# Patient Record
Sex: Female | Born: 1945
Health system: Southern US, Community
[De-identification: ages and names within clinical notes are randomized; demographics above are authoritative.]

## PROBLEM LIST (undated history)

## (undated) DIAGNOSIS — M1990 Unspecified osteoarthritis, unspecified site: Secondary | ICD-10-CM

## (undated) DIAGNOSIS — R000 Tachycardia, unspecified: Secondary | ICD-10-CM

## (undated) DIAGNOSIS — I491 Atrial premature depolarization: Secondary | ICD-10-CM

## (undated) DIAGNOSIS — Z8679 Personal history of other diseases of the circulatory system: Secondary | ICD-10-CM

## (undated) DIAGNOSIS — I509 Heart failure, unspecified: Secondary | ICD-10-CM

## (undated) DIAGNOSIS — I471 Supraventricular tachycardia, unspecified: Secondary | ICD-10-CM

## (undated) DIAGNOSIS — N179 Acute kidney failure, unspecified: Secondary | ICD-10-CM

## (undated) DIAGNOSIS — R002 Palpitations: Secondary | ICD-10-CM

## (undated) DIAGNOSIS — R011 Cardiac murmur, unspecified: Secondary | ICD-10-CM

## (undated) DIAGNOSIS — I10 Essential (primary) hypertension: Secondary | ICD-10-CM

## (undated) DIAGNOSIS — R609 Edema, unspecified: Secondary | ICD-10-CM

## (undated) DIAGNOSIS — I493 Ventricular premature depolarization: Secondary | ICD-10-CM

## (undated) DIAGNOSIS — E119 Type 2 diabetes mellitus without complications: Secondary | ICD-10-CM

## (undated) DIAGNOSIS — J189 Pneumonia, unspecified organism: Secondary | ICD-10-CM

## (undated) DIAGNOSIS — K219 Gastro-esophageal reflux disease without esophagitis: Secondary | ICD-10-CM

## (undated) DIAGNOSIS — I350 Nonrheumatic aortic (valve) stenosis: Secondary | ICD-10-CM

## (undated) HISTORY — DX: Cardiac murmur, unspecified: R01.1

## (undated) HISTORY — DX: Supraventricular tachycardia, unspecified: I47.10

## (undated) HISTORY — DX: Unspecified osteoarthritis, unspecified site: M19.90

## (undated) HISTORY — DX: Supraventricular tachycardia: I47.1

## (undated) HISTORY — DX: Edema, unspecified: R60.9

## (undated) HISTORY — DX: Atrial premature depolarization: I49.1

## (undated) HISTORY — DX: Tachycardia, unspecified: R00.0

## (undated) HISTORY — DX: Palpitations: R00.2

## (undated) HISTORY — DX: Personal history of other diseases of the circulatory system: Z86.79

## (undated) HISTORY — DX: Essential (primary) hypertension: I10

## (undated) HISTORY — DX: Ventricular premature depolarization: I49.3

## (undated) HISTORY — DX: Morbid (severe) obesity due to excess calories: E66.01

## (undated) HISTORY — DX: Nonrheumatic aortic (valve) stenosis: I35.0

---

## 2000-06-11 ENCOUNTER — Emergency Department (HOSPITAL_COMMUNITY): Admission: EM | Admit: 2000-06-11 | Discharge: 2000-06-11 | Payer: Self-pay | Admitting: Emergency Medicine

## 2001-02-21 ENCOUNTER — Emergency Department (HOSPITAL_COMMUNITY): Admission: EM | Admit: 2001-02-21 | Discharge: 2001-02-21 | Payer: Self-pay

## 2001-12-08 ENCOUNTER — Encounter: Payer: Self-pay | Admitting: Emergency Medicine

## 2001-12-08 ENCOUNTER — Emergency Department (HOSPITAL_COMMUNITY): Admission: EM | Admit: 2001-12-08 | Discharge: 2001-12-08 | Payer: Self-pay | Admitting: Emergency Medicine

## 2002-10-03 ENCOUNTER — Encounter: Admission: RE | Admit: 2002-10-03 | Discharge: 2002-10-03 | Payer: Self-pay | Admitting: Orthopaedic Surgery

## 2006-09-09 ENCOUNTER — Emergency Department (HOSPITAL_COMMUNITY): Admission: EM | Admit: 2006-09-09 | Discharge: 2006-09-10 | Payer: Self-pay | Admitting: Emergency Medicine

## 2006-12-07 ENCOUNTER — Emergency Department (HOSPITAL_COMMUNITY): Admission: EM | Admit: 2006-12-07 | Discharge: 2006-12-07 | Payer: Self-pay | Admitting: Emergency Medicine

## 2006-12-17 ENCOUNTER — Emergency Department (HOSPITAL_COMMUNITY): Admission: EM | Admit: 2006-12-17 | Discharge: 2006-12-18 | Payer: Self-pay | Admitting: Emergency Medicine

## 2006-12-17 IMAGING — CT CT ABDOMEN W/ CM
4 of 9 series · 12 of 46 positions shown, 18 images · IV contrast (APPLIED)
Comparison: none

HISTORY: Left-sided abdominal pain, back pain

[Series 2: abd_pel 5.0 b40f st · axial · 0.73mm/px · z∈[+1130,+1364]mm · 3 of 95 slices shown, 7 images]
[im 24/95  soft-tissue]
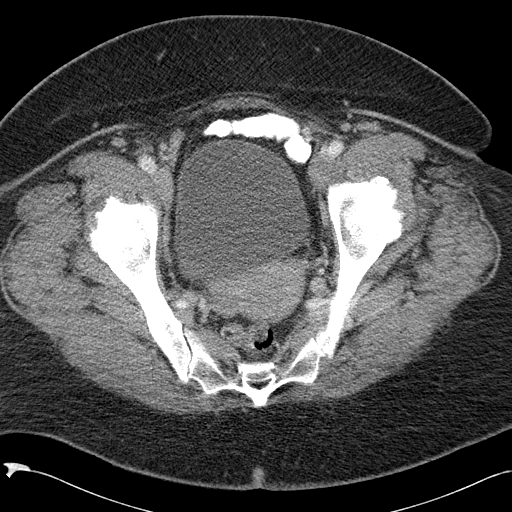
[im 24/95  lung]
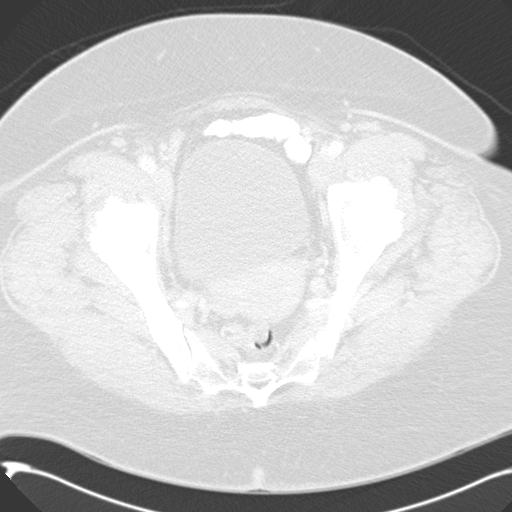
[im 24/95  bone]
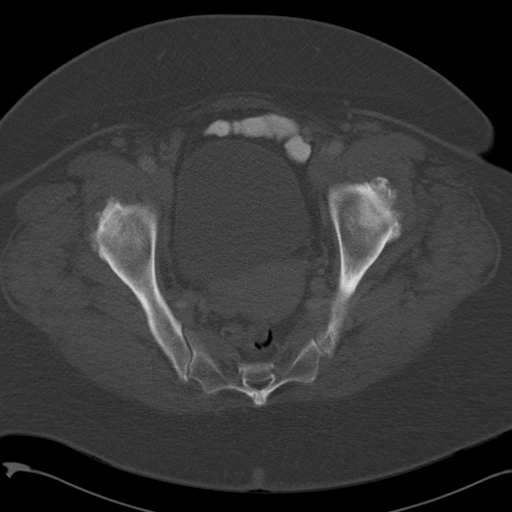
[im 48/95  soft-tissue]
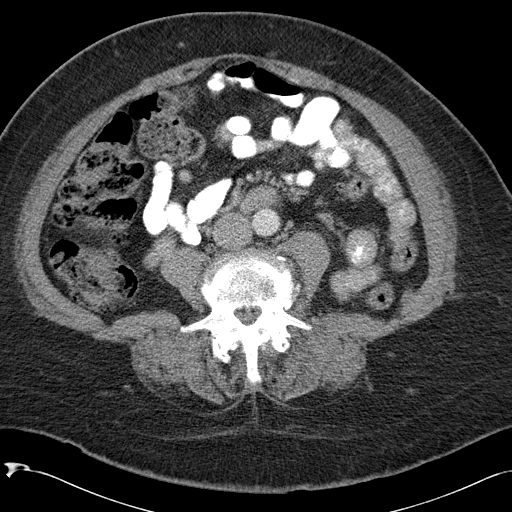
[im 48/95  lung]
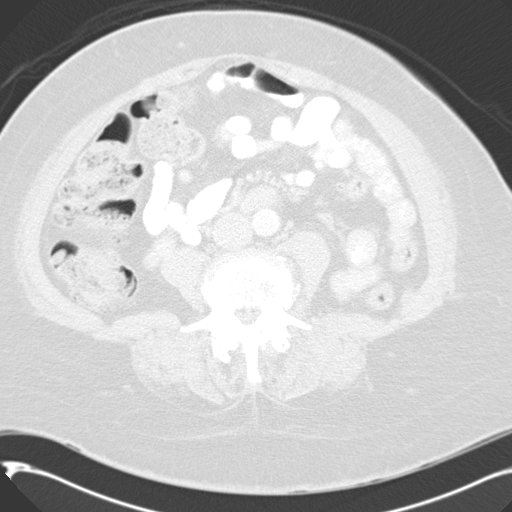
[im 71/95  soft-tissue]
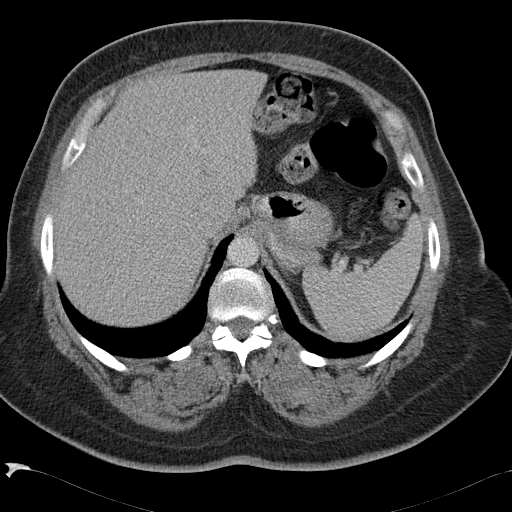
[im 71/95  lung]
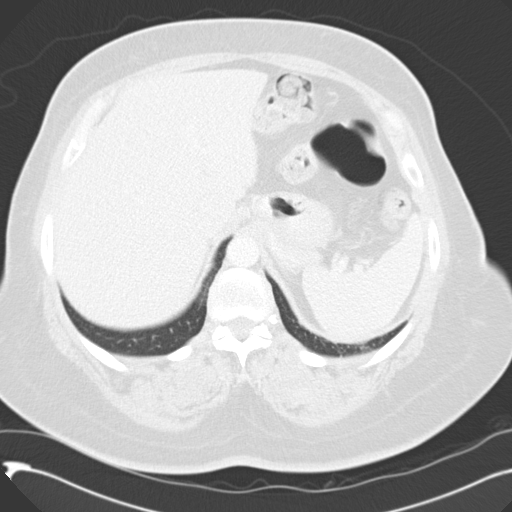

[Series 6: thin sections 2.0 b40f st · axial · 0.27mm/px · z∈[+1124,+1242]mm · 5 of 382 slices shown]
[im 43/382  soft-tissue]
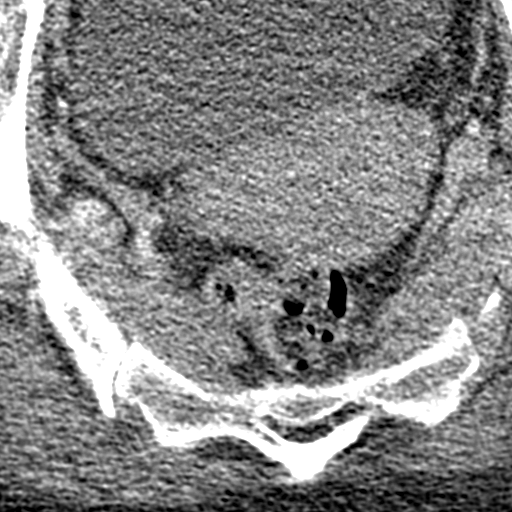
[im 85/382  soft-tissue]
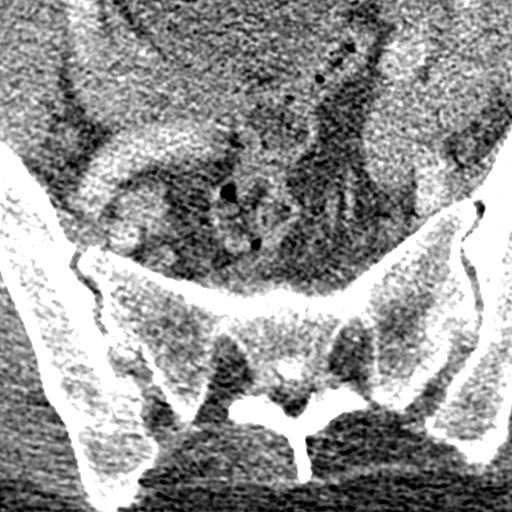
[im 128/382  soft-tissue]
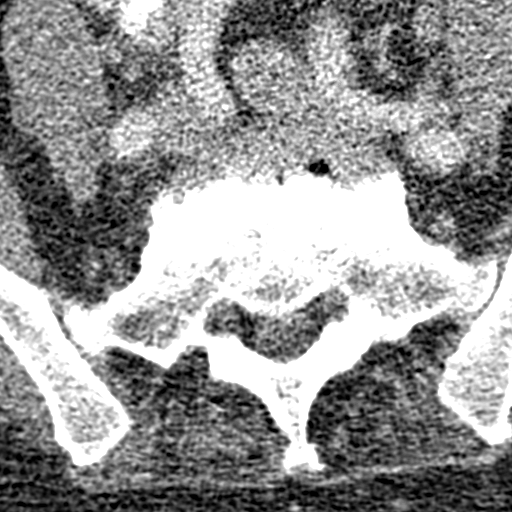
[im 170/382  soft-tissue]
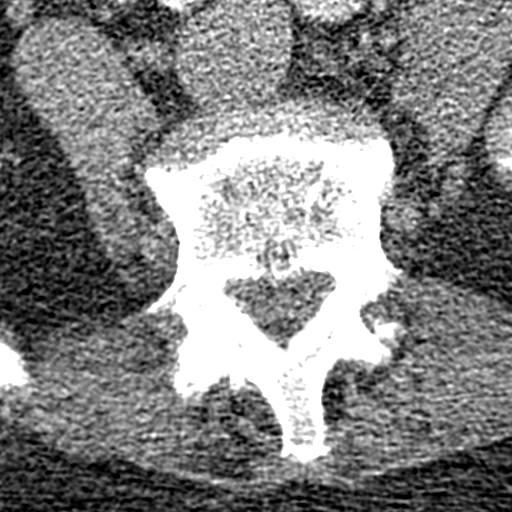
[im 212/382  soft-tissue]
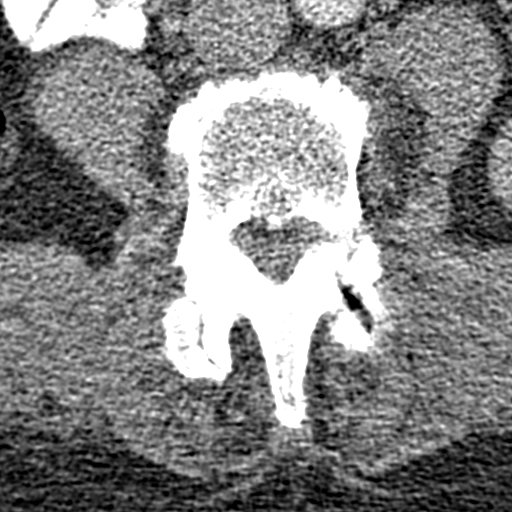

[Series 604: <mpr range> · coronal · 0.96mm/px · 3 of 136 slices shown, 4 images]
[im 46/136  soft-tissue]
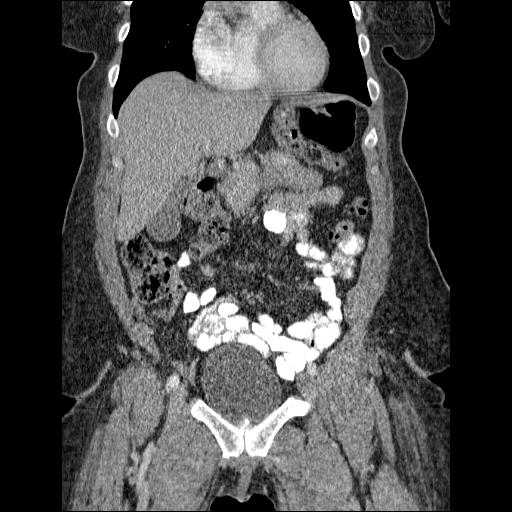
[im 68/136  soft-tissue]
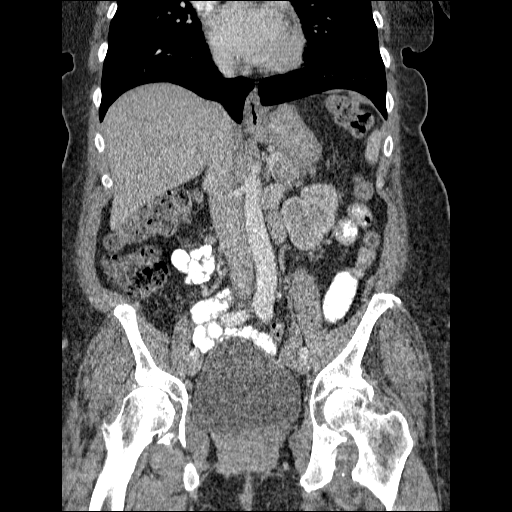
[im 68/136  bone]
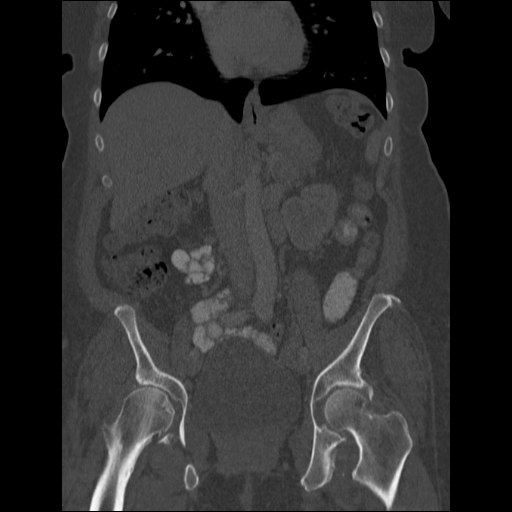
[im 91/136  soft-tissue]
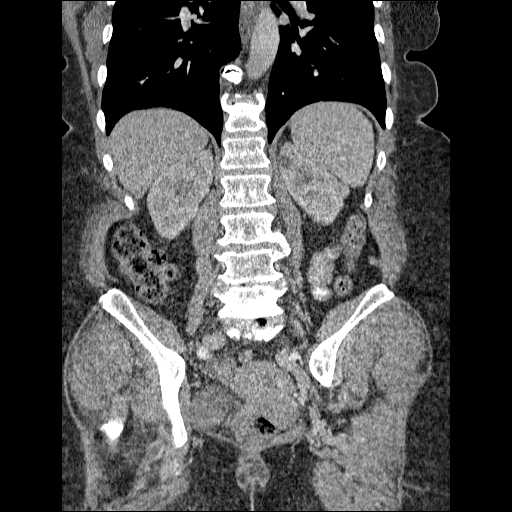

[Series 605: <mpr range(1)> · sagittal · 0.96mm/px · 1 of 163 slices shown, 2 images]
[im 82/163  soft-tissue]
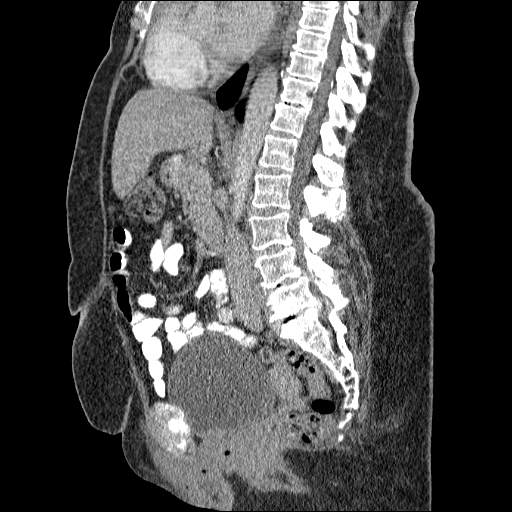
[im 82/163  bone]
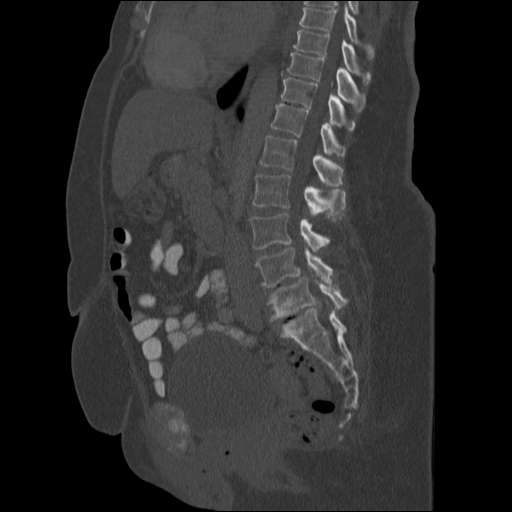

[12 of 46 positions shown; findings below may reference images not displayed]

CT ABDOMEN AND PELVIS WITH CONTRAST:

Multidetector helical CT imaging abdomen and pelvis performed.
Sagittal and coronal images are reconstructed from the axial data set.
Exam utilized dilute oral contrast and 125 cc [IO].
No prior exams for comparison.

CT ABDOMEN:

Lung bases clear.
Question small cyst upper pole left kidney, 1.6 x 1.4 cm delayed image 5.
Remainder of liver, spleen, pancreas, kidneys, and adrenal glands normal.
No upper abdominal mass, adenopathy, free fluid, or inflammatory process.
Suboptimal image quality due to patient size.
No definite acute upper abdominal abnormality identified.
IMPRESSION: Probable small left renal cyst.
No definite acute upper abdominal abnormalities.

CT PELVIS:

Well-distended normal appearing bladder.
Small pelvic phleboliths.
No definite ureteral calcification, dilatation, or hydronephrosis.
Large and small bowel loops in pelvis normal.
Appendix not identified.
Enthesopathy at RTOYOTA bilaterally.
IMPRESSION: No acute intra pelvic abnormality.

CT LUMBAR SPINE WITHOUT CONTRAST:

From initial acquisition from CT abdomen and pelvis, lumbar reconstructions were
performed, including axial images through the discs and both sagittal and
coronal images.

Lumbar vertebra normal in height.
Disc space narrowing at L5-S1, L4-L5, L3-L4.
End plate spurs at above levels.
Vacuum phenomenon L5-S1.
Minimal anterolisthesis L4-L5 likely secondary to facet degenerative changes.
No definite spondylolysis or bone destruction.
Gas identified at left L4-L5 facet joint.
SI joints symmetric.
Bilateral neural foraminal narrowing at L5-S1.
Additional abnormalities as below:

L1-L2: No disc herniation or neural compression. Foramina patent.

L2-L3: Mild disc bulge. Facet hypertrophy. No definite disc herniation or neural
compression.

L3-L4: Bulky left lateral spur from vertebral endplates. Mild disc bulge. Facet
hypertrophy. AP narrowing spinal canal, multifactorial. Slight narrowing of
lateral recesses primarily by facet hypertrophy.

L4-L5: Large left posterolateral disc herniation extending through left neural
foramen, likely exerting significant mass-effect upon exiting left L4 root. AP
narrowing spinal canal. Bilateral lateral recess narrowing. Diffuse disc bulge
of remainder of L4-L5 disc contributes right neural foraminal narrowing.

L5-S1: Degenerative disc disease with posterior disc herniation, extending into
left lateral recess and medial aspect of left neural foramen, suspect exerting
mass-effect upon left S1 root. Left greater than right foramina are stenotic and
there may be compression of exiting bilateral L5 roots bilaterally.
IMPRESSION: Severe multilevel degenerative disc and facet disease changes of lumbar spine
with areas of neural foraminal stenosis and disc herniation as above.

## 2008-07-25 HISTORY — PX: US ECHOCARDIOGRAPHY: HXRAD669

## 2008-10-16 ENCOUNTER — Emergency Department (HOSPITAL_COMMUNITY): Admission: EM | Admit: 2008-10-16 | Discharge: 2008-10-16 | Payer: Self-pay | Admitting: Family Medicine

## 2008-10-16 IMAGING — CR DG KNEE 1-2V*R*
2 series · 2 of 2 positions shown · non-contrast
Comparison: None

CLINICAL DATA: Knee pain and swelling

RIGHT KNEE - 1-2 VIEW

[view not recorded (1 of 2)]
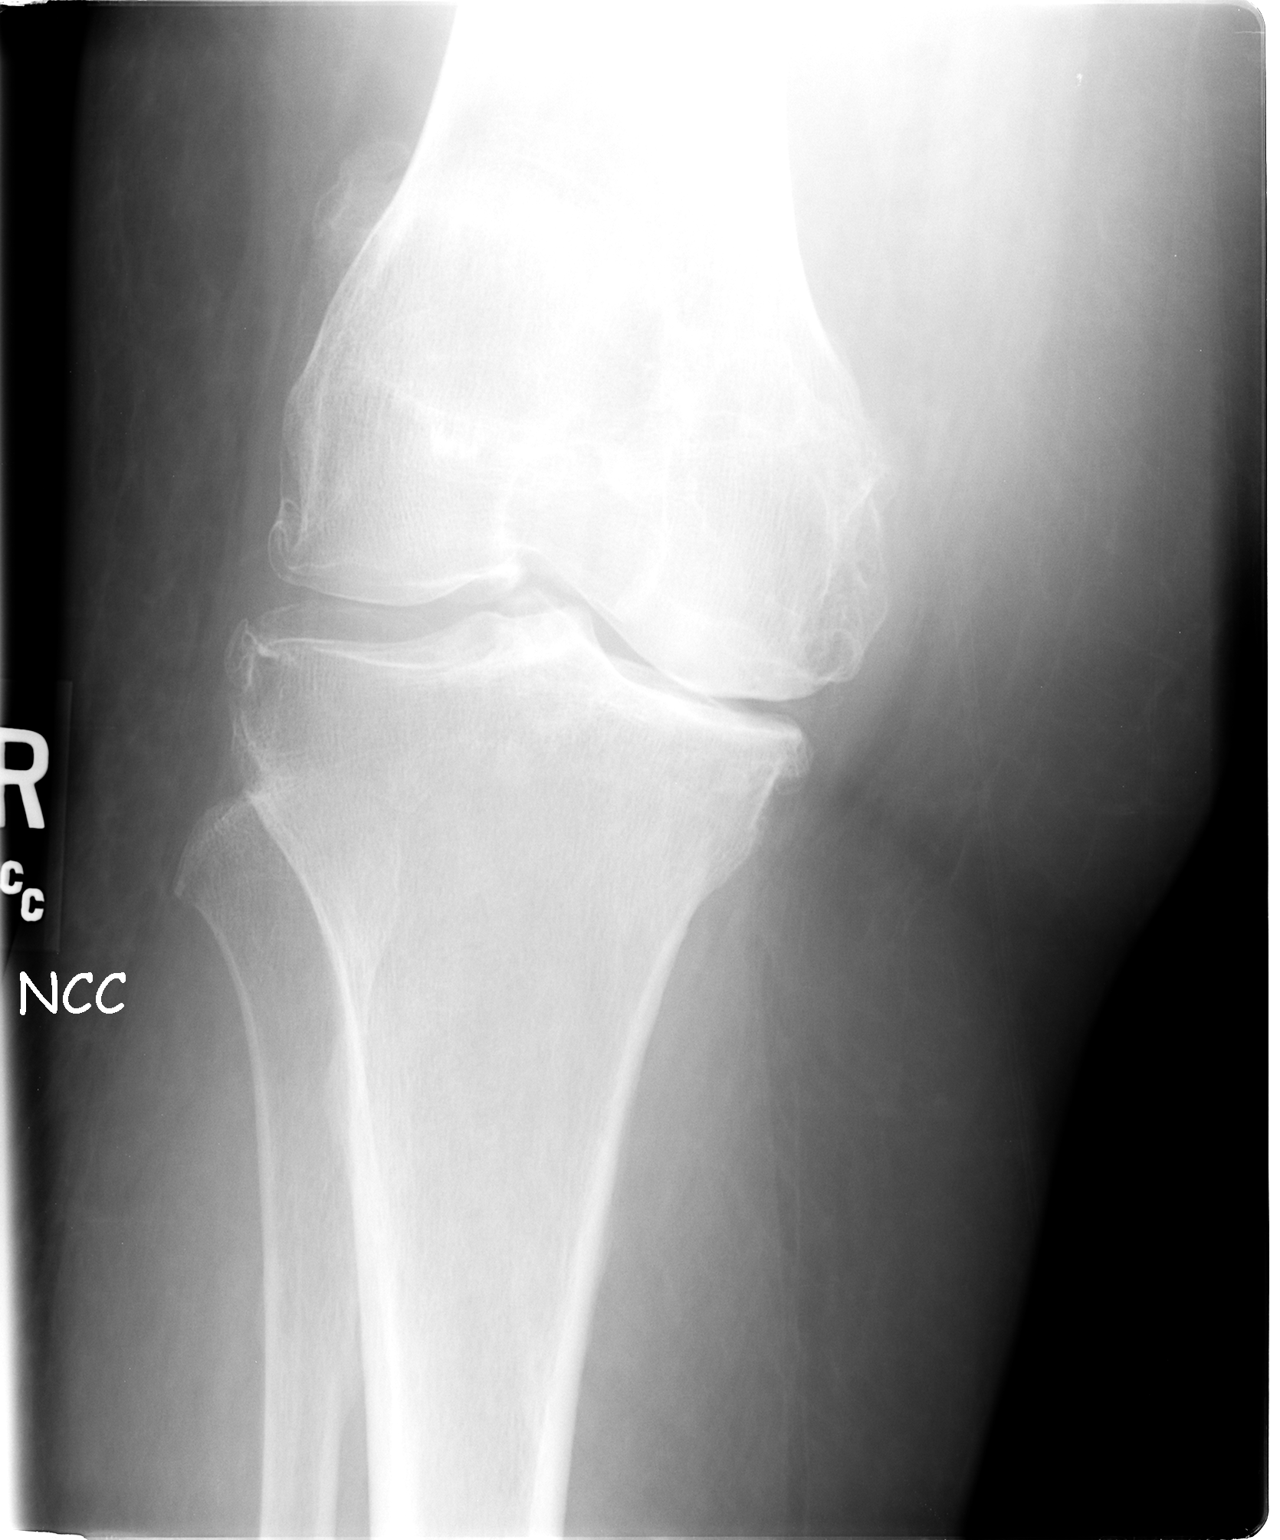

[view not recorded (2 of 2)]
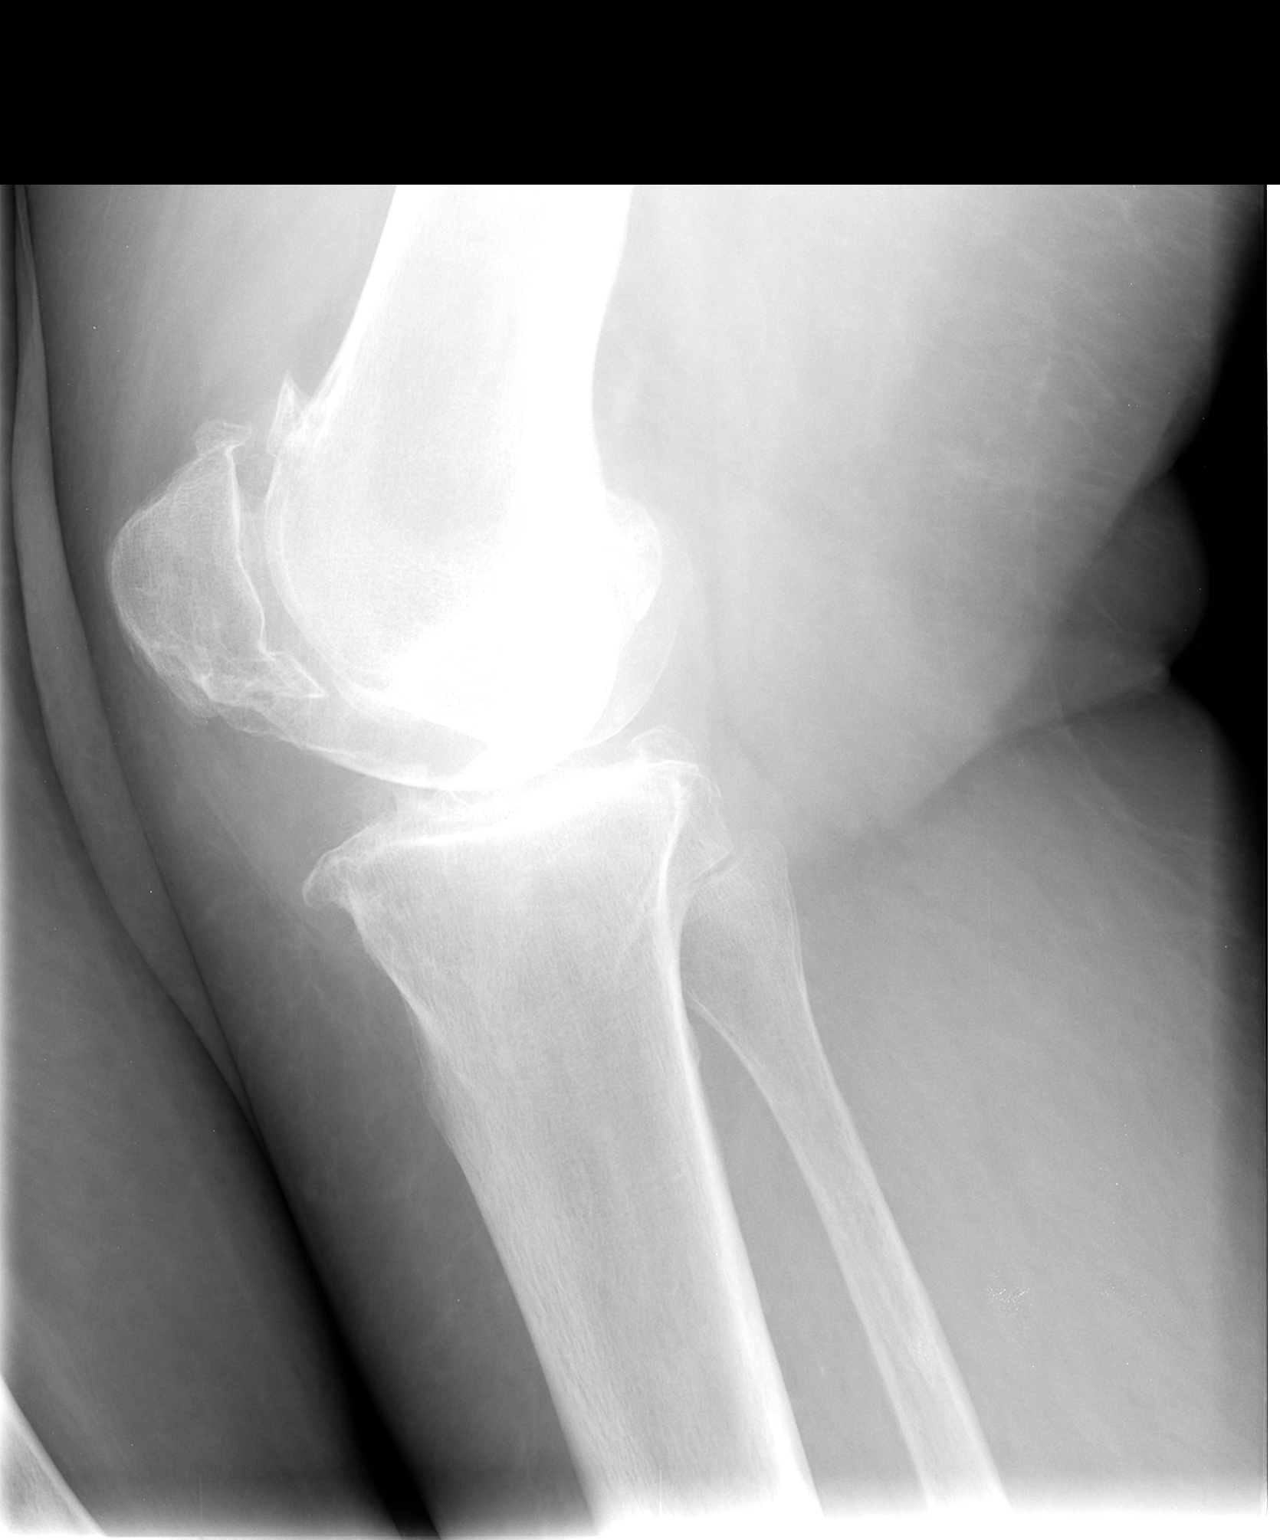

[2 of 2 positions shown; findings below may reference images not displayed]

FINDINGS: There is marked narrowing of the medial compartment.
This osteophytosis of the lateral and medial compartment.  There is
spurring of the patella.  Small subpatellar joint effusion.
IMPRESSION: 1.  No evidence of acute fracture.
2.  Tricompartment osteoarthritis with marked medial compartment
narrowing.
3.  Small suprapatellar joint effusion.

## 2008-10-16 IMAGING — CR DG KNEE 1-2V*L*
2 series · 2 of 2 positions shown · non-contrast
Comparison: None

CLINICAL DATA: Knee swelling

LEFT KNEE - 1-2 VIEW

[view not recorded (1 of 2)]
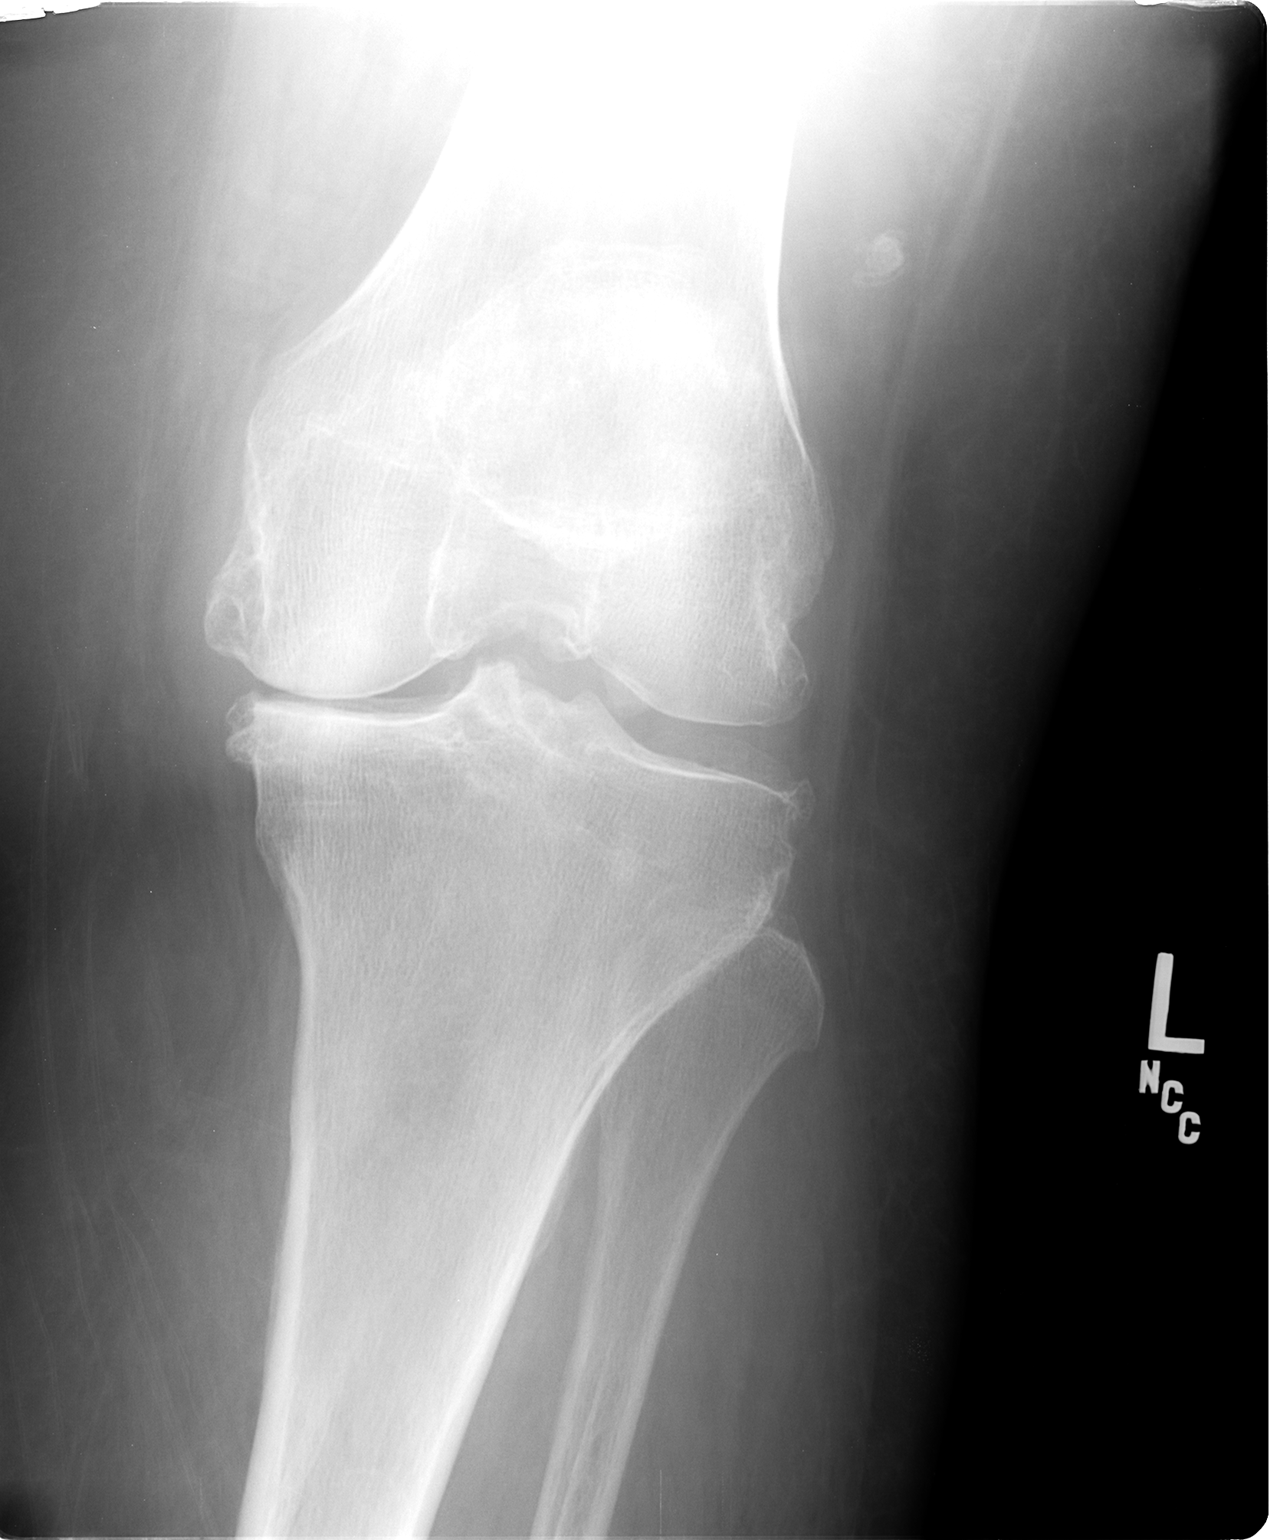

[view not recorded (2 of 2)]
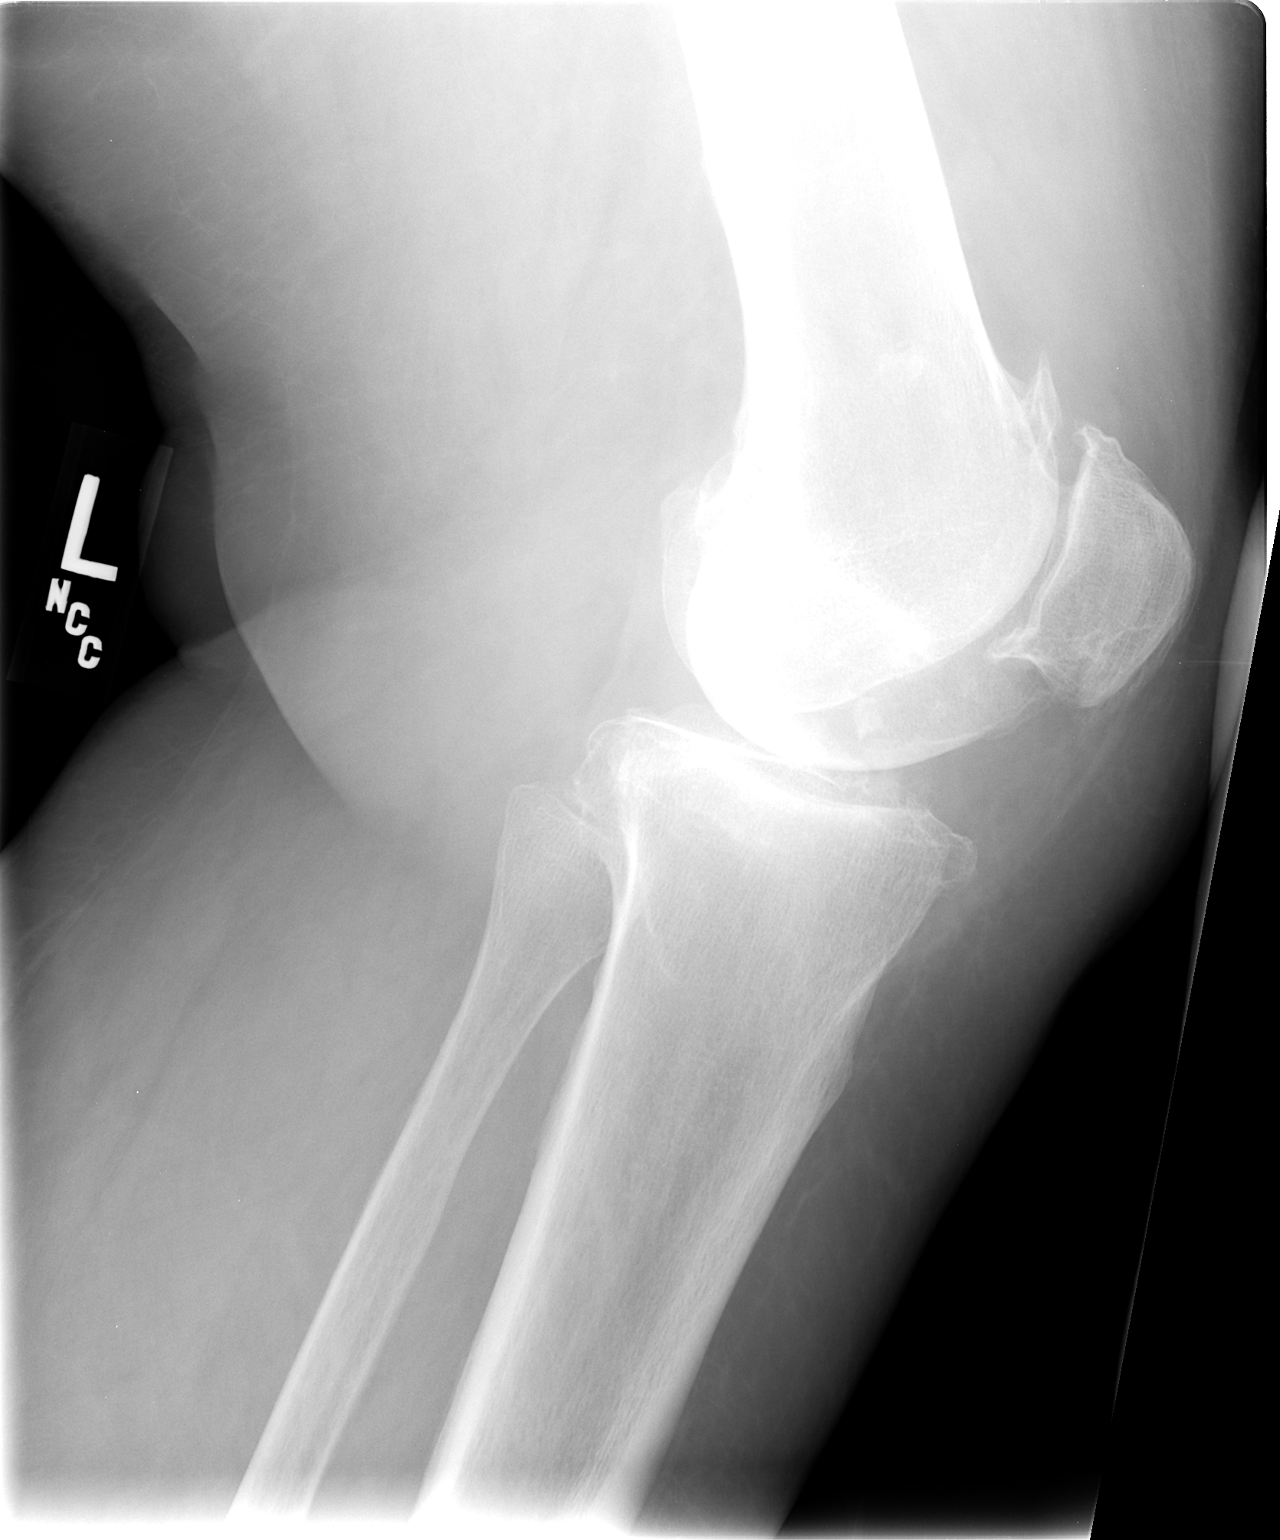

[2 of 2 positions shown; findings below may reference images not displayed]

FINDINGS: No evidence of acute fracture or dislocation of the left
knee.  There is significant medial joint space narrowing with
associated osteophytosis.  There is osteophytosis of the patella on
the inferior and superior borders.  Small suprapatellar joint
effusion.
IMPRESSION: 1.  No evidence of acute fracture.
2.  Tricompartment osteoarthritis with marked medial compartment
narrowing.
3.  Small suprapatellar joint effusion.

## 2009-05-24 ENCOUNTER — Emergency Department (HOSPITAL_COMMUNITY): Admission: EM | Admit: 2009-05-24 | Discharge: 2009-05-24 | Payer: Self-pay | Admitting: Family Medicine

## 2009-05-24 IMAGING — CR DG CHEST 2V
2 series · 2 of 2 positions shown · non-contrast
Comparison: None.

CLINICAL DATA: Cough, congestion, right shoulder pain.

CHEST - 2 VIEW

[view not recorded (1 of 2)]
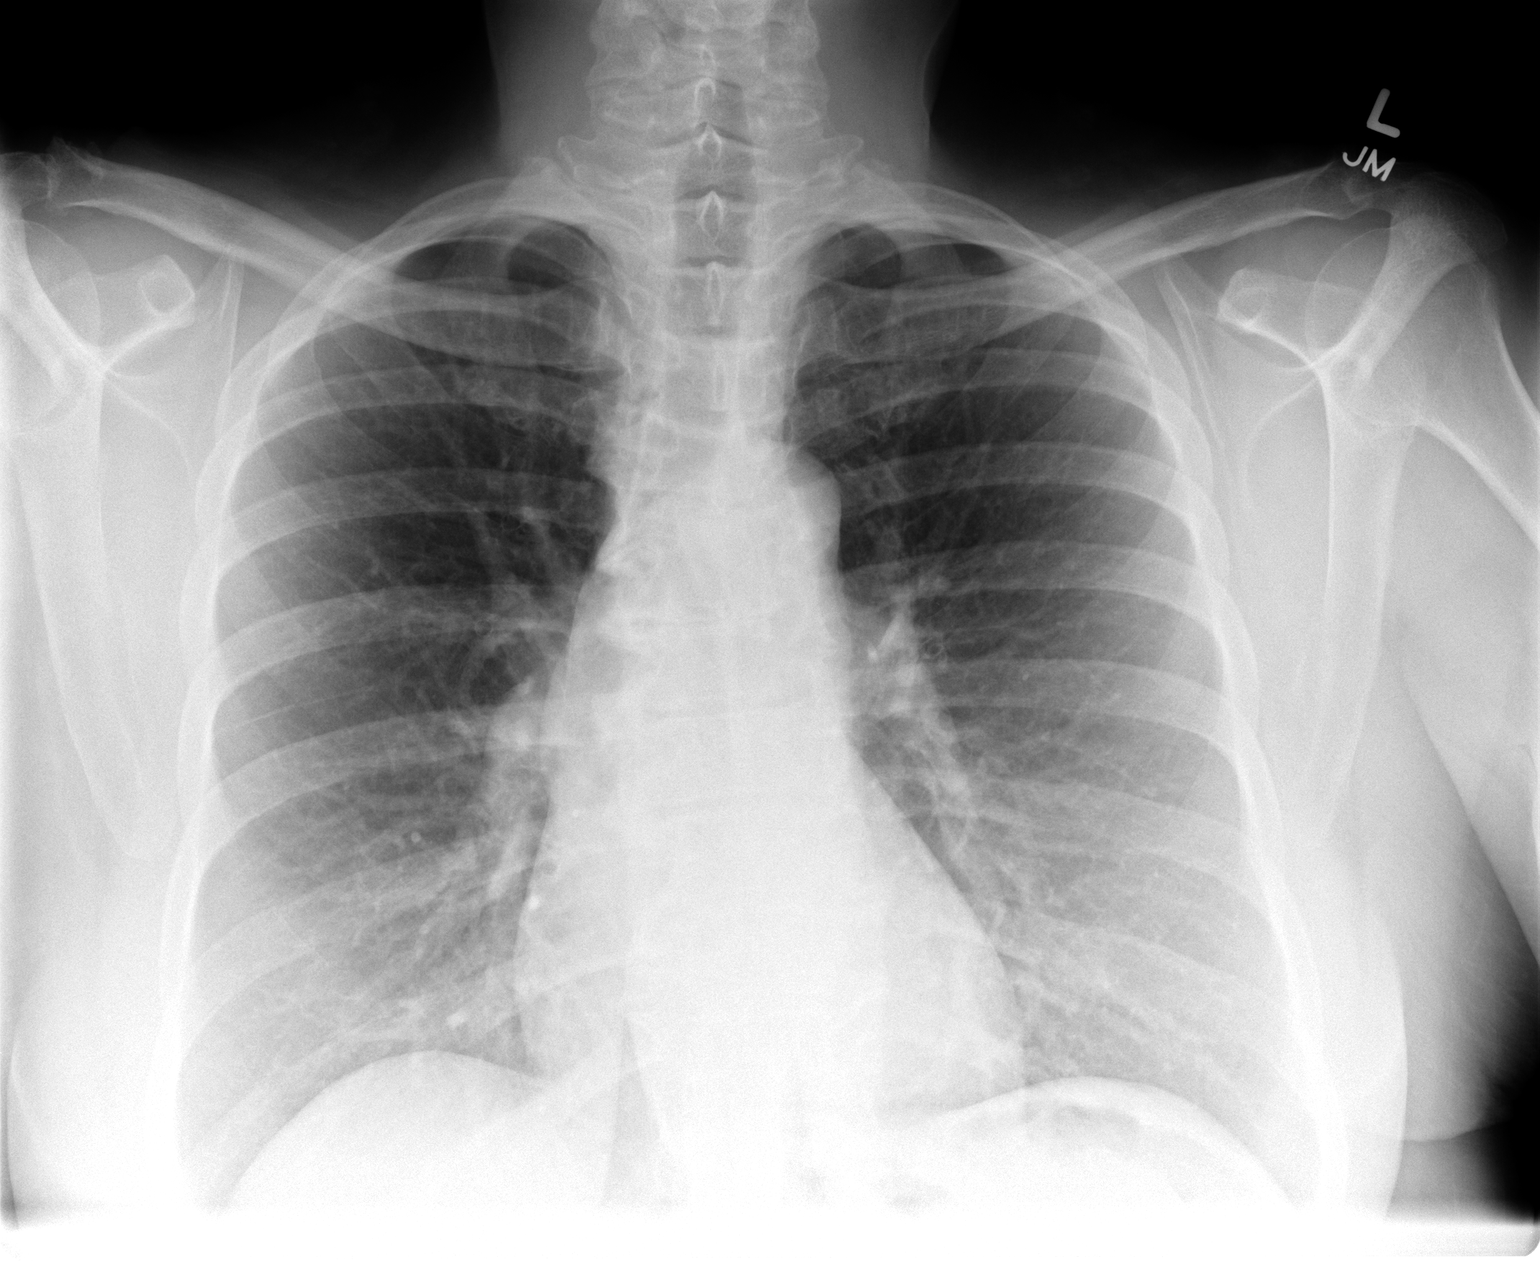

[view not recorded (2 of 2)]
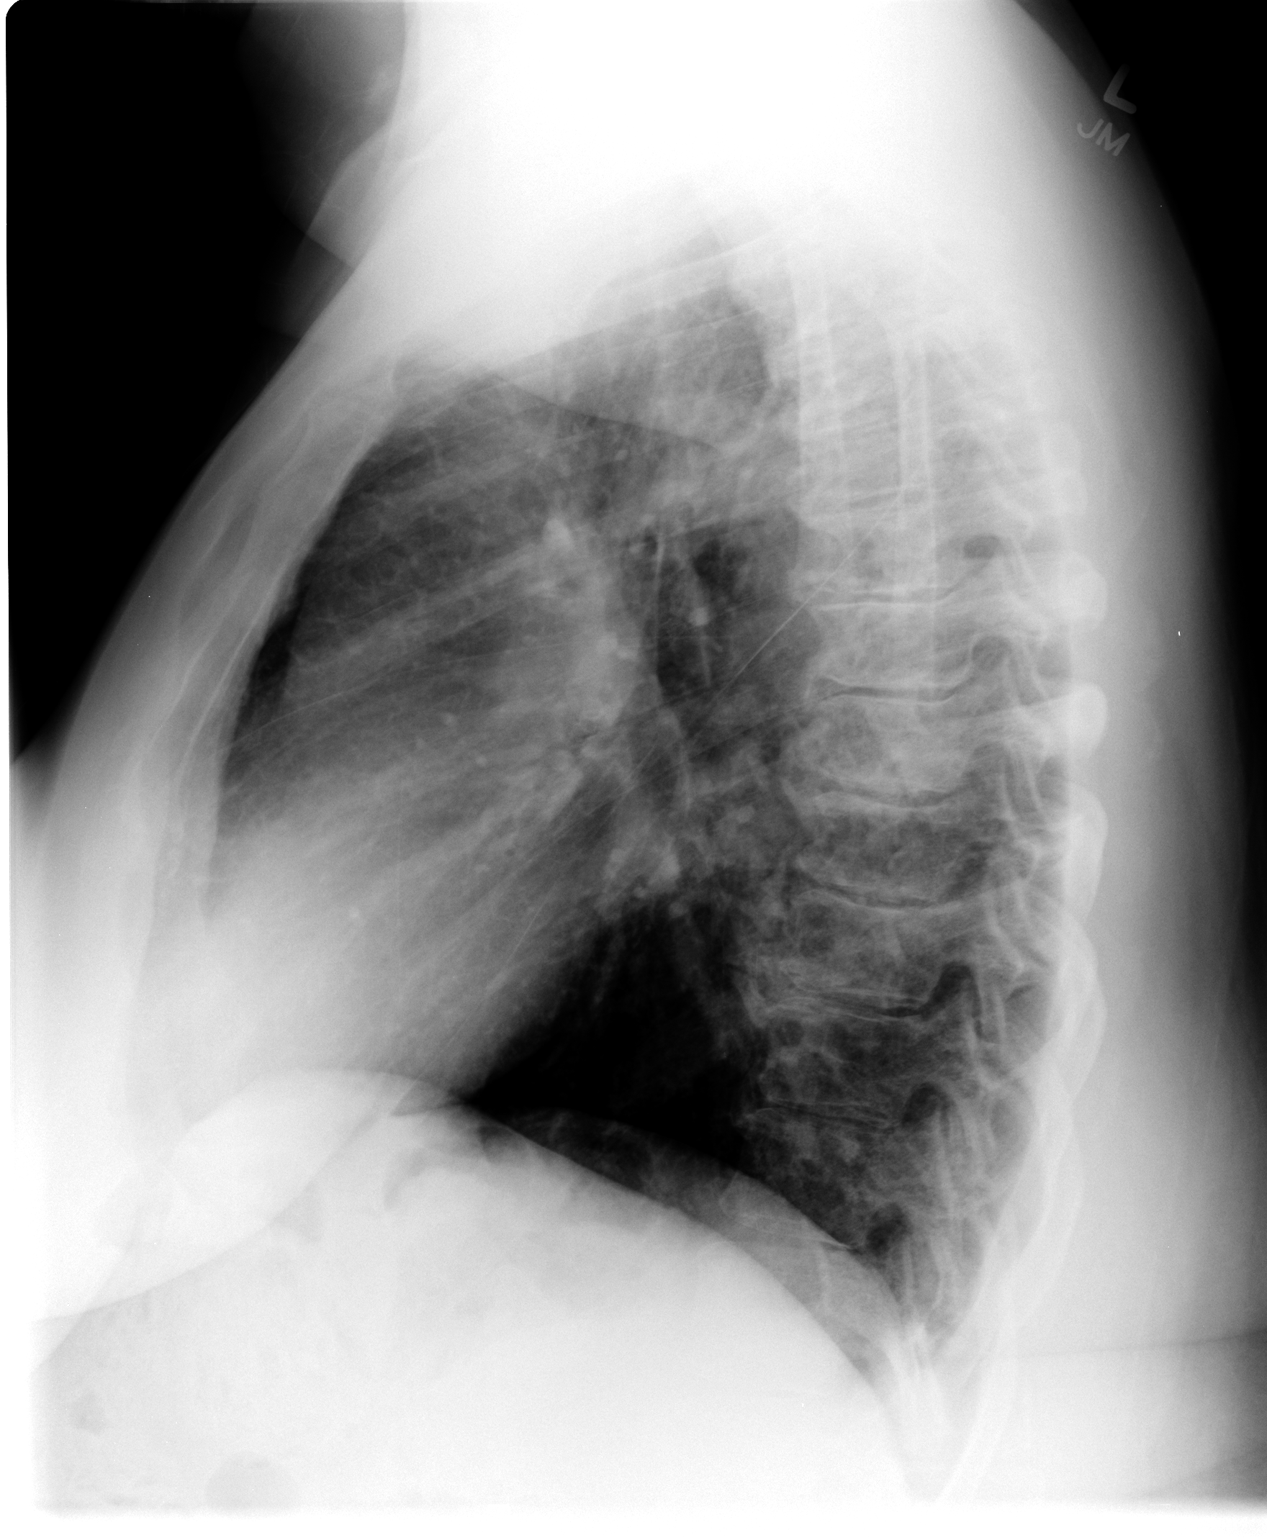

[2 of 2 positions shown; findings below may reference images not displayed]

FINDINGS: Trachea is midline.  Heart size normal.  Lungs are clear.
No pleural fluid.
IMPRESSION: No acute findings.

## 2010-07-13 LAB — POCT I-STAT, CHEM 8
BUN: 11 mg/dL (ref 6–23)
Calcium, Ion: 1.22 mmol/L (ref 1.12–1.32)
Chloride: 107 mEq/L (ref 96–112)
Creatinine, Ser: 0.7 mg/dL (ref 0.4–1.2)
Glucose, Bld: 88 mg/dL (ref 70–99)
HCT: 41 % (ref 36.0–46.0)
Hemoglobin: 13.9 g/dL (ref 12.0–15.0)
Potassium: 3.8 mEq/L (ref 3.5–5.1)
Sodium: 143 mEq/L (ref 135–145)
TCO2: 27 mmol/L (ref 0–100)

## 2010-11-24 ENCOUNTER — Encounter: Payer: Self-pay | Admitting: Cardiology

## 2010-12-10 ENCOUNTER — Ambulatory Visit: Payer: Self-pay | Admitting: Cardiology

## 2011-01-12 ENCOUNTER — Ambulatory Visit: Payer: Self-pay | Admitting: Cardiology

## 2011-01-16 LAB — CBC
HCT: 34.8 — ABNORMAL LOW
HCT: 38.7
Hemoglobin: 11.7 — ABNORMAL LOW
Hemoglobin: 12.9
MCHC: 33.7
MCHC: 33.3
MCV: 81.9
MCV: 82.6
Platelets: ADEQUATE
Platelets: 255
RBC: 4.26
RBC: 4.69
RDW: 13.6
RDW: 14.5 — ABNORMAL HIGH
WBC: 7.1
WBC: 7.6

## 2011-01-16 LAB — COMPREHENSIVE METABOLIC PANEL
ALT: 14
AST: 29
Albumin: 3.4 — ABNORMAL LOW
Alkaline Phosphatase: 49
BUN: 5 — ABNORMAL LOW
CO2: 24
Calcium: 9.4
Chloride: 106
Creatinine, Ser: 0.71
GFR calc Af Amer: >60
GFR calc non Af Amer: >60
Glucose, Bld: 90
Potassium: 3.7
Sodium: 141
Total Bilirubin: 1.5 — ABNORMAL HIGH
Total Protein: 7.2

## 2011-01-16 LAB — URINALYSIS, ROUTINE W REFLEX MICROSCOPIC
Bilirubin Urine: NEGATIVE
Bilirubin Urine: NEGATIVE
Glucose, UA: NEGATIVE
Glucose, UA: NEGATIVE
Hgb urine dipstick: NEGATIVE
Ketones, ur: NEGATIVE
Ketones, ur: NEGATIVE
Nitrite: NEGATIVE
Nitrite: POSITIVE — AB
Protein, ur: NEGATIVE
Protein, ur: NEGATIVE
Specific Gravity, Urine: 1.026
Specific Gravity, Urine: 1.026
Urobilinogen, UA: 0.2
Urobilinogen, UA: 0.2
pH: 5.5
pH: 5

## 2011-01-16 LAB — URINE CULTURE
Colony Count: 40000
Colony Count: >=100000

## 2011-01-16 LAB — DIFFERENTIAL
Basophils Absolute: 0
Basophils Absolute: 0
Basophils Relative: 1
Basophils Relative: 0
Eosinophils Absolute: 0.1
Eosinophils Absolute: 0
Eosinophils Relative: 1
Eosinophils Relative: 0
Lymphocytes Relative: 32
Lymphocytes Relative: 18
Lymphs Abs: 2.3
Lymphs Abs: 1.3
Monocytes Absolute: 0.7
Monocytes Absolute: 0.8 — ABNORMAL HIGH
Monocytes Relative: 10
Monocytes Relative: 10
Neutro Abs: 4
Neutro Abs: 5.4
Neutrophils Relative %: 56
Neutrophils Relative %: 72

## 2011-01-16 LAB — URINE MICROSCOPIC-ADD ON

## 2011-01-16 LAB — BASIC METABOLIC PANEL
BUN: 9
CO2: 26
Calcium: 9.6
Chloride: 106
Creatinine, Ser: 0.73
GFR calc Af Amer: >60
GFR calc non Af Amer: >60
Glucose, Bld: 107 — ABNORMAL HIGH
Potassium: 3.6
Sodium: 140

## 2011-01-20 ENCOUNTER — Ambulatory Visit (INDEPENDENT_AMBULATORY_CARE_PROVIDER_SITE_OTHER): Payer: Self-pay | Admitting: Cardiology

## 2011-01-20 ENCOUNTER — Encounter: Payer: Self-pay | Admitting: Cardiology

## 2011-01-20 DIAGNOSIS — R609 Edema, unspecified: Secondary | ICD-10-CM

## 2011-01-20 DIAGNOSIS — I10 Essential (primary) hypertension: Secondary | ICD-10-CM | POA: Insufficient documentation

## 2011-01-20 DIAGNOSIS — R079 Chest pain, unspecified: Secondary | ICD-10-CM | POA: Insufficient documentation

## 2011-01-20 DIAGNOSIS — I872 Venous insufficiency (chronic) (peripheral): Secondary | ICD-10-CM

## 2011-01-20 DIAGNOSIS — I878 Other specified disorders of veins: Secondary | ICD-10-CM

## 2011-01-20 LAB — HEPATIC FUNCTION PANEL
ALT: 15 U/L (ref 0–35)
AST: 20 U/L (ref 0–37)
Albumin: 4.1 g/dL (ref 3.5–5.2)
Alkaline Phosphatase: 61 U/L (ref 39–117)
Bilirubin, Direct: 0.1 mg/dL (ref 0.0–0.3)
Total Bilirubin: 0.5 mg/dL (ref 0.3–1.2)
Total Protein: 8 g/dL (ref 6.0–8.3)

## 2011-01-20 LAB — CBC WITH DIFFERENTIAL/PLATELET
Basophils Absolute: 0 10*3/uL (ref 0.0–0.1)
Basophils Relative: 0.6 % (ref 0.0–3.0)
Eosinophils Absolute: 0.2 10*3/uL (ref 0.0–0.7)
Eosinophils Relative: 3.3 % (ref 0.0–5.0)
HCT: 37.5 % (ref 36.0–46.0)
Hemoglobin: 12.5 g/dL (ref 12.0–15.0)
Lymphocytes Relative: 25.8 % (ref 12.0–46.0)
Lymphs Abs: 1.4 10*3/uL (ref 0.7–4.0)
MCHC: 33.3 g/dL (ref 30.0–36.0)
MCV: 83.4 fl (ref 78.0–100.0)
Monocytes Absolute: 0.3 10*3/uL (ref 0.1–1.0)
Monocytes Relative: 6.3 % (ref 3.0–12.0)
Neutro Abs: 3.4 10*3/uL (ref 1.4–7.7)
Neutrophils Relative %: 64 % (ref 43.0–77.0)
Platelets: 210 10*3/uL (ref 150.0–400.0)
RBC: 4.5 Mil/uL (ref 3.87–5.11)
RDW: 16.1 % — ABNORMAL HIGH (ref 11.5–14.6)
WBC: 5.4 10*3/uL (ref 4.5–10.5)

## 2011-01-20 LAB — BASIC METABOLIC PANEL
BUN: 10 mg/dL (ref 6–23)
CO2: 25 mEq/L (ref 19–32)
Calcium: 9.3 mg/dL (ref 8.4–10.5)
Chloride: 110 mEq/L (ref 96–112)
Creatinine, Ser: 0.8 mg/dL (ref 0.4–1.2)
GFR: 90.18 mL/min (ref >60.00)
Glucose, Bld: 106 mg/dL — ABNORMAL HIGH (ref 70–99)
Potassium: 3.5 mEq/L (ref 3.5–5.1)
Sodium: 142 mEq/L (ref 135–145)

## 2011-01-20 LAB — LIPID PANEL
Cholesterol: 167 mg/dL (ref 0–200)
HDL: 55 mg/dL (ref >39.00)
LDL Cholesterol: 100 mg/dL — ABNORMAL HIGH (ref 0–99)
Total CHOL/HDL Ratio: 3
Triglycerides: 62 mg/dL (ref 0.0–149.0)
VLDL: 12.4 mg/dL (ref 0.0–40.0)

## 2011-01-20 MED ORDER — FUROSEMIDE 40 MG PO TABS: 40.0000 mg | ORAL_TABLET | ORAL | Status: DC | PRN

## 2011-01-20 NOTE — Assessment & Plan Note (Signed)
She does have some atypical chest pain symptoms. We discussed further evaluation with stress testing or even an ECG. She is really not interested in pursuing this at this time. She states she has no insurance and can't afford further testing.

## 2011-01-20 NOTE — Patient Instructions (Addendum)
Take your Lasix daily.  We will check blood work today.  I will have your return in 3 weeks to see Lawson Fiscal, my nurse practitioner.  Tuesday, Nov 6 at 9:30  Check your blood pressure at home if you can and bring a list of your readings to your next visit.  Stay off meloxicam.

## 2011-01-20 NOTE — Assessment & Plan Note (Addendum)
She has no prior history of hypertension but her blood pressure is significantly elevated today. I recommended sodium restriction. We will put her back on her Lasix. I'll have her return in 3 weeks to reassess her blood pressure and if it remains elevated she may need additional therapy. I think it would be a good idea for her to get a primary care doctor.

## 2011-01-20 NOTE — Assessment & Plan Note (Signed)
She has severe chronic edema with severe skin changes. She is going to resume her Lasix therapy. I recommended that she stop taking meloxicam. We will obtain baseline lab work today. I think the only other thing we can offer her would be ACE wraps of her lower extremities to help reduce their size.

## 2011-01-20 NOTE — Progress Notes (Signed)
   [name] [name] Date of Birth: [birth year] Medical Record #[id number]  History of Present Illness: [name] [name] is seen today for followup evaluation. She was last seen a year and half ago. She has a history of chronic venous stasis disease with edema. She also has history of PACs and PVCs. She does complain a lot of fluids in her legs. She reports that she has not been taking Lasix as she should. She did have increased swelling when she was taking meloxicam. She has been taking TheraFlu recently for a head cold. She does complain of occasional cramping sensation in her chest. This is not associated with activity. She really has no general medical followup.  No current outpatient prescriptions on file prior to visit.    Allergies  Allergen Reactions  . Oxycontin   . Penicillins     Past Medical History  Diagnosis Date  . Edema   . Palpitations   . History of diastolic dysfunction   . Arthritis   . OA (osteoarthritis)   . PAC (premature atrial contraction)   . PVC's (premature ventricular contractions)   . Morbid obesity     Past Surgical History  Procedure Date  . Cesarean section   . US echocardiography 07/25/2008    EF 55-60%    History  Smoking status  . Former Smoker  . Quit date: 11/23/1985  Smokeless tobacco  . Not on file    History  Alcohol Use No    Family History  Problem Relation Age of Onset  . Alzheimer's disease Mother   . Lung cancer Mother   . COPD Father     Review of Systems: As noted in history of present illness. She denies any dyspnea or orthopnea. She walks with a cane. All other systems were reviewed and are negative.  Physical Exam: BP 160/94  Pulse 90  Ht 5\' 11"  (1.803 m)  Wt 354 lb 1.9 oz (160.628 kg)  BMI 49.39 kg/m2 She is a morbidly obese black female in no acute distress. Her HEENT exam is unremarkable. Sclera are clear. Oropharynx is clear. Neck is without JVD or bruits. Lungs are clear. Cardiac exam reveals a regular rate  and rhythm without gallop, murmur, or click. Abdomen is morbidly obese, soft, nontender. She has 2-3+ lower extremity edema right greater than left. She has severe skin changes with auto eczemation. LABORATORY DATA:   Assessment / Plan:

## 2011-01-21 ENCOUNTER — Telehealth: Payer: Self-pay | Admitting: Cardiology

## 2011-01-21 NOTE — Telephone Encounter (Signed)
Pt calling wanting to know the results of pt lab work. Please call back.  

## 2011-01-22 ENCOUNTER — Telehealth: Payer: Self-pay | Admitting: *Deleted

## 2011-01-22 NOTE — Telephone Encounter (Signed)
Lm w/lab results. 

## 2011-01-22 NOTE — Telephone Encounter (Signed)
Message copied by Lorayne Bender on Thu Jan 22, 2011 12:02 PM ------      Message from: [place], PETER M      Created: Tue Jan 20, 2011  3:48 PM       Chemistries,cbc are normal. Lipids look pretty good.

## 2011-02-10 ENCOUNTER — Encounter: Payer: Self-pay | Admitting: Nurse Practitioner

## 2011-02-10 ENCOUNTER — Telehealth: Payer: Self-pay | Admitting: *Deleted

## 2011-02-10 ENCOUNTER — Ambulatory Visit (INDEPENDENT_AMBULATORY_CARE_PROVIDER_SITE_OTHER): Payer: Self-pay | Admitting: Nurse Practitioner

## 2011-02-10 VITALS — BP 160/90 | HR 96 | Ht 71.0 in | Wt 344.1 lb

## 2011-02-10 DIAGNOSIS — I872 Venous insufficiency (chronic) (peripheral): Secondary | ICD-10-CM

## 2011-02-10 DIAGNOSIS — I878 Other specified disorders of veins: Secondary | ICD-10-CM

## 2011-02-10 DIAGNOSIS — I10 Essential (primary) hypertension: Secondary | ICD-10-CM

## 2011-02-10 LAB — BASIC METABOLIC PANEL
BUN: 12 mg/dL (ref 6–23)
CO2: 27 mEq/L (ref 19–32)
Calcium: 9.4 mg/dL (ref 8.4–10.5)
Chloride: 106 mEq/L (ref 96–112)
Creatinine, Ser: 0.9 mg/dL (ref 0.4–1.2)
GFR: 79.96 mL/min (ref >60.00)
Glucose, Bld: 133 mg/dL — ABNORMAL HIGH (ref 70–99)
Potassium: 3.6 mEq/L (ref 3.5–5.1)
Sodium: 142 mEq/L (ref 135–145)

## 2011-02-10 MED ORDER — LISINOPRIL 5 MG PO TABS: 5.0000 mg | ORAL_TABLET | Freq: Every day | ORAL | Status: DC

## 2011-02-10 NOTE — Telephone Encounter (Signed)
Notified of lab results. 

## 2011-02-10 NOTE — Assessment & Plan Note (Signed)
Has chronic edema. I have left her on her Lasix every day. We will recheck BMET today and on her return visit. She is encouraged to wrap her legs with ACE wraps if possible.

## 2011-02-10 NOTE — Patient Instructions (Addendum)
Stay on your current medicines. We are going to add some medicine for your blood pressure. It is Lisinopril 5 mg daily. Take the first dose tonight because it can make you dizzy. Tomorrow you can take it in the morning.  We will check your potassium and kidney function today.  We will see you back in 1 month.   Avoid the salt  Try to use some ACE wraps for your legs.

## 2011-02-10 NOTE — Assessment & Plan Note (Signed)
Blood pressure remains up. I have started her on low dose Lisinopril at 5mg  daily. We will see her back in a month. She is encouraged to avoid salt.

## 2011-02-10 NOTE — Assessment & Plan Note (Signed)
This is the crux of her problems. Weight is down about 10 pounds with diuresis.

## 2011-02-10 NOTE — Progress Notes (Signed)
    Loan Guest Date of Birth: 12/09/1945 Medical Record #[id number]  History of Present Illness: Ms. Lupien is seen back today for her 3 week check. She is seen for Dr. [place]. He restarted her Lasix for worsening edema. Her blood pressure was up at her last check. She remains morbidly obese. Does not get regular medical follow up.   She says she is doing about the same. Her weight is down about 10 pounds with taking the Lasix every day. No chest pain. She does have DOE felt to be due to her weight. She has not been able to get the ACE wraps that Dr. [place] recommended. Blood pressure remains elevated.    Current Outpatient Prescriptions on File Prior to Visit  Medication Sig Dispense Refill  . furosemide (LASIX) 40 MG tablet Take 1 tablet (40 mg total) by mouth as needed.  30 tablet  11    Allergies  Allergen Reactions  . Oxycontin   . Penicillins     Past Medical History  Diagnosis Date  . Edema   . Palpitations   . History of diastolic dysfunction     Echo 05/9560 with diastolic dysfunction  . Arthritis   . OA (osteoarthritis)   . PAC (premature atrial contraction)   . PVC's (premature ventricular contractions)   . Morbid obesity     Past Surgical History  Procedure Date  . Cesarean section   . US echocardiography 07/25/2008    EF 55-60%    History  Smoking status  . Former Smoker  . Quit date: 11/23/1985  Smokeless tobacco  . Not on file    History  Alcohol Use No    Family History  Problem Relation Age of Onset  . Alzheimer's disease Mother   . Lung cancer Mother   . COPD Father     Review of Systems: The review of systems is positive for chronic edema. No real palpitations. No chest pain. Does have DOE.  All other systems were reviewed and are negative.  Physical Exam: BP 140/82  Pulse 96  Ht 5\' 11"  (1.803 m)  Wt 344 lb 1.9 oz (156.092 kg)  BMI 48.00 kg/m2 Repeat blood pressure by me is 160/90. Patient is morbidly obese and in no acute  distress. Skin is warm and dry. Color is normal.  HEENT is unremarkable. Normocephalic/atraumatic. PERRL. Sclera are nonicteric. Neck is supple. No masses. No JVD. Lungs are clear. Cardiac exam shows a regular rate and rhythm. No ectopics noted. No S3. Abdomen isobese but soft. Extremities are with 2 to 3+ edema. Her lower legs are very dry and scaly. Gait and ROM are intact. She is using a cane and has difficulty walking.  No gross neurologic deficits noted.  LABORATORY DATA: BMET is pending   Assessment / Plan:

## 2011-03-16 ENCOUNTER — Ambulatory Visit: Payer: Self-pay | Admitting: Nurse Practitioner

## 2011-03-20 ENCOUNTER — Ambulatory Visit (INDEPENDENT_AMBULATORY_CARE_PROVIDER_SITE_OTHER): Payer: Self-pay | Admitting: Nurse Practitioner

## 2011-03-20 ENCOUNTER — Encounter: Payer: Self-pay | Admitting: Nurse Practitioner

## 2011-03-20 VITALS — BP 150/90 | HR 80 | Ht 71.0 in | Wt 350.0 lb

## 2011-03-20 DIAGNOSIS — R609 Edema, unspecified: Secondary | ICD-10-CM

## 2011-03-20 DIAGNOSIS — I10 Essential (primary) hypertension: Secondary | ICD-10-CM

## 2011-03-20 LAB — BASIC METABOLIC PANEL
BUN: 12 mg/dL (ref 6–23)
CO2: 26 mEq/L (ref 19–32)
Calcium: 9 mg/dL (ref 8.4–10.5)
Chloride: 110 mEq/L (ref 96–112)
Creatinine, Ser: 0.8 mg/dL (ref 0.4–1.2)
GFR: 94.1 mL/min (ref >60.00)
Glucose, Bld: 104 mg/dL — ABNORMAL HIGH (ref 70–99)
Potassium: 3.8 mEq/L (ref 3.5–5.1)
Sodium: 143 mEq/L (ref 135–145)

## 2011-03-20 MED ORDER — LISINOPRIL 10 MG PO TABS: 10.0000 mg | ORAL_TABLET | Freq: Every day | ORAL | Status: DC

## 2011-03-20 NOTE — Assessment & Plan Note (Signed)
Chronic problems with venous stasis changes. I have encouraged her to use the Lasix every day and continue with the wraps.

## 2011-03-20 NOTE — Assessment & Plan Note (Signed)
I have increased the Lisinopril to 10 mg daily. We will check a BMET today. We will see her back in a month.

## 2011-03-20 NOTE — Patient Instructions (Addendum)
Take the Lasix every day.  We are going to check your potassium level today.  We are increasing the Lisinopril to 10 mg every day. You may take 2 of your 5 mg tablets to equal this dose and use them up.  We will see you back in about 1 month.  Minimize your salt.  Call the [facility] office at [phone] if you have any questions, problems or concerns.

## 2011-03-20 NOTE — Progress Notes (Signed)
   [name] [name] Date of Birth: [birth year] Medical Record #[id number]  History of Present Illness: [name] [name] is seen today for a follow up visit. She is seen for Dr. [place]. We have placed her on Lisinopril for her blood pressure. She continues to have issues with swelling. Not taking her Lasix every day. Was worried about potassium levels. She has tried to wrap her legs. Unfortunately, weight continues to climb. No chest pain. Does have an occasional flutter in her chest.   Current Outpatient Prescriptions on File Prior to Visit  Medication Sig Dispense Refill  . furosemide (LASIX) 40 MG tablet Take 1 tablet (40 mg total) by mouth as needed.  30 tablet  11  . lisinopril (PRINIVIL,ZESTRIL) 5 MG tablet Take 1 tablet (5 mg total) by mouth daily.  30 tablet  11    Allergies  Allergen Reactions  . Oxycontin   . Penicillins     Past Medical History  Diagnosis Date  . Edema   . Palpitations   . History of diastolic dysfunction     Echo 11/6576 with diastolic dysfunction  . Arthritis   . OA (osteoarthritis)   . PAC (premature atrial contraction)     per prior Holter  . PVC's (premature ventricular contractions)     per prior Holter  . Morbid obesity   . HTN (hypertension)     Past Surgical History  Procedure Date  . Cesarean section   . US echocardiography 07/25/2008    EF 55-60%    History  Smoking status  . Former Smoker  . Quit date: 11/23/1985  Smokeless tobacco  . Not on file    History  Alcohol Use No    Family History  Problem Relation Age of Onset  . Alzheimer's disease Mother   . Lung cancer Mother   . COPD Father     Review of Systems: The review of systems is positive for osteoarthrititis.  All other systems were reviewed and are negative.  Physical Exam: BP 192/110  Pulse 80  Ht 5\' 11"  (1.803 m)  Wt 350 lb (158.759 kg)  BMI 48.82 kg/m2 Repeat blood pressure by me is down to 150/90. Blood pressure did go up significantly with standing.    Patient is morbidly obese but in no acute distress. She uses a cane. Skin is warm and dry. Color is normal.  HEENT is unremarkable. Normocephalic/atraumatic. PERRL. Sclera are nonicteric. Neck is supple. No masses. No JVD. Lungs are clear. Cardiac exam shows a regular rate and rhythm. Abdomen is soft. Extremities are very full with at least 2-3+ edema. She has stasis changes with significant thickness and scaliness.  Gait and ROM are intact. No gross neurologic deficits noted.  LABORATORY DATA: BMET is pending.   Assessment / Plan:

## 2011-03-23 ENCOUNTER — Telehealth: Payer: Self-pay | Admitting: Cardiology

## 2011-03-23 NOTE — Telephone Encounter (Signed)
Notified of lab results. 

## 2011-03-23 NOTE — Telephone Encounter (Signed)
Pt rtn call to [facility] re blood results, [phone]

## 2011-04-20 ENCOUNTER — Ambulatory Visit (INDEPENDENT_AMBULATORY_CARE_PROVIDER_SITE_OTHER): Payer: Self-pay | Admitting: Nurse Practitioner

## 2011-04-20 ENCOUNTER — Encounter: Payer: Self-pay | Admitting: Nurse Practitioner

## 2011-04-20 VITALS — BP 138/80 | HR 90 | Ht 71.0 in | Wt 338.0 lb

## 2011-04-20 DIAGNOSIS — R609 Edema, unspecified: Secondary | ICD-10-CM

## 2011-04-20 DIAGNOSIS — K219 Gastro-esophageal reflux disease without esophagitis: Secondary | ICD-10-CM | POA: Insufficient documentation

## 2011-04-20 DIAGNOSIS — I10 Essential (primary) hypertension: Secondary | ICD-10-CM

## 2011-04-20 LAB — BASIC METABOLIC PANEL
BUN: 13 mg/dL (ref 6–23)
CO2: 25 mEq/L (ref 19–32)
Calcium: 8.8 mg/dL (ref 8.4–10.5)
Chloride: 104 mEq/L (ref 96–112)
Creatinine, Ser: 1 mg/dL (ref 0.4–1.2)
GFR: 76.04 mL/min (ref >60.00)
Glucose, Bld: 134 mg/dL — ABNORMAL HIGH (ref 70–99)
Potassium: 3.4 mEq/L — ABNORMAL LOW (ref 3.5–5.1)
Sodium: 141 mEq/L (ref 135–145)

## 2011-04-20 MED ORDER — OMEPRAZOLE 20 MG PO CPDR: 20.0000 mg | DELAYED_RELEASE_CAPSULE | Freq: Every day | ORAL | Status: DC

## 2011-04-20 NOTE — Patient Instructions (Signed)
Your blood pressure looks much better.  Stay on your current medicines.  We will recheck your potassium levels today.  I have sent a prescription for omeprazole to the drug store for your stomach. May take each day as needed.  We will see you in about 4 months.   Call the [facility] office at [phone] if you have any questions, problems or concerns.

## 2011-04-20 NOTE — Progress Notes (Signed)
[name] [name] Date of Birth: 1945/10/05 Medical Record #[id number]  History of Present Illness: [name] is seen today for a one month visit. She is seen for Dr. [place]. She is morbidly obese and has chronic lower extremity edema. Has diastolic dysfunction per last echo in 2010. Blood pressure has been trending up and we increased her Lisinopril at her last visit.   She comes in today. She is doing ok. Has lost some weight. Taking her Lasix now every day. Blood pressure is better. Still with have some occasional "flutters" but no syncope. Does continue to use some caffeine. Has chronic edema. No chest pain. Is complaining of some abdominal pain. Used to take PPI in the past.   Current Outpatient Prescriptions on File Prior to Visit  Medication Sig Dispense Refill  . furosemide (LASIX) 40 MG tablet Take 1 tablet (40 mg total) by mouth as needed.  30 tablet  11  . lisinopril (PRINIVIL,ZESTRIL) 10 MG tablet Take 1 tablet (10 mg total) by mouth daily.  30 tablet  11    Allergies  Allergen Reactions  . Oxycontin   . Penicillins     Past Medical History  Diagnosis Date  . Edema   . Palpitations   . History of diastolic dysfunction     Echo 07/979 with diastolic dysfunction  . Arthritis   . OA (osteoarthritis)   . PAC (premature atrial contraction)     per prior Holter  . PVC's (premature ventricular contractions)     per prior Holter  . Morbid obesity   . HTN (hypertension)     Past Surgical History  Procedure Date  . Cesarean section   . US echocardiography 07/25/2008    EF 55-60%    History  Smoking status  . Former Smoker  . Quit date: 11/23/1985  Smokeless tobacco  . Not on file    History  Alcohol Use No    Family History  Problem Relation Age of Onset  . Alzheimer's disease Mother   . Lung cancer Mother   . COPD Father     Review of Systems: The review of systems is positive for difficulty walking. Uses a cane. Will have occasional palpitation.   Bowels are a little sluggish. No bleeding noted. She will have an occasional cramp. All other systems were reviewed and are negative.  Physical Exam: BP 138/80  Pulse 90  Ht 5\' 11"  (1.803 m)  Wt 338 lb (153.316 kg)  BMI 47.14 kg/m2 Patient is very pleasant and in no acute distress. She is morbidly obese. Skin is warm and dry. Color is normal.  HEENT is unremarkable. Normocephalic/atraumatic. PERRL. Sclera are nonicteric. Neck is supple. No masses. No JVD. Lungs are clear. Cardiac exam shows a regular rate and rhythm. Abdomen is soft. Extremities are with chronic 2 to 3+ edema. Her lower extremities are scaly and have stasis changes. Gait and ROM are intact, but unsteady. She uses a cane. No gross neurologic deficits noted.   Lab Results  Component Value Date   WBC 5.4 01/20/2011   HGB 12.5 01/20/2011   HCT 37.5 01/20/2011   PLT 210.0 01/20/2011   GLUCOSE 104* 03/20/2011   CHOL 167 01/20/2011   TRIG 62.0 01/20/2011   HDL 55.00 01/20/2011   LDLCALC 100* 01/20/2011   ALT 15 01/20/2011   AST 20 01/20/2011   NA 143 03/20/2011   K 3.8 03/20/2011   CL 110 03/20/2011   CREATININE 0.8 03/20/2011   BUN 12 03/20/2011  CO2 26 03/20/2011    Assessment / Plan:

## 2011-04-20 NOTE — Assessment & Plan Note (Signed)
This is chronic. Will keep on the low dose Lasix.

## 2011-04-20 NOTE — Assessment & Plan Note (Signed)
Blood pressure is better. I have left her on her current regimen. We will see her back in 4 months. Recheck her potassium level today.

## 2011-04-20 NOTE — Assessment & Plan Note (Addendum)
She would like to go back on some PPI. I have sent a RX for omeprazole 20 mg daily. She is to follow up with primary care if no improvement in her symptoms.

## 2011-04-21 ENCOUNTER — Telehealth: Payer: Self-pay | Admitting: Cardiology

## 2011-04-21 NOTE — Telephone Encounter (Signed)
Fu call °Patient returning your call about test results °

## 2011-04-22 ENCOUNTER — Other Ambulatory Visit: Payer: Self-pay

## 2011-04-22 DIAGNOSIS — I10 Essential (primary) hypertension: Secondary | ICD-10-CM

## 2011-04-22 NOTE — Telephone Encounter (Signed)
Patient called and was told potassium low at 3.4.Advised to increase potassium in her diet.Repeat bmet in 2 weeks.

## 2011-05-06 ENCOUNTER — Other Ambulatory Visit (INDEPENDENT_AMBULATORY_CARE_PROVIDER_SITE_OTHER): Payer: Self-pay | Admitting: *Deleted

## 2011-05-06 DIAGNOSIS — I10 Essential (primary) hypertension: Secondary | ICD-10-CM

## 2011-05-06 LAB — BASIC METABOLIC PANEL
BUN: 10 mg/dL (ref 6–23)
CO2: 28 mEq/L (ref 19–32)
Calcium: 8.9 mg/dL (ref 8.4–10.5)
Chloride: 108 mEq/L (ref 96–112)
Creatinine, Ser: 0.8 mg/dL (ref 0.4–1.2)
GFR: 94.06 mL/min (ref >60.00)
Glucose, Bld: 114 mg/dL — ABNORMAL HIGH (ref 70–99)
Potassium: 3.9 mEq/L (ref 3.5–5.1)
Sodium: 142 mEq/L (ref 135–145)

## 2011-07-22 ENCOUNTER — Ambulatory Visit: Payer: Self-pay | Admitting: Nurse Practitioner

## 2011-07-28 ENCOUNTER — Encounter: Payer: Self-pay | Admitting: Nurse Practitioner

## 2011-07-28 ENCOUNTER — Ambulatory Visit (INDEPENDENT_AMBULATORY_CARE_PROVIDER_SITE_OTHER): Payer: Self-pay | Admitting: Nurse Practitioner

## 2011-07-28 VITALS — BP 140/90 | HR 120 | Ht 71.0 in | Wt 352.0 lb

## 2011-07-28 DIAGNOSIS — I471 Supraventricular tachycardia: Secondary | ICD-10-CM | POA: Insufficient documentation

## 2011-07-28 DIAGNOSIS — I10 Essential (primary) hypertension: Secondary | ICD-10-CM

## 2011-07-28 DIAGNOSIS — R609 Edema, unspecified: Secondary | ICD-10-CM

## 2011-07-28 DIAGNOSIS — R000 Tachycardia, unspecified: Secondary | ICD-10-CM

## 2011-07-28 LAB — CBC WITH DIFFERENTIAL/PLATELET
Basophils Absolute: 0.1 10*3/uL (ref 0.0–0.1)
Basophils Relative: 0.9 % (ref 0.0–3.0)
Eosinophils Absolute: 0.2 10*3/uL (ref 0.0–0.7)
Eosinophils Relative: 3.3 % (ref 0.0–5.0)
HCT: 35.5 % — ABNORMAL LOW (ref 36.0–46.0)
Hemoglobin: 11.6 g/dL — ABNORMAL LOW (ref 12.0–15.0)
Lymphocytes Relative: 28.5 % (ref 12.0–46.0)
Lymphs Abs: 1.7 10*3/uL (ref 0.7–4.0)
MCHC: 32.5 g/dL (ref 30.0–36.0)
MCV: 80.8 fl (ref 78.0–100.0)
Monocytes Absolute: 0.4 10*3/uL (ref 0.1–1.0)
Monocytes Relative: 7.4 % (ref 3.0–12.0)
Neutro Abs: 3.6 10*3/uL (ref 1.4–7.7)
Neutrophils Relative %: 59.9 % (ref 43.0–77.0)
Platelets: 272 10*3/uL (ref 150.0–400.0)
RBC: 4.4 Mil/uL (ref 3.87–5.11)
RDW: 17.2 % — ABNORMAL HIGH (ref 11.5–14.6)
WBC: 6.1 10*3/uL (ref 4.5–10.5)

## 2011-07-28 LAB — BASIC METABOLIC PANEL
BUN: 10 mg/dL (ref 6–23)
CO2: 26 mEq/L (ref 19–32)
Calcium: 9.4 mg/dL (ref 8.4–10.5)
Chloride: 108 mEq/L (ref 96–112)
Creatinine, Ser: 0.8 mg/dL (ref 0.4–1.2)
GFR: 90.04 mL/min (ref >60.00)
Glucose, Bld: 124 mg/dL — ABNORMAL HIGH (ref 70–99)
Potassium: 3.8 mEq/L (ref 3.5–5.1)
Sodium: 141 mEq/L (ref 135–145)

## 2011-07-28 LAB — MAGNESIUM: Magnesium: 1.9 mg/dL (ref 1.5–2.5)

## 2011-07-28 LAB — TSH: TSH: 3.93 u[IU]/mL (ref 0.35–5.50)

## 2011-07-28 MED ORDER — METOPROLOL TARTRATE 50 MG PO TABS: 50.0000 mg | ORAL_TABLET | Freq: Two times a day (BID) | ORAL | Status: DC

## 2011-07-28 NOTE — Assessment & Plan Note (Signed)
Blood pressure remains elevated. Metoprolol has been added.

## 2011-07-28 NOTE — Assessment & Plan Note (Signed)
This is chronic. She is only using her Lasix prn.

## 2011-07-28 NOTE — Patient Instructions (Addendum)
Your heart rate is too fast today. You may be out of rhythm. We need to slow your heart rate down. We are going to start Metoprolol 50 mg two times a day. Start on it today.  We are going to put a heart monitor on.  Continue with your other current medicines.  Limit sodium intake.  I will see you back on Friday.   Call the [facility] office at [phone] if you have any questions, problems or concerns.

## 2011-07-28 NOTE — Assessment & Plan Note (Signed)
She presents today with tachycardia. Her EKG is difficult to interpret. Regardless, her rate is fast. I have reviewed her case with both Dr. [place] and Dr. Ladona Ridgel. We are going to check labs today, start Metoprolol 50 mg BID and place an event monitor. Will see her back here on Friday with EKG. When we can get her rate down, will update her echo. She may require anticoagulation but will revisit that issue on Friday. Patient is agreeable to this plan and will call if any problems develop in the interim.

## 2011-07-28 NOTE — Progress Notes (Signed)
[name] [name] Date of Birth: [birth year] Medical Record #[id number]  History of Present Illness: [name] is seen back today for her 4 month check. She is seen for Dr. Swaziland. She remains morbidly obese and has chronic lower extremity edema. Her last echo in 2010 showed diastolic dysfunction. Lisinopril was increased a couple of months ago.  She comes in today. She is here alone. Unfortunately, she has gained more weight. She is up 14 pounds. Remains short of breath with limited activities. She is only taking her Lasix 3 to 4 times a week because she has incontinence and does not like to take the Lasix when going outside her home. She does have some palpitations and notes her heart races. Still with DOE. Still using salt. Says her swelling is unchanged. She did try wrapping her legs in ACE wraps but that makes her legs itch. She does not check her blood pressure cuff at home. Does not have a cuff.   Current Outpatient Prescriptions on File Prior to Visit  Medication Sig Dispense Refill  . furosemide (LASIX) 40 MG tablet Take 1 tablet (40 mg total) by mouth as needed.  30 tablet  11  . lisinopril (PRINIVIL,ZESTRIL) 10 MG tablet Take 1 tablet (10 mg total) by mouth daily.  30 tablet  11  . DISCONTD: omeprazole (PRILOSEC) 20 MG capsule Take 1 capsule (20 mg total) by mouth daily.  30 capsule  6    Allergies  Allergen Reactions  . Oxycontin   . Penicillins     Past Medical History  Diagnosis Date  . Edema   . Palpitations   . History of diastolic dysfunction     Echo 07/979 with diastolic dysfunction  . Arthritis   . OA (osteoarthritis)   . PAC (premature atrial contraction)     per prior Holter  . PVC's (premature ventricular contractions)     per prior Holter  . Morbid obesity   . HTN (hypertension)     Past Surgical History  Procedure Date  . Cesarean section   . US echocardiography 07/25/2008    EF 55-60%    History  Smoking status  . Former Smoker  . Quit date:  11/23/1985  Smokeless tobacco  . Not on file    History  Alcohol Use No    Family History  Problem Relation Age of Onset  . Alzheimer's disease Mother   . Lung cancer Mother   . COPD Father     Review of Systems: The review of systems is positive for joint pains and difficulty walking.  All other systems were reviewed and are negative.  Physical Exam: BP 174/100  Pulse 130  Ht 5\' 11"  (1.803 m)  Wt 352 lb (159.666 kg)  BMI 49.09 kg/m2 Repeat blood pressure by me was 140/90 in the left arm with the large cuff. Heart rate is down to the 120's. Patient is very pleasant and in no acute distress. She did become short of breath and tachycardic with walking in the office today. She is morbidly obese. Skin is warm and dry. Color is normal.  HEENT is unremarkable. Normocephalic/atraumatic. PERRL. Sclera are nonicteric. Neck is supple. No masses. No JVD. Lungs are clear. Cardiac exam shows a regular rate and rhythm. She is tachycardic. Abdomen is obese but soft. Extremities are chronically full with edema. Gait and ROM are intact but she is quite kyphotic and has to use a cane.  No gross neurologic deficits noted.    Lab Results  Component  Value Date   WBC 5.4 01/20/2011   HGB 12.5 01/20/2011   HCT 37.5 01/20/2011   PLT 210.0 01/20/2011   GLUCOSE 114* 05/06/2011   CHOL 167 01/20/2011   TRIG 62.0 01/20/2011   HDL 55.00 01/20/2011   LDLCALC 100* 01/20/2011   ALT 15 01/20/2011   AST 20 01/20/2011   NA 142 05/06/2011   K 3.9 05/06/2011   CL 108 05/06/2011   CREATININE 0.8 05/06/2011   BUN 10 05/06/2011   CO2 28 05/06/2011   EKG today is reviewed with both Dr. Swaziland and Dr. Ladona Ridgel. The rhythm is difficult to interpret. She may be in 2:1 atrial flutter, some type of atrial tachycardia, long PR tach or AVNRT. Regardless, her rate is elevated.   Assessment / Plan:

## 2011-07-31 ENCOUNTER — Telehealth: Payer: Self-pay | Admitting: *Deleted

## 2011-07-31 ENCOUNTER — Encounter: Payer: Self-pay | Admitting: Nurse Practitioner

## 2011-07-31 ENCOUNTER — Ambulatory Visit (INDEPENDENT_AMBULATORY_CARE_PROVIDER_SITE_OTHER): Payer: Self-pay | Admitting: Nurse Practitioner

## 2011-07-31 VITALS — BP 140/80 | HR 73 | Ht 71.0 in | Wt 353.0 lb

## 2011-07-31 DIAGNOSIS — R000 Tachycardia, unspecified: Secondary | ICD-10-CM

## 2011-07-31 DIAGNOSIS — R609 Edema, unspecified: Secondary | ICD-10-CM

## 2011-07-31 DIAGNOSIS — I10 Essential (primary) hypertension: Secondary | ICD-10-CM

## 2011-07-31 NOTE — Assessment & Plan Note (Signed)
This is unchanged 

## 2011-07-31 NOTE — Progress Notes (Signed)
[name] [name] Date of Birth: May 13, 1945 Medical Record #[id number]  History of Present Illness: [name] [name] is seen back today for a 3 day check. She is seen for Dr. Sw[place]iland. She is morbidly obese and has chronic lower extremity edema & HTN with last echo in 2010 showing diastolic dysfunction. She was here earlier in the week. Noted to have some type of tachycardia on her EKG. Difficult to say exactly what rhythm it was. We started her on Metoprolol and placed an event monitor.  She comes in today. She is doing ok. Feels better. Blood pressure is better. She is taking her medicine. No complaint of palpitations but she had no awareness when she was here earlier this week with the elevated heart rate. She has her event monitor on but it is difficult for the leads to stay on.   Current Outpatient Prescriptions on File Prior to Visit  Medication Sig Dispense Refill  . furosemide (LASIX) 40 MG tablet Take 1 tablet (40 mg total) by mouth as needed.  30 tablet  11  . lisinopril (PRINIVIL,ZESTRIL) 10 MG tablet Take 1 tablet (10 mg total) by mouth daily.  30 tablet  11  . metoprolol (LOPRESSOR) 50 MG tablet Take 1 tablet (50 mg total) by mouth 2 (two) times daily.  60 tablet  11  . omeprazole (PRILOSEC) 20 MG capsule Take 20 mg by mouth as needed.        Allergies  Allergen Reactions  . Oxycontin   . Penicillins     Past Medical History  Diagnosis Date  . Edema   . Palpitations   . History of diastolic dysfunction     Echo 07/979 with diastolic dysfunction  . Arthritis   . OA (osteoarthritis)   . PAC (premature atrial contraction)     per prior Holter  . PVC's (premature ventricular contractions)     per prior Holter  . Morbid obesity   . HTN (hypertension)   . Tachycardia     noted at 07/28/11 visit. Started on beta blocker. Possible atrial flutter vs long PR tachycardia/AVNRT    Past Surgical History  Procedure Date  . Cesarean section   . US echocardiography 07/25/2008    EF  55-60%    History  Smoking status  . Former Smoker  . Quit date: 11/23/1985  Smokeless tobacco  . Not on file    History  Alcohol Use No    Family History  Problem Relation Age of Onset  . Alzheimer's disease Mother   . Lung cancer Mother   . COPD Father     Review of Systems: The review of systems is per the HPI  All other systems were reviewed and are negative.  Physical Exam: BP 140/80  Pulse 73  Ht 5\' 11"  (1.803 m)  Wt 353 lb (160.12 kg)  BMI 49.23 kg/m2 Patient is very pleasant and in no acute distress. She is obese. Skin is warm and dry. Color is normal.  HEENT is unremarkable. Normocephalic/atraumatic. PERRL. Sclera are nonicteric. Neck is supple. No masses. No JVD. Lungs are clear. Cardiac exam shows a regular rate and rhythm today. Abdomen is obese but soft. Extremities are with full with 2+ edema which is chronic. Gait and ROM are intact but she has to use a cane and is quite kyphotic. No gross neurologic deficits noted.   LABORATORY DATA: EKG today shows sinus rhythm with a rate of 73. The machine reads out a prolonged QT but it is 438 ms. Marland Kitchen  Lab Results  Component Value Date   WBC 6.1 07/28/2011   HGB 11.6* 07/28/2011   HCT 35.5* 07/28/2011   PLT 272.0 07/28/2011   GLUCOSE 124* 07/28/2011   CHOL 167 01/20/2011   TRIG 62.0 01/20/2011   HDL 55.00 01/20/2011   LDLCALC 100* 01/20/2011   ALT 15 01/20/2011   AST 20 01/20/2011   NA 141 07/28/2011   K 3.8 07/28/2011   CL 108 07/28/2011   CREATININE 0.8 07/28/2011   BUN 10 07/28/2011   CO2 26 07/28/2011   TSH 3.93 07/28/2011    Assessment / Plan:

## 2011-07-31 NOTE — Telephone Encounter (Signed)
Message copied by Burnell Blanks on Fri Jul 31, 2011  3:01 PM ------      Message from: Rosalio Macadamia      Created: Fri Jul 31, 2011 12:56 PM       Juliette Alcide,      Will you call her today and make sure she is taking at least a baby aspirin daily.            Thanks            Lawson Fiscal

## 2011-07-31 NOTE — Assessment & Plan Note (Signed)
The tachycardia has resolved with the metoprolol. She is in sinus rhythm today. She has her event monitor in place. It was our intention to update her echo. She has no insurance and will not be able to use her Medicare until July 1st. We will try to hold off until then. We will see her back in about 6 weeks. We will keep her on her current regimen. Will need to make sure she is on aspirin therapy. Patient is agreeable to this plan and will call if any problems develop in the interim.

## 2011-07-31 NOTE — Patient Instructions (Signed)
Keep wearing the monitor.   We will see you back in about 6 weeks  Will plan for an ultrasound of your heart when you get your Medicare.  Stay on your medicines.  Call the [facility] office at [phone] if you have any questions, problems or concerns.

## 2011-07-31 NOTE — Telephone Encounter (Signed)
Left message, call back and let us know received

## 2011-07-31 NOTE — Assessment & Plan Note (Signed)
Blood pressure has improved with the extra medicine.

## 2011-08-03 NOTE — Telephone Encounter (Signed)
Fu call °Pt returning your call  °

## 2011-08-03 NOTE — Telephone Encounter (Signed)
Left message

## 2011-08-04 NOTE — Telephone Encounter (Signed)
noted 

## 2011-08-04 NOTE — Telephone Encounter (Signed)
Fu msg Pt said she received your message

## 2011-09-08 ENCOUNTER — Ambulatory Visit: Payer: Self-pay | Admitting: Cardiology

## 2011-09-30 ENCOUNTER — Encounter: Payer: Self-pay | Admitting: Cardiology

## 2011-09-30 ENCOUNTER — Ambulatory Visit (INDEPENDENT_AMBULATORY_CARE_PROVIDER_SITE_OTHER): Payer: Self-pay | Admitting: Cardiology

## 2011-09-30 VITALS — BP 155/75 | HR 66 | Ht 71.0 in | Wt 354.0 lb

## 2011-09-30 DIAGNOSIS — I878 Other specified disorders of veins: Secondary | ICD-10-CM

## 2011-09-30 DIAGNOSIS — I10 Essential (primary) hypertension: Secondary | ICD-10-CM

## 2011-09-30 DIAGNOSIS — I872 Venous insufficiency (chronic) (peripheral): Secondary | ICD-10-CM

## 2011-09-30 DIAGNOSIS — I471 Supraventricular tachycardia: Secondary | ICD-10-CM

## 2011-09-30 DIAGNOSIS — I498 Other specified cardiac arrhythmias: Secondary | ICD-10-CM

## 2011-09-30 NOTE — Assessment & Plan Note (Signed)
We discussed appropriate treatment with diuretics, sodium restriction, and elevation of her feet. Clearly her legs are too large for support hose. If need be she can use Ace wraps.

## 2011-09-30 NOTE — Progress Notes (Signed)
[name] [name] Date of Birth: 1945/05/23 Medical Record #[id number]  History of Present Illness: [name] [name] is seen back today for a followup visit. She was seen on April 23 with symptoms of tachycardia. ECG at that time demonstrated a narrow complex tachycardia with a rate of 123 beats per minute. It is unclear what the mechanism was. She was placed on metoprolol and seen 3 days later was back in sinus rhythm. She has felt much better on metoprolol. She still has some breakthrough tachycardia but is much less frequent and severe. She previously had had symptoms of dizziness and this has resolved. She wore an event monitor. There were 9 recordings. 8 of these demonstrated sinus rhythm to sinus bradycardia. She had one episode of tachycardia which is a narrow complex tachycardia with a rate of 174 beats per minute.  Current Outpatient Prescriptions on File Prior to Visit  Medication Sig Dispense Refill  . furosemide (LASIX) 40 MG tablet Take 1 tablet (40 mg total) by mouth as needed.  30 tablet  11  . lisinopril (PRINIVIL,ZESTRIL) 10 MG tablet Take 1 tablet (10 mg total) by mouth daily.  30 tablet  11  . metoprolol (LOPRESSOR) 50 MG tablet Take 1 tablet (50 mg total) by mouth 2 (two) times daily.  60 tablet  11  . omeprazole (PRILOSEC) 20 MG capsule Take 20 mg by mouth as needed.        Allergies  Allergen Reactions  . Oxycodone Hcl Er   . Penicillins     Past Medical History  Diagnosis Date  . Edema   . Palpitations   . History of diastolic dysfunction     Echo 06/863 with diastolic dysfunction  . Arthritis   . OA (osteoarthritis)   . PAC (premature atrial contraction)     per prior Holter  . PVC's (premature ventricular contractions)     per prior Holter  . Morbid obesity   . HTN (hypertension)   . Tachycardia     noted at 07/28/11 visit. Started on beta blocker. Possible atrial flutter vs long PR tachycardia/AVNRT    Past Surgical History  Procedure Date  . Cesarean  section   . US echocardiography 07/25/2008    EF 55-60%    History  Smoking status  . Former Smoker  . Quit date: 11/23/1985  Smokeless tobacco  . Not on file    History  Alcohol Use No    Family History  Problem Relation Age of Onset  . Alzheimer's disease Mother   . Lung cancer Mother   . COPD Father     Review of Systems: The review of systems is positive for chronic venous stasis disease. She has tried wrapping her legs with some improvement. She tries to keep her legs elevated when possible. She cannot find support hose large enough. All other systems were reviewed and are negative.  Physical Exam: BP 155/75  Pulse 66  Ht 5\' 11"  (1.803 m)  Wt 354 lb (160.573 kg)  BMI 49.37 kg/m2 Patient is very pleasant and in no acute distress. She is obese. Skin is warm and dry. Color is normal.  HEENT is unremarkable. Normocephalic/atraumatic. PERRL. Sclera are nonicteric. Neck is supple. No masses. No JVD. Lungs are clear. Cardiac exam shows a regular rate and rhythm today. Abdomen is obese but soft. Extremities demonstrate chronic severe venostasis disease with severe brawny edema and hyperkeratosis of both lower extremities. There is no evidence of cellulitis. Gait and ROM are intact but she has  to use a cane and is quite kyphotic. No gross neurologic deficits noted.   LABORATORY DATA: Assessment / Plan:

## 2011-09-30 NOTE — Assessment & Plan Note (Signed)
Blood pressures not well controlled today. We will increase her lisinopril to 20 mg daily. Continue metoprolol at current dose.

## 2011-09-30 NOTE — Assessment & Plan Note (Signed)
Her event monitor documented a single episode of supraventricular tachycardia. Clinically she is doing much better on metoprolol. I don't think she would tolerate a higher dose based on her resting bradycardia. We will continue on her current dose. Laboratory data in April was unremarkable. I recommended avoidance of caffeine and decongestants. I'll have her return in 3 months for followup. At that point if her Medicare insurance comes through we will order an echocardiogram. If she should develop more frequent or severe episodes of tachycardia we may need to consider EP study with ablation.

## 2011-09-30 NOTE — Patient Instructions (Signed)
Continue your current medication  Restrict your salt intake.  Keep your feet elevated when possible.  We will see you again in 3 months.

## 2011-12-31 ENCOUNTER — Encounter: Payer: Self-pay | Admitting: Nurse Practitioner

## 2011-12-31 ENCOUNTER — Ambulatory Visit (INDEPENDENT_AMBULATORY_CARE_PROVIDER_SITE_OTHER): Payer: Medicare Other | Admitting: Nurse Practitioner

## 2011-12-31 VITALS — BP 150/88 | HR 63 | Ht 71.0 in | Wt 347.1 lb

## 2011-12-31 DIAGNOSIS — E785 Hyperlipidemia, unspecified: Secondary | ICD-10-CM

## 2011-12-31 DIAGNOSIS — I471 Supraventricular tachycardia, unspecified: Secondary | ICD-10-CM

## 2011-12-31 DIAGNOSIS — I10 Essential (primary) hypertension: Secondary | ICD-10-CM

## 2011-12-31 LAB — LIPID PANEL
Cholesterol: 165 mg/dL (ref 0–200)
HDL: 45.9 mg/dL (ref >39.00)
LDL Cholesterol: 105 mg/dL — ABNORMAL HIGH (ref 0–99)
Total CHOL/HDL Ratio: 4
Triglycerides: 71 mg/dL (ref 0.0–149.0)
VLDL: 14.2 mg/dL (ref 0.0–40.0)

## 2011-12-31 LAB — CBC WITH DIFFERENTIAL/PLATELET
Basophils Absolute: 0 10*3/uL (ref 0.0–0.1)
Basophils Relative: 0.9 % (ref 0.0–3.0)
Eosinophils Absolute: 0.2 10*3/uL (ref 0.0–0.7)
Eosinophils Relative: 3.5 % (ref 0.0–5.0)
HCT: 36.5 % (ref 36.0–46.0)
Hemoglobin: 11.6 g/dL — ABNORMAL LOW (ref 12.0–15.0)
Lymphocytes Relative: 38.9 % (ref 12.0–46.0)
Lymphs Abs: 2.1 10*3/uL (ref 0.7–4.0)
MCHC: 31.9 g/dL (ref 30.0–36.0)
MCV: 80.9 fl (ref 78.0–100.0)
Monocytes Absolute: 0.4 10*3/uL (ref 0.1–1.0)
Monocytes Relative: 8 % (ref 3.0–12.0)
Neutro Abs: 2.6 10*3/uL (ref 1.4–7.7)
Neutrophils Relative %: 48.7 % (ref 43.0–77.0)
Platelets: 230 10*3/uL (ref 150.0–400.0)
RBC: 4.51 Mil/uL (ref 3.87–5.11)
RDW: 17.3 % — ABNORMAL HIGH (ref 11.5–14.6)
WBC: 5.3 10*3/uL (ref 4.5–10.5)

## 2011-12-31 LAB — BASIC METABOLIC PANEL
BUN: 13 mg/dL (ref 6–23)
CO2: 25 mEq/L (ref 19–32)
Calcium: 9.2 mg/dL (ref 8.4–10.5)
Chloride: 106 mEq/L (ref 96–112)
Creatinine, Ser: 0.9 mg/dL (ref 0.4–1.2)
GFR: 80.76 mL/min (ref >60.00)
Glucose, Bld: 108 mg/dL — ABNORMAL HIGH (ref 70–99)
Potassium: 3.6 mEq/L (ref 3.5–5.1)
Sodium: 139 mEq/L (ref 135–145)

## 2011-12-31 LAB — HEPATIC FUNCTION PANEL
ALT: 12 U/L (ref 0–35)
AST: 20 U/L (ref 0–37)
Albumin: 3.6 g/dL (ref 3.5–5.2)
Alkaline Phosphatase: 49 U/L (ref 39–117)
Bilirubin, Direct: 0 mg/dL (ref 0.0–0.3)
Total Bilirubin: 0.4 mg/dL (ref 0.3–1.2)
Total Protein: 8.3 g/dL (ref 6.0–8.3)

## 2011-12-31 MED ORDER — LISINOPRIL 20 MG PO TABS: 20.0000 mg | ORAL_TABLET | Freq: Every day | ORAL | Status: DC

## 2011-12-31 MED ORDER — OMEPRAZOLE 20 MG PO CPDR: 20.0000 mg | DELAYED_RELEASE_CAPSULE | Freq: Every day | ORAL | Status: DC

## 2011-12-31 NOTE — Progress Notes (Signed)
[name] [name] Date of Birth: Nov 03, 1945 Medical Record #[id number]  History of Present Illness: [name] is seen back today for her 3 month check. She has a history of palpitations and has had documented narrow complex tachycardia with a rate in the 120's. Unclear as to what the mechanism was and has done better with beta blocker therapy. Her other problems include edema, morbid obesity, HTN and chronic pain.   She comes in today. She is here alone. She is doing ok. She has some indigestion with a sour taste in her mouth. She has not been taking her PPI and is only taking her aspirin occasionally. Weight is down about 7 pounds. Says she is eating less. No actual chest pain. Rarely dizzy. Will have some "flutters" but no syncope. Wearing her support stockings. She has not had any medicines today yet.   Current Outpatient Prescriptions on File Prior to Visit  Medication Sig Dispense Refill  . furosemide (LASIX) 40 MG tablet Take 1 tablet (40 mg total) by mouth as needed.  30 tablet  11  . metoprolol (LOPRESSOR) 50 MG tablet Take 1 tablet (50 mg total) by mouth 2 (two) times daily.  60 tablet  11  . DISCONTD: omeprazole (PRILOSEC) 20 MG capsule Take 20 mg by mouth as needed.        Allergies  Allergen Reactions  . Oxycodone Hcl Er   . Penicillins     Past Medical History  Diagnosis Date  . Edema   . Palpitations   . History of diastolic dysfunction     Echo 11/2954 with diastolic dysfunction  . Arthritis   . OA (osteoarthritis)   . PAC (premature atrial contraction)     per prior Holter  . PVC's (premature ventricular contractions)     per prior Holter  . Morbid obesity   . HTN (hypertension)   . Tachycardia     noted at 07/28/11 visit. Started on beta blocker. Possible atrial flutter vs long PR tachycardia/AVNRT    Past Surgical History  Procedure Date  . Cesarean section   . US echocardiography 07/25/2008    EF 55-60%    History  Smoking status  . Former Smoker  .  Quit date: 11/23/1985  Smokeless tobacco  . Not on file    History  Alcohol Use No    Family History  Problem Relation Age of Onset  . Alzheimer's disease Mother   . Lung cancer Mother   . COPD Father     Review of Systems: The review of systems is per the HPI.  All other systems were reviewed and are negative.  Physical Exam: BP 150/88  Pulse 63  Ht 5\' 11"  (1.803 m)  Wt 347 lb 1.9 oz (157.453 kg)  BMI 48.41 kg/m2 Patient is very pleasant and in no acute distress. Weight is down 7 pounds. She remains morbidly obese. Skin is warm and dry. Color is normal.  HEENT is unremarkable. Normocephalic/atraumatic. PERRL. Sclera are nonicteric. Neck is supple. No masses. No JVD. Lungs are clear. Cardiac exam shows a regular rate and rhythm. Abdomen is obese but soft. Extremities are quite full with chronic edema. She has her support stockings on today. Gait and ROM are intact. No gross neurologic deficits noted.   LABORATORY DATA: PENDING FOR TODAY  Lab Results  Component Value Date   WBC 6.1 07/28/2011   HGB 11.6* 07/28/2011   HCT 35.5* 07/28/2011   PLT 272.0 07/28/2011   GLUCOSE 124* 07/28/2011   CHOL  167 01/20/2011   TRIG 62.0 01/20/2011   HDL 55.00 01/20/2011   LDLCALC 100* 01/20/2011   ALT 15 01/20/2011   AST 20 01/20/2011   NA 141 07/28/2011   K 3.8 07/28/2011   CL 108 07/28/2011   CREATININE 0.8 07/28/2011   BUN 10 07/28/2011   CO2 26 07/28/2011   TSH 3.93 07/28/2011     Assessment / Plan: 1. HTN - no medicines today. Repeat BP by me was some better at 150/90. I have asked her to take her medicines when she gets home.   2. GERD - not taking her PPI regularly. I have suggested daily use and have refilled it for her today.  3. Narrow complex tachycardia - She now has her Medicare in place. Will go ahead and obtain an echo. She has had 2 documented spells with a rate of 123 and 174. Per Dr. Elvis Coil previous recommendation, if she has more frequent or severe episodes will need  to consider referral to EP for EPS and possible ablation. For now, she seems to be doing ok. Will continue with her beta blocker.   4. Chronic edema - seems unchanged. She is using support stockings.   I will see her back in about 4 months. We will check labs today. Arrange for an echo. No change in her medicines today.  Patient is agreeable to this plan and will call if any problems develop in the interim.

## 2011-12-31 NOTE — Patient Instructions (Addendum)
I have refilled your Prilosec to take every day  Lets get an ultrasound of your heart  We are going to check some labs today  Stay on your current medicines  We will see you back in 4 months.  Call the [facility] office at [phone] if you have any questions, problems or concerns.

## 2012-01-01 ENCOUNTER — Telehealth: Payer: Self-pay | Admitting: *Deleted

## 2012-01-01 NOTE — Telephone Encounter (Signed)
Message copied by Tarri Fuller on Fri Jan 01, 2012 11:16 AM ------      Message from: [name] [name]      Created: Thu Dec 31, 2011  3:36 PM       Ok to report. Labs are satisfactory. Continue same medicines.

## 2012-01-01 NOTE — Telephone Encounter (Signed)
lmom labs ok, no changes to be made 

## 2012-01-11 ENCOUNTER — Other Ambulatory Visit (HOSPITAL_COMMUNITY): Payer: Medicare Other

## 2012-01-13 ENCOUNTER — Ambulatory Visit (HOSPITAL_COMMUNITY): Payer: Medicare Other | Attending: Cardiovascular Disease | Admitting: Radiology

## 2012-01-13 DIAGNOSIS — R609 Edema, unspecified: Secondary | ICD-10-CM

## 2012-01-13 DIAGNOSIS — E785 Hyperlipidemia, unspecified: Secondary | ICD-10-CM

## 2012-01-13 DIAGNOSIS — I359 Nonrheumatic aortic valve disorder, unspecified: Secondary | ICD-10-CM | POA: Insufficient documentation

## 2012-01-13 DIAGNOSIS — I369 Nonrheumatic tricuspid valve disorder, unspecified: Secondary | ICD-10-CM | POA: Insufficient documentation

## 2012-01-13 DIAGNOSIS — I471 Supraventricular tachycardia: Secondary | ICD-10-CM

## 2012-01-13 DIAGNOSIS — R002 Palpitations: Secondary | ICD-10-CM | POA: Insufficient documentation

## 2012-01-13 DIAGNOSIS — I10 Essential (primary) hypertension: Secondary | ICD-10-CM | POA: Insufficient documentation

## 2012-01-13 NOTE — Progress Notes (Signed)
Echocardiogram performed.  

## 2012-03-22 ENCOUNTER — Other Ambulatory Visit: Payer: Self-pay | Admitting: *Deleted

## 2012-03-22 MED ORDER — FUROSEMIDE 40 MG PO TABS: 40.0000 mg | ORAL_TABLET | ORAL | Status: DC | PRN

## 2012-05-02 ENCOUNTER — Ambulatory Visit (INDEPENDENT_AMBULATORY_CARE_PROVIDER_SITE_OTHER): Payer: Medicare Other | Admitting: Nurse Practitioner

## 2012-05-02 ENCOUNTER — Encounter: Payer: Self-pay | Admitting: Nurse Practitioner

## 2012-05-02 VITALS — BP 170/90 | HR 80 | Ht 71.0 in | Wt 317.1 lb

## 2012-05-02 DIAGNOSIS — R609 Edema, unspecified: Secondary | ICD-10-CM

## 2012-05-02 DIAGNOSIS — I10 Essential (primary) hypertension: Secondary | ICD-10-CM

## 2012-05-02 LAB — CBC WITH DIFFERENTIAL/PLATELET
Basophils Absolute: 0.1 10*3/uL (ref 0.0–0.1)
Basophils Relative: 1 % (ref 0.0–3.0)
Eosinophils Absolute: 0.1 10*3/uL (ref 0.0–0.7)
Eosinophils Relative: 2.5 % (ref 0.0–5.0)
HCT: 34.4 % — ABNORMAL LOW (ref 36.0–46.0)
Hemoglobin: 11.5 g/dL — ABNORMAL LOW (ref 12.0–15.0)
Lymphocytes Relative: 28.7 % (ref 12.0–46.0)
Lymphs Abs: 1.5 10*3/uL (ref 0.7–4.0)
MCHC: 33.4 g/dL (ref 30.0–36.0)
MCV: 79.3 fl (ref 78.0–100.0)
Monocytes Absolute: 0.4 10*3/uL (ref 0.1–1.0)
Monocytes Relative: 7.6 % (ref 3.0–12.0)
Neutro Abs: 3.2 10*3/uL (ref 1.4–7.7)
Neutrophils Relative %: 60.2 % (ref 43.0–77.0)
Platelets: 216 10*3/uL (ref 150.0–400.0)
RBC: 4.34 Mil/uL (ref 3.87–5.11)
RDW: 15.9 % — ABNORMAL HIGH (ref 11.5–14.6)
WBC: 5.4 10*3/uL (ref 4.5–10.5)

## 2012-05-02 LAB — BASIC METABOLIC PANEL
BUN: 15 mg/dL (ref 6–23)
CO2: 26 mEq/L (ref 19–32)
Calcium: 9 mg/dL (ref 8.4–10.5)
Chloride: 107 mEq/L (ref 96–112)
Creatinine, Ser: 0.8 mg/dL (ref 0.4–1.2)
GFR: 88.58 mL/min (ref >60.00)
Glucose, Bld: 113 mg/dL — ABNORMAL HIGH (ref 70–99)
Potassium: 3.3 mEq/L — ABNORMAL LOW (ref 3.5–5.1)
Sodium: 141 mEq/L (ref 135–145)

## 2012-05-02 LAB — LIPID PANEL
Cholesterol: 148 mg/dL (ref 0–200)
HDL: 47.7 mg/dL (ref >39.00)
LDL Cholesterol: 90 mg/dL (ref 0–99)
Total CHOL/HDL Ratio: 3
Triglycerides: 51 mg/dL (ref 0.0–149.0)
VLDL: 10.2 mg/dL (ref 0.0–40.0)

## 2012-05-02 LAB — HEPATIC FUNCTION PANEL
ALT: 18 U/L (ref 0–35)
AST: 23 U/L (ref 0–37)
Albumin: 3.8 g/dL (ref 3.5–5.2)
Alkaline Phosphatase: 51 U/L (ref 39–117)
Bilirubin, Direct: 0.1 mg/dL (ref 0.0–0.3)
Total Bilirubin: 0.5 mg/dL (ref 0.3–1.2)
Total Protein: 8 g/dL (ref 6.0–8.3)

## 2012-05-02 MED ORDER — METOPROLOL TARTRATE 100 MG PO TABS: 100.0000 mg | ORAL_TABLET | Freq: Two times a day (BID) | ORAL | Status: DC

## 2012-05-02 MED ORDER — OMEPRAZOLE 20 MG PO CPDR: 20.0000 mg | DELAYED_RELEASE_CAPSULE | Freq: Two times a day (BID) | ORAL | Status: DC

## 2012-05-02 NOTE — Patient Instructions (Addendum)
Stay on your current medicines but we are going to increase the Metoprolol to 100 mg two times a day - you can take 2 of your 50 mg tabs two times a day to use up. The prescription for the 100 mg is at the drug store.  Also, increase your Omeprazole to two times a day. This is also at the drug store  I think it is great that you have lost weight!!! Keep up the good work  Dr. Sw[place] will see you in a month. Take your medicines that day before your appointment  We will check labs today  Avoid salt  Call the [facility] office at [phone] if you have any questions, problems or concerns.

## 2012-05-02 NOTE — Progress Notes (Signed)
[name] [name] Date of Birth: 06-20-1945 Medical Record #[id number]  History of Present Illness: [name] [name] is seen back today for a 4 month check. She is seen for Dr. [place]. She has had documented narrow complex tachycardia with a rate in the 120's. Unclear as to what the mechanism was and has done better with beta blocker therapy. She is morbidly obese, has chronic issues with swelling, HTN and chronic pain.  I saw her back in September. She was doing ok. We were able to get her echo completed once she got her insurance. EF is normal. She does have mild AS.   She comes back today. She is here alone. She is doing ok. Says she continues to have some discomfort in her upper right chest. This is not exertional. Not always associated with a sour taste in her mouth. Some reflux reported. Comes and goes. Will last about 5 minutes. Just goes away on its on. Sometimes feels a little dizzy. Occasionally with some fast heart beating. She has lost 30 pounds. Says she is eating less. Still with swelling of her legs that is chronic. She has had no medicines today. Does not check her BP at home or when out of her house.   Current Outpatient Prescriptions on File Prior to Visit  Medication Sig Dispense Refill  . aspirin 81 MG tablet Take 81 mg by mouth daily.      . furosemide (LASIX) 40 MG tablet Take 1 tablet (40 mg total) by mouth as needed.  30 tablet  3  . lisinopril (PRINIVIL,ZESTRIL) 20 MG tablet Take 1 tablet (20 mg total) by mouth daily.  30 tablet  11  . omeprazole (PRILOSEC) 20 MG capsule Take 1 capsule (20 mg total) by mouth 2 (two) times daily.  60 capsule  11    Allergies  Allergen Reactions  . Oxycodone Hcl Er   . Penicillins     Past Medical History  Diagnosis Date  . Edema   . Palpitations   . History of diastolic dysfunction     Echo 07/979 with diastolic dysfunction  . Arthritis   . OA (osteoarthritis)   . PAC (premature atrial contraction)     per prior Holter  . PVC's  (premature ventricular contractions)     per prior Holter  . Morbid obesity   . HTN (hypertension)   . Tachycardia     noted at 07/28/11 visit. Started on beta blocker. Possible atrial flutter vs long PR tachycardia/AVNRT    Past Surgical History  Procedure Date  . Cesarean section   . US echocardiography 07/25/2008    EF 55-60%    History  Smoking status  . Former Smoker  . Quit date: 11/23/1985  Smokeless tobacco  . Not on file    History  Alcohol Use No    Family History  Problem Relation Age of Onset  . Alzheimer's disease Mother   . Lung cancer Mother   . COPD Father     Review of Systems: The review of systems is per the HPI.  All other systems were reviewed and are negative.  Physical Exam: BP 170/90  Pulse 80  Ht 5\' 11"  (1.803 m)  Wt 317 lb 1.9 oz (143.845 kg)  BMI 44.23 kg/m2 She is down 30 pounds today. She has had none of her medicines today.  Patient is very pleasant and in no acute distress. She is morbidly obese. She has difficulty ambulating. She is short of breath with walking here into  the office today but is fine at rest. Skin is warm and dry. Color is normal.  HEENT is unremarkable. Normocephalic/atraumatic. PERRL. Sclera are nonicteric. Neck is supple. No masses. No JVD. Lungs are fairly clear. Cardiac exam shows a regular rate and rhythm. She has an outflow murmur noted. Adomen is morbidly obese but soft. Extremities are quite full and with edema which is chronic. Gait and ROM are intact. No gross neurologic deficits noted.  LABORATORY DATA:  Lab Results  Component Value Date   WBC 5.3 12/31/2011   HGB 11.6* 12/31/2011   HCT 36.5 12/31/2011   PLT 230.0 12/31/2011   GLUCOSE 108* 12/31/2011   CHOL 165 12/31/2011   TRIG 71.0 12/31/2011   HDL 45.90 12/31/2011   LDLCALC 105* 12/31/2011   ALT 12 12/31/2011   AST 20 12/31/2011   NA 139 12/31/2011   K 3.6 12/31/2011   CL 106 12/31/2011   CREATININE 0.9 12/31/2011   BUN 13 12/31/2011   CO2 25 12/31/2011    TSH 3.93 07/28/2011   Echo Study Conclusions from 2013 - Left ventricle: The cavity size was mildly dilated. Systolic function was normal. The estimated ejection fraction was in the range of 60% to 65%. - Aortic valve: There was very mild stenosis.    Assessment / Plan: 1. HTN - she has not had any medicines today. I suspect that she really does not have good control. I am increasing her Lopressor to 100 mg a day.   2. Past history of narrow complex tachycardia - maintained on chronic beta blocker therapy - her dose is increased today due to elevated BP and some continued palpitations.   3. Chest pain - seems very atypical. Will increase her BB. Increase her PPI. No symptoms with exertion. If she fails to improve, will need further testing.   4. Swelling - chronic - unchanged.  5. Obesity - she is down 30 pounds. I have encouraged her to continue.   She will see Dr. Sw[place]il[place] in a month. Check labs today. She is to take her medicines prior to her next visit.   Patient is agreeable to this plan and will call if any problems develop in the interim.

## 2012-06-03 ENCOUNTER — Telehealth: Payer: Self-pay | Admitting: Nurse Practitioner

## 2012-06-03 NOTE — Telephone Encounter (Signed)
  Discussed with Sunday Spillers she does not feel that the increase of Metoprolol or Omeprazole would be the cause of the patient's symptoms.  I have called her and left her a message to call her PCP to follow up.  If she has further questions she can call me back

## 2012-06-03 NOTE — Telephone Encounter (Signed)
New problem   Pt stated [name] [name] had changed her medicines and now she is coughing and choking she doesn't know if her medicines is causing it. Would like to speak to a nurse to discuss the problem.

## 2012-06-07 ENCOUNTER — Ambulatory Visit: Payer: Medicare Other | Admitting: Cardiology

## 2012-07-01 ENCOUNTER — Telehealth: Payer: Self-pay | Admitting: Nurse Practitioner

## 2012-07-01 MED ORDER — LOSARTAN POTASSIUM 100 MG PO TABS: 100.0000 mg | ORAL_TABLET | Freq: Every day | ORAL | Status: DC

## 2012-07-01 NOTE — Telephone Encounter (Signed)
[name] [name], Could you call her and talk to her about this?

## 2012-07-01 NOTE — Telephone Encounter (Signed)
New Prob   Metoprolol and omeprazole were recently increased and pt is now c/o of wheezing and coughing. Would like to speak to you regarding these two meds.

## 2012-07-01 NOTE — Telephone Encounter (Signed)
Spoke to patient she stated she has had a non productive cough since she started on lisinopril.Stated has been taking lisinopril for 1 year and cough is getting worse.Spoke to Norma Fredrickson NP she advised to stop lisinopril and start lorsartan 100 mg daily.Patient advised to see PCP if cough continues.

## 2012-07-29 ENCOUNTER — Ambulatory Visit (INDEPENDENT_AMBULATORY_CARE_PROVIDER_SITE_OTHER): Payer: Medicare Other | Admitting: Cardiology

## 2012-07-29 ENCOUNTER — Encounter: Payer: Self-pay | Admitting: Cardiology

## 2012-07-29 VITALS — BP 146/82 | HR 60 | Ht 71.0 in | Wt 318.8 lb

## 2012-07-29 DIAGNOSIS — I872 Venous insufficiency (chronic) (peripheral): Secondary | ICD-10-CM

## 2012-07-29 DIAGNOSIS — I10 Essential (primary) hypertension: Secondary | ICD-10-CM

## 2012-07-29 DIAGNOSIS — I878 Other specified disorders of veins: Secondary | ICD-10-CM

## 2012-07-29 DIAGNOSIS — I471 Supraventricular tachycardia: Secondary | ICD-10-CM

## 2012-07-29 DIAGNOSIS — I498 Other specified cardiac arrhythmias: Secondary | ICD-10-CM

## 2012-07-29 MED ORDER — FUROSEMIDE 40 MG PO TABS: 40.0000 mg | ORAL_TABLET | ORAL | Status: DC | PRN

## 2012-07-29 MED ORDER — LOSARTAN POTASSIUM 100 MG PO TABS: 100.0000 mg | ORAL_TABLET | Freq: Every day | ORAL | Status: DC

## 2012-07-29 MED ORDER — OMEPRAZOLE 20 MG PO CPDR: 20.0000 mg | DELAYED_RELEASE_CAPSULE | Freq: Two times a day (BID) | ORAL | Status: DC

## 2012-07-29 MED ORDER — METOPROLOL TARTRATE 100 MG PO TABS: 100.0000 mg | ORAL_TABLET | Freq: Two times a day (BID) | ORAL | Status: DC

## 2012-07-29 NOTE — Progress Notes (Signed)
[name] Date of Birth: 1945/11/10 Medical Record #[id number]  History of Present Illness: [name] is seen back today for a follow up visit. She has had documented narrow complex tachycardia with a rate in the 120's. Unclear as to what the mechanism was and has done better with beta blocker therapy. She is morbidly obese, has chronic venous insufficiency and edema, HTN and chronic pain. She was having difficulty with a lot of coughing. She was switched from lisinopril to losartan and her cough is significantly better. It has not completely resolved. She complains that she gets winded when she walks or goes up steps. This is relieved with rest. She is very sedentary. She has severe problems with her knees. She has had minimal symptoms of tachycardia.  Current Outpatient Prescriptions on File Prior to Visit  Medication Sig Dispense Refill  . aspirin 81 MG tablet Take 81 mg by mouth daily.       No current facility-administered medications on file prior to visit.    Allergies  Allergen Reactions  . Oxycodone Hcl Er   . Penicillins     Past Medical History  Diagnosis Date  . Edema   . Palpitations   . History of diastolic dysfunction     Echo 12/6293 with diastolic dysfunction  . Arthritis   . OA (osteoarthritis)   . PAC (premature atrial contraction)     per prior Holter  . PVC's (premature ventricular contractions)     per prior Holter  . Morbid obesity   . HTN (hypertension)   . Tachycardia     noted at 07/28/11 visit. Started on beta blocker. Possible atrial flutter vs long PR tachycardia/AVNRT    Past Surgical History  Procedure Laterality Date  . Cesarean section    . US echocardiography  07/25/2008    EF 55-60%    History  Smoking status  . Former Smoker  . Quit date: 11/23/1985  Smokeless tobacco  . Not on file    History  Alcohol Use No    Family History  Problem Relation Age of Onset  . Alzheimer's disease Mother   . Lung cancer Mother   .  COPD Father     Review of Systems: The review of systems is per the HPI.  All other systems were reviewed and are negative.  Physical Exam: BP 146/82  Pulse 60  Ht 5\' 11"  (1.803 m)  Wt 318 lb 12.8 oz (144.607 kg)  BMI 44.48 kg/m2  SpO2 97% Patient is very pleasant and in no acute distress. She is morbidly obese. She has difficulty ambulating.  Skin is warm and dry. Color is normal.  HEENT is unremarkable. Normocephalic/atraumatic. PERRL. Sclera are nonicteric. Neck is supple. No masses. No JVD. Lungs are fairly clear. Cardiac exam shows a regular rate and rhythm.  there is a soft outflow murmur.  [place]en is morbidly obese but soft. Extremities are quite full and with edema which is chronic. Gait and ROM are intact. No gross neurologic deficits noted.  LABORATORY DATA:  Lab Results  Component Value Date   WBC 5.4 05/02/2012   HGB 11.5* 05/02/2012   HCT 34.4* 05/02/2012   PLT 216.0 05/02/2012   GLUCOSE 113* 05/02/2012   CHOL 148 05/02/2012   TRIG 51.0 05/02/2012   HDL 47.70 05/02/2012   LDLCALC 90 05/02/2012   ALT 18 05/02/2012   AST 23 05/02/2012   NA 141 05/02/2012   K 3.3* 05/02/2012   CL 107 05/02/2012   CREATININE 0.8  05/02/2012   BUN 15 05/02/2012   CO2 26 05/02/2012   TSH 3.93 07/28/2011   Echo Study Conclusions from 2013 - Left ventricle: The cavity size was mildly dilated. Systolic function was normal. The estimated ejection fraction was in the range of 60% to 65%. - Aortic valve: There was very mild stenosis.    Assessment / Plan: 1. HTN -  blood pressure well controlled on current therapy.  2. Past history of narrow complex tachycardia - maintained on chronic beta blocker therapy With improvement.  3. Cough. Mostly related to ACE inhibitor. Improved.  4. Swelling - chronic -  related to severe venous insufficiency.  5.  morbid obesity.  6. Dyspnea. I think this is related to her morbid obesity and deconditioning. Consider possible obstructive sleep apnea. Discussed  possible sleep study but at this time the patient prefers.  We will continue her current cardiac therapy. She needs to get establish with primary care and we will assist her with this.

## 2012-08-02 ENCOUNTER — Encounter: Payer: Self-pay | Admitting: *Deleted

## 2012-09-07 ENCOUNTER — Ambulatory Visit: Payer: Medicare Other | Admitting: Internal Medicine

## 2012-09-30 ENCOUNTER — Other Ambulatory Visit: Payer: Self-pay

## 2012-09-30 MED ORDER — LOSARTAN POTASSIUM 100 MG PO TABS: 100.0000 mg | ORAL_TABLET | Freq: Every day | ORAL | Status: DC

## 2012-09-30 MED ORDER — FUROSEMIDE 40 MG PO TABS: 40.0000 mg | ORAL_TABLET | ORAL | Status: DC | PRN

## 2012-09-30 MED ORDER — METOPROLOL TARTRATE 100 MG PO TABS: 100.0000 mg | ORAL_TABLET | Freq: Two times a day (BID) | ORAL | Status: DC

## 2012-09-30 MED ORDER — OMEPRAZOLE 20 MG PO CPDR: 20.0000 mg | DELAYED_RELEASE_CAPSULE | Freq: Two times a day (BID) | ORAL | Status: DC

## 2012-09-30 NOTE — Telephone Encounter (Signed)
Ordered Medications      Disp Refills Start End   metoprolol (LOPRESSOR) 100 MG tablet (Discontinued) 60 tablet 11 07/29/2012 09/30/2012   Take 1 tablet (100 mg total) by mouth 2 (two) times daily. - Oral   Reason for Discontinue: Reorder   losartan (COZAAR) 100 MG tablet 30 tablet 11 07/29/2012    Take 1 tablet (100 mg total) by mouth daily. - Oral   furosemide (LASIX) 40 MG tablet 30 tablet 11 07/29/2012    Take 1 tablet (40 mg total) by mouth as needed. - Oral   omeprazole (PRILOSEC) 20 MG capsule 60 capsule 11 07/29/2012 07/29/2013   Take 1 capsule (20 mg total) by mouth 2 (two) times daily. - Oral

## 2012-09-30 NOTE — Telephone Encounter (Signed)
Ordered Medications      Disp Refills Start End    metoprolol (LOPRESSOR) 100 MG tablet 60 tablet 11 07/29/2012      Take 1 tablet (100 mg total) by mouth 2 (two) times daily. - Oral

## 2012-10-19 ENCOUNTER — Ambulatory Visit (INDEPENDENT_AMBULATORY_CARE_PROVIDER_SITE_OTHER): Payer: Medicare Other | Admitting: Internal Medicine

## 2012-10-19 ENCOUNTER — Encounter: Payer: Self-pay | Admitting: Internal Medicine

## 2012-10-19 ENCOUNTER — Other Ambulatory Visit: Payer: Self-pay | Admitting: Internal Medicine

## 2012-10-19 ENCOUNTER — Other Ambulatory Visit (INDEPENDENT_AMBULATORY_CARE_PROVIDER_SITE_OTHER): Payer: Medicare Other

## 2012-10-19 VITALS — BP 130/74 | HR 76 | Temp 98.7°F | Ht 71.0 in | Wt 367.4 lb

## 2012-10-19 DIAGNOSIS — M1990 Unspecified osteoarthritis, unspecified site: Secondary | ICD-10-CM

## 2012-10-19 DIAGNOSIS — N3946 Mixed incontinence: Secondary | ICD-10-CM

## 2012-10-19 DIAGNOSIS — E876 Hypokalemia: Secondary | ICD-10-CM

## 2012-10-19 DIAGNOSIS — E669 Obesity, unspecified: Secondary | ICD-10-CM

## 2012-10-19 DIAGNOSIS — M129 Arthropathy, unspecified: Secondary | ICD-10-CM

## 2012-10-19 DIAGNOSIS — I10 Essential (primary) hypertension: Secondary | ICD-10-CM

## 2012-10-19 DIAGNOSIS — Z23 Encounter for immunization: Secondary | ICD-10-CM

## 2012-10-19 DIAGNOSIS — K219 Gastro-esophageal reflux disease without esophagitis: Secondary | ICD-10-CM

## 2012-10-19 DIAGNOSIS — R609 Edema, unspecified: Secondary | ICD-10-CM

## 2012-10-19 LAB — CBC
HCT: 34.8 % — ABNORMAL LOW (ref 36.0–46.0)
Hemoglobin: 11.5 g/dL — ABNORMAL LOW (ref 12.0–15.0)
MCHC: 33 g/dL (ref 30.0–36.0)
MCV: 79.8 fl (ref 78.0–100.0)
Platelets: 214 10*3/uL (ref 150.0–400.0)
RBC: 4.36 Mil/uL (ref 3.87–5.11)
RDW: 16.5 % — ABNORMAL HIGH (ref 11.5–14.6)
WBC: 6.7 10*3/uL (ref 4.5–10.5)

## 2012-10-19 LAB — BASIC METABOLIC PANEL
BUN: 12 mg/dL (ref 6–23)
CO2: 27 mEq/L (ref 19–32)
Calcium: 9.3 mg/dL (ref 8.4–10.5)
Chloride: 108 mEq/L (ref 96–112)
Creatinine, Ser: 0.9 mg/dL (ref 0.4–1.2)
GFR: 80.56 mL/min (ref >60.00)
Glucose, Bld: 118 mg/dL — ABNORMAL HIGH (ref 70–99)
Potassium: 4.1 mEq/L (ref 3.5–5.1)
Sodium: 140 mEq/L (ref 135–145)

## 2012-10-19 LAB — HEMOGLOBIN A1C: Hgb A1c MFr Bld: 6.7 % — ABNORMAL HIGH (ref 4.6–6.5)

## 2012-10-19 MED ORDER — MIRABEGRON ER 25 MG PO TB24: 25.0000 mg | ORAL_TABLET | Freq: Every day | ORAL | Status: DC

## 2012-10-19 MED ORDER — NAPROXEN 250 MG PO TABS: 250.0000 mg | ORAL_TABLET | Freq: Two times a day (BID) | ORAL | Status: DC

## 2012-10-19 MED ORDER — METFORMIN HCL 500 MG PO TABS: 500.0000 mg | ORAL_TABLET | Freq: Two times a day (BID) | ORAL | Status: DC

## 2012-10-19 NOTE — Assessment & Plan Note (Signed)
Work on diet and exercis Consider bariatric surgery

## 2012-10-19 NOTE — Progress Notes (Signed)
HPI Pt presents to the clinic today to establish care. She does not have a PCP. She does see cardiology for her heart issues. She does have some concerns today. 1- bladder incontinence. Sometimes she is not able to make it to the restroom  In time. She does leak a lot. She has to wear pads. She also leaks when she sneezes or coughs. It has been worse since she has been on the lasix. She has never been treated for this in the past. 2- She has swelling of her legs. She does have a history of diastolic dysfunction and is on lasix. She does not f/u with cardiology until 12/2012. She does wear compression hose but feels they do not help.  Flu: 2011 Pneumovax: unsure Tetanus: unsure of date Zostavax: no history of chicken pox LMP: postmenopausal Pap Smear: more than 5 years ago Mammogram: more than 5 years ago Colonoscopy: never had one Eye doctor: yearly Dentist: as needed    Past Medical History  Diagnosis Date  . Edema   . Palpitations   . History of diastolic dysfunction     Echo 04/6107 with diastolic dysfunction  . Arthritis   . OA (osteoarthritis)   . PAC (premature atrial contraction)     per prior Holter  . PVC's (premature ventricular contractions)     per prior Holter  . Morbid obesity   . HTN (hypertension)   . Tachycardia     noted at 07/28/11 visit. Started on beta blocker. Possible atrial flutter vs long PR tachycardia/AVNRT    Current Outpatient Prescriptions  Medication Sig Dispense Refill  . aspirin 81 MG tablet Take 81 mg by mouth daily.      . furosemide (LASIX) 40 MG tablet Take 1 tablet (40 mg total) by mouth as needed.  90 tablet  3  . metoprolol (LOPRESSOR) 100 MG tablet Take 1 tablet (100 mg total) by mouth 2 (two) times daily.  180 tablet  3  . omeprazole (PRILOSEC) 20 MG capsule Take 1 capsule (20 mg total) by mouth 2 (two) times daily.  180 capsule  3  . losartan (COZAAR) 100 MG tablet Take 1 tablet (100 mg total) by mouth daily.  90 tablet  3   No current  facility-administered medications for this visit.    Allergies  Allergen Reactions  . Oxycodone Hcl Er   . Penicillins     Family History  Problem Relation Age of Onset  . Alzheimer's disease Mother   . Lung cancer Mother   . COPD Father   . Diabetes Maternal Uncle   . Stroke Neg Hx     History   Social History  . Marital Status: Single    Spouse Name: N/A    Number of Children: 2  . Years of Education: N/A   Occupational History  . 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 Smoking status: Former Smoker    Quit date: 11/23/1985  . Smokeless tobacco: Not on file  . Alcohol Use: No  . Drug Use: No  . Sexually Active: No   Other Topics Concern  . Not on file   Social History Narrative  . No narrative on file    ROS:  Constitutional: Pt reports fatigue. Denies fever, malaise, headache or abrupt weight changes.  HEENT: Denies eye pain, eye redness, ear pain, ringing in the ears, wax buildup, runny nose, nasal congestion, bloody nose, or sore throat. Respiratory: Denies difficulty breathing, shortness  of breath, cough or sputum production.   Cardiovascular: Denies chest pain, chest tightness, palpitations or swelling in the hands or feet.  Gastrointestinal: Pt reports constipation. Denies abdominal pain, bloating, diarrhea or blood in the stool.  GU: Pt reports incontinence. Denies frequency, urgency, pain with urination, blood in urine, odor or discharge. Musculoskeletal: Pt reports arthritis in knees and back. Denies decrease in range of motion, muscle pain or joint  swelling.  Skin: Denies redness, rashes, lesions or ulcercations.  Neurological: Denies dizziness, difficulty with memory, difficulty with speech or problems with balance and coordination.   No other specific complaints in a complete review of systems (except as listed in HPI above).  PE:  BP 130/74  Pulse 76  Temp(Src) 98.7 F (37.1 C) (Oral)  Ht 5\' 11"  (1.803 m)   Wt 367 lb 6 oz (166.64 kg)  BMI 51.26 kg/m2  SpO2 96% Wt Readings from Last 3 Encounters:  10/19/12 367 lb 6 oz (166.64 kg)  07/29/12 318 lb 12.8 oz (144.607 kg)  05/02/12 317 lb 1.9 oz (143.845 kg)    General: Appears her stated age, obese, chronically ill appearing in NAD. HEENT: Head: normal shape and size; Eyes: sclera white, no icterus, conjunctiva pink, PERRLA and EOMs intact; Ears: Tm's gray and intact, normal light reflex; Nose: mucosa pink and moist, septum midline; Throat/Mouth: Teeth present, mucosa pink and moist, no lesions or ulcerations noted.  Neck: Normal range of motion. Neck supple, trachea midline. No massses, lumps or thyromegaly present.  Cardiovascular: Normal rate and rhythm. S1,S2 noted.  Murmur noted. No rubs or gallops noted. No JVD. Extensive BLE edema. No carotid bruits noted. Pulmonary/Chest: Normal effort and positive vesicular breath sounds. No respiratory distress. No wheezes, rales or ronchi noted.  Abdomen: Soft and nontender. Normal bowel sounds, no bruits noted. No distention or masses noted. Liver, spleen and kidneys non palpable. Musculoskeletal: Normal range of motion. No signs of joint swelling. Mild difficulty with gait, uses cane for assistance. Neurological: Alert and oriented. Cranial nerves II-XII intact. Coordination normal. +DTRs bilaterally. Psychiatric: Mood and affect normal. Behavior is normal. Judgment and thought content normal.     BMET    Component Value Date/Time   NA 141 05/02/2012 1025   K 3.3* 05/02/2012 1025   CL 107 05/02/2012 1025   CO2 26 05/02/2012 1025   GLUCOSE 113* 05/02/2012 1025   BUN 15 05/02/2012 1025   CREATININE 0.8 05/02/2012 1025   CALCIUM 9.0 05/02/2012 1025   GFRNONAA >60 12/17/2006 2155   GFRAA  Value: >60        The eGFR has been calculated using the MDRD equation. This calculation has not been validated in all clinical 12/17/2006 2155    Lipid Panel     Component Value Date/Time   CHOL 148 05/02/2012 1025    TRIG 51.0 05/02/2012 1025   HDL 47.70 05/02/2012 1025   CHOLHDL 3 05/02/2012 1025   VLDL 10.2 05/02/2012 1025   LDLCALC 90 05/02/2012 1025    CBC    Component Value Date/Time   WBC 5.4 05/02/2012 1025   RBC 4.34 05/02/2012 1025   HGB 11.5* 05/02/2012 1025   HCT 34.4* 05/02/2012 1025   PLT 216.0 05/02/2012 1025   MCV 79.3 05/02/2012 1025   MCHC 33.4 05/02/2012 1025   RDW 15.9* 05/02/2012 1025   LYMPHSABS 1.5 05/02/2012 1025   MONOABS 0.4 05/02/2012 1025   EOSABS 0.1 05/02/2012 1025   BASOSABS 0.1 05/02/2012 1025    Hgb A1C No results  found for this basename: HGBA1C     Assessment and Plan:  Health Maintenance:  Reviewed PMH, FH, SH, allergies, medications and most recent cardiology notes. Data to be reviewed approx 20 minutes  Tdap and Pneumovax given today Pt declines setting up for pap smear, mammogram or colonoscopy Encouraged pt to vist dentist at least annually

## 2012-10-19 NOTE — Assessment & Plan Note (Signed)
Well controlled Continue current therapy 

## 2012-10-19 NOTE — Assessment & Plan Note (Signed)
Per pt, she is on a potassium supplement Will recheck potassium today

## 2012-10-19 NOTE — Assessment & Plan Note (Signed)
Will start Naproxen Continue using cane for assistance Would benefit from weight loss

## 2012-10-19 NOTE — Assessment & Plan Note (Signed)
Well controlled Continue current medications for now Work on diet and exercise

## 2012-10-19 NOTE — Assessment & Plan Note (Signed)
Will start myrbetriq

## 2012-10-19 NOTE — Patient Instructions (Signed)

## 2012-10-19 NOTE — Assessment & Plan Note (Signed)
Marked weight gain since 04/2012 50 lb Will talk with Luna Glasgow, may need increase in lasix

## 2012-10-28 ENCOUNTER — Telehealth: Payer: Self-pay | Admitting: *Deleted

## 2012-10-28 ENCOUNTER — Other Ambulatory Visit: Payer: Self-pay | Admitting: Internal Medicine

## 2012-10-28 DIAGNOSIS — L97901 Non-pressure chronic ulcer of unspecified part of unspecified lower leg limited to breakdown of skin: Secondary | ICD-10-CM

## 2012-10-28 NOTE — Telephone Encounter (Signed)
Pt called states the ulcer on her leg smells.  She is requesting a cleanser.  Please advise

## 2012-10-28 NOTE — Telephone Encounter (Signed)
Spoke with pt advised her of Regina's message.

## 2012-10-28 NOTE — Telephone Encounter (Signed)
She should be seen by the wound clinic. I will place a referral for her.

## 2012-11-04 ENCOUNTER — Encounter: Payer: Self-pay | Admitting: Internal Medicine

## 2012-11-04 ENCOUNTER — Ambulatory Visit (INDEPENDENT_AMBULATORY_CARE_PROVIDER_SITE_OTHER): Payer: Medicare Other | Admitting: Internal Medicine

## 2012-11-04 VITALS — BP 132/78 | HR 84 | Temp 98.6°F

## 2012-11-04 DIAGNOSIS — L03113 Cellulitis of right upper limb: Secondary | ICD-10-CM

## 2012-11-04 DIAGNOSIS — K5900 Constipation, unspecified: Secondary | ICD-10-CM

## 2012-11-04 DIAGNOSIS — L02519 Cutaneous abscess of unspecified hand: Secondary | ICD-10-CM

## 2012-11-04 MED ORDER — METHYLPREDNISOLONE ACETATE 80 MG/ML IJ SUSP
80.0000 mg | Freq: Once | INTRAMUSCULAR | Status: AC
  Administered 2012-11-04: 80 mg via INTRAMUSCULAR

## 2012-11-04 MED ORDER — METHYLPREDNISOLONE ACETATE 40 MG/ML IJ SUSP
40.0000 mg | Freq: Once | INTRAMUSCULAR | Status: AC
  Administered 2012-11-04: 40 mg via INTRAMUSCULAR

## 2012-11-04 MED ORDER — PREDNISONE 10 MG PO TABS: ORAL_TABLET | ORAL | Status: DC

## 2012-11-04 MED ORDER — CEFTRIAXONE SODIUM 1 G IJ SOLR
1.0000 g | Freq: Once | INTRAMUSCULAR | Status: AC
  Administered 2012-11-04: 1 g via INTRAMUSCULAR

## 2012-11-04 MED ORDER — SULFAMETHOXAZOLE-TMP DS 800-160 MG PO TABS: 1.0000 | ORAL_TABLET | Freq: Two times a day (BID) | ORAL | Status: DC

## 2012-11-04 NOTE — Addendum Note (Signed)
Addended by: Edwena Felty T on: 11/04/2012 08:32 AM   Modules accepted: Orders

## 2012-11-04 NOTE — Patient Instructions (Signed)
Cellulitis Cellulitis is an infection of the skin and the tissue beneath it. The infected area is usually red and tender. Cellulitis occurs most often in the arms and lower legs.  CAUSES  Cellulitis is caused by bacteria that enter the skin through cracks or cuts in the skin. The most common types of bacteria that cause cellulitis are Staphylococcus and Streptococcus. SYMPTOMS   Redness and warmth.  Swelling.  Tenderness or pain.  Fever. DIAGNOSIS  Your caregiver can usually determine what is wrong based on a physical exam. Blood tests may also be done. TREATMENT  Treatment usually involves taking an antibiotic medicine. HOME CARE INSTRUCTIONS   Take your antibiotics as directed. Finish them even if you start to feel better.  Keep the infected arm or leg elevated to reduce swelling.  Apply a warm cloth to the affected area up to 4 times per day to relieve pain.  Only take over-the-counter or prescription medicines for pain, discomfort, or fever as directed by your caregiver.  Keep all follow-up appointments as directed by your caregiver. SEEK MEDICAL CARE IF:   You notice red streaks coming from the infected area.  Your red area gets larger or turns dark in color.  Your bone or joint underneath the infected area becomes painful after the skin has healed.  Your infection returns in the same area or another area.  You notice a swollen bump in the infected area.  You develop new symptoms. SEEK IMMEDIATE MEDICAL CARE IF:   You have a fever.  You feel very sleepy.  You develop vomiting or diarrhea.  You have a general ill feeling (malaise) with muscle aches and pains. MAKE SURE YOU:   Understand these instructions.  Will watch your condition.  Will get help right away if you are not doing well or get worse. Document Released: 12/31/2004 Document Revised: 09/22/2011 Document Reviewed: 06/08/2011 [employer] Patient Information 2014 [employer].  

## 2012-11-04 NOTE — Progress Notes (Signed)
Subjective:    Patient ID: [name] [name], female    DOB: Oct 08, 1945, 67 y.o.   MRN: [id number]  HPI  Pt presents to the clinic today with c/o pain and swelling in the right hand/wrist. This started 5 days ago. She denies any injury to the area. She does not recall getting cut or bit by any insect. She denies fever, chills or body aches. The pain is worse when she puts pressure on her hand. She has had swelling and redness like this before in her leg but never in her hand. She has not taken anything OTC for this. She did try to wrap it in a ace bandage but it seemed to have gotten worse after that. Additionally, she c/o constipation. She has not had a BM in 4 days. She does have a problem with constipation. She feels like she does not drink enough water. She has not taken anything over the counter yet for this. She denies abdominal pain.  Review of Systems      Past Medical History  Diagnosis Date  . Edema   . Palpitations   . History of diastolic dysfunction     Echo 07/979 with diastolic dysfunction  . Arthritis   . OA (osteoarthritis)   . PAC (premature atrial contraction)     per prior Holter  . PVC's (premature ventricular contractions)     per prior Holter  . Morbid obesity   . HTN (hypertension)   . Tachycardia     noted at 07/28/11 visit. Started on beta blocker. Possible atrial flutter vs long PR tachycardia/AVNRT    Current Outpatient Prescriptions  Medication Sig Dispense Refill  . aspirin 81 MG tablet Take 81 mg by mouth daily.      . furosemide (LASIX) 40 MG tablet Take 1 tablet (40 mg total) by mouth as needed.  90 tablet  3  . losartan (COZAAR) 100 MG tablet Take 1 tablet (100 mg total) by mouth daily.  90 tablet  3  . metFORMIN (GLUCOPHAGE) 500 MG tablet Take 1 tablet (500 mg total) by mouth 2 (two) times daily with a meal.  60 tablet  3  . metoprolol (LOPRESSOR) 100 MG tablet Take 1 tablet (100 mg total) by mouth 2 (two) times daily.  180 tablet  3  . mirabegron  ER (MYRBETRIQ) 25 MG TB24 Take 1 tablet (25 mg total) by mouth daily.  30 tablet  0  . naproxen (NAPROSYN) 250 MG tablet Take 1 tablet (250 mg total) by mouth 2 (two) times daily with a meal.  60 tablet  2  . omeprazole (PRILOSEC) 20 MG capsule Take 1 capsule (20 mg total) by mouth 2 (two) times daily.  180 capsule  3   No current facility-administered medications for this visit.    Allergies  Allergen Reactions  . Oxycodone Hcl Er   . Penicillins     Family History  Problem Relation Age of Onset  . Alzheimer's disease Mother   . Lung cancer Mother   . COPD Father   . Diabetes Maternal Uncle   . Stroke Neg Hx     History   Social History  . Marital Status: Single    Spouse Name: N/A    Number of Children: 2  . Years of Education: N/A   Occupational History  . 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 Smoking status: Former Smoker    Quit date: 11/23/1985  . Smokeless tobacco:  Not on file  . Alcohol Use: No  . Drug Use: No  . Sexually Active: No   Other Topics Concern  . Not on file   Social History Narrative  . No narrative on file     Constitutional: Denies fever, malaise, fatigue, headache or abrupt weight changes.  GI: Pt reports constipation. Denies nausea, vomiting, diarrhea or blood in stool. Musculoskeletal: Pt reports pain/swelling in the right hand. Denies decrease in range of motion, difficulty with gait, muscle pain or joint pain and swelling.  Skin: Pt reports redness of right hand. Denies redness, rashes, lesions or ulcercations.    No other specific complaints in a complete review of systems (except as listed in HPI above).  Objective:   Physical Exam   BP 132/78  Pulse 84  Temp(Src) 98.6 F (37 C) (Oral)  SpO2 98% Wt Readings from Last 3 Encounters:  10/19/12 367 lb 6 oz (166.64 kg)  07/29/12 318 lb 12.8 oz (144.607 kg)  05/02/12 317 lb 1.9 oz (143.845 kg)    General: Appears her stated age, well  developed, well nourished in NAD. Skin: Warm, dry and intact. Extensive cellulitis of right hand noted without abscess. Cardiovascular: Normal rate and rhythm. S1,S2 noted.  No murmur, rubs or gallops noted. No JVD or BLE edema. No carotid bruits noted. Pulmonary/Chest: Normal effort and positive vesicular breath sounds. No respiratory distress. No wheezes, rales or ronchi noted.  Abdomen: Hypoactive bowel sounds, soft but mildly distended, non tender. Musculoskeletal: 4+ swelling of the right hand/wrist noted. Decreased flexion/extension of the wrist.  BMET    Component Value Date/Time   NA 140 10/19/2012 1019   K 4.1 10/19/2012 1019   CL 108 10/19/2012 1019   CO2 27 10/19/2012 1019   GLUCOSE 118* 10/19/2012 1019   BUN 12 10/19/2012 1019   CREATININE 0.9 10/19/2012 1019   CALCIUM 9.3 10/19/2012 1019   GFRNONAA >60 12/17/2006 2155   GFRAA  Value: >60        The eGFR has been calculated using the MDRD equation. This calculation has not been validated in all clinical 12/17/2006 2155    Lipid Panel     Component Value Date/Time   CHOL 148 05/02/2012 1025   TRIG 51.0 05/02/2012 1025   HDL 47.70 05/02/2012 1025   CHOLHDL 3 05/02/2012 1025   VLDL 10.2 05/02/2012 1025   LDLCALC 90 05/02/2012 1025    CBC    Component Value Date/Time   WBC 6.7 10/19/2012 1019   RBC 4.36 10/19/2012 1019   HGB 11.5* 10/19/2012 1019   HCT 34.8* 10/19/2012 1019   PLT 214.0 10/19/2012 1019   MCV 79.8 10/19/2012 1019   MCHC 33.0 10/19/2012 1019   RDW 16.5* 10/19/2012 1019   LYMPHSABS 1.5 05/02/2012 1025   MONOABS 0.4 05/02/2012 1025   EOSABS 0.1 05/02/2012 1025   BASOSABS 0.1 05/02/2012 1025    Hgb A1C Lab Results  Component Value Date   HGBA1C 6.7* 10/19/2012        Assessment & Plan:   Extensive cellulitis of right hand without known injury, new onset:  120 mg Depo IM today 1 gm Rocephin IM today eRx for pred taper and Septra  Constipation, recurrent:  Take Senna and colace over the counter for the next 3  days If no BM by tomorrow, do fleets enema  If not better or any worse in 24 hours, to ER immediately

## 2012-11-16 ENCOUNTER — Encounter (HOSPITAL_BASED_OUTPATIENT_CLINIC_OR_DEPARTMENT_OTHER): Payer: Medicare Other | Attending: General Surgery

## 2012-11-16 DIAGNOSIS — E119 Type 2 diabetes mellitus without complications: Secondary | ICD-10-CM | POA: Insufficient documentation

## 2012-11-16 DIAGNOSIS — I10 Essential (primary) hypertension: Secondary | ICD-10-CM | POA: Insufficient documentation

## 2012-11-16 DIAGNOSIS — I87309 Chronic venous hypertension (idiopathic) without complications of unspecified lower extremity: Secondary | ICD-10-CM | POA: Insufficient documentation

## 2012-11-16 DIAGNOSIS — I890 Lymphedema, not elsewhere classified: Secondary | ICD-10-CM | POA: Insufficient documentation

## 2012-11-16 DIAGNOSIS — Z79899 Other long term (current) drug therapy: Secondary | ICD-10-CM | POA: Insufficient documentation

## 2012-11-24 NOTE — Progress Notes (Signed)
[facility]  NAME:  [name] [name], [name] [name] NO.:  [id number]  MEDICAL RECORD NO.:  [id number]      DATE OF BIRTH:  21-Jan-1946  PHYSICIAN:  Ardath Sax, M.D.      VISIT DATE:  11/23/2012                                  OFFICE VISIT   This is a 67 year old, morbidly obese female, who has severe venous hypertension and severe bilateral lymphedema of her legs.  She also has hypertension and type 2 diabetes.  Her medicines Cozaar and metformin. She is also on Lopressor and Prilosec.  She has marked lymphedema with elephantiasis type skin resulting from her chronic lymphedema.  I feel that lymphedema pumps at home would be of great benefit to her.  She could use them 2 hours a day, and get rid of some of this edema and be able to be more mobile and perhaps lose some weight and help control her diabetes.     Ardath Sax, M.D.     PP/MEDQ  D:  11/23/2012  T:  11/24/2012  Job:  [id number]

## 2012-12-07 ENCOUNTER — Encounter (HOSPITAL_BASED_OUTPATIENT_CLINIC_OR_DEPARTMENT_OTHER): Payer: Medicare Other | Attending: General Surgery

## 2012-12-07 DIAGNOSIS — L97909 Non-pressure chronic ulcer of unspecified part of unspecified lower leg with unspecified severity: Secondary | ICD-10-CM | POA: Insufficient documentation

## 2012-12-07 DIAGNOSIS — I890 Lymphedema, not elsewhere classified: Secondary | ICD-10-CM | POA: Insufficient documentation

## 2012-12-07 DIAGNOSIS — I87319 Chronic venous hypertension (idiopathic) with ulcer of unspecified lower extremity: Secondary | ICD-10-CM | POA: Insufficient documentation

## 2013-01-04 ENCOUNTER — Encounter (HOSPITAL_BASED_OUTPATIENT_CLINIC_OR_DEPARTMENT_OTHER): Payer: Medicare Other | Attending: General Surgery

## 2013-01-04 DIAGNOSIS — L97909 Non-pressure chronic ulcer of unspecified part of unspecified lower leg with unspecified severity: Secondary | ICD-10-CM | POA: Insufficient documentation

## 2013-01-04 DIAGNOSIS — I87319 Chronic venous hypertension (idiopathic) with ulcer of unspecified lower extremity: Secondary | ICD-10-CM | POA: Insufficient documentation

## 2013-01-04 DIAGNOSIS — I890 Lymphedema, not elsewhere classified: Secondary | ICD-10-CM | POA: Insufficient documentation

## 2013-01-16 ENCOUNTER — Other Ambulatory Visit: Payer: Self-pay | Admitting: Internal Medicine

## 2013-01-18 LAB — GLUCOSE, CAPILLARY: Glucose-Capillary: 115 mg/dL — ABNORMAL HIGH (ref 70–99)

## 2013-01-23 ENCOUNTER — Telehealth: Payer: Self-pay

## 2013-01-23 ENCOUNTER — Ambulatory Visit (INDEPENDENT_AMBULATORY_CARE_PROVIDER_SITE_OTHER): Payer: Medicare Other | Admitting: Internal Medicine

## 2013-01-23 ENCOUNTER — Other Ambulatory Visit (INDEPENDENT_AMBULATORY_CARE_PROVIDER_SITE_OTHER): Payer: Medicare Other

## 2013-01-23 ENCOUNTER — Encounter: Payer: Self-pay | Admitting: Internal Medicine

## 2013-01-23 VITALS — BP 120/82 | HR 71 | Temp 97.5°F | Ht 71.0 in | Wt 348.8 lb

## 2013-01-23 DIAGNOSIS — M129 Arthropathy, unspecified: Secondary | ICD-10-CM

## 2013-01-23 DIAGNOSIS — I10 Essential (primary) hypertension: Secondary | ICD-10-CM

## 2013-01-23 DIAGNOSIS — E119 Type 2 diabetes mellitus without complications: Secondary | ICD-10-CM

## 2013-01-23 DIAGNOSIS — Z23 Encounter for immunization: Secondary | ICD-10-CM

## 2013-01-23 DIAGNOSIS — M1990 Unspecified osteoarthritis, unspecified site: Secondary | ICD-10-CM

## 2013-01-23 LAB — HEMOGLOBIN A1C: Hgb A1c MFr Bld: 6.5 % (ref 4.6–6.5)

## 2013-01-23 MED ORDER — TRAMADOL HCL 50 MG PO TABS: 50.0000 mg | ORAL_TABLET | Freq: Two times a day (BID) | ORAL | Status: DC | PRN

## 2013-01-23 NOTE — Assessment & Plan Note (Signed)
Worsened by obesity On naprosyn Will add tramadol Weight loss will help you tremendously

## 2013-01-23 NOTE — Telephone Encounter (Signed)
Patient informed of results.  

## 2013-01-23 NOTE — Progress Notes (Signed)
Subjective:    Patient ID: [name] [name], female    DOB: [birth year], 67 y.o.   MRN: [id number]  HPI  Pt presents to the clinic today for 3 month follow up of DM 2. 3 months ago her A1C was 6.7%. She is obese and has HTN. She was started on Metformin 500 mg BID. She is tolerating the medication well without side effects. She has lost 20 lbs. She also has multiple other complaints today. 1- She has left hand swelling. This started 1 week ago. She thinks it may be arthritis as well. She did take Naprosyn which did help. 2- She is experiencing numbness of her right thigh. This started about 3 months ago. She also c/o lower back pain. This has been a chronic issue for her.  3- She c/o bilateral knee pain. She does have arthritis in both knees. She does take Naproxyn as needed. This helps with the swelling but not with the pain.  Review of Systems      Past Medical History  Diagnosis Date  . Edema   . Palpitations   . History of diastolic dysfunction     Echo 07/979 with diastolic dysfunction  . Arthritis   . OA (osteoarthritis)   . PAC (premature atrial contraction)     per prior Holter  . PVC's (premature ventricular contractions)     per prior Holter  . Morbid obesity   . HTN (hypertension)   . Tachycardia     noted at 07/28/11 visit. Started on beta blocker. Possible atrial flutter vs long PR tachycardia/AVNRT    Current Outpatient Prescriptions  Medication Sig Dispense Refill  . aspirin 81 MG tablet Take 81 mg by mouth daily.      . furosemide (LASIX) 40 MG tablet Take 1 tablet (40 mg total) by mouth as needed.  90 tablet  3  . losartan (COZAAR) 100 MG tablet Take 1 tablet (100 mg total) by mouth daily.  90 tablet  3  . metFORMIN (GLUCOPHAGE) 500 MG tablet Take 1 tablet (500 mg total) by mouth 2 (two) times daily with a meal.  60 tablet  3  . metoprolol (LOPRESSOR) 100 MG tablet Take 1 tablet (100 mg total) by mouth 2 (two) times daily.  180 tablet  3  . mirabegron ER  (MYRBETRIQ) 25 MG TB24 Take 1 tablet (25 mg total) by mouth daily.  30 tablet  0  . naproxen (NAPROSYN) 250 MG tablet Take 1 tablet (250 mg total) by mouth 2 (two) times daily with a meal.  60 tablet  2  . omeprazole (PRILOSEC) 20 MG capsule Take 1 capsule (20 mg total) by mouth 2 (two) times daily.  180 capsule  3  . predniSONE (DELTASONE) 10 MG tablet Take 3 tablets on days 1-2, take 2 tablets on days 3-4, take 1 tablet on days 5-6  12 tablet  0  . sulfamethoxazole-trimethoprim (BACTRIM DS) 800-160 MG per tablet Take 1 tablet by mouth 2 (two) times daily.  14 tablet  0   No current facility-administered medications for this visit.    Allergies  Allergen Reactions  . Oxycodone Hcl   . Penicillins     Family History  Problem Relation Age of Onset  . Alzheimer's disease Mother   . Lung cancer Mother   . COPD Father   . Diabetes Maternal Uncle   . Stroke Neg Hx     History   Social History  . Marital Status: Single    Spouse Name:  N/A    Number of Children: 2  . Years of Education: N/A   Occupational History  . 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 Smoking status: Former Smoker    Quit date: 11/23/1985  . Smokeless tobacco: Not on file  . Alcohol Use: No  . Drug Use: No  . Sexual Activity: No   Other Topics Concern  . Not on file   Social History Narrative  . No narrative on file     Constitutional: Denies fever, malaise, fatigue, headache or abrupt weight changes.  Respiratory: Denies difficulty breathing, shortness of breath, cough or sputum production.   Cardiovascular: Denies chest pain, chest tightness, palpitations or swelling in the hands or feet.  Gastrointestinal: Denies abdominal pain, bloating, constipation, diarrhea or blood in the stool.  Musculoskeletal: Pt reports multiple joint pain. Denies decrease in range of motion, difficulty with gait, muscle pain.  Skin: Denies redness, rashes, lesions or ulcercations.   Neurological: Pt reports numbness of right thigh. Denies dizziness, difficulty with memory, difficulty with speech or problems with balance and coordination.   No other specific complaints in a complete review of systems (except as listed in HPI above).  Objective:   Physical Exam   BP 120/82  Pulse 71  Temp(Src) 97.5 F (36.4 C) (Oral)  Ht 5\' 11"  (1.803 m)  Wt 348 lb 12 oz (158.192 kg)  BMI 48.66 kg/m2  SpO2 97% Wt Readings from Last 3 Encounters:  01/23/13 348 lb 12 oz (158.192 kg)  10/19/12 367 lb 6 oz (166.64 kg)  07/29/12 318 lb 12.8 oz (144.607 kg)    General: Appears her stated age, obese but well developed, well nourished in NAD. Skin: Warm, dry and intact. No rashes, lesions or ulcerations noted. Cardiovascular: Normal rate and rhythm. S1,S2 noted.  No murmur, rubs or gallops noted. No JVD or BLE edema. No carotid bruits noted. Pulmonary/Chest: Normal effort and positive vesicular breath sounds. No respiratory distress. No wheezes, rales or ronchi noted.  Abdomen: Soft and nontender. Normal bowel sounds, no bruits noted. No distention or masses noted. Liver, spleen and kidneys non palpable. Musculoskeletal: Normal range of motion. No signs of joint swelling. Mild difficulty with gait d/t size.  Neurological: Alert and oriented. Cranial nerves II-XII intact. Coordination normal. +DTRs bilaterally. Sensation intact due to 10 gm monofilament.   BMET    Component Value Date/Time   NA 140 10/19/2012 1019   K 4.1 10/19/2012 1019   CL 108 10/19/2012 1019   CO2 27 10/19/2012 1019   GLUCOSE 118* 10/19/2012 1019   BUN 12 10/19/2012 1019   CREATININE 0.9 10/19/2012 1019   CALCIUM 9.3 10/19/2012 1019   GFRNONAA >60 12/17/2006 2155   GFRAA  Value: >60        The eGFR has been calculated using the MDRD equation. This calculation has not been validated in all clinical 12/17/2006 2155    Lipid Panel     Component Value Date/Time   CHOL 148 05/02/2012 1025   TRIG 51.0 05/02/2012 1025    HDL 47.70 05/02/2012 1025   CHOLHDL 3 05/02/2012 1025   VLDL 10.2 05/02/2012 1025   LDLCALC 90 05/02/2012 1025    CBC    Component Value Date/Time   WBC 6.7 10/19/2012 1019   RBC 4.36 10/19/2012 1019   HGB 11.5* 10/19/2012 1019   HCT 34.8* 10/19/2012 1019   PLT 214.0 10/19/2012 1019   MCV 79.8 10/19/2012 1019   MCHC  33.0 10/19/2012 1019   RDW 16.5* 10/19/2012 1019   LYMPHSABS 1.5 05/02/2012 1025   MONOABS 0.4 05/02/2012 1025   EOSABS 0.1 05/02/2012 1025   BASOSABS 0.1 05/02/2012 1025    Hgb A1C Lab Results  Component Value Date   HGBA1C 6.7* 10/19/2012        Assessment & Plan:

## 2013-01-23 NOTE — Assessment & Plan Note (Signed)
Well controlled Continue current therapy 

## 2013-01-23 NOTE — Patient Instructions (Signed)
Diabetes and Standards of Medical Care  Diabetes is complicated. You may find that your diabetes team includes a dietitian, nurse, diabetes educator, eye doctor, and more. To help everyone know what is going on and to help you get the care you deserve, the following schedule of care was developed to help keep you on track. Below are the tests, exams, vaccines, medicines, education, and plans you will need. A1c test  Performed at least 2 times a year if you are meeting treatment goals.  Performed 4 times a year if therapy has changed or if you are not meeting treatment goals. Blood pressure test  Performed at every routine medical visit. The goal is less than 120/80 mmHg. Dental exam  Follow up with the dentist regularly. Eye exam  Diagnosed with type 1 diabetes as a child: Get an exam upon reaching the age of 10 years or older and having had diabetes for 3 5 years. Yearly eye exams are recommended after that initial eye exam.  Diagnosed with type 1 diabetes as an adult: Get an exam within 5 years of diagnosis and then yearly.  Diagnosed with type 2 diabetes: Get an exam as soon as possible after the diagnosis and then yearly. Foot care exam  Visual foot exams are performed at every routine medical visit. The exams check for cuts, injuries, or other problems with the feet.  A comprehensive foot exam should be done yearly. This includes visual inspection as well as assessing foot pulses and testing for loss of sensation. Kidney function test (urine microalbumin)  Performed once a year.  Type 1 diabetes: The first test is performed 5 years after diagnosis.  Type 2 diabetes: The first test is performed at the time of diagnosis.  A serum creatinine and estimated glomerular filtration rate (eGFR) test is done once a year to tell the level of chronic kidney disease (CKD), if present. Lipid profile (Cholesterol, HDL, LDL, Triglycerides)  Performed every 5 years for most people.  The  goal for LDL is less than 100 mg/dl. If at high risk, the goal is less than 70 mg/dl.  The goal for HDL is 40 mg/dl 50 mg/dl for men and 50 mg/dl 60 mg/dl for women. An HDL cholesterol of 60 mg/dL or higher gives some protection against heart disease.  The goal for triglycerides is less than 150 mg/dl. Influenza vaccine, pneumococcal vaccine, and hepatitis B vaccine  The influenza vaccine is recommended yearly.  The pneumococcal vaccine is generally given once in a lifetime. However, there are some instances when another vaccination is recommended. Check with your caregiver.  The hepatitis B vaccine is also recommended for adults with diabetes. Diabetes self-management education  Recommended at diagnosis and ongoing as needed. Treatment plan  Reviewed at every medical visit. Document Released: 01/18/2009 Document Revised: 03/09/2012 Document Reviewed: 09/23/2010 [employer] Patient Information 2014 [employer].  

## 2013-01-23 NOTE — Telephone Encounter (Signed)
Message copied by Eulis Manly on Mon Jan 23, 2013  4:25 PM ------      Message from: Lorre Munroe      Created: Mon Jan 23, 2013 10:52 AM       Please call pt and let her know her A1C is down to 6.5. We will continue the metformin twice daily. No need to check sugars at this time ------

## 2013-01-23 NOTE — Assessment & Plan Note (Signed)
Down 20 lb Unable to exercise d/t joint pain Consider pool aerobics

## 2013-02-02 DIAGNOSIS — E119 Type 2 diabetes mellitus without complications: Secondary | ICD-10-CM | POA: Insufficient documentation

## 2013-02-02 NOTE — Assessment & Plan Note (Signed)
Will recheck A1C today Continue Metformin Foot exam today Eye exam done earlier this year but will need dilated eye exam Microalbumin at next visit On ASA Lipids well controlled- no need for statin at this time Immunizations reviewed and UTD

## 2013-02-08 ENCOUNTER — Encounter (HOSPITAL_BASED_OUTPATIENT_CLINIC_OR_DEPARTMENT_OTHER): Payer: Medicare Other | Attending: General Surgery

## 2013-02-08 DIAGNOSIS — I87309 Chronic venous hypertension (idiopathic) without complications of unspecified lower extremity: Secondary | ICD-10-CM | POA: Insufficient documentation

## 2013-02-08 DIAGNOSIS — L988 Other specified disorders of the skin and subcutaneous tissue: Secondary | ICD-10-CM | POA: Insufficient documentation

## 2013-02-08 DIAGNOSIS — I890 Lymphedema, not elsewhere classified: Secondary | ICD-10-CM | POA: Insufficient documentation

## 2013-02-08 DIAGNOSIS — L84 Corns and callosities: Secondary | ICD-10-CM | POA: Insufficient documentation

## 2013-04-19 ENCOUNTER — Other Ambulatory Visit: Payer: Self-pay | Admitting: Internal Medicine

## 2013-08-02 ENCOUNTER — Emergency Department (HOSPITAL_COMMUNITY): Payer: Medicare Other

## 2013-08-02 ENCOUNTER — Emergency Department (HOSPITAL_COMMUNITY)
Admission: EM | Admit: 2013-08-02 | Discharge: 2013-08-02 | Disposition: A | Discharge status: Home / Self Care | Payer: Medicare Other | Attending: Emergency Medicine | Admitting: Emergency Medicine

## 2013-08-02 ENCOUNTER — Encounter (HOSPITAL_COMMUNITY): Payer: Self-pay | Admitting: Emergency Medicine

## 2013-08-02 ENCOUNTER — Emergency Department (INDEPENDENT_AMBULATORY_CARE_PROVIDER_SITE_OTHER)
Admission: EM | Admit: 2013-08-02 | Discharge: 2013-08-02 | Disposition: A | Discharge status: Other Acute Inpatient Hospital | Payer: Medicare Other | Source: Home / Self Care

## 2013-08-02 DIAGNOSIS — M549 Dorsalgia, unspecified: Secondary | ICD-10-CM

## 2013-08-02 DIAGNOSIS — R0602 Shortness of breath: Secondary | ICD-10-CM | POA: Insufficient documentation

## 2013-08-02 DIAGNOSIS — Z880 Allergy status to penicillin: Secondary | ICD-10-CM | POA: Insufficient documentation

## 2013-08-02 DIAGNOSIS — R0600 Dyspnea, unspecified: Secondary | ICD-10-CM

## 2013-08-02 DIAGNOSIS — Z79899 Other long term (current) drug therapy: Secondary | ICD-10-CM | POA: Insufficient documentation

## 2013-08-02 DIAGNOSIS — R0609 Other forms of dyspnea: Secondary | ICD-10-CM

## 2013-08-02 DIAGNOSIS — I498 Other specified cardiac arrhythmias: Secondary | ICD-10-CM | POA: Insufficient documentation

## 2013-08-02 DIAGNOSIS — R002 Palpitations: Secondary | ICD-10-CM | POA: Insufficient documentation

## 2013-08-02 DIAGNOSIS — I471 Supraventricular tachycardia: Secondary | ICD-10-CM

## 2013-08-02 DIAGNOSIS — Z885 Allergy status to narcotic agent status: Secondary | ICD-10-CM | POA: Insufficient documentation

## 2013-08-02 DIAGNOSIS — R0989 Other specified symptoms and signs involving the circulatory and respiratory systems: Secondary | ICD-10-CM

## 2013-08-02 DIAGNOSIS — Z7982 Long term (current) use of aspirin: Secondary | ICD-10-CM | POA: Insufficient documentation

## 2013-08-02 LAB — BASIC METABOLIC PANEL
BUN: 12 mg/dL (ref 6–23)
CO2: 24 mEq/L (ref 19–32)
Calcium: 9.4 mg/dL (ref 8.4–10.5)
Chloride: 103 mEq/L (ref 96–112)
Creatinine, Ser: 0.76 mg/dL (ref 0.50–1.10)
GFR calc Af Amer: >90 mL/min (ref >90)
GFR calc non Af Amer: 86 mL/min — ABNORMAL LOW (ref >90)
Glucose, Bld: 134 mg/dL — ABNORMAL HIGH (ref 70–99)
Potassium: 3.8 mEq/L (ref 3.7–5.3)
Sodium: 141 mEq/L (ref 137–147)

## 2013-08-02 LAB — TROPONIN I: Troponin I: <0.3 ng/mL (ref <0.30)

## 2013-08-02 LAB — CBC WITH DIFFERENTIAL/PLATELET
Basophils Absolute: 0.1 10*3/uL (ref 0.0–0.1)
Basophils Relative: 1 % (ref 0–1)
Eosinophils Absolute: 0.1 10*3/uL (ref 0.0–0.7)
Eosinophils Relative: 1 % (ref 0–5)
HCT: 37.4 % (ref 36.0–46.0)
Hemoglobin: 12.3 g/dL (ref 12.0–15.0)
Lymphocytes Relative: 24 % (ref 12–46)
Lymphs Abs: 1.9 10*3/uL (ref 0.7–4.0)
MCH: 26 pg (ref 26.0–34.0)
MCHC: 32.9 g/dL (ref 30.0–36.0)
MCV: 79.1 fL (ref 78.0–100.0)
Monocytes Absolute: 0.7 10*3/uL (ref 0.1–1.0)
Monocytes Relative: 9 % (ref 3–12)
Neutro Abs: 5.4 10*3/uL (ref 1.7–7.7)
Neutrophils Relative %: 65 % (ref 43–77)
Platelets: 291 10*3/uL (ref 150–400)
RBC: 4.73 MIL/uL (ref 3.87–5.11)
RDW: 16.8 % — ABNORMAL HIGH (ref 11.5–15.5)
WBC: 8.1 10*3/uL (ref 4.0–10.5)

## 2013-08-02 LAB — MAGNESIUM: Magnesium: 1.9 mg/dL (ref 1.5–2.5)

## 2013-08-02 LAB — PRO B NATRIURETIC PEPTIDE: Pro B Natriuretic peptide (BNP): 601.2 pg/mL — ABNORMAL HIGH (ref 0–125)

## 2013-08-02 LAB — D-DIMER, QUANTITATIVE: D-Dimer, Quant: 1.25 ug/mL-FEU — ABNORMAL HIGH (ref 0.00–0.48)

## 2013-08-02 IMAGING — CT CT ANGIO CHEST
2 of 9 series · 18 of 46 positions shown · IV contrast (APPLIED)
Comparison: DG CHEST 1V PORT dated [DATE]

CLINICAL DATA: Dyspnea and generalized weakness and fatigue with
one-week history of mid back pain

EXAM:
CT ANGIOGRAPHY CHEST WITH CONTRAST
TECHNIQUE: Multidetector CT imaging of the chest was performed using the
standard protocol during bolus administration of intravenous
contrast. Multiplanar CT image reconstructions and MIPs were
obtained to evaluate the vascular anatomy.
CONTRAST:  120mL OMNIPAQUE IOHEXOL 350 MG/ML SOLN ; a repeat scan
was necessitated by unexpected scatter shut down ; the study is
limited due to respiratory motion artifact.

[Series 8: thins · axial · 0.66mm/px · z∈[-284,-44]mm · 15 of 270 slices shown]
[im 15/270  lung]
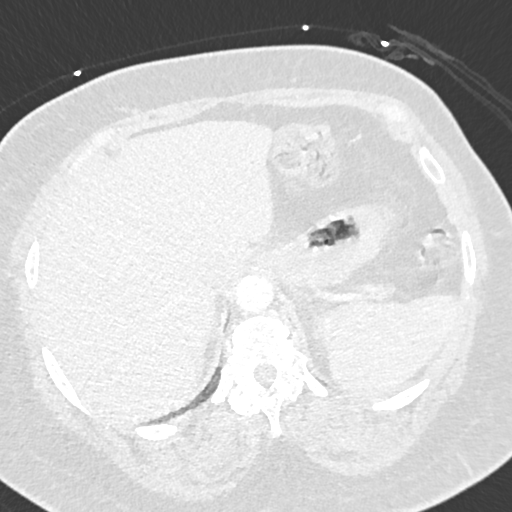
[im 30/270  soft-tissue]
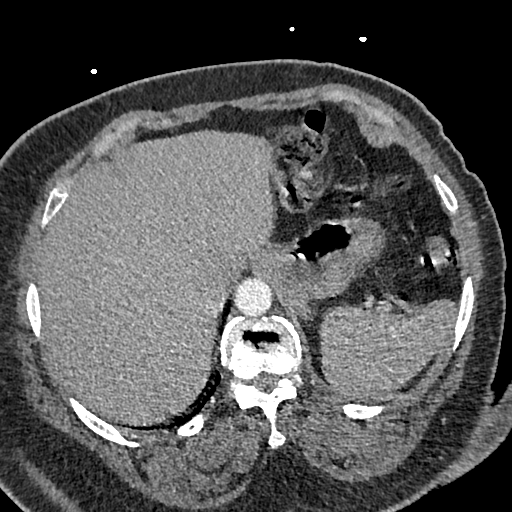
[im 45/270  lung]
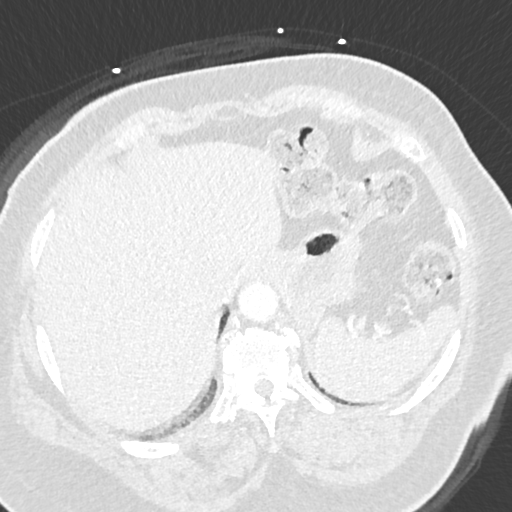
[im 60/270  soft-tissue]
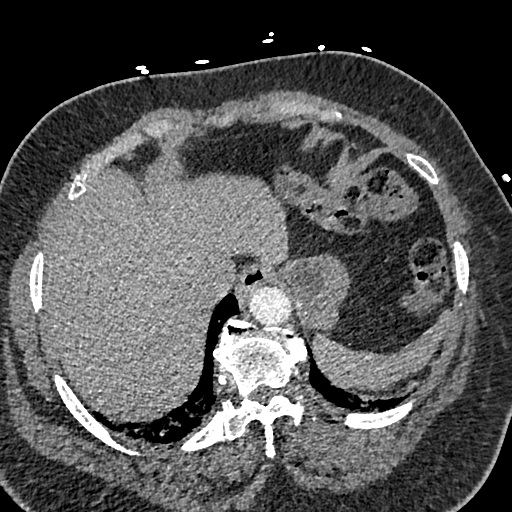
[im 90/270  lung]
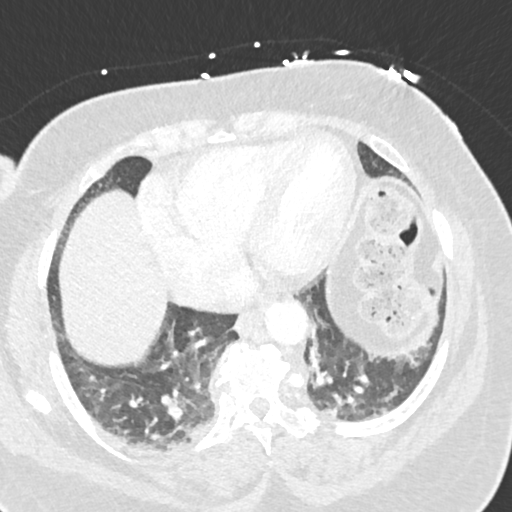
[im 105/270  soft-tissue]
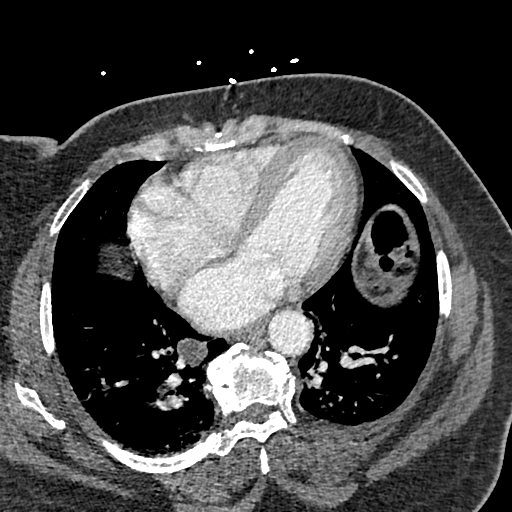
[im 120/270  lung]
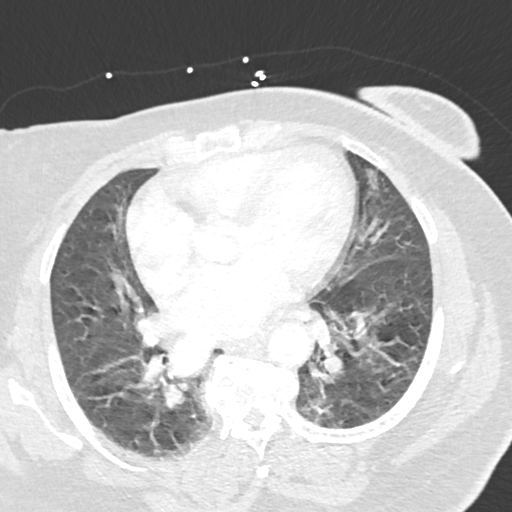
[im 135/270  soft-tissue]
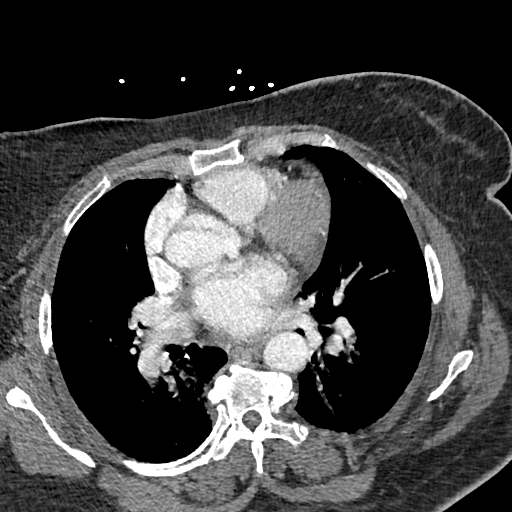
[im 150/270  lung]
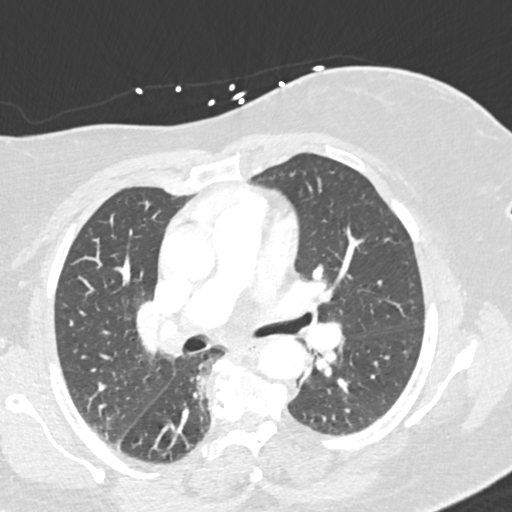
[im 165/270  soft-tissue]
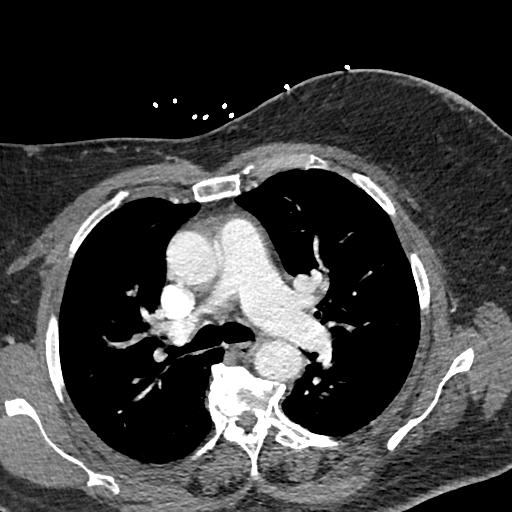
[im 180/270  lung]
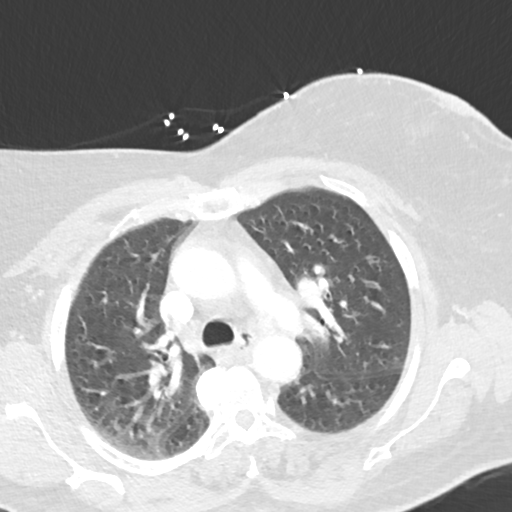
[im 210/270  soft-tissue]
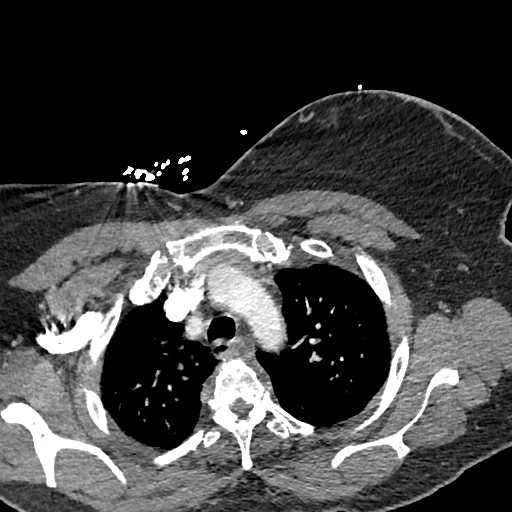
[im 225/270  lung]
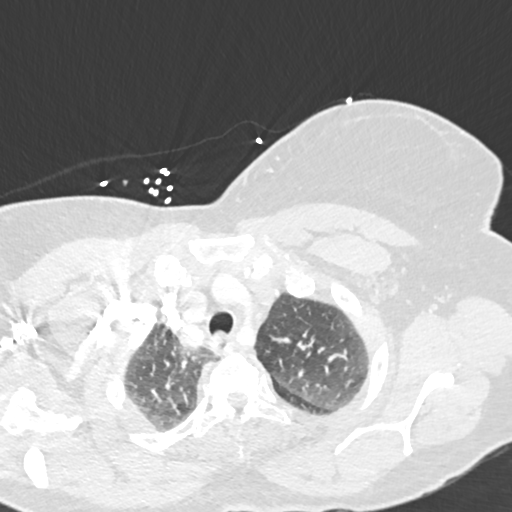
[im 240/270  soft-tissue]
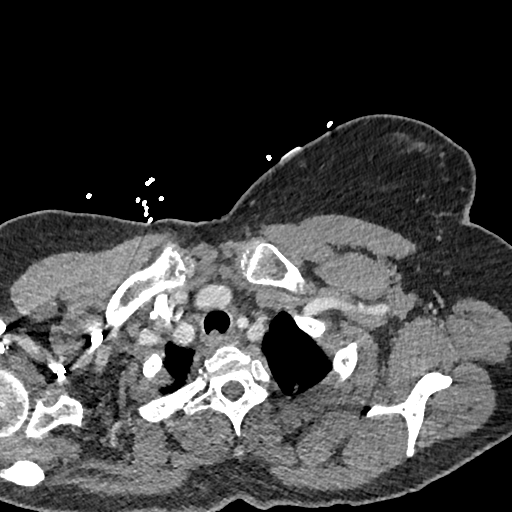
[im 255/270  lung]
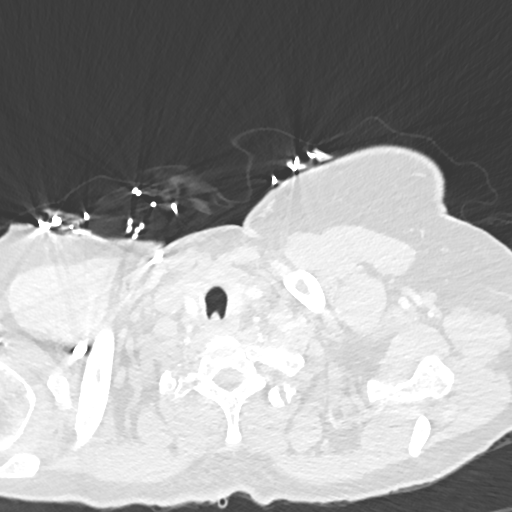

[Series 10: coronal mpr · coronal · 0.59mm/px · 3 of 130 slices shown]
[im 33/130  soft-tissue]
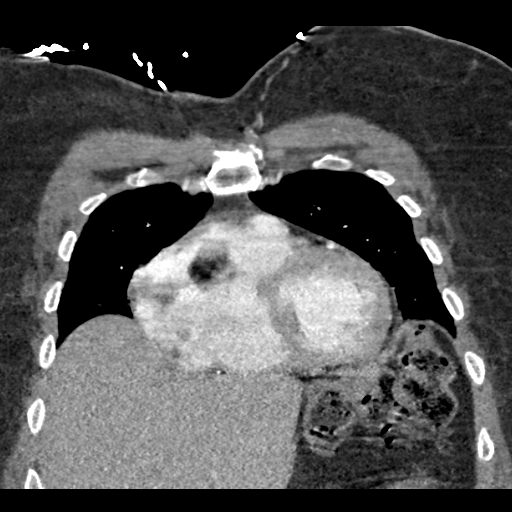
[im 65/130  soft-tissue]
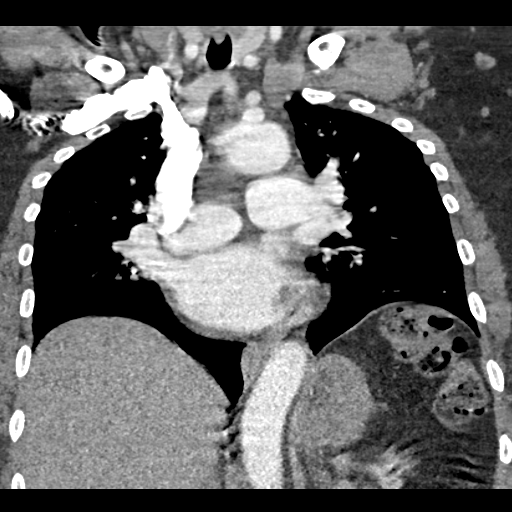
[im 97/130  soft-tissue]
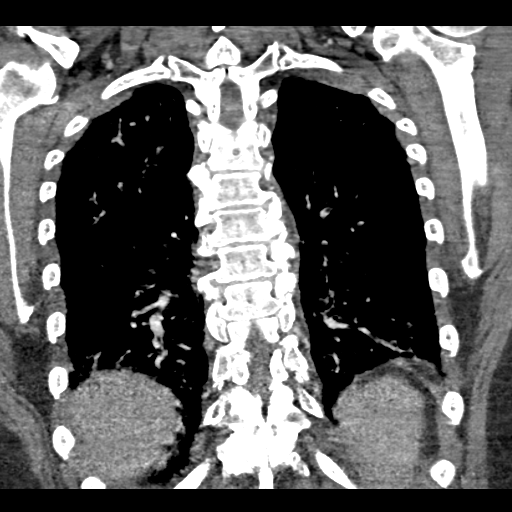

[18 of 46 positions shown; findings below may reference images not displayed]

FINDINGS: The caliber of the thoracic aorta is normal. There is no evidence of
a false lumen. Contrast within the pulmonary arterial tree is
grossly normal. There is respiratory motion artifact which limits
the study. The cardiac chambers are mildly enlarged. There is no
pericardial effusion. The retrosternal soft tissues are normal.
There is no bulky mediastinal lymphadenopathy. There is an
infrahilar lymph node on the right which measures 1.7 x 1.8 cm and
is demonstrated best on image 56 of series 7. The caliber of the
thoracic esophagus is normal. There is a small hiatal hernia.

There is subsegmental atelectasis at both lung bases. There are no
classic alveolar infiltrates. No pulmonary parenchymal masses are
demonstrated.

There are degenerative disc changes at multiple thoracic levels with
prominent anterior endplate osteophytes. There is no compression
fracture. The sternum and observed portions of the ribs exhibit no
acute abnormalities.

Within the upper abdomen the observed portions of the liver and
spleen appear normal.

Review of the MIP images confirms the above findings.
IMPRESSION: 1. There is no evidence of thoracic aortic dissection or of a
thoracic aneurysm. There is no periaortic hematoma.
2. There is no definite evidence of an acute pulmonary embolism.
3. There is enlargement of the cardiac chambers without overt
evidence of pulmonary edema. There is no pleural nor pericardial
effusion.
4. There is a mildly enlarged right infrahilar lymph node measuring
1.7 cm in short axis. There is bibasilar atelectasis. There is no
definite pneumonia.

## 2013-08-02 IMAGING — CR DG CHEST 1V PORT
1 series · 1 of 1 positions shown · non-contrast
Comparison: [DATE]

CLINICAL DATA: Tachycardia, upper back pain, shortness of breath.

EXAM:
PORTABLE CHEST - 1 VIEW

[AP]
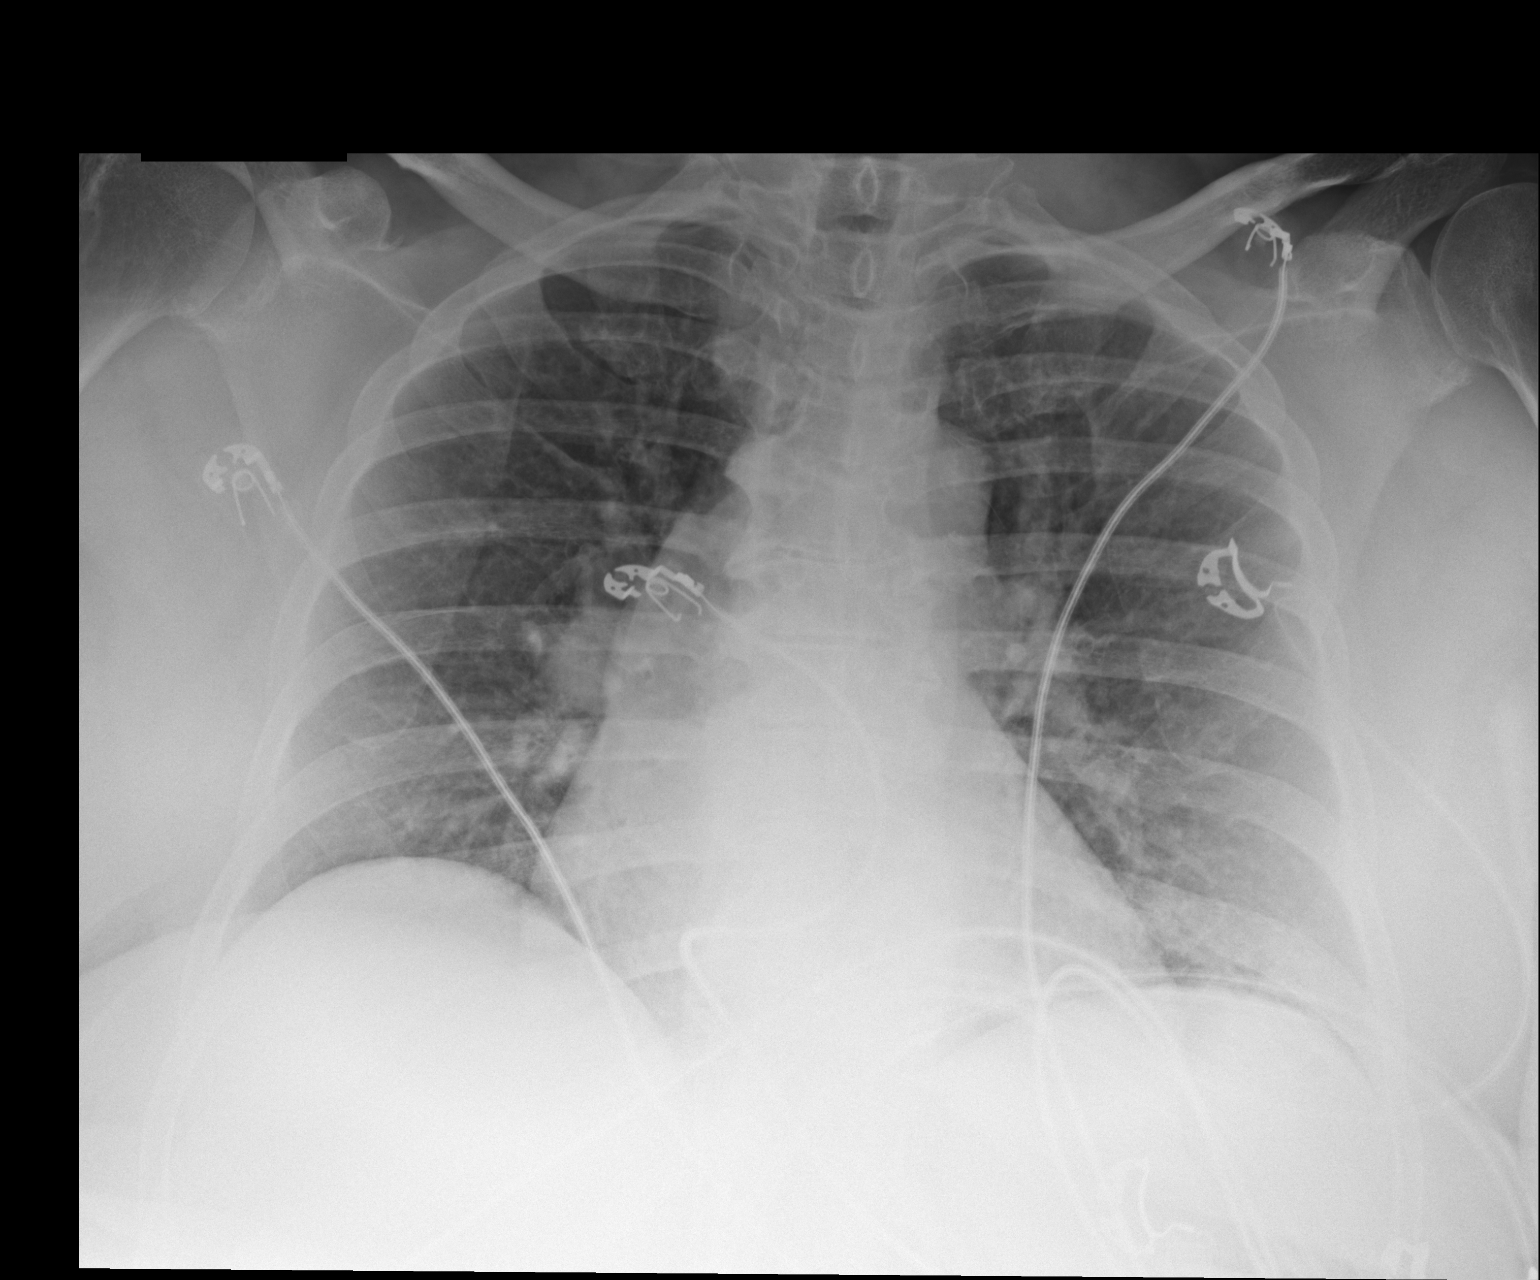

[1 of 1 positions shown; findings below may reference images not displayed]

FINDINGS: Lungs are clear.  No pleural effusion or pneumothorax.

Cardiomegaly.
IMPRESSION: No evidence of acute cardiopulmonary disease.

Cardiomegaly.

## 2013-08-02 MED ORDER — ADENOSINE 6 MG/2ML IV SOLN
INTRAVENOUS | Status: AC
  Filled 2013-08-02: qty 2

## 2013-08-02 MED ORDER — ADENOSINE 6 MG/2ML IV SOLN
6.0000 mg | Freq: Once | INTRAVENOUS | Status: AC
  Administered 2013-08-02: 6 mg via INTRAVENOUS

## 2013-08-02 MED ORDER — ASPIRIN 81 MG PO CHEW
CHEWABLE_TABLET | ORAL | Status: AC
  Filled 2013-08-02: qty 4

## 2013-08-02 MED ORDER — NITROGLYCERIN 0.4 MG SL SUBL
SUBLINGUAL_TABLET | SUBLINGUAL | Status: AC
  Filled 2013-08-02: qty 1

## 2013-08-02 MED ORDER — ADENOSINE 6 MG/2ML IV SOLN
INTRAVENOUS | Status: AC
  Administered 2013-08-02: 6 mg
  Filled 2013-08-02: qty 4

## 2013-08-02 MED ORDER — HYDROCODONE-ACETAMINOPHEN 5-325 MG PO TABS: 2.0000 | ORAL_TABLET | ORAL | Status: DC | PRN

## 2013-08-02 MED ORDER — IOHEXOL 350 MG/ML SOLN
80.0000 mL | Freq: Once | INTRAVENOUS | Status: AC | PRN
  Administered 2013-08-02: 120 mL via INTRAVENOUS

## 2013-08-02 MED ORDER — METOPROLOL TARTRATE 1 MG/ML IV SOLN
5.0000 mg | Freq: Once | INTRAVENOUS | Status: AC
  Administered 2013-08-02: 5 mg via INTRAVENOUS

## 2013-08-02 MED ORDER — METHOCARBAMOL 500 MG PO TABS: 500.0000 mg | ORAL_TABLET | Freq: Two times a day (BID) | ORAL | Status: DC

## 2013-08-02 MED ORDER — METOPROLOL TARTRATE 1 MG/ML IV SOLN
INTRAVENOUS | Status: AC
  Filled 2013-08-02: qty 5

## 2013-08-02 MED ORDER — SODIUM CHLORIDE 0.9 % IV SOLN
Freq: Once | INTRAVENOUS | Status: AC
  Administered 2013-08-02: 16:00:00 via INTRAVENOUS

## 2013-08-02 NOTE — ED Provider Notes (Signed)
Medical screening examination/treatment/procedure(s) were performed by resident physician or non-physician practitioner and as supervising physician I was immediately available for consultation/collaboration.   Pauline Good MD.   Billy Fischer, MD 08/02/13 [phone]

## 2013-08-02 NOTE — ED Notes (Signed)
Pt from [facility] with SVT.  Rate captured at 134.  Pt given 6 of adenosine and converted.  Pt's rate 90 with rare ectopies per EMS.  Pt c/o shortness of breath on arrival.

## 2013-08-02 NOTE — ED Notes (Signed)
Pt transported to CT ?

## 2013-08-02 NOTE — ED Notes (Signed)
Report to EMS.

## 2013-08-02 NOTE — ED Notes (Signed)
C/o SOB x 1 week, not getting any better. States she had problem when climbing stairs when she had pain in chest w breathing. Speaking in short sentences, skin w/d/color good

## 2013-08-02 NOTE — ED Notes (Signed)
Pt's heartrate back in the 130's.  Pt given 6 mg of Adenosine with Dr. Jeneen Rinks at bedside.  HR back in the 90's.  5mg  of Lopressor to be given.

## 2013-08-02 NOTE — Discharge Instructions (Signed)
Return to the emergency room if you develop palpitations again  Cardiac Ablation Cardiac ablation is a procedure to disable a small amount of heart tissue in very specific places. The heart has many electrical connections. Sometimes these connections are abnormal and can cause the heart to beat very fast or irregularly. By disabling some of the problem areas, heart rhythm can be improved or made normal. Ablation is done for people who:   Have Wolff-Parkinson-White syndrome.   Have other fast heart rhythms (tachycardia).   Have taken medicines for an abnormal heart rhythm (arrhythmia) that resulted in:   No success.   Side effects.   May have a high-risk heartbeat that could result in death.  LET [facility] CARE PROVIDER KNOW ABOUT:   Any allergies you have or any previous reactions you have had to X-ray dye, food (such as seafood), medicine, or tape.   All medicines you are taking, including vitamins, herbs, eye drops, creams, and over-the-counter medicines.   Previous problems you or members of your family have had with the use of anesthetics.   Any blood disorders you have.   Previous surgeries or procedures (such as a kidney transplant) you have had.   Medical conditions you have (such as kidney failure).  RISKS AND COMPLICATIONS Generally, cardiac ablation is a safe procedure. However, as with any procedure, complications can occur. Possible risks and complications include:   Increased risk of cancer. Depending on how long it takes to do the ablation, the dose of radiation can be high.  Bruising and bleeding where a thin, flexible tube (catheter) was inserted during the procedure.   Bleeding into the chest, especially into the sac that surrounds the heart (serious).  Need for a permanent pacemaker if the normal electrical system is damaged.   The procedure may not be fully effective, and this may not be recognized for months. Repeat ablation procedures are  sometimes required. BEFORE THE PROCEDURE   Follow any instructions from your health care provider regarding eating and drinking before the procedure.   Take your medicines as directed at regular times with water, unless instructed otherwise by your health care provider. If you are taking diabetes medicine, including insulin, ask how you are to take it and if there are any special instructions you should follow. It is common to adjust insulin dosing the day of the ablation.  PROCEDURE  An ablation is usually performed in a catheterization laboratory with the guidance of fluoroscopy. Fluoroscopy is a type of X-ray that helps your health care provider see images of your heart during the procedure.   An ablation is a minimally invasive procedure. This means a small cut (incision) is made in either your neck or groin. Your health care provider will decide where to make the incision based on your medical history and physical exam.  An IV tube will be started before the procedure begins. You will be given an anesthetic or medicine to help you relax (sedative).  The skin on your neck or groin will be numbed. A needle will be inserted into a large vein in your neck or groin and catheters will be threaded to your heart.  A special dye that shows up on fluoroscopy pictures may be injected through the catheter. The dye helps your health care provider see the area of the heart that needs treatment.  The catheter has electrodes on the tip. When the area of heart tissue that is causing the arrhythmia is found, the catheter tip will send  an electrical current to the area and "scar" the tissue. Three types of energy can be used to ablate the heart tissue:   Heat (radiofrequency energy).   Laser energy.   Extreme cold (cryoablation).   When the area of the heart has been ablated, the catheter will be taken out. Pressure will be held on the insertion site. This will help the insertion site clot and  keep it from bleeding. A bandage will be placed on the insertion site.   An ablation procedure can take 1 6 hours to complete. AFTER THE PROCEDURE   After the procedure, you will be taken to a recovery area where your vital signs (blood pressure, heart rate, and breathing) will be monitored. The insertion site will also be monitored for bleeding.   You will need to lie still for 4 6 hours. This is to ensure you do not bleed from the catheter insertion site.   If your blood pressure and heart rate are stable and no bleeding occurs at the insertion site, you may go home the same day as your procedure.   If complications occur or your health care provider feels you should be watched, you may need to stay in the hospital overnight.  Document Released: 08/09/2008 Document Revised: 11/23/2012 Document Reviewed: 08/15/2012 [facility] Patient Information 2014 [place], [place].  Supraventricular Tachycardia Supraventricular tachycardia (SVT) is an abnormal heart rhythm (arrhythmia) that causes the heart to beat very fast (tachycardia). This kind of fast heartbeat originates in the upper chambers of the heart (atria). SVT can cause the heart to beat greater than 100 beats per minute. SVT can have a rapid burst of heartbeats. This can start and stop suddenly without warning and is called nonsustained. SVT can also be sustained, in which the heart beats at a continuous fast rate.  CAUSES  There can be different causes of SVT. Some of these include:  Heart valve problems such as mitral valve prolapse.  An enlarged heart (hypertrophic cardiomyopathy).  Congenital heart problems.  Heart inflammation (pericarditis).  Hyperthyroidism.  Low potassium or magnesium levels.  Caffeine.  Drug use such as cocaine, methamphetamines, or stimulants.  Some over-the-counter medicines such as:  Decongestants.  Diet medicines.  Herbal medicines. SYMPTOMS  Symptoms of SVT can vary. Symptoms depend on  whether the SVT is sustained or nonsustained. You may experience:  No symptoms (asymptomatic).  An awareness of your heart beating rapidly (palpitations).  Shortness of breath.  Chest pain or pressure. If your blood pressure drops because of the SVT, you may experience:  Fainting or near fainting.  Weakness.  Dizziness. DIAGNOSIS  Different tests can be performed to diagnose SVT, such as:  An electrocardiogram (EKG). This is a painless test that records the electrical activity of your heart.  Holter monitor. This is a 24 hour recording of your heart rhythm. You will be given a diary. Write down all symptoms that you have and what you were doing at the time you experienced symptoms.  Arrhythmia monitor. This is a small device that your wear for several weeks. It records the heart rhythm when you have symptoms.  Echocardiogram. This is an imaging test to help detect abnormal heart structure such as congenital abnormalities, heart valve problems, or heart enlargement.  Stress test. This test can help determine if the SVT is related to exercise.  Electrophysiology study (EPS). This is a procedure that evaluates your heart's electrical system and can help your caregiver find the cause of your SVT. TREATMENT  Treatment of  SVT depends on the symptoms, how often it recurs, and whether there are any underlying heart problems.   If symptoms are rare and no other cardiac disease is present, no treatment may be needed.  Blood work may be done to check potassium, magnesium, and thyroid hormone levels to see if they are abnormal. If these levels are abnormal, treatment to correct the problems will occur. Medicines Your caregiver may use oral medicines to treat SVT. These medicines are given for long-term control of SVT. Medicines may be used alone or in combination with other treatments. These medicines work to slow nerve impulses in the heart muscle. These medicines can also be used to treat  high blood pressure. Some of these medicines may include:  Calcium channel blockers.  Beta blockers.  Digoxin. Nonsurgical procedures Nonsurgical techniques may be used if oral medicines do not work. Some examples include:  Cardioversion. This technique uses either drugs or an electrical shock to restore a normal heart rhythm.  Cardioversion drugs may be given through an intravenous (IV) line to help "reset" the heart rhythm.  In electrical cardioversion, the caregiver shocks your heart to stop its beat for a split second. This helps to reset the heart to a normal rhythm.  Ablation. This procedure is done under mild sedation. High frequency radio wave energy is used to destroy the area of heart tissue responsible for the SVT. HOME CARE INSTRUCTIONS   Do not smoke.  Only take medicines prescribed by your caregiver. Check with your caregiver before using over-the-counter medicines.  Check with your caregiver about how much alcohol and caffeine (coffee, tea, colas, or chocolate) you may have.  It is very important to keep all follow-up referrals and appointments in order to properly manage this problem. SEEK IMMEDIATE MEDICAL CARE IF:  You have dizziness.  You faint or nearly faint.  You have shortness of breath.  You have chest pain or pressure.  You have sudden nausea or vomiting.  You have profuse sweating.  You are concerned about how long your symptoms last.  You are concerned about the frequency of your SVT episodes. If you have the above symptoms, call your local emergency services (911 in [place] immediately. Do not drive yourself to the hospital. MAKE SURE YOU:   Understand these instructions.  Will watch your condition.  Will get help right away if you are not doing well or get worse. Document Released: 03/23/2005 Document Revised: 06/15/2011 Document Reviewed: 07/05/2008 [facility] Patient Information 2014 [place], [place].

## 2013-08-02 NOTE — ED Provider Notes (Signed)
CSN: [id number]     Arrival date & time 08/02/13  1549 History   None    Chief Complaint  Patient presents with  . Shortness of Breath   (Consider location/radiation/quality/duration/timing/severity/associated sxs/prior Treatment) HPI Comments: Patient presents with one week history of mid back pain and 24 hours of dyspnea, generalized weakness, and fatigue that is exacerbated by exertion.  Reports hx of "irregular heartbeat" that has been treated by Dr. Peter Martinique with Metoprolol.  Denies known hx of CAD.   The history is provided by the patient.    Past Medical History  Diagnosis Date  . Edema   . Palpitations   . History of diastolic dysfunction     Echo 11/1827 with diastolic dysfunction  . Arthritis   . OA (osteoarthritis)   . PAC (premature atrial contraction)     per prior Holter  . PVC's (premature ventricular contractions)     per prior Holter  . Morbid obesity   . HTN (hypertension)   . Tachycardia     noted at 07/28/11 visit. Started on beta blocker. Possible atrial flutter vs long PR tachycardia/AVNRT   Past Surgical History  Procedure Laterality Date  . Cesarean section    . US echocardiography  07/25/2008    EF 55-60%   Family History  Problem Relation Age of Onset  . Alzheimer's disease Mother   . Lung cancer Mother   . COPD Father   . Diabetes Maternal Uncle   . Stroke Neg Hx    History  Substance Use Topics  . Smoking status: Former Smoker    Quit date: 11/23/1985  . Smokeless tobacco: Not on file  . Alcohol Use: No   OB History   Grav Para Term Preterm Abortions TAB SAB Ect Mult Living                 Review of Systems  Constitutional: Positive for fatigue.  HENT: Negative.   Eyes: Negative.   Respiratory: Positive for chest tightness and shortness of breath.   Cardiovascular: Positive for chest pain.  Gastrointestinal: Negative.   Genitourinary: Negative.   Musculoskeletal: Positive for back pain.  Skin: Negative.   Neurological:  Positive for weakness. Negative for dizziness and syncope.    Allergies  Oxycodone hcl and Penicillins  Home Medications   Prior to Admission medications   Medication Sig Start Date End Date Taking? Authorizing Provider  aspirin 81 MG tablet Take 81 mg by mouth daily.    Historical Provider, MD  furosemide (LASIX) 40 MG tablet Take 1 tablet (40 mg total) by mouth as needed. 09/30/12   Peter M Martinique, MD  losartan (COZAAR) 100 MG tablet Take 1 tablet (100 mg total) by mouth daily. 09/30/12   Peter M Martinique, MD  metFORMIN (GLUCOPHAGE) 500 MG tablet TAKE 1 TABLET BY MOUTH TWICE A DAY WITH A MEAL 04/19/13   Webb Silversmith, NP  metoprolol (LOPRESSOR) 100 MG tablet Take 1 tablet (100 mg total) by mouth 2 (two) times daily. 09/30/12   Peter M Martinique, MD  mirabegron ER (MYRBETRIQ) 25 MG TB24 Take 1 tablet (25 mg total) by mouth daily. 10/19/12   Webb Silversmith, NP  naproxen (NAPROSYN) 250 MG tablet Take 1 tablet (250 mg total) by mouth 2 (two) times daily with a meal. 10/19/12   Webb Silversmith, NP  omeprazole (PRILOSEC) 20 MG capsule Take 1 capsule (20 mg total) by mouth 2 (two) times daily. 09/30/12 09/30/13  Peter M Martinique, MD  predniSONE ([place]) 10  MG tablet Take 3 tablets on days 1-2, take 2 tablets on days 3-4, take 1 tablet on days 5-6 11/04/12   Webb Silversmith, NP  sulfamethoxazole-trimethoprim (BACTRIM DS) 800-160 MG per tablet Take 1 tablet by mouth 2 (two) times daily. 11/04/12   Webb Silversmith, NP  traMADol (ULTRAM) 50 MG tablet Take 1 tablet (50 mg total) by mouth every 12 (twelve) hours as needed for pain. 01/23/13   Webb Silversmith, NP   BP 154/100  Pulse 145  Temp(Src) 98.9 F (37.2 C) (Oral)  Resp 25  SpO2 100% Physical Exam  Nursing note and vitals reviewed. Constitutional: She appears well-developed and well-nourished. No distress.  HENT:  Head: Normocephalic and atraumatic.  Eyes: Conjunctivae are normal. No scleral icterus.  Neck: No JVD present.  Cardiovascular: An irregular rhythm  present. Tachycardia present.   Pulmonary/Chest: Breath sounds normal. Tachypnea noted. She has no wheezes.  Abdominal: Soft. Bowel sounds are normal. She exhibits no distension. There is no tenderness.  Musculoskeletal: Normal range of motion.  Skin: Skin is warm and dry. No rash noted. No erythema.  Psychiatric: Her mood appears anxious.    ED Course  Procedures (including critical care time) Labs Review Labs Reviewed - No data to display  Imaging Review No results found.   MDM   1. SVT (supraventricular tachycardia)   2. Dyspnea   3. Back pain    ECG: SVT @ rate of 134. IV (20g) placed at right [facility] fossa and patient placed on cardiac monitor. Given 6 mg of adenosine rapid IV push with conversion to NSR at rate of 86 bpm with occasional PVCs. Placed on O2 via nasal canula. Dyspnea improved but back pain persists. Given 324 mg aspirin to chew and swallow and transferred to [facility] for continued evaluation and treatment. Case discussed with Dr. Canary Brim at [facility].     [place], [place] 08/02/13 [phone]

## 2013-08-03 ENCOUNTER — Telehealth: Payer: Self-pay | Admitting: Cardiology

## 2013-08-03 NOTE — ED Provider Notes (Signed)
CSN: [id number]     Arrival date & time 08/02/13  1646 History   First MD Initiated Contact with Patient 08/02/13 1647     Chief Complaint  Patient presents with  . Tachycardia      HPI  Patient presented here after evaluation at urgent care that revealed supraventricular tachycardia. Was given adenosine at urgent care and had conversion. Transferred here for further evaluation. Patient takes metoprolol for a history of palpitations. She's not recall she's never been formally told that this was SVT. She states she's had intermittent episodes of shortness of breath today. States she's had slight shortness of breath for a week. Her sensation of palpitations started approximately 2 hours before presentation urgent care. He doesn't pain in the left side of her back adjacent her scapula for 3-4 days. She states it hurts even getting out of bed or moving around. Does not have exertional dyspnea or exertional anginal type pain.   Past Medical History  Diagnosis Date  . Edema   . Palpitations   . History of diastolic dysfunction     Echo 12/2424 with diastolic dysfunction  . Arthritis   . OA (osteoarthritis)   . PAC (premature atrial contraction)     per prior Holter  . PVC's (premature ventricular contractions)     per prior Holter  . Morbid obesity   . HTN (hypertension)   . Tachycardia     noted at 07/28/11 visit. Started on beta blocker. Possible atrial flutter vs long PR tachycardia/AVNRT   Past Surgical History  Procedure Laterality Date  . Cesarean section    . US echocardiography  07/25/2008    EF 55-60%   Family History  Problem Relation Age of Onset  . Alzheimer's disease Mother   . Lung cancer Mother   . COPD Father   . Diabetes Maternal Uncle   . Stroke Neg Hx    History  Substance Use Topics  . Smoking status: Former Smoker    Quit date: 11/23/1985  . Smokeless tobacco: Not on file  . Alcohol Use: No   OB History   Grav Para Term Preterm Abortions TAB SAB Ect  Mult Living                 Review of Systems  Constitutional: Negative for fever, chills, diaphoresis, appetite change and fatigue.  HENT: Negative for mouth sores, sore throat and trouble swallowing.   Eyes: Negative for visual disturbance.  Respiratory: Positive for shortness of breath. Negative for cough, chest tightness and wheezing.   Cardiovascular: Positive for palpitations. Negative for chest pain.  Gastrointestinal: Negative for nausea, vomiting, abdominal pain, diarrhea and abdominal distention.  Endocrine: Negative for polydipsia, polyphagia and polyuria.  Genitourinary: Negative for dysuria, frequency and hematuria.  Musculoskeletal: Positive for back pain. Negative for gait problem.  Skin: Negative for color change, pallor and rash.  Neurological: Negative for dizziness, syncope, light-headedness and headaches.  Hematological: Does not bruise/bleed easily.  Psychiatric/Behavioral: Negative for behavioral problems and confusion.      Allergies  Oxycodone hcl and Penicillins  Home Medications   Prior to Admission medications   Medication Sig Start Date End Date Taking? Authorizing Provider  acetaminophen (TYLENOL) 325 MG tablet Take 650 mg by mouth every 6 (six) hours as needed for moderate pain.   Yes Historical Provider, MD  aspirin EC 81 MG tablet Take 81 mg by mouth as needed (when remembered for heart per Dr Neita Garnet).   Yes Historical Provider, MD  furosemide (LASIX)  40 MG tablet Take 40 mg by mouth daily as needed for edema.   Yes Historical Provider, MD  losartan (COZAAR) 100 MG tablet Take 1 tablet (100 mg total) by mouth daily. 09/30/12  Yes Peter M Martinique, MD  metFORMIN (GLUCOPHAGE) 500 MG tablet Take 500 mg by mouth 2 (two) times daily with a meal.   Yes Historical Provider, MD  metoprolol (LOPRESSOR) 100 MG tablet Take 1 tablet (100 mg total) by mouth 2 (two) times daily. 09/30/12  Yes Peter M Martinique, MD  omeprazole (PRILOSEC) 20 MG capsule Take 1 capsule (20  mg total) by mouth 2 (two) times daily. 09/30/12 09/30/13 Yes Peter M Martinique, MD  HYDROcodone-acetaminophen (NORCO/VICODIN) 5-325 MG per tablet Take 2 tablets by mouth every 4 (four) hours as needed. 08/02/13   Tanna Furry, MD  methocarbamol (ROBAXIN) 500 MG tablet Take 1 tablet (500 mg total) by mouth 2 (two) times daily. 08/02/13   Tanna Furry, MD   BP 168/89  Pulse 70  Temp(Src) 98.5 F (36.9 C) (Oral)  Resp 20  SpO2 97% Physical Exam  Constitutional: She is oriented to person, place, and time. She appears well-developed and well-nourished. No distress.  Obese black female. Not dyspneic.  HENT:  Head: Normocephalic.  Eyes: Conjunctivae are normal. Pupils are equal, round, and reactive to light. No scleral icterus.  Neck: Normal range of motion. Neck supple. No thyromegaly present.  No JVD  Cardiovascular: Normal rate and regular rhythm.  Exam reveals no gallop and no friction rub.   No murmur heard. Sinus rhythm on the monitor  Pulmonary/Chest: Effort normal and breath sounds normal. No respiratory distress. She has no wheezes. She has no rales.  Lungs  clear to auscultation  Tender to palpation adjacent to scapula posteriorly. No rash or vesicles noted.  Abdominal: Soft. Bowel sounds are normal. She exhibits no distension. There is no tenderness. There is no rebound.  Musculoskeletal: Normal range of motion.  Neurological: She is alert and oriented to person, place, and time.  Skin: Skin is warm and dry. No rash noted.  Symmetric bilateral fluctuate 2+ edema. She wears compression stockings for this. States that her edema is less than typical for her.  Psychiatric: She has a normal mood and affect. Her behavior is normal.    ED Course  CARDIOVERSION Date/Time: 08/03/2013 6:00 PM Performed by: Tanna Furry Authorized by: Tanna Furry Consent: Verbal consent obtained. The procedure was performed in an emergent situation. Consent given by: patient Patient understanding: patient states  understanding of the procedure being performed Patient identity confirmed: verbally with patient Time out: Immediately prior to procedure a "time out" was called to verify the correct patient, procedure, equipment, support staff and site/side marked as required. Patient sedated: no Cardioversion basis: emergent Pre-procedure rhythm: supraventricular tachycardia Patient position: patient was placed in a supine position Electrodes: pads Number of attempts: 1 Shock (joules) attempt one: Chemical cardioversion with Adenocard. Post-procedure rhythm: normal sinus rhythm Complications: no complications Patient tolerance: Patient tolerated the procedure well with no immediate complications.   (including critical care time) Labs Review Labs Reviewed  BASIC METABOLIC PANEL - Abnormal; Notable for the following:    Glucose, Bld 134 (*)    GFR calc non Af Amer 86 (*)    All other components within normal limits  CBC WITH DIFFERENTIAL - Abnormal; Notable for the following:    RDW 16.8 (*)    All other components within normal limits  PRO B NATRIURETIC PEPTIDE - Abnormal; Notable for the  following:    Pro B Natriuretic peptide (BNP) 601.2 (*)    All other components within normal limits  D-DIMER, QUANTITATIVE - Abnormal; Notable for the following:    D-Dimer, Quant 1.25 (*)    All other components within normal limits  TROPONIN I  MAGNESIUM    Imaging Review Ct Angio Chest Pe W/cm &/or Wo Cm  08/02/2013   CLINICAL DATA:  Dyspnea and generalized weakness and fatigue with one-week history of mid back pain  EXAM: CT ANGIOGRAPHY CHEST WITH CONTRAST  TECHNIQUE: Multidetector CT imaging of the chest was performed using the standard protocol during bolus administration of intravenous contrast. Multiplanar CT image reconstructions and MIPs were obtained to evaluate the vascular anatomy.  CONTRAST:  165mL OMNIPAQUE IOHEXOL 350 MG/ML SOLN ; a repeat scan was necessitated by unexpected scatter shut down ;  the study is limited due to respiratory motion artifact.  COMPARISON:  DG CHEST 1V PORT dated 08/02/2013  FINDINGS: The caliber of the thoracic aorta is normal. There is no evidence of a false lumen. Contrast within the pulmonary arterial tree is grossly normal. There is respiratory motion artifact which limits the study. The cardiac chambers are mildly enlarged. There is no pericardial effusion. The retrosternal soft tissues are normal. There is no bulky mediastinal lymphadenopathy. There is an infrahilar lymph node on the right which measures 1.7 x 1.8 cm and is demonstrated best on image 56 of series 7. The caliber of the thoracic esophagus is normal. There is a small hiatal hernia.  There is subsegmental atelectasis at both lung bases. There are no classic alveolar infiltrates. No pulmonary parenchymal masses are demonstrated.  There are degenerative disc changes at multiple thoracic levels with prominent anterior endplate osteophytes. There is no compression fracture. The sternum and observed portions of the ribs exhibit no acute abnormalities.  Within the upper abdomen the observed portions of the liver and spleen appear normal.  Review of the MIP images confirms the above findings.  IMPRESSION: 1. There is no evidence of thoracic aortic dissection or of a thoracic aneurysm. There is no periaortic hematoma. 2. There is no definite evidence of an acute pulmonary embolism. 3. There is enlargement of the cardiac chambers without overt evidence of pulmonary edema. There is no pleural nor pericardial effusion. 4. There is a mildly enlarged right infrahilar lymph node measuring 1.7 cm in short axis. There is bibasilar atelectasis. There is no definite pneumonia.   Electronically Signed   By: David  Martinique   On: 08/02/2013 20:04   Dg Chest Port 1 View  08/02/2013   CLINICAL DATA:  Tachycardia, upper back pain, shortness of breath.  EXAM: PORTABLE CHEST - 1 VIEW  COMPARISON:  05/24/2009  FINDINGS: Lungs are clear.   No pleural effusion or pneumothorax.  Cardiomegaly.  IMPRESSION: No evidence of acute cardiopulmonary disease.  Cardiomegaly.   Electronically Signed   By: Julian Hy M.D.   On: 08/02/2013 17:23     EKG Interpretation   Date/Time:  Wednesday August 02 2013 17:00:36 EDT Ventricular Rate:  129 PR Interval:  80 QRS Duration: 114 QT Interval:  351 QTC Calculation: 514 R Axis:   -26 Text Interpretation:  SVT Prolonged QT interval Confirmed by Jeneen Rinks  MD,  [place] ([id number]) on 08/03/2013 1:40:56 AM      MDM   Final diagnoses:  Supraventricular tachycardia  Back pain    Patient converted with Adenocard. Remained asymptomatic. D-dimer elevated. CT angiogram normal. He comfortably. She is appropriate for outpatient treatment.  Continue her metoprolol. I did discuss the case with Dr. Claiborne Billings on call for cardiology. He did not feel any additional treatment was necessary for her SVT unless it recurs. Astra followup with her cardiologist. Symptomatic treatment for her chest wall pain.    Tanna Furry, MD 08/03/13 [phone]

## 2013-08-03 NOTE — Telephone Encounter (Signed)
Returned call to patient she stated she had a episode of fast heart beat and sob yesterday.Stated she went to [facility] ER.Stated she was told to make appointment to see Dr.Jordan.Stated she has not had any fast heart beat today.Stated she does take metoprolol tart 100 mg twice a day.Appointment scheduled with Dr.Jordan 08/04/13 at 2:00 pm.

## 2013-08-03 NOTE — Telephone Encounter (Signed)
New problem   Pt need to speak to nurse concerning back pain, rapid heartrate and sob. Pt stated she isn't having sob at present. Please call pt

## 2013-08-04 ENCOUNTER — Encounter: Payer: Self-pay | Admitting: Cardiology

## 2013-08-04 ENCOUNTER — Other Ambulatory Visit: Payer: Self-pay | Admitting: Cardiology

## 2013-08-04 ENCOUNTER — Ambulatory Visit (INDEPENDENT_AMBULATORY_CARE_PROVIDER_SITE_OTHER): Payer: Medicare Other | Admitting: Cardiology

## 2013-08-04 DIAGNOSIS — I872 Venous insufficiency (chronic) (peripheral): Secondary | ICD-10-CM

## 2013-08-04 DIAGNOSIS — I471 Supraventricular tachycardia: Secondary | ICD-10-CM

## 2013-08-04 DIAGNOSIS — I10 Essential (primary) hypertension: Secondary | ICD-10-CM

## 2013-08-04 DIAGNOSIS — I498 Other specified cardiac arrhythmias: Secondary | ICD-10-CM

## 2013-08-04 DIAGNOSIS — I878 Other specified disorders of veins: Secondary | ICD-10-CM

## 2013-08-04 NOTE — Patient Instructions (Signed)
Continue your current therapy  If you have more frequent heart racing then let me know.  I will see you in 6 months.

## 2013-08-04 NOTE — Progress Notes (Signed)
Khamari Blyth Date of Birth: Dec 20, 1945 Medical Record #[id number]  History of Present Illness: Ms. Foody is seen back today for a follow up visit. She has a history of SVT with HR up to 170s on event monitor in 4/13. She is on chronic metoprolol.  She is morbidly obese, has chronic venous insufficiency and edema, HTN and chronic pain. Recently she complained of having a stiff neck. This radiated pain down into her central back. She then developed acute SOB. She went to Urgent care and was found to be in SVT with a rate of 134. She was given IV adenosine with conversion to NSR. In ED she had a chest CT without significant pathology. She thinks her heart racing is infrequent.   Current Outpatient Prescriptions on File Prior to Visit  Medication Sig Dispense Refill  . acetaminophen (TYLENOL) 325 MG tablet Take 650 mg by mouth every 6 (six) hours as needed for moderate pain.      Marland Kitchen aspirin EC 81 MG tablet Take 81 mg by mouth once.       . furosemide (LASIX) 40 MG tablet Take 40 mg by mouth daily as needed for edema.      Marland Kitchen HYDROcodone-acetaminophen (NORCO/VICODIN) 5-325 MG per tablet Take 2 tablets by mouth every 4 (four) hours as needed.  10 tablet  0  . losartan (COZAAR) 100 MG tablet Take 1 tablet (100 mg total) by mouth daily.  90 tablet  3  . metFORMIN (GLUCOPHAGE) 500 MG tablet Take 500 mg by mouth 2 (two) times daily with a meal.      . methocarbamol (ROBAXIN) 500 MG tablet Take 1 tablet (500 mg total) by mouth 2 (two) times daily.  20 tablet  0  . metoprolol (LOPRESSOR) 100 MG tablet Take 1 tablet (100 mg total) by mouth 2 (two) times daily.  180 tablet  3  . omeprazole (PRILOSEC) 20 MG capsule Take 1 capsule (20 mg total) by mouth 2 (two) times daily.  180 capsule  3   No current facility-administered medications on file prior to visit.    Allergies  Allergen Reactions  . Oxycodone Hcl Nausea And Vomiting  . Penicillins Itching    Past Medical History  Diagnosis Date  . Edema    . Palpitations   . History of diastolic dysfunction     Echo 09/4330 with diastolic dysfunction  . Arthritis   . OA (osteoarthritis)   . PAC (premature atrial contraction)     per prior Holter  . PVC's (premature ventricular contractions)     per prior Holter  . Morbid obesity   . HTN (hypertension)   . Tachycardia     noted at 07/28/11 visit. Started on beta blocker. Possible atrial flutter vs long PR tachycardia/AVNRT  . SVT (supraventricular tachycardia)     Past Surgical History  Procedure Laterality Date  . Cesarean section    . US echocardiography  07/25/2008    EF 55-60%    History  Smoking status  . Former Smoker  . Quit date: 11/23/1985  Smokeless tobacco  . Not on file    History  Alcohol Use No    Family History  Problem Relation Age of Onset  . Alzheimer's disease Mother   . Lung cancer Mother   . COPD Father   . Diabetes Maternal Uncle   . Stroke Neg Hx     Review of Systems: The review of systems is per the HPI.  All other systems were reviewed and  are negative.  Physical Exam: BP 160/74  Pulse 69  Ht 5\' 11"  (1.803 m)  Wt 346 lb 1.9 oz (156.999 kg)  BMI 48.30 kg/m2  SpO2 95% Patient is very pleasant and in no acute distress. She is morbidly obese. She has difficulty ambulating.  Skin is warm and dry. Color is normal.  HEENT is unremarkable. Normocephalic/atraumatic. PERRL. Sclera are nonicteric. Neck is supple. No masses. No JVD. Lungs are fairly clear. Cardiac exam shows a regular rate and rhythm.  there is a soft outflow murmur.  Adomen is morbidly obese but soft. Extremities are quite full and with 3+edema which is chronic. Gait and ROM are intact. No gross neurologic deficits noted.  LABORATORY DATA:  Lab Results  Component Value Date   WBC 8.1 08/02/2013   HGB 12.3 08/02/2013   HCT 37.4 08/02/2013   PLT 291 08/02/2013   GLUCOSE 134* 08/02/2013   CHOL 148 05/02/2012   TRIG 51.0 05/02/2012   HDL 47.70 05/02/2012   LDLCALC 90 05/02/2012    ALT 18 05/02/2012   AST 23 05/02/2012   NA 141 08/02/2013   K 3.8 08/02/2013   CL 103 08/02/2013   CREATININE 0.76 08/02/2013   BUN 12 08/02/2013   CO2 24 08/02/2013   TSH 3.93 07/28/2011   HGBA1C 6.5 01/23/2013   Echo Study Conclusions from 2013 - Left ventricle: The cavity size was mildly dilated. Systolic function was normal. The estimated ejection fraction was in the range of 60% to 65%. - Aortic valve: There was very mild stenosis.    Assessment / Plan: 1. HTN -  blood pressure well controlled on current therapy.  2. SVT - maintained on chronic beta blocker therapy. I would recommend continuing her current therapy. If she has more frequent tachycardia she could be considered for ablative therapy.  3. Swelling - chronic -  related to severe venous insufficiency.  4.  Morbid obesity. Patient has deferred sleep study in the past.

## 2013-10-06 ENCOUNTER — Other Ambulatory Visit: Payer: Self-pay | Admitting: Cardiology

## 2013-10-10 ENCOUNTER — Telehealth: Payer: Self-pay | Admitting: *Deleted

## 2013-10-10 DIAGNOSIS — E119 Type 2 diabetes mellitus without complications: Secondary | ICD-10-CM

## 2013-10-10 NOTE — Telephone Encounter (Signed)
Left message on machine for patient to call office and schedule an appointment to follow up with diabetes. A1c, bmet ordered Diabetic bundle

## 2013-10-17 ENCOUNTER — Telehealth: Payer: Self-pay

## 2013-10-17 NOTE — Telephone Encounter (Signed)
She will need to be seen for me to put the referral in

## 2013-10-17 NOTE — Telephone Encounter (Signed)
lmovm for pt to return my call 

## 2013-10-17 NOTE — Telephone Encounter (Signed)
Pt request referral to orthopedic dr in [place] re; pain in both knees;01/2013 visit pt mentioned bilateral knee pain; bilateral knee pain has worsened; pain level now is 10; pt has difficulty in walking. Pt also request refill omeprazole; advised pt Dr Martinique refilled 10/09/13; pt will check with pharmacy.

## 2013-10-18 NOTE — Telephone Encounter (Signed)
Pt states this office is too far and will call [place] office to make appt there

## 2013-10-27 ENCOUNTER — Other Ambulatory Visit: Payer: Self-pay | Admitting: Internal Medicine

## 2013-10-28 ENCOUNTER — Other Ambulatory Visit: Payer: Self-pay | Admitting: Internal Medicine

## 2013-10-30 ENCOUNTER — Telehealth: Payer: Self-pay

## 2013-11-03 MED ORDER — OMEPRAZOLE 20 MG PO CPDR: 20.0000 mg | DELAYED_RELEASE_CAPSULE | Freq: Two times a day (BID) | ORAL | Status: DC

## 2013-11-03 NOTE — Telephone Encounter (Signed)
[employer] called quanity review limit approved for omeprazole  20 mg twice a day.Refill sent to pharmacy.

## 2013-11-17 ENCOUNTER — Ambulatory Visit (INDEPENDENT_AMBULATORY_CARE_PROVIDER_SITE_OTHER): Payer: Medicare Other | Admitting: Internal Medicine

## 2013-11-17 ENCOUNTER — Encounter: Payer: Self-pay | Admitting: Internal Medicine

## 2013-11-17 ENCOUNTER — Emergency Department (HOSPITAL_COMMUNITY): Payer: Medicare Other

## 2013-11-17 ENCOUNTER — Inpatient Hospital Stay (HOSPITAL_COMMUNITY)
Admission: EM | Admit: 2013-11-17 | Discharge: 2013-11-23 | DRG: 872 | Disposition: A | Discharge status: Rehabilitation Facility | Payer: Medicare Other | Attending: Internal Medicine | Admitting: Internal Medicine

## 2013-11-17 ENCOUNTER — Encounter (HOSPITAL_COMMUNITY): Payer: Self-pay | Admitting: Emergency Medicine

## 2013-11-17 VITALS — BP 120/60 | HR 108 | Temp 99.4°F | Ht 71.0 in

## 2013-11-17 DIAGNOSIS — I878 Other specified disorders of veins: Secondary | ICD-10-CM

## 2013-11-17 DIAGNOSIS — Z7982 Long term (current) use of aspirin: Secondary | ICD-10-CM

## 2013-11-17 DIAGNOSIS — D649 Anemia, unspecified: Secondary | ICD-10-CM | POA: Diagnosis present

## 2013-11-17 DIAGNOSIS — E1129 Type 2 diabetes mellitus with other diabetic kidney complication: Secondary | ICD-10-CM

## 2013-11-17 DIAGNOSIS — I350 Nonrheumatic aortic (valve) stenosis: Secondary | ICD-10-CM

## 2013-11-17 DIAGNOSIS — R262 Difficulty in walking, not elsewhere classified: Secondary | ICD-10-CM

## 2013-11-17 DIAGNOSIS — Z885 Allergy status to narcotic agent status: Secondary | ICD-10-CM

## 2013-11-17 DIAGNOSIS — L02419 Cutaneous abscess of limb, unspecified: Secondary | ICD-10-CM | POA: Diagnosis present

## 2013-11-17 DIAGNOSIS — R531 Weakness: Secondary | ICD-10-CM

## 2013-11-17 DIAGNOSIS — I10 Essential (primary) hypertension: Secondary | ICD-10-CM

## 2013-11-17 DIAGNOSIS — N390 Urinary tract infection, site not specified: Secondary | ICD-10-CM

## 2013-11-17 DIAGNOSIS — E119 Type 2 diabetes mellitus without complications: Secondary | ICD-10-CM

## 2013-11-17 DIAGNOSIS — Z820 Family history of epilepsy and other diseases of the nervous system: Secondary | ICD-10-CM

## 2013-11-17 DIAGNOSIS — I359 Nonrheumatic aortic valve disorder, unspecified: Secondary | ICD-10-CM | POA: Diagnosis present

## 2013-11-17 DIAGNOSIS — M549 Dorsalgia, unspecified: Secondary | ICD-10-CM | POA: Insufficient documentation

## 2013-11-17 DIAGNOSIS — R110 Nausea: Secondary | ICD-10-CM | POA: Insufficient documentation

## 2013-11-17 DIAGNOSIS — I509 Heart failure, unspecified: Secondary | ICD-10-CM | POA: Diagnosis present

## 2013-11-17 DIAGNOSIS — R109 Unspecified abdominal pain: Secondary | ICD-10-CM

## 2013-11-17 DIAGNOSIS — Z9181 History of falling: Secondary | ICD-10-CM

## 2013-11-17 DIAGNOSIS — I872 Venous insufficiency (chronic) (peripheral): Secondary | ICD-10-CM | POA: Diagnosis present

## 2013-11-17 DIAGNOSIS — L03119 Cellulitis of unspecified part of limb: Secondary | ICD-10-CM

## 2013-11-17 DIAGNOSIS — R5381 Other malaise: Secondary | ICD-10-CM | POA: Diagnosis present

## 2013-11-17 DIAGNOSIS — I442 Atrioventricular block, complete: Secondary | ICD-10-CM | POA: Diagnosis present

## 2013-11-17 DIAGNOSIS — A419 Sepsis, unspecified organism: Secondary | ICD-10-CM | POA: Diagnosis not present

## 2013-11-17 DIAGNOSIS — I471 Supraventricular tachycardia, unspecified: Secondary | ICD-10-CM

## 2013-11-17 DIAGNOSIS — N179 Acute kidney failure, unspecified: Secondary | ICD-10-CM

## 2013-11-17 DIAGNOSIS — R42 Dizziness and giddiness: Secondary | ICD-10-CM

## 2013-11-17 DIAGNOSIS — Z87891 Personal history of nicotine dependence: Secondary | ICD-10-CM

## 2013-11-17 DIAGNOSIS — Z880 Allergy status to penicillin: Secondary | ICD-10-CM | POA: Diagnosis not present

## 2013-11-17 DIAGNOSIS — R001 Bradycardia, unspecified: Secondary | ICD-10-CM

## 2013-11-17 DIAGNOSIS — K5900 Constipation, unspecified: Secondary | ICD-10-CM | POA: Diagnosis not present

## 2013-11-17 DIAGNOSIS — R058 Other specified cough: Secondary | ICD-10-CM

## 2013-11-17 DIAGNOSIS — E876 Hypokalemia: Secondary | ICD-10-CM | POA: Diagnosis present

## 2013-11-17 DIAGNOSIS — N3946 Mixed incontinence: Secondary | ICD-10-CM

## 2013-11-17 DIAGNOSIS — N10 Acute tubulo-interstitial nephritis: Secondary | ICD-10-CM

## 2013-11-17 DIAGNOSIS — N12 Tubulo-interstitial nephritis, not specified as acute or chronic: Secondary | ICD-10-CM | POA: Diagnosis present

## 2013-11-17 DIAGNOSIS — M1990 Unspecified osteoarthritis, unspecified site: Secondary | ICD-10-CM

## 2013-11-17 DIAGNOSIS — R0602 Shortness of breath: Secondary | ICD-10-CM

## 2013-11-17 DIAGNOSIS — Z5189 Encounter for other specified aftercare: Secondary | ICD-10-CM | POA: Diagnosis not present

## 2013-11-17 DIAGNOSIS — I5032 Chronic diastolic (congestive) heart failure: Secondary | ICD-10-CM

## 2013-11-17 DIAGNOSIS — R112 Nausea with vomiting, unspecified: Secondary | ICD-10-CM

## 2013-11-17 DIAGNOSIS — R059 Cough, unspecified: Secondary | ICD-10-CM

## 2013-11-17 DIAGNOSIS — R05 Cough: Secondary | ICD-10-CM

## 2013-11-17 DIAGNOSIS — L03116 Cellulitis of left lower limb: Secondary | ICD-10-CM

## 2013-11-17 DIAGNOSIS — R509 Fever, unspecified: Secondary | ICD-10-CM

## 2013-11-17 DIAGNOSIS — R5383 Other fatigue: Secondary | ICD-10-CM

## 2013-11-17 DIAGNOSIS — A4151 Sepsis due to Escherichia coli [E. coli]: Secondary | ICD-10-CM | POA: Diagnosis present

## 2013-11-17 DIAGNOSIS — Z6841 Body Mass Index (BMI) 40.0 and over, adult: Secondary | ICD-10-CM | POA: Diagnosis not present

## 2013-11-17 HISTORY — DX: Nonrheumatic aortic (valve) stenosis: I35.0

## 2013-11-17 HISTORY — DX: Acute kidney failure, unspecified: N17.9

## 2013-11-17 HISTORY — DX: Heart failure, unspecified: I50.9

## 2013-11-17 LAB — COMPREHENSIVE METABOLIC PANEL
ALT: 35 U/L (ref 0–35)
AST: 34 U/L (ref 0–37)
Albumin: 2.7 g/dL — ABNORMAL LOW (ref 3.5–5.2)
Alkaline Phosphatase: 86 U/L (ref 39–117)
Anion gap: 17 — ABNORMAL HIGH (ref 5–15)
BUN: 20 mg/dL (ref 6–23)
CO2: 23 mEq/L (ref 19–32)
Calcium: 9.3 mg/dL (ref 8.4–10.5)
Chloride: 95 mEq/L — ABNORMAL LOW (ref 96–112)
Creatinine, Ser: 1 mg/dL (ref 0.50–1.10)
GFR calc Af Amer: 66 mL/min — ABNORMAL LOW (ref >90)
GFR calc non Af Amer: 57 mL/min — ABNORMAL LOW (ref >90)
Glucose, Bld: 178 mg/dL — ABNORMAL HIGH (ref 70–99)
Potassium: 3.2 mEq/L — ABNORMAL LOW (ref 3.7–5.3)
Sodium: 135 mEq/L — ABNORMAL LOW (ref 137–147)
Total Bilirubin: 1.1 mg/dL (ref 0.3–1.2)
Total Protein: 8.8 g/dL — ABNORMAL HIGH (ref 6.0–8.3)

## 2013-11-17 LAB — CBC WITH DIFFERENTIAL/PLATELET
Basophils Absolute: 0 10*3/uL (ref 0.0–0.1)
Basophils Relative: 0 % (ref 0–1)
Eosinophils Absolute: 0 10*3/uL (ref 0.0–0.7)
Eosinophils Relative: 0 % (ref 0–5)
HCT: 33.5 % — ABNORMAL LOW (ref 36.0–46.0)
Hemoglobin: 11.1 g/dL — ABNORMAL LOW (ref 12.0–15.0)
Lymphocytes Relative: 7 % — ABNORMAL LOW (ref 12–46)
Lymphs Abs: 1.4 10*3/uL (ref 0.7–4.0)
MCH: 24.9 pg — ABNORMAL LOW (ref 26.0–34.0)
MCHC: 33.1 g/dL (ref 30.0–36.0)
MCV: 75.3 fL — ABNORMAL LOW (ref 78.0–100.0)
Monocytes Absolute: 1.2 10*3/uL — ABNORMAL HIGH (ref 0.1–1.0)
Monocytes Relative: 6 % (ref 3–12)
Neutro Abs: 17 10*3/uL — ABNORMAL HIGH (ref 1.7–7.7)
Neutrophils Relative %: 87 % — ABNORMAL HIGH (ref 43–77)
Platelets: 356 10*3/uL (ref 150–400)
RBC: 4.45 MIL/uL (ref 3.87–5.11)
RDW: 15.8 % — ABNORMAL HIGH (ref 11.5–15.5)
WBC: 19.6 10*3/uL — ABNORMAL HIGH (ref 4.0–10.5)

## 2013-11-17 LAB — URINALYSIS, ROUTINE W REFLEX MICROSCOPIC
Glucose, UA: NEGATIVE mg/dL
Ketones, ur: NEGATIVE mg/dL
Nitrite: POSITIVE — AB
Protein, ur: 30 mg/dL — AB
Specific Gravity, Urine: 1.023 (ref 1.005–1.030)
Urobilinogen, UA: 2 mg/dL — ABNORMAL HIGH (ref 0.0–1.0)
pH: 5 (ref 5.0–8.0)

## 2013-11-17 LAB — URINE MICROSCOPIC-ADD ON

## 2013-11-17 LAB — GLUCOSE, CAPILLARY
Glucose-Capillary: 119 mg/dL — ABNORMAL HIGH (ref 70–99)
Glucose-Capillary: 149 mg/dL — ABNORMAL HIGH (ref 70–99)

## 2013-11-17 LAB — I-STAT TROPONIN, ED: Troponin i, poc: 0.07 ng/mL (ref 0.00–0.08)

## 2013-11-17 LAB — TROPONIN I
Troponin I: <0.3 ng/mL (ref <0.30)
Troponin I: <0.3 ng/mL (ref <0.30)

## 2013-11-17 LAB — PRO B NATRIURETIC PEPTIDE: Pro B Natriuretic peptide (BNP): 1875 pg/mL — ABNORMAL HIGH (ref 0–125)

## 2013-11-17 LAB — I-STAT CG4 LACTIC ACID, ED: Lactic Acid, Venous: 1.83 mmol/L (ref 0.5–2.2)

## 2013-11-17 LAB — LIPASE, BLOOD: Lipase: 22 U/L (ref 11–59)

## 2013-11-17 IMAGING — CR DG PELVIS 1-2V
1 series · 1 of 1 positions shown · non-contrast
Comparison: None.

CLINICAL DATA: Fall.  Pelvic pain

EXAM:
PELVIS - 1-2 VIEW

[t pelvis ap]
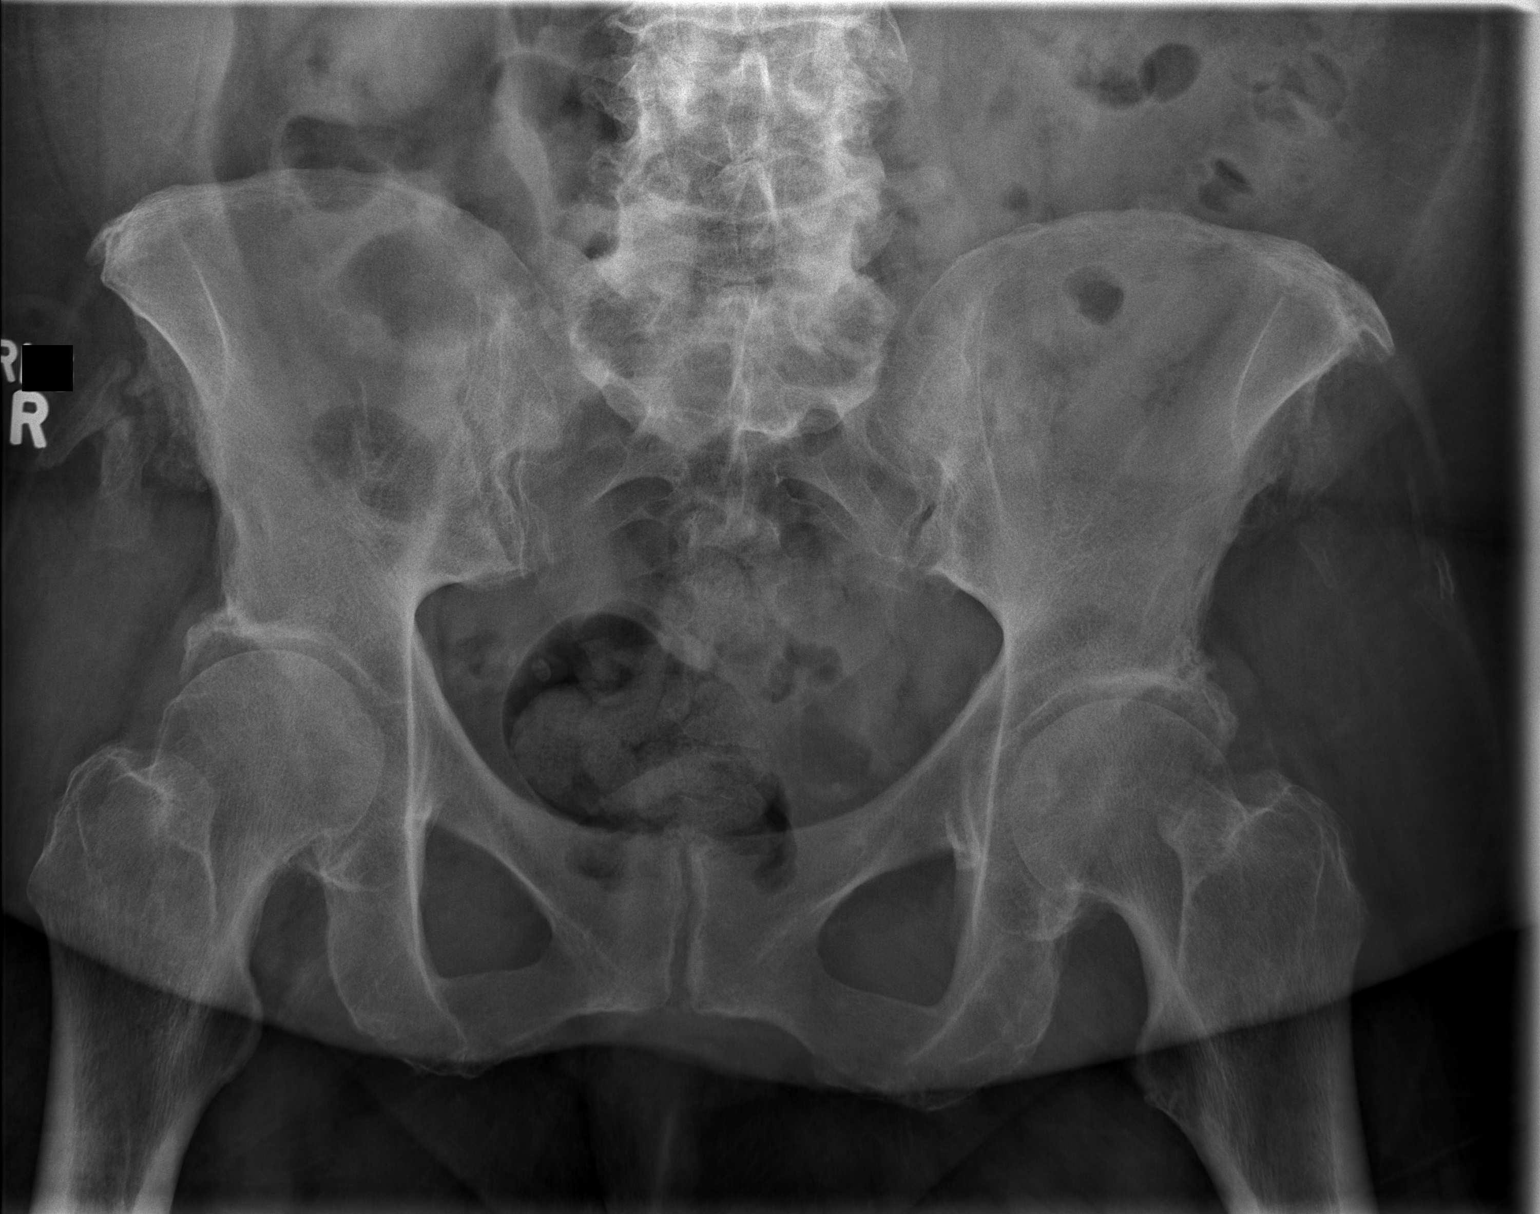

[1 of 1 positions shown; findings below may reference images not displayed]

FINDINGS: There is no evidence of pelvic fracture or diastasis. No other
pelvic bone lesions are seen. Mild degenerative changes seen
involving the pubic symphysis and hip joints.
IMPRESSION: No acute findings.

## 2013-11-17 IMAGING — CR DG CHEST 2V
2 series · 2 of 2 positions shown · non-contrast
Comparison: [DATE] chest radiograph and chest CT

CLINICAL DATA: Difficulty breathing

EXAM:
CHEST  2 VIEW

[x chest ap]
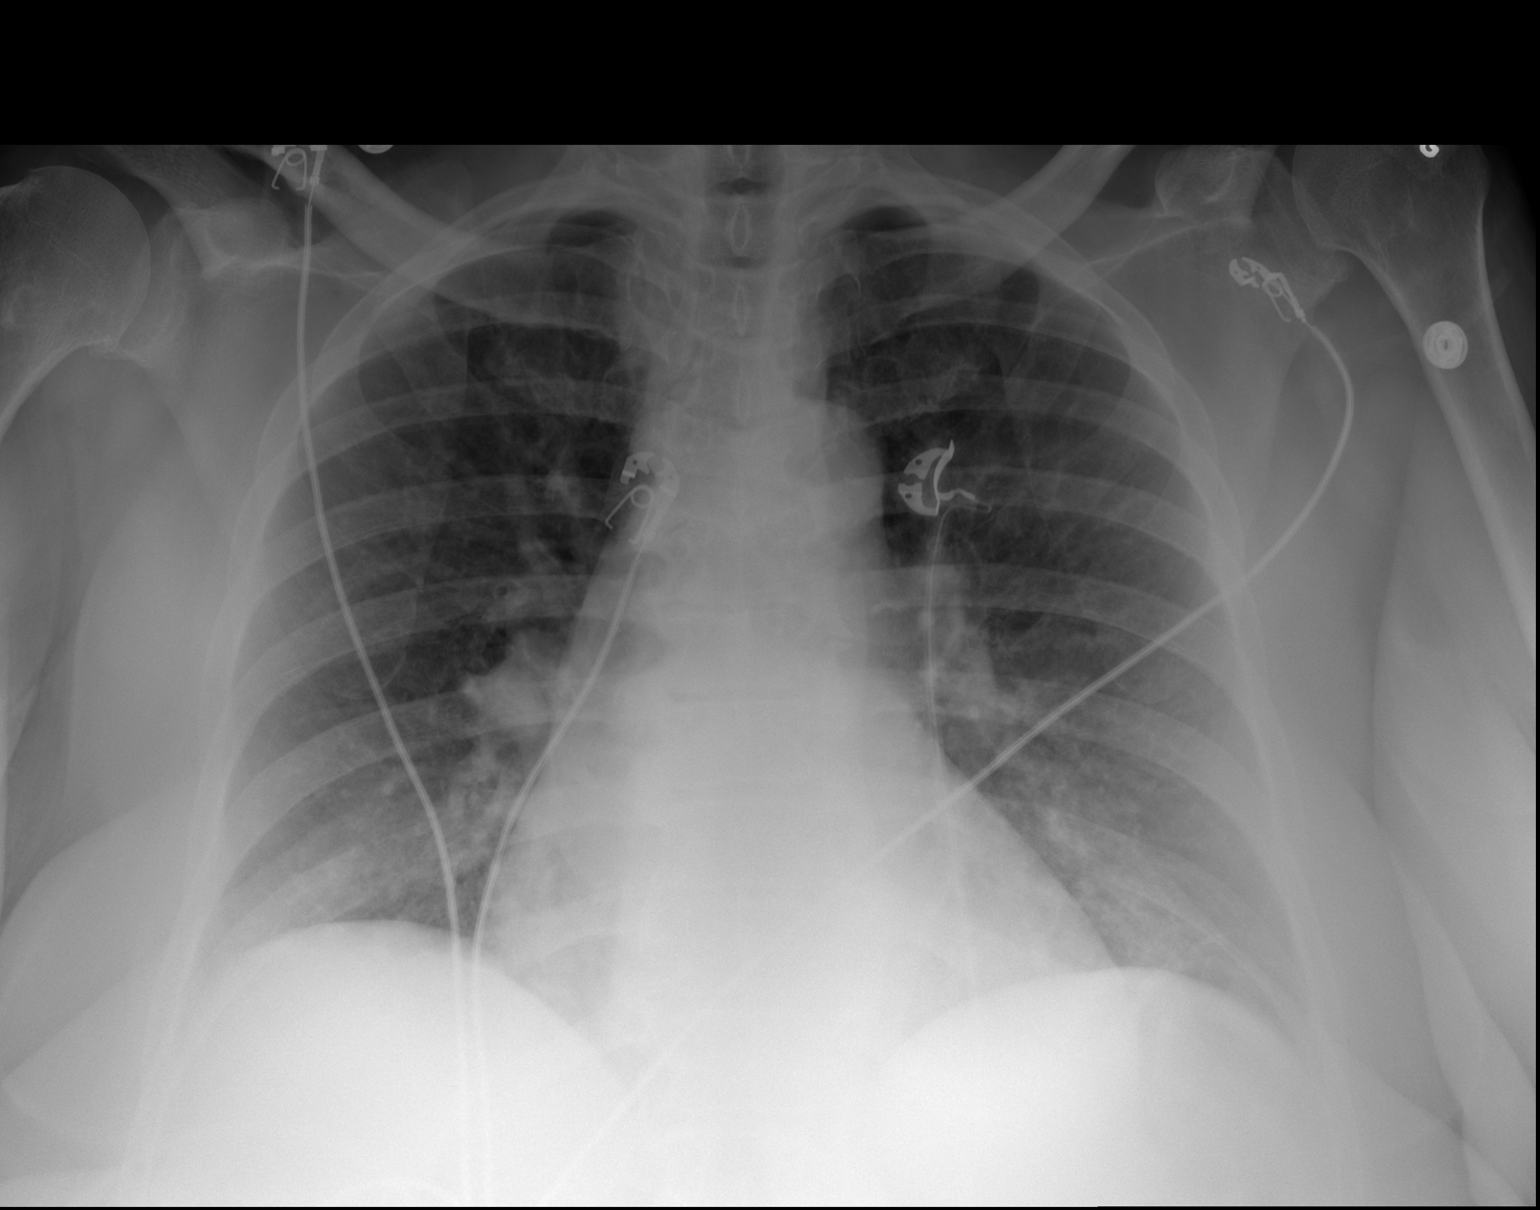

[w chest lat]
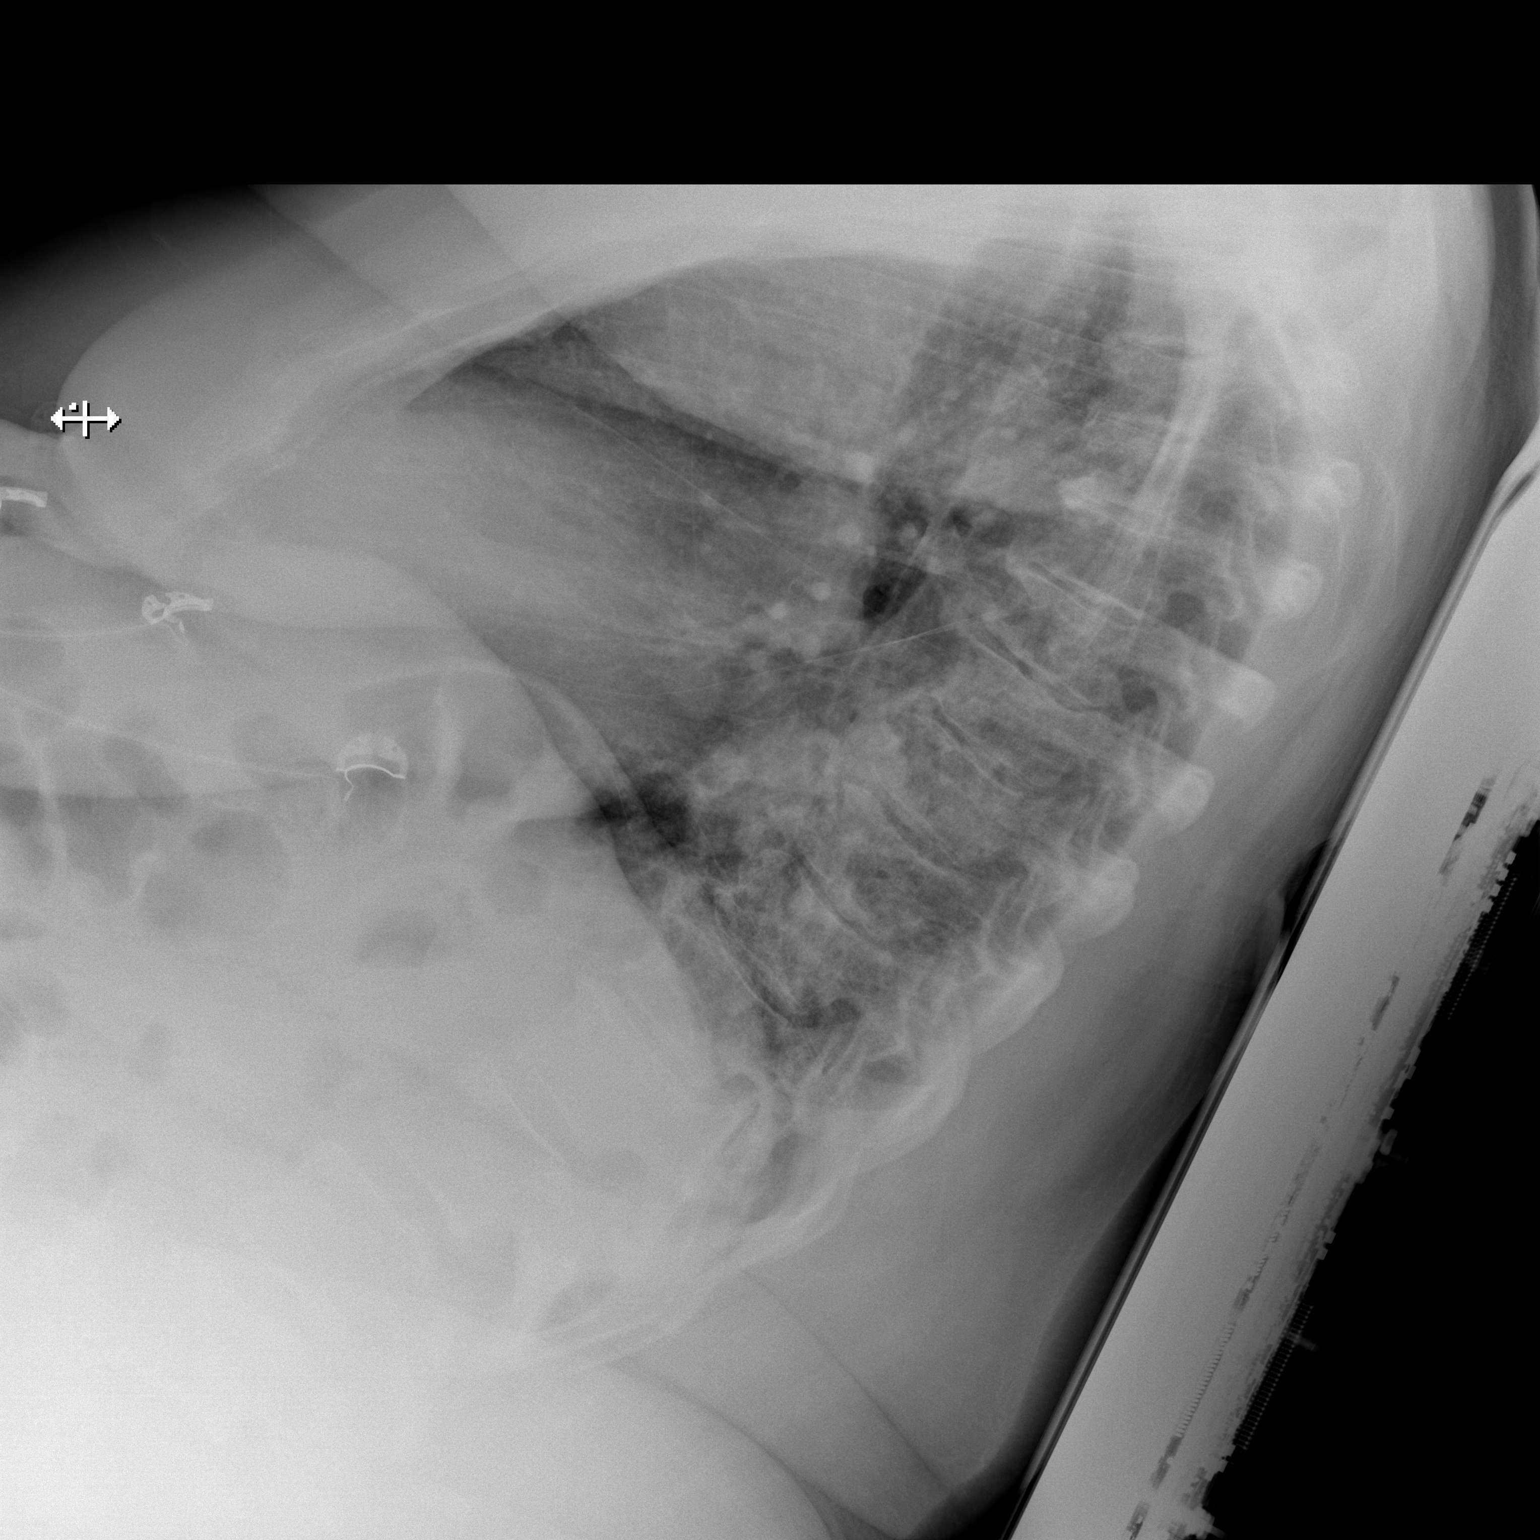

[2 of 2 positions shown; findings below may reference images not displayed]

FINDINGS: There is no edema or consolidation. The heart size and pulmonary
vascularity are normal. No adenopathy. There is degenerative change
in the thoracic spine.
IMPRESSION: No edema or consolidation.

## 2013-11-17 IMAGING — CR DG LUMBAR SPINE COMPLETE 4+V
6 series · 6 of 6 positions shown · non-contrast
Comparison: None.

CLINICAL DATA: Weakness, CVA rib back pain

EXAM:
LUMBAR SPINE - COMPLETE 4+ VIEW

[t lumbar spine ap]
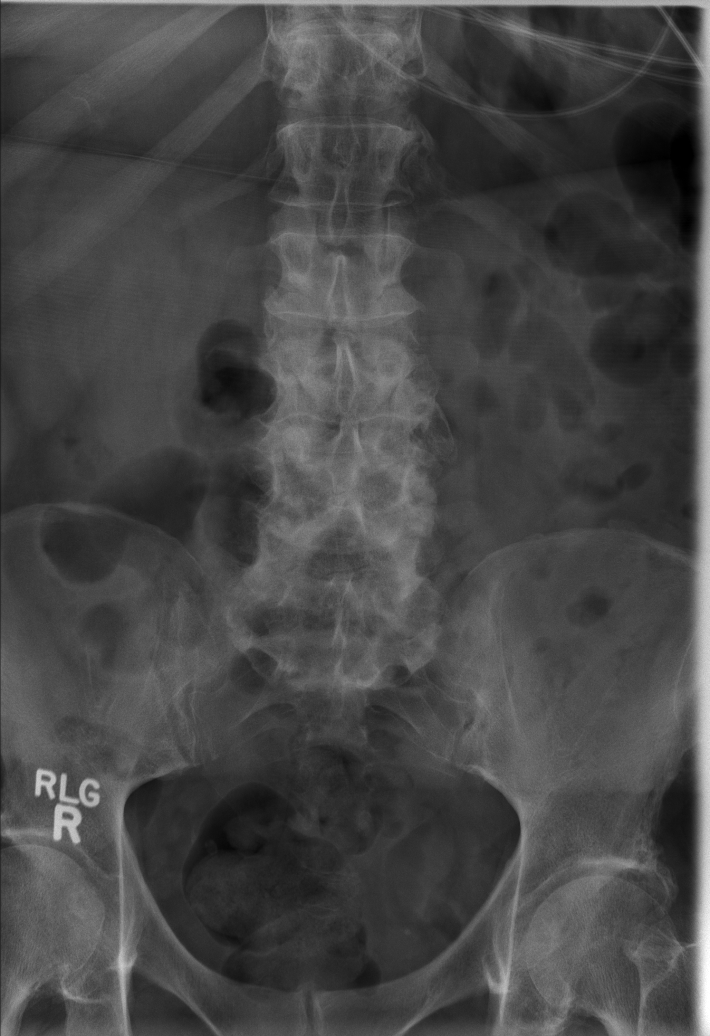

[t lumbar spine obl (1 of 2)]
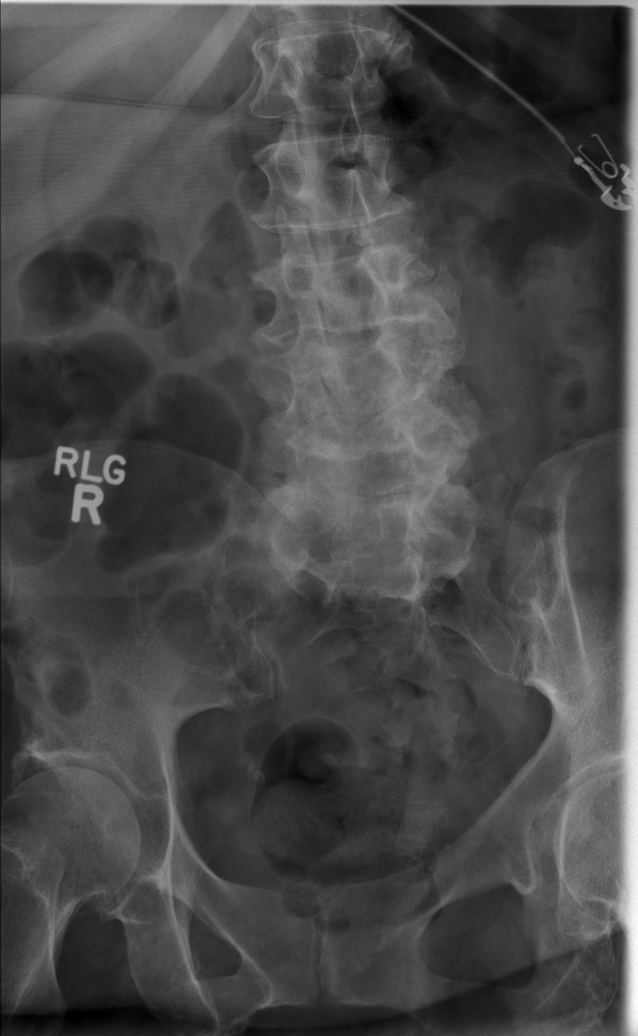

[t lumbar spine obl (2 of 2)]
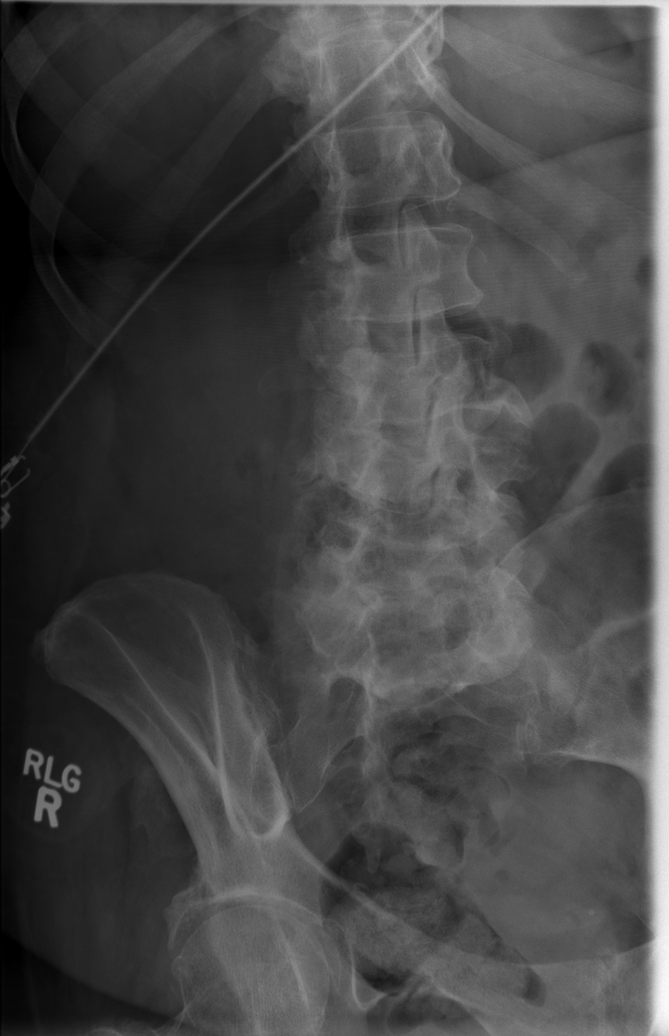

[t lumbar spine lat (1 of 2)]
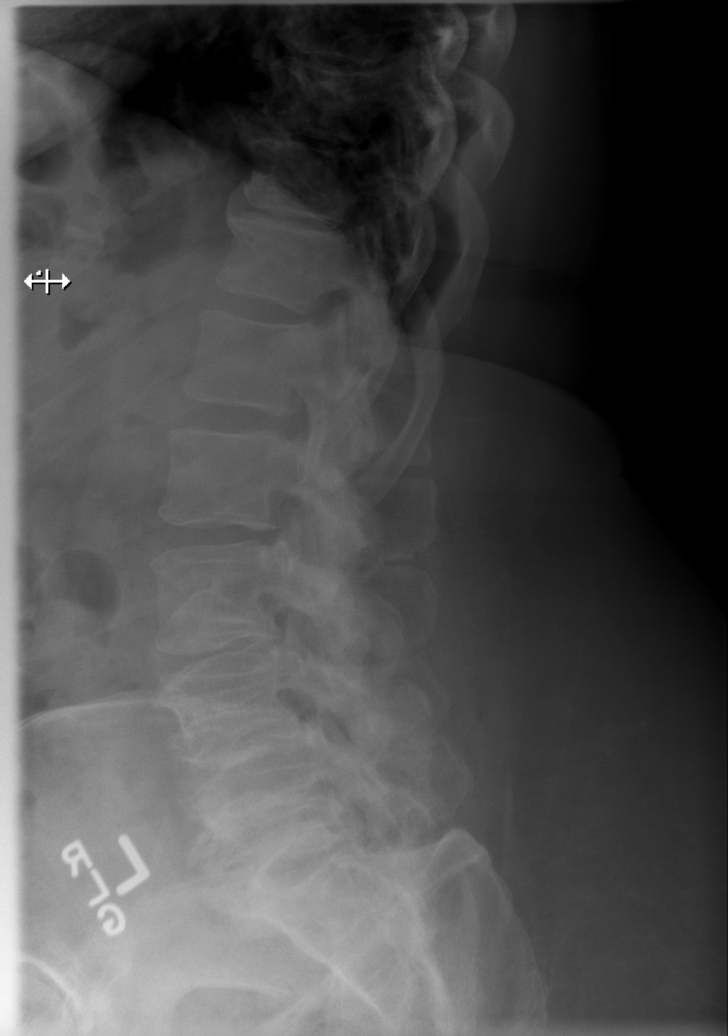

[t lumbar spine lat (2 of 2)]
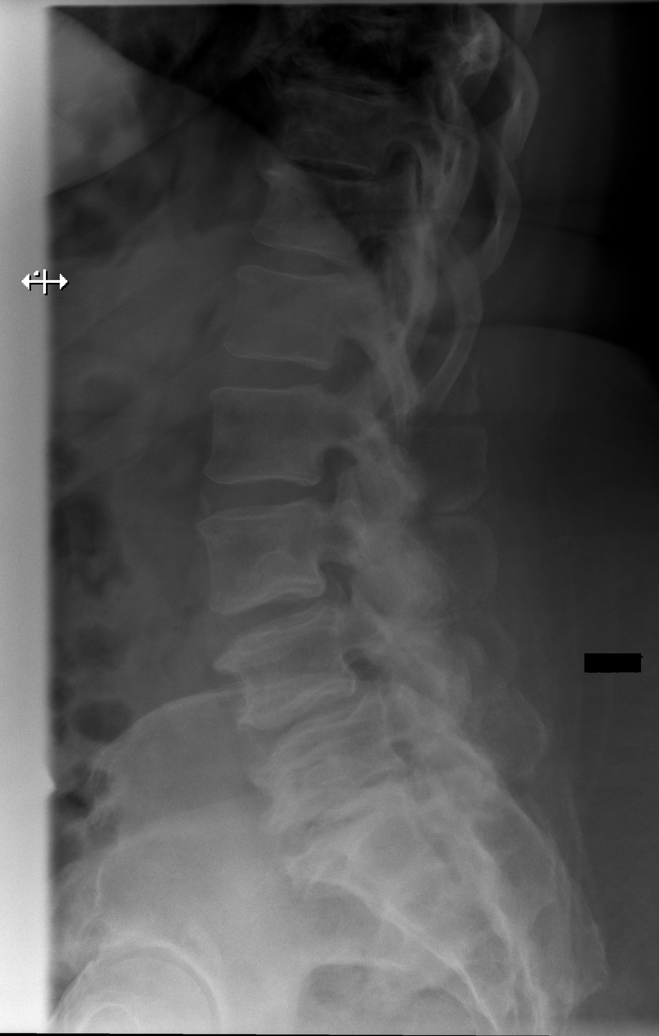

[t lumbar l-5 s-1 spot]
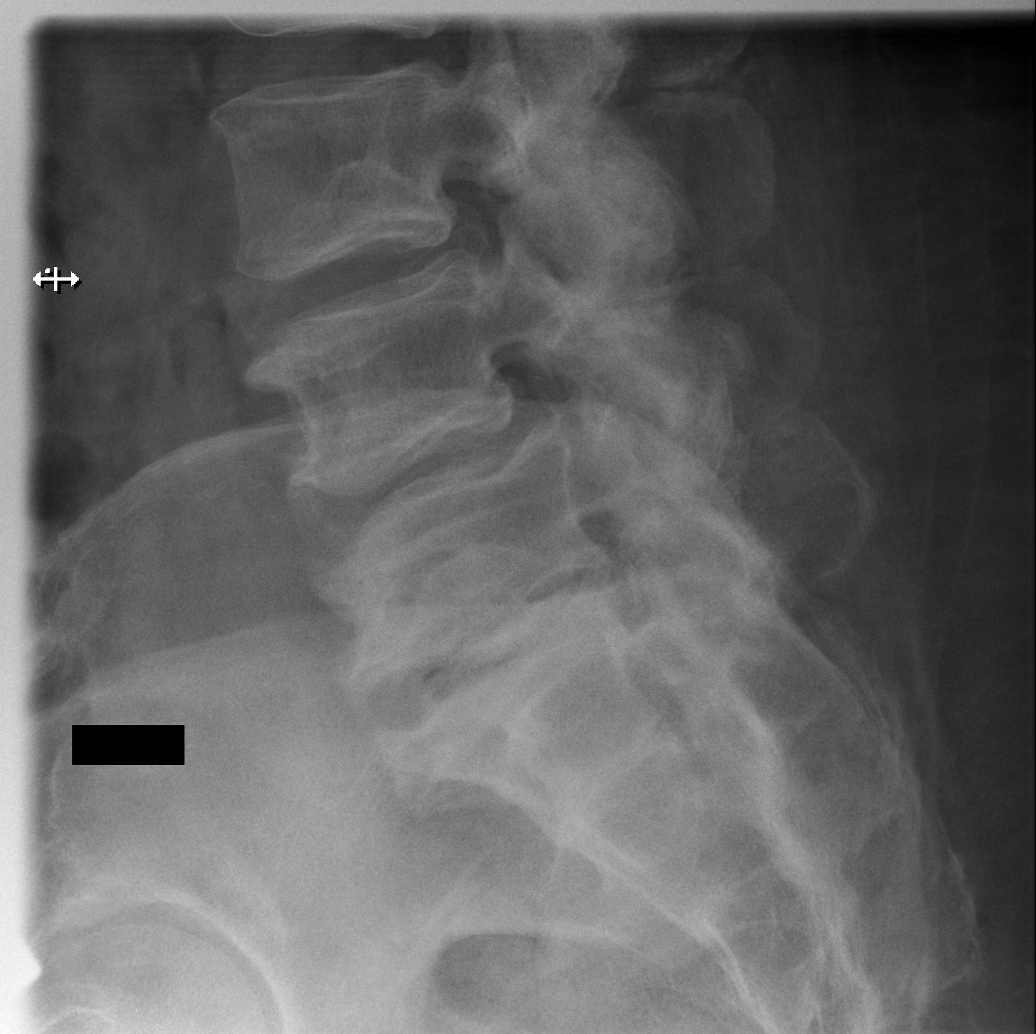

[6 of 6 positions shown; findings below may reference images not displayed]

FINDINGS: Five views of lumbar spine submitted. There is disc space flattening
with anterior spurring at L4-L5 and L5-S1 level. Narrowing of neural
foramina noted bilaterally at L5-S1 level. Facet degenerative
changes L3-L4 and L5 level. Anterior spurring upper endplate of L4
vertebral body. Degenerative changes lower thoracic spine. No acute
fracture or subluxation.
IMPRESSION: No acute fracture or subluxation. Degenerative changes as described
above.

## 2013-11-17 MED ORDER — DEXTROSE 5 % IV SOLN
1.0000 g | Freq: Once | INTRAVENOUS | Status: AC
  Administered 2013-11-17: 1 g via INTRAVENOUS
  Filled 2013-11-17: qty 10

## 2013-11-17 MED ORDER — ONDANSETRON HCL 4 MG/2ML IJ SOLN
4.0000 mg | Freq: Once | INTRAMUSCULAR | Status: AC
  Administered 2013-11-17: 4 mg via INTRAVENOUS
  Filled 2013-11-17: qty 2

## 2013-11-17 MED ORDER — VANCOMYCIN HCL 10 G IV SOLR
1500.0000 mg | Freq: Two times a day (BID) | INTRAVENOUS | Status: DC
  Administered 2013-11-18: 1500 mg via INTRAVENOUS
  Filled 2013-11-17 (×2): qty 1500

## 2013-11-17 MED ORDER — LEVOFLOXACIN IN D5W 750 MG/150ML IV SOLN
750.0000 mg | INTRAVENOUS | Status: DC
  Administered 2013-11-17: 750 mg via INTRAVENOUS
  Filled 2013-11-17 (×2): qty 150

## 2013-11-17 MED ORDER — ACETAMINOPHEN 650 MG RE SUPP: 650.0000 mg | Freq: Four times a day (QID) | RECTAL | Status: DC | PRN

## 2013-11-17 MED ORDER — ASPIRIN EC 81 MG PO TBEC
81.0000 mg | DELAYED_RELEASE_TABLET | Freq: Every day | ORAL | Status: DC
  Administered 2013-11-18 – 2013-11-23 (×6): 81 mg via ORAL
  Filled 2013-11-17 (×9): qty 1

## 2013-11-17 MED ORDER — MORPHINE SULFATE 4 MG/ML IJ SOLN
4.0000 mg | Freq: Once | INTRAMUSCULAR | Status: AC
  Administered 2013-11-17: 4 mg via INTRAVENOUS
  Filled 2013-11-17: qty 1

## 2013-11-17 MED ORDER — POTASSIUM CHLORIDE CRYS ER 20 MEQ PO TBCR
40.0000 meq | EXTENDED_RELEASE_TABLET | Freq: Every day | ORAL | Status: AC
Start: 2013-11-18 — End: 2013-11-19
  Administered 2013-11-18 – 2013-11-19 (×2): 40 meq via ORAL
  Filled 2013-11-17 (×2): qty 2

## 2013-11-17 MED ORDER — POTASSIUM CHLORIDE CRYS ER 20 MEQ PO TBCR
40.0000 meq | EXTENDED_RELEASE_TABLET | ORAL | Status: AC
  Administered 2013-11-17 (×2): 40 meq via ORAL
  Filled 2013-11-17 (×2): qty 2

## 2013-11-17 MED ORDER — HEPARIN SODIUM (PORCINE) 5000 UNIT/ML IJ SOLN
5000.0000 [IU] | Freq: Three times a day (TID) | INTRAMUSCULAR | Status: DC
  Administered 2013-11-17 – 2013-11-23 (×17): 5000 [IU] via SUBCUTANEOUS
  Filled 2013-11-17 (×23): qty 1

## 2013-11-17 MED ORDER — INSULIN ASPART 100 UNIT/ML ~~LOC~~ SOLN
0.0000 [IU] | Freq: Three times a day (TID) | SUBCUTANEOUS | Status: DC
  Administered 2013-11-18 – 2013-11-22 (×4): 2 [IU] via SUBCUTANEOUS

## 2013-11-17 MED ORDER — METOPROLOL TARTRATE 100 MG PO TABS
100.0000 mg | ORAL_TABLET | Freq: Two times a day (BID) | ORAL | Status: DC
  Administered 2013-11-17 – 2013-11-19 (×4): 100 mg via ORAL
  Filled 2013-11-17 (×5): qty 1

## 2013-11-17 MED ORDER — ONDANSETRON HCL 4 MG PO TABS
4.0000 mg | ORAL_TABLET | Freq: Four times a day (QID) | ORAL | Status: DC | PRN
  Administered 2013-11-18 – 2013-11-22 (×5): 4 mg via ORAL
  Filled 2013-11-17 (×5): qty 1

## 2013-11-17 MED ORDER — ONDANSETRON HCL 4 MG/2ML IJ SOLN: 4.0000 mg | Freq: Four times a day (QID) | INTRAMUSCULAR | Status: DC | PRN

## 2013-11-17 MED ORDER — ACETAMINOPHEN 325 MG PO TABS: 325.0000 mg | ORAL_TABLET | Freq: Four times a day (QID) | ORAL | Status: DC | PRN

## 2013-11-17 MED ORDER — VANCOMYCIN HCL 10 G IV SOLR
2500.0000 mg | Freq: Once | INTRAVENOUS | Status: AC
  Administered 2013-11-17: 2500 mg via INTRAVENOUS
  Filled 2013-11-17: qty 2500

## 2013-11-17 MED ORDER — SODIUM CHLORIDE 0.9 % IJ SOLN
3.0000 mL | Freq: Two times a day (BID) | INTRAMUSCULAR | Status: DC
  Administered 2013-11-18 – 2013-11-22 (×7): 3 mL via INTRAVENOUS

## 2013-11-17 MED ORDER — FUROSEMIDE 10 MG/ML IJ SOLN
20.0000 mg | Freq: Two times a day (BID) | INTRAMUSCULAR | Status: DC
  Administered 2013-11-17: 20 mg via INTRAVENOUS
  Filled 2013-11-17 (×4): qty 2

## 2013-11-17 MED ORDER — ACETAMINOPHEN 325 MG PO TABS
650.0000 mg | ORAL_TABLET | Freq: Four times a day (QID) | ORAL | Status: DC | PRN
  Administered 2013-11-17 – 2013-11-20 (×5): 650 mg via ORAL
  Filled 2013-11-17 (×7): qty 2

## 2013-11-17 NOTE — ED Notes (Signed)
Patient transported to X-ray 

## 2013-11-17 NOTE — Progress Notes (Signed)
  Echocardiogram 2D Echocardiogram has been performed.  Diamond Nickel 11/17/2013, 4:03 PM

## 2013-11-17 NOTE — Progress Notes (Signed)
  CARE MANAGEMENT ED NOTE 11/17/2013  Patient:  [name] [name], [name] [name]   Account Number:  [id number]  Date Initiated:  11/17/2013  Documentation initiated by:  Jackelyn Poling  Subjective/Objective Assessment:   68 yr old blue [place] pt coming from [facility], c/o weakness/dizziness/shortness of breath/abdominal pain x 1 week, Pt has also had increased edema in lower extremities, hx of CHF, legs are weeping.     Subjective/Objective Assessment Detail:   pcp Regina Baity  Pt states she has never had a home health agency to assist her States she will review home health agency list and provide her choice later  Pt presently being assisted by her 85W Rn, and aides     Action/Plan:   ED CM consulted by Admission RN Kim on Cm consult entered for possible need for home health services CM spoke with pt briefly on [id number] to assess possible needs & past hx with home health agencies Pending pt response of choice & entered   Action/Plan Detail:   orders for home health   Anticipated DC Date:  11/17/2013     Status Recommendation to Physician:   Result of Recommendation:    Other ED Services  Consult [facility]  Other  Outpatient Services - Pt will follow up   [place]   Choice offered to / List presented to:  C-1 Patient          Status of service:  Completed, signed off  ED Comments:   ED Comments Detail:

## 2013-11-17 NOTE — Assessment & Plan Note (Addendum)
Unclear etiology, ? pulm vs Subclinical UTI vs GI related vs other - needs UA and further evaluation, with other symptoms today will need transferred to ER for urgent labs/imaging, as pt is essentially unable to currently ambulate, care for herself, and little support at home  Note:  Total time for pt hx, exam, review of record with pt in the room, determination of diagnoses and plan for further eval and tx is > 40 min, with over 50% spent in coordination and counseling of patient

## 2013-11-17 NOTE — H&P (Signed)
PCP:   Webb Silversmith, NP   Chief Complaint:  Fatigue  HPI:  68 year old female who  has a past medical history of Edema; Palpitations; History of diastolic dysfunction; Arthritis; OA (osteoarthritis); PAC (premature atrial contraction); PVC's (premature ventricular contractions); Morbid obesity; HTN (hypertension); Tachycardia; SVT (supraventricular tachycardia); Aortic stenosis, mild (11/17/2013); and CHF (congestive heart failure). Today presents to the hospital with worsening dyspnea on exertion, and fatigue. Patient says that she gets short of breath while walking short distances, unable to climb 1 flight of stairs. She also complains of nausea and vomiting, no fever. Denies chest pain, no syncope. No history of seizures or stroke in the past. Patient does have a history of aortic stenosis, last echocardiogram in October 2013 showed index valve area  of 1 cm square. In the ED patient was found to have abnormal UA and leukocytosis. Started on IV Rocephin. Chest x-ray did not show any significant abnormality.  Allergies:   Allergies  Allergen Reactions  . Oxycodone Hcl Nausea And Vomiting  . Penicillins Itching      Past Medical History  Diagnosis Date  . Edema   . Palpitations   . History of diastolic dysfunction     Echo 05/8001 with diastolic dysfunction  . Arthritis   . OA (osteoarthritis)   . PAC (premature atrial contraction)     per prior Holter  . PVC's (premature ventricular contractions)     per prior Holter  . Morbid obesity   . HTN (hypertension)   . Tachycardia     noted at 07/28/11 visit. Started on beta blocker. Possible atrial flutter vs long PR tachycardia/AVNRT  . SVT (supraventricular tachycardia)   . Aortic stenosis, mild 11/17/2013  . CHF (congestive heart failure)     Past Surgical History  Procedure Laterality Date  . Cesarean section    . US echocardiography  07/25/2008    EF 55-60%    Prior to Admission medications   Medication Sig Start Date  End Date Taking? Authorizing Provider  acetaminophen (TYLENOL) 325 MG tablet Take 325-650 mg by mouth every 6 (six) hours as needed for moderate pain.    Yes Historical Provider, MD  aspirin EC 81 MG tablet Take 81 mg by mouth daily with breakfast.    Yes Historical Provider, MD  furosemide (LASIX) 40 MG tablet Take 40 mg by mouth daily as needed for edema.   Yes Historical Provider, MD  losartan (COZAAR) 100 MG tablet Take 100 mg by mouth daily.   Yes Historical Provider, MD  metFORMIN (GLUCOPHAGE) 500 MG tablet Take 500 mg by mouth 2 (two) times daily with a meal.   Yes Historical Provider, MD  metoprolol (LOPRESSOR) 100 MG tablet Take 100 mg by mouth 2 (two) times daily.   Yes Historical Provider, MD  omeprazole (PRILOSEC) 20 MG capsule Take 20 mg by mouth 2 (two) times daily before a meal.   Yes Historical Provider, MD  traMADol (ULTRAM) 50 MG tablet Take by mouth every 6 (six) hours as needed.   Yes Historical Provider, MD    Social History:  reports that she quit smoking about 28 years ago. Her smoking use included Cigarettes. She smoked 0.00 packs per day. She has never used smokeless tobacco. She reports that she does not drink alcohol or use illicit drugs.  Family History  Problem Relation Age of Onset  . Alzheimer's disease Mother   . Lung cancer Mother   . COPD Father   . Diabetes Maternal Uncle   .  Stroke Neg Hx      All the positives are listed in BOLD  Review of Systems:  HEENT: Headache, blurred vision, runny nose, sore throat Neck: Hypothyroidism, hyperthyroidism,,lymphadenopathy Chest : Shortness of breath, history of COPD, Asthma Heart : Chest pain, history of coronary arterey disease GI:  Nausea, vomiting, diarrhea, constipation, GERD GU: Dysuria, urgency, frequency of urination, hematuria Neuro: Stroke, seizures, syncope Psych: Depression, anxiety, hallucinations   Physical Exam: Blood pressure 143/80, pulse 89, temperature 99.5 F (37.5 C), temperature  source Oral, resp. rate 18, height 5\' 11"  (1.803 m), weight 150.5 kg (331 lb 12.7 oz), SpO2 98.00%. Constitutional:   Patient is a morbidly obese female* in no acute distress and cooperative with exam. Head: Normocephalic and atraumatic Mouth: Mucus membranes moist Eyes: PERRL, EOMI, conjunctivae normal Neck: Supple, No Thyromegaly Cardiovascular: RRR, grade 3/6 systolic murmur auscultated at the aortic area  Pulmonary/Chest: CTAB, no wheezes, rales, or rhonchi Abdominal: Soft. Non-tender, non-distended, bowel sounds are normal, no masses, organomegaly, or guarding present.  Neurological: A&O x3, Strenght is normal and symmetric bilaterally, cranial nerve II-XII are grossly intact, no focal motor deficit, sensory intact to light touch bilaterally.  Extremities : Extensive bilateral lymphedema, positive erythema and warmth in the left lower extremity  Labs on Admission:  Basic Metabolic Panel:  Recent Labs Lab 11/17/13 1036  NA 135*  K 3.2*  CL 95*  CO2 23  GLUCOSE 178*  BUN 20  CREATININE 1.00  CALCIUM 9.3   Liver Function Tests:  Recent Labs Lab 11/17/13 1036  AST 34  ALT 35  ALKPHOS 86  BILITOT 1.1  PROT 8.8*  ALBUMIN 2.7*    Recent Labs Lab 11/17/13 1036  LIPASE 22   No results found for this basename: AMMONIA,  in the last 168 hours CBC:  Recent Labs Lab 11/17/13 1036  WBC 19.6*  NEUTROABS 17.0*  HGB 11.1*  HCT 33.5*  MCV 75.3*  PLT 356   Cardiac Enzymes: No results found for this basename: CKTOTAL, CKMB, CKMBINDEX, TROPONINI,  in the last 168 hours  BNP (last 3 results)  Recent Labs  08/02/13 1725 11/17/13 1035  PROBNP 601.2* 1875.0*   CBG: No results found for this basename: GLUCAP,  in the last 168 hours  Radiological Exams on Admission: Dg Chest 2 View  11/17/2013   CLINICAL DATA:  Difficulty breathing  EXAM: CHEST  2 VIEW  COMPARISON:  August 02, 2013 chest radiograph and chest CT  FINDINGS: There is no edema or consolidation. The  heart size and pulmonary vascularity are normal. No adenopathy. There is degenerative change in the thoracic spine.  IMPRESSION: No edema or consolidation.   Electronically Signed   By: Lowella Grip M.D.   On: 11/17/2013 11:27   Dg Lumbar Spine Complete  11/17/2013   CLINICAL DATA:  Weakness, CVA rib back pain  EXAM: LUMBAR SPINE - COMPLETE 4+ VIEW  COMPARISON:  None.  FINDINGS: Five views of lumbar spine submitted. There is disc space flattening with anterior spurring at L4-L5 and L5-S1 level. Narrowing of neural foramina noted bilaterally at L5-S1 level. Facet degenerative changes L3-L4 and L5 level. Anterior spurring upper endplate of L4 vertebral body. Degenerative changes lower thoracic spine. No acute fracture or subluxation.  IMPRESSION: No acute fracture or subluxation. Degenerative changes as described above.   Electronically Signed   By: Lahoma Crocker M.D.   On: 11/17/2013 11:38   Dg Pelvis 1-2 Views  11/17/2013   CLINICAL DATA:  Fall.  Pelvic pain  EXAM: PELVIS - 1-2 VIEW  COMPARISON:  None.  FINDINGS: There is no evidence of pelvic fracture or diastasis. No other pelvic bone lesions are seen. Mild degenerative changes seen involving the pubic symphysis and hip joints.  IMPRESSION: No acute findings.   Electronically Signed   By: Earle Gell M.D.   On: 11/17/2013 11:35    EKG: Independently reviewed.  Nonspecific IVCD with LAD Left ventricular hypertrophy    Assessment/Plan Active Problems:   UTI (lower urinary tract infection)   Sepsis Aortic stenosis Left leg cellulitis Diabetes mellitus Morbid obesity  Sepsis due to UTI and cellulitis Patient will be started on vancomycin per pharmacy consultation, and Levaquin. We'll follow urine culture results. Patient is not hypotensive, so we'll not give IV fluids as she does have elevated BNP.  Dyspnea on exertion Patient is exhibiting NYHA class III dyspnea. She does have a history of aortic stenosis, as per echo of 2013 the index  valve area of the aortic valve was 1 cm2, and now patient has severe dyspnea on exertion. I will repeat an echocardiogram, and if echo shows worsening of the valve area, will consult cardiology. In the meantime we'll start the patient on Lasix 20 mg IV every 12 hours, will hold Candesartan. BNP today is 1875, we'll start Lasix 20 mg IV every 12 hours. Will check troponin I. every 6 hours x3.  Diabetes mellitus Will hold oral hypoglycemics and start the patient on sliding-scale insulin  Hypokalemia Replace potassium and check BMP in a.m.  Done done done Heparin  Code status: patient is full code  Family discussion: no family at bedside, Admission, patients condition and plan of care including tests being ordered have been discussed with the patient* who indicate understanding and agree with the plan and Code Status.   Time Spent on Admission:  65 minutes  [facility] Pager: [phone] 11/17/2013, 2:40 PM  If 7PM-7AM, please contact night-coverage  [email or web address]  Password [email or web address]

## 2013-11-17 NOTE — ED Notes (Signed)
Per EMS pt coming from [facility], complaining of weakness/dizziness/shortness of breath/abdominal pain x 1 week, Pt has also had increased edema in lower extremities, hx of CHF, legs are weeping.

## 2013-11-17 NOTE — Progress Notes (Signed)
Home health choices provided to pt   [facility] that are Medicare-Certified and are affiliated with [facility]  Telephone Number Address  [facility] has ownership interest in this company; however, you are under no obligation to use this agency. [phone] or  [place], [place] [address] [email or web address]   Agencies that are Medicare-Certified and are not affiliated with [facility] Telephone Number Address  [facility] [phone] Fax [phone] [address] [place], [place]  [address] [email or web address]  [facility] [phone] Fax [phone] [address]   [place], [place]  [address] [email or web address]  [facility] [phone] Fax [phone] [place], [place] [address] [email or web address]  [facility] [phone] Fax [phone] [address] [place], [place]  [address] [email or web address]  [facility] [phone] Fax [phone] [address] [place], [place] [address] [email or web address]  [place] [facility] [place], [place] [address] [email or web address]  [facility] [phone]  [address], [place] [address] [email or web address]  [facility] [phone] or [phone] Fax number [phone] [address], [place] [address] [place], [place]  [address] [email or web address]  [place] [phone] Fax [phone] [place], [place]  [address]  [place]  [phone] Fax [phone] [place], [place] [address] [email or web address]

## 2013-11-17 NOTE — Assessment & Plan Note (Signed)
As above per general weakness

## 2013-11-17 NOTE — Assessment & Plan Note (Signed)
x1  Recent but prob significatn approx 1 wk ago, may need PT for further ambulation

## 2013-11-17 NOTE — Assessment & Plan Note (Signed)
Exam unrevealing but does some dypsneic  With movement in chair today, to ER for further eval, states LLE has had some increased swelling recent - ? Need repeat CTA- r/o PE

## 2013-11-17 NOTE — Assessment & Plan Note (Signed)
Ongoing , chronic, no change, but at risk for UTI

## 2013-11-17 NOTE — Assessment & Plan Note (Signed)
Change in sputum color ? Related to dehydration but also with feverish and ongoing cough - ? Bronchitis vs other, does not seem to have URI, though has chronic allergy symptoms

## 2013-11-17 NOTE — Assessment & Plan Note (Signed)
Unclear etiology, but unable to care for herself adequately at this time, will need ER eval

## 2013-11-17 NOTE — ED Notes (Signed)
Bed: WA17 Expected date:  Expected time:  Means of arrival:  Comments: EMS 

## 2013-11-17 NOTE — Progress Notes (Signed)
ANTIBIOTIC CONSULT NOTE - INITIAL  Pharmacy Consult for Vancomycin & Levaquin Indication: Cellulitis & suspected UTI  Allergies  Allergen Reactions  . Oxycodone Hcl Nausea And Vomiting  . Penicillins Itching   Patient Measurements: Height: 5\' 11"  (180.3 cm) Weight: 331 lb 12.7 oz (150.5 kg) IBW/kg (Calculated) : 70.8  Vital Signs: Temp: 99.5 F (37.5 C) (08/14 1348) Temp src: Oral (08/14 1348) BP: 143/80 mmHg (08/14 1348) Pulse Rate: 89 (08/14 1348) Intake/Output from previous day:   Intake/Output from this shift:    Labs:  Recent Labs  11/17/13 1036  WBC 19.6*  HGB 11.1*  PLT 356  CREATININE 1.00   Estimated Creatinine Clearance: 88.5 ml/min (by C-G formula based on Cr of 1). No results found for this basename: VANCOTROUGH, VANCOPEAK, VANCORANDOM, GENTTROUGH, GENTPEAK, GENTRANDOM, TOBRATROUGH, TOBRAPEAK, TOBRARND, AMIKACINPEAK, AMIKACINTROU, AMIKACIN,  in the last 72 hours   Microbiology: No results found for this or any previous visit (from the past 720 hour(s)).  Medical History: Past Medical History  Diagnosis Date  . Edema   . Palpitations   . History of diastolic dysfunction     Echo 10/5641 with diastolic dysfunction  . Arthritis   . OA (osteoarthritis)   . PAC (premature atrial contraction)     per prior Holter  . PVC's (premature ventricular contractions)     per prior Holter  . Morbid obesity   . HTN (hypertension)   . Tachycardia     noted at 07/28/11 visit. Started on beta blocker. Possible atrial flutter vs long PR tachycardia/AVNRT  . SVT (supraventricular tachycardia)   . Aortic stenosis, mild 11/17/2013  . CHF (congestive heart failure)    Medications:  Scheduled:  . [START ON 11/18/2013] aspirin EC  81 mg Oral Q breakfast  . furosemide  20 mg Intravenous BID  . heparin  5,000 Units Subcutaneous 3 times per day  . insulin aspart  0-15 Units Subcutaneous TID WC  . metoprolol  100 mg Oral BID  . potassium chloride  40 mEq Oral Q4H  .  [START ON 11/18/2013] potassium chloride  40 mEq Oral Daily  . sodium chloride  3 mL Intravenous Q12H   Anti-infectives   Start     Dose/Rate Route Frequency Ordered Stop   11/17/13 1245  cefTRIAXone (ROCEPHIN) 1 g in dextrose 5 % 50 mL IVPB     1 g 100 mL/hr over 30 Minutes Intravenous  Once 11/17/13 1236 11/17/13 1314     Assessment: 34 yoF, morbidly obese with LLE cellulitis, abnormal UA and leukocytosis. Probable CHF exacerbation with SHOB.   Received one dose Rocephin 1gm in ED  Vancomycin per pharmacy for cellulitis, Levaquin for presumed UTI  Goal of Therapy:  Vancomycin trough level 10-15 mcg/ml  Plan:   Vancomycin 2500mg  x1, then 1500mg  IV q12  Levaquin 750mg  q24hr  Follow cultures, narrow abx as appropriate, abx to po as appropriate  Minda Ditto PharmD Pager [phone] 11/17/2013, 3:36 PM

## 2013-11-17 NOTE — ED Notes (Signed)
Pt states she fell a week ago landing on buttox, states having R sided flank/back pain since then, also states having increased weakness, n/v and leg swelling.

## 2013-11-17 NOTE — Patient Instructions (Signed)
With your mutiple problems and weakness today, we need to have you go to ER at [facility] for further evaluation

## 2013-11-17 NOTE — Progress Notes (Signed)
Pre visit review using our clinic review tool, if applicable. No additional management support is needed unless otherwise documented below in the visit note. 

## 2013-11-17 NOTE — Assessment & Plan Note (Signed)
Postural it seems, has chronic edema, but suspect some intravasc depletion/ dehydration, should likely have judicious IVF's

## 2013-11-17 NOTE — ED Notes (Signed)
Patient is in [place], will collect urine sample when patient returns.

## 2013-11-17 NOTE — Assessment & Plan Note (Signed)
With recent fall, may need imaging to r/o fx
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
## 2013-11-17 NOTE — ED Provider Notes (Signed)
CSN: [id number]     Arrival date & time 11/17/13  1001 History   First MD Initiated Contact with Patient 11/17/13 1008     Chief Complaint  Patient presents with  . Weakness  . Shortness of Breath     (Consider location/radiation/quality/duration/timing/severity/associated sxs/prior Treatment) The history is provided by the patient.  [name] [name] is a 68 y.o. female hx of diastolic dysfunction, HTN, obesity, aortic stenosis here with worsening shortness of breath, weakness. She has been progressively short of breath for the last week. Also dyspnea with minimal exertion. She had a mechanical fall about a week ago and landed on her buttock then had R hip pain and back pain afterwards. Some productive cough and generalized weakness. Also noted more swelling in bilateral lower extremities. Denies chest pain. Also had some vomiting and epigastric pain. Sent from primary care office for further evaluation.   Of note, she was seen here in April for SVT. She was converted in the ED and had CT angio that showed no PE. Last echo was in 2013 that showed diastolic dysfunction, mild AS.    Past Medical History  Diagnosis Date  . Edema   . Palpitations   . History of diastolic dysfunction     Echo 08/5972 with diastolic dysfunction  . Arthritis   . OA (osteoarthritis)   . PAC (premature atrial contraction)     per prior Holter  . PVC's (premature ventricular contractions)     per prior Holter  . Morbid obesity   . HTN (hypertension)   . Tachycardia     noted at 07/28/11 visit. Started on beta blocker. Possible atrial flutter vs long PR tachycardia/AVNRT  . SVT (supraventricular tachycardia)   . Aortic stenosis, mild 11/17/2013  . CHF (congestive heart failure)    Past Surgical History  Procedure Laterality Date  . Cesarean section    . US echocardiography  07/25/2008    EF 55-60%   Family History  Problem Relation Age of Onset  . Alzheimer's disease Mother   . Lung cancer Mother   .  COPD Father   . Diabetes Maternal Uncle   . Stroke Neg Hx    History  Substance Use Topics  . Smoking status: Former Smoker    Quit date: 11/23/1985  . Smokeless tobacco: Not on file  . Alcohol Use: No   OB History   Grav Para Term Preterm Abortions TAB SAB Ect Mult Living                 Review of Systems  Respiratory: Positive for shortness of breath.   Cardiovascular: Positive for leg swelling.  Neurological: Positive for weakness.  All other systems reviewed and are negative.     Allergies  Oxycodone hcl and Penicillins  Home Medications   Prior to Admission medications   Medication Sig Start Date End Date Taking? Authorizing Provider  acetaminophen (TYLENOL) 325 MG tablet Take 325-650 mg by mouth every 6 (six) hours as needed for moderate pain.    Yes Historical Provider, MD  aspirin EC 81 MG tablet Take 81 mg by mouth daily with breakfast.    Yes Historical Provider, MD  furosemide (LASIX) 40 MG tablet Take 40 mg by mouth daily as needed for edema.   Yes Historical Provider, MD  losartan (COZAAR) 100 MG tablet Take 100 mg by mouth daily.   Yes Historical Provider, MD  metFORMIN (GLUCOPHAGE) 500 MG tablet Take 500 mg by mouth 2 (two) times daily with a  meal.   Yes Historical Provider, MD  metoprolol (LOPRESSOR) 100 MG tablet Take 100 mg by mouth 2 (two) times daily.   Yes Historical Provider, MD  omeprazole (PRILOSEC) 20 MG capsule Take 20 mg by mouth 2 (two) times daily before a meal.   Yes Historical Provider, MD  traMADol (ULTRAM) 50 MG tablet Take by mouth every 6 (six) hours as needed.   Yes Historical Provider, MD   BP 147/72  Pulse 86  Temp(Src) 97.9 F (36.6 C) (Oral)  Resp 20  SpO2 100% Physical Exam  Nursing note and vitals reviewed. Constitutional: She is oriented to person, place, and time.  Chronically ill, uncomfortable   HENT:  Head: Normocephalic.  Mouth/Throat: Oropharynx is clear and moist.  Eyes: Conjunctivae and EOM are normal. Pupils are  equal, round, and reactive to light.  Neck: Normal range of motion. Neck supple.  Cardiovascular: Normal rate, regular rhythm and normal heart sounds.   Pulmonary/Chest:  Crackles bilateral bases. No wheezing   Abdominal: Soft.  Mild epigastric tenderness, no lower abdominal tenderness. Some edema   Musculoskeletal:  + tenderness R pelvic area, nl ROM bilateral hips. + lower lumbar tenderness. 2+ edema bilateral legs   Neurological: She is alert and oriented to person, place, and time. No cranial nerve deficit. Coordination normal.  Skin: Skin is warm and dry.  Psychiatric: She has a normal mood and affect. Her behavior is normal. Judgment and thought content normal.    ED Course  Procedures (including critical care time) Labs Review Labs Reviewed  CBC WITH DIFFERENTIAL - Abnormal; Notable for the following:    WBC 19.6 (*)    Hemoglobin 11.1 (*)    HCT 33.5 (*)    MCV 75.3 (*)    MCH 24.9 (*)    RDW 15.8 (*)    Neutrophils Relative % 87 (*)    Lymphocytes Relative 7 (*)    Neutro Abs 17.0 (*)    Monocytes Absolute 1.2 (*)    All other components within normal limits  COMPREHENSIVE METABOLIC PANEL - Abnormal; Notable for the following:    Sodium 135 (*)    Potassium 3.2 (*)    Chloride 95 (*)    Glucose, Bld 178 (*)    Total Protein 8.8 (*)    Albumin 2.7 (*)    GFR calc non Af Amer 57 (*)    GFR calc Af Amer 66 (*)    Anion gap 17 (*)    All other components within normal limits  PRO B NATRIURETIC PEPTIDE - Abnormal; Notable for the following:    Pro B Natriuretic peptide (BNP) 1875.0 (*)    All other components within normal limits  URINALYSIS, ROUTINE W REFLEX MICROSCOPIC - Abnormal; Notable for the following:    Color, Urine AMBER (*)    APPearance CLOUDY (*)    Hgb urine dipstick LARGE (*)    Bilirubin Urine SMALL (*)    Protein, ur 30 (*)    Urobilinogen, UA 2.0 (*)    Nitrite POSITIVE (*)    Leukocytes, UA MODERATE (*)    All other components within normal  limits  URINE MICROSCOPIC-ADD ON - Abnormal; Notable for the following:    Squamous Epithelial / LPF FEW (*)    Bacteria, UA MANY (*)    All other components within normal limits  CULTURE, BLOOD (ROUTINE X 2)  CULTURE, BLOOD (ROUTINE X 2)  LIPASE, BLOOD  I-STAT TROPOININ, ED  I-STAT CG4 LACTIC ACID, ED  Imaging Review Dg Chest 2 View  11/17/2013   CLINICAL DATA:  Difficulty breathing  EXAM: CHEST  2 VIEW  COMPARISON:  August 02, 2013 chest radiograph and chest CT  FINDINGS: There is no edema or consolidation. The heart size and pulmonary vascularity are normal. No adenopathy. There is degenerative change in the thoracic spine.  IMPRESSION: No edema or consolidation.   Electronically Signed   By: Lowella Grip M.D.   On: 11/17/2013 11:27   Dg Lumbar Spine Complete  11/17/2013   CLINICAL DATA:  Weakness, CVA rib back pain  EXAM: LUMBAR SPINE - COMPLETE 4+ VIEW  COMPARISON:  None.  FINDINGS: Five views of lumbar spine submitted. There is disc space flattening with anterior spurring at L4-L5 and L5-S1 level. Narrowing of neural foramina noted bilaterally at L5-S1 level. Facet degenerative changes L3-L4 and L5 level. Anterior spurring upper endplate of L4 vertebral body. Degenerative changes lower thoracic spine. No acute fracture or subluxation.  IMPRESSION: No acute fracture or subluxation. Degenerative changes as described above.   Electronically Signed   By: Lahoma Crocker M.D.   On: 11/17/2013 11:38   Dg Pelvis 1-2 Views  11/17/2013   CLINICAL DATA:  Fall.  Pelvic pain  EXAM: PELVIS - 1-2 VIEW  COMPARISON:  None.  FINDINGS: There is no evidence of pelvic fracture or diastasis. No other pelvic bone lesions are seen. Mild degenerative changes seen involving the pubic symphysis and hip joints.  IMPRESSION: No acute findings.   Electronically Signed   By: Earle Gell M.D.   On: 11/17/2013 11:35     EKG Interpretation   Date/Time:  Friday November 17 2013 10:17:07 EDT Ventricular Rate:  84 PR  Interval:  163 QRS Duration: 121 QT Interval:  432 QTC Calculation: 511 R Axis:   -44 Text Interpretation:  Sinus rhythm Probable left atrial enlargement  Nonspecific IVCD with LAD Left ventricular hypertrophy ST elevation,  consider inferior injury poor baseline, last tracing showed SVT  Confirmed  by YAO  MD, DAVID ([id number]) on 11/17/2013 10:19:55 AM      MDM   Final diagnoses:  None    [name] [name] is a 68 y.o. female here with weakness, shortness of breath, fall. Consider sepsis vs CHF vs ACS. Will get labs, UA, CXR, BNP. Will likely need admission.   12:47 PM WBC 20. UA + UTI. CXR clear. BNp slightly elevated but I held diuresis in the setting of infection. Given ceftriaxone. Will admit for UTI, sepsis.    Wandra Arthurs, MD 11/17/13 1248

## 2013-11-17 NOTE — Progress Notes (Signed)
UR completed 

## 2013-11-17 NOTE — Assessment & Plan Note (Signed)
Right side, suspect msk, cant r/o rib involvement, may need further imaging

## 2013-11-17 NOTE — Assessment & Plan Note (Signed)
Will need symptoms control and furhter eval of ability to take po over time

## 2013-11-17 NOTE — ED Notes (Signed)
Patient is attempting to provide a urine sample, patient is on the bed pan, and refuses assistance while using the bed pan, per Nurse Tech Donald Siva.

## 2013-11-18 DIAGNOSIS — R5381 Other malaise: Secondary | ICD-10-CM

## 2013-11-18 DIAGNOSIS — R5383 Other fatigue: Secondary | ICD-10-CM

## 2013-11-18 DIAGNOSIS — E1129 Type 2 diabetes mellitus with other diabetic kidney complication: Secondary | ICD-10-CM

## 2013-11-18 DIAGNOSIS — A419 Sepsis, unspecified organism: Secondary | ICD-10-CM

## 2013-11-18 LAB — CBC
HCT: 28.7 % — ABNORMAL LOW (ref 36.0–46.0)
Hemoglobin: 9.4 g/dL — ABNORMAL LOW (ref 12.0–15.0)
MCH: 25 pg — ABNORMAL LOW (ref 26.0–34.0)
MCHC: 32.8 g/dL (ref 30.0–36.0)
MCV: 76.3 fL — ABNORMAL LOW (ref 78.0–100.0)
Platelets: 363 10*3/uL (ref 150–400)
RBC: 3.76 MIL/uL — ABNORMAL LOW (ref 3.87–5.11)
RDW: 16 % — ABNORMAL HIGH (ref 11.5–15.5)
WBC: 13.9 10*3/uL — ABNORMAL HIGH (ref 4.0–10.5)

## 2013-11-18 LAB — COMPREHENSIVE METABOLIC PANEL
ALT: 34 U/L (ref 0–35)
AST: 59 U/L — ABNORMAL HIGH (ref 0–37)
Albumin: 2.3 g/dL — ABNORMAL LOW (ref 3.5–5.2)
Alkaline Phosphatase: 75 U/L (ref 39–117)
Anion gap: 13 (ref 5–15)
BUN: 24 mg/dL — ABNORMAL HIGH (ref 6–23)
CO2: 24 mEq/L (ref 19–32)
Calcium: 8.9 mg/dL (ref 8.4–10.5)
Chloride: 98 mEq/L (ref 96–112)
Creatinine, Ser: 1.47 mg/dL — ABNORMAL HIGH (ref 0.50–1.10)
GFR calc Af Amer: 41 mL/min — ABNORMAL LOW (ref >90)
GFR calc non Af Amer: 36 mL/min — ABNORMAL LOW (ref >90)
Glucose, Bld: 117 mg/dL — ABNORMAL HIGH (ref 70–99)
Potassium: 3.5 mEq/L — ABNORMAL LOW (ref 3.7–5.3)
Sodium: 135 mEq/L — ABNORMAL LOW (ref 137–147)
Total Bilirubin: 0.7 mg/dL (ref 0.3–1.2)
Total Protein: 7.9 g/dL (ref 6.0–8.3)

## 2013-11-18 LAB — TROPONIN I: Troponin I: <0.3 ng/mL (ref <0.30)

## 2013-11-18 LAB — GLUCOSE, CAPILLARY
Glucose-Capillary: 101 mg/dL — ABNORMAL HIGH (ref 70–99)
Glucose-Capillary: 135 mg/dL — ABNORMAL HIGH (ref 70–99)
Glucose-Capillary: 101 mg/dL — ABNORMAL HIGH (ref 70–99)
Glucose-Capillary: 128 mg/dL — ABNORMAL HIGH (ref 70–99)

## 2013-11-18 MED ORDER — CAPSICUM OLEORESIN 0.025 % EX CREA
TOPICAL_CREAM | Freq: Three times a day (TID) | CUTANEOUS | Status: DC | PRN
  Administered 2013-11-18: 17:00:00 via TOPICAL
  Filled 2013-11-18 (×2): qty 60

## 2013-11-18 MED ORDER — PANTOPRAZOLE SODIUM 40 MG PO TBEC
40.0000 mg | DELAYED_RELEASE_TABLET | Freq: Every day | ORAL | Status: DC
  Administered 2013-11-19 – 2013-11-23 (×5): 40 mg via ORAL
  Filled 2013-11-18 (×5): qty 1

## 2013-11-18 MED ORDER — METHOCARBAMOL 500 MG PO TABS
500.0000 mg | ORAL_TABLET | Freq: Four times a day (QID) | ORAL | Status: DC | PRN
  Administered 2013-11-18 – 2013-11-22 (×4): 500 mg via ORAL
  Filled 2013-11-18 (×5): qty 1

## 2013-11-18 MED ORDER — LEVOFLOXACIN IN D5W 750 MG/150ML IV SOLN
750.0000 mg | INTRAVENOUS | Status: DC
  Administered 2013-11-19: 750 mg via INTRAVENOUS
  Filled 2013-11-18 (×2): qty 150

## 2013-11-18 MED ORDER — ACETAMINOPHEN 500 MG PO TABS
1000.0000 mg | ORAL_TABLET | Freq: Once | ORAL | Status: AC
  Administered 2013-11-18: 1000 mg via ORAL
  Filled 2013-11-18: qty 2

## 2013-11-18 MED ORDER — VANCOMYCIN HCL 10 G IV SOLR
2000.0000 mg | INTRAVENOUS | Status: DC
  Administered 2013-11-18 – 2013-11-19 (×2): 2000 mg via INTRAVENOUS
  Filled 2013-11-18 (×4): qty 2000

## 2013-11-18 NOTE — Progress Notes (Signed)
I have reviewed the assessment and plan of care and I agree with the assessment.SRP, RN

## 2013-11-18 NOTE — Progress Notes (Signed)
Pt complained of pain, medicated with pain med. Will cont to monitor. SRP, RN

## 2013-11-18 NOTE — Progress Notes (Signed)
ANTIBIOTIC CONSULT NOTE - follow up  Pharmacy Consult for Vancomycin & Levaquin Indication: Cellulitis & suspected UTI  Allergies  Allergen Reactions  . Oxycodone Hcl Nausea And Vomiting  . Penicillins Itching   Patient Measurements: Height: 5\' 11"  (180.3 cm) Weight: 338 lb 3 oz (153.4 kg) IBW/kg (Calculated) : 70.8  Vital Signs: Temp: 98.4 F (36.9 C) (08/15 0530) Temp src: Oral (08/15 0530) BP: 120/62 mmHg (08/15 0530) Pulse Rate: 72 (08/15 0530) Intake/Output from previous day: 08/14 0701 - 08/15 0700 In: 890 [P.O.:240; IV Piggyback:650] Out: 350 [Urine:350] Intake/Output from this shift: Total I/O In: -  Out: 200 [Urine:200]  Labs:  Recent Labs  11/17/13 1036 11/18/13 0306  WBC 19.6* 13.9*  HGB 11.1* 9.4*  PLT 356 363  CREATININE 1.00 1.47*   Estimated Creatinine Clearance: 60.9 ml/min (by C-G formula based on Cr of 1.47). No results found for this basename: VANCOTROUGH, VANCOPEAK, VANCORANDOM, GENTTROUGH, GENTPEAK, GENTRANDOM, TOBRATROUGH, TOBRAPEAK, TOBRARND, AMIKACINPEAK, AMIKACINTROU, AMIKACIN,  in the last 72 hours   Microbiology: No results found for this or any previous visit (from the past 720 hour(s)).  Medical History: Past Medical History  Diagnosis Date  . Edema   . Palpitations   . History of diastolic dysfunction     Echo 04/6107 with diastolic dysfunction  . Arthritis   . OA (osteoarthritis)   . PAC (premature atrial contraction)     per prior Holter  . PVC's (premature ventricular contractions)     per prior Holter  . Morbid obesity   . HTN (hypertension)   . Tachycardia     noted at 07/28/11 visit. Started on beta blocker. Possible atrial flutter vs long PR tachycardia/AVNRT  . SVT (supraventricular tachycardia)   . Aortic stenosis, mild 11/17/2013  . CHF (congestive heart failure)    Medications:  Scheduled:  . aspirin EC  81 mg Oral Q breakfast  . heparin  5,000 Units Subcutaneous 3 times per day  . insulin aspart  0-15 Units  Subcutaneous TID WC  . [START ON 11/19/2013] levofloxacin (LEVAQUIN) IV  750 mg Intravenous Q48H  . metoprolol  100 mg Oral BID  . potassium chloride  40 mEq Oral Daily  . sodium chloride  3 mL Intravenous Q12H  . vancomycin  2,000 mg Intravenous Q24H   Anti-infectives   Start     Dose/Rate Route Frequency Ordered Stop   11/19/13 1800  levofloxacin (LEVAQUIN) IVPB 750 mg     750 mg 100 mL/hr over 90 Minutes Intravenous Every 48 hours 11/18/13 1257     11/18/13 2000  vancomycin (VANCOCIN) 2,000 mg in sodium chloride 0.9 % 500 mL IVPB     2,000 mg 250 mL/hr over 120 Minutes Intravenous Every 24 hours 11/18/13 1259     11/18/13 0400  vancomycin (VANCOCIN) 1,500 mg in sodium chloride 0.9 % 500 mL IVPB  Status:  Discontinued     1,500 mg 250 mL/hr over 120 Minutes Intravenous Every 12 hours 11/17/13 1525 11/18/13 1257   11/17/13 1800  levofloxacin (LEVAQUIN) IVPB 750 mg  Status:  Discontinued     750 mg 100 mL/hr over 90 Minutes Intravenous Every 24 hours 11/17/13 1532 11/18/13 1257   11/17/13 1600  vancomycin (VANCOCIN) 2,500 mg in sodium chloride 0.9 % 500 mL IVPB     2,500 mg 250 mL/hr over 120 Minutes Intravenous  Once 11/17/13 1525 11/17/13 1809   11/17/13 1245  cefTRIAXone (ROCEPHIN) 1 g in dextrose 5 % 50 mL IVPB  1 g 100 mL/hr over 30 Minutes Intravenous  Once 11/17/13 1236 11/17/13 1314     Assessment: 47 yoF, morbidly obese with LLE cellulitis, abnormal UA and leukocytosis. Probable CHF exacerbation with SHOB.   Received one dose Rocephin 1gm in ED  Vancomycin per pharmacy for cellulitis, Levaquin for presumed UTI  Scr bumped to 1.47, CrCl ~31mls/min (N), change dose of vanc and levaquin per renal function  Goal of Therapy:  Vancomycin trough level 10-15 mcg/ml  Plan:   Vancomycin 2000mg  IV q24h  Levaquin 750mg  q48hr  Follow renal function  Follow cultures, narrow abx as appropriate, abx to po as appropriate   Dolly Rias RPh 11/18/2013, 1:05 PM Pager  [phone]

## 2013-11-18 NOTE — Progress Notes (Signed)
[facility] PROGRESS NOTE  [name] [name] YPP:[id number] DOB: 07-10-1945 DOA: 11/17/2013 PCP: Webb Silversmith, NP  Assessment/Plan: Sepsis due to UTI and cellulitis  Patient with improvement in HR, WBC's and no longer febrile -will continue antibiotics (vanc and levaquin) -will follow blood cx's and urine cx's  Dyspnea on exertion echo demonstrating grade 2 diastolic dysfunction; mild aortic stenosis. Cardiac enzymes neg X 3 and no abnormalities on EKG for acute ischemia. -most likely deconditioning -will start low dose lasix on daily basis; but kidney function worsen after IV lasix on admisison. Will monitor and timing to start diuretic -continue low sodium diet -strict I's and O's  Diabetes mellitus  Will hold oral hypoglycemics and continue SSI while inpatient   Hypokalemia  Repleted -will monitor electrolytes.   DVT  Heparin   Code Status: Full Family Communication: no family at bedside Disposition Plan: to be determine   Consultants:  none  Procedures: 2-D echo: - Left ventricle: The cavity size was normal. There was mild concentric hypertrophy. Systolic function was normal. The estimated ejection fraction was in the range of 60% to 65%. Wall motion was normal; there were no regional wall motion abnormalities. Features are consistent with a pseudonormal left ventricular filling pattern, with concomitant abnormal relaxation and increased filling pressure (grade 2 diastolic dysfunction). - Aortic valve: There was very mild stenosis. Valve area (VTI): 1.83 cm^2. Valve area (Vmax): 1.87 cm^2. Valve area (Vmean): 2 cm^2. - Left atrium: The atrium was mildly dilated.  Antibiotics:  levaquin  Vancomycin   HPI/Subjective: Feeling sick, nauseous and with decrease appetite. Patient also complaining of pain in her lumbar area. Afebrile   Objective: Filed Vitals:   11/18/13 1412  BP: 137/60  Pulse: 56  Temp: 98.3 F (36.8 C)  Resp: 20     Intake/Output Summary (Last 24 hours) at 11/18/13 1654 Last data filed at 11/18/13 1245  Gross per 24 hour  Intake   1190 ml  Output    550 ml  Net    640 ml   Filed Weights   11/17/13 1348 11/18/13 0530  Weight: 150.5 kg (331 lb 12.7 oz) 153.4 kg (338 lb 3 oz)    Exam:   General:  Feeling sick, nauseous and with decrease appetite. Patient also complaining of pain in her lumbar area  Cardiovascular: s1 and S2, no rubs or gallops, positive SEM; LE edema with 3++ and lymphadenitic changes present  Respiratory: CTA bilaterally  Abdomen: soft, NT, ND, positive BS  Musculoskeletal:lumbar pain, bilateral Le with lymphadenitic changes; LLE with changes consistent with superimposed cellulitis  Data Reviewed: Basic Metabolic Panel:  Recent Labs Lab 11/17/13 1036 11/18/13 0306  NA 135* 135*  K 3.2* 3.5*  CL 95* 98  CO2 23 24  GLUCOSE 178* 117*  BUN 20 24*  CREATININE 1.00 1.47*  CALCIUM 9.3 8.9   Liver Function Tests:  Recent Labs Lab 11/17/13 1036 11/18/13 0306  AST 34 59*  ALT 35 34  ALKPHOS 86 75  BILITOT 1.1 0.7  PROT 8.8* 7.9  ALBUMIN 2.7* 2.3*    Recent Labs Lab 11/17/13 1036  LIPASE 22   CBC:  Recent Labs Lab 11/17/13 1036 11/18/13 0306  WBC 19.6* 13.9*  NEUTROABS 17.0*  --   HGB 11.1* 9.4*  HCT 33.5* 28.7*  MCV 75.3* 76.3*  PLT 356 363   Cardiac Enzymes:  Recent Labs Lab 11/17/13 1529 11/17/13 2056 11/18/13 0306  TROPONINI <0.30 <0.30 <0.30   BNP (last 3 results)  Recent Labs  08/02/13  1725 11/17/13 1035  PROBNP 601.2* 1875.0*   CBG:  Recent Labs Lab 11/17/13 1731 11/17/13 2105 11/18/13 0725 11/18/13 1203 11/18/13 1626  GLUCAP 119* 149* 101* 135* 101*    Recent Results (from the past 240 hour(s))  CULTURE, BLOOD (ROUTINE X 2)     Status: None   Collection Time    11/17/13 12:16 PM      Result Value Ref Range Status   Specimen Description BLOOD RIGHT ANTECUBITAL   Final   Special Requests BOTTLES DRAWN  AEROBIC AND ANAEROBIC 5CC   Final   Culture  Setup Time     Final   Value: 11/17/2013 14:17     Performed at [facility]   Culture     Final   Value:        BLOOD CULTURE RECEIVED NO GROWTH TO DATE CULTURE WILL BE HELD FOR 5 DAYS BEFORE ISSUING A FINAL NEGATIVE REPORT     Performed at [facility]   Report Status PENDING   Incomplete  CULTURE, BLOOD (ROUTINE X 2)     Status: None   Collection Time    11/17/13 12:16 PM      Result Value Ref Range Status   Specimen Description BLOOD LEFT HAND   Final   Special Requests BOTTLES DRAWN AEROBIC ONLY 3CC   Final   Culture  Setup Time     Final   Value: 11/17/2013 14:17     Performed at [facility]   Culture     Final   Value:        BLOOD CULTURE RECEIVED NO GROWTH TO DATE CULTURE WILL BE HELD FOR 5 DAYS BEFORE ISSUING A FINAL NEGATIVE REPORT     Performed at [facility]   Report Status PENDING   Incomplete     Studies: Dg Chest 2 View  11/17/2013   CLINICAL DATA:  Difficulty breathing  EXAM: CHEST  2 VIEW  COMPARISON:  August 02, 2013 chest radiograph and chest CT  FINDINGS: There is no edema or consolidation. The heart size and pulmonary vascularity are normal. No adenopathy. There is degenerative change in the thoracic spine.  IMPRESSION: No edema or consolidation.   Electronically Signed   By: Lowella Grip M.D.   On: 11/17/2013 11:27   Dg Lumbar Spine Complete  11/17/2013   CLINICAL DATA:  Weakness, CVA rib back pain  EXAM: LUMBAR SPINE - COMPLETE 4+ VIEW  COMPARISON:  None.  FINDINGS: Five views of lumbar spine submitted. There is disc space flattening with anterior spurring at L4-L5 and L5-S1 level. Narrowing of neural foramina noted bilaterally at L5-S1 level. Facet degenerative changes L3-L4 and L5 level. Anterior spurring upper endplate of L4 vertebral body. Degenerative changes lower thoracic spine. No acute fracture or subluxation.  IMPRESSION: No acute fracture or subluxation. Degenerative  changes as described above.   Electronically Signed   By: Lahoma Crocker M.D.   On: 11/17/2013 11:38   Dg Pelvis 1-2 Views  11/17/2013   CLINICAL DATA:  Fall.  Pelvic pain  EXAM: PELVIS - 1-2 VIEW  COMPARISON:  None.  FINDINGS: There is no evidence of pelvic fracture or diastasis. No other pelvic bone lesions are seen. Mild degenerative changes seen involving the pubic symphysis and hip joints.  IMPRESSION: No acute findings.   Electronically Signed   By: Earle Gell M.D.   On: 11/17/2013 11:35    Scheduled Meds: . aspirin EC  81 mg Oral Q breakfast  .  heparin  5,000 Units Subcutaneous 3 times per day  . insulin aspart  0-15 Units Subcutaneous TID WC  . [START ON 11/19/2013] levofloxacin (LEVAQUIN) IV  750 mg Intravenous Q48H  . metoprolol  100 mg Oral BID  . [START ON 11/19/2013] pantoprazole  40 mg Oral Q1200  . potassium chloride  40 mEq Oral Daily  . sodium chloride  3 mL Intravenous Q12H  . vancomycin  2,000 mg Intravenous Q24H   Continuous Infusions:   Active Problems:   Venous stasis of lower extremity   HTN (hypertension)   DM type 2 (diabetes mellitus, type 2)   UTI (lower urinary tract infection)   Sepsis   Aortic stenosis   Cellulitis of leg, left    Time spent: >30 minutes    Barton Dubois  [facility] Pager [phone]. If 7PM-7AM, please contact night-coverage at [email or web address], password [facility] 11/18/2013, 4:54 PM  LOS: 1 day

## 2013-11-19 DIAGNOSIS — I442 Atrioventricular block, complete: Secondary | ICD-10-CM | POA: Diagnosis not present

## 2013-11-19 DIAGNOSIS — I359 Nonrheumatic aortic valve disorder, unspecified: Secondary | ICD-10-CM

## 2013-11-19 DIAGNOSIS — I498 Other specified cardiac arrhythmias: Secondary | ICD-10-CM

## 2013-11-19 DIAGNOSIS — I10 Essential (primary) hypertension: Secondary | ICD-10-CM

## 2013-11-19 LAB — GLUCOSE, CAPILLARY
Glucose-Capillary: 113 mg/dL — ABNORMAL HIGH (ref 70–99)
Glucose-Capillary: 115 mg/dL — ABNORMAL HIGH (ref 70–99)
Glucose-Capillary: 86 mg/dL (ref 70–99)
Glucose-Capillary: 132 mg/dL — ABNORMAL HIGH (ref 70–99)

## 2013-11-19 LAB — MRSA PCR SCREENING: MRSA by PCR: NEGATIVE

## 2013-11-19 LAB — MAGNESIUM: Magnesium: 2.1 mg/dL (ref 1.5–2.5)

## 2013-11-19 MED ORDER — HYDRALAZINE HCL 20 MG/ML IJ SOLN: 10.0000 mg | Freq: Four times a day (QID) | INTRAMUSCULAR | Status: DC | PRN

## 2013-11-19 MED ORDER — SENNOSIDES-DOCUSATE SODIUM 8.6-50 MG PO TABS
1.0000 | ORAL_TABLET | Freq: Every evening | ORAL | Status: DC | PRN
  Administered 2013-11-19: 2 via ORAL
  Filled 2013-11-19 (×2): qty 2

## 2013-11-19 MED ORDER — POTASSIUM CHLORIDE CRYS ER 20 MEQ PO TBCR
40.0000 meq | EXTENDED_RELEASE_TABLET | Freq: Every day | ORAL | Status: DC
  Administered 2013-11-20 – 2013-11-23 (×4): 40 meq via ORAL
  Filled 2013-11-19 (×4): qty 2

## 2013-11-19 MED ORDER — FUROSEMIDE 20 MG PO TABS
20.0000 mg | ORAL_TABLET | Freq: Every day | ORAL | Status: DC
  Administered 2013-11-20: 20 mg via ORAL
  Filled 2013-11-19: qty 1

## 2013-11-19 NOTE — Progress Notes (Addendum)
[facility] PROGRESS NOTE  [name] [name] ZOX:[id number] DOB: 01/13/1946 DOA: 11/17/2013 PCP: Webb Silversmith, NP  Assessment/Plan: Sepsis due to UTI and cellulitis  -Patient with improvement in [facility] and no longer febrile -will continue current antibiotics (vanc and levaquin) -will follow blood cx's and urine cx's (no growth up to date)  Dyspnea on exertion echo demonstrating grade 2 diastolic dysfunction; mild aortic stenosis.  -Cardiac enzymes neg X 3 and no abnormalities on EKG for acute ischemia. -will start low dose lasix on daily basis to assist with overall volume control -continue low sodium diet -strict I's and O's -daily weight -no vascular congestion or Edema on CXR; no crackles or JVD on exam.  Diabetes mellitus  Will hold oral hypoglycemics and continue SSI while inpatient   Hypokalemia  Repleted -will monitor electrolytes.   Bradycardia and heart block -will hold metoprolol -EKG ordered -Will check Mg; potassium WNL -cardiology consulted and given the fact of complete heart block are recommending patient to be transferred to [facility]. EP to be involved for potential pacemaker as per cardiology discretion. -event was transient and patient reports some lightheadedness and nausea.  Physical deconditioning -PT has been consulted -will follow rec's  DVT  Heparin   Code Status: Full Family Communication: no family at bedside Disposition Plan: twill transfer to [facility] due to heart block and need of EP evaluation/possible pacemaker   Consultants:  Cardiology   Procedures: 2-D echo: - Left ventricle: The cavity size was normal. There was mild concentric hypertrophy. Systolic function was normal. The estimated ejection fraction was in the range of 60% to 65%. Wall motion was normal; there were no regional wall motion abnormalities. Features are consistent with a pseudonormal left ventricular filling pattern, with concomitant abnormal  relaxation and increased filling pressure (grade 2 diastolic dysfunction). - Aortic valve: There was very mild stenosis. Valve area (VTI): 1.83 cm^2. Valve area (Vmax): 1.87 cm^2. Valve area (Vmean): 2 cm^2. - Left atrium: The atrium was mildly dilated.  Antibiotics:  levaquin  Vancomycin   HPI/Subjective: Patient feeling better today; but still with intermittent nausea and lumbar pain. No fever. Patient around mid-day experienced episode of bradycardia down to (30 HR) and felt lightheaded and nauseous. Telemetry demonstarting second degree heart block  Objective: Filed Vitals:   11/19/13 0501  BP: 127/70  Pulse: 72  Temp: 98.8 F (37.1 C)  Resp: 16    Intake/Output Summary (Last 24 hours) at 11/19/13 1335 Last data filed at 11/19/13 0528  Gross per 24 hour  Intake    620 ml  Output    200 ml  Net    420 ml   Filed Weights   11/17/13 1348 11/18/13 0530 11/19/13 0501  Weight: 150.5 kg (331 lb 12.7 oz) 153.4 kg (338 lb 3 oz) 153.5 kg (338 lb 6.5 oz)    Exam:   General:  Patient feeling better today; but still with intermittent nausea and lumbar pain. No fever  Cardiovascular: s1 and S2, no rubs or gallops, positive SEM; LE edema with 3++ and lymphadenitic changes present  Respiratory: CTA bilaterally  Abdomen: soft, NT, ND, positive BS  Musculoskeletal:lumbar pain, bilateral Le with lymphadenitic changes; LLE with changes consistent with superimposed cellulitis, mild suppuration seen  Data Reviewed: Basic Metabolic Panel:  Recent Labs Lab 11/17/13 1036 11/18/13 0306  NA 135* 135*  K 3.2* 3.5*  CL 95* 98  CO2 23 24  GLUCOSE 178* 117*  BUN 20 24*  CREATININE 1.00 1.47*  CALCIUM 9.3 8.9  Liver Function Tests:  Recent Labs Lab 11/17/13 1036 11/18/13 0306  AST 34 59*  ALT 35 34  ALKPHOS 86 75  BILITOT 1.1 0.7  PROT 8.8* 7.9  ALBUMIN 2.7* 2.3*    Recent Labs Lab 11/17/13 1036  LIPASE 22   CBC:  Recent Labs Lab 11/17/13 1036  11/18/13 0306  WBC 19.6* 13.9*  NEUTROABS 17.0*  --   HGB 11.1* 9.4*  HCT 33.5* 28.7*  MCV 75.3* 76.3*  PLT 356 363   Cardiac Enzymes:  Recent Labs Lab 11/17/13 1529 11/17/13 2056 11/18/13 0306  TROPONINI <0.30 <0.30 <0.30   BNP (last 3 results)  Recent Labs  08/02/13 1725 11/17/13 1035  PROBNP 601.2* 1875.0*   CBG:  Recent Labs Lab 11/18/13 1203 11/18/13 1626 11/18/13 2044 11/19/13 0721 11/19/13 1201  GLUCAP 135* 101* 128* 113* 115*    Recent Results (from the past 240 hour(s))  CULTURE, BLOOD (ROUTINE X 2)     Status: None   Collection Time    11/17/13 12:16 PM      Result Value Ref Range Status   Specimen Description BLOOD RIGHT ANTECUBITAL   Final   Special Requests BOTTLES DRAWN AEROBIC AND ANAEROBIC 5CC   Final   Culture  Setup Time     Final   Value: 11/17/2013 14:17     Performed at [facility]   Culture     Final   Value:        BLOOD CULTURE RECEIVED NO GROWTH TO DATE CULTURE WILL BE HELD FOR 5 DAYS BEFORE ISSUING A FINAL NEGATIVE REPORT     Performed at [facility]   Report Status PENDING   Incomplete  CULTURE, BLOOD (ROUTINE X 2)     Status: None   Collection Time    11/17/13 12:16 PM      Result Value Ref Range Status   Specimen Description BLOOD LEFT HAND   Final   Special Requests BOTTLES DRAWN AEROBIC ONLY 3CC   Final   Culture  Setup Time     Final   Value: 11/17/2013 14:17     Performed at [facility]   Culture     Final   Value:        BLOOD CULTURE RECEIVED NO GROWTH TO DATE CULTURE WILL BE HELD FOR 5 DAYS BEFORE ISSUING A FINAL NEGATIVE REPORT     Performed at [facility]   Report Status PENDING   Incomplete     Studies: No results found.  Scheduled Meds: . aspirin EC  81 mg Oral Q breakfast  . heparin  5,000 Units Subcutaneous 3 times per day  . insulin aspart  0-15 Units Subcutaneous TID WC  . levofloxacin (LEVAQUIN) IV  750 mg Intravenous Q48H  . pantoprazole  40 mg Oral Daily  .  sodium chloride  3 mL Intravenous Q12H  . vancomycin  2,000 mg Intravenous Q24H   Continuous Infusions:   Active Problems:   Venous stasis of lower extremity   HTN (hypertension)   DM type 2 (diabetes mellitus, type 2)   UTI (lower urinary tract infection)   Sepsis   Aortic stenosis   Cellulitis of leg, left    Time spent: >30 minutes    Barton Dubois  [facility] Pager [phone]. If 7PM-7AM, please contact night-coverage at [email or web address], password [facility] 11/19/2013, 1:35 PM  LOS: 2 days

## 2013-11-19 NOTE — Consult Note (Signed)
Admit date: 11/17/2013 Referring Physician  Dr. Darrick Meigs Primary Physician  Dr. Garnette Gunner Primary Cardiologist  Dr. Martinique Reason for Consultation HPI: 68 year old female who has a past medical history of Edema; Palpitations; History of diastolic dysfunction; Arthritis; OA (osteoarthritis); PAC (premature atrial contraction); PVC's (premature ventricular contractions); Morbid obesity; HTN (hypertension); Tachycardia; SVT (supraventricular tachycardia); Aortic stenosis, mild (11/17/2013); and CHF (congestive heart failure)who presented to the hospital with worsening dyspnea on exertion, and fatigue. Patient says that she gets short of breath while walking short distances, unable to climb 1 flight of stairs. She also complains of nausea and vomiting, no fever. Denies chest pain, no syncope. No history of seizures or stroke in the past. Patient does have a history of aortic stenosis, last echocardiogram 11/17/13 showed normal LVF, diastolic dysfunction with very mild AS.  In the ED patient was found to have abnormal UA and leukocytosis c/w sepsis from UTI as well as cellulitis.. Started on IV Rocephin and then changed to Vanc and Levaquin. Chest x-ray did not show any significant abnormality. BNP mildly elevated at 1875.  She was started on lasix.  She was noted on tele to have some second degree AV block and her metoprolol was stopped. Cardiology is now asked to consult for management of CHF and heart block.    PMH:   Past Medical History  Diagnosis Date  . Edema   . Palpitations   . History of diastolic dysfunction     Echo 0/2585 with diastolic dysfunction  . Arthritis   . OA (osteoarthritis)   . PAC (premature atrial contraction)     per prior Holter  . PVC's (premature ventricular contractions)     per prior Holter  . Morbid obesity   . HTN (hypertension)   . Tachycardia     noted at 07/28/11 visit. Started on beta blocker. Possible atrial flutter vs long PR tachycardia/AVNRT  . SVT  (supraventricular tachycardia)   . Aortic stenosis, mild 11/17/2013  . CHF (congestive heart failure)      PSH:   Past Surgical History  Procedure Laterality Date  . Cesarean section    . US echocardiography  07/25/2008    EF 55-60%    Allergies:  Oxycodone hcl and Penicillins Prior to Admit Meds:   Prescriptions prior to admission  Medication Sig Dispense Refill  . acetaminophen (TYLENOL) 325 MG tablet Take 325-650 mg by mouth every 6 (six) hours as needed for moderate pain.       Marland Kitchen aspirin EC 81 MG tablet Take 81 mg by mouth daily with breakfast.       . furosemide (LASIX) 40 MG tablet Take 40 mg by mouth daily as needed for edema.      Marland Kitchen losartan (COZAAR) 100 MG tablet Take 100 mg by mouth daily.      . metFORMIN (GLUCOPHAGE) 500 MG tablet Take 500 mg by mouth 2 (two) times daily with a meal.      . metoprolol (LOPRESSOR) 100 MG tablet Take 100 mg by mouth 2 (two) times daily.      Marland Kitchen omeprazole (PRILOSEC) 20 MG capsule Take 20 mg by mouth 2 (two) times daily before a meal.      . traMADol (ULTRAM) 50 MG tablet Take by mouth every 6 (six) hours as needed.       Fam HX:    Family History  Problem Relation Age of Onset  . Alzheimer's disease Mother   . Lung cancer Mother   . COPD Father   .  Diabetes Maternal Uncle   . Stroke Neg Hx    Social HX:    History   Social History  . Marital Status: Single    Spouse Name: N/A    Number of Children: 2  . Years of Education: N/A   Occupational History  . 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 Smoking status: Former Smoker    Types: Cigarettes    Quit date: 11/23/1985  . Smokeless tobacco: Never Used  . Alcohol Use: No  . Drug Use: No  . Sexual Activity: No   Other Topics Concern  . Not on file   Social History Narrative  . No narrative on file     ROS:  All 11 ROS were addressed and are negative except what is stated in the HPI  Physical Exam: Blood pressure 127/70, pulse 72,  temperature 98.8 F (37.1 C), temperature source Oral, resp. rate 16, height 5\' 11"  (1.803 m), weight 338 lb 6.5 oz (153.5 kg), SpO2 96.00%.    General: Well developed, well nourished, in no acute distress Head: Eyes PERRLA, No xanthomas.   Normal cephalic and atramatic  Lungs:   Clear bilaterally to auscultation and percussion. Heart:   HRRR S1 S2 Pulses are 2+ & equal.            No carotid bruit. No JVD.  No abdominal bruits. No femoral bruits. Abdomen: Bowel sounds are positive, abdomen soft and non-tender without masses  Extremities:   B/L lymphadenitic changes with superimposed cellulitis Neuro: Alert and oriented X 3. Psych:  Good affect, responds appropriately    Labs:   Lab Results  Component Value Date   WBC 13.9* 11/18/2013   HGB 9.4* 11/18/2013   HCT 28.7* 11/18/2013   MCV 76.3* 11/18/2013   PLT 363 11/18/2013    Recent Labs Lab 11/18/13 0306  NA 135*  K 3.5*  CL 98  CO2 24  BUN 24*  CREATININE 1.47*  CALCIUM 8.9  PROT 7.9  BILITOT 0.7  ALKPHOS 75  ALT 34  AST 59*  GLUCOSE 117*   No results found for this basename: PTT   No results found for this basename: INR, PROTIME   Lab Results  Component Value Date   TROPONINI <0.30 11/18/2013     Lab Results  Component Value Date   CHOL 148 05/02/2012   CHOL 165 12/31/2011   CHOL 167 01/20/2011   Lab Results  Component Value Date   HDL 47.70 05/02/2012   HDL 45.90 12/31/2011   HDL 55.00 01/20/2011   Lab Results  Component Value Date   LDLCALC 90 05/02/2012   LDLCALC 105* 12/31/2011   LDLCALC 100* 01/20/2011   Lab Results  Component Value Date   TRIG 51.0 05/02/2012   TRIG 71.0 12/31/2011   TRIG 62.0 01/20/2011   Lab Results  Component Value Date   CHOLHDL 3 05/02/2012   CHOLHDL 4 12/31/2011   CHOLHDL 3 01/20/2011   No results found for this basename: LDLDIRECT      Radiology:  No results found.  EKG:  NSR with episodes of complete heart block with junctional excape  ASSESSMENT/PLAN: 1.   Transient complete heart block with junctional escape - BB stopped.  She was symptomatic with this and has had similar symptoms at home.  She was on a high dose of metoprolol at home.  Will keep her off of it for now.  Will transfer to stepdown at [facility] for  further monitoring as beta blocker washes out over next 4 hours. 2.  SOB with echo showing normal LVF and diastolic dysfunction, mild AS.  Cardiac enzymes neg x 3  She received IV lasix on admit for mildly elevated BNP with worsening of renal function.  Chest xray is clear.  Now on PO Lasix 3.  UTI with sepsis 4.  Cellulitis of LE 5.  Hypokalemia - replete 6.  HTN - would restart home dose of Losartan for BP control especially with stopping BB   Sueanne Margarita, MD  11/19/2013  2:48 PM

## 2013-11-19 NOTE — Progress Notes (Signed)
Call from CN stating pt was in  3rd degree HB.... EKG showed SB. Pt asymptomatic 148/92- 27- 18 MD on unit and aware

## 2013-11-19 NOTE — Progress Notes (Signed)
Pt tx to SD via care link for change in cardiac status. SB at time of tx. Report given to [place] at [facility].

## 2013-11-19 NOTE — Progress Notes (Signed)
Attempt to call report to Receiving unit. RN is not available. Will call back. Call placed to [facility] for transport

## 2013-11-19 NOTE — Plan of Care (Signed)
Problem: Phase I Progression Outcomes Goal: Initial discharge plan identified Outcome: Progressing Dicussed potential SNF de to deconditioning. Will ask MD for PT/OT eval order

## 2013-11-19 NOTE — Progress Notes (Signed)
Pt for tx to [facility] stepdown unit. Due to cardiac compromise related to  Tele changes complete heart block episodes. Will await bed and call  Report when bed available

## 2013-11-19 NOTE — Progress Notes (Signed)
PT Cancellation Note  Patient Details Name: [name] [name] MRN: [id number] DOB: [birth year]   Cancelled Treatment:    Reason Eval/Treat Not Completed: Patient not medically ready Pt with transient complete heart block; to transfer to step down unit t [facility] today for further monitoring;   [facility] 11/19/2013, 3:30 PM

## 2013-11-20 DIAGNOSIS — R42 Dizziness and giddiness: Secondary | ICD-10-CM

## 2013-11-20 LAB — BASIC METABOLIC PANEL
Anion gap: 12 (ref 5–15)
BUN: 17 mg/dL (ref 6–23)
CO2: 23 mEq/L (ref 19–32)
Calcium: 8.7 mg/dL (ref 8.4–10.5)
Chloride: 104 mEq/L (ref 96–112)
Creatinine, Ser: 1.48 mg/dL — ABNORMAL HIGH (ref 0.50–1.10)
GFR calc Af Amer: 41 mL/min — ABNORMAL LOW (ref >90)
GFR calc non Af Amer: 35 mL/min — ABNORMAL LOW (ref >90)
Glucose, Bld: 96 mg/dL (ref 70–99)
Potassium: 4.3 mEq/L (ref 3.7–5.3)
Sodium: 139 mEq/L (ref 137–147)

## 2013-11-20 LAB — GLUCOSE, CAPILLARY
Glucose-Capillary: 122 mg/dL — ABNORMAL HIGH (ref 70–99)
Glucose-Capillary: 94 mg/dL (ref 70–99)
Glucose-Capillary: 105 mg/dL — ABNORMAL HIGH (ref 70–99)
Glucose-Capillary: 141 mg/dL — ABNORMAL HIGH (ref 70–99)

## 2013-11-20 LAB — CBC
HCT: 27.5 % — ABNORMAL LOW (ref 36.0–46.0)
Hemoglobin: 8.7 g/dL — ABNORMAL LOW (ref 12.0–15.0)
MCH: 24.5 pg — ABNORMAL LOW (ref 26.0–34.0)
MCHC: 31.6 g/dL (ref 30.0–36.0)
MCV: 77.5 fL — ABNORMAL LOW (ref 78.0–100.0)
Platelets: 427 10*3/uL — ABNORMAL HIGH (ref 150–400)
RBC: 3.55 MIL/uL — ABNORMAL LOW (ref 3.87–5.11)
RDW: 16.2 % — ABNORMAL HIGH (ref 11.5–15.5)
WBC: 8.2 10*3/uL (ref 4.0–10.5)

## 2013-11-20 MED ORDER — MAGNESIUM HYDROXIDE 400 MG/5ML PO SUSP
30.0000 mL | Freq: Every day | ORAL | Status: DC | PRN
  Administered 2013-11-20: 30 mL via ORAL
  Filled 2013-11-20: qty 30

## 2013-11-20 MED ORDER — LEVOFLOXACIN IN D5W 750 MG/150ML IV SOLN
750.0000 mg | INTRAVENOUS | Status: DC
  Administered 2013-11-20: 750 mg via INTRAVENOUS
  Filled 2013-11-20 (×2): qty 150

## 2013-11-20 MED ORDER — SODIUM CHLORIDE 0.9 % IV BOLUS (SEPSIS)
250.0000 mL | Freq: Once | INTRAVENOUS | Status: AC
  Administered 2013-11-20: 250 mL via INTRAVENOUS

## 2013-11-20 MED ORDER — HYDROCODONE-ACETAMINOPHEN 5-325 MG PO TABS
1.0000 | ORAL_TABLET | ORAL | Status: DC | PRN
  Administered 2013-11-21: 2 via ORAL
  Filled 2013-11-20: qty 2

## 2013-11-20 MED ORDER — METOPROLOL TARTRATE 25 MG PO TABS
25.0000 mg | ORAL_TABLET | Freq: Two times a day (BID) | ORAL | Status: DC
  Administered 2013-11-20 – 2013-11-23 (×7): 25 mg via ORAL
  Filled 2013-11-20 (×9): qty 1

## 2013-11-20 MED ORDER — ONDANSETRON HCL 4 MG/2ML IJ SOLN: 4.0000 mg | INTRAMUSCULAR | Status: DC | PRN

## 2013-11-20 NOTE — Progress Notes (Signed)
[facility]  Meeghan Skipper JOA:[id number] DOB: Oct 27, 1945 DOA: 11/17/2013 PCP: Webb Silversmith, NP  Brief narrative: 68 year old female who has a past medical history of diastolic dysfunction; Morbid obesity; HTN ,SVT,mild Aortic stenosis who presented to the hospital with worsening dyspnea on exertion, and fatigue. Patient said that she gets short of breath while walking short distances, unable to climb 1 flight of stairs. She also complained of nausea and vomiting, no fever. Denied chest pain or syncope. Her last echocardiogram in October 2013 showed index valve area of 1 cm square.   In the ED patient was found to have abnormal UA and leukocytosis. Started on IV Rocephin. Chest x-ray did not show any significant abnormality.  HPI/Subjective: Alert and endorsing right flank pain- no SOB at rest- has not ambulated so unable to clarify if DOE persists.  Assessment/Plan:  Sepsis due to E Coli UTI/pyelonephritis  -WBC's trending down and no longer febrile  -will continue current antibiotics  -urine cx sensitivities pending -blood cx NGTD -add Vicodin for pain -discontinue vancomycin since suspect Escherichia coli primary source of sepsis and MRSA PCR was negative  Bradycardia and heart block  -resolved after BB held but unfortunately developed HR to 150s off BB so Cards resuming low dose -event was transient and patient reports some lightheadedness and nausea so could also explain DOE  Dyspnea on exertion  -echo demonstrated grade 2 diastolic dysfunction; mild aortic stenosis.  -Cardiac enzymes neg X 3 and no abnormalities on EKG for acute ischemia.  -was started on low dose lasix daily  -daily weight  -last CXR unremarkable  Diabetes mellitus  -current CBGs controlled -cont to hold oral hypoglycemics and continue SSI while inpatient   Hypokalemia  -Resolved  Physical deconditioning  -PT has been consulted  -will follow rec's   DVT  prophylaxis: Subcutaneous heparin Code Status: Full Family Communication: No family at bedside Disposition Plan/Expected LOS: SDU while trying to titrate BB   Consultants: Cardiology  Procedures: 2-D echocardiogram - Left ventricle: The cavity size was normal. There was mild concentric hypertrophy. Systolic function was normal. The estimated ejection fraction was in the range of 60% to 65%. Wall motion was normal; there were no regional wall motion abnormalities. Features are consistent with a pseudonormal left ventricular filling pattern, with concomitant abnormal relaxation and increased filling pressure (grade 2 diastolic dysfunction). - Aortic valve: There was very mild stenosis. Valve area (VTI): 1.83 cm^2. Valve area (Vmax): 1.87 cm^2. Valve area (Vmean): 2 cm^2. - Left atrium: The atrium was mildly dilated.  Cultures: 8/14 blood cultures x2 >>> 8/14 urine culture >>> Escherichia coli  Antibiotics: Ceftriaxone 8/14 Levaquin 8/14 >>> Vancomycin 8/14 >>>817  Objective: Blood pressure 131/70, pulse 133, temperature 97.9 F (36.6 C), temperature source Oral, resp. rate 19, height 5\' 11"  (1.803 m), weight 152.8 kg (336 lb 13.8 oz), SpO2 97.00%.  Intake/Output Summary (Last 24 hours) at 11/20/13 1859 Last data filed at 11/20/13 1708  Gross per 24 hour  Intake   1370 ml  Output    750 ml  Net    620 ml   Exam: Gen: No acute respiratory distress Chest: Clear to auscultation bilaterally without wheezes, rhonchi or crackles, room air Cardiac: Regular rate and rhythm, S1-S2, no rubs murmurs or gallops, no peripheral edema, no JVD GU; positive CVAT on right Abdomen: Soft nontender nondistended without obvious hepatosplenomegaly, no ascites Extremities: Symmetrical in appearance without cyanosis, clubbing or effusion  Scheduled Meds:  Scheduled Meds: .  aspirin EC  81 mg Oral Q breakfast  . furosemide  20 mg Oral Daily  . heparin  5,000 Units Subcutaneous 3 times per day   . insulin aspart  0-15 Units Subcutaneous TID WC  . levofloxacin (LEVAQUIN) IV  750 mg Intravenous Q24H  . metoprolol tartrate  25 mg Oral BID  . pantoprazole  40 mg Oral Daily  . potassium chloride  40 mEq Oral Daily  . sodium chloride  3 mL Intravenous Q12H    Data Reviewed: Basic Metabolic Panel:  Recent Labs Lab 11/17/13 1036 11/18/13 0306 11/19/13 1406 11/20/13 0250  NA 135* 135*  --  139  K 3.2* 3.5*  --  4.3  CL 95* 98  --  104  CO2 23 24  --  23  GLUCOSE 178* 117*  --  96  BUN 20 24*  --  17  CREATININE 1.00 1.47*  --  1.48*  CALCIUM 9.3 8.9  --  8.7  MG  --   --  2.1  --    Liver Function Tests:  Recent Labs Lab 11/17/13 1036 11/18/13 0306  AST 34 59*  ALT 35 34  ALKPHOS 86 75  BILITOT 1.1 0.7  PROT 8.8* 7.9  ALBUMIN 2.7* 2.3*    Recent Labs Lab 11/17/13 1036  LIPASE 22   CBC:  Recent Labs Lab 11/17/13 1036 11/18/13 0306 11/20/13 0250  WBC 19.6* 13.9* 8.2  NEUTROABS 17.0*  --   --   HGB 11.1* 9.4* 8.7*  HCT 33.5* 28.7* 27.5*  MCV 75.3* 76.3* 77.5*  PLT 356 363 427*   Cardiac Enzymes:  Recent Labs Lab 11/17/13 1529 11/17/13 2056 11/18/13 0306  TROPONINI <0.30 <0.30 <0.30   BNP (last 3 results)  Recent Labs  08/02/13 1725 11/17/13 1035  PROBNP 601.2* 1875.0*   CBG:  Recent Labs Lab 11/19/13 1655 11/19/13 2136 11/20/13 0834 11/20/13 1219 11/20/13 1712  GLUCAP 86 132* 122* 94 105*    Recent Results (from the past 240 hour(s))  URINE CULTURE     Status: None   Collection Time    11/17/13 11:37 AM      Result Value Ref Range Status   Specimen Description URINE, CLEAN CATCH   Final   Special Requests NONE   Final   Culture  Setup Time     Final   Value: 11/18/2013 20:03     Performed at [place]     Final   Value: >=100,000 COLONIES/ML     Performed at [facility]   Culture     Final   Value: ESCHERICHIA COLI     Performed at [facility]   Report Status PENDING    Incomplete  CULTURE, BLOOD (ROUTINE X 2)     Status: None   Collection Time    11/17/13 12:16 PM      Result Value Ref Range Status   Specimen Description BLOOD RIGHT ANTECUBITAL   Final   Special Requests BOTTLES DRAWN AEROBIC AND ANAEROBIC 5CC   Final   Culture  Setup Time     Final   Value: 11/17/2013 14:17     Performed at [facility]   Culture     Final   Value:        BLOOD CULTURE RECEIVED NO GROWTH TO DATE CULTURE WILL BE HELD FOR 5 DAYS BEFORE ISSUING A FINAL NEGATIVE REPORT     Performed at [facility]  Report Status PENDING   Incomplete  CULTURE, BLOOD (ROUTINE X 2)     Status: None   Collection Time    11/17/13 12:16 PM      Result Value Ref Range Status   Specimen Description BLOOD LEFT HAND   Final   Special Requests BOTTLES DRAWN AEROBIC ONLY 3CC   Final   Culture  Setup Time     Final   Value: 11/17/2013 14:17     Performed at [facility]   Culture     Final   Value:        BLOOD CULTURE RECEIVED NO GROWTH TO DATE CULTURE WILL BE HELD FOR 5 DAYS BEFORE ISSUING A FINAL NEGATIVE REPORT     Performed at [facility]   Report Status PENDING   Incomplete  MRSA PCR SCREENING     Status: None   Collection Time    11/19/13  4:54 PM      Result Value Ref Range Status   MRSA by PCR NEGATIVE  NEGATIVE Final   Comment:            The GeneXpert MRSA Assay (FDA     approved for NASAL specimens     only), is one component of a     comprehensive MRSA colonization     surveillance program. It is not     intended to diagnose MRSA     infection nor to guide or     monitor treatment for     MRSA infections.     Studies:  Recent x-ray studies have been reviewed in detail by the Attending Physician  Time spent :  [place], ANP [facility] Office  [phone] Pager [phone]   **If unable to reach the above provider after paging please contact the [place] @ [phone]  On-Call/Text Page:       [email or web address]      password [email or web address]  If 7PM-7AM, please contact night-coverage [email or web address] Password [email or web address] 11/20/2013, 6:59 PM   LOS: 3 days   I have personally examined this patient and reviewed the entire database. I have reviewed the above note, made any necessary editorial changes, and agree with its content.  Cherene Altes, MD [facility]

## 2013-11-20 NOTE — Progress Notes (Signed)
Patient Name: [name] [name] Date of Encounter: 11/20/2013  Active Problems:   Venous stasis of lower extremity   HTN (hypertension)   DM type 2 (diabetes mellitus, type 2)   UTI (lower urinary tract infection)   Sepsis   Aortic stenosis   Cellulitis of leg, left   Complete heart block   Length of Stay: 3  SUBJECTIVE  The patient states that she feels weak and constipated.  CURRENT MEDS . aspirin EC  81 mg Oral Q breakfast  . furosemide  20 mg Oral Daily  . heparin  5,000 Units Subcutaneous 3 times per day  . insulin aspart  0-15 Units Subcutaneous TID WC  . levofloxacin (LEVAQUIN) IV  750 mg Intravenous Q24H  . pantoprazole  40 mg Oral Daily  . potassium chloride  40 mEq Oral Daily  . sodium chloride  3 mL Intravenous Q12H  . vancomycin  2,000 mg Intravenous Q24H   OBJECTIVE  Filed Vitals:   11/20/13 0500 11/20/13 0610 11/20/13 0832 11/20/13 1216  BP:  115/80 122/78 131/76  Pulse:  68 67 131  Temp: 99.3 F (37.4 C)  98.1 F (36.7 C) 97.1 F (36.2 C)  TempSrc: Oral  Oral Oral  Resp:  17 17 14   Height:      Weight: 336 lb 13.8 oz (152.8 kg)     SpO2:  98% 92% 99%    Intake/Output Summary (Last 24 hours) at 11/20/13 1240 Last data filed at 11/20/13 0900  Gross per 24 hour  Intake   1230 ml  Output      0 ml  Net   1230 ml   Filed Weights   11/19/13 0501 11/19/13 1700 11/20/13 0500  Weight: 338 lb 6.5 oz (153.5 kg) 339 lb 8.1 oz (154 kg) 336 lb 13.8 oz (152.8 kg)    PHYSICAL EXAM  General: Pleasant, NAD. Neuro: Alert and oriented X 3. Moves all extremities spontaneously. Psych: Normal affect. HEENT:  Normal  Neck: Supple without bruits or JVD. Lungs:  Resp regular and unlabored, CTA. Heart: RRR no s3, s4, or murmurs. Abdomen: Soft, non-tender, non-distended, BS + x 4.  Extremities: No clubbing, cyanosis or edema. DP/PT/Radials 2+ and equal bilaterally.  Accessory Clinical Findings  CBC  Recent Labs  11/18/13 0306 11/20/13 0250  WBC  13.9* 8.2  HGB 9.4* 8.7*  HCT 28.7* 27.5*  MCV 76.3* 77.5*  PLT 363 144*   Basic Metabolic Panel  Recent Labs  11/18/13 0306 11/19/13 1406 11/20/13 0250  NA 135*  --  139  K 3.5*  --  4.3  CL 98  --  104  CO2 24  --  23  GLUCOSE 117*  --  96  BUN 24*  --  17  CREATININE 1.47*  --  1.48*  CALCIUM 8.9  --  8.7  MG  --  2.1  --    Liver Function Tests  Recent Labs  11/18/13 0306  AST 59*  ALT 34  ALKPHOS 75  BILITOT 0.7  PROT 7.9  ALBUMIN 2.3*   Cardiac Enzymes  Recent Labs  11/17/13 1529 11/17/13 2056 11/18/13 0306  TROPONINI <0.30 <0.30 <0.30   Radiology/Studies  Dg Chest 2 View  11/17/2013   CLINICAL DATA:  Difficulty breathing  EXAM: CHEST  2 VIEW  COMPARISON:  August 02, 2013 chest radiograph and chest CT  FINDINGS: There is no edema or consolidation. The heart size and pulmonary vascularity are normal. No adenopathy. There is degenerative change in  the thoracic spine.  IMPRESSION: No edema or consolidation.   Electronically Signed   By: Lowella Grip M.D.   On: 11/17/2013 11:27   Dg Lumbar Spine Complete  11/17/2013   CLINICAL DATA:  Weakness, CVA rib back pain  EXAM: LUMBAR SPINE - COMPLETE 4+ VIEW  COMPARISON:  None.  FINDINGS: Five views of lumbar spine submitted. There is disc space flattening with anterior spurring at L4-L5 and L5-S1 level. Narrowing of neural foramina noted bilaterally at L5-S1 level. Facet degenerative changes L3-L4 and L5 level. Anterior spurring upper endplate of L4 vertebral body. Degenerative changes lower thoracic spine. No acute fracture or subluxation.  IMPRESSION: No acute fracture or subluxation. Degenerative changes as described above.     TELE: SR 65 BPM --> sinus tachycardia 120 BPM    ASSESSMENT AND PLAN  1. Transient complete heart block with junctional escape - BB stopped. She was symptomatic with this and has had similar symptoms at home. She was on a high dose of metoprolol at home. Bradycardia has resolved and  she is now tachycardic up to 150 BPM, seems like sinus tachy, we will perform 12 lead ECG to evaluate. We will restart low dose 25 mg po BID.  2. SOB with echo showing normal LVF and diastolic dysfunction, mild AS. Cardiac enzymes neg x 3 She received IV lasix on admit for mildly elevated BNP with worsening of renal function. Chest xray is clear. Now on PO Lasix   3. UTI with sepsis   4. Cellulitis of LE   5. HTN - controlled  6. Constipation - we will order milk of magnesia  Signed, Dorothy Spark MD, [facility] 11/20/2013

## 2013-11-20 NOTE — Clinical Documentation Improvement (Signed)
  Patient admitted with Sepsis, UTI, and Cellulitis.  Creatinine on admission - 1.00.  Creatinine now 1.47 and 1.48.  Also treated with IV Lasix 20 mg IV bid on admission now changed to 20 mg po daily.  Possible Clinical Conditions:   - Acute Kidney Injury 2/2 ................   - Other Condition   - Unable to Clinically Determine  Thank You, Erling Conte ,RN Clinical Documentation Specialist:  [phone]   [place] Information Management

## 2013-11-20 NOTE — Evaluation (Signed)
Physical Therapy Evaluation Patient Details Name: [name] [name] MRN: [id number] DOB: [birth year] Today's Date: 11/20/2013   History of Present Illness  Pt is a 68 y/o female admitted with worsening DOE, fatigue, nausea and vomiting. Patient has a history of aortic stenosis. In the ED patient was found to have abnormal UA and leukocytosis consistent with sepsis from UTI as well as cellulitis. She was noted on tele to have some second degree AV block.  Clinical Impression  Pt admitted with the above. Pt currently with functional limitations due to the deficits listed below (see PT Problem List). At the time of PT eval pt was able to perform minimal functional mobility due to fatigue. Pt concerned about level of independence returning home with daughter, as she will be alone during the day. Pt will benefit from skilled PT to increase their independence and safety with mobility to allow discharge to the venue listed below. Feel pt is a good CIR candidate as she was independent PTA and is motivated to participate.       Follow Up Recommendations CIR    Equipment Recommendations  None recommended by PT    Recommendations for Other Services Rehab consult     Precautions / Restrictions Precautions Precautions: Fall Precaution Comments: Pt with poor safety awareness Restrictions Weight Bearing Restrictions: No      Mobility  Bed Mobility Overal bed mobility: Needs Assistance Bed Mobility: Supine to Sit;Sit to Supine     Supine to sit: Min guard Sit to supine: Min guard   General bed mobility comments: Pt requires increased time and cueing for bed rail use for support. She was able to transition to full sitting EOB without physical assist, only close guarding.   Transfers Overall transfer level: Needs assistance Equipment used: Rolling walker (2 wheeled) Transfers: Sit to/from [facility] Sit to Stand: Min assist Stand pivot transfers: Min assist       General  transfer comment: Pt with decreased safety awareness and is pulling up to stand from walker despite cues to push up from the bed. Pt rocking and uses momentum to power-up to standing. During SPT pt letting go of walker to reach for [facility] and has decreased awareness of lines.  Ambulation/Gait Ambulation/Gait assistance: Min assist Ambulation Distance (Feet): 15 Feet Assistive device: Rolling walker (2 wheeled) Gait Pattern/deviations: Step-to pattern;Decreased stride length;Trendelenburg;Trunk flexed Gait velocity: Decreased Gait velocity interpretation: Below normal speed for age/gender General Gait Details: Pt again demonstrates poor safety awareness and leans her forearms on the walker. Therapist cueing pt for improved posture and explains that pt is safer with hands on the walker, however pt makes no corrective changes, stating her back is hurting and she cannot stand up straight.   Stairs            Wheelchair Mobility    Modified Rankin (Stroke Patients Only)       Balance Overall balance assessment: Needs assistance Sitting-balance support: Feet supported;No upper extremity supported Sitting balance-Leahy Scale: Fair     Standing balance support: Bilateral upper extremity supported;During functional activity Standing balance-Leahy Scale: Poor                               Pertinent Vitals/Pain      Home Living Family/patient expects to be discharged to:: Private residence Living Arrangements: Children Available Help at Discharge: Family;Friend(s);Available PRN/intermittently Type of Home: Apartment Home Access: Stairs to enter Entrance Stairs-Rails: Right;Left;Can reach both Entrance  Stairs-Number of Steps: 5 Home Layout: One level Home Equipment: Fieldon - 2 wheels;Cane - single point      Prior Function Level of Independence: Independent with assistive device(s)         Comments: Does not drive anymore. Friends assist with grocery shopping.       Hand Dominance   Dominant Hand: Right    Extremity/Trunk Assessment   Upper Extremity Assessment: Defer to OT evaluation           Lower Extremity Assessment: Generalized weakness      Cervical / Trunk Assessment: Normal  Communication   Communication: No difficulties  Cognition Arousal/Alertness: Awake/alert Behavior During Therapy: WFL for tasks assessed/performed Overall Cognitive Status: Within Functional Limits for tasks assessed                      General Comments      Exercises        Assessment/Plan    PT Assessment Patient needs continued PT services  PT Diagnosis Difficulty walking;Generalized weakness   PT Problem List Decreased strength;Decreased range of motion;Decreased balance;Decreased mobility;Decreased activity tolerance;Decreased knowledge of use of DME;Decreased safety awareness;Decreased knowledge of precautions  PT Treatment Interventions DME instruction;Gait training;Functional mobility training;Stair training;Therapeutic activities;Therapeutic exercise;Neuromuscular re-education;Patient/family education   PT Goals (Current goals can be found in the Care Plan section) Acute Rehab PT Goals Patient Stated Goal: To return home independently PT Goal Formulation: With patient Time For Goal Achievement: 11/27/13 Potential to Achieve Goals: Good    Frequency Min 3X/week   Barriers to discharge Decreased caregiver support      Co-evaluation               End of Session Equipment Utilized During Treatment: Gait belt Activity Tolerance: Patient limited by fatigue Patient left: in bed;with call bell/phone within reach Nurse Communication: Mobility status         Time: 5364-6803 PT Time Calculation (min): 25 min   Charges:   PT Evaluation $Initial PT Evaluation Tier I: 1 Procedure PT Treatments $Therapeutic Activity: 8-22 mins   PT G CodesJolyn [name] 11/20/2013, 4:54 PM  [name] [name], PT,  DPT Acute Rehabilitation Services Pager: [phone]

## 2013-11-20 NOTE — Progress Notes (Signed)
ANTIBIOTIC CONSULT NOTE - FOLLOW UP  Pharmacy Consult for levaquin, vancomycin Indication: sepsis due to UTI and cellulitis  Allergies  Allergen Reactions  . Oxycodone Hcl Nausea And Vomiting  . Penicillins Itching    Patient Measurements: Height: 5\' 11"  (180.3 cm) Weight: 336 lb 13.8 oz (152.8 kg) IBW/kg (Calculated) : 70.8  Vital Signs: Temp: 98.1 F (36.7 C) (08/17 0832) Temp src: Oral (08/17 0832) BP: 122/78 mmHg (08/17 0832) Pulse Rate: 67 (08/17 0832) Intake/Output from previous day: 08/16 0701 - 08/17 0700 In: 1230 [P.O.:580; IV Piggyback:650] Out: -  Intake/Output from this shift: Total I/O In: 120 [P.O.:120] Out: -   Labs:  Recent Labs  11/17/13 1036 11/18/13 0306 11/20/13 0250  WBC 19.6* 13.9* 8.2  HGB 11.1* 9.4* 8.7*  PLT 356 363 427*  CREATININE 1.00 1.47* 1.48*   Estimated Creatinine Clearance: 60.3 ml/min (by C-G formula based on Cr of 1.48). No results found for this basename: VANCOTROUGH, Corlis Leak, VANCORANDOM, [place], GENTPEAK, GENTRANDOM, TOBRATROUGH, TOBRAPEAK, TOBRARND, AMIKACINPEAK, AMIKACINTROU, AMIKACIN,  in the last 72 hours   Microbiology: Recent Results (from the past 720 hour(s))  URINE CULTURE     Status: None   Collection Time    11/17/13 11:37 AM      Result Value Ref Range Status   Specimen Description URINE, CLEAN CATCH   Final   Special Requests NONE   Final   Culture  Setup Time     Final   Value: 11/18/2013 20:03     Performed at [facility]     Final   Value: >=100,000 COLONIES/ML     Performed at [facility]   Culture     Final   Value: ESCHERICHIA COLI     Performed at [facility]   Report Status PENDING   Incomplete  CULTURE, BLOOD (ROUTINE X 2)     Status: None   Collection Time    11/17/13 12:16 PM      Result Value Ref Range Status   Specimen Description BLOOD RIGHT ANTECUBITAL   Final   Special Requests BOTTLES DRAWN AEROBIC AND ANAEROBIC 5CC   Final   Culture  Setup Time     Final   Value: 11/17/2013 14:17     Performed at [facility]   Culture     Final   Value:        BLOOD CULTURE RECEIVED NO GROWTH TO DATE CULTURE WILL BE HELD FOR 5 DAYS BEFORE ISSUING A FINAL NEGATIVE REPORT     Performed at [facility]   Report Status PENDING   Incomplete  CULTURE, BLOOD (ROUTINE X 2)     Status: None   Collection Time    11/17/13 12:16 PM      Result Value Ref Range Status   Specimen Description BLOOD LEFT HAND   Final   Special Requests BOTTLES DRAWN AEROBIC ONLY 3CC   Final   Culture  Setup Time     Final   Value: 11/17/2013 14:17     Performed at [facility]   Culture     Final   Value:        BLOOD CULTURE RECEIVED NO GROWTH TO DATE CULTURE WILL BE HELD FOR 5 DAYS BEFORE ISSUING A FINAL NEGATIVE REPORT     Performed at [facility]   Report Status PENDING   Incomplete  MRSA PCR SCREENING     Status: None   Collection Time  11/19/13  4:54 PM      Result Value Ref Range Status   MRSA by PCR NEGATIVE  NEGATIVE Final   Comment:            The GeneXpert MRSA Assay (FDA     approved for NASAL specimens     only), is one component of a     comprehensive MRSA colonization     surveillance program. It is not     intended to diagnose MRSA     infection nor to guide or     monitor treatment for     MRSA infections.    Anti-infectives   Start     Dose/Rate Route Frequency Ordered Stop   11/19/13 1800  levofloxacin (LEVAQUIN) IVPB 750 mg     750 mg 100 mL/hr over 90 Minutes Intravenous Every 48 hours 11/18/13 1257     11/18/13 2000  vancomycin (VANCOCIN) 2,000 mg in sodium chloride 0.9 % 500 mL IVPB     2,000 mg 250 mL/hr over 120 Minutes Intravenous Every 24 hours 11/18/13 1259     11/18/13 0400  vancomycin (VANCOCIN) 1,500 mg in sodium chloride 0.9 % 500 mL IVPB  Status:  Discontinued     1,500 mg 250 mL/hr over 120 Minutes Intravenous Every 12 hours 11/17/13 1525 11/18/13 1257   11/17/13 1800   levofloxacin (LEVAQUIN) IVPB 750 mg  Status:  Discontinued     750 mg 100 mL/hr over 90 Minutes Intravenous Every 24 hours 11/17/13 1532 11/18/13 1257   11/17/13 1600  vancomycin (VANCOCIN) 2,500 mg in sodium chloride 0.9 % 500 mL IVPB     2,500 mg 250 mL/hr over 120 Minutes Intravenous  Once 11/17/13 1525 11/17/13 1809   11/17/13 1245  cefTRIAXone (ROCEPHIN) 1 g in dextrose 5 % 50 mL IVPB     1 g 100 mL/hr over 30 Minutes Intravenous  Once 11/17/13 1236 11/17/13 1314      Assessment: 68 yo female on vancomycin and levaquin for sepsis due to UTI and cellulitis.  WBC= 8.2, afebrile, SCr= 1.48 and CrCl ~ 60.  Urine cultures show e. Coli.  8/14 >> Rocephin x1 >> 8/14 >> Vancomycin >>  8/14 >> Levaquin >>  8/14 blood: ngtd 8/14 urine: e coli 100K  Goal of Therapy:  Vancomycin trough level 15-20 mcg/ml  Plan:  -Change levaquin to 750mg  IV q24hr -Consider d/c vancomycin -Will follow renal function and culture results  Hildred Laser, Pharm D 11/20/2013 10:23 AM

## 2013-11-20 NOTE — Care Management Note (Addendum)
    Page 1 of 1   11/23/2013     3:38:01 PM CARE MANAGEMENT NOTE 11/23/2013  Patient:  [name] [name], [name] [name]   Account Number:  [id number]  Date Initiated:  11/17/2013  Documentation initiated by:  Gabriel Earing  Subjective/Objective Assessment:   pt admits from MD's office weakness/dizziness/shortness of breath/abdominal pain x 1 week, Pt has also had increased edema in lower extremities, hx of CHF, legs are weeping     Action/Plan:   from home   Anticipated DC Date:  11/23/2013   Anticipated DC Plan:  [place]  CM consult      Choice offered to / List presented to:             Status of service:  Completed, signed off Medicare Important Message given?  YES (If response is "NO", the following Medicare IM given date fields will be blank) Date Medicare IM given:  11/20/2013 Medicare IM given by:  Elissa Hefty Date Additional Medicare IM given:  11/22/2013 Additional Medicare IM given by:  Nils Thor  Discharge Disposition:  IP REHAB FACILITY  Per UR Regulation:  Reviewed for med. necessity/level of care/duration of stay  If discussed at [place] of Stay Meetings, dates discussed:   11/23/2013    Comments:  11/23/13 Ellan Lambert, RN, BSN [phone] DC to IP rehab held due to cardiac issue. Pt will dc today to CIR.  11/22/13 Ellan Lambert, RN, BSN [phone] Pt discharging to [facility] IP rehab today.  11/17/13 MMcGibboney, RN, BSn Chart reviewd.

## 2013-11-21 DIAGNOSIS — N390 Urinary tract infection, site not specified: Secondary | ICD-10-CM

## 2013-11-21 DIAGNOSIS — R5381 Other malaise: Secondary | ICD-10-CM

## 2013-11-21 DIAGNOSIS — E119 Type 2 diabetes mellitus without complications: Secondary | ICD-10-CM

## 2013-11-21 DIAGNOSIS — A4151 Sepsis due to Escherichia coli [E. coli]: Principal | ICD-10-CM

## 2013-11-21 DIAGNOSIS — M47817 Spondylosis without myelopathy or radiculopathy, lumbosacral region: Secondary | ICD-10-CM

## 2013-11-21 DIAGNOSIS — N10 Acute tubulo-interstitial nephritis: Secondary | ICD-10-CM

## 2013-11-21 LAB — BASIC METABOLIC PANEL
Anion gap: 13 (ref 5–15)
BUN: 17 mg/dL (ref 6–23)
CO2: 21 mEq/L (ref 19–32)
Calcium: 8.5 mg/dL (ref 8.4–10.5)
Chloride: 105 mEq/L (ref 96–112)
Creatinine, Ser: 1.63 mg/dL — ABNORMAL HIGH (ref 0.50–1.10)
GFR calc Af Amer: 37 mL/min — ABNORMAL LOW (ref >90)
GFR calc non Af Amer: 32 mL/min — ABNORMAL LOW (ref >90)
Glucose, Bld: 98 mg/dL (ref 70–99)
Potassium: 4.8 mEq/L (ref 3.7–5.3)
Sodium: 139 mEq/L (ref 137–147)

## 2013-11-21 LAB — URINE CULTURE: Colony Count: >=100000

## 2013-11-21 LAB — GLUCOSE, CAPILLARY
Glucose-Capillary: 100 mg/dL — ABNORMAL HIGH (ref 70–99)
Glucose-Capillary: 128 mg/dL — ABNORMAL HIGH (ref 70–99)
Glucose-Capillary: 100 mg/dL — ABNORMAL HIGH (ref 70–99)
Glucose-Capillary: 112 mg/dL — ABNORMAL HIGH (ref 70–99)

## 2013-11-21 NOTE — Consult Note (Signed)
Physical Medicine and Rehabilitation Consult  Reason for Consult: Deconditioning due to sepsis Referring Physician: Dr. Thereasa Solo   HPI: [name] [name] is a 68 y.o. female with history of CHF, morbid obesity, SVT, PAC, PVS's, mild AS who was admitted on 11/17/13 with SOB, weeping edema BLE,  fatigue as well as nausea and vomiting. She was treated for sepsis as work up with leucocytosis due to E coli UTI as well as BLE cellulitis.  2D echo with EF  60-655 with grade 2 diastolic dysfunction. Pro BNP-1875 and CXR without edema. She was treated with IV diuretics but has had worsening of renal status. She developed 2nd degree heart block and metoprolol d/c. Dr. Radford Pax was consulted for input and recommended restarting Lorsartan as well as cardiac monitoring till BB washed out. Cardiac enzymes X 3 negative. Heart block has resolved with tachycardia and low dose metoprolol resumed. PT evaluation done yesterday and patient noted to be deconditioned and limited due to back paina s well as poor posture. MD, PT recommending CIR.    Review of Systems  Constitutional: Positive for malaise/fatigue.  Eyes: Positive for blurred vision (due to cataracts).  Respiratory: Positive for shortness of breath (chronic).   Cardiovascular: Negative for chest pain and palpitations.  Musculoskeletal: Positive for back pain, joint pain, myalgias and neck pain.  Neurological: Positive for dizziness (with postional changes occasionally. ) and weakness.  Psychiatric/Behavioral: The patient does not have insomnia.     Past Medical History  Diagnosis Date  . Edema   . Palpitations   . History of diastolic dysfunction     Echo 08/6312 with diastolic dysfunction  . Arthritis   . OA (osteoarthritis)   . PAC (premature atrial contraction)     per prior Holter  . PVC's (premature ventricular contractions)     per prior Holter  . Morbid obesity   . HTN (hypertension)   . Tachycardia     noted at 07/28/11 visit.  Started on beta blocker. Possible atrial flutter vs long PR tachycardia/AVNRT  . SVT (supraventricular tachycardia)   . Aortic stenosis, mild 11/17/2013  . CHF (congestive heart failure)     Past Surgical History  Procedure Laterality Date  . Cesarean section    . US echocardiography  07/25/2008    EF 55-60%    Family History  Problem Relation Age of Onset  . Alzheimer's disease Mother   . Lung cancer Mother   . COPD Father   . Diabetes Maternal Uncle   . Stroke Neg Hx     Social History:  Lives with family--daughter works.  Independent with cane PTA. 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 She reports that she quit smoking about 28 years ago. Her smoking use included Cigarettes. She smoked 0.00 packs per day. She has never used smokeless tobacco. She reports that she does not drink alcohol or use illicit drugs.    Allergies  Allergen Reactions  . Oxycodone Hcl Nausea And Vomiting  . Penicillins Itching    Medications Prior to Admission  Medication Sig Dispense Refill  . acetaminophen (TYLENOL) 325 MG tablet Take 325-650 mg by mouth every 6 (six) hours as needed for moderate pain.       Marland Kitchen aspirin EC 81 MG tablet Take 81 mg by mouth daily with breakfast.       . furosemide (LASIX) 40 MG tablet Take 40 mg by mouth daily as needed for edema.      Marland Kitchen losartan (  COZAAR) 100 MG tablet Take 100 mg by mouth daily.      . metFORMIN (GLUCOPHAGE) 500 MG tablet Take 500 mg by mouth 2 (two) times daily with a meal.      . metoprolol (LOPRESSOR) 100 MG tablet Take 100 mg by mouth 2 (two) times daily.      Marland Kitchen omeprazole (PRILOSEC) 20 MG capsule Take 20 mg by mouth 2 (two) times daily before a meal.      . traMADol (ULTRAM) 50 MG tablet Take by mouth every 6 (six) hours as needed.        Home: Home Living Family/patient expects to be discharged to:: Private residence Living Arrangements: Children Available Help at Discharge: Family;Friend(s);Available  PRN/intermittently Type of Home: Apartment Home Access: Stairs to enter Entrance Stairs-Number of Steps: 5 Entrance Stairs-Rails: Right;Left;Can reach both Home Layout: One level Home Equipment: Walker - 2 wheels;Cane - single point  Functional History: Prior Function Level of Independence: Independent with assistive device(s) Comments: Does not drive anymore. Friends assist with grocery shopping.  Functional Status:  Mobility: Bed Mobility Overal bed mobility: Needs Assistance Bed Mobility: Supine to Sit;Sit to Supine Supine to sit: Min guard Sit to supine: Min guard General bed mobility comments: Pt requires increased time and cueing for bed rail use for support. She was able to transition to full sitting EOB without physical assist, only close guarding.  Transfers Overall transfer level: Needs assistance Equipment used: Rolling walker (2 wheeled) Transfers: Sit to/from [facility] Sit to Stand: Min assist Stand pivot transfers: Min assist General transfer comment: Pt with decreased safety awareness and is pulling up to stand from walker despite cues to push up from the bed. Pt rocking and uses momentum to power-up to standing. During SPT pt letting go of walker to reach for [facility] and has decreased awareness of lines. Ambulation/Gait Ambulation/Gait assistance: Min assist Ambulation Distance (Feet): 15 Feet Assistive device: Rolling walker (2 wheeled) Gait Pattern/deviations: Step-to pattern;Decreased stride length;Trendelenburg;Trunk flexed Gait velocity: Decreased Gait velocity interpretation: Below normal speed for age/gender General Gait Details: Pt again demonstrates poor safety awareness and leans her forearms on the walker. Therapist cueing pt for improved posture and explains that pt is safer with hands on the walker, however pt makes no corrective changes, stating her back is hurting and she cannot stand up straight.     ADL:     Cognition: Cognition Overall Cognitive Status: Within Functional Limits for tasks assessed Orientation Level: Oriented X4 Cognition Arousal/Alertness: Awake/alert Behavior During Therapy: WFL for tasks assessed/performed Overall Cognitive Status: Within Functional Limits for tasks assessed  Blood pressure 109/52, pulse 70, temperature 98.9 F (37.2 C), temperature source Oral, resp. rate 17, height 5\' 11"  (1.803 m), weight 153.3 kg (337 lb 15.4 oz), SpO2 95.00%. Physical Exam  Nursing note and vitals reviewed. Constitutional: She is oriented to person, place, and time. She appears well-developed and well-nourished.  Morbidly obese  HENT:  Head: Normocephalic and atraumatic.  Eyes: Conjunctivae are normal. Pupils are equal, round, and reactive to light.  Neck: Normal range of motion. Neck supple.  Cardiovascular: Normal rate and regular rhythm.   Murmur heard. Respiratory: Effort normal and breath sounds normal. No respiratory distress. She has no wheezes.  GI: Soft. Bowel sounds are normal. She exhibits no distension. There is no tenderness.  Musculoskeletal:  Lymphedema BLE --L>R with resolving erythema left shin. Ichthyosis bilateral shins. LUE with limitations at shoulder due to RTC pathology.   Neurological: She is alert and oriented to  person, place, and time.  UES: 5/5 prox to distal. LES: 3+ HF, 4 KE and 4/5 ankles bilaterally. No gross sensory deficits.   Skin: Skin is warm and dry.  Psychiatric: Her behavior is normal. Judgment and thought content normal. Her affect is blunt.    Results for orders placed during the hospital encounter of 11/17/13 (from the past 24 hour(s))  GLUCOSE, CAPILLARY     Status: Abnormal   Collection Time    11/20/13  8:34 AM      Result Value Ref Range   Glucose-Capillary 122 (*) 70 - 99 mg/dL  GLUCOSE, CAPILLARY     Status: None   Collection Time    11/20/13 12:19 PM      Result Value Ref Range   Glucose-Capillary 94  70 - 99 mg/dL   GLUCOSE, CAPILLARY     Status: Abnormal   Collection Time    11/20/13  5:12 PM      Result Value Ref Range   Glucose-Capillary 105 (*) 70 - 99 mg/dL  GLUCOSE, CAPILLARY     Status: Abnormal   Collection Time    11/20/13  9:18 PM      Result Value Ref Range   Glucose-Capillary 141 (*) 70 - 99 mg/dL  BASIC METABOLIC PANEL     Status: Abnormal   Collection Time    11/21/13  3:03 AM      Result Value Ref Range   Sodium 139  137 - 147 mEq/L   Potassium 4.8  3.7 - 5.3 mEq/L   Chloride 105  96 - 112 mEq/L   CO2 21  19 - 32 mEq/L   Glucose, Bld 98  70 - 99 mg/dL   BUN 17  6 - 23 mg/dL   Creatinine, Ser 1.63 (*) 0.50 - 1.10 mg/dL   Calcium 8.5  8.4 - 10.5 mg/dL   GFR calc non Af Amer 32 (*) >90 mL/min   GFR calc Af Amer 37 (*) >90 mL/min   Anion gap 13  5 - 15   No results found.  Assessment/Plan: Diagnosis: deconditioned after multiple medical issues and fall 1. Does the need for close, 24 hr/day medical supervision in concert with the patient's rehab needs make it unreasonable for this patient to be served in a less intensive setting? Yes 2. Co-Morbidities requiring supervision/potential complications: Morbid obesity, chronic low back pain, severe lymphedema, AS 3. Due to bladder management, bowel management, safety, skin/wound care, disease management, medication administration, pain management and patient education, does the patient require 24 hr/day rehab nursing? Yes 4. Does the patient require coordinated care of a physician, rehab nurse, PT (1-2 hrs/day, 5 days/week) and OT (1-2 hrs/day, 5 days/week) to address physical and functional deficits in the context of the above medical diagnosis(es)? Yes Addressing deficits in the following areas: balance, endurance, locomotion, strength, transferring, bowel/bladder control, bathing, dressing, feeding, grooming, toileting and psychosocial support 5. Can the patient actively participate in an intensive therapy program of at least 3 hrs of  therapy per day at least 5 days per week? Yes 6. The potential for patient to make measurable gains while on inpatient rehab is excellent 7. Anticipated functional outcomes upon discharge from inpatient rehab are modified independent  with PT, modified independent with OT, n/a with SLP. 8. Estimated rehab length of stay to reach the above functional goals is: 7 days 9. Does the patient have adequate social supports to accommodate these discharge functional goals? Yes 10. Anticipated D/C setting: Home 11. Anticipated  post D/C treatments: HH therapy 12. Overall Rehab/Functional Prognosis: excellent  RECOMMENDATIONS: This patient's condition is appropriate for continued rehabilitative care in the following setting: CIR Patient has agreed to participate in recommended program. Yes Note that insurance prior authorization may be required for reimbursement for recommended care.  Comment: Given the patient's ongoing medical issues, persistent back pain, morbid obesity, and disposition (will be alone during day/ 5STE), CIR is the best fit to achieve this patient's functional and physical goals. Rehab Admissions Coordinator to follow up.  Thanks,  Meredith Staggers, MD, Mellody Drown     11/21/2013

## 2013-11-21 NOTE — Progress Notes (Signed)
Received prescreen request for inpatient rehab. Noted that rehab consult has already been ordered. We will await completion of rehab consult and an admission coordinator will follow up.  Thank you.  Nanetta Batty, PT Rehabilitation Admissions Coordinator [phone]

## 2013-11-21 NOTE — Progress Notes (Signed)
[facility]  [name] [name] PNT:[id number] DOB: [birth year] DOA: 11/17/2013 PCP: Webb Silversmith, NP  Brief narrative: 68 year old BF PMHx diastolic dysfunction; Morbid obesity; HTN ,SVT,mild Aortic stenosis who presented to the hospital with worsening dyspnea on exertion, and fatigue. Patient said that she gets short of breath while walking short distances, unable to climb 1 flight of stairs. She also complained of nausea and vomiting, no fever. Denied chest pain or syncope. Her last echocardiogram in October 2013 showed index valve area of 1 cm square.   In the ED patient was found to have abnormal UA and leukocytosis. Started on IV Rocephin. Chest x-ray did not show any significant abnormality.  HPI/Subjective: Awake- still endorsing fatigue but flank pain better and tolerating Vicodin without nausea  Assessment/Plan:  Sepsis due to E Coli UTI/pyelonephritis  -WBC's trending down and no longer febrile  -will continue current antibiotics  -8/18 sensitive to Rocephin and fluoroquinolones but opted to dc Levaquin since Qtc 495 ms -blood cx NGTD -add Vicodin for pain -8/17 discontinued vancomycin since suspect Escherichia coli primary source of sepsis and MRSA PCR was negative  Bradycardia and complete heart block with junctional escape rhythm -resolved after BB held but unfortunately developed HR to 150s off BB so Cards resumed low dose -CHB event was transient and patient reports some lightheadedness and nausea so could also explain DOE -EKG with tachycardia was concerning for SVT or AVN re entrant tachycardia (narrow complex/no p's) -Cards okay'd transfer to telemetry  Chronic Diastolic CHF -See bradycardia and complete heart block -Per cardiology  Dyspnea on exertion  -echo demonstrated grade 2 diastolic dysfunction; mild aortic stenosis.  -Cardiac enzymes neg X 3 and no abnormalities on EKG for acute ischemia.  -was started on low dose  lasix daily but due to worsening renal function Cards holding Lasix -daily weight  -last CXR unremarkable  Diabetes mellitus  -current CBGs controlled -cont to hold oral hypoglycemics and continue SSI while inpatient  -Obtain A1c -Obtain lipid panel  Chronic anemia -check anemia panel  Hypokalemia  -Resolved  Physical deconditioning  -CIR has been consulted  -will follow rec's   DVT prophylaxis: Subcutaneous heparin Code Status: Full Family Communication: No family at bedside Disposition Plan/Expected LOS: Transfer to Telemetry-CIR evaluation in process  Consultants: Cardiology  Procedures: 8/14 2-D echocardiogram - Left ventricle:  Mild concentric hypertrophy. LVEF= 60% to 65%.  -(grade 2 diastolic dysfunction). -Left atrium:  mildly dilated.  Cultures: 8/14 blood cultures x2 >>> pending 8/14 urine culture >>> Escherichia coli  Antibiotics: Ceftriaxone 8/14 >>> Levaquin 8/14 >>> 8/18 Vancomycin 8/14 >>>817  Objective: Blood pressure 126/84, pulse 61, temperature 98.6 F (37 C), temperature source Oral, resp. rate 16, height 5\' 11"  (1.803 m), weight 337 lb 15.4 oz (153.3 kg), SpO2 96.00%.  Intake/Output Summary (Last 24 hours) at 11/21/13 1239 Last data filed at 11/21/13 0900  Gross per 24 hour  Intake   1120 ml  Output    650 ml  Net    470 ml   Exam: Gen: No acute respiratory distress Chest: Clear to auscultation bilaterally without wheezes, rhonchi or crackles, room air Cardiac: Regular rate and rhythm, S1-S2, no rubs murmurs or gallops, no peripheral edema, no JVD GU; positive CVAT on right Abdomen: Soft nontender nondistended without obvious hepatosplenomegaly, no ascites Extremities: Symmetrical in appearance without cyanosis, clubbing or effusion  Scheduled Meds:  Scheduled Meds: . aspirin EC  81 mg Oral Q breakfast  . heparin  5,000  Units Subcutaneous 3 times per day  . insulin aspart  0-15 Units Subcutaneous TID WC  . metoprolol tartrate   25 mg Oral BID  . pantoprazole  40 mg Oral Daily  . potassium chloride  40 mEq Oral Daily  . sodium chloride  3 mL Intravenous Q12H    Data Reviewed: Basic Metabolic Panel:  Recent Labs Lab 11/17/13 1036 11/18/13 0306 11/19/13 1406 11/20/13 0250 11/21/13 0303  NA 135* 135*  --  139 139  K 3.2* 3.5*  --  4.3 4.8  CL 95* 98  --  104 105  CO2 23 24  --  23 21  GLUCOSE 178* 117*  --  96 98  BUN 20 24*  --  17 17  CREATININE 1.00 1.47*  --  1.48* 1.63*  CALCIUM 9.3 8.9  --  8.7 8.5  MG  --   --  2.1  --   --    Liver Function Tests:  Recent Labs Lab 11/17/13 1036 11/18/13 0306  AST 34 59*  ALT 35 34  ALKPHOS 86 75  BILITOT 1.1 0.7  PROT 8.8* 7.9  ALBUMIN 2.7* 2.3*    Recent Labs Lab 11/17/13 1036  LIPASE 22   CBC:  Recent Labs Lab 11/17/13 1036 11/18/13 0306 11/20/13 0250  WBC 19.6* 13.9* 8.2  NEUTROABS 17.0*  --   --   HGB 11.1* 9.4* 8.7*  HCT 33.5* 28.7* 27.5*  MCV 75.3* 76.3* 77.5*  PLT 356 363 427*   Cardiac Enzymes:  Recent Labs Lab 11/17/13 1529 11/17/13 2056 11/18/13 0306  TROPONINI <0.30 <0.30 <0.30   BNP (last 3 results)  Recent Labs  08/02/13 1725 11/17/13 1035  PROBNP 601.2* 1875.0*   CBG:  Recent Labs Lab 11/20/13 0834 11/20/13 1219 11/20/13 1712 11/20/13 2118 11/21/13 0823  GLUCAP 122* 94 105* 141* 100*    Recent Results (from the past 240 hour(s))  URINE CULTURE     Status: None   Collection Time    11/17/13 11:37 AM      Result Value Ref Range Status   Specimen Description URINE, CLEAN CATCH   Final   Special Requests NONE   Final   Culture  Setup Time     Final   Value: 11/18/2013 20:03     Performed at [facility]     Final   Value: >=100,000 COLONIES/ML     Performed at [facility]   Culture     Final   Value: ESCHERICHIA COLI     Performed at [facility]   Report Status 11/21/2013 FINAL   Final   Organism ID, Bacteria ESCHERICHIA COLI   Final  CULTURE,  BLOOD (ROUTINE X 2)     Status: None   Collection Time    11/17/13 12:16 PM      Result Value Ref Range Status   Specimen Description BLOOD RIGHT ANTECUBITAL   Final   Special Requests BOTTLES DRAWN AEROBIC AND ANAEROBIC 5CC   Final   Culture  Setup Time     Final   Value: 11/17/2013 14:17     Performed at [facility]   Culture     Final   Value:        BLOOD CULTURE RECEIVED NO GROWTH TO DATE CULTURE WILL BE HELD FOR 5 DAYS BEFORE ISSUING A FINAL NEGATIVE REPORT     Performed at [facility]   Report Status PENDING   Incomplete  CULTURE, BLOOD (ROUTINE X 2)     Status: None   Collection Time    11/17/13 12:16 PM      Result Value Ref Range Status   Specimen Description BLOOD LEFT HAND   Final   Special Requests BOTTLES DRAWN AEROBIC ONLY 3CC   Final   Culture  Setup Time     Final   Value: 11/17/2013 14:17     Performed at [facility]   Culture     Final   Value:        BLOOD CULTURE RECEIVED NO GROWTH TO DATE CULTURE WILL BE HELD FOR 5 DAYS BEFORE ISSUING A FINAL NEGATIVE REPORT     Performed at [facility]   Report Status PENDING   Incomplete  MRSA PCR SCREENING     Status: None   Collection Time    11/19/13  4:54 PM      Result Value Ref Range Status   MRSA by PCR NEGATIVE  NEGATIVE Final   Comment:            The GeneXpert MRSA Assay (FDA     approved for NASAL specimens     only), is one component of a     comprehensive MRSA colonization     surveillance program. It is not     intended to diagnose MRSA     infection nor to guide or     monitor treatment for     MRSA infections.     Studies:  Recent x-ray studies have been reviewed in detail by the Attending Physician  Time spent :  [place], ANP [facility] Office  [phone] Pager [phone]   **If unable to reach the above provider after paging please contact the [place] @ [phone]  On-Call/Text Page:      [email or web address]      password  [email or web address]  If 7PM-7AM, please contact night-coverage [email or web address] Password [email or web address] 11/21/2013, 12:39 PM   LOS: 4 days    Examined patient with ANP Ebony Hail discussed assessment and plan and agree with the above plan.  Patient with multiple complex medical problems> 40 minutes spent in direct patient care

## 2013-11-21 NOTE — Evaluation (Signed)
Occupational Therapy Evaluation Patient Details Name: [name] [name] MRN: [id number] DOB: 1945/11/02 Today's Date: 11/21/2013    History of Present Illness Pt is a 68 y/o female admitted with worsening DOE, fatigue, nausea and vomiting. Patient has a history of aortic stenosis. In the ED patient was found to have abnormal UA and leukocytosis consistent with sepsis from UTI as well as cellulitis. She was noted on tele to have some second degree AV block.   Clinical Impression   Pt admitted with above. She demonstrates the below listed deficits and will benefit from continued OT to maximize safety and independence with BADLs.  Pt currently requires min - mod A with BADLs.  PTA she was modified independent and was able to assist with grocery shopping and cooking activities at home.  Feel he would greatly benefit from CIR to enable her to return home at modified independent level.        Follow Up Recommendations  CIR;Supervision/Assistance - 24 hour    Equipment Recommendations  3 in 1 bedside comode;Tub/shower bench    Recommendations for Other Services       Precautions / Restrictions Precautions Precautions: Fall Precaution Comments: Pt with poor safety awareness      Mobility Bed Mobility Overal bed mobility: Needs Assistance Bed Mobility: Supine to Sit;Sit to Supine     Supine to sit: Supervision Sit to supine: Supervision      Transfers Overall transfer level: Needs assistance Equipment used: Rolling walker (2 wheeled) Transfers: Sit to/from [facility] Sit to Stand: Min assist Stand pivot transfers: Min assist       General transfer comment: Pt requires cues for hand placement and walker safety.  Keeps walker too far away from  body.    Balance Overall balance assessment: Needs assistance Sitting-balance support: Feet supported Sitting balance-Leahy Scale: Good     Standing balance support: Single extremity supported Standing  balance-Leahy Scale: Poor                              ADL Overall ADL's : Needs assistance/impaired Eating/Feeding: Independent;Sitting;Bed level   Grooming: Wash/dry hands;Minimal assistance;Standing   Upper Body Bathing: Minimal assitance;Sitting   Lower Body Bathing: Moderate assistance;Sit to/from stand   Upper Body Dressing : Minimal assistance;Sitting   Lower Body Dressing: Moderate assistance;Sit to/from stand   Toilet Transfer: Minimal assistance;Ambulation;RW Toilet Transfer Details (indicate cue type and reason): requires mod verbal cues for walker safety  Toileting- Clothing Manipulation and Hygiene: Moderate assistance;Sit to/from stand       Functional mobility during ADLs: Minimal assistance;Rolling walker General ADL Comments: Pt fatigues, requiring rest breaks during eval, but is very motivated to return to modified independent level      Vision                     Perception     Praxis      Pertinent Vitals/Pain Pain Assessment: No/denies pain     Hand Dominance Right   Extremity/Trunk Assessment Upper Extremity Assessment Upper Extremity Assessment: Generalized weakness   Lower Extremity Assessment Lower Extremity Assessment: Defer to PT evaluation   Cervical / Trunk Assessment Cervical / Trunk Assessment: Normal   Communication Communication Communication: No difficulties   Cognition Arousal/Alertness: Awake/alert Behavior During Therapy: WFL for tasks assessed/performed Overall Cognitive Status: Within Functional Limits for tasks assessed  General Comments       Exercises       Shoulder Instructions      Home Living Family/patient expects to be discharged to:: Private residence Living Arrangements: Children Available Help at Discharge: Family;Friend(s);Available PRN/intermittently Type of Home: Apartment Home Access: Stairs to enter Entrance Stairs-Number of Steps: 5 Entrance  Stairs-Rails: Right;Left;Can reach both Home Layout: One level     Bathroom Shower/Tub: Tub/shower unit;Curtain Shower/tub characteristics: Architectural technologist: Standard     Home Equipment: Environmental consultant - 2 wheels;Cane - single point          Prior Functioning/Environment Level of Independence: Independent with assistive device(s)        Comments: Does not drive anymore. Friends assist with grocery shopping.  Pt reports she was able to go to [employer] 2 wks PTA.   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    OT Diagnosis: Generalized weakness   OT Problem List: Decreased strength;Decreased activity tolerance;Impaired balance (sitting and/or standing);Decreased safety awareness;Decreased knowledge of use of DME or AE;Cardiopulmonary status limiting activity;Obesity   OT Treatment/Interventions: Self-care/ADL training;Therapeutic exercise;Therapeutic activities;Patient/family education;Balance training    OT Goals(Current goals can be found in the care plan section) Acute Rehab OT Goals Patient Stated Goal: To return home independently OT Goal Formulation: With patient Time For Goal Achievement: 12/05/13 Potential to Achieve Goals: Good ADL Goals Pt Will Perform Grooming: with supervision;standing Pt Will Perform Upper Body Bathing: with set-up;sitting Pt Will Perform Lower Body Bathing: with supervision;sit to/from stand Pt Will Perform Upper Body Dressing: with supervision;with set-up;sitting Pt Will Perform Lower Body Dressing: with supervision;sit to/from stand Pt Will Transfer to Toilet: with supervision;ambulating;regular height toilet;bedside commode;grab bars Pt Will Perform Toileting - Clothing Manipulation and hygiene: with supervision;sit to/from stand  OT Frequency: Min 2X/week   Barriers to D/C: Decreased caregiver support          Co-evaluation              End of Session Equipment Utilized During Treatment: Rolling walker Nurse  Communication: Mobility status  Activity Tolerance: Patient tolerated treatment well Patient left: in bed;with call bell/phone within reach   Time: 1322-1346 OT Time Calculation (min): 24 min Charges:  OT General Charges $OT Visit: 1 Procedure OT Evaluation $Initial OT Evaluation Tier I: 1 Procedure OT Treatments $Self Care/Home Management : 8-22 mins G-Codes:    [name] [name] Denes M 11-22-2013, 1:58 PM

## 2013-11-21 NOTE — Progress Notes (Signed)
I met with pt at bedside. We discussed the possibility of an inpt rehab admission pending [employer] approval which I will begin  today. I will follow up tomorrow. [phone]

## 2013-11-21 NOTE — Progress Notes (Addendum)
Patient Name: [name] [name] Date of Encounter: 11/21/2013  Active Problems:   Venous stasis of lower extremity   HTN (hypertension)   DM type 2 (diabetes mellitus, type 2)   UTI (lower urinary tract infection)   Sepsis   Aortic stenosis   Cellulitis of leg, left   Complete heart block   Length of Stay: 4  SUBJECTIVE  The patient states that she feels weak and constipated. No more pauses or bradycardia on telemetry.   CURRENT MEDS . aspirin EC  81 mg Oral Q breakfast  . heparin  5,000 Units Subcutaneous 3 times per day  . insulin aspart  0-15 Units Subcutaneous TID WC  . metoprolol tartrate  25 mg Oral BID  . pantoprazole  40 mg Oral Daily  . potassium chloride  40 mEq Oral Daily  . sodium chloride  3 mL Intravenous Q12H   OBJECTIVE  Filed Vitals:   11/21/13 0600 11/21/13 0800 11/21/13 0825 11/21/13 1034  BP: 109/52  126/84   Pulse:  60  61  Temp:   98.6 F (37 C)   TempSrc:   Oral   Resp:  16 16   Height:      Weight:      SpO2:   96%     Intake/Output Summary (Last 24 hours) at 11/21/13 1053 Last data filed at 11/21/13 0900  Gross per 24 hour  Intake   1360 ml  Output    650 ml  Net    710 ml   Filed Weights   11/19/13 1700 11/20/13 0500 11/21/13 0335  Weight: 339 lb 8.1 oz (154 kg) 336 lb 13.8 oz (152.8 kg) 337 lb 15.4 oz (153.3 kg)    PHYSICAL EXAM  General: Pleasant, NAD. Neuro: Alert and oriented X 3. Moves all extremities spontaneously. Psych: Normal affect. HEENT:  Normal  Neck: Supple without bruits or JVD. Lungs:  Resp regular and unlabored, CTA. Heart: RRR no s3, s4, or murmurs. Abdomen: Soft, non-tender, non-distended, BS + x 4.  Extremities: No clubbing, cyanosis or edema. DP/PT/Radials 2+ and equal bilaterally.  Accessory Clinical Findings  CBC  Recent Labs  11/20/13 0250  WBC 8.2  HGB 8.7*  HCT 27.5*  MCV 77.5*  PLT 161*   Basic Metabolic Panel  Recent Labs  11/19/13 1406 11/20/13 0250 11/21/13 0303  NA  --   139 139  K  --  4.3 4.8  CL  --  104 105  CO2  --  23 21  GLUCOSE  --  96 98  BUN  --  17 17  CREATININE  --  1.48* 1.63*  CALCIUM  --  8.7 8.5  MG 2.1  --   --    Dg Chest 2 View  11/17/2013   CLINICAL DATA:  Difficulty breathing  EXAM: CHEST  2 VIEW  COMPARISON:  August 02, 2013 chest radiograph and chest CT  FINDINGS: There is no edema or consolidation. The heart size and pulmonary vascularity are normal. No adenopathy. There is degenerative change in the thoracic spine.  IMPRESSION: No edema or consolidation.   Electronically Signed   By: Lowella Grip M.D.   On: 11/17/2013 11:27   Dg Lumbar Spine Complete  11/17/2013   CLINICAL DATA:  Weakness, CVA rib back pain  EXAM: LUMBAR SPINE - COMPLETE 4+ VIEW  COMPARISON:  None.  FINDINGS: Five views of lumbar spine submitted. There is disc space flattening with anterior spurring at L4-L5 and L5-S1 level. Narrowing of  neural foramina noted bilaterally at L5-S1 level. Facet degenerative changes L3-L4 and L5 level. Anterior spurring upper endplate of L4 vertebral body. Degenerative changes lower thoracic spine. No acute fracture or subluxation.  IMPRESSION: No acute fracture or subluxation. Degenerative changes as described above.     TELE: SR 70-80 BPM    ASSESSMENT AND PLAN  1. Transient complete heart block with junctional escape - BB stopped on admission and then restarted for tachycardia at the lower dose ( Metoprolol 100---> 25 mg PO BID). Tolerating it well.  2. SOB with echo showing normal LVF and diastolic dysfunction, mild AS. Cardiac enzymes neg x 3 She received IV lasix on admit for mildly elevated BNP with worsening of renal function. Chest xray is clear. Now off PO Lasix for worsening Crea. Appears euvolemic. Hold lasix.  3. UTI with sepsis   4. Cellulitis of LE   5. HTN - controlled  6. Constipation - we will order milk of magnesia  The patient can be transferred to the telemetry floor.  Signed, Dorothy Spark MD,  [facility] 11/21/2013

## 2013-11-22 DIAGNOSIS — I872 Venous insufficiency (chronic) (peripheral): Secondary | ICD-10-CM

## 2013-11-22 DIAGNOSIS — I10 Essential (primary) hypertension: Secondary | ICD-10-CM

## 2013-11-22 LAB — LIPID PANEL
Cholesterol: 109 mg/dL (ref 0–200)
HDL: 21 mg/dL — ABNORMAL LOW (ref >39)
LDL Cholesterol: 69 mg/dL (ref 0–99)
Total CHOL/HDL Ratio: 5.2 RATIO
Triglycerides: 97 mg/dL (ref <150)
VLDL: 19 mg/dL (ref 0–40)

## 2013-11-22 LAB — BASIC METABOLIC PANEL
Anion gap: 11 (ref 5–15)
BUN: 17 mg/dL (ref 6–23)
CO2: 23 mEq/L (ref 19–32)
Calcium: 8.9 mg/dL (ref 8.4–10.5)
Chloride: 103 mEq/L (ref 96–112)
Creatinine, Ser: 1.77 mg/dL — ABNORMAL HIGH (ref 0.50–1.10)
GFR calc Af Amer: 33 mL/min — ABNORMAL LOW (ref >90)
GFR calc non Af Amer: 29 mL/min — ABNORMAL LOW (ref >90)
Glucose, Bld: 106 mg/dL — ABNORMAL HIGH (ref 70–99)
Potassium: 4.9 mEq/L (ref 3.7–5.3)
Sodium: 137 mEq/L (ref 137–147)

## 2013-11-22 LAB — CBC
HCT: 27.8 % — ABNORMAL LOW (ref 36.0–46.0)
Hemoglobin: 9.1 g/dL — ABNORMAL LOW (ref 12.0–15.0)
MCH: 25.5 pg — ABNORMAL LOW (ref 26.0–34.0)
MCHC: 32.7 g/dL (ref 30.0–36.0)
MCV: 77.9 fL — ABNORMAL LOW (ref 78.0–100.0)
Platelets: 453 10*3/uL — ABNORMAL HIGH (ref 150–400)
RBC: 3.57 MIL/uL — ABNORMAL LOW (ref 3.87–5.11)
RDW: 16.3 % — ABNORMAL HIGH (ref 11.5–15.5)
WBC: 6.9 10*3/uL (ref 4.0–10.5)

## 2013-11-22 LAB — GLUCOSE, CAPILLARY
Glucose-Capillary: 98 mg/dL (ref 70–99)
Glucose-Capillary: 99 mg/dL (ref 70–99)
Glucose-Capillary: 133 mg/dL — ABNORMAL HIGH (ref 70–99)
Glucose-Capillary: 150 mg/dL — ABNORMAL HIGH (ref 70–99)

## 2013-11-22 LAB — FERRITIN: Ferritin: 171 ng/mL (ref 10–291)

## 2013-11-22 LAB — IRON AND TIBC
Iron: 30 ug/dL — ABNORMAL LOW (ref 42–135)
Saturation Ratios: 17 % — ABNORMAL LOW (ref 20–55)
TIBC: 180 ug/dL — ABNORMAL LOW (ref 250–470)
UIBC: 150 ug/dL (ref 125–400)

## 2013-11-22 LAB — HEMOGLOBIN A1C
Hgb A1c MFr Bld: 6.7 % — ABNORMAL HIGH (ref <5.7)
Mean Plasma Glucose: 146 mg/dL — ABNORMAL HIGH (ref <117)

## 2013-11-22 LAB — RETICULOCYTES
RBC.: 3.57 MIL/uL — ABNORMAL LOW (ref 3.87–5.11)
Retic Count, Absolute: 46.4 10*3/uL (ref 19.0–186.0)
Retic Ct Pct: 1.3 % (ref 0.4–3.1)

## 2013-11-22 LAB — VITAMIN B12: Vitamin B-12: 1176 pg/mL — ABNORMAL HIGH (ref 211–911)

## 2013-11-22 LAB — FOLATE: Folate: 17.3 ng/mL

## 2013-11-22 MED ORDER — SENNOSIDES-DOCUSATE SODIUM 8.6-50 MG PO TABS
2.0000 | ORAL_TABLET | Freq: Every day | ORAL | Status: DC
  Administered 2013-11-22: 2 via ORAL
  Filled 2013-11-22 (×2): qty 2

## 2013-11-22 MED ORDER — BISACODYL 10 MG RE SUPP: 10.0000 mg | Freq: Every day | RECTAL | Status: DC | PRN

## 2013-11-22 MED ORDER — CAPSICUM OLEORESIN 0.025 % EX CREA: TOPICAL_CREAM | Freq: Three times a day (TID) | CUTANEOUS | Status: DC | PRN

## 2013-11-22 MED ORDER — ACETAMINOPHEN 325 MG PO TABS: 650.0000 mg | ORAL_TABLET | Freq: Four times a day (QID) | ORAL | Status: DC | PRN

## 2013-11-22 MED ORDER — INSULIN ASPART 100 UNIT/ML ~~LOC~~ SOLN: 0.0000 [IU] | Freq: Three times a day (TID) | SUBCUTANEOUS | Status: DC

## 2013-11-22 MED ORDER — HYDROCODONE-ACETAMINOPHEN 5-325 MG PO TABS: 1.0000 | ORAL_TABLET | ORAL | Status: DC | PRN

## 2013-11-22 MED ORDER — METOPROLOL TARTRATE 1 MG/ML IV SOLN
5.0000 mg | Freq: Once | INTRAVENOUS | Status: AC
  Administered 2013-11-22: 5 mg via INTRAVENOUS
  Filled 2013-11-22: qty 5

## 2013-11-22 MED ORDER — ONDANSETRON HCL 4 MG PO TABS: 4.0000 mg | ORAL_TABLET | Freq: Four times a day (QID) | ORAL | Status: DC | PRN

## 2013-11-22 MED ORDER — HEPARIN SODIUM (PORCINE) 5000 UNIT/ML IJ SOLN: 5000.0000 [IU] | Freq: Three times a day (TID) | INTRAMUSCULAR | Status: DC

## 2013-11-22 MED ORDER — METHOCARBAMOL 500 MG PO TABS: 500.0000 mg | ORAL_TABLET | Freq: Four times a day (QID) | ORAL | Status: DC | PRN

## 2013-11-22 MED ORDER — METOPROLOL TARTRATE 25 MG PO TABS: 25.0000 mg | ORAL_TABLET | Freq: Two times a day (BID) | ORAL | Status: DC

## 2013-11-22 MED ORDER — METOPROLOL TARTRATE 12.5 MG HALF TABLET
12.5000 mg | ORAL_TABLET | Freq: Once | ORAL | Status: AC
  Administered 2013-11-22: 12.5 mg via ORAL
  Filled 2013-11-22: qty 1

## 2013-11-22 MED ORDER — MAGNESIUM HYDROXIDE 400 MG/5ML PO SUSP: 30.0000 mL | Freq: Every day | ORAL | Status: DC | PRN

## 2013-11-22 MED ORDER — BISACODYL 10 MG RE SUPP
10.0000 mg | Freq: Once | RECTAL | Status: AC
  Administered 2013-11-22: 10 mg via RECTAL
  Filled 2013-11-22: qty 1

## 2013-11-22 MED ORDER — SENNOSIDES-DOCUSATE SODIUM 8.6-50 MG PO TABS
2.0000 | ORAL_TABLET | Freq: Every day | ORAL | Status: DC
Start: 2013-11-22 — End: 2013-12-09

## 2013-11-22 NOTE — PMR Pre-admission (Addendum)
PMR Admission Coordinator Pre-Admission Assessment  Patient: [name] [name] is an 68 y.o., female MRN: [id number] DOB: [birth year] Height: 5\' 11"  (180.3 cm) Weight: 149.8 kg (330 lb 4 oz)              Insurance Information HMO: yes    PPO:      PCP:      IPA:      80/20:      OTHER: medicare replacement policy PRIMARY: Blue Medicare      Policy#: [id number]      Subscriber: pt CM Name: Lashalla H.      Phone#: [phone]     Fax#: [phone] Pre-Cert#: [id number]      Employer: retired Benefits:  Phone #: [phone]     Name: 11/22/13 Eff. Date: 04/06/13     Deduct: none      Out of Pocket Max: $4900      Life Max: none CIR: $285 per day days 1-6      SNF: no copay days 1-20; $60 copay days  21-100 Outpatient: $40 per visit     Co-Pay: no visit limit Home Health: 100%      Co-Pay: no visit limit DME: 80%     Co-Pay: 20% Providers: in network  SECONDARY: none        Medicaid Application Date:       Case Manager:  Disability Application Date:       Case Worker:   Emergency Facilities manager Information   Name Relation Home Work LeRoy Daughter [phone]     Hocker,Candice Daughter [phone]       Current Medical History  Patient Admitting Diagnosis:deconditioned after multiple medical issues and fall  History of Present Illness:[name] [name] is a 68 y.o. female with history of CHF, morbid obesity, SVT, PAC, PVS's, mild AS who was admitted on 11/17/13 with SOB, weeping edema BLE, fatigue as well as nausea and vomiting. She was treated for sepsis as work up with leucocytosis due to E coli UTI as well as BLE cellulitis. 2D echo with EF 60-655 with grade 2 diastolic dysfunction. Pro BNP-1875 and CXR without edema. She was treated with IV diuretics but has had worsening of renal status. Creat 1.7 and pt currently euvolemic.  She developed 2nd degree heart block and metoprolol d/c. Dr. Radford Pax was consulted for input and recommended restarting Lorsartan as  well as cardiac monitoring till BB washed out. Cardiac enzymes X 3 negative. Heart block has resolved with tachycardia and low dose metoprolol resumed.   Patient developed tachycardia in the 150s, so cardiology resumed metoprolol at 25 mg bid ( had been on 100 mg bid). Nausea in am of 9/19 and metoprolol held until after lunch. Heartrate elevated and an additional 12.5 mg given.  Diabetes mellitus with metformin held in the setting of acute renal failure. Currently controlled on sliding scale novolog.   Past Medical History  Past Medical History  Diagnosis Date  . Edema   . Palpitations   . History of diastolic dysfunction     Echo 08/275 with diastolic dysfunction  . Arthritis   . OA (osteoarthritis)   . PAC (premature atrial contraction)     per prior Holter  . PVC's (premature ventricular contractions)     per prior Holter  . Morbid obesity   . HTN (hypertension)   . Tachycardia     noted at 07/28/11 visit. Started on beta blocker. Possible atrial flutter vs long PR tachycardia/AVNRT  . SVT (supraventricular  tachycardia)   . Aortic stenosis, mild 11/17/2013  . CHF (congestive heart failure)     Family History  family history includes Alzheimer's disease in her mother; COPD in her father; Diabetes in her maternal uncle; Lung cancer in her mother. There is no history of Stroke.  Prior Rehab/Hospitalizations: none  Current Medications  Current facility-administered medications:acetaminophen (TYLENOL) suppository 650 mg, 650 mg, Rectal, Q6H PRN, Oswald Hillock, MD;  acetaminophen (TYLENOL) tablet 650 mg, 650 mg, Oral, Q6H PRN, Oswald Hillock, MD, 650 mg at 11/20/13 8676;  aspirin EC tablet 81 mg, 81 mg, Oral, Q breakfast, Oswald Hillock, MD, 81 mg at 11/22/13 1154;  bisacodyl (DULCOLAX) suppository 10 mg, 10 mg, Rectal, Once, Delfina Redwood, MD capsicum oleoresin (TRIXAICIN) 0.025 % cream, , Topical, TID PRN, Ritta Slot, NP;  heparin injection 5,000 Units, 5,000 Units, Subcutaneous,  3 times per day, Oswald Hillock, MD, 5,000 Units at 11/22/13 1443;  hydrALAZINE (APRESOLINE) injection 10 mg, 10 mg, Intravenous, Q6H PRN, Barton Dubois, MD;  HYDROcodone-acetaminophen (NORCO/VICODIN) 5-325 MG per tablet 1-2 tablet, 1-2 tablet, Oral, Q4H PRN, Samella Parr, NP, 2 tablet at 11/21/13 0516 insulin aspart (novoLOG) injection 0-15 Units, 0-15 Units, Subcutaneous, TID WC, Oswald Hillock, MD, 2 Units at 11/21/13 1300;  magnesium hydroxide (MILK OF MAGNESIA) suspension 30 mL, 30 mL, Oral, Daily PRN, Dorothy Spark, MD, 30 mL at 11/20/13 1450;  methocarbamol (ROBAXIN) tablet 500 mg, 500 mg, Oral, Q6H PRN, Barton Dubois, MD, 500 mg at 11/22/13 0717 metoprolol tartrate (LOPRESSOR) tablet 25 mg, 25 mg, Oral, BID, Dorothy Spark, MD, 25 mg at 11/22/13 1340;  ondansetron (ZOFRAN) injection 4 mg, 4 mg, Intravenous, Q6H PRN, Oswald Hillock, MD;  ondansetron (ZOFRAN) tablet 4 mg, 4 mg, Oral, Q6H PRN, Oswald Hillock, MD, 4 mg at 11/22/13 1154;  pantoprazole (PROTONIX) EC tablet 40 mg, 40 mg, Oral, Daily, Barton Dubois, MD, 40 mg at 11/22/13 1154 potassium chloride SA (K-DUR,KLOR-CON) CR tablet 40 mEq, 40 mEq, Oral, Daily, Barton Dubois, MD, 40 mEq at 11/22/13 1340;  senna-docusate (Senokot-S) tablet 2 tablet, 2 tablet, Oral, QHS, Delfina Redwood, MD;  sodium chloride 0.9 % injection 3 mL, 3 mL, Intravenous, Q12H, Oswald Hillock, MD, 3 mL at 11/22/13 1155  Patients Current Diet: Carb Control  Precautions / Restrictions Precautions Precautions: Fall Precaution Comments: Pt with poor safety awareness Restrictions Weight Bearing Restrictions: No   Prior Activity Level Community (5-7x/wk): pt active church member, bible study, Nurse, adult / [place] Devices/Equipment: Cane (specify quad or straight);Walker (specify type);Eyeglasses (straight cane, front wheeled walker) Home Equipment: Walker - 2 wheels;Cane - single point  Prior Functional Level Prior Function Level  of Independence: Independent with assistive device(s) Comments: Does not drive anymore. Friends assist with grocery shopping.  Pt reports she was able to go to [employer] 2 wks PTA.   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  Current Functional Level Cognition  Overall Cognitive Status: Within Functional Limits for tasks assessed Orientation Level: Oriented X4    Extremity Assessment (includes Sensation/Coordination)          ADLs  Overall ADL's : Needs assistance/impaired Eating/Feeding: Independent;Sitting;Bed level Grooming: Wash/dry hands;Minimal assistance;Standing Upper Body Bathing: Minimal assitance;Sitting Lower Body Bathing: Moderate assistance;Sit to/from stand Upper Body Dressing : Minimal assistance;Sitting Lower Body Dressing: Moderate assistance;Sit to/from stand Toilet Transfer: Minimal assistance;Ambulation;RW Toilet Transfer Details (indicate cue type and reason): requires mod verbal cues for walker  safety  Toileting- Clothing Manipulation and Hygiene: Moderate assistance;Sit to/from stand Functional mobility during ADLs: Minimal assistance;Rolling walker General ADL Comments: Pt fatigues, requiring rest breaks during eval, but is very motivated to return to modified independent level     Mobility  Overal bed mobility: Needs Assistance Bed Mobility: Supine to Sit;Sit to Supine Supine to sit: Supervision Sit to supine: Supervision General bed mobility comments: Pt requires increased time and cueing for bed rail use for support. She was able to transition to full sitting EOB without physical assist, only close guarding.     Transfers  Overall transfer level: Needs assistance Equipment used: Rolling walker (2 wheeled) Transfers: Sit to/from Stand Sit to Stand: Min assist Stand pivot transfers: Min assist General transfer comment: Pt uses rocking momentum to come to stand. Assist to bring hips up.    Ambulation / Gait / Stairs / Wheelchair  Mobility  Ambulation/Gait Ambulation/Gait assistance: Museum/gallery curator (Feet): 25 Feet Assistive device: Rolling walker (2 wheeled) Gait Pattern/deviations: Step-through pattern;Decreased step length - right;Decreased step length - left;Trendelenburg;Trunk flexed Gait velocity: Decreased Gait velocity interpretation: Below normal speed for age/gender General Gait Details: Verbal cues to stand more erect. Able to keep hands on walker and not prop on forearms but with incr flexion as distance incr. Pt begins sitting on bed prior to bring walker all the way back to bed.     Posture / Balance      Special needs/care consideration Bowel mgmt: no BM since admission Bladder mgmt: continent. Suppository given 11/22/13 Diabetic mgmt yes metformin on hold. Using sliding scale   Previous Home Environment Living Arrangements: Children;Other (Comment) (pt lives with daughter, Renaye Rakers)  Lives With: Daughter Available Help at Discharge: Family;Friend(s);Available PRN/intermittently Type of Home: Apartment Home Layout: One level Home Access: Stairs to enter Entrance Stairs-Rails: Right;Left;Can reach both Entrance Stairs-Number of Steps: 5 Bathroom Shower/Tub: Sports coach: Yes How Accessible: Accessible via walker Home Care Services: No  Discharge Living Setting Plans for Discharge Living Setting: Apartment Type of Home at Discharge: Apartment Discharge Home Layout: One level Discharge Home Access: Stairs to enter Entrance Stairs-Rails: Right;Left;Can reach both Entrance Stairs-Number of Steps: 5 Discharge Bathroom Shower/Tub: Tub/shower unit;Curtain Discharge Bathroom Toilet: Standard Discharge Bathroom Accessibility: Yes How Accessible: Accessible via walker Does the patient have any problems obtaining your medications?: No  Social/Family/Support Systems Patient Roles: Parent;Volunteer Contact Information: Alessandra Grout, daughters Anticipated Caregiver: daughters prn Anticipated Caregiver's Contact Information: see above Ability/Limitations of Caregiver: daughter works days Caregiver Availability: Intermittent Discharge Plan Discussed with Primary Caregiver: Yes Is Caregiver In Agreement with Plan?: Yes Does Caregiver/Family have Issues with Lodging/Transportation while Pt is in Rehab?: No  Goals/Additional Needs Patient/Family Goal for Rehab: Mod I with PT and OT Expected length of stay: ELOS 7 days Pt/Family Agrees to Admission and willing to participate: Yes Program Orientation Provided & Reviewed with Pt/Caregiver Including Roles  & Responsibilities: Yes  Decrease burden of Care through IP rehab admission: n/a  Possible need for SNF placement upon discharge: n/a  Patient Condition: This patient's condition remains as documented in the consult dated 11/21/13, in which the Rehabilitation Physician determined and documented that the patient's condition is appropriate for intensive rehabilitative care in an inpatient rehabilitation facility. Will admit to inpatient rehab today.  Preadmission Screen Completed By:  Cleatrice Burke, 11/22/2013 3:18 PM ______________________________________________________________________   Discussed status with Dr. Naaman Plummer on 11/22/2013 at  1506 and received telephone approval for admission today.  Admission Coordinator:  Cleatrice Burke, time 2620 Date 11/22/2013.   Patient did not get transferred to CIR on 8/19 due to sinus tachycardia. RN had held morning metoprolol dose, due to nausea. It was eventually given, as well as an additional 12.5 mg and 5 mg IV. Has been NSR with rate about 70 since yesterday evening. Pt feels well today  11/23/13 with less nausea. Had small BM. Exam unchanged. Creat 1.8  Discussed with Dr. Letta Pate on 11/23/13 at 1000 and received telephone approval for admission today.  Admission Coordinator: Audelia Acton [place],  time 1000 date 11/23/2013.

## 2013-11-22 NOTE — Progress Notes (Signed)
Physical Therapy Treatment Patient Details Name: Rhonda Linan MRN: [id number] DOB: December 14, 1945 Today's Date: 2013-11-30    History of Present Illness Pt is a 68 y/o female admitted with worsening DOE, fatigue, nausea and vomiting. Patient has a history of aortic stenosis. In the ED patient was found to have abnormal UA and leukocytosis consistent with sepsis from UTI as well as cellulitis. She was noted on tele to have some second degree AV block.    PT Comments    Pt making slow, steady progress.  Follow Up Recommendations  CIR     Equipment Recommendations  None recommended by PT    Recommendations for Other Services       Precautions / Restrictions Precautions Precautions: Fall Restrictions Weight Bearing Restrictions: No    Mobility  Bed Mobility                  Transfers Overall transfer level: Needs assistance Equipment used: Rolling walker (2 wheeled) Transfers: Sit to/from Stand Sit to Stand: Min assist         General transfer comment: Pt uses rocking momentum to come to stand. Assist to bring hips up.  Ambulation/Gait Ambulation/Gait assistance: Min assist Ambulation Distance (Feet): 25 Feet Assistive device: Rolling walker (2 wheeled) Gait Pattern/deviations: Step-through pattern;Decreased step length - right;Decreased step length - left;Trendelenburg;Trunk flexed Gait velocity: Decreased Gait velocity interpretation: Below normal speed for age/gender General Gait Details: Verbal cues to stand more erect. Able to keep hands on walker and not prop on forearms but with incr flexion as distance incr. Pt begins sitting on bed prior to bring walker all the way back to bed.    Stairs            Wheelchair Mobility    Modified Rankin (Stroke Patients Only)       Balance Overall balance assessment: Needs assistance Sitting-balance support: No upper extremity supported;Feet supported Sitting balance-Leahy Scale: Good     Standing  balance support: Bilateral upper extremity supported Standing balance-Leahy Scale: Poor Standing balance comment: Requires walker for support.                    Cognition Arousal/Alertness: Awake/alert Behavior During Therapy: WFL for tasks assessed/performed Overall Cognitive Status: Within Functional Limits for tasks assessed                      Exercises      General Comments        Pertinent Vitals/Pain Pain Assessment: No/denies pain    Home Living                      Prior Function            PT Goals (current goals can now be found in the care plan section) Progress towards PT goals: Progressing toward goals    Frequency  Min 3X/week    PT Plan Current plan remains appropriate    Co-evaluation             End of Session   Activity Tolerance: Patient limited by fatigue Patient left: in bed;with call bell/phone within reach (sitting EOB)     Time: 0950-1003 PT Time Calculation (min): 13 min  Charges:  $Gait Training: 8-22 mins                    G Codes:      Fitzpatrick Alberico November 30, 2013, 10:43 AM  Suanne Marker PT [phone]

## 2013-11-22 NOTE — Progress Notes (Signed)
I have insurance approval for admission. RN states pt's heart rate recently elevated and she is contacting cardiology for recommendations. I will follow up after cardiology to assess if pt ready for d/c to inpt rehab today. Discussed with Dr. Conley Canal and Dr. Naaman Plummer. [phone]

## 2013-11-22 NOTE — Progress Notes (Signed)
Pt given IV metoprolol at 17:35 and HR returned to baseline at approximately 19:25.  Pt currently 60's-70's sinus rhythm. Pt resting with call bell within reach.  Will continue to monitor. On coming RN made aware. Payton Emerald, RN

## 2013-11-22 NOTE — Discharge Summary (Signed)
Physician Discharge Summary  [name] [name] QBH:[id number] DOB: [birth year] DOA: 11/17/2013  PCP: Webb Silversmith, NP  Admit date: 11/17/2013 Discharge date: 11/22/2013  Time spent: greater than 30 minutes  Recommendations for Outpatient Follow-up:  1. To CIR 2. Monitor creatinine 3. Monitor CBG qac hs  Discharge Diagnoses:  Active Problems:   Venous stasis of lower extremity   HTN (hypertension)   DM type 2 (diabetes mellitus, type 2)   UTI (lower urinary tract infection)   Sepsis   Aortic stenosis   Cellulitis of leg, left   Complete heart block morbid obesity Deconditioning Acute renal failure  Discharge Condition: stable  Filed Weights   11/20/13 0500 11/21/13 0335 11/22/13 0550  Weight: 152.8 kg (336 lb 13.8 oz) 153.3 kg (337 lb 15.4 oz) 149.8 kg (330 lb 4 oz)    History of present illness/Hospital Course:   68 year old BF PMHx diastolic dysfunction; Morbid obesity; HTN ,SVT,mild Aortic stenosis who presented to [facility] with worsening dyspnea on exertion, and fatigue. Patient said that she gets short of breath while walking short distances, unable to climb 1 flight of stairs. She also complained of nausea and vomiting, no fever. Denied chest pain or syncope. Her last echocardiogram in October 2013 showed index valve area of 1 cm square. In the ED patient was found to have abnormal UA and leukocytosis and bilateral leg cellulitis. Started on antibiotics. Developed transient complete heart block, so was transferred to [facility] and Cardiology consulted   Sepsis due to E Coli UTI/pyelonephritis and bilateral leg cellulitis Has completed course of antibiotic. Afebrile, leukocytosis resolved and legs without erythema or warmth  Bradycardia and complete heart block with junctional escape rhythm  Resolved off metoprolol. Patient developed tachycardia into the 150's, so cardiology has resumed metoprolol at 25 mg bid (had been on 100 mg bid  Chronic Diastolic  CHF  Was started on bid IV lasix on admission.  Lasix held due to increase in creatinine 1.7. Currently euvolemic.  Dyspnea on exertion  -echo demonstrated grade 2 diastolic dysfunction; mild aortic stenosis.  -Cardiac enzymes neg X 3 and no abnormalities on EKG for acute ischemia.   Diabetes mellitus  Metformin held in the setting of acute renal failure. Currently controlled on sliding scale novolog  Chronic anemia   Hypokalemia  Corrected  Acute renal failure: holding lasix and cozaar and metformin. Please continue to monitor. Discussed with Dr. Irish Lack. Cardiology available to follow on Inpatient rehab if needed  Physical deconditioning  Has been accepted at inpatient rehab unit  Constipation: Getting laxatives  Code Status: Full   Consultants:  Cardiology  PM&R  studies:  8/14 2-D echocardiogram  - Left ventricle: Mild concentric hypertrophy. LVEF= 60% to 65%.  -(grade 2 diastolic dysfunction). -Left atrium: mildly dilated.   Cultures:  8/14 blood cultures x2 >>> negative 8/14 urine culture >>> Escherichia coli   Antibiotics:  Ceftriaxone 8/14 >>>  Levaquin 8/14 >>> 8/18  Vancomycin 8/14 >>>817   Discharge Exam: Filed Vitals:   11/22/13 0550  BP: 126/71  Pulse: 65  Temp: 98.4 F (36.9 C)  Resp: 18    General: comfortable Cardiovascular: RRR Respiratory: CTA Abd: obese, nontender Ext: nodularity/hyperpigmentation without erythema or warmth   Discharge Instructions   Diet - low sodium heart healthy    Complete by:  As directed      Diet Carb Modified    Complete by:  As directed      Walk with assistance    Complete by:  As directed             Medication List    STOP taking these medications       furosemide 40 MG tablet  Commonly known as:  LASIX     losartan 100 MG tablet  Commonly known as:  COZAAR     metFORMIN 500 MG tablet  Commonly known as:  GLUCOPHAGE      TAKE these medications       acetaminophen 325 MG tablet   Commonly known as:  TYLENOL  Take 2 tablets (650 mg total) by mouth every 6 (six) hours as needed for mild pain (or Fever >/= 101).     aspirin EC 81 MG tablet  Take 81 mg by mouth daily with breakfast.     bisacodyl 10 MG suppository  Commonly known as:  DULCOLAX  Place 1 suppository (10 mg total) rectally daily as needed for moderate constipation.     capsicum oleoresin 0.025 % cream  Commonly known as:  TRIXAICIN  Apply topically 3 (three) times daily as needed (muscle pain).     heparin 5000 UNIT/ML injection  Inject 1 mL (5,000 Units total) into the skin every 8 (eight) hours.     HYDROcodone-acetaminophen 5-325 MG per tablet  Commonly known as:  NORCO/VICODIN  Take 1-2 tablets by mouth every 4 (four) hours as needed for moderate pain.     insulin aspart 100 UNIT/ML injection  Commonly known as:  novoLOG  Inject 0-15 Units into the skin 3 (three) times daily with meals.     magnesium hydroxide 400 MG/5ML suspension  Commonly known as:  MILK OF MAGNESIA  Take 30 mLs by mouth daily as needed for mild constipation.     methocarbamol 500 MG tablet  Commonly known as:  ROBAXIN  Take 1 tablet (500 mg total) by mouth every 6 (six) hours as needed for muscle spasms.     metoprolol tartrate 25 MG tablet  Commonly known as:  LOPRESSOR  Take 1 tablet (25 mg total) by mouth 2 (two) times daily.     omeprazole 20 MG capsule  Commonly known as:  PRILOSEC  Take 20 mg by mouth 2 (two) times daily before a meal.     ondansetron 4 MG tablet  Commonly known as:  ZOFRAN  Take 1 tablet (4 mg total) by mouth every 6 (six) hours as needed for nausea.     senna-docusate 8.6-50 MG per tablet  Commonly known as:  Senokot-S  Take 2 tablets by mouth at bedtime.     traMADol 50 MG tablet  Commonly known as:  ULTRAM  Take by mouth every 6 (six) hours as needed.       Allergies  Allergen Reactions  . Oxycodone Hcl Nausea And Vomiting  . Penicillins Itching      The results of  significant diagnostics from this hospitalization (including imaging, microbiology, ancillary and laboratory) are listed below for reference.    Significant Diagnostic Studies: Dg Chest 2 View  11/17/2013   CLINICAL DATA:  Difficulty breathing  EXAM: CHEST  2 VIEW  COMPARISON:  August 02, 2013 chest radiograph and chest CT  FINDINGS: There is no edema or consolidation. The heart size and pulmonary vascularity are normal. No adenopathy. There is degenerative change in the thoracic spine.  IMPRESSION: No edema or consolidation.   Electronically Signed   By: Lowella Grip M.D.   On: 11/17/2013 11:27   Dg Lumbar Spine Complete  11/17/2013  CLINICAL DATA:  Weakness, CVA rib back pain  EXAM: LUMBAR SPINE - COMPLETE 4+ VIEW  COMPARISON:  None.  FINDINGS: Five views of lumbar spine submitted. There is disc space flattening with anterior spurring at L4-L5 and L5-S1 level. Narrowing of neural foramina noted bilaterally at L5-S1 level. Facet degenerative changes L3-L4 and L5 level. Anterior spurring upper endplate of L4 vertebral body. Degenerative changes lower thoracic spine. No acute fracture or subluxation.  IMPRESSION: No acute fracture or subluxation. Degenerative changes as described above.   Electronically Signed   By: Lahoma Crocker M.D.   On: 11/17/2013 11:38   Dg Pelvis 1-2 Views  11/17/2013   CLINICAL DATA:  Fall.  Pelvic pain  EXAM: PELVIS - 1-2 VIEW  COMPARISON:  None.  FINDINGS: There is no evidence of pelvic fracture or diastasis. No other pelvic bone lesions are seen. Mild degenerative changes seen involving the pubic symphysis and hip joints.  IMPRESSION: No acute findings.   Electronically Signed   By: Earle Gell M.D.   On: 11/17/2013 11:35    Microbiology: Recent Results (from the past 240 hour(s))  URINE CULTURE     Status: None   Collection Time    11/17/13 11:37 AM      Result Value Ref Range Status   Specimen Description URINE, CLEAN CATCH   Final   Special Requests NONE   Final    Culture  Setup Time     Final   Value: 11/18/2013 20:03     Performed at [place] [facility]     Final   Value: >=100,000 COLONIES/ML     Performed at [facility]   Culture     Final   Value: ESCHERICHIA COLI     Performed at [facility]   Report Status 11/21/2013 FINAL   Final   Organism ID, Bacteria ESCHERICHIA COLI   Final  CULTURE, BLOOD (ROUTINE X 2)     Status: None   Collection Time    11/17/13 12:16 PM      Result Value Ref Range Status   Specimen Description BLOOD RIGHT ANTECUBITAL   Final   Special Requests BOTTLES DRAWN AEROBIC AND ANAEROBIC 5CC   Final   Culture  Setup Time     Final   Value: 11/17/2013 14:17     Performed at [facility]   Culture     Final   Value:        BLOOD CULTURE RECEIVED NO GROWTH TO DATE CULTURE WILL BE HELD FOR 5 DAYS BEFORE ISSUING A FINAL NEGATIVE REPORT     Performed at [facility]   Report Status PENDING   Incomplete  CULTURE, BLOOD (ROUTINE X 2)     Status: None   Collection Time    11/17/13 12:16 PM      Result Value Ref Range Status   Specimen Description BLOOD LEFT HAND   Final   Special Requests BOTTLES DRAWN AEROBIC ONLY 3CC   Final   Culture  Setup Time     Final   Value: 11/17/2013 14:17     Performed at [facility]   Culture     Final   Value:        BLOOD CULTURE RECEIVED NO GROWTH TO DATE CULTURE WILL BE HELD FOR 5 DAYS BEFORE ISSUING A FINAL NEGATIVE REPORT     Performed at [facility]   Report Status PENDING   Incomplete  MRSA PCR  SCREENING     Status: None   Collection Time    11/19/13  4:54 PM      Result Value Ref Range Status   MRSA by PCR NEGATIVE  NEGATIVE Final   Comment:            The GeneXpert MRSA Assay (FDA     approved for NASAL specimens     only), is one component of a     comprehensive MRSA colonization     surveillance program. It is not     intended to diagnose MRSA     infection nor to guide or     monitor treatment for      MRSA infections.     Labs: Basic Metabolic Panel:  Recent Labs Lab 11/17/13 1036 11/18/13 0306 11/19/13 1406 11/20/13 0250 11/21/13 0303 11/22/13 0329  NA 135* 135*  --  139 139 137  K 3.2* 3.5*  --  4.3 4.8 4.9  CL 95* 98  --  104 105 103  CO2 23 24  --  23 21 23   GLUCOSE 178* 117*  --  96 98 106*  BUN 20 24*  --  17 17 17   CREATININE 1.00 1.47*  --  1.48* 1.63* 1.77*  CALCIUM 9.3 8.9  --  8.7 8.5 8.9  MG  --   --  2.1  --   --   --    Liver Function Tests:  Recent Labs Lab 11/17/13 1036 11/18/13 0306  AST 34 59*  ALT 35 34  ALKPHOS 86 75  BILITOT 1.1 0.7  PROT 8.8* 7.9  ALBUMIN 2.7* 2.3*    Recent Labs Lab 11/17/13 1036  LIPASE 22   No results found for this basename: AMMONIA,  in the last 168 hours CBC:  Recent Labs Lab 11/17/13 1036 11/18/13 0306 11/20/13 0250 11/22/13 0329  WBC 19.6* 13.9* 8.2 6.9  NEUTROABS 17.0*  --   --   --   HGB 11.1* 9.4* 8.7* 9.1*  HCT 33.5* 28.7* 27.5* 27.8*  MCV 75.3* 76.3* 77.5* 77.9*  PLT 356 363 427* 453*   Cardiac Enzymes:  Recent Labs Lab 11/17/13 1529 11/17/13 2056 11/18/13 0306  TROPONINI <0.30 <0.30 <0.30   BNP: BNP (last 3 results)  Recent Labs  08/02/13 1725 11/17/13 1035  PROBNP 601.2* 1875.0*   CBG:  Recent Labs Lab 11/21/13 1239 11/21/13 1616 11/21/13 2116 11/22/13 0614 11/22/13 1119  GLUCAP 128* 100* 112* 98 99   Signed:  [place] [facility]  [facility] 11/22/2013, 2:10 PM

## 2013-11-22 NOTE — Progress Notes (Signed)
Patient Profile:  68 year old female who has a past medical history of Edema; Palpitations; History of diastolic dysfunction; Arthritis; OA (osteoarthritis); PAC (premature atrial contraction); PVC's (premature ventricular contractions); Morbid obesity; HTN (hypertension); Tachycardia; SVT (supraventricular tachycardia); Aortic stenosis, mild (11/17/2013); and CHF (congestive heart failure) who presented to the hospital with worsening dyspnea on exertion, and fatigue.  On arrival was with transient complete heart block with junctional escape--->Was on a high dose BB as an OP--- > BB held---> bradycardia resolved and patient developed sinus tach up to the 150s--->Low dose BB restarted.   Pro BNP 1875. Cardiac enzymes negative x 3. 2D echo: normal LVF w/ EF 60-65%. Grade 2 diastolic dysfunction. Mild AS with valve area (VTI): 1.83 cm^2. Valve area (Vmax): 1.87 cm^2. No pericardial effusion.    SCr steadily increasing 1.48 ---> 1.63 --->1.77  Subjective: No complaints. Breathing much better today. No dyspnea at rest nor with ambulation around her room.   Objective: Vital signs in last 24 hours: Temp:  [97.7 F (36.5 C)-98.6 F (37 C)] 98.4 F (36.9 C) (08/19 0550) Pulse Rate:  [52-65] 65 (08/19 0550) Resp:  [13-20] 18 (08/19 0550) BP: (110-126)/(50-84) 126/71 mmHg (08/19 0550) SpO2:  [95 %-100 %] 98 % (08/19 0550) Weight:  [330 lb 4 oz (149.8 kg)] 330 lb 4 oz (149.8 kg) (08/19 0550) Last BM Date: 11/12/13  Intake/Output from previous day: 08/18 0701 - 08/19 0700 In: 1200 [P.O.:1200] Out: 900 [Urine:900] Intake/Output this shift:    Medications Current Facility-Administered Medications  Medication Dose Route Frequency Provider Last Rate Last Dose  . acetaminophen (TYLENOL) tablet 650 mg  650 mg Oral Q6H PRN Oswald Hillock, MD   650 mg at 11/20/13 0539   Or  . acetaminophen (TYLENOL) suppository 650 mg  650 mg Rectal Q6H PRN Oswald Hillock, MD      . aspirin EC tablet 81 mg  81 mg  Oral Q breakfast Oswald Hillock, MD   81 mg at 11/21/13 1034  . capsicum oleoresin (TRIXAICIN) 0.025 % cream   Topical TID PRN Ritta Slot, NP      . heparin injection 5,000 Units  5,000 Units Subcutaneous 3 times per day Oswald Hillock, MD   5,000 Units at 11/22/13 0545  . hydrALAZINE (APRESOLINE) injection 10 mg  10 mg Intravenous Q6H PRN Barton Dubois, MD      . HYDROcodone-acetaminophen (NORCO/VICODIN) 5-325 MG per tablet 1-2 tablet  1-2 tablet Oral Q4H PRN Samella Parr, NP   2 tablet at 11/21/13 0516  . insulin aspart (novoLOG) injection 0-15 Units  0-15 Units Subcutaneous TID WC Oswald Hillock, MD   2 Units at 11/21/13 1300  . magnesium hydroxide (MILK OF MAGNESIA) suspension 30 mL  30 mL Oral Daily PRN Dorothy Spark, MD   30 mL at 11/20/13 1450  . methocarbamol (ROBAXIN) tablet 500 mg  500 mg Oral Q6H PRN Barton Dubois, MD   500 mg at 11/22/13 0717  . metoprolol tartrate (LOPRESSOR) tablet 25 mg  25 mg Oral BID Dorothy Spark, MD   25 mg at 11/21/13 2110  . ondansetron (ZOFRAN) tablet 4 mg  4 mg Oral Q6H PRN Oswald Hillock, MD   4 mg at 11/20/13 7673   Or  . ondansetron (ZOFRAN) injection 4 mg  4 mg Intravenous Q6H PRN Oswald Hillock, MD      . pantoprazole (PROTONIX) EC tablet 40 mg  40 mg Oral Daily Barton Dubois, MD   [phone]  mg at 11/21/13 1035  . potassium chloride SA (K-DUR,KLOR-CON) CR tablet 40 mEq  40 mEq Oral Daily Barton Dubois, MD   40 mEq at 11/21/13 1034  . senna-docusate (Senokot-S) tablet 1-2 tablet  1-2 tablet Oral QHS PRN Sheila Oats, MD   2 tablet at 11/19/13 2142  . sodium chloride 0.9 % injection 3 mL  3 mL Intravenous Q12H Oswald Hillock, MD   3 mL at 11/21/13 2111    PE: General appearance: alert, cooperative, no distress and morbidly obese Neck: no JVD Lungs: clear to auscultation bilaterally Heart: regular rate and rhythm and 2/6 SM  Extremities: edematous LEE B/L lymphadenitic changes with superimposed cellulitis Pulses: 2+ and symmetric Skin: warm and  dry Neurologic: Grossly normal  Lab Results:   Recent Labs  11/20/13 0250 11/22/13 0329  WBC 8.2 6.9  HGB 8.7* 9.1*  HCT 27.5* 27.8*  PLT 427* 453*   BMET  Recent Labs  11/20/13 0250 11/21/13 0303 11/22/13 0329  NA 139 139 137  K 4.3 4.8 4.9  CL 104 105 103  CO2 23 21 23   GLUCOSE 96 98 106*  BUN 17 17 17   CREATININE 1.48* 1.63* 1.77*  CALCIUM 8.7 8.5 8.9   PT/INR No results found for this basename: LABPROT, INR,  in the last 72 hours Cholesterol  Recent Labs  11/22/13 0329  CHOL 109   Studies/Results: 2D echo 11/17/13 Study Conclusions  - Left ventricle: The cavity size was normal. There was mild concentric hypertrophy. Systolic function was normal. The estimated ejection fraction was in the range of 60% to 65%. Wall motion was normal; there were no regional wall motion abnormalities. Features are consistent with a pseudonormal left ventricular filling pattern, with concomitant abnormal relaxation and increased filling pressure (grade 2 diastolic dysfunction). - Aortic valve: There was very mild stenosis. Valve area (VTI): 1.83 cm^2. Valve area (Vmax): 1.87 cm^2. Valve area (Vmean): 2 cm^2. - Left atrium: The atrium was mildly dilated.   Assessment/Plan  Active Problems:   Venous stasis of lower extremity   HTN (hypertension)   DM type 2 (diabetes mellitus, type 2)   UTI (lower urinary tract infection)   Sepsis   Aortic stenosis   Cellulitis of leg, left   Complete heart block  1. Transient complete heart block with junctional escape - BB stopped on admission and then restarted for tachycardia at the lower dose ( Metoprolol 100---> 25 mg PO BID). Tolerating it well. NSR in the upper 60s.   2. SOB- Breathing better today. Echo showing normal LVF and diastolic dysfunction, mild AS. Cardiac enzymes neg x 3 She received IV lasix on admit for mildly elevated BNP with worsening of renal function. Chest xray is clear. Now off PO Lasix for worsening Crea.  Appears euvolemic. Continue to hold lasix.   3. AKI- SCr further increased to 1.77 (up from 1.00 on arrival). Continue to hold Lasix. She is not on an ACE/ARB or thiazide diuretic. No PRN NSAIDS.  She has been on antibiotics for her UTI. She was treated with ceftriaxone which is a beta lactam antibiotic and may play a role. She is no longer on this. Continue to follow renal function closely. With normal EF, can hydrate with IVFs.   4. UTI with sepsis- management per IM.  5. B/L lymphadenitic changes with superimposed cellulitis - management per IM  6. HTN - controlled. Continue low dose BB.   7. Constipation - management per IM.      LOS:  5 days    Brittainy M. Ladoris Gene 11/22/2013 8:19 AM  I have examined the patient and reviewed assessment and plan and discussed with patient.  Agree with above as stated.  Continue low dose beta blocker.  No issues with bradycardia on this dose.    VARANASI,JAYADEEP S.

## 2013-11-22 NOTE — Progress Notes (Signed)
I await insurance approval to admit pt to inpt rehab . I have discussed with Dr. Conley Canal. [phone]

## 2013-11-22 NOTE — Progress Notes (Signed)
Called cardiology concerning ST. Pt has no access and given additional 12.5 mg PO dose.  Called IV team to get get access for additional IV dose.  CIR aware and will hold taking patient until tomorrow.  Dr. Conley Canal called and made aware of situation. Pt resting with call bell within reach.  Will continue to monitor. Payton Emerald, RN

## 2013-11-23 ENCOUNTER — Encounter (HOSPITAL_COMMUNITY): Payer: Self-pay | Admitting: Interventional Cardiology

## 2013-11-23 ENCOUNTER — Inpatient Hospital Stay (HOSPITAL_COMMUNITY): Payer: Medicare Other | Admitting: Occupational Therapy

## 2013-11-23 ENCOUNTER — Inpatient Hospital Stay (HOSPITAL_COMMUNITY)
Admission: RE | Admit: 2013-11-23 | Discharge: 2013-11-29 | DRG: 945 | Disposition: A | Discharge status: Other Acute Inpatient Hospital | Payer: Medicare Other | Source: Intra-hospital | Attending: Physical Medicine & Rehabilitation | Admitting: Physical Medicine & Rehabilitation

## 2013-11-23 ENCOUNTER — Inpatient Hospital Stay (HOSPITAL_COMMUNITY): Payer: Medicare Other

## 2013-11-23 DIAGNOSIS — A419 Sepsis, unspecified organism: Secondary | ICD-10-CM | POA: Diagnosis present

## 2013-11-23 DIAGNOSIS — N390 Urinary tract infection, site not specified: Secondary | ICD-10-CM | POA: Diagnosis present

## 2013-11-23 DIAGNOSIS — I471 Supraventricular tachycardia, unspecified: Secondary | ICD-10-CM | POA: Diagnosis present

## 2013-11-23 DIAGNOSIS — I10 Essential (primary) hypertension: Secondary | ICD-10-CM | POA: Diagnosis present

## 2013-11-23 DIAGNOSIS — I491 Atrial premature depolarization: Secondary | ICD-10-CM | POA: Diagnosis present

## 2013-11-23 DIAGNOSIS — Z801 Family history of malignant neoplasm of trachea, bronchus and lung: Secondary | ICD-10-CM

## 2013-11-23 DIAGNOSIS — Z833 Family history of diabetes mellitus: Secondary | ICD-10-CM

## 2013-11-23 DIAGNOSIS — L03116 Cellulitis of left lower limb: Secondary | ICD-10-CM

## 2013-11-23 DIAGNOSIS — I495 Sick sinus syndrome: Secondary | ICD-10-CM | POA: Diagnosis present

## 2013-11-23 DIAGNOSIS — K5900 Constipation, unspecified: Secondary | ICD-10-CM | POA: Diagnosis present

## 2013-11-23 DIAGNOSIS — I442 Atrioventricular block, complete: Secondary | ICD-10-CM | POA: Diagnosis present

## 2013-11-23 DIAGNOSIS — I498 Other specified cardiac arrhythmias: Secondary | ICD-10-CM | POA: Diagnosis present

## 2013-11-23 DIAGNOSIS — Z7982 Long term (current) use of aspirin: Secondary | ICD-10-CM | POA: Diagnosis not present

## 2013-11-23 DIAGNOSIS — Z87891 Personal history of nicotine dependence: Secondary | ICD-10-CM

## 2013-11-23 DIAGNOSIS — L02419 Cutaneous abscess of limb, unspecified: Secondary | ICD-10-CM

## 2013-11-23 DIAGNOSIS — Z820 Family history of epilepsy and other diseases of the nervous system: Secondary | ICD-10-CM

## 2013-11-23 DIAGNOSIS — I5032 Chronic diastolic (congestive) heart failure: Secondary | ICD-10-CM

## 2013-11-23 DIAGNOSIS — Z886 Allergy status to analgesic agent status: Secondary | ICD-10-CM | POA: Diagnosis not present

## 2013-11-23 DIAGNOSIS — E119 Type 2 diabetes mellitus without complications: Secondary | ICD-10-CM | POA: Diagnosis present

## 2013-11-23 DIAGNOSIS — Z5189 Encounter for other specified aftercare: Secondary | ICD-10-CM | POA: Diagnosis present

## 2013-11-23 DIAGNOSIS — N179 Acute kidney failure, unspecified: Secondary | ICD-10-CM | POA: Diagnosis present

## 2013-11-23 DIAGNOSIS — M1990 Unspecified osteoarthritis, unspecified site: Secondary | ICD-10-CM | POA: Diagnosis present

## 2013-11-23 DIAGNOSIS — Z79899 Other long term (current) drug therapy: Secondary | ICD-10-CM

## 2013-11-23 DIAGNOSIS — R5381 Other malaise: Secondary | ICD-10-CM

## 2013-11-23 DIAGNOSIS — I509 Heart failure, unspecified: Secondary | ICD-10-CM | POA: Diagnosis present

## 2013-11-23 DIAGNOSIS — L03119 Cellulitis of unspecified part of limb: Secondary | ICD-10-CM | POA: Diagnosis present

## 2013-11-23 DIAGNOSIS — I890 Lymphedema, not elsewhere classified: Secondary | ICD-10-CM | POA: Diagnosis present

## 2013-11-23 DIAGNOSIS — I872 Venous insufficiency (chronic) (peripheral): Secondary | ICD-10-CM | POA: Diagnosis present

## 2013-11-23 DIAGNOSIS — I5030 Unspecified diastolic (congestive) heart failure: Secondary | ICD-10-CM | POA: Diagnosis not present

## 2013-11-23 DIAGNOSIS — R32 Unspecified urinary incontinence: Secondary | ICD-10-CM | POA: Diagnosis present

## 2013-11-23 DIAGNOSIS — A498 Other bacterial infections of unspecified site: Secondary | ICD-10-CM | POA: Diagnosis present

## 2013-11-23 DIAGNOSIS — Z6841 Body Mass Index (BMI) 40.0 and over, adult: Secondary | ICD-10-CM

## 2013-11-23 DIAGNOSIS — R531 Weakness: Secondary | ICD-10-CM | POA: Diagnosis present

## 2013-11-23 DIAGNOSIS — I878 Other specified disorders of veins: Secondary | ICD-10-CM | POA: Diagnosis present

## 2013-11-23 DIAGNOSIS — R0602 Shortness of breath: Secondary | ICD-10-CM

## 2013-11-23 DIAGNOSIS — I359 Nonrheumatic aortic valve disorder, unspecified: Secondary | ICD-10-CM | POA: Diagnosis present

## 2013-11-23 DIAGNOSIS — Z880 Allergy status to penicillin: Secondary | ICD-10-CM

## 2013-11-23 DIAGNOSIS — M549 Dorsalgia, unspecified: Secondary | ICD-10-CM | POA: Diagnosis present

## 2013-11-23 LAB — URINALYSIS, ROUTINE W REFLEX MICROSCOPIC
Bilirubin Urine: NEGATIVE
Glucose, UA: NEGATIVE mg/dL
Hgb urine dipstick: NEGATIVE
Ketones, ur: NEGATIVE mg/dL
Nitrite: NEGATIVE
Protein, ur: NEGATIVE mg/dL
Specific Gravity, Urine: 1.013 (ref 1.005–1.030)
Urobilinogen, UA: 0.2 mg/dL (ref 0.0–1.0)
pH: 6 (ref 5.0–8.0)

## 2013-11-23 LAB — URINE MICROSCOPIC-ADD ON

## 2013-11-23 LAB — GLUCOSE, CAPILLARY
Glucose-Capillary: 93 mg/dL (ref 70–99)
Glucose-Capillary: 99 mg/dL (ref 70–99)
Glucose-Capillary: 144 mg/dL — ABNORMAL HIGH (ref 70–99)

## 2013-11-23 LAB — BASIC METABOLIC PANEL
Anion gap: 10 (ref 5–15)
BUN: 17 mg/dL (ref 6–23)
CO2: 23 mEq/L (ref 19–32)
Calcium: 8.8 mg/dL (ref 8.4–10.5)
Chloride: 105 mEq/L (ref 96–112)
Creatinine, Ser: 1.8 mg/dL — ABNORMAL HIGH (ref 0.50–1.10)
GFR calc Af Amer: 32 mL/min — ABNORMAL LOW (ref >90)
GFR calc non Af Amer: 28 mL/min — ABNORMAL LOW (ref >90)
Glucose, Bld: 115 mg/dL — ABNORMAL HIGH (ref 70–99)
Potassium: 4.8 mEq/L (ref 3.7–5.3)
Sodium: 138 mEq/L (ref 137–147)

## 2013-11-23 LAB — CULTURE, BLOOD (ROUTINE X 2)
Culture: NO GROWTH
Culture: NO GROWTH

## 2013-11-23 MED ORDER — SENNOSIDES-DOCUSATE SODIUM 8.6-50 MG PO TABS: 2.0000 | ORAL_TABLET | Freq: Every day | ORAL | Status: DC

## 2013-11-23 MED ORDER — AMMONIUM LACTATE 12 % EX LOTN
TOPICAL_LOTION | Freq: Two times a day (BID) | CUTANEOUS | Status: DC
  Administered 2013-11-23 – 2013-11-25 (×5): via TOPICAL
  Administered 2013-11-26: 1 via TOPICAL
  Administered 2013-11-26 – 2013-11-28 (×5): via TOPICAL
  Filled 2013-11-23: qty 400

## 2013-11-23 MED ORDER — ASPIRIN EC 81 MG PO TBEC
81.0000 mg | DELAYED_RELEASE_TABLET | Freq: Every day | ORAL | Status: DC
  Administered 2013-11-24 – 2013-11-28 (×5): 81 mg via ORAL
  Filled 2013-11-23 (×7): qty 1

## 2013-11-23 MED ORDER — METOPROLOL TARTRATE 25 MG PO TABS
25.0000 mg | ORAL_TABLET | Freq: Two times a day (BID) | ORAL | Status: DC
  Administered 2013-11-23 – 2013-11-28 (×11): 25 mg via ORAL
  Filled 2013-11-23 (×14): qty 1

## 2013-11-23 MED ORDER — FLEET ENEMA 7-19 GM/118ML RE ENEM: 1.0000 | ENEMA | Freq: Once | RECTAL | Status: AC | PRN

## 2013-11-23 MED ORDER — HYDROCERIN EX CREA
TOPICAL_CREAM | Freq: Two times a day (BID) | CUTANEOUS | Status: DC
  Administered 2013-11-23 – 2013-11-26 (×7): via TOPICAL
  Administered 2013-11-27: 1 via TOPICAL
  Administered 2013-11-27 – 2013-11-28 (×3): via TOPICAL
  Filled 2013-11-23: qty 113

## 2013-11-23 MED ORDER — ONDANSETRON HCL 4 MG PO TABS: 4.0000 mg | ORAL_TABLET | Freq: Four times a day (QID) | ORAL | Status: DC | PRN

## 2013-11-23 MED ORDER — ONDANSETRON HCL 4 MG/2ML IJ SOLN: 4.0000 mg | Freq: Four times a day (QID) | INTRAMUSCULAR | Status: DC | PRN

## 2013-11-23 MED ORDER — DIPHENHYDRAMINE HCL 12.5 MG/5ML PO ELIX: 12.5000 mg | ORAL_SOLUTION | Freq: Four times a day (QID) | ORAL | Status: DC | PRN

## 2013-11-23 MED ORDER — PANTOPRAZOLE SODIUM 40 MG PO TBEC
40.0000 mg | DELAYED_RELEASE_TABLET | Freq: Every day | ORAL | Status: DC
  Administered 2013-11-24 – 2013-11-28 (×5): 40 mg via ORAL
  Filled 2013-11-23 (×5): qty 1

## 2013-11-23 MED ORDER — BISACODYL 10 MG RE SUPP: 10.0000 mg | Freq: Every day | RECTAL | Status: DC | PRN

## 2013-11-23 MED ORDER — SENNOSIDES-DOCUSATE SODIUM 8.6-50 MG PO TABS
2.0000 | ORAL_TABLET | Freq: Every day | ORAL | Status: DC
  Administered 2013-11-24 – 2013-11-25 (×2): 2 via ORAL
  Filled 2013-11-23 (×3): qty 2

## 2013-11-23 MED ORDER — TROLAMINE SALICYLATE 10 % EX CREA
TOPICAL_CREAM | Freq: Three times a day (TID) | CUTANEOUS | Status: DC
  Filled 2013-11-23: qty 85

## 2013-11-23 MED ORDER — ALUM & MAG HYDROXIDE-SIMETH 200-200-20 MG/5ML PO SUSP: 30.0000 mL | ORAL | Status: DC | PRN

## 2013-11-23 MED ORDER — PROCHLORPERAZINE EDISYLATE 5 MG/ML IJ SOLN
5.0000 mg | Freq: Four times a day (QID) | INTRAMUSCULAR | Status: DC | PRN
  Filled 2013-11-23: qty 2

## 2013-11-23 MED ORDER — INSULIN ASPART 100 UNIT/ML ~~LOC~~ SOLN
0.0000 [IU] | Freq: Three times a day (TID) | SUBCUTANEOUS | Status: DC
  Administered 2013-11-23 – 2013-11-28 (×3): 2 [IU] via SUBCUTANEOUS

## 2013-11-23 MED ORDER — TRAMADOL HCL 50 MG PO TABS: 50.0000 mg | ORAL_TABLET | Freq: Four times a day (QID) | ORAL | Status: DC | PRN

## 2013-11-23 MED ORDER — GUAIFENESIN-DM 100-10 MG/5ML PO SYRP: 5.0000 mL | ORAL_SOLUTION | Freq: Four times a day (QID) | ORAL | Status: DC | PRN

## 2013-11-23 MED ORDER — MUSCLE RUB 10-15 % EX CREA
TOPICAL_CREAM | Freq: Three times a day (TID) | CUTANEOUS | Status: DC
  Administered 2013-11-24: 1 via TOPICAL
  Administered 2013-11-24 – 2013-11-27 (×4): via TOPICAL
  Administered 2013-11-28: 1 via TOPICAL
  Administered 2013-11-28 – 2013-11-29 (×3): via TOPICAL
  Filled 2013-11-23 (×2): qty 85

## 2013-11-23 MED ORDER — METHOCARBAMOL 500 MG PO TABS: 500.0000 mg | ORAL_TABLET | Freq: Four times a day (QID) | ORAL | Status: DC | PRN

## 2013-11-23 MED ORDER — TRAZODONE HCL 50 MG PO TABS: 25.0000 mg | ORAL_TABLET | Freq: Every evening | ORAL | Status: DC | PRN

## 2013-11-23 MED ORDER — PROCHLORPERAZINE MALEATE 5 MG PO TABS
5.0000 mg | ORAL_TABLET | Freq: Four times a day (QID) | ORAL | Status: DC | PRN
  Filled 2013-11-23: qty 2

## 2013-11-23 MED ORDER — ENOXAPARIN SODIUM 40 MG/0.4ML ~~LOC~~ SOLN
40.0000 mg | SUBCUTANEOUS | Status: DC
  Administered 2013-11-23 – 2013-11-26 (×4): 40 mg via SUBCUTANEOUS
  Filled 2013-11-23 (×5): qty 0.4

## 2013-11-23 MED ORDER — METHOCARBAMOL 500 MG PO TABS
500.0000 mg | ORAL_TABLET | Freq: Four times a day (QID) | ORAL | Status: DC | PRN
  Administered 2013-11-23: 500 mg via ORAL
  Filled 2013-11-23: qty 1

## 2013-11-23 MED ORDER — PROCHLORPERAZINE 25 MG RE SUPP
12.5000 mg | Freq: Four times a day (QID) | RECTAL | Status: DC | PRN
  Filled 2013-11-23: qty 1

## 2013-11-23 MED ORDER — POLYETHYLENE GLYCOL 3350 17 G PO PACK
17.0000 g | PACK | Freq: Two times a day (BID) | ORAL | Status: AC
  Filled 2013-11-23 (×2): qty 1

## 2013-11-23 MED ORDER — ACETAMINOPHEN 325 MG PO TABS: 325.0000 mg | ORAL_TABLET | ORAL | Status: DC | PRN

## 2013-11-23 NOTE — Progress Notes (Signed)
Patient ID: [name] [name], female   DOB: 08-21-1945, 68 y.o.   MRN: [id number] Pt is admitted to room [facility] from [place]. Admission vital sign is stable.

## 2013-11-23 NOTE — Progress Notes (Signed)
Physical Medicine and Rehabilitation Consult  Reason for Consult: Deconditioning due to sepsis  Referring Physician: Dr. Thereasa Solo  HPI: [name] [name] is a 67 y.o. female with history of CHF, morbid obesity, SVT, PAC, PVS's, mild AS who was admitted on 11/17/13 with SOB, weeping edema BLE, fatigue as well as nausea and vomiting. She was treated for sepsis as work up with leucocytosis due to E coli UTI as well as BLE cellulitis. 2D echo with EF 60-655 with grade 2 diastolic dysfunction. Pro BNP-1875 and CXR without edema. She was treated with IV diuretics but has had worsening of renal status. She developed 2nd degree heart block and metoprolol d/c. Dr. Radford Pax was consulted for input and recommended restarting Lorsartan as well as cardiac monitoring till BB washed out. Cardiac enzymes X 3 negative. Heart block has resolved with tachycardia and low dose metoprolol resumed. PT evaluation done yesterday and patient noted to be deconditioned and limited due to back paina s well as poor posture. MD, PT recommending CIR.  Review of Systems  Constitutional: Positive for malaise/fatigue.  Eyes: Positive for blurred vision (due to cataracts).  Respiratory: Positive for shortness of breath (chronic).  Cardiovascular: Negative for chest pain and palpitations.  Musculoskeletal: Positive for back pain, joint pain, myalgias and neck pain.  Neurological: Positive for dizziness (with postional changes occasionally. ) and weakness.  Psychiatric/Behavioral: The patient does not have insomnia.   Past Medical History   Diagnosis  Date   .  Edema    .  Palpitations    .  History of diastolic dysfunction      Echo 11/1015 with diastolic dysfunction   .  Arthritis    .  OA (osteoarthritis)    .  PAC (premature atrial contraction)      per prior Holter   .  PVC's (premature ventricular contractions)      per prior Holter   .  Morbid obesity    .  HTN (hypertension)    .  Tachycardia      noted at 07/28/11 visit.  Started on beta blocker. Possible atrial flutter vs long PR tachycardia/AVNRT   .  SVT (supraventricular tachycardia)    .  Aortic stenosis, mild  11/17/2013   .  CHF (congestive heart failure)     Past Surgical History   Procedure  Laterality  Date   .  Cesarean section     .  US echocardiography   07/25/2008     EF 55-60%    Family History   Problem  Relation  Age of Onset   .  Alzheimer's disease  Mother    .  Lung cancer  Mother    .  COPD  Father    .  Diabetes  Maternal Uncle    .  Stroke  Neg Hx     Social History: Lives with family--daughter works. Independent with cane PTA. 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 She reports that she quit smoking about 28 years ago. Her smoking use included Cigarettes. She smoked 0.00 packs per day. She has never used smokeless tobacco. She reports that she does not drink alcohol or use illicit drugs.  Allergies   Allergen  Reactions   .  Oxycodone Hcl  Nausea And Vomiting   .  Penicillins  Itching    Medications Prior to Admission   Medication  Sig  Dispense  Refill   .  acetaminophen (TYLENOL) 325 MG tablet  Take 325-650 mg by  mouth every 6 (six) hours as needed for moderate pain.     Marland Kitchen  aspirin EC 81 MG tablet  Take 81 mg by mouth daily with breakfast.     .  furosemide (LASIX) 40 MG tablet  Take 40 mg by mouth daily as needed for edema.     Marland Kitchen  losartan (COZAAR) 100 MG tablet  Take 100 mg by mouth daily.     .  metFORMIN (GLUCOPHAGE) 500 MG tablet  Take 500 mg by mouth 2 (two) times daily with a meal.     .  metoprolol (LOPRESSOR) 100 MG tablet  Take 100 mg by mouth 2 (two) times daily.     Marland Kitchen  omeprazole (PRILOSEC) 20 MG capsule  Take 20 mg by mouth 2 (two) times daily before a meal.     .  traMADol (ULTRAM) 50 MG tablet  Take by mouth every 6 (six) hours as needed.      Home:  Home Living  Family/patient expects to be discharged to:: Private residence  Living Arrangements: Children  Available Help at Discharge:  Family;Friend(s);Available PRN/intermittently  Type of Home: Apartment  Home Access: Stairs to enter  Entrance Stairs-Number of Steps: 5  Entrance Stairs-Rails: Right;Left;Can reach both  Home Layout: One level  Home Equipment: Walker - 2 wheels;Cane - single point  Functional History:  Prior Function  Level of Independence: Independent with assistive device(s)  Comments: Does not drive anymore. Friends assist with grocery shopping.  Functional Status:  Mobility:  Bed Mobility  Overal bed mobility: Needs Assistance  Bed Mobility: Supine to Sit;Sit to Supine  Supine to sit: Min guard  Sit to supine: Min guard  General bed mobility comments: Pt requires increased time and cueing for bed rail use for support. She was able to transition to full sitting EOB without physical assist, only close guarding.  Transfers  Overall transfer level: Needs assistance  Equipment used: Rolling walker (2 wheeled)  Transfers: Sit to/from [facility]  Sit to Stand: Min assist  Stand pivot transfers: Min assist  General transfer comment: Pt with decreased safety awareness and is pulling up to stand from walker despite cues to push up from the bed. Pt rocking and uses momentum to power-up to standing. During SPT pt letting go of walker to reach for [facility] and has decreased awareness of lines.  Ambulation/Gait  Ambulation/Gait assistance: Min assist  Ambulation Distance (Feet): 15 Feet  Assistive device: Rolling walker (2 wheeled)  Gait Pattern/deviations: Step-to pattern;Decreased stride length;Trendelenburg;Trunk flexed  Gait velocity: Decreased  Gait velocity interpretation: Below normal speed for age/gender  General Gait Details: Pt again demonstrates poor safety awareness and leans her forearms on the walker. Therapist cueing pt for improved posture and explains that pt is safer with hands on the walker, however pt makes no corrective changes, stating her back is hurting and she cannot stand  up straight.   ADL:   Cognition:  Cognition  Overall Cognitive Status: Within Functional Limits for tasks assessed  Orientation Level: Oriented X4  Cognition  Arousal/Alertness: Awake/alert  Behavior During Therapy: WFL for tasks assessed/performed  Overall Cognitive Status: Within Functional Limits for tasks assessed  Blood pressure 109/52, pulse 70, temperature 98.9 F (37.2 C), temperature source Oral, resp. rate 17, height 5\' 11"  (1.803 m), weight 153.3 kg (337 lb 15.4 oz), SpO2 95.00%.  Physical Exam  Nursing note and vitals reviewed.  Constitutional: She is oriented to person, place, and time. She appears well-developed and well-nourished.  Morbidly  obese  HENT:  Head: Normocephalic and atraumatic.  Eyes: Conjunctivae are normal. Pupils are equal, round, and reactive to light.  Neck: Normal range of motion. Neck supple.  Cardiovascular: Normal rate and regular rhythm.  Murmur heard.  Respiratory: Effort normal and breath sounds normal. No respiratory distress. She has no wheezes.  GI: Soft. Bowel sounds are normal. She exhibits no distension. There is no tenderness.  Musculoskeletal:  Lymphedema BLE --L>R with resolving erythema left shin. Ichthyosis bilateral shins. LUE with limitations at shoulder due to RTC pathology.  Neurological: She is alert and oriented to person, place, and time.  UES: 5/5 prox to distal. LES: 3+ HF, 4 KE and 4/5 ankles bilaterally. No gross sensory deficits.  Skin: Skin is warm and dry.  Psychiatric: Her behavior is normal. Judgment and thought content normal. Her affect is blunt.   Results for orders placed during the hospital encounter of 11/17/13 (from the past 24 hour(s))   GLUCOSE, CAPILLARY Status: Abnormal    Collection Time    11/20/13 8:34 AM   Result  Value  Ref Range    Glucose-Capillary  122 (*)  70 - 99 mg/dL   GLUCOSE, CAPILLARY Status: None    Collection Time    11/20/13 12:19 PM   Result  Value  Ref Range    Glucose-Capillary   94  70 - 99 mg/dL   GLUCOSE, CAPILLARY Status: Abnormal    Collection Time    11/20/13 5:12 PM   Result  Value  Ref Range    Glucose-Capillary  105 (*)  70 - 99 mg/dL   GLUCOSE, CAPILLARY Status: Abnormal    Collection Time    11/20/13 9:18 PM   Result  Value  Ref Range    Glucose-Capillary  141 (*)  70 - 99 mg/dL   BASIC METABOLIC PANEL Status: Abnormal    Collection Time    11/21/13 3:03 AM   Result  Value  Ref Range    Sodium  139  137 - 147 mEq/L    Potassium  4.8  3.7 - 5.3 mEq/L    Chloride  105  96 - 112 mEq/L    CO2  21  19 - 32 mEq/L    Glucose, Bld  98  70 - 99 mg/dL    BUN  17  6 - 23 mg/dL    Creatinine, Ser  1.63 (*)  0.50 - 1.10 mg/dL    Calcium  8.5  8.4 - 10.5 mg/dL    GFR calc non Af Amer  32 (*)  >90 mL/min    GFR calc Af Amer  37 (*)  >90 mL/min    Anion gap  13  5 - 15    No results found.  Assessment/Plan:  Diagnosis: deconditioned after multiple medical issues and fall  1. Does the need for close, 24 hr/day medical supervision in concert with the patient's rehab needs make it unreasonable for this patient to be served in a less intensive setting? Yes 2. Co-Morbidities requiring supervision/potential complications: Morbid obesity, chronic low back pain, severe lymphedema, AS 3. Due to bladder management, bowel management, safety, skin/wound care, disease management, medication administration, pain management and patient education, does the patient require 24 hr/day rehab nursing? Yes 4. Does the patient require coordinated care of a physician, rehab nurse, PT (1-2 hrs/day, 5 days/week) and OT (1-2 hrs/day, 5 days/week) to address physical and functional deficits in the context of the above medical diagnosis(es)? Yes Addressing deficits in the following  areas: balance, endurance, locomotion, strength, transferring, bowel/bladder control, bathing, dressing, feeding, grooming, toileting and psychosocial support 5. Can the patient actively participate in an  intensive therapy program of at least 3 hrs of therapy per day at least 5 days per week? Yes 6. The potential for patient to make measurable gains while on inpatient rehab is excellent 7. Anticipated functional outcomes upon discharge from inpatient rehab are modified independent with PT, modified independent with OT, n/a with SLP. 8. Estimated rehab length of stay to reach the above functional goals is: 7 days 9. Does the patient have adequate social supports to accommodate these discharge functional goals? Yes 10. Anticipated D/C setting: Home 11. Anticipated post D/C treatments: [place] therapy 12. Overall Rehab/Functional Prognosis: excellent RECOMMENDATIONS:  This patient's condition is appropriate for continued rehabilitative care in the following setting: CIR  Patient has agreed to participate in recommended program. Yes  Note that insurance prior authorization may be required for reimbursement for recommended care.  Comment: Given the patient's ongoing medical issues, persistent back pain, morbid obesity, and disposition (will be alone during day/ 5STE), CIR is the best fit to achieve this patient's functional and physical goals. Rehab Admissions Coordinator to follow up.  Thanks,  Meredith Staggers, MD, Mellody Drown  11/21/2013  Revision History.Marland KitchenMarland Kitchen

## 2013-11-23 NOTE — Progress Notes (Addendum)
Patient did not get transferred to CIR yesterday due to sinus tachycardia.  RN had held morning metoprolol dose, due to nausea. It was eventually given, as well as an additional 12.5 mg and 5 mg IV. Has been NSR with rate about 70 since yesterday evening.  Pt feels well today with less nausea. Had small BM. Exam unchanged. Creat 1.8. Have paged cardiology to decide whether to transfer to CIR.  Doree Barthel, MD [facility] [phone]  Addendum: d/w Dr. Irish Lack.  Ok to go to [facility] Would monitor creatinine for the next few days until stabilized, then considering resuming lasix.  Doree Barthel, MD

## 2013-11-23 NOTE — Progress Notes (Signed)
PMR Admission Coordinator Pre-Admission Assessment   Patient: [name] [name] is an 68 y.o., female MRN: [id number] DOB: November 27, 1945 Height: 5\' 11"  (180.3 cm) Weight: 149.8 kg (330 lb 4 oz)                                                                                                                                                  Insurance Information HMO: yes    PPO:      PCP:      IPA:      80/20:      OTHER: medicare replacement policy PRIMARY: Blue Medicare      Policy#: [id number]      Subscriber: pt CM Name: [name] H.      Phone#: [phone]     Fax#: [phone] Pre-Cert#: [id number]      Employer: retired Benefits:  Phone #: [phone]     Name: 11/22/13 Eff. Date: 04/06/13     Deduct: none      Out of Pocket Max: $4900      Life Max: none CIR: $285 per day days 1-6      SNF: no copay days 1-20; $60 copay days  21-100 Outpatient: $40 per visit     Co-Pay: no visit limit Home Health: 100%      Co-Pay: no visit limit DME: 80%     Co-Pay: 20% Providers: in network  SECONDARY: none         Medicaid Application Date:       Case Manager:  Disability Application Date:       Case Worker:    Emergency Facilities manager Information     Name  Relation  Home  Work  [employer]  Daughter  [phone]         [name] [name]  Daughter  [phone]           Current Medical History  Patient Admitting Diagnosis:deconditioned after multiple medical issues and fall   History of Present Illness:[name] [name] is a 68 y.o. female with history of CHF, morbid obesity, SVT, PAC, PVS's, mild AS who was admitted on 11/17/13 with SOB, weeping edema BLE, fatigue as well as nausea and vomiting. She was treated for sepsis as work up with leucocytosis due to E coli UTI as well as BLE cellulitis. 2D echo with EF 60-655 with grade 2 diastolic dysfunction. Pro BNP-1875 and CXR without edema. She was treated with IV diuretics but has had worsening of renal status. Creat 1.7 and  pt currently euvolemic.   She developed 2nd degree heart block and metoprolol d/c. Dr. Radford Pax was consulted for input and recommended restarting Lorsartan as well as cardiac monitoring till BB washed out. Cardiac enzymes X 3 negative. Heart block has resolved with tachycardia and low dose metoprolol resumed.    Patient developed tachycardia in the 150s, so  cardiology resumed metoprolol at 25 mg bid ( had been on 100 mg bid). Nausea in am of 9/19 and metoprolol held until after lunch. Heartrate elevated and an additional 12.5 mg given.   Diabetes mellitus with metformin held in the setting of acute renal failure. Currently controlled on sliding scale novolog.     Past Medical History  Past Medical History   Diagnosis  Date   .  Edema     .  Palpitations     .  History of diastolic dysfunction         Echo 12/5619 with diastolic dysfunction   .  Arthritis     .  OA (osteoarthritis)     .  PAC (premature atrial contraction)         per prior Holter   .  PVC's (premature ventricular contractions)         per prior Holter   .  Morbid obesity     .  HTN (hypertension)     .  Tachycardia         noted at 07/28/11 visit. Started on beta blocker. Possible atrial flutter vs long PR tachycardia/AVNRT   .  SVT (supraventricular tachycardia)     .  Aortic stenosis, mild  11/17/2013   .  CHF (congestive heart failure)        Family History  family history includes Alzheimer's disease in her mother; COPD in her father; Diabetes in her maternal uncle; Lung cancer in her mother. There is no history of Stroke.   Prior Rehab/Hospitalizations: none   Current Medications  Current facility-administered medications:acetaminophen (TYLENOL) suppository 650 mg, 650 mg, Rectal, Q6H PRN, Oswald Hillock, MD;  acetaminophen (TYLENOL) tablet 650 mg, 650 mg, Oral, Q6H PRN, Oswald Hillock, MD, 650 mg at 11/20/13 3086;  aspirin EC tablet 81 mg, 81 mg, Oral, Q breakfast, Oswald Hillock, MD, 81 mg at 11/22/13 1154;   bisacodyl (DULCOLAX) suppository 10 mg, 10 mg, Rectal, Once, Delfina Redwood, MD capsicum oleoresin (TRIXAICIN) 0.025 % cream, , Topical, TID PRN, Ritta Slot, NP;  heparin injection 5,000 Units, 5,000 Units, Subcutaneous, 3 times per day, Oswald Hillock, MD, 5,000 Units at 11/22/13 1443;  hydrALAZINE (APRESOLINE) injection 10 mg, 10 mg, Intravenous, Q6H PRN, Barton Dubois, MD;  HYDROcodone-acetaminophen (NORCO/VICODIN) 5-325 MG per tablet 1-2 tablet, 1-2 tablet, Oral, Q4H PRN, Samella Parr, NP, 2 tablet at 11/21/13 0516 insulin aspart (novoLOG) injection 0-15 Units, 0-15 Units, Subcutaneous, TID WC, Oswald Hillock, MD, 2 Units at 11/21/13 1300;  magnesium hydroxide (MILK OF MAGNESIA) suspension 30 mL, 30 mL, Oral, Daily PRN, Dorothy Spark, MD, 30 mL at 11/20/13 1450;  methocarbamol (ROBAXIN) tablet 500 mg, 500 mg, Oral, Q6H PRN, Barton Dubois, MD, 500 mg at 11/22/13 0717 metoprolol tartrate (LOPRESSOR) tablet 25 mg, 25 mg, Oral, BID, Dorothy Spark, MD, 25 mg at 11/22/13 1340;  ondansetron (ZOFRAN) injection 4 mg, 4 mg, Intravenous, Q6H PRN, Oswald Hillock, MD;  ondansetron (ZOFRAN) tablet 4 mg, 4 mg, Oral, Q6H PRN, Oswald Hillock, MD, 4 mg at 11/22/13 1154;  pantoprazole (PROTONIX) EC tablet 40 mg, 40 mg, Oral, Daily, Barton Dubois, MD, 40 mg at 11/22/13 1154 potassium chloride SA (K-DUR,KLOR-CON) CR tablet 40 mEq, 40 mEq, Oral, Daily, Barton Dubois, MD, 40 mEq at 11/22/13 1340;  senna-docusate (Senokot-S) tablet 2 tablet, 2 tablet, Oral, QHS, Delfina Redwood, MD;  sodium chloride 0.9 % injection 3 mL, 3 mL, Intravenous, Q12H, Frederich Chick  Toney Sang, MD, 3 mL at 11/22/13 1155   Patients Current Diet: Carb Control   Precautions / Restrictions Precautions Precautions: Fall Precaution Comments: Pt with poor safety awareness Restrictions Weight Bearing Restrictions: No    Prior Activity Level Community (5-7x/wk): pt active church member, bible study, Office manager / [place] Devices/Equipment: Cane (specify quad or straight);Walker (specify type);Eyeglasses (straight cane, front wheeled walker) Home Equipment: Walker - 2 wheels;Cane - single point   Prior Functional Level Prior Function Level of Independence: Independent with assistive device(s) Comments: Does not drive anymore. Friends assist with grocery shopping.  Pt reports she was able to go to [employer] 2 wks PTA.   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   Current Functional Level Cognition   Overall Cognitive Status: Within Functional Limits for tasks assessed Orientation Level: Oriented X4     Extremity Assessment (includes Sensation/Coordination)          ADLs   Overall ADL's : Needs assistance/impaired Eating/Feeding: Independent;Sitting;Bed level Grooming: Wash/dry hands;Minimal assistance;Standing Upper Body Bathing: Minimal assitance;Sitting Lower Body Bathing: Moderate assistance;Sit to/from stand Upper Body Dressing : Minimal assistance;Sitting Lower Body Dressing: Moderate assistance;Sit to/from stand Toilet Transfer: Minimal assistance;Ambulation;RW Toilet Transfer Details (indicate cue type and reason): requires mod verbal cues for walker safety   Toileting- Clothing Manipulation and Hygiene: Moderate assistance;Sit to/from stand Functional mobility during ADLs: Minimal assistance;Rolling walker General ADL Comments: Pt fatigues, requiring rest breaks during eval, but is very motivated to return to modified independent level      Mobility   Overal bed mobility: Needs Assistance Bed Mobility: Supine to Sit;Sit to Supine Supine to sit: Supervision Sit to supine: Supervision General bed mobility comments: Pt requires increased time and cueing for bed rail use for support. She was able to transition to full sitting EOB without physical assist, only close guarding.      Transfers   Overall transfer level: Needs assistance Equipment used: Rolling walker (2  wheeled) Transfers: Sit to/from Stand Sit to Stand: Min assist Stand pivot transfers: Min assist General transfer comment: Pt uses rocking momentum to come to stand. Assist to bring hips up.      Ambulation / Gait / Stairs / Wheelchair Mobility   Ambulation/Gait Ambulation/Gait assistance: Museum/gallery curator (Feet): 25 Feet Assistive device: Rolling walker (2 wheeled) Gait Pattern/deviations: Step-through pattern;Decreased step length - right;Decreased step length - left;Trendelenburg;Trunk flexed Gait velocity: Decreased Gait velocity interpretation: Below normal speed for age/gender General Gait Details: Verbal cues to stand more erect. Able to keep hands on walker and not prop on forearms but with incr flexion as distance incr. Pt begins sitting on bed prior to bring walker all the way back to bed.      Posture / Balance       Special needs/care consideration  Bowel mgmt: no BM since admission Bladder mgmt: continent. Suppository given 11/22/13 Diabetic mgmt yes metformin on hold. Using sliding scale      Previous Home Environment Living Arrangements: Children;Other (Comment) (pt lives with daughter, Renaye Rakers)  Lives With: Daughter Available Help at Discharge: Family;Friend(s);Available PRN/intermittently Type of Home: Apartment Home Layout: One level Home Access: Stairs to enter Entrance Stairs-Rails: Right;Left;Can reach both Entrance Stairs-Number of Steps: 5 Bathroom Shower/Tub: Product/process development scientist: Standard Bathroom Accessibility: Yes How Accessible: Accessible via walker Home Care Services: No   Discharge Living Setting Plans for Discharge Living Setting: Apartment Type of Home at Discharge: Apartment Discharge Home Layout:  One level Discharge Home Access: Stairs to enter Entrance Stairs-Rails: Right;Left;Can reach both Entrance Stairs-Number of Steps: 5 Discharge Bathroom Shower/Tub: Tub/shower unit;Curtain Discharge Bathroom  Toilet: Standard Discharge Bathroom Accessibility: Yes How Accessible: Accessible via walker Does the patient have any problems obtaining your medications?: No   Social/Family/Support Systems Patient Roles: Parent;Volunteer Contact Information: Alessandra Grout, daughters Anticipated Caregiver: daughters prn Anticipated Caregiver's Contact Information: see above Ability/Limitations of Caregiver: daughter works days Caregiver Availability: Intermittent Discharge Plan Discussed with Primary Caregiver: Yes Is Caregiver In Agreement with Plan?: Yes Does Caregiver/Family have Issues with Lodging/Transportation while Pt is in Rehab?: No   Goals/Additional Needs Patient/Family Goal for Rehab: Mod I with PT and OT Expected length of stay: ELOS 7 days Pt/Family Agrees to Admission and willing to participate: Yes Program Orientation Provided & Reviewed with Pt/Caregiver Including Roles  & Responsibilities: Yes   Decrease burden of Care through IP rehab admission: n/a   Possible need for SNF placement upon discharge: n/a   Patient Condition: This patient's condition remains as documented in the consult dated 11/21/13, in which the Rehabilitation Physician determined and documented that the patient's condition is appropriate for intensive rehabilitative care in an inpatient rehabilitation facility. Will admit to inpatient rehab today.   Preadmission Screen Completed By:  Cleatrice Burke, 11/22/2013 3:18 PM ______________________________________________________________________    Discussed status with Dr. Naaman Plummer on 11/22/2013 at  1506 and received telephone approval for admission today.   Admission Coordinator:  Cleatrice Burke, time 9563 Date 11/22/2013.    Patient did not get transferred to CIR on 8/19 due to sinus tachycardia. RN had held morning metoprolol dose, due to nausea. It was eventually given, as well as an additional 12.5 mg and 5 mg IV. Has been NSR with rate  about 70 since yesterday evening. Pt feels well today  11/23/13 with less nausea. Had small BM. Exam unchanged. Creat 1.8   Discussed with Dr. Letta Pate on 11/23/13 at 1000 and received telephone approval for admission today.   Admission Coordinator: [facility], time 1000 date 11/23/2013.  Cosigned by: Charlett Blake, MD    [11/23/2013 10:54 AM]  Revision History...     Date/Time User Action   11/23/2013 10:54 AM Charlett Blake, MD Cosign   11/23/2013 10:01 AM Cleatrice Burke, RN Addend   11/23/2013 10:01 AM Cleatrice Burke, RN Addend   11/22/2013 4:03 PM Meredith Staggers, MD Cosign   11/22/2013 4:01 PM Cleatrice Burke, RN Sign  View Details Report

## 2013-11-23 NOTE — Progress Notes (Signed)
Patient is having urinary frequency, denies discomfort, no odor detected. Will continue to monitor. Oval Linsey, RN

## 2013-11-23 NOTE — H&P (Signed)
Physical Medicine and Rehabilitation Admission H&P      Chief Complaint   Patient presents with   .  Deconditioning due to sepsis      HPI: [name] [name] is a 68 y.o. female with history of CHF, morbid obesity, SVT, PAC, PVS's, mild AS who was admitted on 11/17/13 with SOB, weeping edema BLE, fatigue as well as nausea and vomiting. She was treated for sepsis as work up with leucocytosis due to E coli UTI as well as BLE cellulitis. 2D echo with EF 60-655 with grade 2 diastolic dysfunction. Pro BNP-1875 and CXR without edema. She was treated with IV diuretics but has had worsening of renal status. She developed 2nd degree heart block and metoprolol d/c. Dr. Radford Pax was consulted for input and recommended restarting Lorsartan as well as cardiac monitoring till BB washed out. Cardiac enzymes X 3 negative. Heart block has resolved with tachycardia and low dose metoprolol resumed. Serum Cr on steady rise to 1.80 therefore lasix on hold.  Antibiotics discontinued yesterday.  PT evaluation done yesterday and patient noted to be deconditioned and limited due to back pain as well as poor posture  The patient denies any current chest pain or shortness of breath. She does have right sided low room pain. She indicates that she felt prior to admission   Review of Systems  HENT: Negative for hearing loss.   Eyes: Negative for blurred vision and double vision.  Respiratory: Negative for shortness of breath and wheezing.   Cardiovascular: Negative for chest pain and palpitations.  Gastrointestinal: Positive for heartburn, nausea (since admission--a little better), abdominal pain and constipation.  Genitourinary: Negative for dysuria.  Musculoskeletal: Positive for back pain and joint pain (chronic knee pain).  Neurological: Positive for weakness. Negative for dizziness and headaches.  Psychiatric/Behavioral: The patient does not have insomnia.      Past Medical History   Diagnosis  Date   .  Edema      .  Palpitations     .  History of diastolic dysfunction         Echo 12/4707 with diastolic dysfunction   .  Arthritis     .  OA (osteoarthritis)     .  PAC (premature atrial contraction)         per prior Holter   .  PVC's (premature ventricular contractions)         per prior Holter   .  Morbid obesity     .  HTN (hypertension)     .  Tachycardia         noted at 07/28/11 visit. Started on beta blocker. Possible atrial flutter vs long PR tachycardia/AVNRT   .  SVT (supraventricular tachycardia)     .  Aortic stenosis, mild  11/17/2013   .  CHF (congestive heart failure)      Past Surgical History   Procedure  Laterality  Date   .  Cesarean section       .  US echocardiography    07/25/2008       EF 55-60%    Family History   Problem  Relation  Age of Onset   .  Alzheimer's disease  Mother     .  Lung cancer  Mother     .  COPD  Father     .  Diabetes  Maternal Uncle     .  Stroke  Neg Hx      Social History: Lives with family--daughter works.  Independent with cane PTA. 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 She reports that she quit smoking about 28 years ago. Her smoking use included Cigarettes. She smoked 0.00 packs per day. She has never used smokeless tobacco. She reports that she does not drink alcohol or use illicit drugs.       Allergies   Allergen  Reactions   .  Oxycodone Hcl  Nausea And Vomiting   .  Penicillins  Itching       Medications Prior to Admission   Medication  Sig  Dispense  Refill   .  acetaminophen (TYLENOL) 325 MG tablet  Take 325-650 mg by mouth every 6 (six) hours as needed for moderate pain.          Marland Kitchen  aspirin EC 81 MG tablet  Take 81 mg by mouth daily with breakfast.          .  furosemide (LASIX) 40 MG tablet  Take 40 mg by mouth daily as needed for edema.         Marland Kitchen  losartan (COZAAR) 100 MG tablet  Take 100 mg by mouth daily.         .  metFORMIN (GLUCOPHAGE) 500 MG tablet  Take 500 mg by mouth 2 (two) times daily with a  meal.         .  metoprolol (LOPRESSOR) 100 MG tablet  Take 100 mg by mouth 2 (two) times daily.         Marland Kitchen  omeprazole (PRILOSEC) 20 MG capsule  Take 20 mg by mouth 2 (two) times daily before a meal.         .  traMADol (ULTRAM) 50 MG tablet  Take by mouth every 6 (six) hours as needed.            Home: Home Living Family/patient expects to be discharged to:: Private residence Living Arrangements: Children Available Help at Discharge: Family;Friend(s);Available PRN/intermittently Type of Home: Apartment Home Access: Stairs to enter Entrance Stairs-Number of Steps: 5 Entrance Stairs-Rails: Right;Left;Can reach both Home Layout: One level Home Equipment: Walker - 2 wheels;Cane - single point    Functional History: Prior Function Level of Independence: Independent with assistive device(s) Comments: Does not drive anymore. Friends assist with grocery shopping.  Pt reports she was able to go to [employer] 2 wks PTA.   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 She was able to transition to full sitting EOB without physical assist, only close guarding.  Transfers Overall transfer level: Needs assistance Equipment used: Rolling walker (2 wheeled) Transfers: Sit to/from Stand Sit to Stand: Min assist Stand pivot transfers: Min assist General transfer comment: Pt uses rocking momentum to come to stand. Assist to bring hips up. Ambulation/Gait Ambulation/Gait assistance: Min assist Ambulation Distance (Feet): 25 Feet Assistive device: Rolling walker (2 wheeled) Gait Pattern/deviations: Step-through pattern;Decreased step length - right;Decreased step length - left;Trendelenburg;Trunk flexed Gait  velocity: Decreased Gait velocity interpretation: Below normal speed for age/gender General Gait Details: Verbal cues to stand more erect. Able to keep hands on walker and not prop on forearms but with incr flexion as distance incr. Pt begins sitting on bed prior to bring walker all the  way back to bed.    ADL: ADL Overall ADL's : Needs assistance/impaired Eating/Feeding: Independent;Sitting;Bed level Grooming: Wash/dry hands;Minimal assistance;Standing Upper Body Bathing: Minimal assitance;Sitting Lower Body Bathing: Moderate assistance;Sit to/from stand Upper Body Dressing : Minimal assistance;Sitting Lower Body Dressing: Moderate assistance;Sit to/from stand Toilet Transfer: Minimal assistance;Ambulation;RW Toilet Transfer Details (indicate cue type and reason): requires mod verbal cues for walker safety   Toileting- Clothing Manipulation and Hygiene: Moderate assistance;Sit to/from stand Functional mobility during ADLs: Minimal assistance;Rolling walker General ADL Comments: Pt fatigues, requiring rest breaks during eval, but is very motivated to return to modified independent level    Cognition: Cognition Overall Cognitive Status: Within Functional Limits for tasks assessed Orientation Level: Oriented X4 Cognition Arousal/Alertness: Awake/alert Behavior During Therapy: WFL for tasks assessed/performed Overall Cognitive Status: Within Functional Limits for tasks assessed   Physical Exam: Blood pressure 126/71, pulse 65, temperature 98.4 F (36.9 C), temperature source Oral, resp. rate 18, height 5' 11" (1.803 m), weight 149.8 kg (330 lb 4 oz), SpO2 98.00%. Physical Exam Nursing note and vitals reviewed.   Constitutional: She is oriented to person, place, and time. She appears well-developed and well-nourished.  Morbidly obese   HENT:   Head: Normocephalic and atraumatic.   Eyes: Conjunctivae are normal. Pupils are equal, round, and reactive to light.   Neck: Normal range  of motion. Neck supple.   Cardiovascular: Normal rate and regular rhythm.   Murmur heard.   Respiratory: Effort normal and breath sounds normal. No respiratory distress. She has no wheezes.   GI: Soft. Bowel sounds are normal. She exhibits no distension. There is no tenderness.  Musculoskeletal: Tenderness right 11th and 12th ribs posterior lateral Lymphedema BLE --L>R with resolving erythema left shin. Ichthyosis bilateral shins.    LUE with limitations at shoulder due to RTC pathology. Positive drop arm test Neurological: She is alert and oriented to person, place, and time.  UES: 5/5 prox to distal. LES: 3+ HF, 4 KE and 4/5 ankles bilaterally. No gross sensory deficits.  Skin: Skin is warm and dry.  Psychiatric: Her behavior is normal. Judgment and thought content normal. Her affect is blunt       Results for orders placed during the hospital encounter of 11/17/13 (from the past 48 hour(s))   GLUCOSE, CAPILLARY     Status: Abnormal     Collection Time      11/20/13  5:12 PM       Result  Value  Ref Range     Glucose-Capillary  105 (*)  70 - 99 mg/dL   GLUCOSE, CAPILLARY     Status: Abnormal     Collection Time      11/20/13  9:18 PM       Result  Value  Ref Range     Glucose-Capillary  141 (*)  70 - 99 mg/dL   BASIC METABOLIC PANEL     Status: Abnormal     Collection Time      11/21/13  3:03 AM       Result  Value  Ref Range     Sodium  139   137 - 147 mEq/L     Potassium  4.8   3.7 - 5.3 mEq/L     Chloride  105   96 - 112 mEq/L     CO2  21   19 - 32 mEq/L     Glucose, Bld  98   70 - 99 mg/dL     BUN  17  6 - 23 mg/dL     Creatinine, Ser  1.63 (*)  0.50 - 1.10 mg/dL     Calcium  8.5   8.4 - 10.5 mg/dL     GFR calc non Af Amer  32 (*)  >90 mL/min     GFR calc Af Amer  37 (*)  >90 mL/min     Comment:  (NOTE)        The eGFR has been calculated using the CKD EPI equation.        This calculation has not been validated in all clinical situations.        eGFR's persistently  <90 mL/min signify possible Chronic Kidney        Disease.     Anion gap  13   5 - 15   GLUCOSE, CAPILLARY     Status: Abnormal     Collection Time      11/21/13  8:23 AM       Result  Value  Ref Range     Glucose-Capillary  100 (*)  70 - 99 mg/dL   GLUCOSE, CAPILLARY     Status: Abnormal     Collection Time      11/21/13 12:39 PM       Result  Value  Ref Range     Glucose-Capillary  128 (*)  70 - 99 mg/dL   GLUCOSE, CAPILLARY     Status: Abnormal     Collection Time      11/21/13  4:16 PM       Result  Value  Ref Range     Glucose-Capillary  100 (*)  70 - 99 mg/dL   GLUCOSE, CAPILLARY     Status: Abnormal     Collection Time      11/21/13  9:16 PM       Result  Value  Ref Range     Glucose-Capillary  112 (*)  70 - 99 mg/dL   BASIC METABOLIC PANEL     Status: Abnormal     Collection Time      11/22/13  3:29 AM       Result  Value  Ref Range     Sodium  137   137 - 147 mEq/L     Potassium  4.9   3.7 - 5.3 mEq/L     Chloride  103   96 - 112 mEq/L     CO2  23   19 - 32 mEq/L     Glucose, Bld  106 (*)  70 - 99 mg/dL     BUN  17   6 - 23 mg/dL     Creatinine, Ser  1.77 (*)  0.50 - 1.10 mg/dL     Calcium  8.9   8.4 - 10.5 mg/dL     GFR calc non Af Amer  29 (*)  >90 mL/min     GFR calc Af Amer  33 (*)  >90 mL/min     Comment:  (NOTE)        The eGFR has been calculated using the CKD EPI equation.        This calculation has not been validated in all clinical situations.        eGFR's persistently <90 mL/min signify possible Chronic Kidney        Disease.     Anion gap  11   5 - 15   VITAMIN B12     Status: Abnormal  Collection Time      11/22/13  3:29 AM       Result  Value  Ref Range     Vitamin B-12  1176 (*)  211 - 911 pg/mL     Comment:  Performed at [facility]     Status: None     Collection Time      11/22/13  3:29 AM       Result  Value  Ref Range     Folate  17.3        Comment:  (NOTE)        Reference Ranges               Deficient:        0.4 - 3.3 ng/mL               Indeterminate:   3.4 - 5.4 ng/mL               Normal:              > 5.4 ng/mL        Performed at [facility]   IRON AND TIBC     Status: Abnormal     Collection Time      11/22/13  3:29 AM       Result  Value  Ref Range     Iron  30 (*)  42 - 135 ug/dL     TIBC  180 (*)  250 - 470 ug/dL     Saturation Ratios  17 (*)  20 - 55 %     UIBC  150   125 - 400 ug/dL     Comment:  Performed at [facility]     Status: None     Collection Time      11/22/13  3:29 AM       Result  Value  Ref Range     Ferritin  171   10 - 291 ng/mL     Comment:  Performed at [facility]   RETICULOCYTES     Status: Abnormal     Collection Time      11/22/13  3:29 AM       Result  Value  Ref Range     Retic Ct Pct  1.3   0.4 - 3.1 %     RBC.  3.57 (*)  3.87 - 5.11 MIL/uL     Retic Count, Manual  46.4   19.0 - 186.0 K/uL   CBC     Status: Abnormal     Collection Time      11/22/13  3:29 AM       Result  Value  Ref Range     WBC  6.9   4.0 - 10.5 K/uL     RBC  3.57 (*)  3.87 - 5.11 MIL/uL     Hemoglobin  9.1 (*)  12.0 - 15.0 g/dL     HCT  27.8 (*)  36.0 - 46.0 %     MCV  77.9 (*)  78.0 - 100.0 fL     MCH  25.5 (*)  26.0 - 34.0 pg     MCHC  32.7   30.0 - 36.0 g/dL     RDW  16.3 (*)  11.5 - 15.5 %     Platelets  453 (*)  150 - 400 K/uL   HEMOGLOBIN A1C  Status: Abnormal     Collection Time      11/22/13  3:29 AM       Result  Value  Ref Range     Hemoglobin A1C  6.7 (*)  <5.7 %     Comment:  (NOTE)                                                                                     According to the ADA Clinical Practice Recommendations for 2011, when        HbA1c is used as a screening test:         >=6.5%   Diagnostic of Diabetes Mellitus                  (if abnormal result is confirmed)        5.7-6.4%   Increased risk of developing Diabetes Mellitus        References:Diagnosis and Classification of Diabetes  Mellitus,Diabetes        XBMW,4132,44(WNUUV 1):S62-S69 and Standards of Medical Care in                Diabetes - 2011,Diabetes Care,2011,34 (Suppl 1):S11-S61.     Mean Plasma Glucose  146 (*)  <117 mg/dL     Comment:  Performed at [facility]     Status: Abnormal     Collection Time      11/22/13  3:29 AM       Result  Value  Ref Range     Cholesterol  109   0 - 200 mg/dL     Triglycerides  97   <150 mg/dL     HDL  21 (*)  >39 mg/dL     Total CHOL/HDL Ratio  5.2        VLDL  19   0 - 40 mg/dL     LDL Cholesterol  69   0 - 99 mg/dL     Comment:                Total Cholesterol/HDL:CHD Risk        Coronary Heart Disease Risk Table                            Men   Women         1/2 Average Risk   3.4   3.3         Average Risk       5.0   4.4         2 X Average Risk   9.6   7.1         3 X Average Risk  23.4   11.0                      Use the calculated Patient Ratio        above and the CHD Risk Table        to determine the patient's CHD Risk.  ATP III CLASSIFICATION (LDL):         <100     mg/dL   Optimal         100-129  mg/dL   Near or Above                           Optimal         130-159  mg/dL   Borderline         160-189  mg/dL   High         >190     mg/dL   Very High   GLUCOSE, CAPILLARY     Status: None     Collection Time      11/22/13  6:14 AM       Result  Value  Ref Range     Glucose-Capillary  98   70 - 99 mg/dL     Comment 1  Documented in Chart      GLUCOSE, CAPILLARY     Status: None     Collection Time      11/22/13 11:19 AM       Result  Value  Ref Range     Glucose-Capillary  99   70 - 99 mg/dL    No results found.         Medical Problem List and Plan: 1. Functional deficits secondary to Deconditioning due to sepsis, fluid overload, cardiac issues.   2.  DVT Prophylaxis/Anticoagulation: Pharmaceutical: Lovenox 3. Chronic Knee/back pain/Pain Management:  Did NOT use Hydrocodone at home--has  intolerance of narcotics and continues to have persistent nausea. Will change to ultram and robaxin prn. Heat to bilateral knees and/or back.   4. Mood: LCSW to follow for evaluation and support.   5. Neuropsych: This patient is capable of making decisions on her  own behalf. 6. Skin/Wound Care:  Pressure relief measures for breakdown prevent.   7. Lymphedema BLE: Continue to monitor. Has been treated with unna boots at wound center in the past. Add lac hydrin for ichthyosis BLE.   8. AKI: Lasix on hold. Continue ace for now. Cr-1.0 at admission. Check serial BMET 9. H/o Palpations: Heart block resolved with decreased dose BB. Has had issues with sinus tachycardia off BB. Monitor HR every 8 hours 10. Constipation: Set bowel program. D/c narcotics.   11. Morbid obesity:  Monitor skin and encourage OOB to prevent breakdown.   12. DM type 2:  Hgb A1c 6.7--has been boderline diabetic but does not have good grasp of diet. Will consult RD to educate on diet/carb counting. Monitor BS ac/hs and use SSI for elevated BS. Initiate oral agent if indicated.   13. Sepsis due to UTI/Cellulitis: Cellulitis resolved. Edema BLE at baseline per patient. Check follow up culture post treatment.       Post Admission Physician Evaluation: Functional deficits secondary  to Deconditioning due to multiple medical issues Patient is admitted to receive collaborative, interdisciplinary care between the physiatrist, rehab nursing staff, and therapy team. Patient's level of medical complexity and substantial therapy needs in context of that medical necessity cannot be provided at a lesser intensity of care such as a SNF. Patient has experienced substantial functional loss from his/her baseline which was documented above under the "Functional History" and "Functional Status" headings.  Judging by the patient's diagnosis, physical exam, and functional history, the patient has potential for functional progress which will result in  measurable gains while on inpatient  rehab.  These gains will be of substantial and practical use upon discharge  in facilitating mobility and self-care at the household level. Physiatrist will provide 24 hour management of medical needs as well as oversight of the therapy plan/treatment and provide guidance as appropriate regarding the interaction of the two. 24 hour rehab nursing will assist with bladder management, bowel management, safety, skin/wound care, disease management, medication administration, pain management and patient education  and help integrate therapy concepts, techniques,education, etc. PT will assess and treat for/with: pre gait, gait training, endurance , safety, equipment, neuromuscular re education.   Goals are: sup/modI. OT will assess and treat for/with: ADLs, Cognitive perceptual skills, Neuromuscular re education, safety, endurance, equipment.   Goals are: Sup/modI. SLP will assess and treat for/with: NA.  Goals are: NA. Case Management and Social Worker will assess and treat for psychological issues and discharge planning. Team conference will be held weekly to assess progress toward goals and to determine barriers to discharge. Patient will receive at least 3 hours of therapy per day at least 5 days per week. ELOS: 10-14        Prognosis:  good  Charlett Blake M.D. [facility] FAAPM&R (Sports Med, Neuromuscular Med) Diplomate Am Board of Electrodiagnostic Med  11/22/2013

## 2013-11-23 NOTE — Plan of Care (Signed)
Problem: Food- and Nutrition-Related Knowledge Deficit (NB-1.1) Goal: Nutrition education Formal process to instruct or train a patient/client in a skill or to impart knowledge to help patients/clients voluntarily manage or modify food choices and eating behavior to maintain or improve health. Outcome: Completed/Met Date Met:  11/23/13  RD consulted for nutrition education regarding diabetes.     Lab Results  Component Value Date    HGBA1C 6.7* 11/22/2013    RD provided "Carbohydrate Counting for People with Diabetes" handout from the Academy of Nutrition and Dietetics. Discussed different food groups and their effects on blood sugar, emphasizing carbohydrate-containing foods. Provided list of carbohydrates and recommended serving sizes of common foods.  Discussed importance of controlled and consistent carbohydrate intake throughout the day. Discussed the importance of eating 3 to 5 servings of carbohydrates at each meal. Pt reports she regularly drinks sodas and juices throughout the day. Discussed diabetic friendly drinks. Pt is willing ti cut down on her sweetened beverage intake. Provided examples of ways to balance meals/snacks and encouraged intake every 4 to 5 hours. Pt questioned about healthy seasonings for cooking. Examples of healthy seasonings were discussed. Teach back method used.  Expect good compliance.  Body mass index is 47.07 kg/(m^2). Pt meets criteria for morbid obesity based on current BMI.  Current diet order is heart healthy/carb modified. Labs and medications reviewed. No further nutrition interventions warranted at this time. RD contact information provided. If additional nutrition issues arise, please re-consult RD.  Kallie Locks, MS, Provisional LDN Pager # [phone] After hours/ weekend pager # [phone]

## 2013-11-23 NOTE — Progress Notes (Signed)
Discussed with Dr. Conley Canal. Pt to be admitted to inpt rehab today. I will arrange. [phone]

## 2013-11-23 NOTE — Progress Notes (Signed)
Patient request to wait until tomorrow before taking stool softners. Educated patient on bowel program. Laurens Matheny, RN

## 2013-11-23 NOTE — Progress Notes (Signed)
Patient Profile:  67 year old female who has a past medical history of Edema; Palpitations; History of diastolic dysfunction; Arthritis; OA (osteoarthritis); PAC (premature atrial contraction); PVC's (premature ventricular contractions); Morbid obesity; HTN (hypertension); Tachycardia; SVT (supraventricular tachycardia); Aortic stenosis, mild (11/17/2013); and CHF (congestive heart failure) who presented to the hospital with worsening dyspnea on exertion, and fatigue.  On arrival was with transient complete heart block with junctional escape--->Was on a high dose BB as an OP--- > BB held---> bradycardia resolved and patient developed sinus tach up to the 150s--->Low dose BB restarted.   Pro BNP 1875. Cardiac enzymes negative x 3. 2D echo: normal LVF w/ EF 60-65%. Grade 2 diastolic dysfunction. Mild AS with valve area (VTI): 1.83 cm^2. Valve area (Vmax): 1.87 cm^2. No pericardial effusion.    SCr steadily increasing 1.48 ---> 1.63 --->1.77  Subjective: No complaints. Breathing much better today. No dyspnea at rest nor with ambulation around her room.   Objective: Vital signs in last 24 hours: Temp:  [98.4 F (36.9 C)-99 F (37.2 C)] 98.9 F (37.2 C) (08/20 0609) Pulse Rate:  [63-135] 63 (08/20 0609) Resp:  [18-20] 20 (08/20 0609) BP: (99-133)/(53-84) 126/70 mmHg (08/20 0609) SpO2:  [98 %-99 %] 98 % (08/20 0609) Weight:  [338 lb 14.4 oz (153.724 kg)] 338 lb 14.4 oz (153.724 kg) (08/20 0609) Last BM Date: 11/22/13  Intake/Output from previous day: 08/19 0701 - 08/20 0700 In: 695 [P.O.:695] Out: -  Intake/Output this shift:    Medications Current Facility-Administered Medications  Medication Dose Route Frequency Provider Last Rate Last Dose  . acetaminophen (TYLENOL) tablet 650 mg  650 mg Oral Q6H PRN Oswald Hillock, MD   650 mg at 11/20/13 8416   Or  . acetaminophen (TYLENOL) suppository 650 mg  650 mg Rectal Q6H PRN Oswald Hillock, MD      . aspirin EC tablet 81 mg  81 mg Oral Q  breakfast Oswald Hillock, MD   81 mg at 11/23/13 1033  . capsicum oleoresin (TRIXAICIN) 0.025 % cream   Topical TID PRN Ritta Slot, NP      . heparin injection 5,000 Units  5,000 Units Subcutaneous 3 times per day Oswald Hillock, MD   5,000 Units at 11/23/13 6063  . hydrALAZINE (APRESOLINE) injection 10 mg  10 mg Intravenous Q6H PRN Barton Dubois, MD      . HYDROcodone-acetaminophen (NORCO/VICODIN) 5-325 MG per tablet 1-2 tablet  1-2 tablet Oral Q4H PRN Samella Parr, NP   2 tablet at 11/21/13 0516  . insulin aspart (novoLOG) injection 0-15 Units  0-15 Units Subcutaneous TID WC Oswald Hillock, MD   2 Units at 11/22/13 1742  . magnesium hydroxide (MILK OF MAGNESIA) suspension 30 mL  30 mL Oral Daily PRN Dorothy Spark, MD   30 mL at 11/20/13 1450  . methocarbamol (ROBAXIN) tablet 500 mg  500 mg Oral Q6H PRN Barton Dubois, MD   500 mg at 11/22/13 0717  . metoprolol tartrate (LOPRESSOR) tablet 25 mg  25 mg Oral BID Dorothy Spark, MD   25 mg at 11/23/13 1033  . ondansetron (ZOFRAN) tablet 4 mg  4 mg Oral Q6H PRN Oswald Hillock, MD   4 mg at 11/22/13 1154   Or  . ondansetron (ZOFRAN) injection 4 mg  4 mg Intravenous Q6H PRN Oswald Hillock, MD      . pantoprazole (PROTONIX) EC tablet 40 mg  40 mg Oral Daily Barton Dubois, MD   [phone]  mg at 11/23/13 1038  . potassium chloride SA (K-DUR,KLOR-CON) CR tablet 40 mEq  40 mEq Oral Daily Barton Dubois, MD   40 mEq at 11/23/13 1033  . senna-docusate (Senokot-S) tablet 2 tablet  2 tablet Oral QHS Delfina Redwood, MD   2 tablet at 11/22/13 2151  . sodium chloride 0.9 % injection 3 mL  3 mL Intravenous Q12H Oswald Hillock, MD   3 mL at 11/22/13 2212    PE: General appearance: alert, cooperative, no distress and morbidly obese  Neck: no JVD  Lungs: clear to auscultation bilaterally  Heart: regular rate and rhythm and 2/6 SM  Extremities: edematous LEE B/L lymphadenitic changes with superimposed cellulitis   Skin: as above Neurologic: Grossly normal   Lab  Results:   Recent Labs  11/22/13 0329  WBC 6.9  HGB 9.1*  HCT 27.8*  PLT 453*   BMET  Recent Labs  11/21/13 0303 11/22/13 0329 11/23/13 0400  NA 139 137 138  K 4.8 4.9 4.8  CL 105 103 105  CO2 21 23 23   GLUCOSE 98 106* 115*  BUN 17 17 17   CREATININE 1.63* 1.77* 1.80*  CALCIUM 8.5 8.9 8.8   PT/INR No results found for this basename: LABPROT, INR,  in the last 72 hours Cholesterol  Recent Labs  11/22/13 0329  CHOL 109   Studies/Results: 2D echo 11/17/13 Study Conclusions  - Left ventricle: The cavity size was normal. There was mild concentric hypertrophy. Systolic function was normal. The estimated ejection fraction was in the range of 60% to 65%. Wall motion was normal; there were no regional wall motion abnormalities. Features are consistent with a pseudonormal left ventricular filling pattern, with concomitant abnormal relaxation and increased filling pressure (grade 2 diastolic dysfunction). - Aortic valve: There was very mild stenosis. Valve area (VTI): 1.83 cm^2. Valve area (Vmax): 1.87 cm^2. Valve area (Vmean): 2 cm^2. - Left atrium: The atrium was mildly dilated.   Assessment/Plan  Active Problems:   Venous stasis of lower extremity   HTN (hypertension)   DM type 2 (diabetes mellitus, type 2)   UTI (lower urinary tract infection)   Sepsis   Aortic stenosis   Cellulitis of leg, left   Complete heart block  1. Transient complete heart block with junctional escape - BB stopped on admission and then restarted for tachycardia at the lower dose ( Metoprolol 100---> 25 mg PO BID). Tolerating it well. NSR in the upper 60s.  Had an elevated heart rate yesterday when metoprolol was held. Her heart rate has been controlled on the 25 mg by mouth twice a day. Continue this dose.  2. SOB- Breathing better today. Echo showing normal LVF and diastolic dysfunction, mild AS. Cardiac enzymes neg x 3 She received IV lasix on admit for mildly elevated BNP with  worsening of renal function. Chest xray is clear. Now off PO Lasix for worsening Crea. Appears euvolemic. She feels that her legs are more swollen. Continue to hold lasix.   3. AKI- SCr further increased to 1.8 (up from 1.00 on arrival). Continue to hold Lasix. She is not on an ACE/ARB or thiazide diuretic. No PRN NSAIDS.  She has been on antibiotics for her UTI. She was treated with ceftriaxone which is a beta lactam antibiotic and may play a role. She is no longer on this. Continue to follow renal function closely. Will follow along in rehabilitation. She will need daily electrolytes checked. When her creatinine is trending down, we'll add back furosemide.  4. UTI with sepsis- management per IM.  5. B/L lymphadenitic changes with superimposed cellulitis - management per IM  6. HTN - controlled. Continue low dose BB.   7. Constipation - management per IM.     Hedda Crumbley S.

## 2013-11-24 ENCOUNTER — Inpatient Hospital Stay (HOSPITAL_COMMUNITY): Payer: Medicare Other | Admitting: *Deleted

## 2013-11-24 ENCOUNTER — Inpatient Hospital Stay (HOSPITAL_COMMUNITY): Payer: Medicare Other | Admitting: Physical Therapy

## 2013-11-24 ENCOUNTER — Inpatient Hospital Stay (HOSPITAL_COMMUNITY): Payer: Medicare Other | Admitting: Occupational Therapy

## 2013-11-24 DIAGNOSIS — R5381 Other malaise: Secondary | ICD-10-CM

## 2013-11-24 DIAGNOSIS — L03119 Cellulitis of unspecified part of limb: Secondary | ICD-10-CM

## 2013-11-24 DIAGNOSIS — R0602 Shortness of breath: Secondary | ICD-10-CM

## 2013-11-24 DIAGNOSIS — I872 Venous insufficiency (chronic) (peripheral): Secondary | ICD-10-CM

## 2013-11-24 DIAGNOSIS — L02419 Cutaneous abscess of limb, unspecified: Secondary | ICD-10-CM

## 2013-11-24 LAB — COMPREHENSIVE METABOLIC PANEL
ALT: 30 U/L (ref 0–35)
AST: 33 U/L (ref 0–37)
Albumin: 2.4 g/dL — ABNORMAL LOW (ref 3.5–5.2)
Alkaline Phosphatase: 58 U/L (ref 39–117)
Anion gap: 10 (ref 5–15)
BUN: 16 mg/dL (ref 6–23)
CO2: 24 mEq/L (ref 19–32)
Calcium: 9 mg/dL (ref 8.4–10.5)
Chloride: 105 mEq/L (ref 96–112)
Creatinine, Ser: 1.63 mg/dL — ABNORMAL HIGH (ref 0.50–1.10)
GFR calc Af Amer: 37 mL/min — ABNORMAL LOW (ref >90)
GFR calc non Af Amer: 32 mL/min — ABNORMAL LOW (ref >90)
Glucose, Bld: 98 mg/dL (ref 70–99)
Potassium: 5 mEq/L (ref 3.7–5.3)
Sodium: 139 mEq/L (ref 137–147)
Total Bilirubin: 0.3 mg/dL (ref 0.3–1.2)
Total Protein: 7.6 g/dL (ref 6.0–8.3)

## 2013-11-24 LAB — CBC WITH DIFFERENTIAL/PLATELET
Basophils Absolute: 0 10*3/uL (ref 0.0–0.1)
Basophils Relative: 1 % (ref 0–1)
Eosinophils Absolute: 0.1 10*3/uL (ref 0.0–0.7)
Eosinophils Relative: 2 % (ref 0–5)
HCT: 29.6 % — ABNORMAL LOW (ref 36.0–46.0)
Hemoglobin: 9.3 g/dL — ABNORMAL LOW (ref 12.0–15.0)
Lymphocytes Relative: 30 % (ref 12–46)
Lymphs Abs: 1.8 10*3/uL (ref 0.7–4.0)
MCH: 25.3 pg — ABNORMAL LOW (ref 26.0–34.0)
MCHC: 31.4 g/dL (ref 30.0–36.0)
MCV: 80.4 fL (ref 78.0–100.0)
Monocytes Absolute: 0.5 10*3/uL (ref 0.1–1.0)
Monocytes Relative: 8 % (ref 3–12)
Neutro Abs: 3.5 10*3/uL (ref 1.7–7.7)
Neutrophils Relative %: 59 % (ref 43–77)
Platelets: 457 10*3/uL — ABNORMAL HIGH (ref 150–400)
RBC: 3.68 MIL/uL — ABNORMAL LOW (ref 3.87–5.11)
RDW: 16.6 % — ABNORMAL HIGH (ref 11.5–15.5)
WBC: 5.9 10*3/uL (ref 4.0–10.5)

## 2013-11-24 LAB — URINE CULTURE
Colony Count: NO GROWTH
Culture: NO GROWTH

## 2013-11-24 LAB — GLUCOSE, CAPILLARY
Glucose-Capillary: 132 mg/dL — ABNORMAL HIGH (ref 70–99)
Glucose-Capillary: 95 mg/dL (ref 70–99)
Glucose-Capillary: 94 mg/dL (ref 70–99)
Glucose-Capillary: 98 mg/dL (ref 70–99)
Glucose-Capillary: 101 mg/dL — ABNORMAL HIGH (ref 70–99)
Glucose-Capillary: 117 mg/dL — ABNORMAL HIGH (ref 70–99)

## 2013-11-24 MED ORDER — FUROSEMIDE 40 MG PO TABS
40.0000 mg | ORAL_TABLET | Freq: Every day | ORAL | Status: DC
  Administered 2013-11-24 – 2013-11-27 (×4): 40 mg via ORAL
  Filled 2013-11-24 (×7): qty 1

## 2013-11-24 NOTE — Progress Notes (Signed)
Patient Profile:  68 year old female who has a past medical history of Edema; Palpitations; History of diastolic dysfunction; Arthritis; OA (osteoarthritis); PAC (premature atrial contraction); PVC's (premature ventricular contractions); Morbid obesity; HTN (hypertension); Tachycardia; SVT (supraventricular tachycardia); Aortic stenosis, mild (11/17/2013); and CHF (congestive heart failure) who presented to the hospital with worsening dyspnea on exertion, and fatigue.  On arrival was with transient complete heart block with junctional escape--->Was on a high dose BB as an OP--- > BB held---> bradycardia resolved and patient developed sinus tach up to the 150s--->Low dose BB restarted.  Pro BNP 1875. Cardiac enzymes negative x 3. 2D echo: normal LVF w/ EF 60-65%. Grade 2 diastolic dysfunction. Mild AS with valve area (VTI): 1.83 cm^2. Valve area (Vmax): 1.87 cm^2. No pericardial effusion.  SCr steadily increasing 1.48 ---> 1.63 --->1.77   Subjective: No complaints  Objective: Vital signs in last 24 hours: Temp:  [98.3 F (36.8 C)] 98.3 F (36.8 C) (08/21 0500) Pulse Rate:  [59-95] 95 (08/21 0815) Resp:  [18] 18 (08/21 0500) BP: (106-137)/(56-65) 137/61 mmHg (08/21 0500) SpO2:  [96 %-100 %] 96 % (08/21 0815) Weight:  [337 lb 4.9 oz (153 kg)-340 lb 9.6 oz (154.495 kg)] 340 lb 9.6 oz (154.495 kg) (08/21 0500) Weight change:  Last BM Date: 11/23/13 Intake/Output from previous day:  Wt 340 up from 337 +987  08/20 0701 - 08/21 0700 In: 627 [P.O.:527] Out: -  Intake/Output this shift: Total I/O In: 360 [P.O.:360] Out: -   VQ:MGQQPYP:PJKDTOIZ affect, NAD Skin:Warm and dry, brisk capillary refill HEENT:normocephalic, sclera clear, mucus membranes moist Neck:supple, no JVD  Heart:S1S2 RRR without murmur, gallup, rub or click Lungs:clear without rales, rhonchi, or wheezes TIW:PYKD, non tender, + BS, do not palpate liver spleen or masses XIP:JASN large swollen tight lower ext, 2+  radial pulses Neuro:alert and oriented, MAE, follows commands, + facial symmetry      Lab Results:  Recent Labs  11/22/13 0329 11/24/13 0500  WBC 6.9 5.9  HGB 9.1* 9.3*  HCT 27.8* 29.6*  PLT 453* 457*   BMET  Recent Labs  11/23/13 0400 11/24/13 0500  NA 138 139  K 4.8 5.0  CL 105 105  CO2 23 24  GLUCOSE 115* 98  BUN 17 16  CREATININE 1.80* 1.63*  CALCIUM 8.8 9.0   No results found for this basename: TROPONINI, CK, MB,  in the last 72 hours  Lab Results  Component Value Date   CHOL 109 11/22/2013   HDL 21* 11/22/2013   LDLCALC 69 11/22/2013   TRIG 97 11/22/2013   CHOLHDL 5.2 11/22/2013   Lab Results  Component Value Date   HGBA1C 6.7* 11/22/2013     Lab Results  Component Value Date   TSH 3.93 07/28/2011    Hepatic Function Panel  Recent Labs  11/24/13 0500  PROT 7.6  ALBUMIN 2.4*  AST 33  ALT 30  ALKPHOS 58  BILITOT 0.3    Recent Labs  11/22/13 0329  CHOL 109   No results found for this basename: PROTIME,  in the last 72 hours     Studies/Results: No results found.  Medications: I have reviewed the patient's current medications. Scheduled Meds: . ammonium lactate   Topical BID  . aspirin EC  81 mg Oral Q breakfast  . enoxaparin (LOVENOX) injection  40 mg Subcutaneous Q24H  . hydrocerin   Topical BID  . insulin aspart  0-15 Units Subcutaneous TID WC  . metoprolol tartrate  25  mg Oral BID  . MUSCLE RUB   Topical TID AC  . pantoprazole  40 mg Oral Daily  . polyethylene glycol  17 g Oral BID  . senna-docusate  2 tablet Oral QHS   Continuous Infusions:  PRN Meds:.acetaminophen, alum & mag hydroxide-simeth, bisacodyl, diphenhydrAMINE, guaiFENesin-dextromethorphan, methocarbamol, ondansetron (ZOFRAN) IV, ondansetron, prochlorperazine, prochlorperazine, prochlorperazine, traMADol, traZODone  Assessment/Plan: 1. Transient complete heart block with junctional escape - BB stopped on admission and then restarted for tachycardia at the lower  dose ( Metoprolol 100---> 25 mg PO BID). Tolerating it well. NSR in the upper 60s. Had an elevated heart rate yesterday when metoprolol was held. Her heart rate has been controlled on the 25 mg by mouth twice a day. Continue this dose.  2. SOB- Breathing better today. Echo showing normal LVF and diastolic dysfunction, mild AS. Cardiac enzymes neg x 3 She received IV lasix on admit for mildly elevated BNP with worsening of renal function. Chest xray is clear. Now off PO Lasix for worsening Crea. Appears euvolemic. She feels that her legs are more swollen. Continue to hold lasix.  3. AKI- SCr further increased to 1.8 (up from 1.00 on arrival) now 1.63. Resume Lasix at 40 mg daily. She is not on an ACE/ARB or thiazide diuretic. No PRN NSAIDS. She has been on antibiotics for her UTI. She was treated with ceftriaxone which is a beta lactam antibiotic and may play a role. She is no longer on this. Continue to follow renal function closely. Will follow along in rehabilitation. She will need daily electrolytes checked.  4. UTI with sepsis- management per IM.  5. B/L lymphadenitic changes with superimposed cellulitis - management per IM  6. HTN - controlled. Continue low dose BB.  7. Constipation - management per IM.  8. On rehab for deconditioning due to sepsis    LOS: 1 day   Time spent with pt. :15 minutes. [facility] R  Nurse Practitioner Certified Pager [phone] or after 5pm and on weekends call [phone] 11/24/2013, 12:14 PM   I have examined the patient and reviewed assessment and plan and discussed with patient.  Agree with above as stated.  Cr better.  Restart Lasix 40 mg daily for LE edema.  WIll follow BMet in AM.  Annalyssa Thune S.

## 2013-11-24 NOTE — Care Management Note (Signed)
[facility]patient [facility] Individual Statement of Services  Patient Name:  [name] [name]  Date:  11/24/2013  Welcome to the [facility].  Our goal is to provide you with an individualized program based on your diagnosis and situation, designed to meet your specific needs.  With this comprehensive rehabilitation program, you will be expected to participate in at least 3 hours of rehabilitation therapies Monday-Friday, with modified therapy programming on the weekends.  Your rehabilitation program will include the following services:  Physical Therapy (PT), Occupational Therapy (OT), 24 hour per day rehabilitation nursing, Case Management (Social Worker), Rehabilitation Medicine, Nutrition Services and Pharmacy Services  Weekly team conferences will be held on Wednesday to discuss your progress.  Your Social Worker will talk with you frequently to get your input and to update you on team discussions.  Team conferences with you and your family in attendance may also be held.  Expected length of stay: 5-7 days  Overall anticipated outcome: Mod/i-supervision with stairs and car  Depending on your progress and recovery, your program may change. Your Social Worker will coordinate services and will keep you informed of any changes. Your Social Worker's name and contact numbers are listed  below.  The following services may also be recommended but are not provided by the [facility]:   [place] will be made to provide these services after discharge if needed.  Arrangements include referral to agencies that provide these services.  Your insurance has been verified to be:  [employer] Your primary doctor is:  Webb Silversmith  Pertinent information will be shared with your doctor and your insurance company.  Social Worker:  Ovidio Kin, [place] or ([phone]  Information discussed with and copy given to patient by: Elease Hashimoto, 11/24/2013, 11:12 AM

## 2013-11-24 NOTE — Progress Notes (Signed)
68 y.o. female with history of CHF, morbid obesity, SVT, PAC, PVS's, mild AS who was admitted on 11/17/13 with SOB, weeping edema BLE, fatigue as well as nausea and vomiting. She was treated for sepsis as work up with leucocytosis due to E coli UTI as well as BLE cellulitis. 2D echo with EF 60-655 with grade 2 diastolic dysfunction. Pro BNP-1875 and CXR without edema. She was treated with IV diuretics but has had worsening of renal status. She developed 2nd degree heart block and metoprolol d/c. Dr. Radford Pax was consulted for input and recommended restarting Lorsartan as well as cardiac monitoring till BB washed out  Subjective/Complaints: Slept poorly, freq urination but no burning  Objective: Vital Signs: Blood pressure 137/61, pulse 63, temperature 98.3 F (36.8 C), temperature source Oral, resp. rate 18, height '5\' 11"'  (1.803 m), weight 154.495 kg (340 lb 9.6 oz), SpO2 96.00%. No results found. Results for orders placed during the hospital encounter of 11/23/13 (from the past 72 hour(s))  URINALYSIS, ROUTINE W REFLEX MICROSCOPIC     Status: Abnormal   Collection Time    11/23/13  4:27 PM      Result Value Ref Range   Color, Urine YELLOW  YELLOW   APPearance CLOUDY (*) CLEAR   Specific Gravity, Urine 1.013  1.005 - 1.030   pH 6.0  5.0 - 8.0   Glucose, UA NEGATIVE  NEGATIVE mg/dL   Hgb urine dipstick NEGATIVE  NEGATIVE   Bilirubin Urine NEGATIVE  NEGATIVE   Ketones, ur NEGATIVE  NEGATIVE mg/dL   Protein, ur NEGATIVE  NEGATIVE mg/dL   Urobilinogen, UA 0.2  0.0 - 1.0 mg/dL   Nitrite NEGATIVE  NEGATIVE   Leukocytes, UA MODERATE (*) NEGATIVE  URINE MICROSCOPIC-ADD ON     Status: Abnormal   Collection Time    11/23/13  4:27 PM      Result Value Ref Range   Squamous Epithelial / LPF FEW (*) RARE   WBC, UA 7-10  <3 WBC/hpf   RBC / HPF 0-2  <3 RBC/hpf   Bacteria, UA RARE  RARE  GLUCOSE, CAPILLARY     Status: Abnormal   Collection Time    11/23/13  4:48 PM      Result Value Ref Range   Glucose-Capillary 144 (*) 70 - 99 mg/dL   Comment 1 Notify RN    GLUCOSE, CAPILLARY     Status: Abnormal   Collection Time    11/23/13  8:48 PM      Result Value Ref Range   Glucose-Capillary 132 (*) 70 - 99 mg/dL  CBC WITH DIFFERENTIAL     Status: Abnormal   Collection Time    11/24/13  5:00 AM      Result Value Ref Range   WBC 5.9  4.0 - 10.5 K/uL   RBC 3.68 (*) 3.87 - 5.11 MIL/uL   Hemoglobin 9.3 (*) 12.0 - 15.0 g/dL   HCT 29.6 (*) 36.0 - 46.0 %   MCV 80.4  78.0 - 100.0 fL   MCH 25.3 (*) 26.0 - 34.0 pg   MCHC 31.4  30.0 - 36.0 g/dL   RDW 16.6 (*) 11.5 - 15.5 %   Platelets 457 (*) 150 - 400 K/uL   Neutrophils Relative % 59  43 - 77 %   Neutro Abs 3.5  1.7 - 7.7 K/uL   Lymphocytes Relative 30  12 - 46 %   Lymphs Abs 1.8  0.7 - 4.0 K/uL   Monocytes Relative 8  3 -  12 %   Monocytes Absolute 0.5  0.1 - 1.0 K/uL   Eosinophils Relative 2  0 - 5 %   Eosinophils Absolute 0.1  0.0 - 0.7 K/uL   Basophils Relative 1  0 - 1 %   Basophils Absolute 0.0  0.0 - 0.1 K/uL  COMPREHENSIVE METABOLIC PANEL     Status: Abnormal   Collection Time    11/24/13  5:00 AM      Result Value Ref Range   Sodium 139  137 - 147 mEq/L   Potassium 5.0  3.7 - 5.3 mEq/L   Chloride 105  96 - 112 mEq/L   CO2 24  19 - 32 mEq/L   Glucose, Bld 98  70 - 99 mg/dL   BUN 16  6 - 23 mg/dL   Creatinine, Ser 1.63 (*) 0.50 - 1.10 mg/dL   Calcium 9.0  8.4 - 10.5 mg/dL   Total Protein 7.6  6.0 - 8.3 g/dL   Albumin 2.4 (*) 3.5 - 5.2 g/dL   AST 33  0 - 37 U/L   ALT 30  0 - 35 U/L   Alkaline Phosphatase 58  39 - 117 U/L   Total Bilirubin 0.3  0.3 - 1.2 mg/dL   GFR calc non Af Amer 32 (*) >90 mL/min   GFR calc Af Amer 37 (*) >90 mL/min   Comment: (NOTE)     The eGFR has been calculated using the CKD EPI equation.     This calculation has not been validated in all clinical situations.     eGFR's persistently <90 mL/min signify possible Chronic Kidney     Disease.   Anion gap 10  5 - 15  GLUCOSE, CAPILLARY      Status: None   Collection Time    11/24/13  6:43 AM      Result Value Ref Range   Glucose-Capillary 95  70 - 99 mg/dL      Nursing note and vitals reviewed.  Constitutional: She is oriented to person, place, and time. She appears well-developed and well-nourished.  Morbidly obese  HENT:  Head: Normocephalic and atraumatic.  Eyes: Conjunctivae are normal. Pupils are equal, round, and reactive to light.  Neck: Normal range of motion. Neck supple.  Cardiovascular: Normal rate and regular rhythm.  Murmur heard.  Respiratory: Effort normal and breath sounds normal. No respiratory distress. She has no wheezes.  GI: Soft. Bowel sounds are normal. She exhibits no distension. There is no tenderness.  Musculoskeletal: Tenderness right 11th and 12th ribs posterior lateral  Lymphedema BLE --L>R with resolving erythema left shin. Ichthyosis bilateral shins. LUE with limitations at shoulder due to RTC pathology. Positive drop arm test  Neurological: She is alert and oriented to person, place, and time.  UES: 5/5 prox to distal. LES: 3+ HF, 4 KE and 4/5 ankles bilaterally. No gross sensory deficits to LT.  Skin: Skin is warm and dry.  Psychiatric: Her behavior is normal. Judgment and thought content normal. Her affect is blunt   Assessment/Plan: 1. Functional deficits secondary to deconditioning following urosepsis which require 3+ hours per day of interdisciplinary therapy in a comprehensive inpatient rehab setting. Physiatrist is providing close team supervision and 24 hour management of active medical problems listed below. Physiatrist and rehab team continue to assess barriers to discharge/monitor patient progress toward functional and medical goals. FIM:                   Comprehension Comprehension Mode: Auditory Comprehension:  7-Follows complex conversation/direction: With no assist  Expression Expression Mode: Verbal Expression: 7-Expresses complex ideas: With no assist      Problem Solving Problem Solving: 7-Solves complex problems: Recognizes & self-corrects  Memory Memory: 7-Complete Independence: No helper  Medical Problem List and Plan:  1. Functional deficits secondary to Deconditioning due to sepsis, fluid overload, cardiac issues.  2. DVT Prophylaxis/Anticoagulation: Pharmaceutical: Lovenox  3. Chronic Knee/back pain/Pain Management: Did NOT use Hydrocodone at home--has intolerance of narcotics and continues to have persistent nausea. Will change to ultram and robaxin prn. Heat to bilateral knees and/or back.  4. Mood: LCSW to follow for evaluation and support.  5. Neuropsych: This patient is capable of making decisions on her own behalf.  6. Skin/Wound Care: Pressure relief measures for breakdown prevent.  7. Lymphedema BLE: Continue to monitor. Has been treated with unna boots at wound center in the past. Add lac hydrin for ichthyosis BLE.  8. AKI: Lasix on hold. Will hold ace for now. Cr-1.0 at admission. Check serial BMET  9. H/o Palpations: Heart block resolved with decreased dose BB. Has had issues with sinus tachycardia off BB. Monitor HR every 8 hours  10. Constipation: Set bowel program. D/c narcotics.  11. Morbid obesity: Monitor skin and encourage OOB to prevent breakdown.  12. DM type 2: Hgb A1c 6.7--has been boderline diabetic but does not have good grasp of diet. Will consult RD to educate on diet/carb counting. Monitor BS ac/hs and use SSI for elevated BS. Initiate oral agent if indicated.  13. Sepsis due to UTI/Cellulitis: Cellulitis resolved. Edema BLE at baseline per patient. Check follow up culture post treatment. Has urinary freq but no other symptoms currently   LOS (Days) 1 A FACE TO FACE EVALUATION WAS PERFORMED  KIRSTEINS,ANDREW E 11/24/2013, 7:31 AM

## 2013-11-24 NOTE — Progress Notes (Signed)
Social Work Assessment and Plan Social Work Assessment and Plan  Patient Details  Name: [name] [name] MRN: [id number] Date of Birth: 05/18/1945  Today's Date: 11/24/2013  Problem List:  Patient Active Problem List   Diagnosis Date Noted  . Physical deconditioning 11/23/2013  . Acute kidney injury   . Complete heart block 11/19/2013  . Febrile illness, acute 11/17/2013  . General weakness 11/17/2013  . Unable to walk 11/17/2013  . Dizziness 11/17/2013  . Nausea with vomiting 11/17/2013  . SOBOE (shortness of breath on exertion) 11/17/2013  . Multilevel spine pain 11/17/2013  . Abdominal pain, unspecified site 11/17/2013  . History of recent fall 11/17/2013  . Sputum production 11/17/2013  . Aortic stenosis, mild 11/17/2013  . UTI (lower urinary tract infection) 11/17/2013  . Sepsis 11/17/2013  . Aortic stenosis 11/17/2013  . Cellulitis of leg, left 11/17/2013  . DM type 2 (diabetes mellitus, type 2) 02/02/2013  . Hypokalemia 10/19/2012  . Arthritis 10/19/2012  . Mixed incontinence 10/19/2012  . SVT (supraventricular tachycardia) 07/28/2011  . GERD (gastroesophageal reflux disease) 04/20/2011  . Venous stasis of lower extremity 01/20/2011  . HTN (hypertension) 01/20/2011  . Morbid obesity    Past Medical History:  Past Medical History  Diagnosis Date  . Edema   . Palpitations   . History of diastolic dysfunction     Echo 05/9935 with diastolic dysfunction  . Arthritis   . OA (osteoarthritis)   . PAC (premature atrial contraction)     per prior Holter  . PVC's (premature ventricular contractions)     per prior Holter  . Morbid obesity   . HTN (hypertension)   . Tachycardia     noted at 07/28/11 visit. Started on beta blocker. Possible atrial flutter vs long PR tachycardia/AVNRT  . SVT (supraventricular tachycardia)   . Aortic stenosis, mild 11/17/2013  . CHF (congestive heart failure)   . Acute kidney injury    Past Surgical History:  Past Surgical History   Procedure Laterality Date  . Cesarean section    . US echocardiography  07/25/2008    EF 55-60%   Social History:  reports that she quit smoking about 28 years ago. Her smoking use included Cigarettes. She smoked 0.00 packs per day. She has never used smokeless tobacco. She reports that she does not drink alcohol or use illicit drugs.  Family / Support Systems Marital Status: Single Patient Roles: Parent Children: [name] Alphonse-daughter  [phone] Other Supports: Engineer, site Anticipated Caregiver: Patient and her daughter Ability/Limitations of Caregiver: Daughter works M-F during the day Caregiver Availability: Evenings only Family Dynamics: Close knit with her two daughters' her oldest daughter has health issues of her own.  She was staying with her youngest daughter until she was doing better, she still plans to return home when independent again.  Social History Preferred language: English Religion: Baptist Cultural Background: No issues Education: [employer] Read: Yes Write: Yes Employment Status: Retired Date Retired/Disabled/Unemployed: 4 years ago Freight forwarder Issues: No issues Guardian/Conservator: None-according to MD pt is capable of making her own decisions while here.   Abuse/Neglect Physical Abuse: Denies Verbal Abuse: Denies Sexual Abuse: Denies Exploitation of patient/patient's resources: Denies Self-Neglect: Denies  Emotional Status Pt's affect, behavior adn adjustment status: Pt is motivated to improve, she reports it all happened at once.  She has always been able to take care of herself and plans to regain this hopefully when leaves here.  She is trying to go with the flow and  take it one day at a time. Recent Psychosocial Issues: Multiple medical issues Pyschiatric History: No history deferred depression screen deferred due to pt felt it was not necessary at this time.  Will monitor while here and intervene  if needed. Substance Abuse History: No issues  Patient / Family Perceptions, Expectations & Goals Pt/Family understanding of illness & functional limitations: Pt is able to explain her health sisues and has been discussing with MD her treatment plan.  She feels her questions are being answered and concerns addressed.  Her main goal here is to get back on her feet and regain her strength. Premorbid pt/family roles/activities: Mother, retiree, [employer], etc Anticipated changes in roles/activities/participation: resume Pt/family expectations/goals: Pt states; " I want to regain my independence and be able to take care of myself by the time I leave here."  [employer]: None Premorbid Home Care/DME Agencies: Other (Comment) (Had in past) Transportation available at discharge: Daughter and friends  Discharge Planning Living Arrangements: [place]: Children;Other relatives;Friends/neighbors;Church/faith community Type of Residence: Private residence Insurance Resources: Multimedia programmer (specify) Forensic psychologist Medicare) Financial Resources: Radio broadcast assistant Screen Referred: No Living Expenses: Rent Money Management: Patient Does the patient have any problems obtaining your medications?: No Home Management: Patient and daughter helps her Patient/Family Preliminary Plans: Plans to go to daughter';s home until she is able to return to her apartment.  She feels she needs to get stronger and be able to be fully independent before going back to her home.  Her daughter is there in the evenings and on the weekends.  She should do well here and be short length of stay. Social Work Anticipated Follow Up Needs: HH/OP  Clinical Impression Motivated female who is willing to work to regain her independence, she needs to be mod/i before going home since she has assist in the evenings only.  Will await team's evaluations and work on a safe discharge  plan.  Elease Hashimoto 11/24/2013, 11:27 AM

## 2013-11-24 NOTE — Evaluation (Signed)
Occupational Therapy Assessment and Plan  Patient Details  Name: [name] [name] MRN: [id number] Date of Birth: 12/28/1945  OT Diagnosis: abnormal posture, muscular wasting and disuse atrophy, muscle weakness (generalized) and pain in joint Rehab Potential: Rehab Potential: Excellent ELOS: 5-7 days   Today's Date: 11/24/2013 OT Individual Time: 0800-0900 OT Individual Time Calculation (min): 60 min    Problem List:  Patient Active Problem List   Diagnosis Date Noted  . Physical deconditioning 11/23/2013  . Acute kidney injury   . Complete heart block 11/19/2013  . Febrile illness, acute 11/17/2013  . General weakness 11/17/2013  . Unable to walk 11/17/2013  . Dizziness 11/17/2013  . Nausea with vomiting 11/17/2013  . SOBOE (shortness of breath on exertion) 11/17/2013  . Multilevel spine pain 11/17/2013  . Abdominal pain, unspecified site 11/17/2013  . History of recent fall 11/17/2013  . Sputum production 11/17/2013  . Aortic stenosis, mild 11/17/2013  . UTI (lower urinary tract infection) 11/17/2013  . Sepsis 11/17/2013  . Aortic stenosis 11/17/2013  . Cellulitis of leg, left 11/17/2013  . DM type 2 (diabetes mellitus, type 2) 02/02/2013  . Hypokalemia 10/19/2012  . Arthritis 10/19/2012  . Mixed incontinence 10/19/2012  . SVT (supraventricular tachycardia) 07/28/2011  . GERD (gastroesophageal reflux disease) 04/20/2011  . Venous stasis of lower extremity 01/20/2011  . HTN (hypertension) 01/20/2011  . Morbid obesity     Past Medical History:  Past Medical History  Diagnosis Date  . Edema   . Palpitations   . History of diastolic dysfunction     Echo 0/3500 with diastolic dysfunction  . Arthritis   . OA (osteoarthritis)   . PAC (premature atrial contraction)     per prior Holter  . PVC's (premature ventricular contractions)     per prior Holter  . Morbid obesity   . HTN (hypertension)   . Tachycardia     noted at 07/28/11 visit. Started on beta blocker.  Possible atrial flutter vs long PR tachycardia/AVNRT  . SVT (supraventricular tachycardia)   . Aortic stenosis, mild 11/17/2013  . CHF (congestive heart failure)   . Acute kidney injury    Past Surgical History:  Past Surgical History  Procedure Laterality Date  . Cesarean section    . US echocardiography  07/25/2008    EF 55-60%    Assessment & Plan Clinical Impression: Patient is a 68 y.o. year old female with recent admission to the hospital on 11/17/13 with SOB, weeping edema BLE, fatigue as well as nausea and vomiting. She was treated for sepsis as work up with leucocytosis due to E coli UTI as well as BLE cellulitis. 2D echo with EF 60-655 with grade 2 diastolic dysfunction. Pro BNP-1875 and CXR without edema. She was treated with IV diuretics but has had worsening of renal status. She developed 2nd degree heart block and metoprolol d/c. Dr. Radford Pax was consulted for input and recommended restarting Lorsartan as well as cardiac monitoring till BB washed out.  Patient transferred to CIR on 11/23/2013 .    Patient currently requires min with basic self-care skills secondary to muscle weakness and muscle joint tightness and decreased standing balance and decreased postural control.  Prior to hospitalization, patient could complete ADLs with modified independent .  Patient will benefit from skilled intervention to decrease level of assist with basic self-care skills, increase independence with basic self-care skills and increase level of independence with iADL prior to discharge home with her daughter and grandson.  Anticipate patient will reach modified independent  level for selfcare tasks. No follow-up OT anticipated  OT - End of Session Activity Tolerance: Tolerates 30+ min activity with multiple rests Endurance Deficit: Yes Endurance Deficit Description: Pt with slightly labored breathing during selfcare tasks and toileting. OT Assessment Rehab Potential: Excellent OT Patient  demonstrates impairments in the following area(s): Balance;Endurance;Safety OT Basic ADL's Functional Problem(s): Grooming;Bathing;Dressing;Toileting OT Advanced ADL's Functional Problem(s): Laundry;Simple Meal Preparation OT Transfers Functional Problem(s): Tub/Shower;Toilet OT Plan OT Intensity: Minimum of 1-2 x/day, 45 to 90 minutes OT Frequency: 5 out of 7 days OT Duration/Estimated Length of Stay: 5-7 days OT Treatment/Interventions: Teacher, English as a foreign language;Discharge planning;Therapeutic Activities;UE/LE Coordination activities;Patient/family education;UE/LE Strength taining/ROM;Therapeutic Exercise;Self Care/advanced ADL retraining;Neuromuscular re-education;Community reintegration;Functional mobility training OT Self Feeding Anticipated Outcome(s): independent OT Basic Self-Care Anticipated Outcome(s): modified independent OT Toileting Anticipated Outcome(s): modified independent OT Bathroom Transfers Anticipated Outcome(s): modified independent OT Recommendation Patient destination: Home Follow Up Recommendations: None Equipment Recommended: 3 in 1 bedside comode;Tub/shower bench;Rolling walker with 5" wheels      OT Evaluation Precautions/Restrictions  Precautions Precautions: Fall Precaution Comments: poor safety awareness Restrictions Weight Bearing Restrictions: No  Pain Pain Assessment Pain Assessment: No/denies pain Home Living/Prior Functioning Home Living Family/patient expects to be discharged to:: Private residence Living Arrangements: Alone Available Help at Discharge: Family;Friend(s);Available PRN/intermittently Type of Home: Apartment Home Access: Stairs to enter Entrance Stairs-Number of Steps: 5 Entrance Stairs-Rails: Right;Left;Can reach both Home Layout: One level  Lives With: Daughter IADL History Homemaking Responsibilities: Yes Meal Prep Responsibility: Primary Laundry Responsibility:  Secondary Occupation: Retired Prior Function Level of Independence: Independent with transfers;Requires assistive device for independence;Independent with gait  Able to Take Stairs?: Yes Driving: No Vocation: Retired Comments: Does not drive anymore. Friends assist with grocery shopping.  Pt reports she was able to go to [employer] 2 wks PTA.   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  Pushes it too far away and attempts to pick it up instead of rolling it. Sensation Sensation Light Touch: Appears Intact Light Touch Impaired Details: Impaired RLE;Impaired LLE Stereognosis: Appears Intact Hot/Cold: Appears Intact Proprioception: Appears Intact Additional Comments: occasional pin pricks/tingling in B feet/legs, possible nerve damage with impaired sensation R lateral hip/thigh Coordination Gross Motor Movements are Fluid and Coordinated: Yes Fine Motor Movements are Fluid and Coordinated: Yes Motor  Motor Motor: Abnormal postural alignment and control Motor - Skilled Clinical Observations: Pt with flexed trunk in stnading unable to achieve upright standing. Mobility  Transfers Transfers: Sit to Stand;Stand to Sit Sit to Stand: 4: Min assist;With upper extremity assist;With armrests;From chair/3-in-1 Sit to Stand Details: Verbal cues for technique;Verbal cues for  sequencing Stand to Sit: 4: Min assist;With upper extremity assist Stand to Sit Details (indicate cue type and reason): Verbal cues for technique;Verbal cues for precautions/safety Stand to Sit Details: Uncontrolled descent with stand > sit  Trunk/Postural Assessment  Thoracic Assessment Thoracic Assessment: Exceptions to [facility] Thoracic Strength Overall Thoracic Strength Comments: Pt exhibits moderate kyphosis in the thoracic region. Lumbar Assessment Lumbar Assessment: Exceptions to [facility] Lumbar Strength Overall Lumbar Strength Comments: Pt with increased  lumbar flexion in standing Postural Control Postural Control: Deficits on evaluation Postural Limitations: Pt unable to exhibit trunk extension to neutral with static or dynamic standing.    Balance Balance Balance Assessed: Yes Dynamic Sitting Balance Dynamic Sitting - Balance Support: No upper extremity supported;During functional activity Dynamic Sitting - Level of Assistance: 5: Stand by assistance Static Standing Balance Static Standing - Balance Support: Right upper extremity supported;Left upper extremity supported Static Standing - Level of Assistance: 5: Stand by assistance Dynamic Standing Balance Dynamic Standing - Balance Support: Bilateral upper extremity supported Dynamic Standing - Level of Assistance: 4: Min assist Extremity/Trunk Assessment RUE Assessment RUE Assessment: Within Functional Limits LUE Assessment LUE Assessment: Exceptions to [facility] LUE Strength LUE Overall Strength Comments: Pt with history of left rotator cuff injury.  Shoulder flexion AROM 0-50 degrees and abduction only 0-60 degrees.  All other joints except for the shoulder AROM WFls and strength 4/5  FIM:  FIM - Grooming Grooming Steps: Wash, rinse, dry face;Wash, rinse, dry hands;Oral care, brush teeth, clean dentures;Brush, comb hair Grooming: 4: Steadying assist  or patient completes 3 of 4 or 4 of 5 steps FIM - Bathing Bathing Steps Patient  Completed: Chest;Right Arm;Left Arm;Abdomen;Front perineal area;Buttocks;Right upper leg;Left upper leg;Right lower leg (including foot);Left lower leg (including foot) Bathing: 4: Steadying assist FIM - Upper Body Dressing/Undressing Upper body dressing/undressing steps patient completed: Thread/unthread right sleeve of pullover shirt/dresss;Thread/unthread left sleeve of pullover shirt/dress;Put head through opening of pull over shirt/dress;Pull shirt over trunk Upper body dressing/undressing: 5: Supervision: Safety issues/verbal cues FIM - Lower Body Dressing/Undressing Lower body dressing/undressing steps patient completed: Thread/unthread right pants leg;Thread/unthread left pants leg;Pull pants up/down;Don/Doff right sock;Don/Doff left sock Lower body dressing/undressing: 4: Steadying Assist FIM - Toileting Toileting steps completed by patient: Adjust clothing prior to toileting;Performs perineal hygiene;Adjust clothing after toileting Toileting: 4: Steadying assist FIM - Control and instrumentation engineer Devices: Walker;Arm rests Bed/Chair Transfer: 4: Bed > Chair or W/C: Min A (steadying Pt. > 75%);4: Chair or W/C > Bed: Min A (steadying Pt. > 75%) FIM - Radio producer Devices: Nurse, learning disability Transfers: 4-To toilet/BSC: Min A (steadying Pt. > 75%);4-From toilet/BSC: Min A (steadying Pt. > 75%)   Refer to Care Plan for Long Term Goals  Recommendations for other services: None  Discharge Criteria: Patient will be discharged from OT if patient refuses treatment 3 consecutive times without medical reason, if treatment goals not met, if there is a change in medical status, if patient makes no progress towards goals or if patient is discharged from hospital.  The above assessment, treatment plan, treatment alternatives and goals were discussed and mutually agreed upon: by patient  Pt began working on balance, selfcare re-training,  toilet transfers with use of DME .  Pt needs mod instructional cueing to utilize the RW correctly  Natika Geyer OTR/L 11/24/2013, 12:44 PM

## 2013-11-24 NOTE — Evaluation (Signed)
Physical Therapy Assessment and Plan  Patient Details  Name: [name] [name] MRN: [id number] Date of Birth: 08/25/1945  PT Diagnosis: Abnormal posture, Difficulty walking, Edema, Impaired sensation, Low back pain, Muscle weakness and Osteoarthritis, Impaired functional endurance Rehab Potential: Good ELOS: 5-7 days   Today's Date: 11/24/2013 PT Individual Time: 1000-1100 PT Individual Time Calculation (min): 60 min    Problem List:  Patient Active Problem List   Diagnosis Date Noted  . Physical deconditioning 11/23/2013  . Acute kidney injury   . Complete heart block 11/19/2013  . Febrile illness, acute 11/17/2013  . General weakness 11/17/2013  . Unable to walk 11/17/2013  . Dizziness 11/17/2013  . Nausea with vomiting 11/17/2013  . SOBOE (shortness of breath on exertion) 11/17/2013  . Multilevel spine pain 11/17/2013  . Abdominal pain, unspecified site 11/17/2013  . History of recent fall 11/17/2013  . Sputum production 11/17/2013  . Aortic stenosis, mild 11/17/2013  . UTI (lower urinary tract infection) 11/17/2013  . Sepsis 11/17/2013  . Aortic stenosis 11/17/2013  . Cellulitis of leg, left 11/17/2013  . DM type 2 (diabetes mellitus, type 2) 02/02/2013  . Hypokalemia 10/19/2012  . Arthritis 10/19/2012  . Mixed incontinence 10/19/2012  . SVT (supraventricular tachycardia) 07/28/2011  . GERD (gastroesophageal reflux disease) 04/20/2011  . Venous stasis of lower extremity 01/20/2011  . HTN (hypertension) 01/20/2011  . Morbid obesity     Past Medical History:  Past Medical History  Diagnosis Date  . Edema   . Palpitations   . History of diastolic dysfunction     Echo 10/621 with diastolic dysfunction  . Arthritis   . OA (osteoarthritis)   . PAC (premature atrial contraction)     per prior Holter  . PVC's (premature ventricular contractions)     per prior Holter  . Morbid obesity   . HTN (hypertension)   . Tachycardia     noted at 07/28/11 visit. Started on  beta blocker. Possible atrial flutter vs long PR tachycardia/AVNRT  . SVT (supraventricular tachycardia)   . Aortic stenosis, mild 11/17/2013  . CHF (congestive heart failure)   . Acute kidney injury    Past Surgical History:  Past Surgical History  Procedure Laterality Date  . Cesarean section    . US echocardiography  07/25/2008    EF 55-60%    Assessment & Plan Clinical Impression: [name] [name] is a 68 y.o. female with history of CHF, morbid obesity, SVT, PAC, PVS's, mild AS who was admitted on 11/17/13 with SOB, weeping edema BLE, fatigue as well as nausea and vomiting. She was treated for sepsis as work up with leucocytosis due to E coli UTI as well as BLE cellulitis. 2D echo with EF 60-655 with grade 2 diastolic dysfunction. Pro BNP-1875 and CXR without edema. She was treated with IV diuretics but has had worsening of renal status. She developed 2nd degree heart block and metoprolol d/c. Dr. Radford Pax was consulted for input and recommended restarting Lorsartan as well as cardiac monitoring till BB washed out. Cardiac enzymes X 3 negative. Heart block has resolved with tachycardia and low dose metoprolol resumed. Serum Cr on steady rise to 1.80 therefore lasix on hold. Antibiotics discontinued yesterday. PT evaluation done yesterday and patient noted to be deconditioned and limited due to back pain as well as poor posture. Patient transferred to CIR on 11/23/2013 .   Patient currently requires min with mobility secondary to muscle weakness and decreased cardiorespiratoy endurance.  Prior to hospitalization, patient was modified independent  with  mobility and lived with Daughter in a [place] home.  Home access is 5Stairs to enter.  Patient will benefit from skilled PT intervention to maximize safe functional mobility, minimize fall risk and decrease caregiver burden for planned discharge home with intermittent assist.  Anticipate patient will benefit from follow up [facility] at discharge.  PT -  End of Session Activity Tolerance: Decreased this session;Tolerates 30+ min activity with multiple rests Endurance Deficit: Yes Endurance Deficit Description: Pt with slightly labored breathing during selfcare tasks and toileting. PT Assessment Rehab Potential: Good Barriers to Discharge: Decreased caregiver support;Inaccessible home environment PT Patient demonstrates impairments in the following area(s): Balance;Behavior;Edema;Endurance;Motor;Pain;Safety;Sensory;Skin Integrity PT Transfers Functional Problem(s): Bed Mobility;Bed to Chair;Car;Furniture PT Locomotion Functional Problem(s): Ambulation;Wheelchair Mobility;Stairs PT Plan PT Intensity: Minimum of 1-2 x/day ,45 to 90 minutes PT Frequency: 5 out of 7 days PT Duration Estimated Length of Stay: 5-7 days PT Treatment/Interventions: Ambulation/gait training;Balance/vestibular training;Community reintegration;Discharge planning;Disease management/prevention;DME/adaptive equipment instruction;Functional mobility training;Neuromuscular re-education;Pain management;Patient/family education;Psychosocial support;Skin care/wound management;Stair training;Therapeutic Activities;Therapeutic Exercise;UE/LE Strength taining/ROM;UE/LE Coordination activities;Wheelchair propulsion/positioning PT Transfers Anticipated Outcome(s): mod I PT Locomotion Anticipated Outcome(s): mod I except supervision for stairs and car transfer PT Recommendation Follow Up Recommendations: Home health PT Patient destination: Home Equipment Recommended: Rolling walker with 5" wheels Equipment Details: pt has [facility]  Skilled Therapeutic Intervention Skilled therapeutic intervention initiated after completion of evaluation. Discussed with patient falls risk, safety within room, and focus of therapy during stay. Discussed possible LOS, goals, and f/u therapy. Patient demonstrates decreased safety awareness and requires frequent cues throughout session for safety with RW,  transfers, and hand placement.    PT Evaluation Precautions/Restrictions Precautions Precautions: Fall Precaution Comments: poor safety awareness Restrictions Weight Bearing Restrictions: No General Chart Reviewed: Yes Family/Caregiver Present: No  Pain Pain Assessment Pain Assessment: No/denies pain Home Living/Prior Functioning Home Living Living Arrangements: Alone Available Help at Discharge: Family;Friend(s);Available PRN/intermittently Type of Home: Apartment Home Access: Stairs to enter Entrance Stairs-Number of Steps: 5 Entrance Stairs-Rails: Right;Left;Can reach both Home Layout: One level  Lives With: Daughter Prior Function Level of Independence: Independent with transfers;Requires assistive device for independence;Independent with gait  Able to Take Stairs?: Yes Driving: No Vocation: Retired Comments: Does not drive anymore. Friends assist with grocery shopping.  Pt reports she was able to go to [employer] 2 wks PTA.   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 Vision/Perception   No changes from baseline  Cognition Overall Cognitive Status: Within Functional Limits for tasks assessed Arousal/Alertness: Awake/alert Orientation Level: Oriented X4 Attention: Focused;Sustained;Selective Focused Attention: Appears intact Sustained Attention: Appears intact Selective Attention: Appears intact Memory: Impaired Memory Impairment: Decreased short term memory Decreased Short Term Memory: Functional basic Awareness: Appears intact Problem Solving: Appears intact Safety/Judgment: Impaired Comments: Pt needs max instructional cueing for safety with use of the RW.  Pushes it too far away and attempts to pick it up instead of rolling it. Sensation Sensation Light Touch: Impaired Detail Light Touch Impaired Details: Impaired RLE;Impaired LLE Stereognosis: Appears Intact Hot/Cold: Appears Intact Proprioception: Appears Intact Additional Comments:  occasional pin pricks/tingling in B feet/legs, possible nerve damage with impaired sensation R lateral hip/thigh Coordination Gross Motor Movements are Fluid and Coordinated: Yes Fine Motor Movements are Fluid and Coordinated: Yes Motor  Motor Motor: Within Functional Limits;Abnormal postural alignment and control Motor - Skilled Clinical Observations: forward flexed at trunk in standing  Mobility Transfers Transfers: Yes Sit to Stand: 4: Min assist;With upper extremity assist;With armrests;From chair/3-in-1 Sit to Stand Details: Verbal cues for technique;Verbal  cues for sequencing Stand to Sit: 4: Min assist;With upper extremity assist Stand to Sit Details (indicate cue type and reason): Verbal cues for technique;Verbal cues for precautions/safety Stand to Sit Details: Uncontrolled descent with stand > sit Locomotion  Ambulation Ambulation/Gait Assistance: 4: Min assist;4: Min guard Ambulation Distance (Feet): 40 Feet Gait Gait: Yes Gait Pattern: Impaired Gait Pattern: Lateral trunk lean to left;Lateral trunk lean to right;Wide base of support;Trunk flexed;Step-through pattern;Decreased step length - right;Decreased stride length Gait velocity: Decreased Stairs / Additional Locomotion Stairs: Yes Stairs Assistance: 4: Min guard;4: Min assist Stairs Assistance Details: Verbal cues for sequencing;Verbal cues for technique Stair Management Technique: Two rails;Forwards;Step to pattern Number of Stairs: 5 Height of Stairs: 6 Wheelchair Mobility Wheelchair Mobility: Yes Wheelchair Assistance: 5: Investment banker, operational Details: Verbal cues for Marketing executive: Both upper extremities Wheelchair Parts Management: Needs assistance Distance: 30  Trunk/Postural Assessment  Thoracic Assessment Thoracic Assessment: Exceptions to [facility] Thoracic Strength Overall Thoracic Strength Comments: Pt exhibits moderate kyphosis in the thoracic region. Lumbar  Assessment Lumbar Assessment: Exceptions to [facility] Lumbar Strength Overall Lumbar Strength Comments: Pt with increased lumbar flexion in standing Postural Control Postural Control: Deficits on evaluation Postural Limitations: Pt unable to exhibit trunk extension to neutral with static or dynamic standing.    Balance Balance Balance Assessed: Yes Dynamic Sitting Balance Dynamic Sitting - Balance Support: No upper extremity supported;During functional activity Dynamic Sitting - Level of Assistance: 5: Stand by assistance Static Standing Balance Static Standing - Balance Support: Right upper extremity supported;Left upper extremity supported Static Standing - Level of Assistance: 5: Stand by assistance Dynamic Standing Balance Dynamic Standing - Balance Support: Bilateral upper extremity supported Dynamic Standing - Level of Assistance: 4: Min assist Extremity Assessment  RUE Assessment RUE Assessment: Within Functional Limits LUE Assessment LUE Assessment: Exceptions to [facility] LUE Strength LUE Overall Strength Comments: Pt with history of left rotator cuff injury.  Shoulder flexion AROM 0-50 degrees and abduction only 0-60 degrees.  All other joints except for the shoulder AROM WFls and strength 4/5 RLE Assessment RLE Assessment: Within Functional Limits (grossly 4 to 4+/5 throughout) LLE Assessment LLE Assessment: Exceptions to [facility] LLE Strength LLE Overall Strength: Deficits;Due to premorbid status LLE Overall Strength Comments: grossly 3+ to 4+/5 throughout  FIM:  FIM - Control and instrumentation engineer Devices: Environmental consultant;Arm rests Bed/Chair Transfer: 4: Bed > Chair or W/C: Min A (steadying Pt. > 75%);4: Chair or W/C > Bed: Min A (steadying Pt. > 75%) FIM - Locomotion: Wheelchair Distance: 30 Locomotion: Wheelchair: 1: Travels less than 50 ft with supervision, cueing or coaxing FIM - Locomotion: Ambulation Locomotion: Ambulation Assistive Devices: Astronomer Ambulation/Gait Assistance: 4: Min assist;4: Min guard Locomotion: Ambulation: 1: Travels less than 50 ft with minimal assistance (Pt.>75%) FIM - Locomotion: Stairs Locomotion: Scientist, physiological: Hand rail - 2 Locomotion: Stairs: 2: Up and Down 4 - 11 stairs with minimal assistance (Pt.>75%)   Refer to Care Plan for Long Term Goals  Recommendations for other services: None  Discharge Criteria: Patient will be discharged from PT if patient refuses treatment 3 consecutive times without medical reason, if treatment goals not met, if there is a change in medical status, if patient makes no progress towards goals or if patient is discharged from hospital.  The above assessment, treatment plan, treatment alternatives and goals were discussed and mutually agreed upon: by patient  Laretta Alstrom 11/24/2013, 12:27 PM

## 2013-11-24 NOTE — Progress Notes (Signed)
Physical Therapy Session Note  Patient Details  Name: [name] [name] MRN: [id number] Date of Birth: 01-Feb-1946  Today's Date: 11/24/2013 PT Individual Time: 1300-1400 PT Individual Time Calculation (min): 60 min   Short Term Goals: Week 1:  PT Short Term Goal 1 (Week 1): = LTGs due to anticipated LOS  Skilled Therapeutic Interventions/Progress Updates:  Patient received sitting in recliner. Session focused on increasing activity tolerance with functional transfers, LE strengthening, and functional mobility. Sit<>supine with supervision on flat bed without rails. Wheelchair mobility with B UE/LE and supervision 100'x1 and 60' x1 and extra time. Functional ambulation 55' x1, 87' x1, 105' x1 with RW and min guard progressing to close supervision, cues for upright posture and appropriate pacing as well as cues for maintaining BOS within RW. Initiated [place] exercises: standing heel raises, hip flex, knee flex, mini squats and hip abd, seated LAQs with 3" hold x15-20 with B UE on RW and supervision-min guard. Patient left sitting in recliner with all needs within reach.   Therapy Documentation Precautions:  Precautions Precautions: Fall Precaution Comments: poor safety awareness Restrictions Weight Bearing Restrictions: No Pain: Pain Assessment Pain Assessment: No/denies pain Pain Score: 0-No pain  See FIM for current functional status  Therapy/Group: Individual Therapy  [name] [name]. [name] [name], PT, DPT 11/24/2013, 1:59 PM

## 2013-11-24 NOTE — Progress Notes (Signed)
Patient information reviewed and entered into [facility] System by Becky Shulamit Donofrio, covering PPS coordinator. Information including medical coding and functional independence measure will be reviewed and updated through discharge.  Per nursing, patient was given "Data Collection Information Summary for Patients in Inpatient Rehabilitation Facilities with attached Privacy Act Statement Health Care Records" upon admission.     

## 2013-11-24 NOTE — Plan of Care (Signed)
Note/chart reviewed.  Katie Laylani Pudwill, RD, LDN Pager #: [phone] After-Hours Pager #: [phone]  

## 2013-11-25 ENCOUNTER — Inpatient Hospital Stay (HOSPITAL_COMMUNITY): Payer: Medicare Other | Admitting: *Deleted

## 2013-11-25 LAB — BASIC METABOLIC PANEL
Anion gap: 14 (ref 5–15)
BUN: 15 mg/dL (ref 6–23)
CO2: 24 mEq/L (ref 19–32)
Calcium: 9.2 mg/dL (ref 8.4–10.5)
Chloride: 105 mEq/L (ref 96–112)
Creatinine, Ser: 1.58 mg/dL — ABNORMAL HIGH (ref 0.50–1.10)
GFR calc Af Amer: 38 mL/min — ABNORMAL LOW (ref >90)
GFR calc non Af Amer: 33 mL/min — ABNORMAL LOW (ref >90)
Glucose, Bld: 97 mg/dL (ref 70–99)
Potassium: 4.6 mEq/L (ref 3.7–5.3)
Sodium: 143 mEq/L (ref 137–147)

## 2013-11-25 LAB — GLUCOSE, CAPILLARY
Glucose-Capillary: 102 mg/dL — ABNORMAL HIGH (ref 70–99)
Glucose-Capillary: 102 mg/dL — ABNORMAL HIGH (ref 70–99)
Glucose-Capillary: 105 mg/dL — ABNORMAL HIGH (ref 70–99)
Glucose-Capillary: 110 mg/dL — ABNORMAL HIGH (ref 70–99)

## 2013-11-25 NOTE — Progress Notes (Addendum)
Occupational Therapy Session Note  Patient Details  Name: [name] [name] MRN: [id number] Date of Birth: 1946-01-31  Today's Date: 11/25/2013 OT Individual Time:  -   0930-1030   (60 min)     Short Term Goals: Week 1:  OT Short Term Goal 1 (Week 1): LTGs equal to STGs which are set at modified independent.  Skilled Therapeutic Interventions/Progress Updates:    Pt. Lying in bed.  Addressed bed mobility, sit to stand, functional mobility, standing balance and tolerance, therapeutic bathing and dressing.  OT gathered supplies.  Pt. Has no clothes.  Wore hospital gowns.  Pt ambulated with RW to regular toilet and transferred from stand to sit with SBA.  Pt had BM and urine.  Pt. Elected to complete bath sitting on toilet.  OT provided supplies.  Pt bathed in sitting and standing for peri care.  Pt. Has leaky bladder.  Instructed pt on pelvic floor exercises.  Pt reported she does but not do consistently like she knows she should.  Pt. Ambulated out to bed and completed dressing with hospital gowns.  Pt able to wash feet and don/doff footies. Left pt in bed with safety belt on and call bell,phone within reach.      Therapy Documentation Precautions:  Precautions Precautions: Fall Precaution Comments: poor safety awareness Restrictions Weight Bearing Restrictions: No     Pain:5/10- between shoulders   See FIM for current functional status  Therapy/Group: Individual Therapy  Lisa Roca 11/25/2013, 8:31 AM

## 2013-11-25 NOTE — Progress Notes (Signed)
Patient Name: [name] [name]      SUBJECTIVE:  Admitted with worsening dyspnea on exertion, and fatigue Pro BNP 1875  CEz -X 3  Also found to have UTI lower extremity cellulitis     On arrival was with transient complete heart block with junctional escape--->Was on a high dose BB as an OP--- > BB held---> bradycardia resolved and patient developed sinus tach up to the 150s--->Low dose BB restarted.    past medical history of Edema; Palpitations; History of diastolic dysfunction; Arthritis; OA (osteoarthritis); PAC (premature atrial contraction); PVC's (premature ventricular contractions); Morbid obesity; HTN (hypertension); Tachycardia; SVT (supraventricular tachycardia); Aortic stenosis, mild (11/17/2013); and CHF (congestive heart failure) who presented to the .  . 2D echo: normal LVF w/ EF 60-65%. Grade 2 diastolic dysfunction. Mild AS with valve area (VTI): 1.83 cm^2. Valve area (Vmax): 1.87 cm^2. No pericardial effusion.   Denies chest pain or shortness of breath   Past Medical History  Diagnosis Date  . Edema   . Palpitations   . History of diastolic dysfunction     Echo 04/6943 with diastolic dysfunction  . Arthritis   . OA (osteoarthritis)   . PAC (premature atrial contraction)     per prior Holter  . PVC's (premature ventricular contractions)     per prior Holter  . Morbid obesity   . HTN (hypertension)   . Tachycardia     noted at 07/28/11 visit. Started on beta blocker. Possible atrial flutter vs long PR tachycardia/AVNRT  . SVT (supraventricular tachycardia)   . Aortic stenosis, mild 11/17/2013  . CHF (congestive heart failure)   . Acute kidney injury     Scheduled Meds:  Scheduled Meds: . ammonium lactate   Topical BID  . aspirin EC  81 mg Oral Q breakfast  . enoxaparin (LOVENOX) injection  40 mg Subcutaneous Q24H  . furosemide  40 mg Oral Daily  . hydrocerin   Topical BID  . insulin aspart  0-15 Units Subcutaneous TID WC  . metoprolol tartrate   25 mg Oral BID  . MUSCLE RUB   Topical TID AC  . pantoprazole  40 mg Oral Daily  . senna-docusate  2 tablet Oral QHS   Continuous Infusions:  acetaminophen, alum & mag hydroxide-simeth, bisacodyl, diphenhydrAMINE, guaiFENesin-dextromethorphan, methocarbamol, ondansetron (ZOFRAN) IV, ondansetron, prochlorperazine, prochlorperazine, prochlorperazine, traMADol, traZODone    PHYSICAL EXAM Filed Vitals:   11/24/13 0815 11/24/13 1323 11/24/13 2245 11/25/13 0634  BP:  121/71 131/76 132/80  Pulse: 95 62 72 68  Temp:  98.5 F (36.9 C) 98.5 F (36.9 C) 98.4 F (36.9 C)  TempSrc:  Oral Oral Oral  Resp:  18 19 19   Height:      Weight:    331 lb 8 oz (150.367 kg)  SpO2: 96% 99% 100% 96%    General appearance: alert, cooperative and no distress Lungs: clear to auscultation bilaterally Heart: regular rate and rhythm, S1, S2 normal, no murmur, click, rub or gallop Abdomen: soft, non-tender; bowel sounds normal; no masses,  no organomegaly Extremities: edema massive Skin: Skin color, texture, turgor normal. No rashes or lesions     Intake/Output Summary (Last 24 hours) at 11/25/13 1023 Last data filed at 11/24/13 2230  Gross per 24 hour  Intake    440 ml  Output      1 ml  Net    439 ml    LABS: Basic Metabolic Panel:  Recent Labs Lab 11/19/13 1406  11/20/13 0250 11/21/13  0303 11/22/13 0329 11/23/13 0400 11/24/13 0500 11/25/13 0730  NA  --   --  139 139 137 138 139 143  K  --   --  4.3 4.8 4.9 4.8 5.0 4.6  CL  --   --  104 105 103 105 105 105  CO2  --   --  23 21 23 23 24 24   GLUCOSE  --   --  96 98 106* 115* 98 97  BUN  --   --  17 17 17 17 16 15   CREATININE  --   --  1.48* 1.63* 1.77* 1.80* 1.63* 1.58*  CALCIUM  --   < > 8.7 8.5 8.9 8.8 9.0 9.2  MG 2.1  --   --   --   --   --   --   --   < > = values in this interval not displayed. Cardiac Enzymes: No results found for this basename: CKTOTAL, CKMB, CKMBINDEX, TROPONINI,  in the last 72 hours CBC:  Recent  Labs Lab 11/20/13 0250 11/22/13 0329 11/24/13 0500  WBC 8.2 6.9 5.9  NEUTROABS  --   --  3.5  HGB 8.7* 9.1* 9.3*  HCT 27.5* 27.8* 29.6*  MCV 77.5* 77.9* 80.4  PLT 427* 453* 457*   PROTIME: No results found for this basename: LABPROT, INR,  in the last 72 hours Liver Function Tests:  Recent Labs  11/24/13 0500  AST 33  ALT 30  ALKPHOS 58  BILITOT 0.3  PROT 7.6  ALBUMIN 2.4*   No results found for this basename: LIPASE, AMYLASE,  in the last 72 hours BNP: BNP (last 3 results)  Recent Labs  08/02/13 1725 11/17/13 1035  PROBNP 601.2* 1875.0*     ASSESSMENT AND PLAN:  Active Problems:   Physical deconditioning  complete heart block-transient Acute kidney injury-improving  HR issues resolved  Will sign off call for further questions   Signed, Virl Axe MD  11/25/2013

## 2013-11-25 NOTE — Progress Notes (Signed)
Physical Therapy Session Note  Patient Details  Name: [name] [name] MRN: [id number] Date of Birth: Jun 08, 1945  Today's Date: 11/25/2013  Skilled Therapeutic Interventions/Progress Updates:  Session I [phone] (60 min) Patient in bed at the beginning of the session, supine to sit with Supervision, good sitting balance EOB, transfer to stand with Supervision.Patient requested to go to the bathroom , Supervision during short gait, VC for sequencing and posture, min guard during turning as patient has difficulty sequencing with RW. Min Guard with changing undergarments.  W/c propulsion to and from gym with min A and rest breaks due to fatigue.  Gait training 2 x 75 feet with supervision and VC for posture.  Stairs 1 x 5 with B rails- step to pattern.  LAQ 2 x 15 with therband resistance, marching in sitting 2 x 1 min and in standing 2 x 1 min. Exercises to strengthen trunk with weighted ball.  Patient returned to room, all needs within reach, no c/o pain. Session II 1355-1455 (60 min) Patient agrees to therapy intervention, w/c propulsion to gym and back , using b UE going to the gym and LE returning. Exercises in standing:mini squats, alternating hip abd, marching , ball bouncing with weight shifting. Sit to stand while holding a therapy ball to facilitate transfers w/o use of UE.  Activity tolerance training-bending and reaching. Gait with FWW 2x 75 feet. Stairs 1 x 5 with B rails -min A. Patient returned to the room, and was assisted to the bathroom, than back to bed. Patient present with decreased safety awarness, needs frequent cues during transfers.  Education on safet y and energy conservation techniques. Left in the room , with all needs within reach.   Therapy Documentation Precautions:  Precautions Precautions: Fall Precaution Comments: poor safety awareness Restrictions Weight Bearing Restrictions: No Vital Signs: Therapy Vitals Temp: 98.4 F (36.9 C) Temp src:  Oral Pulse Rate: 68 Resp: 19 BP: 132/80 mmHg Patient Position (if appropriate): Sitting Oxygen Therapy SpO2: 96 % O2 Device: None (Room air)  See FIM for current functional status  Therapy/Group: Individual Therapy  [name] 11/25/2013, 8:07 AM

## 2013-11-25 NOTE — Progress Notes (Signed)
68 y.o. female with history of CHF, morbid obesity, SVT, PAC, PVS's, mild AS who was admitted on 11/17/13 with SOB, weeping edema BLE, fatigue as well as nausea and vomiting. She was treated for sepsis as work up with leucocytosis due to E coli UTI as well as BLE cellulitis. 2D echo with EF 60-65 with grade 2 diastolic dysfunction. Pro BNP-1875 and CXR without edema. She was treated with IV diuretics but has had worsening of renal status. She developed 2nd degree heart block and metoprolol d/c. Dr. Radford Pax was consulted for input and recommended restarting Lorsartan as well as cardiac monitoring till BB washed out  Subjective/Complaints: No pain c/os Mood/affect appropriate  Review of Systems - Negative except fatigue  Objective: Vital Signs: Blood pressure 132/80, pulse 68, temperature 98.4 F (36.9 C), temperature source Oral, resp. rate 19, height 5' 11" (1.803 m), weight 150.367 kg (331 lb 8 oz), SpO2 96.00%. No results found. Results for orders placed during the hospital encounter of 11/23/13 (from the past 72 hour(s))  URINE CULTURE     Status: None   Collection Time    11/23/13  4:27 PM      Result Value Ref Range   Specimen Description URINE, CLEAN CATCH     Special Requests NONE     Culture  Setup Time       Value: 11/23/2013 22:29     Performed at [facility]       Value: NO GROWTH     Performed at [facility]   Culture       Value: NO GROWTH     Performed at [facility]   Report Status 11/24/2013 FINAL    URINALYSIS, ROUTINE W REFLEX MICROSCOPIC     Status: Abnormal   Collection Time    11/23/13  4:27 PM      Result Value Ref Range   Color, Urine YELLOW  YELLOW   APPearance CLOUDY (*) CLEAR   Specific Gravity, Urine 1.013  1.005 - 1.030   pH 6.0  5.0 - 8.0   Glucose, UA NEGATIVE  NEGATIVE mg/dL   Hgb urine dipstick NEGATIVE  NEGATIVE   Bilirubin Urine NEGATIVE  NEGATIVE   Ketones, ur NEGATIVE  NEGATIVE mg/dL   Protein, ur  NEGATIVE  NEGATIVE mg/dL   Urobilinogen, UA 0.2  0.0 - 1.0 mg/dL   Nitrite NEGATIVE  NEGATIVE   Leukocytes, UA MODERATE (*) NEGATIVE  URINE MICROSCOPIC-ADD ON     Status: Abnormal   Collection Time    11/23/13  4:27 PM      Result Value Ref Range   Squamous Epithelial / LPF FEW (*) RARE   WBC, UA 7-10  <3 WBC/hpf   RBC / HPF 0-2  <3 RBC/hpf   Bacteria, UA RARE  RARE  GLUCOSE, CAPILLARY     Status: Abnormal   Collection Time    11/23/13  4:48 PM      Result Value Ref Range   Glucose-Capillary 144 (*) 70 - 99 mg/dL   Comment 1 Notify RN    GLUCOSE, CAPILLARY     Status: Abnormal   Collection Time    11/23/13  8:48 PM      Result Value Ref Range   Glucose-Capillary 132 (*) 70 - 99 mg/dL  CBC WITH DIFFERENTIAL     Status: Abnormal   Collection Time    11/24/13  5:00 AM      Result Value Ref Range  WBC 5.9  4.0 - 10.5 K/uL   RBC 3.68 (*) 3.87 - 5.11 MIL/uL   Hemoglobin 9.3 (*) 12.0 - 15.0 g/dL   HCT 29.6 (*) 36.0 - 46.0 %   MCV 80.4  78.0 - 100.0 fL   MCH 25.3 (*) 26.0 - 34.0 pg   MCHC 31.4  30.0 - 36.0 g/dL   RDW 16.6 (*) 11.5 - 15.5 %   Platelets 457 (*) 150 - 400 K/uL   Neutrophils Relative % 59  43 - 77 %   Neutro Abs 3.5  1.7 - 7.7 K/uL   Lymphocytes Relative 30  12 - 46 %   Lymphs Abs 1.8  0.7 - 4.0 K/uL   Monocytes Relative 8  3 - 12 %   Monocytes Absolute 0.5  0.1 - 1.0 K/uL   Eosinophils Relative 2  0 - 5 %   Eosinophils Absolute 0.1  0.0 - 0.7 K/uL   Basophils Relative 1  0 - 1 %   Basophils Absolute 0.0  0.0 - 0.1 K/uL  COMPREHENSIVE METABOLIC PANEL     Status: Abnormal   Collection Time    11/24/13  5:00 AM      Result Value Ref Range   Sodium 139  137 - 147 mEq/L   Potassium 5.0  3.7 - 5.3 mEq/L   Chloride 105  96 - 112 mEq/L   CO2 24  19 - 32 mEq/L   Glucose, Bld 98  70 - 99 mg/dL   BUN 16  6 - 23 mg/dL   Creatinine, Ser 1.63 (*) 0.50 - 1.10 mg/dL   Calcium 9.0  8.4 - 10.5 mg/dL   Total Protein 7.6  6.0 - 8.3 g/dL   Albumin 2.4 (*) 3.5 - 5.2 g/dL    AST 33  0 - 37 U/L   ALT 30  0 - 35 U/L   Alkaline Phosphatase 58  39 - 117 U/L   Total Bilirubin 0.3  0.3 - 1.2 mg/dL   GFR calc non Af Amer 32 (*) >90 mL/min   GFR calc Af Amer 37 (*) >90 mL/min   Comment: (NOTE)     The eGFR has been calculated using the CKD EPI equation.     This calculation has not been validated in all clinical situations.     eGFR's persistently <90 mL/min signify possible Chronic Kidney     Disease.   Anion gap 10  5 - 15  GLUCOSE, CAPILLARY     Status: None   Collection Time    11/24/13  6:43 AM      Result Value Ref Range   Glucose-Capillary 95  70 - 99 mg/dL  GLUCOSE, CAPILLARY     Status: None   Collection Time    11/24/13  7:32 AM      Result Value Ref Range   Glucose-Capillary 94  70 - 99 mg/dL  GLUCOSE, CAPILLARY     Status: None   Collection Time    11/24/13 11:24 AM      Result Value Ref Range   Glucose-Capillary 98  70 - 99 mg/dL  GLUCOSE, CAPILLARY     Status: Abnormal   Collection Time    11/24/13  5:10 PM      Result Value Ref Range   Glucose-Capillary 101 (*) 70 - 99 mg/dL  GLUCOSE, CAPILLARY     Status: Abnormal   Collection Time    11/24/13  8:41 PM      Result Value Ref  Range   Glucose-Capillary 117 (*) 70 - 99 mg/dL  GLUCOSE, CAPILLARY     Status: Abnormal   Collection Time    11/25/13  6:56 AM      Result Value Ref Range   Glucose-Capillary 102 (*) 70 - 99 mg/dL  BASIC METABOLIC PANEL     Status: Abnormal   Collection Time    11/25/13  7:30 AM      Result Value Ref Range   Sodium 143  137 - 147 mEq/L   Potassium 4.6  3.7 - 5.3 mEq/L   Chloride 105  96 - 112 mEq/L   CO2 24  19 - 32 mEq/L   Glucose, Bld 97  70 - 99 mg/dL   BUN 15  6 - 23 mg/dL   Creatinine, Ser 1.58 (*) 0.50 - 1.10 mg/dL   Calcium 9.2  8.4 - 10.5 mg/dL   GFR calc non Af Amer 33 (*) >90 mL/min   GFR calc Af Amer 38 (*) >90 mL/min   Comment: (NOTE)     The eGFR has been calculated using the CKD EPI equation.     This calculation has not been  validated in all clinical situations.     eGFR's persistently <90 mL/min signify possible Chronic Kidney     Disease.   Anion gap 14  5 - 15      Nursing note and vitals reviewed.  Constitutional: She is oriented to person, place, and time. She appears well-developed and well-nourished.  Morbidly obese  HENT:  Head: Normocephalic and atraumatic.  Eyes: Conjunctivae are normal. Pupils are equal, round, and reactive to light.  Neck: Normal range of motion. Neck supple.  Cardiovascular: Normal rate and regular rhythm.  Murmur heard.  Respiratory: Effort normal and breath sounds normal. No respiratory distress. She has no wheezes.  GI: Soft. Bowel sounds are normal. She exhibits no distension. There is no tenderness.  Musculoskeletal: Tenderness right 11th and 12th ribs posterior lateral  Lymphedema BLE --L>R with resolving erythema left shin. Ichthyosis bilateral shins. LUE with limitations at shoulder due to RTC pathology. Positive drop arm test  Neurological: She is alert and oriented to person, place, and time.  UES: 5/5 prox to distal. LES: 3+ HF, 4 KE and 4/5 ankles bilaterally. No gross sensory deficits to LT.  Skin: Skin is warm and dry.  Psychiatric: Her behavior is normal. Judgment and thought content normal. Her affect is blunt   Assessment/Plan: 1. Functional deficits secondary to deconditioning following urosepsis which require 3+ hours per day of interdisciplinary therapy in a comprehensive inpatient rehab setting. Physiatrist is providing close team supervision and 24 hour management of active medical problems listed below. Physiatrist and rehab team continue to assess barriers to discharge/monitor patient progress toward functional and medical goals. FIM: FIM - Bathing Bathing Steps Patient Completed: Chest;Right Arm;Left Arm;Abdomen;Front perineal area;Buttocks;Right upper leg;Left upper leg;Right lower leg (including foot);Left lower leg (including foot) Bathing: 4:  Steadying assist  FIM - Upper Body Dressing/Undressing Upper body dressing/undressing steps patient completed: Thread/unthread right sleeve of pullover shirt/dresss;Thread/unthread left sleeve of pullover shirt/dress;Put head through opening of pull over shirt/dress;Pull shirt over trunk Upper body dressing/undressing: 5: Supervision: Safety issues/verbal cues FIM - Lower Body Dressing/Undressing Lower body dressing/undressing steps patient completed: Thread/unthread right pants leg;Thread/unthread left pants leg;Pull pants up/down;Don/Doff right sock;Don/Doff left sock Lower body dressing/undressing: 4: Steadying Assist  FIM - Toileting Toileting steps completed by patient: Adjust clothing prior to toileting;Performs perineal hygiene;Adjust clothing after toileting Toileting: 5: Supervision:  Safety issues/verbal cues  FIM - Radio producer Devices: Bedside commode;Walker Toilet Transfers: 4-To toilet/BSC: Min A (steadying Pt. > 75%);4-From toilet/BSC: Min A (steadying Pt. > 75%)  FIM - Bed/Chair Transfer Bed/Chair Transfer Assistive Devices: Copy: 6: More than reasonable amt of time  FIM - Locomotion: Wheelchair Distance: 100 Locomotion: Wheelchair: 2: Travels 87 - 149 ft with supervision, cueing or coaxing FIM - Locomotion: Ambulation Locomotion: Ambulation Assistive Devices: Administrator Ambulation/Gait Assistance: 5: Supervision Locomotion: Ambulation: 2: Travels 50 - 149 ft with minimal assistance (Pt.>75%)  Comprehension Comprehension Mode: Auditory Comprehension: 6-Follows complex conversation/direction: With extra time/assistive device  Expression Expression Mode: Verbal Expression: 7-Expresses complex ideas: With no assist  Social Interaction Social Interaction: 7-Interacts appropriately with others - No medications needed.  Problem Solving Problem Solving: 6-Solves complex problems: With extra  time  Memory Memory: 7-Complete Independence: No helper  Medical Problem List and Plan:  1. Functional deficits secondary to Deconditioning due to sepsis, fluid overload, cardiac issues.  2. DVT Prophylaxis/Anticoagulation: Pharmaceutical: Lovenox  3. Chronic Knee/back pain/Pain Management: Did NOT use Hydrocodone at home--has intolerance of narcotics and continues to have persistent nausea. Will change to ultram and robaxin prn. Heat to bilateral knees and/or back.  4. Mood: LCSW to follow for evaluation and support.  5. Neuropsych: This patient is capable of making decisions on her own behalf.  6. Skin/Wound Care: Pressure relief measures for breakdown prevent.  7. Lymphedema BLE: Continue to monitor. Has been treated with unna boots at wound center in the past. Add lac hydrin for ichthyosis BLE.  8. AKI: Lasix on hold.GFR stable in 30s  Will hold ace for now. Cr-1.0 at admission. Check serial BMET  9. H/o Palpations: Heart block resolved with decreased dose BB. Has had issues with sinus tachycardia off BB. Monitor HR every 8 hours  10. Constipation: Set bowel program. D/c narcotics.  11. Morbid obesity: Monitor skin and encourage OOB to prevent breakdown.  12. DM type 2: Hgb A1c 6.7--has been boderline diabetic but does not have good grasp of diet. Will consult RD to educate on diet/carb counting. Monitor BS ac/hs and use SSI for elevated BS. Initiate oral agent if indicated.  13. Sepsis due to UTI/Cellulitis: Cellulitis resolved. Edema BLE at baseline per patient. Check follow up culture post treatment. Has urinary freq but no other symptoms currently   LOS (Days) 2 A FACE TO FACE EVALUATION WAS PERFORMED  , E 11/25/2013, 8:56 AM

## 2013-11-26 ENCOUNTER — Inpatient Hospital Stay (HOSPITAL_COMMUNITY): Payer: Medicare Other | Admitting: Rehabilitation

## 2013-11-26 ENCOUNTER — Inpatient Hospital Stay (HOSPITAL_COMMUNITY): Payer: Medicare Other | Admitting: *Deleted

## 2013-11-26 LAB — BASIC METABOLIC PANEL
Anion gap: 13 (ref 5–15)
Anion gap: 14 (ref 5–15)
BUN: 19 mg/dL (ref 6–23)
BUN: 18 mg/dL (ref 6–23)
CO2: 24 mEq/L (ref 19–32)
CO2: 24 mEq/L (ref 19–32)
Calcium: 9.4 mg/dL (ref 8.4–10.5)
Calcium: 9.3 mg/dL (ref 8.4–10.5)
Chloride: 102 mEq/L (ref 96–112)
Chloride: 99 mEq/L (ref 96–112)
Creatinine, Ser: 1.57 mg/dL — ABNORMAL HIGH (ref 0.50–1.10)
Creatinine, Ser: 1.62 mg/dL — ABNORMAL HIGH (ref 0.50–1.10)
GFR calc Af Amer: 38 mL/min — ABNORMAL LOW (ref >90)
GFR calc Af Amer: 37 mL/min — ABNORMAL LOW (ref >90)
GFR calc non Af Amer: 33 mL/min — ABNORMAL LOW (ref >90)
GFR calc non Af Amer: 32 mL/min — ABNORMAL LOW (ref >90)
Glucose, Bld: 109 mg/dL — ABNORMAL HIGH (ref 70–99)
Glucose, Bld: 138 mg/dL — ABNORMAL HIGH (ref 70–99)
Potassium: 4.2 mEq/L (ref 3.7–5.3)
Potassium: 4 mEq/L (ref 3.7–5.3)
Sodium: 139 mEq/L (ref 137–147)
Sodium: 137 mEq/L (ref 137–147)

## 2013-11-26 LAB — GLUCOSE, CAPILLARY
Glucose-Capillary: 105 mg/dL — ABNORMAL HIGH (ref 70–99)
Glucose-Capillary: 118 mg/dL — ABNORMAL HIGH (ref 70–99)
Glucose-Capillary: 138 mg/dL — ABNORMAL HIGH (ref 70–99)
Glucose-Capillary: 115 mg/dL — ABNORMAL HIGH (ref 70–99)

## 2013-11-26 NOTE — Progress Notes (Signed)
At 1410, pt in therapy and c/o feeling her heart speed up. Checked HR via monitor and manually, in 130s. Pt also c/o feeling tired/weak and "a little bit" lightheaded. Pt wanted to try to continue with therapy, but was advised to return to room and rest. After resting for a few minutes, HR remained 130s. BP 97/95 dinamap and 92/68 manual. O2 sat 98% RA no c/o SOB. Called rehab MD and received orders for EKG and BMET, and to call cardiology. Cardiology has been paged, awaiting return call. Pt currently resting comfortably, states she still feels a bit weak but doesn't feel like her heart is pounding as hard. Hortencia Conradi RN

## 2013-11-26 NOTE — Progress Notes (Signed)
Physical Therapy Session Note  Patient Details  Name: [name] [name] MRN: [id number] Date of Birth: 18-Dec-1945  Today's Date: 11/26/2013 PT Individual Time: 1004-1104 PT Individual Time Calculation (min): 60 min   Short Term Goals: Week 1:  PT Short Term Goal 1 (Week 1): = LTGs due to anticipated LOS  Skilled Therapeutic Interventions/Progress Updates:   Pt received sitting in w/c in room, agreeable to therapy.  Pt states she would like to use restroom prior to session due to Lasix.  Ambulated to/from restroom with close S and mod cues for maintaining position inside of RW.  Pt able to manage clothing and peri care at supervision level, as well as stand at sink to wash/dry hands at S level.  Pt self proplled x 100' towards gym with BLEs/UEs in order to increase overall activity tolerance and strengthening.  Pt with intermittent rest breaks due to fatigue.  Ambulated remaining 71' to therapy gym with RW at close S level.  Once in gym, focused on sitting/standing therex.  Performed sit<>stand from varying heights in order to increase quad strength.  Pt very reliant on momentum and has NO control when sitting despite max verbal cues, she was very resistant to cuing.  Performed seated LAQ x 10 reps BLE w/ 2#ankle weight, standing hip abd x 10 reps BLE w/ 2#, standing knee flex x 10 reps BLE w/ 2#, and standing marching x 10 reps BLE w/ 2#.  Pt requires seated rest break during all exercise due to increased fatigue.  Ambulated x 60' x 2 reps to/from ADL apt w/ cues and assist as above.  Performed furniture transfer and bed mobility at S level to simulate home.  Pt obviously frustrated during session and when questioned enough by therapist, pt states he back is bothering her.  She verbalized long history of back issues and that there "is a disc missing."  Offered to let RN know and try to get pain meds, however pt declined.  Also discussed use of heat, however pt states "that doesn't work at home."  [employer] with gait and another 42' of w/c mobility.  Pt used restroom again and was left at EOB with all needs in reach with bed alarm set.    Therapy Documentation Precautions:  Precautions Precautions: Fall Precaution Comments: poor safety awareness Restrictions Weight Bearing Restrictions: No   Vital Signs: Therapy Vitals Temp: 98.4 F (36.9 C) Temp src: Oral Pulse Rate: 68 Resp: 20 BP: 122/64 mmHg Patient Position (if appropriate): Sitting Oxygen Therapy SpO2: 98 % O2 Device: None (Room air) Pain: Pt with c/o pain in shoulders and back during mobility, however did not want pain medicine and refused heat stating "that doesn't work at home."    See FIM for current functional status  Therapy/Group: Individual Therapy  Denice Bors 11/26/2013, 10:29 AM

## 2013-11-26 NOTE — Progress Notes (Signed)
Occupational Therapy Session Note  Patient Details  Name: [name] [name] MRN: [id number] Date of Birth: 30-Nov-1945  Today's Date: 11/26/2013 OT Individual Time:  - 0800-0900   (60 min)  1st session     Short Term Goals: Week 1:  OT Short Term Goal 1 (Week 1): LTGs equal to STGs which are set at modified independent.  Skilled Therapeutic Interventions/Progress Updates:     1st session:  Pt. Sitting EOB eating breakfast upon OT arrival.  Addressed bed mobility, sit to stand, functional mobility, standing balance and tolerance, therapeutic bathing and dressing. Pt. Reported her bed was wet due lasix medication and unable to get to bathroom in time.  OT gathered supplies while pt finished breakfast.  Pt.went from sit to stand from EOB with SBA and ambulated with RW to bathroom for toilet transfer with SBA.  .  She used grab bars and the regular toilet to go from standing to sitting.  Pt.had BM and urine.  Pt. Ambulated with grab bars to shower area   Educated pt on keeping walker in front and using it.   Pt bathed in sitting and standing for peri care..Pt. Washed body including feet.   Pt. Ambulated out to bed and completed dressing with hospital gowns.  Left pt in bed with bed alarm on and call bell,phone within reach.   2nd session  Time:1300-1400  (59min) Pain:  Light headedness, fatigue , but no pain Individual session Pt. Gathered clothes and rolled wc to laundry room.  Ambulated with RW to washer to load clothes with SBA and cues for using walker.  Went down to ADL apartment.  Made peanut butter crackers.  Pt. Complained of heart palpatations.  HR= 130.  Notified nurse.  Rolled pt back to room and placed pt back on side of bed.  Left with RN taking BP.  SEE nurse doc for vitals and cardiologist called to floor for  SVT .     Therapy Documentation Precautions:  Precautions Precautions: Fall Precaution Comments: poor safety awareness Restrictions Weight Bearing Restrictions: No Pain:      5/10 rhomboid area  1st session     See FIM for current functional status  Therapy/Group: Individual Therapy  Lisa Roca 11/26/2013, 7:36 AM

## 2013-11-26 NOTE — IPOC Note (Signed)
Overall Plan of Care [facility]) Patient Details Name: [name] [name] MRN: [id number] DOB: [birth year]  Admitting Diagnosis: deconditioned sepsis  Hospital Problems: Active Problems:   Physical deconditioning     Functional Problem List: Nursing Bladder  PT Balance;Behavior;Edema;Endurance;Motor;Pain;Safety;Sensory;Skin Integrity  OT Balance;Endurance;Safety  SLP    TR         Basic ADL's: OT Grooming;Bathing;Dressing;Toileting     Advanced  ADL's: OT Laundry;Simple Meal Preparation     Transfers: PT Bed Mobility;Bed to Chair;Car;Furniture  OT Tub/Shower;Toilet     Locomotion: PT Ambulation;Wheelchair Mobility;Stairs     Additional Impairments: OT    SLP        TR      Anticipated Outcomes Item Anticipated Outcome  Self Feeding independent  Swallowing      Basic self-care  modified independent  Toileting  modified independent   Bathroom Transfers modified independent  Bowel/Bladder  cont of bowel annd bladder  Transfers  mod I  Locomotion  mod I except supervision for stairs and car transfer  Communication     Cognition     Pain   (denies pain)  Safety/Judgment  adhere to safety protocol in placve   Therapy Plan: PT Intensity: Minimum of 1-2 x/day ,45 to 90 minutes PT Frequency: 5 out of 7 days PT Duration Estimated Length of Stay: 5-7 days OT Intensity: Minimum of 1-2 x/day, 45 to 90 minutes OT Frequency: 5 out of 7 days OT Duration/Estimated Length of Stay: 5-7 days         Team Interventions: Nursing Interventions Patient/Family Education;Bladder Management;Bowel Management;Disease Management/Prevention;Pain Management;Medication Management;Psychosocial Support;Discharge Planning  PT interventions Ambulation/gait training;Balance/vestibular training;Community reintegration;Discharge planning;Disease management/prevention;DME/adaptive equipment instruction;Functional mobility training;Neuromuscular re-education;Pain management;Patient/family  education;Psychosocial support;Skin care/wound management;Stair training;Therapeutic Activities;Therapeutic Exercise;UE/LE Strength taining/ROM;UE/LE Coordination activities;Wheelchair propulsion/positioning  OT Interventions Teacher, English as a foreign language;Discharge planning;Therapeutic Activities;UE/LE Coordination activities;Patient/family education;UE/LE Strength taining/ROM;Therapeutic Exercise;Self Care/advanced ADL retraining;Neuromuscular re-education;Community reintegration;Functional mobility training  SLP Interventions    TR Interventions    SW/CM Interventions Discharge Planning;Psychosocial Support;Patient/Family Education    Team Discharge Planning: Destination: PT-Home ,OT- Home , SLP-  Projected Follow-up: PT-Home health PT, OT-  None, SLP-  Projected Equipment Needs: PT-Rolling walker with 5" wheels, OT- 3 in 1 bedside comode;Tub/shower bench;Rolling walker with 5" wheels, SLP-  Equipment Details: PT-pt has SPC, OT-  Patient/family involved in discharge planning: PT- Patient,  OT-Patient, SLP-   MD ELOS: 10-14d Medical Rehab Prognosis:  Good Assessment: 68 y.o. female with history of CHF, morbid obesity, SVT, PAC, PVS's, mild AS who was admitted on 11/17/13 with SOB, weeping edema BLE, fatigue as well as nausea and vomiting. She was treated for sepsis as work up with leucocytosis due to E coli UTI as well as BLE cellulitis. 2D echo with EF 60-65 with grade 2 diastolic dysfunction. Pro BNP-1875 and CXR without edema. She was treated with IV diuretics but has had worsening of renal status. She developed 2nd degree heart block and metoprolol d/c. Dr. Radford Pax was consulted for input and recommended restarting Lorsartan as well as cardiac monitoring till BB washed out    Now requiring 24/7 Rehab RN,MD, as well as CIR level PT, OT and SLP.  Treatment team will focus on ADLs and mobility with goals set at Mod I  See Team Conference Notes for weekly updates  to the plan of care

## 2013-11-26 NOTE — Progress Notes (Signed)
68 y.o. female with history of CHF, morbid obesity, SVT, PAC, PVS's, mild AS who was admitted on 11/17/13 with SOB, weeping edema BLE, fatigue as well as nausea and vomiting. She was treated for sepsis as work up with leucocytosis due to E coli UTI as well as BLE cellulitis. 2D echo with EF 60-65 with grade 2 diastolic dysfunction. Pro BNP-1875 and CXR without edema. She was treated with IV diuretics but has had worsening of renal status. She developed 2nd degree heart block and metoprolol d/c. Dr. Radford Pax was consulted for input and recommended restarting Lorsartan as well as cardiac monitoring till BB washed out  Subjective/Complaints: Appreciate cardiology note Had BM this am Urinary freq and premorbid incontinence Mood/affect appropriate  Review of Systems - Negative except fatigue  Objective: Vital Signs: Blood pressure 122/64, pulse 68, temperature 98.4 F (36.9 C), temperature source Oral, resp. rate 20, height '5\' 11"'  (1.803 m), weight 150.367 kg (331 lb 8 oz), SpO2 98.00%. No results found. Results for orders placed during the hospital encounter of 11/23/13 (from the past 72 hour(s))  URINE CULTURE     Status: None   Collection Time    11/23/13  4:27 PM      Result Value Ref Range   Specimen Description URINE, CLEAN CATCH     Special Requests NONE     Culture  Setup Time       Value: 11/23/2013 22:29     Performed at [facility]       Value: NO GROWTH     Performed at [facility]   Culture       Value: NO GROWTH     Performed at [facility]   Report Status 11/24/2013 FINAL    URINALYSIS, ROUTINE W REFLEX MICROSCOPIC     Status: Abnormal   Collection Time    11/23/13  4:27 PM      Result Value Ref Range   Color, Urine YELLOW  YELLOW   APPearance CLOUDY (*) CLEAR   Specific Gravity, Urine 1.013  1.005 - 1.030   pH 6.0  5.0 - 8.0   Glucose, UA NEGATIVE  NEGATIVE mg/dL   Hgb urine dipstick NEGATIVE  NEGATIVE   Bilirubin Urine  NEGATIVE  NEGATIVE   Ketones, ur NEGATIVE  NEGATIVE mg/dL   Protein, ur NEGATIVE  NEGATIVE mg/dL   Urobilinogen, UA 0.2  0.0 - 1.0 mg/dL   Nitrite NEGATIVE  NEGATIVE   Leukocytes, UA MODERATE (*) NEGATIVE  URINE MICROSCOPIC-ADD ON     Status: Abnormal   Collection Time    11/23/13  4:27 PM      Result Value Ref Range   Squamous Epithelial / LPF FEW (*) RARE   WBC, UA 7-10  <3 WBC/hpf   RBC / HPF 0-2  <3 RBC/hpf   Bacteria, UA RARE  RARE  GLUCOSE, CAPILLARY     Status: Abnormal   Collection Time    11/23/13  4:48 PM      Result Value Ref Range   Glucose-Capillary 144 (*) 70 - 99 mg/dL   Comment 1 Notify RN    GLUCOSE, CAPILLARY     Status: Abnormal   Collection Time    11/23/13  8:48 PM      Result Value Ref Range   Glucose-Capillary 132 (*) 70 - 99 mg/dL  CBC WITH DIFFERENTIAL     Status: Abnormal   Collection Time    11/24/13  5:00 AM  Result Value Ref Range   WBC 5.9  4.0 - 10.5 K/uL   RBC 3.68 (*) 3.87 - 5.11 MIL/uL   Hemoglobin 9.3 (*) 12.0 - 15.0 g/dL   HCT 29.6 (*) 36.0 - 46.0 %   MCV 80.4  78.0 - 100.0 fL   MCH 25.3 (*) 26.0 - 34.0 pg   MCHC 31.4  30.0 - 36.0 g/dL   RDW 16.6 (*) 11.5 - 15.5 %   Platelets 457 (*) 150 - 400 K/uL   Neutrophils Relative % 59  43 - 77 %   Neutro Abs 3.5  1.7 - 7.7 K/uL   Lymphocytes Relative 30  12 - 46 %   Lymphs Abs 1.8  0.7 - 4.0 K/uL   Monocytes Relative 8  3 - 12 %   Monocytes Absolute 0.5  0.1 - 1.0 K/uL   Eosinophils Relative 2  0 - 5 %   Eosinophils Absolute 0.1  0.0 - 0.7 K/uL   Basophils Relative 1  0 - 1 %   Basophils Absolute 0.0  0.0 - 0.1 K/uL  COMPREHENSIVE METABOLIC PANEL     Status: Abnormal   Collection Time    11/24/13  5:00 AM      Result Value Ref Range   Sodium 139  137 - 147 mEq/L   Potassium 5.0  3.7 - 5.3 mEq/L   Chloride 105  96 - 112 mEq/L   CO2 24  19 - 32 mEq/L   Glucose, Bld 98  70 - 99 mg/dL   BUN 16  6 - 23 mg/dL   Creatinine, Ser 1.63 (*) 0.50 - 1.10 mg/dL   Calcium 9.0  8.4 - 10.5  mg/dL   Total Protein 7.6  6.0 - 8.3 g/dL   Albumin 2.4 (*) 3.5 - 5.2 g/dL   AST 33  0 - 37 U/L   ALT 30  0 - 35 U/L   Alkaline Phosphatase 58  39 - 117 U/L   Total Bilirubin 0.3  0.3 - 1.2 mg/dL   GFR calc non Af Amer 32 (*) >90 mL/min   GFR calc Af Amer 37 (*) >90 mL/min   Comment: (NOTE)     The eGFR has been calculated using the CKD EPI equation.     This calculation has not been validated in all clinical situations.     eGFR's persistently <90 mL/min signify possible Chronic Kidney     Disease.   Anion gap 10  5 - 15  GLUCOSE, CAPILLARY     Status: None   Collection Time    11/24/13  6:43 AM      Result Value Ref Range   Glucose-Capillary 95  70 - 99 mg/dL  GLUCOSE, CAPILLARY     Status: None   Collection Time    11/24/13  7:32 AM      Result Value Ref Range   Glucose-Capillary 94  70 - 99 mg/dL  GLUCOSE, CAPILLARY     Status: None   Collection Time    11/24/13 11:24 AM      Result Value Ref Range   Glucose-Capillary 98  70 - 99 mg/dL  GLUCOSE, CAPILLARY     Status: Abnormal   Collection Time    11/24/13  5:10 PM      Result Value Ref Range   Glucose-Capillary 101 (*) 70 - 99 mg/dL  GLUCOSE, CAPILLARY     Status: Abnormal   Collection Time    11/24/13  8:41 PM  Result Value Ref Range   Glucose-Capillary 117 (*) 70 - 99 mg/dL  GLUCOSE, CAPILLARY     Status: Abnormal   Collection Time    11/25/13  6:56 AM      Result Value Ref Range   Glucose-Capillary 102 (*) 70 - 99 mg/dL  BASIC METABOLIC PANEL     Status: Abnormal   Collection Time    11/25/13  7:30 AM      Result Value Ref Range   Sodium 143  137 - 147 mEq/L   Potassium 4.6  3.7 - 5.3 mEq/L   Chloride 105  96 - 112 mEq/L   CO2 24  19 - 32 mEq/L   Glucose, Bld 97  70 - 99 mg/dL   BUN 15  6 - 23 mg/dL   Creatinine, Ser 1.58 (*) 0.50 - 1.10 mg/dL   Calcium 9.2  8.4 - 10.5 mg/dL   GFR calc non Af Amer 33 (*) >90 mL/min   GFR calc Af Amer 38 (*) >90 mL/min   Comment: (NOTE)     The eGFR has been  calculated using the CKD EPI equation.     This calculation has not been validated in all clinical situations.     eGFR's persistently <90 mL/min signify possible Chronic Kidney     Disease.   Anion gap 14  5 - 15  GLUCOSE, CAPILLARY     Status: Abnormal   Collection Time    11/25/13 11:24 AM      Result Value Ref Range   Glucose-Capillary 102 (*) 70 - 99 mg/dL  GLUCOSE, CAPILLARY     Status: Abnormal   Collection Time    11/25/13  4:39 PM      Result Value Ref Range   Glucose-Capillary 105 (*) 70 - 99 mg/dL  GLUCOSE, CAPILLARY     Status: Abnormal   Collection Time    11/25/13  8:57 PM      Result Value Ref Range   Glucose-Capillary 110 (*) 70 - 99 mg/dL  BASIC METABOLIC PANEL     Status: Abnormal   Collection Time    11/26/13  6:15 AM      Result Value Ref Range   Sodium 139  137 - 147 mEq/L   Potassium 4.2  3.7 - 5.3 mEq/L   Chloride 102  96 - 112 mEq/L   CO2 24  19 - 32 mEq/L   Glucose, Bld 109 (*) 70 - 99 mg/dL   BUN 19  6 - 23 mg/dL   Creatinine, Ser 1.57 (*) 0.50 - 1.10 mg/dL   Calcium 9.4  8.4 - 10.5 mg/dL   GFR calc non Af Amer 33 (*) >90 mL/min   GFR calc Af Amer 38 (*) >90 mL/min   Comment: (NOTE)     The eGFR has been calculated using the CKD EPI equation.     This calculation has not been validated in all clinical situations.     eGFR's persistently <90 mL/min signify possible Chronic Kidney     Disease.   Anion gap 13  5 - 15  GLUCOSE, CAPILLARY     Status: Abnormal   Collection Time    11/26/13  6:31 AM      Result Value Ref Range   Glucose-Capillary 105 (*) 70 - 99 mg/dL      Nursing note and vitals reviewed.  Constitutional: She is oriented to person, place, and time. She appears well-developed and well-nourished.  Morbidly obese  HENT:  Head: Normocephalic and atraumatic.  Eyes: Conjunctivae are normal. Pupils are equal, round, and reactive to light.  Neck: Normal range of motion. Neck supple.  Cardiovascular: Normal rate and regular rhythm.   Murmur heard.  Respiratory: Effort normal and breath sounds normal. No respiratory distress. She has no wheezes.  GI: Soft. Bowel sounds are normal. She exhibits no distension. There is no tenderness.  Musculoskeletal: Tenderness right 11th and 12th ribs posterior lateral  Lymphedema BLE --L>R with resolving erythema left shin. Ichthyosis bilateral shins. LUE with limitations at shoulder due to RTC pathology. Positive drop arm test  Neurological: She is alert and oriented to person, place, and time.  UES: 5/5 prox to distal. LES: 3+ HF, 4 KE and 4/5 ankles bilaterally. No gross sensory deficits to LT.  Skin: Skin is warm and dry.  Psychiatric: Her behavior is normal. Judgment and thought content normal. Her affect is blunt   Assessment/Plan: 1. Functional deficits secondary to deconditioning following urosepsis which require 3+ hours per day of interdisciplinary therapy in a comprehensive inpatient rehab setting. Physiatrist is providing close team supervision and 24 hour management of active medical problems listed below. Physiatrist and rehab team continue to assess barriers to discharge/monitor patient progress toward functional and medical goals. FIM: FIM - Bathing Bathing Steps Patient Completed: Chest;Right Arm;Left Arm;Abdomen;Front perineal area;Buttocks;Right upper leg;Left upper leg;Right lower leg (including foot);Left lower leg (including foot) Bathing: 5: Set-up assist to: Open items  FIM - Upper Body Dressing/Undressing Upper body dressing/undressing steps patient completed: Thread/unthread right sleeve of pullover shirt/dresss;Thread/unthread left sleeve of pullover shirt/dress;Put head through opening of pull over shirt/dress;Pull shirt over trunk Upper body dressing/undressing: 5: Supervision: Safety issues/verbal cues FIM - Lower Body Dressing/Undressing Lower body dressing/undressing steps patient completed: Thread/unthread right pants leg;Thread/unthread left pants  leg;Pull pants up/down;Don/Doff right sock;Don/Doff left sock Lower body dressing/undressing: 4: Steadying Assist  FIM - Toileting Toileting steps completed by patient: Adjust clothing prior to toileting;Performs perineal hygiene;Adjust clothing after toileting Toileting: 5: Supervision: Safety issues/verbal cues  FIM - Air cabin crew Transfers Assistive Devices: Insurance account manager Transfers: 4-To toilet/BSC: Min A (steadying Pt. > 75%);4-From toilet/BSC: Min A (steadying Pt. > 75%)  FIM - Bed/Chair Transfer Bed/Chair Transfer Assistive Devices: Copy: 4: Bed > Chair or W/C: Min A (steadying Pt. > 75%);4: Chair or W/C > Bed: Min A (steadying Pt. > 75%)  FIM - Locomotion: Wheelchair Distance: 100 Locomotion: Wheelchair: 2: Travels 3 - 149 ft with supervision, cueing or coaxing FIM - Locomotion: Ambulation Locomotion: Ambulation Assistive Devices: Administrator Ambulation/Gait Assistance: 5: Supervision Locomotion: Ambulation: 2: Travels 50 - 149 ft with minimal assistance (Pt.>75%)  Comprehension Comprehension Mode: Auditory Comprehension: 6-Follows complex conversation/direction: With extra time/assistive device  Expression Expression Mode: Verbal Expression: 7-Expresses complex ideas: With no assist  Social Interaction Social Interaction: 7-Interacts appropriately with others - No medications needed.  Problem Solving Problem Solving: 6-Solves complex problems: With extra time  Memory Memory: 7-Complete Independence: No helper  Medical Problem List and Plan:  1. Functional deficits secondary to Deconditioning due to sepsis, fluid overload, cardiac issues.  2. DVT Prophylaxis/Anticoagulation: Pharmaceutical: Lovenox  3. Chronic Knee/back pain/Pain Management: Did NOT use Hydrocodone at home--has intolerance of narcotics and continues to have persistent nausea. Will change to ultram and robaxin prn. Heat to bilateral knees and/or back.  4. Mood:  LCSW to follow for evaluation and support.  5. Neuropsych: This patient is capable of making decisions on her own behalf.  6. Skin/Wound Care: Pressure relief  measures for breakdown prevent.  7. Lymphedema BLE: Continue to monitor. Has been treated with unna boots at wound center in the past. Add lac hydrin for ichthyosis BLE.  8. AKI: Lasix on hold.GFR stable in 30s  Will hold ace for now. Cr-1.0 at admission. Check serial BMET  9. H/o Palpations: Heart block resolved with decreased dose BB. Has had issues with sinus tachycardia off BB. Monitor HR every 8 hours  10. Constipation: Set bowel program. D/c narcotics.  11. Morbid obesity: Monitor skin and encourage OOB to prevent breakdown.  12. DM type 2: Hgb A1c 6.7--has been boderline diabetic but does not have good grasp of diet. Will consult RD to educate on diet/carb counting. Monitor BS ac/hs and use SSI for elevated BS. Initiate oral agent if indicated.  13. Sepsis due to UTI/Cellulitis: Cellulitis resolved. Edema BLE at baseline per patient. Check follow up culture post treatment. Has urinary freq but no other symptoms currently 13.  Urinary incont, chronic exacerbated by lasix, UA C and S neg  LOS (Days) 3 A FACE TO FACE EVALUATION WAS PERFORMED  Ronrico Dupin E 11/26/2013, 8:25 AM

## 2013-11-26 NOTE — Progress Notes (Signed)
Called by nursing after we had signed off because of tachycardia and modest hypotension. The patient has a history of recurrent tachypalpitations; his ECG has previously been identified correctly by Dr. Billee Cashing is almost certainly AV reentry. BP 92/64  Pulse 126  Temp(Src) 98.4 F (36.9 C) (Oral)  Resp 20  Ht 5\' 11"  (1.803 m)  Wt 331 lb 8 oz (150.367 kg)  BMI 46.26 kg/m2  SpO2 98%  Carotid auscultation demonstrated no bruits Lungs clear Rapid but RRR brawwny edema   Carotid massage was successful in terminating SVT in restoring sinus rhythm. Will anticipate discussions over the next couple of days regarding catheter ablation. We'll see her again in the morning.

## 2013-11-27 ENCOUNTER — Inpatient Hospital Stay (HOSPITAL_COMMUNITY): Payer: Medicare Other

## 2013-11-27 ENCOUNTER — Inpatient Hospital Stay (HOSPITAL_COMMUNITY): Payer: Medicare Other | Admitting: Physical Therapy

## 2013-11-27 ENCOUNTER — Encounter (HOSPITAL_COMMUNITY): Payer: Medicare Other | Admitting: Occupational Therapy

## 2013-11-27 DIAGNOSIS — Z5189 Encounter for other specified aftercare: Secondary | ICD-10-CM

## 2013-11-27 DIAGNOSIS — R5381 Other malaise: Secondary | ICD-10-CM

## 2013-11-27 LAB — BASIC METABOLIC PANEL WITH GFR
Anion gap: 13 (ref 5–15)
BUN: 18 mg/dL (ref 6–23)
CO2: 25 meq/L (ref 19–32)
Calcium: 9.1 mg/dL (ref 8.4–10.5)
Chloride: 103 meq/L (ref 96–112)
Creatinine, Ser: 1.47 mg/dL — ABNORMAL HIGH (ref 0.50–1.10)
GFR calc Af Amer: 41 mL/min — ABNORMAL LOW
GFR calc non Af Amer: 36 mL/min — ABNORMAL LOW
Glucose, Bld: 98 mg/dL (ref 70–99)
Potassium: 4.2 meq/L (ref 3.7–5.3)
Sodium: 141 meq/L (ref 137–147)

## 2013-11-27 LAB — GLUCOSE, CAPILLARY
Glucose-Capillary: 95 mg/dL (ref 70–99)
Glucose-Capillary: 100 mg/dL — ABNORMAL HIGH (ref 70–99)
Glucose-Capillary: 114 mg/dL — ABNORMAL HIGH (ref 70–99)
Glucose-Capillary: 112 mg/dL — ABNORMAL HIGH (ref 70–99)

## 2013-11-27 MED ORDER — FUROSEMIDE 20 MG PO TABS
20.0000 mg | ORAL_TABLET | Freq: Two times a day (BID) | ORAL | Status: DC
  Administered 2013-11-28 – 2013-11-29 (×3): 20 mg via ORAL
  Filled 2013-11-27 (×5): qty 1

## 2013-11-27 MED ORDER — ENOXAPARIN SODIUM 80 MG/0.8ML ~~LOC~~ SOLN
75.0000 mg | SUBCUTANEOUS | Status: DC
  Administered 2013-11-27 – 2013-11-28 (×2): 75 mg via SUBCUTANEOUS
  Filled 2013-11-27 (×3): qty 0.8

## 2013-11-27 MED ORDER — FUROSEMIDE 40 MG PO TABS: 40.0000 mg | ORAL_TABLET | Freq: Every day | ORAL | Status: DC

## 2013-11-27 NOTE — Progress Notes (Signed)
Patient Name: [name] [name]      SUBJECTIVE: no recurrences overnight  Without chest pain and shortness of breath  Past Medical History  Diagnosis Date  . Edema   . Palpitations   . History of diastolic dysfunction     Echo 11/3380 with diastolic dysfunction  . Arthritis   . OA (osteoarthritis)   . PAC (premature atrial contraction)     per prior Holter  . PVC's (premature ventricular contractions)     per prior Holter  . Morbid obesity   . HTN (hypertension)   . Tachycardia     noted at 07/28/11 visit. Started on beta blocker. Possible atrial flutter vs long PR tachycardia/AVNRT  . SVT (supraventricular tachycardia)   . Aortic stenosis, mild 11/17/2013  . CHF (congestive heart failure)   . Acute kidney injury     Scheduled Meds:  Scheduled Meds: . ammonium lactate   Topical BID  . aspirin EC  81 mg Oral Q breakfast  . enoxaparin (LOVENOX) injection  75 mg Subcutaneous Q24H  . [START ON 11/28/2013] furosemide  20 mg Oral BID  . hydrocerin   Topical BID  . insulin aspart  0-15 Units Subcutaneous TID WC  . metoprolol tartrate  25 mg Oral BID  . MUSCLE RUB   Topical TID AC  . pantoprazole  40 mg Oral Daily  . senna-docusate  2 tablet Oral QHS   Continuous Infusions:  acetaminophen, alum & mag hydroxide-simeth, bisacodyl, diphenhydrAMINE, guaiFENesin-dextromethorphan, methocarbamol, ondansetron (ZOFRAN) IV, ondansetron, prochlorperazine, prochlorperazine, prochlorperazine, traMADol, traZODone    PHYSICAL EXAM Filed Vitals:   11/26/13 1530 11/26/13 2011 11/27/13 0443 11/27/13 1407  BP:  112/60 111/58 113/63  Pulse: 126 77 62 60  Temp: 98.4 F (36.9 C) 98.8 F (37.1 C) 98.5 F (36.9 C) 98.4 F (36.9 C)  TempSrc: Oral Oral Oral Oral  Resp:   18 18  Height:      Weight:   327 lb 12.8 oz (148.689 kg)   SpO2:  97% 96% 100%   Well developed and nourished in no acute distress HENT normal Neck supple  Clear Regular rate and rhythm, no murmurs or  gallops Abd-soft with active BS No Clubbing cyanosis gross and brawny edema Skin-warm and dry A & Oriented  Grossly normal sensory and motor function      Intake/Output Summary (Last 24 hours) at 11/27/13 1646 Last data filed at 11/27/13 1300  Gross per 24 hour  Intake    840 ml  Output      0 ml  Net    840 ml    LABS: Basic Metabolic Panel:  Recent Labs Lab 11/22/13 0329 11/23/13 0400 11/24/13 0500 11/25/13 0730 11/26/13 0615 11/26/13 1518 11/27/13 0711  NA 137 138 139 143 139 137 141  K 4.9 4.8 5.0 4.6 4.2 4.0 4.2  CL 103 105 105 105 102 99 103  CO2 23 23 24 24 24 24 25   GLUCOSE 106* 115* 98 97 109* 138* 98  BUN 17 17 16 15 19 18 18   CREATININE 1.77* 1.80* 1.63* 1.58* 1.57* 1.62* 1.47*  CALCIUM 8.9 8.8 9.0 9.2 9.4 9.3 9.1   Cardiac Enzymes: No results found for this basename: CKTOTAL, CKMB, CKMBINDEX, TROPONINI,  in the last 72 hours CBC:  Recent Labs Lab 11/22/13 0329 11/24/13 0500  WBC 6.9 5.9  NEUTROABS  --  3.5  HGB 9.1* 9.3*  HCT 27.8* 29.6*  MCV 77.9* 80.4  PLT 453* 457*  PROTIME: No results found for this basename: LABPROT, INR,  in the last 72 hours Liver Function Tests: No results found for this basename: AST, ALT, ALKPHOS, BILITOT, PROT, ALBUMIN,  in the last 72 hours No results found for this basename: LIPASE, AMYLASE,  in the last 72 hours BNP: BNP (last 3 results)  Recent Labs  08/02/13 1725 11/17/13 1035  PROBNP 601.2* 1875.0*      ASSESSMENT AND PLAN:  Active Problems:   SVT (supraventricular tachycardia)   Physical deconditioning  PT WITH recurrent SVT and is amenable to catheter ablation for reduction of risk of recurrences  Will have Dr GT see in am  Signed, Virl Axe MD  11/27/2013

## 2013-11-27 NOTE — Progress Notes (Addendum)
Occupational Therapy Session Note  Patient Details  Name: [name] [name] MRN: [id number] Date of Birth: January 16, 1946  Today's Date: 11/27/2013 OT Individual Time: 8569-2699 OT Individual Time Calculation (min): 45 min   Short Term Goals: No short term goals set  Skilled Therapeutic Interventions/Progress Updates:      Pt seen for BADL retraining of toileting, bathing, and dressing with a focus on activity tolerance and balance. Pt was able to get out of bed, stand, ambulate to toilet with RW all with distant S. Once in the bathroom she toileted with mod I, walked 5 steps to shower, and showered with mod I.  Pt chose to wear gowns this am as her clothing was still drying. Pt donned socks, underwear and gowns with mod I.  She then stood at sink to wash out her socks. Pt stated she has been washing her clothing in the sink and hanging them to dry.  Distant S to walk to recliner. Pt did not have any SOB or limitations with her activity tolerance. Pt sitting in recliner talking on phone at end of session with all needs met.  Therapy Documentation Precautions:  Precautions Precautions: Fall Precaution Comments: poor safety awareness Restrictions Weight Bearing Restrictions: No       Pain: Pain Assessment Pain Assessment: No/denies pain (c/o minimal pain in knees with walking)  ADL:    See FIM for current functional status  Therapy/Group: Individual Therapy  [place] 11/27/2013, 10:32 AM

## 2013-11-27 NOTE — Progress Notes (Signed)
Physical Therapy Session Note  Patient Details  Name: [name] [name] MRN: [id number] Date of Birth: April 05, 1946  Today's Date: 11/27/2013 PT Individual Time: 1630-1700 PT Individual Time Calculation (min): 30 min   Short Term Goals: Week 1:  PT Short Term Goal 1 (Week 1): = LTGs due to anticipated LOS  Skilled Therapeutic Interventions/Progress Updates:  1:1. Pt received sitting in w/c in therapy gym, ready to start session. Pt expressed want to focus on B LE strength/endurance. Pt practiced multiple t/f sit<>stand w/out B UE support, reaching for horseshoes hanging from elevated rim of basketball hoop to target trunk extension and placing them just at edge of BOS to both L and R sides. Pt req close(S)-min guard A throughout demonstrating progressive increased control in B LE. Pt amb 125' back to room w/ RW and close(S), then propelling w/c w/ B UE x100'. Pt req (S) for toilet t/f, but mod(I) for toileting at end of session. Pt left sitting EOB w/ all needs in reach, bed alarm on and nurse tech in room.   Therapy Documentation Precautions:  Precautions Precautions: Fall Precaution Comments: poor safety awareness Restrictions Weight Bearing Restrictions: No  See FIM for current functional status  Therapy/Group: Individual Therapy  Gilmore Laroche 11/27/2013, 5:11 PM

## 2013-11-27 NOTE — Progress Notes (Signed)
Physical Therapy Session Note  Patient Details  Name: [name] [name] MRN: [id number] Date of Birth: 04/03/1946  Today's Date: 11/27/2013 PT Individual Time: 1020-1105 and 1435-1535 PT Individual Time Calculation (min): 45 min and 60 min  Short Term Goals: Week 1:  PT Short Term Goal 1 (Week 1): = LTGs due to anticipated LOS  Skilled Therapeutic Interventions/Progress Updates:   AM Session: Pt at overall supervision level for functional mobility. Pt received sitting in recliner, requesting to use bathroom. Pt stood from recliner after several attempts and ambulated in/out of bathroom using RW, mod I for toileting. Pt electing to propel w/c using BUE and LE intermittently room <> gym with supervision/min A and frequent rest breaks. Pt negotiated up/down 5 stairs x 2 using 2 rails, supervision. Gait training x 180 ft using bariatric RW, supervision, pt ambulating with forward flexed posture and lateral leans due to premorbid status and back pain. Pt reporting she feels that the RW will be a barrier at home due to not having used one before. Pt performed mini squats using RW for UE support and seated LAQ, toe raises, and heel raises x 10 each and seated marching x 20. Pt returned to room and left sitting in w/c with all needs within reach.   PM Session: Pt received seated in recliner, agreeable to therapy. Pt ambulated to/from bathroom using RW and supervision, mod I for toileting. Pt ambulated x 200 ft and 2 x 50 ft using bariatric RW and distant supervision. Vitals monitored and WFL. Pt expressing concern over who will carry RW up/down stairs at d/c although recommendation from therapy team is for patient to have supervision with stair negotiation due to B knee pain/back pain. Pt negotiated up/down 4 steps and up/down 5 steps using 1 rail and SPC, distant supervision. Pt attempted ambulation using SPC; however, continuously reaching for wall, rail, or other pieces of furniture for improved stability due  to decreased balance, decreased overall strength, and decreased safety awareness. Pt reporting, "I wear shoes at home and my feet are slipping," but reports no one available to bring shoes from home and demonstrated no difficulty ambulating using RW. Pt performed car transfer using RW with distant supervision, initial demonstration for technique. Pt ambulated in home environment 2 x 10 ft using SPC and close supervision and transferred sit <> supine on regular bed with mod I. Pt reporting concerns that she is not practicing what she does at home. When questioned regarding what has not been covered in therapy, pt reports that she "thought I would be doing more arm exercises to strengthen my muscles." Pt instructed in seated w/c level UE exercises using 2# DB. Pt requesting to stay in therapy gym until next session.   Therapy Documentation Precautions:  Precautions Precautions: Fall Precaution Comments: poor safety awareness Restrictions Weight Bearing Restrictions: No Pain: Pain Assessment Pain Assessment: No/denies pain Pain Score: 0-No pain  See FIM for current functional status  Therapy/Group: Individual Therapy  Laretta Alstrom 11/27/2013, 12:22 PM

## 2013-11-27 NOTE — Progress Notes (Signed)
Social Work Patient ID: [name] [name], female   DOB: 1945/05/15, 68 y.o.   MRN: [id number] Team feels pt would be ready for discharge on Wed at mod/i level.  Checked with MD who reports cardiology to see, await their recommendation. Discussed DME needs and follow up.  She wants to use a cane like she used at home she feels the rolling walker is too cumbersome and will not be Feasible to use at home.  Have asked Rebekah-PT who will follow up with pt.  Work on discharge needs.

## 2013-11-27 NOTE — Progress Notes (Signed)
68 y.o. female with history of CHF, morbid obesity, SVT, PAC, PVS's, mild AS who was admitted on 11/17/13 with SOB, weeping edema BLE, fatigue as well as nausea and vomiting. She was treated for sepsis as work up with leucocytosis due to E coli UTI as well as BLE cellulitis. 2D echo with EF 60-65 with grade 2 diastolic dysfunction. Pro BNP-1875 and CXR without edema. She was treated with IV diuretics but has had worsening of renal status. She developed 2nd degree heart block and metoprolol d/c. Dr. Radford Pax was consulted for input and recommended restarting Lorsartan as well as cardiac monitoring till BB washed out  Subjective/Complaints: Appreciate cardiology note Tachy resolved No CP or SOB  Review of Systems - Negative except fatigue  Objective: Vital Signs: Blood pressure 111/58, pulse 62, temperature 98.5 F (36.9 C), temperature source Oral, resp. rate 18, height 5' 11" (1.803 m), weight 148.689 kg (327 lb 12.8 oz), SpO2 96.00%. No results found. Results for orders placed during the hospital encounter of 11/23/13 (from the past 72 hour(s))  GLUCOSE, CAPILLARY     Status: None   Collection Time    11/24/13 11:24 AM      Result Value Ref Range   Glucose-Capillary 98  70 - 99 mg/dL  GLUCOSE, CAPILLARY     Status: Abnormal   Collection Time    11/24/13  5:10 PM      Result Value Ref Range   Glucose-Capillary 101 (*) 70 - 99 mg/dL  GLUCOSE, CAPILLARY     Status: Abnormal   Collection Time    11/24/13  8:41 PM      Result Value Ref Range   Glucose-Capillary 117 (*) 70 - 99 mg/dL  GLUCOSE, CAPILLARY     Status: Abnormal   Collection Time    11/25/13  6:56 AM      Result Value Ref Range   Glucose-Capillary 102 (*) 70 - 99 mg/dL  BASIC METABOLIC PANEL     Status: Abnormal   Collection Time    11/25/13  7:30 AM      Result Value Ref Range   Sodium 143  137 - 147 mEq/L   Potassium 4.6  3.7 - 5.3 mEq/L   Chloride 105  96 - 112 mEq/L   CO2 24  19 - 32 mEq/L   Glucose, Bld 97  70 -  99 mg/dL   BUN 15  6 - 23 mg/dL   Creatinine, Ser 1.58 (*) 0.50 - 1.10 mg/dL   Calcium 9.2  8.4 - 10.5 mg/dL   GFR calc non Af Amer 33 (*) >90 mL/min   GFR calc Af Amer 38 (*) >90 mL/min   Comment: (NOTE)     The eGFR has been calculated using the CKD EPI equation.     This calculation has not been validated in all clinical situations.     eGFR's persistently <90 mL/min signify possible Chronic Kidney     Disease.   Anion gap 14  5 - 15  GLUCOSE, CAPILLARY     Status: Abnormal   Collection Time    11/25/13 11:24 AM      Result Value Ref Range   Glucose-Capillary 102 (*) 70 - 99 mg/dL  GLUCOSE, CAPILLARY     Status: Abnormal   Collection Time    11/25/13  4:39 PM      Result Value Ref Range   Glucose-Capillary 105 (*) 70 - 99 mg/dL  GLUCOSE, CAPILLARY     Status: Abnormal  Collection Time    11/25/13  8:57 PM      Result Value Ref Range   Glucose-Capillary 110 (*) 70 - 99 mg/dL  BASIC METABOLIC PANEL     Status: Abnormal   Collection Time    11/26/13  6:15 AM      Result Value Ref Range   Sodium 139  137 - 147 mEq/L   Potassium 4.2  3.7 - 5.3 mEq/L   Chloride 102  96 - 112 mEq/L   CO2 24  19 - 32 mEq/L   Glucose, Bld 109 (*) 70 - 99 mg/dL   BUN 19  6 - 23 mg/dL   Creatinine, Ser 1.57 (*) 0.50 - 1.10 mg/dL   Calcium 9.4  8.4 - 10.5 mg/dL   GFR calc non Af Amer 33 (*) >90 mL/min   GFR calc Af Amer 38 (*) >90 mL/min   Comment: (NOTE)     The eGFR has been calculated using the CKD EPI equation.     This calculation has not been validated in all clinical situations.     eGFR's persistently <90 mL/min signify possible Chronic Kidney     Disease.   Anion gap 13  5 - 15  GLUCOSE, CAPILLARY     Status: Abnormal   Collection Time    11/26/13  6:31 AM      Result Value Ref Range   Glucose-Capillary 105 (*) 70 - 99 mg/dL  GLUCOSE, CAPILLARY     Status: Abnormal   Collection Time    11/26/13 11:35 AM      Result Value Ref Range   Glucose-Capillary 118 (*) 70 - 99 mg/dL   BASIC METABOLIC PANEL     Status: Abnormal   Collection Time    11/26/13  3:18 PM      Result Value Ref Range   Sodium 137  137 - 147 mEq/L   Potassium 4.0  3.7 - 5.3 mEq/L   Chloride 99  96 - 112 mEq/L   CO2 24  19 - 32 mEq/L   Glucose, Bld 138 (*) 70 - 99 mg/dL   BUN 18  6 - 23 mg/dL   Creatinine, Ser 1.62 (*) 0.50 - 1.10 mg/dL   Calcium 9.3  8.4 - 10.5 mg/dL   GFR calc non Af Amer 32 (*) >90 mL/min   GFR calc Af Amer 37 (*) >90 mL/min   Comment: (NOTE)     The eGFR has been calculated using the CKD EPI equation.     This calculation has not been validated in all clinical situations.     eGFR's persistently <90 mL/min signify possible Chronic Kidney     Disease.   Anion gap 14  5 - 15  GLUCOSE, CAPILLARY     Status: Abnormal   Collection Time    11/26/13  4:41 PM      Result Value Ref Range   Glucose-Capillary 138 (*) 70 - 99 mg/dL   Comment 1 Notify RN    GLUCOSE, CAPILLARY     Status: Abnormal   Collection Time    11/26/13  9:15 PM      Result Value Ref Range   Glucose-Capillary 115 (*) 70 - 99 mg/dL  GLUCOSE, CAPILLARY     Status: None   Collection Time    11/27/13  6:47 AM      Result Value Ref Range   Glucose-Capillary 95  70 - 99 mg/dL      Nursing note and vitals  reviewed.  Constitutional: She is oriented to person, place, and time. She appears well-developed and well-nourished.  Morbidly obese  HENT:  Head: Normocephalic and atraumatic.  Eyes: Conjunctivae are normal. Pupils are equal, round, and reactive to light.  Neck: Normal range of motion. Neck supple.  Cardiovascular: Normal rate and regular rhythm.  Murmur heard.  Respiratory: Effort normal and breath sounds normal. No respiratory distress. She has no wheezes.  GI: Soft. Bowel sounds are normal. She exhibits no distension. There is no tenderness.  Musculoskeletal: Tenderness right 11th and 12th ribs posterior lateral  Lymphedema BLE --L>R with resolving erythema left shin. Ichthyosis bilateral  shins. LUE with limitations at shoulder due to RTC pathology. Positive drop arm test  Neurological: She is alert and oriented to person, place, and time.  UES: 5/5 prox to distal. LES: 3+ HF, 4 KE and 4/5 ankles bilaterally. No gross sensory deficits to LT.  Skin: Skin is warm and dry.  Psychiatric: Her behavior is normal. Judgment and thought content normal. Her affect is blunt   Assessment/Plan: 1. Functional deficits secondary to deconditioning following urosepsis which require 3+ hours per day of interdisciplinary therapy in a comprehensive inpatient rehab setting. Physiatrist is providing close team supervision and 24 hour management of active medical problems listed below. Physiatrist and rehab team continue to assess barriers to discharge/monitor patient progress toward functional and medical goals. FIM: FIM - Bathing Bathing Steps Patient Completed: Chest;Right Arm;Left Arm;Abdomen;Front perineal area;Buttocks;Right upper leg;Left upper leg;Right lower leg (including foot);Left lower leg (including foot) Bathing: 5: Set-up assist to: Open items  FIM - Upper Body Dressing/Undressing Upper body dressing/undressing steps patient completed: Thread/unthread right sleeve of pullover shirt/dresss;Thread/unthread left sleeve of pullover shirt/dress;Put head through opening of pull over shirt/dress;Pull shirt over trunk Upper body dressing/undressing: 0: Wears gown/pajamas-no public clothing FIM - Lower Body Dressing/Undressing Lower body dressing/undressing steps patient completed: Thread/unthread right pants leg;Thread/unthread left pants leg;Pull pants up/down;Don/Doff right sock;Don/Doff left sock Lower body dressing/undressing: 0: Wears gown/pajamas-no public clothing  FIM - Toileting Toileting steps completed by patient: Adjust clothing prior to toileting;Performs perineal hygiene;Adjust clothing after toileting Toileting Assistive Devices: Grab bar or rail for support Toileting: 5:  Supervision: Safety issues/verbal cues  FIM - Radio producer Devices: Mining engineer Transfers: 5-To toilet/BSC: Supervision (verbal cues/safety issues);5-From toilet/BSC: Supervision (verbal cues/safety issues)  FIM - Control and instrumentation engineer Devices: Copy: 5: Supine > Sit: Supervision (verbal cues/safety issues);5: Sit > Supine: Supervision (verbal cues/safety issues)  FIM - Locomotion: Wheelchair Distance: 100 Locomotion: Wheelchair: 2: Travels 50 - 149 ft with supervision, cueing or coaxing FIM - Locomotion: Ambulation Locomotion: Ambulation Assistive Devices: Administrator Ambulation/Gait Assistance: 5: Supervision Locomotion: Ambulation: 2: Travels 50 - 149 ft with supervision/safety issues  Comprehension Comprehension Mode: Auditory Comprehension: 6-Follows complex conversation/direction: With extra time/assistive device  Expression Expression Mode: Verbal Expression: 7-Expresses complex ideas: With no assist  Social Interaction Social Interaction: 7-Interacts appropriately with others - No medications needed.  Problem Solving Problem Solving: 6-Solves complex problems: With extra time  Memory Memory: 7-Complete Independence: No helper  Medical Problem List and Plan:  1. Functional deficits secondary to Deconditioning due to sepsis, fluid overload, cardiac issues.  2. DVT Prophylaxis/Anticoagulation: Pharmaceutical: Lovenox  3. Chronic Knee/back pain/Pain Management: Did NOT use Hydrocodone at home--has intolerance of narcotics and continues to have persistent nausea. Will change to ultram and robaxin prn. Heat to bilateral knees and/or back.  4. Mood: LCSW to follow for evaluation and support.  5. Neuropsych: This patient is capable of making decisions on her own behalf.  6. Skin/Wound Care: Pressure relief measures for breakdown prevent.  7. Lymphedema BLE: Continue to monitor. Has  been treated with unna boots at wound center in the past. Add lac hydrin for ichthyosis BLE.  8. AKI: Lasix on hold.GFR stable in 30s  Will hold ace for now. Cr-1.0 at admission. Check serial BMET  9. H/o Palpations:Tachy/Brady syndrome, Electrophysiology considering catheter ablation.  Heart block resolved with decreased dose BB. Has had issues with sinus tachycardia off BB. Monitor HR every 8 hours  10. Constipation: Set bowel program. D/c narcotics.  11. Morbid obesity: Monitor skin and encourage OOB to prevent breakdown.  12. DM type 2: Hgb A1c 6.7--has been boderline diabetic but does not have good grasp of diet. Will consult RD to educate on diet/carb counting. Monitor BS ac/hs and use SSI for elevated BS. Initiate oral agent if indicated.  13. Sepsis due to UTI/Cellulitis: Cellulitis resolved. Edema BLE at baseline per patient. Check follow up culture post treatment. Has urinary freq but no other symptoms currently 13.  Urinary incont, chronic exacerbated by lasix, UA C and S neg  LOS (Days) 4 A FACE TO FACE EVALUATION WAS PERFORMED  KIRSTEINS,ANDREW E 11/27/2013, 7:52 AM

## 2013-11-28 ENCOUNTER — Encounter (HOSPITAL_COMMUNITY): Payer: Medicare Other | Admitting: Occupational Therapy

## 2013-11-28 ENCOUNTER — Inpatient Hospital Stay (HOSPITAL_COMMUNITY): Payer: Medicare Other | Admitting: Physical Therapy

## 2013-11-28 ENCOUNTER — Inpatient Hospital Stay (HOSPITAL_COMMUNITY): Payer: Medicare Other

## 2013-11-28 DIAGNOSIS — R5381 Other malaise: Secondary | ICD-10-CM

## 2013-11-28 DIAGNOSIS — N179 Acute kidney failure, unspecified: Secondary | ICD-10-CM

## 2013-11-28 DIAGNOSIS — Z5189 Encounter for other specified aftercare: Secondary | ICD-10-CM

## 2013-11-28 DIAGNOSIS — I471 Supraventricular tachycardia, unspecified: Secondary | ICD-10-CM | POA: Diagnosis present

## 2013-11-28 LAB — BASIC METABOLIC PANEL
Anion gap: 14 (ref 5–15)
BUN: 19 mg/dL (ref 6–23)
CO2: 25 mEq/L (ref 19–32)
Calcium: 9.1 mg/dL (ref 8.4–10.5)
Chloride: 100 mEq/L (ref 96–112)
Creatinine, Ser: 1.43 mg/dL — ABNORMAL HIGH (ref 0.50–1.10)
GFR calc Af Amer: 43 mL/min — ABNORMAL LOW (ref >90)
GFR calc non Af Amer: 37 mL/min — ABNORMAL LOW (ref >90)
Glucose, Bld: 100 mg/dL — ABNORMAL HIGH (ref 70–99)
Potassium: 4 mEq/L (ref 3.7–5.3)
Sodium: 139 mEq/L (ref 137–147)

## 2013-11-28 LAB — GLUCOSE, CAPILLARY
Glucose-Capillary: 106 mg/dL — ABNORMAL HIGH (ref 70–99)
Glucose-Capillary: 110 mg/dL — ABNORMAL HIGH (ref 70–99)
Glucose-Capillary: 123 mg/dL — ABNORMAL HIGH (ref 70–99)
Glucose-Capillary: 131 mg/dL — ABNORMAL HIGH (ref 70–99)

## 2013-11-28 NOTE — Progress Notes (Signed)
Social Work Patient ID: [name] [name], female   DOB: May 24, 1945, 68 y.o.   MRN: [id number] Met with pt to inform [facility] set up- for follow up home health once she goes home form acute.  She has her rolling walker in her room. Set for transfer tomorrow.  She reached her goals on rehab of mod/i-supervision level and plans to go to her daughter;s home until she is able to return to her own.

## 2013-11-28 NOTE — Progress Notes (Signed)
Physical Therapy Session Note  Patient Details  Name: [name] [name] MRN: [id number] Date of Birth: [birth year]  Today's Date: 11/28/2013 PT Individual Time: 1600-1700 PT Individual Time Calculation (min): 60 min   Short Term Goals: Week 1:  PT Short Term Goal 1 (Week 1): = LTGs due to anticipated LOS  Skilled Therapeutic Interventions/Progress Updates:  1:1. Pt received walking to bathroom, mod(I) in room. Focus this session on functional endurance, functional ambulation and safety in home environment. Attempted discussion regarding AD management into/out of home, however, pt not receptive to feedback for problem solving as she was not open to asking friends/family for assistance as she didn't want to be a burden. Pt declined opportunity at this time to practice walking outside. Pt amb 100'x1 and 225'x1 w/ RW at mod(I) level, pt propelled w/c 150' w/ B UE at mod(I) level. Pt demonstrates very slow, but steady pace for overall mobility. Pt practiced making bed (tx mat) to target endurance and standing balance w/out use of RW. Pt mod(I) overall for task, req 1 seated rest break. Pt practiced picking up various objects around therapy gym to practice utilization of walker bag, req her to reach in all quadrants to challenge balance. Pt amb 100'x2 overall during task. Pt left sitting EOB at end of session w/ all needs in reach.   Therapy Documentation Precautions:  Precautions Precautions: Fall Precaution Comments: cardiac Restrictions Weight Bearing Restrictions: No  See FIM for current functional status  Therapy/Group: Individual Therapy  Gilmore Laroche 11/28/2013, 5:22 PM

## 2013-11-28 NOTE — Progress Notes (Signed)
Social Work Patient ID: [name] [name], female   DOB: 05/14/1945, 68 y.o.   MRN: [id number] Spoke with Cardiology PA who will place pt on schedule tomorrow for her ablation, so will discharge form her and go to acute for her procedure then discharge from acute. Will make discharge arrangements, she has agreed to a rolling walker since it is safer for her.  Will also make home health arrangements, pt unsure if this is really necessary. Work on discharge tomorrow.

## 2013-11-28 NOTE — Progress Notes (Signed)
Physical Therapy Discharge Summary  Patient Details  Name: [name] [name] MRN: [id number] Date of Birth: April 12, 1945  Today's Date: 11/28/2013  Patient has met 8 of 8 long term goals due to improved activity tolerance, improved balance, increased strength, ability to compensate for deficits and instruction in safe use of DME.  Patient to discharge at an ambulatory level Modified Independent-supervision. Patient's care partner unavailable to provide the necessary physical assistance at discharge.  Reasons goals not met: NA  Recommendation:  Patient will benefit from ongoing skilled PT services in home health setting to continue to advance safe functional mobility, address ongoing impairments in activity tolerance, functional strength, balance, safety with SPC, and minimize fall risk.  Equipment: bariatric RW  Reasons for discharge: treatment goals met and planned discharge to acute for cardiac procedure 11/29/2013  Patient/family agrees with progress made and goals achieved: Yes  PT Discharge Precautions/Restrictions Precautions Precautions: Fall Precaution Comments: cardiac Restrictions Weight Bearing Restrictions: No Vital Signs Therapy Vitals Pulse Rate: 95 BP: 114/62 mmHg Patient Position (if appropriate): Sitting (after ambulation) Oxygen Therapy SpO2: 99 % O2 Device: None (Room air) Pain Pain Assessment Pain Assessment: No/denies pain Vision/Perception   No changes from baseline  Cognition Overall Cognitive Status: Within Functional Limits for tasks assessed Arousal/Alertness: Awake/alert Orientation Level: Oriented X4 Safety/Judgment: Appears intact Sensation Sensation Light Touch: Appears Intact Light Touch Impaired Details: Impaired RLE;Impaired LLE Stereognosis: Appears Intact Hot/Cold: Appears Intact Proprioception: Appears Intact Additional Comments: occasional pin pricks/tingling in B feet/legs, possible nerve damage with impaired sensation R lateral  hip/thigh Coordination Gross Motor Movements are Fluid and Coordinated: Yes Fine Motor Movements are Fluid and Coordinated: Yes Heel Shin Test: limited by LE weakness/body habitus Motor  Motor Motor: Abnormal postural alignment and control Motor - Discharge Observations: Pt with flexed trunk in stnading unable to achieve upright standing.  Mobility Bed Mobility Bed Mobility: Supine to Sit;Sit to Supine Supine to Sit: 6: Modified independent (Device/Increase time) Sit to Supine: 6: Modified independent (Device/Increase time) Transfers Transfers: Yes Sit to Stand: 6: Modified independent (Device/Increase time);With upper extremity assist;Without upper extremity assist;With armrests Stand to Sit: 6: Modified independent (Device/Increase time);With armrests;With upper extremity assist Locomotion  Ambulation Ambulation: Yes Ambulation/Gait Assistance: 6: Modified independent (Device/Increase time) Ambulation Distance (Feet): 90 Feet Assistive device: Rolling walker Gait Gait: Yes Gait Pattern: Impaired Gait Pattern: Lateral trunk lean to left;Lateral trunk lean to right;Wide base of support;Trunk flexed;Step-through pattern;Decreased step length - right;Decreased stride length Gait velocity: Decreased Stairs / Additional Locomotion Stairs: Yes Stairs Assistance: 5: Supervision;6: Modified independent (Device/Increase time) Stair Management Technique: One rail Left;With cane;Step to pattern;Forwards Number of Stairs: 5 Height of Stairs: 6 Wheelchair Mobility Wheelchair Mobility: Yes Wheelchair Assistance: 6: Modified independent (Device/Increase time) Environmental health practitioner: Both upper extremities Wheelchair Parts Management: Needs assistance Distance: 150  Trunk/Postural Assessment  Cervical Assessment Cervical Assessment: Within Functional Limits Thoracic Assessment Thoracic Assessment: Exceptions to [facility] Thoracic Strength Overall Thoracic Strength Comments: Pt exhibits  moderate kyphosis in the thoracic region. Lumbar Assessment Lumbar Assessment: Exceptions to [facility] Lumbar Strength Overall Lumbar Strength Comments: Pt with increased lumbar flexion in standing Postural Control Postural Control: Deficits on evaluation Postural Limitations: Pt unable to exhibit trunk extension to neutral with static or dynamic standing.    Balance Balance Balance Assessed: Yes Dynamic Sitting Balance Dynamic Sitting - Balance Support: No upper extremity supported;During functional activity Dynamic Sitting - Level of Assistance: 6: Modified independent (Device/Increase time) Static Standing Balance Static Standing - Balance Support: No upper extremity supported;During functional activity Static  Standing - Level of Assistance: 6: Modified independent (Device/Increase time) Dynamic Standing Balance Dynamic Standing - Balance Support: During functional activity;Right upper extremity supported Dynamic Standing - Level of Assistance: 6: Modified independent (Device/Increase time) Extremity Assessment  RUE Assessment RUE Assessment: Within Functional Limits LUE Strength LUE Overall Strength Comments: Pt with history of left rotator cuff injury.  Shoulder flexion AROM 0-50 degrees and abduction only 0-60 degrees.  All other joints except for the shoulder AROM WFls and strength 4/5 RLE Assessment RLE Assessment: Within Functional Limits (grossly 4 to 4+/5 throughout) LLE Assessment LLE Assessment: Exceptions to [facility] LLE Strength LLE Overall Strength: Deficits;Due to premorbid status LLE Overall Strength Comments: grossly 3+ to 4+/5 throughout  See FIM for current functional status  Laretta Alstrom 11/28/2013, 12:44 PM

## 2013-11-28 NOTE — Progress Notes (Signed)
68 y.o. female with history of CHF, morbid obesity, SVT, PAC, PVS's, mild AS who was admitted on 11/17/13 with SOB, weeping edema BLE, fatigue as well as nausea and vomiting. She was treated for sepsis as work up with leucocytosis due to E coli UTI as well as BLE cellulitis. 2D echo with EF 60-65 with grade 2 diastolic dysfunction. Pro BNP-1875 and CXR without edema. She was treated with IV diuretics but has had worsening of renal status. She developed 2nd degree heart block and metoprolol d/c. Dr. Radford Pax was consulted for input and recommended restarting Lorsartan as well as cardiac monitoring till BB washed out  Subjective/Complaints: Appreciate cardiology note Per pt , electrophysiology is planning catheter ablation, followed by observation on telemetry unit No CP or SOB  Review of Systems - Negative except fatigue  Objective: Vital Signs: Blood pressure 118/71, pulse 68, temperature 98.3 F (36.8 C), temperature source Oral, resp. rate 18, height _0  (1.803 m), weight 148.916 kg (328 lb 4.8 oz), SpO2 94.00%. No results found. Results for orders placed during the hospital encounter of 11/23/13 (from the past 72 hour(s))  GLUCOSE, CAPILLARY     Status: Abnormal   Collection Time    11/25/13 11:24 AM      Result Value Ref Range   Glucose-Capillary 102 (*) 70 - 99 mg/dL  GLUCOSE, CAPILLARY     Status: Abnormal   Collection Time    11/25/13  4:39 PM      Result Value Ref Range   Glucose-Capillary 105 (*) 70 - 99 mg/dL  GLUCOSE, CAPILLARY     Status: Abnormal   Collection Time    11/25/13  8:57 PM      Result Value Ref Range   Glucose-Capillary 110 (*) 70 - 99 mg/dL  BASIC METABOLIC PANEL     Status: Abnormal   Collection Time    11/26/13  6:15 AM      Result Value Ref Range   Sodium 139  137 - 147 mEq/L   Potassium 4.2  3.7 - 5.3 mEq/L   Chloride 102  96 - 112 mEq/L   CO2 24  19 - 32 mEq/L   Glucose, Bld 109 (*) 70 - 99 mg/dL   BUN 19  6 - 23 mg/dL   Creatinine, Ser 1.57 (*)  0.50 - 1.10 mg/dL   Calcium 9.4  8.4 - 10.5 mg/dL   GFR calc non Af Amer 33 (*) >90 mL/min   GFR calc Af Amer 38 (*) >90 mL/min   Comment: (NOTE)     The eGFR has been calculated using the CKD EPI equation.     This calculation has not been validated in all clinical situations.     eGFR's persistently <90 mL/min signify possible Chronic Kidney     Disease.   Anion gap 13  5 - 15  GLUCOSE, CAPILLARY     Status: Abnormal   Collection Time    11/26/13  6:31 AM      Result Value Ref Range   Glucose-Capillary 105 (*) 70 - 99 mg/dL  GLUCOSE, CAPILLARY     Status: Abnormal   Collection Time    11/26/13 11:35 AM      Result Value Ref Range   Glucose-Capillary 118 (*) 70 - 99 mg/dL  BASIC METABOLIC PANEL     Status: Abnormal   Collection Time    11/26/13  3:18 PM      Result Value Ref Range   Sodium 137  137 -  147 mEq/L   Potassium 4.0  3.7 - 5.3 mEq/L   Chloride 99  96 - 112 mEq/L   CO2 24  19 - 32 mEq/L   Glucose, Bld 138 (*) 70 - 99 mg/dL   BUN 18  6 - 23 mg/dL   Creatinine, Ser 1.62 (*) 0.50 - 1.10 mg/dL   Calcium 9.3  8.4 - 10.5 mg/dL   GFR calc non Af Amer 32 (*) >90 mL/min   GFR calc Af Amer 37 (*) >90 mL/min   Comment: (NOTE)     The eGFR has been calculated using the CKD EPI equation.     This calculation has not been validated in all clinical situations.     eGFR's persistently <90 mL/min signify possible Chronic Kidney     Disease.   Anion gap 14  5 - 15  GLUCOSE, CAPILLARY     Status: Abnormal   Collection Time    11/26/13  4:41 PM      Result Value Ref Range   Glucose-Capillary 138 (*) 70 - 99 mg/dL   Comment 1 Notify RN    GLUCOSE, CAPILLARY     Status: Abnormal   Collection Time    11/26/13  9:15 PM      Result Value Ref Range   Glucose-Capillary 115 (*) 70 - 99 mg/dL  GLUCOSE, CAPILLARY     Status: None   Collection Time    11/27/13  6:47 AM      Result Value Ref Range   Glucose-Capillary 95  70 - 99 mg/dL  BASIC METABOLIC PANEL     Status: Abnormal    Collection Time    11/27/13  7:11 AM      Result Value Ref Range   Sodium 141  137 - 147 mEq/L   Potassium 4.2  3.7 - 5.3 mEq/L   Chloride 103  96 - 112 mEq/L   CO2 25  19 - 32 mEq/L   Glucose, Bld 98  70 - 99 mg/dL   BUN 18  6 - 23 mg/dL   Creatinine, Ser 1.47 (*) 0.50 - 1.10 mg/dL   Calcium 9.1  8.4 - 10.5 mg/dL   GFR calc non Af Amer 36 (*) >90 mL/min   GFR calc Af Amer 41 (*) >90 mL/min   Comment: (NOTE)     The eGFR has been calculated using the CKD EPI equation.     This calculation has not been validated in all clinical situations.     eGFR's persistently <90 mL/min signify possible Chronic Kidney     Disease.   Anion gap 13  5 - 15  GLUCOSE, CAPILLARY     Status: Abnormal   Collection Time    11/27/13 11:45 AM      Result Value Ref Range   Glucose-Capillary 100 (*) 70 - 99 mg/dL  GLUCOSE, CAPILLARY     Status: Abnormal   Collection Time    11/27/13  4:56 PM      Result Value Ref Range   Glucose-Capillary 114 (*) 70 - 99 mg/dL  GLUCOSE, CAPILLARY     Status: Abnormal   Collection Time    11/27/13  8:30 PM      Result Value Ref Range   Glucose-Capillary 112 (*) 70 - 99 mg/dL  GLUCOSE, CAPILLARY     Status: Abnormal   Collection Time    11/28/13  7:01 AM      Result Value Ref Range   Glucose-Capillary 106 (*) 70 -  99 mg/dL      Nursing note and vitals reviewed.  Constitutional: She is oriented to person, place, and time. She appears well-developed and well-nourished.  Morbidly obese  HENT:  Head: Normocephalic and atraumatic.  Eyes: Conjunctivae are normal. Pupils are equal, round, and reactive to light.  Neck: Normal range of motion. Neck supple.  Cardiovascular: Normal rate and regular rhythm.  Murmur heard.  Respiratory: Effort normal and breath sounds normal. No respiratory distress. She has no wheezes.  GI: Soft. Bowel sounds are normal. She exhibits no distension. There is no tenderness.  Musculoskeletal: Tenderness right 11th and 12th ribs posterior  lateral  Lymphedema BLE --L>R with resolving erythema left shin. Ichthyosis bilateral shins. LUE with limitations at shoulder due to RTC pathology. Positive drop arm test  Neurological: She is alert and oriented to person, place, and time.  UES: 5/5 prox to distal. LES: 3+ HF, 4 KE and 4/5 ankles bilaterally. No gross sensory deficits to LT.  Skin: Skin is warm and dry.  Psychiatric: Her behavior is normal. Judgment and thought content normal. Her affect is blunt   Assessment/Plan: 1. Functional deficits secondary to deconditioning following urosepsis which require 3+ hours per day of interdisciplinary therapy in a comprehensive inpatient rehab setting. Physiatrist is providing close team supervision and 24 hour management of active medical problems listed below. Physiatrist and rehab team continue to assess barriers to discharge/monitor patient progress toward functional and medical goals. FIM: FIM - Bathing Bathing Steps Patient Completed: Chest;Right Arm;Left Arm;Abdomen;Front perineal area;Buttocks;Right upper leg;Left upper leg;Right lower leg (including foot);Left lower leg (including foot) Bathing: 6: More than reasonable amount of time  FIM - Upper Body Dressing/Undressing Upper body dressing/undressing steps patient completed: Thread/unthread right sleeve of pullover shirt/dresss;Thread/unthread left sleeve of pullover shirt/dress;Put head through opening of pull over shirt/dress;Pull shirt over trunk Upper body dressing/undressing: 0: Wears gown/pajamas-no public clothing FIM - Lower Body Dressing/Undressing Lower body dressing/undressing steps patient completed: Thread/unthread right underwear leg;Thread/unthread left underwear leg;Pull underwear up/down;Don/Doff left sock;Don/Doff right sock Lower body dressing/undressing: 6: More than reasonable amount of time  FIM - Toileting Toileting steps completed by patient: Adjust clothing prior to toileting;Performs perineal  hygiene;Adjust clothing after toileting Toileting Assistive Devices: Grab bar or rail for support Toileting: 6: More than reasonable amount of time  FIM - Radio producer Devices: Walker;Grab bars Toilet Transfers: 5-To toilet/BSC: Supervision (verbal cues/safety issues);5-From toilet/BSC: Supervision (verbal cues/safety issues)  FIM - Control and instrumentation engineer Devices: Copy: 5: Bed > Chair or W/C: Supervision (verbal cues/safety issues);5: Chair or W/C > Bed: Supervision (verbal cues/safety issues);6: Supine > Sit: No assist;6: Sit > Supine: No assist  FIM - Locomotion: Wheelchair Distance: 150 Locomotion: Wheelchair: 2: Travels 50 - 149 ft with supervision, cueing or coaxing FIM - Locomotion: Ambulation Locomotion: Ambulation Assistive Devices: Administrator Ambulation/Gait Assistance: 5: Supervision Locomotion: Ambulation: 2: Travels 50 - 149 ft with supervision/safety issues  Comprehension Comprehension Mode: Auditory Comprehension: 7-Follows complex conversation/direction: With no assist  Expression Expression Mode: Verbal Expression: 7-Expresses complex ideas: With no assist  Social Interaction Social Interaction: 7-Interacts appropriately with others - No medications needed.  Problem Solving Problem Solving: 7-Solves complex problems: Recognizes & self-corrects  Memory Memory: 7-Complete Independence: No helper  Medical Problem List and Plan:  1. Functional deficits secondary to Deconditioning due to sepsis, fluid overload, cardiac issues.  2. DVT Prophylaxis/Anticoagulation: Pharmaceutical: Lovenox  3. Chronic Knee/back pain/Pain Management: Did NOT use Hydrocodone at home--has intolerance of narcotics  and continues to have persistent nausea. Will change to ultram and robaxin prn. Heat to bilateral knees and/or back.  4. Mood: LCSW to follow for evaluation and support.  5. Neuropsych: This  patient is capable of making decisions on her own behalf.  6. Skin/Wound Care: Pressure relief measures for breakdown prevent.  7. Lymphedema BLE: Continue to monitor. Has been treated with unna boots at wound center in the past. Add lac hydrin for ichthyosis BLE.  8. AKI: Lasix resumed low dose on 8/25 GFR stable in 30s  Will hold ace for now. Cr-1.0 at admission. Check serial BMET  9. H/o Palpations:Tachy/Brady syndrome, Electrophysiology planning catheter ablation.  Heart block resolved with decreased dose BB. Has had issues with sinus tachycardia off BB. Monitor HR every 8 hours  10. Constipation: Set bowel program. D/c narcotics.  11. Morbid obesity: Monitor skin and encourage OOB to prevent breakdown.  12. DM type 2: Hgb A1c 6.7--has been boderline diabetic  Monitor BS ac/hs and use SSI for elevated BS. Initiate oral agent if indicated.     LOS (Days) 5 A FACE TO FACE EVALUATION WAS PERFORMED  Alayah Knouff E 11/28/2013, 8:08 AM

## 2013-11-28 NOTE — Progress Notes (Signed)
Physical Therapy Session Note  Patient Details  Name: [name] [name] MRN: [id number] Date of Birth: 05-31-1945  Today's Date: 11/28/2013 PT Individual Time: 0830-0930 PT Individual Time Calculation (min): 60 min   Short Term Goals: Week 1:  PT Short Term Goal 1 (Week 1): = LTGs due to anticipated LOS  Skilled Therapeutic Interventions/Progress Updates:   Pt received sitting EOB, reporting "feeling kinda funny" today. Vitals assessed and BP WFL, HR 80. After activity, BP WFL and BP increased to 95 but recovering back to 80 within 60 seconds. See chart for details. Continued discussion regarding DME needs with therapist recommending patient use bariatric RW at home for improved safety and balance and SPC for stair negotiation. Pt verbalized understanding. Pt propelled w/c using BUEs x 150 ft and x 100 ft, mod I for UE strengthening and activity tolerance. Gait training in controlled environment x 90 ft using RW, mod I. Pt performed car transfer from w/c with supervision. Pt with episode of dizziness with car transfer requiring seated rest break. Pt negotiated up/down 4 stairs using 1 rail and SPC, supervision due to pt grimacing with c/o chronic knee stiffness and pain. Pt walked 2 x 5 ft with SPC and supervision in home environment to/from bed and performed bed mobility on regular bed with mod I. Pt able to stand from EOB without UE support. Pt with uncontrolled descent with all functional transfers despite cues, perhaps contributing to dizziness this session. Pt returned to room and left seated in w/c, RN present and notified of patient status. Patient to discharge to acute care tomorrow for procedure and discharge home when medically stable.   Therapy Documentation Precautions:  Precautions Precautions: Fall Precaution Comments: poor safety awareness Restrictions Weight Bearing Restrictions: No Vital Signs: Therapy Vitals Pulse Rate: 95 BP: 114/62 mmHg Patient Position (if appropriate):  Sitting (after ambulation) Oxygen Therapy SpO2: 99 % O2 Device: None (Room air) Pain:  Unrated B knee pain with stair negotiation  See FIM for current functional status  Therapy/Group: Individual Therapy  Laretta Alstrom 11/28/2013, 10:41 AM

## 2013-11-28 NOTE — Progress Notes (Addendum)
Patient ID: [name] [name], female   DOB: April 04, 1946, 68 y.o.   MRN: [id number]   Patient Name: [name] [name] Date of Encounter: 11/28/2013     Principal Problem:   Physical deconditioning Active Problems:   Morbid obesity   Venous stasis of lower extremity   SVT (supraventricular tachycardia)   General weakness   Multilevel spine pain   Acute kidney injury   Paroxysmal SVT (supraventricular tachycardia)    SUBJECTIVE  Denies palpitations, chest pain or sob.  CURRENT MEDS . ammonium lactate   Topical BID  . aspirin EC  81 mg Oral Q breakfast  . enoxaparin (LOVENOX) injection  75 mg Subcutaneous Q24H  . furosemide  20 mg Oral BID  . hydrocerin   Topical BID  . insulin aspart  0-15 Units Subcutaneous TID WC  . metoprolol tartrate  25 mg Oral BID  . MUSCLE RUB   Topical TID AC  . pantoprazole  40 mg Oral Daily  . senna-docusate  2 tablet Oral QHS    OBJECTIVE  Filed Vitals:   11/28/13 0850 11/28/13 0909 11/28/13 1447 11/28/13 2134  BP: 114/62  108/57 132/56  Pulse: 82 95 61 64  Temp:   98.6 F (37 C) 98 F (36.7 C)  TempSrc:   Oral Oral  Resp:   18 17  Height:      Weight:      SpO2: 99%  100% 98%    Intake/Output Summary (Last 24 hours) at 11/28/13 2149 Last data filed at 11/28/13 1800  Gross per 24 hour  Intake   1220 ml  Output      0 ml  Net   1220 ml   Filed Weights   11/25/13 0634 11/27/13 0443 11/28/13 0500  Weight: 331 lb 8 oz (150.367 kg) 327 lb 12.8 oz (148.689 kg) 328 lb 4.8 oz (148.916 kg)    PHYSICAL EXAM  General: Pleasant, NAD. Neuro: Alert and oriented X 3. Moves all extremities spontaneously. Psych: Normal affect. HEENT:  Normal  Neck: Supple without bruits or JVD. Lungs:  Resp regular and unlabored, CTA. Heart: RRR no s3, s4, or murmurs. Abdomen: Soft, non-tender, non-distended, BS + x 4.  Extremities: No clubbing, chronic woody edema, 3+, DP/PT/Radials 2+ and equal bilaterally.  Accessory Clinical Findings  CBC No  results found for this basename: WBC, NEUTROABS, HGB, HCT, MCV, PLT,  in the last 72 hours Basic Metabolic Panel  Recent Labs  11/27/13 0711 11/28/13 0640  NA 141 139  K 4.2 4.0  CL 103 100  CO2 25 25  GLUCOSE 98 100*  BUN 18 19  CREATININE 1.47* 1.43*  CALCIUM 9.1 9.1   Liver Function Tests No results found for this basename: AST, ALT, ALKPHOS, BILITOT, PROT, ALBUMIN,  in the last 72 hours No results found for this basename: LIPASE, AMYLASE,  in the last 72 hours Cardiac Enzymes No results found for this basename: CKTOTAL, CKMB, CKMBINDEX, TROPONINI,  in the last 72 hours BNP No components found with this basename: POCBNP,  D-Dimer No results found for this basename: DDIMER,  in the last 72 hours Hemoglobin A1C No results found for this basename: HGBA1C,  in the last 72 hours Fasting Lipid Panel No results found for this basename: CHOL, HDL, LDLCALC, TRIG, CHOLHDL, LDLDIRECT,  in the last 72 hours Thyroid Function Tests No results found for this basename: TSH, T4TOTAL, FREET3, T3FREE, THYROIDAB,  in the last 72 hours    ECG  NSR  Radiology/Studies  Dg Chest 2  View  11/17/2013   CLINICAL DATA:  Difficulty breathing  EXAM: CHEST  2 VIEW  COMPARISON:  August 02, 2013 chest radiograph and chest CT  FINDINGS: There is no edema or consolidation. The heart size and pulmonary vascularity are normal. No adenopathy. There is degenerative change in the thoracic spine.  IMPRESSION: No edema or consolidation.   Electronically Signed   By: Lowella Grip M.D.   On: 11/17/2013 11:27   Dg Lumbar Spine Complete  11/17/2013   CLINICAL DATA:  Weakness, CVA rib back pain  EXAM: LUMBAR SPINE - COMPLETE 4+ VIEW  COMPARISON:  None.  FINDINGS: Five views of lumbar spine submitted. There is disc space flattening with anterior spurring at L4-L5 and L5-S1 level. Narrowing of neural foramina noted bilaterally at L5-S1 level. Facet degenerative changes L3-L4 and L5 level. Anterior spurring upper  endplate of L4 vertebral body. Degenerative changes lower thoracic spine. No acute fracture or subluxation.  IMPRESSION: No acute fracture or subluxation. Degenerative changes as described above.   Electronically Signed   By: Lahoma Crocker M.D.   On: 11/17/2013 11:38   Dg Pelvis 1-2 Views  11/17/2013   CLINICAL DATA:  Fall.  Pelvic pain  EXAM: PELVIS - 1-2 VIEW  COMPARISON:  None.  FINDINGS: There is no evidence of pelvic fracture or diastasis. No other pelvic bone lesions are seen. Mild degenerative changes seen involving the pubic symphysis and hip joints.  IMPRESSION: No acute findings.   Electronically Signed   By: Earle Gell M.D.   On: 11/17/2013 11:35    ASSESSMENT AND PLAN  1. Recurrent SVT I have discussed the treatment options with the patient and recommended proceeding with catheter ablation. This has been tentatively scheduled to be carried out tomorrow.  Gregg Taylor,M.D.  11/28/2013 9:49 PM

## 2013-11-28 NOTE — Progress Notes (Signed)
Occupational Therapy Discharge Summary  Patient Details  Name: [name] [name] MRN: [id number] Date of Birth: 25-Aug-1945  Today's Date: 11/28/2013 OT Individual Time: 1100-1200 OT Individual Time Calculation (min): 60 min  GRAD DAY!! Pt reports her only pair of clean clothes are still wet from yesterday so had to don clean gowns today. Self care retraining including bathing with a basin sitting on the toilet and donning clean gowns and socks. Focused on activity tolerance, sit to stands, dynamic standing balance, picking items off the floor, setting self up with supplies, home management tasks in her room, d/c planning, and energy conservation ,etc.    Patient has met 11 of 11 long term goals due to improved activity tolerance, improved balance, postural control and ability to compensate for deficits.  Patient to discharge at overall Modified Independent level, and uses a RW for functional mobility.    Reasons goals not met: n/a  Recommendation:  Patient will benefit from ongoing skilled OT services in home health setting to continue to advance functional skills in the area of iADL.  Equipment: No equipment provided  Reasons for discharge: treatment goals met and discharge from hospital  Patient/family agrees with progress made and goals achieved: Yes  OT Discharge Precautions/Restrictions  Precautions Precautions: Fall Precaution Comments: cardiac Restrictions Weight Bearing Restrictions: No    Vital Signs Therapy Vitals Pulse Rate: 95 BP: 114/62 mmHg Patient Position (if appropriate): Sitting (after ambulation) Oxygen Therapy SpO2: 99 % O2 Device: None (Room air) Pain Pain Assessment Pain Assessment: No/denies pain ADL ADL ADL Comments: see FIM Vision/Perception  Vision- History Baseline Vision/History: Wears glasses;Cataracts Wears Glasses: Reading only Patient Visual Report: No change from baseline Vision- Assessment Vision Assessment?: No apparent visual  deficits  Cognition Overall Cognitive Status: Within Functional Limits for tasks assessed Arousal/Alertness: Awake/alert Orientation Level: Oriented X4 Safety/Judgment: Appears intact Sensation Sensation Light Touch: Appears Intact Light Touch Impaired Details: Impaired RLE;Impaired LLE Stereognosis: Appears Intact Hot/Cold: Appears Intact Proprioception: Appears Intact Additional Comments: occasional pin pricks/tingling in B feet/legs, possible nerve damage with impaired sensation R lateral hip/thigh Motor  Motor Motor: Abnormal postural alignment and control Motor - Discharge Observations: Pt with flexed trunk in stnading unable to achieve upright standing. Mobility  Transfers Sit to Stand: 6: Modified independent (Device/Increase time) Stand to Sit: 6: Modified independent (Device/Increase time)  Trunk/Postural Assessment  Cervical Assessment Cervical Assessment: Within Functional Limits Thoracic Strength Overall Thoracic Strength Comments: Pt exhibits moderate kyphosis in the thoracic region. Lumbar Strength Overall Lumbar Strength Comments: Pt with increased lumbar flexion in standing Postural Control Postural Limitations: Pt unable to exhibit trunk extension to neutral with static or dynamic standing.    Balance Balance Balance Assessed: Yes Dynamic Sitting Balance Dynamic Sitting - Level of Assistance: 6: Modified independent (Device/Increase time) Static Standing Balance Static Standing - Level of Assistance: 6: Modified independent (Device/Increase time) Dynamic Standing Balance Dynamic Standing - Level of Assistance: 6: Modified independent (Device/Increase time) Extremity/Trunk Assessment RUE Assessment RUE Assessment: Within Functional Limits LUE Strength LUE Overall Strength Comments: Pt with history of left rotator cuff injury.  Shoulder flexion AROM 0-50 degrees and abduction only 0-60 degrees.  All other joints except for the shoulder AROM WFls and strength  4/5  See FIM for current functional status  Willeen Cass [facility] 11/28/2013, 11:51 AM

## 2013-11-29 ENCOUNTER — Encounter (HOSPITAL_COMMUNITY): Payer: Self-pay | Admitting: General Practice

## 2013-11-29 ENCOUNTER — Encounter (HOSPITAL_COMMUNITY)
Admission: RE | Disposition: A | Discharge status: Other Acute Inpatient Hospital | Payer: Self-pay | Source: Intra-hospital | Attending: Physical Medicine & Rehabilitation

## 2013-11-29 ENCOUNTER — Encounter (HOSPITAL_COMMUNITY)
Admission: RE | Disposition: A | Discharge status: Home / Self Care | Payer: Self-pay | Source: Ambulatory Visit | Attending: Internal Medicine

## 2013-11-29 ENCOUNTER — Ambulatory Visit (HOSPITAL_COMMUNITY)
Admission: RE | Admit: 2013-11-29 | Discharge: 2013-11-30 | Disposition: A | Discharge status: Home / Self Care | Payer: Medicare Other | Source: Ambulatory Visit | Attending: Internal Medicine | Admitting: Internal Medicine

## 2013-11-29 DIAGNOSIS — Z87891 Personal history of nicotine dependence: Secondary | ICD-10-CM | POA: Insufficient documentation

## 2013-11-29 DIAGNOSIS — I471 Supraventricular tachycardia, unspecified: Secondary | ICD-10-CM | POA: Insufficient documentation

## 2013-11-29 DIAGNOSIS — L03116 Cellulitis of left lower limb: Secondary | ICD-10-CM

## 2013-11-29 DIAGNOSIS — I5030 Unspecified diastolic (congestive) heart failure: Secondary | ICD-10-CM | POA: Insufficient documentation

## 2013-11-29 DIAGNOSIS — M1990 Unspecified osteoarthritis, unspecified site: Secondary | ICD-10-CM | POA: Insufficient documentation

## 2013-11-29 DIAGNOSIS — I498 Other specified cardiac arrhythmias: Secondary | ICD-10-CM | POA: Diagnosis not present

## 2013-11-29 DIAGNOSIS — Z79899 Other long term (current) drug therapy: Secondary | ICD-10-CM | POA: Insufficient documentation

## 2013-11-29 DIAGNOSIS — Z7982 Long term (current) use of aspirin: Secondary | ICD-10-CM | POA: Insufficient documentation

## 2013-11-29 DIAGNOSIS — I509 Heart failure, unspecified: Secondary | ICD-10-CM | POA: Insufficient documentation

## 2013-11-29 DIAGNOSIS — I359 Nonrheumatic aortic valve disorder, unspecified: Secondary | ICD-10-CM | POA: Insufficient documentation

## 2013-11-29 DIAGNOSIS — N390 Urinary tract infection, site not specified: Secondary | ICD-10-CM

## 2013-11-29 DIAGNOSIS — I10 Essential (primary) hypertension: Secondary | ICD-10-CM | POA: Insufficient documentation

## 2013-11-29 DIAGNOSIS — E119 Type 2 diabetes mellitus without complications: Secondary | ICD-10-CM

## 2013-11-29 HISTORY — DX: Gastro-esophageal reflux disease without esophagitis: K21.9

## 2013-11-29 HISTORY — PX: SUPRAVENTRICULAR TACHYCARDIA ABLATION: SHX6106

## 2013-11-29 HISTORY — DX: Pneumonia, unspecified organism: J18.9

## 2013-11-29 HISTORY — DX: Type 2 diabetes mellitus without complications: E11.9

## 2013-11-29 HISTORY — PX: SUPRAVENTRICULAR TACHYCARDIA ABLATION: SHX5492

## 2013-11-29 LAB — GLUCOSE, CAPILLARY
Glucose-Capillary: 103 mg/dL — ABNORMAL HIGH (ref 70–99)
Glucose-Capillary: 99 mg/dL (ref 70–99)
Glucose-Capillary: 117 mg/dL — ABNORMAL HIGH (ref 70–99)
Glucose-Capillary: 147 mg/dL — ABNORMAL HIGH (ref 70–99)
Glucose-Capillary: 136 mg/dL — ABNORMAL HIGH (ref 70–99)

## 2013-11-29 LAB — BASIC METABOLIC PANEL
Anion gap: 18 — ABNORMAL HIGH (ref 5–15)
BUN: 18 mg/dL (ref 6–23)
CO2: 24 mEq/L (ref 19–32)
Calcium: 9.6 mg/dL (ref 8.4–10.5)
Chloride: 98 mEq/L (ref 96–112)
Creatinine, Ser: 1.31 mg/dL — ABNORMAL HIGH (ref 0.50–1.10)
GFR calc Af Amer: 48 mL/min — ABNORMAL LOW (ref >90)
GFR calc non Af Amer: 41 mL/min — ABNORMAL LOW (ref >90)
Glucose, Bld: 123 mg/dL — ABNORMAL HIGH (ref 70–99)
Potassium: 4.2 mEq/L (ref 3.7–5.3)
Sodium: 140 mEq/L (ref 137–147)

## 2013-11-29 SURGERY — SUPRAVENTRICULAR TACHYCARDIA ABLATION
Anesthesia: LOCAL

## 2013-11-29 MED ORDER — SODIUM CHLORIDE 0.9 % IV SOLN: INTRAVENOUS | Status: DC

## 2013-11-29 MED ORDER — BUPIVACAINE HCL (PF) 0.25 % IJ SOLN
INTRAMUSCULAR | Status: AC
  Filled 2013-11-29: qty 60

## 2013-11-29 MED ORDER — ONDANSETRON HCL 4 MG PO TABS: 4.0000 mg | ORAL_TABLET | Freq: Four times a day (QID) | ORAL | Status: DC | PRN

## 2013-11-29 MED ORDER — FENTANYL CITRATE 0.05 MG/ML IJ SOLN
INTRAMUSCULAR | Status: AC
  Filled 2013-11-29: qty 2

## 2013-11-29 MED ORDER — OFF THE BEAT BOOK
Freq: Once | Status: AC
  Administered 2013-11-30: 07:00:00
  Filled 2013-11-29: qty 1

## 2013-11-29 MED ORDER — METOPROLOL TARTRATE 25 MG PO TABS
25.0000 mg | ORAL_TABLET | Freq: Two times a day (BID) | ORAL | Status: DC
  Administered 2013-11-29 – 2013-11-30 (×3): 25 mg via ORAL
  Filled 2013-11-29 (×3): qty 1

## 2013-11-29 MED ORDER — SODIUM CHLORIDE 0.9 % IV SOLN: 250.0000 mL | INTRAVENOUS | Status: DC | PRN

## 2013-11-29 MED ORDER — ACETAMINOPHEN 325 MG PO TABS: 650.0000 mg | ORAL_TABLET | Freq: Four times a day (QID) | ORAL | Status: DC | PRN

## 2013-11-29 MED ORDER — INSULIN ASPART 100 UNIT/ML ~~LOC~~ SOLN: 0.0000 [IU] | Freq: Three times a day (TID) | SUBCUTANEOUS | Status: DC

## 2013-11-29 MED ORDER — MIDAZOLAM HCL 5 MG/5ML IJ SOLN
INTRAMUSCULAR | Status: AC
  Filled 2013-11-29: qty 5

## 2013-11-29 MED ORDER — SODIUM CHLORIDE 0.9 % IJ SOLN: 3.0000 mL | INTRAMUSCULAR | Status: DC | PRN

## 2013-11-29 MED ORDER — MAGNESIUM HYDROXIDE 400 MG/5ML PO SUSP: 30.0000 mL | Freq: Every day | ORAL | Status: DC | PRN

## 2013-11-29 MED ORDER — SODIUM CHLORIDE 0.9 % IJ SOLN
3.0000 mL | Freq: Two times a day (BID) | INTRAMUSCULAR | Status: DC
  Administered 2013-11-29 (×2): 3 mL via INTRAVENOUS

## 2013-11-29 MED ORDER — HYDROCODONE-ACETAMINOPHEN 5-325 MG PO TABS
1.0000 | ORAL_TABLET | ORAL | Status: DC | PRN
  Administered 2013-11-29: 17:00:00 1 via ORAL
  Filled 2013-11-29: qty 1

## 2013-11-29 MED ORDER — ONDANSETRON HCL 4 MG/2ML IJ SOLN: 4.0000 mg | Freq: Four times a day (QID) | INTRAMUSCULAR | Status: DC | PRN

## 2013-11-29 MED ORDER — ASPIRIN EC 81 MG PO TBEC
81.0000 mg | DELAYED_RELEASE_TABLET | Freq: Every day | ORAL | Status: DC
  Administered 2013-11-30: 81 mg via ORAL
  Filled 2013-11-29 (×2): qty 1

## 2013-11-29 MED ORDER — METHOCARBAMOL 500 MG PO TABS: 500.0000 mg | ORAL_TABLET | Freq: Four times a day (QID) | ORAL | Status: DC | PRN

## 2013-11-29 MED ORDER — HEPARIN SODIUM (PORCINE) 5000 UNIT/ML IJ SOLN
5000.0000 [IU] | Freq: Three times a day (TID) | INTRAMUSCULAR | Status: DC
  Administered 2013-11-29 – 2013-11-30 (×2): 5000 [IU] via SUBCUTANEOUS
  Filled 2013-11-29 (×5): qty 1

## 2013-11-29 MED ORDER — PANTOPRAZOLE SODIUM 40 MG PO TBEC
40.0000 mg | DELAYED_RELEASE_TABLET | Freq: Every day | ORAL | Status: DC
  Administered 2013-11-29 – 2013-11-30 (×2): 40 mg via ORAL
  Filled 2013-11-29 (×2): qty 1

## 2013-11-29 MED ORDER — SENNOSIDES-DOCUSATE SODIUM 8.6-50 MG PO TABS
2.0000 | ORAL_TABLET | Freq: Every day | ORAL | Status: DC
  Administered 2013-11-29: 22:00:00 2 via ORAL
  Filled 2013-11-29 (×3): qty 2

## 2013-11-29 MED ORDER — BISACODYL 10 MG RE SUPP: 10.0000 mg | Freq: Every day | RECTAL | Status: DC | PRN

## 2013-11-29 MED ORDER — ACETAMINOPHEN 325 MG PO TABS: 650.0000 mg | ORAL_TABLET | ORAL | Status: DC | PRN

## 2013-11-29 NOTE — Progress Notes (Signed)
IV saline locked. 

## 2013-11-29 NOTE — CV Procedure (Signed)
Electrophysiology procedure note  Preoperative diagnoses: Incessant SVT refractory to medical therapy Postoperative diagnoses: Same as preoperative diagnoses  Introduction. The patient is a 68 year old woman who's had a 5-6 year history of recurrent tachypalpitations and multiple hospitalizations for recurrent SVT. She was on very high-dose beta blocker therapy and ask he presented with heart block secondary to her medications. After discontinuation of her beta blockers, she had recurrent SVT. The patient now presents for catheter ablation.  Description of the procedure: After informed consent was obtained, the patient was taken to the diagnostic electrophysiology laboratory in a fasting state. After the usual preparation and draping, intravenous Versed and sent and are used for sedation. A 6 French hexapolar catheter was inserted percutaneously into the right jugular vein and advanced to the coronary sinus. A 6 French quadripolar catheter was inserted percutaneously into the right femoral vein and advanced to the right ventricle. A 6 French quadripolar catheter was inserted percutaneously into the right femoral vein and advanced to the His bundle region. This insertion of the catheters, the patient went into SVT. Mapping was carried out. The VA interval was 0. The atrial activation was midline. PVCs are placed at the time of his bundle refractoriness and did not pre-excite the atrium. Ventricular pacing terminated the tachycardia. The pacing cycle length was 300 ms. At this point rapid ventricular pacing was carried out from the right ventricle. VA block was demonstrated at 300 ms. During rapid ventricular pacing, the atrial activation sequence was midline and decremental. Next, programmed ventricular stimulation was carried out from the right ventricle at a pacing cycle length of 600 ms. The S1-S2 interval was decreased from 540 ms down to 280 ms where ventricular refractoriness was demonstrated. During  programmed ventricular stimulation, atrial activation was midline and decremental. During programmed ventricular stimulation, there is easily inducible SVT. Next programmed atrial stimulation was carried out at a cycle length of 600 ms. The S1-S2 interval was decreased from 540 ms down to 270 ms. During programmed atrial stimulation, there is easily inducible SVT. Next rapid atrial pacing was carried out at a pacing cycle length of 600 ms. A pacing cycle length was decreased down to 360 ms were A-V block was demonstrated. During rapid atrial pacing, the patient had easily inducible SVT. At this point a 7 [place] quadripolar ablation catheter was inserted into the right femoral vein and advanced under fluoroscopic guidance into the right atrium. Mapping in the region between the tricuspid valve, the coronary sinus, and his bundle was carried out. 2 radiofrequency energy applications were delivered. During radiofrequency energy application, there is accelerated junctional rhythm. VA block was not seen. Following this, additional rapid atrial pacing, programmed atrial stimulation, rapid ventricular pacing, and programmed ventricular stimulation was carried out. There is no inducible SVT. There was residual echo beats noted but no PR greater than RR. The patient was observed for 30 minutes. There is no inducible SVT during this time. The catheters were removed, hemostasis was assured, and the patient was returned to her room in satisfactory condition.  Complications: There is no immediate complications  Results. 1. Baseline ECG. The baseline ECG demonstrated normal sinus rhythm with no ventricular preexcitation. 2. Baseline intervals. The sinus node cycle length was 700 ms. The QRS duration was 110 ms. The AH interval was 103 ms. The HV interval was 40 ms. 3. Rapid ventricular pacing. Rapid ventricular pacing was carried out from the right ventricle demonstrating VA block at 300 ms. During rapid ventricular  pacing, the atrial activation was  midline and decremental. During rapid ventricular pacing, there was inducible SVT. 4. Programmed ventricular stimulation. Programmed ventricular stimulation was carried out from the right ventricle at a pacing cycle length of 600 ms. The S1-S2 interval was stepwise decrease down to 280 ms her ventricular refractoriness was observed. During programmed ventricular stimulation, the atrial activation was midline and decremental. During programmed ventricular stimulation, there was inducible SVT. 5. Programmed atrial stimulation. Programmed atrial stimulation was carried out from a cycle length of 600 ms. S1-S2 interval was stepwise decrease down to 270 ms. During programmed atrial stimulation, there are multiple AH jumps, echo beats, and inducible SVT. Following catheter ablation, there was residual AH jumps. There is also residual echo beats. There is no inducible SVT.  6. Rapid atrial pacing. Rapid atrial pacing was carried out from the atrium at a pacing cycle length of 600 ms and stepwise decrease down to 360 ms were A-V block was demonstrated. Following ablation, A-V block was demonstrated at 480 ms. During rapid atrial pacing prior to ablation, there is easily inducible SVT. 7. Arrhythmias observed. AV node reentrant tachycardia. Initiation was with rapid atrial pacing, programmed atrial stimulation, rapid ventricular pacing, and programmed ventricular stimulation. Cycle length was 490 ms. Method of termination was with rapid ventricular pacing 8. Mapping. Mapping of the patient's SVT demonstrated a normal size and orientation of Koch's triangle.  9. Radiofrequency energy application. 2 radiofrequency energy applications were delivered. During radiofrequency application, there is accelerated junctional rhythm.  Conclusion: Successful catheter ablation of AV node reentrant tachycardia with 2 radiofrequency energy applications. Following ablation, there was no inducible  SVT.  Cristopher Peru, M.D.

## 2013-11-29 NOTE — Progress Notes (Signed)
Patient left floor for procedure via stretcher, escorted by nursing staff.  Appears to be in no immediate distress at this time.  Brita Romp, RN

## 2013-11-29 NOTE — Progress Notes (Signed)
Site area: rt neck  Site Prior to Removal:  Level 0  Pressure Applied For 10 MINUTES  Rt IJ venous sheath  Minutes Beginning at 1145 Manual:  yes Patient Status During Pull:  stable Post Pull Groin Site:  Level 0 Post Pull Instructions Given: yes  Post Pull Pulses Present: na Dressing Applied: yes Bedrest Begins na  Comments: no complications

## 2013-11-29 NOTE — Care Management Note (Addendum)
  Page 2 of 2   11/30/2013     11:20:21 AM CARE MANAGEMENT NOTE 11/30/2013  Patient:  [name] [name], [name] [name]   Account Number:  [id number]  Date Initiated:  11/29/2013  Documentation initiated by:  Mariann Laster  Subjective/Objective Assessment:   Tachycardia     Action/Plan:   CM to follow for disposition needs   Anticipated DC Date:  11/30/2013   Anticipated DC Plan:  HOME/SELF CARE  In-house referral  Clinical Social Worker  [place]  CM consult      [facility] Choice  HOME HEALTH   Choice offered to / List presented to:  C-1 Patient   DME arranged  Vassie Moselle      DME agency  [place] arranged  HH-1 RN  HH-10 DISEASE MANAGEMENT  HH-2 PT  HH-3 OT      [place].   Status of service:  Completed, signed off Medicare Important Message given?   (If response is "NO", the following Medicare IM given date fields will be blank) Date Medicare IM given:   Medicare IM given by:   Date Additional Medicare IM given:   Additional Medicare IM given by:    Discharge Disposition:  [place]  Per UR Regulation:    If discussed at Long Length of Stay Meetings, dates discussed:    Comments:  Chananya Canizalez RN, BSN, MSHL, CCM  Nurse - Case Manager, (Unit [phone]  11/29/2013 Hx/o IP REHAB d/c on 11/30/2014. Social:  From home.  DTR works. Transportation:  patient on lifting restrictions and states unable to manage transportation home, self managing walker as well as carring all personal belongs. CM discussed needs with [employer] and made SW REFERRAL to SW/[facility]. Dispostiion Plan:  Home with HHS:  RN, PT, OT  ([facility]/Donna notifed of d/c today)

## 2013-11-29 NOTE — Progress Notes (Signed)
[facility]  Patient Status: New  [facility] is providing the following services: RN, PT and OT - referral from the Inpatient Rehab unit  If patient discharges after hours, please call [phone].   Pearletha Forge 11/29/2013, 4:48 PM

## 2013-11-29 NOTE — Progress Notes (Signed)
Site area:rt groin  Site Prior to Removal:  Level 0 Pressure Applied For 15 MINUTES    Minutes Beginning a 1115 Manual:   yesPatient Status During Pull:  stable Post Pull Groin Site:  Level 0 Post Pull Instructions Given:  yes  Post Pull Pulses Present: yes  Dressing Applied: yes  Bedrest Begins:1130 Comments: 3 rt femoral venous sheaths removed w/o complications

## 2013-11-29 NOTE — Discharge Summary (Signed)
ELECTROPHYSIOLOGY PROCEDURE DISCHARGE SUMMARY    Patient ID: [name] [name],  MRN: [id number], DOB/AGE: 08-04-1945 68 y.o.  Admit date: 11/29/2013 Discharge date: 11/29/2013  Primary Care Physician: Webb Silversmith, NP Primary Cardiologist: [place] Electrophysiologist: Lovena Le  Primary Discharge Diagnosis:  1.  AVNRT status post ablation this admission  Secondary Discharge Diagnosis:  1.  Diastolic dysfunction 2.  Osteoarthritis 3.  Hypertension 4.  Aortic stenosis 5.  Morbid obesity  Allergies  Allergen Reactions  . Oxycodone Hcl Nausea And Vomiting  . Penicillins Itching     Procedures This Admission:  1.  Electrophysiology study and radiofrequency catheter ablation on 11-29-13 by Dr Lovena Le.  This study demonstrated AVNRT that was successfully terminated.  Following ablation there were no inducible arrhythmias and no early apparent complications.   Brief HPI: [name] [name] is a 68 y.o. female with a past medical history as outlined above. She was admitted on 11-17-13 with sepsis and had recurrent narrow complex tachycardia.  This was treated with beta blockers which resulted in heart block.  Her tachycardia then improved and she was admitted to inpatient rehab for deconditioning.  While on rehab, she had recurrent SVT and EP was asked to evaluate for treatment options.   Risks, benefits, and alternatives to ablation were reviewed with the patient who wished to proceed.   Hospital Course:  The patient was discharged from rehab and transferred to the EP lab where she underwent EPS and RFCA of AVNRT by Dr Lovena Le on 11-29-13.  She was monitored on telemetry overnight which demonstrated sinus bradycardia.  Her groin and neck incisions were without complication.  She was evaluated by Dr Lovena Le who considered her stable for discharge to home.  She will follow up with Dr Lovena Le in the office in 4 weeks.   Discharge Vitals: Blood pressure 126/65, pulse 95, temperature 98 F (36.7  C), temperature source Oral, resp. rate 18, SpO2 95.00%.   Labs:   Lab Results  Component Value Date   WBC 5.9 11/24/2013   HGB 9.3* 11/24/2013   HCT 29.6* 11/24/2013   MCV 80.4 11/24/2013   PLT 457* 11/24/2013    Recent Labs Lab 11/24/13 0500  11/29/13 0750  NA 139  < > 140  K 5.0  < > 4.2  CL 105  < > 98  CO2 24  < > 24  BUN 16  < > 18  CREATININE 1.63*  < > 1.31*  CALCIUM 9.0  < > 9.6  PROT 7.6  --   --   BILITOT 0.3  --   --   ALKPHOS 58  --   --   ALT 30  --   --   AST 33  --   --   GLUCOSE 98  < > 123*  < > = values in this interval not displayed.   Discharge Medications:    Medication List    ASK your doctor about these medications       acetaminophen 325 MG tablet  Commonly known as:  TYLENOL  Take 2 tablets (650 mg total) by mouth every 6 (six) hours as needed for mild pain (or Fever >/= 101).     aspirin EC 81 MG tablet  Take 81 mg by mouth daily with breakfast.     bisacodyl 10 MG suppository  Commonly known as:  DULCOLAX  Place 1 suppository (10 mg total) rectally daily as needed for moderate constipation.     capsicum oleoresin 0.025 % cream  Commonly  known as:  TRIXAICIN  Apply topically 3 (three) times daily as needed (muscle pain).     furosemide 40 MG tablet  Commonly known as:  LASIX  Take 40 mg by mouth daily.     heparin 5000 UNIT/ML injection  Inject 1 mL (5,000 Units total) into the skin every 8 (eight) hours.     HYDROcodone-acetaminophen 5-325 MG per tablet  Commonly known as:  NORCO/VICODIN  Take 1-2 tablets by mouth every 4 (four) hours as needed for moderate pain.     insulin aspart 100 UNIT/ML injection  Commonly known as:  novoLOG  Inject 0-15 Units into the skin 3 (three) times daily with meals.     losartan 100 MG tablet  Commonly known as:  COZAAR  Take 100 mg by mouth daily.     magnesium hydroxide 400 MG/5ML suspension  Commonly known as:  MILK OF MAGNESIA  Take 30 mLs by mouth daily as needed for mild  constipation.     metFORMIN 500 MG tablet  Commonly known as:  GLUCOPHAGE  Take 500 mg by mouth 2 (two) times daily.     methocarbamol 500 MG tablet  Commonly known as:  ROBAXIN  Take 1 tablet (500 mg total) by mouth every 6 (six) hours as needed for muscle spasms.     metoprolol tartrate 25 MG tablet  Commonly known as:  LOPRESSOR  Take 1 tablet (25 mg total) by mouth 2 (two) times daily.     omeprazole 20 MG capsule  Commonly known as:  PRILOSEC  Take 20 mg by mouth 2 (two) times daily before a meal.     ondansetron 4 MG tablet  Commonly known as:  ZOFRAN  Take 1 tablet (4 mg total) by mouth every 6 (six) hours as needed for nausea.     senna-docusate 8.6-50 MG per tablet  Commonly known as:  Senokot-S  Take 2 tablets by mouth at bedtime.     traMADol 50 MG tablet  Commonly known as:  ULTRAM  Take by mouth every 6 (six) hours as needed.        Disposition:     Duration of Discharge Encounter: Greater than 30 minutes including physician time.  Signed, Mikle Bosworth.D.

## 2013-11-29 NOTE — H&P (View-Only) (Signed)
Patient ID: [name] [name], female   DOB: 1946/03/07, 68 y.o.   MRN: [id number]   Patient Name: [name] [name] Date of Encounter: 11/28/2013     Principal Problem:   Physical deconditioning Active Problems:   Morbid obesity   Venous stasis of lower extremity   SVT (supraventricular tachycardia)   General weakness   Multilevel spine pain   Acute kidney injury   Paroxysmal SVT (supraventricular tachycardia)    SUBJECTIVE  Denies palpitations, chest pain or sob.  CURRENT MEDS . ammonium lactate   Topical BID  . aspirin EC  81 mg Oral Q breakfast  . enoxaparin (LOVENOX) injection  75 mg Subcutaneous Q24H  . furosemide  20 mg Oral BID  . hydrocerin   Topical BID  . insulin aspart  0-15 Units Subcutaneous TID WC  . metoprolol tartrate  25 mg Oral BID  . MUSCLE RUB   Topical TID AC  . pantoprazole  40 mg Oral Daily  . senna-docusate  2 tablet Oral QHS    OBJECTIVE  Filed Vitals:   11/28/13 0850 11/28/13 0909 11/28/13 1447 11/28/13 2134  BP: 114/62  108/57 132/56  Pulse: 82 95 61 64  Temp:   98.6 F (37 C) 98 F (36.7 C)  TempSrc:   Oral Oral  Resp:   18 17  Height:      Weight:      SpO2: 99%  100% 98%    Intake/Output Summary (Last 24 hours) at 11/28/13 2149 Last data filed at 11/28/13 1800  Gross per 24 hour  Intake   1220 ml  Output      0 ml  Net   1220 ml   Filed Weights   11/25/13 0634 11/27/13 0443 11/28/13 0500  Weight: 331 lb 8 oz (150.367 kg) 327 lb 12.8 oz (148.689 kg) 328 lb 4.8 oz (148.916 kg)    PHYSICAL EXAM  General: Pleasant, NAD. Neuro: Alert and oriented X 3. Moves all extremities spontaneously. Psych: Normal affect. HEENT:  Normal  Neck: Supple without bruits or JVD. Lungs:  Resp regular and unlabored, CTA. Heart: RRR no s3, s4, or murmurs. Abdomen: Soft, non-tender, non-distended, BS + x 4.  Extremities: No clubbing, chronic woody edema, 3+, DP/PT/Radials 2+ and equal bilaterally.  Accessory Clinical Findings  CBC No  results found for this basename: WBC, NEUTROABS, HGB, HCT, MCV, PLT,  in the last 72 hours Basic Metabolic Panel  Recent Labs  11/27/13 0711 11/28/13 0640  NA 141 139  K 4.2 4.0  CL 103 100  CO2 25 25  GLUCOSE 98 100*  BUN 18 19  CREATININE 1.47* 1.43*  CALCIUM 9.1 9.1   Liver Function Tests No results found for this basename: AST, ALT, ALKPHOS, BILITOT, PROT, ALBUMIN,  in the last 72 hours No results found for this basename: LIPASE, AMYLASE,  in the last 72 hours Cardiac Enzymes No results found for this basename: CKTOTAL, CKMB, CKMBINDEX, TROPONINI,  in the last 72 hours BNP No components found with this basename: POCBNP,  D-Dimer No results found for this basename: DDIMER,  in the last 72 hours Hemoglobin A1C No results found for this basename: HGBA1C,  in the last 72 hours Fasting Lipid Panel No results found for this basename: CHOL, HDL, LDLCALC, TRIG, CHOLHDL, LDLDIRECT,  in the last 72 hours Thyroid Function Tests No results found for this basename: TSH, T4TOTAL, FREET3, T3FREE, THYROIDAB,  in the last 72 hours    ECG  NSR  Radiology/Studies  Dg Chest 2  View  11/17/2013   CLINICAL DATA:  Difficulty breathing  EXAM: CHEST  2 VIEW  COMPARISON:  August 02, 2013 chest radiograph and chest CT  FINDINGS: There is no edema or consolidation. The heart size and pulmonary vascularity are normal. No adenopathy. There is degenerative change in the thoracic spine.  IMPRESSION: No edema or consolidation.   Electronically Signed   By: Lowella Grip M.D.   On: 11/17/2013 11:27   Dg Lumbar Spine Complete  11/17/2013   CLINICAL DATA:  Weakness, CVA rib back pain  EXAM: LUMBAR SPINE - COMPLETE 4+ VIEW  COMPARISON:  None.  FINDINGS: Five views of lumbar spine submitted. There is disc space flattening with anterior spurring at L4-L5 and L5-S1 level. Narrowing of neural foramina noted bilaterally at L5-S1 level. Facet degenerative changes L3-L4 and L5 level. Anterior spurring upper  endplate of L4 vertebral body. Degenerative changes lower thoracic spine. No acute fracture or subluxation.  IMPRESSION: No acute fracture or subluxation. Degenerative changes as described above.   Electronically Signed   By: Lahoma Crocker M.D.   On: 11/17/2013 11:38   Dg Pelvis 1-2 Views  11/17/2013   CLINICAL DATA:  Fall.  Pelvic pain  EXAM: PELVIS - 1-2 VIEW  COMPARISON:  None.  FINDINGS: There is no evidence of pelvic fracture or diastasis. No other pelvic bone lesions are seen. Mild degenerative changes seen involving the pubic symphysis and hip joints.  IMPRESSION: No acute findings.   Electronically Signed   By: Earle Gell M.D.   On: 11/17/2013 11:35    ASSESSMENT AND PLAN  1. Recurrent SVT I have discussed the treatment options with the patient and recommended proceeding with catheter ablation. This has been tentatively scheduled to be carried out tomorrow.  [name] [name],M.D.  11/28/2013 9:49 PM

## 2013-11-29 NOTE — Patient Care Conference (Signed)
Inpatient RehabilitationTeam Conference and Plan of Care Update Date: 11/29/2013   Time: 10;30 AM    Patient Name: [name] [name]      Medical Record Number: [id number]  Date of Birth: 10/22/1945 Sex: Female         Room/Bed: 4W24C/4W24C-01 Payor Info: Payor: [place] / Plan: BLUE MEDICARE / Product Type: *No Product type* /    Admitting Diagnosis: deconditioned sepsis svt  Admit Date/Time:  11/23/2013  1:44 PM Admission Comments: No comment available   Primary Diagnosis:  Physical deconditioning Principal Problem: Physical deconditioning  Patient Active Problem List   Diagnosis Date Noted  . Paroxysmal SVT (supraventricular tachycardia) 11/28/2013  . Physical deconditioning 11/23/2013  . Acute kidney injury   . Complete heart block 11/19/2013  . Febrile illness, acute 11/17/2013  . General weakness 11/17/2013  . Unable to walk 11/17/2013  . Dizziness 11/17/2013  . Nausea with vomiting 11/17/2013  . SOBOE (shortness of breath on exertion) 11/17/2013  . Multilevel spine pain 11/17/2013  . Abdominal pain, unspecified site 11/17/2013  . History of recent fall 11/17/2013  . Sputum production 11/17/2013  . Aortic stenosis, mild 11/17/2013  . UTI (lower urinary tract infection) 11/17/2013  . Sepsis 11/17/2013  . Aortic stenosis 11/17/2013  . Cellulitis of leg, left 11/17/2013  . DM type 2 (diabetes mellitus, type 2) 02/02/2013  . Hypokalemia 10/19/2012  . Arthritis 10/19/2012  . Mixed incontinence 10/19/2012  . SVT (supraventricular tachycardia) 07/28/2011  . GERD (gastroesophageal reflux disease) 04/20/2011  . Venous stasis of lower extremity 01/20/2011  . HTN (hypertension) 01/20/2011  . Morbid obesity     Expected Discharge Date: Expected Discharge Date: 11/29/13  Team Members Present: Physician leading conference: Dr. Alysia Penna Social Worker Present: Ovidio Kin, LCSW Nurse Present: Elliot Cousin, RN PT Present: Cameron Sprang,  PT;Loel Betancur Jari Favre, PT OT Present: Simonne Come, Rhetta Mura, OT SLP Present: Gunnar Fusi, SLP PPS Coordinator present : Ileana Ladd, Lelan Pons, RN, CRRN     Current Status/Progress Goal Weekly Team Focus  Medical     cardiac ablation this am-transferred to acute-dc from there   medical stability     Bowel/Bladder     cont B & B   needing to go more due to lasix     Swallow/Nutrition/ Hydration     wfl        ADL's     supervision/min level   min LE-B&D and mod/i UE   activity tolerance, ADL training, conservation of energy  Mobility   Supervision-mod I for functional mobility   overall mod I with supervision for car transfer and stairs  activity tolerance, functional mobility training, discharge planning   Communication     wfl        Safety/Cognition/ Behavioral Observations    no unsafe behaviors        Pain     managed pain-no pain issues        Skin     monitor skin no issues presently           *See Care Plan and progress notes for long and short-term goals.  Barriers to Discharge:   medical stability-cardiac    Possible Resolutions to Barriers:    medical clearance   Discharge Planning/Teaching Needs:  HOme with her daughter once medically cleared-dc to acute for medical procedure. HOme once medically cleared-reached rehab goals      Team Discussion:  Met rehab goals of mod/i-supervision level.  Needed ablation  prior to discharge home.  Will go home from acute  Revisions to Treatment Plan:  None      Camil Hausmann, Gardiner Rhyme 11/29/2013, 9:05 AM

## 2013-11-29 NOTE — Interval H&P Note (Signed)
History and Physical Interval Note: Since my visit yesterday, no change in the H&P. She will be discharged from rehab this morning and proceed with EP study and catheter ablation.  11/29/2013 7:19 AM  [name] [name]  has presented today for surgery, with the diagnosis of svt  The various methods of treatment have been discussed with the patient and family. After consideration of risks, benefits and other options for treatment, the patient has consented to  Procedure(s): SUPRAVENTRICULAR TACHYCARDIA ABLATION (N/A) as a surgical intervention .  The patient's history has been reviewed, patient examined, no change in status, stable for surgery.  I have reviewed the patient's chart and labs.  Questions were answered to the patient's satisfaction.     Mikle Bosworth.D.

## 2013-11-30 DIAGNOSIS — I359 Nonrheumatic aortic valve disorder, unspecified: Secondary | ICD-10-CM | POA: Diagnosis not present

## 2013-11-30 DIAGNOSIS — I10 Essential (primary) hypertension: Secondary | ICD-10-CM | POA: Diagnosis not present

## 2013-11-30 DIAGNOSIS — I498 Other specified cardiac arrhythmias: Secondary | ICD-10-CM | POA: Diagnosis not present

## 2013-11-30 DIAGNOSIS — I471 Supraventricular tachycardia: Secondary | ICD-10-CM | POA: Diagnosis not present

## 2013-11-30 LAB — GLUCOSE, CAPILLARY: Glucose-Capillary: 103 mg/dL — ABNORMAL HIGH (ref 70–99)

## 2013-11-30 NOTE — Discharge Summary (Signed)
Physician Discharge Summary  Patient ID: [name] [name] MRN: [id number] DOB/AGE: 1945/11/25 68 y.o.  Admit date: 11/23/2013 Discharge date: 11/29/2013  Discharge Diagnoses:  Principal Problem:   Physical deconditioning Active Problems:   Morbid obesity   Venous stasis of lower extremity   SVT (supraventricular tachycardia)   General weakness   Multilevel spine pain   Acute kidney injury   Paroxysmal SVT (supraventricular tachycardia)   Discharged Condition: Stable    Labs:  Basic Metabolic Panel:  Recent Labs Lab 11/24/13 0500 11/25/13 0730 11/26/13 0615 11/26/13 1518 11/27/13 0711 11/28/13 0640 11/29/13 0750  NA 139 143 139 137 141 139 140  K 5.0 4.6 4.2 4.0 4.2 4.0 4.2  CL 105 105 102 99 103 100 98  CO2 24 24 24 24 25 25 24   GLUCOSE 98 97 109* 138* 98 100* 123*  BUN 16 15 19 18 18 19 18   CREATININE 1.63* 1.58* 1.57* 1.62* 1.47* 1.43* 1.31*  CALCIUM 9.0 9.2 9.4 9.3 9.1 9.1 9.6    CBC:  Recent Labs Lab 11/24/13 0500  WBC 5.9  NEUTROABS 3.5  HGB 9.3*  HCT 29.6*  MCV 80.4  PLT 457*    CBG:  Recent Labs Lab 11/29/13 1116 11/29/13 1249 11/29/13 1810 11/29/13 2041 11/30/13 0747  GLUCAP 99 117* 147* 136* 103*    Brief HPI:   [name] [name] is a 68 y.o. female with history of CHF, morbid obesity, SVT, PAC, PVS's, mild AS who was admitted on 11/17/13 with SOB, weeping edema BLE, fatigue as well as nausea and vomiting. She was treated for sepsis as work up with leucocytosis due to E coli UTI as well as BLE cellulitis. 2D echo with EF 60-655 with grade 2 diastolic dysfunction. Pro BNP-1875 and CXR without edema. She was treated with IV diuretics but has had worsening of renal status. She developed 2nd degree heart block and metoprolol d/c. Dr. Radford Pax was consulted for input and recommended restarting Lorsartan as well as cardiac monitoring till BB washed out. Cardiac enzymes X 3 negative. Heart block has resolved with recurrent tachycardia therefore low  dose metoprolol was resumed. Serum Cr has been on steady rise to 1.80 therefore lasix was discontinued.  PT evaluation done and CIR recommended due to deconditioned state.   Hospital Course: [name] was admitted to rehab 11/23/2013 for inpatient therapies to consist of PT, ST and OT at least three hours five days a week. Past admission physiatrist, therapy team and rehab RN have worked together to provide customized collaborative inpatient rehab. Blood pressures have been reasonably controlled and edema BLE has been stable off diuretics. Renal status has been monitored and is showing improvement. Follow up UCS showed  no growth. Lac hydrin was added to help treat Ichthyosis BLE. She has had recurrent SVT and catheter ablation was recommended for reduction of risk of recurrences. Patient has progressed to independence level and was discharge to cardiology services on 11/28/13 for cathter ablation. [facility] to follow up for transition to home once discharged from acute services.    Rehab course: During patient's stay in rehab weekly team conferences were held to monitor patient's progress, set goals and discuss barriers to discharge. Patient has had improvement in activity tolerance, balance, postural control, as well as ability to compensate for deficits. Patient was independent for ADL tasks as well as all aspects of mobility.   Disposition: 01-Home or Self Care  Diet: Heart Healthy     Medication List    ASK your doctor  about these medications       acetaminophen 325 MG tablet  Commonly known as:  TYLENOL  Take 2 tablets (650 mg total) by mouth every 6 (six) hours as needed for mild pain (or Fever >/= 101).     aspirin EC 81 MG tablet  Take 81 mg by mouth daily with breakfast.     bisacodyl 10 MG suppository  Commonly known as:  DULCOLAX  Place 1 suppository (10 mg total) rectally daily as needed for moderate constipation.     capsicum oleoresin 0.025 % cream  Commonly  known as:  TRIXAICIN  Apply topically 3 (three) times daily as needed (muscle pain).     heparin 5000 UNIT/ML injection  Inject 1 mL (5,000 Units total) into the skin every 8 (eight) hours.     HYDROcodone-acetaminophen 5-325 MG per tablet  Commonly known as:  NORCO/VICODIN  Take 1-2 tablets by mouth every 4 (four) hours as needed for moderate pain.     insulin aspart 100 UNIT/ML injection  Commonly known as:  novoLOG  Inject 0-15 Units into the skin 3 (three) times daily with meals.     magnesium hydroxide 400 MG/5ML suspension  Commonly known as:  MILK OF MAGNESIA  Take 30 mLs by mouth daily as needed for mild constipation.     methocarbamol 500 MG tablet  Commonly known as:  ROBAXIN  Take 1 tablet (500 mg total) by mouth every 6 (six) hours as needed for muscle spasms.     metoprolol tartrate 25 MG tablet  Commonly known as:  LOPRESSOR  Take 1 tablet (25 mg total) by mouth 2 (two) times daily.     omeprazole 20 MG capsule  Commonly known as:  PRILOSEC  Take 20 mg by mouth 2 (two) times daily before a meal.     ondansetron 4 MG tablet  Commonly known as:  ZOFRAN  Take 1 tablet (4 mg total) by mouth every 6 (six) hours as needed for nausea.     senna-docusate 8.6-50 MG per tablet  Commonly known as:  Senokot-S  Take 2 tablets by mouth at bedtime.     traMADol 50 MG tablet  Commonly known as:  ULTRAM  Take by mouth every 6 (six) hours as needed.           Follow-up Information   Call Charlett Blake, MD. (As needed)    Specialty:  Physical Medicine and Rehabilitation   Contact information:   [place] [address] [phone]       Call Virl Axe, MD. (for follow up)    Specialty:  Cardiology   Contact information:   [address] [address] [phone]       Follow up with [facility] On 12/07/2013. (APPT @ 2;30 PM)    Contact information:   [place]  [place] [address]       Signed: Bary Leriche 11/30/2013, 1:03 PM

## 2013-11-30 NOTE — Discharge Instructions (Signed)
No driving for 24 hours. No lifting over 5 lbs for 1 week. No sexual activity for 1 week. Keep procedure site clean & dry. If you notice increased pain, swelling, bleeding or pus, call/return!  You may shower, but no soaking baths/hot tubs/pools for 1 week.  ° ° °

## 2013-11-30 NOTE — Progress Notes (Signed)
Chaplain responded to spiritual care consult. Patient said she was preparing to be discharged but she would appreciate prayer. Patient identifies as [name] [name] and has a church home where she is very active. Her primary concerns are adjusting to using a walker in her daughter's apartment that is not handicap accessible. She does not have transportation, and said she often takes a cab. Her church has a Lucianne Lei that picks her up for worship services. She described a strong support system of family and faith community. Patient requested prayer for peace of mind. Chaplain listened empathically to her concerns, provided emotional support, prayer, and pastoral presence. She appreciated visit.   Chaplain discussed with care management possibilities for assisting patient with finding suitable transportation home and assistance carrying her belongings inside.  Ethelene Browns [phone]

## 2013-11-30 NOTE — Discharge Summary (Signed)
Discharge Summary   Patient ID: [name] [name],  MRN: [id number], DOB/AGE: [birth year] 67 y.o.  Admit date: 11/29/2013 Discharge date: 11/30/2013  Primary Care Provider: Vertell Novak Primary Cardiologist: Dr. Martinique Electrophysiologist: Dr Lovena Le  Discharge Diagnoses Active Problems:   Paroxysmal supraventricular tachycardia   Allergies Allergies  Allergen Reactions  . Oxycodone Hcl Nausea And Vomiting  . Penicillins Itching    Procedures  Echocardiogram 11/17/2013 LV EF: 60% - 65%  ------------------------------------------------------------------- Indications: Aortic stenosis 424.1.  ------------------------------------------------------------------- History: PMH: Morbid obesity. Sepsis. Left leg cellulitis. General weakness. Abdominal pain. Multilevel spine pain. Risk factors: Hypertension. Diabetes mellitus.  ------------------------------------------------------------------- Study Conclusions  - Left ventricle: The cavity size was normal. There was mild concentric hypertrophy. Systolic function was normal. The estimated ejection fraction was in the range of 60% to 65%. Wall motion was normal; there were no regional wall motion abnormalities. Features are consistent with a pseudonormal left ventricular filling pattern, with concomitant abnormal relaxation and increased filling pressure (grade 2 diastolic dysfunction). - Aortic valve: There was very mild stenosis. Valve area (VTI): 1.83 cm^2. Valve area (Vmax): 1.87 cm^2. Valve area (Vmean): 2 cm^2. - Left atrium: The atrium was mildly dilated.     Cardiac catheterization Preoperative diagnoses: Incessant SVT refractory to medical therapy  Postoperative diagnoses: Same as preoperative diagnoses  Results.  1. Baseline ECG. The baseline ECG demonstrated normal sinus rhythm with no ventricular preexcitation.  2. Baseline intervals. The sinus node cycle length was 700 ms. The QRS duration was 110 ms. The  AH interval was 103 ms. The HV interval was 40 ms.  3. Rapid ventricular pacing. Rapid ventricular pacing was carried out from the right ventricle demonstrating VA block at 300 ms. During rapid ventricular pacing, the atrial activation was midline and decremental. During rapid ventricular pacing, there was inducible SVT.  4. Programmed ventricular stimulation. Programmed ventricular stimulation was carried out from the right ventricle at a pacing cycle length of 600 ms. The S1-S2 interval was stepwise decrease down to 280 ms her ventricular refractoriness was observed. During programmed ventricular stimulation, the atrial activation was midline and decremental. During programmed ventricular stimulation, there was inducible SVT.  5. Programmed atrial stimulation. Programmed atrial stimulation was carried out from a cycle length of 600 ms. S1-S2 interval was stepwise decrease down to 270 ms. During programmed atrial stimulation, there are multiple AH jumps, echo beats, and inducible SVT. Following catheter ablation, there was residual AH jumps. There is also residual echo beats. There is no inducible SVT.  6. Rapid atrial pacing. Rapid atrial pacing was carried out from the atrium at a pacing cycle length of 600 ms and stepwise decrease down to 360 ms were A-V block was demonstrated. Following ablation, A-V block was demonstrated at 480 ms. During rapid atrial pacing prior to ablation, there is easily inducible SVT.  7. Arrhythmias observed. AV node reentrant tachycardia. Initiation was with rapid atrial pacing, programmed atrial stimulation, rapid ventricular pacing, and programmed ventricular stimulation. Cycle length was 490 ms. Method of termination was with rapid ventricular pacing  8. Mapping. Mapping of the patient's SVT demonstrated a normal size and orientation of Koch's triangle.  9. Radiofrequency energy application. 2 radiofrequency energy applications were delivered. During radiofrequency  application, there is accelerated junctional rhythm.  Conclusion: Successful catheter ablation of AV node reentrant tachycardia with 2 radiofrequency energy applications. Following ablation, there was no inducible SVT.     Hospital Course  The patient is a 68 year old female with past medical history of palpation, diastolic dysfunction, OA,  PACs and PVCs, morbid obesity, supraventricular ventricular tachycardia, aortic stenosis, congestive heart failure who had recurrent supraventricular tachycardia. She was admitted on 11/17/2013 with sepsis and recurrent narrow complex tachycardia. This was initially treated with beta blocker which resulted in heart block. Patient was subsequently admitted to the inpatient rehabilitation for deconditioning. While at the rehabilitation, patient had recurrent SVT and EP team was consulted to evaluate for possible treatment options. She was seen by Dr Lovena Le on 11/28/2013. Different treatment options were discussed with the patient and catheter ablation was recommended for the patient's recurrent SVT. Patient underwent a scheduled outpatient procedure on 11/29/2013 and had successful catheter ablation of AV node reentrant tachycardia with 2 radiofrequency energy application. Post ablation, there was no inducible SVT.   Patient was seen the morning of 11/30/2013 by Dr. Lovena Le, telemetry overnight demonstrated sinus bradycardia, cath site was stable. She is deemed stable for discharge from cardiology perspective.She will followup with Dr. Lovena Le in the office in 4 weeks. I have restarted her lasix on discharge given history of diastolic heart failure. We have also set up home health for her PT/OT and nursing need upon discharge.   Discharge Vitals Blood pressure 98/59, pulse 73, temperature 98 F (36.7 C), temperature source Oral, resp. rate 18, weight 328 lb 0.7 oz (148.8 kg), SpO2 95.00%.  Filed Weights   11/30/13 0402  Weight: 328 lb 0.7 oz (148.8 kg)     Labs  CBC No results found for this basename: WBC, NEUTROABS, HGB, HCT, MCV, PLT,  in the last 72 hours Basic Metabolic Panel  Recent Labs  11/28/13 0640 11/29/13 0750  NA 139 140  K 4.0 4.2  CL 100 98  CO2 25 24  GLUCOSE 100* 123*  BUN 19 18  CREATININE 1.43* 1.31*  CALCIUM 9.1 9.6    Disposition  Pt is being discharged home today in good condition.  Follow-up Plans & Appointments      Follow-up Information   Follow up with Cristopher Peru, MD On 01/03/2014. (11:30am)    Specialty:  Cardiology   Contact information:   [address] [address] [phone]       Follow up with [facility]. Occupational hygienist, Physical Therapy and Occupational Services to start within 24-48 hours of discharge)    Contact information:   [address] [place] [address] [phone]       Discharge Medications    Medication List    STOP taking these medications       heparin 5000 UNIT/ML injection     insulin aspart 100 UNIT/ML injection  Commonly known as:  novoLOG     losartan 100 MG tablet  Commonly known as:  COZAAR      TAKE these medications       acetaminophen 325 MG tablet  Commonly known as:  TYLENOL  Take 2 tablets (650 mg total) by mouth every 6 (six) hours as needed for mild pain (or Fever >/= 101).     aspirin EC 81 MG tablet  Take 81 mg by mouth daily with breakfast.     bisacodyl 10 MG suppository  Commonly known as:  DULCOLAX  Place 1 suppository (10 mg total) rectally daily as needed for moderate constipation.     capsicum oleoresin 0.025 % cream  Commonly known as:  TRIXAICIN  Apply topically 3 (three) times daily as needed (muscle pain).     furosemide 40 MG tablet  Commonly known as:  LASIX  Take 40 mg by mouth daily.     HYDROcodone-acetaminophen 5-325 MG per tablet  Commonly known as:  NORCO/VICODIN  Take 1-2 tablets by mouth every 4 (four) hours as needed for moderate pain.      magnesium hydroxide 400 MG/5ML suspension  Commonly known as:  MILK OF MAGNESIA  Take 30 mLs by mouth daily as needed for mild constipation.     metFORMIN 500 MG tablet  Commonly known as:  GLUCOPHAGE  Take 500 mg by mouth 2 (two) times daily.     methocarbamol 500 MG tablet  Commonly known as:  ROBAXIN  Take 1 tablet (500 mg total) by mouth every 6 (six) hours as needed for muscle spasms.     metoprolol tartrate 25 MG tablet  Commonly known as:  LOPRESSOR  Take 1 tablet (25 mg total) by mouth 2 (two) times daily.     omeprazole 20 MG capsule  Commonly known as:  PRILOSEC  Take 20 mg by mouth 2 (two) times daily before a meal.     ondansetron 4 MG tablet  Commonly known as:  ZOFRAN  Take 1 tablet (4 mg total) by mouth every 6 (six) hours as needed for nausea.     senna-docusate 8.6-50 MG per tablet  Commonly known as:  Senokot-S  Take 2 tablets by mouth at bedtime.     traMADol 50 MG tablet  Commonly known as:  ULTRAM  Take by mouth every 6 (six) hours as needed.         Duration of Discharge Encounter   Greater than 30 minutes including physician time.  Hilbert Corrigan PA-C Pager: [phone] 11/30/2013, 11:34 AM  EP Attending  Patient seen and examined. Agree with above.  Mikle Bosworth.D.

## 2013-11-30 NOTE — Consult Note (Signed)
HPI: 68 year old female who has a past medical history of Edema; Palpitations; History of diastolic dysfunction; Arthritis; OA (osteoarthritis); PAC (premature atrial contraction); PVC's (premature ventricular contractions); Morbid obesity; HTN (hypertension); Tachycardia; SVT (supraventricular tachycardia); Aortic stenosis, mild (11/17/2013); and CHF (congestive heart failure)who presented to the hospital with worsening dyspnea on exertion, and fatigue. Patient says that she gets short of breath while walking short distances, unable to climb 1 flight of stairs. She also complains of nausea and vomiting, no fever. Denies chest pain, no syncope. No history of seizures or stroke in the past. Patient does have a history of aortic stenosis, last echocardiogram 11/17/13 showed normal LVF, diastolic dysfunction with very mild AS. In the ED patient was found to have abnormal UA and leukocytosis c/w sepsis from UTI as well as cellulitis.. Started on IV Rocephin and then changed to Vanc and Levaquin. Chest x-ray did not show any significant abnormality. BNP mildly elevated at 1875. She was started on lasix. She was noted on tele to have some second degree AV block and her metoprolol was stopped. She then had recurrent, and nearly incessant SVT off of beta blockers and is referred to undergo catheter ablation. PMH:  Past Medical History   Diagnosis  Date   .  Edema    .  Palpitations    .  History of diastolic dysfunction      Echo 06/3293 with diastolic dysfunction   .  Arthritis    .  OA (osteoarthritis)    .  PAC (premature atrial contraction)      per prior Holter   .  PVC's (premature ventricular contractions)      per prior Holter   .  Morbid obesity    .  HTN (hypertension)    .  Tachycardia      noted at 07/28/11 visit. Started on beta blocker. Possible atrial flutter vs long PR tachycardia/AVNRT   .  SVT (supraventricular tachycardia)    .  Aortic stenosis, mild  11/17/2013   .  CHF (congestive heart  failure)     PSH:  Past Surgical History   Procedure  Laterality  Date   .  Cesarean section     .  US echocardiography   07/25/2008     EF 55-60%    Allergies: Oxycodone hcl and Penicillins  Prior to Admit Meds:  Prescriptions prior to admission   Medication  Sig  Dispense  Refill   .  acetaminophen (TYLENOL) 325 MG tablet  Take 325-650 mg by mouth every 6 (six) hours as needed for moderate pain.     Marland Kitchen  aspirin EC 81 MG tablet  Take 81 mg by mouth daily with breakfast.     .  furosemide (LASIX) 40 MG tablet  Take 40 mg by mouth daily as needed for edema.     Marland Kitchen  losartan (COZAAR) 100 MG tablet  Take 100 mg by mouth daily.     .  metFORMIN (GLUCOPHAGE) 500 MG tablet  Take 500 mg by mouth 2 (two) times daily with a meal.     .  metoprolol (LOPRESSOR) 100 MG tablet  Take 100 mg by mouth 2 (two) times daily.     Marland Kitchen  omeprazole (PRILOSEC) 20 MG capsule  Take 20 mg by mouth 2 (two) times daily before a meal.     .  traMADol (ULTRAM) 50 MG tablet  Take by mouth every 6 (six) hours as needed.      Fam HX:  Family History   Problem  Relation  Age of Onset   .  Alzheimer's disease  Mother    .  Lung cancer  Mother    .  COPD  Father    .  Diabetes  Maternal Uncle    .  Stroke  Neg Hx     Social HX:  History    Social History   .  Marital Status:  Single     Spouse Name:  N/A     Number of Children:  2   .  Years of Education:  N/A    Occupational History   .  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  Smoking status:  Former Smoker     Types:  Cigarettes     Quit date:  11/23/1985   .  Smokeless tobacco:  Never Used   .  Alcohol Use:  No   .  Drug Use:  No   .  Sexual Activity:  No    Other Topics  Concern   .  Not on file    Social History Narrative   .  No narrative on file    ROS: All 11 ROS were addressed and are negative except what is stated in the HPI  Physical Exam:  Blood pressure 127/70, pulse 72, temperature 98.8 F (37.1 C),  temperature source Oral, resp. rate 16, height 5\' 11"  (1.803 m), weight 338 lb 6.5 oz (153.5 kg), SpO2 96.00%.  General: Well developed, well nourished, in no acute distress  Head: Eyes PERRLA, No xanthomas. Normal cephalic and atramatic  Lungs: Clear bilaterally to auscultation and percussion.  Heart: HRRR S1 S2 Pulses are 2+ & equal.  No carotid bruit. No JVD. No abdominal bruits. No femoral bruits.  Abdomen: Bowel sounds are positive, abdomen soft and non-tender without masses  Extremities: B/L lymphadenitic changes with superimposed cellulitis  Neuro: Alert and oriented X 3.  Psych: Good affect, responds appropriately  Labs:  Lab Results   Component  Value  Date    WBC  13.9*  11/18/2013    HGB  9.4*  11/18/2013    HCT  28.7*  11/18/2013    MCV  76.3*  11/18/2013    PLT  363  11/18/2013    Recent Labs  Lab  11/18/13 0306   NA  135*   K  3.5*   CL  98   CO2  24   BUN  24*   CREATININE  1.47*   CALCIUM  8.9   PROT  7.9   BILITOT  0.7   ALKPHOS  75   ALT  34   AST  59*   GLUCOSE  117*    No results found for this basename: PTT    No results found for this basename: INR, PROTIME    Lab Results   Component  Value  Date    TROPONINI  <0.30  11/18/2013    Lab Results   Component  Value  Date    CHOL  148  05/02/2012    CHOL  165  12/31/2011    CHOL  167  01/20/2011    Lab Results   Component  Value  Date    HDL  47.70  05/02/2012    HDL  45.90  12/31/2011    HDL  55.00  01/20/2011    Lab Results   Component  Value  Date  LDLCALC  90  05/02/2012    LDLCALC  105*  12/31/2011    LDLCALC  100*  01/20/2011    Lab Results   Component  Value  Date    TRIG  51.0  05/02/2012    TRIG  71.0  12/31/2011    TRIG  62.0  01/20/2011    Lab Results   Component  Value  Date    CHOLHDL  3  05/02/2012    CHOLHDL  4  12/31/2011    CHOLHDL  3  01/20/2011    No results found for this basename: LDLDIRECT    Radiology: No results found.  EKG: NSR with episodes of complete heart  block with junctional excape  ASSESSMENT/PLAN:  1.  SVT - The patient has a class 1 indication for catheter ablation of her SVT. I have discussed the risks/benefits/goals/expectations of catheter ablation with the patient and she wishes to proceed.  Mikle Bosworth.D.

## 2013-11-30 NOTE — Clinical Social Work Note (Signed)
Patient discharged home today and was provided with a cab voucher for transport home.  Mohammed Mcandrew Givens, MSW, LCSW [phone]

## 2013-12-06 ENCOUNTER — Telehealth: Payer: Self-pay | Admitting: Internal Medicine

## 2013-12-06 NOTE — Telephone Encounter (Signed)
Pt is requesting re-fills on the following medications: methocarbamol (ROBAXIN) 500 MG tablet HYDROcodone-acetaminophen (NORCO/VICODIN) 5-325 MG per tablet

## 2013-12-07 ENCOUNTER — Other Ambulatory Visit: Payer: Self-pay | Admitting: Geriatric Medicine

## 2013-12-07 ENCOUNTER — Ambulatory Visit: Payer: Medicare Other | Admitting: Internal Medicine

## 2013-12-07 MED ORDER — HYDROCODONE-ACETAMINOPHEN 5-325 MG PO TABS: 1.0000 | ORAL_TABLET | Freq: Four times a day (QID) | ORAL | Status: DC | PRN

## 2013-12-07 MED ORDER — METHOCARBAMOL 500 MG PO TABS: 500.0000 mg | ORAL_TABLET | Freq: Four times a day (QID) | ORAL | Status: DC | PRN

## 2013-12-07 NOTE — Telephone Encounter (Signed)
Yes, I sent in robaxin. Dr. Doug Sou

## 2013-12-07 NOTE — Telephone Encounter (Signed)
Ok to refill? Please advise, thanks  

## 2013-12-09 ENCOUNTER — Encounter (HOSPITAL_COMMUNITY): Payer: Self-pay | Admitting: Emergency Medicine

## 2013-12-09 ENCOUNTER — Emergency Department (HOSPITAL_COMMUNITY): Payer: Medicare Other

## 2013-12-09 ENCOUNTER — Emergency Department (HOSPITAL_COMMUNITY)
Admission: EM | Admit: 2013-12-09 | Discharge: 2013-12-10 | Disposition: A | Discharge status: Home / Self Care | Payer: Medicare Other | Attending: Emergency Medicine | Admitting: Emergency Medicine

## 2013-12-09 DIAGNOSIS — I498 Other specified cardiac arrhythmias: Secondary | ICD-10-CM | POA: Insufficient documentation

## 2013-12-09 DIAGNOSIS — G8929 Other chronic pain: Secondary | ICD-10-CM | POA: Insufficient documentation

## 2013-12-09 DIAGNOSIS — I10 Essential (primary) hypertension: Secondary | ICD-10-CM | POA: Insufficient documentation

## 2013-12-09 DIAGNOSIS — K219 Gastro-esophageal reflux disease without esophagitis: Secondary | ICD-10-CM | POA: Insufficient documentation

## 2013-12-09 DIAGNOSIS — E119 Type 2 diabetes mellitus without complications: Secondary | ICD-10-CM | POA: Diagnosis not present

## 2013-12-09 DIAGNOSIS — Z880 Allergy status to penicillin: Secondary | ICD-10-CM | POA: Insufficient documentation

## 2013-12-09 DIAGNOSIS — Z8701 Personal history of pneumonia (recurrent): Secondary | ICD-10-CM | POA: Diagnosis not present

## 2013-12-09 DIAGNOSIS — M546 Pain in thoracic spine: Secondary | ICD-10-CM | POA: Diagnosis not present

## 2013-12-09 DIAGNOSIS — I509 Heart failure, unspecified: Secondary | ICD-10-CM | POA: Insufficient documentation

## 2013-12-09 DIAGNOSIS — M1990 Unspecified osteoarthritis, unspecified site: Secondary | ICD-10-CM | POA: Insufficient documentation

## 2013-12-09 DIAGNOSIS — M549 Dorsalgia, unspecified: Secondary | ICD-10-CM | POA: Diagnosis present

## 2013-12-09 DIAGNOSIS — Z79899 Other long term (current) drug therapy: Secondary | ICD-10-CM | POA: Insufficient documentation

## 2013-12-09 DIAGNOSIS — Z87448 Personal history of other diseases of urinary system: Secondary | ICD-10-CM | POA: Insufficient documentation

## 2013-12-09 DIAGNOSIS — M129 Arthropathy, unspecified: Secondary | ICD-10-CM | POA: Insufficient documentation

## 2013-12-09 DIAGNOSIS — R300 Dysuria: Secondary | ICD-10-CM | POA: Diagnosis not present

## 2013-12-09 DIAGNOSIS — R000 Tachycardia, unspecified: Secondary | ICD-10-CM | POA: Insufficient documentation

## 2013-12-09 DIAGNOSIS — Z7982 Long term (current) use of aspirin: Secondary | ICD-10-CM | POA: Insufficient documentation

## 2013-12-09 DIAGNOSIS — Z87891 Personal history of nicotine dependence: Secondary | ICD-10-CM | POA: Diagnosis not present

## 2013-12-09 DIAGNOSIS — R112 Nausea with vomiting, unspecified: Secondary | ICD-10-CM | POA: Diagnosis not present

## 2013-12-09 LAB — URINE MICROSCOPIC-ADD ON

## 2013-12-09 LAB — URINALYSIS, ROUTINE W REFLEX MICROSCOPIC
Glucose, UA: NEGATIVE mg/dL
Ketones, ur: 15 mg/dL — AB
Leukocytes, UA: NEGATIVE
Nitrite: NEGATIVE
Protein, ur: NEGATIVE mg/dL
Specific Gravity, Urine: 1.026 (ref 1.005–1.030)
Urobilinogen, UA: 1 mg/dL (ref 0.0–1.0)
pH: 5.5 (ref 5.0–8.0)

## 2013-12-09 LAB — COMPREHENSIVE METABOLIC PANEL
ALT: 8 U/L (ref 0–35)
AST: 12 U/L (ref 0–37)
Albumin: 2.7 g/dL — ABNORMAL LOW (ref 3.5–5.2)
Alkaline Phosphatase: 55 U/L (ref 39–117)
Anion gap: 14 (ref 5–15)
BUN: 9 mg/dL (ref 6–23)
CO2: 25 mEq/L (ref 19–32)
Calcium: 9 mg/dL (ref 8.4–10.5)
Chloride: 96 mEq/L (ref 96–112)
Creatinine, Ser: 0.93 mg/dL (ref 0.50–1.10)
GFR calc Af Amer: 72 mL/min — ABNORMAL LOW (ref >90)
GFR calc non Af Amer: 62 mL/min — ABNORMAL LOW (ref >90)
Glucose, Bld: 157 mg/dL — ABNORMAL HIGH (ref 70–99)
Potassium: 3.5 mEq/L — ABNORMAL LOW (ref 3.7–5.3)
Sodium: 135 mEq/L — ABNORMAL LOW (ref 137–147)
Total Bilirubin: 0.6 mg/dL (ref 0.3–1.2)
Total Protein: 8.3 g/dL (ref 6.0–8.3)

## 2013-12-09 LAB — CBC WITH DIFFERENTIAL/PLATELET
Basophils Absolute: 0 10*3/uL (ref 0.0–0.1)
Basophils Relative: 0 % (ref 0–1)
Eosinophils Absolute: 0 10*3/uL (ref 0.0–0.7)
Eosinophils Relative: 0 % (ref 0–5)
HCT: 28.2 % — ABNORMAL LOW (ref 36.0–46.0)
Hemoglobin: 9.2 g/dL — ABNORMAL LOW (ref 12.0–15.0)
Lymphocytes Relative: 17 % (ref 12–46)
Lymphs Abs: 1.4 10*3/uL (ref 0.7–4.0)
MCH: 25.3 pg — ABNORMAL LOW (ref 26.0–34.0)
MCHC: 32.6 g/dL (ref 30.0–36.0)
MCV: 77.7 fL — ABNORMAL LOW (ref 78.0–100.0)
Monocytes Absolute: 0.7 10*3/uL (ref 0.1–1.0)
Monocytes Relative: 8 % (ref 3–12)
Neutro Abs: 6.1 10*3/uL (ref 1.7–7.7)
Neutrophils Relative %: 75 % (ref 43–77)
Platelets: 246 10*3/uL (ref 150–400)
RBC: 3.63 MIL/uL — ABNORMAL LOW (ref 3.87–5.11)
RDW: 16 % — ABNORMAL HIGH (ref 11.5–15.5)
WBC: 8.2 10*3/uL (ref 4.0–10.5)

## 2013-12-09 LAB — I-STAT CG4 LACTIC ACID, ED: Lactic Acid, Venous: 1.31 mmol/L (ref 0.5–2.2)

## 2013-12-09 LAB — LIPASE, BLOOD: Lipase: 10 U/L — ABNORMAL LOW (ref 11–59)

## 2013-12-09 IMAGING — CT CT ABD-PELV W/ CM
2 of 5 series · 16 of 46 positions shown, 18 images · IV contrast (APPLIED)
Comparison: Lumbar spine radiographs [DATE] and CT of the
abdomen and pelvis [DATE]

CLINICAL DATA: Back pain, nausea and vomiting. Constipation.
Bilateral flank pain.

EXAM:
CT ABDOMEN AND PELVIS WITH CONTRAST
TECHNIQUE: Multidetector CT imaging of the abdomen and pelvis was performed
using the standard protocol following bolus administration of
intravenous contrast.
CONTRAST:  100mL OMNIPAQUE IOHEXOL 300 MG/ML  SOLN

[Series 2: abd/ pelvis 5.0 i30f 1 · axial · 0.80mm/px · z∈[-362,+38]mm · 13 of 92 slices shown, 15 images]
[im 6/92  soft-tissue]
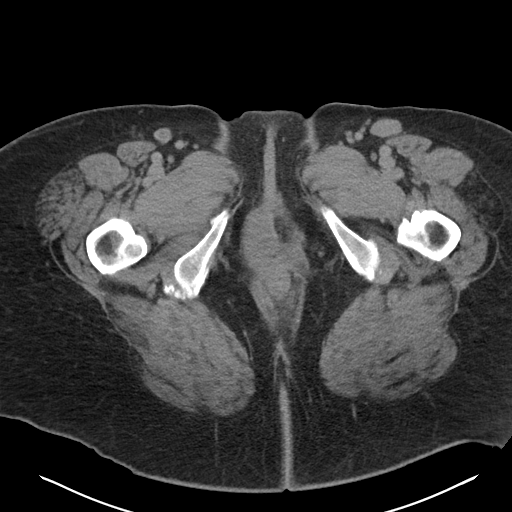
[im 6/92  bone]
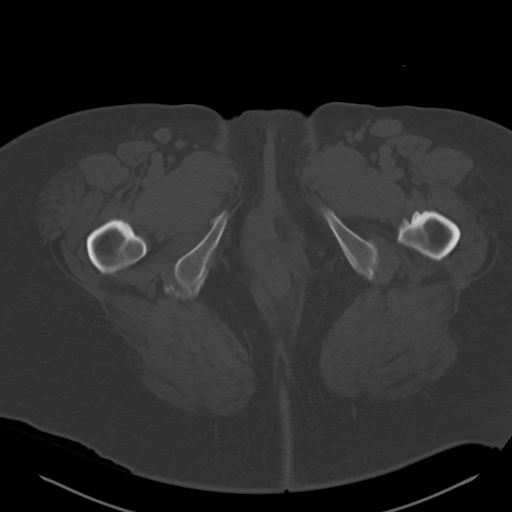
[im 11/92  soft-tissue]
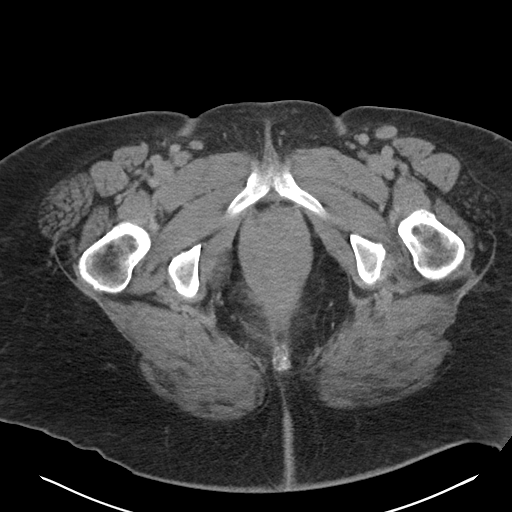
[im 21/92  soft-tissue]
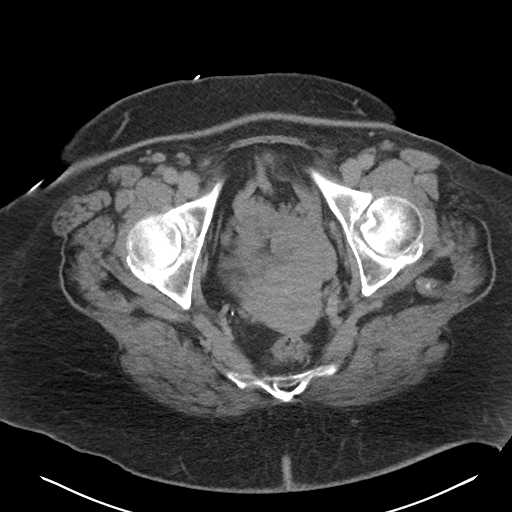
[im 26/92  soft-tissue]
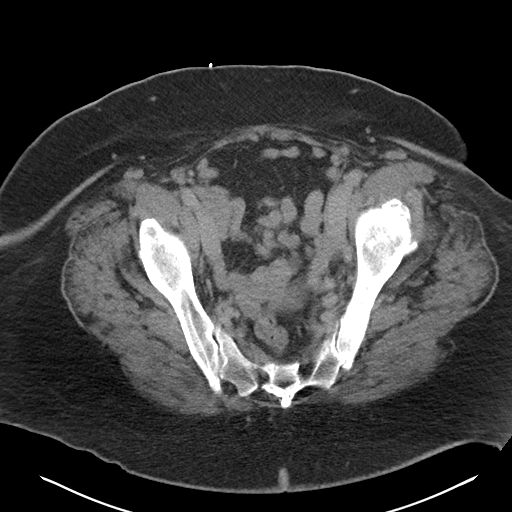
[im 31/92  soft-tissue]
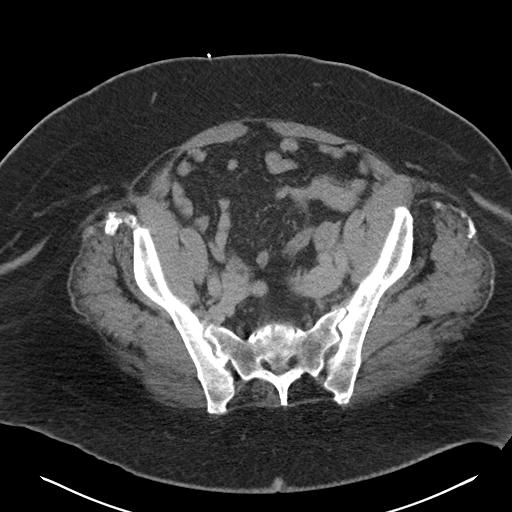
[im 41/92  soft-tissue]
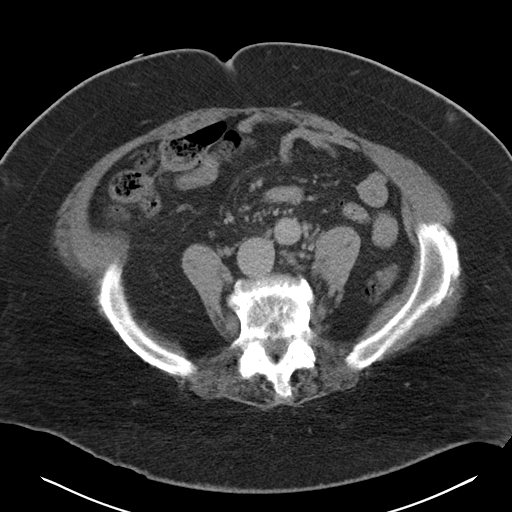
[im 46/92  soft-tissue]
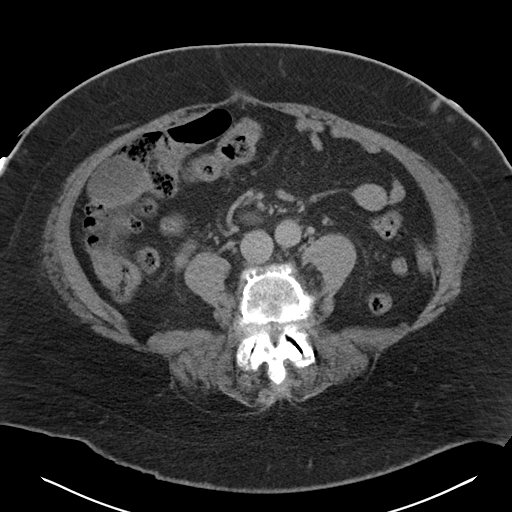
[im 51/92  soft-tissue]
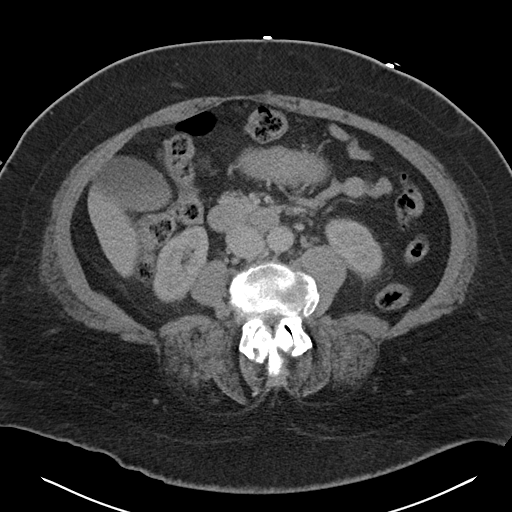
[im 61/92  soft-tissue]
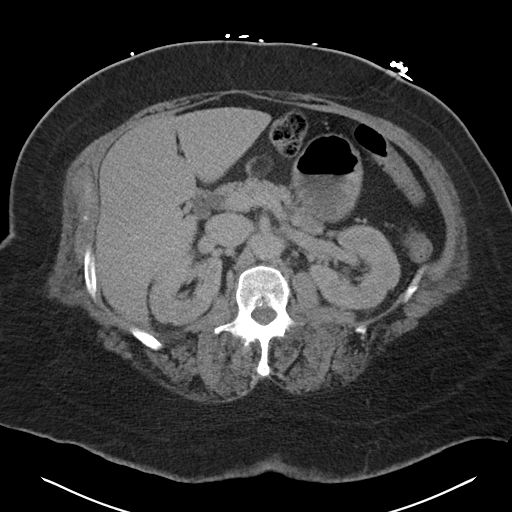
[im 61/92  bone]
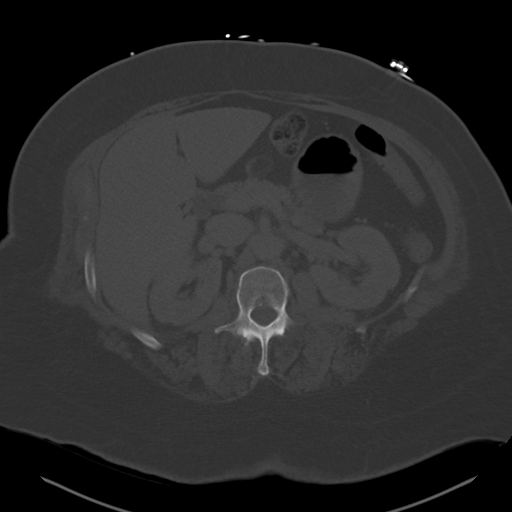
[im 66/92  soft-tissue]
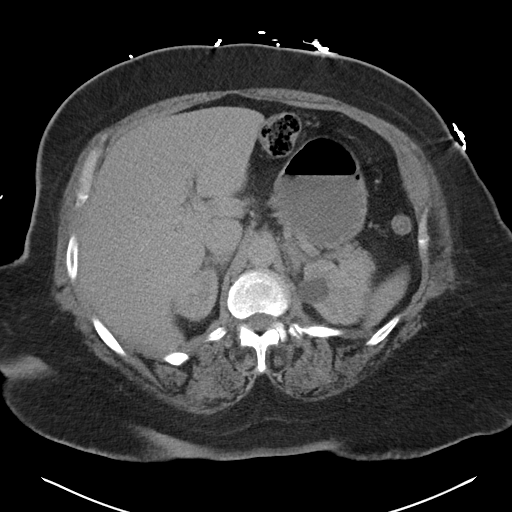
[im 71/92  soft-tissue]
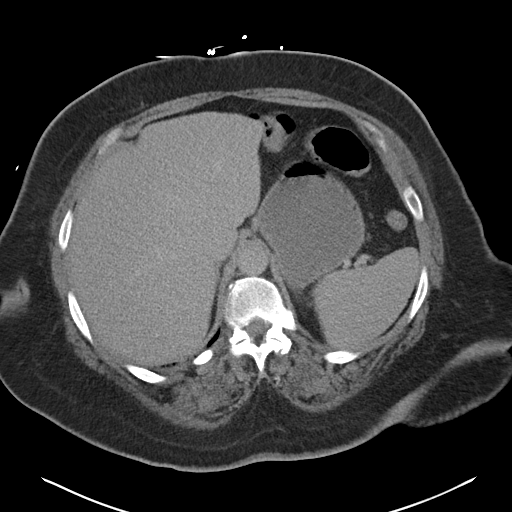
[im 81/92  soft-tissue]
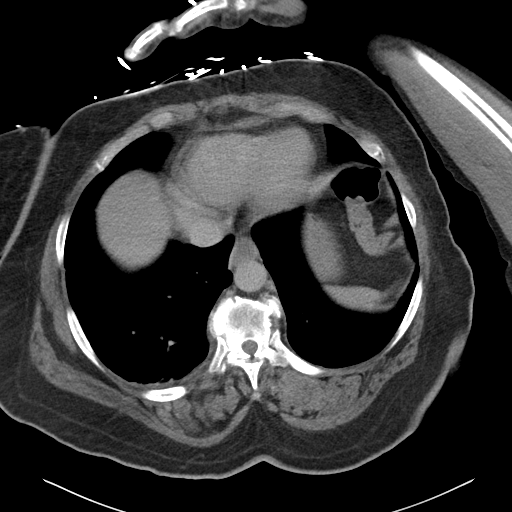
[im 86/92  soft-tissue]
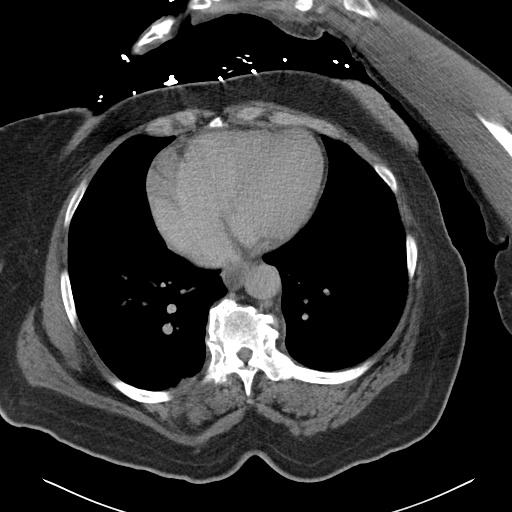

[Series 5: coronal soft tissue · coronal · 0.81mm/px · 3 of 109 slices shown]
[im 37/109  soft-tissue]
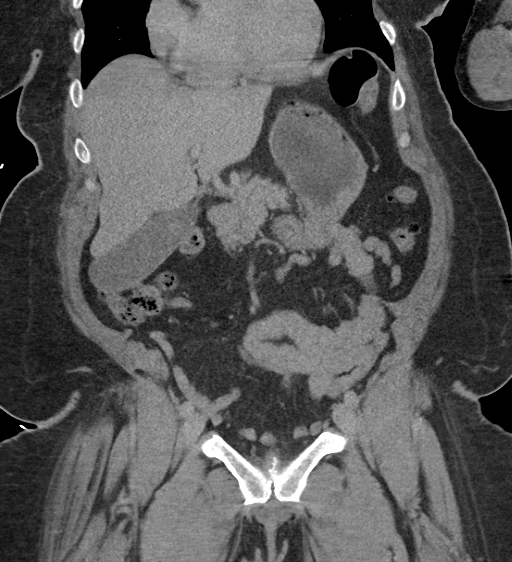
[im 49/109  soft-tissue]
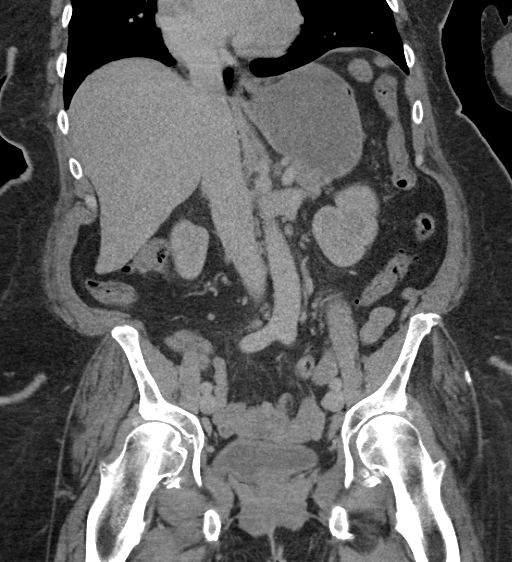
[im 61/109  soft-tissue]
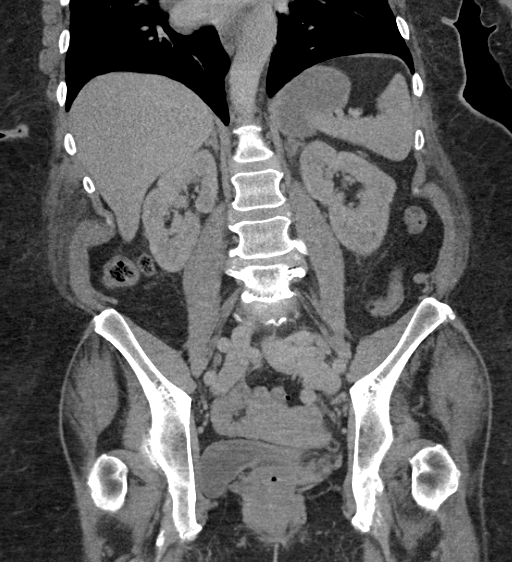

[16 of 46 positions shown; findings below may reference images not displayed]

FINDINGS: LUNG BASES: Included view of the lung bases demonstrate right lung
base atelectasis/scarring. Visualized heart and pericardium are
unremarkable. Fluid filled distal esophagus and reflect reflux.

SOLID ORGANS: The liver, spleen, gallbladder, pancreas and adrenal
glands are unremarkable.

GASTROINTESTINAL TRACT: The stomach, small and large bowel are
normal in course and caliber without inflammatory changes. Mild
sigmoid diverticulosis. Normal appendix.

KIDNEYS/ URINARY TRACT: Kidneys are orthotopic, demonstrating
symmetric enhancement. No nephrolithiasis, hydronephrosis or solid
renal masses. 2.5 cm left upper pole low-density cyst. The
unopacified ureters are normal in course and caliber. Delayed
imaging through the kidneys demonstrates symmetric prompt contrast
excretion within the proximal urinary collecting system. Urinary
bladder is partially distended and unremarkable.

PERITONEUM/RETROPERITONEUM: 2 cm mid abdomen mildly inflamed fatty
mass anterior the pancreas and near the transverse colon may reflect
epiploic appendagitis, axial 33/92. Phleboliths in the pelvis. No
intraperitoneal free fluid nor free air. Aortoiliac vessels are
normal in course and caliber. Bilateral inguinal lymphadenopathy
measuring up to 19 mm in short axis, stable. Internal reproductive
organs are unremarkable.

SOFT TISSUE/OSSEOUS STRUCTURES: Nonsuspicious. Dystrophic
calcifications at iliac crests can be seen with DISH. Moderate
degenerative change of the hips. Grade 1 L4-5 anterolisthesis on
spondylotic basis. Moderate canal stenosis L4-5, L3-4 and mild at
L5-S1. Severe L3-4 through L5-S1 neural foraminal narrowing. Small
fat containing umbilical hernia. Subcentimeter subcutaneous left
abdomen nodule may reflect injection sites.
IMPRESSION: Mildly inflamed central abdomen fatty 2 cm mass may reflect focal
appendagitis. No urolithiasis.

Bilateral inguinal lymphadenopathy, stable.

Moderate to severe lower lumbar spondylosis.

  By: PETRINA

## 2013-12-09 MED ORDER — IOHEXOL 300 MG/ML  SOLN
100.0000 mL | Freq: Once | INTRAMUSCULAR | Status: AC | PRN
  Administered 2013-12-09: 100 mL via INTRAVENOUS

## 2013-12-09 MED ORDER — HYDROCODONE-ACETAMINOPHEN 5-325 MG PO TABS
2.0000 | ORAL_TABLET | Freq: Once | ORAL | Status: AC
  Administered 2013-12-09: 2 via ORAL
  Filled 2013-12-09: qty 2

## 2013-12-09 MED ORDER — HYDROMORPHONE HCL PF 1 MG/ML IJ SOLN
1.0000 mg | Freq: Once | INTRAMUSCULAR | Status: AC
  Administered 2013-12-09: 1 mg via INTRAVENOUS
  Filled 2013-12-09: qty 1

## 2013-12-09 MED ORDER — SODIUM CHLORIDE 0.9 % IV BOLUS (SEPSIS)
1000.0000 mL | Freq: Once | INTRAVENOUS | Status: AC
  Administered 2013-12-09: 1000 mL via INTRAVENOUS

## 2013-12-09 MED ORDER — ONDANSETRON 4 MG PO TBDP
4.0000 mg | ORAL_TABLET | Freq: Once | ORAL | Status: AC
Start: 2013-12-09 — End: 2013-12-09
  Administered 2013-12-09: 4 mg via ORAL
  Filled 2013-12-09: qty 1

## 2013-12-09 MED ORDER — ONDANSETRON HCL 4 MG/2ML IJ SOLN
4.0000 mg | Freq: Once | INTRAMUSCULAR | Status: AC
  Administered 2013-12-09: 4 mg via INTRAVENOUS
  Filled 2013-12-09: qty 2

## 2013-12-09 NOTE — ED Provider Notes (Signed)
TIME SEEN: 8:35 PM  CHIEF COMPLAINT: Nausea, vomiting, dysuria, lower back pain  HPI: Patient is a 68 y.o. F with history of hypertension, SVT, aortic stenosis, diabetes who presents to the emergency department with complaints of bilateral flank pain and lower back pain that started 8 days ago. She is also had subjective fevers, chills, nausea, vomiting and dysuria. She states she was here and admitted to the hospital on 8/14 for the same and diagnosed with UTI. She states this was similar. She has had anorexia. She is not currently on antibiotics. She did have a fall injuring her back 3-4 weeks ago. She has had negative lumbar x-rays on 11/17/13. No other recent injury. She has had some numbness to the right lateral thigh that has been chronic. She also has a chronic history of urinary incontinence. No urinary retention, bowel incontinence, new numbness or focal weakness. No history of prior back surgery or injections in her back. No history of cancer. States the pain in her back radiates into her abdomen. No vaginal bleeding or discharge.  ROS: See HPI Constitutional: no fever  Eyes: no drainage  ENT: no runny nose   Cardiovascular:  no chest pain  Resp: no SOB  GI: no vomiting GU: no dysuria Integumentary: no rash  Allergy: no hives  Musculoskeletal: no leg swelling  Neurological: no slurred speech ROS otherwise negative  PAST MEDICAL HISTORY/PAST SURGICAL HISTORY:  Past Medical History  Diagnosis Date  . Edema   . Palpitations   . History of diastolic dysfunction     Echo 07/8544 with diastolic dysfunction  . PAC (premature atrial contraction)     per prior Holter  . PVC's (premature ventricular contractions)     per prior Holter  . Morbid obesity   . HTN (hypertension)   . Tachycardia     noted at 07/28/11 visit. Started on beta blocker. Possible atrial flutter vs long PR tachycardia/AVNRT  . SVT (supraventricular tachycardia)   . Aortic stenosis, mild 11/17/2013  . CHF  (congestive heart failure)   . Acute kidney injury   . Heart murmur   . Pneumonia     "as a child"  . Type II diabetes mellitus   . GERD (gastroesophageal reflux disease)   . Arthritis     "both knees, spine, back, fingers" (11/29/2013)  . OA (osteoarthritis)     MEDICATIONS:  Prior to Admission medications   Medication Sig Start Date End Date Taking? Authorizing Provider  acetaminophen (TYLENOL) 325 MG tablet Take 2 tablets (650 mg total) by mouth every 6 (six) hours as needed for mild pain (or Fever >/= 101). 11/22/13  Yes Delfina Redwood, MD  aspirin EC 81 MG tablet Take 81 mg by mouth daily with breakfast.    Yes Historical Provider, MD  furosemide (LASIX) 40 MG tablet Take 40 mg by mouth daily. 10/09/13  Yes Historical Provider, MD  losartan (COZAAR) 100 MG tablet Take 100 mg by mouth daily.   Yes Historical Provider, MD  metFORMIN (GLUCOPHAGE) 500 MG tablet Take 500 mg by mouth 2 (two) times daily. 10/30/13  Yes Historical Provider, MD  methocarbamol (ROBAXIN) 500 MG tablet Take 1 tablet (500 mg total) by mouth every 6 (six) hours as needed for muscle spasms. 12/07/13  Yes Olga Millers, MD  metoprolol (LOPRESSOR) 100 MG tablet Take 100 mg by mouth 2 (two) times daily.   Yes Historical Provider, MD  omeprazole (PRILOSEC) 20 MG capsule Take 20 mg by mouth 2 (two) times daily  before a meal.   Yes Historical Provider, MD  traMADol (ULTRAM) 50 MG tablet Take 50 mg by mouth every 6 (six) hours as needed for moderate pain.   Yes Historical Provider, MD  metoprolol (LOPRESSOR) 25 MG tablet Take 1 tablet (25 mg total) by mouth 2 (two) times daily. 11/22/13   Delfina Redwood, MD    ALLERGIES:  Allergies  Allergen Reactions  . Oxycodone Hcl Nausea And Vomiting  . Penicillins Itching    SOCIAL HISTORY:  History  Substance Use Topics  . Smoking status: Former Smoker -- 2.00 packs/day for 15 years    Types: Cigarettes    Quit date: 11/23/1985  . Smokeless tobacco: Never Used  .  Alcohol Use: No    FAMILY HISTORY: Family History  Problem Relation Age of Onset  . Alzheimer's disease Mother   . Lung cancer Mother   . COPD Father   . Diabetes Maternal Uncle   . Stroke Neg Hx     EXAM: BP 158/79  Pulse 102  Temp(Src) 99.2 F (37.3 C) (Oral)  Resp 25  Ht 5\' 11"  (1.803 m)  Wt 300 lb (136.079 kg)  BMI 41.86 kg/m2  SpO2 96% CONSTITUTIONAL: Alert and oriented and responds appropriately to questions. Well-appearing; well-nourished, appears uncomfortable but is nontoxic HEAD: Normocephalic EYES: Conjunctivae clear, PERRL ENT: normal nose; no rhinorrhea; moist mucous membranes; pharynx without lesions noted NECK: Supple, no meningismus, no LAD  CARD: Regular and tachycardic; S1 and S2 appreciated; no murmurs, no clicks, no rubs, no gallops RESP: Normal chest excursion without splinting or tachypnea; breath sounds clear and equal bilaterally; no wheezes, no rhonchi, no rales,  ABD/GI: Normal bowel sounds; non-distended; soft, non-tender, no rebound, no guarding BACK:  The back appears normal; there is no CVA tenderness, patient has tenderness over her bilateral flank with light palpation, no lesions noted, no midline spinal tenderness or step-off or deformity EXT: Normal ROM in all joints; non-tender to palpation; no edema; normal capillary refill; no cyanosis    SKIN: Normal color for age and race; warm NEURO: Moves all extremities equally, sensation to light touch intact diffusely, cranial nerves 2 through contact, 2+2 reflexes in bilateral lower extremities PSYCH: The patient's mood and manner are appropriate. Grooming and personal hygiene are appropriate.  MEDICAL DECISION MAKING: Patient here with bilateral flank pain, dysuria, vomiting or diarrhea. States this is similar to her prior episodes of UTI. Will obtain labs, cultures, urine. Will give IV fluids, pain and nausea medication. Obtain CT scan to evaluate for possible infected stone, pyelonephritis. Her  suspect this is more likely musculoskeletal in nature given that it is reproducible with palpation of her back. She is not febrile in the emergency department. Rectal temperature normal. Doubt epidural abscess, spinal stenosis, discitis, transverse myelitis.  ED PROGRESS: Urine shows trace hematuria which may be secondary to her catheterization. No other sign of infection. CT scan shows possible appendicitis she has absolutely no abdominal pain on exam. She does have spondylosis seen on her CT scan but no acute fracture. Suspect this is musculoskeletal in nature. She has no neurologic deficits on exam. I feel she is safe to be discharged home. We'll discharge with prescription for ibuprofen, Vicodin, Zofran. Discussed strict return precautions and supportive care instructions. Have advised outpatient PCP followup. She verbalized understanding and is comfortable plan.    Date: 12/09/2013 19:53  Rate: 102  Rhythm:  Sinus tachycardia  QRS Axis: normal  Intervals: Nonspecific intraventricular conduction no eye  ST/T Wave abnormalities:  normal  Conduction Disutrbances: none  Narrative Interpretation: Sinus tachycardia, LVH, nonspecific intraventricular conduction delay; no significant change from prior EKG other than her rate is faster now     [employer], DO 12/10/13 0012

## 2013-12-09 NOTE — ED Notes (Addendum)
Pt informed to hold metformin for 48 hours after IV contrast, pt verbalize understanding.

## 2013-12-09 NOTE — ED Notes (Signed)
Pt to ED via EMS with c/o lower back and bilateral flank pain associated with nausea and vomiting, onset x1 week. Pt states symptoms feels like when she had UTI 2 weeks ago. Per EMS, BP-150/89, RR-18, pulse-99, SpO2-97% on room air. Pt was discharged from the hospital on Thursday last week. 20G right A/C line placed by EMS. Pt alerts and oriented x4 this time, airway intact.

## 2013-12-09 NOTE — ED Notes (Signed)
Unable to obtain second set of blood culture 

## 2013-12-10 MED ORDER — ONDANSETRON 4 MG PO TBDP: 4.0000 mg | ORAL_TABLET | Freq: Three times a day (TID) | ORAL | Status: DC | PRN

## 2013-12-10 MED ORDER — IBUPROFEN 600 MG PO TABS: 600.0000 mg | ORAL_TABLET | Freq: Three times a day (TID) | ORAL | Status: DC | PRN

## 2013-12-10 MED ORDER — HYDROCODONE-ACETAMINOPHEN 5-325 MG PO TABS: 1.0000 | ORAL_TABLET | ORAL | Status: DC | PRN

## 2013-12-10 NOTE — Discharge Instructions (Signed)
Back Pain, Adult Low back pain is very common. About 1 in 5 people have back pain.The cause of low back pain is rarely dangerous. The pain often gets better over time.About half of people with a sudden onset of back pain feel better in just 2 weeks. About 8 in 10 people feel better by 6 weeks.  CAUSES Some common causes of back pain include:  Strain of the muscles or ligaments supporting the spine.  Wear and tear (degeneration) of the spinal discs.  Arthritis.  Direct injury to the back. DIAGNOSIS Most of the time, the direct cause of low back pain is not known.However, back pain can be treated effectively even when the exact cause of the pain is unknown.Answering your caregiver's questions about your overall health and symptoms is one of the most accurate ways to make sure the cause of your pain is not dangerous. If your caregiver needs more information, he or she may order lab work or imaging tests (X-rays or MRIs).However, even if imaging tests show changes in your back, this usually does not require surgery. HOME CARE INSTRUCTIONS For many people, back pain returns.Since low back pain is rarely dangerous, it is often a condition that people can learn to manageon their own.   Remain active. It is stressful on the back to sit or stand in one place. Do not sit, drive, or stand in one place for more than 30 minutes at a time. Take short walks on level surfaces as soon as pain allows.Try to increase the length of time you walk each day.  Do not stay in bed.Resting more than 1 or 2 days can delay your recovery.  Do not avoid exercise or work.Your body is made to move.It is not dangerous to be active, even though your back may hurt.Your back will likely heal faster if you return to being active before your pain is gone.  Pay attention to your body when you bend and lift. Many people have less discomfortwhen lifting if they bend their knees, keep the load close to their bodies,and  avoid twisting. Often, the most comfortable positions are those that put less stress on your recovering back.  Find a comfortable position to sleep. Use a firm mattress and lie on your side with your knees slightly bent. If you lie on your back, put a pillow under your knees.  Only take over-the-counter or prescription medicines as directed by your caregiver. Over-the-counter medicines to reduce pain and inflammation are often the most helpful.Your caregiver may prescribe muscle relaxant drugs.These medicines help dull your pain so you can more quickly return to your normal activities and healthy exercise.  Put ice on the injured area.  Put ice in a plastic bag.  Place a towel between your skin and the bag.  Leave the ice on for 15-20 minutes, 03-04 times a day for the first 2 to 3 days. After that, ice and heat may be alternated to reduce pain and spasms.  Ask your caregiver about trying back exercises and gentle massage. This may be of some benefit.  Avoid feeling anxious or stressed.Stress increases muscle tension and can worsen back pain.It is important to recognize when you are anxious or stressed and learn ways to manage it.Exercise is a great option. SEEK MEDICAL CARE IF:  You have pain that is not relieved with rest or medicine.  You have pain that does not improve in 1 week.  You have new symptoms.  You are generally not feeling well. SEEK   IMMEDIATE MEDICAL CARE IF:   You have pain that radiates from your back into your legs.  You develop new bowel or bladder control problems.  You have unusual weakness or numbness in your arms or legs.  You develop nausea or vomiting.  You develop abdominal pain.  You feel faint. Document Released: 03/23/2005 Document Revised: 09/22/2011 Document Reviewed: 07/25/2013 Exit[employer] Patient Information 2015 [employer]. This information is not intended to replace advice given to you by your health care provider. Make sure you  discuss any questions you have with your health care provider.  

## 2013-12-11 LAB — URINE CULTURE
Colony Count: NO GROWTH
Culture: NO GROWTH

## 2013-12-12 ENCOUNTER — Telehealth: Payer: Self-pay

## 2013-12-12 DIAGNOSIS — R5381 Other malaise: Secondary | ICD-10-CM

## 2013-12-12 DIAGNOSIS — I872 Venous insufficiency (chronic) (peripheral): Secondary | ICD-10-CM

## 2013-12-12 DIAGNOSIS — L02419 Cutaneous abscess of limb, unspecified: Secondary | ICD-10-CM

## 2013-12-12 DIAGNOSIS — R0602 Shortness of breath: Secondary | ICD-10-CM

## 2013-12-12 DIAGNOSIS — N179 Acute kidney failure, unspecified: Secondary | ICD-10-CM

## 2013-12-12 DIAGNOSIS — L03119 Cellulitis of unspecified part of limb: Secondary | ICD-10-CM

## 2013-12-12 DIAGNOSIS — I471 Supraventricular tachycardia: Secondary | ICD-10-CM

## 2013-12-12 NOTE — Telephone Encounter (Signed)
Vanessa(RN @ [facility]) is requesting a verbal order for a social worker to go assist patient with limited financial resources. Verbal given.

## 2013-12-14 ENCOUNTER — Other Ambulatory Visit: Payer: Self-pay | Admitting: Cardiology

## 2013-12-16 LAB — CULTURE, BLOOD (ROUTINE X 2): Culture: NO GROWTH

## 2013-12-23 ENCOUNTER — Other Ambulatory Visit: Payer: Self-pay | Admitting: Internal Medicine

## 2014-01-03 ENCOUNTER — Ambulatory Visit (INDEPENDENT_AMBULATORY_CARE_PROVIDER_SITE_OTHER): Payer: Medicare Other | Admitting: Internal Medicine

## 2014-01-03 ENCOUNTER — Encounter: Payer: Self-pay | Admitting: Internal Medicine

## 2014-01-03 VITALS — BP 122/42 | HR 71 | Ht 71.0 in | Wt 310.6 lb

## 2014-01-03 DIAGNOSIS — Z9889 Other specified postprocedural states: Secondary | ICD-10-CM

## 2014-01-03 DIAGNOSIS — Z8679 Personal history of other diseases of the circulatory system: Secondary | ICD-10-CM

## 2014-01-03 DIAGNOSIS — I471 Supraventricular tachycardia: Secondary | ICD-10-CM

## 2014-01-03 DIAGNOSIS — I498 Other specified cardiac arrhythmias: Secondary | ICD-10-CM

## 2014-01-03 DIAGNOSIS — I10 Essential (primary) hypertension: Secondary | ICD-10-CM

## 2014-01-03 NOTE — Assessment & Plan Note (Signed)
She has had no recurrent symptoms since her ablation. She could be weaned off of her high dose beta blocker but I will defer subsequent blood pressure control to her primary MD.

## 2014-01-03 NOTE — Assessment & Plan Note (Signed)
Her blood pressure is good today. No change in meds.

## 2014-01-03 NOTE — Patient Instructions (Signed)
Your physician recommends that you continue on your current medications as directed. Please refer to the Current Medication list given to you today.  No follow up is needed at this time with Dr. Taylor. He will see you on an as needed basis.  

## 2014-01-03 NOTE — Progress Notes (Signed)
HPI Mrs. Hellickson returns today for followup. 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 She has done well in the interim. No palpitations. No chest pain or sob. She is on fairly high does metoprolol.  Allergies  Allergen Reactions  . Oxycodone Hcl Nausea And Vomiting  . Penicillins Itching     Current Outpatient Prescriptions  Medication Sig Dispense Refill  . acetaminophen (TYLENOL) 325 MG tablet Take 2 tablets (650 mg total) by mouth every 6 (six) hours as needed for mild pain (or Fever >/= 101).      Marland Kitchen aspirin EC 81 MG tablet Take 81 mg by mouth daily with breakfast.       . furosemide (LASIX) 40 MG tablet Take 40 mg by mouth daily.      Marland Kitchen HYDROcodone-acetaminophen (NORCO/VICODIN) 5-325 MG per tablet Take 1 tablet by mouth every 4 (four) hours as needed.  15 tablet  0  . ibuprofen (ADVIL,MOTRIN) 600 MG tablet Take 1 tablet (600 mg total) by mouth every 8 (eight) hours as needed.  15 tablet  0  . losartan (COZAAR) 100 MG tablet Take 100 mg by mouth daily.      . metFORMIN (GLUCOPHAGE) 500 MG tablet Take 500 mg by mouth 2 (two) times daily.      . methocarbamol (ROBAXIN) 500 MG tablet Take 1 tablet (500 mg total) by mouth every 6 (six) hours as needed for muscle spasms.  30 tablet  0  . metoprolol (LOPRESSOR) 100 MG tablet TAKE 1 TABLET BY MOUTH TWICE A DAY  180 tablet  0  . omeprazole (PRILOSEC) 20 MG capsule Take 20 mg by mouth 2 (two) times daily before a meal.      . ondansetron (ZOFRAN ODT) 4 MG disintegrating tablet Take 1 tablet (4 mg total) by mouth every 8 (eight) hours as needed for nausea or vomiting.  20 tablet  0  . traMADol (ULTRAM) 50 MG tablet Take 50 mg by mouth every 6 (six) hours as needed for moderate pain.       No current facility-administered medications for this visit.     Past Medical History  Diagnosis Date  . Edema   . Palpitations   . History of  diastolic dysfunction     Echo 05/8411 with diastolic dysfunction  . PAC (premature atrial contraction)     per prior Holter  . PVC's (premature ventricular contractions)     per prior Holter  . Morbid obesity   . HTN (hypertension)   . Tachycardia     noted at 07/28/11 visit. Started on beta blocker. Possible atrial flutter vs long PR tachycardia/AVNRT  . SVT (supraventricular tachycardia)   . Aortic stenosis, mild 11/17/2013  . CHF (congestive heart failure)   . Acute kidney injury   . Heart murmur   . Pneumonia     "as a child"  . Type II diabetes mellitus   . GERD (gastroesophageal reflux disease)   . Arthritis     "both knees, spine, back, fingers" (11/29/2013)  . OA (osteoarthritis)     ROS:   All systems reviewed and negative except as noted in the HPI.   Past Surgical History  Procedure Laterality Date  . Cesarean section  1985  . US echocardiography  07/25/2008    EF 55-60%  . Supraventricular tachycardia ablation  11/29/2013     Family History  Problem Relation Age of Onset  . Alzheimer's disease Mother   . Lung cancer Mother   . COPD Father   . Diabetes Maternal Uncle   . Stroke Neg Hx      History   Social History  . Marital Status: Single    Spouse Name: N/A    Number of Children: 2  . Years of Education: N/A   Occupational History  . 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 Smoking status: Former Smoker -- 2.00 packs/day for 15 years    Types: Cigarettes    Quit date: 11/23/1985  . Smokeless tobacco: Never Used  . Alcohol Use: No  . Drug Use: No  . Sexual Activity: No   Other Topics Concern  . Not on file   Social History Narrative  . No narrative on file     BP 122/42  Pulse 71  Ht 5\' 11"  (1.803 m)  Wt 310 lb 9.6 oz (140.887 kg)  BMI 43.34 kg/m2  Physical Exam:  obese appearing 68 yo woman,NAD HEENT: Unremarkable Neck:  No JVD, no thyromegally Back:  No CVA tenderness Lungs:  Clear with no  wheezes HEART:  Regular rate rhythm, no murmurs, no rubs, no clicks Abd:  soft, positive bowel sounds, no organomegally, no rebound, no guarding Ext:  2 plus pulses, 3+ woody edema, no cyanosis, no clubbing Skin:  No rashes no nodules Neuro:  CN II through XII intact, motor grossly intact  EKG - nsr with LVH  Assess/Plan:

## 2014-01-05 ENCOUNTER — Ambulatory Visit (INDEPENDENT_AMBULATORY_CARE_PROVIDER_SITE_OTHER): Payer: Medicare Other | Admitting: Internal Medicine

## 2014-01-05 ENCOUNTER — Encounter: Payer: Self-pay | Admitting: Internal Medicine

## 2014-01-05 VITALS — BP 138/82 | HR 88 | Temp 97.6°F | Resp 18 | Ht 71.0 in | Wt 330.8 lb

## 2014-01-05 DIAGNOSIS — Z9181 History of falling: Secondary | ICD-10-CM

## 2014-01-05 DIAGNOSIS — E1159 Type 2 diabetes mellitus with other circulatory complications: Secondary | ICD-10-CM

## 2014-01-05 DIAGNOSIS — I10 Essential (primary) hypertension: Secondary | ICD-10-CM

## 2014-01-05 DIAGNOSIS — I471 Supraventricular tachycardia: Secondary | ICD-10-CM

## 2014-01-05 DIAGNOSIS — M1990 Unspecified osteoarthritis, unspecified site: Secondary | ICD-10-CM

## 2014-01-05 DIAGNOSIS — I878 Other specified disorders of veins: Secondary | ICD-10-CM

## 2014-01-05 DIAGNOSIS — Z23 Encounter for immunization: Secondary | ICD-10-CM

## 2014-01-05 DIAGNOSIS — L03116 Cellulitis of left lower limb: Secondary | ICD-10-CM

## 2014-01-05 DIAGNOSIS — R5381 Other malaise: Secondary | ICD-10-CM

## 2014-01-05 MED ORDER — MEDICAL COMPRESSION STOCKINGS MISC: Status: DC

## 2014-01-05 NOTE — Assessment & Plan Note (Signed)
Rx for compression stocking given today. Would like 30-40 compression but doubt patient would be able to get on so will try 20-30 mm compression. Given the chronicity and hardened skin around the shins and may never fully resolve.

## 2014-01-05 NOTE — Assessment & Plan Note (Signed)
Appears to be resolved but with some skin breakdown and stigmata of chronic venous insufficiency and scratching.

## 2014-01-05 NOTE — Assessment & Plan Note (Signed)
Last HgA1c 6.7 on metformin 500 mg BID. Will continue and recheck at next visit. Foot exam performed today.

## 2014-01-05 NOTE — Patient Instructions (Signed)
We have given you the flu shot today and will get you some compression stockings. These may help to reduce the amount of swelling in your legs but since you have had the swelling for some time it may not go away for some time.  Keep working with the physical therapist at home to work on your mobility. Come back to see [place] in about 3-4 months.   Fall Prevention and Home Safety Falls cause injuries and can affect all age groups. It is possible to use preventive measures to significantly decrease the likelihood of falls. There are many simple measures which can make your home safer and prevent falls. OUTDOORS  Repair cracks and edges of walkways and driveways.  Remove high doorway thresholds.  Trim shrubbery on the main path into your home.  Have good outside lighting.  Clear walkways of tools, rocks, debris, and clutter.  Check that handrails are not broken and are securely fastened. Both sides of steps should have handrails.  Have leaves, snow, and ice cleared regularly.  Use sand or salt on walkways during winter months.  In the garage, clean up grease or oil spills. BATHROOM  Install night lights.  Install grab bars by the toilet and in the tub and shower.  Use non-skid mats or decals in the tub or shower.  Place a plastic non-slip stool in the shower to sit on, if needed.  Keep floors dry and clean up all water on the floor immediately.  Remove soap buildup in the tub or shower on a regular basis.  Secure bath mats with non-slip, double-sided rug tape.  Remove throw rugs and tripping hazards from the floors. BEDROOMS  Install night lights.  Make sure a bedside light is easy to reach.  Do not use oversized bedding.  Keep a telephone by your bedside.  Have a firm chair with side arms to use for getting dressed.  Remove throw rugs and tripping hazards from the floor. KITCHEN  Keep handles on pots and pans turned toward the center of the stove. Use back burners  when possible.  Clean up spills quickly and allow time for drying.  Avoid walking on wet floors.  Avoid hot utensils and knives.  Position shelves so they are not too high or low.  Place commonly used objects within easy reach.  If necessary, use a sturdy step stool with a grab bar when reaching.  Keep electrical cables out of the way.  Do not use floor polish or wax that makes floors slippery. If you must use wax, use non-skid floor wax.  Remove throw rugs and tripping hazards from the floor. STAIRWAYS  Never leave objects on stairs.  Place handrails on both sides of stairways and use them. Fix any loose handrails. Make sure handrails on both sides of the stairways are as long as the stairs.  Check carpeting to make sure it is firmly attached along stairs. Make repairs to worn or loose carpet promptly.  Avoid placing throw rugs at the top or bottom of stairways, or properly secure the rug with carpet tape to prevent slippage. Get rid of throw rugs, if possible.  Have an electrician put in a light switch at the top and bottom of the stairs. OTHER FALL PREVENTION TIPS  Wear low-heel or rubber-soled shoes that are supportive and fit well. Wear closed toe shoes.  When using a stepladder, make sure it is fully opened and both spreaders are firmly locked. Do not climb a closed stepladder.  Add  color or contrast paint or tape to grab bars and handrails in your home. Place contrasting color strips on first and last steps.  Learn and use mobility aids as needed. Install an electrical emergency response system.  Turn on lights to avoid dark areas. Replace light bulbs that burn out immediately. Get light switches that glow.  Arrange furniture to create clear pathways. Keep furniture in the same place.  Firmly attach carpet with non-skid or double-sided tape.  Eliminate uneven floor surfaces.  Select a carpet pattern that does not visually hide the edge of steps.  Be aware of  all pets. OTHER HOME SAFETY TIPS  Set the water temperature for 120 F (48.8 C).  Keep emergency numbers on or near the telephone.  Keep smoke detectors on every level of the home and near sleeping areas. Document Released: 03/13/2002 Document Revised: 09/22/2011 Document Reviewed: 06/12/2011 [facility] Patient Information 2015 [place], [place]. This information is not intended to replace advice given to you by your health care provider. Make sure you discuss any questions you have with your health care provider.

## 2014-01-05 NOTE — Progress Notes (Signed)
   Subjective:    Patient ID: [name] [name], female    DOB: [birth year], 68 y.o.   MRN: [id number]  HPI The patient is a 68 YO female who is coming in today to establish care. She has been in and out of the hospital several times recently and those notes reviewed. She is still struggling with some upper back pain from a muscle strain and working with PT at home. She is using a walker and feels encombered by it as it is hard to maneuver but the physical therapist thinks she is safer walking with it. She also had a heart ablation recently and denies any palpitations or fast heart beat since that time. She denies chest pains, SOB, abdominal pain. She is having some mild pain in her back but overall her pain is well controlled now. She does have arthritis but deals with it okay.   Review of Systems  Constitutional: Positive for activity change. Negative for fever, appetite change and fatigue.  HENT: Negative.   Eyes: Negative.   Respiratory: Negative for cough, chest tightness, shortness of breath and wheezing.   Cardiovascular: Positive for leg swelling. Negative for chest pain and palpitations.  Gastrointestinal: Negative for abdominal pain, diarrhea, constipation and abdominal distention.  Musculoskeletal: Positive for back pain and myalgias.  Neurological: Negative for weakness, light-headedness and headaches.      Objective:   Physical Exam  Vitals reviewed. Constitutional: She is oriented to person, place, and time. She appears well-developed and well-nourished.  obese  HENT:  Head: Normocephalic and atraumatic.  Eyes: EOM are normal.  Neck: Normal range of motion.  Cardiovascular: Normal rate and regular rhythm.   Pulmonary/Chest: Effort normal and breath sounds normal. No respiratory distress. She has no wheezes. She has no rales.  Abdominal: Soft. Bowel sounds are normal. She exhibits no distension. There is no tenderness. There is no rebound.  Musculoskeletal: She exhibits edema.    Neurological: She is alert and oriented to person, place, and time. Coordination abnormal.  Walks hunched over walker and fairly steady.  Skin:  Shins with thickened skin and open areas with stigmata of cracking and scratching. No signs of infection or cellulitis.   Filed Vitals:   01/05/14 1003  BP: 138/82  Pulse: 88  Temp: 97.6 F (36.4 C)  TempSrc: Oral  Resp: 18  Height: 5\' 11"  (1.803 m)  Weight: 330 lb 12.8 oz (150.05 kg)  SpO2: 95%      Assessment & Plan:

## 2014-01-05 NOTE — Assessment & Plan Note (Signed)
She is using the ibuprofen, robaxin, tylenol as needed but not all the time.

## 2014-01-05 NOTE — Progress Notes (Signed)
Pre visit review using our clinic review tool, if applicable. No additional management support is needed unless otherwise documented below in the visit note. 

## 2014-01-05 NOTE — Assessment & Plan Note (Signed)
No recurrence since ablation.

## 2014-01-05 NOTE — Assessment & Plan Note (Signed)
Working with PT at home for mobility.

## 2014-01-05 NOTE — Assessment & Plan Note (Signed)
BP well controlled on current lasix, metoprolol, losartan. If BP drops can titrate regimen down.

## 2014-01-05 NOTE — Assessment & Plan Note (Signed)
PT ongoing at home and would continue til strength recovered.

## 2014-01-05 NOTE — Assessment & Plan Note (Signed)
No fall since leaving hospital and using walker.

## 2014-01-10 ENCOUNTER — Other Ambulatory Visit: Payer: Self-pay | Admitting: Internal Medicine

## 2014-01-12 ENCOUNTER — Other Ambulatory Visit: Payer: Self-pay | Admitting: *Deleted

## 2014-01-12 MED ORDER — LOSARTAN POTASSIUM 100 MG PO TABS: 100.0000 mg | ORAL_TABLET | Freq: Every day | ORAL | Status: DC

## 2014-01-23 ENCOUNTER — Telehealth: Payer: Self-pay | Admitting: Internal Medicine

## 2014-01-23 MED ORDER — METFORMIN HCL 500 MG PO TABS: 500.0000 mg | ORAL_TABLET | Freq: Two times a day (BID) | ORAL | Status: DC

## 2014-01-23 NOTE — Telephone Encounter (Signed)
Notified pt rx sent to [facility] 

## 2014-01-23 NOTE — Telephone Encounter (Signed)
Patient is requesting script for metformin.  She has been out for two weeks.  [facility] uses [facility] on [address].

## 2014-02-20 ENCOUNTER — Telehealth: Payer: Self-pay | Admitting: Internal Medicine

## 2014-03-15 ENCOUNTER — Encounter (HOSPITAL_COMMUNITY): Payer: Self-pay | Admitting: Internal Medicine

## 2014-04-11 ENCOUNTER — Other Ambulatory Visit: Payer: Self-pay

## 2014-04-11 MED ORDER — METOPROLOL TARTRATE 100 MG PO TABS: 100.0000 mg | ORAL_TABLET | Freq: Two times a day (BID) | ORAL | Status: DC

## 2014-04-19 ENCOUNTER — Encounter (HOSPITAL_COMMUNITY): Payer: Self-pay | Admitting: Internal Medicine

## 2014-05-02 ENCOUNTER — Other Ambulatory Visit: Payer: Self-pay

## 2014-05-02 MED ORDER — FUROSEMIDE 40 MG PO TABS: 40.0000 mg | ORAL_TABLET | Freq: Every day | ORAL | Status: DC

## 2014-05-10 ENCOUNTER — Telehealth: Payer: Self-pay | Admitting: Geriatric Medicine

## 2014-05-10 ENCOUNTER — Telehealth: Payer: Self-pay | Admitting: Internal Medicine

## 2014-05-10 ENCOUNTER — Ambulatory Visit (INDEPENDENT_AMBULATORY_CARE_PROVIDER_SITE_OTHER): Payer: Medicare Other | Admitting: Internal Medicine

## 2014-05-10 ENCOUNTER — Encounter: Payer: Self-pay | Admitting: Internal Medicine

## 2014-05-10 ENCOUNTER — Other Ambulatory Visit (INDEPENDENT_AMBULATORY_CARE_PROVIDER_SITE_OTHER): Payer: Medicare Other

## 2014-05-10 VITALS — BP 158/82 | HR 82 | Temp 97.6°F | Resp 18 | Ht 71.0 in | Wt 317.8 lb

## 2014-05-10 DIAGNOSIS — R5381 Other malaise: Secondary | ICD-10-CM

## 2014-05-10 DIAGNOSIS — Z1239 Encounter for other screening for malignant neoplasm of breast: Secondary | ICD-10-CM

## 2014-05-10 DIAGNOSIS — E1159 Type 2 diabetes mellitus with other circulatory complications: Secondary | ICD-10-CM

## 2014-05-10 DIAGNOSIS — E1151 Type 2 diabetes mellitus with diabetic peripheral angiopathy without gangrene: Secondary | ICD-10-CM

## 2014-05-10 DIAGNOSIS — I878 Other specified disorders of veins: Secondary | ICD-10-CM

## 2014-05-10 DIAGNOSIS — I10 Essential (primary) hypertension: Secondary | ICD-10-CM

## 2014-05-10 DIAGNOSIS — Z0000 Encounter for general adult medical examination without abnormal findings: Secondary | ICD-10-CM

## 2014-05-10 DIAGNOSIS — Z1211 Encounter for screening for malignant neoplasm of colon: Secondary | ICD-10-CM

## 2014-05-10 LAB — BASIC METABOLIC PANEL
BUN: 13 mg/dL (ref 6–23)
CO2: 26 mEq/L (ref 19–32)
Calcium: 9.5 mg/dL (ref 8.4–10.5)
Chloride: 105 mEq/L (ref 96–112)
Creatinine, Ser: 0.82 mg/dL (ref 0.40–1.20)
GFR: 89.27 mL/min (ref >60.00)
Glucose, Bld: 112 mg/dL — ABNORMAL HIGH (ref 70–99)
Potassium: 3.7 mEq/L (ref 3.5–5.1)
Sodium: 139 mEq/L (ref 135–145)

## 2014-05-10 LAB — HEMOGLOBIN A1C: Hgb A1c MFr Bld: 6.2 % (ref 4.6–6.5)

## 2014-05-10 MED ORDER — HYDROCHLOROTHIAZIDE 12.5 MG PO CAPS: 12.5000 mg | ORAL_CAPSULE | Freq: Every day | ORAL | Status: DC

## 2014-05-10 MED ORDER — MEDICAL COMPRESSION STOCKINGS MISC: Status: DC

## 2014-05-10 MED ORDER — LOSARTAN POTASSIUM 100 MG PO TABS: 100.0000 mg | ORAL_TABLET | Freq: Every day | ORAL | Status: DC

## 2014-05-10 NOTE — Assessment & Plan Note (Signed)
This is likely the source of many of her health issues including pain, diabetes, hypertension, hyperlipidemia. Encouraged her to continue with exercise (doing chair exercises at least) and with her newer healthier eating habits. Reminded that 1 pound weight loss is 4 pounds of pressure off her knees.

## 2014-05-10 NOTE — Assessment & Plan Note (Signed)
Reprinted the prescription for the compression stockings and reminded her about what we talked about last time. The fluid has been in her legs for a long time and has caused changes in the appearance and skin that may not be reversible even if we try to get the fluid out. She did not remember hearing this. She will try to keep legs elevated and get the compression stockings. She wants referral to dermatology to see if they can help more and placed at today's visit.

## 2014-05-10 NOTE — Telephone Encounter (Signed)
Patient is requesting a letter for her disability stating what her disability is.

## 2014-05-10 NOTE — Telephone Encounter (Signed)
Tried to reach patient to inform her that we have changed her lasix to hctz ( already sent in) and also her mammogram and colonoscopy have been ordered and she should be hearing from someone in the scheduling dept about that.

## 2014-05-10 NOTE — Telephone Encounter (Signed)
Patient called and i read her the below information. She is also asking for a referral to a dermatologist or something for her legs because of the itch. She requests to speak to you about this

## 2014-05-10 NOTE — Assessment & Plan Note (Signed)
No longer working with physical therapy and likely not moving much at home. She needs exercise to stay mobile and reminded her about that today.

## 2014-05-10 NOTE — Progress Notes (Signed)
   Subjective:    Patient ID: [name] [name], female    DOB: [birth year], 69 y.o.   MRN: [id number]  HPI Here for medicare wellness, 68 YO female with multiple medical problems (see A/P for status and plan for chronic conditions). Several new complaints including she lost the prescription for compression stockings from last visit and was not able to get that filled. She is still having same swelling, no fevers/chills/red rash. She saw orthopedics for her knees and they thought she needed to fix that issue first. She has been trying to diet and has lost about 10 pounds from last visit.   Diet: DM if diabetic Physical activity: sedentary Depression/mood screen: negative Hearing: intact to whispered voice Visual acuity: blurred, cataracts, has not been in 2 years ADLs: capable Fall risk: moderate, uses walker to decrease risk Home safety: good Cognitive evaluation: intact to orientation, naming, recall and repetition EOL planning: adv directives discussed no plans yet, full code/ I agree  I have personally reviewed and have noted 1. The patient's medical and social history 2. Their use of alcohol, tobacco or illicit drugs 3. Their current medications and supplements 4. The patient's functional ability including ADL's, fall risks, home safety risks and hearing or visual impairment. 5. Diet and physical activities 6. Evidence for depression or mood disorders 7.  Care team reviewed and updated  Review of Systems  Constitutional: Positive for activity change. Negative for fever, appetite change and fatigue.  HENT: Negative.   Eyes: Positive for visual disturbance.       Blurred vision  Respiratory: Negative for cough, chest tightness, shortness of breath and wheezing.   Cardiovascular: Positive for leg swelling. Negative for chest pain and palpitations.  Gastrointestinal: Negative for abdominal pain, diarrhea, constipation and abdominal distention.  Musculoskeletal: Positive for myalgias.    Skin: Positive for color change.       Chronic in her legs  Neurological: Negative for weakness, light-headedness and headaches.  Psychiatric/Behavioral: Negative.       Objective:   Physical Exam  Constitutional: She is oriented to person, place, and time. She appears well-developed and well-nourished.  obese  HENT:  Head: Normocephalic and atraumatic.  Eyes: EOM are normal.  Neck: Normal range of motion.  Cardiovascular: Normal rate and regular rhythm.   Pulmonary/Chest: Effort normal and breath sounds normal. No respiratory distress. She has no wheezes. She has no rales.  Abdominal: Soft. Bowel sounds are normal. She exhibits no distension. There is no tenderness. There is no rebound.  Musculoskeletal: She exhibits edema.  Neurological: She is alert and oriented to person, place, and time. Coordination abnormal.  Walks hunched over walker and fairly steady.  Skin:  Shins with thickened skin and open areas with stigmata of cracking and scratching. No signs of infection or cellulitis.   Vitals reviewed.  Filed Vitals:   05/10/14 0909  BP: 158/82  Pulse: 82  Temp: 97.6 F (36.4 C)  TempSrc: Oral  Resp: 18  Height: 5\' 11"  (1.803 m)  Weight: 317 lb 12.8 oz (144.153 kg)  SpO2: 95%      Assessment & Plan:

## 2014-05-10 NOTE — Assessment & Plan Note (Signed)
Check HgA1c and BMP today. Last HgA1c was well controlled and anticipate no change to regimen. She is on metformin and ARB. Encouraged her goal of weight loss and reminded her that she needs eye exam especially since she is having vision changes. Foot exam up to date.

## 2014-05-10 NOTE — Progress Notes (Signed)
Pre visit review using our clinic review tool, if applicable. No additional management support is needed unless otherwise documented below in the visit note. 

## 2014-05-10 NOTE — Patient Instructions (Signed)
We will give you another prescription for the stockings today and it will be important to wear them as often as possible. This will help to get fluid out of the legs and keep it from coming back. As we talked about since you have had the fluid in the legs for so long it may be difficult to get it all out.  Keep working with the diet and consider exercising if you are able. You need to lose more weight to take some pressure off the legs and the knees. Every 1 pound you lose takes 4 pounds of pressure off your knees.   You are overdue for a mammogram and colon cancer screening. You need to go back to the eye doctor to have them check for signs of diabetes in the eyes as well as for the cataracts.   Health Maintenance Adopting a healthy lifestyle and getting preventive care can go a long way to promote health and wellness. Talk with your health care provider about what schedule of regular examinations is right for you. This is a good chance for you to check in with your provider about disease prevention and staying healthy. In between checkups, there are plenty of things you can do on your own. Experts have done a lot of research about which lifestyle changes and preventive measures are most likely to keep you healthy. Ask your health care provider for more information. WEIGHT AND DIET  Eat a healthy diet  Be sure to include plenty of vegetables, fruits, low-fat dairy products, and lean protein.  Do not eat a lot of foods high in solid fats, added sugars, or salt.  Get regular exercise. This is one of the most important things you can do for your health.  Most adults should exercise for at least 150 minutes each week. The exercise should increase your heart rate and make you sweat (moderate-intensity exercise).  Most adults should also do strengthening exercises at least twice a week. This is in addition to the moderate-intensity exercise.  Maintain a healthy weight  Body mass index (BMI) is a  measurement that can be used to identify possible weight problems. It estimates body fat based on height and weight. Your health care provider can help determine your BMI and help you achieve or maintain a healthy weight.  For females 43 years of age and older:   A BMI below 18.5 is considered underweight.  A BMI of 18.5 to 24.9 is normal.  A BMI of 25 to 29.9 is considered overweight.  A BMI of 30 and above is considered obese.  Watch levels of cholesterol and blood lipids  You should start having your blood tested for lipids and cholesterol at 68 years of age, then have this test every 5 years.  You may need to have your cholesterol levels checked more often if:  Your lipid or cholesterol levels are high.  You are older than 68 years of age.  You are at high risk for heart disease.  CANCER SCREENING   Lung Cancer  Lung cancer screening is recommended for adults 42-17 years old who are at high risk for lung cancer because of a history of smoking.  A yearly low-dose CT scan of the lungs is recommended for people who:  Currently smoke.  Have quit within the past 15 years.  Have at least a 30-pack-year history of smoking. A pack year is smoking an average of one pack of cigarettes a day for 1 year.  Yearly screening  should continue until it has been 15 years since you quit.  Yearly screening should stop if you develop a health problem that would prevent you from having lung cancer treatment.  Breast Cancer  Practice breast self-awareness. This means understanding how your breasts normally appear and feel.  It also means doing regular breast self-exams. Let your health care provider know about any changes, no matter how small.  If you are in your 20s or 30s, you should have a clinical breast exam (CBE) by a health care provider every 1-3 years as part of a regular health exam.  If you are 41 or older, have a CBE every year. Also consider having a breast X-ray  (mammogram) every year.  If you have a family history of breast cancer, talk to your health care provider about genetic screening.  If you are at high risk for breast cancer, talk to your health care provider about having an MRI and a mammogram every year.  Breast cancer gene (BRCA) assessment is recommended for women who have family members with BRCA-related cancers. BRCA-related cancers include:  Breast.  Ovarian.  Tubal.  Peritoneal cancers.  Results of the assessment will determine the need for genetic counseling and BRCA1 and BRCA2 testing. Cervical Cancer Routine pelvic examinations to screen for cervical cancer are no longer recommended for nonpregnant women who are considered low risk for cancer of the pelvic organs (ovaries, uterus, and vagina) and who do not have symptoms. A pelvic examination may be necessary if you have symptoms including those associated with pelvic infections. Ask your health care provider if a screening pelvic exam is right for you.   The Pap test is the screening test for cervical cancer for women who are considered at risk.  If you had a hysterectomy for a problem that was not cancer or a condition that could lead to cancer, then you no longer need Pap tests.  If you are older than 65 years, and you have had normal Pap tests for the past 10 years, you no longer need to have Pap tests.  If you have had past treatment for cervical cancer or a condition that could lead to cancer, you need Pap tests and screening for cancer for at least 20 years after your treatment.  If you no longer get a Pap test, assess your risk factors if they change (such as having a new sexual partner). This can affect whether you should start being screened again.  Some women have medical problems that increase their chance of getting cervical cancer. If this is the case for you, your health care provider may recommend more frequent screening and Pap tests.  The human  papillomavirus (HPV) test is another test that may be used for cervical cancer screening. The HPV test looks for the virus that can cause cell changes in the cervix. The cells collected during the Pap test can be tested for HPV.  The HPV test can be used to screen women 61 years of age and older. Getting tested for HPV can extend the interval between normal Pap tests from three to five years.  An HPV test also should be used to screen women of any age who have unclear Pap test results.  After 68 years of age, women should have HPV testing as often as Pap tests.  Colorectal Cancer  This type of cancer can be detected and often prevented.  Routine colorectal cancer screening usually begins at 68 years of age and continues through 68  years of age.  Your health care provider may recommend screening at an earlier age if you have risk factors for colon cancer.  Your health care provider may also recommend using home test kits to check for hidden blood in the stool.  A small camera at the end of a tube can be used to examine your colon directly (sigmoidoscopy or colonoscopy). This is done to check for the earliest forms of colorectal cancer.  Routine screening usually begins at age 53.  Direct examination of the colon should be repeated every 5-10 years through 68 years of age. However, you may need to be screened more often if early forms of precancerous polyps or small growths are found. Skin Cancer  Check your skin from head to toe regularly.  Tell your health care provider about any new moles or changes in moles, especially if there is a change in a mole's shape or color.  Also tell your health care provider if you have a mole that is larger than the size of a pencil eraser.  Always use sunscreen. Apply sunscreen liberally and repeatedly throughout the day.  Protect yourself by wearing long sleeves, pants, a wide-brimmed hat, and sunglasses whenever you are outside. HEART DISEASE,  DIABETES, AND HIGH BLOOD PRESSURE   Have your blood pressure checked at least every 1-2 years. High blood pressure causes heart disease and increases the risk of stroke.  If you are between 61 years and 98 years old, ask your health care provider if you should take aspirin to prevent strokes.  Have regular diabetes screenings. This involves taking a blood sample to check your fasting blood sugar level.  If you are at a normal weight and have a low risk for diabetes, have this test once every three years after 68 years of age.  If you are overweight and have a high risk for diabetes, consider being tested at a younger age or more often. PREVENTING INFECTION  Hepatitis B  If you have a higher risk for hepatitis B, you should be screened for this virus. You are considered at high risk for hepatitis B if:  You were born in a country where hepatitis B is common. Ask your health care provider which countries are considered high risk.  Your parents were born in a high-risk country, and you have not been immunized against hepatitis B (hepatitis B vaccine).  You have HIV or AIDS.  You use needles to inject street drugs.  You live with someone who has hepatitis B.  You have had sex with someone who has hepatitis B.  You get hemodialysis treatment.  You take certain medicines for conditions, including cancer, organ transplantation, and autoimmune conditions. Hepatitis C  Blood testing is recommended for:  Everyone born from [birth year] through 1964.  Anyone with known risk factors for hepatitis C. Sexually transmitted infections (STIs)  You should be screened for sexually transmitted infections (STIs) including gonorrhea and chlamydia if:  You are sexually active and are younger than 68 years of age.  You are older than 68 years of age and your health care provider tells you that you are at risk for this type of infection.  Your sexual activity has changed since you were last screened and  you are at an increased risk for chlamydia or gonorrhea. Ask your health care provider if you are at risk.  If you do not have HIV, but are at risk, it may be recommended that you take a prescription medicine daily to prevent  HIV infection. This is called pre-exposure prophylaxis (PrEP). You are considered at risk if:  You are sexually active and do not regularly use condoms or know the HIV status of your partner(s).  You take drugs by injection.  You are sexually active with a partner who has HIV. Talk with your health care provider about whether you are at high risk of being infected with HIV. If you choose to begin PrEP, you should first be tested for HIV. You should then be tested every 3 months for as long as you are taking PrEP.  PREGNANCY   If you are premenopausal and you may become pregnant, ask your health care provider about preconception counseling.  If you may become pregnant, take 400 to 800 micrograms (mcg) of folic acid every day.  If you want to prevent pregnancy, talk to your health care provider about birth control (contraception). OSTEOPOROSIS AND MENOPAUSE   Osteoporosis is a disease in which the bones lose minerals and strength with aging. This can result in serious bone fractures. Your risk for osteoporosis can be identified using a bone density scan.  If you are 20 years of age or older, or if you are at risk for osteoporosis and fractures, ask your health care provider if you should be screened.  Ask your health care provider whether you should take a calcium or vitamin D supplement to lower your risk for osteoporosis.  Menopause may have certain physical symptoms and risks.  Hormone replacement therapy may reduce some of these symptoms and risks. Talk to your health care provider about whether hormone replacement therapy is right for you.  HOME CARE INSTRUCTIONS   Schedule regular health, dental, and eye exams.  Stay current with your immunizations.   Do  not use any tobacco products including cigarettes, chewing tobacco, or electronic cigarettes.  If you are pregnant, do not drink alcohol.  If you are breastfeeding, limit how much and how often you drink alcohol.  Limit alcohol intake to no more than 1 drink per day for nonpregnant women. One drink equals 12 ounces of beer, 5 ounces of wine, or 1 ounces of hard liquor.  Do not use street drugs.  Do not share needles.  Ask your health care provider for help if you need support or information about quitting drugs.  Tell your health care provider if you often feel depressed.  Tell your health care provider if you have ever been abused or do not feel safe at home. Document Released: 10/06/2010 Document Revised: 08/07/2013 Document Reviewed: 02/22/2013 [facility] Patient Information 2015 [place], [place]. This information is not intended to replace advice given to you by your health care provider. Make sure you discuss any questions you have with your health care provider.

## 2014-05-10 NOTE — Assessment & Plan Note (Signed)
Will change lasix 40 mg to hctz 12.5 mg and continue losartan. She was having problems with incontinence of urine due to not being able to get to the restroom quickly. Also BP higher than goal and HCTZ is more potent antihypertensive medication than lasix.

## 2014-05-10 NOTE — Assessment & Plan Note (Signed)
Referral placed for GI for colon cancer screening (has never had) as well as order for mammogram (not had in years). Lipid panel up to date. Shots up to date. Declines shingles shot.

## 2014-05-10 NOTE — Telephone Encounter (Signed)
For what disability? This likely would require a visit as she had multiple complaints at her physical.

## 2014-05-11 ENCOUNTER — Encounter: Payer: Self-pay | Admitting: Internal Medicine

## 2014-05-11 NOTE — Telephone Encounter (Signed)
The itching is from dry skin and she needs to be using moisturizer on it. Even vasoline. She needs to try the compression stockings to get rid of some of the fluid. She can take benadryl BID prn for itching over the counter. The best thing is to not itch.

## 2014-05-11 NOTE — Telephone Encounter (Signed)
Patient would like a referral to dermatology for the itching in her legs, but would also like you to prescribe something for the itching until she can get the appt with dermatology. Please advise, thanks.

## 2014-05-14 NOTE — Telephone Encounter (Signed)
Left message for patient to call me back. 

## 2014-05-17 ENCOUNTER — Encounter: Payer: Self-pay | Admitting: Internal Medicine

## 2014-05-17 ENCOUNTER — Encounter: Payer: Self-pay | Admitting: Geriatric Medicine

## 2014-05-17 ENCOUNTER — Ambulatory Visit (INDEPENDENT_AMBULATORY_CARE_PROVIDER_SITE_OTHER): Payer: Medicare Other | Admitting: Internal Medicine

## 2014-05-17 VITALS — BP 152/78 | HR 80 | Temp 97.9°F | Resp 20 | Ht 71.0 in | Wt 314.0 lb

## 2014-05-17 DIAGNOSIS — I878 Other specified disorders of veins: Secondary | ICD-10-CM

## 2014-05-17 NOTE — Progress Notes (Signed)
Pre visit review using our clinic review tool, if applicable. No additional management support is needed unless otherwise documented below in the visit note. 

## 2014-05-17 NOTE — Patient Instructions (Signed)
There are forms of benadryl in both gel and cream that you can use for the itching in your legs. The biggest thing that will help is to get the fluid out of the legs with the stockings.   We have looked for your form. Please give it one week for the copy to arrive at your house and if you do not receive it we may need to redo the form.   The diabetes is doing very well right now and we do not need to make any changes to your medicines.

## 2014-05-18 ENCOUNTER — Encounter: Payer: Self-pay | Admitting: Internal Medicine

## 2014-05-18 NOTE — Progress Notes (Signed)
   Subjective:    Patient ID: [name] [name], female    DOB: 09/13/1945, 69 y.o.   MRN: [id number]  HPI The patient is a 69 YO female who comes in today to follow up on her venous insufficiency. She is still having itching on her legs. She did get some lotion to start using that was on sale but it burns a little bit. She has not gotten the compression stockings which we talked about last week. She is having 3-4/10 pain when she scratches and itches her legs. She has not noticed any changes in her legs. She has still not been elevating her legs.   Review of Systems  Constitutional: Positive for activity change. Negative for fever, appetite change and fatigue.  HENT: Negative.   Eyes: Positive for visual disturbance.       Blurred vision  Respiratory: Negative for cough, chest tightness, shortness of breath and wheezing.   Cardiovascular: Positive for leg swelling. Negative for chest pain and palpitations.  Gastrointestinal: Negative for abdominal pain, diarrhea, constipation and abdominal distention.  Musculoskeletal: Positive for myalgias.  Skin: Positive for color change.       Chronic in her legs  Neurological: Negative for weakness, light-headedness and headaches.  Psychiatric/Behavioral: Negative.       Objective:   Physical Exam  Constitutional: She is oriented to person, place, and time. She appears well-developed and well-nourished.  obese  HENT:  Head: Normocephalic and atraumatic.  Eyes: EOM are normal.  Neck: Normal range of motion.  Cardiovascular: Normal rate and regular rhythm.   Pulmonary/Chest: Effort normal and breath sounds normal. No respiratory distress. She has no wheezes. She has no rales.  Abdominal: Soft. Bowel sounds are normal. She exhibits no distension. There is no tenderness. There is no rebound.  Musculoskeletal: She exhibits edema.  Neurological: She is alert and oriented to person, place, and time. Coordination abnormal.  Walks hunched over walker and  fairly steady.  Skin:  Shins with thickened skin and open areas with stigmata of cracking and scratching. No signs of infection or cellulitis.   Vitals reviewed.  Filed Vitals:   05/17/14 0939  BP: 152/78  Pulse: 80  Temp: 97.9 F (36.6 C)  TempSrc: Oral  Resp: 20  Height: 5\' 11"  (1.803 m)  Weight: 314 lb (142.429 kg)  SpO2: 95%      Assessment & Plan:

## 2014-05-18 NOTE — Assessment & Plan Note (Signed)
Spoke with patient again about the importance of getting the compression stockings as well as using moisturizing lotion several times daily. She is not to scratch as this will worsen the itching and lead to wounds on her legs. No signs of infection today or problems.

## 2014-05-28 ENCOUNTER — Encounter (INDEPENDENT_AMBULATORY_CARE_PROVIDER_SITE_OTHER): Payer: Self-pay

## 2014-05-28 ENCOUNTER — Ambulatory Visit
Admission: RE | Admit: 2014-05-28 | Discharge: 2014-05-28 | Disposition: A | Discharge status: Home / Self Care | Payer: Medicare Other | Source: Ambulatory Visit | Attending: Internal Medicine | Admitting: Internal Medicine

## 2014-05-28 DIAGNOSIS — Z1239 Encounter for other screening for malignant neoplasm of breast: Secondary | ICD-10-CM

## 2014-07-11 ENCOUNTER — Ambulatory Visit: Payer: Medicare Other | Admitting: Internal Medicine

## 2014-07-20 ENCOUNTER — Ambulatory Visit (AMBULATORY_SURGERY_CENTER): Payer: Self-pay | Admitting: *Deleted

## 2014-07-20 VITALS — Ht 71.0 in | Wt 308.8 lb

## 2014-07-20 DIAGNOSIS — Z1211 Encounter for screening for malignant neoplasm of colon: Secondary | ICD-10-CM

## 2014-07-20 NOTE — Progress Notes (Signed)
No eggs or soy allergy No diet pills No home 02 use No issues with past sedation

## 2014-07-24 ENCOUNTER — Ambulatory Visit (INDEPENDENT_AMBULATORY_CARE_PROVIDER_SITE_OTHER): Payer: Medicare Other | Admitting: Internal Medicine

## 2014-07-24 ENCOUNTER — Encounter: Payer: Self-pay | Admitting: Internal Medicine

## 2014-07-24 VITALS — BP 162/84 | HR 90 | Temp 98.4°F | Resp 18 | Wt 324.4 lb

## 2014-07-24 DIAGNOSIS — M170 Bilateral primary osteoarthritis of knee: Secondary | ICD-10-CM

## 2014-07-24 DIAGNOSIS — Z23 Encounter for immunization: Secondary | ICD-10-CM | POA: Diagnosis not present

## 2014-07-24 DIAGNOSIS — M1990 Unspecified osteoarthritis, unspecified site: Secondary | ICD-10-CM

## 2014-07-24 DIAGNOSIS — E1159 Type 2 diabetes mellitus with other circulatory complications: Secondary | ICD-10-CM

## 2014-07-24 DIAGNOSIS — E119 Type 2 diabetes mellitus without complications: Secondary | ICD-10-CM | POA: Diagnosis not present

## 2014-07-24 DIAGNOSIS — I10 Essential (primary) hypertension: Secondary | ICD-10-CM

## 2014-07-24 NOTE — Assessment & Plan Note (Signed)
Referral for orthopedic surgery for her knees which are a chronic issue for her and worse lately. Talked to her about the weight gain causing more pain and she should work on losing weight. Every 1 pound lost takes 4 pounds of pressure off her knees.

## 2014-07-24 NOTE — Assessment & Plan Note (Addendum)
HgA1c at goal, no need for glucometer, referral to podiatry to see if she qualifies for diabetic shoes. No neuropathy in her feet. Reminded about eye exam. Given prevnar to complete pneumonia series.

## 2014-07-24 NOTE — Assessment & Plan Note (Signed)
BP elevated today and she did not take her medicines today as she was coming to the doctor today. Last one at goal and will recheck at next visit and if still elevated will adjust.

## 2014-07-24 NOTE — Assessment & Plan Note (Signed)
Her weight is up today and she should be working on losing weight. Talked to her about the fact that her weight is causing some of her problems including diabetes, hypertension.

## 2014-07-24 NOTE — Progress Notes (Signed)
Pre visit review using our clinic review tool, if applicable. No additional management support is needed unless otherwise documented below in the visit note. 

## 2014-07-24 NOTE — Progress Notes (Signed)
   Subjective:    Patient ID: [name] [name], female    DOB: 25-Jun-1945, 69 y.o.   MRN: [id number]  HPI The patient is a 69 YO female who is coming in for follow up of her diabetes. She wants to know if she needs a glucometer and wants diabetic shoes. Denies numbness or tingling in her feet. She does not have good shoes right now. Denies any symptoms of high or low sugars. Has been taking her medication as prescribed. No side effects.   Review of Systems  Constitutional: Positive for activity change. Negative for fever, appetite change and fatigue.  HENT: Negative.   Respiratory: Negative for cough, chest tightness, shortness of breath and wheezing.   Cardiovascular: Positive for leg swelling. Negative for chest pain and palpitations.  Gastrointestinal: Negative for abdominal pain, diarrhea, constipation and abdominal distention.  Musculoskeletal: Positive for myalgias.  Skin: Positive for color change.       Chronic in her legs  Neurological: Negative for weakness, light-headedness and headaches.  Psychiatric/Behavioral: Negative.       Objective:   Physical Exam  Constitutional: She is oriented to person, place, and time. She appears well-developed and well-nourished.  obese  HENT:  Head: Normocephalic and atraumatic.  Eyes: EOM are normal.  Neck: Normal range of motion.  Cardiovascular: Normal rate and regular rhythm.   Pulmonary/Chest: Effort normal and breath sounds normal. No respiratory distress. She has no wheezes. She has no rales.  Abdominal: Soft. Bowel sounds are normal. She exhibits no distension. There is no tenderness. There is no rebound.  Musculoskeletal: She exhibits edema.  Neurological: She is alert and oriented to person, place, and time. Coordination abnormal.  Walks hunched over walker and fairly steady.  Vitals reviewed.  Filed Vitals:   07/24/14 0819  BP: 162/84  Pulse: 90  Temp: 98.4 F (36.9 C)  TempSrc: Oral  Resp: 18  Weight: 324 lb 6.4 oz  (147.147 kg)  SpO2: 95%      Assessment & Plan:  Prevnar 13 given at visit.

## 2014-07-24 NOTE — Patient Instructions (Signed)
You can try the zyrtec over the counter (or the store brand) for the allergies.   We will send you to the foot doctor for the diabetes shoes. We do not think that you need a glucose meter.   Come back in about 4 months to see how the diabetes is doing.   We have given you the pneumonia shot booster today which helps to protect you against pneumonia.

## 2014-07-31 ENCOUNTER — Encounter: Payer: Self-pay | Admitting: Internal Medicine

## 2014-07-31 ENCOUNTER — Ambulatory Visit (AMBULATORY_SURGERY_CENTER): Payer: Medicare Other | Admitting: Internal Medicine

## 2014-07-31 VITALS — BP 155/86 | HR 86 | Temp 98.2°F | Resp 18 | Ht 71.0 in | Wt 324.0 lb

## 2014-07-31 DIAGNOSIS — Z1211 Encounter for screening for malignant neoplasm of colon: Secondary | ICD-10-CM | POA: Diagnosis present

## 2014-07-31 LAB — GLUCOSE, CAPILLARY
Glucose-Capillary: 110 mg/dL — ABNORMAL HIGH (ref 70–99)
Glucose-Capillary: 99 mg/dL (ref 70–99)

## 2014-07-31 MED ORDER — FLEET ENEMA 7-19 GM/118ML RE ENEM
1.0000 | ENEMA | Freq: Once | RECTAL | Status: AC
  Administered 2014-07-31: 1 via RECTAL

## 2014-07-31 MED ORDER — SODIUM CHLORIDE 0.9 % IV SOLN: 500.0000 mL | INTRAVENOUS | Status: DC

## 2014-07-31 NOTE — Op Note (Addendum)
[place]  [address] [place], [address]   COLONOSCOPY PROCEDURE REPORT  PATIENT: [name], [name]  MR#: [id number] BIRTHDATE: 05/13/1945 , 65  yrs. old GENDER: female ENDOSCOPIST: Gatha Mayer, MD, [facility] PROCEDURE DATE:  07/31/2014 PROCEDURE:   Colonoscopy, screening First Screening Colonoscopy - Avg.  risk and is 50 yrs.  old or older Yes.  Prior Negative Screening - Now for repeat screening. N/A  History of Adenoma - Now for follow-up colonoscopy & has been > or = to 3 yrs.  N/A ASA CLASS:   Class III INDICATIONS:Screening for colonic neoplasia and Colorectal Neoplasm Risk Assessment for this procedure is average risk. MEDICATIONS: Propofol 480 mg IV and Monitored anesthesia care  DESCRIPTION OF PROCEDURE:   After the risks benefits and alternatives of the procedure were thoroughly explained, informed consent was obtained.  The digital rectal exam revealed no abnormalities of the rectum.   The [id number]  endoscope was introduced through the anus and advanced to the cecum, which was identified by both the appendix and ileocecal valve. No adverse events experienced.   The quality of the prep was adequate (MiraLax was used) (Fleets Enemas was used)  The instrument was then slowly withdrawn as the colon was fully examined.      COLON FINDINGS: A normal appearing cecum, ileocecal valve, and appendiceal orifice were identified.  The ascending, transverse, descending, sigmoid colon, and rectum appeared unremarkable. Retroflexed views revealed no abnormalities. The time to cecum = 5.0 Withdrawal time = 13.4   The scope was withdrawn and the procedure completed. COMPLICATIONS: There were no immediate complications.  ENDOSCOPIC IMPRESSION: Normal colonoscopy  RECOMMENDATIONS: 1.  Routine repeat colonoscopy screening not necessary.  See me/GI as needed. 2.  Would consider a fecal DNA test or other non-invasive options available in future in  10 years depending upon health status.  eSigned:  Gatha Mayer, MD, [facility] 08/02/2014 3:21 PM Revised: 08/02/2014 3:21 PM  cc: Jannette Fogo, MD and The Patient

## 2014-07-31 NOTE — Patient Instructions (Addendum)
   Colonoscopy was normal!  I appreciate the opportunity to care for you. Gatha Mayer, MD, FACG  YOU HAD AN ENDOSCOPIC PROCEDURE TODAY AT [facility]:   Refer to the procedure report that was given to you for any specific questions about what was found during the examination.  If the procedure report does not answer your questions, please call your gastroenterologist to clarify.  If you requested that your care partner not be given the details of your procedure findings, then the procedure report has been included in a sealed envelope for you to review at your convenience later.  YOU SHOULD EXPECT: Some feelings of bloating in the abdomen. Passage of more gas than usual.  Walking can help get rid of the air that was put into your GI tract during the procedure and reduce the bloating. If you had a lower endoscopy (such as a colonoscopy or flexible sigmoidoscopy) you may notice spotting of blood in your stool or on the toilet paper. If you underwent a bowel prep for your procedure, you may not have a normal bowel movement for a few days.  Please Note:  You might notice some irritation and congestion in your nose or some drainage.  This is from the oxygen used during your procedure.  There is no need for concern and it should clear up in a day or so.  SYMPTOMS TO REPORT IMMEDIATELY:   Following lower endoscopy (colonoscopy or flexible sigmoidoscopy):  Excessive amounts of blood in the stool  Significant tenderness or worsening of abdominal pains  Swelling of the abdomen that is new, acute  Fever of 100F or higher  For urgent or emergent issues, a gastroenterologist can be reached at any hour by calling [phone].  DIET: Your first meal following the procedure should be a small meal and then it is ok to progress to your normal diet. Heavy or fried foods are harder to digest and may make you feel nauseous or bloated.  Likewise, meals heavy in dairy and vegetables can  increase bloating.  Drink plenty of fluids but you should avoid alcoholic beverages for 24 hours.  ACTIVITY:  You should plan to take it easy for the rest of today and you should NOT DRIVE or use heavy machinery until tomorrow (because of the sedation medicines used during the test).    FOLLOW UP: Our staff will call the number listed on your records the next business day following your procedure to check on you and address any questions or concerns that you may have regarding the information given to you following your procedure. If we do not reach you, we will leave a message.  However, if you are feeling well and you are not experiencing any problems, there is no need to return our call.  We will assume that you have returned to your regular daily activities without incident.  SIGNATURES/CONFIDENTIALITY: You and/or your care partner have signed paperwork which will be entered into your electronic medical record.  These signatures attest to the fact that that the information above on your After Visit Summary has been reviewed and is understood.  Full responsibility of the confidentiality of this discharge information lies with you and/or your care-partner.  Continue your normal medications

## 2014-07-31 NOTE — Progress Notes (Signed)
To recovery, report to [facility], Therapist, sports. VSS

## 2014-08-01 ENCOUNTER — Telehealth: Payer: Self-pay | Admitting: *Deleted

## 2014-08-01 NOTE — Telephone Encounter (Signed)
  Follow up Call-  Call back number 07/31/2014  Post procedure Call Back phone  # [phone]  Permission to leave phone message Yes     Patient questions:  Do you have a fever, pain , or abdominal swelling? No. Pain Score  0 *  Have you tolerated food without any problems? Yes.    Have you been able to return to your normal activities? Yes.    Do you have any questions about your discharge instructions: Diet   No. Medications  No. Follow up visit  No.  Do you have questions or concerns about your Care? No.  Actions: * If pain score is 4 or above: No action needed, pain <4.

## 2014-08-13 ENCOUNTER — Encounter: Payer: Self-pay | Admitting: Podiatry

## 2014-08-13 ENCOUNTER — Ambulatory Visit (INDEPENDENT_AMBULATORY_CARE_PROVIDER_SITE_OTHER): Payer: Medicare Other | Admitting: Podiatry

## 2014-08-13 VITALS — BP 164/83 | HR 80 | Temp 98.1°F | Resp 16

## 2014-08-13 DIAGNOSIS — M79676 Pain in unspecified toe(s): Secondary | ICD-10-CM | POA: Diagnosis not present

## 2014-08-13 DIAGNOSIS — B351 Tinea unguium: Secondary | ICD-10-CM | POA: Diagnosis not present

## 2014-08-13 NOTE — Progress Notes (Signed)
   Subjective:    Patient ID: [name] [name], female    DOB: January 29, 1946, 69 y.o.   MRN: [id number]  HPI  N- painful and sore L- B/L toenails D- over a year O-slowly C-worse A-nothing T- grease palm, green can, did not help  "toenail trim and is requesting diabetic shoes"  She has a history of long care and compression therapy. She denies any history of foot ulceration or claudication  Review of Systems  Constitutional: Positive for chills.  HENT: Positive for sinus pressure and trouble swallowing.   Eyes: Positive for pain and itching.  Respiratory: Positive for cough.   Cardiovascular: Positive for leg swelling.  Gastrointestinal: Positive for constipation.  Genitourinary: Positive for urgency and frequency.  Musculoskeletal: Positive for back pain and gait problem.       Muscle pain  Skin:       Open sores  Allergic/Immunologic: Positive for environmental allergies.  Neurological: Positive for dizziness.  Hematological:       Slow to heal       Objective:   Physical Exam  Orientated 3  Vascular: Extremely lymphedema lower extremity bilaterally DP and PT pulses trace palpable bilaterally  Neurological: Ankle reflexes nonreactive bilaterally Vibratory sensation nonreactive bilaterally Sensation to 10 g monofilament wire intact 4/5 bilaterally  Dermatological: Lower extremities demonstrates scaling hyperkeratotic tissue with some superficial moist abrasion leg wounds on the lower extremities left lower leg The toenails are elongated, incurvated, discolored 6-10 and tender to direct palpation  Musculoskeletal: Patient walks fairly slowly with a walker assist HAV deformity right       Assessment & Plan:   Assessment: Significant lymphedema bilaterally with associated skin dermatitis Decrease pedal pulses may be associated with lymphedema difficulty palpating Diabetic peripheral neuropathy Gait disturbance Symptomatic onychomycoses  6-10  Plan: Reviewed the results with patient today Advised patient to contact either [facility] center or dermatologist for follow-up care for skin lesions associated with lymphedema Debridement of toenails 10 without any bleeding  Will reevaluate 3 months and will consider certification for diabetic shoes at next visit based on: Decrease pedal pulses bilaterally Decreased protective sensation HAV deformity

## 2014-08-13 NOTE — Patient Instructions (Signed)
Contact primary care physician or dermatologist to evaluate the dermatitis on lower legs associated with lymphedema  Diabetes and Foot Care Diabetes may cause you to have problems because of poor blood supply (circulation) to your feet and legs. This may cause the skin on your feet to become thinner, break easier, and heal more slowly. Your skin may become dry, and the skin may peel and crack. You may also have nerve damage in your legs and feet causing decreased feeling in them. You may not notice minor injuries to your feet that could lead to infections or more serious problems. Taking care of your feet is one of the most important things you can do for yourself.  HOME CARE INSTRUCTIONS  Wear shoes at all times, even in the house. Do not go barefoot. Bare feet are easily injured.  Check your feet daily for blisters, cuts, and redness. If you cannot see the bottom of your feet, use a mirror or ask someone for help.  Wash your feet with warm water (do not use hot water) and mild soap. Then pat your feet and the areas between your toes until they are completely dry. Do not soak your feet as this can dry your skin.  Apply a moisturizing lotion or petroleum jelly (that does not contain alcohol and is unscented) to the skin on your feet and to dry, brittle toenails. Do not apply lotion between your toes.  Trim your toenails straight across. Do not dig under them or around the cuticle. File the edges of your nails with an emery board or nail file.  Do not cut corns or calluses or try to remove them with medicine.  Wear clean socks or stockings every day. Make sure they are not too tight. Do not wear knee-high stockings since they may decrease blood flow to your legs.  Wear shoes that fit properly and have enough cushioning. To break in new shoes, wear them for just a few hours a day. This prevents you from injuring your feet. Always look in your shoes before you put them on to be sure there are no  objects inside.  Do not cross your legs. This may decrease the blood flow to your feet.  If you find a minor scrape, cut, or break in the skin on your feet, keep it and the skin around it clean and dry. These areas may be cleansed with mild soap and water. Do not cleanse the area with peroxide, alcohol, or iodine.  When you remove an adhesive bandage, be sure not to damage the skin around it.  If you have a wound, look at it several times a day to make sure it is healing.  Do not use heating pads or hot water bottles. They may burn your skin. If you have lost feeling in your feet or legs, you may not know it is happening until it is too late.  Make sure your health care provider performs a complete foot exam at least annually or more often if you have foot problems. Report any cuts, sores, or bruises to your health care provider immediately. SEEK MEDICAL CARE IF:   You have an injury that is not healing.  You have cuts or breaks in the skin.  You have an ingrown nail.  You notice redness on your legs or feet.  You feel burning or tingling in your legs or feet.  You have pain or cramps in your legs and feet.  Your legs or feet are numb.  Your feet always feel cold. SEEK IMMEDIATE MEDICAL CARE IF:   There is increasing redness, swelling, or pain in or around a wound.  There is a red line that goes up your leg.  Pus is coming from a wound.  You develop a fever or as directed by your health care provider.  You notice a bad smell coming from an ulcer or wound. Document Released: 03/20/2000 Document Revised: 11/23/2012 Document Reviewed: 08/30/2012 [facility] Patient Information 2015 [place], [place]. This information is not intended to replace advice given to you by your health care provider. Make sure you discuss any questions you have with your health care provider.

## 2014-08-20 ENCOUNTER — Other Ambulatory Visit: Payer: Self-pay | Admitting: *Deleted

## 2014-08-20 MED ORDER — METOPROLOL TARTRATE 100 MG PO TABS: 100.0000 mg | ORAL_TABLET | Freq: Two times a day (BID) | ORAL | Status: DC

## 2014-09-17 ENCOUNTER — Other Ambulatory Visit: Payer: Self-pay

## 2014-09-17 MED ORDER — METOPROLOL TARTRATE 100 MG PO TABS: 100.0000 mg | ORAL_TABLET | Freq: Two times a day (BID) | ORAL | Status: DC

## 2014-09-17 NOTE — Telephone Encounter (Signed)
Rx(s) sent to pharmacy electronically.  

## 2014-10-13 ENCOUNTER — Other Ambulatory Visit: Payer: Self-pay | Admitting: Internal Medicine

## 2014-11-12 ENCOUNTER — Other Ambulatory Visit: Payer: Self-pay | Admitting: *Deleted

## 2014-11-12 MED ORDER — OMEPRAZOLE 20 MG PO CPDR: 20.0000 mg | DELAYED_RELEASE_CAPSULE | Freq: Two times a day (BID) | ORAL | Status: DC

## 2014-11-13 ENCOUNTER — Telehealth: Payer: Self-pay

## 2014-11-13 NOTE — Telephone Encounter (Signed)
Received message from insurance prilosec denied. Stated she only takes as needed and will buy OTC prilosec.Advised when reviewing chart last office visit with Dr.Jordan 08/04/13.Stated she is doing ok no problems. Follow up appointment scheduled with Dr.Jordan 02/12/15 at 9:30 am at [facility] office.

## 2014-11-15 ENCOUNTER — Other Ambulatory Visit: Payer: Self-pay | Admitting: Internal Medicine

## 2014-11-21 ENCOUNTER — Ambulatory Visit (INDEPENDENT_AMBULATORY_CARE_PROVIDER_SITE_OTHER): Payer: Medicare Other | Admitting: Podiatry

## 2014-11-21 DIAGNOSIS — E0842 Diabetes mellitus due to underlying condition with diabetic polyneuropathy: Secondary | ICD-10-CM | POA: Diagnosis not present

## 2014-11-21 DIAGNOSIS — B351 Tinea unguium: Secondary | ICD-10-CM

## 2014-11-21 DIAGNOSIS — L84 Corns and callosities: Secondary | ICD-10-CM

## 2014-11-21 DIAGNOSIS — M79676 Pain in unspecified toe(s): Secondary | ICD-10-CM | POA: Diagnosis not present

## 2014-11-21 NOTE — Patient Instructions (Signed)
Diabetes and Foot Care Diabetes may cause you to have problems because of poor blood supply (circulation) to your feet and legs. This may cause the skin on your feet to become thinner, break easier, and heal more slowly. Your skin may become dry, and the skin may peel and crack. You may also have nerve damage in your legs and feet causing decreased feeling in them. You may not notice minor injuries to your feet that could lead to infections or more serious problems. Taking care of your feet is one of the most important things you can do for yourself.  HOME CARE INSTRUCTIONS  Wear shoes at all times, even in the house. Do not go barefoot. Bare feet are easily injured.  Check your feet daily for blisters, cuts, and redness. If you cannot see the bottom of your feet, use a mirror or ask someone for help.  Wash your feet with warm water (do not use hot water) and mild soap. Then pat your feet and the areas between your toes until they are completely dry. Do not soak your feet as this can dry your skin.  Apply a moisturizing lotion or petroleum jelly (that does not contain alcohol and is unscented) to the skin on your feet and to dry, brittle toenails. Do not apply lotion between your toes.  Trim your toenails straight across. Do not dig under them or around the cuticle. File the edges of your nails with an emery board or nail file.  Do not cut corns or calluses or try to remove them with medicine.  Wear clean socks or stockings every day. Make sure they are not too tight. Do not wear knee-high stockings since they may decrease blood flow to your legs.  Wear shoes that fit properly and have enough cushioning. To break in new shoes, wear them for just a few hours a day. This prevents you from injuring your feet. Always look in your shoes before you put them on to be sure there are no objects inside.  Do not cross your legs. This may decrease the blood flow to your feet.  If you find a minor scrape,  cut, or break in the skin on your feet, keep it and the skin around it clean and dry. These areas may be cleansed with mild soap and water. Do not cleanse the area with peroxide, alcohol, or iodine.  When you remove an adhesive bandage, be sure not to damage the skin around it.  If you have a wound, look at it several times a day to make sure it is healing.  Do not use heating pads or hot water bottles. They may burn your skin. If you have lost feeling in your feet or legs, you may not know it is happening until it is too late.  Make sure your health care provider performs a complete foot exam at least annually or more often if you have foot problems. Report any cuts, sores, or bruises to your health care provider immediately. SEEK MEDICAL CARE IF:   You have an injury that is not healing.  You have cuts or breaks in the skin.  You have an ingrown nail.  You notice redness on your legs or feet.  You feel burning or tingling in your legs or feet.  You have pain or cramps in your legs and feet.  Your legs or feet are numb.  Your feet always feel cold. SEEK IMMEDIATE MEDICAL CARE IF:   There is increasing redness,   swelling, or pain in or around a wound.  There is a red line that goes up your leg.  Pus is coming from a wound.  You develop a fever or as directed by your health care provider.  You notice a bad smell coming from an ulcer or wound. Document Released: 03/20/2000 Document Revised: 11/23/2012 Document Reviewed: 08/30/2012 [employer] Patient Information 2015 [employer]. This information is not intended to replace advice given to you by your health care provider. Make sure you discuss any questions you have with your health care provider.  

## 2014-11-22 NOTE — Progress Notes (Signed)
Patient ID: [name] [name], female   DOB: 1946/01/26, 69 y.o.   MRN: [id number]  Subjective: This patient presents for scheduled visit complaining of painful toenails walking wearing shoes and requests nail debridement. She is also complaining of a plantar callus on the left foot  Objective: Chronic lymphedema with Bandes wrap done lower extremity is bilaterally The toenails are elongated, brittle, incurvated, discolored and tender to palpation 6-10 Plantar keratoses left  Assessment: Chronic lymphedema bilaterally Diabetic peripheral neuropathy Symptomatic onychomycoses 6-10 Plantar keratoses 1  Plan: Debridement of toenails 10 mechanically an electrical without a bleeding Debrided plantar keratoses left without any bleeding  Reappoint at three-month intervals

## 2014-11-23 ENCOUNTER — Ambulatory Visit (INDEPENDENT_AMBULATORY_CARE_PROVIDER_SITE_OTHER): Payer: Medicare Other | Admitting: Internal Medicine

## 2014-11-23 ENCOUNTER — Other Ambulatory Visit (INDEPENDENT_AMBULATORY_CARE_PROVIDER_SITE_OTHER): Payer: Medicare Other

## 2014-11-23 ENCOUNTER — Encounter: Payer: Self-pay | Admitting: Internal Medicine

## 2014-11-23 VITALS — BP 158/84 | HR 83 | Temp 98.4°F | Resp 18 | Ht 71.0 in | Wt 324.0 lb

## 2014-11-23 DIAGNOSIS — N3946 Mixed incontinence: Secondary | ICD-10-CM

## 2014-11-23 DIAGNOSIS — E119 Type 2 diabetes mellitus without complications: Secondary | ICD-10-CM

## 2014-11-23 DIAGNOSIS — E669 Obesity, unspecified: Secondary | ICD-10-CM | POA: Diagnosis not present

## 2014-11-23 DIAGNOSIS — E1169 Type 2 diabetes mellitus with other specified complication: Secondary | ICD-10-CM

## 2014-11-23 DIAGNOSIS — E1159 Type 2 diabetes mellitus with other circulatory complications: Secondary | ICD-10-CM | POA: Diagnosis not present

## 2014-11-23 DIAGNOSIS — I10 Essential (primary) hypertension: Secondary | ICD-10-CM

## 2014-11-23 LAB — COMPREHENSIVE METABOLIC PANEL
ALT: 9 U/L (ref 0–35)
AST: 16 U/L (ref 0–37)
Albumin: 3.8 g/dL (ref 3.5–5.2)
Alkaline Phosphatase: 66 U/L (ref 39–117)
BUN: 12 mg/dL (ref 6–23)
CO2: 29 mEq/L (ref 19–32)
Calcium: 9.5 mg/dL (ref 8.4–10.5)
Chloride: 105 mEq/L (ref 96–112)
Creatinine, Ser: 0.75 mg/dL (ref 0.40–1.20)
GFR: 98.8 mL/min (ref >60.00)
Glucose, Bld: 105 mg/dL — ABNORMAL HIGH (ref 70–99)
Potassium: 3.7 mEq/L (ref 3.5–5.1)
Sodium: 141 mEq/L (ref 135–145)
Total Bilirubin: 0.4 mg/dL (ref 0.2–1.2)
Total Protein: 9.2 g/dL — ABNORMAL HIGH (ref 6.0–8.3)

## 2014-11-23 LAB — LIPID PANEL
Cholesterol: 144 mg/dL (ref 0–200)
HDL: 59.2 mg/dL (ref >39.00)
LDL Cholesterol: 75 mg/dL (ref 0–99)
NonHDL: 85.18
Total CHOL/HDL Ratio: 2
Triglycerides: 52 mg/dL (ref 0.0–149.0)
VLDL: 10.4 mg/dL (ref 0.0–40.0)

## 2014-11-23 LAB — HEMOGLOBIN A1C: Hgb A1c MFr Bld: 6.1 % (ref 4.6–6.5)

## 2014-11-23 MED ORDER — OXYBUTYNIN CHLORIDE 5 MG PO TABS: 5.0000 mg | ORAL_TABLET | Freq: Two times a day (BID) | ORAL | Status: DC

## 2014-11-23 MED ORDER — AMLODIPINE BESYLATE 10 MG PO TABS: 10.0000 mg | ORAL_TABLET | Freq: Every day | ORAL | Status: DC

## 2014-11-23 NOTE — Assessment & Plan Note (Addendum)
Checking HgA1c and lipid panel today. She is currently on metformin and well controlled. Last HgA1c 6.2. Complicated by stable circulatory changes in her legs. On ARB.

## 2014-11-23 NOTE — Progress Notes (Signed)
Pre visit review using our clinic review tool, if applicable. No additional management support is needed unless otherwise documented below in the visit note. 

## 2014-11-23 NOTE — Assessment & Plan Note (Signed)
BP is moderately elevated due to stopping her HCTZ. Will D/C and replace with amlodipine 10 mg daily. Return for recheck. Checking CMP today.

## 2014-11-23 NOTE — Progress Notes (Signed)
   Subjective:    Patient ID: [name] [name], female    DOB: October 14, 1945, 69 y.o.   MRN: [id number]  HPI The patient is a 69 YO female coming in for urinary incontinence. She has suffered with this for some time but it has worsened in the last 3 months. She has stopped taking her HCTZ due to it making her have accidents. She is still struggling with going frequently and with little notice. Denies dysuria or color change to urine. No fevers or chills. No abdominal or flank pain. She has never tried medicine for it in the past. She has tried kegel exercises without much success. Denies headaches, chest pains, SOB, nausea or vomiting.  Review of Systems  Constitutional: Positive for activity change. Negative for fever, appetite change and fatigue.  Respiratory: Negative for cough, chest tightness, shortness of breath and wheezing.   Cardiovascular: Positive for leg swelling. Negative for chest pain and palpitations.  Gastrointestinal: Negative for abdominal pain, diarrhea, constipation and abdominal distention.  Genitourinary: Positive for urgency and frequency.  Musculoskeletal: Positive for myalgias.  Skin: Positive for color change.       Chronic in her legs  Neurological: Negative for weakness, light-headedness and headaches.      Objective:   Physical Exam  Constitutional: She is oriented to person, place, and time. She appears well-developed and well-nourished.  obese  HENT:  Head: Normocephalic and atraumatic.  Eyes: EOM are normal.  Neck: Normal range of motion.  Cardiovascular: Normal rate and regular rhythm.   Pulmonary/Chest: Effort normal and breath sounds normal. No respiratory distress. She has no wheezes. She has no rales.  Abdominal: Soft. Bowel sounds are normal. She exhibits no distension. There is no tenderness. There is no rebound.  Musculoskeletal: She exhibits edema.  Neurological: She is alert and oriented to person, place, and time. Coordination abnormal.  Walks  hunched over walker and fairly steady.  Vitals reviewed.  Filed Vitals:   11/23/14 0805 11/23/14 0844  BP: 160/80 158/84  Pulse: 83   Temp: 98.4 F (36.9 C)   TempSrc: Oral   Resp: 18   Height: 5\' 11"  (1.803 m)   Weight: 324 lb (146.965 kg)   SpO2: 97%       Assessment & Plan:

## 2014-11-23 NOTE — Assessment & Plan Note (Signed)
She has stopped HCTZ on her own, will try oxybutynin 5 mg BID for her to see if this improves her symptoms. She has tried and failed Kegel exercises.

## 2014-11-23 NOTE — Patient Instructions (Addendum)
It is okay to stay off the hydrochlorothiazide (HCTZ). We will send in a medicine called amlodipine which is a once a day medicine that helps with blood pressure.   We have also sent in a medicine for the bladder called oxybutynin. It is a medicine that you can take up to twice a day to help the bladder to hold urine better and should help with your symptoms. The most common side effects are dry mouth and constipation so watch out for that.   We are going to check on your labs today to make sure you are not getting dehydrated and to make sure the diabetes is still doing well.

## 2014-11-28 ENCOUNTER — Telehealth: Payer: Self-pay | Admitting: Internal Medicine

## 2014-11-28 DIAGNOSIS — R2681 Unsteadiness on feet: Secondary | ICD-10-CM

## 2014-11-28 NOTE — Telephone Encounter (Signed)
Patient need a prescription for a walker, and the results of her lab work. please advise

## 2014-11-29 NOTE — Telephone Encounter (Signed)
Would not recommend wheelchair as she is likely to lose mobility if she stops walking. Does she need new walker? Would need visit to discuss if she wants a wheelchair.

## 2014-11-29 NOTE — Telephone Encounter (Signed)
Spoke with patient about lab results. She just needs a prescription for a wheelchair.

## 2014-11-30 NOTE — Telephone Encounter (Signed)
Left message for patient to call me back. 

## 2014-11-30 NOTE — Telephone Encounter (Signed)
Patient returned your call.

## 2014-11-30 NOTE — Telephone Encounter (Signed)
Returned patients call.

## 2014-12-06 NOTE — Telephone Encounter (Signed)
Patient returning your call about needing a new walker She can be reached at [phone]

## 2014-12-07 NOTE — Telephone Encounter (Signed)
Printed and signed.  

## 2014-12-07 NOTE — Telephone Encounter (Signed)
Patient will come pick up Tuesday.

## 2014-12-07 NOTE — Addendum Note (Signed)
Addended by: Vertell Novak A on: 12/07/2014 12:51 PM   Modules accepted: Orders

## 2014-12-07 NOTE — Telephone Encounter (Signed)
Patient just wants a basic silver walker, but it needs to be in extra tall.

## 2014-12-07 NOTE — Telephone Encounter (Signed)
Patient is calling to follow up. She states that she will be available all day today to take your call.

## 2015-01-02 ENCOUNTER — Other Ambulatory Visit: Payer: Self-pay | Admitting: Cardiology

## 2015-01-14 ENCOUNTER — Other Ambulatory Visit: Payer: Self-pay | Admitting: Cardiology

## 2015-01-14 MED ORDER — OMEPRAZOLE 20 MG PO CPDR: 20.0000 mg | DELAYED_RELEASE_CAPSULE | Freq: Two times a day (BID) | ORAL | Status: DC

## 2015-02-12 ENCOUNTER — Ambulatory Visit (INDEPENDENT_AMBULATORY_CARE_PROVIDER_SITE_OTHER): Payer: Medicare Other | Admitting: Cardiology

## 2015-02-12 ENCOUNTER — Encounter: Payer: Self-pay | Admitting: Cardiology

## 2015-02-12 VITALS — BP 130/82 | HR 71 | Ht 71.0 in | Wt 318.2 lb

## 2015-02-12 DIAGNOSIS — I471 Supraventricular tachycardia: Secondary | ICD-10-CM

## 2015-02-12 DIAGNOSIS — I878 Other specified disorders of veins: Secondary | ICD-10-CM

## 2015-02-12 DIAGNOSIS — I10 Essential (primary) hypertension: Secondary | ICD-10-CM

## 2015-02-12 MED ORDER — METOPROLOL TARTRATE 50 MG PO TABS: 50.0000 mg | ORAL_TABLET | Freq: Two times a day (BID) | ORAL | Status: DC

## 2015-02-12 NOTE — Patient Instructions (Signed)
We will reduce metoprolol to 50 mg twice a day  Continue your other therapy  I will see you back as needed.

## 2015-02-12 NOTE — Progress Notes (Signed)
[name] [name] Date of Birth: 1945/09/04 Medical Record #[id number]  History of Present Illness: [name] [name] is seen back today for a follow up visit. She has a history of SVT with HR up to 170s. She is on chronic metoprolol.  She is morbidly obese, has chronic venous insufficiency and edema, HTN and chronic pain. She is s/p ablation for AVNRT by Dr. Lovena [name] in August 2015. No recurrent tachycardia since then. On follow up today she has no cardiac complaints. She has chronic swelling in her legs due to venous insufficiency. She is followed at the wound center.   Current Outpatient Prescriptions on File Prior to Visit  Medication Sig Dispense Refill  . acetaminophen (TYLENOL) 325 MG tablet Take 2 tablets (650 mg total) by mouth every 6 (six) hours as needed for mild pain (or Fever >/= 101).    Marland Kitchen amLODipine (NORVASC) 10 MG tablet Take 1 tablet (10 mg total) by mouth daily. 90 tablet 3  . aspirin EC 81 MG tablet Take 81 mg by mouth daily with breakfast. Takes once or twice weekly    . ibuprofen (ADVIL,MOTRIN) 600 MG tablet Take 1 tablet (600 mg total) by mouth every 8 (eight) hours as needed. 15 tablet 0  . losartan (COZAAR) 100 MG tablet TAKE 1 TABLET BY MOUTH EVERY DAY 90 tablet 1  . metFORMIN (GLUCOPHAGE) 500 MG tablet TAKE 1 TABLET BY MOUTH TWICE A DAY 180 tablet 1  . methocarbamol (ROBAXIN) 500 MG tablet Take 1 tablet (500 mg total) by mouth every 6 (six) hours as needed for muscle spasms. 30 tablet 0  . omeprazole (PRILOSEC) 20 MG capsule Take 1 capsule (20 mg total) by mouth 2 (two) times daily before a meal. Patient takes as needed 60 capsule 1   No current facility-administered medications on file prior to visit.    Allergies  Allergen Reactions  . Oxycodone Hcl Nausea And Vomiting  . Penicillins Itching    Past Medical History  Diagnosis Date  . Edema   . Palpitations   . History of diastolic dysfunction     Echo 07/979 with diastolic dysfunction  . PAC (premature atrial  contraction)     per prior Holter  . PVC's (premature ventricular contractions)     per prior Holter  . Morbid obesity ([place])   . HTN (hypertension)   . Tachycardia     noted at 07/28/11 visit. Started on beta blocker. Possible atrial flutter vs long PR tachycardia/AVNRT  . SVT (supraventricular tachycardia) ([place])   . Aortic stenosis, mild 11/17/2013  . CHF (congestive heart failure) ([place])   . Acute kidney injury ([place])   . Heart murmur   . Pneumonia     "as a child"  . Type II diabetes mellitus ([place])   . GERD (gastroesophageal reflux disease)   . Arthritis     "both knees, spine, back, fingers" (11/29/2013)  . OA (osteoarthritis)     Past Surgical History  Procedure Laterality Date  . Cesarean section  1985  . US echocardiography  07/25/2008    EF 55-60%  . Supraventricular tachycardia ablation  11/29/2013  . Supraventricular tachycardia ablation N/A 11/29/2013    Procedure: SUPRAVENTRICULAR TACHYCARDIA ABLATION;  Surgeon: [name] Lance, MD;  Location: [facility] CATH LAB;  Service: Cardiovascular;  Laterality: N/A;  . Vaginal delivery    . Cesarean section  1985    History  Smoking status  . Former Smoker -- 2.00 packs/day for 15 years  . Types: Cigarettes  . Quit  date: 11/23/1985  Smokeless tobacco  . Never Used    History  Alcohol Use No    Family History  Problem Relation Age of Onset  . Alzheimer's disease Mother   . Lung cancer Mother   . COPD Father   . Diabetes Maternal Uncle   . Stroke Neg Hx   . Colon cancer Neg Hx     Review of Systems: The review of systems is per the HPI.  All other systems were reviewed and are negative.  Physical Exam: BP 130/82 mmHg  Pulse 71  Ht 5\' 11"  (1.803 m)  Wt 144.329 kg (318 lb 3 oz)  BMI 44.40 kg/m2 Patient is very pleasant and in no acute distress. She is morbidly obese. She has difficulty ambulating.  Skin is warm and dry. Color is normal.  HEENT is unremarkable. Normocephalic/atraumatic. PERRL. Sclera are nonicteric.  Neck is supple. No masses. No JVD. Lungs are fairly clear. Cardiac exam shows a regular rate and rhythm.  there is a soft outflow murmur.  [place]domen is morbidly obese but soft. Extremities are quite full and with 3+edema and weeping which is chronic. Gait and ROM are intact. No gross neurologic deficits noted.  LABORATORY DATA:  Lab Results  Component Value Date   WBC 8.2 12/09/2013   HGB 9.2* 12/09/2013   HCT 28.2* 12/09/2013   PLT 246 12/09/2013   GLUCOSE 105* 11/23/2014   CHOL 144 11/23/2014   TRIG 52.0 11/23/2014   HDL 59.20 11/23/2014   LDLCALC 75 11/23/2014   ALT 9 11/23/2014   AST 16 11/23/2014   NA 141 11/23/2014   K 3.7 11/23/2014   CL 105 11/23/2014   CREATININE 0.75 11/23/2014   BUN 12 11/23/2014   CO2 29 11/23/2014   TSH 3.93 07/28/2011   HGBA1C 6.1 11/23/2014   Echo Study Conclusions from 2013 - Left ventricle: The cavity size was mildly dilated. Systolic function was normal. The estimated ejection fraction was in the range of 60% to 65%. - Aortic valve: There was very mild stenosis.  Ecg today shows NSR with LVH. Otherwise normal. I have personally reviewed and interpreted this study.   Assessment / Plan: 1. HTN -  blood pressure well controlled on current therapy.  2. SVT - s/p ablation for AVRNT without recurrence. Will reduce metoprolol to 50 mg bid. If she continues to have good BP control this could be weaned further.   3. Swelling - chronic -  related to severe venous insufficiency.  4.  Morbid obesity. Patient has deferred sleep study in the past.  I will follow up as needed.

## 2015-02-20 ENCOUNTER — Ambulatory Visit: Payer: Medicare Other | Admitting: Podiatry

## 2015-03-05 ENCOUNTER — Ambulatory Visit (INDEPENDENT_AMBULATORY_CARE_PROVIDER_SITE_OTHER): Payer: Medicare Other | Admitting: Internal Medicine

## 2015-03-05 ENCOUNTER — Other Ambulatory Visit (INDEPENDENT_AMBULATORY_CARE_PROVIDER_SITE_OTHER): Payer: Medicare Other

## 2015-03-05 ENCOUNTER — Encounter: Payer: Self-pay | Admitting: Internal Medicine

## 2015-03-05 VITALS — BP 158/80 | HR 74 | Temp 98.3°F | Resp 18 | Ht 71.0 in | Wt 321.0 lb

## 2015-03-05 DIAGNOSIS — I10 Essential (primary) hypertension: Secondary | ICD-10-CM | POA: Diagnosis not present

## 2015-03-05 DIAGNOSIS — I878 Other specified disorders of veins: Secondary | ICD-10-CM | POA: Diagnosis not present

## 2015-03-05 DIAGNOSIS — Z23 Encounter for immunization: Secondary | ICD-10-CM | POA: Diagnosis not present

## 2015-03-05 DIAGNOSIS — E119 Type 2 diabetes mellitus without complications: Secondary | ICD-10-CM | POA: Diagnosis not present

## 2015-03-05 LAB — HEMOGLOBIN A1C: Hgb A1c MFr Bld: 6.3 % (ref 4.6–6.5)

## 2015-03-05 MED ORDER — TRIPLE ANTIBIOTIC 5-400-5000 EX OINT
TOPICAL_OINTMENT | CUTANEOUS | Status: DC
Start: 2015-03-05 — End: 2016-11-23

## 2015-03-05 NOTE — Patient Instructions (Signed)
We will check the diabetes today and if it is still as good we can reduce the metformin to once a day (we will call you).   We have sent in the antibiotic cream to the pharmacy for your legs that you can use before you wrap.   If you want to go back to the wound center let us know and we will arrange it.   Diabetes and Exercise Exercising regularly is important. It is not just about losing weight. It has many health benefits, such as:  Improving your overall fitness, flexibility, and endurance.  Increasing your bone density.  Helping with weight control.  Decreasing your body fat.  Increasing your muscle strength.  Reducing stress and tension.  Improving your overall health. People with diabetes who exercise gain additional benefits because exercise:  Reduces appetite.  Improves the body's use of blood sugar (glucose).  Helps lower or control blood glucose.  Decreases blood pressure.  Helps control blood lipids (such as cholesterol and triglycerides).  Improves the body's use of the hormone insulin by:  Increasing the body's insulin sensitivity.  Reducing the body's insulin needs.  Decreases the risk for heart disease because exercising:  Lowers cholesterol and triglycerides levels.  Increases the levels of good cholesterol (such as high-density lipoproteins [HDL]) in the body.  Lowers blood glucose levels. YOUR ACTIVITY PLAN  Choose an activity that you enjoy, and set realistic goals. To exercise safely, you should begin practicing any new physical activity slowly, and gradually increase the intensity of the exercise over time. Your health care provider or diabetes educator can help create an activity plan that works for you. General recommendations include:  Encouraging children to engage in at least 60 minutes of physical activity each day.  Stretching and performing strength training exercises, such as yoga or weight lifting, at least 2 times per  week.  Performing a total of at least 150 minutes of moderate-intensity exercise each week, such as brisk walking or water aerobics.  Exercising at least 3 days per week, making sure you allow no more than 2 consecutive days to pass without exercising.  Avoiding long periods of inactivity (90 minutes or more). When you have to spend an extended period of time sitting down, take frequent breaks to walk or stretch. RECOMMENDATIONS FOR EXERCISING WITH TYPE 1 OR TYPE 2 DIABETES   Check your blood glucose before exercising. If blood glucose levels are greater than 240 mg/dL, check for urine ketones. Do not exercise if ketones are present.  Avoid injecting insulin into areas of the body that are going to be exercised. For example, avoid injecting insulin into:  The arms when playing tennis.  The legs when jogging.  Keep a record of:  Food intake before and after you exercise.  Expected peak times of insulin action.  Blood glucose levels before and after you exercise.  The type and amount of exercise you have done.  Review your records with your health care provider. Your health care provider will help you to develop guidelines for adjusting food intake and insulin amounts before and after exercising.  If you take insulin or oral hypoglycemic agents, watch for signs and symptoms of hypoglycemia. They include:  Dizziness.  Shaking.  Sweating.  Chills.  Confusion.  Drink plenty of water while you exercise to prevent dehydration or heat stroke. Body water is lost during exercise and must be replaced.  Talk to your health care provider before starting an exercise program to make sure it is  safe for you. Remember, almost any type of activity is better than none.   This information is not intended to replace advice given to you by your health care provider. Make sure you discuss any questions you have with your health care provider.   Document Released: 06/13/2003 Document Revised:  08/07/2014 Document Reviewed: 08/30/2012 Elsevier Interactive Patient Education [employer].

## 2015-03-05 NOTE — Assessment & Plan Note (Signed)
Legs are more swollen today as she has not been elevating (she has been moving). Advised her to keep wrapping and keep clean and to elevate as much as possible. Avoid salty foods. Offered her to return to wound center for lymphedema clinic and she will think about that. She did not want to unwrap today but denies ulcerations. Seeping fluid from her legs clear.

## 2015-03-05 NOTE — Assessment & Plan Note (Signed)
Checking HgA1c, if still <6.5 will decrease metformin to 500 mg daily. On ARB. Foot exam done today. Reminded about yearly eye exam. No known complications. Controlled.

## 2015-03-05 NOTE — Assessment & Plan Note (Addendum)
BP elevated mildly (recent metoprolol reduction) but more normal on recheck. Continue losartan, amlodipine, metoprolol. No need for BMP today as done last visit. No complications noted.

## 2015-03-05 NOTE — Progress Notes (Signed)
Pre visit review using our clinic review tool, if applicable. No additional management support is needed unless otherwise documented below in the visit note. 

## 2015-03-05 NOTE — Progress Notes (Signed)
   Subjective:    Patient ID: [name] [name], female    DOB: [birth year], 69 y.o.   MRN: [id number]  HPI The patient is a 69 YO female coming in for follow up of her diabetes. She is still taking her metformin. Denies any hypoglycemia. No numbness or sores on her feet. She is having worsening trouble with her lymphedema in her legs. They are draining out clear fluid and smelling some. There may be a sore but she is not sure. She has just wrapped them and does not want to unwrap now. Has gone to the wound clinic for lymphedema before with good success several years ago.   Review of Systems  Constitutional: Negative for fever, appetite change and fatigue.  HENT: Negative.   Respiratory: Negative for cough, chest tightness, shortness of breath and wheezing.   Cardiovascular: Positive for leg swelling. Negative for chest pain and palpitations.  Gastrointestinal: Negative for abdominal pain, diarrhea, constipation and abdominal distention.  Genitourinary: Positive for urgency and frequency.  Skin: Positive for color change.       Chronic in her legs  Neurological: Negative for weakness, light-headedness and headaches.      Objective:   Physical Exam  Constitutional: She is oriented to person, place, and time. She appears well-developed and well-nourished.  obese  HENT:  Head: Normocephalic and atraumatic.  Eyes: EOM are normal.  Neck: Normal range of motion.  Cardiovascular: Normal rate and regular rhythm.   No murmur heard. Pulmonary/Chest: Effort normal and breath sounds normal. No respiratory distress. She has no wheezes. She has no rales.  Abdominal: Soft. Bowel sounds are normal. She exhibits no distension. There is no tenderness. There is no rebound.  Musculoskeletal: She exhibits edema.  Legs slightly larger than normal, both equal, wrapped and she does not wish to unwrap  Neurological: She is alert and oriented to person, place, and time. Coordination abnormal.  Walks hunched over  walker and fairly steady.  Vitals reviewed.  Filed Vitals:   03/05/15 0806  BP: 154/80  Pulse: 74  Temp: 98.3 F (36.8 C)  TempSrc: Oral  Resp: 18  Height: 5\' 11"  (1.803 m)  Weight: 321 lb (145.605 kg)  SpO2: 98%      Assessment & Plan:  Flu shot given at visit.

## 2015-03-05 NOTE — Assessment & Plan Note (Signed)
Right now weight is up from leg swelling. Advised her to work on elevating to decrease. This will help her to be more functional and add more exercise to help with her weight.

## 2015-03-08 ENCOUNTER — Telehealth: Payer: Self-pay | Admitting: Internal Medicine

## 2015-03-08 ENCOUNTER — Other Ambulatory Visit: Payer: Self-pay | Admitting: Internal Medicine

## 2015-03-08 DIAGNOSIS — I83009 Varicose veins of unspecified lower extremity with ulcer of unspecified site: Secondary | ICD-10-CM

## 2015-03-08 DIAGNOSIS — L97909 Non-pressure chronic ulcer of unspecified part of unspecified lower leg with unspecified severity: Principal | ICD-10-CM

## 2015-03-08 DIAGNOSIS — I83899 Varicose veins of unspecified lower extremities with other complications: Principal | ICD-10-CM

## 2015-03-08 DIAGNOSIS — R609 Edema, unspecified: Principal | ICD-10-CM

## 2015-03-08 NOTE — Telephone Encounter (Signed)
Patient aware and will wait to hear from someone with an appt.

## 2015-03-08 NOTE — Telephone Encounter (Signed)
Pt called in and said that she has an ulser on her leg that is draining and smelling.  She wants to know Dr [name] could put a referral on to the wound care center?     Best number [phone]

## 2015-03-08 NOTE — Telephone Encounter (Signed)
Placed.

## 2015-03-20 ENCOUNTER — Telehealth: Payer: Self-pay | Admitting: Internal Medicine

## 2015-03-20 NOTE — Telephone Encounter (Signed)
Keep the legs elevated to help keep some of the swelling down. Keep using the cream, if she is concerned about infection she can come here for an acute visit sooner.

## 2015-03-20 NOTE — Telephone Encounter (Signed)
I spoke with patient and she will elevate her legs and schedule a visit if she thinks she has an infection.

## 2015-03-20 NOTE — Telephone Encounter (Signed)
States her appointment with Wound Care is not until 1/3.  States that Dr. [name] had given her an ointment to go on her wound but states her wound still has a smell to it, even after she cleans it.  Would like to know what else Dr. [name] suggest to do until she can get in with Wound Care.

## 2015-04-09 ENCOUNTER — Encounter (HOSPITAL_BASED_OUTPATIENT_CLINIC_OR_DEPARTMENT_OTHER): Payer: PPO | Attending: Internal Medicine

## 2015-04-09 DIAGNOSIS — E119 Type 2 diabetes mellitus without complications: Secondary | ICD-10-CM | POA: Insufficient documentation

## 2015-04-09 DIAGNOSIS — I10 Essential (primary) hypertension: Secondary | ICD-10-CM | POA: Insufficient documentation

## 2015-04-09 DIAGNOSIS — I890 Lymphedema, not elsewhere classified: Secondary | ICD-10-CM | POA: Insufficient documentation

## 2015-04-09 DIAGNOSIS — M1990 Unspecified osteoarthritis, unspecified site: Secondary | ICD-10-CM | POA: Diagnosis not present

## 2015-04-09 DIAGNOSIS — L97821 Non-pressure chronic ulcer of other part of left lower leg limited to breakdown of skin: Secondary | ICD-10-CM | POA: Diagnosis not present

## 2015-04-09 DIAGNOSIS — L97811 Non-pressure chronic ulcer of other part of right lower leg limited to breakdown of skin: Secondary | ICD-10-CM | POA: Diagnosis not present

## 2015-04-09 DIAGNOSIS — I872 Venous insufficiency (chronic) (peripheral): Secondary | ICD-10-CM | POA: Diagnosis not present

## 2015-04-09 DIAGNOSIS — I87333 Chronic venous hypertension (idiopathic) with ulcer and inflammation of bilateral lower extremity: Secondary | ICD-10-CM | POA: Diagnosis not present

## 2015-04-16 DIAGNOSIS — I87333 Chronic venous hypertension (idiopathic) with ulcer and inflammation of bilateral lower extremity: Secondary | ICD-10-CM | POA: Diagnosis not present

## 2015-04-16 DIAGNOSIS — I890 Lymphedema, not elsewhere classified: Secondary | ICD-10-CM | POA: Diagnosis not present

## 2015-04-23 DIAGNOSIS — I87333 Chronic venous hypertension (idiopathic) with ulcer and inflammation of bilateral lower extremity: Secondary | ICD-10-CM | POA: Diagnosis not present

## 2015-04-24 ENCOUNTER — Encounter: Payer: Self-pay | Admitting: Podiatry

## 2015-04-24 ENCOUNTER — Ambulatory Visit (INDEPENDENT_AMBULATORY_CARE_PROVIDER_SITE_OTHER): Payer: PPO | Admitting: Podiatry

## 2015-04-24 DIAGNOSIS — B351 Tinea unguium: Secondary | ICD-10-CM | POA: Diagnosis not present

## 2015-04-24 DIAGNOSIS — M79676 Pain in unspecified toe(s): Secondary | ICD-10-CM | POA: Diagnosis not present

## 2015-04-24 NOTE — Patient Instructions (Signed)
Diabetes and Foot Care Diabetes may cause you to have problems because of poor blood supply (circulation) to your feet and legs. This may cause the skin on your feet to become thinner, break easier, and heal more slowly. Your skin may become dry, and the skin may peel and crack. You may also have nerve damage in your legs and feet causing decreased feeling in them. You may not notice minor injuries to your feet that could lead to infections or more serious problems. Taking care of your feet is one of the most important things you can do for yourself.  HOME CARE INSTRUCTIONS  Wear shoes at all times, even in the house. Do not go barefoot. Bare feet are easily injured.  Check your feet daily for blisters, cuts, and redness. If you cannot see the bottom of your feet, use a mirror or ask someone for help.  Wash your feet with warm water (do not use hot water) and mild soap. Then pat your feet and the areas between your toes until they are completely dry. Do not soak your feet as this can dry your skin.  Apply a moisturizing lotion or petroleum jelly (that does not contain alcohol and is unscented) to the skin on your feet and to dry, brittle toenails. Do not apply lotion between your toes.  Trim your toenails straight across. Do not dig under them or around the cuticle. File the edges of your nails with an emery board or nail file.  Do not cut corns or calluses or try to remove them with medicine.  Wear clean socks or stockings every day. Make sure they are not too tight. Do not wear knee-high stockings since they may decrease blood flow to your legs.  Wear shoes that fit properly and have enough cushioning. To break in new shoes, wear them for just a few hours a day. This prevents you from injuring your feet. Always look in your shoes before you put them on to be sure there are no objects inside.  Do not cross your legs. This may decrease the blood flow to your feet.  If you find a minor scrape,  cut, or break in the skin on your feet, keep it and the skin around it clean and dry. These areas may be cleansed with mild soap and water. Do not cleanse the area with peroxide, alcohol, or iodine.  When you remove an adhesive bandage, be sure not to damage the skin around it.  If you have a wound, look at it several times a day to make sure it is healing.  Do not use heating pads or hot water bottles. They may burn your skin. If you have lost feeling in your feet or legs, you may not know it is happening until it is too late.  Make sure your health care provider performs a complete foot exam at least annually or more often if you have foot problems. Report any cuts, sores, or bruises to your health care provider immediately. SEEK MEDICAL CARE IF:   You have an injury that is not healing.  You have cuts or breaks in the skin.  You have an ingrown nail.  You notice redness on your legs or feet.  You feel burning or tingling in your legs or feet.  You have pain or cramps in your legs and feet.  Your legs or feet are numb.  Your feet always feel cold. SEEK IMMEDIATE MEDICAL CARE IF:   There is increasing redness,   swelling, or pain in or around a wound.  There is a red line that goes up your leg.  Pus is coming from a wound.  You develop a fever or as directed by your health care provider.  You notice a bad smell coming from an ulcer or wound.   This information is not intended to replace advice given to you by your health care provider. Make sure you discuss any questions you have with your health care provider.   Document Released: 03/20/2000 Document Revised: 11/23/2012 Document Reviewed: 08/30/2012 Elsevier Interactive Patient Education 2016 Elsevier Inc.  

## 2015-04-24 NOTE — Progress Notes (Signed)
Patient ID: [name] [name], female   DOB: 07/12/1945, 70 y.o.   MRN: [id number]  Subjective: This patient presents today complaining of elongated and thickened toenails for some uncomfortable walking wearing shoes and requests toenail debridement. The last visit for this similar service was on 11/21/2014  Objective: Chronic bilateral lymphedema wrap with compression dressings The toenails 6-10 are extremely elongated, brittle, deformed, incurvated, discolored and tender to direct palpation 6-10  Assessment: Symptomatic onychomycoses 6-10 Diabetic peripheral neuropathy Chronic lymphedema bilaterally  Plan: Debridement of toenails 6-10 mechanically and electronically without any bleeding  Reappoint 3 months

## 2015-05-11 ENCOUNTER — Other Ambulatory Visit: Payer: Self-pay | Admitting: Internal Medicine

## 2015-06-10 ENCOUNTER — Other Ambulatory Visit: Payer: Self-pay | Admitting: Internal Medicine

## 2015-07-05 LAB — GLUCOSE, POCT (MANUAL RESULT ENTRY): POC Glucose: 97 mg/dl (ref 70–99)

## 2015-07-24 ENCOUNTER — Ambulatory Visit (INDEPENDENT_AMBULATORY_CARE_PROVIDER_SITE_OTHER): Payer: PPO | Admitting: Podiatry

## 2015-07-24 ENCOUNTER — Encounter: Payer: Self-pay | Admitting: Podiatry

## 2015-07-24 DIAGNOSIS — B351 Tinea unguium: Secondary | ICD-10-CM | POA: Diagnosis not present

## 2015-07-24 DIAGNOSIS — M79676 Pain in unspecified toe(s): Secondary | ICD-10-CM

## 2015-07-24 NOTE — Progress Notes (Signed)
Patient ID: [name] [name], female   DOB: 03/15/1946, 70 y.o.   MRN: [id number]   Subjective: This patient presents today complaining of elongated and thickened toenails for some uncomfortable walking wearing shoes and requests toenail debridement. The last visit for this similar service was on 04/24/2015. Patient also requesting diabetic shoes  Objective: Chronic bilateral lymphedema , bilaterally  DP and PT pulses 1/4 bilaterally Capillary reflex within normals bilaterally Sensation to 10 g monofilament wire intact 4/5 right 5/5 left Vibratory sensation reactive bilaterally Ankle reflexes nonreactive bilaterally HAV right he toenails 6-10 are extremely elongated, brittle, deformed, incurvated, discolored and tender to direct palpation 6-10 Scaling skin heals bilaterally Plantar keratoses fifth MPJ bilaterally  Assessment: Symptomatic onychomycoses 6-10 Diabetic peripheral neuropathy Chronic lymphedema bilaterally  Plan: Debridement of toenails 6-10 mechanically and electronically without any bleeding Indications for diabetic shoes Type II diabetic with decreased pulsations right and left feet Lymphedema bilaterally Loss of deep tendon reflex HAV right  Will obtain certification from Dr. [name] and notify patient for visit to schedule diabetic shoe measurement   Reappoint 3 months

## 2015-07-24 NOTE — Patient Instructions (Signed)
Diabetes and Foot Care Diabetes may cause you to have problems because of poor blood supply (circulation) to your feet and legs. This may cause the skin on your feet to become thinner, break easier, and heal more slowly. Your skin may become dry, and the skin may peel and crack. You may also have nerve damage in your legs and feet causing decreased feeling in them. You may not notice minor injuries to your feet that could lead to infections or more serious problems. Taking care of your feet is one of the most important things you can do for yourself.  HOME CARE INSTRUCTIONS  Wear shoes at all times, even in the house. Do not go barefoot. Bare feet are easily injured.  Check your feet daily for blisters, cuts, and redness. If you cannot see the bottom of your feet, use a mirror or ask someone for help.  Wash your feet with warm water (do not use hot water) and mild soap. Then pat your feet and the areas between your toes until they are completely dry. Do not soak your feet as this can dry your skin.  Apply a moisturizing lotion or petroleum jelly (that does not contain alcohol and is unscented) to the skin on your feet and to dry, brittle toenails. Do not apply lotion between your toes.  Trim your toenails straight across. Do not dig under them or around the cuticle. File the edges of your nails with an emery board or nail file.  Do not cut corns or calluses or try to remove them with medicine.  Wear clean socks or stockings every day. Make sure they are not too tight. Do not wear knee-high stockings since they may decrease blood flow to your legs.  Wear shoes that fit properly and have enough cushioning. To break in new shoes, wear them for just a few hours a day. This prevents you from injuring your feet. Always look in your shoes before you put them on to be sure there are no objects inside.  Do not cross your legs. This may decrease the blood flow to your feet.  If you find a minor scrape,  cut, or break in the skin on your feet, keep it and the skin around it clean and dry. These areas may be cleansed with mild soap and water. Do not cleanse the area with peroxide, alcohol, or iodine.  When you remove an adhesive bandage, be sure not to damage the skin around it.  If you have a wound, look at it several times a day to make sure it is healing.  Do not use heating pads or hot water bottles. They may burn your skin. If you have lost feeling in your feet or legs, you may not know it is happening until it is too late.  Make sure your health care provider performs a complete foot exam at least annually or more often if you have foot problems. Report any cuts, sores, or bruises to your health care provider immediately. SEEK MEDICAL CARE IF:   You have an injury that is not healing.  You have cuts or breaks in the skin.  You have an ingrown nail.  You notice redness on your legs or feet.  You feel burning or tingling in your legs or feet.  You have pain or cramps in your legs and feet.  Your legs or feet are numb.  Your feet always feel cold. SEEK IMMEDIATE MEDICAL CARE IF:   There is increasing redness,   swelling, or pain in or around a wound.  There is a red line that goes up your leg.  Pus is coming from a wound.  You develop a fever or as directed by your health care provider.  You notice a bad smell coming from an ulcer or wound.   This information is not intended to replace advice given to you by your health care provider. Make sure you discuss any questions you have with your health care provider.   Document Released: 03/20/2000 Document Revised: 11/23/2012 Document Reviewed: 08/30/2012 Elsevier Interactive Patient Education 2016 Elsevier Inc.  

## 2015-09-03 ENCOUNTER — Encounter: Payer: Self-pay | Admitting: Internal Medicine

## 2015-09-03 ENCOUNTER — Other Ambulatory Visit (INDEPENDENT_AMBULATORY_CARE_PROVIDER_SITE_OTHER): Payer: PPO

## 2015-09-03 ENCOUNTER — Ambulatory Visit (INDEPENDENT_AMBULATORY_CARE_PROVIDER_SITE_OTHER): Payer: PPO | Admitting: Internal Medicine

## 2015-09-03 VITALS — BP 140/90 | HR 79 | Temp 98.5°F | Resp 16 | Ht 71.0 in | Wt 321.0 lb

## 2015-09-03 DIAGNOSIS — I878 Other specified disorders of veins: Secondary | ICD-10-CM | POA: Diagnosis not present

## 2015-09-03 DIAGNOSIS — I10 Essential (primary) hypertension: Secondary | ICD-10-CM | POA: Diagnosis not present

## 2015-09-03 DIAGNOSIS — E119 Type 2 diabetes mellitus without complications: Secondary | ICD-10-CM

## 2015-09-03 LAB — LIPID PANEL
Cholesterol: 135 mg/dL (ref 0–200)
HDL: 51.8 mg/dL (ref >39.00)
LDL Cholesterol: 73 mg/dL (ref 0–99)
NonHDL: 82.74
Total CHOL/HDL Ratio: 3
Triglycerides: 48 mg/dL (ref 0.0–149.0)
VLDL: 9.6 mg/dL (ref 0.0–40.0)

## 2015-09-03 LAB — COMPREHENSIVE METABOLIC PANEL
ALT: 8 U/L (ref 0–35)
AST: 14 U/L (ref 0–37)
Albumin: 3.7 g/dL (ref 3.5–5.2)
Alkaline Phosphatase: 54 U/L (ref 39–117)
BUN: 15 mg/dL (ref 6–23)
CO2: 24 mEq/L (ref 19–32)
Calcium: 9.6 mg/dL (ref 8.4–10.5)
Chloride: 108 mEq/L (ref 96–112)
Creatinine, Ser: 0.79 mg/dL (ref 0.40–1.20)
GFR: 92.83 mL/min (ref >60.00)
Glucose, Bld: 104 mg/dL — ABNORMAL HIGH (ref 70–99)
Potassium: 3.8 mEq/L (ref 3.5–5.1)
Sodium: 139 mEq/L (ref 135–145)
Total Bilirubin: 0.3 mg/dL (ref 0.2–1.2)
Total Protein: 9.1 g/dL — ABNORMAL HIGH (ref 6.0–8.3)

## 2015-09-03 LAB — HEMOGLOBIN A1C: Hgb A1c MFr Bld: 6.3 % (ref 4.6–6.5)

## 2015-09-03 MED ORDER — METFORMIN HCL 500 MG PO TABS: 500.0000 mg | ORAL_TABLET | Freq: Two times a day (BID) | ORAL | Status: DC

## 2015-09-03 MED ORDER — AMLODIPINE BESYLATE 10 MG PO TABS: 10.0000 mg | ORAL_TABLET | Freq: Every day | ORAL | Status: DC

## 2015-09-03 NOTE — Assessment & Plan Note (Signed)
Not complicated, she is supposed to be taking metformin daily but has still been taking BID. Checking HgA1c and adjust as needed. Taking losartan as well. She states up to date on labs.

## 2015-09-03 NOTE — Patient Instructions (Signed)
We are checking the labs today and will call back about the results.  It may be helpful to go back to the wound clinic to wrap the legs and work on the edema.   We have given you a prescription for the wheeled walker so that you can get that.

## 2015-09-03 NOTE — Assessment & Plan Note (Signed)
BP better on recheck, amlodipine and losartan and metoprolol are adequate at this time. Checking labs and adjust as needed.

## 2015-09-03 NOTE — Progress Notes (Signed)
   Subjective:    Patient ID: [name] [name], female    DOB: [birth year], 70 y.o.   MRN: [id number]  HPI The patient is a 70 YO female coming in for follow up of her diabetes (taking metformin and losartan, not complicated, no low sugars or changes), her blood pressure (taking amlodipine, losartan, metoprolol, at goal, not complicated) and her venous stasis (they are swelling more again, previously seen at the wound center for reduction which helped, they do leak and the odor bothers her). Her walker is wearing out and somewhat broken and she needs new rx for wheeler walker with seat.    Review of Systems  Constitutional: Negative for fever, appetite change and fatigue.  HENT: Negative.   Respiratory: Negative for cough, chest tightness, shortness of breath and wheezing.   Cardiovascular: Positive for leg swelling. Negative for chest pain and palpitations.  Gastrointestinal: Negative for abdominal pain, diarrhea, constipation and abdominal distention.  Musculoskeletal: Positive for myalgias, arthralgias and gait problem.  Skin: Positive for color change.       Chronic in her legs  Neurological: Negative for weakness, light-headedness and headaches.  Psychiatric/Behavioral: Negative.       Objective:   Physical Exam  Constitutional: She is oriented to person, place, and time. She appears well-developed and well-nourished.  obese  HENT:  Head: Normocephalic and atraumatic.  Eyes: EOM are normal.  Neck: Normal range of motion.  Cardiovascular: Normal rate and regular rhythm.   Pulmonary/Chest: Effort normal and breath sounds normal. No respiratory distress. She has no wheezes. She has no rales.  Abdominal: Soft. She exhibits no distension. There is no tenderness. There is no rebound.  Musculoskeletal: She exhibits edema.  Legs larger than normal, both equal  Neurological: She is alert and oriented to person, place, and time. Coordination abnormal.  Walks hunched over walker and fairly  steady.  Skin: Skin is warm.  Vitals reviewed.  Filed Vitals:   09/03/15 0804 09/03/15 0836  BP: 158/88 140/90  Pulse: 79   Temp: 98.5 F (36.9 C)   TempSrc: Oral   Resp: 16   Height: 5\' 11"  (1.803 m)   Weight: 321 lb (145.605 kg)   SpO2: 98%       Assessment & Plan:

## 2015-09-03 NOTE — Progress Notes (Signed)
Pre visit review using our clinic review tool, if applicable. No additional management support is needed unless otherwise documented below in the visit note. 

## 2015-09-03 NOTE — Assessment & Plan Note (Signed)
Advised her to return to wound clinic for wrapping and to reduce the swelling in her legs. Rx for wheeled walker with seat today.

## 2015-09-10 ENCOUNTER — Telehealth: Payer: Self-pay | Admitting: Internal Medicine

## 2015-09-10 NOTE — Telephone Encounter (Signed)
Is requesting lab results from last week.

## 2015-09-11 NOTE — Telephone Encounter (Signed)
Spoke with patient and gave her the lab results 

## 2015-09-26 DIAGNOSIS — R269 Unspecified abnormalities of gait and mobility: Secondary | ICD-10-CM | POA: Diagnosis not present

## 2015-10-29 ENCOUNTER — Ambulatory Visit: Payer: PPO | Admitting: Podiatry

## 2015-11-13 ENCOUNTER — Encounter: Payer: Self-pay | Admitting: Podiatry

## 2015-11-13 ENCOUNTER — Ambulatory Visit (INDEPENDENT_AMBULATORY_CARE_PROVIDER_SITE_OTHER): Payer: PPO | Admitting: Podiatry

## 2015-11-13 DIAGNOSIS — M79676 Pain in unspecified toe(s): Secondary | ICD-10-CM

## 2015-11-13 DIAGNOSIS — B351 Tinea unguium: Secondary | ICD-10-CM | POA: Diagnosis not present

## 2015-11-13 NOTE — Patient Instructions (Signed)
Diabetes and Foot Care Diabetes may cause you to have problems because of poor blood supply (circulation) to your feet and legs. This may cause the skin on your feet to become thinner, break easier, and heal more slowly. Your skin may become dry, and the skin may peel and crack. You may also have nerve damage in your legs and feet causing decreased feeling in them. You may not notice minor injuries to your feet that could lead to infections or more serious problems. Taking care of your feet is one of the most important things you can do for yourself.  HOME CARE INSTRUCTIONS  Wear shoes at all times, even in the house. Do not go barefoot. Bare feet are easily injured.  Check your feet daily for blisters, cuts, and redness. If you cannot see the bottom of your feet, use a mirror or ask someone for help.  Wash your feet with warm water (do not use hot water) and mild soap. Then pat your feet and the areas between your toes until they are completely dry. Do not soak your feet as this can dry your skin.  Apply a moisturizing lotion or petroleum jelly (that does not contain alcohol and is unscented) to the skin on your feet and to dry, brittle toenails. Do not apply lotion between your toes.  Trim your toenails straight across. Do not dig under them or around the cuticle. File the edges of your nails with an emery board or nail file.  Do not cut corns or calluses or try to remove them with medicine.  Wear clean socks or stockings every day. Make sure they are not too tight. Do not wear knee-high stockings since they may decrease blood flow to your legs.  Wear shoes that fit properly and have enough cushioning. To break in new shoes, wear them for just a few hours a day. This prevents you from injuring your feet. Always look in your shoes before you put them on to be sure there are no objects inside.  Do not cross your legs. This may decrease the blood flow to your feet.  If you find a minor scrape,  cut, or break in the skin on your feet, keep it and the skin around it clean and dry. These areas may be cleansed with mild soap and water. Do not cleanse the area with peroxide, alcohol, or iodine.  When you remove an adhesive bandage, be sure not to damage the skin around it.  If you have a wound, look at it several times a day to make sure it is healing.  Do not use heating pads or hot water bottles. They may burn your skin. If you have lost feeling in your feet or legs, you may not know it is happening until it is too late.  Make sure your health care provider performs a complete foot exam at least annually or more often if you have foot problems. Report any cuts, sores, or bruises to your health care provider immediately. SEEK MEDICAL CARE IF:   You have an injury that is not healing.  You have cuts or breaks in the skin.  You have an ingrown nail.  You notice redness on your legs or feet.  You feel burning or tingling in your legs or feet.  You have pain or cramps in your legs and feet.  Your legs or feet are numb.  Your feet always feel cold. SEEK IMMEDIATE MEDICAL CARE IF:   There is increasing redness,   swelling, or pain in or around a wound.  There is a red line that goes up your leg.  Pus is coming from a wound.  You develop a fever or as directed by your health care provider.  You notice a bad smell coming from an ulcer or wound.   This information is not intended to replace advice given to you by your health care provider. Make sure you discuss any questions you have with your health care provider.   Document Released: 03/20/2000 Document Revised: 11/23/2012 Document Reviewed: 08/30/2012 Elsevier Interactive Patient Education 2016 Elsevier Inc.  

## 2015-11-14 NOTE — Progress Notes (Signed)
Patient ID: [name] [name], female   DOB: [birth year], 70 y.o.   MRN: [id number]   Subjective: This patient presents today complaining of elongated and thickened toenails for some uncomfortable walking wearing shoes and requests toenail debridement. The last visit for this similar service was on 04/24/2015. Patient also requesting diabetic shoes  Objective: Chronic bilateral lymphedema , bilaterally  DP and PT pulses 1/4 bilaterally Capillary reflex within normals bilaterally Sensation to 10 g monofilament wire intact 4/5 right 5/5 left Vibratory sensation reactive bilaterally Ankle reflexes nonreactive bilaterally HAV right he toenails 6-10 are extremely elongated, brittle, deformed, incurvated, discolored and tender to direct palpation 6-10 Scaling skin heals bilaterally Plantar keratoses fifth MPJ bilaterally  Assessment: Symptomatic onychomycoses 6-10 Diabetic peripheral neuropathy Chronic lymphedema bilaterally  Plan: Debridement of toenails 6-10 mechanically and electronically without any bleeding Indications for diabetic shoes Type II diabetic with decreased pulsations right and left feet Lymphedema bilaterally Loss of deep tendon reflex HAV right  Will obtain certification from Dr. [name] and notify patient for visit to schedule diabetic shoe measurement,  pending   Reappoint 3 months

## 2015-12-08 ENCOUNTER — Other Ambulatory Visit: Payer: Self-pay | Admitting: Internal Medicine

## 2016-01-04 ENCOUNTER — Other Ambulatory Visit: Payer: Self-pay | Admitting: Cardiology

## 2016-01-07 NOTE — Telephone Encounter (Signed)
Rx request sent to pharmacy.  

## 2016-02-03 ENCOUNTER — Other Ambulatory Visit: Payer: Self-pay | Admitting: Cardiology

## 2016-02-03 DIAGNOSIS — I471 Supraventricular tachycardia: Secondary | ICD-10-CM

## 2016-02-03 NOTE — Telephone Encounter (Signed)
Rx request sent to pharmacy.  

## 2016-02-07 ENCOUNTER — Encounter (HOSPITAL_BASED_OUTPATIENT_CLINIC_OR_DEPARTMENT_OTHER): Payer: PPO | Attending: Internal Medicine

## 2016-02-07 DIAGNOSIS — E119 Type 2 diabetes mellitus without complications: Secondary | ICD-10-CM | POA: Insufficient documentation

## 2016-02-07 DIAGNOSIS — L97811 Non-pressure chronic ulcer of other part of right lower leg limited to breakdown of skin: Secondary | ICD-10-CM | POA: Diagnosis not present

## 2016-02-07 DIAGNOSIS — L97222 Non-pressure chronic ulcer of left calf with fat layer exposed: Secondary | ICD-10-CM | POA: Insufficient documentation

## 2016-02-07 DIAGNOSIS — L97212 Non-pressure chronic ulcer of right calf with fat layer exposed: Secondary | ICD-10-CM | POA: Diagnosis not present

## 2016-02-07 DIAGNOSIS — I739 Peripheral vascular disease, unspecified: Secondary | ICD-10-CM | POA: Insufficient documentation

## 2016-02-07 DIAGNOSIS — M1990 Unspecified osteoarthritis, unspecified site: Secondary | ICD-10-CM | POA: Insufficient documentation

## 2016-02-07 DIAGNOSIS — I110 Hypertensive heart disease with heart failure: Secondary | ICD-10-CM | POA: Diagnosis not present

## 2016-02-07 DIAGNOSIS — I509 Heart failure, unspecified: Secondary | ICD-10-CM | POA: Diagnosis not present

## 2016-02-07 DIAGNOSIS — I890 Lymphedema, not elsewhere classified: Secondary | ICD-10-CM | POA: Insufficient documentation

## 2016-02-07 DIAGNOSIS — I87333 Chronic venous hypertension (idiopathic) with ulcer and inflammation of bilateral lower extremity: Secondary | ICD-10-CM | POA: Diagnosis not present

## 2016-02-07 DIAGNOSIS — Z7984 Long term (current) use of oral hypoglycemic drugs: Secondary | ICD-10-CM | POA: Insufficient documentation

## 2016-02-07 DIAGNOSIS — L97821 Non-pressure chronic ulcer of other part of left lower leg limited to breakdown of skin: Secondary | ICD-10-CM | POA: Diagnosis not present

## 2016-02-10 DIAGNOSIS — H2513 Age-related nuclear cataract, bilateral: Secondary | ICD-10-CM | POA: Diagnosis not present

## 2016-02-10 DIAGNOSIS — H353131 Nonexudative age-related macular degeneration, bilateral, early dry stage: Secondary | ICD-10-CM | POA: Diagnosis not present

## 2016-02-10 DIAGNOSIS — E119 Type 2 diabetes mellitus without complications: Secondary | ICD-10-CM | POA: Diagnosis not present

## 2016-02-10 DIAGNOSIS — H43813 Vitreous degeneration, bilateral: Secondary | ICD-10-CM | POA: Diagnosis not present

## 2016-02-12 ENCOUNTER — Encounter: Payer: Self-pay | Admitting: Podiatry

## 2016-02-12 ENCOUNTER — Ambulatory Visit (INDEPENDENT_AMBULATORY_CARE_PROVIDER_SITE_OTHER): Payer: PPO | Admitting: Podiatry

## 2016-02-12 VITALS — BP 157/78 | HR 75 | Resp 18

## 2016-02-12 DIAGNOSIS — B351 Tinea unguium: Secondary | ICD-10-CM | POA: Diagnosis not present

## 2016-02-12 DIAGNOSIS — M79676 Pain in unspecified toe(s): Secondary | ICD-10-CM

## 2016-02-12 NOTE — Progress Notes (Signed)
Patient ID: [name] [name], female   DOB: 1946/02/22, 70 y.o.   MRN: [id number]   Subjective: This patient presents for scheduled visit complaining of uncomfortable toenails when walking wearing shoes and request toenail debridement. Patient is undergoing compression therapy for chronic lymphedema with weekly dressings with therapy initiated approximately a week ago.  Objective: DP and PT pulses 1/4 bilaterally Sensation to 10 g monofilament wire intact 4/5 bilaterally Vibratory sensation reactive bilaterally Ankle reflexes nonreactive bilaterally HAV right Toenails are hypertrophic elongated, brittle, discolored 6-10 Bilateral lower extremity compression dressings from knees to proximal MPJs bilaterally  Assessment: Chronic lymphedema bilaterally undergoing compression therapy Diabetic peripheral neuropathy Symptomatic onychomycoses 6-10  Plan: Debridement toenails 6-10 mechanically and leg without any bleeding Defer on diabetic shoes he cause of compression dressings Indication for diabetic shoes include lymphedema bilaterally Loss of deep tendon reflexes HAV right  Reappoint 3 months

## 2016-02-12 NOTE — Progress Notes (Signed)
   Subjective:    Patient ID: [name] [name], female    DOB: Dec 04, 1945, 70 y.o.   MRN: [id number]  HPI I am here to get my toenails trimmed up and I have had some places on my legs that I have tried to get in with the wound center since may and I have just seen the wound care doctor last week and the legs are draining and they do stink    Review of Systems  All other systems reviewed and are negative.      Objective:   Physical Exam        Assessment & Plan:

## 2016-02-12 NOTE — Patient Instructions (Signed)
Diabetes and Foot Care Diabetes may cause you to have problems because of poor blood supply (circulation) to your feet and legs. This may cause the skin on your feet to become thinner, break easier, and heal more slowly. Your skin may become dry, and the skin may peel and crack. You may also have nerve damage in your legs and feet causing decreased feeling in them. You may not notice minor injuries to your feet that could lead to infections or more serious problems. Taking care of your feet is one of the most important things you can do for yourself.  HOME CARE INSTRUCTIONS  Wear shoes at all times, even in the house. Do not go barefoot. Bare feet are easily injured.  Check your feet daily for blisters, cuts, and redness. If you cannot see the bottom of your feet, use a mirror or ask someone for help.  Wash your feet with warm water (do not use hot water) and mild soap. Then pat your feet and the areas between your toes until they are completely dry. Do not soak your feet as this can dry your skin.  Apply a moisturizing lotion or petroleum jelly (that does not contain alcohol and is unscented) to the skin on your feet and to dry, brittle toenails. Do not apply lotion between your toes.  Trim your toenails straight across. Do not dig under them or around the cuticle. File the edges of your nails with an emery board or nail file.  Do not cut corns or calluses or try to remove them with medicine.  Wear clean socks or stockings every day. Make sure they are not too tight. Do not wear knee-high stockings since they may decrease blood flow to your legs.  Wear shoes that fit properly and have enough cushioning. To break in new shoes, wear them for just a few hours a day. This prevents you from injuring your feet. Always look in your shoes before you put them on to be sure there are no objects inside.  Do not cross your legs. This may decrease the blood flow to your feet.  If you find a minor scrape,  cut, or break in the skin on your feet, keep it and the skin around it clean and dry. These areas may be cleansed with mild soap and water. Do not cleanse the area with peroxide, alcohol, or iodine.  When you remove an adhesive bandage, be sure not to damage the skin around it.  If you have a wound, look at it several times a day to make sure it is healing.  Do not use heating pads or hot water bottles. They may burn your skin. If you have lost feeling in your feet or legs, you may not know it is happening until it is too late.  Make sure your health care provider performs a complete foot exam at least annually or more often if you have foot problems. Report any cuts, sores, or bruises to your health care provider immediately. SEEK MEDICAL CARE IF:   You have an injury that is not healing.  You have cuts or breaks in the skin.  You have an ingrown nail.  You notice redness on your legs or feet.  You feel burning or tingling in your legs or feet.  You have pain or cramps in your legs and feet.  Your legs or feet are numb.  Your feet always feel cold. SEEK IMMEDIATE MEDICAL CARE IF:   There is increasing redness,   swelling, or pain in or around a wound.  There is a red line that goes up your leg.  Pus is coming from a wound.  You develop a fever or as directed by your health care provider.  You notice a bad smell coming from an ulcer or wound.   This information is not intended to replace advice given to you by your health care provider. Make sure you discuss any questions you have with your health care provider.   Document Released: 03/20/2000 Document Revised: 11/23/2012 Document Reviewed: 08/30/2012 Elsevier Interactive Patient Education 2016 Elsevier Inc.  

## 2016-02-13 ENCOUNTER — Encounter (HOSPITAL_BASED_OUTPATIENT_CLINIC_OR_DEPARTMENT_OTHER): Payer: PPO

## 2016-02-14 DIAGNOSIS — I87333 Chronic venous hypertension (idiopathic) with ulcer and inflammation of bilateral lower extremity: Secondary | ICD-10-CM | POA: Diagnosis not present

## 2016-02-14 DIAGNOSIS — I890 Lymphedema, not elsewhere classified: Secondary | ICD-10-CM | POA: Diagnosis not present

## 2016-02-14 DIAGNOSIS — L97221 Non-pressure chronic ulcer of left calf limited to breakdown of skin: Secondary | ICD-10-CM | POA: Diagnosis not present

## 2016-02-14 DIAGNOSIS — L97211 Non-pressure chronic ulcer of right calf limited to breakdown of skin: Secondary | ICD-10-CM | POA: Diagnosis not present

## 2016-02-21 DIAGNOSIS — Z7984 Long term (current) use of oral hypoglycemic drugs: Secondary | ICD-10-CM | POA: Insufficient documentation

## 2016-02-21 DIAGNOSIS — I110 Hypertensive heart disease with heart failure: Secondary | ICD-10-CM | POA: Insufficient documentation

## 2016-02-21 DIAGNOSIS — L97212 Non-pressure chronic ulcer of right calf with fat layer exposed: Secondary | ICD-10-CM | POA: Insufficient documentation

## 2016-02-21 DIAGNOSIS — I739 Peripheral vascular disease, unspecified: Secondary | ICD-10-CM | POA: Insufficient documentation

## 2016-02-21 DIAGNOSIS — I509 Heart failure, unspecified: Secondary | ICD-10-CM | POA: Insufficient documentation

## 2016-02-21 DIAGNOSIS — I890 Lymphedema, not elsewhere classified: Secondary | ICD-10-CM | POA: Insufficient documentation

## 2016-02-21 DIAGNOSIS — E119 Type 2 diabetes mellitus without complications: Secondary | ICD-10-CM | POA: Insufficient documentation

## 2016-02-21 DIAGNOSIS — M1990 Unspecified osteoarthritis, unspecified site: Secondary | ICD-10-CM | POA: Insufficient documentation

## 2016-02-21 DIAGNOSIS — I87333 Chronic venous hypertension (idiopathic) with ulcer and inflammation of bilateral lower extremity: Secondary | ICD-10-CM | POA: Insufficient documentation

## 2016-02-21 DIAGNOSIS — L97222 Non-pressure chronic ulcer of left calf with fat layer exposed: Secondary | ICD-10-CM | POA: Insufficient documentation

## 2016-02-25 DIAGNOSIS — I87333 Chronic venous hypertension (idiopathic) with ulcer and inflammation of bilateral lower extremity: Secondary | ICD-10-CM | POA: Diagnosis not present

## 2016-03-03 ENCOUNTER — Other Ambulatory Visit (INDEPENDENT_AMBULATORY_CARE_PROVIDER_SITE_OTHER): Payer: PPO

## 2016-03-03 ENCOUNTER — Ambulatory Visit (INDEPENDENT_AMBULATORY_CARE_PROVIDER_SITE_OTHER): Payer: PPO | Admitting: Internal Medicine

## 2016-03-03 ENCOUNTER — Encounter: Payer: Self-pay | Admitting: Internal Medicine

## 2016-03-03 DIAGNOSIS — E119 Type 2 diabetes mellitus without complications: Secondary | ICD-10-CM | POA: Diagnosis not present

## 2016-03-03 DIAGNOSIS — Z23 Encounter for immunization: Secondary | ICD-10-CM | POA: Diagnosis not present

## 2016-03-03 DIAGNOSIS — Z0000 Encounter for general adult medical examination without abnormal findings: Secondary | ICD-10-CM | POA: Diagnosis not present

## 2016-03-03 DIAGNOSIS — I10 Essential (primary) hypertension: Secondary | ICD-10-CM | POA: Diagnosis not present

## 2016-03-03 DIAGNOSIS — H9193 Unspecified hearing loss, bilateral: Secondary | ICD-10-CM

## 2016-03-03 LAB — HEMOGLOBIN A1C: Hgb A1c MFr Bld: 5.5 % (ref 4.6–6.5)

## 2016-03-03 MED ORDER — TRIAMCINOLONE ACETONIDE 0.1 % EX CREA: 1.0000 "application " | TOPICAL_CREAM | Freq: Two times a day (BID) | CUTANEOUS | 1 refills | Status: DC

## 2016-03-03 NOTE — Assessment & Plan Note (Signed)
Checking HgA1c, stopped taking her losartan as she did not realize it was medicine. She will resume and continue her metformin BID. Not complicated. Recent eye exam with gro[facility] eye.

## 2016-03-03 NOTE — Assessment & Plan Note (Signed)
BP above goal off losartan and she will resume and continue amlodipine 10 mg daily as well and metoprolol 50 mg BID.

## 2016-03-03 NOTE — Progress Notes (Signed)
   Subjective:    Patient ID: [name] [name], female    DOB: January 11, 1946, 70 y.o.   MRN: [id number]  HPI Here for medicare wellness and physical, no new complaints. Please see A/P for status and treatment of chronic medical problems.   Diet: DM since diabetic Physical activity: sedentary Depression/mood screen: negative Hearing: mild to moderate loss bilaterally, wants hearing test Visual acuity: grossly normal with bifocal, performs annual eye exam  ADLs: capable Fall risk: none Home safety: good, uses walker with seat Cognitive evaluation: intact to orientation, naming, recall and repetition EOL planning: adv directives discussed  I have personally reviewed and have noted 1. The patient's medical and social history - reviewed today no changes 2. Their use of alcohol, tobacco or illicit drugs 3. Their current medications and supplements 4. The patient's functional ability including ADL's, fall risks, home safety risks and hearing or visual impairment. 5. Diet and physical activities 6. Evidence for depression or mood disorders 7. Care team reviewed and updated (available in snapshot)  Review of Systems  Constitutional: Positive for activity change. Negative for appetite change, fatigue, fever and unexpected weight change.  HENT: Negative.   Eyes: Negative.   Respiratory: Negative for cough, chest tightness and shortness of breath.   Cardiovascular: Positive for leg swelling. Negative for chest pain and palpitations.  Gastrointestinal: Negative for abdominal distention, abdominal pain, constipation, diarrhea, nausea and vomiting.  Musculoskeletal: Positive for arthralgias and gait problem. Negative for joint swelling, myalgias, neck pain and neck stiffness.  Skin: Negative.   Neurological: Negative for dizziness, tremors, seizures, weakness, light-headedness, numbness and headaches.  Psychiatric/Behavioral: Negative.       Objective:   Physical Exam  Constitutional: She is  oriented to person, place, and time. She appears well-developed and well-nourished.  HENT:  Head: Normocephalic and atraumatic.  Eyes: EOM are normal.  Neck: Normal range of motion.  Cardiovascular: Normal rate and regular rhythm.   Pulmonary/Chest: Effort normal and breath sounds normal. No respiratory distress. She has no wheezes. She has no rales.  Abdominal: Soft. Bowel sounds are normal. She exhibits no distension. There is no tenderness. There is no rebound.  Musculoskeletal: She exhibits edema.  Stable 2-3 + edema both legs to the hips, wrapped today  Neurological: She is alert and oriented to person, place, and time. Coordination normal.  Skin: Skin is warm and dry.  Psychiatric: She has a normal mood and affect.   Vitals:   03/03/16 0805  BP: (!) 160/78  Pulse: 65  Resp: 18  Temp: 98.3 F (36.8 C)  TempSrc: Oral  SpO2: 98%  Weight: 282 lb (127.9 kg)  Height: 5\' 11"  (1.803 m)      Assessment & Plan:  Flu shot given at visit.

## 2016-03-03 NOTE — Patient Instructions (Signed)
We have sent in the cream for the rash which you can use twice a day until it's gone.   We are checking the labs today and will call you back with the results.   Keep doing the senior group to stay active. We will get you a hearing test.  Health Maintenance, Female Introduction Adopting a healthy lifestyle and getting preventive care can go a long way to promote health and wellness. Talk with your health care provider about what schedule of regular examinations is right for you. This is a good chance for you to check in with your provider about disease prevention and staying healthy. In between checkups, there are plenty of things you can do on your own. Experts have done a lot of research about which lifestyle changes and preventive measures are most likely to keep you healthy. Ask your health care provider for more information. Weight and diet Eat a healthy diet  Be sure to include plenty of vegetables, fruits, low-fat dairy products, and lean protein.  Do not eat a lot of foods high in solid fats, added sugars, or salt.  Get regular exercise. This is one of the most important things you can do for your health.  Most adults should exercise for at least 150 minutes each week. The exercise should increase your heart rate and make you sweat (moderate-intensity exercise).  Most adults should also do strengthening exercises at least twice a week. This is in addition to the moderate-intensity exercise. Maintain a healthy weight  Body mass index (BMI) is a measurement that can be used to identify possible weight problems. It estimates body fat based on height and weight. Your health care provider can help determine your BMI and help you achieve or maintain a healthy weight.  For females 78 years of age and older:  A BMI below 18.5 is considered underweight.  A BMI of 18.5 to 24.9 is normal.  A BMI of 25 to 29.9 is considered overweight.  A BMI of 30 and above is considered obese. Watch  levels of cholesterol and blood lipids  You should start having your blood tested for lipids and cholesterol at 69 years of age, then have this test every 5 years.  You may need to have your cholesterol levels checked more often if:  Your lipid or cholesterol levels are high.  You are older than 69 years of age.  You are at high risk for heart disease. Cancer screening Lung Cancer  Lung cancer screening is recommended for adults 47-64 years old who are at high risk for lung cancer because of a history of smoking.  A yearly low-dose CT scan of the lungs is recommended for people who:  Currently smoke.  Have quit within the past 15 years.  Have at least a 30-pack-year history of smoking. A pack year is smoking an average of one pack of cigarettes a day for 1 year.  Yearly screening should continue until it has been 15 years since you quit.  Yearly screening should stop if you develop a health problem that would prevent you from having lung cancer treatment. Breast Cancer  Practice breast self-awareness. This means understanding how your breasts normally appear and feel.  It also means doing regular breast self-exams. Let your health care provider know about any changes, no matter how small.  If you are in your 20s or 30s, you should have a clinical breast exam (CBE) by a health care provider every 1-3 years as part of a  regular health exam.  If you are 40 or older, have a CBE every year. Also consider having a breast X-ray (mammogram) every year.  If you have a family history of breast cancer, talk to your health care provider about genetic screening.  If you are at high risk for breast cancer, talk to your health care provider about having an MRI and a mammogram every year.  Breast cancer gene (BRCA) assessment is recommended for women who have family members with BRCA-related cancers. BRCA-related cancers include:  Breast.  Ovarian.  Tubal.  Peritoneal  cancers.  Results of the assessment will determine the need for genetic counseling and BRCA1 and BRCA2 testing. Cervical Cancer  Your health care provider may recommend that you be screened regularly for cancer of the pelvic organs (ovaries, uterus, and vagina). This screening involves a pelvic examination, including checking for microscopic changes to the surface of your cervix (Pap test). You may be encouraged to have this screening done every 3 years, beginning at age 25.  For women ages 74-65, health care providers may recommend pelvic exams and Pap testing every 3 years, or they may recommend the Pap and pelvic exam, combined with testing for human papilloma virus (HPV), every 5 years. Some types of HPV increase your risk of cervical cancer. Testing for HPV may also be done on women of any age with unclear Pap test results.  Other health care providers may not recommend any screening for nonpregnant women who are considered low risk for pelvic cancer and who do not have symptoms. Ask your health care provider if a screening pelvic exam is right for you.  If you have had past treatment for cervical cancer or a condition that could lead to cancer, you need Pap tests and screening for cancer for at least 20 years after your treatment. If Pap tests have been discontinued, your risk factors (such as having a new sexual partner) need to be reassessed to determine if screening should resume. Some women have medical problems that increase the chance of getting cervical cancer. In these cases, your health care provider may recommend more frequent screening and Pap tests. Colorectal Cancer  This type of cancer can be detected and often prevented.  Routine colorectal cancer screening usually begins at 69 years of age and continues through 69 years of age.  Your health care provider may recommend screening at an earlier age if you have risk factors for colon cancer.  Your health care provider may also  recommend using home test kits to check for hidden blood in the stool.  A small camera at the end of a tube can be used to examine your colon directly (sigmoidoscopy or colonoscopy). This is done to check for the earliest forms of colorectal cancer.  Routine screening usually begins at age 69.  Direct examination of the colon should be repeated every 5-10 years through 69 years of age. However, you may need to be screened more often if early forms of precancerous polyps or small growths are found. Skin Cancer  Check your skin from head to toe regularly.  Tell your health care provider about any new moles or changes in moles, especially if there is a change in a mole's shape or color.  Also tell your health care provider if you have a mole that is larger than the size of a pencil eraser.  Always use sunscreen. Apply sunscreen liberally and repeatedly throughout the day.  Protect yourself by wearing long sleeves, pants, a wide-brimmed  hat, and sunglasses whenever you are outside. Heart disease, diabetes, and high blood pressure  High blood pressure causes heart disease and increases the risk of stroke. High blood pressure is more likely to develop in:  People who have blood pressure in the high end of the normal range (130-139/85-89 mm Hg).  People who are overweight or obese.  People who are African American.  If you are 18-59 years of age, have your blood pressure checked every 3-5 years. If you are 53 years of age or older, have your blood pressure checked every year. You should have your blood pressure measured twice-once when you are at a hospital or clinic, and once when you are not at a hospital or clinic. Record the average of the two measurements. To check your blood pressure when you are not at a hospital or clinic, you can use:  An automated blood pressure machine at a pharmacy.  A home blood pressure monitor.  If you are between 63 years and 36 years old, ask your health  care provider if you should take aspirin to prevent strokes.  Have regular diabetes screenings. This involves taking a blood sample to check your fasting blood sugar level.  If you are at a normal weight and have a low risk for diabetes, have this test once every three years after 69 years of age.  If you are overweight and have a high risk for diabetes, consider being tested at a younger age or more often. Preventing infection Hepatitis B  If you have a higher risk for hepatitis B, you should be screened for this virus. You are considered at high risk for hepatitis B if:  You were born in a country where hepatitis B is common. Ask your health care provider which countries are considered high risk.  Your parents were born in a high-risk country, and you have not been immunized against hepatitis B (hepatitis B vaccine).  You have HIV or AIDS.  You use needles to inject street drugs.  You live with someone who has hepatitis B.  You have had sex with someone who has hepatitis B.  You get hemodialysis treatment.  You take certain medicines for conditions, including cancer, organ transplantation, and autoimmune conditions. Hepatitis C  Blood testing is recommended for:  Everyone born from [birth year] through 1964.  Anyone with known risk factors for hepatitis C. Sexually transmitted infections (STIs)  You should be screened for sexually transmitted infections (STIs) including gonorrhea and chlamydia if:  You are sexually active and are younger than 69 years of age.  You are older than 69 years of age and your health care provider tells you that you are at risk for this type of infection.  Your sexual activity has changed since you were last screened and you are at an increased risk for chlamydia or gonorrhea. Ask your health care provider if you are at risk.  If you do not have HIV, but are at risk, it may be recommended that you take a prescription medicine daily to prevent HIV  infection. This is called pre-exposure prophylaxis (PrEP). You are considered at risk if:  You are sexually active and do not regularly use condoms or know the HIV status of your partner(s).  You take drugs by injection.  You are sexually active with a partner who has HIV. Talk with your health care provider about whether you are at high risk of being infected with HIV. If you choose to begin PrEP, you should first  be tested for HIV. You should then be tested every 3 months for as long as you are taking PrEP. Pregnancy  If you are premenopausal and you may become pregnant, ask your health care provider about preconception counseling.  If you may become pregnant, take 400 to 800 micrograms (mcg) of folic acid every day.  If you want to prevent pregnancy, talk to your health care provider about birth control (contraception). Osteoporosis and menopause  Osteoporosis is a disease in which the bones lose minerals and strength with aging. This can result in serious bone fractures. Your risk for osteoporosis can be identified using a bone density scan.  If you are 29 years of age or older, or if you are at risk for osteoporosis and fractures, ask your health care provider if you should be screened.  Ask your health care provider whether you should take a calcium or vitamin D supplement to lower your risk for osteoporosis.  Menopause may have certain physical symptoms and risks.  Hormone replacement therapy may reduce some of these symptoms and risks. Talk to your health care provider about whether hormone replacement therapy is right for you. Follow these instructions at home:  Schedule regular health, dental, and eye exams.  Stay current with your immunizations.  Do not use any tobacco products including cigarettes, chewing tobacco, or electronic cigarettes.  If you are pregnant, do not drink alcohol.  If you are breastfeeding, limit how much and how often you drink alcohol.  Limit  alcohol intake to no more than 1 drink per day for nonpregnant women. One drink equals 12 ounces of beer, 5 ounces of wine, or 1 ounces of hard liquor.  Do not use street drugs.  Do not share needles.  Ask your health care provider for help if you need support or information about quitting drugs.  Tell your health care provider if you often feel depressed.  Tell your health care provider if you have ever been abused or do not feel safe at home. This information is not intended to replace advice given to you by your health care provider. Make sure you discuss any questions you have with your health care provider. Document Released: 10/06/2010 Document Revised: 08/29/2015 Document Reviewed: 12/25/2014  2017 Elsevier

## 2016-03-03 NOTE — Assessment & Plan Note (Signed)
Using ibuprofen and tylenol for pain as needed although this does limit her ambulation.

## 2016-03-03 NOTE — Assessment & Plan Note (Addendum)
Given flu shot at visit. Declines shingles shot. Pneumonia series complete. Tetanus up to date. Eye exam done. Colonoscopy up to date and mammogram. Counseled on home safety and mole surveillance. Given 10 year screening recommendations. Ordered hearing evaluation due to loss on the hearing exam.

## 2016-03-03 NOTE — Progress Notes (Signed)
Pre visit review using our clinic review tool, if applicable. No additional management support is needed unless otherwise documented below in the visit note. 

## 2016-03-04 ENCOUNTER — Telehealth: Payer: Self-pay | Admitting: Internal Medicine

## 2016-03-04 NOTE — Telephone Encounter (Signed)
Patient called in requesting lab results.  Gave MD response on recent labs.

## 2016-03-06 ENCOUNTER — Encounter (HOSPITAL_BASED_OUTPATIENT_CLINIC_OR_DEPARTMENT_OTHER): Payer: PPO | Attending: Internal Medicine

## 2016-03-06 DIAGNOSIS — I10 Essential (primary) hypertension: Secondary | ICD-10-CM | POA: Insufficient documentation

## 2016-03-06 DIAGNOSIS — L97812 Non-pressure chronic ulcer of other part of right lower leg with fat layer exposed: Secondary | ICD-10-CM | POA: Insufficient documentation

## 2016-03-06 DIAGNOSIS — I87333 Chronic venous hypertension (idiopathic) with ulcer and inflammation of bilateral lower extremity: Secondary | ICD-10-CM | POA: Diagnosis not present

## 2016-03-06 DIAGNOSIS — I890 Lymphedema, not elsewhere classified: Secondary | ICD-10-CM | POA: Diagnosis not present

## 2016-03-06 DIAGNOSIS — L97821 Non-pressure chronic ulcer of other part of left lower leg limited to breakdown of skin: Secondary | ICD-10-CM | POA: Insufficient documentation

## 2016-03-06 DIAGNOSIS — E119 Type 2 diabetes mellitus without complications: Secondary | ICD-10-CM | POA: Insufficient documentation

## 2016-03-07 ENCOUNTER — Other Ambulatory Visit: Payer: Self-pay | Admitting: Internal Medicine

## 2016-03-10 DIAGNOSIS — I87333 Chronic venous hypertension (idiopathic) with ulcer and inflammation of bilateral lower extremity: Secondary | ICD-10-CM | POA: Diagnosis not present

## 2016-03-13 DIAGNOSIS — L97211 Non-pressure chronic ulcer of right calf limited to breakdown of skin: Secondary | ICD-10-CM | POA: Diagnosis not present

## 2016-03-13 DIAGNOSIS — I87333 Chronic venous hypertension (idiopathic) with ulcer and inflammation of bilateral lower extremity: Secondary | ICD-10-CM | POA: Diagnosis not present

## 2016-03-13 DIAGNOSIS — L97221 Non-pressure chronic ulcer of left calf limited to breakdown of skin: Secondary | ICD-10-CM | POA: Diagnosis not present

## 2016-03-16 DIAGNOSIS — I87333 Chronic venous hypertension (idiopathic) with ulcer and inflammation of bilateral lower extremity: Secondary | ICD-10-CM | POA: Diagnosis not present

## 2016-03-20 DIAGNOSIS — I87333 Chronic venous hypertension (idiopathic) with ulcer and inflammation of bilateral lower extremity: Secondary | ICD-10-CM | POA: Diagnosis not present

## 2016-03-20 DIAGNOSIS — I890 Lymphedema, not elsewhere classified: Secondary | ICD-10-CM | POA: Diagnosis not present

## 2016-03-20 DIAGNOSIS — L97221 Non-pressure chronic ulcer of left calf limited to breakdown of skin: Secondary | ICD-10-CM | POA: Diagnosis not present

## 2016-03-24 DIAGNOSIS — I87333 Chronic venous hypertension (idiopathic) with ulcer and inflammation of bilateral lower extremity: Secondary | ICD-10-CM | POA: Diagnosis not present

## 2016-03-27 DIAGNOSIS — I87333 Chronic venous hypertension (idiopathic) with ulcer and inflammation of bilateral lower extremity: Secondary | ICD-10-CM | POA: Diagnosis not present

## 2016-03-27 DIAGNOSIS — I890 Lymphedema, not elsewhere classified: Secondary | ICD-10-CM | POA: Diagnosis not present

## 2016-03-27 DIAGNOSIS — L97909 Non-pressure chronic ulcer of unspecified part of unspecified lower leg with unspecified severity: Secondary | ICD-10-CM | POA: Diagnosis not present

## 2016-03-27 DIAGNOSIS — L97211 Non-pressure chronic ulcer of right calf limited to breakdown of skin: Secondary | ICD-10-CM | POA: Diagnosis not present

## 2016-04-03 DIAGNOSIS — I87333 Chronic venous hypertension (idiopathic) with ulcer and inflammation of bilateral lower extremity: Secondary | ICD-10-CM | POA: Diagnosis not present

## 2016-04-03 DIAGNOSIS — L97221 Non-pressure chronic ulcer of left calf limited to breakdown of skin: Secondary | ICD-10-CM | POA: Diagnosis not present

## 2016-04-10 ENCOUNTER — Encounter (HOSPITAL_BASED_OUTPATIENT_CLINIC_OR_DEPARTMENT_OTHER): Payer: PPO | Attending: Internal Medicine

## 2016-04-10 DIAGNOSIS — I739 Peripheral vascular disease, unspecified: Secondary | ICD-10-CM | POA: Insufficient documentation

## 2016-04-10 DIAGNOSIS — Z09 Encounter for follow-up examination after completed treatment for conditions other than malignant neoplasm: Secondary | ICD-10-CM | POA: Insufficient documentation

## 2016-04-10 DIAGNOSIS — I890 Lymphedema, not elsewhere classified: Secondary | ICD-10-CM | POA: Diagnosis not present

## 2016-04-10 DIAGNOSIS — I110 Hypertensive heart disease with heart failure: Secondary | ICD-10-CM | POA: Diagnosis not present

## 2016-04-10 DIAGNOSIS — E119 Type 2 diabetes mellitus without complications: Secondary | ICD-10-CM | POA: Diagnosis not present

## 2016-04-10 DIAGNOSIS — Z872 Personal history of diseases of the skin and subcutaneous tissue: Secondary | ICD-10-CM | POA: Insufficient documentation

## 2016-04-10 DIAGNOSIS — I509 Heart failure, unspecified: Secondary | ICD-10-CM | POA: Diagnosis not present

## 2016-04-10 DIAGNOSIS — I872 Venous insufficiency (chronic) (peripheral): Secondary | ICD-10-CM | POA: Diagnosis not present

## 2016-04-10 DIAGNOSIS — L97221 Non-pressure chronic ulcer of left calf limited to breakdown of skin: Secondary | ICD-10-CM | POA: Diagnosis not present

## 2016-04-10 DIAGNOSIS — I87333 Chronic venous hypertension (idiopathic) with ulcer and inflammation of bilateral lower extremity: Secondary | ICD-10-CM | POA: Diagnosis not present

## 2016-04-10 DIAGNOSIS — S81801D Unspecified open wound, right lower leg, subsequent encounter: Secondary | ICD-10-CM | POA: Diagnosis not present

## 2016-04-10 DIAGNOSIS — L97211 Non-pressure chronic ulcer of right calf limited to breakdown of skin: Secondary | ICD-10-CM | POA: Diagnosis not present

## 2016-04-16 ENCOUNTER — Telehealth: Payer: Self-pay | Admitting: Internal Medicine

## 2016-04-16 NOTE — Telephone Encounter (Signed)
Would need visit 

## 2016-04-16 NOTE — Telephone Encounter (Signed)
Called and stated awareness and is going to call back to make appointment

## 2016-04-16 NOTE — Telephone Encounter (Signed)
Patient Name: [name] [name] DOB: [birth year] Initial Comment Caller states she got bit by her eye area, forehead. It's itching, swelling and would like something called in. Nurse Assessment Guidelines Guideline Title Affirmed Question Affirmed Notes Final Disposition User FINAL ATTEMPT MADE - no message left [place], Therapist, sports, Kermit Balo

## 2016-04-17 ENCOUNTER — Encounter: Payer: Self-pay | Admitting: Internal Medicine

## 2016-04-17 ENCOUNTER — Ambulatory Visit (INDEPENDENT_AMBULATORY_CARE_PROVIDER_SITE_OTHER): Payer: PPO | Admitting: Internal Medicine

## 2016-04-17 DIAGNOSIS — T7840XA Allergy, unspecified, initial encounter: Secondary | ICD-10-CM | POA: Diagnosis not present

## 2016-04-17 MED ORDER — HYDROCORTISONE 1 % EX OINT: 1.0000 "application " | TOPICAL_OINTMENT | Freq: Two times a day (BID) | CUTANEOUS | 0 refills | Status: DC

## 2016-04-17 MED ORDER — METFORMIN HCL 500 MG PO TABS: 500.0000 mg | ORAL_TABLET | Freq: Every day | ORAL | 3 refills | Status: DC

## 2016-04-17 NOTE — Assessment & Plan Note (Signed)
Mild reaction. She is advised to start zyrtec for 1 week and use benadryl prn for itching. Rx for hydrocortisone 1% ointment for her eyes and strongly advised not to itch as this will continue the allergic reaction and prevent healing.

## 2016-04-17 NOTE — Patient Instructions (Signed)
We will have you take benadryl as needed for the itching and the burning.   Start taking zyrtec 1 pill daily for the next week,   We have sent in a cream which is safe to use on the eyelids.

## 2016-04-17 NOTE — Progress Notes (Signed)
   Subjective:    Patient ID: [name] [name], female    DOB: 11-06-1945, 71 y.o.   MRN: [id number]  HPI The patient is a 71 YO female coming in for allergic reaction around her eyes. She had some bugs flying around her eyes and afterwards she started itching. She does not recall a bite and denies break in the skin. She has been scratching since it was itching. She has not taken benadryl or anything for it. Going on 1 week now and skin is red and dry. She feels that initially there was some swelling which has gone down some. No change to vision. No facial swelling or around her mouth or throat.   Review of Systems  Constitutional: Negative for activity change, appetite change, chills, fatigue, fever and unexpected weight change.  HENT: Positive for congestion. Negative for dental problem, drooling, ear discharge, ear pain, facial swelling, postnasal drip, rhinorrhea, sinus pain, sinus pressure, sore throat and trouble swallowing.   Eyes: Positive for redness and itching. Negative for photophobia, pain, discharge and visual disturbance.  Respiratory: Negative.   Cardiovascular: Positive for leg swelling.       Chronic  Gastrointestinal: Negative.   Musculoskeletal: Negative.       Objective:   Physical Exam  Constitutional: She appears well-developed and well-nourished. No distress.  HENT:  Head: Normocephalic and atraumatic.  Right Ear: External ear normal.  Left Ear: External ear normal.  Nose: Nose normal.  Mouth/Throat: Oropharynx is clear and moist.  No facial or oropharynx swelling.  Eyes: Conjunctivae and EOM are normal. Pupils are equal, round, and reactive to light.  Some mild swelling around the eyes and redness with dry skin and stigmata of scratching.  Neck: Normal range of motion. No JVD present.  Cardiovascular: Normal rate and regular rhythm.   Pulmonary/Chest: Effort normal and breath sounds normal.  Abdominal: Soft.  Lymphadenopathy:    She has no cervical  adenopathy.   Vitals:   04/17/16 1027  BP: (!) 162/90  Pulse: 61  Resp: 14  Temp: 98 F (36.7 C)  TempSrc: Oral  SpO2: 99%  Weight: 290 lb (131.5 kg)  Height: 5\' 11"  (1.803 m)      Assessment & Plan:

## 2016-04-17 NOTE — Progress Notes (Signed)
Pre visit review using our clinic review tool, if applicable. No additional management support is needed unless otherwise documented below in the visit note. 

## 2016-04-29 ENCOUNTER — Telehealth: Payer: Self-pay | Admitting: *Deleted

## 2016-04-29 NOTE — Telephone Encounter (Signed)
Rec'd call pt states MD rx her some ointment and was told to use zyrtec for her eyes. Pt states she still have the irritation, eye lids constantly itching. Requesting MD recommendation on what else she can take...[name] [name]

## 2016-04-30 NOTE — Telephone Encounter (Signed)
She can take benadryl over the counter for itching as well. Would she like to see a dermatologist to see if they have other suggestions?

## 2016-04-30 NOTE — Telephone Encounter (Signed)
Called pt no answer left detail msg w/MD response on pt private vm.Marland KitchenJohny Chess

## 2016-05-13 ENCOUNTER — Ambulatory Visit (INDEPENDENT_AMBULATORY_CARE_PROVIDER_SITE_OTHER): Payer: PPO | Admitting: Podiatry

## 2016-05-13 ENCOUNTER — Encounter: Payer: Self-pay | Admitting: Podiatry

## 2016-05-13 VITALS — BP 150/71 | HR 68 | Resp 18

## 2016-05-13 DIAGNOSIS — E0842 Diabetes mellitus due to underlying condition with diabetic polyneuropathy: Secondary | ICD-10-CM | POA: Diagnosis not present

## 2016-05-13 DIAGNOSIS — B351 Tinea unguium: Secondary | ICD-10-CM

## 2016-05-13 DIAGNOSIS — M79676 Pain in unspecified toe(s): Secondary | ICD-10-CM

## 2016-05-13 NOTE — Patient Instructions (Signed)

## 2016-05-13 NOTE — Progress Notes (Signed)
Patient ID: [name] [name], female   DOB: Feb 09, 1946, 71 y.o.   MRN: [id number]    Subjective: This patient presents for scheduled visit complaining of uncomfortable toenails when walking wearing shoes and request toenail debridement. Patient is undergoing compression therapy for chronic lymphedema with weekly dressings with therapy initiated approximately a week ago.  Objective: DP and PT pulses 1/4 bilaterally Sensation to 10 g monofilament wire intact 4/5 bilaterally Vibratory sensation reactive bilaterally Ankle reflexes nonreactive bilaterally HAV right Toenails are hypertrophic elongated, brittle, discolored 6-10 Bilateral lower extremity compression dressings from knees to proximal MPJs bilaterally  Assessment: Chronic lymphedema bilaterally undergoing compression therapy Diabetic peripheral neuropathy Symptomatic onychomycoses 6-10  Plan: Debridement toenails 6-10 mechanically and leg without any bleeding Defer on diabetic shoes he cause of compression dressings Indication for diabetic shoes include lymphedema bilaterally Loss of deep tendon reflexes HAV right  Reappoint 3 months

## 2016-05-28 ENCOUNTER — Ambulatory Visit: Payer: PPO | Admitting: Audiology

## 2016-08-11 ENCOUNTER — Ambulatory Visit: Payer: PPO | Admitting: Podiatry

## 2016-09-01 ENCOUNTER — Ambulatory Visit: Payer: PPO | Admitting: Internal Medicine

## 2016-09-08 ENCOUNTER — Encounter: Payer: Self-pay | Admitting: Podiatry

## 2016-09-08 ENCOUNTER — Ambulatory Visit (INDEPENDENT_AMBULATORY_CARE_PROVIDER_SITE_OTHER): Payer: PPO | Admitting: Podiatry

## 2016-09-08 DIAGNOSIS — M79676 Pain in unspecified toe(s): Secondary | ICD-10-CM

## 2016-09-08 DIAGNOSIS — E1142 Type 2 diabetes mellitus with diabetic polyneuropathy: Secondary | ICD-10-CM | POA: Diagnosis not present

## 2016-09-08 DIAGNOSIS — B351 Tinea unguium: Secondary | ICD-10-CM | POA: Diagnosis not present

## 2016-09-08 NOTE — Patient Instructions (Signed)

## 2016-09-08 NOTE — Progress Notes (Signed)
Patient ID: [name] [name], female   DOB: Jul 08, 1945, 71 y.o.   MRN: [id number]   Subjective: This patient presents for scheduled visit complaining of uncomfortable toenails when walking wearing shoes and request toenail debridement. Patient has completed compression therapy for chronic extremity edema with reoccurrences skin lesions and pending follow-up wound care. Patient requests diabetic shoes  Objective: Lower extremity peripheral edema with wraps applied bilaterally DP and PT pulses 1/4 bilaterally Sensation to 10 g monofilament wire intact 4/5 bilaterally Vibratory sensation reactive bilaterally Ankle reflexes nonreactive bilaterally HAV right Toenails are hypertrophic elongated, brittle, discolored 6-10 Bilateral lower extremity compression dressings from knees to proximal MPJs bilaterally  Assessment: Chronic lymphedema bilaterally undergoing compression therapy Diabetic peripheral neuropathy Decrease pedal pulses Symptomatic onychomycoses 6-10  Plan: Debridement toenails 6-10 mechanically and leg without any bleeding  Obtain certification for diabetic shoes Indication for diabetic shoes include lymphedema bilaterally Loss of deep tendon reflexes Decreased protective sensation Decreased posterior tibial and DP pulses bilaterally HAV right Notify patient upon receipt of certification for diabetic shoes  Reappoint 3 months

## 2016-09-15 ENCOUNTER — Ambulatory Visit: Payer: PPO | Admitting: Internal Medicine

## 2016-09-16 ENCOUNTER — Other Ambulatory Visit: Payer: Self-pay | Admitting: Internal Medicine

## 2016-10-08 ENCOUNTER — Ambulatory Visit (INDEPENDENT_AMBULATORY_CARE_PROVIDER_SITE_OTHER): Payer: PPO | Admitting: Nurse Practitioner

## 2016-10-08 ENCOUNTER — Encounter: Payer: Self-pay | Admitting: Nurse Practitioner

## 2016-10-08 VITALS — BP 142/76 | HR 80 | Temp 97.6°F | Ht 71.0 in | Wt 282.0 lb

## 2016-10-08 DIAGNOSIS — I471 Supraventricular tachycardia: Secondary | ICD-10-CM | POA: Diagnosis not present

## 2016-10-08 DIAGNOSIS — E119 Type 2 diabetes mellitus without complications: Secondary | ICD-10-CM

## 2016-10-08 DIAGNOSIS — L2089 Other atopic dermatitis: Secondary | ICD-10-CM

## 2016-10-08 DIAGNOSIS — I10 Essential (primary) hypertension: Secondary | ICD-10-CM

## 2016-10-08 LAB — POCT GLYCOSYLATED HEMOGLOBIN (HGB A1C): Hemoglobin A1C: 5.9

## 2016-10-08 MED ORDER — METOPROLOL TARTRATE 50 MG PO TABS: 50.0000 mg | ORAL_TABLET | Freq: Two times a day (BID) | ORAL | 1 refills | Status: DC

## 2016-10-08 MED ORDER — HYDROXYZINE HCL 25 MG PO TABS: 25.0000 mg | ORAL_TABLET | Freq: Every evening | ORAL | 0 refills | Status: DC | PRN

## 2016-10-08 MED ORDER — BETAMETHASONE VALERATE 0.1 % EX OINT: 1.0000 "application " | TOPICAL_OINTMENT | Freq: Two times a day (BID) | CUTANEOUS | 0 refills | Status: DC

## 2016-10-08 MED ORDER — AMLODIPINE BESYLATE 10 MG PO TABS: 10.0000 mg | ORAL_TABLET | Freq: Every day | ORAL | 1 refills | Status: DC

## 2016-10-08 MED ORDER — LOSARTAN POTASSIUM 100 MG PO TABS: 100.0000 mg | ORAL_TABLET | Freq: Every day | ORAL | 1 refills | Status: DC

## 2016-10-08 NOTE — Progress Notes (Signed)
Subjective:  Patient ID: [name] [name], female    DOB: 03-22-1946  Age: 71 y.o. MRN: [id number]  CC: Follow-up (weakness,pull muscle 2 wk ago/vitamin consult? lower leg edema--odor--see wound care/ rash on arms?)   HPI  Edema: Chronic, goes to wound care. Last seen over 36months ago. [name] [name] is hesitate about returning due to time and cost. Denies any fever of purulent draiange  Itching : chronic, no improvement with hydrocortisone, triamcinolone or bendryl. Trigger by humid weather and scented personal products.  HTN: Stable  DM: stable  Outpatient Medications Prior to Visit  Medication Sig Dispense Refill  . acetaminophen (TYLENOL) 325 MG tablet Take 2 tablets (650 mg total) by mouth every 6 (six) hours as needed for mild pain (or Fever >/= 101).    Marland Kitchen aspirin EC 81 MG tablet Take 81 mg by mouth daily with breakfast. Takes once or twice weekly    . ibuprofen (ADVIL,MOTRIN) 600 MG tablet Take 1 tablet (600 mg total) by mouth every 8 (eight) hours as needed. 15 tablet 0  . metFORMIN (GLUCOPHAGE) 500 MG tablet Take 1 tablet (500 mg total) by mouth daily with breakfast. 90 tablet 3  . neomycin-bacitracin-polymyxin (NEOSPORIN) [id number] ointment Apply after cleaning before wrapping lgs 453.9 g 6  . omeprazole (PRILOSEC) 20 MG capsule TAKE 1 CAPSULE BY MOUTH 2 TIMES DAILY BEFORE A MEAL. PATIENT TAKES AS NEEDED 60 capsule 11  . amLODipine (NORVASC) 10 MG tablet TAKE 1 TABLET BY MOUTH EVERY DAY 90 tablet 0  . hydrocortisone 1 % ointment Apply 1 application topically 2 (two) times daily. 30 g 0  . losartan (COZAAR) 100 MG tablet TAKE 1 TABLET BY MOUTH EVERY DAY 90 tablet 1  . metoprolol (LOPRESSOR) 50 MG tablet TAKE 1 TABLET BY MOUTH TWICE A DAY 180 tablet 3  . triamcinolone cream (KENALOG) 0.1 % Apply 1 application topically 2 (two) times daily. 90 g 1  . methocarbamol (ROBAXIN) 500 MG tablet Take 1 tablet (500 mg total) by mouth every 6 (six) hours as needed for muscle spasms. (Patient  not taking: Reported on 10/08/2016) 30 tablet 0   No facility-administered medications prior to visit.     ROS See HPI  Objective:  BP (!) 142/76   Pulse 80   Temp 97.6 F (36.4 C)   Ht 5\' 11"  (1.803 m)   Wt 282 lb (127.9 kg)   SpO2 99%   BMI 39.33 kg/m   BP Readings from Last 3 Encounters:  10/08/16 (!) 142/76  05/13/16 (!) 150/71  04/17/16 (!) 162/90    Wt Readings from Last 3 Encounters:  10/08/16 282 lb (127.9 kg)  04/17/16 290 lb (131.5 kg)  03/03/16 282 lb (127.9 kg)    Physical Exam  Constitutional: [name] [name] is oriented to person, place, and time. No distress.  Pulmonary/Chest: Effort normal and breath sounds normal.  Musculoskeletal: [name] [name] exhibits edema. [name] [name] exhibits no tenderness.  Neurological: [name] [name] is alert and oriented to person, place, and time.  Skin: Rash noted. No erythema.  Bilateral LE edema with serous drainage.  Scaly macular patches (diffuse) with excoriation marks.  Vitals reviewed.   Lab Results  Component Value Date   WBC 8.2 12/09/2013   HGB 9.2 (L) 12/09/2013   HCT 28.2 (L) 12/09/2013   PLT 246 12/09/2013   GLUCOSE 104 (H) 09/03/2015   CHOL 135 09/03/2015   TRIG 48.0 09/03/2015   HDL 51.80 09/03/2015   LDLCALC 73 09/03/2015   ALT 8 09/03/2015   AST 14 09/03/2015  NA 139 09/03/2015   K 3.8 09/03/2015   CL 108 09/03/2015   CREATININE 0.79 09/03/2015   BUN 15 09/03/2015   CO2 24 09/03/2015   TSH 3.93 07/28/2011   HGBA1C 5.9% 10/08/2016    Mm Digital Screening Bilateral  Result Date: 05/28/2014 CLINICAL DATA:  Screening. EXAM: DIGITAL SCREENING BILATERAL MAMMOGRAM WITH CAD COMPARISON:  None. ACR Breast Density Category b: There are scattered areas of fibroglandular density. FINDINGS: There are no findings suspicious for malignancy. Images were processed with CAD. IMPRESSION: No mammographic evidence of malignancy. A result letter of this screening mammogram will be mailed directly to the patient. RECOMMENDATION: Screening mammogram in  one year. (Code:SM-B-01Y) BI-RADS CATEGORY  1: Negative. Electronically Signed   By: Marin Olp M.D.   On: 05/28/2014 16:31    Assessment & Plan:   [name] [name] was seen today for follow-up.  Diagnoses and all orders for this visit:  Flexural atopic dermatitis -     betamethasone valerate ointment (VALISONE) 0.1 %; Apply 1 application topically 2 (two) times daily. -     hydrOXYzine (ATARAX/VISTARIL) 25 MG tablet; Take 1 tablet (25 mg total) by mouth at bedtime as needed for itching.  Type 2 diabetes mellitus without complication, without long-term current use of insulin (HCC) -     POCT HgB A1C  Paroxysmal SVT (supraventricular tachycardia) (HCC) -     metoprolol tartrate (LOPRESSOR) 50 MG tablet; Take 1 tablet (50 mg total) by mouth 2 (two) times daily.  Essential hypertension -     amLODipine (NORVASC) 10 MG tablet; Take 1 tablet (10 mg total) by mouth at bedtime. -     losartan (COZAAR) 100 MG tablet; Take 1 tablet (100 mg total) by mouth daily.   I have discontinued Ms. Geimer's triamcinolone cream and hydrocortisone. I have also changed her amLODipine, losartan, and metoprolol tartrate. Additionally, I am having her start on betamethasone valerate ointment and hydrOXYzine. Lastly, I am having her maintain her aspirin EC, acetaminophen, methocarbamol, ibuprofen, neomycin-bacitracin-polymyxin, omeprazole, and metFORMIN.  Meds ordered this encounter  Medications  . betamethasone valerate ointment (VALISONE) 0.1 %    Sig: Apply 1 application topically 2 (two) times daily.    Dispense:  45 g    Refill:  0    Order Specific Question:   Supervising Provider    Answer:   Cassandria Anger [[id number]]  . hydrOXYzine (ATARAX/VISTARIL) 25 MG tablet    Sig: Take 1 tablet (25 mg total) by mouth at bedtime as needed for itching.    Dispense:  14 tablet    Refill:  0    Order Specific Question:   Supervising Provider    Answer:   Cassandria Anger [[id number]]  . amLODipine (NORVASC) 10 MG  tablet    Sig: Take 1 tablet (10 mg total) by mouth at bedtime.    Dispense:  90 tablet    Refill:  1    Order Specific Question:   Supervising Provider    Answer:   Cassandria Anger [[id number]]  . losartan (COZAAR) 100 MG tablet    Sig: Take 1 tablet (100 mg total) by mouth daily.    Dispense:  90 tablet    Refill:  1    Order Specific Question:   Supervising Provider    Answer:   Cassandria Anger [[id number]]  . metoprolol tartrate (LOPRESSOR) 50 MG tablet    Sig: Take 1 tablet (50 mg total) by mouth 2 (two) times daily.    Dispense:  180 tablet    Refill:  1    Order Specific Question:   Supervising Provider    Answer:   Cassandria Anger [[id number]]    Follow-up: No Follow-up on file.  Wilfred Lacy, NP

## 2016-10-08 NOTE — Patient Instructions (Addendum)
Please call wound care center about led edema and smell.   Eczema Eczema, also called atopic dermatitis, is a skin disorder that causes inflammation of the skin. It causes a red rash and dry, scaly skin. The skin becomes very itchy. Eczema is generally worse during the cooler winter months and often improves with the warmth of summer. Eczema usually starts showing signs in infancy. Some children outgrow eczema, but it may last through adulthood. What are the causes? The exact cause of eczema is not known, but it appears to run in families. People with eczema often have a family history of eczema, allergies, asthma, or hay fever. Eczema is not contagious. Flare-ups of the condition may be caused by:  Contact with something you are sensitive or allergic to.  Stress.  What are the signs or symptoms?  Dry, scaly skin.  Red, itchy rash.  Itchiness. This may occur before the skin rash and may be very intense. How is this diagnosed? The diagnosis of eczema is usually made based on symptoms and medical history. How is this treated? Eczema cannot be cured, but symptoms usually can be controlled with treatment and other strategies. A treatment plan might include:  Controlling the itching and scratching. ? Use over-the-counter antihistamines as directed for itching. This is especially useful at night when the itching tends to be worse. ? Use over-the-counter steroid creams as directed for itching. ? Avoid scratching. Scratching makes the rash and itching worse. It may also result in a skin infection (impetigo) due to a break in the skin caused by scratching.  Keeping the skin well moisturized with creams every day. This will seal in moisture and help prevent dryness. Lotions that contain alcohol and water should be avoided because they can dry the skin.  Limiting exposure to things that you are sensitive or allergic to (allergens).  Recognizing situations that cause stress.  Developing a  plan to manage stress.  Follow these instructions at home:  Only take over-the-counter or prescription medicines as directed by your health care provider.  Do not use anything on the skin without checking with your health care provider.  Keep baths or showers short (5 minutes) in warm (not hot) water. Use mild cleansers for bathing. These should be unscented. You may add nonperfumed bath oil to the bath water. It is best to avoid soap and bubble bath.  Immediately after a bath or shower, when the skin is still damp, apply a moisturizing ointment to the entire body. This ointment should be a petroleum ointment. This will seal in moisture and help prevent dryness. The thicker the ointment, the better. These should be unscented.  Keep fingernails cut short. Children with eczema may need to wear soft gloves or mittens at night after applying an ointment.  Dress in clothes made of cotton or cotton blends. Dress lightly, because heat increases itching.  A child with eczema should stay away from anyone with fever blisters or cold sores. The virus that causes fever blisters (herpes simplex) can cause a serious skin infection in children with eczema. Contact a health care provider if:  Your itching interferes with sleep.  Your rash gets worse or is not better within 1 week after starting treatment.  You see pus or soft yellow scabs in the rash area.  You have a fever.  You have a rash flare-up after contact with someone who has fever blisters. This information is not intended to replace advice given to you by your health care provider. Make  sure you discuss any questions you have with your health care provider. Document Released: 03/20/2000 Document Revised: 08/29/2015 Document Reviewed: 10/24/2012 Elsevier Interactive Patient Education  2017 [employer].

## 2016-11-23 ENCOUNTER — Other Ambulatory Visit: Payer: Self-pay | Admitting: Internal Medicine

## 2016-12-08 ENCOUNTER — Encounter (INDEPENDENT_AMBULATORY_CARE_PROVIDER_SITE_OTHER): Payer: PPO | Admitting: Podiatry

## 2016-12-08 ENCOUNTER — Telehealth: Payer: Self-pay | Admitting: Podiatry

## 2016-12-08 NOTE — Telephone Encounter (Signed)
Left a voicemail for patient to call back and re-schedule her missed appointment for today, 9/4.

## 2016-12-08 NOTE — Progress Notes (Signed)
This encounter was created in error - please disregard.

## 2016-12-09 ENCOUNTER — Ambulatory Visit: Payer: PPO | Admitting: Podiatry

## 2016-12-16 DIAGNOSIS — I8311 Varicose veins of right lower extremity with inflammation: Secondary | ICD-10-CM | POA: Diagnosis not present

## 2016-12-16 DIAGNOSIS — L97221 Non-pressure chronic ulcer of left calf limited to breakdown of skin: Secondary | ICD-10-CM | POA: Diagnosis not present

## 2016-12-16 DIAGNOSIS — I8312 Varicose veins of left lower extremity with inflammation: Secondary | ICD-10-CM | POA: Diagnosis not present

## 2016-12-16 DIAGNOSIS — L97211 Non-pressure chronic ulcer of right calf limited to breakdown of skin: Secondary | ICD-10-CM | POA: Diagnosis not present

## 2016-12-16 DIAGNOSIS — I890 Lymphedema, not elsewhere classified: Secondary | ICD-10-CM | POA: Diagnosis not present

## 2016-12-22 ENCOUNTER — Encounter: Payer: Self-pay | Admitting: Podiatry

## 2016-12-22 ENCOUNTER — Ambulatory Visit (INDEPENDENT_AMBULATORY_CARE_PROVIDER_SITE_OTHER): Payer: PPO | Admitting: Podiatry

## 2016-12-22 DIAGNOSIS — E1151 Type 2 diabetes mellitus with diabetic peripheral angiopathy without gangrene: Secondary | ICD-10-CM

## 2016-12-22 DIAGNOSIS — M79676 Pain in unspecified toe(s): Secondary | ICD-10-CM | POA: Diagnosis not present

## 2016-12-22 DIAGNOSIS — B351 Tinea unguium: Secondary | ICD-10-CM

## 2016-12-22 NOTE — Progress Notes (Signed)
Patient ID: [name] [name], female   DOB: 10-20-1945, 71 y.o.   MRN: [id number]    Subjective: This patient presents for scheduled visit complaining of uncomfortable toenails when walking wearing shoes and request toenail debridement. Patient has completed compression therapy for chronic extremity edema with reoccurrences skin lesions and pending follow-up wound care. Patient requests diabetic shoes  Objective: Lower extremity peripheral edema with wraps applied bilaterally DP and PT pulses 1/4 bilaterally Sensation to 10 g monofilament wire intact 4/5 bilaterally Vibratory sensation reactive bilaterally Ankle reflexes nonreactive bilaterally HAV right Toenails are hypertrophic elongated, brittle, discolored 6-10 Bilateral lower extremity compression dressings from knees to proximal MPJs bilaterally  Assessment: Chronic lymphedema bilaterally undergoing compression therapy Diabetic peripheral neuropathy Decrease pedal pulses Symptomatic onychomycoses 6-10  Plan: Debridement toenails 6-10 mechanically and leg without any bleeding  Obtain certification for diabetic shoes pending Indication for diabetic shoes include lymphedema bilaterally Loss of deep tendon reflexes Decreased protective sensation Decreased posterior tibial and DP pulses bilaterally HAV right Notify patient upon receipt of certification for diabetic shoes  Reappoint 3 months

## 2016-12-22 NOTE — Patient Instructions (Signed)

## 2016-12-24 DIAGNOSIS — L97221 Non-pressure chronic ulcer of left calf limited to breakdown of skin: Secondary | ICD-10-CM | POA: Diagnosis not present

## 2016-12-24 DIAGNOSIS — L97211 Non-pressure chronic ulcer of right calf limited to breakdown of skin: Secondary | ICD-10-CM | POA: Diagnosis not present

## 2016-12-24 DIAGNOSIS — I890 Lymphedema, not elsewhere classified: Secondary | ICD-10-CM | POA: Diagnosis not present

## 2016-12-31 DIAGNOSIS — I890 Lymphedema, not elsewhere classified: Secondary | ICD-10-CM | POA: Diagnosis not present

## 2016-12-31 DIAGNOSIS — I83202 Varicose veins of unspecified lower extremity with both ulcer of calf and inflammation: Secondary | ICD-10-CM | POA: Diagnosis not present

## 2017-01-07 DIAGNOSIS — I890 Lymphedema, not elsewhere classified: Secondary | ICD-10-CM | POA: Diagnosis not present

## 2017-01-07 DIAGNOSIS — L97212 Non-pressure chronic ulcer of right calf with fat layer exposed: Secondary | ICD-10-CM | POA: Diagnosis not present

## 2017-01-07 DIAGNOSIS — I8311 Varicose veins of right lower extremity with inflammation: Secondary | ICD-10-CM | POA: Diagnosis not present

## 2017-01-07 DIAGNOSIS — L97211 Non-pressure chronic ulcer of right calf limited to breakdown of skin: Secondary | ICD-10-CM | POA: Diagnosis not present

## 2017-01-07 DIAGNOSIS — L97222 Non-pressure chronic ulcer of left calf with fat layer exposed: Secondary | ICD-10-CM | POA: Diagnosis not present

## 2017-01-07 DIAGNOSIS — I8312 Varicose veins of left lower extremity with inflammation: Secondary | ICD-10-CM | POA: Diagnosis not present

## 2017-01-07 DIAGNOSIS — L97221 Non-pressure chronic ulcer of left calf limited to breakdown of skin: Secondary | ICD-10-CM | POA: Diagnosis not present

## 2017-01-14 DIAGNOSIS — L97221 Non-pressure chronic ulcer of left calf limited to breakdown of skin: Secondary | ICD-10-CM | POA: Diagnosis not present

## 2017-01-14 DIAGNOSIS — I8311 Varicose veins of right lower extremity with inflammation: Secondary | ICD-10-CM | POA: Diagnosis not present

## 2017-01-14 DIAGNOSIS — L97222 Non-pressure chronic ulcer of left calf with fat layer exposed: Secondary | ICD-10-CM | POA: Diagnosis not present

## 2017-01-14 DIAGNOSIS — L97212 Non-pressure chronic ulcer of right calf with fat layer exposed: Secondary | ICD-10-CM | POA: Diagnosis not present

## 2017-01-14 DIAGNOSIS — I8312 Varicose veins of left lower extremity with inflammation: Secondary | ICD-10-CM | POA: Diagnosis not present

## 2017-01-14 DIAGNOSIS — L97211 Non-pressure chronic ulcer of right calf limited to breakdown of skin: Secondary | ICD-10-CM | POA: Diagnosis not present

## 2017-01-14 DIAGNOSIS — I890 Lymphedema, not elsewhere classified: Secondary | ICD-10-CM | POA: Diagnosis not present

## 2017-01-18 ENCOUNTER — Telehealth: Payer: Self-pay | Admitting: Emergency Medicine

## 2017-01-18 NOTE — Telephone Encounter (Signed)
Pt called and wants to know if you can give her a call back. She asked if you could send in a cream for refill but doesn't remember the name of it. Please advise thanks.

## 2017-01-18 NOTE — Telephone Encounter (Signed)
error 

## 2017-01-18 NOTE — Telephone Encounter (Signed)
Patient states that she does not have the money to keep coming in for the RX every time she has a reaction. States that it is the same thing that happened before and states that she is going to go to the pharmacy and get some hydrocortisone cream to use for now.

## 2017-01-18 NOTE — Telephone Encounter (Signed)
This is only for allergic reaction around the eyes. If there has not been an exposure (i.e. Bug bite) needs visit to evaluate.

## 2017-01-18 NOTE — Telephone Encounter (Signed)
Patient states that her eyes are swollen again and that she wanted to get a refill on the cream you prescribed before. I tried to find it to send it to you but was not on current meds. The hydrocortisone cream.

## 2017-01-18 NOTE — Telephone Encounter (Signed)
LVM for patient to call back about the cream, I think maybe she is talking about the betamethasone?

## 2017-01-21 ENCOUNTER — Encounter (HOSPITAL_BASED_OUTPATIENT_CLINIC_OR_DEPARTMENT_OTHER): Payer: PPO | Attending: Surgery

## 2017-01-21 DIAGNOSIS — L97822 Non-pressure chronic ulcer of other part of left lower leg with fat layer exposed: Secondary | ICD-10-CM | POA: Insufficient documentation

## 2017-01-21 DIAGNOSIS — L97211 Non-pressure chronic ulcer of right calf limited to breakdown of skin: Secondary | ICD-10-CM | POA: Insufficient documentation

## 2017-01-21 DIAGNOSIS — L97222 Non-pressure chronic ulcer of left calf with fat layer exposed: Secondary | ICD-10-CM | POA: Diagnosis not present

## 2017-01-21 DIAGNOSIS — L97212 Non-pressure chronic ulcer of right calf with fat layer exposed: Secondary | ICD-10-CM | POA: Diagnosis not present

## 2017-01-21 DIAGNOSIS — E11622 Type 2 diabetes mellitus with other skin ulcer: Secondary | ICD-10-CM | POA: Diagnosis not present

## 2017-01-21 DIAGNOSIS — I87313 Chronic venous hypertension (idiopathic) with ulcer of bilateral lower extremity: Secondary | ICD-10-CM | POA: Diagnosis not present

## 2017-01-21 DIAGNOSIS — I87333 Chronic venous hypertension (idiopathic) with ulcer and inflammation of bilateral lower extremity: Secondary | ICD-10-CM | POA: Insufficient documentation

## 2017-01-21 DIAGNOSIS — L97221 Non-pressure chronic ulcer of left calf limited to breakdown of skin: Secondary | ICD-10-CM | POA: Diagnosis not present

## 2017-01-21 DIAGNOSIS — I890 Lymphedema, not elsewhere classified: Secondary | ICD-10-CM | POA: Insufficient documentation

## 2017-01-28 DIAGNOSIS — L97212 Non-pressure chronic ulcer of right calf with fat layer exposed: Secondary | ICD-10-CM | POA: Diagnosis not present

## 2017-01-28 DIAGNOSIS — S81802A Unspecified open wound, left lower leg, initial encounter: Secondary | ICD-10-CM | POA: Diagnosis not present

## 2017-01-28 DIAGNOSIS — L97822 Non-pressure chronic ulcer of other part of left lower leg with fat layer exposed: Secondary | ICD-10-CM | POA: Diagnosis not present

## 2017-01-28 DIAGNOSIS — I890 Lymphedema, not elsewhere classified: Secondary | ICD-10-CM | POA: Diagnosis not present

## 2017-01-28 DIAGNOSIS — L97222 Non-pressure chronic ulcer of left calf with fat layer exposed: Secondary | ICD-10-CM | POA: Diagnosis not present

## 2017-01-28 DIAGNOSIS — I87313 Chronic venous hypertension (idiopathic) with ulcer of bilateral lower extremity: Secondary | ICD-10-CM | POA: Diagnosis not present

## 2017-01-28 DIAGNOSIS — I87333 Chronic venous hypertension (idiopathic) with ulcer and inflammation of bilateral lower extremity: Secondary | ICD-10-CM | POA: Diagnosis not present

## 2017-01-29 DIAGNOSIS — L97909 Non-pressure chronic ulcer of unspecified part of unspecified lower leg with unspecified severity: Secondary | ICD-10-CM | POA: Diagnosis not present

## 2017-02-04 ENCOUNTER — Encounter (HOSPITAL_BASED_OUTPATIENT_CLINIC_OR_DEPARTMENT_OTHER): Payer: PPO | Attending: Internal Medicine

## 2017-02-04 DIAGNOSIS — I890 Lymphedema, not elsewhere classified: Secondary | ICD-10-CM | POA: Insufficient documentation

## 2017-02-04 DIAGNOSIS — I110 Hypertensive heart disease with heart failure: Secondary | ICD-10-CM | POA: Diagnosis not present

## 2017-02-04 DIAGNOSIS — I872 Venous insufficiency (chronic) (peripheral): Secondary | ICD-10-CM | POA: Diagnosis not present

## 2017-02-04 DIAGNOSIS — Z8631 Personal history of diabetic foot ulcer: Secondary | ICD-10-CM | POA: Insufficient documentation

## 2017-02-04 DIAGNOSIS — I87303 Chronic venous hypertension (idiopathic) without complications of bilateral lower extremity: Secondary | ICD-10-CM | POA: Diagnosis not present

## 2017-02-04 DIAGNOSIS — E1151 Type 2 diabetes mellitus with diabetic peripheral angiopathy without gangrene: Secondary | ICD-10-CM | POA: Diagnosis not present

## 2017-02-04 DIAGNOSIS — L97909 Non-pressure chronic ulcer of unspecified part of unspecified lower leg with unspecified severity: Secondary | ICD-10-CM | POA: Diagnosis not present

## 2017-02-04 DIAGNOSIS — Z09 Encounter for follow-up examination after completed treatment for conditions other than malignant neoplasm: Secondary | ICD-10-CM | POA: Insufficient documentation

## 2017-02-04 DIAGNOSIS — S81801A Unspecified open wound, right lower leg, initial encounter: Secondary | ICD-10-CM | POA: Diagnosis not present

## 2017-02-04 DIAGNOSIS — S81802A Unspecified open wound, left lower leg, initial encounter: Secondary | ICD-10-CM | POA: Diagnosis not present

## 2017-02-04 DIAGNOSIS — I509 Heart failure, unspecified: Secondary | ICD-10-CM | POA: Insufficient documentation

## 2017-02-22 ENCOUNTER — Other Ambulatory Visit: Payer: Self-pay | Admitting: Internal Medicine

## 2017-02-22 DIAGNOSIS — L97221 Non-pressure chronic ulcer of left calf limited to breakdown of skin: Secondary | ICD-10-CM

## 2017-02-23 ENCOUNTER — Ambulatory Visit (INDEPENDENT_AMBULATORY_CARE_PROVIDER_SITE_OTHER)
Admission: RE | Admit: 2017-02-23 | Discharge: 2017-02-23 | Disposition: A | Discharge status: Home / Self Care | Payer: PPO | Source: Ambulatory Visit | Attending: Vascular Surgery | Admitting: Vascular Surgery

## 2017-02-23 ENCOUNTER — Ambulatory Visit (HOSPITAL_COMMUNITY)
Admission: RE | Admit: 2017-02-23 | Discharge: 2017-02-23 | Disposition: A | Discharge status: Home / Self Care | Payer: PPO | Source: Ambulatory Visit | Attending: Vascular Surgery | Admitting: Vascular Surgery

## 2017-02-23 DIAGNOSIS — L97909 Non-pressure chronic ulcer of unspecified part of unspecified lower leg with unspecified severity: Secondary | ICD-10-CM | POA: Insufficient documentation

## 2017-02-23 DIAGNOSIS — L97221 Non-pressure chronic ulcer of left calf limited to breakdown of skin: Secondary | ICD-10-CM

## 2017-02-23 DIAGNOSIS — M349 Systemic sclerosis, unspecified: Secondary | ICD-10-CM | POA: Diagnosis not present

## 2017-02-23 DIAGNOSIS — Z87891 Personal history of nicotine dependence: Secondary | ICD-10-CM | POA: Diagnosis not present

## 2017-02-23 DIAGNOSIS — I10 Essential (primary) hypertension: Secondary | ICD-10-CM | POA: Insufficient documentation

## 2017-02-23 DIAGNOSIS — E11622 Type 2 diabetes mellitus with other skin ulcer: Secondary | ICD-10-CM | POA: Insufficient documentation

## 2017-03-04 DIAGNOSIS — I890 Lymphedema, not elsewhere classified: Secondary | ICD-10-CM | POA: Diagnosis not present

## 2017-03-23 ENCOUNTER — Ambulatory Visit: Payer: PPO | Admitting: Podiatry

## 2017-04-12 ENCOUNTER — Encounter: Payer: Self-pay | Admitting: Internal Medicine

## 2017-04-12 ENCOUNTER — Ambulatory Visit (INDEPENDENT_AMBULATORY_CARE_PROVIDER_SITE_OTHER): Payer: PPO | Admitting: Internal Medicine

## 2017-04-12 ENCOUNTER — Telehealth: Payer: Self-pay | Admitting: Internal Medicine

## 2017-04-12 ENCOUNTER — Other Ambulatory Visit (INDEPENDENT_AMBULATORY_CARE_PROVIDER_SITE_OTHER): Payer: PPO

## 2017-04-12 VITALS — BP 134/82 | HR 60 | Temp 97.7°F | Ht 71.0 in | Wt 305.0 lb

## 2017-04-12 DIAGNOSIS — E119 Type 2 diabetes mellitus without complications: Secondary | ICD-10-CM | POA: Diagnosis not present

## 2017-04-12 DIAGNOSIS — I10 Essential (primary) hypertension: Secondary | ICD-10-CM | POA: Diagnosis not present

## 2017-04-12 DIAGNOSIS — Z0000 Encounter for general adult medical examination without abnormal findings: Secondary | ICD-10-CM

## 2017-04-12 DIAGNOSIS — I471 Supraventricular tachycardia: Secondary | ICD-10-CM

## 2017-04-12 DIAGNOSIS — I878 Other specified disorders of veins: Secondary | ICD-10-CM | POA: Diagnosis not present

## 2017-04-12 LAB — CBC
HCT: 30.6 % — ABNORMAL LOW (ref 36.0–46.0)
Hemoglobin: 9.4 g/dL — ABNORMAL LOW (ref 12.0–15.0)
MCHC: 30.7 g/dL (ref 30.0–36.0)
MCV: 72.7 fl — ABNORMAL LOW (ref 78.0–100.0)
Platelets: 347 10*3/uL (ref 150.0–400.0)
RBC: 4.21 Mil/uL (ref 3.87–5.11)
RDW: 19.1 % — ABNORMAL HIGH (ref 11.5–15.5)
WBC: 5.7 10*3/uL (ref 4.0–10.5)

## 2017-04-12 LAB — LIPID PANEL
Cholesterol: 138 mg/dL (ref 0–200)
HDL: 60.3 mg/dL (ref >39.00)
LDL Cholesterol: 68 mg/dL (ref 0–99)
NonHDL: 77.89
Total CHOL/HDL Ratio: 2
Triglycerides: 50 mg/dL (ref 0.0–149.0)
VLDL: 10 mg/dL (ref 0.0–40.0)

## 2017-04-12 LAB — COMPREHENSIVE METABOLIC PANEL
ALT: 7 U/L (ref 0–35)
AST: 15 U/L (ref 0–37)
Albumin: 3.8 g/dL (ref 3.5–5.2)
Alkaline Phosphatase: 71 U/L (ref 39–117)
BUN: 17 mg/dL (ref 6–23)
CO2: 25 mEq/L (ref 19–32)
Calcium: 9 mg/dL (ref 8.4–10.5)
Chloride: 106 mEq/L (ref 96–112)
Creatinine, Ser: 0.93 mg/dL (ref 0.40–1.20)
GFR: 76.54 mL/min (ref >60.00)
Glucose, Bld: 112 mg/dL — ABNORMAL HIGH (ref 70–99)
Potassium: 4.1 mEq/L (ref 3.5–5.1)
Sodium: 138 mEq/L (ref 135–145)
Total Bilirubin: 0.3 mg/dL (ref 0.2–1.2)
Total Protein: 8.8 g/dL — ABNORMAL HIGH (ref 6.0–8.3)

## 2017-04-12 LAB — HEMOGLOBIN A1C: Hgb A1c MFr Bld: 6.6 % — ABNORMAL HIGH (ref 4.6–6.5)

## 2017-04-12 MED ORDER — MONTELUKAST SODIUM 10 MG PO TABS: 10.0000 mg | ORAL_TABLET | Freq: Every day | ORAL | 3 refills | Status: DC

## 2017-04-12 MED ORDER — METHOCARBAMOL 500 MG PO TABS: 500.0000 mg | ORAL_TABLET | Freq: Four times a day (QID) | ORAL | 0 refills | Status: DC | PRN

## 2017-04-12 MED ORDER — METOPROLOL TARTRATE 50 MG PO TABS: 50.0000 mg | ORAL_TABLET | Freq: Two times a day (BID) | ORAL | 1 refills | Status: DC

## 2017-04-12 NOTE — Telephone Encounter (Signed)
LVM informing patient of MD response  

## 2017-04-12 NOTE — Telephone Encounter (Signed)
Tylenol is the recommended medication for arthritis. Can take 1-2 pills up to 3 times per day up to 3000 mg maximum per day.

## 2017-04-12 NOTE — Assessment & Plan Note (Signed)
Taking amlodipine, metoprolol, losartan with BP at goal. Checking CMP and adjust as needed.

## 2017-04-12 NOTE — Assessment & Plan Note (Signed)
Flu and tetanus and pneumonia up to date. Foot exam done today. Needs eye exam. Needs mammogram and she is not interested today. Declines bone density as she has had in the past.

## 2017-04-12 NOTE — Telephone Encounter (Signed)
Copied from [place]. Topic: Quick Communication - See Telephone Encounter >> Apr 12, 2017 11:01 AM Bea Graff, NT wrote: CRM for notification. See Telephone encounter for: Pt wants to see if something for arthritis can be called into [facility] on [address]. Stiffness in hands and knees.  04/12/17.

## 2017-04-12 NOTE — Assessment & Plan Note (Signed)
Using walker due to edema, she is not clear as to the cause. Wants to know if this is permanent, and advised that after so many years of the swelling this is likely to cause her to have chronic swelling regardless of initial cause.

## 2017-04-12 NOTE — Assessment & Plan Note (Addendum)
Weight is increased from prior and she is having swelling which limits her physical activity. She has been overeating for the holidays and will attempt to lose 10 pounds in the next 6 months. On metformin for weight control.

## 2017-04-12 NOTE — Telephone Encounter (Signed)
Patient was seen in the office at 8:00 today- she is requesting something for arthritis stiffness.

## 2017-04-12 NOTE — Patient Instructions (Signed)
We will check the labs today, remember to get the eye exam done.  Health Maintenance, Female Adopting a healthy lifestyle and getting preventive care can go a long way to promote health and wellness. Talk with your health care provider about what schedule of regular examinations is right for you. This is a good chance for you to check in with your provider about disease prevention and staying healthy. In between checkups, there are plenty of things you can do on your own. Experts have done a lot of research about which lifestyle changes and preventive measures are most likely to keep you healthy. Ask your health care provider for more information. Weight and diet Eat a healthy diet  Be sure to include plenty of vegetables, fruits, low-fat dairy products, and lean protein.  Do not eat a lot of foods high in solid fats, added sugars, or salt.  Get regular exercise. This is one of the most important things you can do for your health. ? Most adults should exercise for at least 150 minutes each week. The exercise should increase your heart rate and make you sweat (moderate-intensity exercise). ? Most adults should also do strengthening exercises at least twice a week. This is in addition to the moderate-intensity exercise.  Maintain a healthy weight  Body mass index (BMI) is a measurement that can be used to identify possible weight problems. It estimates body fat based on height and weight. Your health care provider can help determine your BMI and help you achieve or maintain a healthy weight.  For females 82 years of age and older: ? A BMI below 18.5 is considered underweight. ? A BMI of 18.5 to 24.9 is normal. ? A BMI of 25 to 29.9 is considered overweight. ? A BMI of 30 and above is considered obese.  Watch levels of cholesterol and blood lipids  You should start having your blood tested for lipids and cholesterol at 71 years of age, then have this test every 5 years.  You may need to  have your cholesterol levels checked more often if: ? Your lipid or cholesterol levels are high. ? You are older than 71 years of age. ? You are at high risk for heart disease.  Cancer screening Lung Cancer  Lung cancer screening is recommended for adults 29-78 years old who are at high risk for lung cancer because of a history of smoking.  A yearly low-dose CT scan of the lungs is recommended for people who: ? Currently smoke. ? Have quit within the past 15 years. ? Have at least a 30-pack-year history of smoking. A pack year is smoking an average of one pack of cigarettes a day for 1 year.  Yearly screening should continue until it has been 15 years since you quit.  Yearly screening should stop if you develop a health problem that would prevent you from having lung cancer treatment.  Breast Cancer  Practice breast self-awareness. This means understanding how your breasts normally appear and feel.  It also means doing regular breast self-exams. Let your health care provider know about any changes, no matter how small.  If you are in your 20s or 30s, you should have a clinical breast exam (CBE) by a health care provider every 1-3 years as part of a regular health exam.  If you are 31 or older, have a CBE every year. Also consider having a breast X-ray (mammogram) every year.  If you have a family history of breast cancer, talk to  your health care provider about genetic screening.  If you are at high risk for breast cancer, talk to your health care provider about having an MRI and a mammogram every year.  Breast cancer gene (BRCA) assessment is recommended for women who have family members with BRCA-related cancers. BRCA-related cancers include: ? Breast. ? Ovarian. ? Tubal. ? Peritoneal cancers.  Results of the assessment will determine the need for genetic counseling and BRCA1 and BRCA2 testing.  Cervical Cancer Your health care provider may recommend that you be screened  regularly for cancer of the pelvic organs (ovaries, uterus, and vagina). This screening involves a pelvic examination, including checking for microscopic changes to the surface of your cervix (Pap test). You may be encouraged to have this screening done every 3 years, beginning at age 80.  For women ages 9-65, health care providers may recommend pelvic exams and Pap testing every 3 years, or they may recommend the Pap and pelvic exam, combined with testing for human papilloma virus (HPV), every 5 years. Some types of HPV increase your risk of cervical cancer. Testing for HPV may also be done on women of any age with unclear Pap test results.  Other health care providers may not recommend any screening for nonpregnant women who are considered low risk for pelvic cancer and who do not have symptoms. Ask your health care provider if a screening pelvic exam is right for you.  If you have had past treatment for cervical cancer or a condition that could lead to cancer, you need Pap tests and screening for cancer for at least 20 years after your treatment. If Pap tests have been discontinued, your risk factors (such as having a new sexual partner) need to be reassessed to determine if screening should resume. Some women have medical problems that increase the chance of getting cervical cancer. In these cases, your health care provider may recommend more frequent screening and Pap tests.  Colorectal Cancer  This type of cancer can be detected and often prevented.  Routine colorectal cancer screening usually begins at 71 years of age and continues through 71 years of age.  Your health care provider may recommend screening at an earlier age if you have risk factors for colon cancer.  Your health care provider may also recommend using home test kits to check for hidden blood in the stool.  A small camera at the end of a tube can be used to examine your colon directly (sigmoidoscopy or colonoscopy). This is  done to check for the earliest forms of colorectal cancer.  Routine screening usually begins at age 45.  Direct examination of the colon should be repeated every 5-10 years through 71 years of age. However, you may need to be screened more often if early forms of precancerous polyps or small growths are found.  Skin Cancer  Check your skin from head to toe regularly.  Tell your health care provider about any new moles or changes in moles, especially if there is a change in a mole's shape or color.  Also tell your health care provider if you have a mole that is larger than the size of a pencil eraser.  Always use sunscreen. Apply sunscreen liberally and repeatedly throughout the day.  Protect yourself by wearing long sleeves, pants, a wide-brimmed hat, and sunglasses whenever you are outside.  Heart disease, diabetes, and high blood pressure  High blood pressure causes heart disease and increases the risk of stroke. High blood pressure is more likely  to develop in: ? People who have blood pressure in the high end of the normal range (130-139/85-89 mm Hg). ? People who are overweight or obese. ? People who are African American.  If you are 41-24 years of age, have your blood pressure checked every 3-5 years. If you are 31 years of age or older, have your blood pressure checked every year. You should have your blood pressure measured twice-once when you are at a hospital or clinic, and once when you are not at a hospital or clinic. Record the average of the two measurements. To check your blood pressure when you are not at a hospital or clinic, you can use: ? An automated blood pressure machine at a pharmacy. ? A home blood pressure monitor.  If you are between 45 years and 72 years old, ask your health care provider if you should take aspirin to prevent strokes.  Have regular diabetes screenings. This involves taking a blood sample to check your fasting blood sugar level. ? If you are  at a normal weight and have a low risk for diabetes, have this test once every three years after 71 years of age. ? If you are overweight and have a high risk for diabetes, consider being tested at a younger age or more often. Preventing infection Hepatitis B  If you have a higher risk for hepatitis B, you should be screened for this virus. You are considered at high risk for hepatitis B if: ? You were born in a country where hepatitis B is common. Ask your health care provider which countries are considered high risk. ? Your parents were born in a high-risk country, and you have not been immunized against hepatitis B (hepatitis B vaccine). ? You have HIV or AIDS. ? You use needles to inject street drugs. ? You live with someone who has hepatitis B. ? You have had sex with someone who has hepatitis B. ? You get hemodialysis treatment. ? You take certain medicines for conditions, including cancer, organ transplantation, and autoimmune conditions.  Hepatitis C  Blood testing is recommended for: ? Everyone born from [birth year] through 1964. ? Anyone with known risk factors for hepatitis C.  Sexually transmitted infections (STIs)  You should be screened for sexually transmitted infections (STIs) including gonorrhea and chlamydia if: ? You are sexually active and are younger than 71 years of age. ? You are older than 71 years of age and your health care provider tells you that you are at risk for this type of infection. ? Your sexual activity has changed since you were last screened and you are at an increased risk for chlamydia or gonorrhea. Ask your health care provider if you are at risk.  If you do not have HIV, but are at risk, it may be recommended that you take a prescription medicine daily to prevent HIV infection. This is called pre-exposure prophylaxis (PrEP). You are considered at risk if: ? You are sexually active and do not regularly use condoms or know the HIV status of your  partner(s). ? You take drugs by injection. ? You are sexually active with a partner who has HIV.  Talk with your health care provider about whether you are at high risk of being infected with HIV. If you choose to begin PrEP, you should first be tested for HIV. You should then be tested every 3 months for as long as you are taking PrEP. Pregnancy  If you are premenopausal and you may become  pregnant, ask your health care provider about preconception counseling.  If you may become pregnant, take 400 to 800 micrograms (mcg) of folic acid every day.  If you want to prevent pregnancy, talk to your health care provider about birth control (contraception). Osteoporosis and menopause  Osteoporosis is a disease in which the bones lose minerals and strength with aging. This can result in serious bone fractures. Your risk for osteoporosis can be identified using a bone density scan.  If you are 72 years of age or older, or if you are at risk for osteoporosis and fractures, ask your health care provider if you should be screened.  Ask your health care provider whether you should take a calcium or vitamin D supplement to lower your risk for osteoporosis.  Menopause may have certain physical symptoms and risks.  Hormone replacement therapy may reduce some of these symptoms and risks. Talk to your health care provider about whether hormone replacement therapy is right for you. Follow these instructions at home:  Schedule regular health, dental, and eye exams.  Stay current with your immunizations.  Do not use any tobacco products including cigarettes, chewing tobacco, or electronic cigarettes.  If you are pregnant, do not drink alcohol.  If you are breastfeeding, limit how much and how often you drink alcohol.  Limit alcohol intake to no more than 1 drink per day for nonpregnant women. One drink equals 12 ounces of beer, 5 ounces of wine, or 1 ounces of hard liquor.  Do not use street  drugs.  Do not share needles.  Ask your health care provider for help if you need support or information about quitting drugs.  Tell your health care provider if you often feel depressed.  Tell your health care provider if you have ever been abused or do not feel safe at home. This information is not intended to replace advice given to you by your health care provider. Make sure you discuss any questions you have with your health care provider. Document Released: 10/06/2010 Document Revised: 08/29/2015 Document Reviewed: 12/25/2014 Elsevier Interactive Patient Education  [employer].

## 2017-04-12 NOTE — Assessment & Plan Note (Addendum)
Refilled metoprolol, no flares recently. Resting HR 60 today.

## 2017-04-12 NOTE — Assessment & Plan Note (Addendum)
Checking HgA1c, taking metformin 500 mg daily and last HgA1c 5.9. Not complicated at this time. Overdue for eye exam and reminded to return for that. Foot exam done. Taking ARB and checking lipid panel as well. Adjust as needed.

## 2017-04-12 NOTE — Progress Notes (Signed)
   Subjective:    Patient ID: [name] [name], female    DOB: 07/20/1945, 72 y.o.   MRN: [id number]  HPI The patient is a 72 YO female coming in for physical. No new concerns. Some mild congestion and allergies.   PMH, [facility], social history reviewed and updated.   Review of Systems  Constitutional: Positive for activity change and appetite change. Negative for chills, fatigue, fever and unexpected weight change.  HENT: Positive for congestion.   Eyes: Negative.   Respiratory: Negative for cough, chest tightness and shortness of breath.   Cardiovascular: Positive for leg swelling. Negative for chest pain and palpitations.  Gastrointestinal: Negative for abdominal distention, abdominal pain, constipation, diarrhea, nausea and vomiting.  Musculoskeletal: Positive for arthralgias, back pain and gait problem.  Skin: Negative.   Neurological: Negative for dizziness, seizures, facial asymmetry, speech difficulty, weakness and numbness.  Psychiatric/Behavioral: Negative.       Objective:   Physical Exam  Constitutional: She is oriented to person, place, and time. She appears well-developed and well-nourished.  HENT:  Head: Normocephalic and atraumatic.  Oropharynx with clear drainage and redness.  Eyes: EOM are normal.  Neck: Normal range of motion.  Cardiovascular: Normal rate and regular rhythm.  Pulmonary/Chest: Effort normal and breath sounds normal. No respiratory distress. She has no wheezes. She has no rales.  Abdominal: Soft. Bowel sounds are normal. She exhibits no distension. There is no tenderness. There is no rebound.  Musculoskeletal: She exhibits edema.  Edema stable to thigh region bilaterally 3+  Neurological: She is alert and oriented to person, place, and time. Coordination abnormal.  Wheeled walker for ambulation  Skin: Skin is warm and dry.  Psychiatric: She has a normal mood and affect.   Vitals:   04/12/17 0755  BP: 134/82  Pulse: 60  Temp: 97.7 F (36.5 C)    TempSrc: Oral  SpO2: 100%  Weight: (!) 305 lb (138.3 kg)  Height: 5\' 11"  (1.803 m)      Assessment & Plan:

## 2017-04-14 NOTE — Telephone Encounter (Signed)
Patient returned call and message was given to her regarding the Tylenol per [place].  Tylenol is the recommended medication for arthritis. Can take 1-2 pills up to 3 times per day up to 3000 mg maximum per day.  Pt voiced understanding.

## 2017-04-14 NOTE — Telephone Encounter (Signed)
Noted  

## 2017-04-20 ENCOUNTER — Ambulatory Visit: Payer: PPO | Admitting: Podiatry

## 2017-04-21 ENCOUNTER — Encounter (HOSPITAL_BASED_OUTPATIENT_CLINIC_OR_DEPARTMENT_OTHER): Payer: PPO | Attending: Internal Medicine

## 2017-04-21 DIAGNOSIS — I10 Essential (primary) hypertension: Secondary | ICD-10-CM | POA: Insufficient documentation

## 2017-04-21 DIAGNOSIS — I890 Lymphedema, not elsewhere classified: Secondary | ICD-10-CM | POA: Diagnosis not present

## 2017-04-21 DIAGNOSIS — I87333 Chronic venous hypertension (idiopathic) with ulcer and inflammation of bilateral lower extremity: Secondary | ICD-10-CM | POA: Diagnosis not present

## 2017-04-21 DIAGNOSIS — L97812 Non-pressure chronic ulcer of other part of right lower leg with fat layer exposed: Secondary | ICD-10-CM | POA: Diagnosis not present

## 2017-04-21 DIAGNOSIS — L97212 Non-pressure chronic ulcer of right calf with fat layer exposed: Secondary | ICD-10-CM | POA: Diagnosis not present

## 2017-04-21 DIAGNOSIS — E11622 Type 2 diabetes mellitus with other skin ulcer: Secondary | ICD-10-CM | POA: Diagnosis not present

## 2017-04-21 DIAGNOSIS — L97222 Non-pressure chronic ulcer of left calf with fat layer exposed: Secondary | ICD-10-CM | POA: Diagnosis not present

## 2017-04-21 DIAGNOSIS — L97322 Non-pressure chronic ulcer of left ankle with fat layer exposed: Secondary | ICD-10-CM | POA: Diagnosis not present

## 2017-04-21 DIAGNOSIS — L97822 Non-pressure chronic ulcer of other part of left lower leg with fat layer exposed: Secondary | ICD-10-CM | POA: Insufficient documentation

## 2017-04-21 DIAGNOSIS — Z7984 Long term (current) use of oral hypoglycemic drugs: Secondary | ICD-10-CM | POA: Insufficient documentation

## 2017-04-21 DIAGNOSIS — I872 Venous insufficiency (chronic) (peripheral): Secondary | ICD-10-CM | POA: Diagnosis not present

## 2017-04-27 ENCOUNTER — Ambulatory Visit: Payer: PPO | Admitting: Sports Medicine

## 2017-04-27 ENCOUNTER — Encounter: Payer: Self-pay | Admitting: Sports Medicine

## 2017-04-27 DIAGNOSIS — E1151 Type 2 diabetes mellitus with diabetic peripheral angiopathy without gangrene: Secondary | ICD-10-CM

## 2017-04-27 DIAGNOSIS — M79676 Pain in unspecified toe(s): Secondary | ICD-10-CM

## 2017-04-27 DIAGNOSIS — E1142 Type 2 diabetes mellitus with diabetic polyneuropathy: Secondary | ICD-10-CM

## 2017-04-27 DIAGNOSIS — B351 Tinea unguium: Secondary | ICD-10-CM | POA: Diagnosis not present

## 2017-04-27 NOTE — Progress Notes (Signed)
Subjective: [name] [name] is a 72 y.o. female patient with history of diabetes who presents to office today complaining of long, painful nails  while ambulating in shoes; unable to trim. Patient states that the glucose reading this morning was "good". Patient denies any new changes in medication or new problems. Reports 2-3 year history of legs ulcers with smell and is seeing wound care center; has appointment on tomorrow. Patient denies any other constitutitonal symptoms.    Patient Active Problem List   Diagnosis Date Noted  . Routine general medical examination at a health care facility 05/10/2014  . Paroxysmal SVT (supraventricular tachycardia) ([place]) 11/28/2013  . Physical deconditioning 11/23/2013  . Multilevel spine pain 11/17/2013  . Aortic stenosis, mild 11/17/2013  . DM type 2 (diabetes mellitus, type 2) ([place]) 02/02/2013  . Arthritis 10/19/2012  . Mixed incontinence 10/19/2012  . GERD (gastroesophageal reflux disease) 04/20/2011  . Venous stasis of lower extremity 01/20/2011  . HTN (hypertension) 01/20/2011  . Morbid obesity ([place])    Current Outpatient Medications on File Prior to Visit  Medication Sig Dispense Refill  . acetaminophen (TYLENOL) 325 MG tablet Take 2 tablets (650 mg total) by mouth every 6 (six) hours as needed for mild pain (or Fever >/= 101).    Marland Kitchen amLODipine (NORVASC) 10 MG tablet Take 1 tablet (10 mg total) by mouth at bedtime. 90 tablet 1  . aspirin EC 81 MG tablet Take 81 mg by mouth daily with breakfast. Takes once or twice weekly    . betamethasone valerate ointment (VALISONE) 0.1 % Apply 1 application topically 2 (two) times daily. 45 g 0  . hydrOXYzine (ATARAX/VISTARIL) 25 MG tablet Take 1 tablet (25 mg total) by mouth at bedtime as needed for itching. 14 tablet 0  . ibuprofen (ADVIL,MOTRIN) 600 MG tablet Take 1 tablet (600 mg total) by mouth every 8 (eight) hours as needed. 15 tablet 0  . losartan (COZAAR) 100 MG tablet Take 1 tablet (100 mg total) by  mouth daily. 90 tablet 1  . metFORMIN (GLUCOPHAGE) 500 MG tablet Take 1 tablet (500 mg total) by mouth daily with breakfast. 90 tablet 3  . methocarbamol (ROBAXIN) 500 MG tablet Take 1 tablet (500 mg total) by mouth every 6 (six) hours as needed for muscle spasms. 30 tablet 0  . metoprolol tartrate (LOPRESSOR) 50 MG tablet Take 1 tablet (50 mg total) by mouth 2 (two) times daily. 180 tablet 1  . montelukast (SINGULAIR) 10 MG tablet Take 1 tablet (10 mg total) by mouth at bedtime. 90 tablet 3  . Neomycin-Bacitracin-Polymyxin ([facility] ANTIBIOTIC) [phone] OINT APPLY AFTER CLEANING BEFORE WRAPPING LGS 453.9 g 0  . omeprazole (PRILOSEC) 20 MG capsule TAKE 1 CAPSULE BY MOUTH 2 TIMES DAILY BEFORE A MEAL. PATIENT TAKES AS NEEDED 60 capsule 11   No current facility-administered medications on file prior to visit.    Allergies  Allergen Reactions  . Oxycodone Hcl Nausea And Vomiting  . Penicillins Itching    Recent Results (from the past 2160 hour(s))  CBC     Status: Abnormal   Collection Time: 04/12/17  8:25 AM  Result Value Ref Range   WBC 5.7 4.0 - 10.5 K/uL   RBC 4.21 3.87 - 5.11 Mil/uL   Platelets 347.0 150.0 - 400.0 K/uL   Hemoglobin 9.4 (L) 12.0 - 15.0 g/dL   HCT 30.6 (L) 36.0 - 46.0 %   MCV 72.7 (L) 78.0 - 100.0 fl   MCHC 30.7 30.0 - 36.0 g/dL   RDW 19.1 (  H) 11.5 - 15.5 %  Comprehensive metabolic panel     Status: Abnormal   Collection Time: 04/12/17  8:25 AM  Result Value Ref Range   Sodium 138 135 - 145 mEq/L   Potassium 4.1 3.5 - 5.1 mEq/L   Chloride 106 96 - 112 mEq/L   CO2 25 19 - 32 mEq/L   Glucose, Bld 112 (H) 70 - 99 mg/dL   BUN 17 6 - 23 mg/dL   Creatinine, Ser 0.93 0.40 - 1.20 mg/dL   Total Bilirubin 0.3 0.2 - 1.2 mg/dL   Alkaline Phosphatase 71 39 - 117 U/L   AST 15 0 - 37 U/L   ALT 7 0 - 35 U/L   Total Protein 8.8 (H) 6.0 - 8.3 g/dL   Albumin 3.8 3.5 - 5.2 g/dL   Calcium 9.0 8.4 - 10.5 mg/dL   GFR 76.54 >60.00 mL/min  Lipid panel     Status: None    Collection Time: 04/12/17  8:25 AM  Result Value Ref Range   Cholesterol 138 0 - 200 mg/dL    Comment: ATP III Classification       Desirable:  < 200 mg/dL               Borderline High:  200 - 239 mg/dL          High:  > = 240 mg/dL   Triglycerides 50.0 0.0 - 149.0 mg/dL    Comment: Normal:  <150 mg/dLBorderline High:  150 - 199 mg/dL   HDL 60.30 >39.00 mg/dL   VLDL 10.0 0.0 - 40.0 mg/dL   LDL Cholesterol 68 0 - 99 mg/dL   Total CHOL/HDL Ratio 2     Comment:                Men          Women1/2 Average Risk     3.4          3.3Average Risk          5.0          4.42X Average Risk          9.6          7.13X Average Risk          15.0          11.0                       NonHDL 77.89     Comment: NOTE:  Non-HDL goal should be 30 mg/dL higher than patient's LDL goal (i.e. LDL goal of < 70 mg/dL, would have non-HDL goal of < 100 mg/dL)  Hemoglobin A1c     Status: Abnormal   Collection Time: 04/12/17  8:25 AM  Result Value Ref Range   Hgb A1c MFr Bld 6.6 (H) 4.6 - 6.5 %    Comment: Glycemic Control Guidelines for People with Diabetes:Non Diabetic:  <6%Goal of Therapy: <7%Additional Action Suggested:  >8%     Objective: General: Patient is awake, alert, and oriented x 3 and in no acute distress.  Integument: Skin is warm, dry and supple bilateral. Nails are tender, long, thickened and  dystrophic with subungual debris, consistent with onychomycosis, 1-5 bilateral. No signs of infection. Unna boots bilateral. Remaining integument unremarkable.  Vasculature:  Dorsalis Pedis pulse o/4 bilateral. Posterior Tibial pulse  0/4 bilateral.  Capillary fill time <5 sec 1-5 bilateral. Lymphedema bilateral.   Neurology: The patient has absent sensation  measured with a 5.07/10 g Semmes Weinstein Monofilament at all pedal sites bilateral . Vibratory sensation absent bilateral with tuning fork. No Babinski sign present bilateral.   Musculoskeletal:Asymptomatic bunion and pes planus pedal deformities noted  bilateral. Muscular strength 5/5 in all lower extremity muscular groups bilateral without pain on range of motion . No tenderness with calf compression bilateral.  Assessment and Plan: Problem List Items Addressed This Visit    None    Visit Diagnoses    Mycotic toenails    -  Primary   Pain of toe, unspecified laterality       Diabetic peripheral vascular disease ([place])       Diabetic peripheral neuropathy ([place])          -Examined patient. -Discussed and educated patient on diabetic foot care, especially with  regards to the vascular, neurological and musculoskeletal systems.  -Stressed the importance of good glycemic control and the detriment of not  controlling glucose levels in relation to the foot. -Mechanically debrided all nails 1-5 bilateral using sterile nail nipper and filed with dremel without incident  -Patient desires diabetic shoes, scheduling message sent to [facility]; office to contact primary care for approval / certification;  Office to arrange shoe fitting and dispensing. -Answered all patient questions -Patient to return  in 3 months for at risk foot care -Patient advised to call the office if any problems or questions arise in the meantime.  Landis Martins, DPM

## 2017-04-28 DIAGNOSIS — I890 Lymphedema, not elsewhere classified: Secondary | ICD-10-CM | POA: Diagnosis not present

## 2017-04-28 DIAGNOSIS — I87333 Chronic venous hypertension (idiopathic) with ulcer and inflammation of bilateral lower extremity: Secondary | ICD-10-CM | POA: Diagnosis not present

## 2017-04-28 DIAGNOSIS — L97222 Non-pressure chronic ulcer of left calf with fat layer exposed: Secondary | ICD-10-CM | POA: Diagnosis not present

## 2017-04-28 DIAGNOSIS — L97822 Non-pressure chronic ulcer of other part of left lower leg with fat layer exposed: Secondary | ICD-10-CM | POA: Diagnosis not present

## 2017-04-28 DIAGNOSIS — L97329 Non-pressure chronic ulcer of left ankle with unspecified severity: Secondary | ICD-10-CM | POA: Diagnosis not present

## 2017-04-28 DIAGNOSIS — I872 Venous insufficiency (chronic) (peripheral): Secondary | ICD-10-CM | POA: Diagnosis not present

## 2017-04-28 DIAGNOSIS — L97212 Non-pressure chronic ulcer of right calf with fat layer exposed: Secondary | ICD-10-CM | POA: Diagnosis not present

## 2017-05-03 ENCOUNTER — Ambulatory Visit: Payer: PPO | Admitting: Orthotics

## 2017-05-03 DIAGNOSIS — E0842 Diabetes mellitus due to underlying condition with diabetic polyneuropathy: Secondary | ICD-10-CM

## 2017-05-03 DIAGNOSIS — M2142 Flat foot [pes planus] (acquired), left foot: Secondary | ICD-10-CM

## 2017-05-03 DIAGNOSIS — M2141 Flat foot [pes planus] (acquired), right foot: Secondary | ICD-10-CM

## 2017-05-03 DIAGNOSIS — E1151 Type 2 diabetes mellitus with diabetic peripheral angiopathy without gangrene: Secondary | ICD-10-CM

## 2017-05-03 DIAGNOSIS — E1142 Type 2 diabetes mellitus with diabetic polyneuropathy: Secondary | ICD-10-CM

## 2017-05-03 NOTE — Progress Notes (Signed)
Patient came in today for fitting and eval for diabetic shoes: Patient' doctor here is Dr Gladstone Pih  Patient presents with Dm2, PN, HAV, HCC, Pes Planus  Patient was measured with brannock 13 xw device and cast in foam for custom inserts.  Patient chose [id number]

## 2017-05-04 ENCOUNTER — Other Ambulatory Visit: Payer: Self-pay | Admitting: Cardiology

## 2017-05-05 DIAGNOSIS — I872 Venous insufficiency (chronic) (peripheral): Secondary | ICD-10-CM | POA: Diagnosis not present

## 2017-05-05 DIAGNOSIS — L97222 Non-pressure chronic ulcer of left calf with fat layer exposed: Secondary | ICD-10-CM | POA: Diagnosis not present

## 2017-05-05 DIAGNOSIS — I87333 Chronic venous hypertension (idiopathic) with ulcer and inflammation of bilateral lower extremity: Secondary | ICD-10-CM | POA: Diagnosis not present

## 2017-05-05 DIAGNOSIS — L97212 Non-pressure chronic ulcer of right calf with fat layer exposed: Secondary | ICD-10-CM | POA: Diagnosis not present

## 2017-05-05 DIAGNOSIS — I890 Lymphedema, not elsewhere classified: Secondary | ICD-10-CM | POA: Diagnosis not present

## 2017-05-10 NOTE — Progress Notes (Addendum)
Subjective:   [name] [name] is a 72 y.o. female who presents for Medicare Annual (Subsequent) preventive examination.  Review of Systems:  No ROS.  Medicare Wellness Visit. Additional risk factors are reflected in the social history.    Sleep patterns: gets up 2 times nightly to void and sleeps 4-5  hours nightly.  Patient reports insomnia issues, discussed recommended sleep tips and stress reduction tips, education was attached to patient's AVS.  Home Safety/Smoke Alarms: Feels safe in home. Smoke alarms in place.  Living environment; residence and Firearm Safety: apartment, no firearms. DME: rollator walker, tub bench, lives in senior apartment complex, has handy-cap apartment, Lives alone, no needs for DME, limited support system Seat Belt Safety/Bike Helmet: Wears seat belt.    Objective:     Vitals: There were no vitals taken for this visit.  There is no height or weight on file to calculate BMI.  Advanced Directives 07/20/2014 12/09/2013 11/29/2013 11/23/2013 11/17/2013  Does Patient Have a Medical Advance Directive? No No No No No  Would patient like information on creating a medical advance directive? - Yes - Educational materials given No - patient declined information Yes Higher education careers adviser given No - patient declined information    Tobacco Social History   Tobacco Use  Smoking Status Former Smoker  . Packs/day: 2.00  . Years: 15.00  . Pack years: 30.00  . Types: Cigarettes  . Last attempt to quit: 11/23/1985  . Years since quitting: 31.4  Smokeless Tobacco Never Used     Counseling given: Not Answered  Past Medical History:  Diagnosis Date  . Acute kidney injury ([place])   . Aortic stenosis, mild 11/17/2013  . Arthritis    "both knees, spine, back, fingers" (11/29/2013)  . CHF (congestive heart failure) ([place])   . Edema   . GERD (gastroesophageal reflux disease)   . Heart murmur   . History of diastolic dysfunction    Echo 06/9765 with diastolic dysfunction   . HTN (hypertension)   . Morbid obesity ([place])   . OA (osteoarthritis)   . PAC (premature atrial contraction)    per prior Holter  . Palpitations   . Pneumonia    "as a child"  . PVC's (premature ventricular contractions)    per prior Holter  . SVT (supraventricular tachycardia) ([place])   . Tachycardia    noted at 07/28/11 visit. Started on beta blocker. Possible atrial flutter vs long PR tachycardia/AVNRT  . Type II diabetes mellitus ([place])    Past Surgical History:  Procedure Laterality Date  . CESAREAN SECTION  1985  . CESAREAN SECTION  1985  . SUPRAVENTRICULAR TACHYCARDIA ABLATION  11/29/2013  . SUPRAVENTRICULAR TACHYCARDIA ABLATION N/A 11/29/2013   Procedure: SUPRAVENTRICULAR TACHYCARDIA ABLATION;  Surgeon: Evans Lance, MD;  [facility];  Service: Cardiovascular;  Laterality: N/A;  . US ECHOCARDIOGRAPHY  07/25/2008   EF 55-60%  . VAGINAL DELIVERY     Family History  Problem Relation Age of Onset  . Alzheimer's disease Mother   . Lung cancer Mother   . COPD Father   . Diabetes Maternal Uncle   . Stroke Neg Hx   . Colon cancer Neg Hx    Social History   Socioeconomic History  . Marital status: Single    Spouse name: Not on file  . Number of children: 2  . Years of education: Not on file  . Highest education level: Not on file  Social Needs  . Financial resource strain: Not on  file  . Food insecurity - worry: Not on file  . Food insecurity - inability: Not on file  . Transportation needs - medical: Not on file  . Transportation needs - non-medical: Not on file  Occupational History  . 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 Smoking status: Former Smoker    Packs/day: 2.00    Years: 15.00    Pack years: 30.00    Types: Cigarettes    Last attempt to quit: 11/23/1985    Years since quitting: 31.4  . Smokeless tobacco: Never Used  Substance and Sexual Activity  . Alcohol use: No  . Drug use: No  .  Sexual activity: No  Other Topics Concern  . Not on file  Social History Narrative  . Not on file    Outpatient Encounter Medications as of 05/11/2017  Medication Sig  . acetaminophen (TYLENOL) 325 MG tablet Take 2 tablets (650 mg total) by mouth every 6 (six) hours as needed for mild pain (or Fever >/= 101).  Marland Kitchen amLODipine (NORVASC) 10 MG tablet Take 1 tablet (10 mg total) by mouth at bedtime.  Marland Kitchen aspirin EC 81 MG tablet Take 81 mg by mouth daily with breakfast. Takes once or twice weekly  . betamethasone valerate ointment (VALISONE) 0.1 % Apply 1 application topically 2 (two) times daily.  . hydrOXYzine (ATARAX/VISTARIL) 25 MG tablet Take 1 tablet (25 mg total) by mouth at bedtime as needed for itching.  Marland Kitchen ibuprofen (ADVIL,MOTRIN) 600 MG tablet Take 1 tablet (600 mg total) by mouth every 8 (eight) hours as needed.  Marland Kitchen losartan (COZAAR) 100 MG tablet Take 1 tablet (100 mg total) by mouth daily.  . metFORMIN (GLUCOPHAGE) 500 MG tablet Take 1 tablet (500 mg total) by mouth daily with breakfast.  . methocarbamol (ROBAXIN) 500 MG tablet Take 1 tablet (500 mg total) by mouth every 6 (six) hours as needed for muscle spasms.  . metoprolol tartrate (LOPRESSOR) 50 MG tablet Take 1 tablet (50 mg total) by mouth 2 (two) times daily.  . montelukast (SINGULAIR) 10 MG tablet Take 1 tablet (10 mg total) by mouth at bedtime.  Marland Kitchen Neomycin-Bacitracin-Polymyxin ([facility] ANTIBIOTIC) [phone] OINT APPLY AFTER CLEANING BEFORE WRAPPING LGS  . omeprazole (PRILOSEC) 20 MG capsule TAKE 1 CAPSULE BY MOUTH 2 TIMES DAILY BEFORE A MEAL.   No facility-administered encounter medications on file as of 05/11/2017.     Activities of Daily Living No flowsheet data found.  Patient Care Team: Hoyt Koch, MD as PCP - General (Internal Medicine)    Assessment:   This is a routine wellness examination for [name] [name]. Physical assessment deferred to PCP.   Exercise Activities and Dietary recommendations   Diet (meal  preparation, eat out, water intake, caffeinated beverages, dairy products, fruits and vegetables): in general, a "healthy" diet  , well balanced   Reviewed heart healthy and diabetic diet, encouraged patient to increase daily water intake. Diet education was provided via handout.  Goals    None      Fall Risk Fall Risk  04/12/2017 03/05/2015 11/17/2013 10/19/2012  Falls in the past year? No No Yes No  Number falls in past yr: - - 2 or more -  Risk Factor Category  - - (No Data) -  Comment - - tripped twice, last time 1 wk ago getting into a truck with higher step -    Depression Screen [facility] 2/9 Scores 04/12/2017 03/05/2015 11/17/2013 10/19/2012  PHQ - 2 Score 0 0 1 0     Cognitive Function        Immunization History  Administered Date(s) Administered  . Influenza, High Dose Seasonal PF 01/23/2013, 03/03/2016  . Influenza,inj,Quad PF,6+ Mos 01/05/2014, 03/05/2015  . Influenza-Unspecified 01/18/2017  . Pneumococcal Conjugate-13 07/24/2014  . Pneumococcal Polysaccharide-23 10/19/2012  . Tdap 10/19/2012   Screening Tests Health Maintenance  Topic Date Due  . Hepatitis C Screening  [birth year]  . DEXA SCAN  10/13/2011  . MAMMOGRAM  05/28/2016  . OPHTHALMOLOGY EXAM  02/17/2017  . HEMOGLOBIN A1C  10/10/2017  . FOOT EXAM  04/12/2018  . TETANUS/TDAP  10/20/2022  . COLONOSCOPY  07/30/2024  . INFLUENZA VACCINE  Completed  . PNA vac Low Risk Adult  Completed      Plan:   [facility] referral placed to assist patient with social resources and education regarding diabetes   Continue doing brain stimulating activities (puzzles, reading, adult coloring books, staying active) to keep memory sharp.   Continue to eat heart healthy diet (full of fruits, vegetables, whole grains, lean protein, water--limit salt, fat, and sugar intake) and increase physical activity as tolerated.   I have personally reviewed and noted the following in the patient's chart:   . Medical and social history . Use  of alcohol, tobacco or illicit drugs  . Current medications and supplements . Functional ability and status . Nutritional status . Physical activity . Advanced directives . List of other physicians . Vitals . Screenings to include cognitive, depression, and falls . Referrals and appointments  In addition, I have reviewed and discussed with patient certain preventive protocols, quality metrics, and best practice recommendations. A written personalized care plan for preventive services as well as general preventive health recommendations were provided to patient.     Michiel Cowboy, RN  05/10/2017  Medical screening examination/treatment/procedure(s) were performed by non-physician practitioner and as supervising physician I was immediately available for consultation/collaboration. I agree with above. Michiel Cowboy, RN  Medical screening examination/treatment/procedure(s) were performed by non-physician practitioner and as supervising physician I was immediately available for consultation/collaboration. I agree with above. Cathlean Cower, MD

## 2017-05-11 ENCOUNTER — Ambulatory Visit (INDEPENDENT_AMBULATORY_CARE_PROVIDER_SITE_OTHER): Payer: PPO | Admitting: *Deleted

## 2017-05-11 VITALS — BP 146/84 | HR 53 | Resp 18 | Ht 71.0 in | Wt 311.0 lb

## 2017-05-11 DIAGNOSIS — Z0000 Encounter for general adult medical examination without abnormal findings: Secondary | ICD-10-CM | POA: Diagnosis not present

## 2017-05-11 DIAGNOSIS — E119 Type 2 diabetes mellitus without complications: Secondary | ICD-10-CM | POA: Diagnosis not present

## 2017-05-11 DIAGNOSIS — E2839 Other primary ovarian failure: Secondary | ICD-10-CM | POA: Diagnosis not present

## 2017-05-11 DIAGNOSIS — Z1231 Encounter for screening mammogram for malignant neoplasm of breast: Secondary | ICD-10-CM | POA: Diagnosis not present

## 2017-05-11 NOTE — Patient Instructions (Addendum)
Continue doing brain stimulating activities (puzzles, reading, adult coloring books, staying active) to keep memory sharp.   Continue to eat heart healthy diet (full of fruits, vegetables, whole grains, lean protein, water--limit salt, fat, and sugar intake) and increase physical activity as tolerated.   [name] , Thank you for taking time to come for your Medicare Wellness Visit. I appreciate your ongoing commitment to your health goals. Please review the following plan we discussed and let me know if I can assist you in the future.   These are the goals we discussed: Goals    . Patient Stated     Monitor diet for sugar, carbohydrates, and salt. Continue to be as active as possible socially and physically by getting out daily.        This is a list of the screening recommended for you and due dates:  Health Maintenance  Topic Date Due  .  Hepatitis C: One time screening is recommended by Center for Disease Control  (CDC) for  adults born from [birth year] through 1964.   1945-10-06  . DEXA scan (bone density measurement)  10/13/2011  . Mammogram  05/28/2016  . Hemoglobin A1C  10/10/2017  . Eye exam for diabetics  02/21/2018  . Complete foot exam   04/12/2018  . Tetanus Vaccine  10/20/2022  . Colon Cancer Screening  07/30/2024  . Flu Shot  Completed  . Pneumonia vaccines  Completed     Insomnia Insomnia is a sleep disorder that makes it difficult to fall asleep or to stay asleep. Insomnia can cause tiredness (fatigue), low energy, difficulty concentrating, mood swings, and poor performance at work or school. There are three different ways to classify insomnia:  Difficulty falling asleep.  Difficulty staying asleep.  Waking up too early in the morning.  Any type of insomnia can be long-term (chronic) or short-term (acute). Both are common. Short-term insomnia usually lasts for three months or less. Chronic insomnia occurs at least three times a week for longer than three  months. What are the causes? Insomnia may be caused by another condition, situation, or substance, such as:  Anxiety.  Certain medicines.  Gastroesophageal reflux disease (GERD) or other gastrointestinal conditions.  Asthma or other breathing conditions.  Restless legs syndrome, sleep apnea, or other sleep disorders.  Chronic pain.  Menopause. This may include hot flashes.  Stroke.  Abuse of alcohol, tobacco, or illegal drugs.  Depression.  Caffeine.  Neurological disorders, such as Alzheimer disease.  An overactive thyroid (hyperthyroidism).  The cause of insomnia may not be known. What increases the risk? Risk factors for insomnia include:  Gender. Women are more commonly affected than men.  Age. Insomnia is more common as you get older.  Stress. This may involve your professional or personal life.  Income. Insomnia is more common in people with lower income.  Lack of exercise.  Irregular work schedule or night shifts.  Traveling between different time zones.  What are the signs or symptoms? If you have insomnia, trouble falling asleep or trouble staying asleep is the main symptom. This may lead to other symptoms, such as:  Feeling fatigued.  Feeling nervous about going to sleep.  Not feeling rested in the morning.  Having trouble concentrating.  Feeling irritable, anxious, or depressed.  How is this treated? Treatment for insomnia depends on the cause. If your insomnia is caused by an underlying condition, treatment will focus on addressing the condition. Treatment may also include:  Medicines to help you sleep.  Counseling or therapy.  Lifestyle adjustments.  Follow these instructions at home:  Take medicines only as directed by your health care provider.  Keep regular sleeping and waking hours. Avoid naps.  Keep a sleep diary to help you and your health care provider figure out what could be causing your insomnia. Include: ? When you  sleep. ? When you wake up during the night. ? How well you sleep. ? How rested you feel the next day. ? Any side effects of medicines you are taking. ? What you eat and drink.  Make your bedroom a comfortable place where it is easy to fall asleep: ? Put up shades or special blackout curtains to block light from outside. ? Use a white noise machine to block noise. ? Keep the temperature cool.  Exercise regularly as directed by your health care provider. Avoid exercising right before bedtime.  Use relaxation techniques to manage stress. Ask your health care provider to suggest some techniques that may work well for you. These may include: ? Breathing exercises. ? Routines to release muscle tension. ? Visualizing peaceful scenes.  Cut back on alcohol, caffeinated beverages, and cigarettes, especially close to bedtime. These can disrupt your sleep.  Do not overeat or eat spicy foods right before bedtime. This can lead to digestive discomfort that can make it hard for you to sleep.  Limit screen use before bedtime. This includes: ? Watching TV. ? Using your smartphone, tablet, and computer.  Stick to a routine. This can help you fall asleep faster. Try to do a quiet activity, brush your teeth, and go to bed at the same time each night.  Get out of bed if you are still awake after 15 minutes of trying to sleep. Keep the lights down, but try reading or doing a quiet activity. When you feel sleepy, go back to bed.  Make sure that you drive carefully. Avoid driving if you feel very sleepy.  Keep all follow-up appointments as directed by your health care provider. This is important. Contact a health care provider if:  You are tired throughout the day or have trouble in your daily routine due to sleepiness.  You continue to have sleep problems or your sleep problems get worse. Get help right away if:  You have serious thoughts about hurting yourself or someone else. This information is  not intended to replace advice given to you by your health care provider. Make sure you discuss any questions you have with your health care provider. Document Released: 03/20/2000 Document Revised: 08/23/2015 Document Reviewed: 12/22/2013 Elsevier Interactive Patient Education  2018 [place] Eating Plan DASH stands for "Dietary Approaches to Stop Hypertension." The DASH eating plan is a healthy eating plan that has been shown to reduce high blood pressure (hypertension). It may also reduce your risk for type 2 diabetes, heart disease, and stroke. The DASH eating plan may also help with weight loss. What are tips for following this plan? General guidelines  Avoid eating more than 2,300 mg (milligrams) of salt (sodium) a day. If you have hypertension, you may need to reduce your sodium intake to 1,500 mg a day.  Limit alcohol intake to no more than 1 drink a day for nonpregnant women and 2 drinks a day for men. One drink equals 12 oz of beer, 5 oz of wine, or 1 oz of hard liquor.  Work with your health care provider to maintain a healthy body weight or to lose weight. Ask what  an ideal weight is for you.  Get at least 30 minutes of exercise that causes your heart to beat faster (aerobic exercise) most days of the week. Activities may include walking, swimming, or biking.  Work with your health care provider or diet and nutrition specialist (dietitian) to adjust your eating plan to your individual calorie needs. Reading food labels  Check food labels for the amount of sodium per serving. Choose foods with less than 5 percent of the Daily Value of sodium. Generally, foods with less than 300 mg of sodium per serving fit into this eating plan.  To find whole grains, look for the word "whole" as the first word in the ingredient list. Shopping  Buy products labeled as "low-sodium" or "no salt added."  Buy fresh foods. Avoid canned foods and premade or frozen  meals. Cooking  Avoid adding salt when cooking. Use salt-free seasonings or herbs instead of table salt or sea salt. Check with your health care provider or pharmacist before using salt substitutes.  Do not fry foods. Cook foods using healthy methods such as baking, boiling, grilling, and broiling instead.  Cook with heart-healthy oils, such as olive, canola, soybean, or sunflower oil. Meal planning   Eat a balanced diet that includes: ? 5 or more servings of fruits and vegetables each day. At each meal, try to fill half of your plate with fruits and vegetables. ? Up to 6-8 servings of whole grains each day. ? Less than 6 oz of lean meat, poultry, or fish each day. A 3-oz serving of meat is about the same size as a deck of cards. One egg equals 1 oz. ? 2 servings of low-fat dairy each day. ? A serving of nuts, seeds, or beans 5 times each week. ? Heart-healthy fats. Healthy fats called Omega-3 fatty acids are found in foods such as flaxseeds and coldwater fish, like sardines, salmon, and mackerel.  Limit how much you eat of the following: ? Canned or prepackaged foods. ? Food that is high in trans fat, such as fried foods. ? Food that is high in saturated fat, such as fatty meat. ? Sweets, desserts, sugary drinks, and other foods with added sugar. ? Full-fat dairy products.  Do not salt foods before eating.  Try to eat at least 2 vegetarian meals each week.  Eat more home-cooked food and less restaurant, buffet, and fast food.  When eating at a restaurant, ask that your food be prepared with less salt or no salt, if possible. What foods are recommended? The items listed may not be a complete list. Talk with your dietitian about what dietary choices are best for you. Grains Whole-grain or whole-wheat bread. Whole-grain or whole-wheat pasta. Brown rice. [place]. Bulgur. Whole-grain and low-sodium cereals. Pita bread. Low-fat, low-sodium crackers. Whole-wheat flour  tortillas. Vegetables Fresh or frozen vegetables (raw, steamed, roasted, or grilled). Low-sodium or reduced-sodium tomato and vegetable juice. Low-sodium or reduced-sodium tomato sauce and tomato paste. Low-sodium or reduced-sodium canned vegetables. Fruits All fresh, dried, or frozen fruit. Canned fruit in natural juice (without added sugar). Meat and other protein foods Skinless chicken or [place]. Ground chicken or [place]. Pork with fat trimmed off. Fish and seafood. Egg whites. Dried beans, peas, or lentils. Unsalted nuts, nut butters, and seeds. Unsalted canned beans. Lean cuts of beef with fat trimmed off. Low-sodium, lean deli meat. Dairy Low-fat (1%) or fat-free (skim) milk. Fat-free, low-fat, or reduced-fat cheeses. Nonfat, low-sodium ricotta or cottage cheese. Low-fat or nonfat yogurt. Low-fat, low-sodium  cheese. Fats and oils Soft margarine without trans fats. Vegetable oil. Low-fat, reduced-fat, or light mayonnaise and salad dressings (reduced-sodium). Canola, safflower, olive, soybean, and sunflower oils. Avocado. Seasoning and other foods Herbs. Spices. Seasoning mixes without salt. Unsalted popcorn and pretzels. Fat-free sweets. What foods are not recommended? The items listed may not be a complete list. Talk with your dietitian about what dietary choices are best for you. Grains Baked goods made with fat, such as croissants, muffins, or some breads. Dry pasta or rice meal packs. Vegetables Creamed or fried vegetables. Vegetables in a cheese sauce. Regular canned vegetables (not low-sodium or reduced-sodium). Regular canned tomato sauce and paste (not low-sodium or reduced-sodium). Regular tomato and vegetable juice (not low-sodium or reduced-sodium). Angie Fava. Olives. Fruits Canned fruit in a light or heavy syrup. Fried fruit. Fruit in cream or butter sauce. Meat and other protein foods Fatty cuts of meat. Ribs. Fried meat. Berniece Salines. Sausage. Bologna and other processed lunch meats.  Salami. Fatback. Hotdogs. Bratwurst. Salted nuts and seeds. Canned beans with added salt. Canned or smoked fish. Whole eggs or egg yolks. Chicken or [place] with skin. Dairy Whole or 2% milk, cream, and half-and-half. Whole or full-fat cream cheese. Whole-fat or sweetened yogurt. Full-fat cheese. Nondairy creamers. Whipped toppings. Processed cheese and cheese spreads. Fats and oils Butter. Stick margarine. Lard. Shortening. Ghee. Bacon fat. Tropical oils, such as coconut, palm kernel, or palm oil. Seasoning and other foods Salted popcorn and pretzels. Onion salt, garlic salt, seasoned salt, table salt, and sea salt. Worcestershire sauce. Tartar sauce. Barbecue sauce. Teriyaki sauce. Soy sauce, including reduced-sodium. Steak sauce. Canned and packaged gravies. Fish sauce. Oyster sauce. Cocktail sauce. Horseradish that you find on the shelf. Ketchup. Mustard. Meat flavorings and tenderizers. Bouillon cubes. Hot sauce and Tabasco sauce. Premade or packaged marinades. Premade or packaged taco seasonings. Relishes. Regular salad dressings. Where to find more information:  National Heart, Lung, and [place]: https://wilson-eaton.com/  American Heart Association: www.heart.org Summary  The DASH eating plan is a healthy eating plan that has been shown to reduce high blood pressure (hypertension). It may also reduce your risk for type 2 diabetes, heart disease, and stroke.  With the DASH eating plan, you should limit salt (sodium) intake to 2,300 mg a day. If you have hypertension, you may need to reduce your sodium intake to 1,500 mg a day.  When on the DASH eating plan, aim to eat more fresh fruits and vegetables, whole grains, lean proteins, low-fat dairy, and heart-healthy fats.  Work with your health care provider or diet and nutrition specialist (dietitian) to adjust your eating plan to your individual calorie needs. This information is not intended to replace advice given to you by your health  care provider. Make sure you discuss any questions you have with your health care provider. Document Released: 03/12/2011 Document Revised: 03/16/2016 Document Reviewed: 03/16/2016 Elsevier Interactive Patient Education  2018 [place].  Cervical Strain and Sprain Rehab Ask your health care provider which exercises are safe for you. Do exercises exactly as told by your health care provider and adjust them as directed. It is normal to feel mild stretching, pulling, tightness, or discomfort as you do these exercises, but you should stop right away if you feel sudden pain or your pain gets worse.Do not begin these exercises until told by your health care provider. Stretching and range of motion exercises These exercises warm up your muscles and joints and improve the movement and flexibility of your neck. These exercises also help  to relieve pain, numbness, and tingling. Exercise A: Cervical side bend  1. Using good posture, sit on a stable chair or stand up. 2. Without moving your shoulders, slowly tilt your left / right ear to your shoulder until you feel a stretch in your neck muscles. You should be looking straight ahead. 3. Hold for __________ seconds. 4. Repeat with the other side of your neck. Repeat __________ times. Complete this exercise __________ times a day. Exercise B: Cervical rotation  1. Using good posture, sit on a stable chair or stand up. 2. Slowly turn your head to the side as if you are looking over your left / right shoulder. ? Keep your eyes level with the ground. ? Stop when you feel a stretch along the side and the back of your neck. 3. Hold for __________ seconds. 4. Repeat this by turning to your other side. Repeat __________ times. Complete this exercise __________ times a day. Exercise C: Thoracic extension and pectoral stretch 1. Roll a towel or a small blanket so it is about 4 inches (10 cm) in diameter. 2. Lie down on your back on a firm surface. 3. Put  the towel lengthwise, under your spine in the middle of your back. It should not be not under your shoulder blades. The towel should line up with your spine from your middle back to your lower back. 4. Put your hands behind your head and let your elbows fall out to your sides. 5. Hold for __________ seconds. Repeat __________ times. Complete this exercise __________ times a day. Strengthening exercises These exercises build strength and endurance in your neck. Endurance is the ability to use your muscles for a long time, even after your muscles get tired. Exercise D: Upper cervical flexion, isometric 1. Lie on your back with a thin pillow behind your head and a small rolled-up towel under your neck. 2. Gently tuck your chin toward your chest and nod your head down to look toward your feet. Do not lift your head off the pillow. 3. Hold for __________ seconds. 4. Release the tension slowly. Relax your neck muscles completely before you repeat this exercise. Repeat __________ times. Complete this exercise __________ times a day. Exercise E: Cervical extension, isometric  1. Stand about 6 inches (15 cm) away from a wall, with your back facing the wall. 2. Place a soft object, about 6-8 inches (15-20 cm) in diameter, between the back of your head and the wall. A soft object could be a small pillow, a ball, or a folded towel. 3. Gently tilt your head back and press into the soft object. Keep your jaw and forehead relaxed. 4. Hold for __________ seconds. 5. Release the tension slowly. Relax your neck muscles completely before you repeat this exercise. Repeat __________ times. Complete this exercise __________ times a day. Posture and body mechanics  Body mechanics refers to the movements and positions of your body while you do your daily activities. Posture is part of body mechanics. Good posture and healthy body mechanics can help to relieve stress in your body's tissues and joints. Good posture  means that your spine is in its natural S-curve position (your spine is neutral), your shoulders are pulled back slightly, and your head is not tipped forward. The following are general guidelines for applying improved posture and body mechanics to your everyday activities. Standing  When standing, keep your spine neutral and keep your feet about hip-width apart. Keep a slight bend in your knees. Your ears,  shoulders, and hips should line up.  When you do a task in which you stand in one place for a long time, place one foot up on a stable object that is 2-4 inches (5-10 cm) high, such as a footstool. This helps keep your spine neutral. Sitting   When sitting, keep your spine neutral and your keep feet flat on the floor. Use a footrest, if necessary, and keep your thighs parallel to the floor. Avoid rounding your shoulders, and avoid tilting your head forward.  When working at a desk or a computer, keep your desk at a height where your hands are slightly lower than your elbows. Slide your chair under your desk so you are close enough to maintain good posture.  When working at a computer, place your monitor at a height where you are looking straight ahead and you do not have to tilt your head forward or downward to look at the screen. Resting When lying down and resting, avoid positions that are most painful for you. Try to support your neck in a neutral position. You can use a contour pillow or a small rolled-up towel. Your pillow should support your neck but not push on it. This information is not intended to replace advice given to you by your health care provider. Make sure you discuss any questions you have with your health care provider. Document Released: 03/23/2005 Document Revised: 11/28/2015 Document Reviewed: 02/27/2015 Elsevier Interactive Patient Education  2018 [employer].  Back Exercises If you have pain in your back, do these exercises 2-3 times each day or as told by your  doctor. When the pain goes away, do the exercises once each day, but repeat the steps more times for each exercise (do more repetitions). If you do not have pain in your back, do these exercises once each day or as told by your doctor. Exercises Single Knee to Chest  Do these steps 3-5 times in a row for each leg: 1. Lie on your back on a firm bed or the floor with your legs stretched out. 2. Bring one knee to your chest. 3. Hold your knee to your chest by grabbing your knee or thigh. 4. Pull on your knee until you feel a gentle stretch in your lower back. 5. Keep doing the stretch for 10-30 seconds. 6. Slowly let go of your leg and straighten it.  Pelvic Tilt  Do these steps 5-10 times in a row: 1. Lie on your back on a firm bed or the floor with your legs stretched out. 2. Bend your knees so they point up to the ceiling. Your feet should be flat on the floor. 3. Tighten your lower belly (abdomen) muscles to press your lower back against the floor. This will make your tailbone point up to the ceiling instead of pointing down to your feet or the floor. 4. Stay in this position for 5-10 seconds while you gently tighten your muscles and breathe evenly.  Cat-Cow  Do these steps until your lower back bends more easily: 1. Get on your hands and knees on a firm surface. Keep your hands under your shoulders, and keep your knees under your hips. You may put padding under your knees. 2. Let your head hang down, and make your tailbone point down to the floor so your lower back is round like the back of a cat. 3. Stay in this position for 5 seconds. 4. Slowly lift your head and make your tailbone point up to the  ceiling so your back hangs low (sags) like the back of a cow. 5. Stay in this position for 5 seconds.  Press-Ups  Do these steps 5-10 times in a row: 1. Lie on your belly (face-down) on the floor. 2. Place your hands near your head, about shoulder-width apart. 3. While you keep your  back relaxed and keep your hips on the floor, slowly straighten your arms to raise the top half of your body and lift your shoulders. Do not use your back muscles. To make yourself more comfortable, you may change where you place your hands. 4. Stay in this position for 5 seconds. 5. Slowly return to lying flat on the floor.  Bridges  Do these steps 10 times in a row: 1. Lie on your back on a firm surface. 2. Bend your knees so they point up to the ceiling. Your feet should be flat on the floor. 3. Tighten your butt muscles and lift your butt off of the floor until your waist is almost as high as your knees. If you do not feel the muscles working in your butt and the back of your thighs, slide your feet 1-2 inches farther away from your butt. 4. Stay in this position for 3-5 seconds. 5. Slowly lower your butt to the floor, and let your butt muscles relax.  If this exercise is too easy, try doing it with your arms crossed over your chest. Belly Crunches  Do these steps 5-10 times in a row: 1. Lie on your back on a firm bed or the floor with your legs stretched out. 2. Bend your knees so they point up to the ceiling. Your feet should be flat on the floor. 3. Cross your arms over your chest. 4. Tip your chin a little bit toward your chest but do not bend your neck. 5. Tighten your belly muscles and slowly raise your chest just enough to lift your shoulder blades a tiny bit off of the floor. 6. Slowly lower your chest and your head to the floor.  Back Lifts Do these steps 5-10 times in a row: 1. Lie on your belly (face-down) with your arms at your sides, and rest your forehead on the floor. 2. Tighten the muscles in your legs and your butt. 3. Slowly lift your chest off of the floor while you keep your hips on the floor. Keep the back of your head in line with the curve in your back. Look at the floor while you do this. 4. Stay in this position for 3-5 seconds. 5. Slowly lower your chest  and your face to the floor.  Contact a doctor if:  Your back pain gets a lot worse when you do an exercise.  Your back pain does not lessen 2 hours after you exercise. If you have any of these problems, stop doing the exercises. Do not do them again unless your doctor says it is okay. Get help right away if:  You have sudden, very bad back pain. If this happens, stop doing the exercises. Do not do them again unless your doctor says it is okay. This information is not intended to replace advice given to you by your health care provider. Make sure you discuss any questions you have with your health care provider. Document Released: 04/25/2010 Document Revised: 08/29/2015 Document Reviewed: 05/17/2014 Elsevier Interactive Patient Education  2018 [employer].  Knee Exercises Ask your health care provider which exercises are safe for you. Do exercises exactly as told  by your health care provider and adjust them as directed. It is normal to feel mild stretching, pulling, tightness, or discomfort as you do these exercises, but you should stop right away if you feel sudden pain or your pain gets worse.Do not begin these exercises until told by your health care provider. STRETCHING AND RANGE OF MOTION EXERCISES These exercises warm up your muscles and joints and improve the movement and flexibility of your knee. These exercises also help to relieve pain, numbness, and tingling. Exercise A: Knee Extension, Prone 1. Lie on your abdomen on a bed. 2. Place your left / right knee just beyond the edge of the surface so your knee is not on the bed. You can put a towel under your left / right thigh just above your knee for comfort. 3. Relax your leg muscles and allow gravity to straighten your knee. You should feel a stretch behind your left / right knee. 4. Hold this position for __________ seconds. 5. Scoot up so your knee is supported between repetitions. Repeat __________ times. Complete this stretch  __________ times a day. Exercise B: Knee Flexion, Active  1. Lie on your back with both knees straight. If this causes back discomfort, bend your left / right knee so your foot is flat on the floor. 2. Slowly slide your left / right heel back toward your buttocks until you feel a gentle stretch in the front of your knee or thigh. 3. Hold this position for __________ seconds. 4. Slowly slide your left / right heel back to the starting position. Repeat __________ times. Complete this exercise __________ times a day. Exercise C: Quadriceps, Prone  1. Lie on your abdomen on a firm surface, such as a bed or padded floor. 2. Bend your left / right knee and hold your ankle. If you cannot reach your ankle or pant leg, loop a belt around your foot and grab the belt instead. 3. Gently pull your heel toward your buttocks. Your knee should not slide out to the side. You should feel a stretch in the front of your thigh and knee. 4. Hold this position for __________ seconds. Repeat __________ times. Complete this stretch __________ times a day. Exercise D: Hamstring, Supine 1. Lie on your back. 2. Loop a belt or towel over the ball of your left / right foot. The ball of your foot is on the walking surface, right under your toes. 3. Straighten your left / right knee and slowly pull on the belt to raise your leg until you feel a gentle stretch behind your knee. ? Do not let your left / right knee bend while you do this. ? Keep your other leg flat on the floor. 4. Hold this position for __________ seconds. Repeat __________ times. Complete this stretch __________ times a day. STRENGTHENING EXERCISES These exercises build strength and endurance in your knee. Endurance is the ability to use your muscles for a long time, even after they get tired. Exercise E: Quadriceps, Isometric  1. Lie on your back with your left / right leg extended and your other knee bent. Put a rolled towel or small pillow under your  knee if told by your health care provider. 2. Slowly tense the muscles in the front of your left / right thigh. You should see your kneecap slide up toward your hip or see increased dimpling just above the knee. This motion will push the back of the knee toward the floor. 3. For __________ seconds, keep the  muscle as tight as you can without increasing your pain. 4. Relax the muscles slowly and completely. Repeat __________ times. Complete this exercise __________ times a day. Exercise F: Straight Leg Raises - Quadriceps 1. Lie on your back with your left / right leg extended and your other knee bent. 2. Tense the muscles in the front of your left / right thigh. You should see your kneecap slide up or see increased dimpling just above the knee. Your thigh may even shake a bit. 3. Keep these muscles tight as you raise your leg 4-6 inches (10-15 cm) off the floor. Do not let your knee bend. 4. Hold this position for __________ seconds. 5. Keep these muscles tense as you lower your leg. 6. Relax your muscles slowly and completely after each repetition. Repeat __________ times. Complete this exercise __________ times a day. Exercise G: Hamstring, Isometric 1. Lie on your back on a firm surface. 2. Bend your left / right knee approximately __________ degrees. 3. Dig your left / right heel into the surface as if you are trying to pull it toward your buttocks. Tighten the muscles in the back of your thighs to dig as hard as you can without increasing any pain. 4. Hold this position for __________ seconds. 5. Release the tension gradually and allow your muscles to relax completely for __________ seconds after each repetition. Repeat __________ times. Complete this exercise __________ times a day. Exercise H: Hamstring Curls  If told by your health care provider, do this exercise while wearing ankle weights. Begin with __________ weights. Then increase the weight by 1 lb (0.5 kg) increments. Do not wear  ankle weights that are more than __________. 1. Lie on your abdomen with your legs straight. 2. Bend your left / right knee as far as you can without feeling pain. Keep your hips flat against the floor. 3. Hold this position for __________ seconds. 4. Slowly lower your leg to the starting position.  Repeat __________ times. Complete this exercise __________ times a day. Exercise I: Squats (Quadriceps) 1. Stand in front of a table, with your feet and knees pointing straight ahead. You may rest your hands on the table for balance but not for support. 2. Slowly bend your knees and lower your hips like you are going to sit in a chair. ? Keep your weight over your heels, not over your toes. ? Keep your lower legs upright so they are parallel with the table legs. ? Do not let your hips go lower than your knees. ? Do not bend lower than told by your health care provider. ? If your knee pain increases, do not bend as low. 3. Hold the squat position for __________ seconds. 4. Slowly push with your legs to return to standing. Do not use your hands to pull yourself to standing. Repeat __________ times. Complete this exercise __________ times a day. Exercise J: Wall Slides (Quadriceps)  1. Lean your back against a smooth wall or door while you walk your feet out 18-24 inches (46-61 cm) from it. 2. Place your feet hip-width apart. 3. Slowly slide down the wall or door until your knees bend __________ degrees. Keep your knees over your heels, not over your toes. Keep your knees in line with your hips. 4. Hold for __________ seconds. Repeat __________ times. Complete this exercise __________ times a day. Exercise K: Straight Leg Raises - Hip Abductors 1. Lie on your side with your left / right leg in the top position. Shanda Howells  so your head, shoulder, knee, and hip line up. You may bend your bottom knee to help you keep your balance. 2. Roll your hips slightly forward so your hips are stacked directly over  each other and your left / right knee is facing forward. 3. Leading with your heel, lift your top leg 4-6 inches (10-15 cm). You should feel the muscles in your outer hip lifting. ? Do not let your foot drift forward. ? Do not let your knee roll toward the ceiling. 4. Hold this position for __________ seconds. 5. Slowly return your leg to the starting position. 6. Let your muscles relax completely after each repetition. Repeat __________ times. Complete this exercise __________ times a day. Exercise L: Straight Leg Raises - Hip Extensors 1. Lie on your abdomen on a firm surface. You can put a pillow under your hips if that is more comfortable. 2. Tense the muscles in your buttocks and lift your left / right leg about 4-6 inches (10-15 cm). Keep your knee straight as you lift your leg. 3. Hold this position for __________ seconds. 4. Slowly lower your leg to the starting position. 5. Let your leg relax completely after each repetition. Repeat __________ times. Complete this exercise __________ times a day. This information is not intended to replace advice given to you by your health care provider. Make sure you discuss any questions you have with your health care provider. Document Released: 02/04/2005 Document Revised: 12/16/2015 Document Reviewed: 01/27/2015 Elsevier Interactive Patient Education  2018 [employer].

## 2017-05-12 ENCOUNTER — Other Ambulatory Visit: Payer: Self-pay

## 2017-05-12 ENCOUNTER — Encounter (HOSPITAL_BASED_OUTPATIENT_CLINIC_OR_DEPARTMENT_OTHER): Payer: PPO | Attending: Physician Assistant

## 2017-05-12 DIAGNOSIS — L97812 Non-pressure chronic ulcer of other part of right lower leg with fat layer exposed: Secondary | ICD-10-CM | POA: Diagnosis not present

## 2017-05-12 DIAGNOSIS — E11622 Type 2 diabetes mellitus with other skin ulcer: Secondary | ICD-10-CM | POA: Diagnosis not present

## 2017-05-12 DIAGNOSIS — I890 Lymphedema, not elsewhere classified: Secondary | ICD-10-CM | POA: Insufficient documentation

## 2017-05-12 DIAGNOSIS — Z6839 Body mass index (BMI) 39.0-39.9, adult: Secondary | ICD-10-CM | POA: Diagnosis not present

## 2017-05-12 DIAGNOSIS — L97822 Non-pressure chronic ulcer of other part of left lower leg with fat layer exposed: Secondary | ICD-10-CM | POA: Insufficient documentation

## 2017-05-12 DIAGNOSIS — Z87891 Personal history of nicotine dependence: Secondary | ICD-10-CM | POA: Insufficient documentation

## 2017-05-12 DIAGNOSIS — I87333 Chronic venous hypertension (idiopathic) with ulcer and inflammation of bilateral lower extremity: Secondary | ICD-10-CM | POA: Diagnosis not present

## 2017-05-12 DIAGNOSIS — I10 Essential (primary) hypertension: Secondary | ICD-10-CM | POA: Insufficient documentation

## 2017-05-12 DIAGNOSIS — L97212 Non-pressure chronic ulcer of right calf with fat layer exposed: Secondary | ICD-10-CM | POA: Diagnosis not present

## 2017-05-12 NOTE — Patient Outreach (Signed)
[facility] [facility]) Care Management  05/12/2017  [name] [name] Feb 20, 1946 [id number]   Telephone call to patient for MD Referral.  No answer.  HIPAA compliant voice message left.  Plan: RN CM will contact patient again within 24 hours.  Jone Baseman, RN, MSN [facility] Care Management Care Management Coordinator Direct Line [phone] Toll Free: [phone]  Fax: [phone]

## 2017-05-13 ENCOUNTER — Other Ambulatory Visit: Payer: Self-pay

## 2017-05-13 NOTE — Patient Outreach (Signed)
[place] [facility]) Care Management  05/13/2017  Lourie Stille 1946-03-28 [id number]   2nd telephone call to patient for MD referral screening. No answer.  HIPAA compliant voice message left.  Plan: RN CM will send letter and outreach again in 10 business days.    Jone Baseman, RN, MSN [facility] Care Management Care Management Coordinator Direct Line [phone] Toll Free: [phone]  Fax: [phone]

## 2017-05-14 ENCOUNTER — Other Ambulatory Visit: Payer: Self-pay

## 2017-05-14 NOTE — Patient Outreach (Signed)
[place] [facility]) Care Management  05/14/2017  Chistina Mcnease 02-Jan-1946 [id number]   Return phone call to patient for screening.  No answer.  HIPAA compliant voice message left.  RN CM will wait patient return call if not will attempt again on 05-26-17.  Jone Baseman, RN, MSN [facility] Care Management Care Management Coordinator Direct Line [phone] Toll Free: [phone]  Fax: [phone]

## 2017-05-18 ENCOUNTER — Other Ambulatory Visit: Payer: Self-pay | Admitting: *Deleted

## 2017-05-18 NOTE — Patient Outreach (Signed)
[facility] [facility]) Care Management  05/18/2017  [name] [name] July 09, 1945 [id number]  Referral and updated information received from [name] [name], Octa at [facility] regarding [name] [name] care management needs. [name] [name] indicates that [name] [name] would benefit especially from social work intervention to address lack of support/living situation in addition to HTN management.   Plan: I have reached out to the telephonic case manager who has made attempts to contact [name] [name] to provide updated information. In addition, I will reach out to [name] [name] to let her know that our telephonic case manager has unsuccessfully attempted contact with [name] [name] since she was in the office on 05/11/17 and request that she consider another telephone outreach to [name] [name] to remind her of the referral and future incoming call.    [facility]  [phone]

## 2017-05-19 ENCOUNTER — Other Ambulatory Visit: Payer: Self-pay

## 2017-05-19 DIAGNOSIS — L97212 Non-pressure chronic ulcer of right calf with fat layer exposed: Secondary | ICD-10-CM | POA: Diagnosis not present

## 2017-05-19 DIAGNOSIS — L97829 Non-pressure chronic ulcer of other part of left lower leg with unspecified severity: Secondary | ICD-10-CM | POA: Diagnosis not present

## 2017-05-19 DIAGNOSIS — I872 Venous insufficiency (chronic) (peripheral): Secondary | ICD-10-CM | POA: Diagnosis not present

## 2017-05-19 DIAGNOSIS — I890 Lymphedema, not elsewhere classified: Secondary | ICD-10-CM | POA: Diagnosis not present

## 2017-05-19 DIAGNOSIS — I87333 Chronic venous hypertension (idiopathic) with ulcer and inflammation of bilateral lower extremity: Secondary | ICD-10-CM | POA: Diagnosis not present

## 2017-05-19 DIAGNOSIS — L97222 Non-pressure chronic ulcer of left calf with fat layer exposed: Secondary | ICD-10-CM | POA: Diagnosis not present

## 2017-05-19 NOTE — Patient Outreach (Addendum)
[facility]) Care Management  05/19/2017  Takeysha Califf 05-Apr-1946 [id number]   In basket message from Janalyn Shy regarding patient outreach and concerns of health management and possible social isolation from practice health coach.  Patient and CM playing phone tag.  Return call to patient. No answer.  HIPAA compliant voice message left.  Plan: RN CM will outreach patient again on 05-26-17.  Jone Baseman, RN, MSN [facility] Care Management Care Management Coordinator Direct Line [phone] Toll Free: [phone]  Fax: [phone]

## 2017-05-20 ENCOUNTER — Other Ambulatory Visit: Payer: Self-pay | Admitting: *Deleted

## 2017-05-20 ENCOUNTER — Other Ambulatory Visit: Payer: Self-pay

## 2017-05-20 MED ORDER — OMEPRAZOLE 20 MG PO CPDR: 20.0000 mg | DELAYED_RELEASE_CAPSULE | Freq: Every day | ORAL | 0 refills | Status: DC

## 2017-05-20 NOTE — Patient Outreach (Signed)
[facility]) Care Management  05/20/2017  Shauntelle Helm [birth year] [id number]   Referral Date: 05/11/17 Referral Source: MD Referral Referral Reason: Reason for consult SW referral for financial resource assistance regarding food and paying for essential bills. Needs diabetic education, currently has open wounds BLE and is being treated at the wound center.      Outreach Attempt:  Patient returned call right away after CM called. Patient able to verify HIPAA. Patient reports that she is doing ok.  Patient reports she tries to remain active by participating in activities.  Patient denies any need for food resources as she has access to a food truck that comes to her church.    Patient reports that she has some bad teeth and in need of some tooth extractions and would like resources for that and any other resources for dental care.    Social:  Patient lives alone in a senior citizens community but states she stays active with [employer] 3 times a week and has friends.  Patient goes to church regularly and attends a book club as well.  Patient has children locally but states they are busy working most of the time.    Conditions: Patient admits to osteoarthritis, diabetes, lymphedema, HTN, and venous stasis of lower legs.  Patient A1c increased since last reading but her doctor has not had her to check her sugars at this time. Patient reports she tries to watch what she eat.  Patient agreeable to education regarding her diabetes and possible weight loss.    She reports problems with osteoarthritis and uses a walker for ambulation.  Patient reports she has lymphedema with leg wounds and goes to the wound center weekly for leg wrapping.  Patient also reports that she has some type of sequential compression pumps for her legs to help with lymphedema but patient admits to not using them as it hurts legs and she is barely able to walk afterward.  However, patient has some new  ones that she will be trying out.      Medications: Patient takes her medication regularly and able to afford medication.    Appointments:  Patient saw physician last month and went for wellness check with office nurse on 05-11-17 and [facility] referral was made.  Patient uses handicap transportation but reports that she needs to be picked up on the van but they have changed to small cars that will not accommodate her and her walker.  She states she has  Gotten the appropriate paperwork to her doctor to be completed for necessity of Lucianne Lei transportation.     Advanced Directives: Patient does not have an advanced directive at this time and declined wanting work on it presently.   Consent:Discussed [facility] with patient.  She is agreeable to health coach and social for assistance.   Plan:RN CM will refer patient to health coach for education and support for disease management of her diabetes.   RN CM will refer to social work for any resources for tooth extraction and dental care.   RN CM will sign off at this time.     Jone Baseman, RN, MSN [facility] Care Management Care Management Coordinator Direct Line [phone] Toll Free: [phone]  Fax: [phone]

## 2017-05-20 NOTE — Patient Outreach (Signed)
[place] [facility]) Care Management  05/20/2017  [name] [name] 04-08-1945 [id number]   Return call to patient for referral screening.  No answer.  HIPAA compliant voice message left.  Plan: RN CM will attempt patient again on 05-26-17.    Jone Baseman, RN, MSN [facility] Care Management Care Management Coordinator Direct Line [phone] Toll Free: [phone]  Fax: [phone]

## 2017-05-21 ENCOUNTER — Other Ambulatory Visit: Payer: Self-pay | Admitting: Licensed Clinical Social Worker

## 2017-05-21 ENCOUNTER — Other Ambulatory Visit: Payer: Self-pay | Admitting: *Deleted

## 2017-05-21 ENCOUNTER — Encounter: Payer: Self-pay | Admitting: *Deleted

## 2017-05-21 NOTE — Patient Outreach (Signed)
[facility]) Care Management  05/21/2017  Benny Kiesel 1946-02-22 [id number]  Assessment-CSW completed initial outreach attempt today after receiving new referral for dental assistance resources. CSW unable to reach patient successfully. CSW left a HIPPA compliant voice message encouraging patient to return call once available.  Plan-CSW will await return call or complete an additional outreach next week.   Eula Fried, BSW, MSW, [email or web address] Phone: [phone] Fax: [phone]

## 2017-05-24 ENCOUNTER — Other Ambulatory Visit: Payer: Self-pay | Admitting: Licensed Clinical Social Worker

## 2017-05-24 NOTE — Patient Outreach (Signed)
  [name] [name]) Care Management  05/24/2017  [name] [name] 04/05/1946 [id number]  Assessment-CSW completed second outreach attempt today. CSW unable to reach patient successfully. CSW left a HIPPA compliant voice message encouraging patient to return call once available. CSW will send outreach barrier letter out to patient at this time and will wait 10 business days to hear back from patient before closing case.   Plan-CSW will send outreach barrier letter out at this time.   [name] [name], BSW, MSW, [name] Phone: [phone] Fax: [phone]

## 2017-05-24 NOTE — Patient Outreach (Signed)
[place] [facility]) Care Management  05/24/2017  Jnaya Bracewell [birth year] [id number]  Assessment-CSW received incoming return call from patient who is in need of dental resources. Patient provided HIPPA verifications successfully. CSW introduced self, reason for call and of [facility] social work services. Patient reports that she is in need of some social work assistance as she needs teeth extractions and basic dental care but has no Designer, fashion/clothing and is unable to pay. CSW provided education on how there are limited dental resources in [facility] but that CSW has a few resources that may be beneficial to patient which include:   [facility] [phone] [email or web address] Provides [facility] and related tooth extraction services. Located in [place] and opened in 1978. Treats patients who travel from [place], [place] and many other communities in the surrounding areas.  [facility] [phone] Toll Free [phone] [employer] Provides donated dental care to those who are disabled, medically compromised, or elderly (65+) and who have no financial resources with which to pay for their extensive dental care needs. No cost to approved applicants. People in a position to pay for part of their care may be encouraged to contribute, especially when lab work is involved. Call for application. Complete application form, sign, and mail to DDS. Once application is received, it is processed and the applicant is placed on a waiting list. When a dentist is in applicant's area is available, applicant will be contacted directly to complete intake interview and confirm eligibility. Must be permanently disabled, critically ill, or elderly (65+) and have no financial resources to pay. DDS does NOT cover routine or emergency care.  [phone] Information and Appointments ([facility] in [place] on the [place] that is open to the public. Students in the [employer] and Dental Assisting programs are trained in various procedures by working with actual patients. Clinic is open during spring and fall semesters, not during the summers. February - June ONLY: clinic provides additional services, including complete exams, fillings, extractions, root canals, crowns, and temporary partial fillings. Insurance NOT accepted. Basic Services: $5 - $40. Surgical extractions, crowns, root canals range: $60 - $450. Cash or Checks ONLY; no credit cards. Payment required on day of service; no payment plans available. Appointment REQUIRED. Initial appointment is for screening medical and dental history. Second appointment (on a different day) will be made for treatment. [email or web address]   [place] [id number] ext.[place] and [place], [place] [address] Services offered include Oral prophylaxis (teeth cleaning), Pit and fissure sealants, Dental x-rays, Fluoride treatments, Plaque control instruction Appointment times vary semester-to-semester and generally last 3 hours. Both clinics are open to [employer] students, faculty/staff and the general public. Rates for services offered by the clinics are [facility]) [phone] Operates portable free dental clinics through [place] as an Research officer, political party of the [employer]. Provides free dental care to as many underserved persons within [place] as possible. To see upcoming clinic dates, visit [email or web address]  [facility] ([place] [phone] Patient Admissions Dental care  and treatment provided by students enrolled in the Doctorate in Dental Surgery program (D.D.S.) and dental hygiene and dental assisting students. Services provided in all area of dental care including: fillings, crowns and bridges, complete and partial dentures, implants, gum treatments, root canals, extractions, as well as preventive care. Fees vary  depending on treatment - usually 1/3 to 2/3 the cost treatment dentist offices. Dentures usually less than 1/3 the normal cost. A monthly drawing is held at the beginning of each month. If your application is selected, it will be reviewed and placed on a waiting list for a screening appointment. Call and have application mailed to you  [facility] Department-(336) [facility]- [phone].   Patient was appreciative of information provided and feels comfortable with information being mailed to her for her review. Patient is interested in getting tooth extractions but is unsure how many she will need which means x-rays will need to be completed which will cost more. Patient was informed that [facility] in [place], [place] charges $90 PER TOOTH extraction and x-rays will cost her $95.00. CSW was unable to find some coupons on their website and will mail these coupons to patient's residence. Patient has [employer] as her main source of stable transportation and will need to ask a loved one to provide stable transportation for her to get to [facility] in [place], [place]. Patient agreeable to outreach call next week to ensure that she was received dental resources in the mail.  Plan-CSW will send complete dental resource list to patient's residence, as well as [facility]'s complete pricing list and [facility]'s coupons. CSW will follow up in one week.   Eula Fried, BSW, MSW, [email or web address] Phone: [phone] Fax: [phone]

## 2017-05-25 NOTE — Telephone Encounter (Signed)
This encounter was created in error - please disregard.

## 2017-05-25 NOTE — Patient Outreach (Signed)
Request received from Brooke Joyce, LCSW to mail patient personal care resources.  Information mailed today. 

## 2017-05-26 ENCOUNTER — Ambulatory Visit: Payer: Self-pay

## 2017-05-26 DIAGNOSIS — L97222 Non-pressure chronic ulcer of left calf with fat layer exposed: Secondary | ICD-10-CM | POA: Diagnosis not present

## 2017-05-26 DIAGNOSIS — I872 Venous insufficiency (chronic) (peripheral): Secondary | ICD-10-CM | POA: Diagnosis not present

## 2017-05-26 DIAGNOSIS — I890 Lymphedema, not elsewhere classified: Secondary | ICD-10-CM | POA: Diagnosis not present

## 2017-05-26 DIAGNOSIS — I87333 Chronic venous hypertension (idiopathic) with ulcer and inflammation of bilateral lower extremity: Secondary | ICD-10-CM | POA: Diagnosis not present

## 2017-05-26 DIAGNOSIS — L97219 Non-pressure chronic ulcer of right calf with unspecified severity: Secondary | ICD-10-CM | POA: Diagnosis not present

## 2017-05-31 ENCOUNTER — Ambulatory Visit (INDEPENDENT_AMBULATORY_CARE_PROVIDER_SITE_OTHER): Payer: PPO | Admitting: Orthotics

## 2017-05-31 DIAGNOSIS — M79676 Pain in unspecified toe(s): Secondary | ICD-10-CM

## 2017-05-31 DIAGNOSIS — E0842 Diabetes mellitus due to underlying condition with diabetic polyneuropathy: Secondary | ICD-10-CM | POA: Diagnosis not present

## 2017-05-31 DIAGNOSIS — L84 Corns and callosities: Secondary | ICD-10-CM

## 2017-05-31 DIAGNOSIS — E1151 Type 2 diabetes mellitus with diabetic peripheral angiopathy without gangrene: Secondary | ICD-10-CM

## 2017-05-31 DIAGNOSIS — E1142 Type 2 diabetes mellitus with diabetic polyneuropathy: Secondary | ICD-10-CM

## 2017-05-31 NOTE — Progress Notes (Signed)

## 2017-06-02 ENCOUNTER — Other Ambulatory Visit: Payer: Self-pay | Admitting: *Deleted

## 2017-06-02 ENCOUNTER — Encounter: Payer: Self-pay | Admitting: *Deleted

## 2017-06-02 DIAGNOSIS — I872 Venous insufficiency (chronic) (peripheral): Secondary | ICD-10-CM | POA: Diagnosis not present

## 2017-06-02 DIAGNOSIS — L97222 Non-pressure chronic ulcer of left calf with fat layer exposed: Secondary | ICD-10-CM | POA: Diagnosis not present

## 2017-06-02 DIAGNOSIS — I890 Lymphedema, not elsewhere classified: Secondary | ICD-10-CM | POA: Diagnosis not present

## 2017-06-02 DIAGNOSIS — I87333 Chronic venous hypertension (idiopathic) with ulcer and inflammation of bilateral lower extremity: Secondary | ICD-10-CM | POA: Diagnosis not present

## 2017-06-02 NOTE — Patient Outreach (Signed)
[facility]) Care Management  06/02/2017  Tanishia Gordin 1945-10-29 [id number]   RN Health Coach attempted #1 follow up outreach call to patient.  Patient was unavailable. HIPPA compliance voicemail message left with return callback number.  Plan: RN will call patient again within 10 business days.  [place] Care Management [phone]

## 2017-06-02 NOTE — Patient Outreach (Signed)
[place] [facility]) Care Management  06/02/2017   [name] [name] 02/10/1946 [id number]  RN Health Coach received return telephone call from patient.  Hipaa compliance verified.Per patent she does not check her blood sugar. Patient does not have a meter. Per patient at this time the physician does not want to order one. Patient asking how do they know I am a diabetic or borderline. RN explained what the A1C means. Patient does not know signs and symptoms of hypo and hyperglycemia. Patient has agreed to further outreach calls.  Current Medications:  Current Outpatient Medications  Medication Sig Dispense Refill  . acetaminophen (TYLENOL) 325 MG tablet Take 2 tablets (650 mg total) by mouth every 6 (six) hours as needed for mild pain (or Fever >/= 101).    Marland Kitchen amLODipine (NORVASC) 10 MG tablet Take 1 tablet (10 mg total) by mouth at bedtime. 90 tablet 1  . aspirin EC 81 MG tablet Take 81 mg by mouth daily with breakfast. Takes once or twice weekly    . betamethasone valerate ointment (VALISONE) 0.1 % Apply 1 application topically 2 (two) times daily. 45 g 0  . hydrOXYzine (ATARAX/VISTARIL) 25 MG tablet Take 1 tablet (25 mg total) by mouth at bedtime as needed for itching. 14 tablet 0  . ibuprofen (ADVIL,MOTRIN) 600 MG tablet Take 1 tablet (600 mg total) by mouth every 8 (eight) hours as needed. 15 tablet 0  . losartan (COZAAR) 100 MG tablet Take 1 tablet (100 mg total) by mouth daily. 90 tablet 1  . metFORMIN (GLUCOPHAGE) 500 MG tablet Take 1 tablet (500 mg total) by mouth daily with breakfast. 90 tablet 3  . methocarbamol (ROBAXIN) 500 MG tablet Take 1 tablet (500 mg total) by mouth every 6 (six) hours as needed for muscle spasms. 30 tablet 0  . metoprolol tartrate (LOPRESSOR) 50 MG tablet Take 1 tablet (50 mg total) by mouth 2 (two) times daily. 180 tablet 1  . montelukast (SINGULAIR) 10 MG tablet Take 1 tablet (10 mg total) by mouth at bedtime. 90 tablet 3  .  Neomycin-Bacitracin-Polymyxin ([employer] ANTIBIOTIC) [phone] OINT APPLY AFTER CLEANING BEFORE WRAPPING LGS 453.9 g 0  . omeprazole (PRILOSEC) 20 MG capsule Take 1 capsule (20 mg total) by mouth daily. 90 capsule 0   No current facility-administered medications for this visit.     Functional Status:  In your present state of health, do you have any difficulty performing the following activities: 06/02/2017 05/11/2017  Hearing? N N  Vision? N N  Difficulty concentrating or making decisions? N N  Walking or climbing stairs? Y Y  Dressing or bathing? N N  Doing errands, shopping? N N  Preparing Food and eating ? N N  Using the Toilet? N N  In the past six months, have you accidently leaked urine? N Y  Do you have problems with loss of bowel control? N N  Managing your Medications? N N  Managing your Finances? N N  Housekeeping or managing your Housekeeping? N N  Some recent data might be hidden    Fall/Depression Screening: Fall Risk  06/02/2017 05/11/2017 04/12/2017  Falls in the past year? No No No  Number falls in past yr: - - -  Risk Factor Category  - - -  Comment - - -  Risk for fall due to : Impaired balance/gait;Impaired mobility Impaired mobility;Impaired balance/gait -  Risk for fall due to: Comment - patient in handy capped apartment, fall risk discussed -   PHQ 2/9 Scores  06/02/2017 05/20/2017 05/11/2017 04/12/2017 03/05/2015 11/17/2013 10/19/2012  PHQ - 2 Score 0 0 0 0 0 1 0  PHQ- 9 Score - - 3 - - - -   THN CM Care Plan Problem One     Most Recent Value  Care Plan Problem One  Knowledge deficit in self management of Diabetes  Role Documenting the Problem One  [place] for Problem One  Active  THN Long Term Goal   Patient will have a decrease in A1C  from 6.6 within the next 90 days  THN Long Term Goal Start Date  06/02/17  Interventions for Problem One Long Term Goal  RN discussed what the A1C is. RN sent EMMI educational material on What have the A1C checked. RN  will follow up with further discussion and outreach  [facility] CM Short Term Goal #1   Patient will be able to verbalize  signs and symptoms of hypo and hyperglycemia within the next 30 days  THN CM Short Term Goal #1 Start Date  06/02/17  Interventions for Short Term Goal #1  RN discussed signs and symptoms of hyper and hypoglycemia. RN sent patient a facial chart on signs and symptoms of hypo and hyperglycemia. RN  sent EMMI educational material on hypo and hyperglycemia. RN will follow up with further discussion and teachback  THN CM Short Term Goal #2   Patient will document her symptom of hyper and hypoglycemia within the next 30 days  THN CM Short Term Goal #2 Start Date  06/02/17  Interventions for Short Term Goal #2  RN discussed with  patient about hypo and hyperglycemia. RN sent  patient a 2019 calendar book  to write down anytime she is having any symptoms of hypo and hyperglycemia. RN will follow up with discussion and teachback      Assessment:  Patient knows A1C is 6.6 Patient does not know what A1C means Patient is not aware of signs and symptoms of hypo and hyperglycemia Patient does not check blood sugars Patient does not have a meter Patient will benefit from [place] telephonic outreach for education and support for diabetes self management.  Plan: Discussed what A1C is  RN sent patient EMMI educational material on A1C RN discussed hypo and hyperglycemic reactions.  RN sent facial pictures of hypo and hyperglycemia RN sent EMMI educational on hyper and hypoglycemia RN sent patient a 2019 Calendar book Patient will document if she has any symptoms of hypo or hyperglycemia reaction in calendar book  RN sent Living well with diabetes booklet RN will follow up outreach within the month of March RN sent barriers and assessment letter  to [place] Management [phone]

## 2017-06-04 ENCOUNTER — Other Ambulatory Visit: Payer: Self-pay | Admitting: Licensed Clinical Social Worker

## 2017-06-04 NOTE — Patient Outreach (Signed)
[place]) Care Management  06/04/2017  [name] [name] March 31, 1946 [id number]  Assessment-CSW completed outreach today to see if patient received dental resources in the mail yet. CSW unable to reach patient successfully. CSW left a HIPPA compliant voice message encouraging patient to return call once available.   Plan-CSW will await return call or complete an additional outreach if needed.  Eula Fried, BSW, MSW, [email or web address] Phone: [phone] Fax: [phone]

## 2017-06-04 NOTE — Patient Outreach (Signed)
[place] [facility]) Care Management  06/04/2017  [name] [name] 06-Apr-1946 [id number]  Assessment- CSW received incoming return call from patient. Patient reports that she did receive the complete list of dental resources in the mail that CSW sent. CSW completed review of these again with patient. Patient reports narrowing down her choice to either [facility] or [facility]. Patient denies any further social work needs at this time and is agreeable to social work case closure. CSW will close case at this time.  Plan-CSW will notify [facility] RNCM.  Eula Fried, BSW, MSW, [email or web address] Phone: [phone] Fax: [phone]

## 2017-06-07 ENCOUNTER — Ambulatory Visit: Payer: PPO | Admitting: Licensed Clinical Social Worker

## 2017-06-14 DIAGNOSIS — H353131 Nonexudative age-related macular degeneration, bilateral, early dry stage: Secondary | ICD-10-CM | POA: Diagnosis not present

## 2017-06-14 DIAGNOSIS — H2513 Age-related nuclear cataract, bilateral: Secondary | ICD-10-CM | POA: Diagnosis not present

## 2017-06-14 DIAGNOSIS — E119 Type 2 diabetes mellitus without complications: Secondary | ICD-10-CM | POA: Diagnosis not present

## 2017-06-16 ENCOUNTER — Other Ambulatory Visit: Payer: Self-pay | Admitting: Internal Medicine

## 2017-06-17 ENCOUNTER — Telehealth: Payer: Self-pay | Admitting: Internal Medicine

## 2017-06-17 DIAGNOSIS — H9193 Unspecified hearing loss, bilateral: Secondary | ICD-10-CM

## 2017-06-17 NOTE — Addendum Note (Signed)
Addended by: [name] A on: 06/17/2017 03:48 PM   Modules accepted: Orders

## 2017-06-17 NOTE — Telephone Encounter (Signed)
Referral placed.

## 2017-06-17 NOTE — Telephone Encounter (Signed)
Patient informed. 

## 2017-06-17 NOTE — Telephone Encounter (Signed)
Copied from [place]. Topic: Referral - Request >> Jun 17, 2017 10:26 AM Neva Seat wrote: Pt requesting a referral at:  [facility] [place], [place] [phone] >> Jun 17, 2017 11:20 AM Para Skeans A wrote: Called patient to see why she is needing this. If she has called that office yet to see if she even needs a referral. Any other information that could be useful. THANK YOU.  >> Jun 17, 2017  1:52 PM Synthia Innocent wrote: Patient calling back states she is not hearing well, patient has contacted their office and does need a referral

## 2017-06-25 ENCOUNTER — Ambulatory Visit: Payer: PPO

## 2017-06-25 ENCOUNTER — Other Ambulatory Visit: Payer: PPO

## 2017-06-29 ENCOUNTER — Other Ambulatory Visit: Payer: Self-pay | Admitting: *Deleted

## 2017-06-29 NOTE — Patient Outreach (Signed)
[place] [facility]) Care Management  06/29/2017  Jabree Ramo 09/25/1945 [id number]   RN Health Coach attempted follow up outreach call to patient.  Patient was unavailable. HIPPA compliance voicemail message left with return callback number.  Plan: RN will call patient again within 10 business days  [place] Management [phone]

## 2017-06-30 ENCOUNTER — Encounter: Payer: Self-pay | Admitting: Internal Medicine

## 2017-06-30 ENCOUNTER — Ambulatory Visit
Admission: RE | Admit: 2017-06-30 | Discharge: 2017-06-30 | Disposition: A | Discharge status: Home / Self Care | Payer: PPO | Source: Ambulatory Visit | Attending: Internal Medicine | Admitting: Internal Medicine

## 2017-06-30 DIAGNOSIS — Z780 Asymptomatic menopausal state: Secondary | ICD-10-CM | POA: Diagnosis not present

## 2017-06-30 DIAGNOSIS — Z1231 Encounter for screening mammogram for malignant neoplasm of breast: Secondary | ICD-10-CM

## 2017-06-30 DIAGNOSIS — E2839 Other primary ovarian failure: Secondary | ICD-10-CM

## 2017-07-01 ENCOUNTER — Ambulatory Visit: Payer: Self-pay | Admitting: *Deleted

## 2017-07-27 ENCOUNTER — Ambulatory Visit: Payer: PPO | Admitting: Sports Medicine

## 2017-08-02 ENCOUNTER — Other Ambulatory Visit: Payer: Self-pay | Admitting: *Deleted

## 2017-08-02 NOTE — Patient Outreach (Signed)
[place] [facility]) Care Management  08/02/2017   [name] [name] November 27, 1945 [id number]  RN Health Coach telephone call to patient.  Hipaa compliance verified. Per patient she is doing pretty good. Patient does not need to check blood sugars per physician . Patient had an incident of a fall but no injuries. Per patient she just feels stiff.  Patient does have a walker. Per patient she is feeling stiff. Patient does have a silver Paediatric nurse. Patient is trying to eat healthy. RN discussed foods high and low in sodium. Patient has agreed to follow up outreach calls.  Current Medications:  Current Outpatient Medications  Medication Sig Dispense Refill  . acetaminophen (TYLENOL) 325 MG tablet Take 2 tablets (650 mg total) by mouth every 6 (six) hours as needed for mild pain (or Fever >/= 101).    Marland Kitchen amLODipine (NORVASC) 10 MG tablet Take 1 tablet (10 mg total) by mouth at bedtime. 90 tablet 1  . aspirin EC 81 MG tablet Take 81 mg by mouth daily with breakfast. Takes once or twice weekly    . betamethasone valerate ointment (VALISONE) 0.1 % Apply 1 application topically 2 (two) times daily. 45 g 0  . hydrOXYzine (ATARAX/VISTARIL) 25 MG tablet Take 1 tablet (25 mg total) by mouth at bedtime as needed for itching. 14 tablet 0  . ibuprofen (ADVIL,MOTRIN) 600 MG tablet Take 1 tablet (600 mg total) by mouth every 8 (eight) hours as needed. 15 tablet 0  . losartan (COZAAR) 100 MG tablet Take 1 tablet (100 mg total) by mouth daily. 90 tablet 1  . metFORMIN (GLUCOPHAGE) 500 MG tablet TAKE 1 TABLET BY MOUTH DAILY WITH BREAKFAST. 90 tablet 3  . methocarbamol (ROBAXIN) 500 MG tablet Take 1 tablet (500 mg total) by mouth every 6 (six) hours as needed for muscle spasms. 30 tablet 0  . metoprolol tartrate (LOPRESSOR) 50 MG tablet Take 1 tablet (50 mg total) by mouth 2 (two) times daily. 180 tablet 1  . montelukast (SINGULAIR) 10 MG tablet Take 1 tablet (10 mg total) by mouth at bedtime. 90 tablet  3  . Neomycin-Bacitracin-Polymyxin ([employer]S ANTIBIOTIC) [phone] OINT APPLY AFTER CLEANING BEFORE WRAPPING LGS 453.9 g 0  . omeprazole (PRILOSEC) 20 MG capsule Take 1 capsule (20 mg total) by mouth daily. 90 capsule 0   No current facility-administered medications for this visit.     Functional Status:  In your present state of health, do you have any difficulty performing the following activities: 08/02/2017 06/02/2017  Hearing? N N  Vision? N N  Difficulty concentrating or making decisions? N N  Walking or climbing stairs? Y Y  Dressing or bathing? N N  Doing errands, shopping? N N  Preparing Food and eating ? N N  Using the Toilet? N N  In the past six months, have you accidently leaked urine? N N  Do you have problems with loss of bowel control? N N  Managing your Medications? N N  Managing your Finances? N N  Housekeeping or managing your Housekeeping? N N  Some recent data might be hidden    Fall/Depression Screening: Fall Risk  08/02/2017 06/02/2017 05/11/2017  Falls in the past year? Yes No No  Number falls in past yr: 1 - -  Injury with Fall? No - -  Risk Factor Category  - - -  Comment - - -  Risk for fall due to : History of fall(s);Impaired balance/gait;Impaired mobility Impaired balance/gait;Impaired mobility Impaired mobility;Impaired balance/gait  Risk for fall  due to: Comment - - patient in handy capped apartment, fall risk discussed  Follow up Falls evaluation completed;Falls prevention discussed;Education provided - -   PHQ 2/9 Scores 08/02/2017 06/02/2017 05/20/2017 05/11/2017 04/12/2017 03/05/2015 11/17/2013  PHQ - 2 Score 0 0 0 0 0 0 1  PHQ- 9 Score - - - 3 - - -    Assessment:  Patient does not check blood sugars per physician request Patient had a fall Patient received educational material RN sent  Plan:  RN discussed fall prevention RN sent educational material on fall prevention RN discussed low sodium diet RN sent educational material on low sodium  diet RN sent facial sheet on foods high and low in sodium  [place] Management [phone]

## 2017-08-03 ENCOUNTER — Ambulatory Visit: Payer: PPO | Admitting: Sports Medicine

## 2017-08-13 ENCOUNTER — Other Ambulatory Visit: Payer: Self-pay | Admitting: Cardiology

## 2017-08-13 NOTE — Telephone Encounter (Signed)
REFILL 

## 2017-08-31 ENCOUNTER — Encounter: Payer: Self-pay | Admitting: Sports Medicine

## 2017-08-31 ENCOUNTER — Ambulatory Visit (INDEPENDENT_AMBULATORY_CARE_PROVIDER_SITE_OTHER): Payer: PPO | Admitting: Sports Medicine

## 2017-08-31 DIAGNOSIS — M79676 Pain in unspecified toe(s): Secondary | ICD-10-CM

## 2017-08-31 DIAGNOSIS — E1151 Type 2 diabetes mellitus with diabetic peripheral angiopathy without gangrene: Secondary | ICD-10-CM | POA: Diagnosis not present

## 2017-08-31 DIAGNOSIS — E1142 Type 2 diabetes mellitus with diabetic polyneuropathy: Secondary | ICD-10-CM

## 2017-08-31 DIAGNOSIS — B351 Tinea unguium: Secondary | ICD-10-CM

## 2017-08-31 NOTE — Progress Notes (Signed)
Subjective: [name] [name] is a 72 y.o. female patient with history of diabetes who presents to office today complaining of long, painful nails  while ambulating in shoes; unable to trim. Patient states that the glucose reading this morning was not recorded, last A1c 6.6. Patient denies any new changes in medication or new problems. Reports she is still going to wound care center for her leg wounds were healed but opened back up on left. Patient denies any other constitutitonal symptoms.    Patient Active Problem List   Diagnosis Date Noted  . Routine general medical examination at a health care facility 05/10/2014  . Paroxysmal SVT (supraventricular tachycardia) ([place]) 11/28/2013  . Physical deconditioning 11/23/2013  . Multilevel spine pain 11/17/2013  . Aortic stenosis, mild 11/17/2013  . DM type 2 (diabetes mellitus, type 2) ([place]) 02/02/2013  . Arthritis 10/19/2012  . Mixed incontinence 10/19/2012  . GERD (gastroesophageal reflux disease) 04/20/2011  . Venous stasis of lower extremity 01/20/2011  . HTN (hypertension) 01/20/2011  . Morbid obesity ([place])    Current Outpatient Medications on File Prior to Visit  Medication Sig Dispense Refill  . acetaminophen (TYLENOL) 325 MG tablet Take 2 tablets (650 mg total) by mouth every 6 (six) hours as needed for mild pain (or Fever >/= 101).    Marland Kitchen amLODipine (NORVASC) 10 MG tablet Take 1 tablet (10 mg total) by mouth at bedtime. 90 tablet 1  . aspirin EC 81 MG tablet Take 81 mg by mouth daily with breakfast. Takes once or twice weekly    . betamethasone valerate ointment (VALISONE) 0.1 % Apply 1 application topically 2 (two) times daily. 45 g 0  . hydrOXYzine (ATARAX/VISTARIL) 25 MG tablet Take 1 tablet (25 mg total) by mouth at bedtime as needed for itching. 14 tablet 0  . ibuprofen (ADVIL,MOTRIN) 600 MG tablet Take 1 tablet (600 mg total) by mouth every 8 (eight) hours as needed. 15 tablet 0  . losartan (COZAAR) 100 MG tablet Take 1 tablet (100  mg total) by mouth daily. 90 tablet 1  . metFORMIN (GLUCOPHAGE) 500 MG tablet TAKE 1 TABLET BY MOUTH DAILY WITH BREAKFAST. 90 tablet 3  . methocarbamol (ROBAXIN) 500 MG tablet Take 1 tablet (500 mg total) by mouth every 6 (six) hours as needed for muscle spasms. 30 tablet 0  . metoprolol tartrate (LOPRESSOR) 50 MG tablet Take 1 tablet (50 mg total) by mouth 2 (two) times daily. 180 tablet 1  . montelukast (SINGULAIR) 10 MG tablet Take 1 tablet (10 mg total) by mouth at bedtime. 90 tablet 3  . Neomycin-Bacitracin-Polymyxin ([employer]S ANTIBIOTIC) [phone] OINT APPLY AFTER CLEANING BEFORE WRAPPING LGS 453.9 g 0  . omeprazole (PRILOSEC) 20 MG capsule TAKE 1 CAPSULE BY MOUTH EVERY DAY 90 capsule 0   No current facility-administered medications on file prior to visit.    Allergies  Allergen Reactions  . Oxycodone Hcl Nausea And Vomiting  . Penicillins Itching    No results found for this or any previous visit (from the past 2160 hour(s)).  Objective: General: Patient is awake, alert, and oriented x 3 and in no acute distress.  Integument: Skin is warm, dry and supple bilateral. Nails are tender, long, thickened and  dystrophic with subungual debris, consistent with onychomycosis, 1-5 bilateral. No signs of infection. Unna boot on left. Remaining integument unremarkable.  Vasculature:  Dorsalis Pedis pulse 0/4 bilateral. Posterior Tibial pulse  0/4 bilateral.  Capillary fill time <5 sec 1-5 bilateral. Lymphedema bilateral.   Neurology: The patient has  absent sensation measured with a 5.07/10 g Semmes Weinstein Monofilament at all pedal sites bilateral . Vibratory sensation absent bilateral with tuning fork. No Babinski sign present bilateral.   Musculoskeletal:Asymptomatic bunion and pes planus pedal deformities noted bilateral. Muscular strength 5/5 in all lower extremity muscular groups bilateral without pain on range of motion . No tenderness with calf compression bilateral.  Assessment and  Plan: Problem List Items Addressed This Visit    None    Visit Diagnoses    Mycotic toenails    -  Primary   Pain of toe, unspecified laterality       Diabetic peripheral vascular disease ([place])       Diabetic peripheral neuropathy ([place])          -Examined patient. -Discussed and educated patient on diabetic foot care, especially with  regards to the vascular, neurological and musculoskeletal systems.  -Stressed the importance of good glycemic control and the detriment of not  controlling glucose levels in relation to the foot. -Mechanically debrided all nails 1-5 bilateral using sterile nail nipper and filed with dremel without incident  -Continue with diabetic shoes -Continue with follow up at wound care center for leg ulcer on left  -Patient to return  in 3 months for at risk foot care -Patient advised to call the office if any problems or questions arise in the meantime.  Landis Martins, DPM

## 2017-09-02 ENCOUNTER — Telehealth: Payer: Self-pay

## 2017-09-02 ENCOUNTER — Ambulatory Visit: Payer: Self-pay | Admitting: *Deleted

## 2017-09-02 DIAGNOSIS — M549 Dorsalgia, unspecified: Secondary | ICD-10-CM

## 2017-09-02 NOTE — Telephone Encounter (Signed)
A hard copy of Rx for the rolling walker is on my desk in yellow folder if patient needs it or needs to be faxed for some reason

## 2017-09-02 NOTE — Telephone Encounter (Signed)
Rx placed, let [facility] know.

## 2017-09-02 NOTE — Telephone Encounter (Signed)
Copied from [place] [id number]. Topic: General - Other >> Sep 02, 2017  1:44 PM Carolyn Stare wrote:  Pt call to say her walker broke and need a RX sent over to [facility] [address] phone number [phone]

## 2017-09-02 NOTE — Telephone Encounter (Signed)
What kind of walker?

## 2017-09-02 NOTE — Telephone Encounter (Signed)
Extra large seated walker called [facility]

## 2017-09-02 NOTE — Telephone Encounter (Signed)
[facility] informed

## 2017-09-07 NOTE — Telephone Encounter (Signed)
Pt. Called back with [facility] fax # [phone]

## 2017-09-07 NOTE — Telephone Encounter (Signed)
Pt. Called and said she spoke to [facility] and they told her they did not have Rx for walker.  She is needing this.  Please let patient and [facility] regarding this.

## 2017-09-07 NOTE — Telephone Encounter (Signed)
She is confined until she gets the walker.  Please asking if can have taken care of today

## 2017-09-07 NOTE — Telephone Encounter (Signed)
faxed

## 2017-09-08 ENCOUNTER — Other Ambulatory Visit: Payer: Self-pay | Admitting: *Deleted

## 2017-09-08 NOTE — Patient Outreach (Signed)
[place] [facility]) Care Management  09/08/2017  [name] [name] Aug 06, 1945 [id number]   RN Health Coach attempted follow up outreach call to patient.  Patient was unavailable. HIPPA compliance voicemail message left with return callback number.  Plan: RN will call patient again within 10 business days.  [place] Care Management [phone]

## 2017-09-10 ENCOUNTER — Other Ambulatory Visit: Payer: Self-pay | Admitting: *Deleted

## 2017-09-10 ENCOUNTER — Encounter: Payer: Self-pay | Admitting: *Deleted

## 2017-09-10 NOTE — Patient Outreach (Signed)
[place]) Care Management  09/10/2017   [name] [name] Oct 24, 1945 [id number]  RN Health Coach telephone call to patient.  Hipaa compliance verified. Per patient she is doing good. The patient rollator walker is broken. Patient  went to medical supply store to pick up a new walker and they were out of stock. The medical supply store had one delivered to her the next day. The patient does not check her blood sugar per physician order. Patient states she is feeling good. Patient is trying to eat healthier. Patient has concerns about her transportation to doctor offices. Per patient with her size she is having a lot of difficulty getting into the cars and needs to ride the bus  for transportation to be able to get herself and her walker in. Patient also discussed an interest in getting a lift chair. Patient has expressed some difficulty hearing with understanding some words when spoken to. Patient has scheduled for a hearing test. Patient would like to have more information on obtaining a lift chair. Patient has agreed to further outreach calls.   Current Medications:  Current Outpatient Medications  Medication Sig Dispense Refill  . acetaminophen (TYLENOL) 325 MG tablet Take 2 tablets (650 mg total) by mouth every 6 (six) hours as needed for mild pain (or Fever >/= 101).    Marland Kitchen amLODipine (NORVASC) 10 MG tablet Take 1 tablet (10 mg total) by mouth at bedtime. 90 tablet 1  . aspirin EC 81 MG tablet Take 81 mg by mouth daily with breakfast. Takes once or twice weekly    . betamethasone valerate ointment (VALISONE) 0.1 % Apply 1 application topically 2 (two) times daily. 45 g 0  . hydrOXYzine (ATARAX/VISTARIL) 25 MG tablet Take 1 tablet (25 mg total) by mouth at bedtime as needed for itching. 14 tablet 0  . ibuprofen (ADVIL,MOTRIN) 600 MG tablet Take 1 tablet (600 mg total) by mouth every 8 (eight) hours as needed. 15 tablet 0  . losartan (COZAAR) 100 MG tablet Take 1 tablet (100 mg  total) by mouth daily. 90 tablet 1  . metFORMIN (GLUCOPHAGE) 500 MG tablet TAKE 1 TABLET BY MOUTH DAILY WITH BREAKFAST. 90 tablet 3  . methocarbamol (ROBAXIN) 500 MG tablet Take 1 tablet (500 mg total) by mouth every 6 (six) hours as needed for muscle spasms. 30 tablet 0  . metoprolol tartrate (LOPRESSOR) 50 MG tablet Take 1 tablet (50 mg total) by mouth 2 (two) times daily. 180 tablet 1  . montelukast (SINGULAIR) 10 MG tablet Take 1 tablet (10 mg total) by mouth at bedtime. 90 tablet 3  . Neomycin-Bacitracin-Polymyxin ([employer] ANTIBIOTIC) [phone] OINT APPLY AFTER CLEANING BEFORE WRAPPING LGS 453.9 g 0  . omeprazole (PRILOSEC) 20 MG capsule TAKE 1 CAPSULE BY MOUTH EVERY DAY 90 capsule 0   No current facility-administered medications for this visit.     Functional Status:  In your present state of health, do you have any difficulty performing the following activities: 08/02/2017 06/02/2017  Hearing? N N  Vision? N N  Difficulty concentrating or making decisions? N N  Walking or climbing stairs? Y Y  Dressing or bathing? N N  Doing errands, shopping? N N  Preparing Food and eating ? N N  Using the Toilet? N N  In the past six months, have you accidently leaked urine? N N  Do you have problems with loss of bowel control? N N  Managing your Medications? N N  Managing your Finances? N N  Housekeeping or  managing your Housekeeping? N N  Some recent data might be hidden    Fall/Depression Screening: Fall Risk  09/10/2017 08/02/2017 06/02/2017  Falls in the past year? Yes Yes No  Number falls in past yr: 1 1 -  Injury with Fall? No No -  Risk Factor Category  - - -  Comment - - -  Risk for fall due to : History of fall(s);Impaired balance/gait;Impaired mobility History of fall(s);Impaired balance/gait;Impaired mobility Impaired balance/gait;Impaired mobility  Risk for fall due to: Comment - - -  Follow up Falls evaluation completed;Falls prevention discussed;Education provided Falls  evaluation completed;Falls prevention discussed;Education provided -   [facility] 2/9 Scores 09/10/2017 08/02/2017 06/02/2017 05/20/2017 05/11/2017 04/12/2017 03/05/2015  PHQ - 2 Score 0 0 0 0 0 0 0  PHQ- 9 Score - - - - 3 - -   [facility] CM Care Plan Problem One     Most Recent Value  Care Plan Problem One  Knowledge deficit in self management of Diabetes  Role Documenting the Problem One  [place] for Problem One  Active  [facility] Long Term Goal   Patient will have a decrease in A1C  from 6.6 within the next 90 days  Interventions for Problem One Long Term Goal  Patient understands what A1C is and is waiting for next blood draw to see where she stands. RN will follow up with next outreach  [facility] CM Short Term Goal #1   patient will have transportion issues resolved within the next 30 days  [facility] CM Short Term Goal #1 Start Date  09/10/17  Interventions for Short Term Goal #1  Patient discusscused issues with transportation car vs bus and her size and equipment. RN referred to Education officer, museum . RN will follow up for further outreach  [facility] CM Short Term Goal #3  Patient will not have any falls within the next 30 days  [facility] CM Short Term Goal #3 Met Date  09/10/17  [facility] CM Short Term Goal #4 Met Date  09/10/17  [facility] CM Short Term Goal #5   Patient will follow up with audio exam within the next 30 days  [facility] CM Short Term Goal #5 Start Date  09/10/17  Interventions for Short Term Goal #5  RN and patient discussed patient hearing deficits. Patient has scheduled for a hearing test. RN will follow up with further outreach      Assessment:  Patient has received her new walker Patient is having some difficulty hearing  Patient would like to get a lift chair  Plan:  Patient has scheduled a hearing test RN discussed health Maintenance  RN sent patient diabetes education on day to day and throughout the year Referred to social worker RN will follow up within the month of July  Maxine Fredman [facility] Management [phone]

## 2017-09-13 ENCOUNTER — Other Ambulatory Visit: Payer: Self-pay | Admitting: Licensed Clinical Social Worker

## 2017-09-13 NOTE — Patient Outreach (Signed)
[place] [facility]) Care Management  09/13/2017  Yasmina Timpone 10/06/1945 [id number]  [facility] CSW received incoming return call from patient. HIPPA verifications provided successfully. [facility] CSW introduced self and reason for call. Patient reports wanting a lift chair. [facility] CSW encouraged patient to discuss this DME request with her PCP at her next appointment. Patient aware that she would be responsible for the 20% amount left after Medicaid pays 80% coverage. Patient's next appointment with her PCP is next month and she will make a request for DME to be ordered if PCP finds equipment appropriate. Patient reports that [facility] has recently made changes to their program and are providing transportation with both cars and buses now. Patient reports that she is unable to get in and out of cars and therefore this has caused her to cancel medical appointments. Patient reports that she needs an additional source of stable transportation now to get her to both non medical and medical appointments if this continues. Patient reports that she never knows whether [facility] will provide a car or a bus for her until that day when they arrive. [facility] CSW questioned if her PCP had made a request to [employer] yet to see if patient can ONLY ride [facility] buses and she stated that she is unsure. [facility] CSW will transition referral to [place] as this is is solely a transportation need. Patient agreeable to [place] reaching out to her and assisting her gaining a new source of stable transportation if [employer] is unable to to provide rides by bus. [facility] CSW will sign off at this time.  Eula Fried, BSW, MSW, [email or web address] Phone: [phone] Fax: [phone]

## 2017-09-13 NOTE — Patient Outreach (Signed)
[place] [facility]) Care Management  09/13/2017  [name] [name] 11-08-1945 [id number]  Assessment-CSW completed outreach attempt today after receiving new referral on patient for transportation assistance. CSW unable to reach patient successfully. CSW left a HIPPA compliant voice message encouraging patient to return call once available.  Plan-CSW will await return call or complete an additional outreach if needed.  Eula Fried, BSW, MSW, [email or web address] Phone: [phone] Fax: [phone]

## 2017-09-16 ENCOUNTER — Other Ambulatory Visit: Payer: Self-pay

## 2017-09-16 NOTE — Patient Outreach (Addendum)
[place] [facility]) Care Management  09/16/2017  Anjelica Delauter 1945/07/21 [id number]  First outreach attempt in response to social work referral.  BSW left [employer]. BSW will make second attempt within four business days.  Unsuccessful outreach letter mailed.     Ronn Melena, BSW Social Worker [phone]

## 2017-09-17 ENCOUNTER — Other Ambulatory Visit: Payer: Self-pay

## 2017-09-17 NOTE — Patient Outreach (Signed)
[facility]) Care Management  09/17/2017  [name] [name] November 21, 1945 [id number]   BSW received return phone call from patient.  BSW explained that she was referred to social work for transportation assistance.  Patient reported that she currently utilizes [facility]. Per patient, she has informed them multiple times that she needs to be transported in a lift [place] versus a car due to difficulty with her knees.  She also reported that her MD provided a letter about her needing a lift van.   BSW contacted Courtney at [facility] regarding this concern.  Per Loma Sousa, the city just recently started using cars to transport patient's that are ambulatory.  She also reported that as of 09/15/17, a notation was added to patient's account that a lift Lucianne Lei should always be sent versus a car.  She encouraged patient to contact [facility] customer service  @ [phone] to file a complaint if a car is sent at any time going forward.  BSW called patient back to inform her of this and provided [employer] customer service number.   Patient declined applying for [employer]. BSW will close case at this time due to no other social work needs being identified, however patient was encouraged to call if other needs arise or she continues to have difficulty with transportation.  Ronn Melena, BSW Social Worker [phone]

## 2017-09-21 ENCOUNTER — Ambulatory Visit: Payer: PPO

## 2017-09-29 ENCOUNTER — Ambulatory Visit: Payer: PPO | Attending: Internal Medicine | Admitting: Audiology

## 2017-09-29 ENCOUNTER — Telehealth: Payer: Self-pay | Admitting: Internal Medicine

## 2017-09-29 DIAGNOSIS — H918X9 Other specified hearing loss, unspecified ear: Secondary | ICD-10-CM | POA: Diagnosis not present

## 2017-09-29 DIAGNOSIS — H93299 Other abnormal auditory perceptions, unspecified ear: Secondary | ICD-10-CM | POA: Diagnosis not present

## 2017-09-29 DIAGNOSIS — H93213 Auditory recruitment, bilateral: Secondary | ICD-10-CM | POA: Insufficient documentation

## 2017-09-29 DIAGNOSIS — H903 Sensorineural hearing loss, bilateral: Secondary | ICD-10-CM | POA: Diagnosis not present

## 2017-09-29 DIAGNOSIS — H9190 Unspecified hearing loss, unspecified ear: Secondary | ICD-10-CM

## 2017-09-29 NOTE — Telephone Encounter (Signed)
Received a call from [name] [name] regarding an Emmi call that she received. Returned call, (LVM), to inform her that she has already completed her AWV for 2019 and has already scheduled her wellness appt for 2020. SF

## 2017-09-29 NOTE — Patient Instructions (Signed)
Moriah Cockerham has a hearing loss in each ear that will adversely affect communication.  She has a mild to moderate high frequency sensorineural hearing loss bilaterally with recruitment. Artina has excellent word recognition in quiet that drops to poor on the left and fair on the right in minimal background noise. There was no tinnitus during the evaluation so it was not measured.    RECOMMENDATION: 1)  Referral to ENT for a) intermittent hollow feeling on the right side b) intermittent tinnitus and c) constant year round sinus issues. 2) Referal for PT - a balance assessment - fell in April, painful knees.    3) Strategies that help improve hearing include: A) Face the speaker directly. Optimal is having the speakers face well - lit.  Unless amplified, being within 3-6 feet of the speaker will enhance word recognition. B) Avoid having the speaker back-lit as this will minimize the ability to use cues from lip-reading, facial expression and gestures. C)  Word recognition is poorer in background noise. For optimal word recognition, turn off the TV, radio or noisy fan when engaging in conversation. In a restaurant, try to sit away from noise sources and close to the primary speaker.  D)  Ask for topic clarification from time to time in order to remain in the conversation.  Most people don't mind repeating or clarifying a point when asked.  If needed, explain the difficulty hearing in background noise or hearing loss.  Arvella Massingale L. Heide Spark, Au.D., CCC-A Doctor of Audiology 09/29/2017

## 2017-09-29 NOTE — Procedures (Signed)
Outpatient Audiology and [place]  [place], [place] [address]  [phone]   Audiological Evaluation  Patient Name: [name] [name]   Status: Outpatient   DOB: [birth year]    Diagnosis: Bilateral Hearing Loss, unspecified type               Tinnitus MRN: [id number] Date:  09/29/2017     Referent: Hoyt Koch, MD  History: Rigby Swamy was seen for an audiological evaluation.  Primary Concern: "Hearing problems with some ringing in the ears." Pain: None History of hearing problems: Y - recent development. History of ear infections:  N History of ear surgery or "tubes" : N History of dizziness/vertigo:   Y - when standing and sitting in chair primarily. Not when rolling over in bed. History of balance issues:  Y - but needs bilateral knee replacement. Currently has painful knees. Tinnitus: Y - "low pitched and intermittent".  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 History of hypertension: Y - controlled with medication. History of diabetes:  Y- controlled with medication uses "metforman" daily. Family history of hearing loss:  N   Evaluation: Conventional pure tone audiometry from 250Hz  - 8000Hz  with using insert earphones.  Hearing Thresholds are symmetrical ranging fro m 35-40 dBHL at 250Hz ; 20-25 dBHL from 500Hz  - 1000Hz ; 30 dBHL at 2000Hz ; 30-40 dBHL at 4000Hz  and 45-50 dBHL at 8000Hz . The hearing loss is sensorineural bilaterally.  Reliability is good Speech reception levels (repeating words near threshold) using recorded spondee word lists:  Right ear: 25 dBHL.  Left ear:  20 dBHL Word recognition (at comfortably loud volumes) using recorded NU-6 word lists at 60 dBHL, in quiet with 50 dBHL contralateral masking using speech noise.  Right ear: 96%.  Left ear:   96% Word recognition in minimal background noise using PBK word lists in multitalker noise:  +5 dBHL   Right ear: 72%                              Left ear:  50%  Tympanometry shows normal middle ear volume, pressure and compliance (Type A) with 80-85dB ipsilateral acoustic reflexes from 500Hz  -4000Hz  bilaterally. Acoustic reflex decay is strongly negative bilaterally at 1000Hz  bilaterally.  Uncomfortable Loudness Levels using monitored live noise are 90dBHL on the right and 95dBHL on the left. Recruitment is present bilaterally. Tinnitus matching could not be completed today because she was not experiencing it.    CONCLUSION:  Aryn Gentile has a hearing loss in each ear that will adversely affect communication.  She has a mild to moderate high frequency sensorineural hearing loss bilaterally with recruitment. Lanasia has excellent word recognition in quiet that drops to poor on the left and fair on the right in minimal background noise. There was no tinnitus during the evaluation so it was not measured. Kiana Hermann needs further evaluation by an ENT prior to a hearing aid evaluation.   RECOMMENDATION: 1)  Referral to ENT for a) intermittent hollow feeling on the right side b) intermittent tinnitus c) constant year round sinus issues and d) reports of balance/vertigo issues. 2)  Referal for PT - a balance assessment - fell in April, painful knees.    3) Strategies that help improve hearing include: A) Face the speaker directly. Optimal is having the speakers face well - lit.  Unless amplified, being within 3-6 feet of the  speaker will enhance word recognition. B) Avoid having the speaker back-lit as this will minimize the ability to use cues from lip-reading, facial expression and gestures. C)  Word recognition is poorer in background noise. For optimal word recognition, turn off the TV, radio or noisy fan when engaging in conversation. In a restaurant, try to sit away from noise sources and close to the primary speaker.  D)  Ask for topic clarification from time to time in order to  remain in the conversation.  Most people don't mind repeating or clarifying a point when asked.  If needed, explain the difficulty hearing in background noise or hearing loss.  4) Monitor hearing, vertigo and tinnitus with a repeat hearing evaluation in 6-12 months - earlier if there are changes or concerns. 5) A hearing aid evaluation may be needed by Lennix Wayson needs ENT evaluation first.  Neoma Laming L. Heide Spark Au.D., CCC-A Doctor of Audiology 09/29/2017       cc: Hoyt Koch, MD

## 2017-10-01 ENCOUNTER — Other Ambulatory Visit: Payer: Self-pay | Admitting: Internal Medicine

## 2017-10-01 DIAGNOSIS — M25561 Pain in right knee: Principal | ICD-10-CM

## 2017-10-01 DIAGNOSIS — H9319 Tinnitus, unspecified ear: Secondary | ICD-10-CM

## 2017-10-01 DIAGNOSIS — M25562 Pain in left knee: Principal | ICD-10-CM

## 2017-10-01 DIAGNOSIS — G8929 Other chronic pain: Secondary | ICD-10-CM

## 2017-10-11 ENCOUNTER — Ambulatory Visit (INDEPENDENT_AMBULATORY_CARE_PROVIDER_SITE_OTHER): Payer: PPO | Admitting: Internal Medicine

## 2017-10-11 ENCOUNTER — Other Ambulatory Visit: Payer: Self-pay | Admitting: *Deleted

## 2017-10-11 ENCOUNTER — Encounter: Payer: Self-pay | Admitting: Internal Medicine

## 2017-10-11 ENCOUNTER — Other Ambulatory Visit (INDEPENDENT_AMBULATORY_CARE_PROVIDER_SITE_OTHER): Payer: PPO

## 2017-10-11 VITALS — BP 132/70 | HR 110 | Temp 97.9°F | Ht 71.0 in | Wt 277.0 lb

## 2017-10-11 DIAGNOSIS — E119 Type 2 diabetes mellitus without complications: Secondary | ICD-10-CM

## 2017-10-11 DIAGNOSIS — I10 Essential (primary) hypertension: Secondary | ICD-10-CM

## 2017-10-11 DIAGNOSIS — I471 Supraventricular tachycardia: Secondary | ICD-10-CM | POA: Diagnosis not present

## 2017-10-11 DIAGNOSIS — R112 Nausea with vomiting, unspecified: Secondary | ICD-10-CM | POA: Diagnosis not present

## 2017-10-11 DIAGNOSIS — R197 Diarrhea, unspecified: Secondary | ICD-10-CM | POA: Insufficient documentation

## 2017-10-11 LAB — COMPREHENSIVE METABOLIC PANEL
ALT: 14 U/L (ref 0–35)
AST: 18 U/L (ref 0–37)
Albumin: 3.5 g/dL (ref 3.5–5.2)
Alkaline Phosphatase: 59 U/L (ref 39–117)
BUN: 55 mg/dL — ABNORMAL HIGH (ref 6–23)
CO2: 19 mEq/L (ref 19–32)
Calcium: 9.5 mg/dL (ref 8.4–10.5)
Chloride: 103 mEq/L (ref 96–112)
Creatinine, Ser: 1.48 mg/dL — ABNORMAL HIGH (ref 0.40–1.20)
GFR: 44.71 mL/min — ABNORMAL LOW (ref >60.00)
Glucose, Bld: 149 mg/dL — ABNORMAL HIGH (ref 70–99)
Potassium: 4.4 mEq/L (ref 3.5–5.1)
Sodium: 133 mEq/L — ABNORMAL LOW (ref 135–145)
Total Bilirubin: 0.4 mg/dL (ref 0.2–1.2)
Total Protein: 8.8 g/dL — ABNORMAL HIGH (ref 6.0–8.3)

## 2017-10-11 LAB — VITAMIN D 25 HYDROXY (VIT D DEFICIENCY, FRACTURES): VITD: 12.21 ng/mL — ABNORMAL LOW (ref 30.00–100.00)

## 2017-10-11 LAB — CBC
HCT: 29.8 % — ABNORMAL LOW (ref 36.0–46.0)
Hemoglobin: 9.9 g/dL — ABNORMAL LOW (ref 12.0–15.0)
MCHC: 33.2 g/dL (ref 30.0–36.0)
MCV: 72.4 fl — ABNORMAL LOW (ref 78.0–100.0)
Platelets: 563 10*3/uL — ABNORMAL HIGH (ref 150.0–400.0)
RBC: 4.12 Mil/uL (ref 3.87–5.11)
RDW: 18.2 % — ABNORMAL HIGH (ref 11.5–15.5)
WBC: 8.2 10*3/uL (ref 4.0–10.5)

## 2017-10-11 LAB — HEMOGLOBIN A1C: Hgb A1c MFr Bld: 7.1 % — ABNORMAL HIGH (ref 4.6–6.5)

## 2017-10-11 LAB — VITAMIN B12: Vitamin B-12: >1500 pg/mL — ABNORMAL HIGH (ref 211–911)

## 2017-10-11 LAB — LIPASE: Lipase: 22 U/L (ref 11.0–59.0)

## 2017-10-11 MED ORDER — ONDANSETRON HCL 4 MG PO TABS: 4.0000 mg | ORAL_TABLET | Freq: Three times a day (TID) | ORAL | 0 refills | Status: DC | PRN

## 2017-10-11 MED ORDER — MONTELUKAST SODIUM 10 MG PO TABS: 10.0000 mg | ORAL_TABLET | Freq: Every day | ORAL | 3 refills | Status: DC

## 2017-10-11 MED ORDER — ONDANSETRON 4 MG PO TBDP
4.0000 mg | ORAL_TABLET | Freq: Once | ORAL | Status: AC
  Administered 2017-10-11: 4 mg via ORAL

## 2017-10-11 MED ORDER — METOPROLOL TARTRATE 50 MG PO TABS: 50.0000 mg | ORAL_TABLET | Freq: Two times a day (BID) | ORAL | 1 refills | Status: DC

## 2017-10-11 MED ORDER — LOSARTAN POTASSIUM 100 MG PO TABS: 100.0000 mg | ORAL_TABLET | Freq: Every day | ORAL | 3 refills | Status: DC

## 2017-10-11 MED ORDER — PANTOPRAZOLE SODIUM 40 MG PO TBEC: 40.0000 mg | DELAYED_RELEASE_TABLET | Freq: Every day | ORAL | 3 refills | Status: DC

## 2017-10-11 NOTE — Assessment & Plan Note (Signed)
Checking HgA1c today, taking metformin although off for several weeks. Previously well controlled. Not eating much lately due to nausea/vomiting/diarrhea.

## 2017-10-11 NOTE — Progress Notes (Signed)
   Subjective:    Patient ID: [name] [name], female    DOB: [birth year], 72 y.o.   MRN: [id number]  HPI The patient is a 72 YO female coming in for follow up of her diabetes (has not taken metformin in several weeks due to illness, denies numbness in hands or feet, denies checking sugars at all) and her new problem of nausea/diarrhea (she has not eaten anything solid since Friday, is not taking her allergy medicine or gerd medicine in the last several weeks, had a popsicle yesterday and some water, is feeling very weak, is throwing up mostly bile if she tries to eat, she is having liquid stools during the same time course, denies change to diet, travel, medications prior to onset of symptoms, no stomach pain or bloating, denies fevers or chills, no dysuria or signs of uti) and her blood pressure (she is out of several blood pressure medicines, is not taking anything in the last 2 weeks due to sickness and being out of some, denies headaches or chest pains or SOB).   Review of Systems  Constitutional: Positive for activity change. Negative for appetite change, chills, fatigue, fever and unexpected weight change.  Respiratory: Negative.   Cardiovascular: Negative.   Gastrointestinal: Positive for diarrhea, nausea and vomiting. Negative for abdominal distention, abdominal pain, anal bleeding, blood in stool, constipation and rectal pain.  Musculoskeletal: Positive for arthralgias, gait problem and myalgias. Negative for back pain and joint swelling.  Skin: Negative.   Neurological: Positive for dizziness and light-headedness. Negative for seizures, syncope, speech difficulty, weakness and headaches.      Objective:   Physical Exam  Constitutional: She is oriented to person, place, and time. She appears well-developed and well-nourished.  HENT:  Head: Normocephalic and atraumatic.  Eyes: EOM are normal.  Neck: Normal range of motion.  Cardiovascular: Normal rate and regular rhythm.    Pulmonary/Chest: Effort normal and breath sounds normal. No respiratory distress. She has no wheezes. She has no rales.  Abdominal: Soft. Bowel sounds are normal. She exhibits no distension. There is tenderness. There is no rebound.  Some epigastric tenderness, no rebound or guarding, BS normal. No distention.  Musculoskeletal: She exhibits edema.  Stable bilateral leg swelling  Neurological: She is alert and oriented to person, place, and time. Coordination abnormal.  Walker with seat  Skin: Skin is warm and dry.  Psychiatric: She has a normal mood and affect.   Vitals:   10/11/17 0749  BP: 132/70  Pulse: (!) 110  Temp: 97.9 F (36.6 C)  TempSrc: Oral  SpO2: 97%  Weight: 277 lb (125.6 kg)  Height: 5\' 11"  (1.803 m)      Assessment & Plan:

## 2017-10-11 NOTE — Patient Outreach (Addendum)
[facility]) Care Management  10/11/2017   [name] [name] Sep 29, 1945 [id number]  RN Health Coach telephone call to patient.  [employer] compliance verified. Per patient she was waiting on her SCAT ride to pick her up form the Dr office.Per patient she has been nauseated and vomiting and had some diarrhea. Patient does not check blood sugars  so she did not know what  Her blood sugar was. Per patient she has lost some weight. Patient stated her appetite is poor. Patient stated she had a hearing test and has hearing loss in the lt ear. Patient has agreed to follow up outreach calls.   Current Medications:  Current Outpatient Medications  Medication Sig Dispense Refill  . acetaminophen (TYLENOL) 325 MG tablet Take 2 tablets (650 mg total) by mouth every 6 (six) hours as needed for mild pain (or Fever >/= 101).    Marland Kitchen aspirin EC 81 MG tablet Take 81 mg by mouth daily with breakfast. Takes once or twice weekly    . losartan (COZAAR) 100 MG tablet Take 1 tablet (100 mg total) by mouth daily. 90 tablet 3  . metFORMIN (GLUCOPHAGE) 500 MG tablet TAKE 1 TABLET BY MOUTH DAILY WITH BREAKFAST. 90 tablet 3  . methocarbamol (ROBAXIN) 500 MG tablet Take 1 tablet (500 mg total) by mouth every 6 (six) hours as needed for muscle spasms. 30 tablet 0  . metoprolol tartrate (LOPRESSOR) 50 MG tablet Take 1 tablet (50 mg total) by mouth 2 (two) times daily. 180 tablet 1  . montelukast (SINGULAIR) 10 MG tablet Take 1 tablet (10 mg total) by mouth at bedtime. 90 tablet 3  . Neomycin-Bacitracin-Polymyxin ([employer] ANTIBIOTIC) [phone] OINT APPLY AFTER CLEANING BEFORE WRAPPING LGS 453.9 g 0  . ondansetron (ZOFRAN) 4 MG tablet Take 1 tablet (4 mg total) by mouth every 8 (eight) hours as needed for nausea or vomiting. 45 tablet 0  . pantoprazole (PROTONIX) 40 MG tablet Take 1 tablet (40 mg total) by mouth daily. 90 tablet 3   No current facility-administered medications for this visit.     Functional Status:   In your present state of health, do you have any difficulty performing the following activities: 08/02/2017 06/02/2017  Hearing? N N  Vision? N N  Difficulty concentrating or making decisions? N N  Walking or climbing stairs? Y Y  Dressing or bathing? N N  Doing errands, shopping? N N  Preparing Food and eating ? N N  Using the Toilet? N N  In the past six months, have you accidently leaked urine? N N  Do you have problems with loss of bowel control? N N  Managing your Medications? N N  Managing your Finances? N N  Housekeeping or managing your Housekeeping? N N  Some recent data might be hidden    Fall/Depression Screening: Fall Risk  10/11/2017 09/10/2017 08/02/2017  Falls in the past year? Yes Yes Yes  Number falls in past yr: '1 1 1  ' Injury with Fall? No No No  Risk Factor Category  - - -  Comment - - -  Risk for fall due to : History of fall(s);Impaired balance/gait;Impaired mobility History of fall(s);Impaired balance/gait;Impaired mobility History of fall(s);Impaired balance/gait;Impaired mobility  Risk for fall due to: Comment - - -  Follow up - Falls evaluation completed;Falls prevention discussed;Education provided Falls evaluation completed;Falls prevention discussed;Education provided   [facility] 2/9 Scores 10/11/2017 09/10/2017 08/02/2017 06/02/2017 05/20/2017 05/11/2017 04/12/2017  PHQ - 2 Score 0 0 0 0 0 0 0  PHQ- 9 Score - - - - - 3 -   THN CM Care Plan Problem One     Most Recent Value  Care Plan Problem One  Knowledge deficit in self management of Diabetes  Role Documenting the Problem One  [place] for Problem One  Active  THN Long Term Goal   Patient will have a decrease in A1C  from 6.6 within the next 90 days  THN Long Term Goal Start Date  10/11/17  Interventions for Problem One Long Term Goal  RN discussed what A1C is. RN discussed eating healthy foods and snacks. RN will follow up for next blood draw  THN CM Short Term Goal #1   patient will have transportion  issues resolved within the next 30 days  THN CM Short Term Goal #1 Met Date  10/11/17  THN CM Short Term Goal #2   Patyient will have abetter understanding of liquids and food intake for sick days  THN CM Short Term Goal #2 Start Date  10/11/17  Interventions for Short Term Goal #2  Rn discussed sick day plan. RN sent educational material on sick days and food and liquid intake. RN will follow up with further dsicussion and teach back  THN CM Short Term Goal #3  Patient will not have any falls within the next 30 days  THN CM Short Term Goal #5 Met Date  10/11/17       Assessment:  Patient has poor appetite, diarrhea and nausea and vomiting.  Patient went to Dr office today for symptoms Patient A1C 7.1 Patient will continue to benefit from Health Coach telephonic outreach for education and support for diabetes self management.  Plan:  RN discussed Diabetic sick days RN sent patient educational material on Diabetic sick days Patient will follow up with dx of hearing loss RN will follow up within the month of August  [name] Care Management [phone]

## 2017-10-11 NOTE — Patient Instructions (Signed)
We are checking the labs today and will call you back about the results.   We have sent in refills of the losartan which is the blood pressure medicine.   We have sent in protonix to take 1 pill daily in the morning for the heart burn to help this.   We have sent in singulair for the allergies and you can take zyrtec (cetirizine) over the counter as well.   We have sent in nausea medicine called zofran that you can take for nausea.

## 2017-10-11 NOTE — Assessment & Plan Note (Signed)
It is unclear the etiology at this time. She may have loose stools due to not eating any solids and low intake of food at this time. Will rx zofran for nausea and see if this can encourage food intake and normal stools. If not then needs to do stool pathogen panel to check for pathogens.

## 2017-10-11 NOTE — Assessment & Plan Note (Signed)
BP is at goal today off all medications although she is not eating at all right now. Will stop amlodipine 10 mg daily since she has likely been out for more than 6 months. Will continue metoprolol and losartan and re-evaluate at next visit control. Checking CMP today.

## 2017-10-11 NOTE — Assessment & Plan Note (Signed)
Possibly related to extra allergy drainage and stopping PPI. Not likely viral illness for this long. Rx for singulair and protonix today as well as zofran. Given zofran in the office today for nausea. Checking CMP, CBC, lipase.

## 2017-10-13 ENCOUNTER — Ambulatory Visit: Payer: Self-pay

## 2017-10-13 NOTE — Telephone Encounter (Signed)
It is impossible to tell which medication is causing the rash.  Stop all three for now - should be seen tomorrow for evaluation.

## 2017-10-13 NOTE — Telephone Encounter (Signed)
Please advise per Dr. Crawford's absence. Thank you  

## 2017-10-13 NOTE — Telephone Encounter (Signed)
appt made for 7/12

## 2017-10-13 NOTE — Telephone Encounter (Signed)
Can you schedule patient an appointment for tomorrow to evaluate the medications and the rash that has occurred and to stop the medications for now. Thank you

## 2017-10-13 NOTE — Telephone Encounter (Signed)
Pt. Reports she was seen by her doctor 10/11/17 and given "Three new medicines" and feels she is having a reaction to one of these. Medicines are:  Protonix,Zofran and Singulair.Has a rash - "fine little bumps on my arms and chest and back." Itchy. Having dizziness and weakness as well.Would like some suggestions from her doctor.Please advise pt.  Reason for Disposition . Hives or itching  Answer Assessment - Initial Assessment Questions 1. APPEARANCE of RASH: "Describe the rash." (e.g., spots, blisters, raised areas, skin peeling, scaly)     Fine bumps 2. SIZE: "How big are the spots?" (e.g., tip of pen, eraser, coin; inches, centimeters)     Small - head of a pen 3. LOCATION: "Where is the rash located?"     Arms, trunk 4. COLOR: "What color is the rash?" (Note: It is difficult to assess rash color in people with darker-colored skin. When this situation occurs, simply ask the caller to describe what they see.)     A little red 5. ONSET: "When did the rash begin?"     Started yesterday after"I took my new medicine." 6. FEVER: "Do you have a fever?" If so, ask: "What is your temperature, how was it measured, and when did it start?"     No 7. ITCHING: "Does the rash itch?" If so, ask: "How bad is the itch?" (Scale 1-10; or mild, moderate, severe)     Moderate 8. CAUSE: "What do you think is causing the rash?"     My new medicine 9. NEW MEDICATION: "What new medication are you taking?" (e.g., name of antibiotic) "When did you start taking this medication?".     Zofran, Singulair, Protonix 10. OTHER SYMPTOMS: "Do you have any other symptoms?" (e.g., sore throat, fever, joint pain)       No 11. PREGNANCY: "Is there any chance you are pregnant?" "When was your last menstrual period?"       No  Protocols used: RASH - WIDESPREAD ON DRUGS-A-AH

## 2017-10-15 ENCOUNTER — Inpatient Hospital Stay: Admission: RE | Admit: 2017-10-15 | Payer: PPO | Source: Ambulatory Visit

## 2017-10-15 ENCOUNTER — Ambulatory Visit (INDEPENDENT_AMBULATORY_CARE_PROVIDER_SITE_OTHER): Payer: PPO | Admitting: Internal Medicine

## 2017-10-15 ENCOUNTER — Encounter: Payer: Self-pay | Admitting: Internal Medicine

## 2017-10-15 ENCOUNTER — Other Ambulatory Visit (INDEPENDENT_AMBULATORY_CARE_PROVIDER_SITE_OTHER): Payer: PPO

## 2017-10-15 VITALS — BP 96/60 | HR 104 | Temp 97.6°F | Ht 71.0 in | Wt 273.0 lb

## 2017-10-15 DIAGNOSIS — R197 Diarrhea, unspecified: Secondary | ICD-10-CM

## 2017-10-15 DIAGNOSIS — R112 Nausea with vomiting, unspecified: Secondary | ICD-10-CM | POA: Diagnosis not present

## 2017-10-15 LAB — CBC
HCT: 31.5 % — ABNORMAL LOW (ref 36.0–46.0)
Hemoglobin: 10.2 g/dL — ABNORMAL LOW (ref 12.0–15.0)
MCHC: 32.5 g/dL (ref 30.0–36.0)
MCV: 73.1 fl — ABNORMAL LOW (ref 78.0–100.0)
Platelets: 555 10*3/uL — ABNORMAL HIGH (ref 150.0–400.0)
RBC: 4.31 Mil/uL (ref 3.87–5.11)
RDW: 18.3 % — ABNORMAL HIGH (ref 11.5–15.5)
WBC: 10.7 10*3/uL — ABNORMAL HIGH (ref 4.0–10.5)

## 2017-10-15 LAB — COMPREHENSIVE METABOLIC PANEL
ALT: 12 U/L (ref 0–35)
AST: 14 U/L (ref 0–37)
Albumin: 3.4 g/dL — ABNORMAL LOW (ref 3.5–5.2)
Alkaline Phosphatase: 74 U/L (ref 39–117)
BUN: 61 mg/dL — ABNORMAL HIGH (ref 6–23)
CO2: 18 mEq/L — ABNORMAL LOW (ref 19–32)
Calcium: 9.5 mg/dL (ref 8.4–10.5)
Chloride: 103 mEq/L (ref 96–112)
Creatinine, Ser: 2.2 mg/dL — ABNORMAL HIGH (ref 0.40–1.20)
GFR: 28.3 mL/min — ABNORMAL LOW (ref >60.00)
Glucose, Bld: 173 mg/dL — ABNORMAL HIGH (ref 70–99)
Potassium: 4.9 mEq/L (ref 3.5–5.1)
Sodium: 133 mEq/L — ABNORMAL LOW (ref 135–145)
Total Bilirubin: 0.4 mg/dL (ref 0.2–1.2)
Total Protein: 9 g/dL — ABNORMAL HIGH (ref 6.0–8.3)

## 2017-10-15 LAB — LIPASE: Lipase: 18 U/L (ref 11.0–59.0)

## 2017-10-15 MED ORDER — PROMETHAZINE HCL 25 MG/ML IJ SOLN
25.0000 mg | Freq: Once | INTRAMUSCULAR | Status: AC
  Administered 2017-10-15: 25 mg via INTRAMUSCULAR

## 2017-10-15 MED ORDER — PROMETHAZINE HCL 25 MG PO TABS: 25.0000 mg | ORAL_TABLET | Freq: Three times a day (TID) | ORAL | 0 refills | Status: DC | PRN

## 2017-10-15 NOTE — Assessment & Plan Note (Signed)
Unclear etiology and severe at this time. She is down weight and some AKI on last labs. Zofran did not help with nausea. Given phenergan today in the office. Rechecking lipase, CBC, CMP for any changes. If persistent or worsening AKI needs to go to ER for fluids. CT abdomen ordered stat today given persistence of symptoms and weight loss due to lack of being able to eat. Rx for phenergan pills as well for nausea.

## 2017-10-15 NOTE — Patient Instructions (Addendum)
We have given you a shot of phenergan which is a medicine for nausea today.   We have sent in the pills of phenergan to help with the nausea.   We are doing blood work today and if the dehydration in the kidneys is worse we will send you to the hospital or urgent care to get fluids.   We are getting a scan of the stomach to see what is causing the problem.

## 2017-10-15 NOTE — Progress Notes (Signed)
   Subjective:    Patient ID: [name] [name], female    DOB: May 26, 1945, 72 y.o.   MRN: [id number]  HPI The patient is a 72 YO female coming in for continuing severe problem of nausea and vomiting. She cannot keep anything down much. She is drinking water and some popsicles which can stay down. She is not eating any solids. No BM for the last 2-3 days. Previous to this she was having liquid stools. She denies abdominal pain or swelling. She denies blood in stools or vomiting. She is down about 10 pounds during this. Started about 1-2 weeks ago. Overall worsening. She is dizzy and weak currently. She is not taking any blood pressure or diabetes medicines now since she is sick. She tried zofran which did not help. We also gave her protonix and singulair to help with some drainage and GERD to see if this helped her underlying vomiting and nausea. She was checked for pancreatitis last visit 4 days ago and did not have this. She got a rash with taking these new medications and stopped taking all of them. She did not get any relief with them. She is currently taking pepto bismol for nausea which sometimes helps to prevent her vomiting.   Review of Systems  Constitutional: Positive for activity change, appetite change, fatigue and unexpected weight change.  HENT: Negative.   Eyes: Negative.   Respiratory: Negative for cough, chest tightness and shortness of breath.   Cardiovascular: Negative for chest pain, palpitations and leg swelling.  Gastrointestinal: Positive for nausea and vomiting. Negative for abdominal distention, abdominal pain, constipation and diarrhea.  Musculoskeletal: Negative.   Skin: Positive for rash.  Neurological: Negative.   Psychiatric/Behavioral: Negative.       Objective:   Physical Exam  Constitutional: She is oriented to person, place, and time. She appears well-developed and well-nourished.  HENT:  Head: Normocephalic and atraumatic.  Eyes: EOM are normal.  Neck: Normal  range of motion.  Cardiovascular: Normal rate and regular rhythm.  Pulmonary/Chest: Effort normal and breath sounds normal. No respiratory distress. She has no wheezes. She has no rales.  Abdominal: Soft. Bowel sounds are normal. She exhibits no distension. There is no tenderness. There is no rebound.  Discomfort in her stomach but no pain, BS normal, no guarding  Musculoskeletal: She exhibits no edema.  Neurological: She is alert and oriented to person, place, and time. Coordination normal.  Skin: Skin is warm and dry. Rash noted.  Macular rash on the right forearm with some itching.  Psychiatric: She has a normal mood and affect.   Vitals:   10/15/17 0911  BP: 96/60  Pulse: (!) 104  Temp: 97.6 F (36.4 C)  TempSrc: Oral  SpO2: 99%  Weight: 273 lb (123.8 kg)  Height: 5\' 11"  (1.803 m)      Assessment & Plan:  Phenergan 25 mg IM given at visit

## 2017-10-15 NOTE — Assessment & Plan Note (Addendum)
This has resolved since last visit and C dif testing is not required. She is not eating solids and has had only small BM in the last 4 days. No loose stools. Denies blood in stool.

## 2017-10-16 ENCOUNTER — Other Ambulatory Visit: Payer: Self-pay

## 2017-10-16 ENCOUNTER — Emergency Department (HOSPITAL_COMMUNITY): Payer: PPO

## 2017-10-16 ENCOUNTER — Ambulatory Visit (INDEPENDENT_AMBULATORY_CARE_PROVIDER_SITE_OTHER)
Admission: EM | Admit: 2017-10-16 | Discharge: 2017-10-16 | Disposition: A | Discharge status: Home / Self Care | Payer: PPO | Source: Home / Self Care | Attending: Internal Medicine | Admitting: Internal Medicine

## 2017-10-16 ENCOUNTER — Inpatient Hospital Stay (HOSPITAL_COMMUNITY)
Admission: EM | Admit: 2017-10-16 | Discharge: 2017-10-18 | DRG: 683 | Disposition: A | Discharge status: Home Health | Payer: PPO | Source: Ambulatory Visit | Attending: Internal Medicine | Admitting: Internal Medicine

## 2017-10-16 ENCOUNTER — Encounter (HOSPITAL_COMMUNITY): Payer: Self-pay

## 2017-10-16 DIAGNOSIS — R42 Dizziness and giddiness: Secondary | ICD-10-CM | POA: Diagnosis not present

## 2017-10-16 DIAGNOSIS — R110 Nausea: Secondary | ICD-10-CM | POA: Diagnosis not present

## 2017-10-16 DIAGNOSIS — Z7982 Long term (current) use of aspirin: Secondary | ICD-10-CM

## 2017-10-16 DIAGNOSIS — Z7984 Long term (current) use of oral hypoglycemic drugs: Secondary | ICD-10-CM

## 2017-10-16 DIAGNOSIS — I10 Essential (primary) hypertension: Secondary | ICD-10-CM | POA: Diagnosis present

## 2017-10-16 DIAGNOSIS — Z9119 Patient's noncompliance with other medical treatment and regimen: Secondary | ICD-10-CM

## 2017-10-16 DIAGNOSIS — I350 Nonrheumatic aortic (valve) stenosis: Secondary | ICD-10-CM | POA: Diagnosis not present

## 2017-10-16 DIAGNOSIS — E860 Dehydration: Secondary | ICD-10-CM | POA: Diagnosis not present

## 2017-10-16 DIAGNOSIS — Z833 Family history of diabetes mellitus: Secondary | ICD-10-CM | POA: Diagnosis not present

## 2017-10-16 DIAGNOSIS — R112 Nausea with vomiting, unspecified: Secondary | ICD-10-CM

## 2017-10-16 DIAGNOSIS — N390 Urinary tract infection, site not specified: Secondary | ICD-10-CM | POA: Diagnosis present

## 2017-10-16 DIAGNOSIS — N178 Other acute kidney failure: Secondary | ICD-10-CM | POA: Diagnosis not present

## 2017-10-16 DIAGNOSIS — K5730 Diverticulosis of large intestine without perforation or abscess without bleeding: Secondary | ICD-10-CM | POA: Diagnosis not present

## 2017-10-16 DIAGNOSIS — N179 Acute kidney failure, unspecified: Secondary | ICD-10-CM | POA: Diagnosis not present

## 2017-10-16 DIAGNOSIS — R1084 Generalized abdominal pain: Secondary | ICD-10-CM | POA: Diagnosis not present

## 2017-10-16 DIAGNOSIS — T465X6A Underdosing of other antihypertensive drugs, initial encounter: Secondary | ICD-10-CM | POA: Diagnosis present

## 2017-10-16 DIAGNOSIS — R531 Weakness: Secondary | ICD-10-CM | POA: Diagnosis not present

## 2017-10-16 DIAGNOSIS — R5383 Other fatigue: Secondary | ICD-10-CM

## 2017-10-16 DIAGNOSIS — K219 Gastro-esophageal reflux disease without esophagitis: Secondary | ICD-10-CM | POA: Diagnosis present

## 2017-10-16 DIAGNOSIS — R1111 Vomiting without nausea: Secondary | ICD-10-CM | POA: Diagnosis not present

## 2017-10-16 DIAGNOSIS — K224 Dyskinesia of esophagus: Secondary | ICD-10-CM | POA: Diagnosis not present

## 2017-10-16 DIAGNOSIS — B961 Klebsiella pneumoniae [K. pneumoniae] as the cause of diseases classified elsewhere: Secondary | ICD-10-CM | POA: Diagnosis not present

## 2017-10-16 DIAGNOSIS — D638 Anemia in other chronic diseases classified elsewhere: Secondary | ICD-10-CM | POA: Diagnosis present

## 2017-10-16 DIAGNOSIS — Z87891 Personal history of nicotine dependence: Secondary | ICD-10-CM | POA: Diagnosis not present

## 2017-10-16 DIAGNOSIS — E119 Type 2 diabetes mellitus without complications: Secondary | ICD-10-CM | POA: Diagnosis not present

## 2017-10-16 DIAGNOSIS — A084 Viral intestinal infection, unspecified: Secondary | ICD-10-CM | POA: Diagnosis not present

## 2017-10-16 DIAGNOSIS — I890 Lymphedema, not elsewhere classified: Secondary | ICD-10-CM | POA: Diagnosis present

## 2017-10-16 DIAGNOSIS — R918 Other nonspecific abnormal finding of lung field: Secondary | ICD-10-CM | POA: Diagnosis not present

## 2017-10-16 LAB — CBC WITH DIFFERENTIAL/PLATELET
Abs Immature Granulocytes: 0.1 10*3/uL (ref 0.0–0.1)
Basophils Absolute: 0.1 10*3/uL (ref 0.0–0.1)
Basophils Relative: 1 %
Eosinophils Absolute: 0.2 10*3/uL (ref 0.0–0.7)
Eosinophils Relative: 2 %
HCT: 32.6 % — ABNORMAL LOW (ref 36.0–46.0)
Hemoglobin: 9.9 g/dL — ABNORMAL LOW (ref 12.0–15.0)
Immature Granulocytes: 1 %
Lymphocytes Relative: 12 %
Lymphs Abs: 0.9 10*3/uL (ref 0.7–4.0)
MCH: 23.1 pg — ABNORMAL LOW (ref 26.0–34.0)
MCHC: 30.4 g/dL (ref 30.0–36.0)
MCV: 76 fL — ABNORMAL LOW (ref 78.0–100.0)
Monocytes Absolute: 0.6 10*3/uL (ref 0.1–1.0)
Monocytes Relative: 7 %
Neutro Abs: 5.7 10*3/uL (ref 1.7–7.7)
Neutrophils Relative %: 77 %
Platelets: 469 10*3/uL — ABNORMAL HIGH (ref 150–400)
RBC: 4.29 MIL/uL (ref 3.87–5.11)
RDW: 18.2 % — ABNORMAL HIGH (ref 11.5–15.5)
WBC: 7.4 10*3/uL (ref 4.0–10.5)

## 2017-10-16 LAB — COMPREHENSIVE METABOLIC PANEL
ALT: 16 U/L (ref 0–44)
AST: 22 U/L (ref 15–41)
Albumin: 2.9 g/dL — ABNORMAL LOW (ref 3.5–5.0)
Alkaline Phosphatase: 72 U/L (ref 38–126)
Anion gap: 12 (ref 5–15)
BUN: 58 mg/dL — ABNORMAL HIGH (ref 8–23)
CO2: 18 mmol/L — ABNORMAL LOW (ref 22–32)
Calcium: 9.2 mg/dL (ref 8.9–10.3)
Chloride: 104 mmol/L (ref 98–111)
Creatinine, Ser: 2.01 mg/dL — ABNORMAL HIGH (ref 0.44–1.00)
GFR calc Af Amer: 28 mL/min — ABNORMAL LOW (ref >60)
GFR calc non Af Amer: 24 mL/min — ABNORMAL LOW (ref >60)
Glucose, Bld: 194 mg/dL — ABNORMAL HIGH (ref 70–99)
Potassium: 4.7 mmol/L (ref 3.5–5.1)
Sodium: 134 mmol/L — ABNORMAL LOW (ref 135–145)
Total Bilirubin: 0.5 mg/dL (ref 0.3–1.2)
Total Protein: 8.6 g/dL — ABNORMAL HIGH (ref 6.5–8.1)

## 2017-10-16 LAB — URINALYSIS, ROUTINE W REFLEX MICROSCOPIC
Bilirubin Urine: NEGATIVE
Glucose, UA: NEGATIVE mg/dL
Ketones, ur: NEGATIVE mg/dL
Nitrite: NEGATIVE
Protein, ur: NEGATIVE mg/dL
Specific Gravity, Urine: 1.013 (ref 1.005–1.030)
pH: 5 (ref 5.0–8.0)

## 2017-10-16 LAB — I-STAT CG4 LACTIC ACID, ED
Lactic Acid, Venous: 1.97 mmol/L — ABNORMAL HIGH (ref 0.5–1.9)
Lactic Acid, Venous: 1.45 mmol/L (ref 0.5–1.9)

## 2017-10-16 LAB — GLUCOSE, CAPILLARY: Glucose-Capillary: 91 mg/dL (ref 70–99)

## 2017-10-16 LAB — MAGNESIUM: Magnesium: 2.4 mg/dL (ref 1.7–2.4)

## 2017-10-16 IMAGING — CT CT HEAD W/O CM
4 series · 16 of 47 positions shown, 18 images · non-contrast
Comparison: None.

CLINICAL DATA: Nausea, vomiting, and diarrhea for the last month.
Also having Dizziness. Denies pain

EXAM:
CT HEAD WITHOUT CONTRAST
TECHNIQUE: Contiguous axial images were obtained from the base of the skull
through the vertex without intravenous contrast.

[Series 3: head wo · axial · 0.45mm/px · z∈[-125,-10]mm · 7 of 31 slices shown, 9 images]
[im 4/31  brain]
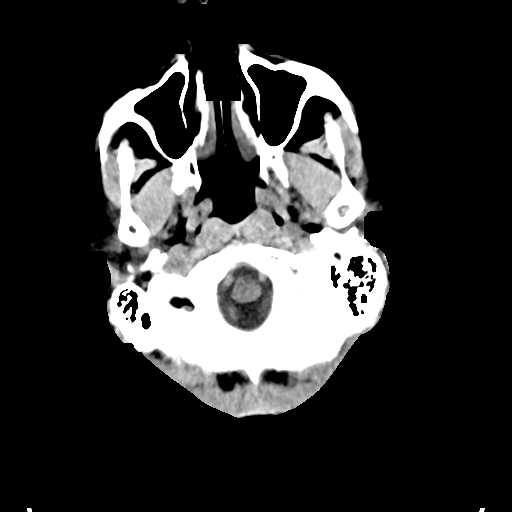
[im 4/31  bone]
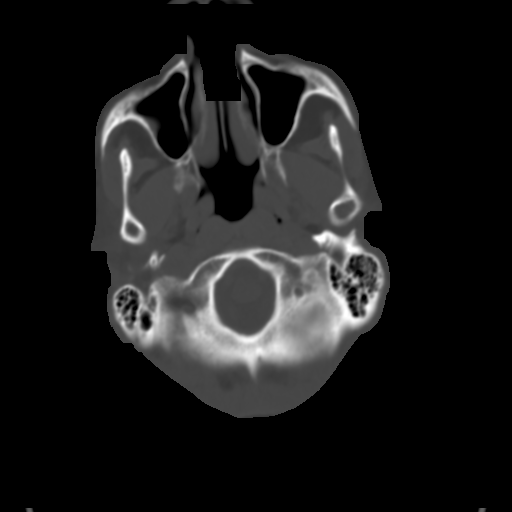
[im 8/31  brain]
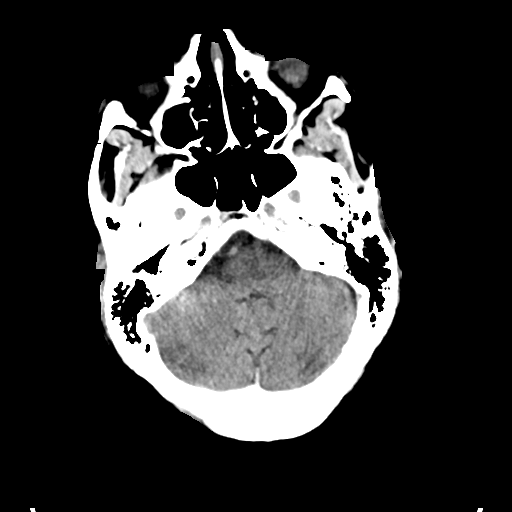
[im 12/31  brain]
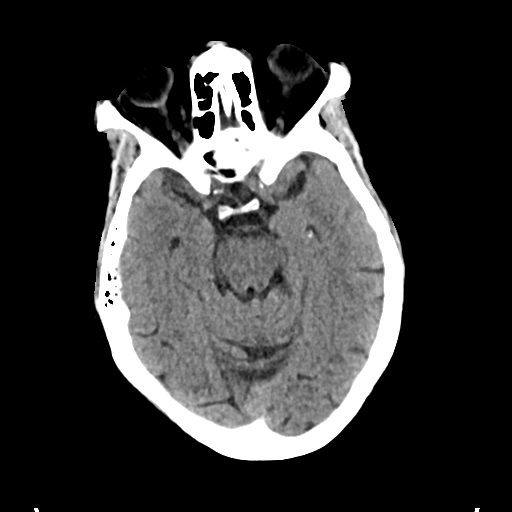
[im 16/31  brain]
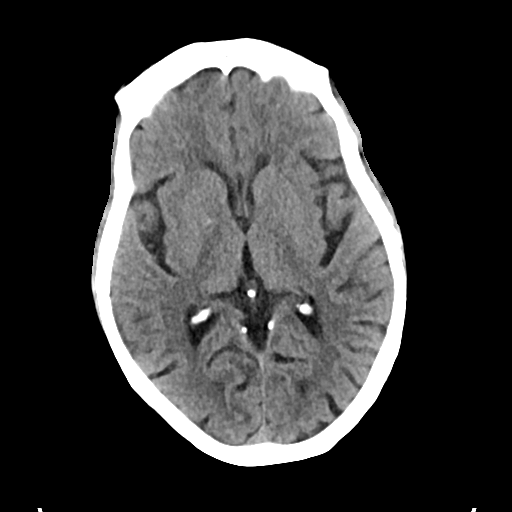
[im 19/31  brain]
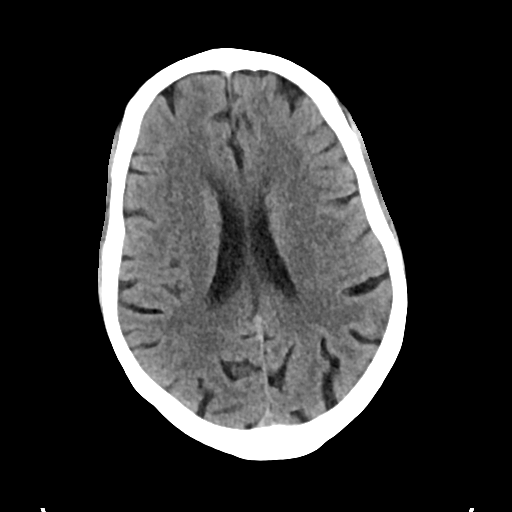
[im 19/31  bone]
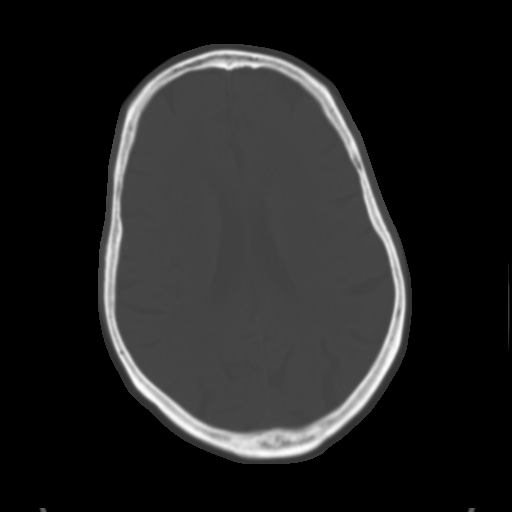
[im 23/31  brain]
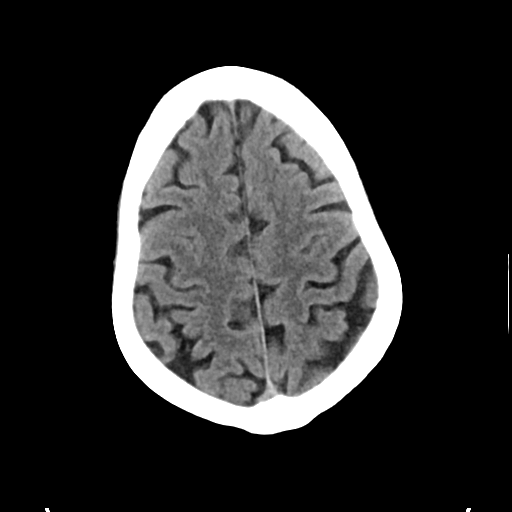
[im 27/31  brain]
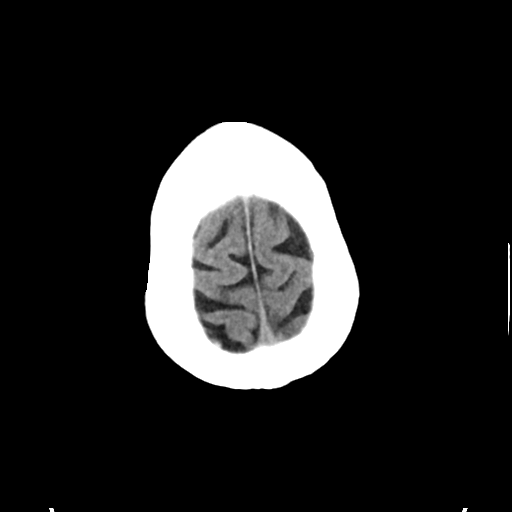

[Series 4: head bone · axial · 0.45mm/px · z∈[-126,-94]mm · 3 of 78 slices shown]
[im 8/78  bone]
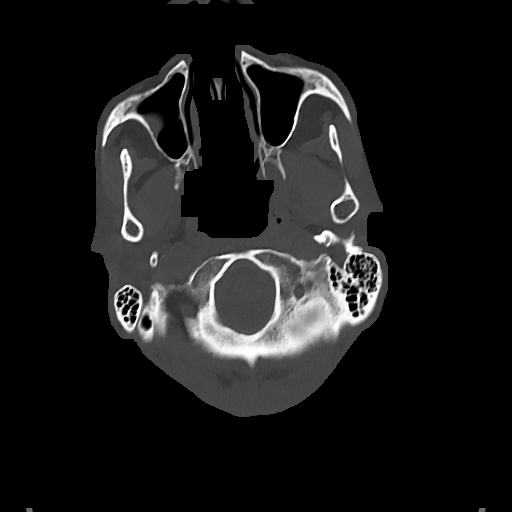
[im 16/78  bone]
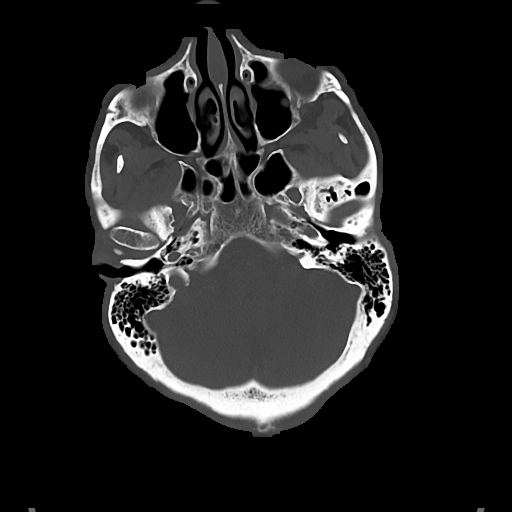
[im 24/78  bone]
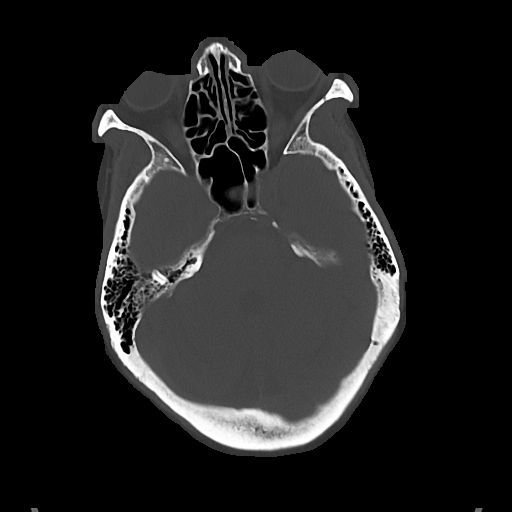

[Series 5: cor soft · coronal · 0.33mm/px · 3 of 66 slices shown]
[im 22/66  brain]
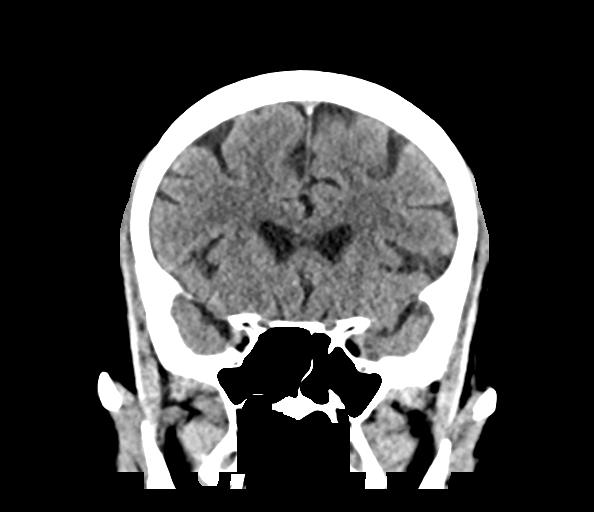
[im 29/66  brain]
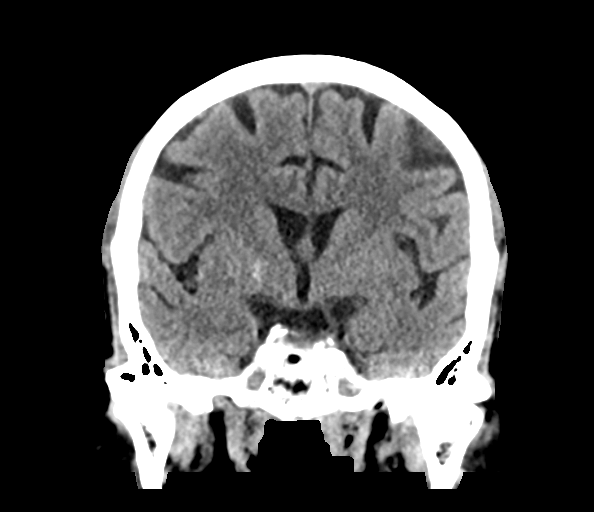
[im 37/66  brain]
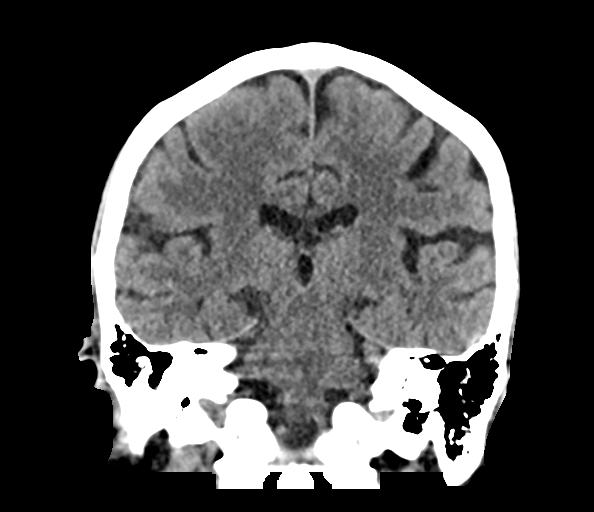

[Series 6: sag soft · sagittal · 0.32mm/px · 3 of 52 slices shown]
[im 18/52  brain]
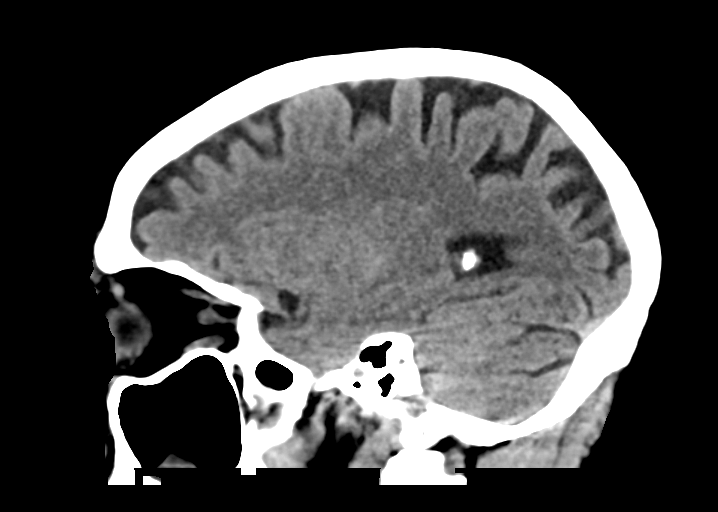
[im 26/52  brain]
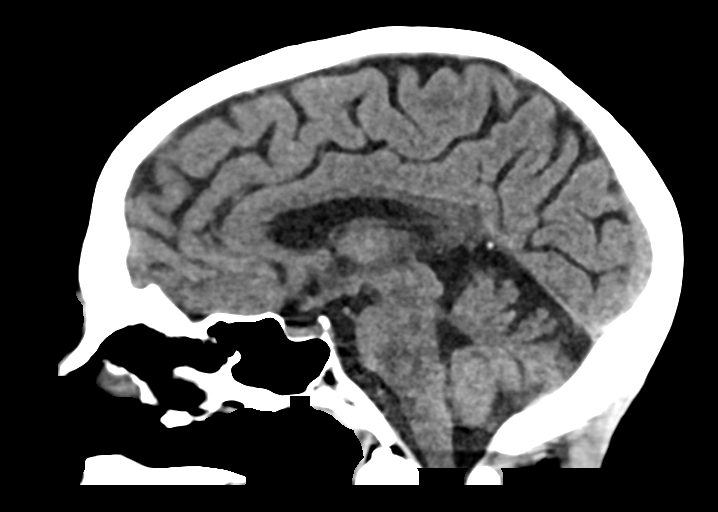
[im 35/52  brain]
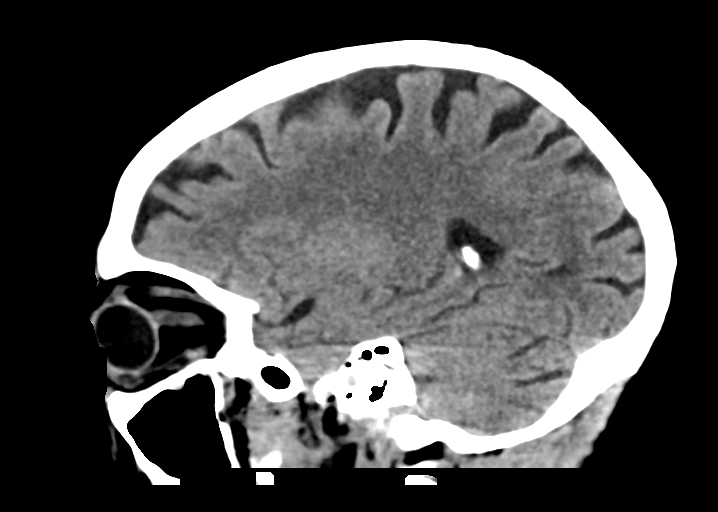

[16 of 47 positions shown; findings below may reference images not displayed]

FINDINGS: Brain: No evidence of acute infarction, hemorrhage, hydrocephalus,
extra-axial collection or mass lesion/mass effect.

Vascular: No hyperdense vessel or unexpected calcification.

Skull: Normal. Negative for fracture or focal lesion.

Sinuses/Orbits: Normal globes and orbits. Mild maxillary sinus
mucosal thickening. Remaining visualized sinuses and mastoid air
cells are clear.

Other: None.
IMPRESSION: 1. No intracranial abnormality.

## 2017-10-16 IMAGING — CR DG CHEST 2V
2 series · 2 of 2 positions shown · non-contrast
Comparison: [DATE]

CLINICAL DATA: Generalized weakness.  Dehydration.

EXAM:
CHEST - 2 VIEW

[chest lat]
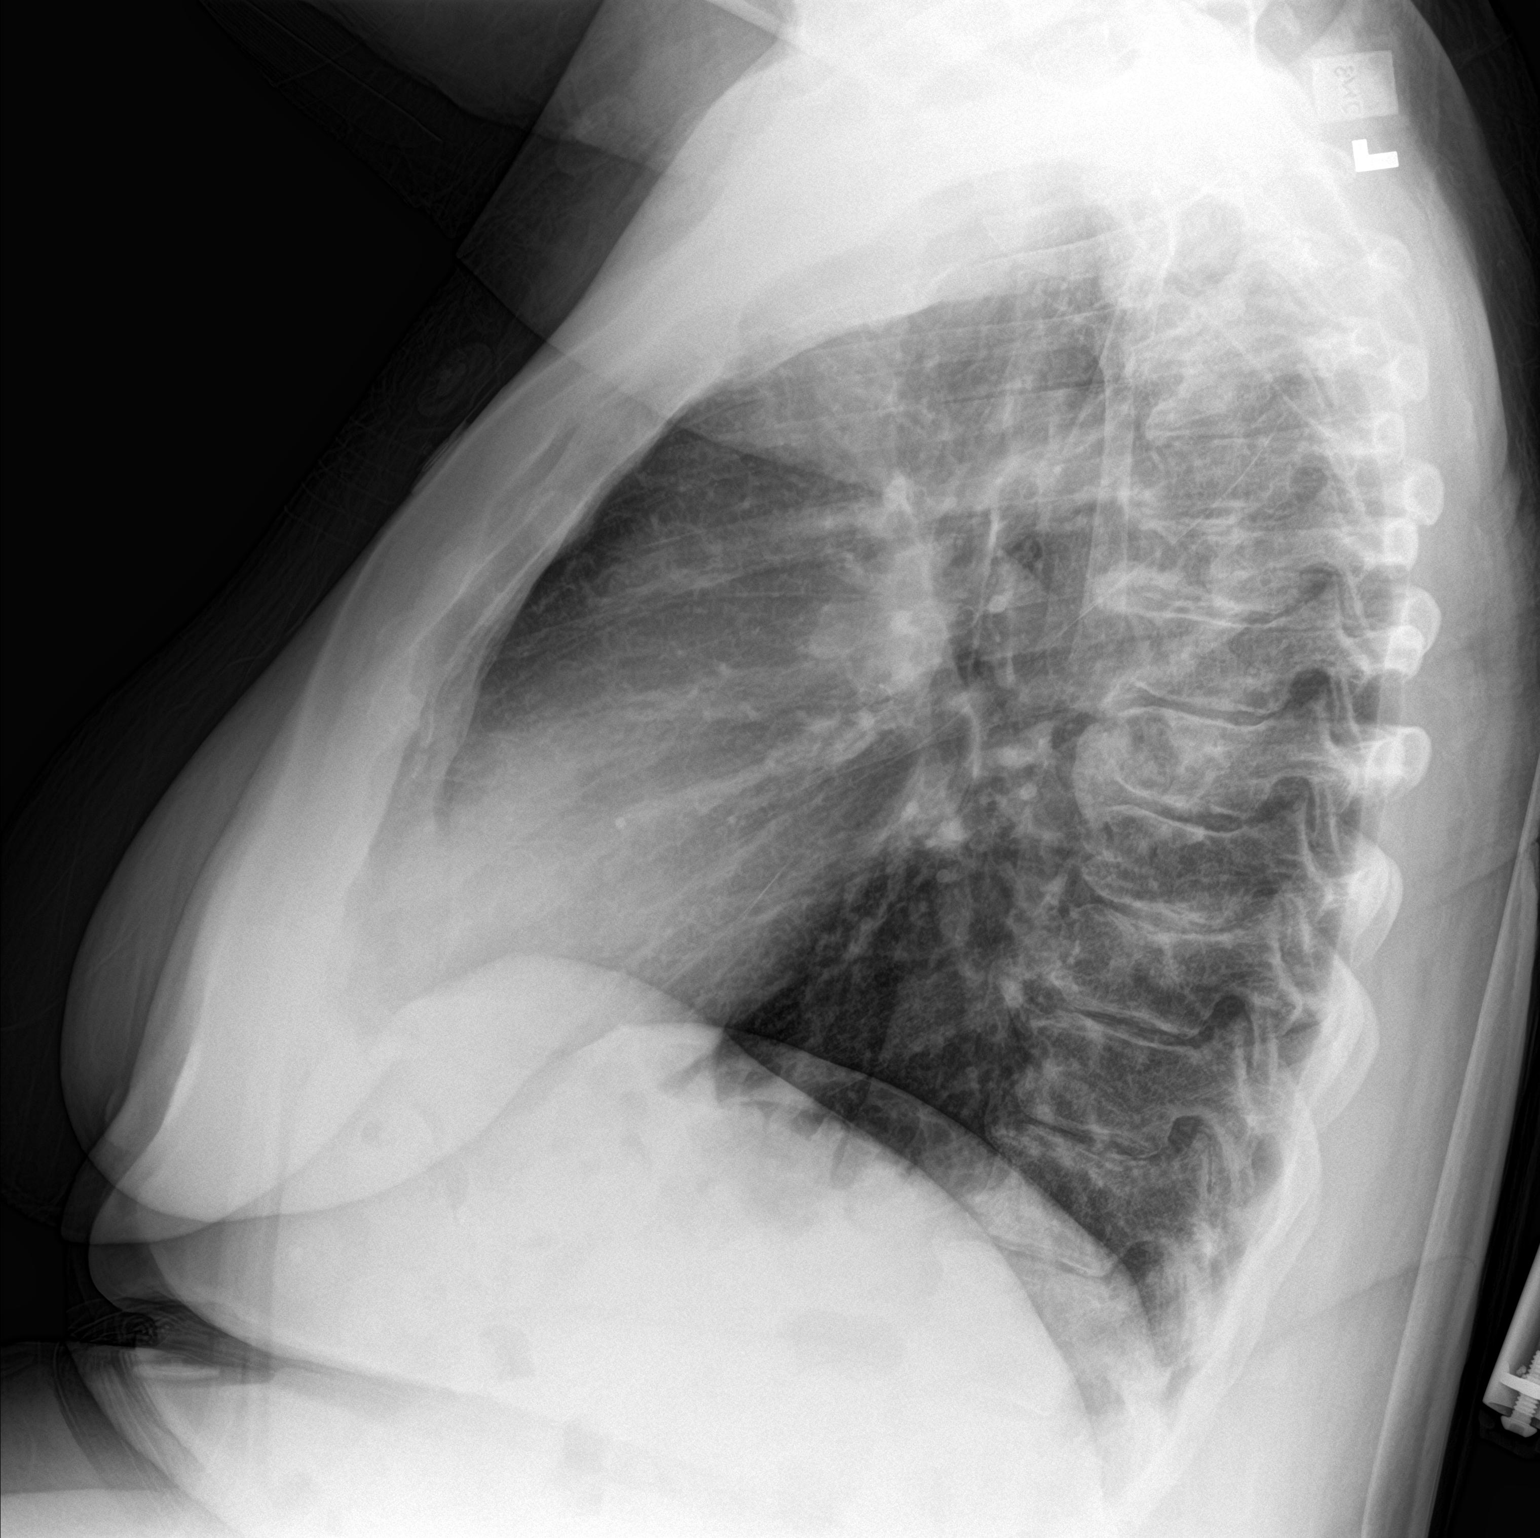

[chest ap]
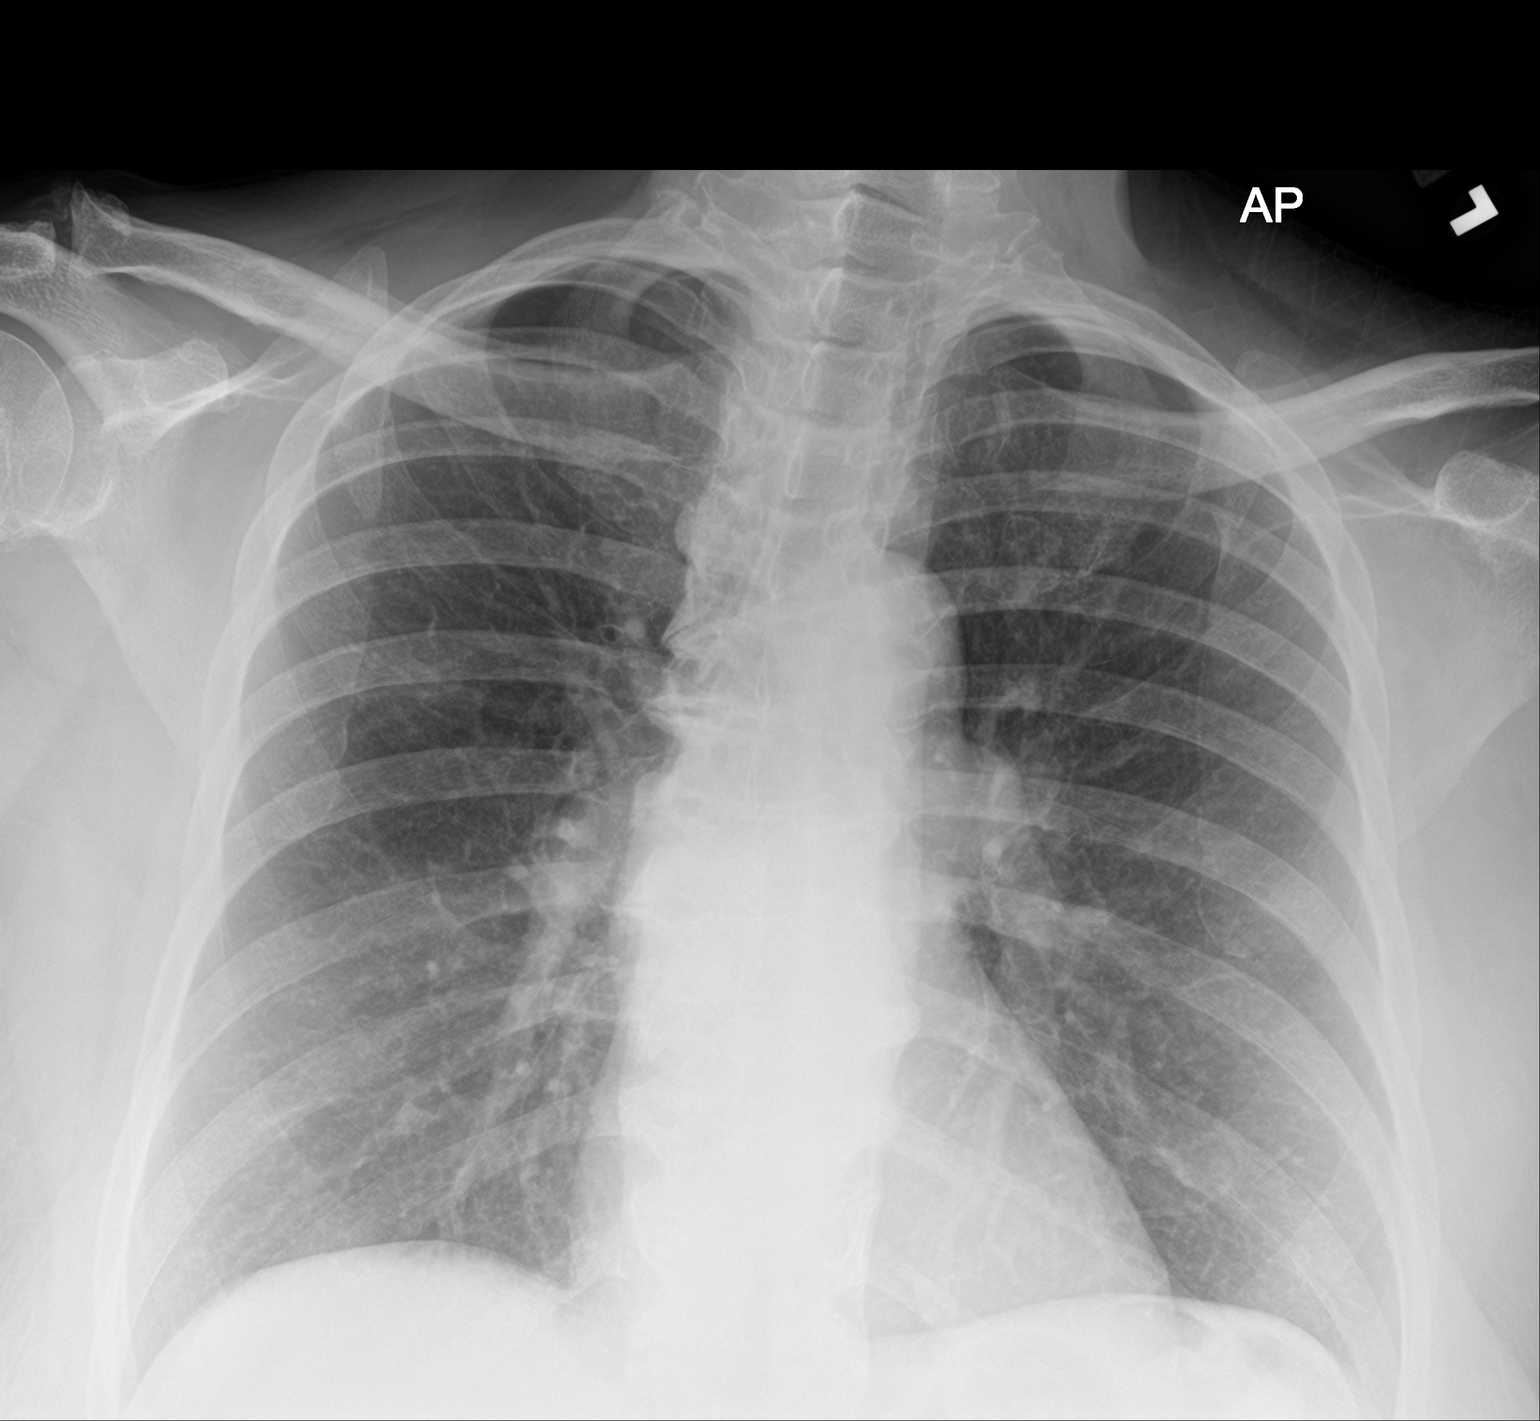

[2 of 2 positions shown; findings below may reference images not displayed]

FINDINGS: The heart size and mediastinal contours are within normal limits.
Both lungs are clear. The visualized skeletal structures are
unremarkable.
IMPRESSION: No active cardiopulmonary disease.

## 2017-10-16 IMAGING — CT CT ABD-PELV W/O CM
2 of 4 series · 16 of 46 positions shown, 18 images · non-contrast
Comparison: CT of the abdomen and pelvis on [DATE]

CLINICAL DATA: Nausea, vomiting, and diarrhea for the last month.
Denies pain

EXAM:
CT ABDOMEN AND PELVIS WITHOUT CONTRAST
TECHNIQUE: Multidetector CT imaging of the abdomen and pelvis was performed
following the standard protocol without IV contrast.

[Series 3: ap without · axial · non-contrast · 0.88mm/px · z∈[-822,-452]mm · 13 of 84 slices shown, 15 images]
[im 5/84  soft-tissue]
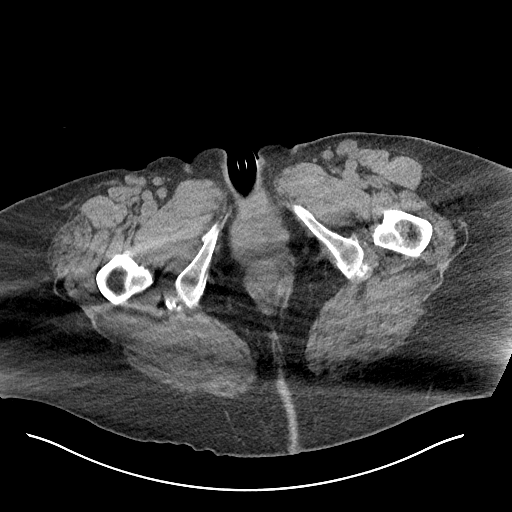
[im 5/84  bone]
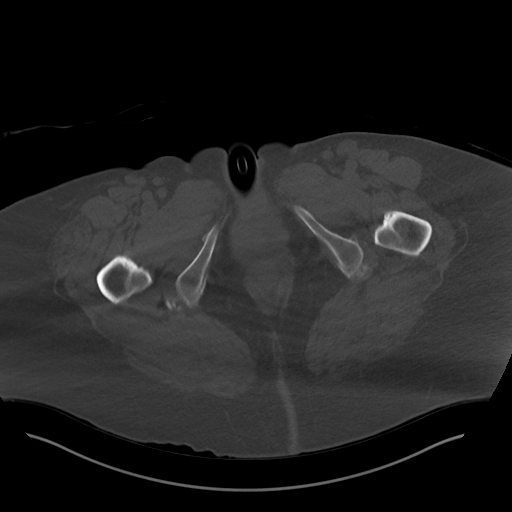
[im 10/84  soft-tissue]
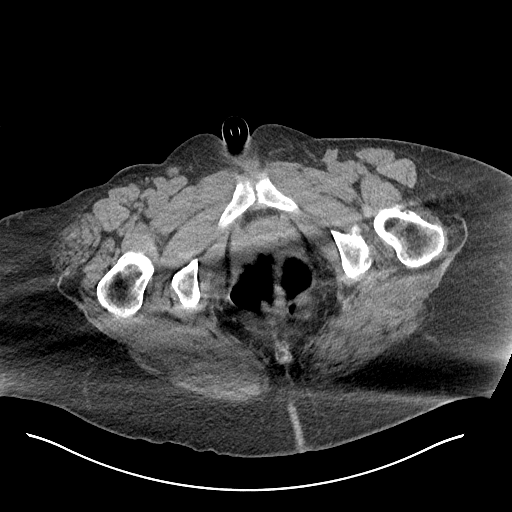
[im 19/84  soft-tissue]
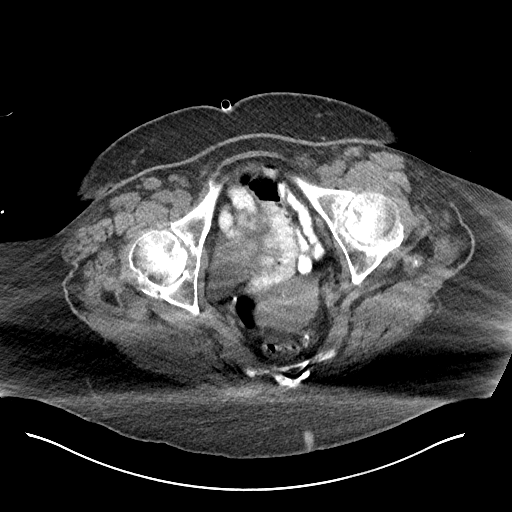
[im 24/84  soft-tissue]
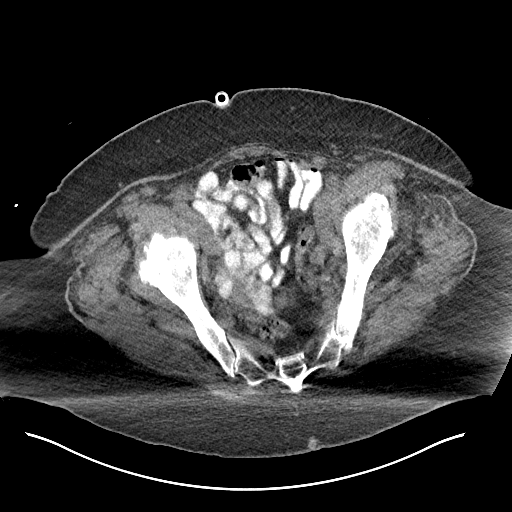
[im 28/84  soft-tissue]
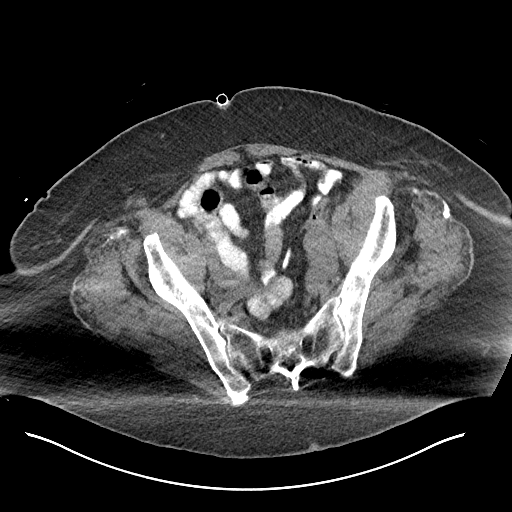
[im 37/84  soft-tissue]
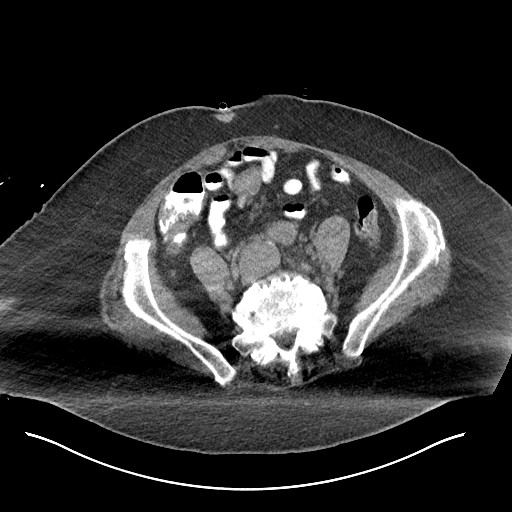
[im 42/84  soft-tissue]
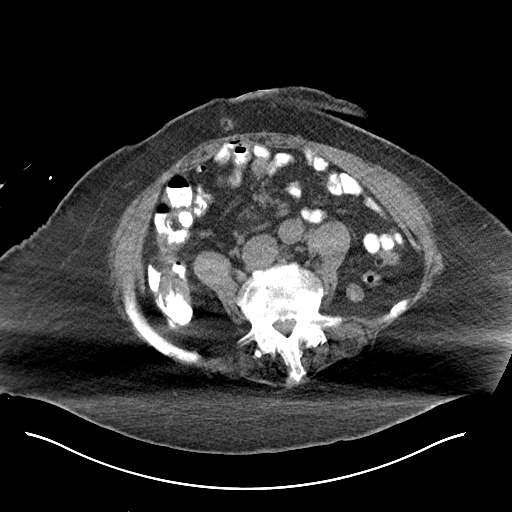
[im 47/84  soft-tissue]
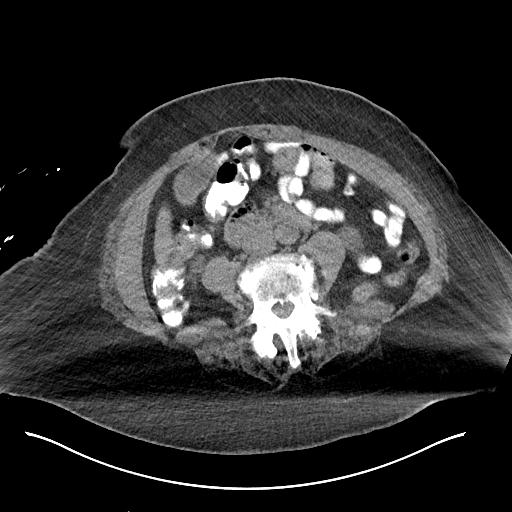
[im 56/84  soft-tissue]
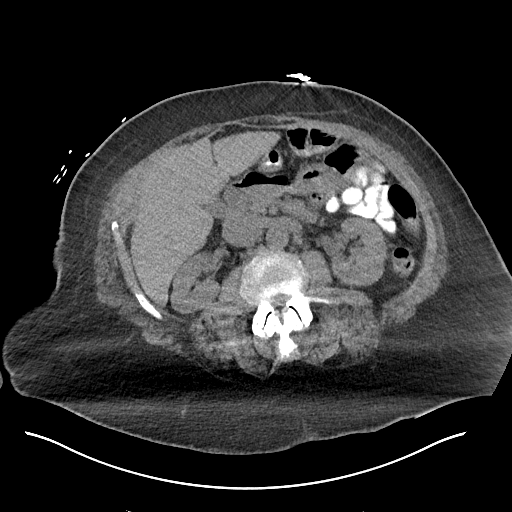
[im 56/84  bone]
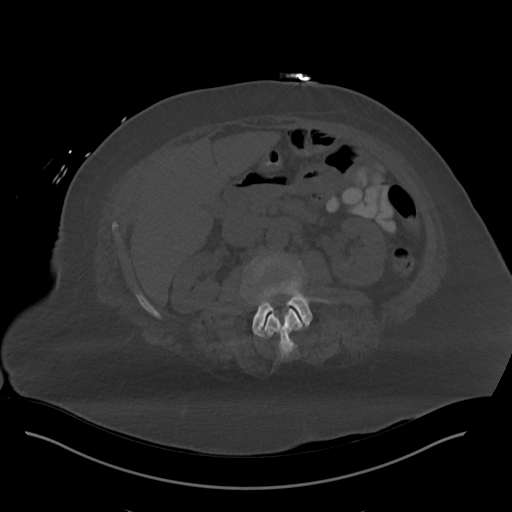
[im 60/84  soft-tissue]
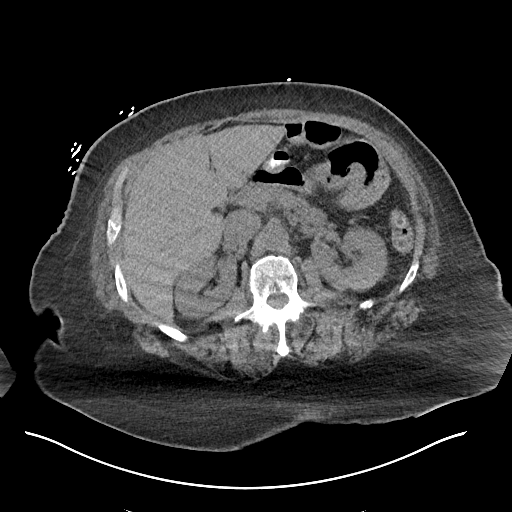
[im 65/84  soft-tissue]
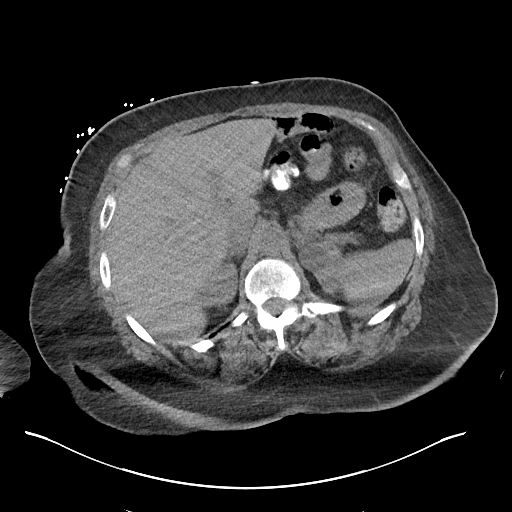
[im 74/84  soft-tissue]
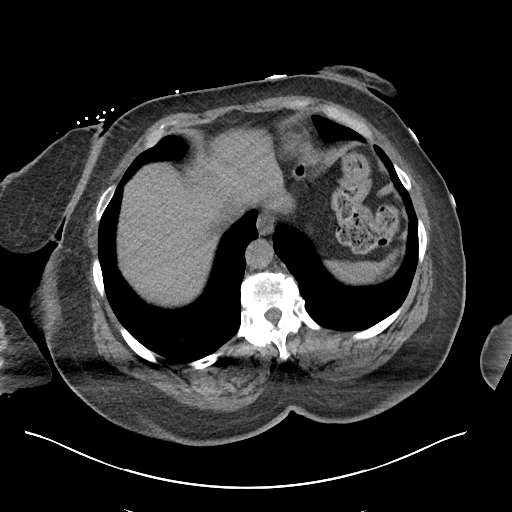
[im 79/84  soft-tissue]
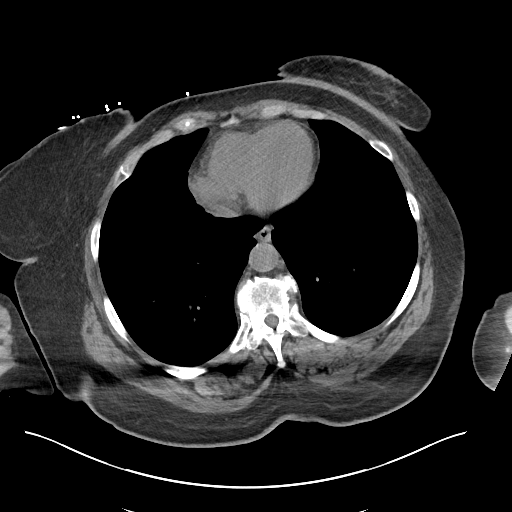

[Series 6: cor · coronal · 0.83mm/px · 3 of 91 slices shown]
[im 31/91  soft-tissue]
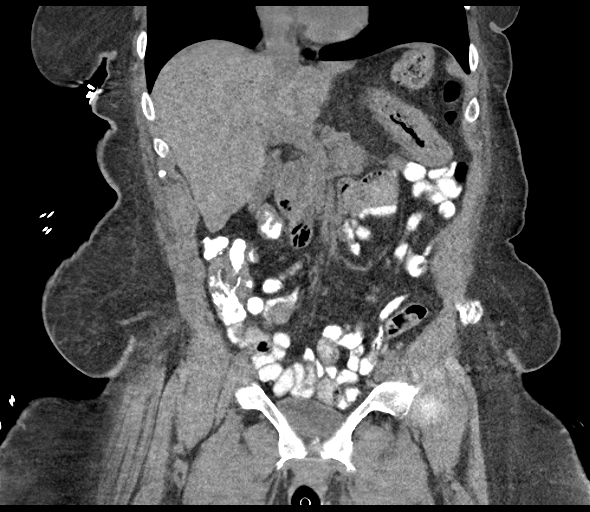
[im 41/91  soft-tissue]
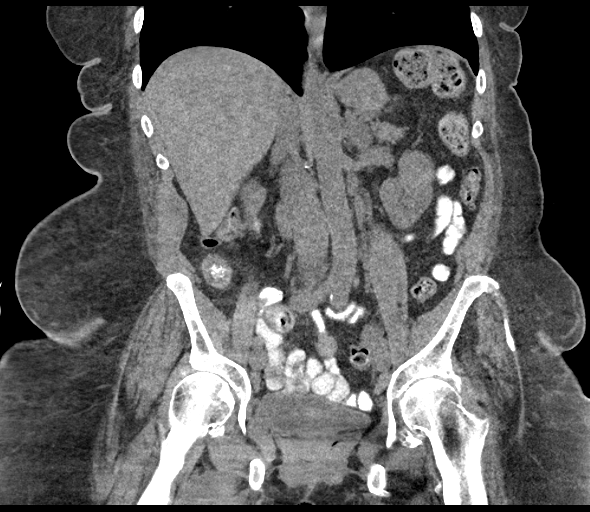
[im 51/91  soft-tissue]
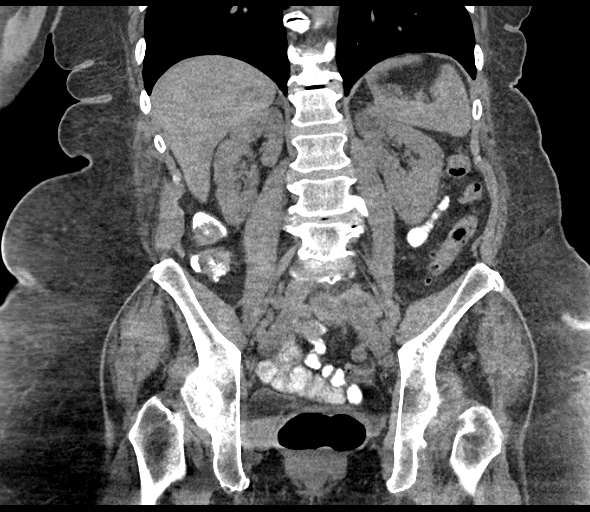

[16 of 46 positions shown; findings below may reference images not displayed]

FINDINGS: Lower chest: The lung bases are unremarkable.  Heart size is normal.

Hepatobiliary: No focal liver abnormality is seen. No radiopaque
gallstones, biliary dilatation, or pericholecystic inflammatory
changes.

Pancreas: Unremarkable. No pancreatic ductal dilatation or
surrounding inflammatory changes.

Spleen: Normal in size without focal abnormality.

Adrenals/Urinary Tract: Adrenal glands are normal in appearance. A
low-attenuation lesion in the UPPER pole region of the LEFT kidney
measures 2.6 centimeters and appears stable. There is no
hydronephrosis. No intrarenal calculi. Urinary bladder is
unremarkable.

Stomach/Bowel: The stomach and small bowel loops are normal in
appearance. Numerous colonic diverticula without associated
diverticulitis. A small fat containing lesion is identified adjacent
to the LOWER sigmoid colon, similar in appearance to a lesion seen
in the central abdomen on previous study. This may represent a focal
area of fat necrosis or other benign lesion.

Vascular/Lymphatic: No significant vascular findings are present. No
enlarged abdominal or pelvic lymph nodes.

Reproductive: The uterus is present. No adnexal mass. Significant
streak artifact in the pelvis.

Other: No free pelvic fluid.  Anterior abdominal wall

Musculoskeletal: Degenerative changes are seen in the LOWER thoracic
and lumbar spine.
IMPRESSION: 1. No bowel obstruction.
2. Colonic diverticulosis without acute diverticulitis.
3. Stable appearance of LEFT renal cyst.
4. Small benign fatty mass adjacent to the rectum, likely
representing an area of fat necrosis or appendagitis.

## 2017-10-16 MED ORDER — METHOCARBAMOL 500 MG PO TABS: 500.0000 mg | ORAL_TABLET | Freq: Four times a day (QID) | ORAL | Status: DC | PRN

## 2017-10-16 MED ORDER — SODIUM CHLORIDE 0.9 % IV BOLUS
1000.0000 mL | Freq: Once | INTRAVENOUS | Status: DC
Start: 2017-10-16 — End: 2017-10-16

## 2017-10-16 MED ORDER — TRAZODONE HCL 50 MG PO TABS
50.0000 mg | ORAL_TABLET | Freq: Once | ORAL | Status: AC
  Administered 2017-10-16: 50 mg via ORAL
  Filled 2017-10-16: qty 1

## 2017-10-16 MED ORDER — ONDANSETRON HCL 4 MG/2ML IJ SOLN
4.0000 mg | Freq: Once | INTRAMUSCULAR | Status: AC
  Administered 2017-10-16: 4 mg via INTRAVENOUS
  Filled 2017-10-16: qty 2

## 2017-10-16 MED ORDER — LACTATED RINGERS IV BOLUS
1000.0000 mL | Freq: Once | INTRAVENOUS | Status: AC
  Administered 2017-10-16: 1000 mL via INTRAVENOUS

## 2017-10-16 MED ORDER — PANTOPRAZOLE SODIUM 20 MG PO TBEC
20.0000 mg | DELAYED_RELEASE_TABLET | Freq: Every day | ORAL | Status: DC
  Administered 2017-10-16 – 2017-10-18 (×3): 20 mg via ORAL
  Filled 2017-10-16 (×3): qty 1

## 2017-10-16 MED ORDER — ONDANSETRON HCL 4 MG/2ML IJ SOLN: 4.0000 mg | Freq: Four times a day (QID) | INTRAMUSCULAR | Status: DC | PRN

## 2017-10-16 MED ORDER — PROMETHAZINE HCL 25 MG PO TABS: 25.0000 mg | ORAL_TABLET | Freq: Three times a day (TID) | ORAL | Status: DC | PRN

## 2017-10-16 MED ORDER — DIPHENHYDRAMINE-ZINC ACETATE 2-0.1 % EX CREA
TOPICAL_CREAM | Freq: Three times a day (TID) | CUTANEOUS | Status: DC | PRN
  Administered 2017-10-16: 23:00:00 via TOPICAL
  Filled 2017-10-16: qty 28

## 2017-10-16 MED ORDER — METOPROLOL TARTRATE 12.5 MG HALF TABLET
12.5000 mg | ORAL_TABLET | Freq: Two times a day (BID) | ORAL | Status: DC
  Administered 2017-10-18: 12.5 mg via ORAL
  Filled 2017-10-16 (×4): qty 1

## 2017-10-16 MED ORDER — IOPAMIDOL (ISOVUE-300) INJECTION 61%
INTRAVENOUS | Status: AC
Start: 2017-10-16 — End: 2017-10-17
  Filled 2017-10-16: qty 30

## 2017-10-16 MED ORDER — HEPARIN SODIUM (PORCINE) 5000 UNIT/ML IJ SOLN
5000.0000 [IU] | Freq: Three times a day (TID) | INTRAMUSCULAR | Status: DC
  Administered 2017-10-16 – 2017-10-18 (×6): 5000 [IU] via SUBCUTANEOUS
  Filled 2017-10-16 (×4): qty 1

## 2017-10-16 MED ORDER — SODIUM CHLORIDE 0.9 % IV SOLN
INTRAVENOUS | Status: AC
  Administered 2017-10-16 – 2017-10-17 (×3): via INTRAVENOUS

## 2017-10-16 MED ORDER — ACETAMINOPHEN 325 MG PO TABS
650.0000 mg | ORAL_TABLET | Freq: Four times a day (QID) | ORAL | Status: DC | PRN
  Administered 2017-10-17 (×2): 650 mg via ORAL
  Filled 2017-10-16 (×2): qty 2

## 2017-10-16 NOTE — ED Triage Notes (Signed)
Pt presents for evaluation of generalized weakness and dehydration from PCP office. Pt reports was having N/V/D but has been taking phenergan and that has resolved. Denies rectal bleeding.

## 2017-10-16 NOTE — ED Provider Notes (Signed)
[facility] EMERGENCY DEPARTMENT Provider Note   CSN: [id number] Arrival date & time: 10/16/17  1030     History   Chief Complaint Chief Complaint  Patient presents with  . Weakness    HPI Oveda Pellicane is a 72 y.o. female.  HPI 72 year old female with extensive past medical history as below here with generalized weakness.  Over the last month, the patient has reportedly had progressively worsening nausea, vomiting, and diarrhea.  She has been worked up by her PCP.  She has been given Phenergan.  Lab work at her PCP is showing progressively worsening kidney injury, and patient states she is been essentially unable to eat or drink.  She has had associated weight loss.  She has not had any imaging or referral to gastroenterology. She has had intermittent aching, gnawing abdominal pain. No alleviating factors. No urinary sx.  Past Medical History:  Diagnosis Date  . Acute kidney injury ([place])   . Aortic stenosis, mild 11/17/2013  . Arthritis    "both knees, spine, back, fingers" (11/29/2013)  . CHF (congestive heart failure) ([place])   . Edema   . GERD (gastroesophageal reflux disease)   . Heart murmur   . History of diastolic dysfunction    Echo 08/537 with diastolic dysfunction  . HTN (hypertension)   . Morbid obesity ([place])   . OA (osteoarthritis)   . PAC (premature atrial contraction)    per prior Holter  . Palpitations   . Pneumonia    "as a child"  . PVC's (premature ventricular contractions)    per prior Holter  . SVT (supraventricular tachycardia) ([place])   . Tachycardia    noted at 07/28/11 visit. Started on beta blocker. Possible atrial flutter vs long PR tachycardia/AVNRT  . Type II diabetes mellitus [facility])     Patient Active Problem List   Diagnosis Date Noted  . Diarrhea 10/11/2017  . Routine general medical examination at a health care facility 05/10/2014  . Paroxysmal SVT (supraventricular tachycardia) ([place]) 11/28/2013  . Physical  deconditioning 11/23/2013  . Nausea with vomiting 11/17/2013  . Multilevel spine pain 11/17/2013  . Aortic stenosis, mild 11/17/2013  . DM type 2 (diabetes mellitus, type 2) ([place]) 02/02/2013  . Arthritis 10/19/2012  . Mixed incontinence 10/19/2012  . GERD (gastroesophageal reflux disease) 04/20/2011  . Venous stasis of lower extremity 01/20/2011  . HTN (hypertension) 01/20/2011  . Morbid obesity ([place])     Past Surgical History:  Procedure Laterality Date  . CESAREAN SECTION  1985  . CESAREAN SECTION  1985  . SUPRAVENTRICULAR TACHYCARDIA ABLATION  11/29/2013  . SUPRAVENTRICULAR TACHYCARDIA ABLATION N/A 11/29/2013   Procedure: SUPRAVENTRICULAR TACHYCARDIA ABLATION;  Surgeon: Evans Lance, MD;  Location: [facility] CATH LAB;  Service: Cardiovascular;  Laterality: N/A;  . US ECHOCARDIOGRAPHY  07/25/2008   EF 55-60%  . VAGINAL DELIVERY       OB History   None      Home Medications    Prior to Admission medications   Medication Sig Start Date End Date Taking? Authorizing Provider  acetaminophen (TYLENOL) 325 MG tablet Take 2 tablets (650 mg total) by mouth every 6 (six) hours as needed for mild pain (or Fever >/= 101). 11/22/13  Yes Delfina Redwood, MD  aspirin EC 81 MG tablet Take 81 mg by mouth daily with breakfast. Takes once or twice weekly   Yes [provider]  losartan (COZAAR) 100 MG tablet Take 1 tablet (100 mg total) by mouth daily. 10/11/17  Yes Hoyt Koch, MD  metFORMIN (GLUCOPHAGE) 500 MG tablet TAKE 1 TABLET BY MOUTH DAILY WITH BREAKFAST. 06/16/17  Yes Hoyt Koch, MD  methocarbamol (ROBAXIN) 500 MG tablet Take 1 tablet (500 mg total) by mouth every 6 (six) hours as needed for muscle spasms. 04/12/17  Yes Hoyt Koch, MD  metoprolol tartrate (LOPRESSOR) 50 MG tablet Take 1 tablet (50 mg total) by mouth 2 (two) times daily. 10/11/17  Yes Hoyt Koch, MD  Neomycin-Bacitracin-Polymyxin ([facility] ANTIBIOTIC) [phone] [place] LGS Patient not taking: Reported on 10/16/2017 11/23/16   Hoyt Koch, MD  ondansetron (ZOFRAN) 4 MG tablet Take 1 tablet (4 mg total) by mouth every 8 (eight) hours as needed for nausea or vomiting. Patient not taking: Reported on 10/15/2017 10/11/17   Hoyt Koch, MD  promethazine (PHENERGAN) 25 MG tablet Take 1 tablet (25 mg total) by mouth every 8 (eight) hours as needed for nausea or vomiting. 10/15/17   Hoyt Koch, MD    Family History Family History  Problem Relation Age of Onset  . Alzheimer's disease Mother   . Lung cancer Mother   . COPD Father   . Diabetes Maternal Uncle   . Stroke Neg Hx   . Colon cancer Neg Hx     Social History Social History   Tobacco Use  . Smoking status: Former Smoker    Packs/day: 2.00    Years: 15.00    Pack years: 30.00    Types: Cigarettes    Last attempt to quit: 11/23/1985    Years since quitting: 31.9  . Smokeless tobacco: Never Used  Substance Use Topics  . Alcohol use: No  . Drug use: No     Allergies   Oxycodone hcl and Penicillins   Review of Systems Review of Systems  Constitutional: Positive for fatigue. Negative for chills and fever.  HENT: Negative for congestion and rhinorrhea.   Eyes: Negative for visual disturbance.  Respiratory: Negative for cough, shortness of breath and wheezing.   Cardiovascular: Negative for chest pain and leg swelling.  Gastrointestinal: Negative for abdominal pain, diarrhea, nausea and vomiting.  Genitourinary: Negative for dysuria and flank pain.  Musculoskeletal: Negative for neck pain and neck stiffness.  Skin: Negative for rash and wound.  Allergic/Immunologic: Negative for immunocompromised state.  Neurological: Negative for syncope, weakness and headaches.  All other systems reviewed and are negative.    Physical Exam Updated Vital Signs BP 107/73 (BP Location: Left Arm)   Pulse 98   Temp 98.1 F (36.7 C) (Oral)   Resp 16    Ht 5\' 11"  (1.803 m)   Wt 122.5 kg (270 lb)   SpO2 100%   BMI 37.66 kg/m   Physical Exam  Constitutional: She is oriented to person, place, and time. She appears well-developed and well-nourished. No distress.  HENT:  Head: Normocephalic and atraumatic.  Dry MM. R sided serous OM, no erythema or warmth. Mastoids non-tender, without erythema.  Eyes: Conjunctivae are normal.  Neck: Neck supple.  Cardiovascular: Normal rate, regular rhythm and normal heart sounds. Exam reveals no friction rub.  No murmur heard. Pulmonary/Chest: Effort normal and breath sounds normal. No respiratory distress. She has no wheezes. She has no rales.  Abdominal: Soft. She exhibits no distension. There is tenderness (generalized).  Musculoskeletal: She exhibits no edema.  Neurological: She is alert and oriented to person, place, and time. She exhibits normal muscle tone.  Skin: Skin is warm. Capillary  refill takes less than 2 seconds.  Psychiatric: She has a normal mood and affect.  Nursing note and vitals reviewed.    ED Treatments / Results  Labs (all labs ordered are listed, but only abnormal results are displayed) Labs Reviewed  COMPREHENSIVE METABOLIC PANEL - Abnormal; Notable for the following components:      Result Value   Sodium 134 (*)    CO2 18 (*)    Glucose, Bld 194 (*)    BUN 58 (*)    Creatinine, Ser 2.01 (*)    Total Protein 8.6 (*)    Albumin 2.9 (*)    GFR calc non Af Amer 24 (*)    GFR calc Af Amer 28 (*)    All other components within normal limits  CBC WITH DIFFERENTIAL/PLATELET - Abnormal; Notable for the following components:   Hemoglobin 9.9 (*)    HCT 32.6 (*)    MCV 76.0 (*)    MCH 23.1 (*)    RDW 18.2 (*)    Platelets 469 (*)    All other components within normal limits  I-STAT CG4 LACTIC ACID, ED - Abnormal; Notable for the following components:   Lactic Acid, Venous 1.97 (*)    All other components within normal limits  GASTROINTESTINAL PANEL BY PCR, STOOL  (REPLACES STOOL CULTURE)  C DIFFICILE QUICK SCREEN W PCR REFLEX  MAGNESIUM  URINALYSIS, ROUTINE W REFLEX MICROSCOPIC  I-STAT CG4 LACTIC ACID, ED    EKG None  Radiology Dg Chest 2 View  Result Date: 10/16/2017 CLINICAL DATA:  Generalized weakness.  Dehydration. EXAM: CHEST - 2 VIEW COMPARISON:  November 17, 2013 FINDINGS: The heart size and mediastinal contours are within normal limits. Both lungs are clear. The visualized skeletal structures are unremarkable. IMPRESSION: No active cardiopulmonary disease. Electronically Signed   By: Dorise Bullion III M.D   On: 10/16/2017 11:32    Procedures Procedures (including critical care time)  Medications Ordered in ED Medications  iopamidol (ISOVUE-300) 61 % injection (has no administration in time range)  lactated ringers bolus 1,000 mL (1,000 mLs Intravenous New Bag/Given 10/16/17 1449)  ondansetron (ZOFRAN) injection 4 mg (4 mg Intravenous Given 10/16/17 1333)  lactated ringers bolus 1,000 mL (0 mLs Intravenous Stopped 10/16/17 1447)     Initial Impression / Assessment and Plan / ED Course  I have reviewed the triage vital signs and the nursing notes.  Pertinent labs & imaging results that were available during my care of the patient were reviewed by me and considered in my medical decision making (see chart for details).     72 yo AAF here with nausea, vomiting, diarrhea x 1 month, with worsening AKI on PCP labs. Pt with diffuse TTP on my exam. Labs are c/w AKI, likely pre-renal. Will check CT, stool studies, plan for admission for IV hydration. Of note, pt also with some reported dizziness, ear pressure - will check CT to make sure her nausea/vomiting is not from increased ICP/intracranial lesion, though remainder of neuro exam is reassuring here.  Patient care transferred to Dr. Lita Mains at the end of my shift. Patient presentation, ED course, and plan of care discussed with review of all pertinent labs and imaging. Please see  his/her note for further details regarding further ED course and disposition.   Final Clinical Impressions(s) / ED Diagnoses   Final diagnoses:  Dehydration  Generalized abdominal pain    ED Discharge Orders    None       Duffy Bruce, MD  10/16/17 1851  

## 2017-10-16 NOTE — ED Notes (Signed)
Pt presents for IV fluids due to abnormal lab values drawn yesterday.  Labs reviewed, triage assessment per NP.  Pt instructed to go to ED.

## 2017-10-16 NOTE — ED Provider Notes (Signed)
[facility]    CSN: [id number] Arrival date & time: 10/16/17  1000     History   Chief Complaint No chief complaint on file.   HPI [name] [name] is a 72 y.o. female.   72 y.o. female presents with "not feeling good" X 1 month including nausea vomiting. Per note from Dr. [name] dated 7.12.19 Unclear etiology and severe at this time. She is down weight and some AKI on last labs. Zofran did not help with nausea. Given phenergan today in the office. Rechecking lipase, CBC, CMP for any changes.  If persistent or worsening AKI needs to go to ER for fluids. CT abdomen ordered stat today given persistence of symptoms and weight loss due to lack of being able to eat. Rx for phenergan pills as well for nause. Comparison of labs from 7.8 to 7.12 shows worsening renal function. This was discussed with patient who is in agreement with plan. Patient sent to ED for further evaluation and possible intervention     Past Medical History:  Diagnosis Date  . Acute kidney injury ([place])   . Aortic stenosis, mild 11/17/2013  . Arthritis    "both knees, spine, back, fingers" (11/29/2013)  . CHF (congestive heart failure) ([place])   . Edema   . GERD (gastroesophageal reflux disease)   . Heart murmur   . History of diastolic dysfunction    Echo 05/7780 with diastolic dysfunction  . HTN (hypertension)   . Morbid obesity ([place])   . OA (osteoarthritis)   . PAC (premature atrial contraction)    per prior Holter  . Palpitations   . Pneumonia    "as a child"  . PVC's (premature ventricular contractions)    per prior Holter  . SVT (supraventricular tachycardia) ([place])   . Tachycardia    noted at 07/28/11 visit. Started on beta blocker. Possible atrial flutter vs long PR tachycardia/AVNRT  . Type II diabetes mellitus [facility])     Patient Active Problem List   Diagnosis Date Noted  . Diarrhea 10/11/2017  . Routine general medical examination at a health care facility 05/10/2014  . Paroxysmal  SVT (supraventricular tachycardia) ([place]) 11/28/2013  . Physical deconditioning 11/23/2013  . Nausea with vomiting 11/17/2013  . Multilevel spine pain 11/17/2013  . Aortic stenosis, mild 11/17/2013  . DM type 2 (diabetes mellitus, type 2) ([place]) 02/02/2013  . Arthritis 10/19/2012  . Mixed incontinence 10/19/2012  . GERD (gastroesophageal reflux disease) 04/20/2011  . Venous stasis of lower extremity 01/20/2011  . HTN (hypertension) 01/20/2011  . Morbid obesity ([place])     Past Surgical History:  Procedure Laterality Date  . CESAREAN SECTION  1985  . CESAREAN SECTION  1985  . SUPRAVENTRICULAR TACHYCARDIA ABLATION  11/29/2013  . SUPRAVENTRICULAR TACHYCARDIA ABLATION N/A 11/29/2013   Procedure: SUPRAVENTRICULAR TACHYCARDIA ABLATION;  Surgeon: Evans Lance, MD;  Location: [facility] CATH LAB;  Service: Cardiovascular;  Laterality: N/A;  . US ECHOCARDIOGRAPHY  07/25/2008   EF 55-60%  . VAGINAL DELIVERY      OB History   None      Home Medications    Prior to Admission medications   Medication Sig Start Date End Date Taking? Authorizing Provider  acetaminophen (TYLENOL) 325 MG tablet Take 2 tablets (650 mg total) by mouth every 6 (six) hours as needed for mild pain (or Fever >/= 101). 11/22/13   Delfina Redwood, MD  aspirin EC 81 MG tablet Take 81 mg by mouth daily with breakfast. Takes once or twice weekly  [provider]  losartan (COZAAR) 100 MG tablet Take 1 tablet (100 mg total) by mouth daily. 10/11/17   Hoyt Koch, MD  metFORMIN (GLUCOPHAGE) 500 MG tablet TAKE 1 TABLET BY MOUTH DAILY WITH BREAKFAST. 06/16/17   Hoyt Koch, MD  methocarbamol (ROBAXIN) 500 MG tablet Take 1 tablet (500 mg total) by mouth every 6 (six) hours as needed for muscle spasms. 04/12/17   Hoyt Koch, MD  metoprolol tartrate (LOPRESSOR) 50 MG tablet Take 1 tablet (50 mg total) by mouth 2 (two) times daily. 10/11/17   Hoyt Koch, MD  Neomycin-Bacitracin-Polymyxin  ([facility] ANTIBIOTIC) [phone] OINT APPLY AFTER CLEANING BEFORE WRAPPING LGS 11/23/16   Hoyt Koch, MD  ondansetron (ZOFRAN) 4 MG tablet Take 1 tablet (4 mg total) by mouth every 8 (eight) hours as needed for nausea or vomiting. Patient not taking: Reported on 10/15/2017 10/11/17   Hoyt Koch, MD  promethazine (PHENERGAN) 25 MG tablet Take 1 tablet (25 mg total) by mouth every 8 (eight) hours as needed for nausea or vomiting. 10/15/17   Hoyt Koch, MD    Family History Family History  Problem Relation Age of Onset  . Alzheimer's disease Mother   . Lung cancer Mother   . COPD Father   . Diabetes Maternal Uncle   . Stroke Neg Hx   . Colon cancer Neg Hx     Social History Social History   Tobacco Use  . Smoking status: Former Smoker    Packs/day: 2.00    Years: 15.00    Pack years: 30.00    Types: Cigarettes    Last attempt to quit: 11/23/1985    Years since quitting: 31.9  . Smokeless tobacco: Never Used  Substance Use Topics  . Alcohol use: No  . Drug use: No     Allergies   Oxycodone hcl and Penicillins   Review of Systems Review of Systems  Constitutional: Positive for activity change and fatigue.  Gastrointestinal: Positive for nausea and vomiting.     Physical Exam Triage Vital Signs ED Triage Vitals  Enc Vitals Group     BP      Pulse      Resp      Temp      Temp src      SpO2      Weight      Height      Head Circumference      Peak Flow      Pain Score      Pain Loc      Pain Edu?      Excl. in [place]?    No data found.  Updated Vital Signs There were no vitals taken for this visit.  Visual Acuity Right Eye Distance:   Left Eye Distance:   Bilateral Distance:    Right Eye Near:   Left Eye Near:    Bilateral Near:     Physical Exam  Constitutional: She is oriented to person, place, and time.  Fatigued.   HENT:  Head: Normocephalic and atraumatic.  Eyes: Conjunctivae are normal.  Neck: Normal range of  motion.  Pulmonary/Chest: Effort normal.  Neurological: She is alert and oriented to person, place, and time.  Psychiatric: She has a normal mood and affect.  Nursing note and vitals reviewed.    UC Treatments / Results  Labs (all labs ordered are listed, but only abnormal results are displayed) Labs Reviewed - No data to display  EKG  None  Radiology No results found.  Procedures Procedures (including critical care time)  Medications Ordered in UC Medications - No data to display  Initial Impression / Assessment and Plan / UC Course  I have reviewed the triage vital signs and the nursing notes.  Pertinent labs & imaging results that were available during my care of the patient were reviewed by me and considered in my medical decision making (see chart for details).      Final Clinical Impressions(s) / UC Diagnoses   Final diagnoses:  None   Discharge Instructions   None    ED Prescriptions    None     Controlled Substance Prescriptions  Controlled Substance Registry consulted? Not Applicable   Jacqualine Mau, NP 10/16/17 1026

## 2017-10-16 NOTE — H&P (Addendum)
History and Physical  [name] [name] OTL:[id number] DOB: Jun 27, 1945 DOA: 10/16/2017  Referring physician: EDP PCP: Hoyt Koch, MD   Chief Complaint: n/v/d,  weight loss, weakness  HPI: [name] [name] is a 72 y.o. female   H/o SVT s/p ablation in 2015, h/o NIDDM2, HTN,, she is sent from primary care doctor's office to [facility], ED due to above complaints.  Per primary care physician "severe problem of nausea and vomiting. She cannot keep anything down much. She is drinking water and some popsicles which can stay down. She is not eating any solids. No BM for the last 2-3 days. Previous to this she was having liquid stools. She denies abdominal pain or swelling", she has about 10 pounds weight loss completed feeling dizzy and weak.  She has not been taking her blood pressure medication and diabetes medication since she is sick in the past few weeks.  ED course: Vital signs are stable, CBC showed baseline anemia, hemoglobin 9.9, no leukocytosis.  BMP showed AKI, BUN 58,  creatinine 2 (base line creatinine was normal in January 2019), LFT unremarkable.  Lactic acid elevated at 1.97.  UA with many bacteria.  Chest x-ray and CT abdomen no acute findings.  She received Zofran x1 and 2 L of IV fluids bolus in the ED, hospital medicine called to further manage the patient.    Review of Systems:  Detail per HPI, Review of systems are otherwise negative  Past Medical History:  Diagnosis Date  . Acute kidney injury ([place])   . Aortic stenosis, mild 11/17/2013  . Arthritis    "both knees, spine, back, fingers" (11/29/2013)  . CHF (congestive heart failure) ([place])   . Edema   . GERD (gastroesophageal reflux disease)   . Heart murmur   . History of diastolic dysfunction    Echo 12/7414 with diastolic dysfunction  . HTN (hypertension)   . Morbid obesity ([place])   . OA (osteoarthritis)   . PAC (premature atrial contraction)    per prior Holter  . Palpitations   . Pneumonia    "as a child"   . PVC's (premature ventricular contractions)    per prior Holter  . SVT (supraventricular tachycardia) ([place])   . Tachycardia    noted at 07/28/11 visit. Started on beta blocker. Possible atrial flutter vs long PR tachycardia/AVNRT  . Type II diabetes mellitus ([place])    Past Surgical History:  Procedure Laterality Date  . CESAREAN SECTION  1985  . CESAREAN SECTION  1985  . SUPRAVENTRICULAR TACHYCARDIA ABLATION  11/29/2013  . SUPRAVENTRICULAR TACHYCARDIA ABLATION N/A 11/29/2013   Procedure: SUPRAVENTRICULAR TACHYCARDIA ABLATION;  Surgeon: Evans Lance, MD;  Location: [facility] CATH LAB;  Service: Cardiovascular;  Laterality: N/A;  . US ECHOCARDIOGRAPHY  07/25/2008   EF 55-60%  . VAGINAL DELIVERY     Social History:  reports that she quit smoking about 31 years ago. Her smoking use included cigarettes. She has a 30.00 pack-year smoking history. She has never used smokeless tobacco. She reports that she does not drink alcohol or use drugs. Patient lives at home & is able to participate in activities of daily living independently previously  Allergies  Allergen Reactions  . Oxycodone Hcl Nausea And Vomiting  . Penicillins Itching    Has patient had a PCN reaction causing immediate rash, facial/tongue/throat swelling, SOB or lightheadedness with hypotension: No Has patient had a PCN reaction causing severe rash involving mucus membranes or skin necrosis: No Has patient had a PCN reaction that required  hospitalization: No Has patient had a PCN reaction occurring within the last 10 years: No If all of the above answers are "NO", then may proceed with Cephalosporin use.    Family History  Problem Relation Age of Onset  . Alzheimer's disease Mother   . Lung cancer Mother   . COPD Father   . Diabetes Maternal Uncle   . Stroke Neg Hx   . Colon cancer Neg Hx       Prior to Admission medications   Medication Sig Start Date End Date Taking? Authorizing Provider  acetaminophen (TYLENOL) 325 MG  tablet Take 2 tablets (650 mg total) by mouth every 6 (six) hours as needed for mild pain (or Fever >/= 101). 11/22/13  Yes Delfina Redwood, MD  aspirin EC 81 MG tablet Take 81 mg by mouth daily with breakfast. Takes once or twice weekly   Yes [provider]  losartan (COZAAR) 100 MG tablet Take 1 tablet (100 mg total) by mouth daily. 10/11/17  Yes Hoyt Koch, MD  metFORMIN (GLUCOPHAGE) 500 MG tablet TAKE 1 TABLET BY MOUTH DAILY WITH BREAKFAST. 06/16/17  Yes Hoyt Koch, MD  methocarbamol (ROBAXIN) 500 MG tablet Take 1 tablet (500 mg total) by mouth every 6 (six) hours as needed for muscle spasms. 04/12/17  Yes Hoyt Koch, MD  metoprolol tartrate (LOPRESSOR) 50 MG tablet Take 1 tablet (50 mg total) by mouth 2 (two) times daily. 10/11/17  Yes Hoyt Koch, MD  Neomycin-Bacitracin-Polymyxin ([facility] ANTIBIOTIC) [phone] [place] [facility] Patient not taking: Reported on 10/16/2017 11/23/16   Hoyt Koch, MD  ondansetron (ZOFRAN) 4 MG tablet Take 1 tablet (4 mg total) by mouth every 8 (eight) hours as needed for nausea or vomiting. Patient not taking: Reported on 10/15/2017 10/11/17   Hoyt Koch, MD  promethazine (PHENERGAN) 25 MG tablet Take 1 tablet (25 mg total) by mouth every 8 (eight) hours as needed for nausea or vomiting. 10/15/17   Hoyt Koch, MD    Physical Exam: BP (!) 149/102   Pulse (!) 101   Temp 98.1 F (36.7 C) (Oral)   Resp 14   Ht 5\' 11"  (1.803 m)   Wt 122.5 kg (270 lb)   SpO2 98%   BMI 37.66 kg/m   General:  NAD, aaox3 Eyes: PERRL ENT: unremarkable Neck: supple, no JVD Cardiovascular: RRR Respiratory: CTABL Abdomen: soft/NT/ND, positive bowel sounds Skin: chronic bilateral lower extremity lymphdema changes ( see pics below), skin pealing left lateral shin Musculoskeletal: as above Psychiatric: calm/cooperative Neurologic: no focal findings                   Labs on Admission:  Basic Metabolic Panel: Recent Labs  Lab 10/11/17 0827 10/15/17 0949 10/16/17 1105 10/16/17 1330  NA 133* 133* 134*  --   K 4.4 4.9 4.7  --   CL 103 103 104  --   CO2 19 18* 18*  --   GLUCOSE 149* 173* 194*  --   BUN 55* 61* 58*  --   CREATININE 1.48* 2.20* 2.01*  --   CALCIUM 9.5 9.5 9.2  --   MG  --   --   --  2.4   Liver Function Tests: Recent Labs  Lab 10/11/17 0827 10/15/17 0949 10/16/17 1105  AST 18 14 22   ALT 14 12 16   ALKPHOS 59 74 72  BILITOT 0.4 0.4 0.5  PROT 8.8* 9.0* 8.6*  ALBUMIN 3.5  3.4* 2.9*   Recent Labs  Lab 10/11/17 0827 10/15/17 0949  LIPASE 22.0 18.0   No results for input(s): AMMONIA in the last 168 hours. CBC: Recent Labs  Lab 10/11/17 0827 10/15/17 0949 10/16/17 1105  WBC 8.2 10.7* 7.4  NEUTROABS  --   --  5.7  HGB 9.9* 10.2* 9.9*  HCT 29.8* 31.5* 32.6*  MCV 72.4* 73.1* 76.0*  PLT 563.0* 555.0* 469*   Cardiac Enzymes: No results for input(s): CKTOTAL, CKMB, CKMBINDEX, TROPONINI in the last 168 hours.  BNP (last 3 results) No results for input(s): BNP in the last 8760 hours.  ProBNP (last 3 results) No results for input(s): PROBNP in the last 8760 hours.  CBG: No results for input(s): GLUCAP in the last 168 hours.  Radiological Exams on Admission: Ct Abdomen Pelvis Wo Contrast  Result Date: 10/16/2017 CLINICAL DATA:  Nausea, vomiting, and diarrhea for the last month. Denies pain EXAM: CT ABDOMEN AND PELVIS WITHOUT CONTRAST TECHNIQUE: Multidetector CT imaging of the abdomen and pelvis was performed following the standard protocol without IV contrast. COMPARISON:  CT of the abdomen and pelvis on 12/09/2013 FINDINGS: Lower chest: The lung bases are unremarkable.  Heart size is normal. Hepatobiliary: No focal liver abnormality is seen. No radiopaque gallstones, biliary dilatation, or pericholecystic inflammatory changes. Pancreas: Unremarkable. No pancreatic ductal dilatation or surrounding inflammatory  changes. Spleen: Normal in size without focal abnormality. Adrenals/Urinary Tract: Adrenal glands are normal in appearance. A low-attenuation lesion in the UPPER pole region of the LEFT kidney measures 2.6 centimeters and appears stable. There is no hydronephrosis. No intrarenal calculi. Urinary bladder is unremarkable. Stomach/Bowel: The stomach and small bowel loops are normal in appearance. Numerous colonic diverticula without associated diverticulitis. A small fat containing lesion is identified adjacent to the LOWER sigmoid colon, similar in appearance to a lesion seen in the central abdomen on previous study. This may represent a focal area of fat necrosis or other benign lesion. Vascular/Lymphatic: No significant vascular findings are present. No enlarged abdominal or pelvic lymph nodes. Reproductive: The uterus is present. No adnexal mass. Significant streak artifact in the pelvis. Other: No free pelvic fluid.  Anterior abdominal wall Musculoskeletal: Degenerative changes are seen in the LOWER thoracic and lumbar spine. IMPRESSION: 1. No bowel obstruction. 2. Colonic diverticulosis without acute diverticulitis. 3. Stable appearance of LEFT renal cyst. 4. Small benign fatty mass adjacent to the rectum, likely representing an area of fat necrosis or appendagitis. Electronically Signed   By: Nolon Nations M.D.   On: 10/16/2017 16:24   Dg Chest 2 View  Result Date: 10/16/2017 CLINICAL DATA:  Generalized weakness.  Dehydration. EXAM: CHEST - 2 VIEW COMPARISON:  November 17, 2013 FINDINGS: The heart size and mediastinal contours are within normal limits. Both lungs are clear. The visualized skeletal structures are unremarkable. IMPRESSION: No active cardiopulmonary disease. Electronically Signed   By: Dorise Bullion III M.D   On: 10/16/2017 11:32   Ct Head Wo Contrast  Result Date: 10/16/2017 CLINICAL DATA:  Nausea, vomiting, and diarrhea for the last month. Also having Dizziness. Denies pain EXAM: CT  HEAD WITHOUT CONTRAST TECHNIQUE: Contiguous axial images were obtained from the base of the skull through the vertex without intravenous contrast. COMPARISON:  None. FINDINGS: Brain: No evidence of acute infarction, hemorrhage, hydrocephalus, extra-axial collection or mass lesion/mass effect. Vascular: No hyperdense vessel or unexpected calcification. Skull: Normal. Negative for fracture or focal lesion. Sinuses/Orbits: Normal globes and orbits. Mild maxillary sinus mucosal thickening. Remaining visualized sinuses and mastoid  air cells are clear. Other: None. IMPRESSION: 1. No intracranial abnormality. Electronically Signed   By: Lajean Manes M.D.   On: 10/16/2017 16:16    EKG: Independently reviewed.  Sinus tachycardia, QTC 467, no acute ST-T changes  Assessment/Plan Present on Admission: . AKI (acute kidney injury) ([place])  Intractable nausea and vomiting with weight loss -Reports symptom has been going on for a few weeks, she remains feeling nauseous, is not able to keep anything down for the last week, last vomiting last bowel movement was a week ago -CT abdomen no acute findings -will order DG esophagus , then gastric emptying study ( she does has h/o diabetes) -start clear liquid diet as tolerated, supportive management with antiemetics and ivf.  AKI  -likely prerenal due to dehydration,  -CT abdomen no hydronephrosis, UA suggest possible UTI, culture pending collection, she does not appear septic , will hold off on abx -continue hydration, hold losartan Repeat lab in am, renal dosing meds  Insulin-dependent type 2 diabetes Recent A1c 7.1 Home oral meds , start on ssi  Hypertension Currently dehydrated, home home bp meds losartan Continue lopressor at reduced dose with holding parameter  History of recurrent SVT status post ablation, remained in sinus rhythm post ablation  Anemia of chronic disease: hgb stable at baseline   Chronic bilateral lower extremity lymphedema: -she  follows with wound care clinic  -wound care consulted, there is a open wound on left lateral shin, with mild bleeding, does not appear infected, no odor    DVT prophylaxis: heparin  Consultants: none  Code Status: full  Family Communication:  Patient   Disposition Plan: will likely need more than 72midnight stay, admit to admit to inpatient, med surg bed  Time spent: 18mins  Florencia Reasons MD, PhD [facility] Pager [phone] If 7PM-7AM, please contact night-coverage at [email or web address], password [facility]

## 2017-10-16 NOTE — ED Notes (Signed)
Patient transported to CT 

## 2017-10-16 NOTE — Progress Notes (Signed)
Patient arrived via stretcher from [facility]. Patient is alert and oriented x 4. Patient belongings with patient. Telemetry placed on patient box: 5. IV in rAC SL. Call bell within reach. Will continue to monitor.   Farley Ly RN

## 2017-10-16 NOTE — Discharge Instructions (Addendum)
Transported to the ED

## 2017-10-17 ENCOUNTER — Inpatient Hospital Stay (HOSPITAL_COMMUNITY): Payer: PPO

## 2017-10-17 DIAGNOSIS — R112 Nausea with vomiting, unspecified: Secondary | ICD-10-CM

## 2017-10-17 LAB — BASIC METABOLIC PANEL
Anion gap: 5 (ref 5–15)
BUN: 38 mg/dL — ABNORMAL HIGH (ref 8–23)
CO2: 20 mmol/L — ABNORMAL LOW (ref 22–32)
Calcium: 8.2 mg/dL — ABNORMAL LOW (ref 8.9–10.3)
Chloride: 110 mmol/L (ref 98–111)
Creatinine, Ser: 1.33 mg/dL — ABNORMAL HIGH (ref 0.44–1.00)
GFR calc Af Amer: 45 mL/min — ABNORMAL LOW (ref >60)
GFR calc non Af Amer: 39 mL/min — ABNORMAL LOW (ref >60)
Glucose, Bld: 90 mg/dL (ref 70–99)
Potassium: 4.7 mmol/L (ref 3.5–5.1)
Sodium: 135 mmol/L (ref 135–145)

## 2017-10-17 LAB — GLUCOSE, CAPILLARY
Glucose-Capillary: 89 mg/dL (ref 70–99)
Glucose-Capillary: 86 mg/dL (ref 70–99)
Glucose-Capillary: 72 mg/dL (ref 70–99)

## 2017-10-17 LAB — CBC
HCT: 24.6 % — ABNORMAL LOW (ref 36.0–46.0)
Hemoglobin: 7.6 g/dL — ABNORMAL LOW (ref 12.0–15.0)
MCH: 23.2 pg — ABNORMAL LOW (ref 26.0–34.0)
MCHC: 30.9 g/dL (ref 30.0–36.0)
MCV: 75.2 fL — ABNORMAL LOW (ref 78.0–100.0)
Platelets: 338 10*3/uL (ref 150–400)
RBC: 3.27 MIL/uL — ABNORMAL LOW (ref 3.87–5.11)
RDW: 17.5 % — ABNORMAL HIGH (ref 11.5–15.5)
WBC: 5.9 10*3/uL (ref 4.0–10.5)

## 2017-10-17 IMAGING — CT CT CHEST W/O CM
2 of 5 series · 15 of 36 positions shown, 18 images · non-contrast
Comparison: Chest radiograph, [DATE].  Chest CT, [DATE].

CLINICAL DATA: Patient mistakenly scanned. Previous day's chest
radiograph had an indication of generalized weakness and
dehydration.

EXAM:
CT CHEST WITHOUT CONTRAST
TECHNIQUE: Multidetector CT imaging of the chest was performed following the
standard protocol without IV contrast.

[Series 5: thins · axial · 0.88mm/px · z∈[+1369,+1658]mm · 12 of 411 slices shown, 15 images]
[im 25/411  mediastinal]
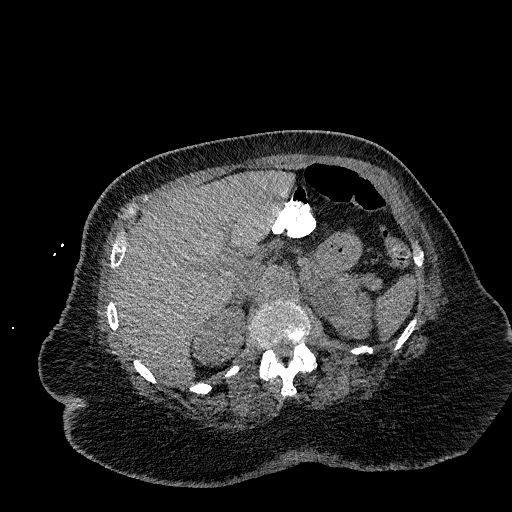
[im 25/411  lung]
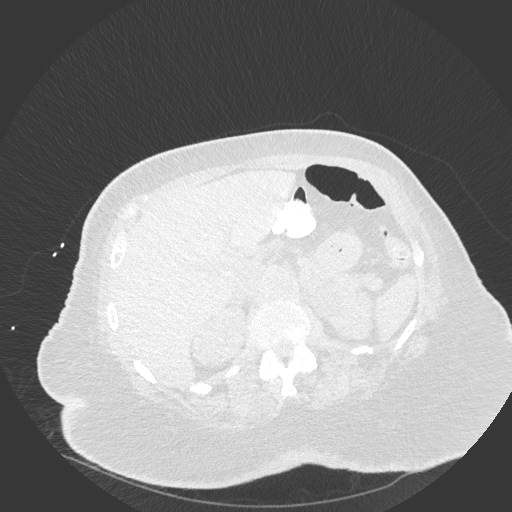
[im 73/411  lung]
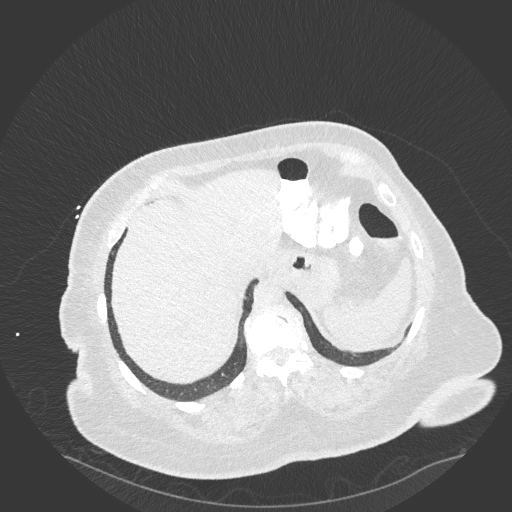
[im 97/411  lung]
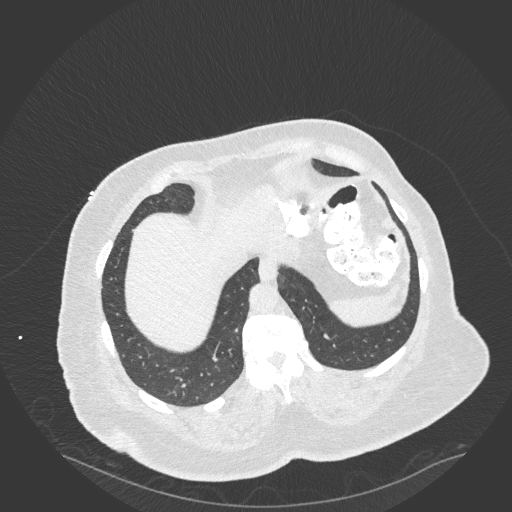
[im 121/411  lung]
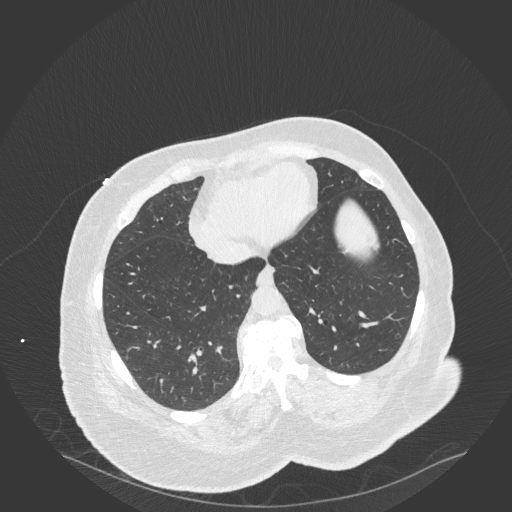
[im 169/411  mediastinal]
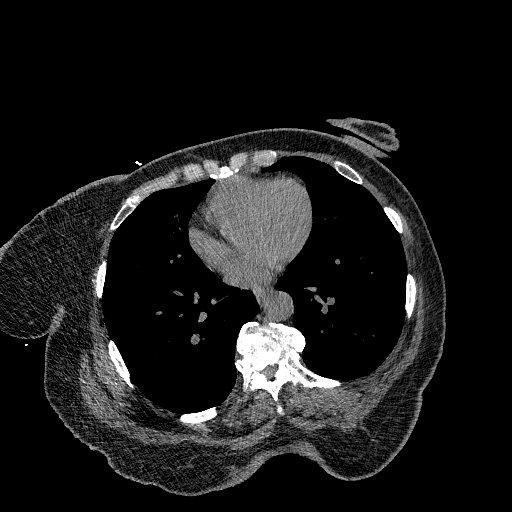
[im 169/411  lung]
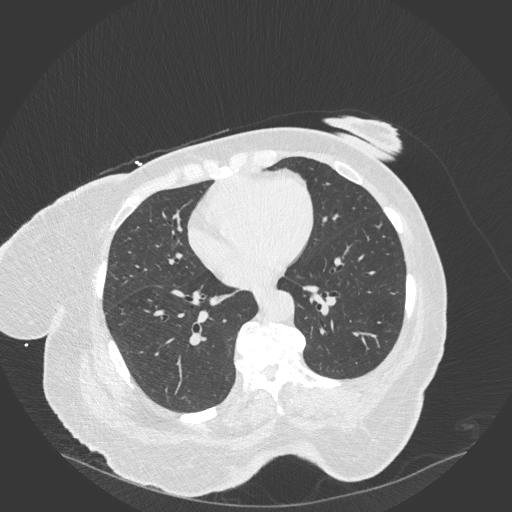
[im 193/411  lung]
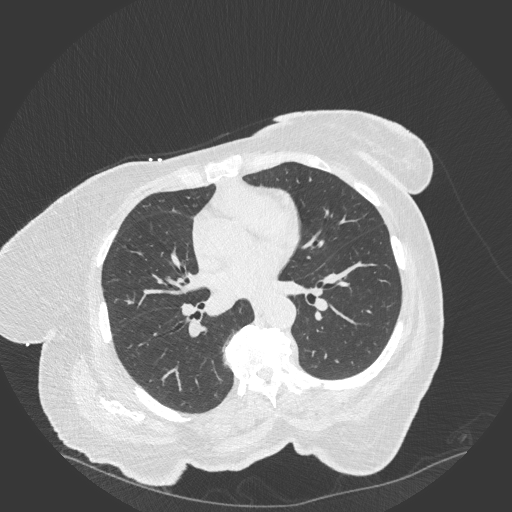
[im 218/411  lung]
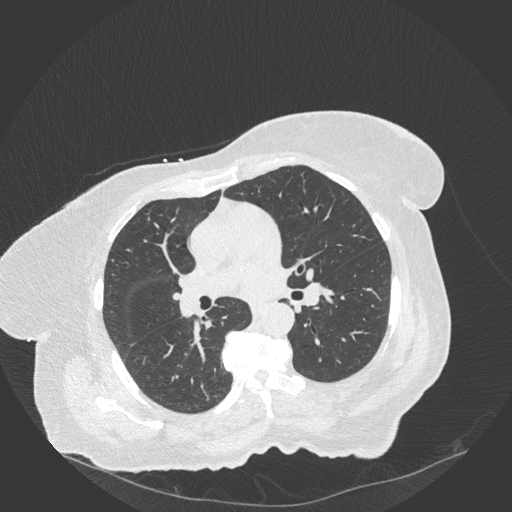
[im 266/411  lung]
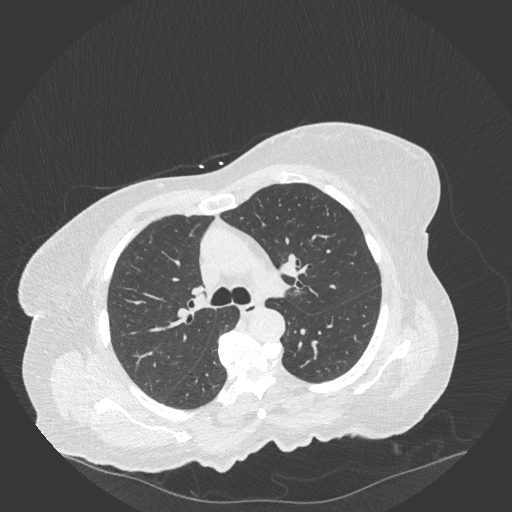
[im 290/411  mediastinal]
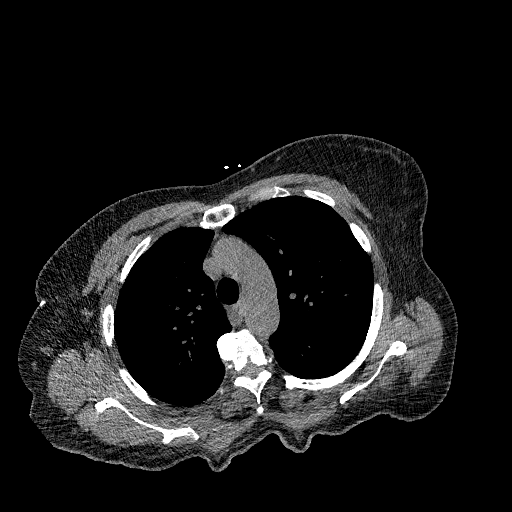
[im 290/411  lung]
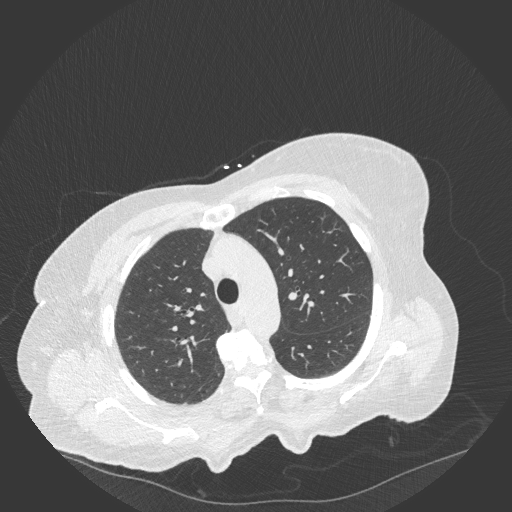
[im 314/411  lung]
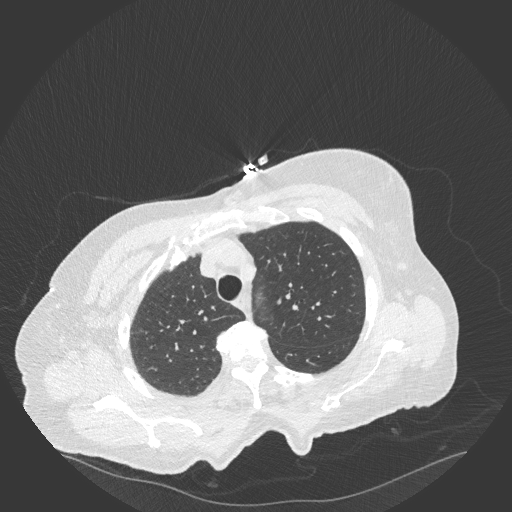
[im 362/411  lung]
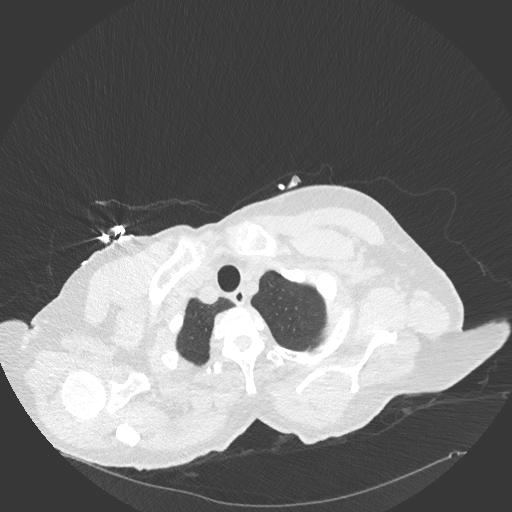
[im 386/411  lung]
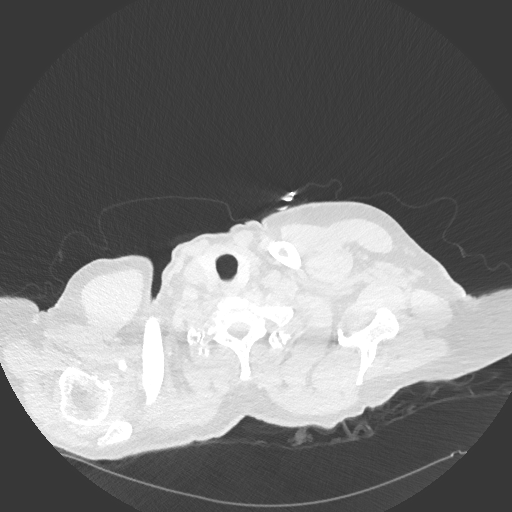

[Series 6: coronal · coronal · 0.65mm/px · 3 of 117 slices shown]
[im 24/117  lung]
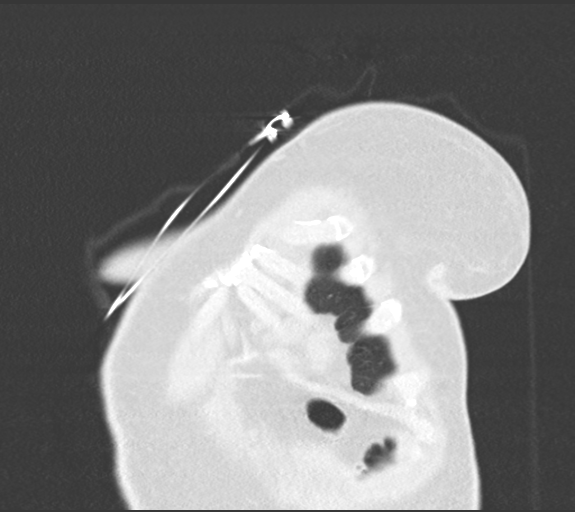
[im 47/117  lung]
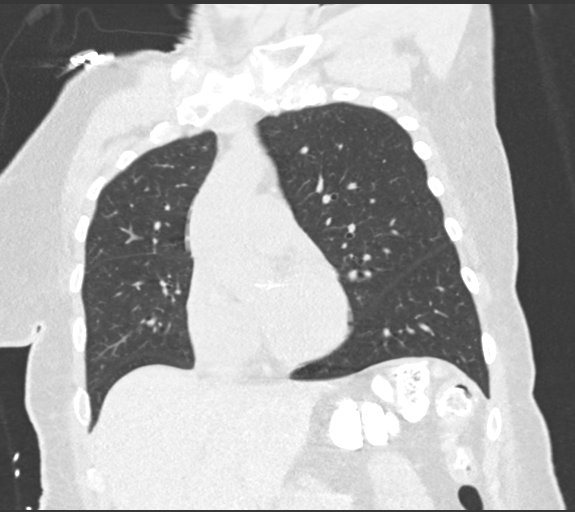
[im 70/117  lung]
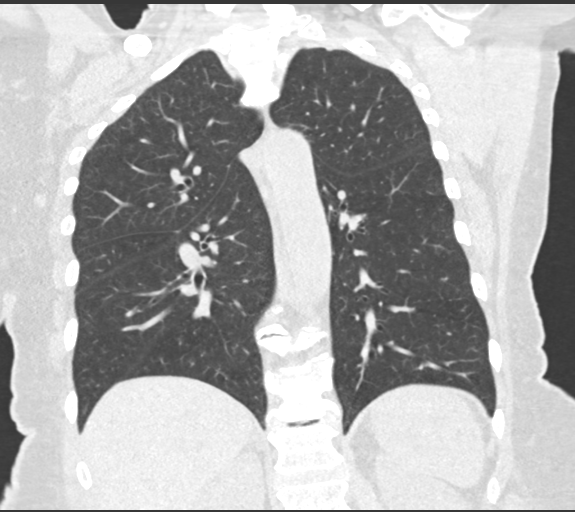

[15 of 36 positions shown; findings below may reference images not displayed]

FINDINGS: Cardiovascular: Heart normal in size and configuration. No
pericardial effusion. Mild left and right coronary artery
calcifications. Great vessels normal in caliber. No significant
aortic atherosclerosis

Mediastinum/Nodes: No enlarged mediastinal or axillary lymph nodes.
Thyroid gland, trachea, and esophagus demonstrate no significant
findings.

Lungs/Pleura: Lungs are clear. No pleural effusion or pneumothorax.

Upper Abdomen: No acute abnormality.

Musculoskeletal: No fracture or acute finding. No osteoblastic or
osteolytic lesions.
IMPRESSION: 1. No acute findings.  Lungs are clear.

## 2017-10-17 MED ORDER — INSULIN ASPART 100 UNIT/ML ~~LOC~~ SOLN: 0.0000 [IU] | Freq: Three times a day (TID) | SUBCUTANEOUS | Status: DC

## 2017-10-17 NOTE — Progress Notes (Signed)
PROGRESS NOTE    [name] [name]  OHY:[id number] DOB: 12-18-1945 DOA: 10/16/2017 PCP: Hoyt Koch, MD     Brief Narrative:  [name] [name] is a 72 yo female with history of SVT s/p ablation in 2015, NIDDM2, HTN. She was sent from primary care doctor's office to [facility] ED due to nausea, vomiting, diarrhea, weakness.  Per primary care physician "severe problem of nausea and vomiting. She cannot keep anything down much. She is drinking water and some popsicles which can stay down. She is not eating any solids. No BM for the last 2-3 days. Previous to this she was having liquid stools. She denies abdominal pain or swelling."   New events last 24 hours / Subjective: She admits to nausea but no vomiting or diarrhea since admission.  No chest pain or abdominal pain reported.  Assessment & Plan:   Principal Problem:   Nausea & vomiting Active Problems:   AKI (acute kidney injury) ([facility])   Intractable nausea, vomiting, diarrhea -Could be secondary to viral gastroenteritis, although symptoms have been going on for about a month -CT abdomen and pelvis revealed no acute findings, no bowel obstruction.  Positive for colonic diverticulosis without diverticulitis, small benign fatty mass next to the rectum -No further vomiting or diarrhea since admission -Advance to full liquid diet today  -DG esophagus pending, if normal could pursue gastric emptying study -Stool PCR pending, but no diarrhea so far this admission  Possible UTI -UA with many bacteria, trace leukocytes -Urine culture pending   Microcytic amenia -Compounded by hemodilution, no report of blood loss this admission -Monitor   AKI -Improved, IVF -Hold cozaar   DM type 2 -SSI  HTN -Lopressor  Chronic bilateral lower extremity lymphedema -Followed by wound clinic -Wound RN consulted     DVT prophylaxis: Subq hep Code Status: Full Family Communication: No family at bedside Disposition Plan: Pending  improvement, PT eval   Consultants:   None  Procedures:   None   Antimicrobials:  Anti-infectives (From admission, onward)   None        Objective: Vitals:   10/16/17 1810 10/16/17 2018 10/17/17 0548 10/17/17 0735  BP: 116/70 (!) 106/54 (!) 97/50 (!) 94/53  Pulse: (!) 104 (!) 104 81 79  Resp: 18 18 17 18   Temp: 97.9 F (36.6 C) 99.1 F (37.3 C) 98.8 F (37.1 C) 98.1 F (36.7 C)  TempSrc: Oral Oral Oral Oral  SpO2:  100% 98% 98%  Weight:  122.5 kg (270 lb 0.1 oz)    Height:        Intake/Output Summary (Last 24 hours) at 10/17/2017 1300 Last data filed at 10/17/2017 1125 Gross per 24 hour  Intake 1360.4 ml  Output 650 ml  Net 710.4 ml   Filed Weights   10/16/17 1321 10/16/17 2018  Weight: 122.5 kg (270 lb) 122.5 kg (270 lb 0.1 oz)    Examination:  General exam: Appears calm and comfortable  Respiratory system: Clear to auscultation. Respiratory effort normal. Cardiovascular system: S1 & S2 heard, RRR. No JVD, murmurs, rubs, gallops or clicks. Gastrointestinal system: Abdomen is nondistended, soft and nontender. No organomegaly or masses felt. Normal bowel sounds heard. Central nervous system: Alert and oriented. No focal neurological deficits. Extremities: Symmetric Psychiatry: Judgement and insight appear normal. Mood & affect appropriate.   Data Reviewed: I have personally reviewed following labs and imaging studies  CBC: Recent Labs  Lab 10/11/17 0827 10/15/17 0949 10/16/17 1105 10/17/17 0402  WBC 8.2 10.7* 7.4 5.9  NEUTROABS  --   --  5.7  --   HGB 9.9* 10.2* 9.9* 7.6*  HCT 29.8* 31.5* 32.6* 24.6*  MCV 72.4* 73.1* 76.0* 75.2*  PLT 563.0* 555.0* 469* 528   Basic Metabolic Panel: Recent Labs  Lab 10/11/17 0827 10/15/17 0949 10/16/17 1105 10/16/17 1330 10/17/17 0402  NA 133* 133* 134*  --  135  K 4.4 4.9 4.7  --  4.7  CL 103 103 104  --  110  CO2 19 18* 18*  --  20*  GLUCOSE 149* 173* 194*  --  90  BUN 55* 61* 58*  --  38*    CREATININE 1.48* 2.20* 2.01*  --  1.33*  CALCIUM 9.5 9.5 9.2  --  8.2*  MG  --   --   --  2.4  --    GFR: Estimated Creatinine Clearance: 56 mL/min (A) (by C-G formula based on SCr of 1.33 mg/dL (H)). Liver Function Tests: Recent Labs  Lab 10/11/17 0827 10/15/17 0949 10/16/17 1105  AST 18 14 22   ALT 14 12 16   ALKPHOS 59 74 72  BILITOT 0.4 0.4 0.5  PROT 8.8* 9.0* 8.6*  ALBUMIN 3.5 3.4* 2.9*   Recent Labs  Lab 10/11/17 0827 10/15/17 0949  LIPASE 22.0 18.0   No results for input(s): AMMONIA in the last 168 hours. Coagulation Profile: No results for input(s): INR, PROTIME in the last 168 hours. Cardiac Enzymes: No results for input(s): CKTOTAL, CKMB, CKMBINDEX, TROPONINI in the last 168 hours. BNP (last 3 results) No results for input(s): PROBNP in the last 8760 hours. HbA1C: No results for input(s): HGBA1C in the last 72 hours. CBG: Recent Labs  Lab 10/16/17 1809 10/17/17 0733 10/17/17 1141  GLUCAP 91 89 86   Lipid Profile: No results for input(s): CHOL, HDL, LDLCALC, TRIG, CHOLHDL, LDLDIRECT in the last 72 hours. Thyroid Function Tests: No results for input(s): TSH, T4TOTAL, FREET4, T3FREE, THYROIDAB in the last 72 hours. Anemia Panel: No results for input(s): VITAMINB12, FOLATE, FERRITIN, TIBC, IRON, RETICCTPCT in the last 72 hours. Sepsis Labs: Recent Labs  Lab 10/16/17 1118 10/16/17 1336  LATICACIDVEN 1.97* 1.45    No results found for this or any previous visit (from the past 240 hour(s)).     Radiology Studies: Ct Abdomen Pelvis Wo Contrast  Result Date: 10/16/2017 CLINICAL DATA:  Nausea, vomiting, and diarrhea for the last month. Denies pain EXAM: CT ABDOMEN AND PELVIS WITHOUT CONTRAST TECHNIQUE: Multidetector CT imaging of the abdomen and pelvis was performed following the standard protocol without IV contrast. COMPARISON:  CT of the abdomen and pelvis on 12/09/2013 FINDINGS: Lower chest: The lung bases are unremarkable.  Heart size is normal.  Hepatobiliary: No focal liver abnormality is seen. No radiopaque gallstones, biliary dilatation, or pericholecystic inflammatory changes. Pancreas: Unremarkable. No pancreatic ductal dilatation or surrounding inflammatory changes. Spleen: Normal in size without focal abnormality. Adrenals/Urinary Tract: Adrenal glands are normal in appearance. A low-attenuation lesion in the UPPER pole region of the LEFT kidney measures 2.6 centimeters and appears stable. There is no hydronephrosis. No intrarenal calculi. Urinary bladder is unremarkable. Stomach/Bowel: The stomach and small bowel loops are normal in appearance. Numerous colonic diverticula without associated diverticulitis. A small fat containing lesion is identified adjacent to the LOWER sigmoid colon, similar in appearance to a lesion seen in the central abdomen on previous study. This may represent a focal area of fat necrosis or other benign lesion. Vascular/Lymphatic: No significant vascular findings are present. No enlarged abdominal or pelvic  lymph nodes. Reproductive: The uterus is present. No adnexal mass. Significant streak artifact in the pelvis. Other: No free pelvic fluid.  Anterior abdominal wall Musculoskeletal: Degenerative changes are seen in the LOWER thoracic and lumbar spine. IMPRESSION: 1. No bowel obstruction. 2. Colonic diverticulosis without acute diverticulitis. 3. Stable appearance of LEFT renal cyst. 4. Small benign fatty mass adjacent to the rectum, likely representing an area of fat necrosis or appendagitis. Electronically Signed   By: Nolon Nations M.D.   On: 10/16/2017 16:24   Dg Chest 2 View  Result Date: 10/16/2017 CLINICAL DATA:  Generalized weakness.  Dehydration. EXAM: CHEST - 2 VIEW COMPARISON:  November 17, 2013 FINDINGS: The heart size and mediastinal contours are within normal limits. Both lungs are clear. The visualized skeletal structures are unremarkable. IMPRESSION: No active cardiopulmonary disease. Electronically  Signed   By: Dorise Bullion III M.D   On: 10/16/2017 11:32   Ct Head Wo Contrast  Result Date: 10/16/2017 CLINICAL DATA:  Nausea, vomiting, and diarrhea for the last month. Also having Dizziness. Denies pain EXAM: CT HEAD WITHOUT CONTRAST TECHNIQUE: Contiguous axial images were obtained from the base of the skull through the vertex without intravenous contrast. COMPARISON:  None. FINDINGS: Brain: No evidence of acute infarction, hemorrhage, hydrocephalus, extra-axial collection or mass lesion/mass effect. Vascular: No hyperdense vessel or unexpected calcification. Skull: Normal. Negative for fracture or focal lesion. Sinuses/Orbits: Normal globes and orbits. Mild maxillary sinus mucosal thickening. Remaining visualized sinuses and mastoid air cells are clear. Other: None. IMPRESSION: 1. No intracranial abnormality. Electronically Signed   By: Lajean Manes M.D.   On: 10/16/2017 16:16      Scheduled Meds: . heparin  5,000 Units Subcutaneous Q8H  . metoprolol tartrate  12.5 mg Oral BID  . pantoprazole  20 mg Oral Daily   Continuous Infusions: . sodium chloride 75 mL/hr at 10/17/17 0714     LOS: 1 day    Time spent: 25 minutes   Dessa Phi, DO [facility] [email or web address] Password [email or web address] 10/17/2017, 1:00 PM

## 2017-10-18 ENCOUNTER — Inpatient Hospital Stay (HOSPITAL_COMMUNITY): Payer: PPO

## 2017-10-18 ENCOUNTER — Telehealth: Payer: Self-pay

## 2017-10-18 ENCOUNTER — Inpatient Hospital Stay: Admission: RE | Admit: 2017-10-18 | Payer: PPO | Source: Ambulatory Visit

## 2017-10-18 LAB — BASIC METABOLIC PANEL
Anion gap: 6 (ref 5–15)
BUN: 21 mg/dL (ref 8–23)
CO2: 21 mmol/L — ABNORMAL LOW (ref 22–32)
Calcium: 8.3 mg/dL — ABNORMAL LOW (ref 8.9–10.3)
Chloride: 110 mmol/L (ref 98–111)
Creatinine, Ser: 1.06 mg/dL — ABNORMAL HIGH (ref 0.44–1.00)
GFR calc Af Amer: 60 mL/min — ABNORMAL LOW (ref >60)
GFR calc non Af Amer: 52 mL/min — ABNORMAL LOW (ref >60)
Glucose, Bld: 83 mg/dL (ref 70–99)
Potassium: 4.6 mmol/L (ref 3.5–5.1)
Sodium: 137 mmol/L (ref 135–145)

## 2017-10-18 LAB — CBC
HCT: 26.9 % — ABNORMAL LOW (ref 36.0–46.0)
Hemoglobin: 8 g/dL — ABNORMAL LOW (ref 12.0–15.0)
MCH: 22.9 pg — ABNORMAL LOW (ref 26.0–34.0)
MCHC: 29.7 g/dL — ABNORMAL LOW (ref 30.0–36.0)
MCV: 76.9 fL — ABNORMAL LOW (ref 78.0–100.0)
Platelets: 362 10*3/uL (ref 150–400)
RBC: 3.5 MIL/uL — ABNORMAL LOW (ref 3.87–5.11)
RDW: 18 % — ABNORMAL HIGH (ref 11.5–15.5)
WBC: 5.1 10*3/uL (ref 4.0–10.5)

## 2017-10-18 LAB — GLUCOSE, CAPILLARY
Glucose-Capillary: 115 mg/dL — ABNORMAL HIGH (ref 70–99)
Glucose-Capillary: 76 mg/dL (ref 70–99)
Glucose-Capillary: 87 mg/dL (ref 70–99)

## 2017-10-18 IMAGING — RF DG ESOPHAGUS
5 series · 19 of 20 positions shown · non-contrast
Comparison: None.

CLINICAL DATA: Nausea, vomiting, difficulty swallowing

EXAM:
ESOPHOGRAM/BARIUM SWALLOW
TECHNIQUE: Single contrast examination was performed using  thin barium.
FLUOROSCOPY TIME:  Fluoroscopy Time:  2 minutes
Radiation Exposure Index (if provided by the fluoroscopic device):
Number of Acquired Spot Images: 0

[Series 1: cp_standard · 0.34mm/px · 4 of 233 frames shown (1 of 5)]
[frame 18/233]
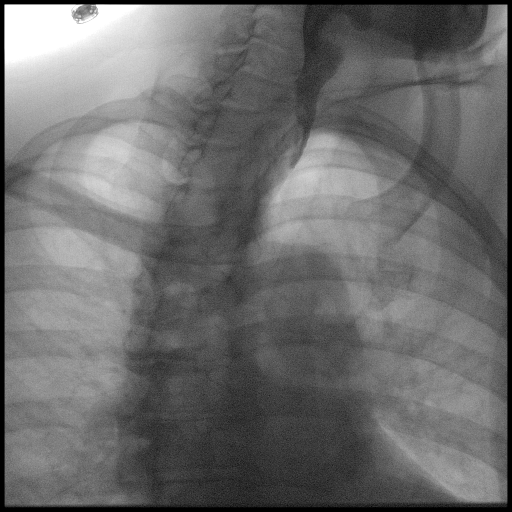
[frame 35/233]
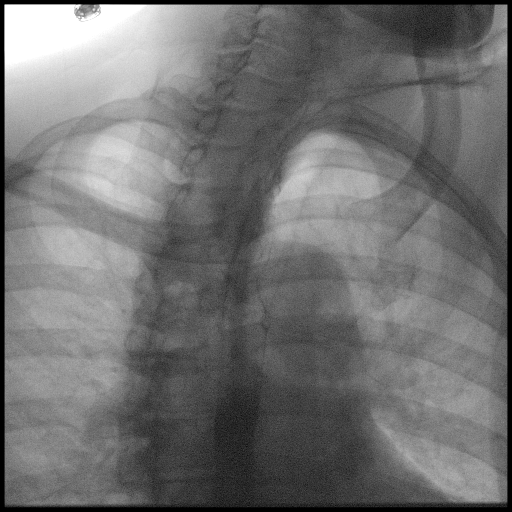
[frame 117/233]
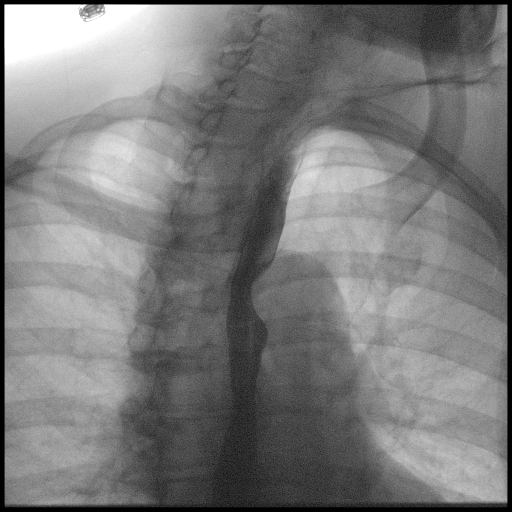
[frame 199/233]
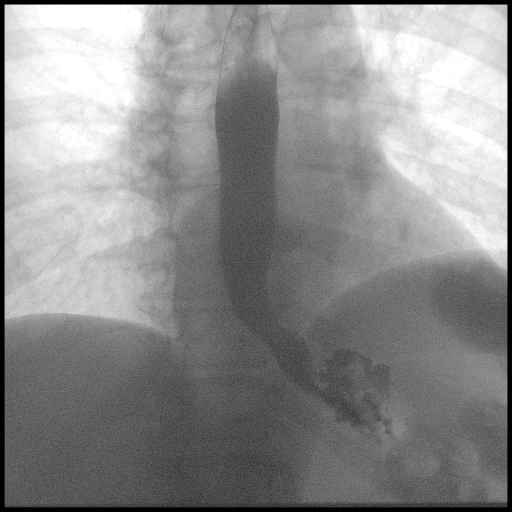

[Series 2: cp_standard · 0.34mm/px · 4 of 135 frames shown (2 of 5)]
[frame 21/135]
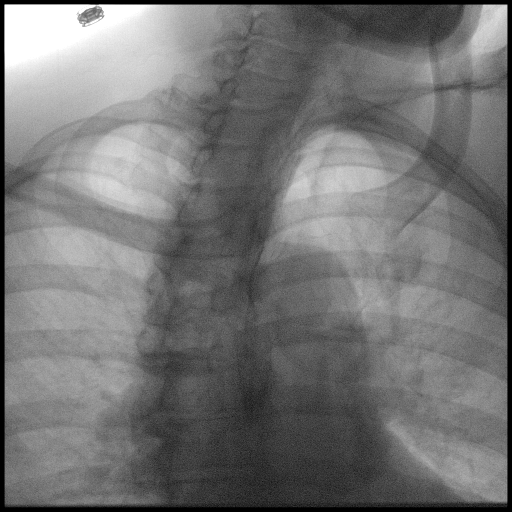
[frame 23/135]
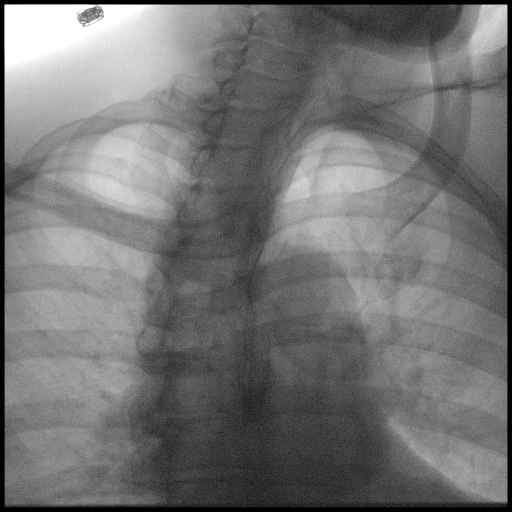
[frame 68/135]
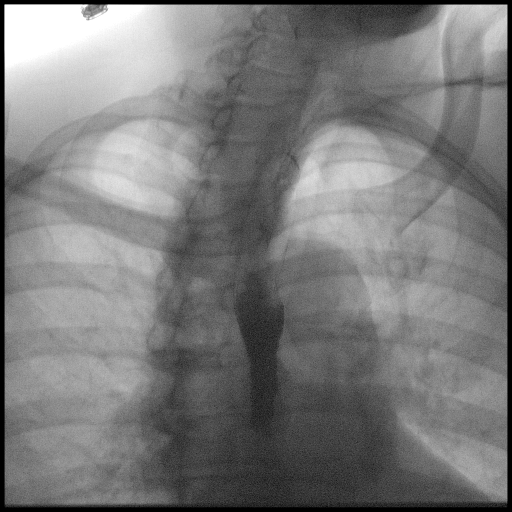
[frame 115/135]
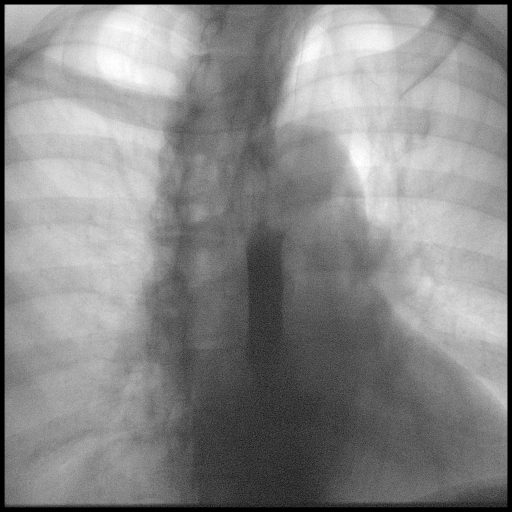

[Series 3: cp_standard · 0.34mm/px · 3 of 95 frames shown (3 of 5)]
[frame 5/95]
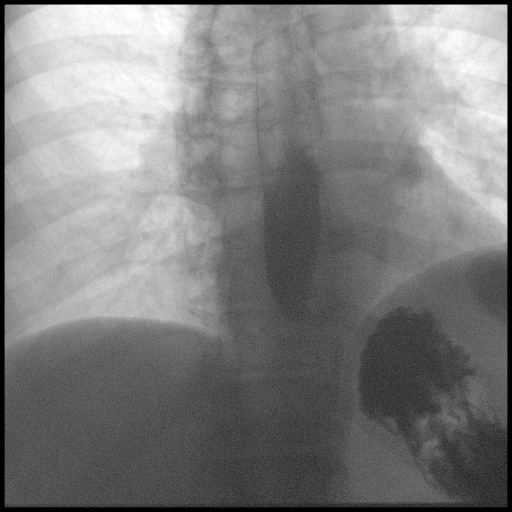
[frame 48/95]
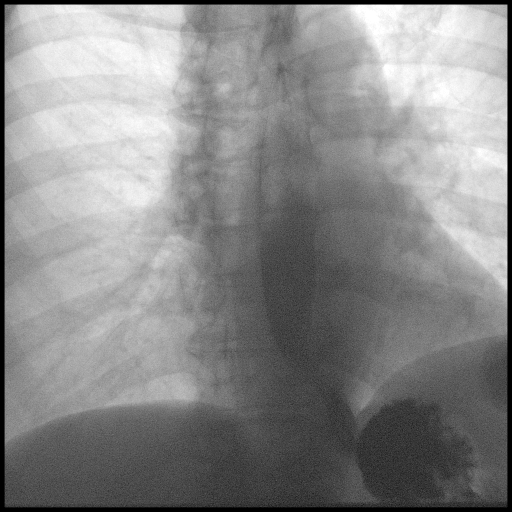
[frame 81/95]
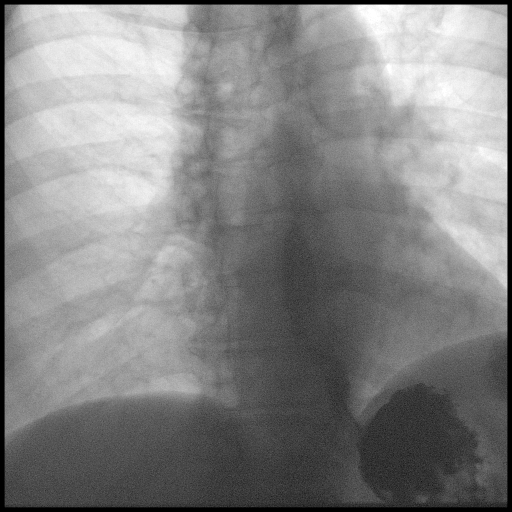

[Series 4: cp_standard · 0.34mm/px · 4 of 199 frames shown (4 of 5)]
[frame 30/199]
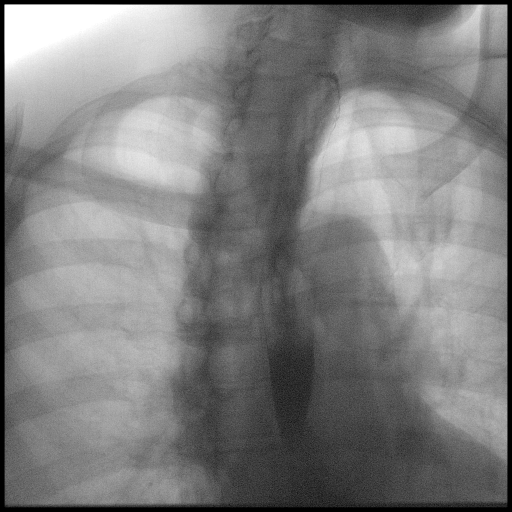
[frame 100/199]
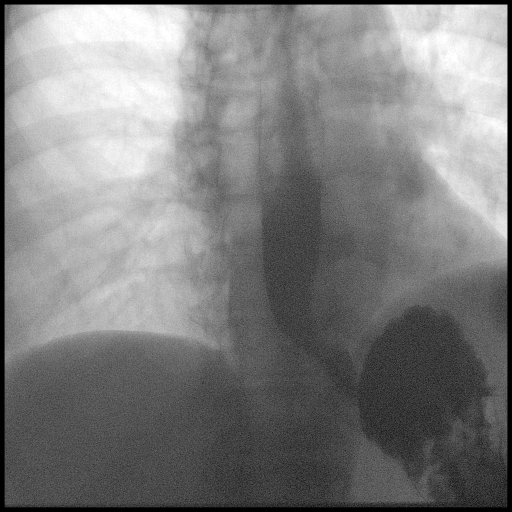
[frame 170/199]
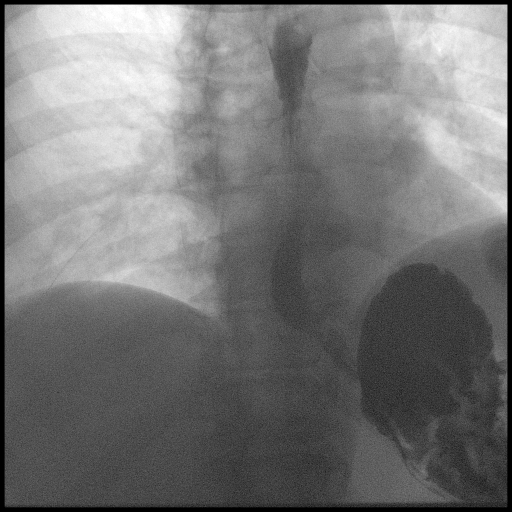
[frame 180/199]
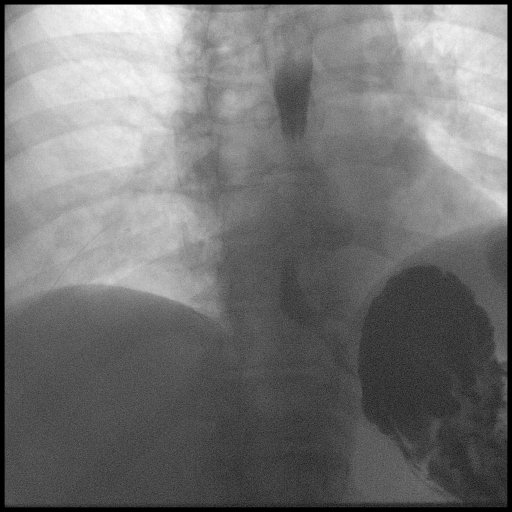

[Series 5: cp_standard · 0.34mm/px · 4 of 157 frames shown (5 of 5)]
[frame 3/157]
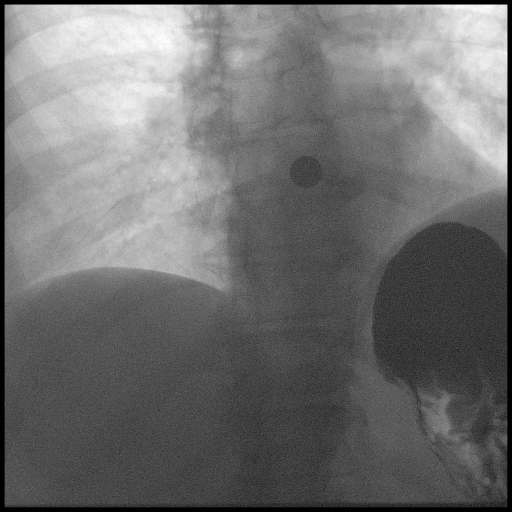
[frame 24/157]
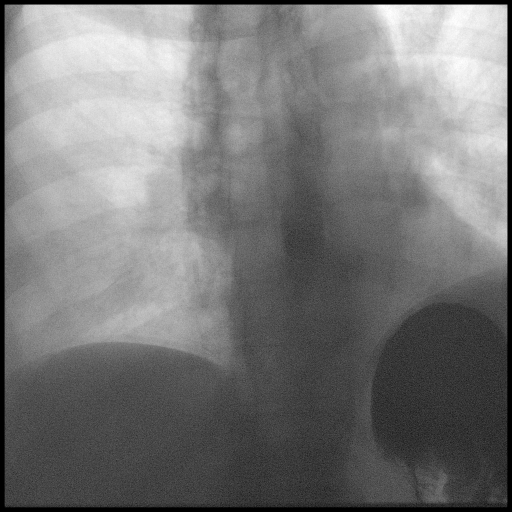
[frame 79/157]
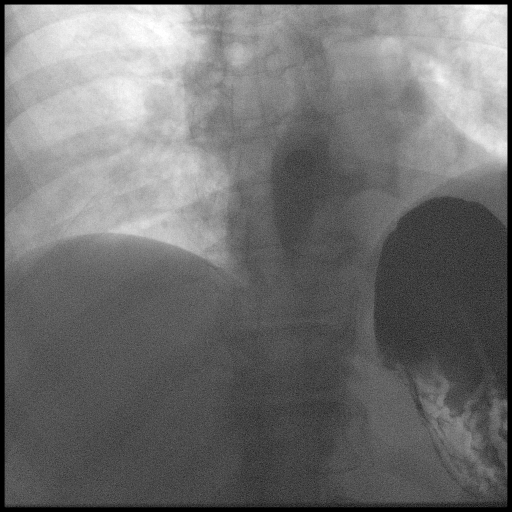
[frame 134/157]
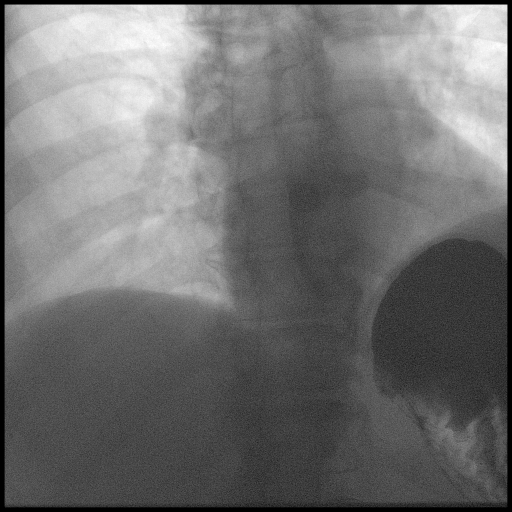

[19 of 20 positions shown; findings below may reference images not displayed]

FINDINGS: Fluoroscopic evaluation of swallowing demonstrates disruption of
primary esophageal peristaltic waves. No visible fixed stricture. No
reflux with the water siphon maneuver. The 13 mm barium tablet sits
in the distal esophagus despite lack of visible stricture. This does
not reproduce the patient's symptoms.
IMPRESSION: No visible fixed stricture.

Esophageal dysmotility.

## 2017-10-18 MED ORDER — ONDANSETRON HCL 4 MG PO TABS: 4.0000 mg | ORAL_TABLET | Freq: Three times a day (TID) | ORAL | 0 refills | Status: DC | PRN

## 2017-10-18 MED ORDER — FOSFOMYCIN TROMETHAMINE 3 G PO PACK
3.0000 g | PACK | Freq: Once | ORAL | Status: AC
  Administered 2017-10-18: 3 g via ORAL
  Filled 2017-10-18: qty 3

## 2017-10-18 NOTE — Consult Note (Signed)
[facility] Nurse wound consult note Reason for Consult:Lymphedema and bilateral nonintact lesions to lower legs.  Noncompliant with pneumatic compression pumps.  Has osteoarthritis in both knees and states that the pumps increase her pain. She has not followed up with wound care center in a long time, she reports.  Wound type: Lymphedema and nonintact lesions Pressure Injury POA: NA Measurement:LEft posterior lower leg:  7 cm x 8 cm x 0.3 cm circumferential full thickness skin loss.  Right posterior lower leg:  6 cm x 10 cm x 0.3 cm  Wound HKG:OVPCH red, friable and tender to touch Drainage (amount, consistency, odor) moderate serosanguinous with musty odor.   Periwound:Edema and chronic skin changes from feet to above knees.  Agrees to try removable compression again once she is home and to follow up with wound care center.  Dressing procedure/placement/frequency:Cleanse bilateral lower legs with soap and water.  Apply Aquacel Ag to wounds on lower legs and cover with ABD pad and kerlix/tape.  ORtho tech to apply [employer].  Change weekly.  Will not follow at this time.  Please re-consult if needed.  Domenic Moras RN BSN [place] Pager [phone]

## 2017-10-18 NOTE — Care Management Note (Signed)
Case Management Note  Patient Details  Name: [name] [name] MRN: [id number] Date of Birth: 09/29/1945  Subjective/Objective:   Patient has bilateral lymphedema with lesions.                 Action/Plan: Case manager was able to get patient to agree to [facility] for wound care. Choice for Home Health agency was offered, referral was called to Carolynn Comment, [facility]. Social Worker was able to get [facility] to pick patient uop at discharge.   Expected Discharge Date:  10/18/17               Expected Discharge Plan:  [place]  In-House Referral:  NA  Discharge planning Services  CM Consult  Post Acute Care Choice:  Home Health Choice offered to:  Patient  DME Arranged:  (has rollator) DME Agency:  NA  HH Arranged:  RN [place] Agency:  [facility]  Status of Service:  Completed, signed off  If discussed at [place] of Stay Meetings, dates discussed:    Additional Comments:  Ninfa Meeker, RN 10/18/2017, 4:40 PM

## 2017-10-18 NOTE — Evaluation (Signed)
Physical Therapy Evaluation Patient Details Name: [name] [name] MRN: [id number] DOB: [birth year] Today's Date: 10/18/2017   History of Present Illness  [name] [name] is a 72 yo female with history of SVT s/p ablation in 2015, NIDDM2, HTN. She was sent from primary care doctor's office to [facility] ED due to nausea, vomiting, diarrhea, weakness.   Clinical Impression   Pt admitted with above diagnosis. Pt currently with functional limitations due to the deficits listed below (see PT Problem List). Independent community ambulator with 4 wheeled RW prior to admission; Presents with decr functional mobility and decr activity tolerance; Rec SNF for post-acute rehab to maximize independence and safety with mobility prior to dc home;  Pt will benefit from skilled PT to increase their independence and safety with mobility to allow discharge to the venue listed below.       Follow Up Recommendations SNF; if [name] refuses SNF, recommend maximizing HH services (HHPT/OT/Aide/RN/SW)    Equipment Recommendations  Other (comment)(TBD at SNF)    Recommendations for Other Services Other (comment)(SW to facilitate dc to SNF)     Precautions / Restrictions Precautions Precautions: Fall      Mobility  Bed Mobility Overal bed mobility: Modified Independent             General bed mobility comments: HOB elevated; used rails  Transfers Overall transfer level: Needs assistance Equipment used: 4-wheeled walker Transfers: Sit to/from Stand Sit to Stand: Min guard         General transfer comment: Difficulty rising from low surface; Unna boot wraps hindering knee flexion to be able to get her feet underneath her; Stood from significantly elevated bed  Ambulation/Gait Ambulation/Gait assistance: Counsellor (Feet): 40 Feet Assistive device: Rolling walker (2 wheeled) Gait Pattern/deviations: Step-through pattern;Decreased step length - right;Decreased step length -  left;Decreased stride length;Trunk flexed     General Gait Details: Cues to self-monitor for activity tolerance; Trunk flexed with heavy dependence on UE support from [employer] Mobility    Modified Rankin (Stroke Patients Only)       Balance Overall balance assessment: Needs assistance Sitting-balance support: No upper extremity supported Sitting balance-Leahy Scale: Good       Standing balance-Leahy Scale: Poor                               Pertinent Vitals/Pain Pain Assessment: No/denies pain    Home Living Family/patient expects to be discharged to:: Private residence Living Arrangements: Alone   Type of Home: Apartment Home Access: Level entry     Home Layout: One level Home Equipment: Walker - 4 wheels;Tub bench Additional Comments: Reports her apartment is handicap equipped    Prior Function Level of Independence: Independent with assistive device(s)         Comments: Community ambulator with Rollator RW; uses the bus system to get around     [employer]        Extremity/Trunk Assessment   Upper Extremity Assessment Upper Extremity Assessment: Generalized weakness    Lower Extremity Assessment Lower Extremity Assessment: Generalized weakness(Bilateral Unna boot wraps applied, leading to decr knee flexion ROM; body habitus hindering hip ROM bilaterally)       Communication   Communication: No difficulties  Cognition Arousal/Alertness: Awake/alert Behavior During Therapy: WFL for tasks assessed/performed Overall Cognitive Status: Within Functional Limits for tasks assessed  General Comments: Voiced frustration with her discharge, and the general situation      General Comments      Exercises     Assessment/Plan    PT Assessment Patient needs continued PT services  PT Problem List Decreased strength;Decreased range of motion;Decreased activity  tolerance;Decreased balance;Decreased mobility;Decreased knowledge of use of DME;Decreased knowledge of precautions;Obesity       PT Treatment Interventions DME instruction;Gait training;Functional mobility training;Therapeutic activities;Therapeutic exercise;Patient/family education;Balance training    PT Goals (Current goals can be found in the Care Plan section)  Acute Rehab PT Goals Patient Stated Goal: back to independence PT Goal Formulation: (with patient) Time For Goal Achievement: 11/01/17 Potential to Achieve Goals: Fair    Frequency Min 2X/week   Barriers to discharge Decreased caregiver support Rec SNF for post-acute rehab    Co-evaluation               AM-PAC PT "6 Clicks" Daily Activity  Outcome Measure Difficulty turning over in bed (including adjusting bedclothes, sheets and blankets)?: A Little Difficulty moving from lying on back to sitting on the side of the bed? : A Little Difficulty sitting down on and standing up from a chair with arms (e.g., wheelchair, bedside commode, etc,.)?: A Lot Help needed moving to and from a bed to chair (including a wheelchair)?: A Little Help needed walking in hospital room?: A Little Help needed climbing 3-5 steps with a railing? : Total 6 Click Score: 15    End of Session Equipment Utilized During Treatment: Gait belt Activity Tolerance: Patient tolerated treatment well Patient left: in chair;with call bell/phone within reach Nurse Communication: Mobility status PT Visit Diagnosis: Unsteadiness on feet (R26.81);Other abnormalities of gait and mobility (R26.89);Muscle weakness (generalized) (M62.81)    Time: 0160-1093 PT Time Calculation (min) (ACUTE ONLY): 21 min   Charges:   PT Evaluation $PT Eval Moderate Complexity: 1 Mod     PT G Codes:        Roney Marion, PT  Acute Rehabilitation Services Pager [phone] Office [phone]   [place] 10/18/2017, 3:56 PM

## 2017-10-18 NOTE — Discharge Summary (Addendum)
Physician Discharge Summary  [name] [name] MLY:[id number] DOB: 07-04-1945 DOA: 10/16/2017  PCP: Hoyt Koch, MD  Admit date: 10/16/2017 Discharge date: 10/18/2017  Admitted From: Home Disposition:  Home, declined SNF   Recommendations for Outpatient Follow-up:  1. Follow up with PCP in 1 week 2. Consider outpatient GI evaluation for esophageal dysmotility  3. Please repeat BMP in 1 week to make sure Cr remains stable. AKI improved during hospitalization. 4. Please follow up on urine culture sensitivity result. Patient given fosfomycin prior to discharge.   Discharge Condition: Stable CODE STATUS: Full Diet recommendation: Soft diet, advance as tolerated to heart healthy diet   Home health PT OT RN SW   Wound care: Cleanse bilateral lower legs with soap and water.  Apply Aquacel Ag to wounds on lower legs and cover with ABD pad and kerlix/tape.  Ortho tech applied [employer]. Change weekly.     Brief/Interim Summary: [name] [name] is a 72 yo female with history of SVT s/p ablation in 2015, NIDDM2, HTN. She was sent from primary care doctor's office to [facility] ED due to nausea, vomiting, diarrhea, weakness.Per primary care physician "severe problem of nausea and vomiting. She cannot keep anything down much. She is drinking water and some popsicles which can stay down. She is not eating any solids. No BM for the last 2-3 days. Previous to this she was having liquid stools. She denies abdominal pain or swelling."   She was admitted for nausea, vomiting, diarrhea, AKI. During her hospitalization, she did not have any vomiting or diarrhea episodes. CT A/P revealed no acute findings, no bowel obstruction.  Positive for colonic diverticulosis without diverticulitis, small benign fatty mass next to the rectum. She was given IVF and diet slowly advanced from clear liquid diet to soft diet. Patient tolerated well without any vomiting or abdominal pain. DG esophagus showed no acute  findings other than esophageal dysmotility. AKI resolved with IVF.   Discharge Diagnoses:  Principal Problem:   Nausea & vomiting Active Problems:   AKI (acute kidney injury) ([place])   Intractable nausea, vomiting, diarrhea -Could be secondary to viral gastroenteritis, although symptoms have been going on for about a month -CT abdomen and pelvis revealed no acute findings, no bowel obstruction.  Positive for colonic diverticulosis without diverticulitis, small benign fatty mass next to the rectum -DG esophagus without fixed stricture, +esophageal dysmotility. Discussed with patient that she could follow up with GI as out patient   -No further vomiting or diarrhea since admission -Advance to soft diet today, has been tolerating full liquid diet without any vomiting   Klebsiella UTI -UA with many bacteria, trace leukocytes -Fosfomycin x 1 dose ordered today   Microcytic amenia -Compounded by hemodilution, no report of blood loss this admission -Monitor   AKI -Improved with IVF   DM type 2 -SSI  HTN -Lopressor  Chronic bilateral lower extremity lymphedema -Followed by wound clinic -Wound RN consulted     Discharge Instructions  Discharge Instructions    Call MD for:  difficulty breathing, headache or visual disturbances   Complete by:  As directed    Call MD for:  extreme fatigue   Complete by:  As directed    Call MD for:  hives   Complete by:  As directed    Call MD for:  persistant dizziness or light-headedness   Complete by:  As directed    Call MD for:  persistant nausea and vomiting   Complete by:  As directed  Call MD for:  severe uncontrolled pain   Complete by:  As directed    Call MD for:  temperature >100.4   Complete by:  As directed    Diet - low sodium heart healthy   Complete by:  As directed    Discharge instructions   Complete by:  As directed    You were cared for by a hospitalist during your hospital stay. If you have any questions  about your discharge medications or the care you received while you were in the hospital after you are discharged, you can call the unit and ask to speak with the hospitalist on call if the hospitalist that took care of you is not available. Once you are discharged, your primary care physician will handle any further medical issues. Please note that NO REFILLS for any discharge medications will be authorized once you are discharged, as it is imperative that you return to your primary care physician (or establish a relationship with a primary care physician if you do not have one) for your aftercare needs so that they can reassess your need for medications and monitor your lab values.   Increase activity slowly   Complete by:  As directed      Allergies as of 10/18/2017      Reactions   Oxycodone Hcl Nausea And Vomiting   Penicillins Itching   Has patient had a PCN reaction causing immediate rash, facial/tongue/throat swelling, SOB or lightheadedness with hypotension: No Has patient had a PCN reaction causing severe rash involving mucus membranes or skin necrosis: No Has patient had a PCN reaction that required hospitalization: No Has patient had a PCN reaction occurring within the last 10 years: No If all of the above answers are "NO", then may proceed with Cephalosporin use.      Medication List    TAKE these medications   acetaminophen 325 MG tablet Commonly known as:  TYLENOL Take 2 tablets (650 mg total) by mouth every 6 (six) hours as needed for mild pain (or Fever >/= 101).   aspirin EC 81 MG tablet Take 81 mg by mouth daily with breakfast. Takes once or twice weekly   [employer] ANTIBIOTIC [phone] Oint APPLY AFTER CLEANING BEFORE WRAPPING LGS   losartan 100 MG tablet Commonly known as:  COZAAR Take 1 tablet (100 mg total) by mouth daily.   metFORMIN 500 MG tablet Commonly known as:  GLUCOPHAGE TAKE 1 TABLET BY MOUTH DAILY WITH BREAKFAST.   methocarbamol 500 MG  tablet Commonly known as:  ROBAXIN Take 1 tablet (500 mg total) by mouth every 6 (six) hours as needed for muscle spasms.   metoprolol tartrate 50 MG tablet Commonly known as:  LOPRESSOR Take 1 tablet (50 mg total) by mouth 2 (two) times daily.   ondansetron 4 MG tablet Commonly known as:  ZOFRAN Take 1 tablet (4 mg total) by mouth every 8 (eight) hours as needed for nausea or vomiting.   promethazine 25 MG tablet Commonly known as:  PHENERGAN Take 1 tablet (25 mg total) by mouth every 8 (eight) hours as needed for nausea or vomiting.      Follow-up Information    Hoyt Koch, MD. Schedule an appointment as soon as possible for a visit in 1 week(s).   Specialty:  Internal Medicine Contact information: [place] [address] [phone]          Allergies  Allergen Reactions  . Oxycodone Hcl Nausea And Vomiting  . Penicillins  Itching    Has patient had a PCN reaction causing immediate rash, facial/tongue/throat swelling, SOB or lightheadedness with hypotension: No Has patient had a PCN reaction causing severe rash involving mucus membranes or skin necrosis: No Has patient had a PCN reaction that required hospitalization: No Has patient had a PCN reaction occurring within the last 10 years: No If all of the above answers are "NO", then may proceed with Cephalosporin use.    Consultations:  None    Procedures/Studies: Ct Abdomen Pelvis Wo Contrast  Result Date: 10/16/2017 CLINICAL DATA:  Nausea, vomiting, and diarrhea for the last month. Denies pain EXAM: CT ABDOMEN AND PELVIS WITHOUT CONTRAST TECHNIQUE: Multidetector CT imaging of the abdomen and pelvis was performed following the standard protocol without IV contrast. COMPARISON:  CT of the abdomen and pelvis on 12/09/2013 FINDINGS: Lower chest: The lung bases are unremarkable.  Heart size is normal. Hepatobiliary: No focal liver abnormality is seen. No radiopaque gallstones, biliary dilatation,  or pericholecystic inflammatory changes. Pancreas: Unremarkable. No pancreatic ductal dilatation or surrounding inflammatory changes. Spleen: Normal in size without focal abnormality. Adrenals/Urinary Tract: Adrenal glands are normal in appearance. A low-attenuation lesion in the UPPER pole region of the LEFT kidney measures 2.6 centimeters and appears stable. There is no hydronephrosis. No intrarenal calculi. Urinary bladder is unremarkable. Stomach/Bowel: The stomach and small bowel loops are normal in appearance. Numerous colonic diverticula without associated diverticulitis. A small fat containing lesion is identified adjacent to the LOWER sigmoid colon, similar in appearance to a lesion seen in the central abdomen on previous study. This may represent a focal area of fat necrosis or other benign lesion. Vascular/Lymphatic: No significant vascular findings are present. No enlarged abdominal or pelvic lymph nodes. Reproductive: The uterus is present. No adnexal mass. Significant streak artifact in the pelvis. Other: No free pelvic fluid.  Anterior abdominal wall Musculoskeletal: Degenerative changes are seen in the LOWER thoracic and lumbar spine. IMPRESSION: 1. No bowel obstruction. 2. Colonic diverticulosis without acute diverticulitis. 3. Stable appearance of LEFT renal cyst. 4. Small benign fatty mass adjacent to the rectum, likely representing an area of fat necrosis or appendagitis. Electronically Signed   By: Nolon Nations M.D.   On: 10/16/2017 16:24   Dg Chest 2 View  Result Date: 10/16/2017 CLINICAL DATA:  Generalized weakness.  Dehydration. EXAM: CHEST - 2 VIEW COMPARISON:  November 17, 2013 FINDINGS: The heart size and mediastinal contours are within normal limits. Both lungs are clear. The visualized skeletal structures are unremarkable. IMPRESSION: No active cardiopulmonary disease. Electronically Signed   By: Dorise Bullion III M.D   On: 10/16/2017 11:32   Ct Head Wo Contrast  Result Date:  10/16/2017 CLINICAL DATA:  Nausea, vomiting, and diarrhea for the last month. Also having Dizziness. Denies pain EXAM: CT HEAD WITHOUT CONTRAST TECHNIQUE: Contiguous axial images were obtained from the base of the skull through the vertex without intravenous contrast. COMPARISON:  None. FINDINGS: Brain: No evidence of acute infarction, hemorrhage, hydrocephalus, extra-axial collection or mass lesion/mass effect. Vascular: No hyperdense vessel or unexpected calcification. Skull: Normal. Negative for fracture or focal lesion. Sinuses/Orbits: Normal globes and orbits. Mild maxillary sinus mucosal thickening. Remaining visualized sinuses and mastoid air cells are clear. Other: None. IMPRESSION: 1. No intracranial abnormality. Electronically Signed   By: Lajean Manes M.D.   On: 10/16/2017 16:16   Ct Chest Wo Contrast  Result Date: 10/17/2017 CLINICAL DATA:  Patient mistakenly scanned. Previous day's chest radiograph had an indication of generalized weakness and dehydration.  EXAM: CT CHEST WITHOUT CONTRAST TECHNIQUE: Multidetector CT imaging of the chest was performed following the standard protocol without IV contrast. COMPARISON:  Chest radiograph, 10/16/2017.  Chest CT, 08/02/2013. FINDINGS: Cardiovascular: Heart normal in size and configuration. No pericardial effusion. Mild left and right coronary artery calcifications. Great vessels normal in caliber. No significant aortic atherosclerosis Mediastinum/Nodes: No enlarged mediastinal or axillary lymph nodes. Thyroid gland, trachea, and esophagus demonstrate no significant findings. Lungs/Pleura: Lungs are clear. No pleural effusion or pneumothorax. Upper Abdomen: No acute abnormality. Musculoskeletal: No fracture or acute finding. No osteoblastic or osteolytic lesions. IMPRESSION: 1. No acute findings.  Lungs are clear. Electronically Signed   By: Lajean Manes M.D.   On: 10/17/2017 15:42   Dg Esophagus  Result Date: 10/18/2017 CLINICAL DATA:  Nausea,  vomiting, difficulty swallowing EXAM: ESOPHOGRAM/BARIUM SWALLOW TECHNIQUE: Single contrast examination was performed using  thin barium. FLUOROSCOPY TIME:  Fluoroscopy Time:  2 minutes Radiation Exposure Index (if provided by the fluoroscopic device): Number of Acquired Spot Images: 0 COMPARISON:  None. FINDINGS: Fluoroscopic evaluation of swallowing demonstrates disruption of primary esophageal peristaltic waves. No visible fixed stricture. No reflux with the water siphon maneuver. The 13 mm barium tablet sits in the distal esophagus despite lack of visible stricture. This does not reproduce the patient's symptoms. IMPRESSION: No visible fixed stricture. Esophageal dysmotility. Electronically Signed   By: Rolm Baptise M.D.   On: 10/18/2017 13:42     Discharge Exam: Vitals:   10/18/17 0558 10/18/17 1048  BP: (!) 101/53 99/60  Pulse: 67 73  Resp: 18 18  Temp: 98.1 F (36.7 C) 98.2 F (36.8 C)  SpO2: 100% 95%    General: Pt is alert, awake, not in acute distress Cardiovascular: RRR, S1/S2 +, no rubs, no gallops Respiratory: CTA bilaterally, no wheezing, no rhonchi Abdominal: Soft, NT, ND, bowel sounds + Extremities: no edema, no cyanosis    The results of significant diagnostics from this hospitalization (including imaging, microbiology, ancillary and laboratory) are listed below for reference.     Microbiology: Recent Results (from the past 240 hour(s))  Urine culture     Status: Abnormal (Preliminary result)   Collection Time: 10/16/17  9:48 PM  Result Value Ref Range Status   Specimen Description URINE, RANDOM  Final   Special Requests   Final    NONE Performed at [facility], [address], [place], [place] [address]    Culture >=100,000 COLONIES/mL KLEBSIELLA PNEUMONIAE (A)  Final   Report Status PENDING  Incomplete     Labs: BNP (last 3 results) No results for input(s): BNP in the last 8760 hours. Basic Metabolic Panel: Recent Labs  Lab 10/15/17 0949  10/16/17 1105 10/16/17 1330 10/17/17 0402 10/18/17 0402  NA 133* 134*  --  135 137  K 4.9 4.7  --  4.7 4.6  CL 103 104  --  110 110  CO2 18* 18*  --  20* 21*  GLUCOSE 173* 194*  --  90 83  BUN 61* 58*  --  38* 21  CREATININE 2.20* 2.01*  --  1.33* 1.06*  CALCIUM 9.5 9.2  --  8.2* 8.3*  MG  --   --  2.4  --   --    Liver Function Tests: Recent Labs  Lab 10/15/17 0949 10/16/17 1105  AST 14 22  ALT 12 16  ALKPHOS 74 72  BILITOT 0.4 0.5  PROT 9.0* 8.6*  ALBUMIN 3.4* 2.9*   Recent Labs  Lab 10/15/17 [id number]  LIPASE 18.0   No results for input(s): AMMONIA in the last 168 hours. CBC: Recent Labs  Lab 10/15/17 0949 10/16/17 1105 10/17/17 0402 10/18/17 0402  WBC 10.7* 7.4 5.9 5.1  NEUTROABS  --  5.7  --   --   HGB 10.2* 9.9* 7.6* 8.0*  HCT 31.5* 32.6* 24.6* 26.9*  MCV 73.1* 76.0* 75.2* 76.9*  PLT 555.0* 469* 338 362   Cardiac Enzymes: No results for input(s): CKTOTAL, CKMB, CKMBINDEX, TROPONINI in the last 168 hours. BNP: Invalid input(s): POCBNP CBG: Recent Labs  Lab 10/17/17 1141 10/17/17 1653 10/17/17 2359 10/18/17 0811 10/18/17 1148  GLUCAP 86 72 115* 76 87   D-Dimer No results for input(s): DDIMER in the last 72 hours. Hgb A1c No results for input(s): HGBA1C in the last 72 hours. Lipid Profile No results for input(s): CHOL, HDL, LDLCALC, TRIG, CHOLHDL, LDLDIRECT in the last 72 hours. Thyroid function studies No results for input(s): TSH, T4TOTAL, T3FREE, THYROIDAB in the last 72 hours.  Invalid input(s): FREET3 Anemia work up No results for input(s): VITAMINB12, FOLATE, FERRITIN, TIBC, IRON, RETICCTPCT in the last 72 hours. Urinalysis    Component Value Date/Time   COLORURINE YELLOW 10/16/2017 1552   APPEARANCEUR HAZY (A) 10/16/2017 1552   LABSPEC 1.013 10/16/2017 1552   PHURINE 5.0 10/16/2017 1552   GLUCOSEU NEGATIVE 10/16/2017 1552   HGBUR SMALL (A) 10/16/2017 1552   BILIRUBINUR NEGATIVE 10/16/2017 1552   KETONESUR NEGATIVE 10/16/2017 1552    PROTEINUR NEGATIVE 10/16/2017 1552   UROBILINOGEN 1.0 12/09/2013 2303   NITRITE NEGATIVE 10/16/2017 1552   LEUKOCYTESUR TRACE (A) 10/16/2017 1552   Sepsis Labs Invalid input(s): PROCALCITONIN,  WBC,  LACTICIDVEN Microbiology Recent Results (from the past 240 hour(s))  Urine culture     Status: Abnormal (Preliminary result)   Collection Time: 10/16/17  9:48 PM  Result Value Ref Range Status   Specimen Description URINE, RANDOM  Final   Special Requests   Final    NONE Performed at [facility], [place] [address] [place], [place] [address]    Culture >=100,000 COLONIES/mL KLEBSIELLA PNEUMONIAE (A)  Final   Report Status PENDING  Incomplete     Patient was seen and examined on the day of discharge and was found to be in stable condition. Time coordinating discharge: 35 minutes including assessment and coordination of care, as well as examination of the patient.   SIGNED:  Dessa Phi, DO [facility] Pager [phone]  If 7PM-7AM, please contact night-coverage [email or web address] Password [email or web address] 10/18/2017, 3:52 PM

## 2017-10-18 NOTE — Consult Note (Signed)
   [facility] CM Inpatient Consult   10/18/2017  Chandrea Whack [birth year] [id number]  Patient is currently active with [facility] for chronic disease management services.  Patient has been engaged by a Publishing copy and CSW.  Our community based plan of care has focused on disease management and community resource support.   Met with patient at the bedside.  Patient was speaking with inpatient RNCM regarding disposition needs.  Patient states, "I am going home because I am trying to decrease some of the confusion. I want to stay as independent as possible."  Home Health was offered by inpatient and patient declined.  Spoke with patient regarding post hospital wound care needs.  Patient states, "I have had them done at the wound care center in the past and prefer to have it done there.  Patient has on bilateral Unna boots.  Updated inpatient RNCM, Manuela Schwartz on issues as well. Consent signed for ongoing [facility] services. Inpatient social worker in to work on transportation for returning home. Patient has [facility] for transportation.  Patient will receive a post discharge transition of care call and will be evaluated for monthly home visits for assessments and disease process education.  Of note, [facility] Care Management services does not replace or interfere with any services that are needed or arranged by inpatient case management or social work.  For additional questions or referrals please contact:  Natividad Brood, RN BSN [facility] Liaison  [phone] business mobile phone Toll free office [phone]

## 2017-10-18 NOTE — Care Management (Signed)
Case manager spoke with patient concerning discharge plan. Choice for Home Health agency offered, patient adamantly refused and says that she will go home and do what she always does. Case manager explained that Physical Therapy has suggested she go to SNF for shortterm rehab, patient definitely is not agreeable. She said she will call her grandson or someone to get her. Patient usually transports via [facility], the requirement is that they be contacted 24hrs prior to time needed so she cant use them this evening. Case manager has requested Social worker see if they will make exception for a hospital discharge. Patient is also speaking with Natividad Brood RN [facility] Nurse CM.

## 2017-10-18 NOTE — Clinical Social Work Note (Signed)
Patient medically stable for discharge today and transportation home needed. Ms. Jeancharles reported that she uses [facility] transportation and was transported by [facility] to her appointment on the day she was admitted to hospital. Call made to [facility] reservation line and they will pick patient up at 6 pm - main entrance [employer]) of hospital.  Nurse at the bedside and was made aware, along with patient. CSW signing off as no other SW intervention services needed at this time.  Doye Montilla Givens, MSW, LCSW Licensed Clinical Social Worker [place] [phone]

## 2017-10-18 NOTE — Progress Notes (Signed)
Orthopedic Tech Progress Note Patient Details:  [name] [name] [birth year] [id number]  Ortho Devices Type of Ortho Device: Haematologist Ortho Device/Splint Location: bilateral Ortho Device/Splint Interventions: Application   Post Interventions Patient Tolerated: Well Instructions Provided: Care of device   [name] 10/18/2017, 12:07 PM

## 2017-10-19 ENCOUNTER — Other Ambulatory Visit: Payer: Self-pay | Admitting: *Deleted

## 2017-10-19 ENCOUNTER — Telehealth: Payer: Self-pay | Admitting: *Deleted

## 2017-10-19 LAB — URINE CULTURE: Culture: >=100000 — AB

## 2017-10-19 NOTE — Telephone Encounter (Signed)
Pt was on TCM report. Called pt to make TCM hosp appt no answer LMOM RTC.Marland KitchenJohny Chess

## 2017-10-19 NOTE — Patient Outreach (Signed)
[facility]) Care Management  10/19/2017  Victorine Burditt 10-30-1945 [id number]   Referral received from hospital liaison as member was admitted 7/13-7/15, diagnoses with acute kidney injury and UTI.  Per chart, she also has history of venous stasis of lower extremity, hypertension, aortic stenosis, GERD, and diabetes.  Call placed to member for telephone assessment and schedule home visit, no answer.  HIPAA compliant voice message left.  Unsuccessful outreach letter sent, will follow up within the next 4 business days.   Valente David, [place], MSN [place] [phone]

## 2017-10-20 ENCOUNTER — Other Ambulatory Visit: Payer: Self-pay | Admitting: *Deleted

## 2017-10-20 ENCOUNTER — Ambulatory Visit: Payer: Self-pay | Admitting: *Deleted

## 2017-10-20 NOTE — Telephone Encounter (Signed)
Called pt to confirm appt that has been made for 10/22/17 for hosp f/u. Per pt yes call this am and made appt. Inform pt had some additional questions concerning discharge completed TCM questions below.[name] [name]  Transition Care Management Follow-up Telephone Call   Date discharged? 10/18/17   How have you been since you were released from the hospital? Pt states she is doing ok    Do you understand why you were in the hospital? YES   Do you understand the discharge instructions? YES   Where were you discharged to? Home   Items Reviewed:  Medications reviewed: YES  Allergies reviewed: YES  Dietary changes reviewed: YES  Referrals reviewed: No referral needed   Functional Questionnaire:   Activities of Daily Living (ADLs):   She states she are independent in the following: feeding, continence, grooming and toileting States they require assistance with the following: ambulation, bathing and hygiene and dressing   Any transportation issues/concerns?: NO   Any patient concerns? NO   Confirmed importance and date/time of follow-up visits scheduled YES, appt 10/22/17  Provider Appointment booked with Dr. [name]  Confirmed with patient if condition begins to worsen call PCP or go to the ER.  Patient was given the office number and encouraged to call back with question or concerns.  : YES

## 2017-10-20 NOTE — Patient Outreach (Signed)
[facility]) Care Management  10/20/2017  Shilynn Derise 17-Nov-1945 [id number]   RN Health Coach received  telephone call from patient.  Hipaa compliance verified. Patient called to make Health Coach aware of hospital stay. RN care coordinator had called patient and left message. Patient made aware that care is being turned over to Care Coordinator since discharged from hospital. The Care Coordinator will follow up with home visit.   Plan: RN notified Valente David care coordinator of patient call   [facility] [phone]

## 2017-10-20 NOTE — Patient Outreach (Signed)
[facility]) Care Management  10/20/2017  Tonae Holtsclaw 1945/09/26 [id number]   Voice message received back from member after unsuccessful outreach yesterday.  Call placed back to member, identity verified.  This care manger introduced self and purpose of call.  [facility] care management services explained.  She immediately expresses frustration regarding being discharged from hospital still not having a bowel movement in almost 3 weeks.  Feel they "didn't do anything."  State she has contacted MD office today and was told to take a laxative, which she is doing at this time.  She is also frustrated that she was told that she was given medication for UTI, but was never told that she had a UTI.  No antibiotics ordered on discharge.    Report she has had a hard time eating since discharge, has not had the appetite.  However state she was able to eat a meal last night.  She is also advised to drink plenty of water as per chart she was also dehydrated.  Denies urgent concerns, will follow up with Primary MD on Friday, transportation secured with [facility].  Agrees to home visit next week.  [facility] CM Care Plan Problem One     Most Recent Value  Care Plan Problem One  Risk for admission related to constipation as evidenced by recent hospital admission  Role Documenting the Problem One  Care Management [place] for Problem One  Active  [place] Term Goal   Member will not be readmitted to hospital within the next 31 days  [facility] Long Term Goal Start Date  10/20/17  Interventions for Problem One Long Term Goal  Discussed with member the importance of following discharge instructions, including follow up appointments, medications, diet, to decrease the risk of readmission  [facility]N CM Short Term Goal #1   Member will report having a bowel movement within the next week  [facility]N CM Short Term Goal #1 Start Date  10/20/17  Interventions for Short Term Goal #1  Reinforced advice from MD office today  regarding drinking prune juice and taking laxative  THN CM Short Term Goal #2   Member will report appointment with GI specialist within the next 2 weeks  [facility]N CM Short Term Goal #2 Start Date  10/20/17  Interventions for Short Term Goal #2  Advised of recommendation for GI specialist, encouraged to discuss with primary MD this week.     Valente David, [place], MSN [place] [phone]

## 2017-10-20 NOTE — Telephone Encounter (Signed)
noted 

## 2017-10-20 NOTE — Telephone Encounter (Signed)
Pt stated she has not had a bm in  3 weeks.  She was discharged for there hospital on Monday. She has a follow up appointment on Friday. She denies pain or nausea. Does not feel bloated or full. No fever. Has no appetite. She has not taken a laxative or used an enema. She does not want to go back to the hospital. Conferred with Sam (flow) at [facility] regarding disposition. Per protocol, appointment already scheduled for Friday. She has problems with transportation, so will wait until Friday to see her pcp. Home care advice given to patient and advised to call back for increase in symptoms: abd pain, vomiting or fever. Pt voiced understanding. She has agreed to take a laxative today.  Reason for Disposition . [1] Weight loss > 10 pounds (5 kg) AND [2] not dieting  Answer Assessment - Initial Assessment Questions 1. STOOL PATTERN OR FREQUENCY: "How often do you pass bowel movements (BMs)?"  (Normal range: tid to q 3 days)  "When was the last BM passed?"       Once or twice a week 2. STRAINING: "Do you have to strain to have a BM?"      No urge 3. RECTAL PAIN: "Does your rectum hurt when the stool comes out?" If so, ask: "Do you have hemorrhoids? How bad is the pain?"  (Scale 1-10; or mild, moderate, severe)     no 4. STOOL COMPOSITION: "Are the stools hard?"      No stool 5. BLOOD ON STOOLS: "Has there been any blood on the toilet tissue or on the surface of the BM?" If so, ask: "When was the last time?"      No stool 6. CHRONIC CONSTIPATION: "Is this a new problem for you?"  If no, ask: "How long have you had this problem?" (days, weeks, months)      New problem and now going on for 3 weeks 7. CHANGES IN DIET: "Have there been any recent changes in your diet?"      Yes, liquid diet while in the hospital  8. MEDICATIONS: "Have you been taking any new medications?"     Only new meds in the hospital 9. LAXATIVES: "Have you been using any laxatives or enemas?"  If yes, ask "What, how  often, and when was the last time?"     Have not tried laxatives and no enemas 10. CAUSE: "What do you think is causing the constipation?"        Not sure 11. OTHER SYMPTOMS: "Do you have any other symptoms?" (e.g., abdominal pain, fever, vomiting)       No appetite  Protocols used: CONSTIPATION-A-AH

## 2017-10-22 ENCOUNTER — Encounter: Payer: Self-pay | Admitting: Internal Medicine

## 2017-10-22 ENCOUNTER — Other Ambulatory Visit: Payer: PPO

## 2017-10-22 ENCOUNTER — Ambulatory Visit (INDEPENDENT_AMBULATORY_CARE_PROVIDER_SITE_OTHER): Payer: PPO | Admitting: Internal Medicine

## 2017-10-22 ENCOUNTER — Ambulatory Visit: Payer: PPO | Admitting: *Deleted

## 2017-10-22 VITALS — BP 118/60 | HR 70 | Temp 97.6°F | Ht 71.0 in | Wt 284.0 lb

## 2017-10-22 DIAGNOSIS — N179 Acute kidney failure, unspecified: Secondary | ICD-10-CM

## 2017-10-22 DIAGNOSIS — N3000 Acute cystitis without hematuria: Secondary | ICD-10-CM

## 2017-10-22 DIAGNOSIS — R112 Nausea with vomiting, unspecified: Secondary | ICD-10-CM

## 2017-10-22 LAB — POCT URINALYSIS DIPSTICK
Bilirubin, UA: NEGATIVE
Blood, UA: NEGATIVE
Glucose, UA: NEGATIVE
Ketones, UA: NEGATIVE
Leukocytes, UA: NEGATIVE
Nitrite, UA: NEGATIVE
Protein, UA: NEGATIVE
Spec Grav, UA: >=1.03 — AB (ref 1.010–1.025)
Urobilinogen, UA: 0.2 E.U./dL
pH, UA: 5.5 (ref 5.0–8.0)

## 2017-10-22 NOTE — Patient Instructions (Signed)
We will check the urine today and call you back about the results.   Call us back in a couple of weeks to let us know how you are doing.   If you are not able to eat or drink call us or come back sooner.

## 2017-10-22 NOTE — Assessment & Plan Note (Signed)
Resolved as she left the hospital. She denies poor intake. Will recheck in 1-2 weeks.

## 2017-10-22 NOTE — Assessment & Plan Note (Signed)
Needs U/A and culture today to rule out continuing infection.

## 2017-10-22 NOTE — Assessment & Plan Note (Signed)
Using zofran and phenergan but is not vomiting currently. She will need labs in 1-2 weeks if any recurrence.

## 2017-10-22 NOTE — Progress Notes (Signed)
   Subjective:    Patient ID: [name] [name], female    DOB: 16-Sep-1945, 72 y.o.   MRN: [id number]  HPI The patient is a 72 YO female coming in for hospital follow up (in for aki and nausea and vomiting, given fluids, CT abdomen and chest without abnormalities, barium swallow abnormal). She is eating some more since leaving the hospital. Using zofran and phenergan and not sure which is working. She denies throwing up. She had a small BM yesterday. She denies fevers or chills. Treated for UTI and did not have symptoms so is worried it is not gone. Took fosfomycin in the hospital right before leaving. She is still weak. Still poor appetite. She denies blood in stool. No abdominal pain or distention.   PMH, [facility], social history reviewed and updated.   Review of Systems  Constitutional: Positive for activity change, appetite change and fatigue. Negative for fever and unexpected weight change.  HENT: Negative.   Eyes: Negative.   Respiratory: Negative for cough, chest tightness and shortness of breath.   Cardiovascular: Negative for chest pain, palpitations and leg swelling.  Gastrointestinal: Positive for nausea. Negative for abdominal distention, abdominal pain, constipation, diarrhea and vomiting.  Musculoskeletal: Negative.   Skin: Negative.   Neurological: Negative.   Psychiatric/Behavioral: Negative.       Objective:   Physical Exam  Constitutional: She is oriented to person, place, and time. She appears well-developed and well-nourished.  HENT:  Head: Normocephalic and atraumatic.  Eyes: EOM are normal.  Neck: Normal range of motion.  Cardiovascular: Normal rate and regular rhythm.  Pulmonary/Chest: Effort normal and breath sounds normal. No respiratory distress. She has no wheezes. She has no rales.  Abdominal: Soft. Bowel sounds are normal. She exhibits no distension. There is no tenderness. There is no rebound.  Musculoskeletal: She exhibits edema.  Stable edema in her legs    Neurological: She is alert and oriented to person, place, and time. Coordination abnormal.  Wheeled walker  Skin: Skin is warm and dry.  Psychiatric: She has a normal mood and affect.   Vitals:   10/22/17 0851  BP: 118/60  Pulse: 70  Temp: 97.6 F (36.4 C)  TempSrc: Oral  SpO2: 99%  Weight: 284 lb (128.8 kg)  Height: 5\' 11"  (1.803 m)      Assessment & Plan:

## 2017-10-23 LAB — URINE CULTURE
MICRO NUMBER:: 90857520
SPECIMEN QUALITY:: ADEQUATE

## 2017-10-25 ENCOUNTER — Telehealth: Payer: Self-pay | Admitting: Internal Medicine

## 2017-10-25 ENCOUNTER — Other Ambulatory Visit: Payer: Self-pay | Admitting: Pharmacist

## 2017-10-25 NOTE — Telephone Encounter (Signed)
Pt returned call for lab results. Please call back. Ok to leave detailed msg per pt.  Copied from [place] [phone]. Topic: Quick Communication - Lab Results >> Oct 25, 2017  1:25 PM Lorrin Jackson, CMA wrote: Called patient to inform them of 10/22/17 lab results. When patient returns call, triage nurse may disclose results.

## 2017-10-25 NOTE — Patient Outreach (Signed)
[facility]) Care Management  10/25/2017   Dorla Urbas 1945/10/20 [id number]   Patient is a 72 year old female referred for 30 day post discharge medication review. PMHx includes but not limited to history ofSVT s/p ablation in 2015, T2DM, and HTN.  Subjective:  Patient is in pleasant spirits and is able to discuss her medication list. She notes concerns about still not having regular bowel movements. She endorses some mild itching since discharge, and isn't sure if could be related to her new medications (montelukast, promethazine, or ondansetron). She has stopped taking her montelukast to see if that improves her itching.   She also notes that she only takes aspirin 1-3 times a month.   Objective:   Current Medications:  Current Outpatient Medications  Medication Sig Dispense Refill  . acetaminophen (TYLENOL) 325 MG tablet Take 2 tablets (650 mg total) by mouth every 6 (six) hours as needed for mild pain (or Fever >/= 101).    Marland Kitchen losartan (COZAAR) 100 MG tablet Take 1 tablet (100 mg total) by mouth daily. 90 tablet 3  . metFORMIN (GLUCOPHAGE) 500 MG tablet TAKE 1 TABLET BY MOUTH DAILY WITH BREAKFAST. 90 tablet 3  . methocarbamol (ROBAXIN) 500 MG tablet Take 1 tablet (500 mg total) by mouth every 6 (six) hours as needed for muscle spasms. 30 tablet 0  . metoprolol tartrate (LOPRESSOR) 50 MG tablet Take 1 tablet (50 mg total) by mouth 2 (two) times daily. 180 tablet 1  . Neomycin-Bacitracin-Polymyxin ([employer] ANTIBIOTIC) [phone] OINT APPLY AFTER CLEANING BEFORE WRAPPING LGS 453.9 g 0  . ondansetron (ZOFRAN) 4 MG tablet Take 1 tablet (4 mg total) by mouth every 8 (eight) hours as needed for nausea or vomiting. 14 tablet 0  . aspirin EC 81 MG tablet Take 81 mg by mouth daily with breakfast. Takes once or twice weekly    . promethazine (PHENERGAN) 25 MG tablet Take 1 tablet (25 mg total) by mouth every 8 (eight) hours as needed for nausea or vomiting. (Patient not  taking: Reported on 10/25/2017) 30 tablet 0   No current facility-administered medications for this visit.     Functional Status:  In your present state of health, do you have any difficulty performing the following activities: 10/16/2017 08/02/2017  Hearing? N N  Vision? N N  Difficulty concentrating or making decisions? N N  Walking or climbing stairs? Y Y  Dressing or bathing? N N  Doing errands, shopping? N N  Preparing Food and eating ? - N  Using the Toilet? - N  In the past six months, have you accidently leaked urine? - N  Do you have problems with loss of bowel control? - N  Managing your Medications? - N  Managing your Finances? - N  Housekeeping or managing your Housekeeping? - N  Some recent data might be hidden    Fall/Depression Screening: Fall Risk  10/11/2017 09/10/2017 08/02/2017  Falls in the past year? Yes Yes Yes  Number falls in past yr: 1 1 1   Injury with Fall? No No No  Risk Factor Category  - - -  Comment - - -  Risk for fall due to : History of fall(s);Impaired balance/gait;Impaired mobility History of fall(s);Impaired balance/gait;Impaired mobility History of fall(s);Impaired balance/gait;Impaired mobility  Risk for fall due to: Comment - - -  Follow up - Falls evaluation completed;Falls prevention discussed;Education provided Falls evaluation completed;Falls prevention discussed;Education provided   [facility] 2/9 Scores 10/11/2017 09/10/2017 08/02/2017 06/02/2017 05/20/2017 05/11/2017 04/12/2017  PHQ -  2 Score 0 0 0 0 0 0 0  PHQ- 9 Score - - - - - 3 -   Assessment:   ASSESSMENT: Date Discharged from Hospital: 10/18/2017 Date Medication Reconciliation Performed: 10/25/2017   No new medications were prescribed at discharge.  Patient was recently discharged from hospital and all medications have been reviewed  Drugs sorted by system:  Cardiovascular: aspirin 81 mg, losartan 100 mg daily, metoprolol tartrate 50 mg BID  Pulmonary/Allergy:   Gastrointestinal:  ondansetron 4 mg PRN nausea, promethazine 25 mg PRN nausea  Endocrine: metformin 500 mg daily  Topical: neomycin-bacitracin-polymyxin   Pain: acetaminophen 325 mg PRN pain, methocarbamol 500 mg PRN pain,   PLAN: -Reminded patient to take all medications as prescribed -Will route visit note to PCP Dr. Tami Ribas, PharmD PGY2 Ambulatory [facility] Pharmacy Resident Phone: [phone]

## 2017-10-26 ENCOUNTER — Other Ambulatory Visit: Payer: Self-pay | Admitting: *Deleted

## 2017-10-26 NOTE — Patient Outreach (Signed)
[facility]) Care Management  10/26/2017  [name] [name] [birth year] [id number]   Call placed to member to confirm she was available for scheduled home visit, no answer. HIPAA compliant voice message left. Proceeded with scheduled visit, arrived at [facility] home however she state she is just getting back in and is not up for visitors at this time.  Request for this care manager to come back another day.  Will follow up with member within the next 3 business days to reschedule visit.  Valente David, [place], MSN [place] [phone]

## 2017-10-26 NOTE — Telephone Encounter (Signed)
See result note.  Patient was given information

## 2017-10-28 ENCOUNTER — Other Ambulatory Visit: Payer: Self-pay | Admitting: *Deleted

## 2017-10-28 ENCOUNTER — Telehealth: Payer: Self-pay | Admitting: Internal Medicine

## 2017-10-28 DIAGNOSIS — Z6837 Body mass index (BMI) 37.0-37.9, adult: Secondary | ICD-10-CM | POA: Diagnosis not present

## 2017-10-28 DIAGNOSIS — K224 Dyskinesia of esophagus: Secondary | ICD-10-CM | POA: Diagnosis not present

## 2017-10-28 DIAGNOSIS — D631 Anemia in chronic kidney disease: Secondary | ICD-10-CM | POA: Diagnosis not present

## 2017-10-28 DIAGNOSIS — N183 Chronic kidney disease, stage 3 (moderate): Secondary | ICD-10-CM | POA: Diagnosis not present

## 2017-10-28 DIAGNOSIS — L97219 Non-pressure chronic ulcer of right calf with unspecified severity: Secondary | ICD-10-CM | POA: Diagnosis not present

## 2017-10-28 DIAGNOSIS — K219 Gastro-esophageal reflux disease without esophagitis: Secondary | ICD-10-CM | POA: Diagnosis not present

## 2017-10-28 DIAGNOSIS — L03116 Cellulitis of left lower limb: Secondary | ICD-10-CM | POA: Diagnosis not present

## 2017-10-28 DIAGNOSIS — M19049 Primary osteoarthritis, unspecified hand: Secondary | ICD-10-CM | POA: Diagnosis not present

## 2017-10-28 DIAGNOSIS — I110 Hypertensive heart disease with heart failure: Secondary | ICD-10-CM | POA: Diagnosis not present

## 2017-10-28 DIAGNOSIS — Z7984 Long term (current) use of oral hypoglycemic drugs: Secondary | ICD-10-CM | POA: Diagnosis not present

## 2017-10-28 DIAGNOSIS — Z4800 Encounter for change or removal of nonsurgical wound dressing: Secondary | ICD-10-CM | POA: Diagnosis not present

## 2017-10-28 DIAGNOSIS — Z7982 Long term (current) use of aspirin: Secondary | ICD-10-CM | POA: Diagnosis not present

## 2017-10-28 DIAGNOSIS — I5030 Unspecified diastolic (congestive) heart failure: Secondary | ICD-10-CM | POA: Diagnosis not present

## 2017-10-28 DIAGNOSIS — I872 Venous insufficiency (chronic) (peripheral): Secondary | ICD-10-CM | POA: Diagnosis not present

## 2017-10-28 DIAGNOSIS — Z87891 Personal history of nicotine dependence: Secondary | ICD-10-CM | POA: Diagnosis not present

## 2017-10-28 DIAGNOSIS — M170 Bilateral primary osteoarthritis of knee: Secondary | ICD-10-CM | POA: Diagnosis not present

## 2017-10-28 DIAGNOSIS — M479 Spondylosis, unspecified: Secondary | ICD-10-CM | POA: Diagnosis not present

## 2017-10-28 DIAGNOSIS — I350 Nonrheumatic aortic (valve) stenosis: Secondary | ICD-10-CM | POA: Diagnosis not present

## 2017-10-28 DIAGNOSIS — I471 Supraventricular tachycardia: Secondary | ICD-10-CM | POA: Diagnosis not present

## 2017-10-28 DIAGNOSIS — L97229 Non-pressure chronic ulcer of left calf with unspecified severity: Secondary | ICD-10-CM | POA: Diagnosis not present

## 2017-10-28 DIAGNOSIS — L989 Disorder of the skin and subcutaneous tissue, unspecified: Secondary | ICD-10-CM | POA: Diagnosis not present

## 2017-10-28 DIAGNOSIS — I2510 Atherosclerotic heart disease of native coronary artery without angina pectoris: Secondary | ICD-10-CM | POA: Diagnosis not present

## 2017-10-28 DIAGNOSIS — D509 Iron deficiency anemia, unspecified: Secondary | ICD-10-CM | POA: Diagnosis not present

## 2017-10-28 DIAGNOSIS — I130 Hypertensive heart and chronic kidney disease with heart failure and stage 1 through stage 4 chronic kidney disease, or unspecified chronic kidney disease: Secondary | ICD-10-CM | POA: Diagnosis not present

## 2017-10-28 DIAGNOSIS — E1122 Type 2 diabetes mellitus with diabetic chronic kidney disease: Secondary | ICD-10-CM | POA: Diagnosis not present

## 2017-10-28 DIAGNOSIS — I890 Lymphedema, not elsewhere classified: Secondary | ICD-10-CM | POA: Diagnosis not present

## 2017-10-28 DIAGNOSIS — D638 Anemia in other chronic diseases classified elsewhere: Secondary | ICD-10-CM | POA: Diagnosis not present

## 2017-10-28 DIAGNOSIS — E119 Type 2 diabetes mellitus without complications: Secondary | ICD-10-CM | POA: Diagnosis not present

## 2017-10-28 NOTE — Telephone Encounter (Signed)
Copied from [place] [phone]. Topic: Quick Communication - See Telephone Encounter >> Oct 28, 2017  3:18 PM Vernona Rieger wrote: CRM for notification. See Telephone encounter for: 10/28/17.  Morey Hummingbird, RN with [facility] stated the patient was discharged from the hospital on 7/15 and had her follow up appointment on 7/19. She would like to go out and see the patient for [facility] for her lower extremities for swelling, wound care and to change it weekly. She would like to go once this week, twice next week and once a week for three weeks. Her call back is [phone]

## 2017-10-28 NOTE — Telephone Encounter (Signed)
Fine

## 2017-10-28 NOTE — Telephone Encounter (Signed)
LVM giving verbals 

## 2017-10-28 NOTE — Patient Outreach (Signed)
[facility]) Care Management  10/28/2017  [name] [name] 11/28/1945 [id number]   Call placed to member to follow up on missed home visit.  She report she was seen by her primary MD on 7/19, voiced ongoing concern regarding decreased appetite, will discuss with her again as she is not happy with MD's response.  She denies any urgent concerns at this time, state home health nurse is in the home to place wraps on her legs (noted order for una boots).  Agree to home visit next week, but would like call before appointment time to again confirm.   Valente David, [place], MSN [place] [phone]

## 2017-10-29 DIAGNOSIS — K224 Dyskinesia of esophagus: Secondary | ICD-10-CM | POA: Diagnosis not present

## 2017-10-29 DIAGNOSIS — I110 Hypertensive heart disease with heart failure: Secondary | ICD-10-CM | POA: Diagnosis not present

## 2017-10-29 DIAGNOSIS — K219 Gastro-esophageal reflux disease without esophagitis: Secondary | ICD-10-CM | POA: Diagnosis not present

## 2017-10-29 DIAGNOSIS — I5030 Unspecified diastolic (congestive) heart failure: Secondary | ICD-10-CM | POA: Diagnosis not present

## 2017-10-29 DIAGNOSIS — E119 Type 2 diabetes mellitus without complications: Secondary | ICD-10-CM | POA: Diagnosis not present

## 2017-10-29 DIAGNOSIS — I2510 Atherosclerotic heart disease of native coronary artery without angina pectoris: Secondary | ICD-10-CM | POA: Diagnosis not present

## 2017-10-29 DIAGNOSIS — Z4800 Encounter for change or removal of nonsurgical wound dressing: Secondary | ICD-10-CM | POA: Diagnosis not present

## 2017-11-04 ENCOUNTER — Other Ambulatory Visit: Payer: Self-pay | Admitting: *Deleted

## 2017-11-04 NOTE — Patient Outreach (Signed)
[facility]) Care Management  11/04/2017  [name] [name] [birth year] [id number]   Call placed to member to confirm she would be available for home visit this morning.  No answer with outreach attempt, did receive call back from member granting permission to proceed with visit.    Arrived at [facility] home she immediately begins to express frustration regarding multiple people from various services calling to follow up.  State "all this should've been done while I was in the hospital. Now everybody trying to rush me to do stuff."  She has home health involvement for nursing and PT, has had follow up with primary MD since discharge but still have the same complaints of decreased urine output, decreased appetite, and nausea.  She has not been taking medications consistently since discharge for this reason.    She questions "what are you going to do for me?" referring to complaints.  Primary MD note reviewed, advised member of need to call to schedule follow up due to no improvement.  She remains upset and frustrated stating "they not going to do anything and I have enough common sense to know when I really need to go."  She again asks "What can YOU do for me?  Can you help me eat? Can you help me go to the bathroom?"  This care manager explained role of [facility] care management services multiple times, offered to contact MD office for member in effort to address ongoing issues and request appointment for further evaluation, she declines.  State "you can't do anything for me right now.  I'll call you when you can help."    This care manager again attempted to explain benefits of services, she remains frustrated and upset, stating "I have sense.  I know when to call the doctor when I need to."  Provided member with contact information for this care manager.  Will close case at this time due to withdrawal from program.  Will notify primary MD.  Valente David, RN, MSN [place] [phone]

## 2017-11-05 ENCOUNTER — Ambulatory Visit: Payer: Self-pay | Admitting: Internal Medicine

## 2017-11-05 NOTE — Telephone Encounter (Signed)
Pt c/o severe nausea. Pt stated that the inside of her mouth is dry. Pt called it "white mouth." Pt has not voided since yesterday. Pt denies vomiting. During call pt had several gagging episodes. Pt denies abdominal pain. Pt stated that she is drinking some po fluids. Pt c/o being very weak. Pt stated that her nausea began mid July. Pt stated that she is unable to eat any solids. Pt stated that she has not had a bowel movement since last week. Pt was hospitalized July 13th for dehydration. Based on signs of dehydration and weakness, advised pt to go to ED. Pt stated that she has no one who can take her. Advised pt to call EMS for transportation and pt stated that she will go to the hospital "tomorrow." Routing note to PCP.   Reason for Disposition . [1] Drinking very little AND [2] dehydration suspected (e.g., no urine > 12 hours, very dry mouth, very lightheaded) . Patient sounds very sick or weak to the triager    Pt stated that she is weak.  Answer Assessment - Initial Assessment Questions 1. NAUSEA SEVERITY: "How bad is the nausea?" (e.g., mild, moderate, severe; dehydration, weight loss)   - MILD: loss of appetite without change in eating habits   - MODERATE: decreased oral intake without significant weight loss, dehydration, or malnutrition   - SEVERE: inadequate caloric or fluid intake, significant weight loss, symptoms of dehydration     severe 2. ONSET: "When did the nausea begin?"    Mid July 3. VOMITING: "Any vomiting?" If so, ask: "How many times today?"     no 4. RECURRENT SYMPTOM: "Have you had nausea before?" If so, ask: "When was the last time?" "What happened that time?"     Yes was hospitalized for dehydration 5. CAUSE: "What do you think is causing the nausea?"     Pt doesn't know 6. PREGNANCY: "Is there any chance you are pregnant?" (e.g., unprotected intercourse, missed birth control pill, broken condom)     n/a  Protocols used: NAUSEA-A-AH

## 2017-11-05 NOTE — Telephone Encounter (Signed)
Called patient and stated that she is able to keep water and ginger ale down and has been keeping apple sauce down but still feels weak and nauseous and will go to the ED in the morning. I informed patient if it gets worse to call EMS patient stated understanding

## 2017-11-06 ENCOUNTER — Observation Stay (HOSPITAL_COMMUNITY): Payer: PPO

## 2017-11-06 ENCOUNTER — Inpatient Hospital Stay (HOSPITAL_COMMUNITY)
Admission: EM | Admit: 2017-11-06 | Discharge: 2017-11-09 | DRG: 683 | Disposition: A | Discharge status: Home Health | Payer: PPO | Attending: Internal Medicine | Admitting: Internal Medicine

## 2017-11-06 ENCOUNTER — Encounter (HOSPITAL_COMMUNITY): Payer: Self-pay

## 2017-11-06 ENCOUNTER — Other Ambulatory Visit: Payer: Self-pay

## 2017-11-06 DIAGNOSIS — E669 Obesity, unspecified: Secondary | ICD-10-CM

## 2017-11-06 DIAGNOSIS — E861 Hypovolemia: Secondary | ICD-10-CM | POA: Diagnosis present

## 2017-11-06 DIAGNOSIS — I10 Essential (primary) hypertension: Secondary | ICD-10-CM | POA: Diagnosis not present

## 2017-11-06 DIAGNOSIS — D75839 Thrombocytosis, unspecified: Secondary | ICD-10-CM

## 2017-11-06 DIAGNOSIS — I890 Lymphedema, not elsewhere classified: Secondary | ICD-10-CM | POA: Diagnosis not present

## 2017-11-06 DIAGNOSIS — E119 Type 2 diabetes mellitus without complications: Secondary | ICD-10-CM | POA: Diagnosis not present

## 2017-11-06 DIAGNOSIS — I5032 Chronic diastolic (congestive) heart failure: Secondary | ICD-10-CM | POA: Diagnosis present

## 2017-11-06 DIAGNOSIS — E1122 Type 2 diabetes mellitus with diabetic chronic kidney disease: Secondary | ICD-10-CM | POA: Diagnosis not present

## 2017-11-06 DIAGNOSIS — L97929 Non-pressure chronic ulcer of unspecified part of left lower leg with unspecified severity: Secondary | ICD-10-CM | POA: Diagnosis present

## 2017-11-06 DIAGNOSIS — E872 Acidosis, unspecified: Secondary | ICD-10-CM

## 2017-11-06 DIAGNOSIS — R531 Weakness: Secondary | ICD-10-CM | POA: Diagnosis not present

## 2017-11-06 DIAGNOSIS — Z87891 Personal history of nicotine dependence: Secondary | ICD-10-CM | POA: Diagnosis not present

## 2017-11-06 DIAGNOSIS — R627 Adult failure to thrive: Secondary | ICD-10-CM | POA: Diagnosis present

## 2017-11-06 DIAGNOSIS — I878 Other specified disorders of veins: Secondary | ICD-10-CM | POA: Diagnosis present

## 2017-11-06 DIAGNOSIS — I130 Hypertensive heart and chronic kidney disease with heart failure and stage 1 through stage 4 chronic kidney disease, or unspecified chronic kidney disease: Secondary | ICD-10-CM | POA: Diagnosis not present

## 2017-11-06 DIAGNOSIS — K219 Gastro-esophageal reflux disease without esophagitis: Secondary | ICD-10-CM | POA: Diagnosis present

## 2017-11-06 DIAGNOSIS — Z833 Family history of diabetes mellitus: Secondary | ICD-10-CM

## 2017-11-06 DIAGNOSIS — Z7984 Long term (current) use of oral hypoglycemic drugs: Secondary | ICD-10-CM | POA: Diagnosis not present

## 2017-11-06 DIAGNOSIS — N179 Acute kidney failure, unspecified: Principal | ICD-10-CM | POA: Diagnosis present

## 2017-11-06 DIAGNOSIS — Z6836 Body mass index (BMI) 36.0-36.9, adult: Secondary | ICD-10-CM | POA: Diagnosis not present

## 2017-11-06 DIAGNOSIS — N183 Chronic kidney disease, stage 3 unspecified: Secondary | ICD-10-CM

## 2017-11-06 DIAGNOSIS — K5900 Constipation, unspecified: Secondary | ICD-10-CM | POA: Diagnosis present

## 2017-11-06 DIAGNOSIS — Z7982 Long term (current) use of aspirin: Secondary | ICD-10-CM

## 2017-11-06 DIAGNOSIS — E875 Hyperkalemia: Secondary | ICD-10-CM | POA: Diagnosis not present

## 2017-11-06 DIAGNOSIS — D473 Essential (hemorrhagic) thrombocythemia: Secondary | ICD-10-CM | POA: Diagnosis not present

## 2017-11-06 DIAGNOSIS — L03116 Cellulitis of left lower limb: Secondary | ICD-10-CM | POA: Diagnosis present

## 2017-11-06 DIAGNOSIS — I350 Nonrheumatic aortic (valve) stenosis: Secondary | ICD-10-CM | POA: Diagnosis present

## 2017-11-06 DIAGNOSIS — D509 Iron deficiency anemia, unspecified: Secondary | ICD-10-CM | POA: Diagnosis present

## 2017-11-06 DIAGNOSIS — R5383 Other fatigue: Secondary | ICD-10-CM | POA: Diagnosis not present

## 2017-11-06 DIAGNOSIS — D649 Anemia, unspecified: Secondary | ICD-10-CM

## 2017-11-06 LAB — COMPREHENSIVE METABOLIC PANEL
ALT: 12 U/L (ref 0–44)
AST: 23 U/L (ref 15–41)
Albumin: 3.1 g/dL — ABNORMAL LOW (ref 3.5–5.0)
Alkaline Phosphatase: 73 U/L (ref 38–126)
Anion gap: 13 (ref 5–15)
BUN: 58 mg/dL — ABNORMAL HIGH (ref 8–23)
CO2: 17 mmol/L — ABNORMAL LOW (ref 22–32)
Calcium: 9.1 mg/dL (ref 8.9–10.3)
Chloride: 103 mmol/L (ref 98–111)
Creatinine, Ser: 2.6 mg/dL — ABNORMAL HIGH (ref 0.44–1.00)
GFR calc Af Amer: 20 mL/min — ABNORMAL LOW (ref >60)
GFR calc non Af Amer: 17 mL/min — ABNORMAL LOW (ref >60)
Glucose, Bld: 122 mg/dL — ABNORMAL HIGH (ref 70–99)
Potassium: 5.5 mmol/L — ABNORMAL HIGH (ref 3.5–5.1)
Sodium: 133 mmol/L — ABNORMAL LOW (ref 135–145)
Total Bilirubin: 0.5 mg/dL (ref 0.3–1.2)
Total Protein: 8.7 g/dL — ABNORMAL HIGH (ref 6.5–8.1)

## 2017-11-06 LAB — URINALYSIS, ROUTINE W REFLEX MICROSCOPIC
Bilirubin Urine: NEGATIVE
Glucose, UA: NEGATIVE mg/dL
Hgb urine dipstick: NEGATIVE
Ketones, ur: NEGATIVE mg/dL
Leukocytes, UA: NEGATIVE
Nitrite: NEGATIVE
Protein, ur: NEGATIVE mg/dL
Specific Gravity, Urine: 1.019 (ref 1.005–1.030)
pH: 5 (ref 5.0–8.0)

## 2017-11-06 LAB — CBC WITH DIFFERENTIAL/PLATELET
Basophils Absolute: 0.1 10*3/uL (ref 0.0–0.1)
Basophils Relative: 1 %
Eosinophils Absolute: 0 10*3/uL (ref 0.0–0.7)
Eosinophils Relative: 0 %
HCT: 31.2 % — ABNORMAL LOW (ref 36.0–46.0)
Hemoglobin: 9.8 g/dL — ABNORMAL LOW (ref 12.0–15.0)
Lymphocytes Relative: 14 %
Lymphs Abs: 1.2 10*3/uL (ref 0.7–4.0)
MCH: 23.8 pg — ABNORMAL LOW (ref 26.0–34.0)
MCHC: 31.4 g/dL (ref 30.0–36.0)
MCV: 75.7 fL — ABNORMAL LOW (ref 78.0–100.0)
Monocytes Absolute: 0.5 10*3/uL (ref 0.1–1.0)
Monocytes Relative: 6 %
Neutro Abs: 6.7 10*3/uL (ref 1.7–7.7)
Neutrophils Relative %: 79 %
Platelets: 528 10*3/uL — ABNORMAL HIGH (ref 150–400)
RBC: 4.12 MIL/uL (ref 3.87–5.11)
RDW: 18.6 % — ABNORMAL HIGH (ref 11.5–15.5)
WBC: 8.5 10*3/uL (ref 4.0–10.5)

## 2017-11-06 LAB — LIPASE, BLOOD: Lipase: 28 U/L (ref 11–51)

## 2017-11-06 IMAGING — DX DG ABDOMEN 1V
2 series · 2 of 2 positions shown · non-contrast
Comparison: Abdominal CT [DATE]

CLINICAL DATA: Constipation.

EXAM:
ABDOMEN - 1 VIEW

[abdomen kub (1 of 2)]
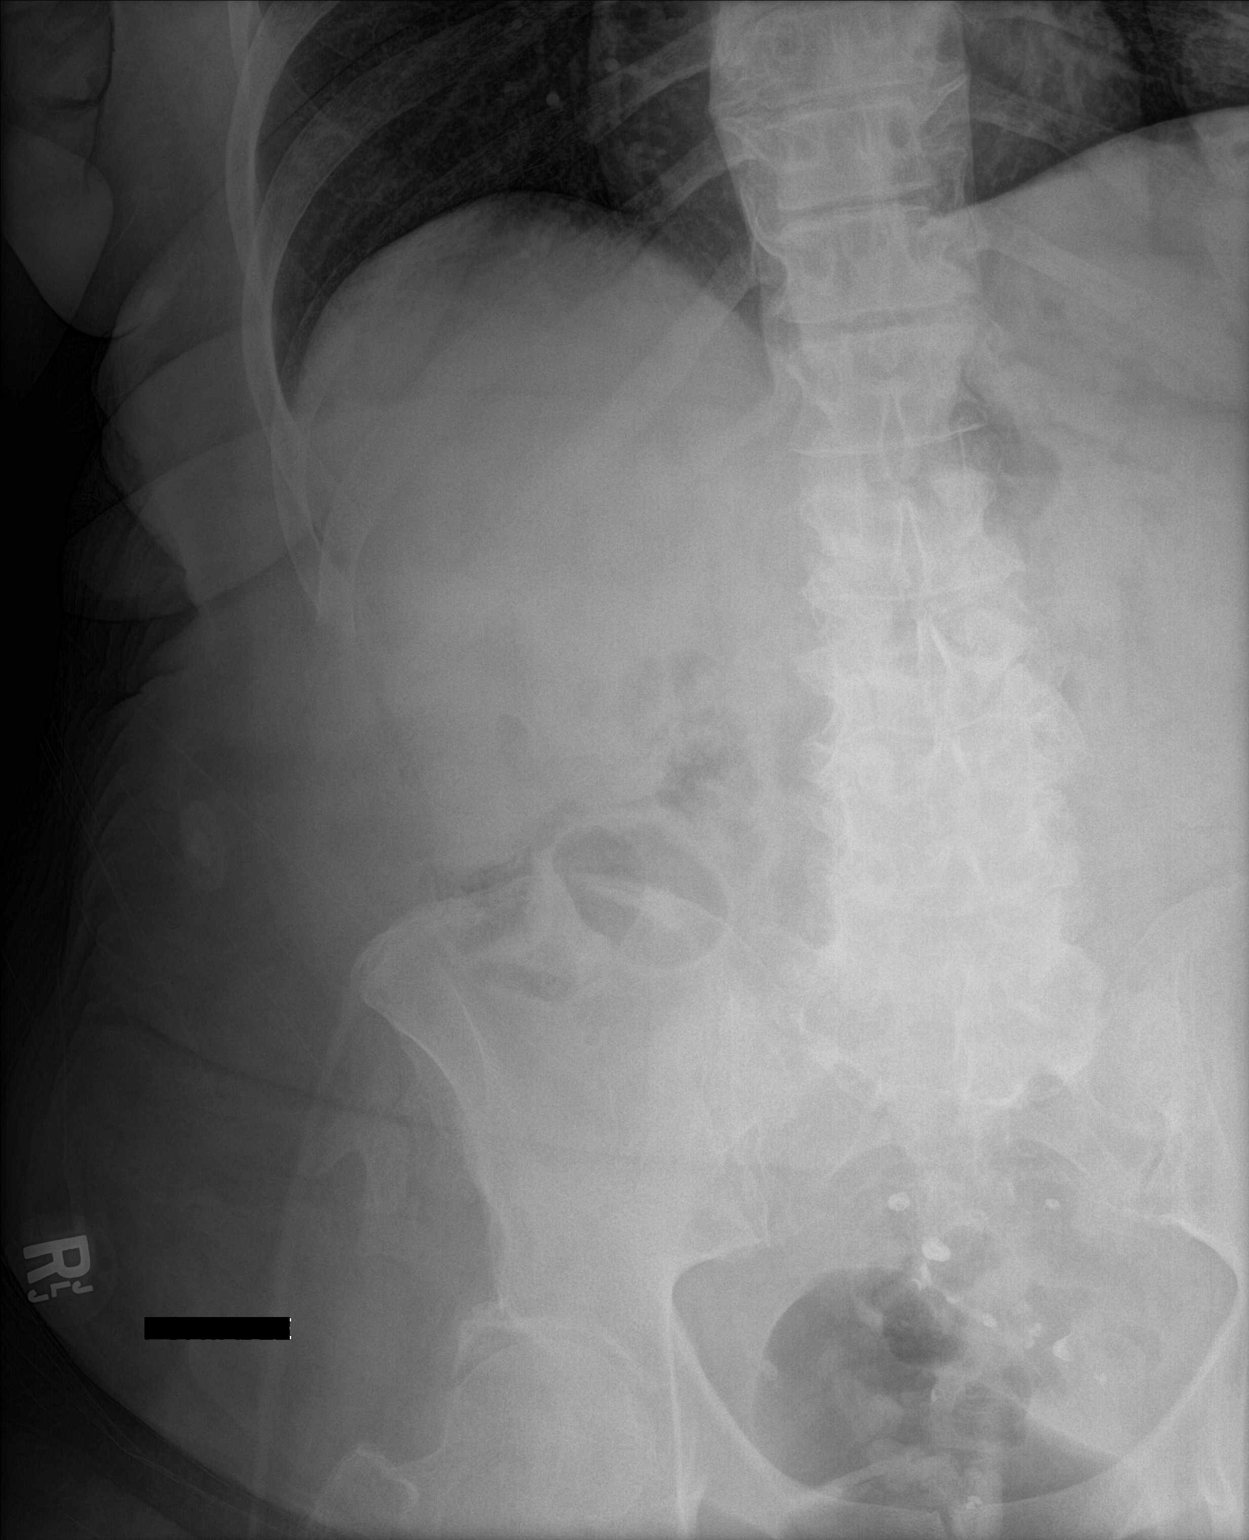

[abdomen kub (2 of 2)]
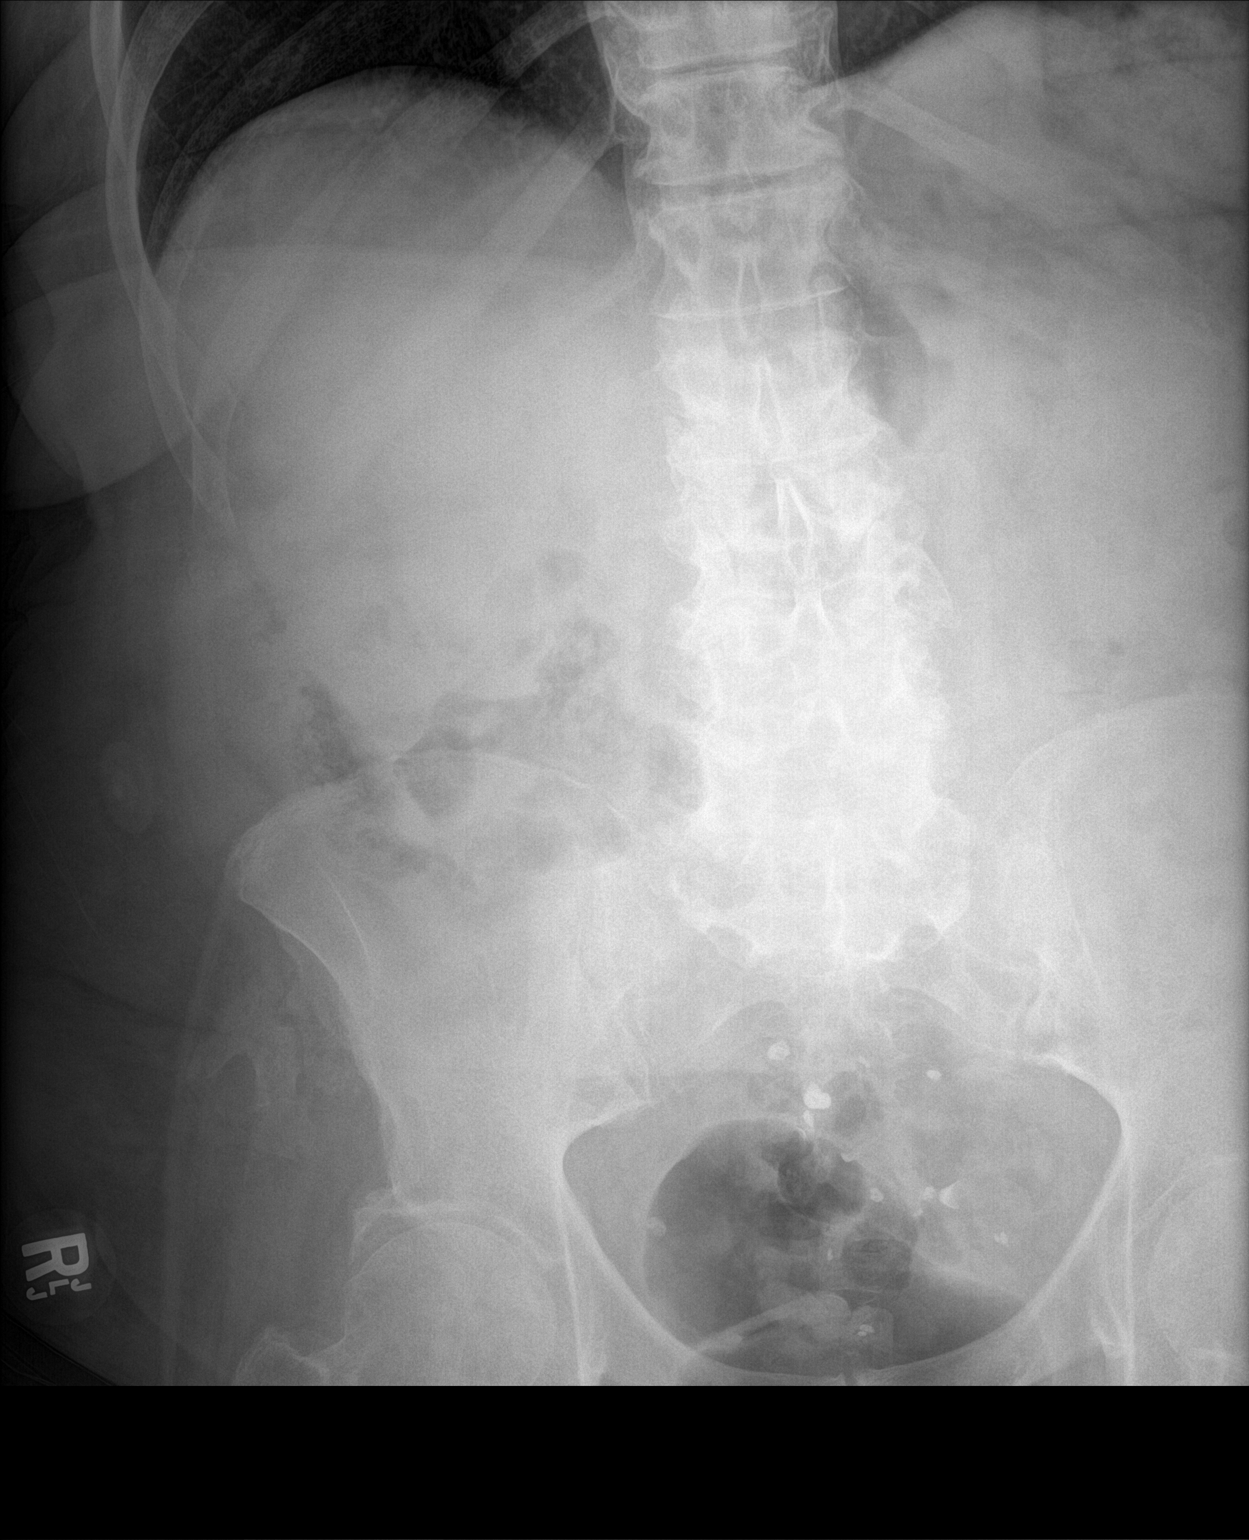

[2 of 2 positions shown; findings below may reference images not displayed]

FINDINGS: Left lateral abdomen excluded from the field of view on both images
submitted. Small volume of stool in the ascending colon without
increased stool burden to suggest constipation. Air-filled rectum
and distal sigmoid colon with small volume of stool. Hyperdensities
in the pelvis may represent combination of retained barium from
prior radiologic studies within colonic diverticula in phleboliths.
No evidence of free air. No bowel dilatation to suggest obstruction.
Degenerative change in the left hip.
IMPRESSION: Small volume of colonic stool. No increased stool burden to suggest
constipation. No bowel obstruction.

## 2017-11-06 MED ORDER — LACTATED RINGERS IV BOLUS
1000.0000 mL | Freq: Once | INTRAVENOUS | Status: AC
  Administered 2017-11-06: 1000 mL via INTRAVENOUS

## 2017-11-06 MED ORDER — HEPARIN SODIUM (PORCINE) 5000 UNIT/ML IJ SOLN
5000.0000 [IU] | Freq: Three times a day (TID) | INTRAMUSCULAR | Status: DC
  Administered 2017-11-06 – 2017-11-09 (×8): 5000 [IU] via SUBCUTANEOUS
  Filled 2017-11-06 (×8): qty 1

## 2017-11-06 MED ORDER — ACETAMINOPHEN 650 MG RE SUPP: 650.0000 mg | Freq: Four times a day (QID) | RECTAL | Status: DC | PRN

## 2017-11-06 MED ORDER — METOCLOPRAMIDE HCL 5 MG/ML IJ SOLN
10.0000 mg | Freq: Once | INTRAMUSCULAR | Status: AC
  Administered 2017-11-06: 10 mg via INTRAVENOUS
  Filled 2017-11-06: qty 2

## 2017-11-06 MED ORDER — ONDANSETRON HCL 4 MG PO TABS: 4.0000 mg | ORAL_TABLET | Freq: Four times a day (QID) | ORAL | Status: DC | PRN

## 2017-11-06 MED ORDER — LACTATED RINGERS IV SOLN
INTRAVENOUS | Status: AC
  Administered 2017-11-06: 20:00:00 via INTRAVENOUS
  Administered 2017-11-06: 125 mL/h via INTRAVENOUS
  Administered 2017-11-07: 04:00:00 via INTRAVENOUS

## 2017-11-06 MED ORDER — ASPIRIN EC 81 MG PO TBEC
81.0000 mg | DELAYED_RELEASE_TABLET | Freq: Every day | ORAL | Status: DC
  Administered 2017-11-07 – 2017-11-09 (×3): 81 mg via ORAL
  Filled 2017-11-06 (×3): qty 1

## 2017-11-06 MED ORDER — METOPROLOL TARTRATE 50 MG PO TABS
50.0000 mg | ORAL_TABLET | Freq: Two times a day (BID) | ORAL | Status: DC
  Administered 2017-11-06 – 2017-11-09 (×5): 50 mg via ORAL
  Filled 2017-11-06 (×6): qty 1

## 2017-11-06 MED ORDER — ONDANSETRON HCL 4 MG/2ML IJ SOLN: 4.0000 mg | Freq: Four times a day (QID) | INTRAMUSCULAR | Status: DC | PRN

## 2017-11-06 MED ORDER — ACETAMINOPHEN 325 MG PO TABS
650.0000 mg | ORAL_TABLET | Freq: Four times a day (QID) | ORAL | Status: DC | PRN
  Administered 2017-11-06: 650 mg via ORAL
  Filled 2017-11-06: qty 2

## 2017-11-06 MED ORDER — MIRTAZAPINE 15 MG PO TABS
7.5000 mg | ORAL_TABLET | Freq: Every day | ORAL | Status: DC
  Administered 2017-11-06 – 2017-11-08 (×3): 7.5 mg via ORAL
  Filled 2017-11-06 (×3): qty 1

## 2017-11-06 MED ORDER — ENSURE ENLIVE PO LIQD
237.0000 mL | Freq: Two times a day (BID) | ORAL | Status: DC
  Administered 2017-11-07 – 2017-11-09 (×5): 237 mL via ORAL

## 2017-11-06 MED ORDER — SODIUM CHLORIDE 0.9 % IV BOLUS
1000.0000 mL | Freq: Once | INTRAVENOUS | Status: AC
  Administered 2017-11-06: 1000 mL via INTRAVENOUS

## 2017-11-06 MED ORDER — MONTELUKAST SODIUM 10 MG PO TABS
10.0000 mg | ORAL_TABLET | Freq: Every day | ORAL | Status: DC
  Administered 2017-11-08: 10 mg via ORAL
  Filled 2017-11-06 (×3): qty 1

## 2017-11-06 MED ORDER — POLYETHYLENE GLYCOL 3350 17 G PO PACK
17.0000 g | PACK | Freq: Every day | ORAL | Status: DC
  Administered 2017-11-06 – 2017-11-09 (×4): 17 g via ORAL
  Filled 2017-11-06 (×4): qty 1

## 2017-11-06 NOTE — H&P (Signed)
History and Physical    [name] [name] FGH:[id number] DOB: February 24, 1946 DOA: 11/06/2017  PCP: Hoyt Koch, MD  Patient coming from: home  I have personally briefly reviewed patient's old medical records in [place]  Chief Complaint: feels sick  HPI: [name] [name] is Akaila Rambo 72 y.o. female with medical history significant of T2DM, HFpEF, chronic lymphedema, HTN, and multiple other medical problems presenting with decreased appetite, nausea, and feeling "sick".  She was recently admitted for similar problems, but at that time she had nausea, vomiting, diarrhea.  At this time the diarrhea has stopped and she's no longer vomiting, but she feels generally weak and tired with decreased appetite.  She denies fevers, chills, CP, SOB, and pain in general.  She doesn't feel she's constipated, but hasn't had Taji Barretto BM since last week (she thinks she's just not eating).       ED Course: Labs, EKG, IVF.  Admit for AKI  Review of Systems: As per HPI otherwise 10 point review of systems negative.   Past Medical History:  Diagnosis Date  . Acute kidney injury ([place])   . Aortic stenosis, mild 11/17/2013  . Arthritis    "both knees, spine, back, fingers" (11/29/2013)  . CHF (congestive heart failure) ([place])   . Edema   . GERD (gastroesophageal reflux disease)   . Heart murmur   . History of diastolic dysfunction    Echo 09/7891 with diastolic dysfunction  . HTN (hypertension)   . Morbid obesity ([place])   . OA (osteoarthritis)   . PAC (premature atrial contraction)    per prior Holter  . Palpitations   . Pneumonia    "as Tamaira Ciriello child"  . PVC's (premature ventricular contractions)    per prior Holter  . SVT (supraventricular tachycardia) ([place])   . Tachycardia    noted at 07/28/11 visit. Started on beta blocker. Possible atrial flutter vs long PR tachycardia/AVNRT  . Type II diabetes mellitus ([place])     Past Surgical History:  Procedure Laterality Date  . CESAREAN SECTION  1985  . CESAREAN  SECTION  1985  . SUPRAVENTRICULAR TACHYCARDIA ABLATION  11/29/2013  . SUPRAVENTRICULAR TACHYCARDIA ABLATION N/Kayleeann Huxford 11/29/2013   Procedure: SUPRAVENTRICULAR TACHYCARDIA ABLATION;  Surgeon: Evans Lance, MD;  Location: [facility] CATH LAB;  Service: Cardiovascular;  Laterality: N/Euphemia Lingerfelt;  . US ECHOCARDIOGRAPHY  07/25/2008   EF 55-60%  . VAGINAL DELIVERY       reports that she quit smoking about 31 years ago. Her smoking use included cigarettes. She has Joby Richart 30.00 pack-year smoking history. She has never used smokeless tobacco. She reports that she does not drink alcohol or use drugs.  Allergies  Allergen Reactions  . Oxycodone Hcl Nausea And Vomiting  . Penicillins Itching    Has patient had Meelah Tallo PCN reaction causing immediate rash, facial/tongue/throat swelling, SOB or lightheadedness with hypotension: No Has patient had Daleen Steinhaus PCN reaction causing severe rash involving mucus membranes or skin necrosis: No Has patient had Dangelo Guzzetta PCN reaction that required hospitalization: No Has patient had Darran Gabay PCN reaction occurring within the last 10 years: No If all of the above answers are "NO", then may proceed with Cephalosporin use.    Family History  Problem Relation Age of Onset  . Alzheimer's disease Mother   . Lung cancer Mother   . COPD Father   . Diabetes Maternal Uncle   . Stroke Neg Hx   . Colon cancer Neg Hx    Prior to Admission medications   Medication Sig Start  Date End Date Taking? Authorizing Provider  acetaminophen (TYLENOL) 325 MG tablet Take 2 tablets (650 mg total) by mouth every 6 (six) hours as needed for mild pain (or Fever >/= 101). 11/22/13  Yes Delfina Redwood, MD  aspirin EC 81 MG tablet Take 81 mg by mouth daily with breakfast. Takes once or twice weekly   Yes [provider]  losartan (COZAAR) 100 MG tablet Take 1 tablet (100 mg total) by mouth daily. 10/11/17  Yes Hoyt Koch, MD  metFORMIN (GLUCOPHAGE) 500 MG tablet TAKE 1 TABLET BY MOUTH DAILY WITH BREAKFAST. 06/16/17  Yes  Hoyt Koch, MD  methocarbamol (ROBAXIN) 500 MG tablet Take 1 tablet (500 mg total) by mouth every 6 (six) hours as needed for muscle spasms. 04/12/17  Yes Hoyt Koch, MD  metoprolol tartrate (LOPRESSOR) 50 MG tablet Take 1 tablet (50 mg total) by mouth 2 (two) times daily. 10/11/17  Yes Hoyt Koch, MD  montelukast (SINGULAIR) 10 MG tablet Take 10 mg by mouth daily. 10/19/17  Yes [provider]  Neomycin-Bacitracin-Polymyxin ([facility] ANTIBIOTIC) [phone] OINT APPLY AFTER CLEANING BEFORE WRAPPING LGS 11/23/16  Yes Hoyt Koch, MD  ondansetron (ZOFRAN) 4 MG tablet Take 1 tablet (4 mg total) by mouth every 8 (eight) hours as needed for nausea or vomiting. 10/18/17  Yes Dessa Phi, DO  promethazine (PHENERGAN) 25 MG tablet Take 1 tablet (25 mg total) by mouth every 8 (eight) hours as needed for nausea or vomiting. 10/15/17  Yes Hoyt Koch, MD    Physical Exam: Vitals:   11/06/17 1502 11/06/17 1650 11/06/17 1705 11/06/17 1714  BP: 112/76 107/69 (!) 123/53   Pulse: 93 99 99   Resp: 18 19 (!) 30   Temp:   97.8 F (36.6 C)   TempSrc:   Oral   SpO2: 93% 98% 100%   Weight:    120.4 kg (265 lb 6.4 oz)  Height:    5\' 11"  (1.803 m)    Constitutional: NAD, calm, comfortable Vitals:   11/06/17 1502 11/06/17 1650 11/06/17 1705 11/06/17 1714  BP: 112/76 107/69 (!) 123/53   Pulse: 93 99 99   Resp: 18 19 (!) 30   Temp:   97.8 F (36.6 C)   TempSrc:   Oral   SpO2: 93% 98% 100%   Weight:    120.4 kg (265 lb 6.4 oz)  Height:    5\' 11"  (1.803 m)   Eyes: PERRL, lids and conjunctivae normal ENMT: Mucous membranes are moist. Posterior pharynx clear of any exudate or lesions.Normal dentition.  Neck: normal, supple, no masses, no thyromegaly Respiratory: clear to auscultation bilaterally, no wheezing, no crackles. Normal respiratory effort. No accessory muscle use.  Cardiovascular: Regular rate and rhythm, no murmurs / rubs / gallops.   Abdomen:  no tenderness, no masses palpated. No hepatosplenomegaly. Bowel sounds positive.  Musculoskeletal: no clubbing / cyanosis. No joint deformity upper and lower extremities. Good ROM, no contractures. Normal muscle tone.  Skin: bilateral LE lymphedema Neurologic: CN 2-12 grossly intact. Moving all extremities.  Psychiatric: Normal judgment and insight. Alert and oriented x 3. Normal mood.   Labs on Admission: I have personally reviewed following labs and imaging studies  CBC: Recent Labs  Lab 11/06/17 1139  WBC 8.5  NEUTROABS 6.7  HGB 9.8*  HCT 31.2*  MCV 75.7*  PLT 947*   Basic Metabolic Panel: Recent Labs  Lab 11/06/17 1139  NA 133*  K 5.5*  CL 103  CO2 17*  GLUCOSE 122*  BUN 58*  CREATININE 2.60*  CALCIUM 9.1   GFR: Estimated Creatinine Clearance: 28.4 mL/min (Yafet Cline) (by C-G formula based on SCr of 2.6 mg/dL (H)). Liver Function Tests: Recent Labs  Lab 11/06/17 1139  AST 23  ALT 12  ALKPHOS 73  BILITOT 0.5  PROT 8.7*  ALBUMIN 3.1*   Recent Labs  Lab 11/06/17 1139  LIPASE 28   No results for input(s): AMMONIA in the last 168 hours. Coagulation Profile: No results for input(s): INR, PROTIME in the last 168 hours. Cardiac Enzymes: No results for input(s): CKTOTAL, CKMB, CKMBINDEX, TROPONINI in the last 168 hours. BNP (last 3 results) No results for input(s): PROBNP in the last 8760 hours. HbA1C: No results for input(s): HGBA1C in the last 72 hours. CBG: No results for input(s): GLUCAP in the last 168 hours. Lipid Profile: No results for input(s): CHOL, HDL, LDLCALC, TRIG, CHOLHDL, LDLDIRECT in the last 72 hours. Thyroid Function Tests: No results for input(s): TSH, T4TOTAL, FREET4, T3FREE, THYROIDAB in the last 72 hours. Anemia Panel: No results for input(s): VITAMINB12, FOLATE, FERRITIN, TIBC, IRON, RETICCTPCT in the last 72 hours. Urine analysis:    Component Value Date/Time   COLORURINE YELLOW 10/16/2017 1552   APPEARANCEUR HAZY (Tahjanae Blankenburg) 10/16/2017 1552     LABSPEC 1.013 10/16/2017 1552   PHURINE 5.0 10/16/2017 1552   GLUCOSEU NEGATIVE 10/16/2017 1552   HGBUR SMALL (Stepheni Cameron) 10/16/2017 1552   BILIRUBINUR Neg 10/22/2017 0950   KETONESUR NEGATIVE 10/16/2017 1552   PROTEINUR Negative 10/22/2017 0950   PROTEINUR NEGATIVE 10/16/2017 1552   UROBILINOGEN 0.2 10/22/2017 0950   UROBILINOGEN 1.0 12/09/2013 2303   NITRITE Neg 10/22/2017 0950   NITRITE NEGATIVE 10/16/2017 1552   LEUKOCYTESUR Negative 10/22/2017 0950    Radiological Exams on Admission: No results found.  EKG: Independently reviewed. Appears similar to priors, now with nonspecific IVCD.   Assessment/Plan Active Problems:   Acute kidney injury ([place])  Acute Kidney Injury  Hyperkalemia NAGMA:  Likely 2/2 hypovolemia in the setting of poor PO intake with decreased appetite.  Follow with IVF.  Associated mild hyperkalemia and non anion gap metabolic acidosis, follow. Follow UA Hold losartan  Failure to thrive  Decreased Appetite: Pt with intractable N/V/D at last hospitalization.  This seems to be improved, but she feels persistently "weak and out of it" with no appetite.  Esophagram with dysmotility at last hospitalization.  Abdominal CT with possible appendagitis.  Otherwise, w/u seems to have been unremarkable then.  Start remeron for appetite GI f/u as outpatient for dysmotility PT Follow KUB for possible constipation, bowel regimen  Anemia: follow  Thrombocytosis: follow with IVF  Lymphedema: follow wound care recs  T2DM: hold metformin, SSI  HTN: metoprolol, holding losartan  DVT prophylaxis: heparin  Code Status: full  Family Communication: none at bedside  Disposition Plan: pending improvement  Consults called: none  Admission status: observation     Fayrene Helper MD [facility] Pager [phone]  If 7PM-7AM, please contact night-coverage [email or web address] Password [facility]  11/06/2017, 8:13 PM

## 2017-11-06 NOTE — ED Notes (Signed)
ED TO INPATIENT HANDOFF REPORT  Name/Age/Gender [name] [name] 72 y.o. female  Code Status Code Status History    Date Active Date Inactive Code Status Order ID Comments User Context   10/16/2017 2045 10/18/2017 2053 Full Code [id number]  Florencia Reasons, MD Inpatient   11/29/2013 1307 11/30/2013 1523 Full Code [id number]  Evans Lance, MD Inpatient   11/23/2013 1346 11/29/2013 1138 Full Code [id number]  Flora Lipps Inpatient   11/17/2013 1451 11/23/2013 1346 Full Code [id number]  Oswald Hillock, MD Inpatient      Home/SNF/Other Home  Chief Complaint weakness  Level of Care/Admitting Diagnosis ED Disposition    ED Disposition Condition [name] [[id number]]  Level of Care: Med-Surg [16]  Diagnosis: Acute kidney injury [facility]) [017793]  Admitting Physician: Elodia Florence [phone]  Attending Physician: Cephus Slater, A CALDWELL [phone]  PT Class (Do Not Modify): Observation [104]  PT Acc Code (Do Not Modify): Observation [10022]       Medical History Past Medical History:  Diagnosis Date  . Acute kidney injury ([place])   . Aortic stenosis, mild 11/17/2013  . Arthritis    "both knees, spine, back, fingers" (11/29/2013)  . CHF (congestive heart failure) ([place])   . Edema   . GERD (gastroesophageal reflux disease)   . Heart murmur   . History of diastolic dysfunction    Echo 09/2261 with diastolic dysfunction  . HTN (hypertension)   . Morbid obesity ([place])   . OA (osteoarthritis)   . PAC (premature atrial contraction)    per prior Holter  . Palpitations   . Pneumonia    "as a child"  . PVC's (premature ventricular contractions)    per prior Holter  . SVT (supraventricular tachycardia) ([place])   . Tachycardia    noted at 07/28/11 visit. Started on beta blocker. Possible atrial flutter vs long PR tachycardia/AVNRT  . Type II diabetes mellitus ([facility])     Allergies Allergies  Allergen Reactions  . Oxycodone Hcl Nausea And  Vomiting  . Penicillins Itching    Has patient had a PCN reaction causing immediate rash, facial/tongue/throat swelling, SOB or lightheadedness with hypotension: No Has patient had a PCN reaction causing severe rash involving mucus membranes or skin necrosis: No Has patient had a PCN reaction that required hospitalization: No Has patient had a PCN reaction occurring within the last 10 years: No If all of the above answers are "NO", then may proceed with Cephalosporin use.    IV Location/Drains/Wounds Patient Lines/Drains/Airways Status   Active Line/Drains/Airways    Name:   Placement date:   Placement time:   Site:   Days:   External Urinary Catheter   10/16/17    1850    -   21   Wound / Incision (Open or Dehisced) 10/17/17 Leg Right;Left weeping bilateral lower extremeties   10/17/17    1133    Leg   20          Labs/Imaging Results for orders placed or performed during the hospital encounter of 11/06/17 (from the past 48 hour(s))  Comprehensive metabolic panel     Status: Abnormal   Collection Time: 11/06/17 11:39 AM  Result Value Ref Range   Sodium 133 (L) 135 - 145 mmol/L   Potassium 5.5 (H) 3.5 - 5.1 mmol/L   Chloride 103 98 - 111 mmol/L   CO2 17 (L) 22 - 32 mmol/L   Glucose, Bld 122 (H)  70 - 99 mg/dL   BUN 58 (H) 8 - 23 mg/dL   Creatinine, Ser 2.60 (H) 0.44 - 1.00 mg/dL   Calcium 9.1 8.9 - 10.3 mg/dL   Total Protein 8.7 (H) 6.5 - 8.1 g/dL   Albumin 3.1 (L) 3.5 - 5.0 g/dL   AST 23 15 - 41 U/L   ALT 12 0 - 44 U/L   Alkaline Phosphatase 73 38 - 126 U/L   Total Bilirubin 0.5 0.3 - 1.2 mg/dL   GFR calc non Af Amer 17 (L) >60 mL/min   GFR calc Af Amer 20 (L) >60 mL/min    Comment: (NOTE) The eGFR has been calculated using the CKD EPI equation. This calculation has not been validated in all clinical situations. eGFR's persistently <60 mL/min signify possible Chronic Kidney Disease.    Anion gap 13 5 - 15    Comment: Performed at [facility], [place]  [address], [place], [place] [address]  Lipase, blood     Status: None   Collection Time: 11/06/17 11:39 AM  Result Value Ref Range   Lipase 28 11 - 51 U/L    Comment: Performed at [facility], [place] [address], [place], [place] [address]  CBC with Differential     Status: Abnormal   Collection Time: 11/06/17 11:39 AM  Result Value Ref Range   WBC 8.5 4.0 - 10.5 K/uL   RBC 4.12 3.87 - 5.11 MIL/uL   Hemoglobin 9.8 (L) 12.0 - 15.0 g/dL   HCT 31.2 (L) 36.0 - 46.0 %   MCV 75.7 (L) 78.0 - 100.0 fL   MCH 23.8 (L) 26.0 - 34.0 pg   MCHC 31.4 30.0 - 36.0 g/dL   RDW 18.6 (H) 11.5 - 15.5 %   Platelets 528 (H) 150 - 400 K/uL   Neutrophils Relative % 79 %   Neutro Abs 6.7 1.7 - 7.7 K/uL   Lymphocytes Relative 14 %   Lymphs Abs 1.2 0.7 - 4.0 K/uL   Monocytes Relative 6 %   Monocytes Absolute 0.5 0.1 - 1.0 K/uL   Eosinophils Relative 0 %   Eosinophils Absolute 0.0 0.0 - 0.7 K/uL   Basophils Relative 1 %   Basophils Absolute 0.1 0.0 - 0.1 K/uL    Comment: Performed at [facility], [place] [address] [place], [place] [address]   No results found.  Pending Labs Unresulted Labs (From admission, onward)   Start     Ordered   11/06/17 1106  Urinalysis, Routine w reflex microscopic  STAT,   STAT     11/06/17 1106   Signed and Held  CBC  (heparin)  Once,   R    Comments:  Baseline for heparin therapy IF NOT ALREADY DRAWN.  Notify MD if PLT < 100 K.    Signed and Held   Signed and Held  Creatinine, serum  (heparin)  Once,   R    Comments:  Baseline for heparin therapy IF NOT ALREADY DRAWN.    Signed and Held   Signed and Held  Comprehensive metabolic panel  Tomorrow morning,   R     Signed and Held   Signed and Held  CBC  Tomorrow morning,   R     Signed and Held   Signed and Held  Magnesium  Tomorrow morning,   R     Signed and Held      Vitals/Pain Today's Vitals   11/06/17 [phone] 11/06/17 [phone] 11/06/17 1240  11/06/17 1502  BP: (!) 96/52  132/78 112/76   Pulse: (!) 109  97 93  Resp: '16  14 18  ' Temp: (!) 97.5 F (36.4 C)     TempSrc: Oral     SpO2: 100%  100% 93%  Weight: 260 lb (117.9 kg)     Height: '5\' 11"'  (1.803 m)     PainSc:  0-No pain      Isolation Precautions No active isolations  Medications Medications  lactated ringers bolus 1,000 mL (has no administration in time range)  lactated ringers infusion (has no administration in time range)  sodium chloride 0.9 % bolus 1,000 mL ( Intravenous Stopped 11/06/17 1359)  metoCLOPramide (REGLAN) injection 10 mg (10 mg Intravenous Given 11/06/17 1143)    Mobility walks with person assist

## 2017-11-06 NOTE — ED Provider Notes (Signed)
[facility] DEPT Provider Note   CSN: [id number] Arrival date & time: 11/06/17  0845     History   Chief Complaint Chief Complaint  Patient presents with  . Weakness    HPI [name] [name] is a 73 y.o. female.  Patient 72 year old female with a history of diabetes, hypertension, CHF who presents with nausea.  She started having nausea vomiting diarrhea about a month ago.  She had some ongoing problems with nausea.  She was admitted to the hospital for the symptoms about 3 weeks ago.  She states that since that time she has not had any vomiting but she has had some persistent nausea and decreased oral intake.  She also has only had about 2 bowel movement since that time without diarrhea.  She has some decreased urine output.  She does have a history of chronic kidney disease.  She states she is not really able to eat or drink much because of the nausea.  She has Zofran and Phenergan at home which she uses periodically without improvement in symptoms.  She denies any fevers.  No chest pain or shortness of breath.  No abdominal pain.     Past Medical History:  Diagnosis Date  . Acute kidney injury ([place])   . Aortic stenosis, mild 11/17/2013  . Arthritis    "both knees, spine, back, fingers" (11/29/2013)  . CHF (congestive heart failure) ([place])   . Edema   . GERD (gastroesophageal reflux disease)   . Heart murmur   . History of diastolic dysfunction    Echo 06/863 with diastolic dysfunction  . HTN (hypertension)   . Morbid obesity ([place])   . OA (osteoarthritis)   . PAC (premature atrial contraction)    per prior Holter  . Palpitations   . Pneumonia    "as a child"  . PVC's (premature ventricular contractions)    per prior Holter  . SVT (supraventricular tachycardia) ([place])   . Tachycardia    noted at 07/28/11 visit. Started on beta blocker. Possible atrial flutter vs long PR tachycardia/AVNRT  . Type II diabetes mellitus [facility])     Patient Active  Problem List   Diagnosis Date Noted  . Acute cystitis without hematuria 10/22/2017  . AKI (acute kidney injury) ([place]) 10/16/2017  . Routine general medical examination at a health care facility 05/10/2014  . Paroxysmal SVT (supraventricular tachycardia) ([place]) 11/28/2013  . Physical deconditioning 11/23/2013  . Nausea with vomiting 11/17/2013  . Multilevel spine pain 11/17/2013  . Aortic stenosis, mild 11/17/2013  . DM type 2 (diabetes mellitus, type 2) ([place]) 02/02/2013  . Arthritis 10/19/2012  . Mixed incontinence 10/19/2012  . GERD (gastroesophageal reflux disease) 04/20/2011  . Venous stasis of lower extremity 01/20/2011  . HTN (hypertension) 01/20/2011  . Morbid obesity ([place])     Past Surgical History:  Procedure Laterality Date  . CESAREAN SECTION  1985  . CESAREAN SECTION  1985  . SUPRAVENTRICULAR TACHYCARDIA ABLATION  11/29/2013  . SUPRAVENTRICULAR TACHYCARDIA ABLATION N/A 11/29/2013   Procedure: SUPRAVENTRICULAR TACHYCARDIA ABLATION;  Surgeon: Evans Lance, MD;  Location: [facility] CATH LAB;  Service: Cardiovascular;  Laterality: N/A;  . US ECHOCARDIOGRAPHY  07/25/2008   EF 55-60%  . VAGINAL DELIVERY       OB History   None      Home Medications    Prior to Admission medications   Medication Sig Start Date End Date Taking? Authorizing Provider  acetaminophen (TYLENOL) 325 MG tablet Take 2 tablets (650 mg  total) by mouth every 6 (six) hours as needed for mild pain (or Fever >/= 101). 11/22/13   Delfina Redwood, MD  aspirin EC 81 MG tablet Take 81 mg by mouth daily with breakfast. Takes once or twice weekly    [provider]  losartan (COZAAR) 100 MG tablet Take 1 tablet (100 mg total) by mouth daily. 10/11/17   Hoyt Koch, MD  metFORMIN (GLUCOPHAGE) 500 MG tablet TAKE 1 TABLET BY MOUTH DAILY WITH BREAKFAST. 06/16/17   Hoyt Koch, MD  methocarbamol (ROBAXIN) 500 MG tablet Take 1 tablet (500 mg total) by mouth every 6 (six) hours as needed for  muscle spasms. 04/12/17   Hoyt Koch, MD  metoprolol tartrate (LOPRESSOR) 50 MG tablet Take 1 tablet (50 mg total) by mouth 2 (two) times daily. 10/11/17   Hoyt Koch, MD  Neomycin-Bacitracin-Polymyxin ([employer] ANTIBIOTIC) [phone] OINT APPLY AFTER CLEANING BEFORE WRAPPING LGS 11/23/16   Hoyt Koch, MD  ondansetron (ZOFRAN) 4 MG tablet Take 1 tablet (4 mg total) by mouth every 8 (eight) hours as needed for nausea or vomiting. 10/18/17   Dessa Phi, DO  promethazine (PHENERGAN) 25 MG tablet Take 1 tablet (25 mg total) by mouth every 8 (eight) hours as needed for nausea or vomiting. Patient not taking: Reported on 10/25/2017 10/15/17   Hoyt Koch, MD    Family History Family History  Problem Relation Age of Onset  . Alzheimer's disease Mother   . Lung cancer Mother   . COPD Father   . Diabetes Maternal Uncle   . Stroke Neg Hx   . Colon cancer Neg Hx     Social History Social History   Tobacco Use  . Smoking status: Former Smoker    Packs/day: 2.00    Years: 15.00    Pack years: 30.00    Types: Cigarettes    Last attempt to quit: 11/23/1985    Years since quitting: 31.9  . Smokeless tobacco: Never Used  Substance Use Topics  . Alcohol use: No  . Drug use: No     Allergies   Oxycodone hcl and Penicillins   Review of Systems Review of Systems  Constitutional: Positive for fatigue. Negative for chills, diaphoresis and fever.  HENT: Negative for congestion, rhinorrhea and sneezing.   Eyes: Negative.   Respiratory: Negative for cough, chest tightness and shortness of breath.   Cardiovascular: Negative for chest pain and leg swelling.  Gastrointestinal: Positive for nausea. Negative for abdominal pain, blood in stool, diarrhea and vomiting.  Genitourinary: Positive for decreased urine volume. Negative for difficulty urinating, flank pain, frequency and hematuria.  Musculoskeletal: Negative for arthralgias and back pain.  Skin: Negative  for rash.  Neurological: Negative for dizziness, speech difficulty, weakness, numbness and headaches.     Physical Exam Updated Vital Signs BP 132/78 (BP Location: Right Arm)   Pulse 97   Temp (!) 97.5 F (36.4 C) (Oral)   Resp 14   Ht 5\' 11"  (1.803 m)   Wt 117.9 kg (260 lb)   SpO2 100%   BMI 36.26 kg/m   Physical Exam  Constitutional: She is oriented to person, place, and time. She appears well-developed and well-nourished.  HENT:  Head: Normocephalic and atraumatic.  Eyes: Pupils are equal, round, and reactive to light.  Neck: Normal range of motion. Neck supple.  Cardiovascular: Normal rate, regular rhythm and normal heart sounds.  Pulmonary/Chest: Effort normal and breath sounds normal. No respiratory distress. She has no wheezes.  She has no rales. She exhibits no tenderness.  Abdominal: Soft. Bowel sounds are normal. There is no tenderness. There is no rebound and no guarding.  Musculoskeletal: Normal range of motion. She exhibits edema.  Marked edema to lower extremities bilaterally which patient states is unchanged from baseline  Lymphadenopathy:    She has no cervical adenopathy.  Neurological: She is alert and oriented to person, place, and time.  Skin: Skin is warm and dry. No rash noted.  Psychiatric: She has a normal mood and affect.     ED Treatments / Results  Labs (all labs ordered are listed, but only abnormal results are displayed) Labs Reviewed  COMPREHENSIVE METABOLIC PANEL - Abnormal; Notable for the following components:      Result Value   Sodium 133 (*)    Potassium 5.5 (*)    CO2 17 (*)    Glucose, Bld 122 (*)    BUN 58 (*)    Creatinine, Ser 2.60 (*)    Total Protein 8.7 (*)    Albumin 3.1 (*)    GFR calc non Af Amer 17 (*)    GFR calc Af Amer 20 (*)    All other components within normal limits  CBC WITH DIFFERENTIAL/PLATELET - Abnormal; Notable for the following components:   Hemoglobin 9.8 (*)    HCT 31.2 (*)    MCV 75.7 (*)    MCH  23.8 (*)    RDW 18.6 (*)    Platelets 528 (*)    All other components within normal limits  LIPASE, BLOOD  URINALYSIS, ROUTINE W REFLEX MICROSCOPIC    EKG EKG Interpretation  Date/Time:  Saturday November 06 2017 11:25:34 EDT Ventricular Rate:  93 PR Interval:    QRS Duration: 120 QT Interval:  390 QTC Calculation: 486 R Axis:   -36 Text Interpretation:  Sinus rhythm Nonspecific IVCD with LAD Left ventricular hypertrophy Nonspecific T abnormalities, lateral leads since last tracing no significant change Confirmed by Malvin Johns [phone]) on 11/06/2017 1:30:35 PM   Radiology No results found.  Procedures Procedures (including critical care time)  Medications Ordered in ED Medications  sodium chloride 0.9 % bolus 1,000 mL ( Intravenous Stopped 11/06/17 1359)  metoCLOPramide (REGLAN) injection 10 mg (10 mg Intravenous Given 11/06/17 1143)     Initial Impression / Assessment and Plan / ED Course  I have reviewed the triage vital signs and the nursing notes.  Pertinent labs & imaging results that were available during my care of the patient were reviewed by me and considered in my medical decision making (see chart for details).     Patient is a 72 year old female who presents with generalized weakness and ongoing nausea.  This is been ongoing problem for about a month.  She no longer has vomiting.  She denies abdominal pain.  Her labs do show an acute kidney injury.  We have been unable to collect a urine sample as of yet.  She has not been able to pee despite 1 L of IV fluids.  Her potassium is mildly elevated, but she has no EKG changes.  Her hemoglobin is low but similar to baseline values.  She was initially mildly hypotensive and tachycardic but this has improved with IV fluids.  I spoke with Dr. Florene Glen who will admit the pt.  Final Clinical Impressions(s) / ED Diagnoses   Final diagnoses:  Generalized weakness  AKI (acute kidney injury) [facility])    ED Discharge Orders     None  Malvin Johns, MD 11/06/17 [phone]

## 2017-11-06 NOTE — Consult Note (Deleted)
WOC consulted for lymphedema, the treatment of lymphedema is managed by occupational therapist certified and trained for this care.  I have attached lymphedema centers in the local area that the patient could be referred to for care at the time of DC. Myleka Moncure [facility], CNS, CWON-AP [phone]    Lymphedema  Resources (updated July 2019 )  Each site requires a referral from your primary care MD [place], [place]  [phone] (Upper extremities)  [place], [place] [phone] (Lower extremities)  [place] [address] [place], [place] [address] [phone] [facility] [place], [place] [address] [phone] [place], [address] Medical Office Building [place], [place] [phone]  [facility] [place], [place] [address] [phone]  [facility]  (only treatment for lymphedema related to cancer diagnosis) [place], [place] [address] [phone]    [facility] [address] [place], [place] [address] [phone]  [facility] [address] [place], [place] [address] [phone] [facility] [address] [place], [place] [address] [phone]

## 2017-11-06 NOTE — ED Triage Notes (Signed)
She c/o feeling "kinda weak". She saw a provider this morning, who advised her to come to hospital as she may be "dehydrated".

## 2017-11-07 DIAGNOSIS — I10 Essential (primary) hypertension: Secondary | ICD-10-CM

## 2017-11-07 DIAGNOSIS — E875 Hyperkalemia: Secondary | ICD-10-CM

## 2017-11-07 DIAGNOSIS — E872 Acidosis, unspecified: Secondary | ICD-10-CM

## 2017-11-07 DIAGNOSIS — D509 Iron deficiency anemia, unspecified: Secondary | ICD-10-CM | POA: Diagnosis present

## 2017-11-07 DIAGNOSIS — Z833 Family history of diabetes mellitus: Secondary | ICD-10-CM | POA: Diagnosis not present

## 2017-11-07 DIAGNOSIS — D75839 Thrombocytosis, unspecified: Secondary | ICD-10-CM

## 2017-11-07 DIAGNOSIS — E119 Type 2 diabetes mellitus without complications: Secondary | ICD-10-CM

## 2017-11-07 DIAGNOSIS — K219 Gastro-esophageal reflux disease without esophagitis: Secondary | ICD-10-CM | POA: Diagnosis present

## 2017-11-07 DIAGNOSIS — D649 Anemia, unspecified: Secondary | ICD-10-CM

## 2017-11-07 DIAGNOSIS — R531 Weakness: Secondary | ICD-10-CM | POA: Diagnosis present

## 2017-11-07 DIAGNOSIS — L03116 Cellulitis of left lower limb: Secondary | ICD-10-CM | POA: Diagnosis present

## 2017-11-07 DIAGNOSIS — R627 Adult failure to thrive: Secondary | ICD-10-CM | POA: Diagnosis present

## 2017-11-07 DIAGNOSIS — I130 Hypertensive heart and chronic kidney disease with heart failure and stage 1 through stage 4 chronic kidney disease, or unspecified chronic kidney disease: Secondary | ICD-10-CM | POA: Diagnosis present

## 2017-11-07 DIAGNOSIS — I878 Other specified disorders of veins: Secondary | ICD-10-CM | POA: Diagnosis present

## 2017-11-07 DIAGNOSIS — Z7984 Long term (current) use of oral hypoglycemic drugs: Secondary | ICD-10-CM | POA: Diagnosis not present

## 2017-11-07 DIAGNOSIS — E861 Hypovolemia: Secondary | ICD-10-CM | POA: Diagnosis present

## 2017-11-07 DIAGNOSIS — K5900 Constipation, unspecified: Secondary | ICD-10-CM | POA: Diagnosis present

## 2017-11-07 DIAGNOSIS — N183 Chronic kidney disease, stage 3 (moderate): Secondary | ICD-10-CM | POA: Diagnosis present

## 2017-11-07 DIAGNOSIS — N179 Acute kidney failure, unspecified: Principal | ICD-10-CM

## 2017-11-07 DIAGNOSIS — D473 Essential (hemorrhagic) thrombocythemia: Secondary | ICD-10-CM

## 2017-11-07 DIAGNOSIS — Z7982 Long term (current) use of aspirin: Secondary | ICD-10-CM | POA: Diagnosis not present

## 2017-11-07 DIAGNOSIS — E1122 Type 2 diabetes mellitus with diabetic chronic kidney disease: Secondary | ICD-10-CM | POA: Diagnosis present

## 2017-11-07 DIAGNOSIS — I890 Lymphedema, not elsewhere classified: Secondary | ICD-10-CM | POA: Diagnosis present

## 2017-11-07 DIAGNOSIS — I5032 Chronic diastolic (congestive) heart failure: Secondary | ICD-10-CM | POA: Diagnosis present

## 2017-11-07 DIAGNOSIS — Z6836 Body mass index (BMI) 36.0-36.9, adult: Secondary | ICD-10-CM | POA: Diagnosis not present

## 2017-11-07 DIAGNOSIS — L97929 Non-pressure chronic ulcer of unspecified part of left lower leg with unspecified severity: Secondary | ICD-10-CM | POA: Diagnosis present

## 2017-11-07 DIAGNOSIS — I350 Nonrheumatic aortic (valve) stenosis: Secondary | ICD-10-CM | POA: Diagnosis present

## 2017-11-07 DIAGNOSIS — Z87891 Personal history of nicotine dependence: Secondary | ICD-10-CM | POA: Diagnosis not present

## 2017-11-07 LAB — COMPREHENSIVE METABOLIC PANEL
ALT: 9 U/L (ref 0–44)
AST: 16 U/L (ref 15–41)
Albumin: 2.2 g/dL — ABNORMAL LOW (ref 3.5–5.0)
Alkaline Phosphatase: 57 U/L (ref 38–126)
Anion gap: 6 (ref 5–15)
BUN: 48 mg/dL — ABNORMAL HIGH (ref 8–23)
CO2: 21 mmol/L — ABNORMAL LOW (ref 22–32)
Calcium: 8 mg/dL — ABNORMAL LOW (ref 8.9–10.3)
Chloride: 108 mmol/L (ref 98–111)
Creatinine, Ser: 1.82 mg/dL — ABNORMAL HIGH (ref 0.44–1.00)
GFR calc Af Amer: 31 mL/min — ABNORMAL LOW (ref >60)
GFR calc non Af Amer: 27 mL/min — ABNORMAL LOW (ref >60)
Glucose, Bld: 101 mg/dL — ABNORMAL HIGH (ref 70–99)
Potassium: 5.2 mmol/L — ABNORMAL HIGH (ref 3.5–5.1)
Sodium: 135 mmol/L (ref 135–145)
Total Bilirubin: 0.3 mg/dL (ref 0.3–1.2)
Total Protein: 5.9 g/dL — ABNORMAL LOW (ref 6.5–8.1)

## 2017-11-07 LAB — CBC
HCT: 22.3 % — ABNORMAL LOW (ref 36.0–46.0)
Hemoglobin: 7.2 g/dL — ABNORMAL LOW (ref 12.0–15.0)
MCH: 24.3 pg — ABNORMAL LOW (ref 26.0–34.0)
MCHC: 32.3 g/dL (ref 30.0–36.0)
MCV: 75.3 fL — ABNORMAL LOW (ref 78.0–100.0)
Platelets: 432 10*3/uL — ABNORMAL HIGH (ref 150–400)
RBC: 2.96 MIL/uL — ABNORMAL LOW (ref 3.87–5.11)
RDW: 18.7 % — ABNORMAL HIGH (ref 11.5–15.5)
WBC: 5.4 10*3/uL (ref 4.0–10.5)

## 2017-11-07 LAB — MAGNESIUM: Magnesium: 2 mg/dL (ref 1.7–2.4)

## 2017-11-07 MED ORDER — SODIUM CHLORIDE 0.9 % IV SOLN
100.0000 mg | Freq: Two times a day (BID) | INTRAVENOUS | Status: DC
  Administered 2017-11-07 (×2): 100 mg via INTRAVENOUS
  Filled 2017-11-07 (×3): qty 100

## 2017-11-07 NOTE — Progress Notes (Signed)
[facility] PROGRESS NOTE    Progress Note  [name] [name]  ELF:[id number] DOB: 1945/06/01 DOA: 11/06/2017 PCP: Hoyt Koch, MD     Brief Narrative:   [name] [name] is an 72 y.o. female past medical history of diabetes mellitus type 2, chronic diastolic heart failure with an EF of 60%, chronic lymphedema, hypertension who presents with decreased appetite  Assessment/Plan:   Acute kidney injury/hyperkalemia/non-anion gap metabolic acidosis: Renal in etiology likely due to decreased oral intake and probably infectious etiology due to . Baseline creatinine of 1.  Non-anion gap metabolic acidosis is now improved.  Infected venous stasis wer extremity ulcers and cellulitis Get a wound care consult, she has had a fever, with an oozing purulent left venous stasis ulcer, will start on IV doxycycline.  Essential HTN (hypertension): Continue metoprolol.  DM type 2 (diabetes mellitus, type 2) ([place]) Hold metformin agree with sliding scale insulin.  Microcytic anemia Check an anemia panel.  Thrombocytosis ([place]) I think is reactive in the setting of microcytic anemia concern about iron deficiency.    Lymphedema  DVT prophylaxis: lovenox Family Communication:none Disposition Plan/Barrier to D/C: home in 2 days Code Status:     Code Status Orders  (From admission, onward)        Start     Ordered   11/06/17 1745  Full code  Continuous     11/06/17 1744    Code Status History    Date Active Date Inactive Code Status Order ID Comments User Context   10/16/2017 2045 10/18/2017 2053 Full Code [id number]  Florencia Reasons, MD Inpatient   11/29/2013 1307 11/30/2013 1523 Full Code [id number]  Evans Lance, MD Inpatient   11/23/2013 1346 11/29/2013 1138 Full Code [id number]  Flora Lipps Inpatient   11/17/2013 1451 11/23/2013 1346 Full Code [id number]  Oswald Hillock, MD Inpatient        IV Access:    Peripheral IV   Procedures and diagnostic studies:   Dg Abd 1  View  Result Date: 11/06/2017 CLINICAL DATA:  Constipation. EXAM: ABDOMEN - 1 VIEW COMPARISON:  Abdominal CT 10/16/2017 FINDINGS: Left lateral abdomen excluded from the field of view on both images submitted. Small volume of stool in the ascending colon without increased stool burden to suggest constipation. Air-filled rectum and distal sigmoid colon with small volume of stool. Hyperdensities in the pelvis may represent combination of retained barium from prior radiologic studies within colonic diverticula in phleboliths. No evidence of free air. No bowel dilatation to suggest obstruction. Degenerative change in the left hip. IMPRESSION: Small volume of colonic stool. No increased stool burden to suggest constipation. No bowel obstruction. Electronically Signed   By: Jeb Levering M.D.   On: 11/06/2017 21:19     Medical Consultants:    None.  Anti-Infectives:   **IV doxycycline  Subjective:    [name] [name] is no new complaints.  Objective:    Vitals:   11/06/17 2137 11/07/17 0002 11/07/17 0309 11/07/17 0527  BP: 123/70   (!) 96/51  Pulse: (!) 109   62  Resp: 20   18  Temp: 99.8 F (37.7 C) 100.2 F (37.9 C) 99.3 F (37.4 C) 99.8 F (37.7 C)  TempSrc: Oral Oral Oral Oral  SpO2: 97%   98%  Weight:    121 kg (266 lb 12.1 oz)  Height:        Intake/Output Summary (Last 24 hours) at 11/07/2017 0804 Last data filed at 11/07/2017 0700 Gross per 24 hour  Intake 4614.43 ml  Output 400 ml  Net 4214.43 ml   Filed Weights   11/06/17 0934 11/06/17 1714 11/07/17 0527  Weight: 117.9 kg (260 lb) 120.4 kg (265 lb 6.4 oz) 121 kg (266 lb 12.1 oz)    Exam: General exam: In no acute distress. Respiratory system: Good air movement and clear to auscultation. Cardiovascular system: S1 & S2 heard, RRR.  Gastrointestinal system: Abdomen is nondistended, soft and nontender.  Central nervous system: Alert and oriented. No focal neurological deficits. Extremities: Lymphedema of the left  lower extremity has venous stasis ulcers which are probably infected with purulent material foul-smelling Skin: No rashes, lesions or ulcers Psychiatry: Judgement and insight appear normal. Mood & affect appropriate.    Data Reviewed:    Labs: Basic Metabolic Panel: Recent Labs  Lab 11/06/17 1139 11/07/17 0514  NA 133* 135  K 5.5* 5.2*  CL 103 108  CO2 17* 21*  GLUCOSE 122* 101*  BUN 58* 48*  CREATININE 2.60* 1.82*  CALCIUM 9.1 8.0*  MG  --  2.0   GFR Estimated Creatinine Clearance: 40.7 mL/min (A) (by C-G formula based on SCr of 1.82 mg/dL (H)). Liver Function Tests: Recent Labs  Lab 11/06/17 1139 11/07/17 0514  AST 23 16  ALT 12 9  ALKPHOS 73 57  BILITOT 0.5 0.3  PROT 8.7* 5.9*  ALBUMIN 3.1* 2.2*   Recent Labs  Lab 11/06/17 1139  LIPASE 28   No results for input(s): AMMONIA in the last 168 hours. Coagulation profile No results for input(s): INR, PROTIME in the last 168 hours.  CBC: Recent Labs  Lab 11/06/17 1139 11/07/17 0514  WBC 8.5 5.4  NEUTROABS 6.7  --   HGB 9.8* 7.2*  HCT 31.2* 22.3*  MCV 75.7* 75.3*  PLT 528* 432*   Cardiac Enzymes: No results for input(s): CKTOTAL, CKMB, CKMBINDEX, TROPONINI in the last 168 hours. BNP (last 3 results) No results for input(s): PROBNP in the last 8760 hours. CBG: No results for input(s): GLUCAP in the last 168 hours. D-Dimer: No results for input(s): DDIMER in the last 72 hours. Hgb A1c: No results for input(s): HGBA1C in the last 72 hours. Lipid Profile: No results for input(s): CHOL, HDL, LDLCALC, TRIG, CHOLHDL, LDLDIRECT in the last 72 hours. Thyroid function studies: No results for input(s): TSH, T4TOTAL, T3FREE, THYROIDAB in the last 72 hours.  Invalid input(s): FREET3 Anemia work up: No results for input(s): VITAMINB12, FOLATE, FERRITIN, TIBC, IRON, RETICCTPCT in the last 72 hours. Sepsis Labs: Recent Labs  Lab 11/06/17 1139 11/07/17 0514  WBC 8.5 5.4   Microbiology No results found  for this or any previous visit (from the past 240 hour(s)).   Medications:   . aspirin EC  81 mg Oral Q breakfast  . feeding supplement (ENSURE ENLIVE)  237 mL Oral BID BM  . heparin  5,000 Units Subcutaneous Q8H  . metoprolol tartrate  50 mg Oral BID  . mirtazapine  7.5 mg Oral QHS  . montelukast  10 mg Oral Daily  . polyethylene glycol  17 g Oral Daily   Continuous Infusions: . lactated ringers 125 mL/hr at 11/07/17 0343     LOS: 0 days   Charlynne Cousins  [facility] Pager [phone]  *Please refer to [email or web address], password [email or web address] to get updated schedule on who will round on this patient, as hospitalists switch teams weekly. If 7PM-7AM, please contact night-coverage at [email or web address], password [email or web address] for any overnight needs.  11/07/2017, 8:04 AM

## 2017-11-07 NOTE — Progress Notes (Signed)
PT Cancellation Note  Patient Details Name: [name] [name] MRN: [id number] DOB: Aug 29, 1945   Cancelled Treatment:    Reason Eval/Treat Not Completed: Patient politely declined today, stating she had too much going on today . She is home alone but from recent hospital admission stated last week some folks had come to do some assessments in her home. She was scheduled to begin OPPT at [place] as well. She used 4 wheeled RW in home and out of the home, independent with all her ADLs, and uses [employer] for her transportation.  Will come to see if we can complete assessment tomorrow morning per patient.    Jandi Swiger 11/07/2017, 11:01 AM

## 2017-11-07 NOTE — Progress Notes (Signed)
Pt able to eat dinner without nausea and a full [place] sandwich and jello at HS.  No c/o of nausea entire shirt.

## 2017-11-08 ENCOUNTER — Encounter (HOSPITAL_BASED_OUTPATIENT_CLINIC_OR_DEPARTMENT_OTHER): Payer: PPO

## 2017-11-08 ENCOUNTER — Ambulatory Visit: Payer: PPO | Admitting: Physical Therapy

## 2017-11-08 DIAGNOSIS — N183 Chronic kidney disease, stage 3 unspecified: Secondary | ICD-10-CM

## 2017-11-08 DIAGNOSIS — E669 Obesity, unspecified: Secondary | ICD-10-CM

## 2017-11-08 LAB — BASIC METABOLIC PANEL
Anion gap: 5 (ref 5–15)
BUN: 26 mg/dL — ABNORMAL HIGH (ref 8–23)
CO2: 24 mmol/L (ref 22–32)
Calcium: 8.2 mg/dL — ABNORMAL LOW (ref 8.9–10.3)
Chloride: 111 mmol/L (ref 98–111)
Creatinine, Ser: 1.09 mg/dL — ABNORMAL HIGH (ref 0.44–1.00)
GFR calc Af Amer: 58 mL/min — ABNORMAL LOW (ref >60)
GFR calc non Af Amer: 50 mL/min — ABNORMAL LOW (ref >60)
Glucose, Bld: 106 mg/dL — ABNORMAL HIGH (ref 70–99)
Potassium: 4.1 mmol/L (ref 3.5–5.1)
Sodium: 140 mmol/L (ref 135–145)

## 2017-11-08 LAB — CBC WITH DIFFERENTIAL/PLATELET
Basophils Absolute: 0.1 10*3/uL (ref 0.0–0.1)
Basophils Relative: 1 %
Eosinophils Absolute: 0.1 10*3/uL (ref 0.0–0.7)
Eosinophils Relative: 2 %
HCT: 26.2 % — ABNORMAL LOW (ref 36.0–46.0)
Hemoglobin: 8.3 g/dL — ABNORMAL LOW (ref 12.0–15.0)
Lymphocytes Relative: 35 %
Lymphs Abs: 2 10*3/uL (ref 0.7–4.0)
MCH: 24.3 pg — ABNORMAL LOW (ref 26.0–34.0)
MCHC: 31.7 g/dL (ref 30.0–36.0)
MCV: 76.8 fL — ABNORMAL LOW (ref 78.0–100.0)
Monocytes Absolute: 0.3 10*3/uL (ref 0.1–1.0)
Monocytes Relative: 6 %
Neutro Abs: 3.2 10*3/uL (ref 1.7–7.7)
Neutrophils Relative %: 56 %
Platelets: 371 10*3/uL (ref 150–400)
RBC: 3.41 MIL/uL — ABNORMAL LOW (ref 3.87–5.11)
RDW: 19.2 % — ABNORMAL HIGH (ref 11.5–15.5)
WBC: 5.6 10*3/uL (ref 4.0–10.5)

## 2017-11-08 LAB — FERRITIN: Ferritin: 58 ng/mL (ref 11–307)

## 2017-11-08 LAB — IRON AND TIBC
Iron: 29 ug/dL (ref 28–170)
Saturation Ratios: 16 % (ref 10.4–31.8)
TIBC: 177 ug/dL — ABNORMAL LOW (ref 250–450)
UIBC: 148 ug/dL

## 2017-11-08 LAB — FOLATE: Folate: 5.9 ng/mL — ABNORMAL LOW (ref >5.9)

## 2017-11-08 LAB — RETICULOCYTES
RBC.: 3.39 MIL/uL — ABNORMAL LOW (ref 3.87–5.11)
Retic Count, Absolute: 47.5 10*3/uL (ref 19.0–186.0)
Retic Ct Pct: 1.4 % (ref 0.4–3.1)

## 2017-11-08 LAB — VITAMIN B12: Vitamin B-12: 1323 pg/mL — ABNORMAL HIGH (ref 180–914)

## 2017-11-08 MED ORDER — DOXYCYCLINE HYCLATE 100 MG PO TABS
100.0000 mg | ORAL_TABLET | Freq: Two times a day (BID) | ORAL | Status: DC
  Administered 2017-11-08 – 2017-11-09 (×3): 100 mg via ORAL
  Filled 2017-11-08 (×3): qty 1

## 2017-11-08 MED ORDER — JUVEN PO PACK
1.0000 | PACK | Freq: Two times a day (BID) | ORAL | Status: DC
Start: 2017-11-08 — End: 2017-11-09
  Administered 2017-11-08 – 2017-11-09 (×2): 1 via ORAL
  Filled 2017-11-08 (×4): qty 1

## 2017-11-08 NOTE — Progress Notes (Deleted)
done

## 2017-11-08 NOTE — Progress Notes (Signed)
PT Cancellation Note  Patient Details Name: [name] [name] MRN: [id number] DOB: 1945/12/03   Cancelled Treatment:    Reason Eval/Treat Not Completed: Patient at procedure or test/unavailable. Pt waiting on bil LE Un[place] boot dressings. Will check back as schedule allows.    Weston Anna, MPT Pager: [phone]

## 2017-11-08 NOTE — Evaluation (Signed)
Physical Therapy Evaluation Patient Details Name: [name] [name] MRN: [id number] DOB: November 13, 1945 Today's Date: 11/08/2017   History of Present Illness  72 yo female admitted with AKI, weakness, infected venous stasis ulcers. Hx of DM, HF, SVT, OA, chronic lymphedema, CHF, aortic stenosis  Clinical Impression  On eval, pt was Min guard assist for mobility. She walked ~125 feet with a rollator. Pt rests both forearms on walker handles for support due to back pain from stenosis. She c/o 9/10 bil LE pain during ambulation. Pt reports that she has been ambulating to/from bathroom, unassisted, with her rollator. Discussed d/c plan-pt plans to return home. She declines HHPT f/u and prefers to have a trial of OP PT. Will follow and progress activity as tolerated.     Follow Up Recommendations Outpatient PT;Supervision - Intermittent(pt prefers to have a trial of OP PT-she already has spoken with them about an appt)    Equipment Recommendations  None recommended by PT    Recommendations for Other Services       Precautions / Restrictions Precautions Precautions: Fall Restrictions Weight Bearing Restrictions: No      Mobility  Bed Mobility Overal bed mobility: Modified Independent             General bed mobility comments: HOB elevated; used rails  Transfers Overall transfer level: Needs assistance Equipment used: 4-wheeled walker Transfers: Sit to/from Stand Sit to Stand: Min guard;From elevated surface         General transfer comment: close guard for safety. Pt leans forearms onto walker to pull up into standing.   Ambulation/Gait Ambulation/Gait assistance: Min guard Gait Distance (Feet): 125 Feet Assistive device: 4-wheeled walker Gait Pattern/deviations: Decreased stride length     General Gait Details: close guard for safety. Pt c/o bil leg pain with ambulation. Pt maintains a flexed trunk position with bil forearms resting on RW handles due to chronic back  pain/stenosis.   Stairs            Wheelchair Mobility    Modified Rankin (Stroke Patients Only)       Balance Overall balance assessment: Needs assistance         Standing balance support: Bilateral upper extremity supported Standing balance-Leahy Scale: Poor                               Pertinent Vitals/Pain Pain Assessment: 0-10 Pain Score: 9  Pain Location: bil LEs Pain Descriptors / Indicators: Sharp;Shooting Pain Intervention(s): Monitored during session    Home Living Family/patient expects to be discharged to:: Private residence Living Arrangements: Alone Available Help at Discharge: Family;Available PRN/intermittently Type of Home: Apartment Home Access: Level entry     Home Layout: One level Home Equipment: Walker - 4 wheels;Tub bench;Walker - 2 wheels;Cane - single point      Prior Function Level of Independence: Independent with assistive device(s)         Comments: Hydrographic surveyor with Rollator RW; uses [employer]     Hand Dominance        Extremity/Trunk Assessment   Upper Extremity Assessment Upper Extremity Assessment: Generalized weakness    Lower Extremity Assessment Lower Extremity Assessment: Generalized weakness    Cervical / Trunk Assessment Cervical / Trunk Assessment: Kyphotic  Communication   Communication: No difficulties  Cognition Arousal/Alertness: Awake/alert Behavior During Therapy: WFL for tasks assessed/performed Overall Cognitive Status: Within Functional Limits for tasks assessed  General Comments      Exercises     Assessment/Plan    PT Assessment Patient needs continued PT services  PT Problem List Decreased strength;Decreased range of motion;Decreased activity tolerance;Decreased balance;Decreased mobility;Decreased knowledge of use of DME;Decreased knowledge of precautions;Obesity       PT Treatment Interventions DME  instruction;Functional mobility training;Therapeutic activities;Balance training;Patient/family education;Therapeutic exercise;Gait training    PT Goals (Current goals can be found in the Care Plan section)  Acute Rehab PT Goals Patient Stated Goal: back to independence PT Goal Formulation: With patient Time For Goal Achievement: 11/22/17 Potential to Achieve Goals: Fair    Frequency Min 3X/week   Barriers to discharge Decreased caregiver support      Co-evaluation               AM-PAC PT "6 Clicks" Daily Activity  Outcome Measure Difficulty turning over in bed (including adjusting bedclothes, sheets and blankets)?: A Little Difficulty moving from lying on back to sitting on the side of the bed? : A Little   Help needed moving to and from a bed to chair (including a wheelchair)?: A Little Help needed walking in hospital room?: A Little Help needed climbing 3-5 steps with a railing? : Total 6 Click Score: 13    End of Session   Activity Tolerance: Patient limited by pain Patient left: in bed;with call bell/phone within reach   PT Visit Diagnosis: Unsteadiness on feet (R26.81);Muscle weakness (generalized) (M62.81);Other abnormalities of gait and mobility (R26.89)    Time: 4103-0131 PT Time Calculation (min) (ACUTE ONLY): 13 min   Charges:   PT Evaluation $PT Eval Moderate Complexity: 1 Mod            Weston Anna, MPT Pager: [phone]

## 2017-11-08 NOTE — Progress Notes (Addendum)
[facility] PROGRESS NOTE    Progress Note  [name] [name]  SHF:[id number] DOB: [birth year] DOA: 11/06/2017 PCP: Hoyt Koch, MD     Brief Narrative:   [name] [name] is an 72 y.o. female past medical history of diabetes mellitus type 2, chronic diastolic heart failure with an EF of 60%, chronic lymphedema, hypertension who presents with decreased appetite  Assessment/Plan:   Acute kidney injury on chronic kidney disease stage III/hyperkalemia/non-anion gap metabolic acidosis: Pre-renal in etiology likely due to decreased oral intake and probably infectious etiology. Improved with IV fluid hydration. Baseline creatinine of 1.  Non-anion gap metabolic acidosis is now improved.  Infected venous stasis wer extremity ulcers and cellulitis She has defervesced Get a wound care consult, she has had a fever, with an oozing purulent left venous stasis ulcer,  Continue oral doxy. Wound care was consulted and recommended Unna boots, will arrange home health to be changed twice a week.  Essential HTN (hypertension): Continue metoprolol.  DM type 2 (diabetes mellitus, type 2) ([place]) Hold metformin agree with sliding scale insulin.  Microcytic anemia Panel is pending.  Thrombocytosis ([place]) I think is reactive in the setting of microcytic anemia concern about iron deficiency versus infectious etiology.  Lymphedema: Follow-up with wound care clinic as an outpatient.  Obesity with a BMI of 36   DVT prophylaxis: lovenox Family Communication:none Disposition Plan/Barrier to D/C: home in 2 days Code Status:     Code Status Orders  (From admission, onward)        Start     Ordered   11/06/17 1745  Full code  Continuous     11/06/17 1744    Code Status History    Date Active Date Inactive Code Status Order ID Comments User Context   10/16/2017 2045 10/18/2017 2053 Full Code [id number]  Florencia Reasons, MD Inpatient   11/29/2013 1307 11/30/2013 1523 Full Code [id number]   Evans Lance, MD Inpatient   11/23/2013 1346 11/29/2013 1138 Full Code [id number]  Flora Lipps Inpatient   11/17/2013 1451 11/23/2013 1346 Full Code [id number]  Oswald Hillock, MD Inpatient        IV Access:    Peripheral IV   Procedures and diagnostic studies:   Dg Abd 1 View  Result Date: 11/06/2017 CLINICAL DATA:  Constipation. EXAM: ABDOMEN - 1 VIEW COMPARISON:  Abdominal CT 10/16/2017 FINDINGS: Left lateral abdomen excluded from the field of view on both images submitted. Small volume of stool in the ascending colon without increased stool burden to suggest constipation. Air-filled rectum and distal sigmoid colon with small volume of stool. Hyperdensities in the pelvis may represent combination of retained barium from prior radiologic studies within colonic diverticula in phleboliths. No evidence of free air. No bowel dilatation to suggest obstruction. Degenerative change in the left hip. IMPRESSION: Small volume of colonic stool. No increased stool burden to suggest constipation. No bowel obstruction. Electronically Signed   By: Jeb Levering M.D.   On: 11/06/2017 21:19     Medical Consultants:    None.  Anti-Infectives:   **IV doxycycline  Subjective:    [name] [name] is no new complaints.  Objective:    Vitals:   11/07/17 1231 11/07/17 1239 11/07/17 2023 11/08/17 0506  BP: (!) 99/59 (!) 100/54 (!) 98/48 (!) 106/58  Pulse: 66  69 72  Resp: 16  18 18   Temp:  98.6 F (37 C) 99.4 F (37.4 C) 98.1 F (36.7 C)  TempSrc:  Oral Oral Oral  SpO2:  99%  98% 100%  Weight:    121.5 kg (267 lb 13.7 oz)  Height:        Intake/Output Summary (Last 24 hours) at 11/08/2017 0841 Last data filed at 11/08/2017 0600 Gross per 24 hour  Intake 63.18 ml  Output -  Net 63.18 ml   Filed Weights   11/06/17 1714 11/07/17 0527 11/08/17 0506  Weight: 120.4 kg (265 lb 6.4 oz) 121 kg (266 lb 12.1 oz) 121.5 kg (267 lb 13.7 oz)    Exam: General exam: In no acute  distress. Respiratory system: Good air movement and clear to auscultation. Cardiovascular system: S1 & S2 heard, RRR.  Gastrointestinal system: Abdomen is nondistended, soft and nontender.  Central nervous system: Alert and oriented. No focal neurological deficits. Extremities: Lymphedema of the left lower extremity has venous stasis ulcers which are probably infected with purulent material foul-smelling Skin: No rashes, lesions or ulcers Psychiatry: Judgement and insight appear normal. Mood & affect appropriate.    Data Reviewed:    Labs: Basic Metabolic Panel: Recent Labs  Lab 11/06/17 1139 11/07/17 0514  NA 133* 135  K 5.5* 5.2*  CL 103 108  CO2 17* 21*  GLUCOSE 122* 101*  BUN 58* 48*  CREATININE 2.60* 1.82*  CALCIUM 9.1 8.0*  MG  --  2.0   GFR Estimated Creatinine Clearance: 40.8 mL/min (A) (by C-G formula based on SCr of 1.82 mg/dL (H)). Liver Function Tests: Recent Labs  Lab 11/06/17 1139 11/07/17 0514  AST 23 16  ALT 12 9  ALKPHOS 73 57  BILITOT 0.5 0.3  PROT 8.7* 5.9*  ALBUMIN 3.1* 2.2*   Recent Labs  Lab 11/06/17 1139  LIPASE 28   No results for input(s): AMMONIA in the last 168 hours. Coagulation profile No results for input(s): INR, PROTIME in the last 168 hours.  CBC: Recent Labs  Lab 11/06/17 1139 11/07/17 0514  WBC 8.5 5.4  NEUTROABS 6.7  --   HGB 9.8* 7.2*  HCT 31.2* 22.3*  MCV 75.7* 75.3*  PLT 528* 432*   Cardiac Enzymes: No results for input(s): CKTOTAL, CKMB, CKMBINDEX, TROPONINI in the last 168 hours. BNP (last 3 results) No results for input(s): PROBNP in the last 8760 hours. CBG: No results for input(s): GLUCAP in the last 168 hours. D-Dimer: No results for input(s): DDIMER in the last 72 hours. Hgb A1c: No results for input(s): HGBA1C in the last 72 hours. Lipid Profile: No results for input(s): CHOL, HDL, LDLCALC, TRIG, CHOLHDL, LDLDIRECT in the last 72 hours. Thyroid function studies: No results for input(s): TSH,  T4TOTAL, T3FREE, THYROIDAB in the last 72 hours.  Invalid input(s): FREET3 Anemia work up: No results for input(s): VITAMINB12, FOLATE, FERRITIN, TIBC, IRON, RETICCTPCT in the last 72 hours. Sepsis Labs: Recent Labs  Lab 11/06/17 1139 11/07/17 0514  WBC 8.5 5.4   Microbiology No results found for this or any previous visit (from the past 240 hour(s)).   Medications:   . aspirin EC  81 mg Oral Q breakfast  . feeding supplement (ENSURE ENLIVE)  237 mL Oral BID BM  . heparin  5,000 Units Subcutaneous Q8H  . metoprolol tartrate  50 mg Oral BID  . mirtazapine  7.5 mg Oral QHS  . montelukast  10 mg Oral Daily  . polyethylene glycol  17 g Oral Daily   Continuous Infusions: . doxycycline (VIBRAMYCIN) IV Stopped (11/08/17 0154)     LOS: 1 day   [facility] Pager [phone]  *  Please refer to [email or web address], password [email or web address] to get updated schedule on who will round on this patient, as hospitalists switch teams weekly. If 7PM-7AM, please contact night-coverage at [email or web address], password [email or web address] for any overnight needs.  11/08/2017, 8:41 AM

## 2017-11-08 NOTE — Progress Notes (Signed)
Initial Nutrition Assessment  DOCUMENTATION CODES:   Obesity unspecified  INTERVENTION:    Ensure Enlive po BID, each supplement provides 350 kcal and 20 grams of protein  Juven Fruit Punch BID, each serving provides 95kcal and 2.5g of protein (amino acids glutamine and arginine)  NUTRITION DIAGNOSIS:   Moderate Malnutrition related to acute illness(nausea, vomiting, consitipation) as evidenced by energy intake < or equal to 75% for > or equal to 1 month, mild fat depletion, moderate fat depletion, moderate muscle depletion.  GOAL:   Patient will meet greater than or equal to 90% of their needs  MONITOR:   PO intake, Supplement acceptance, Weight trends, Labs  REASON FOR ASSESSMENT:   Malnutrition Screening Tool    ASSESSMENT:   Patient with PMH significant for CKD III, DM, CHF, HTN, and chronic lymphedema. Presents this admission with complaints of decreased appetite. Admitted for AKI on CKD III likely related to poor oral intake and infected bilateral lower extremity venous ulcers.    Pt endorses a decrease in PO intake since the end of June after being admitted to [facility] for intractable nausea/vomiting. During this time period she would eat one meal every other day that consisted of fish, vegetables, and soups. Reports she would smell food and immediatly become nauseous. She claims her urine output and BMs decreased significantly. She has not had a BM since 7/29 (bowel regimen in place at this time). Pt attempted to eat eggs, grits, and toast this morning. She finished 50% and denies any associated nausea. Discussed the importance of protein intake for preservation of lean body mass and to aid with wound healing. Pt amendable to Ensure and Juven.   Pt reports a UBW of 300 lb with the last time being at that weight at the end of June. Records indicate pt weighed 270 lb 10/17/17 and 267 lb this admission. Suspect pt has lost a significant amount of weight but it is hard to determine  how much is dry wt loss with CHF history. Nutrition-Focused physical exam completed.   Medications reviewed and include: Remeron, Miralax Labs reviewed.   NUTRITION - FOCUSED PHYSICAL EXAM:    Most Recent Value  Orbital Region  Mild depletion  Upper Arm Region  No depletion  Thoracic and Lumbar Region  No depletion  Buccal Region  Moderate depletion  Temple Region  Mild depletion  Clavicle Bone Region  Moderate depletion  Clavicle and Acromion Bone Region  Moderate depletion  Scapular Bone Region  Mild depletion  Dorsal Hand  No depletion  Patellar Region  No depletion  Anterior Thigh Region  No depletion  Posterior Calf Region  No depletion  Edema (RD Assessment)  Mild  Hair  Reviewed  Eyes  Reviewed  Mouth  Reviewed  Skin  Reviewed  Nails  Reviewed     Diet Order:   Diet Order           Diet Carb Modified Fluid consistency: Thin; Room service appropriate? Yes  Diet effective now          EDUCATION NEEDS:   Education needs have been addressed  Skin:  Skin Assessment: Skin Integrity Issues: Skin Integrity Issues:: Diabetic Ulcer Diabetic Ulcer: right leg/left leg  Last BM:  11/01/17- consitpated   Height:   Ht Readings from Last 1 Encounters:  11/06/17 5\' 11"  (1.803 m)    Weight:   Wt Readings from Last 1 Encounters:  11/08/17 267 lb 13.7 oz (121.5 kg)    Ideal Body Weight:  70.5  kg  BMI:  Body mass index is 37.36 kg/m.  Estimated Nutritional Needs:   Kcal:  1850-2050 kcal  Protein:  90-100 grams   Fluid:  >/= 1.8 L/day    Mariana Single RD, LDN Clinical Nutrition Pager # - [phone]

## 2017-11-08 NOTE — Consult Note (Signed)
[place] Nurse wound consult note Reason for Consult: lymphedema However upon assessment today she presents with combination venous stasis and possible lymphedema  Patient reports she has an appointment with the wound care center here in [facility] on Aug.20th She has been followed by the [facility] in the past. She has compression pumps at home which she reports she does not use any longer. She has compression stockings in her home which she reports she does not use any longer. She reports "someone came to her house" to wrap her legs but based on the description it does not sound as if they were using compression wraps.  I have explained to the patient the neccessity of long term compression to keep her legs ulcer free. She has several ruddy, large venous ulcerations on each of her legs.  Wound type: venous stasis ulcerations bilateral LE Pressure Injury POA: NA Measurement: Left lateral: 9cm x 14cm x 0.2cm  Left medial: 3cm x 2.5cm x 0.2cm  Left lateral distal 5cm x 3cm x 0.2cm  Right lateral: 2cm x 3cm x 0.2cm  Right lateral: 3cm x 2cm x 0.2cm  Right lateral: superficial scattered skin loss proximal to wound on the right lateral calf Wound bed: bleeding and ruddy.  Drainage (amount, consistency, odor) bloody with musty odor (typical odor related to venous ulcerations) Periwound: evidence of re-epithelize skin, she reports she has had some ulcers in the past. Dressing procedure/placement/frequency: Apply silver hydrofiber (Aquacel Ag+) to the open wounds Apply Unna's boots bilaterally, per orthopedic tech. To be changed initially 2x week due to the severity of her ulcerations.   Follow up in the wound care center as scheduled  Will need HHRN for topical wound care for the venous ulcerations and for 2x week Unna's boot application.  Discussed POC with patient and bedside nurse.  Re consult if needed, will not follow at this time. Thanks  Hebe Merriwether [employer], RN,CWOCN, CNS, [place] [phone])

## 2017-11-09 DIAGNOSIS — N183 Chronic kidney disease, stage 3 (moderate): Secondary | ICD-10-CM

## 2017-11-09 LAB — BASIC METABOLIC PANEL
Anion gap: 6 (ref 5–15)
BUN: 26 mg/dL — ABNORMAL HIGH (ref 8–23)
CO2: 23 mmol/L (ref 22–32)
Calcium: 8.1 mg/dL — ABNORMAL LOW (ref 8.9–10.3)
Chloride: 110 mmol/L (ref 98–111)
Creatinine, Ser: 0.99 mg/dL (ref 0.44–1.00)
GFR calc Af Amer: >60 mL/min (ref >60)
GFR calc non Af Amer: 56 mL/min — ABNORMAL LOW (ref >60)
Glucose, Bld: 93 mg/dL (ref 70–99)
Potassium: 4.9 mmol/L (ref 3.5–5.1)
Sodium: 139 mmol/L (ref 135–145)

## 2017-11-09 MED ORDER — POLYETHYLENE GLYCOL 3350 17 G PO PACK: 17.0000 g | PACK | Freq: Every day | ORAL | Status: DC

## 2017-11-09 MED ORDER — FOLIC ACID 1 MG PO TABS
1.0000 mg | ORAL_TABLET | Freq: Every day | ORAL | Status: DC
  Administered 2017-11-09: 1 mg via ORAL
  Filled 2017-11-09: qty 1

## 2017-11-09 MED ORDER — FOLIC ACID 1 MG PO TABS: 1.0000 mg | ORAL_TABLET | Freq: Every day | ORAL | 3 refills | Status: DC

## 2017-11-09 MED ORDER — POLYETHYLENE GLYCOL 3350 17 G PO PACK: 17.0000 g | PACK | Freq: Every day | ORAL | 0 refills | Status: DC

## 2017-11-09 MED ORDER — FERROUS SULFATE 325 (65 FE) MG PO TABS: 325.0000 mg | ORAL_TABLET | Freq: Every day | ORAL | 3 refills | Status: DC

## 2017-11-09 MED ORDER — DOXYCYCLINE HYCLATE 100 MG PO TABS: 100.0000 mg | ORAL_TABLET | Freq: Two times a day (BID) | ORAL | 0 refills | Status: AC

## 2017-11-09 NOTE — Progress Notes (Signed)
Patient is stable for discharge. Discharge instructions and medications have been reviewed with the patient and all questions answered. AVS and prescriptions given to patient.  

## 2017-11-09 NOTE — Progress Notes (Signed)
PT Cancellation Note  Patient Details Name: Koralynn Greenspan MRN: [id number] DOB: 11/21/1945   Cancelled Treatment:    Reason Eval/Treat Not Completed: Attempted PT tx session-pt declined to participate with therapy on today. She stated she is set to d/c home later today.    [place] Anna, MPT Pager: [phone]

## 2017-11-09 NOTE — Care Management Note (Signed)
Case Management Note  Patient Details  Name: Stephene Alegria MRN: [id number] Date of Birth: 1946/01/28  Subjective/Objective: dc home [place] rep Santiago Glad aware of d/c & HHC orders. No further CM needs.                   Action/Plan:d/c home w/HHC.   Expected Discharge Date:  11/09/17               Expected Discharge Plan:     In-House Referral:     Discharge planning Services     Post Acute Care Choice:  Home Health(Active w/[facility]-HHRN) Choice offered to:  Patient  DME Arranged:    DME Agency:     HH Arranged:  RN, PT, OT, Nurse's Aide [place] Agency:  [place]  Status of Service:  Completed, signed off  If discussed at [place] of Stay Meetings, dates discussed:    Additional Comments:  Dessa Phi, RN 11/09/2017, 8:56 AM

## 2017-11-09 NOTE — Discharge Summary (Signed)
Physician Discharge Summary  [name] [name] WCH:[id number] DOB: 07/17/1945 DOA: 11/06/2017  PCP: Hoyt Koch, MD  Admit date: 11/06/2017 Discharge date: 11/09/2017  Admitted From: home Disposition:  Home  Recommendations for Outpatient Follow-up:  1. Follow up with PCP in 1-2 weeks 2. Follow-up with wound care clinic in 2 weeks.  Home Health:No Equipment/Devices:None  Discharge Condition:stable CODE STATUS:full Diet recommendation: Heart Healthy   Brief/Interim Summary: 72 year old with past medical history of diabetes mellitus type 2, chronic heart failure with an EF of 60% chronic venous stasis who presents with decreased appetite and weakness.  Discharge Diagnoses:  Active Problems:   Venous stasis of lower extremity   HTN (hypertension)   DM type 2 (diabetes mellitus, type 2) (HCC)   Generalized weakness   Cellulitis of left lower extremity   AKI (acute kidney injury) ([place])   Hyperkalemia   Microcytic anemia   Thrombocytosis (HCC)   Lymphedema   Normal anion gap metabolic acidosis   Cellulitis of left lower leg   Obesity, Class II, BMI 35-39.9, isolated (see actual BMI)   CKD (chronic kidney disease), stage III (HCC)  Acute kidney injury on chronic renal disease stage III/hyperkalemia/non-anion gap metabolic acidosis: Likely prerenal in etiology likely due to decreased oral intake in the setting of infectious etiology. She was started on IV fluid and her creatinine returned to baseline.  Infected venous stasis ulcers with cellulitis: She was started empirically on IV doxycycline, blood cultures were negative. Wound care was consulted who recommended Unna boots to be changed twice a day home health for follow-up as an outpatient. He will continue oral doxycycline for 6 additional days.  Essential hypertension: No changes were made to her medication.  Diabetes mellitus type 2: No changes were made to her medication.  Microcytic anemia: Ferritin was 56  folate was low she was started on ferrous sulfate 3 times daily, she also started on oral folate. She was started on prophylaxis MiraLAX for constipation.  Thrombocytosis: Setting of microcytic anemia we will have to recheck in 6 weeks.  Obesity with a BMI of 36.  Discharge Instructions  Discharge Instructions    Diet - low sodium heart healthy   Complete by:  As directed    Increase activity slowly   Complete by:  As directed      Allergies as of 11/09/2017      Reactions   Oxycodone Hcl Nausea And Vomiting   Penicillins Itching   Has patient had a PCN reaction causing immediate rash, facial/tongue/throat swelling, SOB or lightheadedness with hypotension: No Has patient had a PCN reaction causing severe rash involving mucus membranes or skin necrosis: No Has patient had a PCN reaction that required hospitalization: No Has patient had a PCN reaction occurring within the last 10 years: No If all of the above answers are "NO", then may proceed with Cephalosporin use.      Medication List    TAKE these medications   acetaminophen 325 MG tablet Commonly known as:  TYLENOL Take 2 tablets (650 mg total) by mouth every 6 (six) hours as needed for mild pain (or Fever >/= 101).   aspirin EC 81 MG tablet Take 81 mg by mouth daily with breakfast. Takes once or twice weekly   [employer] ANTIBIOTIC [id number] Oint APPLY AFTER CLEANING BEFORE WRAPPING LGS   doxycycline 100 MG tablet Commonly known as:  VIBRA-TABS Take 1 tablet (100 mg total) by mouth every 12 (twelve) hours for 10 days.   ferrous sulfate 325 (65 FE)  MG tablet Take 1 tablet (325 mg total) by mouth daily.   folic acid 1 MG tablet Commonly known as:  FOLVITE Take 1 tablet (1 mg total) by mouth daily.   folic acid 1 MG tablet Commonly known as:  FOLVITE Take 1 tablet (1 mg total) by mouth daily.   losartan 100 MG tablet Commonly known as:  COZAAR Take 1 tablet (100 mg total) by mouth daily.   metFORMIN 500 MG  tablet Commonly known as:  GLUCOPHAGE TAKE 1 TABLET BY MOUTH DAILY WITH BREAKFAST.   methocarbamol 500 MG tablet Commonly known as:  ROBAXIN Take 1 tablet (500 mg total) by mouth every 6 (six) hours as needed for muscle spasms.   metoprolol tartrate 50 MG tablet Commonly known as:  LOPRESSOR Take 1 tablet (50 mg total) by mouth 2 (two) times daily.   montelukast 10 MG tablet Commonly known as:  SINGULAIR Take 10 mg by mouth daily.   ondansetron 4 MG tablet Commonly known as:  ZOFRAN Take 1 tablet (4 mg total) by mouth every 8 (eight) hours as needed for nausea or vomiting.   polyethylene glycol packet Commonly known as:  MIRALAX / GLYCOLAX Take 17 g by mouth daily.   promethazine 25 MG tablet Commonly known as:  PHENERGAN Take 1 tablet (25 mg total) by mouth every 8 (eight) hours as needed for nausea or vomiting.      Follow-up Information    [facility] Follow up.   Specialty:  Home Health Services Why:  [facility] nursing/physical/occupational therapy/aide Contact information: [place] [address] [phone]          Allergies  Allergen Reactions  . Oxycodone Hcl Nausea And Vomiting  . Penicillins Itching    Has patient had a PCN reaction causing immediate rash, facial/tongue/throat swelling, SOB or lightheadedness with hypotension: No Has patient had a PCN reaction causing severe rash involving mucus membranes or skin necrosis: No Has patient had a PCN reaction that required hospitalization: No Has patient had a PCN reaction occurring within the last 10 years: No If all of the above answers are "NO", then may proceed with Cephalosporin use.    Consultations:  None   Procedures/Studies: Ct Abdomen Pelvis Wo Contrast  Result Date: 10/16/2017 CLINICAL DATA:  Nausea, vomiting, and diarrhea for the last month. Denies pain EXAM: CT ABDOMEN AND PELVIS WITHOUT CONTRAST TECHNIQUE: Multidetector CT imaging of the abdomen and  pelvis was performed following the standard protocol without IV contrast. COMPARISON:  CT of the abdomen and pelvis on 12/09/2013 FINDINGS: Lower chest: The lung bases are unremarkable.  Heart size is normal. Hepatobiliary: No focal liver abnormality is seen. No radiopaque gallstones, biliary dilatation, or pericholecystic inflammatory changes. Pancreas: Unremarkable. No pancreatic ductal dilatation or surrounding inflammatory changes. Spleen: Normal in size without focal abnormality. Adrenals/Urinary Tract: Adrenal glands are normal in appearance. A low-attenuation lesion in the UPPER pole region of the LEFT kidney measures 2.6 centimeters and appears stable. There is no hydronephrosis. No intrarenal calculi. Urinary bladder is unremarkable. Stomach/Bowel: The stomach and small bowel loops are normal in appearance. Numerous colonic diverticula without associated diverticulitis. A small fat containing lesion is identified adjacent to the LOWER sigmoid colon, similar in appearance to a lesion seen in the central abdomen on previous study. This may represent a focal area of fat necrosis or other benign lesion. Vascular/Lymphatic: No significant vascular findings are present. No enlarged abdominal or pelvic lymph nodes. Reproductive: The uterus is present. No  adnexal mass. Significant streak artifact in the pelvis. Other: No free pelvic fluid.  Anterior abdominal wall Musculoskeletal: Degenerative changes are seen in the LOWER thoracic and lumbar spine. IMPRESSION: 1. No bowel obstruction. 2. Colonic diverticulosis without acute diverticulitis. 3. Stable appearance of LEFT renal cyst. 4. Small benign fatty mass adjacent to the rectum, likely representing an area of fat necrosis or appendagitis. Electronically Signed   By: Nolon Nations M.D.   On: 10/16/2017 16:24   Dg Chest 2 View  Result Date: 10/16/2017 CLINICAL DATA:  Generalized weakness.  Dehydration. EXAM: CHEST - 2 VIEW COMPARISON:  November 17, 2013  FINDINGS: The heart size and mediastinal contours are within normal limits. Both lungs are clear. The visualized skeletal structures are unremarkable. IMPRESSION: No active cardiopulmonary disease. Electronically Signed   By: Dorise Bullion III M.D   On: 10/16/2017 11:32   Dg Abd 1 View  Result Date: 11/06/2017 CLINICAL DATA:  Constipation. EXAM: ABDOMEN - 1 VIEW COMPARISON:  Abdominal CT 10/16/2017 FINDINGS: Left lateral abdomen excluded from the field of view on both images submitted. Small volume of stool in the ascending colon without increased stool burden to suggest constipation. Air-filled rectum and distal sigmoid colon with small volume of stool. Hyperdensities in the pelvis may represent combination of retained barium from prior radiologic studies within colonic diverticula in phleboliths. No evidence of free air. No bowel dilatation to suggest obstruction. Degenerative change in the left hip. IMPRESSION: Small volume of colonic stool. No increased stool burden to suggest constipation. No bowel obstruction. Electronically Signed   By: Jeb Levering M.D.   On: 11/06/2017 21:19   Ct Head Wo Contrast  Result Date: 10/16/2017 CLINICAL DATA:  Nausea, vomiting, and diarrhea for the last month. Also having Dizziness. Denies pain EXAM: CT HEAD WITHOUT CONTRAST TECHNIQUE: Contiguous axial images were obtained from the base of the skull through the vertex without intravenous contrast. COMPARISON:  None. FINDINGS: Brain: No evidence of acute infarction, hemorrhage, hydrocephalus, extra-axial collection or mass lesion/mass effect. Vascular: No hyperdense vessel or unexpected calcification. Skull: Normal. Negative for fracture or focal lesion. Sinuses/Orbits: Normal globes and orbits. Mild maxillary sinus mucosal thickening. Remaining visualized sinuses and mastoid air cells are clear. Other: None. IMPRESSION: 1. No intracranial abnormality. Electronically Signed   By: Lajean Manes M.D.   On: 10/16/2017  16:16   Ct Chest Wo Contrast  Result Date: 10/17/2017 CLINICAL DATA:  Patient mistakenly scanned. Previous day's chest radiograph had an indication of generalized weakness and dehydration. EXAM: CT CHEST WITHOUT CONTRAST TECHNIQUE: Multidetector CT imaging of the chest was performed following the standard protocol without IV contrast. COMPARISON:  Chest radiograph, 10/16/2017.  Chest CT, 08/02/2013. FINDINGS: Cardiovascular: Heart normal in size and configuration. No pericardial effusion. Mild left and right coronary artery calcifications. Great vessels normal in caliber. No significant aortic atherosclerosis Mediastinum/Nodes: No enlarged mediastinal or axillary lymph nodes. Thyroid gland, trachea, and esophagus demonstrate no significant findings. Lungs/Pleura: Lungs are clear. No pleural effusion or pneumothorax. Upper Abdomen: No acute abnormality. Musculoskeletal: No fracture or acute finding. No osteoblastic or osteolytic lesions. IMPRESSION: 1. No acute findings.  Lungs are clear. Electronically Signed   By: Lajean Manes M.D.   On: 10/17/2017 15:42   Dg Esophagus  Result Date: 10/18/2017 CLINICAL DATA:  Nausea, vomiting, difficulty swallowing EXAM: ESOPHOGRAM/BARIUM SWALLOW TECHNIQUE: Single contrast examination was performed using  thin barium. FLUOROSCOPY TIME:  Fluoroscopy Time:  2 minutes Radiation Exposure Index (if provided by the fluoroscopic device): Number of Acquired Spot  Images: 0 COMPARISON:  None. FINDINGS: Fluoroscopic evaluation of swallowing demonstrates disruption of primary esophageal peristaltic waves. No visible fixed stricture. No reflux with the water siphon maneuver. The 13 mm barium tablet sits in the distal esophagus despite lack of visible stricture. This does not reproduce the patient's symptoms. IMPRESSION: No visible fixed stricture. Esophageal dysmotility. Electronically Signed   By: Rolm Baptise M.D.   On: 10/18/2017 13:42    Subjective: No complains  Discharge  Exam: Vitals:   11/09/17 0511 11/09/17 0952  BP: (!) 99/54 (!) 103/56  Pulse: 71 79  Resp: 18 18  Temp: 99.5 F (37.5 C) 98.3 F (36.8 C)  SpO2: 96% 99%   Vitals:   11/08/17 2109 11/09/17 0511 11/09/17 0521 11/09/17 0952  BP: 105/66 (!) 99/54  (!) 103/56  Pulse: 82 71  79  Resp: 16 18  18   Temp: 98.3 F (36.8 C) 99.5 F (37.5 C)  98.3 F (36.8 C)  TempSrc:    Oral  SpO2: 99% 96%  99%  Weight:   124.8 kg (275 lb 2.2 oz)   Height:        General: Pt is alert, awake, not in acute distress Cardiovascular: RRR, S1/S2 +, no rubs, no gallops Respiratory: CTA bilaterally, no wheezing, no rhonchi Abdominal: Soft, NT, ND, bowel sounds + Extremities: no edema, no cyanosis    The results of significant diagnostics from this hospitalization (including imaging, microbiology, ancillary and laboratory) are listed below for reference.     Microbiology: No results found for this or any previous visit (from the past 240 hour(s)).   Labs: BNP (last 3 results) No results for input(s): BNP in the last 8760 hours. Basic Metabolic Panel: Recent Labs  Lab 11/06/17 1139 11/07/17 0514 11/08/17 0857 11/09/17 0504  NA 133* 135 140 139  K 5.5* 5.2* 4.1 4.9  CL 103 108 111 110  CO2 17* 21* 24 23  GLUCOSE 122* 101* 106* 93  BUN 58* 48* 26* 26*  CREATININE 2.60* 1.82* 1.09* 0.99  CALCIUM 9.1 8.0* 8.2* 8.1*  MG  --  2.0  --   --    Liver Function Tests: Recent Labs  Lab 11/06/17 1139 11/07/17 0514  AST 23 16  ALT 12 9  ALKPHOS 73 57  BILITOT 0.5 0.3  PROT 8.7* 5.9*  ALBUMIN 3.1* 2.2*   Recent Labs  Lab 11/06/17 1139  LIPASE 28   No results for input(s): AMMONIA in the last 168 hours. CBC: Recent Labs  Lab 11/06/17 1139 11/07/17 0514 11/08/17 0857  WBC 8.5 5.4 5.6  NEUTROABS 6.7  --  3.2  HGB 9.8* 7.2* 8.3*  HCT 31.2* 22.3* 26.2*  MCV 75.7* 75.3* 76.8*  PLT 528* 432* 371   Cardiac Enzymes: No results for input(s): CKTOTAL, CKMB, CKMBINDEX, TROPONINI in the  last 168 hours. BNP: Invalid input(s): POCBNP CBG: No results for input(s): GLUCAP in the last 168 hours. D-Dimer No results for input(s): DDIMER in the last 72 hours. Hgb A1c No results for input(s): HGBA1C in the last 72 hours. Lipid Profile No results for input(s): CHOL, HDL, LDLCALC, TRIG, CHOLHDL, LDLDIRECT in the last 72 hours. Thyroid function studies No results for input(s): TSH, T4TOTAL, T3FREE, THYROIDAB in the last 72 hours.  Invalid input(s): FREET3 Anemia work up Recent Labs    11/08/17 0857  VITAMINB12 1,323*  FOLATE 5.9*  FERRITIN 58  TIBC 177*  IRON 29  RETICCTPCT 1.4   Urinalysis    Component Value Date/Time  COLORURINE YELLOW 11/06/2017 2030   APPEARANCEUR CLOUDY (A) 11/06/2017 2030   LABSPEC 1.019 11/06/2017 2030   PHURINE 5.0 11/06/2017 2030   GLUCOSEU NEGATIVE 11/06/2017 2030   [place] NEGATIVE 11/06/2017 2030   [place] NEGATIVE 11/06/2017 2030   BILIRUBINUR Neg 10/22/2017 0950   KETONESUR NEGATIVE 11/06/2017 2030   PROTEINUR NEGATIVE 11/06/2017 2030   UROBILINOGEN 0.2 10/22/2017 0950   UROBILINOGEN 1.0 12/09/2013 2303   NITRITE NEGATIVE 11/06/2017 2030   LEUKOCYTESUR NEGATIVE 11/06/2017 2030   Sepsis Labs Invalid input(s): PROCALCITONIN,  WBC,  LACTICIDVEN Microbiology No results found for this or any previous visit (from the past 240 hour(s)).   Time coordinating discharge: 35 minutes  SIGNED:   Charlynne Cousins, MD  [facility] 11/09/2017, 10:39 AM Pager   If 7PM-7AM, please contact night-coverage [email or web address] Password [email or web address]

## 2017-11-09 NOTE — Progress Notes (Signed)
Spoke to patient about d/c plans-she had concerns with HHC-[facility] that she used in the past-"they only came twice a week to wrap my legs, then they left-[facility] rep has spoken to patient to inform of the process of HHC-patient initially wanted to know why so many people are coming to my house-once rep explained the services-she agrees to the services. [facility] will not be able to provide HHOT-MD notified. No further CM needs.

## 2017-11-10 ENCOUNTER — Telehealth: Payer: Self-pay | Admitting: *Deleted

## 2017-11-10 NOTE — Telephone Encounter (Signed)
Pt was on TCM report tried calling to do TCM call and set up hosp f/u appt. No ansew LMOM RTC.Marland KitchenJohny Chess (sent CRM to [facility] for FYI).Marland KitchenJohny Chess

## 2017-11-11 ENCOUNTER — Encounter: Payer: Self-pay | Admitting: *Deleted

## 2017-11-11 NOTE — Telephone Encounter (Signed)
This encounter was created in error - please disregard.

## 2017-11-11 NOTE — Telephone Encounter (Signed)
Pt return call completed TCM call below.../lmb  Transition Care Management Follow-up Telephone Call   Date discharged? 11/09/17   How have you been since you were released from the hospital? Pt states she is doing ok   Do you understand why you were in the hospital? YES   Do you understand the discharge instructions? YES   Where were you discharged to? Home   Items Reviewed:  Medications reviewed: YES  Allergies reviewed: YES  Dietary changes reviewed: YES, diabetic & heart healthy  Referrals reviewed: YES, will see wound clinic 11/23/17   Functional Questionnaire:   Activities of Daily Living (ADLs):   She states he are independent in the following: bathing and hygiene, feeding, continence, grooming, toileting and dressing States they require assistance with the following: ambulation   Any transportation issues/concerns?: NO   Any patient concerns? NO   Confirmed importance and date/time of follow-up visits scheduled YES, 11/17/17  Provider Appointment booked with Dr. [name]  Confirmed with patient if condition begins to worsen call PCP or go to the ER.  Patient was given the office number and encouraged to call back with question or concerns.  : YES

## 2017-11-12 ENCOUNTER — Other Ambulatory Visit: Payer: Self-pay

## 2017-11-12 NOTE — Patient Outreach (Signed)
[place] [facility]) Care Management  11/12/2017  Kerah Connon [birth year] [id number]  72 year old female outreached by [place] services for a 30 day post discharge medication review.  PMHx includes, but not limited to, hypertension, SVT, GERD, Type 2 diabetes mellitus, arthritis, CKD Stage III and lymphedema.   Unsuccessful outreach attempt #1 to Ms. Reger.  Left HIPAA compliant voice message requesting a return call.  Plan: Outreach attempt #2 in 3-4 business days.  Joetta Manners, PharmD Clinical Pharmacist [place] [phone]

## 2017-11-12 NOTE — Addendum Note (Signed)
Addended by: Joetta Manners D on: 11/12/2017 05:08 PM   Modules accepted: Orders

## 2017-11-12 NOTE — Patient Outreach (Signed)
[facility]) Care Management  11/12/2017  [name] [name] October 25, 1945 [id number]  72 year old female outreached by [facility] services for a 30 day post discharge medication review.  PMHx includes, but not limited to, hypertension, SVT, GERD, Type 2 diabetes mellitus, arthritis, CKD Stage III and lymphedema.   Unsuccessful outreach attempt #1 to [name].  Left HIPAA compliant voice message requesting a return call.  Plan: Outreach attempt #2 in 3-4 business days.  Joetta Manners, PharmD Clinical Pharmacist [place] [phone]  Addendum: Incoming call received from [name].  HIPAA identifiers verified.  Subjective: [name] [name] states that she has not had anyone come to her house for wound care since she was discharged.  She states that the wound is leaky and that she redressed it today herself.  She reports the wound is smelly.  She states that she does not check her glucose daily because she was told that she didn't need a glucometer.  She states that she is waiting for two prescriptions to be called into [facility].  Upon further discussion, the patient has the hard copy of the prescriptions (iron and folic acid) and will take them to the pharmacy tomorrow.  She is currently taking her doxycycline.  Patient reports a rash that she says started after she started taking promethazine (prior to the doxycyline).  She also states she is not taking the montelukast in case that is causing the itching.   Objective:  HgA1c 7.1% 10/11/17 SCr 0.99 mg/dL  Current Medications: Current Outpatient Medications  Medication Sig Dispense Refill  . acetaminophen (TYLENOL) 325 MG tablet Take 2 tablets (650 mg total) by mouth every 6 (six) hours as needed for mild pain (or Fever >/= 101).    Marland Kitchen aspirin EC 81 MG tablet Take 81 mg by mouth daily with breakfast. Takes once or twice weekly    . doxycycline (VIBRA-TABS) 100 MG tablet Take 1 tablet (100 mg total) by mouth  every 12 (twelve) hours for 10 days. 20 tablet 0  . ferrous sulfate 325 (65 FE) MG tablet Take 1 tablet (325 mg total) by mouth daily. 30 tablet 3  . folic acid (FOLVITE) 1 MG tablet Take 1 tablet (1 mg total) by mouth daily. 30 tablet 3  . losartan (COZAAR) 100 MG tablet Take 1 tablet (100 mg total) by mouth daily. 90 tablet 3  . metFORMIN (GLUCOPHAGE) 500 MG tablet TAKE 1 TABLET BY MOUTH DAILY WITH BREAKFAST. 90 tablet 3  . methocarbamol (ROBAXIN) 500 MG tablet Take 1 tablet (500 mg total) by mouth every 6 (six) hours as needed for muscle spasms. 30 tablet 0  . metoprolol tartrate (LOPRESSOR) 50 MG tablet Take 1 tablet (50 mg total) by mouth 2 (two) times daily. 180 tablet 1  . montelukast (SINGULAIR) 10 MG tablet Take 10 mg by mouth daily. Says she is not taking everyday.  3  . Neomycin-Bacitracin-Polymyxin ([facility] ANTIBIOTIC) [phone] OINT APPLY AFTER CLEANING BEFORE WRAPPING LGS (Patient not taking: Reported on 11/12/2017) 453.9 g 0  . ondansetron (ZOFRAN) 4 MG tablet Take 1 tablet (4 mg total) by mouth every 8 (eight) hours as needed for nausea or vomiting. 14 tablet 0  . polyethylene glycol (MIRALAX / GLYCOLAX) packet Take 17 g by mouth daily. (Patient not taking: Reported on 11/12/2017) 14 each 0  . promethazine (PHENERGAN) 25 MG tablet Take 1 tablet (25 mg total) by mouth every 8 (eight) hours as needed for nausea or vomiting. 30 tablet 0   No  current facility-administered medications for this visit.     Functional Status: In your present state of health, do you have any difficulty performing the following activities: 11/06/2017 10/16/2017  Hearing? N N  Vision? N N  Difficulty concentrating or making decisions? N N  Walking or climbing stairs? Y Y  Dressing or bathing? N N  Doing errands, shopping? N N  Preparing Food and eating ? - -  Using the Toilet? - -  In the past six months, have you accidently leaked urine? - -  Do you have problems with loss of bowel control? - -  Managing  your Medications? - -  Managing your Finances? - -  Housekeeping or managing your Housekeeping? - -  Some recent data might be hidden    Fall/Depression Screening: Fall Risk  10/11/2017 09/10/2017 08/02/2017  Falls in the past year? Yes Yes Yes  Number falls in past yr: 1 1 1   Injury with Fall? No No No  Risk Factor Category  - - -  Comment - - -  Risk for fall due to : History of fall(s);Impaired balance/gait;Impaired mobility History of fall(s);Impaired balance/gait;Impaired mobility History of fall(s);Impaired balance/gait;Impaired mobility  Risk for fall due to: Comment - - -  Follow up - Falls evaluation completed;Falls prevention discussed;Education provided Falls evaluation completed;Falls prevention discussed;Education provided   [facility] 2/9 Scores 10/11/2017 09/10/2017 08/02/2017 06/02/2017 05/20/2017 05/11/2017 04/12/2017  PHQ - 2 Score 0 0 0 0 0 0 0  PHQ- 9 Score - - - - - 3 -   ASSESSMENT: Date Discharged from Hospital: 11/09/17 Date Medication Reconciliation Performed: 11/12/2017  New Medications at Discharge:  Doxycycline  Ferrous Sulfate  Folic Acid  Polyethylene glycol  Patient was recently discharged from hospital and all medications have been reviewed  Drugs sorted by system:  Cardiovascular: aspirin, losartan, metoprolol tartrate  Pulmonary/Allergy: montelkast  Gastrointestinal: ondansetron, polyethylene glycol, promethazine  Endocrine: metformin  Topical: neomycin/bacitracin/polymyxin  Pain: acetaminophen  Vitamins/Minerals: ferrous sulfate, folic acid  Infectious Diseases: doxycycline  Miscellaneous: methocarbamol  Duplications in therapy: ondansetron and promethazine for N/V  Recommend to discontinue promethazine.  Gaps in therapy:  Patient with Type 2 diabetes mellitus and not on statin therapy.  Medications to avoid in the elderly:  Per the [employer] List, promethazine is highly anticholinergic and clearance is reduced with advanced age. Risk of confusion,  dry mouth, constipation and other anticholinergic effects or toxicity may occur.  There is strong evidence to avoid use in the elderly.  Per the Beers List, methocarbamol may be poorly tolerated by older adults.  Muscle relaxants may have anticholinergic adverse effects, sedation, increased risk of fractures and effectiveness at dosages tolerated by older adults is questionable.  There is strong evidence to avoid use in the elderly  Other issues noted:  Call placed to [place] and wound care nurse scheduled to make home visit Saturday 8/10 at 0900.    Patient reports a rash/itching that she believes is medication related, but she doesn't know what is the culprit.  Plan:  Route note to PCP, Dr. [name].  Joetta Manners, PharmD Clinical Pharmacist [place] [phone]

## 2017-11-16 DIAGNOSIS — R0982 Postnasal drip: Secondary | ICD-10-CM | POA: Insufficient documentation

## 2017-11-16 DIAGNOSIS — H903 Sensorineural hearing loss, bilateral: Secondary | ICD-10-CM | POA: Insufficient documentation

## 2017-11-16 DIAGNOSIS — H9313 Tinnitus, bilateral: Secondary | ICD-10-CM | POA: Insufficient documentation

## 2017-11-17 ENCOUNTER — Other Ambulatory Visit (INDEPENDENT_AMBULATORY_CARE_PROVIDER_SITE_OTHER): Payer: PPO

## 2017-11-17 ENCOUNTER — Ambulatory Visit: Payer: PPO

## 2017-11-17 ENCOUNTER — Encounter: Payer: Self-pay | Admitting: Internal Medicine

## 2017-11-17 ENCOUNTER — Ambulatory Visit (INDEPENDENT_AMBULATORY_CARE_PROVIDER_SITE_OTHER): Payer: PPO | Admitting: Internal Medicine

## 2017-11-17 VITALS — BP 110/60 | HR 120 | Temp 97.9°F | Ht 71.0 in | Wt 287.0 lb

## 2017-11-17 DIAGNOSIS — R112 Nausea with vomiting, unspecified: Secondary | ICD-10-CM

## 2017-11-17 DIAGNOSIS — N179 Acute kidney failure, unspecified: Secondary | ICD-10-CM | POA: Diagnosis not present

## 2017-11-17 DIAGNOSIS — L03116 Cellulitis of left lower limb: Secondary | ICD-10-CM

## 2017-11-17 DIAGNOSIS — R531 Weakness: Secondary | ICD-10-CM | POA: Diagnosis not present

## 2017-11-17 LAB — COMPREHENSIVE METABOLIC PANEL
ALT: 9 U/L (ref 0–35)
AST: 16 U/L (ref 0–37)
Albumin: 3.3 g/dL — ABNORMAL LOW (ref 3.5–5.2)
Alkaline Phosphatase: 66 U/L (ref 39–117)
BUN: 19 mg/dL (ref 6–23)
CO2: 22 mEq/L (ref 19–32)
Calcium: 9.6 mg/dL (ref 8.4–10.5)
Chloride: 107 mEq/L (ref 96–112)
Creatinine, Ser: 1.14 mg/dL (ref 0.40–1.20)
GFR: 60.41 mL/min (ref >60.00)
Glucose, Bld: 114 mg/dL — ABNORMAL HIGH (ref 70–99)
Potassium: 4.4 mEq/L (ref 3.5–5.1)
Sodium: 139 mEq/L (ref 135–145)
Total Bilirubin: 0.5 mg/dL (ref 0.2–1.2)
Total Protein: 8.1 g/dL (ref 6.0–8.3)

## 2017-11-17 LAB — CBC
HCT: 28.1 % — ABNORMAL LOW (ref 36.0–46.0)
Hemoglobin: 8.9 g/dL — ABNORMAL LOW (ref 12.0–15.0)
MCHC: 31.8 g/dL (ref 30.0–36.0)
MCV: 76.2 fl — ABNORMAL LOW (ref 78.0–100.0)
Platelets: 438 10*3/uL — ABNORMAL HIGH (ref 150.0–400.0)
RBC: 3.69 Mil/uL — ABNORMAL LOW (ref 3.87–5.11)
RDW: 20.1 % — ABNORMAL HIGH (ref 11.5–15.5)
WBC: 12.1 10*3/uL — ABNORMAL HIGH (ref 4.0–10.5)

## 2017-11-17 NOTE — Patient Instructions (Signed)
We will check the labs today.   Keep working on drinking fluids and ensure to help with calories. It is okay to take the iron pill every other day.

## 2017-11-17 NOTE — Progress Notes (Signed)
   Subjective:    Patient ID: [name] [name], female    DOB: 09/03/1945, 72 y.o.   MRN: [id number]  HPI The patient is a 72 YO female coming in for hospital follow up (in with continued and persistent nausea, poor appetite and poor oral intake, with AKI and possible UTI given fluids and antibiotics). She does have continued poor appetite. She is not eating well but trying to drink some fluids and do ensure. Not eating much solid. Not having BM and this is concerning her. She told them in the hospital and they did not do anything for her. X-ray from recent visit without high stool burden. She denies fevers or chills. No urinary symptoms. Still taking antibiotics doxycyline for her legs. She sees wound clinic in about 1 week. She is not sure why she is not eating well. She denies GERD or allergic rhinitis symptoms. Denies headaches or chest pains or SOB. Denies cough.  PMH, [facility], social history reviewed and updated.   Review of Systems  Constitutional: Positive for activity change, appetite change and fatigue. Negative for chills, fever and unexpected weight change.  HENT: Negative.   Eyes: Negative.   Respiratory: Negative for cough, chest tightness and shortness of breath.   Cardiovascular: Positive for leg swelling. Negative for chest pain and palpitations.  Gastrointestinal: Positive for abdominal distention and nausea. Negative for abdominal pain, constipation, diarrhea and vomiting.  Genitourinary: Negative.   Musculoskeletal: Negative.   Skin: Negative.   Neurological: Negative.   Psychiatric/Behavioral: Negative.       Objective:   Physical Exam  Constitutional: She is oriented to person, place, and time. She appears well-developed and well-nourished.  Appears chronically ill, some temporal wasting  HENT:  Head: Normocephalic and atraumatic.  Temporal wasting  Eyes: EOM are normal.  Neck: Normal range of motion.  Cardiovascular: Normal rate and regular rhythm.  Pulmonary/Chest:  Effort normal and breath sounds normal. No respiratory distress. She has no wheezes. She has no rales.  Abdominal: Soft. Bowel sounds are normal. She exhibits no distension. There is no tenderness. There is no rebound.  Musculoskeletal: She exhibits no edema.  Neurological: She is alert and oriented to person, place, and time. Coordination abnormal.  Walker with seat  Skin: Skin is warm and dry.  Psychiatric: She has a normal mood and affect.   Vitals:   11/17/17 0950  BP: 110/60  Pulse: (!) 120  Temp: 97.9 F (36.6 C)  TempSrc: Oral  SpO2: 98%  Weight: 287 lb (130.2 kg)  Height: 5\' 11"  (1.803 m)      Assessment & Plan:

## 2017-11-18 ENCOUNTER — Ambulatory Visit: Payer: Self-pay

## 2017-11-18 NOTE — Assessment & Plan Note (Signed)
Incidental CT chest in the hospital without findings and CT abdomen without concerning findings as well. She has had CT head without changes as well. She has had extensive lab workup for poor appetite without cause. She is using zofran for nausea. Encouraged her to push oral intake to help with energy.

## 2017-11-18 NOTE — Assessment & Plan Note (Signed)
Checking CMP today and adjust as needed. She is still not eating or drinking well but trying to do water and ensure and forcing herself although she does not want to eat.

## 2017-11-18 NOTE — Assessment & Plan Note (Signed)
Will finish doxycycline and follow up with wound clinic. Chronic lymphedema which is stable on exam today.

## 2017-11-18 NOTE — Assessment & Plan Note (Signed)
Some dysphagia on swallow exam in the hospital and we reminded her about this today. She does not want to pursue this by seeing GI currently. She wants to wait and see if this passes.

## 2017-11-19 ENCOUNTER — Ambulatory Visit: Payer: Self-pay

## 2017-11-23 ENCOUNTER — Ambulatory Visit: Payer: Self-pay

## 2017-11-23 ENCOUNTER — Encounter (HOSPITAL_BASED_OUTPATIENT_CLINIC_OR_DEPARTMENT_OTHER): Payer: PPO | Attending: Internal Medicine

## 2017-11-23 ENCOUNTER — Other Ambulatory Visit: Payer: Self-pay

## 2017-11-23 DIAGNOSIS — I509 Heart failure, unspecified: Secondary | ICD-10-CM | POA: Insufficient documentation

## 2017-11-23 DIAGNOSIS — I87333 Chronic venous hypertension (idiopathic) with ulcer and inflammation of bilateral lower extremity: Secondary | ICD-10-CM | POA: Insufficient documentation

## 2017-11-23 DIAGNOSIS — Z87891 Personal history of nicotine dependence: Secondary | ICD-10-CM | POA: Diagnosis not present

## 2017-11-23 DIAGNOSIS — I890 Lymphedema, not elsewhere classified: Secondary | ICD-10-CM | POA: Diagnosis not present

## 2017-11-23 DIAGNOSIS — E1151 Type 2 diabetes mellitus with diabetic peripheral angiopathy without gangrene: Secondary | ICD-10-CM | POA: Diagnosis not present

## 2017-11-23 DIAGNOSIS — I110 Hypertensive heart disease with heart failure: Secondary | ICD-10-CM | POA: Diagnosis not present

## 2017-11-23 DIAGNOSIS — L97812 Non-pressure chronic ulcer of other part of right lower leg with fat layer exposed: Secondary | ICD-10-CM | POA: Diagnosis not present

## 2017-11-23 DIAGNOSIS — L97212 Non-pressure chronic ulcer of right calf with fat layer exposed: Secondary | ICD-10-CM | POA: Diagnosis not present

## 2017-11-23 DIAGNOSIS — E11622 Type 2 diabetes mellitus with other skin ulcer: Secondary | ICD-10-CM | POA: Diagnosis not present

## 2017-11-23 DIAGNOSIS — L97822 Non-pressure chronic ulcer of other part of left lower leg with fat layer exposed: Secondary | ICD-10-CM | POA: Diagnosis not present

## 2017-11-23 DIAGNOSIS — I872 Venous insufficiency (chronic) (peripheral): Secondary | ICD-10-CM | POA: Diagnosis not present

## 2017-11-23 DIAGNOSIS — L97222 Non-pressure chronic ulcer of left calf with fat layer exposed: Secondary | ICD-10-CM | POA: Diagnosis not present

## 2017-11-23 NOTE — Patient Outreach (Signed)
[facility]) Care Management  11/23/2017  Akeira Dible 04-17-1945 [id number]  72 year old female outreached by [facility] services for a 30 day post discharge medication review.  PMHx includes, but not limited to, hypertension, SVT, GERD, Type 2 diabetes mellitus, arthritis, CKD Stage III and lymphedema.   Unsuccessful outreach attempt to Ms. Gu.  Left HIPAA compliant voice message requesting a return call.  Plan: Outreach attempt on Friday 8/23.   Joetta Manners, PharmD Clinical Pharmacist [place] [phone]  Addendum: Incoming call received from Mr.Zuluaga.  HIPAA identifiers verified.   Subjective: Ms. Esquivias states that [place] did not come out on 8/10 at 0900 as scheduled.  I was present on three way call when the appointment was confirmed.  She reports that she was managing her own wound until today when she got in to wound clinic that has worked with her in the past.  She states that the left leg is "real bad."  She reports that she is finishing the 10 days of doxycycline that was prescribed at discharge and was told to wear her "leg pumps" to help stimulate the removal of fluid.  Ms. Kreiser states that she has another appointment next Tuesday at the wound clinic.  Patient reports that she still has a rash, although it is improved, but states that her skin is very dry.  She states that her appetite is decreased.   Ms. Topor states that she has no further medication related questions or concerns.  Informed her that I will close her [place] case at this time.  She is aware that she can call me in the future should medication issues arise.   Plan: Route discipline closure letter to PCP, Dr. [name].   Joetta Manners, PharmD Clinical Pharmacist [place] [phone]

## 2017-11-30 ENCOUNTER — Ambulatory Visit (INDEPENDENT_AMBULATORY_CARE_PROVIDER_SITE_OTHER): Payer: PPO | Admitting: Sports Medicine

## 2017-11-30 ENCOUNTER — Encounter: Payer: Self-pay | Admitting: Sports Medicine

## 2017-11-30 DIAGNOSIS — M79676 Pain in unspecified toe(s): Secondary | ICD-10-CM | POA: Diagnosis not present

## 2017-11-30 DIAGNOSIS — E1142 Type 2 diabetes mellitus with diabetic polyneuropathy: Secondary | ICD-10-CM

## 2017-11-30 DIAGNOSIS — E1151 Type 2 diabetes mellitus with diabetic peripheral angiopathy without gangrene: Secondary | ICD-10-CM | POA: Diagnosis not present

## 2017-11-30 DIAGNOSIS — B351 Tinea unguium: Secondary | ICD-10-CM

## 2017-11-30 NOTE — Progress Notes (Signed)
Subjective: [name] [name] is a 72 y.o. female patient with history of diabetes who presents to office today complaining of long, painful nails  while ambulating in shoes; unable to trim. Patient states that the glucose reading this morning was not recorded, last A1c 7.1. Patient denies any new changes in medication or new problems. Patient denies any other constitutitonal symptoms.    Patient Active Problem List   Diagnosis Date Noted  . Obesity, Class II, BMI 35-39.9, isolated (see actual BMI) 11/08/2017  . CKD (chronic kidney disease), stage III ([place]) 11/08/2017  . Microcytic anemia 11/07/2017  . Thrombocytosis ([place]) 11/07/2017  . Lymphedema 11/07/2017  . AKI (acute kidney injury) ([place]) 10/16/2017  . Routine general medical examination at a health care facility 05/10/2014  . Paroxysmal SVT (supraventricular tachycardia) ([place]) 11/28/2013  . Physical deconditioning 11/23/2013  . Generalized weakness 11/17/2013  . Nausea with vomiting 11/17/2013  . Multilevel spine pain 11/17/2013  . Aortic stenosis, mild 11/17/2013  . Cellulitis of left lower extremity 11/17/2013  . DM type 2 (diabetes mellitus, type 2) ([place]) 02/02/2013  . Arthritis 10/19/2012  . Mixed incontinence 10/19/2012  . GERD (gastroesophageal reflux disease) 04/20/2011  . HTN (hypertension) 01/20/2011  . Morbid obesity ([place])    Current Outpatient Medications on File Prior to Visit  Medication Sig Dispense Refill  . acetaminophen (TYLENOL) 325 MG tablet Take 2 tablets (650 mg total) by mouth every 6 (six) hours as needed for mild pain (or Fever >/= 101).    Marland Kitchen aspirin EC 81 MG tablet Take 81 mg by mouth daily with breakfast. Takes once or twice weekly    . ferrous sulfate 325 (65 FE) MG tablet Take 1 tablet (325 mg total) by mouth daily. 30 tablet 3  . folic acid (FOLVITE) 1 MG tablet Take 1 tablet (1 mg total) by mouth daily. 30 tablet 3  . losartan (COZAAR) 100 MG tablet Take 1 tablet (100 mg total) by mouth daily. 90  tablet 3  . metFORMIN (GLUCOPHAGE) 500 MG tablet TAKE 1 TABLET BY MOUTH DAILY WITH BREAKFAST. 90 tablet 3  . methocarbamol (ROBAXIN) 500 MG tablet Take 1 tablet (500 mg total) by mouth every 6 (six) hours as needed for muscle spasms. 30 tablet 0  . metoprolol tartrate (LOPRESSOR) 50 MG tablet Take 1 tablet (50 mg total) by mouth 2 (two) times daily. 180 tablet 1  . montelukast (SINGULAIR) 10 MG tablet Take 10 mg by mouth daily. Says she is not taking everyday.  3  . Neomycin-Bacitracin-Polymyxin ([facility] ANTIBIOTIC) [phone] OINT APPLY AFTER CLEANING BEFORE WRAPPING LGS (Patient not taking: Reported on 11/12/2017) 453.9 g 0  . ondansetron (ZOFRAN) 4 MG tablet Take 1 tablet (4 mg total) by mouth every 8 (eight) hours as needed for nausea or vomiting. 14 tablet 0  . polyethylene glycol (MIRALAX / GLYCOLAX) packet Take 17 g by mouth daily. (Patient not taking: Reported on 11/12/2017) 14 each 0   No current facility-administered medications on file prior to visit.    Allergies  Allergen Reactions  . Oxycodone Hcl Nausea And Vomiting  . Penicillins Itching    Has patient had a PCN reaction causing immediate rash, facial/tongue/throat swelling, SOB or lightheadedness with hypotension: No Has patient had a PCN reaction causing severe rash involving mucus membranes or skin necrosis: No Has patient had a PCN reaction that required hospitalization: No Has patient had a PCN reaction occurring within the last 10 years: No If all of the above answers are "NO", then may proceed  with Cephalosporin use.    Recent Results (from the past 2160 hour(s))  Vitamin B12     Status: Abnormal   Collection Time: 10/11/17  8:27 AM  Result Value Ref Range   Vitamin B-12 >1500 (H) 211 - 911 pg/mL  VITAMIN D 25 Hydroxy (Vit-D Deficiency, Fractures)     Status: Abnormal   Collection Time: 10/11/17  8:27 AM  Result Value Ref Range   VITD 12.21 (L) 30.00 - 100.00 ng/mL  Lipase     Status: None   Collection Time: 10/11/17   8:27 AM  Result Value Ref Range   Lipase 22.0 11.0 - 59.0 U/L  CBC     Status: Abnormal   Collection Time: 10/11/17  8:27 AM  Result Value Ref Range   WBC 8.2 4.0 - 10.5 K/uL   RBC 4.12 3.87 - 5.11 Mil/uL   Platelets 563.0 (H) 150.0 - 400.0 K/uL   Hemoglobin 9.9 (L) 12.0 - 15.0 g/dL   HCT 29.8 (L) 36.0 - 46.0 %   MCV 72.4 (L) 78.0 - 100.0 fl   MCHC 33.2 30.0 - 36.0 g/dL   RDW 18.2 (H) 11.5 - 15.5 %  Comprehensive metabolic panel     Status: Abnormal   Collection Time: 10/11/17  8:27 AM  Result Value Ref Range   Sodium 133 (L) 135 - 145 mEq/L   Potassium 4.4 3.5 - 5.1 mEq/L   Chloride 103 96 - 112 mEq/L   CO2 19 19 - 32 mEq/L   Glucose, Bld 149 (H) 70 - 99 mg/dL   BUN 55 (H) 6 - 23 mg/dL   Creatinine, Ser 1.48 (H) 0.40 - 1.20 mg/dL   Total Bilirubin 0.4 0.2 - 1.2 mg/dL   Alkaline Phosphatase 59 39 - 117 U/L   AST 18 0 - 37 U/L   ALT 14 0 - 35 U/L   Total Protein 8.8 (H) 6.0 - 8.3 g/dL   Albumin 3.5 3.5 - 5.2 g/dL   Calcium 9.5 8.4 - 10.5 mg/dL   GFR 44.71 (L) >60.00 mL/min  Hemoglobin A1c     Status: Abnormal   Collection Time: 10/11/17  8:27 AM  Result Value Ref Range   Hgb A1c MFr Bld 7.1 (H) 4.6 - 6.5 %    Comment: Glycemic Control Guidelines for People with Diabetes:Non Diabetic:  <6%Goal of Therapy: <7%Additional Action Suggested:  >8%   Lipase     Status: None   Collection Time: 10/15/17  9:49 AM  Result Value Ref Range   Lipase 18.0 11.0 - 59.0 U/L  CBC     Status: Abnormal   Collection Time: 10/15/17  9:49 AM  Result Value Ref Range   WBC 10.7 (H) 4.0 - 10.5 K/uL   RBC 4.31 3.87 - 5.11 Mil/uL   Platelets 555.0 (H) 150.0 - 400.0 K/uL   Hemoglobin 10.2 (L) 12.0 - 15.0 g/dL   HCT 31.5 (L) 36.0 - 46.0 %   MCV 73.1 (L) 78.0 - 100.0 fl   MCHC 32.5 30.0 - 36.0 g/dL   RDW 18.3 (H) 11.5 - 15.5 %  Comprehensive metabolic panel     Status: Abnormal   Collection Time: 10/15/17  9:49 AM  Result Value Ref Range   Sodium 133 (L) 135 - 145 mEq/L   Potassium 4.9 3.5 -  5.1 mEq/L   Chloride 103 96 - 112 mEq/L   CO2 18 (L) 19 - 32 mEq/L   Glucose, Bld 173 (H) 70 - 99 mg/dL   BUN  61 (H) 6 - 23 mg/dL   Creatinine, Ser 2.20 (H) 0.40 - 1.20 mg/dL   Total Bilirubin 0.4 0.2 - 1.2 mg/dL   Alkaline Phosphatase 74 39 - 117 U/L   AST 14 0 - 37 U/L   ALT 12 0 - 35 U/L   Total Protein 9.0 (H) 6.0 - 8.3 g/dL   Albumin 3.4 (L) 3.5 - 5.2 g/dL   Calcium 9.5 8.4 - 10.5 mg/dL   GFR 28.30 (L) >60.00 mL/min  Comprehensive metabolic panel     Status: Abnormal   Collection Time: 10/16/17 11:05 AM  Result Value Ref Range   Sodium 134 (L) 135 - 145 mmol/L   Potassium 4.7 3.5 - 5.1 mmol/L   Chloride 104 98 - 111 mmol/L    Comment: Please note change in reference range.   CO2 18 (L) 22 - 32 mmol/L   Glucose, Bld 194 (H) 70 - 99 mg/dL    Comment: Please note change in reference range.   BUN 58 (H) 8 - 23 mg/dL    Comment: Please note change in reference range.   Creatinine, Ser 2.01 (H) 0.44 - 1.00 mg/dL   Calcium 9.2 8.9 - 10.3 mg/dL   Total Protein 8.6 (H) 6.5 - 8.1 g/dL   Albumin 2.9 (L) 3.5 - 5.0 g/dL   AST 22 15 - 41 U/L   ALT 16 0 - 44 U/L    Comment: Please note change in reference range.   Alkaline Phosphatase 72 38 - 126 U/L   Total Bilirubin 0.5 0.3 - 1.2 mg/dL   GFR calc non Af Amer 24 (L) >60 mL/min   GFR calc Af Amer 28 (L) >60 mL/min    Comment: (NOTE) The eGFR has been calculated using the CKD EPI equation. This calculation has not been validated in all clinical situations. eGFR's persistently <60 mL/min signify possible Chronic Kidney Disease.    Anion gap 12 5 - 15    Comment: Performed at [facility] [address], [place], [place] [address]  CBC with Differential     Status: Abnormal   Collection Time: 10/16/17 11:05 AM  Result Value Ref Range   WBC 7.4 4.0 - 10.5 K/uL   RBC 4.29 3.87 - 5.11 MIL/uL   Hemoglobin 9.9 (L) 12.0 - 15.0 g/dL   HCT 32.6 (L) 36.0 - 46.0 %   MCV 76.0 (L) 78.0 - 100.0 fL   MCH 23.1 (L) 26.0 - 34.0 pg    MCHC 30.4 30.0 - 36.0 g/dL   RDW 18.2 (H) 11.5 - 15.5 %   Platelets 469 (H) 150 - 400 K/uL   Neutrophils Relative % 77 %   Neutro Abs 5.7 1.7 - 7.7 K/uL   Lymphocytes Relative 12 %   Lymphs Abs 0.9 0.7 - 4.0 K/uL   Monocytes Relative 7 %   Monocytes Absolute 0.6 0.1 - 1.0 K/uL   Eosinophils Relative 2 %   Eosinophils Absolute 0.2 0.0 - 0.7 K/uL   Basophils Relative 1 %   Basophils Absolute 0.1 0.0 - 0.1 K/uL   Immature Granulocytes 1 %   Abs Immature Granulocytes 0.1 0.0 - 0.1 K/uL    Comment: Performed at [facility], [address], [place], [place] [address]  I-Stat CG4 Lactic Acid, ED     Status: Abnormal   Collection Time: 10/16/17 11:18 AM  Result Value Ref Range   Lactic Acid, Venous 1.97 (H) 0.5 - 1.9 mmol/L  Magnesium  Status: None   Collection Time: 10/16/17  1:30 PM  Result Value Ref Range   Magnesium 2.4 1.7 - 2.4 mg/dL    Comment: Performed at [facility], [place] [address] [place], [place] [address]  I-Stat CG4 Lactic Acid, ED     Status: None   Collection Time: 10/16/17  1:36 PM  Result Value Ref Range   Lactic Acid, Venous 1.45 0.5 - 1.9 mmol/L  Urinalysis, Routine w reflex microscopic     Status: Abnormal   Collection Time: 10/16/17  3:52 PM  Result Value Ref Range   Color, Urine YELLOW YELLOW   APPearance HAZY (A) CLEAR   Specific Gravity, Urine 1.013 1.005 - 1.030   pH 5.0 5.0 - 8.0   Glucose, UA NEGATIVE NEGATIVE mg/dL   Hgb urine dipstick SMALL (A) NEGATIVE   Bilirubin Urine NEGATIVE NEGATIVE   Ketones, ur NEGATIVE NEGATIVE mg/dL   Protein, ur NEGATIVE NEGATIVE mg/dL   Nitrite NEGATIVE NEGATIVE   Leukocytes, UA TRACE (A) NEGATIVE   RBC / HPF 0-5 0 - 5 RBC/hpf   WBC, UA 0-5 0 - 5 WBC/hpf   Bacteria, UA MANY (A) NONE SEEN   Squamous Epithelial / LPF 0-5 0 - 5    Comment: Performed at [facility], [address], [place], [place] [address]  Glucose, capillary     Status: None   Collection Time: 10/16/17  6:09 PM  Result Value  Ref Range   Glucose-Capillary 91 70 - 99 mg/dL  Urine culture     Status: Abnormal   Collection Time: 10/16/17  9:48 PM  Result Value Ref Range   Specimen Description URINE, RANDOM    Special Requests      NONE Performed at [facility], [place] [address], [place], [place] [address]    Culture >=100,000 COLONIES/mL KLEBSIELLA PNEUMONIAE (A)    Report Status 10/19/2017 FINAL    Organism ID, Bacteria KLEBSIELLA PNEUMONIAE (A)       Susceptibility   Klebsiella pneumoniae - MIC*    AMPICILLIN RESISTANT Resistant     CEFAZOLIN <=4 SENSITIVE Sensitive     CEFTRIAXONE <=1 SENSITIVE Sensitive     CIPROFLOXACIN <=0.25 SENSITIVE Sensitive     GENTAMICIN <=1 SENSITIVE Sensitive     IMIPENEM <=0.25 SENSITIVE Sensitive     NITROFURANTOIN 64 INTERMEDIATE Intermediate     TRIMETH/SULFA <=20 SENSITIVE Sensitive     AMPICILLIN/SULBACTAM <=2 SENSITIVE Sensitive     PIP/TAZO <=4 SENSITIVE Sensitive     Extended ESBL NEGATIVE Sensitive     * >=100,000 COLONIES/mL KLEBSIELLA PNEUMONIAE  CBC     Status: Abnormal   Collection Time: 10/17/17  4:02 AM  Result Value Ref Range   WBC 5.9 4.0 - 10.5 K/uL   RBC 3.27 (L) 3.87 - 5.11 MIL/uL   Hemoglobin 7.6 (L) 12.0 - 15.0 g/dL    Comment: REPEATED TO VERIFY SPECIMEN CHECKED FOR CLOTS DELTA CHECK NOTED    HCT 24.6 (L) 36.0 - 46.0 %   MCV 75.2 (L) 78.0 - 100.0 fL   MCH 23.2 (L) 26.0 - 34.0 pg   MCHC 30.9 30.0 - 36.0 g/dL   RDW 17.5 (H) 11.5 - 15.5 %   Platelets 338 150 - 400 K/uL    Comment: Performed at [facility], [place] [address] [place], [place] [address]  Basic metabolic panel     Status: Abnormal   Collection Time: 10/17/17  4:02 AM  Result Value Ref Range  Sodium 135 135 - 145 mmol/L   Potassium 4.7 3.5 - 5.1 mmol/L   Chloride 110 98 - 111 mmol/L    Comment: Please note change in reference range.   CO2 20 (L) 22 - 32 mmol/L   Glucose, Bld 90 70 - 99 mg/dL    Comment: Please note change in reference range.   BUN 38 (H) 8 - 23  mg/dL    Comment: Please note change in reference range.   Creatinine, Ser 1.33 (H) 0.44 - 1.00 mg/dL   Calcium 8.2 (L) 8.9 - 10.3 mg/dL   GFR calc non Af Amer 39 (L) >60 mL/min   GFR calc Af Amer 45 (L) >60 mL/min    Comment: (NOTE) The eGFR has been calculated using the CKD EPI equation. This calculation has not been validated in all clinical situations. eGFR's persistently <60 mL/min signify possible Chronic Kidney Disease.    Anion gap 5 5 - 15    Comment: Performed at [facility] [address], [place], [place] [address]  Glucose, capillary     Status: None   Collection Time: 10/17/17  7:33 AM  Result Value Ref Range   Glucose-Capillary 89 70 - 99 mg/dL  Glucose, capillary     Status: None   Collection Time: 10/17/17 11:41 AM  Result Value Ref Range   Glucose-Capillary 86 70 - 99 mg/dL  Glucose, capillary     Status: None   Collection Time: 10/17/17  4:53 PM  Result Value Ref Range   Glucose-Capillary 72 70 - 99 mg/dL  Glucose, capillary     Status: Abnormal   Collection Time: 10/17/17 11:59 PM  Result Value Ref Range   Glucose-Capillary 115 (H) 70 - 99 mg/dL  Basic metabolic panel     Status: Abnormal   Collection Time: 10/18/17  4:02 AM  Result Value Ref Range   Sodium 137 135 - 145 mmol/L   Potassium 4.6 3.5 - 5.1 mmol/L   Chloride 110 98 - 111 mmol/L    Comment: Please note change in reference range.   CO2 21 (L) 22 - 32 mmol/L   Glucose, Bld 83 70 - 99 mg/dL    Comment: Please note change in reference range.   BUN 21 8 - 23 mg/dL    Comment: Please note change in reference range.   Creatinine, Ser 1.06 (H) 0.44 - 1.00 mg/dL   Calcium 8.3 (L) 8.9 - 10.3 mg/dL   GFR calc non Af Amer 52 (L) >60 mL/min   GFR calc Af Amer 60 (L) >60 mL/min    Comment: (NOTE) The eGFR has been calculated using the CKD EPI equation. This calculation has not been validated in all clinical situations. eGFR's persistently <60 mL/min signify possible Chronic Kidney Disease.     Anion gap 6 5 - 15    Comment: Performed at [facility] [address], [place], [place] [address]  CBC     Status: Abnormal   Collection Time: 10/18/17  4:02 AM  Result Value Ref Range   WBC 5.1 4.0 - 10.5 K/uL   RBC 3.50 (L) 3.87 - 5.11 MIL/uL   Hemoglobin 8.0 (L) 12.0 - 15.0 g/dL   HCT 26.9 (L) 36.0 - 46.0 %   MCV 76.9 (L) 78.0 - 100.0 fL   MCH 22.9 (L) 26.0 - 34.0 pg   MCHC 29.7 (L) 30.0 - 36.0 g/dL   RDW 18.0 (H) 11.5 - 15.5 %   Platelets 362 150 -  400 K/uL    Comment: Performed at [facility], [place] [address], [place], [place] [address]  Glucose, capillary     Status: None   Collection Time: 10/18/17  8:11 AM  Result Value Ref Range   Glucose-Capillary 76 70 - 99 mg/dL  Glucose, capillary     Status: None   Collection Time: 10/18/17 11:48 AM  Result Value Ref Range   Glucose-Capillary 87 70 - 99 mg/dL  POCT Urinalysis Dipstick     Status: Abnormal   Collection Time: 10/22/17  9:50 AM  Result Value Ref Range   Color, UA dark yellow    Clarity, UA Cloudy    Glucose, UA Negative Negative   Bilirubin, UA Neg    Ketones, UA Neg    Spec Grav, UA >=1.030 (A) 1.010 - 1.025   Blood, UA Neg    pH, UA 5.5 5.0 - 8.0   Protein, UA Negative Negative   Urobilinogen, UA 0.2 0.2 or 1.0 E.U./dL   Nitrite, UA Neg    Leukocytes, UA Negative Negative   Appearance     Odor    Urine Culture     Status: None   Collection Time: 10/22/17 10:09 AM  Result Value Ref Range   MICRO NUMBER: [id number]    SPECIMEN QUALITY: ADEQUATE    Sample Source URINE    STATUS: FINAL    Result:      Multiple organisms present, each less than 10,000 CFU/mL. These organisms, commonly found on external and internal genitalia, are considered to be colonizers. No further testing performed.  Comprehensive metabolic panel     Status: Abnormal   Collection Time: 11/06/17 11:39 AM  Result Value Ref Range   Sodium 133 (L) 135 - 145 mmol/L   Potassium 5.5 (H) 3.5 - 5.1 mmol/L   Chloride 103 98 -  111 mmol/L   CO2 17 (L) 22 - 32 mmol/L   Glucose, Bld 122 (H) 70 - 99 mg/dL   BUN 58 (H) 8 - 23 mg/dL   Creatinine, Ser 2.60 (H) 0.44 - 1.00 mg/dL   Calcium 9.1 8.9 - 10.3 mg/dL   Total Protein 8.7 (H) 6.5 - 8.1 g/dL   Albumin 3.1 (L) 3.5 - 5.0 g/dL   AST 23 15 - 41 U/L   ALT 12 0 - 44 U/L   Alkaline Phosphatase 73 38 - 126 U/L   Total Bilirubin 0.5 0.3 - 1.2 mg/dL   GFR calc non Af Amer 17 (L) >60 mL/min   GFR calc Af Amer 20 (L) >60 mL/min    Comment: (NOTE) The eGFR has been calculated using the CKD EPI equation. This calculation has not been validated in all clinical situations. eGFR's persistently <60 mL/min signify possible Chronic Kidney Disease.    Anion gap 13 5 - 15    Comment: Performed at [facility], [place] [address], [place], [place] [address]  Lipase, blood     Status: None   Collection Time: 11/06/17 11:39 AM  Result Value Ref Range   Lipase 28 11 - 51 U/L    Comment: Performed at [facility], [place] [address], [place], [place] [address]  CBC with Differential     Status: Abnormal   Collection Time: 11/06/17 11:39 AM  Result Value Ref Range   WBC 8.5 4.0 - 10.5 K/uL   RBC 4.12 3.87 - 5.11 MIL/uL   Hemoglobin 9.8 (L) 12.0 - 15.0 g/dL   HCT 31.2 (L) 36.0 -  46.0 %   MCV 75.7 (L) 78.0 - 100.0 fL   MCH 23.8 (L) 26.0 - 34.0 pg   MCHC 31.4 30.0 - 36.0 g/dL   RDW 18.6 (H) 11.5 - 15.5 %   Platelets 528 (H) 150 - 400 K/uL   Neutrophils Relative % 79 %   Neutro Abs 6.7 1.7 - 7.7 K/uL   Lymphocytes Relative 14 %   Lymphs Abs 1.2 0.7 - 4.0 K/uL   Monocytes Relative 6 %   Monocytes Absolute 0.5 0.1 - 1.0 K/uL   Eosinophils Relative 0 %   Eosinophils Absolute 0.0 0.0 - 0.7 K/uL   Basophils Relative 1 %   Basophils Absolute 0.1 0.0 - 0.1 K/uL    Comment: Performed at [facility], [place] [address], [place], [place] [address]  Urinalysis, Routine w reflex microscopic     Status: Abnormal   Collection Time: 11/06/17   8:30 PM  Result Value Ref Range   Color, Urine YELLOW YELLOW   APPearance CLOUDY (A) CLEAR   Specific Gravity, Urine 1.019 1.005 - 1.030   pH 5.0 5.0 - 8.0   Glucose, UA NEGATIVE NEGATIVE mg/dL   Hgb urine dipstick NEGATIVE NEGATIVE   Bilirubin Urine NEGATIVE NEGATIVE   Ketones, ur NEGATIVE NEGATIVE mg/dL   Protein, ur NEGATIVE NEGATIVE mg/dL   Nitrite NEGATIVE NEGATIVE   Leukocytes, UA NEGATIVE NEGATIVE    Comment: Performed at [facility] [address], [place], [place] [address]  Comprehensive metabolic panel     Status: Abnormal   Collection Time: 11/07/17  5:14 AM  Result Value Ref Range   Sodium 135 135 - 145 mmol/L   Potassium 5.2 (H) 3.5 - 5.1 mmol/L   Chloride 108 98 - 111 mmol/L   CO2 21 (L) 22 - 32 mmol/L   Glucose, Bld 101 (H) 70 - 99 mg/dL   BUN 48 (H) 8 - 23 mg/dL   Creatinine, Ser 1.82 (H) 0.44 - 1.00 mg/dL   Calcium 8.0 (L) 8.9 - 10.3 mg/dL   Total Protein 5.9 (L) 6.5 - 8.1 g/dL   Albumin 2.2 (L) 3.5 - 5.0 g/dL   AST 16 15 - 41 U/L   ALT 9 0 - 44 U/L   Alkaline Phosphatase 57 38 - 126 U/L   Total Bilirubin 0.3 0.3 - 1.2 mg/dL   GFR calc non Af Amer 27 (L) >60 mL/min   GFR calc Af Amer 31 (L) >60 mL/min    Comment: (NOTE) The eGFR has been calculated using the CKD EPI equation. This calculation has not been validated in all clinical situations. eGFR's persistently <60 mL/min signify possible Chronic Kidney Disease.    Anion gap 6 5 - 15    Comment: Performed at [facility], [place] [address] [place], [place] [address]  CBC     Status: Abnormal   Collection Time: 11/07/17  5:14 AM  Result Value Ref Range   WBC 5.4 4.0 - 10.5 K/uL   RBC 2.96 (L) 3.87 - 5.11 MIL/uL   Hemoglobin 7.2 (L) 12.0 - 15.0 g/dL    Comment: REPEATED TO VERIFY DELTA CHECK NOTED    HCT 22.3 (L) 36.0 - 46.0 %   MCV 75.3 (L) 78.0 - 100.0 fL   MCH 24.3 (L) 26.0 - 34.0 pg   MCHC 32.3 30.0 - 36.0 g/dL   RDW 18.7 (H) 11.5 - 15.5 %   Platelets  432 (H) 150 - 400 K/uL    Comment:  Performed at [facility], [place] [address] [place], [place] [address]  Magnesium     Status: None   Collection Time: 11/07/17  5:14 AM  Result Value Ref Range   Magnesium 2.0 1.7 - 2.4 mg/dL    Comment: Performed at [facility], [place] [address] [place], [place] [address]  Basic metabolic panel     Status: Abnormal   Collection Time: 11/08/17  8:57 AM  Result Value Ref Range   Sodium 140 135 - 145 mmol/L   Potassium 4.1 3.5 - 5.1 mmol/L    Comment: DELTA CHECK NOTED REPEATED TO VERIFY    Chloride 111 98 - 111 mmol/L   CO2 24 22 - 32 mmol/L   Glucose, Bld 106 (H) 70 - 99 mg/dL   BUN 26 (H) 8 - 23 mg/dL   Creatinine, Ser 1.09 (H) 0.44 - 1.00 mg/dL   Calcium 8.2 (L) 8.9 - 10.3 mg/dL   GFR calc non Af Amer 50 (L) >60 mL/min   GFR calc Af Amer 58 (L) >60 mL/min    Comment: (NOTE) The eGFR has been calculated using the CKD EPI equation. This calculation has not been validated in all clinical situations. eGFR's persistently <60 mL/min signify possible Chronic Kidney Disease.    Anion gap 5 5 - 15    Comment: Performed at [facility], [place] [address], [place], [place] [address]  CBC with Differential/Platelet     Status: Abnormal   Collection Time: 11/08/17  8:57 AM  Result Value Ref Range   WBC 5.6 4.0 - 10.5 K/uL   RBC 3.41 (L) 3.87 - 5.11 MIL/uL   Hemoglobin 8.3 (L) 12.0 - 15.0 g/dL   HCT 26.2 (L) 36.0 - 46.0 %   MCV 76.8 (L) 78.0 - 100.0 fL   MCH 24.3 (L) 26.0 - 34.0 pg   MCHC 31.7 30.0 - 36.0 g/dL   RDW 19.2 (H) 11.5 - 15.5 %   Platelets 371 150 - 400 K/uL   Neutrophils Relative % 56 %   Neutro Abs 3.2 1.7 - 7.7 K/uL   Lymphocytes Relative 35 %   Lymphs Abs 2.0 0.7 - 4.0 K/uL   Monocytes Relative 6 %   Monocytes Absolute 0.3 0.1 - 1.0 K/uL   Eosinophils Relative 2 %   Eosinophils Absolute 0.1 0.0 - 0.7 K/uL   Basophils Relative 1 %   Basophils Absolute 0.1 0.0 - 0.1 K/uL     Comment: Performed at [facility], [place] [address], [place], [place] [address]  Vitamin B12     Status: Abnormal   Collection Time: 11/08/17  8:57 AM  Result Value Ref Range   Vitamin B-12 1,323 (H) 180 - 914 pg/mL    Comment: (NOTE) This assay is not validated for testing neonatal or myeloproliferative syndrome specimens for Vitamin B12 levels. Performed at [facility], [place] [address] [place], [place] [address]   Iron and TIBC     Status: Abnormal   Collection Time: 11/08/17  8:57 AM  Result Value Ref Range   Iron 29 28 - 170 ug/dL   TIBC 177 (L) 250 - 450 ug/dL   Saturation Ratios 16 10.4 - 31.8 %   UIBC 148 ug/dL    Comment: Performed at [facility], [place] [address], [place], [place] [address]  Ferritin     Status: None   Collection Time: 11/08/17  8:57 AM  Result Value Ref Range  Ferritin 58 11 - 307 ng/mL    Comment: Performed at [facility], [place] [address] [place], [place] [address]  Reticulocytes     Status: Abnormal   Collection Time: 11/08/17  8:57 AM  Result Value Ref Range   Retic Ct Pct 1.4 0.4 - 3.1 %   RBC. 3.39 (L) 3.87 - 5.11 MIL/uL   Retic Count, Absolute 47.5 19.0 - 186.0 K/uL    Comment: Performed at [facility], [place] [address], [place], [place] [address]  Folate     Status: Abnormal   Collection Time: 11/08/17  8:57 AM  Result Value Ref Range   Folate 5.9 (L) >5.9 ng/mL    Comment: Performed at [facility], [place] [address], [place], [place] [address]  Basic metabolic panel     Status: Abnormal   Collection Time: 11/09/17  5:04 AM  Result Value Ref Range   Sodium 139 135 - 145 mmol/L   Potassium 4.9 3.5 - 5.1 mmol/L   Chloride 110 98 - 111 mmol/L   CO2 23 22 - 32 mmol/L   Glucose, Bld 93 70 - 99 mg/dL   BUN 26 (H) 8 - 23 mg/dL   Creatinine, Ser 0.99 0.44 - 1.00 mg/dL   Calcium 8.1 (L) 8.9 - 10.3 mg/dL   GFR calc non Af Amer 56 (L)  >60 mL/min   GFR calc Af Amer >60 >60 mL/min    Comment: (NOTE) The eGFR has been calculated using the CKD EPI equation. This calculation has not been validated in all clinical situations. eGFR's persistently <60 mL/min signify possible Chronic Kidney Disease.    Anion gap 6 5 - 15    Comment: Performed at [facility], [place] [address], [place], [place] [address]  Comprehensive metabolic panel     Status: Abnormal   Collection Time: 11/17/17 10:28 AM  Result Value Ref Range   Sodium 139 135 - 145 mEq/L   Potassium 4.4 3.5 - 5.1 mEq/L   Chloride 107 96 - 112 mEq/L   CO2 22 19 - 32 mEq/L   Glucose, Bld 114 (H) 70 - 99 mg/dL   BUN 19 6 - 23 mg/dL   Creatinine, Ser 1.14 0.40 - 1.20 mg/dL   Total Bilirubin 0.5 0.2 - 1.2 mg/dL   Alkaline Phosphatase 66 39 - 117 U/L   AST 16 0 - 37 U/L   ALT 9 0 - 35 U/L   Total Protein 8.1 6.0 - 8.3 g/dL   Albumin 3.3 (L) 3.5 - 5.2 g/dL   Calcium 9.6 8.4 - 10.5 mg/dL   GFR 60.41 >60.00 mL/min  CBC     Status: Abnormal   Collection Time: 11/17/17 10:28 AM  Result Value Ref Range   WBC 12.1 (H) 4.0 - 10.5 K/uL   RBC 3.69 (L) 3.87 - 5.11 Mil/uL   Platelets 438.0 (H) 150.0 - 400.0 K/uL   Hemoglobin 8.9 Repeated and verified X2. (L) 12.0 - 15.0 g/dL   HCT 28.1 (L) 36.0 - 46.0 %   MCV 76.2 (L) 78.0 - 100.0 fl   MCHC 31.8 30.0 - 36.0 g/dL   RDW 20.1 (H) 11.5 - 15.5 %    Objective: General: Patient is awake, alert, and oriented x 3 and in no acute distress.  Integument: Skin is warm, dry and supple bilateral. Nails are tender, long, thickened and  dystrophic with subungual debris, consistent with onychomycosis, 1-5 bilateral. No signs of infection. Unna boot on left  and right. Remaining integument unremarkable.  Vasculature:  Dorsalis Pedis pulse 0/4 bilateral. Posterior Tibial pulse  0/4 bilateral.  Capillary fill time <5 sec 1-5 bilateral. Lymphedema bilateral.   Neurology: The patient has absent sensation measured with a  5.07/10 g Semmes Weinstein Monofilament at all pedal sites bilateral . Vibratory sensation absent bilateral with tuning fork. No Babinski sign present bilateral.   Musculoskeletal:Asymptomatic bunion and pes planus pedal deformities noted bilateral. Muscular strength 5/5 in all lower extremity muscular groups bilateral without pain on range of motion . No tenderness with calf compression bilateral.  Assessment and Plan: Problem List Items Addressed This Visit    None    Visit Diagnoses    Mycotic toenails    -  Primary   Pain of toe, unspecified laterality       Diabetic peripheral vascular disease ([place])       Diabetic peripheral neuropathy ([place])          -Examined patient. -Discussed and educated patient on diabetic foot care, especially with  regards to the vascular, neurological and musculoskeletal systems.  -Stressed the importance of good glycemic control and the detriment of not  controlling glucose levels in relation to the foot. -Mechanically debrided all nails 1-5 bilateral using sterile nail nipper and filed with dremel without incident  -Continue with diabetic shoes -Continue with follow up at wound care center for unna boots bilateral -Patient to return  in 3 months for at risk foot care -Patient advised to call the office if any problems or questions arise in the meantime.  Landis Martins, DPM

## 2017-12-01 ENCOUNTER — Ambulatory Visit: Payer: Self-pay | Admitting: Internal Medicine

## 2017-12-01 ENCOUNTER — Telehealth: Payer: Self-pay | Admitting: Internal Medicine

## 2017-12-01 DIAGNOSIS — R1310 Dysphagia, unspecified: Secondary | ICD-10-CM

## 2017-12-01 DIAGNOSIS — R112 Nausea with vomiting, unspecified: Secondary | ICD-10-CM

## 2017-12-01 NOTE — Telephone Encounter (Signed)
Patient is willing to see GI

## 2017-12-01 NOTE — Telephone Encounter (Signed)
Is she willing to see GI for stomach symptoms? She wanted to think about this at our last visit.

## 2017-12-01 NOTE — Telephone Encounter (Signed)
FYI Waiting for form to be faxed

## 2017-12-01 NOTE — Telephone Encounter (Signed)
Pt hasn't had a bowel movement in over a week. Taken over the counter Miralax and Enema with no bowel movement. Pt is dehydrated, but has drank water. 2 or 3 bottle waters a day. Pt hasn't had a appetite for over a week. Only ate 1 or 2 fruit cups in over a week.  Patient is calling to report that she has constipation- she feels fullness at rectum- but stool is not coming out. Patient is using laxative and enema without relief. Discussed fecal impaction and how to remove stool from rectal opening- patient understands as she had to help her mother with that in the past. Discussed her ability to do that now and advised her if she finds she is unable to remove the stool she will need to go to ED. She understands and will call back if needed.   **Patient reports she is still not eating well ( if at all) as reported at her last visit- she states she has no appetite and she feels nauseous. She is also not taking her medications as prescribed because of her inability to eat. She may need follow up for this.  Reason for Disposition . [1] Rectal pain or fullness from fecal impaction (rectum full of stool) AND [2] NOT better after SITZ bath, suppository or enema  Answer Assessment - Initial Assessment Questions 1. STOOL PATTERN OR FREQUENCY: "How often do you pass bowel movements (BMs)?"  (Normal range: tid to q 3 days)  "When was the last BM passed?"       Once or twice a week is patient's normal, last week or week before- patient can't remember last time she went 2. STRAINING: "Do you have to strain to have a BM?"      no 3. RECTAL PAIN: "Does your rectum hurt when the stool comes out?" If so, ask: "Do you have hemorrhoids? How bad is the pain?"  (Scale 1-10; or mild, moderate, severe)     No pain- does have history hemorrhoids 4. STOOL COMPOSITION: "Are the stools hard?"      Medium formed 5. BLOOD ON STOOLS: "Has there been any blood on the toilet tissue or on the surface of the BM?" If so, ask:  "When was the last time?"      no 6. CHRONIC CONSTIPATION: "Is this a new problem for you?"  If no, ask: "How long have you had this problem?" (days, weeks, months)      New since hospitalization- over one week 7. CHANGES IN DIET: "Have there been any recent changes in your diet?"      Patient is not eating- she had fruit cups last week- patient reports she is not eating- no appitite  8. MEDICATIONS: "Have you been taking any new medications?"     Patient is taking diabetic medication- irregular and other prescribed medications are irregular 9. LAXATIVES: "Have you been using any laxatives or enemas?"  If yes, ask "What, how often, and when was the last time?"     Laxative and enemas- last Miralax- 2 days ago, enema- Weekend  10. CAUSE: "What do you think is causing the constipation?"        Patient is not eating, impaction possible- patient feels pressure -but nothing coming out 11. OTHER SYMPTOMS: "Do you have any other symptoms?" (e.g., abdominal pain, fever, vomiting)       No- nausea 12. PREGNANCY: "Is there any chance you are pregnant?" "When was your last menstrual period?"       n/a  Protocols used: CONSTIPATION-A-AH

## 2017-12-01 NOTE — Telephone Encounter (Signed)
Copied from [place] [id number]. Topic: Inquiry >> Dec 01, 2017  9:03 AM Sheran Luz wrote: Reason for CRM: Mateo Flow from [facility] called inquiring if they needed to discontinue care for pt stating that pt has had multiple canceled visits, has declined PT and "does not want" her visit today because she is being seen at the wound care center instead. Pt was last seen on 8/10. Mateo Flow would like the discontinuation paper to be faxed to [phone]. Please advise.

## 2017-12-01 NOTE — Telephone Encounter (Signed)
FYI

## 2017-12-02 DIAGNOSIS — L97212 Non-pressure chronic ulcer of right calf with fat layer exposed: Secondary | ICD-10-CM | POA: Diagnosis not present

## 2017-12-02 DIAGNOSIS — L97222 Non-pressure chronic ulcer of left calf with fat layer exposed: Secondary | ICD-10-CM | POA: Diagnosis not present

## 2017-12-02 DIAGNOSIS — E11622 Type 2 diabetes mellitus with other skin ulcer: Secondary | ICD-10-CM | POA: Diagnosis not present

## 2017-12-02 DIAGNOSIS — I890 Lymphedema, not elsewhere classified: Secondary | ICD-10-CM | POA: Diagnosis not present

## 2017-12-02 DIAGNOSIS — I872 Venous insufficiency (chronic) (peripheral): Secondary | ICD-10-CM | POA: Diagnosis not present

## 2017-12-02 NOTE — Telephone Encounter (Signed)
Patient was informed yesterday that the referral would be placed today and they would get a hold of her to schedule. Patient stated understanding

## 2017-12-02 NOTE — Addendum Note (Signed)
Addended by: [name] A on: 12/02/2017 07:32 AM   Modules accepted: Orders

## 2017-12-02 NOTE — Telephone Encounter (Signed)
Referral done to see if they can help with her eating symptoms.

## 2017-12-10 ENCOUNTER — Encounter (HOSPITAL_BASED_OUTPATIENT_CLINIC_OR_DEPARTMENT_OTHER): Payer: PPO | Attending: Internal Medicine

## 2017-12-10 DIAGNOSIS — L97812 Non-pressure chronic ulcer of other part of right lower leg with fat layer exposed: Secondary | ICD-10-CM | POA: Insufficient documentation

## 2017-12-10 DIAGNOSIS — I509 Heart failure, unspecified: Secondary | ICD-10-CM | POA: Insufficient documentation

## 2017-12-10 DIAGNOSIS — L03116 Cellulitis of left lower limb: Secondary | ICD-10-CM | POA: Diagnosis not present

## 2017-12-10 DIAGNOSIS — I872 Venous insufficiency (chronic) (peripheral): Secondary | ICD-10-CM | POA: Diagnosis not present

## 2017-12-10 DIAGNOSIS — I890 Lymphedema, not elsewhere classified: Secondary | ICD-10-CM | POA: Insufficient documentation

## 2017-12-10 DIAGNOSIS — I87333 Chronic venous hypertension (idiopathic) with ulcer and inflammation of bilateral lower extremity: Secondary | ICD-10-CM | POA: Insufficient documentation

## 2017-12-10 DIAGNOSIS — I2510 Atherosclerotic heart disease of native coronary artery without angina pectoris: Secondary | ICD-10-CM | POA: Diagnosis not present

## 2017-12-10 DIAGNOSIS — L97822 Non-pressure chronic ulcer of other part of left lower leg with fat layer exposed: Secondary | ICD-10-CM | POA: Insufficient documentation

## 2017-12-10 DIAGNOSIS — E11622 Type 2 diabetes mellitus with other skin ulcer: Secondary | ICD-10-CM | POA: Insufficient documentation

## 2017-12-10 DIAGNOSIS — I110 Hypertensive heart disease with heart failure: Secondary | ICD-10-CM | POA: Diagnosis not present

## 2017-12-10 DIAGNOSIS — L97222 Non-pressure chronic ulcer of left calf with fat layer exposed: Secondary | ICD-10-CM | POA: Diagnosis not present

## 2017-12-10 DIAGNOSIS — I350 Nonrheumatic aortic (valve) stenosis: Secondary | ICD-10-CM | POA: Diagnosis not present

## 2017-12-10 DIAGNOSIS — L97212 Non-pressure chronic ulcer of right calf with fat layer exposed: Secondary | ICD-10-CM | POA: Diagnosis not present

## 2017-12-10 DIAGNOSIS — L97819 Non-pressure chronic ulcer of other part of right lower leg with unspecified severity: Secondary | ICD-10-CM | POA: Diagnosis not present

## 2017-12-10 DIAGNOSIS — Z4800 Encounter for change or removal of nonsurgical wound dressing: Secondary | ICD-10-CM | POA: Diagnosis not present

## 2017-12-10 DIAGNOSIS — I5030 Unspecified diastolic (congestive) heart failure: Secondary | ICD-10-CM | POA: Diagnosis not present

## 2017-12-10 DIAGNOSIS — I130 Hypertensive heart and chronic kidney disease with heart failure and stage 1 through stage 4 chronic kidney disease, or unspecified chronic kidney disease: Secondary | ICD-10-CM | POA: Diagnosis not present

## 2017-12-14 ENCOUNTER — Telehealth: Payer: Self-pay | Admitting: Internal Medicine

## 2017-12-14 DIAGNOSIS — I350 Nonrheumatic aortic (valve) stenosis: Secondary | ICD-10-CM | POA: Diagnosis not present

## 2017-12-14 DIAGNOSIS — I890 Lymphedema, not elsewhere classified: Secondary | ICD-10-CM | POA: Diagnosis not present

## 2017-12-14 DIAGNOSIS — I130 Hypertensive heart and chronic kidney disease with heart failure and stage 1 through stage 4 chronic kidney disease, or unspecified chronic kidney disease: Secondary | ICD-10-CM | POA: Diagnosis not present

## 2017-12-14 DIAGNOSIS — I5030 Unspecified diastolic (congestive) heart failure: Secondary | ICD-10-CM | POA: Diagnosis not present

## 2017-12-14 DIAGNOSIS — Z4800 Encounter for change or removal of nonsurgical wound dressing: Secondary | ICD-10-CM | POA: Diagnosis not present

## 2017-12-14 DIAGNOSIS — I2510 Atherosclerotic heart disease of native coronary artery without angina pectoris: Secondary | ICD-10-CM | POA: Diagnosis not present

## 2017-12-14 DIAGNOSIS — L03116 Cellulitis of left lower limb: Secondary | ICD-10-CM | POA: Diagnosis not present

## 2017-12-14 NOTE — Telephone Encounter (Signed)
Please send to  [id number] - [place], [place] [address]  Phone: [phone] Fax: [phone]

## 2017-12-14 NOTE — Telephone Encounter (Signed)
Copied from [place] [id number]. Topic: Quick Communication - See Telephone Encounter >> Dec 14, 2017  5:55 PM Vernona Rieger wrote: CRM for notification. See Telephone encounter for: 12/14/17.  Stanton Kidney, RN with [facility] called and said that she sees the patient for wound care. She said she has been asking the patient about her sugars and she keeps saying she just doesn't eat. Later on, she told her that she does not have a sugar machine to even check her sugars with. Stanton Kidney is willing to help educate her. She would like to know could Dr [name] send in a machine for her? Stanton Kidney is calling to confirm with the patient the pharmacy and call back. The one she has on file is to far for her. Mary's number is [phone]

## 2017-12-15 NOTE — Telephone Encounter (Signed)
Is okay okay to send meter in for patient

## 2017-12-15 NOTE — Telephone Encounter (Signed)
She does not need to check sugars on a regular basis

## 2017-12-16 DIAGNOSIS — L97222 Non-pressure chronic ulcer of left calf with fat layer exposed: Secondary | ICD-10-CM | POA: Diagnosis not present

## 2017-12-16 DIAGNOSIS — I872 Venous insufficiency (chronic) (peripheral): Secondary | ICD-10-CM | POA: Diagnosis not present

## 2017-12-16 DIAGNOSIS — E11622 Type 2 diabetes mellitus with other skin ulcer: Secondary | ICD-10-CM | POA: Diagnosis not present

## 2017-12-16 DIAGNOSIS — L97212 Non-pressure chronic ulcer of right calf with fat layer exposed: Secondary | ICD-10-CM | POA: Diagnosis not present

## 2017-12-16 DIAGNOSIS — I890 Lymphedema, not elsewhere classified: Secondary | ICD-10-CM | POA: Diagnosis not present

## 2017-12-16 DIAGNOSIS — S81802A Unspecified open wound, left lower leg, initial encounter: Secondary | ICD-10-CM | POA: Diagnosis not present

## 2017-12-17 ENCOUNTER — Ambulatory Visit (INDEPENDENT_AMBULATORY_CARE_PROVIDER_SITE_OTHER): Payer: PPO | Admitting: Physician Assistant

## 2017-12-17 ENCOUNTER — Encounter: Payer: Self-pay | Admitting: Physician Assistant

## 2017-12-17 ENCOUNTER — Encounter: Payer: Self-pay | Admitting: Gastroenterology

## 2017-12-17 VITALS — BP 140/72 | HR 72 | Ht 71.0 in | Wt 240.8 lb

## 2017-12-17 DIAGNOSIS — R634 Abnormal weight loss: Secondary | ICD-10-CM

## 2017-12-17 DIAGNOSIS — R110 Nausea: Secondary | ICD-10-CM | POA: Diagnosis not present

## 2017-12-17 DIAGNOSIS — K5900 Constipation, unspecified: Secondary | ICD-10-CM | POA: Diagnosis not present

## 2017-12-17 MED ORDER — ONDANSETRON 8 MG PO TBDP: 8.0000 mg | ORAL_TABLET | Freq: Three times a day (TID) | ORAL | 0 refills | Status: DC | PRN

## 2017-12-17 NOTE — Progress Notes (Signed)
Note reviewed and agree with Ms. Mort Sawyers assessment and plan for EGD with me next week.   Vito Cirigliano, DO, [facility]

## 2017-12-17 NOTE — Progress Notes (Signed)
Chief Complaint: Nausea and vomiting  HPI:    [name] [name] is a 72 year old African-American female, known to Dr. Arelia Longest, with a past medical history of CHF, heart murmur, diastolic dysfunction, SVT and multiple others listed below, who was referred to me by Hoyt Koch, * for a complaint of nausea and vomiting.      07/31/2014 colonoscopy was normal.    10/18/2017 DG esophagus with no fixed stricture and esophageal dysmotility.  A 13 mm tablet sat in the distal esophagus despite lack of visible stricture.    11/06/2017-11/09/2017 admission to the hospital for nausea and vomiting with poor oral intake.  Patient had acute kidney injury at that time likely related to decreased oral intake in the setting of infectious etiology.  11/06/2017 abdominal x-ray showed a small volume of colonic stool with no increased stool burden to suggest constipation and no bowel obstruction.    Today, the patient tells me that since July she has felt nauseous and gags when she puts anything in her mouth even water.  This all started with the patient just feeling weak and "not myself", and then she started with nausea.  She was admitted to the hospital initially in July with acute kidney injury and patient tells me she was discharged "feeling exactly the same".  She then returned to the hospital as above and tells me that they did not help her either.  Discusses that she has lost around 30 pounds over this time, because she cannot eat.  Occasionally supplements with Boost and Ensure shakes but over the past couple weeks cannot even stomach these.  Does try to get in her water.      Tells me she is passing very little stool and in fact had a "bowel impaction" about 2 weeks ago which she relieved at home herself.  Since then she has had very little gas passage but no stool.  She did try 1 dose of MiraLAX which resulted in nothing.  Patient has not really been taking any of her prescription medicines because "I do not want to  take them on an empty stomach".  Tells me that she will occasionally use Zofran 4mg  which does not feel as though it helps.  Has also been prescribed Pantoprazole 40 mg which she does not take because it makes her feel like she has more reflux.    Denies fever, chills, vomiting, abdominal pain or symptoms that awaken her from sleep.  Past Medical History:  Diagnosis Date  . Acute kidney injury ([place])   . Aortic stenosis, mild 11/17/2013  . Arthritis    "both knees, spine, back, fingers" (11/29/2013)  . CHF (congestive heart failure) ([place])   . Edema   . GERD (gastroesophageal reflux disease)   . Heart murmur   . History of diastolic dysfunction    Echo 08/1759 with diastolic dysfunction  . HTN (hypertension)   . Morbid obesity ([place])   . OA (osteoarthritis)   . PAC (premature atrial contraction)    per prior Holter  . Palpitations   . Pneumonia    "as a child"  . PVC's (premature ventricular contractions)    per prior Holter  . SVT (supraventricular tachycardia) ([place])   . Tachycardia    noted at 07/28/11 visit. Started on beta blocker. Possible atrial flutter vs long PR tachycardia/AVNRT  . Type II diabetes mellitus ([place])     Past Surgical History:  Procedure Laterality Date  . CESAREAN SECTION  1985  . CESAREAN SECTION  [place]  11/29/2013  . SUPRAVENTRICULAR TACHYCARDIA ABLATION N/A 11/29/2013   Procedure: SUPRAVENTRICULAR TACHYCARDIA ABLATION;  Surgeon: Evans Lance, MD;  Location: [facility] CATH LAB;  Service: Cardiovascular;  Laterality: N/A;  . US ECHOCARDIOGRAPHY  07/25/2008   EF 55-60%  . VAGINAL DELIVERY      Current Outpatient Medications  Medication Sig Dispense Refill  . acetaminophen (TYLENOL) 325 MG tablet Take 2 tablets (650 mg total) by mouth every 6 (six) hours as needed for mild pain (or Fever >/= 101).    Marland Kitchen aspirin EC 81 MG tablet Take 81 mg by mouth daily with breakfast. Takes once or twice weekly    . ferrous sulfate 325 (65  FE) MG tablet Take 1 tablet (325 mg total) by mouth daily. 30 tablet 3  . folic acid (FOLVITE) 1 MG tablet Take 1 tablet (1 mg total) by mouth daily. 30 tablet 3  . losartan (COZAAR) 100 MG tablet Take 1 tablet (100 mg total) by mouth daily. 90 tablet 3  . metFORMIN (GLUCOPHAGE) 500 MG tablet TAKE 1 TABLET BY MOUTH DAILY WITH BREAKFAST. 90 tablet 3  . methocarbamol (ROBAXIN) 500 MG tablet Take 1 tablet (500 mg total) by mouth every 6 (six) hours as needed for muscle spasms. 30 tablet 0  . metoprolol tartrate (LOPRESSOR) 50 MG tablet Take 1 tablet (50 mg total) by mouth 2 (two) times daily. 180 tablet 1  . montelukast (SINGULAIR) 10 MG tablet Take 10 mg by mouth daily. Says she is not taking everyday.  3  . Neomycin-Bacitracin-Polymyxin ([facility] ANTIBIOTIC) 3.5-9257185256 OINT APPLY AFTER CLEANING BEFORE WRAPPING LGS 453.9 g 0  . ondansetron (ZOFRAN) 4 MG tablet Take 1 tablet (4 mg total) by mouth every 8 (eight) hours as needed for nausea or vomiting. 14 tablet 0  . polyethylene glycol (MIRALAX / GLYCOLAX) packet Take 17 g by mouth daily. 14 each 0   No current facility-administered medications for this visit.     Allergies as of 12/17/2017 - Review Complete 11/30/2017  Allergen Reaction Noted  . Oxycodone hcl Nausea And Vomiting 11/24/2010  . Penicillins Itching 11/24/2010    Family History  Problem Relation Age of Onset  . Alzheimer's disease Mother   . Lung cancer Mother   . COPD Father   . Diabetes Maternal Uncle   . Stroke Neg Hx   . Colon cancer Neg Hx     Social History   Socioeconomic History  . Marital status: Single    Spouse name: Not on file  . Number of children: 2  . Years of education: Not on file  . Highest education level: Not on file  Occupational History  . 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 Financial resource strain: Hard  . Food insecurity:    Worry: Often true    Inability: Often true  . Transportation  needs:    Medical: Yes    Non-medical: Yes  Tobacco Use  . Smoking status: Former Smoker    Packs/day: 2.00    Years: 15.00    Pack years: 30.00    Types: Cigarettes    Last attempt to quit: 11/23/1985    Years since quitting: 32.0  . Smokeless tobacco: Never Used  Substance and Sexual Activity  . Alcohol use: No  . Drug use: No  . Sexual activity: Never  Lifestyle  . Physical activity:    Days per week: 1 day  Minutes per session: 60 min  . Stress: To some extent  Relationships  . Social connections:    Talks on phone: More than three times a week    Gets together: More than three times a week    Attends religious service: More than 4 times per year    Active member of club or organization: Not on file    Attends meetings of clubs or organizations: More than 4 times per year    Relationship status: Not on file  . Intimate partner violence:    Fear of current or ex partner: Not on file    Emotionally abused: Not on file    Physically abused: Not on file    Forced sexual activity: Not on file  Other Topics Concern  . Not on file  Social History Narrative  . Not on file    Review of Systems:    Constitutional: No fever or chills Skin: No rash  Cardiovascular: No chest pain Respiratory: No SOB Gastrointestinal: See HPI and otherwise negative Genitourinary: No dysuria Neurological: No headache, dizziness or syncope Musculoskeletal: No new muscle or joint pain Hematologic: No bleeding  Psychiatric: No history of depression or anxiety   Physical Exam:  Vital signs: BP 140/72   Pulse 72   Ht 5\' 11"  (1.803 m)   Wt 240 lb 12.8 oz (109.2 kg)   BMI 33.58 kg/m   Constitutional:   Pleasant overweight AA female appears to be in NAD, Well developed, Well nourished, alert and cooperative Head:  Normocephalic and atraumatic. Eyes:   PEERL, EOMI. No icterus. Conjunctiva pink. Ears:  Normal auditory acuity. Neck:  Supple Throat: Oral cavity and pharynx without  inflammation, swelling or lesion.  Respiratory: Respirations even and unlabored. Lungs clear to auscultation bilaterally.   No wheezes, crackles, or rhonchi.  Cardiovascular: Normal S1, S2. No MRG. Regular rate and rhythm. No peripheral edema, cyanosis or pallor.  Gastrointestinal:  Soft, nondistended, nontender. No rebound or guarding. Normal bowel sounds. No appreciable masses or hepatomegaly. Rectal:  Not performed.  Msk:  Symmetrical without gross deformities. Without edema, no deformity or joint abnormality. Ambulates with walker Neurologic:  Alert and  oriented x4;  grossly normal neurologically.  Skin:   Dry and intact without significant lesions or rashes. Psychiatric: Demonstrates good judgement and reason without abnormal affect or behaviors.  RELEVANT LABS AND IMAGING: CBC    Component Value Date/Time   WBC 12.1 (H) 11/17/2017 1028   RBC 3.69 (L) 11/17/2017 1028   HGB 8.9 Repeated and verified X2. (L) 11/17/2017 1028   HCT 28.1 (L) 11/17/2017 1028   PLT 438.0 (H) 11/17/2017 1028   MCV 76.2 (L) 11/17/2017 1028   MCH 24.3 (L) 11/08/2017 0857   MCHC 31.8 11/17/2017 1028   RDW 20.1 (H) 11/17/2017 1028   LYMPHSABS 2.0 11/08/2017 0857   MONOABS 0.3 11/08/2017 0857   EOSABS 0.1 11/08/2017 0857   BASOSABS 0.1 11/08/2017 0857    CMP     Component Value Date/Time   NA 139 11/17/2017 1028   K 4.4 11/17/2017 1028   CL 107 11/17/2017 1028   CO2 22 11/17/2017 1028   GLUCOSE 114 (H) 11/17/2017 1028   BUN 19 11/17/2017 1028   CREATININE 1.14 11/17/2017 1028   CALCIUM 9.6 11/17/2017 1028   PROT 8.1 11/17/2017 1028   ALBUMIN 3.3 (L) 11/17/2017 1028   AST 16 11/17/2017 1028   ALT 9 11/17/2017 1028   ALKPHOS 66 11/17/2017 1028   BILITOT 0.5 11/17/2017 1028  GFRNONAA 56 (L) 11/09/2017 0504   GFRAA >60 11/09/2017 0504    Assessment: 1.  Nausea: Constant over the past 3 months, patient has not really been on antiemetics consistently so unsure if they are helpful 2.  Decreased  p.o. intake 3.  Weight loss: 30 pounds over the past 3 months, likely related to decreased p.o. intake 4.  Constipation: Patient tells me she has not had a bowel movement in 2 weeks but is passing gas, likely related to decreased p.o. Intake  Plan: 1.  Scheduled patient for an EGD in the [place] with Dr. Bryan Lemma as he had availability sooner than Dr. Carlean Purl.  She is scheduled for Tuesday of next week.  Did discuss risk, benefits, limitations and alternatives and patient agrees to proceed.  She will follow with Dr. Carlean Purl, her primary GI physician, after time procedure as needed. 2.  Recommend the patient schedule her Zofran.  Increased it to 8 mg, she can take two 4 mg tabs at a time, every 6 hours. 3.  Would recommend the patient trying to supplement her diet with Boost and Ensure shakes and continue to try and take in liquids. 4.  Recommend the patient try and take MiraLAX once daily in order to help with bowel movements. 4.  Did discuss with the patient that I would recommend that she try and take all of her prescription medicines even if it is just with a cracker or something else. 6.  Patient to follow in clinic per recommendations from Dr. Bryan Lemma after time of procedure.  [name] Newer, PA-C [facility] 12/17/2017, 9:13 AM  Cc: Hoyt Koch, *

## 2017-12-17 NOTE — Patient Instructions (Signed)
We have sent the following medications to your pharmacy for you to pick up at your convenience: zofran 8 mg every 6 hours   For now you can take two 4 mg tabs every 6 hours  Start Miralax once daily.   You have been scheduled for an endoscopy. Please follow written instructions given to you at your visit today. If you use inhalers (even only as needed), please bring them with you on the day of your procedure. Your physician has requested that you go to www.startemmi.com and enter the access code given to you at your visit today. This web site gives a general overview about your procedure. However, you should still follow specific instructions given to you by our office regarding your preparation for the procedure.

## 2017-12-21 ENCOUNTER — Encounter: Payer: Self-pay | Admitting: Gastroenterology

## 2017-12-21 ENCOUNTER — Ambulatory Visit (AMBULATORY_SURGERY_CENTER): Payer: PPO | Admitting: Gastroenterology

## 2017-12-21 VITALS — BP 136/82 | HR 65 | Temp 98.0°F | Resp 22 | Ht 71.0 in | Wt 240.0 lb

## 2017-12-21 DIAGNOSIS — E119 Type 2 diabetes mellitus without complications: Secondary | ICD-10-CM | POA: Diagnosis not present

## 2017-12-21 DIAGNOSIS — K2990 Gastroduodenitis, unspecified, without bleeding: Secondary | ICD-10-CM

## 2017-12-21 DIAGNOSIS — K2970 Gastritis, unspecified, without bleeding: Secondary | ICD-10-CM | POA: Diagnosis not present

## 2017-12-21 DIAGNOSIS — R112 Nausea with vomiting, unspecified: Secondary | ICD-10-CM | POA: Diagnosis not present

## 2017-12-21 DIAGNOSIS — K319 Disease of stomach and duodenum, unspecified: Secondary | ICD-10-CM | POA: Diagnosis not present

## 2017-12-21 DIAGNOSIS — R1310 Dysphagia, unspecified: Secondary | ICD-10-CM | POA: Diagnosis not present

## 2017-12-21 DIAGNOSIS — I10 Essential (primary) hypertension: Secondary | ICD-10-CM | POA: Diagnosis not present

## 2017-12-21 DIAGNOSIS — K222 Esophageal obstruction: Secondary | ICD-10-CM | POA: Diagnosis not present

## 2017-12-21 DIAGNOSIS — R634 Abnormal weight loss: Secondary | ICD-10-CM | POA: Diagnosis not present

## 2017-12-21 DIAGNOSIS — R1319 Other dysphagia: Secondary | ICD-10-CM

## 2017-12-21 MED ORDER — OMEPRAZOLE 20 MG PO CPDR: 20.0000 mg | DELAYED_RELEASE_CAPSULE | Freq: Two times a day (BID) | ORAL | 0 refills | Status: DC

## 2017-12-21 MED ORDER — SODIUM CHLORIDE 0.9 % IV SOLN
500.0000 mL | Freq: Once | INTRAVENOUS | Status: DC
Start: 2017-12-21 — End: 2017-12-21

## 2017-12-21 NOTE — Progress Notes (Signed)
Report to PACU, RN, vss, BBS= Clear.  

## 2017-12-21 NOTE — Progress Notes (Signed)
CBG rechecked at 1112 was 104. Dr. Bryan Lemma notified. Patient still with nausea and weakness. SM

## 2017-12-21 NOTE — Progress Notes (Signed)
Called to room to assist during endoscopic procedure.  Patient ID and intended procedure confirmed with present staff. Received instructions for my participation in the procedure from the performing physician.  

## 2017-12-21 NOTE — Patient Instructions (Signed)
Discharge instructions given. Full liquid diet today. Resume aspirin at prior dose in 3 days. Biopsies taken. Resume previous medications. YOU HAD AN ENDOSCOPIC PROCEDURE TODAY AT [facility]:   Refer to the procedure report that was given to you for any specific questions about what was found during the examination.  If the procedure report does not answer your questions, please call your gastroenterologist to clarify.  If you requested that your care partner not be given the details of your procedure findings, then the procedure report has been included in a sealed envelope for you to review at your convenience later.  YOU SHOULD EXPECT: Some feelings of bloating in the abdomen. Passage of more gas than usual.  Walking can help get rid of the air that was put into your GI tract during the procedure and reduce the bloating. If you had a lower endoscopy (such as a colonoscopy or flexible sigmoidoscopy) you may notice spotting of blood in your stool or on the toilet paper. If you underwent a bowel prep for your procedure, you may not have a normal bowel movement for a few days.  Please Note:  You might notice some irritation and congestion in your nose or some drainage.  This is from the oxygen used during your procedure.  There is no need for concern and it should clear up in a day or so.  SYMPTOMS TO REPORT IMMEDIATELY:     Following upper endoscopy (EGD)  Vomiting of blood or coffee ground material  New chest pain or pain under the shoulder blades  Painful or persistently difficult swallowing  New shortness of breath  Fever of 100F or higher  Black, tarry-looking stools  For urgent or emergent issues, a gastroenterologist can be reached at any hour by calling [phone].   DIET:  We do recommend a small meal at first, but then you may proceed to your regular diet.  Drink plenty of fluids but you should avoid alcoholic beverages for 24 hours.  ACTIVITY:  You should  plan to take it easy for the rest of today and you should NOT DRIVE or use heavy machinery until tomorrow (because of the sedation medicines used during the test).    FOLLOW UP: Our staff will call the number listed on your records the next business day following your procedure to check on you and address any questions or concerns that you may have regarding the information given to you following your procedure. If we do not reach you, we will leave a message.  However, if you are feeling well and you are not experiencing any problems, there is no need to return our call.  We will assume that you have returned to your regular daily activities without incident.  If any biopsies were taken you will be contacted by phone or by letter within the next 1-3 weeks.  Please call us at [phone] if you have not heard about the biopsies in 3 weeks.    SIGNATURES/CONFIDENTIALITY: You and/or your care partner have signed paperwork which will be entered into your electronic medical record.  These signatures attest to the fact that that the information above on your After Visit Summary has been reviewed and is understood.  Full responsibility of the confidentiality of this discharge information lies with you and/or your care-partner.

## 2017-12-21 NOTE — Op Note (Signed)
[facility] Patient Name: Aritha Huckeba Procedure Date: 12/21/2017 11:31 AM MRN: [id number] Endoscopist: Gerrit Heck , MD Age: 72 Referring MD:  Date of Birth: 03-10-1946 Gender: Female Account #: [id number] Procedure:                Upper GI endoscopy Indications:              Dysphagia, Abnormal UGI series, Nausea with                            vomiting, Weight loss Medicines:                Monitored Anesthesia Care Procedure:                Pre-Anesthesia Assessment:                           - Prior to the procedure, a History and Physical                            was performed, and patient medications and                            allergies were reviewed. The patient's tolerance of                            previous anesthesia was also reviewed. The risks                            and benefits of the procedure and the sedation                            options and risks were discussed with the patient.                            All questions were answered, and informed consent                            was obtained. Prior Anticoagulants: The patient has                            taken aspirin, last dose was 7 days prior to                            procedure. ASA Grade Assessment: III - A patient                            with severe systemic disease. After reviewing the                            risks and benefits, the patient was deemed in                            satisfactory condition to undergo the procedure.  After obtaining informed consent, the endoscope was                            passed under direct vision. Throughout the                            procedure, the patient's blood pressure, pulse, and                            oxygen saturations were monitored continuously. The                            Endoscope was introduced through the mouth, and                            advanced to the second part of  duodenum. The upper                            GI endoscopy was accomplished without difficulty.                            The patient tolerated the procedure well. Scope In: Scope Out: Findings:                 A mild Schatzki ring was found in the lower third                            of the esophagus. A TTS dilator was passed through                            the scope. Dilation with an 18-19-20 mm balloon                            dilator was performed to 20 mm. The dilation site                            was examined and showed mild mucosal disruption                            with single small mucosal rent. This was biopsied                            with a cold forceps for further fracturing of the                            ring. Estimated blood loss was minimal.                           The upper third of the esophagus and middle third                            of the esophagus were normal.  Esophagogastric landmarks were identified: the                            Z-line was found at 38 cm, the gastroesophageal                            junction was found at 38 cm and the site of hiatal                            narrowing was found at 38 cm from the incisors.                           Localized mild inflammation characterized by                            erythema was found in the gastric antrum. Biopsies                            were taken with a cold forceps for Helicobacter                            pylori testing. Estimated blood loss was minimal.                           The gastric fundus, gastric body and pylorus were                            normal. Biopsies were taken with a cold forceps for                            Helicobacter pylori testing. Estimated blood loss                            was minimal.                           The duodenal bulb, first portion of the duodenum                            and second portion of  the duodenum were normal. Complications:            No immediate complications. Estimated Blood Loss:     Estimated blood loss was minimal. Impression:               - Mild Schatzki ring. Dilated with a 20 mm TTS                            balloon and the ring was fractured with a cold                            forceps.                           - Normal upper third of esophagus and  middle third                            of esophagus.                           - Esophagogastric landmarks identified.                           - Gastritis. Biopsied.                           - Normal gastric fundus, gastric body and pylorus.                            Biopsied.                           - Normal duodenal bulb, first portion of the                            duodenum and second portion of the duodenum. Recommendation:           - Patient has a contact number available for                            emergencies. The signs and symptoms of potential                            delayed complications were discussed with the                            patient. Return to normal activities tomorrow.                            Written discharge instructions were provided to the                            patient.                           - Full liquid diet today.                           - Resume aspirin at prior dose in 3 days.                           - Await pathology results. Gerrit Heck, MD 12/21/2017 12:01:59 PM

## 2017-12-21 NOTE — Progress Notes (Signed)
Pt's states no medical or surgical changes since previsit or office visit. Patients blood sugar was 68 on admission. Notified Dr. Bryan Lemma and was told to give her a 1/2amp of D 90 and recheck. Sm

## 2017-12-22 ENCOUNTER — Telehealth: Payer: Self-pay | Admitting: *Deleted

## 2017-12-22 ENCOUNTER — Telehealth: Payer: Self-pay | Admitting: Internal Medicine

## 2017-12-22 MED ORDER — METOCLOPRAMIDE HCL 5 MG PO TABS: 5.0000 mg | ORAL_TABLET | Freq: Two times a day (BID) | ORAL | 0 refills | Status: DC

## 2017-12-22 NOTE — Telephone Encounter (Signed)
Spoke with Dr. Bryan Lemma.  Verbal order for Reglan 5 mg. Twice daily x 2 wks.  Called pt. With information about medication  [facility] [address].  Advised pt.  That Dr. Bryan Lemma wants her to follow up in two weeks with Earnie Larsson.      Or Dr. Carlean Purl.  She verbalized understanding. Marland Kitchen

## 2017-12-22 NOTE — Telephone Encounter (Signed)
Message left

## 2017-12-22 NOTE — Telephone Encounter (Signed)
No answer. Left message to call if questions or concerns. 

## 2017-12-22 NOTE — Telephone Encounter (Signed)
Copied from [place] [id number]. Topic: General - Other >> Dec 22, 2017 11:33 AM Yvette Rack wrote: Reason for CRM: RN Stanton Kidney calling [phone] from [facility] wanting an order for a RX for a glucometer  for her DM education     [id number] [address], [place] [phone] (Phone) [phone] (Fax)

## 2017-12-22 NOTE — Telephone Encounter (Signed)
Pt.called twice today by nurses for post visit report.  She did not answer her phone when called by office.  She called to let us know that she is nauseated, can't eat, spitting up "white foam". She took Zofran 8mg . Earlier today for nausea without relief.  Denies pain, fever, or shortness of breath.  Will notify Dr. Bryan Lemma.

## 2017-12-22 NOTE — Telephone Encounter (Signed)
Called mary back and informed her that patient does not need to be checking sugars regularly

## 2017-12-24 DIAGNOSIS — E11622 Type 2 diabetes mellitus with other skin ulcer: Secondary | ICD-10-CM | POA: Diagnosis not present

## 2017-12-24 DIAGNOSIS — I890 Lymphedema, not elsewhere classified: Secondary | ICD-10-CM | POA: Diagnosis not present

## 2017-12-24 DIAGNOSIS — L97222 Non-pressure chronic ulcer of left calf with fat layer exposed: Secondary | ICD-10-CM | POA: Diagnosis not present

## 2017-12-27 ENCOUNTER — Encounter: Payer: Self-pay | Admitting: Gastroenterology

## 2017-12-31 DIAGNOSIS — E11622 Type 2 diabetes mellitus with other skin ulcer: Secondary | ICD-10-CM | POA: Diagnosis not present

## 2017-12-31 DIAGNOSIS — L97222 Non-pressure chronic ulcer of left calf with fat layer exposed: Secondary | ICD-10-CM | POA: Diagnosis not present

## 2017-12-31 DIAGNOSIS — I890 Lymphedema, not elsewhere classified: Secondary | ICD-10-CM | POA: Diagnosis not present

## 2018-01-05 ENCOUNTER — Ambulatory Visit (INDEPENDENT_AMBULATORY_CARE_PROVIDER_SITE_OTHER): Payer: PPO | Admitting: Physician Assistant

## 2018-01-05 ENCOUNTER — Encounter: Payer: Self-pay | Admitting: Physician Assistant

## 2018-01-05 VITALS — BP 132/82 | HR 70 | Ht 71.0 in | Wt 265.0 lb

## 2018-01-05 DIAGNOSIS — R634 Abnormal weight loss: Secondary | ICD-10-CM | POA: Diagnosis not present

## 2018-01-05 DIAGNOSIS — R112 Nausea with vomiting, unspecified: Secondary | ICD-10-CM | POA: Diagnosis not present

## 2018-01-05 DIAGNOSIS — K5900 Constipation, unspecified: Secondary | ICD-10-CM

## 2018-01-05 NOTE — Progress Notes (Signed)
Chief Complaint: Follow-up nausea and vomiting and weight loss  HPI:    [name] [name] is a 72 year old African-American female, known to Dr. Carlean Purl with a past medical history of CHF, heart murmur, diastolic dysfunction, SVT and multiple others listed below, who returns to clinic today for follow-up of her nausea, vomiting and weight loss.    12/21/2017 EGD with Dr. Bryan Lemma with a mild Schatzki's ring dilated with a 20 mm balloon, normal upper third of the esophagus and middle third of the esophagus, gastritis, normal gastric fundus, body and pylorus and normal duodenal bulb, first portion of duodenum and second portion of the duodenum.  Pathology showed reactive gastropathy.    12/22/2017 patient called the office to express that she was nauseous and could not eat and was spitting up "white foam".  Dr. Bryan Lemma started Reglan 5 mg twice daily x2 weeks.    Today, patient tells me that she has been using her Reglan twice a day, her last day is tomorrow of this medication and she has been feeling completely better and able to eat and has also had a couple of normal bowel movements.  Overall the patient feels complete relief from her symptoms and denies any further problems.  She is worried about what is going to happen when she stops her Reglan.    Denies fever, chills, weight loss, anorexia or further nausea and vomiting.  Past Medical History:  Diagnosis Date  . Acute kidney injury ([place])   . Aortic stenosis, mild 11/17/2013  . Arthritis    "both knees, spine, back, fingers" (11/29/2013)  . CHF (congestive heart failure) ([place])   . Edema   . GERD (gastroesophageal reflux disease)   . Heart murmur   . History of diastolic dysfunction    Echo 06/7626 with diastolic dysfunction  . HTN (hypertension)   . Morbid obesity ([place])   . OA (osteoarthritis)   . PAC (premature atrial contraction)    per prior Holter  . Palpitations   . Pneumonia    "as a child"  . PVC's (premature ventricular  contractions)    per prior Holter  . SVT (supraventricular tachycardia) ([place])   . Tachycardia    noted at 07/28/11 visit. Started on beta blocker. Possible atrial flutter vs long PR tachycardia/AVNRT  . Type II diabetes mellitus ([place])     Past Surgical History:  Procedure Laterality Date  . CESAREAN SECTION  1985  . CESAREAN SECTION  1985  . SUPRAVENTRICULAR TACHYCARDIA ABLATION  11/29/2013  . SUPRAVENTRICULAR TACHYCARDIA ABLATION N/A 11/29/2013   Procedure: SUPRAVENTRICULAR TACHYCARDIA ABLATION;  Surgeon: Evans Lance, MD;  [facility];  Service: Cardiovascular;  Laterality: N/A;  . US ECHOCARDIOGRAPHY  07/25/2008   EF 55-60%  . VAGINAL DELIVERY      Current Outpatient Medications  Medication Sig Dispense Refill  . acetaminophen (TYLENOL) 325 MG tablet Take 2 tablets (650 mg total) by mouth every 6 (six) hours as needed for mild pain (or Fever >/= 101).    Marland Kitchen aspirin EC 81 MG tablet Take 81 mg by mouth daily with breakfast. Takes once or twice weekly    . ferrous sulfate 325 (65 FE) MG tablet Take 1 tablet (325 mg total) by mouth daily. 30 tablet 3  . folic acid (FOLVITE) 1 MG tablet Take 1 tablet (1 mg total) by mouth daily. 30 tablet 3  . losartan (COZAAR) 100 MG tablet Take 1 tablet (100 mg total) by mouth daily. 90 tablet 3  . metFORMIN (  GLUCOPHAGE) 500 MG tablet TAKE 1 TABLET BY MOUTH DAILY WITH BREAKFAST. 90 tablet 3  . methocarbamol (ROBAXIN) 500 MG tablet Take 1 tablet (500 mg total) by mouth every 6 (six) hours as needed for muscle spasms. 30 tablet 0  . metoCLOPramide (REGLAN) 5 MG tablet Take 1 tablet (5 mg total) by mouth 2 (two) times daily for 14 days. 28 tablet 0  . metoprolol tartrate (LOPRESSOR) 50 MG tablet Take 1 tablet (50 mg total) by mouth 2 (two) times daily. 180 tablet 1  . montelukast (SINGULAIR) 10 MG tablet Take 10 mg by mouth daily. Says she is not taking everyday.  3  . Neomycin-Bacitracin-Polymyxin ([facility] ANTIBIOTIC) [phone] OINT APPLY AFTER  CLEANING BEFORE WRAPPING LGS 453.9 g 0  . omeprazole (PRILOSEC) 20 MG capsule Take 1 capsule (20 mg total) by mouth 2 (two) times daily before a meal. 60 capsule 0  . ondansetron (ZOFRAN) 4 MG tablet Take 1 tablet (4 mg total) by mouth every 8 (eight) hours as needed for nausea or vomiting. 14 tablet 0  . ondansetron (ZOFRAN-ODT) 8 MG disintegrating tablet Take 1 tablet (8 mg total) by mouth every 8 (eight) hours as needed for nausea or vomiting. 20 tablet 0  . polyethylene glycol (MIRALAX / GLYCOLAX) packet Take 17 g by mouth daily. 14 each 0   No current facility-administered medications for this visit.     Allergies as of 01/05/2018 - Review Complete 01/05/2018  Allergen Reaction Noted  . Oxycodone hcl Nausea And Vomiting 11/24/2010  . Penicillins Itching 11/24/2010    Family History  Problem Relation Age of Onset  . Alzheimer's disease Mother   . Lung cancer Mother   . COPD Father   . Diabetes Maternal Uncle   . Stroke Neg Hx   . Colon cancer Neg Hx   . Stomach cancer Neg Hx   . Esophageal cancer Neg Hx     Social History   Socioeconomic History  . Marital status: Single    Spouse name: Not on file  . Number of children: 2  . Years of education: Not on file  . Highest education level: Not on file  Occupational History  . 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 Financial resource strain: Hard  . Food insecurity:    Worry: Often true    Inability: Often true  . Transportation needs:    Medical: Yes    Non-medical: Yes  Tobacco Use  . Smoking status: Former Smoker    Packs/day: 2.00    Years: 15.00    Pack years: 30.00    Types: Cigarettes    Last attempt to quit: 11/23/1985    Years since quitting: 32.1  . Smokeless tobacco: Never Used  Substance and Sexual Activity  . Alcohol use: No  . Drug use: No  . Sexual activity: Never  Lifestyle  . Physical activity:    Days per week: 1 day    Minutes per session:  60 min  . Stress: To some extent  Relationships  . Social connections:    Talks on phone: More than three times a week    Gets together: More than three times a week    Attends religious service: More than 4 times per year    Active member of club or organization: Not on file    Attends meetings of clubs or organizations: More than 4 times per year    Relationship status:  Not on file  . Intimate partner violence:    Fear of current or ex partner: Not on file    Emotionally abused: Not on file    Physically abused: Not on file    Forced sexual activity: Not on file  Other Topics Concern  . Not on file  Social History Narrative  . Not on file    Review of Systems:    Constitutional: No weight loss, fever or chills Skin: No rash  Cardiovascular: No chest pain Respiratory: No SOB  Gastrointestinal: See HPI and otherwise negative   Physical Exam:  Vital signs: BP 132/82   Pulse 70   Ht 5\' 11"  (1.803 m)   Wt 265 lb (120.2 kg)   BMI 36.96 kg/m   Constitutional:   Pleasant obese AA female appears to be in NAD, Well developed, Well nourished, alert and cooperative Respiratory: Respirations even and unlabored. Lungs clear to auscultation bilaterally.   No wheezes, crackles, or rhonchi.  Cardiovascular: Normal S1, S2. No MRG. Regular rate and rhythm. No peripheral edema, cyanosis or pallor.  Gastrointestinal:  Soft, nondistended, nontender. No rebound or guarding. Normal bowel sounds. No appreciable masses or hepatomegaly. Psychiatric: Demonstrates good judgement and reason without abnormal affect or behaviors.  MOST RECENT LABS AND IMAGING: CBC    Component Value Date/Time   WBC 12.1 (H) 11/17/2017 1028   RBC 3.69 (L) 11/17/2017 1028   HGB 8.9 Repeated and verified X2. (L) 11/17/2017 1028   HCT 28.1 (L) 11/17/2017 1028   PLT 438.0 (H) 11/17/2017 1028   MCV 76.2 (L) 11/17/2017 1028   MCH 24.3 (L) 11/08/2017 0857   MCHC 31.8 11/17/2017 1028   RDW 20.1 (H) 11/17/2017 1028    LYMPHSABS 2.0 11/08/2017 0857   MONOABS 0.3 11/08/2017 0857   EOSABS 0.1 11/08/2017 0857   BASOSABS 0.1 11/08/2017 0857    CMP     Component Value Date/Time   NA 139 11/17/2017 1028   K 4.4 11/17/2017 1028   CL 107 11/17/2017 1028   CO2 22 11/17/2017 1028   GLUCOSE 114 (H) 11/17/2017 1028   BUN 19 11/17/2017 1028   CREATININE 1.14 11/17/2017 1028   CALCIUM 9.6 11/17/2017 1028   PROT 8.1 11/17/2017 1028   ALBUMIN 3.3 (L) 11/17/2017 1028   AST 16 11/17/2017 1028   ALT 9 11/17/2017 1028   ALKPHOS 66 11/17/2017 1028   BILITOT 0.5 11/17/2017 1028   GFRNONAA 56 (L) 11/09/2017 0504   GFRAA >60 11/09/2017 0504    Assessment: 1.  Nausea and vomiting: No further symptoms on Reglan twice daily, question gastroparesis versus post infectious gastroparesis 2.  Weight loss: Patient is no longer losing weight now that she can eat well 3.  Constipation: Better now after patient is started back on a more regular diet  Plan: 1.  All symptoms improved currently after EGD with dilation of Schatzki's ring as well as Reglan twice daily 2.  Patient will stop her Reglan tomorrow as she was only given a 14-day dose, if her symptoms return would recommend a gastric emptying study for further evaluation of possible gastroparesis. 3.  Patient will call and let us know how she is doing.  She will follow-up as needed with Dr. Carlean Purl or myself.  Ellouise Newer, PA-C [facility] 01/05/2018, 1:46 PM  Cc: Hoyt Koch, *

## 2018-01-07 ENCOUNTER — Encounter (HOSPITAL_BASED_OUTPATIENT_CLINIC_OR_DEPARTMENT_OTHER): Payer: PPO | Attending: Internal Medicine

## 2018-01-07 DIAGNOSIS — L97212 Non-pressure chronic ulcer of right calf with fat layer exposed: Secondary | ICD-10-CM | POA: Insufficient documentation

## 2018-01-07 DIAGNOSIS — I890 Lymphedema, not elsewhere classified: Secondary | ICD-10-CM | POA: Insufficient documentation

## 2018-01-07 DIAGNOSIS — E11622 Type 2 diabetes mellitus with other skin ulcer: Secondary | ICD-10-CM | POA: Insufficient documentation

## 2018-01-07 DIAGNOSIS — L97222 Non-pressure chronic ulcer of left calf with fat layer exposed: Secondary | ICD-10-CM | POA: Insufficient documentation

## 2018-01-07 DIAGNOSIS — I872 Venous insufficiency (chronic) (peripheral): Secondary | ICD-10-CM | POA: Insufficient documentation

## 2018-01-07 DIAGNOSIS — I10 Essential (primary) hypertension: Secondary | ICD-10-CM | POA: Insufficient documentation

## 2018-01-07 DIAGNOSIS — E1136 Type 2 diabetes mellitus with diabetic cataract: Secondary | ICD-10-CM | POA: Insufficient documentation

## 2018-01-10 DIAGNOSIS — I890 Lymphedema, not elsewhere classified: Secondary | ICD-10-CM | POA: Diagnosis not present

## 2018-01-10 DIAGNOSIS — E11622 Type 2 diabetes mellitus with other skin ulcer: Secondary | ICD-10-CM | POA: Diagnosis not present

## 2018-01-10 DIAGNOSIS — I10 Essential (primary) hypertension: Secondary | ICD-10-CM | POA: Diagnosis not present

## 2018-01-10 DIAGNOSIS — L97212 Non-pressure chronic ulcer of right calf with fat layer exposed: Secondary | ICD-10-CM | POA: Diagnosis not present

## 2018-01-10 DIAGNOSIS — I872 Venous insufficiency (chronic) (peripheral): Secondary | ICD-10-CM | POA: Diagnosis not present

## 2018-01-10 DIAGNOSIS — L97222 Non-pressure chronic ulcer of left calf with fat layer exposed: Secondary | ICD-10-CM | POA: Diagnosis not present

## 2018-01-10 DIAGNOSIS — E1136 Type 2 diabetes mellitus with diabetic cataract: Secondary | ICD-10-CM | POA: Diagnosis not present

## 2018-01-14 ENCOUNTER — Telehealth: Payer: Self-pay | Admitting: Internal Medicine

## 2018-01-14 NOTE — Telephone Encounter (Signed)
Copied from [place] [id number]. Topic: Quick Communication - See Telephone Encounter >> Jan 14, 2018 12:09 PM Rutherford Nail, NT wrote: CRM for notification. See Telephone encounter for: 01/14/18. Natasha with [facility] calling and states that she called the patient this morning to schedule a home health visit for today. States that the patient informed her that she was not prepared for nurse to come today and had several things to do today.   CB#: [phone]

## 2018-01-14 NOTE — Telephone Encounter (Signed)
FYI

## 2018-01-17 DIAGNOSIS — I87313 Chronic venous hypertension (idiopathic) with ulcer of bilateral lower extremity: Secondary | ICD-10-CM | POA: Diagnosis not present

## 2018-01-17 DIAGNOSIS — E11622 Type 2 diabetes mellitus with other skin ulcer: Secondary | ICD-10-CM | POA: Diagnosis not present

## 2018-01-17 DIAGNOSIS — I890 Lymphedema, not elsewhere classified: Secondary | ICD-10-CM | POA: Diagnosis not present

## 2018-01-17 DIAGNOSIS — L97212 Non-pressure chronic ulcer of right calf with fat layer exposed: Secondary | ICD-10-CM | POA: Diagnosis not present

## 2018-01-17 DIAGNOSIS — L97222 Non-pressure chronic ulcer of left calf with fat layer exposed: Secondary | ICD-10-CM | POA: Diagnosis not present

## 2018-01-19 DIAGNOSIS — I890 Lymphedema, not elsewhere classified: Secondary | ICD-10-CM | POA: Diagnosis not present

## 2018-01-19 DIAGNOSIS — L97219 Non-pressure chronic ulcer of right calf with unspecified severity: Secondary | ICD-10-CM | POA: Diagnosis not present

## 2018-01-19 DIAGNOSIS — I350 Nonrheumatic aortic (valve) stenosis: Secondary | ICD-10-CM | POA: Diagnosis not present

## 2018-01-19 DIAGNOSIS — Z6837 Body mass index (BMI) 37.0-37.9, adult: Secondary | ICD-10-CM | POA: Diagnosis not present

## 2018-01-19 DIAGNOSIS — D631 Anemia in chronic kidney disease: Secondary | ICD-10-CM | POA: Diagnosis not present

## 2018-01-19 DIAGNOSIS — Z7982 Long term (current) use of aspirin: Secondary | ICD-10-CM | POA: Diagnosis not present

## 2018-01-19 DIAGNOSIS — M170 Bilateral primary osteoarthritis of knee: Secondary | ICD-10-CM | POA: Diagnosis not present

## 2018-01-19 DIAGNOSIS — I5030 Unspecified diastolic (congestive) heart failure: Secondary | ICD-10-CM | POA: Diagnosis not present

## 2018-01-19 DIAGNOSIS — I2510 Atherosclerotic heart disease of native coronary artery without angina pectoris: Secondary | ICD-10-CM | POA: Diagnosis not present

## 2018-01-19 DIAGNOSIS — L97229 Non-pressure chronic ulcer of left calf with unspecified severity: Secondary | ICD-10-CM | POA: Diagnosis not present

## 2018-01-19 DIAGNOSIS — D509 Iron deficiency anemia, unspecified: Secondary | ICD-10-CM | POA: Diagnosis not present

## 2018-01-19 DIAGNOSIS — I471 Supraventricular tachycardia: Secondary | ICD-10-CM | POA: Diagnosis not present

## 2018-01-19 DIAGNOSIS — E1122 Type 2 diabetes mellitus with diabetic chronic kidney disease: Secondary | ICD-10-CM | POA: Diagnosis not present

## 2018-01-19 DIAGNOSIS — M19049 Primary osteoarthritis, unspecified hand: Secondary | ICD-10-CM | POA: Diagnosis not present

## 2018-01-19 DIAGNOSIS — K219 Gastro-esophageal reflux disease without esophagitis: Secondary | ICD-10-CM | POA: Diagnosis not present

## 2018-01-19 DIAGNOSIS — I872 Venous insufficiency (chronic) (peripheral): Secondary | ICD-10-CM | POA: Diagnosis not present

## 2018-01-19 DIAGNOSIS — L03116 Cellulitis of left lower limb: Secondary | ICD-10-CM | POA: Diagnosis not present

## 2018-01-19 DIAGNOSIS — N183 Chronic kidney disease, stage 3 (moderate): Secondary | ICD-10-CM | POA: Diagnosis not present

## 2018-01-19 DIAGNOSIS — D638 Anemia in other chronic diseases classified elsewhere: Secondary | ICD-10-CM | POA: Diagnosis not present

## 2018-01-19 DIAGNOSIS — M479 Spondylosis, unspecified: Secondary | ICD-10-CM | POA: Diagnosis not present

## 2018-01-19 DIAGNOSIS — K224 Dyskinesia of esophagus: Secondary | ICD-10-CM | POA: Diagnosis not present

## 2018-01-19 DIAGNOSIS — I130 Hypertensive heart and chronic kidney disease with heart failure and stage 1 through stage 4 chronic kidney disease, or unspecified chronic kidney disease: Secondary | ICD-10-CM | POA: Diagnosis not present

## 2018-01-19 DIAGNOSIS — Z7984 Long term (current) use of oral hypoglycemic drugs: Secondary | ICD-10-CM | POA: Diagnosis not present

## 2018-01-19 DIAGNOSIS — Z4800 Encounter for change or removal of nonsurgical wound dressing: Secondary | ICD-10-CM | POA: Diagnosis not present

## 2018-01-21 ENCOUNTER — Ambulatory Visit (INDEPENDENT_AMBULATORY_CARE_PROVIDER_SITE_OTHER): Payer: PPO | Admitting: Internal Medicine

## 2018-01-21 ENCOUNTER — Other Ambulatory Visit (INDEPENDENT_AMBULATORY_CARE_PROVIDER_SITE_OTHER): Payer: PPO

## 2018-01-21 ENCOUNTER — Encounter: Payer: Self-pay | Admitting: Internal Medicine

## 2018-01-21 ENCOUNTER — Telehealth: Payer: Self-pay | Admitting: Physician Assistant

## 2018-01-21 ENCOUNTER — Other Ambulatory Visit: Payer: Self-pay | Admitting: Internal Medicine

## 2018-01-21 VITALS — BP 160/80 | HR 94 | Temp 98.3°F | Ht 71.0 in | Wt 288.0 lb

## 2018-01-21 DIAGNOSIS — M25561 Pain in right knee: Principal | ICD-10-CM

## 2018-01-21 DIAGNOSIS — I890 Lymphedema, not elsewhere classified: Secondary | ICD-10-CM | POA: Diagnosis not present

## 2018-01-21 DIAGNOSIS — D649 Anemia, unspecified: Secondary | ICD-10-CM | POA: Diagnosis not present

## 2018-01-21 DIAGNOSIS — G8929 Other chronic pain: Secondary | ICD-10-CM

## 2018-01-21 DIAGNOSIS — M25562 Pain in left knee: Principal | ICD-10-CM

## 2018-01-21 LAB — COMPREHENSIVE METABOLIC PANEL
ALT: 8 U/L (ref 0–35)
AST: 14 U/L (ref 0–37)
Albumin: 3.5 g/dL (ref 3.5–5.2)
Alkaline Phosphatase: 46 U/L (ref 39–117)
BUN: 8 mg/dL (ref 6–23)
CO2: 22 mEq/L (ref 19–32)
Calcium: 9 mg/dL (ref 8.4–10.5)
Chloride: 109 mEq/L (ref 96–112)
Creatinine, Ser: 0.84 mg/dL (ref 0.40–1.20)
GFR: 85.89 mL/min (ref >60.00)
Glucose, Bld: 89 mg/dL (ref 70–99)
Potassium: 3.5 mEq/L (ref 3.5–5.1)
Sodium: 142 mEq/L (ref 135–145)
Total Bilirubin: 0.5 mg/dL (ref 0.2–1.2)
Total Protein: 7.4 g/dL (ref 6.0–8.3)

## 2018-01-21 LAB — CBC
HCT: 29.2 % — ABNORMAL LOW (ref 36.0–46.0)
Hemoglobin: 9.5 g/dL — ABNORMAL LOW (ref 12.0–15.0)
MCHC: 32.5 g/dL (ref 30.0–36.0)
MCV: 81.1 fl (ref 78.0–100.0)
Platelets: 304 10*3/uL (ref 150.0–400.0)
RBC: 3.6 Mil/uL — ABNORMAL LOW (ref 3.87–5.11)
RDW: 21.6 % — ABNORMAL HIGH (ref 11.5–15.5)
WBC: 4.8 10*3/uL (ref 4.0–10.5)

## 2018-01-21 MED ORDER — FUROSEMIDE 40 MG PO TABS: 40.0000 mg | ORAL_TABLET | Freq: Every day | ORAL | 0 refills | Status: DC

## 2018-01-21 NOTE — Patient Instructions (Signed)
We will check the labs today. Hopefully we can stop the folic acid and the ferrous sulfate once we get the labs back.   We have sent in a fluid pill called furosemide to take 1 pill daily for 5 days to help with the fluid in the legs.

## 2018-01-21 NOTE — Progress Notes (Signed)
   Subjective:    Patient ID: [name] [name], female    DOB: [birth year], 72 y.o.   MRN: [id number]  HPI The patient is a 72 YO female coming in for swelling in her legs. She has recently been following with GI and had EGD with dilation and 2 week course of reglan to help with her intractable nausea and vomiting. She is now able to eat normally again and is able to have normal BMs. She has chronic swelling in her legs but this seems excessive compared to normal. She does have pain in her knees due to the excessive swelling. She is still getting them wrapped at the wound center and this helps some. This is going on for about 2 weeks or so now. Overall stable since onset but keeping her from being mobile as much.   Review of Systems  Constitutional: Negative.   Respiratory: Negative for cough, chest tightness and shortness of breath.   Cardiovascular: Positive for leg swelling. Negative for chest pain and palpitations.  Gastrointestinal: Negative for abdominal distention, abdominal pain, constipation, diarrhea, nausea and vomiting.  Musculoskeletal: Positive for arthralgias.  Skin: Negative.   Neurological: Negative.       Objective:   Physical Exam  Constitutional: She is oriented to person, place, and time. She appears well-developed and well-nourished.  HENT:  Head: Normocephalic and atraumatic.  Eyes: EOM are normal.  Neck: Normal range of motion.  Cardiovascular: Normal rate and regular rhythm.  Pulmonary/Chest: Effort normal and breath sounds normal. No respiratory distress. She has no wheezes. She has no rales.  Abdominal: Soft. Bowel sounds are normal. She exhibits no distension. There is no tenderness. There is no rebound.  Musculoskeletal: She exhibits edema.  Significant edema both legs to thighs, slightly more than usual  Neurological: She is alert and oriented to person, place, and time. Coordination normal.  Skin: Skin is warm and dry.   Vitals:   01/21/18 0805  BP: (!)  160/80  Pulse: 94  Temp: 98.3 F (36.8 C)  TempSrc: Oral  SpO2: 97%  Weight: 288 lb (130.6 kg)  Height: 5\' 11"  (1.803 m)      Assessment & Plan:

## 2018-01-21 NOTE — Telephone Encounter (Signed)
The pt has been advised that no one from this office has called her.  I did advise her to call her PCP it looks like she has results from Dr [name].

## 2018-01-21 NOTE — Assessment & Plan Note (Signed)
Checking CBC and adjust as needed.  

## 2018-01-21 NOTE — Assessment & Plan Note (Addendum)
Some worsening lately in her legs. Suspect from increased salt intake since she is actually able to eat again. Checking CBC and CMP today. Adjust as needed. Rx for lasix 1 week course.

## 2018-01-24 DIAGNOSIS — L97819 Non-pressure chronic ulcer of other part of right lower leg with unspecified severity: Secondary | ICD-10-CM | POA: Diagnosis not present

## 2018-01-24 DIAGNOSIS — E11622 Type 2 diabetes mellitus with other skin ulcer: Secondary | ICD-10-CM | POA: Diagnosis not present

## 2018-01-24 DIAGNOSIS — L97222 Non-pressure chronic ulcer of left calf with fat layer exposed: Secondary | ICD-10-CM | POA: Diagnosis not present

## 2018-01-24 DIAGNOSIS — I890 Lymphedema, not elsewhere classified: Secondary | ICD-10-CM | POA: Diagnosis not present

## 2018-01-24 DIAGNOSIS — I87311 Chronic venous hypertension (idiopathic) with ulcer of right lower extremity: Secondary | ICD-10-CM | POA: Diagnosis not present

## 2018-02-01 DIAGNOSIS — E11622 Type 2 diabetes mellitus with other skin ulcer: Secondary | ICD-10-CM | POA: Diagnosis not present

## 2018-02-01 DIAGNOSIS — L97212 Non-pressure chronic ulcer of right calf with fat layer exposed: Secondary | ICD-10-CM | POA: Diagnosis not present

## 2018-02-01 DIAGNOSIS — L97219 Non-pressure chronic ulcer of right calf with unspecified severity: Secondary | ICD-10-CM | POA: Diagnosis not present

## 2018-02-01 DIAGNOSIS — L97229 Non-pressure chronic ulcer of left calf with unspecified severity: Secondary | ICD-10-CM | POA: Diagnosis not present

## 2018-02-01 DIAGNOSIS — I890 Lymphedema, not elsewhere classified: Secondary | ICD-10-CM | POA: Diagnosis not present

## 2018-02-01 DIAGNOSIS — I87313 Chronic venous hypertension (idiopathic) with ulcer of bilateral lower extremity: Secondary | ICD-10-CM | POA: Diagnosis not present

## 2018-02-08 ENCOUNTER — Encounter (HOSPITAL_BASED_OUTPATIENT_CLINIC_OR_DEPARTMENT_OTHER): Payer: PPO | Attending: Internal Medicine

## 2018-02-08 DIAGNOSIS — I10 Essential (primary) hypertension: Secondary | ICD-10-CM | POA: Diagnosis not present

## 2018-02-08 DIAGNOSIS — L97212 Non-pressure chronic ulcer of right calf with fat layer exposed: Secondary | ICD-10-CM | POA: Insufficient documentation

## 2018-02-08 DIAGNOSIS — E11622 Type 2 diabetes mellitus with other skin ulcer: Secondary | ICD-10-CM | POA: Diagnosis not present

## 2018-02-08 DIAGNOSIS — L97222 Non-pressure chronic ulcer of left calf with fat layer exposed: Secondary | ICD-10-CM | POA: Insufficient documentation

## 2018-02-08 DIAGNOSIS — I872 Venous insufficiency (chronic) (peripheral): Secondary | ICD-10-CM | POA: Diagnosis not present

## 2018-02-08 DIAGNOSIS — I890 Lymphedema, not elsewhere classified: Secondary | ICD-10-CM | POA: Insufficient documentation

## 2018-02-08 DIAGNOSIS — I87333 Chronic venous hypertension (idiopathic) with ulcer and inflammation of bilateral lower extremity: Secondary | ICD-10-CM | POA: Diagnosis not present

## 2018-02-08 DIAGNOSIS — L97811 Non-pressure chronic ulcer of other part of right lower leg limited to breakdown of skin: Secondary | ICD-10-CM | POA: Diagnosis not present

## 2018-02-08 DIAGNOSIS — L97822 Non-pressure chronic ulcer of other part of left lower leg with fat layer exposed: Secondary | ICD-10-CM | POA: Diagnosis not present

## 2018-02-10 ENCOUNTER — Ambulatory Visit (INDEPENDENT_AMBULATORY_CARE_PROVIDER_SITE_OTHER): Payer: PPO | Admitting: Orthopedic Surgery

## 2018-02-12 DIAGNOSIS — Z6837 Body mass index (BMI) 37.0-37.9, adult: Secondary | ICD-10-CM | POA: Diagnosis not present

## 2018-02-12 DIAGNOSIS — K224 Dyskinesia of esophagus: Secondary | ICD-10-CM | POA: Diagnosis not present

## 2018-02-12 DIAGNOSIS — I471 Supraventricular tachycardia: Secondary | ICD-10-CM | POA: Diagnosis not present

## 2018-02-12 DIAGNOSIS — N183 Chronic kidney disease, stage 3 (moderate): Secondary | ICD-10-CM | POA: Diagnosis not present

## 2018-02-12 DIAGNOSIS — D631 Anemia in chronic kidney disease: Secondary | ICD-10-CM | POA: Diagnosis not present

## 2018-02-12 DIAGNOSIS — M19049 Primary osteoarthritis, unspecified hand: Secondary | ICD-10-CM | POA: Diagnosis not present

## 2018-02-12 DIAGNOSIS — E1122 Type 2 diabetes mellitus with diabetic chronic kidney disease: Secondary | ICD-10-CM | POA: Diagnosis not present

## 2018-02-12 DIAGNOSIS — I350 Nonrheumatic aortic (valve) stenosis: Secondary | ICD-10-CM | POA: Diagnosis not present

## 2018-02-12 DIAGNOSIS — Z7982 Long term (current) use of aspirin: Secondary | ICD-10-CM | POA: Diagnosis not present

## 2018-02-12 DIAGNOSIS — I130 Hypertensive heart and chronic kidney disease with heart failure and stage 1 through stage 4 chronic kidney disease, or unspecified chronic kidney disease: Secondary | ICD-10-CM | POA: Diagnosis not present

## 2018-02-12 DIAGNOSIS — I872 Venous insufficiency (chronic) (peripheral): Secondary | ICD-10-CM | POA: Diagnosis not present

## 2018-02-12 DIAGNOSIS — Z7984 Long term (current) use of oral hypoglycemic drugs: Secondary | ICD-10-CM | POA: Diagnosis not present

## 2018-02-12 DIAGNOSIS — K219 Gastro-esophageal reflux disease without esophagitis: Secondary | ICD-10-CM | POA: Diagnosis not present

## 2018-02-12 DIAGNOSIS — L03116 Cellulitis of left lower limb: Secondary | ICD-10-CM | POA: Diagnosis not present

## 2018-02-12 DIAGNOSIS — L97229 Non-pressure chronic ulcer of left calf with unspecified severity: Secondary | ICD-10-CM | POA: Diagnosis not present

## 2018-02-12 DIAGNOSIS — M479 Spondylosis, unspecified: Secondary | ICD-10-CM | POA: Diagnosis not present

## 2018-02-12 DIAGNOSIS — L97219 Non-pressure chronic ulcer of right calf with unspecified severity: Secondary | ICD-10-CM | POA: Diagnosis not present

## 2018-02-12 DIAGNOSIS — D509 Iron deficiency anemia, unspecified: Secondary | ICD-10-CM | POA: Diagnosis not present

## 2018-02-12 DIAGNOSIS — I890 Lymphedema, not elsewhere classified: Secondary | ICD-10-CM | POA: Diagnosis not present

## 2018-02-12 DIAGNOSIS — D638 Anemia in other chronic diseases classified elsewhere: Secondary | ICD-10-CM | POA: Diagnosis not present

## 2018-02-12 DIAGNOSIS — Z4800 Encounter for change or removal of nonsurgical wound dressing: Secondary | ICD-10-CM | POA: Diagnosis not present

## 2018-02-12 DIAGNOSIS — I2510 Atherosclerotic heart disease of native coronary artery without angina pectoris: Secondary | ICD-10-CM | POA: Diagnosis not present

## 2018-02-12 DIAGNOSIS — M170 Bilateral primary osteoarthritis of knee: Secondary | ICD-10-CM | POA: Diagnosis not present

## 2018-02-12 DIAGNOSIS — I5030 Unspecified diastolic (congestive) heart failure: Secondary | ICD-10-CM | POA: Diagnosis not present

## 2018-02-13 ENCOUNTER — Other Ambulatory Visit: Payer: Self-pay | Admitting: Internal Medicine

## 2018-02-14 ENCOUNTER — Ambulatory Visit (INDEPENDENT_AMBULATORY_CARE_PROVIDER_SITE_OTHER): Payer: PPO

## 2018-02-14 ENCOUNTER — Ambulatory Visit (INDEPENDENT_AMBULATORY_CARE_PROVIDER_SITE_OTHER): Payer: PPO | Admitting: Orthopedic Surgery

## 2018-02-14 DIAGNOSIS — G8929 Other chronic pain: Secondary | ICD-10-CM

## 2018-02-14 DIAGNOSIS — M1712 Unilateral primary osteoarthritis, left knee: Secondary | ICD-10-CM | POA: Diagnosis not present

## 2018-02-14 DIAGNOSIS — M25561 Pain in right knee: Secondary | ICD-10-CM

## 2018-02-14 DIAGNOSIS — M25562 Pain in left knee: Secondary | ICD-10-CM | POA: Diagnosis not present

## 2018-02-14 DIAGNOSIS — M1711 Unilateral primary osteoarthritis, right knee: Secondary | ICD-10-CM

## 2018-02-15 DIAGNOSIS — I872 Venous insufficiency (chronic) (peripheral): Secondary | ICD-10-CM | POA: Diagnosis not present

## 2018-02-15 DIAGNOSIS — I890 Lymphedema, not elsewhere classified: Secondary | ICD-10-CM | POA: Diagnosis not present

## 2018-02-15 DIAGNOSIS — L97219 Non-pressure chronic ulcer of right calf with unspecified severity: Secondary | ICD-10-CM | POA: Diagnosis not present

## 2018-02-15 DIAGNOSIS — I87333 Chronic venous hypertension (idiopathic) with ulcer and inflammation of bilateral lower extremity: Secondary | ICD-10-CM | POA: Diagnosis not present

## 2018-02-15 DIAGNOSIS — L97819 Non-pressure chronic ulcer of other part of right lower leg with unspecified severity: Secondary | ICD-10-CM | POA: Diagnosis not present

## 2018-02-15 DIAGNOSIS — L97229 Non-pressure chronic ulcer of left calf with unspecified severity: Secondary | ICD-10-CM | POA: Diagnosis not present

## 2018-02-16 ENCOUNTER — Encounter (INDEPENDENT_AMBULATORY_CARE_PROVIDER_SITE_OTHER): Payer: Self-pay | Admitting: Orthopedic Surgery

## 2018-02-16 DIAGNOSIS — M1712 Unilateral primary osteoarthritis, left knee: Secondary | ICD-10-CM

## 2018-02-16 DIAGNOSIS — M25561 Pain in right knee: Secondary | ICD-10-CM | POA: Diagnosis not present

## 2018-02-16 DIAGNOSIS — M1711 Unilateral primary osteoarthritis, right knee: Secondary | ICD-10-CM | POA: Diagnosis not present

## 2018-02-16 DIAGNOSIS — G8929 Other chronic pain: Secondary | ICD-10-CM | POA: Diagnosis not present

## 2018-02-16 DIAGNOSIS — M25562 Pain in left knee: Secondary | ICD-10-CM | POA: Diagnosis not present

## 2018-02-16 MED ORDER — BUPIVACAINE HCL 0.25 % IJ SOLN
4.0000 mL | INTRAMUSCULAR | Status: AC | PRN
  Administered 2018-02-16: 4 mL via INTRA_ARTICULAR

## 2018-02-16 MED ORDER — METHYLPREDNISOLONE ACETATE 40 MG/ML IJ SUSP
40.0000 mg | INTRAMUSCULAR | Status: AC | PRN
  Administered 2018-02-16: 40 mg via INTRA_ARTICULAR

## 2018-02-16 MED ORDER — LIDOCAINE HCL 1 % IJ SOLN
5.0000 mL | INTRAMUSCULAR | Status: AC | PRN
  Administered 2018-02-16: 5 mL

## 2018-02-16 NOTE — Progress Notes (Signed)
Office Visit Note   Patient: [name] [name]           Date of Birth: [birth year]           MRN: [id number] Visit Date: 02/14/2018 Requested by: Hoyt Koch, MD [place], [place] [id number] PCP: Hoyt Koch, MD  Subjective: Chief Complaint  Patient presents with  . Left Knee - Pain  . Right Knee - Pain    HPI: Patient presents with bilateral knee pain for 4 years left worse than right.  She has lower extremity edema and she gets her legs wrapped daily.  She states that she also has to get her legs "pump".  She reports popping and grinding in both knees.  Stiffness in both knees if she sits too long.  She ambulates with a rolling walker.  She reports pain radiating down the left leg from the knee when she stands from a seated position.  She had an injection years ago.  She uses a walker.  Her walking endurance is less from this clinic exam room to the parking lot.              ROS: All systems reviewed are negative as they relate to the chief complaint within the history of present illness.  Patient denies  fevers or chills.   Assessment & Plan: Visit Diagnoses:  1. Chronic pain of right knee   2. Chronic pain of left knee   3. Unilateral primary osteoarthritis, left knee   4. Unilateral primary osteoarthritis, right knee     Plan: Impression is bilateral knee arthritis left worse than right.  Plan is that she is not an operative candidate due to the edema in her legs.  We will try an injection in the left knee today.  If that helps and we can try an injection in the right knee in 1 to 2 weeks.  I will see her back as needed  Follow-Up Instructions: Return if symptoms worsen or fail to improve.   Orders:  Orders Placed This Encounter  Procedures  . XR KNEE 3 VIEW LEFT  . XR KNEE 3 VIEW RIGHT   No orders of the defined types were placed in this encounter.     Procedures: Large Joint Inj: L knee on 02/16/2018 5:38 PM Indications: diagnostic  evaluation, joint swelling and pain Details: 18 G 1.5 in needle, superolateral approach  Arthrogram: No  Medications: 5 mL lidocaine 1 %; 40 mg methylPREDNISolone acetate 40 MG/ML; 4 mL bupivacaine 0.25 % Outcome: tolerated well, no immediate complications Procedure, treatment alternatives, risks and benefits explained, specific risks discussed. Consent was given by the patient. Immediately prior to procedure a time out was called to verify the correct patient, procedure, equipment, support staff and site/side marked as required. Patient was prepped and draped in the usual sterile fashion.       Clinical Data: No additional findings.  Objective: Vital Signs: There were no vitals taken for this visit.  Physical Exam:   Constitutional: Patient appears well-developed HEENT:  Head: Normocephalic Eyes:EOM are normal Neck: Normal range of motion Cardiovascular: Normal rate Pulmonary/chest: Effort normal Neurologic: Patient is alert Skin: Skin is warm Psychiatric: Patient has normal mood and affect    Ortho Exam: Ortho exam demonstrates full active and passive range of motion of the ankles and hips but she does have some edema in general soft tissue swelling in bilateral lower extremities.  Some type of Unna boots on both of her  calves.  She has trace effusion both knees and flexion to about 90.  Flexion contracture is about 10 degrees.  Extensor mechanism is intact bilaterally  Specialty Comments:  No specialty comments available.  Imaging: No results found.   PMFS History: Patient Active Problem List   Diagnosis Date Noted  . Obesity, Class II, BMI 35-39.9, isolated (see actual BMI) 11/08/2017  . CKD (chronic kidney disease), stage III ([place]) 11/08/2017  . Anemia 11/07/2017  . Thrombocytosis ([place]) 11/07/2017  . Lymphedema 11/07/2017  . AKI (acute kidney injury) ([place]) 10/16/2017  . Routine general medical examination at a health care facility 05/10/2014  . Paroxysmal SVT  (supraventricular tachycardia) ([place]) 11/28/2013  . Physical deconditioning 11/23/2013  . Generalized weakness 11/17/2013  . Nausea with vomiting 11/17/2013  . Multilevel spine pain 11/17/2013  . Aortic stenosis, mild 11/17/2013  . Cellulitis of left lower extremity 11/17/2013  . DM type 2 (diabetes mellitus, type 2) ([place]) 02/02/2013  . Arthritis 10/19/2012  . Mixed incontinence 10/19/2012  . GERD (gastroesophageal reflux disease) 04/20/2011  . HTN (hypertension) 01/20/2011  . Morbid obesity ([place])    Past Medical History:  Diagnosis Date  . Acute kidney injury ([place])   . Aortic stenosis, mild 11/17/2013  . Arthritis    "both knees, spine, back, fingers" (11/29/2013)  . CHF (congestive heart failure) ([place])   . Edema   . GERD (gastroesophageal reflux disease)   . Heart murmur   . History of diastolic dysfunction    Echo 09/2692 with diastolic dysfunction  . HTN (hypertension)   . Morbid obesity ([place])   . OA (osteoarthritis)   . PAC (premature atrial contraction)    per prior Holter  . Palpitations   . Pneumonia    "as a child"  . PVC's (premature ventricular contractions)    per prior Holter  . SVT (supraventricular tachycardia) ([place])   . Tachycardia    noted at 07/28/11 visit. Started on beta blocker. Possible atrial flutter vs long PR tachycardia/AVNRT  . Type II diabetes mellitus ([facility])     Family History  Problem Relation Age of Onset  . Alzheimer's disease Mother   . Lung cancer Mother   . COPD Father   . Diabetes Maternal Uncle   . Stroke Neg Hx   . Colon cancer Neg Hx   . Stomach cancer Neg Hx   . Esophageal cancer Neg Hx     Past Surgical History:  Procedure Laterality Date  . CESAREAN SECTION  1985  . CESAREAN SECTION  1985  . SUPRAVENTRICULAR TACHYCARDIA ABLATION  11/29/2013  . SUPRAVENTRICULAR TACHYCARDIA ABLATION N/A 11/29/2013   Procedure: SUPRAVENTRICULAR TACHYCARDIA ABLATION;  Surgeon: Evans Lance, MD;  Location: [facility] CATH LAB;  Service: Cardiovascular;   Laterality: N/A;  . US ECHOCARDIOGRAPHY  07/25/2008   EF 55-60%  . VAGINAL DELIVERY     Social History   Occupational History  . 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 Smoking status: Former Smoker    Packs/day: 2.00    Years: 15.00    Pack years: 30.00    Types: Cigarettes    Last attempt to quit: 11/23/1985    Years since quitting: 32.2  . Smokeless tobacco: Never Used  Substance and Sexual Activity  . Alcohol use: No  . Drug use: No  . Sexual activity: Never

## 2018-03-04 ENCOUNTER — Other Ambulatory Visit: Payer: Self-pay | Admitting: Internal Medicine

## 2018-03-08 ENCOUNTER — Ambulatory Visit: Payer: PPO | Admitting: Podiatry

## 2018-03-16 ENCOUNTER — Ambulatory Visit: Payer: PPO | Admitting: Podiatry

## 2018-03-16 ENCOUNTER — Encounter: Payer: Self-pay | Admitting: Podiatry

## 2018-03-16 DIAGNOSIS — M79675 Pain in left toe(s): Secondary | ICD-10-CM | POA: Diagnosis not present

## 2018-03-16 DIAGNOSIS — B351 Tinea unguium: Secondary | ICD-10-CM

## 2018-03-16 DIAGNOSIS — E1151 Type 2 diabetes mellitus with diabetic peripheral angiopathy without gangrene: Secondary | ICD-10-CM

## 2018-03-16 DIAGNOSIS — E1142 Type 2 diabetes mellitus with diabetic polyneuropathy: Secondary | ICD-10-CM | POA: Diagnosis not present

## 2018-03-16 DIAGNOSIS — M79674 Pain in right toe(s): Secondary | ICD-10-CM

## 2018-03-16 NOTE — Patient Instructions (Signed)

## 2018-03-16 NOTE — Progress Notes (Signed)
Subjective: [name] [name] presents today with diabetes, diabetic neuropathy and PAD with cc of painful, discolored, thick toenails which interfere with daily activities. Pain is aggravated when wearing enclosed shoe gear. Pain is getting progressively worse and relieved with periodic professional debridement.   [name] relates she uses pumps for her chronic LE edema. She was released from [facility] about two weeks ago and states her legs are leaking again today, so she will be calling the [facility] back.  PCP: Dr. [name]; last visit 01/21/2018   Current Outpatient Medications:  .  acetaminophen (TYLENOL) 325 MG tablet, Take 2 tablets (650 mg total) by mouth every 6 (six) hours as needed for mild pain (or Fever >/= 101)., Disp: , Rfl:  .  aspirin EC 81 MG tablet, Take 81 mg by mouth daily with breakfast. Takes once or twice weekly, Disp: , Rfl:  .  ferrous sulfate 325 (65 FE) MG tablet, Take 1 tablet (325 mg total) by mouth daily., Disp: 30 tablet, Rfl: 3 .  folic acid (FOLVITE) 1 MG tablet, Take 1 tablet (1 mg total) by mouth daily., Disp: 30 tablet, Rfl: 3 .  furosemide (LASIX) 40 MG tablet, TAKE 1 TABLET BY MOUTH EVERY DAY, Disp: 30 tablet, Rfl: 3 .  losartan (COZAAR) 100 MG tablet, Take 1 tablet (100 mg total) by mouth daily., Disp: 90 tablet, Rfl: 3 .  metFORMIN (GLUCOPHAGE) 500 MG tablet, TAKE 1 TABLET BY MOUTH DAILY WITH BREAKFAST., Disp: 90 tablet, Rfl: 3 .  methocarbamol (ROBAXIN) 500 MG tablet, Take 1 tablet (500 mg total) by mouth every 6 (six) hours as needed for muscle spasms., Disp: 30 tablet, Rfl: 0 .  metoprolol tartrate (LOPRESSOR) 50 MG tablet, Take 1 tablet (50 mg total) by mouth 2 (two) times daily., Disp: 180 tablet, Rfl: 1 .  montelukast (SINGULAIR) 10 MG tablet, Take 10 mg by mouth daily. Says she is not taking everyday., Disp: , Rfl: 3 .  Neomycin-Bacitracin-Polymyxin ([employer] ANTIBIOTIC) [phone] OINT, APPLY AFTER CLEANING BEFORE WRAPPING LGS,  Disp: 453.9 g, Rfl: 0 .  ondansetron (ZOFRAN) 4 MG tablet, Take 1 tablet (4 mg total) by mouth every 8 (eight) hours as needed for nausea or vomiting., Disp: 14 tablet, Rfl: 0 .  ondansetron (ZOFRAN-ODT) 8 MG disintegrating tablet, Take 1 tablet (8 mg total) by mouth every 8 (eight) hours as needed for nausea or vomiting., Disp: 20 tablet, Rfl: 0 .  polyethylene glycol (MIRALAX / GLYCOLAX) packet, Take 17 g by mouth daily., Disp: 14 each, Rfl: 0 .  omeprazole (PRILOSEC) 20 MG capsule, Take 1 capsule (20 mg total) by mouth 2 (two) times daily before a meal., Disp: 60 capsule, Rfl: 0  Allergies  Allergen Reactions  . Oxycodone Hcl Nausea And Vomiting  . Penicillins Itching    Has patient had a PCN reaction causing immediate rash, facial/tongue/throat swelling, SOB or lightheadedness with hypotension: No Has patient had a PCN reaction causing severe rash involving mucus membranes or skin necrosis: No Has patient had a PCN reaction that required hospitalization: No Has patient had a PCN reaction occurring within the last 10 years: No If all of the above answers are "NO", then may proceed with Cephalosporin use.    Objective: Vascular Examination: Capillary refill time <5 seconds x 10 digits Dorsalis pedis pulses absent b/l Posterior tibial pulses absent b/l No digital hair x 10 digits Skin temperature unable to assess proximal to distal due to leg wraps, but exposed digits are cool, but not ischemic.  Dermatological Examination:  BLE leg wraps to level of forefoot  Exposed skin shows normal turgor, texture and tone b/l.  Toenails 1-5 b/l discolored, thick, dystrophic with subungual debris and pain with palpation to nailbeds due to thickness of nails.  Exposed feet/digits exhibit no wounds.  Musculoskeletal: Muscle strength 5/5 to all LE muscle groups  Neurological: Sensation diminished with 10 gram monofilament. Vibratory sensation diminished  Assessment: 1. Painful  onychomycosis toenails 1-5 b/l 2. NIDDM with Peripheral arterial disease 3. Diabetic neuropathy  Plan: 1. Discuss diabetic foot care principles. Literature dispensed. 2. Toenails 1-5 b/l were debrided in length and girth without iatrogenic bleeding. 3. Patient to continue soft, supportive shoe gear 4. Patient to report any pedal injuries to medical professional  5. Follow up 3 months. Patient/POA to call should there be a concern in the interim.

## 2018-04-15 ENCOUNTER — Other Ambulatory Visit: Payer: Self-pay | Admitting: Internal Medicine

## 2018-04-15 DIAGNOSIS — I471 Supraventricular tachycardia: Secondary | ICD-10-CM

## 2018-05-12 NOTE — Progress Notes (Signed)
Subjective:   [name] [name] is a 72 y.o. female who presents for Medicare Annual (Subsequent) preventive examination.  Review of Systems:  No ROS.  Medicare Wellness Visit. Additional risk factors are reflected in the social history.  Cardiac Risk Factors include: advanced age (>23men, >22 women);diabetes mellitus;dyslipidemia;hypertension;obesity (BMI >30kg/m2) Sleep patterns: feels rested on waking, gets up 2-3 times nightly to void and sleeps 4-5 hours nightly. Patient reports insomnia issues, discussed recommended sleep tips and stress reduction tips, education was attached to patient's AVS.  Home Safety/Smoke Alarms: Feels safe in home. Smoke alarms in place.  Living environment; residence and Adult nurse: apartment, equipment: Walkers, Type: Wellsite geologist, Type: Tub Surveyor, quantity. Lives alone, no needs for DME, limited support system.  Seat Belt Safety/Bike Helmet: Wears seat belt.     Objective:     Vitals: BP (!) 118/58   Pulse 71   Resp 17   Ht 5\' 11"  (1.803 m)   Wt 272 lb (123.4 kg)   SpO2 99%   BMI 37.94 kg/m   Body mass index is 37.94 kg/m.  Advanced Directives 05/13/2018 11/06/2017 11/06/2017 10/16/2017 10/16/2017 05/20/2017 05/11/2017  Does Patient Have a Medical Advance Directive? No No No No No No No  Would patient like information on creating a medical advance directive? Yes (ED - Information included in AVS) No - Patient declined No - Patient declined No - Patient declined No - Patient declined No - Patient declined Yes (ED - Information included in AVS)    Tobacco Social History   Tobacco Use  Smoking Status Former Smoker  . Packs/day: 2.00  . Years: 15.00  . Pack years: 30.00  . Types: Cigarettes  . Last attempt to quit: 11/23/1985  . Years since quitting: 32.4  Smokeless Tobacco Never Used     Counseling given: Not Answered  Past Medical History:  Diagnosis Date  . Acute kidney injury ([place])   . Aortic stenosis, mild 11/17/2013  .  Arthritis    "both knees, spine, back, fingers" (11/29/2013)  . CHF (congestive heart failure) ([place])   . Edema   . GERD (gastroesophageal reflux disease)   . Heart murmur   . History of diastolic dysfunction    Echo 08/9739 with diastolic dysfunction  . HTN (hypertension)   . Morbid obesity ([place])   . OA (osteoarthritis)   . PAC (premature atrial contraction)    per prior Holter  . Palpitations   . Pneumonia    "as a child"  . PVC's (premature ventricular contractions)    per prior Holter  . SVT (supraventricular tachycardia) ([place])   . Tachycardia    noted at 07/28/11 visit. Started on beta blocker. Possible atrial flutter vs long PR tachycardia/AVNRT  . Type II diabetes mellitus ([place])    Past Surgical History:  Procedure Laterality Date  . CESAREAN SECTION  1985  . CESAREAN SECTION  1985  . SUPRAVENTRICULAR TACHYCARDIA ABLATION  11/29/2013  . SUPRAVENTRICULAR TACHYCARDIA ABLATION N/A 11/29/2013   Procedure: SUPRAVENTRICULAR TACHYCARDIA ABLATION;  Surgeon: Evans Lance, MD;  Location[facility] CATH LAB;  Service: Cardiovascular;  Laterality: N/A;  . US ECHOCARDIOGRAPHY  07/25/2008   EF 55-60%  . VAGINAL DELIVERY     Family History  Problem Relation Age of Onset  . Alzheimer's disease Mother   . Lung cancer Mother   . COPD Father   . Diabetes Maternal Uncle   . Stroke Neg Hx   . Colon cancer Neg Hx   . Stomach cancer Neg  Hx   . Esophageal cancer Neg Hx    Social History   Socioeconomic History  . Marital status: Single    Spouse name: Not on file  . Number of children: 2  . Years of education: Not on file  . Highest education level: Not on file  Occupational History  . 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 Financial resource strain: Somewhat hard  . Food insecurity:    Worry: Sometimes true    Inability: Sometimes true  . Transportation needs:    Medical: No    Non-medical: No  Tobacco Use  . Smoking status:  Former Smoker    Packs/day: 2.00    Years: 15.00    Pack years: 30.00    Types: Cigarettes    Last attempt to quit: 11/23/1985    Years since quitting: 32.4  . Smokeless tobacco: Never Used  Substance and Sexual Activity  . Alcohol use: No  . Drug use: No  . Sexual activity: Never  Lifestyle  . Physical activity:    Days per week: 3 days    Minutes per session: 50 min  . Stress: Only a little  Relationships  . Social connections:    Talks on phone: More than three times a week    Gets together: More than three times a week    Attends religious service: More than 4 times per year    Active member of club or organization: Yes    Attends meetings of clubs or organizations: More than 4 times per year    Relationship status: Not on file  Other Topics Concern  . Not on file  Social History Narrative  . Not on file    Outpatient Encounter Medications as of 05/13/2018  Medication Sig  . acetaminophen (TYLENOL) 325 MG tablet Take 2 tablets (650 mg total) by mouth every 6 (six) hours as needed for mild pain (or Fever >/= 101).  Marland Kitchen aspirin EC 81 MG tablet Take 81 mg by mouth daily with breakfast. Takes once or twice weekly  . furosemide (LASIX) 40 MG tablet TAKE 1 TABLET BY MOUTH EVERY DAY  . losartan (COZAAR) 100 MG tablet Take 1 tablet (100 mg total) by mouth daily.  . metFORMIN (GLUCOPHAGE) 500 MG tablet TAKE 1 TABLET BY MOUTH DAILY WITH BREAKFAST.  . methocarbamol (ROBAXIN) 500 MG tablet Take 1 tablet (500 mg total) by mouth every 6 (six) hours as needed for muscle spasms.  . metoprolol tartrate (LOPRESSOR) 50 MG tablet TAKE 1 TABLET BY MOUTH TWICE A DAY  . montelukast (SINGULAIR) 10 MG tablet Take 10 mg by mouth daily. Says she is not taking everyday.  Marland Kitchen Neomycin-Bacitracin-Polymyxin ([employer] ANTIBIOTIC) [phone] OINT APPLY AFTER CLEANING BEFORE WRAPPING LGS  . ondansetron (ZOFRAN) 4 MG tablet Take 1 tablet (4 mg total) by mouth every 8 (eight) hours as needed for nausea or vomiting.    . ondansetron (ZOFRAN-ODT) 8 MG disintegrating tablet Take 1 tablet (8 mg total) by mouth every 8 (eight) hours as needed for nausea or vomiting.  . polyethylene glycol (MIRALAX / GLYCOLAX) packet Take 17 g by mouth daily.  Marland Kitchen omeprazole (PRILOSEC) 20 MG capsule Take 1 capsule (20 mg total) by mouth 2 (two) times daily before a meal.  . [DISCONTINUED] ferrous sulfate 325 (65 FE) MG tablet Take 1 tablet (325 mg total) by mouth daily. (Patient not taking: Reported on 05/13/2018)  . [DISCONTINUED] folic acid (FOLVITE) 1  MG tablet Take 1 tablet (1 mg total) by mouth daily. (Patient not taking: Reported on 05/13/2018)   No facility-administered encounter medications on file as of 05/13/2018.     Activities of Daily Living In your present state of health, do you have any difficulty performing the following activities: 05/13/2018 11/06/2017  Hearing? Y N  Vision? N N  Difficulty concentrating or making decisions? N N  Walking or climbing stairs? Y Y  Dressing or bathing? N N  Doing errands, shopping? N N  Preparing Food and eating ? N -  Using the Toilet? N -  In the past six months, have you accidently leaked urine? Y -  Do you have problems with loss of bowel control? N -  Managing your Medications? N -  Managing your Finances? N -  Housekeeping or managing your Housekeeping? N -  Some recent data might be hidden    Patient Care Team: Hoyt Koch, MD as PCP - General (Internal Medicine) [place], Peter M, MD as Consulting Physician (Cardiology) Marzetta Board, DPM as Consulting Physician (Podiatry) [facility]    Assessment:   This is a routine wellness examination for [place]. Physical assessment deferred to PCP.  Exercise Activities and Dietary recommendations Current Exercise Habits: Structured exercise class, Type of exercise: walking(chair exercises through [facility] ), Time (Minutes): 50, Frequency (Times/Week): 3, Weekly Exercise (Minutes/Week):  150, Intensity: Mild, Exercise limited by: orthopedic condition(s)  Diet (meal preparation, eat out, water intake, caffeinated beverages, dairy products, fruits and vegetables): in general, a "healthy" diet     Reviewed heart healthy and diabetic diet. Encouraged patient to increase daily water and healthy fluid intake.  Goals    . Patient Stated     Monitor diet for sugar, carbohydrates, and salt. Continue to be as active as possible socially and physically by getting out daily.        Fall Risk Fall Risk  05/13/2018 10/11/2017 09/10/2017 08/02/2017 06/02/2017  Falls in the past year? 1 Yes Yes Yes No  Number falls in past yr: 0 1 1 1  -  Injury with Fall? 0 No No No -  Risk Factor Category  - - - - -  Comment - - - - -  Risk for fall due to : Impaired balance/gait;Impaired mobility History of fall(s);Impaired balance/gait;Impaired mobility History of fall(s);Impaired balance/gait;Impaired mobility History of fall(s);Impaired balance/gait;Impaired mobility Impaired balance/gait;Impaired mobility  Risk for fall due to: Comment - - - - -  Follow up Falls prevention discussed - Falls evaluation completed;Falls prevention discussed;Education provided Falls evaluation completed;Falls prevention discussed;Education provided -    Depression Screen PHQ 2/9 Scores 05/13/2018 05/13/2018 10/11/2017 09/10/2017  PHQ - 2 Score 0 0 0 0  PHQ- 9 Score 3 - - -     Cognitive Function MMSE - Mini Mental State Exam 05/11/2017  Orientation to time 5  Orientation to Place 5  Registration 3  Attention/ Calculation 4  Recall 2  Language- name 2 objects 2  Language- repeat 1  Language- follow 3 step command 3  Language- read & follow direction 1  Write a sentence 1  Copy design 1  Total score 28       Ad8 score reviewed for issues:  Issues making decisions: no  Less interest in hobbies / activities: no  Repeats questions, stories (family complaining): no  Trouble using ordinary gadgets (microwave,  computer, phone):no  Forgets the month or year: no  Mismanaging finances: no  Remembering appts: no  Daily problems with thinking and/or memory: no Ad8 score is= 0  Immunization History  Administered Date(s) Administered  . Influenza, High Dose Seasonal PF 01/23/2013, 03/03/2016  . Influenza,inj,Quad PF,6+ Mos 01/05/2014, 03/05/2015  . Influenza-Unspecified 01/18/2017, 01/17/2018  . Pneumococcal Conjugate-13 07/24/2014  . Pneumococcal Polysaccharide-23 10/19/2012  . Tdap 10/19/2012   Screening Tests Health Maintenance  Topic Date Due  . Hepatitis C Screening  08/26/1945  . OPHTHALMOLOGY EXAM  02/21/2018  . HEMOGLOBIN A1C  04/13/2018  . FOOT EXAM  03/17/2019  . MAMMOGRAM  07/01/2019  . TETANUS/TDAP  10/20/2022  . COLONOSCOPY  07/30/2024  . INFLUENZA VACCINE  Completed  . DEXA SCAN  Completed  . PNA vac Low Risk Adult  Completed      Plan:     Scheduled a PCP appointment on 05/18/18. Patient wants to discuss knee and back pain, concerns about dehydration and glucose monitoring.  Reviewed health maintenance screenings with patient today and relevant education, vaccines, and/or referrals were provided.   Continue doing brain stimulating activities (puzzles, reading, adult coloring books, staying active) to keep memory sharp.   Continue to eat heart healthy diet (full of fruits, vegetables, whole grains, lean protein, water--limit salt, fat, and sugar intake) and increase physical activity as tolerated.  I have personally reviewed and noted the following in the patient's chart:   . Medical and social history . Use of alcohol, tobacco or illicit drugs  . Current medications and supplements . Functional ability and status . Nutritional status . Physical activity . Advanced directives . List of other physicians . Vitals . Screenings to include cognitive, depression, and falls . Referrals and appointments  In addition, I have reviewed and discussed with patient  certain preventive protocols, quality metrics, and best practice recommendations. A written personalized care plan for preventive services as well as general preventive health recommendations were provided to patient.     Michiel Cowboy, RN  05/13/2018

## 2018-05-13 ENCOUNTER — Ambulatory Visit (INDEPENDENT_AMBULATORY_CARE_PROVIDER_SITE_OTHER): Payer: PPO | Admitting: *Deleted

## 2018-05-13 VITALS — BP 118/58 | HR 71 | Resp 17 | Ht 71.0 in | Wt 272.0 lb

## 2018-05-13 DIAGNOSIS — Z0000 Encounter for general adult medical examination without abnormal findings: Secondary | ICD-10-CM | POA: Diagnosis not present

## 2018-05-13 NOTE — Patient Instructions (Signed)
Continue doing brain stimulating activities (puzzles, reading, adult coloring books, staying active) to keep memory sharp.   Continue to eat heart healthy diet (full of fruits, vegetables, whole grains, lean protein, water--limit salt, fat, and sugar intake) and increase physical activity as tolerated.  Health Maintenance, Female Adopting a healthy lifestyle and getting preventive care can go a long way to promote health and wellness. Talk with your health care provider about what schedule of regular examinations is right for you. This is a good chance for you to check in with your provider about disease prevention and staying healthy. In between checkups, there are plenty of things you can do on your own. Experts have done a lot of research about which lifestyle changes and preventive measures are most likely to keep you healthy. Ask your health care provider for more information. Weight and diet Eat a healthy diet  Be sure to include plenty of vegetables, fruits, low-fat dairy products, and lean protein.  Do not eat a lot of foods high in solid fats, added sugars, or salt.  Get regular exercise. This is one of the most important things you can do for your health. ? Most adults should exercise for at least 150 minutes each week. The exercise should increase your heart rate and make you sweat (moderate-intensity exercise). ? Most adults should also do strengthening exercises at least twice a week. This is in addition to the moderate-intensity exercise. Maintain a healthy weight  Body mass index (BMI) is a measurement that can be used to identify possible weight problems. It estimates body fat based on height and weight. Your health care provider can help determine your BMI and help you achieve or maintain a healthy weight.  For females 20 years of age and older: ? A BMI below 18.5 is considered underweight. ? A BMI of 18.5 to 24.9 is normal. ? A BMI of 25 to 29.9 is considered  overweight. ? A BMI of 30 and above is considered obese. Watch levels of cholesterol and blood lipids  You should start having your blood tested for lipids and cholesterol at 72 years of age, then have this test every 5 years.  You may need to have your cholesterol levels checked more often if: ? Your lipid or cholesterol levels are high. ? You are older than 72 years of age. ? You are at high risk for heart disease. Cancer screening Lung Cancer  Lung cancer screening is recommended for adults 55-80 years old who are at high risk for lung cancer because of a history of smoking.  A yearly low-dose CT scan of the lungs is recommended for people who: ? Currently smoke. ? Have quit within the past 15 years. ? Have at least a 30-pack-year history of smoking. A pack year is smoking an average of one pack of cigarettes a day for 1 year.  Yearly screening should continue until it has been 15 years since you quit.  Yearly screening should stop if you develop a health problem that would prevent you from having lung cancer treatment. Breast Cancer  Practice breast self-awareness. This means understanding how your breasts normally appear and feel.  It also means doing regular breast self-exams. Let your health care provider know about any changes, no matter how small.  If you are in your 20s or 30s, you should have a clinical breast exam (CBE) by a health care provider every 1-3 years as part of a regular health exam.  If you are 40 or   older, have a CBE every year. Also consider having a breast X-ray (mammogram) every year.  If you have a family history of breast cancer, talk to your health care provider about genetic screening.  If you are at high risk for breast cancer, talk to your health care provider about having an MRI and a mammogram every year.  Breast cancer gene (BRCA) assessment is recommended for women who have family members with BRCA-related cancers. BRCA-related cancers  include: ? Breast. ? Ovarian. ? Tubal. ? Peritoneal cancers.  Results of the assessment will determine the need for genetic counseling and BRCA1 and BRCA2 testing. Cervical Cancer Your health care provider may recommend that you be screened regularly for cancer of the pelvic organs (ovaries, uterus, and vagina). This screening involves a pelvic examination, including checking for microscopic changes to the surface of your cervix (Pap test). You may be encouraged to have this screening done every 3 years, beginning at age 21.  For women ages 30-65, health care providers may recommend pelvic exams and Pap testing every 3 years, or they may recommend the Pap and pelvic exam, combined with testing for human papilloma virus (HPV), every 5 years. Some types of HPV increase your risk of cervical cancer. Testing for HPV may also be done on women of any age with unclear Pap test results.  Other health care providers may not recommend any screening for nonpregnant women who are considered low risk for pelvic cancer and who do not have symptoms. Ask your health care provider if a screening pelvic exam is right for you.  If you have had past treatment for cervical cancer or a condition that could lead to cancer, you need Pap tests and screening for cancer for at least 20 years after your treatment. If Pap tests have been discontinued, your risk factors (such as having a new sexual partner) need to be reassessed to determine if screening should resume. Some women have medical problems that increase the chance of getting cervical cancer. In these cases, your health care provider may recommend more frequent screening and Pap tests. Colorectal Cancer  This type of cancer can be detected and often prevented.  Routine colorectal cancer screening usually begins at 72 years of age and continues through 72 years of age.  Your health care provider may recommend screening at an earlier age if you have risk factors for  colon cancer.  Your health care provider may also recommend using home test kits to check for hidden blood in the stool.  A small camera at the end of a tube can be used to examine your colon directly (sigmoidoscopy or colonoscopy). This is done to check for the earliest forms of colorectal cancer.  Routine screening usually begins at age 50.  Direct examination of the colon should be repeated every 5-10 years through 72 years of age. However, you may need to be screened more often if early forms of precancerous polyps or small growths are found. Skin Cancer  Check your skin from head to toe regularly.  Tell your health care provider about any new moles or changes in moles, especially if there is a change in a mole's shape or color.  Also tell your health care provider if you have a mole that is larger than the size of a pencil eraser.  Always use sunscreen. Apply sunscreen liberally and repeatedly throughout the day.  Protect yourself by wearing long sleeves, pants, a wide-brimmed hat, and sunglasses whenever you are outside. Heart disease, diabetes,   and high blood pressure  High blood pressure causes heart disease and increases the risk of stroke. High blood pressure is more likely to develop in: ? People who have blood pressure in the high end of the normal range (130-139/85-89 mm Hg). ? People who are overweight or obese. ? People who are African American.  If you are 18-39 years of age, have your blood pressure checked every 3-5 years. If you are 40 years of age or older, have your blood pressure checked every year. You should have your blood pressure measured twice-once when you are at a hospital or clinic, and once when you are not at a hospital or clinic. Record the average of the two measurements. To check your blood pressure when you are not at a hospital or clinic, you can use: ? An automated blood pressure machine at a pharmacy. ? A home blood pressure monitor.  If you are  between 55 years and 79 years old, ask your health care provider if you should take aspirin to prevent strokes.  Have regular diabetes screenings. This involves taking a blood sample to check your fasting blood sugar level. ? If you are at a normal weight and have a low risk for diabetes, have this test once every three years after 72 years of age. ? If you are overweight and have a high risk for diabetes, consider being tested at a younger age or more often. Preventing infection Hepatitis B  If you have a higher risk for hepatitis B, you should be screened for this virus. You are considered at high risk for hepatitis B if: ? You were born in a country where hepatitis B is common. Ask your health care provider which countries are considered high risk. ? Your parents were born in a high-risk country, and you have not been immunized against hepatitis B (hepatitis B vaccine). ? You have HIV or AIDS. ? You use needles to inject street drugs. ? You live with someone who has hepatitis B. ? You have had sex with someone who has hepatitis B. ? You get hemodialysis treatment. ? You take certain medicines for conditions, including cancer, organ transplantation, and autoimmune conditions. Hepatitis C  Blood testing is recommended for: ? Everyone born from 1945 through 1965. ? Anyone with known risk factors for hepatitis C. Sexually transmitted infections (STIs)  You should be screened for sexually transmitted infections (STIs) including gonorrhea and chlamydia if: ? You are sexually active and are younger than 72 years of age. ? You are older than 72 years of age and your health care provider tells you that you are at risk for this type of infection. ? Your sexual activity has changed since you were last screened and you are at an increased risk for chlamydia or gonorrhea. Ask your health care provider if you are at risk.  If you do not have HIV, but are at risk, it may be recommended that you take  a prescription medicine daily to prevent HIV infection. This is called pre-exposure prophylaxis (PrEP). You are considered at risk if: ? You are sexually active and do not regularly use condoms or know the HIV status of your partner(s). ? You take drugs by injection. ? You are sexually active with a partner who has HIV. Talk with your health care provider about whether you are at high risk of being infected with HIV. If you choose to begin PrEP, you should first be tested for HIV. You should then be tested every   3 months for as long as you are taking PrEP. Pregnancy  If you are premenopausal and you may become pregnant, ask your health care provider about preconception counseling.  If you may become pregnant, take 400 to 800 micrograms (mcg) of folic acid every day.  If you want to prevent pregnancy, talk to your health care provider about birth control (contraception). Osteoporosis and menopause  Osteoporosis is a disease in which the bones lose minerals and strength with aging. This can result in serious bone fractures. Your risk for osteoporosis can be identified using a bone density scan.  If you are 42 years of age or older, or if you are at risk for osteoporosis and fractures, ask your health care provider if you should be screened.  Ask your health care provider whether you should take a calcium or vitamin D supplement to lower your risk for osteoporosis.  Menopause may have certain physical symptoms and risks.  Hormone replacement therapy may reduce some of these symptoms and risks. Talk to your health care provider about whether hormone replacement therapy is right for you. Follow these instructions at home:  Schedule regular health, dental, and eye exams.  Stay current with your immunizations.  Do not use any tobacco products including cigarettes, chewing tobacco, or electronic cigarettes.  If you are pregnant, do not drink alcohol.  If you are breastfeeding, limit how  much and how often you drink alcohol.  Limit alcohol intake to no more than 1 drink per day for nonpregnant women. One drink equals 12 ounces of beer, 5 ounces of wine, or 1 ounces of hard liquor.  Do not use street drugs.  Do not share needles.  Ask your health care provider for help if you need support or information about quitting drugs.  Tell your health care provider if you often feel depressed.  Tell your health care provider if you have ever been abused or do not feel safe at home. This information is not intended to replace advice given to you by your health care provider. Make sure you discuss any questions you have with your health care provider. Document Released: 10/06/2010 Document Revised: 08/29/2015 Document Reviewed: 12/25/2014 Elsevier Interactive Patient Education  2019 [place].  Insomnia Insomnia is a sleep disorder that makes it difficult to fall asleep or stay asleep. Insomnia can cause fatigue, low energy, difficulty concentrating, mood swings, and poor performance at work or school. There are three different ways to classify insomnia:  Difficulty falling asleep.  Difficulty staying asleep.  Waking up too early in the morning. Any type of insomnia can be long-term (chronic) or short-term (acute). Both are common. Short-term insomnia usually lasts for three months or less. Chronic insomnia occurs at least three times a week for longer than three months. What are the causes? Insomnia may be caused by another condition, situation, or substance, such as:  Anxiety.  Certain medicines.  Gastroesophageal reflux disease (GERD) or other gastrointestinal conditions.  Asthma or other breathing conditions.  Restless legs syndrome, sleep apnea, or other sleep disorders.  Chronic pain.  Menopause.  Stroke.  Abuse of alcohol, tobacco, or illegal drugs.  Mental health conditions, such as depression.  Caffeine.  Neurological disorders, such as  Alzheimer's disease.  An overactive thyroid (hyperthyroidism). Sometimes, the cause of insomnia may not be known. What increases the risk? Risk factors for insomnia include:  Gender. Women are affected more often than men.  Age. Insomnia is more common as you get older.  Stress.  Lack  of exercise.  Irregular work schedule or working night shifts.  Traveling between different time zones.  Certain medical and mental health conditions. What are the signs or symptoms? If you have insomnia, the main symptom is having trouble falling asleep or having trouble staying asleep. This may lead to other symptoms, such as:  Feeling fatigued or having low energy.  Feeling nervous about going to sleep.  Not feeling rested in the morning.  Having trouble concentrating.  Feeling irritable, anxious, or depressed. How is this diagnosed? This condition may be diagnosed based on:  Your symptoms and medical history. Your health care provider may ask about: ? Your sleep habits. ? Any medical conditions you have. ? Your mental health.  A physical exam. How is this treated? Treatment for insomnia depends on the cause. Treatment may focus on treating an underlying condition that is causing insomnia. Treatment may also include:  Medicines to help you sleep.  Counseling or therapy.  Lifestyle adjustments to help you sleep better. Follow these instructions at home: Eating and drinking   Limit or avoid alcohol, caffeinated beverages, and cigarettes, especially close to bedtime. These can disrupt your sleep.  Do not eat a large meal or eat spicy foods right before bedtime. This can lead to digestive discomfort that can make it hard for you to sleep. Sleep habits   Keep a sleep diary to help you and your health care provider figure out what could be causing your insomnia. Write down: ? When you sleep. ? When you wake up during the night. ? How well you sleep. ? How rested you feel the  next day. ? Any side effects of medicines you are taking. ? What you eat and drink.  Make your bedroom a dark, comfortable place where it is easy to fall asleep. ? Put up shades or blackout curtains to block light from outside. ? Use a white noise machine to block noise. ? Keep the temperature cool.  Limit screen use before bedtime. This includes: ? Watching TV. ? Using your smartphone, tablet, or computer.  Stick to a routine that includes going to bed and waking up at the same times every day and night. This can help you fall asleep faster. Consider making a quiet activity, such as reading, part of your nighttime routine.  Try to avoid taking naps during the day so that you sleep better at night.  Get out of bed if you are still awake after 15 minutes of trying to sleep. Keep the lights down, but try reading or doing a quiet activity. When you feel sleepy, go back to bed. General instructions  Take over-the-counter and prescription medicines only as told by your health care provider.  Exercise regularly, as told by your health care provider. Avoid exercise starting several hours before bedtime.  Use relaxation techniques to manage stress. Ask your health care provider to suggest some techniques that may work well for you. These may include: ? Breathing exercises. ? Routines to release muscle tension. ? Visualizing peaceful scenes.  Make sure that you drive carefully. Avoid driving if you feel very sleepy.  Keep all follow-up visits as told by your health care provider. This is important. Contact a health care provider if:  You are tired throughout the day.  You have trouble in your daily routine due to sleepiness.  You continue to have sleep problems, or your sleep problems get worse. Get help right away if:  You have serious thoughts about hurting yourself or someone  else. If you ever feel like you may hurt yourself or others, or have thoughts about taking your own life,  get help right away. You can go to your nearest emergency department or call:  Your local emergency services (911 in the [place]  A suicide crisis helpline, such as the [facility] at 763-861-9749. This is open 24 hours a day. Summary  Insomnia is a sleep disorder that makes it difficult to fall asleep or stay asleep.  Insomnia can be long-term (chronic) or short-term (acute).  Treatment for insomnia depends on the cause. Treatment may focus on treating an underlying condition that is causing insomnia.  Keep a sleep diary to help you and your health care provider figure out what could be causing your insomnia. This information is not intended to replace advice given to you by your health care provider. Make sure you discuss any questions you have with your health care provider. Document Released: 03/20/2000 Document Revised: 12/31/2016 Document Reviewed: 12/31/2016 Elsevier Interactive Patient Education  2019 [employer].

## 2018-05-13 NOTE — Progress Notes (Signed)
Medical screening examination/treatment/procedure(s) were performed by non-physician practitioner and as supervising physician I was immediately available for consultation/collaboration. I agree with above. Elizabeth A Crawford, MD 

## 2018-05-18 ENCOUNTER — Ambulatory Visit: Payer: PPO | Admitting: Internal Medicine

## 2018-06-13 ENCOUNTER — Other Ambulatory Visit: Payer: Self-pay | Admitting: Internal Medicine

## 2018-06-15 ENCOUNTER — Ambulatory Visit: Payer: PPO | Admitting: Podiatry

## 2018-07-06 ENCOUNTER — Telehealth: Payer: Self-pay | Admitting: Internal Medicine

## 2018-07-06 NOTE — Telephone Encounter (Signed)
Copied from [place] [id number]. Topic: General - Other >> Jul 06, 2018  3:25 PM Valla Leaver wrote: Reason for CRM: Coleen w/ [facility] (case manangement) calling because during e-visit with the patient, she had drainage and odor from wounds but unable to get in touch with wound clinic and wants to know if Dr. [name] can order home health for wound care?

## 2018-07-07 ENCOUNTER — Other Ambulatory Visit: Payer: Self-pay | Admitting: Internal Medicine

## 2018-07-07 NOTE — Telephone Encounter (Signed)
Called colleen and informed of MD response will try and see if patient can do a virtual visit. [facility] did a telephone visit with patient

## 2018-07-07 NOTE — Telephone Encounter (Signed)
Pt is unable to do virtual. We will call patient back if a telephone call will suffice legally to order home health, she was also told to call the wound center.

## 2018-07-07 NOTE — Telephone Encounter (Signed)
LVM informing patient of MD response and to call back and let us know if she is able to do a virtual visit.

## 2018-07-07 NOTE — Telephone Encounter (Signed)
Legally I cannot order home health as she has no face to face within 90 days. We can do virtual visit and order home health from that encounter if she is able

## 2018-07-08 ENCOUNTER — Other Ambulatory Visit: Payer: Self-pay | Admitting: Nurse Practitioner

## 2018-07-08 DIAGNOSIS — L2089 Other atopic dermatitis: Secondary | ICD-10-CM

## 2018-07-08 NOTE — Telephone Encounter (Signed)
LVM with MD response  

## 2018-07-08 NOTE — Telephone Encounter (Signed)
Talked and home health timeline cannot be changed and telephone encounter is not sufficient for home health orders unfortunately.

## 2018-07-19 DIAGNOSIS — L820 Inflamed seborrheic keratosis: Secondary | ICD-10-CM | POA: Diagnosis not present

## 2018-07-19 DIAGNOSIS — H1045 Other chronic allergic conjunctivitis: Secondary | ICD-10-CM | POA: Diagnosis not present

## 2018-07-19 DIAGNOSIS — H11823 Conjunctivochalasis, bilateral: Secondary | ICD-10-CM | POA: Diagnosis not present

## 2018-07-19 LAB — HM DIABETES EYE EXAM

## 2018-07-20 ENCOUNTER — Encounter: Payer: Self-pay | Admitting: Internal Medicine

## 2018-07-20 NOTE — Progress Notes (Signed)
Abstracted and sent to scan  

## 2018-08-16 DIAGNOSIS — H25813 Combined forms of age-related cataract, bilateral: Secondary | ICD-10-CM | POA: Diagnosis not present

## 2018-08-16 DIAGNOSIS — E119 Type 2 diabetes mellitus without complications: Secondary | ICD-10-CM | POA: Diagnosis not present

## 2018-08-16 DIAGNOSIS — H353131 Nonexudative age-related macular degeneration, bilateral, early dry stage: Secondary | ICD-10-CM | POA: Diagnosis not present

## 2018-10-13 ENCOUNTER — Other Ambulatory Visit: Payer: Self-pay | Admitting: Internal Medicine

## 2018-10-13 DIAGNOSIS — I471 Supraventricular tachycardia: Secondary | ICD-10-CM

## 2018-10-20 ENCOUNTER — Telehealth: Payer: Self-pay | Admitting: Internal Medicine

## 2018-10-20 ENCOUNTER — Other Ambulatory Visit: Payer: Self-pay | Admitting: Internal Medicine

## 2018-10-20 DIAGNOSIS — I10 Essential (primary) hypertension: Secondary | ICD-10-CM

## 2018-10-20 MED ORDER — LOSARTAN POTASSIUM 25 MG PO TABS: 100.0000 mg | ORAL_TABLET | Freq: Every day | ORAL | 3 refills | Status: DC

## 2018-10-20 NOTE — Telephone Encounter (Signed)
Called pharmacy and stated that they have not 100mg , very spotty getting the 50mg  in and have like 30tablets of those. They do get the 25 mg in a lot more than any of the other dosages, they have 180 tablets available today

## 2018-10-20 NOTE — Telephone Encounter (Signed)
[name] [name] with [facility] calling states that losartan (COZAAR) 100 MG tablet is on backorder and wants to know if they can change to: olmesartan or telmisartan. [name] [name] can be reached at [phone]

## 2018-10-20 NOTE — Telephone Encounter (Signed)
Do they have any other dosages of losartan?

## 2018-10-20 NOTE — Telephone Encounter (Signed)
Sent in 1 month supply of losartan 25 mg, take 4 pills daily.

## 2018-10-20 NOTE — Telephone Encounter (Signed)
LVM informing patient of MD response  

## 2018-12-08 ENCOUNTER — Other Ambulatory Visit: Payer: Self-pay | Admitting: Internal Medicine

## 2018-12-29 ENCOUNTER — Telehealth: Payer: Self-pay | Admitting: Internal Medicine

## 2018-12-29 NOTE — Telephone Encounter (Signed)
[name] [name]- case Freight forwarder with pt's insurance is calling in to request home health services. A home assessment was completed today on pt, pt has bilateral lower ext remedy edema with  increase drainage, with a foul odor.   They would like to have an urgent  Referral placed for home health services.   Apt with wound clinic in October   CB: [phone] - please assist [name] [name]

## 2018-12-29 NOTE — Telephone Encounter (Signed)
We do not have any face to face to justify this. She would need this for home health orders per medicare would need prior to order.

## 2018-12-30 ENCOUNTER — Other Ambulatory Visit: Payer: Self-pay | Admitting: Internal Medicine

## 2018-12-30 NOTE — Telephone Encounter (Signed)
I have left pt vm to call back to schedule.

## 2019-01-06 ENCOUNTER — Encounter: Payer: Self-pay | Admitting: Internal Medicine

## 2019-01-06 ENCOUNTER — Emergency Department (HOSPITAL_COMMUNITY): Payer: PPO

## 2019-01-06 ENCOUNTER — Telehealth: Payer: Self-pay

## 2019-01-06 ENCOUNTER — Encounter (HOSPITAL_COMMUNITY): Payer: Self-pay | Admitting: Emergency Medicine

## 2019-01-06 ENCOUNTER — Ambulatory Visit (INDEPENDENT_AMBULATORY_CARE_PROVIDER_SITE_OTHER): Payer: PPO | Admitting: Internal Medicine

## 2019-01-06 ENCOUNTER — Inpatient Hospital Stay (HOSPITAL_COMMUNITY)
Admission: EM | Admit: 2019-01-06 | Discharge: 2019-01-09 | DRG: 683 | Disposition: A | Discharge status: Home / Self Care | Payer: PPO | Attending: Internal Medicine | Admitting: Internal Medicine

## 2019-01-06 ENCOUNTER — Inpatient Hospital Stay (HOSPITAL_COMMUNITY): Payer: PPO

## 2019-01-06 ENCOUNTER — Other Ambulatory Visit: Payer: Self-pay

## 2019-01-06 ENCOUNTER — Other Ambulatory Visit (INDEPENDENT_AMBULATORY_CARE_PROVIDER_SITE_OTHER): Payer: PPO

## 2019-01-06 VITALS — BP 90/50 | HR 80 | Temp 97.6°F | Ht 71.0 in | Wt 244.0 lb

## 2019-01-06 DIAGNOSIS — Z880 Allergy status to penicillin: Secondary | ICD-10-CM | POA: Diagnosis not present

## 2019-01-06 DIAGNOSIS — I491 Atrial premature depolarization: Secondary | ICD-10-CM | POA: Diagnosis present

## 2019-01-06 DIAGNOSIS — E119 Type 2 diabetes mellitus without complications: Secondary | ICD-10-CM

## 2019-01-06 DIAGNOSIS — R1111 Vomiting without nausea: Secondary | ICD-10-CM | POA: Diagnosis not present

## 2019-01-06 DIAGNOSIS — E875 Hyperkalemia: Secondary | ICD-10-CM | POA: Diagnosis not present

## 2019-01-06 DIAGNOSIS — Z7984 Long term (current) use of oral hypoglycemic drugs: Secondary | ICD-10-CM

## 2019-01-06 DIAGNOSIS — Z79899 Other long term (current) drug therapy: Secondary | ICD-10-CM

## 2019-01-06 DIAGNOSIS — I471 Supraventricular tachycardia: Secondary | ICD-10-CM | POA: Diagnosis not present

## 2019-01-06 DIAGNOSIS — E1122 Type 2 diabetes mellitus with diabetic chronic kidney disease: Secondary | ICD-10-CM | POA: Diagnosis present

## 2019-01-06 DIAGNOSIS — D539 Nutritional anemia, unspecified: Secondary | ICD-10-CM | POA: Diagnosis not present

## 2019-01-06 DIAGNOSIS — K219 Gastro-esophageal reflux disease without esophagitis: Secondary | ICD-10-CM | POA: Diagnosis present

## 2019-01-06 DIAGNOSIS — N390 Urinary tract infection, site not specified: Secondary | ICD-10-CM | POA: Diagnosis not present

## 2019-01-06 DIAGNOSIS — R110 Nausea: Secondary | ICD-10-CM

## 2019-01-06 DIAGNOSIS — M1990 Unspecified osteoarthritis, unspecified site: Secondary | ICD-10-CM | POA: Diagnosis present

## 2019-01-06 DIAGNOSIS — Z801 Family history of malignant neoplasm of trachea, bronchus and lung: Secondary | ICD-10-CM

## 2019-01-06 DIAGNOSIS — I890 Lymphedema, not elsewhere classified: Secondary | ICD-10-CM | POA: Diagnosis present

## 2019-01-06 DIAGNOSIS — Z820 Family history of epilepsy and other diseases of the nervous system: Secondary | ICD-10-CM

## 2019-01-06 DIAGNOSIS — I130 Hypertensive heart and chronic kidney disease with heart failure and stage 1 through stage 4 chronic kidney disease, or unspecified chronic kidney disease: Secondary | ICD-10-CM | POA: Diagnosis present

## 2019-01-06 DIAGNOSIS — Z833 Family history of diabetes mellitus: Secondary | ICD-10-CM | POA: Diagnosis not present

## 2019-01-06 DIAGNOSIS — N170 Acute kidney failure with tubular necrosis: Secondary | ICD-10-CM | POA: Diagnosis not present

## 2019-01-06 DIAGNOSIS — I10 Essential (primary) hypertension: Secondary | ICD-10-CM

## 2019-01-06 DIAGNOSIS — Z7982 Long term (current) use of aspirin: Secondary | ICD-10-CM

## 2019-01-06 DIAGNOSIS — Z825 Family history of asthma and other chronic lower respiratory diseases: Secondary | ICD-10-CM | POA: Diagnosis not present

## 2019-01-06 DIAGNOSIS — I509 Heart failure, unspecified: Secondary | ICD-10-CM | POA: Diagnosis not present

## 2019-01-06 DIAGNOSIS — Z87891 Personal history of nicotine dependence: Secondary | ICD-10-CM | POA: Diagnosis not present

## 2019-01-06 DIAGNOSIS — N181 Chronic kidney disease, stage 1: Secondary | ICD-10-CM | POA: Diagnosis present

## 2019-01-06 DIAGNOSIS — R0602 Shortness of breath: Secondary | ICD-10-CM | POA: Diagnosis not present

## 2019-01-06 DIAGNOSIS — Z20828 Contact with and (suspected) exposure to other viral communicable diseases: Secondary | ICD-10-CM | POA: Diagnosis not present

## 2019-01-06 DIAGNOSIS — N179 Acute kidney failure, unspecified: Secondary | ICD-10-CM

## 2019-01-06 DIAGNOSIS — E860 Dehydration: Secondary | ICD-10-CM | POA: Diagnosis not present

## 2019-01-06 LAB — SARS CORONAVIRUS 2 BY RT PCR (HOSPITAL ORDER, PERFORMED IN ~~LOC~~ HOSPITAL LAB): SARS Coronavirus 2: NEGATIVE

## 2019-01-06 LAB — COMPREHENSIVE METABOLIC PANEL
ALT: 11 U/L (ref 0–35)
AST: 17 U/L (ref 0–37)
Albumin: 3.6 g/dL (ref 3.5–5.2)
Alkaline Phosphatase: 71 U/L (ref 39–117)
BUN: 67 mg/dL — ABNORMAL HIGH (ref 6–23)
CO2: 16 mEq/L — ABNORMAL LOW (ref 19–32)
Calcium: 9.7 mg/dL (ref 8.4–10.5)
Chloride: 105 mEq/L (ref 96–112)
Creatinine, Ser: 4.44 mg/dL — ABNORMAL HIGH (ref 0.40–1.20)
GFR: 11.8 mL/min — CL (ref >60.00)
Glucose, Bld: 110 mg/dL — ABNORMAL HIGH (ref 70–99)
Potassium: 5.7 mEq/L — ABNORMAL HIGH (ref 3.5–5.1)
Sodium: 136 mEq/L (ref 135–145)
Total Bilirubin: 0.4 mg/dL (ref 0.2–1.2)
Total Protein: 8.4 g/dL — ABNORMAL HIGH (ref 6.0–8.3)

## 2019-01-06 LAB — BASIC METABOLIC PANEL
Anion gap: 10 (ref 5–15)
BUN: 73 mg/dL — ABNORMAL HIGH (ref 8–23)
CO2: 15 mmol/L — ABNORMAL LOW (ref 22–32)
Calcium: 8.6 mg/dL — ABNORMAL LOW (ref 8.9–10.3)
Chloride: 110 mmol/L (ref 98–111)
Creatinine, Ser: 4.73 mg/dL — ABNORMAL HIGH (ref 0.44–1.00)
GFR calc Af Amer: 10 mL/min — ABNORMAL LOW (ref >60)
GFR calc non Af Amer: 9 mL/min — ABNORMAL LOW (ref >60)
Glucose, Bld: 102 mg/dL — ABNORMAL HIGH (ref 70–99)
Potassium: 6 mmol/L — ABNORMAL HIGH (ref 3.5–5.1)
Sodium: 135 mmol/L (ref 135–145)

## 2019-01-06 LAB — CBC
HCT: 32.8 % — ABNORMAL LOW (ref 36.0–46.0)
HCT: 37.6 % (ref 36.0–46.0)
Hemoglobin: 10.6 g/dL — ABNORMAL LOW (ref 12.0–15.0)
Hemoglobin: 11.7 g/dL — ABNORMAL LOW (ref 12.0–15.0)
MCH: 24.1 pg — ABNORMAL LOW (ref 26.0–34.0)
MCHC: 32.2 g/dL (ref 30.0–36.0)
MCHC: 31.1 g/dL (ref 30.0–36.0)
MCV: 75.4 fl — ABNORMAL LOW (ref 78.0–100.0)
MCV: 77.5 fL — ABNORMAL LOW (ref 80.0–100.0)
Platelets: 469 10*3/uL — ABNORMAL HIGH (ref 150.0–400.0)
Platelets: 355 10*3/uL (ref 150–400)
RBC: 4.35 Mil/uL (ref 3.87–5.11)
RBC: 4.85 MIL/uL (ref 3.87–5.11)
RDW: 18.8 % — ABNORMAL HIGH (ref 11.5–15.5)
RDW: 18.1 % — ABNORMAL HIGH (ref 11.5–15.5)
WBC: 9.2 10*3/uL (ref 4.0–10.5)
WBC: 6.5 10*3/uL (ref 4.0–10.5)
nRBC: 0 % (ref 0.0–0.2)

## 2019-01-06 LAB — LACTIC ACID, PLASMA
Lactic Acid, Venous: 2 mmol/L (ref 0.5–1.9)
Lactic Acid, Venous: 1.4 mmol/L (ref 0.5–1.9)

## 2019-01-06 LAB — LIPASE: Lipase: 27 U/L (ref 11.0–59.0)

## 2019-01-06 LAB — CBG MONITORING, ED: Glucose-Capillary: 90 mg/dL (ref 70–99)

## 2019-01-06 IMAGING — CT CT ABD-PELV W/O CM
2 of 4 series · 16 of 46 positions shown, 18 images · non-contrast
Comparison: [DATE]

CLINICAL DATA: Nausea, vomiting

EXAM:
CT ABDOMEN AND PELVIS WITHOUT CONTRAST
TECHNIQUE: Multidetector CT imaging of the abdomen and pelvis was performed
following the standard protocol without IV contrast.

[Series 2: axial st · axial · 0.82mm/px · z∈[+1215,+1575]mm · 13 of 82 slices shown, 15 images]
[im 5/82  soft-tissue]
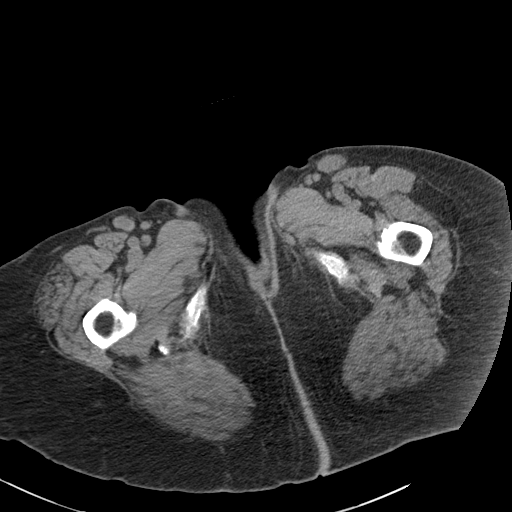
[im 5/82  bone]
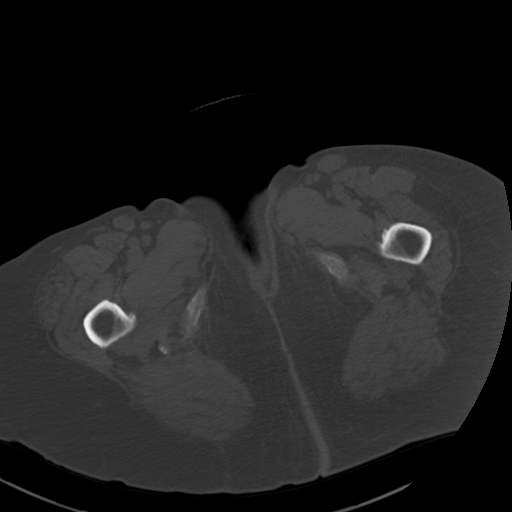
[im 10/82  soft-tissue]
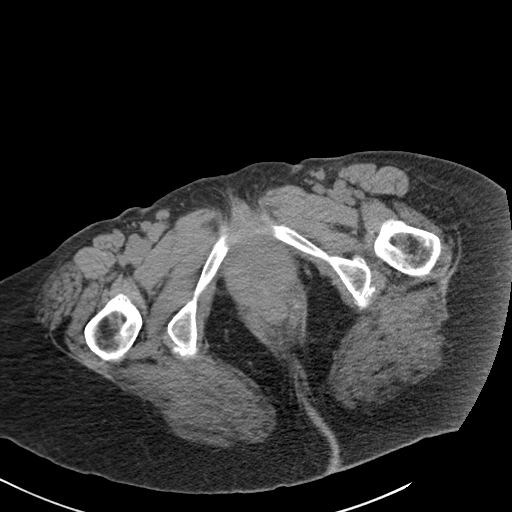
[im 19/82  soft-tissue]
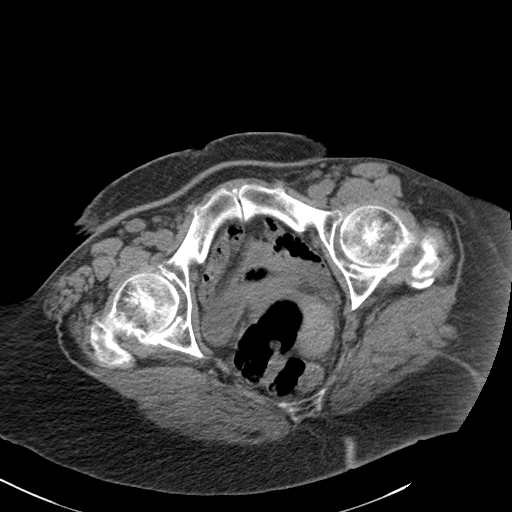
[im 23/82  soft-tissue]
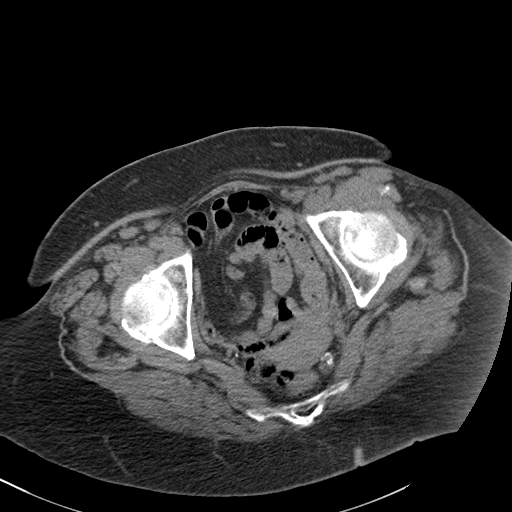
[im 28/82  soft-tissue]
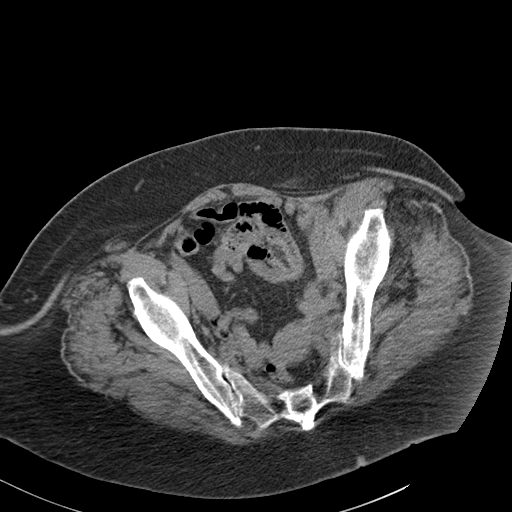
[im 37/82  soft-tissue]
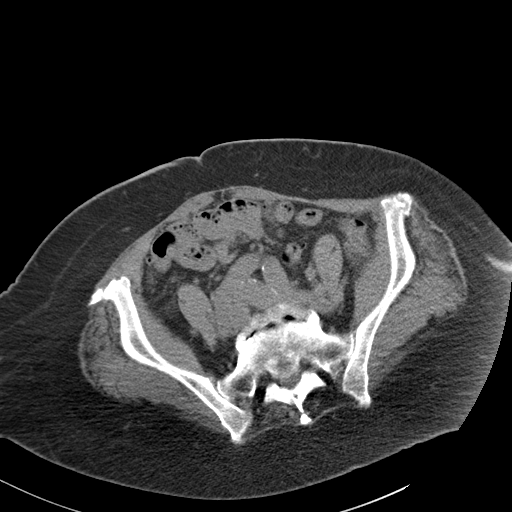
[im 41/82  soft-tissue]
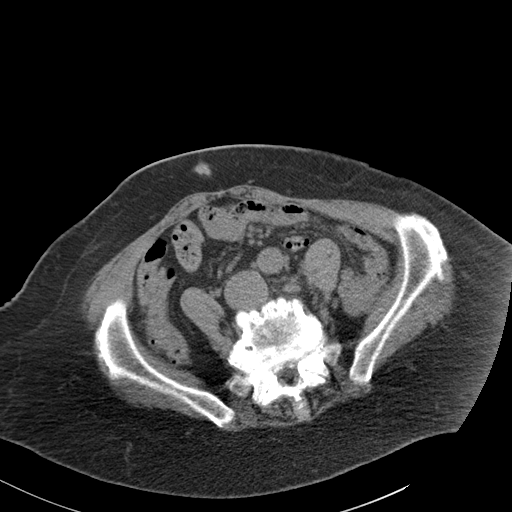
[im 46/82  soft-tissue]
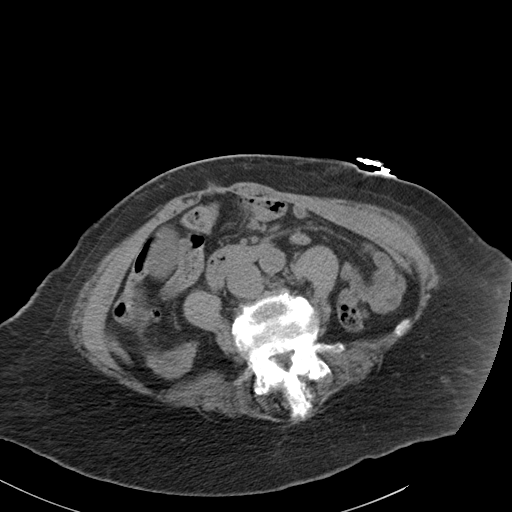
[im 55/82  soft-tissue]
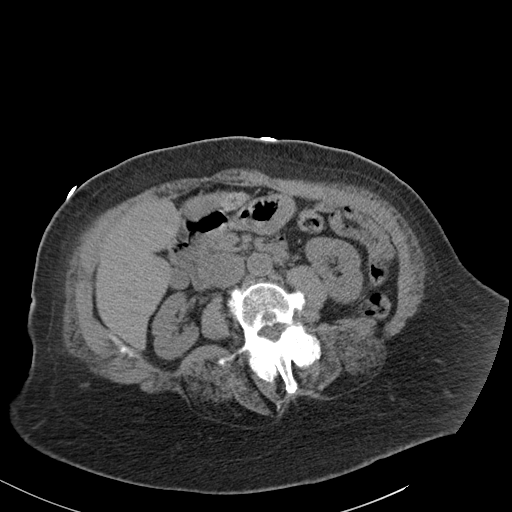
[im 55/82  bone]
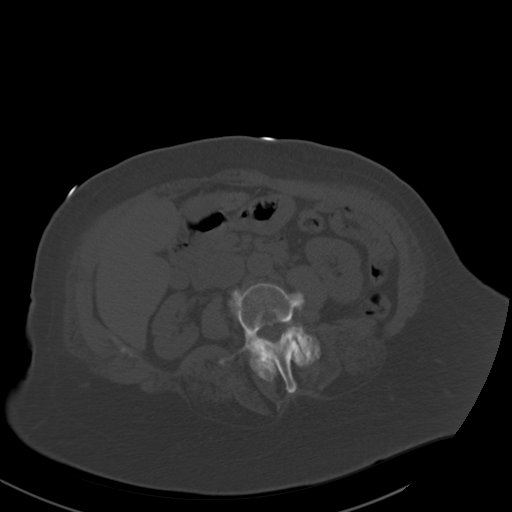
[im 59/82  soft-tissue]
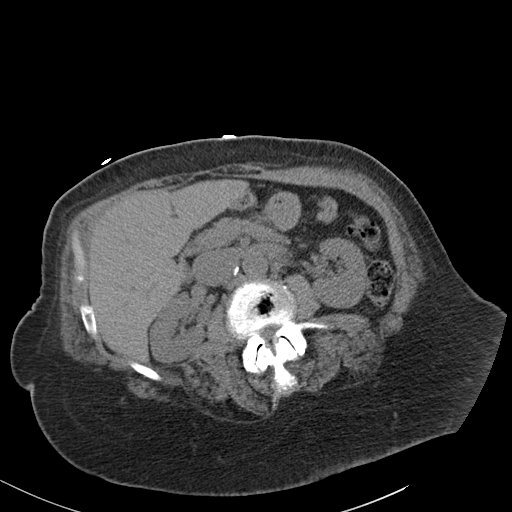
[im 64/82  soft-tissue]
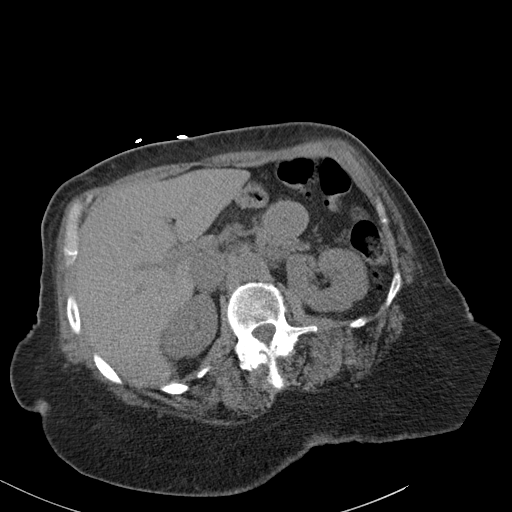
[im 73/82  soft-tissue]
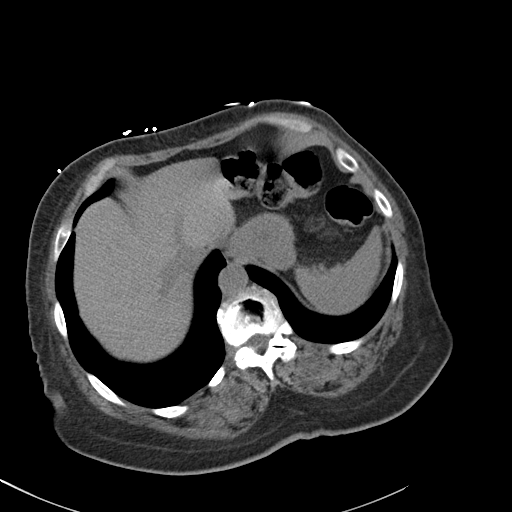
[im 77/82  soft-tissue]
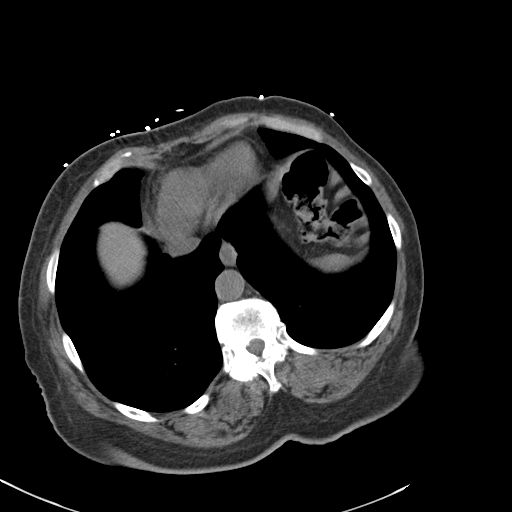

[Series 5: coronal st · coronal · 0.85mm/px · 3 of 165 slices shown]
[im 55/165  soft-tissue]
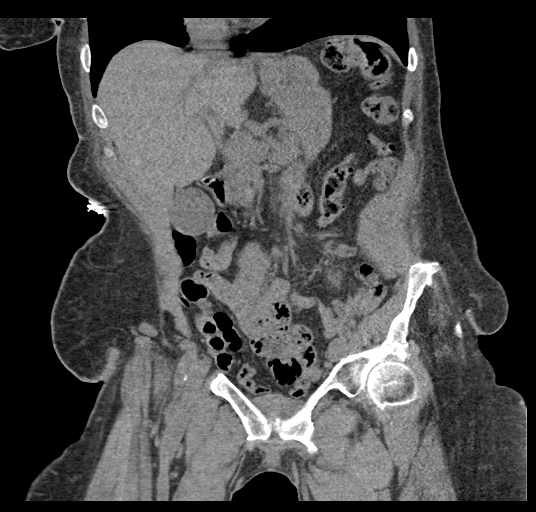
[im 73/165  soft-tissue]
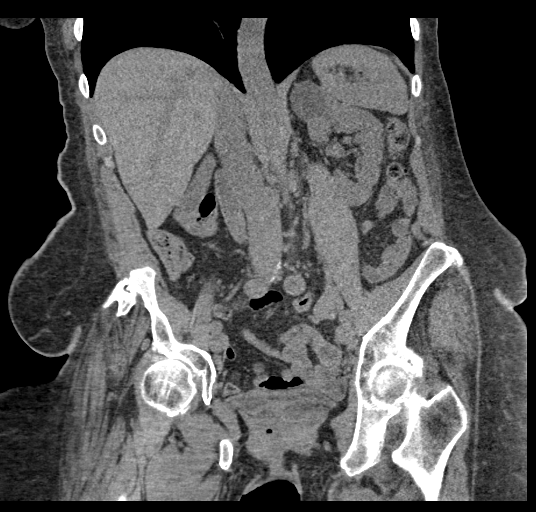
[im 92/165  soft-tissue]
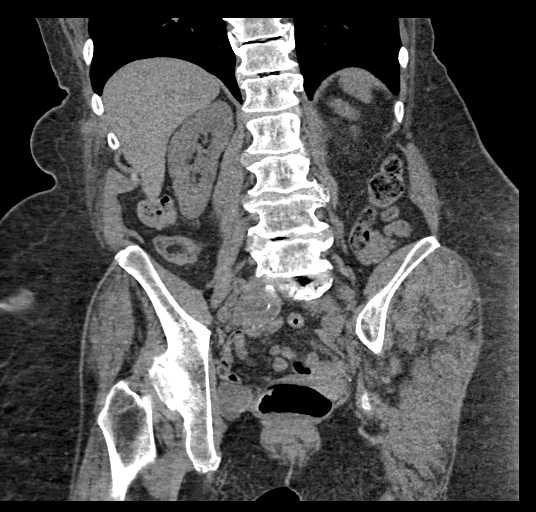

[16 of 46 positions shown; findings below may reference images not displayed]

FINDINGS: Lower chest: The visualized heart size within normal limits. No
pericardial fluid/thickening.

No hiatal hernia.

The visualized portions of the lungs are clear.

Hepatobiliary: Although limited due to the lack of intravenous
contrast, normal in appearance without gross focal abnormality. No
evidence of calcified gallstones or biliary ductal dilatation.

Pancreas:  Unremarkable.  No surrounding inflammatory changes.

Spleen: Normal in size. Although limited due to the lack of
intravenous contrast, normal in appearance.

Adrenals/Urinary Tract: Both adrenal glands appear normal. Again
noted is a low-density lesion in the upper pole the left kidney
measuring 2.5 cm. The right kidney is unremarkable. No renal,
collecting system, or bladder calculi are seen.

Stomach/Bowel: The stomach and small bowel are unremarkable.
Scattered colonic diverticula are noted without diverticulitis. The
appendix is unremarkable.

Vascular/Lymphatic: There is scattered bilateral inguinal lymph
nodes, as on the prior exam, the largest measuring 1.5 cm in the
right inguinal region. No definite intra-abdominal adenopathy seen.

Reproductive: The uterus and adnexa are unremarkable.

Other: No evidence of abdominal wall mass or hernia.

Musculoskeletal: No acute or significant osseous findings. There is
diffuse osteopenia and bilateral hip osteoarthritis. Degenerative
changes in the thoracolumbar spine.
IMPRESSION: No acute intra-abdominal or pelvic abnormality.

Colonic diverticulosis

Nonspecific bilateral inguinal adenopathy as on the prior exam

Probable left simple renal cyst.

## 2019-01-06 IMAGING — CR DG CHEST 2V
2 series · 2 of 2 positions shown · non-contrast
Comparison: [DATE] chest CT

CLINICAL DATA: Nausea vomiting.  Weakness and shortness of breath.

EXAM:
CHEST - 2 VIEW

[w chest lat]
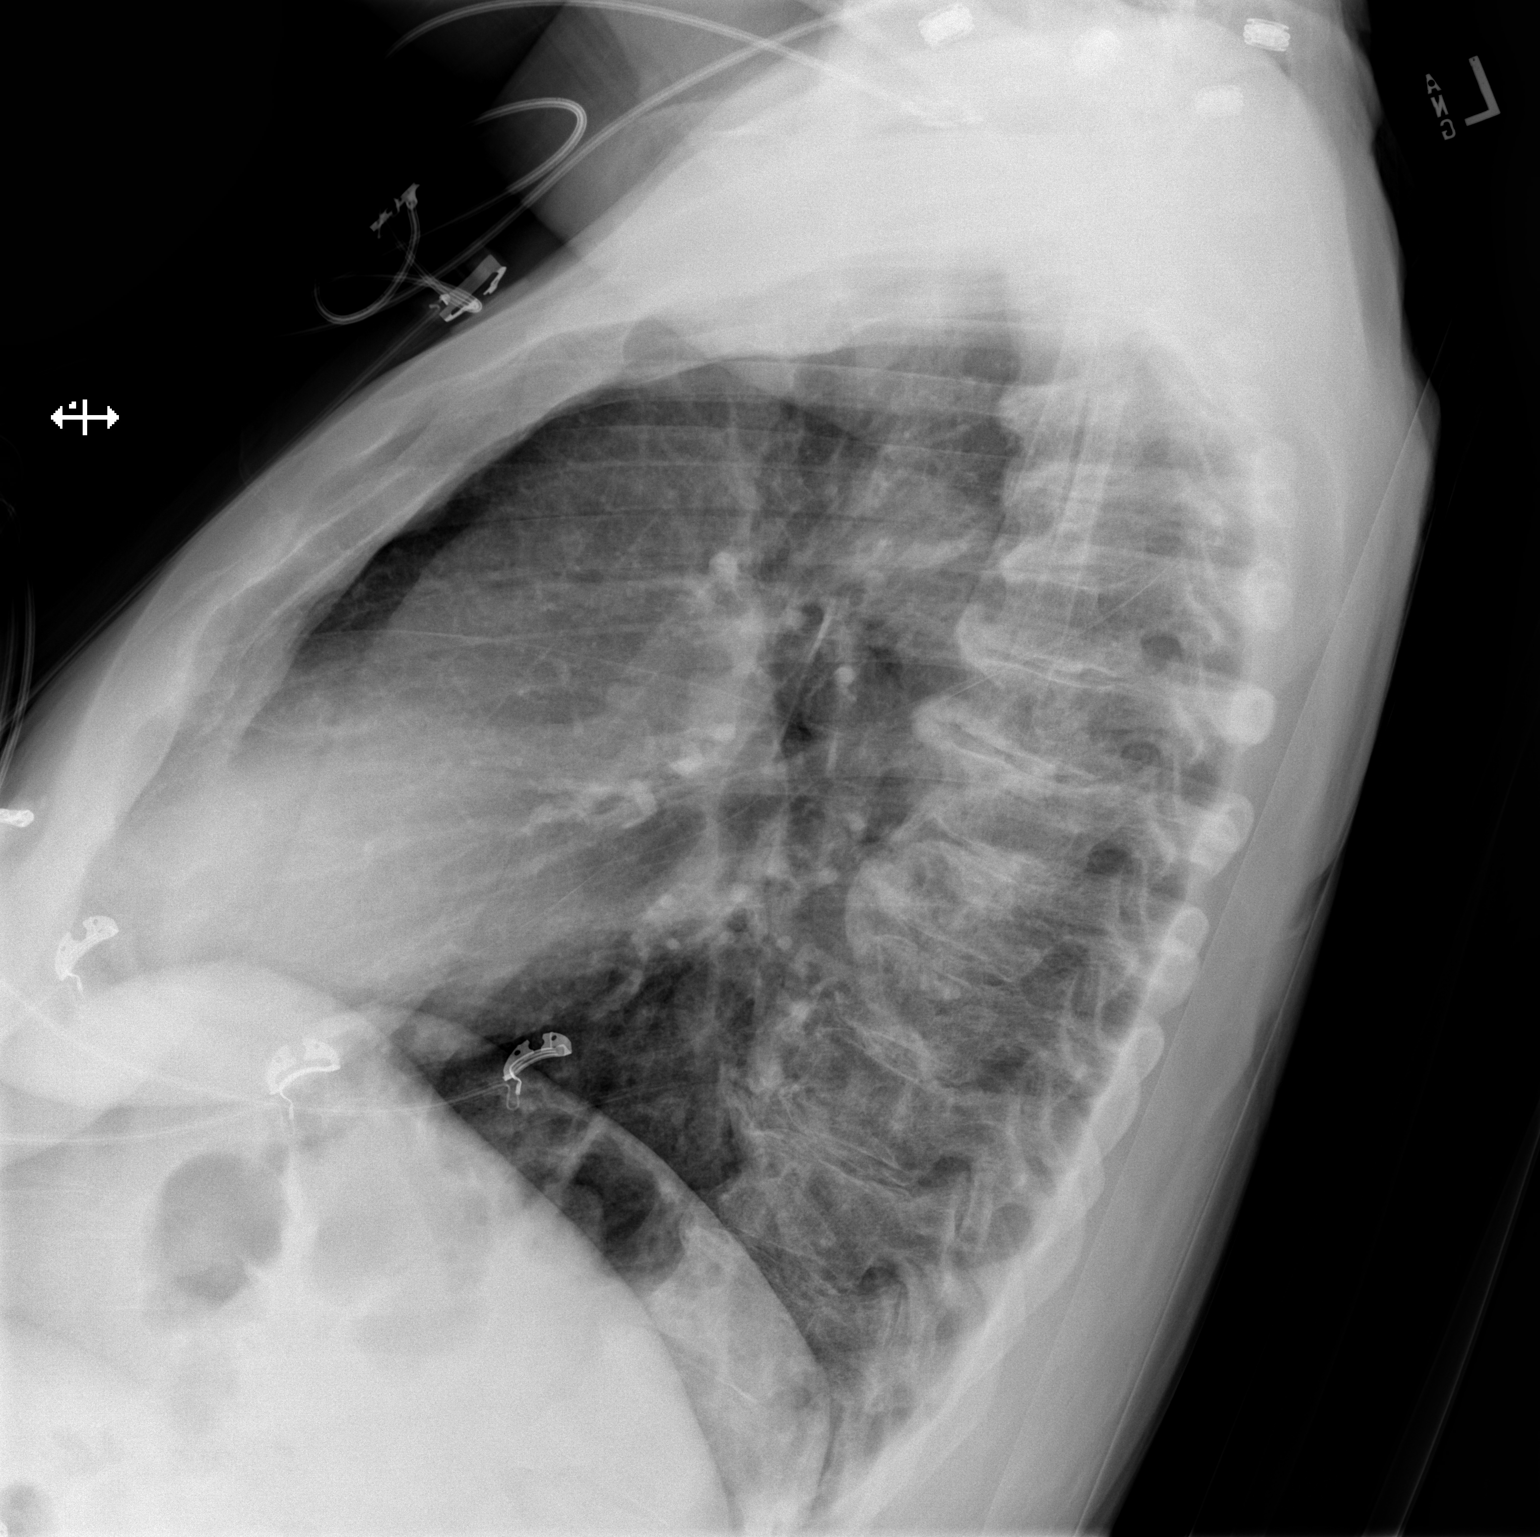

[x chest ap]
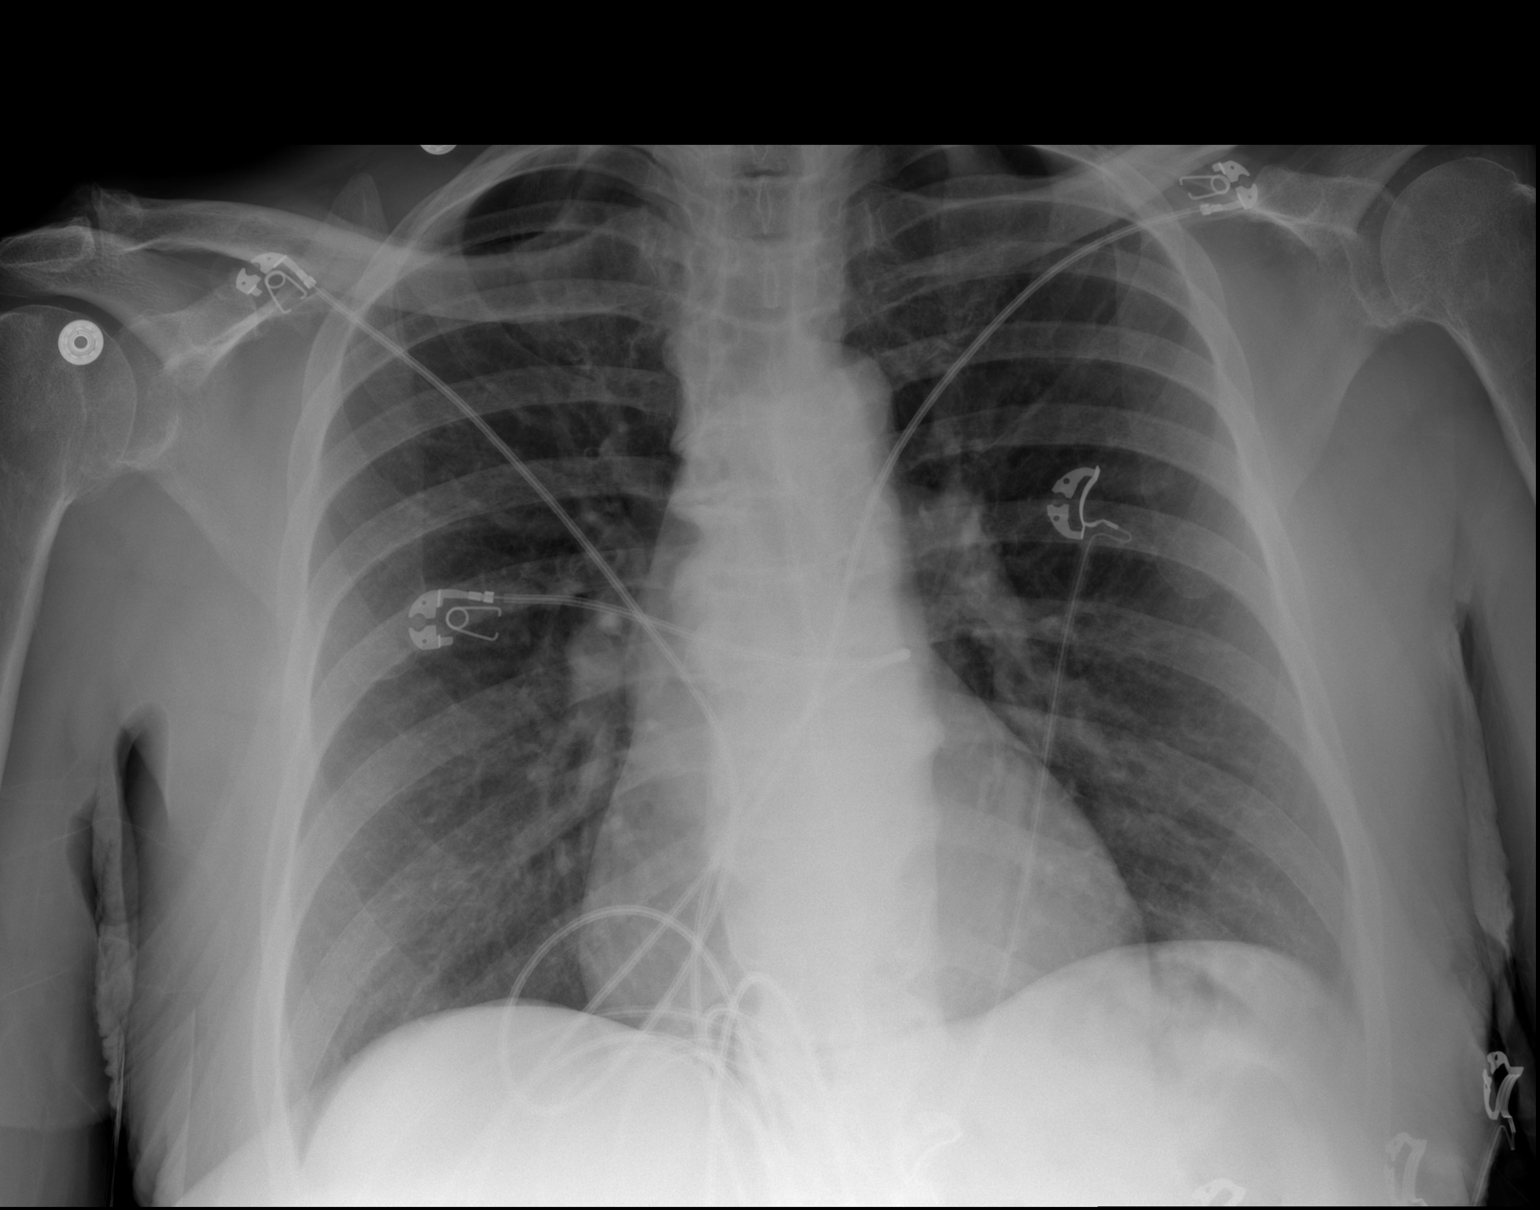

[2 of 2 positions shown; findings below may reference images not displayed]

FINDINGS: Mild thoracic spondylosis. Cardiac and mediastinal margins appear
normal. Lungs appear clear. No blunting of the costophrenic angles.
IMPRESSION: 1.  No active cardiopulmonary disease is radiographically apparent.
2. Thoracic spondylosis.

## 2019-01-06 IMAGING — US US RENAL
1 series · 14 of 25 positions shown · non-contrast
Comparison: CT same day

CLINICAL DATA: Acute renal failure

EXAM:
RENAL / URINARY TRACT ULTRASOUND COMPLETE

[Series 1: us renal · 14 of 41 slices shown]
[im 1/41]
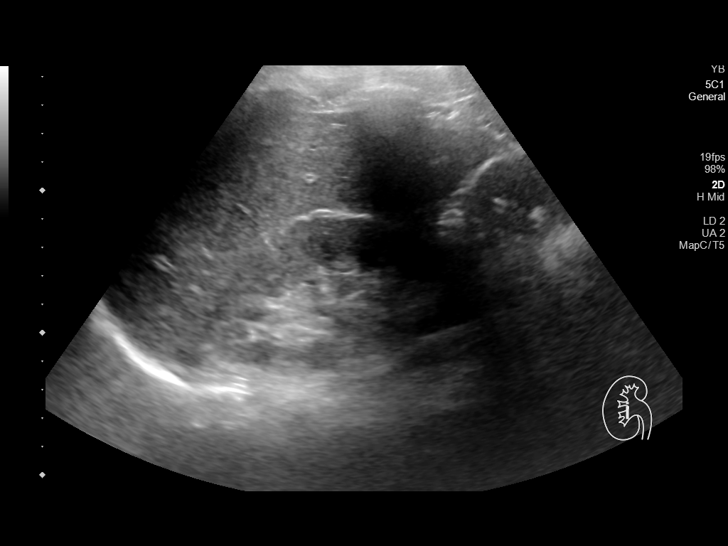
[im 4/41]
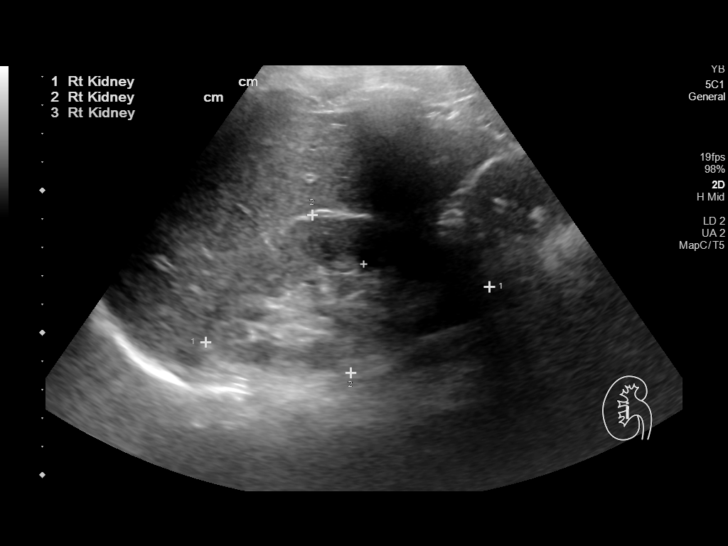
[im 7/41]
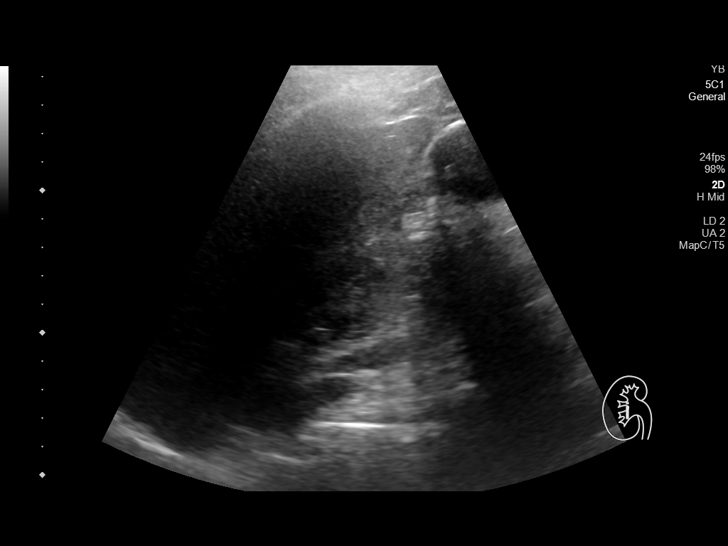
[im 11/41]
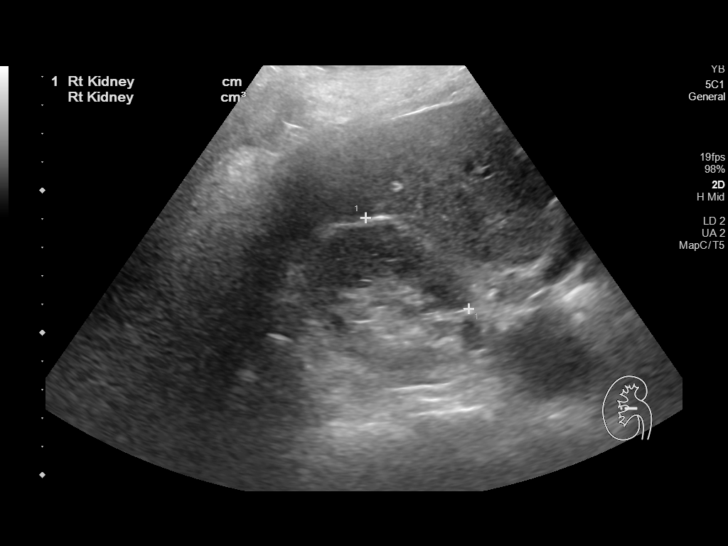
[im 14/41]
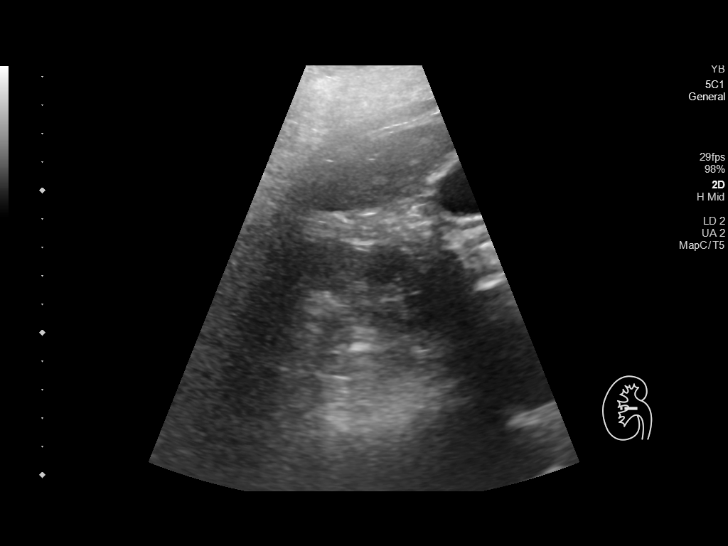
[im 16/41]
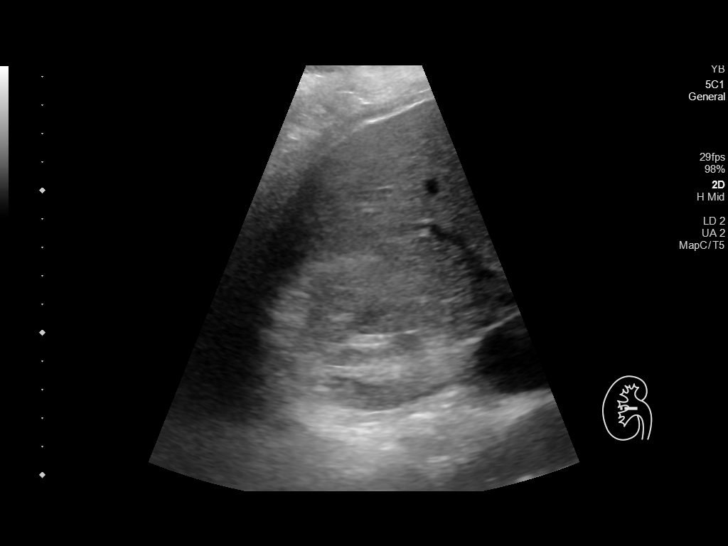
[im 19/41]
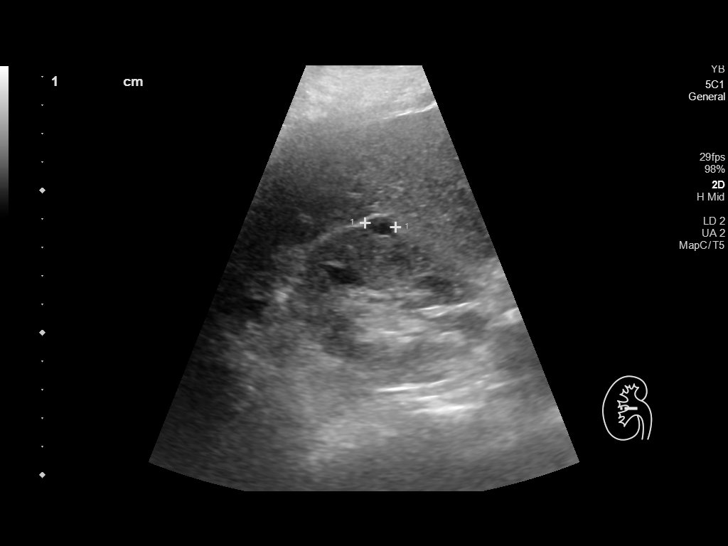
[im 22/41]
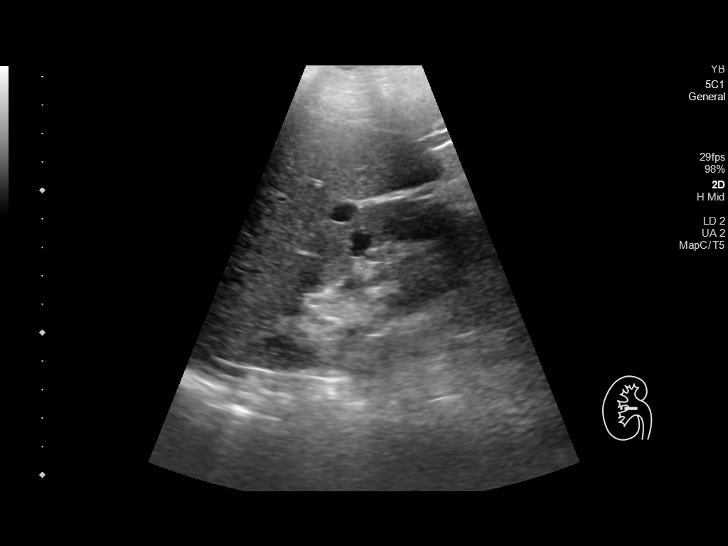
[im 26/41]
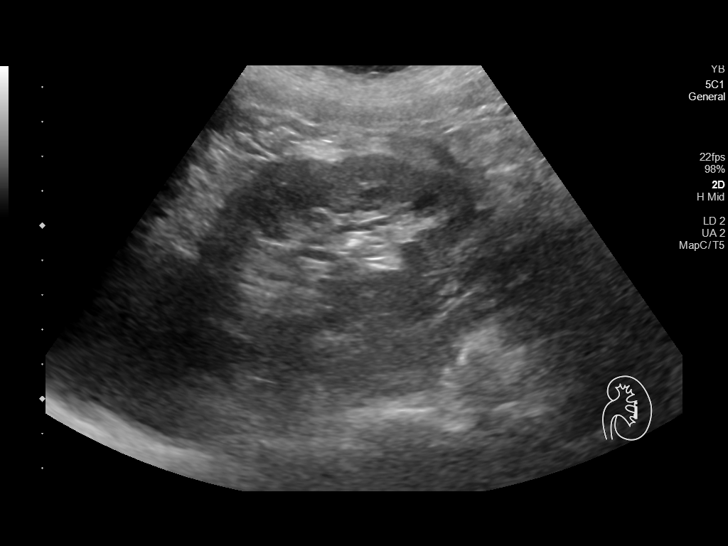
[im 27/41]
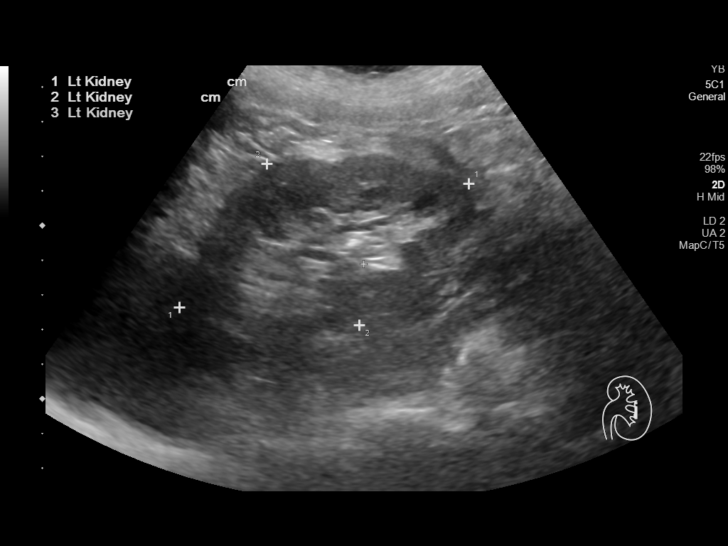
[im 31/41]
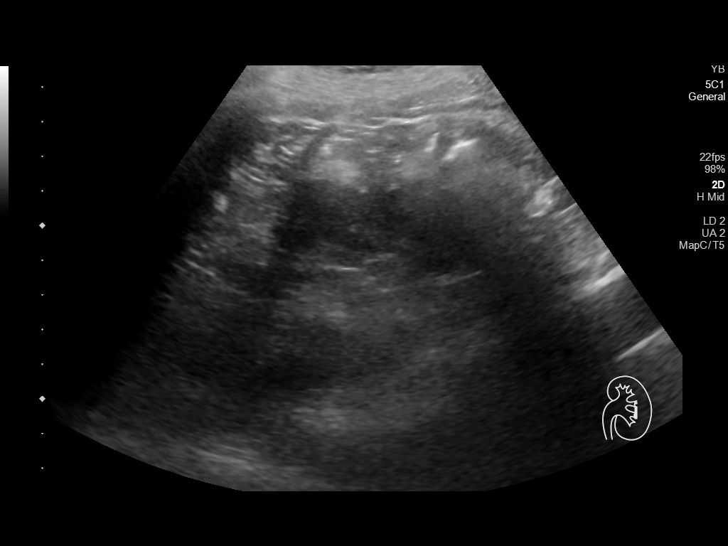
[im 34/41]
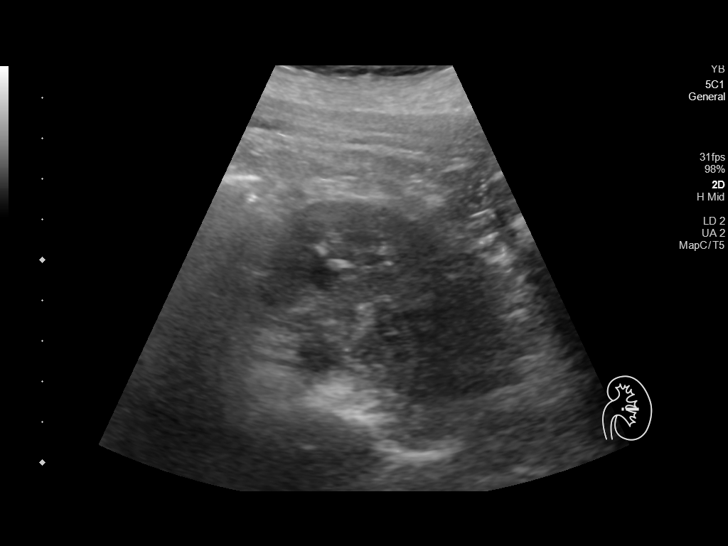
[im 37/41]
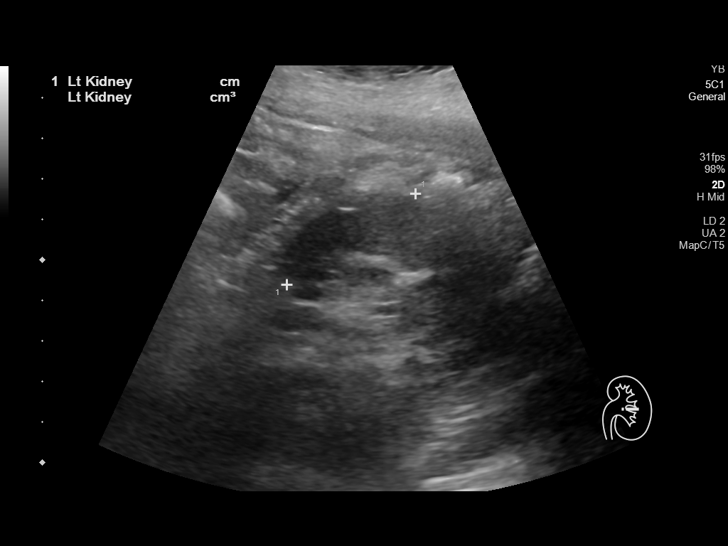
[im 41/41]
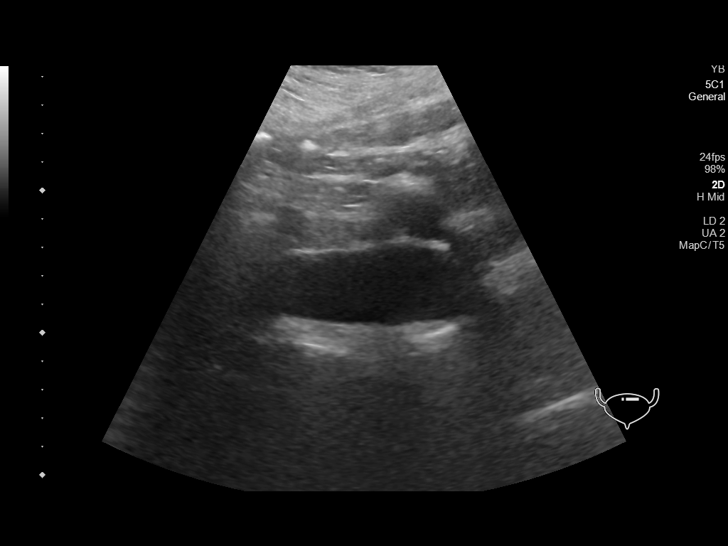

[14 of 25 positions shown; findings below may reference images not displayed]

FINDINGS: Right Kidney:

Renal measurements: 10.2 x 5.7 x 4.8 = volume: 148 mL. There is a
small anechoic simple cyst seen within the mid pole measuring 0.9 x
0.7 by 1.1 cm. No hydronephrosis or shadowing stone.

Left Kidney:

Renal measurements: 9.1 x 5.4 x 3.9 = volume: 99 mL. Echogenicity
within normal limits. No mass or hydronephrosis visualized.

Bladder:

Appears normal for degree of bladder distention.
IMPRESSION: No hydronephrosis or shadowing stones.

## 2019-01-06 MED ORDER — PROMETHAZINE HCL 25 MG/ML IJ SOLN
12.5000 mg | Freq: Once | INTRAMUSCULAR | Status: AC
  Administered 2019-01-06: 12.5 mg via INTRAVENOUS
  Filled 2019-01-06: qty 1

## 2019-01-06 MED ORDER — ONDANSETRON HCL 4 MG/2ML IJ SOLN
4.0000 mg | Freq: Four times a day (QID) | INTRAMUSCULAR | Status: DC | PRN
  Administered 2019-01-07: 4 mg via INTRAVENOUS
  Filled 2019-01-06: qty 2

## 2019-01-06 MED ORDER — ONDANSETRON HCL 4 MG/2ML IJ SOLN
8.0000 mg | Freq: Once | INTRAMUSCULAR | Status: AC
  Administered 2019-01-06: 8 mg via INTRAMUSCULAR

## 2019-01-06 MED ORDER — METOPROLOL TARTRATE 50 MG PO TABS
50.0000 mg | ORAL_TABLET | Freq: Two times a day (BID) | ORAL | Status: DC
  Administered 2019-01-07 – 2019-01-09 (×3): 50 mg via ORAL
  Filled 2019-01-06 (×6): qty 1

## 2019-01-06 MED ORDER — ONDANSETRON HCL 4 MG/2ML IJ SOLN
4.0000 mg | Freq: Once | INTRAMUSCULAR | Status: AC
  Administered 2019-01-06: 4 mg via INTRAVENOUS
  Filled 2019-01-06: qty 2

## 2019-01-06 MED ORDER — SODIUM CHLORIDE 0.9 % IV BOLUS
1000.0000 mL | Freq: Once | INTRAVENOUS | Status: AC
  Administered 2019-01-06: 1000 mL via INTRAVENOUS

## 2019-01-06 MED ORDER — ONDANSETRON HCL 4 MG/2ML IJ SOLN: 4.0000 mg | Freq: Once | INTRAMUSCULAR | Status: DC

## 2019-01-06 MED ORDER — SODIUM ZIRCONIUM CYCLOSILICATE 10 G PO PACK
10.0000 g | PACK | Freq: Once | ORAL | Status: AC
  Administered 2019-01-06: 10 g via ORAL
  Filled 2019-01-06: qty 1

## 2019-01-06 MED ORDER — SODIUM CHLORIDE 0.9% FLUSH
3.0000 mL | Freq: Two times a day (BID) | INTRAVENOUS | Status: DC
  Administered 2019-01-07 – 2019-01-08 (×2): 3 mL via INTRAVENOUS

## 2019-01-06 MED ORDER — SODIUM CHLORIDE 0.9 % IV SOLN
INTRAVENOUS | Status: DC
  Administered 2019-01-06: 23:00:00 via INTRAVENOUS

## 2019-01-06 MED ORDER — ACETAMINOPHEN 650 MG RE SUPP: 650.0000 mg | Freq: Four times a day (QID) | RECTAL | Status: DC | PRN

## 2019-01-06 MED ORDER — INSULIN ASPART 100 UNIT/ML ~~LOC~~ SOLN
0.0000 [IU] | Freq: Three times a day (TID) | SUBCUTANEOUS | Status: DC
  Filled 2019-01-06: qty 0.09

## 2019-01-06 MED ORDER — PANTOPRAZOLE SODIUM 40 MG PO TBEC: 40.0000 mg | DELAYED_RELEASE_TABLET | Freq: Every day | ORAL | 3 refills | Status: DC

## 2019-01-06 MED ORDER — HEPARIN SODIUM (PORCINE) 5000 UNIT/ML IJ SOLN
5000.0000 [IU] | Freq: Three times a day (TID) | INTRAMUSCULAR | Status: DC
  Administered 2019-01-07 – 2019-01-09 (×8): 5000 [IU] via SUBCUTANEOUS
  Filled 2019-01-06 (×8): qty 1

## 2019-01-06 MED ORDER — ONDANSETRON HCL 4 MG PO TABS: 4.0000 mg | ORAL_TABLET | Freq: Three times a day (TID) | ORAL | 1 refills | Status: DC | PRN

## 2019-01-06 MED ORDER — PROMETHAZINE HCL 25 MG PO TABS: 25.0000 mg | ORAL_TABLET | Freq: Three times a day (TID) | ORAL | 0 refills | Status: DC | PRN

## 2019-01-06 MED ORDER — ONDANSETRON HCL 4 MG PO TABS: 4.0000 mg | ORAL_TABLET | Freq: Four times a day (QID) | ORAL | Status: DC | PRN

## 2019-01-06 MED ORDER — ACETAMINOPHEN 325 MG PO TABS: 650.0000 mg | ORAL_TABLET | Freq: Four times a day (QID) | ORAL | Status: DC | PRN

## 2019-01-06 NOTE — Telephone Encounter (Signed)
CRITICAL VALUE STICKER  CRITICAL VALUE: GFR: 11.8  RECEIVER (on-site recipient of call):[name] [name]  DATE & TIME NOTIFIED: 01/06/2019, 11:11  MESSENGER (representative from lab): SAA  MD NOTIFIED: yes  TIME OF NOTIFICATION: 11:11  RESPONSE:

## 2019-01-06 NOTE — Telephone Encounter (Signed)
Called and left another message to call back ASAP.

## 2019-01-06 NOTE — ED Triage Notes (Addendum)
Patient reports she was sent by PCP after lab draw today showed "dehydration." Reports nausea and weakness x2 weeks. Denies V/D. Denies fevers, cough, SOB.  Patient adds she is taking oral abx for lymphedema.

## 2019-01-06 NOTE — Telephone Encounter (Signed)
Called LVM for patient to call back for results ASAP

## 2019-01-06 NOTE — Assessment & Plan Note (Signed)
She is holding lasix which is appropriate. Taking losartan but missed a couple doses this week and BP is still 90/50. Asked her to stop losartan for 1-2 weeks or longer depending on oral intake. Checking CMP, lipase, CBC today.

## 2019-01-06 NOTE — Progress Notes (Signed)
   Subjective:   Patient ID: [name] [name], female    DOB: March 09, 1946, 73 y.o.   MRN: [id number]  HPI The patient is a 73 YO female coming in for several concerns including nausea without vomiting (severe and started in the last couple of weeks, barely eating anything and barely able to drink anything, feeling weak with some SOB on exertion and lightheadedness, feels like she did when she was getting dehydrated before, is taking zofran but this is not helping well for her nausea, had a similar episode about 1 year ago which did not have a clear cause) and edema in legs (chronic, wounds are chronic and are weeping more lately and smelling more, started on doxycycline about 5 days ago and feels that this is helping some, denies fevers or chills).   PMH, [facility], social history reviewed and updated  Review of Systems  Constitutional: Positive for activity change, appetite change and fatigue.  HENT: Negative.   Eyes: Negative.   Respiratory: Negative for cough, chest tightness and shortness of breath.   Cardiovascular: Positive for leg swelling. Negative for chest pain and palpitations.  Gastrointestinal: Positive for nausea and vomiting. Negative for abdominal distention, abdominal pain, anal bleeding, blood in stool, constipation, diarrhea and rectal pain.  Musculoskeletal: Negative.   Skin: Negative.   Neurological: Positive for weakness and light-headedness.  Psychiatric/Behavioral: Negative.     Objective:  Physical Exam Constitutional:      Appearance: She is well-developed. She is ill-appearing.  HENT:     Head: Normocephalic and atraumatic.  Neck:     Musculoskeletal: Normal range of motion.  Cardiovascular:     Rate and Rhythm: Normal rate and regular rhythm.  Pulmonary:     Effort: Pulmonary effort is normal. No respiratory distress.     Breath sounds: Normal breath sounds. No wheezing or rales.  Abdominal:     General: Bowel sounds are normal. There is no distension.   Palpations: Abdomen is soft.     Tenderness: There is no abdominal tenderness. There is no rebound.  Musculoskeletal:     Right lower leg: Edema present.     Left lower leg: Edema present.     Comments: Legs wrapped and she defers exam today  Skin:    General: Skin is warm and dry.  Neurological:     Mental Status: She is alert and oriented to person, place, and time.     Coordination: Coordination abnormal.     Comments: Slow gait, walker     Vitals:   01/06/19 0837  BP: (!) 90/50  Pulse: 80  Temp: 97.6 F (36.4 C)  TempSrc: Oral  SpO2: 99%  Weight: 244 lb (110.7 kg)  Height: 5\' 11"  (1.803 m)   Visit time 40 minutes: greater than 50% of that time was spent in face to face counseling and coordination of care with the patient: counseled about episode nausea and vomiting, development of plan and counseling about labs and need to push fluids to avoid dehydration and counseling about BP medications and need to stop losartan (continue holding lasix) for next 1-2 weeks).   Assessment & Plan:  8 mg Zofran given IM at visit

## 2019-01-06 NOTE — Patient Instructions (Signed)
We have sent in refill of ondansetron for nausea. We have also sent in a different nausea medicine called phenergan (promethazine) to try instead for the nausea.   We have sent in protonix (pantoprazole) to take 1 pill daily to see if this helps the nausea for a couple of weeks.   Call us next week to let us know how you are doing or sooner if not well.   Do not take furosemide or losartan for the next 1-2 weeks at least or until eating better.

## 2019-01-06 NOTE — Telephone Encounter (Signed)
Please see result note but patient needs to go to ER for fluids due to severe kidney injury.

## 2019-01-06 NOTE — Assessment & Plan Note (Signed)
Given zofran 8 mg IM today to see if this can help. Rx for phenergan and zofran to help. Refill protonix as this seemed to help last time. Checking labs for AKI and if present she may need ER for IV fluids.

## 2019-01-06 NOTE — H&P (Signed)
History and Physical    [name] [name] [id number] DOB: 1945-08-16 DOA: 01/06/2019  PCP: Hoyt Koch, MD  Patient coming from: Home  I have personally briefly reviewed patient's old medical records in [place]  Chief Complaint: Nausea, abnormal labs  HPI: [name] [name] is a 73 y.o. female with medical history significant for type 2 diabetes, hypertension, paroxysmal SVT, GERD, and chronic lymphedema of the lower extremities who presents the ED for evaluation of persistent nausea and abnormal labs performed as an outpatient revealing acute renal failure and hyperkalemia.  Patient states she was in her usual state of health until about 2 weeks ago when she began to have frequent nausea without emesis.  This is worsened over the last week and she has been having poor appetite.  She has also noted decreased urinary frequency without dysuria.  She was seen by her home nurse and was told she had infection in both of her legs and was started on doxycycline about 5 days ago.  She was seen by her PCP today who obtained labs which revealed BUN 67, creatinine 4.44, and potassium 5.7.  She was called to present to the ED for further evaluation.  ED Course:  Initial vitals showed BP 110/75, pulse 107, RR 20, temp 98.9 Fahrenheit, SPO2 98% on room air.  Labs are notable for BUN 73, creatinine 4.73, potassium 6.0, bicarb 15, serum glucose 102, WBC 6.5, hemoglobin 11.7, MCV 77.5, platelets 355,000, lactic acid 2.0.  SARS-CoV-2 test was ordered and pending.  2 view chest x-ray was negative for acute cardiopulmonary process.  CT abdomen/pelvis without contrast was negative for acute intra-abdominal or pelvic abnormality.  Colonic diverticulosis was noted as well as nonspecific bilateral inguinal adenopathy and probable left simple renal cyst.  Patient was given 1 L normal saline and Phenergan and Zofran for nausea.  The hospitalist service was consulted admit for further evaluation  and management.  Review of Systems: All systems reviewed and are negative except as documented in history of present illness above.   Past Medical History:  Diagnosis Date   Acute kidney injury ([place])    Aortic stenosis, mild 11/17/2013   Arthritis    "both knees, spine, back, fingers" (11/29/2013)   CHF (congestive heart failure) (HCC)    Edema    GERD (gastroesophageal reflux disease)    Heart murmur    History of diastolic dysfunction    Echo [id number] with diastolic dysfunction   HTN (hypertension)    Morbid obesity (HCC)    OA (osteoarthritis)    PAC (premature atrial contraction)    per prior Holter   Palpitations    Pneumonia    "as a child"   PVC's (premature ventricular contractions)    per prior Holter   SVT (supraventricular tachycardia) (HCC)    Tachycardia    noted at 07/28/11 visit. Started on beta blocker. Possible atrial flutter vs long PR tachycardia/AVNRT   Type II diabetes mellitus [facility])     Past Surgical History:  Procedure Laterality Date   [place] TACHYCARDIA ABLATION  11/29/2013   SUPRAVENTRICULAR TACHYCARDIA ABLATION N/A 11/29/2013   Procedure: SUPRAVENTRICULAR TACHYCARDIA ABLATION;  Surgeon: Evans Lance, MD;  Location: [facility] CATH LAB;  Service: Cardiovascular;  Laterality: N/A;   US ECHOCARDIOGRAPHY  07/25/2008   EF 55-60%   VAGINAL DELIVERY      Social History:  reports that she quit smoking about 33 years ago. Her smoking use  included cigarettes. She has a 30.00 pack-year smoking history. She has never used smokeless tobacco. She reports that she does not drink alcohol or use drugs.  Allergies  Allergen Reactions   Oxycodone Hcl Nausea And Vomiting   Penicillins Itching    Has patient had a PCN reaction causing immediate rash, facial/tongue/throat swelling, SOB or lightheadedness with hypotension: No Has patient had a PCN reaction causing severe rash involving  mucus membranes or skin necrosis: No Has patient had a PCN reaction that required hospitalization: No Has patient had a PCN reaction occurring within the last 10 years: No If all of the above answers are "NO", then may proceed with Cephalosporin use.    Family History  Problem Relation Age of Onset   Alzheimer's disease Mother    Lung cancer Mother    COPD Father    Diabetes Maternal Uncle    Stroke Neg Hx    Colon cancer Neg Hx    Stomach cancer Neg Hx    Esophageal cancer Neg Hx      Prior to Admission medications   Medication Sig Start Date End Date Taking? Authorizing Provider  acetaminophen (TYLENOL) 325 MG tablet Take 2 tablets (650 mg total) by mouth every 6 (six) hours as needed for mild pain (or Fever >/= 101). 11/22/13  Yes Delfina Redwood, MD  aspirin EC 81 MG tablet Take 81 mg by mouth daily with breakfast. Takes once or twice weekly   Yes [provider]  doxycycline (VIBRA-TABS) 100 MG tablet Take 100 mg by mouth 2 (two) times daily. 12/30/18  Yes [provider]  furosemide (LASIX) 40 MG tablet TAKE 1 TABLET BY MOUTH EVERY DAY Patient taking differently: Take 40 mg by mouth daily.  01/02/19  Yes Hoyt Koch, MD  losartan (COZAAR) 100 MG tablet TAKE 1 TABLET BY MOUTH EVERY DAY Patient taking differently: Take 100 mg by mouth daily.  10/20/18  Yes Hoyt Koch, MD  metFORMIN (GLUCOPHAGE) 500 MG tablet Take 1 tablet (500 mg total) by mouth daily with breakfast. NEED OFFICE VISIT FOR FURTHER REFILLS 12/08/18  Yes Hoyt Koch, MD  methocarbamol (ROBAXIN) 500 MG tablet Take 1 tablet (500 mg total) by mouth every 6 (six) hours as needed for muscle spasms. 04/12/17  Yes Hoyt Koch, MD  metoprolol tartrate (LOPRESSOR) 50 MG tablet TAKE 1 TABLET BY MOUTH TWICE A DAY Patient taking differently: Take 50 mg by mouth 2 (two) times daily.  10/13/18  Yes Hoyt Koch, MD  Neomycin-Bacitracin-Polymyxin ([facility] ANTIBIOTIC)  [phone] [place] [facility] Patient taking differently: Apply 1 application topically daily. Apply after cleaning before wrapping lgs 11/23/16  Yes Hoyt Koch, MD  ondansetron (ZOFRAN) 4 MG tablet Take 1 tablet (4 mg total) by mouth every 8 (eight) hours as needed for nausea or vomiting. 01/06/19  Yes Hoyt Koch, MD  pantoprazole (PROTONIX) 40 MG tablet Take 1 tablet (40 mg total) by mouth daily. 01/06/19  Yes Hoyt Koch, MD  polyethylene glycol [facility] / GLYCOLAX) packet Take 17 g by mouth daily. 11/09/17  Yes Charlynne Cousins, MD  losartan (COZAAR) 25 MG tablet Take 4 tablets (100 mg total) by mouth daily. Patient not taking: Reported on 01/06/2019 10/20/18   Hoyt Koch, MD  montelukast (SINGULAIR) 10 MG tablet TAKE 1 TABLET BY MOUTH EVERYDAY AT BEDTIME Patient not taking: Reported on 01/06/2019 10/20/18   Hoyt Koch, MD  ondansetron (ZOFRAN-ODT) 8 MG disintegrating tablet  Take 1 tablet (8 mg total) by mouth every 8 (eight) hours as needed for nausea or vomiting. Patient not taking: Reported on 01/06/2019 12/17/17   Levin Erp, PA  promethazine (PHENERGAN) 25 MG tablet Take 1 tablet (25 mg total) by mouth every 8 (eight) hours as needed for nausea or vomiting. Patient not taking: Reported on 01/06/2019 01/06/19   Hoyt Koch, MD    Physical Exam: Vitals:   01/06/19 1710 01/06/19 2135  BP: 110/75 (!) 110/50  Pulse: (!) 107 94  Resp: 20 17  Temp: 98.9 F (37.2 C)   TempSrc: Oral   SpO2: 98% 100%    Constitutional: Elderly woman resting supine in bed, NAD, calm, comfortable Eyes: PERRL, lids and conjunctivae normal ENMT: Mucous membranes are moist. Posterior pharynx clear of any exudate or lesions.Normal dentition.  Neck: normal, supple, no masses. Respiratory: clear to auscultation bilaterally, no wheezing, no crackles. Normal respiratory effort. No accessory muscle use.    Cardiovascular: Regular rate and rhythm, no murmurs / rubs / gallops.  Lymphedema both lower extremities with wrapping in place. Abdomen: no tenderness, no masses palpated. No hepatosplenomegaly. Bowel sounds positive.  Musculoskeletal: no clubbing / cyanosis. No joint deformity upper and lower extremities. Good ROM, no contractures. Normal muscle tone.  Skin: no rashes, lesions, ulcers. No induration Neurologic: CN 2-12 grossly intact. Sensation intact, Strength 5/5 in all 4.  Psychiatric: Normal judgment and insight. Alert and oriented x 3. Normal mood.     Labs on Admission: I have personally reviewed following labs and imaging studies  CBC: Recent Labs  Lab 01/06/19 0919 01/06/19 1832  WBC 9.2 6.5  HGB 10.6* 11.7*  HCT 32.8* 37.6  MCV 75.4* 77.5*  PLT 469.0* [id number]   Basic Metabolic Panel: Recent Labs  Lab 01/06/19 0919 01/06/19 2023  NA 136 135  K 5.7* 6.0*  CL 105 110  CO2 16* 15*  GLUCOSE 110* 102*  BUN 67* 73*  CREATININE 4.44* 4.73*  CALCIUM 9.7 8.6*   GFR: Estimated Creatinine Clearance: 14.7 mL/min (A) (by C-G formula based on SCr of 4.73 mg/dL (H)). Liver Function Tests: Recent Labs  Lab 01/06/19 0919  AST 17  ALT 11  ALKPHOS 71  BILITOT 0.4  PROT 8.4*  ALBUMIN 3.6   Recent Labs  Lab 01/06/19 0919  LIPASE 27.0   No results for input(s): AMMONIA in the last 168 hours. Coagulation Profile: No results for input(s): INR, PROTIME in the last 168 hours. Cardiac Enzymes: No results for input(s): CKTOTAL, CKMB, CKMBINDEX, TROPONINI in the last 168 hours. BNP (last 3 results) No results for input(s): PROBNP in the last 8760 hours. HbA1C: No results for input(s): HGBA1C in the last 72 hours. CBG: No results for input(s): GLUCAP in the last 168 hours. Lipid Profile: No results for input(s): CHOL, HDL, LDLCALC, TRIG, CHOLHDL, LDLDIRECT in the last 72 hours. Thyroid Function Tests: No results for input(s): TSH, T4TOTAL, FREET4, T3FREE, THYROIDAB in  the last 72 hours. Anemia Panel: No results for input(s): VITAMINB12, FOLATE, FERRITIN, TIBC, IRON, RETICCTPCT in the last 72 hours. Urine analysis:    Component Value Date/Time   COLORURINE YELLOW 11/06/2017 2030   APPEARANCEUR CLOUDY (A) 11/06/2017 2030   LABSPEC 1.019 11/06/2017 2030   [place] 5.0 11/06/2017 2030   GLUCOSEU NEGATIVE 11/06/2017 2030   [place] NEGATIVE 11/06/2017 2030   [place] NEGATIVE 11/06/2017 2030   BILIRUBINUR Neg 10/22/2017 0950   KETONESUR NEGATIVE 11/06/2017 2030   PROTEINUR NEGATIVE 11/06/2017 2030   UROBILINOGEN 0.2  10/22/2017 0950   UROBILINOGEN 1.0 12/09/2013 2303   NITRITE NEGATIVE 11/06/2017 2030   [place] 11/06/2017 2030    Radiological Exams on Admission: Ct Abdomen Pelvis Wo Contrast  Result Date: 01/06/2019 CLINICAL DATA:  Nausea, vomiting EXAM: CT ABDOMEN AND PELVIS WITHOUT CONTRAST TECHNIQUE: Multidetector CT imaging of the abdomen and pelvis was performed following the standard protocol without IV contrast. COMPARISON:  October 16, 2017 FINDINGS: Lower chest: The visualized heart size within normal limits. No pericardial fluid/thickening. No hiatal hernia. The visualized portions of the lungs are clear. Hepatobiliary: Although limited due to the lack of intravenous contrast, normal in appearance without gross focal abnormality. No evidence of calcified gallstones or biliary ductal dilatation. Pancreas:  Unremarkable.  No surrounding inflammatory changes. Spleen: Normal in size. Although limited due to the lack of intravenous contrast, normal in appearance. Adrenals/Urinary Tract: Both adrenal glands appear normal. Again noted is a low-density lesion in the upper pole the left kidney measuring 2.5 cm. The right kidney is unremarkable. No renal, collecting system, or bladder calculi are seen. Stomach/Bowel: The stomach and small bowel are unremarkable. Scattered colonic diverticula are noted without diverticulitis. The appendix is  unremarkable. Vascular/Lymphatic: There is scattered bilateral inguinal lymph nodes, as on the prior exam, the largest measuring 1.5 cm in the right inguinal region. No definite intra-abdominal adenopathy seen. Reproductive: The uterus and adnexa are unremarkable. Other: No evidence of abdominal wall mass or hernia. Musculoskeletal: No acute or significant osseous findings. There is diffuse osteopenia and bilateral hip osteoarthritis. Degenerative changes in the thoracolumbar spine. IMPRESSION: No acute intra-abdominal or pelvic abnormality. Colonic diverticulosis Nonspecific bilateral inguinal adenopathy as on the prior exam Probable left simple renal cyst. Electronically Signed   By: Prudencio Pair M.D.   On: 01/06/2019 20:17   Dg Chest 2 View  Result Date: 01/06/2019 CLINICAL DATA:  Nausea vomiting.  Weakness and shortness of breath. EXAM: CHEST - 2 VIEW COMPARISON:  10/17/2017 chest CT FINDINGS: Mild thoracic spondylosis. Cardiac and mediastinal margins appear normal. Lungs appear clear. No blunting of the costophrenic angles. IMPRESSION: 1.  No active cardiopulmonary disease is radiographically apparent. 2. Thoracic spondylosis. Electronically Signed   By: Van Clines M.D.   On: 01/06/2019 19:40    EKG: Independently reviewed. Sinus rhythm with motion artifact, similar to prior.  Assessment/Plan Principal Problem:   Acute renal failure (ARF) (HCC) Active Problems:   HTN (hypertension)   DM type 2 (diabetes mellitus, type 2) (HCC)   Paroxysmal SVT (supraventricular tachycardia) (HCC)   Lymphedema  Michelle Persaud is a 73 y.o. female with medical history significant for type 2 diabetes, hypertension, paroxysmal SVT, GERD, and chronic lymphedema who is admitted with acute renal failure and hyperkalemia.   Acute renal failure: In setting of losartan, Lasix, and metformin use and dehydration. -Check urinalysis and FEurea -Obtain renal ultrasound -Hold losartan, Lasix, metformin, and  nephrotoxic medications -Continue IV fluid resuscitation with strict I/O's  Hyperkalemia: K 6.0 on admission in setting of acute renal failure.  Will give Lokelma and continue IV fluids as above.  Type 2 diabetes: On metformin as an outpatient.  Will hold and start sensitive SSI while in hospital.  Hypertension: Holding home losartan and Lasix as above.  Continue metoprolol.  Paroxysmal SVT: In sinus rhythm on admission.  Continue metoprolol.  Microcytic anemia: Currently stable without obvious bleeding.  Chronic lymphedema of lower extremities: Previously followed at wound care.  Recently started on doxycycline for reported cellulitis of both legs.  Legs are wrapped but  no obvious infection, suspect chronic venous stasis changes.  Will hold doxycycline for now and requires wound care evaluation.  DVT prophylaxis: Subcutaneous heparin Code Status: Full code, confirmed with patient Family Communication: Patient discussed with family Disposition Plan: Pending clinical progress Consults called: None Admission status: Inpatient, patient likely requires greater than 2 midnight length stay for management of acute renal failure and significant nausea with inability to maintain adequate oral intake.   Zada Finders MD [facility]  If 7PM-7AM, please contact night-coverage [email or web address]  01/06/2019, 10:02 PM

## 2019-01-06 NOTE — Assessment & Plan Note (Signed)
With some flare, seeing wound clinic next week and currently on doxycycline.

## 2019-01-06 NOTE — ED Provider Notes (Signed)
[facility] DEPT Provider Note   CSN: [id number] Arrival date & time: 01/06/19  1652     History   Chief Complaint Chief Complaint  Patient presents with  . Weakness  . Nausea    HPI [name] [name] is a 73 y.o. female.     Pt presents to the ED today with weakness and nausea.  Pt said she has been nauseous for the last few weeks.  She said she has been taking zofran, but that is not helping.  She denies any pain.  She has not had an appetite and feels dizzy when she moves around.  Pt has chronic lymphedema and was recently been put on doxy.     Past Medical History:  Diagnosis Date  . Acute kidney injury ([place])   . Aortic stenosis, mild 11/17/2013  . Arthritis    "both knees, spine, back, fingers" (11/29/2013)  . CHF (congestive heart failure) ([place])   . Edema   . GERD (gastroesophageal reflux disease)   . Heart murmur   . History of diastolic dysfunction    Echo [id number] with diastolic dysfunction  . HTN (hypertension)   . Morbid obesity ([place])   . OA (osteoarthritis)   . PAC (premature atrial contraction)    per prior Holter  . Palpitations   . Pneumonia    "as a child"  . PVC's (premature ventricular contractions)    per prior Holter  . SVT (supraventricular tachycardia) ([place])   . Tachycardia    noted at 07/28/11 visit. Started on beta blocker. Possible atrial flutter vs long PR tachycardia/AVNRT  . Type II diabetes mellitus [facility])     Patient Active Problem List   Diagnosis Date Noted  . Postnasal drip 11/16/2017  . Sensorineural hearing loss (SNHL), bilateral 11/16/2017  . Tinnitus of both ears 11/16/2017  . Obesity, Class II, BMI 35-39.9, isolated (see actual BMI) 11/08/2017  . CKD (chronic kidney disease), stage III 11/08/2017  . Anemia 11/07/2017  . Thrombocytosis ([place]) 11/07/2017  . Lymphedema 11/07/2017  . AKI (acute kidney injury) ([place]) 10/16/2017  . Routine general medical examination at a health care facility  05/10/2014  . Paroxysmal SVT (supraventricular tachycardia) ([place]) 11/28/2013  . Physical deconditioning 11/23/2013  . Generalized weakness 11/17/2013  . Nausea 11/17/2013  . Multilevel spine pain 11/17/2013  . Aortic stenosis, mild 11/17/2013  . Cellulitis of left lower extremity 11/17/2013  . DM type 2 (diabetes mellitus, type 2) ([place]) 02/02/2013  . Arthritis 10/19/2012  . Mixed incontinence 10/19/2012  . GERD (gastroesophageal reflux disease) 04/20/2011  . HTN (hypertension) 01/20/2011  . Morbid obesity ([place])     Past Surgical History:  Procedure Laterality Date  . CESAREAN SECTION  1985  . CESAREAN SECTION  1985  . SUPRAVENTRICULAR TACHYCARDIA ABLATION  11/29/2013  . SUPRAVENTRICULAR TACHYCARDIA ABLATION N/A 11/29/2013   Procedure: SUPRAVENTRICULAR TACHYCARDIA ABLATION;  Surgeon: Evans Lance, MD;  Location: [facility] CATH LAB;  Service: Cardiovascular;  Laterality: N/A;  . US ECHOCARDIOGRAPHY  07/25/2008   EF 55-60%  . VAGINAL DELIVERY       OB History   No obstetric history on file.      Home Medications    Prior to Admission medications   Medication Sig Start Date End Date Taking? Authorizing Provider  acetaminophen (TYLENOL) 325 MG tablet Take 2 tablets (650 mg total) by mouth every 6 (six) hours as needed for mild pain (or Fever >/= 101). 11/22/13  Yes Delfina Redwood, MD  aspirin EC 81  MG tablet Take 81 mg by mouth daily with breakfast. Takes once or twice weekly   Yes [provider]  doxycycline (VIBRA-TABS) 100 MG tablet Take 100 mg by mouth 2 (two) times daily. 12/30/18  Yes [provider]  furosemide (LASIX) 40 MG tablet TAKE 1 TABLET BY MOUTH EVERY DAY Patient taking differently: Take 40 mg by mouth daily.  01/02/19  Yes Hoyt Koch, MD  losartan (COZAAR) 100 MG tablet TAKE 1 TABLET BY MOUTH EVERY DAY Patient taking differently: Take 100 mg by mouth daily.  10/20/18  Yes Hoyt Koch, MD  metFORMIN (GLUCOPHAGE) 500 MG tablet  Take 1 tablet (500 mg total) by mouth daily with breakfast. NEED OFFICE VISIT FOR FURTHER REFILLS 12/08/18  Yes Hoyt Koch, MD  methocarbamol (ROBAXIN) 500 MG tablet Take 1 tablet (500 mg total) by mouth every 6 (six) hours as needed for muscle spasms. 04/12/17  Yes Hoyt Koch, MD  metoprolol tartrate (LOPRESSOR) 50 MG tablet TAKE 1 TABLET BY MOUTH TWICE A DAY Patient taking differently: Take 50 mg by mouth 2 (two) times daily.  10/13/18  Yes Hoyt Koch, MD  Neomycin-Bacitracin-Polymyxin ([employer] ANTIBIOTIC) [phone] [place] [facility] Patient taking differently: Apply 1 application topically daily. Apply after cleaning before wrapping lgs 11/23/16  Yes Hoyt Koch, MD  ondansetron (ZOFRAN) 4 MG tablet Take 1 tablet (4 mg total) by mouth every 8 (eight) hours as needed for nausea or vomiting. 01/06/19  Yes Hoyt Koch, MD  pantoprazole (PROTONIX) 40 MG tablet Take 1 tablet (40 mg total) by mouth daily. 01/06/19  Yes Hoyt Koch, MD  polyethylene glycol [facility] / GLYCOLAX) packet Take 17 g by mouth daily. 11/09/17  Yes Charlynne Cousins, MD  losartan (COZAAR) 25 MG tablet Take 4 tablets (100 mg total) by mouth daily. Patient not taking: Reported on 01/06/2019 10/20/18   Hoyt Koch, MD  montelukast (SINGULAIR) 10 MG tablet TAKE 1 TABLET BY MOUTH EVERYDAY AT BEDTIME Patient not taking: Reported on 01/06/2019 10/20/18   Hoyt Koch, MD  ondansetron (ZOFRAN-ODT) 8 MG disintegrating tablet Take 1 tablet (8 mg total) by mouth every 8 (eight) hours as needed for nausea or vomiting. Patient not taking: Reported on 01/06/2019 12/17/17   Levin Erp, PA  promethazine (PHENERGAN) 25 MG tablet Take 1 tablet (25 mg total) by mouth every 8 (eight) hours as needed for nausea or vomiting. Patient not taking: Reported on 01/06/2019 01/06/19   Hoyt Koch, MD    Family History Family History   Problem Relation Age of Onset  . Alzheimer's disease Mother   . Lung cancer Mother   . COPD Father   . Diabetes Maternal Uncle   . Stroke Neg Hx   . Colon cancer Neg Hx   . Stomach cancer Neg Hx   . Esophageal cancer Neg Hx     Social History Social History   Tobacco Use  . Smoking status: Former Smoker    Packs/day: 2.00    Years: 15.00    Pack years: 30.00    Types: Cigarettes    Quit date: 11/23/1985    Years since quitting: 33.1  . Smokeless tobacco: Never Used  Substance Use Topics  . Alcohol use: No  . Drug use: No     Allergies   Oxycodone hcl and Penicillins   Review of Systems Review of Systems  Gastrointestinal: Positive for nausea.  Neurological: Positive for weakness.  Physical Exam Updated Vital Signs BP 110/75   Pulse (!) 107   Temp 98.9 F (37.2 C) (Oral)   Resp 20   SpO2 98%   Physical Exam Vitals signs and nursing note reviewed.  Constitutional:      Appearance: Normal appearance. She is obese.  HENT:     Head: Normocephalic and atraumatic.     Right Ear: External ear normal.     Left Ear: External ear normal.     Nose: Nose normal.     Mouth/Throat:     Mouth: Mucous membranes are dry.  Eyes:     Extraocular Movements: Extraocular movements intact.     Conjunctiva/sclera: Conjunctivae normal.     Pupils: Pupils are equal, round, and reactive to light.  Neck:     Musculoskeletal: Normal range of motion and neck supple.  Cardiovascular:     Rate and Rhythm: Normal rate and regular rhythm.     Pulses: Normal pulses.     Heart sounds: Normal heart sounds.  Pulmonary:     Effort: Pulmonary effort is normal.     Breath sounds: Normal breath sounds.  Abdominal:     General: Abdomen is flat. Bowel sounds are normal.     Palpations: Abdomen is soft.  Skin:    General: Skin is warm and dry.     Capillary Refill: Capillary refill takes less than 2 seconds.  Neurological:     General: No focal deficit present.     Mental  Status: She is alert and oriented to person, place, and time.  Psychiatric:        Mood and Affect: Mood normal.        Behavior: Behavior normal.        Thought Content: Thought content normal.        Judgment: Judgment normal.      ED Treatments / Results  Labs (all labs ordered are listed, but only abnormal results are displayed) Labs Reviewed  CBC - Abnormal; Notable for the following components:      Result Value   Hemoglobin 11.7 (*)    MCV 77.5 (*)    MCH 24.1 (*)    RDW 18.1 (*)    All other components within normal limits  LACTIC ACID, PLASMA - Abnormal; Notable for the following components:   Lactic Acid, Venous 2.0 (*)    All other components within normal limits  BASIC METABOLIC PANEL - Abnormal; Notable for the following components:   Potassium 6.0 (*)    CO2 15 (*)    Glucose, Bld 102 (*)    BUN 73 (*)    Creatinine, Ser 4.73 (*)    Calcium 8.6 (*)    GFR calc non Af Amer 9 (*)    GFR calc Af Amer 10 (*)    All other components within normal limits  SARS CORONAVIRUS 2 (HOSPITAL ORDER, [facility])  URINALYSIS, ROUTINE W REFLEX MICROSCOPIC  LACTIC ACID, PLASMA    EKG EKG Interpretation  Date/Time:  Friday January 06 2019 18:51:29 EDT Ventricular Rate:  91 PR Interval:    QRS Duration: 118 QT Interval:  387 QTC Calculation: 477 R Axis:   -35 Text Interpretation:  Sinus rhythm Left ventricular hypertrophy ST elevation, consider inferior injury Baseline wander in lead(s) I III aVL V1 No significant change since last tracing Confirmed by Isla Pence [phone]) on 01/06/2019 8:32:35 PM   Radiology Ct Abdomen Pelvis Wo Contrast  Result Date: 01/06/2019 CLINICAL  DATA:  Nausea, vomiting EXAM: CT ABDOMEN AND PELVIS WITHOUT CONTRAST TECHNIQUE: Multidetector CT imaging of the abdomen and pelvis was performed following the standard protocol without IV contrast. COMPARISON:  October 16, 2017 FINDINGS: Lower chest: The visualized heart size  within normal limits. No pericardial fluid/thickening. No hiatal hernia. The visualized portions of the lungs are clear. Hepatobiliary: Although limited due to the lack of intravenous contrast, normal in appearance without gross focal abnormality. No evidence of calcified gallstones or biliary ductal dilatation. Pancreas:  Unremarkable.  No surrounding inflammatory changes. Spleen: Normal in size. Although limited due to the lack of intravenous contrast, normal in appearance. Adrenals/Urinary Tract: Both adrenal glands appear normal. Again noted is a low-density lesion in the upper pole the left kidney measuring 2.5 cm. The right kidney is unremarkable. No renal, collecting system, or bladder calculi are seen. Stomach/Bowel: The stomach and small bowel are unremarkable. Scattered colonic diverticula are noted without diverticulitis. The appendix is unremarkable. Vascular/Lymphatic: There is scattered bilateral inguinal lymph nodes, as on the prior exam, the largest measuring 1.5 cm in the right inguinal region. No definite intra-abdominal adenopathy seen. Reproductive: The uterus and adnexa are unremarkable. Other: No evidence of abdominal wall mass or hernia. Musculoskeletal: No acute or significant osseous findings. There is diffuse osteopenia and bilateral hip osteoarthritis. Degenerative changes in the thoracolumbar spine. IMPRESSION: No acute intra-abdominal or pelvic abnormality. Colonic diverticulosis Nonspecific bilateral inguinal adenopathy as on the prior exam Probable left simple renal cyst. Electronically Signed   By: Prudencio Pair M.D.   On: 01/06/2019 20:17   Dg Chest 2 View  Result Date: 01/06/2019 CLINICAL DATA:  Nausea vomiting.  Weakness and shortness of breath. EXAM: CHEST - 2 VIEW COMPARISON:  10/17/2017 chest CT FINDINGS: Mild thoracic spondylosis. Cardiac and mediastinal margins appear normal. Lungs appear clear. No blunting of the costophrenic angles. IMPRESSION: 1.  No active  cardiopulmonary disease is radiographically apparent. 2. Thoracic spondylosis. Electronically Signed   By: Van Clines M.D.   On: 01/06/2019 19:40    Procedures Procedures (including critical care time)  Medications Ordered in ED Medications  promethazine (PHENERGAN) injection 12.5 mg (has no administration in time range)  sodium chloride 0.9 % bolus 1,000 mL (1,000 mLs Intravenous New Bag/Given 01/06/19 1833)  ondansetron (ZOFRAN) injection 4 mg (4 mg Intravenous Given 01/06/19 1832)     Initial Impression / Assessment and Plan / ED Course  I have reviewed the triage vital signs and the nursing notes.  Pertinent labs & imaging results that were available during my care of the patient were reviewed by me and considered in my medical decision making (see chart for details).        Pt's labs were repeated to make sure they were not a lab error.  Pt is in ARF.  K is elevated, but this is treated with IVFs.  Pt's ct scan reviewed.  No acute process.  Pt d/w Dr. Posey Pronto (triad) for admission.    Final Clinical Impressions(s) / ED Diagnoses   Final diagnoses:  Hyperkalemia  Acute renal failure, unspecified acute renal failure type [facility])  Dehydration  Nausea  Lymphedema    ED Discharge Orders    None       Isla Pence, MD 01/06/19 2109

## 2019-01-06 NOTE — Assessment & Plan Note (Signed)
Previously and she is clinically dehydrated today. Depending on labs she would like to adjust nausea medicine and try rehydration at home. If Cr elevated may need IV fluids and nausea control.

## 2019-01-07 ENCOUNTER — Encounter (HOSPITAL_COMMUNITY): Payer: Self-pay

## 2019-01-07 LAB — CBC
HCT: 27.5 % — ABNORMAL LOW (ref 36.0–46.0)
Hemoglobin: 8.2 g/dL — ABNORMAL LOW (ref 12.0–15.0)
MCH: 23.7 pg — ABNORMAL LOW (ref 26.0–34.0)
MCHC: 29.8 g/dL — ABNORMAL LOW (ref 30.0–36.0)
MCV: 79.5 fL — ABNORMAL LOW (ref 80.0–100.0)
Platelets: 383 10*3/uL (ref 150–400)
RBC: 3.46 MIL/uL — ABNORMAL LOW (ref 3.87–5.11)
RDW: 18.1 % — ABNORMAL HIGH (ref 11.5–15.5)
WBC: 6.7 10*3/uL (ref 4.0–10.5)
nRBC: 0 % (ref 0.0–0.2)

## 2019-01-07 LAB — URINALYSIS, ROUTINE W REFLEX MICROSCOPIC
Bilirubin Urine: NEGATIVE
Glucose, UA: NEGATIVE mg/dL
Ketones, ur: NEGATIVE mg/dL
Nitrite: NEGATIVE
Protein, ur: NEGATIVE mg/dL
Specific Gravity, Urine: 1.016 (ref 1.005–1.030)
pH: 5 (ref 5.0–8.0)

## 2019-01-07 LAB — CREATININE, URINE, RANDOM: Creatinine, Urine: 312.32 mg/dL

## 2019-01-07 LAB — BASIC METABOLIC PANEL
Anion gap: 7 (ref 5–15)
BUN: 65 mg/dL — ABNORMAL HIGH (ref 8–23)
CO2: 18 mmol/L — ABNORMAL LOW (ref 22–32)
Calcium: 8.4 mg/dL — ABNORMAL LOW (ref 8.9–10.3)
Chloride: 113 mmol/L — ABNORMAL HIGH (ref 98–111)
Creatinine, Ser: 3.85 mg/dL — ABNORMAL HIGH (ref 0.44–1.00)
GFR calc Af Amer: 13 mL/min — ABNORMAL LOW (ref >60)
GFR calc non Af Amer: 11 mL/min — ABNORMAL LOW (ref >60)
Glucose, Bld: 79 mg/dL (ref 70–99)
Potassium: 5.2 mmol/L — ABNORMAL HIGH (ref 3.5–5.1)
Sodium: 138 mmol/L (ref 135–145)

## 2019-01-07 LAB — GLUCOSE, CAPILLARY
Glucose-Capillary: 83 mg/dL (ref 70–99)
Glucose-Capillary: 94 mg/dL (ref 70–99)
Glucose-Capillary: 119 mg/dL — ABNORMAL HIGH (ref 70–99)

## 2019-01-07 LAB — SODIUM, URINE, RANDOM: Sodium, Ur: <10 mmol/L

## 2019-01-07 LAB — FERRITIN: Ferritin: 30 ng/mL (ref 11–307)

## 2019-01-07 LAB — VITAMIN B12: Vitamin B-12: 1382 pg/mL — ABNORMAL HIGH (ref 180–914)

## 2019-01-07 LAB — HEMOGLOBIN A1C
Hgb A1c MFr Bld: 5.8 % — ABNORMAL HIGH (ref 4.8–5.6)
Mean Plasma Glucose: 119.76 mg/dL

## 2019-01-07 MED ORDER — METOPROLOL TARTRATE 25 MG PO TABS
25.0000 mg | ORAL_TABLET | Freq: Once | ORAL | Status: AC
  Administered 2019-01-07: 25 mg via ORAL
  Filled 2019-01-07: qty 1

## 2019-01-07 MED ORDER — LACTATED RINGERS IV SOLN
INTRAVENOUS | Status: DC
  Administered 2019-01-07 – 2019-01-09 (×6): via INTRAVENOUS

## 2019-01-07 NOTE — Progress Notes (Signed)
PROGRESS NOTE    [name] [name]  [id number] DOB: [birth year] DOA: 01/06/2019 PCP: Hoyt Koch, MD    Brief Narrative:  73 year old female with history of type 2 diabetes on metformin, hypertension, paroxysmal SVT, GERD and chronic lymphedema and open wounds of the lower extremities presented to the emergency room for evaluation of persistent nausea and abnormal labs as done in her primary care physician's office.  She was found to have significant acute renal failure and hyperkalemia and was sent to the ER.   Assessment & Plan:   Principal Problem:   Acute renal failure (ARF) (HCC) Active Problems:   HTN (hypertension)   DM type 2 (diabetes mellitus, type 2) (HCC)   Paroxysmal SVT (supraventricular tachycardia) (HCC)   Lymphedema  Acute renal failure: Suspect prerenal failure.  She was having poor appetite and nausea and continue to use losartan Lasix and metformin.  Renal ultrasound with no evidence of obstruction or hydronephrosis.  Some clinical improvement with IV fluid. Continue IV fluid, changed to lactated Ringer.  Continue intake and output monitoring.  Continue to hold losartan, Lasix metformin and nephrotoxic medications.  Will recheck level every day in the morning to ensure normalization.  Hyperkalemia: Potassium 6 on admission.  Was given Lokelma.  Improved to 5.2.  Will recheck tomorrow morning.  Type 2 diabetes on metformin: Well controlled.  Holding metformin due to acute renal failure.  On sliding scale insulin.  Hypertension: Blood pressure is normal or low normal.  Hold all antihypertensives.  Continue metoprolol for SVTs.  Chronic microcytic anemia: Baseline hemoglobin is 8.  Presentation hemoglobin 10 due to severe dehydration.  Will monitor.  Chronic lymphedema bilateral leg with open wounds: Was started on doxycycline from outpatient.  She has severe lymphedema, chronic changes and skin breakdown, she will need aggressive local wound care.  Consult  wound care.  We will continue bedside dressing.  Antibiotics are not indicated at this time.   DVT prophylaxis: Subcu heparin Code Status: Full code Family Communication: None Disposition Plan: Remains in the hospital.   Consultants:   None  Procedures:   None  Antimicrobials:   None   Subjective: Patient seen and examined.  Less nausea.  Was able to eat and drink.  Unknown what caused her poor appetite.  Patient states sweet test for everything. No other overnight events.  Objective: Vitals:   01/07/19 0128 01/07/19 0443 01/07/19 0445 01/07/19 0500  BP: (!) 119/56 (!) 96/58 (!) 95/49   Pulse: 83 67 69   Resp:  18    Temp:  98.2 F (36.8 C)    TempSrc:  Oral    SpO2:  98% 99%   Weight:    113.5 kg  Height:        Intake/Output Summary (Last 24 hours) at 01/07/2019 1048 Last data filed at 01/07/2019 0829 Gross per 24 hour  Intake 2177.41 ml  Output 400 ml  Net 1777.41 ml   Filed Weights   01/06/19 2317 01/07/19 0500  Weight: 112.9 kg 113.5 kg    Examination:  General exam: Appears calm and comfortable, on room air.  Eating breakfast. Respiratory system: Clear to auscultation. Respiratory effort normal. Cardiovascular system: S1 & S2 heard, RRR. No JVD, murmurs, rubs, gallops or clicks. No pedal edema. Gastrointestinal system: Abdomen is nondistended, soft and nontender. No organomegaly or masses felt. Normal bowel sounds heard. Central nervous system: Alert and oriented. No focal neurological deficits. Extremities: Symmetric 5 x 5 power. Psychiatry: Judgement and insight appear normal. Mood &  affect appropriate.  Bilateral lower extremity with chronic lymphedema, pictures as below. Left leg   eft leg    Right leg     Data Reviewed: I have personally reviewed following labs and imaging studies  CBC: Recent Labs  Lab 01/06/19 0919 01/06/19 1832 01/07/19 0438  WBC 9.2 6.5 6.7  HGB 10.6* 11.7* 8.2*  HCT 32.8* 37.6 27.5*  MCV 75.4* 77.5* 79.5*   PLT 469.0* 355 [id number]   Basic Metabolic Panel: Recent Labs  Lab 01/06/19 0919 01/06/19 2023 01/07/19 0438  NA 136 135 138  K 5.7* 6.0* 5.2*  CL 105 110 113*  CO2 16* 15* 18*  GLUCOSE 110* 102* 79  BUN 67* 73* 65*  CREATININE 4.44* 4.73* 3.85*  CALCIUM 9.7 8.6* 8.4*   GFR: Estimated Creatinine Clearance: 18.3 mL/min (A) (by C-G formula based on SCr of 3.85 mg/dL (H)). Liver Function Tests: Recent Labs  Lab 01/06/19 0919  AST 17  ALT 11  ALKPHOS 71  BILITOT 0.4  PROT 8.4*  ALBUMIN 3.6   Recent Labs  Lab 01/06/19 0919  LIPASE 27.0   No results for input(s): AMMONIA in the last 168 hours. Coagulation Profile: No results for input(s): INR, PROTIME in the last 168 hours. Cardiac Enzymes: No results for input(s): CKTOTAL, CKMB, CKMBINDEX, TROPONINI in the last 168 hours. BNP (last 3 results) No results for input(s): PROBNP in the last 8760 hours. HbA1C: Recent Labs    01/07/19 0438  HGBA1C 5.8*   CBG: Recent Labs  Lab 01/06/19 2251 01/07/19 0744  GLUCAP 90 83   Lipid Profile: No results for input(s): CHOL, HDL, LDLCALC, TRIG, CHOLHDL, LDLDIRECT in the last 72 hours. Thyroid Function Tests: No results for input(s): TSH, T4TOTAL, FREET4, T3FREE, THYROIDAB in the last 72 hours. Anemia Panel: Recent Labs    01/07/19 0438  VITAMINB12 1,382*  FERRITIN 30   Sepsis Labs: Recent Labs  Lab 01/06/19 1832 01/06/19 2141  LATICACIDVEN 2.0* 1.4    Recent Results (from the past 240 hour(s))  SARS Coronavirus 2 [facility] order, Performed in [facility] hospital lab) Nasopharyngeal Nasopharyngeal Swab     Status: None   Collection Time: 01/06/19  9:41 PM   Specimen: Nasopharyngeal Swab  Result Value Ref Range Status   SARS Coronavirus 2 NEGATIVE NEGATIVE Final    Comment: (NOTE) If result is NEGATIVE SARS-CoV-2 target nucleic acids are NOT DETECTED. The SARS-CoV-2 RNA is generally detectable in upper and lower  respiratory specimens during the acute phase of  infection. The lowest  concentration of SARS-CoV-2 viral copies this assay can detect is 250  copies / mL. A negative result does not preclude SARS-CoV-2 infection  and should not be used as the sole basis for treatment or other  patient management decisions.  A negative result may occur with  improper specimen collection / handling, submission of specimen other  than nasopharyngeal swab, presence of viral mutation(s) within the  areas targeted by this assay, and inadequate number of viral copies  (<250 copies / mL). A negative result must be combined with clinical  observations, patient history, and epidemiological information. If result is POSITIVE SARS-CoV-2 target nucleic acids are DETECTED. The SARS-CoV-2 RNA is generally detectable in upper and lower  respiratory specimens dur ing the acute phase of infection.  Positive  results are indicative of active infection with SARS-CoV-2.  Clinical  correlation with patient history and other diagnostic information is  necessary to determine patient infection status.  Positive results do  not rule out  bacterial infection or co-infection with other viruses. If result is PRESUMPTIVE POSTIVE SARS-CoV-2 nucleic acids MAY BE PRESENT.   A presumptive positive result was obtained on the submitted specimen  and confirmed on repeat testing.  While 2019 novel coronavirus  (SARS-CoV-2) nucleic acids may be present in the submitted sample  additional confirmatory testing may be necessary for epidemiological  and / or clinical management purposes  to differentiate between  SARS-CoV-2 and other Sarbecovirus currently known to infect humans.  If clinically indicated additional testing with an alternate test  methodology [phone]) is advised. The SARS-CoV-2 RNA is generally  detectable in upper and lower respiratory sp ecimens during the acute  phase of infection. The expected result is Negative. Fact Sheet for Patients:   StrictlyIdeas.no Fact Sheet for Healthcare Providers: BankingDealers.co.za This test is not yet approved or cleared by the [place] FDA and has been authorized for detection and/or diagnosis of SARS-CoV-2 by FDA under an Emergency Use Authorization (EUA).  This EUA will remain in effect (meaning this test can be used) for the duration of the COVID-19 declaration under Section 564(b)(1) of the Act, 21 U.S.C. section 360bbb-3(b)(1), unless the authorization is terminated or revoked sooner. Performed at [facility], [place] [address], [place], [place] [address]          Radiology Studies: Ct Abdomen Pelvis Wo Contrast  Result Date: 01/06/2019 CLINICAL DATA:  Nausea, vomiting EXAM: CT ABDOMEN AND PELVIS WITHOUT CONTRAST TECHNIQUE: Multidetector CT imaging of the abdomen and pelvis was performed following the standard protocol without IV contrast. COMPARISON:  October 16, 2017 FINDINGS: Lower chest: The visualized heart size within normal limits. No pericardial fluid/thickening. No hiatal hernia. The visualized portions of the lungs are clear. Hepatobiliary: Although limited due to the lack of intravenous contrast, normal in appearance without gross focal abnormality. No evidence of calcified gallstones or biliary ductal dilatation. Pancreas:  Unremarkable.  No surrounding inflammatory changes. Spleen: Normal in size. Although limited due to the lack of intravenous contrast, normal in appearance. Adrenals/Urinary Tract: Both adrenal glands appear normal. Again noted is a low-density lesion in the upper pole the left kidney measuring 2.5 cm. The right kidney is unremarkable. No renal, collecting system, or bladder calculi are seen. Stomach/Bowel: The stomach and small bowel are unremarkable. Scattered colonic diverticula are noted without diverticulitis. The appendix is unremarkable. Vascular/Lymphatic: There is scattered bilateral  inguinal lymph nodes, as on the prior exam, the largest measuring 1.5 cm in the right inguinal region. No definite intra-abdominal adenopathy seen. Reproductive: The uterus and adnexa are unremarkable. Other: No evidence of abdominal wall mass or hernia. Musculoskeletal: No acute or significant osseous findings. There is diffuse osteopenia and bilateral hip osteoarthritis. Degenerative changes in the thoracolumbar spine. IMPRESSION: No acute intra-abdominal or pelvic abnormality. Colonic diverticulosis Nonspecific bilateral inguinal adenopathy as on the prior exam Probable left simple renal cyst. Electronically Signed   By: Prudencio Pair M.D.   On: 01/06/2019 20:17   Dg Chest 2 View  Result Date: 01/06/2019 CLINICAL DATA:  Nausea vomiting.  Weakness and shortness of breath. EXAM: CHEST - 2 VIEW COMPARISON:  10/17/2017 chest CT FINDINGS: Mild thoracic spondylosis. Cardiac and mediastinal margins appear normal. Lungs appear clear. No blunting of the costophrenic angles. IMPRESSION: 1.  No active cardiopulmonary disease is radiographically apparent. 2. Thoracic spondylosis. Electronically Signed   By: Van Clines M.D.   On: 01/06/2019 19:40   US Renal  Result Date: 01/06/2019 CLINICAL DATA:  Acute renal failure EXAM: RENAL /  URINARY TRACT ULTRASOUND COMPLETE COMPARISON:  CT same day FINDINGS: Right Kidney: Renal measurements: 10.2 x 5.7 x 4.8 = volume: 148 mL. There is a small anechoic simple cyst seen within the mid pole measuring 0.9 x 0.7 by 1.1 cm. No hydronephrosis or shadowing stone. Left Kidney: Renal measurements: 9.1 x 5.4 x 3.9 = volume: 99 mL. Echogenicity within normal limits. No mass or hydronephrosis visualized. Bladder: Appears normal for degree of bladder distention. IMPRESSION: No hydronephrosis or shadowing stones. Electronically Signed   By: Prudencio Pair M.D.   On: 01/06/2019 22:42        Scheduled Meds: . heparin  5,000 Units Subcutaneous Q8H  . insulin aspart  0-9 Units  Subcutaneous TID WC  . metoprolol tartrate  50 mg Oral BID  . sodium chloride flush  3 mL Intravenous Q12H   Continuous Infusions: . lactated ringers       LOS: 1 day    Time spent: 35 minutes    Barb Merino, MD [facility] Pager [phone]  If 7PM-7AM, please contact night-coverage [email or web address] Password [email or web address] 01/07/2019, 10:48 AM

## 2019-01-07 NOTE — Progress Notes (Signed)
MD Ghimire made aware of patient's BP 87/52 at 1100. AM metoprolol held. Pt asymptomatic, does not feel weak or dizzy unless she stands up. Currently resting in bed comfortably. Wet-to-dry dressings changed to BLE as ordered. Will continue to monitor closely.

## 2019-01-08 DIAGNOSIS — I890 Lymphedema, not elsewhere classified: Secondary | ICD-10-CM

## 2019-01-08 DIAGNOSIS — E119 Type 2 diabetes mellitus without complications: Secondary | ICD-10-CM

## 2019-01-08 DIAGNOSIS — I471 Supraventricular tachycardia: Secondary | ICD-10-CM

## 2019-01-08 LAB — CBC WITH DIFFERENTIAL/PLATELET
Abs Immature Granulocytes: 0.03 10*3/uL (ref 0.00–0.07)
Basophils Absolute: 0.1 10*3/uL (ref 0.0–0.1)
Basophils Relative: 1 %
Eosinophils Absolute: 0.2 10*3/uL (ref 0.0–0.5)
Eosinophils Relative: 4 %
HCT: 26.7 % — ABNORMAL LOW (ref 36.0–46.0)
Hemoglobin: 8 g/dL — ABNORMAL LOW (ref 12.0–15.0)
Immature Granulocytes: 1 %
Lymphocytes Relative: 35 %
Lymphs Abs: 1.9 10*3/uL (ref 0.7–4.0)
MCH: 23.9 pg — ABNORMAL LOW (ref 26.0–34.0)
MCHC: 30 g/dL (ref 30.0–36.0)
MCV: 79.7 fL — ABNORMAL LOW (ref 80.0–100.0)
Monocytes Absolute: 0.6 10*3/uL (ref 0.1–1.0)
Monocytes Relative: 10 %
Neutro Abs: 2.7 10*3/uL (ref 1.7–7.7)
Neutrophils Relative %: 49 %
Platelets: 338 10*3/uL (ref 150–400)
RBC: 3.35 MIL/uL — ABNORMAL LOW (ref 3.87–5.11)
RDW: 18.3 % — ABNORMAL HIGH (ref 11.5–15.5)
WBC: 5.5 10*3/uL (ref 4.0–10.5)
nRBC: 0 % (ref 0.0–0.2)

## 2019-01-08 LAB — GLUCOSE, CAPILLARY
Glucose-Capillary: 88 mg/dL (ref 70–99)
Glucose-Capillary: 73 mg/dL (ref 70–99)
Glucose-Capillary: 111 mg/dL — ABNORMAL HIGH (ref 70–99)
Glucose-Capillary: 120 mg/dL — ABNORMAL HIGH (ref 70–99)
Glucose-Capillary: 109 mg/dL — ABNORMAL HIGH (ref 70–99)

## 2019-01-08 LAB — BASIC METABOLIC PANEL
Anion gap: 4 — ABNORMAL LOW (ref 5–15)
BUN: 57 mg/dL — ABNORMAL HIGH (ref 8–23)
CO2: 19 mmol/L — ABNORMAL LOW (ref 22–32)
Calcium: 8.2 mg/dL — ABNORMAL LOW (ref 8.9–10.3)
Chloride: 115 mmol/L — ABNORMAL HIGH (ref 98–111)
Creatinine, Ser: 2.33 mg/dL — ABNORMAL HIGH (ref 0.44–1.00)
GFR calc Af Amer: 23 mL/min — ABNORMAL LOW (ref >60)
GFR calc non Af Amer: 20 mL/min — ABNORMAL LOW (ref >60)
Glucose, Bld: 84 mg/dL (ref 70–99)
Potassium: 5 mmol/L (ref 3.5–5.1)
Sodium: 138 mmol/L (ref 135–145)

## 2019-01-08 LAB — MAGNESIUM: Magnesium: 2.3 mg/dL (ref 1.7–2.4)

## 2019-01-08 MED ORDER — DIPHENHYDRAMINE HCL 25 MG PO CAPS
25.0000 mg | ORAL_CAPSULE | Freq: Once | ORAL | Status: AC
  Administered 2019-01-08: 25 mg via ORAL
  Filled 2019-01-08: qty 1

## 2019-01-08 MED ORDER — ALUM & MAG HYDROXIDE-SIMETH 200-200-20 MG/5ML PO SUSP
30.0000 mL | Freq: Four times a day (QID) | ORAL | Status: DC | PRN
  Administered 2019-01-08: 30 mL via ORAL
  Filled 2019-01-08: qty 30

## 2019-01-08 MED ORDER — PANTOPRAZOLE SODIUM 40 MG PO TBEC
40.0000 mg | DELAYED_RELEASE_TABLET | Freq: Two times a day (BID) | ORAL | Status: DC
  Administered 2019-01-08 – 2019-01-09 (×3): 40 mg via ORAL
  Filled 2019-01-08 (×3): qty 1

## 2019-01-08 NOTE — Consult Note (Signed)
[facility] [facility] Nurse wound consult note Patient receiving care in [place].  Consult completed remotely after review of record, including images. Reason for Consult: BLE wounds Wound type: patient with Venous ulcers and lymphedema Wound bed: pink and clean Periwound: thickened, darkened, flaking Dressing procedure/placement/frequency:  Wash BLE with soap and water. Thoroughly dry. Apply moisturizing ointment (pink and white tube in clean utility).  Place Telfa pads over the wound areas.  THEN CALL Ortho Tech for [employer] placement. Thank you for the consult.  [place] nurse will not follow at this time.  Please re-consult the [place] team if needed.  Val Riles, RN, MSN, CWOCN, CNS-BC, pager [phone]

## 2019-01-08 NOTE — Progress Notes (Signed)
BLE  wounds were cleaned and Kerlix was used to wrap the legs.

## 2019-01-08 NOTE — Progress Notes (Signed)
PCP was notified as patient was itching.

## 2019-01-08 NOTE — Progress Notes (Signed)
PROGRESS NOTE    [name] [name]  [id number] DOB: 07-24-1945 DOA: 01/06/2019 PCP: Hoyt Koch, MD    Brief Narrative:  73 year old female with history of type 2 diabetes on metformin, hypertension, paroxysmal SVT, GERD and chronic lymphedema and open wounds of the lower extremities presented to the emergency room for evaluation of persistent nausea and abnormal labs as done in her primary care physician's office.  She was found to have significant acute renal failure and hyperkalemia and was sent to the ER.   Assessment & Plan:   Principal Problem:   Acute renal failure (ARF) (HCC) Active Problems:   HTN (hypertension)   DM type 2 (diabetes mellitus, type 2) (HCC)   Paroxysmal SVT (supraventricular tachycardia) (HCC)   Lymphedema  Acute renal failure: Suspect prerenal failure.  She was having poor appetite and nausea and continue to use losartan Lasix and metformin.  Renal ultrasound with no evidence of obstruction or hydronephrosis.  Some clinical improvement with IV fluid. Continue lactated Ringer.  Appropriately responding.Continue intake and output monitoring.  Continue to hold losartan, Lasix metformin and nephrotoxic medications.  Will recheck level every day in the morning to ensure normalization. Complains of reflux symptoms, will increase dose of Protonix to twice a day.  No active nausea or vomiting.  No diarrhea.  Hyperkalemia: Potassium 6 on admission.  Was given Lokelma.  Improved to 5. Recheck levels in the morning.  Type 2 diabetes on metformin: Well controlled.  Holding metformin due to acute renal failure.  On sliding scale insulin.  Hypertension: Blood pressure is normal or low normal.  Hold all antihypertensives.  Continue metoprolol for SVTs.  Chronic microcytic anemia: Baseline hemoglobin is 8.  Presentation hemoglobin 10 due to severe dehydration.  Will monitor.  At her usual levels.  Chronic lymphedema bilateral leg with open wounds: Was started  on doxycycline from outpatient.  She has severe lymphedema, chronic changes and skin breakdown, she will need aggressive local wound care.  Local wound care.  Unna boot.  Outpatient wound care follow-up.  Antibiotics will not penetrate through her tissues.   DVT prophylaxis: Subcu heparin Code Status: Full code Family Communication: None Disposition Plan: Remains in the hospital.  Anticipate discharge home tomorrow if renal functions normalized.   Consultants:   None  Procedures:   None  Antimicrobials:   None   Subjective: Patient seen and examined.  Complains of reflux-like symptoms and recently started on pantoprazole.  No vomiting.  Had some itchiness on her throat that she thinks it could be acid.  Renal functions improving.  Blood pressures better.  Able to stand up without dizziness.  Uses wheeled walker to walk.   Objective: Vitals:   01/07/19 2255 01/08/19 0416 01/08/19 1048 01/08/19 1249  BP: (!) 111/58 (!) 104/50 (!) 95/54 105/60  Pulse: 80  72 (!) 56  Resp:  18    Temp:  99.1 F (37.3 C)    TempSrc:  Oral    SpO2:  100%    Weight:  114.5 kg    Height:        Intake/Output Summary (Last 24 hours) at 01/08/2019 1412 Last data filed at 01/08/2019 0415 Gross per 24 hour  Intake 1983.26 ml  Output 1075 ml  Net 908.26 ml   Filed Weights   01/06/19 2317 01/07/19 0500 01/08/19 0416  Weight: 112.9 kg 113.5 kg 114.5 kg    Examination:  General exam: Appears calm and comfortable, on room air.  Respiratory system: Clear to auscultation. Respiratory  effort normal. Cardiovascular system: S1 & S2 heard, RRR. No JVD, murmurs, rubs, gallops or clicks. No pedal edema. Gastrointestinal system: Abdomen is nondistended, soft and nontender. No organomegaly or masses felt. Normal bowel sounds heard. Central nervous system: Alert and oriented. No focal neurological deficits. Extremities: Symmetric 5 x 5 power. Psychiatry: Judgement and insight appear normal. Mood &  affect appropriate.  Bilateral lower extremity with chronic lymphedema, pictures as below. Left leg   eft leg    Right leg     Data Reviewed: I have personally reviewed following labs and imaging studies  CBC: Recent Labs  Lab 01/06/19 0919 01/06/19 1832 01/07/19 0438 01/08/19 0355  WBC 9.2 6.5 6.7 5.5  NEUTROABS  --   --   --  2.7  HGB 10.6* 11.7* 8.2* 8.0*  HCT 32.8* 37.6 27.5* 26.7*  MCV 75.4* 77.5* 79.5* 79.7*  PLT 469.0* 355 383 [id number]   Basic Metabolic Panel: Recent Labs  Lab 01/06/19 0919 01/06/19 2023 01/07/19 0438 01/08/19 0355  NA 136 135 138 138  K 5.7* 6.0* 5.2* 5.0  CL 105 110 113* 115*  CO2 16* 15* 18* 19*  GLUCOSE 110* 102* 79 84  BUN 67* 73* 65* 57*  CREATININE 4.44* 4.73* 3.85* 2.33*  CALCIUM 9.7 8.6* 8.4* 8.2*  MG  --   --   --  2.3   GFR: Estimated Creatinine Clearance: 30.4 mL/min (A) (by C-G formula based on SCr of 2.33 mg/dL (H)). Liver Function Tests: Recent Labs  Lab 01/06/19 0919  AST 17  ALT 11  ALKPHOS 71  BILITOT 0.4  PROT 8.4*  ALBUMIN 3.6   Recent Labs  Lab 01/06/19 0919  LIPASE 27.0   No results for input(s): AMMONIA in the last 168 hours. Coagulation Profile: No results for input(s): INR, PROTIME in the last 168 hours. Cardiac Enzymes: No results for input(s): CKTOTAL, CKMB, CKMBINDEX, TROPONINI in the last 168 hours. BNP (last 3 results) No results for input(s): PROBNP in the last 8760 hours. HbA1C: Recent Labs    01/07/19 0438  HGBA1C 5.8*   CBG: Recent Labs  Lab 01/07/19 1221 01/07/19 1648 01/08/19 0406 01/08/19 0733 01/08/19 1119  GLUCAP 94 119* 88 73 111*   Lipid Profile: No results for input(s): CHOL, HDL, LDLCALC, TRIG, CHOLHDL, LDLDIRECT in the last 72 hours. Thyroid Function Tests: No results for input(s): TSH, T4TOTAL, FREET4, T3FREE, THYROIDAB in the last 72 hours. Anemia Panel: Recent Labs    01/07/19 0438  VITAMINB12 1,382*  FERRITIN 30   Sepsis Labs: Recent Labs  Lab 01/06/19  1832 01/06/19 2141  LATICACIDVEN 2.0* 1.4    Recent Results (from the past 240 hour(s))  SARS Coronavirus 2 [facility] order, Performed in [facility] hospital lab) Nasopharyngeal Nasopharyngeal Swab     Status: None   Collection Time: 01/06/19  9:41 PM   Specimen: Nasopharyngeal Swab  Result Value Ref Range Status   SARS Coronavirus 2 NEGATIVE NEGATIVE Final    Comment: (NOTE) If result is NEGATIVE SARS-CoV-2 target nucleic acids are NOT DETECTED. The SARS-CoV-2 RNA is generally detectable in upper and lower  respiratory specimens during the acute phase of infection. The lowest  concentration of SARS-CoV-2 viral copies this assay can detect is 250  copies / mL. A negative result does not preclude SARS-CoV-2 infection  and should not be used as the sole basis for treatment or other  patient management decisions.  A negative result may occur with  improper specimen collection / handling, submission of specimen other  than nasopharyngeal swab, presence of viral mutation(s) within the  areas targeted by this assay, and inadequate number of viral copies  (<250 copies / mL). A negative result must be combined with clinical  observations, patient history, and epidemiological information. If result is POSITIVE SARS-CoV-2 target nucleic acids are DETECTED. The SARS-CoV-2 RNA is generally detectable in upper and lower  respiratory specimens dur ing the acute phase of infection.  Positive  results are indicative of active infection with SARS-CoV-2.  Clinical  correlation with patient history and other diagnostic information is  necessary to determine patient infection status.  Positive results do  not rule out bacterial infection or co-infection with other viruses. If result is PRESUMPTIVE POSTIVE SARS-CoV-2 nucleic acids MAY BE PRESENT.   A presumptive positive result was obtained on the submitted specimen  and confirmed on repeat testing.  While 2019 novel coronavirus  (SARS-CoV-2)  nucleic acids may be present in the submitted sample  additional confirmatory testing may be necessary for epidemiological  and / or clinical management purposes  to differentiate between  SARS-CoV-2 and other Sarbecovirus currently known to infect humans.  If clinically indicated additional testing with an alternate test  methodology [phone]) is advised. The SARS-CoV-2 RNA is generally  detectable in upper and lower respiratory sp ecimens during the acute  phase of infection. The expected result is Negative. Fact Sheet for Patients:  StrictlyIdeas.no Fact Sheet for Healthcare Providers: BankingDealers.co.za This test is not yet approved or cleared by the [place] FDA and has been authorized for detection and/or diagnosis of SARS-CoV-2 by FDA under an Emergency Use Authorization (EUA).  This EUA will remain in effect (meaning this test can be used) for the duration of the COVID-19 declaration under Section 564(b)(1) of the Act, 21 U.S.C. section 360bbb-3(b)(1), unless the authorization is terminated or revoked sooner. Performed at [facility], [place] [address] [place], [place] [address]          Radiology Studies: Ct Abdomen Pelvis Wo Contrast  Result Date: 01/06/2019 CLINICAL DATA:  Nausea, vomiting EXAM: CT ABDOMEN AND PELVIS WITHOUT CONTRAST TECHNIQUE: Multidetector CT imaging of the abdomen and pelvis was performed following the standard protocol without IV contrast. COMPARISON:  October 16, 2017 FINDINGS: Lower chest: The visualized heart size within normal limits. No pericardial fluid/thickening. No hiatal hernia. The visualized portions of the lungs are clear. Hepatobiliary: Although limited due to the lack of intravenous contrast, normal in appearance without gross focal abnormality. No evidence of calcified gallstones or biliary ductal dilatation. Pancreas:  Unremarkable.  No surrounding inflammatory changes.  Spleen: Normal in size. Although limited due to the lack of intravenous contrast, normal in appearance. Adrenals/Urinary Tract: Both adrenal glands appear normal. Again noted is a low-density lesion in the upper pole the left kidney measuring 2.5 cm. The right kidney is unremarkable. No renal, collecting system, or bladder calculi are seen. Stomach/Bowel: The stomach and small bowel are unremarkable. Scattered colonic diverticula are noted without diverticulitis. The appendix is unremarkable. Vascular/Lymphatic: There is scattered bilateral inguinal lymph nodes, as on the prior exam, the largest measuring 1.5 cm in the right inguinal region. No definite intra-abdominal adenopathy seen. Reproductive: The uterus and adnexa are unremarkable. Other: No evidence of abdominal wall mass or hernia. Musculoskeletal: No acute or significant osseous findings. There is diffuse osteopenia and bilateral hip osteoarthritis. Degenerative changes in the thoracolumbar spine. IMPRESSION: No acute intra-abdominal or pelvic abnormality. Colonic diverticulosis Nonspecific bilateral inguinal adenopathy as on the prior exam Probable left simple renal  cyst. Electronically Signed   By: Prudencio Pair M.D.   On: 01/06/2019 20:17   Dg Chest 2 View  Result Date: 01/06/2019 CLINICAL DATA:  Nausea vomiting.  Weakness and shortness of breath. EXAM: CHEST - 2 VIEW COMPARISON:  10/17/2017 chest CT FINDINGS: Mild thoracic spondylosis. Cardiac and mediastinal margins appear normal. Lungs appear clear. No blunting of the costophrenic angles. IMPRESSION: 1.  No active cardiopulmonary disease is radiographically apparent. 2. Thoracic spondylosis. Electronically Signed   By: Van Clines M.D.   On: 01/06/2019 19:40   US Renal  Result Date: 01/06/2019 CLINICAL DATA:  Acute renal failure EXAM: RENAL / URINARY TRACT ULTRASOUND COMPLETE COMPARISON:  CT same day FINDINGS: Right Kidney: Renal measurements: 10.2 x 5.7 x 4.8 = volume: 148 mL. There is  a small anechoic simple cyst seen within the mid pole measuring 0.9 x 0.7 by 1.1 cm. No hydronephrosis or shadowing stone. Left Kidney: Renal measurements: 9.1 x 5.4 x 3.9 = volume: 99 mL. Echogenicity within normal limits. No mass or hydronephrosis visualized. Bladder: Appears normal for degree of bladder distention. IMPRESSION: No hydronephrosis or shadowing stones. Electronically Signed   By: Prudencio Pair M.D.   On: 01/06/2019 22:42        Scheduled Meds: . heparin  5,000 Units Subcutaneous Q8H  . insulin aspart  0-9 Units Subcutaneous TID WC  . metoprolol tartrate  50 mg Oral BID  . pantoprazole  40 mg Oral BID  . sodium chloride flush  3 mL Intravenous Q12H   Continuous Infusions: . lactated ringers 125 mL/hr at 01/08/19 0332     LOS: 2 days    Time spent: 25 minutes    Barb Merino, MD [facility] Pager [phone]  If 7PM-7AM, please contact night-coverage [email or web address] Password [email or web address] 01/08/2019, 2:12 PM

## 2019-01-09 ENCOUNTER — Telehealth: Payer: Self-pay

## 2019-01-09 DIAGNOSIS — N390 Urinary tract infection, site not specified: Secondary | ICD-10-CM

## 2019-01-09 DIAGNOSIS — N170 Acute kidney failure with tubular necrosis: Secondary | ICD-10-CM

## 2019-01-09 LAB — CBC WITH DIFFERENTIAL/PLATELET
Abs Immature Granulocytes: 0.02 10*3/uL (ref 0.00–0.07)
Basophils Absolute: 0.1 10*3/uL (ref 0.0–0.1)
Basophils Relative: 1 %
Eosinophils Absolute: 0.3 10*3/uL (ref 0.0–0.5)
Eosinophils Relative: 6 %
HCT: 25 % — ABNORMAL LOW (ref 36.0–46.0)
Hemoglobin: 7.5 g/dL — ABNORMAL LOW (ref 12.0–15.0)
Immature Granulocytes: 0 %
Lymphocytes Relative: 35 %
Lymphs Abs: 1.8 10*3/uL (ref 0.7–4.0)
MCH: 23.9 pg — ABNORMAL LOW (ref 26.0–34.0)
MCHC: 30 g/dL (ref 30.0–36.0)
MCV: 79.6 fL — ABNORMAL LOW (ref 80.0–100.0)
Monocytes Absolute: 0.5 10*3/uL (ref 0.1–1.0)
Monocytes Relative: 11 %
Neutro Abs: 2.4 10*3/uL (ref 1.7–7.7)
Neutrophils Relative %: 47 %
Platelets: 304 10*3/uL (ref 150–400)
RBC: 3.14 MIL/uL — ABNORMAL LOW (ref 3.87–5.11)
RDW: 18.2 % — ABNORMAL HIGH (ref 11.5–15.5)
WBC: 5 10*3/uL (ref 4.0–10.5)
nRBC: 0 % (ref 0.0–0.2)

## 2019-01-09 LAB — BASIC METABOLIC PANEL
Anion gap: 4 — ABNORMAL LOW (ref 5–15)
BUN: 46 mg/dL — ABNORMAL HIGH (ref 8–23)
CO2: 20 mmol/L — ABNORMAL LOW (ref 22–32)
Calcium: 8.2 mg/dL — ABNORMAL LOW (ref 8.9–10.3)
Chloride: 114 mmol/L — ABNORMAL HIGH (ref 98–111)
Creatinine, Ser: 1.48 mg/dL — ABNORMAL HIGH (ref 0.44–1.00)
GFR calc Af Amer: 41 mL/min — ABNORMAL LOW (ref >60)
GFR calc non Af Amer: 35 mL/min — ABNORMAL LOW (ref >60)
Glucose, Bld: 85 mg/dL (ref 70–99)
Potassium: 4.9 mmol/L (ref 3.5–5.1)
Sodium: 138 mmol/L (ref 135–145)

## 2019-01-09 LAB — UREA NITROGEN, URINE: Urea Nitrogen, Ur: 653 mg/dL

## 2019-01-09 LAB — GLUCOSE, CAPILLARY
Glucose-Capillary: 83 mg/dL (ref 70–99)
Glucose-Capillary: 100 mg/dL — ABNORMAL HIGH (ref 70–99)

## 2019-01-09 MED ORDER — SODIUM CHLORIDE 0.9 % IV SOLN
1.0000 g | INTRAVENOUS | Status: DC
  Administered 2019-01-09: 1 g via INTRAVENOUS
  Filled 2019-01-09: qty 1

## 2019-01-09 MED ORDER — CEPHALEXIN 500 MG PO CAPS: 500.0000 mg | ORAL_CAPSULE | Freq: Three times a day (TID) | ORAL | 0 refills | Status: AC

## 2019-01-09 NOTE — Telephone Encounter (Signed)
Copied from [place] [phone]. Topic: Appointment Scheduling - Scheduling Inquiry for Clinic >> Jan 09, 2019 10:55 AM Scherrie Gerlach wrote: Reason for CRM: Dr Laurena Bering calling from [facility] long and would like the pt to repeat a CMP and BMP in 2 days. Pt is being discharged today. Dr wants to make sure this is done. >> Jan 09, 2019 11:19 AM [place], Isidoro Donning wrote: Would you like an appointment scheduled or okay to order blood work?

## 2019-01-09 NOTE — Telephone Encounter (Signed)
Yeah, might as well since she will need to be in office okay for Wed/Thurs

## 2019-01-09 NOTE — Discharge Summary (Signed)
Physician Discharge Summary  Simonne Garfield [id number] DOB: Apr 01, 1946 DOA: 01/06/2019  PCP: Hoyt Koch, MD  Admit date: 01/06/2019 Discharge date: 01/09/2019  Admitted From: Home Disposition: Home  Recommendations for Outpatient Follow-up:  1. Follow up with PCP in 1-2 weeks 2. Please obtain BMP/CBC in 2 to 3 days.  3. Keep up your appointment with wound care center tomorrow.  Home Health: Not applicable Equipment/Devices: Not applicable  Discharge Condition: Stable CODE STATUS: Full code Diet recommendation: Diabetic diet  Brief/Interim Summary: 73 year old female with history of type 2 diabetes on metformin, hypertension, paroxysmal SVT, GERD and chronic lymphedema and open wounds of the lower extremities presented to the emergency room for evaluation of persistent nausea and abnormal labs as done in her primary care physician's office.  She was found to have significant acute renal failure and hyperkalemia and was sent to the ER.  Patient was treated for multiple medical problems as below.  Discharge Diagnoses:  Principal Problem:   Acute renal failure (ARF) (HCC) Active Problems:   HTN (hypertension)   DM type 2 (diabetes mellitus, type 2) (HCC)   Acute lower UTI   Paroxysmal SVT (supraventricular tachycardia) (HCC)   Lymphedema  Acute renal failure on chronic kidney disease stage I/stage II: Patient had recently developed nausea poor appetite and reflux symptoms and she continues to take losartan metformin and Lasix.  Had poor appetite probably related to her exacerbation of lymphedema and also had reflux symptoms. Presented with BUN/creatinine of 73/4.7--admitted to the hospital and treated aggressively with IV fluids with improvement, today BUN/creatinine is 46/1.48, potassium 4.9.  Urinating well.  No evidence of obstruction.  Baseline creatinine about 0.8-1. With adequate clinical improvement, pre-renal failure that has mostly improved, she is going home  today.  She will not take metformin, losartan, Lasix until her kidney functions normalized on follow-up evaluation.  I have called and is scheduled an appointment for her to have repeat blood test done in 3 days.  Type 2 diabetes: On metformin.  Hemoglobin A1c 5.9.  Her blood sugars are normal.  Will let her renal function normalized before resuming.  Acute UTI present on admission: She is growing more than 100,000 Klebsiella.  Similar episode last year needing hospitalization and treatment for UTI.  Previously pansensitive Klebsiella.  Given a dose of Rocephin in the hospital before discharge, will prescribe Keflex for 4 more days to finish total of 5 days of therapy for UTI.  Afebrile.  Minimal urinary symptoms.  Mild penicillin allergy, able to tolerate Rocephin in the hospital.  Nausea and vomiting, poor appetite: No definite cause found.  Probably due to reflux symptoms.  Recently started on Protonix that she will continue.  Her symptoms are mostly improved now.  Encourage plenty of liquids. Her blood pressures are usually low, 100-110.  She is not needing any additional blood pressure medication now.  She takes metoprolol for her SVTs that she will continue.  Patient has chronic lymphedema, bilateral leg with open wounds: She does have severe chronic lymphedema, was scheduled to see wound care.  Patient was seen by wound care nurse in the hospital, bilateral legs dressing done, Unna boot applied.  This will need very aggressive treatment.  She is a scheduled to follow-up at wound care clinic tomorrow morning, she needs to go home today to keep up with an appointment that is very important for her.  Patient was ambulated.  No drop in orthostatic, currently asymptomatic.  Blood pressures are adequate.  Renal functions appropriately improved.  She is willing to go home as she has to keep up the appointment for her wound care clinic that is important for her.  With adequate clinical improvement, will  let her go home.  I have called her primary care physician's office to schedule for repeat blood test.  She also has chronic macrocytic anemia and has remained at her baseline.  Discharge Instructions  Discharge Instructions    Call MD for:  redness, tenderness, or signs of infection (pain, swelling, redness, odor or green/yellow discharge around incision site)   Complete by: As directed    Call MD for:  severe uncontrolled pain   Complete by: As directed    Call MD for:  temperature >100.4   Complete by: As directed    Diet Carb Modified   Complete by: As directed    Discharge instructions   Complete by: As directed    Hold off on resuming your medication including metformin, losartan and water pill until you have repeat blood work done. Keep up with your follow-up at wound care clinic tomorrow morning. Drink plenty of water   Increase activity slowly   Complete by: As directed      Allergies as of 01/09/2019      Reactions   Oxycodone Hcl Nausea And Vomiting   Penicillins Itching   Has patient had a PCN reaction causing immediate rash, facial/tongue/throat swelling, SOB or lightheadedness with hypotension: No Has patient had a PCN reaction causing severe rash involving mucus membranes or skin necrosis: No Has patient had a PCN reaction that required hospitalization: No Has patient had a PCN reaction occurring within the last 10 years: No If all of the above answers are "NO", then may proceed with Cephalosporin use.      Medication List    STOP taking these medications   furosemide 40 MG tablet Commonly known as: LASIX   losartan 100 MG tablet Commonly known as: COZAAR   metFORMIN 500 MG tablet Commonly known as: GLUCOPHAGE     TAKE these medications   acetaminophen 325 MG tablet Commonly known as: TYLENOL Take 2 tablets (650 mg total) by mouth every 6 (six) hours as needed for mild pain (or Fever >/= 101).   aspirin EC 81 MG tablet Take 81 mg by mouth daily with  breakfast. Takes once or twice weekly   cephALEXin 500 MG capsule Commonly known as: KEFLEX Take 1 capsule (500 mg total) by mouth 3 (three) times daily for 4 days. Start taking on: January 10, 2019   [employer] Antibiotic [phone] Oint APPLY AFTER CLEANING BEFORE WRAPPING LGS What changed: See the new instructions.   doxycycline 100 MG tablet Commonly known as: VIBRA-TABS Take 100 mg by mouth 2 (two) times daily.   methocarbamol 500 MG tablet Commonly known as: ROBAXIN Take 1 tablet (500 mg total) by mouth every 6 (six) hours as needed for muscle spasms.   metoprolol tartrate 50 MG tablet Commonly known as: LOPRESSOR TAKE 1 TABLET BY MOUTH TWICE A DAY   ondansetron 4 MG tablet Commonly known as: ZOFRAN Take 1 tablet (4 mg total) by mouth every 8 (eight) hours as needed for nausea or vomiting.   pantoprazole 40 MG tablet Commonly known as: PROTONIX Take 1 tablet (40 mg total) by mouth daily.   polyethylene glycol 17 g packet Commonly known as: MIRALAX / GLYCOLAX Take 17 g by mouth daily.       Allergies  Allergen Reactions  . Oxycodone Hcl Nausea And Vomiting  .  Penicillins Itching    Has patient had a PCN reaction causing immediate rash, facial/tongue/throat swelling, SOB or lightheadedness with hypotension: No Has patient had a PCN reaction causing severe rash involving mucus membranes or skin necrosis: No Has patient had a PCN reaction that required hospitalization: No Has patient had a PCN reaction occurring within the last 10 years: No If all of the above answers are "NO", then may proceed with Cephalosporin use.    Consultations:  Wound care    Procedures/Studies: Ct Abdomen Pelvis Wo Contrast  Result Date: 01/06/2019 CLINICAL DATA:  Nausea, vomiting EXAM: CT ABDOMEN AND PELVIS WITHOUT CONTRAST TECHNIQUE: Multidetector CT imaging of the abdomen and pelvis was performed following the standard protocol without IV contrast. COMPARISON:  October 16, 2017 FINDINGS:  Lower chest: The visualized heart size within normal limits. No pericardial fluid/thickening. No hiatal hernia. The visualized portions of the lungs are clear. Hepatobiliary: Although limited due to the lack of intravenous contrast, normal in appearance without gross focal abnormality. No evidence of calcified gallstones or biliary ductal dilatation. Pancreas:  Unremarkable.  No surrounding inflammatory changes. Spleen: Normal in size. Although limited due to the lack of intravenous contrast, normal in appearance. Adrenals/Urinary Tract: Both adrenal glands appear normal. Again noted is a low-density lesion in the upper pole the left kidney measuring 2.5 cm. The right kidney is unremarkable. No renal, collecting system, or bladder calculi are seen. Stomach/Bowel: The stomach and small bowel are unremarkable. Scattered colonic diverticula are noted without diverticulitis. The appendix is unremarkable. Vascular/Lymphatic: There is scattered bilateral inguinal lymph nodes, as on the prior exam, the largest measuring 1.5 cm in the right inguinal region. No definite intra-abdominal adenopathy seen. Reproductive: The uterus and adnexa are unremarkable. Other: No evidence of abdominal wall mass or hernia. Musculoskeletal: No acute or significant osseous findings. There is diffuse osteopenia and bilateral hip osteoarthritis. Degenerative changes in the thoracolumbar spine. IMPRESSION: No acute intra-abdominal or pelvic abnormality. Colonic diverticulosis Nonspecific bilateral inguinal adenopathy as on the prior exam Probable left simple renal cyst. Electronically Signed   By: Prudencio Pair M.D.   On: 01/06/2019 20:17   Dg Chest 2 View  Result Date: 01/06/2019 CLINICAL DATA:  Nausea vomiting.  Weakness and shortness of breath. EXAM: CHEST - 2 VIEW COMPARISON:  10/17/2017 chest CT FINDINGS: Mild thoracic spondylosis. Cardiac and mediastinal margins appear normal. Lungs appear clear. No blunting of the costophrenic angles.  IMPRESSION: 1.  No active cardiopulmonary disease is radiographically apparent. 2. Thoracic spondylosis. Electronically Signed   By: Van Clines M.D.   On: 01/06/2019 19:40   US Renal  Result Date: 01/06/2019 CLINICAL DATA:  Acute renal failure EXAM: RENAL / URINARY TRACT ULTRASOUND COMPLETE COMPARISON:  CT same day FINDINGS: Right Kidney: Renal measurements: 10.2 x 5.7 x 4.8 = volume: 148 mL. There is a small anechoic simple cyst seen within the mid pole measuring 0.9 x 0.7 by 1.1 cm. No hydronephrosis or shadowing stone. Left Kidney: Renal measurements: 9.1 x 5.4 x 3.9 = volume: 99 mL. Echogenicity within normal limits. No mass or hydronephrosis visualized. Bladder: Appears normal for degree of bladder distention. IMPRESSION: No hydronephrosis or shadowing stones. Electronically Signed   By: Prudencio Pair M.D.   On: 01/06/2019 22:42     Subjective: Patient was seen and examined.  No overnight events.  Afebrile.  Blood pressures 95-101.  No orthostatic or dizziness.  Able to walk in the hallway with her on wheeled walker.   Discharge Exam: Vitals:   01/09/19  0452 01/09/19 1013  BP: (!) 101/58 (!) 101/52  Pulse: 62 67  Resp: 16   Temp: 98.5 F (36.9 C)   SpO2: 98%    Vitals:   01/08/19 2040 01/08/19 2258 01/09/19 0452 01/09/19 1013  BP: (!) 95/46 (!) 95/51 (!) 101/58 (!) 101/52  Pulse: 63 65 62 67  Resp: 18 16 16    Temp: (!) 97.4 F (36.3 C) (!) 97.5 F (36.4 C) 98.5 F (36.9 C)   TempSrc: Oral Oral Oral   SpO2: 99% 100% 98%   Weight:   121.6 kg   Height:        General: Pt is alert, awake, not in acute distress, on room air. Cardiovascular: RRR, S1/S2 +, no rubs, no gallops Respiratory: CTA bilaterally, no wheezing, no rhonchi Abdominal: Soft, NT, ND, bowel sounds + Extremities:  Extensive bilateral lymphedema and open wounds, pictures available in the media section. Currently dressing applied with Unna boot.    The results of significant diagnostics from this  hospitalization (including imaging, microbiology, ancillary and laboratory) are listed below for reference.     Microbiology: Recent Results (from the past 240 hour(s))  SARS Coronavirus 2 [facility] order, Performed in [facility] hospital lab) Nasopharyngeal Nasopharyngeal Swab     Status: None   Collection Time: 01/06/19  9:41 PM   Specimen: Nasopharyngeal Swab  Result Value Ref Range Status   SARS Coronavirus 2 NEGATIVE NEGATIVE Final    Comment: (NOTE) If result is NEGATIVE SARS-CoV-2 target nucleic acids are NOT DETECTED. The SARS-CoV-2 RNA is generally detectable in upper and lower  respiratory specimens during the acute phase of infection. The lowest  concentration of SARS-CoV-2 viral copies this assay can detect is 250  copies / mL. A negative result does not preclude SARS-CoV-2 infection  and should not be used as the sole basis for treatment or other  patient management decisions.  A negative result may occur with  improper specimen collection / handling, submission of specimen other  than nasopharyngeal swab, presence of viral mutation(s) within the  areas targeted by this assay, and inadequate number of viral copies  (<250 copies / mL). A negative result must be combined with clinical  observations, patient history, and epidemiological information. If result is POSITIVE SARS-CoV-2 target nucleic acids are DETECTED. The SARS-CoV-2 RNA is generally detectable in upper and lower  respiratory specimens dur ing the acute phase of infection.  Positive  results are indicative of active infection with SARS-CoV-2.  Clinical  correlation with patient history and other diagnostic information is  necessary to determine patient infection status.  Positive results do  not rule out bacterial infection or co-infection with other viruses. If result is PRESUMPTIVE POSTIVE SARS-CoV-2 nucleic acids MAY BE PRESENT.   A presumptive positive result was obtained on the submitted specimen  and  confirmed on repeat testing.  While 2019 novel coronavirus  (SARS-CoV-2) nucleic acids may be present in the submitted sample  additional confirmatory testing may be necessary for epidemiological  and / or clinical management purposes  to differentiate between  SARS-CoV-2 and other Sarbecovirus currently known to infect humans.  If clinically indicated additional testing with an alternate test  methodology [phone]) is advised. The SARS-CoV-2 RNA is generally  detectable in upper and lower respiratory sp ecimens during the acute  phase of infection. The expected result is Negative. Fact Sheet for Patients:  StrictlyIdeas.no Fact Sheet for Healthcare Providers: BankingDealers.co.za This test is not yet approved or cleared by the [place] FDA  and has been authorized for detection and/or diagnosis of SARS-CoV-2 by FDA under an Emergency Use Authorization (EUA).  This EUA will remain in effect (meaning this test can be used) for the duration of the COVID-19 declaration under Section 564(b)(1) of the Act, 21 U.S.C. section 360bbb-3(b)(1), unless the authorization is terminated or revoked sooner. Performed at [facility], [place] [address], [place], [place] [address]   Culture, Urine     Status: Abnormal (Preliminary result)   Collection Time: 01/07/19 11:39 PM   Specimen: Urine, Clean Catch  Result Value Ref Range Status   Specimen Description   Final    URINE, CLEAN CATCH Performed at [facility], [place] [address] [place], [place] [address]    Special Requests   Final    NONE Performed at [facility], [place] [address] [place], [place] [address]    Culture >=100,000 COLONIES/mL KLEBSIELLA PNEUMONIAE (A)  Final   Report Status PENDING  Incomplete     Labs: BNP (last 3 results) No results for input(s): BNP in the last 8760 hours. Basic Metabolic Panel: Recent Labs  Lab  01/06/19 0919 01/06/19 2023 01/07/19 0438 01/08/19 0355 01/09/19 0456  NA 136 135 138 138 138  K 5.7* 6.0* 5.2* 5.0 4.9  CL 105 110 113* 115* 114*  CO2 16* 15* 18* 19* 20*  GLUCOSE 110* 102* 79 84 85  BUN 67* 73* 65* 57* 46*  CREATININE 4.44* 4.73* 3.85* 2.33* 1.48*  CALCIUM 9.7 8.6* 8.4* 8.2* 8.2*  MG  --   --   --  2.3  --    Liver Function Tests: Recent Labs  Lab 01/06/19 0919  AST 17  ALT 11  ALKPHOS 71  BILITOT 0.4  PROT 8.4*  ALBUMIN 3.6   Recent Labs  Lab 01/06/19 0919  LIPASE 27.0   No results for input(s): AMMONIA in the last 168 hours. CBC: Recent Labs  Lab 01/06/19 0919 01/06/19 1832 01/07/19 0438 01/08/19 0355 01/09/19 0456  WBC 9.2 6.5 6.7 5.5 5.0  NEUTROABS  --   --   --  2.7 2.4  HGB 10.6* 11.7* 8.2* 8.0* 7.5*  HCT 32.8* 37.6 27.5* 26.7* 25.0*  MCV 75.4* 77.5* 79.5* 79.7* 79.6*  PLT 469.0* 355 383 338 304   Cardiac Enzymes: No results for input(s): CKTOTAL, CKMB, CKMBINDEX, TROPONINI in the last 168 hours. BNP: Invalid input(s): POCBNP CBG: Recent Labs  Lab 01/08/19 1119 01/08/19 1642 01/08/19 2151 01/09/19 0754 01/09/19 1117  GLUCAP 111* 120* 109* 83 100*   D-Dimer No results for input(s): DDIMER in the last 72 hours. Hgb A1c Recent Labs    01/07/19 0438  HGBA1C 5.8*   Lipid Profile No results for input(s): CHOL, HDL, LDLCALC, TRIG, CHOLHDL, LDLDIRECT in the last 72 hours. Thyroid function studies No results for input(s): TSH, T4TOTAL, T3FREE, THYROIDAB in the last 72 hours.  Invalid input(s): FREET3 Anemia work up Recent Labs    01/07/19 0438  VITAMINB12 1,382*  FERRITIN 30   Urinalysis    Component Value Date/Time   COLORURINE YELLOW 01/06/2019 1712   APPEARANCEUR CLOUDY (A) 01/06/2019 1712   LABSPEC 1.016 01/06/2019 1712   PHURINE 5.0 01/06/2019 1712   GLUCOSEU NEGATIVE 01/06/2019 1712   HGBUR LARGE (A) 01/06/2019 1712   BILIRUBINUR NEGATIVE 01/06/2019 1712   BILIRUBINUR Neg 10/22/2017 [place] 01/06/2019 1712   PROTEINUR NEGATIVE 01/06/2019 1712   UROBILINOGEN 0.2 10/22/2017 0950   UROBILINOGEN 1.0 12/09/2013 2303  NITRITE NEGATIVE 01/06/2019 1712   LEUKOCYTESUR LARGE (A) 01/06/2019 1712   Sepsis Labs Invalid input(s): PROCALCITONIN,  WBC,  LACTICIDVEN Microbiology Recent Results (from the past 240 hour(s))  SARS Coronavirus 2 [facility] order, Performed in [facility] hospital lab) Nasopharyngeal Nasopharyngeal Swab     Status: None   Collection Time: 01/06/19  9:41 PM   Specimen: Nasopharyngeal Swab  Result Value Ref Range Status   SARS Coronavirus 2 NEGATIVE NEGATIVE Final    Comment: (NOTE) If result is NEGATIVE SARS-CoV-2 target nucleic acids are NOT DETECTED. The SARS-CoV-2 RNA is generally detectable in upper and lower  respiratory specimens during the acute phase of infection. The lowest  concentration of SARS-CoV-2 viral copies this assay can detect is 250  copies / mL. A negative result does not preclude SARS-CoV-2 infection  and should not be used as the sole basis for treatment or other  patient management decisions.  A negative result may occur with  improper specimen collection / handling, submission of specimen other  than nasopharyngeal swab, presence of viral mutation(s) within the  areas targeted by this assay, and inadequate number of viral copies  (<250 copies / mL). A negative result must be combined with clinical  observations, patient history, and epidemiological information. If result is POSITIVE SARS-CoV-2 target nucleic acids are DETECTED. The SARS-CoV-2 RNA is generally detectable in upper and lower  respiratory specimens dur ing the acute phase of infection.  Positive  results are indicative of active infection with SARS-CoV-2.  Clinical  correlation with patient history and other diagnostic information is  necessary to determine patient infection status.  Positive results do  not rule out bacterial infection or co-infection with  other viruses. If result is PRESUMPTIVE POSTIVE SARS-CoV-2 nucleic acids MAY BE PRESENT.   A presumptive positive result was obtained on the submitted specimen  and confirmed on repeat testing.  While 2019 novel coronavirus  (SARS-CoV-2) nucleic acids may be present in the submitted sample  additional confirmatory testing may be necessary for epidemiological  and / or clinical management purposes  to differentiate between  SARS-CoV-2 and other Sarbecovirus currently known to infect humans.  If clinically indicated additional testing with an alternate test  methodology [phone] is advised. The SARS-CoV-2 RNA is generally  detectable in upper and lower respiratory sp ecimens during the acute  phase of infection. The expected result is Negative. Fact Sheet for Patients:  StrictlyIdeas.no Fact Sheet for Healthcare Providers: BankingDealers.co.za This test is not yet approved or cleared by the [place] FDA and has been authorized for detection and/or diagnosis of SARS-CoV-2 by FDA under an Emergency Use Authorization (EUA).  This EUA will remain in effect (meaning this test can be used) for the duration of the COVID-19 declaration under Section 564(b)(1) of the Act, 21 U.S.C. section 360bbb-3(b)(1), unless the authorization is terminated or revoked sooner. Performed at [facility], [place] [address], [place], [place] [address]   Culture, Urine     Status: Abnormal (Preliminary result)   Collection Time: 01/07/19 11:39 PM   Specimen: Urine, Clean Catch  Result Value Ref Range Status   Specimen Description   Final    URINE, CLEAN CATCH Performed at [facility], [place] [address], [place], [place] [address]    Special Requests   Final    NONE Performed at [facility], [place] [address] [place], [place] [address]    Culture >=100,000 COLONIES/mL KLEBSIELLA PNEUMONIAE (A)  Final    Report Status  PENDING  Incomplete     Time coordinating discharge:  40 minutes  SIGNED:   Barb Merino, MD  [facility] 01/09/2019, 11:19 AM

## 2019-01-09 NOTE — Telephone Encounter (Signed)
Left message for patient to call back to schedule.  °

## 2019-01-09 NOTE — Care Management Important Message (Signed)
Important Message  Patient Details IM Letter given to Dessa Phi RN to present to the Patient Name: [name] [name] MRN: [id number] Date of Birth: 07-Mar-1946   Medicare Important Message Given:  Yes     Kerin Salen 01/09/2019, 11:10 AM

## 2019-01-10 ENCOUNTER — Encounter (HOSPITAL_BASED_OUTPATIENT_CLINIC_OR_DEPARTMENT_OTHER): Payer: PPO | Attending: Internal Medicine | Admitting: Internal Medicine

## 2019-01-10 ENCOUNTER — Other Ambulatory Visit: Payer: Self-pay

## 2019-01-10 ENCOUNTER — Telehealth: Payer: Self-pay | Admitting: *Deleted

## 2019-01-10 DIAGNOSIS — L97812 Non-pressure chronic ulcer of other part of right lower leg with fat layer exposed: Secondary | ICD-10-CM | POA: Diagnosis not present

## 2019-01-10 DIAGNOSIS — L97222 Non-pressure chronic ulcer of left calf with fat layer exposed: Secondary | ICD-10-CM | POA: Diagnosis not present

## 2019-01-10 DIAGNOSIS — L97212 Non-pressure chronic ulcer of right calf with fat layer exposed: Secondary | ICD-10-CM | POA: Insufficient documentation

## 2019-01-10 DIAGNOSIS — E11622 Type 2 diabetes mellitus with other skin ulcer: Secondary | ICD-10-CM | POA: Insufficient documentation

## 2019-01-10 DIAGNOSIS — L97822 Non-pressure chronic ulcer of other part of left lower leg with fat layer exposed: Secondary | ICD-10-CM | POA: Diagnosis not present

## 2019-01-10 DIAGNOSIS — I110 Hypertensive heart disease with heart failure: Secondary | ICD-10-CM | POA: Diagnosis not present

## 2019-01-10 DIAGNOSIS — E1140 Type 2 diabetes mellitus with diabetic neuropathy, unspecified: Secondary | ICD-10-CM | POA: Insufficient documentation

## 2019-01-10 DIAGNOSIS — I890 Lymphedema, not elsewhere classified: Secondary | ICD-10-CM | POA: Diagnosis not present

## 2019-01-10 DIAGNOSIS — I509 Heart failure, unspecified: Secondary | ICD-10-CM | POA: Insufficient documentation

## 2019-01-10 DIAGNOSIS — N390 Urinary tract infection, site not specified: Secondary | ICD-10-CM | POA: Diagnosis not present

## 2019-01-10 LAB — URINE CULTURE: Culture: >=100000 — AB

## 2019-01-10 NOTE — Progress Notes (Signed)
ALTHEIA, SHADWELL ([id number]) Visit Report for 01/10/2019 Chief Complaint Document Details Patient Name: Date of Service: [name] [name], [name] [name] 01/10/2019 9:00 AM Medical Record [id number] Patient Account Number: [id number] Date of Birth/Sex: Treating RN: 03-18-1946 (73 y.o. [name] [name] Primary Care Provider: [name] Other Clinician: Referring Provider: Treating Provider/Extender:Lino Wickliff, Tenna Child, [place] Siren in Treatment: 0 Information Obtained from: Patient Chief Complaint Patient comes back in today for review of wounds on her bilateral lower extremities in the setting of lymphedema and chronic venous insufficiency 11/23/17; patient comes in today for recurrent wounds on her bilateral lower extremities in the setting of lymphedema and chronic venous insufficiency. 01/10/2019; patient returns to clinic for wounds on her bilateral lower extremities in the setting of lymphedema and chronic venous insufficiency Electronic Signature(s) Signed: 01/10/2019 5:29:39 PM By: Linton Ham MD Entered By: Linton Ham on 01/10/2019 10:05:52 -------------------------------------------------------------------------------- HPI Details Patient Name: Date of Service: [name] [name], [name] [name] 01/10/2019 9:00 AM Medical Record [id number] Patient Account Number: [id number] Date of Birth/Sex: Treating RN: 08/23/1945 (73 y.o. [name] [name] Primary Care Provider: [name] Other Clinician: Referring Provider: Treating Provider/Extender:Reia Viernes, Tenna Child, [place] Siren in Treatment: 0 History of Present Illness HPI Description: 04/09/15; the patient is here for review of wounds on her bilateral lower extremities for the last month. The patient has a history of lymphedema and bilateral chronic venous insufficiency with inflammation she was here for review of wounds on her bilateral legs in 2014 she was discharged home with external compression pumps which  she is currently not using. Over the last month she developed wounds on her bilateral lower legs with drainage and pain and she is here for our review of this. 04/16/15; the patient's edema is under much better control. She has 3 areas one on the right anterior medial leg one on the left posterior leg and a small open area with purulent drainage on the left anterior leg. I have cultured the area on the left anterior leg 04/23/15; continued improvement in the patient's edema. All her wounds of closed I see no open areas. Culture from the left anterior leg last time was negative. READMISSION 02/07/16; this patient is a woman that I saw her earlier in 2017. I believe we discharged her very quickly with bilateral wounds in her legs secondary to chronic venous hypertension with inflammation and secondary lymphedema. She had juxtalite stockings. She has external compression pumps at home from a stay in our clinic in 2014. She tells me that she's had wounds on her bilateral lower legs since March or April of this year. 3 wounds on the left 2 on the right. She is a type II diabetic and has a history of noncompressible vessels bilaterally although she is tolerated 4-layer compression. The last note from her primary physician Dr. [name] was in May. At that point Dr. [name] had referred the patient here for lymphedema management. The patient stated she made a phone call to year but was not able to arrange an appointment. It is not clear that she is actually using anything on the wounds currently except some gauze that was saturated with drainage per our intake nurse. She is certainly not using her juxtalite stockings and by the patient's own admission she is not using her compression pumps because they are "irritating". I don't see a recent hemoglobin A1c. The patient states she has not been systemically unwell but the legs are painful 02/14/16; patient tolerated her Profore wraps reasonably  well. She is using the compression pumps 1 time per  day. Measurements of her legs are better in terms of the amount of lymphedema 02/21/16; patient tolerated her Profore wraps it. She says she is using her compression pumps one or 2 times a day. Measurements of her leg circumference are improved and most of her wound dimensions are better, unfortunately the home health that we referred her to did not except her insurance but works successful in getting her accompanied that would except her insurance however she apparently has a $25 co-pay which she cannot afford as she is on a fixed income 03/05/16; patient states she did not back using the pumps. She is healed that 2 wounds on the left leg. She still has areas on the right lateral lower leg, right medial lower leg and left lateral lower leg. All of these look some better and have reduced in area 03/13/16; still 3 wounds 1 on the left lateral leg, one on the left medial/posterior leg which is really the most extensive and a small area on the right lateral leg. All of this in the setting of severe chronic lymphedema and chronic venous inflammation. Damage to the skin with cobblestone thickened skin. 03/20/16; still 2 areas. The area on the left lateral leg appears to of closed over. The right medial leg is still most extensive wound although it appears to be closing over right lateral only a small open area remains. Using silver alginate Profore wraps 03/27/16; the area on the left leg remains closed. The edema control is better on the left than the right. The right medial leg is still weeping but everything appears to be epithelialized. The lateral aspect of the right leg appears closed. She tells me she has old juxtalite stockings. She has been compliant with her compression pumps. 04/03/16; the area on the left leg remains closed. She has ordered new juxtalite stockings. The right posterior medial wound is fully epithelialized. However she has  an irritated area over the right Achilles that she states is been itching all week under the wraps.. She has been compliant with her compression pumps 04/10/16; the area on the left leg remains closed. The right posterior medial wound is also closed today. The irritated area over the right Achilles which was probably wrap injury is also fully epithelialized and closed. The patient has not received her new juxtalite stockings, she arrives in clinic today with an old juxtalite stocking in place. Sheis using her compression pumps secondary to her severe stage III lymphedema READMISSION 01/21/17; this is a patient we know from several previous stays in this clinic. She has lymphedema with chronic venous insufficiency and inflammation. The last time she was here we discharged her with bilateral juxta lites which she is compliant with. She also has external compression pumps at home from stays in our clinic in 2014 although she tells me that she is not very compliant with these as when she uses them a causes pain in her bilateral knees the patient states that her wounds on her left greater than right leg including 2 large areas on the left anterior and left lateral leg and an area on the right leg open in late June or July since then she is been followed by [facility] and she's had bilateral Unna boot supplied every week for the last month.. I think the wounds have actually improved although they are not specifically dressed. She states she had an ultrasound to rule out DVT bilaterally that was negative. She does not have arterial studies although she is a  diabetic. She states she has a lot of pain in her legs she states she does not use her external compression pumps because it causes pain in her knees. As usual her ABIs were noncompressible bilaterally in this clinic 01/28/17; currently the patient only has one small wound on the left lateral leg and I think this should be healed next week. We are  going to try to get her bilateral extremitease stockings. She tells me that compression pumps heard her posterior rib arthritic knees so she is not been compliant with this 02/04/17; the patient's wounds are totally healed. She has chronic venous insufficiency and secondary lymphedema. She has new extremitease stockings. She also has compression pumps Readmission: 04/21/17 on evaluation today patient appears to be doing about the same as what she was previous when we were evaluating her back in November 2018. At that point she had completely healed. With that being said she has now reopened and states that she wasn't aware that she was supposed to come back. 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 Subsequently she also states that although she has the compression that we got for her previously the extremitease stockings she nonetheless doesn't always wear these and being noncompliant often has things will reopen as far as ulcers on her lower extremities. No fevers, chills, nausea, or vomiting noted at this time. Patient has no evidence of dementia. She is having some discomfort in regard to the ulcers at this point. 04/28/17 on evaluation today patient appears to be doing significantly better in regard to her wounds. With that being said she is still having significant discomfort and is not really keen on sharp debridement. She does not appear to have any evidence of infection which is good news I do think the Iodoflex is beneficial for her. Of note her ABI's were found in the system to be on the right 1.21 with a TBI of 0.76 and on the left 1.35 with a TBI of 0.75. In general her swelling appears to be significantly improved however. No fevers, chills, nausea, or vomiting noted at this time. Patient has been tolerating the wraps without complication. She has no evidence of  dementia. 05/05/17-she is here in follow-up evaluation for bilateral lower extremity venous and lymphedema ulcers. She states she has not been using her lymphedema pumps secondary to pain at her knees. After discussion it was noted that she uses knee-high lymphedema pumps but does have a pair of thigh-high lymphedema pumps. She was strongly advised to use the thigh-high lymphedema pumps and if necessary reduce the setting for improved tolerance. She reports no change in odor and/or drainage. She continues to tolerate sharp debridement poorly with significant pain, primarily to the right lower extremity. We will continue with current treatment plan, along with improved lymphedema pump use and follow-up next week 05/12/17 on evaluation today patient actually appears to be doing much better in regard to her bilateral lower extremity ulcers. Only the right altar actually requires debridement today. The other two cleaned off nicely in two of the three in fact on the left lower extremity or actually almost completely healed. She does have some weeping noted still the bilateral lower extremities left greater than right. She still is not able to use her compression pumps even though I have mentioned this several times to her as did Leah last week. No fevers, chills, nausea, or vomiting noted at  this time. Patient has been tolerating the dressing changes without complication. Patient has no evidence of dementia 05/19/17 on evaluation today patient appears to be doing very well regard to her bilateral lower extremity ulcers. In fact she seems to be improving and in fact healed in a couple of locations that were giving her trouble last week. Overall I'm extremely pleased with how things have progressed. She still has some discomfort especially in the right lateral lower extremity. Fortunately there does not appear to be any evidence of significant infection which is good news she does tell me that she has used  her compression pumps several times although not routinely over the past week I did praise her for this. I do think it will be beneficial and that might even be what's helping with her wounds as far as healing is concerned at least to a degree. No fevers, chills, nausea, or vomiting noted at this time. Patient has no dementia and no evidence of infection. 05/26/17 on evaluation today patient presents for evaluation concerning her bilateral lower extremity ulcer secondary to lymphedema. Fortunately she appears to be doing very well at this point in time. Specifically her right leg appears to be completely closed and healed. The left leg though there is still a small area of opening overall has healed. She does have a little bit of excoriation at the top or the wraps are because they slid down on her. Otherwise there's no evidence of infection and the other has improved dramatically now that she is not draining as significantly. Patient is having no significant pain she tells me she has been able to use her lymphedema pumps one time a day although that's not all she can tolerate. Even that is difficult for her. 06/02/17 on evaluation today patient's ulcers appear to be completely closed at this point. She does still note some odor but I believe this is due to the fact she has not been able to wash her legs as well currently. Fortunately she did bring her compression with her today. She does have the EXTREMIT-EASE Compression. With that being said she is somewhat nervous about going back to this as previously she broke out yet again once we discontinue wrapping her. READMISSION 11/23/17; patient is now 73 year old diabetic woman with severe chronic venous insufficiency with lymphedema. We care for her last in this clinic in February 2019 at which time she had chronic venous insufficiency with lymphedema bilateral lower extremity ulcers. We discharged her with bilateral wrap around/extremitease stockings  and compression pumps. Is fairly clear she is not using the compression pumps stating it causes pain in her knees. In any case she was admitted to hospital from 8/3 through 8/6 with a several week history of recurrent bilateral ulcers. She was felt to have bilateral cellulitis treated with doxycycline IV. She is still on oral doxycycline and has another 2 days worth. At discharge her white count was 14.1 hemoglobin at 8.9. The patient is a diabetic however her last ABIs in 2018 showed a right ABI of 1.21, left of 1.35. TBI's were 0.76 on the right 0.75 on the left. Posterior tibial artery was monophasic on the right triphasic on the left. Dorsalis pedis triphasic on the right and triphasic on the left. She is not felt to have significant macrovascular disease 12/02/17;significant bilateral lower extremity wounds secondary to severe uncontrolled lymphedema. She did not get home health out to change the wraps. So she kept this on all week. She is telling me she  using his her compression pumps once a week. In general all the wounds look better. We've been using silver alginate with secondary absorptive dressings 4 layer compression 12/10/17; bilateral lower extremity wounds secondary to uncontrolled lymphedema. She tells me she is using her compression pumps on most days. We've been keeping wraps on all week. Using silver alginate. She has wounds on the right lateral calf left medial and left lateral calf. All of this is somewhat better 12/16/17; the patient has 3 small open areas. One on the right lateral calf, one on the left medial calf and the small area on the left lateral calf. She has lymphedema, chronic venous insufficiency with hemosiderin deposition. Very fibrotic distal lower extremity scan with suggestion of an inverted bottle sign appearance to her legs. She has compression pumps but she does not use them has not used them in the last week. Currently we have her in 4 layer compression,  silver alginate to the wounds and this is being changed once by home health. She is noncompliant with the compression pumps because she says it hurts her knees. She also has compression stockings ando Juxta light stockings at home 12/24/17; the patient's right lateral calf is healed as well as the left medial. There is no open wound on the right leg. She still has 2 areas on the left lateral calf. We transitioned the right leg into an extremitease stocking which she brought in from home. 12/31/17; the patient has a superficial wound on the left lateral lower calf. We've been wrapping this leg. She arrives today with the right leg swollen but without a wound. She cannot get her extremities stocking on the right leg. There is a lot more edema here. She is not using her compression pumps 01/10/2018; patient has a same superficial wound on the left lateral calf. Her right leg has less peripheral edema. She tells me she also started herself on some Lasix that had been previously prescribed for her at some point but she had never taken it. She is also concerned about whether her compression pumps are leaking. She has extremitease stockings however the edema gets bad enough she can get these on. Currently she has no open wound on the right leg and is mention the edema is actually better than when I saw this 10 days ago 01/17/2018; patient has a same superficial area on the posterior left calf and a weeping area in the mid part of the right lateral calf. She still does not have anyone from [facility] coming to see her compression pumps which she describes as being suspicious for a leakage. I am not really sure how frequently she is using this in any case. She has massive bilateral calf lymphedema. I think there is some spreading lymphedema into her posterior thighs. 01/24/2018; the patient's edema in her legs is actually some better. She still has a smaller area on the left posterior calf although  this is better. The weeping area on the right lateral calf is also better. As it turned out she did not get her pumps from [facility] but [facility] and they are going to try to go out and see these this week which is going to help. I explained to her that she will use the compression pumps once a day while we are wrapping her legs but when she transitions to stockings that may be she will require pumping twice a day. Her compliance in this area has not been high and I wonder about  her ability to do this herself. 02/01/2018; the area on the posterior left leg is healed. She still has the original wound on the right lateral calf which is epithelialized. The however just above this there is still weeping lymphedema fluid. She has a new area on the upper right calf which is either wrap injury or is she points out where she has been scratching under the wraps. We still not have managed to get a hold of a representative of [facility] to see if the pump is operational. She has extremitease stockings at home however I am not of the opinion that this will maintain her skin integrity without the compression pumps. 02/08/2018; posterior left leg remains healed although she has a new small open area on the left lateral calf. Right lateral calf has two quarter sized open areas and close juxtaposition. There is less weeping edema fluid. She apparently managed to contact [facility]. They are sending her a part. But she is still not received it 02/15/2018; the patient has no open wound on either leg. Some scale present on the right upper lateral calf just below the fibular head. Edema control is excellent. She did not bring her stockings or her juxta lite wrap around. She has the part for her compression pump but she has not use the compression pumps yet READMISSION 01/10/2019 Patient is now a 73 year old woman that we have had in the clinic multiple times in the past. She has chronic  lower extremity lymphedema, recurrent wounds on her bilateral lower extremities. She is also a type II diabetic reasonably well controlled with a recent hemoglobin A1c while she was in the hospital of 5.9. She has wounds on her bilateral lower extremities mostly on the left lateral and right lateral. These were noted also during her recent hospitalization. The patient states they have been there for a while. She has not been able to wear her stockings he has trouble getting them on as she lives alone. She also has external compression pumps but uses them sparingly because it causes pain in her knees which hurt because of osteoarthritis. The patient was on doxycycline before she went into the hospital apparently for fear of infection in her legs and is on Keflex coming out because of a UTI The patient was recently in the hospital from 01/06/2019 through 01/09/2019. She was admitted with nausea and vomiting and prerenal azotemia. This was corrected with IV fluid her Lasix has been put on hold. Hemoglobin A1c at 5.9 her metformin was discontinued. She was also felt to have a UTI she was prescribed Keflex at 503 times a day she is picking that up today. The patient is not felt to have an arterial issue. ABIs in our clinic were 1.24 on the right and 1.06 on the left. She had formal studies in 2018 at which time her ABIs were 1.21 on the right TBI of 0.76 and on the left 1.35 and 0.75 respectively. Waveforms are on the right were monophasic and biphasic, triphasic and biphasic on the left. She is tolerated 4 layer compression in the past Electronic Signature(s) Signed: 01/10/2019 5:29:39 PM By: Linton Ham MD Entered By: Linton Ham on 01/10/2019 10:10:58 -------------------------------------------------------------------------------- Physical Exam Details Patient Name: Date of Service: [name] [name], [name] [name] 01/10/2019 9:00 AM Medical Record [id number] Patient Account Number: [id number] Date  of Birth/Sex: Treating RN: [birth year] (73 y.o. [name] [name] Primary Care Provider: [name] Other Clinician: Referring Provider: Treating Provider/Extender:Fermon Ureta, Tenna Child, [place] Siren in Treatment: 0 Constitutional Respirations  regular, non-labored and within target range.. Temperature is normal and within the target range for the patient.Marland Kitchen Appears in no distress. Eyes Conjunctivae clear. No discharge.no icterus. Respiratory work of breathing is normal. Bilateral breath sounds are clear and equal in all lobes with no wheezes, rales or rhonchi.. Cardiovascular Somewhat irregular pulse however she is not tachycardic. She appears to be euvolemic. 2 out of 6 pansystolic murmur at the lower left sternal border sounds like TR. Pedal pulses palpable bilaterally in her feet. Very poor edema control with nonpitting edema bilaterally. Lymphatic None palpable in the popliteal area bilaterally. Integumentary (Hair, Skin) Severe chronic hemosiderin deposition in the bilateral lower extremities. No current evidence of cellulitis. Psychiatric appears at normal baseline. Notes Wound exam; the patient has 4 circular rounded wounds on the left anterior but mostly on the left lateral lower extremity. There is a small open area on the right mid tibia with a linear, shape towards a another small circular area on the right lateral. None of these require debridement. The surfaces look reasonably healthy Electronic Signature(s) Signed: 01/10/2019 5:29:39 PM By: Linton Ham MD Entered By: Linton Ham on 01/10/2019 10:08:19 -------------------------------------------------------------------------------- Physician Orders Details Patient Name: Date of Service: [name] [name], [name] [name] 01/10/2019 9:00 AM Medical Record [id number] Patient Account Number: [id number] Date of Birth/Sex: Treating RN: 1945-11-29 (73 y.o. [name] [name] Primary Care Provider: [name] Other Clinician: Referring Provider: Treating Provider/Extender:Jairen Goldfarb, Tenna Child, [place] Siren in Treatment: 0 Verbal / Phone Orders: No Diagnosis Coding Follow-up Appointments Return Appointment in 1 week. Dressing Change Frequency Do not change entire dressing for one week. Skin Barriers/Peri-Wound Care TCA Cream or Ointment Wound Cleansing May shower and wash wound with soap and water. Primary Wound Dressing Wound #28 Right,Lateral Lower Leg Calcium Alginate with Silver Wound #29 Right,Posterior Lower Leg Calcium Alginate with Silver Wound #30 Right,Lateral Lower Leg Calcium Alginate with Silver Wound #31 Right,Distal,Posterior Lower Leg Calcium Alginate with Silver Wound #32 Left,Anterior Lower Leg Calcium Alginate with Silver Wound #33 Left,Proximal,Lateral Lower Leg Calcium Alginate with Silver Wound #34 Left,Distal,Lateral Lower Leg Calcium Alginate with Silver Secondary Dressing Wound #28 Right,Lateral Lower Leg ABD pad Wound #29 Right,Posterior Lower Leg ABD pad Wound #30 Right,Lateral Lower Leg ABD pad Wound #31 Right,Distal,Posterior Lower Leg ABD pad Wound #32 Left,Anterior Lower Leg ABD pad Wound #33 Left,Proximal,Lateral Lower Leg ABD pad Wound #34 Left,Distal,Lateral Lower Leg ABD pad Edema Control Wound #28 Right,Lateral Lower Leg 4 layer compression - Bilateral Wound #29 Right,Posterior Lower Leg 4 layer compression - Bilateral Wound #30 Right,Lateral Lower Leg 4 layer compression - Bilateral Wound #31 Right,Distal,Posterior Lower Leg 4 layer compression - Bilateral Wound #32 Left,Anterior Lower Leg 4 layer compression - Bilateral Wound #33 Left,Proximal,Lateral Lower Leg 4 layer compression - Bilateral Wound #34 Left,Distal,Lateral Lower Leg 4 layer compression - Bilateral Electronic Signature(s) Signed: 01/10/2019 5:29:39 PM By: Linton Ham MD Signed: 01/10/2019 6:04:54 PM By: Carlene Coria RN Entered By: Carlene Coria on 01/10/2019 09:53:02 -------------------------------------------------------------------------------- Problem List Details Patient Name: Date of Service: [name] [name], [name] [name] 01/10/2019 9:00 AM Medical Record [id number] Patient Account Number: [id number] Date of Birth/Sex: Treating RN: January 28, 1946 (73 y.o. [name] [name] Primary Care Provider: [name] Other Clinician: Referring Provider: Treating Provider/Extender:Jasara Corrigan, Tenna Child, [place] Siren in Treatment: 0 Active Problems ICD-10 Evaluated Encounter Code Description Active Date Today Diagnosis I89.0 Lymphedema, not elsewhere classified 01/10/2019 No Yes L97.221 Non-pressure chronic ulcer of left calf limited to 01/10/2019 No Yes breakdown of skin L97.211 Non-pressure chronic ulcer of right calf limited to 01/10/2019  No Yes breakdown of skin E11.622 Type 2 diabetes mellitus with other skin ulcer 01/10/2019 No Yes Inactive Problems Resolved Problems Electronic Signature(s) Signed: 01/10/2019 5:29:39 PM By: Linton Ham MD Entered By: Linton Ham on 01/10/2019 10:01:29 -------------------------------------------------------------------------------- Progress Note Details Patient Name: Date of Service: [name] [name], [name] [name] 01/10/2019 9:00 AM Medical Record [id number] Patient Account Number: [id number] Date of Birth/Sex: Treating RN: [birth year] (73 y.o. [name] [name] Primary Care Provider: [name] Other Clinician: Referring Provider: Treating Provider/Extender:Johniya Durfee, Tenna Child, [place] Siren in Treatment: 0 Subjective Chief Complaint Information obtained from Patient Patient comes back in today for review of wounds on her bilateral lower extremities in the setting of lymphedema and chronic venous insufficiency 11/23/17; patient comes in today for recurrent wounds on her bilateral lower extremities in the setting of lymphedema and chronic venous insufficiency.  01/10/2019; patient returns to clinic for wounds on her bilateral lower extremities in the setting of lymphedema and chronic venous insufficiency History of Present Illness (HPI) 04/09/15; the patient is here for review of wounds on her bilateral lower extremities for the last month. The patient has a history of lymphedema and bilateral chronic venous insufficiency with inflammation she was here for review of wounds on her bilateral legs in 2014 she was discharged home with external compression pumps which she is currently not using. Over the last month she developed wounds on her bilateral lower legs with drainage and pain and she is here for our review of this. 04/16/15; the patient's edema is under much better control. She has 3 areas one on the right anterior medial leg one on the left posterior leg and a small open area with purulent drainage on the left anterior leg. I have cultured the area on the left anterior leg 04/23/15; continued improvement in the patient's edema. All her wounds of closed I see no open areas. Culture from the left anterior leg last time was negative. READMISSION 02/07/16; this patient is a woman that I saw her earlier in 2017. I believe we discharged her very quickly with bilateral wounds in her legs secondary to chronic venous hypertension with inflammation and secondary lymphedema. She had juxtalite stockings. She has external compression pumps at home from a stay in our clinic in 2014. She tells me that she's had wounds on her bilateral lower legs since March or April of this year. 3 wounds on the left 2 on the right. She is a type II diabetic and has a history of noncompressible vessels bilaterally although she is tolerated 4-layer compression. The last note from her primary physician Dr. [name] was in May. At that point Dr. [name] had referred the patient here for lymphedema management. The patient stated she made a phone call to year but was not able  to arrange an appointment. It is not clear that she is actually using anything on the wounds currently except some gauze that was saturated with drainage per our intake nurse. She is certainly not using her juxtalite stockings and by the patient's own admission she is not using her compression pumps because they are "irritating". I don't see a recent hemoglobin A1c. The patient states she has not been systemically unwell but the legs are painful 02/14/16; patient tolerated her Profore wraps reasonably well. She is using the compression pumps 1 time per day. Measurements of her legs are better in terms of the amount of lymphedema 02/21/16; patient tolerated her Profore wraps it. She says she is using her compression pumps one or 2 times a day.  Measurements of her leg circumference are improved and most of her wound dimensions are better, unfortunately the home health that we referred her to did not except her insurance but works successful in getting her accompanied that would except her insurance however she apparently has a $25 co-pay which she cannot afford as she is on a fixed income 03/05/16; patient states she did not back using the pumps. She is healed that 2 wounds on the left leg. She still has areas on the right lateral lower leg, right medial lower leg and left lateral lower leg. All of these look some better and have reduced in area 03/13/16; still 3 wounds 1 on the left lateral leg, one on the left medial/posterior leg which is really the most extensive and a small area on the right lateral leg. All of this in the setting of severe chronic lymphedema and chronic venous inflammation. Damage to the skin with cobblestone thickened skin. 03/20/16; still 2 areas. The area on the left lateral leg appears to of closed over. The right medial leg is still most extensive wound although it appears to be closing over right lateral only a small open area remains. Using silver alginate Profore  wraps 03/27/16; the area on the left leg remains closed. The edema control is better on the left than the right. The right medial leg is still weeping but everything appears to be epithelialized. The lateral aspect of the right leg appears closed. She tells me she has old juxtalite stockings. She has been compliant with her compression pumps. 04/03/16; the area on the left leg remains closed. She has ordered new juxtalite stockings. The right posterior medial wound is fully epithelialized. However she has an irritated area over the right Achilles that she states is been itching all week under the wraps.. She has been compliant with her compression pumps 04/10/16; the area on the left leg remains closed. The right posterior medial wound is also closed today. The irritated area over the right Achilles which was probably wrap injury is also fully epithelialized and closed. The patient has not received her new juxtalite stockings, she arrives in clinic today with an old juxtalite stocking in place. Sheis using her compression pumps secondary to her severe stage III lymphedema READMISSION 01/21/17; this is a patient we know from several previous stays in this clinic. She has lymphedema with chronic venous insufficiency and inflammation. The last time she was here we discharged her with bilateral juxta lites which she is compliant with. She also has external compression pumps at home from stays in our clinic in 2014 although she tells me that she is not very compliant with these as when she uses them a causes pain in her bilateral knees the patient states that her wounds on her left greater than right leg including 2 large areas on the left anterior and left lateral leg and an area on the right leg open in late June or July since then she is been followed by [facility] and she's had bilateral Unna boot supplied every week for the last month.. I think the wounds have actually improved although they are  not specifically dressed. She states she had an ultrasound to rule out DVT bilaterally that was negative. She does not have arterial studies although she is a diabetic. She states she has a lot of pain in her legs she states she does not use her external compression pumps because it causes pain in her knees. As usual her ABIs were  noncompressible bilaterally in this clinic 01/28/17; currently the patient only has one small wound on the left lateral leg and I think this should be healed next week. We are going to try to get her bilateral extremitease stockings. She tells me that compression pumps heard her posterior rib arthritic knees so she is not been compliant with this 02/04/17; the patient's wounds are totally healed. She has chronic venous insufficiency and secondary lymphedema. She has new extremitease stockings. She also has compression pumps Readmission: 04/21/17 on evaluation today patient appears to be doing about the same as what she was previous when we were evaluating her back in November 2018. At that point she had completely healed. With that being said she has now reopened and states that she wasn't aware that she was supposed to come back. 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 Subsequently she also states that although she has the compression that we got for her previously the extremitease stockings she nonetheless doesn't always wear these and being noncompliant often has things will reopen as far as ulcers on her lower extremities. No fevers, chills, nausea, or vomiting noted at this time. Patient has no evidence of dementia. She is having some discomfort in regard to the ulcers at this point. 04/28/17 on evaluation today patient appears to be doing significantly better in regard to her wounds. With that being said she is still having significant discomfort and is not  really keen on sharp debridement. She does not appear to have any evidence of infection which is good news I do think the Iodoflex is beneficial for her. Of note her ABI's were found in the system to be on the right 1.21 with a TBI of 0.76 and on the left 1.35 with a TBI of 0.75. In general her swelling appears to be significantly improved however. No fevers, chills, nausea, or vomiting noted at this time. Patient has been tolerating the wraps without complication. She has no evidence of dementia. 05/05/17-she is here in follow-up evaluation for bilateral lower extremity venous and lymphedema ulcers. She states she has not been using her lymphedema pumps secondary to pain at her knees. After discussion it was noted that she uses knee-high lymphedema pumps but does have a pair of thigh-high lymphedema pumps. She was strongly advised to use the thigh-high lymphedema pumps and if necessary reduce the setting for improved tolerance. She reports no change in odor and/or drainage. She continues to tolerate sharp debridement poorly with significant pain, primarily to the right lower extremity. We will continue with current treatment plan, along with improved lymphedema pump use and follow-up next week 05/12/17 on evaluation today patient actually appears to be doing much better in regard to her bilateral lower extremity ulcers. Only the right altar actually requires debridement today. The other two cleaned off nicely in two of the three in fact on the left lower extremity or actually almost completely healed. She does have some weeping noted still the bilateral lower extremities left greater than right. She still is not able to use her compression pumps even though I have mentioned this several times to her as did Leah last week. No fevers, chills, nausea, or vomiting noted at this time. Patient has been tolerating the dressing changes without complication. Patient has no evidence of dementia 05/19/17 on  evaluation today patient appears to be doing very well regard to her bilateral lower extremity  ulcers. In fact she seems to be improving and in fact healed in a couple of locations that were giving her trouble last week. Overall I'm extremely pleased with how things have progressed. She still has some discomfort especially in the right lateral lower extremity. Fortunately there does not appear to be any evidence of significant infection which is good news she does tell me that she has used her compression pumps several times although not routinely over the past week I did praise her for this. I do think it will be beneficial and that might even be what's helping with her wounds as far as healing is concerned at least to a degree. No fevers, chills, nausea, or vomiting noted at this time. Patient has no dementia and no evidence of infection. 05/26/17 on evaluation today patient presents for evaluation concerning her bilateral lower extremity ulcer secondary to lymphedema. Fortunately she appears to be doing very well at this point in time. Specifically her right leg appears to be completely closed and healed. The left leg though there is still a small area of opening overall has healed. She does have a little bit of excoriation at the top or the wraps are because they slid down on her. Otherwise there's no evidence of infection and the other has improved dramatically now that she is not draining as significantly. Patient is having no significant pain she tells me she has been able to use her lymphedema pumps one time a day although that's not all she can tolerate. Even that is difficult for her. 06/02/17 on evaluation today patient's ulcers appear to be completely closed at this point. She does still note some odor but I believe this is due to the fact she has not been able to wash her legs as well currently. Fortunately she did bring her compression with her today. She does have the EXTREMIT-EASE  Compression. With that being said she is somewhat nervous about going back to this as previously she broke out yet again once we discontinue wrapping her. READMISSION 11/23/17; patient is now 73 year old diabetic woman with severe chronic venous insufficiency with lymphedema. We care for her last in this clinic in February 2019 at which time she had chronic venous insufficiency with lymphedema bilateral lower extremity ulcers. We discharged her with bilateral wrap around/extremitease stockings and compression pumps. Is fairly clear she is not using the compression pumps stating it causes pain in her knees. In any case she was admitted to hospital from 8/3 through 8/6 with a several week history of recurrent bilateral ulcers. She was felt to have bilateral cellulitis treated with doxycycline IV. She is still on oral doxycycline and has another 2 days worth. At discharge her white count was 14.1 hemoglobin at 8.9. The patient is a diabetic however her last ABIs in 2018 showed a right ABI of 1.21, left of 1.35. TBI's were 0.76 on the right 0.75 on the left. Posterior tibial artery was monophasic on the right triphasic on the left. Dorsalis pedis triphasic on the right and triphasic on the left. She is not felt to have significant macrovascular disease 12/02/17;significant bilateral lower extremity wounds secondary to severe uncontrolled lymphedema. She did not get home health out to change the wraps. So she kept this on all week. She is telling me she using his her compression pumps once a week. In general all the wounds look better. We've been using silver alginate with secondary absorptive dressings 4 layer compression 12/10/17; bilateral lower extremity wounds secondary to uncontrolled  lymphedema. She tells me she is using her compression pumps on most days. We've been keeping wraps on all week. Using silver alginate. She has wounds on the right lateral calf left medial and left lateral calf. All of  this is somewhat better 12/16/17; the patient has 3 small open areas. One on the right lateral calf, one on the left medial calf and the small area on the left lateral calf. She has lymphedema, chronic venous insufficiency with hemosiderin deposition. Very fibrotic distal lower extremity scan with suggestion of an inverted bottle sign appearance to her legs. She has compression pumps but she does not use them has not used them in the last week. Currently we have her in 4 layer compression, silver alginate to the wounds and this is being changed once by home health. She is noncompliant with the compression pumps because she says it hurts her knees. She also has compression stockings ando Juxta light stockings at home 12/24/17; the patient's right lateral calf is healed as well as the left medial. There is no open wound on the right leg. She still has 2 areas on the left lateral calf. We transitioned the right leg into an extremitease stocking which she brought in from home. 12/31/17; the patient has a superficial wound on the left lateral lower calf. We've been wrapping this leg. She arrives today with the right leg swollen but without a wound. She cannot get her extremities stocking on the right leg. There is a lot more edema here. She is not using her compression pumps 01/10/2018; patient has a same superficial wound on the left lateral calf. Her right leg has less peripheral edema. She tells me she also started herself on some Lasix that had been previously prescribed for her at some point but she had never taken it. She is also concerned about whether her compression pumps are leaking. She has extremitease stockings however the edema gets bad enough she can get these on. Currently she has no open wound on the right leg and is mention the edema is actually better than when I saw this 10 days ago 01/17/2018; patient has a same superficial area on the posterior left calf and a weeping area in the mid  part of the right lateral calf. She still does not have anyone from [facility] coming to see her compression pumps which she describes as being suspicious for a leakage. I am not really sure how frequently she is using this in any case. She has massive bilateral calf lymphedema. I think there is some spreading lymphedema into her posterior thighs. 01/24/2018; the patient's edema in her legs is actually some better. She still has a smaller area on the left posterior calf although this is better. The weeping area on the right lateral calf is also better. As it turned out she did not get her pumps from [facility] but [facility] and they are going to try to go out and see these this week which is going to help. I explained to her that she will use the compression pumps once a day while we are wrapping her legs but when she transitions to stockings that may be she will require pumping twice a day. Her compliance in this area has not been high and I wonder about her ability to do this herself. 02/01/2018; the area on the posterior left leg is healed. She still has the original wound on the right lateral calf which is epithelialized. The however just above this  there is still weeping lymphedema fluid. She has a new area on the upper right calf which is either wrap injury or is she points out where she has been scratching under the wraps. We still not have managed to get a hold of a representative of [facility] to see if the pump is operational. She has extremitease stockings at home however I am not of the opinion that this will maintain her skin integrity without the compression pumps. 02/08/2018; posterior left leg remains healed although she has a new small open area on the left lateral calf. Right lateral calf has two quarter sized open areas and close juxtaposition. There is less weeping edema fluid. She apparently managed to contact [facility]. They are sending  her a part. But she is still not received it 02/15/2018; the patient has no open wound on either leg. Some scale present on the right upper lateral calf just below the fibular head. Edema control is excellent. She did not bring her stockings or her juxta lite wrap around. She has the part for her compression pump but she has not use the compression pumps yet READMISSION 01/10/2019 Patient is now a 73 year old woman that we have had in the clinic multiple times in the past. She has chronic lower extremity lymphedema, recurrent wounds on her bilateral lower extremities. She is also a type II diabetic reasonably well controlled with a recent hemoglobin A1c while she was in the hospital of 5.9. She has wounds on her bilateral lower extremities mostly on the left lateral and right lateral. These were noted also during her recent hospitalization. The patient states they have been there for a while. She has not been able to wear her stockings he has trouble getting them on as she lives alone. She also has external compression pumps but uses them sparingly because it causes pain in her knees which hurt because of osteoarthritis. The patient was on doxycycline before she went into the hospital apparently for fear of infection in her legs and is on Keflex coming out because of a UTI The patient was recently in the hospital from 01/06/2019 through 01/09/2019. She was admitted with nausea and vomiting and prerenal azotemia. This was corrected with IV fluid her Lasix has been put on hold. Hemoglobin A1c at 5.9 her metformin was discontinued. She was also felt to have a UTI she was prescribed Keflex at 503 times a day she is picking that up today. The patient is not felt to have an arterial issue. ABIs in our clinic were 1.24 on the right and 1.06 on the left. She had formal studies in 2018 at which time her ABIs were 1.21 on the right TBI of 0.76 and on the left 1.35 and 0.75 respectively. Waveforms are on the  right were monophasic and biphasic, triphasic and biphasic on the left. She is tolerated 4 layer compression in the past Patient History Information obtained from Patient. Allergies oxycodone HCl (Severity: Mild, Reaction: nausea, vomiting), penicillin (Severity: Moderate, Reaction: itching), adhesive (Reaction: rash) Family History Cancer - Mother- lung, Diabetes - maternal uncle, Lung Disease - Father- COPD, No family history of Heart Disease, Hereditary Spherocytosis, Hypertension, Kidney Disease, Seizures, Stroke, Thyroid Problems, Tuberculosis. Social History Former smoker - 2ppd cigarette x 60yr - quit 11/23/1985, Marital Status - Single, Alcohol Use - Never, Drug Use - No History, Caffeine Use - Moderate - coffee, tea, soda. Medical History Eyes Patient has history of Cataracts - both eyes Denies history of Glaucoma, Optic Neuritis Ear/Nose/Mouth/Throat  Denies history of Chronic sinus problems/congestion, Middle ear problems Hematologic/Lymphatic Patient has history of Lymphedema - BLE Denies history of Anemia, Hemophilia, Human Immunodeficiency Virus, Sickle Cell Disease Respiratory Denies history of Aspiration, Asthma, Chronic Obstructive Pulmonary Disease (COPD), Pneumothorax, Sleep Apnea, Tuberculosis Cardiovascular Patient has history of Arrhythmia - PAC, PVC,SVT,MURMUR, Tachycardia, Congestive Heart Failure, Hypertension, Peripheral Venous Disease - bilat Denies history of Angina, Coronary Artery Disease, Deep Vein Thrombosis, Hypotension, Myocardial Infarction, Peripheral Arterial Disease, Phlebitis, Vasculitis Gastrointestinal Denies history of Cirrhosis , Colitis, Crohnoos, Hepatitis A, Hepatitis B, Hepatitis C Endocrine Patient has history of Type II Diabetes - does not check BG Denies history of Type I Diabetes Genitourinary Denies history of End Stage Renal Disease Immunological Denies history of Lupus Erythematosus, Raynaudoos, Scleroderma Integumentary  (Skin) Denies history of History of Burn Musculoskeletal Patient has history of Osteoarthritis - bilat. knees, spine, back, fingers Denies history of Gout, Rheumatoid Arthritis, Osteomyelitis Neurologic Patient has history of Neuropathy Denies history of Dementia, Quadriplegia, Paraplegia, Seizure Disorder Oncologic Denies history of Received Chemotherapy, Received Radiation Psychiatric Patient has history of Confinement Anxiety Denies history of Anorexia/bulimia Hospitalization/Surgery History - cesarean section. - US echocardiography EF 55-60%. - SVT ablation. Medical And Surgical History Notes Constitutional Symptoms (General Health) morbid obesity , physical deconditioning Ear/Nose/Mouth/Throat allergic rhinitis Respiratory pneumonia as child Cardiovascular aortic stenosis, morbid obesity , hypokalemia , Gastrointestinal GERD , mixed incontinence Genitourinary acute kidney injury , mixed incontinence Musculoskeletal arthritis , multilevel spine pain Review of Systems (ROS) Constitutional Symptoms (General Health) Complains or has symptoms of Fatigue. Eyes Complains or has symptoms of Glasses / Contacts. Denies complaints or symptoms of Dry Eyes, Vision Changes. Ear/Nose/Mouth/Throat Denies complaints or symptoms of Chronic sinus problems or rhinitis. Respiratory Denies complaints or symptoms of Chronic or frequent coughs, Shortness of Breath. Genitourinary Denies complaints or symptoms of Frequent urination, recent UTI Integumentary (Skin) Complains or has symptoms of Wounds - bil lower legs. Musculoskeletal Complains or has symptoms of Muscle Weakness. Psychiatric Denies complaints or symptoms of Claustrophobia, Suicidal. Objective Constitutional Respirations regular, non-labored and within target range.. Temperature is normal and within the target range for the patient.Marland Kitchen Appears in no distress. Vitals Time Taken: 8:49 AM, Height: 71 in, Source: Stated, Weight:  240 lbs, Source: Stated, BMI: 33.5, Temperature: 98.3 F, Pulse: 80 bpm, Respiratory Rate: 20 breaths/min. Eyes Conjunctivae clear. No discharge.no icterus. Respiratory work of breathing is normal. Bilateral breath sounds are clear and equal in all lobes with no wheezes, rales or rhonchi.. Cardiovascular Somewhat irregular pulse however she is not tachycardic. She appears to be euvolemic. 2 out of 6 pansystolic murmur at the lower left sternal border sounds like TR. Pedal pulses palpable bilaterally in her feet. Very poor edema control with nonpitting edema bilaterally. Lymphatic None palpable in the popliteal area bilaterally. Psychiatric appears at normal baseline. General Notes: Wound exam; the patient has 4 circular rounded wounds on the left anterior but mostly on the left lateral lower extremity. There is a small open area on the right mid tibia with a linear, shape towards a another small circular area on the right lateral. None of these require debridement. 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 No current evidence of cellulitis. Wound #28 status is Open. Original cause of wound was Gradually Appeared. The wound is located on the Right,Lateral Lower Leg. The wound measures 3.5cm length x 13cm width x 0.1cm depth; 35.736cm^2 area and 3.574cm^3 volume. There is Fat Layer (Subcutaneous Tissue) Exposed  exposed. There is no tunneling or undermining noted. There is a medium amount of serosanguineous drainage noted. The wound margin is flat and intact. There is large (67-100%) red granulation within the wound bed. There is no necrotic tissue within the wound bed. Wound #29 status is Open. Original cause of wound was Gradually Appeared. The wound is located on the Right,Posterior Lower Leg. The wound measures 1.5cm length x 1.5cm width x 0.1cm depth; 1.767cm^2 area and 0.177cm^3 volume.  There is Fat Layer (Subcutaneous Tissue) Exposed exposed. There is no tunneling or undermining noted. There is a medium amount of serosanguineous drainage noted. The wound margin is flat and intact. There is large (67-100%) red granulation within the wound bed. There is a small (1-33%) amount of necrotic tissue within the wound bed including Adherent Slough. Wound #30 status is Open. Original cause of wound was Gradually Appeared. The wound is located on the Right,Lateral Lower Leg. The wound measures 1.1cm length x 1.1cm width x 0.1cm depth; 0.95cm^2 area and 0.095cm^3 volume. There is Fat Layer (Subcutaneous Tissue) Exposed exposed. There is no tunneling or undermining noted. There is a medium amount of serosanguineous drainage noted. The wound margin is flat and intact. There is large (67-100%) red granulation within the wound bed. There is no necrotic tissue within the wound bed. Wound #31 status is Open. Original cause of wound was Gradually Appeared. The wound is located on the Right,Distal,Posterior Lower Leg. The wound measures 1cm length x 1.3cm width x 0.1cm depth; 1.021cm^2 area and 0.102cm^3 volume. There is Fat Layer (Subcutaneous Tissue) Exposed exposed. There is no tunneling or undermining noted. There is a medium amount of serosanguineous drainage noted. The wound margin is flat and intact. There is medium (34-66%) red granulation within the wound bed. There is a medium (34-66%) amount of necrotic tissue within the wound bed including Adherent Slough. Wound #32 status is Open. Original cause of wound was Gradually Appeared. The wound is located on the Left,Anterior Lower Leg. The wound measures 1.5cm length x 1.1cm width x 0.1cm depth; 1.296cm^2 area and 0.13cm^3 volume. There is Fat Layer (Subcutaneous Tissue) Exposed exposed. There is no tunneling or undermining noted. There is a medium amount of serosanguineous drainage noted. The wound margin is flat and intact. There is large  (67-100%) red granulation within the wound bed. There is no necrotic tissue within the wound bed. Wound #33 status is Open. Original cause of wound was Gradually Appeared. The wound is located on the Left,Proximal,Lateral Lower Leg. The wound measures 4cm length x 11cm width x 0.1cm depth; 34.558cm^2 area and 3.456cm^3 volume. There is Fat Layer (Subcutaneous Tissue) Exposed exposed. There is no tunneling or undermining noted. There is a medium amount of serosanguineous drainage noted. The wound margin is flat and intact. There is large (67-100%) red, pink granulation within the wound bed. There is no necrotic tissue within the wound bed. Wound #34 status is Open. Original cause of wound was Gradually Appeared. The wound is located on the Left,Distal,Lateral Lower Leg. The wound measures 1.8cm length x 1.9cm width x 0.1cm depth; 2.686cm^2 area and 0.269cm^3 volume. There is Fat Layer (Subcutaneous Tissue) Exposed exposed. There is no tunneling or undermining noted. There is a medium amount of serosanguineous drainage noted. The wound margin is flat and intact. There is large (67-100%) red granulation within the wound bed. There is a small (1-33%) amount of necrotic tissue within the wound bed including Adherent Slough. Assessment Active Problems ICD-10 Lymphedema, not elsewhere classified Non-pressure chronic ulcer  of left calf limited to breakdown of skin Non-pressure chronic ulcer of right calf limited to breakdown of skin Type 2 diabetes mellitus with other skin ulcer Procedures Wound #28 Pre-procedure diagnosis of Wound #28 is a Lymphedema located on the Right,Lateral Lower Leg . There was a Four Layer Compression Therapy Procedure by Carlene Coria, RN. Post procedure Diagnosis Wound #28: Same as Pre-Procedure Wound #29 Pre-procedure diagnosis of Wound #29 is a Lymphedema located on the Right,Posterior Lower Leg . There was a Four Layer Compression Therapy Procedure by Carlene Coria,  RN. Post procedure Diagnosis Wound #29: Same as Pre-Procedure Wound #30 Pre-procedure diagnosis of Wound #30 is a Lymphedema located on the Right,Lateral Lower Leg . There was a Four Layer Compression Therapy Procedure by Carlene Coria, RN. Post procedure Diagnosis Wound #30: Same as Pre-Procedure Wound #31 Pre-procedure diagnosis of Wound #31 is a Lymphedema located on the Right,Distal,Posterior Lower Leg . There was a Four Layer Compression Therapy Procedure by Carlene Coria, RN. Post procedure Diagnosis Wound #31: Same as Pre-Procedure Wound #32 Pre-procedure diagnosis of Wound #32 is a Lymphedema located on the Left,Anterior Lower Leg . There was a Four Layer Compression Therapy Procedure by Carlene Coria, RN. Post procedure Diagnosis Wound #32: Same as Pre-Procedure Wound #33 Pre-procedure diagnosis of Wound #33 is a Lymphedema located on the Left,Proximal,Lateral Lower Leg . There was a Four Layer Compression Therapy Procedure by Carlene Coria, RN. Post procedure Diagnosis Wound #33: Same as Pre-Procedure Wound #34 Pre-procedure diagnosis of Wound #34 is a Lymphedema located on the Left,Distal,Lateral Lower Leg . There was a Four Layer Compression Therapy Procedure by Carlene Coria, RN. Post procedure Diagnosis Wound #34: Same as Pre-Procedure Plan Follow-up Appointments: Return Appointment in 1 week. Dressing Change Frequency: Do not change entire dressing for one week. Skin Barriers/Peri-Wound Care: TCA Cream or Ointment Wound Cleansing: May shower and wash wound with soap and water. Primary Wound Dressing: Wound #28 Right,Lateral Lower Leg: Calcium Alginate with Silver Wound #29 Right,Posterior Lower Leg: Calcium Alginate with Silver Wound #30 Right,Lateral Lower Leg: Calcium Alginate with Silver Wound #31 Right,Distal,Posterior Lower Leg: Calcium Alginate with Silver Wound #32 Left,Anterior Lower Leg: Calcium Alginate with Silver Wound #33 Left,Proximal,Lateral Lower  Leg: Calcium Alginate with Silver Wound #34 Left,Distal,Lateral Lower Leg: Calcium Alginate with Silver Secondary Dressing: Wound #28 Right,Lateral Lower Leg: ABD pad Wound #29 Right,Posterior Lower Leg: ABD pad Wound #30 Right,Lateral Lower Leg: ABD pad Wound #31 Right,Distal,Posterior Lower Leg: ABD pad Wound #32 Left,Anterior Lower Leg: ABD pad Wound #33 Left,Proximal,Lateral Lower Leg: ABD pad Wound #34 Left,Distal,Lateral Lower Leg: ABD pad Edema Control: Wound #28 Right,Lateral Lower Leg: 4 layer compression - Bilateral Wound #29 Right,Posterior Lower Leg: 4 layer compression - Bilateral Wound #30 Right,Lateral Lower Leg: 4 layer compression - Bilateral Wound #31 Right,Distal,Posterior Lower Leg: 4 layer compression - Bilateral Wound #32 Left,Anterior Lower Leg: 4 layer compression - Bilateral Wound #33 Left,Proximal,Lateral Lower Leg: 4 layer compression - Bilateral Wound #34 Left,Distal,Lateral Lower Leg: 4 layer compression - Bilateral 1. Silver alginate ABDs 4 layer compression 2. TCA and skin lubrication 3. I have tried to talk her into using the compression pumps of at least once a day. 4. He is not able to get on her stockings I think when she was discharged last year this was a combination of a compression stocking with juxta lites. I am not sure how we will resolve the edema issue once these wounds heal although we will have to cross that bridge at some point.  5. She does not have home health but thinks they are coming out to her house if they require wound care orders will get them to change this dressing 1 time per week 6. She was on doxycycline before she went to the hospital and now on Keflex for UTI. I do not see any current evidence of infection Electronic Signature(s) Signed: 01/10/2019 10:12:02 AM By: Linton Ham MD Entered By: Linton Ham on 01/10/2019  10:12:02 -------------------------------------------------------------------------------- HxROS Details Patient Name: Date of Service: [name] [name], [name] [name] 01/10/2019 9:00 AM Medical Record [id number] Patient Account Number: [id number] Date of Birth/Sex: Treating RN: January 23, 1946 (73 y.o. Elam Dutch Primary Care Provider: [name] Other Clinician: Referring Provider: Treating Provider/Extender:Zariah Jost, Tenna Child, [place] Siren in Treatment: 0 Information Obtained From Patient Constitutional Symptoms (General Health) Complaints and Symptoms: Positive for: Fatigue Medical History: Past Medical History Notes: morbid obesity , physical deconditioning Eyes Complaints and Symptoms: Positive for: Glasses / Contacts Negative for: Dry Eyes; Vision Changes Medical History: Positive for: Cataracts - both eyes Negative for: Glaucoma; Optic Neuritis Ear/Nose/Mouth/Throat Complaints and Symptoms: Negative for: Chronic sinus problems or rhinitis Medical History: Negative for: Chronic sinus problems/congestion; Middle ear problems Past Medical History Notes: allergic rhinitis Respiratory Complaints and Symptoms: Negative for: Chronic or frequent coughs; Shortness of Breath Medical History: Negative for: Aspiration; Asthma; Chronic Obstructive Pulmonary Disease (COPD); Pneumothorax; Sleep Apnea; Tuberculosis Past Medical History Notes: pneumonia as child Genitourinary Complaints and Symptoms: Negative for: Frequent urination Review of System Notes: recent UTI Medical History: Negative for: End Stage Renal Disease Past Medical History Notes: acute kidney injury , mixed incontinence Integumentary (Skin) Complaints and Symptoms: Positive for: Wounds - bil lower legs Medical History: Negative for: History of Burn Musculoskeletal Complaints and Symptoms: Positive for: Muscle Weakness Medical History: Positive for: Osteoarthritis - bilat. knees, spine,  back, fingers Negative for: Gout; Rheumatoid Arthritis; Osteomyelitis Past Medical History Notes: arthritis , multilevel spine pain Psychiatric Complaints and Symptoms: Negative for: Claustrophobia; Suicidal Medical History: Positive for: Confinement Anxiety Negative for: Anorexia/bulimia Hematologic/Lymphatic Medical History: Positive for: Lymphedema - BLE Negative for: Anemia; Hemophilia; Human Immunodeficiency Virus; Sickle Cell Disease Cardiovascular Medical History: Positive for: Arrhythmia - PAC, PVC,SVT,MURMUR, Tachycardia; Congestive Heart Failure; Hypertension; Peripheral Venous Disease - bilat Negative for: Angina; Coronary Artery Disease; Deep Vein Thrombosis; Hypotension; Myocardial Infarction; Peripheral Arterial Disease; Phlebitis; Vasculitis Past Medical History Notes: aortic stenosis, morbid obesity , hypokalemia , Gastrointestinal Medical History: Negative for: Cirrhosis ; Colitis; Crohns; Hepatitis A; Hepatitis B; Hepatitis C Past Medical History Notes: GERD , mixed incontinence Endocrine Medical History: Positive for: Type II Diabetes - does not check BG Negative for: Type I Diabetes Time with diabetes: 3 years Treated with: Oral agents Blood sugar tested every day: No Immunological Medical History: Negative for: Lupus Erythematosus; Raynauds; Scleroderma Neurologic Medical History: Positive for: Neuropathy Negative for: Dementia; Quadriplegia; Paraplegia; Seizure Disorder Oncologic Medical History: Negative for: Received Chemotherapy; Received Radiation HBO Extended History Items Eyes: Cataracts Immunizations Pneumococcal Vaccine: Received Pneumococcal Vaccination: No Tetanus Vaccine: Last tetanus shot: 04/06/2010 Implantable Devices None Hospitalization / Surgery History Type of Hospitalization/Surgery cesarean section US echocardiography EF 55-60% SVT ablation Family and Social History Cancer: Yes - Mother- lung; Diabetes: Yes - maternal  uncle; Heart Disease: No; Hereditary Spherocytosis: No; Hypertension: No; Kidney Disease: No; Lung Disease: Yes - Father- COPD; Seizures: No; Stroke: No; Thyroid Problems: No; Tuberculosis: No; Former smoker - 2ppd cigarette x 25yr - quit 11/23/1985; Marital Status - Single; Alcohol Use: Never; Drug Use: No History; Caffeine Use: Moderate - coffee,  tea, soda; Financial Concerns: Yes - medical; Food, Clothing or Shelter Needs: No; Support System Lacking: No; Transportation Concerns: No Engineer, maintenance) Signed: 01/10/2019 5:29:39 PM By: Linton Ham MD Signed: 01/10/2019 5:55:43 PM By: Baruch Gouty RN, BSN Entered By: Baruch Gouty on 01/10/2019 08:56:56 -------------------------------------------------------------------------------- [place] Details Patient Name: Date of Service: [name] [name], [name] [name] 01/10/2019 Medical Record [id number] Patient Account Number: [id number] Date of Birth/Sex: Treating RN: 12-Aug-1945 (73 y.o. [name] [name] Primary Care Provider: [name] Other Clinician: Referring Provider: Treating Provider/Extender:Lourie Retz, Tenna Child, [place] Siren in Treatment: 0 Diagnosis Coding ICD-10 Codes Code Description I89.0 Lymphedema, not elsewhere classified L97.221 Non-pressure chronic ulcer of left calf limited to breakdown of skin L97.211 Non-pressure chronic ulcer of right calf limited to breakdown of skin E11.622 Type 2 diabetes mellitus with other skin ulcer Facility Procedures Physician Procedures CPT4 Code Description: [id number] - WC PHYS LEVEL 4 - EST PT ICD-10 Diagnosis Description I89.0 Lymphedema, not elsewhere classified L97.221 Non-pressure chronic ulcer of left calf limited to br L97.211 Non-pressure chronic ulcer of right calf limited  to b E11.622 Type 2 diabetes mellitus with other skin ulcer Modifier: eakdown of skin reakdown of skin Quantity: 1 Electronic Signature(s) Signed: 01/10/2019 5:29:39 PM By: Linton Ham MD Signed: 01/10/2019 6:04:54 PM By: Carlene Coria RN Entered By: Carlene Coria on 01/10/2019 16:13:44

## 2019-01-10 NOTE — Progress Notes (Signed)
ROZALYNN, ENSLOW ([id number]) Visit Report for 01/10/2019 Abuse/Suicide Risk Screen Details Patient Name: Date of Service: [name] [name], [name] [name] 01/10/2019 9:00 AM Medical Record [id number] Patient Account Number: [id number] Date of Birth/Sex: Treating RN: [birth year] (73 y.o. [name] [name] Primary Care Deshawnda Acrey: [name] Other Clinician: Referring Kayli Beal: Treating Arjuna Doeden/Extender:Robson, Tenna Child, [place] Siren in Treatment: 0 Abuse/Suicide Risk Screen Items Answer ABUSE RISK SCREEN: Has anyone close to you tried to hurt or harm you recentlyo No Do you feel uncomfortable with anyone in your familyo No Has anyone forced you do things that you didnt want to doo No Electronic Signature(s) Signed: 01/10/2019 5:55:43 PM By: Baruch Gouty RN, BSN Entered By: Baruch Gouty on 01/10/2019 08:57:17 -------------------------------------------------------------------------------- Activities of Daily Living Details Patient Name: Date of Service: [name] [name], [name] [name] 01/10/2019 9:00 AM Medical Record [id number] Patient Account Number: [id number] Date of Birth/Sex: Treating RN: December 16, 1945 (73 y.o. [name] [name] Primary Care Noemi Bellissimo: [name] Other Clinician: Referring Forest Pruden: Treating Javohn Basey/Extender:Robson, Tenna Child, [place] Siren in Treatment: 0 Activities of Daily Living Items Answer Activities of Daily Living (Please select one for each item) Drive Automobile Not Able Take Medications Completely Able Use Telephone Completely Able Care for Appearance Completely Able Use Toilet Completely Able Bath / Shower Completely Able Dress Self Completely Able Feed Self Completely Able Walk Need Assistance Get In / Out Bed Completely Able Housework Need Assistance Prepare Meals Completely [place] for Self Need Assistance Electronic Signature(s) Signed: 01/10/2019 5:55:43 PM By: Baruch Gouty  RN, BSN Entered By: Baruch Gouty on 01/10/2019 08:57:51 -------------------------------------------------------------------------------- Education Screening Details Patient Name: Date of Service: [name] [name], [name] [name] 01/10/2019 9:00 AM Medical Record [id number] Patient Account Number: [id number] Date of Birth/Sex: Treating RN: 09/10/1945 (73 y.o. [name] [name] Primary Care Shruthi Northrup: [name] Other Clinician: Referring Varnell Orvis: Treating Lydiann Bonifas/Extender:Robson, Tenna Child, [place] Siren in Treatment: 0 Primary Learner Assessed: Patient Learning Preferences/Education Level/Primary Language Learning Preference: Explanation, Demonstration, Printed Material Highest Education Level: College or Above Preferred Language: English Cognitive Barrier Language Barrier: No Translator Needed: No Memory Deficit: No Emotional Barrier: No Cultural/Religious Beliefs Affecting Medical Care: No Physical Barrier Impaired Vision: Yes Glasses Impaired Hearing: No Decreased Hand dexterity: No Knowledge/Comprehension Knowledge Level: High Comprehension Level: High Ability to understand written High instructions: Ability to understand verbal High instructions: Motivation Anxiety Level: Calm Cooperation: Cooperative Education Importance: Acknowledges Need Interest in Health Problems: Asks Questions Perception: Coherent Willingness to Engage in Self- High Management Activities: Readiness to Engage in Self- High Management Activities: Electronic Signature(s) Signed: 01/10/2019 5:55:43 PM By: Baruch Gouty RN, BSN Entered By: Baruch Gouty on 01/10/2019 08:58:30 -------------------------------------------------------------------------------- Fall Risk Assessment Details Patient Name: Date of Service: [name] [name], [name] [name] 01/10/2019 9:00 AM Medical Record [id number] Patient Account Number: [id number] Date of Birth/Sex: Treating RN: 02-02-1946 (73 y.o. [name] [name] Primary Care Stryker Veasey: [name] Other Clinician: Referring Tyresha Fede: Treating Johngabriel Verde/Extender:Robson, Tenna Child, [place] Siren in Treatment: 0 Fall Risk Assessment Items Have you had 2 or more falls in the last 12 monthso 0 No Have you had any fall that resulted in injury in the last 12 monthso 0 No FALLS RISK SCREEN History of falling - immediate or within 3 months 0 No Secondary diagnosis (Do you have 2 or more medical diagnoseso) 0 No Ambulatory aid None/bed rest/wheelchair/nurse 0 No Crutches/cane/walker 15 Yes Furniture 0 No Intravenous therapy Access/Saline/Heparin Lock 0 No Weak (short steps with or without shuffle, stooped but able to lift head 0 No while walking, may seek support from furniture) Impaired (short steps with shuffle,  may have difficulty arising from chair, 0 No head down, impaired balance) Mental Status Oriented to own ability 0 Yes Overestimates or forgets limitations 0 No Risk Level: Low Risk Score: 15 Electronic Signature(s) Signed: 01/10/2019 5:55:43 PM By: Baruch Gouty RN, BSN Entered By: Baruch Gouty on 01/10/2019 08:59:07 -------------------------------------------------------------------------------- Foot Assessment Details Patient Name: Date of Service: [name] [name], [name] [name] 01/10/2019 9:00 AM Medical Record [id number] Patient Account Number: [id number] Date of Birth/Sex: Treating RN: Oct 04, 1945 (73 y.o. [name] [name] Primary Care Janyah Singleterry: [name] Other Clinician: Referring Rashee Marschall: Treating Afua Hoots/Extender:Robson, Tenna Child, [place] Siren in Treatment: 0 Foot Assessment Items Site Locations + = Sensation present, - = Sensation absent, C = Callus, U = Ulcer R = Redness, W = Warmth, M = Maceration, PU = Pre-ulcerative lesion F = Fissure, S = Swelling, D = Dryness Assessment Right: Left: Other Deformity: No No Prior Foot Ulcer: No No Prior Amputation: No No Charcot  Joint: No No Ambulatory Status: Ambulatory With Help Assistance Device: Walker GaitEnergy manager) Signed: 01/10/2019 5:55:43 PM By: Baruch Gouty RN, BSN Entered By: Baruch Gouty on 01/10/2019 09:14:45 -------------------------------------------------------------------------------- Nutrition Risk Screening Details Patient Name: Date of Service: [name] [name], [name] [name] 01/10/2019 9:00 AM Medical Record [id number] Patient Account Number: [id number] Date of Birth/Sex: Treating RN: July 28, 1945 (73 y.o. [name] [name] Primary Care Trason Shifflet: [name] Other Clinician: Referring Sheva Mcdougle: Treating Mikiya Nebergall/Extender:Robson, Tenna Child, [place] Siren in Treatment: 0 Height (in): 71 Weight (lbs): 240 Body Mass Index (BMI): 33.5 Nutrition Risk Screening Items Score Screening NUTRITION RISK SCREEN: I have an illness or condition that made me change the kind and/or 2 Yes amount of food I eat I eat fewer than two meals per day 0 No I eat few fruits and vegetables, or milk products 0 No I have three or more drinks of beer, liquor or wine almost every day 0 No I have tooth or mouth problems that make it hard for me to eat 0 No I don't always have enough money to buy the food I need 0 No I eat alone most of the time 1 Yes I take three or more different prescribed or over-the-counter drugs a day 1 Yes 0 No Without wanting to, I have lost or gained 10 pounds in the last six months I am not always physically able to shop, cook and/or feed myself 0 No Nutrition Protocols Good Risk Protocol Provide education on elevated blood sugars and Moderate Risk Protocol 0 impact on wound healing, as applicable High Risk Proctocol Risk Level: Moderate Risk Score: 4 Electronic Signature(s) Signed: 01/10/2019 5:55:43 PM By: Baruch Gouty RN, BSN Entered By: Baruch Gouty on 01/10/2019 08:59:54

## 2019-01-10 NOTE — Telephone Encounter (Signed)
Transition Care Management Follow-up Telephone Call   Date discharged? 01/09/19   How have you been since you were released from the hospital?  Pt states she is doing alright   Do you understand why you were in the hospital? YES   Do you understand the discharge instructions? YES   Where were you discharged to? HOME   Items Reviewed:  Medications reviewed: YES, not taking metformin, lasix, nor losartan until she come in to see MD  Allergies reviewed: YES  Dietary changes reviewed: YES, carb modified  Referrals reviewed: No referral recommeded   Functional Questionnaire:   Activities of Daily Living (ADLs):   She states she are independent in the following: bathing and hygiene, feeding, continence, grooming, toileting and dressing States they require assistance with the following: ambulation   Any transportation issues/concerns?: NO   Any patient concerns? NO   Confirmed importance and date/time of follow-up visits scheduled YES, appt 01/12/19  Provider Appointment booked with Dr. [name]  Confirmed with patient if condition begins to worsen call PCP or go to the ER.  Patient was given the office number and encouraged to call back with question or concerns.  : YES

## 2019-01-11 ENCOUNTER — Other Ambulatory Visit: Payer: Self-pay | Admitting: Internal Medicine

## 2019-01-11 DIAGNOSIS — I471 Supraventricular tachycardia: Secondary | ICD-10-CM

## 2019-01-12 ENCOUNTER — Other Ambulatory Visit: Payer: Self-pay

## 2019-01-12 ENCOUNTER — Encounter: Payer: Self-pay | Admitting: Internal Medicine

## 2019-01-12 ENCOUNTER — Ambulatory Visit (INDEPENDENT_AMBULATORY_CARE_PROVIDER_SITE_OTHER): Payer: PPO | Admitting: Internal Medicine

## 2019-01-12 ENCOUNTER — Other Ambulatory Visit (INDEPENDENT_AMBULATORY_CARE_PROVIDER_SITE_OTHER): Payer: PPO

## 2019-01-12 VITALS — BP 130/70 | HR 82 | Temp 97.4°F | Ht 71.0 in | Wt 263.0 lb

## 2019-01-12 DIAGNOSIS — N179 Acute kidney failure, unspecified: Secondary | ICD-10-CM | POA: Diagnosis not present

## 2019-01-12 DIAGNOSIS — N390 Urinary tract infection, site not specified: Secondary | ICD-10-CM

## 2019-01-12 LAB — COMPREHENSIVE METABOLIC PANEL
ALT: 11 U/L (ref 0–35)
AST: 16 U/L (ref 0–37)
Albumin: 3.3 g/dL — ABNORMAL LOW (ref 3.5–5.2)
Alkaline Phosphatase: 54 U/L (ref 39–117)
BUN: 16 mg/dL (ref 6–23)
CO2: 21 mEq/L (ref 19–32)
Calcium: 9.2 mg/dL (ref 8.4–10.5)
Chloride: 109 mEq/L (ref 96–112)
Creatinine, Ser: 1.14 mg/dL (ref 0.40–1.20)
GFR: 56.65 mL/min — ABNORMAL LOW (ref >60.00)
Glucose, Bld: 104 mg/dL — ABNORMAL HIGH (ref 70–99)
Potassium: 3.9 mEq/L (ref 3.5–5.1)
Sodium: 138 mEq/L (ref 135–145)
Total Bilirubin: 0.2 mg/dL (ref 0.2–1.2)
Total Protein: 7.3 g/dL (ref 6.0–8.3)

## 2019-01-12 NOTE — Progress Notes (Signed)
   Subjective:   Patient ID: [name] [name], female    DOB: March 12, 1946, 73 y.o.   MRN: [id number]  HPI The patient is a 73 YO female coming in for hospital follow up (in for AKI due to dehydration with nausea, felt to be due to uti). Still taking keflex. Still some nausea but drinking fluids. Denies vomiting. Denies diarrhea or constipation. No UTI symptoms. Denies headaches or chest pains. Is not feeling as weak. Has seen wound care and they are wrapping and caring for her legs. Denies fevers or chills. Overall improving. Having home health to come still.   PMH, [facility], social history reviewed and updated  Review of Systems  Constitutional: Positive for activity change, appetite change and fatigue. Negative for chills, diaphoresis, fever and unexpected weight change.  HENT: Negative.   Eyes: Negative.   Respiratory: Negative for cough, chest tightness and shortness of breath.   Cardiovascular: Positive for leg swelling. Negative for chest pain and palpitations.  Gastrointestinal: Positive for nausea. Negative for abdominal distention, abdominal pain, anal bleeding, blood in stool, constipation, diarrhea and vomiting.  Musculoskeletal: Negative.   Skin: Negative.   Neurological: Negative.   Psychiatric/Behavioral: Negative.     Objective:  Physical Exam Constitutional:      Appearance: She is well-developed.  HENT:     Head: Normocephalic and atraumatic.  Neck:     Musculoskeletal: Normal range of motion.  Cardiovascular:     Rate and Rhythm: Normal rate and regular rhythm.  Pulmonary:     Effort: Pulmonary effort is normal. No respiratory distress.     Breath sounds: Normal breath sounds. No wheezing or rales.  Abdominal:     General: Bowel sounds are normal. There is no distension.     Palpations: Abdomen is soft.     Tenderness: There is no abdominal tenderness. There is no rebound.  Musculoskeletal:     Right lower leg: Edema present.     Left lower leg: Edema present.   Comments: Stable, wrapped  Skin:    General: Skin is warm and dry.  Neurological:     Mental Status: She is alert and oriented to person, place, and time.     Coordination: Coordination abnormal.     Comments: Walker to ambulate, slow gait     Vitals:   01/12/19 1351  BP: 130/70  Pulse: 82  Temp: (!) 97.4 F (36.3 C)  TempSrc: Oral  SpO2: 99%  Weight: 263 lb (119.3 kg)  Height: 5\' 11"  (1.803 m)    Assessment & Plan:

## 2019-01-12 NOTE — Assessment & Plan Note (Signed)
Finishing keflex and overall is improving.

## 2019-01-12 NOTE — Assessment & Plan Note (Signed)
Needs repeat CMP and CBC today. Advised to keep pushing fluids and holding BP meds until eating and drinking well again. Is almost off keflex which will likely help with appetite and nausea. Using promethazine/zofran as needed and taking protonix daily.

## 2019-01-13 ENCOUNTER — Ambulatory Visit: Payer: Self-pay

## 2019-01-13 LAB — CBC
HCT: 29.5 % — ABNORMAL LOW (ref 36.0–46.0)
Hemoglobin: 9.4 g/dL — ABNORMAL LOW (ref 12.0–15.0)
MCHC: 31.9 g/dL (ref 30.0–36.0)
MCV: 75.9 fl — ABNORMAL LOW (ref 78.0–100.0)
Platelets: 399 10*3/uL (ref 150.0–400.0)
RBC: 3.89 Mil/uL (ref 3.87–5.11)
RDW: 18.9 % — ABNORMAL HIGH (ref 11.5–15.5)
WBC: 5.7 10*3/uL (ref 4.0–10.5)

## 2019-01-17 ENCOUNTER — Encounter (HOSPITAL_BASED_OUTPATIENT_CLINIC_OR_DEPARTMENT_OTHER): Payer: PPO | Admitting: Internal Medicine

## 2019-01-17 ENCOUNTER — Other Ambulatory Visit: Payer: Self-pay

## 2019-01-17 DIAGNOSIS — L97212 Non-pressure chronic ulcer of right calf with fat layer exposed: Secondary | ICD-10-CM | POA: Diagnosis not present

## 2019-01-17 DIAGNOSIS — L97822 Non-pressure chronic ulcer of other part of left lower leg with fat layer exposed: Secondary | ICD-10-CM | POA: Diagnosis not present

## 2019-01-17 DIAGNOSIS — I890 Lymphedema, not elsewhere classified: Secondary | ICD-10-CM | POA: Diagnosis not present

## 2019-01-17 DIAGNOSIS — L97222 Non-pressure chronic ulcer of left calf with fat layer exposed: Secondary | ICD-10-CM | POA: Diagnosis not present

## 2019-01-17 DIAGNOSIS — E11622 Type 2 diabetes mellitus with other skin ulcer: Secondary | ICD-10-CM | POA: Diagnosis not present

## 2019-01-23 ENCOUNTER — Telehealth: Payer: Self-pay | Admitting: Internal Medicine

## 2019-01-23 DIAGNOSIS — I890 Lymphedema, not elsewhere classified: Secondary | ICD-10-CM

## 2019-01-23 NOTE — Telephone Encounter (Signed)
Order placed, can let them know electronically.

## 2019-01-23 NOTE — Telephone Encounter (Signed)
Address: [place]

## 2019-01-23 NOTE — Telephone Encounter (Signed)
Sent message to [employer] [facility]

## 2019-01-23 NOTE — Telephone Encounter (Signed)
Patient called in stating she is needing a new order XL Rollater walker placed. Patient states it has broken and they do not do repairs, a new one is needed. Patient will call back to tell address of location to send order. Please advise.

## 2019-01-24 ENCOUNTER — Other Ambulatory Visit: Payer: Self-pay

## 2019-01-24 ENCOUNTER — Encounter (HOSPITAL_BASED_OUTPATIENT_CLINIC_OR_DEPARTMENT_OTHER): Payer: PPO | Admitting: Internal Medicine

## 2019-01-24 DIAGNOSIS — E11622 Type 2 diabetes mellitus with other skin ulcer: Secondary | ICD-10-CM | POA: Diagnosis not present

## 2019-01-24 DIAGNOSIS — L97822 Non-pressure chronic ulcer of other part of left lower leg with fat layer exposed: Secondary | ICD-10-CM | POA: Diagnosis not present

## 2019-01-24 DIAGNOSIS — L97212 Non-pressure chronic ulcer of right calf with fat layer exposed: Secondary | ICD-10-CM | POA: Diagnosis not present

## 2019-01-24 DIAGNOSIS — L97812 Non-pressure chronic ulcer of other part of right lower leg with fat layer exposed: Secondary | ICD-10-CM | POA: Diagnosis not present

## 2019-01-24 DIAGNOSIS — I890 Lymphedema, not elsewhere classified: Secondary | ICD-10-CM | POA: Diagnosis not present

## 2019-01-31 ENCOUNTER — Encounter (HOSPITAL_BASED_OUTPATIENT_CLINIC_OR_DEPARTMENT_OTHER): Payer: PPO | Admitting: Internal Medicine

## 2019-01-31 ENCOUNTER — Other Ambulatory Visit: Payer: Self-pay

## 2019-01-31 DIAGNOSIS — R5381 Other malaise: Secondary | ICD-10-CM | POA: Diagnosis not present

## 2019-01-31 DIAGNOSIS — S81801A Unspecified open wound, right lower leg, initial encounter: Secondary | ICD-10-CM | POA: Diagnosis not present

## 2019-01-31 DIAGNOSIS — R531 Weakness: Secondary | ICD-10-CM | POA: Diagnosis not present

## 2019-01-31 DIAGNOSIS — I890 Lymphedema, not elsewhere classified: Secondary | ICD-10-CM | POA: Diagnosis not present

## 2019-01-31 DIAGNOSIS — E11622 Type 2 diabetes mellitus with other skin ulcer: Secondary | ICD-10-CM | POA: Diagnosis not present

## 2019-01-31 DIAGNOSIS — S81802A Unspecified open wound, left lower leg, initial encounter: Secondary | ICD-10-CM | POA: Diagnosis not present

## 2019-02-07 ENCOUNTER — Encounter (HOSPITAL_BASED_OUTPATIENT_CLINIC_OR_DEPARTMENT_OTHER): Payer: PPO | Attending: Internal Medicine | Admitting: Internal Medicine

## 2019-02-07 ENCOUNTER — Other Ambulatory Visit: Payer: Self-pay

## 2019-02-07 DIAGNOSIS — I872 Venous insufficiency (chronic) (peripheral): Secondary | ICD-10-CM | POA: Insufficient documentation

## 2019-02-07 DIAGNOSIS — L97221 Non-pressure chronic ulcer of left calf limited to breakdown of skin: Secondary | ICD-10-CM | POA: Insufficient documentation

## 2019-02-07 DIAGNOSIS — Z9119 Patient's noncompliance with other medical treatment and regimen: Secondary | ICD-10-CM | POA: Diagnosis not present

## 2019-02-07 DIAGNOSIS — I87313 Chronic venous hypertension (idiopathic) with ulcer of bilateral lower extremity: Secondary | ICD-10-CM | POA: Diagnosis not present

## 2019-02-07 DIAGNOSIS — L97822 Non-pressure chronic ulcer of other part of left lower leg with fat layer exposed: Secondary | ICD-10-CM | POA: Diagnosis not present

## 2019-02-07 DIAGNOSIS — I890 Lymphedema, not elsewhere classified: Secondary | ICD-10-CM | POA: Insufficient documentation

## 2019-02-07 DIAGNOSIS — E11622 Type 2 diabetes mellitus with other skin ulcer: Secondary | ICD-10-CM | POA: Diagnosis not present

## 2019-02-07 DIAGNOSIS — L97222 Non-pressure chronic ulcer of left calf with fat layer exposed: Secondary | ICD-10-CM | POA: Diagnosis not present

## 2019-02-14 ENCOUNTER — Other Ambulatory Visit: Payer: Self-pay

## 2019-02-14 ENCOUNTER — Encounter (HOSPITAL_BASED_OUTPATIENT_CLINIC_OR_DEPARTMENT_OTHER): Payer: PPO | Admitting: Internal Medicine

## 2019-02-14 DIAGNOSIS — L97212 Non-pressure chronic ulcer of right calf with fat layer exposed: Secondary | ICD-10-CM | POA: Diagnosis not present

## 2019-02-14 DIAGNOSIS — L97812 Non-pressure chronic ulcer of other part of right lower leg with fat layer exposed: Secondary | ICD-10-CM | POA: Diagnosis not present

## 2019-02-14 DIAGNOSIS — I890 Lymphedema, not elsewhere classified: Secondary | ICD-10-CM | POA: Diagnosis not present

## 2019-02-14 DIAGNOSIS — L97219 Non-pressure chronic ulcer of right calf with unspecified severity: Secondary | ICD-10-CM | POA: Diagnosis not present

## 2019-02-21 ENCOUNTER — Encounter (HOSPITAL_BASED_OUTPATIENT_CLINIC_OR_DEPARTMENT_OTHER): Payer: PPO | Admitting: Internal Medicine

## 2019-02-21 ENCOUNTER — Ambulatory Visit: Payer: PPO

## 2019-02-21 ENCOUNTER — Other Ambulatory Visit: Payer: Self-pay

## 2019-02-21 ENCOUNTER — Ambulatory Visit: Payer: PPO | Attending: Internal Medicine

## 2019-02-21 DIAGNOSIS — R2689 Other abnormalities of gait and mobility: Secondary | ICD-10-CM | POA: Insufficient documentation

## 2019-02-21 DIAGNOSIS — S81801A Unspecified open wound, right lower leg, initial encounter: Secondary | ICD-10-CM | POA: Diagnosis not present

## 2019-02-21 DIAGNOSIS — I890 Lymphedema, not elsewhere classified: Secondary | ICD-10-CM | POA: Diagnosis not present

## 2019-02-21 DIAGNOSIS — S81802A Unspecified open wound, left lower leg, initial encounter: Secondary | ICD-10-CM | POA: Diagnosis not present

## 2019-02-21 NOTE — Therapy (Signed)
[place], [place], [address] Phone: [phone]   Fax:  [phone]  Physical Therapy Evaluation  Patient Details  Name: [name] [name] MRN: [id number] Date of Birth: January 14, 1946 Referring Provider (PT): Darci Current   Encounter Date: 02/21/2019  PT End of Session - 02/21/19 1647    Visit Number  1    Number of Visits  25    Date for PT Re-Evaluation  03/28/19    PT Start Time  [id number]    PT Stop Time  1630    PT Time Calculation (min)  45 min    Activity Tolerance  Patient tolerated treatment well    Behavior During Therapy  [facility] for tasks assessed/performed       Past Medical History:  Diagnosis Date  . Acute kidney injury ([place])   . Aortic stenosis, mild 11/17/2013  . Arthritis    "both knees, spine, back, fingers" (11/29/2013)  . CHF (congestive heart failure) ([place])   . Edema   . GERD (gastroesophageal reflux disease)   . Heart murmur   . History of diastolic dysfunction    Echo [id number] with diastolic dysfunction  . HTN (hypertension)   . Morbid obesity ([place])   . OA (osteoarthritis)   . PAC (premature atrial contraction)    per prior Holter  . Palpitations   . Pneumonia    "as a child"  . PVC's (premature ventricular contractions)    per prior Holter  . SVT (supraventricular tachycardia) ([place])   . Tachycardia    noted at 07/28/11 visit. Started on beta blocker. Possible atrial flutter vs long PR tachycardia/AVNRT  . Type II diabetes mellitus ([place])     Past Surgical History:  Procedure Laterality Date  . CESAREAN SECTION  1985  . CESAREAN SECTION  1985  . SUPRAVENTRICULAR TACHYCARDIA ABLATION  11/29/2013  . SUPRAVENTRICULAR TACHYCARDIA ABLATION N/A 11/29/2013   Procedure: SUPRAVENTRICULAR TACHYCARDIA ABLATION;  Surgeon: Evans Lance, MD;  Location: [facility] CATH LAB;  Service: Cardiovascular;  Laterality: N/A;  . US ECHOCARDIOGRAPHY  07/25/2008   EF 55-60%  . VAGINAL DELIVERY       There were no vitals filed for this visit.   Subjective Assessment - 02/21/19 1607    Subjective  Pt reports that she has had swelling in her legs for years. She states that she has had osteoarthritis in her Bil knees for years which she thinks is causing some of her swelling. She has a Juxtalite for just the lower legs at home and a pump that only goes above the knees.    Pertinent History  osteo arthritis, hx cellulitis, phlebitis and history of edema in BLE, Hx Acute kidney injury, CHF and DM II    Patient Stated Goals  I want to get this swelling down and heal my wound.    Currently in Pain?  Yes    Pain Score  6     Pain Location  Knee    Pain Orientation  Left;Right    Pain Descriptors / Indicators  Aching    Pain Type  Chronic pain    Pain Onset  More than a month ago    Pain Frequency  Intermittent    Aggravating Factors   movement    Pain Relieving Factors  rest         [facility] PT Assessment - 02/21/19 0001      Assessment   Medical Diagnosis  Bil LE lymphedema     Referring  Provider (PT)  Darci Current    Next MD Visit  03/07/2019      Balance Screen   Has the patient fallen in the past 6 months  No    Has the patient had a decrease in activity level because of a fear of falling?   Yes    Is the patient reluctant to leave their home because of a fear of falling?   No      Home Environment   Living Environment  Private residence    Living Arrangements  Alone    Type of [place]      Prior Function   Level of [place]  Retired    Leisure  I like to read and travel with a group to different outings and going to church.       Cognition   Overall Cognitive Status  Within Functional Limits for tasks assessed        LYMPHEDEMA/ONCOLOGY QUESTIONNAIRE - 02/21/19 1613      What other symptoms do you have   Are you Having Heaviness or Tightness  Yes    Are you having Pain  Yes    Are you having pitting edema  No    Is it  Hard or Difficult finding clothes that fit  Yes    Do you have infections  Yes    Comments  --   cellulitis 2x      Lymphedema Assessments   Lymphedema Assessments  Lower extremities      Left Lower Extremity Lymphedema   20 cm Proximal to Suprapatella  76.5 cm    10 cm Proximal to Suprapatella  66 cm    At Midpatella/Popliteal Crease  64 cm    30 cm Proximal to Floor at Lateral Plantar Foot  52.6 cm    10 cm Proximal to Floor at Lateral Malleoli  32 cm    Circumference of ankle/heel  35 cm.    5 cm Proximal to 1st MTP Joint  24 cm    Across MTP Joint  24.8 cm    Around Proximal Great Toe  9.7 cm             Objective measurements completed on examination: See above findings.      [place] Adult PT Treatment/Exercise - 02/21/19 0001      Manual Therapy   Manual Therapy  Compression Bandaging    Compression Bandaging  Pt was wrapped with 2 artiflex for shaping and 1 8 cm roman sandal, 2 10 cm wraps 1 ankle sole heel and the then spiral up the lower leg to the knee this session due to pt does not think she can walk with above the knee wrapping at this time. Toes were not wrapped and she was wrapped very close to the digits with education on what to look for for toe swelling to decrease risk of edema in the toes. Pt was wrapped very loosely this session for first wrapping with ironing and no pull.               PT Education - 02/21/19 1645    Education Details  Pt was educated on reasons to the remove the wrap including aching, pain, numbness, tingling that does not resolve with movement or removing the top bandage. She was educated on the anatomy and physiology of the lymphatic system as well as CDT including skin care, compression, exercise and manual lymph drainage. Discussed the  importance of getting some compression shorts and a compression pump that work higher than the knee due to she has edema in her thigh and educated on the importance of clearing out the thigh before  the lower leg can clear out lymphatic fluid.    Person(s) Educated  Patient    Methods  Explanation;Handout    Comprehension  Verbalized understanding       PT Short Term Goals - 02/21/19 1654      PT SHORT TERM GOAL #1   Title  Pt will be independent with HEP    Baseline  Pt does not have an HEP    Time  6    Period  Weeks    Status  New    Target Date  04/11/19        PT Long Term Goals - 02/21/19 1654      PT LONG TERM GOAL #1   Title  Patient will have reduction of L lower leg limb girth by 3 cm    Baseline  See measurements    Time  12    Period  Weeks    Status  New    Target Date  05/23/19      PT LONG TERM GOAL #2   Title  Patient to be properly fitted with compression garment to wear on daily basis.    Baseline  Pt is unable to fit her compression garments and has no compression for the thighs.    Time  12    Period  Weeks    Status  New    Target Date  05/23/19      PT LONG TERM GOAL #3   Title  Patient will be independent in self-care management principles including self-massage/bandaging and long term management plan for edema.    Baseline  Pt has unmanaged lymphedema that results in chronic wound    Time  12    Period  Weeks    Status  New    Target Date  05/23/19             Plan - 02/21/19 1648    Clinical Impression Statement  Pt presents to physical therapy with significant edema in her Bil LE and less edema but lymphedem present in her Bil thighs as demonstrated by fibrosis, hardening of the skin and assymmetrical appearance. Pt was unaware of lymphedema and has been taking fluid pills for years that were not helping clearing the edema out of her legs due to protein rich fluid from lymphedema is unable to clear out using fluid pills. Pt has osteoarthritis in her Bil knees which contribute to increased soft edema in the area and to lymphatic overload due to decreased mobility related to pain. Pt was wrapped today to the knee using very little  compression due to co-morbidities and education on what to assess to contact MD due to CHF exacerbation or remove wrap due to numbness, tingling, pain or swelling in the toes. Pt will benefit from skilled physical therapy services in order to address the above limitations.    Personal Factors and Comorbidities  Age;Comorbidity 3+    Comorbidities  CHF, acute kidney injury, DM II    Stability/Clinical Decision Making  Stable/Uncomplicated    Clinical Decision Making  Low    Rehab Potential  Good    PT Frequency  2x / week    PT Duration  12 weeks    PT Treatment/Interventions  Electrical Stimulation;Cryotherapy;Gait training;Stair training;Functional mobility training;Therapeutic activities;Therapeutic  exercise;Balance training;Neuromuscular re-education;Patient/family education;Manual lymph drainage;Compression bandaging;Passive range of motion;Taping;Vasopneumatic Device    PT Next Visit Plan  Assess wrap, give exercises    Consulted and Agree with Plan of Care  Patient       Patient will benefit from skilled therapeutic intervention in order to improve the following deficits and impairments:  Difficulty walking, Increased edema  Visit Diagnosis: Lymphedema  Other abnormalities of gait and mobility     Problem List Patient Active Problem List   Diagnosis Date Noted  . Postnasal drip 11/16/2017  . Sensorineural hearing loss (SNHL), bilateral 11/16/2017  . Tinnitus of both ears 11/16/2017  . Obesity, Class II, BMI 35-39.9, isolated (see actual BMI) 11/08/2017  . CKD (chronic kidney disease), stage III 11/08/2017  . Anemia 11/07/2017  . Thrombocytosis ([place]) 11/07/2017  . Lymphedema 11/07/2017  . AKI (acute kidney injury) ([place]) 10/16/2017  . Routine general medical examination at a health care facility 05/10/2014  . Paroxysmal SVT (supraventricular tachycardia) ([place]) 11/28/2013  . Physical deconditioning 11/23/2013  . Generalized weakness 11/17/2013  . Nausea 11/17/2013  .  Multilevel spine pain 11/17/2013  . Aortic stenosis, mild 11/17/2013  . Acute lower UTI 11/17/2013  . Cellulitis of left lower extremity 11/17/2013  . DM type 2 (diabetes mellitus, type 2) ([place]) 02/02/2013  . Arthritis 10/19/2012  . Mixed incontinence 10/19/2012  . GERD (gastroesophageal reflux disease) 04/20/2011  . HTN (hypertension) 01/20/2011  . Morbid obesity ([place])     Ander Purpura, PT 02/21/2019, 4:57 PM  [place], [place], [address] Phone: [phone]   Fax:  [phone]  Name: [name] [name] MRN: [id number] Date of Birth: 02/15/1946

## 2019-02-21 NOTE — Patient Instructions (Signed)
PLEASE KEEP YOUR BANDAGES ON AS LONG AS POSSIBLE TO GET THE BEST SWELLING REDUCTION. Should your bandages become uncomfortable or feel too tight, follow these steps: 1. Elevate your extremity higher than your heart.  2. Try to move your arm or leg joints against the firmness of the bandage to help with moving the fluid and allow the bandages to loosen a bit.  3. If the bandaging is still is too tight, it is ok to carefully remove the top layer.  There will still be more layers under it that can provide compression to your extremity. 4. Finally, if you STILL have significant pain after trying these steps, it is ok to take the bandage off.  Check your skin carefully for any signs of irritation  5. PLEASE bring ALL bandage materials back to your next appointment as we will reuse what we can TAKE CARE OF YOUR BANDAGES SO THEY WILL LAST LONGER AND STAY IN BETTER CONDITION Washing bandages:  Wash periodically using a mild detergent in warm water.  Do not use fabric softener or bleach.  Place bandages in a mesh lingerie bag or in a tied off pillow case and use the gentle cycle of the washing machine or hand wash. If you hand wash, you may want to put them in the spin cycle of your washer to get the extra water out, but make sure you put them in a mesh bag first. Do not wring or stretch them while they are wet.  Drying bandages: Lay the bandages out smoothly on a towel away from direct sunlight or heating sources that can damage the fabric. Rolling bandages in a towel and gently squeezing the towel to remove excess water before laying them out can speed up the process.  If you use a drying rack, place a towel on top of the rack to lay the bandages on.  If they hang down to dry, they fabric could be stretched out and the bandage will lose its compression.   Or, keep bandages in the mesh bag and dry them in the dryer on the low or no heat cycle. Rolling bandages: Please roll your bandages after drying them so they  are ready for your next treatment. If they are rolled too loose, they will be difficult to apply.  If rolled too tight, they can get stretched out.   TAKE CARE OF YOUR SKIN 1. Apply a low pH moisturizing lotion to your skin daily 2. Avoid scratching your skin 3. Treat skin irritations quickly  4. Know the 5 warning signs of infection: redness, pain, warmth to touch, fever and increased swelling.  Call your physician immediately if you notice any of these signs of a possible infection.  

## 2019-02-23 ENCOUNTER — Ambulatory Visit: Payer: PPO

## 2019-02-23 ENCOUNTER — Other Ambulatory Visit: Payer: Self-pay

## 2019-02-23 DIAGNOSIS — I890 Lymphedema, not elsewhere classified: Secondary | ICD-10-CM | POA: Diagnosis not present

## 2019-02-23 DIAGNOSIS — R2689 Other abnormalities of gait and mobility: Secondary | ICD-10-CM

## 2019-02-23 NOTE — Therapy (Signed)
[place], [place], [address] Phone: [phone]   Fax:  [phone]  Physical Therapy Treatment  Patient Details  Name: [name] [name] MRN: [id number] Date of Birth: Nov 26, 1945 Referring Provider (PT): Darci Current   Encounter Date: 02/23/2019  PT End of Session - 02/23/19 1428    Visit Number  2    Number of Visits  25    Date for PT Re-Evaluation  03/28/19    PT Start Time  1430    PT Stop Time  1515    PT Time Calculation (min)  45 min    Activity Tolerance  Patient tolerated treatment well    Behavior During Therapy  [facility] for tasks assessed/performed       Past Medical History:  Diagnosis Date  . Acute kidney injury ([place])   . Aortic stenosis, mild 11/17/2013  . Arthritis    "both knees, spine, back, fingers" (11/29/2013)  . CHF (congestive heart failure) ([place])   . Edema   . GERD (gastroesophageal reflux disease)   . Heart murmur   . History of diastolic dysfunction    Echo [id number] with diastolic dysfunction  . HTN (hypertension)   . Morbid obesity ([place])   . OA (osteoarthritis)   . PAC (premature atrial contraction)    per prior Holter  . Palpitations   . Pneumonia    "as a child"  . PVC's (premature ventricular contractions)    per prior Holter  . SVT (supraventricular tachycardia) ([place])   . Tachycardia    noted at 07/28/11 visit. Started on beta blocker. Possible atrial flutter vs long PR tachycardia/AVNRT  . Type II diabetes mellitus ([place])     Past Surgical History:  Procedure Laterality Date  . CESAREAN SECTION  1985  . CESAREAN SECTION  1985  . SUPRAVENTRICULAR TACHYCARDIA ABLATION  11/29/2013  . SUPRAVENTRICULAR TACHYCARDIA ABLATION N/A 11/29/2013   Procedure: SUPRAVENTRICULAR TACHYCARDIA ABLATION;  Surgeon: Evans Lance, MD;  Location: [facility] CATH LAB;  Service: Cardiovascular;  Laterality: N/A;  . US ECHOCARDIOGRAPHY  07/25/2008   EF 55-60%  . VAGINAL DELIVERY       There were no vitals filed for this visit.  Subjective Assessment - 02/23/19 1428    Subjective  Pt reports that she has no adverse reactions from the bandage and was able to leave it on. She states she has no increase in pain. She states that she was contacted by tactile medical    Pertinent History  osteo arthritis, hx cellulitis, phlebitis and history of edema in BLE, Hx Acute kidney injury, CHF and DM II    Patient Stated Goals  I want to get this swelling down and heal my wound.    Currently in Pain?  Yes    Pain Score  6     Pain Location  Knee    Pain Orientation  Right;Left    Pain Descriptors / Indicators  Aching    Pain Type  Chronic pain    Pain Onset  More than a month ago    Pain Frequency  Intermittent    Aggravating Factors   movement    Pain Relieving Factors  rest                       [facility] Adult PT Treatment/Exercise - 02/23/19 0001      Manual Therapy   Manual Therapy  Manual Lymphatic Drainage (MLD);Compression Bandaging    Manual Lymphatic Drainage (MLD)  Pt was educated on self MLD to the knee in sitting due to pt has difficulty getting in/out of lying position. Swimming in the terminus, Bil axillary nodes, Bil inguinal nodes, Bil inguino-axillary anastomosis, Bil atnerior thighs from knee to groin, Medial/lateral Bil thighs, Bil lateral thighs, Bil inguino-axillary anastomosis, BIl axillary nodes, Bil inguinal nodes.     Compression Bandaging  Pt was wrapped with 2 artiflex for shaping and 1 8 cm roman sandal, 2 10 cm wraps 1 for ankle sole heel and then spiral up the lower leg to the knee this session. Pt was provided with tubigrip up to the groin for light compression for the knee and thigh. Discussed the impotance of not letting this bunch at the knee to prevent obstruction to the lymphatic system. Pt toes were wrapped this session with 2inch elasomull folded for digits 1-3 due to slight increase in circumference on visual inspection after no  wraps last session. Pt stated that this was tolerable. Increased pull this session on wraps due to no adverse reaction last session. ABD pads were placed over wound dressing due to leakage.              PT Education - 02/23/19 1533    Education Details  Pt was provided with MLD instruction with an emphasis on pressure and direction with demonstration and return demonstration using hand over hand tactile cues and verbal cues with explanation on the anatomy/physiology of the lymphatic system. Pt was educated on the use of tubigrip up to the thigh with larger size given to decrease risk for constriction and discussed the importance of not letting it bunch at the knee.    Person(s) Educated  Patient    Methods  Explanation;Demonstration;Tactile cues;Handout;Verbal cues    Comprehension  Verbalized understanding;Returned demonstration       PT Short Term Goals - 02/21/19 1654      PT SHORT TERM GOAL #1   Title  Pt will be independent with HEP    Baseline  Pt does not have an HEP    Time  6    Period  Weeks    Status  New    Target Date  04/11/19        PT Long Term Goals - 02/21/19 1654      PT LONG TERM GOAL #1   Title  Patient will have reduction of L lower leg limb girth by 3 cm    Baseline  See measurements    Time  12    Period  Weeks    Status  New    Target Date  05/23/19      PT LONG TERM GOAL #2   Title  Patient to be properly fitted with compression garment to wear on daily basis.    Baseline  Pt is unable to fit her compression garments and has no compression for the thighs.    Time  12    Period  Weeks    Status  New    Target Date  05/23/19      PT LONG TERM GOAL #3   Title  Patient will be independent in self-care management principles including self-massage/bandaging and long term management plan for edema.    Baseline  Pt has unmanaged lymphedema that results in chronic wound    Time  12    Period  Weeks    Status  New    Target Date  05/23/19  Plan - 02/23/19 1428    Clinical Impression Statement  Pt presents with visible reduction in size. No measurements taken this session. Significant weeping from bandage required ABD pads placed over wound clinic bandage to prevent further weeping onto her lymphatic compression wrap. Pt was provided with thigh high tubigrip with the larger size for light compression on the L LE from foot to groin. Toes were wrapped this session due to slight increase in size from last session without deformity or blistering. Pt performed self MLD with demonstration and instruction prior to wrapping. Pt was wrapped form the foot to the knee due to mobility issues. Discussed purchasing compression shorts which she states she will do next week. Pt will benefit from continued POC.    Personal Factors and Comorbidities  Age;Comorbidity 3+    Comorbidities  CHF, acute kidney injury, DM II    PT Frequency  2x / week    PT Duration  12 weeks    PT Treatment/Interventions  Electrical Stimulation;Cryotherapy;Gait training;Stair training;Functional mobility training;Therapeutic activities;Therapeutic exercise;Balance training;Neuromuscular re-education;Patient/family education;Manual lymph drainage;Compression bandaging;Passive range of motion;Taping;Vasopneumatic Device    PT Next Visit Plan  Assess wrap, give exercises, assess self MLD and use of tubigrip to the thigh    Recommended Other Services  compression pants, velcro wraps, vasopneumatic pump that goes higher than the thigh    Consulted and Agree with Plan of Care  Patient       Patient will benefit from skilled therapeutic intervention in order to improve the following deficits and impairments:  Difficulty walking, Increased edema  Visit Diagnosis: Lymphedema  Other abnormalities of gait and mobility     Problem List Patient Active Problem List   Diagnosis Date Noted  . Postnasal drip 11/16/2017  . Sensorineural hearing loss (SNHL), bilateral  11/16/2017  . Tinnitus of both ears 11/16/2017  . Obesity, Class II, BMI 35-39.9, isolated (see actual BMI) 11/08/2017  . CKD (chronic kidney disease), stage III 11/08/2017  . Anemia 11/07/2017  . Thrombocytosis ([place]) 11/07/2017  . Lymphedema 11/07/2017  . AKI (acute kidney injury) ([place]) 10/16/2017  . Routine general medical examination at a health care facility 05/10/2014  . Paroxysmal SVT (supraventricular tachycardia) ([place]) 11/28/2013  . Physical deconditioning 11/23/2013  . Generalized weakness 11/17/2013  . Nausea 11/17/2013  . Multilevel spine pain 11/17/2013  . Aortic stenosis, mild 11/17/2013  . Acute lower UTI 11/17/2013  . Cellulitis of left lower extremity 11/17/2013  . DM type 2 (diabetes mellitus, type 2) ([place]) 02/02/2013  . Arthritis 10/19/2012  . Mixed incontinence 10/19/2012  . GERD (gastroesophageal reflux disease) 04/20/2011  . HTN (hypertension) 01/20/2011  . Morbid obesity ([place])     Ander Purpura, PT 02/23/2019, 3:39 PM  [place], [place], [address] Phone: [phone]   Fax:  [phone]  Name: [name] [name] MRN: [id number] Date of Birth: 1946/01/28

## 2019-02-27 NOTE — Progress Notes (Signed)
[name], [name] ([id number]) Visit Report for 02/21/2019 HPI Details Patient Name: Date of Service: [name] [name], [name] [name] 02/21/2019 1:45 PM Medical Record [id number] Patient Account Number: [id number] Date of Birth/Sex: Treating RN: 1946/02/19 (73 y.o. [name] Primary Care Provider: [name] Other Clinician: Referring Provider: Treating Provider/Extender:Stepen Prins, Tenna Child, [place] Siren in Treatment: 6 History of Present Illness HPI Description: 04/09/15; the patient is here for review of wounds on her bilateral lower extremities for the last month. The patient has a history of lymphedema and bilateral chronic venous insufficiency with inflammation she was here for review of wounds on her bilateral legs in 2014 she was discharged home with external compression pumps which she is currently not using. Over the last month she developed wounds on her bilateral lower legs with drainage and pain and she is here for our review of this. 04/16/15; the patient's edema is under much better control. She has 3 areas one on the right anterior medial leg one on the left posterior leg and a small open area with purulent drainage on the left anterior leg. I have cultured the area on the left anterior leg 04/23/15; continued improvement in the patient's edema. All her wounds of closed I see no open areas. Culture from the left anterior leg last time was negative. READMISSION 02/07/16; this patient is a woman that I saw her earlier in 2017. I believe we discharged her very quickly with bilateral wounds in her legs secondary to chronic venous hypertension with inflammation and secondary lymphedema. She had juxtalite stockings. She has external compression pumps at home from a stay in our clinic in 2014. She tells me that she's had wounds on her bilateral lower legs since March or April of this year. 3 wounds on the left 2 on the right. She is a type II diabetic and has a history of  noncompressible vessels bilaterally although she is tolerated 4-layer compression. The last note from her primary physician Dr. [name] was in May. At that point Dr. [name] had referred the patient here for lymphedema management. The patient stated she made a phone call to year but was not able to arrange an appointment. It is not clear that she is actually using anything on the wounds currently except some gauze that was saturated with drainage per our intake nurse. She is certainly not using her juxtalite stockings and by the patient's own admission she is not using her compression pumps because they are "irritating". I don't see a recent hemoglobin A1c. The patient states she has not been systemically unwell but the legs are painful 02/14/16; patient tolerated her Profore wraps reasonably well. She is using the compression pumps 1 time per day. Measurements of her legs are better in terms of the amount of lymphedema 02/21/16; patient tolerated her Profore wraps it. She says she is using her compression pumps one or 2 times a day. Measurements of her leg circumference are improved and most of her wound dimensions are better, unfortunately the home health that we referred her to did not except her insurance but works successful in getting her accompanied that would except her insurance however she apparently has a $25 co-pay which she cannot afford as she is on a fixed income 03/05/16; patient states she did not back using the pumps. She is healed that 2 wounds on the left leg. She still has areas on the right lateral lower leg, right medial lower leg and left lateral lower leg. All of these look some better and have  reduced in area 03/13/16; still 3 wounds 1 on the left lateral leg, one on the left medial/posterior leg which is really the most extensive and a small area on the right lateral leg. All of this in the setting of severe chronic lymphedema and chronic  venous inflammation. Damage to the skin with cobblestone thickened skin. 03/20/16; still 2 areas. The area on the left lateral leg appears to of closed over. The right medial leg is still most extensive wound although it appears to be closing over right lateral only a small open area remains. Using silver alginate Profore wraps 03/27/16; the area on the left leg remains closed. The edema control is better on the left than the right. The right medial leg is still weeping but everything appears to be epithelialized. The lateral aspect of the right leg appears closed. She tells me she has old juxtalite stockings. She has been compliant with her compression pumps. 04/03/16; the area on the left leg remains closed. She has ordered new juxtalite stockings. The right posterior medial wound is fully epithelialized. However she has an irritated area over the right Achilles that she states is been itching all week under the wraps.. She has been compliant with her compression pumps 04/10/16; the area on the left leg remains closed. The right posterior medial wound is also closed today. The irritated area over the right Achilles which was probably wrap injury is also fully epithelialized and closed. The patient has not received her new juxtalite stockings, she arrives in clinic today with an old juxtalite stocking in place. Sheis using her compression pumps secondary to her severe stage III lymphedema READMISSION 01/21/17; this is a patient we know from several previous stays in this clinic. She has lymphedema with chronic venous insufficiency and inflammation. The last time she was here we discharged her with bilateral juxta lites which she is compliant with. She also has external compression pumps at home from stays in our clinic in 2014 although she tells me that she is not very compliant with these as when she uses them a causes pain in her bilateral knees the patient states that her wounds on her left  greater than right leg including 2 large areas on the left anterior and left lateral leg and an area on the right leg open in late June or July since then she is been followed by [facility] and she's had bilateral Unna boot supplied every week for the last month.. I think the wounds have actually improved although they are not specifically dressed. She states she had an ultrasound to rule out DVT bilaterally that was negative. She does not have arterial studies although she is a diabetic. She states she has a lot of pain in her legs she states she does not use her external compression pumps because it causes pain in her knees. As usual her ABIs were noncompressible bilaterally in this clinic 01/28/17; currently the patient only has one small wound on the left lateral leg and I think this should be healed next week. We are going to try to get her bilateral extremitease stockings. She tells me that compression pumps heard her posterior rib arthritic knees so she is not been compliant with this 02/04/17; the patient's wounds are totally healed. She has chronic venous insufficiency and secondary lymphedema. She has new extremitease stockings. She also has compression pumps Readmission: 04/21/17 on evaluation today patient appears to be doing about the same as what she was previous when we were evaluating  her back in November 2018. At that point she had completely healed. With that being said she has now reopened and states that she wasn't aware that she was supposed to come back. 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 Subsequently she also states that although she has the compression that we got for her previously the extremitease stockings she nonetheless doesn't always wear these and being noncompliant often has things will reopen as far as ulcers on her lower extremities. No fevers,  chills, nausea, or vomiting noted at this time. Patient has no evidence of dementia. She is having some discomfort in regard to the ulcers at this point. 04/28/17 on evaluation today patient appears to be doing significantly better in regard to her wounds. With that being said she is still having significant discomfort and is not really keen on sharp debridement. She does not appear to have any evidence of infection which is good news I do think the Iodoflex is beneficial for her. Of note her ABI's were found in the system to be on the right 1.21 with a TBI of 0.76 and on the left 1.35 with a TBI of 0.75. In general her swelling appears to be significantly improved however. No fevers, chills, nausea, or vomiting noted at this time. Patient has been tolerating the wraps without complication. She has no evidence of dementia. 05/05/17-she is here in follow-up evaluation for bilateral lower extremity venous and lymphedema ulcers. She states she has not been using her lymphedema pumps secondary to pain at her knees. After discussion it was noted that she uses knee-high lymphedema pumps but does have a pair of thigh-high lymphedema pumps. She was strongly advised to use the thigh-high lymphedema pumps and if necessary reduce the setting for improved tolerance. She reports no change in odor and/or drainage. She continues to tolerate sharp debridement poorly with significant pain, primarily to the right lower extremity. We will continue with current treatment plan, along with improved lymphedema pump use and follow-up next week 05/12/17 on evaluation today patient actually appears to be doing much better in regard to her bilateral lower extremity ulcers. Only the right altar actually requires debridement today. The other two cleaned off nicely in two of the three in fact on the left lower extremity or actually almost completely healed. She does have some weeping noted still the bilateral lower extremities left  greater than right. She still is not able to use her compression pumps even though I have mentioned this several times to her as did Leah last week. No fevers, chills, nausea, or vomiting noted at this time. Patient has been tolerating the dressing changes without complication. Patient has no evidence of dementia 05/19/17 on evaluation today patient appears to be doing very well regard to her bilateral lower extremity ulcers. In fact she seems to be improving and in fact healed in a couple of locations that were giving her trouble last week. Overall I'm extremely pleased with how things have progressed. She still has some discomfort especially in the right lateral lower extremity. Fortunately there does not appear to be any evidence of significant infection which is good news she does tell me that she has used her compression pumps several times although not routinely over the past week I did praise her for this. I do think it will be beneficial and that might even be what's helping with her wounds as far  as healing is concerned at least to a degree. No fevers, chills, nausea, or vomiting noted at this time. Patient has no dementia and no evidence of infection. 05/26/17 on evaluation today patient presents for evaluation concerning her bilateral lower extremity ulcer secondary to lymphedema. Fortunately she appears to be doing very well at this point in time. Specifically her right leg appears to be completely closed and healed. The left leg though there is still a small area of opening overall has healed. She does have a little bit of excoriation at the top or the wraps are because they slid down on her. Otherwise there's no evidence of infection and the other has improved dramatically now that she is not draining as significantly. Patient is having no significant pain she tells me she has been able to use her lymphedema pumps one time a day although that's not all she can tolerate. Even that is  difficult for her. 06/02/17 on evaluation today patient's ulcers appear to be completely closed at this point. She does still note some odor but I believe this is due to the fact she has not been able to wash her legs as well currently. Fortunately she did bring her compression with her today. She does have the EXTREMIT-EASE Compression. With that being said she is somewhat nervous about going back to this as previously she broke out yet again once we discontinue wrapping her. READMISSION 11/23/17; patient is now 73 year old diabetic woman with severe chronic venous insufficiency with lymphedema. We care for her last in this clinic in February 2019 at which time she had chronic venous insufficiency with lymphedema bilateral lower extremity ulcers. We discharged her with bilateral wrap around/extremitease stockings and compression pumps. Is fairly clear she is not using the compression pumps stating it causes pain in her knees. In any case she was admitted to hospital from 8/3 through 8/6 with a several week history of recurrent bilateral ulcers. She was felt to have bilateral cellulitis treated with doxycycline IV. She is still on oral doxycycline and has another 2 days worth. At discharge her white count was 14.1 hemoglobin at 8.9. The patient is a diabetic however her last ABIs in 2018 showed a right ABI of 1.21, left of 1.35. TBI's were 0.76 on the right 0.75 on the left. Posterior tibial artery was monophasic on the right triphasic on the left. Dorsalis pedis triphasic on the right and triphasic on the left. She is not felt to have significant macrovascular disease 12/02/17;significant bilateral lower extremity wounds secondary to severe uncontrolled lymphedema. She did not get home health out to change the wraps. So she kept this on all week. She is telling me she using his her compression pumps once a week. In general all the wounds look better. We've been using silver alginate with secondary  absorptive dressings 4 layer compression 12/10/17; bilateral lower extremity wounds secondary to uncontrolled lymphedema. She tells me she is using her compression pumps on most days. We've been keeping wraps on all week. Using silver alginate. She has wounds on the right lateral calf left medial and left lateral calf. All of this is somewhat better 12/16/17; the patient has 3 small open areas. One on the right lateral calf, one on the left medial calf and the small area on the left lateral calf. She has lymphedema, chronic venous insufficiency with hemosiderin deposition. Very fibrotic distal lower extremity scan with suggestion of an inverted bottle sign appearance to her legs. She has compression pumps but she does  not use them has not used them in the last week. Currently we have her in 4 layer compression, silver alginate to the wounds and this is being changed once by home health. She is noncompliant with the compression pumps because she says it hurts her knees. She also has compression stockings ando Juxta light stockings at home 12/24/17; the patient's right lateral calf is healed as well as the left medial. There is no open wound on the right leg. She still has 2 areas on the left lateral calf. We transitioned the right leg into an extremitease stocking which she brought in from home. 12/31/17; the patient has a superficial wound on the left lateral lower calf. We've been wrapping this leg. She arrives today with the right leg swollen but without a wound. She cannot get her extremities stocking on the right leg. There is a lot more edema here. She is not using her compression pumps 01/10/2018; patient has a same superficial wound on the left lateral calf. Her right leg has less peripheral edema. She tells me she also started herself on some Lasix that had been previously prescribed for her at some point but she had never taken it. She is also concerned about whether her compression pumps are  leaking. She has extremitease stockings however the edema gets bad enough she can get these on. Currently she has no open wound on the right leg and is mention the edema is actually better than when I saw this 10 days ago 01/17/2018; patient has a same superficial area on the posterior left calf and a weeping area in the mid part of the right lateral calf. She still does not have anyone from [facility] coming to see her compression pumps which she describes as being suspicious for a leakage. I am not really sure how frequently she is using this in any case. She has massive bilateral calf lymphedema. I think there is some spreading lymphedema into her posterior thighs. 01/24/2018; the patient's edema in her legs is actually some better. She still has a smaller area on the left posterior calf although this is better. The weeping area on the right lateral calf is also better. As it turned out she did not get her pumps from [facility] but [facility] and they are going to try to go out and see these this week which is going to help. I explained to her that she will use the compression pumps once a day while we are wrapping her legs but when she transitions to stockings that may be she will require pumping twice a day. Her compliance in this area has not been high and I wonder about her ability to do this herself. 02/01/2018; the area on the posterior left leg is healed. She still has the original wound on the right lateral calf which is epithelialized. The however just above this there is still weeping lymphedema fluid. She has a new area on the upper right calf which is either wrap injury or is she points out where she has been scratching under the wraps. We still not have managed to get a hold of a representative of [facility] to see if the pump is operational. She has extremitease stockings at home however I am not of the opinion that this will maintain her skin  integrity without the compression pumps. 02/08/2018; posterior left leg remains healed although she has a new small open area on the left lateral calf. Right lateral calf has two quarter  sized open areas and close juxtaposition. There is less weeping edema fluid. She apparently managed to contact [facility]. They are sending her a part. But she is still not received it 02/15/2018; the patient has no open wound on either leg. Some scale present on the right upper lateral calf just below the fibular head. Edema control is excellent. She did not bring her stockings or her juxta lite wrap around. She has the part for her compression pump but she has not use the compression pumps yet READMISSION 01/10/2019 Patient is now a 73 year old woman that we have had in the clinic multiple times in the past. She has chronic lower extremity lymphedema, recurrent wounds on her bilateral lower extremities. She is also a type II diabetic reasonably well controlled with a recent hemoglobin A1c while she was in the hospital of 5.9. She has wounds on her bilateral lower extremities mostly on the left lateral and right lateral. These were noted also during her recent hospitalization. The patient states they have been there for a while. She has not been able to wear her stockings he has trouble getting them on as she lives alone. She also has external compression pumps but uses them sparingly because it causes pain in her knees which hurt because of osteoarthritis. The patient was on doxycycline before she went into the hospital apparently for fear of infection in her legs and is on Keflex coming out because of a UTI The patient was recently in the hospital from 01/06/2019 through 01/09/2019. She was admitted with nausea and vomiting and prerenal azotemia. This was corrected with IV fluid her Lasix has been put on hold. Hemoglobin A1c at 5.9 her metformin was discontinued. She was also felt to have a UTI she was  prescribed Keflex at 503 times a day she is picking that up today. The patient is not felt to have an arterial issue. ABIs in our clinic were 1.24 on the right and 1.06 on the left. She had formal studies in 2018 at which time her ABIs were 1.21 on the right TBI of 0.76 and on the left 1.35 and 0.75 respectively. Waveforms are on the right were monophasic and biphasic, triphasic and biphasic on the left. She is tolerated 4 layer compression in the past 10/13. Comes in today having not used her compression pumps /perhaps once in the last week. She has too much edema to consider healing these wounds. We have been using silver alginate 10/20; she tells me he had she used her compression pumps once a day as we asked. Clearly she has edema out of her legs although most of it seems to be settling around her knees which is what she complains about. We have been using silver alginate to the extensive de-epithelialized area on the left lateral calf and a much smaller area on the right lateral extending linearly into the right posterior 10/27; she is using her compression pumps daily. Her edema control is better. We have been using silver alginate to the extensive wound areas on her bilateral lower legs. This includes left anterior left lateral and left posterior. Right anterior right lateral and right posterior. The wounds are not all in the same condition. She has severe bilateral skin damage with the epithelialization, flaking dried epithelium 11/3; she is using her compression pumps 1 times daily. Pretty obvious that her wraps fell down especially on the left leg to mid calf. I think things are improving on the right lateral calf, I do not see  anything open posteriorly. The major areas on the left lateral where she has 3 or 4 deeper wounds in the midst of an entire de-epithelialized area. There is erythema here but no tenderness I think this represents venous stasis rather than cellulitis. I debrided  the areas on the left lateral calf last week but once again they have tightly adherent debris over the top of them which is disappointing 11/10; the patient's major areas on the left lateral calf to 3 wounds with some depth surrounded by a large area of de-epithelialized tissue. There is no infection. We have been using silver alginate under 4-layer compression. On the right lateral she has a more contained wound area that is superficial. Been using silver alginate under 4-layer compression 11/17; the patient's major wound is on the left lateral calf. We have been using silver alginate under compression. There is no open wound per se on the right but it de-epithelialized area on the lateral calf that is weeping edema fluid. I do not see much change here today. Electronic Signature(s) Signed: 02/21/2019 6:07:35 PM By: Linton Ham MD Entered By: Linton Ham on 02/21/2019 15:23:26 -------------------------------------------------------------------------------- Physical Exam Details Patient Name: Date of Service: KEAUNDRA, EATHERTON 02/21/2019 1:45 PM Medical Record [id number] Patient Account Number: [id number] Date of Birth/Sex: Treating RN: 12/20/1945 (73 y.o. [name] Primary Care Provider: [name] Other Clinician: Referring Provider: Treating Provider/Extender:Adream Parzych, Tenna Child, [place] Siren in Treatment: 6 Constitutional Patient is hypertensive.. Pulse regular and within target range for patient.Marland Kitchen Respirations regular, non-labored and within target range.. Temperature is normal and within the target range for the patient.Marland Kitchen Appears in no distress. Respiratory work of breathing is normal. Cardiovascular No evidence of CHF. Pulses are palpable. Edema in the distal lower extremities is better. Musculoskeletal I think she has severe osteoarthritis of the both knees. Tenderness along the lateral joint lines is probably why she has difficulty using  the compression pumps. Integumentary (Hair, Skin) Severe bilateral hemosiderin rest deposition.Marland Kitchen Psychiatric appears at normal baseline. Notes Wound exam;4 open areas on the left lateral calf up from 3 last week. It appears that there is some epithelialization but the area of the epithelialization appears to be expanded posteriorly. Individually the wounds on this area looks better there is no longer nearly as punched out as they were at 1 point. On the right lateral calf there is still weeping lymphedema fluid on the lateral part but no real open area. Electronic Signature(s) Signed: 02/21/2019 6:07:35 PM By: Linton Ham MD Entered By: Linton Ham on 02/21/2019 15:26:31 -------------------------------------------------------------------------------- Physician Orders Details Patient Name: Date of Service: Kever, Kerrigan 02/21/2019 1:45 PM Medical Record [id number] Patient Account Number: [id number] Date of Birth/Sex: Treating RN: 02-07-1946 (73 y.o. [name] Primary Care Provider: [name] Other Clinician: Referring Provider: Treating Provider/Extender:Jocob Dambach, Tenna Child, [place] Siren in Treatment: 6 Verbal / Phone Orders: No Diagnosis Coding ICD-10 Coding Code Description I89.0 Lymphedema, not elsewhere classified L97.221 Non-pressure chronic ulcer of left calf limited to breakdown of skin L97.211 Non-pressure chronic ulcer of right calf limited to breakdown of skin E11.622 Type 2 diabetes mellitus with other skin ulcer Follow-up Appointments Return Appointment in 2 weeks. Nurse Visit: - 1 week Skin Barriers/Peri-Wound Care Barrier cream Moisturizing lotion TCA Cream or Ointment Wound Cleansing May shower and wash wound with soap and water. Primary Wound Dressing Wound #28 Right,Lateral Lower Leg Hydrofera Blue - ready Wound #29 Right,Posterior Lower Leg Hydrofera Blue - ready Wound #31 Right,Distal,Posterior Lower  Leg Hydrofera Blue - ready Wound #32  Left,Anterior Lower Leg Hydrofera Blue - ready Wound #33 Left,Proximal,Lateral Lower Leg Hydrofera Blue - ready Wound #34 Left,Distal,Lateral Lower Leg Hydrofera Blue - ready Secondary Dressing Wound #28 Right,Lateral Lower Leg ABD pad Wound #29 Right,Posterior Lower Leg ABD pad Wound #31 Right,Distal,Posterior Lower Leg ABD pad Wound #32 Left,Anterior Lower Leg ABD pad Wound #33 Left,Proximal,Lateral Lower Leg ABD pad Wound #34 Left,Distal,Lateral Lower Leg ABD pad Edema Control 4 layer compression - Right Lower Extremity - unna boot at top to help secure wrap Other: - left leg: PT to apply lymphedema wraps Electronic Signature(s) Signed: 02/21/2019 6:07:35 PM By: Linton Ham MD Signed: 02/27/2019 2:18:43 PM By: Levan Hurst RN, BSN Entered By: Levan Hurst on 02/21/2019 15:51:16 -------------------------------------------------------------------------------- Problem List Details Patient Name: Date of Service: Snider, Annagrace 02/21/2019 1:45 PM Medical Record [id number] Patient Account Number: [id number] Date of Birth/Sex: Treating RN: [birth year] (73 y.o. Voncille Lo, Rooks Primary Care Provider: [name] Other Clinician: Referring Provider: Treating Provider/Extender:Brennyn Haisley, Tenna Child, [place] Siren in Treatment: 6 Active Problems ICD-10 Evaluated Encounter Code Description Active Date Today Diagnosis I89.0 Lymphedema, not elsewhere classified 01/10/2019 No Yes L97.221 Non-pressure chronic ulcer of left calf limited to 01/10/2019 No Yes breakdown of skin L97.211 Non-pressure chronic ulcer of right calf limited to 01/10/2019 No Yes breakdown of skin E11.622 Type 2 diabetes mellitus with other skin ulcer 01/10/2019 No Yes Inactive Problems Resolved Problems Electronic Signature(s) Signed: 02/21/2019 6:07:35 PM By: Linton Ham MD Entered By: Linton Ham on 02/21/2019  15:22:30 -------------------------------------------------------------------------------- Progress Note Details Patient Name: Date of Service: Joa, Marlenne 02/21/2019 1:45 PM Medical Record [id number] Patient Account Number: [id number] Date of Birth/Sex: Treating RN: 01-22-1946 (73 y.o. [name] Primary Care Provider: [name] Other Clinician: Referring Provider: Treating Provider/Extender:Mazikeen Hehn, Tenna Child, [place] Siren in Treatment: 6 Subjective History of Present Illness (HPI) 04/09/15; the patient is here for review of wounds on her bilateral lower extremities for the last month. The patient has a history of lymphedema and bilateral chronic venous insufficiency with inflammation she was here for review of wounds on her bilateral legs in 2014 she was discharged home with external compression pumps which she is currently not using. Over the last month she developed wounds on her bilateral lower legs with drainage and pain and she is here for our review of this. 04/16/15; the patient's edema is under much better control. She has 3 areas one on the right anterior medial leg one on the left posterior leg and a small open area with purulent drainage on the left anterior leg. I have cultured the area on the left anterior leg 04/23/15; continued improvement in the patient's edema. All her wounds of closed I see no open areas. Culture from the left anterior leg last time was negative. READMISSION 02/07/16; this patient is a woman that I saw her earlier in 2017. I believe we discharged her very quickly with bilateral wounds in her legs secondary to chronic venous hypertension with inflammation and secondary lymphedema. She had juxtalite stockings. She has external compression pumps at home from a stay in our clinic in 2014. She tells me that she's had wounds on her bilateral lower legs since March or April of this year. 3 wounds on the left 2 on the right. She  is a type II diabetic and has a history of noncompressible vessels bilaterally although she is tolerated 4-layer compression. The last note from her primary physician Dr. [name] was in May. At that point Dr. [name] had referred the patient here for  lymphedema management. The patient stated she made a phone call to year but was not able to arrange an appointment. It is not clear that she is actually using anything on the wounds currently except some gauze that was saturated with drainage per our intake nurse. She is certainly not using her juxtalite stockings and by the patient's own admission she is not using her compression pumps because they are "irritating". I don't see a recent hemoglobin A1c. The patient states she has not been systemically unwell but the legs are painful 02/14/16; patient tolerated her Profore wraps reasonably well. She is using the compression pumps 1 time per day. Measurements of her legs are better in terms of the amount of lymphedema 02/21/16; patient tolerated her Profore wraps it. She says she is using her compression pumps one or 2 times a day. Measurements of her leg circumference are improved and most of her wound dimensions are better, unfortunately the home health that we referred her to did not except her insurance but works successful in getting her accompanied that would except her insurance however she apparently has a $25 co-pay which she cannot afford as she is on a fixed income 03/05/16; patient states she did not back using the pumps. She is healed that 2 wounds on the left leg. She still has areas on the right lateral lower leg, right medial lower leg and left lateral lower leg. All of these look some better and have reduced in area 03/13/16; still 3 wounds 1 on the left lateral leg, one on the left medial/posterior leg which is really the most extensive and a small area on the right lateral leg. All of this in the setting of severe chronic  lymphedema and chronic venous inflammation. Damage to the skin with cobblestone thickened skin. 03/20/16; still 2 areas. The area on the left lateral leg appears to of closed over. The right medial leg is still most extensive wound although it appears to be closing over right lateral only a small open area remains. Using silver alginate Profore wraps 03/27/16; the area on the left leg remains closed. The edema control is better on the left than the right. The right medial leg is still weeping but everything appears to be epithelialized. The lateral aspect of the right leg appears closed. She tells me she has old juxtalite stockings. She has been compliant with her compression pumps. 04/03/16; the area on the left leg remains closed. She has ordered new juxtalite stockings. The right posterior medial wound is fully epithelialized. However she has an irritated area over the right Achilles that she states is been itching all week under the wraps.. She has been compliant with her compression pumps 04/10/16; the area on the left leg remains closed. The right posterior medial wound is also closed today. The irritated area over the right Achilles which was probably wrap injury is also fully epithelialized and closed. The patient has not received her new juxtalite stockings, she arrives in clinic today with an old juxtalite stocking in place. Sheis using her compression pumps secondary to her severe stage III lymphedema READMISSION 01/21/17; this is a patient we know from several previous stays in this clinic. She has lymphedema with chronic venous insufficiency and inflammation. The last time she was here we discharged her with bilateral juxta lites which she is compliant with. She also has external compression pumps at home from stays in our clinic in 2014 although she tells me that she is not very compliant with these  as when she uses them a causes pain in her bilateral knees the patient states that her  wounds on her left greater than right leg including 2 large areas on the left anterior and left lateral leg and an area on the right leg open in late June or July since then she is been followed by [facility] and she's had bilateral Unna boot supplied every week for the last month.. I think the wounds have actually improved although they are not specifically dressed. She states she had an ultrasound to rule out DVT bilaterally that was negative. She does not have arterial studies although she is a diabetic. She states she has a lot of pain in her legs she states she does not use her external compression pumps because it causes pain in her knees. As usual her ABIs were noncompressible bilaterally in this clinic 01/28/17; currently the patient only has one small wound on the left lateral leg and I think this should be healed next week. We are going to try to get her bilateral extremitease stockings. She tells me that compression pumps heard her posterior rib arthritic knees so she is not been compliant with this 02/04/17; the patient's wounds are totally healed. She has chronic venous insufficiency and secondary lymphedema. She has new extremitease stockings. She also has compression pumps Readmission: 04/21/17 on evaluation today patient appears to be doing about the same as what she was previous when we were evaluating her back in November 2018. At that point she had completely healed. With that being said she has now reopened and states that she wasn't aware that she was supposed to come back. 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 Subsequently she also states that although she has the compression that we got for her previously the extremitease stockings she nonetheless doesn't always wear these and being noncompliant often has things will reopen as far as ulcers on her lower  extremities. No fevers, chills, nausea, or vomiting noted at this time. Patient has no evidence of dementia. She is having some discomfort in regard to the ulcers at this point. 04/28/17 on evaluation today patient appears to be doing significantly better in regard to her wounds. With that being said she is still having significant discomfort and is not really keen on sharp debridement. She does not appear to have any evidence of infection which is good news I do think the Iodoflex is beneficial for her. Of note her ABI's were found in the system to be on the right 1.21 with a TBI of 0.76 and on the left 1.35 with a TBI of 0.75. In general her swelling appears to be significantly improved however. No fevers, chills, nausea, or vomiting noted at this time. Patient has been tolerating the wraps without complication. She has no evidence of dementia. 05/05/17-she is here in follow-up evaluation for bilateral lower extremity venous and lymphedema ulcers. She states she has not been using her lymphedema pumps secondary to pain at her knees. After discussion it was noted that she uses knee-high lymphedema pumps but does have a pair of thigh-high lymphedema pumps. She was strongly advised to use the thigh-high lymphedema pumps and if necessary reduce the setting for improved tolerance. She reports no change in odor and/or drainage. She continues to tolerate sharp debridement poorly with significant pain, primarily to the right lower extremity. We will continue with current treatment plan,  along with improved lymphedema pump use and follow-up next week 05/12/17 on evaluation today patient actually appears to be doing much better in regard to her bilateral lower extremity ulcers. Only the right altar actually requires debridement today. The other two cleaned off nicely in two of the three in fact on the left lower extremity or actually almost completely healed. She does have some weeping noted still  the bilateral lower extremities left greater than right. She still is not able to use her compression pumps even though I have mentioned this several times to her as did Leah last week. No fevers, chills, nausea, or vomiting noted at this time. Patient has been tolerating the dressing changes without complication. Patient has no evidence of dementia 05/19/17 on evaluation today patient appears to be doing very well regard to her bilateral lower extremity ulcers. In fact she seems to be improving and in fact healed in a couple of locations that were giving her trouble last week. Overall I'm extremely pleased with how things have progressed. She still has some discomfort especially in the right lateral lower extremity. Fortunately there does not appear to be any evidence of significant infection which is good news she does tell me that she has used her compression pumps several times although not routinely over the past week I did praise her for this. I do think it will be beneficial and that might even be what's helping with her wounds as far as healing is concerned at least to a degree. No fevers, chills, nausea, or vomiting noted at this time. Patient has no dementia and no evidence of infection. 05/26/17 on evaluation today patient presents for evaluation concerning her bilateral lower extremity ulcer secondary to lymphedema. Fortunately she appears to be doing very well at this point in time. Specifically her right leg appears to be completely closed and healed. The left leg though there is still a small area of opening overall has healed. She does have a little bit of excoriation at the top or the wraps are because they slid down on her. Otherwise there's no evidence of infection and the other has improved dramatically now that she is not draining as significantly. Patient is having no significant pain she tells me she has been able to use her lymphedema pumps one time a day although that's not  all she can tolerate. Even that is difficult for her. 06/02/17 on evaluation today patient's ulcers appear to be completely closed at this point. She does still note some odor but I believe this is due to the fact she has not been able to wash her legs as well currently. Fortunately she did bring her compression with her today. She does have the EXTREMIT-EASE Compression. With that being said she is somewhat nervous about going back to this as previously she broke out yet again once we discontinue wrapping her. READMISSION 11/23/17; patient is now 73 year old diabetic woman with severe chronic venous insufficiency with lymphedema. We care for her last in this clinic in February 2019 at which time she had chronic venous insufficiency with lymphedema bilateral lower extremity ulcers. We discharged her with bilateral wrap around/extremitease stockings and compression pumps. Is fairly clear she is not using the compression pumps stating it causes pain in her knees. In any case she was admitted to hospital from 8/3 through 8/6 with a several week history of recurrent bilateral ulcers. She was felt to have bilateral cellulitis treated with doxycycline IV. She is still on oral doxycycline and has  another 2 days worth. At discharge her white count was 14.1 hemoglobin at 8.9. The patient is a diabetic however her last ABIs in 2018 showed a right ABI of 1.21, left of 1.35. TBI's were 0.76 on the right 0.75 on the left. Posterior tibial artery was monophasic on the right triphasic on the left. Dorsalis pedis triphasic on the right and triphasic on the left. She is not felt to have significant macrovascular disease 12/02/17;significant bilateral lower extremity wounds secondary to severe uncontrolled lymphedema. She did not get home health out to change the wraps. So she kept this on all week. She is telling me she using his her compression pumps once a week. In general all the wounds look better. We've been  using silver alginate with secondary absorptive dressings 4 layer compression 12/10/17; bilateral lower extremity wounds secondary to uncontrolled lymphedema. She tells me she is using her compression pumps on most days. We've been keeping wraps on all week. Using silver alginate. She has wounds on the right lateral calf left medial and left lateral calf. All of this is somewhat better 12/16/17; the patient has 3 small open areas. One on the right lateral calf, one on the left medial calf and the small area on the left lateral calf. She has lymphedema, chronic venous insufficiency with hemosiderin deposition. Very fibrotic distal lower extremity scan with suggestion of an inverted bottle sign appearance to her legs. She has compression pumps but she does not use them has not used them in the last week. Currently we have her in 4 layer compression, silver alginate to the wounds and this is being changed once by home health. She is noncompliant with the compression pumps because she says it hurts her knees. She also has compression stockings ando Juxta light stockings at home 12/24/17; the patient's right lateral calf is healed as well as the left medial. There is no open wound on the right leg. She still has 2 areas on the left lateral calf. We transitioned the right leg into an extremitease stocking which she brought in from home. 12/31/17; the patient has a superficial wound on the left lateral lower calf. We've been wrapping this leg. She arrives today with the right leg swollen but without a wound. She cannot get her extremities stocking on the right leg. There is a lot more edema here. She is not using her compression pumps 01/10/2018; patient has a same superficial wound on the left lateral calf. Her right leg has less peripheral edema. She tells me she also started herself on some Lasix that had been previously prescribed for her at some point but she had never taken it. She is also concerned  about whether her compression pumps are leaking. She has extremitease stockings however the edema gets bad enough she can get these on. Currently she has no open wound on the right leg and is mention the edema is actually better than when I saw this 10 days ago 01/17/2018; patient has a same superficial area on the posterior left calf and a weeping area in the mid part of the right lateral calf. She still does not have anyone from [facility] coming to see her compression pumps which she describes as being suspicious for a leakage. I am not really sure how frequently she is using this in any case. She has massive bilateral calf lymphedema. I think there is some spreading lymphedema into her posterior thighs. 01/24/2018; the patient's edema in her legs is actually some better. She  still has a smaller area on the left posterior calf although this is better. The weeping area on the right lateral calf is also better. As it turned out she did not get her pumps from [facility] but [facility] and they are going to try to go out and see these this week which is going to help. I explained to her that she will use the compression pumps once a day while we are wrapping her legs but when she transitions to stockings that may be she will require pumping twice a day. Her compliance in this area has not been high and I wonder about her ability to do this herself. 02/01/2018; the area on the posterior left leg is healed. She still has the original wound on the right lateral calf which is epithelialized. The however just above this there is still weeping lymphedema fluid. She has a new area on the upper right calf which is either wrap injury or is she points out where she has been scratching under the wraps. We still not have managed to get a hold of a representative of [facility] to see if the pump is operational. She has extremitease stockings at home however I am not of the opinion  that this will maintain her skin integrity without the compression pumps. 02/08/2018; posterior left leg remains healed although she has a new small open area on the left lateral calf. Right lateral calf has two quarter sized open areas and close juxtaposition. There is less weeping edema fluid. She apparently managed to contact [facility]. They are sending her a part. But she is still not received it 02/15/2018; the patient has no open wound on either leg. Some scale present on the right upper lateral calf just below the fibular head. Edema control is excellent. She did not bring her stockings or her juxta lite wrap around. She has the part for her compression pump but she has not use the compression pumps yet READMISSION 01/10/2019 Patient is now a 73 year old woman that we have had in the clinic multiple times in the past. She has chronic lower extremity lymphedema, recurrent wounds on her bilateral lower extremities. She is also a type II diabetic reasonably well controlled with a recent hemoglobin A1c while she was in the hospital of 5.9. She has wounds on her bilateral lower extremities mostly on the left lateral and right lateral. These were noted also during her recent hospitalization. The patient states they have been there for a while. She has not been able to wear her stockings he has trouble getting them on as she lives alone. She also has external compression pumps but uses them sparingly because it causes pain in her knees which hurt because of osteoarthritis. The patient was on doxycycline before she went into the hospital apparently for fear of infection in her legs and is on Keflex coming out because of a UTI The patient was recently in the hospital from 01/06/2019 through 01/09/2019. She was admitted with nausea and vomiting and prerenal azotemia. This was corrected with IV fluid her Lasix has been put on hold. Hemoglobin A1c at 5.9 her metformin was discontinued. She was  also felt to have a UTI she was prescribed Keflex at 503 times a day she is picking that up today. The patient is not felt to have an arterial issue. ABIs in our clinic were 1.24 on the right and 1.06 on the left. She had formal studies in 2018 at which time her ABIs  were 1.21 on the right TBI of 0.76 and on the left 1.35 and 0.75 respectively. Waveforms are on the right were monophasic and biphasic, triphasic and biphasic on the left. She is tolerated 4 layer compression in the past 10/13. Comes in today having not used her compression pumps /perhaps once in the last week. She has too much edema to consider healing these wounds. We have been using silver alginate 10/20; she tells me he had she used her compression pumps once a day as we asked. Clearly she has edema out of her legs although most of it seems to be settling around her knees which is what she complains about. We have been using silver alginate to the extensive de-epithelialized area on the left lateral calf and a much smaller area on the right lateral extending linearly into the right posterior 10/27; she is using her compression pumps daily. Her edema control is better. We have been using silver alginate to the extensive wound areas on her bilateral lower legs. This includes left anterior left lateral and left posterior. Right anterior right lateral and right posterior. The wounds are not all in the same condition. She has severe bilateral skin damage with the epithelialization, flaking dried epithelium 11/3; she is using her compression pumps 1 times daily. Pretty obvious that her wraps fell down especially on the left leg to mid calf. I think things are improving on the right lateral calf, I do not see anything open posteriorly. The major areas on the left lateral where she has 3 or 4 deeper wounds in the midst of an entire de-epithelialized area. There is erythema here but no tenderness I think this represents venous stasis  rather than cellulitis. I debrided the areas on the left lateral calf last week but once again they have tightly adherent debris over the top of them which is disappointing 11/10; the patient's major areas on the left lateral calf to 3 wounds with some depth surrounded by a large area of de-epithelialized tissue. There is no infection. We have been using silver alginate under 4-layer compression. On the right lateral she has a more contained wound area that is superficial. Been using silver alginate under 4-layer compression 11/17; the patient's major wound is on the left lateral calf. We have been using silver alginate under compression. There is no open wound per se on the right but it de-epithelialized area on the lateral calf that is weeping edema fluid. I do not see much change here today. Objective Constitutional Patient is hypertensive.. Pulse regular and within target range for patient.Marland Kitchen Respirations regular, non-labored and within target range.. Temperature is normal and within the target range for the patient.Marland Kitchen Appears in no distress. Vitals Time Taken: 2:43 PM, Height: 71 in, Weight: 240 lbs, BMI: 33.5, Temperature: 98.2 F, Pulse: 73 bpm, Respiratory Rate: 18 breaths/min, Blood Pressure: 179/83 mmHg. Respiratory work of breathing is normal. Cardiovascular No evidence of CHF. Pulses are palpable. Edema in the distal lower extremities is better. Musculoskeletal I think she has severe osteoarthritis of the both knees. Tenderness along the lateral joint lines is probably why she has difficulty using the compression pumps. Psychiatric appears at normal baseline. General Notes: Wound exam;4 open areas on the left lateral calf up from 3 last week. It appears that there is some epithelialization but the area of the epithelialization appears to be expanded posteriorly. Individually the wounds on this area looks better there is no longer nearly as punched out as they were at 1 point.    ooOn the right lateral calf there is still weeping lymphedema fluid on the lateral part but no real open area. Integumentary (Hair, Skin) Severe bilateral hemosiderin rest deposition.. Wound #28 status is Open. Original cause of wound was Gradually Appeared. The wound is located on the Right,Lateral Lower Leg. The wound measures 7cm length x 8cm width x 0.1cm depth; 43.982cm^2 area and 4.398cm^3 volume. There is Fat Layer (Subcutaneous Tissue) Exposed exposed. There is no tunneling or undermining noted. There is a large amount of purulent drainage noted. The wound margin is flat and intact. There is large (67-100%) pink granulation within the wound bed. There is a small (1-33%) amount of necrotic tissue within the wound bed including Adherent Slough. Wound #29 status is Open. Original cause of wound was Gradually Appeared. The wound is located on the Right,Posterior Lower Leg. The wound measures 0cm length x 0cm width x 0cm depth; 0cm^2 area and 0cm^3 volume. There is no tunneling or undermining noted. There is a none present amount of drainage noted. The wound margin is flat and intact. There is no granulation within the wound bed. There is no necrotic tissue within the wound bed. Wound #31 status is Open. Original cause of wound was Gradually Appeared. The wound is located on the Right,Distal,Posterior Lower Leg. The wound measures 6cm length x 4cm width x 0.1cm depth; 18.85cm^2 area and 1.885cm^3 volume. There is Fat Layer (Subcutaneous Tissue) Exposed exposed. There is no tunneling or undermining noted. There is a large amount of purulent drainage noted. The wound margin is flat and intact. There is medium (34-66%) red granulation within the wound bed. There is a small (1-33%) amount of necrotic tissue within the wound bed including Adherent Slough. Wound #32 status is Open. Original cause of wound was Gradually Appeared. The wound is located on the Left,Anterior Lower Leg. The wound  measures 3cm length x 3cm width x 0.1cm depth; 7.069cm^2 area and 0.707cm^3 volume. There is Fat Layer (Subcutaneous Tissue) Exposed exposed. There is no tunneling or undermining noted. There is a large amount of serous drainage noted. The wound margin is flat and intact. There is large (67-100%) red, pink granulation within the wound bed. There is a small (1-33%) amount of necrotic tissue within the wound bed including Adherent Slough. Wound #33 status is Open. Original cause of wound was Gradually Appeared. The wound is located on the Left,Proximal,Lateral Lower Leg. The wound measures 2.2cm length x 2.5cm width x 0.1cm depth; 4.32cm^2 area and 0.432cm^3 volume. There is Fat Layer (Subcutaneous Tissue) Exposed exposed. There is no tunneling or undermining noted. There is a large amount of purulent drainage noted. The wound margin is flat and intact. There is large (67-100%) red, pink granulation within the wound bed. There is a small (1-33%) amount of necrotic tissue within the wound bed including Adherent Slough. Wound #34 status is Open. Original cause of wound was Gradually Appeared. The wound is located on the Left,Distal,Lateral Lower Leg. The wound measures 1.3cm length x 1.8cm width x 0.1cm depth; 1.838cm^2 area and 0.184cm^3 volume. There is Fat Layer (Subcutaneous Tissue) Exposed exposed. There is no tunneling or undermining noted. There is a large amount of purulent drainage noted. The wound margin is flat and intact. There is small (1-33%) red granulation within the wound bed. There is a large (67-100%) amount of necrotic tissue within the wound bed including Adherent Slough. Wound #36 status is Open. Original cause of wound was Gradually Appeared. The wound is located on the Left,Posterior Lower Leg. The  wound measures 2cm length x 3cm width x 0.1cm depth; 4.712cm^2 area and 0.471cm^3 volume. There is Fat Layer (Subcutaneous Tissue) Exposed exposed. There is no tunneling  or undermining noted. There is a large amount of purulent drainage noted. The wound margin is distinct with the outline attached to the wound base. There is large (67-100%) pink, pale granulation within the wound bed. There is a small (1-33%) amount of necrotic tissue within the wound bed including Adherent Slough. 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 There was a Four Layer Compression Therapy Procedure by Carlene Coria, RN. Post procedure Diagnosis Wound #28: Same as Pre-Procedure Wound #29 Pre-procedure diagnosis of Wound #29 is a Lymphedema located on the Right,Posterior Lower Leg . There was a Four Layer Compression Therapy Procedure by Carlene Coria, RN. Post procedure Diagnosis Wound #29: Same as Pre-Procedure Wound #31 Pre-procedure diagnosis of Wound #31 is a Lymphedema located on the Right,Distal,Posterior Lower Leg . There was a Four Layer Compression Therapy Procedure by Carlene Coria, RN. Post procedure Diagnosis Wound #31: Same as Pre-Procedure Plan Follow-up Appointments: Return Appointment in 1 week. Skin Barriers/Peri-Wound Care: Barrier cream Moisturizing lotion TCA Cream or Ointment Wound Cleansing: May shower and wash wound with soap and water. Primary Wound Dressing: Wound #28 Right,Lateral Lower Leg: Hydrofera Blue - ready Wound #29 Right,Posterior Lower Leg: Hydrofera Blue - ready Wound #31 Right,Distal,Posterior Lower Leg: Hydrofera Blue - ready Wound #32 Left,Anterior Lower Leg: Hydrofera Blue - ready Wound #33 Left,Proximal,Lateral Lower Leg: Hydrofera Blue - ready Wound #34 Left,Distal,Lateral Lower Leg: Hydrofera Blue -  ready Secondary Dressing: Wound #28 Right,Lateral Lower Leg: ABD pad Wound #29 Right,Posterior Lower Leg: ABD pad Wound #31 Right,Distal,Posterior Lower Leg: ABD pad Wound #32 Left,Anterior Lower Leg: ABD pad Wound #33 Left,Proximal,Lateral Lower Leg: ABD pad Wound #34 Left,Distal,Lateral Lower Leg: ABD pad Edema Control: 4 layer compression - Right Lower Extremity - unna boot at top to help secure wrap Other: - left leg: PT to apply lymphedema wraps 1. I am continuing with silver alginate under compression 2. She sees our lymphedema therapist today 3. She is using her compression pumps. I think she has probably inflammatory arthritis in both knees likely erosive Electronic Signature(s) Signed: 02/21/2019 6:07:35 PM By: Linton Ham MD Entered By: Linton Ham on 02/21/2019 15:27:22 -------------------------------------------------------------------------------- SuperBill Details Patient Name: Date of Service: MIKAYA, GILTNER 02/21/2019 Medical Record [id number] Patient Account Number: [id number] Date of Birth/Sex: Treating RN: 12/20/1945 (73 y.o. Voncille Lo, [place] Primary Care Provider: [name] Other Clinician: Referring Provider: Treating Provider/Extender:Marysa Wessner, Tenna Child, [place] Siren in Treatment: 6 Diagnosis Coding ICD-10 Codes Code Description I89.0 Lymphedema, not elsewhere classified L97.221 Non-pressure chronic ulcer of left calf limited to breakdown of skin L97.211 Non-pressure chronic ulcer of right calf limited to breakdown of skin E11.622 Type 2 diabetes mellitus with other skin ulcer Facility Procedures CPT4 Code Description: [id number] (Facility Use Only) [phone] - APPLY MULTLAY COMPRS LWR RT LEG Modifier: Quantity: 1 Physician Procedures CPT4 Code Description: [id number] - WC PHYS LEVEL 3 - EST PT ICD-10 Diagnosis Description L97.221 Non-pressure chronic ulcer of left calf limited to breakd L97.211 Non-pressure chronic  ulcer of right calf limited to break Modifier: own of skin down of skin Quantity: 1  Electronic Signature(s) Signed: 02/21/2019 6:07:35 PM By: Linton Ham MD Entered By: Linton Ham on 02/21/2019 15:27:39

## 2019-02-28 ENCOUNTER — Other Ambulatory Visit: Payer: Self-pay

## 2019-02-28 ENCOUNTER — Encounter (HOSPITAL_BASED_OUTPATIENT_CLINIC_OR_DEPARTMENT_OTHER): Payer: PPO

## 2019-02-28 ENCOUNTER — Ambulatory Visit: Payer: PPO

## 2019-02-28 DIAGNOSIS — I890 Lymphedema, not elsewhere classified: Secondary | ICD-10-CM

## 2019-02-28 DIAGNOSIS — L97221 Non-pressure chronic ulcer of left calf limited to breakdown of skin: Secondary | ICD-10-CM | POA: Diagnosis not present

## 2019-02-28 DIAGNOSIS — E11622 Type 2 diabetes mellitus with other skin ulcer: Secondary | ICD-10-CM | POA: Diagnosis not present

## 2019-02-28 DIAGNOSIS — Z9119 Patient's noncompliance with other medical treatment and regimen: Secondary | ICD-10-CM | POA: Diagnosis not present

## 2019-02-28 DIAGNOSIS — R2689 Other abnormalities of gait and mobility: Secondary | ICD-10-CM

## 2019-02-28 DIAGNOSIS — I872 Venous insufficiency (chronic) (peripheral): Secondary | ICD-10-CM | POA: Diagnosis not present

## 2019-02-28 NOTE — Progress Notes (Signed)
[name] [name], [name] [name] ([id number]) Visit Report for 02/28/2019 Arrival Information Details Patient Name: Date of Service: [name] [name], [name] [name] 02/28/2019 12:45 PM Medical Record [id number] Patient Account Number: [id number] Date of Birth/Sex: Treating RN: 05-04-1945 (73 y.o. Debby Bud Primary Care Kevion Fatheree: [name] Other Clinician: Referring Sussie Minor: Treating Diedre Maclellan/Extender:Stone III, Alla German, [place] Siren in Treatment: 7 Visit Information History Since Last Visit Walker Added or deleted any medications: No Patient Arrived: Any new allergies or adverse reactions: No Arrival Time: 12:59 Had a fall or experienced change in No Accompanied By: self activities of daily living that may affect Transfer Assistance: None risk of falls: Patient Identification Verified: Yes Signs or symptoms of abuse/neglect since last No Secondary Verification Process Completed: Yes visito Patient Requires Transmission-Based No Hospitalized since last visit: No Precautions: Implantable device outside of the clinic excluding No Patient Has Alerts: No cellular tissue based products placed in the center since last visit: Has Dressing in Place as Prescribed: Yes Has Compression in Place as Prescribed: Yes Pain Present Now: No Electronic Signature(s) Signed: 02/28/2019 2:36:36 PM By: Deon Pilling Entered By: Deon Pilling on 02/28/2019 13:00:19 -------------------------------------------------------------------------------- Compression Therapy Details Patient Name: Date of Service: [name] [name], [name] [name] 02/28/2019 12:45 PM Medical Record [id number] Patient Account Number: [id number] Date of Birth/Sex: Treating RN: 1945/11/20 (73 y.o. Debby Bud Primary Care Stela Iwasaki: [name] Other Clinician: Referring Izell Labat: Treating Shaaron Golliday/Extender:Stone III, Alla German, [place] Siren in Treatment: 7 Compression Therapy Performed for Wound Wound #28  Right,Lateral Lower Leg Assessment: Performed By: Clinician Deon Pilling, RN Compression Type: Four Layer Pre Treatment ABI: 1.2 Electronic Signature(s) Signed: 02/28/2019 2:36:36 PM By: Deon Pilling Entered By: Deon Pilling on 02/28/2019 13:15:32 -------------------------------------------------------------------------------- Compression Therapy Details Patient Name: Date of Service: [name] [name], [name] [name] 02/28/2019 12:45 PM Medical Record [id number] Patient Account Number: [id number] Date of Birth/Sex: Treating RN: April 04, 1946 (73 y.o. Debby Bud Primary Care Veeda Virgo: [name] Other Clinician: Referring Keaira Whitehurst: Treating Madilynne Mullan/Extender:Stone III, Alla German, [place] Siren in Treatment: 7 Compression Therapy Performed for Wound Wound #31 Right,Distal,Posterior Lower Leg Assessment: Performed By: Baker Janus, RN Compression Type: Four Layer Pre Treatment ABI: 1.2 Electronic Signature(s) Signed: 02/28/2019 2:36:36 PM By: Deon Pilling Entered By: Deon Pilling on 02/28/2019 13:15:32 -------------------------------------------------------------------------------- Encounter Discharge Information Details Patient Name: Date of Service: [name] [name], [name] [name] 02/28/2019 12:45 PM Medical Record [id number] Patient Account Number: [id number] Date of Birth/Sex: Treating RN: 05-04-1945 (73 y.o. Debby Bud Primary Care Letitia Sabala: [name] Other Clinician: Referring Katryna Tschirhart: Treating Perri Aragones/Extender:Stone III, Alla German, [place] Siren in Treatment: 7 Encounter Discharge Information Items Discharge Condition: Stable Ambulatory Status: Walker Discharge Destination: Home Transportation: Private Auto Accompanied By: self Schedule Follow-up Appointment: Yes Clinical Summary of Care: Electronic Signature(s) Signed: 02/28/2019 2:36:36 PM By: Deon Pilling Entered By: Deon Pilling on 02/28/2019  13:17:26 -------------------------------------------------------------------------------- Patient/Caregiver Education Details [place], 11/24/2020andnbsp12:45 Patient Name: Date of Service: Quianna PM Medical Record Patient Account Number: [id number] Number: Treating RN: Deon Pilling Date of Birth/Gender: 20-Jul-1945 (73 y.o. F) Other Clinician: Primary Care Physician: [name] Treating Worthy Keeler Referring Physician: Physician/Extender: Elicia Lamp in Treatment: 7 Education Assessment Education Provided To: Patient Education Topics Provided Wound/Skin Impairment: Handouts: Skin Care Do's and Dont's Methods: Explain/Verbal Responses: Reinforcements needed Electronic Signature(s) Signed: 02/28/2019 2:36:36 PM By: Deon Pilling Entered By: Deon Pilling on 02/28/2019 13:17:15 -------------------------------------------------------------------------------- Wound Assessment Details Patient Name: Date of Service: [name] [name], [name] [name] 02/28/2019 12:45 PM Medical Record [id number] Patient Account Number: [id number] Date of Birth/Sex: Treating RN: Jan 12, 1946 (73 y.o. Debby Bud Primary Care Sandor Arboleda: [name] Other  Clinician: Referring Che Rachal: Treating Dunya Meiners/Extender:Stone III, Alla German, [place] Siren in Treatment: 7 Wound Status Wound Number: 28 Primary Lymphedema Etiology: Wound Location: Right, Lateral Lower Leg Secondary Diabetic Wound/Ulcer of the Lower Wounding Event: Gradually Appeared Etiology: Extremity Date Acquired: 08/05/2018 Wound Status: Open Weeks Of Treatment: 7 Clustered Wound: No Wound Measurements Length: (cm) 7 Width: (cm) 8 Depth: (cm) 0.1 Area: (cm) 43.982 Volume: (cm) 4.398 Wound Description Full Thickness Without Exposed Suppo Classification: Structures % Reduction in Area: -23.1% % Reduction in Volume: -23.1% rt Treatment Notes Wound #28 (Right, Lateral Lower  Leg) 1. Cleanse With Wound Cleanser Soap and water 2. Periwound Care Moisturizing lotion TCA Cream 3. Primary Dressing Applied Hydrofera Blue 4. Secondary Dressing ABD Pad 6. Support Layer Applied 4 layer compression wrap Notes unna boot upper portion of lower leg. Electronic Signature(s) Signed: 02/28/2019 2:36:36 PM By: Deon Pilling Entered By: Deon Pilling on 02/28/2019 13:01:02 -------------------------------------------------------------------------------- Wound Assessment Details Patient Name: Date of Service: [name] [name], [name] [name] 02/28/2019 12:45 PM Medical Record [id number] Patient Account Number: [id number] Date of Birth/Sex: Treating RN: [birth year] (73 y.o. Helene Shoe, Tammi Klippel Primary Care Kaison Mcparland: [name] Other Clinician: Referring Tiane Szydlowski: Treating Shadeed Colberg/Extender:Stone III, Alla German, [place] Siren in Treatment: 7 Wound Status Wound Number: 31 Primary Lymphedema Etiology: Wound Location: Right, Distal, Posterior Lower Leg Secondary Diabetic Wound/Ulcer of the Lower Wounding Event: Gradually Appeared Etiology: Extremity Date Acquired: 08/05/2018 Wound Status: Open Weeks Of Treatment: 7 Clustered Wound: No Wound Measurements Length: (cm) 6 Width: (cm) 4 Depth: (cm) 0.1 Area: (cm) 18.85 Volume: (cm) 1.885 Wound Description Classification: Full Thickness Without Exposed Suppo Structures % Reduction in Area: -1746.2% % Reduction in Volume: -1748% rt Treatment Notes Wound #31 (Right, Distal, Posterior Lower Leg) 1. Cleanse With Wound Cleanser Soap and water 2. Periwound Care Moisturizing lotion TCA Cream 3. Primary Dressing Applied Hydrofera Blue 4. Secondary Dressing ABD Pad 6. Support Layer Applied 4 layer compression wrap Notes unna boot upper portion of lower leg. Electronic Signature(s) Signed: 02/28/2019 2:36:36 PM By: Deon Pilling Entered By: Deon Pilling on 02/28/2019  13:01:02 -------------------------------------------------------------------------------- Wound Assessment Details Patient Name: Date of Service: [name] [name], [name] [name] 02/28/2019 12:45 PM Medical Record [id number] Patient Account Number: [id number] Date of Birth/Sex: Treating RN: 16-Dec-1945 (73 y.o. Helene Shoe, Tammi Klippel Primary Care Ariston Grandison: [name] Other Clinician: Referring Zarin Hagmann: Treating Chavela Justiniano/Extender:Stone III, Alla German, [place] Siren in Treatment: 7 Wound Status Wound Number: 32 Primary Lymphedema Etiology: Wound Location: Left, Anterior Lower Leg Secondary Diabetic Wound/Ulcer of the Lower Wounding Event: Gradually Appeared Etiology: Extremity Date Acquired: 08/05/2018 Wound Status: Open Weeks Of Treatment: 7 Clustered Wound: No Wound Measurements Length: (cm) 3 Width: (cm) 3 Depth: (cm) 0.1 Area: (cm) 7.069 Volume: (cm) 0.707 Wound Description Classification: Full Thickness Without Exposed Suppo Structures % Reduction in Area: -445.4% % Reduction in Volume: -443.8% rt Treatment Notes Wound #32 (Left, Anterior Lower Leg) 3. Primary Dressing Applied Hydrofera Blue 4. Secondary Dressing ABD Pad Notes PT to dress patient left leg. Electronic Signature(s) Signed: 02/28/2019 2:36:36 PM By: Deon Pilling Entered By: Deon Pilling on 02/28/2019 13:01:02 -------------------------------------------------------------------------------- Wound Assessment Details Patient Name: Date of Service: [name] [name], [name] [name] 02/28/2019 12:45 PM Medical Record [id number] Patient Account Number: [id number] Date of Birth/Sex: Treating RN: June 24, 1945 (73 y.o. Debby Bud Primary Care Rochelle Larue: [name] Other Clinician: Referring Lynett Brasil: Treating Merrick Maggio/Extender:Stone III, Alla German, [place] Siren in Treatment: 7 Wound Status Wound Number: 33 Primary Lymphedema Etiology: Wound Location: Left, Proximal, Lateral Lower  Leg Secondary Diabetic Wound/Ulcer of the Lower Wounding Event: Gradually Appeared Etiology: Extremity  Date Acquired: 08/05/2018 Wound Status: Open Weeks Of Treatment: 7 Clustered Wound: No Wound Measurements Length: (cm) 2.2 Width: (cm) 2.5 Depth: (cm) 0.1 Area: (cm) 4.32 Volume: (cm) 0.432 Wound Description Classification: Full Thickness Without Exposed Suppo Structures % Reduction in Area: 87.5% % Reduction in Volume: 87.5% rt Treatment Notes Wound #33 (Left, Proximal, Lateral Lower Leg) 3. Primary Dressing Applied Hydrofera Blue 4. Secondary Dressing ABD Pad Notes PT to dress patient left leg. Electronic Signature(s) Signed: 02/28/2019 2:36:36 PM By: Deon Pilling Entered By: Deon Pilling on 02/28/2019 13:01:03 -------------------------------------------------------------------------------- Wound Assessment Details Patient Name: Date of Service: [name] [name], [name] [name] 02/28/2019 12:45 PM Medical Record [id number] Patient Account Number: [id number] Date of Birth/Sex: Treating RN: 05/30/1945 (73 y.o. Helene Shoe, Tammi Klippel Primary Care Carvel Huskins: [name] Other Clinician: Referring Andersen Mckiver: Treating Suman Trivedi/Extender:Stone III, Alla German, [place] Siren in Treatment: 7 Wound Status Wound Number: 34 Primary Lymphedema Etiology: Wound Location: Left, Distal, Lateral Lower Leg Secondary Diabetic Wound/Ulcer of the Lower Wounding Event: Gradually Appeared Etiology: Extremity Date Acquired: 01/10/2019 Wound Status: Open Weeks Of Treatment: 7 Clustered Wound: No Wound Measurements Length: (cm) 1.3 Width: (cm) 1.8 Depth: (cm) 0.1 Area: (cm) 1.838 Volume: (cm) 0.184 Wound Description Full Thickness Without Exposed Suppo Classification: Structures % Reduction in Area: 31.6% % Reduction in Volume: 31.6% rt Treatment Notes Wound #34 (Left, Distal, Lateral Lower Leg) 3. Primary Dressing Applied Hydrofera Blue 4. Secondary Dressing ABD  Pad Notes PT to dress patient left leg. Electronic Signature(s) Signed: 02/28/2019 2:36:36 PM By: Deon Pilling Entered By: Deon Pilling on 02/28/2019 13:01:03 -------------------------------------------------------------------------------- Wound Assessment Details Patient Name: Date of Service: [name] [name], [name] [name] 02/28/2019 12:45 PM Medical Record [id number] Patient Account Number: [id number] Date of Birth/Sex: Treating RN: 1945-08-06 (73 y.o. Helene Shoe, Tammi Klippel Primary Care Eleah Lahaie: [name] Other Clinician: Referring Kemora Pinard: Treating Stacia Feazell/Extender:Stone III, Alla German, [place] Siren in Treatment: 7 Wound Status Wound Number: 36 Primary Etiology: Lymphedema Wound Location: Left, Posterior Lower Leg Wound Status: Open Wounding Event: Gradually Appeared Date Acquired: 01/26/2019 Weeks Of Treatment: 2 Clustered Wound: No Wound Measurements Length: (cm) 2 % Redu Width: (cm) 3 % Redu Depth: (cm) 0.1 Area: (cm) 4.712 Volume: (cm) 0.471 Wound Description Full Thickness Without Exposed Support Classification: Structures ction in Area: 0% ction in Volume: 0% Treatment Notes Wound #36 (Left, Posterior Lower Leg) 3. Primary Dressing Applied Hydrofera Blue 4. Secondary Dressing ABD Pad Notes PT to dress patient left leg. Electronic Signature(s) Signed: 02/28/2019 2:36:36 PM By: Deon Pilling Entered By: Deon Pilling on 02/28/2019 13:01:03 -------------------------------------------------------------------------------- Vitals Details Patient Name: Date of Service: [name] [name], [name] [name] 02/28/2019 12:45 PM Medical Record [id number] Patient Account Number: [id number] Date of Birth/Sex: Treating RN: [birth year] (73 y.o. Helene Shoe, Tammi Klippel Primary Care Shyvonne Chastang: [name] Other Clinician: Referring Jamareon Shimel: Treating Emmaleigh Longo/Extender:Stone III, Alla German, [place] Siren in Treatment: 7 Vital Signs Time Taken:  13:14 Temperature (F): 97.9 Height (in): 71 Pulse (bpm): 62 Weight (lbs): 240 Respiratory Rate (breaths/min): 20 Body Mass Index (BMI): 33.5 Blood Pressure (mmHg): 170/79 Reference Range: 80 - 120 mg / dl Electronic Signature(s) Signed: 02/28/2019 2:36:36 PM By: Deon Pilling Entered By: Deon Pilling on 02/28/2019 13:15:10

## 2019-02-28 NOTE — Progress Notes (Signed)
[name] [name], [name] [name] ([id number]) Visit Report for 02/28/2019 SuperBill Details Patient Name: Date of Service: [name] [name], [name] [name] 02/28/2019 Medical Record [id number] Patient Account Number: [id number] Date of Birth/Sex: Treating RN: 1945/10/25 (73 y.o. Helene Shoe, Tammi Klippel Primary Care Provider: [name] Other Clinician: Referring Provider: Treating Provider/Extender:Stone III, Alla German, [place] Siren in Treatment: 7 Diagnosis Coding ICD-10 Codes Code Description I89.0 Lymphedema, not elsewhere classified L97.221 Non-pressure chronic ulcer of left calf limited to breakdown of skin L97.211 Non-pressure chronic ulcer of right calf limited to breakdown of skin E11.622 Type 2 diabetes mellitus with other skin ulcer Facility Procedures CPT4 Code Description Modifier Quantity [id number] (Facility Use Only) [phone] - APPLY MULTLAY COMPRS LWR RT LEG 1 Electronic Signature(s) Signed: 02/28/2019 2:36:36 PM By: Deon Pilling Signed: 02/28/2019 10:29:27 PM By: Worthy Keeler PA-C Entered By: Deon Pilling on 02/28/2019 13:17:34

## 2019-02-28 NOTE — Therapy (Signed)
[place], [place], [address] Phone: [phone]   Fax:  [phone]  Physical Therapy Treatment  Patient Details  Name: [name] [name] MRN: [id number] Date of Birth: October 17, 1945 Referring Provider (PT): Darci Current   Encounter Date: 02/28/2019  PT End of Session - 02/28/19 1352    Visit Number  3    Number of Visits  25    Date for PT Re-Evaluation  03/28/19    PT Start Time  1350    PT Stop Time  1445    PT Time Calculation (min)  55 min    Activity Tolerance  Patient tolerated treatment well    Behavior During Therapy  [facility] for tasks assessed/performed       Past Medical History:  Diagnosis Date  . Acute kidney injury ([place])   . Aortic stenosis, mild 11/17/2013  . Arthritis    "both knees, spine, back, fingers" (11/29/2013)  . CHF (congestive heart failure) ([place])   . Edema   . GERD (gastroesophageal reflux disease)   . Heart murmur   . History of diastolic dysfunction    Echo [id number] with diastolic dysfunction  . HTN (hypertension)   . Morbid obesity ([place])   . OA (osteoarthritis)   . PAC (premature atrial contraction)    per prior Holter  . Palpitations   . Pneumonia    "as a child"  . PVC's (premature ventricular contractions)    per prior Holter  . SVT (supraventricular tachycardia) ([place])   . Tachycardia    noted at 07/28/11 visit. Started on beta blocker. Possible atrial flutter vs long PR tachycardia/AVNRT  . Type II diabetes mellitus ([place])     Past Surgical History:  Procedure Laterality Date  . CESAREAN SECTION  1985  . CESAREAN SECTION  1985  . SUPRAVENTRICULAR TACHYCARDIA ABLATION  11/29/2013  . SUPRAVENTRICULAR TACHYCARDIA ABLATION N/A 11/29/2013   Procedure: SUPRAVENTRICULAR TACHYCARDIA ABLATION;  Surgeon: Evans Lance, MD;  Location[facility] LAB;  Service: Cardiovascular;  Laterality: N/A;  . US ECHOCARDIOGRAPHY  07/25/2008   EF 55-60%  . VAGINAL DELIVERY       There were no vitals filed for this visit.  Subjective Assessment - 02/28/19 1352    Subjective  Pt reports that she had no adverse reactions to the bandages. She was able to leave it on. She states that she has had no increase in pain.    Pertinent History  osteo arthritis, hx cellulitis, phlebitis and history of edema in BLE, Hx Acute kidney injury, CHF and DM II    Patient Stated Goals  I want to get this swelling down and heal my wound.    Currently in Pain?  No/denies    Pain Score  0-No pain    Pain Orientation  Right;Left    Pain Frequency  Intermittent                       [facility] Adult PT Treatment/Exercise - 02/28/19 0001      Manual Therapy   Manual Therapy  Manual Lymphatic Drainage (MLD);Edema management    Manual therapy comments  wound care pt was washed with warm water after bandages were removed using Nased antimicrobial wound cleanser to loosen stuck on pieces of gauze bandage. Hydofera blue was used to cover 4 spots on the anterior/lateral lower leg with keracel silver alginate due to significant weeping. ABD pads were put over the entire distal aspect of the lower  leg and the posterior aspect of the lower leg. She was rubbed with triamcinolone acetonide and moisturizer prior to donning her wound dressing per MD order.     Edema Management  tubigrip for thigh high education to wear over compression wrap to the groin and wound care.    Compression Bandaging  Pt was wrapped with 2 artiflex for shaping and 1 8 cm roman sandal, 3 10 cm wraps 1 for ankle sole heel and then spiral up the lower leg to the knee this session. Tubigrip for base layer to the knee and pt will wash her tubigrip at home and apply over bandage to the groin for knee/thigh compression. Toes were wrapped with 2 inch elastomull folded digits 1-5.              PT Education - 02/28/19 1448    Education Details  Re-iterated education on when to remove the wraps including pain, tingling,  numbness/throbbing that is not relieved with movement or removing the top wrap. Pt was instructed if she removes her wrap to leave her wound dressing on and apply the tubigrip over for light compression. She will wash her thigh high tubigrip at home and apply over her compression bandage to add light compression up to the groin.    Person(s) Educated  Patient    Methods  Explanation    Comprehension  Verbalized understanding       PT Short Term Goals - 02/21/19 1654      PT SHORT TERM GOAL #1   Title  Pt will be independent with HEP    Baseline  Pt does not have an HEP    Time  6    Period  Weeks    Status  New    Target Date  04/11/19        PT Long Term Goals - 02/21/19 1654      PT LONG TERM GOAL #1   Title  Patient will have reduction of L lower leg limb girth by 3 cm    Baseline  See measurements    Time  12    Period  Weeks    Status  New    Target Date  05/23/19      PT LONG TERM GOAL #2   Title  Patient to be properly fitted with compression garment to wear on daily basis.    Baseline  Pt is unable to fit her compression garments and has no compression for the thighs.    Time  12    Period  Weeks    Status  New    Target Date  05/23/19      PT LONG TERM GOAL #3   Title  Patient will be independent in self-care management principles including self-massage/bandaging and long term management plan for edema.    Baseline  Pt has unmanaged lymphedema that results in chronic wound    Time  12    Period  Weeks    Status  New    Target Date  05/23/19            Plan - 02/28/19 1352    Clinical Impression Statement  Pt continues to present with visible reduction in size of her L lower leg. No measurements were taken this session. Pt had less weping this session when bandages were removed. ABD pads were still placed over the wound after this physical therapist performed wound care in order to decrease weeping on the compression wrap. Pt was  educated to wash her high  high tubigrip at home and place over her compression wrap for light compression on the LLE from foot to groin. Pt toe wrap did well last session with no visible swelling in the toes this session when the wrap was removed. Pt has not yet purchased compression shorts and was encouraged to do this soon. Pt will benefit from continued POC at this time.    Personal Factors and Comorbidities  Age;Comorbidity 3+    Comorbidities  CHF, acute kidney injury, DM II    PT Frequency  2x / week    PT Duration  12 weeks    PT Treatment/Interventions  Electrical Stimulation;Cryotherapy;Gait training;Stair training;Functional mobility training;Therapeutic activities;Therapeutic exercise;Balance training;Neuromuscular re-education;Patient/family education;Manual lymph drainage;Compression bandaging;Passive range of motion;Taping;Vasopneumatic Device    PT Next Visit Plan  Assess wrap, give exercises, assess self MLD and use of tubigrip to the thigh    Consulted and Agree with Plan of Care  Patient       Patient will benefit from skilled therapeutic intervention in order to improve the following deficits and impairments:  Difficulty walking, Increased edema  Visit Diagnosis: Lymphedema  Other abnormalities of gait and mobility     Problem List Patient Active Problem List   Diagnosis Date Noted  . Postnasal drip 11/16/2017  . Sensorineural hearing loss (SNHL), bilateral 11/16/2017  . Tinnitus of both ears 11/16/2017  . Obesity, Class II, BMI 35-39.9, isolated (see actual BMI) 11/08/2017  . CKD (chronic kidney disease), stage III 11/08/2017  . Anemia 11/07/2017  . Thrombocytosis ([place]) 11/07/2017  . Lymphedema 11/07/2017  . AKI (acute kidney injury) ([place]) 10/16/2017  . Routine general medical examination at a health care facility 05/10/2014  . Paroxysmal SVT (supraventricular tachycardia) ([place]) 11/28/2013  . Physical deconditioning 11/23/2013  . Generalized weakness 11/17/2013  . Nausea 11/17/2013  .  Multilevel spine pain 11/17/2013  . Aortic stenosis, mild 11/17/2013  . Acute lower UTI 11/17/2013  . Cellulitis of left lower extremity 11/17/2013  . DM type 2 (diabetes mellitus, type 2) ([place]) 02/02/2013  . Arthritis 10/19/2012  . Mixed incontinence 10/19/2012  . GERD (gastroesophageal reflux disease) 04/20/2011  . HTN (hypertension) 01/20/2011  . Morbid obesity ([place])     Ander Purpura, PT 02/28/2019, 4:21 PM  [place], [place], [address] Phone: [phone]   Fax:  [phone]  Name: Jaleeza Shimp MRN: [id number] Date of Birth: Dec 02, 1945

## 2019-03-07 ENCOUNTER — Encounter (HOSPITAL_BASED_OUTPATIENT_CLINIC_OR_DEPARTMENT_OTHER): Payer: PPO | Attending: Internal Medicine | Admitting: Internal Medicine

## 2019-03-07 ENCOUNTER — Ambulatory Visit: Payer: PPO | Attending: Internal Medicine

## 2019-03-07 ENCOUNTER — Other Ambulatory Visit: Payer: Self-pay

## 2019-03-07 DIAGNOSIS — I890 Lymphedema, not elsewhere classified: Secondary | ICD-10-CM | POA: Insufficient documentation

## 2019-03-07 DIAGNOSIS — L97211 Non-pressure chronic ulcer of right calf limited to breakdown of skin: Secondary | ICD-10-CM | POA: Insufficient documentation

## 2019-03-07 DIAGNOSIS — L97221 Non-pressure chronic ulcer of left calf limited to breakdown of skin: Secondary | ICD-10-CM | POA: Diagnosis not present

## 2019-03-07 DIAGNOSIS — R2689 Other abnormalities of gait and mobility: Secondary | ICD-10-CM | POA: Insufficient documentation

## 2019-03-07 DIAGNOSIS — E1140 Type 2 diabetes mellitus with diabetic neuropathy, unspecified: Secondary | ICD-10-CM | POA: Diagnosis not present

## 2019-03-07 DIAGNOSIS — I509 Heart failure, unspecified: Secondary | ICD-10-CM | POA: Diagnosis not present

## 2019-03-07 DIAGNOSIS — I87313 Chronic venous hypertension (idiopathic) with ulcer of bilateral lower extremity: Secondary | ICD-10-CM | POA: Diagnosis not present

## 2019-03-07 DIAGNOSIS — E11622 Type 2 diabetes mellitus with other skin ulcer: Secondary | ICD-10-CM | POA: Insufficient documentation

## 2019-03-07 DIAGNOSIS — L97812 Non-pressure chronic ulcer of other part of right lower leg with fat layer exposed: Secondary | ICD-10-CM | POA: Diagnosis not present

## 2019-03-07 DIAGNOSIS — E1151 Type 2 diabetes mellitus with diabetic peripheral angiopathy without gangrene: Secondary | ICD-10-CM | POA: Insufficient documentation

## 2019-03-07 DIAGNOSIS — M1990 Unspecified osteoarthritis, unspecified site: Secondary | ICD-10-CM | POA: Diagnosis not present

## 2019-03-07 DIAGNOSIS — I110 Hypertensive heart disease with heart failure: Secondary | ICD-10-CM | POA: Insufficient documentation

## 2019-03-07 DIAGNOSIS — L97212 Non-pressure chronic ulcer of right calf with fat layer exposed: Secondary | ICD-10-CM | POA: Diagnosis not present

## 2019-03-07 NOTE — Therapy (Signed)
[place], [place], [address] Phone: [phone]   Fax:  [phone]  Physical Therapy Treatment  Patient Details  Name: [name] [name] MRN: [id number] Date of Birth: Sep 23, 1945 Referring Provider (PT): Darci Current   Encounter Date: 03/07/2019  PT End of Session - 03/07/19 1401    Visit Number  4    Number of Visits  25    Date for PT Re-Evaluation  03/28/19    PT Start Time  1455    PT Stop Time  1535    PT Time Calculation (min)  40 min    Activity Tolerance  Patient tolerated treatment well    Behavior During Therapy  [facility] for tasks assessed/performed       Past Medical History:  Diagnosis Date  . Acute kidney injury ([place])   . Aortic stenosis, mild 11/17/2013  . Arthritis    "both knees, spine, back, fingers" (11/29/2013)  . CHF (congestive heart failure) ([place])   . Edema   . GERD (gastroesophageal reflux disease)   . Heart murmur   . History of diastolic dysfunction    Echo 123XX123 with diastolic dysfunction  . HTN (hypertension)   . Morbid obesity ([place])   . OA (osteoarthritis)   . PAC (premature atrial contraction)    per prior Holter  . Palpitations   . Pneumonia    "as a child"  . PVC's (premature ventricular contractions)    per prior Holter  . SVT (supraventricular tachycardia) ([place])   . Tachycardia    noted at 07/28/11 visit. Started on beta blocker. Possible atrial flutter vs long PR tachycardia/AVNRT  . Type II diabetes mellitus ([place])     Past Surgical History:  Procedure Laterality Date  . CESAREAN SECTION  1985  . CESAREAN SECTION  1985  . SUPRAVENTRICULAR TACHYCARDIA ABLATION  11/29/2013  . SUPRAVENTRICULAR TACHYCARDIA ABLATION N/A 11/29/2013   Procedure: SUPRAVENTRICULAR TACHYCARDIA ABLATION;  Surgeon: Evans Lance, MD;  Location[facility] CATH LAB;  Service: Cardiovascular;  Laterality: N/A;  . US ECHOCARDIOGRAPHY  07/25/2008   EF 55-60%  . VAGINAL DELIVERY       There were no vitals filed for this visit.  Subjective Assessment - 03/07/19 1401    Pertinent History  osteo arthritis, hx cellulitis, phlebitis and history of edema in BLE, Hx Acute kidney injury, CHF and DM II    Patient Stated Goals  I want to get this swelling down and heal my wound.    Currently in Pain?  No/denies    Pain Score  0-No pain    Pain Location  Knee   Pt reports that she had some shooting pain in both of her lower legs over the weekend that did not last long.                      [facility] Adult PT Treatment/Exercise - 03/07/19 0001      Manual Therapy   Manual Therapy  Edema management;Compression Bandaging    Manual therapy comments  pt was washed prior to physical therapy appointment by wound clinic. Physical therapist applied dressingincluging Hydrofera blue to cover 4 spots on the anterior/lateral lower leg . ABDpads were putover the entire distal aspect of the loewr leg and the posterior aspect of the lower leg. She was rubbed with triamcinolone acetonide andmoisturizer prior to donning her wound dressing per MD order.     Edema Management  tubigrip for the L thigh seperate piece in  order to improve ease of donning/doffing.     Compression Bandaging  Pt was wrapped with 2 artiflex for shaping and 1 8 cm roman sandal, 3 10 cm wraps 1 for ankle sole heel and then spiral up the lower leg to the knee this session. Tubigrip for base layer to the knee and pt will wash her tubigrip at home and apply over bandage to the groin for knee/thigh compression. Toes were wrapped with 2 inch elastomull folded digits 1-5.              PT Education - 03/07/19 1538    Education Details  Re-iterated education on when to remove the wraps including pain, tingling, numbness/throbbing that is ont relieved with movement or removing the top wrap. Pt was educated on the importance of compression to her thigh in order to decrease edema in her lower legs to reduct congestion  up the chain in the lymphatic system. Tubigrip was provided this session justa seperate knee to groin piece to see if this improves compliance with over the knee compression.    Person(s) Educated  Patient    Methods  Explanation    Comprehension  Verbalized understanding       PT Short Term Goals - 02/21/19 1654      PT SHORT TERM GOAL #1   Title  Pt will be independent with HEP    Baseline  Pt does not have an HEP    Time  6    Period  Weeks    Status  New    Target Date  04/11/19        PT Long Term Goals - 02/21/19 1654      PT LONG TERM GOAL #1   Title  Patient will have reduction of L lower leg limb girth by 3 cm    Baseline  See measurements    Time  12    Period  Weeks    Status  New    Target Date  05/23/19      PT LONG TERM GOAL #2   Title  Patient to be properly fitted with compression garment to wear on daily basis.    Baseline  Pt is unable to fit her compression garments and has no compression for the thighs.    Time  12    Period  Weeks    Status  New    Target Date  05/23/19      PT LONG TERM GOAL #3   Title  Patient will be independent in self-care management principles including self-massage/bandaging and long term management plan for edema.    Baseline  Pt has unmanaged lymphedema that results in chronic wound    Time  12    Period  Weeks    Status  New    Target Date  05/23/19            Plan - 03/07/19 1400    Clinical Impression Statement  Pt presents with wounds improving this session and she had no weeping this session. Pt toe wraps continue to do well with no signs of constricture and no reports of numbness, tingling or pain. Pt was provided with a piece of tubigrip for the knee to the groin that she can remove to see if this doesn't improve compliance with above the knee compression. Re-iterated education on the importance of above the knee compression. Pt will benefit form continued POC at this time.    Personal Factors and  Comorbidities  Age;Comorbidity 3+    Comorbidities  CHF, acute kidney injury, DM II    Rehab Potential  Good    PT Frequency  2x / week    PT Duration  12 weeks    PT Treatment/Interventions  Electrical Stimulation;Cryotherapy;Gait training;Stair training;Functional mobility training;Therapeutic activities;Therapeutic exercise;Balance training;Neuromuscular re-education;Patient/family education;Manual lymph drainage;Compression bandaging;Passive range of motion;Taping;Vasopneumatic Device    PT Next Visit Plan  Measurements next session, Assess wrap, give exercises, assess self MLD and use of tubigrip to the thigh    PT Home Exercise Plan  ask about pump, provide exercises    Consulted and Agree with Plan of Care  Patient       Patient will benefit from skilled therapeutic intervention in order to improve the following deficits and impairments:  Difficulty walking, Increased edema  Visit Diagnosis: Lymphedema  Other abnormalities of gait and mobility     Problem List Patient Active Problem List   Diagnosis Date Noted  . Postnasal drip 11/16/2017  . Sensorineural hearing loss (SNHL), bilateral 11/16/2017  . Tinnitus of both ears 11/16/2017  . Obesity, Class II, BMI 35-39.9, isolated (see actual BMI) 11/08/2017  . CKD (chronic kidney disease), stage III 11/08/2017  . Anemia 11/07/2017  . Thrombocytosis ([place]) 11/07/2017  . Lymphedema 11/07/2017  . AKI (acute kidney injury) ([place]) 10/16/2017  . Routine general medical examination at a health care facility 05/10/2014  . Paroxysmal SVT (supraventricular tachycardia) ([place]) 11/28/2013  . Physical deconditioning 11/23/2013  . Generalized weakness 11/17/2013  . Nausea 11/17/2013  . Multilevel spine pain 11/17/2013  . Aortic stenosis, mild 11/17/2013  . Acute lower UTI 11/17/2013  . Cellulitis of left lower extremity 11/17/2013  . DM type 2 (diabetes mellitus, type 2) ([place]) 02/02/2013  . Arthritis 10/19/2012  . Mixed incontinence  10/19/2012  . GERD (gastroesophageal reflux disease) 04/20/2011  . HTN (hypertension) 01/20/2011  . Morbid obesity ([place])     Ander Purpura, PT 03/07/2019, 3:47 PM  [place], [place], [address] Phone: [phone]   Fax:  [phone]  Name: Hildy Faries MRN: [id number] Date of Birth: [birth year]

## 2019-03-08 ENCOUNTER — Other Ambulatory Visit: Payer: Self-pay | Admitting: Internal Medicine

## 2019-03-08 NOTE — Telephone Encounter (Signed)
Please call, she is due for visit (can be phone). Okay for refills though

## 2019-03-09 ENCOUNTER — Other Ambulatory Visit: Payer: Self-pay

## 2019-03-09 ENCOUNTER — Ambulatory Visit: Payer: PPO

## 2019-03-09 DIAGNOSIS — I890 Lymphedema, not elsewhere classified: Secondary | ICD-10-CM | POA: Diagnosis not present

## 2019-03-09 DIAGNOSIS — R2689 Other abnormalities of gait and mobility: Secondary | ICD-10-CM

## 2019-03-09 NOTE — Telephone Encounter (Signed)
Can you make patient a visit for further refills. Can be a phone visit. Thank you

## 2019-03-09 NOTE — Therapy (Signed)
[place], [place], [address] Phone: [phone]   Fax:  [phone]  Physical Therapy Treatment  Patient Details  Name: [name] [name] MRN: [id number] Date of Birth: [birth year] Referring Provider (PT): Darci Current   Encounter Date: 03/09/2019  PT End of Session - 03/09/19 1449    Visit Number  5    Number of Visits  25    Date for PT Re-Evaluation  03/28/19    PT Start Time  1400    PT Stop Time  1445    PT Time Calculation (min)  45 min    Activity Tolerance  Patient tolerated treatment well    Behavior During Therapy  [facility] for tasks assessed/performed       Past Medical History:  Diagnosis Date  . Acute kidney injury ([place])   . Aortic stenosis, mild 11/17/2013  . Arthritis    "both knees, spine, back, fingers" (11/29/2013)  . CHF (congestive heart failure) ([place])   . Edema   . GERD (gastroesophageal reflux disease)   . Heart murmur   . History of diastolic dysfunction    Echo [id number] with diastolic dysfunction  . HTN (hypertension)   . Morbid obesity ([place])   . OA (osteoarthritis)   . PAC (premature atrial contraction)    per prior Holter  . Palpitations   . Pneumonia    "as a child"  . PVC's (premature ventricular contractions)    per prior Holter  . SVT (supraventricular tachycardia) ([place])   . Tachycardia    noted at 07/28/11 visit. Started on beta blocker. Possible atrial flutter vs long PR tachycardia/AVNRT  . Type II diabetes mellitus ([place])     Past Surgical History:  Procedure Laterality Date  . CESAREAN SECTION  1985  . CESAREAN SECTION  1985  . SUPRAVENTRICULAR TACHYCARDIA ABLATION  11/29/2013  . SUPRAVENTRICULAR TACHYCARDIA ABLATION N/A 11/29/2013   Procedure: SUPRAVENTRICULAR TACHYCARDIA ABLATION;  Surgeon: Evans Lance, MD;  Location: [facility] CATH LAB;  Service: Cardiovascular;  Laterality: N/A;  . US ECHOCARDIOGRAPHY  07/25/2008   EF 55-60%  . VAGINAL DELIVERY       There were no vitals filed for this visit.  Subjective Assessment - 03/09/19 1415    Subjective  Pt reports that she had no adverse effects from the bandages. She was able to leave them on until today. She had no increase in pain from base line in her knees and no increase in pain in her lower leg.    Pertinent History  osteo arthritis, hx cellulitis, phlebitis and history of edema in BLE, Hx Acute kidney injury, CHF and DM II    Patient Stated Goals  I want to get this swelling down and heal my wound.    Currently in Pain?  No/denies    Pain Score  0-No pain            LYMPHEDEMA/ONCOLOGY QUESTIONNAIRE - 03/09/19 1416      Left Lower Extremity Lymphedema   20 cm Proximal to Suprapatella  70 cm    10 cm Proximal to Suprapatella  65 cm    At Midpatella/Popliteal Crease  61 cm    30 cm Proximal to Floor at Lateral Plantar Foot  47.7 cm    10 cm Proximal to Floor at Lateral Malleoli  30 cm    Circumference of ankle/heel  29 cm.    5 cm Proximal to 1st MTP Joint  22.5 cm    Across MTP Joint  24.8 cm    Around Proximal Great Toe  9.3 cm                [facility] Adult PT Treatment/Exercise - 03/09/19 0001      Manual Therapy   Manual Therapy  Edema management;Compression Bandaging    Edema Management  Pt was assisted with donning and doffing her compression work out pants and with removing them quickly to the knee due to pt has concerns of urinary urgency. Pt was able to remove easily and quickly due to pants are very light compression slightly stronger than tubigrip which seems to be adequatley reducing edema in the L thigh at this time.     Compression Bandaging  Pt was wrapped with 3 artiflex this session for shaping and 1 8 cm roman sandal, 2 10 cm wraps for ankle sole heel and then spiral wrapped up the lower leg to below the knee and one last spiral wrap on top for added compression. 2 inch elasomull folded was used at the toes digits 1-5 to control edema and 1 abd pad  was placed from the transition of dorsum of foot to ankle in order to decrease pooling of fluid and facilitate lymphatic flow in this area.              PT Education - 03/09/19 1447    Education Details  Re-iterated education on when to remove the wraps including pain, tingling, numbness/throbbing that is not relieved with movement or removing the top wrap. Pt was educated on donning her work out pants and practiced removing them quickly due to urgency concerns. Discussed continuing to wear tubigrip over the R thigh when she is unable to wear the work out pants. Discussed pt improvements this session including significant reduction in edema of the LLE.    Person(s) Educated  Patient    Methods  Explanation;Verbal cues    Comprehension  Verbalized understanding;Returned demonstration       PT Short Term Goals - 02/21/19 1654      PT SHORT TERM GOAL #1   Title  Pt will be independent with HEP    Baseline  Pt does not have an HEP    Time  6    Period  Weeks    Status  New    Target Date  04/11/19        PT Long Term Goals - 02/21/19 1654      PT LONG TERM GOAL #1   Title  Patient will have reduction of L lower leg limb girth by 3 cm    Baseline  See measurements    Time  12    Period  Weeks    Status  New    Target Date  05/23/19      PT LONG TERM GOAL #2   Title  Patient to be properly fitted with compression garment to wear on daily basis.    Baseline  Pt is unable to fit her compression garments and has no compression for the thighs.    Time  12    Period  Weeks    Status  New    Target Date  05/23/19      PT LONG TERM GOAL #3   Title  Patient will be independent in self-care management principles including self-massage/bandaging and long term management plan for edema.    Baseline  Pt has unmanaged lymphedema that results in chronic wound    Time  12  Period  Weeks    Status  New    Target Date  05/23/19            Plan - 03/09/19 1401    Clinical  Impression Statement  Pt was re-measured this session with significant improvements in LLE throughout from ankle to groin with wrapping at the lower leg and light compression at the L thigh using tubigrip and she has not purchased compression work out pants with light compression. Pt practiced donning/doffing her compression pants this session with physical therapist and removing quickly due to pt has concens with wearing compression due to urinary urgency. Pt was wrappedthis session from her foot to below the knee with short stretch bandages and with tubigrip at the thigh. Re-iterated education on when to remove the wrap or the top layer. Pt will practice wearing her compression pants over the weekend. Pt had no weeping this session and her bandage will remain in place until her next session. She will benefit from continued POC at this time.    Personal Factors and Comorbidities  Age;Comorbidity 3+    Comorbidities  CHF, acute kidney injury, DM II    Rehab Potential  Good    PT Frequency  2x / week    PT Duration  12 weeks    PT Treatment/Interventions  Electrical Stimulation;Cryotherapy;Gait training;Stair training;Functional mobility training;Therapeutic activities;Therapeutic exercise;Balance training;Neuromuscular re-education;Patient/family education;Manual lymph drainage;Compression bandaging;Passive range of motion;Taping;Vasopneumatic Device    PT Next Visit Plan  Assess wrap, give exercises, assess self MLD and possible work out pants?    PT Home Exercise Plan  ask about pump, provide exercises    Consulted and Agree with Plan of Care  Patient       Patient will benefit from skilled therapeutic intervention in order to improve the following deficits and impairments:  Difficulty walking, Increased edema  Visit Diagnosis: Lymphedema  Other abnormalities of gait and mobility     Problem List Patient Active Problem List   Diagnosis Date Noted  . Postnasal drip 11/16/2017  .  Sensorineural hearing loss (SNHL), bilateral 11/16/2017  . Tinnitus of both ears 11/16/2017  . Obesity, Class II, BMI 35-39.9, isolated (see actual BMI) 11/08/2017  . CKD (chronic kidney disease), stage III 11/08/2017  . Anemia 11/07/2017  . Thrombocytosis ([place]) 11/07/2017  . Lymphedema 11/07/2017  . AKI (acute kidney injury) ([place]) 10/16/2017  . Routine general medical examination at a health care facility 05/10/2014  . Paroxysmal SVT (supraventricular tachycardia) ([place]) 11/28/2013  . Physical deconditioning 11/23/2013  . Generalized weakness 11/17/2013  . Nausea 11/17/2013  . Multilevel spine pain 11/17/2013  . Aortic stenosis, mild 11/17/2013  . Acute lower UTI 11/17/2013  . Cellulitis of left lower extremity 11/17/2013  . DM type 2 (diabetes mellitus, type 2) ([place]) 02/02/2013  . Arthritis 10/19/2012  . Mixed incontinence 10/19/2012  . GERD (gastroesophageal reflux disease) 04/20/2011  . HTN (hypertension) 01/20/2011  . Morbid obesity ([place])     [name] [name], PT 03/09/2019, 2:56 PM  [place], [place], [address] Phone: [phone]   Fax:  [phone]  Name: [name] [name] MRN: [id number] Date of Birth: 06/04/1945

## 2019-03-09 NOTE — Telephone Encounter (Signed)
Appointment scheduled.

## 2019-03-14 ENCOUNTER — Ambulatory Visit: Payer: Self-pay

## 2019-03-14 ENCOUNTER — Encounter (HOSPITAL_BASED_OUTPATIENT_CLINIC_OR_DEPARTMENT_OTHER): Payer: PPO | Admitting: Internal Medicine

## 2019-03-14 NOTE — Progress Notes (Signed)
[name] [name], [name] [name] ([id number]) Visit Report for 01/24/2019 HPI Details Patient Name: Date of Service: [name] [name], [name] [name] 01/24/2019 8:00 AM Medical Record [id number] Patient Account Number: [id number] Date of Birth/Sex: Treating RN: 17-Dec-1945 (73 y.o. [name] [name] Primary Care Provider: [name] Other Clinician: Referring Provider: Treating Provider/Extender:Erla Bacchi, Tenna Child, [place] Siren in Treatment: 2 History of Present Illness HPI Description: 04/09/15; the patient is here for review of wounds on her bilateral lower extremities for the last month. The patient has a history of lymphedema and bilateral chronic venous insufficiency with inflammation she was here for review of wounds on her bilateral legs in 2014 she was discharged home with external compression pumps which she is currently not using. Over the last month she developed wounds on her bilateral lower legs with drainage and pain and she is here for our review of this. 04/16/15; the patient's edema is under much better control. She has 3 areas one on the right anterior medial leg one on the left posterior leg and a small open area with purulent drainage on the left anterior leg. I have cultured the area on the left anterior leg 04/23/15; continued improvement in the patient's edema. All her wounds of closed I see no open areas. Culture from the left anterior leg last time was negative. READMISSION 02/07/16; this patient is a woman that I saw her earlier in 2017. I believe we discharged her very quickly with bilateral wounds in her legs secondary to chronic venous hypertension with inflammation and secondary lymphedema. She had juxtalite stockings. She has external compression pumps at home from a stay in our clinic in 2014. She tells me that she's had wounds on her bilateral lower legs since March or April of this year. 3 wounds on the left 2 on the right. She is a type II diabetic and has a history of  noncompressible vessels bilaterally although she is tolerated 4-layer compression. The last note from her primary physician Dr. [name] was in May. At that point Dr. [name] had referred the patient here for lymphedema management. The patient stated she made a phone call to year but was not able to arrange an appointment. It is not clear that she is actually using anything on the wounds currently except some gauze that was saturated with drainage per our intake nurse. She is certainly not using her juxtalite stockings and by the patient's own admission she is not using her compression pumps because they are "irritating". I don't see a recent hemoglobin A1c. The patient states she has not been systemically unwell but the legs are painful 02/14/16; patient tolerated her Profore wraps reasonably well. She is using the compression pumps 1 time per day. Measurements of her legs are better in terms of the amount of lymphedema 02/21/16; patient tolerated her Profore wraps it. She says she is using her compression pumps one or 2 times a day. Measurements of her leg circumference are improved and most of her wound dimensions are better, unfortunately the home health that we referred her to did not except her insurance but works successful in getting her accompanied that would except her insurance however she apparently has a $25 co-pay which she cannot afford as she is on a fixed income 03/05/16; patient states she did not back using the pumps. She is healed that 2 wounds on the left leg. She still has areas on the right lateral lower leg, right medial lower leg and left lateral lower leg. All of these look some better and have  reduced in area 03/13/16; still 3 wounds 1 on the left lateral leg, one on the left medial/posterior leg which is really the most extensive and a small area on the right lateral leg. All of this in the setting of severe chronic lymphedema and chronic  venous inflammation. Damage to the skin with cobblestone thickened skin. 03/20/16; still 2 areas. The area on the left lateral leg appears to of closed over. The right medial leg is still most extensive wound although it appears to be closing over right lateral only a small open area remains. Using silver alginate Profore wraps 03/27/16; the area on the left leg remains closed. The edema control is better on the left than the right. The right medial leg is still weeping but everything appears to be epithelialized. The lateral aspect of the right leg appears closed. She tells me she has old juxtalite stockings. She has been compliant with her compression pumps. 04/03/16; the area on the left leg remains closed. She has ordered new juxtalite stockings. The right posterior medial wound is fully epithelialized. However she has an irritated area over the right Achilles that she states is been itching all week under the wraps.. She has been compliant with her compression pumps 04/10/16; the area on the left leg remains closed. The right posterior medial wound is also closed today. The irritated area over the right Achilles which was probably wrap injury is also fully epithelialized and closed. The patient has not received her new juxtalite stockings, she arrives in clinic today with an old juxtalite stocking in place. Sheis using her compression pumps secondary to her severe stage III lymphedema READMISSION 01/21/17; this is a patient we know from several previous stays in this clinic. She has lymphedema with chronic venous insufficiency and inflammation. The last time she was here we discharged her with bilateral juxta lites which she is compliant with. She also has external compression pumps at home from stays in our clinic in 2014 although she tells me that she is not very compliant with these as when she uses them a causes pain in her bilateral knees the patient states that her wounds on her left  greater than right leg including 2 large areas on the left anterior and left lateral leg and an area on the right leg open in late June or July since then she is been followed by [facility] and she's had bilateral Unna boot supplied every week for the last month.. I think the wounds have actually improved although they are not specifically dressed. She states she had an ultrasound to rule out DVT bilaterally that was negative. She does not have arterial studies although she is a diabetic. She states she has a lot of pain in her legs she states she does not use her external compression pumps because it causes pain in her knees. As usual her ABIs were noncompressible bilaterally in this clinic 01/28/17; currently the patient only has one small wound on the left lateral leg and I think this should be healed next week. We are going to try to get her bilateral extremitease stockings. She tells me that compression pumps heard her posterior rib arthritic knees so she is not been compliant with this 02/04/17; the patient's wounds are totally healed. She has chronic venous insufficiency and secondary lymphedema. She has new extremitease stockings. She also has compression pumps Readmission: 04/21/17 on evaluation today patient appears to be doing about the same as what she was previous when we were evaluating  her back in November 2018. At that point she had completely healed. With that being said she has now reopened and states that she wasn't aware that she was supposed to come back. 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 Subsequently she also states that although she has the compression that we got for her previously the extremitease stockings she nonetheless doesn't always wear these and being noncompliant often has things will reopen as far as ulcers on her lower extremities. No fevers,  chills, nausea, or vomiting noted at this time. Patient has no evidence of dementia. She is having some discomfort in regard to the ulcers at this point. 04/28/17 on evaluation today patient appears to be doing significantly better in regard to her wounds. With that being said she is still having significant discomfort and is not really keen on sharp debridement. She does not appear to have any evidence of infection which is good news I do think the Iodoflex is beneficial for her. Of note her ABI's were found in the system to be on the right 1.21 with a TBI of 0.76 and on the left 1.35 with a TBI of 0.75. In general her swelling appears to be significantly improved however. No fevers, chills, nausea, or vomiting noted at this time. Patient has been tolerating the wraps without complication. She has no evidence of dementia. 05/05/17-she is here in follow-up evaluation for bilateral lower extremity venous and lymphedema ulcers. She states she has not been using her lymphedema pumps secondary to pain at her knees. After discussion it was noted that she uses knee-high lymphedema pumps but does have a pair of thigh-high lymphedema pumps. She was strongly advised to use the thigh-high lymphedema pumps and if necessary reduce the setting for improved tolerance. She reports no change in odor and/or drainage. She continues to tolerate sharp debridement poorly with significant pain, primarily to the right lower extremity. We will continue with current treatment plan, along with improved lymphedema pump use and follow-up next week 05/12/17 on evaluation today patient actually appears to be doing much better in regard to her bilateral lower extremity ulcers. Only the right altar actually requires debridement today. The other two cleaned off nicely in two of the three in fact on the left lower extremity or actually almost completely healed. She does have some weeping noted still the bilateral lower extremities left  greater than right. She still is not able to use her compression pumps even though I have mentioned this several times to her as did Leah last week. No fevers, chills, nausea, or vomiting noted at this time. Patient has been tolerating the dressing changes without complication. Patient has no evidence of dementia 05/19/17 on evaluation today patient appears to be doing very well regard to her bilateral lower extremity ulcers. In fact she seems to be improving and in fact healed in a couple of locations that were giving her trouble last week. Overall I'm extremely pleased with how things have progressed. She still has some discomfort especially in the right lateral lower extremity. Fortunately there does not appear to be any evidence of significant infection which is good news she does tell me that she has used her compression pumps several times although not routinely over the past week I did praise her for this. I do think it will be beneficial and that might even be what's helping with her wounds as far  as healing is concerned at least to a degree. No fevers, chills, nausea, or vomiting noted at this time. Patient has no dementia and no evidence of infection. 05/26/17 on evaluation today patient presents for evaluation concerning her bilateral lower extremity ulcer secondary to lymphedema. Fortunately she appears to be doing very well at this point in time. Specifically her right leg appears to be completely closed and healed. The left leg though there is still a small area of opening overall has healed. She does have a little bit of excoriation at the top or the wraps are because they slid down on her. Otherwise there's no evidence of infection and the other has improved dramatically now that she is not draining as significantly. Patient is having no significant pain she tells me she has been able to use her lymphedema pumps one time a day although that's not all she can tolerate. Even that is  difficult for her. 06/02/17 on evaluation today patient's ulcers appear to be completely closed at this point. She does still note some odor but I believe this is due to the fact she has not been able to wash her legs as well currently. Fortunately she did bring her compression with her today. She does have the EXTREMIT-EASE Compression. With that being said she is somewhat nervous about going back to this as previously she broke out yet again once we discontinue wrapping her. READMISSION 11/23/17; patient is now 73 year old diabetic woman with severe chronic venous insufficiency with lymphedema. We care for her last in this clinic in February 2019 at which time she had chronic venous insufficiency with lymphedema bilateral lower extremity ulcers. We discharged her with bilateral wrap around/extremitease stockings and compression pumps. Is fairly clear she is not using the compression pumps stating it causes pain in her knees. In any case she was admitted to hospital from 8/3 through 8/6 with a several week history of recurrent bilateral ulcers. She was felt to have bilateral cellulitis treated with doxycycline IV. She is still on oral doxycycline and has another 2 days worth. At discharge her white count was 14.1 hemoglobin at 8.9. The patient is a diabetic however her last ABIs in 2018 showed a right ABI of 1.21, left of 1.35. TBI's were 0.76 on the right 0.75 on the left. Posterior tibial artery was monophasic on the right triphasic on the left. Dorsalis pedis triphasic on the right and triphasic on the left. She is not felt to have significant macrovascular disease 12/02/17;significant bilateral lower extremity wounds secondary to severe uncontrolled lymphedema. She did not get home health out to change the wraps. So she kept this on all week. She is telling me she using his her compression pumps once a week. In general all the wounds look better. We've been using silver alginate with secondary  absorptive dressings 4 layer compression 12/10/17; bilateral lower extremity wounds secondary to uncontrolled lymphedema. She tells me she is using her compression pumps on most days. We've been keeping wraps on all week. Using silver alginate. She has wounds on the right lateral calf left medial and left lateral calf. All of this is somewhat better 12/16/17; the patient has 3 small open areas. One on the right lateral calf, one on the left medial calf and the small area on the left lateral calf. She has lymphedema, chronic venous insufficiency with hemosiderin deposition. Very fibrotic distal lower extremity scan with suggestion of an inverted bottle sign appearance to her legs. She has compression pumps but she does  not use them has not used them in the last week. Currently we have her in 4 layer compression, silver alginate to the wounds and this is being changed once by home health. She is noncompliant with the compression pumps because she says it hurts her knees. She also has compression stockings ando Juxta light stockings at home 12/24/17; the patient's right lateral calf is healed as well as the left medial. There is no open wound on the right leg. She still has 2 areas on the left lateral calf. We transitioned the right leg into an extremitease stocking which she brought in from home. 12/31/17; the patient has a superficial wound on the left lateral lower calf. We've been wrapping this leg. She arrives today with the right leg swollen but without a wound. She cannot get her extremities stocking on the right leg. There is a lot more edema here. She is not using her compression pumps 01/10/2018; patient has a same superficial wound on the left lateral calf. Her right leg has less peripheral edema. She tells me she also started herself on some Lasix that had been previously prescribed for her at some point but she had never taken it. She is also concerned about whether her compression pumps are  leaking. She has extremitease stockings however the edema gets bad enough she can get these on. Currently she has no open wound on the right leg and is mention the edema is actually better than when I saw this 10 days ago 01/17/2018; patient has a same superficial area on the posterior left calf and a weeping area in the mid part of the right lateral calf. She still does not have anyone from [facility] coming to see her compression pumps which she describes as being suspicious for a leakage. I am not really sure how frequently she is using this in any case. She has massive bilateral calf lymphedema. I think there is some spreading lymphedema into her posterior thighs. 01/24/2018; the patient's edema in her legs is actually some better. She still has a smaller area on the left posterior calf although this is better. The weeping area on the right lateral calf is also better. As it turned out she did not get her pumps from [facility] but [facility] and they are going to try to go out and see these this week which is going to help. I explained to her that she will use the compression pumps once a day while we are wrapping her legs but when she transitions to stockings that may be she will require pumping twice a day. Her compliance in this area has not been high and I wonder about her ability to do this herself. 02/01/2018; the area on the posterior left leg is healed. She still has the original wound on the right lateral calf which is epithelialized. The however just above this there is still weeping lymphedema fluid. She has a new area on the upper right calf which is either wrap injury or is she points out where she has been scratching under the wraps. We still not have managed to get a hold of a representative of [facility] to see if the pump is operational. She has extremitease stockings at home however I am not of the opinion that this will maintain her skin  integrity without the compression pumps. 02/08/2018; posterior left leg remains healed although she has a new small open area on the left lateral calf. Right lateral calf has two quarter  sized open areas and close juxtaposition. There is less weeping edema fluid. She apparently managed to contact [facility]. They are sending her a part. But she is still not received it 02/15/2018; the patient has no open wound on either leg. Some scale present on the right upper lateral calf just below the fibular head. Edema control is excellent. She did not bring her stockings or her juxta lite wrap around. She has the part for her compression pump but she has not use the compression pumps yet READMISSION 01/10/2019 Patient is now a 73 year old woman that we have had in the clinic multiple times in the past. She has chronic lower extremity lymphedema, recurrent wounds on her bilateral lower extremities. She is also a type II diabetic reasonably well controlled with a recent hemoglobin A1c while she was in the hospital of 5.9. She has wounds on her bilateral lower extremities mostly on the left lateral and right lateral. These were noted also during her recent hospitalization. The patient states they have been there for a while. She has not been able to wear her stockings he has trouble getting them on as she lives alone. She also has external compression pumps but uses them sparingly because it causes pain in her knees which hurt because of osteoarthritis. The patient was on doxycycline before she went into the hospital apparently for fear of infection in her legs and is on Keflex coming out because of a UTI The patient was recently in the hospital from 01/06/2019 through 01/09/2019. She was admitted with nausea and vomiting and prerenal azotemia. This was corrected with IV fluid her Lasix has been put on hold. Hemoglobin A1c at 5.9 her metformin was discontinued. She was also felt to have a UTI she was  prescribed Keflex at 503 times a day she is picking that up today. The patient is not felt to have an arterial issue. ABIs in our clinic were 1.24 on the right and 1.06 on the left. She had formal studies in 2018 at which time her ABIs were 1.21 on the right TBI of 0.76 and on the left 1.35 and 0.75 respectively. Waveforms are on the right were monophasic and biphasic, triphasic and biphasic on the left. She is tolerated 4 layer compression in the past 10/13. Comes in today having not used her compression pumps /perhaps once in the last week. She has too much edema to consider healing these wounds. We have been using silver alginate 10/20; she tells me he had she used her compression pumps once a day as we asked. Clearly she has edema out of her legs although most of it seems to be settling around her knees which is what she complains about. We have been using silver alginate to the extensive de-epithelialized area on the left lateral calf and a much smaller area on the right lateral extending linearly into the right posterior Electronic Signature(s) Signed: 01/24/2019 6:17:56 PM By: Linton Ham MD Entered By: Linton Ham on 01/24/2019 [id number] -------------------------------------------------------------------------------- Physical Exam Details Patient Name: Date of Service: [name] [name], [name] [name] 01/24/2019 8:00 AM Medical Record [id number] Patient Account Number: [id number] Date of Birth/Sex: Treating RN: [birth year] (73 y.o. [name] [name] Primary Care Provider: [name] Other Clinician: Referring Provider: Treating Provider/Extender:Lasalle Abee, Tenna Child, [place] Siren in Treatment: 2 Constitutional Patient is hypertensive.. Pulse regular and within target range for patient.Marland Kitchen Respirations regular, non-labored and within target range.. Temperature is normal and within the target range for the patient.Marland Kitchen Appears in no distress. Eyes Conjunctivae clear. No  discharge.no icterus. Respiratory work of breathing is normal. Cardiovascular Pedal pulses are palpable.. Much better edema control bilaterally. Severe changes of chronic venous insufficiency and lymphedema. Musculoskeletal I do not think there is a problem with her knee joints per se there is swelling around the knee which I think is coming from her lower extremities. Integumentary (Hair, Skin) Chronic hemosiderin deposition. Psychiatric appears at normal baseline. Notes Wound exam; left lateral extensive open area with erythema. Smaller linear area on the right lateral calf extending posteriorly. No debridement was required in any of the areas. Electronic Signature(s) Signed: 01/24/2019 6:17:56 PM By: Linton Ham MD Entered By: Linton Ham on 01/24/2019 09:10:09 -------------------------------------------------------------------------------- Physician Orders Details Patient Name: Date of Service: [name] [name], [name] [name] 01/24/2019 8:00 AM Medical Record [id number] Patient Account Number: [id number] Date of Birth/Sex: Treating RN: 05-02-1945 (73 y.o. [name] [name] Primary Care Provider: [name] Other Clinician: Referring Provider: Treating Provider/Extender:Navin Dogan, Tenna Child, [place] Siren in Treatment: 2 Verbal / Phone Orders: No Diagnosis Coding ICD-10 Coding Code Description I89.0 Lymphedema, not elsewhere classified L97.221 Non-pressure chronic ulcer of left calf limited to breakdown of skin L97.211 Non-pressure chronic ulcer of right calf limited to breakdown of skin E11.622 Type 2 diabetes mellitus with other skin ulcer Follow-up Appointments Return Appointment in 1 week. Dressing Change Frequency Do not change entire dressing for one week. Skin Barriers/Peri-Wound Care TCA Cream or Ointment Wound Cleansing May shower and wash wound with soap and water. Primary Wound Dressing Wound #28 Right,Lateral Lower Leg Calcium Alginate with  Silver Wound #29 Right,Posterior Lower Leg Calcium Alginate with Silver Wound #30 Right,Distal,Lateral Lower Leg Calcium Alginate with Silver Wound #31 Right,Distal,Posterior Lower Leg Calcium Alginate with Silver Wound #32 Left,Anterior Lower Leg Calcium Alginate with Silver Wound #33 Left,Proximal,Lateral Lower Leg Calcium Alginate with Silver Wound #34 Left,Distal,Lateral Lower Leg Calcium Alginate with Silver Wound #35 Left,Distal,Anterior Lower Leg Calcium Alginate with Silver Secondary Dressing Wound #28 Right,Lateral Lower Leg ABD pad Wound #29 Right,Posterior Lower Leg ABD pad Wound #30 Right,Distal,Lateral Lower Leg ABD pad Wound #31 Right,Distal,Posterior Lower Leg ABD pad Wound #32 Left,Anterior Lower Leg ABD pad Wound #33 Left,Proximal,Lateral Lower Leg ABD pad Wound #34 Left,Distal,Lateral Lower Leg ABD pad Wound #35 Left,Distal,Anterior Lower Leg ABD pad Edema Control 4 layer compression - Bilateral Electronic Signature(s) Signed: 01/24/2019 6:17:56 PM By: Linton Ham MD Signed: 03/14/2019 3:01:15 PM By: Carlene Coria RN Entered By: Carlene Coria on 01/24/2019 08:57:22 -------------------------------------------------------------------------------- Problem List Details Patient Name: Date of Service: [name] [name], [name] [name] 01/24/2019 8:00 AM Medical Record [id number] Patient Account Number: [id number] Date of Birth/Sex: Treating RN: [birth year] (73 y.o. [name] [name], [name] [name] Primary Care Provider: [name] Other Clinician: Referring Provider: Treating Provider/Extender:Helmi Hechavarria, Tenna Child, [place] Siren in Treatment: 2 Active Problems ICD-10 Evaluated Encounter Code Description Active Date Today Diagnosis I89.0 Lymphedema, not elsewhere classified 01/10/2019 No Yes L97.221 Non-pressure chronic ulcer of left calf limited to 01/10/2019 No Yes breakdown of skin L97.211 Non-pressure chronic ulcer of right calf limited to 01/10/2019 No  Yes breakdown of skin E11.622 Type 2 diabetes mellitus with other skin ulcer 01/10/2019 No Yes Inactive Problems Resolved Problems Electronic Signature(s) Signed: 01/24/2019 6:17:56 PM By: Linton Ham MD Entered By: Linton Ham on 01/24/2019 09:04:56 -------------------------------------------------------------------------------- Progress Note Details Patient Name: Date of Service: [name] [name], [name] [name] 01/24/2019 8:00 AM Medical Record [id number] Patient Account Number: [id number] Date of Birth/Sex: Treating RN: April 10, 1945 (73 y.o. [name] [name] Primary Care Provider: [name] Other Clinician: Referring Provider: Treating Provider/Extender:Brihana Quickel, Tenna Child, [place] Siren in Treatment: 2 Subjective History of  Present Illness (HPI) 04/09/15; the patient is here for review of wounds on her bilateral lower extremities for the last month. The patient has a history of lymphedema and bilateral chronic venous insufficiency with inflammation she was here for review of wounds on her bilateral legs in 2014 she was discharged home with external compression pumps which she is currently not using. Over the last month she developed wounds on her bilateral lower legs with drainage and pain and she is here for our review of this. 04/16/15; the patient's edema is under much better control. She has 3 areas one on the right anterior medial leg one on the left posterior leg and a small open area with purulent drainage on the left anterior leg. I have cultured the area on the left anterior leg 04/23/15; continued improvement in the patient's edema. All her wounds of closed I see no open areas. Culture from the left anterior leg last time was negative. READMISSION 02/07/16; this patient is a woman that I saw her earlier in 2017. I believe we discharged her very quickly with bilateral wounds in her legs secondary to chronic venous hypertension with inflammation and secondary  lymphedema. She had juxtalite stockings. She has external compression pumps at home from a stay in our clinic in 2014. She tells me that she's had wounds on her bilateral lower legs since March or April of this year. 3 wounds on the left 2 on the right. She is a type II diabetic and has a history of noncompressible vessels bilaterally although she is tolerated 4-layer compression. The last note from her primary physician Dr. [name] was in May. At that point Dr. [name] had referred the patient here for lymphedema management. The patient stated she made a phone call to year but was not able to arrange an appointment. It is not clear that she is actually using anything on the wounds currently except some gauze that was saturated with drainage per our intake nurse. She is certainly not using her juxtalite stockings and by the patient's own admission she is not using her compression pumps because they are "irritating". I don't see a recent hemoglobin A1c. The patient states she has not been systemically unwell but the legs are painful 02/14/16; patient tolerated her Profore wraps reasonably well. She is using the compression pumps 1 time per day. Measurements of her legs are better in terms of the amount of lymphedema 02/21/16; patient tolerated her Profore wraps it. She says she is using her compression pumps one or 2 times a day. Measurements of her leg circumference are improved and most of her wound dimensions are better, unfortunately the home health that we referred her to did not except her insurance but works successful in getting her accompanied that would except her insurance however she apparently has a $25 co-pay which she cannot afford as she is on a fixed income 03/05/16; patient states she did not back using the pumps. She is healed that 2 wounds on the left leg. She still has areas on the right lateral lower leg, right medial lower leg and left lateral lower leg. All of  these look some better and have reduced in area 03/13/16; still 3 wounds 1 on the left lateral leg, one on the left medial/posterior leg which is really the most extensive and a small area on the right lateral leg. All of this in the setting of severe chronic lymphedema and chronic venous inflammation. Damage to the skin with cobblestone thickened skin. 03/20/16;  still 2 areas. The area on the left lateral leg appears to of closed over. The right medial leg is still most extensive wound although it appears to be closing over right lateral only a small open area remains. Using silver alginate Profore wraps 03/27/16; the area on the left leg remains closed. The edema control is better on the left than the right. The right medial leg is still weeping but everything appears to be epithelialized. The lateral aspect of the right leg appears closed. She tells me she has old juxtalite stockings. She has been compliant with her compression pumps. 04/03/16; the area on the left leg remains closed. She has ordered new juxtalite stockings. The right posterior medial wound is fully epithelialized. However she has an irritated area over the right Achilles that she states is been itching all week under the wraps.. She has been compliant with her compression pumps 04/10/16; the area on the left leg remains closed. The right posterior medial wound is also closed today. The irritated area over the right Achilles which was probably wrap injury is also fully epithelialized and closed. The patient has not received her new juxtalite stockings, she arrives in clinic today with an old juxtalite stocking in place. Sheis using her compression pumps secondary to her severe stage III lymphedema READMISSION 01/21/17; this is a patient we know from several previous stays in this clinic. She has lymphedema with chronic venous insufficiency and inflammation. The last time she was here we discharged her with bilateral juxta lites  which she is compliant with. She also has external compression pumps at home from stays in our clinic in 2014 although she tells me that she is not very compliant with these as when she uses them a causes pain in her bilateral knees the patient states that her wounds on her left greater than right leg including 2 large areas on the left anterior and left lateral leg and an area on the right leg open in late June or July since then she is been followed by [facility] and she's had bilateral Unna boot supplied every week for the last month.. I think the wounds have actually improved although they are not specifically dressed. She states she had an ultrasound to rule out DVT bilaterally that was negative. She does not have arterial studies although she is a diabetic. She states she has a lot of pain in her legs she states she does not use her external compression pumps because it causes pain in her knees. As usual her ABIs were noncompressible bilaterally in this clinic 01/28/17; currently the patient only has one small wound on the left lateral leg and I think this should be healed next week. We are going to try to get her bilateral extremitease stockings. She tells me that compression pumps heard her posterior rib arthritic knees so she is not been compliant with this 02/04/17; the patient's wounds are totally healed. She has chronic venous insufficiency and secondary lymphedema. She has new extremitease stockings. She also has compression pumps Readmission: 04/21/17 on evaluation today patient appears to be doing about the same as what she was previous when we were evaluating her back in November 2018. At that point she had completely healed. With that being said she has now reopened and states that she wasn't aware that she was supposed to come back. She unfortunately is having a difficult time utilizing her lymphedema pumps as she states it hurts her knees where she has bone on  bone  osteoarthritis. Subsequently she also states that although she has the compression that we got for her previously the extremitease stockings she nonetheless doesn't always wear these and being noncompliant often has things will reopen as far as ulcers on her lower extremities. No fevers, chills, nausea, or vomiting noted at this time. Patient has no evidence of dementia. She is having some discomfort in regard to the ulcers at this point. 04/28/17 on evaluation today patient appears to be doing significantly better in regard to her wounds. With that being said she is still having significant discomfort and is not really keen on sharp debridement. She does not appear to have any evidence of infection which is good news I do think the Iodoflex is beneficial for her. Of note her ABI's were found in the system to be on the right 1.21 with a TBI of 0.76 and on the left 1.35 with a TBI of 0.75. In general her swelling appears to be significantly improved however. No fevers, chills, nausea, or vomiting noted at this time. Patient has been tolerating the wraps without complication. She has no evidence of dementia. 05/05/17-she is here in follow-up evaluation for bilateral lower extremity venous and lymphedema ulcers. She states she has not been using her lymphedema pumps secondary to pain at her knees. After discussion it was noted that she uses knee-high lymphedema pumps but does have a pair of thigh-high lymphedema pumps. She was strongly advised to use the thigh-high lymphedema pumps and if necessary reduce the setting for improved tolerance. She reports no change in odor and/or drainage. She continues to tolerate sharp debridement poorly with significant pain, primarily to the right lower extremity. We will continue with current treatment plan, along with improved lymphedema pump use and follow-up next week 05/12/17 on evaluation today patient actually appears to be doing much better in regard to her  bilateral lower extremity ulcers. Only the right altar actually requires debridement today. The other two cleaned off nicely in two of the three in fact on the left lower extremity or actually almost completely healed. She does have some weeping noted still the bilateral lower extremities left greater than right. She still is not able to use her compression pumps even though I have mentioned this several times to her as did Leah last week. No fevers, chills, nausea, or vomiting noted at this time. Patient has been tolerating the dressing changes without complication. Patient has no evidence of dementia 05/19/17 on evaluation today patient appears to be doing very well regard to her bilateral lower extremity ulcers. In fact she seems to be improving and in fact healed in a couple of locations that were giving her trouble last week. Overall I'm extremely pleased with how things have progressed. She still has some discomfort especially in the right lateral lower extremity. Fortunately there does not appear to be any evidence of significant infection which is good news she does tell me that she has used her compression pumps several times although not routinely over the past week I did praise her for this. I do think it will be beneficial and that might even be what's helping with her wounds as far as healing is concerned at least to a degree. No fevers, chills, nausea, or vomiting noted at this time. Patient has no dementia and no evidence of infection. 05/26/17 on evaluation today patient presents for evaluation concerning her bilateral lower extremity ulcer secondary to lymphedema. Fortunately she appears to be doing very well at this point in time.  Specifically her right leg appears to be completely closed and healed. The left leg though there is still a small area of opening overall has healed. She does have a little bit of excoriation at the top or the wraps are because they slid down on  her. Otherwise there's no evidence of infection and the other has improved dramatically now that she is not draining as significantly. Patient is having no significant pain she tells me she has been able to use her lymphedema pumps one time a day although that's not all she can tolerate. Even that is difficult for her. 06/02/17 on evaluation today patient's ulcers appear to be completely closed at this point. She does still note some odor but I believe this is due to the fact she has not been able to wash her legs as well currently. Fortunately she did bring her compression with her today. She does have the EXTREMIT-EASE Compression. With that being said she is somewhat nervous about going back to this as previously she broke out yet again once we discontinue wrapping her. READMISSION 11/23/17; patient is now 73 year old diabetic woman with severe chronic venous insufficiency with lymphedema. We care for her last in this clinic in February 2019 at which time she had chronic venous insufficiency with lymphedema bilateral lower extremity ulcers. We discharged her with bilateral wrap around/extremitease stockings and compression pumps. Is fairly clear she is not using the compression pumps stating it causes pain in her knees. In any case she was admitted to hospital from 8/3 through 8/6 with a several week history of recurrent bilateral ulcers. She was felt to have bilateral cellulitis treated with doxycycline IV. She is still on oral doxycycline and has another 2 days worth. At discharge her white count was 14.1 hemoglobin at 8.9. The patient is a diabetic however her last ABIs in 2018 showed a right ABI of 1.21, left of 1.35. TBI's were 0.76 on the right 0.75 on the left. Posterior tibial artery was monophasic on the right triphasic on the left. Dorsalis pedis triphasic on the right and triphasic on the left. She is not felt to have significant macrovascular disease 12/02/17;significant bilateral  lower extremity wounds secondary to severe uncontrolled lymphedema. She did not get home health out to change the wraps. So she kept this on all week. She is telling me she using his her compression pumps once a week. In general all the wounds look better. We've been using silver alginate with secondary absorptive dressings 4 layer compression 12/10/17; bilateral lower extremity wounds secondary to uncontrolled lymphedema. She tells me she is using her compression pumps on most days. We've been keeping wraps on all week. Using silver alginate. She has wounds on the right lateral calf left medial and left lateral calf. All of this is somewhat better 12/16/17; the patient has 3 small open areas. One on the right lateral calf, one on the left medial calf and the small area on the left lateral calf. She has lymphedema, chronic venous insufficiency with hemosiderin deposition. Very fibrotic distal lower extremity scan with suggestion of an inverted bottle sign appearance to her legs. She has compression pumps but she does not use them has not used them in the last week. Currently we have her in 4 layer compression, silver alginate to the wounds and this is being changed once by home health. She is noncompliant with the compression pumps because she says it hurts her knees. She also has compression stockings ando Juxta light stockings at home  12/24/17; the patient's right lateral calf is healed as well as the left medial. There is no open wound on the right leg. She still has 2 areas on the left lateral calf. We transitioned the right leg into an extremitease stocking which she brought in from home. 12/31/17; the patient has a superficial wound on the left lateral lower calf. We've been wrapping this leg. She arrives today with the right leg swollen but without a wound. She cannot get her extremities stocking on the right leg. There is a lot more edema here. She is not using her compression pumps 01/10/2018;  patient has a same superficial wound on the left lateral calf. Her right leg has less peripheral edema. She tells me she also started herself on some Lasix that had been previously prescribed for her at some point but she had never taken it. She is also concerned about whether her compression pumps are leaking. She has extremitease stockings however the edema gets bad enough she can get these on. Currently she has no open wound on the right leg and is mention the edema is actually better than when I saw this 10 days ago 01/17/2018; patient has a same superficial area on the posterior left calf and a weeping area in the mid part of the right lateral calf. She still does not have anyone from [facility] coming to see her compression pumps which she describes as being suspicious for a leakage. I am not really sure how frequently she is using this in any case. She has massive bilateral calf lymphedema. I think there is some spreading lymphedema into her posterior thighs. 01/24/2018; the patient's edema in her legs is actually some better. She still has a smaller area on the left posterior calf although this is better. The weeping area on the right lateral calf is also better. As it turned out she did not get her pumps from [facility] but [facility] and they are going to try to go out and see these this week which is going to help. I explained to her that she will use the compression pumps once a day while we are wrapping her legs but when she transitions to stockings that may be she will require pumping twice a day. Her compliance in this area has not been high and I wonder about her ability to do this herself. 02/01/2018; the area on the posterior left leg is healed. She still has the original wound on the right lateral calf which is epithelialized. The however just above this there is still weeping lymphedema fluid. She has a new area on the upper right calf which is either wrap  injury or is she points out where she has been scratching under the wraps. We still not have managed to get a hold of a representative of [facility] to see if the pump is operational. She has extremitease stockings at home however I am not of the opinion that this will maintain her skin integrity without the compression pumps. 02/08/2018; posterior left leg remains healed although she has a new small open area on the left lateral calf. Right lateral calf has two quarter sized open areas and close juxtaposition. There is less weeping edema fluid. She apparently managed to contact [facility]. They are sending her a part. But she is still not received it 02/15/2018; the patient has no open wound on either leg. Some scale present on the right upper lateral calf just below the fibular head. Edema control  is excellent. She did not bring her stockings or her juxta lite wrap around. She has the part for her compression pump but she has not use the compression pumps yet READMISSION 01/10/2019 Patient is now a 73 year old woman that we have had in the clinic multiple times in the past. She has chronic lower extremity lymphedema, recurrent wounds on her bilateral lower extremities. She is also a type II diabetic reasonably well controlled with a recent hemoglobin A1c while she was in the hospital of 5.9. She has wounds on her bilateral lower extremities mostly on the left lateral and right lateral. These were noted also during her recent hospitalization. The patient states they have been there for a while. She has not been able to wear her stockings he has trouble getting them on as she lives alone. She also has external compression pumps but uses them sparingly because it causes pain in her knees which hurt because of osteoarthritis. The patient was on doxycycline before she went into the hospital apparently for fear of infection in her legs and is on Keflex coming out because of a UTI The  patient was recently in the hospital from 01/06/2019 through 01/09/2019. She was admitted with nausea and vomiting and prerenal azotemia. This was corrected with IV fluid her Lasix has been put on hold. Hemoglobin A1c at 5.9 her metformin was discontinued. She was also felt to have a UTI she was prescribed Keflex at 503 times a day she is picking that up today. The patient is not felt to have an arterial issue. ABIs in our clinic were 1.24 on the right and 1.06 on the left. She had formal studies in 2018 at which time her ABIs were 1.21 on the right TBI of 0.76 and on the left 1.35 and 0.75 respectively. Waveforms are on the right were monophasic and biphasic, triphasic and biphasic on the left. She is tolerated 4 layer compression in the past 10/13. Comes in today having not used her compression pumps /perhaps once in the last week. She has too much edema to consider healing these wounds. We have been using silver alginate 10/20; she tells me he had she used her compression pumps once a day as we asked. Clearly she has edema out of her legs although most of it seems to be settling around her knees which is what she complains about. We have been using silver alginate to the extensive de-epithelialized area on the left lateral calf and a much smaller area on the right lateral extending linearly into the right posterior Objective Constitutional Patient is hypertensive.. Pulse regular and within target range for patient.Marland Kitchen Respirations regular, non-labored and within target range.. Temperature is normal and within the target range for the patient.Marland Kitchen Appears in no distress. Vitals Time Taken: 8:14 AM, Height: 71 in, Weight: 240 lbs, BMI: 33.5, Temperature: 97.7 F, Pulse: 91 bpm, Respiratory Rate: 18 breaths/min, Blood Pressure: 164/67 mmHg. Eyes Conjunctivae clear. No discharge.no icterus. Respiratory work of breathing is normal. Cardiovascular Pedal pulses are palpable.. Much better edema control  bilaterally. Severe changes of chronic venous insufficiency and lymphedema. Musculoskeletal I do not think there is a problem with her knee joints per se there is swelling around the knee which I think is coming from her lower extremities. Psychiatric appears at normal baseline. General Notes: Wound exam; left lateral extensive open area with erythema. Smaller linear area on the right lateral calf extending posteriorly. No debridement was required in any of the areas. Integumentary (Hair, Skin) Chronic hemosiderin  deposition. Wound #28 status is Open. Original cause of wound was Gradually Appeared. The wound is located on the Right,Lateral Lower Leg. The wound measures 2cm length x 10.2cm width x 0.1cm depth; 16.022cm^2 area and 1.602cm^3 volume. There is Fat Layer (Subcutaneous Tissue) Exposed exposed. There is no tunneling or undermining noted. There is a medium amount of purulent drainage noted. The wound margin is flat and intact. There is medium (34-66%) red granulation within the wound bed. There is a medium (34-66%) amount of necrotic tissue within the wound bed including Adherent Slough. Wound #29 status is Open. Original cause of wound was Gradually Appeared. The wound is located on the Right,Posterior Lower Leg. The wound measures 1.3cm length x 1cm width x 0.1cm depth; 1.021cm^2 area and 0.102cm^3 volume. There is Fat Layer (Subcutaneous Tissue) Exposed exposed. There is no tunneling or undermining noted. There is a medium amount of purulent drainage noted. The wound margin is flat and intact. There is large (67-100%) red granulation within the wound bed. There is a small (1-33%) amount of necrotic tissue within the wound bed including Adherent Slough. Wound #30 status is Open. Original cause of wound was Gradually Appeared. The wound is located on the Right,Distal,Lateral Lower Leg. The wound measures 0.5cm length x 0.7cm width x 0.1cm depth; 0.275cm^2 area and 0.027cm^3 volume.  There is Fat Layer (Subcutaneous Tissue) Exposed exposed. There is no tunneling or undermining noted. There is a medium amount of purulent drainage noted. The wound margin is flat and intact. There is medium (34-66%) pink, pale granulation within the wound bed. There is a medium (34-66%) amount of necrotic tissue within the wound bed including Adherent Slough. Wound #31 status is Open. Original cause of wound was Gradually Appeared. The wound is located on the Right,Distal,Posterior Lower Leg. The wound measures 1cm length x 1cm width x 0.1cm depth; 0.785cm^2 area and 0.079cm^3 volume. There is Fat Layer (Subcutaneous Tissue) Exposed exposed. There is no tunneling or undermining noted. There is a medium amount of purulent drainage noted. The wound margin is flat and intact. There is medium (34-66%) red granulation within the wound bed. There is a medium (34-66%) amount of necrotic tissue within the wound bed including Adherent Slough. Wound #32 status is Open. Original cause of wound was Gradually Appeared. The wound is located on the Left,Anterior Lower Leg. The wound measures 1.2cm length x 0.7cm width x 0.1cm depth; 0.66cm^2 area and 0.066cm^3 volume. There is Fat Layer (Subcutaneous Tissue) Exposed exposed. There is no tunneling or undermining noted. There is a medium amount of purulent drainage noted. The wound margin is flat and intact. There is small (1-33%) red granulation within the wound bed. There is a large (67-100%) amount of necrotic tissue within the wound bed including Adherent Slough. Wound #33 status is Open. Original cause of wound was Gradually Appeared. The wound is located on the Left,Proximal,Lateral Lower Leg. The wound measures 3.5cm length x 10cm width x 0.1cm depth; 27.489cm^2 area and 2.749cm^3 volume. There is Fat Layer (Subcutaneous Tissue) Exposed exposed. There is no tunneling or undermining noted. There is a medium amount of purulent drainage noted. The wound  margin is flat and intact. There is small (1-33%) red, pink granulation within the wound bed. There is a large (67-100%) amount of necrotic tissue within the wound bed including Adherent Slough. Wound #34 status is Open. Original cause of wound was Gradually Appeared. The wound is located on the Left,Distal,Lateral Lower Leg. The wound measures 1.9cm length x 1.9cm width x 0.1cm  depth; 2.835cm^2 area and 0.284cm^3 volume. There is Fat Layer (Subcutaneous Tissue) Exposed exposed. There is no tunneling or undermining noted. There is a medium amount of purulent drainage noted. The wound margin is flat and intact. There is small (1-33%) red granulation within the wound bed. There is a large (67-100%) amount of necrotic tissue within the wound bed including Adherent Slough. Wound #35 status is Open. Original cause of wound was Gradually Appeared. The wound is located on the [employer] Lower Leg. The wound measures 0.4cm length x 0.6cm width x 0.1cm depth; 0.188cm^2 area and 0.019cm^3 volume. There is Fat Layer (Subcutaneous Tissue) Exposed exposed. There is no tunneling or undermining noted. There is a medium amount of purulent drainage noted. There is small (1-33%) pink granulation within the wound bed. There is a large (67-100%) amount of necrotic tissue within the wound bed including Adherent Slough. 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 There was a Four Layer Compression Therapy Procedure by Carlene Coria, RN. Post procedure Diagnosis Wound #28: Same as Pre-Procedure Wound #29 Pre-procedure diagnosis of Wound #29 is a Lymphedema located on the  Right,Posterior Lower Leg . There was a Four Layer Compression Therapy Procedure by Carlene Coria, RN. Post procedure Diagnosis Wound #29: Same as Pre-Procedure Wound #30 Pre-procedure diagnosis of Wound #30 is a Lymphedema located on the Right,Distal,Lateral Lower Leg . There was a Four Layer Compression Therapy Procedure by Carlene Coria, RN. Post procedure Diagnosis Wound #30: Same as Pre-Procedure Wound #31 Pre-procedure diagnosis of Wound #31 is a Lymphedema located on the Right,Distal,Posterior Lower Leg . There was a Four Layer Compression Therapy Procedure by Carlene Coria, RN. Post procedure Diagnosis Wound #31: Same as Pre-Procedure Wound #32 Pre-procedure diagnosis of Wound #32 is a Lymphedema located on the Left,Anterior Lower Leg . There was a Four Layer Compression Therapy Procedure by Carlene Coria, RN. Post procedure Diagnosis Wound #32: Same as Pre-Procedure Wound #33 Pre-procedure diagnosis of Wound #33 is a Lymphedema located on the Left,Proximal,Lateral Lower Leg . There was a Four Layer Compression Therapy Procedure by Carlene Coria, RN. Post procedure Diagnosis Wound #33: Same as Pre-Procedure Wound #34 Pre-procedure diagnosis of Wound #34 is a Lymphedema located on the Left,Distal,Lateral Lower Leg . There was a Four Layer Compression Therapy Procedure by Carlene Coria, RN. Post procedure Diagnosis Wound #34: Same as Pre-Procedure Wound #35 Pre-procedure diagnosis of Wound #35 is a Lymphedema located on the Left,Distal,Anterior Lower Leg . There was a Four Layer Compression Therapy Procedure by Carlene Coria, RN. Post procedure Diagnosis Wound #35: Same as Pre-Procedure Plan Follow-up Appointments: Return Appointment in 1 week. Dressing Change Frequency: Do not change entire dressing for one week. Skin Barriers/Peri-Wound Care: TCA Cream or Ointment Wound Cleansing: May shower and wash wound with soap and water. Primary Wound Dressing: Wound #28 Right,Lateral  Lower Leg: Calcium Alginate with Silver Wound #29 Right,Posterior Lower Leg: Calcium Alginate with Silver Wound #30 Right,Distal,Lateral Lower Leg: Calcium Alginate with Silver Wound #31 Right,Distal,Posterior Lower Leg: Calcium Alginate with Silver Wound #32 Left,Anterior Lower Leg: Calcium Alginate with Silver Wound #33 Left,Proximal,Lateral Lower Leg: Calcium Alginate with Silver Wound #34 Left,Distal,Lateral Lower Leg: Calcium Alginate with Silver Wound #35 Left,Distal,Anterior Lower Leg: Calcium Alginate with Silver Secondary  Dressing: Wound #28 Right,Lateral Lower Leg: ABD pad Wound #29 Right,Posterior Lower Leg: ABD pad Wound #30 Right,Distal,Lateral Lower Leg: ABD pad Wound #31 Right,Distal,Posterior Lower Leg: ABD pad Wound #32 Left,Anterior Lower Leg: ABD pad Wound #33 Left,Proximal,Lateral Lower Leg: ABD pad Wound #34 Left,Distal,Lateral Lower Leg: ABD pad Wound #35 Left,Distal,Anterior Lower Leg: ABD pad Edema Control: 4 layer compression - Bilateral 1. Continue with silver alginate/ABDs/Profore 4-layer compression bilaterally 2. I have asked her to continue to use her pumps for 1 hour once a day Electronic Signature(s) Signed: 01/24/2019 6:17:56 PM By: Linton Ham MD Entered By: Linton Ham on 01/24/2019 09:10:58 -------------------------------------------------------------------------------- SuperBill Details Patient Name: Date of Service: [name] [name], [name] [name] 01/24/2019 Medical Record [id number] Patient Account Number: [id number] Date of Birth/Sex: Treating RN: January 20, 1946 (73 y.o. [name] [name] Primary Care Provider: [name] Other Clinician: Referring Provider: Treating Provider/Extender:Byron Peacock, Tenna Child, [place] Si[place] in Treatment: 2 Diagnosis Coding ICD-10 Codes Code Description I89.0 Lymphedema, not elsewhere classified L97.221 Non-pressure chronic ulcer of left calf limited to breakdown of skin L97.211  Non-pressure chronic ulcer of right calf limited to breakdown of skin E11.622 Type 2 diabetes mellitus with other skin ulcer Facility Procedures Physician Procedures CPT4 Code Description: [id number] - WC PHYS LEVEL 3 - EST PT ICD-10 Diagnosis Description L97.221 Non-pressure chronic ulcer of left calf limited to br L97.211 Non-pressure chronic ulcer of right calf limited to b I89.0 Lymphedema, not elsewhere  classified Modifier: eakdown of skin reakdown of skin Quantity: 1 Electronic Signature(s) Signed: 01/24/2019 6:17:56 PM By: Linton Ham MD Entered By: Linton Ham on 01/24/2019 [place]

## 2019-03-14 NOTE — Progress Notes (Signed)
[name], [name] ([id number]) Visit Report for 03/07/2019 HPI Details Patient Name: Date of Service: [name], [name] 03/07/2019 1:00 PM Medical Record [id number] Patient Account Number: [id number] Date of Birth/Sex: Treating RN: Oct 25, 1945 (73 y.o. F) Primary Care Provider: Pricilla [name] Other Clinician: Referring Provider: Treating Provider/Extender:, [name] [name] in Treatment: 8 History of Present Illness HPI Description: 04/09/15; the patient is here for review of wounds on her bilateral lower extremities for the last month. The patient has a history of lymphedema and bilateral chronic venous insufficiency with inflammation she was here for review of wounds on her bilateral legs in 2014 she was discharged home with external compression pumps which she is currently not using. Over the last month she developed wounds on her bilateral lower legs with drainage and pain and she is here for our review of this. 04/16/15; the patient's edema is under much better control. She has 3 areas one on the right anterior medial leg one on the left posterior leg and a small open area with purulent drainage on the left anterior leg. I have cultured the area on the left anterior leg 04/23/15; continued improvement in the patient's edema. All her wounds of closed I see no open areas. Culture from the left anterior leg last time was negative. READMISSION 02/07/16; this patient is a woman that I saw her earlier in 2017. I believe we discharged her very quickly with bilateral wounds in her legs secondary to chronic venous hypertension with inflammation and secondary lymphedema. She had juxtalite stockings. She has external compression pumps at home from a stay in our clinic in 2014. She tells me that she's had wounds on her bilateral lower legs since March or April of this year. 3 wounds on the left 2 on the right. She is a type II diabetic and has a history of  noncompressible vessels bilaterally although she is tolerated 4-layer compression. The last note from her primary physician Dr. Pricilla [name] was in May. At that point Dr. [name] had referred the patient here for lymphedema management. The patient stated she made a phone call to year but was not able to arrange an appointment. It is not clear that she is actually using anything on the wounds currently except some gauze that was saturated with drainage per our intake nurse. She is certainly not using her juxtalite stockings and by the patient's own admission she is not using her compression pumps because they are "irritating". I don't see a recent hemoglobin A1c. The patient states she has not been systemically unwell but the legs are painful 02/14/16; patient tolerated her Profore wraps reasonably well. She is using the compression pumps 1 time per day. Measurements of her legs are better in terms of the amount of lymphedema 02/21/16; patient tolerated her Profore wraps it. She says she is using her compression pumps one or 2 times a day. Measurements of her leg circumference are improved and most of her wound dimensions are better, unfortunately the home health that we referred her to did not except her insurance but works successful in getting her accompanied that would except her insurance however she apparently has a $25 co-pay which she cannot afford as she is on a fixed income 03/05/16; patient states she did not back using the pumps. She is healed that 2 wounds on the left leg. She still has areas on the right lateral lower leg, right medial lower leg and left lateral lower leg. All of these look some better and have reduced in  area 03/13/16; still 3 wounds 1 on the left lateral leg, one on the left medial/posterior leg which is really the most extensive and a small area on the right lateral leg. All of this in the setting of severe chronic lymphedema and chronic  venous inflammation. Damage to the skin with cobblestone thickened skin. 03/20/16; still 2 areas. The area on the left lateral leg appears to of closed over. The right medial leg is still most extensive wound although it appears to be closing over right lateral only a small open area remains. Using silver alginate Profore wraps 03/27/16; the area on the left leg remains closed. The edema control is better on the left than the right. The right medial leg is still weeping but everything appears to be epithelialized. The lateral aspect of the right leg appears closed. She tells me she has old juxtalite stockings. She has been compliant with her compression pumps. 04/03/16; the area on the left leg remains closed. She has ordered new juxtalite stockings. The right posterior medial wound is fully epithelialized. However she has an irritated area over the right Achilles that she states is been itching all week under the wraps.. She has been compliant with her compression pumps 04/10/16; the area on the left leg remains closed. The right posterior medial wound is also closed today. The irritated area over the right Achilles which was probably wrap injury is also fully epithelialized and closed. The patient has not received her new juxtalite stockings, she arrives in clinic today with an old juxtalite stocking in place. Sheis using her compression pumps secondary to her severe stage III lymphedema READMISSION 01/21/17; this is a patient we know from several previous stays in this clinic. She has lymphedema with chronic venous insufficiency and inflammation. The last time she was here we discharged her with bilateral juxta lites which she is compliant with. She also has external compression pumps at home from stays in our clinic in 2014 although she tells me that she is not very compliant with these as when she uses them a causes pain in her bilateral knees the patient states that her wounds on her left  greater than right leg including 2 large areas on the left anterior and left lateral leg and an area on the right leg open in late June or July since then she is been followed by [facility] and she's had bilateral Unna boot supplied every week for the last month.. I think the wounds have actually improved although they are not specifically dressed. She states she had an ultrasound to rule out DVT bilaterally that was negative. She does not have arterial studies although she is a diabetic. She states she has a lot of pain in her legs she states she does not use her external compression pumps because it causes pain in her knees. As usual her ABIs were noncompressible bilaterally in this clinic 01/28/17; currently the patient only has one small wound on the left lateral leg and I think this should be healed next week. We are going to try to get her bilateral extremitease stockings. She tells me that compression pumps heard her posterior rib arthritic knees so she is not been compliant with this 02/04/17; the patient's wounds are totally healed. She has chronic venous insufficiency and secondary lymphedema. She has new extremitease stockings. She also has compression pumps Readmission: 04/21/17 on evaluation today patient appears to be doing about the same as what she was previous when we were evaluating her back  in November 2018. At that point she had completely healed. With that being said she has now reopened and states that she wasn't aware that she was supposed to come back. 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 Subsequently she also states that although she has the compression that we got for her previously the extremitease stockings she nonetheless doesn't always wear these and being noncompliant often has things will reopen as far as ulcers on her lower extremities. No fevers,  chills, nausea, or vomiting noted at this time. Patient has no evidence of dementia. She is having some discomfort in regard to the ulcers at this point. 04/28/17 on evaluation today patient appears to be doing significantly better in regard to her wounds. With that being said she is still having significant discomfort and is not really keen on sharp debridement. She does not appear to have any evidence of infection which is good news I do think the Iodoflex is beneficial for her. Of note her ABI's were found in the system to be on the right 1.21 with a TBI of 0.76 and on the left 1.35 with a TBI of 0.75. In general her swelling appears to be significantly improved however. No fevers, chills, nausea, or vomiting noted at this time. Patient has been tolerating the wraps without complication. She has no evidence of dementia. 05/05/17-she is here in follow-up evaluation for bilateral lower extremity venous and lymphedema ulcers. She states she has not been using her lymphedema pumps secondary to pain at her knees. After discussion it was noted that she uses knee-high lymphedema pumps but does have a pair of thigh-high lymphedema pumps. She was strongly advised to use the thigh-high lymphedema pumps and if necessary reduce the setting for improved tolerance. She reports no change in odor and/or drainage. She continues to tolerate sharp debridement poorly with significant pain, primarily to the right lower extremity. We will continue with current treatment plan, along with improved lymphedema pump use and follow-up next week 05/12/17 on evaluation today patient actually appears to be doing much better in regard to her bilateral lower extremity ulcers. Only the right altar actually requires debridement today. The other two cleaned off nicely in two of the three in fact on the left lower extremity or actually almost completely healed. She does have some weeping noted still the bilateral lower extremities left  greater than right. She still is not able to use her compression pumps even though I have mentioned this several times to her as did Leah last week. No fevers, chills, nausea, or vomiting noted at this time. Patient has been tolerating the dressing changes without complication. Patient has no evidence of dementia 05/19/17 on evaluation today patient appears to be doing very well regard to her bilateral lower extremity ulcers. In fact she seems to be improving and in fact healed in a couple of locations that were giving her trouble last week. Overall I'm extremely pleased with how things have progressed. She still has some discomfort especially in the right lateral lower extremity. Fortunately there does not appear to be any evidence of significant infection which is good news she does tell me that she has used her compression pumps several times although not routinely over the past week I did praise her for this. I do think it will be beneficial and that might even be what's helping with her wounds as far as healing  is concerned at least to a degree. No fevers, chills, nausea, or vomiting noted at this time. Patient has no dementia and no evidence of infection. 05/26/17 on evaluation today patient presents for evaluation concerning her bilateral lower extremity ulcer secondary to lymphedema. Fortunately she appears to be doing very well at this point in time. Specifically her right leg appears to be completely closed and healed. The left leg though there is still a small area of opening overall has healed. She does have a little bit of excoriation at the top or the wraps are because they slid down on her. Otherwise there's no evidence of infection and the other has improved dramatically now that she is not draining as significantly. Patient is having no significant pain she tells me she has been able to use her lymphedema pumps one time a day although that's not all she can tolerate. Even that is  difficult for her. 06/02/17 on evaluation today patient's ulcers appear to be completely closed at this point. She does still note some odor but I believe this is due to the fact she has not been able to wash her legs as well currently. Fortunately she did bring her compression with her today. She does have the EXTREMIT-EASE Compression. With that being said she is somewhat nervous about going back to this as previously she broke out yet again once we discontinue wrapping her. READMISSION 11/23/17; patient is now 73 year old diabetic woman with severe chronic venous insufficiency with lymphedema. We care for her last in this clinic in February 2019 at which time she had chronic venous insufficiency with lymphedema bilateral lower extremity ulcers. We discharged her with bilateral wrap around/extremitease stockings and compression pumps. Is fairly clear she is not using the compression pumps stating it causes pain in her knees. In any case she was admitted to hospital from 8/3 through 8/6 with a several week history of recurrent bilateral ulcers. She was felt to have bilateral cellulitis treated with doxycycline IV. She is still on oral doxycycline and has another 2 days worth. At discharge her white count was 14.1 hemoglobin at 8.9. The patient is a diabetic however her last ABIs in 2018 showed a right ABI of 1.21, left of 1.35. TBI's were 0.76 on the right 0.75 on the left. Posterior tibial artery was monophasic on the right triphasic on the left. Dorsalis pedis triphasic on the right and triphasic on the left. She is not felt to have significant macrovascular disease 12/02/17;significant bilateral lower extremity wounds secondary to severe uncontrolled lymphedema. She did not get home health out to change the wraps. So she kept this on all week. She is telling me she using his her compression pumps once a week. In general all the wounds look better. We've been using silver alginate with secondary  absorptive dressings 4 layer compression 12/10/17; bilateral lower extremity wounds secondary to uncontrolled lymphedema. She tells me she is using her compression pumps on most days. We've been keeping wraps on all week. Using silver alginate. She has wounds on the right lateral calf left medial and left lateral calf. All of this is somewhat better 12/16/17; the patient has 3 small open areas. One on the right lateral calf, one on the left medial calf and the small area on the left lateral calf. She has lymphedema, chronic venous insufficiency with hemosiderin deposition. Very fibrotic distal lower extremity scan with suggestion of an inverted bottle sign appearance to her legs. She has compression pumps but she does not use  them has not used them in the last week. Currently we have her in 4 layer compression, silver alginate to the wounds and this is being changed once by home health. She is noncompliant with the compression pumps because she says it hurts her knees. She also has compression stockings ando Juxta light stockings at home 12/24/17; the patient's right lateral calf is healed as well as the left medial. There is no open wound on the right leg. She still has 2 areas on the left lateral calf. We transitioned the right leg into an extremitease stocking which she brought in from home. 12/31/17; the patient has a superficial wound on the left lateral lower calf. We've been wrapping this leg. She arrives today with the right leg swollen but without a wound. She cannot get her extremities stocking on the right leg. There is a lot more edema here. She is not using her compression pumps 01/10/2018; patient has a same superficial wound on the left lateral calf. Her right leg has less peripheral edema. She tells me she also started herself on some Lasix that had been previously prescribed for her at some point but she had never taken it. She is also concerned about whether her compression pumps are  leaking. She has extremitease stockings however the edema gets bad enough she can get these on. Currently she has no open wound on the right leg and is mention the edema is actually better than when I saw this 10 days ago 01/17/2018; patient has a same superficial area on the posterior left calf and a weeping area in the mid part of the right lateral calf. She still does not have anyone from [facility] coming to see her compression pumps which she describes as being suspicious for a leakage. I am not really sure how frequently she is using this in any case. She has massive bilateral calf lymphedema. I think there is some spreading lymphedema into her posterior thighs. 01/24/2018; the patient's edema in her legs is actually some better. She still has a smaller area on the left posterior calf although this is better. The weeping area on the right lateral calf is also better. As it turned out she did not get her pumps from [facility] but [facility] and they are going to try to go out and see these this week which is going to help. I explained to her that she will use the compression pumps once a day while we are wrapping her legs but when she transitions to stockings that may be she will require pumping twice a day. Her compliance in this area has not been high and I wonder about her ability to do this herself. 02/01/2018; the area on the posterior left leg is healed. She still has the original wound on the right lateral calf which is epithelialized. The however just above this there is still weeping lymphedema fluid. She has a new area on the upper right calf which is either wrap injury or is she points out where she has been scratching under the wraps. We still not have managed to get a hold of a representative of [facility] to see if the pump is operational. She has extremitease stockings at home however I am not of the opinion that this will maintain her skin  integrity without the compression pumps. 02/08/2018; posterior left leg remains healed although she has a new small open area on the left lateral calf. Right lateral calf has two quarter sized open  areas and close juxtaposition. There is less weeping edema fluid. She apparently managed to contact [facility]. They are sending her a part. But she is still not received it 02/15/2018; the patient has no open wound on either leg. Some scale present on the right upper lateral calf just below the fibular head. Edema control is excellent. She did not bring her stockings or her juxta lite wrap around. She has the part for her compression pump but she has not use the compression pumps yet READMISSION 01/10/2019 Patient is now a 73 year old woman that we have had in the clinic multiple times in the past. She has chronic lower extremity lymphedema, recurrent wounds on her bilateral lower extremities. She is also a type II diabetic reasonably well controlled with a recent hemoglobin A1c while she was in the hospital of 5.9. She has wounds on her bilateral lower extremities mostly on the left lateral and right lateral. These were noted also during her recent hospitalization. The patient states they have been there for a while. She has not been able to wear her stockings he has trouble getting them on as she lives alone. She also has external compression pumps but uses them sparingly because it causes pain in her knees which hurt because of osteoarthritis. The patient was on doxycycline before she went into the hospital apparently for fear of infection in her legs and is on Keflex coming out because of a UTI The patient was recently in the hospital from 01/06/2019 through 01/09/2019. She was admitted with nausea and vomiting and prerenal azotemia. This was corrected with IV fluid her Lasix has been put on hold. Hemoglobin A1c at 5.9 her metformin was discontinued. She was also felt to have a UTI she was  prescribed Keflex at 503 times a day she is picking that up today. The patient is not felt to have an arterial issue. ABIs in our clinic were 1.24 on the right and 1.06 on the left. She had formal studies in 2018 at which time her ABIs were 1.21 on the right TBI of 0.76 and on the left 1.35 and 0.75 respectively. Waveforms are on the right were monophasic and biphasic, triphasic and biphasic on the left. She is tolerated 4 layer compression in the past 10/13. Comes in today having not used her compression pumps /perhaps once in the last week. She has too much edema to consider healing these wounds. We have been using silver alginate 10/20; she tells me he had she used her compression pumps once a day as we asked. Clearly she has edema out of her legs although most of it seems to be settling around her knees which is what she complains about. We have been using silver alginate to the extensive de-epithelialized area on the left lateral calf and a much smaller area on the right lateral extending linearly into the right posterior 10/27; she is using her compression pumps daily. Her edema control is better. We have been using silver alginate to the extensive wound areas on her bilateral lower legs. This includes left anterior left lateral and left posterior. Right anterior right lateral and right posterior. The wounds are not all in the same condition. She has severe bilateral skin damage with the epithelialization, flaking dried epithelium 11/3; she is using her compression pumps 1 times daily. Pretty obvious that her wraps fell down especially on the left leg to mid calf. I think things are improving on the right lateral calf, I do not see anything open  posteriorly. The major areas on the left lateral where she has 3 or 4 deeper wounds in the midst of an entire de-epithelialized area. There is erythema here but no tenderness I think this represents venous stasis rather than cellulitis. I debrided  the areas on the left lateral calf last week but once again they have tightly adherent debris over the top of them which is disappointing 11/10; the patient's major areas on the left lateral calf to 3 wounds with some depth surrounded by a large area of de-epithelialized tissue. There is no infection. We have been using silver alginate under 4-layer compression. On the right lateral she has a more contained wound area that is superficial. Been using silver alginate under 4-layer compression 11/17; the patient's major wound is on the left lateral calf. We have been using silver alginate under compression. There is no open wound per se on the right but it de-epithelialized area on the lateral calf that is weeping edema fluid. I do not see much change here today. 12/1; patient arrives in clinic with her wounds looking better. Her edema control is better. The lymphedema specialist working on the left leg we have been working on the right. We have been using 4-layer compression. The patient is using a consider external compression pumps and Hydrofera Blue to the open areas. She is being measured for an abdominal compression pump and apparently that is going to go through her Musician) Signed: 03/07/2019 6:45:39 PM By: Linton Ham MD Entered By: Linton Ham on 03/07/2019 14:53:06 -------------------------------------------------------------------------------- Physical Exam Details Patient Name: Date of Service: [name] [name], [name] [name] 03/07/2019 1:00 PM Medical Record [id number] Patient Account Number: [id number] Date of Birth/Sex: Treating RN: 04-17-1945 (73 y.o. F) Primary Care Provider: Pricilla [name] Other Clinician: Referring Provider: Treating Provider/Extender:, [name] [name] in Treatment: 8 Constitutional Patient is hypertensive.. Pulse regular and within target range for patient.Marland Kitchen Respirations regular, non-labored  and within target range.. Temperature is normal and within the target range for the patient.Marland Kitchen Appears in no distress. Respiratory work of breathing is normal. Cardiovascular Pedal pulses palpable and strong bilaterally.. Much better edema control. Integumentary (Hair, Skin) Much better edema control. Severe lymphedema chronic venous insufficiency. Psychiatric appears at normal baseline. Notes Wound exam; patient's major areas on the left lateral calf although it appears smaller. She has a smaller area below this towards her ankle. On the right she has an area on the right lateral lower calf a small area posteriorly. All of this looking considerably better Electronic Signature(s) Signed: 03/07/2019 6:45:39 PM By: Linton Ham MD Entered By: Linton Ham on 03/07/2019 14:55:17 -------------------------------------------------------------------------------- Physician Orders Details Patient Name: Date of Service: [name] [name], [name] [name] 03/07/2019 1:00 PM Medical Record [id number] Patient Account Number: [id number] Date of Birth/Sex: Treating RN: [birth year] (73 y.o. [name] Primary Care Provider: Pricilla [name] Other Clinician: Referring Provider: Treating Provider/Extender:, [name] [name] in Treatment: 8 Verbal / Phone Orders: No Diagnosis Coding ICD-10 Coding Code Description I89.0 Lymphedema, not elsewhere classified L97.221 Non-pressure chronic ulcer of left calf limited to breakdown of skin L97.211 Non-pressure chronic ulcer of right calf limited to breakdown of skin E11.622 Type 2 diabetes mellitus with other skin ulcer Follow-up Appointments Return Appointment in 2 weeks. Nurse Visit: - 1 week Skin Barriers/Peri-Wound Care Barrier cream Moisturizing lotion TCA Cream or Ointment Wound Cleansing May shower and wash wound with soap and water. Primary Wound Dressing Wound #28 Right,Lateral Lower Leg Hydrofera Blue - ready Wound  #29 Right,Posterior Lower Leg Hydrofera Blue -  ready Wound #31 Right,Distal,Posterior Lower Leg Hydrofera Blue - ready Wound #32 Left,Anterior Lower Leg Hydrofera Blue - ready Wound #33 Left,Proximal,Lateral Lower Leg Hydrofera Blue - ready Wound #34 Left,Distal,Lateral Lower Leg Hydrofera Blue - ready Secondary Dressing Wound #28 Right,Lateral Lower Leg ABD pad Wound #29 Right,Posterior Lower Leg ABD pad Wound #31 Right,Distal,Posterior Lower Leg ABD pad Wound #32 Left,Anterior Lower Leg ABD pad Wound #33 Left,Proximal,Lateral Lower Leg ABD pad Wound #34 Left,Distal,Lateral Lower Leg ABD pad Edema Control 4 layer compression - Right Lower Extremity - unna boot at top to help secure wrap Other: - left leg: PT to apply lymphedema wraps Electronic Signature(s) Signed: 03/07/2019 6:45:39 PM By: Linton Ham MD Signed: 03/14/2019 2:56:39 PM By: Carlene Coria RN Entered By: Carlene Coria on 03/07/2019 13:15:36 -------------------------------------------------------------------------------- Problem List Details Patient Name: Date of Service: [name] [name], [name] [name] 03/07/2019 1:00 PM Medical Record [id number] Patient Account Number: [id number] Date of Birth/Sex: Treating RN: November 13, 1945 (73 y.o. [place] [name] [name], [place] Primary Care Provider: Pricilla [name] Other Clinician: Referring Provider: Treating Provider/Extender:, [name] [name] in Treatment: 8 Active Problems ICD-10 Evaluated Encounter Code Description Active Date Today Diagnosis I89.0 Lymphedema, not elsewhere classified 01/10/2019 No Yes L97.221 Non-pressure chronic ulcer of left calf limited to 01/10/2019 No Yes breakdown of skin L97.211 Non-pressure chronic ulcer of right calf limited to 01/10/2019 No Yes breakdown of skin E11.622 Type 2 diabetes mellitus with other skin ulcer 01/10/2019 No Yes Inactive Problems Resolved Problems Electronic Signature(s) Signed: 03/07/2019 6:45:39 PM By:  Linton Ham MD Entered By: Linton Ham on 03/07/2019 14:51:01 -------------------------------------------------------------------------------- Progress Note Details Patient Name: Date of Service: [name] [name], [name] [name] 03/07/2019 1:00 PM Medical Record [id number] Patient Account Number: [id number] Date of Birth/Sex: Treating RN: Aug 24, 1945 (73 y.o. F) Primary Care Provider: Pricilla [name] Other Clinician: Referring Provider: Treating Provider/Extender:, [name] [name] in Treatment: 8 Subjective History of Present Illness (HPI) 04/09/15; the patient is here for review of wounds on her bilateral lower extremities for the last month. The patient has a history of lymphedema and bilateral chronic venous insufficiency with inflammation she was here for review of wounds on her bilateral legs in 2014 she was discharged home with external compression pumps which she is currently not using. Over the last month she developed wounds on her bilateral lower legs with drainage and pain and she is here for our review of this. 04/16/15; the patient's edema is under much better control. She has 3 areas one on the right anterior medial leg one on the left posterior leg and a small open area with purulent drainage on the left anterior leg. I have cultured the area on the left anterior leg 04/23/15; continued improvement in the patient's edema. All her wounds of closed I see no open areas. Culture from the left anterior leg last time was negative. READMISSION 02/07/16; this patient is a woman that I saw her earlier in 2017. I believe we discharged her very quickly with bilateral wounds in her legs secondary to chronic venous hypertension with inflammation and secondary lymphedema. She had juxtalite stockings. She has external compression pumps at home from a stay in our clinic in 2014. She tells me that she's had wounds on her bilateral lower legs since March or April of this  year. 3 wounds on the left 2 on the right. She is a type II diabetic and has a history of noncompressible vessels bilaterally although she is tolerated 4-layer compression. The last note from her primary physician Dr. Pricilla [name] was in May. At that  point Dr. [name] had referred the patient here for lymphedema management. The patient stated she made a phone call to year but was not able to arrange an appointment. It is not clear that she is actually using anything on the wounds currently except some gauze that was saturated with drainage per our intake nurse. She is certainly not using her juxtalite stockings and by the patient's own admission she is not using her compression pumps because they are "irritating". I don't see a recent hemoglobin A1c. The patient states she has not been systemically unwell but the legs are painful 02/14/16; patient tolerated her Profore wraps reasonably well. She is using the compression pumps 1 time per day. Measurements of her legs are better in terms of the amount of lymphedema 02/21/16; patient tolerated her Profore wraps it. She says she is using her compression pumps one or 2 times a day. Measurements of her leg circumference are improved and most of her wound dimensions are better, unfortunately the home health that we referred her to did not except her insurance but works successful in getting her accompanied that would except her insurance however she apparently has a $25 co-pay which she cannot afford as she is on a fixed income 03/05/16; patient states she did not back using the pumps. She is healed that 2 wounds on the left leg. She still has areas on the right lateral lower leg, right medial lower leg and left lateral lower leg. All of these look some better and have reduced in area 03/13/16; still 3 wounds 1 on the left lateral leg, one on the left medial/posterior leg which is really the most extensive and a small area on the right lateral  leg. All of this in the setting of severe chronic lymphedema and chronic venous inflammation. Damage to the skin with cobblestone thickened skin. 03/20/16; still 2 areas. The area on the left lateral leg appears to of closed over. The right medial leg is still most extensive wound although it appears to be closing over right lateral only a small open area remains. Using silver alginate Profore wraps 03/27/16; the area on the left leg remains closed. The edema control is better on the left than the right. The right medial leg is still weeping but everything appears to be epithelialized. The lateral aspect of the right leg appears closed. She tells me she has old juxtalite stockings. She has been compliant with her compression pumps. 04/03/16; the area on the left leg remains closed. She has ordered new juxtalite stockings. The right posterior medial wound is fully epithelialized. However she has an irritated area over the right Achilles that she states is been itching all week under the wraps.. She has been compliant with her compression pumps 04/10/16; the area on the left leg remains closed. The right posterior medial wound is also closed today. The irritated area over the right Achilles which was probably wrap injury is also fully epithelialized and closed. The patient has not received her new juxtalite stockings, she arrives in clinic today with an old juxtalite stocking in place. Sheis using her compression pumps secondary to her severe stage III lymphedema READMISSION 01/21/17; this is a patient we know from several previous stays in this clinic. She has lymphedema with chronic venous insufficiency and inflammation. The last time she was here we discharged her with bilateral juxta lites which she is compliant with. She also has external compression pumps at home from stays in our clinic in 2014 although she tells  me that she is not very compliant with these as when she uses them a causes pain  in her bilateral knees the patient states that her wounds on her left greater than right leg including 2 large areas on the left anterior and left lateral leg and an area on the right leg open in late June or July since then she is been followed by [facility] and she's had bilateral Unna boot supplied every week for the last month.. I think the wounds have actually improved although they are not specifically dressed. She states she had an ultrasound to rule out DVT bilaterally that was negative. She does not have arterial studies although she is a diabetic. She states she has a lot of pain in her legs she states she does not use her external compression pumps because it causes pain in her knees. As usual her ABIs were noncompressible bilaterally in this clinic 01/28/17; currently the patient only has one small wound on the left lateral leg and I think this should be healed next week. We are going to try to get her bilateral extremitease stockings. She tells me that compression pumps heard her posterior rib arthritic knees so she is not been compliant with this 02/04/17; the patient's wounds are totally healed. She has chronic venous insufficiency and secondary lymphedema. She has new extremitease stockings. She also has compression pumps Readmission: 04/21/17 on evaluation today patient appears to be doing about the same as what she was previous when we were evaluating her back in November 2018. At that point she had completely healed. With that being said she has now reopened and states that she wasn't aware that she was supposed to come back. 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 Subsequently she also states that although she has the compression that we got for her previously the extremitease stockings she nonetheless doesn't always wear these and being noncompliant often has  things will reopen as far as ulcers on her lower extremities. No fevers, chills, nausea, or vomiting noted at this time. Patient has no evidence of dementia. She is having some discomfort in regard to the ulcers at this point. 04/28/17 on evaluation today patient appears to be doing significantly better in regard to her wounds. With that being said she is still having significant discomfort and is not really keen on sharp debridement. She does not appear to have any evidence of infection which is good news I do think the Iodoflex is beneficial for her. Of note her ABI's were found in the system to be on the right 1.21 with a TBI of 0.76 and on the left 1.35 with a TBI of 0.75. In general her swelling appears to be significantly improved however. No fevers, chills, nausea, or vomiting noted at this time. Patient has been tolerating the wraps without complication. She has no evidence of dementia. 05/05/17-she is here in follow-up evaluation for bilateral lower extremity venous and lymphedema ulcers. She states she has not been using her lymphedema pumps secondary to pain at her knees. After discussion it was noted that she uses knee-high lymphedema pumps but does have a pair of thigh-high lymphedema pumps. She was strongly advised to use the thigh-high lymphedema pumps and if necessary reduce the setting for improved tolerance. She reports no change in odor and/or drainage. She continues to tolerate sharp debridement poorly with significant pain, primarily to the right  lower extremity. We will continue with current treatment plan, along with improved lymphedema pump use and follow-up next week 05/12/17 on evaluation today patient actually appears to be doing much better in regard to her bilateral lower extremity ulcers. Only the right altar actually requires debridement today. The other two cleaned off nicely in two of the three in fact on the left lower extremity or actually almost completely healed.  She does have some weeping noted still the bilateral lower extremities left greater than right. She still is not able to use her compression pumps even though I have mentioned this several times to her as did Leah last week. No fevers, chills, nausea, or vomiting noted at this time. Patient has been tolerating the dressing changes without complication. Patient has no evidence of dementia 05/19/17 on evaluation today patient appears to be doing very well regard to her bilateral lower extremity ulcers. In fact she seems to be improving and in fact healed in a couple of locations that were giving her trouble last week. Overall I'm extremely pleased with how things have progressed. She still has some discomfort especially in the right lateral lower extremity. Fortunately there does not appear to be any evidence of significant infection which is good news she does tell me that she has used her compression pumps several times although not routinely over the past week I did praise her for this. I do think it will be beneficial and that might even be what's helping with her wounds as far as healing is concerned at least to a degree. No fevers, chills, nausea, or vomiting noted at this time. Patient has no dementia and no evidence of infection. 05/26/17 on evaluation today patient presents for evaluation concerning her bilateral lower extremity ulcer secondary to lymphedema. Fortunately she appears to be doing very well at this point in time. Specifically her right leg appears to be completely closed and healed. The left leg though there is still a small area of opening overall has healed. She does have a little bit of excoriation at the top or the wraps are because they slid down on her. Otherwise there's no evidence of infection and the other has improved dramatically now that she is not draining as significantly. Patient is having no significant pain she tells me she has been able to use her lymphedema  pumps one time a day although that's not all she can tolerate. Even that is difficult for her. 06/02/17 on evaluation today patient's ulcers appear to be completely closed at this point. She does still note some odor but I believe this is due to the fact she has not been able to wash her legs as well currently. Fortunately she did bring her compression with her today. She does have the EXTREMIT-EASE Compression. With that being said she is somewhat nervous about going back to this as previously she broke out yet again once we discontinue wrapping her. READMISSION 11/23/17; patient is now 73 year old diabetic woman with severe chronic venous insufficiency with lymphedema. We care for her last in this clinic in February 2019 at which time she had chronic venous insufficiency with lymphedema bilateral lower extremity ulcers. We discharged her with bilateral wrap around/extremitease stockings and compression pumps. Is fairly clear she is not using the compression pumps stating it causes pain in her knees. In any case she was admitted to hospital from 8/3 through 8/6 with a several week history of recurrent bilateral ulcers. She was felt to have bilateral cellulitis treated with doxycycline  IV. She is still on oral doxycycline and has another 2 days worth. At discharge her white count was 14.1 hemoglobin at 8.9. The patient is a diabetic however her last ABIs in 2018 showed a right ABI of 1.21, left of 1.35. TBI's were 0.76 on the right 0.75 on the left. Posterior tibial artery was monophasic on the right triphasic on the left. Dorsalis pedis triphasic on the right and triphasic on the left. She is not felt to have significant macrovascular disease 12/02/17;significant bilateral lower extremity wounds secondary to severe uncontrolled lymphedema. She did not get home health out to change the wraps. So she kept this on all week. She is telling me she using his her compression pumps once a week. In general  all the wounds look better. We've been using silver alginate with secondary absorptive dressings 4 layer compression 12/10/17; bilateral lower extremity wounds secondary to uncontrolled lymphedema. She tells me she is using her compression pumps on most days. We've been keeping wraps on all week. Using silver alginate. She has wounds on the right lateral calf left medial and left lateral calf. All of this is somewhat better 12/16/17; the patient has 3 small open areas. One on the right lateral calf, one on the left medial calf and the small area on the left lateral calf. She has lymphedema, chronic venous insufficiency with hemosiderin deposition. Very fibrotic distal lower extremity scan with suggestion of an inverted bottle sign appearance to her legs. She has compression pumps but she does not use them has not used them in the last week. Currently we have her in 4 layer compression, silver alginate to the wounds and this is being changed once by home health. She is noncompliant with the compression pumps because she says it hurts her knees. She also has compression stockings ando Juxta light stockings at home 12/24/17; the patient's right lateral calf is healed as well as the left medial. There is no open wound on the right leg. She still has 2 areas on the left lateral calf. We transitioned the right leg into an extremitease stocking which she brought in from home. 12/31/17; the patient has a superficial wound on the left lateral lower calf. We've been wrapping this leg. She arrives today with the right leg swollen but without a wound. She cannot get her extremities stocking on the right leg. There is a lot more edema here. She is not using her compression pumps 01/10/2018; patient has a same superficial wound on the left lateral calf. Her right leg has less peripheral edema. She tells me she also started herself on some Lasix that had been previously prescribed for her at some point but she had  never taken it. She is also concerned about whether her compression pumps are leaking. She has extremitease stockings however the edema gets bad enough she can get these on. Currently she has no open wound on the right leg and is mention the edema is actually better than when I saw this 10 days ago 01/17/2018; patient has a same superficial area on the posterior left calf and a weeping area in the mid part of the right lateral calf. She still does not have anyone from [facility] coming to see her compression pumps which she describes as being suspicious for a leakage. I am not really sure how frequently she is using this in any case. She has massive bilateral calf lymphedema. I think there is some spreading lymphedema into her posterior thighs. 01/24/2018; the patient's  edema in her legs is actually some better. She still has a smaller area on the left posterior calf although this is better. The weeping area on the right lateral calf is also better. As it turned out she did not get her pumps from [facility] but [facility] and they are going to try to go out and see these this week which is going to help. I explained to her that she will use the compression pumps once a day while we are wrapping her legs but when she transitions to stockings that may be she will require pumping twice a day. Her compliance in this area has not been high and I wonder about her ability to do this herself. 02/01/2018; the area on the posterior left leg is healed. She still has the original wound on the right lateral calf which is epithelialized. The however just above this there is still weeping lymphedema fluid. She has a new area on the upper right calf which is either wrap injury or is she points out where she has been scratching under the wraps. We still not have managed to get a hold of a representative of [facility] to see if the pump is operational. She has extremitease stockings at  home however I am not of the opinion that this will maintain her skin integrity without the compression pumps. 02/08/2018; posterior left leg remains healed although she has a new small open area on the left lateral calf. Right lateral calf has two quarter sized open areas and close juxtaposition. There is less weeping edema fluid. She apparently managed to contact [facility]. They are sending her a part. But she is still not received it 02/15/2018; the patient has no open wound on either leg. Some scale present on the right upper lateral calf just below the fibular head. Edema control is excellent. She did not bring her stockings or her juxta lite wrap around. She has the part for her compression pump but she has not use the compression pumps yet READMISSION 01/10/2019 Patient is now a 73 year old woman that we have had in the clinic multiple times in the past. She has chronic lower extremity lymphedema, recurrent wounds on her bilateral lower extremities. She is also a type II diabetic reasonably well controlled with a recent hemoglobin A1c while she was in the hospital of 5.9. She has wounds on her bilateral lower extremities mostly on the left lateral and right lateral. These were noted also during her recent hospitalization. The patient states they have been there for a while. She has not been able to wear her stockings he has trouble getting them on as she lives alone. She also has external compression pumps but uses them sparingly because it causes pain in her knees which hurt because of osteoarthritis. The patient was on doxycycline before she went into the hospital apparently for fear of infection in her legs and is on Keflex coming out because of a UTI The patient was recently in the hospital from 01/06/2019 through 01/09/2019. She was admitted with nausea and vomiting and prerenal azotemia. This was corrected with IV fluid her Lasix has been put on hold. Hemoglobin A1c at 5.9 her  metformin was discontinued. She was also felt to have a UTI she was prescribed Keflex at 503 times a day she is picking that up today. The patient is not felt to have an arterial issue. ABIs in our clinic were 1.24 on the right and 1.06 on the left. She had  formal studies in 2018 at which time her ABIs were 1.21 on the right TBI of 0.76 and on the left 1.35 and 0.75 respectively. Waveforms are on the right were monophasic and biphasic, triphasic and biphasic on the left. She is tolerated 4 layer compression in the past 10/13. Comes in today having not used her compression pumps /perhaps once in the last week. She has too much edema to consider healing these wounds. We have been using silver alginate 10/20; she tells me he had she used her compression pumps once a day as we asked. Clearly she has edema out of her legs although most of it seems to be settling around her knees which is what she complains about. We have been using silver alginate to the extensive de-epithelialized area on the left lateral calf and a much smaller area on the right lateral extending linearly into the right posterior 10/27; she is using her compression pumps daily. Her edema control is better. We have been using silver alginate to the extensive wound areas on her bilateral lower legs. This includes left anterior left lateral and left posterior. Right anterior right lateral and right posterior. The wounds are not all in the same condition. She has severe bilateral skin damage with the epithelialization, flaking dried epithelium 11/3; she is using her compression pumps 1 times daily. Pretty obvious that her wraps fell down especially on the left leg to mid calf. I think things are improving on the right lateral calf, I do not see anything open posteriorly. The major areas on the left lateral where she has 3 or 4 deeper wounds in the midst of an entire de-epithelialized area. There is erythema here but no tenderness I think  this represents venous stasis rather than cellulitis. I debrided the areas on the left lateral calf last week but once again they have tightly adherent debris over the top of them which is disappointing 11/10; the patient's major areas on the left lateral calf to 3 wounds with some depth surrounded by a large area of de-epithelialized tissue. There is no infection. We have been using silver alginate under 4-layer compression. On the right lateral she has a more contained wound area that is superficial. Been using silver alginate under 4-layer compression 11/17; the patient's major wound is on the left lateral calf. We have been using silver alginate under compression. There is no open wound per se on the right but it de-epithelialized area on the lateral calf that is weeping edema fluid. I do not see much change here today. 12/1; patient arrives in clinic with her wounds looking better. Her edema control is better. The lymphedema specialist working on the left leg we have been working on the right. We have been using 4-layer compression. The patient is using a consider external compression pumps and Hydrofera Blue to the open areas. She is being measured for an abdominal compression pump and apparently that is going to go through her insurance Objective Constitutional Patient is hypertensive.. Pulse regular and within target range for patient.Marland Kitchen Respirations regular, non-labored and within target range.. Temperature is normal and within the target range for the patient.Marland Kitchen Appears in no distress. Vitals Time Taken: 1:55 PM, Height: 71 in, Weight: 240 lbs, BMI: 33.5, Temperature: 97.9 F, Pulse: 62 bpm, Respiratory Rate: 20 breaths/min, Blood Pressure: 143/87 mmHg. Respiratory work of breathing is normal. Cardiovascular Pedal pulses palpable and strong bilaterally.. Much better edema control. Psychiatric appears at normal baseline. General Notes: Wound exam; patient's major areas on  the left  lateral calf although it appears smaller. She has a smaller area below this towards her ankle. On the right she has an area on the right lateral lower calf a small area posteriorly. All of this looking considerably better Integumentary (Hair, Skin) Much better edema control. Severe lymphedema chronic venous insufficiency. Wound #28 status is Open. Original cause of wound was Gradually Appeared. The wound is located on the Right,Lateral Lower Leg. The wound measures 2.3cm length x 3.3cm width x 0.1cm depth; 5.961cm^2 area and 0.596cm^3 volume. There is Fat Layer (Subcutaneous Tissue) Exposed exposed. There is no tunneling or undermining noted. There is a small amount of serosanguineous drainage noted. The wound margin is indistinct and nonvisible. There is large (67-100%) red granulation within the wound bed. There is no necrotic tissue within the wound bed. Wound #29 status is Healed - Epithelialized. Original cause of wound was Gradually Appeared. The wound is located on the Right,Posterior Lower Leg. The wound measures 0cm length x 0cm width x 0cm depth; 0cm^2 area and 0cm^3 volume. There is Fat Layer (Subcutaneous Tissue) Exposed exposed. There is no tunneling or undermining noted. There is a small amount of serous drainage noted. The wound margin is flat and intact. There is large (67-100%) pink, pale granulation within the wound bed. There is no necrotic tissue within the wound bed. Wound #31 status is Open. Original cause of wound was Gradually Appeared. The wound is located on the Right,Distal,Posterior Lower Leg. The wound measures 1.2cm length x 0.7cm width x 0.1cm depth; 0.66cm^2 area and 0.066cm^3 volume. There is Fat Layer (Subcutaneous Tissue) Exposed exposed. There is no tunneling or undermining noted. There is a small amount of serosanguineous drainage noted. The wound margin is flat and intact. There is large (67-100%) red granulation within the wound bed. There is no necrotic  tissue within the wound bed. Wound #32 status is Open. Original cause of wound was Gradually Appeared. The wound is located on the Left,Anterior Lower Leg. The wound measures 0.4cm length x 0.5cm width x 0.1cm depth; 0.157cm^2 area and 0.016cm^3 volume. There is Fat Layer (Subcutaneous Tissue) Exposed exposed. There is no tunneling or undermining noted. There is a small amount of serosanguineous drainage noted. The wound margin is flat and intact. There is small (1-33%) pink granulation within the wound bed. There is a large (67-100%) amount of necrotic tissue within the wound bed including Adherent Slough. Wound #33 status is Open. Original cause of wound was Gradually Appeared. The wound is located on the Left,Proximal,Lateral Lower Leg. The wound measures 1.9cm length x 2.4cm width x 0.1cm depth; 3.581cm^2 area and 0.358cm^3 volume. There is Fat Layer (Subcutaneous Tissue) Exposed exposed. There is no tunneling or undermining noted. There is a small amount of serosanguineous drainage noted. The wound margin is flat and intact. There is medium (34-66%) pink granulation within the wound bed. There is a medium (34-66%) amount of necrotic tissue within the wound bed including Adherent Slough. Wound #34 status is Healed - Epithelialized. Original cause of wound was Gradually Appeared. The wound is located on the Left,Distal,Lateral Lower Leg. The wound measures 0cm length x 0cm width x 0cm depth; 0cm^2 area and 0cm^3 volume. There is no tunneling or undermining noted. There is a none present amount of drainage noted. The wound margin is indistinct and nonvisible. There is no granulation within the wound bed. There is no necrotic tissue within the wound bed. Wound #36 status is Healed - Epithelialized. Original cause of wound was Gradually Appeared.  The wound is located on the Left,Posterior Lower Leg. The wound measures 0cm length x 0cm width x 0cm depth; 0cm^2 area and 0cm^3 volume. There is Fat  Layer (Subcutaneous Tissue) Exposed exposed. There is no tunneling or undermining noted. There is a small amount of serous drainage noted. The wound margin is flat and intact. There is large (67-100%) pink, pale granulation within the wound bed. There is no necrotic tissue within the wound bed. 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 There was a Four Layer Compression Therapy Procedure by Carlene Coria, RN. Post procedure Diagnosis Wound #28: Same as Pre-Procedure Wound #31 Pre-procedure diagnosis of Wound #31 is a Lymphedema located on the Right,Distal,Posterior Lower Leg . There was a Four Layer Compression Therapy Procedure by Carlene Coria, RN. Post procedure Diagnosis Wound #31: Same as Pre-Procedure Plan Follow-up Appointments: Return Appointment in 2 weeks. Nurse Visit: - 1 week Skin Barriers/Peri-Wound Care: Barrier cream Moisturizing lotion TCA Cream or Ointment Wound Cleansing: May shower and wash wound with soap and water. Primary Wound Dressing: Wound #28 Right,Lateral Lower Leg: Hydrofera Blue - ready Wound #29 Right,Posterior Lower Leg: Hydrofera Blue - ready Wound #31 Right,Distal,Posterior Lower Leg: Hydrofera Blue - ready Wound #32 Left,Anterior Lower Leg: Hydrofera Blue - ready Wound #33 Left,Proximal,Lateral Lower Leg: Hydrofera Blue - ready Wound #34 Left,Distal,Lateral Lower Leg: Hydrofera Blue - ready Secondary Dressing: Wound #28 Right,Lateral Lower Leg: ABD pad Wound #29 Right,Posterior Lower Leg: ABD pad Wound #31 Right,Distal,Posterior Lower Leg: ABD pad Wound #32 Left,Anterior Lower Leg: ABD pad Wound #33  Left,Proximal,Lateral Lower Leg: ABD pad Wound #34 Left,Distal,Lateral Lower Leg: ABD pad Edema Control: 4 layer compression - Right Lower Extremity - unna boot at top to help secure wrap Other: - left leg: PT to apply lymphedema wraps 1. Continue with Hydrofera Blue to all wound areas. We are making quite significant progress 2. Her edema control is as good on both sides as I have seen it. The lymphedema specialist is working on her left we are working on her right. We are applying 4-layer compression on the right Electronic Signature(s) Signed: 03/07/2019 6:45:39 PM By: Linton Ham MD Entered By: Linton Ham on 03/07/2019 14:56:01 -------------------------------------------------------------------------------- SuperBill Details Patient Name: Date of Service: [name] [name], [name] [name] 03/07/2019 Medical Record [id number] Patient Account Number: [id number] Date of Birth/Sex: Treating RN: 08/28/1945 (73 y.o. [name] Primary Care Provider: Pricilla [name] Other Clinician: Referring Provider: Treating Provider/Extender:, [name] [name] in Treatment: 8 Diagnosis Coding ICD-10 Codes Code Description I89.0 Lymphedema, not elsewhere classified L97.221 Non-pressure chronic ulcer of left calf limited to breakdown of skin L97.211 Non-pressure chronic ulcer of right calf limited to breakdown of skin E11.622 Type 2 diabetes mellitus with other skin ulcer Facility Procedures CPT4 Code Description: [id number] (Facility Use Only) [phone] - APPLY MULTLAY COMPRS LWR RT LEG Modifier: Quantity: 1 Physician Procedures CPT4 Code Description: [id number] - WC PHYS LEVEL 3 - EST PT ICD-10 Diagnosis Description L97.221 Non-pressure chronic ulcer of left calf limited to bre L97.211 Non-pressure chronic ulcer of right calf limited to br I89.0 Lymphedema, not elsewhere  classified Modifier: akdown of skin eakdown of skin Quantity: 1 Electronic  Signature(s) Signed: 03/07/2019 6:45:39 PM By: Linton Ham MD Entered By:  Linton Ham on 03/07/2019 14:56:23

## 2019-03-14 NOTE — Progress Notes (Signed)
[name] [name], [name] [name] ([id number]) Visit Report for 02/14/2019 HPI Details Patient Name: Date of Service: [name] [name], [name] [name] 02/14/2019 8:15 AM Medical Record [id number] Patient Account Number: [id number] Date of Birth/Sex: Treating RN: 30-Jun-1945 (73 y.o. [name] [name] Primary Care Provider: [name] Other Clinician: Referring Provider: Treating Provider/Extender:Davione Lenker, Tenna Child, [place] Siren in Treatment: 5 History of Present Illness HPI Description: 04/09/15; the patient is here for review of wounds on her bilateral lower extremities for the last month. The patient has a history of lymphedema and bilateral chronic venous insufficiency with inflammation she was here for review of wounds on her bilateral legs in 2014 she was discharged home with external compression pumps which she is currently not using. Over the last month she developed wounds on her bilateral lower legs with drainage and pain and she is here for our review of this. 04/16/15; the patient's edema is under much better control. She has 3 areas one on the right anterior medial leg one on the left posterior leg and a small open area with purulent drainage on the left anterior leg. I have cultured the area on the left anterior leg 04/23/15; continued improvement in the patient's edema. All her wounds of closed I see no open areas. Culture from the left anterior leg last time was negative. READMISSION 02/07/16; this patient is a woman that I saw her earlier in 2017. I believe we discharged her very quickly with bilateral wounds in her legs secondary to chronic venous hypertension with inflammation and secondary lymphedema. She had juxtalite stockings. She has external compression pumps at home from a stay in our clinic in 2014. She tells me that she's had wounds on her bilateral lower legs since March or April of this year. 3 wounds on the left 2 on the right. She is a type II diabetic and has a history of  noncompressible vessels bilaterally although she is tolerated 4-layer compression. The last note from her primary physician Dr. [name] was in May. At that point Dr. [name] had referred the patient here for lymphedema management. The patient stated she made a phone call to year but was not able to arrange an appointment. It is not clear that she is actually using anything on the wounds currently except some gauze that was saturated with drainage per our intake nurse. She is certainly not using her juxtalite stockings and by the patient's own admission she is not using her compression pumps because they are "irritating". I don't see a recent hemoglobin A1c. The patient states she has not been systemically unwell but the legs are painful 02/14/16; patient tolerated her Profore wraps reasonably well. She is using the compression pumps 1 time per day. Measurements of her legs are better in terms of the amount of lymphedema 02/21/16; patient tolerated her Profore wraps it. She says she is using her compression pumps one or 2 times a day. Measurements of her leg circumference are improved and most of her wound dimensions are better, unfortunately the home health that we referred her to did not except her insurance but works successful in getting her accompanied that would except her insurance however she apparently has a $25 co-pay which she cannot afford as she is on a fixed income 03/05/16; patient states she did not back using the pumps. She is healed that 2 wounds on the left leg. She still has areas on the right lateral lower leg, right medial lower leg and left lateral lower leg. All of these look some better and have  reduced in area 03/13/16; still 3 wounds 1 on the left lateral leg, one on the left medial/posterior leg which is really the most extensive and a small area on the right lateral leg. All of this in the setting of severe chronic lymphedema and chronic  venous inflammation. Damage to the skin with cobblestone thickened skin. 03/20/16; still 2 areas. The area on the left lateral leg appears to of closed over. The right medial leg is still most extensive wound although it appears to be closing over right lateral only a small open area remains. Using silver alginate Profore wraps 03/27/16; the area on the left leg remains closed. The edema control is better on the left than the right. The right medial leg is still weeping but everything appears to be epithelialized. The lateral aspect of the right leg appears closed. She tells me she has old juxtalite stockings. She has been compliant with her compression pumps. 04/03/16; the area on the left leg remains closed. She has ordered new juxtalite stockings. The right posterior medial wound is fully epithelialized. However she has an irritated area over the right Achilles that she states is been itching all week under the wraps.. She has been compliant with her compression pumps 04/10/16; the area on the left leg remains closed. The right posterior medial wound is also closed today. The irritated area over the right Achilles which was probably wrap injury is also fully epithelialized and closed. The patient has not received her new juxtalite stockings, she arrives in clinic today with an old juxtalite stocking in place. Sheis using her compression pumps secondary to her severe stage III lymphedema READMISSION 01/21/17; this is a patient we know from several previous stays in this clinic. She has lymphedema with chronic venous insufficiency and inflammation. The last time she was here we discharged her with bilateral juxta lites which she is compliant with. She also has external compression pumps at home from stays in our clinic in 2014 although she tells me that she is not very compliant with these as when she uses them a causes pain in her bilateral knees the patient states that her wounds on her left  greater than right leg including 2 large areas on the left anterior and left lateral leg and an area on the right leg open in late June or July since then she is been followed by [facility] and she's had bilateral Unna boot supplied every week for the last month.. I think the wounds have actually improved although they are not specifically dressed. She states she had an ultrasound to rule out DVT bilaterally that was negative. She does not have arterial studies although she is a diabetic. She states she has a lot of pain in her legs she states she does not use her external compression pumps because it causes pain in her knees. As usual her ABIs were noncompressible bilaterally in this clinic 01/28/17; currently the patient only has one small wound on the left lateral leg and I think this should be healed next week. We are going to try to get her bilateral extremitease stockings. She tells me that compression pumps heard her posterior rib arthritic knees so she is not been compliant with this 02/04/17; the patient's wounds are totally healed. She has chronic venous insufficiency and secondary lymphedema. She has new extremitease stockings. She also has compression pumps Readmission: 04/21/17 on evaluation today patient appears to be doing about the same as what she was previous when we were evaluating  her back in November 2018. At that point she had completely healed. With that being said she has now reopened and states that she wasn't aware that she was supposed to come back. 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 Subsequently she also states that although she has the compression that we got for her previously the extremitease stockings she nonetheless doesn't always wear these and being noncompliant often has things will reopen as far as ulcers on her lower extremities. No fevers,  chills, nausea, or vomiting noted at this time. Patient has no evidence of dementia. She is having some discomfort in regard to the ulcers at this point. 04/28/17 on evaluation today patient appears to be doing significantly better in regard to her wounds. With that being said she is still having significant discomfort and is not really keen on sharp debridement. She does not appear to have any evidence of infection which is good news I do think the Iodoflex is beneficial for her. Of note her ABI's were found in the system to be on the right 1.21 with a TBI of 0.76 and on the left 1.35 with a TBI of 0.75. In general her swelling appears to be significantly improved however. No fevers, chills, nausea, or vomiting noted at this time. Patient has been tolerating the wraps without complication. She has no evidence of dementia. 05/05/17-she is here in follow-up evaluation for bilateral lower extremity venous and lymphedema ulcers. She states she has not been using her lymphedema pumps secondary to pain at her knees. After discussion it was noted that she uses knee-high lymphedema pumps but does have a pair of thigh-high lymphedema pumps. She was strongly advised to use the thigh-high lymphedema pumps and if necessary reduce the setting for improved tolerance. She reports no change in odor and/or drainage. She continues to tolerate sharp debridement poorly with significant pain, primarily to the right lower extremity. We will continue with current treatment plan, along with improved lymphedema pump use and follow-up next week 05/12/17 on evaluation today patient actually appears to be doing much better in regard to her bilateral lower extremity ulcers. Only the right altar actually requires debridement today. The other two cleaned off nicely in two of the three in fact on the left lower extremity or actually almost completely healed. She does have some weeping noted still the bilateral lower extremities left  greater than right. She still is not able to use her compression pumps even though I have mentioned this several times to her as did Leah last week. No fevers, chills, nausea, or vomiting noted at this time. Patient has been tolerating the dressing changes without complication. Patient has no evidence of dementia 05/19/17 on evaluation today patient appears to be doing very well regard to her bilateral lower extremity ulcers. In fact she seems to be improving and in fact healed in a couple of locations that were giving her trouble last week. Overall I'm extremely pleased with how things have progressed. She still has some discomfort especially in the right lateral lower extremity. Fortunately there does not appear to be any evidence of significant infection which is good news she does tell me that she has used her compression pumps several times although not routinely over the past week I did praise her for this. I do think it will be beneficial and that might even be what's helping with her wounds as far  as healing is concerned at least to a degree. No fevers, chills, nausea, or vomiting noted at this time. Patient has no dementia and no evidence of infection. 05/26/17 on evaluation today patient presents for evaluation concerning her bilateral lower extremity ulcer secondary to lymphedema. Fortunately she appears to be doing very well at this point in time. Specifically her right leg appears to be completely closed and healed. The left leg though there is still a small area of opening overall has healed. She does have a little bit of excoriation at the top or the wraps are because they slid down on her. Otherwise there's no evidence of infection and the other has improved dramatically now that she is not draining as significantly. Patient is having no significant pain she tells me she has been able to use her lymphedema pumps one time a day although that's not all she can tolerate. Even that is  difficult for her. 06/02/17 on evaluation today patient's ulcers appear to be completely closed at this point. She does still note some odor but I believe this is due to the fact she has not been able to wash her legs as well currently. Fortunately she did bring her compression with her today. She does have the EXTREMIT-EASE Compression. With that being said she is somewhat nervous about going back to this as previously she broke out yet again once we discontinue wrapping her. READMISSION 11/23/17; patient is now 73 year old diabetic woman with severe chronic venous insufficiency with lymphedema. We care for her last in this clinic in February 2019 at which time she had chronic venous insufficiency with lymphedema bilateral lower extremity ulcers. We discharged her with bilateral wrap around/extremitease stockings and compression pumps. Is fairly clear she is not using the compression pumps stating it causes pain in her knees. In any case she was admitted to hospital from 8/3 through 8/6 with a several week history of recurrent bilateral ulcers. She was felt to have bilateral cellulitis treated with doxycycline IV. She is still on oral doxycycline and has another 2 days worth. At discharge her white count was 14.1 hemoglobin at 8.9. The patient is a diabetic however her last ABIs in 2018 showed a right ABI of 1.21, left of 1.35. TBI's were 0.76 on the right 0.75 on the left. Posterior tibial artery was monophasic on the right triphasic on the left. Dorsalis pedis triphasic on the right and triphasic on the left. She is not felt to have significant macrovascular disease 12/02/17;significant bilateral lower extremity wounds secondary to severe uncontrolled lymphedema. She did not get home health out to change the wraps. So she kept this on all week. She is telling me she using his her compression pumps once a week. In general all the wounds look better. We've been using silver alginate with secondary  absorptive dressings 4 layer compression 12/10/17; bilateral lower extremity wounds secondary to uncontrolled lymphedema. She tells me she is using her compression pumps on most days. We've been keeping wraps on all week. Using silver alginate. She has wounds on the right lateral calf left medial and left lateral calf. All of this is somewhat better 12/16/17; the patient has 3 small open areas. One on the right lateral calf, one on the left medial calf and the small area on the left lateral calf. She has lymphedema, chronic venous insufficiency with hemosiderin deposition. Very fibrotic distal lower extremity scan with suggestion of an inverted bottle sign appearance to her legs. She has compression pumps but she does  not use them has not used them in the last week. Currently we have her in 4 layer compression, silver alginate to the wounds and this is being changed once by home health. She is noncompliant with the compression pumps because she says it hurts her knees. She also has compression stockings ando Juxta light stockings at home 12/24/17; the patient's right lateral calf is healed as well as the left medial. There is no open wound on the right leg. She still has 2 areas on the left lateral calf. We transitioned the right leg into an extremitease stocking which she brought in from home. 12/31/17; the patient has a superficial wound on the left lateral lower calf. We've been wrapping this leg. She arrives today with the right leg swollen but without a wound. She cannot get her extremities stocking on the right leg. There is a lot more edema here. She is not using her compression pumps 01/10/2018; patient has a same superficial wound on the left lateral calf. Her right leg has less peripheral edema. She tells me she also started herself on some Lasix that had been previously prescribed for her at some point but she had never taken it. She is also concerned about whether her compression pumps are  leaking. She has extremitease stockings however the edema gets bad enough she can get these on. Currently she has no open wound on the right leg and is mention the edema is actually better than when I saw this 10 days ago 01/17/2018; patient has a same superficial area on the posterior left calf and a weeping area in the mid part of the right lateral calf. She still does not have anyone from [facility] coming to see her compression pumps which she describes as being suspicious for a leakage. I am not really sure how frequently she is using this in any case. She has massive bilateral calf lymphedema. I think there is some spreading lymphedema into her posterior thighs. 01/24/2018; the patient's edema in her legs is actually some better. She still has a smaller area on the left posterior calf although this is better. The weeping area on the right lateral calf is also better. As it turned out she did not get her pumps from [facility] but [facility] and they are going to try to go out and see these this week which is going to help. I explained to her that she will use the compression pumps once a day while we are wrapping her legs but when she transitions to stockings that may be she will require pumping twice a day. Her compliance in this area has not been high and I wonder about her ability to do this herself. 02/01/2018; the area on the posterior left leg is healed. She still has the original wound on the right lateral calf which is epithelialized. The however just above this there is still weeping lymphedema fluid. She has a new area on the upper right calf which is either wrap injury or is she points out where she has been scratching under the wraps. We still not have managed to get a hold of a representative of [facility] to see if the pump is operational. She has extremitease stockings at home however I am not of the opinion that this will maintain her skin  integrity without the compression pumps. 02/08/2018; posterior left leg remains healed although she has a new small open area on the left lateral calf. Right lateral calf has two quarter  sized open areas and close juxtaposition. There is less weeping edema fluid. She apparently managed to contact [facility]. They are sending her a part. But she is still not received it 02/15/2018; the patient has no open wound on either leg. Some scale present on the right upper lateral calf just below the fibular head. Edema control is excellent. She did not bring her stockings or her juxta lite wrap around. She has the part for her compression pump but she has not use the compression pumps yet READMISSION 01/10/2019 Patient is now a 73 year old woman that we have had in the clinic multiple times in the past. She has chronic lower extremity lymphedema, recurrent wounds on her bilateral lower extremities. She is also a type II diabetic reasonably well controlled with a recent hemoglobin A1c while she was in the hospital of 5.9. She has wounds on her bilateral lower extremities mostly on the left lateral and right lateral. These were noted also during her recent hospitalization. The patient states they have been there for a while. She has not been able to wear her stockings he has trouble getting them on as she lives alone. She also has external compression pumps but uses them sparingly because it causes pain in her knees which hurt because of osteoarthritis. The patient was on doxycycline before she went into the hospital apparently for fear of infection in her legs and is on Keflex coming out because of a UTI The patient was recently in the hospital from 01/06/2019 through 01/09/2019. She was admitted with nausea and vomiting and prerenal azotemia. This was corrected with IV fluid her Lasix has been put on hold. Hemoglobin A1c at 5.9 her metformin was discontinued. She was also felt to have a UTI she was  prescribed Keflex at 503 times a day she is picking that up today. The patient is not felt to have an arterial issue. ABIs in our clinic were 1.24 on the right and 1.06 on the left. She had formal studies in 2018 at which time her ABIs were 1.21 on the right TBI of 0.76 and on the left 1.35 and 0.75 respectively. Waveforms are on the right were monophasic and biphasic, triphasic and biphasic on the left. She is tolerated 4 layer compression in the past 10/13. Comes in today having not used her compression pumps /perhaps once in the last week. She has too much edema to consider healing these wounds. We have been using silver alginate 10/20; she tells me he had she used her compression pumps once a day as we asked. Clearly she has edema out of her legs although most of it seems to be settling around her knees which is what she complains about. We have been using silver alginate to the extensive de-epithelialized area on the left lateral calf and a much smaller area on the right lateral extending linearly into the right posterior 10/27; she is using her compression pumps daily. Her edema control is better. We have been using silver alginate to the extensive wound areas on her bilateral lower legs. This includes left anterior left lateral and left posterior. Right anterior right lateral and right posterior. The wounds are not all in the same condition. She has severe bilateral skin damage with the epithelialization, flaking dried epithelium 11/3; she is using her compression pumps 1 times daily. Pretty obvious that her wraps fell down especially on the left leg to mid calf. I think things are improving on the right lateral calf, I do not see  anything open posteriorly. The major areas on the left lateral where she has 3 or 4 deeper wounds in the midst of an entire de-epithelialized area. There is erythema here but no tenderness I think this represents venous stasis rather than cellulitis. I debrided  the areas on the left lateral calf last week but once again they have tightly adherent debris over the top of them which is disappointing 11/10; the patient's major areas on the left lateral calf to 3 wounds with some depth surrounded by a large area of de-epithelialized tissue. There is no infection. We have been using silver alginate under 4-layer compression. On the right lateral she has a more contained wound area that is superficial. Been using silver alginate under 4-layer compression Electronic Signature(s) Signed: 02/14/2019 5:17:49 PM By: Linton Ham MD Entered By: Linton Ham on 02/14/2019 09:03:51 -------------------------------------------------------------------------------- Physical Exam Details Patient Name: Date of Service: [name] [name], [name] [name] 02/14/2019 8:15 AM Medical Record [id number] Patient Account Number: [id number] Date of Birth/Sex: Treating RN: [birth year] (73 y.o. [name] [name] Primary Care Provider: [name] Other Clinician: Referring Provider: Treating Provider/Extender:Emorie Mcfate, Tenna Child, [place] Siren in Treatment: 5 Constitutional Patient is hypertensive.. Pulse regular and within target range for patient.Marland Kitchen Respirations regular, non-labored and within target range.. Temperature is normal and within the target range for the patient.Marland Kitchen Appears in no distress. Eyes Conjunctivae clear. No discharge.no icterus. Respiratory work of breathing is normal. Bilateral breath sounds are clear and equal in all lobes with no wheezes, rales or rhonchi.. Cardiovascular Heart rhythm and rate regular, without murmur or gallop. I see no systemic evidence of CHF. Pedal pulses palpable and strong bilaterally.. Severe bilateral nonpitting edema with hemosiderin deposition. Compatible with severe venous insufficiency and lymphedema we have poor edema control. Integumentary (Hair, Skin) Changes of chronic venous insufficiency with hemosiderin  deposition. Nonpitting edema compatible with lymphedema. Psychiatric appears at normal baseline. Notes Wound exam; 3 open areas on the left lateral calf with surrounding skin that is de-epithelialized. This is absolutely not improved. The edema in the left leg is also not improved On the right lateral calf again edema is not can prove the wound may be slightly smaller Electronic Signature(s) Signed: 02/14/2019 5:17:49 PM By: Linton Ham MD Entered By: Linton Ham on 02/14/2019 09:05:51 -------------------------------------------------------------------------------- Physician Orders Details Patient Name: Date of Service: [name] [name], [name] [name] 02/14/2019 8:15 AM Medical Record [id number] Patient Account Number: [id number] Date of Birth/Sex: Treating RN: 10/14/1945 (73 y.o. [name] [name] Primary Care Provider: [name] Other Clinician: Referring Provider: Treating Provider/Extender:Amera Banos, Tenna Child, [place] Siren in Treatment: 5 Verbal / Phone Orders: No Diagnosis Coding ICD-10 Coding Code Description I89.0 Lymphedema, not elsewhere classified L97.221 Non-pressure chronic ulcer of left calf limited to breakdown of skin L97.211 Non-pressure chronic ulcer of right calf limited to breakdown of skin E11.622 Type 2 diabetes mellitus with other skin ulcer Follow-up Appointments Return Appointment in 1 week. Skin Barriers/Peri-Wound Care Barrier cream Moisturizing lotion TCA Cream or Ointment Wound Cleansing May shower and wash wound with soap and water. Primary Wound Dressing Wound #28 Right,Lateral Lower Leg Hydrofera Blue - ready Wound #29 Right,Posterior Lower Leg Hydrofera Blue - ready Wound #31 Right,Distal,Posterior Lower Leg Hydrofera Blue - ready Wound #32 Left,Anterior Lower Leg Hydrofera Blue - ready Wound #33 Left,Proximal,Lateral Lower Leg Hydrofera Blue - ready Wound #34 Left,Distal,Lateral Lower Leg Hydrofera Blue -  ready Secondary Dressing Wound #28 Right,Lateral Lower Leg ABD pad Wound #29 Right,Posterior Lower Leg ABD pad Wound #31 Right,Distal,Posterior Lower Leg ABD pad Wound #32 Left,Anterior Lower  Leg ABD pad Wound #33 Left,Proximal,Lateral Lower Leg ABD pad Wound #34 Left,Distal,Lateral Lower Leg ABD pad Edema Control 4 layer compression - Bilateral - unna boot at top to help secure wrap Electronic Signature(s) Signed: 02/14/2019 5:17:49 PM By: Linton Ham MD Signed: 03/14/2019 2:53:59 PM By: Carlene Coria RN Entered By: Carlene Coria on 02/14/2019 07:56:58 -------------------------------------------------------------------------------- Problem List Details Patient Name: Date of Service: [name] [name], [name] [name] 02/14/2019 8:15 AM Medical Record [id number] Patient Account Number: [id number] Date of Birth/Sex: Treating RN: Jun 29, 1945 (73 y.o. [place] Lo, [place] Primary Care Provider: [name] Other Clinician: Referring Provider: Treating Provider/Extender:Sharika Mosquera, Tenna Child, [place] Siren in Treatment: 5 Active Problems ICD-10 Evaluated Encounter Code Description Active Date Today Diagnosis I89.0 Lymphedema, not elsewhere classified 01/10/2019 No Yes L97.221 Non-pressure chronic ulcer of left calf limited to 01/10/2019 No Yes breakdown of skin L97.211 Non-pressure chronic ulcer of right calf limited to 01/10/2019 No Yes breakdown of skin E11.622 Type 2 diabetes mellitus with other skin ulcer 01/10/2019 No Yes Inactive Problems Resolved Problems Electronic Signature(s) Signed: 02/14/2019 5:17:49 PM By: Linton Ham MD Entered By: Linton Ham on 02/14/2019 09:01:58 -------------------------------------------------------------------------------- Progress Note Details Patient Name: Date of Service: [name] [name], [name] [name] 02/14/2019 8:15 AM Medical Record [id number] Patient Account Number: [id number] Date of Birth/Sex: Treating RN: Jun 19, 1945 (73 y.o. [name] [name] Primary Care Provider: [name] Other Clinician: Referring Provider: Treating Provider/Extender:Mahdiya Mossberg, Tenna Child, [place] Siren in Treatment: 5 Subjective History of Present Illness (HPI) 04/09/15; the patient is here for review of wounds on her bilateral lower extremities for the last month. The patient has a history of lymphedema and bilateral chronic venous insufficiency with inflammation she was here for review of wounds on her bilateral legs in 2014 she was discharged home with external compression pumps which she is currently not using. Over the last month she developed wounds on her bilateral lower legs with drainage and pain and she is here for our review of this. 04/16/15; the patient's edema is under much better control. She has 3 areas one on the right anterior medial leg one on the left posterior leg and a small open area with purulent drainage on the left anterior leg. I have cultured the area on the left anterior leg 04/23/15; continued improvement in the patient's edema. All her wounds of closed I see no open areas. Culture from the left anterior leg last time was negative. READMISSION 02/07/16; this patient is a woman that I saw her earlier in 2017. I believe we discharged her very quickly with bilateral wounds in her legs secondary to chronic venous hypertension with inflammation and secondary lymphedema. She had juxtalite stockings. She has external compression pumps at home from a stay in our clinic in 2014. She tells me that she's had wounds on her bilateral lower legs since March or April of this year. 3 wounds on the left 2 on the right. She is a type II diabetic and has a history of noncompressible vessels bilaterally although she is tolerated 4-layer compression. The last note from her primary physician Dr. [name] was in May. At that point Dr. [name] had referred the patient here for lymphedema management. The patient stated  she made a phone call to year but was not able to arrange an appointment. It is not clear that she is actually using anything on the wounds currently except some gauze that was saturated with drainage per our intake nurse. She is certainly not using her juxtalite stockings and by the patient's own admission she is not using  her compression pumps because they are "irritating". I don't see a recent hemoglobin A1c. The patient states she has not been systemically unwell but the legs are painful 02/14/16; patient tolerated her Profore wraps reasonably well. She is using the compression pumps 1 time per day. Measurements of her legs are better in terms of the amount of lymphedema 02/21/16; patient tolerated her Profore wraps it. She says she is using her compression pumps one or 2 times a day. Measurements of her leg circumference are improved and most of her wound dimensions are better, unfortunately the home health that we referred her to did not except her insurance but works successful in getting her accompanied that would except her insurance however she apparently has a $25 co-pay which she cannot afford as she is on a fixed income 03/05/16; patient states she did not back using the pumps. She is healed that 2 wounds on the left leg. She still has areas on the right lateral lower leg, right medial lower leg and left lateral lower leg. All of these look some better and have reduced in area 03/13/16; still 3 wounds 1 on the left lateral leg, one on the left medial/posterior leg which is really the most extensive and a small area on the right lateral leg. All of this in the setting of severe chronic lymphedema and chronic venous inflammation. Damage to the skin with cobblestone thickened skin. 03/20/16; still 2 areas. The area on the left lateral leg appears to of closed over. The right medial leg is still most extensive wound although it appears to be closing over right lateral only a small open  area remains. Using silver alginate Profore wraps 03/27/16; the area on the left leg remains closed. The edema control is better on the left than the right. The right medial leg is still weeping but everything appears to be epithelialized. The lateral aspect of the right leg appears closed. She tells me she has old juxtalite stockings. She has been compliant with her compression pumps. 04/03/16; the area on the left leg remains closed. She has ordered new juxtalite stockings. The right posterior medial wound is fully epithelialized. However she has an irritated area over the right Achilles that she states is been itching all week under the wraps.. She has been compliant with her compression pumps 04/10/16; the area on the left leg remains closed. The right posterior medial wound is also closed today. The irritated area over the right Achilles which was probably wrap injury is also fully epithelialized and closed. The patient has not received her new juxtalite stockings, she arrives in clinic today with an old juxtalite stocking in place. Sheis using her compression pumps secondary to her severe stage III lymphedema READMISSION 01/21/17; this is a patient we know from several previous stays in this clinic. She has lymphedema with chronic venous insufficiency and inflammation. The last time she was here we discharged her with bilateral juxta lites which she is compliant with. She also has external compression pumps at home from stays in our clinic in 2014 although she tells me that she is not very compliant with these as when she uses them a causes pain in her bilateral knees the patient states that her wounds on her left greater than right leg including 2 large areas on the left anterior and left lateral leg and an area on the right leg open in late June or July since then she is been followed by [facility] and she's had bilateral Louretta Par[place]  boot supplied every week for the last month.. I think the  wounds have actually improved although they are not specifically dressed. She states she had an ultrasound to rule out DVT bilaterally that was negative. She does not have arterial studies although she is a diabetic. She states she has a lot of pain in her legs she states she does not use her external compression pumps because it causes pain in her knees. As usual her ABIs were noncompressible bilaterally in this clinic 01/28/17; currently the patient only has one small wound on the left lateral leg and I think this should be healed next week. We are going to try to get her bilateral extremitease stockings. She tells me that compression pumps heard her posterior rib arthritic knees so she is not been compliant with this 02/04/17; the patient's wounds are totally healed. She has chronic venous insufficiency and secondary lymphedema. She has new extremitease stockings. She also has compression pumps Readmission: 04/21/17 on evaluation today patient appears to be doing about the same as what she was previous when we were evaluating her back in November 2018. At that point she had completely healed. With that being said she has now reopened and states that she wasn't aware that she was supposed to come back. 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 Subsequently she also states that although she has the compression that we got for her previously the extremitease stockings she nonetheless doesn't always wear these and being noncompliant often has things will reopen as far as ulcers on her lower extremities. No fevers, chills, nausea, or vomiting noted at this time. Patient has no evidence of dementia. She is having some discomfort in regard to the ulcers at this point. 04/28/17 on evaluation today patient appears to be doing significantly better in regard to her wounds. With that being said she  is still having significant discomfort and is not really keen on sharp debridement. She does not appear to have any evidence of infection which is good news I do think the Iodoflex is beneficial for her. Of note her ABI's were found in the system to be on the right 1.21 with a TBI of 0.76 and on the left 1.35 with a TBI of 0.75. In general her swelling appears to be significantly improved however. No fevers, chills, nausea, or vomiting noted at this time. Patient has been tolerating the wraps without complication. She has no evidence of dementia. 05/05/17-she is here in follow-up evaluation for bilateral lower extremity venous and lymphedema ulcers. She states she has not been using her lymphedema pumps secondary to pain at her knees. After discussion it was noted that she uses knee-high lymphedema pumps but does have a pair of thigh-high lymphedema pumps. She was strongly advised to use the thigh-high lymphedema pumps and if necessary reduce the setting for improved tolerance. She reports no change in odor and/or drainage. She continues to tolerate sharp debridement poorly with significant pain, primarily to the right lower extremity. We will continue with current treatment plan, along with improved lymphedema pump use and follow-up next week 05/12/17 on evaluation today patient actually appears to be doing much better in regard to her bilateral lower extremity ulcers. Only the right altar actually requires debridement today. The other two cleaned off nicely in two of the three in fact on the left lower extremity or actually almost completely healed. She does have  some weeping noted still the bilateral lower extremities left greater than right. She still is not able to use her compression pumps even though I have mentioned this several times to her as did Leah last week. No fevers, chills, nausea, or vomiting noted at this time. Patient has been tolerating the dressing changes without complication.  Patient has no evidence of dementia 05/19/17 on evaluation today patient appears to be doing very well regard to her bilateral lower extremity ulcers. In fact she seems to be improving and in fact healed in a couple of locations that were giving her trouble last week. Overall I'm extremely pleased with how things have progressed. She still has some discomfort especially in the right lateral lower extremity. Fortunately there does not appear to be any evidence of significant infection which is good news she does tell me that she has used her compression pumps several times although not routinely over the past week I did praise her for this. I do think it will be beneficial and that might even be what's helping with her wounds as far as healing is concerned at least to a degree. No fevers, chills, nausea, or vomiting noted at this time. Patient has no dementia and no evidence of infection. 05/26/17 on evaluation today patient presents for evaluation concerning her bilateral lower extremity ulcer secondary to lymphedema. Fortunately she appears to be doing very well at this point in time. Specifically her right leg appears to be completely closed and healed. The left leg though there is still a small area of opening overall has healed. She does have a little bit of excoriation at the top or the wraps are because they slid down on her. Otherwise there's no evidence of infection and the other has improved dramatically now that she is not draining as significantly. Patient is having no significant pain she tells me she has been able to use her lymphedema pumps one time a day although that's not all she can tolerate. Even that is difficult for her. 06/02/17 on evaluation today patient's ulcers appear to be completely closed at this point. She does still note some odor but I believe this is due to the fact she has not been able to wash her legs as well currently. Fortunately she did bring her compression with  her today. She does have the EXTREMIT-EASE Compression. With that being said she is somewhat nervous about going back to this as previously she broke out yet again once we discontinue wrapping her. READMISSION 11/23/17; patient is now 73 year old diabetic woman with severe chronic venous insufficiency with lymphedema. We care for her last in this clinic in February 2019 at which time she had chronic venous insufficiency with lymphedema bilateral lower extremity ulcers. We discharged her with bilateral wrap around/extremitease stockings and compression pumps. Is fairly clear she is not using the compression pumps stating it causes pain in her knees. In any case she was admitted to hospital from 8/3 through 8/6 with a several week history of recurrent bilateral ulcers. She was felt to have bilateral cellulitis treated with doxycycline IV. She is still on oral doxycycline and has another 2 days worth. At discharge her white count was 14.1 hemoglobin at 8.9. The patient is a diabetic however her last ABIs in 2018 showed a right ABI of 1.21, left of 1.35. TBI's were 0.76 on the right 0.75 on the left. Posterior tibial artery was monophasic on the right triphasic on the left. Dorsalis pedis triphasic on the right and triphasic  on the left. She is not felt to have significant macrovascular disease 12/02/17;significant bilateral lower extremity wounds secondary to severe uncontrolled lymphedema. She did not get home health out to change the wraps. So she kept this on all week. She is telling me she using his her compression pumps once a week. In general all the wounds look better. We've been using silver alginate with secondary absorptive dressings 4 layer compression 12/10/17; bilateral lower extremity wounds secondary to uncontrolled lymphedema. She tells me she is using her compression pumps on most days. We've been keeping wraps on all week. Using silver alginate. She has wounds on the right lateral calf  left medial and left lateral calf. All of this is somewhat better 12/16/17; the patient has 3 small open areas. One on the right lateral calf, one on the left medial calf and the small area on the left lateral calf. She has lymphedema, chronic venous insufficiency with hemosiderin deposition. Very fibrotic distal lower extremity scan with suggestion of an inverted bottle sign appearance to her legs. She has compression pumps but she does not use them has not used them in the last week. Currently we have her in 4 layer compression, silver alginate to the wounds and this is being changed once by home health. She is noncompliant with the compression pumps because she says it hurts her knees. She also has compression stockings ando Juxta light stockings at home 12/24/17; the patient's right lateral calf is healed as well as the left medial. There is no open wound on the right leg. She still has 2 areas on the left lateral calf. We transitioned the right leg into an extremitease stocking which she brought in from home. 12/31/17; the patient has a superficial wound on the left lateral lower calf. We've been wrapping this leg. She arrives today with the right leg swollen but without a wound. She cannot get her extremities stocking on the right leg. There is a lot more edema here. She is not using her compression pumps 01/10/2018; patient has a same superficial wound on the left lateral calf. Her right leg has less peripheral edema. She tells me she also started herself on some Lasix that had been previously prescribed for her at some point but she had never taken it. She is also concerned about whether her compression pumps are leaking. She has extremitease stockings however the edema gets bad enough she can get these on. Currently she has no open wound on the right leg and is mention the edema is actually better than when I saw this 10 days ago 01/17/2018; patient has a same superficial area on the  posterior left calf and a weeping area in the mid part of the right lateral calf. She still does not have anyone from [facility] coming to see her compression pumps which she describes as being suspicious for a leakage. I am not really sure how frequently she is using this in any case. She has massive bilateral calf lymphedema. I think there is some spreading lymphedema into her posterior thighs. 01/24/2018; the patient's edema in her legs is actually some better. She still has a smaller area on the left posterior calf although this is better. The weeping area on the right lateral calf is also better. As it turned out she did not get her pumps from [facility] but [facility] and they are going to try to go out and see these this week which is going to help. I explained to her  that she will use the compression pumps once a day while we are wrapping her legs but when she transitions to stockings that may be she will require pumping twice a day. Her compliance in this area has not been high and I wonder about her ability to do this herself. 02/01/2018; the area on the posterior left leg is healed. She still has the original wound on the right lateral calf which is epithelialized. The however just above this there is still weeping lymphedema fluid. She has a new area on the upper right calf which is either wrap injury or is she points out where she has been scratching under the wraps. We still not have managed to get a hold of a representative of [facility] to see if the pump is operational. She has extremitease stockings at home however I am not of the opinion that this will maintain her skin integrity without the compression pumps. 02/08/2018; posterior left leg remains healed although she has a new small open area on the left lateral calf. Right lateral calf has two quarter sized open areas and close juxtaposition. There is less weeping edema fluid. She apparently  managed to contact [facility]. They are sending her a part. But she is still not received it 02/15/2018; the patient has no open wound on either leg. Some scale present on the right upper lateral calf just below the fibular head. Edema control is excellent. She did not bring her stockings or her juxta lite wrap around. She has the part for her compression pump but she has not use the compression pumps yet READMISSION 01/10/2019 Patient is now a 73 year old woman that we have had in the clinic multiple times in the past. She has chronic lower extremity lymphedema, recurrent wounds on her bilateral lower extremities. She is also a type II diabetic reasonably well controlled with a recent hemoglobin A1c while she was in the hospital of 5.9. She has wounds on her bilateral lower extremities mostly on the left lateral and right lateral. These were noted also during her recent hospitalization. The patient states they have been there for a while. She has not been able to wear her stockings he has trouble getting them on as she lives alone. She also has external compression pumps but uses them sparingly because it causes pain in her knees which hurt because of osteoarthritis. The patient was on doxycycline before she went into the hospital apparently for fear of infection in her legs and is on Keflex coming out because of a UTI The patient was recently in the hospital from 01/06/2019 through 01/09/2019. She was admitted with nausea and vomiting and prerenal azotemia. This was corrected with IV fluid her Lasix has been put on hold. Hemoglobin A1c at 5.9 her metformin was discontinued. She was also felt to have a UTI she was prescribed Keflex at 503 times a day she is picking that up today. The patient is not felt to have an arterial issue. ABIs in our clinic were 1.24 on the right and 1.06 on the left. She had formal studies in 2018 at which time her ABIs were 1.21 on the right TBI of 0.76 and on the  left 1.35 and 0.75 respectively. Waveforms are on the right were monophasic and biphasic, triphasic and biphasic on the left. She is tolerated 4 layer compression in the past 10/13. Comes in today having not used her compression pumps /perhaps once in the last week. She has too much edema to consider healing  these wounds. We have been using silver alginate 10/20; she tells me he had she used her compression pumps once a day as we asked. Clearly she has edema out of her legs although most of it seems to be settling around her knees which is what she complains about. We have been using silver alginate to the extensive de-epithelialized area on the left lateral calf and a much smaller area on the right lateral extending linearly into the right posterior 10/27; she is using her compression pumps daily. Her edema control is better. We have been using silver alginate to the extensive wound areas on her bilateral lower legs. This includes left anterior left lateral and left posterior. Right anterior right lateral and right posterior. The wounds are not all in the same condition. She has severe bilateral skin damage with the epithelialization, flaking dried epithelium 11/3; she is using her compression pumps 1 times daily. Pretty obvious that her wraps fell down especially on the left leg to mid calf. I think things are improving on the right lateral calf, I do not see anything open posteriorly. The major areas on the left lateral where she has 3 or 4 deeper wounds in the midst of an entire de-epithelialized area. There is erythema here but no tenderness I think this represents venous stasis rather than cellulitis. I debrided the areas on the left lateral calf last week but once again they have tightly adherent debris over the top of them which is disappointing 11/10; the patient's major areas on the left lateral calf to 3 wounds with some depth surrounded by a large area of de-epithelialized tissue.  There is no infection. We have been using silver alginate under 4-layer compression. On the right lateral she has a more contained wound area that is superficial. Been using silver alginate under 4-layer compression Objective Constitutional Patient is hypertensive.. Pulse regular and within target range for patient.Marland Kitchen Respirations regular, non-labored and within target range.. Temperature is normal and within the target range for the patient.Marland Kitchen Appears in no distress. Vitals Time Taken: 8:10 AM, Height: 71 in, Weight: 240 lbs, BMI: 33.5, Temperature: 97.9 F, Pulse: 75 bpm, Respiratory Rate: 18 breaths/min, Blood Pressure: 168/62 mmHg. Eyes Conjunctivae clear. No discharge.no icterus. Respiratory work of breathing is normal. Bilateral breath sounds are clear and equal in all lobes with no wheezes, rales or rhonchi.. Cardiovascular Heart rhythm and rate regular, without murmur or gallop. I see no systemic evidence of CHF. Pedal pulses palpable and strong bilaterally.. Severe bilateral nonpitting edema with hemosiderin deposition. Compatible with severe venous insufficiency and lymphedema we have poor edema control. Psychiatric appears at normal baseline. General Notes: Wound exam; 3 open areas on the left lateral calf with surrounding skin that is de-epithelialized. This is absolutely not improved. The edema in the left leg is also not improved ooOn the right lateral calf again edema is not can prove the wound may be slightly smaller Integumentary (Hair, Skin) Changes of chronic venous insufficiency with hemosiderin deposition. Nonpitting edema compatible with lymphedema. Wound #28 status is Open. Original cause of wound was Gradually Appeared. The wound is located on the Right,Lateral Lower Leg. The wound measures 5cm length x 5cm width x 0.1cm depth; 19.635cm^2 area and 1.963cm^3 volume. There is Fat Layer (Subcutaneous Tissue) Exposed exposed. There is no tunneling or undermining  noted. There is a medium amount of purulent drainage noted. The wound margin is flat and intact. There is large (67-100%) pink granulation within the wound bed. There is a small (  1-33%) amount of necrotic tissue within the wound bed including Adherent Slough. Wound #29 status is Open. Original cause of wound was Gradually Appeared. The wound is located on the Right,Posterior Lower Leg. The wound measures 0cm length x 0cm width x 0cm depth; 0cm^2 area and 0cm^3 volume. There is no tunneling or undermining noted. There is a none present amount of drainage noted. The wound margin is flat and intact. There is no granulation within the wound bed. There is no necrotic tissue within the wound bed. Wound #31 status is Open. Original cause of wound was Gradually Appeared. The wound is located on the Right,Distal,Posterior Lower Leg. The wound measures 3cm length x 2cm width x 0.1cm depth; 4.712cm^2 area and 0.471cm^3 volume. There is Fat Layer (Subcutaneous Tissue) Exposed exposed. There is no tunneling or undermining noted. There is a medium amount of purulent drainage noted. The wound margin is flat and intact. There is medium (34-66%) red granulation within the wound bed. There is a medium (34-66%) amount of necrotic tissue within the wound bed including Adherent Slough. Wound #32 status is Open. Original cause of wound was Gradually Appeared. The wound is located on the Left,Anterior Lower Leg. The wound measures 0cm length x 0cm width x 0cm depth; 0cm^2 area and 0cm^3 volume. There is no tunneling or undermining noted. There is a none present amount of drainage noted. The wound margin is flat and intact. There is no granulation within the wound bed. There is no necrotic tissue within the wound bed. Wound #33 status is Open. Original cause of wound was Gradually Appeared. The wound is located on the Left,Proximal,Lateral Lower Leg. The wound measures 2.5cm length x 3.3cm width x 0.1cm depth; 6.48cm^2  area and 0.648cm^3 volume. There is Fat Layer (Subcutaneous Tissue) Exposed exposed. There is no tunneling or undermining noted. There is a medium amount of purulent drainage noted. The wound margin is flat and intact. There is small (1-33%) red, pink granulation within the wound bed. There is a large (67-100%) amount of necrotic tissue within the wound bed including Adherent Slough. Wound #34 status is Open. Original cause of wound was Gradually Appeared. The wound is located on the Left,Distal,Lateral Lower Leg. The wound measures 1.4cm length x 1.5cm width x 0.1cm depth; 1.649cm^2 area and 0.165cm^3 volume. There is Fat Layer (Subcutaneous Tissue) Exposed exposed. There is no tunneling or undermining noted. There is a medium amount of purulent drainage noted. The wound margin is flat and intact. There is small (1-33%) red granulation within the wound bed. There is a large (67-100%) amount of necrotic tissue within the wound bed including Adherent Slough. Wound #36 status is Open. Original cause of wound was Gradually Appeared. The wound is located on the Left,Posterior Lower Leg. The wound measures 2cm length x 3cm width x 0.1cm depth; 4.712cm^2 area and 0.471cm^3 volume. There is Fat Layer (Subcutaneous Tissue) Exposed exposed. There is no tunneling or undermining noted. There is a large amount of purulent drainage noted. The wound margin is distinct with the outline attached to the wound base. There is small (1-33%) pink, pale granulation within the wound bed. There is a large (67-100%) amount of necrotic tissue within the wound bed including Adherent Slough. Assessment Active Problems ICD-10 Lymphedema, not elsewhere classified Non-pressure chronic ulcer of left calf limited to breakdown of skin Non-pressure chronic ulcer of right calf limited to breakdown of skin Type 2 diabetes mellitus with other skin ulcer Procedures Wound #28 Pre-procedure diagnosis of Wound #28 is a Lymphedema  located on the Right,Lateral Lower Leg . There was a Four Layer Compression Therapy Procedure by Carlene Coria, RN. Post procedure Diagnosis Wound #28: Same as Pre-Procedure Wound #29 Pre-procedure diagnosis of Wound #29 is a Lymphedema located on the Right,Posterior Lower Leg . There was a Four Layer Compression Therapy Procedure by Carlene Coria, RN. Post procedure Diagnosis Wound #29: Same as Pre-Procedure Wound #31 Pre-procedure diagnosis of Wound #31 is a Lymphedema located on the Right,Distal,Posterior Lower Leg . There was a Four Layer Compression Therapy Procedure by Carlene Coria, RN. Post procedure Diagnosis Wound #31: Same as Pre-Procedure Wound #32 Pre-procedure diagnosis of Wound #32 is a Lymphedema located on the Left,Anterior Lower Leg . There was a Four Layer Compression Therapy Procedure by Carlene Coria, RN. Post procedure Diagnosis Wound #32: Same as Pre-Procedure Wound #33 Pre-procedure diagnosis of Wound #33 is a Lymphedema located on the Left,Proximal,Lateral Lower Leg . There was a Four Layer Compression Therapy Procedure by Carlene Coria, RN. Post procedure Diagnosis Wound #33: Same as Pre-Procedure Wound #34 Pre-procedure diagnosis of Wound #34 is a Lymphedema located on the Left,Distal,Lateral Lower Leg . There was a Four Layer Compression Therapy Procedure by Carlene Coria, RN. Post procedure Diagnosis Wound #34: Same as Pre-Procedure Wound #36 Pre-procedure diagnosis of Wound #36 is a Lymphedema located on the Left,Posterior Lower Leg . There was a Four Layer Compression Therapy Procedure by Carlene Coria, RN. Post procedure Diagnosis Wound #36: Same as Pre-Procedure Plan Follow-up Appointments: Return Appointment in 1 week. Skin Barriers/Peri-Wound Care: Barrier cream Moisturizing lotion TCA Cream or Ointment Wound Cleansing: May shower and wash wound with soap and water. Primary Wound Dressing: Wound #28 Right,Lateral Lower Leg: Hydrofera Blue -  ready Wound #29 Right,Posterior Lower Leg: Hydrofera Blue - ready Wound #31 Right,Distal,Posterior Lower Leg: Hydrofera Blue - ready Wound #32 Left,Anterior Lower Leg: Hydrofera Blue - ready Wound #33 Left,Proximal,Lateral Lower Leg: Hydrofera Blue - ready Wound #34 Left,Distal,Lateral Lower Leg: Hydrofera Blue - ready Secondary Dressing: Wound #28 Right,Lateral Lower Leg: ABD pad Wound #29 Right,Posterior Lower Leg: ABD pad Wound #31 Right,Distal,Posterior Lower Leg: ABD pad Wound #32 Left,Anterior Lower Leg: ABD pad Wound #33 Left,Proximal,Lateral Lower Leg: ABD pad Wound #34 Left,Distal,Lateral Lower Leg: ABD pad Edema Control: 4 layer compression - Bilateral - unna boot at top to help secure wrap 1. We are absolutely making no progress here. 2. I have suggested to her to use her compression pumps twice a day although I have previously thought that once a day was the best we can hope for 3. She has increasing lower extremity edema and we are not going to be able to close these wounds like this. She tells me that during her last hospitalization her diuretics were stopped because of dehydration. She was apparently admitted for a UTI. I will try to check these records. She sees her primary physician on Friday 4. In spite of what I said in 3 there is no systemic evidence of fluid overload Electronic Signature(s) Signed: 02/14/2019 5:17:49 PM By: Linton Ham MD Entered By: Linton Ham on 02/14/2019 09:07:18 -------------------------------------------------------------------------------- SuperBill Details Patient Name: Date of Service: [name] [name], [name] [name] 02/14/2019 Medical Record [id number] Patient Account Number: [id number] Date of Birth/Sex: Treating RN: April 28, 1945 (73 y.o. [name] [name] Primary Care Provider: [name] Other Clinician: Referring Provider: Treating Provider/Extender:Frankie Zito, Tenna Child, [place] Siren in Treatment:  5 Diagnosis Coding ICD-10 Codes Code Description I89.0 Lymphedema, not elsewhere classified L97.221 Non-pressure chronic ulcer of left calf limited to breakdown of skin L97.211 Non-pressure chronic  ulcer of right calf limited to breakdown of skin E11.622 Type 2 diabetes mellitus with other skin ulcer Facility Procedures CPT4: Code [id number] ( Description: [id number] BILATERAL: Application of multi-layer venous compression system; leg below knee), including ankle and foot. Modifier Quantity: 1 Physician Procedures CPT4 Code Description: [id number] - WC PHYS LEVEL 3 - EST PT ICD-10 Diagnosis Description L97.221 Non-pressure chronic ulcer of left calf limited to br L97.211 Non-pressure chronic ulcer of right calf limited to b I89.0 Lymphedema, not elsewhere  classified Modifier: eakdown of skin reakdown of skin Quantity: 1 Electronic Signature(s) Signed: 02/14/2019 5:17:49 PM By: Linton Ham MD Entered By: Linton Ham on 02/14/2019 09:07:35

## 2019-03-14 NOTE — Progress Notes (Signed)
[name] [name], [name] [name] ([id number]) Visit Report for 01/24/2019 Arrival Information Details Patient Name: Date of Service: [name] [name], [name] [name] 01/24/2019 8:00 AM Medical Record [id number] Patient Account Number: [id number] Date of Birth/Sex: Treating RN: 03-21-1946 (73 y.o. Clearnce Sorrel Primary Care Torianna Junio: [name] Other Clinician: Referring Madalee Altmann: Treating Brion Sossamon/Extender:Robson, Tenna Child, [place] Siren in Treatment: 2 Visit Information History Since Last Visit Walker Added or deleted any medications: No Patient Arrived: Any new allergies or adverse reactions: No Arrival Time: 08:13 Had a fall or experienced change in No Accompanied By: self activities of daily living that may affect Transfer Assistance: None risk of falls: Patient Identification Verified: Yes Signs or symptoms of abuse/neglect since last No Secondary Verification Process Completed: Yes visito Patient Requires Transmission-Based No Hospitalized since last visit: No Precautions: Implantable device outside of the clinic excluding No Patient Has Alerts: No cellular tissue based products placed in the center since last visit: Has Dressing in Place as Prescribed: Yes Has Compression in Place as Prescribed: Yes Pain Present Now: No Electronic Signature(s) Signed: 01/24/2019 6:16:57 PM By: Kela Millin Entered By: Kela Millin on 01/24/2019 08:14:10 -------------------------------------------------------------------------------- Compression Therapy Details Patient Name: Date of Service: [name] [name], [name] [name] 01/24/2019 8:00 AM Medical Record [id number] Patient Account Number: [id number] Date of Birth/Sex: Treating RN: [birth year] (73 y.o. [name] Primary Care Jude Linck: [name] Other Clinician: Referring Tysean Vandervliet: Treating Keni Wafer/Extender:Robson, Tenna Child, [place] Siren in Treatment: 2 Compression Therapy Performed for Wound Wound  #28 Right,Lateral Lower Leg Assessment: Performed By: Clinician Carlene Coria, RN Compression Type: Four Layer Post Procedure Diagnosis Same as Pre-procedure Electronic Signature(s) Signed: 03/14/2019 3:01:15 PM By: Carlene Coria RN Entered By: Carlene Coria on 01/24/2019 08:54:06 -------------------------------------------------------------------------------- Compression Therapy Details Patient Name: Date of Service: [name] [name], [name] [name] 01/24/2019 8:00 AM Medical Record [id number] Patient Account Number: [id number] Date of Birth/Sex: Treating RN: 06/23/1945 (73 y.o. [name] Primary Care Kaliyah Gladman: [name] Other Clinician: Referring Osamah Schmader: Treating Rabiah Goeser/Extender:Robson, Tenna Child, [place] Siren in Treatment: 2 Compression Therapy Performed for Wound Wound #29 Right,Posterior Lower Leg Assessment: Performed By: Clinician Carlene Coria, RN Compression Type: Four Layer Post Procedure Diagnosis Same as Pre-procedure Electronic Signature(s) Signed: 03/14/2019 3:01:15 PM By: Carlene Coria RN Entered By: Carlene Coria on 01/24/2019 08:54:06 -------------------------------------------------------------------------------- Compression Therapy Details Patient Name: Date of Service: [name] [name], [name] [name] 01/24/2019 8:00 AM Medical Record [id number] Patient Account Number: [id number] Date of Birth/Sex: Treating RN: November 22, 1945 (73 y.o. [name] Primary Care Kiira Brach: [name] Other Clinician: Referring Vyom Brass: Treating Jenasis Straley/Extender:Robson, Tenna Child, [place] Siren in Treatment: 2 Compression Therapy Performed for Wound Wound #30 Right,Distal,Lateral Lower Leg Assessment: Performed By: Clinician Carlene Coria, RN Compression Type: Four Layer Post Procedure Diagnosis Same as Pre-procedure Electronic Signature(s) Signed: 03/14/2019 3:01:15 PM By: Carlene Coria RN Entered By: Carlene Coria on 01/24/2019  08:54:06 -------------------------------------------------------------------------------- Compression Therapy Details Patient Name: Date of Service: [name] [name], [name] [name] 01/24/2019 8:00 AM Medical Record [id number] Patient Account Number: [id number] Date of Birth/Sex: Treating RN: [birth year] (73 y.o. [name] Primary Care Altheria Shadoan: [name] Other Clinician: Referring Berley Gambrell: Treating Markelle Asaro/Extender:Robson, Tenna Child, [place] Si[place] in Treatment: 2 Compression Therapy Performed for Wound Wound #31 Right,Distal,Posterior Lower Leg Assessment: Performed By: Clinician Carlene Coria, RN Compression Type: Four Layer Post Procedure Diagnosis Same as Pre-procedure Electronic Signature(s) Signed: 03/14/2019 3:01:15 PM By: Carlene Coria RN Entered By: Carlene Coria on 01/24/2019 08:54:06 -------------------------------------------------------------------------------- Compression Therapy Details Patient Name: Date of Service: [name] [name], [name] [name] 01/24/2019 8:00 AM Medical Record [id number] Patient Account Number: [id number] Date of Birth/Sex: Treating RN: Apr 24, 1945 (73 y.o. F) Epps, Morey Hummingbird  Primary Care Laquinton Bihm: [name] Other Clinician: Referring Cylis Ayars: Treating Elverda Wendel/Extender:Robson, Tenna Child, [place] Si[place] in Treatment: 2 Compression Therapy Performed for Wound Wound #32 Left,Anterior Lower Leg Assessment: Performed By: Clinician Carlene Coria, RN Compression Type: Four Layer Post Procedure Diagnosis Same as Pre-procedure Electronic Signature(s) Signed: 03/14/2019 3:01:15 PM By: Carlene Coria RN Entered By: Carlene Coria on 01/24/2019 08:54:06 -------------------------------------------------------------------------------- Compression Therapy Details Patient Name: Date of Service: [name] [name], [name] [name] 01/24/2019 8:00 AM Medical Record [id number] Patient Account Number: [id number] Date of Birth/Sex: Treating  RN: September 30, 1945 (73 y.o. [name] Primary Care Kyliah Deanda: [name] Other Clinician: Referring Anshika Pethtel: Treating Avanelle Pixley/Extender:Robson, Tenna Child, [place] Si[place] in Treatment: 2 Compression Therapy Performed for Wound Wound #33 Left,Proximal,Lateral Lower Leg Assessment: Performed By: Clinician Carlene Coria, RN Compression Type: Four Layer Post Procedure Diagnosis Same as Pre-procedure Electronic Signature(s) Signed: 03/14/2019 3:01:15 PM By: Carlene Coria RN Entered By: Carlene Coria on 01/24/2019 08:54:06 -------------------------------------------------------------------------------- Compression Therapy Details Patient Name: Date of Service: [name] [name], [name] [name] 01/24/2019 8:00 AM Medical Record [id number] Patient Account Number: [id number] Date of Birth/Sex: Treating RN: 1945/09/05 (73 y.o. [name] Primary Care Zyier Dykema: [name] Other Clinician: Referring Deke Tilghman: Treating Chasty Randal/Extender:Robson, Tenna Child, [place] Si[place] in Treatment: 2 Compression Therapy Performed for Wound Wound #34 Left,Distal,Lateral Lower Leg Assessment: Performed By: Clinician Carlene Coria, RN Compression Type: Four Layer Post Procedure Diagnosis Same as Pre-procedure Electronic Signature(s) Signed: 03/14/2019 3:01:15 PM By: Carlene Coria RN Entered By: Carlene Coria on 01/24/2019 08:54:06 -------------------------------------------------------------------------------- Compression Therapy Details Patient Name: Date of Service: [name] [name], [name] [name] 01/24/2019 8:00 AM Medical Record [id number] Patient Account Number: [id number] Date of Birth/Sex: Treating RN: December 18, 1945 (73 y.o. [name] Primary Care Safia Panzer: [name] Other Clinician: Referring Janelie Goltz: Treating Joncarlo Friberg/Extender:Robson, Tenna Child, [place] Si[place] in Treatment: 2 Compression Therapy Performed for Wound Wound #35 Left,Distal,Anterior Lower  Leg Assessment: Performed By: Clinician Carlene Coria, RN Compression Type: Four Layer Post Procedure Diagnosis Same as Pre-procedure Electronic Signature(s) Signed: 03/14/2019 3:01:15 PM By: Carlene Coria RN Entered By: Carlene Coria on 01/24/2019 08:54:06 -------------------------------------------------------------------------------- Encounter Discharge Information Details Patient Name: Date of Service: [name] [name], [name] [name] 01/24/2019 8:00 AM Medical Record [id number] Patient Account Number: [id number] Date of Birth/Sex: Treating RN: 10/21/1945 (73 y.o. [name] Primary Care Tameshia Bonneville: [name] Other Clinician: Referring Ellora Varnum: Treating Terrius Gentile/Extender:Robson, Tenna Child, [place] Siren in Treatment: 2 Encounter Discharge Information Items Discharge Condition: Stable Ambulatory Status: Walker Discharge Destination: Home Transportation: Private Auto Accompanied By: alone Schedule Follow-up Appointment: Yes Clinical Summary of Care: Patient Declined Electronic Signature(s) Signed: 01/25/2019 6:50:57 PM By: Levan Hurst RN, BSN Entered By: Levan Hurst on 01/24/2019 12:06:52 -------------------------------------------------------------------------------- Lower Extremity Assessment Details Patient Name: Date of Service: [name] [name], [name] [name] 01/24/2019 8:00 AM Medical Record [id number] Patient Account Number: [id number] Date of Birth/Sex: Treating RN: 1945-09-13 (73 y.o. Clearnce Sorrel Primary Care Kyliah Deanda: [name] Other Clinician: Referring Jann Milkovich: Treating Suellyn Meenan/Extender:Robson, Tenna Child, [place] Si[place] in Treatment: 2 Edema Assessment Assessed: [Left: No] [Right: No] Edema: [Left: Yes] [Right: Yes] Calf Left: Right: Point of Measurement: cm From Medial Instep 52 cm 51 cm Ankle Left: Right: Point of Measurement: cm From Medial Instep 30.5 cm 33.5 cm Vascular Assessment Pulses: Dorsalis  Pedis Palpable: [Left:Yes] [Right:Yes] Electronic Signature(s) Signed: 01/24/2019 6:16:57 PM By: Kela Millin Entered By: Kela Millin on 01/24/2019 08:24:21 -------------------------------------------------------------------------------- Multi Wound Chart Details Patient Name: Date of Service: Trudell, Thula 01/24/2019 8:00 AM Medical Record [id number] Patient Account Number: [id number] Date of Birth/Sex: Treating RN: 02-14-1946 (73 y.o. [name] Primary Care Raeleigh Guinn: [name] Other Clinician:  Referring Hermenia Fritcher: Treating Heru Montz/Extender:Robson, Tenna Child, [place] Siren in Treatment: 2 Vital Signs Height(in): 57 Pulse(bpm): 89 Weight(lbs): 240 Blood Pressure(mmHg): 164/67 Body Mass Index(BMI): 33 Temperature(F): 97.7 Respiratory 18 Rate(breaths/min): Photos: [28:No Photos] [29:No Photos] [30:No Photos] Wound Location: [28:Right Lower Leg - LateralRight Lower Leg - Posterior Right Lower Leg - Lateral,] [30:Distal] Wounding Event: [28:Gradually Appeared] [29:Gradually Appeared] [30:Gradually Appeared] Primary Etiology: [28:Lymphedema] [29:Lymphedema] [30:Lymphedema] 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] [29:Lower Extremity] [30:Lower Extremity] Comorbid History: [28:Cataracts, Lymphedema, Cataracts, Lymphedema, Cataracts, Lymphedema, Arrhythmia, Congestive Heart Failure, Hypertension, Heart Failure, Hypertension, Heart Failure, Hypertension, Peripheral Venous Disease, Peripheral Venous Disease,  Peripheral Venous Disease, Type II Diabetes, Osteoarthritis, Neuropathy, Osteoarthritis, Neuropathy, Osteoarthritis, Neuropathy, Confinement Anxiety] [29:Arrhythmia, Congestive Type II Diabetes, Confinement Anxiety] [30:Arrhythmia, Congestive Type II  Diabetes, Confinement Anxiety] Date Acquired: [28:08/05/2018] [29:08/05/2018] [30:08/05/2018] Weeks of  Treatment: [28:2] [29:2] [30:2] Wound Status: [28:Open] [29:Open] [30:Open] Clustered Wound: [28:No] [29:No] [30:No] Clustered Quantity: [28:N/A] [29:N/A] [30:N/A] Measurements L x W x D 2x10.2x0.1 [29:1.3x1x0.1] [30:0.5x0.7x0.1] (cm) Area (cm) : [28:16.022] [29:1.021] [30:0.275] Volume (cm) : [28:1.602] [29:0.102] [30:0.027] % Reduction in Area: [28:55.20%] [29:42.20%] [30:71.10%] % Reduction in Volume: 55.20% [29:42.40%] [30:71.60%] Classification: [28:Full Thickness Without Exposed Support Structures Exposed Support Structures Exposed Support Structures] [29:Full Thickness Without] [30:Full Thickness Without] Exudate Amount: [28:Medium] [29:Medium] [30:Medium] Exudate Type: [28:Purulent] [29:Purulent] [30:Purulent] Exudate Color: [28:yellow, brown, green] [29:yellow, brown, green] [30:yellow, brown, green] Wound Margin: [28:Flat and Intact] [29:Flat and Intact] [30:Flat and Intact] Granulation Amount: [28:Medium (34-66%)] [29:Large (67-100%)] [30:Medium (34-66%)] Granulation Quality: [28:Red] [29:Red] [30:Pink, Pale] Necrotic Amount: [28:Medium (34-66%)] [29:Small (1-33%)] [30:Medium (34-66%)] Exposed Structures: [28:Fat Layer (Subcutaneous Fat Layer (Subcutaneous Fat Layer (Subcutaneous Tissue) Exposed: Yes Fascia: No Tendon: No Muscle: No Joint: No Bone: No] [29:Tissue) Exposed: Yes Fascia: No Tendon: No Muscle: No Joint: No Bone: No] [30:Tissue) Exposed: Yes  Fascia: No Tendon: No Muscle: No Joint: No Bone: No] Epithelialization: [28:Small (1-33%)] [29:None] [30:Small (1-33%)] Procedures Performed: Compression Therapy [29:Compression Therapy 31] [30:Compression Therapy 32] Photos: [28:No Photos] [29:No Photos] [30:No Photos] Wound Location: [28:Right Lower Leg - Posterior, Left Lower Leg - Anterior Distal] [30:Left Lower Leg - Lateral, Proximal] Wounding Event: [28:Gradually Appeared] [29:Gradually Appeared] [30:Gradually Appeared] Primary Etiology: [28:Lymphedema] [29:Lymphedema]  [30:Lymphedema] 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] [29:Lower Extremity] [30:Lower Extremity] Comorbid History: [28:Cataracts, Lymphedema, Cataracts, Lymphedema, Cataracts, Lymphedema, Arrhythmia, Congestive Heart Failure, Hypertension, Heart Failure, Hypertension, Heart Failure, Hypertension, Peripheral Venous Disease, Peripheral Venous Disease,  Peripheral Venous Disease, Type II Diabetes, Osteoarthritis, Neuropathy, Osteoarthritis, Neuropathy, Osteoarthritis, Neuropathy, Confinement Anxiety] [29:Arrhythmia, Congestive Type II Diabetes, Confinement Anxiety] [30:Arrhythmia, Congestive Type II  Diabetes, Confinement Anxiety] Date Acquired: [28:08/05/2018] [29:08/05/2018] [30:08/05/2018] Weeks of Treatment: [28:2] [29:2] [30:2] Wound Status: [28:Open] [29:Open] [30:Open] Clustered Wound: [28:No] [29:No] [30:Yes] Clustered Quantity: [28:N/A] [29:N/A] [30:2] Measurements L x W x D [28:1x1x0.1] [29:1.2x0.7x0.1] [30:3.5x10x0.1] (cm) Area (cm) : [28:0.785] [29:0.66] [30:27.489] Volume (cm) : [28:0.079] [29:0.066] [30:2.749] % Reduction in Area: [28:23.10%] [29:49.10%] [30:20.50%] % Reduction in Volume: [28:22.50%] [29:49.20%] [30:20.50%] Classification: [28:Full Thickness Without Exposed Support Structures Exposed Support Structures Exposed Support Structures] [29:Full Thickness Without] [30:Full Thickness Without] Exudate Amount: [28:Medium] [29:Medium] [30:Medium] Exudate Type: [28:Purulent] [29:Purulent] [30:Purulent] Exudate Color: [28:yellow, brown, green] [29:yellow, brown, green] [30:yellow, brown, green] Wound Margin: [28:Flat and Intact] [29:Flat and Intact] [30:Flat and Intact] Granulation Amount: [28:Medium (34-66%)] [29:Small (1-33%)] [30:Small (1-33%)] Granulation Quality: [28:Red] [29:Red] [30:Red, Pink] Necrotic Amount: [28:Medium (34-66%)] [29:Large (67-100%)] [30:Large (67-100%)] Exposed  Structures: [28:Fat  Layer (Subcutaneous Fat Layer (Subcutaneous Fat Layer (Subcutaneous Tissue) Exposed: Yes Fascia: No Tendon: No Muscle: No Joint: No Bone: No] [29:Tissue) Exposed: Yes Fascia: No Tendon: No Muscle: No Joint: No Bone: No] [30:Tissue) Exposed: Yes  Fascia: No Tendon: No Muscle: No Joint: No Bone: No] Epithelialization: [28:None] [29:None] [30:Small (1-33%)] Procedures Performed: [28:Compression Therapy 34] [29:Compression Therapy 35] [30:Compression Therapy N/A] Photos: [28:No Photos] [29:No Photos] [30:N/A] Wound Location: [28:Left Lower Leg - Lateral, Distal] [29:Left Lower Leg - Anterior, N/A Distal] Wounding Event: [28:Gradually Appeared] [29:Gradually Appeared] [30:N/A] Primary Etiology: [28:Lymphedema] [29:Lymphedema] [30:N/A] Secondary Etiology: [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] [30:N/A] Comorbid History: [28:Cataracts, Lymphedema, Cataracts, Lymphedema, N/A Arrhythmia, Congestive Heart Failure, Hypertension, Heart Failure, Hypertension, Peripheral Venous Disease, Peripheral Venous Disease, Type II Diabetes, Osteoarthritis, Neuropathy,  Osteoarthritis, Neuropathy, Confinement Anxiety] [29:Arrhythmia, Congestive Type II Diabetes, Confinement Anxiety] Date Acquired: [28:01/10/2019] [29:01/24/2019] [30:N/A] Weeks of Treatment: [28:2] [29:0] [30:N/A] Wound Status: [28:Open] [29:Open] [30:N/A] Clustered Wound: [28:No] [29:No] [30:N/A] Clustered Quantity: [28:N/A] [29:N/A] [30:N/A] Measurements L x W x D 1.9x1.9x0.1 [29:0.4x0.6x0.1] [30:N/A] (cm) Area (cm) : [28:2.835] [id number] [30:N/A] Volume (cm) : [28:0.284] [id number] [30:N/A] % Reduction in Area: [28:-5.50%] [29:N/A] [30:N/A] % Reduction in Volume: -5.60% [29:N/A] [30:N/A] Classification: [28:Full Thickness Without Exposed Support Structures Exposed Support Structures] [29:Full Thickness Without] [30:N/A] Exudate Amount: [28:Medium] [29:Medium] [30:N/A] Exudate Type: [28:Purulent] [29:Purulent]  [30:N/A] Exudate Color: [28:yellow, brown, green] [29:yellow, brown, green] [30:N/A] Wound Margin: [28:Flat and Intact] [29:N/A] [30:N/A] Granulation Amount: [28:Small (1-33%)] [29:Small (1-33%)] [30:N/A] Granulation Quality: [28:Red] [29:Pink] [30:N/A] Necrotic Amount: [28:Large (67-100%)] [29:Large (67-100%)] [30:N/A] Exposed Structures: [28:Fat Layer (Subcutaneous Tissue) Exposed: Yes Fascia: No Tendon: No Muscle: No Joint: No Bone: No] [29:Fat Layer (Subcutaneous Tissue) Exposed: Yes Fascia: No Tendon: No Muscle: No Joint: No Bone: No] [30:N/A] Epithelialization: [28:None Compression Therapy] [29:None Compression Therapy] [30:N/A N/A] Treatment Notes Electronic Signature(s) Signed: 01/24/2019 6:17:56 PM By: Linton Ham MD Signed: 03/14/2019 3:01:15 PM By: Carlene Coria RN Entered By: Linton Ham on 01/24/2019 09:05:20 -------------------------------------------------------------------------------- Multi-Disciplinary Care Plan Details Patient Name: Date of Service: MAYELI, SUNDARA 01/24/2019 8:00 AM Medical Record [id number] Patient Account Number: [id number] Date of Birth/Sex: Treating RN: 04-05-1946 (73 y.o. [name] Primary Care Marianny Goris: [name] Other Clinician: Referring Kinley Ferrentino: Treating Thurma Priego/Extender:Robson, Tenna Child, [place] Si[place] in Treatment: 2 Active Inactive Abuse / Safety / Falls / Self Care Management Nursing Diagnoses: Impaired physical mobility Potential for falls Goals: Patient will not develop complications from immobility Date Initiated: 01/10/2019 Target Resolution Date: 02/10/2019 Goal Status: Active Patient will not experience any injury related to falls Date Initiated: 01/10/2019 Target Resolution Date: 02/10/2019 Goal Status: Active Patient/caregiver will verbalize understanding of skin care regimen Date Initiated: 01/10/2019 Target Resolution Date: 02/10/2019 Goal Status: Active Interventions: Assess  Activities of Daily Living upon admission and as needed Assess fall risk on admission and as needed Assess: immobility, friction, shearing, incontinence upon admission and as needed Assess impairment of mobility on admission and as needed per policy Assess personal safety and home safety (as indicated) on admission and as needed Assess self care needs on admission and as needed Notes: Wound/Skin Impairment Nursing Diagnoses: Knowledge deficit related to ulceration/compromised skin integrity Goals: Patient/caregiver will verbalize understanding of skin care regimen Date Initiated: 01/10/2019 Target Resolution Date: 02/10/2019 Goal Status: Active Ulcer/skin breakdown will have a volume reduction of 30% by week 4 Date Initiated: 01/10/2019 Target Resolution Date: 02/10/2019 Goal Status: Active Interventions: Assess patient/caregiver ability to obtain necessary supplies Assess patient/caregiver ability to perform ulcer/skin care regimen upon admission and as needed  Assess ulceration(s) every visit Notes: Electronic Signature(s) Signed: 03/14/2019 3:01:15 PM By: Carlene Coria RN Entered By: Carlene Coria on 01/24/2019 08:32:03 -------------------------------------------------------------------------------- Pain Assessment Details Patient Name: Date of Service: LAWONA, HAGLER 01/24/2019 8:00 AM Medical Record [id number] Patient Account Number: [id number] Date of Birth/Sex: Treating RN: 14-Jun-1945 (73 y.o. Clearnce Sorrel Primary Care Aili Casillas: [name] Other Clinician: Referring Harpreet Signore: Treating Lashaun Poch/Extender:Robson, Tenna Child, [place] Si[place] in Treatment: 2 Active Problems Location of Pain Severity and Description of Pain Patient Has Paino No Site Locations Pain Management and Medication Current Pain Management: Electronic Signature(s) Signed: 01/24/2019 6:16:57 PM By: Kela Millin Entered By: Kela Millin on 01/24/2019  08:14:47 -------------------------------------------------------------------------------- Patient/Caregiver Education Details Patient Name: Date of Service: APIRL, ARZUAGA 10/20/2020andnbsp8:00 AM Medical Record [id number] Patient Account Number: [id number] Date of Birth/Gender: Treating RN: 1946/01/14 (73 y.o. [name] Primary Care Physician: [name] Other Clinician: Referring Physician: Treating Physician/Extender:Robson, Tenna Child, [place] Si[place] in Treatment: 2 Education Assessment Education Provided To: Patient Education Topics Provided Wound/Skin Impairment: Methods: Explain/Verbal Responses: State content correctly Electronic Signature(s) Signed: 03/14/2019 3:01:15 PM By: Carlene Coria RN Entered By: Carlene Coria on 01/24/2019 08:32:18 -------------------------------------------------------------------------------- Wound Assessment Details Patient Name: Date of Service: Skates, Forestine 01/24/2019 8:00 AM Medical Record [id number] Patient Account Number: [id number] Date of Birth/Sex: Treating RN: December 14, 1945 (73 y.o. Clearnce Sorrel Primary Care Marguerita Stapp: [name] Other Clinician: Referring Lowana Hable: Treating Malajah Oceguera/Extender:Robson, Tenna Child, [place] Si[place] in Treatment: 2 Wound Status Wound Number: [id number] Primary Lymphedema Etiology: Wound Location: Right Lower Leg - Lateral Secondary Diabetic Wound/Ulcer of the Lower Extremity Wounding Event: Gradually Appeared Etiology: Date Acquired: 08/05/2018 Wound Open Weeks Of Treatment: 2 Status: Clustered Wound: No Comorbid Cataracts, Lymphedema, Arrhythmia, History: Congestive Heart Failure, Hypertension, Peripheral Venous Disease, Type II Diabetes, Osteoarthritis, Neuropathy, Confinement Anxiety Photos Wound Measurements Length: (cm) 2 % Reduc Width: (cm) 10.2 % Reduc Depth: (cm) 0.1 Epithel Area: (cm) 16.022 Tunnel Volume: (cm) 1.602  Underm Wound Description Classification: Full Thickness Without Exposed Support Foul Od Structures Slough/ Wound Flat and Intact Margin: Exudate Medium Amount: Exudate Purulent Type: Exudate yellow, brown, green Color: Wound Bed Granulation Amount: Medium (34-66%) Granulation Quality: Red Fascia Necrotic Amount: Medium (34-66%) Fat Layer Necrotic Quality: Adherent Slough Tendon Exp Muscle Exp Joint Expo Bone Expos or After Cleansing: No Fibrino Yes Exposed Structure Exposed: No (Subcutaneous Tissue) Exposed: Yes osed: No osed: No sed: No ed: No tion in Area: 55.2% tion in Volume: 55.2% ialization: Small (1-33%) ing: No ining: No Electronic Signature(s) Signed: 01/26/2019 4:25:07 PM By: Mikeal Hawthorne EMT/HBOT Signed: 01/30/2019 5:21:50 PM By: Kela Millin Previous Signature: 01/24/2019 6:16:57 PM Version By: Kela Millin Entered By: Mikeal Hawthorne on 01/25/2019 10:41:51 -------------------------------------------------------------------------------- Wound Assessment Details Patient Name: Date of Service: Guillot, Darneshia 01/24/2019 8:00 AM Medical Record [id number] Patient Account Number: [id number] Date of Birth/Sex: Treating RN: Sep 03, 1945 (73 y.o. 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 Event: Gradually Appeared Etiology: Date Acquired: 08/05/2018 Wound Open Weeks Of Treatment: 2 Status: Clustered Wound: No Comorbid Cataracts, Lymphedema, Arrhythmia, History: Congestive Heart Failure, Hypertension, Peripheral Venous Disease, Type II Diabetes, Osteoarthritis, Neuropathy, Confinement Anxiety Photos Wound Measurements Length:  (cm) 1.3 % Reductio Width: (cm) 1 % Reductio Depth: (cm) 0.1 Epithelial Area: (cm) 1.021 Tunneling Volume: (cm) 0.102 Undermini Wound Description Classification: Full Thickness Without Exposed Support Foul  Od Structures Slough/ Wound Flat and Intact Margin: Exudate Medium Amount: Exudate Purulent Type: Exudate yellow, brown, green Color: Wound Bed Granulation Amount: Large (67-100%) Granulation Quality: Red Fascia Necrotic Amount: Small (1-33%) Fat Lay Necrotic Quality: Adherent Slough Tendon Muscle Joint E Bone Ex or After Cleansing: No Fibrino Yes Exposed Structure Exposed: No er (Subcutaneous Tissue) Exposed: Yes Exposed: No Exposed: No xposed: No posed: No n in Area: 42.2% n in Volume: 42.4% ization: None : No ng: No Electronic Signature(s) Signed: 01/26/2019 4:25:07 PM By: Mikeal Hawthorne EMT/HBOT Signed: 01/30/2019 5:21:50 PM By: Kela Millin Previous Signature: 01/24/2019 6:16:57 PM Version By: Kela Millin Entered By: Mikeal Hawthorne on 01/25/2019 10:42:39 -------------------------------------------------------------------------------- Wound Assessment Details Patient Name: Date of Service: Garczynski, Arnecia 01/24/2019 8:00 AM Medical Record [id number] Patient Account Number: [id number] Date of Birth/Sex: Treating RN: 03-29-1946 (73 y.o. 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 Event: Gradually Appeared Etiology: Date Acquired: 08/05/2018 Wound Open Weeks Of Treatment: 2 Status: Clustered Wound: No Comorbid Cataracts, Lymphedema, Arrhythmia, History: Congestive Heart Failure, Hypertension, Peripheral Venous Disease,  Type II Diabetes, Osteoarthritis, Neuropathy, Confinement Anxiety Photos Wound Measurements Length: (cm) 0.5 % Reduc Width: (cm) 0.7 % Reduc Depth: (cm) 0.1 Epithel Area: (cm) 0.275 Tunnel Volume: (cm) 0.027 Underm Wound Description Classification: Full Thickness Without Exposed Support Foul Od Structures Slough/ Wound Flat and Intact Margin: Exudate Medium Amount: Exudate Purulent Type: Exudate yellow, brown, green Color: Wound Bed Granulation Amount: Medium (34-66%) Granulation Quality: Pink, Pale Fascia Necrotic Amount: Medium (34-66%) Fat Lay Necrotic Quality: Adherent Slough Tendon Muscle Joint E Bone Ex or After Cleansing: No Fibrino Yes Exposed Structure Exposed: No er (Subcutaneous Tissue) Exposed: Yes Exposed: No Exposed: No xposed: No posed: No tion in Area: 71.1% tion in Volume: 71.6% ialization: Small (1-33%) ing: No ining: No Electronic Signature(s) Signed: 01/26/2019 4:25:07 PM By: Mikeal Hawthorne EMT/HBOT Signed: 01/30/2019 5:21:50 PM By: Kela Millin Previous Signature: 01/24/2019 6:16:57 PM Version By: Kela Millin Entered By: Mikeal Hawthorne on 01/25/2019 10:42:15 -------------------------------------------------------------------------------- Wound Assessment Details Patient Name: Date of Service: Victoria, Sonji 01/24/2019 8:00 AM Medical Record [id number] Patient Account Number: [id number] Date of Birth/Sex: Treating RN: [birth year] (73 y.o. 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 Event: Gradually Appeared Etiology: Date Acquired: 08/05/2018 Wound Open Weeks Of Treatment: 2 Status: Clustered  Wound: No Comorbid Cataracts, Lymphedema, Arrhythmia, History: Congestive Heart Failure, Hypertension, Peripheral Venous Disease, Type II Diabetes, Osteoarthritis, Neuropathy, Confinement Anxiety Photos Wound Measurements Length: (cm) 1 % Reduc Width: (cm) 1 % Reduc Depth: (cm) 0.1 Epithel Area: (cm) 0.785 Tunnel Volume: (cm) 0.079 Underm Wound Description Full Thickness Without Exposed Support Classification: Structures Wound Flat and Intact Margin: Exudate Medium Amount: Exudate Purulent Type: Exudate yellow, brown, green Color: Wound Bed Granulation Amount: Medium (34-66%) Granulation Quality: Red Necrotic Amount: Medium (34-66%) Necrotic Quality: Adherent Slough Foul Odor After Cleansing: No Slough/Fibrino Yes Exposed Structure Fascia Exposed: No Fat Layer (Subcutaneous Tissue) Exposed: Yes Tendon Exposed: No Muscle Exposed: No Joint Exposed: No Bone Exposed: No tion in Area: 23.1% tion in Volume: 22.5% ialization: None ing: No ining: No Electronic  Signature(s) Signed: 01/26/2019 4:25:07 PM By: Mikeal Hawthorne EMT/HBOT Signed: 01/30/2019 5:21:50 PM By: Kela Millin Previous Signature: 01/24/2019 6:16:57 PM Version By: Kela Millin Entered By: Mikeal Hawthorne on 01/25/2019 10:43:02 -------------------------------------------------------------------------------- Wound Assessment Details Patient Name: Date of Service: Voisin, Stanislawa 01/24/2019 8:00 AM Medical Record [id number] Patient Account Number: [id number] Date of Birth/Sex: Treating RN: [birth year] (73 y.o. 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 Event: Gradually  Appeared Etiology: Date Acquired: 08/05/2018 Wound Open Weeks Of Treatment: 2 Status: Clustered Wound: No Comorbid Cataracts, Lymphedema, Arrhythmia, History: Congestive Heart Failure, Hypertension, Peripheral Venous Disease, Type II Diabetes, Osteoarthritis, Neuropathy, Confinement Anxiety Photos Wound Measurements Length: (cm) 1.2 % Reduc Width: (cm) 0.7 % Reduc Depth: (cm) 0.1 Epithel Area: (cm) 0.66 Tunnel Volume: (cm) 0.066 Underm Wound Description Classification: Full Thickness Without Exposed Support Foul Od Structures Slough/ Wound Flat and Intact Margin: Exudate Medium Amount: Exudate Purulent Type: Exudate yellow, brown, green Color: Wound Bed Granulation Amount: Small (1-33%) Granulation Quality: Red Fascia Necrotic Amount: Large (67-100%) Fat Lay Necrotic Quality: Adherent Slough Tendon Muscle Joint E Bone Ex or After Cleansing: No Fibrino Yes Exposed Structure Exposed: No er (Subcutaneous Tissue) Exposed: Yes Exposed: No Exposed: No xposed: No posed: No tion in Area: 49.1% tion in Volume: 49.2% ialization: None ing: No ining: No Electronic Signature(s) Signed: 01/26/2019 4:25:07 PM By: Mikeal Hawthorne EMT/HBOT Signed: 01/30/2019 5:21:50 PM By: Kela Millin Previous Signature: 01/24/2019 6:16:57 PM Version By: Kela Millin Entered By: Mikeal Hawthorne on 01/25/2019 10:45:34 -------------------------------------------------------------------------------- Wound Assessment Details Patient Name: Date of Service: Saldivar, Jeannemarie 01/24/2019 8:00 AM Medical Record [id number] Patient Account Number: [id number] Date of Birth/Sex: Treating RN: 08-Dec-1945 (73 y.o. 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 Event: Gradually Appeared Etiology: Date Acquired: 08/05/2018 Wound Open Weeks Of Treatment: 2 Status: Clustered Wound: Yes Comorbid Cataracts, Lymphedema, Arrhythmia, History: Congestive Heart Failure, Hypertension, Peripheral Venous Disease, Type II Diabetes, Osteoarthritis, Neuropathy, Confinement Anxiety Photos Wound Measurements Length: (cm) 3.5 % Reduc Width: (cm) 10 % Reduc Depth: (cm) 0.1 Epithel Clustered Quantity: 2 Tunneli Area: (cm) 27.489 Underm Volume: (cm) 2.749 Wound Description Classification: Full Thickness Without Exposed Support Foul Od Structures Slough/ Wound Flat and Intact Margin: Exudate Medium Amount: Exudate Purulent Type: Exudate yellow, brown, green Color: Wound Bed Granulation Amount: Small (1-33%) Granulation Quality: Red, Pink Fascia Necrotic Amount: Large (67-100%) Fat Lay Necrotic Quality: Adherent Slough Tendon Muscle Joint E Bone Ex or After Cleansing: No Fibrino Yes Exposed Structure Exposed: No er (Subcutaneous Tissue) Exposed: Yes Exposed: No Exposed: No xposed: No posed: No tion in Area: 20.5% tion in Volume: 20.5% ialization: Small (1-33%) ng: No ining: No Electronic Signature(s) Signed: 01/26/2019 4:25:07 PM By: Mikeal Hawthorne EMT/HBOT Signed: 01/30/2019 5:21:50 PM By: Kela Millin Previous Signature: 01/24/2019 6:16:57 PM Version By: Kela Millin Entered By: Mikeal Hawthorne on 01/25/2019 10:46:00 -------------------------------------------------------------------------------- Wound Assessment Details Patient Name: Date of Service: Teale, Mashonda 01/24/2019 8:00 AM Medical Record [id number] Patient Account Number: [id number] Date of Birth/Sex: Treating RN: [birth year] (73 y.o. 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 Event: Gradually Appeared Etiology: Date Acquired: 01/10/2019 Wound Open Weeks Of Treatment: 2 Status: Clustered Wound: No Comorbid Cataracts, Lymphedema, Arrhythmia, History: Congestive Heart Failure, Hypertension, Peripheral Venous Disease, Type II Diabetes, Osteoarthritis, Neuropathy, Confinement Anxiety Photos Wound Measurements Length: (cm) 1.9 % Reduc Width: (cm) 1.9 % Reduc Depth: (cm) 0.1 Epithel Area: (cm) 2.835 Tunnel Volume: (cm) 0.284 Underm Wound Description Classification: Full Thickness Without Exposed Support Structures Wound Flat and Intact Margin: Exudate Medium Amount: Exudate Purulent Type: Exudate yellow, brown, green Color: Wound Bed Granulation Amount: Small (1-33%) Granulation Quality: Red Necrotic Amount: Large (67-100%) Necrotic Quality: Adherent Slough Foul Odor After Cleansing: No Slough/Fibrino Yes Exposed Structure Fascia Exposed: No Fat Layer (Subcutaneous Tissue) Exposed: Yes Tendon Exposed: No Muscle Exposed: No Joint Exposed: No Bone Exposed: No tion in Area: -5.5% tion in Volume: -5.6% ialization: None ing: No ining: No Electronic Signature(s) Signed: 01/26/2019 4:25:07 PM By: Mikeal Hawthorne EMT/HBOT Signed: 01/30/2019 5:21:50 PM By: Kela Millin Previous Signature: 01/24/2019 6:16:57 PM Version By: Kela Millin Entered By: Mikeal Hawthorne on 01/25/2019 10:45:12 -------------------------------------------------------------------------------- Wound Assessment Details Patient Name: Date of Service: Hooser, Cynthia 01/24/2019 8:00 AM Medical Record [id number] Patient Account Number: [id number] Date of Birth/Sex: Treating RN: Dec 18, 1945 (73 y.o. Clearnce Sorrel Primary Care Mohammedali Bedoy: [name] Other Clinician: Referring Kodee Drury: Treating Huyen Perazzo/Extender:Robson, Tenna Child, [place] Siren in Treatment: 2 Wound Status Wound Number: 35 Primary Lymphedema Etiology: Wound Location: Left Lower Leg - Anterior, Distal Wound Open Wounding Event: Gradually Appeared Status: Date Acquired: 01/24/2019 Comorbid Cataracts, Lymphedema, Arrhythmia, Weeks Of Treatment: 0 History: Congestive Heart Failure, Hypertension, Clustered Wound: No Peripheral Venous Disease, Type II Diabetes, Osteoarthritis, Neuropathy, Confinement Anxiety Photos Wound Measurements Length: (cm) 0.4 % Reduct Width: (cm) 0.6 % Reduct Depth: (cm) 0.1 Epitheli Area: (cm) 0.188 Tunneli Volume: (cm) 0.019 Undermi Wound Description Full Thickness Without Exposed Support Slough/F Classification: Structures Exudate Medium Amount: Exudate Purulent Type: Exudate yellow, brown, green Color: Wound Bed Granulation Amount: Small (1-33%) Granulation Quality: Pink Fascia E Necrotic Amount: Large (67-100%) Fat Laye Necrotic Quality: Adherent Slough Tendon E Muscle E Joint Ex Bone Exp ibrino Yes Exposed Structure xposed: No r (Subcutaneous Tissue) Exposed: Yes xposed: No xposed: No posed: No osed: No ion in Area: 0% ion in Volume: 0% alization: None ng: No ning: No Electronic Signature(s) Signed: 01/26/2019 4:25:07 PM By: Mikeal Hawthorne EMT/HBOT Signed: 01/30/2019 5:21:50 PM By: Kela Millin Previous Signature: 01/24/2019 6:16:57 PM Version By: Kela Millin Entered By: Mikeal Hawthorne on 01/25/2019 10:46:24 -------------------------------------------------------------------------------- Vitals Details Patient Name: Date of Service: Diebold, Wendelyn 01/24/2019 8:00 AM Medical Record [id number] Patient Account Number: [id number] Date of Birth/Sex: Treating RN: [birth year] (73 y.o. Clearnce Sorrel Primary Care  Braheem Tomasik: [name] Other Clinician: Referring Kemya Shed: Treating Tajae Rybicki/Extender:Robson, Tenna Child, [place] Siren in Treatment: 2 Vital Signs Time Taken: 08:14 Temperature (F): 97.7 Height (in): 71 Pulse (bpm): 91 Weight (lbs): 240 Respiratory Rate (breaths/min): 18 Body Mass Index (BMI): 33.5 Blood Pressure (mmHg): 164/67 Reference Range: 80 - 120 mg / dl Electronic Signature(s) Signed: 01/24/2019 6:16:57 PM By: Kela Millin Entered By: Kela Millin on 01/24/2019 08:14:41

## 2019-03-14 NOTE — Progress Notes (Signed)
JAMIYAH, PLESCIA ([id number]) Visit Report for 01/31/2019 Debridement Details Patient Name: Date of Service: TYJUANA, SOTOLONGO 01/31/2019 8:15 AM Medical Record [id number] Patient Account Number: [id number] Date of Birth/Sex: Treating RN: 05-28-1945 (73 y.o. [name] Primary Care Provider: [name] Other Clinician: Referring Provider: Treating Provider/Extender:Robson, Tenna Child, [place] Siren in Treatment: 3 Debridement Performed for Wound #32 Left,Anterior Lower Leg Assessment: Performed By: Physician Ricard Dillon., MD Debridement Type: Debridement Severity of Tissue Pre Fat layer exposed Debridement: Level of Consciousness (Pre- Awake and Alert procedure): Pre-procedure Verification/Time Out Taken: Yes - 09:05 Start Time: 09:05 Pain Control: Lidocaine 5% topical ointment Total Area Debrided (L x W): 0.5 (cm) x 0.4 (cm) = 0.2 (cm) Tissue and other material Viable, Non-Viable, Slough, Subcutaneous, Slough debrided: Level: Skin/Subcutaneous Tissue Debridement Description: Excisional Instrument: Curette Bleeding: Moderate Hemostasis Achieved: Pressure End Time: 09:14 Procedural Pain: 0 Post Procedural Pain: 0 Response to Treatment: Procedure was tolerated well Level of Consciousness Awake and Alert (Post-procedure): Post Debridement Measurements of Total Wound Length: (cm) 0.5 Width: (cm) 0.4 Depth: (cm) 0.1 Volume: (cm) 0.016 Character of Wound/Ulcer Post Improved Debridement: Severity of Tissue Post Debridement: Fat layer exposed Post Procedure Diagnosis Same as Pre-procedure Electronic Signature(s) Signed: 01/31/2019 6:05:35 PM By: Linton Ham MD Signed: 03/14/2019 3:01:15 PM By: Carlene Coria RN Entered By: Linton Ham on 01/31/2019 09:44:36 -------------------------------------------------------------------------------- Debridement Details Patient Name: Date of Service: TANISA, TONI 01/31/2019 8:15  AM Medical Record [id number] Patient Account Number: [id number] Date of Birth/Sex: Treating RN: 02-10-1946 (73 y.o. [name] Primary Care Provider: [name] Other Clinician: Referring Provider: Treating Provider/Extender:Robson, Tenna Child, [place] Siren in Treatment: 3 Debridement Performed for Wound #33 Left,Proximal,Lateral Lower Leg Assessment: Performed By: Physician Ricard Dillon., MD Debridement Type: Debridement Severity of Tissue Pre Fat layer exposed Debridement: Level of Consciousness (Pre- Awake and Alert procedure): Pre-procedure Yes - 09:05 Verification/Time Out Taken: Start Time: 09:05 Pain Control: Lidocaine 5% topical ointment Total Area Debrided (L x W): 3 (cm) x 3 (cm) = 9 (cm) Tissue and other material Viable, Non-Viable, Slough, Subcutaneous, Slough debrided: Level: Skin/Subcutaneous Tissue Debridement Description: Excisional Instrument: Curette Bleeding: Moderate Hemostasis Achieved: Pressure End Time: 09:14 Procedural Pain: 0 Post Procedural Pain: 0 Response to Treatment: Procedure was tolerated well Level of Consciousness Awake and Alert (Post-procedure): Post Debridement Measurements of Total Wound Length: (cm) 7.5 Width: (cm) 13 Depth: (cm) 0.1 Volume: (cm) 7.658 Character of Wound/Ulcer Post Improved Debridement: Severity of Tissue Post Debridement: Fat layer exposed Post Procedure Diagnosis Same as Pre-procedure Electronic Signature(s) Signed: 01/31/2019 6:05:35 PM By: Linton Ham MD Signed: 03/14/2019 3:01:15 PM By: Carlene Coria RN Entered By: Linton Ham on 01/31/2019 09:51:36 -------------------------------------------------------------------------------- Debridement Details Patient Name: Date of Service: MYNA, VAUGHN 01/31/2019 8:15 AM Medical Record [id number] Patient Account Number: [id number] Date of Birth/Sex: Treating RN: 12/30/1945 (73 y.o. [name] Primary  Care Provider: [name] Other Clinician: Referring Provider: Treating Provider/Extender:Robson, Tenna Child, [place] Siren in Treatment: 3 Debridement Performed for Wound #34 Left,Distal,Lateral Lower Leg Assessment: Performed By: Physician Ricard Dillon., MD Debridement Type: Debridement Severity of Tissue Pre Fat layer exposed Debridement: Level of Consciousness (Pre- Awake and Alert procedure): Pre-procedure Verification/Time Out Taken: Yes - 09:05 Start Time: 09:05 Pain Control: Lidocaine 5% topical ointment Total Area Debrided (L x W): 3 (cm) x 3.5 (cm) = 10.5 (cm) Tissue and other material Viable, Non-Viable, Slough, Subcutaneous, Slough debrided: Level: Skin/Subcutaneous Tissue Debridement Description: Excisional Instrument: Curette Bleeding: Moderate Hemostasis Achieved: Pressure End Time: 09:14 Procedural Pain: 0 Post  Procedural Pain: 0 Response to Treatment: Procedure was tolerated well Level of Consciousness Awake and Alert (Post-procedure): Post Debridement Measurements of Total Wound Length: (cm) 6.5 Width: (cm) 7 Depth: (cm) 0.1 Volume: (cm) 3.574 Character of Wound/Ulcer Post Improved Debridement: Severity of Tissue Post Debridement: Fat layer exposed Post Procedure Diagnosis Same as Pre-procedure Electronic Signature(s) Signed: 01/31/2019 6:05:35 PM By: Linton Ham MD Signed: 03/14/2019 3:01:15 PM By: Carlene Coria RN Entered By: Linton Ham on 01/31/2019 09:52:08 -------------------------------------------------------------------------------- HPI Details Patient Name: Date of Service: KAYLIANN, HRNCIR 01/31/2019 8:15 AM Medical Record [id number] Patient Account Number: [id number] Date of Birth/Sex: Treating RN: 1945/10/02 (73 y.o. [name] Primary Care Provider: [name] Other Clinician: Referring Provider: Treating Provider/Extender:Robson, Tenna Child, [place] Siren in  Treatment: 3 History of Present Illness HPI Description: 04/09/15; the patient is here for review of wounds on her bilateral lower extremities for the last month. The patient has a history of lymphedema and bilateral chronic venous insufficiency with inflammation she was here for review of wounds on her bilateral legs in 2014 she was discharged home with external compression pumps which she is currently not using. Over the last month she developed wounds on her bilateral lower legs with drainage and pain and she is here for our review of this. 04/16/15; the patient's edema is under much better control. She has 3 areas one on the right anterior medial leg one on the left posterior leg and a small open area with purulent drainage on the left anterior leg. I have cultured the area on the left anterior leg 04/23/15; continued improvement in the patient's edema. All her wounds of closed I see no open areas. Culture from the left anterior leg last time was negative. READMISSION 02/07/16; this patient is a woman that I saw her earlier in 2017. I believe we discharged her very quickly with bilateral wounds in her legs secondary to chronic venous hypertension with inflammation and secondary lymphedema. She had juxtalite stockings. She has external compression pumps at home from a stay in our clinic in 2014. She tells me that she's had wounds on her bilateral lower legs since March or April of this year. 3 wounds on the left 2 on the right. She is a type II diabetic and has a history of noncompressible vessels bilaterally although she is tolerated 4-layer compression. The last note from her primary physician Dr. [name] was in May. At that point Dr. [name] had referred the patient here for lymphedema management. The patient stated she made a phone call to year but was not able to arrange an appointment. It is not clear that she is actually using anything on the wounds currently except some gauze  that was saturated with drainage per our intake nurse. She is certainly not using her juxtalite stockings and by the patient's own admission she is not using her compression pumps because they are "irritating". I don't see a recent hemoglobin A1c. The patient states she has not been systemically unwell but the legs are painful 02/14/16; patient tolerated her Profore wraps reasonably well. She is using the compression pumps 1 time per day. Measurements of her legs are better in terms of the amount of lymphedema 02/21/16; patient tolerated her Profore wraps it. She says she is using her compression pumps one or 2 times a day. Measurements of her leg circumference are improved and most of her wound dimensions are better, unfortunately the home health that we referred her to did not except her insurance but works  successful in getting her accompanied that would except her insurance however she apparently has a $25 co-pay which she cannot afford as she is on a fixed income 03/05/16; patient states she did not back using the pumps. She is healed that 2 wounds on the left leg. She still has areas on the right lateral lower leg, right medial lower leg and left lateral lower leg. All of these look some better and have reduced in area 03/13/16; still 3 wounds 1 on the left lateral leg, one on the left medial/posterior leg which is really the most extensive and a small area on the right lateral leg. All of this in the setting of severe chronic lymphedema and chronic venous inflammation. Damage to the skin with cobblestone thickened skin. 03/20/16; still 2 areas. The area on the left lateral leg appears to of closed over. The right medial leg is still most extensive wound although it appears to be closing over right lateral only a small open area remains. Using silver alginate Profore wraps 03/27/16; the area on the left leg remains closed. The edema control is better on the left than the right. The  right medial leg is still weeping but everything appears to be epithelialized. The lateral aspect of the right leg appears closed. She tells me she has old juxtalite stockings. She has been compliant with her compression pumps. 04/03/16; the area on the left leg remains closed. She has ordered new juxtalite stockings. The right posterior medial wound is fully epithelialized. However she has an irritated area over the right Achilles that she states is been itching all week under the wraps.. She has been compliant with her compression pumps 04/10/16; the area on the left leg remains closed. The right posterior medial wound is also closed today. The irritated area over the right Achilles which was probably wrap injury is also fully epithelialized and closed. The patient has not received her new juxtalite stockings, she arrives in clinic today with an old juxtalite stocking in place. Sheis using her compression pumps secondary to her severe stage III lymphedema READMISSION 01/21/17; this is a patient we know from several previous stays in this clinic. She has lymphedema with chronic venous insufficiency and inflammation. The last time she was here we discharged her with bilateral juxta lites which she is compliant with. She also has external compression pumps at home from stays in our clinic in 2014 although she tells me that she is not very compliant with these as when she uses them a causes pain in her bilateral knees the patient states that her wounds on her left greater than right leg including 2 large areas on the left anterior and left lateral leg and an area on the right leg open in late June or July since then she is been followed by [facility] and she's had bilateral Unna boot supplied every week for the last month.. I think the wounds have actually improved although they are not specifically dressed. She states she had an ultrasound to rule out DVT bilaterally that was negative. She does  not have arterial studies although she is a diabetic. She states she has a lot of pain in her legs she states she does not use her external compression pumps because it causes pain in her knees. As usual her ABIs were noncompressible bilaterally in this clinic 01/28/17; currently the patient only has one small wound on the left lateral leg and I think this should be healed next week. We are going  to try to get her bilateral extremitease stockings. She tells me that compression pumps heard her posterior rib arthritic knees so she is not been compliant with this 02/04/17; the patient's wounds are totally healed. She has chronic venous insufficiency and secondary lymphedema. She has new extremitease stockings. She also has compression pumps Readmission: 04/21/17 on evaluation today patient appears to be doing about the same as what she was previous when we were evaluating her back in November 2018. At that point she had completely healed. With that being said she has now reopened and states that she wasn't aware that she was supposed to come back. 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 Subsequently she also states that although she has the compression that we got for her previously the extremitease stockings she nonetheless doesn't always wear these and being noncompliant often has things will reopen as far as ulcers on her lower extremities. No fevers, chills, nausea, or vomiting noted at this time. Patient has no evidence of dementia. She is having some discomfort in regard to the ulcers at this point. 04/28/17 on evaluation today patient appears to be doing significantly better in regard to her wounds. With that being said she is still having significant discomfort and is not really keen on sharp debridement. She does not appear to have any evidence of infection which is good news I do  think the Iodoflex is beneficial for her. Of note her ABI's were found in the system to be on the right 1.21 with a TBI of 0.76 and on the left 1.35 with a TBI of 0.75. In general her swelling appears to be significantly improved however. No fevers, chills, nausea, or vomiting noted at this time. Patient has been tolerating the wraps without complication. She has no evidence of dementia. 05/05/17-she is here in follow-up evaluation for bilateral lower extremity venous and lymphedema ulcers. She states she has not been using her lymphedema pumps secondary to pain at her knees. After discussion it was noted that she uses knee-high lymphedema pumps but does have a pair of thigh-high lymphedema pumps. She was strongly advised to use the thigh-high lymphedema pumps and if necessary reduce the setting for improved tolerance. She reports no change in odor and/or drainage. She continues to tolerate sharp debridement poorly with significant pain, primarily to the right lower extremity. We will continue with current treatment plan, along with improved lymphedema pump use and follow-up next week 05/12/17 on evaluation today patient actually appears to be doing much better in regard to her bilateral lower extremity ulcers. Only the right altar actually requires debridement today. The other two cleaned off nicely in two of the three in fact on the left lower extremity or actually almost completely healed. She does have some weeping noted still the bilateral lower extremities left greater than right. She still is not able to use her compression pumps even though I have mentioned this several times to her as did Leah last week. No fevers, chills, nausea, or vomiting noted at this time. Patient has been tolerating the dressing changes without complication. Patient has no evidence of dementia 05/19/17 on evaluation today patient appears to be doing very well regard to her bilateral lower extremity ulcers. In fact she  seems to be improving and in fact healed in a couple of locations that were giving her trouble last week. Overall I'm extremely pleased with how things  have progressed. She still has some discomfort especially in the right lateral lower extremity. Fortunately there does not appear to be any evidence of significant infection which is good news she does tell me that she has used her compression pumps several times although not routinely over the past week I did praise her for this. I do think it will be beneficial and that might even be what's helping with her wounds as far as healing is concerned at least to a degree. No fevers, chills, nausea, or vomiting noted at this time. Patient has no dementia and no evidence of infection. 05/26/17 on evaluation today patient presents for evaluation concerning her bilateral lower extremity ulcer secondary to lymphedema. Fortunately she appears to be doing very well at this point in time. Specifically her right leg appears to be completely closed and healed. The left leg though there is still a small area of opening overall has healed. She does have a little bit of excoriation at the top or the wraps are because they slid down on her. Otherwise there's no evidence of infection and the other has improved dramatically now that she is not draining as significantly. Patient is having no significant pain she tells me she has been able to use her lymphedema pumps one time a day although that's not all she can tolerate. Even that is difficult for her. 06/02/17 on evaluation today patient's ulcers appear to be completely closed at this point. She does still note some odor but I believe this is due to the fact she has not been able to wash her legs as well currently. Fortunately she did bring her compression with her today. She does have the EXTREMIT-EASE Compression. With that being said she is somewhat nervous about going back to this as previously she broke out yet  again once we discontinue wrapping her. READMISSION 11/23/17; patient is now 73 year old diabetic woman with severe chronic venous insufficiency with lymphedema. We care for her last in this clinic in February 2019 at which time she had chronic venous insufficiency with lymphedema bilateral lower extremity ulcers. We discharged her with bilateral wrap around/extremitease stockings and compression pumps. Is fairly clear she is not using the compression pumps stating it causes pain in her knees. In any case she was admitted to hospital from 8/3 through 8/6 with a several week history of recurrent bilateral ulcers. She was felt to have bilateral cellulitis treated with doxycycline IV. She is still on oral doxycycline and has another 2 days worth. At discharge her white count was 14.1 hemoglobin at 8.9. The patient is a diabetic however her last ABIs in 2018 showed a right ABI of 1.21, left of 1.35. TBI's were 0.76 on the right 0.75 on the left. Posterior tibial artery was monophasic on the right triphasic on the left. Dorsalis pedis triphasic on the right and triphasic on the left. She is not felt to have significant macrovascular disease 12/02/17;significant bilateral lower extremity wounds secondary to severe uncontrolled lymphedema. She did not get home health out to change the wraps. So she kept this on all week. She is telling me she using his her compression pumps once a week. In general all the wounds look better. We've been using silver alginate with secondary absorptive dressings 4 layer compression 12/10/17; bilateral lower extremity wounds secondary to uncontrolled lymphedema. She tells me she is using her compression pumps on most days. We've been keeping wraps on all week. Using silver alginate. She has wounds on the right lateral calf  left medial and left lateral calf. All of this is somewhat better 12/16/17; the patient has 3 small open areas. One on the right lateral calf, one on the left  medial calf and the small area on the left lateral calf. She has lymphedema, chronic venous insufficiency with hemosiderin deposition. Very fibrotic distal lower extremity scan with suggestion of an inverted bottle sign appearance to her legs. She has compression pumps but she does not use them has not used them in the last week. Currently we have her in 4 layer compression, silver alginate to the wounds and this is being changed once by home health. She is noncompliant with the compression pumps because she says it hurts her knees. She also has compression stockings ando Juxta light stockings at home 12/24/17; the patient's right lateral calf is healed as well as the left medial. There is no open wound on the right leg. She still has 2 areas on the left lateral calf. We transitioned the right leg into an extremitease stocking which she brought in from home. 12/31/17; the patient has a superficial wound on the left lateral lower calf. We've been wrapping this leg. She arrives today with the right leg swollen but without a wound. She cannot get her extremities stocking on the right leg. There is a lot more edema here. She is not using her compression pumps 01/10/2018; patient has a same superficial wound on the left lateral calf. Her right leg has less peripheral edema. She tells me she also started herself on some Lasix that had been previously prescribed for her at some point but she had never taken it. She is also concerned about whether her compression pumps are leaking. She has extremitease stockings however the edema gets bad enough she can get these on. Currently she has no open wound on the right leg and is mention the edema is actually better than when I saw this 10 days ago 01/17/2018; patient has a same superficial area on the posterior left calf and a weeping area in the mid part of the right lateral calf. She still does not have anyone from [facility] coming to see her compression  pumps which she describes as being suspicious for a leakage. I am not really sure how frequently she is using this in any case. She has massive bilateral calf lymphedema. I think there is some spreading lymphedema into her posterior thighs. 01/24/2018; the patient's edema in her legs is actually some better. She still has a smaller area on the left posterior calf although this is better. The weeping area on the right lateral calf is also better. As it turned out she did not get her pumps from [facility] but [facility] and they are going to try to go out and see these this week which is going to help. I explained to her that she will use the compression pumps once a day while we are wrapping her legs but when she transitions to stockings that may be she will require pumping twice a day. Her compliance in this area has not been high and I wonder about her ability to do this herself. 02/01/2018; the area on the posterior left leg is healed. She still has the original wound on the right lateral calf which is epithelialized. The however just above this there is still weeping lymphedema fluid. She has a new area on the upper right calf which is either wrap injury or is she points out where she has been scratching  under the wraps. We still not have managed to get a hold of a representative of [facility] to see if the pump is operational. She has extremitease stockings at home however I am not of the opinion that this will maintain her skin integrity without the compression pumps. 02/08/2018; posterior left leg remains healed although she has a new small open area on the left lateral calf. Right lateral calf has two quarter sized open areas and close juxtaposition. There is less weeping edema fluid. She apparently managed to contact [facility]. They are sending her a part. But she is still not received it 02/15/2018; the patient has no open wound on either leg. Some scale  present on the right upper lateral calf just below the fibular head. Edema control is excellent. She did not bring her stockings or her juxta lite wrap around. She has the part for her compression pump but she has not use the compression pumps yet READMISSION 01/10/2019 Patient is now a 73 year old woman that we have had in the clinic multiple times in the past. She has chronic lower extremity lymphedema, recurrent wounds on her bilateral lower extremities. She is also a type II diabetic reasonably well controlled with a recent hemoglobin A1c while she was in the hospital of 5.9. She has wounds on her bilateral lower extremities mostly on the left lateral and right lateral. These were noted also during her recent hospitalization. The patient states they have been there for a while. She has not been able to wear her stockings he has trouble getting them on as she lives alone. She also has external compression pumps but uses them sparingly because it causes pain in her knees which hurt because of osteoarthritis. The patient was on doxycycline before she went into the hospital apparently for fear of infection in her legs and is on Keflex coming out because of a UTI The patient was recently in the hospital from 01/06/2019 through 01/09/2019. She was admitted with nausea and vomiting and prerenal azotemia. This was corrected with IV fluid her Lasix has been put on hold. Hemoglobin A1c at 5.9 her metformin was discontinued. She was also felt to have a UTI she was prescribed Keflex at 503 times a day she is picking that up today. The patient is not felt to have an arterial issue. ABIs in our clinic were 1.24 on the right and 1.06 on the left. She had formal studies in 2018 at which time her ABIs were 1.21 on the right TBI of 0.76 and on the left 1.35 and 0.75 respectively. Waveforms are on the right were monophasic and biphasic, triphasic and biphasic on the left. She is tolerated 4 layer compression in  the past 10/13. Comes in today having not used her compression pumps /perhaps once in the last week. She has too much edema to consider healing these wounds. We have been using silver alginate 10/20; she tells me he had she used her compression pumps once a day as we asked. Clearly she has edema out of her legs although most of it seems to be settling around her knees which is what she complains about. We have been using silver alginate to the extensive de-epithelialized area on the left lateral calf and a much smaller area on the right lateral extending linearly into the right posterior 10/27; she is using her compression pumps daily. Her edema control is better. We have been using silver alginate to the extensive wound areas on her bilateral lower legs. This  includes left anterior left lateral and left posterior. Right anterior right lateral and right posterior. The wounds are not all in the same condition. She has severe bilateral skin damage with the epithelialization, flaking dried epithelium Electronic Signature(s) Signed: 01/31/2019 6:05:35 PM By: Linton Ham MD Entered By: Linton Ham on 01/31/2019 09:46:35 -------------------------------------------------------------------------------- Physical Exam Details Patient Name: Date of Service: STAYSHA, FEITH 01/31/2019 8:15 AM Medical Record [id number] Patient Account Number: [id number] Date of Birth/Sex: Treating RN: 06/04/1945 (72 y.o. [name] Primary Care Provider: [name] Other Clinician: Referring Provider: Treating Provider/Extender:Robson, Tenna Child, [place] Siren in Treatment: 3 Constitutional Sitting or standing Blood Pressure is within target range for patient.. Pulse regular and within target range for patient.Marland Kitchen Respirations regular, non-labored and within target range.. Temperature is normal and within the target range for the patient.Marland Kitchen Appears in no distress. Notes Wound  exam She has 3 open areas on the left lateral calf with some depth. 100% covered and tightly adherent necrotic debris debrided with an open curette hemostasis with direct pressure there is also a smaller open area on the left posterior. All of these have some depth versus the right side On the right lateral calf a large area of wound with denuded epithelium. Smaller areas anteriorly posteriorly. No debridement was required in any of these areas Electronic Signature(s) Signed: 01/31/2019 6:05:35 PM By: Linton Ham MD Entered By: Linton Ham on 01/31/2019 09:47:48 -------------------------------------------------------------------------------- Physician Orders Details Patient Name: Date of Service: KYANI, AGIUS 01/31/2019 8:15 AM Medical Record [id number] Patient Account Number: [id number] Date of Birth/Sex: Treating RN: 05-Oct-1945 (73 y.o. [name] Primary Care Provider: [name] Other Clinician: Referring Provider: Treating Provider/Extender:Robson, Tenna Child, [place] Siren in Treatment: 3 Verbal / Phone Orders: No Diagnosis Coding ICD-10 Coding Code Description I89.0 Lymphedema, not elsewhere classified L97.221 Non-pressure chronic ulcer of left calf limited to breakdown of skin L97.211 Non-pressure chronic ulcer of right calf limited to breakdown of skin E11.622 Type 2 diabetes mellitus with other skin ulcer Follow-up Appointments Return Appointment in 1 week. Dressing Change Frequency Do not change entire dressing for one week. Skin Barriers/Peri-Wound Care Moisturizing lotion TCA Cream or Ointment Wound Cleansing May shower and wash wound with soap and water. Primary Wound Dressing Wound #28 Right,Lateral Lower Leg Hydrofera Blue - ready Wound #29 Right,Posterior Lower Leg Hydrofera Blue - ready Wound #30 Right,Distal,Lateral Lower Leg Hydrofera Blue - ready Wound #31 Right,Distal,Posterior Lower Leg Hydrofera Blue -  ready Wound #32 Left,Anterior Lower Leg Hydrofera Blue - ready Wound #33 Left,Proximal,Lateral Lower Leg Hydrofera Blue - ready Wound #34 Left,Distal,Lateral Lower Leg Hydrofera Blue - ready Wound #35 Left,Distal,Anterior Lower Leg Hydrofera Blue - ready Secondary Dressing Wound #28 Right,Lateral Lower Leg ABD pad Wound #29 Right,Posterior Lower Leg ABD pad Wound #30 Right,Distal,Lateral Lower Leg ABD pad Wound #31 Right,Distal,Posterior Lower Leg ABD pad Wound #32 Left,Anterior Lower Leg ABD pad Wound #33 Left,Proximal,Lateral Lower Leg ABD pad Wound #34 Left,Distal,Lateral Lower Leg ABD pad Wound #35 Left,Distal,Anterior Lower Leg ABD pad Edema Control 4 layer compression - Bilateral Electronic Signature(s) Signed: 01/31/2019 6:05:35 PM By: Linton Ham MD Signed: 03/14/2019 3:01:15 PM By: Carlene Coria RN Entered By: Carlene Coria on 01/31/2019 09:18:00 -------------------------------------------------------------------------------- Problem List Details Patient Name: Date of Service: AIRAM, SILBERMAN 01/31/2019 8:15 AM Medical Record [id number] Patient Account Number: [id number] Date of Birth/Sex: Treating RN: 02/15/1946 (73 y.o. [name] Primary Care Provider: [name] Other Clinician: Referring Provider: Treating Provider/Extender:Robson, Tenna Child, [place] Siren in Treatment: 3 Active Problems ICD-10 Evaluated Encounter  Code Description Active Date Today Diagnosis I89.0 Lymphedema, not elsewhere classified 01/10/2019 No Yes L97.221 Non-pressure chronic ulcer of left calf limited to 01/10/2019 No Yes breakdown of skin L97.211 Non-pressure chronic ulcer of right calf limited to 01/10/2019 No Yes breakdown of skin E11.622 Type 2 diabetes mellitus with other skin ulcer 01/10/2019 No Yes Inactive Problems Resolved Problems Electronic Signature(s) Signed: 01/31/2019 6:05:35 PM By: Linton Ham MD Entered By: Linton Ham on  01/31/2019 09:43:52 -------------------------------------------------------------------------------- Progress Note Details Patient Name: Date of Service: DEYANIRA, LINS 01/31/2019 8:15 AM Medical Record [id number] Patient Account Number: [id number] Date of Birth/Sex: Treating RN: July 31, 1945 (73 y.o. [name] Primary Care Provider: [name] Other Clinician: Referring Provider: Treating Provider/Extender:Robson, Tenna Child, [place] Siren in Treatment: 3 Subjective History of Present Illness (HPI) 04/09/15; the patient is here for review of wounds on her bilateral lower extremities for the last month. The patient has a history of lymphedema and bilateral chronic venous insufficiency with inflammation she was here for review of wounds on her bilateral legs in 2014 she was discharged home with external compression pumps which she is currently not using. Over the last month she developed wounds on her bilateral lower legs with drainage and pain and she is here for our review of this. 04/16/15; the patient's edema is under much better control. She has 3 areas one on the right anterior medial leg one on the left posterior leg and a small open area with purulent drainage on the left anterior leg. I have cultured the area on the left anterior leg 04/23/15; continued improvement in the patient's edema. All her wounds of closed I see no open areas. Culture from the left anterior leg last time was negative. READMISSION 02/07/16; this patient is a woman that I saw her earlier in 2017. I believe we discharged her very quickly with bilateral wounds in her legs secondary to chronic venous hypertension with inflammation and secondary lymphedema. She had juxtalite stockings. She has external compression pumps at home from a stay in our clinic in 2014. She tells me that she's had wounds on her bilateral lower legs since March or April of this year. 3 wounds on the left 2 on the  right. She is a type II diabetic and has a history of noncompressible vessels bilaterally although she is tolerated 4-layer compression. The last note from her primary physician Dr. [name] was in May. At that point Dr. [name] had referred the patient here for lymphedema management. The patient stated she made a phone call to year but was not able to arrange an appointment. It is not clear that she is actually using anything on the wounds currently except some gauze that was saturated with drainage per our intake nurse. She is certainly not using her juxtalite stockings and by the patient's own admission she is not using her compression pumps because they are "irritating". I don't see a recent hemoglobin A1c. The patient states she has not been systemically unwell but the legs are painful 02/14/16; patient tolerated her Profore wraps reasonably well. She is using the compression pumps 1 time per day. Measurements of her legs are better in terms of the amount of lymphedema 02/21/16; patient tolerated her Profore wraps it. She says she is using her compression pumps one or 2 times a day. Measurements of her leg circumference are improved and most of her wound dimensions are better, unfortunately the home health that we referred her to did not except her insurance but works successful in  getting her accompanied that would except her insurance however she apparently has a $25 co-pay which she cannot afford as she is on a fixed income 03/05/16; patient states she did not back using the pumps. She is healed that 2 wounds on the left leg. She still has areas on the right lateral lower leg, right medial lower leg and left lateral lower leg. All of these look some better and have reduced in area 03/13/16; still 3 wounds 1 on the left lateral leg, one on the left medial/posterior leg which is really the most extensive and a small area on the right lateral leg. All of this in the setting of  severe chronic lymphedema and chronic venous inflammation. Damage to the skin with cobblestone thickened skin. 03/20/16; still 2 areas. The area on the left lateral leg appears to of closed over. The right medial leg is still most extensive wound although it appears to be closing over right lateral only a small open area remains. Using silver alginate Profore wraps 03/27/16; the area on the left leg remains closed. The edema control is better on the left than the right. The right medial leg is still weeping but everything appears to be epithelialized. The lateral aspect of the right leg appears closed. She tells me she has old juxtalite stockings. She has been compliant with her compression pumps. 04/03/16; the area on the left leg remains closed. She has ordered new juxtalite stockings. The right posterior medial wound is fully epithelialized. However she has an irritated area over the right Achilles that she states is been itching all week under the wraps.. She has been compliant with her compression pumps 04/10/16; the area on the left leg remains closed. The right posterior medial wound is also closed today. The irritated area over the right Achilles which was probably wrap injury is also fully epithelialized and closed. The patient has not received her new juxtalite stockings, she arrives in clinic today with an old juxtalite stocking in place. Sheis using her compression pumps secondary to her severe stage III lymphedema READMISSION 01/21/17; this is a patient we know from several previous stays in this clinic. She has lymphedema with chronic venous insufficiency and inflammation. The last time she was here we discharged her with bilateral juxta lites which she is compliant with. She also has external compression pumps at home from stays in our clinic in 2014 although she tells me that she is not very compliant with these as when she uses them a causes pain in her bilateral knees the patient  states that her wounds on her left greater than right leg including 2 large areas on the left anterior and left lateral leg and an area on the right leg open in late June or July since then she is been followed by [facility] and she's had bilateral Unna boot supplied every week for the last month.. I think the wounds have actually improved although they are not specifically dressed. She states she had an ultrasound to rule out DVT bilaterally that was negative. She does not have arterial studies although she is a diabetic. She states she has a lot of pain in her legs she states she does not use her external compression pumps because it causes pain in her knees. As usual her ABIs were noncompressible bilaterally in this clinic 01/28/17; currently the patient only has one small wound on the left lateral leg and I think this should be healed next week. We are going to try  to get her bilateral extremitease stockings. She tells me that compression pumps heard her posterior rib arthritic knees so she is not been compliant with this 02/04/17; the patient's wounds are totally healed. She has chronic venous insufficiency and secondary lymphedema. She has new extremitease stockings. She also has compression pumps Readmission: 04/21/17 on evaluation today patient appears to be doing about the same as what she was previous when we were evaluating her back in November 2018. At that point she had completely healed. With that being said she has now reopened and states that she wasn't aware that she was supposed to come back. 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 Subsequently she also states that although she has the compression that we got for her previously the extremitease stockings she nonetheless doesn't always wear these and being noncompliant often has things will reopen as far as ulcers on  her lower extremities. No fevers, chills, nausea, or vomiting noted at this time. Patient has no evidence of dementia. She is having some discomfort in regard to the ulcers at this point. 04/28/17 on evaluation today patient appears to be doing significantly better in regard to her wounds. With that being said she is still having significant discomfort and is not really keen on sharp debridement. She does not appear to have any evidence of infection which is good news I do think the Iodoflex is beneficial for her. Of note her ABI's were found in the system to be on the right 1.21 with a TBI of 0.76 and on the left 1.35 with a TBI of 0.75. In general her swelling appears to be significantly improved however. No fevers, chills, nausea, or vomiting noted at this time. Patient has been tolerating the wraps without complication. She has no evidence of dementia. 05/05/17-she is here in follow-up evaluation for bilateral lower extremity venous and lymphedema ulcers. She states she has not been using her lymphedema pumps secondary to pain at her knees. After discussion it was noted that she uses knee-high lymphedema pumps but does have a pair of thigh-high lymphedema pumps. She was strongly advised to use the thigh-high lymphedema pumps and if necessary reduce the setting for improved tolerance. She reports no change in odor and/or drainage. She continues to tolerate sharp debridement poorly with significant pain, primarily to the right lower extremity. We will continue with current treatment plan, along with improved lymphedema pump use and follow-up next week 05/12/17 on evaluation today patient actually appears to be doing much better in regard to her bilateral lower extremity ulcers. Only the right altar actually requires debridement today. The other two cleaned off nicely in two of the three in fact on the left lower extremity or actually almost completely healed. She does have some weeping noted still  the bilateral lower extremities left greater than right. She still is not able to use her compression pumps even though I have mentioned this several times to her as did Leah last week. No fevers, chills, nausea, or vomiting noted at this time. Patient has been tolerating the dressing changes without complication. Patient has no evidence of dementia 05/19/17 on evaluation today patient appears to be doing very well regard to her bilateral lower extremity ulcers. In fact she seems to be improving and in fact healed in a couple of locations that were giving her trouble last week. Overall I'm extremely pleased with how things have progressed.  She still has some discomfort especially in the right lateral lower extremity. Fortunately there does not appear to be any evidence of significant infection which is good news she does tell me that she has used her compression pumps several times although not routinely over the past week I did praise her for this. I do think it will be beneficial and that might even be what's helping with her wounds as far as healing is concerned at least to a degree. No fevers, chills, nausea, or vomiting noted at this time. Patient has no dementia and no evidence of infection. 05/26/17 on evaluation today patient presents for evaluation concerning her bilateral lower extremity ulcer secondary to lymphedema. Fortunately she appears to be doing very well at this point in time. Specifically her right leg appears to be completely closed and healed. The left leg though there is still a small area of opening overall has healed. She does have a little bit of excoriation at the top or the wraps are because they slid down on her. Otherwise there's no evidence of infection and the other has improved dramatically now that she is not draining as significantly. Patient is having no significant pain she tells me she has been able to use her lymphedema pumps one time a day although that's not  all she can tolerate. Even that is difficult for her. 06/02/17 on evaluation today patient's ulcers appear to be completely closed at this point. She does still note some odor but I believe this is due to the fact she has not been able to wash her legs as well currently. Fortunately she did bring her compression with her today. She does have the EXTREMIT-EASE Compression. With that being said she is somewhat nervous about going back to this as previously she broke out yet again once we discontinue wrapping her. READMISSION 11/23/17; patient is now 73 year old diabetic woman with severe chronic venous insufficiency with lymphedema. We care for her last in this clinic in February 2019 at which time she had chronic venous insufficiency with lymphedema bilateral lower extremity ulcers. We discharged her with bilateral wrap around/extremitease stockings and compression pumps. Is fairly clear she is not using the compression pumps stating it causes pain in her knees. In any case she was admitted to hospital from 8/3 through 8/6 with a several week history of recurrent bilateral ulcers. She was felt to have bilateral cellulitis treated with doxycycline IV. She is still on oral doxycycline and has another 2 days worth. At discharge her white count was 14.1 hemoglobin at 8.9. The patient is a diabetic however her last ABIs in 2018 showed a right ABI of 1.21, left of 1.35. TBI's were 0.76 on the right 0.75 on the left. Posterior tibial artery was monophasic on the right triphasic on the left. Dorsalis pedis triphasic on the right and triphasic on the left. She is not felt to have significant macrovascular disease 12/02/17;significant bilateral lower extremity wounds secondary to severe uncontrolled lymphedema. She did not get home health out to change the wraps. So she kept this on all week. She is telling me she using his her compression pumps once a week. In general all the wounds look better. We've been  using silver alginate with secondary absorptive dressings 4 layer compression 12/10/17; bilateral lower extremity wounds secondary to uncontrolled lymphedema. She tells me she is using her compression pumps on most days. We've been keeping wraps on all week. Using silver alginate. She has wounds on the right lateral calf left  medial and left lateral calf. All of this is somewhat better 12/16/17; the patient has 3 small open areas. One on the right lateral calf, one on the left medial calf and the small area on the left lateral calf. She has lymphedema, chronic venous insufficiency with hemosiderin deposition. Very fibrotic distal lower extremity scan with suggestion of an inverted bottle sign appearance to her legs. She has compression pumps but she does not use them has not used them in the last week. Currently we have her in 4 layer compression, silver alginate to the wounds and this is being changed once by home health. She is noncompliant with the compression pumps because she says it hurts her knees. She also has compression stockings ando Juxta light stockings at home 12/24/17; the patient's right lateral calf is healed as well as the left medial. There is no open wound on the right leg. She still has 2 areas on the left lateral calf. We transitioned the right leg into an extremitease stocking which she brought in from home. 12/31/17; the patient has a superficial wound on the left lateral lower calf. We've been wrapping this leg. She arrives today with the right leg swollen but without a wound. She cannot get her extremities stocking on the right leg. There is a lot more edema here. She is not using her compression pumps 01/10/2018; patient has a same superficial wound on the left lateral calf. Her right leg has less peripheral edema. She tells me she also started herself on some Lasix that had been previously prescribed for her at some point but she had never taken it. She is also concerned  about whether her compression pumps are leaking. She has extremitease stockings however the edema gets bad enough she can get these on. Currently she has no open wound on the right leg and is mention the edema is actually better than when I saw this 10 days ago 01/17/2018; patient has a same superficial area on the posterior left calf and a weeping area in the mid part of the right lateral calf. She still does not have anyone from [facility] coming to see her compression pumps which she describes as being suspicious for a leakage. I am not really sure how frequently she is using this in any case. She has massive bilateral calf lymphedema. I think there is some spreading lymphedema into her posterior thighs. 01/24/2018; the patient's edema in her legs is actually some better. She still has a smaller area on the left posterior calf although this is better. The weeping area on the right lateral calf is also better. As it turned out she did not get her pumps from [facility] but [facility] and they are going to try to go out and see these this week which is going to help. I explained to her that she will use the compression pumps once a day while we are wrapping her legs but when she transitions to stockings that may be she will require pumping twice a day. Her compliance in this area has not been high and I wonder about her ability to do this herself. 02/01/2018; the area on the posterior left leg is healed. She still has the original wound on the right lateral calf which is epithelialized. The however just above this there is still weeping lymphedema fluid. She has a new area on the upper right calf which is either wrap injury or is she points out where she has been scratching under the  wraps. We still not have managed to get a hold of a representative of [facility] to see if the pump is operational. She has extremitease stockings at home however I am not of the opinion  that this will maintain her skin integrity without the compression pumps. 02/08/2018; posterior left leg remains healed although she has a new small open area on the left lateral calf. Right lateral calf has two quarter sized open areas and close juxtaposition. There is less weeping edema fluid. She apparently managed to contact [facility]. They are sending her a part. But she is still not received it 02/15/2018; the patient has no open wound on either leg. Some scale present on the right upper lateral calf just below the fibular head. Edema control is excellent. She did not bring her stockings or her juxta lite wrap around. She has the part for her compression pump but she has not use the compression pumps yet READMISSION 01/10/2019 Patient is now a 73 year old woman that we have had in the clinic multiple times in the past. She has chronic lower extremity lymphedema, recurrent wounds on her bilateral lower extremities. She is also a type II diabetic reasonably well controlled with a recent hemoglobin A1c while she was in the hospital of 5.9. She has wounds on her bilateral lower extremities mostly on the left lateral and right lateral. These were noted also during her recent hospitalization. The patient states they have been there for a while. She has not been able to wear her stockings he has trouble getting them on as she lives alone. She also has external compression pumps but uses them sparingly because it causes pain in her knees which hurt because of osteoarthritis. The patient was on doxycycline before she went into the hospital apparently for fear of infection in her legs and is on Keflex coming out because of a UTI The patient was recently in the hospital from 01/06/2019 through 01/09/2019. She was admitted with nausea and vomiting and prerenal azotemia. This was corrected with IV fluid her Lasix has been put on hold. Hemoglobin A1c at 5.9 her metformin was discontinued. She was  also felt to have a UTI she was prescribed Keflex at 503 times a day she is picking that up today. The patient is not felt to have an arterial issue. ABIs in our clinic were 1.24 on the right and 1.06 on the left. She had formal studies in 2018 at which time her ABIs were 1.21 on the right TBI of 0.76 and on the left 1.35 and 0.75 respectively. Waveforms are on the right were monophasic and biphasic, triphasic and biphasic on the left. She is tolerated 4 layer compression in the past 10/13. Comes in today having not used her compression pumps /perhaps once in the last week. She has too much edema to consider healing these wounds. We have been using silver alginate 10/20; she tells me he had she used her compression pumps once a day as we asked. Clearly she has edema out of her legs although most of it seems to be settling around her knees which is what she complains about. We have been using silver alginate to the extensive de-epithelialized area on the left lateral calf and a much smaller area on the right lateral extending linearly into the right posterior 10/27; she is using her compression pumps daily. Her edema control is better. We have been using silver alginate to the extensive wound areas on her bilateral lower legs. This includes left  anterior left lateral and left posterior. Right anterior right lateral and right posterior. The wounds are not all in the same condition. She has severe bilateral skin damage with the epithelialization, flaking dried epithelium Objective Constitutional Sitting or standing Blood Pressure is within target range for patient.. Pulse regular and within target range for patient.Marland Kitchen Respirations regular, non-labored and within target range.. Temperature is normal and within the target range for the patient.Marland Kitchen Appears in no distress. Vitals Time Taken: 8:14 AM, Height: 71 in, Weight: 240 lbs, BMI: 33.5, Temperature: 97.7 F, Pulse: 58 bpm, Respiratory Rate: 20  breaths/min, Blood Pressure: 133/61 mmHg. General Notes: Wound exam ooShe has 3 open areas on the left lateral calf with some depth. 100% covered and tightly adherent necrotic debris debrided with an open curette hemostasis with direct pressure there is also a smaller open area on the left posterior. All of these have some depth versus the right side ooOn the right lateral calf a large area of wound with denuded epithelium. Smaller areas anteriorly posteriorly. No debridement was required in any of these areas Integumentary (Hair, Skin) Wound #28 status is Open. Original cause of wound was Gradually Appeared. The wound is located on the Right,Lateral Lower Leg. The wound measures 4.7cm length x 5cm width x 0.1cm depth; 18.457cm^2 area and 1.846cm^3 volume. There is Fat Layer (Subcutaneous Tissue) Exposed exposed. There is no tunneling or undermining noted. There is a medium amount of serosanguineous drainage noted. The wound margin is flat and intact. There is large (67-100%) red granulation within the wound bed. There is a small (1-33%) amount of necrotic tissue within the wound bed including Adherent Slough. General Notes: macerated periwound. Wound #29 status is Open. Original cause of wound was Gradually Appeared. The wound is located on the Right,Posterior Lower Leg. The wound measures 2.5cm length x 2cm width x 0.1cm depth; 3.927cm^2 area and 0.393cm^3 volume. There is Fat Layer (Subcutaneous Tissue) Exposed exposed. There is no tunneling or undermining noted. There is a medium amount of serosanguineous drainage noted. The wound margin is flat and intact. There is large (67-100%) red granulation within the wound bed. There is no necrotic tissue within the wound bed. Wound #30 status is Open. Original cause of wound was Gradually Appeared. The wound is located on the Right,Distal,Lateral Lower Leg. The wound measures 1cm length x 1.1cm width x 0.1cm depth; 0.864cm^2 area and 0.086cm^3  volume. There is Fat Layer (Subcutaneous Tissue) Exposed exposed. There is no tunneling or undermining noted. There is a medium amount of serosanguineous drainage noted. The wound margin is flat and intact. There is large (67-100%) pink, pale granulation within the wound bed. There is a small (1-33%) amount of necrotic tissue within the wound bed including Adherent Slough. Wound #31 status is Open. Original cause of wound was Gradually Appeared. The wound is located on the Right,Distal,Posterior Lower Leg. The wound measures 2cm length x 1.5cm width x 0.1cm depth; 2.356cm^2 area and 0.236cm^3 volume. There is Fat Layer (Subcutaneous Tissue) Exposed exposed. There is no tunneling or undermining noted. There is a medium amount of serosanguineous drainage noted. The wound margin is flat and intact. There is large (67-100%) red granulation within the wound bed. There is no necrotic tissue within the wound bed. Wound #32 status is Open. Original cause of wound was Gradually Appeared. The wound is located on the Left,Anterior Lower Leg. The wound measures 0.5cm length x 0.4cm width x 0.1cm depth; 0.157cm^2 area and 0.016cm^3 volume. There is Fat Layer (Subcutaneous Tissue) Exposed  exposed. There is no tunneling or undermining noted. There is a medium amount of purulent drainage noted. The wound margin is flat and intact. There is no granulation within the wound bed. There is a large (67-100%) amount of necrotic tissue within the wound bed including Adherent Slough. Wound #33 status is Open. Original cause of wound was Gradually Appeared. The wound is located on the Left,Proximal,Lateral Lower Leg. The wound measures 7.5cm length x 13cm width x 0.1cm depth; 76.576cm^2 area and 7.658cm^3 volume. There is Fat Layer (Subcutaneous Tissue) Exposed exposed. There is no tunneling or undermining noted. There is a medium amount of purulent drainage noted. The wound margin is flat and intact. There is small  (1-33%) red, pink granulation within the wound bed. There is a large (67-100%) amount of necrotic tissue within the wound bed including Adherent Slough. General Notes: macerated, weeping periwounds. Wound #34 status is Open. Original cause of wound was Gradually Appeared. The wound is located on the Left,Distal,Lateral Lower Leg. The wound measures 6.5cm length x 7cm width x 0.1cm depth; 35.736cm^2 area and 3.574cm^3 volume. There is Fat Layer (Subcutaneous Tissue) Exposed exposed. There is no tunneling or undermining noted. There is a large amount of purulent drainage noted. The wound margin is flat and intact. There is small (1-33%) red granulation within the wound bed. There is a large (67-100%) amount of necrotic tissue within the wound bed including Adherent Slough. General Notes: macerated, weeping periwound. Wound #35 status is Open. Original cause of wound was Gradually Appeared. The wound is located on the [facility] Lower Leg. The wound measures 0.2cm length x 0.2cm width x 0.1cm depth; 0.031cm^2 area and 0.003cm^3 volume. There is Fat Layer (Subcutaneous Tissue) Exposed exposed. There is no tunneling or undermining noted. There is a medium amount of serosanguineous drainage noted. The wound margin is distinct with the outline attached to the wound base. There is no granulation within the wound bed. There is a large (67- 100%) amount of necrotic tissue within the wound bed including Adherent Slough. Assessment Active Problems ICD-10 Lymphedema, not elsewhere classified Non-pressure chronic ulcer of left calf limited to breakdown of skin Non-pressure chronic ulcer of right calf limited to breakdown of skin Type 2 diabetes mellitus with other skin ulcer Procedures Wound #32 Pre-procedure diagnosis of Wound #32 is a Lymphedema located on the Left,Anterior Lower Leg .Severity of Tissue Pre Debridement is: Fat layer exposed. There was a Excisional Skin/Subcutaneous Tissue  Debridement with a total area of 0.2 sq cm performed by Ricard Dillon., MD. With the following instrument(s): Curette to remove Viable and Non-Viable tissue/material. Material removed includes Subcutaneous Tissue and Slough and after achieving pain control using Lidocaine 5% topical ointment. No specimens were taken. A time out was conducted at 09:05, prior to the start of the procedure. A Moderate amount of bleeding was controlled with Pressure. The procedure was tolerated well with a pain level of 0 throughout and a pain level of 0 following the procedure. Post Debridement Measurements: 0.5cm length x 0.4cm width x 0.1cm depth; 0.016cm^3 volume. Character of Wound/Ulcer Post Debridement is improved. Severity of Tissue Post Debridement is: Fat layer exposed. Post procedure Diagnosis Wound #32: Same as Pre-Procedure Pre-procedure diagnosis of Wound #32 is a Lymphedema located on the Left,Anterior Lower Leg . There was a Four Layer Compression Therapy Procedure by Carlene Coria, RN. Post procedure Diagnosis Wound #32: Same as Pre-Procedure Wound #33 Pre-procedure diagnosis of Wound #33 is a Lymphedema located on the Left,Proximal,Lateral Lower Leg .Severity of Tissue  Pre Debridement is: Fat layer exposed. There was a Excisional Skin/Subcutaneous Tissue Debridement with a total area of 9 sq cm performed by Ricard Dillon., MD. With the following instrument(s): Curette to remove Viable and Non-Viable tissue/material. Material removed includes Subcutaneous Tissue and Slough and after achieving pain control using Lidocaine 5% topical ointment. No specimens were taken. A time out was conducted at 09:05, prior to the start of the procedure. A Moderate amount of bleeding was controlled with Pressure. The procedure was tolerated well with a pain level of 0 throughout and a pain level of 0 following the procedure. Post Debridement Measurements: 7.5cm length x 13cm width x 0.1cm depth; 7.658cm^3  volume. Character of Wound/Ulcer Post Debridement is improved. Severity of Tissue Post Debridement is: Fat layer exposed. Post procedure Diagnosis Wound #33: Same as Pre-Procedure Pre-procedure diagnosis of Wound #33 is a Lymphedema located on the Left,Proximal,Lateral Lower Leg . There was a Four Layer Compression Therapy Procedure by Carlene Coria, RN. Post procedure Diagnosis Wound #33: Same as Pre-Procedure Wound #34 Pre-procedure diagnosis of Wound #34 is a Lymphedema located on the Left,Distal,Lateral Lower Leg .Severity of Tissue Pre Debridement is: Fat layer exposed. There was a Excisional Skin/Subcutaneous Tissue Debridement with a total area of 10.5 sq cm performed by Ricard Dillon., MD. With the following instrument(s): Curette to remove Viable and Non-Viable tissue/material. Material removed includes Subcutaneous Tissue and Slough and after achieving pain control using Lidocaine 5% topical ointment. No specimens were taken. A time out was conducted at 09:05, prior to the start of the procedure. A Moderate amount of bleeding was controlled with Pressure. The procedure was tolerated well with a pain level of 0 throughout and a pain level of 0 following the procedure. Post Debridement Measurements: 6.5cm length x 7cm width x 0.1cm depth; 3.574cm^3 volume. Character of Wound/Ulcer Post Debridement is improved. Severity of Tissue Post Debridement is: Fat layer exposed. Post procedure Diagnosis Wound #34: Same as Pre-Procedure Pre-procedure diagnosis of Wound #34 is a Lymphedema located on the Left,Distal,Lateral Lower Leg . There was a Four Layer Compression Therapy Procedure by Carlene Coria, RN. Post procedure Diagnosis Wound #34: Same as Pre-Procedure Wound #28 Pre-procedure diagnosis of Wound #28 is a Lymphedema located on the Right,Lateral Lower Leg . There was a Four Layer Compression Therapy Procedure by Carlene Coria, RN. Post procedure Diagnosis Wound #28: Same as  Pre-Procedure Wound #29 Pre-procedure diagnosis of Wound #29 is a Lymphedema located on the Right,Posterior Lower Leg . There was a Four Layer Compression Therapy Procedure by Carlene Coria, RN. Post procedure Diagnosis Wound #29: Same as Pre-Procedure Wound #30 Pre-procedure diagnosis of Wound #30 is a Lymphedema located on the Right,Distal,Lateral Lower Leg . There was a Four Layer Compression Therapy Procedure by Carlene Coria, RN. Post procedure Diagnosis Wound #30: Same as Pre-Procedure Wound #31 Pre-procedure diagnosis of Wound #31 is a Lymphedema located on the Right,Distal,Posterior Lower Leg . There was a Four Layer Compression Therapy Procedure by Carlene Coria, RN. Post procedure Diagnosis Wound #31: Same as Pre-Procedure Plan Follow-up Appointments: Return Appointment in 1 week. Dressing Change Frequency: Do not change entire dressing for one week. Skin Barriers/Peri-Wound Care: Moisturizing lotion TCA Cream or Ointment Wound Cleansing: May shower and wash wound with soap and water. Primary Wound Dressing: Wound #28 Right,Lateral Lower Leg: Hydrofera Blue - ready Wound #29 Right,Posterior Lower Leg: Hydrofera Blue - ready Wound #30 Right,Distal,Lateral Lower Leg: Hydrofera Blue - ready Wound #31 Right,Distal,Posterior Lower Leg: Hydrofera Blue - ready Wound #32 Left,Anterior  Lower Leg: Hydrofera Blue - ready Wound #33 Left,Proximal,Lateral Lower Leg: Hydrofera Blue - ready Wound #34 Left,Distal,Lateral Lower Leg: Hydrofera Blue - ready Wound #35 Left,Distal,Anterior Lower Leg: Hydrofera Blue - ready Secondary Dressing: Wound #28 Right,Lateral Lower Leg: ABD pad Wound #29 Right,Posterior Lower Leg: ABD pad Wound #30 Right,Distal,Lateral Lower Leg: ABD pad Wound #31 Right,Distal,Posterior Lower Leg: ABD pad Wound #32 Left,Anterior Lower Leg: ABD pad Wound #33 Left,Proximal,Lateral Lower Leg: ABD pad Wound #34 Left,Distal,Lateral Lower Leg: ABD pad Wound  #35 Left,Distal,Anterior Lower Leg: ABD pad Edema Control: 4 layer compression - Bilateral 1. Change her primary dressings to [facility] on all the wound areas 2. Still using moisture and TCA to her bilateral lower extremities 3. Her edema control remains better and I encouraged her to continue to use the pumps Electronic Signature(s) Signed: 01/31/2019 9:52:38 AM By: Linton Ham MD Entered By: Linton Ham on 01/31/2019 09:52:38 -------------------------------------------------------------------------------- SuperBill Details Patient Name: Date of Service: SETA, MATSUBARA 01/31/2019 Medical Record [id number] Patient Account Number: [id number] Date of Birth/Sex: Treating RN: 1945-11-21 (73 y.o. [place]ille Lo, [place] Primary Care Provider: [name] Other Clinician: Referring Provider: Treating Provider/Extender:Robson, Tenna Child, [place] in Treatment: 3 Diagnosis Coding ICD-10 Codes Code Description I89.0 Lymphedema, not elsewhere classified L97.221 Non-pressure chronic ulcer of left calf limited to breakdown of skin L97.211 Non-pressure chronic ulcer of right calf limited to breakdown of skin E11.622 Type 2 diabetes mellitus with other skin ulcer Facility Procedures CPT4 Code Description: [id number] - DEB SUBQ TISSUE 20 SQ CM/< ICD-10 Diagnosis Description L97.221 Non-pressure chronic ulcer of left calf limited to breakd Modifier: own of skin Quantity: 1 Physician Procedures CPT4 Code Description: [id number] - WC PHYS SUBQ TISS 20 SQ CM ICD-10 Diagnosis Description L97.221 Non-pressure chronic ulcer of left calf limited to breakd Modifier: own of skin Quantity: 1 Electronic Signature(s) Signed: 01/31/2019 6:05:35 PM By: Linton Ham MD Entered By: Linton Ham on 01/31/2019 09:52:53

## 2019-03-14 NOTE — Progress Notes (Signed)
[name] [name], [name] [name] ([id number]) Visit Report for 01/17/2019 Debridement Details Patient Name: Date of Service: [name] [name], [name] [name] 01/17/2019 8:45 AM Medical Record [id number] Patient Account Number: [id number] Date of Birth/Sex: Treating RN: 06-23-1945 (72 y.o. [name] Primary Care Provider: [name] Other Clinician: Referring Provider: Treating Provider/Extender:Robson, Tenna Child, [place] Siren in Treatment: 1 Debridement Performed for Wound #30 Right,Distal,Lateral Lower Leg Assessment: Performed By: Physician Ricard Dillon., MD Debridement Type: Debridement Severity of Tissue Pre Fat layer exposed Debridement: Level of Consciousness (Pre- Awake and Alert procedure): Pre-procedure Verification/Time Out Taken: Yes - 09:25 Start Time: 09:25 Pain Control: Lidocaine 5% topical ointment Total Area Debrided (L x W): 1 (cm) x 1.7 (cm) = 1.7 (cm) Tissue and other material Viable, Non-Viable, Slough, Subcutaneous, Slough debrided: Level: Skin/Subcutaneous Tissue Debridement Description: Excisional Instrument: Curette Bleeding: Moderate Hemostasis Achieved: Pressure End Time: 09:28 Procedural Pain: 5 Post Procedural Pain: 0 Response to Treatment: Procedure was tolerated well Level of Consciousness Awake and Alert (Post-procedure): Post Debridement Measurements of Total Wound Length: (cm) 1 Width: (cm) 1.7 Depth: (cm) 0.1 Volume: (cm) 0.134 Character of Wound/Ulcer Post Improved Debridement: Severity of Tissue Post Debridement: Fat layer exposed Post Procedure Diagnosis Same as Pre-procedure Electronic Signature(s) Signed: 01/17/2019 5:42:33 PM By: Linton Ham MD Signed: 03/14/2019 3:01:15 PM By: Carlene Coria RN Entered By: Linton Ham on 01/17/2019 09:57:47 -------------------------------------------------------------------------------- Debridement Details Patient Name: Date of Service: [name] [name], [name] [name] 01/17/2019 8:45  AM Medical Record [id number] Patient Account Number: [id number] Date of Birth/Sex: Treating RN: [birth year] (73 y.o. [name] Primary Care Provider: [name] Other Clinician: Referring Provider: Treating Provider/Extender:Robson, Tenna Child, [place] Siren in Treatment: 1 Debridement Performed for Wound #32 Left,Anterior Lower Leg Assessment: Performed By: Physician Ricard Dillon., MD Debridement Type: Debridement Severity of Tissue Pre Fat layer exposed Debridement: Level of Consciousness (Pre- Awake and Alert procedure): Pre-procedure Yes - 09:25 Verification/Time Out Taken: Start Time: 09:25 Pain Control: Lidocaine 5% topical ointment Total Area Debrided (L x W): 1.5 (cm) x 1.7 (cm) = 2.55 (cm) Tissue and other material Viable, Non-Viable, Slough, Subcutaneous, Slough debrided: Level: Skin/Subcutaneous Tissue Debridement Description: Excisional Instrument: Curette Bleeding: Moderate Hemostasis Achieved: Pressure End Time: 09:28 Procedural Pain: 5 Post Procedural Pain: 0 Response to Treatment: Procedure was tolerated well Level of Consciousness Awake and Alert (Post-procedure): Post Debridement Measurements of Total Wound Length: (cm) 1.5 Width: (cm) 1.7 Depth: (cm) 0.1 Volume: (cm) 0.2 Character of Wound/Ulcer Post Improved Debridement: Severity of Tissue Post Debridement: Fat layer exposed Post Procedure Diagnosis Same as Pre-procedure Electronic Signature(s) Signed: 01/17/2019 5:42:33 PM By: Linton Ham MD Signed: 03/14/2019 3:01:15 PM By: Carlene Coria RN Entered By: Linton Ham on 01/17/2019 09:58:03 -------------------------------------------------------------------------------- Debridement Details Patient Name: Date of Service: [name] [name], [name] [name] 01/17/2019 8:45 AM Medical Record [id number] Patient Account Number: [id number] Date of Birth/Sex: Treating RN: October 04, 1945 (73 y.o. [name] Primary Care  Provider: [name] Other Clinician: Referring Provider: Treating Provider/Extender:Robson, Tenna Child, [place] Siren in Treatment: 1 Debridement Performed for Wound #33 Left,Proximal,Lateral Lower Leg Assessment: Performed By: Physician Ricard Dillon., MD Debridement Type: Debridement Severity of Tissue Pre Fat layer exposed Debridement: Level of Consciousness (Pre- Awake and Alert procedure): Pre-procedure Verification/Time Out Taken: Yes - 09:25 Start Time: 09:25 Pain Control: Lidocaine 5% topical ointment Total Area Debrided (L x W): 3.5 (cm) x 10 (cm) = 35 (cm) Tissue and other material Viable, Non-Viable, Slough, Subcutaneous, Slough debrided: Level: Skin/Subcutaneous Tissue Debridement Description: Excisional Instrument: Curette Bleeding: Moderate Hemostasis Achieved: Pressure End Time: 09:28 Procedural Pain: 5 Post  Procedural Pain: 0 Response to Treatment: Procedure was tolerated well Level of Consciousness Awake and Alert (Post-procedure): Post Debridement Measurements of Total Wound Length: (cm) 3.5 Width: (cm) 10 Depth: (cm) 0.1 Volume: (cm) 2.749 Character of Wound/Ulcer Post Improved Debridement: Severity of Tissue Post Debridement: Fat layer exposed Post Procedure Diagnosis Same as Pre-procedure Electronic Signature(s) Signed: 01/17/2019 5:42:33 PM By: Linton Ham MD Signed: 03/14/2019 3:01:15 PM By: Carlene Coria RN Entered By: Linton Ham on 01/17/2019 09:58:15 -------------------------------------------------------------------------------- Debridement Details Patient Name: Date of Service: [name] [name], [name] [name] 01/17/2019 8:45 AM Medical Record [id number] Patient Account Number: [id number] Date of Birth/Sex: Treating RN: 1945-09-07 (73 y.o. [name] Primary Care Provider: [name] Other Clinician: Referring Provider: Treating Provider/Extender:Robson, Tenna Child, [place] Siren in  Treatment: 1 Debridement Performed for Wound #34 Left,Distal,Lateral Lower Leg Assessment: Performed By: Physician Ricard Dillon., MD Debridement Type: Debridement Severity of Tissue Pre Fat layer exposed Debridement: Level of Consciousness (Pre- Awake and Alert procedure): Pre-procedure Verification/Time Out Taken: Yes - 09:25 Start Time: 09:25 Pain Control: Lidocaine 5% topical ointment Total Area Debrided (L x W): 1.7 (cm) x 2 (cm) = 3.4 (cm) Tissue and other material Viable, Non-Viable, Slough, Subcutaneous, Slough debrided: Level: Skin/Subcutaneous Tissue Debridement Description: Excisional Instrument: Curette Bleeding: Moderate Hemostasis Achieved: Pressure End Time: 09:28 Procedural Pain: 5 Post Procedural Pain: 0 Response to Treatment: Procedure was tolerated well Level of Consciousness Awake and Alert (Post-procedure): Post Debridement Measurements of Total Wound Length: (cm) 1.7 Width: (cm) 2 Depth: (cm) 0.1 Volume: (cm) 0.267 Character of Wound/Ulcer Post Improved Debridement: Severity of Tissue Post Debridement: Fat layer exposed Post Procedure Diagnosis Same as Pre-procedure Electronic Signature(s) Signed: 01/17/2019 5:42:33 PM By: Linton Ham MD Signed: 03/14/2019 3:01:15 PM By: Carlene Coria RN Entered By: Linton Ham on 01/17/2019 09:58:25 -------------------------------------------------------------------------------- HPI Details Patient Name: Date of Service: [name] [name], [name] [name] 01/17/2019 8:45 AM Medical Record [id number] Patient Account Number: [id number] Date of Birth/Sex: Treating RN: 10/07/1945 (73 y.o. [name] Primary Care Provider: [name] Other Clinician: Referring Provider: Treating Provider/Extender:Robson, Tenna Child, [place] Siren in Treatment: 1 History of Present Illness HPI Description: 04/09/15; the patient is here for review of wounds on her bilateral lower extremities for the  last month. The patient has a history of lymphedema and bilateral chronic venous insufficiency with inflammation she was here for review of wounds on her bilateral legs in 2014 she was discharged home with external compression pumps which she is currently not using. Over the last month she developed wounds on her bilateral lower legs with drainage and pain and she is here for our review of this. 04/16/15; the patient's edema is under much better control. She has 3 areas one on the right anterior medial leg one on the left posterior leg and a small open area with purulent drainage on the left anterior leg. I have cultured the area on the left anterior leg 04/23/15; continued improvement in the patient's edema. All her wounds of closed I see no open areas. Culture from the left anterior leg last time was negative. READMISSION 02/07/16; this patient is a woman that I saw her earlier in 2017. I believe we discharged her very quickly with bilateral wounds in her legs secondary to chronic venous hypertension with inflammation and secondary lymphedema. She had juxtalite stockings. She has external compression pumps at home from a stay in our clinic in 2014. She tells me that she's had wounds on her bilateral lower legs since March or April of this year. 3 wounds on the  left 2 on the right. She is a type II diabetic and has a history of noncompressible vessels bilaterally although she is tolerated 4-layer compression. The last note from her primary physician Dr. [name] was in May. At that point Dr. [name] had referred the patient here for lymphedema management. The patient stated she made a phone call to year but was not able to arrange an appointment. It is not clear that she is actually using anything on the wounds currently except some gauze that was saturated with drainage per our intake nurse. She is certainly not using her juxtalite stockings and by the patient's own admission she is not  using her compression pumps because they are "irritating". I don't see a recent hemoglobin A1c. The patient states she has not been systemically unwell but the legs are painful 02/14/16; patient tolerated her Profore wraps reasonably well. She is using the compression pumps 1 time per day. Measurements of her legs are better in terms of the amount of lymphedema 02/21/16; patient tolerated her Profore wraps it. She says she is using her compression pumps one or 2 times a day. Measurements of her leg circumference are improved and most of her wound dimensions are better, unfortunately the home health that we referred her to did not except her insurance but works successful in getting her accompanied that would except her insurance however she apparently has a $25 co-pay which she cannot afford as she is on a fixed income 03/05/16; patient states she did not back using the pumps. She is healed that 2 wounds on the left leg. She still has areas on the right lateral lower leg, right medial lower leg and left lateral lower leg. All of these look some better and have reduced in area 03/13/16; still 3 wounds 1 on the left lateral leg, one on the left medial/posterior leg which is really the most extensive and a small area on the right lateral leg. All of this in the setting of severe chronic lymphedema and chronic venous inflammation. Damage to the skin with cobblestone thickened skin. 03/20/16; still 2 areas. The area on the left lateral leg appears to of closed over. The right medial leg is still most extensive wound although it appears to be closing over right lateral only a small open area remains. Using silver alginate Profore wraps 03/27/16; the area on the left leg remains closed. The edema control is better on the left than the right. The right medial leg is still weeping but everything appears to be epithelialized. The lateral aspect of the right leg appears closed. She tells me she has old  juxtalite stockings. She has been compliant with her compression pumps. 04/03/16; the area on the left leg remains closed. She has ordered new juxtalite stockings. The right posterior medial wound is fully epithelialized. However she has an irritated area over the right Achilles that she states is been itching all week under the wraps.. She has been compliant with her compression pumps 04/10/16; the area on the left leg remains closed. The right posterior medial wound is also closed today. The irritated area over the right Achilles which was probably wrap injury is also fully epithelialized and closed. The patient has not received her new juxtalite stockings, she arrives in clinic today with an old juxtalite stocking in place. Sheis using her compression pumps secondary to her severe stage III lymphedema READMISSION 01/21/17; this is a patient we know from several previous stays in this clinic. She has lymphedema with  chronic venous insufficiency and inflammation. The last time she was here we discharged her with bilateral juxta lites which she is compliant with. She also has external compression pumps at home from stays in our clinic in 2014 although she tells me that she is not very compliant with these as when she uses them a causes pain in her bilateral knees the patient states that her wounds on her left greater than right leg including 2 large areas on the left anterior and left lateral leg and an area on the right leg open in late June or July since then she is been followed by [facility] and she's had bilateral Unna boot supplied every week for the last month.. I think the wounds have actually improved although they are not specifically dressed. She states she had an ultrasound to rule out DVT bilaterally that was negative. She does not have arterial studies although she is a diabetic. She states she has a lot of pain in her legs she states she does not use her external compression pumps  because it causes pain in her knees. As usual her ABIs were noncompressible bilaterally in this clinic 01/28/17; currently the patient only has one small wound on the left lateral leg and I think this should be healed next week. We are going to try to get her bilateral extremitease stockings. She tells me that compression pumps heard her posterior rib arthritic knees so she is not been compliant with this 02/04/17; the patient's wounds are totally healed. She has chronic venous insufficiency and secondary lymphedema. She has new extremitease stockings. She also has compression pumps Readmission: 04/21/17 on evaluation today patient appears to be doing about the same as what she was previous when we were evaluating her back in November 2018. At that point she had completely healed. With that being said she has now reopened and states that she wasn't aware that she was supposed to come back. 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 Subsequently she also states that although she has the compression that we got for her previously the extremitease stockings she nonetheless doesn't always wear these and being noncompliant often has things will reopen as far as ulcers on her lower extremities. No fevers, chills, nausea, or vomiting noted at this time. Patient has no evidence of dementia. She is having some discomfort in regard to the ulcers at this point. 04/28/17 on evaluation today patient appears to be doing significantly better in regard to her wounds. With that being said she is still having significant discomfort and is not really keen on sharp debridement. She does not appear to have any evidence of infection which is good news I do think the Iodoflex is beneficial for her. Of note her ABI's were found in the system to be on the right 1.21 with a TBI of 0.76 and on the left 1.35 with a TBI  of 0.75. In general her swelling appears to be significantly improved however. No fevers, chills, nausea, or vomiting noted at this time. Patient has been tolerating the wraps without complication. She has no evidence of dementia. 05/05/17-she is here in follow-up evaluation for bilateral lower extremity venous and lymphedema ulcers. She states she has not been using her lymphedema pumps secondary to pain at her knees. After discussion it was noted that she uses knee-high lymphedema pumps but does have a pair of thigh-high lymphedema pumps.  She was strongly advised to use the thigh-high lymphedema pumps and if necessary reduce the setting for improved tolerance. She reports no change in odor and/or drainage. She continues to tolerate sharp debridement poorly with significant pain, primarily to the right lower extremity. We will continue with current treatment plan, along with improved lymphedema pump use and follow-up next week 05/12/17 on evaluation today patient actually appears to be doing much better in regard to her bilateral lower extremity ulcers. Only the right altar actually requires debridement today. The other two cleaned off nicely in two of the three in fact on the left lower extremity or actually almost completely healed. She does have some weeping noted still the bilateral lower extremities left greater than right. She still is not able to use her compression pumps even though I have mentioned this several times to her as did Leah last week. No fevers, chills, nausea, or vomiting noted at this time. Patient has been tolerating the dressing changes without complication. Patient has no evidence of dementia 05/19/17 on evaluation today patient appears to be doing very well regard to her bilateral lower extremity ulcers. In fact she seems to be improving and in fact healed in a couple of locations that were giving her trouble last week. Overall I'm extremely pleased with how things have  progressed. She still has some discomfort especially in the right lateral lower extremity. Fortunately there does not appear to be any evidence of significant infection which is good news she does tell me that she has used her compression pumps several times although not routinely over the past week I did praise her for this. I do think it will be beneficial and that might even be what's helping with her wounds as far as healing is concerned at least to a degree. No fevers, chills, nausea, or vomiting noted at this time. Patient has no dementia and no evidence of infection. 05/26/17 on evaluation today patient presents for evaluation concerning her bilateral lower extremity ulcer secondary to lymphedema. Fortunately she appears to be doing very well at this point in time. Specifically her right leg appears to be completely closed and healed. The left leg though there is still a small area of opening overall has healed. She does have a little bit of excoriation at the top or the wraps are because they slid down on her. Otherwise there's no evidence of infection and the other has improved dramatically now that she is not draining as significantly. Patient is having no significant pain she tells me she has been able to use her lymphedema pumps one time a day although that's not all she can tolerate. Even that is difficult for her. 06/02/17 on evaluation today patient's ulcers appear to be completely closed at this point. She does still note some odor but I believe this is due to the fact she has not been able to wash her legs as well currently. Fortunately she did bring her compression with her today. She does have the EXTREMIT-EASE Compression. With that being said she is somewhat nervous about going back to this as previously she broke out yet again once we discontinue wrapping her. READMISSION 11/23/17; patient is now 73 year old diabetic woman with severe chronic venous insufficiency with  lymphedema. We care for her last in this clinic in February 2019 at which time she had chronic venous insufficiency with lymphedema bilateral lower extremity ulcers. We discharged her with bilateral wrap around/extremitease stockings and compression pumps. Is fairly clear she is not using  the compression pumps stating it causes pain in her knees. In any case she was admitted to hospital from 8/3 through 8/6 with a several week history of recurrent bilateral ulcers. She was felt to have bilateral cellulitis treated with doxycycline IV. She is still on oral doxycycline and has another 2 days worth. At discharge her white count was 14.1 hemoglobin at 8.9. The patient is a diabetic however her last ABIs in 2018 showed a right ABI of 1.21, left of 1.35. TBI's were 0.76 on the right 0.75 on the left. Posterior tibial artery was monophasic on the right triphasic on the left. Dorsalis pedis triphasic on the right and triphasic on the left. She is not felt to have significant macrovascular disease 12/02/17;significant bilateral lower extremity wounds secondary to severe uncontrolled lymphedema. She did not get home health out to change the wraps. So she kept this on all week. She is telling me she using his her compression pumps once a week. In general all the wounds look better. We've been using silver alginate with secondary absorptive dressings 4 layer compression 12/10/17; bilateral lower extremity wounds secondary to uncontrolled lymphedema. She tells me she is using her compression pumps on most days. We've been keeping wraps on all week. Using silver alginate. She has wounds on the right lateral calf left medial and left lateral calf. All of this is somewhat better 12/16/17; the patient has 3 small open areas. One on the right lateral calf, one on the left medial calf and the small area on the left lateral calf. She has lymphedema, chronic venous insufficiency with hemosiderin deposition. Very fibrotic  distal lower extremity scan with suggestion of an inverted bottle sign appearance to her legs. She has compression pumps but she does not use them has not used them in the last week. Currently we have her in 4 layer compression, silver alginate to the wounds and this is being changed once by home health. She is noncompliant with the compression pumps because she says it hurts her knees. She also has compression stockings ando Juxta light stockings at home 12/24/17; the patient's right lateral calf is healed as well as the left medial. There is no open wound on the right leg. She still has 2 areas on the left lateral calf. We transitioned the right leg into an extremitease stocking which she brought in from home. 12/31/17; the patient has a superficial wound on the left lateral lower calf. We've been wrapping this leg. She arrives today with the right leg swollen but without a wound. She cannot get her extremities stocking on the right leg. There is a lot more edema here. She is not using her compression pumps 01/10/2018; patient has a same superficial wound on the left lateral calf. Her right leg has less peripheral edema. She tells me she also started herself on some Lasix that had been previously prescribed for her at some point but she had never taken it. She is also concerned about whether her compression pumps are leaking. She has extremitease stockings however the edema gets bad enough she can get these on. Currently she has no open wound on the right leg and is mention the edema is actually better than when I saw this 10 days ago 01/17/2018; patient has a same superficial area on the posterior left calf and a weeping area in the mid part of the right lateral calf. She still does not have anyone from [facility] coming to see her compression pumps which she describes  as being suspicious for a leakage. I am not really sure how frequently she is using this in any case. She has massive  bilateral calf lymphedema. I think there is some spreading lymphedema into her posterior thighs. 01/24/2018; the patient's edema in her legs is actually some better. She still has a smaller area on the left posterior calf although this is better. The weeping area on the right lateral calf is also better. As it turned out she did not get her pumps from [facility] and they are going to try to go out and see these this week which is going to help. I explained to her that she will use the compression pumps once a day while we are wrapping her legs but when she transitions to stockings that may be she will require pumping twice a day. Her compliance in this area has not been high and I wonder about her ability to do this herself. 02/01/2018; the area on the posterior left leg is healed. She still has the original wound on the right lateral calf which is epithelialized. The however just above this there is still weeping lymphedema fluid. She has a new area on the upper right calf which is either wrap injury or is she points out where she has been scratching under the wraps. We still not have managed to get a hold of a representative of [employer] to see if the pump is operational. She has extremitease stockings at home however I am not of the opinion that this will maintain her skin integrity without the compression pumps. 02/08/2018; posterior left leg remains healed although she has a new small open area on the left lateral calf. Right lateral calf has two quarter sized open areas and close juxtaposition. There is less weeping edema fluid. She apparently managed to contact [employer]. They are sending her a part. But she is still not received it 02/15/2018; the patient has no open wound on either leg. Some scale present on the right upper lateral calf just below the fibular head. Edema control is excellent. She did not bring her stockings or her juxta lite  wrap around. She has the part for her compression pump but she has not use the compression pumps yet READMISSION 01/10/2019 Patient is now a 73 year old woman that we have had in the clinic multiple times in the past. She has chronic lower extremity lymphedema, recurrent wounds on her bilateral lower extremities. She is also a type II diabetic reasonably well controlled with a recent hemoglobin A1c while she was in the hospital of 5.9. She has wounds on her bilateral lower extremities mostly on the left lateral and right lateral. These were noted also during her recent hospitalization. The patient states they have been there for a while. She has not been able to wear her stockings he has trouble getting them on as she lives alone. She also has external compression pumps but uses them sparingly because it causes pain in her knees which hurt because of osteoarthritis. The patient was on doxycycline before she went into the hospital apparently for fear of infection in her legs and is on Keflex coming out because of a UTI The patient was recently in the hospital from 01/06/2019 through 01/09/2019. She was admitted with nausea and vomiting and prerenal azotemia. This was corrected with IV fluid her Lasix has been put on hold. Hemoglobin A1c at 5.9 her metformin was discontinued. She was also felt to have a UTI she  was prescribed Keflex at 503 times a day she is picking that up today. The patient is not felt to have an arterial issue. ABIs in our clinic were 1.24 on the right and 1.06 on the left. She had formal studies in 2018 at which time her ABIs were 1.21 on the right TBI of 0.76 and on the left 1.35 and 0.75 respectively. Waveforms are on the right were monophasic and biphasic, triphasic and biphasic on the left. She is tolerated 4 layer compression in the past 10/13. Comes in today having not used her compression pumps /perhaps once in the last week. She has too much edema to consider healing  these wounds. We have been using silver alginate Electronic Signature(s) Signed: 01/17/2019 5:42:33 PM By: Linton Ham MD Entered By: Linton Ham on 01/17/2019 09:59:36 -------------------------------------------------------------------------------- Physical Exam Details Patient Name: Date of Service: [name] [name], [name] [name] 01/17/2019 8:45 AM Medical Record [id number] Patient Account Number: [id number] Date of Birth/Sex: Treating RN: [birth year] (73 y.o. [name] Primary Care Provider: [name] Other Clinician: Referring Provider: Treating Provider/Extender:Robson, Tenna Child, [place] Siren in Treatment: 1 Constitutional Patient is hypertensive.. Pulse regular and within target range for patient.Marland Kitchen Respirations regular, non-labored and within target range.. Temperature is normal and within the target range for the patient.Marland Kitchen Appears in no distress. Notes Wound exam; unfortunately patient does not have good edema control. All of the wounds on the left anterior and left lateral calf have adherent necrotic debris on the surface. As well the linear area on the right lateral calf. All of these debrided with an open curette. This gives [place] some more depth of these wounds perhaps 2 mm. Hemostasis with direct pressure Electronic Signature(s) Signed: 01/17/2019 5:42:33 PM By: Linton Ham MD Entered By: Linton Ham on 01/17/2019 10:00:34 -------------------------------------------------------------------------------- Physician Orders Details Patient Name: Date of Service: [name] [name], [name] [name] 01/17/2019 8:45 AM Medical Record [id number] Patient Account Number: [id number] Date of Birth/Sex: Treating RN: [birth year] (73 y.o. 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 Dressing Change Frequency Do not change entire dressing for one week. Skin Barriers/Peri-Wound Care TCA Cream or Ointment Wound Cleansing May shower and wash wound with soap and water. Primary Wound Dressing Wound #28 Right,Lateral Lower Leg Calcium Alginate with Silver Wound #29 Right,Posterior Lower Leg Calcium Alginate with Silver Wound #30 Right,Distal,Lateral Lower Leg Calcium Alginate with Silver Wound #31 Right,Distal,Posterior Lower Leg Calcium Alginate with Silver Wound #32 Left,Anterior Lower Leg Calcium Alginate with Silver Wound #33 Left,Proximal,Lateral Lower Leg Calcium Alginate with Silver Wound #34 Left,Distal,Lateral Lower Leg Calcium Alginate with Silver Secondary Dressing Wound #28 Right,Lateral Lower Leg ABD pad Wound #29 Right,Posterior Lower Leg ABD pad Wound #30 Right,Distal,Lateral Lower Leg ABD pad Wound #31 Right,Distal,Posterior Lower Leg ABD pad Wound #32 Left,Anterior Lower Leg ABD pad Wound #33 Left,Proximal,Lateral Lower Leg ABD pad Wound #34 Left,Distal,Lateral Lower Leg ABD pad Edema Control Wound #28 Right,Lateral Lower Leg 4 layer compression - Bilateral Wound #29 Right,Posterior Lower Leg 4 layer compression - Bilateral Wound #30 Right,Distal,Lateral Lower Leg 4 layer compression - Bilateral Wound #31 Right,Distal,Posterior Lower Leg 4 layer compression - Bilateral Wound #  32 Left,Anterior Lower Leg 4 layer compression - Bilateral Wound #33 Left,Proximal,Lateral Lower Leg 4 layer compression - Bilateral Wound #34 Left,Distal,Lateral Lower Leg 4 layer compression - Bilateral Electronic  Signature(s) Signed: 01/17/2019 5:42:33 PM By: Linton Ham MD Signed: 03/14/2019 3:01:15 PM By: Carlene Coria RN Entered By: Carlene Coria on 01/17/2019 08:42:19 -------------------------------------------------------------------------------- Problem List Details Patient Name: Date of Service: [name] [name], [name] [name] 01/17/2019 8:45 AM Medical Record [id number] Patient Account Number: [id number] Date of Birth/Sex: Treating RN: 07-27-1945 (73 y.o. Voncille Lo, [place] Primary Care Provider: [name] Other Clinician: Referring Provider: Treating Provider/Extender:Robson, Tenna Child, [place] Siren in Treatment: 1 Active Problems ICD-10 Evaluated Encounter Code Description Active Date Today Diagnosis I89.0 Lymphedema, not elsewhere classified 01/10/2019 No Yes L97.221 Non-pressure chronic ulcer of left calf limited to 01/10/2019 No Yes breakdown of skin L97.211 Non-pressure chronic ulcer of right calf limited to 01/10/2019 No Yes breakdown of skin E11.622 Type 2 diabetes mellitus with other skin ulcer 01/10/2019 No Yes Inactive Problems Resolved Problems Electronic Signature(s) Signed: 01/17/2019 5:42:33 PM By: Linton Ham MD Entered By: Linton Ham on 01/17/2019 09:54:54 -------------------------------------------------------------------------------- Progress Note Details Patient Name: Date of Service: [name] [name], [name] [name] 01/17/2019 8:45 AM Medical Record [id number] Patient Account Number: [id number] Date of Birth/Sex: Treating RN: 1946-01-13 (73 y.o. [name] Primary Care Provider: [name] Other Clinician: Referring Provider: Treating Provider/Extender:Robson, Tenna Child, [place] Siren in Treatment: 1 Subjective History of Present Illness (HPI) 04/09/15; the patient is here for review of wounds on her bilateral lower extremities for the last month. The patient has a history of lymphedema and bilateral chronic venous  insufficiency with inflammation she was here for review of wounds on her bilateral legs in 2014 she was discharged home with external compression pumps which she is currently not using. Over the last month she developed wounds on her bilateral lower legs with drainage and pain and she is here for our review of this. 04/16/15; the patient's edema is under much better control. She has 3 areas one on the right anterior medial leg one on the left posterior leg and a small open area with purulent drainage on the left anterior leg. I have cultured the area on the left anterior leg 04/23/15; continued improvement in the patient's edema. All her wounds of closed I see no open areas. Culture from the left anterior leg last time was negative. READMISSION 02/07/16; this patient is a woman that I saw her earlier in 2017. I believe we discharged her very quickly with bilateral wounds in her legs secondary to chronic venous hypertension with inflammation and secondary lymphedema. She had juxtalite stockings. She has external compression pumps at home from a stay in our clinic in 2014. She tells me that she's had wounds on her bilateral lower legs since March or April of this year. 3 wounds on the left 2 on the right. She is a type II diabetic and has a history of noncompressible vessels bilaterally although she is tolerated 4-layer compression. The last note from her primary physician Dr. [name] was in May. At that point Dr. [name] had referred the patient here for lymphedema management. The patient stated she made a phone call to year but was not able to arrange an appointment. It is not clear that she is actually using anything on the wounds currently except some gauze that was saturated with drainage per our intake nurse. She is certainly not using her juxtalite stockings and by the patient's own admission she is not using her compression pumps because  they are "irritating". I don't see a recent  hemoglobin A1c. The patient states she has not been systemically unwell but the legs are painful 02/14/16; patient tolerated her Profore wraps reasonably well. She is using the compression pumps 1 time per day. Measurements of her legs are better in terms of the amount of lymphedema 02/21/16; patient tolerated her Profore wraps it. She says she is using her compression pumps one or 2 times a day. Measurements of her leg circumference are improved and most of her wound dimensions are better, unfortunately the home health that we referred her to did not except her insurance but works successful in getting her accompanied that would except her insurance however she apparently has a $25 co-pay which she cannot afford as she is on a fixed income 03/05/16; patient states she did not back using the pumps. She is healed that 2 wounds on the left leg. She still has areas on the right lateral lower leg, right medial lower leg and left lateral lower leg. All of these look some better and have reduced in area 03/13/16; still 3 wounds 1 on the left lateral leg, one on the left medial/posterior leg which is really the most extensive and a small area on the right lateral leg. All of this in the setting of severe chronic lymphedema and chronic venous inflammation. Damage to the skin with cobblestone thickened skin. 03/20/16; still 2 areas. The area on the left lateral leg appears to of closed over. The right medial leg is still most extensive wound although it appears to be closing over right lateral only a small open area remains. Using silver alginate Profore wraps 03/27/16; the area on the left leg remains closed. The edema control is better on the left than the right. The right medial leg is still weeping but everything appears to be epithelialized. The lateral aspect of the right leg appears closed. She tells me she has old juxtalite stockings. She has been compliant with her compression pumps. 04/03/16;  the area on the left leg remains closed. She has ordered new juxtalite stockings. The right posterior medial wound is fully epithelialized. However she has an irritated area over the right Achilles that she states is been itching all week under the wraps.. She has been compliant with her compression pumps 04/10/16; the area on the left leg remains closed. The right posterior medial wound is also closed today. The irritated area over the right Achilles which was probably wrap injury is also fully epithelialized and closed. The patient has not received her new juxtalite stockings, she arrives in clinic today with an old juxtalite stocking in place. Sheis using her compression pumps secondary to her severe stage III lymphedema READMISSION 01/21/17; this is a patient we know from several previous stays in this clinic. She has lymphedema with chronic venous insufficiency and inflammation. The last time she was here we discharged her with bilateral juxta lites which she is compliant with. She also has external compression pumps at home from stays in our clinic in 2014 although she tells me that she is not very compliant with these as when she uses them a causes pain in her bilateral knees the patient states that her wounds on her left greater than right leg including 2 large areas on the left anterior and left lateral leg and an area on the right leg open in late June or July since then she is been followed by [facility] and she's had bilateral Unna boot supplied every  week for the last month.. I think the wounds have actually improved although they are not specifically dressed. She states she had an ultrasound to rule out DVT bilaterally that was negative. She does not have arterial studies although she is a diabetic. She states she has a lot of pain in her legs she states she does not use her external compression pumps because it causes pain in her knees. As usual her ABIs were noncompressible  bilaterally in this clinic 01/28/17; currently the patient only has one small wound on the left lateral leg and I think this should be healed next week. We are going to try to get her bilateral extremitease stockings. She tells me that compression pumps heard her posterior rib arthritic knees so she is not been compliant with this 02/04/17; the patient's wounds are totally healed. She has chronic venous insufficiency and secondary lymphedema. She has new extremitease stockings. She also has compression pumps Readmission: 04/21/17 on evaluation today patient appears to be doing about the same as what she was previous when we were evaluating her back in November 2018. At that point she had completely healed. With that being said she has now reopened and states that she wasn't aware that she was supposed to come back. 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 Subsequently she also states that although she has the compression that we got for her previously the extremitease stockings she nonetheless doesn't always wear these and being noncompliant often has things will reopen as far as ulcers on her lower extremities. No fevers, chills, nausea, or vomiting noted at this time. Patient has no evidence of dementia. She is having some discomfort in regard to the ulcers at this point. 04/28/17 on evaluation today patient appears to be doing significantly better in regard to her wounds. With that being said she is still having significant discomfort and is not really keen on sharp debridement. She does not appear to have any evidence of infection which is good news I do think the Iodoflex is beneficial for her. Of note her ABI's were found in the system to be on the right 1.21 with a TBI of 0.76 and on the left 1.35 with a TBI of 0.75. In general her swelling appears to be significantly improved  however. No fevers, chills, nausea, or vomiting noted at this time. Patient has been tolerating the wraps without complication. She has no evidence of dementia. 05/05/17-she is here in follow-up evaluation for bilateral lower extremity venous and lymphedema ulcers. She states she has not been using her lymphedema pumps secondary to pain at her knees. After discussion it was noted that she uses knee-high lymphedema pumps but does have a pair of thigh-high lymphedema pumps. She was strongly advised to use the thigh-high lymphedema pumps and if necessary reduce the setting for improved tolerance. She reports no change in odor and/or drainage. She continues to tolerate sharp debridement poorly with significant pain, primarily to the right lower extremity. We will continue with current treatment plan, along with improved lymphedema pump use and follow-up next week 05/12/17 on evaluation today patient actually appears to be doing much better in regard to her bilateral lower extremity ulcers. Only the right altar actually requires debridement today. The other two cleaned off nicely in two of the three in fact on the left lower extremity or actually almost completely healed. She does have some weeping noted  still the bilateral lower extremities left greater than right. She still is not able to use her compression pumps even though I have mentioned this several times to her as did Leah last week. No fevers, chills, nausea, or vomiting noted at this time. Patient has been tolerating the dressing changes without complication. Patient has no evidence of dementia 05/19/17 on evaluation today patient appears to be doing very well regard to her bilateral lower extremity ulcers. In fact she seems to be improving and in fact healed in a couple of locations that were giving her trouble last week. Overall I'm extremely pleased with how things have progressed. She still has some discomfort especially in the right lateral  lower extremity. Fortunately there does not appear to be any evidence of significant infection which is good news she does tell me that she has used her compression pumps several times although not routinely over the past week I did praise her for this. I do think it will be beneficial and that might even be what's helping with her wounds as far as healing is concerned at least to a degree. No fevers, chills, nausea, or vomiting noted at this time. Patient has no dementia and no evidence of infection. 05/26/17 on evaluation today patient presents for evaluation concerning her bilateral lower extremity ulcer secondary to lymphedema. Fortunately she appears to be doing very well at this point in time. Specifically her right leg appears to be completely closed and healed. The left leg though there is still a small area of opening overall has healed. She does have a little bit of excoriation at the top or the wraps are because they slid down on her. Otherwise there's no evidence of infection and the other has improved dramatically now that she is not draining as significantly. Patient is having no significant pain she tells me she has been able to use her lymphedema pumps one time a day although that's not all she can tolerate. Even that is difficult for her. 06/02/17 on evaluation today patient's ulcers appear to be completely closed at this point. She does still note some odor but I believe this is due to the fact she has not been able to wash her legs as well currently. Fortunately she did bring her compression with her today. She does have the EXTREMIT-EASE Compression. With that being said she is somewhat nervous about going back to this as previously she broke out yet again once we discontinue wrapping her. READMISSION 11/23/17; patient is now 73 year old diabetic woman with severe chronic venous insufficiency with lymphedema. We care for her last in this clinic in February 2019 at which time she  had chronic venous insufficiency with lymphedema bilateral lower extremity ulcers. We discharged her with bilateral wrap around/extremitease stockings and compression pumps. Is fairly clear she is not using the compression pumps stating it causes pain in her knees. In any case she was admitted to hospital from 8/3 through 8/6 with a several week history of recurrent bilateral ulcers. She was felt to have bilateral cellulitis treated with doxycycline IV. She is still on oral doxycycline and has another 2 days worth. At discharge her white count was 14.1 hemoglobin at 8.9. The patient is a diabetic however her last ABIs in 2018 showed a right ABI of 1.21, left of 1.35. TBI's were 0.76 on the right 0.75 on the left. Posterior tibial artery was monophasic on the right triphasic on the left. Dorsalis pedis triphasic on the right and triphasic on the left.  She is not felt to have significant macrovascular disease 12/02/17;significant bilateral lower extremity wounds secondary to severe uncontrolled lymphedema. She did not get home health out to change the wraps. So she kept this on all week. She is telling me she using his her compression pumps once a week. In general all the wounds look better. We've been using silver alginate with secondary absorptive dressings 4 layer compression 12/10/17; bilateral lower extremity wounds secondary to uncontrolled lymphedema. She tells me she is using her compression pumps on most days. We've been keeping wraps on all week. Using silver alginate. She has wounds on the right lateral calf left medial and left lateral calf. All of this is somewhat better 12/16/17; the patient has 3 small open areas. One on the right lateral calf, one on the left medial calf and the small area on the left lateral calf. She has lymphedema, chronic venous insufficiency with hemosiderin deposition. Very fibrotic distal lower extremity scan with suggestion of an inverted bottle sign appearance to  her legs. She has compression pumps but she does not use them has not used them in the last week. Currently we have her in 4 layer compression, silver alginate to the wounds and this is being changed once by home health. She is noncompliant with the compression pumps because she says it hurts her knees. She also has compression stockings ando Juxta light stockings at home 12/24/17; the patient's right lateral calf is healed as well as the left medial. There is no open wound on the right leg. She still has 2 areas on the left lateral calf. We transitioned the right leg into an extremitease stocking which she brought in from home. 12/31/17; the patient has a superficial wound on the left lateral lower calf. We've been wrapping this leg. She arrives today with the right leg swollen but without a wound. She cannot get her extremities stocking on the right leg. There is a lot more edema here. She is not using her compression pumps 01/10/2018; patient has a same superficial wound on the left lateral calf. Her right leg has less peripheral edema. She tells me she also started herself on some Lasix that had been previously prescribed for her at some point but she had never taken it. She is also concerned about whether her compression pumps are leaking. She has extremitease stockings however the edema gets bad enough she can get these on. Currently she has no open wound on the right leg and is mention the edema is actually better than when I saw this 10 days ago 01/17/2018; patient has a same superficial area on the posterior left calf and a weeping area in the mid part of the right lateral calf. She still does not have anyone from [facility] coming to see her compression pumps which she describes as being suspicious for a leakage. I am not really sure how frequently she is using this in any case. She has massive bilateral calf lymphedema. I think there is some spreading lymphedema into her  posterior thighs. 01/24/2018; the patient's edema in her legs is actually some better. She still has a smaller area on the left posterior calf although this is better. The weeping area on the right lateral calf is also better. As it turned out she did not get her pumps from [facility] but [facility] and they are going to try to go out and see these this week which is going to help. I explained to her that she will  use the compression pumps once a day while we are wrapping her legs but when she transitions to stockings that may be she will require pumping twice a day. Her compliance in this area has not been high and I wonder about her ability to do this herself. 02/01/2018; the area on the posterior left leg is healed. She still has the original wound on the right lateral calf which is epithelialized. The however just above this there is still weeping lymphedema fluid. She has a new area on the upper right calf which is either wrap injury or is she points out where she has been scratching under the wraps. We still not have managed to get a hold of a representative of [facility] to see if the pump is operational. She has extremitease stockings at home however I am not of the opinion that this will maintain her skin integrity without the compression pumps. 02/08/2018; posterior left leg remains healed although she has a new small open area on the left lateral calf. Right lateral calf has two quarter sized open areas and close juxtaposition. There is less weeping edema fluid. She apparently managed to contact [facility]. They are sending her a part. But she is still not received it 02/15/2018; the patient has no open wound on either leg. Some scale present on the right upper lateral calf just below the fibular head. Edema control is excellent. She did not bring her stockings or her juxta lite wrap around. She has the part for her compression pump but she has not use the  compression pumps yet READMISSION 01/10/2019 Patient is now a 74 year old woman that we have had in the clinic multiple times in the past. She has chronic lower extremity lymphedema, recurrent wounds on her bilateral lower extremities. She is also a type II diabetic reasonably well controlled with a recent hemoglobin A1c while she was in the hospital of 5.9. She has wounds on her bilateral lower extremities mostly on the left lateral and right lateral. These were noted also during her recent hospitalization. The patient states they have been there for a while. She has not been able to wear her stockings he has trouble getting them on as she lives alone. She also has external compression pumps but uses them sparingly because it causes pain in her knees which hurt because of osteoarthritis. The patient was on doxycycline before she went into the hospital apparently for fear of infection in her legs and is on Keflex coming out because of a UTI The patient was recently in the hospital from 01/06/2019 through 01/09/2019. She was admitted with nausea and vomiting and prerenal azotemia. This was corrected with IV fluid her Lasix has been put on hold. Hemoglobin A1c at 5.9 her metformin was discontinued. She was also felt to have a UTI she was prescribed Keflex at 503 times a day she is picking that up today. The patient is not felt to have an arterial issue. ABIs in our clinic were 1.24 on the right and 1.06 on the left. She had formal studies in 2018 at which time her ABIs were 1.21 on the right TBI of 0.76 and on the left 1.35 and 0.75 respectively. Waveforms are on the right were monophasic and biphasic, triphasic and biphasic on the left. She is tolerated 4 layer compression in the past 10/13. Comes in today having not used her compression pumps /perhaps once in the last week. She has too much edema to consider healing these wounds. We have  been using silver alginate Objective Constitutional Patient  is hypertensive.. Pulse regular and within target range for patient.Marland Kitchen Respirations regular, non-labored and within target range.. Temperature is normal and within the target range for the patient.Marland Kitchen Appears in no distress. Vitals Time Taken: 8:24 AM, Height: 71 in, Weight: 240 lbs, BMI: 33.5, Temperature: 97.7 F, Pulse: 68 bpm, Respiratory Rate: 18 breaths/min, Blood Pressure: 142/64 mmHg. General Notes: Wound exam; unfortunately patient does not have good edema control. All of the wounds on the left anterior and left lateral calf have adherent necrotic debris on the surface. As well the linear area on the right lateral calf. All of these debrided with an open curette. This gives [place] some more depth of these wounds perhaps 2 mm. Hemostasis with direct pressure Integumentary (Hair, Skin) Wound #28 status is Open. Original cause of wound was Gradually Appeared. The wound is located on the Right,Lateral Lower Leg. The wound measures 3cm length x 12.5cm width x 0.1cm depth; 29.452cm^2 area and 2.945cm^3 volume. There is Fat Layer (Subcutaneous Tissue) Exposed exposed. There is no tunneling or undermining noted. There is a medium amount of purulent drainage noted. The wound margin is flat and intact. There is medium (34-66%) red granulation within the wound bed. There is a medium (34-66%) amount of necrotic tissue within the wound bed including Adherent Slough. Wound #29 status is Open. Original cause of wound was Gradually Appeared. The wound is located on the Right,Posterior Lower Leg. The wound measures 2cm length x 2cm width x 0.1cm depth; 3.142cm^2 area and 0.314cm^3 volume. There is Fat Layer (Subcutaneous Tissue) Exposed exposed. There is no tunneling or undermining noted. There is a medium amount of purulent drainage noted. The wound margin is flat and intact. There is large (67-100%) red granulation within the wound bed. There is a small (1-33%) amount of necrotic tissue within the wound bed  including Adherent Slough. Wound #30 status is Open. Original cause of wound was Gradually Appeared. The wound is located on the Right,Distal,Lateral Lower Leg. The wound measures 1cm length x 1.7cm width x 0.1cm depth; 1.335cm^2 area and 0.134cm^3 volume. There is Fat Layer (Subcutaneous Tissue) Exposed exposed. There is no tunneling or undermining noted. There is a medium amount of purulent drainage noted. The wound margin is flat and intact. There is small (1-33%) pink, pale granulation within the wound bed. There is a large (67-100%) amount of necrotic tissue within the wound bed including Adherent Slough. Wound #31 status is Open. Original cause of wound was Gradually Appeared. The wound is located on the Right,Distal,Posterior Lower Leg. The wound measures 2cm length x 1.9cm width x 0.1cm depth; 2.985cm^2 area and 0.298cm^3 volume. There is Fat Layer (Subcutaneous Tissue) Exposed exposed. There is no tunneling or undermining noted. There is a medium amount of purulent drainage noted. The wound margin is flat and intact. There is medium (34-66%) red granulation within the wound bed. There is a medium (34-66%) amount of necrotic tissue within the wound bed including Adherent Slough. Wound #32 status is Open. Original cause of wound was Gradually Appeared. The wound is located on the Left,Anterior Lower Leg. The wound measures 1.5cm length x 1.7cm width x 0.1cm depth; 2.003cm^2 area and 0.2cm^3 volume. There is Fat Layer (Subcutaneous Tissue) Exposed exposed. There is no tunneling or undermining noted. There is a medium amount of purulent drainage noted. The wound margin is flat and intact. There is small (1- 33%) red granulation within the wound bed. There is a large (67-100%) amount of necrotic  tissue within the wound bed including Adherent Slough. Wound #33 status is Open. Original cause of wound was Gradually Appeared. The wound is located on the Left,Proximal,Lateral Lower Leg. The wound  measures 3.5cm length x 10cm width x 0.1cm depth; 27.489cm^2 area and 2.749cm^3 volume. There is Fat Layer (Subcutaneous Tissue) Exposed exposed. There is no tunneling or undermining noted. There is a medium amount of purulent drainage noted. The wound margin is flat and intact. There is medium (34-66%) red, pink granulation within the wound bed. There is a medium (34-66%) amount of necrotic tissue within the wound bed including Adherent Slough. Wound #34 status is Open. Original cause of wound was Gradually Appeared. The wound is located on the Left,Distal,Lateral Lower Leg. The wound measures 1.7cm length x 2cm width x 0.1cm depth; 2.67cm^2 area and 0.267cm^3 volume. There is Fat Layer (Subcutaneous Tissue) Exposed exposed. There is no tunneling or undermining noted. There is a medium amount of purulent drainage noted. The wound margin is flat and intact. There is large (67-100%) red granulation within the wound bed. There is a small (1-33%) amount of necrotic tissue within the wound bed including Adherent Slough. Assessment Active Problems ICD-10 Lymphedema, not elsewhere classified Non-pressure chronic ulcer of left calf limited to breakdown of skin Non-pressure chronic ulcer of right calf limited to breakdown of skin Type 2 diabetes mellitus with other skin ulcer Procedures Wound #30 Pre-procedure diagnosis of Wound #30 is a Lymphedema located on the Right,Distal,Lateral Lower Leg .Severity of Tissue Pre Debridement is: Fat layer exposed. There was a Excisional Skin/Subcutaneous Tissue Debridement with a total area of 1.7 sq cm performed by Ricard Dillon., MD. With the following instrument(s): Curette to remove Viable and Non-Viable tissue/material. Material removed includes Subcutaneous Tissue and Slough and after achieving pain control using Lidocaine 5% topical ointment. No specimens were taken. A time out was conducted at 09:25, prior to the start of the procedure. A Moderate  amount of bleeding was controlled with Pressure. The procedure was tolerated well with a pain level of 5 throughout and a pain level of 0 following the procedure. Post Debridement Measurements: 1cm length x 1.7cm width x 0.1cm depth; 0.134cm^3 volume. Character of Wound/Ulcer Post Debridement is improved. Severity of Tissue Post Debridement is: Fat layer exposed. Post procedure Diagnosis Wound #30: Same as Pre-Procedure Pre-procedure diagnosis of Wound #30 is a Lymphedema located on the Right,Distal,Lateral Lower Leg . There was a Four Layer Compression Therapy Procedure by Carlene Coria, RN. Post procedure Diagnosis Wound #30: Same as Pre-Procedure Wound #32 Pre-procedure diagnosis of Wound #32 is a Lymphedema located on the Left,Anterior Lower Leg .Severity of Tissue Pre Debridement is: Fat layer exposed. There was a Excisional Skin/Subcutaneous Tissue Debridement with a total area of 2.55 sq cm performed by Ricard Dillon., MD. With the following instrument(s): Curette to remove Viable and Non-Viable tissue/material. Material removed includes Subcutaneous Tissue and Slough and after achieving pain control using Lidocaine 5% topical ointment. No specimens were taken. A time out was conducted at 09:25, prior to the start of the procedure. A Moderate amount of bleeding was controlled with Pressure. The procedure was tolerated well with a pain level of 5 throughout and a pain level of 0 following the procedure. Post Debridement Measurements: 1.5cm length x 1.7cm width x 0.1cm depth; 0.2cm^3 volume. Character of Wound/Ulcer Post Debridement is improved. Severity of Tissue Post Debridement is: Fat layer exposed. Post procedure Diagnosis Wound #32: Same as Pre-Procedure Pre-procedure diagnosis of Wound #32 is a Lymphedema located  on the Left,Anterior Lower Leg . There was a Four Layer Compression Therapy Procedure by Carlene Coria, RN. Post procedure Diagnosis Wound #32: Same as  Pre-Procedure Wound #33 Pre-procedure diagnosis of Wound #33 is a Lymphedema located on the Left,Proximal,Lateral Lower Leg .Severity of Tissue Pre Debridement is: Fat layer exposed. There was a Excisional Skin/Subcutaneous Tissue Debridement with a total area of 35 sq cm performed by Ricard Dillon., MD. With the following instrument(s): Curette to remove Viable and Non-Viable tissue/material. Material removed includes Subcutaneous Tissue and Slough and after achieving pain control using Lidocaine 5% topical ointment. No specimens were taken. A time out was conducted at 09:25, prior to the start of the procedure. A Moderate amount of bleeding was controlled with Pressure. The procedure was tolerated well with a pain level of 5 throughout and a pain level of 0 following the procedure. Post Debridement Measurements: 3.5cm length x 10cm width x 0.1cm depth; 2.749cm^3 volume. Character of Wound/Ulcer Post Debridement is improved. Severity of Tissue Post Debridement is: Fat layer exposed. Post procedure Diagnosis Wound #33: Same as Pre-Procedure Pre-procedure diagnosis of Wound #33 is a Lymphedema located on the Left,Proximal,Lateral Lower Leg . There was a Four Layer Compression Therapy Procedure by Carlene Coria, RN. Post procedure Diagnosis Wound #33: Same as Pre-Procedure Wound #34 Pre-procedure diagnosis of Wound #34 is a Lymphedema located on the Left,Distal,Lateral Lower Leg .Severity of Tissue Pre Debridement is: Fat layer exposed. There was a Excisional Skin/Subcutaneous Tissue Debridement with a total area of 3.4 sq cm performed by Ricard Dillon., MD. With the following instrument(s): Curette to remove Viable and Non-Viable tissue/material. Material removed includes Subcutaneous Tissue and Slough and after achieving pain control using Lidocaine 5% topical ointment. No specimens were taken. A time out was conducted at 09:25, prior to the start of the procedure. A Moderate amount of  bleeding was controlled with Pressure. The procedure was tolerated well with a pain level of 5 throughout and a pain level of 0 following the procedure. Post Debridement Measurements: 1.7cm length x 2cm width x 0.1cm depth; 0.267cm^3 volume. Character of Wound/Ulcer Post Debridement is improved. Severity of Tissue Post Debridement is: Fat layer exposed. Post procedure Diagnosis Wound #34: Same as Pre-Procedure Pre-procedure diagnosis of Wound #34 is a Lymphedema located on the Left,Distal,Lateral Lower Leg . There was a Four Layer Compression Therapy Procedure by Carlene Coria, RN. Post procedure Diagnosis Wound #34: Same as Pre-Procedure Wound #28 Pre-procedure diagnosis of Wound #28 is a Lymphedema located on the Right,Lateral Lower Leg . There was a Four Layer Compression Therapy Procedure by Carlene Coria, RN. Post procedure Diagnosis Wound #28: Same as Pre-Procedure Wound #29 Pre-procedure diagnosis of Wound #29 is a Lymphedema located on the Right,Posterior Lower Leg . There was a Four Layer Compression Therapy Procedure by Carlene Coria, RN. Post procedure Diagnosis Wound #29: Same as Pre-Procedure Wound #31 Pre-procedure diagnosis of Wound #31 is a Lymphedema located on the Right,Distal,Posterior Lower Leg . There was a Four Layer Compression Therapy Procedure by Carlene Coria, RN. Post procedure Diagnosis Wound #31: Same as Pre-Procedure Plan Follow-up Appointments: Return Appointment in 1 week. Dressing Change Frequency: Do not change entire dressing for one week. Skin Barriers/Peri-Wound Care: TCA Cream or Ointment Wound Cleansing: May shower and wash wound with soap and water. Primary Wound Dressing: Wound #28 Right,Lateral Lower Leg: Calcium Alginate with Silver Wound #29 Right,Posterior Lower Leg: Calcium Alginate with Silver Wound #30 Right,Distal,Lateral Lower Leg: Calcium Alginate with Silver Wound #31 Right,Distal,Posterior Lower Leg: Calcium  Alginate with  Silver Wound #32 Left,Anterior Lower Leg: Calcium Alginate with Silver Wound #33 Left,Proximal,Lateral Lower Leg: Calcium Alginate with Silver Wound #34 Left,Distal,Lateral Lower Leg: Calcium Alginate with Silver Secondary Dressing: Wound #28 Right,Lateral Lower Leg: ABD pad Wound #29 Right,Posterior Lower Leg: ABD pad Wound #30 Right,Distal,Lateral Lower Leg: ABD pad Wound #31 Right,Distal,Posterior Lower Leg: ABD pad Wound #32 Left,Anterior Lower Leg: ABD pad Wound #33 Left,Proximal,Lateral Lower Leg: ABD pad Wound #34 Left,Distal,Lateral Lower Leg: ABD pad Edema Control: Wound #28 Right,Lateral Lower Leg: 4 layer compression - Bilateral Wound #29 Right,Posterior Lower Leg: 4 layer compression - Bilateral Wound #30 Right,Distal,Lateral Lower Leg: 4 layer compression - Bilateral Wound #31 Right,Distal,Posterior Lower Leg: 4 layer compression - Bilateral Wound #32 Left,Anterior Lower Leg: 4 layer compression - Bilateral Wound #33 Left,Proximal,Lateral Lower Leg: 4 layer compression - Bilateral Wound #34 Left,Distal,Lateral Lower Leg: 4 layer compression - Bilateral 1. After an aggressive debridement still silver alginate under 4-layer compression 2. I have asked the patient to use her external compression pumps once a day for 1 hour. I doubt she will be compliant with more than that Electronic Signature(s) Signed: 01/17/2019 5:42:33 PM By: Linton Ham MD Entered By: Linton Ham on 01/17/2019 10:01:31 -------------------------------------------------------------------------------- SuperBill Details Patient Name: Date of Service: [name] [name], [name] [name] 01/17/2019 Medical Record [id number] Patient Account Number: [id number] Date of Birth/Sex: Treating RN: [birth year] (73 y.o. [name] Primary Care Provider: [name] Other Clinician: Referring Provider: Treating Provider/Extender:Robson, Tenna Child, [place] Siren in Treatment:  1 Diagnosis Coding ICD-10 Codes Code Description I89.0 Lymphedema, not elsewhere classified L97.221 Non-pressure chronic ulcer of left calf limited to breakdown of skin L97.211 Non-pressure chronic ulcer of right calf limited to breakdown of skin E11.622 Type 2 diabetes mellitus with other skin ulcer Facility Procedures CPT4 Code Description: [id number] - DEB SUBQ TISSUE 20 SQ CM/< ICD-10 Diagnosis Description L97.221 Non-pressure chronic ulcer of left calf limited to breakdo L97.211 Non-pressure chronic ulcer of right calf limited to breakd Modifier: wn of skin own of skin Quantity: 1 CPT4 Code Description: [id number] - DEB SUBQ TISS EA ADDL 20CM ICD-10 Diagnosis Description L97.221 Non-pressure chronic ulcer of left calf limited to breakdo L97.211 Non-pressure chronic ulcer of right calf limited to breakd Modifier: wn of skin own of skin Quantity: 2 Physician Procedures CPT4 Code Description: [id number] - WC PHYS SUBQ TISS 20 SQ CM ICD-10 Diagnosis Description L97.221 Non-pressure chronic ulcer of left calf limited to breakdo L97.211 Non-pressure chronic ulcer of right calf limited to breakd Modifier: wn of skin own of skin Quantity: 1 CPT4 Code Description: [id number] 11045 - WC PHYS SUBQ TISS EA ADDL 20 CM ICD-10 Diagnosis Description L97.221 Non-pressure chronic ulcer of left calf limited to breakdo L97.211 Non-pressure chronic ulcer of right calf limited to breakd Modifier: wn of skin own of skin Quantity: 2 Electronic Signature(s) Signed: 01/17/2019 5:42:33 PM By: Linton Ham MD Entered By: Linton Ham on 01/17/2019 10:02:05

## 2019-03-14 NOTE — Progress Notes (Signed)
[name], [name] ([id number]) Visit Report for 02/14/2019 Arrival Information Details Patient Name: Date of Service: [name] [name], [name] [name] 02/14/2019 8:15 AM Medical Record [id number] Patient Account Number: [id number] Date of Birth/Sex: Treating RN: 02/24/1946 (73 y.o. Clearnce Sorrel Primary Care Brailon Don: [name] Other Clinician: Referring Lyle Niblett: Treating Annet Manukyan/Extender:Robson, Tenna Child, [place] Siren in Treatment: 5 Visit Information History Since Last Visit Walker Added or deleted any medications: No Patient Arrived: Any new allergies or adverse reactions: No Arrival Time: 08:19 Had a fall or experienced change in No Accompanied By: self activities of daily living that may affect Transfer Assistance: None risk of falls: Patient Identification Verified: Yes Signs or symptoms of abuse/neglect since last No Secondary Verification Process Completed: Yes visito Patient Requires Transmission-Based No Hospitalized since last visit: No Precautions: Implantable device outside of the clinic excluding No Patient Has Alerts: No cellular tissue based products placed in the center since last visit: Has Dressing in Place as Prescribed: Yes Has Compression in Place as Prescribed: Yes Pain Present Now: No Electronic Signature(s) Signed: 02/17/2019 5:20:59 PM By: Kela Millin Entered By: Kela Millin on 02/14/2019 08:19:53 -------------------------------------------------------------------------------- Compression Therapy Details Patient Name: Date of Service: [name], [name] 02/14/2019 8:15 AM Medical Record [id number] Patient Account Number: [id number] Date of Birth/Sex: Treating RN: [birth year] (73 y.o. [name] Primary Care Henry Demeritt: [name] Other Clinician: Referring Lindsy Cerullo: Treating Kevron Patella/Extender:Robson, Tenna Child, [place] Si[place] in Treatment: 5 Compression Therapy Performed for Wound Wound  #28 Right,Lateral Lower Leg Assessment: Performed By: Clinician Carlene Coria, RN Compression Type: Four Layer Post Procedure Diagnosis Same as Pre-procedure Electronic Signature(s) Signed: 03/14/2019 2:53:59 PM By: Carlene Coria RN Entered By: Carlene Coria on 02/14/2019 08:59:14 -------------------------------------------------------------------------------- Compression Therapy Details Patient Name: Date of Service: [name], [name] 02/14/2019 8:15 AM Medical Record [id number] Patient Account Number: [id number] Date of Birth/Sex: Treating RN: 1946-02-24 (73 y.o. [name] Primary Care Rynell Ciotti: [name] Other Clinician: Referring Hoda Hon: Treating Katherleen Folkes/Extender:Robson, Tenna Child, [place] Si[place] in Treatment: 5 Compression Therapy Performed for Wound Wound #29 Right,Posterior Lower Leg Assessment: Performed By: Clinician Carlene Coria, RN Compression Type: Four Layer Post Procedure Diagnosis Same as Pre-procedure Electronic Signature(s) Signed: 03/14/2019 2:53:59 PM By: Carlene Coria RN Entered By: Carlene Coria on 02/14/2019 08:59:14 -------------------------------------------------------------------------------- Compression Therapy Details Patient Name: Date of Service: [name], [name] 02/14/2019 8:15 AM Medical Record [id number] Patient Account Number: [id number] Date of Birth/Sex: Treating RN: Aug 21, 1945 (73 y.o. [name] Primary Care Jyden Kromer: [name] Other Clinician: Referring Kristofer Schaffert: Treating Daniele Yankowski/Extender:Robson, Tenna Child, [place] Si[place] in Treatment: 5 Compression Therapy Performed for Wound Wound #31 Right,Distal,Posterior Lower Leg Assessment: Performed By: Clinician Carlene Coria, RN Compression Type: Four Layer Post Procedure Diagnosis Same as Pre-procedure Electronic Signature(s) Signed: 03/14/2019 2:53:59 PM By: Carlene Coria RN Entered By: Carlene Coria on 02/14/2019  08:59:14 -------------------------------------------------------------------------------- Compression Therapy Details Patient Name: Date of Service: [name], [name] 02/14/2019 8:15 AM Medical Record [id number] Patient Account Number: [id number] Date of Birth/Sex: Treating RN: [birth year] (73 y.o. [name] Primary Care Dedria Endres: [name] Other Clinician: Referring Ziaire Hagos: Treating Albion Weatherholtz/Extender:Robson, Tenna Child, [place] Si[place] in Treatment: 5 Compression Therapy Performed for Wound Wound #32 Left,Anterior Lower Leg Assessment: Performed By: Clinician Carlene Coria, RN Compression Type: Four Layer Post Procedure Diagnosis Same as Pre-procedure Electronic Signature(s) Signed: 03/14/2019 2:53:59 PM By: Carlene Coria RN Entered By: Carlene Coria on 02/14/2019 08:59:14 -------------------------------------------------------------------------------- Compression Therapy Details Patient Name: Date of Service: [name], [name] 02/14/2019 8:15 AM Medical Record [id number] Patient Account Number: [id number] Date of Birth/Sex: Treating RN: [birth year] (73 y.o. F) Epps, Morey Hummingbird  Primary Care Khylie Larmore: [name] Other Clinician: Referring Ireanna Finlayson: Treating Alnita Aybar/Extender:Robson, Tenna Child, [place] Si[place] in Treatment: 5 Compression Therapy Performed for Wound Wound #34 Left,Distal,Lateral Lower Leg Assessment: Performed By: Clinician Carlene Coria, RN Compression Type: Four Layer Post Procedure Diagnosis Same as Pre-procedure Electronic Signature(s) Signed: 03/14/2019 2:53:59 PM By: Carlene Coria RN Entered By: Carlene Coria on 02/14/2019 08:59:14 -------------------------------------------------------------------------------- Compression Therapy Details Patient Name: Date of Service: [name], [name] 02/14/2019 8:15 AM Medical Record [id number] Patient Account Number: [id number] Date of Birth/Sex: Treating RN: 1946-01-04  (73 y.o. [name] Primary Care Jentri Aye: [name] Other Clinician: Referring Valentine Barney: Treating Chamille Werntz/Extender:Robson, Tenna Child, [place] Si[place] in Treatment: 5 Compression Therapy Performed for Wound Wound #33 Left,Proximal,Lateral Lower Leg Assessment: Performed By: Clinician Carlene Coria, RN Compression Type: Four Layer Post Procedure Diagnosis Same as Pre-procedure Electronic Signature(s) Signed: 03/14/2019 2:53:59 PM By: Carlene Coria RN Entered By: Carlene Coria on 02/14/2019 08:59:14 -------------------------------------------------------------------------------- Compression Therapy Details Patient Name: Date of Service: EVADNA, DONAGHY 02/14/2019 8:15 AM Medical Record [id number] Patient Account Number: [id number] Date of Birth/Sex: Treating RN: Aug 30, 1945 (73 y.o. [name] Primary Care Juliani Laduke: [name] Other Clinician: Referring Lamoine Fredricksen: Treating Per Beagley/Extender:Robson, Tenna Child, [place] Si[place] in Treatment: 5 Compression Therapy Performed for Wound Wound #36 Left,Posterior Lower Leg Assessment: Performed By: Clinician Carlene Coria, RN Compression Type: Four Layer Post Procedure Diagnosis Same as Pre-procedure Electronic Signature(s) Signed: 03/14/2019 2:53:59 PM By: Carlene Coria RN Entered By: Carlene Coria on 02/14/2019 08:59:14 -------------------------------------------------------------------------------- Encounter Discharge Information Details Patient Name: Date of Service: TRESSIE, RAGIN 02/14/2019 8:15 AM Medical Record [id number] Patient Account Number: [id number] Date of Birth/Sex: Treating RN: Aug 16, 1945 (73 y.o. Clearnce Sorrel Primary Care Lindsay Straka: [name] Other Clinician: Referring Nyaisha Simao: Treating Judy Goodenow/Extender:Robson, Tenna Child, [place] Si[place] in Treatment: 5 Encounter Discharge Information Items Discharge Condition: Stable Ambulatory  Status: Walker Discharge Destination: Home Transportation: Other Accompanied By: self Schedule Follow-up Appointment: Yes Clinical Summary of Care: Patient Declined Electronic Signature(s) Signed: 02/17/2019 5:20:59 PM By: Kela Millin Entered By: Kela Millin on 02/14/2019 09:45:19 -------------------------------------------------------------------------------- Lower Extremity Assessment Details Patient Name: Date of Service: SHIQUITA, COLLIGNON 02/14/2019 8:15 AM Medical Record [id number] Patient Account Number: [id number] Date of Birth/Sex: Treating RN: 03-07-1946 (73 y.o. Clearnce Sorrel Primary Care Sherlonda Flater: [name] Other Clinician: Referring Evalene Vath: Treating Gerry Heaphy/Extender:Robson, Tenna Child, [place] Si[place] in Treatment: 5 Edema Assessment Assessed: [Left: No] [Right: No] Edema: [Left: Yes] [Right: Yes] Calf Left: Right: Point of Measurement: 30 cm From Medial Instep 55 cm 54 cm Ankle Left: Right: Point of Measurement: 8 cm From Medial Instep 33.4 cm 36.5 cm Vascular Assessment Pulses: Dorsalis Pedis Palpable: [Left:Yes] [Right:Yes] Electronic Signature(s) Signed: 02/17/2019 5:20:59 PM By: Kela Millin Entered By: Kela Millin on 02/14/2019 08:21:52 -------------------------------------------------------------------------------- Multi Wound Chart Details Patient Name: Date of Service: ANALISIA, KINGSFORD 02/14/2019 8:15 AM Medical Record [id number] Patient Account Number: [id number] Date of Birth/Sex: Treating RN: [birth year] (73 y.o. [name] Primary Care Miguelina Fore: [name] Other Clinician: Referring Marcus Groll: Treating Kenza Munar/Extender:Robson, Tenna Child, [place] Si[place] in Treatment: 5 Vital Signs Height(in): 71 Pulse(bpm): 75 Weight(lbs): 240 Blood Pressure(mmHg): 168/62 Body Mass Index(BMI): 33 Temperature(F): 97.9 Respiratory 18 Rate(breaths/min): Photos: [28:No Photos]  [29:No Photos] [31:No Photos] Wound Location: [28:Right Lower Leg - Lateral] [29:Right Lower Leg - Posterior Right Lower Leg - Posterior,] [31:Distal] Wounding Event: [28:Gradually Appeared] [29:Gradually Appeared] [31:Gradually Appeared] Primary Etiology: [28:Lymphedema] [29:Lymphedema] [31:Lymphedema] 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] [29:Lower Extremity] [31:Lower Extremity] Comorbid History: [28:Cataracts, Lymphedema, Cataracts, Lymphedema,  Cataracts, Lymphedema, Arrhythmia, Congestive Heart Failure, Hypertension, Heart Failure, Hypertension, Heart Failure, Hypertension, Peripheral Venous Disease, Peripheral Venous Disease,  Peripheral Venous Disease, Type II Diabetes, Osteoarthritis, Neuropathy, Osteoarthritis, Neuropathy, Osteoarthritis, Neuropathy, Confinement Anxiety] [29:Arrhythmia, Congestive Type II Diabetes, Confinement Anxiety] [31:Arrhythmia, Congestive Type II  Diabetes, Confinement Anxiety] Date Acquired: [28:08/05/2018] [29:08/05/2018] [31:08/05/2018] Weeks of Treatment: [28:5] [29:5] [31:5] Wound Status: [28:Open] [29:Open] [31:Open] Clustered Quantity: [28:N/A] [29:N/A] [31:N/A] Measurements L x W x D 5x5x0.1 [29:0x0x0] [31:3x2x0.1] (cm) Area (cm) : [28:19.635] [29:0] [31:4.712] Volume (cm) : [28:1.963] [29:0] [31:0.471] % Reduction in Area: [28:45.10%] [29:100.00%] [31:-361.50%] % Reduction in Volume: 45.10% [29:100.00%] [31:-361.80%] Classification: [28:Full Thickness Without Exposed Support Structures Exposed Support Structures Exposed Support Structures] [29:Full Thickness Without] [31:Full Thickness Without] Exudate Amount: [28:Medium] [29:None Present] [31:Medium] Exudate Type: [28:Purulent] [29:N/A] [31:Purulent] Exudate Color: [28:yellow, brown, green] [29:N/A] [31:yellow, brown, green] Wound Margin: [28:Flat and Intact] [29:Flat and Intact] [31:Flat and Intact] Granulation Amount:  [28:Large (67-100%)] [29:None Present (0%)] [31:Medium (34-66%)] Granulation Quality: [28:Pink] [29:N/A] [31:Red] Necrotic Amount: [28:Small (1-33%)] [29:None Present (0%)] [31:Medium (34-66%)] Exposed Structures: [28:Fat Layer (Subcutaneous Tissue) Exposed: Yes Fascia: No Tendon: No Muscle: No Joint: No Bone: No] [29:Fascia: No Fat Layer (Subcutaneous Tissue) Exposed: No Tendon: No Muscle: No Joint: No Bone: No] [31:Fat Layer (Subcutaneous Tissue) Exposed: Yes  Fascia: No Tendon: No Muscle: No Joint: No Bone: No] Epithelialization: [28:Small (1-33%)] [29:Large (67-100%)] [31:Small (1-33%)] Procedures Performed: [28:Compression Therapy 32] [29:Compression Therapy 33] [31:Compression Therapy 34] Photos: [28:No Photos] [29:No Photos] [31:No Photos] Wound Location: [28:Left Lower Leg - AnteriorLeft Lower Leg - Lateral,] [29:Proximal] [31:Left Lower Leg - Lateral, Distal] Wounding Event: [28:Gradually Appeared] [29:Gradually Appeared] [31:Gradually Appeared] Primary Etiology: [28:Lymphedema] [29:Lymphedema] [31:Lymphedema] 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] [29:Lower Extremity] [31:Lower Extremity] Comorbid History: [28:Cataracts, Lymphedema, Cataracts, Lymphedema, Cataracts, Lymphedema, Arrhythmia, Congestive Heart Failure, Hypertension, Heart Failure, Hypertension, Heart Failure, Hypertension, Peripheral Venous Disease, Peripheral Venous Disease,  Peripheral Venous Disease, Type II Diabetes, Osteoarthritis, Neuropathy, Osteoarthritis, Neuropathy, Osteoarthritis, Neuropathy, Confinement Anxiety] [29:Arrhythmia, Congestive Type II Diabetes, Confinement Anxiety] [31:Arrhythmia, Congestive Type II  Diabetes, Confinement Anxiety] Date Acquired: [28:08/05/2018] [29:08/05/2018] [31:01/10/2019] Weeks of Treatment: [28:5] [29:5] [31:5] Wound Status: [28:Open] [29:Open] [31:Open] Clustered Quantity: [28:N/A] [29:2]  [31:N/A] Measurements L x W x D 0x0x0 [29:2.5x3.3x0.1] [31:1.4x1.5x0.1] (cm) Area (cm) : [28:0] [29:6.48] [31:1.649] Volume (cm) : [28:0] [29:0.648] [31:0.165] % Reduction in Area: [28:100.00%] [29:81.20%] [31:38.60%] % Reduction in Volume: 100.00% [29:81.30%] [31:38.70%] Classification: [28:Full Thickness Without Exposed Support Structures Exposed Support Structures Exposed Support Structures] [29:Full Thickness Without] [31:Full Thickness Without] Exudate Amount: [28:None Present] [29:Medium] [31:Medium] Exudate Type: [28:N/A] [29:Purulent] [31:Purulent] Exudate Color: [28:N/A] [29:yellow, brown, green] [31:yellow, brown, green] Wound Margin: [28:Flat and Intact] [29:Flat and Intact] [31:Flat and Intact] Granulation Amount: [28:None Present (0%)] [29:Small (1-33%)] [31:Small (1-33%)] Granulation Quality: [28:N/A] [29:Red, Pink] [31:Red] Necrotic Amount: [28:None Present (0%)] [29:Large (67-100%)] [31:Large (67-100%)] Exposed Structures: [28:Fascia: No Fat Layer (Subcutaneous Tissue) Exposed: Yes Tissue) Exposed: No Tendon: No Muscle: No Joint: No Bone: No] [29:Fat Layer (Subcutaneous Fat Layer (Subcutaneous Fascia: No Tendon: No Muscle: No Joint: No Bone: No] [31:Tissue) Exposed: Yes  Fascia: No Tendon: No Muscle: No Joint: No Bone: No] Epithelialization: [28:Large (67-100%)] [29:Small (1-33%)] [31:Small (1-33%)] Procedures Performed: Compression Therapy [28:36] [29:Compression Therapy N/A] [31:Compression Therapy N/A] Photos: [28:No Photos] [29:N/A] [31:N/A] Wound Location: [28:Left Lower Leg - Posterior N/A] [31:N/A] Wounding Event: [28:Gradually Appeared] [29:N/A] [31:N/A] Primary Etiology: [28:Lymphedema] [29:N/A] [31:N/A] Secondary Etiology: [28:N/A] [29:N/A] [31:N/A] Comorbid History: [28:Cataracts, Lymphedema, N/A Arrhythmia, Congestive  Heart Failure, Hypertension, Peripheral Venous Disease, Type II Diabetes, Osteoarthritis, Neuropathy, Confinement Anxiety] [31:N/A] Date  Acquired: [28:01/26/2019] [29:N/A] [31:N/A] Weeks of Treatment: [28:0] [29:N/A] [31:N/A] Wound Status: [28:Open] [29:N/A] [31:N/A] Clustered Quantity: [28:N/A] [29:N/A] [31:N/A] Measurements L x W x D 2x3x0.1 [29:N/A] [31:N/A] (cm) Area (cm) : [82:5.003] [29:N/A] [31:N/A] Volume (cm) : [28:0.471] [29:N/A] [31:N/A] % Reduction in Area: [28:0.00%] [29:N/A] [31:N/A] % Reduction in Volume: 0.00% [29:N/A] [31:N/A] Classification: [28:Full Thickness Without Exposed Support Structures] [29:N/A] [31:N/A] Exudate Amount: [28:Large] [29:N/A] [31:N/A] Exudate Type: [28:Purulent] [29:N/A] [31:N/A] Exudate Color: [28:yellow, brown, green] [29:N/A] [31:N/A] Wound Margin: [28:Distinct, outline attached N/A] [31:N/A] Granulation Amount: [28:Small (1-33%)] [29:N/A] [31:N/A] Granulation Quality: [28:Pink, Pale] [29:N/A] [31:N/A] Necrotic Amount: [28:Large (67-100%)] [29:N/A] [31:N/A] Exposed Structures: [28:Fat Layer (Subcutaneous N/A Tissue) Exposed: Yes Fascia: No Tendon: No Muscle: No Joint: No Bone: No] [31:N/A] Epithelialization: [28:None] [29:N/A N/A] [31:N/A N/A] Treatment Notes Electronic Signature(s) Signed: 02/14/2019 5:17:49 PM By: Linton Ham MD Signed: 03/14/2019 2:53:59 PM By: Carlene Coria RN Entered By: Linton Ham on 02/14/2019 09:02:07 -------------------------------------------------------------------------------- Multi-Disciplinary Care Plan Details Patient Name: Date of Service: CHANTILLE, NAVARRETE 02/14/2019 8:15 AM Medical Record [id number] Patient Account Number: [id number] Date of Birth/Sex: Treating RN: April 22, 1945 (73 y.o. [name] Primary Care Carlyn Mullenbach: Other Clinician: [name] Referring Larita Deremer: Treating Denarius Sesler/Extender:Robson, Tenna Child, [place] Si[place] in Treatment: 5 Active Inactive Abuse / Safety / Falls / Self Care Management Nursing Diagnoses: Impaired physical mobility Potential for falls Goals: Patient will not  develop complications from immobility Date Initiated: 01/10/2019 Date Inactivated: 02/14/2019 Target Resolution Date: 02/10/2019 Goal Status: Met Patient will not experience any injury related to falls Date Initiated: 01/10/2019 Date Inactivated: 02/14/2019 Target Resolution Date: 02/10/2019 Goal Status: Met Patient/caregiver will verbalize understanding of skin care regimen Date Initiated: 01/10/2019 Target Resolution Date: 03/10/2019 Goal Status: Active Interventions: Assess Activities of Daily Living upon admission and as needed Assess fall risk on admission and as needed Assess: immobility, friction, shearing, incontinence upon admission and as needed Assess impairment of mobility on admission and as needed per policy Assess personal safety and home safety (as indicated) on admission and as needed Assess self care needs on admission and as needed Notes: Wound/Skin Impairment Nursing Diagnoses: Knowledge deficit related to ulceration/compromised skin integrity Goals: Patient/caregiver will verbalize understanding of skin care regimen Date Initiated: 01/10/2019 Target Resolution Date: 03/10/2019 Goal Status: Active Ulcer/skin breakdown will have a volume reduction of 30% by week 4 Target Resolution Date Initiated: 01/10/2019 Date Inactivated: 02/14/2019 Date: 02/10/2019 Unmet Goal Status: Unmet Reason: comorbities/lymphedema Ulcer/skin breakdown will have a volume reduction of 50% by week 8 Date Initiated: 02/14/2019 Target Resolution Date: 03/17/2019 Goal Status: Active Interventions: Assess patient/caregiver ability to obtain necessary supplies Assess patient/caregiver ability to perform ulcer/skin care regimen upon admission and as needed Assess ulceration(s) every visit Notes: Electronic Signature(s) Signed: 03/14/2019 2:53:59 PM By: Carlene Coria RN Entered By: Carlene Coria on 02/14/2019  08:47:21 -------------------------------------------------------------------------------- Pain Assessment Details Patient Name: Date of Service: REESHEMAH, NAZARYAN 02/14/2019 8:15 AM Medical Record [id number] Patient Account Number: [id number] Date of Birth/Sex: Treating RN: 04/21/1945 (73 y.o. Clearnce Sorrel Primary Care Jaskiran Pata: [name] Other Clinician: Referring Aris Moman: Treating Karlene Southard/Extender:Robson, Tenna Child, [place] Si[place] in Treatment: 5 Active Problems Location of Pain Severity and Description of Pain Patient Has Paino No Site Locations Pain Management and Medication Current Pain Management: Electronic Signature(s) Signed: 02/17/2019 5:20:59 PM By: Kela Millin Entered By: Kela Millin on 02/14/2019 08:20:33 -------------------------------------------------------------------------------- Patient/Caregiver Education Details Patient Name: Waverly Ferrari Date of Service: 11/10/2020andnbsp8:15 AM Medical Record [id number]  Patient Account Number: [id number] Date of Birth/Gender: Treating RN: October 03, 1945 (73 y.o. [name] Primary Care Physician: [name] Other Clinician: Referring Physician: Treating Physician/Extender:Robson, Tenna Child, [place] Si[place] in Treatment: 5 Education Assessment Education Provided To: Patient Education Topics Provided Wound/Skin Impairment: Methods: Explain/Verbal Responses: State content correctly Electronic Signature(s) Signed: 03/14/2019 2:53:59 PM By: Carlene Coria RN Entered By: Carlene Coria on 02/14/2019 07:57:34 -------------------------------------------------------------------------------- Wound Assessment Details Patient Name: Date of Service: TAURA, LAMARRE 02/14/2019 8:15 AM Medical Record [id number] Patient Account Number: [id number] Date of Birth/Sex: Treating RN: Apr 14, 1945 (73 y.o. Clearnce Sorrel Primary Care Murle Hellstrom: [name] Other Clinician: Referring Naomee Nowland: Treating Olamide Lahaie/Extender:Robson, Tenna Child, [place] in Treatment: 5 Wound Status Wound Number: 28 Primary Lymphedema Etiology: Wound Location: Right Lower Leg - Lateral Secondary Diabetic Wound/Ulcer of the Lower Extremity Wounding Event: Gradually Appeared Etiology: Date Acquired: 08/05/2018 Wound Open Weeks Of Treatment: 5 Status: Clustered Wound: No Comorbid Cataracts, Lymphedema, Arrhythmia, History: Congestive Heart Failure, Hypertension, Peripheral Venous Disease, Type II Diabetes, Osteoarthritis, Neuropathy, Confinement Anxiety Photos Wound Measurements Length: (cm) 5 % Reduc Width: (cm) 5 % Reduc Depth: (cm) 0.1 Epithel Area: (cm) 19.635 Tunnel Volume: (cm) 1.963 Underm Wound Description Classification: Full Thickness Without Exposed Support Foul Od Structures Slough/ Wound Flat and Intact Margin: Exudate Medium Amount: Exudate Purulent Type: Exudate yellow, brown, green Color: Wound Bed Granulation Amount: Large (67-100%) Granulation Quality: Pink Fascia Necrotic Amount: Small (1-33%) Fat Lay Necrotic Quality: Adherent Slough Tendon Muscle Joint E Bone Ex or After Cleansing: No Fibrino Yes Exposed Structure Exposed: No er (Subcutaneous Tissue) Exposed: Yes Exposed: No Exposed: No xposed: No posed: No tion in Area: 45.1% tion in Volume: 45.1% ialization: Small (1-33%) ing: No ining: No Electronic Signature(s) Signed: 02/15/2019 4:27:20 PM By: Mikeal Hawthorne EMT/HBOT Signed: 02/17/2019 5:20:59 PM By: Kela Millin Entered By: Mikeal Hawthorne on 02/15/2019 11:17:17 -------------------------------------------------------------------------------- Wound Assessment Details Patient Name: Date of Service: MADILYNN, MONTANTE 02/14/2019 8:15 AM Medical Record [id number] Patient Account Number: [id number] Date of Birth/Sex: Treating RN: 13-Feb-1946 (73 y.o. Clearnce Sorrel Primary Care Yoshi Vicencio: [name] Other Clinician: Referring Daniyla Pfahler: Treating Raeghan Demeter/Extender:Robson, Tenna Child, [place] Si[place] in Treatment: 5 Wound Status Wound Number: 29 Primary Lymphedema Etiology: Wound Location: Right Lower Leg - Posterior Secondary Diabetic Wound/Ulcer of the Lower Extremity Wounding Event: Gradually Appeared Etiology: Date Acquired: 08/05/2018 Wound Open Weeks Of Treatment: 5 Status: Clustered Wound: No Comorbid Cataracts, Lymphedema, Arrhythmia, History: Congestive Heart Failure, Hypertension, Peripheral Venous Disease, Type II Diabetes, Osteoarthritis, Neuropathy, Confinement Anxiety Wound Measurements Length: (cm) 0 % Reduc Width: (cm) 0 % Reduc Depth: (cm) 0 Epithel Area: (cm) 0 Tunnel Volume: (cm) 0 Underm Wound Description Classification: Full Thickness Without Exposed Support Foul Od Structures Slough/ Wound Flat and Intact Margin: Exudate None Present Amount: Wound Bed Granulation Amount: None Present (0%) Necrotic Amount: None Present (0%) Fascia Fat Lay Tendon Muscle Joint E Bone Ex or After Cleansing: No Fibrino No Exposed Structure Exposed: No er (Subcutaneous Tissue) Exposed: No Exposed: No Exposed: No xposed: No posed: No tion in Area: 100% tion in Volume: 100% ialization: Large (67-100%) ing: No ining: No Electronic Signature(s) Signed: 02/17/2019 5:20:59 PM By: Kela Millin Entered By: Kela Millin on 02/14/2019 08:32:43 -------------------------------------------------------------------------------- Wound Assessment Details Patient Name: Date of Service: LUANE, ROCHON 02/14/2019 8:15 AM Medical Record [id number] Patient Account Number: [id number] Date of Birth/Sex: Treating RN: [birth year] (72 y.o. 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 Event: Gradually Appeared Etiology: Date Acquired: 08/05/2018 Wound Open Weeks Of Treatment: 5 Status: Clustered Wound: No Comorbid Cataracts, Lymphedema, Arrhythmia, History: Congestive Heart Failure, Hypertension, Peripheral Venous Disease, Type II Diabetes, Osteoarthritis, Neuropathy, Confinement Anxiety Photos Wound Measurements Length: (cm) 3 % Reduc Width: (cm) 2 % Reduc Depth: (cm) 0.1 Epithel Area: (cm) 4.712 Tunnel Volume: (cm) 0.471 Underm Wound Description Classification: Full Thickness Without Exposed Support Foul Od Structures Slough/ Wound Flat and Intact Margin: Exudate Medium Amount: Exudate Purulent Type: Exudate yellow, brown, green Color: Wound Bed Granulation Amount: Medium (34-66%) Granulation Quality: Red Fascia Necrotic Amount: Medium (34-66%) Fat Lay Necrotic Quality: Adherent Slough Tendon Muscle Joint E Bone Ex or After Cleansing: No Fibrino Yes Exposed Structure Exposed: No er (Subcutaneous Tissue) Exposed: Yes Exposed: No Exposed: No xposed: No posed: No tion in Area: -361.5% tion in Volume: -361.8% ialization: Small (1-33%) ing: No ining: No Electronic Signature(s) Signed: 02/15/2019 4:27:20 PM By: Mikeal Hawthorne EMT/HBOT Signed: 02/17/2019 5:20:59 PM By: Kela Millin Entered By: Mikeal Hawthorne on 02/15/2019 11:17:45 -------------------------------------------------------------------------------- Wound Assessment Details Patient Name: Date of Service: QUETZAL, MEANY 02/14/2019 8:15 AM Medical Record [id number] Patient Account Number: [id number] Date of Birth/Sex: Treating RN: 01-16-1946 (73 y.o. 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 Event: Gradually Appeared Etiology: Date Acquired: 08/05/2018 Wound Open Weeks Of Treatment: 5 Status: Clustered Wound: No Comorbid Cataracts, Lymphedema, Arrhythmia, History: Congestive Heart Failure, Hypertension, Peripheral Venous Disease, Type II Diabetes, Osteoarthritis, Neuropathy, Confinement Anxiety Photos Wound Measurements Length: (cm) 0 % Reduc Width: (cm) 0 % Reduc Depth: (cm) 0 Epithel Area: (cm) 0 Tunnel Volume: (cm) 0 Underm Wound Description Classification: Full Thickness Without Exposed Support Foul Od Structures Slough/ Wound Flat and Intact Margin: Exudate None Present Amount: Wound Bed Granulation Amount: None Present (0%) Necrotic Amount: None Present (0%) Fascia Fat Lay Tendon Muscle Joint E Bone Ex or After Cleansing: No Fibrino No Exposed Structure Exposed: No er (Subcutaneous Tissue) Exposed: No Exposed: No Exposed: No xposed: No posed: No tion in Area: 100% tion in Volume: 100% ialization: Large (67-100%) ing: No ining: No Electronic Signature(s) Signed: 02/15/2019 4:27:20 PM By: Mikeal Hawthorne EMT/HBOT Signed: 02/17/2019 5:20:59 PM By: Kela Millin Entered By: Mikeal Hawthorne on 02/15/2019 11:16:30 -------------------------------------------------------------------------------- Wound Assessment Details Patient Name: Date of Service: KARALINE, BURESH 02/14/2019 8:15 AM Medical Record [id number] Patient Account Number: [id number] Date of Birth/Sex: Treating RN: [birth year] (73 y.o. 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 Event: Gradually Appeared Etiology: Date Acquired: 08/05/2018 Wound Open Weeks Of Treatment: 5 Status: Clustered Wound: No Comorbid Cataracts, Lymphedema, Arrhythmia, History: Congestive Heart Failure, Hypertension, Peripheral Venous Disease, Type II Diabetes, Osteoarthritis, Neuropathy, Confinement Anxiety Photos Wound Measurements Length: (cm) 2.5 % Reduc Width: (cm) 3.3 % Reduc Depth: (cm) 0.1 Epithel Clustered Quantity: 2 Tunneli Area: (cm) 6.48 Underm Volume: (cm) 0.648 Wound Description Full Thickness Without Exposed Support Foul Od Classification: Structures Slough/ Wound Wound Flat and Intact Margin: Exudate Medium Amount:  Exudate Purulent Type: Exudate yellow, brown, green Color: Wound Bed Granulation Amount: Small (1-33%) Granulation Quality: Red, Pink Fascia E Necrotic Amount: Large (67-100%) Fat Laye Necrotic Quality: Adherent Slough Tendon E Muscle E Joint Ex Bone Exp or After Cleansing: No Fibrino Yes Exposed Structure xposed: No r (Subcutaneous Tissue) Exposed: Yes xposed: No xposed: No posed: No osed: No tion in Area: 81.2% tion in Volume: 81.3% ialization: Small (1-33%) ng: No ining: No Electronic Signature(s) Signed: 02/15/2019 4:27:20 PM By: Mikeal Hawthorne EMT/HBOT Signed: 02/17/2019 5:20:59 PM By: Kela Millin Entered By: Mikeal Hawthorne on 02/15/2019 11:12:02 -------------------------------------------------------------------------------- Wound Assessment Details Patient Name: Date of Service: ALOHILANI, LEVENHAGEN 02/14/2019 8:15 AM Medical Record [id number] Patient Account Number: [id number] Date of Birth/Sex: Treating RN: [birth year] (74 y.o. 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 Event: Gradually Appeared Etiology: Date Acquired: 01/10/2019 Wound Open Weeks Of Treatment: 5 Status: Clustered Wound: No Comorbid Cataracts, Lymphedema, Arrhythmia, History: Congestive Heart Failure, Hypertension, Peripheral Venous Disease, Type II Diabetes, Osteoarthritis, Neuropathy, Confinement Anxiety Photos Wound Measurements Length: (cm) 1.4 % Reduc Width: (cm) 1.5 % Reduc Depth: (cm) 0.1 Epithel Area: (cm) 1.649 Tunnel Volume: (cm) 0.165 Underm Wound Description Full Thickness Without Exposed Support Foul Od Classification: Structures Slough/ Wound Flat and Intact Margin: Exudate Medium Amount: Exudate Purulent Type: Exudate yellow, brown, green Color: Wound Bed Granulation Amount: Small (1-33%) Granulation Quality: Red Fascia Necrotic Amount: Large (67-100%) Fat Lay Necrotic Quality: Adherent Slough Tendon Muscle Joint E Bone Ex or After Cleansing: No Fibrino Yes Exposed Structure Exposed: No er (Subcutaneous Tissue) Exposed: Yes Exposed: No Exposed: No xposed: No posed: No tion in Area: 38.6% tion in Volume: 38.7% ialization: Small (1-33%) ing: No ining: No Electronic Signature(s) Signed: 02/15/2019 4:27:20 PM By: Mikeal Hawthorne EMT/HBOT Signed: 02/17/2019 5:20:59 PM By: Kela Millin Entered By: Mikeal Hawthorne on 02/15/2019 11:16:54 -------------------------------------------------------------------------------- Wound Assessment Details Patient Name: Date of Service: SHERENE, PLANCARTE 02/14/2019 8:15 AM Medical Record [id number] Patient Account Number: [id number] Date of Birth/Sex: Treating RN: 16-Jul-1945 (73 y.o. Clearnce Sorrel Primary Care Jill Stopka:  [name] Other Clinician: Referring Josphine Laffey: Treating Micajah Dennin/Extender:Robson, Tenna Child, [place] Siren in Treatment: 5 Wound Status Wound Number: 36 Primary Lymphedema Etiology: Wound Location: Left Lower Leg - Posterior Wound Open Wounding Event: Gradually Appeared Status: Date Acquired: 01/26/2019 Comorbid Cataracts, Lymphedema, Arrhythmia, Weeks Of Treatment: 0 History: Congestive Heart Failure, Hypertension, Clustered Wound: No Peripheral Venous Disease, Type II Diabetes, Osteoarthritis, Neuropathy, Confinement Anxiety Photos Wound Measurements Length: (cm) 2 % Reduct Width: (cm) 3 % Reduct Depth: (cm) 0.1 Epitheli Area: (cm) 4.712 Tunneli Volume: (cm) 0.471 Undermi Wound Description Full Thickness Without Exposed Support Foul Odo Classification: Structures Slough/F Wound Distinct, outline attached Margin: Exudate Large Amount: Exudate Purulent Type: Exudate yellow, brown, green Color: Wound Bed Granulation Amount: Small (1-33%) Granulation Quality: Pink, Pale Fascia E Necrotic Amount: Large (67-100%) Fat Laye Necrotic Quality: Adherent Slough Tendon E Muscle E Joint Ex Bone Exp r After Cleansing: No ibrino Yes Exposed Structure xposed: No r (Subcutaneous Tissue) Exposed: Yes xposed: No xposed: No posed: No osed: No ion in Area: 0% ion in Volume: 0% alization: None ng: No ning: No Electronic Signature(s) Signed: 02/15/2019 4:27:20 PM By: Mikeal Hawthorne EMT/HBOT Signed: 02/17/2019 5:20:59 PM By: Kela Millin Entered By: Mikeal Hawthorne on 02/15/2019 11:12:25 -------------------------------------------------------------------------------- Vitals  Details Patient Name: Date of Service: JAIMEY, FRANCHINI 02/14/2019 8:15 AM Medical Record [id number] Patient Account Number: [id number] Date of Birth/Sex: Treating RN: 10/31/1945 (73 y.o. Clearnce Sorrel Primary Care [name] Racanelli: [name] Other  Clinician: Referring Carvel Huskins: Treating Courteney Alderete/Extender:Robson, Tenna Child, [place] Siren in Treatment: 5 Vital Signs Time Taken: 08:10 Temperature (F): 97.9 Height (in): 71 Pulse (bpm): 75 Weight (lbs): 240 Respiratory Rate (breaths/min): 18 Body Mass Index (BMI): 33.5 Blood Pressure (mmHg): 168/62 Reference Range: 80 - 120 mg / dl Electronic Signature(s) Signed: 02/17/2019 5:20:59 PM By: Kela Millin Entered By: Kela Millin on 02/14/2019 08:20:27

## 2019-03-14 NOTE — Progress Notes (Signed)
OSCAR, CASADAY ([id number]) Visit Report for 01/17/2019 Arrival Information Details Patient Name: Date of Service: [name] [name], [name] [name] 01/17/2019 8:45 AM Medical Record [id number] Patient Account Number: [id number] Date of Birth/Sex: Treating RN: [birth year] (73 y.o. Clearnce Sorrel Primary Care Ashla Murph: [name] Other Clinician: Referring Yajahira Tison: Treating Chonita Gadea/Extender:Robson, Tenna Child, [place] Siren in Treatment: 1 Visit Information History Since Last Visit Walker Added or deleted any medications: No Patient Arrived: Any new allergies or adverse reactions: No Arrival Time: 08:23 Had a fall or experienced change in No Accompanied By: self activities of daily living that may affect Transfer Assistance: None risk of falls: Patient Identification Verified: Yes Signs or symptoms of abuse/neglect since last No Secondary Verification Process Completed: Yes visito Patient Requires Transmission-Based No Hospitalized since last visit: No Precautions: Implantable device outside of the clinic excluding No Patient Has Alerts: No cellular tissue based products placed in the center since last visit: Has Dressing in Place as Prescribed: Yes Has Compression in Place as Prescribed: Yes Pain Present Now: No Electronic Signature(s) Signed: 01/18/2019 5:37:20 PM By: Kela Millin Entered By: Kela Millin on 01/17/2019 08:24:12 -------------------------------------------------------------------------------- Compression Therapy Details Patient Name: Date of Service: [name] [name], [name] [name] 01/17/2019 8:45 AM Medical Record [id number] Patient Account Number: [id number] Date of Birth/Sex: Treating RN: [birth year] (73 y.o. [name] Primary Care Chiamaka Latka: [name] Other Clinician: Referring Keaunna Skipper: Treating Christyna Letendre/Extender:Robson, Tenna Child, [place] Siren in Treatment: 1 Compression Therapy Performed for Wound Wound  #28 Right,Lateral Lower Leg Assessment: Performed By: Clinician Carlene Coria, RN Compression Type: Four Layer Post Procedure Diagnosis Same as Pre-procedure Electronic Signature(s) Signed: 03/14/2019 3:01:15 PM By: Carlene Coria RN Entered By: Carlene Coria on 01/17/2019 09:32:20 -------------------------------------------------------------------------------- Compression Therapy Details Patient Name: Date of Service: [name] [name], [name] [name] 01/17/2019 8:45 AM Medical Record [id number] Patient Account Number: [id number] Date of Birth/Sex: Treating RN: 1945/07/22 (73 y.o. [name] Primary Care Tira Lafferty: [name] Other Clinician: Referring Quinnley Colasurdo: Treating Davette Nugent/Extender:Robson, Tenna Child, [place] Si[place] in Treatment: 1 Compression Therapy Performed for Wound Wound #29 Right,Posterior Lower Leg Assessment: Performed By: Clinician Carlene Coria, RN Compression Type: Four Layer Post Procedure Diagnosis Same as Pre-procedure Electronic Signature(s) Signed: 03/14/2019 3:01:15 PM By: Carlene Coria RN Entered By: Carlene Coria on 01/17/2019 09:32:20 -------------------------------------------------------------------------------- Compression Therapy Details Patient Name: Date of Service: [name] [name], [name] [name] 01/17/2019 8:45 AM Medical Record [id number] Patient Account Number: [id number] Date of Birth/Sex: Treating RN: [birth year] (73 y.o. [name] Primary Care Leeanna Slaby: [name] Other Clinician: Referring Lewanda Perea: Treating Tiffanny Lamarche/Extender:Robson, Tenna Child, [place] Si[place] in Treatment: 1 Compression Therapy Performed for Wound Wound #30 Right,Distal,Lateral Lower Leg Assessment: Performed By: Clinician Carlene Coria, RN Compression Type: Four Layer Post Procedure Diagnosis Same as Pre-procedure Electronic Signature(s) Signed: 03/14/2019 3:01:15 PM By: Carlene Coria RN Entered By: Carlene Coria on 01/17/2019  09:32:20 -------------------------------------------------------------------------------- Compression Therapy Details Patient Name: Date of Service: [name] [name], [name] [name] 01/17/2019 8:45 AM Medical Record [id number] Patient Account Number: [id number] Date of Birth/Sex: Treating RN: [birth year] (73 y.o. [name] Primary Care Hayla Hinger: [name] Other Clinician: Referring Latravious Levitt: Treating Annely Sliva/Extender:Robson, Tenna Child, [place] Si[place] in Treatment: 1 Compression Therapy Performed for Wound Wound #31 Right,Distal,Posterior Lower Leg Assessment: Performed By: Clinician Carlene Coria, RN Compression Type: Four Layer Post Procedure Diagnosis Same as Pre-procedure Electronic Signature(s) Signed: 03/14/2019 3:01:15 PM By: Carlene Coria RN Entered By: Carlene Coria on 01/17/2019 09:32:21 -------------------------------------------------------------------------------- Compression Therapy Details Patient Name: Date of Service: [name] [name], [name] [name] 01/17/2019 8:45 AM Medical Record [id number] Patient Account Number: [id number] Date of Birth/Sex: Treating RN: 06-Mar-1946 (73 y.o. F) Epps, Morey Hummingbird  Primary Care Lynford Espinoza: [name] Other Clinician: Referring Jacon Whetzel: Treating Vitoria Conyer/Extender:Robson, Tenna Child, [place] Si[place] in Treatment: 1 Compression Therapy Performed for Wound Wound #32 Left,Anterior Lower Leg Assessment: Performed By: Clinician Carlene Coria, RN Compression Type: Four Layer Post Procedure Diagnosis Same as Pre-procedure Electronic Signature(s) Signed: 03/14/2019 3:01:15 PM By: Carlene Coria RN Entered By: Carlene Coria on 01/17/2019 09:32:21 -------------------------------------------------------------------------------- Compression Therapy Details Patient Name: Date of Service: [name] [name], [name] [name] 01/17/2019 8:45 AM Medical Record [id number] Patient Account Number: [id number] Date of Birth/Sex: Treating  RN: 05/16/1945 (73 y.o. [name] Primary Care Brylan Seubert: [name] Other Clinician: Referring Beckem Tomberlin: Treating Kenadee Gates/Extender:Robson, Tenna Child, [place] Si[place] in Treatment: 1 Compression Therapy Performed for Wound Wound #33 Left,Proximal,Lateral Lower Leg Assessment: Performed By: Clinician Carlene Coria, RN Compression Type: Four Layer Post Procedure Diagnosis Same as Pre-procedure Electronic Signature(s) Signed: 03/14/2019 3:01:15 PM By: Carlene Coria RN Entered By: Carlene Coria on 01/17/2019 09:32:21 -------------------------------------------------------------------------------- Compression Therapy Details Patient Name: Date of Service: [name] [name], [name] [name] 01/17/2019 8:45 AM Medical Record [id number] Patient Account Number: [id number] Date of Birth/Sex: Treating RN: November 26, 1945 (73 y.o. [name] Primary Care Volney Reierson: [name] Other Clinician: Referring Kamillah Didonato: Treating Marley Pakula/Extender:Robson, Tenna Child, [place] Si[place] in Treatment: 1 Compression Therapy Performed for Wound Wound #34 Left,Distal,Lateral Lower Leg Assessment: Performed By: Clinician Carlene Coria, RN Compression Type: Four Layer Post Procedure Diagnosis Same as Pre-procedure Electronic Signature(s) Signed: 03/14/2019 3:01:15 PM By: Carlene Coria RN Entered By: Carlene Coria on 01/17/2019 09:32:21 -------------------------------------------------------------------------------- Encounter Discharge Information Details Patient Name: Date of Service: [name] [name], [name] [name] 01/17/2019 8:45 AM Medical Record [id number] Patient Account Number: [id number] Date of Birth/Sex: Treating RN: 16-Sep-1945 (73 y.o. [name] Primary Care Masiyah Engen: [name] Other Clinician: Referring Siraj Dermody: Treating Briseyda Fehr/Extender:Robson, Tenna Child, [place] Si[place] in Treatment: 1 Encounter Discharge Information Items Post Procedure  Vitals Discharge Condition: Stable Temperature (F): 97.7 Ambulatory Status: Walker Pulse (bpm): 68 Discharge Destination: Home Respiratory Rate (breaths/min): 18 Transportation: Private Auto Blood Pressure (mmHg): 142/64 Accompanied By: alone Schedule Follow-up Appointment: Yes Clinical Summary of Care: Patient Declined Electronic Signature(s) Signed: 01/17/2019 5:52:09 PM By: Levan Hurst RN, BSN Entered By: Levan Hurst on 01/17/2019 17:26:42 -------------------------------------------------------------------------------- Lower Extremity Assessment Details Patient Name: Date of Service: [name] [name], [name] [name] 01/17/2019 8:45 AM Medical Record [id number] Patient Account Number: [id number] Date of Birth/Sex: Treating RN: [birth year] (73 y.o. Clearnce Sorrel Primary Care Venesa Semidey: [name] Other Clinician: Referring Safaa Stingley: Treating Janeal Abadi/Extender:Robson, Tenna Child, [place] Si[place] in Treatment: 1 Edema Assessment Assessed: [Left: No] [Right: No] Edema: [Left: Yes] [Right: Yes] Calf Left: Right: Point of Measurement: cm From Medial Instep 46.6 cm 55 cm Ankle Left: Right: Point of Measurement: cm From Medial Instep 31 cm 29.2 cm Vascular Assessment Pulses: Dorsalis Pedis Palpable: [Left:Yes] [Right:Yes] Electronic Signature(s) Signed: 01/18/2019 5:37:20 PM By: Kela Millin Entered By: Kela Millin on 01/17/2019 08:33:24 -------------------------------------------------------------------------------- Multi Wound Chart Details Patient Name: Date of Service: [name] [name], [name] [name] 01/17/2019 8:45 AM Medical Record [id number] Patient Account Number: [id number] Date of Birth/Sex: Treating RN: 1945/05/08 (73 y.o. [name] Primary Care Evann Koelzer: [name] Other Clinician: Referring Mckinnley Smithey: Treating Peg Fifer/Extender:Robson, Tenna Child, [place] Si[place] in Treatment: 1 Vital Signs Height(in): 71 Pulse(bpm):  76 Weight(lbs): 240 Blood Pressure(mmHg): 142/64 Body Mass Index(BMI): 33 Temperature(F): 97.7 Respiratory 18 Rate(breaths/min): Photos: [28:No Photos] [29:No Photos] [30:No Photos] Wound Location: [28:Right Lower Leg - Lateral] [29:Right Lower Leg - Posterior Right Lower Leg - Lateral,] [30:Distal] Wounding Event: [28:Gradually Appeared] [29:Gradually Appeared] [30:Gradually Appeared] Primary Etiology: [28:Lymphedema] [29:Lymphedema] [30:Lymphedema] 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] [29:Lower Extremity] [30:Lower Extremity] Comorbid History: [28:Cataracts, Lymphedema, Cataracts, Lymphedema, Cataracts, Lymphedema, Arrhythmia, Congestive Heart Failure, Hypertension, Heart Failure, Hypertension, Heart Failure, Hypertension, Peripheral Venous Disease, Peripheral Venous Disease,  Peripheral Venous Disease, Type II Diabetes, Osteoarthritis, Neuropathy, Osteoarthritis, Neuropathy, Osteoarthritis, Neuropathy, Confinement Anxiety] [29:Arrhythmia, Congestive Type II Diabetes, Confinement Anxiety] [30:Arrhythmia, Congestive Type II  Diabetes, Confinement Anxiety] Date Acquired: [28:08/05/2018] [29:08/05/2018] [30:08/05/2018] Weeks of Treatment: [28:1] [29:1] [30:1] Wound Status: [28:Open] [29:Open] [30:Open] Clustered Wound: [28:No] [29:No] [30:No] Clustered Quantity: [28:N/A] [29:N/A] [30:N/A] Measurements L x W x D 3x12.5x0.1 [29:2x2x0.1] [30:1x1.7x0.1] (cm) Area (cm) : [28:29.452] [29:3.142] [30:1.335] Volume (cm) : [28:2.945] [29:0.314] [30:0.134] % Reduction in Area: [28:17.60%] [29:-77.80%] [30:-40.50%] % Reduction in Volume: 17.60% [29:-77.40%] [30:-41.10%] Classification: [28:Full Thickness Without Exposed Support StructuresExposed Support StructuresExposed Support Structures] [29:Full Thickness Without] [30:Full Thickness Without] Exudate Amount: [28:Medium] [29:Medium] [30:Medium] Exudate Type:  [28:Purulent] [29:Purulent] [30:Purulent] Exudate Color: [28:yellow, brown, green] [29:yellow, brown, green] [30:yellow, brown, green] Wound Margin: [28:Flat and Intact] [29:Flat and Intact] [30:Flat and Intact] Granulation Amount: [28:Medium (34-66%)] [29:Large (67-100%)] [30:Small (1-33%)] Granulation Quality: [28:Red] [29:Red] [30:Pink, Pale] Necrotic Amount: [28:Medium (34-66%)] [29:Small (1-33%)] [30:Large (67-100%)] Exposed Structures: [28:Fat Layer (Subcutaneous Fat Layer (Subcutaneous Fat Layer (Subcutaneous Tissue) Exposed: Yes Fascia: No Tendon: No Muscle: No Joint: No Bone: No] [29:Tissue) Exposed: Yes Fascia: No Tendon: No Muscle: No Joint: No Bone: No] [30:Tissue) Exposed: Yes  Fascia: No Tendon: No Muscle: No Joint: No Bone: No] Epithelialization: [28:Small (1-33%)] [29:None] [30:Small (1-33%)] Debridement: [28:N/A] [29:N/A] [30:Debridement - Excisional] Pre-procedure [28:N/A] [29:N/A] [30:09:25] Verification/Time Out Taken: Pain Control: [28:N/A] [29:N/A] [30:Lidocaine 5% topical ointment] Tissue Debrided: [28:N/A] [29:N/A] [30:Subcutaneous, Slough] Level: [28:N/A] [29:N/A] [30:Skin/Subcutaneous Tissue] Debridement Area (sq cm):N/A [29:N/A] [30:1.7] Instrument: [28:N/A] [29:N/A] [30:Curette] Bleeding: [28:N/A] [29:N/A] [30:Moderate] Hemostasis Achieved: [28:N/A] [29:N/A] [30:Pressure] Procedural Pain: [28:N/A] [29:N/A] [30:5] Post Procedural Pain: [28:N/A] [29:N/A] [30:0] Debridement Treatment N/A [29:N/A] [30:Procedure was tolerated] Response: [30:well] Post Debridement [28:N/A] [29:N/A] [30:1x1.7x0.1] Measurements L x W x D (cm) Post Debridement [28:N/A] [29:N/A] [30:0.134] Volume: (cm) Procedures Performed: Compression Therapy [28:31] [29:Compression Therapy 32] [30:Compression Therapy Debridement 33] Photos: [28:No Photos] [29:No Photos] [30:No Photos] Wound Location: [28:Right Lower Leg - Posterior, Left Lower Leg - Anterior Distal] [30:Left Lower Leg - Lateral,  Proximal] Wounding Event: [28:Gradually Appeared] [29:Gradually Appeared] [30:Gradually Appeared] Primary Etiology: [28:Lymphedema] [29:Lymphedema] [30:Lymphedema] 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] [29:Lower Extremity] [30:Lower Extremity] Comorbid History: [28:Cataracts, Lymphedema, Cataracts, Lymphedema, Cataracts, Lymphedema, Arrhythmia, Congestive Heart Failure, Hypertension, Heart Failure, Hypertension, Heart Failure, Hypertension, Peripheral Venous Disease, Peripheral Venous Disease,  Peripheral Venous Disease, Type II Diabetes, Osteoarthritis, Neuropathy, Osteoarthritis, Neuropathy, Osteoarthritis, Neuropathy, Confinement Anxiety] [29:Arrhythmia, Congestive Type II Diabetes, Confinement Anxiety] [30:Arrhythmia, Congestive Type II  Diabetes, Confinement Anxiety] Date Acquired: [28:08/05/2018] [29:08/05/2018] [30:08/05/2018] Weeks of Treatment: [28:1] [29:1] [30:1] Wound Status: [28:Open] [29:Open] [30:Open] Clustered Wound: [28:No] [29:No] [30:Yes] Clustered Quantity: [28:N/A] [29:N/A] [30:2] Measurements L x W x D 2x1.9x0.1 [29:1.5x1.7x0.1] [30:3.5x10x0.1] (cm) Area (cm) : [28:2.985] [29:2.003] [30:27.489] Volume (cm) : [28:0.298] [29:0.2] [30:2.749] % Reduction in Area: [28:-192.40%] [29:-54.60%] [30:20.50%] % Reduction in Volume: [28:-192.20%] [29:-53.80%] [30:20.50%] Classification: [28:Full Thickness Without Exposed Support Structures Exposed Support Structures Exposed Support Structures] [29:Full Thickness Without] [30:Full Thickness Without] Exudate Amount: [28:Medium] [29:Medium] [30:Medium] Exudate Type: [28:Purulent] [29:Purulent] [30:Purulent] Exudate Color: [28:yellow, brown, green] [29:yellow, brown, green] [30:yellow, brown, green] Wound Margin: [28:Flat and Intact] [29:Flat and Intact] [30:Flat and Intact] Granulation Amount: [28:Medium (34-66%)] [29:Small (1-33%)] [30:Medium  (34-66%)] Granulation Quality: [28:Red] [  29:Red] [30:Red, Pink] Necrotic Amount: [28:Medium (34-66%)] [29:Large (67-100%)] [30:Medium (34-66%)] Exposed Structures: [28:Fat Layer (Subcutaneous Fat Layer (Subcutaneous Fat Layer (Subcutaneous Tissue) Exposed: Yes Fascia: No Tendon: No Muscle: No Joint: No Bone: No] [29:Tissue) Exposed: Yes Fascia: No Tendon: No Muscle: No Joint: No Bone: No] [30:Tissue) Exposed: Yes  Fascia: No Tendon: No Muscle: No Joint: No Bone: No] Epithelialization: [28:None] [29:None] [30:Small (1-33%)] Debridement: [28:N/A] [29:Debridement - Excisional Debridement - Excisional] Pre-procedure [28:N/A] [29:09:25] [30:09:25] Verification/Time Out Taken: Pain Control: [28:N/A] [29:Lidocaine 5% topical ointment] [30:Lidocaine 5% topical ointment] Tissue Debrided: [28:N/A] [29:Subcutaneous, Slough] [30:Subcutaneous, Slough] Level: [28:N/A] [29:Skin/Subcutaneous Tissue] [30:Skin/Subcutaneous Tissue] Debridement Area (sq cm):N/A [29:2.55] [30:35] Instrument: [28:N/A] [29:Curette] [30:Curette] Bleeding: [28:N/A] [29:Moderate] [30:Moderate] Hemostasis Achieved: [28:N/A] [29:Pressure] [30:Pressure] Procedural Pain: [28:N/A] [29:5] [30:5] Post Procedural Pain: [28:N/A] [29:0] [30:0] Debridement Treatment N/A [29:Procedure was tolerated] [30:Procedure was tolerated] Response: [29:well] [30:well] Post Debridement [28:N/A] [29:1.5x1.7x0.1] [30:3.5x10x0.1] Measurements L x W x D (cm) Post Debridement [28:N/A] [29:0.2] [30:2.749] Volume: (cm) Procedures Performed: Compression Therapy [28:34] [29:Compression Therapy Debridement N/A] [30:Compression Therapy Debridement N/A] Photos: [28:No Photos] [29:N/A] [30:N/A] Wound Location: [28:Left Lower Leg - Lateral, Distal] [29:N/A] [30:N/A] Wounding Event: [28:Gradually Appeared] [29:N/A] [30:N/A] Primary Etiology: [28:Lymphedema] [29:N/A] [30:N/A] Secondary Etiology: [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]  [30:N/A] Comorbid History: [28:Cataracts, Lymphedema, N/A Arrhythmia, Congestive Heart Failure, Hypertension, Peripheral Venous Disease, Type II Diabetes, Osteoarthritis, Neuropathy, Confinement Anxiety] [30:N/A] Date Acquired: [28:01/10/2019] [29:N/A] [30:N/A] Weeks of Treatment: [28:1] [29:N/A] [30:N/A] Wound Status: [28:Open] [29:N/A] [30:N/A] Clustered Wound: [28:No] [29:N/A] [30:N/A] Clustered Quantity: [28:N/A] [29:N/A] [30:N/A] Measurements L x W x D [28:1.7x2x0.1] [29:N/A] [30:N/A] (cm) Area (cm) : [28:2.67] [29:N/A] [30:N/A] Volume (cm) : [id number] [29:N/A] [30:N/A] % Reduction in Area: [28:0.60%] [29:N/A] [30:N/A] % Reduction in Volume: [28:0.70%] [29:N/A] [30:N/A] Classification: [28:Full Thickness Without Exposed Support Structures] [29:N/A] [30:N/A] Exudate Amount: [28:Medium] [29:N/A] [30:N/A] Exudate Type: [28:Purulent] [29:N/A] [30:N/A] Exudate Color: [28:yellow, brown, green] [29:N/A] [30:N/A] Wound Margin: [28:Flat and Intact] [29:N/A] [30:N/A] Granulation Amount: [28:Large (67-100%)] [29:N/A] [30:N/A] Granulation Quality: [28:Red] [29:N/A] [30:N/A] Necrotic Amount: [28:Small (1-33%)] [29:N/A] [30:N/A] Exposed Structures: [28:Fat Layer (Subcutaneous N/A Tissue) Exposed: Yes Fascia: No Tendon: No Muscle: No Joint: No Bone: No] [30:N/A] Epithelialization: [28:None] [29:N/A] [30:N/A] Debridement: [28:Debridement - Excisional N/A] [30:N/A] Pre-procedure [28:09:25] [29:N/A] [30:N/A] Verification/Time Out Taken: Pain Control: [28:Lidocaine 5% topical ointment] [29:N/A] [30:N/A] Tissue Debrided: [28:Subcutaneous, Slough] [29:N/A] [30:N/A] Level: [28:Skin/Subcutaneous Tissue] [29:N/A] [30:N/A] Debridement Area (sq cm):3.4 [29:N/A] [30:N/A] Instrument: [28:Curette] [29:N/A] [30:N/A] Bleeding: [28:Moderate] [29:N/A] [30:N/A] Hemostasis Achieved: [28:Pressure] [29:N/A] [30:N/A] Procedural Pain: [28:5] [29:N/A] [30:N/A] Post Procedural Pain: [28:0] [29:N/A]  [30:N/A] Debridement Treatment Procedure was tolerated [29:N/A] [30:N/A] Response: [28:well] Post Debridement [28:1.7x2x0.1] [29:N/A] [30:N/A] Measurements L x W x D (cm) Post Debridement [id number] [29:N/A] [30:N/A] Volume: (cm) Procedures Performed: Compression Therapy [28:Debridement] [29:N/A] [30:N/A] Treatment Notes Electronic Signature(s) Signed: 01/17/2019 5:42:33 PM By: Linton Ham MD Signed: 03/14/2019 3:01:15 PM By: Carlene Coria RN Entered By: Linton Ham on 01/17/2019 09:57:30 -------------------------------------------------------------------------------- Multi-Disciplinary Care Plan Details Patient Name: Date of Service: [name] [name], [name] [name] 01/17/2019 8:45 AM Medical Record [id number] Patient Account Number: [id number] Date of Birth/Sex: Treating RN: [birth year] (73 y.o. [name] Primary Care Tilmon Wisehart: [name] Other Clinician: Referring Ranger Petrich: Treating Earland Reish/Extender:Robson, Tenna Child, [place] Si[place] in Treatment: 1 Active Inactive Abuse / Safety / Falls / Self Care Management Nursing Diagnoses: Impaired physical mobility Potential for falls Goals: Patient will not develop complications from immobility Date Initiated: 01/10/2019 Target Resolution Date: 02/10/2019 Goal Status: Active Patient will not experience any injury related to falls Date Initiated: 01/10/2019 Target  Resolution Date: 02/10/2019 Goal Status: Active Patient/caregiver will verbalize understanding of skin care regimen Date Initiated: 01/10/2019 Target Resolution Date: 02/10/2019 Goal Status: Active Interventions: Assess Activities of Daily Living upon admission and as needed Assess fall risk on admission and as needed Assess: immobility, friction, shearing, incontinence upon admission and as needed Assess impairment of mobility on admission and as needed per policy Assess personal safety and home safety (as indicated) on admission and as needed Assess  self care needs on admission and as needed Notes: Wound/Skin Impairment Nursing Diagnoses: Knowledge deficit related to ulceration/compromised skin integrity Goals: Patient/caregiver will verbalize understanding of skin care regimen Date Initiated: 01/10/2019 Target Resolution Date: 02/10/2019 Goal Status: Active Ulcer/skin breakdown will have a volume reduction of 30% by week 4 Date Initiated: 01/10/2019 Target Resolution Date: 02/10/2019 Goal Status: Active Interventions: Assess patient/caregiver ability to obtain necessary supplies Assess patient/caregiver ability to perform ulcer/skin care regimen upon admission and as needed Assess ulceration(s) every visit Notes: Electronic Signature(s) Signed: 03/14/2019 3:01:15 PM By: Carlene Coria RN Entered By: Carlene Coria on 01/17/2019 08:42:28 -------------------------------------------------------------------------------- Pain Assessment Details Patient Name: Date of Service: [name] [name], KALICH 01/17/2019 8:45 AM Medical Record [id number] Patient Account Number: [id number] Date of Birth/Sex: Treating RN: 08-Jul-1945 (73 y.o. Clearnce Sorrel Primary Care Jiana Lemaire: [name] Other Clinician: Referring Adria Costley: Treating Elita Dame/Extender:Robson, Tenna Child, [place] in Treatment: 1 Active Problems Location of Pain Severity and Description of Pain Patient Has Paino No Site Locations Pain Management and Medication Current Pain Management: Electronic Signature(s) Signed: 01/18/2019 5:37:20 PM By: Kela Millin Entered By: Kela Millin on 01/17/2019 08:24:58 -------------------------------------------------------------------------------- Patient/Caregiver Education Details Patient Name: Date of Service: LYNNAE, GOOS 10/13/2020andnbsp8:45 AM Medical Record [id number] Patient Account Number: [id number] Date of Birth/Gender: Treating RN: [birth year] (73 y.o. [name] Primary Care  Physician: [name] Other Clinician: Referring Physician: Treating Physician/Extender:Robson, Tenna Child, [place] Si[place] in Treatment: 1 Education Assessment Education Provided To: Patient Education Topics Provided Wound/Skin Impairment: Methods: Explain/Verbal Responses: State content correctly Electronic Signature(s) Signed: 03/14/2019 3:01:15 PM By: Carlene Coria RN Entered By: Carlene Coria on 01/17/2019 08:42:45 -------------------------------------------------------------------------------- Wound Assessment Details Patient Name: Date of Service: ANIDA, GLEATON 01/17/2019 8:45 AM Medical Record [id number] Patient Account Number: [id number] Date of Birth/Sex: Treating RN: 1946/02/07 (73 y.o. 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 Event: Gradually Appeared Etiology: Date Acquired: 08/05/2018 Wound Open Weeks Of Treatment: 1 Status: Clustered Wound: No Comorbid Cataracts, Lymphedema, Arrhythmia, History: Congestive Heart Failure, Hypertension, Peripheral Venous Disease, Type II Diabetes, Osteoarthritis, Neuropathy, Confinement Anxiety Photos Wound Measurements Length: (cm) 3 % Reduc Width: (cm) 12.5 % Reduc Depth: (cm) 0.1 Epithel Area: (cm) 29.452 Tunnel Volume: (cm) 2.945 Underm Wound Description Full Thickness Without Exposed Support Foul Od Classification: Structures Slough/ Wound Flat and Intact Margin: Exudate Medium Amount: Exudate Purulent Type: Exudate yellow, brown, green Color: Wound Bed Granulation Amount: Medium (34-66%) Granulation Quality: Red Fascia Necrotic Amount: Medium (34-66%) Fat Lay Necrotic  Quality: Adherent Slough Tendon Muscle Joint E Bone Ex or After Cleansing: No Fibrino Yes Exposed Structure Exposed: No er (Subcutaneous Tissue) Exposed: Yes Exposed: No Exposed: No xposed: No posed: No tion in Area: 17.6% tion in Volume: 17.6% ialization: Small (1-33%) ing: No ining: No Electronic Signature(s) Signed: 01/18/2019 5:37:20 PM By: Kela Millin Signed: 02/08/2019 4:01:42 PM By: Mikeal Hawthorne EMT/HBOT  Entered By: Mikeal Hawthorne on 01/18/2019 08:41:32 -------------------------------------------------------------------------------- Wound Assessment Details Patient Name: Date of Service: TAISHA, KNUCKLES 01/17/2019 8:45 AM Medical Record [id number] Patient Account Number: [id number] Date of Birth/Sex: Treating RN: [birth year] (73 y.o. 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 Event: Gradually Appeared Etiology: Date Acquired: 08/05/2018 Wound Open Weeks Of Treatment: 1 Status: Clustered Wound: No Comorbid Cataracts, Lymphedema, Arrhythmia, History: Congestive Heart Failure, Hypertension, Peripheral Venous Disease, Type II Diabetes, Osteoarthritis, Neuropathy, Confinement Anxiety Photos Wound Measurements Length: (cm) 2 % Reduct Width: (cm) 2 % Reduct Depth: (cm) 0.1 Epitheli Area: (cm) 3.142 Tunneli Volume: (cm) 0.314 Undermi Wound Description Full Thickness Without Exposed Support Foul Odo Classification: Structures Slough/F Wound Flat and Intact Margin: Exudate Medium Amount: Exudate Purulent Type: Exudate yellow, brown, green Color: Wound Bed Granulation Amount: Large (67-100%) Granulation Quality: Red Fascia  E Necrotic Amount: Small (1-33%) Fat Laye Necrotic Quality: Adherent Slough Tendon E Muscle E Joint Ex Bone Exp r After Cleansing: No ibrino Yes Exposed Structure xposed: No r (Subcutaneous Tissue) Exposed: Yes xposed: No xposed: No posed: No osed: No ion in Area: -77.8% ion in Volume: -77.4% alization: None ng: No ning: No Electronic Signature(s) Signed: 01/18/2019 5:37:20 PM By: Kela Millin Signed: 02/08/2019 4:01:42 PM By: Mikeal Hawthorne EMT/HBOT Entered By: Mikeal Hawthorne on 01/18/2019 08:40:56 -------------------------------------------------------------------------------- Wound Assessment Details Patient Name: Date of Service: Boesen, Danialle 01/17/2019 8:45 AM Medical Record [id number] Patient Account Number: [id number] Date of Birth/Sex: Treating RN: 1945/11/01 (73 y.o. 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 Event: Gradually Appeared Etiology: Date Acquired: 08/05/2018 Wound Open Weeks Of Treatment: 1 Status: Clustered Wound: No Comorbid Cataracts, Lymphedema, Arrhythmia, History: Congestive Heart Failure, Hypertension, Peripheral Venous Disease, Type II Diabetes, Osteoarthritis, Neuropathy, Confinement Anxiety Photos Wound Measurements Length: (cm) 1 % Reduc Width: (cm) 1.7 % Reduc Depth: (cm) 0.1 Epithel Area: (cm) 1.335 Tunnel Volume: (cm) 0.134 Underm Wound Description Full Thickness Without Exposed Support Classification: Structures Wound Flat and Intact Margin: Exudate Medium Amount: Exudate Purulent Type: Exudate yellow, brown, green Color: Wound Bed Granulation Amount: Small (1-33%) Granulation Quality:  Pink, Pale Necrotic Amount: Large (67-100%) Necrotic Quality: Adherent Slough Foul Odor After Cleansing: No Slough/Fibrino Yes Exposed Structure Fascia Exposed: No Fat Layer (Subcutaneous Tissue) Exposed: Yes Tendon Exposed: No Muscle Exposed: No Joint Exposed: No Bone Exposed: No tion in Area: -40.5% tion in Volume: -41.1% ialization: Small (1-33%) ing: No ining: No Electronic Signature(s) Signed: 01/18/2019 5:37:20 PM By: Kela Millin Signed: 02/08/2019 4:01:42 PM By: Mikeal Hawthorne EMT/HBOT Entered By: Mikeal Hawthorne on 01/18/2019 08:41:52 -------------------------------------------------------------------------------- Wound Assessment Details Patient Name: Date of Service: Mallin, Miles 01/17/2019 8:45 AM Medical Record [id number] Patient Account Number: [id number] Date of Birth/Sex: Treating RN: [birth year] (73 y.o. 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 Event: Gradually Appeared Etiology: Date Acquired: 08/05/2018 Wound Open Weeks Of Treatment: 1 Status: Clustered Wound: No Comorbid  Cataracts, Lymphedema, Arrhythmia, History: Congestive Heart Failure, Hypertension, Peripheral Venous Disease, Type II Diabetes, Osteoarthritis, Neuropathy, Confinement Anxiety Photos Wound Measurements Length: (cm) 2 % Redu Width: (cm) 1.9 % Redu Depth: (cm) 0.1 Epithe Area: (cm) 2.985 Tunne Volume: (cm) 0.298 Under Wound Description Full Thickness Without Exposed Support Foul Od Classification: Structures Slough/ Wound Flat and Intact Margin: Exudate Medium Amount: Exudate Purulent Type: Exudate yellow, brown, green Color: Wound  Bed Granulation Amount: Medium (34-66%) Granulation Quality: Red Fascia Necrotic Amount: Medium (34-66%) Fat Lay Necrotic Quality: Adherent Slough Tendon Muscle Joint E Bone Ex or After Cleansing: No Fibrino Yes Exposed Structure Exposed: No er (Subcutaneous Tissue) Exposed: Yes Exposed: No Exposed: No xposed: No posed: No ction in Area: -192.4% ction in Volume: -192.2% lialization: None ling: No mining: No Electronic Signature(s) Signed: 01/18/2019 5:37:20 PM By: Kela Millin Signed: 02/08/2019 4:01:42 PM By: Mikeal Hawthorne EMT/HBOT Entered By: Mikeal Hawthorne on 01/18/2019 08:41:14 -------------------------------------------------------------------------------- Wound Assessment Details Patient Name: Date of Service: Geiman, Kellyann 01/17/2019 8:45 AM Medical Record [id number] Patient Account Number: [id number] Date of Birth/Sex: Treating RN: 1946-01-13 (73 y.o. 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 Event: Gradually Appeared Etiology: Date Acquired: 08/05/2018 Wound Open Weeks Of Treatment: 1 Status: Clustered Wound: No Comorbid Cataracts, Lymphedema, Arrhythmia, History: Congestive Heart Failure, Hypertension, Peripheral Venous Disease, Type II Diabetes, Osteoarthritis, Neuropathy, Confinement Anxiety Photos Wound Measurements Length: (cm) 1.5 % Reduc Width: (cm) 1.7 % Reduc Depth: (cm) 0.1 Epithel Area: (cm) 2.003 Tunnel Volume: (cm) 0.2 Underm Wound Description Full Thickness Without Exposed Support Classification: Structures Wound Flat and Intact Margin: Exudate Medium Amount: Exudate Purulent Type: Exudate yellow, brown,  green Color: Wound Bed Granulation Amount: Small (1-33%) Granulation Quality: Red Necrotic Amount: Large (67-100%) Necrotic Quality: Adherent Slough Foul Odor After Cleansing: No Slough/Fibrino Yes Exposed Structure Fascia Exposed: No Fat Layer (Subcutaneous Tissue) Exposed: Yes Tendon Exposed: No Muscle Exposed: No Joint Exposed: No Bone Exposed: No tion in Area: -54.6% tion in Volume: -53.8% ialization: None ing: No ining: No Electronic Signature(s) Signed: 01/18/2019 5:37:20 PM By: Kela Millin Signed: 02/08/2019 4:01:42 PM By: Mikeal Hawthorne EMT/HBOT Entered By: Mikeal Hawthorne on 01/18/2019 08:40:31 -------------------------------------------------------------------------------- Wound Assessment Details Patient Name: Date of Service: Paci, Raiza 01/17/2019 8:45 AM Medical Record [id number] Patient Account Number: [id number] Date of Birth/Sex: Treating RN: 22-Sep-1945 (73 y.o. 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 Event: Gradually Appeared Etiology: Date Acquired: 08/05/2018 Wound Open Weeks Of Treatment: 1 Status: Clustered Wound: Yes Comorbid Cataracts, Lymphedema, Arrhythmia, History: Congestive Heart Failure, Hypertension, Peripheral Venous Disease, Type II Diabetes, Osteoarthritis, Neuropathy, Confinement Anxiety Photos Wound Measurements Length: (cm) 3.5 % Reduct Width: (cm) 10 % Reduct Depth: (cm) 0.1 Epitheli Clustered Quantity: 2 Tunnelin Area: (cm) 27.489 Undermi Volume: (cm) 2.749 Wound Description Classification: Full Thickness Without Exposed Support Foul Odo Structures Slough/F Wound Flat and  Intact Margin: Exudate Medium Amount: Exudate Purulent Type: Exudate yellow, brown, green Color: Wound Bed Granulation Amount: Medium (34-66%) Granulation Quality: Red, Pink Fascia E Necrotic Amount: Medium (34-66%) Fat Laye Necrotic Quality: Adherent Slough Tendon E Muscle E Joint Ex Bone  Exp r After Cleansing: No ibrino Yes Exposed Structure xposed: No r (Subcutaneous Tissue) Exposed: Yes xposed: No xposed: No posed: No osed: No ion in Area: 20.5% ion in Volume: 20.5% alization: Small (1-33%) g: No ning: No Electronic Signature(s) Signed: 01/18/2019 5:37:20 PM By: Kela Millin Signed: 02/08/2019 4:01:42 PM By: Mikeal Hawthorne EMT/HBOT Entered By: Mikeal Hawthorne on 01/18/2019 08:36:36 -------------------------------------------------------------------------------- Wound Assessment Details Patient Name: Date of Service: Hounshell, Simmie 01/17/2019 8:45 AM Medical Record [id number] Patient Account Number: [id number] Date of Birth/Sex: Treating RN: Aug 24, 1945 (73 y.o. 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 Event: Gradually Appeared Etiology: Date Acquired: 01/10/2019 Wound Open Weeks Of Treatment: 1 Status: Clustered Wound: No Comorbid Cataracts, Lymphedema, Arrhythmia, History: Congestive Heart Failure, Hypertension, Peripheral Venous Disease, Type II Diabetes, Osteoarthritis, Neuropathy, Confinement Anxiety Photos Wound Measurements Length: (cm) 1.7 % Reduc Width: (cm) 2 % Reduc Depth: (cm) 0.1 Epithel Area: (cm) 2.67 Tunnel Volume: (cm) 0.267 Underm Wound Description Classification: Full Thickness Without Exposed Support  Foul Od Structures Slough/ Wound Flat and Intact Margin: Exudate Medium Amount: Exudate Purulent Type: Exudate yellow, brown, green Color: Wound Bed Granulation Amount: Large (67-100%) Granulation Quality: Red Fascia Exp Necrotic Amount: Small (1-33%) Fat Layer Necrotic Quality: Adherent Slough Tendon Exp Muscle Exp Joint Expo Bone Expos or After Cleansing: No Fibrino Yes Exposed Structure osed: No (Subcutaneous Tissue) Exposed: Yes osed: No osed: No sed: No ed: No tion in Area: 0.6% tion in Volume: 0.7% ialization: None ing: No ining: No Electronic Signature(s) Signed: 01/18/2019 5:37:20 PM By: Kela Millin Signed: 02/08/2019 4:01:42 PM By: Mikeal Hawthorne EMT/HBOT Entered By: Mikeal Hawthorne on 01/18/2019 08:35:24 -------------------------------------------------------------------------------- Vitals Details Patient Name: Date of Service: Uhlig, Ronne 01/17/2019 8:45 AM Medical Record [id number] Patient Account Number: [id number] Date of Birth/Sex: Treating RN: 25-Jul-1945 (73 y.o. Clearnce Sorrel Primary Care Lashawnta Burgert: [name] Other Clinician: Referring Landis Dowdy: Treating Makelle Marrone/Extender:Robson, Tenna Child, [place] Siren in Treatment: 1 Vital Signs Time Taken: 08:24 Temperature (F): 97.7 Height (in): 71 Pulse (bpm): 68 Weight (lbs): 240 Respiratory Rate (breaths/min): 18 Body Mass Index (BMI): 33.5 Blood Pressure (mmHg): 142/64 Reference Range: 80 - 120 mg / dl Electronic Signature(s) Signed: 01/18/2019 5:37:20 PM By: Kela Millin Entered By: Kela Millin on 01/17/2019 08:24:51

## 2019-03-14 NOTE — Progress Notes (Signed)
[name] [name], [name] [name] ([id number]) Visit Report for 02/21/2019 Arrival Information Details Patient Name: Date of Service: [name] [name], [name] [name] 02/21/2019 1:45 PM Medical Record [id number] Patient Account Number: [id number] Date of Birth/Sex: Treating RN: 02-13-1946 (73 y.o. Clearnce Sorrel Primary Care Chidubem Chaires: [name] Other Clinician: Referring Ricky Doan: Treating Ethyn Schetter/Extender:Robson, Tenna Child, [place] Siren in Treatment: 6 Visit Information History Since Last Visit Walker Added or deleted any medications: No Patient Arrived: Any new allergies or adverse reactions: No Arrival Time: 14:42 Had a fall or experienced change in No Accompanied By: self activities of daily living that may affect Transfer Assistance: None risk of falls: Patient Identification Verified: Yes Signs or symptoms of abuse/neglect since last No Secondary Verification Process Completed: Yes visito Patient Requires Transmission-Based No Hospitalized since last visit: No Precautions: Implantable device outside of the clinic excluding No Patient Has Alerts: No cellular tissue based products placed in the center since last visit: Has Dressing in Place as Prescribed: Yes Has Compression in Place as Prescribed: Yes Pain Present Now: Yes Electronic Signature(s) Signed: 02/23/2019 6:05:31 PM By: Kela Millin Entered By: Kela Millin on 02/21/2019 14:43:04 -------------------------------------------------------------------------------- Compression Therapy Details Patient Name: Date of Service: [name] [name], [name] [name] 02/21/2019 1:45 PM Medical Record [id number] Patient Account Number: [id number] Date of Birth/Sex: Treating RN: [birth year] (73 y.o. [name] Primary Care Rhegan Trunnell: [name] Other Clinician: Referring Bergen Magner: Treating Jacier Gladu/Extender:Robson, Tenna Child, [place] Siren in Treatment: 6 Compression Therapy Performed for Wound  Wound #28 Right,Lateral Lower Leg Assessment: Performed By: Clinician Carlene Coria, RN Compression Type: Four Layer Post Procedure Diagnosis Same as Pre-procedure Electronic Signature(s) Signed: 03/14/2019 2:50:47 PM By: Carlene Coria RN Entered By: Carlene Coria on 02/21/2019 15:26:07 -------------------------------------------------------------------------------- Compression Therapy Details Patient Name: Date of Service: [name] [name], [name] [name] 02/21/2019 1:45 PM Medical Record [id number] Patient Account Number: [id number] Date of Birth/Sex: Treating RN: 02-19-1946 (73 y.o. [name] Primary Care Philicia Heyne: [name] Other Clinician: Referring Fatim Vanderschaaf: Treating Detrich Rakestraw/Extender:Robson, Tenna Child, [place] Siren in Treatment: 6 Compression Therapy Performed for Wound Wound #29 Right,Posterior Lower Leg Assessment: Performed By: Clinician Carlene Coria, RN Compression Type: Four Layer Post Procedure Diagnosis Same as Pre-procedure Electronic Signature(s) Signed: 03/14/2019 2:50:47 PM By: Carlene Coria RN Entered By: Carlene Coria on 02/21/2019 15:26:07 -------------------------------------------------------------------------------- Compression Therapy Details Patient Name: Date of Service: [name] [name], [name] [name] 02/21/2019 1:45 PM Medical Record [id number] Patient Account Number: [id number] Date of Birth/Sex: Treating RN: 02/08/1946 (73 y.o. [name] Primary Care Wisdom Rickey: [name] Other Clinician: Referring Jeneva Schweizer: Treating Daliah Chaudoin/Extender:Robson, Tenna Child, [place] Siren in Treatment: 6 Compression Therapy Performed for Wound Wound #31 Right,Distal,Posterior Lower Leg Assessment: Performed By: Clinician Carlene Coria, RN Compression Type: Four Layer Post Procedure Diagnosis Same as Pre-procedure Electronic Signature(s) Signed: 03/14/2019 2:50:47 PM By: Carlene Coria RN Entered By: Carlene Coria on 02/21/2019  15:26:07 -------------------------------------------------------------------------------- Encounter Discharge Information Details Patient Name: Date of Service: [name] [name], [name] [name] 02/21/2019 1:45 PM Medical Record [id number] Patient Account Number: [id number] Date of Birth/Sex: Treating RN: 06-Mar-1946 (73 y.o. Clearnce Sorrel Primary Care Adalind Weitz: [name] Other Clinician: Referring Melvena Vink: Treating Williard Keller/Extender:Robson, Tenna Child, [place] Siren in Treatment: 6 Encounter Discharge Information Items Discharge Condition: Stable Ambulatory Status: Walker Discharge Destination: Home Transportation: Other Accompanied By: self Schedule Follow-up Appointment: Yes Clinical Summary of Care: Patient Declined Electronic Signature(s) Signed: 02/23/2019 6:05:31 PM By: Kela Millin Entered By: Kela Millin on 02/21/2019 18:17:45 -------------------------------------------------------------------------------- Lower Extremity Assessment Details Patient Name: Date of Service: [name] [name], [name] [name] 02/21/2019 1:45 PM Medical Record [id number] Patient Account Number: [id number] Date of Birth/Sex: Treating RN: 05-25-1945 (73 y.o. F)  Kela Millin Primary Care Tawfiq Favila: [name] Other Clinician: Referring Saraann Enneking: Treating Vivek Grealish/Extender:Robson, Tenna Child, [place] Siren in Treatment: 6 Edema Assessment Assessed: [Left: No] [Right: No] Edema: [Left: Yes] [Right: Yes] Calf Left: Right: Point of Measurement: 30 cm From Medial Instep 55.4 cm 54 cm Ankle Left: Right: Point of Measurement: 8 cm From Medial Instep 33 cm 36.5 cm Vascular Assessment Pulses: Dorsalis Pedis Palpable: [Left:Yes] [Right:Yes] Electronic Signature(s) Signed: 02/23/2019 6:05:31 PM By: Kela Millin Entered By: Kela Millin on 02/21/2019 14:50:03 -------------------------------------------------------------------------------- Multi Wound  Chart Details Patient Name: Date of Service: [name] [name], [name] [name] 02/21/2019 1:45 PM Medical Record [id number] Patient Account Number: [id number] Date of Birth/Sex: Treating RN: 1945/06/04 (73 y.o. [name] Primary Care Amalya Salmons: [name] Other Clinician: Referring Babe Anthis: Treating Takeshi Teasdale/Extender:Robson, Tenna Child, [place] Siren in Treatment: 6 Vital Signs Height(in): 71 Pulse(bpm): 31 Weight(lbs): 240 Blood Pressure(mmHg): 179/83 Body Mass Index(BMI): 33 Temperature(F): 98.2 Respiratory 18 Rate(breaths/min): Photos: [28:No Photos] [29:No Photos] [31:No Photos] Wound Location: [28:Right Lower Leg - Lateral] [29:Right Lower Leg - Posterior Right Lower Leg - Posterior,] [31:Distal] Wounding Event: [28:Gradually Appeared] [29:Gradually Appeared] [31:Gradually Appeared] Primary Etiology: [28:Lymphedema] [29:Lymphedema] [31:Lymphedema] 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] [29:Lower Extremity] [31:Lower Extremity] Comorbid History: [28:Cataracts, Lymphedema, Cataracts, Lymphedema, Cataracts, Lymphedema, Arrhythmia, Congestive Heart Failure, Hypertension, Heart Failure, Hypertension, Heart Failure, Hypertension, Peripheral Venous Disease, Peripheral Venous Disease,  Peripheral Venous Disease, Type II Diabetes, Osteoarthritis, Neuropathy, Osteoarthritis, Neuropathy, Osteoarthritis, Neuropathy, Confinement Anxiety] [29:Arrhythmia, Congestive Type II Diabetes, Confinement Anxiety] [31:Arrhythmia, Congestive Type II  Diabetes, Confinement Anxiety] Date Acquired: [28:08/05/2018] [29:08/05/2018] [31:08/05/2018] Weeks of Treatment: [28:6] [29:6] [31:6] Wound Status: [28:Open] [29:Open] [31:Open] Clustered Quantity: [28:N/A] [29:N/A] [31:N/A] Measurements L x W x D 7x8x0.1 [29:0x0x0] [31:6x4x0.1] (cm) Area (cm) : [97:35.329] [29:0] [31:18.85] Volume (cm) : [28:4.398] [29:0]  [31:1.885] % Reduction in Area: [28:-23.10%] [29:100.00%] [31:-1746.20%] % Reduction in Volume: [28:-23.10%] [29:100.00%] [31:-1748.00%] Classification: [28:Full Thickness Without Exposed Support Structures Exposed Support Structures Exposed Support Structures] [29:Full Thickness Without] [31:Full Thickness Without] Exudate Amount: [28:Large] [29:None Present] [31:Large] Exudate Type: [28:Purulent] [29:N/A] [31:Purulent] Exudate Color: [28:yellow, brown, green] [29:N/A] [31:yellow, brown, green] Wound Margin: [28:Flat and Intact] [29:Flat and Intact] [31:Flat and Intact] Granulation Amount: [28:Large (67-100%)] [29:None Present (0%)] [31:Medium (34-66%)] Granulation Quality: [28:Pink] [29:N/A] [31:Red] Necrotic Amount: [28:Small (1-33%)] [29:None Present (0%)] [31:Small (1-33%)] Exposed Structures: [28:Fat Layer (Subcutaneous Fascia: No Tissue) Exposed: Yes Fascia: No Tendon: No Muscle: No Joint: No Bone: No] [29:Fat Layer (Subcutaneous Tissue) Exposed: Yes Tissue) Exposed: No Tendon: No Muscle: No Joint: No Bone: No] [31:Fat Layer (Subcutaneous  Fascia: No Tendon: No Muscle: No Joint: No Bone: No] Epithelialization: [28:Small (1-33%) 32] [29:Large (67-100%) 33] [31:Small (1-33%) 34] Photos: [28:No Photos] [29:No Photos] [31:No Photos] Wound Location: [28:Left Lower Leg - AnteriorLeft Lower Leg - Lateral,] [29:Proximal] [31:Left Lower Leg - Lateral, Distal] Wounding Event: [28:Gradually Appeared] [29:Gradually Appeared] [31:Gradually Appeared] Primary Etiology: [28:Lymphedema] [29:Lymphedema] [31:Lymphedema] 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] [29:Lower Extremity] [31:Lower Extremity] Comorbid History: [28:Cataracts, Lymphedema, Cataracts, Lymphedema, Cataracts, Lymphedema, Arrhythmia, Congestive Heart Failure, Hypertension, Heart Failure, Hypertension, Heart Failure, Hypertension, Peripheral Venous Disease,  Peripheral Venous Disease,  Peripheral Venous Disease, Type II Diabetes, Osteoarthritis, Neuropathy, Osteoarthritis, Neuropathy, Osteoarthritis, Neuropathy, Confinement Anxiety] [29:Arrhythmia, Congestive Type II Diabetes, Confinement Anxiety] [31:Arrhythmia, Congestive Type II  Diabetes, Confinement Anxiety] Date Acquired: [28:08/05/2018] [29:08/05/2018] [31:01/10/2019] Weeks of Treatment: [28:6] [29:6] [31:6] Wound Status: [28:Open] [29:Open] [31:Open] Clustered Quantity: [28:N/A] [29:2] [31:N/A] Measurements L  x W x D 3x3x0.1 [29:2.2x2.5x0.1] [31:1.3x1.8x0.1] (cm) Area (cm) : [17:6.160] [29:4.32] [31:1.838] Volume (cm) : [73:7.106] [29:0.432] [31:0.184] % Reduction in Area: [28:-445.40%] [29:87.50%] [31:31.60%] % Reduction in Volume: -443.80% [29:87.50%] [31:31.60%] Classification: [28:Full Thickness Without Exposed Support Structures Exposed Support Structures Exposed Support Structures] [29:Full Thickness Without] [31:Full Thickness Without] Exudate Amount: [28:Large] [29:Large] [31:Large] Exudate Type: [28:Serous] [29:Purulent] [31:Purulent] Exudate Color: [28:amber] [29:yellow, brown, green] [31:yellow, brown, green] Wound Margin: [28:Flat and Intact] [29:Flat and Intact] [31:Flat and Intact] Granulation Amount: [28:Large (67-100%)] [29:Large (67-100%)] [31:Small (1-33%)] Granulation Quality: [28:Red, Pink] [29:Red, Pink] [31:Red] Necrotic Amount: [28:Small (1-33%)] [29:Small (1-33%)] [31:Large (67-100%)] Exposed Structures: [28:Fat Layer (Subcutaneous Fat Layer (Subcutaneous Fat Layer (Subcutaneous Tissue) Exposed: Yes Fascia: No Tendon: No Muscle: No Joint: No Bone: No] [29:Tissue) Exposed: Yes Fascia: No Tendon: No Muscle: No Joint: No Bone: No] [31:Tissue) Exposed: Yes  Fascia: No Tendon: No Muscle: No Joint: No Bone: No] Epithelialization: [28:Large (67-100%) 36] [29:Small (1-33%)] [31:Small (1-33%) N/A N/A] Photos: [28:No Photos] [29:N/A] [31:N/A] Wound Location: [28:Left Lower  Leg - Posterior N/A] [31:N/A] Wounding Event: [28:Gradually Appeared] [29:N/A] [31:N/A] Primary Etiology: [28:Lymphedema] [29:N/A] [31:N/A] Secondary Etiology: [28:N/A] [29:N/A] [31:N/A] Comorbid History: [28:Cataracts, Lymphedema, N/A Arrhythmia, Congestive Heart Failure, Hypertension, Peripheral Venous Disease, Type II Diabetes, Osteoarthritis, Neuropathy, Confinement Anxiety] [31:N/A] Date Acquired: [28:01/26/2019] [29:N/A] [31:N/A] Weeks of Treatment: [28:1] [29:N/A] [31:N/A] Wound Status: [28:Open] [29:N/A] [31:N/A] Clustered Quantity: [28:N/A] [29:N/A] [31:N/A] Measurements L x W x D 2x3x0.1 [29:N/A] [31:N/A] (cm) Area (cm) : [26:9.485] [29:N/A] [31:N/A] Volume (cm) : [28:0.471] [29:N/A] [31:N/A] % Reduction in Area: [28:0.00%] [29:N/A] [31:N/A] % Reduction in Volume: 0.00% [29:N/A] [31:N/A] Classification: [28:Full Thickness Without Exposed Support Structures] [29:N/A] [31:N/A] Exudate Amount: [28:Large] [29:N/A] [31:N/A] Exudate Type: [28:Purulent] [29:N/A] [31:N/A] Exudate Color: [28:yellow, brown, green] [29:N/A] [31:N/A] Wound Margin: [28:Distinct, outline attached N/A] [31:N/A] Granulation Amount: [28:Large (67-100%)] [29:N/A] [31:N/A] Granulation Quality: [28:Pink, Pale] [29:N/A] [31:N/A] Necrotic Amount: [28:Small (1-33%)] [29:N/A] [31:N/A] Exposed Structures: [28:Fat Layer (Subcutaneous N/A Tissue) Exposed: Yes Fascia: No Tendon: No Muscle: No Joint: No Bone: No Small (1-33%)] [29:N/A] [31:N/A N/A] Treatment Notes Electronic Signature(s) Signed: 02/21/2019 6:07:35 PM By: Linton Ham MD Signed: 03/14/2019 2:50:47 PM By: Carlene Coria RN Entered By: Linton Ham on 02/21/2019 15:22:39 -------------------------------------------------------------------------------- Multi-Disciplinary Care Plan Details Patient Name: Date of Service: [name] [name], [name] [name] 02/21/2019 1:45 PM Medical Record [id number] Patient Account Number: [id number] Date of Birth/Sex: Treating  RN: 27-Apr-1945 (73 y.o. [name] Primary Care Elliona Doddridge: [name] Other Clinician: Referring Fallon Haecker: Treating Ellen Mayol/Extender:Robson, Tenna Child, [place] Siren in Treatment: 6 Active Inactive Abuse / Safety / Falls / Self Care Management Nursing Diagnoses: Impaired physical mobility Potential for falls Goals: Patient will not develop complications from immobility Date Initiated: 01/10/2019 Date Inactivated: 02/14/2019 Target Resolution Date: 02/10/2019 Goal Status: Met Patient will not experience any injury related to falls Date Initiated: 01/10/2019 Date Inactivated: 02/14/2019 Target Resolution Date: 02/10/2019 Goal Status: Met Patient/caregiver will verbalize understanding of skin care regimen Date Initiated: 01/10/2019 Target Resolution Date: 03/10/2019 Goal Status: Active Interventions: Assess Activities of Daily Living upon admission and as needed Assess fall risk on admission and as needed Assess: immobility, friction, shearing, incontinence upon admission and as needed Assess impairment of mobility on admission and as needed per policy Assess personal safety and home safety (as indicated) on admission and as needed Assess self care needs on admission and as needed Notes: Wound/Skin Impairment Nursing Diagnoses: Knowledge deficit related to ulceration/compromised skin integrity Goals: Patient/caregiver will verbalize understanding of skin care regimen Date Initiated: 01/10/2019 Target Resolution Date: 03/10/2019  Goal Status: Active Ulcer/skin breakdown will have a volume reduction of 30% by week 4 Target Resolution Date Initiated: 01/10/2019 Date Inactivated: 02/14/2019 Date: 02/10/2019 Unmet Goal Status: Unmet Reason: comorbities/lymphedema Ulcer/skin breakdown will have a volume reduction of 50% by week 8 Date Initiated: 02/14/2019 Target Resolution Date: 03/17/2019 Goal Status: Active Interventions: Assess patient/caregiver ability to  obtain necessary supplies Assess patient/caregiver ability to perform ulcer/skin care regimen upon admission and as needed Assess ulceration(s) every visit Notes: Electronic Signature(s) Signed: 03/14/2019 2:50:47 PM By: Carlene Coria RN Entered By: Carlene Coria on 02/21/2019 14:15:03 -------------------------------------------------------------------------------- Pain Assessment Details Patient Name: Date of Service: [name] [name], [name] [name] 02/21/2019 1:45 PM Medical Record [id number] Patient Account Number: [id number] Date of Birth/Sex: Treating RN: 1945-04-10 (73 y.o. Clearnce Sorrel Primary Care Colbey Wirtanen: [name] Other Clinician: Referring Sharlotte Baka: Treating Karthik Whittinghill/Extender:Robson, Tenna Child, [place] Siren in Treatment: 6 Active Problems Location of Pain Severity and Description of Pain Patient Has Paino Yes Site Locations Pain Location: Pain in Ulcers With Dressing Change: Yes Duration of the Pain. Constant / Intermittento Constant Rate the pain. Current Pain Level: 6 Worst Pain Level: 8 Least Pain Level: 4 Tolerable Pain Level: 5 Pain Management and Medication Current Pain Management: Electronic Signature(s) Signed: 02/23/2019 6:05:31 PM By: Kela Millin Entered By: Kela Millin on 02/21/2019 14:44:27 -------------------------------------------------------------------------------- Patient/Caregiver Education Details Patient Name: Date of Service: [name] [name], [name] [name] 11/17/2020andnbsp1:45 PM Medical Record [id number] Patient Account Number: [id number] Date of Birth/Gender: Treating RN: December 18, 1945 (73 y.o. [name] Primary Care Physician: [name] Other Clinician: Referring Physician: Treating Physician/Extender:Robson, Tenna Child, [place] Siren in Treatment: 6 Education Assessment Education Provided To: Patient Education Topics Provided Electronic Signature(s) Signed: 03/14/2019 2:50:47 PM By:  Carlene Coria RN Entered By: Carlene Coria on 02/21/2019 14:22:38 -------------------------------------------------------------------------------- Wound Assessment Details Patient Name: Date of Service: [name] [name], [name] [name] 02/21/2019 1:45 PM Medical Record [id number] Patient Account Number: [id number] Date of Birth/Sex: Treating RN: July 20, 1945 (73 y.o. Clearnce Sorrel Primary Care Filiberto Wamble: [name] Other Clinician: Referring Nakoma Gotwalt: Treating Phaedra Colgate/Extender:Robson, Tenna Child, [place] Siren in Treatment: 6 Wound Status Wound Number: 28 Primary Lymphedema Etiology: Wound Location: Right Lower Leg - Lateral Secondary Diabetic Wound/Ulcer of the Lower Extremity Wounding Event: Gradually Appeared Etiology: Date Acquired: 08/05/2018 Wound Open Weeks Of Treatment: 6 Status: Clustered Wound: No Comorbid Cataracts, Lymphedema, Arrhythmia, History: Congestive Heart Failure, Hypertension, Peripheral Venous Disease, Type II Diabetes, Osteoarthritis, Neuropathy, Confinement Anxiety Photos Wound Measurements Length: (cm) 7 % Reduc Width: (cm) 8 % Reduc Depth: (cm) 0.1 Epithel Area: (cm) 43.982 Tunnel Volume: (cm) 4.398 Underm Wound Description Classification: Full Thickness Without Exposed Support Foul Od Structures Slough/ Wound Flat and Intact Margin: Exudate Large Amount: Exudate Purulent Type: Exudate yellow, brown, green Color: Wound Bed Granulation Amount: Large (67-100%) Granulation Quality: Pink Fascia Necrotic Amount: Small (1-33%) Fat Lay Necrotic Quality: Adherent Slough Tendon Muscle Joint E Bone Ex or After Cleansing: No Fibrino Yes Exposed Structure Exposed: No er (Subcutaneous Tissue) Exposed: Yes Exposed: No Exposed: No xposed: No posed: No tion in Area: -23.1% tion in Volume: -23.1% ialization: Small (1-33%) ing: No ining: No Electronic Signature(s) Signed: 02/23/2019 6:05:31 PM By: Kela Millin Signed:  02/24/2019 4:45:58 PM By: Mikeal Hawthorne EMT/HBOT Entered By: Mikeal Hawthorne on 02/23/2019 09:30:30 -------------------------------------------------------------------------------- Wound Assessment Details Patient Name: Date of Service: [name] [name], Jeriyah 02/21/2019 1:45 PM Medical Record [id number] Patient Account Number: [id number] Date of Birth/Sex: Treating RN: [birth year] (73 y.o. Clearnce Sorrel Primary Care Ryann Leavitt: [name] Other Clinician: Referring Emmilia Sowder: Treating Claudett Bayly/Extender:Robson, Tenna Child, [place] Siren in Treatment: 6 Wound  Status Wound Number: 29 Primary Lymphedema Etiology: Wound Location: Right Lower Leg - Posterior Secondary Diabetic Wound/Ulcer of the Lower Extremity Wounding Event: Gradually Appeared Etiology: Date Acquired: 08/05/2018 Wound Open Weeks Of Treatment: 6 Status: Clustered Wound: No Comorbid Cataracts, Lymphedema, Arrhythmia, History: Congestive Heart Failure, Hypertension, Peripheral Venous Disease, Type II Diabetes, Osteoarthritis, Neuropathy, Confinement Anxiety Wound Measurements Length: (cm) 0 % Redu Width: (cm) 0 % Redu Depth: (cm) 0 Epithe Area: (cm) 0 Tunne Volume: (cm) 0 Under Wound Description Classification: Full Thickness Without Exposed Support Structures Wound Flat and Intact Margin: Exudate None Present Amount: Wound Bed Granulation Amount: None Present (0%) Necrotic Amount: None Present (0%) Foul Odor After Cleansing: No Slough/Fibrino No Exposed Structure Fascia Exposed: No Fat Layer (Subcutaneous Tissue) Exposed: No Tendon Exposed: No Muscle Exposed: No Joint Exposed: No Bone Exposed: No ction in Area: 100% ction in Volume: 100% lialization: Large (67-100%) ling: No mining: No Electronic Signature(s) Signed: 02/23/2019 6:05:31 PM By: Kela Millin Entered By: Kela Millin on 02/21/2019  14:57:27 -------------------------------------------------------------------------------- Wound Assessment Details Patient Name: Date of Service: TIMBERLYNN, KIZZIAH 02/21/2019 1:45 PM Medical Record [id number] Patient Account Number: [id number] Date of Birth/Sex: Treating RN: 1945-08-03 (73 y.o. Clearnce Sorrel Primary Care Taren Dymek: [name] Other Clinician: Referring Dawana Asper: Treating Unika Nazareno/Extender:Robson, Tenna Child, [place] Siren in Treatment: 6 Wound Status Wound Number: 31 Primary Lymphedema Etiology: Wound Location: Right Lower Leg - Posterior, Distal Secondary Diabetic Wound/Ulcer of the Lower Extremity Wounding Event: Gradually Appeared Etiology: Date Acquired: 08/05/2018 Wound Open Weeks Of Treatment: 6 Status: Clustered Wound: No Comorbid Cataracts, Lymphedema, Arrhythmia, History: Congestive Heart Failure, Hypertension, Peripheral Venous Disease, Type II Diabetes, Osteoarthritis, Neuropathy, Confinement Anxiety Photos Wound Measurements Length: (cm) 6 % Reduc Width: (cm) 4 % Reduc Depth: (cm) 0.1 Epithel Area: (cm) 18.85 Tunnel Volume: (cm) 1.885 Underm Wound Description Classification: Full Thickness Without Exposed Support Foul Od Structures Slough/ Wound Flat and Intact Margin: Exudate Large Amount: Exudate Purulent Type: Exudate yellow, brown, green Color: Wound Bed Granulation Amount: Medium (34-66%) Granulation Quality: Red Fascia Necrotic Amount: Small (1-33%) Fat Lay Necrotic Quality: Adherent Slough Tendon Muscle Joint E Bone Ex or After Cleansing: No Fibrino Yes Exposed Structure Exposed: No er (Subcutaneous Tissue) Exposed: Yes Exposed: No Exposed: No xposed: No posed: No tion in Area: -1746.2% tion in Volume: -1748% ialization: Small (1-33%) ing: No ining: No Electronic Signature(s) Signed: 02/23/2019 6:05:31 PM By: Kela Millin Signed: 02/24/2019 4:45:58 PM By: Mikeal Hawthorne  EMT/HBOT Entered By: Mikeal Hawthorne on 02/23/2019 09:30:55 -------------------------------------------------------------------------------- Wound Assessment Details Patient Name: Date of Service: Wierenga, Lyndzie 02/21/2019 1:45 PM Medical Record [id number] Patient Account Number: [id number] Date of Birth/Sex: Treating RN: Apr 12, 1945 (73 y.o. Clearnce Sorrel Primary Care Lanita Stammen: [name] Other Clinician: Referring [employer]: Treating Donnelle Rubey/Extender:Robson, Tenna Child, [place] Siren in Treatment: 6 Wound Status Wound Number: 32 Primary Lymphedema Etiology: Wound Location: Left Lower Leg - Anterior Secondary Diabetic Wound/Ulcer of the Lower Extremity Wounding Event: Gradually Appeared Etiology: Date Acquired: 08/05/2018 Wound Open Weeks Of Treatment: 6 Status: Clustered Wound: No Comorbid Cataracts, Lymphedema, Arrhythmia, History: Congestive Heart Failure, Hypertension, Peripheral Venous Disease, Type II Diabetes, Osteoarthritis, Neuropathy, Confinement Anxiety Photos Wound Measurements Length: (cm) 3 % Reduct Width: (cm) 3 % Reduct Depth: (cm) 0.1 Epitheli Area: (cm) 7.069 Tunneli Volume: (cm) 0.707 Undermi Wound Description Classification: Full Thickness Without Exposed Support Foul Odo Structures Slough/F Wound Flat and Intact Margin: Exudate Large Amount: Exudate Serous Type: Exudate amber Color: Wound Bed Granulation Amount: Large (67-100%) Granulation Quality: Red, Pink Fascia E Necrotic Amount:  Small (1-33%) Fat Laye Necrotic Quality: Adherent Slough Tendon E Muscle E Joint Ex Bone Exp r After Cleansing: No ibrino Yes Exposed Structure xposed: No r (Subcutaneous Tissue) Exposed: Yes xposed: No xposed: No posed: No osed: No ion in Area: -445.4% ion in Volume: -443.8% alization: Large (67-100%) ng: No ning: No Electronic Signature(s) Signed: 02/23/2019 6:05:31 PM By: Kela Millin Signed: 02/24/2019  4:45:58 PM By: Mikeal Hawthorne EMT/HBOT Entered By: Mikeal Hawthorne on 02/23/2019 09:30:08 -------------------------------------------------------------------------------- Wound Assessment Details Patient Name: Date of Service: Nickless, Kaicee 02/21/2019 1:45 PM Medical Record [id number] Patient Account Number: [id number] Date of Birth/Sex: Treating RN: Jun 14, 1945 (73 y.o. 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 Event: Gradually Appeared Etiology: Date Acquired: 08/05/2018 Wound Open Weeks Of Treatment: 6 Status: Clustered Wound: No Comorbid Cataracts, Lymphedema, Arrhythmia, History: Congestive Heart Failure, Hypertension, Peripheral Venous Disease, Type II Diabetes, Osteoarthritis, Neuropathy, Confinement Anxiety Photos Wound Measurements Length: (cm) 2.2 % Reduc Width: (cm) 2.5 % Reduc Depth: (cm) 0.1 Epithel Clustered Quantity: 2 Tunneli Area: (cm) 4.32 Underm Volume: (cm) 0.432 Wound Description Full Thickness Without Exposed Support Foul Od Classification: Structures Slough/ Wound Flat and Intact Margin: Exudate Large Amount: Exudate Purulent Type: Exudate yellow, brown, green Color: Wound Bed Granulation Amount: Large (67-100%) Granulation Quality: Red, Pink Fascia Ex Necrotic Amount: Small (1-33%) Fat Layer Necrotic Quality: Adherent Slough Tendon Ex Muscle Ex Joint Exp Bone Expo or After Cleansing: No Fibrino Yes Exposed Structure posed: No (Subcutaneous Tissue) Exposed: Yes posed: No posed: No osed: No sed: No tion in Area: 87.5% tion in Volume: 87.5% ialization: Small (1-33%) ng: No ining: No Electronic  Signature(s) Signed: 02/23/2019 6:05:31 PM By: Kela Millin Signed: 02/24/2019 4:45:58 PM By: Mikeal Hawthorne EMT/HBOT Entered By: Mikeal Hawthorne on 02/23/2019 09:27:33 -------------------------------------------------------------------------------- Wound Assessment Details Patient Name: Date of Service: Speiser, Azari 02/21/2019 1:45 PM Medical Record [id number] Patient Account Number: [id number] Date of Birth/Sex: Treating RN: [birth year] (73 y.o. Clearnce Sorrel Primary Care Nagi Furio: [name] Other Clinician: Referring Timera Windt: Treating Kahleb Mcclane/Extender:Robson, Tenna Child, [place] Siren in Treatment: 6 Wound Status Wound Number: 34 Primary Lymphedema Etiology: Wound Location: Left Lower Leg - Lateral, Distal Secondary Diabetic Wound/Ulcer of the Lower Extremity Wounding Event: Gradually Appeared Etiology: Date Acquired: 01/10/2019 Wound Open Weeks Of Treatment: 6 Status: Clustered Wound: No Comorbid Cataracts, Lymphedema, Arrhythmia, History: Congestive Heart Failure, Hypertension, Peripheral Venous Disease, Type II Diabetes, Osteoarthritis, Neuropathy, Confinement Anxiety Photos Wound Measurements Length: (cm) 1.3 % Reducti Width: (cm) 1.8 % Reducti Depth: (cm) 0.1 Epithelia Area: (cm) 1.838 Tunnelin Volume: (cm) 0.184 Undermin Wound Description Classification: Full Thickness Without Exposed Support Foul Od Structures Slough/ Wound Flat and Intact Margin: Exudate Large Amount: Exudate Purulent Type: Exudate yellow, brown, green Color: Wound Bed Granulation Amount: Small (1-33%) Granulation Quality: Red Fascia Necrotic Amount: Large (67-100%) Fat Lay Necrotic Quality: Adherent Slough Tendon Muscle Joint E Bone Ex or After Cleansing: No Fibrino Yes Exposed Structure Exposed: No er (Subcutaneous Tissue) Exposed: Yes Exposed: No Exposed: No xposed: No posed: No on in Area: 31.6% on in Volume: 31.6% lization:  Small (1-33%) g: No ing: No Electronic Signature(s) Signed: 02/23/2019 6:05:31 PM By: Kela Millin Signed: 02/24/2019 4:45:58 PM By: Mikeal Hawthorne EMT/HBOT Entered By: Mikeal Hawthorne on 02/23/2019 09:22:54 -------------------------------------------------------------------------------- Wound Assessment Details Patient Name: Date of Service: Tavella, Santita  02/21/2019 1:45 PM Medical Record [id number] Patient Account Number: [id number] Date of Birth/Sex: Treating RN: Aug 20, 1945 (73 y.o. Clearnce Sorrel Primary Care Kalah Pflum: [name] Other Clinician: Referring Saralee Bolick: Treating Churchill Grimsley/Extender:Robson, Tenna Child, [place] Siren in Treatment: 6 Wound Status Wound Number: 36 Primary Lymphedema Etiology: Wound Location: Left Lower Leg - Posterior Wound Open Wounding Event: Gradually Appeared Status: Date Acquired: 01/26/2019 Comorbid Cataracts, Lymphedema, Arrhythmia, Weeks Of Treatment: 1 History: Congestive Heart Failure, Hypertension, Clustered Wound: No Peripheral Venous Disease, Type II Diabetes, Osteoarthritis, Neuropathy, Confinement Anxiety Photos Wound Measurements Length: (cm) 2 % Reduc Width: (cm) 3 % Reduc Depth: (cm) 0.1 Epithel Area: (cm) 4.712 Tunnel Volume: (cm) 0.471 Underm Wound Description Full Thickness Without Exposed Support Foul Od Classification: Structures Slough/ Wound Distinct, outline attached Margin: Exudate Large Amount: Exudate Purulent Type: Exudate yellow, brown, green Color: Wound Bed Granulation Amount: Large (67-100%) Granulation Quality: Pink, Pale Fascia Necrotic Amount: Small (1-33%) Fat Lay Necrotic Quality: Adherent Slough Tendon Muscle Joint E Bone Ex or After Cleansing: No Fibrino Yes Exposed Structure Exposed: No er (Subcutaneous Tissue) Exposed: Yes Exposed: No Exposed: No xposed: No posed: No tion in Area: 0% tion in Volume: 0% ialization: Small (1-33%) ing:  No ining: No Electronic Signature(s) Signed: 02/23/2019 6:05:31 PM By: Kela Millin Signed: 02/24/2019 4:45:58 PM By: Mikeal Hawthorne EMT/HBOT Entered By: Mikeal Hawthorne on 02/23/2019 09:15:03 -------------------------------------------------------------------------------- Vitals Details Patient Name: Date of Service: Verhoeven, Makensey 02/21/2019 1:45 PM Medical Record [id number] Patient Account Number: [id number] Date of Birth/Sex: Treating RN: 12-06-1945 (73 y.o. Clearnce Sorrel Primary Care Jennylee Uehara: [name] Other Clinician: Referring Brydan Downard: Treating Cambrea Kirt/Extender:Robson, Tenna Child, [place] Siren in Treatment: 6 Vital Signs Time Taken: 14:43 Temperature (F): 98.2 Height (in): 71 Pulse (bpm): 73 Weight (lbs): 240 Respiratory Rate (breaths/min): 18 Body Mass Index (BMI): 33.5 Blood Pressure (mmHg): 179/83 Reference Range: 80 - 120 mg / dl Electronic Signature(s) Signed: 02/23/2019 6:05:31 PM By: Kela Millin Entered By: Kela Millin on 02/21/2019 14:43:49

## 2019-03-14 NOTE — Progress Notes (Signed)
[name] [name], [name] [name] ([id number]) Visit Report for 01/31/2019 Arrival Information Details Patient Name: Date of Service: [name] [name], [name] [name] 01/31/2019 8:15 AM Medical Record [id number] Patient Account Number: [id number] Date of Birth/Sex: Treating RN: Dec 18, 1945 (73 y.o. Debby Bud Primary Care Tristian Bouska: [name] Other Clinician: Referring Milania Haubner: Treating Adar Rase/Extender:Robson, Tenna Child, [place] Siren in Treatment: 3 Visit Information History Since Last Visit Walker Added or deleted any medications: No Patient Arrived: Any new allergies or adverse reactions: No Arrival Time: 08:10 Had a fall or experienced change in No Accompanied By: self activities of daily living that may affect Transfer Assistance: None risk of falls: Patient Identification Verified: Yes Signs or symptoms of abuse/neglect since last No Secondary Verification Process Completed: Yes visito Patient Requires Transmission-Based No Hospitalized since last visit: No Precautions: Implantable device outside of the clinic excluding No Patient Has Alerts: No cellular tissue based products placed in the center since last visit: Has Dressing in Place as Prescribed: Yes Has Compression in Place as Prescribed: Yes Pain Present Now: Yes Electronic Signature(s) Signed: 01/31/2019 5:02:12 PM By: Deon Pilling Entered By: Deon Pilling on 01/31/2019 08:14:42 -------------------------------------------------------------------------------- Compression Therapy Details Patient Name: Date of Service: [name] [name], [name] [name] 01/31/2019 8:15 AM Medical Record [id number] Patient Account Number: [id number] Date of Birth/Sex: Treating RN: December 30, 1945 (73 y.o. [name] Primary Care Elsey Holts: [name] Other Clinician: Referring Ginnie Marich: Treating Kailand Seda/Extender:Robson, Tenna Child, [place] Siren in Treatment: 3 Compression Therapy Performed for Wound Wound #28  Right,Lateral Lower Leg Assessment: Performed By: Clinician Carlene Coria, RN Compression Type: Four Layer Post Procedure Diagnosis Same as Pre-procedure Electronic Signature(s) Signed: 03/14/2019 3:01:15 PM By: Carlene Coria RN Entered By: Carlene Coria on 01/31/2019 09:20:46 -------------------------------------------------------------------------------- Compression Therapy Details Patient Name: Date of Service: [name] [name], [name] [name] 01/31/2019 8:15 AM Medical Record [id number] Patient Account Number: [id number] Date of Birth/Sex: Treating RN: 09-27-1945 (73 y.o. [name] Primary Care Darnetta Kesselman: [name] Other Clinician: Referring Alexus Michael: Treating Dondra Rhett/Extender:Robson, Tenna Child, [place] Siren in Treatment: 3 Compression Therapy Performed for Wound Wound #29 Right,Posterior Lower Leg Assessment: Performed By: Clinician Carlene Coria, RN Compression Type: Four Layer Post Procedure Diagnosis Same as Pre-procedure Electronic Signature(s) Signed: 03/14/2019 3:01:15 PM By: Carlene Coria RN Entered By: Carlene Coria on 01/31/2019 09:20:46 -------------------------------------------------------------------------------- Compression Therapy Details Patient Name: Date of Service: [name] [name], [name] [name] 01/31/2019 8:15 AM Medical Record [id number] Patient Account Number: [id number] Date of Birth/Sex: Treating RN: 28-Apr-1945 (73 y.o. [name] Primary Care Kamauri Kathol: [name] Other Clinician: Referring Loralyn Rachel: Treating Kriston Pasquarello/Extender:Robson, Tenna Child, [place] Siren in Treatment: 3 Compression Therapy Performed for Wound Wound #30 Right,Distal,Lateral Lower Leg Assessment: Performed By: Clinician Carlene Coria, RN Compression Type: Four Layer Post Procedure Diagnosis Same as Pre-procedure Electronic Signature(s) Signed: 03/14/2019 3:01:15 PM By: Carlene Coria RN Entered By: Carlene Coria on 01/31/2019  09:20:46 -------------------------------------------------------------------------------- Compression Therapy Details Patient Name: Date of Service: [name] [name], [name] [name] 01/31/2019 8:15 AM Medical Record [id number] Patient Account Number: [id number] Date of Birth/Sex: Treating RN: [birth year] (73 y.o. [name] Primary Care Dathan Attia: [name] Other Clinician: Referring Montia Haslip: Treating Lysbeth Dicola/Extender:Robson, Tenna Child, [place] Si[place] in Treatment: 3 Compression Therapy Performed for Wound Wound #31 Right,Distal,Posterior Lower Leg Assessment: Performed By: Clinician Carlene Coria, RN Compression Type: Four Layer Post Procedure Diagnosis Same as Pre-procedure Electronic Signature(s) Signed: 03/14/2019 3:01:15 PM By: Carlene Coria RN Entered By: Carlene Coria on 01/31/2019 09:20:46 -------------------------------------------------------------------------------- Compression Therapy Details Patient Name: Date of Service: [name] [name], [name] [name] 01/31/2019 8:15 AM Medical Record [id number] Patient Account Number: [id number] Date of Birth/Sex: Treating RN: 1946-02-23 (73 y.o. F) Epps, Morey Hummingbird  Primary Care Oris Calmes: [name] Other Clinician: Referring Charlise Giovanetti: Treating Alima Naser/Extender:Robson, Tenna Child, [place] Si[place] in Treatment: 3 Compression Therapy Performed for Wound Wound #32 Left,Anterior Lower Leg Assessment: Performed By: Clinician Carlene Coria, RN Compression Type: Four Layer Post Procedure Diagnosis Same as Pre-procedure Electronic Signature(s) Signed: 03/14/2019 3:01:15 PM By: Carlene Coria RN Entered By: Carlene Coria on 01/31/2019 09:20:47 -------------------------------------------------------------------------------- Compression Therapy Details Patient Name: Date of Service: [name] [name], [name] [name] 01/31/2019 8:15 AM Medical Record [id number] Patient Account Number: [id number] Date of Birth/Sex: Treating  RN: 1945-09-17 (73 y.o. [name] Primary Care Edinson Domeier: [name] Other Clinician: Referring Benny Henrie: Treating Cody Oliger/Extender:Robson, Tenna Child, [place] Si[place] in Treatment: 3 Compression Therapy Performed for Wound Wound #33 Left,Proximal,Lateral Lower Leg Assessment: Performed By: Clinician Carlene Coria, RN Compression Type: Four Layer Post Procedure Diagnosis Same as Pre-procedure Electronic Signature(s) Signed: 03/14/2019 3:01:15 PM By: Carlene Coria RN Entered By: Carlene Coria on 01/31/2019 09:20:47 -------------------------------------------------------------------------------- Compression Therapy Details Patient Name: Date of Service: [name] [name], [name] [name] 01/31/2019 8:15 AM Medical Record [id number] Patient Account Number: [id number] Date of Birth/Sex: Treating RN: 03-18-1946 (73 y.o. [name] Primary Care Draco Malczewski: [name] Other Clinician: Referring Ericha Whittingham: Treating Dalphine Cowie/Extender:Robson, Tenna Child, [place] Si[place] in Treatment: 3 Compression Therapy Performed for Wound Wound #34 Left,Distal,Lateral Lower Leg Assessment: Performed By: Clinician Carlene Coria, RN Compression Type: Four Layer Post Procedure Diagnosis Same as Pre-procedure Electronic Signature(s) Signed: 03/14/2019 3:01:15 PM By: Carlene Coria RN Entered By: Carlene Coria on 01/31/2019 09:20:47 -------------------------------------------------------------------------------- Encounter Discharge Information Details Patient Name: Date of Service: [name] [name], [name] [name] 01/31/2019 8:15 AM Medical Record [id number] Patient Account Number: [id number] Date of Birth/Sex: Treating RN: January 14, 1946 (73 y.o. Clearnce Sorrel Primary Care Ronneisha Jett: [name] Other Clinician: Referring Chanell Nadeau: Treating Savahanna Almendariz/Extender:Robson, Tenna Child, [place] Si[place] in Treatment: 3 Encounter Discharge Information Items Post Procedure  Vitals Discharge Condition: Stable Temperature (F): 97.7 Ambulatory Status: Walker Pulse (bpm): 58 Discharge Destination: Home Respiratory Rate (breaths/min): 20 Transportation: Other Blood Pressure (mmHg): 133/61 Accompanied By: self Schedule Follow-up Appointment: Yes Clinical Summary of Care: Patient Declined Electronic Signature(s) Signed: 01/31/2019 5:30:52 PM By: Kela Millin Entered By: Kela Millin on 01/31/2019 17:18:08 -------------------------------------------------------------------------------- Lower Extremity Assessment Details Patient Name: Date of Service: [name] [name], [name] [name] 01/31/2019 8:15 AM Medical Record [id number] Patient Account Number: [id number] Date of Birth/Sex: Treating RN: July 16, 1945 (73 y.o. Debby Bud Primary Care Trevaughn Schear: [name] Other Clinician: Referring [employer] Mcjunkins: Treating Sheray Grist/Extender:Robson, Tenna Child, [place] Si[place] in Treatment: 3 Edema Assessment Assessed: [Left: Yes] [Right: Yes] Edema: [Left: Yes] [Right: Yes] Calf Left: Right: Point of Measurement: 30 cm From Medial Instep 51 cm 53 cm Ankle Left: Right: Point of Measurement: 8 cm From Medial Instep 30 cm 30 cm Vascular Assessment Pulses: Dorsalis Pedis Palpable: [Left:Yes] [Right:Yes] Electronic Signature(s) Signed: 01/31/2019 5:02:12 PM By: Deon Pilling Entered By: Deon Pilling on 01/31/2019 08:18:44 -------------------------------------------------------------------------------- Multi Wound Chart Details Patient Name: Date of Service: [name] [name], [name] [name] 01/31/2019 8:15 AM Medical Record [id number] Patient Account Number: [id number] Date of Birth/Sex: Treating RN: May 08, 1945 (73 y.o. [name] Primary Care Jennafer Gladue: [name] Other Clinician: Referring Lorelei Heikkila: Treating Lachlan Pelto/Extender:Robson, Tenna Child, [place] Si[place] in Treatment: 3 Vital Signs Height(in): 71 Pulse(bpm): 47 Weight(lbs):  240 Blood Pressure(mmHg): 133/61 Body Mass Index(BMI): 33 Temperature(F): 97.7 Respiratory 20 Rate(breaths/min): Photos: [28:No Photos] [29:No Photos] [30:No Photos] Wound Location: [28:Right Lower Leg - Lateral] [29:Right Lower Leg - Posterior Right Lower Leg - Lateral,] [30:Distal] Wounding Event: [28:Gradually Appeared] [29:Gradually Appeared] [30:Gradually Appeared] Primary Etiology: [28:Lymphedema] [29:Lymphedema] [30:Lymphedema] 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] [29:Lower Extremity] [30:Lower Extremity] Comorbid History: [28:Cataracts, Lymphedema, Cataracts, Lymphedema, Cataracts, Lymphedema, Arrhythmia, Congestive Heart Failure, Hypertension, Heart Failure, Hypertension, Heart Failure, Hypertension, Peripheral Venous Disease, Peripheral Venous Disease,  Peripheral Venous Disease, Type II Diabetes, Osteoarthritis, Neuropathy, Osteoarthritis, Neuropathy, Osteoarthritis, Neuropathy, Confinement Anxiety] [29:Arrhythmia, Congestive Type II Diabetes, Confinement Anxiety] [30:Arrhythmia, Congestive Type II  Diabetes, Confinement Anxiety] Date Acquired: [28:08/05/2018] [29:08/05/2018] [30:08/05/2018] Weeks of Treatment: [28:3] [29:3] [30:3] Wound Status: [28:Open] [29:Open] [30:Open] Clustered Wound: [28:No] [29:No] [30:No] Clustered Quantity: [28:N/A] [29:N/A] [30:N/A] Measurements L x W x D 4.7x5x0.1 [29:2.5x2x0.1] [30:1x1.1x0.1] (cm) Area (cm) : [28:18.457] [29:3.927] [30:0.864] Volume (cm) : [28:1.846] [29:0.393] [30:0.086] % Reduction in Area: [28:48.40%] [29:-122.20%] [30:9.10%] % Reduction in Volume: 48.30% [29:-122.00%] [30:9.50%] Classification: [28:Full Thickness Without Exposed Support StructuresExposed Support StructuresExposed Support Structures] [29:Full Thickness Without] [30:Full Thickness Without] Exudate Amount: [28:Medium] [29:Medium] [30:Medium] Exudate Type: [28:Serosanguineous]  [29:Serosanguineous] [30:Serosanguineous] Exudate Color: [28:red, brown] [29:red, brown] [30:red, brown] Wound Margin: [28:Flat and Intact] [29:Flat and Intact] [30:Flat and Intact] Granulation Amount: [28:Large (67-100%)] [29:Large (67-100%)] [30:Large (67-100%)] Granulation Quality: [28:Red] [29:Red] [30:Pink, Pale] Necrotic Amount: [28:Small (1-33%)] [29:None Present (0%)] [30:Small (1-33%)] Exposed Structures: [28:Fat Layer (Subcutaneous Fat Layer (Subcutaneous Fat Layer (Subcutaneous Tissue) Exposed: Yes Fascia: No Tendon: No Muscle: No Joint: No Bone: No] [29:Tissue) Exposed: Yes Fascia: No Tendon: No Muscle: No Joint: No Bone: No] [30:Tissue) Exposed: Yes  Fascia: No Tendon: No Muscle: No Joint: No Bone: No] Epithelialization: [28:Small (1-33%)] [29:Medium (34-66%)] [30:Medium (34-66%)] Debridement: [28:N/A] [29:N/A] [30:N/A] Pain Control: [28:N/A] [29:N/A] [30:N/A] Tissue Debrided: [28:N/A] [29:N/A] [30:N/A] Level: [28:N/A] [29:N/A] [30:N/A] Debridement Area (sq cm):N/A [29:N/A] [30:N/A] Instrument: [28:N/A] [29:N/A] [30:N/A] Bleeding: [28:N/A] [29:N/A] [30:N/A] Hemostasis Achieved: [28:N/A] [29:N/A] [30:N/A] Procedural Pain: [28:N/A] [29:N/A] [30:N/A] Post Procedural Pain: [28:N/A] [29:N/A] [30:N/A] Debridement Treatment N/A [29:N/A] [30:N/A] Response: Post Debridement [28:N/A] [29:N/A] [30:N/A] Measurements L x W x D (cm) Post Debridement [28:N/A] [29:N/A] [30:N/A] Volume: (cm) Assessment Notes: [28:macerated periwound.] [29:N/A] [30:N/A] Procedures Performed: Compression Therapy [28:31] [29:Compression Therapy 32] [30:Compression Therapy 33] Photos: [28:No Photos] [29:No Photos] [30:No Photos] Wound Location: [28:Right Lower Leg - Posterior, Left Lower Leg - Anterior Distal] [30:Left Lower Leg - Lateral, Proximal] Wounding Event: [28:Gradually Appeared] [29:Gradually Appeared] [30:Gradually Appeared] Primary Etiology: [28:Lymphedema] [29:Lymphedema]  [30:Lymphedema] 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] [29:Lower Extremity] [30:Lower Extremity] Comorbid History: [28:Cataracts, Lymphedema, Cataracts, Lymphedema, Cataracts, Lymphedema, Arrhythmia, Congestive Heart Failure, Hypertension, Heart Failure, Hypertension, Heart Failure, Hypertension, Peripheral Venous Disease, Peripheral Venous Disease,  Peripheral Venous Disease, Type II Diabetes, Osteoarthritis, Neuropathy, Osteoarthritis, Neuropathy, Osteoarthritis, Neuropathy, Confinement Anxiety] [29:Arrhythmia, Congestive Type II Diabetes, Confinement Anxiety] [30:Arrhythmia, Congestive Type II  Diabetes, Confinement Anxiety] Date Acquired: [28:08/05/2018] [29:08/05/2018] [30:08/05/2018] Weeks of Treatment: [28:3] [29:3] [30:3] Wound Status: [28:Open] [29:Open] [30:Open] Clustered Wound: [28:No] [29:No] [30:Yes] Clustered Quantity: [28:N/A] [29:N/A] [30:2] Measurements L x W x D 2x1.5x0.1 [29:0.5x0.4x0.1] [30:7.5x13x0.1] (cm) Area (cm) : [28:2.356] [29:0.157] [30:76.576] Volume (cm) : [28:0.236] [29:0.016] [30:7.658] % Reduction in Area: [28:-130.80%] [29:87.90%] [30:-121.60%] % Reduction in Volume: [28:-131.40%] [29:87.70%] [30:-121.60%] Classification: [28:Full Thickness Without Exposed Support Structures Exposed Support Structures Exposed Support Structures] [29:Full Thickness Without] [30:Full Thickness Without] Exudate Amount: [28:Medium] [29:Medium] [30:Medium] Exudate Type: [28:Serosanguineous] [29:Purulent] [30:Purulent] Exudate Color: [28:red, brown] [29:yellow, brown, green] [30:yellow, brown, green] Wound Margin: [28:Flat and Intact] [29:Flat and Intact] [30:Flat and Intact] Granulation Amount: [28:Large (67-100%)] [29:None Present (0%)] [30:Small (1-33%)] Granulation Quality: [28:Red] [29:N/A] [30:Red, Pink] Necrotic Amount: [28:None Present (0%)] [29:Large (67-100%)] [30:Large (67-100%)] Exposed  Structures: [  28:Fat Layer (Subcutaneous Fat Layer (Subcutaneous Fat Layer (Subcutaneous Tissue) Exposed: Yes Fascia: No Tendon: No Muscle: No Joint: No Bone: No] [29:Tissue) Exposed: Yes Fascia: No Tendon: No Muscle: No Joint: No Bone: No] [30:Tissue) Exposed: Yes  Fascia: No Tendon: No Muscle: No Joint: No Bone: No] Epithelialization: [28:Small (1-33%)] [29:Large (67-100%)] [30:Small (1-33%)] Debridement: [28:N/A] [29:Debridement - Excisional Debridement - Excisional] Pre-procedure [28:N/A] [id number] [30:09:05] Verification/Time Out Taken: Pain Control: [28:N/A] [29:Lidocaine 5% topical ointment] [30:Lidocaine 5% topical ointment] Tissue Debrided: [28:N/A] [29:Subcutaneous, Slough] [30:Subcutaneous, Slough] Level: [28:N/A] [29:Skin/Subcutaneous Tissue] [30:Skin/Subcutaneous Tissue] Debridement Area (sq cm):N/A [29:0.2] [30:97.5] Instrument: [28:N/A] [29:Curette] [30:Curette] Bleeding: [28:N/A] [29:Moderate] [30:Moderate] Hemostasis Achieved: [28:N/A] [29:Pressure] [30:Pressure] Procedural Pain: [28:N/A] [29:0] [30:0] Post Procedural Pain: [28:N/A] [29:0] [30:0] Debridement Treatment N/A [29:Procedure was tolerated] [30:Procedure was tolerated] Response: [29:well] [30:well] Post Debridement [28:N/A] [29:0.5x0.4x0.1] [30:7.5x13x0.1] Measurements L x W x D (cm) Post Debridement [28:N/A] [29:0.016] [30:7.658] Volume: (cm) Assessment Notes: [28:N/A] [29:N/A] [30:macerated, weeping periwounds.] Procedures Performed: [28:Compression Therapy 34] [29:Compression Therapy Debridement] [30:Compression Therapy Debridement 35 N/A] Photos: [28:No Photos] [29:No Photos] [30:N/A] Wound Location: [28:Left Lower Leg - Lateral, Distal] [29:Left Lower Leg - Anterior, N/A Distal] Wounding Event: [28:Gradually Appeared] [29:Gradually Appeared] [30:N/A] Primary Etiology: [28:Lymphedema] [29:Lymphedema] [30:N/A] Secondary Etiology: [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] [30:N/A] Comorbid  History: [28:Cataracts, Lymphedema, Cataracts, Lymphedema, N/A Arrhythmia, Congestive Heart Failure, Hypertension, Heart Failure, Hypertension, Peripheral Venous Disease, Peripheral Venous Disease, Type II Diabetes, Osteoarthritis, Neuropathy,  Osteoarthritis, Neuropathy, Confinement Anxiety] [29:Arrhythmia, Congestive Type II Diabetes, Confinement Anxiety] Date Acquired: [28:01/10/2019] [29:01/24/2019] [30:N/A] Weeks of Treatment: [28:3] [29:1] [30:N/A] Wound Status: [28:Open] [29:Open] [30:N/A] Clustered Wound: [28:No] [29:No] [30:N/A] Clustered Quantity: [28:N/A] [29:N/A] [30:N/A] Measurements L x W x D [28:6.5x7x0.1] [29:0.2x0.2x0.1] [30:N/A] (cm) Area (cm) : [28:35.736] [29:0.031] [30:N/A] Volume (cm) : [28:3.574] [id number] [30:N/A] % Reduction in Area: [28:-1230.50%] [29:83.50%] [30:N/A] % Reduction in Volume: [28:-1228.60%] [29:84.20%] [30:N/A] Classification: [28:Full Thickness Without Exposed Support Structures Exposed Support Structures] [29:Full Thickness Without] [30:N/A] Exudate Amount: [28:Large] [29:Medium] [30:N/A] Exudate Type: [28:Purulent] [29:Serosanguineous] [30:N/A] Exudate Color: [28:yellow, brown, green] [29:red, brown] [30:N/A] Wound Margin: [28:Flat and Intact] [29:Distinct, outline attached N/A] Granulation Amount: [28:Small (1-33%)] [29:None Present (0%)] [30:N/A] Granulation Quality: [28:Red] [29:N/A] [30:N/A] Necrotic Amount: [28:Large (67-100%)] [29:Large (67-100%)] [30:N/A] Exposed Structures: [28:Fat Layer (Subcutaneous Fat Layer (Subcutaneous N/A Tissue) Exposed: Yes Fascia: No Tendon: No Muscle: No Joint: No Bone: No] [29:Tissue) Exposed: Yes Fascia: No Tendon: No Muscle: No Joint: No Bone: No] Epithelialization: [28:None] [29:Large (67-100%)] [30:N/A] Debridement: [28:Debridement - Excisional N/A] [30:N/A] Pre-procedure [id number] [29:N/A] [30:N/A] Verification/Time Out Taken: Pain Control: [28:Lidocaine 5% topical ointment] [29:N/A] [30:N/A] Tissue  Debrided: [28:Subcutaneous, Slough] [29:N/A] [30:N/A] Level: [28:Skin/Subcutaneous Tissue] [29:N/A] [30:N/A] Debridement Area (sq cm):45.5 [29:N/A] [30:N/A] Instrument: [28:Curette] [29:N/A] [30:N/A] Bleeding: [28:Moderate] [29:N/A] [30:N/A] Hemostasis Achieved: [28:Pressure] [29:N/A] [30:N/A] Procedural Pain: [28:0] [29:N/A] [30:N/A] Post Procedural Pain: [28:0] [29:N/A] [30:N/A] Debridement Treatment Procedure was tolerated [29:N/A] [30:N/A] Response: [28:well] Post Debridement [28:6.5x7x0.1] [29:N/A] [30:N/A] Measurements L x W x D (cm) Post Debridement [28:3.574] [29:N/A] [30:N/A] Volume: (cm) Assessment Notes: [28:macerated, weeping periwound.] [29:N/A] [30:N/A] Procedures Performed: Compression Therapy [28:Debridement] [29:N/A] [30:N/A] Treatment Notes Wound #28 (Right, Lateral Lower Leg) 1. Cleanse With Wound Cleanser Soap and water 2. Periwound Care TCA Ointment 3. Primary Dressing Applied Hydrofera Blue 4. Secondary Dressing ABD Pad 6. Support Layer Applied 4 layer compression wrap Wound #29 (Right, Posterior Lower Leg) 1. Cleanse With Wound Cleanser Soap and water 2. Periwound Care TCA Ointment 3. Primary Dressing Applied Hydrofera Blue 4. Secondary Dressing ABD Pad 6. Support Layer [employer]  4 layer compression wrap Wound #30 (Right, Distal, Lateral Lower Leg) 1. Cleanse With Wound Cleanser Soap and water 2. Periwound Care TCA Ointment 3. Primary Dressing Applied Hydrofera Blue 4. Secondary Dressing ABD Pad 6. Support Layer Applied 4 layer compression wrap Wound #31 (Right, Distal, Posterior Lower Leg) 1. Cleanse With Wound Cleanser Soap and water 2. Periwound Care TCA Ointment 3. Primary Dressing Applied Hydrofera Blue 4. Secondary Dressing ABD Pad 6. Support Layer Applied 4 layer compression wrap Wound #32 (Left, Anterior Lower Leg) 1. Cleanse With Wound Cleanser Soap and water 2. Periwound Care TCA Ointment 3. Primary Dressing  Applied Hydrofera Blue 4. Secondary Dressing ABD Pad 6. Support Layer Applied 4 layer compression wrap Wound #33 (Left, Proximal, Lateral Lower Leg) 1. Cleanse With Wound Cleanser Soap and water 2. Periwound Care TCA Ointment 3. Primary Dressing Applied Hydrofera Blue 4. Secondary Dressing ABD Pad 6. Support Layer Applied 4 layer compression wrap Wound #34 (Left, Distal, Lateral Lower Leg) 1. Cleanse With Wound Cleanser Soap and water 2. Periwound Care TCA Ointment 3. Primary Dressing Applied Hydrofera Blue 4. Secondary Dressing ABD Pad 6. Support Layer Applied 4 layer compression wrap Wound #35 (Left, Distal, Anterior Lower Leg) 1. Cleanse With Wound Cleanser Soap and water 2. Periwound Care TCA Ointment 3. Primary Dressing Applied Hydrofera Blue 4. Secondary Dressing ABD Pad 6. Support Layer Applied 4 layer compression Water quality scientist) Signed: 01/31/2019 6:05:35 PM By: Linton Ham MD Signed: 03/14/2019 3:01:15 PM By: Carlene Coria RN Entered By: Linton Ham on 01/31/2019 09:44:21 -------------------------------------------------------------------------------- Multi-Disciplinary Care Plan Details Patient Name: Date of Service: KAZIA, MORINI 01/31/2019 8:15 AM Medical Record [id number] Patient Account Number: [id number] Date of Birth/Sex: Treating RN: 09/19/1945 (73 y.o. [name] Primary Care Lyann Hagstrom: Other Clinician: [name] Referring Cheyenna Pankowski: Treating Daysia Vandenboom/Extender:Robson, Tenna Child, [place] Siren in Treatment: 3 Active Inactive Abuse / Safety / Falls / Self Care Management Nursing Diagnoses: Impaired physical mobility Potential for falls Goals: Patient will not develop complications from immobility Date Initiated: 01/10/2019 Target Resolution Date: 02/10/2019 Goal Status: Active Patient will not experience any injury related to falls Date Initiated: 01/10/2019 Target Resolution Date:  02/10/2019 Goal Status: Active Patient/caregiver will verbalize understanding of skin care regimen Date Initiated: 01/10/2019 Target Resolution Date: 02/10/2019 Goal Status: Active Interventions: Assess Activities of Daily Living upon admission and as needed Assess fall risk on admission and as needed Assess: immobility, friction, shearing, incontinence upon admission and as needed Assess impairment of mobility on admission and as needed per policy Assess personal safety and home safety (as indicated) on admission and as needed Assess self care needs on admission and as needed Notes: Wound/Skin Impairment Nursing Diagnoses: Knowledge deficit related to ulceration/compromised skin integrity Goals: Patient/caregiver will verbalize understanding of skin care regimen Date Initiated: 01/10/2019 Target Resolution Date: 02/10/2019 Goal Status: Active Ulcer/skin breakdown will have a volume reduction of 30% by week 4 Date Initiated: 01/10/2019 Target Resolution Date: 02/10/2019 Goal Status: Active Interventions: Assess patient/caregiver ability to obtain necessary supplies Assess patient/caregiver ability to perform ulcer/skin care regimen upon admission and as needed Assess ulceration(s) every visit Notes: Electronic Signature(s) Signed: 03/14/2019 3:01:15 PM By: Carlene Coria RN Entered By: Carlene Coria on 01/31/2019 08:10:20 -------------------------------------------------------------------------------- Pain Assessment Details Patient Name: Date of Service: LYNISE, LAVANWAY 01/31/2019 8:15 AM Medical Record [id number] Patient Account Number: [id number] Date of Birth/Sex: Treating RN: 1945-12-14 (73 y.o. Debby Bud Primary Care Ashaunte Standley: [name] Other Clinician: Referring Bethanne Mule: Treating Leeann Bady/Extender:Robson, Tenna Child, [place] Siren in Treatment: 3 Active Problems Location  of Pain Severity and Description of Pain Patient Has Paino Yes Site  Locations Pain Location: Generalized Pain Rate the pain. Current Pain Level: 7 Worst Pain Level: 10 Least Pain Level: 0 Tolerable Pain Level: 8 Pain Management and Medication Current Pain Management: Medication: No Cold Application: No Rest: No Massage: No Activity: No T.E.N.S.: No Heat Application: No Leg drop or elevation: No Is the Current Pain Management Adequate: Adequate How does your wound impact your activities of daily livingo Sleep: No Bathing: No Appetite: No Relationship With Others: No Bladder Continence: No Emotions: No Bowel Continence: No Work: No Toileting: No Drive: No Dressing: No Hobbies: No Electronic Signature(s) Signed: 01/31/2019 5:02:12 PM By: Deon Pilling Entered By: Deon Pilling on 01/31/2019 08:15:17 -------------------------------------------------------------------------------- Patient/Caregiver Education Details Patient Name: Date of Service: Waverly Ferrari 10/27/2020andnbsp8:15 AM Medical Record [id number] Patient Account Number: [id number] Date of Birth/Gender: Treating RN: 1945/04/07 (73 y.o. [name] Primary Care Physician: [name] Other Clinician: Referring Physician: Treating Physician/Extender:Robson, Tenna Child, [place] Siren in Treatment: 3 Education Assessment Education Provided To: Patient Education Topics Provided Wound/Skin Impairment: Methods: Explain/Verbal Responses: State content correctly Electronic Signature(s) Signed: 03/14/2019 3:01:15 PM By: Carlene Coria RN Entered By: Carlene Coria on 01/31/2019 08:10:41 -------------------------------------------------------------------------------- Wound Assessment Details Patient Name: Date of Service: MARTINEZ, HENAULT 01/31/2019 8:15 AM Medical Record [id number] Patient Account Number: [id number] Date of Birth/Sex: Treating RN: 1946-02-12 (73 y.o. 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 Event: Gradually Appeared Etiology: Date Acquired: 08/05/2018 Wound Open Weeks Of Treatment: 3 Status: Clustered Wound: No Comorbid Cataracts, Lymphedema, Arrhythmia, History: Congestive Heart Failure, Hypertension, Peripheral Venous Disease, Type II Diabetes, Osteoarthritis, Neuropathy, Confinement Anxiety Photos Wound Measurements Length: (cm) 4.7 % Reduc Width: (cm) 5 % Reduc Depth: (cm) 0.1 Epithel Area: (cm) 18.457 Tunnel Volume: (cm) 1.846 Underm Wound Description Classification: Full Thickness Without Exposed Support Structures Wound Flat and Intact Margin: Exudate Medium Amount: Exudate Serosanguineous Type: Exudate red, brown Color: Wound Bed Granulation Amount: Large (67-100%) Granulation Quality: Red Necrotic Amount: Small (1-33%) Necrotic Quality: Adherent Slough Foul Odor After Cleansing: No Slough/Fibrino Yes Exposed Structure Fascia Exposed: No Fat Layer (Subcutaneous Tissue) Exposed: Yes Tendon Exposed: No Muscle Exposed: No Joint Exposed: No Bone Exposed: No tion in Area: 48.4% tion in Volume: 48.3% ialization: Small (1-33%) ing: No ining: No Assessment Notes macerated periwound. Electronic Signature(s) Signed: 02/02/2019 3:04:52 PM By: Deon Pilling Signed: 02/08/2019 4:01:07 PM By: Mikeal Hawthorne EMT/HBOT Previous Signature: 01/31/2019 5:02:12 PM Version By: Deon Pilling Entered By: Mikeal Hawthorne on 02/02/2019 07:59:59 -------------------------------------------------------------------------------- Wound Assessment Details Patient Name: Date of Service: Deskins, Caroleen 01/31/2019 8:15 AM Medical Record [id number] Patient Account Number:  [id number] Date of Birth/Sex: Treating RN: June 11, 1945 (73 y.o. 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 Event: Gradually Appeared Etiology: Date Acquired: 08/05/2018 Wound Open Weeks Of Treatment: 3 Status: Clustered Wound: No Comorbid Cataracts, Lymphedema, Arrhythmia, History: Congestive Heart Failure, Hypertension, Peripheral Venous Disease, Type II Diabetes, Osteoarthritis, Neuropathy, Confinement Anxiety Photos Wound Measurements Length: (cm) 2.5 % Reduc Width: (cm) 2 % Reduc Depth: (cm) 0.1 Epithel Area: (cm) 3.927 Tunnel Volume: (cm)  0.393 Underm Wound Description Full Thickness Without Exposed Support Foul O Classification: Structures Slough Wound Flat and Intact Margin: Exudate Medium Amount: Exudate Serosanguineous Type: Exudate red, brown Color: Wound Bed Granulation Amount: Large (67-100%) Granulation Quality: Red Fascia Necrotic Amount: None Present (0%) Fat Lay Tendon Muscle Joint E Bone Ex dor After Cleansing: No /Fibrino Yes Exposed Structure Exposed: No er (Subcutaneous Tissue) Exposed: Yes Exposed: No Exposed: No xposed: No posed: No tion in Area: -122.2% tion in Volume: -122% ialization: Medium (34-66%) ing: No ining: No Electronic Signature(s) Signed: 02/02/2019 3:04:52 PM By: Deon Pilling Signed: 02/08/2019 4:01:07 PM By: Mikeal Hawthorne EMT/HBOT Previous Signature: 01/31/2019 5:02:12 PM Version By: Deon Pilling Previous Signature: 01/31/2019 5:02:12 PM Version By: Deon Pilling Entered By: Mikeal Hawthorne on 02/02/2019 07:59:37 -------------------------------------------------------------------------------- Wound Assessment  Details Patient Name: Date of Service: Kiesel, Lunell 01/31/2019 8:15 AM Medical Record [id number] Patient Account Number: [id number] Date of Birth/Sex: Treating RN: [birth year] (73 y.o. 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 Event: Gradually Appeared Etiology: Date Acquired: 08/05/2018 Wound Open Weeks Of Treatment: 3 Status: Clustered Wound: No Comorbid Cataracts, Lymphedema, Arrhythmia, History: Congestive Heart Failure, Hypertension, Peripheral Venous Disease, Type II Diabetes, Osteoarthritis, Neuropathy, Confinement Anxiety Photos Wound Measurements Length: (cm) 1 % Reduc Width: (cm) 1.1 % Reduc Depth: (cm) 0.1 Epithel Area: (cm) 0.864 Tunnel Volume: (cm) 0.086 Underm Wound Description Classification: Full Thickness Without Exposed Support Structures Wound Flat and Intact Margin: Exudate Medium Amount: Exudate Serosanguineous Type: Exudate red, brown Color: Wound Bed Granulation Amount: Large (67-100%) Granulation Quality: Pink, Pale Necrotic Amount: Small (1-33%) Necrotic Quality: Adherent Slough Foul Odor After Cleansing: No Slough/Fibrino Yes Exposed Structure Fascia Exposed: No Fat Layer (Subcutaneous Tissue) Exposed: Yes Tendon Exposed: No Muscle Exposed: No Joint Exposed: No Bone Exposed: No tion in Area: 9.1% tion in Volume: 9.5% ialization: Medium (34-66%) ing: No ining: No Electronic Signature(s) Signed: 02/02/2019 3:04:52 PM By: Deon Pilling Signed: 02/08/2019 4:01:07 PM By: Mikeal Hawthorne EMT/HBOT Previous Signature: 01/31/2019 5:02:12 PM Version By: Deon Pilling Entered By: Mikeal Hawthorne on 02/02/2019  08:00:20 -------------------------------------------------------------------------------- Wound Assessment Details Patient Name: Date of Service: Olvera, Earla 01/31/2019 8:15 AM Medical Record [id number] Patient Account Number: [id number] Date of Birth/Sex: Treating RN: 1945-12-24 (73 y.o. 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 Event: Gradually Appeared Etiology: Date Acquired: 08/05/2018 Wound Open Weeks Of Treatment: 3 Status: Clustered Wound: No Comorbid Cataracts, Lymphedema, Arrhythmia, History: Congestive Heart Failure, Hypertension, Peripheral Venous Disease, Type II Diabetes, Osteoarthritis, Neuropathy, Confinement Anxiety Photos Wound Measurements Length: (cm) 2 % Redu Width: (cm) 1.5 % Redu Depth: (cm) 0.1 Epithe Area: (cm) 2.356 Tunnel Volume: (cm) 0.236 Underm Wound Description Full Thickness Without Exposed Support Foul O Classification: Structures Slough Wound Flat and Intact Margin: Exudate Medium Amount: Exudate Serosanguineous Type: Exudate red, brown Color: Wound Bed Granulation Amount: Large (67-100%) Granulation Quality: Red Fascia Necrotic Amount: None Present (0%) Fat Lay Tendon Muscle Joint E Bone Ex dor After Cleansing: No /Fibrino Yes Exposed Structure Exposed: No er (Subcutaneous Tissue) Exposed: Yes Exposed: No Exposed: No xposed: No posed: No ction in Area: -  130.8% ction in Volume: -131.4% lialization: Small (1-33%) ing: No ining: No Electronic Signature(s) Signed: 02/02/2019 3:04:52 PM By: Deon Pilling Signed: 02/08/2019 4:01:07 PM By: Mikeal Hawthorne EMT/HBOT Previous Signature: 01/31/2019  5:02:12 PM Version By: Deon Pilling Entered By: Mikeal Hawthorne on 02/02/2019 07:58:08 -------------------------------------------------------------------------------- Wound Assessment Details Patient Name: Date of Service: Dadamo, Zyann 01/31/2019 8:15 AM Medical Record [id number] Patient Account Number: [id number] Date of Birth/Sex: Treating RN: 13-Feb-1946 (73 y.o. 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 Event: Gradually Appeared Etiology: Date Acquired: 08/05/2018 Wound Open Weeks Of Treatment: 3 Status: Clustered Wound: No Comorbid Cataracts, Lymphedema, Arrhythmia, History: Congestive Heart Failure, Hypertension, Peripheral Venous Disease, Type II Diabetes, Osteoarthritis, Neuropathy, Confinement Anxiety Photos Wound Measurements Length: (cm) 0.5 % Reduc Width: (cm) 0.4 % Reduc Depth: (cm) 0.1 Epithel Area: (cm) 0.157 Tunnel Volume: (cm) 0.016 Underm Wound Description Full Thickness Without Exposed Support Foul Od Classification: Structures Slough/ Wound Flat and Intact Margin: Exudate Medium Amount: Exudate Purulent Type: Exudate yellow, brown, green Color: Wound Bed Granulation Amount: None Present (0%) Necrotic Amount: Large (67-100%) Fascia Necrotic Quality: Adherent Slough Fat Lay Tendon Muscle Joint E Bone Ex or After Cleansing: No Fibrino Yes Exposed Structure Exposed: No er (Subcutaneous Tissue) Exposed: Yes Exposed: No Exposed: No xposed: No posed: No tion in Area: 87.9% tion in Volume: 87.7% ialization: Large (67-100%) ing: No ining: No Electronic Signature(s) Signed: 02/02/2019 3:04:52 PM By: Deon Pilling Signed:  02/08/2019 4:01:07 PM By: Mikeal Hawthorne EMT/HBOT Previous Signature: 01/31/2019 5:02:12 PM Version By: Deon Pilling Entered By: Mikeal Hawthorne on 02/02/2019 08:01:32 -------------------------------------------------------------------------------- Wound Assessment Details Patient Name: Date of Service: Madarang, Cortny 01/31/2019 8:15 AM Medical Record [id number] Patient Account Number: [id number] Date of Birth/Sex: Treating RN: [birth year] (73 y.o. Helene Shoe, Tammi Klippel Primary Care Valita Righter: [name] Other Clinician: Referring Madelline Eshbach: Treating Donivin Wirt/Extender:Robson, Tenna Child, [place] Siren in Treatment: 3 Wound Status Wound Number: 33 Primary Lymphedema Etiology: Wound Location: Left Lower Leg - Lateral, Proximal Secondary Diabetic Wound/Ulcer of the Lower Extremity Wounding Event: Gradually Appeared Etiology: Date Acquired: 08/05/2018 Wound Open Weeks Of Treatment: 3 Status: Clustered Wound: Yes Comorbid Cataracts, Lymphedema, Arrhythmia, History: Congestive Heart Failure, Hypertension, Peripheral Venous Disease, Type II Diabetes, Osteoarthritis, Neuropathy, Confinement Anxiety Photos Wound Measurements Length: (cm) 7.5 % Reduct Width: (cm) 13 % Reduct Depth: (cm) 0.1 Epitheli Clustered Quantity: 2 Tunnelin Area: (cm) 76.576 Undermi Volume: (cm) 7.658 Wound Description Classification: Full Thickness Without Exposed Support Foul Odo Structures Slough/F Wound Flat and Intact Margin: Exudate Medium Amount: Exudate Purulent Type: Exudate yellow, brown, green Color: Wound Bed Granulation Amount: Small (1-33%) Granulation Quality: Red, Pink Fascia E Necrotic Amount: Large (67-100%) Fat Laye Necrotic Quality: Adherent Slough Tendon E Muscle E Joint Ex Bone Exp r After Cleansing: No ibrino Yes Exposed Structure xposed: No r (Subcutaneous Tissue) Exposed: Yes xposed: No xposed: No posed: No osed: No ion in Area: -121.6% ion in  Volume: -121.6% alization: Small (1-33%) g: No ning: No Assessment Notes macerated, weeping periwounds. Electronic Signature(s) Signed: 02/02/2019 3:04:52 PM By: Deon Pilling Signed: 02/08/2019 4:01:07 PM By: Mikeal Hawthorne EMT/HBOT Previous Signature: 01/31/2019 5:02:12 PM Version By: Deon Pilling Entered By: Mikeal Hawthorne on 02/02/2019 08:02:00 -------------------------------------------------------------------------------- Wound Assessment Details Patient Name: Date of Service: Pfiffner, Hiromi 01/31/2019 8:15 AM Medical Record [id number] Patient Account Number: [id number] Date of  Birth/Sex: Treating RN: 1945-09-18 (73 y.o. Helene Shoe, Tammi Klippel Primary Care Tiarna Koppen: [name] Other Clinician: Referring Orlandus Borowski: Treating Curtis Uriarte/Extender:Robson, Tenna Child, [place] Siren in Treatment: 3 Wound Status Wound Number: 34 Primary Lymphedema Etiology: Wound Location: Left Lower Leg - Lateral, Distal Secondary Diabetic Wound/Ulcer of the Lower Extremity Wounding Event: Gradually Appeared Etiology: Date Acquired: 01/10/2019 Wound Open Weeks Of Treatment: 3 Status: Clustered Wound: No Comorbid Cataracts, Lymphedema, Arrhythmia, History: Congestive Heart Failure, Hypertension, Peripheral Venous Disease, Type II Diabetes, Osteoarthritis, Neuropathy, Confinement Anxiety Photos Wound Measurements Length: (cm) 6.5 % Reduc Width: (cm) 7 % Reduc Depth: (cm) 0.1 Epithel Area: (cm) 35.736 Tunnel Volume: (cm) 3.574 Underm Wound Description Classification: Full Thickness Without Exposed Support Foul Od Structures Slough/ Wound Flat and Intact Margin: Exudate Large Amount: Exudate Purulent Type: Exudate yellow, brown, green yellow, brown, green Color: Wound Bed Granulation Amount: Small (1-33%) Granulation Quality: Red Fascia E Necrotic Amount: Large (67-100%) Fat Laye Necrotic Quality: Adherent Slough Tendon E Muscle E Joint Ex Bone Exp or  After Cleansing: No Fibrino Yes Exposed Structure xposed: No r (Subcutaneous Tissue) Exposed: Yes xposed: No xposed: No posed: No osed: No tion in Area: -1230.5% tion in Volume: -1228.6% ialization: None ing: No ining: No Assessment Notes macerated, weeping periwound. Electronic Signature(s) Signed: 02/02/2019 3:04:52 PM By: Deon Pilling Signed: 02/08/2019 4:01:07 PM By: Mikeal Hawthorne EMT/HBOT Previous Signature: 01/31/2019 5:02:12 PM Version By: Deon Pilling Entered By: Mikeal Hawthorne on 02/02/2019 08:02:31 -------------------------------------------------------------------------------- Wound Assessment Details Patient Name: Date of Service: Boyett, Mylinh 01/31/2019 8:15 AM Medical Record [id number] Patient Account Number: [id number] Date of Birth/Sex: Treating RN: 07-11-1945 (73 y.o. Helene Shoe, Tammi Klippel Primary Care Kathleene Bergemann: [name] Other Clinician: Referring John Williamsen: Treating Odell Fasching/Extender:Robson, Tenna Child, [place] Si[place] in Treatment: 3 Wound Status Wound Number: 35 Primary Lymphedema Etiology: Wound Location: Left Lower Leg - Anterior, Distal Wound Open Wounding Event: Gradually Appeared Status: Date Acquired: 01/24/2019 Comorbid Cataracts, Lymphedema, Arrhythmia, Weeks Of Treatment: 1 History: Congestive Heart Failure, Hypertension, Clustered Wound: No Peripheral Venous Disease, Type II Diabetes, Osteoarthritis, Neuropathy, Confinement Anxiety Photos Wound Measurements Length: (cm) 0.2 % Reduct Width: (cm) 0.2 % Reduc Depth: (cm) 0.1 Epithel Area: (cm) 0.031 Tunnel Volume: (cm) 0.003 Underm Wound Description Classification: Full Thickness Without Exposed Support Structures Wound Distinct, outline attached Margin: Exudate Medium Amount: Exudate Serosanguineous Type: Exudate red, brown Color: Wound Bed Granulation Amount: None Present (0%) Necrotic Amount: Large (67-100%) Necrotic Quality: Adherent  Slough Slough/Fibrino Yes Exposed Structure Fascia Exposed: No Fat Layer (Subcutaneous Tissue) Exposed: Yes Tendon Exposed: No Muscle Exposed: No Joint Exposed: No Bone Exposed: No ion in Area: 83.5% tion in Volume: 84.2% ialization: Large (67-100%) ing: No ining: No Electronic Signature(s) Signed: 02/02/2019 3:04:52 PM By: Deon Pilling Signed: 02/08/2019 4:01:07 PM By: Mikeal Hawthorne EMT/HBOT Previous Signature: 01/31/2019 5:02:12 PM Version By: Deon Pilling Entered By: Mikeal Hawthorne on 02/02/2019 08:00:42 -------------------------------------------------------------------------------- Vitals Details Patient Name: Date of Service: Sealey, Khadeejah 01/31/2019 8:15 AM Medical Record [id number] Patient Account Number: [id number] Date of Birth/Sex: Treating RN: 10/06/1945 (73 y.o. Helene Shoe, Tammi Klippel Primary Care Sherl Yzaguirre: [name] Other Clinician: Referring Ileta Ofarrell: Treating Labrisha Wuellner/Extender:Robson, Tenna Child, [place] Siren in Treatment: 3 Vital Signs Time Taken: 08:14 Temperature (F): 97.7 Height (in): 71 Pulse (bpm): 58 Weight (lbs): 240 Respiratory Rate (breaths/min): 20 Body Mass Index (BMI): 33.5 Blood Pressure (mmHg): 133/61 Reference Range: 80 - 120 mg / dl Electronic Signature(s) Signed: 01/31/2019 5:02:12 PM By: Deon Pilling Entered By: Deon Pilling on 01/31/2019 08:14:59

## 2019-03-15 NOTE — Progress Notes (Signed)
[name], [name] ([id number]) Visit Report for 02/07/2019 Debridement Details Patient Name: Date of Service: [name] [name], RICKENBACH 02/07/2019 8:15 AM Medical Record [id number] Patient Account Number: [id number] Date of Birth/Sex: Treating RN: 02-10-1946 (73 y.o. [name] Primary Care Provider: [name] Other Clinician: Referring Provider: Treating Provider/Extender:Robson, Tenna Child, [place] Siren in Treatment: 4 Debridement Performed for Wound #33 Left,Proximal,Lateral Lower Leg Assessment: Performed By: Physician Ricard Dillon., MD Debridement Type: Debridement Severity of Tissue Pre Fat layer exposed Debridement: Level of Consciousness (Pre- Awake and Alert procedure): Pre-procedure Verification/Time Out Taken: Yes - 08:56 Start Time: 08:56 Total Area Debrided (L x W): 3 (cm) x 9.5 (cm) = 28.5 (cm) Tissue and other material Viable, Subcutaneous debrided: Level: Skin/Subcutaneous Tissue Debridement Description: Excisional Instrument: Curette Bleeding: Minimum Hemostasis Achieved: Pressure End Time: 09:00 Procedural Pain: 3 Post Procedural Pain: 0 Response to Treatment: Procedure was tolerated well Level of Consciousness Awake and Alert (Post-procedure): Post Debridement Measurements of Total Wound Length: (cm) 3 Width: (cm) 6 Depth: (cm) 0.1 Volume: (cm) 1.414 Character of Wound/Ulcer Post Improved Debridement: Severity of Tissue Post Debridement: Fat layer exposed Post Procedure Diagnosis Same as Pre-procedure Electronic Signature(s) Signed: 02/07/2019 6:03:12 PM By: Linton Ham MD Signed: 03/15/2019 12:05:18 PM By: Carlene Coria RN Entered By: Carlene Coria on 02/07/2019 09:20:01 -------------------------------------------------------------------------------- Debridement Details Patient Name: Date of Service: [name] [name], [name] [name] 02/07/2019 8:15 AM Medical Record [id number] Patient Account Number: [id number] Date of  Birth/Sex: Treating RN: 1945/09/13 (73 y.o. [name] Primary Care Provider: [name] Other Clinician: Referring Provider: Treating Provider/Extender:Robson, Tenna Child, [place] Siren in Treatment: 4 Debridement Performed for Wound #34 Left,Distal,Lateral Lower Leg Assessment: Performed By: Physician Ricard Dillon., MD Debridement Type: Debridement Severity of Tissue Pre Fat layer exposed Debridement: Level of Consciousness (Pre- Awake and Alert procedure): Pre-procedure Yes - 08:56 Verification/Time Out Taken: Start Time: 08:56 Pain Control: Other : benzocaine 20% Total Area Debrided (L x W): 1.5 (cm) x 1.6 (cm) = 2.4 (cm) Tissue and other material Viable, Subcutaneous debrided: Level: Skin/Subcutaneous Tissue Debridement Description: Excisional Instrument: Curette Bleeding: Minimum Hemostasis Achieved: Pressure End Time: 09:00 Procedural Pain: 3 Post Procedural Pain: 0 Response to Treatment: Procedure was tolerated well Level of Consciousness Awake and Alert (Post-procedure): Post Debridement Measurements of Total Wound Length: (cm) 1.5 Width: (cm) 1.6 Depth: (cm) 0.1 Volume: (cm) 0.188 Character of Wound/Ulcer Post Improved Debridement: Severity of Tissue Post Debridement: Fat layer exposed Post Procedure Diagnosis Same as Pre-procedure Electronic Signature(s) Signed: 02/07/2019 6:03:12 PM By: Linton Ham MD Signed: 03/15/2019 12:05:18 PM By: Carlene Coria RN Entered By: Carlene Coria on 02/07/2019 09:20:53 -------------------------------------------------------------------------------- HPI Details Patient Name: Date of Service: [name] [name], Xana 02/07/2019 8:15 AM Medical Record [id number] Patient Account Number: [id number] Date of Birth/Sex: Treating RN: 04-09-1945 (73 y.o. [name] Primary Care Provider: [name] Other Clinician: Referring Provider: Treating Provider/Extender:Robson,  Tenna Child, [place] Siren in Treatment: 4 History of Present Illness HPI Description: 04/09/15; the patient is here for review of wounds on her bilateral lower extremities for the last month. The patient has a history of lymphedema and bilateral chronic venous insufficiency with inflammation she was here for review of wounds on her bilateral legs in 2014 she was discharged home with external compression pumps which she is currently not using. Over the last month she developed wounds on her bilateral lower legs with drainage and pain and she is here for our review of this. 04/16/15; the patient's edema is under much better control. She has 3 areas one on the right  anterior medial leg one on the left posterior leg and a small open area with purulent drainage on the left anterior leg. I have cultured the area on the left anterior leg 04/23/15; continued improvement in the patient's edema. All her wounds of closed I see no open areas. Culture from the left anterior leg last time was negative. READMISSION 02/07/16; this patient is a woman that I saw her earlier in 2017. I believe we discharged her very quickly with bilateral wounds in her legs secondary to chronic venous hypertension with inflammation and secondary lymphedema. She had juxtalite stockings. She has external compression pumps at home from a stay in our clinic in 2014. She tells me that she's had wounds on her bilateral lower legs since March or April of this year. 3 wounds on the left 2 on the right. She is a type II diabetic and has a history of noncompressible vessels bilaterally although she is tolerated 4-layer compression. The last note from her primary physician Dr. [name] was in May. At that point Dr. [name] had referred the patient here for lymphedema management. The patient stated she made a phone call to year but was not able to arrange an appointment. It is not clear that she is actually using anything on  the wounds currently except some gauze that was saturated with drainage per our intake nurse. She is certainly not using her juxtalite stockings and by the patient's own admission she is not using her compression pumps because they are "irritating". I don't see a recent hemoglobin A1c. The patient states she has not been systemically unwell but the legs are painful 02/14/16; patient tolerated her Profore wraps reasonably well. She is using the compression pumps 1 time per day. Measurements of her legs are better in terms of the amount of lymphedema 02/21/16; patient tolerated her Profore wraps it. She says she is using her compression pumps one or 2 times a day. Measurements of her leg circumference are improved and most of her wound dimensions are better, unfortunately the home health that we referred her to did not except her insurance but works successful in getting her accompanied that would except her insurance however she apparently has a $25 co-pay which she cannot afford as she is on a fixed income 03/05/16; patient states she did not back using the pumps. She is healed that 2 wounds on the left leg. She still has areas on the right lateral lower leg, right medial lower leg and left lateral lower leg. All of these look some better and have reduced in area 03/13/16; still 3 wounds 1 on the left lateral leg, one on the left medial/posterior leg which is really the most extensive and a small area on the right lateral leg. All of this in the setting of severe chronic lymphedema and chronic venous inflammation. Damage to the skin with cobblestone thickened skin. 03/20/16; still 2 areas. The area on the left lateral leg appears to of closed over. The right medial leg is still most extensive wound although it appears to be closing over right lateral only a small open area remains. Using silver alginate Profore wraps 03/27/16; the area on the left leg remains closed. The edema control is better  on the left than the right. The right medial leg is still weeping but everything appears to be epithelialized. The lateral aspect of the right leg appears closed. She tells me she has old juxtalite stockings. She has been compliant with her compression pumps. 04/03/16; the  area on the left leg remains closed. She has ordered new juxtalite stockings. The right posterior medial wound is fully epithelialized. However she has an irritated area over the right Achilles that she states is been itching all week under the wraps.. She has been compliant with her compression pumps 04/10/16; the area on the left leg remains closed. The right posterior medial wound is also closed today. The irritated area over the right Achilles which was probably wrap injury is also fully epithelialized and closed. The patient has not received her new juxtalite stockings, she arrives in clinic today with an old juxtalite stocking in place. Sheis using her compression pumps secondary to her severe stage III lymphedema READMISSION 01/21/17; this is a patient we know from several previous stays in this clinic. She has lymphedema with chronic venous insufficiency and inflammation. The last time she was here we discharged her with bilateral juxta lites which she is compliant with. She also has external compression pumps at home from stays in our clinic in 2014 although she tells me that she is not very compliant with these as when she uses them a causes pain in her bilateral knees the patient states that her wounds on her left greater than right leg including 2 large areas on the left anterior and left lateral leg and an area on the right leg open in late June or July since then she is been followed by [facility] and she's had bilateral Unna boot supplied every week for the last month.. I think the wounds have actually improved although they are not specifically dressed. She states she had an ultrasound to rule out DVT bilaterally  that was negative. She does not have arterial studies although she is a diabetic. She states she has a lot of pain in her legs she states she does not use her external compression pumps because it causes pain in her knees. As usual her ABIs were noncompressible bilaterally in this clinic 01/28/17; currently the patient only has one small wound on the left lateral leg and I think this should be healed next week. We are going to try to get her bilateral extremitease stockings. She tells me that compression pumps heard her posterior rib arthritic knees so she is not been compliant with this 02/04/17; the patient's wounds are totally healed. She has chronic venous insufficiency and secondary lymphedema. She has new extremitease stockings. She also has compression pumps Readmission: 04/21/17 on evaluation today patient appears to be doing about the same as what she was previous when we were evaluating her back in November 2018. At that point she had completely healed. With that being said she has now reopened and states that she wasn't aware that she was supposed to come back. 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 Subsequently she also states that although she has the compression that we got for her previously the extremitease stockings she nonetheless doesn't always wear these and being noncompliant often has things will reopen as far as ulcers on her lower extremities. No fevers, chills, nausea, or vomiting noted at this time. Patient has no evidence of dementia. She is having some discomfort in regard to the ulcers at this point. 04/28/17 on evaluation today patient appears to be doing significantly better in regard to her wounds. With that being said she is still having significant discomfort and is not really keen on sharp debridement.  She does not appear to have any evidence of  infection which is good news I do think the Iodoflex is beneficial for her. Of note her ABI's were found in the system to be on the right 1.21 with a TBI of 0.76 and on the left 1.35 with a TBI of 0.75. In general her swelling appears to be significantly improved however. No fevers, chills, nausea, or vomiting noted at this time. Patient has been tolerating the wraps without complication. She has no evidence of dementia. 05/05/17-she is here in follow-up evaluation for bilateral lower extremity venous and lymphedema ulcers. She states she has not been using her lymphedema pumps secondary to pain at her knees. After discussion it was noted that she uses knee-high lymphedema pumps but does have a pair of thigh-high lymphedema pumps. She was strongly advised to use the thigh-high lymphedema pumps and if necessary reduce the setting for improved tolerance. She reports no change in odor and/or drainage. She continues to tolerate sharp debridement poorly with significant pain, primarily to the right lower extremity. We will continue with current treatment plan, along with improved lymphedema pump use and follow-up next week 05/12/17 on evaluation today patient actually appears to be doing much better in regard to her bilateral lower extremity ulcers. Only the right altar actually requires debridement today. The other two cleaned off nicely in two of the three in fact on the left lower extremity or actually almost completely healed. She does have some weeping noted still the bilateral lower extremities left greater than right. She still is not able to use her compression pumps even though I have mentioned this several times to her as did Leah last week. No fevers, chills, nausea, or vomiting noted at this time. Patient has been tolerating the dressing changes without complication. Patient has no evidence of dementia 05/19/17 on evaluation today patient appears to be doing very well regard to her bilateral  lower extremity ulcers. In fact she seems to be improving and in fact healed in a couple of locations that were giving her trouble last week. Overall I'm extremely pleased with how things have progressed. She still has some discomfort especially in the right lateral lower extremity. Fortunately there does not appear to be any evidence of significant infection which is good news she does tell me that she has used her compression pumps several times although not routinely over the past week I did praise her for this. I do think it will be beneficial and that might even be what's helping with her wounds as far as healing is concerned at least to a degree. No fevers, chills, nausea, or vomiting noted at this time. Patient has no dementia and no evidence of infection. 05/26/17 on evaluation today patient presents for evaluation concerning her bilateral lower extremity ulcer secondary to lymphedema. Fortunately she appears to be doing very well at this point in time. Specifically her right leg appears to be completely closed and healed. The left leg though there is still a small area of opening overall has healed. She does have a little bit of excoriation at the top or the wraps are because they slid down on her. Otherwise there's no evidence of infection and the other has improved dramatically now that she is not draining as significantly. Patient is having no significant pain she tells me she has been able to use her lymphedema pumps one time a day although that's not all she can tolerate. Even that is difficult for her. 06/02/17 on  evaluation today patient's ulcers appear to be completely closed at this point. She does still note some odor but I believe this is due to the fact she has not been able to wash her legs as well currently. Fortunately she did bring her compression with her today. She does have the EXTREMIT-EASE Compression. With that being said she is somewhat nervous about going back to this  as previously she broke out yet again once we discontinue wrapping her. READMISSION 11/23/17; patient is now 73 year old diabetic woman with severe chronic venous insufficiency with lymphedema. We care for her last in this clinic in February 2019 at which time she had chronic venous insufficiency with lymphedema bilateral lower extremity ulcers. We discharged her with bilateral wrap around/extremitease stockings and compression pumps. Is fairly clear she is not using the compression pumps stating it causes pain in her knees. In any case she was admitted to hospital from 8/3 through 8/6 with a several week history of recurrent bilateral ulcers. She was felt to have bilateral cellulitis treated with doxycycline IV. She is still on oral doxycycline and has another 2 days worth. At discharge her white count was 14.1 hemoglobin at 8.9. The patient is a diabetic however her last ABIs in 2018 showed a right ABI of 1.21, left of 1.35. TBI's were 0.76 on the right 0.75 on the left. Posterior tibial artery was monophasic on the right triphasic on the left. Dorsalis pedis triphasic on the right and triphasic on the left. She is not felt to have significant macrovascular disease 12/02/17;significant bilateral lower extremity wounds secondary to severe uncontrolled lymphedema. She did not get home health out to change the wraps. So she kept this on all week. She is telling me she using his her compression pumps once a week. In general all the wounds look better. We've been using silver alginate with secondary absorptive dressings 4 layer compression 12/10/17; bilateral lower extremity wounds secondary to uncontrolled lymphedema. She tells me she is using her compression pumps on most days. We've been keeping wraps on all week. Using silver alginate. She has wounds on the right lateral calf left medial and left lateral calf. All of this is somewhat better 12/16/17; the patient has 3 small open areas. One on the  right lateral calf, one on the left medial calf and the small area on the left lateral calf. She has lymphedema, chronic venous insufficiency with hemosiderin deposition. Very fibrotic distal lower extremity scan with suggestion of an inverted bottle sign appearance to her legs. She has compression pumps but she does not use them has not used them in the last week. Currently we have her in 4 layer compression, silver alginate to the wounds and this is being changed once by home health. She is noncompliant with the compression pumps because she says it hurts her knees. She also has compression stockings ando Juxta light stockings at home 12/24/17; the patient's right lateral calf is healed as well as the left medial. There is no open wound on the right leg. She still has 2 areas on the left lateral calf. We transitioned the right leg into an extremitease stocking which she brought in from home. 12/31/17; the patient has a superficial wound on the left lateral lower calf. We've been wrapping this leg. She arrives today with the right leg swollen but without a wound. She cannot get her extremities stocking on the right leg. There is a lot more edema here. She is not using her compression pumps 01/10/2018; patient  has a same superficial wound on the left lateral calf. Her right leg has less peripheral edema. She tells me she also started herself on some Lasix that had been previously prescribed for her at some point but she had never taken it. She is also concerned about whether her compression pumps are leaking. She has extremitease stockings however the edema gets bad enough she can get these on. Currently she has no open wound on the right leg and is mention the edema is actually better than when I saw this 10 days ago 01/17/2018; patient has a same superficial area on the posterior left calf and a weeping area in the mid part of the right lateral calf. She still does not have anyone from [facility] coming to see her compression pumps which she describes as being suspicious for a leakage. I am not really sure how frequently she is using this in any case. She has massive bilateral calf lymphedema. I think there is some spreading lymphedema into her posterior thighs. 01/24/2018; the patient's edema in her legs is actually some better. She still has a smaller area on the left posterior calf although this is better. The weeping area on the right lateral calf is also better. As it turned out she did not get her pumps from [facility] but [facility] and they are going to try to go out and see these this week which is going to help. I explained to her that she will use the compression pumps once a day while we are wrapping her legs but when she transitions to stockings that may be she will require pumping twice a day. Her compliance in this area has not been high and I wonder about her ability to do this herself. 02/01/2018; the area on the posterior left leg is healed. She still has the original wound on the right lateral calf which is epithelialized. The however just above this there is still weeping lymphedema fluid. She has a new area on the upper right calf which is either wrap injury or is she points out where she has been scratching under the wraps. We still not have managed to get a hold of a representative of [facility] to see if the pump is operational. She has extremitease stockings at home however I am not of the opinion that this will maintain her skin integrity without the compression pumps. 02/08/2018; posterior left leg remains healed although she has a new small open area on the left lateral calf. Right lateral calf has two quarter sized open areas and close juxtaposition. There is less weeping edema fluid. She apparently managed to contact [facility]. They are sending her a part. But she is still not received it 02/15/2018; the patient has no  open wound on either leg. Some scale present on the right upper lateral calf just below the fibular head. Edema control is excellent. She did not bring her stockings or her juxta lite wrap around. She has the part for her compression pump but she has not use the compression pumps yet READMISSION 01/10/2019 Patient is now a 74 year old woman that we have had in the clinic multiple times in the past. She has chronic lower extremity lymphedema, recurrent wounds on her bilateral lower extremities. She is also a type II diabetic reasonably well controlled with a recent hemoglobin A1c while she was in the hospital of 5.9. She has wounds on her bilateral lower extremities mostly on the left lateral and right lateral. These  were noted also during her recent hospitalization. The patient states they have been there for a while. She has not been able to wear her stockings he has trouble getting them on as she lives alone. She also has external compression pumps but uses them sparingly because it causes pain in her knees which hurt because of osteoarthritis. The patient was on doxycycline before she went into the hospital apparently for fear of infection in her legs and is on Keflex coming out because of a UTI The patient was recently in the hospital from 01/06/2019 through 01/09/2019. She was admitted with nausea and vomiting and prerenal azotemia. This was corrected with IV fluid her Lasix has been put on hold. Hemoglobin A1c at 5.9 her metformin was discontinued. She was also felt to have a UTI she was prescribed Keflex at 503 times a day she is picking that up today. The patient is not felt to have an arterial issue. ABIs in our clinic were 1.24 on the right and 1.06 on the left. She had formal studies in 2018 at which time her ABIs were 1.21 on the right TBI of 0.76 and on the left 1.35 and 0.75 respectively. Waveforms are on the right were monophasic and biphasic, triphasic and biphasic on the left. She  is tolerated 4 layer compression in the past 10/13. Comes in today having not used her compression pumps /perhaps once in the last week. She has too much edema to consider healing these wounds. We have been using silver alginate 10/20; she tells me he had she used her compression pumps once a day as we asked. Clearly she has edema out of her legs although most of it seems to be settling around her knees which is what she complains about. We have been using silver alginate to the extensive de-epithelialized area on the left lateral calf and a much smaller area on the right lateral extending linearly into the right posterior 10/27; she is using her compression pumps daily. Her edema control is better. We have been using silver alginate to the extensive wound areas on her bilateral lower legs. This includes left anterior left lateral and left posterior. Right anterior right lateral and right posterior. The wounds are not all in the same condition. She has severe bilateral skin damage with the epithelialization, flaking dried epithelium 11/3; she is using her compression pumps 3 times daily. Pretty obvious that her wraps fell down especially on the left leg to mid calf. I think things are improving on the right lateral calf, I do not see anything open posteriorly. The major areas on the left lateral where she has 3 or 4 deeper wounds in the midst of an entire de-epithelialized area. There is erythema here but no tenderness I think this represents venous stasis rather than cellulitis. I debrided the areas on the left lateral calf last week but once again they have tightly adherent debris over the top of them which is disappointing Electronic Signature(s) Signed: 02/07/2019 6:03:12 PM By: Linton Ham MD Entered By: Linton Ham on 02/07/2019 09:19:33 -------------------------------------------------------------------------------- Physical Exam Details Patient Name: Date of Service: [name] [name], MOCK 02/07/2019 8:15 AM Medical Record [id number] Patient Account Number: [id number] Date of Birth/Sex: Treating RN: 07-27-1945 (73 y.o. [name] Primary Care Provider: [name] Other Clinician: Referring Provider: Treating Provider/Extender:Robson, Tenna Child, [place] Siren in Treatment: 4 Constitutional Sitting or standing Blood Pressure is within target range for patient.. Pulse regular and within target range for patient.Marland Kitchen Respirations regular, non-labored and within  target range.. Temperature is normal and within the target range for the patient.Marland Kitchen Appears in no distress. Eyes Conjunctivae clear. No discharge.no icterus. Respiratory work of breathing is normal. Cardiovascular Pedal pulses palpable. Lymphatic None palpable in the popliteal area.. 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 Integumentary (Hair, Skin) Severe bilateral lower extremity lymphedema hemosiderin deposition.Marland Kitchen Psychiatric appears at normal baseline. Notes Wound exam; she has 3 open areas on the left lateral calf with some depth. Surrounding skin looks inflamed de- epithelialized over a large area. The open areas once again have very tightly adherent debris which I removed with a #3 curette. Is fairly obvious where the wrap fell down to her mid calf on the left no doubt this contributed to the unchanged status of the left leg. The area on the right lower leg lateral calf is superficial this appears better to me Electronic Signature(s) Signed: 02/07/2019 6:03:12 PM By: Linton Ham MD Entered By: Linton Ham on 02/07/2019 09:26:13 -------------------------------------------------------------------------------- Physician Orders Details Patient Name: Date of Service: ELESIA, NORRICK 02/07/2019 8:15 AM Medical Record [id number] Patient Account Number: [id number] Date of Birth/Sex: Treating RN: Jul 26, 1945 (73 y.o. [name] Primary  Care Provider: [name] Other Clinician: Referring Provider: Treating Provider/Extender:Robson, Tenna Child, [place] Siren in Treatment: 4 Verbal / Phone Orders: No Diagnosis Coding ICD-10 Coding Code Description I89.0 Lymphedema, not elsewhere classified L97.221 Non-pressure chronic ulcer of left calf limited to breakdown of skin L97.211 Non-pressure chronic ulcer of right calf limited to breakdown of skin E11.622 Type 2 diabetes mellitus with other skin ulcer Follow-up Appointments Return Appointment in 1 week. Skin Barriers/Peri-Wound Care Barrier cream Moisturizing lotion TCA Cream or Ointment Wound Cleansing May shower and wash wound with soap and water. Primary Wound Dressing Wound #28 Right,Lateral Lower Leg Hydrofera Blue - ready Wound #29 Right,Posterior Lower Leg Hydrofera Blue - ready Wound #31 Right,Distal,Posterior Lower Leg Hydrofera Blue - ready Wound #32 Left,Anterior Lower Leg Hydrofera Blue - ready Wound #33 Left,Proximal,Lateral Lower Leg Hydrofera Blue - ready Wound #34 Left,Distal,Lateral Lower Leg Hydrofera Blue - ready Secondary Dressing Wound #28 Right,Lateral Lower Leg ABD pad Wound #29 Right,Posterior Lower Leg ABD pad Wound #31 Right,Distal,Posterior Lower Leg ABD pad Wound #32 Left,Anterior Lower Leg ABD pad Wound #33 Left,Proximal,Lateral Lower Leg ABD pad Wound #34 Left,Distal,Lateral Lower Leg ABD pad Edema Control 4 layer compression - Bilateral - unna boot at top to help secure wrap Electronic Signature(s) Signed: 02/07/2019 6:03:12 PM By: Linton Ham MD Signed: 03/15/2019 12:05:18 PM By: Carlene Coria RN Entered By: Carlene Coria on 02/07/2019 09:00:34 -------------------------------------------------------------------------------- Problem List Details Patient Name: Date of Service: [name] [name], AIKEN 02/07/2019 8:15 AM Medical Record [id number] Patient Account Number: [id number] Date of  Birth/Sex: Treating RN: 30-Dec-1945 (73 y.o. Voncille Lo, [place]rib[place] Primary Care Provider: [name] Other Clinician: Referring Provider: Treating Provider/Extender:Robson, Tenna Child, [place] Siren in Treatment: 4 Active Problems ICD-10 Evaluated Encounter Code Description Active Date Today Diagnosis I89.0 Lymphedema, not elsewhere classified 01/10/2019 No Yes L97.221 Non-pressure chronic ulcer of left calf limited to 01/10/2019 No Yes breakdown of skin L97.211 Non-pressure chronic ulcer of right calf limited to 01/10/2019 No Yes breakdown of skin E11.622 Type 2 diabetes mellitus with other skin ulcer 01/10/2019 No Yes Inactive Problems Resolved Problems Electronic Signature(s) Signed: 02/07/2019 6:03:12 PM By: Linton Ham MD Entered By: Linton Ham on 02/07/2019 09:18:04 -------------------------------------------------------------------------------- Progress Note Details Patient Name: Date of Service: [name] [name], Joyel 02/07/2019 8:15 AM Medical Record [id number] Patient Account Number: [id number] Date of Birth/Sex: Treating RN: January 22, 1946 (73 y.o. F) Epps,  Morey Hummingbird Primary Care Provider: [name] Other Clinician: Referring Provider: Treating Provider/Extender:Robson, Tenna Child, [place] Siren in Treatment: 4 Subjective History of Present Illness (HPI) 04/09/15; the patient is here for review of wounds on her bilateral lower extremities for the last month. The patient has a history of lymphedema and bilateral chronic venous insufficiency with inflammation she was here for review of wounds on her bilateral legs in 2014 she was discharged home with external compression pumps which she is currently not using. Over the last month she developed wounds on her bilateral lower legs with drainage and pain and she is here for our review of this. 04/16/15; the patient's edema is under much better control. She has 3 areas one on the right anterior medial  leg one on the left posterior leg and a small open area with purulent drainage on the left anterior leg. I have cultured the area on the left anterior leg 04/23/15; continued improvement in the patient's edema. All her wounds of closed I see no open areas. Culture from the left anterior leg last time was negative. READMISSION 02/07/16; this patient is a woman that I saw her earlier in 2017. I believe we discharged her very quickly with bilateral wounds in her legs secondary to chronic venous hypertension with inflammation and secondary lymphedema. She had juxtalite stockings. She has external compression pumps at home from a stay in our clinic in 2014. She tells me that she's had wounds on her bilateral lower legs since March or April of this year. 3 wounds on the left 2 on the right. She is a type II diabetic and has a history of noncompressible vessels bilaterally although she is tolerated 4-layer compression. The last note from her primary physician Dr. [name] was in May. At that point Dr. [name] had referred the patient here for lymphedema management. The patient stated she made a phone call to year but was not able to arrange an appointment. It is not clear that she is actually using anything on the wounds currently except some gauze that was saturated with drainage per our intake nurse. She is certainly not using her juxtalite stockings and by the patient's own admission she is not using her compression pumps because they are "irritating". I don't see a recent hemoglobin A1c. The patient states she has not been systemically unwell but the legs are painful 02/14/16; patient tolerated her Profore wraps reasonably well. She is using the compression pumps 1 time per day. Measurements of her legs are better in terms of the amount of lymphedema 02/21/16; patient tolerated her Profore wraps it. She says she is using her compression pumps one or 2 times a day. Measurements of her leg  circumference are improved and most of her wound dimensions are better, unfortunately the home health that we referred her to did not except her insurance but works successful in getting her accompanied that would except her insurance however she apparently has a $25 co-pay which she cannot afford as she is on a fixed income 03/05/16; patient states she did not back using the pumps. She is healed that 2 wounds on the left leg. She still has areas on the right lateral lower leg, right medial lower leg and left lateral lower leg. All of these look some better and have reduced in area 03/13/16; still 3 wounds 1 on the left lateral leg, one on the left medial/posterior leg which is really the most extensive and a small area on the right lateral leg. All  of this in the setting of severe chronic lymphedema and chronic venous inflammation. Damage to the skin with cobblestone thickened skin. 03/20/16; still 2 areas. The area on the left lateral leg appears to of closed over. The right medial leg is still most extensive wound although it appears to be closing over right lateral only a small open area remains. Using silver alginate Profore wraps 03/27/16; the area on the left leg remains closed. The edema control is better on the left than the right. The right medial leg is still weeping but everything appears to be epithelialized. The lateral aspect of the right leg appears closed. She tells me she has old juxtalite stockings. She has been compliant with her compression pumps. 04/03/16; the area on the left leg remains closed. She has ordered new juxtalite stockings. The right posterior medial wound is fully epithelialized. However she has an irritated area over the right Achilles that she states is been itching all week under the wraps.. She has been compliant with her compression pumps 04/10/16; the area on the left leg remains closed. The right posterior medial wound is also closed today. The  irritated area over the right Achilles which was probably wrap injury is also fully epithelialized and closed. The patient has not received her new juxtalite stockings, she arrives in clinic today with an old juxtalite stocking in place. Sheis using her compression pumps secondary to her severe stage III lymphedema READMISSION 01/21/17; this is a patient we know from several previous stays in this clinic. She has lymphedema with chronic venous insufficiency and inflammation. The last time she was here we discharged her with bilateral juxta lites which she is compliant with. She also has external compression pumps at home from stays in our clinic in 2014 although she tells me that she is not very compliant with these as when she uses them a causes pain in her bilateral knees the patient states that her wounds on her left greater than right leg including 2 large areas on the left anterior and left lateral leg and an area on the right leg open in late June or July since then she is been followed by [facility] and she's had bilateral Unna boot supplied every week for the last month.. I think the wounds have actually improved although they are not specifically dressed. She states she had an ultrasound to rule out DVT bilaterally that was negative. She does not have arterial studies although she is a diabetic. She states she has a lot of pain in her legs she states she does not use her external compression pumps because it causes pain in her knees. As usual her ABIs were noncompressible bilaterally in this clinic 01/28/17; currently the patient only has one small wound on the left lateral leg and I think this should be healed next week. We are going to try to get her bilateral extremitease stockings. She tells me that compression pumps heard her posterior rib arthritic knees so she is not been compliant with this 02/04/17; the patient's wounds are totally healed. She has chronic venous insufficiency  and secondary lymphedema. She has new extremitease stockings. She also has compression pumps Readmission: 04/21/17 on evaluation today patient appears to be doing about the same as what she was previous when we were evaluating her back in November 2018. At that point she had completely healed. With that being said she has now reopened and states that she wasn't aware that she was supposed to come back. She unfortunately  is having a difficult time utilizing her lymphedema pumps as she states it hurts her knees where she has bone on bone osteoarthritis. Subsequently she also states that although she has the compression that we got for her previously the extremitease stockings she nonetheless doesn't always wear these and being noncompliant often has things will reopen as far as ulcers on her lower extremities. No fevers, chills, nausea, or vomiting noted at this time. Patient has no evidence of dementia. She is having some discomfort in regard to the ulcers at this point. 04/28/17 on evaluation today patient appears to be doing significantly better in regard to her wounds. With that being said she is still having significant discomfort and is not really keen on sharp debridement. She does not appear to have any evidence of infection which is good news I do think the Iodoflex is beneficial for her. Of note her ABI's were found in the system to be on the right 1.21 with a TBI of 0.76 and on the left 1.35 with a TBI of 0.75. In general her swelling appears to be significantly improved however. No fevers, chills, nausea, or vomiting noted at this time. Patient has been tolerating the wraps without complication. She has no evidence of dementia. 05/05/17-she is here in follow-up evaluation for bilateral lower extremity venous and lymphedema ulcers. She states she has not been using her lymphedema pumps secondary to pain at her knees. After discussion it was noted that she uses knee-high lymphedema pumps but  does have a pair of thigh-high lymphedema pumps. She was strongly advised to use the thigh-high lymphedema pumps and if necessary reduce the setting for improved tolerance. She reports no change in odor and/or drainage. She continues to tolerate sharp debridement poorly with significant pain, primarily to the right lower extremity. We will continue with current treatment plan, along with improved lymphedema pump use and follow-up next week 05/12/17 on evaluation today patient actually appears to be doing much better in regard to her bilateral lower extremity ulcers. Only the right altar actually requires debridement today. The other two cleaned off nicely in two of the three in fact on the left lower extremity or actually almost completely healed. She does have some weeping noted still the bilateral lower extremities left greater than right. She still is not able to use her compression pumps even though I have mentioned this several times to her as did Leah last week. No fevers, chills, nausea, or vomiting noted at this time. Patient has been tolerating the dressing changes without complication. Patient has no evidence of dementia 05/19/17 on evaluation today patient appears to be doing very well regard to her bilateral lower extremity ulcers. In fact she seems to be improving and in fact healed in a couple of locations that were giving her trouble last week. Overall I'm extremely pleased with how things have progressed. She still has some discomfort especially in the right lateral lower extremity. Fortunately there does not appear to be any evidence of significant infection which is good news she does tell me that she has used her compression pumps several times although not routinely over the past week I did praise her for this. I do think it will be beneficial and that might even be what's helping with her wounds as far as healing is concerned at least to a degree. No fevers, chills, nausea, or  vomiting noted at this time. Patient has no dementia and no evidence of infection. 05/26/17 on evaluation today patient presents for  evaluation concerning her bilateral lower extremity ulcer secondary to lymphedema. Fortunately she appears to be doing very well at this point in time. Specifically her right leg appears to be completely closed and healed. The left leg though there is still a small area of opening overall has healed. She does have a little bit of excoriation at the top or the wraps are because they slid down on her. Otherwise there's no evidence of infection and the other has improved dramatically now that she is not draining as significantly. Patient is having no significant pain she tells me she has been able to use her lymphedema pumps one time a day although that's not all she can tolerate. Even that is difficult for her. 06/02/17 on evaluation today patient's ulcers appear to be completely closed at this point. She does still note some odor but I believe this is due to the fact she has not been able to wash her legs as well currently. Fortunately she did bring her compression with her today. She does have the EXTREMIT-EASE Compression. With that being said she is somewhat nervous about going back to this as previously she broke out yet again once we discontinue wrapping her. READMISSION 11/23/17; patient is now 73 year old diabetic woman with severe chronic venous insufficiency with lymphedema. We care for her last in this clinic in February 2019 at which time she had chronic venous insufficiency with lymphedema bilateral lower extremity ulcers. We discharged her with bilateral wrap around/extremitease stockings and compression pumps. Is fairly clear she is not using the compression pumps stating it causes pain in her knees. In any case she was admitted to hospital from 8/3 through 8/6 with a several week history of recurrent bilateral ulcers. She was felt to have bilateral  cellulitis treated with doxycycline IV. She is still on oral doxycycline and has another 2 days worth. At discharge her white count was 14.1 hemoglobin at 8.9. The patient is a diabetic however her last ABIs in 2018 showed a right ABI of 1.21, left of 1.35. TBI's were 0.76 on the right 0.75 on the left. Posterior tibial artery was monophasic on the right triphasic on the left. Dorsalis pedis triphasic on the right and triphasic on the left. She is not felt to have significant macrovascular disease 12/02/17;significant bilateral lower extremity wounds secondary to severe uncontrolled lymphedema. She did not get home health out to change the wraps. So she kept this on all week. She is telling me she using his her compression pumps once a week. In general all the wounds look better. We've been using silver alginate with secondary absorptive dressings 4 layer compression 12/10/17; bilateral lower extremity wounds secondary to uncontrolled lymphedema. She tells me she is using her compression pumps on most days. We've been keeping wraps on all week. Using silver alginate. She has wounds on the right lateral calf left medial and left lateral calf. All of this is somewhat better 12/16/17; the patient has 3 small open areas. One on the right lateral calf, one on the left medial calf and the small area on the left lateral calf. She has lymphedema, chronic venous insufficiency with hemosiderin deposition. Very fibrotic distal lower extremity scan with suggestion of an inverted bottle sign appearance to her legs. She has compression pumps but she does not use them has not used them in the last week. Currently we have her in 4 layer compression, silver alginate to the wounds and this is being changed once by home health. She is noncompliant  with the compression pumps because she says it hurts her knees. She also has compression stockings ando Juxta light stockings at home 12/24/17; the patient's right lateral calf  is healed as well as the left medial. There is no open wound on the right leg. She still has 2 areas on the left lateral calf. We transitioned the right leg into an extremitease stocking which she brought in from home. 12/31/17; the patient has a superficial wound on the left lateral lower calf. We've been wrapping this leg. She arrives today with the right leg swollen but without a wound. She cannot get her extremities stocking on the right leg. There is a lot more edema here. She is not using her compression pumps 01/10/2018; patient has a same superficial wound on the left lateral calf. Her right leg has less peripheral edema. She tells me she also started herself on some Lasix that had been previously prescribed for her at some point but she had never taken it. She is also concerned about whether her compression pumps are leaking. She has extremitease stockings however the edema gets bad enough she can get these on. Currently she has no open wound on the right leg and is mention the edema is actually better than when I saw this 10 days ago 01/17/2018; patient has a same superficial area on the posterior left calf and a weeping area in the mid part of the right lateral calf. She still does not have anyone from [facility] coming to see her compression pumps which she describes as being suspicious for a leakage. I am not really sure how frequently she is using this in any case. She has massive bilateral calf lymphedema. I think there is some spreading lymphedema into her posterior thighs. 01/24/2018; the patient's edema in her legs is actually some better. She still has a smaller area on the left posterior calf although this is better. The weeping area on the right lateral calf is also better. As it turned out she did not get her pumps from [facility] but [facility] and they are going to try to go out and see these this week which is going to help. I explained to her that she  will use the compression pumps once a day while we are wrapping her legs but when she transitions to stockings that may be she will require pumping twice a day. Her compliance in this area has not been high and I wonder about her ability to do this herself. 02/01/2018; the area on the posterior left leg is healed. She still has the original wound on the right lateral calf which is epithelialized. The however just above this there is still weeping lymphedema fluid. She has a new area on the upper right calf which is either wrap injury or is she points out where she has been scratching under the wraps. We still not have managed to get a hold of a representative of [facility] to see if the pump is operational. She has extremitease stockings at home however I am not of the opinion that this will maintain her skin integrity without the compression pumps. 02/08/2018; posterior left leg remains healed although she has a new small open area on the left lateral calf. Right lateral calf has two quarter sized open areas and close juxtaposition. There is less weeping edema fluid. She apparently managed to contact [facility]. They are sending her a part. But she is still not received it 02/15/2018; the patient has  no open wound on either leg. Some scale present on the right upper lateral calf just below the fibular head. Edema control is excellent. She did not bring her stockings or her juxta lite wrap around. She has the part for her compression pump but she has not use the compression pumps yet READMISSION 01/10/2019 Patient is now a 73 year old woman that we have had in the clinic multiple times in the past. She has chronic lower extremity lymphedema, recurrent wounds on her bilateral lower extremities. She is also a type II diabetic reasonably well controlled with a recent hemoglobin A1c while she was in the hospital of 5.9. She has wounds on her bilateral lower extremities mostly on the left  lateral and right lateral. These were noted also during her recent hospitalization. The patient states they have been there for a while. She has not been able to wear her stockings he has trouble getting them on as she lives alone. She also has external compression pumps but uses them sparingly because it causes pain in her knees which hurt because of osteoarthritis. The patient was on doxycycline before she went into the hospital apparently for fear of infection in her legs and is on Keflex coming out because of a UTI The patient was recently in the hospital from 01/06/2019 through 01/09/2019. She was admitted with nausea and vomiting and prerenal azotemia. This was corrected with IV fluid her Lasix has been put on hold. Hemoglobin A1c at 5.9 her metformin was discontinued. She was also felt to have a UTI she was prescribed Keflex at 503 times a day she is picking that up today. The patient is not felt to have an arterial issue. ABIs in our clinic were 1.24 on the right and 1.06 on the left. She had formal studies in 2018 at which time her ABIs were 1.21 on the right TBI of 0.76 and on the left 1.35 and 0.75 respectively. Waveforms are on the right were monophasic and biphasic, triphasic and biphasic on the left. She is tolerated 4 layer compression in the past 10/13. Comes in today having not used her compression pumps /perhaps once in the last week. She has too much edema to consider healing these wounds. We have been using silver alginate 10/20; she tells me he had she used her compression pumps once a day as we asked. Clearly she has edema out of her legs although most of it seems to be settling around her knees which is what she complains about. We have been using silver alginate to the extensive de-epithelialized area on the left lateral calf and a much smaller area on the right lateral extending linearly into the right posterior 10/27; she is using her compression pumps daily. Her edema  control is better. We have been using silver alginate to the extensive wound areas on her bilateral lower legs. This includes left anterior left lateral and left posterior. Right anterior right lateral and right posterior. The wounds are not all in the same condition. She has severe bilateral skin damage with the epithelialization, flaking dried epithelium 11/3; she is using her compression pumps 3 times daily. Pretty obvious that her wraps fell down especially on the left leg to mid calf. I think things are improving on the right lateral calf, I do not see anything open posteriorly. The major areas on the left lateral where she has 3 or 4 deeper wounds in the midst of an entire de-epithelialized area. There is erythema here but no tenderness I think  this represents venous stasis rather than cellulitis. I debrided the areas on the left lateral calf last week but once again they have tightly adherent debris over the top of them which is disappointing Objective Constitutional Sitting or standing Blood Pressure is within target range for patient.. Pulse regular and within target range for patient.Marland Kitchen Respirations regular, non-labored and within target range.. Temperature is normal and within the target range for the patient.Marland Kitchen Appears in no distress. Vitals Time Taken: 8:09 AM, Height: 71 in, Weight: 240 lbs, BMI: 33.5, Temperature: 97.9 F, Pulse: 76 bpm, Respiratory Rate: 18 breaths/min, Blood Pressure: 142/87 mmHg. Eyes Conjunctivae clear. No discharge.no icterus. Respiratory work of breathing is normal. Cardiovascular Pedal pulses palpable. Lymphatic None palpable in the popliteal area.. 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 Psychiatric appears at normal baseline. General Notes: Wound exam; she has 3 open areas on the left lateral calf with some depth. Surrounding skin looks inflamed de-epithelialized over a large area. The open areas once again have very tightly  adherent debris which I removed with a #3 curette. Is fairly obvious where the wrap fell down to her mid calf on the left no doubt this contributed to the unchanged status of the left leg. ooThe area on the right lower leg lateral calf is superficial this appears better to me Integumentary (Hair, Skin) Severe bilateral lower extremity lymphedema hemosiderin deposition.. Wound #28 status is Open. Original cause of wound was Gradually Appeared. The wound is located on the Right,Lateral Lower Leg. The wound measures 5cm length x 3cm width x 0.1cm depth; 11.781cm^2 area and 1.178cm^3 volume. There is Fat Layer (Subcutaneous Tissue) Exposed exposed. There is no tunneling or undermining noted. There is a medium amount of purulent drainage noted. The wound margin is flat and intact. There is large (67-100%) red granulation within the wound bed. There is a small (1-33%) amount of necrotic tissue within the wound bed including Adherent Slough. Wound #29 status is Open. Original cause of wound was Gradually Appeared. The wound is located on the Right,Posterior Lower Leg. The wound measures 4.5cm length x 4cm width x 0.1cm depth; 14.137cm^2 area and 1.414cm^3 volume. There is Fat Layer (Subcutaneous Tissue) Exposed exposed. There is no tunneling or undermining noted. There is a medium amount of purulent drainage noted. The wound margin is flat and intact. There is large (67-100%) red granulation within the wound bed. There is a small (1-33%) amount of necrotic tissue within the wound bed including Adherent Slough. Wound #30 status is Open. Original cause of wound was Gradually Appeared. The wound is located on the Right,Distal,Lateral Lower Leg. The wound measures 0cm length x 0cm width x 0cm depth; 0cm^2 area and 0cm^3 volume. There is no tunneling or undermining noted. There is a none present amount of drainage noted. The wound margin is flat and intact. There is no granulation within the wound bed.  There is no necrotic tissue within the wound bed. Wound #31 status is Open. Original cause of wound was Gradually Appeared. The wound is located on the Right,Distal,Posterior Lower Leg. The wound measures 2cm length x 3.1cm width x 0.1cm depth; 4.869cm^2 area and 0.487cm^3 volume. There is Fat Layer (Subcutaneous Tissue) Exposed exposed. There is no tunneling or undermining noted. There is a medium amount of purulent drainage noted. The wound margin is flat and intact. There is large (67-100%) red granulation within the wound bed. There is a small (1-33%) amount of necrotic tissue within the wound bed including Adherent Slough. Wound #32 status is  Open. Original cause of wound was Gradually Appeared. The wound is located on the Left,Anterior Lower Leg. The wound measures 0.6cm length x 0.3cm width x 0.1cm depth; 0.141cm^2 area and 0.014cm^3 volume. There is Fat Layer (Subcutaneous Tissue) Exposed exposed. There is no tunneling or undermining noted. There is a medium amount of purulent drainage noted. The wound margin is flat and intact. There is medium (34-66%) granulation within the wound bed. There is a medium (34-66%) amount of necrotic tissue within the wound bed including Adherent Slough. Wound #33 status is Open. Original cause of wound was Gradually Appeared. The wound is located on the Left,Proximal,Lateral Lower Leg. The wound measures 3cm length x 9.5cm width x 0.1cm depth; 22.384cm^2 area and 2.238cm^3 volume. There is Fat Layer (Subcutaneous Tissue) Exposed exposed. There is no tunneling or undermining noted. There is a medium amount of purulent drainage noted. The wound margin is flat and intact. There is medium (34-66%) red, pink granulation within the wound bed. There is a medium (34-66%) amount of necrotic tissue within the wound bed including Adherent Slough. Wound #34 status is Open. Original cause of wound was Gradually Appeared. The wound is located on the Left,Distal,Lateral  Lower Leg. The wound measures 1.5cm length x 1.6cm width x 0.1cm depth; 1.885cm^2 area and 0.188cm^3 volume. There is Fat Layer (Subcutaneous Tissue) Exposed exposed. There is no tunneling or undermining noted. There is a medium amount of purulent drainage noted. The wound margin is flat and intact. There is medium (34-66%) red granulation within the wound bed. There is a medium (34-66%) amount of necrotic tissue within the wound bed including Adherent Slough. Wound #35 status is Open. Original cause of wound was Gradually Appeared. The wound is located on the [facility] Lower Leg. The wound measures 0cm length x 0cm width x 0cm depth; 0cm^2 area and 0cm^3 volume. There is no tunneling or undermining noted. There is a none present amount of drainage noted. The wound margin is distinct with the outline attached to the wound base. There is no granulation within the wound bed. There is no necrotic tissue within the wound bed. Assessment Active Problems ICD-10 Lymphedema, not elsewhere classified Non-pressure chronic ulcer of left calf limited to breakdown of skin Non-pressure chronic ulcer of right calf limited to breakdown of skin Type 2 diabetes mellitus with other skin ulcer Procedures Wound #33 Pre-procedure diagnosis of Wound #33 is a Lymphedema located on the Left,Proximal,Lateral Lower Leg .Severity of Tissue Pre Debridement is: Fat layer exposed. There was a Excisional Skin/Subcutaneous Tissue Debridement with a total area of 28.5 sq cm performed by Ricard Dillon., MD. With the following instrument(s): Curette to remove Viable tissue/material. Material removed includes Subcutaneous Tissue. No specimens were taken. A time out was conducted at 08:56, prior to the start of the procedure. A Minimum amount of bleeding was controlled with Pressure. The procedure was tolerated well with a pain level of 3 throughout and a pain level of 0 following the procedure. Post Debridement  Measurements: 3cm length x 6cm width x 0.1cm depth; 1.414cm^3 volume. Character of Wound/Ulcer Post Debridement is improved. Severity of Tissue Post Debridement is: Fat layer exposed. Post procedure Diagnosis Wound #33: Same as Pre-Procedure Wound #34 Pre-procedure diagnosis of Wound #34 is a Lymphedema located on the Left,Distal,Lateral Lower Leg .Severity of Tissue Pre Debridement is: Fat layer exposed. There was a Excisional Skin/Subcutaneous Tissue Debridement with a total area of 2.4 sq cm performed by Ricard Dillon., MD. With the following instrument(s): Curette to remove  Viable tissue/material. Material removed includes Subcutaneous Tissue after achieving pain control using Other (benzocaine 20%). No specimens were taken. A time out was conducted at 08:56, prior to the start of the procedure. A Minimum amount of bleeding was controlled with Pressure. The procedure was tolerated well with a pain level of 3 throughout and a pain level of 0 following the procedure. Post Debridement Measurements: 1.5cm length x 1.6cm width x 0.1cm depth; 0.188cm^3 volume. Character of Wound/Ulcer Post Debridement is improved. Severity of Tissue Post Debridement is: Fat layer exposed. Post procedure Diagnosis Wound #34: Same as Pre-Procedure Plan Follow-up Appointments: Return Appointment in 1 week. Skin Barriers/Peri-Wound Care: Barrier cream Moisturizing lotion TCA Cream or Ointment Wound Cleansing: May shower and wash wound with soap and water. Primary Wound Dressing: Wound #28 Right,Lateral Lower Leg: Hydrofera Blue - ready Wound #29 Right,Posterior Lower Leg: Hydrofera Blue - ready Wound #31 Right,Distal,Posterior Lower Leg: Hydrofera Blue - ready Wound #32 Left,Anterior Lower Leg: Hydrofera Blue - ready Wound #33 Left,Proximal,Lateral Lower Leg: Hydrofera Blue - ready Wound #34 Left,Distal,Lateral Lower Leg: Hydrofera Blue - ready Secondary Dressing: Wound #28 Right,Lateral Lower  Leg: ABD pad Wound #29 Right,Posterior Lower Leg: ABD pad Wound #31 Right,Distal,Posterior Lower Leg: ABD pad Wound #32 Left,Anterior Lower Leg: ABD pad Wound #33 Left,Proximal,Lateral Lower Leg: ABD pad Wound #34 Left,Distal,Lateral Lower Leg: ABD pad Edema Control: 4 layer compression - Bilateral - unna boot at top to help secure wrap 1. TCA 2. Hydrofera Blue/ABDs/4-layer compression bilaterally 3. She is using her pumps once a day I doubt we could get her to do more than that. 4. She may need a lymphedema clinic/lymphedema wrap into the upper thigh area Electronic Signature(s) Signed: 02/07/2019 6:03:12 PM By: Linton Ham MD Entered By: Linton Ham on 02/07/2019 09:27:27 -------------------------------------------------------------------------------- SuperBill Details Patient Name: Date of Service: GANELLE, BERENDS 02/07/2019 Medical Record [id number] Patient Account Number: [id number] Date of Birth/Sex: Treating RN: 1945-05-25 (73 y.o. [name] Primary Care Provider: [name] Other Clinician: Referring Provider: Treating Provider/Extender:Robson, Tenna Child, [place] Siren in Treatment: 4 Diagnosis Coding ICD-10 Codes Code Description I89.0 Lymphedema, not elsewhere classified L97.221 Non-pressure chronic ulcer of left calf limited to breakdown of skin L97.211 Non-pressure chronic ulcer of right calf limited to breakdown of skin E11.622 Type 2 diabetes mellitus with other skin ulcer Facility Procedures CPT4 Code Description: [id number] - DEB SUBQ TISSUE 20 SQ CM/< ICD-10 Diagnosis Description I89.0 Lymphedema, not elsewhere classified L97.221 Non-pressure chronic ulcer of left calf limited to breakd Modifier: own of skin Quantity: 1 Physician Procedures CPT4 Code Description: [id number] - WC PHYS SUBQ TISS 20 SQ CM ICD-10 Diagnosis Description I89.0 Lymphedema, not elsewhere classified L97.221 Non-pressure chronic ulcer of  left calf limited to break Modifier: down of skin Quantity: 1 CPT4 Code Description: [id number] - WC PHYS SUBQ TISS EA ADDL 20 CM ICD-10 Diagnosis Description I89.0 Lymphedema, not elsewhere classified L97.221 Non-pressure chronic ulcer of left calf limited to break Modifier: down of skin Quantity: 1 Electronic Signature(s) Signed: 02/07/2019 6:03:12 PM By: Linton Ham MD Entered By: Linton Ham on 02/07/2019 09:27:52

## 2019-03-15 NOTE — Progress Notes (Signed)
[name] [name], [name] [name] ([id number]) Visit Report for 02/07/2019 Arrival Information Details Patient Name: Date of Service: [name] [name], [name] [name] 02/07/2019 8:15 AM Medical Record [id number] Patient Account Number: [id number] Date of Birth/Sex: Treating RN: 12/30/1945 (73 y.o. Clearnce Sorrel Primary Care Tamika Shropshire: [name] Other Clinician: Referring Galilea Quito: Treating Haley Fuerstenberg/Extender:Robson, Tenna Child, [place] Siren in Treatment: 4 Visit Information History Since Last Visit Walker Added or deleted any medications: No Patient Arrived: Any new allergies or adverse reactions: No Arrival Time: 08:09 Had a fall or experienced change in No Accompanied By: self activities of daily living that may affect Transfer Assistance: None risk of falls: Patient Identification Verified: Yes Signs or symptoms of abuse/neglect since last No Secondary Verification Process Completed: Yes visito Patient Requires Transmission-Based No Hospitalized since last visit: No Precautions: Implantable device outside of the clinic excluding No Patient Has Alerts: No cellular tissue based products placed in the center since last visit: Has Dressing in Place as Prescribed: Yes Has Compression in Place as Prescribed: Yes Pain Present Now: Yes Electronic Signature(s) Signed: 02/09/2019 5:16:49 PM By: Kela Millin Entered By: Kela Millin on 02/07/2019 08:10:45 -------------------------------------------------------------------------------- Encounter Discharge Information Details Patient Name: Date of Service: [name] [name], [name] [name] 02/07/2019 8:15 AM Medical Record [id number] Patient Account Number: [id number] Date of Birth/Sex: Treating RN: March 09, 1946 (74 y.o. Clearnce Sorrel Primary Care Doyal Saric: [name] Other Clinician: Referring Hallie Ishida: Treating Leiah Giannotti/Extender:Robson, Tenna Child, [place] Siren in Treatment: 4 Encounter Discharge Information  Items Post Procedure Vitals Discharge Condition: Stable Temperature (F): 97.9 Ambulatory Status: Walker Pulse (bpm): 76 Discharge Destination: Home Respiratory Rate (breaths/min): 18 Transportation: Other Blood Pressure (mmHg): 142/87 Accompanied By: self Schedule Follow-up Appointment: Yes Clinical Summary of Care: Patient Declined Electronic Signature(s) Signed: 02/09/2019 5:16:49 PM By: Kela Millin Entered By: Kela Millin on 02/07/2019 09:34:31 -------------------------------------------------------------------------------- Lower Extremity Assessment Details Patient Name: Date of Service: [name] [name], [name] [name] 02/07/2019 8:15 AM Medical Record [id number] Patient Account Number: [id number] Date of Birth/Sex: Treating RN: [birth year] (73 y.o. Clearnce Sorrel Primary Care Orlean Holtrop: [name] Other Clinician: Referring Dawnelle Warman: Treating Jesenya Bowditch/Extender:Robson, Tenna Child, [place] Siren in Treatment: 4 Edema Assessment Assessed: [Left: No] [Right: No] Edema: [Left: Yes] [Right: Yes] Calf Left: Right: Point of Measurement: 30 cm From Medial Instep 62.3 cm 61 cm Ankle Left: Right: Point of Measurement: 8 cm From Medial Instep 31.5 cm 36.5 cm Vascular Assessment Pulses: Dorsalis Pedis Palpable: [Left:Yes] [Right:Yes] Electronic Signature(s) Signed: 02/09/2019 5:16:49 PM By: Kela Millin Entered By: Kela Millin on 02/07/2019 08:17:15 -------------------------------------------------------------------------------- Multi Wound Chart Details Patient Name: Date of Service: [name] [name], [name] [name] 02/07/2019 8:15 AM Medical Record [id number] Patient Account Number: [id number] Date of Birth/Sex: Treating RN: March 01, 1946 (74 y.o. [name] Primary Care Kazi Montoro: Other Clinician: [name] Referring Zemira Zehring: Treating Kajal Scalici/Extender:Robson, Tenna Child, [place] Siren in Treatment: 4 Vital Signs Height(in):  52 Pulse(bpm): 31 Weight(lbs): 240 Blood Pressure(mmHg): 142/87 Body Mass Index(BMI): 33 Temperature(F): 97.9 Respiratory 18 Rate(breaths/min): Photos: [28:No Photos] [29:No Photos] [30:No Photos] Wound Location: [28:Right Lower Leg - Lateral] [29:Right Lower Leg - Posterior Right Lower Leg - Lateral,] [30:Distal] Wounding Event: [28:Gradually Appeared] [29:Gradually Appeared] [30:Gradually Appeared] Primary Etiology: [28:Lymphedema] [29:Lymphedema] [30:Lymphedema] 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] [29:Lower Extremity] [30:Lower Extremity] Comorbid History: [28:Cataracts, Lymphedema, Cataracts, Lymphedema, Cataracts, Lymphedema, Arrhythmia, Congestive Heart Failure, Hypertension, Heart Failure, Hypertension, Heart Failure, Hypertension, Peripheral Venous Disease, Peripheral Venous Disease,  Peripheral Venous Disease, Type II Diabetes, Osteoarthritis, Neuropathy, Osteoarthritis, Neuropathy, Osteoarthritis, Neuropathy, Confinement Anxiety] [29:Arrhythmia, Congestive Type II Diabetes, Confinement Anxiety] [30:Arrhythmia, Congestive Type  II  Diabetes, Confinement Anxiety] Date Acquired: [28:08/05/2018] [29:08/05/2018] [30:08/05/2018] Weeks of Treatment: [28:4] [29:4] [30:4] Wound Status: [28:Open] [29:Open] [30:Open] Clustered Wound: [28:No] [29:No] [30:No] Clustered Quantity: [28:N/A] [29:N/A] [30:N/A] Measurements L x W x D 5x3x0.1 [29:4.5x4x0.1] [30:0x0x0] (cm) Area (cm) : [28:11.781] [id number] [30:0] Volume (cm) : [28:1.178] [29:1.414] [30:0] % Reduction in Area: [28:67.00%] [29:-700.10%] [30:100.00%] % Reduction in Volume: 67.00% [29:-698.90%] [30:100.00%] Classification: [28:Full Thickness Without Exposed Support Structures Exposed Support Structures Exposed Support Structures] [29:Full Thickness Without] [30:Full Thickness Without] Exudate Amount: [28:Medium] [29:Medium] [30:None  Present] Exudate Type: [28:Purulent] [29:Purulent] [30:N/A] Exudate Color: [28:yellow, brown, green] [29:yellow, brown, green] [30:N/A] Wound Margin: [28:Flat and Intact] [29:Flat and Intact] [30:Flat and Intact] Granulation Amount: [28:Large (67-100%)] [29:Large (67-100%)] [30:None Present (0%)] Granulation Quality: [28:Red] [29:Red] [30:N/A] Necrotic Amount: [28:Small (1-33%)] [29:Small (1-33%)] [30:None Present (0%)] Exposed Structures: [28:Fat Layer (Subcutaneous Fat Layer (Subcutaneous Fascia: No Tissue) Exposed: Yes Fascia: No Tendon: No Muscle: No Joint: No Bone: No] [29:Tissue) Exposed: Yes Fascia: No Tendon: No Muscle: No Joint: No Bone: No] [30:Fat Layer (Subcutaneous Tissue)  Exposed: No Tendon: No Muscle: No Joint: No Bone: No] Epithelialization: [28:Small (1-33%) 31] [29:Small (1-33%)] [30:Large (67-100%) 21 33] Photos: [28:No Photos] [29:No Photos] [30:No Photos] Wound Location: [28:Right Lower Leg - Posterior, Left Lower Leg - Anterior Distal] [30:Left Lower Leg - Lateral, Proximal] Wounding Event: [28:Gradually Appeared] [29:Gradually Appeared] [30:Gradually Appeared] Primary Etiology: [28:Lymphedema] [29:Lymphedema] [30:Lymphedema] 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] [29:Lower Extremity] [30:Lower Extremity] Comorbid History: [28:Cataracts, Lymphedema, Cataracts, Lymphedema, Cataracts, Lymphedema, Arrhythmia, Congestive Heart Failure, Hypertension, Heart Failure, Hypertension, Heart Failure, Hypertension, Peripheral Venous Disease, Peripheral Venous Disease,  Peripheral Venous Disease, Type II Diabetes, Osteoarthritis, Neuropathy, Osteoarthritis, Neuropathy, Osteoarthritis, Neuropathy, Confinement Anxiety] [29:Arrhythmia, Congestive Type II Diabetes, Confinement Anxiety] [30:Arrhythmia, Congestive Type II  Diabetes, Confinement Anxiety] Date Acquired: [28:08/05/2018] [29:08/05/2018]  [30:08/05/2018] Weeks of Treatment: [28:4] [29:4] [30:4] Wound Status: [28:Open] [29:Open] [30:Open] Clustered Wound: [28:No] [29:No] [30:Yes] Clustered Quantity: [28:N/A] [29:N/A] [30:2] Measurements L x W x D 2x3.1x0.1 [29:0.6x0.3x0.1] [30:3x9.5x0.1] (cm) Area (cm) : [28:4.869] [29:0.141] [30:22.384] Volume (cm) : [28:0.487] [29:0.014] [30:2.238] % Reduction in Area: [28:-376.90%] [29:89.10%] [30:35.20%] % Reduction in Volume: -377.50% [29:89.20%] [30:35.20%] Classification: [28:Full Thickness Without Exposed Support Structures Exposed Support Structures Exposed Support Structures] [29:Full Thickness Without] [30:Full Thickness Without] Exudate Amount: [28:Medium] [29:Medium] [30:Medium] Exudate Type: [28:Purulent] [29:Purulent] [30:Purulent] Exudate Color: [28:yellow, brown, green] [29:yellow, brown, green] [30:yellow, brown, green] Wound Margin: [28:Flat and Intact] [29:Flat and Intact] [30:Flat and Intact] Granulation Amount: [28:Large (67-100%)] [29:Medium (34-66%)] [30:Medium (34-66%)] Granulation Quality: [28:Red] [29:N/A] [30:Red, Pink] Necrotic Amount: [28:Small (1-33%)] [29:Medium (34-66%)] [30:Medium (34-66%)] Exposed Structures: [28:Fat Layer (Subcutaneous Fat Layer (Subcutaneous Fat Layer (Subcutaneous Tissue) Exposed: Yes Fascia: No Tendon: No Muscle: No Joint: No Bone: No] [29:Tissue) Exposed: Yes Fascia: No Tendon: No Muscle: No Joint: No Bone: No] [30:Tissue) Exposed: Yes  Fascia: No Tendon: No Muscle: No Joint: No Bone: No] Epithelialization: [28:Small (1-33%) 34] [29:Large (67-100%)] [30:Small (1-33%) 35 N/A] Photos: [28:No Photos] [29:No Photos] [30:N/A] Wound Location: [28:Left Lower Leg - Lateral, Distal] [29:Left Lower Leg - Anterior, N/A Distal] Wounding Event: [28:Gradually Appeared] [29:Gradually Appeared] [30:N/A] Primary Etiology: [28:Lymphedema] [29:Lymphedema] [30:N/A] Secondary Etiology: [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] [30:N/A] Comorbid  History: [28:Cataracts, Lymphedema, Cataracts, Lymphedema, N/A Arrhythmia, Congestive Heart Failure, Hypertension, Heart Failure, Hypertension, Peripheral Venous Disease, Peripheral Venous Disease, Type II Diabetes, Osteoarthritis, Neuropathy,  Osteoarthritis, Neuropathy, Confinement Anxiety] [29:Arrhythmia, Congestive Type II Diabetes, Confinement Anxiety] Date Acquired: [28:01/10/2019] [29:01/24/2019] [30:N/A]  Weeks of Treatment: [28:4] [29:2] [30:N/A] Wound Status: [28:Open] [29:Open] [30:N/A] Clustered Wound: [28:No] [29:No] [30:N/A] Clustered Quantity: [28:N/A] [29:N/A] [30:N/A] Measurements L x W x D [28:1.5x1.6x0.1] [29:0x0x0] [30:N/A] (cm) Area (cm) : [28:1.885] [29:0] [30:N/A] Volume (cm) : [id number] [29:0] [30:N/A] % Reduction in Area: [28:29.80%] [29:100.00%] [30:N/A] % Reduction in Volume: [28:30.10%] [29:100.00%] [30:N/A] Classification: [28:Full Thickness Without Exposed Support Structures Exposed Support Structures] [29:Full Thickness Without] [30:N/A] Exudate Amount: [28:Medium] [29:None Present] [30:N/A] Exudate Type: [28:Purulent] [29:N/A] [30:N/A] Exudate Color: [28:yellow, brown, green] [29:N/A] [30:N/A] Wound Margin: [28:Flat and Intact] [29:Distinct, outline attached N/A] Granulation Amount: [28:Medium (34-66%)] [29:None Present (0%)] [30:N/A] Granulation Quality: [28:Red] [29:N/A] [30:N/A] Necrotic Amount: [28:Medium (34-66%)] [29:None Present (0%)] [30:N/A] Exposed Structures: [28:Fat Layer (Subcutaneous Fascia: No Tissue) Exposed: Yes Fascia: No Tendon: No Muscle: No Joint: No Bone: No Small (1-33%)] [29:Fat Layer (Subcutaneous Tissue) Exposed: No Tendon: No Muscle: No Joint: No Bone: No Large (67-100%)] [30:N/A N/A] Treatment Notes Electronic Signature(s) Signed: 02/07/2019 6:03:12 PM By: Linton Ham MD Signed: 03/15/2019 12:05:18 PM By: Carlene Coria RN Entered By: Linton Ham on 02/07/2019  09:18:13 -------------------------------------------------------------------------------- Multi-Disciplinary Care Plan Details Patient Name: Date of Service: [name] [name], [name] [name] 02/07/2019 8:15 AM Medical Record [id number] Patient Account Number: [id number] Date of Birth/Sex: Treating RN: Sep 03, 1945 (73 y.o. [name] Primary Care Jera Headings: [name] Other Clinician: Referring Dijon Kohlman: Treating Montoya Brandel/Extender:Robson, Tenna Child, [place] Siren in Treatment: 4 Active Inactive Abuse / Safety / Falls / Self Care Management Nursing Diagnoses: Impaired physical mobility Potential for falls Goals: Patient will not develop complications from immobility Date Initiated: 01/10/2019 Target Resolution Date: 02/10/2019 Goal Status: Active Patient will not experience any injury related to falls Date Initiated: 01/10/2019 Target Resolution Date: 02/10/2019 Goal Status: Active Patient/caregiver will verbalize understanding of skin care regimen Date Initiated: 01/10/2019 Target Resolution Date: 02/10/2019 Goal Status: Active Interventions: Assess Activities of Daily Living upon admission and as needed Assess fall risk on admission and as needed Assess: immobility, friction, shearing, incontinence upon admission and as needed Assess impairment of mobility on admission and as needed per policy Assess personal safety and home safety (as indicated) on admission and as needed Assess self care needs on admission and as needed Notes: Wound/Skin Impairment Nursing Diagnoses: Knowledge deficit related to ulceration/compromised skin integrity Goals: Patient/caregiver will verbalize understanding of skin care regimen Date Initiated: 01/10/2019 Target Resolution Date: 02/10/2019 Goal Status: Active Ulcer/skin breakdown will have a volume reduction of 30% by week 4 Date Initiated: 01/10/2019 Target Resolution Date: 02/10/2019 Goal Status: Active Interventions: Assess  patient/caregiver ability to obtain necessary supplies Assess patient/caregiver ability to perform ulcer/skin care regimen upon admission and as needed Assess ulceration(s) every visit Notes: Electronic Signature(s) Signed: 03/15/2019 12:05:18 PM By: Carlene Coria RN Entered By: Carlene Coria on 02/07/2019 08:02:16 -------------------------------------------------------------------------------- Pain Assessment Details Patient Name: Date of Service: [name] [name], [name] [name] 02/07/2019 8:15 AM Medical Record [id number] Patient Account Number: [id number] Date of Birth/Sex: Treating RN: [birth year] (73 y.o. Clearnce Sorrel Primary Care Ivelis Norgard: [name] Other Clinician: Referring Rocky Gladden: Treating Lilymae Swiech/Extender:Robson, Tenna Child, [place] Siren in Treatment: 4 Active Problems Location of Pain Severity and Description of Pain Patient Has Paino Yes Site Locations Pain Location: Pain in Ulcers With Dressing Change: Yes Duration of the Pain. Constant / Intermittento Constant Rate the pain. Current Pain Level: 7 Worst Pain Level: 10 Least Pain Level: 3 Tolerable Pain Level: 3 Character of Pain Describe the Pain: Sharp, Shooting Pain Management and Medication Current Pain Management: Electronic Signature(s) Signed: 02/09/2019 5:16:49 PM By: Kela Millin Entered By: Verita Schneiders,  Larene Beach on 02/07/2019 08:11:54 -------------------------------------------------------------------------------- Patient/Caregiver Education Details Patient Name: Date of Service: [name] [name], [name] [name] 11/3/2020andnbsp8:15 AM Medical Record [id number] Patient Account Number: [id number] Date of Birth/Gender: Treating RN: [birth year] (73 y.o. [name] Primary Care Physician: [name] Other Clinician: Referring Physician: Treating Physician/Extender:Robson, Tenna Child, [place] Siren in Treatment: 4 Education Assessment Education Provided  To: Patient Education Topics Provided Wound/Skin Impairment: Methods: Explain/Verbal Responses: State content correctly Electronic Signature(s) Signed: 03/15/2019 12:05:18 PM By: Carlene Coria RN Entered By: Carlene Coria on 02/07/2019 08:02:30 -------------------------------------------------------------------------------- Wound Assessment Details Patient Name: Date of Service: [name] [name], [name] [name] 02/07/2019 8:15 AM Medical Record [id number] Patient Account Number: [id number] Date of Birth/Sex: Treating RN: Sep 17, 1945 (73 y.o. Clearnce Sorrel Primary Care Isaiah Torok: [name] Other Clinician: Referring Roma Bondar: Treating Shearon Clonch/Extender:Robson, Tenna Child, [place] Siren in Treatment: 4 Wound Status Wound Number: 28 Primary Lymphedema Etiology: Wound Location: Right Lower Leg - Lateral Secondary Diabetic Wound/Ulcer of the Lower Extremity Wounding Event: Gradually Appeared Etiology: Date Acquired: 08/05/2018 Wound Open Weeks Of Treatment: 4 Status: Clustered Wound: No Comorbid Cataracts, Lymphedema, Arrhythmia, History: Congestive Heart Failure, Hypertension, Peripheral Venous Disease, Type II Diabetes, Osteoarthritis, Neuropathy, Confinement Anxiety Photos Wound Measurements Length: (cm) 5 % Reduct Width: (cm) 3 % Reduct Depth: (cm) 0.1 Epitheli Area: (cm) 11.781 Tunneli Volume: (cm) 1.178 Undermi Wound Description Full Thickness Without Exposed Support Foul Od Classification: Structures Slough/ Wound Flat and Intact Margin: Exudate Medium Amount: Exudate Purulent Type: Exudate yellow, brown, green yellow, brown, green Color: Wound Bed Granulation Amount: Large (67-100%) Granulation Quality: Red Fascia Exp Necrotic Amount: Small (1-33%) Fat Layer Necrotic Quality: Adherent Slough Tendon Exp Muscle Exp Joint Expo Bone Expos or After Cleansing: No Fibrino Yes Exposed Structure osed: No (Subcutaneous Tissue) Exposed:  Yes osed: No osed: No sed: No ed: No ion in Area: 67% ion in Volume: 67% alization: Small (1-33%) ng: No ning: No Electronic Signature(s) Signed: 02/09/2019 4:13:56 PM By: Mikeal Hawthorne EMT/HBOT Signed: 02/09/2019 5:16:49 PM By: Kela Millin Entered By: Mikeal Hawthorne on 02/09/2019 09:21:29 -------------------------------------------------------------------------------- Wound Assessment Details Patient Name: Date of Service: [name] [name], [name] [name] 02/07/2019 8:15 AM Medical Record [id number] Patient Account Number: [id number] Date of Birth/Sex: Treating RN: 12/10/1945 (73 y.o. Clearnce Sorrel Primary Care Manus Weedman: [name] Other Clinician: Referring Shondra Capps: Treating Marianne Golightly/Extender:Robson, Tenna Child, [place] Siren in Treatment: 4 Wound Status Wound Number: 29 Primary Lymphedema Etiology: Wound Location: Right Lower Leg - Posterior Secondary Diabetic Wound/Ulcer of the Lower Extremity Wounding Event: Gradually Appeared Etiology: Date Acquired: 08/05/2018 Wound Open Weeks Of Treatment: 4 Status: Clustered Wound: No Comorbid Cataracts, Lymphedema, Arrhythmia, History: Congestive Heart Failure, Hypertension, Peripheral Venous Disease, Type II Diabetes, Osteoarthritis, Neuropathy, Confinement Anxiety Photos Wound Measurements Length: (cm) 4.5 % Reductio Width: (cm) 4 % Reductio Depth: (cm) 0.1 Epitheli Area: (cm) 14.137 Tunneli Volume: (cm) 1.414 Undermi Wound Description Classification: Full Thickness Without Exposed Support Foul Od Structures Slough/ Wound Flat and Intact Margin: Exudate Medium Amount: Exudate Purulent Type: Exudate yellow, brown, green Color: Wound Bed Granulation Amount: Large (67-100%) Granulation Quality: Red Fascia Necrotic Amount: Small (1-33%) Fat Lay Necrotic Quality: Adherent Slough Tendon Muscle Joint E Bone Ex or After Cleansing: No Fibrino Yes Exposed Structure Exposed: No er  (Subcutaneous Tissue) Exposed: Yes Exposed: No Exposed: No xposed: No posed: No n in Area: -700.1% n in Volume: -698.9% alization: Small (1-33%) ng: No ning: No Electronic Signature(s) Signed: 02/09/2019 4:13:56 PM By: Mikeal Hawthorne EMT/HBOT Signed: 02/09/2019 5:16:49 PM By: Kela Millin Entered By: Mikeal Hawthorne on 02/09/2019 09:35:54 -------------------------------------------------------------------------------- Wound Assessment Details Patient  Name: Date of Service: [name] [name], [name] [name] 02/07/2019 8:15 AM Medical Record [id number] Patient Account Number: [id number] Date of Birth/Sex: Treating RN: 11/01/1945 (73 y.o. Clearnce Sorrel Primary Care Prabhjot Piscitello: [name] Other Clinician: Referring Jennavieve Arrick: Treating Macaulay Reicher/Extender:Robson, Tenna Child, [place] Siren in Treatment: 4 Wound Status Wound Number: 30 Primary Lymphedema Etiology: Wound Location: Right Lower Leg - Lateral, Distal Secondary Diabetic Wound/Ulcer of the Lower Extremity Wounding Event: Gradually Appeared Etiology: Date Acquired: 08/05/2018 Wound Healed - Epithelialized Weeks Of Treatment: 4 Status: Clustered Wound: No Comorbid Cataracts, Lymphedema, Arrhythmia, History: Congestive Heart Failure, Hypertension, Peripheral Venous Disease, Type II Diabetes, Osteoarthritis, Neuropathy, Confinement Anxiety Photos Wound Measurements Length: (cm) 0 % Red Width: (cm) 0 % Red Depth: (cm) 0 Epith Area: (cm) 0 Tunn Volume: (cm) 0 Unde Wound Description Classification: Full Thickness Without Exposed Support Structures Wound Flat and Intact Margin: Exudate None Present Amount: Wound Bed Granulation Amount: None Present (0%) Necrotic Amount: None Present (0%) Foul Odor After Cleansing: No Slough/Fibrino No Exposed Structure Fascia Exposed: No Fat Layer (Subcutaneous Tissue) Exposed: No Tendon Exposed: No Muscle Exposed: No Joint Exposed: No Bone Exposed: No uction  in Area: 100% uction in Volume: 100% elialization: Large (67-100%) eling: No rmining: No Electronic Signature(s) Signed: 02/09/2019 4:13:56 PM By: Mikeal Hawthorne EMT/HBOT Signed: 02/09/2019 5:16:49 PM By: Kela Millin Entered By: Mikeal Hawthorne on 02/09/2019 09:21:58 -------------------------------------------------------------------------------- Wound Assessment Details Patient Name: Date of Service: [name] [name], [name] [name] 02/07/2019 8:15 AM Medical Record [id number] Patient Account Number: [id number] Date of Birth/Sex: Treating RN: [birth year] (73 y.o. Clearnce Sorrel Primary Care Airika Alkhatib: [name] Other Clinician: Referring Carder Yin: Treating Johana Hopkinson/Extender:Robson, Tenna Child, [place] Siren in Treatment: 4 Wound Status Wound Number: 31 Primary Lymphedema Etiology: Wound Location: Right Lower Leg - Posterior, Distal Secondary Diabetic Wound/Ulcer of the Lower Extremity Wounding Event: Gradually Appeared Wounding Event: Gradually Appeared Etiology: Date Acquired: 08/05/2018 Wound Open Weeks Of Treatment: 4 Status: Clustered Wound: No Comorbid Cataracts, Lymphedema, Arrhythmia, History: Congestive Heart Failure, Hypertension, Peripheral Venous Disease, Type II Diabetes, Osteoarthritis, Neuropathy, Confinement Anxiety Photos Wound Measurements Length: (cm) 2 % Reduct Width: (cm) 3.1 % Reduct Depth: (cm) 0.1 Epitheli Area: (cm) 4.869 Tunneli Volume: (cm) 0.487 Undermi Wound Description Classification: Full Thickness Without Exposed Support Foul Odo Structures Slough/F Wound Flat and Intact Margin: Exudate Medium Amount: Exudate Purulent Type: Exudate yellow, brown, green Color: Wound Bed Granulation Amount: Large (67-100%) Granulation Quality: Red Fascia E Necrotic Amount: Small (1-33%) Fat Laye Necrotic Quality: Adherent Slough Tendon E Muscle E Joint Ex Bone Exp r After Cleansing: No ibrino Yes Exposed Structure xposed:  No r (Subcutaneous Tissue) Exposed: Yes xposed: No xposed: No posed: No osed: No ion in Area: -376.9% ion in Volume: -377.5% alization: Small (1-33%) ng: No ning: No Electronic Signature(s) Signed: 02/09/2019 4:13:56 PM By: Mikeal Hawthorne EMT/HBOT Signed: 02/09/2019 5:16:49 PM By: Kela Millin Entered By: Mikeal Hawthorne on 02/09/2019 09:36:14 -------------------------------------------------------------------------------- Wound Assessment Details Patient Name: Date of Service: [name] [name], [name] [name] 02/07/2019 8:15 AM Medical Record [id number] Patient Account Number: [id number] Date of Birth/Sex: Treating RN: [birth year] (73 y.o. Clearnce Sorrel Primary Care Breck Hollinger: [name] Other Clinician: Referring Sister Carbone: Treating Perla Echavarria/Extender:Robson, Tenna Child, [place] Siren in Treatment: 4 Wound Status Wound Number: 32 Primary Lymphedema Etiology: Wound Location: Left Lower Leg - Anterior Secondary Diabetic Wound/Ulcer of the Lower Extremity Wounding Event: Gradually Appeared Etiology: Date Acquired: 08/05/2018 Wound Open Weeks Of Treatment: 4 Status: Clustered Wound: No Comorbid Cataracts, Lymphedema, Arrhythmia, History: Congestive Heart Failure, Hypertension, Peripheral Venous Disease, Type II Diabetes, Osteoarthritis, Neuropathy, Confinement Anxiety  Photos Wound Measurements Length: (cm) 0.6 % Reduct Width: (cm) 0.3 % Reduct Depth: (cm) 0.1 Epitheli Area: (cm) 0.141 Tunneli Volume: (cm) 0.014 Undermi Wound Description Classification: Full Thickness Without Exposed Support Foul Od Structures Slough/ Wound Flat and Intact Margin: Exudate Medium Amount: Exudate Purulent Type: Exudate yellow, brown, green Color: Wound Bed Granulation Amount: Medium (34-66%) Necrotic Amount: Medium (34-66%) Fascia Necrotic Quality: Adherent Slough Fat Lay Tendon Muscle Joint E Bone Ex or After Cleansing: No Fibrino Yes Exposed  Structure Exposed: No er (Subcutaneous Tissue) Exposed: Yes Exposed: No Exposed: No xposed: No posed: No ion in Area: 89.1% ion in Volume: 89.2% alization: Large (67-100%) ng: No ning: No Electronic Signature(s) Signed: 02/09/2019 4:13:56 PM By: Mikeal Hawthorne EMT/HBOT Signed: 02/09/2019 5:16:49 PM By: Kela Millin Entered By: Mikeal Hawthorne on 02/09/2019 09:19:58 -------------------------------------------------------------------------------- Wound Assessment Details Patient Name: Date of Service: [name] [name], Jamiee 02/07/2019 8:15 AM Medical Record [id number] Patient Account Number: [id number] Date of Birth/Sex: Treating RN: 10-Dec-1945 (73 y.o. 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 Event: Gradually Appeared Etiology: Date Acquired: 08/05/2018 Wound Open Weeks Of Treatment: 4 Status: Clustered Wound: Yes Comorbid Cataracts, Lymphedema, Arrhythmia, History: Congestive Heart Failure, Hypertension, Peripheral Venous Disease, Type II Diabetes, Osteoarthritis, Neuropathy, Confinement Anxiety Photos Wound Measurements Length: (cm) 3 % Reduct Width: (cm) 9.5 % Reduct Depth: (cm) 0.1 Epitheli Clustered Quantity: 2 Tunnelin Area: (cm) 22.384 Undermi Volume: (cm) 2.238 Wound Description Full Thickness Without Exposed Support Foul Od Classification: Structures Slough/ Wound Flat and Intact Margin: Exudate Medium Amount: Exudate Purulent Type: Exudate yellow, brown, green Color: Wound Bed Granulation Amount: Medium (34-66%) Granulation Quality: Red, Pink Fascia Necrotic Amount: Medium (34-66%) Fat Lay Necrotic Quality: Adherent Slough  Tendon Muscle Joint E Bone Ex or After Cleansing: No Fibrino Yes Exposed Structure Exposed: No er (Subcutaneous Tissue) Exposed: Yes Exposed: No Exposed: No xposed: No posed: No ion in Area: 35.2% ion in Volume: 35.2% alization: Small (1-33%) g: No ning: No Electronic Signature(s) Signed: 02/09/2019 4:13:56 PM By: Mikeal Hawthorne EMT/HBOT Signed: 02/09/2019 5:16:49 PM By: Kela Millin Entered By: Mikeal Hawthorne on 02/09/2019 09:21:09 -------------------------------------------------------------------------------- Wound Assessment Details Patient Name: Date of Service: Vane, Samyia 02/07/2019 8:15 AM Medical Record [id number] Patient Account Number: [id number] Date of Birth/Sex: Treating RN: [birth year] (73 y.o. Clearnce Sorrel Primary Care Ladana Chavero: [name] Other Clinician: Referring Jeanice Dempsey: Treating Terion Hedman/Extender:Robson, Tenna Child, [place] Siren in Treatment: 4 Wound Status Wound Number: 34 Primary Lymphedema Etiology: Wound Location: Left Lower Leg - Lateral, Distal Secondary Diabetic Wound/Ulcer of the Lower Extremity Wounding Event: Gradually Appeared Etiology: Date Acquired: 01/10/2019 Wound Open Weeks Of Treatment: 4 Status: Clustered Wound: No Comorbid Cataracts, Lymphedema, Arrhythmia, History: Congestive Heart Failure, Hypertension, Peripheral Venous Disease, Type II Diabetes, Osteoarthritis, Neuropathy, Confinement Anxiety Photos Wound Measurements Length: (cm) 1.5 % Reduct Width: (cm) 1.6 % Reduct Depth: (cm) 0.1 Epitheli Area: (cm) 1.885 Tunneli Volume: (cm) 0.188 Undermi Wound Description Full Thickness Without Exposed Support Foul Od Classification: Structures Slough/ Wound Flat and Intact Margin: Exudate Medium Amount: Exudate Purulent Type: Exudate yellow, brown, green Color: Wound Bed Granulation Amount: Medium (34-66%) Granulation Quality: Red Fascia Necrotic Amount: Medium (34-66%)  Fat Lay Necrotic Quality: Adherent Slough Tendon Muscle Joint E Bone Ex or After Cleansing: No Fibrino Yes Exposed Structure Exposed: No er (Subcutaneous Tissue) Exposed: Yes  Exposed: No Exposed: No xposed: No posed: No ion in Area: 29.8% ion in Volume: 30.1% alization: Small (1-33%) ng: No ning: No Electronic Signature(s) Signed: 02/09/2019 4:13:56 PM By: Mikeal Hawthorne EMT/HBOT Signed: 02/09/2019 5:16:49 PM By: Kela Millin Entered By: Mikeal Hawthorne on 02/09/2019 09:20:49 -------------------------------------------------------------------------------- Wound Assessment Details Patient Name: Date of Service: Ruland, Alexza 02/07/2019 8:15 AM Medical Record [id number] Patient Account Number: [id number] Date of Birth/Sex: Treating RN: [birth year] (73 y.o. Clearnce Sorrel Primary Care Anairis Knick: [name] Other Clinician: Referring Kadie Balestrieri: Treating Laina Guerrieri/Extender:Robson, Tenna Child, [place] Siren in Treatment: 4 Wound Status Wound Number: 35 Primary Lymphedema Etiology: Wound Location: Left Lower Leg - Anterior, Distal Wound Healed - Epithelialized Wounding Event: Gradually Appeared Status: Date Acquired: 01/24/2019 Comorbid Cataracts, Lymphedema, Arrhythmia, Weeks Of Treatment: 2 History: Congestive Heart Failure, Hypertension, Clustered Wound: No Peripheral Venous Disease, Type II Diabetes, Osteoarthritis, Neuropathy, Confinement Anxiety Photos Wound Measurements Length: (cm) 0 % Reduct Width: (cm) 0 % Reduct Depth: (cm) 0 Epitheli Area: (cm) 0 Tunneli Volume: (cm) 0 Undermi Wound Description Full Thickness Without Exposed Support Foul Odo Classification: Structures Slough/F Wound Distinct, outline attached Margin: Exudate None Present Amount: Wound Bed Granulation Amount: None Present (0%) Necrotic Amount: None Present (0%) Fascia E Fat Laye Tendon E Muscle E Joint Ex Bone Exp r After Cleansing:  No ibrino No Exposed Structure xposed: No r (Subcutaneous Tissue) Exposed: No xposed: No xposed: No posed: No osed: No ion in Area: 100% ion in Volume: 100% alization: Large (67-100%) ng: No ning: No Electronic Signature(s) Signed: 02/09/2019 4:13:56 PM By: Mikeal Hawthorne EMT/HBOT Signed: 02/09/2019 5:16:49 PM By: Kela Millin Entered By: Mikeal Hawthorne on 02/09/2019 09:20:26 -------------------------------------------------------------------------------- Vitals Details Patient Name: Date of Service: Sportsman, Donnelle 02/07/2019 8:15 AM Medical Record [id number] Patient Account Number: [id number] Date of Birth/Sex: Treating RN: 20-Jun-1945 (73 y.o. Clearnce Sorrel Primary Care Natsuko Kelsay: [name] Other Clinician: Referring Adien Kimmel: Treating Taray Normoyle/Extender:Robson, Tenna Child, [place] Siren in Treatment: 4 Vital Signs Time Taken: 08:09 Temperature (F): 97.9 Height (in): 71 Pulse (bpm): 76 Weight (lbs): 240 Respiratory Rate (breaths/min): 18 Body Mass Index (BMI): 33.5 Blood Pressure (mmHg): 142/87 Reference Range: 80 - 120 mg / dl Electronic Signature(s) Signed: 02/09/2019 5:16:49 PM By: Kela Millin Entered By: Kela Millin on 02/07/2019 08:11:14

## 2019-03-16 ENCOUNTER — Other Ambulatory Visit: Payer: Self-pay

## 2019-03-16 ENCOUNTER — Encounter (HOSPITAL_BASED_OUTPATIENT_CLINIC_OR_DEPARTMENT_OTHER): Payer: PPO | Admitting: Internal Medicine

## 2019-03-16 ENCOUNTER — Ambulatory Visit: Payer: PPO

## 2019-03-16 DIAGNOSIS — E11622 Type 2 diabetes mellitus with other skin ulcer: Secondary | ICD-10-CM | POA: Diagnosis not present

## 2019-03-16 DIAGNOSIS — I890 Lymphedema, not elsewhere classified: Secondary | ICD-10-CM

## 2019-03-16 DIAGNOSIS — R2689 Other abnormalities of gait and mobility: Secondary | ICD-10-CM

## 2019-03-16 NOTE — Therapy (Signed)
[place], [place], [address] Phone: [phone]   Fax:  [phone]  Physical Therapy Treatment  Patient Details  Name: [name] [name] MRN: [id number] Date of Birth: [birth year] Referring Provider (PT): Darci Current   Encounter Date: 03/16/2019  PT End of Session - 03/16/19 1526    Visit Number  6    Number of Visits  25    Date for PT Re-Evaluation  03/28/19    PT Start Time  [id number]    PT Stop Time  1500    PT Time Calculation (min)  55 min    Activity Tolerance  Patient tolerated treatment well    Behavior During Therapy  [facility] for tasks assessed/performed       Past Medical History:  Diagnosis Date  . Acute kidney injury ([place])   . Aortic stenosis, mild 11/17/2013  . Arthritis    "both knees, spine, back, fingers" (11/29/2013)  . CHF (congestive heart failure) ([place])   . Edema   . GERD (gastroesophageal reflux disease)   . Heart murmur   . History of diastolic dysfunction    Echo [id number] with diastolic dysfunction  . HTN (hypertension)   . Morbid obesity ([place])   . OA (osteoarthritis)   . PAC (premature atrial contraction)    per prior Holter  . Palpitations   . Pneumonia    "as a child"  . PVC's (premature ventricular contractions)    per prior Holter  . SVT (supraventricular tachycardia) ([place])   . Tachycardia    noted at 07/28/11 visit. Started on beta blocker. Possible atrial flutter vs long PR tachycardia/AVNRT  . Type II diabetes mellitus ([place])     Past Surgical History:  Procedure Laterality Date  . CESAREAN SECTION  1985  . CESAREAN SECTION  1985  . SUPRAVENTRICULAR TACHYCARDIA ABLATION  11/29/2013  . SUPRAVENTRICULAR TACHYCARDIA ABLATION N/A 11/29/2013   Procedure: SUPRAVENTRICULAR TACHYCARDIA ABLATION;  Surgeon: Evans Lance, MD;  Location[facility] CATH LAB;  Service: Cardiovascular;  Laterality: N/A;  . US ECHOCARDIOGRAPHY  07/25/2008   EF 55-60%  . VAGINAL DELIVERY       There were no vitals filed for this visit.  Subjective Assessment - 03/16/19 1516    Subjective  Pt states that her bandages did not bother her but she has not worn her compression due to she feels that she is on able to don them quickly. She has not worn her tubigrip that was provied for light thigh compression if she was unable to wear the compression pants due to she stated she thought she was suppose to clean it and bring it back.    Pertinent History  osteo arthritis, hx cellulitis, phlebitis and history of edema in BLE, Hx Acute kidney injury, CHF and DM II    Patient Stated Goals  I want to get this swelling down and heal my wound.    Currently in Pain?  Yes    Pain Score  7     Pain Location  Knee    Pain Orientation  Right;Left    Pain Descriptors / Indicators  Aching    Pain Type  Chronic pain    Pain Onset  More than a month ago    Pain Frequency  Intermittent    Aggravating Factors   movement    Pain Relieving Factors  rest  [facility] Adult PT Treatment/Exercise - 03/16/19 0001      Manual Therapy   Manual Therapy  Edema management;Compression Bandaging    Manual therapy comments  Pt LLE was washed prior to beginning her physical therapy with soap/warm water after bandages were removed. Antiseptic spray was used to remove 1x hydrofera blue due to small amount of sanguinous drainage over 2 areas on her anteror/lateral lower leg. Hydro fera blue was placed back over these two areas covered with an ABD pad and then kerlex was used to keep in place prior to bandaging.     Edema Management  Discussed indepth the importance of wearing compression on her lower leg and knee and to fill out her paper work to get vasopneumatic pump due to continued edema in her Bil LE. Pt was assisted with donning tubigrip over her compression wrap for her thigh and knee.     Compression Bandaging  Pt was wrapped with 3 artiflex for shaping and 1 8 cm roman sandal, 1  10 cm for ankle sole heel and 2 cm spiraling up the lower leg and just above the knee. 1 size tubigrip was used for the lower leg and a larger tubigrip was provided for the knee and thigh. Smaller tubigrip was placed as a base layer for compression wrap with tricofix placed on top to keep in place. toes were wrapped with 2 inch elastomull folded for digits 1-5.              PT Education - 03/16/19 1523    Education Details  Pt will keep the tubigrip over her L knee and thigh as much as she possibly can in order to facilitate fluid flow out of her LLE to prevent champagne glass deformity in her L lower leg. Re-oterated education on when to remove the wraps including pain, tingling, numbness/throbbing that is not relieved with movement or removing the top wrap that was placed over the thigh and knee. Also, discussed filling out the paper work for the correct vasopneumatic pump due to this will help pt significantly. Currently her pump is not correct and at the wrong level in relationship to her edema level.    Person(s) Educated  Patient    Methods  Explanation    Comprehension  Verbalized understanding       PT Short Term Goals - 02/21/19 1654      PT SHORT TERM GOAL #1   Title  Pt will be independent with HEP    Baseline  Pt does not have an HEP    Time  6    Period  Weeks    Status  New    Target Date  04/11/19        PT Long Term Goals - 02/21/19 1654      PT LONG TERM GOAL #1   Title  Patient will have reduction of L lower leg limb girth by 3 cm    Baseline  See measurements    Time  12    Period  Weeks    Status  New    Target Date  05/23/19      PT LONG TERM GOAL #2   Title  Patient to be properly fitted with compression garment to wear on daily basis.    Baseline  Pt is unable to fit her compression garments and has no compression for the thighs.    Time  12    Period  Weeks    Status  New  Target Date  05/23/19      PT LONG TERM GOAL #3   Title  Patient will  be independent in self-care management principles including self-massage/bandaging and long term management plan for edema.    Baseline  Pt has unmanaged lymphedema that results in chronic wound    Time  12    Period  Weeks    Status  New    Target Date  05/23/19            Plan - 03/16/19 1526    Clinical Impression Statement  Pt presented today with her wrap still on from her last session. Extra education and time spent on informing patient if she has to miss an appointment that she must remove her wraps after 2-3 days due to possible constrictures or skin issues and if she does have to remove her wraps to keep her leg elevated and use her pump. Pt has a pump that is at the wrong level in regards to her edema and was encouraged to fill out the necessary information to get the correct pump which she states she has not yet done. Extra time spent on educating pt on the importance of compression at her knee and thigh due to pt presented with no compression in this area and champagne glass formation in the L lower leg. Pt was washed and wound care was performed prior to compression bandaging. Pt had very little sanguinous drainage this session. She was wrapped over the L lower leg and very lightly over the L knee in order to improve fluid flow with the ability to remove the top wrap that is over her L knee/thigh if she finds it too difficult to move her L knee. Pt was assisted placing tubigrip over this wrap to help keep it in place and educated that if she does need to remove the top bandage to keep the tubigrip over her thigh and knee. Pt will benefit from continued POC at this time.    Personal Factors and Comorbidities  Age;Comorbidity 3+    Comorbidities  CHF, acute kidney injury, DM II    PT Frequency  2x / week    PT Duration  12 weeks    PT Treatment/Interventions  Electrical Stimulation;Cryotherapy;Gait training;Stair training;Functional mobility training;Therapeutic activities;Therapeutic  exercise;Balance training;Neuromuscular re-education;Patient/family education;Manual lymph drainage;Compression bandaging;Passive range of motion;Taping;Vasopneumatic Device    PT Next Visit Plan  Assess wrap, give exercises, assess self MLD and possible work out pants?    PT Home Exercise Plan  ask about pump, provide exercises    Consulted and Agree with Plan of Care  Patient       Patient will benefit from skilled therapeutic intervention in order to improve the following deficits and impairments:  Difficulty walking, Increased edema  Visit Diagnosis: Lymphedema  Other abnormalities of gait and mobility     Problem List Patient Active Problem List   Diagnosis Date Noted  . Postnasal drip 11/16/2017  . Sensorineural hearing loss (SNHL), bilateral 11/16/2017  . Tinnitus of both ears 11/16/2017  . Obesity, Class II, BMI 35-39.9, isolated (see actual BMI) 11/08/2017  . CKD (chronic kidney disease), stage III 11/08/2017  . Anemia 11/07/2017  . Thrombocytosis ([place]) 11/07/2017  . Lymphedema 11/07/2017  . AKI (acute kidney injury) ([place]) 10/16/2017  . Routine general medical examination at a health care facility 05/10/2014  . Paroxysmal SVT (supraventricular tachycardia) ([place]) 11/28/2013  . Physical deconditioning 11/23/2013  . Generalized weakness 11/17/2013  . Nausea 11/17/2013  . Multilevel  spine pain 11/17/2013  . Aortic stenosis, mild 11/17/2013  . Acute lower UTI 11/17/2013  . Cellulitis of left lower extremity 11/17/2013  . DM type 2 (diabetes mellitus, type 2) ([place]) 02/02/2013  . Arthritis 10/19/2012  . Mixed incontinence 10/19/2012  . GERD (gastroesophageal reflux disease) 04/20/2011  . HTN (hypertension) 01/20/2011  . Morbid obesity ([place])     Ander Purpura, PT 03/16/2019, 3:32 PM  [place], [place], [address] Phone: [phone]   Fax:  [phone]  Name: [name] [name] MRN:  [id number] Date of Birth: 10-Oct-1945

## 2019-03-20 ENCOUNTER — Ambulatory Visit (INDEPENDENT_AMBULATORY_CARE_PROVIDER_SITE_OTHER): Payer: PPO | Admitting: Internal Medicine

## 2019-03-20 ENCOUNTER — Other Ambulatory Visit: Payer: Self-pay

## 2019-03-20 ENCOUNTER — Other Ambulatory Visit (INDEPENDENT_AMBULATORY_CARE_PROVIDER_SITE_OTHER): Payer: PPO

## 2019-03-20 ENCOUNTER — Encounter: Payer: Self-pay | Admitting: Internal Medicine

## 2019-03-20 VITALS — BP 140/80 | HR 64 | Temp 98.6°F | Ht 71.0 in | Wt 280.0 lb

## 2019-03-20 DIAGNOSIS — Z0000 Encounter for general adult medical examination without abnormal findings: Secondary | ICD-10-CM

## 2019-03-20 DIAGNOSIS — I890 Lymphedema, not elsewhere classified: Secondary | ICD-10-CM | POA: Diagnosis not present

## 2019-03-20 DIAGNOSIS — E1169 Type 2 diabetes mellitus with other specified complication: Secondary | ICD-10-CM

## 2019-03-20 DIAGNOSIS — R5381 Other malaise: Secondary | ICD-10-CM

## 2019-03-20 LAB — CBC
HCT: 30.6 % — ABNORMAL LOW (ref 36.0–46.0)
Hemoglobin: 9.6 g/dL — ABNORMAL LOW (ref 12.0–15.0)
MCHC: 31.4 g/dL (ref 30.0–36.0)
MCV: 75.5 fl — ABNORMAL LOW (ref 78.0–100.0)
Platelets: 339 K/uL (ref 150.0–400.0)
RBC: 4.05 Mil/uL (ref 3.87–5.11)
RDW: 16.9 % — ABNORMAL HIGH (ref 11.5–15.5)
WBC: 4.8 K/uL (ref 4.0–10.5)

## 2019-03-20 LAB — COMPREHENSIVE METABOLIC PANEL WITH GFR
ALT: 9 U/L (ref 0–35)
AST: 18 U/L (ref 0–37)
Albumin: 4.1 g/dL (ref 3.5–5.2)
Alkaline Phosphatase: 62 U/L (ref 39–117)
BUN: 20 mg/dL (ref 6–23)
CO2: 25 meq/L (ref 19–32)
Calcium: 9.7 mg/dL (ref 8.4–10.5)
Chloride: 105 meq/L (ref 96–112)
Creatinine, Ser: 0.97 mg/dL (ref 0.40–1.20)
GFR: 68.22 mL/min
Glucose, Bld: 91 mg/dL (ref 70–99)
Potassium: 4.1 meq/L (ref 3.5–5.1)
Sodium: 139 meq/L (ref 135–145)
Total Bilirubin: 0.3 mg/dL (ref 0.2–1.2)
Total Protein: 8.5 g/dL — ABNORMAL HIGH (ref 6.0–8.3)

## 2019-03-20 LAB — LIPID PANEL
Cholesterol: 178 mg/dL (ref 0–200)
HDL: 74.5 mg/dL (ref >39.00)
LDL Cholesterol: 92 mg/dL (ref 0–99)
NonHDL: 103.65
Total CHOL/HDL Ratio: 2
Triglycerides: 58 mg/dL (ref 0.0–149.0)
VLDL: 11.6 mg/dL (ref 0.0–40.0)

## 2019-03-20 NOTE — Patient Instructions (Signed)
Health Maintenance, Female Adopting a healthy lifestyle and getting preventive care are important in promoting health and wellness. Ask your health care provider about:  The right schedule for you to have regular tests and exams.  Things you can do on your own to prevent diseases and keep yourself healthy. What should I know about diet, weight, and exercise? Eat a healthy diet   Eat a diet that includes plenty of vegetables, fruits, low-fat dairy products, and lean protein.  Do not eat a lot of foods that are high in solid fats, added sugars, or sodium. Maintain a healthy weight Body mass index (BMI) is used to identify weight problems. It estimates body fat based on height and weight. Your health care provider can help determine your BMI and help you achieve or maintain a healthy weight. Get regular exercise Get regular exercise. This is one of the most important things you can do for your health. Most adults should:  Exercise for at least 150 minutes each week. The exercise should increase your heart rate and make you sweat (moderate-intensity exercise).  Do strengthening exercises at least twice a week. This is in addition to the moderate-intensity exercise.  Spend less time sitting. Even light physical activity can be beneficial. Watch cholesterol and blood lipids Have your blood tested for lipids and cholesterol at 72 years of age, then have this test every 5 years. Have your cholesterol levels checked more often if:  Your lipid or cholesterol levels are high.  You are older than 72 years of age.  You are at high risk for heart disease. What should I know about cancer screening? Depending on your health history and family history, you may need to have cancer screening at various ages. This may include screening for:  Breast cancer.  Cervical cancer.  Colorectal cancer.  Skin cancer.  Lung cancer. What should I know about heart disease, diabetes, and high blood  pressure? Blood pressure and heart disease  High blood pressure causes heart disease and increases the risk of stroke. This is more likely to develop in people who have high blood pressure readings, are of African descent, or are overweight.  Have your blood pressure checked: ? Every 3-5 years if you are 18-39 years of age. ? Every year if you are 40 years old or older. Diabetes Have regular diabetes screenings. This checks your fasting blood sugar level. Have the screening done:  Once every three years after age 40 if you are at a normal weight and have a low risk for diabetes.  More often and at a younger age if you are overweight or have a high risk for diabetes. What should I know about preventing infection? Hepatitis B If you have a higher risk for hepatitis B, you should be screened for this virus. Talk with your health care provider to find out if you are at risk for hepatitis B infection. Hepatitis C Testing is recommended for:  Everyone born from 1945 through 1965.  Anyone with known risk factors for hepatitis C. Sexually transmitted infections (STIs)  Get screened for STIs, including gonorrhea and chlamydia, if: ? You are sexually active and are younger than 72 years of age. ? You are older than 72 years of age and your health care provider tells you that you are at risk for this type of infection. ? Your sexual activity has changed since you were last screened, and you are at increased risk for chlamydia or gonorrhea. Ask your health care provider if   you are at risk.  Ask your health care provider about whether you are at high risk for HIV. Your health care provider may recommend a prescription medicine to help prevent HIV infection. If you choose to take medicine to prevent HIV, you should first get tested for HIV. You should then be tested every 3 months for as long as you are taking the medicine. Pregnancy  If you are about to stop having your period (premenopausal) and  you may become pregnant, seek counseling before you get pregnant.  Take 400 to 800 micrograms (mcg) of folic acid every day if you become pregnant.  Ask for birth control (contraception) if you want to prevent pregnancy. Osteoporosis and menopause Osteoporosis is a disease in which the bones lose minerals and strength with aging. This can result in bone fractures. If you are 65 years old or older, or if you are at risk for osteoporosis and fractures, ask your health care provider if you should:  Be screened for bone loss.  Take a calcium or vitamin D supplement to lower your risk of fractures.  Be given hormone replacement therapy (HRT) to treat symptoms of menopause. Follow these instructions at home: Lifestyle  Do not use any products that contain nicotine or tobacco, such as cigarettes, e-cigarettes, and chewing tobacco. If you need help quitting, ask your health care provider.  Do not use street drugs.  Do not share needles.  Ask your health care provider for help if you need support or information about quitting drugs. Alcohol use  Do not drink alcohol if: ? Your health care provider tells you not to drink. ? You are pregnant, may be pregnant, or are planning to become pregnant.  If you drink alcohol: ? Limit how much you use to 0-1 drink a day. ? Limit intake if you are breastfeeding.  Be aware of how much alcohol is in your drink. In the [place] one drink equals one 12 oz bottle of beer (355 mL), one 5 oz glass of wine (148 mL), or one 1 oz glass of hard liquor (44 mL). General instructions  Schedule regular health, dental, and eye exams.  Stay current with your vaccines.  Tell your health care provider if: ? You often feel depressed. ? You have ever been abused or do not feel safe at home. Summary  Adopting a healthy lifestyle and getting preventive care are important in promoting health and wellness.  Follow your health care provider's instructions about healthy  diet, exercising, and getting tested or screened for diseases.  Follow your health care provider's instructions on monitoring your cholesterol and blood pressure. This information is not intended to replace advice given to you by your health care provider. Make sure you discuss any questions you have with your health care provider. Document Released: 10/06/2010 Document Revised: 03/16/2018 Document Reviewed: 03/16/2018 Elsevier Patient Education  2020 [employer]  

## 2019-03-20 NOTE — Progress Notes (Signed)
[name] [name], [name] [name] ([id number]) Visit Report for 03/16/2019 Arrival Information Details Patient Name: Date of Service: [name] [name], [name] [name] 03/16/2019 3:45 PM Medical Record [id number] Patient Account Number: [id number] Date of Birth/Sex: Treating RN: 08-May-1945 (73 y.o. Clearnce Sorrel Primary Care Dhillon Comunale: [name] Other Clinician: Referring Keyleigh Manninen: Treating Lima Chillemi/Extender:Robson, Tenna Child, [place] Siren in Treatment: 9 Visit Information History Since Last Visit Walker Added or deleted any medications: No Patient Arrived: Any new allergies or adverse reactions: No Arrival Time: 15:52 Had a fall or experienced change in No Accompanied By: self activities of daily living that may affect Transfer Assistance: None risk of falls: Patient Identification Verified: Yes Signs or symptoms of abuse/neglect since last No Secondary Verification Process Completed: Yes visito Patient Requires Transmission-Based No Hospitalized since last visit: No Precautions: Implantable device outside of the clinic excluding No Patient Has Alerts: No cellular tissue based products placed in the center since last visit: Has Dressing in Place as Prescribed: Yes Has Compression in Place as Prescribed: Yes Pain Present Now: No Electronic Signature(s) Signed: 03/20/2019 5:56:42 PM By: Kela Millin Entered By: Kela Millin on 03/16/2019 15:52:49 -------------------------------------------------------------------------------- Compression Therapy Details Patient Name: Date of Service: [name] [name], [name] [name] 03/16/2019 3:45 PM Medical Record [id number] Patient Account Number: [id number] Date of Birth/Sex: Treating RN: 10/21/1945 (73 y.o. Clearnce Sorrel Primary Care Azan Maneri: [name] Other Clinician: Referring Jessamine Barcia: Treating Darwyn Ponzo/Extender:Robson, Tenna Child, [place] Siren in Treatment: 9 Compression Therapy Performed for Wound  Wound #28 Right,Lateral Lower Leg Assessment: Performed By: Clinician Kela Millin, RN Compression Type: Four Layer Electronic Signature(s) Signed: 03/20/2019 5:56:42 PM By: Kela Millin Entered By: Kela Millin on 03/16/2019 15:55:19 -------------------------------------------------------------------------------- Compression Therapy Details Patient Name: Date of Service: [name] [name], [name] [name] 03/16/2019 3:45 PM Medical Record [id number] Patient Account Number: [id number] Date of Birth/Sex: Treating RN: May 11, 1945 (73 y.o. Clearnce Sorrel Primary Care Marjory Meints: [name] Other Clinician: Referring Sri Clegg: Treating Virgilia Quigg/Extender:Robson, Tenna Child, [place] Siren in Treatment: 9 Compression Therapy Performed for Wound Wound #31 Right,Distal,Posterior Lower Leg Assessment: Performed By: Clinician Kela Millin, RN Compression Type: Four Layer Electronic Signature(s) Signed: 03/20/2019 5:56:42 PM By: Kela Millin Entered By: Kela Millin on 03/16/2019 15:55:19 -------------------------------------------------------------------------------- Encounter Discharge Information Details Patient Name: Date of Service: [name] [name], [name] [name] 03/16/2019 3:45 PM Medical Record [id number] Patient Account Number: [id number] Date of Birth/Sex: Treating RN: [birth year] (73 y.o. Clearnce Sorrel Primary Care Shereen Marton: [name] Other Clinician: Referring Adyen Bifulco: Treating Hussien Greenblatt/Extender:Robson, Tenna Child, [place] Siren in Treatment: 9 Encounter Discharge Information Items Discharge Condition: Stable Ambulatory Status: Walker Discharge Destination: Home Transportation: Other Accompanied By: self Schedule Follow-up Appointment: Yes Clinical Summary of Care: Patient Declined Electronic Signature(s) Signed: 03/20/2019 5:56:42 PM By: Kela Millin Entered By: Kela Millin on 03/16/2019  15:57:09 -------------------------------------------------------------------------------- Patient/Caregiver Education Details Patient Name: Date of Service: [name] [name], [name] [name] 12/10/2020andnbsp3:45 PM Medical Record [id number] Patient Account Number: [id number] Date of Birth/Gender: Treating RN: 07/12/1945 (74 y.o. Clearnce Sorrel Primary Care Physician: [name] Other Clinician: Referring Physician: Treating Physician/Extender:Robson, Tenna Child, [place] Siren in Treatment: 9 Education Assessment Education Provided To: Patient Education Topics Provided Wound/Skin Impairment: Methods: Explain/Verbal Responses: State content correctly Electronic Signature(s) Signed: 03/20/2019 5:56:42 PM By: Kela Millin Entered By: Kela Millin on 03/16/2019 15:56:52 -------------------------------------------------------------------------------- Wound Assessment Details Patient Name: Date of Service: [name] [name], [name] [name] 03/16/2019 3:45 PM Medical Record [id number] Patient Account Number: [id number] Date of Birth/Sex: Treating RN: 02-06-1946 (73 y.o. Clearnce Sorrel Primary Care Cloyd Ragas: [name] Other Clinician: Referring Kaela Beitz: Treating Evangelene Vora/Extender:Robson, Tenna Child, [place] Siren in Treatment: 9 Wound Status Wound Number: 28 Primary Lymphedema  Etiology: Wound Location: Right Lower Leg - Lateral Secondary Diabetic Wound/Ulcer of the Lower Wounding Event: Gradually Appeared Etiology: Extremity Date Acquired: 08/05/2018 Wound Open Weeks Of Treatment: 9 Status: Clustered Wound: No Comorbid Cataracts, Lymphedema, Arrhythmia, History: Congestive Heart Failure, Hypertension, Peripheral Venous Disease, Type II Diabetes, Osteoarthritis, Neuropathy, Confinement Anxiety Wound Measurements Length: (cm) 2.3 % Reduct Width: (cm) 3.3 % Reduct Depth: (cm) 0.1 Epitheli Area: (cm) 5.961 Tunneli Volume: (cm) 0.596  Undermi Wound Description Full Thickness Without Exposed Support Foul Od Classification: Structures Slough/ Wound Indistinct, nonvisible Margin: Exudate Small Amount: Exudate Serosanguineous Type: Exudate red, brown Color: Wound Bed Granulation Amount: Large (67-100%) Granulation Quality: Red Fascia E Necrotic Amount: None Present (0%) Fat Laye Tendon E Muscle E Joint Ex Bone Exp or After Cleansing: No Fibrino No Exposed Structure xposed: No r (Subcutaneous Tissue) Exposed: Yes xposed: No xposed: No posed: No osed: No ion in Area: 83.3% ion in Volume: 83.3% alization: Medium (34-66%) ng: No ning: No Treatment Notes Wound #28 (Right, Lateral Lower Leg) 1. Cleanse With Wound Cleanser Soap and water 2. Periwound Care TCA Cream 3. Primary Dressing Applied Hydrofera Blue 4. Secondary Dressing ABD Pad Kerramax/Xtrasorb 6. Support Layer Applied 4 layer compression wrap Notes lymphedema specialist wrapping left leg and right wrapped by team Electronic Signature(s) Signed: 03/20/2019 5:56:42 PM By: Kela Millin Entered By: Kela Millin on 03/16/2019 15:54:06 -------------------------------------------------------------------------------- Wound Assessment Details Patient Name: Date of Service: [name] [name], [name] [name] 03/16/2019 3:45 PM Medical Record [id number] Patient Account Number: [id number] Date of Birth/Sex: Treating RN: 06/15/1945 (73 y.o. Clearnce Sorrel Primary Care Shaughn Thomley: [name] Other Clinician: Referring Kinnie Kaupp: Treating Letonia Stead/Extender:Robson, Tenna Child, [place] Siren in Treatment: 9 Wound Status Wound Number: 31 Primary Lymphedema Etiology: Wound Location: Right Lower Leg - Posterior, Distal Secondary Diabetic Wound/Ulcer of the Lower Wounding Event: Gradually Appeared Etiology: Extremity Date Acquired: 08/05/2018 Wound Open Weeks Of Treatment: 9 Status: Clustered Wound: No Comorbid Cataracts,  Lymphedema, Arrhythmia, History: Congestive Heart Failure, Hypertension, Peripheral Venous Disease, Type II Diabetes, Osteoarthritis, Neuropathy, Confinement Anxiety Wound Measurements Length: (cm) 1.2 % Redu Width: (cm) 0.7 % Redu Depth: (cm) 0.1 Epithe Area: (cm) 0.66 Tunne Volume: (cm) 0.066 Under Wound Description Full Thickness Without Exposed Support Classification: Structures Wound Flat and Intact Margin: Exudate Small Amount: Exudate Serosanguineous Type: Exudate red, brown Color: Wound Bed Granulation Amount: Large (67-100%) Granulation Quality: Red Necrotic Amount: None Present (0%) Foul Odor After Cleansing: No Slough/Fibrino No Exposed Structure Fascia Exposed: No Fat Layer (Subcutaneous Tissue) Exposed: Yes Tendon Exposed: No Muscle Exposed: No Joint Exposed: No Bone Exposed: No ction in Area: 35.4% ction in Volume: 35.3% lialization: Medium (34-66%) ling: No mining: No Treatment Notes Wound #31 (Right, Distal, Posterior Lower Leg) 1. Cleanse With Wound Cleanser Soap and water 2. Periwound Care TCA Cream 3. Primary Dressing Applied Hydrofera Blue 4. Secondary Dressing ABD Pad Kerramax/Xtrasorb 6. Support Layer Applied 4 layer compression wrap Notes lymphedema specialist wrapping left leg and right wrapped by team Electronic Signature(s) Signed: 03/20/2019 5:56:42 PM By: Kela Millin Entered By: Kela Millin on 03/16/2019 15:54:24 -------------------------------------------------------------------------------- Wound Assessment Details Patient Name: Date of Service: [name] [name], [name] [name] 03/16/2019 3:45 PM Medical Record [id number] Patient Account Number: [id number] Date of Birth/Sex: Treating RN: 28-Nov-1945 (73 y.o. Clearnce Sorrel Primary Care Mattisyn Cardona: [name] Other Clinician: Referring Xaiver Roskelley: Treating Naomia Lenderman/Extender:Robson, Tenna Child, [place] Siren in Treatment: 9 Wound Status Wound  Number: 32 Primary Lymphedema Etiology: Wound Location: Left Lower Leg - Anterior Secondary Diabetic Wound/Ulcer of the Lower Wounding Event: Gradually Appeared Etiology: Extremity  Date Acquired: 08/05/2018 Wound Open Weeks Of Treatment: 9 Status: Clustered Wound: No Comorbid Cataracts, Lymphedema, Arrhythmia, History: Congestive Heart Failure, Hypertension, Peripheral Venous Disease, Type II Diabetes, Osteoarthritis, Neuropathy, Confinement Anxiety Wound Measurements Length: (cm) 0.4 % Reduc Width: (cm) 0.5 % Reduc Depth: (cm) 0.1 Epithel Area: (cm) 0.157 Tunnel Volume: (cm) 0.016 Underm Wound Description Classification: Full Thickness Without Exposed Support Structures Wound Flat and Intact Margin: Exudate Small Amount: Exudate Serosanguineous Type: Exudate red, brown Color: Wound Bed Granulation Amount: Small (1-33%) Granulation Quality: Pink Necrotic Amount: Large (67-100%) Necrotic Quality: Adherent Slough Foul Odor After Cleansing: No Slough/Fibrino Yes Exposed Structure Fascia Exposed: No Fat Layer (Subcutaneous Tissue) Exposed: Yes Tendon Exposed: No Muscle Exposed: No Joint Exposed: No Bone Exposed: No tion in Area: 87.9% tion in Volume: 87.7% ialization: Small (1-33%) ing: No ining: No Treatment Notes Wound #32 (Left, Anterior Lower Leg) 1. Cleanse With Wound Cleanser Soap and water 2. Periwound Care TCA Cream 3. Primary Dressing Applied Hydrofera Blue 4. Secondary Dressing ABD Pad Kerramax/Xtrasorb 6. Support Layer Applied 4 layer compression wrap Notes lymphedema specialist wrapping left leg and right wrapped by team Electronic Signature(s) Signed: 03/20/2019 5:56:42 PM By: Kela Millin Entered By: Kela Millin on 03/16/2019 15:54:38 -------------------------------------------------------------------------------- Wound Assessment Details Patient Name: Date of Service: [name] [name], [name] [name] 03/16/2019 3:45 PM Medical  Record [id number] Patient Account Number: [id number] Date of Birth/Sex: Treating RN: 17-Apr-1945 (73 y.o. Clearnce Sorrel Primary Care Olita Takeshita: [name] Other Clinician: Referring Shondell Fabel: Treating Shaunte Tuft/Extender:Robson, Tenna Child, [place] Siren in Treatment: 9 Wound Status Wound Number: 33 Primary Lymphedema Etiology: Wound Location: Left Lower Leg - Lateral, Proximal Secondary Diabetic Wound/Ulcer of the Lower Wounding Event: Gradually Appeared Etiology: Extremity Date Acquired: 08/05/2018 Wound Open Weeks Of Treatment: 9 Status: Clustered Wound: No Comorbid Cataracts, Lymphedema, Arrhythmia, History: Congestive Heart Failure, Hypertension, Peripheral Venous Disease, Type II Diabetes, Osteoarthritis, Neuropathy, Confinement Anxiety Wound Measurements Length: (cm) 1.9 % Reduc Width: (cm) 2.4 % Reduc Depth: (cm) 0.1 Epithel Area: (cm) 3.581 Tunnel Volume: (cm) 0.358 Underm Wound Description Classification: Full Thickness Without Exposed Support Structures Wound Flat and Intact Margin: Exudate Small Amount: Exudate Serosanguineous Type: Exudate red, brown Color: Wound Bed Granulation Amount: Medium (34-66%) Granulation Quality: Pink Necrotic Amount: Medium (34-66%) Necrotic Quality: Adherent Slough Exposed Structure Fascia Exposed: No Fat Layer (Subcutaneous Tissue) Exposed: Yes Tendon Exposed: No Muscle Exposed: No Joint Exposed: No Bone Exposed: No tion in Area: 89.6% tion in Volume: 89.6% ialization: Medium (34-66%) ing: No ining: No Treatment Notes Wound #33 (Left, Proximal, Lateral Lower Leg) 1. Cleanse With Wound Cleanser Soap and water 2. Periwound Care TCA Cream 3. Primary Dressing Applied Hydrofera Blue 4. Secondary Dressing ABD Pad Kerramax/Xtrasorb 6. Support Layer Applied 4 layer compression wrap Notes lymphedema specialist wrapping left leg and right wrapped by team Electronic  Signature(s) Signed: 03/20/2019 5:56:42 PM By: Kela Millin Entered By: Kela Millin on 03/16/2019 15:54:47 -------------------------------------------------------------------------------- Vitals Details Patient Name: Date of Service: Cominsky, Sherrita 03/16/2019 3:45 PM Medical Record [id number] Patient Account Number: [id number] Date of Birth/Sex: Treating RN: 1946-02-25 (73 y.o. Clearnce Sorrel Primary Care Arthuro Canelo: Other Clinician: [name] Referring Chrislynn Mosely: Treating Osha Rane/Extender:Robson, Tenna Child, [place] Siren in Treatment: 9 Vital Signs Time Taken: 15:20 Temperature (F): 98.0 Height (in): 71 Pulse (bpm): 62 Weight (lbs): 240 Respiratory Rate (breaths/min): 20 Body Mass Index (BMI): 33.5 Blood Pressure (mmHg): 141/83 Reference Range: 80 - 120 mg / dl Electronic Signature(s) Signed: 03/20/2019 5:56:42 PM By: Kela Millin Entered By: Kela Millin on 03/16/2019 15:53:15

## 2019-03-20 NOTE — Progress Notes (Signed)
NYSSA, PISCITELLI ([id number]) Visit Report for 03/16/2019 SuperBill Details Patient Name: Date of Service: KIMESHA, BIRCHETT 03/16/2019 Medical Record [id number] Patient Account Number: [id number] Date of Birth/Sex: Treating RN: 1945/06/08 (73 y.o. Clearnce Sorrel Primary Care Provider: [name] Other Clinician: Referring Provider: Treating Provider/Extender:Tacie Mccuistion, Tenna Child, [place] Siren in Treatment: 9 Diagnosis Coding ICD-10 Codes Code Description I89.0 Lymphedema, not elsewhere classified L97.221 Non-pressure chronic ulcer of left calf limited to breakdown of skin L97.211 Non-pressure chronic ulcer of right calf limited to breakdown of skin E11.622 Type 2 diabetes mellitus with other skin ulcer Facility Procedures CPT4 Code Description Modifier Quantity [id number] (Facility Use Only) [id number] - APPLY MULTLAY COMPRS LWR RT LEG 1 Electronic Signature(s) Signed: 03/16/2019 4:44:35 PM By: Linton Ham MD Signed: 03/20/2019 5:56:42 PM By: Kela Millin Entered By: Kela Millin on 03/16/2019 15:57:21

## 2019-03-20 NOTE — Progress Notes (Signed)
   Subjective:   Patient ID: [name] [name], female    DOB: 12-09-1945, 73 y.o.   MRN: [id number]  HPI The patient is a 73 YO female coming in for physical.   PMH, [place], social history reviewed and updated  Review of Systems  Constitutional: Positive for activity change.  HENT: Negative.   Eyes: Negative.   Respiratory: Positive for shortness of breath. Negative for cough and chest tightness.   Cardiovascular: Positive for leg swelling. Negative for chest pain and palpitations.  Gastrointestinal: Negative for abdominal distention, abdominal pain, constipation, diarrhea, nausea and vomiting.  Musculoskeletal: Positive for arthralgias, gait problem and myalgias.  Skin: Positive for wound.  Psychiatric/Behavioral: Negative.     Objective:  Physical Exam Constitutional:      Appearance: She is well-developed. She is obese.  HENT:     Head: Normocephalic and atraumatic.  Cardiovascular:     Rate and Rhythm: Normal rate and regular rhythm.  Pulmonary:     Effort: Pulmonary effort is normal. No respiratory distress.     Breath sounds: Normal breath sounds. No wheezing or rales.  Abdominal:     General: Bowel sounds are normal. There is no distension.     Palpations: Abdomen is soft.     Tenderness: There is no abdominal tenderness. There is no rebound.  Musculoskeletal:     Cervical back: Normal range of motion.  Skin:    General: Skin is warm and dry.     Comments: Legs with lymphedema, stable from usual, feet wrapped  Neurological:     Mental Status: She is alert and oriented to person, place, and time.     Coordination: Coordination normal.     Comments: Wheeled walker for ambulation     Vitals:   03/20/19 1405  BP: 140/80  Pulse: 64  Temp: 98.6 F (37 C)  TempSrc: Oral  SpO2: 98%  Weight: 280 lb (127 kg)  Height: 5\' 11"  (1.803 m)    This visit occurred during the SARS-CoV-2 public health emergency.  Safety protocols were in place, including screening questions  prior to the visit, additional usage of staff PPE, and extensive cleaning of exam room while observing appropriate contact time as indicated for disinfecting solutions.   Assessment & Plan:

## 2019-03-21 ENCOUNTER — Encounter (HOSPITAL_BASED_OUTPATIENT_CLINIC_OR_DEPARTMENT_OTHER): Payer: PPO | Admitting: Internal Medicine

## 2019-03-21 ENCOUNTER — Ambulatory Visit: Payer: PPO

## 2019-03-21 DIAGNOSIS — E11622 Type 2 diabetes mellitus with other skin ulcer: Secondary | ICD-10-CM | POA: Diagnosis not present

## 2019-03-21 DIAGNOSIS — L97212 Non-pressure chronic ulcer of right calf with fat layer exposed: Secondary | ICD-10-CM | POA: Diagnosis not present

## 2019-03-21 DIAGNOSIS — I890 Lymphedema, not elsewhere classified: Secondary | ICD-10-CM

## 2019-03-21 DIAGNOSIS — I872 Venous insufficiency (chronic) (peripheral): Secondary | ICD-10-CM | POA: Diagnosis not present

## 2019-03-21 DIAGNOSIS — L97812 Non-pressure chronic ulcer of other part of right lower leg with fat layer exposed: Secondary | ICD-10-CM | POA: Diagnosis not present

## 2019-03-21 DIAGNOSIS — R2689 Other abnormalities of gait and mobility: Secondary | ICD-10-CM

## 2019-03-21 NOTE — Assessment & Plan Note (Signed)
Flu shot up to date. Pneumonia complete. Shingrix counseled. Tetanus due 2024. Colonoscopy due 2026. Mammogram due 2021, pap smear aged out and dexa due 2021. Counseled about sun safety and mole surveillance. Counseled about the dangers of distracted driving. Given 10 year screening recommendations.

## 2019-03-21 NOTE — Assessment & Plan Note (Signed)
Declines PT at this time due to working intensely with lymphedema clinic.

## 2019-03-21 NOTE — Assessment & Plan Note (Signed)
Mildly improved and working with lymphedema clinic.

## 2019-03-21 NOTE — Therapy (Signed)
[place], [place], [address] Phone: [phone]   Fax:  [phone]  Physical Therapy Treatment  Patient Details  Name: [name] [name] MRN: [id number] Date of Birth: [birth year] Referring Provider (PT): Darci Current   Encounter Date: 03/21/2019  PT End of Session - 03/21/19 [id number]    Visit Number  7    Number of Visits  25    Date for PT Re-Evaluation  03/28/19    PT Start Time  [id number]    PT Stop Time  1515    PT Time Calculation (min)  30 min    Activity Tolerance  Patient tolerated treatment well    Behavior During Therapy  [facility] for tasks assessed/performed       Past Medical History:  Diagnosis Date  . Acute kidney injury ([place])   . Aortic stenosis, mild 11/17/2013  . Arthritis    "both knees, spine, back, fingers" (11/29/2013)  . CHF (congestive heart failure) ([place])   . Edema   . GERD (gastroesophageal reflux disease)   . Heart murmur   . History of diastolic dysfunction    Echo [id number] with diastolic dysfunction  . HTN (hypertension)   . Morbid obesity ([place])   . OA (osteoarthritis)   . PAC (premature atrial contraction)    per prior Holter  . Palpitations   . Pneumonia    "as a child"  . PVC's (premature ventricular contractions)    per prior Holter  . SVT (supraventricular tachycardia) ([place])   . Tachycardia    noted at 07/28/11 visit. Started on beta blocker. Possible atrial flutter vs long PR tachycardia/AVNRT  . Type II diabetes mellitus ([place])     Past Surgical History:  Procedure Laterality Date  . CESAREAN SECTION  1985  . CESAREAN SECTION  1985  . SUPRAVENTRICULAR TACHYCARDIA ABLATION  11/29/2013  . SUPRAVENTRICULAR TACHYCARDIA ABLATION N/A 11/29/2013   Procedure: SUPRAVENTRICULAR TACHYCARDIA ABLATION;  Surgeon: Evans Lance, MD;  Location: [facility] CATH LAB;  Service: Cardiovascular;  Laterality: N/A;  . US ECHOCARDIOGRAPHY  07/25/2008   EF 55-60%  . VAGINAL DELIVERY       There were no vitals filed for this visit.  Subjective Assessment - 03/21/19 1412    Subjective  pt reports that the extra wrap up the knee bunched down but she did not remove it. She did wear her tubigrip over her L thigh and knee for light compression. Pt stated she was contacted by [employer] and her pump was approved.    Pertinent History  osteo arthritis, hx cellulitis, phlebitis and history of edema in BLE, Hx Acute kidney injury, CHF and DM II    Patient Stated Goals  I want to get this swelling down and heal my wound.    Currently in Pain?  Yes    Pain Score  7     Pain Location  Knee    Pain Orientation  Right;Left    Pain Descriptors / Indicators  Aching    Pain Type  Chronic pain    Pain Onset  More than a month ago    Pain Frequency  Intermittent    Aggravating Factors   movement    Pain Relieving Factors  rest                       [facility] Adult PT Treatment/Exercise - 03/21/19 0001      Manual Therapy   Manual Therapy  Edema management;Compression Bandaging  Manual therapy comments  Wound care pt was washed and debrided by the wound center. This physical therapist applied traimcinolone acetonide and moisturizer priot to donning compression wrap per MD order. ABD pads were used at the distal lateral aspect of the lower legand at the proximal aspect of the lateral lower leg. Hydrofera blue was used over one small area on the L lateral lower leg and one area on the proximal lateral lower leg near the top of the wrap where it looks like a small amount of sanguinous drainage was on the tubigrip.     Edema Management  Pt will continue to wear the tubigrip for the thigh     Compression Bandaging  Pt was wrapped with 3 artiflex for shaping and 1 8 cm roman sandal, 1 10 cm for ankle sole heel and 2 cm spiraling up the lower leg and just above the knee. 1 size tubigrip was used for the lower leg and a larger tubigrip was provided for the knee and thigh. Smaller  tubigrip was placed as a base layer and ABD pad at the L ankle from dorsum of foot to ankle for good transition. Toes were wrapped with 2 inch elastomull folded for digits 1-5.              PT Education - 03/21/19 1521    Education Details  Pt will continue with the tubigrip over her L knee and thigh as much as possible. Re-iterated education on when to remove te wraps including pain, tingling, numbness/throbbing that is norelieved with movement or removing the top wrap that was placed over the thigh and knee.    Person(s) Educated  Patient    Methods  Explanation    Comprehension  Verbalized understanding       PT Short Term Goals - 02/21/19 1654      PT SHORT TERM GOAL #1   Title  Pt will be independent with HEP    Baseline  Pt does not have an HEP    Time  6    Period  Weeks    Status  New    Target Date  04/11/19        PT Long Term Goals - 02/21/19 1654      PT LONG TERM GOAL #1   Title  Patient will have reduction of L lower leg limb girth by 3 cm    Baseline  See measurements    Time  12    Period  Weeks    Status  New    Target Date  05/23/19      PT LONG TERM GOAL #2   Title  Patient to be properly fitted with compression garment to wear on daily basis.    Baseline  Pt is unable to fit her compression garments and has no compression for the thighs.    Time  12    Period  Weeks    Status  New    Target Date  05/23/19      PT LONG TERM GOAL #3   Title  Patient will be independent in self-care management principles including self-massage/bandaging and long term management plan for edema.    Baseline  Pt has unmanaged lymphedema that results in chronic wound    Time  12    Period  Weeks    Status  New    Target Date  05/23/19            Plan - 03/21/19 1411  Clinical Impression Statement  Pt presented today with her wrap still on from her last session. Wound center did wound care prior to physical therapy. Physical therapist performed wound  dressing. Pt has significant improvement in champagne glass deformity in the L lower leg this session with extra wrap that went over the knee/distal thigh last session. Pt also reports that she has been consistent with wearing her compression tubigrip over the L knee/thigh. Pt reports that her flexitouch pump was approved which will be very beneficial in pumping fluid out from the appropriate level when using vasopneumatic pump. Small amount of sanguinous drainage noted on the proximal portion of the tubigrip that was removed; abd pad was placed in this area with small amount of absorbent material in order to decrease leakage and skin irritation. Compression wrap was placed using short stretch bandages from the foot to right above the knee and tubigrip was used to hold it in place. Pt will benefit from continued POC at this time.    Personal Factors and Comorbidities  Age;Comorbidity 3+    Comorbidities  CHF, acute kidney injury, DM II    PT Frequency  2x / week    PT Duration  12 weeks    PT Treatment/Interventions  Electrical Stimulation;Cryotherapy;Gait training;Stair training;Functional mobility training;Therapeutic activities;Therapeutic exercise;Balance training;Neuromuscular re-education;Patient/family education;Manual lymph drainage;Compression bandaging;Passive range of motion;Taping;Vasopneumatic Device    PT Next Visit Plan  Assess wrap, give exercises, assess self MLD and possible work out pants?    PT Home Exercise Plan  ask about pump, provide exercises    Consulted and Agree with Plan of Care  Patient       Patient will benefit from skilled therapeutic intervention in order to improve the following deficits and impairments:  Difficulty walking, Increased edema  Visit Diagnosis: Lymphedema  Other abnormalities of gait and mobility     Problem List Patient Active Problem List   Diagnosis Date Noted  . Postnasal drip 11/16/2017  . Sensorineural hearing loss (SNHL), bilateral  11/16/2017  . Tinnitus of both ears 11/16/2017  . Obesity, Class II, BMI 35-39.9, isolated (see actual BMI) 11/08/2017  . CKD (chronic kidney disease), stage III 11/08/2017  . Anemia 11/07/2017  . Thrombocytosis ([place]) 11/07/2017  . Lymphedema 11/07/2017  . Routine general medical examination at a health care facility 05/10/2014  . Paroxysmal SVT (supraventricular tachycardia) ([place]) 11/28/2013  . Physical deconditioning 11/23/2013  . Generalized weakness 11/17/2013  . Nausea 11/17/2013  . Multilevel spine pain 11/17/2013  . Aortic stenosis, mild 11/17/2013  . DM type 2 (diabetes mellitus, type 2) ([place]) 02/02/2013  . Arthritis 10/19/2012  . Mixed incontinence 10/19/2012  . GERD (gastroesophageal reflux disease) 04/20/2011  . HTN (hypertension) 01/20/2011  . Morbid obesity ([place])     Ander Purpura, PT 03/21/2019, 3:26 PM  [place], [place], [address] Phone: [phone]   Fax:  [phone]  Name: [name] [name] MRN: [id number] Date of Birth: [birth year]

## 2019-03-21 NOTE — Assessment & Plan Note (Addendum)
Foot exam done based on patient reports from lymphedema clinic. She is wrapped in tight compression wraps which cannot be removed. Recent HgA1c at goal on metformin treatment. Checking lipid panel and adjust as needed. Not on statin or ACE-I/ARB (recent AKI). Previously was on ARB chronically.

## 2019-03-22 NOTE — Progress Notes (Signed)
SAMANTHE, REMLINGER ([id number]) Visit Report for 03/21/2019 HPI Details Patient Name: Date of Service: [name] [name], [name] [name] 03/21/2019 1:00 PM Medical Record [id number] Patient Account Number: [id number] Date of Birth/Sex: Treating RN: 11-14-1945 (73 y.o. F) Primary Care Provider: [name] Other Clinician: Referring Provider: Treating Provider/Extender:Asya Derryberry, Tenna Child, [place] Siren in Treatment: 10 History of Present Illness HPI Description: 04/09/15; the patient is here for review of wounds on her bilateral lower extremities for the last month. The patient has a history of lymphedema and bilateral chronic venous insufficiency with inflammation she was here for review of wounds on her bilateral legs in 2014 she was discharged home with external compression pumps which she is currently not using. Over the last month she developed wounds on her bilateral lower legs with drainage and pain and she is here for our review of this. 04/16/15; the patient's edema is under much better control. She has 3 areas one on the right anterior medial leg one on the left posterior leg and a small open area with purulent drainage on the left anterior leg. I have cultured the area on the left anterior leg 04/23/15; continued improvement in the patient's edema. All her wounds of closed I see no open areas. Culture from the left anterior leg last time was negative. READMISSION 02/07/16; this patient is a woman that I saw her earlier in 2017. I believe we discharged her very quickly with bilateral wounds in her legs secondary to chronic venous hypertension with inflammation and secondary lymphedema. She had juxtalite stockings. She has external compression pumps at home from a stay in our clinic in 2014. She tells me that she's had wounds on her bilateral lower legs since March or April of this year. 3 wounds on the left 2 on the right. She is a type II diabetic and has a history of  noncompressible vessels bilaterally although she is tolerated 4-layer compression. The last note from her primary physician Dr. [name] was in May. At that point Dr. [name] had referred the patient here for lymphedema management. The patient stated she made a phone call to year but was not able to arrange an appointment. It is not clear that she is actually using anything on the wounds currently except some gauze that was saturated with drainage per our intake nurse. She is certainly not using her juxtalite stockings and by the patient's own admission she is not using her compression pumps because they are "irritating". I don't see a recent hemoglobin A1c. The patient states she has not been systemically unwell but the legs are painful 02/14/16; patient tolerated her Profore wraps reasonably well. She is using the compression pumps 1 time per day. Measurements of her legs are better in terms of the amount of lymphedema 02/21/16; patient tolerated her Profore wraps it. She says she is using her compression pumps one or 2 times a day. Measurements of her leg circumference are improved and most of her wound dimensions are better, unfortunately the home health that we referred her to did not except her insurance but works successful in getting her accompanied that would except her insurance however she apparently has a $25 co-pay which she cannot afford as she is on a fixed income 03/05/16; patient states she did not back using the pumps. She is healed that 2 wounds on the left leg. She still has areas on the right lateral lower leg, right medial lower leg and left lateral lower leg. All of these look some better and have reduced in  area 03/13/16; still 3 wounds 1 on the left lateral leg, one on the left medial/posterior leg which is really the most extensive and a small area on the right lateral leg. All of this in the setting of severe chronic lymphedema and chronic  venous inflammation. Damage to the skin with cobblestone thickened skin. 03/20/16; still 2 areas. The area on the left lateral leg appears to of closed over. The right medial leg is still most extensive wound although it appears to be closing over right lateral only a small open area remains. Using silver alginate Profore wraps 03/27/16; the area on the left leg remains closed. The edema control is better on the left than the right. The right medial leg is still weeping but everything appears to be epithelialized. The lateral aspect of the right leg appears closed. She tells me she has old juxtalite stockings. She has been compliant with her compression pumps. 04/03/16; the area on the left leg remains closed. She has ordered new juxtalite stockings. The right posterior medial wound is fully epithelialized. However she has an irritated area over the right Achilles that she states is been itching all week under the wraps.. She has been compliant with her compression pumps 04/10/16; the area on the left leg remains closed. The right posterior medial wound is also closed today. The irritated area over the right Achilles which was probably wrap injury is also fully epithelialized and closed. The patient has not received her new juxtalite stockings, she arrives in clinic today with an old juxtalite stocking in place. Sheis using her compression pumps secondary to her severe stage III lymphedema READMISSION 01/21/17; this is a patient we know from several previous stays in this clinic. She has lymphedema with chronic venous insufficiency and inflammation. The last time she was here we discharged her with bilateral juxta lites which she is compliant with. She also has external compression pumps at home from stays in our clinic in 2014 although she tells me that she is not very compliant with these as when she uses them a causes pain in her bilateral knees the patient states that her wounds on her left  greater than right leg including 2 large areas on the left anterior and left lateral leg and an area on the right leg open in late June or July since then she is been followed by [facility] and she's had bilateral Unna boot supplied every week for the last month.. I think the wounds have actually improved although they are not specifically dressed. She states she had an ultrasound to rule out DVT bilaterally that was negative. She does not have arterial studies although she is a diabetic. She states she has a lot of pain in her legs she states she does not use her external compression pumps because it causes pain in her knees. As usual her ABIs were noncompressible bilaterally in this clinic 01/28/17; currently the patient only has one small wound on the left lateral leg and I think this should be healed next week. We are going to try to get her bilateral extremitease stockings. She tells me that compression pumps heard her posterior rib arthritic knees so she is not been compliant with this 02/04/17; the patient's wounds are totally healed. She has chronic venous insufficiency and secondary lymphedema. She has new extremitease stockings. She also has compression pumps Readmission: 04/21/17 on evaluation today patient appears to be doing about the same as what she was previous when we were evaluating her back  in November 2018. At that point she had completely healed. With that being said she has now reopened and states that she wasn't aware that she was supposed to come back. 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 Subsequently she also states that although she has the compression that we got for her previously the extremitease stockings she nonetheless doesn't always wear these and being noncompliant often has things will reopen as far as ulcers on her lower extremities. No fevers,  chills, nausea, or vomiting noted at this time. Patient has no evidence of dementia. She is having some discomfort in regard to the ulcers at this point. 04/28/17 on evaluation today patient appears to be doing significantly better in regard to her wounds. With that being said she is still having significant discomfort and is not really keen on sharp debridement. She does not appear to have any evidence of infection which is good news I do think the Iodoflex is beneficial for her. Of note her ABI's were found in the system to be on the right 1.21 with a TBI of 0.76 and on the left 1.35 with a TBI of 0.75. In general her swelling appears to be significantly improved however. No fevers, chills, nausea, or vomiting noted at this time. Patient has been tolerating the wraps without complication. She has no evidence of dementia. 05/05/17-she is here in follow-up evaluation for bilateral lower extremity venous and lymphedema ulcers. She states she has not been using her lymphedema pumps secondary to pain at her knees. After discussion it was noted that she uses knee-high lymphedema pumps but does have a pair of thigh-high lymphedema pumps. She was strongly advised to use the thigh-high lymphedema pumps and if necessary reduce the setting for improved tolerance. She reports no change in odor and/or drainage. She continues to tolerate sharp debridement poorly with significant pain, primarily to the right lower extremity. We will continue with current treatment plan, along with improved lymphedema pump use and follow-up next week 05/12/17 on evaluation today patient actually appears to be doing much better in regard to her bilateral lower extremity ulcers. Only the right altar actually requires debridement today. The other two cleaned off nicely in two of the three in fact on the left lower extremity or actually almost completely healed. She does have some weeping noted still the bilateral lower extremities left  greater than right. She still is not able to use her compression pumps even though I have mentioned this several times to her as did Leah last week. No fevers, chills, nausea, or vomiting noted at this time. Patient has been tolerating the dressing changes without complication. Patient has no evidence of dementia 05/19/17 on evaluation today patient appears to be doing very well regard to her bilateral lower extremity ulcers. In fact she seems to be improving and in fact healed in a couple of locations that were giving her trouble last week. Overall I'm extremely pleased with how things have progressed. She still has some discomfort especially in the right lateral lower extremity. Fortunately there does not appear to be any evidence of significant infection which is good news she does tell me that she has used her compression pumps several times although not routinely over the past week I did praise her for this. I do think it will be beneficial and that might even be what's helping with her wounds as far as healing  is concerned at least to a degree. No fevers, chills, nausea, or vomiting noted at this time. Patient has no dementia and no evidence of infection. 05/26/17 on evaluation today patient presents for evaluation concerning her bilateral lower extremity ulcer secondary to lymphedema. Fortunately she appears to be doing very well at this point in time. Specifically her right leg appears to be completely closed and healed. The left leg though there is still a small area of opening overall has healed. She does have a little bit of excoriation at the top or the wraps are because they slid down on her. Otherwise there's no evidence of infection and the other has improved dramatically now that she is not draining as significantly. Patient is having no significant pain she tells me she has been able to use her lymphedema pumps one time a day although that's not all she can tolerate. Even that is  difficult for her. 06/02/17 on evaluation today patient's ulcers appear to be completely closed at this point. She does still note some odor but I believe this is due to the fact she has not been able to wash her legs as well currently. Fortunately she did bring her compression with her today. She does have the EXTREMIT-EASE Compression. With that being said she is somewhat nervous about going back to this as previously she broke out yet again once we discontinue wrapping her. READMISSION 11/23/17; patient is now 73 year old diabetic woman with severe chronic venous insufficiency with lymphedema. We care for her last in this clinic in February 2019 at which time she had chronic venous insufficiency with lymphedema bilateral lower extremity ulcers. We discharged her with bilateral wrap around/extremitease stockings and compression pumps. Is fairly clear she is not using the compression pumps stating it causes pain in her knees. In any case she was admitted to hospital from 8/3 through 8/6 with a several week history of recurrent bilateral ulcers. She was felt to have bilateral cellulitis treated with doxycycline IV. She is still on oral doxycycline and has another 2 days worth. At discharge her white count was 14.1 hemoglobin at 8.9. The patient is a diabetic however her last ABIs in 2018 showed a right ABI of 1.21, left of 1.35. TBI's were 0.76 on the right 0.75 on the left. Posterior tibial artery was monophasic on the right triphasic on the left. Dorsalis pedis triphasic on the right and triphasic on the left. She is not felt to have significant macrovascular disease 12/02/17;significant bilateral lower extremity wounds secondary to severe uncontrolled lymphedema. She did not get home health out to change the wraps. So she kept this on all week. She is telling me she using his her compression pumps once a week. In general all the wounds look better. We've been using silver alginate with secondary  absorptive dressings 4 layer compression 12/10/17; bilateral lower extremity wounds secondary to uncontrolled lymphedema. She tells me she is using her compression pumps on most days. We've been keeping wraps on all week. Using silver alginate. She has wounds on the right lateral calf left medial and left lateral calf. All of this is somewhat better 12/16/17; the patient has 3 small open areas. One on the right lateral calf, one on the left medial calf and the small area on the left lateral calf. She has lymphedema, chronic venous insufficiency with hemosiderin deposition. Very fibrotic distal lower extremity scan with suggestion of an inverted bottle sign appearance to her legs. She has compression pumps but she does not use  them has not used them in the last week. Currently we have her in 4 layer compression, silver alginate to the wounds and this is being changed once by home health. She is noncompliant with the compression pumps because she says it hurts her knees. She also has compression stockings ando Juxta light stockings at home 12/24/17; the patient's right lateral calf is healed as well as the left medial. There is no open wound on the right leg. She still has 2 areas on the left lateral calf. We transitioned the right leg into an extremitease stocking which she brought in from home. 12/31/17; the patient has a superficial wound on the left lateral lower calf. We've been wrapping this leg. She arrives today with the right leg swollen but without a wound. She cannot get her extremities stocking on the right leg. There is a lot more edema here. She is not using her compression pumps 01/10/2018; patient has a same superficial wound on the left lateral calf. Her right leg has less peripheral edema. She tells me she also started herself on some Lasix that had been previously prescribed for her at some point but she had never taken it. She is also concerned about whether her compression pumps are  leaking. She has extremitease stockings however the edema gets bad enough she can get these on. Currently she has no open wound on the right leg and is mention the edema is actually better than when I saw this 10 days ago 01/17/2018; patient has a same superficial area on the posterior left calf and a weeping area in the mid part of the right lateral calf. She still does not have anyone from [facility] coming to see her compression pumps which she describes as being suspicious for a leakage. I am not really sure how frequently she is using this in any case. She has massive bilateral calf lymphedema. I think there is some spreading lymphedema into her posterior thighs. 01/24/2018; the patient's edema in her legs is actually some better. She still has a smaller area on the left posterior calf although this is better. The weeping area on the right lateral calf is also better. As it turned out she did not get her pumps from [facility] but [facility] and they are going to try to go out and see these this week which is going to help. I explained to her that she will use the compression pumps once a day while we are wrapping her legs but when she transitions to stockings that may be she will require pumping twice a day. Her compliance in this area has not been high and I wonder about her ability to do this herself. 02/01/2018; the area on the posterior left leg is healed. She still has the original wound on the right lateral calf which is epithelialized. The however just above this there is still weeping lymphedema fluid. She has a new area on the upper right calf which is either wrap injury or is she points out where she has been scratching under the wraps. We still not have managed to get a hold of a representative of [facility] to see if the pump is operational. She has extremitease stockings at home however I am not of the opinion that this will maintain her skin  integrity without the compression pumps. 02/08/2018; posterior left leg remains healed although she has a new small open area on the left lateral calf. Right lateral calf has two quarter sized open  areas and close juxtaposition. There is less weeping edema fluid. She apparently managed to contact [facility]. They are sending her a part. But she is still not received it 02/15/2018; the patient has no open wound on either leg. Some scale present on the right upper lateral calf just below the fibular head. Edema control is excellent. She did not bring her stockings or her juxta lite wrap around. She has the part for her compression pump but she has not use the compression pumps yet READMISSION 01/10/2019 Patient is now a 73 year old woman that we have had in the clinic multiple times in the past. She has chronic lower extremity lymphedema, recurrent wounds on her bilateral lower extremities. She is also a type II diabetic reasonably well controlled with a recent hemoglobin A1c while she was in the hospital of 5.9. She has wounds on her bilateral lower extremities mostly on the left lateral and right lateral. These were noted also during her recent hospitalization. The patient states they have been there for a while. She has not been able to wear her stockings he has trouble getting them on as she lives alone. She also has external compression pumps but uses them sparingly because it causes pain in her knees which hurt because of osteoarthritis. The patient was on doxycycline before she went into the hospital apparently for fear of infection in her legs and is on Keflex coming out because of a UTI The patient was recently in the hospital from 01/06/2019 through 01/09/2019. She was admitted with nausea and vomiting and prerenal azotemia. This was corrected with IV fluid her Lasix has been put on hold. Hemoglobin A1c at 5.9 her metformin was discontinued. She was also felt to have a UTI she was  prescribed Keflex at 503 times a day she is picking that up today. The patient is not felt to have an arterial issue. ABIs in our clinic were 1.24 on the right and 1.06 on the left. She had formal studies in 2018 at which time her ABIs were 1.21 on the right TBI of 0.76 and on the left 1.35 and 0.75 respectively. Waveforms are on the right were monophasic and biphasic, triphasic and biphasic on the left. She is tolerated 4 layer compression in the past 10/13. Comes in today having not used her compression pumps /perhaps once in the last week. She has too much edema to consider healing these wounds. We have been using silver alginate 10/20; she tells me he had she used her compression pumps once a day as we asked. Clearly she has edema out of her legs although most of it seems to be settling around her knees which is what she complains about. We have been using silver alginate to the extensive de-epithelialized area on the left lateral calf and a much smaller area on the right lateral extending linearly into the right posterior 10/27; she is using her compression pumps daily. Her edema control is better. We have been using silver alginate to the extensive wound areas on her bilateral lower legs. This includes left anterior left lateral and left posterior. Right anterior right lateral and right posterior. The wounds are not all in the same condition. She has severe bilateral skin damage with the epithelialization, flaking dried epithelium 11/3; she is using her compression pumps 1 times daily. Pretty obvious that her wraps fell down especially on the left leg to mid calf. I think things are improving on the right lateral calf, I do not see anything open  posteriorly. The major areas on the left lateral where she has 3 or 4 deeper wounds in the midst of an entire de-epithelialized area. There is erythema here but no tenderness I think this represents venous stasis rather than cellulitis. I debrided  the areas on the left lateral calf last week but once again they have tightly adherent debris over the top of them which is disappointing 11/10; the patient's major areas on the left lateral calf to 3 wounds with some depth surrounded by a large area of de-epithelialized tissue. There is no infection. We have been using silver alginate under 4-layer compression. On the right lateral she has a more contained wound area that is superficial. Been using silver alginate under 4-layer compression 11/17; the patient's major wound is on the left lateral calf. We have been using silver alginate under compression. There is no open wound per se on the right but it de-epithelialized area on the lateral calf that is weeping edema fluid. I do not see much change here today. 12/1; patient arrives in clinic with her wounds looking better. Her edema control is better. The lymphedema specialist working on the left leg we have been working on the right. We have been using 4-layer compression. The patient is using a consider external compression pumps and Hydrofera Blue to the open areas. She is being measured for an abdominal compression pump and apparently that is going to go through her insurance 12/15; continued improvement. 2 small areas on the left and slightly larger 2 small areas on the right. Electronic Signature(s) Signed: 03/21/2019 5:53:46 PM By: Linton Ham MD Entered By: Linton Ham on 03/21/2019 14:32:13 -------------------------------------------------------------------------------- Physical Exam Details Patient Name: Date of Service: [name] [name], [name] [name] 03/21/2019 1:00 PM Medical Record [id number] Patient Account Number: [id number] Date of Birth/Sex: Treating RN: 1945-06-02 (73 y.o. F) Primary Care Provider: [name] Other Clinician: Referring Provider: Treating Provider/Extender:Nathanyel Defenbaugh, Tenna Child, [place] Siren in Treatment: 10 Constitutional Patient is  hypertensive.. Pulse regular and within target range for patient.Marland Kitchen Respirations regular, non-labored and within target range.. Temperature is normal and within the target range for the patient.Marland Kitchen Appears in no distress. Eyes Conjunctivae clear. No discharge.no icterus. Respiratory work of breathing is normal. Cardiovascular Pedal pulses are easily palpable. We have very good edema control bilaterally.. Integumentary (Hair, Skin) Changes of chronic venous insufficiency and lymphedema. Psychiatric appears at normal baseline. Notes Wound exam On the left lateral the substantial wound has closed. On the right posterior and lateral the we still have open areas although they look reasonably healthy Left 2 small areas 1 superior and lateral and one anterior. Electronic Signature(s) Signed: 03/21/2019 5:53:46 PM By: Linton Ham MD Entered By: Linton Ham on 03/21/2019 14:34:13 -------------------------------------------------------------------------------- Physician Orders Details Patient Name: Date of Service: [name] [name], [name] [name] 03/21/2019 1:00 PM Medical Record [id number] Patient Account Number: [id number] Date of Birth/Sex: Treating RN: 11-Aug-1945 (73 y.o. [name] Primary Care Provider: [name] Other Clinician: Referring Provider: Treating Provider/Extender:Giannina Bartolome, Tenna Child, [place] Siren in Treatment: 10 Verbal / Phone Orders: No Diagnosis Coding ICD-10 Coding Code Description I89.0 Lymphedema, not elsewhere classified L97.221 Non-pressure chronic ulcer of left calf limited to breakdown of skin L97.211 Non-pressure chronic ulcer of right calf limited to breakdown of skin E11.622 Type 2 diabetes mellitus with other skin ulcer Follow-up Appointments Return Appointment in 2 weeks. Nurse Visit: - 1 week Skin Barriers/Peri-Wound Care Barrier cream Moisturizing lotion TCA Cream or Ointment Wound Cleansing May shower and wash wound with  soap and water. Primary Wound Dressing Wound #  28 Right,Lateral Lower Leg Hydrofera Blue - ready Wound #31 Right,Distal,Posterior Lower Leg Hydrofera Blue - ready Wound #32 Left,Anterior Lower Leg Hydrofera Blue - ready Wound #33 Left,Proximal,Lateral Lower Leg Hydrofera Blue - ready Secondary Dressing Wound #28 Right,Lateral Lower Leg ABD pad Wound #31 Right,Distal,Posterior Lower Leg ABD pad Wound #32 Left,Anterior Lower Leg ABD pad Wound #33 Left,Proximal,Lateral Lower Leg ABD pad Edema Control 4 layer compression - Right Lower Extremity - unna boot at top to help secure wrap Other: - left leg: PT to apply lymphedema wraps Electronic Signature(s) Signed: 03/21/2019 5:53:46 PM By: Linton Ham MD Signed: 03/22/2019 9:49:12 AM By: Carlene Coria RN Entered By: Carlene Coria on 03/21/2019 13:24:43 -------------------------------------------------------------------------------- Problem List Details Patient Name: Date of Service: [name] [name], [name] [name] 03/21/2019 1:00 PM Medical Record [id number] Patient Account Number: [id number] Date of Birth/Sex: Treating RN: [birth year] (73 y.o. [place] Lo, [place] Primary Care Provider: [name] Other Clinician: Referring Provider: Treating Provider/Extender:Athol Bolds, Tenna Child, [place] Siren in Treatment: 10 Active Problems ICD-10 Evaluated Encounter Code Description Active Date Today Diagnosis I89.0 Lymphedema, not elsewhere classified 01/10/2019 No Yes L97.221 Non-pressure chronic ulcer of left calf limited to 01/10/2019 No Yes breakdown of skin L97.211 Non-pressure chronic ulcer of right calf limited to 01/10/2019 No Yes breakdown of skin E11.622 Type 2 diabetes mellitus with other skin ulcer 01/10/2019 No Yes Inactive Problems Resolved Problems Electronic Signature(s) Signed: 03/21/2019 5:53:46 PM By: Linton Ham MD Entered By: Linton Ham on 03/21/2019  14:28:31 -------------------------------------------------------------------------------- Progress Note Details Patient Name: Date of Service: [name] [name], [name] [name] 03/21/2019 1:00 PM Medical Record [id number] Patient Account Number: [id number] Date of Birth/Sex: Treating RN: [birth year] (73 y.o. F) Primary Care Provider: [name] Other Clinician: Referring Provider: Treating Provider/Extender:Akirra Lacerda, Tenna Child, [place] Siren in Treatment: 10 Subjective History of Present Illness (HPI) 04/09/15; the patient is here for review of wounds on her bilateral lower extremities for the last month. The patient has a history of lymphedema and bilateral chronic venous insufficiency with inflammation she was here for review of wounds on her bilateral legs in 2014 she was discharged home with external compression pumps which she is currently not using. Over the last month she developed wounds on her bilateral lower legs with drainage and pain and she is here for our review of this. 04/16/15; the patient's edema is under much better control. She has 3 areas one on the right anterior medial leg one on the left posterior leg and a small open area with purulent drainage on the left anterior leg. I have cultured the area on the left anterior leg 04/23/15; continued improvement in the patient's edema. All her wounds of closed I see no open areas. Culture from the left anterior leg last time was negative. READMISSION 02/07/16; this patient is a woman that I saw her earlier in 2017. I believe we discharged her very quickly with bilateral wounds in her legs secondary to chronic venous hypertension with inflammation and secondary lymphedema. She had juxtalite stockings. She has external compression pumps at home from a stay in our clinic in 2014. She tells me that she's had wounds on her bilateral lower legs since March or April of this year. 3 wounds on the left 2 on the right. She is a type  II diabetic and has a history of noncompressible vessels bilaterally although she is tolerated 4-layer compression. The last note from her primary physician Dr. [name] was in May. At that point Dr. [name] had referred the patient here for lymphedema management. The patient stated she made  a phone call to year but was not able to arrange an appointment. It is not clear that she is actually using anything on the wounds currently except some gauze that was saturated with drainage per our intake nurse. She is certainly not using her juxtalite stockings and by the patient's own admission she is not using her compression pumps because they are "irritating". I don't see a recent hemoglobin A1c. The patient states she has not been systemically unwell but the legs are painful 02/14/16; patient tolerated her Profore wraps reasonably well. She is using the compression pumps 1 time per day. Measurements of her legs are better in terms of the amount of lymphedema 02/21/16; patient tolerated her Profore wraps it. She says she is using her compression pumps one or 2 times a day. Measurements of her leg circumference are improved and most of her wound dimensions are better, unfortunately the home health that we referred her to did not except her insurance but works successful in getting her accompanied that would except her insurance however she apparently has a $25 co-pay which she cannot afford as she is on a fixed income 03/05/16; patient states she did not back using the pumps. She is healed that 2 wounds on the left leg. She still has areas on the right lateral lower leg, right medial lower leg and left lateral lower leg. All of these look some better and have reduced in area 03/13/16; still 3 wounds 1 on the left lateral leg, one on the left medial/posterior leg which is really the most extensive and a small area on the right lateral leg. All of this in the setting of severe chronic lymphedema  and chronic venous inflammation. Damage to the skin with cobblestone thickened skin. 03/20/16; still 2 areas. The area on the left lateral leg appears to of closed over. The right medial leg is still most extensive wound although it appears to be closing over right lateral only a small open area remains. Using silver alginate Profore wraps 03/27/16; the area on the left leg remains closed. The edema control is better on the left than the right. The right medial leg is still weeping but everything appears to be epithelialized. The lateral aspect of the right leg appears closed. She tells me she has old juxtalite stockings. She has been compliant with her compression pumps. 04/03/16; the area on the left leg remains closed. She has ordered new juxtalite stockings. The right posterior medial wound is fully epithelialized. However she has an irritated area over the right Achilles that she states is been itching all week under the wraps.. She has been compliant with her compression pumps 04/10/16; the area on the left leg remains closed. The right posterior medial wound is also closed today. The irritated area over the right Achilles which was probably wrap injury is also fully epithelialized and closed. The patient has not received her new juxtalite stockings, she arrives in clinic today with an old juxtalite stocking in place. Sheis using her compression pumps secondary to her severe stage III lymphedema READMISSION 01/21/17; this is a patient we know from several previous stays in this clinic. She has lymphedema with chronic venous insufficiency and inflammation. The last time she was here we discharged her with bilateral juxta lites which she is compliant with. She also has external compression pumps at home from stays in our clinic in 2014 although she tells me that she is not very compliant with these as when she uses them a causes  pain in her bilateral knees the patient states that her wounds on  her left greater than right leg including 2 large areas on the left anterior and left lateral leg and an area on the right leg open in late June or July since then she is been followed by [facility] and she's had bilateral Unna boot supplied every week for the last month.. I think the wounds have actually improved although they are not specifically dressed. She states she had an ultrasound to rule out DVT bilaterally that was negative. She does not have arterial studies although she is a diabetic. She states she has a lot of pain in her legs she states she does not use her external compression pumps because it causes pain in her knees. As usual her ABIs were noncompressible bilaterally in this clinic 01/28/17; currently the patient only has one small wound on the left lateral leg and I think this should be healed next week. We are going to try to get her bilateral extremitease stockings. She tells me that compression pumps heard her posterior rib arthritic knees so she is not been compliant with this 02/04/17; the patient's wounds are totally healed. She has chronic venous insufficiency and secondary lymphedema. She has new extremitease stockings. She also has compression pumps Readmission: 04/21/17 on evaluation today patient appears to be doing about the same as what she was previous when we were evaluating her back in November 2018. At that point she had completely healed. With that being said she has now reopened and states that she wasn't aware that she was supposed to come back. 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 Subsequently she also states that although she has the compression that we got for her previously the extremitease stockings she nonetheless doesn't always wear these and being noncompliant often has things will reopen as far as ulcers on her lower extremities. No  fevers, chills, nausea, or vomiting noted at this time. Patient has no evidence of dementia. She is having some discomfort in regard to the ulcers at this point. 04/28/17 on evaluation today patient appears to be doing significantly better in regard to her wounds. With that being said she is still having significant discomfort and is not really keen on sharp debridement. She does not appear to have any evidence of infection which is good news I do think the Iodoflex is beneficial for her. Of note her ABI's were found in the system to be on the right 1.21 with a TBI of 0.76 and on the left 1.35 with a TBI of 0.75. In general her swelling appears to be significantly improved however. No fevers, chills, nausea, or vomiting noted at this time. Patient has been tolerating the wraps without complication. She has no evidence of dementia. 05/05/17-she is here in follow-up evaluation for bilateral lower extremity venous and lymphedema ulcers. She states she has not been using her lymphedema pumps secondary to pain at her knees. After discussion it was noted that she uses knee-high lymphedema pumps but does have a pair of thigh-high lymphedema pumps. She was strongly advised to use the thigh-high lymphedema pumps and if necessary reduce the setting for improved tolerance. She reports no change in odor and/or drainage. She continues to tolerate sharp debridement poorly with significant pain, primarily to the right lower extremity. We will continue with current treatment plan, along with improved lymphedema pump use and  follow-up next week 05/12/17 on evaluation today patient actually appears to be doing much better in regard to her bilateral lower extremity ulcers. Only the right altar actually requires debridement today. The other two cleaned off nicely in two of the three in fact on the left lower extremity or actually almost completely healed. She does have some weeping noted still the bilateral lower  extremities left greater than right. She still is not able to use her compression pumps even though I have mentioned this several times to her as did Leah last week. No fevers, chills, nausea, or vomiting noted at this time. Patient has been tolerating the dressing changes without complication. Patient has no evidence of dementia 05/19/17 on evaluation today patient appears to be doing very well regard to her bilateral lower extremity ulcers. In fact she seems to be improving and in fact healed in a couple of locations that were giving her trouble last week. Overall I'm extremely pleased with how things have progressed. She still has some discomfort especially in the right lateral lower extremity. Fortunately there does not appear to be any evidence of significant infection which is good news she does tell me that she has used her compression pumps several times although not routinely over the past week I did praise her for this. I do think it will be beneficial and that might even be what's helping with her wounds as far as healing is concerned at least to a degree. No fevers, chills, nausea, or vomiting noted at this time. Patient has no dementia and no evidence of infection. 05/26/17 on evaluation today patient presents for evaluation concerning her bilateral lower extremity ulcer secondary to lymphedema. Fortunately she appears to be doing very well at this point in time. Specifically her right leg appears to be completely closed and healed. The left leg though there is still a small area of opening overall has healed. She does have a little bit of excoriation at the top or the wraps are because they slid down on her. Otherwise there's no evidence of infection and the other has improved dramatically now that she is not draining as significantly. Patient is having no significant pain she tells me she has been able to use her lymphedema pumps one time a day although that's not all she can tolerate.  Even that is difficult for her. 06/02/17 on evaluation today patient's ulcers appear to be completely closed at this point. She does still note some odor but I believe this is due to the fact she has not been able to wash her legs as well currently. Fortunately she did bring her compression with her today. She does have the EXTREMIT-EASE Compression. With that being said she is somewhat nervous about going back to this as previously she broke out yet again once we discontinue wrapping her. READMISSION 11/23/17; patient is now 73 year old diabetic woman with severe chronic venous insufficiency with lymphedema. We care for her last in this clinic in February 2019 at which time she had chronic venous insufficiency with lymphedema bilateral lower extremity ulcers. We discharged her with bilateral wrap around/extremitease stockings and compression pumps. Is fairly clear she is not using the compression pumps stating it causes pain in her knees. In any case she was admitted to hospital from 8/3 through 8/6 with a several week history of recurrent bilateral ulcers. She was felt to have bilateral cellulitis treated with doxycycline IV. She is still on oral doxycycline and has another 2 days worth. At discharge her  white count was 14.1 hemoglobin at 8.9. The patient is a diabetic however her last ABIs in 2018 showed a right ABI of 1.21, left of 1.35. TBI's were 0.76 on the right 0.75 on the left. Posterior tibial artery was monophasic on the right triphasic on the left. Dorsalis pedis triphasic on the right and triphasic on the left. She is not felt to have significant macrovascular disease 12/02/17;significant bilateral lower extremity wounds secondary to severe uncontrolled lymphedema. She did not get home health out to change the wraps. So she kept this on all week. She is telling me she using his her compression pumps once a week. In general all the wounds look better. We've been using silver alginate  with secondary absorptive dressings 4 layer compression 12/10/17; bilateral lower extremity wounds secondary to uncontrolled lymphedema. She tells me she is using her compression pumps on most days. We've been keeping wraps on all week. Using silver alginate. She has wounds on the right lateral calf left medial and left lateral calf. All of this is somewhat better 12/16/17; the patient has 3 small open areas. One on the right lateral calf, one on the left medial calf and the small area on the left lateral calf. She has lymphedema, chronic venous insufficiency with hemosiderin deposition. Very fibrotic distal lower extremity scan with suggestion of an inverted bottle sign appearance to her legs. She has compression pumps but she does not use them has not used them in the last week. Currently we have her in 4 layer compression, silver alginate to the wounds and this is being changed once by home health. She is noncompliant with the compression pumps because she says it hurts her knees. She also has compression stockings ando Juxta light stockings at home 12/24/17; the patient's right lateral calf is healed as well as the left medial. There is no open wound on the right leg. She still has 2 areas on the left lateral calf. We transitioned the right leg into an extremitease stocking which she brought in from home. 12/31/17; the patient has a superficial wound on the left lateral lower calf. We've been wrapping this leg. She arrives today with the right leg swollen but without a wound. She cannot get her extremities stocking on the right leg. There is a lot more edema here. She is not using her compression pumps 01/10/2018; patient has a same superficial wound on the left lateral calf. Her right leg has less peripheral edema. She tells me she also started herself on some Lasix that had been previously prescribed for her at some point but she had never taken it. She is also concerned about whether her  compression pumps are leaking. She has extremitease stockings however the edema gets bad enough she can get these on. Currently she has no open wound on the right leg and is mention the edema is actually better than when I saw this 10 days ago 01/17/2018; patient has a same superficial area on the posterior left calf and a weeping area in the mid part of the right lateral calf. She still does not have anyone from [facility] coming to see her compression pumps which she describes as being suspicious for a leakage. I am not really sure how frequently she is using this in any case. She has massive bilateral calf lymphedema. I think there is some spreading lymphedema into her posterior thighs. 01/24/2018; the patient's edema in her legs is actually some better. She still has a smaller area on the  left posterior calf although this is better. The weeping area on the right lateral calf is also better. As it turned out she did not get her pumps from [facility] but [facility] and they are going to try to go out and see these this week which is going to help. I explained to her that she will use the compression pumps once a day while we are wrapping her legs but when she transitions to stockings that may be she will require pumping twice a day. Her compliance in this area has not been high and I wonder about her ability to do this herself. 02/01/2018; the area on the posterior left leg is healed. She still has the original wound on the right lateral calf which is epithelialized. The however just above this there is still weeping lymphedema fluid. She has a new area on the upper right calf which is either wrap injury or is she points out where she has been scratching under the wraps. We still not have managed to get a hold of a representative of [facility] to see if the pump is operational. She has extremitease stockings at home however I am not of the opinion that this will  maintain her skin integrity without the compression pumps. 02/08/2018; posterior left leg remains healed although she has a new small open area on the left lateral calf. Right lateral calf has two quarter sized open areas and close juxtaposition. There is less weeping edema fluid. She apparently managed to contact [facility]. They are sending her a part. But she is still not received it 02/15/2018; the patient has no open wound on either leg. Some scale present on the right upper lateral calf just below the fibular head. Edema control is excellent. She did not bring her stockings or her juxta lite wrap around. She has the part for her compression pump but she has not use the compression pumps yet READMISSION 01/10/2019 Patient is now a 73 year old woman that we have had in the clinic multiple times in the past. She has chronic lower extremity lymphedema, recurrent wounds on her bilateral lower extremities. She is also a type II diabetic reasonably well controlled with a recent hemoglobin A1c while she was in the hospital of 5.9. She has wounds on her bilateral lower extremities mostly on the left lateral and right lateral. These were noted also during her recent hospitalization. The patient states they have been there for a while. She has not been able to wear her stockings he has trouble getting them on as she lives alone. She also has external compression pumps but uses them sparingly because it causes pain in her knees which hurt because of osteoarthritis. The patient was on doxycycline before she went into the hospital apparently for fear of infection in her legs and is on Keflex coming out because of a UTI The patient was recently in the hospital from 01/06/2019 through 01/09/2019. She was admitted with nausea and vomiting and prerenal azotemia. This was corrected with IV fluid her Lasix has been put on hold. Hemoglobin A1c at 5.9 her metformin was discontinued. She was also felt to have  a UTI she was prescribed Keflex at 503 times a day she is picking that up today. The patient is not felt to have an arterial issue. ABIs in our clinic were 1.24 on the right and 1.06 on the left. She had formal studies in 2018 at which time her ABIs were 1.21 on the right TBI of  0.76 and on the left 1.35 and 0.75 respectively. Waveforms are on the right were monophasic and biphasic, triphasic and biphasic on the left. She is tolerated 4 layer compression in the past 10/13. Comes in today having not used her compression pumps /perhaps once in the last week. She has too much edema to consider healing these wounds. We have been using silver alginate 10/20; she tells me he had she used her compression pumps once a day as we asked. Clearly she has edema out of her legs although most of it seems to be settling around her knees which is what she complains about. We have been using silver alginate to the extensive de-epithelialized area on the left lateral calf and a much smaller area on the right lateral extending linearly into the right posterior 10/27; she is using her compression pumps daily. Her edema control is better. We have been using silver alginate to the extensive wound areas on her bilateral lower legs. This includes left anterior left lateral and left posterior. Right anterior right lateral and right posterior. The wounds are not all in the same condition. She has severe bilateral skin damage with the epithelialization, flaking dried epithelium 11/3; she is using her compression pumps 1 times daily. Pretty obvious that her wraps fell down especially on the left leg to mid calf. I think things are improving on the right lateral calf, I do not see anything open posteriorly. The major areas on the left lateral where she has 3 or 4 deeper wounds in the midst of an entire de-epithelialized area. There is erythema here but no tenderness I think this represents venous stasis rather than cellulitis.  I debrided the areas on the left lateral calf last week but once again they have tightly adherent debris over the top of them which is disappointing 11/10; the patient's major areas on the left lateral calf to 3 wounds with some depth surrounded by a large area of de-epithelialized tissue. There is no infection. We have been using silver alginate under 4-layer compression. On the right lateral she has a more contained wound area that is superficial. Been using silver alginate under 4-layer compression 11/17; the patient's major wound is on the left lateral calf. We have been using silver alginate under compression. There is no open wound per se on the right but it de-epithelialized area on the lateral calf that is weeping edema fluid. I do not see much change here today. 12/1; patient arrives in clinic with her wounds looking better. Her edema control is better. The lymphedema specialist working on the left leg we have been working on the right. We have been using 4-layer compression. The patient is using a consider external compression pumps and Hydrofera Blue to the open areas. She is being measured for an abdominal compression pump and apparently that is going to go through her insurance 12/15; continued improvement. 2 small areas on the left and slightly larger 2 small areas on the right. Objective Constitutional Patient is hypertensive.. Pulse regular and within target range for patient.Marland Kitchen Respirations regular, non-labored and within target range.. Temperature is normal and within the target range for the patient.Marland Kitchen Appears in no distress. Vitals Time Taken: 1:58 PM, Height: 71 in, Weight: 240 lbs, BMI: 33.5, Temperature: 97.6 F, Pulse: 59 bpm, Respiratory Rate: 18 breaths/min, Blood Pressure: 150/58 mmHg. Eyes Conjunctivae clear. No discharge.no icterus. Respiratory work of breathing is normal. Cardiovascular Pedal pulses are easily palpable. We have very good edema control  bilaterally.Marland Kitchen Psychiatric appears  at normal baseline. General Notes: Wound exam ooOn the left lateral the substantial wound has closed. ooOn the right posterior and lateral the we still have open areas although they look reasonably healthy ooLeft 2 small areas 1 superior and lateral and one anterior. Integumentary (Hair, Skin) Changes of chronic venous insufficiency and lymphedema. Wound #28 status is Open. Original cause of wound was Gradually Appeared. The wound is located on the Right,Lateral Lower Leg. The wound measures 1cm length x 1cm width x 0.1cm depth; 0.785cm^2 area and 0.079cm^3 volume. There is Fat Layer (Subcutaneous Tissue) Exposed exposed. There is no tunneling or undermining noted. There is a small amount of serous drainage noted. The wound margin is indistinct and nonvisible. There is large (67-100%) pink granulation within the wound bed. There is no necrotic tissue within the wound bed. Wound #31 status is Open. Original cause of wound was Gradually Appeared. The wound is located on the Right,Distal,Posterior Lower Leg. The wound measures 1cm length x 0.8cm width x 0.1cm depth; 0.628cm^2 area and 0.063cm^3 volume. There is Fat Layer (Subcutaneous Tissue) Exposed exposed. There is no tunneling or undermining noted. There is a small amount of serous drainage noted. The wound margin is flat and intact. There is large (67-100%) pink granulation within the wound bed. There is no necrotic tissue within the wound bed. Wound #32 status is Open. Original cause of wound was Gradually Appeared. The wound is located on the Left,Anterior Lower Leg. The wound measures 0.3cm length x 0.2cm width x 0.1cm depth; 0.047cm^2 area and 0.005cm^3 volume. There is Fat Layer (Subcutaneous Tissue) Exposed exposed. There is no tunneling or undermining noted. There is a small amount of serous drainage noted. The wound margin is flat and intact. There is large (67-100%) pink granulation within the  wound bed. There is no necrotic tissue within the wound bed. Wound #33 status is Open. Original cause of wound was Gradually Appeared. The wound is located on the Left,Proximal,Lateral Lower Leg. The wound measures 0.2cm length x 0.2cm width x 0.1cm depth; 0.031cm^2 area and 0.003cm^3 volume. There is Fat Layer (Subcutaneous Tissue) Exposed exposed. There is no tunneling or undermining noted. There is a small amount of serous drainage noted. The wound margin is flat and intact. There is large (67-100%) pink granulation within the wound bed. There is no necrotic tissue within the wound bed. 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 There was a Four Layer Compression Therapy Procedure by Carlene Coria, RN. Post procedure Diagnosis Wound #28: Same as Pre-Procedure Wound #31 Pre-procedure diagnosis of Wound #31 is a Lymphedema located on the Right,Distal,Posterior Lower Leg . There was a Four Layer Compression Therapy Procedure by Carlene Coria, RN. Post procedure Diagnosis Wound #31: Same as Pre-Procedure Plan Follow-up Appointments: Return Appointment in 2 weeks. Nurse Visit: - 1 week Skin Barriers/Peri-Wound Care: Barrier cream Moisturizing lotion TCA Cream or Ointment Wound Cleansing: May shower and wash wound with soap and water. Primary Wound Dressing: Wound #28 Right,Lateral Lower Leg: Hydrofera Blue - ready Wound #31 Right,Distal,Posterior Lower Leg: Hydrofera Blue - ready Wound #32 Left,Anterior Lower Leg: Hydrofera Blue - ready Wound #33 Left,Proximal,Lateral Lower Leg: Hydrofera Blue - ready Secondary Dressing: Wound #28 Right,Lateral Lower  Leg: ABD pad  Wound #31 Right,Distal,Posterior Lower Leg: ABD pad Wound #32 Left,Anterior Lower Leg: ABD pad Wound #33 Left,Proximal,Lateral Lower Leg: ABD pad Edema Control: 4 layer compression - Right Lower Extremity - unna boot at top to help secure wrap Other: - left leg: PT to apply lymphedema wraps 1. Continue with Hydrofera Blue under compression. Lymphedema wrap on the left and our 4-layer compression on the right. She is using her compression pumps at home. We are making really nice progress Electronic Signature(s) Signed: 03/21/2019 5:53:46 PM By: Linton Ham MD Entered By: Linton Ham on 03/21/2019 14:35:56 -------------------------------------------------------------------------------- SuperBill Details Patient Name: Date of Service: [name] [name], [name] [name] 03/21/2019 Medical Record [id number] Patient Account Number: [id number] Date of Birth/Sex: Treating RN: 01-15-1946 (73 y.o. [place] Lo, [place] Primary Care Provider: [name] Other Clinician: Referring Provider: Treating Provider/Extender:Cy Bresee, Tenna Child, [place] Si[place] in Treatment: 10 Diagnosis Coding ICD-10 Codes Code Description I89.0 Lymphedema, not elsewhere classified L97.221 Non-pressure chronic ulcer of left calf limited to breakdown of skin L97.211 Non-pressure chronic ulcer of right calf limited to breakdown of skin E11.622 Type 2 diabetes mellitus with other skin ulcer Facility Procedures Physician Procedures CPT4 Code Description: [id number] - WC PHYS LEVEL 3 - EST PT ICD-10 Diagnosis Description L97.221 Non-pressure chronic ulcer of left calf limited to br L97.211 Non-pressure chronic ulcer of right calf limited to b I89.0 Lymphedema, not elsewhere  classified Modifier: eakdown of skin reakdown of skin Quantity: 1 Electronic Signature(s) Signed: 03/21/2019 5:53:46 PM By: Linton Ham MD Entered By: Linton Ham on 03/21/2019 14:36:24

## 2019-03-23 ENCOUNTER — Ambulatory Visit: Payer: PPO

## 2019-03-23 ENCOUNTER — Other Ambulatory Visit: Payer: Self-pay

## 2019-03-23 DIAGNOSIS — I890 Lymphedema, not elsewhere classified: Secondary | ICD-10-CM | POA: Diagnosis not present

## 2019-03-23 DIAGNOSIS — R2689 Other abnormalities of gait and mobility: Secondary | ICD-10-CM

## 2019-03-23 NOTE — Progress Notes (Signed)
[name] [name], [name] [name] ([id number]) Visit Report for 03/21/2019 Arrival Information Details Patient Name: Date of Service: [name] [name], [name] [name] 03/21/2019 1:00 PM Medical Record [id number] Patient Account Number: [id number] Date of Birth/Sex: Treating RN: 1945-08-09 (73 y.o. [name] [name] Primary Care Mineola Duan: [name] Other Clinician: Referring Stefany Starace: Treating Ellwood Steidle/Extender:Robson, Tenna Child, [place] Siren in Treatment: 10 Visit Information History Since Last Visit Walker Added or deleted any medications: No Patient Arrived: Any new allergies or adverse reactions: No Arrival Time: 13:45 Had a fall or experienced change in No Accompanied By: alone activities of daily living that may affect Transfer Assistance: None risk of falls: Patient Identification Verified: Yes Signs or symptoms of abuse/neglect since last No Secondary Verification Process Completed: Yes visito Patient Requires Transmission-Based No Hospitalized since last visit: No Precautions: Implantable device outside of the clinic excluding No Patient Has Alerts: No cellular tissue based products placed in the center since last visit: Has Dressing in Place as Prescribed: Yes Has Compression in Place as Prescribed: Yes Pain Present Now: No Electronic Signature(s) Signed: 03/23/2019 5:32:06 PM By: Levan Hurst RN, BSN Entered By: Levan Hurst on 03/21/2019 14:06:55 -------------------------------------------------------------------------------- Compression Therapy Details Patient Name: Date of Service: [name] [name], [name] [name] 03/21/2019 1:00 PM Medical Record [id number] Patient Account Number: [id number] Date of Birth/Sex: Treating RN: 1945-09-25 (72 y.o. [name] [name] Primary Care Krikor Willet: [name] Other Clinician: Referring Alik Mawson: Treating Todd Jelinski/Extender:Robson, Tenna Child, [place] Siren in Treatment: 10 Compression Therapy Performed for Wound  Wound #28 Right,Lateral Lower Leg Assessment: Performed By: Clinician Carlene Coria, RN Compression Type: Four Layer Post Procedure Diagnosis Same as Pre-procedure Electronic Signature(s) Signed: 03/22/2019 9:49:12 AM By: Carlene Coria RN Entered By: Carlene Coria on 03/21/2019 14:21:00 -------------------------------------------------------------------------------- Compression Therapy Details Patient Name: Date of Service: [name], [name] 03/21/2019 1:00 PM Medical Record [id number] Patient Account Number: [id number] Date of Birth/Sex: Treating RN: 01/26/1946 (73 y.o. [name] [name] Primary Care Shaheed Schmuck: [name] Other Clinician: Referring Daeshaun Specht: Treating Ethelmae Ringel/Extender:Robson, Tenna Child, [place] Siren in Treatment: 10 Compression Therapy Performed for Wound Wound #31 Right,Distal,Posterior Lower Leg Assessment: Performed By: Clinician Carlene Coria, RN Compression Type: Four Layer Post Procedure Diagnosis Same as Pre-procedure Electronic Signature(s) Signed: 03/22/2019 9:49:12 AM By: Carlene Coria RN Entered By: Carlene Coria on 03/21/2019 14:21:00 -------------------------------------------------------------------------------- Encounter Discharge Information Details Patient Name: Date of Service: [name] [name], [name] [name] 03/21/2019 1:00 PM Medical Record [id number] Patient Account Number: [id number] Date of Birth/Sex: Treating RN: 04/07/1945 (73 y.o. [name] [name] Primary Care Endre Coutts: [name] Other Clinician: Referring Sayed Apostol: Treating Lera Gaines/Extender:Robson, Tenna Child, [place] Siren in Treatment: 10 Encounter Discharge Information Items Discharge Condition: Stable Ambulatory Status: Walker Discharge Destination: Home Transportation: Other Accompanied By: self Schedule Follow-up Appointment: Yes Clinical Summary of Care: Patient Declined Electronic Signature(s) Signed: 03/23/2019 5:33:40 PM By:  Kela Millin Entered By: Kela Millin on 03/21/2019 14:46:38 -------------------------------------------------------------------------------- Lower Extremity Assessment Details Patient Name: Date of Service: [name] [name], [name] [name] 03/21/2019 1:00 PM Medical Record [id number] Patient Account Number: [id number] Date of Birth/Sex: Treating RN: Feb 15, 1946 (73 y.o. [name] [name] Primary Care Emelina Hinch: [name] Other Clinician: Referring Quillan Whitter: Treating Valeda Corzine/Extender:Robson, Tenna Child, [place] Siren in Treatment: 10 Edema Assessment Assessed: [Left: No] [Right: No] Edema: [Left: Yes] [Right: Yes] Calf Left: Right: Point of Measurement: 30 cm From Medial Instep 51.4 cm 54 cm Ankle Left: Right: Point of Measurement: 8 cm From Medial Instep 21.5 cm 27 cm Vascular Assessment Pulses: Dorsalis Pedis Palpable: [Left:Yes] [Right:Yes] Electronic Signature(s) Signed: 03/23/2019 5:32:06 PM By: Levan Hurst RN, BSN Entered By: Levan Hurst on 03/21/2019 14:08:04 -------------------------------------------------------------------------------- Multi Wound Chart  Details Patient Name: Date of Service: [name] [name], [name] [name] 03/21/2019 1:00 PM Medical Record [id number] Patient Account Number: [id number] Date of Birth/Sex: Treating RN: [birth year] (73 y.o. F) Primary Care Ayman Brull: [name] Other Clinician: Referring Olson Lucarelli: Treating Meghna Hagmann/Extender:Robson, Tenna Child, [place] Siren in Treatment: 10 Vital Signs Height(in): 66 Pulse(bpm): 37 Weight(lbs): 240 Blood Pressure(mmHg): 150/58 Body Mass Index(BMI): 33 Temperature(F): 97.6 Respiratory 18 Rate(breaths/min): Photos: [28:No Photos] [31:No Photos] [32:No Photos] Wound Location: [28:Right Lower Leg - Lateral] [31:Right Lower Leg - Posterior, Left Lower Leg - Anterior Distal] Wounding Event: [28:Gradually Appeared] [31:Gradually Appeared] [32:Gradually  Appeared] Primary Etiology: [28:Lymphedema] [31:Lymphedema] [32:Lymphedema] 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] [31:Lower Extremity] [32:Lower Extremity] Comorbid History: [28:Cataracts, Lymphedema, Cataracts, Lymphedema, Cataracts, Lymphedema, Arrhythmia, Congestive Heart Failure, Hypertension, Heart Failure, Hypertension, Heart Failure, Hypertension, Peripheral Venous Disease, Peripheral Venous Disease,  Peripheral Venous Disease, Type II Diabetes, Osteoarthritis, Neuropathy, Osteoarthritis, Neuropathy, Osteoarthritis, Neuropathy, Confinement Anxiety] [31:Arrhythmia, Congestive Type II Diabetes, Confinement Anxiety] [32:Arrhythmia, Congestive Type II  Diabetes, Confinement Anxiety] Date Acquired: [28:08/05/2018] [31:08/05/2018] [32:08/05/2018] Weeks of Treatment: [28:10] [31:10] [32:10] Wound Status: [28:Open] [31:Open] [32:Open] Measurements L x W x D 1x1x0.1 [31:1x0.8x0.1] [32:0.3x0.2x0.1] (cm) Area (cm) : [28:0.785] [31:0.628] [32:0.047] Volume (cm) : [28:0.079] [31:0.063] [32:0.005] % Reduction in Area: [28:97.80%] [31:38.50%] [32:96.40%] % Reduction in Volume: 97.80% [31:38.20%] [32:96.20%] Classification: [28:Full Thickness Without Exposed Support Structures Exposed Support Structures Exposed Support Structures] [31:Full Thickness Without] [32:Full Thickness Without] Exudate Amount: [28:Small] [31:Small] [32:Small] Exudate Type: [28:Serous] [31:Serous] [32:Serous] Exudate Color: [28:amber] [31:amber] [32:amber] Wound Margin: [28:Indistinct, nonvisible] [31:Flat and Intact] [32:Flat and Intact] Granulation Amount: [28:Large (67-100%)] [31:Large (67-100%)] [32:Large (67-100%)] Granulation Quality: [28:Pink] [31:Pink] [32:Pink] Necrotic Amount: [28:None Present (0%)] [31:None Present (0%)] [32:None Present (0%)] Exposed Structures: [28:Fat Layer (Subcutaneous Fat Layer (Subcutaneous Fat Layer  (Subcutaneous Tissue) Exposed: Yes Fascia: No Tendon: No Muscle: No Joint: No Bone: No] [31:Tissue) Exposed: Yes Fascia: No Tendon: No Muscle: No Joint: No Bone: No] [32:Tissue) Exposed: Yes  Fascia: No Tendon: No Muscle: No Joint: No Bone: No] Epithelialization: [28:Medium (34-66%)] [31:Medium (34-66%)] [32:Large (67-100%)] Procedures Performed: Compression Therapy [31:Compression Therapy 33] [32:N/A N/A N/A] Photos: [28:No Photos] [31:N/A] [32:N/A] Wound Location: [28:Left Lower Leg - Lateral, Proximal] [31:N/A] [32:N/A] Wounding Event: [28:Gradually Appeared] [31:N/A] [32:N/A] Primary Etiology: [28:Lymphedema] [31:N/A] [32:N/A] Secondary Etiology: [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] [32:N/A] Comorbid History: [28:Cataracts, Lymphedema, N/A Arrhythmia, Congestive Heart Failure, Hypertension, Peripheral Venous Disease, Type II Diabetes, Osteoarthritis, Neuropathy, Confinement Anxiety] [32:N/A] Date Acquired: [28:08/05/2018] [31:N/A] [32:N/A] Weeks of Treatment: [28:10] [31:N/A] [32:N/A] Wound Status: [28:Open] [31:N/A] [32:N/A] Measurements L x W x D 0.2x0.2x0.1 [31:N/A] [32:N/A] (cm) Area (cm) : [28:0.031] [31:N/A] [32:N/A] Volume (cm) : [id number] [31:N/A] [32:N/A] % Reduction in Area: [28:99.90%] [31:N/A] [32:N/A] % Reduction in Volume: 99.90% [31:N/A] [32:N/A] Classification: [28:Full Thickness Without Exposed Support Structures] [31:N/A] [32:N/A] Exudate Amount: [28:Small] [31:N/A] [32:N/A] Exudate Type: [28:Serous] [31:N/A] [32:N/A] Exudate Color: [28:amber] [31:N/A] [32:N/A] Wound Margin: [28:Flat and Intact] [31:N/A] [32:N/A] Granulation Amount: [28:Large (67-100%)] [31:N/A] [32:N/A] Granulation Quality: [28:Pink] [31:N/A] [32:N/A] Necrotic Amount: [28:None Present (0%)] [31:N/A] [32:N/A] Exposed Structures: [28:Fat Layer (Subcutaneous N/A Tissue) Exposed: Yes Fascia: No Tendon: No Muscle: No Joint: No Bone: No] [32:N/A] Epithelialization: [28:Large (67-100%)]  [31:N/A N/A] [32:N/A N/A] Treatment Notes Electronic Signature(s) Signed: 03/21/2019 5:53:46 PM By: Linton Ham MD Entered By: Linton Ham on 03/21/2019 14:28:37 -------------------------------------------------------------------------------- Multi-Disciplinary Care Plan Details Patient Name: Date of Service: [name] [name], [name] [name] 03/21/2019 1:00 PM Medical Record [id number] Patient Account Number: [id number] Date of Birth/Sex: Treating RN:  [birth year] (73 y.o. [name] [name] Primary Care Royetta Probus: [name] Other Clinician: Referring Willow Reczek: Treating Tammi Boulier/Extender:Robson, Tenna Child, [place] Siren in Treatment: 10 Active Inactive Wound/Skin Impairment Nursing Diagnoses: Knowledge deficit related to ulceration/compromised skin integrity Goals: Patient/caregiver will verbalize understanding of skin care regimen Date Initiated: 01/10/2019 Target Resolution Date: 04/14/2019 Goal Status: Active Ulcer/skin breakdown will have a volume reduction of 30% by week 4 Target Resolution Date Initiated: 01/10/2019 Date Inactivated: 02/14/2019 Date: 02/10/2019 Unmet Goal Status: Unmet Reason: comorbities/lymphedema Ulcer/skin breakdown will have a volume reduction of 50% by week 8 Target Resolution Date Initiated: 02/14/2019 Date Inactivated: 03/21/2019 Date: 03/17/2019 Unmet Goal Status: Unmet Reason: comorbities/lymphedema Ulcer/skin breakdown will have a volume reduction of 80% by week 12 Date Initiated: 03/21/2019 Target Resolution Date: 04/21/2019 Goal Status: Active Interventions: Assess patient/caregiver ability to obtain necessary supplies Assess patient/caregiver ability to perform ulcer/skin care regimen upon admission and as needed Assess ulceration(s) every visit Notes: Electronic Signature(s) Signed: 03/22/2019 9:49:12 AM By: Carlene Coria RN Entered By: Carlene Coria on 03/21/2019  13:26:18 -------------------------------------------------------------------------------- Pain Assessment Details Patient Name: Date of Service: [name] [name], [name] [name] 03/21/2019 1:00 PM Medical Record [id number] Patient Account Number: [id number] Date of Birth/Sex: Treating RN: 14-Apr-1945 (73 y.o. [name] [name] Primary Care Jesslynn Kruck: [name] Other Clinician: Referring Likisha Alles: Treating Benedict Kue/Extender:Robson, Tenna Child, [place] Siren in Treatment: 10 Active Problems Location of Pain Severity and Description of Pain Patient Has Paino No Site Locations Pain Management and Medication Current Pain Management: Electronic Signature(s) Signed: 03/23/2019 5:32:06 PM By: Levan Hurst RN, BSN Entered By: Levan Hurst on 03/21/2019 14:07:26 -------------------------------------------------------------------------------- Patient/Caregiver Education Details Patient Name: Date of Service: [name] [name], [name] [name] 12/15/2020andnbsp1:00 PM Medical Record [id number] Patient Account Number: [id number] Date of Birth/Gender: Treating RN: 1946/03/13 (73 y.o. [name] [name] Primary Care Physician: [name] Other Clinician: Referring Physician: Treating Physician/Extender:Robson, Tenna Child, [place] Siren in Treatment: 10 Education Assessment Education Provided To: Patient Education Topics Provided Malignant/Atypical Wounds: Methods: Explain/Verbal Responses: State content correctly Electronic Signature(s) Signed: 03/22/2019 9:49:12 AM By: Carlene Coria RN Entered By: Carlene Coria on 03/21/2019 13:26:35 -------------------------------------------------------------------------------- Wound Assessment Details Patient Name: Date of Service: [name] [name], [name] [name] 03/21/2019 1:00 PM Medical Record [id number] Patient Account Number: [id number] Date of Birth/Sex: Treating RN: [birth year] (73 y.o. [name] [name] Primary Care Verle Wheeling: [name] Other Clinician: Referring Aaden Buckman: Treating Minor Iden/Extender:Robson, Tenna Child, [place] Siren in Treatment: 10 Wound Status Wound Number: 28 Primary Lymphedema Etiology: Wound Location: Right Lower Leg - Lateral Secondary Diabetic Wound/Ulcer of the Lower Extremity Wounding Event: Gradually Appeared Etiology: Date Acquired: 08/05/2018 Wound Open Weeks Of Treatment: 10 Status: Clustered Wound: No Comorbid Cataracts, Lymphedema, Arrhythmia, History: Congestive Heart Failure, Hypertension, Peripheral Venous Disease, Type II Diabetes, Osteoarthritis, Neuropathy, Confinement Anxiety Photos Wound Measurements Length: (cm) 1 % Reduc Width: (cm) 1 % Reduc Depth: (cm) 0.1 Epithel Area: (cm) 0.785 Tunnel Volume: (cm) 0.079 Underm Wound Description Classification: Full Thickness Without Exposed Support Foul Od Structures Slough/ Wound Indistinct, nonvisible Margin: Exudate Small Amount: Exudate Serous Type: Exudate amber Color: Wound Bed Granulation Amount: Large (67-100%) Granulation Quality: Pink Fascia Necrotic Amount: None Present (0%) Fat La Tendon Muscle Joint Bone E or After Cleansing: No Fibrino No Exposed Structure Exposed: No yer (Subcutaneous Tissue) Exposed: Yes Exposed: No Exposed: No Exposed: No xposed: No tion in Area: 97.8% tion in Volume: 97.8% ialization: Medium (34-66%) ing: No ining: No Treatment Notes Wound #28 (Right, Lateral Lower Leg) 1. Cleanse With Wound Cleanser Soap and water 2. Periwound Care TCA Cream 3. Primary Dressing Applied Hydrofera Blue 4.  Secondary Dressing ABD Pad Dry Gauze 6. Support Layer Applied 4 layer compression wrap Notes unna boot to top of right leg. wound team applied dressing/wrap to right leg and lymphedema specialists to apply dressing and wraps to left leg Electronic Signature(s) Signed: 03/23/2019 3:39:50 PM By: Mikeal Hawthorne EMT/HBOT Signed: 03/23/2019 5:32:06 PM By:  Levan Hurst RN, BSN Entered By: Mikeal Hawthorne on 03/23/2019 14:43:25 -------------------------------------------------------------------------------- Wound Assessment Details Patient Name: Date of Service: [name] [name], [name] [name] 03/21/2019 1:00 PM Medical Record [id number] Patient Account Number: [id number] Date of Birth/Sex: Treating RN: [birth year] (73 y.o. [name] [name] Primary Care Trenell Concannon: [name] Other Clinician: Referring Lakeem Rozo: Treating Rosamund Nyland/Extender:Robson, Tenna Child, [place] Siren in Treatment: 10 Wound Status Wound Number: 31 Primary Lymphedema Etiology: Wound Location: Right Lower Leg - Posterior, Distal Secondary Diabetic Wound/Ulcer of the Lower Extremity Wounding Event: Gradually Appeared Etiology: Date Acquired: 08/05/2018 Wound Open Weeks Of Treatment: 10 Status: Clustered Wound: No Comorbid Cataracts, Lymphedema, Arrhythmia, History: Congestive Heart Failure, Hypertension, Peripheral Venous Disease, Type II Diabetes, Osteoarthritis, Neuropathy, Confinement Anxiety Photos Wound Measurements Length: (cm) 1 % Redu Width: (cm) 0.8 % Redu Depth: (cm) 0.1 Epithe Area: (cm) 0.628 Tunne Volume: (cm) 0.063 Under Wound Description Full Thickness Without Exposed Support Classification: Structures Wound Flat and Intact Margin: Exudate Small Amount: Exudate Serous Type: Exudate amber Color: Wound Bed Granulation Amount: Large (67-100%) Granulation Quality: Pink Necrotic Amount: None Present (0%) Foul Odor After Cleansing: No Slough/Fibrino No Exposed Structure Fascia Exposed: No Fat Layer (Subcutaneous Tissue) Exposed: Yes Tendon Exposed: No Muscle Exposed: No Joint Exposed: No Bone Exposed: No ction in Area: 38.5% ction in Volume: 38.2% lialization: Medium (34-66%) ling: No mining: No Treatment Notes Wound #31 (Right, Distal, Posterior Lower Leg) 1. Cleanse With Wound Cleanser Soap and water 2.  Periwound Care TCA Cream 3. Primary Dressing Applied Hydrofera Blue 4. Secondary Dressing ABD Pad Dry Gauze 6. Support Layer Applied 4 layer compression wrap Notes unna boot to top of right leg. wound team applied dressing/wrap to right leg and lymphedema specialists to apply dressing and wraps to left leg Electronic Signature(s) Signed: 03/23/2019 3:39:50 PM By: Mikeal Hawthorne EMT/HBOT Signed: 03/23/2019 5:32:06 PM By: Levan Hurst RN, BSN Entered By: Mikeal Hawthorne on 03/23/2019 14:44:03 -------------------------------------------------------------------------------- Wound Assessment Details Patient Name: Date of Service: [name] [name], [name] [name] 03/21/2019 1:00 PM Medical Record [id number] Patient Account Number: [id number] Date of Birth/Sex: Treating RN: 1945/09/29 (73 y.o. [name] [name] Primary Care Jaasiel Hollyfield: [name] Other Clinician: Referring Malikah Lakey: Treating Keyshon Stein/Extender:Robson, Tenna Child, [place] Siren in Treatment: 10 Wound Status Wound Number: 32 Primary Lymphedema Etiology: Wound Location: Left Lower Leg - Anterior Secondary Diabetic Wound/Ulcer of the Lower Extremity Wounding Event: Gradually Appeared Etiology: Date Acquired: 08/05/2018 Wound Open Weeks Of Treatment: 10 Status: Clustered Wound: No Comorbid Cataracts, Lymphedema, Arrhythmia, History: Congestive Heart Failure, Hypertension, Peripheral Venous Disease, Type II Diabetes, Osteoarthritis, Neuropathy, Confinement Anxiety Photos Wound Measurements Length: (cm) 0.3 % Reduc Width: (cm) 0.2 % Reduc Depth: (cm) 0.1 Epithel Area: (cm) 0.047 Tunnel Volume: (cm) 0.005 Underm Wound Description Classification: Full Thickness Without Exposed Support Foul Od Structures Slough/ Wound Flat and Intact Margin: Exudate Small Amount: Exudate Serous Type: Exudate amber amber Color: Wound Bed Granulation Amount: Large (67-100%) Granulation Quality: Pink  Fascia Necrotic Amount: None Present (0%) Fat La Tendon Muscle Joint Bone E or After Cleansing: No Fibrino Yes Exposed Structure Exposed: No yer (Subcutaneous Tissue) Exposed: Yes Exposed: No Exposed: No Exposed: No xposed: No tion in Area: 96.4% tion in Volume: 96.2% ialization: Large (67-100%) ing: No  ining: No Treatment Notes Wound #32 (Left, Anterior Lower Leg) 1. Cleanse With Wound Cleanser Soap and water 2. Periwound Care TCA Cream 3. Primary Dressing Applied Hydrofera Blue 4. Secondary Dressing ABD Pad Dry Gauze 6. Support Layer Applied 4 layer compression wrap Notes unna boot to top of right leg. wound team applied dressing/wrap to right leg and lymphedema specialists to apply dressing and wraps to left leg Electronic Signature(s) Signed: 03/23/2019 3:39:50 PM By: Mikeal Hawthorne EMT/HBOT Signed: 03/23/2019 5:32:06 PM By: Levan Hurst RN, BSN Entered By: Mikeal Hawthorne on 03/23/2019 14:44:26 -------------------------------------------------------------------------------- Wound Assessment Details Patient Name: Date of Service: [name] [name], [name] [name] 03/21/2019 1:00 PM Medical Record [id number] Patient Account Number: [id number] Date of Birth/Sex: Treating RN: 07-27-1945 (74 y.o. [name] [name] Primary Care Rigby Leonhardt: [name] Other Clinician: Referring Sutton Hirsch: Treating Karinna Beadles/Extender:Robson, Tenna Child, [place] Siren in Treatment: 10 Wound Status Wound Number: 33 Primary Lymphedema Etiology: Wound Location: Left Lower Leg - Lateral, Proximal Secondary Diabetic Wound/Ulcer of the Lower Extremity Wounding Event: Gradually Appeared Etiology: Date Acquired: 08/05/2018 Wound Open Weeks Of Treatment: 10 Status: Clustered Wound: No Comorbid Cataracts, Lymphedema, Arrhythmia, History: Congestive Heart Failure, Hypertension, Peripheral Venous Disease, Type II Diabetes, Osteoarthritis, Neuropathy,  Confinement Anxiety Photos Wound Measurements Length: (cm) 0.2 % Reduct Width: (cm) 0.2 % Reduct Depth: (cm) 0.1 Epitheli Area: (cm) 0.031 Tunneli Volume: (cm) 0.003 Undermi Wound Description Classification: Full Thickness Without Exposed Support Foul Odo Structures Slough/F Wound Flat and Intact Margin: Exudate Small Amount: Exudate Serous Type: Exudate amber Color: Wound Bed Granulation Amount: Large (67-100%) Granulation Quality: Pink Fascia E Necrotic Amount: None Present (0%) Fat Laye Tendon E Muscle E Joint Ex Bone Exp r After Cleansing: No ibrino No Exposed Structure xposed: No r (Subcutaneous Tissue) Exposed: Yes xposed: No xposed: No posed: No osed: No ion in Area: 99.9% ion in Volume: 99.9% alization: Large (67-100%) ng: No ning: No Treatment Notes Wound #33 (Left, Proximal, Lateral Lower Leg) 1. Cleanse With Wound Cleanser Soap and water 2. Periwound Care TCA Cream 3. Primary Dressing Applied Hydrofera Blue 4. Secondary Dressing ABD Pad Dry Gauze 6. Support Layer Applied 4 layer compression wrap Notes unna boot to top of right leg. wound team applied dressing/wrap to right leg and lymphedema specialists to apply dressing and wraps to left leg Electronic Signature(s) Signed: 03/23/2019 3:39:50 PM By: Mikeal Hawthorne EMT/HBOT Signed: 03/23/2019 5:32:06 PM By: Levan Hurst RN, BSN Entered By: Mikeal Hawthorne on 03/23/2019 14:44:46 -------------------------------------------------------------------------------- Vitals Details Patient Name: Date of Service: Penaloza, Kimble 03/21/2019 1:00 PM Medical Record [id number] Patient Account Number: [id number] Date of Birth/Sex: Treating RN: Oct 25, 1945 (73 y.o. [name] [name] Primary Care Ariela Mochizuki: [name] Other Clinician: Referring Camree Wigington: Treating Brittian Renaldo/Extender:Robson, Tenna Child, [place] Siren in Treatment: 10 Vital Signs Time Taken: 13:58 Temperature  (F): 97.6 Height (in): 71 Pulse (bpm): 59 Weight (lbs): 240 Respiratory Rate (breaths/min): 18 Body Mass Index (BMI): 33.5 Blood Pressure (mmHg): 150/58 Reference Range: 80 - 120 mg / dl Electronic Signature(s) Signed: 03/23/2019 5:32:06 PM By: Levan Hurst RN, BSN Entered By: Levan Hurst on 03/21/2019 14:07:20

## 2019-03-23 NOTE — Therapy (Signed)
[place], [place], [address] Phone: [phone]   Fax:  [phone]  Physical Therapy Treatment  Patient Details  Name: [name] [name] MRN: [id number] Date of Birth: 01/02/1946 Referring Provider (PT): Darci Current   Encounter Date: 03/23/2019  PT End of Session - 03/23/19 1513    Visit Number  8    Number of Visits  25    Date for PT Re-Evaluation  03/28/19    PT Start Time  [id number]    PT Stop Time  1445    PT Time Calculation (min)  40 min    Activity Tolerance  Patient tolerated treatment well    Behavior During Therapy  [facility] for tasks assessed/performed       Past Medical History:  Diagnosis Date  . Acute kidney injury ([place])   . Aortic stenosis, mild 11/17/2013  . Arthritis    "both knees, spine, back, fingers" (11/29/2013)  . CHF (congestive heart failure) ([place])   . Edema   . GERD (gastroesophageal reflux disease)   . Heart murmur   . History of diastolic dysfunction    Echo [id number] with diastolic dysfunction  . HTN (hypertension)   . Morbid obesity ([place])   . OA (osteoarthritis)   . PAC (premature atrial contraction)    per prior Holter  . Palpitations   . Pneumonia    "as a child"  . PVC's (premature ventricular contractions)    per prior Holter  . SVT (supraventricular tachycardia) ([place])   . Tachycardia    noted at 07/28/11 visit. Started on beta blocker. Possible atrial flutter vs long PR tachycardia/AVNRT  . Type II diabetes mellitus ([place])     Past Surgical History:  Procedure Laterality Date  . CESAREAN SECTION  1985  . CESAREAN SECTION  1985  . SUPRAVENTRICULAR TACHYCARDIA ABLATION  11/29/2013  . SUPRAVENTRICULAR TACHYCARDIA ABLATION N/A 11/29/2013   Procedure: SUPRAVENTRICULAR TACHYCARDIA ABLATION;  Surgeon: Evans Lance, MD;  Location: [facility] CATH LAB;  Service: Cardiovascular;  Laterality: N/A;  . US ECHOCARDIOGRAPHY  07/25/2008   EF 55-60%  . VAGINAL DELIVERY       There were no vitals filed for this visit.  Subjective Assessment - 03/23/19 1508    Subjective  pt reports that she was able to keep her wrap on since her last visit. She states that she continud to wear her tubigrip over her L thigh and knee for light compression.    Pertinent History  osteo arthritis, hx cellulitis, phlebitis and history of edema in BLE, Hx Acute kidney injury, CHF and DM II    Patient Stated Goals  I want to get this swelling down and heal my wound.    Currently in Pain?  Yes    Pain Score  7     Pain Location  Knee    Pain Orientation  Right;Left    Pain Descriptors / Indicators  Aching    Pain Type  Chronic pain    Pain Onset  More than a month ago    Pain Frequency  Intermittent    Aggravating Factors   movement    Pain Relieving Factors  rest                       [facility] Adult PT Treatment/Exercise - 03/23/19 0001      Manual Therapy   Manual Therapy  Edema management;Compression Bandaging    Manual therapy comments  Pt had no drainage  this session    Edema Management  Pt will continue to wear tubigrip over her L thigh and knee. she will bring in her old velcro wraps next session.     Compression Bandaging  Pt was wrapped with 3 artiflex for shaping and 1 8 cm roman sandal, 1 10 cm for ankle sole heel and 2 cm spiraling up the lower leg and just above the knee. 1 size tubigrip was used for the lower leg and a larger tubigrip was provided for the knee and thigh. Tricofix was used to help smooth the transition between the different sizes and so that pt can remove the tubigrip over the thigh if needed. Smaller tubigrip was placed as a base layer and ABD pad at the L ankle from dorsum of foot to ankle for good transition. Toes were wrapped with 2 inch elastomull folded for digits 1-5.              PT Education - 03/23/19 1512    Education Details  Pt will continue to wear her tubigrip over her L knee and thigh as much as possible.  Re-iterated education on when to remove the wraps including pain, tingling, numbness/throbbing that is not relieved with movement or removing the top wrap that was placed over the thigh and knee. Pt will bring her old velcro wraps next session.    Person(s) Educated  Patient    Methods  Explanation    Comprehension  Verbalized understanding       PT Short Term Goals - 02/21/19 1654      PT SHORT TERM GOAL #1   Title  Pt will be independent with HEP    Baseline  Pt does not have an HEP    Time  6    Period  Weeks    Status  New    Target Date  04/11/19        PT Long Term Goals - 02/21/19 1654      PT LONG TERM GOAL #1   Title  Patient will have reduction of L lower leg limb girth by 3 cm    Baseline  See measurements    Time  12    Period  Weeks    Status  New    Target Date  05/23/19      PT LONG TERM GOAL #2   Title  Patient to be properly fitted with compression garment to wear on daily basis.    Baseline  Pt is unable to fit her compression garments and has no compression for the thighs.    Time  12    Period  Weeks    Status  New    Target Date  05/23/19      PT LONG TERM GOAL #3   Title  Patient will be independent in self-care management principles including self-massage/bandaging and long term management plan for edema.    Baseline  Pt has unmanaged lymphedema that results in chronic wound    Time  12    Period  Weeks    Status  New    Target Date  05/23/19            Plan - 03/23/19 1513    Clinical Impression Statement  Pt presents today with her wrap still on from her last session. She had no drainage today on the lateral aspect of her lower leg and no drainage noted on any of her compression wrap. Pt lower leg shape remains significantly  improved this session. She continues to be consistent with wearing her compression tubigrip over her L knee and thigh. Compression wrap was placed using short stretch bandages from the foot to right above the knee and  tubigrip was used to hold in place with tricofix as a transition over the smaller tubigrip and under the wrapping so pt can wear thigh/knee tubigrip over the wrapping to remove as needed. Pt will benefit from continued POC at this time.    Personal Factors and Comorbidities  Age;Comorbidity 3+    Comorbidities  CHF, acute kidney injury, DM II    Rehab Potential  Good    PT Frequency  2x / week    PT Duration  12 weeks    PT Treatment/Interventions  Electrical Stimulation;Cryotherapy;Gait training;Stair training;Functional mobility training;Therapeutic activities;Therapeutic exercise;Balance training;Neuromuscular re-education;Patient/family education;Manual lymph drainage;Compression bandaging;Passive range of motion;Taping;Vasopneumatic Device    PT Next Visit Plan  Assess wrap, give exercises, assess self MLD and possible work out pants?    PT Home Exercise Plan  ask about pump, provide exercises    Recommended Other Services  Compression pants, velcro wraps, vasopneumatic pump that goe shigher than the thigh and pt will bring in her old velcro wraps next session.    Consulted and Agree with Plan of Care  Patient       Patient will benefit from skilled therapeutic intervention in order to improve the following deficits and impairments:  Difficulty walking, Increased edema  Visit Diagnosis: Lymphedema  Other abnormalities of gait and mobility     Problem List Patient Active Problem List   Diagnosis Date Noted  . Postnasal drip 11/16/2017  . Sensorineural hearing loss (SNHL), bilateral 11/16/2017  . Tinnitus of both ears 11/16/2017  . Obesity, Class II, BMI 35-39.9, isolated (see actual BMI) 11/08/2017  . CKD (chronic kidney disease), stage III 11/08/2017  . Anemia 11/07/2017  . Thrombocytosis ([place]) 11/07/2017  . Lymphedema 11/07/2017  . Routine general medical examination at a health care facility 05/10/2014  . Paroxysmal SVT (supraventricular tachycardia) ([place]) 11/28/2013  .  Physical deconditioning 11/23/2013  . Generalized weakness 11/17/2013  . Nausea 11/17/2013  . Multilevel spine pain 11/17/2013  . Aortic stenosis, mild 11/17/2013  . DM type 2 (diabetes mellitus, type 2) ([place]) 02/02/2013  . Arthritis 10/19/2012  . Mixed incontinence 10/19/2012  . GERD (gastroesophageal reflux disease) 04/20/2011  . HTN (hypertension) 01/20/2011  . Morbid obesity ([place])     Ander Purpura, PT 03/23/2019, 3:16 PM  [place], [place], [address] Phone: [phone]   Fax:  [phone]  Name: [name] [name] MRN: [id number] Date of Birth: 14-Apr-1945

## 2019-03-24 ENCOUNTER — Other Ambulatory Visit: Payer: Self-pay | Admitting: Internal Medicine

## 2019-03-28 ENCOUNTER — Encounter (HOSPITAL_BASED_OUTPATIENT_CLINIC_OR_DEPARTMENT_OTHER): Payer: PPO | Admitting: Internal Medicine

## 2019-03-28 ENCOUNTER — Ambulatory Visit: Payer: PPO

## 2019-03-28 ENCOUNTER — Other Ambulatory Visit: Payer: Self-pay

## 2019-03-28 DIAGNOSIS — E11622 Type 2 diabetes mellitus with other skin ulcer: Secondary | ICD-10-CM | POA: Diagnosis not present

## 2019-03-28 DIAGNOSIS — I890 Lymphedema, not elsewhere classified: Secondary | ICD-10-CM

## 2019-03-28 DIAGNOSIS — S81802A Unspecified open wound, left lower leg, initial encounter: Secondary | ICD-10-CM | POA: Diagnosis not present

## 2019-03-28 DIAGNOSIS — R2689 Other abnormalities of gait and mobility: Secondary | ICD-10-CM

## 2019-03-28 DIAGNOSIS — S81801A Unspecified open wound, right lower leg, initial encounter: Secondary | ICD-10-CM | POA: Diagnosis not present

## 2019-03-28 NOTE — Therapy (Signed)
[place], [place], [address] Phone: [phone]   Fax:  [phone]  Physical Therapy Treatment  Patient Details  Name: [name] [name] MRN: [id number] Date of Birth: Nov 03, 1945 Referring Provider (PT): Darci Current   Encounter Date: 03/28/2019  PT End of Session - 03/28/19 1354    Visit Number  9    Number of Visits  25    Date for PT Re-Evaluation  03/28/19    PT Start Time  [id number]    PT Stop Time  1433    PT Time Calculation (min)  39 min    Activity Tolerance  Patient tolerated treatment well    Behavior During Therapy  [facility] for tasks assessed/performed       Past Medical History:  Diagnosis Date  . Acute kidney injury ([place])   . Aortic stenosis, mild 11/17/2013  . Arthritis    "both knees, spine, back, fingers" (11/29/2013)  . CHF (congestive heart failure) ([place])   . Edema   . GERD (gastroesophageal reflux disease)   . Heart murmur   . History of diastolic dysfunction    Echo [id number] with diastolic dysfunction  . HTN (hypertension)   . Morbid obesity ([place])   . OA (osteoarthritis)   . PAC (premature atrial contraction)    per prior Holter  . Palpitations   . Pneumonia    "as a child"  . PVC's (premature ventricular contractions)    per prior Holter  . SVT (supraventricular tachycardia) ([place])   . Tachycardia    noted at 07/28/11 visit. Started on beta blocker. Possible atrial flutter vs long PR tachycardia/AVNRT  . Type II diabetes mellitus ([place])     Past Surgical History:  Procedure Laterality Date  . CESAREAN SECTION  1985  . CESAREAN SECTION  1985  . SUPRAVENTRICULAR TACHYCARDIA ABLATION  11/29/2013  . SUPRAVENTRICULAR TACHYCARDIA ABLATION N/A 11/29/2013   Procedure: SUPRAVENTRICULAR TACHYCARDIA ABLATION;  Surgeon: Evans Lance, MD;  Location: [facility] CATH LAB;  Service: Cardiovascular;  Laterality: N/A;  . US ECHOCARDIOGRAPHY  07/25/2008   EF 55-60%  . VAGINAL DELIVERY       There were no vitals filed for this visit.  Subjective Assessment - 03/28/19 1354    Subjective  Pt states that she has continued to wear her wrap on her leg since her last visit. She continues with pain at her Bil knees due to arthritis. She forgot to bring her velcro wraps becuase she had an appointment prior to this appointment.    Pertinent History  osteo arthritis, hx cellulitis, phlebitis and history of edema in BLE, Hx Acute kidney injury, CHF and DM II    Patient Stated Goals  I want to get this swelling down and heal my wound.    Currently in Pain?  Yes    Pain Score  8     Pain Location  Knee    Pain Orientation  Right    Pain Descriptors / Indicators  Aching    Pain Type  Chronic pain    Pain Onset  More than a month ago    Pain Frequency  Intermittent                       [facility] Adult PT Treatment/Exercise - 03/28/19 0001      Manual Therapy   Manual Therapy  Edema management;Compression Bandaging    Manual therapy comments  Pt has no wounds noted on the L lower  leg. Desquamation continues at the lower leg and large pieces fall off naturally without physical therapist intervention. Pt was washed with soap and warm water and Triamcinolone acetonide and moisturizer was used prior to donning compression bandage per MD order. No wound dressings were performed this session and no drainage was present on removing the wrap.     Edema Management  Pt will continue wearing tubigrip over the L knee and thigh. Pt was educated on removing her wrap after 2-3 days and leaving tubigrip in place on the lower leg if she is unable to don her velcro wrap.     Compression Bandaging  Pt was wrapped with 2 artiflex for shaping and 1 8 cm roman sandal, 3 10cm for ankle sole heel and spiral wrap to above the knee. trubigrip was placed over the top of the lower leg to thigh to transition over the thigh before thigh wrap was added for ease of removal if pt needs to remove wrap.               PT Education - 03/28/19 1534    Education Details  Pt will continue to wear her tubigrip over her L knee and thigh. She will remove her wrap after 2-3 days and then try to don her velcro wrap. If she is unable to don her velcro wrap she will wear her tubigrip and try to elevate her legs as much as possible. She will remove her wraps if she experiences any pain, tingling, numbnees, throbbing tha tis not relieved with movement or removing that top wrap that was placed over the thigh and knee. Pt will bring her old velcro wraps next session.    Person(s) Educated  Patient    Methods  Explanation    Comprehension  Verbalized understanding       PT Short Term Goals - 02/21/19 1654      PT SHORT TERM GOAL #1   Title  Pt will be independent with HEP    Baseline  Pt does not have an HEP    Time  6    Period  Weeks    Status  New    Target Date  04/11/19        PT Long Term Goals - 02/21/19 1654      PT LONG TERM GOAL #1   Title  Patient will have reduction of L lower leg limb girth by 3 cm    Baseline  See measurements    Time  12    Period  Weeks    Status  New    Target Date  05/23/19      PT LONG TERM GOAL #2   Title  Patient to be properly fitted with compression garment to wear on daily basis.    Baseline  Pt is unable to fit her compression garments and has no compression for the thighs.    Time  12    Period  Weeks    Status  New    Target Date  05/23/19      PT LONG TERM GOAL #3   Title  Patient will be independent in self-care management principles including self-massage/bandaging and long term management plan for edema.    Baseline  Pt has unmanaged lymphedema that results in chronic wound    Time  12    Period  Weeks    Status  New    Target Date  05/23/19  Plan - 03/28/19 1354    Clinical Impression Statement  Pt presents today with her wrap still on from her last session. She has no drainage today on any areas in her L lower leg.  She was washed with soap and warm water prior to donning her compression wrap. Pt lower leg shape remains improved this session. She continues to be consistent with wearing her compresison tubigrip over her L knee and thigh. Discussed the importance of a longer term compression garment including a [place] wrap if her garment is not adequate. Copmression wrap was placed using short stretch bandages from the foot to above the knee. This session last short stretch bandage was placed over knee/thigh tubigrip in order to make removal easy due to pt seems unsure of which bandage to remove if it slides. Pt will benefit from continued POC at this time.    Personal Factors and Comorbidities  Age;Comorbidity 3+    Comorbidities  CHF, acute kidney injury, DM II    PT Frequency  2x / week    PT Duration  12 weeks    PT Treatment/Interventions  Electrical Stimulation;Cryotherapy;Gait training;Stair training;Functional mobility training;Therapeutic activities;Therapeutic exercise;Balance training;Neuromuscular re-education;Patient/family education;Manual lymph drainage;Compression bandaging;Passive range of motion;Taping;Vasopneumatic Device    PT Next Visit Plan  progress note next session, Assess wrap, give exercises, assess self MLD and possible work out pants?    PT Home Exercise Plan  ask about pump, provide exercises    Consulted and Agree with Plan of Care  Patient       Patient will benefit from skilled therapeutic intervention in order to improve the following deficits and impairments:  Difficulty walking, Increased edema  Visit Diagnosis: Lymphedema  Other abnormalities of gait and mobility     Problem List Patient Active Problem List   Diagnosis Date Noted  . Postnasal drip 11/16/2017  . Sensorineural hearing loss (SNHL), bilateral 11/16/2017  . Tinnitus of both ears 11/16/2017  . Obesity, Class II, BMI 35-39.9, isolated (see actual BMI) 11/08/2017  . CKD (chronic kidney disease), stage III  11/08/2017  . Anemia 11/07/2017  . Thrombocytosis ([place]) 11/07/2017  . Lymphedema 11/07/2017  . Routine general medical examination at a health care facility 05/10/2014  . Paroxysmal SVT (supraventricular tachycardia) ([place]) 11/28/2013  . Physical deconditioning 11/23/2013  . Generalized weakness 11/17/2013  . Nausea 11/17/2013  . Multilevel spine pain 11/17/2013  . Aortic stenosis, mild 11/17/2013  . DM type 2 (diabetes mellitus, type 2) ([place]) 02/02/2013  . Arthritis 10/19/2012  . Mixed incontinence 10/19/2012  . GERD (gastroesophageal reflux disease) 04/20/2011  . HTN (hypertension) 01/20/2011  . Morbid obesity [facility])     Ander Purpura, PT 03/28/2019, 3:40 PM  [place], [place], [address] Phone: [phone]   Fax:  [phone]  Name: Lielle Schroth MRN: [id number] Date of Birth: [birth year]

## 2019-03-29 NOTE — Progress Notes (Signed)
[name] [name], [name] [name] ([id number]) Visit Report for 03/28/2019 Arrival Information Details Patient Name: Date of Service: [name] [name], [name] [name] 03/28/2019 3:15 PM Medical Record [id number] Patient Account Number: [id number] Date of Birth/Sex: Treating RN: 08-17-1945 (73 y.o. Elam Dutch Primary Care Tyriq Moragne: [name] Other Clinician: Referring Donato Studley: Treating Shiesha Jahn/Extender:Robson, Tenna Child, [place] Siren in Treatment: 11 Visit Information History Since Last Visit Walker All ordered tests and consults were completed: Yes Patient Arrived: Added or deleted any medications: No Arrival Time: 15:54 Any new allergies or adverse reactions: No Accompanied By: self Had a fall or experienced change in No Transfer Assistance: None activities of daily living that may affect Patient Identification Verified: Yes risk of falls: Secondary Verification Process Completed: Yes Signs or symptoms of abuse/neglect since last No Patient Requires Transmission-Based No visito Precautions: Hospitalized since last visit: No Patient Has Alerts: No Implantable device outside of the clinic excluding No cellular tissue based products placed in the center since last visit: Has Dressing in Place as Prescribed: Yes Has Compression in Place as Prescribed: Yes Pain Present Now: Yes Electronic Signature(s) Signed: 03/28/2019 6:13:08 PM By: Baruch Gouty RN, BSN Entered By: Baruch Gouty on 03/28/2019 15:59:55 -------------------------------------------------------------------------------- Compression Therapy Details Patient Name: Date of Service: [name] [name], [name] [name] 03/28/2019 3:15 PM Medical Record [id number] Patient Account Number: [id number] Date of Birth/Sex: Treating RN: 1945/06/04 (73 y.o. [name] Primary Care Eriyah Fernando: [name] Other Clinician: Referring Dezman Granda: Treating Ileen Kahre/Extender:Robson, Tenna Child, [place] Siren in  Treatment: 11 Compression Therapy Performed for Wound Wound #28 Right,Lateral Lower Leg Assessment: Performed By: Clinician Carlene Coria, RN Compression Type: Four Layer Post Procedure Diagnosis Same as Pre-procedure Electronic Signature(s) Signed: 03/29/2019 11:01:46 AM By: Carlene Coria RN Entered By: Carlene Coria on 03/28/2019 16:56:26 -------------------------------------------------------------------------------- Compression Therapy Details Patient Name: Date of Service: [name] [name], [name] [name] 03/28/2019 3:15 PM Medical Record [id number] Patient Account Number: [id number] Date of Birth/Sex: Treating RN: [birth year] (73 y.o. [name] Primary Care Treston Coker: [name] Other Clinician: Referring Rayjon Wery: Treating Nolon Yellin/Extender:Robson, Tenna Child, [place] Siren in Treatment: 11 Compression Therapy Performed for Wound Wound #31 Right,Distal,Posterior Lower Leg Assessment: Performed By: Jake Church, RN Compression Type: Four Layer Post Procedure Diagnosis Same as Pre-procedure Electronic Signature(s) Signed: 03/29/2019 11:01:46 AM By: Carlene Coria RN Entered By: Carlene Coria on 03/28/2019 16:56:26 -------------------------------------------------------------------------------- Compression Therapy Details Patient Name: Date of Service: [name] [name], [name] [name] 03/28/2019 3:15 PM Medical Record [id number] Patient Account Number: [id number] Date of Birth/Sex: Treating RN: 1945/05/08 (73 y.o. [name] Primary Care Romell Wolden: [name] Other Clinician: Referring Jerzy Crotteau: Treating Maxi Rodas/Extender:Robson, Tenna Child, [place] Siren in Treatment: 11 Compression Therapy Performed for Wound Wound #37 Right,Anterior Lower Leg Assessment: Performed By: Clinician Carlene Coria, RN Compression Type: Four Layer Post Procedure Diagnosis Same as Pre-procedure Electronic Signature(s) Signed: 03/29/2019 11:01:46 AM By: Carlene Coria RN Entered By: Carlene Coria on 03/28/2019 16:56:26 -------------------------------------------------------------------------------- Encounter Discharge Information Details Patient Name: Date of Service: [name] [name], [name] [name] 03/28/2019 3:15 PM Medical Record [id number] Patient Account Number: [id number] Date of Birth/Sex: Treating RN: September 27, 1945 (73 y.o. Clearnce Sorrel Primary Care Gwendelyn Lanting: [name] Other Clinician: Referring Rogelio Winbush: Treating Amariya Liskey/Extender:Robson, Tenna Child, [place] Siren in Treatment: 11 Encounter Discharge Information Items Discharge Condition: Stable Ambulatory Status: Walker Discharge Destination: Home Transportation: Other Accompanied By: self Schedule Follow-up Appointment: Yes Clinical Summary of Care: Patient Declined Electronic Signature(s) Signed: 03/29/2019 4:54:21 PM By: Kela Millin Entered By: Kela Millin on 03/28/2019 17:17:07 -------------------------------------------------------------------------------- Lower Extremity Assessment Details Patient Name: Date of Service: [name] [name], [name] [name] 03/28/2019 3:15 PM Medical Record [id number] Patient Account Number:  [id number] Date of Birth/Sex: Treating RN: Aug 26, 1945 (73 y.o. Elam Dutch Primary Care Kiyomi Pallo: [name] Other Clinician: Referring Zulma Court: Treating Marisel Tostenson/Extender:Robson, Tenna Child, [place] Siren in Treatment: 11 Edema Assessment Assessed: [Left: No] [Right: No] Edema: [Left: Ye] [Right: s] Calf Left: Right: Point of Measurement: 30 cm From Medial Instep cm 54.5 cm Ankle Left: Right: Point of Measurement: 8 cm From Medial Instep cm 27.8 cm Vascular Assessment Pulses: Dorsalis Pedis Palpable: [Right:Yes] Electronic Signature(s) Signed: 03/28/2019 6:13:08 PM By: Baruch Gouty RN, BSN Entered By: Baruch Gouty on 03/28/2019  16:05:10 -------------------------------------------------------------------------------- Multi Wound Chart Details Patient Name: Date of Service: [name] [name], [name] [name] 03/28/2019 3:15 PM Medical Record [id number] Patient Account Number: [id number] Date of Birth/Sex: Treating RN: 04-29-1945 (73 y.o. F) Primary Care Rockne Dearinger: [name] Other Clinician: Referring Ellissa Ayo: Treating Onur Mori/Extender:Robson, Tenna Child, [place] Siren in Treatment: 11 Vital Signs Height(in): 61 Pulse(bpm): 81 Weight(lbs): 240 Blood Pressure(mmHg): 156/79 Body Mass Index(BMI): 33 Temperature(F): 98.0 Respiratory 20 Rate(breaths/min): Photos: [28:No Photos] [31:No Photos] [32:No Photos] Wound Location: [28:Right Lower Leg - Lateral] [31:Right Lower Leg - Posterior, Left, Anterior Lower Leg Distal] Wounding Event: [28:Gradually Appeared] [31:Gradually Appeared] [32:Gradually Appeared] Primary Etiology: [28:Lymphedema] [31:Lymphedema] [32:Lymphedema] 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] [31:Lower Extremity] [32:Lower Extremity] Comorbid History: [28:Cataracts, Lymphedema, Cataracts, Lymphedema, N/A Arrhythmia, Congestive Heart Failure, Hypertension, Heart Failure, Hypertension, Peripheral Venous Disease, Peripheral Venous Disease, Type II Diabetes, Osteoarthritis, Neuropathy,  Osteoarthritis, Neuropathy, Confinement Anxiety] [31:Arrhythmia, Congestive Type II Diabetes, Confinement Anxiety] Date Acquired: [28:08/05/2018] [31:08/05/2018] [32:08/05/2018] Weeks of Treatment: [28:11] [31:11] [32:11] Wound Status: [28:Open] [31:Open] [32:Healed - Epithelialized] Measurements L x W x D 1x1.2x0.1 [31:2.3x1.5x0.1] [32:0x0x0] (cm) Area (cm) : [28:0.942] [31:2.71] [32:0] Volume (cm) : [28:0.094] [31:0.271] [32:0] % Reduction in Area: [28:97.40%] [31:-165.40%] [32:100.00%] % Reduction in Volume: [28:97.40%]  [31:-165.70%] [32:100.00%] Classification: [28:Full Thickness Without Exposed Support Structures Exposed Support Structures Exposed Support Structures] [31:Full Thickness Without] [32:Full Thickness Without] Exudate Amount: [28:Small] [31:Small] [32:N/A] Exudate Type: [28:Serous] [31:Serous] [32:N/A] Exudate Color: [28:amber] [31:amber] [32:N/A] Wound Margin: [28:Flat and Intact] [31:Flat and Intact] [32:N/A] Granulation Amount: [28:Large (67-100%)] [31:Large (67-100%)] [32:N/A] Granulation Quality: [28:Pink] [31:Pink] [32:N/A] Necrotic Amount: [28:None Present (0%)] [31:None Present (0%)] [32:N/A] Exposed Structures: [28:Fat Layer (Subcutaneous Fat Layer (Subcutaneous N/A Tissue) Exposed: Yes Fascia: No Tendon: No Muscle: No Joint: No Bone: No] [31:Tissue) Exposed: Yes Fascia: No Tendon: No Muscle: No Joint: No Bone: No] Epithelialization: [28:Medium (34-66%)] [31:Large (67-100%)] [32:N/A] Procedures Performed: [28:Compression Therapy 33] [31:Compression Therapy 37] [32:N/A N/A] Photos: [28:No Photos] [31:No Photos] [32:N/A] Wound Location: [28:Left, Proximal, Lateral Lower Leg] [31:Right Lower Leg - Anterior N/A] Wounding Event: [28:Gradually Appeared] [31:Gradually Appeared] [32:N/A] Primary Etiology: [28:Lymphedema] [31:Lymphedema] [32:N/A] Secondary Etiology: [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] [32:N/A] Comorbid History: [28:N/A] [31:Cataracts, Lymphedema, N/A Arrhythmia, Congestive Heart Failure, Hypertension, Peripheral Venous Disease, Type II Diabetes, Osteoarthritis, Neuropathy, Confinement Anxiety] Date Acquired: [28:08/05/2018] [31:03/28/2019] [32:N/A] Weeks of Treatment: [28:11] [31:0] [32:N/A] Wound Status: [28:Healed - Epithelialized] [31:Open] [32:N/A] Measurements L x W x D 0x0x0 [31:1x0.7x0.1] [32:N/A] (cm) Area (cm) : [28:0] [31:0.55] [32:N/A] Volume (cm) : [28:0] [31:0.055] [32:N/A] % Reduction in Area: [28:100.00%] [31:N/A] [32:N/A] % Reduction in  Volume: 100.00% [31:N/A] [32:N/A] Classification: [28:Full Thickness Without Exposed Support Structures Exposed Support Structures] [31:Full Thickness Without] [32:N/A] Exudate Amount: [28:N/A] [31:Small] [32:N/A] Exudate Type: [28:N/A] [31:Serosanguineous] [32:N/A] Exudate Color: [28:N/A] [31:red, brown] [32:N/A] Wound Margin: [28:N/A] [31:Flat and Intact] [32:N/A] Granulation Amount: [28:N/A] [31:Large (67-100%)] [32:N/A] Granulation Quality: [28:N/A] [31:Red] [32:N/A] Necrotic Amount: [28:N/A] [31:None Present (  0%)] [32:N/A] Exposed Structures: [28:N/A] [31:Fat Layer (Subcutaneous N/A Tissue) Exposed: Yes Fascia: No Tendon: No Muscle: No Joint: No Bone: No] Epithelialization: [28:N/A N/A] [31:Small (1-33%) Compression Therapy] [32:N/A N/A] Treatment Notes Wound #28 (Right, Lateral Lower Leg) 1. Cleanse With Wound Cleanser Soap and water 2. Periwound Care TCA Cream 3. Primary Dressing Applied Hydrofera Blue 4. Secondary Dressing ABD Pad Kerramax/Xtrasorb 6. Support Layer Applied 4 layer compression wrap Notes stocking. wound care wrapped right leg and lymphedema specialist wrapped left leg Wound #31 (Right, Distal, Posterior Lower Leg) 1. Cleanse With Wound Cleanser Soap and water 2. Periwound Care TCA Cream 3. Primary Dressing Applied Hydrofera Blue 4. Secondary Dressing ABD Pad Kerramax/Xtrasorb 6. Support Layer Applied 4 layer compression wrap Notes stocking. wound care wrapped right leg and lymphedema specialist wrapped left leg Wound #37 (Right, Anterior Lower Leg) 1. Cleanse With Wound Cleanser Soap and water 2. Periwound Care TCA Cream 3. Primary Dressing Applied Hydrofera Blue 4. Secondary Dressing ABD Pad Kerramax/Xtrasorb 6. Support Layer Applied 4 layer compression wrap Notes stocking. wound care wrapped right leg and lymphedema specialist wrapped left leg Electronic Signature(s) Signed: 03/28/2019 6:32:44 PM By: Linton Ham MD Entered By:  Linton Ham on 03/28/2019 18:14:44 -------------------------------------------------------------------------------- [place] Details Patient Name: Date of Service: [name] [name], [name] [name] 03/28/2019 3:15 PM Medical Record [id number] Patient Account Number: [id number] Date of Birth/Sex: Treating RN: 07-08-1945 (73 y.o. [name] Primary Care Clemens Lachman: [name] Other Clinician: Referring Gianah Batt: Treating Haileyann Staiger/Extender:Robson, Tenna Child, [place] Siren in Treatment: 11 Active Inactive Wound/Skin Impairment Nursing Diagnoses: Knowledge deficit related to ulceration/compromised skin integrity Goals: Patient/caregiver will verbalize understanding of skin care regimen Date Initiated: 01/10/2019 Target Resolution Date: 04/14/2019 Goal Status: Active Ulcer/skin breakdown will have a volume reduction of 30% by week 4 Target Resolution Date Initiated: 01/10/2019 Date Inactivated: 02/14/2019 Date: 02/10/2019 Unmet Goal Status: Unmet Reason: comorbities/lymphedema Ulcer/skin breakdown will have a volume reduction of 50% by week 8 Target Resolution Date Initiated: 02/14/2019 Date Inactivated: 03/21/2019 Date: 03/17/2019 Unmet Goal Status: Unmet Reason: comorbities/lymphedema Ulcer/skin breakdown will have a volume reduction of 80% by week 12 Date Initiated: 03/21/2019 Target Resolution Date: 04/21/2019 Goal Status: Active Interventions: Assess patient/caregiver ability to obtain necessary supplies Assess patient/caregiver ability to perform ulcer/skin care regimen upon admission and as needed Assess ulceration(s) every visit Notes: Electronic Signature(s) Signed: 03/29/2019 11:01:46 AM By: Carlene Coria RN Entered By: Carlene Coria on 03/28/2019 14:26:36 -------------------------------------------------------------------------------- Pain Assessment Details Patient Name: Date of Service: [name] [name], [name] [name] 03/28/2019 3:15 PM Medical  Record [id number] Patient Account Number: [id number] Date of Birth/Sex: Treating RN: 1946/01/08 (73 y.o. Elam Dutch Primary Care Briellah Baik: [name] Other Clinician: Referring Taila Basinski: Treating Daelynn Blower/Extender:Robson, Tenna Child, [place] Si[place] in Treatment: 11 Active Problems Location of Pain Severity and Description of Pain Patient Has Paino Yes Site Locations Pain Location: Generalized Pain With Dressing Change: No Duration of the Pain. Constant / Intermittento Intermittent Rate the pain. Current Pain Level: 8 Character of Pain Describe the Pain: Aching, Other: restless legs Pain Management and Medication Current Pain Management: Medication: Yes Is the Current Pain Management Adequate: Adequate How does your wound impact your activities of daily livingo Sleep: No Bathing: No Appetite: No Relationship With Others: No Bladder Continence: No Emotions: No Bowel Continence: No Work: No Toileting: No Drive: No Dressing: No Hobbies: No Electronic Signature(s) Signed: 03/28/2019 6:13:08 PM By: Baruch Gouty RN, BSN Entered By: Baruch Gouty on 03/28/2019 16:10:55 -------------------------------------------------------------------------------- Patient/Caregiver Education Details Patient Name: Date of Service: Waverly Ferrari 12/22/2020andnbsp3:15 PM Medical Record [id number]  Patient Account Number: [id number] Date of Birth/Gender: Treating RN: [birth year] (72 y.o. [name] Primary Care Physician: [name] Other Clinician: Referring Physician: Treating Physician/Extender:Robson, Tenna Child, [place] Si[place] in Treatment: 11 Education Assessment Education Provided To: Patient Education Topics Provided Wound/Skin Impairment: Methods: Explain/Verbal Responses: State content correctly [employer]) Signed: 03/29/2019 11:01:46 AM By: Carlene Coria RN Entered By: Carlene Coria on 03/28/2019  14:26:52 -------------------------------------------------------------------------------- Wound Assessment Details Patient Name: Date of Service: [name] [name], [name] [name] 03/28/2019 3:15 PM Medical Record [id number] Patient Account Number: [id number] Date of Birth/Sex: Treating RN: [birth year] (73 y.o. Elam Dutch Primary Care Shayne Deerman: [name] Other Clinician: Referring Shamir Tuzzolino: Treating Swain Acree/Extender:Robson, Tenna Child, [place] Si[place] in Treatment: 11 Wound Status Wound Number: 28 Primary Lymphedema Etiology: Wound Location: Right Lower Leg - Lateral Secondary Diabetic Wound/Ulcer of the Lower Extremity Wounding Event: Gradually Appeared Etiology: Date Acquired: 08/05/2018 Wound Open Weeks Of Treatment: 11 Status: Clustered Wound: No Comorbid Cataracts, Lymphedema, Arrhythmia, History: Congestive Heart Failure, Hypertension, Peripheral Venous Disease, Type II Diabetes, Osteoarthritis, Neuropathy, Confinement Anxiety Photos Wound Measurements Length: (cm) 1 % Reduct Width: (cm) 1.2 % Reduct Depth: (cm) 0.1 Epitheli Area: (cm) 0.942 Tunneli Volume: (cm) 0.094 Undermi Wound Description Classification: Full Thickness Without Exposed Support Foul Od Structures Slough/ Wound Flat and Intact Margin: Exudate Small Amount: Exudate Serous Type: Exudate amber Color: Wound Bed Granulation Amount: Large (67-100%) Granulation Quality: Pink Fascia Necrotic Amount: None Present (0%) Fat Lay Tendon Muscle Joint E Bone Ex or After Cleansing: No Fibrino No Exposed Structure Exposed: No er (Subcutaneous Tissue) Exposed: Yes Exposed: No Exposed: No xposed: No posed: No ion in Area: 97.4% ion in Volume: 97.4% alization: Medium (34-66%) ng: No ning: No Treatment Notes Wound #28 (Right, Lateral Lower Leg) 1. Cleanse With Wound Cleanser Soap and water 2. Periwound Care TCA Cream 3. Primary Dressing Applied Hydrofera Blue 4. Secondary  Dressing ABD Pad Kerramax/Xtrasorb 6. Support Layer Applied 4 layer compression wrap Notes stocking. wound care wrapped right leg and lymphedema specialist wrapped left leg Electronic Signature(s) Signed: 03/29/2019 3:48:24 PM By: Mikeal Hawthorne EMT/HBOT Signed: 03/29/2019 4:50:49 PM By: Baruch Gouty RN, BSN Previous Signature: 03/28/2019 6:13:08 PM Version By: Baruch Gouty RN, BSN Entered By: Mikeal Hawthorne on 03/29/2019 13:27:21 -------------------------------------------------------------------------------- Wound Assessment Details Patient Name: Date of Service: [name] [name], MAUTHE 03/28/2019 3:15 PM Medical Record [id number] Patient Account Number: [id number] Date of Birth/Sex: Treating RN: September 10, 1945 (73 y.o. Elam Dutch Primary Care Shaquita Fort: [name] Other Clinician: Referring Romaine Maciolek: Treating Lamyra Malcolm/Extender:Robson, Tenna Child, [place] Si[place] in Treatment: 11 Wound Status Wound Number: 31 Primary Lymphedema Etiology: Wound Location: Right Lower Leg - Posterior, Distal Secondary Diabetic Wound/Ulcer of the Lower Extremity Wounding Event: Gradually Appeared Etiology: Date Acquired: 08/05/2018 Wound Open Weeks Of Treatment: 11 Status: Clustered Wound: No Comorbid Cataracts, Lymphedema, Arrhythmia, History: Congestive Heart Failure, Hypertension, Peripheral Venous Disease, Type II Diabetes, Osteoarthritis, Neuropathy, Confinement Anxiety Photos Wound Measurements Length: (cm) 2.3 % Redu Width: (cm) 1.5 % Redu Depth: (cm) 0.1 Epithe Area: (cm) 2.71 Tunne Volume: (cm) 0.271 Under Wound Description Classification: Full Thickness Without Exposed Support Foul O Structures Slough Wound Flat and Intact Margin: Exudate Small Amount: Exudate Serous Type: Exudate amber amber Color: Wound Bed Granulation Amount: Large (67-100%) Granulation Quality: Pink Fascia Necrotic Amount: None Present (0%) Fat  Lay Tendon Muscle Joint E Bone Ex dor After Cleansing: No /Fibrino No Exposed Structure Exposed: No er (Subcutaneous Tissue) Exposed: Yes Exposed: No Exposed: No xposed: No posed: No ction in Area: -165.4% ction in Volume: -165.7% lialization: Large (  67-100%) ling: No mining: No Treatment Notes Wound #31 (Right, Distal, Posterior Lower Leg) 1. Cleanse With Wound Cleanser Soap and water 2. Periwound Care TCA Cream 3. Primary Dressing Applied Hydrofera Blue 4. Secondary Dressing ABD Pad Kerramax/Xtrasorb 6. Support Layer Applied 4 layer compression wrap Notes stocking. wound care wrapped right leg and lymphedema specialist wrapped left leg Electronic Signature(s) Signed: 03/29/2019 3:48:24 PM By: Mikeal Hawthorne EMT/HBOT Signed: 03/29/2019 4:50:49 PM By: Baruch Gouty RN, BSN Previous Signature: 03/28/2019 6:13:08 PM Version By: Baruch Gouty RN, BSN Entered By: Mikeal Hawthorne on 03/29/2019 13:26:42 -------------------------------------------------------------------------------- Wound Assessment Details Patient Name: Date of Service: BRANA, LUCHINI 03/28/2019 3:15 PM Medical Record [id number] Patient Account Number: [id number] Date of Birth/Sex: Treating RN: May 20, 1945 (73 y.o. Elam Dutch Primary Care Jaide Hillenburg: [name] Other Clinician: Referring Brittanya Winburn: Treating Shakora Nordquist/Extender:Robson, Tenna Child, [place] Si[place] in Treatment: 11 Wound Status Wound Number: 32 Primary Etiology: Lymphedema Wound Location: Left, Anterior Lower Leg Secondary Diabetic Wound/Ulcer of the Lower Etiology: Extremity Wounding Event: Gradually Appeared Wound Status: Healed - Epithelialized Date Acquired: 08/05/2018 Weeks Of Treatment: 11 Clustered Wound: No Wound Measurements Length: (cm) 0 Width: (cm) 0 Depth: (cm) 0 Area: (cm) 0 Volume: (cm) 0 Wound Description Classification: Full Thickness Without Exposed Suppor Structures %  Reduction in Area: 100% % Reduction in Volume: 100% t Electronic Signature(s) Signed: 03/28/2019 6:13:08 PM By: Baruch Gouty RN, BSN Entered By: Baruch Gouty on 03/28/2019 16:12:22 -------------------------------------------------------------------------------- Wound Assessment Details Patient Name: Date of Service: MIRAYAH, NOTAH 03/28/2019 3:15 PM Medical Record [id number] Patient Account Number: [id number] Date of Birth/Sex: Treating RN: [birth year] (73 y.o. Elam Dutch Primary Care Charnise Lovan: [name] Other Clinician: Referring Donyell Carrell: Treating Kaleb Linquist/Extender:Robson, Tenna Child, [place] Si[place] in Treatment: 11 Wound Status Wound Number: 33 Primary Etiology: Lymphedema Wound Location: Left, Proximal, Lateral Lower Leg Secondary Diabetic Wound/Ulcer of the Lower Etiology: Extremity Wounding Event: Gradually Appeared Wound Status: Healed - Epithelialized Date Acquired: 08/05/2018 Weeks Of Treatment: 11 Clustered Wound: No Wound Measurements Length: (cm) 0 Width: (cm) 0 Depth: (cm) 0 Area: (cm) 0 Volume: (cm) 0 Wound Description Classification: Full Thickness Without Exposed Suppor Structures % Reduction in Area: 100% % Reduction in Volume: 100% t Electronic Signature(s) Signed: 03/28/2019 6:13:08 PM By: Baruch Gouty RN, BSN Entered By: Baruch Gouty on 03/28/2019 16:12:22 -------------------------------------------------------------------------------- Wound Assessment Details Patient Name: Date of Service: ELOIZA, WENDLANDT 03/28/2019 3:15 PM Medical Record [id number] Patient Account Number: [id number] Date of Birth/Sex: Treating RN: 08/07/1945 (73 y.o. Elam Dutch Primary Care Radley Teston: [name] Other Clinician: Referring Emmeline Winebarger: Treating Yann Biehn/Extender:Robson, Tenna Child, [place] Si[place] in Treatment: 11 Wound Status Wound Number: 37 Primary Lymphedema Etiology: Wound  Location: Right Lower Leg - Anterior Wound Open Wounding Event: Gradually Appeared Status: Date Acquired: 03/28/2019 Comorbid Cataracts, Lymphedema, Arrhythmia, Weeks Of Treatment: 0 History: Congestive Heart Failure, Hypertension, Clustered Wound: No Peripheral Venous Disease, Type II Diabetes, Osteoarthritis, Neuropathy, Confinement Anxiety Photos Wound Measurements Length: (cm) 1 % Reduc Width: (cm) 0.7 % Reduc Depth: (cm) 0.1 Epithel Area: (cm) 0.55 Tunnel Volume: (cm) 0.055 Underm Wound Description Classification: Full Thickness Without Exposed Support Foul O Structures Slough Wound Flat and Intact Margin: Exudate Small Amount: Exudate Serosanguineous Type: Exudate red, brown Color: Wound Bed Granulation Amount: Large (67-100%) Granulation Quality: Red Fascia Necrotic Amount: None Present (0%) Fat Lay Tendon Muscle Joint E Bone Ex dor After Cleansing: No /Fibrino No Exposed Structure Exposed: No er (Subcutaneous Tissue) Exposed: Yes Exposed: No Exposed: No xposed: No posed: No tion in Area: 0% tion in Volume: 0% ialization:  Small (1-33%) ing: No ining: No Treatment Notes Wound #37 (Right, Anterior Lower Leg) 1. Cleanse With Wound Cleanser Soap and water 2. Periwound Care TCA Cream 3. Primary Dressing Applied Hydrofera Blue 4. Secondary Dressing ABD Pad Kerramax/Xtrasorb 6. Support Layer Applied 4 layer compression wrap Notes stocking. wound care wrapped right leg and lymphedema specialist wrapped left leg Electronic Signature(s) Signed: 03/29/2019 3:48:24 PM By: Mikeal Hawthorne EMT/HBOT Signed: 03/29/2019 4:50:49 PM By: Baruch Gouty RN, BSN Previous Signature: 03/28/2019 6:13:08 PM Version By: Baruch Gouty RN, BSN Entered By: Mikeal Hawthorne on 03/29/2019 13:26:18 -------------------------------------------------------------------------------- Vitals Details Patient Name: Date of Service: Trouten, Toniyah 03/28/2019 3:15  PM Medical Record [id number] Patient Account Number: [id number] Date of Birth/Sex: Treating RN: November 23, 1945 (73 y.o. Elam Dutch Primary Care Gethsemane Fischler: [name] Other Clinician: Referring Jacklyne Baik: Treating Iasia Forcier/Extender:Robson, Tenna Child, [place] in Treatment: 11 Vital Signs Time Taken: 16:00 Temperature (F): 98.0 Height (in): 71 Pulse (bpm): 61 Source: Stated Respiratory Rate (breaths/min): 20 Weight (lbs): 240 Blood Pressure (mmHg): 156/79 Source: Stated Reference Range: 80 - 120 mg / dl Body Mass Index (BMI): 33.5 Electronic Signature(s) Signed: 03/28/2019 6:13:08 PM By: Baruch Gouty RN, BSN Entered By: Baruch Gouty on 03/28/2019 16:00:34

## 2019-03-29 NOTE — Progress Notes (Signed)
[name] [name], [name] [name] ([id number]) Visit Report for 03/28/2019 HPI Details Patient Name: Date of Service: [name], [name] 03/28/2019 3:15 PM Medical Record [id number] Patient Account Number: [id number] Date of Birth/Sex: Treating RN: [name] [name] (73 y.o. F) Primary Care Provider: Pricilla [name] Other Clinician: Referring Provider: Treating Provider/Extender:[name] [name], [name] [name], [name] [name] in Treatment: 11 History of Present Illness HPI Description: 04/09/15; the patient is here for review of wounds on her bilateral lower extremities for the last month. The patient has a history of lymphedema and bilateral chronic venous insufficiency with inflammation she was here for review of wounds on her bilateral legs in 2014 she was discharged home with external compression pumps which she is currently not using. Over the last month she developed wounds on her bilateral lower legs with drainage and pain and she is here for our review of this. 04/16/15; the patient's edema is under much better control. She has 3 areas one on the right anterior medial leg one on the left posterior leg and a small open area with purulent drainage on the left anterior leg. I have cultured the area on the left anterior leg 04/23/15; continued improvement in the patient's edema. All her wounds of closed I see no open areas. Culture from the left anterior leg last time was negative. READMISSION 02/07/16; this patient is a woman that I saw her earlier in 2017. I believe we discharged her very quickly with bilateral wounds in her legs secondary to chronic venous hypertension with inflammation and secondary lymphedema. She had juxtalite stockings. She has external compression pumps at home from a stay in our clinic in 2014. She tells me that she's had wounds on her bilateral lower legs since March or April of this year. 3 wounds on the left 2 on the right. She is a type II diabetic and has a history of  noncompressible vessels bilaterally although she is tolerated 4-layer compression. The last note from her primary physician Dr. Pricilla [name] was in May. At that point Dr. [name] had referred the patient here for lymphedema management. The patient stated she made a phone call to year but was not able to arrange an appointment. It is not clear that she is actually using anything on the wounds currently except some gauze that was saturated with drainage per our intake nurse. She is certainly not using her juxtalite stockings and by the patient's own admission she is not using her compression pumps because they are "irritating". I don't see a recent hemoglobin A1c. The patient states she has not been systemically unwell but the legs are painful 02/14/16; patient tolerated her Profore wraps reasonably well. She is using the compression pumps 1 time per day. Measurements of her legs are better in terms of the amount of lymphedema 02/21/16; patient tolerated her Profore wraps it. She says she is using her compression pumps one or 2 times a day. Measurements of her leg circumference are improved and most of her wound dimensions are better, unfortunately the home health that we referred her to did not except her insurance but works successful in getting her accompanied that would except her insurance however she apparently has a $25 co-pay which she cannot afford as she is on a fixed income 03/05/16; patient states she did not back using the pumps. She is healed that 2 wounds on the left leg. She still has areas on the right lateral lower leg, right medial lower leg and left lateral lower leg. All of these look some better and have reduced in  area 03/13/16; still 3 wounds 1 on the left lateral leg, one on the left medial/posterior leg which is really the most extensive and a small area on the right lateral leg. All of this in the setting of severe chronic lymphedema and chronic  venous inflammation. Damage to the skin with cobblestone thickened skin. 03/20/16; still 2 areas. The area on the left lateral leg appears to of closed over. The right medial leg is still most extensive wound although it appears to be closing over right lateral only a small open area remains. Using silver alginate Profore wraps 03/27/16; the area on the left leg remains closed. The edema control is better on the left than the right. The right medial leg is still weeping but everything appears to be epithelialized. The lateral aspect of the right leg appears closed. She tells me she has old juxtalite stockings. She has been compliant with her compression pumps. 04/03/16; the area on the left leg remains closed. She has ordered new juxtalite stockings. The right posterior medial wound is fully epithelialized. However she has an irritated area over the right Achilles that she states is been itching all week under the wraps.. She has been compliant with her compression pumps 04/10/16; the area on the left leg remains closed. The right posterior medial wound is also closed today. The irritated area over the right Achilles which was probably wrap injury is also fully epithelialized and closed. The patient has not received her new juxtalite stockings, she arrives in clinic today with an old juxtalite stocking in place. Sheis using her compression pumps secondary to her severe stage III lymphedema READMISSION 01/21/17; this is a patient we know from several previous stays in this clinic. She has lymphedema with chronic venous insufficiency and inflammation. The last time she was here we discharged her with bilateral juxta lites which she is compliant with. She also has external compression pumps at home from stays in our clinic in 2014 although she tells me that she is not very compliant with these as when she uses them a causes pain in her bilateral knees the patient states that her wounds on her left  greater than right leg including 2 large areas on the left anterior and left lateral leg and an area on the right leg open in late [name] or July since then she is been followed by [facility] and she's had bilateral Unna boot supplied every week for the last month.. I think the wounds have actually improved although they are not specifically dressed. She states she had an ultrasound to rule out DVT bilaterally that was negative. She does not have arterial studies although she is a diabetic. She states she has a lot of pain in her legs she states she does not use her external compression pumps because it causes pain in her knees. As usual her ABIs were noncompressible bilaterally in this clinic 01/28/17; currently the patient only has one small wound on the left lateral leg and I think this should be healed next week. We are going to try to get her bilateral extremitease stockings. She tells me that compression pumps heard her posterior rib arthritic knees so she is not been compliant with this 02/04/17; the patient's wounds are totally healed. She has chronic venous insufficiency and secondary lymphedema. She has new extremitease stockings. She also has compression pumps Readmission: 04/21/17 on evaluation today patient appears to be doing about the same as what she was previous when we were evaluating her back  in November 2018. At that point she had completely healed. With that being said she has now reopened and states that she wasn't aware that she was supposed to come back. 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 Subsequently she also states that although she has the compression that we got for her previously the extremitease stockings she nonetheless doesn't always wear these and being noncompliant often has things will reopen as far as ulcers on her lower extremities. No fevers,  chills, nausea, or vomiting noted at this time. Patient has no evidence of dementia. She is having some discomfort in regard to the ulcers at this point. 04/28/17 on evaluation today patient appears to be doing significantly better in regard to her wounds. With that being said she is still having significant discomfort and is not really keen on sharp debridement. She does not appear to have any evidence of infection which is good news I do think the Iodoflex is beneficial for her. Of note her ABI's were found in the system to be on the right 1.21 with a TBI of 0.76 and on the left 1.35 with a TBI of 0.75. In general her swelling appears to be significantly improved however. No fevers, chills, nausea, or vomiting noted at this time. Patient has been tolerating the wraps without complication. She has no evidence of dementia. 05/05/17-she is here in follow-up evaluation for bilateral lower extremity venous and lymphedema ulcers. She states she has not been using her lymphedema pumps secondary to pain at her knees. After discussion it was noted that she uses knee-high lymphedema pumps but does have a pair of thigh-high lymphedema pumps. She was strongly advised to use the thigh-high lymphedema pumps and if necessary reduce the setting for improved tolerance. She reports no change in odor and/or drainage. She continues to tolerate sharp debridement poorly with significant pain, primarily to the right lower extremity. We will continue with current treatment plan, along with improved lymphedema pump use and follow-up next week 05/12/17 on evaluation today patient actually appears to be doing much better in regard to her bilateral lower extremity ulcers. Only the right altar actually requires debridement today. The other two cleaned off nicely in two of the three in fact on the left lower extremity or actually almost completely healed. She does have some weeping noted still the bilateral lower extremities left  greater than right. She still is not able to use her compression pumps even though I have mentioned this several times to her as did Leah last week. No fevers, chills, nausea, or vomiting noted at this time. Patient has been tolerating the dressing changes without complication. Patient has no evidence of dementia 05/19/17 on evaluation today patient appears to be doing very well regard to her bilateral lower extremity ulcers. In fact she seems to be improving and in fact healed in a couple of locations that were giving her trouble last week. Overall I'm extremely pleased with how things have progressed. She still has some discomfort especially in the right lateral lower extremity. Fortunately there does not appear to be any evidence of significant infection which is good news she does tell me that she has used her compression pumps several times although not routinely over the past week I did praise her for this. I do think it will be beneficial and that might even be what's helping with her wounds as far as healing  is concerned at least to a degree. No fevers, chills, nausea, or vomiting noted at this time. Patient has no dementia and no evidence of infection. 05/26/17 on evaluation today patient presents for evaluation concerning her bilateral lower extremity ulcer secondary to lymphedema. Fortunately she appears to be doing very well at this point in time. Specifically her right leg appears to be completely closed and healed. The left leg though there is still a small area of opening overall has healed. She does have a little bit of excoriation at the top or the wraps are because they slid down on her. Otherwise there's no evidence of infection and the other has improved dramatically now that she is not draining as significantly. Patient is having no significant pain she tells me she has been able to use her lymphedema pumps one time a day although that's not all she can tolerate. Even that is  difficult for her. 06/02/17 on evaluation today patient's ulcers appear to be completely closed at this point. She does still note some odor but I believe this is due to the fact she has not been able to wash her legs as well currently. Fortunately she did bring her compression with her today. She does have the EXTREMIT-EASE Compression. With that being said she is somewhat nervous about going back to this as previously she broke out yet again once we discontinue wrapping her. READMISSION 11/23/17; patient is now 73 year old diabetic woman with severe chronic venous insufficiency with lymphedema. We care for her last in this clinic in February 2019 at which time she had chronic venous insufficiency with lymphedema bilateral lower extremity ulcers. We discharged her with bilateral wrap around/extremitease stockings and compression pumps. Is fairly clear she is not using the compression pumps stating it causes pain in her knees. In any case she was admitted to hospital from 8/3 through 8/6 with a several week history of recurrent bilateral ulcers. She was felt to have bilateral cellulitis treated with doxycycline IV. She is still on oral doxycycline and has another 2 days worth. At discharge her white count was 14.1 hemoglobin at 8.9. The patient is a diabetic however her last ABIs in 2018 showed a right ABI of 1.21, left of 1.35. TBI's were 0.76 on the right 0.75 on the left. Posterior tibial artery was monophasic on the right triphasic on the left. Dorsalis pedis triphasic on the right and triphasic on the left. She is not felt to have significant macrovascular disease 12/02/17;significant bilateral lower extremity wounds secondary to severe uncontrolled lymphedema. She did not get home health out to change the wraps. So she kept this on all week. She is telling me she using his her compression pumps once a week. In general all the wounds look better. We've been using silver alginate with secondary  absorptive dressings 4 layer compression 12/10/17; bilateral lower extremity wounds secondary to uncontrolled lymphedema. She tells me she is using her compression pumps on most days. We've been keeping wraps on all week. Using silver alginate. She has wounds on the right lateral calf left medial and left lateral calf. All of this is somewhat better 12/16/17; the patient has 3 small open areas. One on the right lateral calf, one on the left medial calf and the small area on the left lateral calf. She has lymphedema, chronic venous insufficiency with hemosiderin deposition. Very fibrotic distal lower extremity scan with suggestion of an inverted bottle sign appearance to her legs. She has compression pumps but she does not use  them has not used them in the last week. Currently we have her in 4 layer compression, silver alginate to the wounds and this is being changed once by home health. She is noncompliant with the compression pumps because she says it hurts her knees. She also has compression stockings ando Juxta light stockings at home 12/24/17; the patient's right lateral calf is healed as well as the left medial. There is no open wound on the right leg. She still has 2 areas on the left lateral calf. We transitioned the right leg into an extremitease stocking which she brought in from home. 12/31/17; the patient has a superficial wound on the left lateral lower calf. We've been wrapping this leg. She arrives today with the right leg swollen but without a wound. She cannot get her extremities stocking on the right leg. There is a lot more edema here. She is not using her compression pumps 01/10/2018; patient has a same superficial wound on the left lateral calf. Her right leg has less peripheral edema. She tells me she also started herself on some Lasix that had been previously prescribed for her at some point but she had never taken it. She is also concerned about whether her compression pumps are  leaking. She has extremitease stockings however the edema gets bad enough she can get these on. Currently she has no open wound on the right leg and is mention the edema is actually better than when I saw this 10 days ago 01/17/2018; patient has a same superficial area on the posterior left calf and a weeping area in the mid part of the right lateral calf. She still does not have anyone from [facility] coming to see her compression pumps which she describes as being suspicious for a leakage. I am not really sure how frequently she is using this in any case. She has massive bilateral calf lymphedema. I think there is some spreading lymphedema into her posterior thighs. 01/24/2018; the patient's edema in her legs is actually some better. She still has a smaller area on the left posterior calf although this is better. The weeping area on the right lateral calf is also better. As it turned out she did not get her pumps from [facility] but [facility] and they are going to try to go out and see these this week which is going to help. I explained to her that she will use the compression pumps once a day while we are wrapping her legs but when she transitions to stockings that may be she will require pumping twice a day. Her compliance in this area has not been high and I wonder about her ability to do this herself. 02/01/2018; the area on the posterior left leg is healed. She still has the original wound on the right lateral calf which is epithelialized. The however just above this there is still weeping lymphedema fluid. She has a new area on the upper right calf which is either wrap injury or is she points out where she has been scratching under the wraps. We still not have managed to get a hold of a representative of [facility] to see if the pump is operational. She has extremitease stockings at home however I am not of the opinion that this will maintain her skin  integrity without the compression pumps. 02/08/2018; posterior left leg remains healed although she has a new small open area on the left lateral calf. Right lateral calf has two quarter sized open  areas and close juxtaposition. There is less weeping edema fluid. She apparently managed to contact [facility]. They are sending her a part. But she is still not received it 02/15/2018; the patient has no open wound on either leg. Some scale present on the right upper lateral calf just below the fibular head. Edema control is excellent. She did not bring her stockings or her juxta lite wrap around. She has the part for her compression pump but she has not use the compression pumps yet READMISSION 01/10/2019 Patient is now a 73 year old woman that we have had in the clinic multiple times in the past. She has chronic lower extremity lymphedema, recurrent wounds on her bilateral lower extremities. She is also a type II diabetic reasonably well controlled with a recent hemoglobin A1c while she was in the hospital of 5.9. She has wounds on her bilateral lower extremities mostly on the left lateral and right lateral. These were noted also during her recent hospitalization. The patient states they have been there for a while. She has not been able to wear her stockings he has trouble getting them on as she lives alone. She also has external compression pumps but uses them sparingly because it causes pain in her knees which hurt because of osteoarthritis. The patient was on doxycycline before she went into the hospital apparently for fear of infection in her legs and is on Keflex coming out because of a UTI The patient was recently in the hospital from 01/06/2019 through 01/09/2019. She was admitted with nausea and vomiting and prerenal azotemia. This was corrected with IV fluid her Lasix has been put on hold. Hemoglobin A1c at 5.9 her metformin was discontinued. She was also felt to have a UTI she was  prescribed Keflex at 503 times a day she is picking that up today. The patient is not felt to have an arterial issue. ABIs in our clinic were 1.24 on the right and 1.06 on the left. She had formal studies in 2018 at which time her ABIs were 1.21 on the right TBI of 0.76 and on the left 1.35 and 0.75 respectively. Waveforms are on the right were monophasic and biphasic, triphasic and biphasic on the left. She is tolerated 4 layer compression in the past 10/13. Comes in today having not used her compression pumps /perhaps once in the last week. She has too much edema to consider healing these wounds. We have been using silver alginate 10/20; she tells me he had she used her compression pumps once a day as we asked. Clearly she has edema out of her legs although most of it seems to be settling around her knees which is what she complains about. We have been using silver alginate to the extensive de-epithelialized area on the left lateral calf and a much smaller area on the right lateral extending linearly into the right posterior 10/27; she is using her compression pumps daily. Her edema control is better. We have been using silver alginate to the extensive wound areas on her bilateral lower legs. This includes left anterior left lateral and left posterior. Right anterior right lateral and right posterior. The wounds are not all in the same condition. She has severe bilateral skin damage with the epithelialization, flaking dried epithelium 11/3; she is using her compression pumps 1 times daily. Pretty obvious that her wraps fell down especially on the left leg to mid calf. I think things are improving on the right lateral calf, I do not see anything open  posteriorly. The major areas on the left lateral where she has 3 or 4 deeper wounds in the midst of an entire de-epithelialized area. There is erythema here but no tenderness I think this represents venous stasis rather than cellulitis. I debrided  the areas on the left lateral calf last week but once again they have tightly adherent debris over the top of them which is disappointing 11/10; the patient's major areas on the left lateral calf to 3 wounds with some depth surrounded by a large area of de-epithelialized tissue. There is no infection. We have been using silver alginate under 4-layer compression. On the right lateral she has a more contained wound area that is superficial. Been using silver alginate under 4-layer compression 11/17; the patient's major wound is on the left lateral calf. We have been using silver alginate under compression. There is no open wound per se on the right but it de-epithelialized area on the lateral calf that is weeping edema fluid. I do not see much change here today. 12/1; patient arrives in clinic with her wounds looking better. Her edema control is better. The lymphedema specialist working on the left leg we have been working on the right. We have been using 4-layer compression. The patient is using a consider external compression pumps and Hydrofera Blue to the open areas. She is being measured for an abdominal compression pump and apparently that is going to go through her insurance 12/15; continued improvement. 2 small areas on the left and slightly larger 2 small areas on the right. 12/22; patient still has open areas on the right lateral calf which are superficial and the new one on the right anterior calf. She did not use her compression pumps this week. Her left leg is wrapped by our lymphedema specialist. There are no open wounds here Electronic Signature(s) Signed: 03/28/2019 6:32:44 PM By: Linton Ham MD Entered By: Linton Ham on 03/28/2019 18:15:45 -------------------------------------------------------------------------------- Physical Exam Details Patient Name: Date of Service: WRYN, FENECH 03/28/2019 3:15 PM Medical Record [id number] Patient Account Number:  [id number] Date of Birth/Sex: Treating RN: [birth year] (73 y.o. F) Primary Care Provider: Pricilla [name] Other Clinician: Referring Provider: Treating Provider/Extender:Adryan Druckenmiller, [name] [name], [name] [name] in Treatment: 11 Constitutional Patient is hypertensive.. Pulse regular and within target range for patient.Marland Kitchen Respirations regular, non-labored and within target range.. Temperature is normal and within the target range for the patient.Marland Kitchen Appears in no distress. Respiratory work of breathing is normal. Cardiovascular Pedal pulses are palpable. 4+ lymphedema centered particularly on the right lateral mid tibia area.. Integumentary (Hair, Skin) There is no erythema around the wound. Psychiatric appears at normal baseline. Notes Wound exam Everything is closed on the left side On the right posterior and lateral there is still superficial open areas. She has a new open area medially. She has not been using her pumps and the edema control on the right leg is poor Electronic Signature(s) Signed: 03/28/2019 6:32:44 PM By: Linton Ham MD Entered By: Linton Ham on 03/28/2019 18:18:12 -------------------------------------------------------------------------------- Physician Orders Details Patient Name: Date of Service: SHARLEAN, BURGESON 03/28/2019 3:15 PM Medical Record [id number] Patient Account Number: [id number] Date of Birth/Sex: Treating RN: [birth year] (73 y.o. [name] Primary Care Provider: Pricilla [name] Other Clinician: Referring Provider: Treating Provider/Extender:Majesty Stehlin, [name] [name], [name] [name] in Treatment: 11 Verbal / Phone Orders: No Diagnosis Coding ICD-10 Coding Code Description I89.0 Lymphedema, not elsewhere classified L97.221 Non-pressure chronic ulcer of left calf limited to breakdown of skin L97.211 Non-pressure chronic ulcer of right calf limited to breakdown  of skin E11.622 Type 2 diabetes mellitus with other skin  ulcer Follow-up Appointments Return Appointment in 2 weeks. Nurse Visit: - 1 week Skin Barriers/Peri-Wound Care Barrier cream Moisturizing lotion TCA Cream or Ointment Wound Cleansing May shower and wash wound with soap and water. Primary Wound Dressing Wound #28 Right,Lateral Lower Leg Hydrofera Blue - ready Wound #31 Right,Distal,Posterior Lower Leg Hydrofera Blue - ready Wound #37 Right,Anterior Lower Leg Hydrofera Blue - ready Secondary Dressing Wound #28 Right,Lateral Lower Leg ABD pad Wound #31 Right,Distal,Posterior Lower Leg ABD pad Wound #37 Right,Anterior Lower Leg ABD pad Edema Control 4 layer compression - Right Lower Extremity - unna boot at top to help secure wrap Other: - left leg: PT to apply lymphedema wraps Electronic Signature(s) Signed: 03/28/2019 6:32:44 PM By: Linton Ham MD Signed: 03/29/2019 11:01:46 AM By: Carlene Coria RN Entered By: Carlene Coria on 03/28/2019 16:52:59 -------------------------------------------------------------------------------- Problem List Details Patient Name: Date of Service: SONIKA, GOSLIN 03/28/2019 3:15 PM Medical Record [id number] Patient Account Number: [id number] Date of Birth/Sex: Treating RN: [name] (73 y.o. Voncille Lo, [place] Primary Care Provider: Pricilla [name] Other Clinician: Referring Provider: Treating Provider/Extender:Maykayla Highley, [name] [name], [name] [name] in Treatment: 11 Active Problems ICD-10 Evaluated Encounter Code Description Active Date Today Diagnosis I89.0 Lymphedema, not elsewhere classified 01/10/2019 No Yes L97.221 Non-pressure chronic ulcer of left calf limited to 01/10/2019 No Yes breakdown of skin L97.211 Non-pressure chronic ulcer of right calf limited to 01/10/2019 No Yes breakdown of skin E11.622 Type 2 diabetes mellitus with other skin ulcer 01/10/2019 No Yes Inactive Problems Resolved Problems Electronic Signature(s) Signed: 03/28/2019 6:32:44 PM By: Linton Ham MD Entered By: Linton Ham on 03/28/2019 18:14:37 -------------------------------------------------------------------------------- Progress Note Details Patient Name: Date of Service: KEWANNA, BRADEN 03/28/2019 3:15 PM Medical Record [id number] Patient Account Number: [id number] Date of Birth/Sex: Treating RN: [name] (73 y.o. F) Primary Care Provider: Pricilla [name] Other Clinician: Referring Provider: Treating Provider/Extender:Starasia Sinko, [name] [name], [name] [name] in Treatment: 11 Subjective History of Present Illness (HPI) 04/09/15; the patient is here for review of wounds on her bilateral lower extremities for the last month. The patient has a history of lymphedema and bilateral chronic venous insufficiency with inflammation she was here for review of wounds on her bilateral legs in 2014 she was discharged home with external compression pumps which she is currently not using. Over the last month she developed wounds on her bilateral lower legs with drainage and pain and she is here for our review of this. 04/16/15; the patient's edema is under much better control. She has 3 areas one on the right anterior medial leg one on the left posterior leg and a small open area with purulent drainage on the left anterior leg. I have cultured the area on the left anterior leg 04/23/15; continued improvement in the patient's edema. All her wounds of closed I see no open areas. Culture from the left anterior leg last time was negative. READMISSION 02/07/16; this patient is a woman that I saw her earlier in 2017. I believe we discharged her very quickly with bilateral wounds in her legs secondary to chronic venous hypertension with inflammation and secondary lymphedema. She had juxtalite stockings. She has external compression pumps at home from a stay in our clinic in 2014. She tells me that she's had wounds on her bilateral lower legs since March or April of this year.  3 wounds on the left 2 on the right. She is a type II diabetic and has a history of noncompressible vessels bilaterally although she is  tolerated 4-layer compression. The last note from her primary physician Dr. Pricilla [name] was in May. At that point Dr. [name] had referred the patient here for lymphedema management. The patient stated she made a phone call to year but was not able to arrange an appointment. It is not clear that she is actually using anything on the wounds currently except some gauze that was saturated with drainage per our intake nurse. She is certainly not using her juxtalite stockings and by the patient's own admission she is not using her compression pumps because they are "irritating". I don't see a recent hemoglobin A1c. The patient states she has not been systemically unwell but the legs are painful 02/14/16; patient tolerated her Profore wraps reasonably well. She is using the compression pumps 1 time per day. Measurements of her legs are better in terms of the amount of lymphedema 02/21/16; patient tolerated her Profore wraps it. She says she is using her compression pumps one or 2 times a day. Measurements of her leg circumference are improved and most of her wound dimensions are better, unfortunately the home health that we referred her to did not except her insurance but works successful in getting her accompanied that would except her insurance however she apparently has a $25 co-pay which she cannot afford as she is on a fixed income 03/05/16; patient states she did not back using the pumps. She is healed that 2 wounds on the left leg. She still has areas on the right lateral lower leg, right medial lower leg and left lateral lower leg. All of these look some better and have reduced in area 03/13/16; still 3 wounds 1 on the left lateral leg, one on the left medial/posterior leg which is really the most extensive and a small area on the right lateral leg. All  of this in the setting of severe chronic lymphedema and chronic venous inflammation. Damage to the skin with cobblestone thickened skin. 03/20/16; still 2 areas. The area on the left lateral leg appears to of closed over. The right medial leg is still most extensive wound although it appears to be closing over right lateral only a small open area remains. Using silver alginate Profore wraps 03/27/16; the area on the left leg remains closed. The edema control is better on the left than the right. The right medial leg is still weeping but everything appears to be epithelialized. The lateral aspect of the right leg appears closed. She tells me she has old juxtalite stockings. She has been compliant with her compression pumps. 04/03/16; the area on the left leg remains closed. She has ordered new juxtalite stockings. The right posterior medial wound is fully epithelialized. However she has an irritated area over the right Achilles that she states is been itching all week under the wraps.. She has been compliant with her compression pumps 04/10/16; the area on the left leg remains closed. The right posterior medial wound is also closed today. The irritated area over the right Achilles which was probably wrap injury is also fully epithelialized and closed. The patient has not received her new juxtalite stockings, she arrives in clinic today with an old juxtalite stocking in place. Sheis using her compression pumps secondary to her severe stage III lymphedema READMISSION 01/21/17; this is a patient we know from several previous stays in this clinic. She has lymphedema with chronic venous insufficiency and inflammation. The last time she was here we discharged her with bilateral juxta lites which she is compliant with.  She also has external compression pumps at home from stays in our clinic in 2014 although she tells me that she is not very compliant with these as when she uses them a causes pain in her  bilateral knees the patient states that her wounds on her left greater than right leg including 2 large areas on the left anterior and left lateral leg and an area on the right leg open in late [name] or July since then she is been followed by [facility] and she's had bilateral Unna boot supplied every week for the last month.. I think the wounds have actually improved although they are not specifically dressed. She states she had an ultrasound to rule out DVT bilaterally that was negative. She does not have arterial studies although she is a diabetic. She states she has a lot of pain in her legs she states she does not use her external compression pumps because it causes pain in her knees. As usual her ABIs were noncompressible bilaterally in this clinic 01/28/17; currently the patient only has one small wound on the left lateral leg and I think this should be healed next week. We are going to try to get her bilateral extremitease stockings. She tells me that compression pumps heard her posterior rib arthritic knees so she is not been compliant with this 02/04/17; the patient's wounds are totally healed. She has chronic venous insufficiency and secondary lymphedema. She has new extremitease stockings. She also has compression pumps Readmission: 04/21/17 on evaluation today patient appears to be doing about the same as what she was previous when we were evaluating her back in November 2018. At that point she had completely healed. With that being said she has now reopened and states that she wasn't aware that she was supposed to come back. 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 Subsequently she also states that although she has the compression that we got for her previously the extremitease stockings she nonetheless doesn't always wear these and being noncompliant often has things will  reopen as far as ulcers on her lower extremities. No fevers, chills, nausea, or vomiting noted at this time. Patient has no evidence of dementia. She is having some discomfort in regard to the ulcers at this point. 04/28/17 on evaluation today patient appears to be doing significantly better in regard to her wounds. With that being said she is still having significant discomfort and is not really keen on sharp debridement. She does not appear to have any evidence of infection which is good news I do think the Iodoflex is beneficial for her. Of note her ABI's were found in the system to be on the right 1.21 with a TBI of 0.76 and on the left 1.35 with a TBI of 0.75. In general her swelling appears to be significantly improved however. No fevers, chills, nausea, or vomiting noted at this time. Patient has been tolerating the wraps without complication. She has no evidence of dementia. 05/05/17-she is here in follow-up evaluation for bilateral lower extremity venous and lymphedema ulcers. She states she has not been using her lymphedema pumps secondary to pain at her knees. After discussion it was noted that she uses knee-high lymphedema pumps but does have a pair of thigh-high lymphedema pumps. She was strongly advised to use the thigh-high lymphedema pumps and if necessary reduce the setting for improved tolerance. She reports no change  in odor and/or drainage. She continues to tolerate sharp debridement poorly with significant pain, primarily to the right lower extremity. We will continue with current treatment plan, along with improved lymphedema pump use and follow-up next week 05/12/17 on evaluation today patient actually appears to be doing much better in regard to her bilateral lower extremity ulcers. Only the right altar actually requires debridement today. The other two cleaned off nicely in two of the three in fact on the left lower extremity or actually almost completely healed. She does have  some weeping noted still the bilateral lower extremities left greater than right. She still is not able to use her compression pumps even though I have mentioned this several times to her as did Leah last week. No fevers, chills, nausea, or vomiting noted at this time. Patient has been tolerating the dressing changes without complication. Patient has no evidence of dementia 05/19/17 on evaluation today patient appears to be doing very well regard to her bilateral lower extremity ulcers. In fact she seems to be improving and in fact healed in a couple of locations that were giving her trouble last week. Overall I'm extremely pleased with how things have progressed. She still has some discomfort especially in the right lateral lower extremity. Fortunately there does not appear to be any evidence of significant infection which is good news she does tell me that she has used her compression pumps several times although not routinely over the past week I did praise her for this. I do think it will be beneficial and that might even be what's helping with her wounds as far as healing is concerned at least to a degree. No fevers, chills, nausea, or vomiting noted at this time. Patient has no dementia and no evidence of infection. 05/26/17 on evaluation today patient presents for evaluation concerning her bilateral lower extremity ulcer secondary to lymphedema. Fortunately she appears to be doing very well at this point in time. Specifically her right leg appears to be completely closed and healed. The left leg though there is still a small area of opening overall has healed. She does have a little bit of excoriation at the top or the wraps are because they slid down on her. Otherwise there's no evidence of infection and the other has improved dramatically now that she is not draining as significantly. Patient is having no significant pain she tells me she has been able to use her lymphedema pumps one time a  day although that's not all she can tolerate. Even that is difficult for her. 06/02/17 on evaluation today patient's ulcers appear to be completely closed at this point. She does still note some odor but I believe this is due to the fact she has not been able to wash her legs as well currently. Fortunately she did bring her compression with her today. She does have the EXTREMIT-EASE Compression. With that being said she is somewhat nervous about going back to this as previously she broke out yet again once we discontinue wrapping her. READMISSION 11/23/17; patient is now 73 year old diabetic woman with severe chronic venous insufficiency with lymphedema. We care for her last in this clinic in February 2019 at which time she had chronic venous insufficiency with lymphedema bilateral lower extremity ulcers. We discharged her with bilateral wrap around/extremitease stockings and compression pumps. Is fairly clear she is not using the compression pumps stating it causes pain in her knees. In any case she was admitted to hospital from 8/3 through 8/6 with  a several week history of recurrent bilateral ulcers. She was felt to have bilateral cellulitis treated with doxycycline IV. She is still on oral doxycycline and has another 2 days worth. At discharge her white count was 14.1 hemoglobin at 8.9. The patient is a diabetic however her last ABIs in 2018 showed a right ABI of 1.21, left of 1.35. TBI's were 0.76 on the right 0.75 on the left. Posterior tibial artery was monophasic on the right triphasic on the left. Dorsalis pedis triphasic on the right and triphasic on the left. She is not felt to have significant macrovascular disease 12/02/17;significant bilateral lower extremity wounds secondary to severe uncontrolled lymphedema. She did not get home health out to change the wraps. So she kept this on all week. She is telling me she using his her compression pumps once a week. In general all the wounds  look better. We've been using silver alginate with secondary absorptive dressings 4 layer compression 12/10/17; bilateral lower extremity wounds secondary to uncontrolled lymphedema. She tells me she is using her compression pumps on most days. We've been keeping wraps on all week. Using silver alginate. She has wounds on the right lateral calf left medial and left lateral calf. All of this is somewhat better 12/16/17; the patient has 3 small open areas. One on the right lateral calf, one on the left medial calf and the small area on the left lateral calf. She has lymphedema, chronic venous insufficiency with hemosiderin deposition. Very fibrotic distal lower extremity scan with suggestion of an inverted bottle sign appearance to her legs. She has compression pumps but she does not use them has not used them in the last week. Currently we have her in 4 layer compression, silver alginate to the wounds and this is being changed once by home health. She is noncompliant with the compression pumps because she says it hurts her knees. She also has compression stockings ando Juxta light stockings at home 12/24/17; the patient's right lateral calf is healed as well as the left medial. There is no open wound on the right leg. She still has 2 areas on the left lateral calf. We transitioned the right leg into an extremitease stocking which she brought in from home. 12/31/17; the patient has a superficial wound on the left lateral lower calf. We've been wrapping this leg. She arrives today with the right leg swollen but without a wound. She cannot get her extremities stocking on the right leg. There is a lot more edema here. She is not using her compression pumps 01/10/2018; patient has a same superficial wound on the left lateral calf. Her right leg has less peripheral edema. She tells me she also started herself on some Lasix that had been previously prescribed for her at some point but she had never taken it.  She is also concerned about whether her compression pumps are leaking. She has extremitease stockings however the edema gets bad enough she can get these on. Currently she has no open wound on the right leg and is mention the edema is actually better than when I saw this 10 days ago 01/17/2018; patient has a same superficial area on the posterior left calf and a weeping area in the mid part of the right lateral calf. She still does not have anyone from [facility] coming to see her compression pumps which she describes as being suspicious for a leakage. I am not really sure how frequently she is using this in any case. She has  massive bilateral calf lymphedema. I think there is some spreading lymphedema into her posterior thighs. 01/24/2018; the patient's edema in her legs is actually some better. She still has a smaller area on the left posterior calf although this is better. The weeping area on the right lateral calf is also better. As it turned out she did not get her pumps from [facility] but [facility] and they are going to try to go out and see these this week which is going to help. I explained to her that she will use the compression pumps once a day while we are wrapping her legs but when she transitions to stockings that may be she will require pumping twice a day. Her compliance in this area has not been high and I wonder about her ability to do this herself. 02/01/2018; the area on the posterior left leg is healed. She still has the original wound on the right lateral calf which is epithelialized. The however just above this there is still weeping lymphedema fluid. She has a new area on the upper right calf which is either wrap injury or is she points out where she has been scratching under the wraps. We still not have managed to get a hold of a representative of [facility] to see if the pump is operational. She has extremitease stockings at home however I  am not of the opinion that this will maintain her skin integrity without the compression pumps. 02/08/2018; posterior left leg remains healed although she has a new small open area on the left lateral calf. Right lateral calf has two quarter sized open areas and close juxtaposition. There is less weeping edema fluid. She apparently managed to contact [facility]. They are sending her a part. But she is still not received it 02/15/2018; the patient has no open wound on either leg. Some scale present on the right upper lateral calf just below the fibular head. Edema control is excellent. She did not bring her stockings or her juxta lite wrap around. She has the part for her compression pump but she has not use the compression pumps yet READMISSION 01/10/2019 Patient is now a 73 year old woman that we have had in the clinic multiple times in the past. She has chronic lower extremity lymphedema, recurrent wounds on her bilateral lower extremities. She is also a type II diabetic reasonably well controlled with a recent hemoglobin A1c while she was in the hospital of 5.9. She has wounds on her bilateral lower extremities mostly on the left lateral and right lateral. These were noted also during her recent hospitalization. The patient states they have been there for a while. She has not been able to wear her stockings he has trouble getting them on as she lives alone. She also has external compression pumps but uses them sparingly because it causes pain in her knees which hurt because of osteoarthritis. The patient was on doxycycline before she went into the hospital apparently for fear of infection in her legs and is on Keflex coming out because of a UTI The patient was recently in the hospital from 01/06/2019 through 01/09/2019. She was admitted with nausea and vomiting and prerenal azotemia. This was corrected with IV fluid her Lasix has been put on hold. Hemoglobin A1c at 5.9 her metformin was  discontinued. She was also felt to have a UTI she was prescribed Keflex at 503 times a day she is picking that up today. The patient is not felt to have an  arterial issue. ABIs in our clinic were 1.24 on the right and 1.06 on the left. She had formal studies in 2018 at which time her ABIs were 1.21 on the right TBI of 0.76 and on the left 1.35 and 0.75 respectively. Waveforms are on the right were monophasic and biphasic, triphasic and biphasic on the left. She is tolerated 4 layer compression in the past 10/13. Comes in today having not used her compression pumps /perhaps once in the last week. She has too much edema to consider healing these wounds. We have been using silver alginate 10/20; she tells me he had she used her compression pumps once a day as we asked. Clearly she has edema out of her legs although most of it seems to be settling around her knees which is what she complains about. We have been using silver alginate to the extensive de-epithelialized area on the left lateral calf and a much smaller area on the right lateral extending linearly into the right posterior 10/27; she is using her compression pumps daily. Her edema control is better. We have been using silver alginate to the extensive wound areas on her bilateral lower legs. This includes left anterior left lateral and left posterior. Right anterior right lateral and right posterior. The wounds are not all in the same condition. She has severe bilateral skin damage with the epithelialization, flaking dried epithelium 11/3; she is using her compression pumps 1 times daily. Pretty obvious that her wraps fell down especially on the left leg to mid calf. I think things are improving on the right lateral calf, I do not see anything open posteriorly. The major areas on the left lateral where she has 3 or 4 deeper wounds in the midst of an entire de-epithelialized area. There is erythema here but no tenderness I think this  represents venous stasis rather than cellulitis. I debrided the areas on the left lateral calf last week but once again they have tightly adherent debris over the top of them which is disappointing 11/10; the patient's major areas on the left lateral calf to 3 wounds with some depth surrounded by a large area of de-epithelialized tissue. There is no infection. We have been using silver alginate under 4-layer compression. On the right lateral she has a more contained wound area that is superficial. Been using silver alginate under 4-layer compression 11/17; the patient's major wound is on the left lateral calf. We have been using silver alginate under compression. There is no open wound per se on the right but it de-epithelialized area on the lateral calf that is weeping edema fluid. I do not see much change here today. 12/1; patient arrives in clinic with her wounds looking better. Her edema control is better. The lymphedema specialist working on the left leg we have been working on the right. We have been using 4-layer compression. The patient is using a consider external compression pumps and Hydrofera Blue to the open areas. She is being measured for an abdominal compression pump and apparently that is going to go through her insurance 12/15; continued improvement. 2 small areas on the left and slightly larger 2 small areas on the right. 12/22; patient still has open areas on the right lateral calf which are superficial and the new one on the right anterior calf. She did not use her compression pumps this week. Her left leg is wrapped by our lymphedema specialist. There are no open wounds here Objective Constitutional Patient is hypertensive.. Pulse regular and within  target range for patient.Marland Kitchen Respirations regular, non-labored and within target range.. Temperature is normal and within the target range for the patient.Marland Kitchen Appears in no distress. Vitals Time Taken: 4:00 PM, Height: 71 in,  Source: Stated, Weight: 240 lbs, Source: Stated, BMI: 33.5, Temperature: 98.0 F, Pulse: 61 bpm, Respiratory Rate: 20 breaths/min, Blood Pressure: 156/79 mmHg. Respiratory work of breathing is normal. Cardiovascular Pedal pulses are palpable. 4+ lymphedema centered particularly on the right lateral mid tibia area.Marland Kitchen Psychiatric appears at normal baseline. General Notes: Wound exam ooEverything is closed on the left side ooOn the right posterior and lateral there is still superficial open areas. She has a new open area medially. ooShe has not been using her pumps and the edema control on the right leg is poor Integumentary (Hair, Skin) There is no erythema around the wound. Wound #28 status is Open. Original cause of wound was Gradually Appeared. The wound is located on the Right,Lateral Lower Leg. The wound measures 1cm length x 1.2cm width x 0.1cm depth; 0.942cm^2 area and 0.094cm^3 volume. There is Fat Layer (Subcutaneous Tissue) Exposed exposed. There is no tunneling or undermining noted. There is a small amount of serous drainage noted. The wound margin is flat and intact. There is large (67-100%) pink granulation within the wound bed. There is no necrotic tissue within the wound bed. Wound #31 status is Open. Original cause of wound was Gradually Appeared. The wound is located on the Right,Distal,Posterior Lower Leg. The wound measures 2.3cm length x 1.5cm width x 0.1cm depth; 2.71cm^2 area and 0.271cm^3 volume. There is Fat Layer (Subcutaneous Tissue) Exposed exposed. There is no tunneling or undermining noted. There is a small amount of serous drainage noted. The wound margin is flat and intact. There is large (67-100%) pink granulation within the wound bed. There is no necrotic tissue within the wound bed. Wound #32 status is Healed - Epithelialized. Original cause of wound was Gradually Appeared. The wound is located on the Left,Anterior Lower Leg. The wound measures 0cm length x  0cm width x 0cm depth; 0cm^2 area and 0cm^3 volume. Wound #33 status is Healed - Epithelialized. Original cause of wound was Gradually Appeared. The wound is located on the Left,Proximal,Lateral Lower Leg. The wound measures 0cm length x 0cm width x 0cm depth; 0cm^2 area and 0cm^3 volume. Wound #37 status is Open. Original cause of wound was Gradually Appeared. The wound is located on the Right,Anterior Lower Leg. The wound measures 1cm length x 0.7cm width x 0.1cm depth; 0.55cm^2 area and 0.055cm^3 volume. There is Fat Layer (Subcutaneous Tissue) Exposed exposed. There is no tunneling or undermining noted. There is a small amount of serosanguineous drainage noted. The wound margin is flat and intact. There is large (67-100%) red granulation within the wound bed. There is no necrotic tissue within the wound bed. 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 There was a Four Layer Compression Therapy Procedure by Carlene Coria, RN. Post procedure Diagnosis Wound #28: Same as Pre-Procedure Wound #31 Pre-procedure diagnosis of Wound #31 is a Lymphedema located on the Right,Distal,Posterior Lower Leg . There was a Four Layer Compression Therapy Procedure by Carlene Coria, RN. Post procedure Diagnosis Wound #31: Same as Pre-Procedure Wound #37 Pre-procedure diagnosis of  Wound #37 is a Lymphedema located on the Right,Anterior Lower Leg . There was a Four Layer Compression Therapy Procedure by Carlene Coria, RN. Post procedure Diagnosis Wound #37: Same as Pre-Procedure Plan Follow-up Appointments: Return Appointment in 2 weeks. Nurse Visit: - 1 week Skin  Barriers/Peri-Wound Care: Barrier cream Moisturizing lotion TCA Cream or Ointment Wound Cleansing: May shower and wash wound with soap and water. Primary Wound Dressing: Wound #28 Right,Lateral Lower Leg: Hydrofera Blue - ready Wound #31 Right,Distal,Posterior Lower Leg: Hydrofera Blue - ready Wound #37 Right,Anterior Lower Leg: Hydrofera Blue - ready Secondary Dressing: Wound #28 Right,Lateral Lower Leg: ABD pad Wound #31 Right,Distal,Posterior Lower Leg: ABD pad Wound #37 Right,Anterior Lower Leg: ABD pad Edema Control: 4 layer compression - Right Lower Extremity - unna boot at top to help secure wrap Other: - left leg: PT to apply lymphedema wraps 1. We have continued with the [facility] Blue to the remaining wounds on the right lateral calf and the new one on the right anterior mid tibial area. 2. I have asked her to use her compression pumps. We cannot control the edema in this leg without the Electronic Signature(s) Signed: 03/28/2019 6:32:44 PM By: Linton Ham MD Entered By: Linton Ham on 03/28/2019 18:20:17 -------------------------------------------------------------------------------- SuperBill Details Patient Name: Date of Service: GWENDOLA, POLLAND 03/28/2019 Medical Record [id number] Patient Account Number: [id number] Date of Birth/Sex: Treating RN: [birth year] (73 y.o. [name] Primary Care Provider: Pricilla [name] Other Clinician: Referring Provider: Treating Provider/Extender:Kaulder Zahner, [name] [name], [name] [name] in Treatment: 11 Diagnosis Coding ICD-10 Codes Code Description I89.0 Lymphedema, not elsewhere classified L97.221 Non-pressure chronic ulcer of left calf limited to breakdown of skin L97.211 Non-pressure chronic ulcer of right calf limited to breakdown of skin E11.622 Type 2 diabetes mellitus with other skin ulcer Facility Procedures CPT4 Code Description: [id number] (Facility Use Only) [phone] - APPLY [place] RT LEG Modifier: Quantity: 1 Physician Procedures CPT4 Code Description: [id number] [place] - WC PHYS LEVEL 3 - EST PT ICD-10 Diagnosis Description L97.211 Non-pressure chronic ulcer of right calf limited to br Modifier: [place] of skin Quantity: 1 Electronic Signature(s) Signed: 03/28/2019 6:32:44 PM By: Linton Ham MD Entered By: Linton Ham on 03/28/2019 18:20:36

## 2019-04-04 ENCOUNTER — Encounter (HOSPITAL_BASED_OUTPATIENT_CLINIC_OR_DEPARTMENT_OTHER): Payer: PPO | Admitting: Internal Medicine

## 2019-04-04 ENCOUNTER — Other Ambulatory Visit: Payer: Self-pay

## 2019-04-04 ENCOUNTER — Ambulatory Visit: Payer: PPO

## 2019-04-04 DIAGNOSIS — E11622 Type 2 diabetes mellitus with other skin ulcer: Secondary | ICD-10-CM | POA: Diagnosis not present

## 2019-04-04 DIAGNOSIS — I890 Lymphedema, not elsewhere classified: Secondary | ICD-10-CM | POA: Diagnosis not present

## 2019-04-04 DIAGNOSIS — R2689 Other abnormalities of gait and mobility: Secondary | ICD-10-CM

## 2019-04-04 DIAGNOSIS — S81801A Unspecified open wound, right lower leg, initial encounter: Secondary | ICD-10-CM | POA: Diagnosis not present

## 2019-04-04 DIAGNOSIS — I872 Venous insufficiency (chronic) (peripheral): Secondary | ICD-10-CM | POA: Diagnosis not present

## 2019-04-04 NOTE — Progress Notes (Signed)
[name] [name] ([id number]) Visit Report for 04/04/2019 HPI Details Patient Name: Date of Service: [name] [name], [name] [name] 04/04/2019 3:00 PM Medical Record [id number] Patient Account Number: [id number] Date of Birth/Sex: Treating RN: January 10, 1946 (73 y.o. F) Primary Care Provider: [name] Other Clinician: Referring Provider: Treating Provider/Extender:Caasi Giglia, Tenna Child, [place] Siren in Treatment: 12 History of Present Illness HPI Description: 04/09/15; the patient is here for review of wounds on her bilateral lower extremities for the last month. The patient has a history of lymphedema and bilateral chronic venous insufficiency with inflammation she was here for review of wounds on her bilateral legs in 2014 she was discharged home with external compression pumps which she is currently not using. Over the last month she developed wounds on her bilateral lower legs with drainage and pain and she is here for our review of this. 04/16/15; the patient's edema is under much better control. She has 3 areas one on the right anterior medial leg one on the left posterior leg and a small open area with purulent drainage on the left anterior leg. I have cultured the area on the left anterior leg 04/23/15; continued improvement in the patient's edema. All her wounds of closed I see no open areas. Culture from the left anterior leg last time was negative. READMISSION 02/07/16; this patient is a woman that I saw her earlier in 2017. I believe we discharged her very quickly with bilateral wounds in her legs secondary to chronic venous hypertension with inflammation and secondary lymphedema. She had juxtalite stockings. She has external compression pumps at home from a stay in our clinic in 2014. She tells me that she's had wounds on her bilateral lower legs since March or April of this year. 3 wounds on the left 2 on the right. She is a type II diabetic and has a history of  noncompressible vessels bilaterally although she is tolerated 4-layer compression. The last note from her primary physician Dr. [name] was in May. At that point Dr. [name] had referred the patient here for lymphedema management. The patient stated she made a phone call to year but was not able to arrange an appointment. It is not clear that she is actually using anything on the wounds currently except some gauze that was saturated with drainage per our intake nurse. She is certainly not using her juxtalite stockings and by the patient's own admission she is not using her compression pumps because they are "irritating". I don't see a recent hemoglobin A1c. The patient states she has not been systemically unwell but the legs are painful 02/14/16; patient tolerated her Profore wraps reasonably well. She is using the compression pumps 1 time per day. Measurements of her legs are better in terms of the amount of lymphedema 02/21/16; patient tolerated her Profore wraps it. She says she is using her compression pumps one or 2 times a day. Measurements of her leg circumference are improved and most of her wound dimensions are better, unfortunately the home health that we referred her to did not except her insurance but works successful in getting her accompanied that would except her insurance however she apparently has a $25 co-pay which she cannot afford as she is on a fixed income 03/05/16; patient states she did not back using the pumps. She is healed that 2 wounds on the left leg. She still has areas on the right lateral lower leg, right medial lower leg and left lateral lower leg. All of these look some better and have reduced in  area 03/13/16; still 3 wounds 1 on the left lateral leg, one on the left medial/posterior leg which is really the most extensive and a small area on the right lateral leg. All of this in the setting of severe chronic lymphedema and chronic  venous inflammation. Damage to the skin with cobblestone thickened skin. 03/20/16; still 2 areas. The area on the left lateral leg appears to of closed over. The right medial leg is still most extensive wound although it appears to be closing over right lateral only a small open area remains. Using silver alginate Profore wraps 03/27/16; the area on the left leg remains closed. The edema control is better on the left than the right. The right medial leg is still weeping but everything appears to be epithelialized. The lateral aspect of the right leg appears closed. She tells me she has old juxtalite stockings. She has been compliant with her compression pumps. 04/03/16; the area on the left leg remains closed. She has ordered new juxtalite stockings. The right posterior medial wound is fully epithelialized. However she has an irritated area over the right Achilles that she states is been itching all week under the wraps.. She has been compliant with her compression pumps 04/10/16; the area on the left leg remains closed. The right posterior medial wound is also closed today. The irritated area over the right Achilles which was probably wrap injury is also fully epithelialized and closed. The patient has not received her new juxtalite stockings, she arrives in clinic today with an old juxtalite stocking in place. Sheis using her compression pumps secondary to her severe stage III lymphedema READMISSION 01/21/17; this is a patient we know from several previous stays in this clinic. She has lymphedema with chronic venous insufficiency and inflammation. The last time she was here we discharged her with bilateral juxta lites which she is compliant with. She also has external compression pumps at home from stays in our clinic in 2014 although she tells me that she is not very compliant with these as when she uses them a causes pain in her bilateral knees the patient states that her wounds on her left  greater than right leg including 2 large areas on the left anterior and left lateral leg and an area on the right leg open in late June or July since then she is been followed by [facility] and she's had bilateral Unna boot supplied every week for the last month.. I think the wounds have actually improved although they are not specifically dressed. She states she had an ultrasound to rule out DVT bilaterally that was negative. She does not have arterial studies although she is a diabetic. She states she has a lot of pain in her legs she states she does not use her external compression pumps because it causes pain in her knees. As usual her ABIs were noncompressible bilaterally in this clinic 01/28/17; currently the patient only has one small wound on the left lateral leg and I think this should be healed next week. We are going to try to get her bilateral extremitease stockings. She tells me that compression pumps heard her posterior rib arthritic knees so she is not been compliant with this 02/04/17; the patient's wounds are totally healed. She has chronic venous insufficiency and secondary lymphedema. She has new extremitease stockings. She also has compression pumps Readmission: 04/21/17 on evaluation today patient appears to be doing about the same as what she was previous when we were evaluating her back  in November 2018. At that point she had completely healed. With that being said she has now reopened and states that she wasn't aware that she was supposed to come back. 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 Subsequently she also states that although she has the compression that we got for her previously the extremitease stockings she nonetheless doesn't always wear these and being noncompliant often has things will reopen as far as ulcers on her lower extremities. No fevers,  chills, nausea, or vomiting noted at this time. Patient has no evidence of dementia. She is having some discomfort in regard to the ulcers at this point. 04/28/17 on evaluation today patient appears to be doing significantly better in regard to her wounds. With that being said she is still having significant discomfort and is not really keen on sharp debridement. She does not appear to have any evidence of infection which is good news I do think the Iodoflex is beneficial for her. Of note her ABI's were found in the system to be on the right 1.21 with a TBI of 0.76 and on the left 1.35 with a TBI of 0.75. In general her swelling appears to be significantly improved however. No fevers, chills, nausea, or vomiting noted at this time. Patient has been tolerating the wraps without complication. She has no evidence of dementia. 05/05/17-she is here in follow-up evaluation for bilateral lower extremity venous and lymphedema ulcers. She states she has not been using her lymphedema pumps secondary to pain at her knees. After discussion it was noted that she uses knee-high lymphedema pumps but does have a pair of thigh-high lymphedema pumps. She was strongly advised to use the thigh-high lymphedema pumps and if necessary reduce the setting for improved tolerance. She reports no change in odor and/or drainage. She continues to tolerate sharp debridement poorly with significant pain, primarily to the right lower extremity. We will continue with current treatment plan, along with improved lymphedema pump use and follow-up next week 05/12/17 on evaluation today patient actually appears to be doing much better in regard to her bilateral lower extremity ulcers. Only the right altar actually requires debridement today. The other two cleaned off nicely in two of the three in fact on the left lower extremity or actually almost completely healed. She does have some weeping noted still the bilateral lower extremities left  greater than right. She still is not able to use her compression pumps even though I have mentioned this several times to her as did Leah last week. No fevers, chills, nausea, or vomiting noted at this time. Patient has been tolerating the dressing changes without complication. Patient has no evidence of dementia 05/19/17 on evaluation today patient appears to be doing very well regard to her bilateral lower extremity ulcers. In fact she seems to be improving and in fact healed in a couple of locations that were giving her trouble last week. Overall I'm extremely pleased with how things have progressed. She still has some discomfort especially in the right lateral lower extremity. Fortunately there does not appear to be any evidence of significant infection which is good news she does tell me that she has used her compression pumps several times although not routinely over the past week I did praise her for this. I do think it will be beneficial and that might even be what's helping with her wounds as far as healing  is concerned at least to a degree. No fevers, chills, nausea, or vomiting noted at this time. Patient has no dementia and no evidence of infection. 05/26/17 on evaluation today patient presents for evaluation concerning her bilateral lower extremity ulcer secondary to lymphedema. Fortunately she appears to be doing very well at this point in time. Specifically her right leg appears to be completely closed and healed. The left leg though there is still a small area of opening overall has healed. She does have a little bit of excoriation at the top or the wraps are because they slid down on her. Otherwise there's no evidence of infection and the other has improved dramatically now that she is not draining as significantly. Patient is having no significant pain she tells me she has been able to use her lymphedema pumps one time a day although that's not all she can tolerate. Even that is  difficult for her. 06/02/17 on evaluation today patient's ulcers appear to be completely closed at this point. She does still note some odor but I believe this is due to the fact she has not been able to wash her legs as well currently. Fortunately she did bring her compression with her today. She does have the EXTREMIT-EASE Compression. With that being said she is somewhat nervous about going back to this as previously she broke out yet again once we discontinue wrapping her. READMISSION 11/23/17; patient is now 73 year old diabetic woman with severe chronic venous insufficiency with lymphedema. We care for her last in this clinic in February 2019 at which time she had chronic venous insufficiency with lymphedema bilateral lower extremity ulcers. We discharged her with bilateral wrap around/extremitease stockings and compression pumps. Is fairly clear she is not using the compression pumps stating it causes pain in her knees. In any case she was admitted to hospital from 8/3 through 8/6 with a several week history of recurrent bilateral ulcers. She was felt to have bilateral cellulitis treated with doxycycline IV. She is still on oral doxycycline and has another 2 days worth. At discharge her white count was 14.1 hemoglobin at 8.9. The patient is a diabetic however her last ABIs in 2018 showed a right ABI of 1.21, left of 1.35. TBI's were 0.76 on the right 0.75 on the left. Posterior tibial artery was monophasic on the right triphasic on the left. Dorsalis pedis triphasic on the right and triphasic on the left. She is not felt to have significant macrovascular disease 12/02/17;significant bilateral lower extremity wounds secondary to severe uncontrolled lymphedema. She did not get home health out to change the wraps. So she kept this on all week. She is telling me she using his her compression pumps once a week. In general all the wounds look better. We've been using silver alginate with secondary  absorptive dressings 4 layer compression 12/10/17; bilateral lower extremity wounds secondary to uncontrolled lymphedema. She tells me she is using her compression pumps on most days. We've been keeping wraps on all week. Using silver alginate. She has wounds on the right lateral calf left medial and left lateral calf. All of this is somewhat better 12/16/17; the patient has 3 small open areas. One on the right lateral calf, one on the left medial calf and the small area on the left lateral calf. She has lymphedema, chronic venous insufficiency with hemosiderin deposition. Very fibrotic distal lower extremity scan with suggestion of an inverted bottle sign appearance to her legs. She has compression pumps but she does not use  them has not used them in the last week. Currently we have her in 4 layer compression, silver alginate to the wounds and this is being changed once by home health. She is noncompliant with the compression pumps because she says it hurts her knees. She also has compression stockings ando Juxta light stockings at home 12/24/17; the patient's right lateral calf is healed as well as the left medial. There is no open wound on the right leg. She still has 2 areas on the left lateral calf. We transitioned the right leg into an extremitease stocking which she brought in from home. 12/31/17; the patient has a superficial wound on the left lateral lower calf. We've been wrapping this leg. She arrives today with the right leg swollen but without a wound. She cannot get her extremities stocking on the right leg. There is a lot more edema here. She is not using her compression pumps 01/10/2018; patient has a same superficial wound on the left lateral calf. Her right leg has less peripheral edema. She tells me she also started herself on some Lasix that had been previously prescribed for her at some point but she had never taken it. She is also concerned about whether her compression pumps are  leaking. She has extremitease stockings however the edema gets bad enough she can get these on. Currently she has no open wound on the right leg and is mention the edema is actually better than when I saw this 10 days ago 01/17/2018; patient has a same superficial area on the posterior left calf and a weeping area in the mid part of the right lateral calf. She still does not have anyone from [facility] coming to see her compression pumps which she describes as being suspicious for a leakage. I am not really sure how frequently she is using this in any case. She has massive bilateral calf lymphedema. I think there is some spreading lymphedema into her posterior thighs. 01/24/2018; the patient's edema in her legs is actually some better. She still has a smaller area on the left posterior calf although this is better. The weeping area on the right lateral calf is also better. As it turned out she did not get her pumps from [facility] but [facility] and they are going to try to go out and see these this week which is going to help. I explained to her that she will use the compression pumps once a day while we are wrapping her legs but when she transitions to stockings that may be she will require pumping twice a day. Her compliance in this area has not been high and I wonder about her ability to do this herself. 02/01/2018; the area on the posterior left leg is healed. She still has the original wound on the right lateral calf which is epithelialized. The however just above this there is still weeping lymphedema fluid. She has a new area on the upper right calf which is either wrap injury or is she points out where she has been scratching under the wraps. We still not have managed to get a hold of a representative of [facility] to see if the pump is operational. She has extremitease stockings at home however I am not of the opinion that this will maintain her skin  integrity without the compression pumps. 02/08/2018; posterior left leg remains healed although she has a new small open area on the left lateral calf. Right lateral calf has two quarter sized open  areas and close juxtaposition. There is less weeping edema fluid. She apparently managed to contact [facility]. They are sending her a part. But she is still not received it 02/15/2018; the patient has no open wound on either leg. Some scale present on the right upper lateral calf just below the fibular head. Edema control is excellent. She did not bring her stockings or her juxta lite wrap around. She has the part for her compression pump but she has not use the compression pumps yet READMISSION 01/10/2019 Patient is now a 73 year old woman that we have had in the clinic multiple times in the past. She has chronic lower extremity lymphedema, recurrent wounds on her bilateral lower extremities. She is also a type II diabetic reasonably well controlled with a recent hemoglobin A1c while she was in the hospital of 5.9. She has wounds on her bilateral lower extremities mostly on the left lateral and right lateral. These were noted also during her recent hospitalization. The patient states they have been there for a while. She has not been able to wear her stockings he has trouble getting them on as she lives alone. She also has external compression pumps but uses them sparingly because it causes pain in her knees which hurt because of osteoarthritis. The patient was on doxycycline before she went into the hospital apparently for fear of infection in her legs and is on Keflex coming out because of a UTI The patient was recently in the hospital from 01/06/2019 through 01/09/2019. She was admitted with nausea and vomiting and prerenal azotemia. This was corrected with IV fluid her Lasix has been put on hold. Hemoglobin A1c at 5.9 her metformin was discontinued. She was also felt to have a UTI she was  prescribed Keflex at 503 times a day she is picking that up today. The patient is not felt to have an arterial issue. ABIs in our clinic were 1.24 on the right and 1.06 on the left. She had formal studies in 2018 at which time her ABIs were 1.21 on the right TBI of 0.76 and on the left 1.35 and 0.75 respectively. Waveforms are on the right were monophasic and biphasic, triphasic and biphasic on the left. She is tolerated 4 layer compression in the past 10/13. Comes in today having not used her compression pumps /perhaps once in the last week. She has too much edema to consider healing these wounds. We have been using silver alginate 10/20; she tells me he had she used her compression pumps once a day as we asked. Clearly she has edema out of her legs although most of it seems to be settling around her knees which is what she complains about. We have been using silver alginate to the extensive de-epithelialized area on the left lateral calf and a much smaller area on the right lateral extending linearly into the right posterior 10/27; she is using her compression pumps daily. Her edema control is better. We have been using silver alginate to the extensive wound areas on her bilateral lower legs. This includes left anterior left lateral and left posterior. Right anterior right lateral and right posterior. The wounds are not all in the same condition. She has severe bilateral skin damage with the epithelialization, flaking dried epithelium 11/3; she is using her compression pumps 1 times daily. Pretty obvious that her wraps fell down especially on the left leg to mid calf. I think things are improving on the right lateral calf, I do not see anything open  posteriorly. The major areas on the left lateral where she has 3 or 4 deeper wounds in the midst of an entire de-epithelialized area. There is erythema here but no tenderness I think this represents venous stasis rather than cellulitis. I debrided  the areas on the left lateral calf last week but once again they have tightly adherent debris over the top of them which is disappointing 11/10; the patient's major areas on the left lateral calf to 3 wounds with some depth surrounded by a large area of de-epithelialized tissue. There is no infection. We have been using silver alginate under 4-layer compression. On the right lateral she has a more contained wound area that is superficial. Been using silver alginate under 4-layer compression 11/17; the patient's major wound is on the left lateral calf. We have been using silver alginate under compression. There is no open wound per se on the right but it de-epithelialized area on the lateral calf that is weeping edema fluid. I do not see much change here today. 12/1; patient arrives in clinic with her wounds looking better. Her edema control is better. The lymphedema specialist working on the left leg we have been working on the right. We have been using 4-layer compression. The patient is using a consider external compression pumps and Hydrofera Blue to the open areas. She is being measured for an abdominal compression pump and apparently that is going to go through her insurance 12/15; continued improvement. 2 small areas on the left and slightly larger 2 small areas on the right. 12/22; patient still has open areas on the right lateral calf which are superficial and the new one on the right anterior calf. She did not use her compression pumps this week. Her left leg is wrapped by our lymphedema specialist. There are no open wounds here 12/29; she still has a superficial area on the right lateral calf and area on the right medial calf. Right anterior wound area appears to have closed. She has much better edema control today than last week Electronic Signature(s) Signed: 04/04/2019 6:15:30 PM By: Linton Ham MD Entered By: Linton Ham on 04/04/2019  18:02:01 -------------------------------------------------------------------------------- Physical Exam Details Patient Name: Date of Service: [name] [name], [name] [name] 04/04/2019 3:00 PM Medical Record [id number] Patient Account Number: [id number] Date of Birth/Sex: Treating RN: October 12, 1945 (73 y.o. F) Primary Care Provider: [name] Other Clinician: Referring Provider: Treating Provider/Extender:Kandiss Ihrig, Tenna Child, [place] Siren in Treatment: 12 Constitutional Patient is hypertensive.. Pulse regular and within target range for patient.Marland Kitchen Respirations regular, non-labored and within target range.. Temperature is normal and within the target range for the patient.Marland Kitchen Appears in no distress. Respiratory work of breathing is normal. Cardiovascular But better edema control on the right this week. Integumentary (Hair, Skin) Severe chronic venous insufficiency but no evidence of cellulitis.[facility] appears at normal baseline. Notes Wound exam Everything is closed on the left. Right posterior is closed right anterior is closed she still has a superficial area on the right lateral calf and a more prominent area on the right medial calf. Her edema control in the right leg is a lot better than it was last week Electronic Signature(s) Signed: 04/04/2019 6:15:30 PM By: Linton Ham MD Entered By: Linton Ham on 04/04/2019 18:07:46 -------------------------------------------------------------------------------- Physician Orders Details Patient Name: Date of Service: Tregre, Shaima 04/04/2019 3:00 PM Medical Record [id number] Patient Account Number: [id number] Date of Birth/Sex: Treating RN: [birth year] (73 y.o. [name] Primary Care Provider: [name] Other Clinician: Referring Provider: Treating Provider/Extender:Simra Fiebig, Tenna Child, [place] Siren  in Treatment: 12 Verbal / Phone Orders: No Diagnosis Coding ICD-10 Coding Code  Description I89.0 Lymphedema, not elsewhere classified L97.221 Non-pressure chronic ulcer of left calf limited to breakdown of skin L97.211 Non-pressure chronic ulcer of right calf limited to breakdown of skin E11.622 Type 2 diabetes mellitus with other skin ulcer Follow-up Appointments Return Appointment in 2 weeks. Nurse Visit: - 1 week Skin Barriers/Peri-Wound Care Barrier cream Moisturizing lotion TCA Cream or Ointment Wound Cleansing May shower and wash wound with soap and water. Primary Wound Dressing Wound #28 Right,Lateral Lower Leg Hydrofera Blue - ready Wound #31 Right,Distal,Posterior Lower Leg Hydrofera Blue - ready Wound #37 Right,Anterior Lower Leg Hydrofera Blue - ready Secondary Dressing Wound #28 Right,Lateral Lower Leg ABD pad Wound #31 Right,Distal,Posterior Lower Leg ABD pad Wound #37 Right,Anterior Lower Leg ABD pad Edema Control 4 layer compression - Right Lower Extremity - unna boot at top to help secure wrap Other: - left leg: PT to apply lymphedema wraps Electronic Signature(s) Signed: 04/04/2019 5:43:27 PM By: Carlene Coria RN Signed: 04/04/2019 6:15:30 PM By: Linton Ham MD Entered By: Carlene Coria on 04/04/2019 13:57:38 -------------------------------------------------------------------------------- Problem List Details Patient Name: Date of Service: [name] [name], [name] [name] 04/04/2019 3:00 PM Medical Record [id number] Patient Account Number: [id number] Date of Birth/Sex: Treating RN: 1945/06/02 (73 y.o. [name] [name], [place] Primary Care Provider: [name] Other Clinician: Referring Provider: Treating Provider/Extender:Remee Charley, Tenna Child, [place] Siren in Treatment: 12 Active Problems ICD-10 Evaluated Encounter Code Description Active Date Today Diagnosis I89.0 Lymphedema, not elsewhere classified 01/10/2019 No Yes L97.221 Non-pressure chronic ulcer of left calf limited to 01/10/2019 No Yes breakdown of skin L97.211  Non-pressure chronic ulcer of right calf limited to 01/10/2019 No Yes breakdown of skin E11.622 Type 2 diabetes mellitus with other skin ulcer 01/10/2019 No Yes Inactive Problems Resolved Problems Electronic Signature(s) Signed: 04/04/2019 6:15:30 PM By: Linton Ham MD Previous Signature: 04/04/2019 5:43:27 PM Version By: Carlene Coria RN Entered By: Linton Ham on 04/04/2019 18:00:53 -------------------------------------------------------------------------------- Progress Note Details Patient Name: Date of Service: [name] [name], [name] [name] 04/04/2019 3:00 PM Medical Record [id number] Patient Account Number: [id number] Date of Birth/Sex: Treating RN: 12/14/1945 (73 y.o. F) Primary Care Provider: [name] Other Clinician: Referring Provider: Treating Provider/Extender:Nyeli Holtmeyer, Tenna Child, [place] Siren in Treatment: 12 Subjective History of Present Illness (HPI) 04/09/15; the patient is here for review of wounds on her bilateral lower extremities for the last month. The patient has a history of lymphedema and bilateral chronic venous insufficiency with inflammation she was here for review of wounds on her bilateral legs in 2014 she was discharged home with external compression pumps which she is currently not using. Over the last month she developed wounds on her bilateral lower legs with drainage and pain and she is here for our review of this. 04/16/15; the patient's edema is under much better control. She has 3 areas one on the right anterior medial leg one on the left posterior leg and a small open area with purulent drainage on the left anterior leg. I have cultured the area on the left anterior leg 04/23/15; continued improvement in the patient's edema. All her wounds of closed I see no open areas. Culture from the left anterior leg last time was negative. READMISSION 02/07/16; this patient is a woman that I saw her earlier in 2017. I believe we discharged her  very quickly with bilateral wounds in her legs secondary to chronic venous hypertension with inflammation and secondary lymphedema. She had juxtalite stockings. She has external compression pumps at home from a  stay in our clinic in 2014. She tells me that she's had wounds on her bilateral lower legs since March or April of this year. 3 wounds on the left 2 on the right. She is a type II diabetic and has a history of noncompressible vessels bilaterally although she is tolerated 4-layer compression. The last note from her primary physician Dr. [name] was in May. At that point Dr. [name] had referred the patient here for lymphedema management. The patient stated she made a phone call to year but was not able to arrange an appointment. It is not clear that she is actually using anything on the wounds currently except some gauze that was saturated with drainage per our intake nurse. She is certainly not using her juxtalite stockings and by the patient's own admission she is not using her compression pumps because they are "irritating". I don't see a recent hemoglobin A1c. The patient states she has not been systemically unwell but the legs are painful 02/14/16; patient tolerated her Profore wraps reasonably well. She is using the compression pumps 1 time per day. Measurements of her legs are better in terms of the amount of lymphedema 02/21/16; patient tolerated her Profore wraps it. She says she is using her compression pumps one or 2 times a day. Measurements of her leg circumference are improved and most of her wound dimensions are better, unfortunately the home health that we referred her to did not except her insurance but works successful in getting her accompanied that would except her insurance however she apparently has a $25 co-pay which she cannot afford as she is on a fixed income 03/05/16; patient states she did not back using the pumps. She is healed that 2 wounds on the  left leg. She still has areas on the right lateral lower leg, right medial lower leg and left lateral lower leg. All of these look some better and have reduced in area 03/13/16; still 3 wounds 1 on the left lateral leg, one on the left medial/posterior leg which is really the most extensive and a small area on the right lateral leg. All of this in the setting of severe chronic lymphedema and chronic venous inflammation. Damage to the skin with cobblestone thickened skin. 03/20/16; still 2 areas. The area on the left lateral leg appears to of closed over. The right medial leg is still most extensive wound although it appears to be closing over right lateral only a small open area remains. Using silver alginate Profore wraps 03/27/16; the area on the left leg remains closed. The edema control is better on the left than the right. The right medial leg is still weeping but everything appears to be epithelialized. The lateral aspect of the right leg appears closed. She tells me she has old juxtalite stockings. She has been compliant with her compression pumps. 04/03/16; the area on the left leg remains closed. She has ordered new juxtalite stockings. The right posterior medial wound is fully epithelialized. However she has an irritated area over the right Achilles that she states is been itching all week under the wraps.. She has been compliant with her compression pumps 04/10/16; the area on the left leg remains closed. The right posterior medial wound is also closed today. The irritated area over the right Achilles which was probably wrap injury is also fully epithelialized and closed. The patient has not received her new juxtalite stockings, she arrives in clinic today with an old juxtalite stocking in place. Sheis using her  compression pumps secondary to her severe stage III lymphedema READMISSION 01/21/17; this is a patient we know from several previous stays in this clinic. She has lymphedema with  chronic venous insufficiency and inflammation. The last time she was here we discharged her with bilateral juxta lites which she is compliant with. She also has external compression pumps at home from stays in our clinic in 2014 although she tells me that she is not very compliant with these as when she uses them a causes pain in her bilateral knees the patient states that her wounds on her left greater than right leg including 2 large areas on the left anterior and left lateral leg and an area on the right leg open in late June or July since then she is been followed by [facility] and she's had bilateral Unna boot supplied every week for the last month.. I think the wounds have actually improved although they are not specifically dressed. She states she had an ultrasound to rule out DVT bilaterally that was negative. She does not have arterial studies although she is a diabetic. She states she has a lot of pain in her legs she states she does not use her external compression pumps because it causes pain in her knees. As usual her ABIs were noncompressible bilaterally in this clinic 01/28/17; currently the patient only has one small wound on the left lateral leg and I think this should be healed next week. We are going to try to get her bilateral extremitease stockings. She tells me that compression pumps heard her posterior rib arthritic knees so she is not been compliant with this 02/04/17; the patient's wounds are totally healed. She has chronic venous insufficiency and secondary lymphedema. She has new extremitease stockings. She also has compression pumps Readmission: 04/21/17 on evaluation today patient appears to be doing about the same as what she was previous when we were evaluating her back in November 2018. At that point she had completely healed. With that being said she has now reopened and states that she wasn't aware that she was supposed to come back. 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 Subsequently she also states that although she has the compression that we got for her previously the extremitease stockings she nonetheless doesn't always wear these and being noncompliant often has things will reopen as far as ulcers on her lower extremities. No fevers, chills, nausea, or vomiting noted at this time. Patient has no evidence of dementia. She is having some discomfort in regard to the ulcers at this point. 04/28/17 on evaluation today patient appears to be doing significantly better in regard to her wounds. With that being said she is still having significant discomfort and is not really keen on sharp debridement. She does not appear to have any evidence of infection which is good news I do think the Iodoflex is beneficial for her. Of note her ABI's were found in the system to be on the right 1.21 with a TBI of 0.76 and on the left 1.35 with a TBI of 0.75. In general her swelling appears to be significantly improved however. No fevers, chills, nausea, or vomiting noted at this time. Patient has been tolerating the wraps without complication. She has no evidence of dementia. 05/05/17-she is here in follow-up evaluation for bilateral lower extremity venous and lymphedema ulcers. She states she has not been using her  lymphedema pumps secondary to pain at her knees. After discussion it was noted that she uses knee-high lymphedema pumps but does have a pair of thigh-high lymphedema pumps. She was strongly advised to use the thigh-high lymphedema pumps and if necessary reduce the setting for improved tolerance. She reports no change in odor and/or drainage. She continues to tolerate sharp debridement poorly with significant pain, primarily to the right lower extremity. We will continue with current treatment plan, along with improved lymphedema pump use and follow-up  next week 05/12/17 on evaluation today patient actually appears to be doing much better in regard to her bilateral lower extremity ulcers. Only the right altar actually requires debridement today. The other two cleaned off nicely in two of the three in fact on the left lower extremity or actually almost completely healed. She does have some weeping noted still the bilateral lower extremities left greater than right. She still is not able to use her compression pumps even though I have mentioned this several times to her as did Leah last week. No fevers, chills, nausea, or vomiting noted at this time. Patient has been tolerating the dressing changes without complication. Patient has no evidence of dementia 05/19/17 on evaluation today patient appears to be doing very well regard to her bilateral lower extremity ulcers. In fact she seems to be improving and in fact healed in a couple of locations that were giving her trouble last week. Overall I'm extremely pleased with how things have progressed. She still has some discomfort especially in the right lateral lower extremity. Fortunately there does not appear to be any evidence of significant infection which is good news she does tell me that she has used her compression pumps several times although not routinely over the past week I did praise her for this. I do think it will be beneficial and that might even be what's helping with her wounds as far as healing is concerned at least to a degree. No fevers, chills, nausea, or vomiting noted at this time. Patient has no dementia and no evidence of infection. 05/26/17 on evaluation today patient presents for evaluation concerning her bilateral lower extremity ulcer secondary to lymphedema. Fortunately she appears to be doing very well at this point in time. Specifically her right leg appears to be completely closed and healed. The left leg though there is still a small area of opening overall has healed. She  does have a little bit of excoriation at the top or the wraps are because they slid down on her. Otherwise there's no evidence of infection and the other has improved dramatically now that she is not draining as significantly. Patient is having no significant pain she tells me she has been able to use her lymphedema pumps one time a day although that's not all she can tolerate. Even that is difficult for her. 06/02/17 on evaluation today patient's ulcers appear to be completely closed at this point. She does still note some odor but I believe this is due to the fact she has not been able to wash her legs as well currently. Fortunately she did bring her compression with her today. She does have the EXTREMIT-EASE Compression. With that being said she is somewhat nervous about going back to this as previously she broke out yet again once we discontinue wrapping her. READMISSION 11/23/17; patient is now 73 year old diabetic woman with severe chronic venous insufficiency with lymphedema. We care for her last in this clinic in February 2019 at which time she  had chronic venous insufficiency with lymphedema bilateral lower extremity ulcers. We discharged her with bilateral wrap around/extremitease stockings and compression pumps. Is fairly clear she is not using the compression pumps stating it causes pain in her knees. In any case she was admitted to hospital from 8/3 through 8/6 with a several week history of recurrent bilateral ulcers. She was felt to have bilateral cellulitis treated with doxycycline IV. She is still on oral doxycycline and has another 2 days worth. At discharge her white count was 14.1 hemoglobin at 8.9. The patient is a diabetic however her last ABIs in 2018 showed a right ABI of 1.21, left of 1.35. TBI's were 0.76 on the right 0.75 on the left. Posterior tibial artery was monophasic on the right triphasic on the left. Dorsalis pedis triphasic on the right and triphasic on the left.  She is not felt to have significant macrovascular disease 12/02/17;significant bilateral lower extremity wounds secondary to severe uncontrolled lymphedema. She did not get home health out to change the wraps. So she kept this on all week. She is telling me she using his her compression pumps once a week. In general all the wounds look better. We've been using silver alginate with secondary absorptive dressings 4 layer compression 12/10/17; bilateral lower extremity wounds secondary to uncontrolled lymphedema. She tells me she is using her compression pumps on most days. We've been keeping wraps on all week. Using silver alginate. She has wounds on the right lateral calf left medial and left lateral calf. All of this is somewhat better 12/16/17; the patient has 3 small open areas. One on the right lateral calf, one on the left medial calf and the small area on the left lateral calf. She has lymphedema, chronic venous insufficiency with hemosiderin deposition. Very fibrotic distal lower extremity scan with suggestion of an inverted bottle sign appearance to her legs. She has compression pumps but she does not use them has not used them in the last week. Currently we have her in 4 layer compression, silver alginate to the wounds and this is being changed once by home health. She is noncompliant with the compression pumps because she says it hurts her knees. She also has compression stockings ando Juxta light stockings at home 12/24/17; the patient's right lateral calf is healed as well as the left medial. There is no open wound on the right leg. She still has 2 areas on the left lateral calf. We transitioned the right leg into an extremitease stocking which she brought in from home. 12/31/17; the patient has a superficial wound on the left lateral lower calf. We've been wrapping this leg. She arrives today with the right leg swollen but without a wound. She cannot get her extremities stocking on the right  leg. There is a lot more edema here. She is not using her compression pumps 01/10/2018; patient has a same superficial wound on the left lateral calf. Her right leg has less peripheral edema. She tells me she also started herself on some Lasix that had been previously prescribed for her at some point but she had never taken it. She is also concerned about whether her compression pumps are leaking. She has extremitease stockings however the edema gets bad enough she can get these on. Currently she has no open wound on the right leg and is mention the edema is actually better than when I saw this 10 days ago 01/17/2018; patient has a same superficial area on the posterior left calf and a  weeping area in the mid part of the right lateral calf. She still does not have anyone from [facility] coming to see her compression pumps which she describes as being suspicious for a leakage. I am not really sure how frequently she is using this in any case. She has massive bilateral calf lymphedema. I think there is some spreading lymphedema into her posterior thighs. 01/24/2018; the patient's edema in her legs is actually some better. She still has a smaller area on the left posterior calf although this is better. The weeping area on the right lateral calf is also better. As it turned out she did not get her pumps from [facility] but [facility] and they are going to try to go out and see these this week which is going to help. I explained to her that she will use the compression pumps once a day while we are wrapping her legs but when she transitions to stockings that may be she will require pumping twice a day. Her compliance in this area has not been high and I wonder about her ability to do this herself. 02/01/2018; the area on the posterior left leg is healed. She still has the original wound on the right lateral calf which is epithelialized. The however just above this there is still  weeping lymphedema fluid. She has a new area on the upper right calf which is either wrap injury or is she points out where she has been scratching under the wraps. We still not have managed to get a hold of a representative of [facility] to see if the pump is operational. She has extremitease stockings at home however I am not of the opinion that this will maintain her skin integrity without the compression pumps. 02/08/2018; posterior left leg remains healed although she has a new small open area on the left lateral calf. Right lateral calf has two quarter sized open areas and close juxtaposition. There is less weeping edema fluid. She apparently managed to contact [facility]. They are sending her a part. But she is still not received it 02/15/2018; the patient has no open wound on either leg. Some scale present on the right upper lateral calf just below the fibular head. Edema control is excellent. She did not bring her stockings or her juxta lite wrap around. She has the part for her compression pump but she has not use the compression pumps yet READMISSION 01/10/2019 Patient is now a 73 year old woman that we have had in the clinic multiple times in the past. She has chronic lower extremity lymphedema, recurrent wounds on her bilateral lower extremities. She is also a type II diabetic reasonably well controlled with a recent hemoglobin A1c while she was in the hospital of 5.9. She has wounds on her bilateral lower extremities mostly on the left lateral and right lateral. These were noted also during her recent hospitalization. The patient states they have been there for a while. She has not been able to wear her stockings he has trouble getting them on as she lives alone. She also has external compression pumps but uses them sparingly because it causes pain in her knees which hurt because of osteoarthritis. The patient was on doxycycline before she went into the hospital  apparently for fear of infection in her legs and is on Keflex coming out because of a UTI The patient was recently in the hospital from 01/06/2019 through 01/09/2019. She was admitted with nausea and vomiting and prerenal azotemia. This  was corrected with IV fluid her Lasix has been put on hold. Hemoglobin A1c at 5.9 her metformin was discontinued. She was also felt to have a UTI she was prescribed Keflex at 503 times a day she is picking that up today. The patient is not felt to have an arterial issue. ABIs in our clinic were 1.24 on the right and 1.06 on the left. She had formal studies in 2018 at which time her ABIs were 1.21 on the right TBI of 0.76 and on the left 1.35 and 0.75 respectively. Waveforms are on the right were monophasic and biphasic, triphasic and biphasic on the left. She is tolerated 4 layer compression in the past 10/13. Comes in today having not used her compression pumps /perhaps once in the last week. She has too much edema to consider healing these wounds. We have been using silver alginate 10/20; she tells me he had she used her compression pumps once a day as we asked. Clearly she has edema out of her legs although most of it seems to be settling around her knees which is what she complains about. We have been using silver alginate to the extensive de-epithelialized area on the left lateral calf and a much smaller area on the right lateral extending linearly into the right posterior 10/27; she is using her compression pumps daily. Her edema control is better. We have been using silver alginate to the extensive wound areas on her bilateral lower legs. This includes left anterior left lateral and left posterior. Right anterior right lateral and right posterior. The wounds are not all in the same condition. She has severe bilateral skin damage with the epithelialization, flaking dried epithelium 11/3; she is using her compression pumps 1 times daily. Pretty obvious that her  wraps fell down especially on the left leg to mid calf. I think things are improving on the right lateral calf, I do not see anything open posteriorly. The major areas on the left lateral where she has 3 or 4 deeper wounds in the midst of an entire de-epithelialized area. There is erythema here but no tenderness I think this represents venous stasis rather than cellulitis. I debrided the areas on the left lateral calf last week but once again they have tightly adherent debris over the top of them which is disappointing 11/10; the patient's major areas on the left lateral calf to 3 wounds with some depth surrounded by a large area of de-epithelialized tissue. There is no infection. We have been using silver alginate under 4-layer compression. On the right lateral she has a more contained wound area that is superficial. Been using silver alginate under 4-layer compression 11/17; the patient's major wound is on the left lateral calf. We have been using silver alginate under compression. There is no open wound per se on the right but it de-epithelialized area on the lateral calf that is weeping edema fluid. I do not see much change here today. 12/1; patient arrives in clinic with her wounds looking better. Her edema control is better. The lymphedema specialist working on the left leg we have been working on the right. We have been using 4-layer compression. The patient is using a consider external compression pumps and Hydrofera Blue to the open areas. She is being measured for an abdominal compression pump and apparently that is going to go through her insurance 12/15; continued improvement. 2 small areas on the left and slightly larger 2 small areas on the right. 12/22; patient still has open  areas on the right lateral calf which are superficial and the new one on the right anterior calf. She did not use her compression pumps this week. Her left leg is wrapped by our lymphedema specialist. There  are no open wounds here 12/29; she still has a superficial area on the right lateral calf and area on the right medial calf. Right anterior wound area appears to have closed. She has much better edema control today than last week Objective Constitutional Patient is hypertensive.. Pulse regular and within target range for patient.Marland Kitchen Respirations regular, non-labored and within target range.. Temperature is normal and within the target range for the patient.Marland Kitchen Appears in no distress. Vitals Time Taken: 4:00 PM, Height: 71 in, Weight: 240 lbs, BMI: 33.5, Temperature: 97.9 F, Pulse: 58 bpm, Respiratory Rate: 20 breaths/min, Blood Pressure: 155/65 mmHg. Respiratory work of breathing is normal. Cardiovascular But better edema control on the right this week. Psychiatric appears at normal baseline. General Notes: Wound exam ooEverything is closed on the left. Right posterior is closed right anterior is closed she still has a superficial area on the right lateral calf and a more prominent area on the right medial calf. Her edema control in the right leg is a lot better than it was last week Integumentary (Hair, Skin) Severe chronic venous insufficiency but no evidence of cellulitis.. Wound #28 status is Open. Original cause of wound was Gradually Appeared. The wound is located on the Right,Lateral Lower Leg. The wound measures 0.8cm length x 1cm width x 0.1cm depth; 0.628cm^2 area and 0.063cm^3 volume. There is Fat Layer (Subcutaneous Tissue) Exposed exposed. There is no tunneling or undermining noted. There is a small amount of serous drainage noted. The wound margin is flat and intact. There is large (67-100%) pink granulation within the wound bed. There is no necrotic tissue within the wound bed. Wound #31 status is Healed - Epithelialized. Original cause of wound was Gradually Appeared. The wound is located on the Right,Distal,Posterior Lower Leg. The wound measures 0cm length x 0cm width x 0cm  depth; 0cm^2 area and 0cm^3 volume. There is no tunneling or undermining noted. There is a none present amount of drainage noted. The wound margin is flat and intact. There is no granulation within the wound bed. There is no necrotic tissue within the wound bed. Wound #37 status is Healed - Epithelialized. Original cause of wound was Gradually Appeared. The wound is located on the Right,Anterior Lower Leg. The wound measures 0cm length x 0cm width x 0cm depth; 0cm^2 area and 0cm^3 volume. There is no tunneling or undermining noted. There is a none present amount of drainage noted. The wound margin is flat and intact. There is no granulation within the wound bed. There is no necrotic tissue within the wound bed. 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 There was a Four Layer Compression Therapy Procedure by Carlene Coria, RN. Post procedure Diagnosis Wound #28: Same as Pre-Procedure Plan Follow-up Appointments: Return Appointment in 2 weeks. Nurse Visit: - 1 week Skin Barriers/Peri-Wound Care: Barrier cream Moisturizing lotion TCA Cream or Ointment Wound Cleansing: May shower and wash wound with soap and water. Primary Wound Dressing: Wound #28 Right,Lateral Lower Leg: Hydrofera Blue - ready Wound #  [place] Leg: Hydrofera Blue - ready Wound #37 Right,Anterior Lower Leg: Hydrofera Blue - ready Secondary Dressing: Wound #28 Right,Lateral Lower Leg: ABD pad Wound #31 Right,Distal,Posterior Lower Leg: ABD pad Wound #37 Right,Anterior Lower Leg: ABD pad Edema Control: 4 layer compression - Right Lower Extremity - unna boot at top  to help secure wrap Other: - left leg: PT to apply lymphedema wraps 1. Hydrofera Blue to continue under 4-layer compression 2. Still using her external compression pumps Electronic Signature(s) Signed: 04/04/2019 6:15:30 PM By: Linton Ham MD Entered By: Linton Ham on 04/04/2019 18:08:59 -------------------------------------------------------------------------------- SuperBill Details Patient Name: Date of Service: LAGRETTA, [name] [name] 04/04/2019 Medical Record [id number] Patient Account Number: [id number] Date of Birth/Sex: Treating RN: 1945-07-01 (73 y.o. [name] Primary Care Provider: [name] Other Clinician: Referring Provider: Treating Provider/Extender:Jasara Corrigan, Tenna Child, [place] Siren in Treatment: 12 Diagnosis Coding ICD-10 Codes Code Description I89.0 Lymphedema, not elsewhere classified L97.221 Non-pressure chronic ulcer of left calf limited to breakdown of skin L97.211 Non-pressure chronic ulcer of right calf limited to breakdown of skin E11.622 Type 2 diabetes mellitus with other skin ulcer Facility Procedures CPT4 Code Description: [id number] (Facility Use Only) [phone] - APPLY MULTLAY COMPRS LWR RT LEG Modifier: Quantity: 1 Physician Procedures CPT4 Code Description: [id number] [place] - WC PHYS LEVEL 3 - EST PT ICD-10 Diagnosis Description I89.0 Lymphedema, not elsewhere classified L97.211 Non-pressure chronic ulcer of right calf limited to br E11.622 Type 2 diabetes mellitus with other skin ulcer Modifier: eakdown of skin Quantity: 1 Electronic Signature(s) Signed: 04/04/2019 6:15:30 PM By: Linton Ham MD Previous Signature: 04/04/2019 5:43:27 PM Version By: Carlene Coria RN Entered By: Linton Ham on 04/04/2019 18:09:36

## 2019-04-04 NOTE — Therapy (Signed)
[place], [place], [address] Phone: [phone]   Fax:  [phone]  Physical Therapy Progress Note  Progress Note Reporting Period 02/21/19 to 04/04/19  See note below for Objective Data and Assessment of Progress/Goals.       Patient Details  Name: [name] [name] MRN: [id number] Date of Birth: [birth year] Referring Provider (PT): Darci Current   Encounter Date: 04/04/2019  PT End of Session - 04/04/19 1515    Visit Number  10    Number of Visits  25    Date for PT Re-Evaluation  05/09/19    PT Start Time  1410    PT Stop Time  1453    PT Time Calculation (min)  43 min    Activity Tolerance  Patient tolerated treatment well    Behavior During Therapy  [facility] for tasks assessed/performed       Past Medical History:  Diagnosis Date  . Acute kidney injury ([place])   . Aortic stenosis, mild 11/17/2013  . Arthritis    "both knees, spine, back, fingers" (11/29/2013)  . CHF (congestive heart failure) ([place])   . Edema   . GERD (gastroesophageal reflux disease)   . Heart murmur   . History of diastolic dysfunction    Echo 09/5463 with diastolic dysfunction  . HTN (hypertension)   . Morbid obesity ([place])   . OA (osteoarthritis)   . PAC (premature atrial contraction)    per prior Holter  . Palpitations   . Pneumonia    "as a child"  . PVC's (premature ventricular contractions)    per prior Holter  . SVT (supraventricular tachycardia) ([place])   . Tachycardia    noted at 07/28/11 visit. Started on beta blocker. Possible atrial flutter vs long PR tachycardia/AVNRT  . Type II diabetes mellitus ([place])     Past Surgical History:  Procedure Laterality Date  . CESAREAN SECTION  1985  . CESAREAN SECTION  1985  . SUPRAVENTRICULAR TACHYCARDIA ABLATION  11/29/2013  . SUPRAVENTRICULAR TACHYCARDIA ABLATION N/A 11/29/2013   Procedure: SUPRAVENTRICULAR TACHYCARDIA ABLATION;  Surgeon: Evans Lance, MD;   Location: [facility] CATH LAB;  Service: Cardiovascular;  Laterality: N/A;  . US ECHOCARDIOGRAPHY  07/25/2008   EF 55-60%  . VAGINAL DELIVERY      There were no vitals filed for this visit.  Subjective Assessment - 04/04/19 1513    Pertinent History  osteo arthritis, hx cellulitis, phlebitis and history of edema in BLE, Hx Acute kidney injury, CHF and DM II    Patient Stated Goals  I want to get this swelling down and heal my wound.    Currently in Pain?  Yes    Pain Score  7     Pain Location  Knee    Pain Orientation  Left;Right    Pain Descriptors / Indicators  Aching    Pain Type  Chronic pain    Pain Onset  More than a month ago    Aggravating Factors   movement    Pain Relieving Factors  rest                       [facility] Adult PT Treatment/Exercise - 04/04/19 0001      Manual Therapy   Manual Therapy  Edema management;Compression Bandaging    Manual therapy comments  Pt has no wounds noted on the L lower leg. Desquamation continues at the lower leg and large pieces fall off naturally without physical  therapist intervention. Pt was washed with soap and warm water and Triamcinolone acetonide and moisturizer was used prior to donning compression bandage per MD order. No wound dressings were performed this session and no drainage was present on removing the wrap.     Edema Management  Assessed pt juxtalite which is very old and the velcro is no longer viable. Pt will wear this once she removes her wrap after 2-3 days before her next appointment next week due to the holidays. Pt will continue with tubigrip over her R thigh and knee    Compression Bandaging  Pt was wrapped with 2 artiflex for shaping and 1 10 cm roman sandal, 2 10cm for ankle sole heel and then spiral wrap to below the knee. Discussed wearing tubigrip at the knee/thigh when she gets home.                PT Short Term Goals - 04/04/19 1515      PT SHORT TERM GOAL #1   Title  Pt will be independent with  HEP    Baseline  Pt is working on her HEP occasionally at home.    Time  5    Period  Weeks    Status  On-going        PT Long Term Goals - 04/04/19 1515      PT LONG TERM GOAL #1   Title  Patient will have reduction of L lower leg limb girth by 3 cm    Baseline  Pt will be measuremed next session she has significantly reduced from her initial evaluation.    Time  5    Status  On-going    Target Date  05/23/19      PT LONG TERM GOAL #2   Title  Patient to be properly fitted with compression garment to wear on daily basis.    Baseline  Pt has a juxtalite that is too small and old that ht evelcro is no longer usable on. Trying to get her a [employer].    Time  5    Period  Weeks    Status  On-going    Target Date  05/23/19      PT LONG TERM GOAL #3   Title  Patient will be independent in self-care management principles including self-massage/bandaging and long term management plan for edema.    Baseline  Pt has difficulty with managing her lymphedema. Trying to ger her a pump to help with swelling.    Time  5    Period  Weeks    Status  On-going    Target Date  05/23/19            Plan - 04/04/19 1514    Clinical Impression Statement  Pt presents today with her wrap still on from her last session 1 week ago. She had removed the wrap over the thigh and knee but left the lower leg wrap in place. She did not have tubigrip over her knee/thigh and slight increase in swelling in this area. Pt is making progress with her goals and progressing but has not met her goals at this time. The wounds on her L lower leg which we have been treating have healed up and she has had a significant reduction in girth. We are waiting on insurance for a vasopneumatic pump and pt is getting finances ready to purchase the correct velcro wrap for her Bil LE in order to decrease risk for infection, re-occurance of  ulcers and risk for immobility. Pt will benefit from continued skilled physical therapy  services in order to address the above limitations 2x/week for 5 weeks.    Personal Factors and Comorbidities  Age;Comorbidity 3+    Comorbidities  CHF, acute kidney injury, DM II    PT Frequency  2x / week    PT Duration  12 weeks    PT Treatment/Interventions  Electrical Stimulation;Cryotherapy;Gait training;Stair training;Functional mobility training;Therapeutic activities;Therapeutic exercise;Balance training;Neuromuscular re-education;Patient/family education;Manual lymph drainage;Compression bandaging;Passive range of motion;Taping;Vasopneumatic Device    PT Next Visit Plan  , Assess wrap, give exercises, assess self MLD and possible work out pants?    PT Home Exercise Plan  ask about pump, provide exercises    Consulted and Agree with Plan of Care  Patient       Patient will benefit from skilled therapeutic intervention in order to improve the following deficits and impairments:  Difficulty walking, Increased edema  Visit Diagnosis: Lymphedema  Other abnormalities of gait and mobility     Problem List Patient Active Problem List   Diagnosis Date Noted  . Postnasal drip 11/16/2017  . Sensorineural hearing loss (SNHL), bilateral 11/16/2017  . Tinnitus of both ears 11/16/2017  . Obesity, Class II, BMI 35-39.9, isolated (see actual BMI) 11/08/2017  . CKD (chronic kidney disease), stage III 11/08/2017  . Anemia 11/07/2017  . Thrombocytosis ([place]) 11/07/2017  . Lymphedema 11/07/2017  . Routine general medical examination at a health care facility 05/10/2014  . Paroxysmal SVT (supraventricular tachycardia) ([place]) 11/28/2013  . Physical deconditioning 11/23/2013  . Generalized weakness 11/17/2013  . Nausea 11/17/2013  . Multilevel spine pain 11/17/2013  . Aortic stenosis, mild 11/17/2013  . DM type 2 (diabetes mellitus, type 2) ([place]) 02/02/2013  . Arthritis 10/19/2012  . Mixed incontinence 10/19/2012  . GERD (gastroesophageal reflux disease) 04/20/2011  . HTN (hypertension)  01/20/2011  . Morbid obesity [facility])     Ander Purpura, PT 04/04/2019, 3:29 PM  [place], [place], [address] Phone: [phone]   Fax:  [phone]  Name: [name] [name] MRN: [id number] Date of Birth: 03/23/1946

## 2019-04-06 ENCOUNTER — Encounter (HOSPITAL_BASED_OUTPATIENT_CLINIC_OR_DEPARTMENT_OTHER): Payer: PPO | Admitting: Internal Medicine

## 2019-04-06 DIAGNOSIS — I890 Lymphedema, not elsewhere classified: Secondary | ICD-10-CM | POA: Diagnosis not present

## 2019-04-07 ENCOUNTER — Other Ambulatory Visit: Payer: Self-pay | Admitting: Internal Medicine

## 2019-04-10 NOTE — Progress Notes (Signed)
[name] [name] ([id number]) Visit Report for 04/04/2019 Arrival Information Details Patient Name: Date of Service: [name], [name] 04/04/2019 3:00 PM Medical Record [id number] Patient Account Number: [id number] Date of Birth/Sex: Treating RN: 25-Mar-1946 (74 y.o. [name] Primary Care Midori Dado: [name] Other Clinician: Referring Herron Fero: Treating Jacquita Mulhearn/Extender:Robson, Tenna Child, [place] Siren in Treatment: 12 Visit Information History Since Last Visit Walker Added or deleted any medications: No Patient Arrived: Any new allergies or adverse reactions: No Arrival Time: 16:01 Had a fall or experienced change in No Accompanied By: alone activities of daily living that may affect Transfer Assistance: None risk of falls: Patient Identification Verified: Yes Signs or symptoms of abuse/neglect since last No Secondary Verification Process Completed: Yes visito Patient Requires Transmission-Based No Hospitalized since last visit: No Precautions: Implantable device outside of the clinic excluding No Patient Has Alerts: No cellular tissue based products placed in the center since last visit: Has Dressing in Place as Prescribed: Yes Has Compression in Place as Prescribed: Yes Pain Present Now: No Electronic Signature(s) Signed: 04/06/2019 3:10:57 PM By: Levan Hurst RN, BSN Entered By: Levan Hurst on 04/04/2019 16:02:04 -------------------------------------------------------------------------------- Compression Therapy Details Patient Name: Date of Service: [name] [name] 04/04/2019 3:00 PM Medical Record [id number] Patient Account Number: [id number] Date of Birth/Sex: Treating RN: [birth year] (74 y.o. [name] Primary Care Revis Whalin: [name] Other Clinician: Referring Yordi Krager: Treating Blessing Zaucha/Extender:Robson, Tenna Child, [place] Siren in Treatment: 12 Compression Therapy Performed for Wound  Wound #28 Right,Lateral Lower Leg Assessment: Performed By: Clinician Carlene Coria, RN Compression Type: Four Layer Post Procedure Diagnosis Same as Pre-procedure Electronic Signature(s) Signed: 04/04/2019 5:43:27 PM By: Carlene Coria RN Entered By: Carlene Coria on 04/04/2019 16:55:01 -------------------------------------------------------------------------------- Encounter Discharge Information Details Patient Name: Date of Service: [name], [name] 04/04/2019 3:00 PM Medical Record [id number] Patient Account Number: [id number] Date of Birth/Sex: Treating RN: 04/06/1946 (74 y.o. Clearnce Sorrel Primary Care Decarla Siemen: [name] Other Clinician: Referring Ayonna Speranza: Treating Shaine Mount/Extender:Robson, Tenna Child, [place] Siren in Treatment: 12 Encounter Discharge Information Items Discharge Condition: Stable Ambulatory Status: Walker Discharge Destination: Home Transportation: Other Accompanied By: self Schedule Follow-up Appointment: Yes Clinical Summary of Care: Patient Declined Electronic Signature(s) Signed: 04/10/2019 4:32:10 PM By: Kela Millin Entered By: Kela Millin on 04/04/2019 17:15:16 -------------------------------------------------------------------------------- Lower Extremity Assessment Details Patient Name: Date of Service: [name], [name] 04/04/2019 3:00 PM Medical Record [id number] Patient Account Number: [id number] Date of Birth/Sex: Treating RN: [birth year] (74 y.o. [name] Primary Care Sydelle Sherfield: [name] Other Clinician: Referring Nadiyah Zeis: Treating Nghia Mcentee/Extender:Robson, Tenna Child, [place] Siren in Treatment: 12 Edema Assessment Assessed: [Left: No] [Right: No] Edema: [Left: Ye] [Right: s] Calf Left: Right: Point of Measurement: 30 cm From Medial Instep cm 52.8 cm Ankle Left: Right: Point of Measurement: 8 cm From Medial Instep cm 27.8 cm Vascular  Assessment Pulses: Dorsalis Pedis Palpable: [Right:Yes] Electronic Signature(s) Signed: 04/06/2019 3:10:57 PM By: Levan Hurst RN, BSN Entered By: Levan Hurst on 04/04/2019 16:05:56 -------------------------------------------------------------------------------- Multi Wound Chart Details Patient Name: Date of Service: [name], [name] 04/04/2019 3:00 PM Medical Record [id number] Patient Account Number: [id number] Date of Birth/Sex: Treating RN: [birth year] (74 y.o. F) Primary Care Dj Senteno: [name] Other Clinician: Referring Kallin Henk: Treating Shital Crayton/Extender:Robson, Tenna Child, [place] Siren in Treatment: 12 Vital Signs Height(in): 71 Pulse(bpm): 58 Weight(lbs): 240 Blood Pressure(mmHg): 155/65 Body Mass Index(BMI): 33 Temperature(F): 97.9 Respiratory 20 Rate(breaths/min): Photos: [28:No Photos] [31:No Photos] [37:No Photos] Wound Location: [28:Right Lower Leg - Lateral] [31:Right Lower Leg - Posterior, Right Lower Leg - Anterior Distal] Wounding Event: [28:Gradually Appeared] [31:Gradually Appeared] [37:Gradually  Appeared] Primary Etiology: [28:Lymphedema] [31:Lymphedema] [37:Lymphedema] 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] [31:Lower Extremity] Comorbid History: [28:Cataracts, Lymphedema, Cataracts, Lymphedema, Cataracts, Lymphedema, Arrhythmia, Congestive Heart Failure, Hypertension, Heart Failure, Hypertension, Heart Failure, Hypertension, Peripheral Venous Disease, Peripheral Venous Disease,  Peripheral Venous Disease, Type II Diabetes, Osteoarthritis, Neuropathy, Osteoarthritis, Neuropathy, Osteoarthritis, Neuropathy, Confinement Anxiety] [31:Arrhythmia, Congestive Type II Diabetes, Confinement Anxiety] [37:Arrhythmia, Congestive Type II  Diabetes, Confinement Anxiety] Date Acquired: [28:08/05/2018] [31:08/05/2018] [37:03/28/2019] Weeks of Treatment: [28:12] [31:12] [37:1] Wound  Status: [28:Open] [31:Healed - Epithelialized] [37:Healed - Epithelialized] Measurements L x W x D [28:0.8x1x0.1] [31:0x0x0] [37:0x0x0] (cm) Area (cm) : [28:0.628] [31:0] [37:0] Volume (cm) : [28:0.063] [31:0] [37:0] % Reduction in Area: [28:98.20%] [31:100.00%] [37:100.00%] % Reduction in Volume: [28:98.20%] [31:100.00%] [37:100.00%] Classification: [28:Full Thickness Without Exposed Support Structures Exposed Support Structures Exposed Support Structures] [31:Full Thickness Without] [37:Full Thickness Without] Exudate Amount: [28:Small] [31:None Present] [37:None Present] Exudate Type: [28:Serous] [31:N/A] [37:N/A] Exudate Color: [28:amber] [31:N/A] [37:N/A] Wound Margin: [28:Flat and Intact] [31:Flat and Intact] [37:Flat and Intact] Granulation Amount: [28:Large (67-100%)] [31:None Present (0%)] [37:None Present (0%)] Granulation Quality: [28:Pink] [31:N/A] [37:N/A] Necrotic Amount: [28:None Present (0%)] [31:None Present (0%)] [37:None Present (0%)] Exposed Structures: [28:Fat Layer (Subcutaneous Fascia: No Tissue) Exposed: Yes Fascia: No Tendon: No Muscle: No Joint: No Bone: No] [31:Fat Layer (Subcutaneous Fat Layer (Subcutaneous Tissue) Exposed: No Tendon: No Muscle: No Joint: No Bone: No] [37:Fascia: No Tissue)  Exposed: No Tendon: No Muscle: No Joint: No Bone: No] Epithelialization: [28:Medium (34-66%) Compression Therapy] [31:Large (67-100%) N/A] [37:Large (67-100%) N/A] Treatment Notes Wound #28 (Right, Lateral Lower Leg) 1. Cleanse With Wound Cleanser Soap and water 2. Periwound Care TCA Cream 3. Primary Dressing Applied Hydrofera Blue 4. Secondary Dressing ABD Pad 6. Support Layer Applied 4 layer compression wrap Notes stocking. wound care wrapped right leg and lymphedema specialist wrapped left leg Electronic Signature(s) Signed: 04/04/2019 6:15:30 PM By: Linton Ham MD Entered By: Linton Ham on 04/04/2019  18:01:09 -------------------------------------------------------------------------------- Multi-Disciplinary Care Plan Details Patient Name: Date of Service: [name], [name] 04/04/2019 3:00 PM Medical Record [id number] Patient Account Number: [id number] Date of Birth/Sex: Treating RN: 04-12-1945 (75 y.o. [name] Primary Care Gahel Safley: [name] Other Clinician: Referring Tamesha Ellerbrock: Treating Linsie Lupo/Extender:Robson, Tenna Child, [place] Siren in Treatment: 12 Active Inactive Wound/Skin Impairment Nursing Diagnoses: Knowledge deficit related to ulceration/compromised skin integrity Goals: Patient/caregiver will verbalize understanding of skin care regimen Date Initiated: 01/10/2019 Target Resolution Date: 04/14/2019 Goal Status: Active Ulcer/skin breakdown will have a volume reduction of 30% by week 4 Target Resolution Date Initiated: 01/10/2019 Date Inactivated: 02/14/2019 Date: 02/10/2019 Unmet Goal Status: Unmet Reason: comorbities/lymphedema Ulcer/skin breakdown will have a volume reduction of 50% by week 8 Target Resolution Date Initiated: 02/14/2019 Date Inactivated: 03/21/2019 Date: 03/17/2019 Unmet Goal Status: Unmet Reason: comorbities/lymphedema Ulcer/skin breakdown will have a volume reduction of 80% by week 12 Date Initiated: 03/21/2019 Target Resolution Date: 04/21/2019 Goal Status: Active Interventions: Assess patient/caregiver ability to obtain necessary supplies Assess patient/caregiver ability to perform ulcer/skin care regimen upon admission and as needed Assess ulceration(s) every visit Notes: Electronic Signature(s) Signed: 04/04/2019 5:43:27 PM By: Carlene Coria RN Entered By: Carlene Coria on 04/04/2019 13:57:50 -------------------------------------------------------------------------------- Pain Assessment Details Patient Name: Date of Service: [name], [name] 04/04/2019 3:00 PM Medical Record [id number] Patient  Account Number: [id number] Date of Birth/Sex: Treating RN: 1946-03-03 (74 y.o. [name] Primary Care Marycarmen Hagey: [name] Other Clinician: Referring Jendaya Gossett: Treating Jahni Nazar/Extender:Robson, Tenna Child, [place] Siren in Treatment: 12 Active Problems Location of Pain Severity  and Description of Pain Patient Has Paino No Site Locations Pain Management and Medication Current Pain Management: Electronic Signature(s) Signed: 04/06/2019 3:10:57 PM By: Levan Hurst RN, BSN Entered By: Levan Hurst on 04/04/2019 16:02:35 -------------------------------------------------------------------------------- Patient/Caregiver Education Details Patient Name: Date of Service: RONNISHA, TATGE 12/29/2020andnbsp3:00 PM Medical Record [id number] Patient Account Number: [id number] Date of Birth/Gender: Treating RN: 08-29-1945 (74 y.o. [name] Primary Care Physician: [name] Other Clinician: Referring Physician: Treating Physician/Extender:Robson, Tenna Child, [place] Si[place] in Treatment: 12 Education Assessment Education Provided To: Patient Education Topics Provided Wound/Skin Impairment: Methods: Explain/Verbal Responses: State content correctly [employer]) Signed: 04/04/2019 5:43:27 PM By: Carlene Coria RN Entered By: Carlene Coria on 04/04/2019 13:58:09 -------------------------------------------------------------------------------- Wound Assessment Details Patient Name: Date of Service: [name], [name] 04/04/2019 3:00 PM Medical Record [id number] Patient Account Number: [id number] Date of Birth/Sex: Treating RN: [birth year] (74 y.o. [name] Primary Care Lillian Ballester: [name] Other Clinician: Referring Callaway Hailes: Treating Ladarrell Cornwall/Extender:Robson, Tenna Child, [place] Si[place] in Treatment: 12 Wound Status Wound Number: 28 Primary Lymphedema Etiology: Wound Location: Right Lower Leg  - Lateral Secondary Diabetic Wound/Ulcer of the Lower Extremity Wounding Event: Gradually Appeared Etiology: Date Acquired: 08/05/2018 Wound Open Weeks Of Treatment: 12 Status: Clustered Wound: No Comorbid Cataracts, Lymphedema, Arrhythmia, History: Congestive Heart Failure, Hypertension, Peripheral Venous Disease, Type II Diabetes, Osteoarthritis, Neuropathy, Confinement Anxiety Photos Wound Measurements Length: (cm) 0.8 % Reduc Width: (cm) 1 % Reduc Depth: (cm) 0.1 Epithel Area: (cm) 0.628 Tunnel Volume: (cm) 0.063 Underm Wound Description Classification: Full Thickness Without Exposed Support Foul Od Structures Slough/ Wound Flat and Intact Margin: Exudate Small Amount: Exudate Serous Type: Exudate amber Color: Wound Bed Granulation Amount: Large (67-100%) Granulation Quality: Pink Fascia Necrotic Amount: None Present (0%) Fat La Tendon Muscle Joint Bone E or After Cleansing: No Fibrino No Exposed Structure Exposed: No yer (Subcutaneous Tissue) Exposed: Yes Exposed: No Exposed: No Exposed: No xposed: No tion in Area: 98.2% tion in Volume: 98.2% ialization: Medium (34-66%) ing: No ining: No Treatment Notes Wound #28 (Right, Lateral Lower Leg) 1. Cleanse With Wound Cleanser Soap and water 2. Periwound Care TCA Cream 3. Primary Dressing Applied Hydrofera Blue 4. Secondary Dressing ABD Pad 6. Support Layer Applied 4 layer compression wrap Notes stocking. wound care wrapped right leg and lymphedema specialist wrapped left leg Electronic Signature(s) Signed: 04/10/2019 4:12:00 PM By: Mikeal Hawthorne EMT/HBOT Signed: 04/10/2019 5:01:35 PM By: Levan Hurst RN, BSN Previous Signature: 04/06/2019 3:10:57 PM Version By: Levan Hurst RN, BSN Entered By: Mikeal Hawthorne on 04/10/2019 10:02:43 -------------------------------------------------------------------------------- Wound Assessment Details Patient Name: Date of Service: [name], [name]  04/04/2019 3:00 PM Medical Record [id number] Patient Account Number: [id number] Date of Birth/Sex: Treating RN: 1946-04-02 (74 y.o. [name] Primary Care Thereasa Iannello: [name] Other Clinician: Referring Sharmon Cheramie: Treating Burnham Trost/Extender:Robson, Tenna Child, [place] Si[place] in Treatment: 12 Wound Status Wound Number: 31 Primary Lymphedema Etiology: Wound Location: Right Lower Leg - Posterior, Distal Secondary Diabetic Wound/Ulcer of the Lower Extremity Wounding Event: Gradually Appeared Etiology: Date Acquired: 08/05/2018 Wound Healed - Epithelialized Weeks Of Treatment: 12 Status: Clustered Wound: No Comorbid Cataracts, Lymphedema, Arrhythmia, History: Congestive Heart Failure, Hypertension, Peripheral Venous Disease, Type II Diabetes, Osteoarthritis, Neuropathy, Confinement Anxiety Photos Wound Measurements Length: (cm) 0 % Reduc Width: (cm) 0 % Reduc Depth: (cm) 0 Epithel Area: (cm) 0 Tunnel Volume: (cm) 0 Underm Wound Description Classification: Full Thickness Without Exposed Support Foul Od Structures Slough/ Wound Flat and Intact Margin: Exudate None Present Amount: Wound Bed Granulation Amount: None Present (0%) Necrotic Amount: None Present (0%)  Fascia Fat Lay Tendon Muscle Joint E Bone Ex or After Cleansing: No Fibrino No Exposed Structure Exposed: No er (Subcutaneous Tissue) Exposed: No Exposed: No Exposed: No xposed: No posed: No tion in Area: 100% tion in Volume: 100% ialization: Large (67-100%) ing: No ining: No Electronic Signature(s) Signed: 04/10/2019 4:12:00 PM By: Mikeal Hawthorne EMT/HBOT Signed: 04/10/2019 5:01:35 PM By: Levan Hurst RN, BSN Previous Signature: 04/06/2019 3:10:57 PM Version By: Levan Hurst RN, BSN Entered By: Mikeal Hawthorne on 04/10/2019 09:59:09 -------------------------------------------------------------------------------- Wound Assessment Details Patient Name: Date of  Service: [name], [name] 04/04/2019 3:00 PM Medical Record [id number] Patient Account Number: [id number] Date of Birth/Sex: Treating RN: 1945-09-11 (74 y.o. [name] Primary Care Olden Klauer: [name] Other Clinician: Referring Kendalyn Cranfield: Treating Jazmin Ley/Extender:Robson, Tenna Child, [place] Si[place] in Treatment: 12 Wound Status Wound Number: 37 Primary Lymphedema Etiology: Wound Location: Right Lower Leg - Anterior Wound Healed - Epithelialized Wounding Event: Gradually Appeared Status: Date Acquired: 03/28/2019 Comorbid Cataracts, Lymphedema, Arrhythmia, Weeks Of Treatment: 1 History: Congestive Heart Failure, Hypertension, Clustered Wound: No Peripheral Venous Disease, Type II Diabetes, Osteoarthritis, Neuropathy, Confinement Anxiety Photos Wound Measurements Length: (cm) 0 % Reduc Width: (cm) 0 % Reduc Depth: (cm) 0 Epithel Area: (cm) 0 Tunnel Volume: (cm) 0 Underm Wound Description Classification: Full Thickness Without Exposed Support Foul Od Structures Slough/ Wound Flat and Intact Margin: Exudate None Present Amount: Wound Bed Granulation Amount: None Present (0%) Necrotic Amount: None Present (0%) Fascia Fat Lay Tendon Muscle Joint E Bone Ex or After Cleansing: No Fibrino No Exposed Structure Exposed: No er (Subcutaneous Tissue) Exposed: No Exposed: No Exposed: No xposed: No posed: No tion in Area: 100% tion in Volume: 100% ialization: Large (67-100%) ing: No ining: No Electronic Signature(s) Signed: 04/10/2019 4:12:00 PM By: Mikeal Hawthorne EMT/HBOT Signed: 04/10/2019 5:01:35 PM By: Levan Hurst RN, BSN Previous Signature: 04/06/2019 3:10:57 PM Version By: Levan Hurst RN, BSN Entered By: Mikeal Hawthorne on 04/10/2019 09:57:21 -------------------------------------------------------------------------------- Vitals Details Patient Name: Date of Service: [name], [name] 04/04/2019 3:00 PM Medical Record  [id number] Patient Account Number: [id number] Date of Birth/Sex: Treating RN: 1946/03/22 (74 y.o. [name] Primary Care Ghazi Rumpf: Other Clinician: [name] Referring Shatika Grinnell: Treating Claron Rosencrans/Extender:Robson, Tenna Child, [place] Siren in Treatment: 12 Vital Signs Time Taken: 16:00 Temperature (F): 97.9 Height (in): 71 Pulse (bpm): 58 Weight (lbs): 240 Respiratory Rate (breaths/min): 20 Body Mass Index (BMI): 33.5 Blood Pressure (mmHg): 155/65 Reference Range: 80 - 120 mg / dl Electronic Signature(s) Signed: 04/06/2019 3:10:57 PM By: Levan Hurst RN, BSN Entered By: Levan Hurst on 04/04/2019 16:02:30

## 2019-04-11 ENCOUNTER — Ambulatory Visit: Payer: PPO | Attending: Internal Medicine

## 2019-04-11 ENCOUNTER — Other Ambulatory Visit: Payer: Self-pay

## 2019-04-11 ENCOUNTER — Encounter (HOSPITAL_BASED_OUTPATIENT_CLINIC_OR_DEPARTMENT_OTHER): Payer: PPO | Admitting: Internal Medicine

## 2019-04-11 DIAGNOSIS — E11622 Type 2 diabetes mellitus with other skin ulcer: Secondary | ICD-10-CM | POA: Diagnosis not present

## 2019-04-11 DIAGNOSIS — L97211 Non-pressure chronic ulcer of right calf limited to breakdown of skin: Secondary | ICD-10-CM | POA: Insufficient documentation

## 2019-04-11 DIAGNOSIS — M170 Bilateral primary osteoarthritis of knee: Secondary | ICD-10-CM | POA: Diagnosis not present

## 2019-04-11 DIAGNOSIS — R2689 Other abnormalities of gait and mobility: Secondary | ICD-10-CM | POA: Insufficient documentation

## 2019-04-11 DIAGNOSIS — S81802A Unspecified open wound, left lower leg, initial encounter: Secondary | ICD-10-CM | POA: Diagnosis not present

## 2019-04-11 DIAGNOSIS — I890 Lymphedema, not elsewhere classified: Secondary | ICD-10-CM | POA: Insufficient documentation

## 2019-04-11 DIAGNOSIS — I872 Venous insufficiency (chronic) (peripheral): Secondary | ICD-10-CM | POA: Diagnosis not present

## 2019-04-11 DIAGNOSIS — L97221 Non-pressure chronic ulcer of left calf limited to breakdown of skin: Secondary | ICD-10-CM | POA: Diagnosis not present

## 2019-04-11 DIAGNOSIS — Z9119 Patient's noncompliance with other medical treatment and regimen: Secondary | ICD-10-CM | POA: Insufficient documentation

## 2019-04-11 DIAGNOSIS — S81801A Unspecified open wound, right lower leg, initial encounter: Secondary | ICD-10-CM | POA: Diagnosis not present

## 2019-04-11 DIAGNOSIS — I87323 Chronic venous hypertension (idiopathic) with inflammation of bilateral lower extremity: Secondary | ICD-10-CM | POA: Insufficient documentation

## 2019-04-11 NOTE — Therapy (Signed)
[place], [place], [address] Phone: [phone]   Fax:  [phone]  Physical Therapy Treatment  Patient Details  Name: [name] [name] MRN: [id number] Date of Birth: [birth year] Referring Provider (PT): Darci Current   Encounter Date: 04/11/2019  PT End of Session - 04/11/19 [id number]    Visit Number  11    Number of Visits  25    Date for PT Re-Evaluation  05/09/19    PT Start Time  1430    PT Stop Time  1500    PT Time Calculation (min)  30 min    Activity Tolerance  Patient tolerated treatment well    Behavior During Therapy  [facility] for tasks assessed/performed       Past Medical History:  Diagnosis Date  . Acute kidney injury ([place])   . Aortic stenosis, mild 11/17/2013  . Arthritis    "both knees, spine, back, fingers" (11/29/2013)  . CHF (congestive heart failure) ([place])   . Edema   . GERD (gastroesophageal reflux disease)   . Heart murmur   . History of diastolic dysfunction    Echo [id number] with diastolic dysfunction  . HTN (hypertension)   . Morbid obesity ([place])   . OA (osteoarthritis)   . PAC (premature atrial contraction)    per prior Holter  . Palpitations   . Pneumonia    "as a child"  . PVC's (premature ventricular contractions)    per prior Holter  . SVT (supraventricular tachycardia) ([place])   . Tachycardia    noted at 07/28/11 visit. Started on beta blocker. Possible atrial flutter vs long PR tachycardia/AVNRT  . Type II diabetes mellitus ([place])     Past Surgical History:  Procedure Laterality Date  . CESAREAN SECTION  1985  . CESAREAN SECTION  1985  . SUPRAVENTRICULAR TACHYCARDIA ABLATION  11/29/2013  . SUPRAVENTRICULAR TACHYCARDIA ABLATION N/A 11/29/2013   Procedure: SUPRAVENTRICULAR TACHYCARDIA ABLATION;  Surgeon: Evans Lance, MD;  Location: [facility] CATH LAB;  Service: Cardiovascular;  Laterality: N/A;  . US ECHOCARDIOGRAPHY  07/25/2008   EF 55-60%  . VAGINAL DELIVERY       There were no vitals filed for this visit.  Subjective Assessment - 04/11/19 1508    Subjective  Pt states that she removed her wrap escept the 2 lower wraps. She was not wearing any compression over her kne or thigh.    Pertinent History  osteo arthritis, hx cellulitis, phlebitis and history of edema in BLE, Hx Acute kidney injury, CHF and DM II    Patient Stated Goals  I want to get this swelling down and heal my wound.    Currently in Pain?  Yes    Pain Score  10-Worst pain ever    Pain Location  Knee    Pain Orientation  Right;Left    Pain Descriptors / Indicators  Aching    Pain Type  Chronic pain    Pain Onset  More than a month ago    Pain Frequency  Intermittent    Aggravating Factors   movement    Pain Relieving Factors  rest                       [facility] Adult PT Treatment/Exercise - 04/11/19 0001      Manual Therapy   Manual Therapy  Edema management;Compression Bandaging    Manual therapy comments  wound clinic cared for wounds today. Pt has small open area on the  anterior lower leg and and the lateral/posterior proximal lower leg.     Edema Management  Tubigrip was placed over knee and thigh.     Compression Bandaging  pt was wrapped with 3 artiflex for shaping. 1 8 cm for roman sandal, 1 10 cm for ankle sole heel and 1 10 cm for spiral wrap. 12 cm from foot to below the knee. Ironing throughout to prevent wrinkles and ABD pad over the anterior lower leg due to skin integrity issues.              PT Education - 04/11/19 1511    Education Details  Pt will continue to wear her tubigrip over her L knee and thihg. She will remove her wrap after 2-3 days if she needs to cancel her PT appointment and re-iterated this education due to pt is not removing her wrap. She will remove her wrap if she experiences any pain, tingling, numbness or throbbing that is not relieved with movement or removing the top wrap.    Methods  Explanation    Comprehension   Verbalized understanding       PT Short Term Goals - 04/04/19 1515      PT SHORT TERM GOAL #1   Title  Pt will be independent with HEP    Baseline  Pt is working on her HEP occasionally at home.    Time  5    Period  Weeks    Status  On-going        PT Long Term Goals - 04/04/19 1515      PT LONG TERM GOAL #1   Title  Patient will have reduction of L lower leg limb girth by 3 cm    Baseline  Pt will be measuremed next session she has significantly reduced from her initial evaluation.    Time  5    Status  On-going    Target Date  05/23/19      PT LONG TERM GOAL #2   Title  Patient to be properly fitted with compression garment to wear on daily basis.    Baseline  Pt has a juxtalite that is too small and old that ht evelcro is no longer usable on. Trying to get her a [employer].    Time  5    Period  Weeks    Status  On-going    Target Date  05/23/19      PT LONG TERM GOAL #3   Title  Patient will be independent in self-care management principles including self-massage/bandaging and long term management plan for edema.    Baseline  Pt has difficulty with managing her lymphedema. Trying to ger her a pump to help with swelling.    Time  5    Period  Weeks    Status  On-going    Target Date  05/23/19            Plan - 04/11/19 1513    Clinical Impression Statement  Pt presents today with small open areas over the anterior distal lower leg and one surfaces open area over the posterior/lateral proximal lower leg; wound clinic cared for these open areas this session. Pt states that she removed her wrap but was wearing her velcro wrap in this area with no base stockinette and left her lower wraps on. Discussed prior education on the importance of removing wraps completely if she has not seen physical therapy for 2-3 days and the importance of a base  layer as well as checking skin. Pt was wrapped with ABD pad atthe anterior lower leg this session due help cushion skin due to  open areas. Pt was measured for Basic [place] wrap this session and was ordered from [facility]. Pt was up in girth measurements this session due to she has not been seen by physical therapy for 1 week due to the holidays and was not wearing appropriate compression. Pt will benefit from continued POC at this time.    Personal Factors and Comorbidities  Age;Comorbidity 3+    Comorbidities  CHF, acute kidney injury, DM II    Rehab Potential  Good    PT Frequency  2x / week    PT Duration  12 weeks    PT Treatment/Interventions  Electrical Stimulation;Cryotherapy;Gait training;Stair training;Functional mobility training;Therapeutic activities;Therapeutic exercise;Balance training;Neuromuscular re-education;Patient/family education;Manual lymph drainage;Compression bandaging;Passive range of motion;Taping;Vasopneumatic Device    PT Next Visit Plan  , Assess wrap, give exercises, assess self MLD and possible work out pants?    PT Home Exercise Plan  ask about pump, provide exercises    Recommended Other [place] ordered today. Medium Long in Basic Farrow wrap    Consulted and Agree with Plan of Care  Patient       Patient will benefit from skilled therapeutic intervention in order to improve the following deficits and impairments:  Difficulty walking, Increased edema  Visit Diagnosis: Lymphedema  Other abnormalities of gait and mobility     Problem List Patient Active Problem List   Diagnosis Date Noted  . Postnasal drip 11/16/2017  . Sensorineural hearing loss (SNHL), bilateral 11/16/2017  . Tinnitus of both ears 11/16/2017  . Obesity, Class II, BMI 35-39.9, isolated (see actual BMI) 11/08/2017  . CKD (chronic kidney disease), stage III 11/08/2017  . Anemia 11/07/2017  . Thrombocytosis ([place]) 11/07/2017  . Lymphedema 11/07/2017  . Routine general medical examination at a health care facility 05/10/2014  . Paroxysmal SVT (supraventricular tachycardia) ([place]) 11/28/2013  .  Physical deconditioning 11/23/2013  . Generalized weakness 11/17/2013  . Nausea 11/17/2013  . Multilevel spine pain 11/17/2013  . Aortic stenosis, mild 11/17/2013  . DM type 2 (diabetes mellitus, type 2) ([place]) 02/02/2013  . Arthritis 10/19/2012  . Mixed incontinence 10/19/2012  . GERD (gastroesophageal reflux disease) 04/20/2011  . HTN (hypertension) 01/20/2011  . Morbid obesity [facility])     Ander Purpura, PT 04/11/2019, 3:23 PM  [place], [place], [address] Phone: [phone]   Fax:  [phone]  Name: Jaylie Disharoon MRN: [id number] Date of Birth: Apr 08, 1945

## 2019-04-13 ENCOUNTER — Ambulatory Visit: Payer: PPO

## 2019-04-13 ENCOUNTER — Other Ambulatory Visit: Payer: Self-pay

## 2019-04-13 DIAGNOSIS — I890 Lymphedema, not elsewhere classified: Secondary | ICD-10-CM

## 2019-04-13 DIAGNOSIS — R2689 Other abnormalities of gait and mobility: Secondary | ICD-10-CM

## 2019-04-13 NOTE — Therapy (Signed)
[place], [place], [address] Phone: [phone]   Fax:  [phone]  Physical Therapy Treatment  Patient Details  Name: [name] [name] MRN: [id number] Date of Birth: 12-19-1945 Referring Provider (PT): Darci Current   Encounter Date: 04/13/2019  PT End of Session - 04/13/19 1308    Visit Number  12    Number of Visits  25    Date for PT Re-Evaluation  05/09/19    PT Start Time  1306    PT Stop Time  1350    PT Time Calculation (min)  44 min    Activity Tolerance  Patient tolerated treatment well    Behavior During Therapy  [facility] for tasks assessed/performed       Past Medical History:  Diagnosis Date  . Acute kidney injury ([place])   . Aortic stenosis, mild 11/17/2013  . Arthritis    "both knees, spine, back, fingers" (11/29/2013)  . CHF (congestive heart failure) ([place])   . Edema   . GERD (gastroesophageal reflux disease)   . Heart murmur   . History of diastolic dysfunction    Echo [id number] with diastolic dysfunction  . HTN (hypertension)   . Morbid obesity ([place])   . OA (osteoarthritis)   . PAC (premature atrial contraction)    per prior Holter  . Palpitations   . Pneumonia    "as a child"  . PVC's (premature ventricular contractions)    per prior Holter  . SVT (supraventricular tachycardia) ([place])   . Tachycardia    noted at 07/28/11 visit. Started on beta blocker. Possible atrial flutter vs long PR tachycardia/AVNRT  . Type II diabetes mellitus ([place])     Past Surgical History:  Procedure Laterality Date  . CESAREAN SECTION  1985  . CESAREAN SECTION  1985  . SUPRAVENTRICULAR TACHYCARDIA ABLATION  11/29/2013  . SUPRAVENTRICULAR TACHYCARDIA ABLATION N/A 11/29/2013   Procedure: SUPRAVENTRICULAR TACHYCARDIA ABLATION;  Surgeon: Evans Lance, MD;  Location: [facility] CATH LAB;  Service: Cardiovascular;  Laterality: N/A;  . US ECHOCARDIOGRAPHY  07/25/2008   EF 55-60%  . VAGINAL DELIVERY       There were no vitals filed for this visit.  Subjective Assessment - 04/13/19 1308    Subjective  Pt reports that she is doing pretty good. Her knees are sore today.    Pertinent History  osteo arthritis, hx cellulitis, phlebitis and history of edema in BLE, Hx Acute kidney injury, CHF and DM II    Patient Stated Goals  I want to get this swelling down and heal my wound.    Currently in Pain?  Yes    Pain Score  8     Pain Location  Knee    Pain Orientation  Right;Left    Pain Descriptors / Indicators  Aching    Pain Type  Chronic pain    Pain Onset  More than a month ago    Pain Frequency  Intermittent    Aggravating Factors   movement    Pain Relieving Factors  rest            LYMPHEDEMA/ONCOLOGY QUESTIONNAIRE - 04/13/19 1348      Left Lower Extremity Lymphedema   20 cm Proximal to Suprapatella  66.5 cm    10 cm Proximal to Suprapatella  66.5 cm    At Midpatella/Popliteal Crease  62.7 cm    30 cm Proximal to Floor at Lateral Plantar Foot  52 cm    20 cm  Proximal to Floor at Lateral Plantar Foot  38.4 cm    10 cm Proximal to Floor at Lateral Malleoli  26.8 cm    5 cm Proximal to 1st MTP Joint  23.3 cm    Around Proximal Great Toe  10.7 cm                [facility] Adult PT Treatment/Exercise - 04/13/19 0001      Manual Therapy   Manual Therapy  Edema management;Compression Bandaging    Manual therapy comments  Small open areas stuck to kerlex when removed, used water to loosen without tearing skin. ABD pads were placed over delicate skin and small open areas over the anterior lower leg. and over the posterior/lateral proximal lower leg. Then covered with kerlex to keep in place. Silver alginate was not removed from proximal wound that had been placed previously by wound clinic.     Edema Management  Pt will place tubigrip over her knee/thigh. Measurements were performed today of the LE.     Compression Bandaging  pt was wrapped artiflex as base layer with 3  artiflex for shaping. 1 8 cm for roman sandal, 1 10 cm for ankle sole heel and 1 10 cm for spiral wrap. 12 cm from foot to below the knee. Ironing throughout to prevent wrinkles             PT Education - 04/13/19 1357    Education Details  Pt will continue to wear her tubigrip over her L knee and thihg. Discussed compression garment that was ordered and setting up an in-home appointment with [facility] in order to use her pump which she has just received. Pt will remove her wrap if she experiences any pain, tingling, numbness or throbbing. she will also remove her wrap by the 3rd day if she is unable to see physical therapy in order to maintain skin integrity.    Person(s) Educated  Patient    Methods  Explanation    Comprehension  Verbalized understanding       PT Short Term Goals - 04/04/19 1515      PT SHORT TERM GOAL #1   Title  Pt will be independent with HEP    Baseline  Pt is working on her HEP occasionally at home.    Time  5    Period  Weeks    Status  On-going        PT Long Term Goals - 04/04/19 1515      PT LONG TERM GOAL #1   Title  Patient will have reduction of L lower leg limb girth by 3 cm    Baseline  Pt will be measuremed next session she has significantly reduced from her initial evaluation.    Time  5    Status  On-going    Target Date  05/23/19      PT LONG TERM GOAL #2   Title  Patient to be properly fitted with compression garment to wear on daily basis.    Baseline  Pt has a juxtalite that is too small and old that ht evelcro is no longer usable on. Trying to get her a [employer].    Time  5    Period  Weeks    Status  On-going    Target Date  05/23/19      PT LONG TERM GOAL #3   Title  Patient will be independent in self-care management principles including self-massage/bandaging and long term  management plan for edema.    Baseline  Pt has difficulty with managing her lymphedema. Trying to ger her a pump to help with swelling.    Time   5    Period  Weeks    Status  On-going    Target Date  05/23/19            Plan - 04/13/19 1307    Clinical Impression Statement  Pt presents today with improvement in her small open areas over the anterior distal lower leg and surface open area over the posterior/lateral proximal lower leg; silver alginate placed by wound clinic was left over proximal area and lower leg was covered in ABD pads and then kerlex to prevent sticking. Re-iterated education to remove wraps by the 3rd day if she does not see physical therapy in order to decrease risk for skin integrity issues and to continue to wear the tubigrip over the thigh and knee. Pt has plateaued with her measurements with slight increased since last time due to time off physical therapy related to clinic being closed for the holidays. Pt was wrapped over abd pads and kerlix with short stretch bandages from foot to below the knee with artiflex as a base layer. Pt will benefit from coninued POC at this time.    Personal Factors and Comorbidities  Age;Comorbidity 3+    Comorbidities  CHF, acute kidney injury, DM II    Stability/Clinical Decision Making  Stable/Uncomplicated    PT Frequency  2x / week    PT Duration  12 weeks    PT Treatment/Interventions  Electrical Stimulation;Cryotherapy;Gait training;Stair training;Functional mobility training;Therapeutic activities;Therapeutic exercise;Balance training;Neuromuscular re-education;Patient/family education;Manual lymph drainage;Compression bandaging;Passive range of motion;Taping;Vasopneumatic Device    PT Next Visit Plan  ask if she has used pump, Assess wrap, give exercises, assess self MLD and possible work out pants?    PT Home Exercise Plan  ask about pump, provide exercises    Consulted and Agree with Plan of Care  Patient       Patient will benefit from skilled therapeutic intervention in order to improve the following deficits and impairments:  Difficulty walking, Increased  edema  Visit Diagnosis: Lymphedema  Other abnormalities of gait and mobility     Problem List Patient Active Problem List   Diagnosis Date Noted  . Postnasal drip 11/16/2017  . Sensorineural hearing loss (SNHL), bilateral 11/16/2017  . Tinnitus of both ears 11/16/2017  . Obesity, Class II, BMI 35-39.9, isolated (see actual BMI) 11/08/2017  . CKD (chronic kidney disease), stage III 11/08/2017  . Anemia 11/07/2017  . Thrombocytosis ([place]) 11/07/2017  . Lymphedema 11/07/2017  . Routine general medical examination at a health care facility 05/10/2014  . Paroxysmal SVT (supraventricular tachycardia) ([place]) 11/28/2013  . Physical deconditioning 11/23/2013  . Generalized weakness 11/17/2013  . Nausea 11/17/2013  . Multilevel spine pain 11/17/2013  . Aortic stenosis, mild 11/17/2013  . DM type 2 (diabetes mellitus, type 2) ([place]) 02/02/2013  . Arthritis 10/19/2012  . Mixed incontinence 10/19/2012  . GERD (gastroesophageal reflux disease) 04/20/2011  . HTN (hypertension) 01/20/2011  . Morbid obesity ([place])     Ander Purpura, PT 04/13/2019, 2:02 PM  [place], [place], [address] Phone: [phone]   Fax:  [phone]  Name: [name] [name] MRN: [id number] Date of Birth: 1946-04-01

## 2019-04-14 NOTE — Progress Notes (Addendum)
ALZINA, DONARSKI ([id number]) Visit Report for 04/11/2019 HPI Details Patient Name: Date of Service: [name] [name], [name] [name] 04/11/2019 1:15 PM Medical Record [id number] Patient Account Number: [id number] Date of Birth/Sex: Treating RN: 07-03-1945 (74 y.o. F) Primary Care Provider: [name] Other Clinician: Referring Provider: Treating Provider/Extender:Hagen Bohorquez, Tenna Child, [place] Siren in Treatment: 13 History of Present Illness HPI Description: 04/09/15; the patient is here for review of wounds on her bilateral lower extremities for the last month. The patient has a history of lymphedema and bilateral chronic venous insufficiency with inflammation she was here for review of wounds on her bilateral legs in 2014 she was discharged home with external compression pumps which she is currently not using. Over the last month she developed wounds on her bilateral lower legs with drainage and pain and she is here for our review of this. 04/16/15; the patient's edema is under much better control. She has 3 areas one on the right anterior medial leg one on the left posterior leg and a small open area with purulent drainage on the left anterior leg. I have cultured the area on the left anterior leg 04/23/15; continued improvement in the patient's edema. All her wounds of closed I see no open areas. Culture from the left anterior leg last time was negative. READMISSION 02/07/16; this patient is a woman that I saw her earlier in 2017. I believe we discharged her very quickly with bilateral wounds in her legs secondary to chronic venous hypertension with inflammation and secondary lymphedema. She had juxtalite stockings. She has external compression pumps at home from a stay in our clinic in 2014. She tells me that she's had wounds on her bilateral lower legs since March or April of this year. 3 wounds on the left 2 on the right. She is a type II diabetic and has a history of noncompressible  vessels bilaterally although she is tolerated 4-layer compression. The last note from her primary physician Dr. [name] was in May. At that point Dr. [name] had referred the patient here for lymphedema management. The patient stated she made a phone call to year but was not able to arrange an appointment. It is not clear that she is actually using anything on the wounds currently except some gauze that was saturated with drainage per our intake nurse. She is certainly not using her juxtalite stockings and by the patient's own admission she is not using her compression pumps because they are "irritating". I don't see a recent hemoglobin A1c. The patient states she has not been systemically unwell but the legs are painful 02/14/16; patient tolerated her Profore wraps reasonably well. She is using the compression pumps 1 time per day. Measurements of her legs are better in terms of the amount of lymphedema 02/21/16; patient tolerated her Profore wraps it. She says she is using her compression pumps one or 2 times a day. Measurements of her leg circumference are improved and most of her wound dimensions are better, unfortunately the home health that we referred her to did not except her insurance but works successful in getting her accompanied that would except her insurance however she apparently has a $25 co-pay which she cannot afford as she is on a fixed income 03/05/16; patient states she did not back using the pumps. She is healed that 2 wounds on the left leg. She still has areas on the right lateral lower leg, right medial lower leg and left lateral lower leg. All of these look some better and have reduced in  area 03/13/16; still 3 wounds 1 on the left lateral leg, one on the left medial/posterior leg which is really the most extensive and a small area on the right lateral leg. All of this in the setting of severe chronic lymphedema and chronic venous inflammation. Damage to the  skin with cobblestone thickened skin. 03/20/16; still 2 areas. The area on the left lateral leg appears to of closed over. The right medial leg is still most extensive wound although it appears to be closing over right lateral only a small open area remains. Using silver alginate Profore wraps 03/27/16; the area on the left leg remains closed. The edema control is better on the left than the right. The right medial leg is still weeping but everything appears to be epithelialized. The lateral aspect of the right leg appears closed. She tells me she has old juxtalite stockings. She has been compliant with her compression pumps. 04/03/16; the area on the left leg remains closed. She has ordered new juxtalite stockings. The right posterior medial wound is fully epithelialized. However she has an irritated area over the right Achilles that she states is been itching all week under the wraps.. She has been compliant with her compression pumps 04/10/16; the area on the left leg remains closed. The right posterior medial wound is also closed today. The irritated area over the right Achilles which was probably wrap injury is also fully epithelialized and closed. The patient has not received her new juxtalite stockings, she arrives in clinic today with an old juxtalite stocking in place. Sheis using her compression pumps secondary to her severe stage III lymphedema READMISSION 01/21/17; this is a patient we know from several previous stays in this clinic. She has lymphedema with chronic venous insufficiency and inflammation. The last time she was here we discharged her with bilateral juxta lites which she is compliant with. She also has external compression pumps at home from stays in our clinic in 2014 although she tells me that she is not very compliant with these as when she uses them a causes pain in her bilateral knees the patient states that her wounds on her left greater than right leg including 2  large areas on the left anterior and left lateral leg and an area on the right leg open in late June or July since then she is been followed by [facility] and she's had bilateral Unna boot supplied every week for the last month.. I think the wounds have actually improved although they are not specifically dressed. She states she had an ultrasound to rule out DVT bilaterally that was negative. She does not have arterial studies although she is a diabetic. She states she has a lot of pain in her legs she states she does not use her external compression pumps because it causes pain in her knees. As usual her ABIs were noncompressible bilaterally in this clinic 01/28/17; currently the patient only has one small wound on the left lateral leg and I think this should be healed next week. We are going to try to get her bilateral extremitease stockings. She tells me that compression pumps heard her posterior rib arthritic knees so she is not been compliant with this 02/04/17; the patient's wounds are totally healed. She has chronic venous insufficiency and secondary lymphedema. She has new extremitease stockings. She also has compression pumps Readmission: 04/21/17 on evaluation today patient appears to be doing about the same as what she was previous when we were evaluating her back  in November 2018. At that point she had completely healed. With that being said she has now reopened and states that she wasn't aware that she was supposed to come back. 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 Subsequently she also states that although she has the compression that we got for her previously the extremitease stockings she nonetheless doesn't always wear these and being noncompliant often has things will reopen as far as ulcers on her lower extremities. No fevers, chills, nausea, or vomiting noted at  this time. Patient has no evidence of dementia. She is having some discomfort in regard to the ulcers at this point. 04/28/17 on evaluation today patient appears to be doing significantly better in regard to her wounds. With that being said she is still having significant discomfort and is not really keen on sharp debridement. She does not appear to have any evidence of infection which is good news I do think the Iodoflex is beneficial for her. Of note her ABI's were found in the system to be on the right 1.21 with a TBI of 0.76 and on the left 1.35 with a TBI of 0.75. In general her swelling appears to be significantly improved however. No fevers, chills, nausea, or vomiting noted at this time. Patient has been tolerating the wraps without complication. She has no evidence of dementia. 05/05/17-she is here in follow-up evaluation for bilateral lower extremity venous and lymphedema ulcers. She states she has not been using her lymphedema pumps secondary to pain at her knees. After discussion it was noted that she uses knee-high lymphedema pumps but does have a pair of thigh-high lymphedema pumps. She was strongly advised to use the thigh-high lymphedema pumps and if necessary reduce the setting for improved tolerance. She reports no change in odor and/or drainage. She continues to tolerate sharp debridement poorly with significant pain, primarily to the right lower extremity. We will continue with current treatment plan, along with improved lymphedema pump use and follow-up next week 05/12/17 on evaluation today patient actually appears to be doing much better in regard to her bilateral lower extremity ulcers. Only the right altar actually requires debridement today. The other two cleaned off nicely in two of the three in fact on the left lower extremity or actually almost completely healed. She does have some weeping noted still the bilateral lower extremities left greater than right. She still is not  able to use her compression pumps even though I have mentioned this several times to her as did Leah last week. No fevers, chills, nausea, or vomiting noted at this time. Patient has been tolerating the dressing changes without complication. Patient has no evidence of dementia 05/19/17 on evaluation today patient appears to be doing very well regard to her bilateral lower extremity ulcers. In fact she seems to be improving and in fact healed in a couple of locations that were giving her trouble last week. Overall I'm extremely pleased with how things have progressed. She still has some discomfort especially in the right lateral lower extremity. Fortunately there does not appear to be any evidence of significant infection which is good news she does tell me that she has used her compression pumps several times although not routinely over the past week I did praise her for this. I do think it will be beneficial and that might even be what's helping with her wounds as far as healing  is concerned at least to a degree. No fevers, chills, nausea, or vomiting noted at this time. Patient has no dementia and no evidence of infection. 05/26/17 on evaluation today patient presents for evaluation concerning her bilateral lower extremity ulcer secondary to lymphedema. Fortunately she appears to be doing very well at this point in time. Specifically her right leg appears to be completely closed and healed. The left leg though there is still a small area of opening overall has healed. She does have a little bit of excoriation at the top or the wraps are because they slid down on her. Otherwise there's no evidence of infection and the other has improved dramatically now that she is not draining as significantly. Patient is having no significant pain she tells me she has been able to use her lymphedema pumps one time a day although that's not all she can tolerate. Even that is difficult for her. 06/02/17 on evaluation  today patient's ulcers appear to be completely closed at this point. She does still note some odor but I believe this is due to the fact she has not been able to wash her legs as well currently. Fortunately she did bring her compression with her today. She does have the EXTREMIT-EASE Compression. With that being said she is somewhat nervous about going back to this as previously she broke out yet again once we discontinue wrapping her. READMISSION 11/23/17; patient is now 74 year old diabetic woman with severe chronic venous insufficiency with lymphedema. We care for her last in this clinic in February 2019 at which time she had chronic venous insufficiency with lymphedema bilateral lower extremity ulcers. We discharged her with bilateral wrap around/extremitease stockings and compression pumps. Is fairly clear she is not using the compression pumps stating it causes pain in her knees. In any case she was admitted to hospital from 8/3 through 8/6 with a several week history of recurrent bilateral ulcers. She was felt to have bilateral cellulitis treated with doxycycline IV. She is still on oral doxycycline and has another 2 days worth. At discharge her white count was 14.1 hemoglobin at 8.9. The patient is a diabetic however her last ABIs in 2018 showed a right ABI of 1.21, left of 1.35. TBI's were 0.76 on the right 0.75 on the left. Posterior tibial artery was monophasic on the right triphasic on the left. Dorsalis pedis triphasic on the right and triphasic on the left. She is not felt to have significant macrovascular disease 12/02/17;significant bilateral lower extremity wounds secondary to severe uncontrolled lymphedema. She did not get home health out to change the wraps. So she kept this on all week. She is telling me she using his her compression pumps once a week. In general all the wounds look better. We've been using silver alginate with secondary absorptive dressings 4 layer  compression 12/10/17; bilateral lower extremity wounds secondary to uncontrolled lymphedema. She tells me she is using her compression pumps on most days. We've been keeping wraps on all week. Using silver alginate. She has wounds on the right lateral calf left medial and left lateral calf. All of this is somewhat better 12/16/17; the patient has 3 small open areas. One on the right lateral calf, one on the left medial calf and the small area on the left lateral calf. She has lymphedema, chronic venous insufficiency with hemosiderin deposition. Very fibrotic distal lower extremity scan with suggestion of an inverted bottle sign appearance to her legs. She has compression pumps but she does not use  them has not used them in the last week. Currently we have her in 4 layer compression, silver alginate to the wounds and this is being changed once by home health. She is noncompliant with the compression pumps because she says it hurts her knees. She also has compression stockings ando Juxta light stockings at home 12/24/17; the patient's right lateral calf is healed as well as the left medial. There is no open wound on the right leg. She still has 2 areas on the left lateral calf. We transitioned the right leg into an extremitease stocking which she brought in from home. 12/31/17; the patient has a superficial wound on the left lateral lower calf. We've been wrapping this leg. She arrives today with the right leg swollen but without a wound. She cannot get her extremities stocking on the right leg. There is a lot more edema here. She is not using her compression pumps 01/10/2018; patient has a same superficial wound on the left lateral calf. Her right leg has less peripheral edema. She tells me she also started herself on some Lasix that had been previously prescribed for her at some point but she had never taken it. She is also concerned about whether her compression pumps are leaking. She has extremitease  stockings however the edema gets bad enough she can get these on. Currently she has no open wound on the right leg and is mention the edema is actually better than when I saw this 10 days ago 01/17/2018; patient has a same superficial area on the posterior left calf and a weeping area in the mid part of the right lateral calf. She still does not have anyone from [facility] coming to see her compression pumps which she describes as being suspicious for a leakage. I am not really sure how frequently she is using this in any case. She has massive bilateral calf lymphedema. I think there is some spreading lymphedema into her posterior thighs. 01/24/2018; the patient's edema in her legs is actually some better. She still has a smaller area on the left posterior calf although this is better. The weeping area on the right lateral calf is also better. As it turned out she did not get her pumps from [facility] but [facility] and they are going to try to go out and see these this week which is going to help. I explained to her that she will use the compression pumps once a day while we are wrapping her legs but when she transitions to stockings that may be she will require pumping twice a day. Her compliance in this area has not been high and I wonder about her ability to do this herself. 02/01/2018; the area on the posterior left leg is healed. She still has the original wound on the right lateral calf which is epithelialized. The however just above this there is still weeping lymphedema fluid. She has a new area on the upper right calf which is either wrap injury or is she points out where she has been scratching under the wraps. We still not have managed to get a hold of a representative of [facility] to see if the pump is operational. She has extremitease stockings at home however I am not of the opinion that this will maintain her skin integrity without the compression  pumps. 02/08/2018; posterior left leg remains healed although she has a new small open area on the left lateral calf. Right lateral calf has two quarter sized open  areas and close juxtaposition. There is less weeping edema fluid. She apparently managed to contact [facility]. They are sending her a part. But she is still not received it 02/15/2018; the patient has no open wound on either leg. Some scale present on the right upper lateral calf just below the fibular head. Edema control is excellent. She did not bring her stockings or her juxta lite wrap around. She has the part for her compression pump but she has not use the compression pumps yet READMISSION 01/10/2019 Patient is now a 74 year old woman that we have had in the clinic multiple times in the past. She has chronic lower extremity lymphedema, recurrent wounds on her bilateral lower extremities. She is also a type II diabetic reasonably well controlled with a recent hemoglobin A1c while she was in the hospital of 5.9. She has wounds on her bilateral lower extremities mostly on the left lateral and right lateral. These were noted also during her recent hospitalization. The patient states they have been there for a while. She has not been able to wear her stockings he has trouble getting them on as she lives alone. She also has external compression pumps but uses them sparingly because it causes pain in her knees which hurt because of osteoarthritis. The patient was on doxycycline before she went into the hospital apparently for fear of infection in her legs and is on Keflex coming out because of a UTI The patient was recently in the hospital from 01/06/2019 through 01/09/2019. She was admitted with nausea and vomiting and prerenal azotemia. This was corrected with IV fluid her Lasix has been put on hold. Hemoglobin A1c at 5.9 her metformin was discontinued. She was also felt to have a UTI she was prescribed Keflex at 503 times a  day she is picking that up today. The patient is not felt to have an arterial issue. ABIs in our clinic were 1.24 on the right and 1.06 on the left. She had formal studies in 2018 at which time her ABIs were 1.21 on the right TBI of 0.76 and on the left 1.35 and 0.75 respectively. Waveforms are on the right were monophasic and biphasic, triphasic and biphasic on the left. She is tolerated 4 layer compression in the past 10/13. Comes in today having not used her compression pumps /perhaps once in the last week. She has too much edema to consider healing these wounds. We have been using silver alginate 10/20; she tells me he had she used her compression pumps once a day as we asked. Clearly she has edema out of her legs although most of it seems to be settling around her knees which is what she complains about. We have been using silver alginate to the extensive de-epithelialized area on the left lateral calf and a much smaller area on the right lateral extending linearly into the right posterior 10/27; she is using her compression pumps daily. Her edema control is better. We have been using silver alginate to the extensive wound areas on her bilateral lower legs. This includes left anterior left lateral and left posterior. Right anterior right lateral and right posterior. The wounds are not all in the same condition. She has severe bilateral skin damage with the epithelialization, flaking dried epithelium 11/3; she is using her compression pumps 1 times daily. Pretty obvious that her wraps fell down especially on the left leg to mid calf. I think things are improving on the right lateral calf, I do not see anything open  posteriorly. The major areas on the left lateral where she has 3 or 4 deeper wounds in the midst of an entire de-epithelialized area. There is erythema here but no tenderness I think this represents venous stasis rather than cellulitis. I debrided the areas on the left lateral calf  last week but once again they have tightly adherent debris over the top of them which is disappointing 11/10; the patient's major areas on the left lateral calf to 3 wounds with some depth surrounded by a large area of de-epithelialized tissue. There is no infection. We have been using silver alginate under 4-layer compression. On the right lateral she has a more contained wound area that is superficial. Been using silver alginate under 4-layer compression 11/17; the patient's major wound is on the left lateral calf. We have been using silver alginate under compression. There is no open wound per se on the right but it de-epithelialized area on the lateral calf that is weeping edema fluid. I do not see much change here today. 12/1; patient arrives in clinic with her wounds looking better. Her edema control is better. The lymphedema specialist working on the left leg we have been working on the right. We have been using 4-layer compression. The patient is using a consider external compression pumps and Hydrofera Blue to the open areas. She is being measured for an abdominal compression pump and apparently that is going to go through her insurance 12/15; continued improvement. 2 small areas on the left and slightly larger 2 small areas on the right. 12/22; patient still has open areas on the right lateral calf which are superficial and the new one on the right anterior calf. She did not use her compression pumps this week. Her left leg is wrapped by our lymphedema specialist. There are no open wounds here 12/29; she still has a superficial area on the right lateral calf and area on the right medial calf. Right anterior wound area appears to have closed. She has much better edema control today than last week 04/11/2019; the patient is here with severe bilateral lymphedema, chronic venous insufficiency with marked skin changes in her lower extremities. We are wrapping her right leg and her lymphedema  specialist is putting lymphedema wraps on the left. She states she is using her compression pumps although in the past her compliance with uses been limited because of osteoarthritic pain in her knees. She only has one remaining area on the right leg however she has rapid injuries on the left at the upper level what where I think her wraps were the left lateral abdomen area left posterior. These would be new this week Electronic Signature(s) Signed: 04/17/2019 6:04:26 PM By: Linton Ham MD Signed: 06/01/2019 2:23:51 PM By: Levan Hurst RN, BSN Previous Signature: 04/14/2019 5:14:53 AM Version By: Linton Ham MD Entered By: Levan Hurst on 04/17/2019 08:28:11 -------------------------------------------------------------------------------- Physical Exam Details Patient Name: Date of Service: [name] [name], [name] [name] 04/11/2019 1:15 PM Medical Record [id number] Patient Account Number: [id number] Date of Birth/Sex: Treating RN: Apr 25, 1945 (74 y.o. F) Primary Care Provider: [name] Other Clinician: Referring Provider: Treating Provider/Extender:Shanna Strength, Tenna Child, [place] Siren in Treatment: 31 Constitutional Patient is hypertensive.. Pulse regular and within target range for patient.Marland Kitchen Respirations regular, non-labored and within target range.. Temperature is normal and within the target range for the patient.Marland Kitchen Appears in no distress. Respiratory work of breathing is normal. Cardiovascular pedal pulses are palpable. edema control is marginal. Integumentary (Hair, Skin) no evidence of cellulitis or DVT in the. Notes wound  exam New open wounds on the left lateral and left posterior calf these were in lymphedema wraps. There is one small open area on the right lateral calf. Electronic Signature(s) Signed: 04/14/2019 5:14:53 AM By: Linton Ham MD Entered By: Linton Ham on 04/14/2019  05:12:02 -------------------------------------------------------------------------------- Physician Orders Details Patient Name: Date of Service: [name] [name], [name] [name] 04/11/2019 1:15 PM Medical Record [id number] Patient Account Number: [id number] Date of Birth/Sex: Treating RN: 1945-07-29 (74 y.o. [name] Primary Care Provider: [name] Other Clinician: Referring Provider: Treating Provider/Extender:Zariel Capano, Tenna Child, [place] Siren in Treatment: 13 Verbal / Phone Orders: No Diagnosis Coding ICD-10 Coding Code Description I89.0 Lymphedema, not elsewhere classified L97.221 Non-pressure chronic ulcer of left calf limited to breakdown of skin L97.211 Non-pressure chronic ulcer of right calf limited to breakdown of skin E11.622 Type 2 diabetes mellitus with other skin ulcer Follow-up Appointments Return Appointment in 1 week. Nurse Visit: - Thursday for Lymphedema Dressing Change Frequency Wound #38 Left,Posterior Lower Leg Do not change entire dressing for one week. Wound #39 Left,Anterior Lower Leg Do not change entire dressing for one week. Skin Barriers/Peri-Wound Care Barrier cream Moisturizing lotion TCA Cream or Ointment Wound Cleansing May shower and wash wound with soap and water. Primary Wound Dressing Wound #38 Left,Posterior Lower Leg Calcium Alginate with Silver Wound #39 Left,Anterior Lower Leg Calcium Alginate with Silver Secondary Dressing Wound #38 Left,Posterior Lower Leg Kerlix/Rolled Gauze - PT to apply lymphedema wrap over dressing ABD pad Wound #39 Left,Anterior Lower Leg Kerlix/Rolled Gauze - PT to apply lymphedema wrap over dressing ABD pad Edema Control 4 layer compression - Right Lower Extremity - unna boot at top to help secure wrap Other: - left leg: PT to apply lymphedema wraps Electronic Signature(s) Signed: 04/11/2019 5:08:58 PM By: Carlene Coria RN Signed: 04/14/2019 5:14:53 AM By: Linton Ham MD Entered By:  Carlene Coria on 04/11/2019 14:26:06 -------------------------------------------------------------------------------- Problem List Details Patient Name: Date of Service: [name] [name], [name] [name] 04/11/2019 1:15 PM Medical Record [id number] Patient Account Number: [id number] Date of Birth/Sex: Treating RN: 1945-08-22 (74 y.o. [place] Lo, [place] Primary Care Provider: [name] Other Clinician: Referring Provider: Treating Provider/Extender:Tyjai Charbonnet, Tenna Child, [place] Siren in Treatment: 13 Active Problems ICD-10 Evaluated Encounter Code Description Active Date Today Diagnosis I89.0 Lymphedema, not elsewhere classified 01/10/2019 No Yes L97.221 Non-pressure chronic ulcer of left calf limited to 01/10/2019 No Yes breakdown of skin L97.211 Non-pressure chronic ulcer of right calf limited to 01/10/2019 No Yes breakdown of skin E11.622 Type 2 diabetes mellitus with other skin ulcer 01/10/2019 No Yes Inactive Problems Resolved Problems Electronic Signature(s) Signed: 04/14/2019 5:14:53 AM By: Linton Ham MD Previous Signature: 04/11/2019 5:08:58 PM Version By: Carlene Coria RN Entered By: Linton Ham on 04/14/2019 05:02:57 -------------------------------------------------------------------------------- Progress Note Details Patient Name: Date of Service: [name] [name], [name] [name] 04/11/2019 1:15 PM Medical Record [id number] Patient Account Number: [id number] Date of Birth/Sex: Treating RN: Feb 28, 1946 (74 y.o. F) Primary Care Provider: [name] Other Clinician: Referring Provider: Treating Provider/Extender:Ninfa Giannelli, Tenna Child, [place] Siren in Treatment: 13 Subjective History of Present Illness (HPI) 04/09/15; the patient is here for review of wounds on her bilateral lower extremities for the last month. The patient has a history of lymphedema and bilateral chronic venous insufficiency with inflammation she was here for review of wounds on her bilateral legs  in 2014 she was discharged home with external compression pumps which she is currently not using. Over the last month she developed wounds on her bilateral lower legs with drainage and pain and she is here for our review of this.  04/16/15; the patient's edema is under much better control. She has 3 areas one on the right anterior medial leg one on the left posterior leg and a small open area with purulent drainage on the left anterior leg. I have cultured the area on the left anterior leg 04/23/15; continued improvement in the patient's edema. All her wounds of closed I see no open areas. Culture from the left anterior leg last time was negative. READMISSION 02/07/16; this patient is a woman that I saw her earlier in 2017. I believe we discharged her very quickly with bilateral wounds in her legs secondary to chronic venous hypertension with inflammation and secondary lymphedema. She had juxtalite stockings. She has external compression pumps at home from a stay in our clinic in 2014. She tells me that she's had wounds on her bilateral lower legs since March or April of this year. 3 wounds on the left 2 on the right. She is a type II diabetic and has a history of noncompressible vessels bilaterally although she is tolerated 4-layer compression. The last note from her primary physician Dr. [name] was in May. At that point Dr. [name] had referred the patient here for lymphedema management. The patient stated she made a phone call to year but was not able to arrange an appointment. It is not clear that she is actually using anything on the wounds currently except some gauze that was saturated with drainage per our intake nurse. She is certainly not using her juxtalite stockings and by the patient's own admission she is not using her compression pumps because they are "irritating". I don't see a recent hemoglobin A1c. The patient states she has not been systemically unwell but the legs  are painful 02/14/16; patient tolerated her Profore wraps reasonably well. She is using the compression pumps 1 time per day. Measurements of her legs are better in terms of the amount of lymphedema 02/21/16; patient tolerated her Profore wraps it. She says she is using her compression pumps one or 2 times a day. Measurements of her leg circumference are improved and most of her wound dimensions are better, unfortunately the home health that we referred her to did not except her insurance but works successful in getting her accompanied that would except her insurance however she apparently has a $25 co-pay which she cannot afford as she is on a fixed income 03/05/16; patient states she did not back using the pumps. She is healed that 2 wounds on the left leg. She still has areas on the right lateral lower leg, right medial lower leg and left lateral lower leg. All of these look some better and have reduced in area 03/13/16; still 3 wounds 1 on the left lateral leg, one on the left medial/posterior leg which is really the most extensive and a small area on the right lateral leg. All of this in the setting of severe chronic lymphedema and chronic venous inflammation. Damage to the skin with cobblestone thickened skin. 03/20/16; still 2 areas. The area on the left lateral leg appears to of closed over. The right medial leg is still most extensive wound although it appears to be closing over right lateral only a small open area remains. Using silver alginate Profore wraps 03/27/16; the area on the left leg remains closed. The edema control is better on the left than the right. The right medial leg is still weeping but everything appears to be epithelialized. The lateral aspect of the right leg appears closed. She tells  me she has old juxtalite stockings. She has been compliant with her compression pumps. 04/03/16; the area on the left leg remains closed. She has ordered new juxtalite stockings. The  right posterior medial wound is fully epithelialized. However she has an irritated area over the right Achilles that she states is been itching all week under the wraps.. She has been compliant with her compression pumps 04/10/16; the area on the left leg remains closed. The right posterior medial wound is also closed today. The irritated area over the right Achilles which was probably wrap injury is also fully epithelialized and closed. The patient has not received her new juxtalite stockings, she arrives in clinic today with an old juxtalite stocking in place. Sheis using her compression pumps secondary to her severe stage III lymphedema READMISSION 01/21/17; this is a patient we know from several previous stays in this clinic. She has lymphedema with chronic venous insufficiency and inflammation. The last time she was here we discharged her with bilateral juxta lites which she is compliant with. She also has external compression pumps at home from stays in our clinic in 2014 although she tells me that she is not very compliant with these as when she uses them a causes pain in her bilateral knees the patient states that her wounds on her left greater than right leg including 2 large areas on the left anterior and left lateral leg and an area on the right leg open in late June or July since then she is been followed by [facility] and she's had bilateral Unna boot supplied every week for the last month.. I think the wounds have actually improved although they are not specifically dressed. She states she had an ultrasound to rule out DVT bilaterally that was negative. She does not have arterial studies although she is a diabetic. She states she has a lot of pain in her legs she states she does not use her external compression pumps because it causes pain in her knees. As usual her ABIs were noncompressible bilaterally in this clinic 01/28/17; currently the patient only has one small wound on the  left lateral leg and I think this should be healed next week. We are going to try to get her bilateral extremitease stockings. She tells me that compression pumps heard her posterior rib arthritic knees so she is not been compliant with this 02/04/17; the patient's wounds are totally healed. She has chronic venous insufficiency and secondary lymphedema. She has new extremitease stockings. She also has compression pumps Readmission: 04/21/17 on evaluation today patient appears to be doing about the same as what she was previous when we were evaluating her back in November 2018. At that point she had completely healed. With that being said she has now reopened and states that she wasn't aware that she was supposed to come back. 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 Subsequently she also states that although she has the compression that we got for her previously the extremitease stockings she nonetheless doesn't always wear these and being noncompliant often has things will reopen as far as ulcers on her lower extremities. No fevers, chills, nausea, or vomiting noted at this time. Patient has no evidence of dementia. She is having some discomfort in regard to the ulcers at this point. 04/28/17 on evaluation today patient appears to be doing significantly better in regard to her wounds. With  that being said she is still having significant discomfort and is not really keen on sharp debridement. She does not appear to have any evidence of infection which is good news I do think the Iodoflex is beneficial for her. Of note her ABI's were found in the system to be on the right 1.21 with a TBI of 0.76 and on the left 1.35 with a TBI of 0.75. In general her swelling appears to be significantly improved however. No fevers, chills, nausea, or vomiting noted at this time. Patient has been tolerating  the wraps without complication. She has no evidence of dementia. 05/05/17-she is here in follow-up evaluation for bilateral lower extremity venous and lymphedema ulcers. She states she has not been using her lymphedema pumps secondary to pain at her knees. After discussion it was noted that she uses knee-high lymphedema pumps but does have a pair of thigh-high lymphedema pumps. She was strongly advised to use the thigh-high lymphedema pumps and if necessary reduce the setting for improved tolerance. She reports no change in odor and/or drainage. She continues to tolerate sharp debridement poorly with significant pain, primarily to the right lower extremity. We will continue with current treatment plan, along with improved lymphedema pump use and follow-up next week 05/12/17 on evaluation today patient actually appears to be doing much better in regard to her bilateral lower extremity ulcers. Only the right altar actually requires debridement today. The other two cleaned off nicely in two of the three in fact on the left lower extremity or actually almost completely healed. She does have some weeping noted still the bilateral lower extremities left greater than right. She still is not able to use her compression pumps even though I have mentioned this several times to her as did Leah last week. No fevers, chills, nausea, or vomiting noted at this time. Patient has been tolerating the dressing changes without complication. Patient has no evidence of dementia 05/19/17 on evaluation today patient appears to be doing very well regard to her bilateral lower extremity ulcers. In fact she seems to be improving and in fact healed in a couple of locations that were giving her trouble last week. Overall I'm extremely pleased with how things have progressed. She still has some discomfort especially in the right lateral lower extremity. Fortunately there does not appear to be any evidence of significant infection  which is good news she does tell me that she has used her compression pumps several times although not routinely over the past week I did praise her for this. I do think it will be beneficial and that might even be what's helping with her wounds as far as healing is concerned at least to a degree. No fevers, chills, nausea, or vomiting noted at this time. Patient has no dementia and no evidence of infection. 05/26/17 on evaluation today patient presents for evaluation concerning her bilateral lower extremity ulcer secondary to lymphedema. Fortunately she appears to be doing very well at this point in time. Specifically her right leg appears to be completely closed and healed. The left leg though there is still a small area of opening overall has healed. She does have a little bit of excoriation at the top or the wraps are because they slid down on her. Otherwise there's no evidence of infection and the other has improved dramatically now that she is not draining as significantly. Patient is having no significant pain she tells me she has been able to use her lymphedema pumps one time  a day although that's not all she can tolerate. Even that is difficult for her. 06/02/17 on evaluation today patient's ulcers appear to be completely closed at this point. She does still note some odor but I believe this is due to the fact she has not been able to wash her legs as well currently. Fortunately she did bring her compression with her today. She does have the EXTREMIT-EASE Compression. With that being said she is somewhat nervous about going back to this as previously she broke out yet again once we discontinue wrapping her. READMISSION 11/23/17; patient is now 74 year old diabetic woman with severe chronic venous insufficiency with lymphedema. We care for her last in this clinic in February 2019 at which time she had chronic venous insufficiency with lymphedema bilateral lower extremity ulcers. We  discharged her with bilateral wrap around/extremitease stockings and compression pumps. Is fairly clear she is not using the compression pumps stating it causes pain in her knees. In any case she was admitted to hospital from 8/3 through 8/6 with a several week history of recurrent bilateral ulcers. She was felt to have bilateral cellulitis treated with doxycycline IV. She is still on oral doxycycline and has another 2 days worth. At discharge her white count was 14.1 hemoglobin at 8.9. The patient is a diabetic however her last ABIs in 2018 showed a right ABI of 1.21, left of 1.35. TBI's were 0.76 on the right 0.75 on the left. Posterior tibial artery was monophasic on the right triphasic on the left. Dorsalis pedis triphasic on the right and triphasic on the left. She is not felt to have significant macrovascular disease 12/02/17;significant bilateral lower extremity wounds secondary to severe uncontrolled lymphedema. She did not get home health out to change the wraps. So she kept this on all week. She is telling me she using his her compression pumps once a week. In general all the wounds look better. We've been using silver alginate with secondary absorptive dressings 4 layer compression 12/10/17; bilateral lower extremity wounds secondary to uncontrolled lymphedema. She tells me she is using her compression pumps on most days. We've been keeping wraps on all week. Using silver alginate. She has wounds on the right lateral calf left medial and left lateral calf. All of this is somewhat better 12/16/17; the patient has 3 small open areas. One on the right lateral calf, one on the left medial calf and the small area on the left lateral calf. She has lymphedema, chronic venous insufficiency with hemosiderin deposition. Very fibrotic distal lower extremity scan with suggestion of an inverted bottle sign appearance to her legs. She has compression pumps but she does not use them has not used them in  the last week. Currently we have her in 4 layer compression, silver alginate to the wounds and this is being changed once by home health. She is noncompliant with the compression pumps because she says it hurts her knees. She also has compression stockings ando Juxta light stockings at home 12/24/17; the patient's right lateral calf is healed as well as the left medial. There is no open wound on the right leg. She still has 2 areas on the left lateral calf. We transitioned the right leg into an extremitease stocking which she brought in from home. 12/31/17; the patient has a superficial wound on the left lateral lower calf. We've been wrapping this leg. She arrives today with the right leg swollen but without a wound. She cannot get her extremities stocking on the right  leg. There is a lot more edema here. She is not using her compression pumps 01/10/2018; patient has a same superficial wound on the left lateral calf. Her right leg has less peripheral edema. She tells me she also started herself on some Lasix that had been previously prescribed for her at some point but she had never taken it. She is also concerned about whether her compression pumps are leaking. She has extremitease stockings however the edema gets bad enough she can get these on. Currently she has no open wound on the right leg and is mention the edema is actually better than when I saw this 10 days ago 01/17/2018; patient has a same superficial area on the posterior left calf and a weeping area in the mid part of the right lateral calf. She still does not have anyone from [facility] coming to see her compression pumps which she describes as being suspicious for a leakage. I am not really sure how frequently she is using this in any case. She has massive bilateral calf lymphedema. I think there is some spreading lymphedema into her posterior thighs. 01/24/2018; the patient's edema in her legs is actually some better. She  still has a smaller area on the left posterior calf although this is better. The weeping area on the right lateral calf is also better. As it turned out she did not get her pumps from [facility] but [facility] and they are going to try to go out and see these this week which is going to help. I explained to her that she will use the compression pumps once a day while we are wrapping her legs but when she transitions to stockings that may be she will require pumping twice a day. Her compliance in this area has not been high and I wonder about her ability to do this herself. 02/01/2018; the area on the posterior left leg is healed. She still has the original wound on the right lateral calf which is epithelialized. The however just above this there is still weeping lymphedema fluid. She has a new area on the upper right calf which is either wrap injury or is she points out where she has been scratching under the wraps. We still not have managed to get a hold of a representative of [facility] to see if the pump is operational. She has extremitease stockings at home however I am not of the opinion that this will maintain her skin integrity without the compression pumps. 02/08/2018; posterior left leg remains healed although she has a new small open area on the left lateral calf. Right lateral calf has two quarter sized open areas and close juxtaposition. There is less weeping edema fluid. She apparently managed to contact [facility]. They are sending her a part. But she is still not received it 02/15/2018; the patient has no open wound on either leg. Some scale present on the right upper lateral calf just below the fibular head. Edema control is excellent. She did not bring her stockings or her juxta lite wrap around. She has the part for her compression pump but she has not use the compression pumps yet READMISSION 01/10/2019 Patient is now a 74 year old woman that we have  had in the clinic multiple times in the past. She has chronic lower extremity lymphedema, recurrent wounds on her bilateral lower extremities. She is also a type II diabetic reasonably well controlled with a recent hemoglobin A1c while she was in the hospital of 5.9.  She has wounds on her bilateral lower extremities mostly on the left lateral and right lateral. These were noted also during her recent hospitalization. The patient states they have been there for a while. She has not been able to wear her stockings he has trouble getting them on as she lives alone. She also has external compression pumps but uses them sparingly because it causes pain in her knees which hurt because of osteoarthritis. The patient was on doxycycline before she went into the hospital apparently for fear of infection in her legs and is on Keflex coming out because of a UTI The patient was recently in the hospital from 01/06/2019 through 01/09/2019. She was admitted with nausea and vomiting and prerenal azotemia. This was corrected with IV fluid her Lasix has been put on hold. Hemoglobin A1c at 5.9 her metformin was discontinued. She was also felt to have a UTI she was prescribed Keflex at 503 times a day she is picking that up today. The patient is not felt to have an arterial issue. ABIs in our clinic were 1.24 on the right and 1.06 on the left. She had formal studies in 2018 at which time her ABIs were 1.21 on the right TBI of 0.76 and on the left 1.35 and 0.75 respectively. Waveforms are on the right were monophasic and biphasic, triphasic and biphasic on the left. She is tolerated 4 layer compression in the past 10/13. Comes in today having not used her compression pumps /perhaps once in the last week. She has too much edema to consider healing these wounds. We have been using silver alginate 10/20; she tells me he had she used her compression pumps once a day as we asked. Clearly she has edema out of her legs although  most of it seems to be settling around her knees which is what she complains about. We have been using silver alginate to the extensive de-epithelialized area on the left lateral calf and a much smaller area on the right lateral extending linearly into the right posterior 10/27; she is using her compression pumps daily. Her edema control is better. We have been using silver alginate to the extensive wound areas on her bilateral lower legs. This includes left anterior left lateral and left posterior. Right anterior right lateral and right posterior. The wounds are not all in the same condition. She has severe bilateral skin damage with the epithelialization, flaking dried epithelium 11/3; she is using her compression pumps 1 times daily. Pretty obvious that her wraps fell down especially on the left leg to mid calf. I think things are improving on the right lateral calf, I do not see anything open posteriorly. The major areas on the left lateral where she has 3 or 4 deeper wounds in the midst of an entire de-epithelialized area. There is erythema here but no tenderness I think this represents venous stasis rather than cellulitis. I debrided the areas on the left lateral calf last week but once again they have tightly adherent debris over the top of them which is disappointing 11/10; the patient's major areas on the left lateral calf to 3 wounds with some depth surrounded by a large area of de-epithelialized tissue. There is no infection. We have been using silver alginate under 4-layer compression. On the right lateral she has a more contained wound area that is superficial. Been using silver alginate under 4-layer compression 11/17; the patient's major wound is on the left lateral calf. We have been using silver alginate under  compression. There is no open wound per se on the right but it de-epithelialized area on the lateral calf that is weeping edema fluid. I do not see much change here  today. 12/1; patient arrives in clinic with her wounds looking better. Her edema control is better. The lymphedema specialist working on the left leg we have been working on the right. We have been using 4-layer compression. The patient is using a consider external compression pumps and Hydrofera Blue to the open areas. She is being measured for an abdominal compression pump and apparently that is going to go through her insurance 12/15; continued improvement. 2 small areas on the left and slightly larger 2 small areas on the right. 12/22; patient still has open areas on the right lateral calf which are superficial and the new one on the right anterior calf. She did not use her compression pumps this week. Her left leg is wrapped by our lymphedema specialist. There are no open wounds here 12/29; she still has a superficial area on the right lateral calf and area on the right medial calf. Right anterior wound area appears to have closed. She has much better edema control today than last week 04/11/2019; the patient is here with severe bilateral lymphedema, chronic venous insufficiency with marked skin changes in her lower extremities. We are wrapping her right leg and her lymphedema specialist is putting lymphedema wraps on the left. She states she is using her compression pumps although in the past her compliance with uses been limited because of osteoarthritic pain in her knees. She only has one remaining area on the right leg however she has rapid injuries on the left at the upper level what where I think her wraps were the left lateral abdomen area left posterior. These would be new this week Objective Constitutional Patient is hypertensive.. Pulse regular and within target range for patient.Marland Kitchen Respirations regular, non-labored and within target range.. Temperature is normal and within the target range for the patient.Marland Kitchen Appears in no distress. Vitals Time Taken: 1:15 PM, Height: 71 in, Weight:  240 lbs, BMI: 33.5, Temperature: 97.8 F, Pulse: 66 bpm, Respiratory Rate: 19 breaths/min, Blood Pressure: 168/76 mmHg. Respiratory work of breathing is normal. Cardiovascular pedal pulses are palpable. edema control is marginal. General Notes: wound exam ooNew open wounds on the left lateral and left posterior calf these were in lymphedema wraps. ooThere is one small open area on the right lateral calf. Integumentary (Hair, Skin) no evidence of cellulitis or DVT in the. Wound #28 status is Healed - Epithelialized. Original cause of wound was Gradually Appeared. The wound is located on the Right,Lateral Lower Leg. The wound measures 0cm length x 0cm width x 0cm depth; 0cm^2 area and 0cm^3 volume. There is no tunneling or undermining noted. There is a none present amount of drainage noted. The wound margin is flat and intact. There is no granulation within the wound bed. There is no necrotic tissue within the wound bed. Wound #38 status is Open. Original cause of wound was Gradually Appeared. The wound is located on the Left,Posterior Lower Leg. The wound measures 1.3cm length x 5cm width x 0.1cm depth; 5.105cm^2 area and 0.511cm^3 volume. There is Fat Layer (Subcutaneous Tissue) Exposed exposed. There is no tunneling or undermining noted. There is a small amount of serosanguineous drainage noted. The wound margin is distinct with the outline attached to the wound base. There is large (67-100%) red, pink granulation within the wound bed. There is no necrotic tissue within  the wound bed. Wound #39 status is Open. Original cause of wound was Gradually Appeared. The wound is located on the Left,Anterior Lower Leg. The wound measures 0.5cm length x 0.4cm width x 0.1cm depth; 0.157cm^2 area and 0.016cm^3 volume. There is Fat Layer (Subcutaneous Tissue) Exposed exposed. There is no tunneling or undermining noted. There is a small amount of drainage noted. The wound margin is distinct with the  outline attached to the wound base. There is large (67-100%) pink granulation within the wound bed. There is a small (1- 33%) amount of necrotic tissue within the wound bed including Adherent Slough. Assessment Active Problems ICD-10 Lymphedema, not elsewhere classified Non-pressure chronic ulcer of left calf limited to breakdown of skin Non-pressure chronic ulcer of right calf limited to breakdown of skin Type 2 diabetes mellitus with other skin ulcer Procedures There was a Four Layer Compression Therapy Procedure by Carlene Coria, RN. Post procedure Diagnosis Wound #: Same as Pre-Procedure Plan Follow-up Appointments: Return Appointment in 1 week. Nurse Visit: - Thursday for Lymphedema Dressing Change Frequency: Wound #38 Left,Posterior Lower Leg: Do not change entire dressing for one week. Wound #39 Left,Anterior Lower Leg: Do not change entire dressing for one week. Skin Barriers/Peri-Wound Care: Barrier cream Moisturizing lotion TCA Cream or Ointment Wound Cleansing: May shower and wash wound with soap and water. Primary Wound Dressing: Wound #38 Left,Posterior Lower Leg: Calcium Alginate with Silver Wound #39 Left,Anterior Lower Leg: Calcium Alginate with Silver Secondary Dressing: Wound #38 Left,Posterior Lower Leg: Kerlix/Rolled Gauze - PT to apply lymphedema wrap over dressing ABD pad Wound #39 Left,Anterior Lower Leg: Kerlix/Rolled Gauze - PT to apply lymphedema wrap over dressing ABD pad Edema Control: 4 layer compression - Right Lower Extremity - unna boot at top to help secure wrap Other: - left leg: PT to apply lymphedema wraps #1 this continues to be a difficult issue and I'm not really sure how this will translate well into appears self- management at home #2 she claims to be using her compression pumps although her compliance with this in the past has been less than optimal. #3 I believe we are in the process of getting her compression sleeves that  extend up into the abdomen which might really help the entire situation. #4 she complains of some discomfort. She has osteoarthritic pain from severe osteoarthritis in her knees. I don't believe she has an arterial issue although her ABIs have always been noncompressible. Electronic Signature(s) Signed: 04/17/2019 6:04:26 PM By: Linton Ham MD Signed: 06/01/2019 2:23:51 PM By: Levan Hurst RN, BSN Previous Signature: 04/14/2019 5:14:53 AM Version By: Linton Ham MD Entered By: Levan Hurst on 04/17/2019 08:28:21 -------------------------------------------------------------------------------- SuperBill Details Patient Name: Date of Service: [name] [name], [name] [name] 04/11/2019 Medical Record [id number] Patient Account Number: [id number] Date of Birth/Sex: Treating RN: 06/13/1945 (74 y.o. [name] Primary Care Provider: [name] Other Clinician: Referring Provider: Treating Provider/Extender:Kie Calvin, Tenna Child, [place] Siren in Treatment: 13 Diagnosis Coding ICD-10 Codes Code Description I89.0 Lymphedema, not elsewhere classified L97.221 Non-pressure chronic ulcer of left calf limited to breakdown of skin L97.211 Non-pressure chronic ulcer of right calf limited to breakdown of skin E11.622 Type 2 diabetes mellitus with other skin ulcer Facility Procedures CPT4 Code Description: [id number] (Facility Use Only) [phone] - APPLY MULTLAY COMPRS LWR LT LEG Modifier: Quantity: 1 Physician Procedures CPT4 Code Description: [id number] - WC PHYS LEVEL 3 - EST PT ICD-10 Diagnosis Description I89.0 Lymphedema, not elsewhere classified L97.221 Non-pressure chronic ulcer of left calf limited to br L97.211 Non-pressure chronic ulcer  of right calf limited  to b Modifier: eakdown of skin reakdown of skin Quantity: 1 Electronic Signature(s) Signed: 04/14/2019 5:14:53 AM By: Linton Ham MD Previous Signature: 04/11/2019 5:08:58 PM Version By: Carlene Coria RN Entered  By: Linton Ham on 04/14/2019 05:14:22

## 2019-04-18 ENCOUNTER — Ambulatory Visit: Payer: PPO

## 2019-04-18 ENCOUNTER — Encounter (HOSPITAL_BASED_OUTPATIENT_CLINIC_OR_DEPARTMENT_OTHER): Payer: PPO | Admitting: Internal Medicine

## 2019-04-18 ENCOUNTER — Other Ambulatory Visit: Payer: Self-pay

## 2019-04-18 DIAGNOSIS — I872 Venous insufficiency (chronic) (peripheral): Secondary | ICD-10-CM | POA: Diagnosis not present

## 2019-04-18 DIAGNOSIS — I890 Lymphedema, not elsewhere classified: Secondary | ICD-10-CM | POA: Diagnosis not present

## 2019-04-18 DIAGNOSIS — S81802A Unspecified open wound, left lower leg, initial encounter: Secondary | ICD-10-CM | POA: Diagnosis not present

## 2019-04-18 DIAGNOSIS — R2689 Other abnormalities of gait and mobility: Secondary | ICD-10-CM

## 2019-04-18 NOTE — Progress Notes (Signed)
[name], [name] ([id number]) Visit Report for 04/18/2019 HPI Details Patient Name: Date of Service: [name] [name] 04/18/2019 1:15 PM Medical Record [id number] Patient Account Number: [id number] Date of Birth/Sex: Treating RN: 1945/05/26 (74 y.o. F) Primary Care Provider: Pricilla [name] Other Clinician: Referring Provider: Treating Provider/Extender:[name] [name], [name] [name], [name] [name] in Treatment: 14 History of Present Illness HPI Description: 04/09/15; the patient is here for review of wounds on her bilateral lower extremities for the last month. The patient has a history of lymphedema and bilateral chronic venous insufficiency with inflammation she was here for review of wounds on her bilateral legs in 2014 she was discharged home with external compression pumps which she is currently not using. Over the last month she developed wounds on her bilateral lower legs with drainage and pain and she is here for our review of this. 04/16/15; the patient's edema is under much better control. She has 3 areas one on the right anterior medial leg one on the left posterior leg and a small open area with purulent drainage on the left anterior leg. I have cultured the area on the left anterior leg 04/23/15; continued improvement in the patient's edema. All her wounds of closed I see no open areas. Culture from the left anterior leg last time was negative. READMISSION 02/07/16; this patient is a woman that I saw her earlier in 2017. I believe we discharged her very quickly with bilateral wounds in her legs secondary to chronic venous hypertension with inflammation and secondary lymphedema. She had juxtalite stockings. She has external compression pumps at home from a stay in our clinic in 2014. She tells me that she's had wounds on her bilateral lower legs since March or April of this year. 3 wounds on the left 2 on the right. She is a type II diabetic and has a history of  noncompressible vessels bilaterally although she is tolerated 4-layer compression. The last note from her primary physician Dr. Pricilla [name] was in May. At that point Dr. Sharlet [name] had referred the patient here for lymphedema management. The patient stated she made a phone call to year but was not able to arrange an appointment. It is not clear that she is actually using anything on the wounds currently except some gauze that was saturated with drainage per our intake nurse. She is certainly not using her juxtalite stockings and by the patient's own admission she is not using her compression pumps because they are "irritating". I don't see a recent hemoglobin A1c. The patient states she has not been systemically unwell but the legs are painful 02/14/16; patient tolerated her Profore wraps reasonably well. She is using the compression pumps 1 time per day. Measurements of her legs are better in terms of the amount of lymphedema 02/21/16; patient tolerated her Profore wraps it. She says she is using her compression pumps one or 2 times a day. Measurements of her leg circumference are improved and most of her wound dimensions are better, unfortunately the home health that we referred her to did not except her insurance but works successful in getting her accompanied that would except her insurance however she apparently has a $25 co-pay which she cannot afford as she is on a fixed income 03/05/16; patient states she did not back using the pumps. She is healed that 2 wounds on the left leg. She still has areas on the right lateral lower leg, right medial lower leg and left lateral lower leg. All of these look some better and have reduced in  area 03/13/16; still 3 wounds 1 on the left lateral leg, one on the left medial/posterior leg which is really the most extensive and a small area on the right lateral leg. All of this in the setting of severe chronic lymphedema and chronic  venous inflammation. Damage to the skin with cobblestone thickened skin. 03/20/16; still 2 areas. The area on the left lateral leg appears to of closed over. The right medial leg is still most extensive wound although it appears to be closing over right lateral only a small open area remains. Using silver alginate Profore wraps 03/27/16; the area on the left leg remains closed. The edema control is better on the left than the right. The right medial leg is still weeping but everything appears to be epithelialized. The lateral aspect of the right leg appears closed. She tells me she has old juxtalite stockings. She has been compliant with her compression pumps. 04/03/16; the area on the left leg remains closed. She has ordered new juxtalite stockings. The right posterior medial wound is fully epithelialized. However she has an irritated area over the right Achilles that she states is been itching all week under the wraps.. She has been compliant with her compression pumps 04/10/16; the area on the left leg remains closed. The right posterior medial wound is also closed today. The irritated area over the right Achilles which was probably wrap injury is also fully epithelialized and closed. The patient has not received her new juxtalite stockings, she arrives in clinic today with an old juxtalite stocking in place. Sheis using her compression pumps secondary to her severe stage III lymphedema READMISSION 01/21/17; this is a patient we know from several previous stays in this clinic. She has lymphedema with chronic venous insufficiency and inflammation. The last time she was here we discharged her with bilateral juxta lites which she is compliant with. She also has external compression pumps at home from stays in our clinic in 2014 although she tells me that she is not very compliant with these as when she uses them a causes pain in her bilateral knees the patient states that her wounds on her left  greater than right leg including 2 large areas on the left anterior and left lateral leg and an area on the right leg open in late June or July since then she is been followed by [facility] and she's had bilateral Unna boot supplied every week for the last month.. I think the wounds have actually improved although they are not specifically dressed. She states she had an ultrasound to rule out DVT bilaterally that was negative. She does not have arterial studies although she is a diabetic. She states she has a lot of pain in her legs she states she does not use her external compression pumps because it causes pain in her knees. As usual her ABIs were noncompressible bilaterally in this clinic 01/28/17; currently the patient only has one small wound on the left lateral leg and I think this should be healed next week. We are going to try to get her bilateral extremitease stockings. She tells me that compression pumps heard her posterior rib arthritic knees so she is not been compliant with this 02/04/17; the patient's wounds are totally healed. She has chronic venous insufficiency and secondary lymphedema. She has new extremitease stockings. She also has compression pumps Readmission: 04/21/17 on evaluation today patient appears to be doing about the same as what she was previous when we were evaluating her back  in November 2018. At that point she had completely healed. With that being said she has now reopened and states that she wasn't aware that she was supposed to come back. 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 Subsequently she also states that although she has the compression that we got for her previously the extremitease stockings she nonetheless doesn't always wear these and being noncompliant often has things will reopen as far as ulcers on her lower extremities. No fevers,  chills, nausea, or vomiting noted at this time. Patient has no evidence of dementia. She is having some discomfort in regard to the ulcers at this point. 04/28/17 on evaluation today patient appears to be doing significantly better in regard to her wounds. With that being said she is still having significant discomfort and is not really keen on sharp debridement. She does not appear to have any evidence of infection which is good news I do think the Iodoflex is beneficial for her. Of note her ABI's were found in the system to be on the right 1.21 with a TBI of 0.76 and on the left 1.35 with a TBI of 0.75. In general her swelling appears to be significantly improved however. No fevers, chills, nausea, or vomiting noted at this time. Patient has been tolerating the wraps without complication. She has no evidence of dementia. 05/05/17-she is here in follow-up evaluation for bilateral lower extremity venous and lymphedema ulcers. She states she has not been using her lymphedema pumps secondary to pain at her knees. After discussion it was noted that she uses knee-high lymphedema pumps but does have a pair of thigh-high lymphedema pumps. She was strongly advised to use the thigh-high lymphedema pumps and if necessary reduce the setting for improved tolerance. She reports no change in odor and/or drainage. She continues to tolerate sharp debridement poorly with significant pain, primarily to the right lower extremity. We will continue with current treatment plan, along with improved lymphedema pump use and follow-up next week 05/12/17 on evaluation today patient actually appears to be doing much better in regard to her bilateral lower extremity ulcers. Only the right altar actually requires debridement today. The other two cleaned off nicely in two of the three in fact on the left lower extremity or actually almost completely healed. She does have some weeping noted still the bilateral lower extremities left  greater than right. She still is not able to use her compression pumps even though I have mentioned this several times to her as did [name] [name] last week. No fevers, chills, nausea, or vomiting noted at this time. Patient has been tolerating the dressing changes without complication. Patient has no evidence of dementia 05/19/17 on evaluation today patient appears to be doing very well regard to her bilateral lower extremity ulcers. In fact she seems to be improving and in fact healed in a couple of locations that were giving her trouble last week. Overall I'm extremely pleased with how things have progressed. She still has some discomfort especially in the right lateral lower extremity. Fortunately there does not appear to be any evidence of significant infection which is good news she does tell me that she has used her compression pumps several times although not routinely over the past week I did praise her for this. I do think it will be beneficial and that might even be what's helping with her wounds as far as healing  is concerned at least to a degree. No fevers, chills, nausea, or vomiting noted at this time. Patient has no dementia and no evidence of infection. 05/26/17 on evaluation today patient presents for evaluation concerning her bilateral lower extremity ulcer secondary to lymphedema. Fortunately she appears to be doing very well at this point in time. Specifically her right leg appears to be completely closed and healed. The left leg though there is still a small area of opening overall has healed. She does have a little bit of excoriation at the top or the wraps are because they slid down on her. Otherwise there's no evidence of infection and the other has improved dramatically now that she is not draining as significantly. Patient is having no significant pain she tells me she has been able to use her lymphedema pumps one time a day although that's not all she can tolerate. Even that is  difficult for her. 06/02/17 on evaluation today patient's ulcers appear to be completely closed at this point. She does still note some odor but I believe this is due to the fact she has not been able to wash her legs as well currently. Fortunately she did bring her compression with her today. She does have the EXTREMIT-EASE Compression. With that being said she is somewhat nervous about going back to this as previously she broke out yet again once we discontinue wrapping her. READMISSION 11/23/17; patient is now 74 year old diabetic woman with severe chronic venous insufficiency with lymphedema. We care for her last in this clinic in February 2019 at which time she had chronic venous insufficiency with lymphedema bilateral lower extremity ulcers. We discharged her with bilateral wrap around/extremitease stockings and compression pumps. Is fairly clear she is not using the compression pumps stating it causes pain in her knees. In any case she was admitted to hospital from 8/3 through 8/6 with a several week history of recurrent bilateral ulcers. She was felt to have bilateral cellulitis treated with doxycycline IV. She is still on oral doxycycline and has another 2 days worth. At discharge her white count was 14.1 hemoglobin at 8.9. The patient is a diabetic however her last ABIs in 2018 showed a right ABI of 1.21, left of 1.35. TBI's were 0.76 on the right 0.75 on the left. Posterior tibial artery was monophasic on the right triphasic on the left. Dorsalis pedis triphasic on the right and triphasic on the left. She is not felt to have significant macrovascular disease 12/02/17;significant bilateral lower extremity wounds secondary to severe uncontrolled lymphedema. She did not get home health out to change the wraps. So she kept this on all week. She is telling me she using his her compression pumps once a week. In general all the wounds look better. We've been using silver alginate with secondary  absorptive dressings 4 layer compression 12/10/17; bilateral lower extremity wounds secondary to uncontrolled lymphedema. She tells me she is using her compression pumps on most days. We've been keeping wraps on all week. Using silver alginate. She has wounds on the right lateral calf left medial and left lateral calf. All of this is somewhat better 12/16/17; the patient has 3 small open areas. One on the right lateral calf, one on the left medial calf and the small area on the left lateral calf. She has lymphedema, chronic venous insufficiency with hemosiderin deposition. Very fibrotic distal lower extremity scan with suggestion of an inverted bottle sign appearance to her legs. She has compression pumps but she does not use  them has not used them in the last week. Currently we have her in 4 layer compression, silver alginate to the wounds and this is being changed once by home health. She is noncompliant with the compression pumps because she says it hurts her knees. She also has compression stockings ando Juxta light stockings at home 12/24/17; the patient's right lateral calf is healed as well as the left medial. There is no open wound on the right leg. She still has 2 areas on the left lateral calf. We transitioned the right leg into an extremitease stocking which she brought in from home. 12/31/17; the patient has a superficial wound on the left lateral lower calf. We've been wrapping this leg. She arrives today with the right leg swollen but without a wound. She cannot get her extremities stocking on the right leg. There is a lot more edema here. She is not using her compression pumps 01/10/2018; patient has a same superficial wound on the left lateral calf. Her right leg has less peripheral edema. She tells me she also started herself on some Lasix that had been previously prescribed for her at some point but she had never taken it. She is also concerned about whether her compression pumps are  leaking. She has extremitease stockings however the edema gets bad enough she can get these on. Currently she has no open wound on the right leg and is mention the edema is actually better than when I saw this 10 days ago 01/17/2018; patient has a same superficial area on the posterior left calf and a weeping area in the mid part of the right lateral calf. She still does not have anyone from [facility] coming to see her compression pumps which she describes as being suspicious for a leakage. I am not really sure how frequently she is using this in any case. She has massive bilateral calf lymphedema. I think there is some spreading lymphedema into her posterior thighs. 01/24/2018; the patient's edema in her legs is actually some better. She still has a smaller area on the left posterior calf although this is better. The weeping area on the right lateral calf is also better. As it turned out she did not get her pumps from [facility] but [facility] and they are going to try to go out and see these this week which is going to help. I explained to her that she will use the compression pumps once a day while we are wrapping her legs but when she transitions to stockings that may be she will require pumping twice a day. Her compliance in this area has not been high and I wonder about her ability to do this herself. 02/01/2018; the area on the posterior left leg is healed. She still has the original wound on the right lateral calf which is epithelialized. The however just above this there is still weeping lymphedema fluid. She has a new area on the upper right calf which is either wrap injury or is she points out where she has been scratching under the wraps. We still not have managed to get a hold of a representative of [facility] to see if the pump is operational. She has extremitease stockings at home however I am not of the opinion that this will maintain her skin  integrity without the compression pumps. 02/08/2018; posterior left leg remains healed although she has a new small open area on the left lateral calf. Right lateral calf has two quarter sized open  areas and close juxtaposition. There is less weeping edema fluid. She apparently managed to contact [facility]. They are sending her a part. But she is still not received it 02/15/2018; the patient has no open wound on either leg. Some scale present on the right upper lateral calf just below the fibular head. Edema control is excellent. She did not bring her stockings or her juxta lite wrap around. She has the part for her compression pump but she has not use the compression pumps yet READMISSION 01/10/2019 Patient is now a 73 year old woman that we have had in the clinic multiple times in the past. She has chronic lower extremity lymphedema, recurrent wounds on her bilateral lower extremities. She is also a type II diabetic reasonably well controlled with a recent hemoglobin A1c while she was in the hospital of 5.9. She has wounds on her bilateral lower extremities mostly on the left lateral and right lateral. These were noted also during her recent hospitalization. The patient states they have been there for a while. She has not been able to wear her stockings he has trouble getting them on as she lives alone. She also has external compression pumps but uses them sparingly because it causes pain in her knees which hurt because of osteoarthritis. The patient was on doxycycline before she went into the hospital apparently for fear of infection in her legs and is on Keflex coming out because of a UTI The patient was recently in the hospital from 01/06/2019 through 01/09/2019. She was admitted with nausea and vomiting and prerenal azotemia. This was corrected with IV fluid her Lasix has been put on hold. Hemoglobin A1c at 5.9 her metformin was discontinued. She was also felt to have a UTI she was  prescribed Keflex at 503 times a day she is picking that up today. The patient is not felt to have an arterial issue. ABIs in our clinic were 1.24 on the right and 1.06 on the left. She had formal studies in 2018 at which time her ABIs were 1.21 on the right TBI of 0.76 and on the left 1.35 and 0.75 respectively. Waveforms are on the right were monophasic and biphasic, triphasic and biphasic on the left. She is tolerated 4 layer compression in the past 10/13. Comes in today having not used her compression pumps /perhaps once in the last week. She has too much edema to consider healing these wounds. We have been using silver alginate 10/20; she tells me he had she used her compression pumps once a day as we asked. Clearly she has edema out of her legs although most of it seems to be settling around her knees which is what she complains about. We have been using silver alginate to the extensive de-epithelialized area on the left lateral calf and a much smaller area on the right lateral extending linearly into the right posterior 10/27; she is using her compression pumps daily. Her edema control is better. We have been using silver alginate to the extensive wound areas on her bilateral lower legs. This includes left anterior left lateral and left posterior. Right anterior right lateral and right posterior. The wounds are not all in the same condition. She has severe bilateral skin damage with the epithelialization, flaking dried epithelium 11/3; she is using her compression pumps 1 times daily. Pretty obvious that her wraps fell down especially on the left leg to mid calf. I think things are improving on the right lateral calf, I do not see anything open  posteriorly. The major areas on the left lateral where she has 3 or 4 deeper wounds in the midst of an entire de-epithelialized area. There is erythema here but no tenderness I think this represents venous stasis rather than cellulitis. I debrided  the areas on the left lateral calf last week but once again they have tightly adherent debris over the top of them which is disappointing 11/10; the patient's major areas on the left lateral calf to 3 wounds with some depth surrounded by a large area of de-epithelialized tissue. There is no infection. We have been using silver alginate under 4-layer compression. On the right lateral she has a more contained wound area that is superficial. Been using silver alginate under 4-layer compression 11/17; the patient's major wound is on the left lateral calf. We have been using silver alginate under compression. There is no open wound per se on the right but it de-epithelialized area on the lateral calf that is weeping edema fluid. I do not see much change here today. 12/1; patient arrives in clinic with her wounds looking better. Her edema control is better. The lymphedema specialist working on the left leg we have been working on the right. We have been using 4-layer compression. The patient is using a consider external compression pumps and Hydrofera Blue to the open areas. She is being measured for an abdominal compression pump and apparently that is going to go through her insurance 12/15; continued improvement. 2 small areas on the left and slightly larger 2 small areas on the right. 12/22; patient still has open areas on the right lateral calf which are superficial and the new one on the right anterior calf. She did not use her compression pumps this week. Her left leg is wrapped by our lymphedema specialist. There are no open wounds here 12/29; she still has a superficial area on the right lateral calf and area on the right medial calf. Right anterior wound area appears to have closed. She has much better edema control today than last week 04/11/2019; the patient is here with severe bilateral lymphedema, chronic venous insufficiency with marked skin changes in her lower extremities. We are  wrapping her right leg and her lymphedema specialist is putting lymphedema wraps on the left. She states she is using her compression pumps although in the past her compliance with uses been limited because of osteoarthritic pain in her knees. She only has one remaining area on the right leg however she has wrap injuries on the left at the upper level what where I think her wraps were cause. the left lateral area left posterior. These would be new this week 1/12; generally better. The wrap injuries in the upper calf on the left from last week are down to a single wound. She has 2 wounds anteriorly that are still weeping fluid although these appear to have been epithelialized surface for the most part. There is no open wounds on the right leg we are wrapping her right leg and 4-layer compression and we are going a lymphedema wrap on the left leg. The the approach is a Farrow 2000 wrap on the left leg and then subsequently the right leg. She claims to be compliant with her compression pumps Electronic Signature(s) Signed: 04/18/2019 6:16:53 PM By: [name] Ham MD Entered By: [name] [name] on 04/18/2019 14:58:07 -------------------------------------------------------------------------------- Physical Exam Details Patient Name: Date of Service: [name], [name] 04/18/2019 1:15 PM Medical Record [id number] Patient Account Number: [id number] Date of Birth/Sex: Treating RN: 10-15-1945 (74 y.o.  F) Primary Care Provider: Pricilla [name] Other Clinician: Referring Provider: Treating Provider/Extender:[name] [name], [name] [name], [name] [name] in Treatment: 14 Constitutional Patient is hypertensive.. Pulse regular and within target range for patient.Marland Kitchen Respirations regular, non-labored and within target range.. Temperature is normal and within the target range for the patient.Marland Kitchen Appears in no distress. Cardiovascular Needle pulses are palpable. Edema control is as good as I have seen it  in this woman.. Integumentary (Hair, Skin) Severe changes of chronic venous insufficiency. Notes Wound exam The no open wounds which were wrap injuries on the left lateral and left posterior upper calf have closed except for 1 small open area. Anteriorly she is having lymphedema leak from 2 areas that are largely epithelialized Electronic Signature(s) Signed: 04/18/2019 6:16:53 PM By: [name] Ham MD Entered By: [name] [name] on 04/18/2019 15:01:31 -------------------------------------------------------------------------------- Physician Orders Details Patient Name: Date of Service: [name], [name] 04/18/2019 1:15 PM Medical Record [id number] Patient Account Number: [id number] Date of Birth/Sex: Treating RN: [birth year] (74 y.o. 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 Nurse Visit: - Thursday for Lymphedema Dressing Change Frequency Wound #38 Left,Posterior Lower Leg Do not change entire dressing for one week. Wound #39 Left,Anterior Lower Leg Do not change entire dressing for one week. Skin Barriers/Peri-Wound Care Barrier cream Moisturizing lotion TCA Cream or Ointment Wound Cleansing May shower and wash wound with soap and water. Primary Wound Dressing Wound #38 Left,Posterior Lower Leg Calcium Alginate with Silver Wound #39 Left,Anterior Lower Leg Calcium Alginate with Silver Secondary Dressing Wound #38 Left,Posterior Lower  Leg Kerlix/Rolled Gauze - PT to apply lymphedema wrap over dressing ABD pad Wound #39 Left,Anterior Lower Leg Kerlix/Rolled Gauze - PT to apply lymphedema wrap over dressing ABD pad Edema Control 4 layer compression - Right Lower Extremity - unna boot at top to help secure wrap Other: - left leg: PT to apply lymphedema wraps Electronic Signature(s) Signed: 04/18/2019 6:16:53 PM By: [name] Ham MD Signed: 04/18/2019 6:18:05 PM By: [name] Coria RN Entered By: [name] [name] on 04/18/2019 14:22:28 -------------------------------------------------------------------------------- Problem List Details Patient Name: Date of Service: [name] [name], JAGGER 04/18/2019 1:15 PM Medical Record [id number] Patient Account Number: [id number] Date of Birth/Sex: Treating RN: 11-27-1945 (74 y.o. [place] Lo, [place] Primary Care Provider: Pricilla [name] Other Clinician: Referring Provider: Treating Provider/Extender:[name] [name], [name] [name], [name] [name] in Treatment: 14 Active Problems ICD-10 Evaluated Encounter Code Description Active Date Today Diagnosis I89.0 Lymphedema, not elsewhere classified 01/10/2019 No Yes L97.221 Non-pressure chronic ulcer of left calf limited to 01/10/2019 No Yes breakdown of skin L97.211 Non-pressure chronic ulcer of right calf limited to 01/10/2019 No Yes breakdown of skin E11.622 Type 2 diabetes mellitus with other skin ulcer 01/10/2019 No Yes Inactive Problems Resolved Problems Electronic Signature(s) Signed: 04/18/2019 6:16:53 PM By: [name] Ham MD Entered By: [name] [name] on 04/18/2019 14:53:31 -------------------------------------------------------------------------------- Progress Note Details Patient Name: Date of Service: [name], [name] 04/18/2019 1:15 PM Medical Record [id number] Patient Account Number: [id number] Date of Birth/Sex: Treating RN: [birth year] (74 y.o. F) Primary Care Provider: Pricilla [name] Other  Clinician: Referring Provider: Treating Provider/Extender:[name] [name], [name] [name], [name] [name] in Treatment: 14 Subjective History of Present Illness (HPI) 04/09/15;  the patient is here for review of wounds on her bilateral lower extremities for the last month. The patient has a history of lymphedema and bilateral chronic venous insufficiency with inflammation she was here for review of wounds on her bilateral legs in 2014 she was discharged home with external compression pumps which she is currently not using. Over the last month she developed wounds on her bilateral lower legs with drainage and pain and she is here for our review of this. 04/16/15; the patient's edema is under much better control. She has 3 areas one on the right anterior medial leg one on the left posterior leg and a small open area with purulent drainage on the left anterior leg. I have cultured the area on the left anterior leg 04/23/15; continued improvement in the patient's edema. All her wounds of closed I see no open areas. Culture from the left anterior leg last time was negative. READMISSION 02/07/16; this patient is a woman that I saw her earlier in 2017. I believe we discharged her very quickly with bilateral wounds in her legs secondary to chronic venous hypertension with inflammation and secondary lymphedema. She had juxtalite stockings. She has external compression pumps at home from a stay in our clinic in 2014. She tells me that she's had wounds on her bilateral lower legs since March or April of this year. 3 wounds on the left 2 on the right. She is a type II diabetic and has a history of noncompressible vessels bilaterally although she is tolerated 4-layer compression. The last note from her primary physician Dr. Pricilla [name] was in May. At that point Dr. Sharlet [name] had referred the patient here for lymphedema management. The patient stated she made a phone call to year but was not able to arrange an  appointment. It is not clear that she is actually using anything on the wounds currently except some gauze that was saturated with drainage per our intake nurse. She is certainly not using her juxtalite stockings and by the patient's own admission she is not using her compression pumps because they are "irritating". I don't see a recent hemoglobin A1c. The patient states she has not been systemically unwell but the legs are painful 02/14/16; patient tolerated her Profore wraps reasonably well. She is using the compression pumps 1 time per day. Measurements of her legs are better in terms of the amount of lymphedema 02/21/16; patient tolerated her Profore wraps it. She says she is using her compression pumps one or 2 times a day. Measurements of her leg circumference are improved and most of her wound dimensions are better, unfortunately the home health that we referred her to did not except her insurance but works successful in getting her accompanied that would except her insurance however she apparently has a $25 co-pay which she cannot afford as she is on a fixed income 03/05/16; patient states she did not back using the pumps. She is healed that 2 wounds on the left leg. She still has areas on the right lateral lower leg, right medial lower leg and left lateral lower leg. All of these look some better and have reduced in area 03/13/16; still 3 wounds 1 on the left lateral leg, one on the left medial/posterior leg which is really the most extensive and a small area on the right lateral leg. All of this in the setting of severe chronic lymphedema and chronic venous inflammation. Damage to the skin with cobblestone thickened skin. 03/20/16; still 2 areas. The area  on the left lateral leg appears to of closed over. The right medial leg is still most extensive wound although it appears to be closing over right lateral only a small open area remains. Using silver alginate Profore wraps 03/27/16;  the area on the left leg remains closed. The edema control is better on the left than the right. The right medial leg is still weeping but everything appears to be epithelialized. The lateral aspect of the right leg appears closed. She tells me she has old juxtalite stockings. She has been compliant with her compression pumps. 04/03/16; the area on the left leg remains closed. She has ordered new juxtalite stockings. The right posterior medial wound is fully epithelialized. However she has an irritated area over the right Achilles that she states is been itching all week under the wraps.. She has been compliant with her compression pumps 04/10/16; the area on the left leg remains closed. The right posterior medial wound is also closed today. The irritated area over the right Achilles which was probably wrap injury is also fully epithelialized and closed. The patient has not received her new juxtalite stockings, she arrives in clinic today with an old juxtalite stocking in place. Sheis using her compression pumps secondary to her severe stage III lymphedema READMISSION 01/21/17; this is a patient we know from several previous stays in this clinic. She has lymphedema with chronic venous insufficiency and inflammation. The last time she was here we discharged her with bilateral juxta lites which she is compliant with. She also has external compression pumps at home from stays in our clinic in 2014 although she tells me that she is not very compliant with these as when she uses them a causes pain in her bilateral knees the patient states that her wounds on her left greater than right leg including 2 large areas on the left anterior and left lateral leg and an area on the right leg open in late June or July since then she is been followed by [facility] and she's had bilateral Unna boot supplied every week for the last month.. I think the wounds have actually improved although they are not specifically  dressed. She states she had an ultrasound to rule out DVT bilaterally that was negative. She does not have arterial studies although she is a diabetic. She states she has a lot of pain in her legs she states she does not use her external compression pumps because it causes pain in her knees. As usual her ABIs were noncompressible bilaterally in this clinic 01/28/17; currently the patient only has one small wound on the left lateral leg and I think this should be healed next week. We are going to try to get her bilateral extremitease stockings. She tells me that compression pumps heard her posterior rib arthritic knees so she is not been compliant with this 02/04/17; the patient's wounds are totally healed. She has chronic venous insufficiency and secondary lymphedema. She has new extremitease stockings. She also has compression pumps Readmission: 04/21/17 on evaluation today patient appears to be doing about the same as what she was previous when we were evaluating her back in November 2018. At that point she had completely healed. With that being said she has now reopened and states that she wasn't aware that she was supposed to come back. 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 Subsequently she also states  that although she has the compression that we got for her previously the extremitease stockings she nonetheless doesn't always wear these and being noncompliant often has things will reopen as far as ulcers on her lower extremities. No fevers, chills, nausea, or vomiting noted at this time. Patient has no evidence of dementia. She is having some discomfort in regard to the ulcers at this point. 04/28/17 on evaluation today patient appears to be doing significantly better in regard to her wounds. With that being said she is still having significant discomfort and is not really keen on  sharp debridement. She does not appear to have any evidence of infection which is good news I do think the Iodoflex is beneficial for her. Of note her ABI's were found in the system to be on the right 1.21 with a TBI of 0.76 and on the left 1.35 with a TBI of 0.75. In general her swelling appears to be significantly improved however. No fevers, chills, nausea, or vomiting noted at this time. Patient has been tolerating the wraps without complication. She has no evidence of dementia. 05/05/17-she is here in follow-up evaluation for bilateral lower extremity venous and lymphedema ulcers. She states she has not been using her lymphedema pumps secondary to pain at her knees. After discussion it was noted that she uses knee-high lymphedema pumps but does have a pair of thigh-high lymphedema pumps. She was strongly advised to use the thigh-high lymphedema pumps and if necessary reduce the setting for improved tolerance. She reports no change in odor and/or drainage. She continues to tolerate sharp debridement poorly with significant pain, primarily to the right lower extremity. We will continue with current treatment plan, along with improved lymphedema pump use and follow-up next week 05/12/17 on evaluation today patient actually appears to be doing much better in regard to her bilateral lower extremity ulcers. Only the right altar actually requires debridement today. The other two cleaned off nicely in two of the three in fact on the left lower extremity or actually almost completely healed. She does have some weeping noted still the bilateral lower extremities left greater than right. She still is not able to use her compression pumps even though I have mentioned this several times to her as did [name] [name] last week. No fevers, chills, nausea, or vomiting noted at this time. Patient has been tolerating the dressing changes without complication. Patient has no evidence of dementia 05/19/17 on evaluation today  patient appears to be doing very well regard to her bilateral lower extremity ulcers. In fact she seems to be improving and in fact healed in a couple of locations that were giving her trouble last week. Overall I'm extremely pleased with how things have progressed. She still has some discomfort especially in the right lateral lower extremity. Fortunately there does not appear to be any evidence of significant infection which is good news she does tell me that she has used her compression pumps several times although not routinely over the past week I did praise her for this. I do think it will be beneficial and that might even be what's helping with her wounds as far as healing is concerned at least to a degree. No fevers, chills, nausea, or vomiting noted at this time. Patient has no dementia and no evidence of infection. 05/26/17 on evaluation today patient presents for evaluation concerning her bilateral lower extremity ulcer secondary to lymphedema. Fortunately she appears to be doing very well at this point in time. Specifically her right leg  appears to be completely closed and healed. The left leg though there is still a small area of opening overall has healed. She does have a little bit of excoriation at the top or the wraps are because they slid down on her. Otherwise there's no evidence of infection and the other has improved dramatically now that she is not draining as significantly. Patient is having no significant pain she tells me she has been able to use her lymphedema pumps one time a day although that's not all she can tolerate. Even that is difficult for her. 06/02/17 on evaluation today patient's ulcers appear to be completely closed at this point. She does still note some odor but I believe this is due to the fact she has not been able to wash her legs as well currently. Fortunately she did bring her compression with her today. She does have the EXTREMIT-EASE Compression. With  that being said she is somewhat nervous about going back to this as previously she broke out yet again once we discontinue wrapping her. READMISSION 11/23/17; patient is now 74 year old diabetic woman with severe chronic venous insufficiency with lymphedema. We care for her last in this clinic in February 2019 at which time she had chronic venous insufficiency with lymphedema bilateral lower extremity ulcers. We discharged her with bilateral wrap around/extremitease stockings and compression pumps. Is fairly clear she is not using the compression pumps stating it causes pain in her knees. In any case she was admitted to hospital from 8/3 through 8/6 with a several week history of recurrent bilateral ulcers. She was felt to have bilateral cellulitis treated with doxycycline IV. She is still on oral doxycycline and has another 2 days worth. At discharge her white count was 14.1 hemoglobin at 8.9. The patient is a diabetic however her last ABIs in 2018 showed a right ABI of 1.21, left of 1.35. TBI's were 0.76 on the right 0.75 on the left. Posterior tibial artery was monophasic on the right triphasic on the left. Dorsalis pedis triphasic on the right and triphasic on the left. She is not felt to have significant macrovascular disease 12/02/17;significant bilateral lower extremity wounds secondary to severe uncontrolled lymphedema. She did not get home health out to change the wraps. So she kept this on all week. She is telling me she using his her compression pumps once a week. In general all the wounds look better. We've been using silver alginate with secondary absorptive dressings 4 layer compression 12/10/17; bilateral lower extremity wounds secondary to uncontrolled lymphedema. She tells me she is using her compression pumps on most days. We've been keeping wraps on all week. Using silver alginate. She has wounds on the right lateral calf left medial and left lateral calf. All of this is somewhat  better 12/16/17; the patient has 3 small open areas. One on the right lateral calf, one on the left medial calf and the small area on the left lateral calf. She has lymphedema, chronic venous insufficiency with hemosiderin deposition. Very fibrotic distal lower extremity scan with suggestion of an inverted bottle sign appearance to her legs. She has compression pumps but she does not use them has not used them in the last week. Currently we have her in 4 layer compression, silver alginate to the wounds and this is being changed once by home health. She is noncompliant with the compression pumps because she says it hurts her knees. She also has compression stockings ando Juxta light stockings at home 12/24/17; the patient's right  lateral calf is healed as well as the left medial. There is no open wound on the right leg. She still has 2 areas on the left lateral calf. We transitioned the right leg into an extremitease stocking which she brought in from home. 12/31/17; the patient has a superficial wound on the left lateral lower calf. We've been wrapping this leg. She arrives today with the right leg swollen but without a wound. She cannot get her extremities stocking on the right leg. There is a lot more edema here. She is not using her compression pumps 01/10/2018; patient has a same superficial wound on the left lateral calf. Her right leg has less peripheral edema. She tells me she also started herself on some Lasix that had been previously prescribed for her at some point but she had never taken it. She is also concerned about whether her compression pumps are leaking. She has extremitease stockings however the edema gets bad enough she can get these on. Currently she has no open wound on the right leg and is mention the edema is actually better than when I saw this 10 days ago 01/17/2018; patient has a same superficial area on the posterior left calf and a weeping area in the mid part of  the right lateral calf. She still does not have anyone from [facility] coming to see her compression pumps which she describes as being suspicious for a leakage. I am not really sure how frequently she is using this in any case. She has massive bilateral calf lymphedema. I think there is some spreading lymphedema into her posterior thighs. 01/24/2018; the patient's edema in her legs is actually some better. She still has a smaller area on the left posterior calf although this is better. The weeping area on the right lateral calf is also better. As it turned out she did not get her pumps from [facility] but [facility] and they are going to try to go out and see these this week which is going to help. I explained to her that she will use the compression pumps once a day while we are wrapping her legs but when she transitions to stockings that may be she will require pumping twice a day. Her compliance in this area has not been high and I wonder about her ability to do this herself. 02/01/2018; the area on the posterior left leg is healed. She still has the original wound on the right lateral calf which is epithelialized. The however just above this there is still weeping lymphedema fluid. She has a new area on the upper right calf which is either wrap injury or is she points out where she has been scratching under the wraps. We still not have managed to get a hold of a representative of [facility] to see if the pump is operational. She has extremitease stockings at home however I am not of the opinion that this will maintain her skin integrity without the compression pumps. 02/08/2018; posterior left leg remains healed although she has a new small open area on the left lateral calf. Right lateral calf has two quarter sized open areas and close juxtaposition. There is less weeping edema fluid. She apparently managed to contact [facility]. They are sending her a  part. But she is still not received it 02/15/2018; the patient has no open wound on either leg. Some scale present on the right upper lateral calf just below the fibular head. Edema control is excellent. She did  not bring her stockings or her juxta lite wrap around. She has the part for her compression pump but she has not use the compression pumps yet READMISSION 01/10/2019 Patient is now a 74 year old woman that we have had in the clinic multiple times in the past. She has chronic lower extremity lymphedema, recurrent wounds on her bilateral lower extremities. She is also a type II diabetic reasonably well controlled with a recent hemoglobin A1c while she was in the hospital of 5.9. She has wounds on her bilateral lower extremities mostly on the left lateral and right lateral. These were noted also during her recent hospitalization. The patient states they have been there for a while. She has not been able to wear her stockings he has trouble getting them on as she lives alone. She also has external compression pumps but uses them sparingly because it causes pain in her knees which hurt because of osteoarthritis. The patient was on doxycycline before she went into the hospital apparently for fear of infection in her legs and is on Keflex coming out because of a UTI The patient was recently in the hospital from 01/06/2019 through 01/09/2019. She was admitted with nausea and vomiting and prerenal azotemia. This was corrected with IV fluid her Lasix has been put on hold. Hemoglobin A1c at 5.9 her metformin was discontinued. She was also felt to have a UTI she was prescribed Keflex at 503 times a day she is picking that up today. The patient is not felt to have an arterial issue. ABIs in our clinic were 1.24 on the right and 1.06 on the left. She had formal studies in 2018 at which time her ABIs were 1.21 on the right TBI of 0.76 and on the left 1.35 and 0.75 respectively. Waveforms are on the right  were monophasic and biphasic, triphasic and biphasic on the left. She is tolerated 4 layer compression in the past 10/13. Comes in today having not used her compression pumps /perhaps once in the last week. She has too much edema to consider healing these wounds. We have been using silver alginate 10/20; she tells me he had she used her compression pumps once a day as we asked. Clearly she has edema out of her legs although most of it seems to be settling around her knees which is what she complains about. We have been using silver alginate to the extensive de-epithelialized area on the left lateral calf and a much smaller area on the right lateral extending linearly into the right posterior 10/27; she is using her compression pumps daily. Her edema control is better. We have been using silver alginate to the extensive wound areas on her bilateral lower legs. This includes left anterior left lateral and left posterior. Right anterior right lateral and right posterior. The wounds are not all in the same condition. She has severe bilateral skin damage with the epithelialization, flaking dried epithelium 11/3; she is using her compression pumps 1 times daily. Pretty obvious that her wraps fell down especially on the left leg to mid calf. I think things are improving on the right lateral calf, I do not see anything open posteriorly. The major areas on the left lateral where she has 3 or 4 deeper wounds in the midst of an entire de-epithelialized area. There is erythema here but no tenderness I think this represents venous stasis rather than cellulitis. I debrided the areas on the left lateral calf last week but once again they have tightly adherent debris over  the top of them which is disappointing 11/10; the patient's major areas on the left lateral calf to 3 wounds with some depth surrounded by a large area of de-epithelialized tissue. There is no infection. We have been using silver alginate under  4-layer compression. On the right lateral she has a more contained wound area that is superficial. Been using silver alginate under 4-layer compression 11/17; the patient's major wound is on the left lateral calf. We have been using silver alginate under compression. There is no open wound per se on the right but it de-epithelialized area on the lateral calf that is weeping edema fluid. I do not see much change here today. 12/1; patient arrives in clinic with her wounds looking better. Her edema control is better. The lymphedema specialist working on the left leg we have been working on the right. We have been using 4-layer compression. The patient is using a consider external compression pumps and Hydrofera Blue to the open areas. She is being measured for an abdominal compression pump and apparently that is going to go through her insurance 12/15; continued improvement. 2 small areas on the left and slightly larger 2 small areas on the right. 12/22; patient still has open areas on the right lateral calf which are superficial and the new one on the right anterior calf. She did not use her compression pumps this week. Her left leg is wrapped by our lymphedema specialist. There are no open wounds here 12/29; she still has a superficial area on the right lateral calf and area on the right medial calf. Right anterior wound area appears to have closed. She has much better edema control today than last week 04/11/2019; the patient is here with severe bilateral lymphedema, chronic venous insufficiency with marked skin changes in her lower extremities. We are wrapping her right leg and her lymphedema specialist is putting lymphedema wraps on the left. She states she is using her compression pumps although in the past her compliance with uses been limited because of osteoarthritic pain in her knees. She only has one remaining area on the right leg however she has wrap injuries on the left at the upper  level what where I think her wraps were cause. the left lateral area left posterior. These would be new this week 1/12; generally better. The wrap injuries in the upper calf on the left from last week are down to a single wound. She has 2 wounds anteriorly that are still weeping fluid although these appear to have been epithelialized surface for the most part. There is no open wounds on the right leg we are wrapping her right leg and 4-layer compression and we are going a lymphedema wrap on the left leg. The the approach is a [place] 2000 wrap on the left leg and then subsequently the right leg. She claims to be compliant with her compression pumps Objective Constitutional Patient is hypertensive.. Pulse regular and within target range for patient.Marland Kitchen Respirations regular, non-labored and within target range.. Temperature is normal and within the target range for the patient.Marland Kitchen Appears in no distress. Vitals Time Taken: 2:16 PM, Height: 71 in, Source: Stated, Weight: 240 lbs, Source: Stated, BMI: 33.5, Temperature: 97.6 F, Pulse: 59 bpm, Respiratory Rate: 18 breaths/min, Blood Pressure: 162/73 mmHg. Cardiovascular Needle pulses are palpable. Edema control is as good as I have seen it in this woman.. General Notes: Wound exam ooThe no open wounds which were wrap injuries on the left lateral and left posterior upper calf have  closed except for 1 small open area. ooAnteriorly she is having lymphedema leak from 2 areas that are largely epithelialized Integumentary (Hair, Skin) Severe changes of chronic venous insufficiency. Wound #38 status is Open. Original cause of wound was Gradually Appeared. The wound is located on the Left,Posterior Lower Leg. The wound measures 0.3cm length x 0.3cm width x 0.1cm depth; 0.071cm^2 area and 0.007cm^3 volume. There is Fat Layer (Subcutaneous Tissue) Exposed exposed. There is no tunneling or undermining noted. There is a small amount of serosanguineous drainage  noted. The wound margin is distinct with the outline attached to the wound base. There is large (67-100%) red, pink granulation within the wound bed. There is no necrotic tissue within the wound bed. Wound #39 status is Open. Original cause of wound was Gradually Appeared. The wound is located on the Left,Anterior Lower Leg. The wound measures 0.2cm length x 0.2cm width x 0.1cm depth; 0.031cm^2 area and 0.003cm^3 volume. The wound is limited to skin breakdown. There is no tunneling or undermining noted. There is a medium amount of serous drainage noted. The wound margin is distinct with the outline attached to the wound base. There is large (67-100%) pink granulation within the wound bed. There is no necrotic tissue within the wound bed. Assessment Active Problems ICD-10 Lymphedema, not elsewhere classified Non-pressure chronic ulcer of left calf limited to breakdown of skin Non-pressure chronic ulcer of right calf limited to breakdown of skin Type 2 diabetes mellitus with other skin ulcer Procedures There was a Four Layer Compression Therapy Procedure by [name] Coria, RN. Post procedure Diagnosis Wound #: Same as Pre-Procedure Plan Follow-up Appointments: Return Appointment in 1 week. Nurse Visit: - Thursday for Lymphedema Dressing Change Frequency: Wound #38 Left,Posterior Lower Leg: Do not change entire dressing for one week. Wound #39 Left,Anterior Lower Leg: Do not change entire dressing for one week. Skin Barriers/Peri-Wound Care: Barrier cream Moisturizing lotion TCA Cream or Ointment Wound Cleansing: May shower and wash wound with soap and water. Primary Wound Dressing: Wound #38 Left,Posterior Lower Leg: Calcium Alginate with Silver Wound #39 Left,Anterior Lower Leg: Calcium Alginate with Silver Secondary Dressing: Wound #38 Left,Posterior Lower Leg: Kerlix/Rolled Gauze - PT to apply lymphedema wrap over dressing ABD pad Wound #39 Left,Anterior Lower  Leg: Kerlix/Rolled Gauze - PT to apply lymphedema wrap over dressing ABD pad Edema Control: 4 layer compression - Right Lower Extremity - unna boot at top to help secure wrap Other: - left leg: PT to apply lymphedema wraps 1. No signs of systemic fluid volume overload 2. Severe bilateral chronic venous insufficiency with secondary lymphedema. The right leg is closed but will still wrap her today. She has a juxta light but it may be worn out. We are using lymphedema wraps on the left leg. She is pumping with her external compression pumps on both sides Electronic Signature(s) Signed: 04/18/2019 6:16:53 PM By: [name] Ham MD Entered By: [name] [name] on 04/18/2019 15:02:36 -------------------------------------------------------------------------------- SuperBill Details Patient Name: Date of Service: CHARLISHA, HAMBERG 04/18/2019 Medical Record [id number] Patient Account Number: [id number] Date of Birth/Sex: Treating RN: Aug 09, 1945 (74 y.o. F) Primary Care Provider: Pricilla [name] Other Clinician: Referring Provider: Treating Provider/Extender:[name] [name], [name] [name], [name] [name] in Treatment: 14 Diagnosis Coding ICD-10 Codes Code Description I89.0 Lymphedema, not elsewhere classified L97.221 Non-pressure chronic ulcer of left calf limited to breakdown of skin L97.211 Non-pressure chronic ulcer of right calf limited to breakdown of skin E11.622 Type 2 diabetes mellitus with other skin ulcer Physician Procedures Electronic Signature(s) Signed: 04/18/2019 6:16:53 PM By: Dellia Nims,  Legrand Como MD Entered By: [name] [name] on 04/18/2019 15:04:25

## 2019-04-18 NOTE — Therapy (Signed)
[place], [place], [address] Phone: [phone]   Fax:  [phone]  Physical Therapy Treatment  Patient Details  Name: [name] [name] MRN: [id number] Date of Birth: 1945-12-21 Referring Provider (PT): Darci Current   Encounter Date: 04/18/2019  PT End of Session - 04/18/19 1503    Visit Number  13    Number of Visits  25    Date for PT Re-Evaluation  05/09/19    PT Start Time  1529    PT Stop Time  1545    PT Time Calculation (min)  16 min    Activity Tolerance  Patient tolerated treatment well    Behavior During Therapy  [facility] for tasks assessed/performed       Past Medical History:  Diagnosis Date  . Acute kidney injury ([place])   . Aortic stenosis, mild 11/17/2013  . Arthritis    "both knees, spine, back, fingers" (11/29/2013)  . CHF (congestive heart failure) ([place])   . Edema   . GERD (gastroesophageal reflux disease)   . Heart murmur   . History of diastolic dysfunction    Echo [id number] with diastolic dysfunction  . HTN (hypertension)   . Morbid obesity ([place])   . OA (osteoarthritis)   . PAC (premature atrial contraction)    per prior Holter  . Palpitations   . Pneumonia    "as a child"  . PVC's (premature ventricular contractions)    per prior Holter  . SVT (supraventricular tachycardia) ([place])   . Tachycardia    noted at 07/28/11 visit. Started on beta blocker. Possible atrial flutter vs long PR tachycardia/AVNRT  . Type II diabetes mellitus ([place])     Past Surgical History:  Procedure Laterality Date  . CESAREAN SECTION  1985  . CESAREAN SECTION  1985  . SUPRAVENTRICULAR TACHYCARDIA ABLATION  11/29/2013  . SUPRAVENTRICULAR TACHYCARDIA ABLATION N/A 11/29/2013   Procedure: SUPRAVENTRICULAR TACHYCARDIA ABLATION;  Surgeon: Evans Lance, MD;  Location: [facility] CATH LAB;  Service: Cardiovascular;  Laterality: N/A;  . US ECHOCARDIOGRAPHY  07/25/2008   EF 55-60%  . VAGINAL DELIVERY       There were no vitals filed for this visit.  Subjective Assessment - 04/18/19 1503    Subjective  Pt reports that she took her wraps off yesterday to wash her short stretch bandages she states that she has something that she needs to be faxed to [facility]. She has an appointment with them to come to her house tomorrow 04/19/19    Pertinent History  osteo arthritis, hx cellulitis, phlebitis and history of edema in BLE, Hx Acute kidney injury, CHF and DM II    Patient Stated Goals  I want to get this swelling down and heal my wound.    Currently in Pain?  Yes    Pain Score  7     Pain Location  Knee    Pain Orientation  Right;Left    Pain Descriptors / Indicators  Aching    Pain Type  Chronic pain    Pain Onset  More than a month ago    Pain Frequency  Intermittent    Aggravating Factors   movement rest                       [facility] Adult PT Treatment/Exercise - 04/18/19 0001      Manual Therapy   Manual Therapy  Edema management;Compression Bandaging    Manual therapy comments  Wound  clinic cared for wounds prior to physical therapy    Edema Management  Tubigrip was placed over L knee and thigh. Pt will continue to wear tubigrip over her L knee and thigh for edmea management in this area. Discussed the importance of getting some type of compression short or pants to decrease edema in this area.     Compression Bandaging  Pt wrapped this session using artiflex as base layer with 3 artiflex for shaping. 1 8 cm for roman sandal, 1 10 cm for ankle sole heel and 1 10 cm for spiral wrap. 12 cm from foot to below the knee. Ironing throughout to prevent wrinkles             PT Education - 04/18/19 1512    Education Details  Pt will continue to wear her tubigrip over her L Knee and thigh. She will use her pump 1 to 2x/day for the full time following education session. Pt will remove her wrap if she experiences any pain, tingling, numbness or throbbing that is not  relieved by exercise or removing the top wrap. She will remove her wrap by the 3rd day if she is unable to see physical therapy in order to maintain skin integrity. Pt will talk to [facility] and order her compression garment.    Person(s) Educated  Patient    Methods  Explanation    Comprehension  Verbalized understanding       PT Short Term Goals - 04/04/19 1515      PT SHORT TERM GOAL #1   Title  Pt will be independent with HEP    Baseline  Pt is working on her HEP occasionally at home.    Time  5    Period  Weeks    Status  On-going        PT Long Term Goals - 04/04/19 1515      PT LONG TERM GOAL #1   Title  Patient will have reduction of L lower leg limb girth by 3 cm    Baseline  Pt will be measuremed next session she has significantly reduced from her initial evaluation.    Time  5    Status  On-going    Target Date  05/23/19      PT LONG TERM GOAL #2   Title  Patient to be properly fitted with compression garment to wear on daily basis.    Baseline  Pt has a juxtalite that is too small and old that ht evelcro is no longer usable on. Trying to get her a [employer].    Time  5    Period  Weeks    Status  On-going    Target Date  05/23/19      PT LONG TERM GOAL #3   Title  Patient will be independent in self-care management principles including self-massage/bandaging and long term management plan for edema.    Baseline  Pt has difficulty with managing her lymphedema. Trying to ger her a pump to help with swelling.    Time  5    Period  Weeks    Status  On-going    Target Date  05/23/19            Plan - 04/18/19 1503    Clinical Impression Statement  Quick session today due to wound clinic is running behind and pt has transportation. Wound clinic wraped wounds prior to physical therapy this session. She continues with edema in her Bil lowers  legs. Her LLE continues to be significantly improved compared to RLE with relatively low levels of compression. Re-iterated  education to remove wraps by the 3rd day if she does not see physical therapy in order to decrease risk for skin integrity issues and to continue to wear the tubigrip over the thihg and knee due to pt has difficulty re-calling instructions. Pt was wrapped over her wound dressing using short stretch bandages from foot to below the knee with artiflex as a base layer and tubigri over the knee and thigh. Pt will benefit from continued POC at this time.    Personal Factors and Comorbidities  Age;Comorbidity 3+    Comorbidities  CHF, acute kidney injury, DM II    Rehab Potential  Good    PT Frequency  2x / week    PT Duration  12 weeks    PT Treatment/Interventions  Electrical Stimulation;Cryotherapy;Gait training;Stair training;Functional mobility training;Therapeutic activities;Therapeutic exercise;Balance training;Neuromuscular re-education;Patient/family education;Manual lymph drainage;Compression bandaging;Passive range of motion;Taping;Vasopneumatic Device    PT Next Visit Plan  ask if she has used pump, Assess wrap, give exercises, assess self MLD and possible work out pants?    PT Home Exercise Plan  ask about pump, provide exercises    Consulted and Agree with Plan of Care  Patient       Patient will benefit from skilled therapeutic intervention in order to improve the following deficits and impairments:  Difficulty walking, Increased edema  Visit Diagnosis: Lymphedema  Other abnormalities of gait and mobility     Problem List Patient Active Problem List   Diagnosis Date Noted  . Postnasal drip 11/16/2017  . Sensorineural hearing loss (SNHL), bilateral 11/16/2017  . Tinnitus of both ears 11/16/2017  . Obesity, Class II, BMI 35-39.9, isolated (see actual BMI) 11/08/2017  . CKD (chronic kidney disease), stage III 11/08/2017  . Anemia 11/07/2017  . Thrombocytosis ([place]) 11/07/2017  . Lymphedema 11/07/2017  . Routine general medical examination at a health care facility 05/10/2014  .  Paroxysmal SVT (supraventricular tachycardia) ([place]) 11/28/2013  . Physical deconditioning 11/23/2013  . Generalized weakness 11/17/2013  . Nausea 11/17/2013  . Multilevel spine pain 11/17/2013  . Aortic stenosis, mild 11/17/2013  . DM type 2 (diabetes mellitus, type 2) ([place]) 02/02/2013  . Arthritis 10/19/2012  . Mixed incontinence 10/19/2012  . GERD (gastroesophageal reflux disease) 04/20/2011  . HTN (hypertension) 01/20/2011  . Morbid obesity [facility])     Ander Purpura, PT 04/18/2019, 3:55 PM  [place], [place], [address] Phone: [phone]   Fax:  [phone]  Name: [name] [name] MRN: [id number] Date of Birth: January 12, 1946

## 2019-04-20 ENCOUNTER — Other Ambulatory Visit: Payer: Self-pay

## 2019-04-20 ENCOUNTER — Ambulatory Visit: Payer: PPO

## 2019-04-20 ENCOUNTER — Encounter (HOSPITAL_BASED_OUTPATIENT_CLINIC_OR_DEPARTMENT_OTHER): Payer: PPO | Admitting: Internal Medicine

## 2019-04-20 DIAGNOSIS — I890 Lymphedema, not elsewhere classified: Secondary | ICD-10-CM

## 2019-04-20 DIAGNOSIS — R2689 Other abnormalities of gait and mobility: Secondary | ICD-10-CM

## 2019-04-20 NOTE — Therapy (Signed)
[place], [place], [address] Phone: [phone]   Fax:  [phone]  Physical Therapy Treatment  Patient Details  Name: [name] [name] MRN: [id number] Date of Birth: [birth year] Referring Provider (PT): Darci Current   Encounter Date: 04/20/2019  PT End of Session - 04/20/19 1535    Visit Number  14    Number of Visits  25    Date for PT Re-Evaluation  05/09/19    PT Start Time  [id number]    PT Stop Time  1500    PT Time Calculation (min)  53 min    Activity Tolerance  Patient tolerated treatment well    Behavior During Therapy  [facility] for tasks assessed/performed       Past Medical History:  Diagnosis Date  . Acute kidney injury ([place])   . Aortic stenosis, mild 11/17/2013  . Arthritis    "both knees, spine, back, fingers" (11/29/2013)  . CHF (congestive heart failure) ([place])   . Edema   . GERD (gastroesophageal reflux disease)   . Heart murmur   . History of diastolic dysfunction    Echo [id number] with diastolic dysfunction  . HTN (hypertension)   . Morbid obesity ([place])   . OA (osteoarthritis)   . PAC (premature atrial contraction)    per prior Holter  . Palpitations   . Pneumonia    "as a child"  . PVC's (premature ventricular contractions)    per prior Holter  . SVT (supraventricular tachycardia) ([place])   . Tachycardia    noted at 07/28/11 visit. Started on beta blocker. Possible atrial flutter vs long PR tachycardia/AVNRT  . Type II diabetes mellitus ([place])     Past Surgical History:  Procedure Laterality Date  . CESAREAN SECTION  1985  . CESAREAN SECTION  1985  . SUPRAVENTRICULAR TACHYCARDIA ABLATION  11/29/2013  . SUPRAVENTRICULAR TACHYCARDIA ABLATION N/A 11/29/2013   Procedure: SUPRAVENTRICULAR TACHYCARDIA ABLATION;  Surgeon: Evans Lance, MD;  Location: [facility] CATH LAB;  Service: Cardiovascular;  Laterality: N/A;  . US ECHOCARDIOGRAPHY  07/25/2008   EF 55-60%  . VAGINAL DELIVERY       There were no vitals filed for this visit.  Subjective Assessment - 04/20/19 1537    Subjective  Pt states that she got her pump and tried to use it one time but she is concerned that she owes money on it and might need to send it back. (Pt needs to fill out information for financial assistance). Pt states that she talked to Kathlee Nations about the [employer] but said she does not have the funds at this time to purchase a compression wrap.    Pertinent History  osteo arthritis, hx cellulitis, phlebitis and history of edema in BLE, Hx Acute kidney injury, CHF and DM II                       [facility] Adult PT Treatment/Exercise - 04/20/19 0001      Manual Therapy   Manual Therapy  Edema management;Compression Bandaging    Manual therapy comments  pt lower leg was washed with warm water after bandages were removed using naset antimicrobial wound cleanser to loosen stuck on pieces of  ABD. Triamcinolone Acetone Cream and skin barrier were put on pt skin prior to placing ABD pads and everything was held in place with Kerlex.     Edema Management  Tubigrip wasplaced over pt knee and thigh     Compression Bandaging  Pt wrapped this session using tubigrip as base layer with 3 artiflex for shaping. 1 8 cm for roman sandal, 1 10 cm for ankle sole heel and 1 10 cm for spiral wrap. 12 cm from foot to below the knee. Ironing throughout to prevent wrinkles             PT Education - 04/20/19 1534    Education Details  Pt will continue to wear her tubigrip over her L knee and thigh. She will use her pump. She will remove her wrap on the 3rd day if she does not see physical therapy or if she experiences any pain, tingling, numbness or throbbing that is not relieved by exercise or removing the top wrap.    Person(s) Educated  Patient    Methods  Explanation    Comprehension  Verbalized understanding       PT Short Term Goals - 04/04/19 1515      PT SHORT TERM GOAL #1   Title  Pt will be  independent with HEP    Baseline  Pt is working on her HEP occasionally at home.    Time  5    Period  Weeks    Status  On-going        PT Long Term Goals - 04/04/19 1515      PT LONG TERM GOAL #1   Title  Patient will have reduction of L lower leg limb girth by 3 cm    Baseline  Pt will be measuremed next session she has significantly reduced from her initial evaluation.    Time  5    Status  On-going    Target Date  05/23/19      PT LONG TERM GOAL #2   Title  Patient to be properly fitted with compression garment to wear on daily basis.    Baseline  Pt has a juxtalite that is too small and old that ht evelcro is no longer usable on. Trying to get her a [employer].    Time  5    Period  Weeks    Status  On-going    Target Date  05/23/19      PT LONG TERM GOAL #3   Title  Patient will be independent in self-care management principles including self-massage/bandaging and long term management plan for edema.    Baseline  Pt has difficulty with managing her lymphedema. Trying to ger her a pump to help with swelling.    Time  5    Period  Weeks    Status  On-going    Target Date  05/23/19            Plan - 04/20/19 1504    Clinical Impression Statement  Pt is concerned about finances in regards to the pump and the compression wrap. She has recieved the pump but is concerned about a co-pay and will need to send it back if she owes money on it. Pt is also concerned about ordering her Francia Greaves due to finances. Pt needs compression to decrease edema in her LE in order to prevent formation of new wounds and re-occurance of old wounds. [employer] and [employer] have both been contacted to see if pt qualifies for financial assistance. Pt L lower leg was washed with soap and warm water. ABD pads were placed over open areas afer skin care was performed. Re-iterated education on removing the wraps the by the 3rd day if she does not see physical  therapy in order to decrease risk for  skin integrity issues and to continue to wear the tubigrip over the thigh and knee. Pt was wrapped over her wound dressing using short stretch bandgaes from foot to below the knee with tubigrip as a base layer and a new piece of tubigrip was provided to place over the knee and thigh due to the old piece is losing elasticity. Pt will benefit from continued POC at this time.    Personal Factors and Comorbidities  Age;Comorbidity 3+    Comorbidities  CHF, acute kidney injury, DM II    Rehab Potential  Good    PT Frequency  2x / week    PT Duration  12 weeks    PT Treatment/Interventions  Electrical Stimulation;Cryotherapy;Gait training;Stair training;Functional mobility training;Therapeutic activities;Therapeutic exercise;Balance training;Neuromuscular re-education;Patient/family education;Manual lymph drainage;Compression bandaging;Passive range of motion;Taping;Vasopneumatic Device    PT Next Visit Plan  ask if she has used pump, Assess wrap, give exercises, assess self MLD and possible work out pants?    PT Home Exercise Plan  ask about pump, provide exercises    Recommended Other Services  Pt is concerned about finances in ordering the [employer] and Agree with Plan of Care  Patient       Patient will benefit from skilled therapeutic intervention in order to improve the following deficits and impairments:  Difficulty walking, Increased edema  Visit Diagnosis: Lymphedema  Other abnormalities of gait and mobility     Problem List Patient Active Problem List   Diagnosis Date Noted  . Postnasal drip 11/16/2017  . Sensorineural hearing loss (SNHL), bilateral 11/16/2017  . Tinnitus of both ears 11/16/2017  . Obesity, Class II, BMI 35-39.9, isolated (see actual BMI) 11/08/2017  . CKD (chronic kidney disease), stage III 11/08/2017  . Anemia 11/07/2017  . Thrombocytosis ([place]) 11/07/2017  . Lymphedema 11/07/2017  . Routine general medical examination at a health care facility  05/10/2014  . Paroxysmal SVT (supraventricular tachycardia) ([place]) 11/28/2013  . Physical deconditioning 11/23/2013  . Generalized weakness 11/17/2013  . Nausea 11/17/2013  . Multilevel spine pain 11/17/2013  . Aortic stenosis, mild 11/17/2013  . DM type 2 (diabetes mellitus, type 2) ([place]) 02/02/2013  . Arthritis 10/19/2012  . Mixed incontinence 10/19/2012  . GERD (gastroesophageal reflux disease) 04/20/2011  . HTN (hypertension) 01/20/2011  . Morbid obesity [facility])     Ander Purpura, PT 04/20/2019, 3:38 PM  [place], [place], [address] Phone: [phone]   Fax:  [phone]  Name: [name] [name] MRN: [id number] Date of Birth: [birth year]

## 2019-04-21 NOTE — Progress Notes (Signed)
[name] [name] ([id number]) Visit Report for 04/18/2019 Arrival Information Details Patient Name: Date of Service: [name] [name], [name] [name] 04/18/2019 1:15 PM Medical Record [id number] Patient Account Number: [id number] Date of Birth/Sex: Treating RN: May 17, 1945 (74 y.o. Elam Dutch Primary Care Aliyha Fornes: [name] Other Clinician: Referring Takela Varden: Treating Afton Lavalle/Extender:Robson, Tenna Child, [place] Siren in Treatment: 14 Visit Information History Since Last Visit Walker Added or deleted any medications: No Patient Arrived: Any new allergies or adverse reactions: No Arrival Time: 14:16 Had a fall or experienced change in No Accompanied By: self activities of daily living that may affect Transfer Assistance: None risk of falls: Patient Identification Verified: Yes Signs or symptoms of abuse/neglect since last No Secondary Verification Process Completed: Yes visito Patient Requires Transmission-Based No Hospitalized since last visit: No Precautions: Implantable device outside of the clinic excluding No Patient Has Alerts: No cellular tissue based products placed in the center since last visit: Has Dressing in Place as Prescribed: Yes Has Compression in Place as Prescribed: Yes Pain Present Now: Yes Electronic Signature(s) Signed: 04/18/2019 6:12:59 PM By: Baruch Gouty RN, BSN Entered By: Baruch Gouty on 04/18/2019 14:16:57 -------------------------------------------------------------------------------- Compression Therapy Details Patient Name: Date of Service: [name] [name] 04/18/2019 1:15 PM Medical Record [id number] Patient Account Number: [id number] Date of Birth/Sex: Treating RN: 07-12-1945 (74 y.o. [name] Primary Care Edison Wollschlager: [name] Other Clinician: Referring Tahjay Binion: Treating Aniesha Haughn/Extender:Robson, Tenna Child, [place] Siren in Treatment: 14 Compression Therapy Performed for Wound  Non-Wound Location Assessment: Performed By: Clinician Carlene Coria, RN Compression Type: Four Layer Location: Lower Extremity, Right Post Procedure Diagnosis Same as Pre-procedure Electronic Signature(s) Signed: 04/18/2019 6:18:05 PM By: Carlene Coria RN Entered By: Carlene Coria on 04/18/2019 14:50:20 -------------------------------------------------------------------------------- Encounter Discharge Information Details Patient Name: Date of Service: [name] [name] 04/18/2019 1:15 PM Medical Record [id number] Patient Account Number: [id number] Date of Birth/Sex: Treating RN: [birth year] (74 y.o. Clearnce Sorrel Primary Care Almus Woodham: [name] Other Clinician: Referring Trajon Rosete: Treating Analyn Matusek/Extender:Robson, Tenna Child, [place] Siren in Treatment: 14 Encounter Discharge Information Items Discharge Condition: Stable Ambulatory Status: Walker Discharge Destination: Home Transportation: Other Accompanied By: self Schedule Follow-up Appointment: Yes Clinical Summary of Care: Patient Declined Electronic Signature(s) Signed: 04/21/2019 5:57:08 PM By: Kela Millin Entered By: Kela Millin on 04/18/2019 16:58:32 -------------------------------------------------------------------------------- Lower Extremity Assessment Details Patient Name: Date of Service: [name] [name] 04/18/2019 1:15 PM Medical Record [id number] Patient Account Number: [id number] Date of Birth/Sex: Treating RN: 1945-09-17 (74 y.o. Elam Dutch Primary Care Chavy Avera: [name] Other Clinician: Referring Mikaila Grunert: Treating Arletha Marschke/Extender:Robson, Tenna Child, [place] Siren in Treatment: 14 Edema Assessment Assessed: [Left: No] [Right: No] Edema: [Left: Ye] [Right: s] Calf Left: Right: Point of Measurement: 30 cm From Medial Instep 63.2 cm 52 cm Ankle Left: Right: Point of Measurement: 8 cm From Medial Instep 30.5 cm 28.6 cm Vascular  Assessment Pulses: Dorsalis Pedis Palpable: [Left:Yes] [Right:Yes] Electronic Signature(s) Signed: 04/18/2019 6:12:59 PM By: Baruch Gouty RN, BSN Entered By: Baruch Gouty on 04/18/2019 14:37:33 -------------------------------------------------------------------------------- Multi Wound Chart Details Patient Name: Date of Service: [name] [name] 04/18/2019 1:15 PM Medical Record [id number] Patient Account Number: [id number] Date of Birth/Sex: Treating RN: 07/23/1945 (74 y.o. F) Primary Care Azari Hasler: [name] Other Clinician: Referring Alianis Trimmer: Treating Habib Kise/Extender:Robson, Tenna Child, [place] Siren in Treatment: 14 Vital Signs Height(in): 71 Pulse(bpm): 10 Weight(lbs): 240 Blood Pressure(mmHg): 162/73 Body Mass Index(BMI): 33 Temperature(F): 97.6 Respiratory 18 Rate(breaths/min): Photos: [38:No Photos] [39:No Photos] [N/A:N/A] Wound Location: [38:Left Lower Leg - Posterior Left Lower Leg - Anterior] [N/A:N/A] Wounding Event: [38:Gradually Appeared] [39:Gradually Appeared] [N/A:N/A] Primary Etiology: [  38:Trauma, Other] [39:Lymphedema] [N/A:N/A] Comorbid History: [38:Cataracts, Lymphedema, Cataracts, Lymphedema, N/A Arrhythmia, Congestive Heart Failure, Hypertension, Heart Failure, Hypertension, Peripheral Venous Disease, Peripheral Venous Disease, Type II Diabetes, Osteoarthritis, Neuropathy,  Osteoarthritis, Neuropathy, Confinement Anxiety] [39:Arrhythmia, Congestive Type II Diabetes, Confinement Anxiety] Date Acquired: [38:04/08/2019] [39:04/11/2019] [N/A:N/A] Weeks of Treatment: [38:1] [39:1] [N/A:N/A] Wound Status: [38:Open] [39:Open] [N/A:N/A] Measurements L x W x D 0.3x0.3x0.1 [39:0.2x0.2x0.1] [N/A:N/A] (cm) Area (cm) : [38:0.071] [39:0.031] [N/A:N/A] Volume (cm) : [38:0.007] [39:0.003] [N/A:N/A] % Reduction in Area: [38:98.60%] [39:80.30%] [N/A:N/A] % Reduction in Volume: [38:98.60%] [39:81.30%] [N/A:N/A] Classification: [38:Full  Thickness Without Exposed Support Structures] [39:Partial Thickness] [N/A:N/A] Exudate Amount: [38:Small] [39:Medium] [N/A:N/A] Exudate Type: [38:Serosanguineous] [39:Serous] [N/A:N/A] Exudate Color: [38:red, brown] [39:amber] [N/A:N/A] Wound Margin: [38:Distinct, outline attached Distinct, outline attached] [N/A:N/A] Granulation Amount: [38:Large (67-100%)] [39:Large (67-100%)] [N/A:N/A] Granulation Quality: [38:Red, Pink] [39:Pink] [N/A:N/A] Necrotic Amount: [38:None Present (0%)] [39:None Present (0%)] [N/A:N/A] Exposed Structures: [38:Fat Layer (Subcutaneous Fascia: No Tissue) Exposed: Yes Fascia: No Tendon: No Muscle: No Joint: No Bone: No Medium (34-66%)] [39:Fat Layer (Subcutaneous Tissue) Exposed: No Tendon: No Muscle: No Joint: No Bone: No Limited to Skin Breakdown Large  (67-100%)] [N/A:N/A N/A] Treatment Notes Electronic Signature(s) Signed: 04/18/2019 6:16:53 PM By: Linton Ham MD Entered By: Linton Ham on 04/18/2019 14:53:39 -------------------------------------------------------------------------------- Multi-Disciplinary Care Plan Details Patient Name: Date of Service: SHAVAUN, BRIENZA 04/18/2019 1:15 PM Medical Record [id number] Patient Account Number: [id number] Date of Birth/Sex: Treating RN: Aug 30, 1945 (74 y.o. [name] Primary Care Radha Coggins: [name] Other Clinician: Referring Malesha Suliman: Treating Erin Uecker/Extender:Robson, Tenna Child, [place] Siren in Treatment: 14 Active Inactive Wound/Skin Impairment Nursing Diagnoses: Knowledge deficit related to ulceration/compromised skin integrity Goals: Patient/caregiver will verbalize understanding of skin care regimen Date Initiated: 01/10/2019 Target Resolution Date: 05/19/2019 Goal Status: Active Ulcer/skin breakdown will have a volume reduction of 30% by week 4 Target Resolution Date Initiated: 01/10/2019 Date Inactivated: 02/14/2019 Date: 02/10/2019 Unmet Goal Status:  Unmet Reason: comorbities/lymphedema Ulcer/skin breakdown will have a volume reduction of 50% by week 8 Target Resolution Date Initiated: 02/14/2019 Date Inactivated: 03/21/2019 Date: 03/17/2019 Unmet Goal Status: Unmet Reason: comorbities/lymphedema Ulcer/skin breakdown will have a volume reduction of 80% by week 12 Date Initiated: 03/21/2019 Target Resolution Date: 04/21/2019 Goal Status: Active Interventions: Assess patient/caregiver ability to obtain necessary supplies Assess patient/caregiver ability to perform ulcer/skin care regimen upon admission and as needed Assess ulceration(s) every visit Notes: Electronic Signature(s) Signed: 04/18/2019 6:18:05 PM By: Carlene Coria RN Entered By: Carlene Coria on 04/18/2019 14:23:05 -------------------------------------------------------------------------------- Pain Assessment Details Patient Name: Date of Service: JAYME, MCIRVIN 04/18/2019 1:15 PM Medical Record [id number] Patient Account Number: [id number] Date of Birth/Sex: Treating RN: 1945/12/19 (74 y.o. Elam Dutch Primary Care Dameshia Seybold: [name] Other Clinician: Referring Albaro Deviney: Treating Alezandra Egli/Extender:Robson, Tenna Child, [place] Siren in Treatment: 14 Active Problems Location of Pain Severity and Description of Pain Patient Has Paino Yes Site Locations Pain Location: Generalized Pain, Pain in Ulcers With Dressing Change: No Duration of the Pain. Constant / Intermittento Intermittent Rate the pain. Current Pain Level: 8 Least Pain Level: 0 Character of Pain Describe the Pain: Sharp, Shooting Pain Management and Medication Current Pain Management: Rest: Yes Is the Current Pain Management Adequate: Adequate How does your wound impact your activities of daily livingo Sleep: No Bathing: No Appetite: No Relationship With Others: No Bladder Continence: No Emotions: No Bowel Continence: No Work: No Toileting: No Drive:  No Dressing: No Hobbies: Yes Electronic Signature(s) Signed: 04/18/2019 6:12:59 PM By: Baruch Gouty RN, BSN Entered By: Baruch Gouty on 04/18/2019 14:19:18 -------------------------------------------------------------------------------- Patient/Caregiver  Education Details Patient Name: Date of Service: ELERI, TINER 1/12/2021andnbsp1:15 PM Medical Record [id number] Patient Account Number: [id number] Date of Birth/Gender: Treating RN: 03/05/1946 (74 y.o. [name] Primary Care Physician: [name] Other Clinician: Referring Physician: Treating Physician/Extender:Robson, Tenna Child, [place] Siren in Treatment: 14 Education Assessment Education Provided To: Patient Education Topics Provided Wound/Skin Impairment: Methods: Explain/Verbal Responses: State content correctly Electronic Signature(s) Signed: 04/18/2019 6:18:05 PM By: Carlene Coria RN Entered By: Carlene Coria on 04/18/2019 14:23:35 -------------------------------------------------------------------------------- Wound Assessment Details Patient Name: Date of Service: CAROLEA, BROCHU 04/18/2019 1:15 PM Medical Record [id number] Patient Account Number: [id number] Date of Birth/Sex: Treating RN: 09-11-1945 (74 y.o. Elam Dutch Primary Care Yarimar Lavis: [name] Other Clinician: Referring Delfino Friesen: Treating Anes Rigel/Extender:Robson, Tenna Child, [place] Siren in Treatment: 14 Wound Status Wound Number: [id number] Primary Trauma, Other Etiology: Wound Location: Left Lower Leg - Posterior Wound Open Wounding Event: Gradually Appeared Status: Date Acquired: 04/08/2019 Comorbid Cataracts, Lymphedema, Arrhythmia, Weeks Of Treatment: 1 History: Congestive Heart Failure, Hypertension, Clustered Wound: No Peripheral Venous Disease, Type II Diabetes, Osteoarthritis, Neuropathy, Confinement Anxiety Photos Wound Measurements Length: (cm) 0.3 % Reduct Width: (cm)  0.3 % Reduct Depth: (cm) 0.1 Epitheli Area: (cm) 0.071 Tunneli Volume: (cm) 0.007 Undermi Wound Description Classification: Full Thickness Without Exposed Support Foul Od Structures Slough/ Wound Distinct, outline attached Margin: Exudate Small Amount: Exudate Serosanguineous Type: Exudate red, brown Color: Wound Bed Granulation Amount: Large (67-100%) Granulation Quality: Red, Pink Fascia Necrotic Amount: None Present (0%) Fat Lay Tendon Muscle Joint E Bone Ex or After Cleansing: No Fibrino No Exposed Structure Exposed: No er (Subcutaneous Tissue) Exposed: Yes Exposed: No Exposed: No xposed: No posed: No ion in Area: 98.6% ion in Volume: 98.6% alization: Medium (34-66%) ng: No ning: No Electronic Signature(s) Signed: 04/20/2019 3:33:56 PM By: Mikeal Hawthorne EMT/HBOT Signed: 04/20/2019 6:38:38 PM By: Baruch Gouty RN, BSN Previous Signature: 04/18/2019 6:12:59 PM Version By: Baruch Gouty RN, BSN Entered By: Mikeal Hawthorne on 04/20/2019 14:05:09 -------------------------------------------------------------------------------- Wound Assessment Details Patient Name: Date of Service: CAILIN, FREEZE 04/18/2019 1:15 PM Medical Record [id number] Patient Account Number: [id number] Date of Birth/Sex: Treating RN: Aug 10, 1945 (74 y.o. Elam Dutch Primary Care Azael Ragain: [name] Other Clinician: Referring Avriel Kandel: Treating Alfreddie Consalvo/Extender:Robson, Tenna Child, [place] Siren in Treatment: 14 Wound Status Wound Number: 39 Primary Lymphedema Etiology: Wound Location: Left Lower Leg - Anterior Wound Open Wounding Event: Gradually Appeared Status: Date Acquired: 04/11/2019 Comorbid Cataracts, Lymphedema, Arrhythmia, Weeks Of Treatment: 1 History: Congestive Heart Failure, Hypertension, Clustered Wound: No Peripheral Venous Disease, Type II Diabetes, Osteoarthritis, Neuropathy, Confinement Anxiety Photos Wound  Measurements Length: (cm) 0.2 Width: (cm) 0.2 Depth: (cm) 0.1 Area: (cm) 0.031 Volume: (cm) 0.003 Wound Description Classification: Partial Thickness Wound Margin: Distinct, outline attached Exudate Amount: Medium Exudate Type: Serous Exudate Color: amber Wound Bed Granulation Amount: Large (67-100%) Granulation Quality: Pink Necrotic Amount: None Present (0%) After Cleansing: No brino Yes Exposed Structure posed: No (Subcutaneous Tissue) Exposed: No posed: No posed: No osed: No sed: No o Skin Breakdown % Reduction in Area: 80.3% % Reduction in Volume: 81.3% Epithelialization: Large (67-100%) Tunneling: No Undermining: No Foul Odor Slough/Fi Fascia Ex Fat Layer Tendon Ex Muscle Ex Joint Exp Bone Expo Limited t Electronic Signature(s) Signed: 04/20/2019 3:33:56 PM By: Mikeal Hawthorne EMT/HBOT Signed: 04/20/2019 6:38:38 PM By: Baruch Gouty RN, BSN Previous Signature: 04/18/2019 6:12:59 PM Version By: Baruch Gouty RN, BSN Entered By: Mikeal Hawthorne on 04/20/2019 14:05:43 -------------------------------------------------------------------------------- Vitals Details Patient Name: Date of Service: Mckone, Alisandra 04/18/2019 1:15 PM Medical Record [id number] Patient  Account Number: [id number] Date of Birth/Sex: Treating RN: Jun 07, 1945 (74 y.o. Elam Dutch Primary Care Rilda Bulls: [name] Other Clinician: Referring Miqueas Whilden: Treating Taletha Twiford/Extender:Robson, Tenna Child, [place] Siren in Treatment: 14 Vital Signs Time Taken: 14:16 Temperature (F): 97.6 Height (in): 71 Pulse (bpm): 59 Source: Stated Respiratory Rate (breaths/min): 18 Weight (lbs): 240 Blood Pressure (mmHg): 162/73 Source: Stated Reference Range: 80 - 120 mg / dl Body Mass Index (BMI): 33.5 Electronic Signature(s) Signed: 04/18/2019 6:12:59 PM By: Baruch Gouty RN, BSN Entered By: Baruch Gouty on 04/18/2019 14:18:33

## 2019-04-25 ENCOUNTER — Ambulatory Visit: Payer: PPO

## 2019-04-25 ENCOUNTER — Encounter (HOSPITAL_BASED_OUTPATIENT_CLINIC_OR_DEPARTMENT_OTHER): Payer: PPO | Attending: Internal Medicine | Admitting: Internal Medicine

## 2019-04-25 ENCOUNTER — Other Ambulatory Visit: Payer: Self-pay

## 2019-04-25 DIAGNOSIS — L97221 Non-pressure chronic ulcer of left calf limited to breakdown of skin: Secondary | ICD-10-CM | POA: Diagnosis not present

## 2019-04-25 DIAGNOSIS — L97229 Non-pressure chronic ulcer of left calf with unspecified severity: Secondary | ICD-10-CM | POA: Diagnosis not present

## 2019-04-25 DIAGNOSIS — I890 Lymphedema, not elsewhere classified: Secondary | ICD-10-CM | POA: Diagnosis not present

## 2019-04-25 DIAGNOSIS — L97821 Non-pressure chronic ulcer of other part of left lower leg limited to breakdown of skin: Secondary | ICD-10-CM | POA: Diagnosis not present

## 2019-04-25 DIAGNOSIS — L97219 Non-pressure chronic ulcer of right calf with unspecified severity: Secondary | ICD-10-CM | POA: Diagnosis not present

## 2019-04-25 DIAGNOSIS — R2689 Other abnormalities of gait and mobility: Secondary | ICD-10-CM

## 2019-04-25 NOTE — Therapy (Signed)
[place], [place], [address] Phone: [phone]   Fax:  [phone]  Physical Therapy Treatment  Patient Details  Name: [name] [name] MRN: [id number] Date of Birth: 1945/12/29 Referring Provider (PT): Darci Current   Encounter Date: 04/25/2019  PT End of Session - 04/25/19 1458    Visit Number  15    Number of Visits  25    Date for PT Re-Evaluation  05/09/19    PT Start Time  [id number]    PT Stop Time  1445    PT Time Calculation (min)  30 min    Activity Tolerance  Patient tolerated treatment well    Behavior During Therapy  [facility] for tasks assessed/performed       Past Medical History:  Diagnosis Date  . Acute kidney injury ([place])   . Aortic stenosis, mild 11/17/2013  . Arthritis    "both knees, spine, back, fingers" (11/29/2013)  . CHF (congestive heart failure) ([place])   . Edema   . GERD (gastroesophageal reflux disease)   . Heart murmur   . History of diastolic dysfunction    Echo [id number] with diastolic dysfunction  . HTN (hypertension)   . Morbid obesity ([place])   . OA (osteoarthritis)   . PAC (premature atrial contraction)    per prior Holter  . Palpitations   . Pneumonia    "as a child"  . PVC's (premature ventricular contractions)    per prior Holter  . SVT (supraventricular tachycardia) ([place])   . Tachycardia    noted at 07/28/11 visit. Started on beta blocker. Possible atrial flutter vs long PR tachycardia/AVNRT  . Type II diabetes mellitus ([place])     Past Surgical History:  Procedure Laterality Date  . CESAREAN SECTION  1985  . CESAREAN SECTION  1985  . SUPRAVENTRICULAR TACHYCARDIA ABLATION  11/29/2013  . SUPRAVENTRICULAR TACHYCARDIA ABLATION N/A 11/29/2013   Procedure: SUPRAVENTRICULAR TACHYCARDIA ABLATION;  Surgeon: Evans Lance, MD;  Location: [facility] CATH LAB;  Service: Cardiovascular;  Laterality: N/A;  . US ECHOCARDIOGRAPHY  07/25/2008   EF 55-60%  . VAGINAL DELIVERY       There were no vitals filed for this visit.  Subjective Assessment - 04/25/19 1453    Subjective  Pt reports that she tried her pump 2x over the weekend. She states that she had some tightness in her leg and left her wraps on during the pump which she was advised to do by her MD and by this physical therapist.    Pertinent History  osteo arthritis, hx cellulitis, phlebitis and history of edema in BLE, Hx Acute kidney injury, CHF and DM II    Patient Stated Goals  I want to get this swelling down and heal my wound.    Currently in Pain?  Yes    Pain Score  8     Pain Location  Knee    Pain Orientation  Right;Left    Pain Descriptors / Indicators  Aching    Pain Type  Chronic pain    Pain Onset  More than a month ago    Pain Frequency  Intermittent    Aggravating Factors   movement    Pain Relieving Factors  rest                       [facility] Adult PT Treatment/Exercise - 04/25/19 0001      Manual Therapy   Manual Therapy  Edema management;Compression Bandaging  Manual therapy comments  Pt lower leg wound care was performed by the wound clinic prior to this physical therapist seeing her.     Edema Management  Pt advised to only wear ont piece of tubigrip over the thigh she was doubling it up and this seems to have decreased fluid flow out of the L thigh causing slight increase in swelling just above the knee on the medial side.     Compression Bandaging  Pt wrapped this session using tubigrip as base layer with 3 artiflex for shaping. 2 8 cm for roman sandal and ankle/sole/heel, 4 10 cm for spiral wrap and 1 12 cm from for thigh. Ironing throughout to prevent wrinkles no significant pull this session.              PT Education - 04/25/19 1456    Education Details  Pt will pump every day while wearing her compression wrap. She will try to keep her wrap up and if it bunches at the knee she will remove it. She will not wear double tubigrip over her knee and thigh  becuase instead of helping she understands this increases fluid build up in this area. She will remove her wrap on the 3rd day if she does not see physical therapy or if she experiences any pain, tingling, numbness or throbbing that is not relieved by exercise or removing the top wrap.    Person(s) Educated  Patient    Methods  Explanation    Comprehension  Verbalized understanding       PT Short Term Goals - 04/04/19 1515      PT SHORT TERM GOAL #1   Title  Pt will be independent with HEP    Baseline  Pt is working on her HEP occasionally at home.    Time  5    Period  Weeks    Status  On-going        PT Long Term Goals - 04/04/19 1515      PT LONG TERM GOAL #1   Title  Patient will have reduction of L lower leg limb girth by 3 cm    Baseline  Pt will be measuremed next session she has significantly reduced from her initial evaluation.    Time  5    Status  On-going    Target Date  05/23/19      PT LONG TERM GOAL #2   Title  Patient to be properly fitted with compression garment to wear on daily basis.    Baseline  Pt has a juxtalite that is too small and old that ht evelcro is no longer usable on. Trying to get her a [employer].    Time  5    Period  Weeks    Status  On-going    Target Date  05/23/19      PT LONG TERM GOAL #3   Title  Patient will be independent in self-care management principles including self-massage/bandaging and long term management plan for edema.    Baseline  Pt has difficulty with managing her lymphedema. Trying to ger her a pump to help with swelling.    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 She continues with an exacerbation that could potentially be due to pt was doubling up the tubigrip over her knee/thigh.  She was educated to stop this and to only wear wrap that was placed over the knee/thigh today. She was encouraged to pump every day and wear the compression pants that she purchased a while ago that she has not been wearing. Pt wounds were cared for by wound clinic prior to physical therapy. Pt was wrapped this session with layered compression wrap over the knee due to she is beginning to form a small pannus at the medial distal thigh. This was discussed with patient and emphasized education on how to decrease the risk of this forming into a larger issue to her mobility. Pt will benefit from continued Physical therapy at this time until she is able to manage at home with pump and compression garments. This is taking longer than expected due to multiple factors including varied co-morbidities and difficulty with compliance especially when it involves removing wraps earlier than the next physical therapy appointment. Continue with current POC.    Personal Factors and Comorbidities  Age;Comorbidity 3+    Comorbidities  CHF, acute kidney injury, DM II    PT Frequency  2x / week    PT Duration  12 weeks    PT Treatment/Interventions  Electrical Stimulation;Cryotherapy;Gait training;Stair training;Functional mobility training;Therapeutic activities;Therapeutic exercise;Balance training;Neuromuscular re-education;Patient/family education;Manual lymph drainage;Compression bandaging;Passive range of motion;Taping;Vasopneumatic Device    PT Next Visit Plan  ask if she has used pump, Assess wrap, give exercises, assess self MLD and possible work out pants?    PT Home Exercise Plan  ask about pump, provide exercises    Recommended Other Services  compression at the lower legs, knees and thighs    Consulted and Agree with Plan of Care  Patient       Patient will benefit from skilled therapeutic intervention in order to improve the following deficits and impairments:     Visit Diagnosis: Lymphedema  Other  abnormalities of gait and mobility     Problem List Patient Active Problem List   Diagnosis Date Noted  . Postnasal drip 11/16/2017  . Sensorineural hearing loss (SNHL), bilateral 11/16/2017  . Tinnitus of both ears 11/16/2017  . Obesity, Class II, BMI 35-39.9, isolated (see actual BMI) 11/08/2017  . CKD (chronic kidney disease), stage III 11/08/2017  . Anemia 11/07/2017  . Thrombocytosis ([place]) 11/07/2017  . Lymphedema 11/07/2017  . Routine general medical examination at a health care facility 05/10/2014  . Paroxysmal SVT (supraventricular tachycardia) ([place]) 11/28/2013  . Physical deconditioning 11/23/2013  . Generalized weakness 11/17/2013  . Nausea 11/17/2013  . Multilevel spine pain 11/17/2013  . Aortic stenosis, mild 11/17/2013  . DM type 2 (diabetes mellitus, type 2) ([place]) 02/02/2013  . Arthritis 10/19/2012  . Mixed incontinence 10/19/2012  . GERD (gastroesophageal reflux disease) 04/20/2011  . HTN (hypertension) 01/20/2011  . Morbid obesity [facility])     Ander Purpura , PT 04/25/2019, 3:03 PM  [place], [place], [address] Phone: [phone]   Fax:  [phone]  Name: [name] [name] MRN: [id number] Date of Birth: [birth year]

## 2019-04-26 NOTE — Progress Notes (Addendum)
[name], [name] ([id number]) Visit Report for 04/25/2019 Arrival Information Details Patient Name: Date of Service: [name], [name] 04/25/2019 1:15 PM Medical Record [id number] Patient Account Number: [id number] Date of Birth/Sex: Treating RN: [birth year] (74 y.o. [name] [name] Primary Care Brielle Moro: [name] [name] Other Clinician: Referring [name] [name]: Treating [name] [name]/Extender:[name] [name], [name] [name], [name] [name] [name]: 15 Visit Information History Since Last Visit Walker Added or deleted any medications: No Patient Arrived: Any new allergies or adverse reactions: No Arrival Time: 13:15 Had a fall or experienced change in No Accompanied By: self activities of daily living that may affect Transfer Assistance: None risk of falls: Patient Identification Verified: Yes Signs or symptoms of abuse/neglect since last No Secondary Verification Process Completed: Yes visito Patient Requires Transmission-Based No Hospitalized since last visit: No Precautions: Implantable device outside of the clinic excluding No Patient Has Alerts: No cellular tissue based products placed in the center since last visit: Has Dressing in Place as Prescribed: Yes Has Compression in Place as Prescribed: Yes Pain Present Now: Yes Electronic Signature(s) Signed: 04/25/2019 5:39:51 PM By: [name] [name] Entered By: [name] [name] on 04/25/2019 13:22:20 -------------------------------------------------------------------------------- Compression Therapy Details Patient Name: Date of Service: [name], [name] 04/25/2019 1:15 PM Medical Record [id number] Patient Account Number: [id number] Date of Birth/Sex: Treating RN: 06/04/1945 (74 y.o. [name] [name] Primary Care Zakyla Tonche: [name] [name] Other Clinician: Referring Aser Nylund: Treating [name] [name]/Extender:[name] [name], [name] [name], [name] [name] [name]: 15 Compression Therapy Performed for Wound Wound #40  Right,Posterior Lower Leg Assessment: Performed By: Clinician [name] Coria, RN Compression Type: Four Layer Post Procedure Diagnosis Same as Pre-procedure Electronic Signature(s) Signed: 04/26/2019 5:42:11 PM By: [name] Coria RN Entered By: [name] [name] on 04/25/2019 13:54:34 -------------------------------------------------------------------------------- Encounter Discharge Information Details Patient Name: Date of Service: [name], [name] 04/25/2019 1:15 PM Medical Record [id number] Patient Account Number: [id number] Date of Birth/Sex: Treating RN: 30-Apr-1945 (74 y.o. [name] [name] Primary Care Laysa Kimmey: [name] [name] Other Clinician: Referring Zarin Knupp: Treating [name] [name]/Extender:[name] [name], [name] [name], [name] [name] [name]: 15 Encounter Discharge Information Items Discharge Condition: Stable Ambulatory Status: Walker Discharge Destination: Home Transportation: Other Accompanied By: self Schedule Follow-up Appointment: Yes Clinical Summary of Care: Patient Declined Electronic Signature(s) Signed: 04/25/2019 5:45:51 PM By: [name] [name] Entered By: [name] [name] on 04/25/2019 14:14:58 -------------------------------------------------------------------------------- Lower Extremity Assessment Details Patient Name: Date of Service: [name], [name] 04/25/2019 1:15 PM Medical Record [id number] Patient Account Number: [id number] Date of Birth/Sex: Treating RN: 1945-05-05 (74 y.o. [name] [name], [name] [name] Primary Care Kyona Chauncey: [name] [name] Other Clinician: Referring Fields Oros: Treating [name] [name]/Extender:[name] [name], [name] [name], [name] [name] [name]: 15 Edema Assessment Assessed: [Left: Yes] [Right: Yes] Edema: [Left: Yes] [Right: Yes] Calf Left: Right: Point of Measurement: 30 cm From Medial Instep 56.5 cm 50.5 cm Ankle Left: Right: Point of Measurement: 8 cm From Medial Instep 28 cm 29 cm Electronic Signature(s) Signed:  04/25/2019 5:39:51 PM By: [name] [name] Entered By: [name] [name] on 04/25/2019 13:35:37 -------------------------------------------------------------------------------- Multi Wound Chart Details Patient Name: Date of Service: [name], [name] 04/25/2019 1:15 PM Medical Record [id number] Patient Account Number: [id number] Date of Birth/Sex: Treating RN: 1946-01-08 (74 y.o. F) Primary Care [name] [name]: [name] [name] Other Clinician: Referring [name] [name]: Treating [name] [name]/Extender:[name] [name], [name] [name], [name] [name] [name]: 15 Vital Signs Height(in): 71 Pulse(bpm): 62 Weight(lbs): 240 Blood Pressure(mmHg): 149/66 Body Mass Index(BMI): 33 Temperature(F): 97.8 Respiratory 20 Rate(breaths/min): Photos: [38:No Photos] [39:No Photos] [40:No Photos] Wound Location: [38:Left, Posterior Lower Leg] [39:Left Lower Leg - Anterior] [40:Right Lower Leg - Posterior] Wounding Event: [38:Gradually Appeared] [39:Gradually Appeared] [40:Gradually Appeared] Primary Etiology: [38:Trauma, Other] [39:Lymphedema] [40:Lymphedema] Comorbid History: [38:N/A] [39:Cataracts,  Lymphedema, Cataracts, Lymphedema, Arrhythmia, Congestive Heart Failure, Hypertension, Heart Failure, Hypertension, Peripheral Venous Disease, Peripheral Venous Disease, Type II Diabetes, Osteoarthritis,  Neuropathy, Osteoarthritis, Neuropathy, Confinement Anxiety] [40:Arrhythmia, Congestive Type II Diabetes, Confinement Anxiety] Date Acquired: [38:04/08/2019] [39:04/11/2019] [40:04/25/2019] Weeks of [name]: [38:2] [39:2] [40:0] Wound Status: [38:Healed - Epithelialized] [39:Open] [40:Open] Measurements L x W x D [38:0x0x0] [39:0.2x0.2x0.1] [40:0.3x0.3x0.1] (cm) Area (cm) : [38:0] [39:0.031] [40:0.071] Volume (cm) : [38:0] [39:0.003] [40:0.007] % Reduction in Area: [38:100.00%] [39:80.30%] [40:N/A] % Reduction in Volume: [38:100.00%] [39:81.30%] [40:N/A] Classification: [38:Full Thickness Without Exposed Support  Structures] [39:Partial Thickness] [40:Full Thickness Without Exposed Support Structures] Exudate Amount: [38:N/A] [39:Medium] [40:Medium] Exudate Type: [38:N/A] [39:Serous] [40:Serosanguineous] Exudate Color: [38:N/A] [39:amber] [40:red, brown] Wound Margin: [38:N/A] [39:Distinct, outline attached] [40:Distinct, outline attached] Granulation Amount: [38:N/A] [39:Large (67-100%)] [40:Large (67-100%)] Granulation Quality: [38:N/A] [39:Pink] [40:Red, Pink] Necrotic Amount: [38:N/A] [39:None Present (0%)] [40:None Present (0%)] Epithelialization: [38:N/A N/A] [39:Large (67-100%) N/A] [40:Small (1-33%) Compression Therapy] [name] Notes Electronic Signature(s) Signed: 04/25/2019 5:52:07 PM By: Linton Ham MD Entered By: Linton Ham on 04/25/2019 13:58:58 -------------------------------------------------------------------------------- Multi-Disciplinary Care Plan Details Patient Name: Date of Service: [name], [name] 04/25/2019 1:15 PM Medical Record [id number] Patient Account Number: [id number] Date of Birth/Sex: Treating RN: Oct 18, 1945 (74 y.o. [name] [name] Primary Care Tyerra Loretto: [name] [name] Other Clinician: Referring Alleigh Mollica: Treating Charmin Aguiniga/Extender:[name] [name], [name] [name], [name] [name] [name]: 15 Active Inactive Wound/Skin Impairment Nursing Diagnoses: Knowledge deficit related to ulceration/compromised skin integrity Goals: Patient/caregiver will verbalize understanding of skin care regimen Date Initiated: 01/10/2019 Target Resolution Date: 05/19/2019 Goal Status: Active Ulcer/skin breakdown will have a volume reduction of 30% by week 4 Target Resolution Date Initiated: 01/10/2019 Date Inactivated: 02/14/2019 Date: 02/10/2019 Unmet Goal Status: Unmet Reason: comorbities/lymphedema Ulcer/skin breakdown will have a volume reduction of 50% by week 8 Target Resolution Date Initiated: 02/14/2019 Date Inactivated: 03/21/2019 Date:  03/17/2019 Unmet Goal Status: Unmet Reason: comorbities/lymphedema Ulcer/skin breakdown will have a volume reduction of 80% by week 12 Target Resolution Date Initiated: 03/21/2019 Date Inactivated: 04/25/2019 Date: 04/21/2019 Unmet Goal Status: Unmet Goal Status: Unmet Reason: comorbities/lymphedema Ulcer/skin breakdown will heal within 14 weeks Date Initiated: 04/25/2019 Target Resolution Date: 05/26/2019 Goal Status: Active Interventions: Assess patient/caregiver ability to obtain necessary supplies Assess patient/caregiver ability to perform ulcer/skin care regimen upon admission and as needed Assess ulceration(s) every visit Notes: Electronic Signature(s) Signed: 04/26/2019 5:42:11 PM By: [name] Coria RN Entered By: [name] [name] on 04/25/2019 13:55:54 -------------------------------------------------------------------------------- Non-Wound Condition Assessment Details Patient Name: Date of Service: [name], [name] 04/25/2019 1:15 PM Medical Record [id number] Patient Account Number: [id number] Date of Birth/Sex: Treating RN: [birth year] (74 y.o. [name] [name] Primary Care Janvi Ammar: [name] [name] Other Clinician: Referring Tammie Yanda: Treating Quaneshia Wareing/Extender:[name] [name], [name] [name], [name] [name] [name]: 15 Non-Wound Condition: Condition: Lymphedema Location: Leg Side: Right Notes cobblestone appearance with hemosiderin staining. Electronic Signature(s) Signed: 04/25/2019 5:39:51 PM By: [name] [name] Entered By: [name] [name] on 04/25/2019 13:40:44 -------------------------------------------------------------------------------- Non-Wound Condition Assessment Details Patient Name: Date of Service: [name], [name] 04/25/2019 1:15 PM Medical Record [id number] Patient Account Number: [id number] Date of Birth/Sex: Treating RN: 30-Mar-1946 (74 y.o. [name] [name] Primary Care Gracyn Santillanes: [name] [name] Other Clinician: Referring Caedin Mogan:  Treating Merritt Kibby/Extender:[name] [name], [name] [name], [name] [name] [name]: 15 Non-Wound Condition: Condition: Lymphedema Location: Leg Side: Left Notes: weeping left lateral lower leg. Notes cobblestone appearance with hemosiderin staining. Electronic Signature(s) Signed: 04/25/2019 5:39:51 PM By: [name] [name] Entered By: [name] [name] on 04/25/2019 13:41:04 -------------------------------------------------------------------------------- Pain Assessment Details Patient Name: Date of Service: [name], [name] 04/25/2019 1:15 PM Medical Record [id number] Patient Account Number:  [id number] Date of Birth/Sex: Treating RN: March 24, 1946 (74 y.o. [name] [name] Primary Care Suriyah Vergara: [name] [name] Other Clinician: Referring Maghen Group: Treating Malayia Spizzirri/Extender:[name] [name], [name] [name], [name] [name] [name]: 15 Active Problems Location of Pain Severity and Description of Pain Patient Has Paino Yes Site Locations Pain Location: Generalized Pain, Pain in Ulcers Rate the pain. Current Pain Level: 8 Worst Pain Level: 10 Least Pain Level: 0 Tolerable Pain Level: 8 Character of Pain Describe the Pain: Aching Pain Management and Medication Current Pain Management: Medication: Yes Cold Application: No Rest: Yes Massage: No Activity: No T.E.N.S.: No Heat Application: No Leg drop or elevation: No Is the Current Pain Management Adequate: Inadequate How does your wound impact your activities of daily livingo Sleep: No Bathing: No Appetite: No Relationship With Others: No Bladder Continence: No Emotions: No Bowel Continence: No Work: No Toileting: No Drive: No Dressing: No Hobbies: No Electronic Signature(s) Signed: 04/25/2019 5:39:51 PM By: [name] [name] Entered By: [name] [name] on 04/25/2019 13:22:41 -------------------------------------------------------------------------------- Patient/Caregiver Education Details Patient Name: Date of  Service: [name], [name] 1/19/2021andnbsp1:15 PM Medical Record [id number] Patient Account Number: [id number] Date of Birth/Gender: Treating RN: 08-11-1945 (74 y.o. [name] [name] Primary Care Physician: [name] [name] Other Clinician: Referring Physician: Treating Physician/Extender:[name] [name], [name] [name], [name] [name] [name]: 15 Education Assessment Education Provided To: Patient Education Topics Provided Wound/Skin Impairment: Methods: Explain/Verbal Responses: State content correctly Electronic Signature(s) Signed: 04/26/2019 5:42:11 PM By: [name] Coria RN Entered By: [name] [name] on 04/25/2019 13:16:14 -------------------------------------------------------------------------------- Wound Assessment Details Patient Name: Date of Service: [name], [name] 04/25/2019 1:15 PM Medical Record [id number] Patient Account Number: [id number] Date of Birth/Sex: Treating RN: 24-Mar-1946 (74 y.o. [name] [name], [name] [name] Primary Care Yuvan Medinger: [name] [name] Other Clinician: Referring Melek Pownall: Treating Deforest Maiden/Extender:[name] [name], [name] [name], [name] [name] [name]: 15 Wound Status Wound Number: [id number] Primary Trauma, Other Etiology: Wound Location: Left Lower Leg - Posterior Wound Healed - Epithelialized Wounding Event: Gradually Appeared Status: Date Acquired: 04/08/2019 Comorbid Cataracts, Lymphedema, Arrhythmia, Weeks Of [name]: 2 History: Congestive Heart Failure, Hypertension, Clustered Wound: No Peripheral Venous Disease, Type II Diabetes, Osteoarthritis, Neuropathy, Confinement Anxiety Photos Wound Measurements Length: (cm) 0 % Reduct Width: (cm) 0 % Reduct Depth: (cm) 0 Epitheli Area: (cm) 0 Volume: (cm) 0 Wound Description Classification: Full Thickness Without Exposed Support Foul Odo Structures Slough/F Wound Distinct, outline attached Margin: Exudate Small Amount: Exudate Serosanguineous Type: Exudate red,  brown Color: Wound Bed Granulation Amount: Large (67-100%) Granulation Quality: Red, Pink Fascia E Necrotic Amount: None Present (0%) Fat Laye Tendon E Muscle E Joint Ex Bone Exp r After Cleansing: No ibrino No Exposed Structure xposed: No r (Subcutaneous Tissue) Exposed: Yes xposed: No xposed: No posed: No osed: No ion in Area: 100% ion in Volume: 100% alization: Medium (34-66%) Electronic Signature(s) Signed: 04/28/2019 5:14:05 PM By: Mikeal Hawthorne EMT/HBOT Signed: 04/28/2019 5:44:09 PM By: [name] [name] Previous Signature: 04/25/2019 5:39:51 PM Version By: [name] [name] Entered By: Mikeal Hawthorne on 04/28/2019 15:54:03 -------------------------------------------------------------------------------- Wound Assessment Details Patient Name: Date of Service: [name], [name] 04/25/2019 1:15 PM Medical Record [id number] Patient Account Number: [id number] Date of Birth/Sex: Treating RN: 03-10-1946 (74 y.o. [name] [name] Primary Care Vincient Vanaman: [name] [name] Other Clinician: Referring Zavien Clubb: Treating Doll Frazee/Extender:[name] [name], [name] [name], [name] [name] [name]: 15 Wound Status Wound Number: [id number] Primary Lymphedema Etiology: Wound Location: Left Lower Leg - Anterior Wound Open Wounding Event: Gradually Appeared Status: Date Acquired: 04/11/2019 Comorbid Cataracts, Lymphedema, Arrhythmia, Weeks Of [name]: 2 History: Congestive Heart Failure, Hypertension, Clustered Wound: No Peripheral Venous Disease, Type II Diabetes, Osteoarthritis, Neuropathy,  Confinement Anxiety Photos Wound Measurements Length: (cm) 0.2 % Reduction i Width: (cm) 0.2 % Reduction i Depth: (cm) 0.1 Epithelializa Area: (cm) 0.031 Tunneling: Volume: (cm) 0.003 Undermining: Wound Description Classification: Partial Thickness Foul Odor Af Wound Margin: Distinct, outline attached Slough/Fibri Exudate Amount: Medium Exudate Type: Serous Exudate Color: amber Wound  Bed Granulation Amount: Large (67-100%) Granulation Quality: Pink Fascia Expos Necrotic Amount: None Present (0%) Fat Layer (S Tendon Expos Muscle Expos Joint Expose Bone Exposed Limited to S [name] Notes Wound #39 (Left, Anterior Lower Leg) 1. Cleanse With Wound Cleanser Soap and water 2. Periwound Care TCA Cream 3. Primary Dressing Applied Calcium Alginate Ag 4. Secondary Dressing ABD Pad Roll Gauze ter Cleansing: No no Yes Exposed Structure ed: No ubcutaneous Tissue) Exposed: No ed: No ed: No d: No : No kin Breakdown n Area: 80.3% n Volume: 81.3% tion: Large (67-100%) No No Notes Lymphedema wrap applied by PT Electronic Signature(s) Signed: 04/28/2019 5:14:05 PM By: Mikeal Hawthorne EMT/HBOT Signed: 04/28/2019 5:44:09 PM By: [name] [name] Previous Signature: 04/25/2019 5:39:51 PM Version By: [name] [name] Entered By: Mikeal Hawthorne on 04/28/2019 15:53:31 -------------------------------------------------------------------------------- Wound Assessment Details Patient Name: Date of Service: [name], [name] 04/25/2019 1:15 PM Medical Record [id number] Patient Account Number: [id number] Date of Birth/Sex: Treating RN: 1945/05/18 (74 y.o. [name] [name], [name] [name] Primary Care Vena Bassinger: [name] [name] Other Clinician: Referring Aditya Nastasi: Treating Sabriel Borromeo/Extender:[name] [name], [name] [name], [name] [name] [name]: 15 Wound Status Wound Number: 40 Primary Lymphedema Etiology: Wound Location: Right Lower Leg - Posterior Wound Open Wounding Event: Gradually Appeared Status: Date Acquired: 04/25/2019 Comorbid Cataracts, Lymphedema, Arrhythmia, Weeks Of [name]: 0 History: Congestive Heart Failure, Hypertension, Clustered Wound: No Peripheral Venous Disease, Type II Diabetes, Osteoarthritis, Neuropathy, Confinement Anxiety Photos Wound Measurements Length: (cm) 0.3 % Reduct Width: (cm) 0.3 % Reduct Depth: (cm) 0.1 Epitheli Area: (cm) 0.071  Tunneli Volume: (cm) 0.007 Undermi Wound Description Classification: Full Thickness Without Exposed Support Foul Odo Structures Slough/F Wound Distinct, outline attached Margin: Exudate Medium Amount: Exudate Serosanguineous Type: Exudate red, brown Color: Wound Bed Granulation Amount: Large (67-100%) Granulation Quality: Red, Pink Fascia E Necrotic Amount: None Present (0%) Fat Laye Tendon E Muscle E Joint Ex Bone Exp r After Cleansing: No ibrino No Exposed Structure xposed: No r (Subcutaneous Tissue) Exposed: No xposed: No xposed: No posed: No osed: No ion in Area: 0% ion in Volume: 0% alization: Small (1-33%) ng: No ning: No [name] Notes Wound #40 (Right, Posterior Lower Leg) 1. Cleanse With Wound Cleanser Soap and water 2. Periwound Care TCA Cream 3. Primary Dressing Applied Calcium Alginate Ag 4. Secondary Dressing Dry Gauze 6. Support Layer Applied 4 layer compression wrap Notes stocking Electronic Signature(s) Signed: 04/28/2019 5:14:05 PM By: Mikeal Hawthorne EMT/HBOT Signed: 04/28/2019 5:44:09 PM By: [name] [name] Previous Signature: 04/25/2019 5:39:51 PM Version By: [name] [name] Entered By: Mikeal Hawthorne on 04/28/2019 15:54:41 -------------------------------------------------------------------------------- Vitals Details Patient Name: Date of Service: Wuellner, Arelis 04/25/2019 1:15 PM Medical Record [id number] Patient Account Number: [id number] Date of Birth/Sex: Treating RN: 07/07/1945 (74 y.o. [name] [name], [name] [name] Primary Care Shakeera Rightmyer: [name] [name] Other Clinician: Referring Bakary Bramer: Treating Zina Pitzer/Extender:[name] [name], [name] [name], [name] [name] [name]: 15 Vital Signs Time Taken: 13:24 Temperature (F): 97.8 Height (in): 71 Pulse (bpm): 59 Weight (lbs): 240 Respiratory Rate (breaths/min): 20 Body Mass Index (BMI): 33.5 Blood Pressure (mmHg): 149/66 Reference Range: 80 - 120 mg / dl Electronic  Signature(s) Signed: 04/25/2019 5:39:51 PM By: [name] [name] Entered By: [name] [name] on 04/25/2019 13:25:12

## 2019-04-26 NOTE — Progress Notes (Signed)
BECKI, CORNELY ([id number]) Visit Report for 04/25/2019 HPI Details Patient Name: Date of Service: [name] [name], [name] [name] 04/25/2019 1:15 PM Medical Record [id number] Patient Account Number: [id number] Date of Birth/Sex: Treating RN: [birth year] (74 y.o. F) Primary Care Provider: [name] Other Clinician: Referring Provider: Treating Provider/Extender:Nohelia Valenza, Tenna Child, [place] Siren in Treatment: 15 History of Present Illness HPI Description: 04/09/15; the patient is here for review of wounds on her bilateral lower extremities for the last month. The patient has a history of lymphedema and bilateral chronic venous insufficiency with inflammation she was here for review of wounds on her bilateral legs in 2014 she was discharged home with external compression pumps which she is currently not using. Over the last month she developed wounds on her bilateral lower legs with drainage and pain and she is here for our review of this. 04/16/15; the patient's edema is under much better control. She has 3 areas one on the right anterior medial leg one on the left posterior leg and a small open area with purulent drainage on the left anterior leg. I have cultured the area on the left anterior leg 04/23/15; continued improvement in the patient's edema. All her wounds of closed I see no open areas. Culture from the left anterior leg last time was negative. READMISSION 02/07/16; this patient is a woman that I saw her earlier in 2017. I believe we discharged her very quickly with bilateral wounds in her legs secondary to chronic venous hypertension with inflammation and secondary lymphedema. She had juxtalite stockings. She has external compression pumps at home from a stay in our clinic in 2014. She tells me that she's had wounds on her bilateral lower legs since March or April of this year. 3 wounds on the left 2 on the right. She is a type II diabetic and has a history of  noncompressible vessels bilaterally although she is tolerated 4-layer compression. The last note from her primary physician Dr. [name] was in May. At that point Dr. [name] had referred the patient here for lymphedema management. The patient stated she made a phone call to year but was not able to arrange an appointment. It is not clear that she is actually using anything on the wounds currently except some gauze that was saturated with drainage per our intake nurse. She is certainly not using her juxtalite stockings and by the patient's own admission she is not using her compression pumps because they are "irritating". I don't see a recent hemoglobin A1c. The patient states she has not been systemically unwell but the legs are painful 02/14/16; patient tolerated her Profore wraps reasonably well. She is using the compression pumps 1 time per day. Measurements of her legs are better in terms of the amount of lymphedema 02/21/16; patient tolerated her Profore wraps it. She says she is using her compression pumps one or 2 times a day. Measurements of her leg circumference are improved and most of her wound dimensions are better, unfortunately the home health that we referred her to did not except her insurance but works successful in getting her accompanied that would except her insurance however she apparently has a $25 co-pay which she cannot afford as she is on a fixed income 03/05/16; patient states she did not back using the pumps. She is healed that 2 wounds on the left leg. She still has areas on the right lateral lower leg, right medial lower leg and left lateral lower leg. All of these look some better and have reduced in  area 03/13/16; still 3 wounds 1 on the left lateral leg, one on the left medial/posterior leg which is really the most extensive and a small area on the right lateral leg. All of this in the setting of severe chronic lymphedema and chronic  venous inflammation. Damage to the skin with cobblestone thickened skin. 03/20/16; still 2 areas. The area on the left lateral leg appears to of closed over. The right medial leg is still most extensive wound although it appears to be closing over right lateral only a small open area remains. Using silver alginate Profore wraps 03/27/16; the area on the left leg remains closed. The edema control is better on the left than the right. The right medial leg is still weeping but everything appears to be epithelialized. The lateral aspect of the right leg appears closed. She tells me she has old juxtalite stockings. She has been compliant with her compression pumps. 04/03/16; the area on the left leg remains closed. She has ordered new juxtalite stockings. The right posterior medial wound is fully epithelialized. However she has an irritated area over the right Achilles that she states is been itching all week under the wraps.. She has been compliant with her compression pumps 04/10/16; the area on the left leg remains closed. The right posterior medial wound is also closed today. The irritated area over the right Achilles which was probably wrap injury is also fully epithelialized and closed. The patient has not received her new juxtalite stockings, she arrives in clinic today with an old juxtalite stocking in place. Sheis using her compression pumps secondary to her severe stage III lymphedema READMISSION 01/21/17; this is a patient we know from several previous stays in this clinic. She has lymphedema with chronic venous insufficiency and inflammation. The last time she was here we discharged her with bilateral juxta lites which she is compliant with. She also has external compression pumps at home from stays in our clinic in 2014 although she tells me that she is not very compliant with these as when she uses them a causes pain in her bilateral knees the patient states that her wounds on her left  greater than right leg including 2 large areas on the left anterior and left lateral leg and an area on the right leg open in late June or July since then she is been followed by [facility] and she's had bilateral Unna boot supplied every week for the last month.. I think the wounds have actually improved although they are not specifically dressed. She states she had an ultrasound to rule out DVT bilaterally that was negative. She does not have arterial studies although she is a diabetic. She states she has a lot of pain in her legs she states she does not use her external compression pumps because it causes pain in her knees. As usual her ABIs were noncompressible bilaterally in this clinic 01/28/17; currently the patient only has one small wound on the left lateral leg and I think this should be healed next week. We are going to try to get her bilateral extremitease stockings. She tells me that compression pumps heard her posterior rib arthritic knees so she is not been compliant with this 02/04/17; the patient's wounds are totally healed. She has chronic venous insufficiency and secondary lymphedema. She has new extremitease stockings. She also has compression pumps Readmission: 04/21/17 on evaluation today patient appears to be doing about the same as what she was previous when we were evaluating her back  in November 2018. At that point she had completely healed. With that being said she has now reopened and states that she wasn't aware that she was supposed to come back. 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 Subsequently she also states that although she has the compression that we got for her previously the extremitease stockings she nonetheless doesn't always wear these and being noncompliant often has things will reopen as far as ulcers on her lower extremities. No fevers,  chills, nausea, or vomiting noted at this time. Patient has no evidence of dementia. She is having some discomfort in regard to the ulcers at this point. 04/28/17 on evaluation today patient appears to be doing significantly better in regard to her wounds. With that being said she is still having significant discomfort and is not really keen on sharp debridement. She does not appear to have any evidence of infection which is good news I do think the Iodoflex is beneficial for her. Of note her ABI's were found in the system to be on the right 1.21 with a TBI of 0.76 and on the left 1.35 with a TBI of 0.75. In general her swelling appears to be significantly improved however. No fevers, chills, nausea, or vomiting noted at this time. Patient has been tolerating the wraps without complication. She has no evidence of dementia. 05/05/17-she is here in follow-up evaluation for bilateral lower extremity venous and lymphedema ulcers. She states she has not been using her lymphedema pumps secondary to pain at her knees. After discussion it was noted that she uses knee-high lymphedema pumps but does have a pair of thigh-high lymphedema pumps. She was strongly advised to use the thigh-high lymphedema pumps and if necessary reduce the setting for improved tolerance. She reports no change in odor and/or drainage. She continues to tolerate sharp debridement poorly with significant pain, primarily to the right lower extremity. We will continue with current treatment plan, along with improved lymphedema pump use and follow-up next week 05/12/17 on evaluation today patient actually appears to be doing much better in regard to her bilateral lower extremity ulcers. Only the right altar actually requires debridement today. The other two cleaned off nicely in two of the three in fact on the left lower extremity or actually almost completely healed. She does have some weeping noted still the bilateral lower extremities left  greater than right. She still is not able to use her compression pumps even though I have mentioned this several times to her as did Leah last week. No fevers, chills, nausea, or vomiting noted at this time. Patient has been tolerating the dressing changes without complication. Patient has no evidence of dementia 05/19/17 on evaluation today patient appears to be doing very well regard to her bilateral lower extremity ulcers. In fact she seems to be improving and in fact healed in a couple of locations that were giving her trouble last week. Overall I'm extremely pleased with how things have progressed. She still has some discomfort especially in the right lateral lower extremity. Fortunately there does not appear to be any evidence of significant infection which is good news she does tell me that she has used her compression pumps several times although not routinely over the past week I did praise her for this. I do think it will be beneficial and that might even be what's helping with her wounds as far as healing  is concerned at least to a degree. No fevers, chills, nausea, or vomiting noted at this time. Patient has no dementia and no evidence of infection. 05/26/17 on evaluation today patient presents for evaluation concerning her bilateral lower extremity ulcer secondary to lymphedema. Fortunately she appears to be doing very well at this point in time. Specifically her right leg appears to be completely closed and healed. The left leg though there is still a small area of opening overall has healed. She does have a little bit of excoriation at the top or the wraps are because they slid down on her. Otherwise there's no evidence of infection and the other has improved dramatically now that she is not draining as significantly. Patient is having no significant pain she tells me she has been able to use her lymphedema pumps one time a day although that's not all she can tolerate. Even that is  difficult for her. 06/02/17 on evaluation today patient's ulcers appear to be completely closed at this point. She does still note some odor but I believe this is due to the fact she has not been able to wash her legs as well currently. Fortunately she did bring her compression with her today. She does have the EXTREMIT-EASE Compression. With that being said she is somewhat nervous about going back to this as previously she broke out yet again once we discontinue wrapping her. READMISSION 11/23/17; patient is now 74 year old diabetic woman with severe chronic venous insufficiency with lymphedema. We care for her last in this clinic in February 2019 at which time she had chronic venous insufficiency with lymphedema bilateral lower extremity ulcers. We discharged her with bilateral wrap around/extremitease stockings and compression pumps. Is fairly clear she is not using the compression pumps stating it causes pain in her knees. In any case she was admitted to hospital from 8/3 through 8/6 with a several week history of recurrent bilateral ulcers. She was felt to have bilateral cellulitis treated with doxycycline IV. She is still on oral doxycycline and has another 2 days worth. At discharge her white count was 14.1 hemoglobin at 8.9. The patient is a diabetic however her last ABIs in 2018 showed a right ABI of 1.21, left of 1.35. TBI's were 0.76 on the right 0.75 on the left. Posterior tibial artery was monophasic on the right triphasic on the left. Dorsalis pedis triphasic on the right and triphasic on the left. She is not felt to have significant macrovascular disease 12/02/17;significant bilateral lower extremity wounds secondary to severe uncontrolled lymphedema. She did not get home health out to change the wraps. So she kept this on all week. She is telling me she using his her compression pumps once a week. In general all the wounds look better. We've been using silver alginate with secondary  absorptive dressings 4 layer compression 12/10/17; bilateral lower extremity wounds secondary to uncontrolled lymphedema. She tells me she is using her compression pumps on most days. We've been keeping wraps on all week. Using silver alginate. She has wounds on the right lateral calf left medial and left lateral calf. All of this is somewhat better 12/16/17; the patient has 3 small open areas. One on the right lateral calf, one on the left medial calf and the small area on the left lateral calf. She has lymphedema, chronic venous insufficiency with hemosiderin deposition. Very fibrotic distal lower extremity scan with suggestion of an inverted bottle sign appearance to her legs. She has compression pumps but she does not use  them has not used them in the last week. Currently we have her in 4 layer compression, silver alginate to the wounds and this is being changed once by home health. She is noncompliant with the compression pumps because she says it hurts her knees. She also has compression stockings ando Juxta light stockings at home 12/24/17; the patient's right lateral calf is healed as well as the left medial. There is no open wound on the right leg. She still has 2 areas on the left lateral calf. We transitioned the right leg into an extremitease stocking which she brought in from home. 12/31/17; the patient has a superficial wound on the left lateral lower calf. We've been wrapping this leg. She arrives today with the right leg swollen but without a wound. She cannot get her extremities stocking on the right leg. There is a lot more edema here. She is not using her compression pumps 01/10/2018; patient has a same superficial wound on the left lateral calf. Her right leg has less peripheral edema. She tells me she also started herself on some Lasix that had been previously prescribed for her at some point but she had never taken it. She is also concerned about whether her compression pumps are  leaking. She has extremitease stockings however the edema gets bad enough she can get these on. Currently she has no open wound on the right leg and is mention the edema is actually better than when I saw this 10 days ago 01/17/2018; patient has a same superficial area on the posterior left calf and a weeping area in the mid part of the right lateral calf. She still does not have anyone from [facility] coming to see her compression pumps which she describes as being suspicious for a leakage. I am not really sure how frequently she is using this in any case. She has massive bilateral calf lymphedema. I think there is some spreading lymphedema into her posterior thighs. 01/24/2018; the patient's edema in her legs is actually some better. She still has a smaller area on the left posterior calf although this is better. The weeping area on the right lateral calf is also better. As it turned out she did not get her pumps from [facility] but [facility] and they are going to try to go out and see these this week which is going to help. I explained to her that she will use the compression pumps once a day while we are wrapping her legs but when she transitions to stockings that may be she will require pumping twice a day. Her compliance in this area has not been high and I wonder about her ability to do this herself. 02/01/2018; the area on the posterior left leg is healed. She still has the original wound on the right lateral calf which is epithelialized. The however just above this there is still weeping lymphedema fluid. She has a new area on the upper right calf which is either wrap injury or is she points out where she has been scratching under the wraps. We still not have managed to get a hold of a representative of [facility] to see if the pump is operational. She has extremitease stockings at home however I am not of the opinion that this will maintain her skin  integrity without the compression pumps. 02/08/2018; posterior left leg remains healed although she has a new small open area on the left lateral calf. Right lateral calf has two quarter sized open  areas and close juxtaposition. There is less weeping edema fluid. She apparently managed to contact [facility]. They are sending her a part. But she is still not received it 02/15/2018; the patient has no open wound on either leg. Some scale present on the right upper lateral calf just below the fibular head. Edema control is excellent. She did not bring her stockings or her juxta lite wrap around. She has the part for her compression pump but she has not use the compression pumps yet READMISSION 01/10/2019 Patient is now a 74 year old woman that we have had in the clinic multiple times in the past. She has chronic lower extremity lymphedema, recurrent wounds on her bilateral lower extremities. She is also a type II diabetic reasonably well controlled with a recent hemoglobin A1c while she was in the hospital of 5.9. She has wounds on her bilateral lower extremities mostly on the left lateral and right lateral. These were noted also during her recent hospitalization. The patient states they have been there for a while. She has not been able to wear her stockings he has trouble getting them on as she lives alone. She also has external compression pumps but uses them sparingly because it causes pain in her knees which hurt because of osteoarthritis. The patient was on doxycycline before she went into the hospital apparently for fear of infection in her legs and is on Keflex coming out because of a UTI The patient was recently in the hospital from 01/06/2019 through 01/09/2019. She was admitted with nausea and vomiting and prerenal azotemia. This was corrected with IV fluid her Lasix has been put on hold. Hemoglobin A1c at 5.9 her metformin was discontinued. She was also felt to have a UTI she was  prescribed Keflex at 503 times a day she is picking that up today. The patient is not felt to have an arterial issue. ABIs in our clinic were 1.24 on the right and 1.06 on the left. She had formal studies in 2018 at which time her ABIs were 1.21 on the right TBI of 0.76 and on the left 1.35 and 0.75 respectively. Waveforms are on the right were monophasic and biphasic, triphasic and biphasic on the left. She is tolerated 4 layer compression in the past 10/13. Comes in today having not used her compression pumps /perhaps once in the last week. She has too much edema to consider healing these wounds. We have been using silver alginate 10/20; she tells me he had she used her compression pumps once a day as we asked. Clearly she has edema out of her legs although most of it seems to be settling around her knees which is what she complains about. We have been using silver alginate to the extensive de-epithelialized area on the left lateral calf and a much smaller area on the right lateral extending linearly into the right posterior 10/27; she is using her compression pumps daily. Her edema control is better. We have been using silver alginate to the extensive wound areas on her bilateral lower legs. This includes left anterior left lateral and left posterior. Right anterior right lateral and right posterior. The wounds are not all in the same condition. She has severe bilateral skin damage with the epithelialization, flaking dried epithelium 11/3; she is using her compression pumps 1 times daily. Pretty obvious that her wraps fell down especially on the left leg to mid calf. I think things are improving on the right lateral calf, I do not see anything open  posteriorly. The major areas on the left lateral where she has 3 or 4 deeper wounds in the midst of an entire de-epithelialized area. There is erythema here but no tenderness I think this represents venous stasis rather than cellulitis. I debrided  the areas on the left lateral calf last week but once again they have tightly adherent debris over the top of them which is disappointing 11/10; the patient's major areas on the left lateral calf to 3 wounds with some depth surrounded by a large area of de-epithelialized tissue. There is no infection. We have been using silver alginate under 4-layer compression. On the right lateral she has a more contained wound area that is superficial. Been using silver alginate under 4-layer compression 11/17; the patient's major wound is on the left lateral calf. We have been using silver alginate under compression. There is no open wound per se on the right but it de-epithelialized area on the lateral calf that is weeping edema fluid. I do not see much change here today. 12/1; patient arrives in clinic with her wounds looking better. Her edema control is better. The lymphedema specialist working on the left leg we have been working on the right. We have been using 4-layer compression. The patient is using a consider external compression pumps and Hydrofera Blue to the open areas. She is being measured for an abdominal compression pump and apparently that is going to go through her insurance 12/15; continued improvement. 2 small areas on the left and slightly larger 2 small areas on the right. 12/22; patient still has open areas on the right lateral calf which are superficial and the new one on the right anterior calf. She did not use her compression pumps this week. Her left leg is wrapped by our lymphedema specialist. There are no open wounds here 12/29; she still has a superficial area on the right lateral calf and area on the right medial calf. Right anterior wound area appears to have closed. She has much better edema control today than last week 04/11/2019; the patient is here with severe bilateral lymphedema, chronic venous insufficiency with marked skin changes in her lower extremities. We are  wrapping her right leg and her lymphedema specialist is putting lymphedema wraps on the left. She states she is using her compression pumps although in the past her compliance with uses been limited because of osteoarthritic pain in her knees. She only has one remaining area on the right leg however she has wrap injuries on the left at the upper level what where I think her wraps were cause. the left lateral area left posterior. These would be new this week 1/12; generally better. The wrap injuries in the upper calf on the left from last week are down to a single wound. She has 2 wounds anteriorly that are still weeping fluid although these appear to have been epithelialized surface for the most part. There is no open wounds on the right leg we are wrapping her right leg and 4-layer compression and we are going a lymphedema wrap on the left leg. The the approach is a F[place] 2000 wrap on the left leg and then subsequently the right leg. She claims to be compliant with her compression pumps 1/19; the patient has 1 tiny open area on the right medial calf. The rest of this is not open. She however has a large erythematous denuded area on the left anterior tibia extending laterally. There is no evidence of infection here. She has her new  current waist high compression pumps Electronic Signature(s) Signed: 04/25/2019 5:52:07 PM By: Linton Ham MD Entered By: Linton Ham on 04/25/2019 13:59:54 -------------------------------------------------------------------------------- Physical Exam Details Patient Name: Date of Service: [name] [name], [name] [name] 04/25/2019 1:15 PM Medical Record [id number] Patient Account Number: [id number] Date of Birth/Sex: Treating RN: Aug 30, 1945 (74 y.o. F) Primary Care Provider: [name] Other Clinician: Referring Provider: Treating Provider/Extender:Analeah Brame, Tenna Child, [place] Siren in Treatment: 15 Constitutional Patient is hypertensive.. Pulse  regular and within target range for patient.Marland Kitchen Respirations regular, non-labored and within target range.. Temperature is normal and within the target range for the patient.Marland Kitchen Appears in no distress. Cardiovascular Pedal pulses are palpable. Notes Wound exam Minuscule open wound on the right medial lower calf Large area anteriorly and laterally on the left minus surface epithelium, weeping edema and erythema compatible with stasis dermatitis damage Electronic Signature(s) Signed: 04/25/2019 5:52:07 PM By: Linton Ham MD Entered By: Linton Ham on 04/25/2019 14:03:12 -------------------------------------------------------------------------------- Physician Orders Details Patient Name: Date of Service: [name] [name], [name] [name] 04/25/2019 1:15 PM Medical Record [id number] Patient Account Number: [id number] Date of Birth/Sex: Treating RN: [birth year] (74 y.o. [name] Primary Care Provider: [name] Other Clinician: Referring Provider: Treating Provider/Extender:Luverne Farone, Tenna Child, [place] Siren in Treatment: 15 Verbal / Phone Orders: No Diagnosis Coding ICD-10 Coding Code Description I89.0 Lymphedema, not elsewhere classified L97.221 Non-pressure chronic ulcer of left calf limited to breakdown of skin L97.211 Non-pressure chronic ulcer of right calf limited to breakdown of skin E11.622 Type 2 diabetes mellitus with other skin ulcer Follow-up Appointments Return Appointment in 1 week. Nurse Visit: - Thursday for Lymphedema Dressing Change Frequency Wound #39 Left,Anterior Lower Leg Do not change entire dressing for one week. Skin Barriers/Peri-Wound Care Barrier cream Moisturizing lotion TCA Cream or Ointment Wound Cleansing May shower and wash wound with soap and water. Primary Wound Dressing Wound #39 Left,Anterior Lower Leg Calcium Alginate with Silver Wound #40 Right,Posterior Lower Leg Calcium Alginate with Silver Secondary Dressing Wound  #39 Left,Anterior Lower Leg Kerlix/Rolled Gauze - PT to apply lymphedema wrap over dressing ABD pad Wound #40 Right,Posterior Lower Leg Dry Gauze ABD pad Edema Control 4 layer compression - Right Lower Extremity - unna boot at top to help secure wrap Other: - left leg: PT to apply lymphedema wraps Electronic Signature(s) Signed: 04/25/2019 5:52:07 PM By: Linton Ham MD Signed: 04/26/2019 5:42:11 PM By: Carlene Coria RN Entered By: Carlene Coria on 04/25/2019 13:53:59 -------------------------------------------------------------------------------- Problem List Details Patient Name: Date of Service: [name] [name], [name] [name] 04/25/2019 1:15 PM Medical Record [id number] Patient Account Number: [id number] Date of Birth/Sex: Treating RN: 01/16/1946 (75 y.o. [name] Primary Care Provider: [name] Other Clinician: Referring Provider: Treating Provider/Extender:Tell Rozelle, Tenna Child, [place] Siren in Treatment: 15 Active Problems ICD-10 Evaluated Encounter Code Description Active Date Today Diagnosis I89.0 Lymphedema, not elsewhere classified 01/10/2019 No Yes L97.221 Non-pressure chronic ulcer of left calf limited to 01/10/2019 No Yes breakdown of skin L97.211 Non-pressure chronic ulcer of right calf limited to 01/10/2019 No Yes breakdown of skin E11.622 Type 2 diabetes mellitus with other skin ulcer 01/10/2019 No Yes I87.323 Chronic venous hypertension (idiopathic) with 04/25/2019 No Yes inflammation of bilateral lower extremity Inactive Problems Resolved Problems Electronic Signature(s) Signed: 04/25/2019 5:52:07 PM By: Linton Ham MD Entered By: Linton Ham on 04/25/2019 14:03:57 -------------------------------------------------------------------------------- Progress Note Details Patient Name: Date of Service: [name] [name], [name] [name] 04/25/2019 1:15 PM Medical Record [id number] Patient Account Number: [id number] Date of Birth/Sex: Treating  RN: [birth year] (74 y.o. F) Primary Care Provider: [name] Other Clinician: Referring Provider: Treating Provider/Extender:Manases Etchison, Legrand Como  [name] Weeks in Treatment: 15 Subjective History of Present Illness (HPI) 04/09/15; the patient is here for review of wounds on her bilateral lower extremities for the last month. The patient has a history of lymphedema and bilateral chronic venous insufficiency with inflammation she was here for review of wounds on her bilateral legs in 2014 she was discharged home with external compression pumps which she is currently not using. Over the last month she developed wounds on her bilateral lower legs with drainage and pain and she is here for our review of this. 04/16/15; the patient's edema is under much better control. She has 3 areas one on the right anterior medial leg one on the left posterior leg and a small open area with purulent drainage on the left anterior leg. I have cultured the area on the left anterior leg 04/23/15; continued improvement in the patient's edema. All her wounds of closed I see no open areas. Culture from the left anterior leg last time was negative. READMISSION 02/07/16; this patient is a woman that I saw her earlier in 2017. I believe we discharged her very quickly with bilateral wounds in her legs secondary to chronic venous hypertension with inflammation and secondary lymphedema. She had juxtalite stockings. She has external compression pumps at home from a stay in our clinic in 2014. She tells me that she's had wounds on her bilateral lower legs since March or April of this year. 3 wounds on the left 2 on the right. She is a type II diabetic and has a history of noncompressible vessels bilaterally although she is tolerated 4-layer compression. The last note from her primary physician Dr. [name] was in May. At that point Dr. [name] had referred the patient here for lymphedema management. The  patient stated she made a phone call to year but was not able to arrange an appointment. It is not clear that she is actually using anything on the wounds currently except some gauze that was saturated with drainage per our intake nurse. She is certainly not using her juxtalite stockings and by the patient's own admission she is not using her compression pumps because they are "irritating". I don't see a recent hemoglobin A1c. The patient states she has not been systemically unwell but the legs are painful 02/14/16; patient tolerated her Profore wraps reasonably well. She is using the compression pumps 1 time per day. Measurements of her legs are better in terms of the amount of lymphedema 02/21/16; patient tolerated her Profore wraps it. She says she is using her compression pumps one or 2 times a day. Measurements of her leg circumference are improved and most of her wound dimensions are better, unfortunately the home health that we referred her to did not except her insurance but works successful in getting her accompanied that would except her insurance however she apparently has a $25 co-pay which she cannot afford as she is on a fixed income 03/05/16; patient states she did not back using the pumps. She is healed that 2 wounds on the left leg. She still has areas on the right lateral lower leg, right medial lower leg and left lateral lower leg. All of these look some better and have reduced in area 03/13/16; still 3 wounds 1 on the left lateral leg, one on the left medial/posterior leg which is really the most extensive and a small area on the right lateral leg. All of this in the setting of severe chronic lymphedema and chronic venous inflammation. Damage  to the skin with cobblestone thickened skin. 03/20/16; still 2 areas. The area on the left lateral leg appears to of closed over. The right medial leg is still most extensive wound although it appears to be closing over right lateral only  a small open area remains. Using silver alginate Profore wraps 03/27/16; the area on the left leg remains closed. The edema control is better on the left than the right. The right medial leg is still weeping but everything appears to be epithelialized. The lateral aspect of the right leg appears closed. She tells me she has old juxtalite stockings. She has been compliant with her compression pumps. 04/03/16; the area on the left leg remains closed. She has ordered new juxtalite stockings. The right posterior medial wound is fully epithelialized. However she has an irritated area over the right Achilles that she states is been itching all week under the wraps.. She has been compliant with her compression pumps 04/10/16; the area on the left leg remains closed. The right posterior medial wound is also closed today. The irritated area over the right Achilles which was probably wrap injury is also fully epithelialized and closed. The patient has not received her new juxtalite stockings, she arrives in clinic today with an old juxtalite stocking in place. Sheis using her compression pumps secondary to her severe stage III lymphedema READMISSION 01/21/17; this is a patient we know from several previous stays in this clinic. She has lymphedema with chronic venous insufficiency and inflammation. The last time she was here we discharged her with bilateral juxta lites which she is compliant with. She also has external compression pumps at home from stays in our clinic in 2014 although she tells me that she is not very compliant with these as when she uses them a causes pain in her bilateral knees the patient states that her wounds on her left greater than right leg including 2 large areas on the left anterior and left lateral leg and an area on the right leg open in late June or July since then she is been followed by [facility] and she's had bilateral Unna boot supplied every week for the last month.. I  think the wounds have actually improved although they are not specifically dressed. She states she had an ultrasound to rule out DVT bilaterally that was negative. She does not have arterial studies although she is a diabetic. She states she has a lot of pain in her legs she states she does not use her external compression pumps because it causes pain in her knees. As usual her ABIs were noncompressible bilaterally in this clinic 01/28/17; currently the patient only has one small wound on the left lateral leg and I think this should be healed next week. We are going to try to get her bilateral extremitease stockings. She tells me that compression pumps heard her posterior rib arthritic knees so she is not been compliant with this 02/04/17; the patient's wounds are totally healed. She has chronic venous insufficiency and secondary lymphedema. She has new extremitease stockings. She also has compression pumps Readmission: 04/21/17 on evaluation today patient appears to be doing about the same as what she was previous when we were evaluating her back in November 2018. At that point she had completely healed. With that being said she has now reopened and states that she wasn't aware that she was supposed to come back. 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 Subsequently she also states that although she has the compression that we got for her previously the extremitease stockings she nonetheless doesn't always wear these and being noncompliant often has things will reopen as far as ulcers on her lower extremities. No fevers, chills, nausea, or vomiting noted at this time. Patient has no evidence of dementia. She is having some discomfort in regard to the ulcers at this point. 04/28/17 on evaluation today patient appears to be doing significantly better in regard to her wounds. With that  being said she is still having significant discomfort and is not really keen on sharp debridement. She does not appear to have any evidence of infection which is good news I do think the Iodoflex is beneficial for her. Of note her ABI's were found in the system to be on the right 1.21 with a TBI of 0.76 and on the left 1.35 with a TBI of 0.75. In general her swelling appears to be significantly improved however. No fevers, chills, nausea, or vomiting noted at this time. Patient has been tolerating the wraps without complication. She has no evidence of dementia. 05/05/17-she is here in follow-up evaluation for bilateral lower extremity venous and lymphedema ulcers. She states she has not been using her lymphedema pumps secondary to pain at her knees. After discussion it was noted that she uses knee-high lymphedema pumps but does have a pair of thigh-high lymphedema pumps. She was strongly advised to use the thigh-high lymphedema pumps and if necessary reduce the setting for improved tolerance. She reports no change in odor and/or drainage. She continues to tolerate sharp debridement poorly with significant pain, primarily to the right lower extremity. We will continue with current treatment plan, along with improved lymphedema pump use and follow-up next week 05/12/17 on evaluation today patient actually appears to be doing much better in regard to her bilateral lower extremity ulcers. Only the right altar actually requires debridement today. The other two cleaned off nicely in two of the three in fact on the left lower extremity or actually almost completely healed. She does have some weeping noted still the bilateral lower extremities left greater than right. She still is not able to use her compression pumps even though I have mentioned this several times to her as did Leah last week. No fevers, chills, nausea, or vomiting noted at this time. Patient has been tolerating the dressing changes without  complication. Patient has no evidence of dementia 05/19/17 on evaluation today patient appears to be doing very well regard to her bilateral lower extremity ulcers. In fact she seems to be improving and in fact healed in a couple of locations that were giving her trouble last week. Overall I'm extremely pleased with how things have progressed. She still has some discomfort especially in the right lateral lower extremity. Fortunately there does not appear to be any evidence of significant infection which is good news she does tell me that she has used her compression pumps several times although not routinely over the past week I did praise her for this. I do think it will be beneficial and that might even be what's helping with her wounds as far as healing is concerned at least to a degree. No fevers, chills, nausea, or vomiting noted at this time. Patient has no dementia and no evidence of infection. 05/26/17 on evaluation today patient presents for evaluation concerning her bilateral lower extremity ulcer secondary to lymphedema. Fortunately she appears to  be doing very well at this point in time. Specifically her right leg appears to be completely closed and healed. The left leg though there is still a small area of opening overall has healed. She does have a little bit of excoriation at the top or the wraps are because they slid down on her. Otherwise there's no evidence of infection and the other has improved dramatically now that she is not draining as significantly. Patient is having no significant pain she tells me she has been able to use her lymphedema pumps one time a day although that's not all she can tolerate. Even that is difficult for her. 06/02/17 on evaluation today patient's ulcers appear to be completely closed at this point. She does still note some odor but I believe this is due to the fact she has not been able to wash her legs as well currently. Fortunately she did bring her  compression with her today. She does have the EXTREMIT-EASE Compression. With that being said she is somewhat nervous about going back to this as previously she broke out yet again once we discontinue wrapping her. READMISSION 11/23/17; patient is now 74 year old diabetic woman with severe chronic venous insufficiency with lymphedema. We care for her last in this clinic in February 2019 at which time she had chronic venous insufficiency with lymphedema bilateral lower extremity ulcers. We discharged her with bilateral wrap around/extremitease stockings and compression pumps. Is fairly clear she is not using the compression pumps stating it causes pain in her knees. In any case she was admitted to hospital from 8/3 through 8/6 with a several week history of recurrent bilateral ulcers. She was felt to have bilateral cellulitis treated with doxycycline IV. She is still on oral doxycycline and has another 2 days worth. At discharge her white count was 14.1 hemoglobin at 8.9. The patient is a diabetic however her last ABIs in 2018 showed a right ABI of 1.21, left of 1.35. TBI's were 0.76 on the right 0.75 on the left. Posterior tibial artery was monophasic on the right triphasic on the left. Dorsalis pedis triphasic on the right and triphasic on the left. She is not felt to have significant macrovascular disease 12/02/17;significant bilateral lower extremity wounds secondary to severe uncontrolled lymphedema. She did not get home health out to change the wraps. So she kept this on all week. She is telling me she using his her compression pumps once a week. In general all the wounds look better. We've been using silver alginate with secondary absorptive dressings 4 layer compression 12/10/17; bilateral lower extremity wounds secondary to uncontrolled lymphedema. She tells me she is using her compression pumps on most days. We've been keeping wraps on all week. Using silver alginate. She has wounds on the  right lateral calf left medial and left lateral calf. All of this is somewhat better 12/16/17; the patient has 3 small open areas. One on the right lateral calf, one on the left medial calf and the small area on the left lateral calf. She has lymphedema, chronic venous insufficiency with hemosiderin deposition. Very fibrotic distal lower extremity scan with suggestion of an inverted bottle sign appearance to her legs. She has compression pumps but she does not use them has not used them in the last week. Currently we have her in 4 layer compression, silver alginate to the wounds and this is being changed once by home health. She is noncompliant with the compression pumps because she says it hurts her knees. She also  has compression stockings ando Juxta light stockings at home 12/24/17; the patient's right lateral calf is healed as well as the left medial. There is no open wound on the right leg. She still has 2 areas on the left lateral calf. We transitioned the right leg into an extremitease stocking which she brought in from home. 12/31/17; the patient has a superficial wound on the left lateral lower calf. We've been wrapping this leg. She arrives today with the right leg swollen but without a wound. She cannot get her extremities stocking on the right leg. There is a lot more edema here. She is not using her compression pumps 01/10/2018; patient has a same superficial wound on the left lateral calf. Her right leg has less peripheral edema. She tells me she also started herself on some Lasix that had been previously prescribed for her at some point but she had never taken it. She is also concerned about whether her compression pumps are leaking. She has extremitease stockings however the edema gets bad enough she can get these on. Currently she has no open wound on the right leg and is mention the edema is actually better than when I saw this 10 days ago 01/17/2018; patient has a same superficial  area on the posterior left calf and a weeping area in the mid part of the right lateral calf. She still does not have anyone from [facility] coming to see her compression pumps which she describes as being suspicious for a leakage. I am not really sure how frequently she is using this in any case. She has massive bilateral calf lymphedema. I think there is some spreading lymphedema into her posterior thighs. 01/24/2018; the patient's edema in her legs is actually some better. She still has a smaller area on the left posterior calf although this is better. The weeping area on the right lateral calf is also better. As it turned out she did not get her pumps from [facility] but [facility] and they are going to try to go out and see these this week which is going to help. I explained to her that she will use the compression pumps once a day while we are wrapping her legs but when she transitions to stockings that may be she will require pumping twice a day. Her compliance in this area has not been high and I wonder about her ability to do this herself. 02/01/2018; the area on the posterior left leg is healed. She still has the original wound on the right lateral calf which is epithelialized. The however just above this there is still weeping lymphedema fluid. She has a new area on the upper right calf which is either wrap injury or is she points out where she has been scratching under the wraps. We still not have managed to get a hold of a representative of [facility] to see if the pump is operational. She has extremitease stockings at home however I am not of the opinion that this will maintain her skin integrity without the compression pumps. 02/08/2018; posterior left leg remains healed although she has a new small open area on the left lateral calf. Right lateral calf has two quarter sized open areas and close juxtaposition. There is less weeping edema fluid.  She apparently managed to contact [facility]. They are sending her a part. But she is still not received it 02/15/2018; the patient has no open wound on either leg. Some scale present on the right upper  lateral calf just below the fibular head. Edema control is excellent. She did not bring her stockings or her juxta lite wrap around. She has the part for her compression pump but she has not use the compression pumps yet READMISSION 01/10/2019 Patient is now a 74 year old woman that we have had in the clinic multiple times in the past. She has chronic lower extremity lymphedema, recurrent wounds on her bilateral lower extremities. She is also a type II diabetic reasonably well controlled with a recent hemoglobin A1c while she was in the hospital of 5.9. She has wounds on her bilateral lower extremities mostly on the left lateral and right lateral. These were noted also during her recent hospitalization. The patient states they have been there for a while. She has not been able to wear her stockings he has trouble getting them on as she lives alone. She also has external compression pumps but uses them sparingly because it causes pain in her knees which hurt because of osteoarthritis. The patient was on doxycycline before she went into the hospital apparently for fear of infection in her legs and is on Keflex coming out because of a UTI The patient was recently in the hospital from 01/06/2019 through 01/09/2019. She was admitted with nausea and vomiting and prerenal azotemia. This was corrected with IV fluid her Lasix has been put on hold. Hemoglobin A1c at 5.9 her metformin was discontinued. She was also felt to have a UTI she was prescribed Keflex at 503 times a day she is picking that up today. The patient is not felt to have an arterial issue. ABIs in our clinic were 1.24 on the right and 1.06 on the left. She had formal studies in 2018 at which time her ABIs were 1.21 on the right TBI of  0.76 and on the left 1.35 and 0.75 respectively. Waveforms are on the right were monophasic and biphasic, triphasic and biphasic on the left. She is tolerated 4 layer compression in the past 10/13. Comes in today having not used her compression pumps /perhaps once in the last week. She has too much edema to consider healing these wounds. We have been using silver alginate 10/20; she tells me he had she used her compression pumps once a day as we asked. Clearly she has edema out of her legs although most of it seems to be settling around her knees which is what she complains about. We have been using silver alginate to the extensive de-epithelialized area on the left lateral calf and a much smaller area on the right lateral extending linearly into the right posterior 10/27; she is using her compression pumps daily. Her edema control is better. We have been using silver alginate to the extensive wound areas on her bilateral lower legs. This includes left anterior left lateral and left posterior. Right anterior right lateral and right posterior. The wounds are not all in the same condition. She has severe bilateral skin damage with the epithelialization, flaking dried epithelium 11/3; she is using her compression pumps 1 times daily. Pretty obvious that her wraps fell down especially on the left leg to mid calf. I think things are improving on the right lateral calf, I do not see anything open posteriorly. The major areas on the left lateral where she has 3 or 4 deeper wounds in the midst of an entire de-epithelialized area. There is erythema here but no tenderness I think this represents venous stasis rather than cellulitis. I debrided the areas on the left  lateral calf last week but once again they have tightly adherent debris over the top of them which is disappointing 11/10; the patient's major areas on the left lateral calf to 3 wounds with some depth surrounded by a large area  of de-epithelialized tissue. There is no infection. We have been using silver alginate under 4-layer compression. On the right lateral she has a more contained wound area that is superficial. Been using silver alginate under 4-layer compression 11/17; the patient's major wound is on the left lateral calf. We have been using silver alginate under compression. There is no open wound per se on the right but it de-epithelialized area on the lateral calf that is weeping edema fluid. I do not see much change here today. 12/1; patient arrives in clinic with her wounds looking better. Her edema control is better. The lymphedema specialist working on the left leg we have been working on the right. We have been using 4-layer compression. The patient is using a consider external compression pumps and Hydrofera Blue to the open areas. She is being measured for an abdominal compression pump and apparently that is going to go through her insurance 12/15; continued improvement. 2 small areas on the left and slightly larger 2 small areas on the right. 12/22; patient still has open areas on the right lateral calf which are superficial and the new one on the right anterior calf. She did not use her compression pumps this week. Her left leg is wrapped by our lymphedema specialist. There are no open wounds here 12/29; she still has a superficial area on the right lateral calf and area on the right medial calf. Right anterior wound area appears to have closed. She has much better edema control today than last week 04/11/2019; the patient is here with severe bilateral lymphedema, chronic venous insufficiency with marked skin changes in her lower extremities. We are wrapping her right leg and her lymphedema specialist is putting lymphedema wraps on the left. She states she is using her compression pumps although in the past her compliance with uses been limited because of osteoarthritic pain in her knees. She only has  one remaining area on the right leg however she has wrap injuries on the left at the upper level what where I think her wraps were cause. the left lateral area left posterior. These would be new this week 1/12; generally better. The wrap injuries in the upper calf on the left from last week are down to a single wound. She has 2 wounds anteriorly that are still weeping fluid although these appear to have been epithelialized surface for the most part. There is no open wounds on the right leg we are wrapping her right leg and 4-layer compression and we are going a lymphedema wrap on the left leg. The the approach is a [place] 2000 wrap on the left leg and then subsequently the right leg. She claims to be compliant with her compression pumps 1/19; the patient has 1 tiny open area on the right medial calf. The rest of this is not open. She however has a large erythematous denuded area on the left anterior tibia extending laterally. There is no evidence of infection here. She has her new current waist high compression pumps Objective Constitutional Patient is hypertensive.. Pulse regular and within target range for patient.Marland Kitchen Respirations regular, non-labored and within target range.. Temperature is normal and within the target range for the patient.Marland Kitchen Appears in no distress. Vitals Time Taken: 1:24 PM, Height: 71  in, Weight: 240 lbs, BMI: 33.5, Temperature: 97.8 F, Pulse: 59 bpm, Respiratory Rate: 20 breaths/min, Blood Pressure: 149/66 mmHg. Cardiovascular Pedal pulses are palpable. General Notes: Wound exam ooMinuscule open wound on the right medial lower calf ooLarge area anteriorly and laterally on the left minus surface epithelium, weeping edema and erythema compatible with stasis dermatitis damage Integumentary (Hair, Skin) Wound #38 status is Healed - Epithelialized. Original cause of wound was Gradually Appeared. The wound is located on the Left,Posterior Lower Leg. The wound measures 0cm  length x 0cm width x 0cm depth; 0cm^2 area and 0cm^3 volume. Wound #39 status is Open. Original cause of wound was Gradually Appeared. The wound is located on the Left,Anterior Lower Leg. The wound measures 0.2cm length x 0.2cm width x 0.1cm depth; 0.031cm^2 area and 0.003cm^3 volume. The wound is limited to skin breakdown. There is no tunneling or undermining noted. There is a medium amount of serous drainage noted. The wound margin is distinct with the outline attached to the wound base. There is large (67-100%) pink granulation within the wound bed. There is no necrotic tissue within the wound bed. Wound #40 status is Open. Original cause of wound was Gradually Appeared. The wound is located on the Right,Posterior Lower Leg. The wound measures 0.3cm length x 0.3cm width x 0.1cm depth; 0.071cm^2 area and 0.007cm^3 volume. There is no tunneling or undermining noted. There is a medium amount of serosanguineous drainage noted. The wound margin is distinct with the outline attached to the wound base. There is large (67-100%) red, pink granulation within the wound bed. There is no necrotic tissue within the wound bed. Assessment Active Problems ICD-10 Lymphedema, not elsewhere classified Non-pressure chronic ulcer of left calf limited to breakdown of skin Non-pressure chronic ulcer of right calf limited to breakdown of skin Type 2 diabetes mellitus with other skin ulcer Chronic venous hypertension (idiopathic) with inflammation of bilateral lower extremity Procedures Wound #40 Pre-procedure diagnosis of Wound #40 is a Lymphedema located on the Right,Posterior Lower Leg . There was a Four Layer Compression Therapy Procedure by Carlene Coria, RN. Post procedure Diagnosis Wound #40: Same as Pre-Procedure Plan Follow-up Appointments: Return Appointment in 1 week. Nurse Visit: - Thursday for Lymphedema Dressing Change Frequency: Wound #39 Left,Anterior Lower Leg: Do not change entire dressing  for one week. Skin Barriers/Peri-Wound Care: Barrier cream Moisturizing lotion TCA Cream or Ointment Wound Cleansing: May shower and wash wound with soap and water. Primary Wound Dressing: Wound #39 Left,Anterior Lower Leg: Calcium Alginate with Silver Wound #40 Right,Posterior Lower Leg: Calcium Alginate with Silver Secondary Dressing: Wound #39 Left,Anterior Lower Leg: Kerlix/Rolled Gauze - PT to apply lymphedema wrap over dressing ABD pad Wound #40 Right,Posterior Lower Leg: Dry Gauze ABD pad Edema Control: 4 layer compression - Right Lower Extremity - unna boot at top to help secure wrap Other: - left leg: PT to apply lymphedema wraps 1. The patient is doing very well except for the area on the left anterior to lateral lower leg today. 2. We are using lymphedema wraps on this side. 3. Only a small area on the right medial calf which should be closed next week 4. We are trying to get her lymphedema garments paid for through her insurance and we have signed these forms today Electronic Signature(s) Signed: 04/25/2019 5:52:07 PM By: Linton Ham MD Entered By: Linton Ham on 04/25/2019 14:05:08 -------------------------------------------------------------------------------- SuperBill Details Patient Name: Date of Service: [name] [name], [name] [name] 04/25/2019 Medical Record [id number] Patient Account Number: [id number] Date of Birth/Sex: Treating RN:  06/07/1945 (74 y.o. [name] Primary Care Provider: [name] Other Clinician: Referring Provider: Treating Provider/Extender:Adalind Weitz, Tenna Child, [place] Siren in Treatment: 15 Diagnosis Coding ICD-10 Codes Code Description I89.0 Lymphedema, not elsewhere classified L97.221 Non-pressure chronic ulcer of left calf limited to breakdown of skin L97.211 Non-pressure chronic ulcer of right calf limited to breakdown of skin E11.622 Type 2 diabetes mellitus with other skin ulcer I87.323 Chronic venous  hypertension (idiopathic) with inflammation of bilateral lower extremity Facility Procedures CPT4 Code Description: [id number] (Facility Use Only) [phone] - APPLY [place] LWR RT LEG Modifier: Quantity: 1 Physician Procedures CPT4 Code Description: [id number] - WC PHYS LEVEL 3 - EST PT ICD-10 Diagnosis Description L97.221 Non-pressure chronic ulcer of left calf limited to brea I89.0 Lymphedema, not elsewhere classified L97.211 Non-pressure chronic ulcer of right calf  limited to bre I87.323 Chronic venous hypertension (idiopathic) with inflammat extremity Modifier: kdown of skin akdown of skin ion of bilateral Quantity: 1 lower Electronic Signature(s) Signed: 04/25/2019 5:52:07 PM By: Linton Ham MD Entered By: Linton Ham on 04/25/2019 14:05:36

## 2019-04-27 ENCOUNTER — Ambulatory Visit: Payer: PPO

## 2019-04-27 ENCOUNTER — Other Ambulatory Visit: Payer: Self-pay

## 2019-04-27 ENCOUNTER — Other Ambulatory Visit: Payer: Self-pay | Admitting: Internal Medicine

## 2019-04-27 DIAGNOSIS — R2689 Other abnormalities of gait and mobility: Secondary | ICD-10-CM

## 2019-04-27 DIAGNOSIS — I890 Lymphedema, not elsewhere classified: Secondary | ICD-10-CM | POA: Diagnosis not present

## 2019-04-27 DIAGNOSIS — I471 Supraventricular tachycardia: Secondary | ICD-10-CM

## 2019-04-27 NOTE — Therapy (Signed)
[place], [place], [address] Phone: [phone]   Fax:  [phone]  Physical Therapy Treatment  Patient Details  Name: [name] [name] MRN: [id number] Date of Birth: [birth year] Referring Provider (PT): Darci Current   Encounter Date: 04/27/2019  PT End of Session - 04/27/19 [id number]    Visit Number  16    Number of Visits  25    Date for PT Re-Evaluation  05/09/19    PT Start Time  [id number]    PT Stop Time  1505    PT Time Calculation (min)  45 min    Activity Tolerance  Patient tolerated treatment well    Behavior During Therapy  [facility] for tasks assessed/performed       Past Medical History:  Diagnosis Date  . Acute kidney injury ([place])   . Aortic stenosis, mild 11/17/2013  . Arthritis    "both knees, spine, back, fingers" (11/29/2013)  . CHF (congestive heart failure) ([place])   . Edema   . GERD (gastroesophageal reflux disease)   . Heart murmur   . History of diastolic dysfunction    Echo [id number] with diastolic dysfunction  . HTN (hypertension)   . Morbid obesity ([place])   . OA (osteoarthritis)   . PAC (premature atrial contraction)    per prior Holter  . Palpitations   . Pneumonia    "as a child"  . PVC's (premature ventricular contractions)    per prior Holter  . SVT (supraventricular tachycardia) ([place])   . Tachycardia    noted at 07/28/11 visit. Started on beta blocker. Possible atrial flutter vs long PR tachycardia/AVNRT  . Type II diabetes mellitus ([place])     Past Surgical History:  Procedure Laterality Date  . CESAREAN SECTION  1985  . CESAREAN SECTION  1985  . SUPRAVENTRICULAR TACHYCARDIA ABLATION  11/29/2013  . SUPRAVENTRICULAR TACHYCARDIA ABLATION N/A 11/29/2013   Procedure: SUPRAVENTRICULAR TACHYCARDIA ABLATION;  Surgeon: Evans Lance, MD;  Location: [facility] C[facility] LAB;  Service: Cardiovascular;  Laterality: N/A;  . US ECHOCARDIOGRAPHY  07/25/2008   EF 55-60%  . VAGINAL DELIVERY       There were no vitals filed for this visit.  Subjective Assessment - 04/27/19 1508    Subjective  Pt reports that she used her pump but wanted to know why it went over her waist.    Pertinent History  osteo arthritis, hx cellulitis, phlebitis and history of edema in BLE, Hx Acute kidney injury, CHF and DM II    Patient Stated Goals  I want to get this swelling down and heal my wound.    Currently in Pain?  Yes    Pain Score  8     Pain Location  Knee    Pain Orientation  Right;Left    Pain Descriptors / Indicators  Aching    Pain Type  Chronic pain    Pain Onset  More than a month ago    Pain Frequency  Intermittent    Aggravating Factors   movement    Pain Relieving Factors  rest                       [facility] Adult PT Treatment/Exercise - 04/27/19 0001      Manual Therapy   Manual Therapy  Edema management;Compression Bandaging    Edema Management  Pt encouraged to try pump 2x/day over the weekend to see if it works.     Compression Bandaging  Pt wrapped this session using tubigrip as base layer with 3 artiflex for shaping. 2 8 cm for roman sandal and ankle/sole/heel, 4 10 cm for spiral wrap and 1 12 cm for thigh. Ironing throughout to prevent wrinkles no significant pull this session due to pt had good reduction w/o much pressure last session.               PT Education - 04/27/19 1510    Education Details  Pt will do her pump 2x/day over the weekend until she sees her physical therapist next week on Tuesday. She will pay attention during the pump and let physical therapist know if she has any significant discomfort at the trunk so that the vasopnuematic garment pressure at the abdomen can be decreased. She was educated that it is not suppose to be any kind of discomfort and that this can be adjusted. She was concerned that it puts stress on her organs and was advised that unless the garment is squeezing too tight this does not happen.    Person(s) Educated   Patient    Methods  Explanation    Comprehension  Verbalized understanding       PT Short Term Goals - 04/04/19 1515      PT SHORT TERM GOAL #1   Title  Pt will be independent with HEP    Baseline  Pt is working on her HEP occasionally at home.    Time  5    Period  Weeks    Status  On-going        PT Long Term Goals - 04/04/19 1515      PT LONG TERM GOAL #1   Title  Patient will have reduction of L lower leg limb girth by 3 cm    Baseline  Pt will be measuremed next session she has significantly reduced from her initial evaluation.    Time  5    Status  On-going    Target Date  05/23/19      PT LONG TERM GOAL #2   Title  Patient to be properly fitted with compression garment to wear on daily basis.    Baseline  Pt has a juxtalite that is too small and old that ht evelcro is no longer usable on. Trying to get her a [employer].    Time  5    Period  Weeks    Status  On-going    Target Date  05/23/19      PT LONG TERM GOAL #3   Title  Patient will be independent in self-care management principles including self-massage/bandaging and long term management plan for edema.    Baseline  Pt has difficulty with managing her lymphedema. Trying to ger her a pump to help with swelling.    Time  5    Period  Weeks    Status  On-going    Target Date  05/23/19            Plan - 04/27/19 1514    Clinical Impression Statement  Pt wound was not dressed this session; it remained dressed from Tuesday. Pt presents with significant visual improvement in edema in her LLE since her last session. Pt did not wear double tubigrip this session and continued to wrap over the knee this session with layered compressoin wrap. Significant softening of small pannus at the medial L distal thigh. Pt was encouraged to pump her legs in vasopneumatic pump 2x/day over the weekend to see if  this doesn't help with edema. Pt will benefit from continued POC at this time until she is able to manage at home  with the correct equipment.    Personal Factors and Comorbidities  Age;Comorbidity 3+    Comorbidities  CHF, acute kidney injury, DM II    Clinical Decision Making  Low    Rehab Potential  Good    PT Frequency  2x / week    PT Duration  12 weeks    PT Treatment/Interventions  Electrical Stimulation;Cryotherapy;Gait training;Stair training;Functional mobility training;Therapeutic activities;Therapeutic exercise;Balance training;Neuromuscular re-education;Patient/family education;Manual lymph drainage;Compression bandaging;Passive range of motion;Taping;Vasopneumatic Device    PT Next Visit Plan  ask if she has used pump 2x/day over the weekend, Assess wrap, give exercises, assess self MLD and possible work out pants?    PT Home Exercise Plan  ask about pump, provide exercises    Consulted and Agree with Plan of Care  Patient       Patient will benefit from skilled therapeutic intervention in order to improve the following deficits and impairments:  Difficulty walking, Increased edema  Visit Diagnosis: Lymphedema  Other abnormalities of gait and mobility     Problem List Patient Active Problem List   Diagnosis Date Noted  . Postnasal drip 11/16/2017  . Sensorineural hearing loss (SNHL), bilateral 11/16/2017  . Tinnitus of both ears 11/16/2017  . Obesity, Class II, BMI 35-39.9, isolated (see actual BMI) 11/08/2017  . CKD (chronic kidney disease), stage III 11/08/2017  . Anemia 11/07/2017  . Thrombocytosis ([place]) 11/07/2017  . Lymphedema 11/07/2017  . Routine general medical examination at a health care facility 05/10/2014  . Paroxysmal SVT (supraventricular tachycardia) ([place]) 11/28/2013  . Physical deconditioning 11/23/2013  . Generalized weakness 11/17/2013  . Nausea 11/17/2013  . Multilevel spine pain 11/17/2013  . Aortic stenosis, mild 11/17/2013  . DM type 2 (diabetes mellitus, type 2) ([place]) 02/02/2013  . Arthritis 10/19/2012  . Mixed incontinence 10/19/2012  . GERD  (gastroesophageal reflux disease) 04/20/2011  . HTN (hypertension) 01/20/2011  . Morbid obesity ([place])     Ander Purpura, PT 04/27/2019, 3:20 PM  [place], [place], [address] Phone: [phone]   Fax:  [phone]  Name: [name] [name] MRN: [id number] Date of Birth: [birth year]

## 2019-05-02 ENCOUNTER — Encounter (HOSPITAL_BASED_OUTPATIENT_CLINIC_OR_DEPARTMENT_OTHER): Payer: PPO | Admitting: Internal Medicine

## 2019-05-02 ENCOUNTER — Ambulatory Visit: Payer: PPO

## 2019-05-02 ENCOUNTER — Other Ambulatory Visit: Payer: Self-pay

## 2019-05-02 DIAGNOSIS — I890 Lymphedema, not elsewhere classified: Secondary | ICD-10-CM

## 2019-05-02 DIAGNOSIS — L97221 Non-pressure chronic ulcer of left calf limited to breakdown of skin: Secondary | ICD-10-CM | POA: Diagnosis not present

## 2019-05-02 DIAGNOSIS — L97821 Non-pressure chronic ulcer of other part of left lower leg limited to breakdown of skin: Secondary | ICD-10-CM | POA: Diagnosis not present

## 2019-05-02 DIAGNOSIS — R2689 Other abnormalities of gait and mobility: Secondary | ICD-10-CM

## 2019-05-02 DIAGNOSIS — L97219 Non-pressure chronic ulcer of right calf with unspecified severity: Secondary | ICD-10-CM | POA: Diagnosis not present

## 2019-05-02 NOTE — Therapy (Signed)
[place], [place], [address] Phone: [phone]   Fax:  [phone]  Physical Therapy Treatment  Patient Details  Name: [name] [name] MRN: [id number] Date of Birth: [birth year] Referring Provider (PT): Darci Current   Encounter Date: 05/02/2019  PT End of Session - 05/02/19 1407    Visit Number  17    Number of Visits  25    Date for PT Re-Evaluation  05/09/19    PT Start Time  [id number]    PT Stop Time  1455    PT Time Calculation (min)  38 min    Activity Tolerance  Patient tolerated treatment well    Behavior During Therapy  [facility] for tasks assessed/performed       Past Medical History:  Diagnosis Date  . Acute kidney injury ([place])   . Aortic stenosis, mild 11/17/2013  . Arthritis    "both knees, spine, back, fingers" (11/29/2013)  . CHF (congestive heart failure) ([place])   . Edema   . GERD (gastroesophageal reflux disease)   . Heart murmur   . History of diastolic dysfunction    Echo [id number] with diastolic dysfunction  . HTN (hypertension)   . Morbid obesity ([place])   . OA (osteoarthritis)   . PAC (premature atrial contraction)    per prior Holter  . Palpitations   . Pneumonia    "as a child"  . PVC's (premature ventricular contractions)    per prior Holter  . SVT (supraventricular tachycardia) ([place])   . Tachycardia    noted at 07/28/11 visit. Started on beta blocker. Possible atrial flutter vs long PR tachycardia/AVNRT  . Type II diabetes mellitus ([place])     Past Surgical History:  Procedure Laterality Date  . CESAREAN SECTION  1985  . CESAREAN SECTION  1985  . SUPRAVENTRICULAR TACHYCARDIA ABLATION  11/29/2013  . SUPRAVENTRICULAR TACHYCARDIA ABLATION N/A 11/29/2013   Procedure: SUPRAVENTRICULAR TACHYCARDIA ABLATION;  Surgeon: Evans Lance, MD;  Location: [facility] CATH LAB;  Service: Cardiovascular;  Laterality: N/A;  . US ECHOCARDIOGRAPHY  07/25/2008   EF 55-60%  . VAGINAL DELIVERY       There were no vitals filed for this visit.  Subjective Assessment - 05/02/19 1504    Subjective  Pt states that she did the pump 1x. She reports that she tried to take her wrap off before coming to physical therapy today but had difficulty.    Pertinent History  osteo arthritis, hx cellulitis, phlebitis and history of edema in BLE, Hx Acute kidney injury, CHF and DM II    Patient Stated Goals  I want to get this swelling down and heal my wound.    Currently in Pain?  Yes    Pain Score  7     Pain Location  Knee    Pain Orientation  Left;Right    Pain Descriptors / Indicators  Aching    Pain Type  Chronic pain    Pain Onset  More than a month ago    Pain Frequency  Intermittent    Aggravating Factors   movement    Pain Relieving Factors  rest                       [facility] Adult PT Treatment/Exercise - 05/02/19 0001      Manual Therapy   Manual Therapy  Edema management;Compression Bandaging    Edema Management  discussed how to remove wraps adequatley to reduce risk of tourniquet  or using scissors and accidentally cutting herself due to pt stating she had difficulty removing the wrap .    Compression Bandaging  Pt wrapped this session using tubigrip as base layer with 3 artiflex for shaping. 2 8 cm for roman sandal and ankle/sole/heel, 3 10 cm for spiral wrap and 2 12 cm for thigh. Ironing throughout to prevent wrinkles no significant pull this session due to pt had good reduction w/o much pressure last session.               PT Education - 05/02/19 1507    Education Details  Pt will try to pump atleast 1x every day. Pt will make sure to assess where the last wrap ends and will take off one wrap at a time instead of trying to scoot them off of her leg.    Person(s) Educated  Patient    Methods  Explanation    Comprehension  Verbalized understanding       PT Short Term Goals - 04/04/19 1515      PT SHORT TERM GOAL #1   Title  Pt will be independent with  HEP    Baseline  Pt is working on her HEP occasionally at home.    Time  5    Period  Weeks    Status  On-going        PT Long Term Goals - 04/04/19 1515      PT LONG TERM GOAL #1   Title  Patient will have reduction of L lower leg limb girth by 3 cm    Baseline  Pt will be measuremed next session she has significantly reduced from her initial evaluation.    Time  5    Status  On-going    Target Date  05/23/19      PT LONG TERM GOAL #2   Title  Patient to be properly fitted with compression garment to wear on daily basis.    Baseline  Pt has a juxtalite that is too small and old that ht evelcro is no longer usable on. Trying to get her a [employer].    Time  5    Period  Weeks    Status  On-going    Target Date  05/23/19      PT LONG TERM GOAL #3   Title  Patient will be independent in self-care management principles including self-massage/bandaging and long term management plan for edema.    Baseline  Pt has difficulty with managing her lymphedema. Trying to ger her a pump to help with swelling.    Time  5    Period  Weeks    Status  On-going    Target Date  05/23/19            Plan - 05/02/19 1407    Clinical Impression Statement  Wound center had dressed pt wound prior to physical therapy today. Small ABD pad was placed under the 2nd toe in order to decrease risk for skin integrity issues due to pt was reporting that this was tender. Physical therapist checked the area with some rubor noted but no skin break down. Pt continues with significant improvement with edema in her LLE since her last session. The pannus over th emedial L distal thigh has continued to soften with continued fibrotic area in the posterior proximal thigh due to difficulty wrapping secondar to pt needs to be in a sitting position and is unable to tolerate standing with wrapping. Pt  was encouraged to continue using her vasopneumatic pump. Pt will benefit from continued POC at this time until she is  able to manage at home with the correct equipment.    Personal Factors and Comorbidities  Age;Comorbidity 3+    Comorbidities  CHF, acute kidney injury, DM II    Rehab Potential  Good    PT Frequency  2x / week    PT Duration  12 weeks    PT Treatment/Interventions  Electrical Stimulation;Cryotherapy;Gait training;Stair training;Functional mobility training;Therapeutic activities;Therapeutic exercise;Balance training;Neuromuscular re-education;Patient/family education;Manual lymph drainage;Compression bandaging;Passive range of motion;Taping;Vasopneumatic Device    PT Next Visit Plan  ask if she has used pump 2x/day over the weekend, Assess wrap, give exercises, assess self MLD and possible work out pants?    PT Home Exercise Plan  ask about pump, provide exercises    Consulted and Agree with Plan of Care  Patient       Patient will benefit from skilled therapeutic intervention in order to improve the following deficits and impairments:  Difficulty walking, Increased edema  Visit Diagnosis: Lymphedema  Other abnormalities of gait and mobility     Problem List Patient Active Problem List   Diagnosis Date Noted  . Postnasal drip 11/16/2017  . Sensorineural hearing loss (SNHL), bilateral 11/16/2017  . Tinnitus of both ears 11/16/2017  . Obesity, Class II, BMI 35-39.9, isolated (see actual BMI) 11/08/2017  . CKD (chronic kidney disease), stage III 11/08/2017  . Anemia 11/07/2017  . Thrombocytosis ([place]) 11/07/2017  . Lymphedema 11/07/2017  . Routine general medical examination at a health care facility 05/10/2014  . Paroxysmal SVT (supraventricular tachycardia) ([place]) 11/28/2013  . Physical deconditioning 11/23/2013  . Generalized weakness 11/17/2013  . Nausea 11/17/2013  . Multilevel spine pain 11/17/2013  . Aortic stenosis, mild 11/17/2013  . DM type 2 (diabetes mellitus, type 2) ([place]) 02/02/2013  . Arthritis 10/19/2012  . Mixed incontinence 10/19/2012  . GERD  (gastroesophageal reflux disease) 04/20/2011  . HTN (hypertension) 01/20/2011  . Morbid obesity [facility])     Ander Purpura, PT 05/02/2019, 3:13 PM  [place], [place], [address] Phone: [phone]   Fax:  [phone]  Name: [name] [name] MRN: [id number] Date of Birth: [birth year]

## 2019-05-03 NOTE — Progress Notes (Signed)
SITLALY, PANZER (LF:[id number]) Visit Report for 05/02/2019 HPI Details Patient Name: Date of Service: CARRYN, NISSEN 05/02/2019 1:15 PM Medical Record [id number] Patient Account Number: [id number] Date of Birth/Sex: Treating RN: 01/11/1946 (74 y.o. F) Primary Care Provider: [name] Other Clinician: Referring Provider: Treating Provider/Extender:Robson, Tenna Child, [place] Siren in Treatment: 16 History of Present Illness HPI Description: 04/09/15; the patient is here for review of wounds on her bilateral lower extremities for the last month. The patient has a history of lymphedema and bilateral chronic venous insufficiency with inflammation she was here for review of wounds on her bilateral legs in 2014 she was discharged home with external compression pumps which she is currently not using. Over the last month she developed wounds on her bilateral lower legs with drainage and pain and she is here for our review of this. 04/16/15; the patient's edema is under much better control. She has 3 areas one on the right anterior medial leg one on the left posterior leg and a small open area with purulent drainage on the left anterior leg. I have cultured the area on the left anterior leg 04/23/15; continued improvement in the patient's edema. All her wounds of closed I see no open areas. Culture from the left anterior leg last time was negative. READMISSION 02/07/16; this patient is a woman that I saw her earlier in 2017. I believe we discharged her very quickly with bilateral wounds in her legs secondary to chronic venous hypertension with inflammation and secondary lymphedema. She had juxtalite stockings. She has external compression pumps at home from a stay in our clinic in 2014. She tells me that she's had wounds on her bilateral lower legs since March or April of this year. 3 wounds on the left 2 on the right. She is a type II diabetic and has a history of  noncompressible vessels bilaterally although she is tolerated 4-layer compression. The last note from her primary physician Dr. [name] was in May. At that point Dr. [name] had referred the patient here for lymphedema management. The patient stated she made a phone call to year but was not able to arrange an appointment. It is not clear that she is actually using anything on the wounds currently except some gauze that was saturated with drainage per our intake nurse. She is certainly not using her juxtalite stockings and by the patient's own admission she is not using her compression pumps because they are "irritating". I don't see a recent hemoglobin A1c. The patient states she has not been systemically unwell but the legs are painful 02/14/16; patient tolerated her Profore wraps reasonably well. She is using the compression pumps 1 time per day. Measurements of her legs are better in terms of the amount of lymphedema 02/21/16; patient tolerated her Profore wraps it. She says she is using her compression pumps one or 2 times a day. Measurements of her leg circumference are improved and most of her wound dimensions are better, unfortunately the home health that we referred her to did not except her insurance but works successful in getting her accompanied that would except her insurance however she apparently has a $25 co-pay which she cannot afford as she is on a fixed income 03/05/16; patient states she did not back using the pumps. She is healed that 2 wounds on the left leg. She still has areas on the right lateral lower leg, right medial lower leg and left lateral lower leg. All of these look some better and have reduced in  area 03/13/16; still 3 wounds 1 on the left lateral leg, one on the left medial/posterior leg which is really the most extensive and a small area on the right lateral leg. All of this in the setting of severe chronic lymphedema and chronic  venous inflammation. Damage to the skin with cobblestone thickened skin. 03/20/16; still 2 areas. The area on the left lateral leg appears to of closed over. The right medial leg is still most extensive wound although it appears to be closing over right lateral only a small open area remains. Using silver alginate Profore wraps 03/27/16; the area on the left leg remains closed. The edema control is better on the left than the right. The right medial leg is still weeping but everything appears to be epithelialized. The lateral aspect of the right leg appears closed. She tells me she has old juxtalite stockings. She has been compliant with her compression pumps. 04/03/16; the area on the left leg remains closed. She has ordered new juxtalite stockings. The right posterior medial wound is fully epithelialized. However she has an irritated area over the right Achilles that she states is been itching all week under the wraps.. She has been compliant with her compression pumps 04/10/16; the area on the left leg remains closed. The right posterior medial wound is also closed today. The irritated area over the right Achilles which was probably wrap injury is also fully epithelialized and closed. The patient has not received her new juxtalite stockings, she arrives in clinic today with an old juxtalite stocking in place. Sheis using her compression pumps secondary to her severe stage III lymphedema READMISSION 01/21/17; this is a patient we know from several previous stays in this clinic. She has lymphedema with chronic venous insufficiency and inflammation. The last time she was here we discharged her with bilateral juxta lites which she is compliant with. She also has external compression pumps at home from stays in our clinic in 2014 although she tells me that she is not very compliant with these as when she uses them a causes pain in her bilateral knees the patient states that her wounds on her left  greater than right leg including 2 large areas on the left anterior and left lateral leg and an area on the right leg open in late June or July since then she is been followed by [facility] and she's had bilateral Unna boot supplied every week for the last month.. I think the wounds have actually improved although they are not specifically dressed. She states she had an ultrasound to rule out DVT bilaterally that was negative. She does not have arterial studies although she is a diabetic. She states she has a lot of pain in her legs she states she does not use her external compression pumps because it causes pain in her knees. As usual her ABIs were noncompressible bilaterally in this clinic 01/28/17; currently the patient only has one small wound on the left lateral leg and I think this should be healed next week. We are going to try to get her bilateral extremitease stockings. She tells me that compression pumps heard her posterior rib arthritic knees so she is not been compliant with this 02/04/17; the patient's wounds are totally healed. She has chronic venous insufficiency and secondary lymphedema. She has new extremitease stockings. She also has compression pumps Readmission: 04/21/17 on evaluation today patient appears to be doing about the same as what she was previous when we were evaluating her back  in November 2018. At that point she had completely healed. With that being said she has now reopened and states that she wasn't aware that she was supposed to come back. 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 Subsequently she also states that although she has the compression that we got for her previously the extremitease stockings she nonetheless doesn't always wear these and being noncompliant often has things will reopen as far as ulcers on her lower extremities. No fevers,  chills, nausea, or vomiting noted at this time. Patient has no evidence of dementia. She is having some discomfort in regard to the ulcers at this point. 04/28/17 on evaluation today patient appears to be doing significantly better in regard to her wounds. With that being said she is still having significant discomfort and is not really keen on sharp debridement. She does not appear to have any evidence of infection which is good news I do think the Iodoflex is beneficial for her. Of note her ABI's were found in the system to be on the right 1.21 with a TBI of 0.76 and on the left 1.35 with a TBI of 0.75. In general her swelling appears to be significantly improved however. No fevers, chills, nausea, or vomiting noted at this time. Patient has been tolerating the wraps without complication. She has no evidence of dementia. 05/05/17-she is here in follow-up evaluation for bilateral lower extremity venous and lymphedema ulcers. She states she has not been using her lymphedema pumps secondary to pain at her knees. After discussion it was noted that she uses knee-high lymphedema pumps but does have a pair of thigh-high lymphedema pumps. She was strongly advised to use the thigh-high lymphedema pumps and if necessary reduce the setting for improved tolerance. She reports no change in odor and/or drainage. She continues to tolerate sharp debridement poorly with significant pain, primarily to the right lower extremity. We will continue with current treatment plan, along with improved lymphedema pump use and follow-up next week 05/12/17 on evaluation today patient actually appears to be doing much better in regard to her bilateral lower extremity ulcers. Only the right altar actually requires debridement today. The other two cleaned off nicely in two of the three in fact on the left lower extremity or actually almost completely healed. She does have some weeping noted still the bilateral lower extremities left  greater than right. She still is not able to use her compression pumps even though I have mentioned this several times to her as did Leah last week. No fevers, chills, nausea, or vomiting noted at this time. Patient has been tolerating the dressing changes without complication. Patient has no evidence of dementia 05/19/17 on evaluation today patient appears to be doing very well regard to her bilateral lower extremity ulcers. In fact she seems to be improving and in fact healed in a couple of locations that were giving her trouble last week. Overall I'm extremely pleased with how things have progressed. She still has some discomfort especially in the right lateral lower extremity. Fortunately there does not appear to be any evidence of significant infection which is good news she does tell me that she has used her compression pumps several times although not routinely over the past week I did praise her for this. I do think it will be beneficial and that might even be what's helping with her wounds as far as healing  is concerned at least to a degree. No fevers, chills, nausea, or vomiting noted at this time. Patient has no dementia and no evidence of infection. 05/26/17 on evaluation today patient presents for evaluation concerning her bilateral lower extremity ulcer secondary to lymphedema. Fortunately she appears to be doing very well at this point in time. Specifically her right leg appears to be completely closed and healed. The left leg though there is still a small area of opening overall has healed. She does have a little bit of excoriation at the top or the wraps are because they slid down on her. Otherwise there's no evidence of infection and the other has improved dramatically now that she is not draining as significantly. Patient is having no significant pain she tells me she has been able to use her lymphedema pumps one time a day although that's not all she can tolerate. Even that is  difficult for her. 06/02/17 on evaluation today patient's ulcers appear to be completely closed at this point. She does still note some odor but I believe this is due to the fact she has not been able to wash her legs as well currently. Fortunately she did bring her compression with her today. She does have the EXTREMIT-EASE Compression. With that being said she is somewhat nervous about going back to this as previously she broke out yet again once we discontinue wrapping her. READMISSION 11/23/17; patient is now 74 year old diabetic woman with severe chronic venous insufficiency with lymphedema. We care for her last in this clinic in February 2019 at which time she had chronic venous insufficiency with lymphedema bilateral lower extremity ulcers. We discharged her with bilateral wrap around/extremitease stockings and compression pumps. Is fairly clear she is not using the compression pumps stating it causes pain in her knees. In any case she was admitted to hospital from 8/3 through 8/6 with a several week history of recurrent bilateral ulcers. She was felt to have bilateral cellulitis treated with doxycycline IV. She is still on oral doxycycline and has another 2 days worth. At discharge her white count was 14.1 hemoglobin at 8.9. The patient is a diabetic however her last ABIs in 2018 showed a right ABI of 1.21, left of 1.35. TBI's were 0.76 on the right 0.75 on the left. Posterior tibial artery was monophasic on the right triphasic on the left. Dorsalis pedis triphasic on the right and triphasic on the left. She is not felt to have significant macrovascular disease 12/02/17;significant bilateral lower extremity wounds secondary to severe uncontrolled lymphedema. She did not get home health out to change the wraps. So she kept this on all week. She is telling me she using his her compression pumps once a week. In general all the wounds look better. We've been using silver alginate with secondary  absorptive dressings 4 layer compression 12/10/17; bilateral lower extremity wounds secondary to uncontrolled lymphedema. She tells me she is using her compression pumps on most days. We've been keeping wraps on all week. Using silver alginate. She has wounds on the right lateral calf left medial and left lateral calf. All of this is somewhat better 12/16/17; the patient has 3 small open areas. One on the right lateral calf, one on the left medial calf and the small area on the left lateral calf. She has lymphedema, chronic venous insufficiency with hemosiderin deposition. Very fibrotic distal lower extremity scan with suggestion of an inverted bottle sign appearance to her legs. She has compression pumps but she does not use  them has not used them in the last week. Currently we have her in 4 layer compression, silver alginate to the wounds and this is being changed once by home health. She is noncompliant with the compression pumps because she says it hurts her knees. She also has compression stockings ando Juxta light stockings at home 12/24/17; the patient's right lateral calf is healed as well as the left medial. There is no open wound on the right leg. She still has 2 areas on the left lateral calf. We transitioned the right leg into an extremitease stocking which she brought in from home. 12/31/17; the patient has a superficial wound on the left lateral lower calf. We've been wrapping this leg. She arrives today with the right leg swollen but without a wound. She cannot get her extremities stocking on the right leg. There is a lot more edema here. She is not using her compression pumps 01/10/2018; patient has a same superficial wound on the left lateral calf. Her right leg has less peripheral edema. She tells me she also started herself on some Lasix that had been previously prescribed for her at some point but she had never taken it. She is also concerned about whether her compression pumps are  leaking. She has extremitease stockings however the edema gets bad enough she can get these on. Currently she has no open wound on the right leg and is mention the edema is actually better than when I saw this 10 days ago 01/17/2018; patient has a same superficial area on the posterior left calf and a weeping area in the mid part of the right lateral calf. She still does not have anyone from [facility] coming to see her compression pumps which she describes as being suspicious for a leakage. I am not really sure how frequently she is using this in any case. She has massive bilateral calf lymphedema. I think there is some spreading lymphedema into her posterior thighs. 01/24/2018; the patient's edema in her legs is actually some better. She still has a smaller area on the left posterior calf although this is better. The weeping area on the right lateral calf is also better. As it turned out she did not get her pumps from [facility] but [facility] and they are going to try to go out and see these this week which is going to help. I explained to her that she will use the compression pumps once a day while we are wrapping her legs but when she transitions to stockings that may be she will require pumping twice a day. Her compliance in this area has not been high and I wonder about her ability to do this herself. 02/01/2018; the area on the posterior left leg is healed. She still has the original wound on the right lateral calf which is epithelialized. The however just above this there is still weeping lymphedema fluid. She has a new area on the upper right calf which is either wrap injury or is she points out where she has been scratching under the wraps. We still not have managed to get a hold of a representative of [facility] to see if the pump is operational. She has extremitease stockings at home however I am not of the opinion that this will maintain her skin  integrity without the compression pumps. 02/08/2018; posterior left leg remains healed although she has a new small open area on the left lateral calf. Right lateral calf has two quarter sized open  areas and close juxtaposition. There is less weeping edema fluid. She apparently managed to contact [facility]. They are sending her a part. But she is still not received it 02/15/2018; the patient has no open wound on either leg. Some scale present on the right upper lateral calf just below the fibular head. Edema control is excellent. She did not bring her stockings or her juxta lite wrap around. She has the part for her compression pump but she has not use the compression pumps yet READMISSION 01/10/2019 Patient is now a 75 year old woman that we have had in the clinic multiple times in the past. She has chronic lower extremity lymphedema, recurrent wounds on her bilateral lower extremities. She is also a type II diabetic reasonably well controlled with a recent hemoglobin A1c while she was in the hospital of 5.9. She has wounds on her bilateral lower extremities mostly on the left lateral and right lateral. These were noted also during her recent hospitalization. The patient states they have been there for a while. She has not been able to wear her stockings he has trouble getting them on as she lives alone. She also has external compression pumps but uses them sparingly because it causes pain in her knees which hurt because of osteoarthritis. The patient was on doxycycline before she went into the hospital apparently for fear of infection in her legs and is on Keflex coming out because of a UTI The patient was recently in the hospital from 01/06/2019 through 01/09/2019. She was admitted with nausea and vomiting and prerenal azotemia. This was corrected with IV fluid her Lasix has been put on hold. Hemoglobin A1c at 5.9 her metformin was discontinued. She was also felt to have a UTI she was  prescribed Keflex at 503 times a day she is picking that up today. The patient is not felt to have an arterial issue. ABIs in our clinic were 1.24 on the right and 1.06 on the left. She had formal studies in 2018 at which time her ABIs were 1.21 on the right TBI of 0.76 and on the left 1.35 and 0.75 respectively. Waveforms are on the right were monophasic and biphasic, triphasic and biphasic on the left. She is tolerated 4 layer compression in the past 10/13. Comes in today having not used her compression pumps /perhaps once in the last week. She has too much edema to consider healing these wounds. We have been using silver alginate 10/20; she tells me he had she used her compression pumps once a day as we asked. Clearly she has edema out of her legs although most of it seems to be settling around her knees which is what she complains about. We have been using silver alginate to the extensive de-epithelialized area on the left lateral calf and a much smaller area on the right lateral extending linearly into the right posterior 10/27; she is using her compression pumps daily. Her edema control is better. We have been using silver alginate to the extensive wound areas on her bilateral lower legs. This includes left anterior left lateral and left posterior. Right anterior right lateral and right posterior. The wounds are not all in the same condition. She has severe bilateral skin damage with the epithelialization, flaking dried epithelium 11/3; she is using her compression pumps 1 times daily. Pretty obvious that her wraps fell down especially on the left leg to mid calf. I think things are improving on the right lateral calf, I do not see anything open  posteriorly. The major areas on the left lateral where she has 3 or 4 deeper wounds in the midst of an entire de-epithelialized area. There is erythema here but no tenderness I think this represents venous stasis rather than cellulitis. I debrided  the areas on the left lateral calf last week but once again they have tightly adherent debris over the top of them which is disappointing 11/10; the patient's major areas on the left lateral calf to 3 wounds with some depth surrounded by a large area of de-epithelialized tissue. There is no infection. We have been using silver alginate under 4-layer compression. On the right lateral she has a more contained wound area that is superficial. Been using silver alginate under 4-layer compression 11/17; the patient's major wound is on the left lateral calf. We have been using silver alginate under compression. There is no open wound per se on the right but it de-epithelialized area on the lateral calf that is weeping edema fluid. I do not see much change here today. 12/1; patient arrives in clinic with her wounds looking better. Her edema control is better. The lymphedema specialist working on the left leg we have been working on the right. We have been using 4-layer compression. The patient is using a consider external compression pumps and Hydrofera Blue to the open areas. She is being measured for an abdominal compression pump and apparently that is going to go through her insurance 12/15; continued improvement. 2 small areas on the left and slightly larger 2 small areas on the right. 12/22; patient still has open areas on the right lateral calf which are superficial and the new one on the right anterior calf. She did not use her compression pumps this week. Her left leg is wrapped by our lymphedema specialist. There are no open wounds here 12/29; she still has a superficial area on the right lateral calf and area on the right medial calf. Right anterior wound area appears to have closed. She has much better edema control today than last week 04/11/2019; the patient is here with severe bilateral lymphedema, chronic venous insufficiency with marked skin changes in her lower extremities. We are  wrapping her right leg and her lymphedema specialist is putting lymphedema wraps on the left. She states she is using her compression pumps although in the past her compliance with uses been limited because of osteoarthritic pain in her knees. She only has one remaining area on the right leg however she has wrap injuries on the left at the upper level what where I think her wraps were cause. the left lateral area left posterior. These would be new this week 1/12; generally better. The wrap injuries in the upper calf on the left from last week are down to a single wound. She has 2 wounds anteriorly that are still weeping fluid although these appear to have been epithelialized surface for the most part. There is no open wounds on the right leg we are wrapping her right leg and 4-layer compression and we are going a lymphedema wrap on the left leg. The the approach is a [place] 2000 wrap on the left leg and then subsequently the right leg. She claims to be compliant with her compression pumps 1/19; the patient has 1 tiny open area on the right medial calf. The rest of this is not open. She however has a large erythematous denuded area on the left anterior tibia extending laterally. There is no evidence of infection here. She has her new  current waist high compression pumps 1/26; the patient has no open wounds on the right. On the left anteriorly much better than last week still a smattering of small weeping open areas. There is no evidence of infection Electronic Signature(s) Signed: 05/02/2019 5:32:11 PM By: Linton Ham MD Entered By: Linton Ham on 05/02/2019 15:05:27 -------------------------------------------------------------------------------- Physical Exam Details Patient Name: Date of Service: ARDIE, MAZZOTTI 05/02/2019 1:15 PM Medical Record [id number] Patient Account Number: [id number] Date of Birth/Sex: Treating RN: Mar 05, 1946 (74 y.o. F) Primary Care Provider: [name] Other Clinician: Referring Provider: Treating Provider/Extender:Robson, Tenna Child, [place] Siren in Treatment: 52 Constitutional Patient is hypertensive.. Pulse regular and within target range for patient.Marland Kitchen Respirations regular, non-labored and within target range.. Temperature is normal and within the target range for the patient.Marland Kitchen Appears in no distress. Cardiovascular Pedal pulses are palpable. Edema control is marginal [nonpitting] but actually fairly good for this patient. Notes Wound exam; the patient has nothing open on the right leg. The large denuded area last week has mostly healed on the left anteriorly and laterally however there is small superficial weeping areas that are still open. Electronic Signature(s) Signed: 05/02/2019 5:32:11 PM By: Linton Ham MD Entered By: Linton Ham on 05/02/2019 15:07:27 -------------------------------------------------------------------------------- Physician Orders Details Patient Name: Date of Service: YULMA, POSTLETHWAITE 05/02/2019 1:15 PM Medical Record [id number] Patient Account Number: [id number] Date of Birth/Sex: Treating RN: Mar 13, 1946 (74 y.o. [name] Primary Care Provider: [name] Other Clinician: Referring Provider: Treating Provider/Extender:Robson, Tenna Child, [place] Siren in Treatment: [phone] Verbal / Phone Orders: No Diagnosis Coding ICD-10 Coding Code Description I89.0 Lymphedema, not elsewhere classified L97.221 Non-pressure chronic ulcer of left calf limited to breakdown of skin L97.211 Non-pressure chronic ulcer of right calf limited to breakdown of skin E11.622 Type 2 diabetes mellitus with other skin ulcer I87.323 Chronic venous hypertension (idiopathic) with inflammation of bilateral lower extremity Follow-up Appointments Return Appointment in 1 week. Nurse Visit: - Thursday for Lymphedema Dressing Change Frequency Wound #39 Left,Anterior Lower Leg Do not  change entire dressing for one week. Skin Barriers/Peri-Wound Care Barrier cream Moisturizing lotion TCA Cream or Ointment Wound Cleansing May shower and wash wound with soap and water. Primary Wound Dressing Wound #39 Left,Anterior Lower Leg Calcium Alginate with Silver Wound #40 Right,Posterior Lower Leg Calcium Alginate with Silver Secondary Dressing Wound #39 Left,Anterior Lower Leg Kerlix/Rolled Gauze - PT to apply lymphedema wrap over dressing ABD pad Wound #40 Right,Posterior Lower Leg Dry Gauze ABD pad Edema Control 4 layer compression - Right Lower Extremity - unna boot at top to help secure wrap Other: - left leg: PT to apply lymphedema wraps Electronic Signature(s) Signed: 05/02/2019 5:32:11 PM By: Linton Ham MD Signed: 05/03/2019 7:19:07 AM By: Carlene Coria RN Entered By: Carlene Coria on 05/02/2019 13:37:06 -------------------------------------------------------------------------------- Problem List Details Patient Name: Date of Service: ALEEZAY, MELCHERT 05/02/2019 1:15 PM Medical Record [id number] Patient Account Number: [id number] Date of Birth/Sex: Treating RN: January 07, 1946 (74 y.o. [name] Primary Care Provider: [name] Other Clinician: Referring Provider: Treating Provider/Extender:Robson, Tenna Child, [place] Siren in Treatment: 16 Active Problems ICD-10 Evaluated Encounter Code Description Active Date Today Diagnosis I89.0 Lymphedema, not elsewhere classified 01/10/2019 No Yes L97.221 Non-pressure chronic ulcer of left calf limited to 01/10/2019 No Yes breakdown of skin L97.211 Non-pressure chronic ulcer of right calf limited to 01/10/2019 No Yes breakdown of skin E11.622 Type 2 diabetes mellitus with other skin ulcer 01/10/2019 No Yes I87.323 Chronic venous hypertension (idiopathic) with 04/25/2019 No Yes inflammation of bilateral lower extremity Inactive Problems  Resolved Problems Electronic Signature(s) Signed:  05/02/2019 5:32:11 PM By: Linton Ham MD Entered By: Linton Ham on 05/02/2019 15:03:58 -------------------------------------------------------------------------------- Progress Note Details Patient Name: Date of Service: SINAI, HAMIDEH 05/02/2019 1:15 PM Medical Record [id number] Patient Account Number: [id number] Date of Birth/Sex: Treating RN: 27-Dec-1945 (74 y.o. F) Primary Care Provider: [name] Other Clinician: Referring Provider: Treating Provider/Extender:Robson, Tenna Child, [place] Siren in Treatment: 16 Subjective History of Present Illness (HPI) 04/09/15; the patient is here for review of wounds on her bilateral lower extremities for the last month. The patient has a history of lymphedema and bilateral chronic venous insufficiency with inflammation she was here for review of wounds on her bilateral legs in 2014 she was discharged home with external compression pumps which she is currently not using. Over the last month she developed wounds on her bilateral lower legs with drainage and pain and she is here for our review of this. 04/16/15; the patient's edema is under much better control. She has 3 areas one on the right anterior medial leg one on the left posterior leg and a small open area with purulent drainage on the left anterior leg. I have cultured the area on the left anterior leg 04/23/15; continued improvement in the patient's edema. All her wounds of closed I see no open areas. Culture from the left anterior leg last time was negative. READMISSION 02/07/16; this patient is a woman that I saw her earlier in 2017. I believe we discharged her very quickly with bilateral wounds in her legs secondary to chronic venous hypertension with inflammation and secondary lymphedema. She had juxtalite stockings. She has external compression pumps at home from a stay in our clinic in 2014. She tells me that she's had wounds on her bilateral lower legs  since March or April of this year. 3 wounds on the left 2 on the right. She is a type II diabetic and has a history of noncompressible vessels bilaterally although she is tolerated 4-layer compression. The last note from her primary physician Dr. [name] was in May. At that point Dr. [name] had referred the patient here for lymphedema management. The patient stated she made a phone call to year but was not able to arrange an appointment. It is not clear that she is actually using anything on the wounds currently except some gauze that was saturated with drainage per our intake nurse. She is certainly not using her juxtalite stockings and by the patient's own admission she is not using her compression pumps because they are "irritating". I don't see a recent hemoglobin A1c. The patient states she has not been systemically unwell but the legs are painful 02/14/16; patient tolerated her Profore wraps reasonably well. She is using the compression pumps 1 time per day. Measurements of her legs are better in terms of the amount of lymphedema 02/21/16; patient tolerated her Profore wraps it. She says she is using her compression pumps one or 2 times a day. Measurements of her leg circumference are improved and most of her wound dimensions are better, unfortunately the home health that we referred her to did not except her insurance but works successful in getting her accompanied that would except her insurance however she apparently has a $25 co-pay which she cannot afford as she is on a fixed income 03/05/16; patient states she did not back using the pumps. She is healed that 2 wounds on the left leg. She still has areas on the right lateral lower leg, right medial lower leg  and left lateral lower leg. All of these look some better and have reduced in area 03/13/16; still 3 wounds 1 on the left lateral leg, one on the left medial/posterior leg which is really the most extensive and a small  area on the right lateral leg. All of this in the setting of severe chronic lymphedema and chronic venous inflammation. Damage to the skin with cobblestone thickened skin. 03/20/16; still 2 areas. The area on the left lateral leg appears to of closed over. The right medial leg is still most extensive wound although it appears to be closing over right lateral only a small open area remains. Using silver alginate Profore wraps 03/27/16; the area on the left leg remains closed. The edema control is better on the left than the right. The right medial leg is still weeping but everything appears to be epithelialized. The lateral aspect of the right leg appears closed. She tells me she has old juxtalite stockings. She has been compliant with her compression pumps. 04/03/16; the area on the left leg remains closed. She has ordered new juxtalite stockings. The right posterior medial wound is fully epithelialized. However she has an irritated area over the right Achilles that she states is been itching all week under the wraps.. She has been compliant with her compression pumps 04/10/16; the area on the left leg remains closed. The right posterior medial wound is also closed today. The irritated area over the right Achilles which was probably wrap injury is also fully epithelialized and closed. The patient has not received her new juxtalite stockings, she arrives in clinic today with an old juxtalite stocking in place. Sheis using her compression pumps secondary to her severe stage III lymphedema READMISSION 01/21/17; this is a patient we know from several previous stays in this clinic. She has lymphedema with chronic venous insufficiency and inflammation. The last time she was here we discharged her with bilateral juxta lites which she is compliant with. She also has external compression pumps at home from stays in our clinic in 2014 although she tells me that she is not very compliant with these as when she  uses them a causes pain in her bilateral knees the patient states that her wounds on her left greater than right leg including 2 large areas on the left anterior and left lateral leg and an area on the right leg open in late June or July since then she is been followed by [facility] and she's had bilateral Unna boot supplied every week for the last month.. I think the wounds have actually improved although they are not specifically dressed. She states she had an ultrasound to rule out DVT bilaterally that was negative. She does not have arterial studies although she is a diabetic. She states she has a lot of pain in her legs she states she does not use her external compression pumps because it causes pain in her knees. As usual her ABIs were noncompressible bilaterally in this clinic 01/28/17; currently the patient only has one small wound on the left lateral leg and I think this should be healed next week. We are going to try to get her bilateral extremitease stockings. She tells me that compression pumps heard her posterior rib arthritic knees so she is not been compliant with this 02/04/17; the patient's wounds are totally healed. She has chronic venous insufficiency and secondary lymphedema. She has new extremitease stockings. She also has compression pumps Readmission: 04/21/17 on evaluation today patient appears to be  doing about the same as what she was previous when we were evaluating her back in November 2018. At that point she had completely healed. With that being said she has now reopened and states that she wasn't aware that she was supposed to come back. 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 Subsequently she also states that although she has the compression that we got for her previously the extremitease stockings she nonetheless doesn't always wear these and being  noncompliant often has things will reopen as far as ulcers on her lower extremities. No fevers, chills, nausea, or vomiting noted at this time. Patient has no evidence of dementia. She is having some discomfort in regard to the ulcers at this point. 04/28/17 on evaluation today patient appears to be doing significantly better in regard to her wounds. With that being said she is still having significant discomfort and is not really keen on sharp debridement. She does not appear to have any evidence of infection which is good news I do think the Iodoflex is beneficial for her. Of note her ABI's were found in the system to be on the right 1.21 with a TBI of 0.76 and on the left 1.35 with a TBI of 0.75. In general her swelling appears to be significantly improved however. No fevers, chills, nausea, or vomiting noted at this time. Patient has been tolerating the wraps without complication. She has no evidence of dementia. 05/05/17-she is here in follow-up evaluation for bilateral lower extremity venous and lymphedema ulcers. She states she has not been using her lymphedema pumps secondary to pain at her knees. After discussion it was noted that she uses knee-high lymphedema pumps but does have a pair of thigh-high lymphedema pumps. She was strongly advised to use the thigh-high lymphedema pumps and if necessary reduce the setting for improved tolerance. She reports no change in odor and/or drainage. She continues to tolerate sharp debridement poorly with significant pain, primarily to the right lower extremity. We will continue with current treatment plan, along with improved lymphedema pump use and follow-up next week 05/12/17 on evaluation today patient actually appears to be doing much better in regard to her bilateral lower extremity ulcers. Only the right altar actually requires debridement today. The other two cleaned off nicely in two of the three in fact on the left lower extremity or actually  almost completely healed. She does have some weeping noted still the bilateral lower extremities left greater than right. She still is not able to use her compression pumps even though I have mentioned this several times to her as did Leah last week. No fevers, chills, nausea, or vomiting noted at this time. Patient has been tolerating the dressing changes without complication. Patient has no evidence of dementia 05/19/17 on evaluation today patient appears to be doing very well regard to her bilateral lower extremity ulcers. In fact she seems to be improving and in fact healed in a couple of locations that were giving her trouble last week. Overall I'm extremely pleased with how things have progressed. She still has some discomfort especially in the right lateral lower extremity. Fortunately there does not appear to be any evidence of significant infection which is good news she does tell me that she has used her compression pumps several times although not routinely over the past week I did praise her for this. I do think it will be  beneficial and that might even be what's helping with her wounds as far as healing is concerned at least to a degree. No fevers, chills, nausea, or vomiting noted at this time. Patient has no dementia and no evidence of infection. 05/26/17 on evaluation today patient presents for evaluation concerning her bilateral lower extremity ulcer secondary to lymphedema. Fortunately she appears to be doing very well at this point in time. Specifically her right leg appears to be completely closed and healed. The left leg though there is still a small area of opening overall has healed. She does have a little bit of excoriation at the top or the wraps are because they slid down on her. Otherwise there's no evidence of infection and the other has improved dramatically now that she is not draining as significantly. Patient is having no significant pain she tells me she has been able  to use her lymphedema pumps one time a day although that's not all she can tolerate. Even that is difficult for her. 06/02/17 on evaluation today patient's ulcers appear to be completely closed at this point. She does still note some odor but I believe this is due to the fact she has not been able to wash her legs as well currently. Fortunately she did bring her compression with her today. She does have the EXTREMIT-EASE Compression. With that being said she is somewhat nervous about going back to this as previously she broke out yet again once we discontinue wrapping her. READMISSION 11/23/17; patient is now 74 year old diabetic woman with severe chronic venous insufficiency with lymphedema. We care for her last in this clinic in February 2019 at which time she had chronic venous insufficiency with lymphedema bilateral lower extremity ulcers. We discharged her with bilateral wrap around/extremitease stockings and compression pumps. Is fairly clear she is not using the compression pumps stating it causes pain in her knees. In any case she was admitted to hospital from 8/3 through 8/6 with a several week history of recurrent bilateral ulcers. She was felt to have bilateral cellulitis treated with doxycycline IV. She is still on oral doxycycline and has another 2 days worth. At discharge her white count was 14.1 hemoglobin at 8.9. The patient is a diabetic however her last ABIs in 2018 showed a right ABI of 1.21, left of 1.35. TBI's were 0.76 on the right 0.75 on the left. Posterior tibial artery was monophasic on the right triphasic on the left. Dorsalis pedis triphasic on the right and triphasic on the left. She is not felt to have significant macrovascular disease 12/02/17;significant bilateral lower extremity wounds secondary to severe uncontrolled lymphedema. She did not get home health out to change the wraps. So she kept this on all week. She is telling me she using his her compression pumps  once a week. In general all the wounds look better. We've been using silver alginate with secondary absorptive dressings 4 layer compression 12/10/17; bilateral lower extremity wounds secondary to uncontrolled lymphedema. She tells me she is using her compression pumps on most days. We've been keeping wraps on all week. Using silver alginate. She has wounds on the right lateral calf left medial and left lateral calf. All of this is somewhat better 12/16/17; the patient has 3 small open areas. One on the right lateral calf, one on the left medial calf and the small area on the left lateral calf. She has lymphedema, chronic venous insufficiency with hemosiderin deposition. Very fibrotic distal lower extremity scan with suggestion of an inverted  bottle sign appearance to her legs. She has compression pumps but she does not use them has not used them in the last week. Currently we have her in 4 layer compression, silver alginate to the wounds and this is being changed once by home health. She is noncompliant with the compression pumps because she says it hurts her knees. She also has compression stockings ando Juxta light stockings at home 12/24/17; the patient's right lateral calf is healed as well as the left medial. There is no open wound on the right leg. She still has 2 areas on the left lateral calf. We transitioned the right leg into an extremitease stocking which she brought in from home. 12/31/17; the patient has a superficial wound on the left lateral lower calf. We've been wrapping this leg. She arrives today with the right leg swollen but without a wound. She cannot get her extremities stocking on the right leg. There is a lot more edema here. She is not using her compression pumps 01/10/2018; patient has a same superficial wound on the left lateral calf. Her right leg has less peripheral edema. She tells me she also started herself on some Lasix that had been previously prescribed for her at  some point but she had never taken it. She is also concerned about whether her compression pumps are leaking. She has extremitease stockings however the edema gets bad enough she can get these on. Currently she has no open wound on the right leg and is mention the edema is actually better than when I saw this 10 days ago 01/17/2018; patient has a same superficial area on the posterior left calf and a weeping area in the mid part of the right lateral calf. She still does not have anyone from [facility] coming to see her compression pumps which she describes as being suspicious for a leakage. I am not really sure how frequently she is using this in any case. She has massive bilateral calf lymphedema. I think there is some spreading lymphedema into her posterior thighs. 01/24/2018; the patient's edema in her legs is actually some better. She still has a smaller area on the left posterior calf although this is better. The weeping area on the right lateral calf is also better. As it turned out she did not get her pumps from [facility] but [facility] and they are going to try to go out and see these this week which is going to help. I explained to her that she will use the compression pumps once a day while we are wrapping her legs but when she transitions to stockings that may be she will require pumping twice a day. Her compliance in this area has not been high and I wonder about her ability to do this herself. 02/01/2018; the area on the posterior left leg is healed. She still has the original wound on the right lateral calf which is epithelialized. The however just above this there is still weeping lymphedema fluid. She has a new area on the upper right calf which is either wrap injury or is she points out where she has been scratching under the wraps. We still not have managed to get a hold of a representative of [facility] to see if the pump is operational. She has  extremitease stockings at home however I am not of the opinion that this will maintain her skin integrity without the compression pumps. 02/08/2018; posterior left leg remains healed although she has a new small  open area on the left lateral calf. Right lateral calf has two quarter sized open areas and close juxtaposition. There is less weeping edema fluid. She apparently managed to contact [facility]. They are sending her a part. But she is still not received it 02/15/2018; the patient has no open wound on either leg. Some scale present on the right upper lateral calf just below the fibular head. Edema control is excellent. She did not bring her stockings or her juxta lite wrap around. She has the part for her compression pump but she has not use the compression pumps yet READMISSION 01/10/2019 Patient is now a 74 year old woman that we have had in the clinic multiple times in the past. She has chronic lower extremity lymphedema, recurrent wounds on her bilateral lower extremities. She is also a type II diabetic reasonably well controlled with a recent hemoglobin A1c while she was in the hospital of 5.9. She has wounds on her bilateral lower extremities mostly on the left lateral and right lateral. These were noted also during her recent hospitalization. The patient states they have been there for a while. She has not been able to wear her stockings he has trouble getting them on as she lives alone. She also has external compression pumps but uses them sparingly because it causes pain in her knees which hurt because of osteoarthritis. The patient was on doxycycline before she went into the hospital apparently for fear of infection in her legs and is on Keflex coming out because of a UTI The patient was recently in the hospital from 01/06/2019 through 01/09/2019. She was admitted with nausea and vomiting and prerenal azotemia. This was corrected with IV fluid her Lasix has been put on hold.  Hemoglobin A1c at 5.9 her metformin was discontinued. She was also felt to have a UTI she was prescribed Keflex at 503 times a day she is picking that up today. The patient is not felt to have an arterial issue. ABIs in our clinic were 1.24 on the right and 1.06 on the left. She had formal studies in 2018 at which time her ABIs were 1.21 on the right TBI of 0.76 and on the left 1.35 and 0.75 respectively. Waveforms are on the right were monophasic and biphasic, triphasic and biphasic on the left. She is tolerated 4 layer compression in the past 10/13. Comes in today having not used her compression pumps /perhaps once in the last week. She has too much edema to consider healing these wounds. We have been using silver alginate 10/20; she tells me he had she used her compression pumps once a day as we asked. Clearly she has edema out of her legs although most of it seems to be settling around her knees which is what she complains about. We have been using silver alginate to the extensive de-epithelialized area on the left lateral calf and a much smaller area on the right lateral extending linearly into the right posterior 10/27; she is using her compression pumps daily. Her edema control is better. We have been using silver alginate to the extensive wound areas on her bilateral lower legs. This includes left anterior left lateral and left posterior. Right anterior right lateral and right posterior. The wounds are not all in the same condition. She has severe bilateral skin damage with the epithelialization, flaking dried epithelium 11/3; she is using her compression pumps 1 times daily. Pretty obvious that her wraps fell down especially on the left leg to mid calf. I  think things are improving on the right lateral calf, I do not see anything open posteriorly. The major areas on the left lateral where she has 3 or 4 deeper wounds in the midst of an entire de-epithelialized area. There is erythema  here but no tenderness I think this represents venous stasis rather than cellulitis. I debrided the areas on the left lateral calf last week but once again they have tightly adherent debris over the top of them which is disappointing 11/10; the patient's major areas on the left lateral calf to 3 wounds with some depth surrounded by a large area of de-epithelialized tissue. There is no infection. We have been using silver alginate under 4-layer compression. On the right lateral she has a more contained wound area that is superficial. Been using silver alginate under 4-layer compression 11/17; the patient's major wound is on the left lateral calf. We have been using silver alginate under compression. There is no open wound per se on the right but it de-epithelialized area on the lateral calf that is weeping edema fluid. I do not see much change here today. 12/1; patient arrives in clinic with her wounds looking better. Her edema control is better. The lymphedema specialist working on the left leg we have been working on the right. We have been using 4-layer compression. The patient is using a consider external compression pumps and Hydrofera Blue to the open areas. She is being measured for an abdominal compression pump and apparently that is going to go through her insurance 12/15; continued improvement. 2 small areas on the left and slightly larger 2 small areas on the right. 12/22; patient still has open areas on the right lateral calf which are superficial and the new one on the right anterior calf. She did not use her compression pumps this week. Her left leg is wrapped by our lymphedema specialist. There are no open wounds here 12/29; she still has a superficial area on the right lateral calf and area on the right medial calf. Right anterior wound area appears to have closed. She has much better edema control today than last week 04/11/2019; the patient is here with severe bilateral lymphedema,  chronic venous insufficiency with marked skin changes in her lower extremities. We are wrapping her right leg and her lymphedema specialist is putting lymphedema wraps on the left. She states she is using her compression pumps although in the past her compliance with uses been limited because of osteoarthritic pain in her knees. She only has one remaining area on the right leg however she has wrap injuries on the left at the upper level what where I think her wraps were cause. the left lateral area left posterior. These would be new this week 1/12; generally better. The wrap injuries in the upper calf on the left from last week are down to a single wound. She has 2 wounds anteriorly that are still weeping fluid although these appear to have been epithelialized surface for the most part. There is no open wounds on the right leg we are wrapping her right leg and 4-layer compression and we are going a lymphedema wrap on the left leg. The the approach is a [place] 2000 wrap on the left leg and then subsequently the right leg. She claims to be compliant with her compression pumps 1/19; the patient has 1 tiny open area on the right medial calf. The rest of this is not open. She however has a large erythematous denuded area on the left  anterior tibia extending laterally. There is no evidence of infection here. She has her new current waist high compression pumps 1/26; the patient has no open wounds on the right. On the left anteriorly much better than last week still a smattering of small weeping open areas. There is no evidence of infection Objective Constitutional Patient is hypertensive.. Pulse regular and within target range for patient.Marland Kitchen Respirations regular, non-labored and within target range.. Temperature is normal and within the target range for the patient.Marland Kitchen Appears in no distress. Vitals Time Taken: 1:36 PM, Height: 71 in, Weight: 240 lbs, BMI: 33.5, Temperature: 98.4 F, Pulse: 61  bpm, Respiratory Rate: 18 breaths/min, Blood Pressure: 157/71 mmHg. Cardiovascular Pedal pulses are palpable. Edema control is marginal [nonpitting] but actually fairly good for this patient. General Notes: Wound exam; the patient has nothing open on the right leg. ooThe large denuded area last week has mostly healed on the left anteriorly and laterally however there is small superficial weeping areas that are still open. Integumentary (Hair, Skin) Wound #39 status is Open. Original cause of wound was Gradually Appeared. The wound is located on the Left,Anterior Lower Leg. The wound measures 8.5cm length x 7cm width x 0.1cm depth; 46.731cm^2 area and 4.673cm^3 volume. The wound is limited to skin breakdown. There is no tunneling or undermining noted. There is a medium amount of serous drainage noted. The wound margin is distinct with the outline attached to the wound base. There is large (67-100%) pink granulation within the wound bed. There is no necrotic tissue within the wound bed. Wound #40 status is Healed - Epithelialized. Original cause of wound was Gradually Appeared. The wound is located on the Right,Posterior Lower Leg. The wound measures 0cm length x 0cm width x 0cm depth; 0cm^2 area and 0cm^3 volume. There is no tunneling or undermining noted. There is a none present amount of drainage noted. The wound margin is flat and intact. There is no granulation within the wound bed. There is no necrotic tissue within the wound bed. Assessment Active Problems ICD-10 Lymphedema, not elsewhere classified Non-pressure chronic ulcer of left calf limited to breakdown of skin Non-pressure chronic ulcer of right calf limited to breakdown of skin Type 2 diabetes mellitus with other skin ulcer Chronic venous hypertension (idiopathic) with inflammation of bilateral lower extremity Procedures Wound #39 Pre-procedure diagnosis of Wound #39 is a Lymphedema located on the Left,Anterior Lower Leg .  There was a Four Layer Compression Therapy Procedure by Carlene Coria, RN. Post procedure Diagnosis Wound #39: Same as Pre-Procedure Plan Follow-up Appointments: Return Appointment in 1 week. Nurse Visit: - Thursday for Lymphedema Dressing Change Frequency: Wound #39 Left,Anterior Lower Leg: Do not change entire dressing for one week. Skin Barriers/Peri-Wound Care: Barrier cream Moisturizing lotion TCA Cream or Ointment Wound Cleansing: May shower and wash wound with soap and water. Primary Wound Dressing: Wound #39 Left,Anterior Lower Leg: Calcium Alginate with Silver Wound #40 Right,Posterior Lower Leg: Calcium Alginate with Silver Secondary Dressing: Wound #39 Left,Anterior Lower Leg: Kerlix/Rolled Gauze - PT to apply lymphedema wrap over dressing ABD pad Wound #40 Right,Posterior Lower Leg: Dry Gauze ABD pad Edema Control: 4 layer compression - Right Lower Extremity - unna boot at top to help secure wrap Other: - left leg: PT to apply lymphedema wraps 1. We continue with silver alginate on the left anterior leg as the primary dressing. Wraps are being done by the lymphedema specialist 2. We are continuing with 4-layer on the right leg. There is no open wound here  Electronic Signature(s) Signed: 05/02/2019 5:32:11 PM By: Linton Ham MD Entered By: Linton Ham on 05/02/2019 15:08:13 -------------------------------------------------------------------------------- SuperBill Details Patient Name: Date of Service: PIETRA, THORNE 05/02/2019 Medical Record [id number] Patient Account Number: [id number] Date of Birth/Sex: Treating RN: 06/15/1945 (74 y.o. F) Primary Care Provider: [name] Other Clinician: Referring Provider: Treating Provider/Extender:Robson, Tenna Child, [place] Siren in Treatment: 16 Diagnosis Coding ICD-10 Codes Code Description I89.0 Lymphedema, not elsewhere classified L97.221 Non-pressure chronic ulcer of left calf  limited to breakdown of skin L97.211 Non-pressure chronic ulcer of right calf limited to breakdown of skin E11.622 Type 2 diabetes mellitus with other skin ulcer I87.323 Chronic venous hypertension (idiopathic) with inflammation of bilateral lower extremity Facility Procedures CPT4 Code Description: [id number] (Facility Use Only) [phone] - APPLY [place] RT LEG Modifier: Quantity: 1 Physician Procedures CPT4 Code Description: [id number] - WC PHYS LEVEL 3 - EST PT ICD-10 Diagnosis Description L97.221 Non-pressure chronic ulcer of left calf limited to brea I89.0 Lymphedema, not elsewhere classified I87.323 Chronic venous hypertension (idiopathic) with  inflammat extremity Modifier: kdown of skin ion of bilateral Quantity: 1 lower Electronic Signature(s) Signed: 05/02/2019 5:32:11 PM By: Linton Ham MD Signed: 05/03/2019 7:19:07 AM By: Carlene Coria RN Entered By: Carlene Coria on 05/02/2019 16:05:05

## 2019-05-04 ENCOUNTER — Ambulatory Visit: Payer: PPO

## 2019-05-04 ENCOUNTER — Other Ambulatory Visit: Payer: Self-pay

## 2019-05-04 DIAGNOSIS — I890 Lymphedema, not elsewhere classified: Secondary | ICD-10-CM

## 2019-05-04 DIAGNOSIS — R2689 Other abnormalities of gait and mobility: Secondary | ICD-10-CM

## 2019-05-04 NOTE — Therapy (Signed)
[place], [place], [address] Phone: [phone]   Fax:  [phone]  Physical Therapy Treatment  Patient Details  Name: [name] [name] MRN: [id number] Date of Birth: [birth year] Referring Provider (PT): Darci Current   Encounter Date: 05/04/2019  PT End of Session - 05/04/19 1408    Visit Number  18    Number of Visits  25    Date for PT Re-Evaluation  05/09/19    PT Start Time  [id number]    PT Stop Time  1500    PT Time Calculation (min)  53 min    Activity Tolerance  Patient tolerated treatment well    Behavior During Therapy  [facility] for tasks assessed/performed       Past Medical History:  Diagnosis Date  . Acute kidney injury ([place])   . Aortic stenosis, mild 11/17/2013  . Arthritis    "both knees, spine, back, fingers" (11/29/2013)  . CHF (congestive heart failure) ([place])   . Edema   . GERD (gastroesophageal reflux disease)   . Heart murmur   . History of diastolic dysfunction    Echo [id number] with diastolic dysfunction  . HTN (hypertension)   . Morbid obesity ([place])   . OA (osteoarthritis)   . PAC (premature atrial contraction)    per prior Holter  . Palpitations   . Pneumonia    "as a child"  . PVC's (premature ventricular contractions)    per prior Holter  . SVT (supraventricular tachycardia) ([place])   . Tachycardia    noted at 07/28/11 visit. Started on beta blocker. Possible atrial flutter vs long PR tachycardia/AVNRT  . Type II diabetes mellitus ([place])     Past Surgical History:  Procedure Laterality Date  . CESAREAN SECTION  1985  . CESAREAN SECTION  1985  . SUPRAVENTRICULAR TACHYCARDIA ABLATION  11/29/2013  . SUPRAVENTRICULAR TACHYCARDIA ABLATION N/A 11/29/2013   Procedure: SUPRAVENTRICULAR TACHYCARDIA ABLATION;  Surgeon: Evans Lance, MD;  Location[facility] CATH LAB;  Service: Cardiovascular;  Laterality: N/A;  . US ECHOCARDIOGRAPHY  07/25/2008   EF 55-60%  . VAGINAL DELIVERY       There were no vitals filed for this visit.  Subjective Assessment - 05/04/19 1409    Subjective  Pt states that she used the pump 1x/day. She states that she has worn her wrap since her last session. She reports that the cold weather has made her knees much more sore.    Pertinent History  osteo arthritis, hx cellulitis, phlebitis and history of edema in BLE, Hx Acute kidney injury, CHF and DM II    Patient Stated Goals  I want to get this swelling down and heal my wound.    Currently in Pain?  Yes    Pain Score  8     Pain Location  Knee    Pain Orientation  Right;Left    Pain Descriptors / Indicators  Aching    Pain Type  Chronic pain    Pain Onset  More than a month ago    Pain Frequency  Intermittent    Aggravating Factors   movement    Pain Relieving Factors  rest                       [facility] Adult PT Treatment/Exercise - 05/04/19 0001      Manual Therapy   Manual Therapy  Edema management;Compression Bandaging    Manual therapy comments  Pt plantar aspect of  2nd toe was cleaned with antiseptic cleanser and barrier cream was applied to address any irritation. Pt was educated to monitor her 2nd toe for any increased edema. Discussed the importance of removing her entire leg wrap if she notices any significant increase in edema in her digits due to only great toe was wrapped this session.     Edema Management  re-iterated education on how to remove wraps emphasizing staying calm to find the beginning in order to take the wraps off in order to prevent tourniquet. Encouraged pt to continue with pump at home.     Compression Bandaging  Pt wrapped this session using tubigrip as base layer with 3 artiflex for shaping. 2 8 cm for roman sandal and ankle/sole/heel, 3 10 cm for spiral wrap and 1 12 cm for thigh. Ironing throughout to prevent wrinkles no significant pull this session due to pt had good reduction w/o much pressure last session.  Only the pt great toe was wrapped  this session due to irritation at the plantar aspect of the 2nd toe.              PT Education - 05/04/19 1509    Education Details  Pt will continue with pump 1x/day, 2x is ideal. Pt will monitor her 2nd toe and call MD if she notices any increased pain, swelling, redness or drainage. She will monitor her toes due to only the great toe was wrapped to decrease rubbing on the plantar aspect of the 2nd toe.Discussed removing wrap if she notices any increased edema in digits 2-5 starting at the top and removing down the leg.    Person(s) Educated  Patient    Methods  Explanation    Comprehension  Verbalized understanding       PT Short Term Goals - 04/04/19 1515      PT SHORT TERM GOAL #1   Title  Pt will be independent with HEP    Baseline  Pt is working on her HEP occasionally at home.    Time  5    Period  Weeks    Status  On-going        PT Long Term Goals - 04/04/19 1515      PT LONG TERM GOAL #1   Title  Patient will have reduction of L lower leg limb girth by 3 cm    Baseline  Pt will be measuremed next session she has significantly reduced from her initial evaluation.    Time  5    Status  On-going    Target Date  05/23/19      PT LONG TERM GOAL #2   Title  Patient to be properly fitted with compression garment to wear on daily basis.    Baseline  Pt has a juxtalite that is too small and old that ht evelcro is no longer usable on. Trying to get her a [employer].    Time  5    Period  Weeks    Status  On-going    Target Date  05/23/19      PT LONG TERM GOAL #3   Title  Patient will be independent in self-care management principles including self-massage/bandaging and long term management plan for edema.    Baseline  Pt has difficulty with managing her lymphedema. Trying to ger her a pump to help with swelling.    Time  5    Period  Weeks    Status  On-going    Target Date  05/23/19            Plan - 05/04/19 1408    Clinical Impression Statement   Extra time spent this session re-iterating education on how to remove the wrap from top to bottom instead of removing by scooting the wraps down in order to decrease risk for tourniquet. Plantar aspect of pt L 2nd toe has redness and possible small opening. Skin was cleaned with antiseptic spray and barrier cream was applied. Only the great toe was wrapped this session with 2inch elastomull folded. 1/2 piece of ABD pad was placed at the base of the toes for extra compression to decrease risk of swelling at the digits as well as significantly less pull this session to prevent fluid builid up/blistering of the toes. Pt was educated extensively on what to look for infection of the second toe including odor, edema, rubor, pain, drainage and to call her MD if she notices any of these signs/symptoms. Pt was also educated on the importance of removing wraps if she notices any increased edema in her toes to prevent blistering. Pt thigh is much improved this session from previously and is tolerating wrapping much better in the thigh. Pt was encouraged to continue using her vasopneumatic pump. Pt will benefit from continuedPOC at this time until she is able to manage at home with the correct equipment.    Personal Factors and Comorbidities  Age;Comorbidity 3+    Comorbidities  CHF, acute kidney injury, DM II    Stability/Clinical Decision Making  Stable/Uncomplicated    Rehab Potential  Good    PT Duration  12 weeks    PT Treatment/Interventions  Electrical Stimulation;Cryotherapy;Gait training;Stair training;Functional mobility training;Therapeutic activities;Therapeutic exercise;Balance training;Neuromuscular re-education;Patient/family education;Manual lymph drainage;Compression bandaging;Passive range of motion;Taping;Vasopneumatic Device    PT Next Visit Plan  ask if she has used her pump. Assess wrap, give exercises, assess self MLD and possible work out pants?    PT Home Exercise Plan  ask about pump, provide  exercises    Consulted and Agree with Plan of Care  Patient       Patient will benefit from skilled therapeutic intervention in order to improve the following deficits and impairments:  Difficulty walking, Increased edema  Visit Diagnosis: Lymphedema  Other abnormalities of gait and mobility     Problem List Patient Active Problem List   Diagnosis Date Noted  . Postnasal drip 11/16/2017  . Sensorineural hearing loss (SNHL), bilateral 11/16/2017  . Tinnitus of both ears 11/16/2017  . Obesity, Class II, BMI 35-39.9, isolated (see actual BMI) 11/08/2017  . CKD (chronic kidney disease), stage III 11/08/2017  . Anemia 11/07/2017  . Thrombocytosis ([place]) 11/07/2017  . Lymphedema 11/07/2017  . Routine general medical examination at a health care facility 05/10/2014  . Paroxysmal SVT (supraventricular tachycardia) ([place]) 11/28/2013  . Physical deconditioning 11/23/2013  . Generalized weakness 11/17/2013  . Nausea 11/17/2013  . Multilevel spine pain 11/17/2013  . Aortic stenosis, mild 11/17/2013  . DM type 2 (diabetes mellitus, type 2) ([place]) 02/02/2013  . Arthritis 10/19/2012  . Mixed incontinence 10/19/2012  . GERD (gastroesophageal reflux disease) 04/20/2011  . HTN (hypertension) 01/20/2011  . Morbid obesity ([place])     Ander Purpura, PT 05/04/2019, 3:20 PM  [place], [place], [address] Phone: [phone]   Fax:  [phone]  Name: [name] [name] MRN: [id number] Date of Birth: [birth year]

## 2019-05-08 ENCOUNTER — Other Ambulatory Visit: Payer: Self-pay | Admitting: Internal Medicine

## 2019-05-08 DIAGNOSIS — I471 Supraventricular tachycardia: Secondary | ICD-10-CM

## 2019-05-08 NOTE — Progress Notes (Signed)
[name], [name] ([id number]) Visit Report for 05/02/2019 Arrival Information Details Patient Name: Date of Service: [name], [name] 05/02/2019 1:15 PM Medical Record [id number] Patient Account Number: [id number] Date of Birth/Sex: Treating RN: Nov 01, 1945 (74 y.o. [name] Primary Care Lavalle Skoda: [name] Other Clinician: Referring Josette Shimabukuro: Treating Aisa Schoeppner/Extender:Robson, Tenna Child, [place] Siren in Treatment: 16 Visit Information History Since Last Visit [name] Added or deleted any medications: No Patient Arrived: Any new allergies or adverse reactions: No Arrival Time: 13:37 Had a fall or experienced change in No Accompanied By: alone activities of daily living that may affect Transfer Assistance: None risk of falls: Patient Identification Verified: Yes Signs or symptoms of abuse/neglect since last No Secondary Verification Process Completed: Yes visito Patient Requires Transmission-Based No Hospitalized since last visit: No Precautions: Implantable device outside of the clinic excluding No Patient Has Alerts: No cellular tissue based products placed in the center since last visit: Has Dressing in Place as Prescribed: Yes Has Compression in Place as Prescribed: Yes Pain Present Now: No Electronic Signature(s) Signed: 05/08/2019 6:39:44 PM By: Levan Hurst RN, BSN Entered By: Levan Hurst on 05/02/2019 13:37:23 -------------------------------------------------------------------------------- Compression Therapy Details Patient Name: Date of Service: [name], [name] 05/02/2019 1:15 PM Medical Record [id number] Patient Account Number: [id number] Date of Birth/Sex: Treating RN: 10-26-1945 (74 y.o. [name] Primary Care Akili Cuda: [name] Other Clinician: Referring Denecia Brunette: Treating Travonte Byard/Extender:Robson, Tenna Child, [place] Siren in Treatment: 16 Compression Therapy Performed for Wound Wound  #39 Left,Anterior Lower Leg Assessment: Performed By: Clinician Carlene Coria, RN Compression Type: Four Layer Post Procedure Diagnosis Same as Pre-procedure Electronic Signature(s) Signed: 05/03/2019 7:19:07 AM By: Carlene Coria RN Entered By: Carlene Coria on 05/02/2019 13:58:15 -------------------------------------------------------------------------------- Encounter Discharge Information Details Patient Name: Date of Service: [name], [name] 05/02/2019 1:15 PM Medical Record [id number] Patient Account Number: [id number] Date of Birth/Sex: Treating RN: Jul 16, 1945 (74 y.o. Clearnce Sorrel Primary Care Olisa Quesnel: [name] Other Clinician: Referring Flynn Lininger: Treating Noreene Boreman/Extender:Robson, Tenna Child, [place] Siren in Treatment: 16 Encounter Discharge Information Items Discharge Condition: Stable Ambulatory Status: [name] Discharge Destination: Home Transportation: Other Accompanied By: self Schedule Follow-up Appointment: Yes Clinical Summary of Care: Patient Declined Electronic Signature(s) Signed: 05/04/2019 7:44:20 AM By: Kela Millin Entered By: Kela Millin on 05/02/2019 14:21:01 -------------------------------------------------------------------------------- Lower Extremity Assessment Details Patient Name: Date of Service: [name], [name] 05/02/2019 1:15 PM Medical Record [id number] Patient Account Number: [id number] Date of Birth/Sex: Treating RN: 1945-07-02 (74 y.o. [name] Primary Care Ellizabeth Dacruz: [name] Other Clinician: Referring Jaxsyn Catalfamo: Treating Zamaria Brazzle/Extender:Robson, Tenna Child, [place] Siren in Treatment: 16 Edema Assessment Assessed: [Left: No] [Right: No] Edema: [Left: Yes] [Right: Yes] Calf Left: Right: Point of Measurement: 30 cm From Medial Instep 56.5 cm 50.5 cm Ankle Left: Right: Point of Measurement: 8 cm From Medial Instep 28 cm 29 cm Vascular  Assessment Pulses: Dorsalis Pedis Palpable: [Left:Yes] [Right:Yes] Electronic Signature(s) Signed: 05/08/2019 6:39:44 PM By: Levan Hurst RN, BSN Entered By: Levan Hurst on 05/02/2019 13:48:28 -------------------------------------------------------------------------------- Multi Wound Chart Details Patient Name: Date of Service: [name], [name] 05/02/2019 1:15 PM Medical Record [id number] Patient Account Number: [id number] Date of Birth/Sex: Treating RN: 02-04-1946 (74 y.o. F) Primary Care Laquinta Hazell: [name] Other Clinician: Referring Roselie Cirigliano: Treating Andric Kerce/Extender:Robson, Tenna Child, [place] Siren in Treatment: 16 Vital Signs Height(in): 71 Pulse(bpm): 52 Weight(lbs): 240 Blood Pressure(mmHg): 157/71 Body Mass Index(BMI): 33 Temperature(F): 98.4 Respiratory 18 Rate(breaths/min): Photos: [39:No Photos] [40:No Photos] [N/A:N/A] Wound Location: [39:Left Lower Leg - Anterior] [40:Right Lower Leg - Posterior N/A] Wounding Event: [39:Gradually Appeared] [40:Gradually Appeared] [N/A:N/A] Primary Etiology: [39:Lymphedema] [  40:Lymphedema] [N/A:N/A] Comorbid History: [39:Cataracts, Lymphedema, Cataracts, Lymphedema, N/A Arrhythmia, Congestive Heart Failure, Hypertension, Heart Failure, Hypertension, Peripheral Venous Disease, Peripheral Venous Disease, Type II Diabetes, Osteoarthritis, Neuropathy,  Osteoarthritis, Neuropathy, Confinement Anxiety] [40:Arrhythmia, Congestive Type II Diabetes, Confinement Anxiety] Date Acquired: [39:04/11/2019] [40:04/25/2019] [N/A:N/A] Weeks of Treatment: [39:3] [40:1] [N/A:N/A] Wound Status: [39:Open] [40:Healed - Epithelialized] [N/A:N/A] Measurements L x W x D 8.5x7x0.1 [40:0x0x0] [N/A:N/A] (cm) Area (cm) : [39:46.731] [40:0] [N/A:N/A] Volume (cm) : [39:4.673] [40:0] [N/A:N/A] % Reduction in Area: [39:-29665.00%] [40:100.00%] [N/A:N/A] % Reduction in Volume: [39:-29106.20%] [40:100.00%] [N/A:N/A] Classification:  [39:Partial Thickness] [40:Full Thickness Without Exposed Support Structures] [N/A:N/A] Exudate Amount: [39:Medium] [40:None Present] [N/A:N/A] Exudate Type: [39:Serous] [40:N/A] [N/A:N/A] Exudate Color: [39:amber] [40:N/A] [N/A:N/A] Wound Margin: [39:Distinct, outline attached] [40:Flat and Intact] [N/A:N/A] Granulation Amount: [39:Large (67-100%)] [40:None Present (0%)] [N/A:N/A] Granulation Quality: [39:Pink] [40:N/A] [N/A:N/A] Necrotic Amount: [39:None Present (0%)] [40:None Present (0%)] [N/A:N/A] Exposed Structures: [39:Fascia: No Fat Layer (Subcutaneous Tissue) Exposed: No Tendon: No Muscle: No Joint: No Bone: No Limited to Skin Breakdown] [40:Fascia: No Fat Layer (Subcutaneous Tissue) Exposed: No Tendon: No Muscle: No Joint: No Bone: No] [N/A:N/A] Epithelialization: [39:None Compression Therapy] [40:Large (67-100%) N/A] [N/A:N/A N/A] Treatment Notes Wound #39 (Left, Anterior Lower Leg) 1. Cleanse With Wound Cleanser Soap and water 2. Periwound Care TCA Cream 3. Primary Dressing Applied Calcium Alginate Ag 4. Secondary Dressing ABD Pad Dry Gauze Roll Gauze 6. Support Layer Applied 4 layer compression wrap Notes Lymphedema wrap applied by PT. 4 layer applied to right lower leg. silver alginate/abd/kerlix applied to left Electronic Signature(s) Signed: 05/02/2019 5:32:11 PM By: Linton Ham MD Entered By: Linton Ham on 05/02/2019 15:04:07 -------------------------------------------------------------------------------- Multi-Disciplinary Care Plan Details Patient Name: Date of Service: [name], [name] 05/02/2019 1:15 PM Medical Record [id number] Patient Account Number: [id number] Date of Birth/Sex: Treating RN: 12/16/1945 (74 y.o. [name] Primary Care Berkleigh Beckles: [name] Other Clinician: Referring Venna Berberich: Treating Zeb Rawl/Extender:Robson, Tenna Child, [place] Si[place] in Treatment: 16 Active Inactive Wound/Skin Impairment Nursing  Diagnoses: Knowledge deficit related to ulceration/compromised skin integrity Goals: Patient/caregiver will verbalize understanding of skin care regimen Date Initiated: 01/10/2019 Target Resolution Date: 05/19/2019 Goal Status: Active Ulcer/skin breakdown will have a volume reduction of 30% by week 4 Target Resolution Date Initiated: 01/10/2019 Date Inactivated: 02/14/2019 Date: 02/10/2019 Unmet Goal Status: Unmet Reason: comorbities/lymphedema Ulcer/skin breakdown will have a volume reduction of 50% by week 8 Target Resolution Date Initiated: 02/14/2019 Date Inactivated: 03/21/2019 Date: 03/17/2019 Unmet Goal Status: Unmet Reason: comorbities/lymphedema Ulcer/skin breakdown will have a volume reduction of 80% by week 12 Target Resolution Date Initiated: 03/21/2019 Date Inactivated: 04/25/2019 Date: 04/21/2019 Unmet Goal Status: Unmet Reason: comorbities/lymphedema Ulcer/skin breakdown will heal within 14 weeks Date Initiated: 04/25/2019 Target Resolution Date: 05/26/2019 Goal Status: Active Interventions: Assess patient/caregiver ability to obtain necessary supplies Assess patient/caregiver ability to perform ulcer/skin care regimen upon admission and as needed Assess ulceration(s) every visit Notes: Electronic Signature(s) Signed: 05/03/2019 7:19:07 AM By: Carlene Coria RN Entered By: Carlene Coria on 05/02/2019 13:37:19 -------------------------------------------------------------------------------- Pain Assessment Details Patient Name: Date of Service: [name], [name] 05/02/2019 1:15 PM Medical Record [id number] Patient Account Number: [id number] Date of Birth/Sex: Treating RN: 10-24-1945 (74 y.o. [name] Primary Care Merritt Kibby: [name] Other Clinician: Referring Thaddius Manes: Treating Harleyquinn Gasser/Extender:Robson, Tenna Child, [place] Si[place] in Treatment: 16 Active Problems Location of Pain Severity and Description of Pain Patient Has Paino  No Site Locations Pain Management and Medication Current Pain Management: Electronic Signature(s) Signed: 05/08/2019 6:39:44 PM By: Levan Hurst RN, BSN Entered By: Levan Hurst on 05/02/2019 13:37:54 --------------------------------------------------------------------------------  Patient/Caregiver Education Details Patient Name: Date of Service: [name], [name] 1/26/2021andnbsp1:15 PM Medical Record [id number] Patient Account Number: [id number] Date of Birth/Gender: Treating RN: 1945/07/20 (74 y.o. [name] Primary Care Physician: [name] Other Clinician: Referring Physician: Treating Physician/Extender:Robson, Tenna Child, [place] Si[place] in Treatment: 16 Education Assessment Education Provided To: Patient Education Topics Provided Wound/Skin Impairment: Methods: Explain/Verbal Responses: State content correctly Electronic Signature(s) Signed: 05/03/2019 7:19:07 AM By: Carlene Coria RN Entered By: Carlene Coria on 05/02/2019 13:37:35 -------------------------------------------------------------------------------- Wound Assessment Details Patient Name: Date of Service: [name], [name] 05/02/2019 1:15 PM Medical Record [id number] Patient Account Number: [id number] Date of Birth/Sex: Treating RN: [birth year] (74 y.o. F) Primary Care Mathieu Schloemer: [name] Other Clinician: Referring Laityn Bensen: Treating Sophi Calligan/Extender:Robson, Tenna Child, [place] Si[place] in Treatment: 16 Wound Status Wound Number: 39 Primary Lymphedema Etiology: Wound Location: Left Lower Leg - Anterior Wound Open Wounding Event: Gradually Appeared Status: Date Acquired: 04/11/2019 Comorbid Cataracts, Lymphedema, Arrhythmia, Weeks Of Treatment: 3 History: Congestive Heart Failure, Hypertension, Clustered Wound: No Peripheral Venous Disease, Type II Diabetes, Osteoarthritis, Neuropathy, Confinement Anxiety Photos Wound Measurements Length: (cm)  8.5 Width: (cm) 7 Depth: (cm) 0.1 Area: (cm) 46.731 Volume: (cm) 4.673 Wound Description Classification: Partial Thickness Wound Margin: Distinct, outline attached Exudate Amount: Medium Exudate Type: Serous Exudate Color: amber Wound Bed Granulation Amount: Large (67-100%) Granulation Quality: Pink Necrotic Amount: None Present (0%) After Cleansing: No brino Yes Exposed Structure posed: No (Subcutaneous Tissue) Exposed: No posed: No posed: No t Exposed: No Exposed: No ted to Skin Breakdown % Reduction in Area: -29665% % Reduction in Volume: -29106.2% Epithelialization: None Tunneling: No Undermining: No Foul Odor Slough/Fi Fascia Ex Fat Layer Tendon Ex Muscle Ex Join Bone Limi Treatment Notes Wound #39 (Left, Anterior Lower Leg) 1. Cleanse With Wound Cleanser Soap and water 2. Periwound Care TCA Cream 3. Primary Dressing Applied Calcium Alginate Ag 4. Secondary Dressing ABD Pad Dry Gauze Roll Gauze 6. Support Layer Applied 4 layer compression wrap Notes Lymphedema wrap applied by PT. 4 layer applied to right lower leg. silver alginate/abd/kerlix applied to left Electronic Signature(s) Signed: 05/03/2019 4:33:34 PM By: Mikeal Hawthorne EMT/HBOT Entered By: Mikeal Hawthorne on 05/03/2019 15:54:13 -------------------------------------------------------------------------------- Wound Assessment Details Patient Name: Date of Service: [name], [name] 05/02/2019 1:15 PM Medical Record [id number] Patient Account Number: [id number] Date of Birth/Sex: Treating RN: 1945/09/02 (74 y.o. F) Primary Care Salli Bodin: [name] Other Clinician: Referring Charistopher Rumble: Treating Celia Gibbons/Extender:Robson, Tenna Child, [place] Si[place] in Treatment: 16 Wound Status Wound Number: 40 Primary Lymphedema Etiology: Wound Location: Right Lower Leg - Posterior Wound Healed - Epithelialized Wounding Event: Gradually Appeared Status: Date Acquired:  04/25/2019 Comorbid Cataracts, Lymphedema, Arrhythmia, Weeks Of Treatment: 1 History: Congestive Heart Failure, Hypertension, Clustered Wound: No Peripheral Venous Disease, Type II Diabetes, Osteoarthritis, Neuropathy, Confinement Anxiety Photos Wound Measurements Length: (cm) 0 % Reduct Width: (cm) 0 % Reduct Depth: (cm) 0 Epitheli Area: (cm) 0 Tunneli Volume: (cm) 0 Undermi Wound Description Classification: Full Thickness Without Exposed Support Structures Wound Flat and Intact Margin: Exudate None Present Amount: Wound Bed Granulation Amount: None Present (0%) Necrotic Amount: None Present (0%) Foul Odor After Cleansing: No Slough/Fibrino No Exposed Structure Fascia Exposed: No Fat Layer (Subcutaneous Tissue) Exposed: No Tendon Exposed: No Muscle Exposed: No Joint Exposed: No Bone Exposed: No ion in Area: 100% ion in Volume: 100% alization: Large (67-100%) ng: No ning: No Electronic Signature(s) Signed: 05/03/2019 4:33:34 PM By: Mikeal Hawthorne EMT/HBOT Entered By: Mikeal Hawthorne on 05/03/2019 15:53:33 -------------------------------------------------------------------------------- Vitals Details Patient Name: Date of Service: [name] [name], [name] [name] 05/02/2019 1:15  PM Medical Record [id number] Patient Account Number: [id number] Date of Birth/Sex: Treating RN: June 26, 1945 (74 y.o. [name] Primary Care Shakeyla Giebler: [name] Other Clinician: Referring Maudean Hoffmann: Treating Rozina Pointer/Extender:Robson, Tenna Child, [place] Si[place] in Treatment: 16 Vital Signs Time Taken: 13:36 Temperature (F): 98.4 Height (in): 71 Pulse (bpm): 61 Weight (lbs): 240 Respiratory Rate (breaths/min): 18 Body Mass Index (BMI): 33.5 Blood Pressure (mmHg): 157/71 Reference Range: 80 - 120 mg / dl Electronic Signature(s) Signed: 05/08/2019 6:39:44 PM By: Levan Hurst RN, BSN Entered By: Levan Hurst on 05/02/2019 13:37:47

## 2019-05-09 ENCOUNTER — Ambulatory Visit: Payer: PPO | Attending: Internal Medicine

## 2019-05-09 ENCOUNTER — Encounter (HOSPITAL_BASED_OUTPATIENT_CLINIC_OR_DEPARTMENT_OTHER): Payer: PPO | Admitting: Internal Medicine

## 2019-05-09 ENCOUNTER — Other Ambulatory Visit: Payer: Self-pay

## 2019-05-09 DIAGNOSIS — Z9119 Patient's noncompliance with other medical treatment and regimen: Secondary | ICD-10-CM | POA: Diagnosis not present

## 2019-05-09 DIAGNOSIS — I110 Hypertensive heart disease with heart failure: Secondary | ICD-10-CM | POA: Insufficient documentation

## 2019-05-09 DIAGNOSIS — E11622 Type 2 diabetes mellitus with other skin ulcer: Secondary | ICD-10-CM | POA: Insufficient documentation

## 2019-05-09 DIAGNOSIS — I872 Venous insufficiency (chronic) (peripheral): Secondary | ICD-10-CM | POA: Insufficient documentation

## 2019-05-09 DIAGNOSIS — I890 Lymphedema, not elsewhere classified: Secondary | ICD-10-CM

## 2019-05-09 DIAGNOSIS — I509 Heart failure, unspecified: Secondary | ICD-10-CM | POA: Insufficient documentation

## 2019-05-09 DIAGNOSIS — M170 Bilateral primary osteoarthritis of knee: Secondary | ICD-10-CM | POA: Diagnosis not present

## 2019-05-09 DIAGNOSIS — I87323 Chronic venous hypertension (idiopathic) with inflammation of bilateral lower extremity: Secondary | ICD-10-CM | POA: Insufficient documentation

## 2019-05-09 DIAGNOSIS — Z885 Allergy status to narcotic agent status: Secondary | ICD-10-CM | POA: Insufficient documentation

## 2019-05-09 DIAGNOSIS — L97221 Non-pressure chronic ulcer of left calf limited to breakdown of skin: Secondary | ICD-10-CM | POA: Diagnosis not present

## 2019-05-09 DIAGNOSIS — Z880 Allergy status to penicillin: Secondary | ICD-10-CM | POA: Insufficient documentation

## 2019-05-09 DIAGNOSIS — L97212 Non-pressure chronic ulcer of right calf with fat layer exposed: Secondary | ICD-10-CM | POA: Diagnosis not present

## 2019-05-09 DIAGNOSIS — R2689 Other abnormalities of gait and mobility: Secondary | ICD-10-CM | POA: Diagnosis not present

## 2019-05-09 DIAGNOSIS — S81802A Unspecified open wound, left lower leg, initial encounter: Secondary | ICD-10-CM | POA: Diagnosis not present

## 2019-05-09 DIAGNOSIS — E1140 Type 2 diabetes mellitus with diabetic neuropathy, unspecified: Secondary | ICD-10-CM | POA: Diagnosis not present

## 2019-05-09 DIAGNOSIS — S91105A Unspecified open wound of left lesser toe(s) without damage to nail, initial encounter: Secondary | ICD-10-CM | POA: Diagnosis not present

## 2019-05-09 DIAGNOSIS — L089 Local infection of the skin and subcutaneous tissue, unspecified: Secondary | ICD-10-CM | POA: Diagnosis not present

## 2019-05-09 NOTE — Progress Notes (Signed)
ATTIE, LEVINE ([id number]) Visit Report for 05/09/2019 HPI Details Patient Name: Date of Service: ZAKYRAH, REUSS 05/09/2019 1:15 PM Medical Record [id number] Patient Account Number: [id number] Date of Birth/Sex: Treating RN: 1945-05-31 (74 y.o. [name] Primary Care Provider: [name] Other Clinician: Referring Provider: Treating Provider/Extender:, Tenna Child, [place] Siren in Treatment: 17 History of Present Illness HPI Description: 04/09/15; the patient is here for review of wounds on her bilateral lower extremities for the last month. The patient has a history of lymphedema and bilateral chronic venous insufficiency with inflammation she was here for review of wounds on her bilateral legs in 2014 she was discharged home with external compression pumps which she is currently not using. Over the last month she developed wounds on her bilateral lower legs with drainage and pain and she is here for our review of this. 04/16/15; the patient's edema is under much better control. She has 3 areas one on the right anterior medial leg one on the left posterior leg and a small open area with purulent drainage on the left anterior leg. I have cultured the area on the left anterior leg 04/23/15; continued improvement in the patient's edema. All her wounds of closed I see no open areas. Culture from the left anterior leg last time was negative. READMISSION 02/07/16; this patient is a woman that I saw her earlier in 2017. I believe we discharged her very quickly with bilateral wounds in her legs secondary to chronic venous hypertension with inflammation and secondary lymphedema. She had juxtalite stockings. She has external compression pumps at home from a stay in our clinic in 2014. She tells me that she's had wounds on her bilateral lower legs since March or April of this year. 3 wounds on the left 2 on the right. She is a type II diabetic and has a history of  noncompressible vessels bilaterally although she is tolerated 4-layer compression. The last note from her primary physician Dr. [name] was in May. At that point Dr. [name] had referred the patient here for lymphedema management. The patient stated she made a phone call to year but was not able to arrange an appointment. It is not clear that she is actually using anything on the wounds currently except some gauze that was saturated with drainage per our intake nurse. She is certainly not using her juxtalite stockings and by the patient's own admission she is not using her compression pumps because they are "irritating". I don't see a recent hemoglobin A1c. The patient states she has not been systemically unwell but the legs are painful 02/14/16; patient tolerated her Profore wraps reasonably well. She is using the compression pumps 1 time per day. Measurements of her legs are better in terms of the amount of lymphedema 02/21/16; patient tolerated her Profore wraps it. She says she is using her compression pumps one or 2 times a day. Measurements of her leg circumference are improved and most of her wound dimensions are better, unfortunately the home health that we referred her to did not except her insurance but works successful in getting her accompanied that would except her insurance however she apparently has a $25 co-pay which she cannot afford as she is on a fixed income 03/05/16; patient states she did not back using the pumps. She is healed that 2 wounds on the left leg. She still has areas on the right lateral lower leg, right medial lower leg and left lateral lower leg. All of these look some better and have  reduced in area 03/13/16; still 3 wounds 1 on the left lateral leg, one on the left medial/posterior leg which is really the most extensive and a small area on the right lateral leg. All of this in the setting of severe chronic lymphedema and chronic  venous inflammation. Damage to the skin with cobblestone thickened skin. 03/20/16; still 2 areas. The area on the left lateral leg appears to of closed over. The right medial leg is still most extensive wound although it appears to be closing over right lateral only a small open area remains. Using silver alginate Profore wraps 03/27/16; the area on the left leg remains closed. The edema control is better on the left than the right. The right medial leg is still weeping but everything appears to be epithelialized. The lateral aspect of the right leg appears closed. She tells me she has old juxtalite stockings. She has been compliant with her compression pumps. 04/03/16; the area on the left leg remains closed. She has ordered new juxtalite stockings. The right posterior medial wound is fully epithelialized. However she has an irritated area over the right Achilles that she states is been itching all week under the wraps.. She has been compliant with her compression pumps 04/10/16; the area on the left leg remains closed. The right posterior medial wound is also closed today. The irritated area over the right Achilles which was probably wrap injury is also fully epithelialized and closed. The patient has not received her new juxtalite stockings, she arrives in clinic today with an old juxtalite stocking in place. Sheis using her compression pumps secondary to her severe stage III lymphedema READMISSION 01/21/17; this is a patient we know from several previous stays in this clinic. She has lymphedema with chronic venous insufficiency and inflammation. The last time she was here we discharged her with bilateral juxta lites which she is compliant with. She also has external compression pumps at home from stays in our clinic in 2014 although she tells me that she is not very compliant with these as when she uses them a causes pain in her bilateral knees the patient states that her wounds on her left  greater than right leg including 2 large areas on the left anterior and left lateral leg and an area on the right leg open in late June or July since then she is been followed by [facility] and she's had bilateral Unna boot supplied every week for the last month.. I think the wounds have actually improved although they are not specifically dressed. She states she had an ultrasound to rule out DVT bilaterally that was negative. She does not have arterial studies although she is a diabetic. She states she has a lot of pain in her legs she states she does not use her external compression pumps because it causes pain in her knees. As usual her ABIs were noncompressible bilaterally in this clinic 01/28/17; currently the patient only has one small wound on the left lateral leg and I think this should be healed next week. We are going to try to get her bilateral extremitease stockings. She tells me that compression pumps heard her posterior rib arthritic knees so she is not been compliant with this 02/04/17; the patient's wounds are totally healed. She has chronic venous insufficiency and secondary lymphedema. She has new extremitease stockings. She also has compression pumps Readmission: 04/21/17 on evaluation today patient appears to be doing about the same as what she was previous when we were evaluating  her back in November 2018. At that point she had completely healed. With that being said she has now reopened and states that she wasn't aware that she was supposed to come back. 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 Subsequently she also states that although she has the compression that we got for her previously the extremitease stockings she nonetheless doesn't always wear these and being noncompliant often has things will reopen as far as ulcers on her lower extremities. No fevers,  chills, nausea, or vomiting noted at this time. Patient has no evidence of dementia. She is having some discomfort in regard to the ulcers at this point. 04/28/17 on evaluation today patient appears to be doing significantly better in regard to her wounds. With that being said she is still having significant discomfort and is not really keen on sharp debridement. She does not appear to have any evidence of infection which is good news I do think the Iodoflex is beneficial for her. Of note her ABI's were found in the system to be on the right 1.21 with a TBI of 0.76 and on the left 1.35 with a TBI of 0.75. In general her swelling appears to be significantly improved however. No fevers, chills, nausea, or vomiting noted at this time. Patient has been tolerating the wraps without complication. She has no evidence of dementia. 05/05/17-she is here in follow-up evaluation for bilateral lower extremity venous and lymphedema ulcers. She states she has not been using her lymphedema pumps secondary to pain at her knees. After discussion it was noted that she uses knee-high lymphedema pumps but does have a pair of thigh-high lymphedema pumps. She was strongly advised to use the thigh-high lymphedema pumps and if necessary reduce the setting for improved tolerance. She reports no change in odor and/or drainage. She continues to tolerate sharp debridement poorly with significant pain, primarily to the right lower extremity. We will continue with current treatment plan, along with improved lymphedema pump use and follow-up next week 05/12/17 on evaluation today patient actually appears to be doing much better in regard to her bilateral lower extremity ulcers. Only the right altar actually requires debridement today. The other two cleaned off nicely in two of the three in fact on the left lower extremity or actually almost completely healed. She does have some weeping noted still the bilateral lower extremities left  greater than right. She still is not able to use her compression pumps even though I have mentioned this several times to her as did Leah last week. No fevers, chills, nausea, or vomiting noted at this time. Patient has been tolerating the dressing changes without complication. Patient has no evidence of dementia 05/19/17 on evaluation today patient appears to be doing very well regard to her bilateral lower extremity ulcers. In fact she seems to be improving and in fact healed in a couple of locations that were giving her trouble last week. Overall I'm extremely pleased with how things have progressed. She still has some discomfort especially in the right lateral lower extremity. Fortunately there does not appear to be any evidence of significant infection which is good news she does tell me that she has used her compression pumps several times although not routinely over the past week I did praise her for this. I do think it will be beneficial and that might even be what's helping with her wounds as far  as healing is concerned at least to a degree. No fevers, chills, nausea, or vomiting noted at this time. Patient has no dementia and no evidence of infection. 05/26/17 on evaluation today patient presents for evaluation concerning her bilateral lower extremity ulcer secondary to lymphedema. Fortunately she appears to be doing very well at this point in time. Specifically her right leg appears to be completely closed and healed. The left leg though there is still a small area of opening overall has healed. She does have a little bit of excoriation at the top or the wraps are because they slid down on her. Otherwise there's no evidence of infection and the other has improved dramatically now that she is not draining as significantly. Patient is having no significant pain she tells me she has been able to use her lymphedema pumps one time a day although that's not all she can tolerate. Even that is  difficult for her. 06/02/17 on evaluation today patient's ulcers appear to be completely closed at this point. She does still note some odor but I believe this is due to the fact she has not been able to wash her legs as well currently. Fortunately she did bring her compression with her today. She does have the EXTREMIT-EASE Compression. With that being said she is somewhat nervous about going back to this as previously she broke out yet again once we discontinue wrapping her. READMISSION 11/23/17; patient is now 74 year old diabetic woman with severe chronic venous insufficiency with lymphedema. We care for her last in this clinic in February 2019 at which time she had chronic venous insufficiency with lymphedema bilateral lower extremity ulcers. We discharged her with bilateral wrap around/extremitease stockings and compression pumps. Is fairly clear she is not using the compression pumps stating it causes pain in her knees. In any case she was admitted to hospital from 8/3 through 8/6 with a several week history of recurrent bilateral ulcers. She was felt to have bilateral cellulitis treated with doxycycline IV. She is still on oral doxycycline and has another 2 days worth. At discharge her white count was 14.1 hemoglobin at 8.9. The patient is a diabetic however her last ABIs in 2018 showed a right ABI of 1.21, left of 1.35. TBI's were 0.76 on the right 0.75 on the left. Posterior tibial artery was monophasic on the right triphasic on the left. Dorsalis pedis triphasic on the right and triphasic on the left. She is not felt to have significant macrovascular disease 12/02/17;significant bilateral lower extremity wounds secondary to severe uncontrolled lymphedema. She did not get home health out to change the wraps. So she kept this on all week. She is telling me she using his her compression pumps once a week. In general all the wounds look better. We've been using silver alginate with secondary  absorptive dressings 4 layer compression 12/10/17; bilateral lower extremity wounds secondary to uncontrolled lymphedema. She tells me she is using her compression pumps on most days. We've been keeping wraps on all week. Using silver alginate. She has wounds on the right lateral calf left medial and left lateral calf. All of this is somewhat better 12/16/17; the patient has 3 small open areas. One on the right lateral calf, one on the left medial calf and the small area on the left lateral calf. She has lymphedema, chronic venous insufficiency with hemosiderin deposition. Very fibrotic distal lower extremity scan with suggestion of an inverted bottle sign appearance to her legs. She has compression pumps but she does  not use them has not used them in the last week. Currently we have her in 4 layer compression, silver alginate to the wounds and this is being changed once by home health. She is noncompliant with the compression pumps because she says it hurts her knees. She also has compression stockings ando Juxta light stockings at home 12/24/17; the patient's right lateral calf is healed as well as the left medial. There is no open wound on the right leg. She still has 2 areas on the left lateral calf. We transitioned the right leg into an extremitease stocking which she brought in from home. 12/31/17; the patient has a superficial wound on the left lateral lower calf. We've been wrapping this leg. She arrives today with the right leg swollen but without a wound. She cannot get her extremities stocking on the right leg. There is a lot more edema here. She is not using her compression pumps 01/10/2018; patient has a same superficial wound on the left lateral calf. Her right leg has less peripheral edema. She tells me she also started herself on some Lasix that had been previously prescribed for her at some point but she had never taken it. She is also concerned about whether her compression pumps are  leaking. She has extremitease stockings however the edema gets bad enough she can get these on. Currently she has no open wound on the right leg and is mention the edema is actually better than when I saw this 10 days ago 01/17/2018; patient has a same superficial area on the posterior left calf and a weeping area in the mid part of the right lateral calf. She still does not have anyone from [facility] coming to see her compression pumps which she describes as being suspicious for a leakage. I am not really sure how frequently she is using this in any case. She has massive bilateral calf lymphedema. I think there is some spreading lymphedema into her posterior thighs. 01/24/2018; the patient's edema in her legs is actually some better. She still has a smaller area on the left posterior calf although this is better. The weeping area on the right lateral calf is also better. As it turned out she did not get her pumps from [facility] but [facility] and they are going to try to go out and see these this week which is going to help. I explained to her that she will use the compression pumps once a day while we are wrapping her legs but when she transitions to stockings that may be she will require pumping twice a day. Her compliance in this area has not been high and I wonder about her ability to do this herself. 02/01/2018; the area on the posterior left leg is healed. She still has the original wound on the right lateral calf which is epithelialized. The however just above this there is still weeping lymphedema fluid. She has a new area on the upper right calf which is either wrap injury or is she points out where she has been scratching under the wraps. We still not have managed to get a hold of a representative of [facility] to see if the pump is operational. She has extremitease stockings at home however I am not of the opinion that this will maintain her skin  integrity without the compression pumps. 02/08/2018; posterior left leg remains healed although she has a new small open area on the left lateral calf. Right lateral calf has two quarter  sized open areas and close juxtaposition. There is less weeping edema fluid. She apparently managed to contact [facility]. They are sending her a part. But she is still not received it 02/15/2018; the patient has no open wound on either leg. Some scale present on the right upper lateral calf just below the fibular head. Edema control is excellent. She did not bring her stockings or her juxta lite wrap around. She has the part for her compression pump but she has not use the compression pumps yet READMISSION 01/10/2019 Patient is now a 74 year old woman that we have had in the clinic multiple times in the past. She has chronic lower extremity lymphedema, recurrent wounds on her bilateral lower extremities. She is also a type II diabetic reasonably well controlled with a recent hemoglobin A1c while she was in the hospital of 5.9. She has wounds on her bilateral lower extremities mostly on the left lateral and right lateral. These were noted also during her recent hospitalization. The patient states they have been there for a while. She has not been able to wear her stockings he has trouble getting them on as she lives alone. She also has external compression pumps but uses them sparingly because it causes pain in her knees which hurt because of osteoarthritis. The patient was on doxycycline before she went into the hospital apparently for fear of infection in her legs and is on Keflex coming out because of a UTI The patient was recently in the hospital from 01/06/2019 through 01/09/2019. She was admitted with nausea and vomiting and prerenal azotemia. This was corrected with IV fluid her Lasix has been put on hold. Hemoglobin A1c at 5.9 her metformin was discontinued. She was also felt to have a UTI she was  prescribed Keflex at 503 times a day she is picking that up today. The patient is not felt to have an arterial issue. ABIs in our clinic were 1.24 on the right and 1.06 on the left. She had formal studies in 2018 at which time her ABIs were 1.21 on the right TBI of 0.76 and on the left 1.35 and 0.75 respectively. Waveforms are on the right were monophasic and biphasic, triphasic and biphasic on the left. She is tolerated 4 layer compression in the past 10/13. Comes in today having not used her compression pumps /perhaps once in the last week. She has too much edema to consider healing these wounds. We have been using silver alginate 10/20; she tells me he had she used her compression pumps once a day as we asked. Clearly she has edema out of her legs although most of it seems to be settling around her knees which is what she complains about. We have been using silver alginate to the extensive de-epithelialized area on the left lateral calf and a much smaller area on the right lateral extending linearly into the right posterior 10/27; she is using her compression pumps daily. Her edema control is better. We have been using silver alginate to the extensive wound areas on her bilateral lower legs. This includes left anterior left lateral and left posterior. Right anterior right lateral and right posterior. The wounds are not all in the same condition. She has severe bilateral skin damage with the epithelialization, flaking dried epithelium 11/3; she is using her compression pumps 1 times daily. Pretty obvious that her wraps fell down especially on the left leg to mid calf. I think things are improving on the right lateral calf, I do not see  anything open posteriorly. The major areas on the left lateral where she has 3 or 4 deeper wounds in the midst of an entire de-epithelialized area. There is erythema here but no tenderness I think this represents venous stasis rather than cellulitis. I debrided  the areas on the left lateral calf last week but once again they have tightly adherent debris over the top of them which is disappointing 11/10; the patient's major areas on the left lateral calf to 3 wounds with some depth surrounded by a large area of de-epithelialized tissue. There is no infection. We have been using silver alginate under 4-layer compression. On the right lateral she has a more contained wound area that is superficial. Been using silver alginate under 4-layer compression 11/17; the patient's major wound is on the left lateral calf. We have been using silver alginate under compression. There is no open wound per se on the right but it de-epithelialized area on the lateral calf that is weeping edema fluid. I do not see much change here today. 12/1; patient arrives in clinic with her wounds looking better. Her edema control is better. The lymphedema specialist working on the left leg we have been working on the right. We have been using 4-layer compression. The patient is using a consider external compression pumps and Hydrofera Blue to the open areas. She is being measured for an abdominal compression pump and apparently that is going to go through her insurance 12/15; continued improvement. 2 small areas on the left and slightly larger 2 small areas on the right. 12/22; patient still has open areas on the right lateral calf which are superficial and the new one on the right anterior calf. She did not use her compression pumps this week. Her left leg is wrapped by our lymphedema specialist. There are no open wounds here 12/29; she still has a superficial area on the right lateral calf and area on the right medial calf. Right anterior wound area appears to have closed. She has much better edema control today than last week 04/11/2019; the patient is here with severe bilateral lymphedema, chronic venous insufficiency with marked skin changes in her lower extremities. We are  wrapping her right leg and her lymphedema specialist is putting lymphedema wraps on the left. She states she is using her compression pumps although in the past her compliance with uses been limited because of osteoarthritic pain in her knees. She only has one remaining area on the right leg however she has wrap injuries on the left at the upper level what where I think her wraps were cause. the left lateral area left posterior. These would be new this week 1/12; generally better. The wrap injuries in the upper calf on the left from last week are down to a single wound. She has 2 wounds anteriorly that are still weeping fluid although these appear to have been epithelialized surface for the most part. There is no open wounds on the right leg we are wrapping her right leg and 4-layer compression and we are going a lymphedema wrap on the left leg. The the approach is a [place] 2000 wrap on the left leg and then subsequently the right leg. She claims to be compliant with her compression pumps 1/19; the patient has 1 tiny open area on the right medial calf. The rest of this is not open. She however has a large erythematous denuded area on the left anterior tibia extending laterally. There is no evidence of infection here. She has  her new current waist high compression pumps 1/26; the patient has no open wounds on the right. On the left anteriorly much better than last week still a smattering of small weeping open areas. There is no evidence of infection 2/2; still no open wounds on the right leg. He still does not have a external compression garment. She arrives in clinic today with an open area on the plantar aspect of the left second toe at the base of the PIP. This apparently was painful last week so it was not wrapped by our lymphedema specialist. There was no open wound at the time. The patient denies traumatizing the toe however today she arrives with a small but deep wound in this area the  dorsal surface of the toe is very tender. Electronic Signature(s) Signed: 05/09/2019 5:22:39 PM By: Linton Ham MD Entered By: Linton Ham on 05/09/2019 14:32:42 -------------------------------------------------------------------------------- Physical Exam Details Patient Name: Date of Service: MURIEL, BABERS 05/09/2019 1:15 PM Medical Record [id number] Patient Account Number: [id number] Date of Birth/Sex: Treating RN: Oct 16, 1945 (74 y.o. [name] Primary Care Provider: [name] Other Clinician: Referring Provider: Treating Provider/Extender:, Tenna Child, [place] Siren in Treatment: 32 Constitutional Patient is hypertensive.. Pulse regular and within target range for patient.Marland Kitchen Respirations regular, non-labored and within target range.. Temperature is normal and within the target range for the patient.Marland Kitchen Appears in no distress. Notes Wound exam; the patient has nothing open on the right leg. On the left leg anteriorly there is still areas of denuded skin x2 in the mid tibia area. Most problematically this week a small but deep wound on the plantar base of the left second PIP. Very difficult to get a look at this as she has a hammer deformity. There is a lot of tenderness. Electronic Signature(s) Signed: 05/09/2019 5:22:39 PM By: Linton Ham MD Entered By: Linton Ham on 05/09/2019 14:34:23 -------------------------------------------------------------------------------- Physician Orders Details Patient Name: Date of Service: ROSAMARY, KENKEL 05/09/2019 1:15 PM Medical Record [id number] Patient Account Number: [id number] Date of Birth/Sex: Treating RN: 27-Dec-1945 (74 y.o. [name] Primary Care Provider: [name] Other Clinician: Referring Provider: Treating Provider/Extender:, Tenna Child, [place] Siren in Treatment: 17 Verbal / Phone Orders: No Diagnosis Coding ICD-10 Coding Code  Description I89.0 Lymphedema, not elsewhere classified L97.221 Non-pressure chronic ulcer of left calf limited to breakdown of skin L97.211 Non-pressure chronic ulcer of right calf limited to breakdown of skin E11.622 Type 2 diabetes mellitus with other skin ulcer I87.323 Chronic venous hypertension (idiopathic) with inflammation of bilateral lower extremity Follow-up Appointments Return Appointment in 1 week. Nurse Visit: - Thursday for Lymphedema Dressing Change Frequency Wound #39 Left,Anterior Lower Leg Do not change entire dressing for one week. Wound #41 Left,Plantar Toe Second Do not change entire dressing for one week. Skin Barriers/Peri-Wound Care Wound #39 Left,Anterior Lower Leg Barrier cream Wound Cleansing May shower and wash wound with soap and water. Primary Wound Dressing Wound #39 Left,Anterior Lower Leg Calcium Alginate with Silver Wound #41 Left,Plantar Toe Second Calcium Alginate with Silver Secondary Dressing Wound #39 Left,Anterior Lower Leg Kerlix/Rolled Gauze - PT to apply lymphedema wrap over dressing ABD pad Wound #41 Left,Plantar Toe Second Kerlix/Rolled Gauze Dry Gauze Edema Control 4 layer compression - Right Lower Extremity - unna boot at top to help secure wrap Other: - left leg: PT to apply lymphedema wraps Patient Medications Allergies: penicillin, oxycodone HCl, adhesive Notifications Medication Indication Start End doxycycline monohydrate infection left 2nd 05/09/2019 toe DOSE oral 100 mg capsule - 1 capsule oral bid for  7 days Electronic Signature(s) Signed: 05/09/2019 2:37:09 PM By: Linton Ham MD Entered By: Linton Ham on 05/09/2019 14:37:07 -------------------------------------------------------------------------------- Problem List Details Patient Name: Date of Service: ELIZABETA, MILLIKEN 05/09/2019 1:15 PM Medical Record [id number] Patient Account Number: [id number] Date of Birth/Sex: Treating RN: [birth year] (74 y.o. [name] Primary Care Provider: [name] Other Clinician: Referring Provider: Treating Provider/Extender:, Tenna Child, [place] Siren in Treatment: 17 Active Problems ICD-10 Evaluated Encounter Code Description Active Date Today Diagnosis I89.0 Lymphedema, not elsewhere classified 01/10/2019 No Yes L97.221 Non-pressure chronic ulcer of left calf limited to 01/10/2019 No Yes breakdown of skin L97.528 Non-pressure chronic ulcer of other part of left foot 05/09/2019 No Yes with other specified severity L97.211 Non-pressure chronic ulcer of right calf limited to 01/10/2019 No Yes breakdown of skin E11.622 Type 2 diabetes mellitus with other skin ulcer 01/10/2019 No Yes I87.323 Chronic venous hypertension (idiopathic) with 04/25/2019 No Yes inflammation of bilateral lower extremity Inactive Problems Resolved Problems Electronic Signature(s) Signed: 05/09/2019 5:22:39 PM By: Linton Ham MD Entered By: Linton Ham on 05/09/2019 14:30:49 -------------------------------------------------------------------------------- Progress Note Details Patient Name: Date of Service: KALIYA, REDDIN 05/09/2019 1:15 PM Medical Record [id number] Patient Account Number: [id number] Date of Birth/Sex: Treating RN: 1945-08-22 (74 y.o. [name] Primary Care Provider: [name] Other Clinician: Referring Provider: Treating Provider/Extender:, Tenna Child, [place] Siren in Treatment: 17 Subjective History of Present Illness (HPI) 04/09/15; the patient is here for review of wounds on her bilateral lower extremities for the last month. The patient has a history of lymphedema and bilateral chronic venous insufficiency with inflammation she was here for review of wounds on her bilateral legs in 2014 she was discharged home with external compression pumps which she is currently not using. Over the last month she developed wounds on her bilateral lower  legs with drainage and pain and she is here for our review of this. 04/16/15; the patient's edema is under much better control. She has 3 areas one on the right anterior medial leg one on the left posterior leg and a small open area with purulent drainage on the left anterior leg. I have cultured the area on the left anterior leg 04/23/15; continued improvement in the patient's edema. All her wounds of closed I see no open areas. Culture from the left anterior leg last time was negative. READMISSION 02/07/16; this patient is a woman that I saw her earlier in 2017. I believe we discharged her very quickly with bilateral wounds in her legs secondary to chronic venous hypertension with inflammation and secondary lymphedema. She had juxtalite stockings. She has external compression pumps at home from a stay in our clinic in 2014. She tells me that she's had wounds on her bilateral lower legs since March or April of this year. 3 wounds on the left 2 on the right. She is a type II diabetic and has a history of noncompressible vessels bilaterally although she is tolerated 4-layer compression. The last note from her primary physician Dr. [name] was in May. At that point Dr. [name] had referred the patient here for lymphedema management. The patient stated she made a phone call to year but was not able to arrange an appointment. It is not clear that she is actually using anything on the wounds currently except some gauze that was saturated with drainage per our intake nurse. She is certainly not using her juxtalite stockings and by the patient's own admission she is not using her compression pumps because they are "irritating". I  don't see a recent hemoglobin A1c. The patient states she has not been systemically unwell but the legs are painful 02/14/16; patient tolerated her Profore wraps reasonably well. She is using the compression pumps 1 time per day. Measurements of her legs are better  in terms of the amount of lymphedema 02/21/16; patient tolerated her Profore wraps it. She says she is using her compression pumps one or 2 times a day. Measurements of her leg circumference are improved and most of her wound dimensions are better, unfortunately the home health that we referred her to did not except her insurance but works successful in getting her accompanied that would except her insurance however she apparently has a $25 co-pay which she cannot afford as she is on a fixed income 03/05/16; patient states she did not back using the pumps. She is healed that 2 wounds on the left leg. She still has areas on the right lateral lower leg, right medial lower leg and left lateral lower leg. All of these look some better and have reduced in area 03/13/16; still 3 wounds 1 on the left lateral leg, one on the left medial/posterior leg which is really the most extensive and a small area on the right lateral leg. All of this in the setting of severe chronic lymphedema and chronic venous inflammation. Damage to the skin with cobblestone thickened skin. 03/20/16; still 2 areas. The area on the left lateral leg appears to of closed over. The right medial leg is still most extensive wound although it appears to be closing over right lateral only a small open area remains. Using silver alginate Profore wraps 03/27/16; the area on the left leg remains closed. The edema control is better on the left than the right. The right medial leg is still weeping but everything appears to be epithelialized. The lateral aspect of the right leg appears closed. She tells me she has old juxtalite stockings. She has been compliant with her compression pumps. 04/03/16; the area on the left leg remains closed. She has ordered new juxtalite stockings. The right posterior medial wound is fully epithelialized. However she has an irritated area over the right Achilles that she states is been itching all week under the  wraps.. She has been compliant with her compression pumps 04/10/16; the area on the left leg remains closed. The right posterior medial wound is also closed today. The irritated area over the right Achilles which was probably wrap injury is also fully epithelialized and closed. The patient has not received her new juxtalite stockings, she arrives in clinic today with an old juxtalite stocking in place. Sheis using her compression pumps secondary to her severe stage III lymphedema READMISSION 01/21/17; this is a patient we know from several previous stays in this clinic. She has lymphedema with chronic venous insufficiency and inflammation. The last time she was here we discharged her with bilateral juxta lites which she is compliant with. She also has external compression pumps at home from stays in our clinic in 2014 although she tells me that she is not very compliant with these as when she uses them a causes pain in her bilateral knees the patient states that her wounds on her left greater than right leg including 2 large areas on the left anterior and left lateral leg and an area on the right leg open in late June or July since then she is been followed by [facility] and she's had bilateral Unna boot supplied every week for the last  month.. I think the wounds have actually improved although they are not specifically dressed. She states she had an ultrasound to rule out DVT bilaterally that was negative. She does not have arterial studies although she is a diabetic. She states she has a lot of pain in her legs she states she does not use her external compression pumps because it causes pain in her knees. As usual her ABIs were noncompressible bilaterally in this clinic 01/28/17; currently the patient only has one small wound on the left lateral leg and I think this should be healed next week. We are going to try to get her bilateral extremitease stockings. She tells me that compression pumps  heard her posterior rib arthritic knees so she is not been compliant with this 02/04/17; the patient's wounds are totally healed. She has chronic venous insufficiency and secondary lymphedema. She has new extremitease stockings. She also has compression pumps Readmission: 04/21/17 on evaluation today patient appears to be doing about the same as what she was previous when we were evaluating her back in November 2018. At that point she had completely healed. With that being said she has now reopened and states that she wasn't aware that she was supposed to come back. 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 Subsequently she also states that although she has the compression that we got for her previously the extremitease stockings she nonetheless doesn't always wear these and being noncompliant often has things will reopen as far as ulcers on her lower extremities. No fevers, chills, nausea, or vomiting noted at this time. Patient has no evidence of dementia. She is having some discomfort in regard to the ulcers at this point. 04/28/17 on evaluation today patient appears to be doing significantly better in regard to her wounds. With that being said she is still having significant discomfort and is not really keen on sharp debridement. She does not appear to have any evidence of infection which is good news I do think the Iodoflex is beneficial for her. Of note her ABI's were found in the system to be on the right 1.21 with a TBI of 0.76 and on the left 1.35 with a TBI of 0.75. In general her swelling appears to be significantly improved however. No fevers, chills, nausea, or vomiting noted at this time. Patient has been tolerating the wraps without complication. She has no evidence of dementia. 05/05/17-she is here in follow-up evaluation for bilateral lower extremity venous and lymphedema  ulcers. She states she has not been using her lymphedema pumps secondary to pain at her knees. After discussion it was noted that she uses knee-high lymphedema pumps but does have a pair of thigh-high lymphedema pumps. She was strongly advised to use the thigh-high lymphedema pumps and if necessary reduce the setting for improved tolerance. She reports no change in odor and/or drainage. She continues to tolerate sharp debridement poorly with significant pain, primarily to the right lower extremity. We will continue with current treatment plan, along with improved lymphedema pump use and follow-up next week 05/12/17 on evaluation today patient actually appears to be doing much better in regard to her bilateral lower extremity ulcers. Only the right altar actually requires debridement today. The other two cleaned off nicely in two of the three in fact on the left lower extremity or actually almost completely healed. She does have some weeping noted still the bilateral lower  extremities left greater than right. She still is not able to use her compression pumps even though I have mentioned this several times to her as did Leah last week. No fevers, chills, nausea, or vomiting noted at this time. Patient has been tolerating the dressing changes without complication. Patient has no evidence of dementia 05/19/17 on evaluation today patient appears to be doing very well regard to her bilateral lower extremity ulcers. In fact she seems to be improving and in fact healed in a couple of locations that were giving her trouble last week. Overall I'm extremely pleased with how things have progressed. She still has some discomfort especially in the right lateral lower extremity. Fortunately there does not appear to be any evidence of significant infection which is good news she does tell me that she has used her compression pumps several times although not routinely over the past week I did praise her for this. I  do think it will be beneficial and that might even be what's helping with her wounds as far as healing is concerned at least to a degree. No fevers, chills, nausea, or vomiting noted at this time. Patient has no dementia and no evidence of infection. 05/26/17 on evaluation today patient presents for evaluation concerning her bilateral lower extremity ulcer secondary to lymphedema. Fortunately she appears to be doing very well at this point in time. Specifically her right leg appears to be completely closed and healed. The left leg though there is still a small area of opening overall has healed. She does have a little bit of excoriation at the top or the wraps are because they slid down on her. Otherwise there's no evidence of infection and the other has improved dramatically now that she is not draining as significantly. Patient is having no significant pain she tells me she has been able to use her lymphedema pumps one time a day although that's not all she can tolerate. Even that is difficult for her. 06/02/17 on evaluation today patient's ulcers appear to be completely closed at this point. She does still note some odor but I believe this is due to the fact she has not been able to wash her legs as well currently. Fortunately she did bring her compression with her today. She does have the EXTREMIT-EASE Compression. With that being said she is somewhat nervous about going back to this as previously she broke out yet again once we discontinue wrapping her. READMISSION 11/23/17; patient is now 74 year old diabetic woman with severe chronic venous insufficiency with lymphedema. We care for her last in this clinic in February 2019 at which time she had chronic venous insufficiency with lymphedema bilateral lower extremity ulcers. We discharged her with bilateral wrap around/extremitease stockings and compression pumps. Is fairly clear she is not using the compression pumps stating it causes pain in  her knees. In any case she was admitted to hospital from 8/3 through 8/6 with a several week history of recurrent bilateral ulcers. She was felt to have bilateral cellulitis treated with doxycycline IV. She is still on oral doxycycline and has another 2 days worth. At discharge her white count was 14.1 hemoglobin at 8.9. The patient is a diabetic however her last ABIs in 2018 showed a right ABI of 1.21, left of 1.35. TBI's were 0.76 on the right 0.75 on the left. Posterior tibial artery was monophasic on the right triphasic on the left. Dorsalis pedis triphasic on the right and triphasic on the left. She is not felt  to have significant macrovascular disease 12/02/17;significant bilateral lower extremity wounds secondary to severe uncontrolled lymphedema. She did not get home health out to change the wraps. So she kept this on all week. She is telling me she using his her compression pumps once a week. In general all the wounds look better. We've been using silver alginate with secondary absorptive dressings 4 layer compression 12/10/17; bilateral lower extremity wounds secondary to uncontrolled lymphedema. She tells me she is using her compression pumps on most days. We've been keeping wraps on all week. Using silver alginate. She has wounds on the right lateral calf left medial and left lateral calf. All of this is somewhat better 12/16/17; the patient has 3 small open areas. One on the right lateral calf, one on the left medial calf and the small area on the left lateral calf. She has lymphedema, chronic venous insufficiency with hemosiderin deposition. Very fibrotic distal lower extremity scan with suggestion of an inverted bottle sign appearance to her legs. She has compression pumps but she does not use them has not used them in the last week. Currently we have her in 4 layer compression, silver alginate to the wounds and this is being changed once by home health. She is noncompliant with the  compression pumps because she says it hurts her knees. She also has compression stockings ando Juxta light stockings at home 12/24/17; the patient's right lateral calf is healed as well as the left medial. There is no open wound on the right leg. She still has 2 areas on the left lateral calf. We transitioned the right leg into an extremitease stocking which she brought in from home. 12/31/17; the patient has a superficial wound on the left lateral lower calf. We've been wrapping this leg. She arrives today with the right leg swollen but without a wound. She cannot get her extremities stocking on the right leg. There is a lot more edema here. She is not using her compression pumps 01/10/2018; patient has a same superficial wound on the left lateral calf. Her right leg has less peripheral edema. She tells me she also started herself on some Lasix that had been previously prescribed for her at some point but she had never taken it. She is also concerned about whether her compression pumps are leaking. She has extremitease stockings however the edema gets bad enough she can get these on. Currently she has no open wound on the right leg and is mention the edema is actually better than when I saw this 10 days ago 01/17/2018; patient has a same superficial area on the posterior left calf and a weeping area in the mid part of the right lateral calf. She still does not have anyone from [facility] coming to see her compression pumps which she describes as being suspicious for a leakage. I am not really sure how frequently she is using this in any case. She has massive bilateral calf lymphedema. I think there is some spreading lymphedema into her posterior thighs. 01/24/2018; the patient's edema in her legs is actually some better. She still has a smaller area on the left posterior calf although this is better. The weeping area on the right lateral calf is also better. As it turned out she did not  get her pumps from [facility] but [facility] and they are going to try to go out and see these this week which is going to help. I explained to her that she will use the compression pumps  once a day while we are wrapping her legs but when she transitions to stockings that may be she will require pumping twice a day. Her compliance in this area has not been high and I wonder about her ability to do this herself. 02/01/2018; the area on the posterior left leg is healed. She still has the original wound on the right lateral calf which is epithelialized. The however just above this there is still weeping lymphedema fluid. She has a new area on the upper right calf which is either wrap injury or is she points out where she has been scratching under the wraps. We still not have managed to get a hold of a representative of [facility] to see if the pump is operational. She has extremitease stockings at home however I am not of the opinion that this will maintain her skin integrity without the compression pumps. 02/08/2018; posterior left leg remains healed although she has a new small open area on the left lateral calf. Right lateral calf has two quarter sized open areas and close juxtaposition. There is less weeping edema fluid. She apparently managed to contact [facility]. They are sending her a part. But she is still not received it 02/15/2018; the patient has no open wound on either leg. Some scale present on the right upper lateral calf just below the fibular head. Edema control is excellent. She did not bring her stockings or her juxta lite wrap around. She has the part for her compression pump but she has not use the compression pumps yet READMISSION 01/10/2019 Patient is now a 74 year old woman that we have had in the clinic multiple times in the past. She has chronic lower extremity lymphedema, recurrent wounds on her bilateral lower extremities. She is also a type II  diabetic reasonably well controlled with a recent hemoglobin A1c while she was in the hospital of 5.9. She has wounds on her bilateral lower extremities mostly on the left lateral and right lateral. These were noted also during her recent hospitalization. The patient states they have been there for a while. She has not been able to wear her stockings he has trouble getting them on as she lives alone. She also has external compression pumps but uses them sparingly because it causes pain in her knees which hurt because of osteoarthritis. The patient was on doxycycline before she went into the hospital apparently for fear of infection in her legs and is on Keflex coming out because of a UTI The patient was recently in the hospital from 01/06/2019 through 01/09/2019. She was admitted with nausea and vomiting and prerenal azotemia. This was corrected with IV fluid her Lasix has been put on hold. Hemoglobin A1c at 5.9 her metformin was discontinued. She was also felt to have a UTI she was prescribed Keflex at 503 times a day she is picking that up today. The patient is not felt to have an arterial issue. ABIs in our clinic were 1.24 on the right and 1.06 on the left. She had formal studies in 2018 at which time her ABIs were 1.21 on the right TBI of 0.76 and on the left 1.35 and 0.75 respectively. Waveforms are on the right were monophasic and biphasic, triphasic and biphasic on the left. She is tolerated 4 layer compression in the past 10/13. Comes in today having not used her compression pumps /perhaps once in the last week. She has too much edema to consider healing these wounds. We have been using silver alginate  10/20; she tells me he had she used her compression pumps once a day as we asked. Clearly she has edema out of her legs although most of it seems to be settling around her knees which is what she complains about. We have been using silver alginate to the extensive de-epithelialized area on  the left lateral calf and a much smaller area on the right lateral extending linearly into the right posterior 10/27; she is using her compression pumps daily. Her edema control is better. We have been using silver alginate to the extensive wound areas on her bilateral lower legs. This includes left anterior left lateral and left posterior. Right anterior right lateral and right posterior. The wounds are not all in the same condition. She has severe bilateral skin damage with the epithelialization, flaking dried epithelium 11/3; she is using her compression pumps 1 times daily. Pretty obvious that her wraps fell down especially on the left leg to mid calf. I think things are improving on the right lateral calf, I do not see anything open posteriorly. The major areas on the left lateral where she has 3 or 4 deeper wounds in the midst of an entire de-epithelialized area. There is erythema here but no tenderness I think this represents venous stasis rather than cellulitis. I debrided the areas on the left lateral calf last week but once again they have tightly adherent debris over the top of them which is disappointing 11/10; the patient's major areas on the left lateral calf to 3 wounds with some depth surrounded by a large area of de-epithelialized tissue. There is no infection. We have been using silver alginate under 4-layer compression. On the right lateral she has a more contained wound area that is superficial. Been using silver alginate under 4-layer compression 11/17; the patient's major wound is on the left lateral calf. We have been using silver alginate under compression. There is no open wound per se on the right but it de-epithelialized area on the lateral calf that is weeping edema fluid. I do not see much change here today. 12/1; patient arrives in clinic with her wounds looking better. Her edema control is better. The lymphedema specialist working on the left leg we have been  working on the right. We have been using 4-layer compression. The patient is using a consider external compression pumps and Hydrofera Blue to the open areas. She is being measured for an abdominal compression pump and apparently that is going to go through her insurance 12/15; continued improvement. 2 small areas on the left and slightly larger 2 small areas on the right. 12/22; patient still has open areas on the right lateral calf which are superficial and the new one on the right anterior calf. She did not use her compression pumps this week. Her left leg is wrapped by our lymphedema specialist. There are no open wounds here 12/29; she still has a superficial area on the right lateral calf and area on the right medial calf. Right anterior wound area appears to have closed. She has much better edema control today than last week 04/11/2019; the patient is here with severe bilateral lymphedema, chronic venous insufficiency with marked skin changes in her lower extremities. We are wrapping her right leg and her lymphedema specialist is putting lymphedema wraps on the left. She states she is using her compression pumps although in the past her compliance with uses been limited because of osteoarthritic pain in her knees. She only has one remaining area on the  right leg however she has wrap injuries on the left at the upper level what where I think her wraps were cause. the left lateral area left posterior. These would be new this week 1/12; generally better. The wrap injuries in the upper calf on the left from last week are down to a single wound. She has 2 wounds anteriorly that are still weeping fluid although these appear to have been epithelialized surface for the most part. There is no open wounds on the right leg we are wrapping her right leg and 4-layer compression and we are going a lymphedema wrap on the left leg. The the approach is a [place] 2000 wrap on the left leg and then subsequently  the right leg. She claims to be compliant with her compression pumps 1/19; the patient has 1 tiny open area on the right medial calf. The rest of this is not open. She however has a large erythematous denuded area on the left anterior tibia extending laterally. There is no evidence of infection here. She has her new current waist high compression pumps 1/26; the patient has no open wounds on the right. On the left anteriorly much better than last week still a smattering of small weeping open areas. There is no evidence of infection 2/2; still no open wounds on the right leg. He still does not have a external compression garment. She arrives in clinic today with an open area on the plantar aspect of the left second toe at the base of the PIP. This apparently was painful last week so it was not wrapped by our lymphedema specialist. There was no open wound at the time. The patient denies traumatizing the toe however today she arrives with a small but deep wound in this area the dorsal surface of the toe is very tender. Objective Constitutional Patient is hypertensive.. Pulse regular and within target range for patient.Marland Kitchen Respirations regular, non-labored and within target range.. Temperature is normal and within the target range for the patient.Marland Kitchen Appears in no distress. Vitals Time Taken: 1:45 PM, Height: 71 in, Source: Stated, Weight: 240 lbs, Source: Stated, BMI: 33.5, Temperature: 98.2 F, Pulse: 61 bpm, Respiratory Rate: 18 breaths/min, Blood Pressure: 147/71 mmHg. General Notes: Wound exam; the patient has nothing open on the right leg. ooOn the left leg anteriorly there is still areas of denuded skin x2 in the mid tibia area. ooMost problematically this week a small but deep wound on the plantar base of the left second PIP. Very difficult to get a look at this as she has a hammer deformity. There is a lot of tenderness. Integumentary (Hair, Skin) Wound #39 status is Open. Original cause of  wound was Gradually Appeared. The wound is located on the Left,Anterior Lower Leg. The wound measures 4cm length x 3cm width x 0.1cm depth; 9.425cm^2 area and 0.942cm^3 volume. The wound is limited to skin breakdown. There is no tunneling or undermining noted. There is a medium amount of serous drainage noted. The wound margin is indistinct and nonvisible. There is large (67-100%) pink granulation within the wound bed. There is no necrotic tissue within the wound bed. Wound #41 status is Open. Original cause of wound was Gradually Appeared. The wound is located on the Left,Plantar Toe Second. The wound measures 0.3cm length x 0.5cm width x 0.3cm depth; 0.118cm^2 area and 0.035cm^3 volume. There is Fat Layer (Subcutaneous Tissue) Exposed exposed. There is no tunneling or undermining noted. There is a medium amount of serous drainage noted. The wound  margin is epibole. There is large (67-100%) red granulation within the wound bed. There is no necrotic tissue within the wound bed. General Notes: macerated wound edges Assessment Active Problems ICD-10 Lymphedema, not elsewhere classified Non-pressure chronic ulcer of left calf limited to breakdown of skin Non-pressure chronic ulcer of other part of left foot with other specified severity Non-pressure chronic ulcer of right calf limited to breakdown of skin Type 2 diabetes mellitus with other skin ulcer Chronic venous hypertension (idiopathic) with inflammation of bilateral lower extremity Procedures There was a Four Layer Compression Therapy Procedure by Carlene Coria, RN. Post procedure Diagnosis Wound #: Same as Pre-Procedure Plan Follow-up Appointments: Return Appointment in 1 week. Nurse Visit: - Thursday for Lymphedema Dressing Change Frequency: Wound #39 Left,Anterior Lower Leg: Do not change entire dressing for one week. Wound #41 Left,Plantar Toe Second: Do not change entire dressing for one week. Skin Barriers/Peri-Wound  Care: Wound #39 Left,Anterior Lower Leg: Barrier cream Wound Cleansing: May shower and wash wound with soap and water. Primary Wound Dressing: Wound #39 Left,Anterior Lower Leg: Calcium Alginate with Silver Wound #41 Left,Plantar Toe Second: Calcium Alginate with Silver Secondary Dressing: Wound #39 Left,Anterior Lower Leg: Kerlix/Rolled Gauze - PT to apply lymphedema wrap over dressing ABD pad Wound #41 Left,Plantar Toe Second: Kerlix/Rolled Gauze Dry Gauze Edema Control: 4 layer compression - Right Lower Extremity - unna boot at top to help secure wrap Other: - left leg: PT to apply lymphedema wraps The following medication(s) was prescribed: doxycycline monohydrate oral 100 mg capsule 1 capsule oral bid for 7 days for infection left 2nd toe starting 05/09/2019 1. Continue with silver alginate to the wounds on the left leg with lymphedema wrap. 2. Silver alginate to the left second toe with gauze will change this with a nurse visit 3. I am hoping this is cellulitis only on the left second toe. If not I may need to try and x-ray this area although it would be not easy to do. I am going to give her empiric doxycycline Electronic Signature(s) Signed: 05/09/2019 2:37:31 PM By: Linton Ham MD Entered By: Linton Ham on 05/09/2019 14:37:31 -------------------------------------------------------------------------------- SuperBill Details Patient Name: Date of Service: SOOKIE, TONA 05/09/2019 Medical Record [id number] Patient Account Number: [id number] Date of Birth/Sex: Treating RN: [birth year] (75 y.o. [name] Primary Care Provider: [name] Other Clinician: Referring Provider: Treating Provider/Extender:, Tenna Child, [place] Siren in Treatment: 17 Diagnosis Coding ICD-10 Codes Code Description I89.0 Lymphedema, not elsewhere classified L97.221 Non-pressure chronic ulcer of left calf limited to breakdown of skin L97.528  Non-pressure chronic ulcer of other part of left foot with other specified severity L97.211 Non-pressure chronic ulcer of right calf limited to breakdown of skin E11.622 Type 2 diabetes mellitus with other skin ulcer I87.323 Chronic venous hypertension (idiopathic) with inflammation of bilateral lower extremity Facility Procedures CPT4 Code Description: [id number] (Facility Use Only) [phone] - APPLY [place] LWR LT LEG Modifier: Quantity: 1 Physician Procedures CPT4 Code Description: [id number] - WC PHYS LEVEL 3 - EST PT ICD-10 Diagnosis Description L97.221 Non-pressure chronic ulcer of left calf limited to break L97.528 Non-pressure chronic ulcer of other part of left foot wi E11.622 Type 2 diabetes  mellitus with other skin ulcer Modifier: down of skin th other specified Quantity: 1 severity Electronic Signature(s) Signed: 05/09/2019 5:22:39 PM By: Linton Ham MD Entered By: Linton Ham on 05/09/2019 14:38:10

## 2019-05-09 NOTE — Progress Notes (Addendum)
[name] [name], [name] [name] ([id number]) Visit Report for 05/09/2019 Arrival Information Details Patient Name: Date of Service: [name] [name], [name] [name] 05/09/2019 1:15 PM Medical Record [id number] Patient Account Number: [id number] Date of Birth/Sex: Treating RN: [birth year] (74 y.o. [name] [name] Primary Care Lareina Espino: [name] Other Clinician: Referring Menelik Mcfarren: Treating Samayah Novinger/Extender:Robson, Tenna Child, [place] Siren in Treatment: 17 Visit Information History Since Last Visit Walker Added or deleted any medications: No Patient Arrived: Any new allergies or adverse reactions: No Arrival Time: 13:45 Had a fall or experienced change in No Accompanied By: self activities of daily living that may affect Transfer Assistance: None risk of falls: Patient Identification Verified: Yes Signs or symptoms of abuse/neglect since last No Secondary Verification Process Completed: Yes visito Patient Requires Transmission-Based No Hospitalized since last visit: No Precautions: Implantable device outside of the clinic excluding No Patient Has Alerts: No cellular tissue based products placed in the center since last visit: Has Dressing in Place as Prescribed: Yes Has Compression in Place as Prescribed: Yes Pain Present Now: Yes Electronic Signature(s) Signed: 05/09/2019 5:43:30 PM By: Baruch Gouty RN, BSN Entered By: Baruch Gouty on 05/09/2019 13:45:50 -------------------------------------------------------------------------------- Compression Therapy Details Patient Name: Date of Service: [name] [name] 05/09/2019 1:15 PM Medical Record [id number] Patient Account Number: [id number] Date of Birth/Sex: Treating RN: 27-Oct-1945 (74 y.o. [name] [name] Primary Care Merriel Zinger: [name] Other Clinician: Referring Trevone Prestwood: Treating Abriel Hattery/Extender:Robson, Tenna Child, [place] Siren in Treatment: 17 Compression Therapy Performed for Wound NonWound  Condition Lymphedema - Right Leg Assessment: Performed By: Clinician Carlene Coria, RN Compression Type: Four Layer Post Procedure Diagnosis Same as Pre-procedure Electronic Signature(s) Signed: 05/09/2019 5:23:18 PM By: Carlene Coria RN Entered By: Carlene Coria on 05/09/2019 14:29:27 -------------------------------------------------------------------------------- Encounter Discharge Information Details Patient Name: Date of Service: [name] [name], [name] [name] 05/09/2019 1:15 PM Medical Record [id number] Patient Account Number: [id number] Date of Birth/Sex: Treating RN: [birth year] (74 y.o. [name] [name] Primary Care Piper Hassebrock: [name] Other Clinician: Referring Trevelle Mcgurn: Treating Dorothee Napierkowski/Extender:Robson, Tenna Child, [place] Siren in Treatment: 17 Encounter Discharge Information Items Discharge Condition: Stable Ambulatory Status: Walker Discharge Destination: Home Transportation: Other Accompanied By: self Schedule Follow-up Appointment: Yes Clinical Summary of Care: Patient Declined Electronic Signature(s) Signed: 05/09/2019 4:57:44 PM By: Kela Millin Entered By: Kela Millin on 05/09/2019 16:36:53 -------------------------------------------------------------------------------- Lower Extremity Assessment Details Patient Name: Date of Service: [name] [name], [name] [name] 05/09/2019 1:15 PM Medical Record [id number] Patient Account Number: [id number] Date of Birth/Sex: Treating RN: December 05, 1945 (74 y.o. [name] [name] Primary Care Skilynn Durney: [name] Other Clinician: Referring Valecia Beske: Treating Adonica Fukushima/Extender:Robson, Tenna Child, [place] Siren in Treatment: 17 Edema Assessment Assessed: [Left: No] [Right: No] Edema: [Left: Yes] [Right: Yes] Calf Left: Right: Point of Measurement: 30 cm From Medial Instep 50.4 cm 51.4 cm Ankle Left: Right: Point of Measurement: 8 cm From Medial Instep 30.8 cm 29 cm Vascular  Assessment Pulses: Dorsalis Pedis Palpable: [Left:Yes] [Right:Yes] Electronic Signature(s) Signed: 05/09/2019 5:43:30 PM By: Baruch Gouty RN, BSN Entered By: Baruch Gouty on 05/09/2019 14:05:28 -------------------------------------------------------------------------------- Multi-Disciplinary Care Plan Details Patient Name: Date of Service: [name] [name], [name] [name] 05/09/2019 1:15 PM Medical Record [id number] Patient Account Number: [id number] Date of Birth/Sex: Treating RN: 1945/05/09 (73 y.o. [name] [name] Primary Care Mercy Leppla: [name] Other Clinician: Referring Berta Denson: Treating Gaston Dase/Extender:Robson, Tenna Child, [place] Siren in Treatment: 17 Active Inactive Wound/Skin Impairment Nursing Diagnoses: Knowledge deficit related to ulceration/compromised skin integrity Goals: Patient/caregiver will verbalize understanding of skin care regimen Date Initiated: 01/10/2019 Target Resolution Date: 05/19/2019 Goal Status: Active Ulcer/skin breakdown will have a volume reduction of 30% by week 4 Target Resolution  Date Initiated: 01/10/2019 Date Inactivated: 02/14/2019 Date: 02/10/2019 Unmet Goal Status: Unmet Reason: comorbities/lymphedema Ulcer/skin breakdown will have a volume reduction of 50% by week 8 Target Resolution Date Initiated: 02/14/2019 Date Inactivated: 03/21/2019 Date: 03/17/2019 Unmet Goal Status: Unmet Reason: comorbities/lymphedema Ulcer/skin breakdown will have a volume reduction of 80% by week 12 Target Resolution Date Initiated: 03/21/2019 Date Inactivated: 04/25/2019 Date: 04/21/2019 Unmet Goal Status: Unmet Reason: comorbities/lymphedema Ulcer/skin breakdown will heal within 14 weeks Date Initiated: 04/25/2019 Target Resolution Date: 05/26/2019 Goal Status: Active Interventions: Assess patient/caregiver ability to obtain necessary supplies Assess patient/caregiver ability to perform ulcer/skin care regimen upon admission and as  needed Assess ulceration(s) every visit Notes: Electronic Signature(s) Signed: 05/09/2019 5:23:18 PM By: Carlene Coria RN Entered By: Carlene Coria on 05/09/2019 14:12:06 -------------------------------------------------------------------------------- Pain Assessment Details Patient Name: Date of Service: [name] [name], [name] [name] 05/09/2019 1:15 PM Medical Record [id number] Patient Account Number: [id number] Date of Birth/Sex: Treating RN: 05/07/1945 (74 y.o. [name] [name] Primary Care Marlina Cataldi: [name] Other Clinician: Referring Shir Bergman: Treating Peyton Rossner/Extender:Robson, Tenna Child, [place] Siren in Treatment: 17 Active Problems Location of Pain Severity and Description of Pain Patient Has Paino Yes Site Locations Pain Location: Generalized Pain With Dressing Change: No Rate the pain. Current Pain Level: 0 Worst Pain Level: 6 Least Pain Level: 0 Character of Pain Describe the Pain: Aching Pain Management and Medication Current Pain Management: Rest: Yes Other: tolerable Is the Current Pain Management Adequate: Adequate How does your wound impact your activities of daily livingo Sleep: No Bathing: No Appetite: No Relationship With Others: No Bladder Continence: No Emotions: No Bowel Continence: No Work: No Toileting: No Drive: No Dressing: No Hobbies: No Electronic Signature(s) Signed: 05/09/2019 5:43:30 PM By: Baruch Gouty RN, BSN Entered By: Baruch Gouty on 05/09/2019 13:47:52 -------------------------------------------------------------------------------- Patient/Caregiver Education Details Patient Name: Date of Service: [name] [name], [name] [name] 2/2/2021andnbsp1:15 PM Medical Record [id number] Patient Account Number: [id number] Date of Birth/Gender: Treating RN: Aug 23, 1945 (74 y.o. [name] [name] Primary Care Physician: [name] Other Clinician: Referring Physician: Treating Physician/Extender:Robson, Tenna Child,  [place] Siren in Treatment: 17 Education Assessment Education Provided To: Patient Education Topics Provided Wound/Skin Impairment: Methods: Explain/Verbal Responses: State content correctly Electronic Signature(s) Signed: 05/09/2019 5:23:18 PM By: Carlene Coria RN Entered By: Carlene Coria on 05/09/2019 14:12:21 -------------------------------------------------------------------------------- Wound Assessment Details Patient Name: Date of Service: [name] [name], [name] [name] 05/09/2019 1:15 PM Medical Record [id number] Patient Account Number: [id number] Date of Birth/Sex: Treating RN: January 31, 1946 (74 y.o. [name] [name] Primary Care Williette Loewe: [name] Other Clinician: Referring Denee Boeder: Treating Elsbeth Yearick/Extender:Robson, Tenna Child, [place] Si[place] in Treatment: 17 Wound Status Wound Number: [id number] Primary Lymphedema Etiology: Wound Location: Left Lower Leg - Anterior Wound Open Wounding Event: Gradually Appeared Status: Date Acquired: 04/11/2019 Comorbid Cataracts, Lymphedema, Arrhythmia, Weeks Of Treatment: 4 History: Congestive Heart Failure, Hypertension, Clustered Wound: No Peripheral Venous Disease, Type II Diabetes, Osteoarthritis, Neuropathy, Confinement Anxiety Photos Wound Measurements Length: (cm) 4 % Reduction in Width: (cm) 3 % Reduction in Depth: (cm) 0.1 Epithelializati Area: (cm) 9.425 Tunneling: Volume: (cm) 0.942 Undermining: Wound Description Classification: Partial Thickness Foul Odor After Wound Margin: Indistinct, nonvisible Slough/Fibrino Exudate Amount: Medium Exudate Type: Serous Exudate Color: amber Wound Bed Granulation Amount: Large (67-100%) Granulation Quality: Pink Fascia Exposed: Necrotic Amount: None Present (0%) Fat Layer (Subc Tendon Exposed: Muscle Exposed: Joint Exposed: Bone Exposed: Limited to Skin Cleansing: No Yes Exposed Structure No utaneous Tissue) Exposed: No No No No No Breakdown Area:  -5903.2% Volume: -5787.5% on: Large (67-100%) No No Treatment Notes Wound #39 (Left, Anterior Lower Leg) 1. Cleanse  With Wound Cleanser Soap and water 2. Periwound Care Barrier cream 3. Primary Dressing Applied Calcium Alginate Ag 4. Secondary Dressing Dry Gauze Roll Gauze Notes Lymphedema wrap applied by PT. 4 layer applied to right lower leg. silver alginate/abd/kerlix applied to left Electronic Signature(s) Signed: 05/10/2019 4:30:25 PM By: Mikeal Hawthorne EMT/HBOT Signed: 05/12/2019 5:25:54 PM By: Carlene Coria RN Previous Signature: 05/09/2019 5:43:30 PM Version By: Baruch Gouty RN, BSN Entered By: Mikeal Hawthorne on 05/10/2019 14:39:16 -------------------------------------------------------------------------------- Wound Assessment Details Patient Name: Date of Service: [name] [name], [name] [name] 05/09/2019 1:15 PM Medical Record [id number] Patient Account Number: [id number] Date of Birth/Sex: Treating RN: 1946-02-26 (74 y.o. [name] [name] Primary Care Aarnav Steagall: [name] Other Clinician: Referring Alcides Nutting: Treating Princesa Willig/Extender:Robson, Tenna Child, [place] Si[place] in Treatment: 17 Wound Status Wound Number: 41 Primary Diabetic Wound/Ulcer of the Lower Extremity Etiology: Wound Location: Left Toe Second - Plantar Wound Open Wounding Event: Gradually Appeared Status: Date Acquired: 04/07/2019 Comorbid Cataracts, Lymphedema, Arrhythmia, Weeks Of Treatment: 0 History: Congestive Heart Failure, Hypertension, Clustered Wound: No Peripheral Venous Disease, Type II Diabetes, Osteoarthritis, Neuropathy, Confinement Anxiety Photos Wound Measurements Length: (cm) 0.3 % Red Width: (cm) 0.5 % Red Depth: (cm) 0.3 Epith Area: (cm) 0.118 Tunn Volume: (cm) 0.035 Unde Wound Description Classification: Grade 1 Foul Wound Margin: Epibole Slou Exudate Amount: Medium Exudate Type: Serous Exudate Color: amber Wound Bed Granulation Amount: Large  (67-100%) Granulation Quality: Red Fascia Exp Necrotic Amount: None Present (0%) Fat Layer Tendon Exp Muscle Exp Joint Expo Bone Expos Odor After Cleansing: No gh/Fibrino No Exposed Structure osed: No (Subcutaneous Tissue) Exposed: Yes osed: No osed: No sed: No ed: No uction in Area: 0% uction in Volume: 0% elialization: None eling: No rmining: No Assessment Notes macerated wound edges Electronic Signature(s) Signed: 05/10/2019 4:30:25 PM By: Mikeal Hawthorne EMT/HBOT Signed: 05/10/2019 6:36:45 PM By: Baruch Gouty RN, BSN Previous Signature: 05/09/2019 5:43:30 PM Version By: Baruch Gouty RN, BSN Entered By: Mikeal Hawthorne on 05/10/2019 14:39:41 -------------------------------------------------------------------------------- Vitals Details Patient Name: Date of Service: [name] [name], [name] [name] 05/09/2019 1:15 PM Medical Record [id number] Patient Account Number: [id number] Date of Birth/Sex: Treating RN: 1945-04-09 (74 y.o. [name] [name] Primary Care Nello Corro: [name] Other Clinician: Referring Eloni Darius: Treating Jamillia Closson/Extender:Robson, Tenna Child, [place] Siren in Treatment: 17 Vital Signs Time Taken: 13:45 Temperature (F): 98.2 Height (in): 71 Pulse (bpm): 61 Source: Stated Respiratory Rate (breaths/min): 18 Weight (lbs): 240 Blood Pressure (mmHg): 147/71 Source: Stated Reference Range: 80 - 120 mg / dl Body Mass Index (BMI): 33.5 Electronic Signature(s) Signed: 05/09/2019 5:43:30 PM By: Baruch Gouty RN, BSN Entered By: Baruch Gouty on 05/09/2019 13:47:08

## 2019-05-09 NOTE — Therapy (Signed)
[place], [place], [address] Phone: [phone]   Fax:  [phone]  Physical Therapy Treatment Progress Note Reporting Period 04/04/19 to 05/09/19  See note below for Objective Data and Assessment of Progress/Goals.     Patient Details  Name: [name] [name] MRN: [id number] Date of Birth: Aug 07, 1945 Referring Provider (PT): Darci Current   Encounter Date: 05/09/2019  PT End of Session - 05/09/19 1412    Visit Number  19    Number of Visits  29    Date for PT Re-Evaluation  06/20/19    PT Start Time  1450    PT Stop Time  1520    PT Time Calculation (min)  30 min    Activity Tolerance  Patient tolerated treatment well    Behavior During Therapy  [facility] for tasks assessed/performed       Past Medical History:  Diagnosis Date  . Acute kidney injury ([place])   . Aortic stenosis, mild 11/17/2013  . Arthritis    "both knees, spine, back, fingers" (11/29/2013)  . CHF (congestive heart failure) ([place])   . Edema   . GERD (gastroesophageal reflux disease)   . Heart murmur   . History of diastolic dysfunction    Echo [id number] with diastolic dysfunction  . HTN (hypertension)   . Morbid obesity ([place])   . OA (osteoarthritis)   . PAC (premature atrial contraction)    per prior Holter  . Palpitations   . Pneumonia    "as a child"  . PVC's (premature ventricular contractions)    per prior Holter  . SVT (supraventricular tachycardia) ([place])   . Tachycardia    noted at 07/28/11 visit. Started on beta blocker. Possible atrial flutter vs long PR tachycardia/AVNRT  . Type II diabetes mellitus ([place])     Past Surgical History:  Procedure Laterality Date  . CESAREAN SECTION  1985  . CESAREAN SECTION  1985  . SUPRAVENTRICULAR TACHYCARDIA ABLATION  11/29/2013  . SUPRAVENTRICULAR TACHYCARDIA ABLATION N/A 11/29/2013   Procedure: SUPRAVENTRICULAR TACHYCARDIA ABLATION;  Surgeon: Evans Lance, MD;  Location: [facility] CATH  LAB;  Service: Cardiovascular;  Laterality: N/A;  . US ECHOCARDIOGRAPHY  07/25/2008   EF 55-60%  . VAGINAL DELIVERY      There were no vitals filed for this visit.  Subjective Assessment - 05/09/19 1451    Subjective  Pt reports that she has only used her pump 1x since her last visit.    Pertinent History  osteo arthritis, hx cellulitis, phlebitis and history of edema in BLE, Hx Acute kidney injury, CHF and DM II    Patient Stated Goals  I want to get this swelling down and heal my wound.    Currently in Pain?  Yes    Pain Score  6     Pain Location  Knee    Pain Orientation  Right;Left    Pain Descriptors / Indicators  Aching    Pain Type  Chronic pain    Pain Onset  More than a month ago    Aggravating Factors   movement    Pain Relieving Factors  rest            LYMPHEDEMA/ONCOLOGY QUESTIONNAIRE - 05/09/19 1452      Left Lower Extremity Lymphedema   20 cm Proximal to Suprapatella  65 cm    10 cm Proximal to Suprapatella  65 cm    At Midpatella/Popliteal Crease  49.7 cm    30 cm  Proximal to Floor at Lateral Plantar Foot  48.9 cm    20 cm Proximal to Floor at Lateral Plantar Foot  38.4 cm    10 cm Proximal to Floor at Lateral Malleoli  29.3 cm    5 cm Proximal to 1st MTP Joint  23.8 cm    Around Proximal Great Toe  10 cm                [facility] Adult PT Treatment/Exercise - 05/09/19 0001      Manual Therapy   Manual Therapy  Edema management;Compression Bandaging    Edema Management  discussed importance of using pump at home 1x/day     Compression Bandaging  Pt wrapped this session using tubigrip as base layer with 3 artiflex for shaping. 2 8 cm for roman sandal and ankle/sole/heel, 3 10 cm for spiral wrap and 1 12 cm for thigh. Ironing throughout to prevent wrinkles no significant pull this session due to pt had good reduction w/o much pressure.             PT Education - 05/09/19 1520    Education Details  Pt was encouraged and educated on why  pumping every day is good for her to decrease edema. She will try to pump 1x/day until her next visit.    Person(s) Educated  Patient    Methods  Explanation    Comprehension  Verbalized understanding       PT Short Term Goals - 04/04/19 1515      PT SHORT TERM GOAL #1   Title  Pt will be independent with HEP    Baseline  Pt is working on her HEP occasionally at home.    Time  5    Period  Weeks    Status  On-going        PT Long Term Goals - 04/04/19 1515      PT LONG TERM GOAL #1   Title  Patient will have reduction of L lower leg limb girth by 3 cm    Baseline  Pt will be measuremed next session she has significantly reduced from her initial evaluation.    Time  5    Status  On-going    Target Date  05/23/19      PT LONG TERM GOAL #2   Title  Patient to be properly fitted with compression garment to wear on daily basis.    Baseline  Pt has a juxtalite that is too small and old that ht evelcro is no longer usable on. Trying to get her a [employer].    Time  5    Period  Weeks    Status  On-going    Target Date  05/23/19      PT LONG TERM GOAL #3   Title  Patient will be independent in self-care management principles including self-massage/bandaging and long term management plan for edema.    Baseline  Pt has difficulty with managing her lymphedema. Trying to ger her a pump to help with swelling.    Time  5    Period  Weeks    Status  On-going    Target Date  05/23/19            Plan - 05/09/19 1450    Personal Factors and Comorbidities  Age;Comorbidity 3+    Comorbidities  CHF, acute kidney injury, DM II    Rehab Potential  Good    PT Frequency  2x / week  PT Duration  12 weeks    PT Treatment/Interventions  Electrical Stimulation;Cryotherapy;Gait training;Stair training;Functional mobility training;Therapeutic activities;Therapeutic exercise;Balance training;Neuromuscular re-education;Patient/family education;Manual lymph drainage;Compression  bandaging;Passive range of motion;Taping;Vasopneumatic Device    PT Next Visit Plan  ask if she has used her pump. Assess wrap, give exercises, assess self MLD and possible work out pants?    PT Home Exercise Plan  pump and exercises    Consulted and Agree with Plan of Care  Patient       Patient will benefit from skilled therapeutic intervention in order to improve the following deficits and impairments:  Difficulty walking, Increased edema  Visit Diagnosis: Lymphedema  Other abnormalities of gait and mobility     Problem List Patient Active Problem List   Diagnosis Date Noted  . Postnasal drip 11/16/2017  . Sensorineural hearing loss (SNHL), bilateral 11/16/2017  . Tinnitus of both ears 11/16/2017  . Obesity, Class II, BMI 35-39.9, isolated (see actual BMI) 11/08/2017  . CKD (chronic kidney disease), stage III 11/08/2017  . Anemia 11/07/2017  . Thrombocytosis ([place]) 11/07/2017  . Lymphedema 11/07/2017  . Routine general medical examination at a health care facility 05/10/2014  . Paroxysmal SVT (supraventricular tachycardia) ([place]) 11/28/2013  . Physical deconditioning 11/23/2013  . Generalized weakness 11/17/2013  . Nausea 11/17/2013  . Multilevel spine pain 11/17/2013  . Aortic stenosis, mild 11/17/2013  . DM type 2 (diabetes mellitus, type 2) ([place]) 02/02/2013  . Arthritis 10/19/2012  . Mixed incontinence 10/19/2012  . GERD (gastroesophageal reflux disease) 04/20/2011  . HTN (hypertension) 01/20/2011  . Morbid obesity [facility])     Ander Purpura, PT 05/09/2019, 3:26 PM  [place], [place], [address] Phone: [phone]   Fax:  [phone]  Name: [name] [name] MRN: [id number] Date of Birth: 1945/06/18

## 2019-05-11 ENCOUNTER — Other Ambulatory Visit: Payer: Self-pay

## 2019-05-11 ENCOUNTER — Ambulatory Visit: Payer: PPO

## 2019-05-11 DIAGNOSIS — I890 Lymphedema, not elsewhere classified: Secondary | ICD-10-CM | POA: Diagnosis not present

## 2019-05-11 DIAGNOSIS — R2689 Other abnormalities of gait and mobility: Secondary | ICD-10-CM

## 2019-05-11 NOTE — Therapy (Signed)
[place], [place], [address] Phone: [phone]   Fax:  [phone]  Physical Therapy Treatment  Patient Details  Name: [name] [name] MRN: [id number] Date of Birth: April 13, 1945 Referring Provider (PT): Darci Current   Encounter Date: 05/11/2019  PT End of Session - 05/11/19 1455    Visit Number  20    Number of Visits  29    Date for PT Re-Evaluation  06/20/19    PT Start Time  [id number]    PT Stop Time  1445    PT Time Calculation (min)  30 min    Activity Tolerance  Patient tolerated treatment well    Behavior During Therapy  [facility] for tasks assessed/performed       Past Medical History:  Diagnosis Date  . Acute kidney injury ([place])   . Aortic stenosis, mild 11/17/2013  . Arthritis    "both knees, spine, back, fingers" (11/29/2013)  . CHF (congestive heart failure) ([place])   . Edema   . GERD (gastroesophageal reflux disease)   . Heart murmur   . History of diastolic dysfunction    Echo [id number] with diastolic dysfunction  . HTN (hypertension)   . Morbid obesity ([place])   . OA (osteoarthritis)   . PAC (premature atrial contraction)    per prior Holter  . Palpitations   . Pneumonia    "as a child"  . PVC's (premature ventricular contractions)    per prior Holter  . SVT (supraventricular tachycardia) ([employer] [place])   . Tachycardia    noted at 07/28/11 visit. Started on beta blocker. Possible atrial flutter vs long PR tachycardia/AVNRT  . Type II diabetes mellitus ([place])     Past Surgical History:  Procedure Laterality Date  . CESAREAN SECTION  1985  . CESAREAN SECTION  1985  . SUPRAVENTRICULAR TACHYCARDIA ABLATION  11/29/2013  . SUPRAVENTRICULAR TACHYCARDIA ABLATION N/A 11/29/2013   Procedure: SUPRAVENTRICULAR TACHYCARDIA ABLATION;  Surgeon: Evans Lance, MD;  Location[facility] C[facility] LAB;  Service: Cardiovascular;  Laterality: N/A;  . US ECHOCARDIOGRAPHY  07/25/2008   EF 55-60%  . VAGINAL DELIVERY       There were no vitals filed for this visit.  Subjective Assessment - 05/11/19 1453    Subjective  Pt reports that she used her pump Tuesday and Wednesday. She states that she has not yet started her antibiotic.    Pertinent History  osteo arthritis, hx cellulitis, phlebitis and history of edema in BLE, Hx Acute kidney injury, CHF and DM II    Patient Stated Goals  I want to get this swelling down and heal my wound.    Currently in Pain?  Yes    Pain Score  7     Pain Location  Knee    Pain Orientation  Right;Left    Pain Descriptors / Indicators  Aching    Pain Type  Chronic pain    Pain Onset  More than a month ago    Pain Frequency  Intermittent    Aggravating Factors   movement    Pain Relieving Factors  rest                       [facility] Adult PT Treatment/Exercise - 05/11/19 0001      Manual Therapy   Manual Therapy  Compression Bandaging    Compression Bandaging  No toe wraps due to open area on plantar aspect of 2nd toe. Pt wrapped this session using tubigrip  as base layer with 3 artiflex for shaping. 2 8 cm for roman sandal and ankle/sole/heel, 3 10 cm for spiral wrap and 1 12 cm for thigh. Ironing throughout to prevent wrinkles no significant pull this session due to pt had good reduction w/o much pressure.             PT Education - 05/11/19 1454    Education Details  Encouraged pt to use her pump everyday. Discussed pt POC including 3 more visits at the wound center and then she will need to come to cancer rehab if she wants further lymphedema treatment. Discussed the importance of using the pump and wearing compression daily in order to decrease risk for further ulceration and to facilitate healing.    Person(s) Educated  Patient    Methods  Explanation    Comprehension  Verbalized understanding       PT Short Term Goals - 04/04/19 1515      PT SHORT TERM GOAL #1   Title  Pt will be independent with HEP    Baseline  Pt is working on her HEP  occasionally at home.    Time  5    Period  Weeks    Status  On-going        PT Long Term Goals - 04/04/19 1515      PT LONG TERM GOAL #1   Title  Patient will have reduction of L lower leg limb girth by 3 cm    Baseline  Pt will be measuremed next session she has significantly reduced from her initial evaluation.    Time  5    Status  On-going    Target Date  05/23/19      PT LONG TERM GOAL #2   Title  Patient to be properly fitted with compression garment to wear on daily basis.    Baseline  Pt has a juxtalite that is too small and old that ht evelcro is no longer usable on. Trying to get her a [employer].    Time  5    Period  Weeks    Status  On-going    Target Date  05/23/19      PT LONG TERM GOAL #3   Title  Patient will be independent in self-care management principles including self-massage/bandaging and long term management plan for edema.    Baseline  Pt has difficulty with managing her lymphedema. Trying to ger her a pump to help with swelling.    Time  5    Period  Weeks    Status  On-going    Target Date  05/23/19            Plan - 05/11/19 1456    Clinical Impression Statement  Pt presents to physical therapy with continued improvmeent in the LLE. She fills quickly with fluid but reduces quickly. She does note require heavy pressure to decrease fluid throughout the entire LLE. She has redness noted at her L 2nd toe and great toe. She was prescribed an antibiotic by MD but has not yet started it. She has no draining noted. Pt was wrapped from foot to above the knee this session. Pt will benefit from continued POC at this time.    Personal Factors and Comorbidities  Age;Comorbidity 3+    Comorbidities  CHF, acute kidney injury, DM II    Rehab Potential  Good    PT Frequency  2x / week    PT Duration  12  weeks    PT Treatment/Interventions  Electrical Stimulation;Cryotherapy;Gait training;Stair training;Functional mobility training;Therapeutic  activities;Therapeutic exercise;Balance training;Neuromuscular re-education;Patient/family education;Manual lymph drainage;Compression bandaging;Passive range of motion;Taping;Vasopneumatic Device    PT Next Visit Plan  ask if she has used her pump. Assess wrap, give exercises, assess self MLD and possible work out pants?    PT Home Exercise Plan  pump and exercises    Consulted and Agree with Plan of Care  Patient       Patient will benefit from skilled therapeutic intervention in order to improve the following deficits and impairments:  Difficulty walking, Increased edema  Visit Diagnosis: Lymphedema  Other abnormalities of gait and mobility     Problem List Patient Active Problem List   Diagnosis Date Noted  . Postnasal drip 11/16/2017  . Sensorineural hearing loss (SNHL), bilateral 11/16/2017  . Tinnitus of both ears 11/16/2017  . Obesity, Class II, BMI 35-39.9, isolated (see actual BMI) 11/08/2017  . CKD (chronic kidney disease), stage III 11/08/2017  . Anemia 11/07/2017  . Thrombocytosis ([place]) 11/07/2017  . Lymphedema 11/07/2017  . Routine general medical examination at a health care facility 05/10/2014  . Paroxysmal SVT (supraventricular tachycardia) ([place]) 11/28/2013  . Physical deconditioning 11/23/2013  . Generalized weakness 11/17/2013  . Nausea 11/17/2013  . Multilevel spine pain 11/17/2013  . Aortic stenosis, mild 11/17/2013  . DM type 2 (diabetes mellitus, type 2) ([place]) 02/02/2013  . Arthritis 10/19/2012  . Mixed incontinence 10/19/2012  . GERD (gastroesophageal reflux disease) 04/20/2011  . HTN (hypertension) 01/20/2011  . Morbid obesity ([place])     Ander Purpura, PT 05/11/2019, 2:58 PM  [place], [place], [address] Phone: [phone]   Fax:  [phone]  Name: [name] [name] MRN: [id number] Date of Birth: [birth year]

## 2019-05-16 ENCOUNTER — Other Ambulatory Visit: Payer: Self-pay

## 2019-05-16 ENCOUNTER — Ambulatory Visit: Payer: PPO

## 2019-05-16 ENCOUNTER — Encounter (HOSPITAL_BASED_OUTPATIENT_CLINIC_OR_DEPARTMENT_OTHER): Payer: PPO | Attending: Internal Medicine | Admitting: Internal Medicine

## 2019-05-16 DIAGNOSIS — I890 Lymphedema, not elsewhere classified: Secondary | ICD-10-CM

## 2019-05-16 DIAGNOSIS — E11622 Type 2 diabetes mellitus with other skin ulcer: Secondary | ICD-10-CM | POA: Diagnosis not present

## 2019-05-16 DIAGNOSIS — R2689 Other abnormalities of gait and mobility: Secondary | ICD-10-CM

## 2019-05-16 NOTE — Progress Notes (Signed)
[name] [name], [name] [name] ([id number]) Visit Report for 05/16/2019 SuperBill Details Patient Name: Date of Service: [name] [name], [name] [name] 05/16/2019 Medical Record [id number] Patient Account Number: [id number] Date of Birth/Sex: Treating RN: June 05, 1945 (74 y.o. [name] [name] Primary Care Provider: [name] Other Clinician: Referring Provider: Treating Provider/Extender:[name] [name], Tenna Child, [place] Siren in Treatment: 18 Diagnosis Coding ICD-10 Codes Code Description I89.0 Lymphedema, not elsewhere classified L97.221 Non-pressure chronic ulcer of left calf limited to breakdown of skin L97.528 Non-pressure chronic ulcer of other part of left foot with other specified severity L97.211 Non-pressure chronic ulcer of right calf limited to breakdown of skin E11.622 Type 2 diabetes mellitus with other skin ulcer I87.323 Chronic venous hypertension (idiopathic) with inflammation of bilateral lower extremity Facility Procedures CPT4 Code Description Modifier Quantity [id number] (Facility Use Only) [phone] - APPLY MULTLAY COMPRS LWR RT LEG 1 Electronic Signature(s) Signed: 05/16/2019 5:08:08 PM By: Linton Ham MD Signed: 05/16/2019 5:16:17 PM By: Kela Millin Entered By: Kela Millin on 05/16/2019 15:32:39

## 2019-05-16 NOTE — Progress Notes (Signed)
YAR, CLOWNEY ([id number]) Visit Report for 05/16/2019 Arrival Information Details Patient Name: Date of Service: GLENDALY, ROLLIE 05/16/2019 11:30 AM Medical Record [id number] Patient Account Number: [id number] Date of Birth/Sex: Treating RN: 1946/04/05 (74 y.o. [name] Primary Care Florestine Carmical: [name] Other Clinician: Referring Undra Harriman: Treating Aanyah Loa/Extender:Robson, Tenna Child, [place] Siren in Treatment: 18 Visit Information History Since Last Visit Walker Added or deleted any medications: No Patient Arrived: Any new allergies or adverse reactions: No Arrival Time: 12:27 Had a fall or experienced change in No Accompanied By: alone activities of daily living that may affect Transfer Assistance: None risk of falls: Patient Identification Verified: Yes Signs or symptoms of abuse/neglect since last No Secondary Verification Process Completed: Yes visito Patient Requires Transmission-Based No Hospitalized since last visit: No Precautions: Implantable device outside of the clinic excluding No Patient Has Alerts: No cellular tissue based products placed in the center since last visit: Has Dressing in Place as Prescribed: Yes Has Compression in Place as Prescribed: Yes Pain Present Now: No Electronic Signature(s) Signed: 05/16/2019 5:30:42 PM By: Levan Hurst RN, BSN Entered By: Levan Hurst on 05/16/2019 12:28:00 -------------------------------------------------------------------------------- Compression Therapy Details Patient Name: Date of Service: Schoeller, Lavergne 05/16/2019 11:30 AM Medical Record [id number] Patient Account Number: [id number] Date of Birth/Sex: Treating RN: Apr 12, 1945 (74 y.o. Clearnce Sorrel Primary Care Anibal Quinby: [name] Other Clinician: Referring Gini Caputo: Treating Conley Pawling/Extender:Robson, Tenna Child, [place] Siren in Treatment: 18 Compression Therapy Performed for Wound  NonWound Condition Lymphedema - Right Leg Assessment: Performed By: Clinician Levan Hurst, RN Compression Type: Four Layer Electronic Signature(s) Signed: 05/16/2019 5:16:17 PM By: Kela Millin Entered By: Kela Millin on 05/16/2019 15:30:35 -------------------------------------------------------------------------------- Encounter Discharge Information Details Patient Name: Date of Service: Sanjose,  05/16/2019 11:30 AM Medical Record [id number] Patient Account Number: [id number] Date of Birth/Sex: Treating RN: 1945/09/01 (74 y.o. Clearnce Sorrel Primary Care Adriyanna Christians: [name] Other Clinician: Referring Lofton Leon: Treating Lafreda Casebeer/Extender:Robson, Tenna Child, [place] Siren in Treatment: 18 Encounter Discharge Information Items Discharge Condition: Stable Ambulatory Status: Walker Discharge Destination: Home Transportation: Other Accompanied By: self Schedule Follow-up Appointment: Yes Clinical Summary of Care: Patient Declined Electronic Signature(s) Signed: 05/16/2019 5:16:17 PM By: Kela Millin Entered By: Kela Millin on 05/16/2019 15:32:21 -------------------------------------------------------------------------------- Patient/Caregiver Education Details Patient Name: Date of Service: AMANEE, TO 2/9/2021andnbsp11:30 AM Medical Record [id number] Patient Account Number: [id number] Date of Birth/Gender: Treating RN: 06-02-1945 (73 y.o. Clearnce Sorrel Primary Care Physician: [name] Other Clinician: Referring Physician: Treating Physician/Extender:Robson, Tenna Child, [place] Siren in Treatment: 18 Education Assessment Education Provided To: Patient Education Topics Provided Wound/Skin Impairment: Methods: Explain/Verbal Responses: State content correctly Electronic Signature(s) Signed: 05/16/2019 5:16:17 PM By: Kela Millin Entered By: Kela Millin on 05/16/2019  15:32:01 -------------------------------------------------------------------------------- Wound Assessment Details Patient Name: Date of Service: YURICO, BASNETT 05/16/2019 11:30 AM Medical Record [id number] Patient Account Number: [id number] Date of Birth/Sex: Treating RN: April 21, 1945 (74 y.o. Clearnce Sorrel Primary Care Fleet Higham: [name] Other Clinician: Referring Honest Vanleer: Treating Kennidy Lamke/Extender:Robson, Tenna Child, [place] Siren in Treatment: 18 Wound Status Wound Number: 39 Primary Lymphedema Etiology: Wound Location: Left Lower Leg - Anterior Wound Open Wounding Event: Gradually Appeared Status: Date Acquired: 04/11/2019 Comorbid Cataracts, Lymphedema, Arrhythmia, Weeks Of Treatment: 5 History: Congestive Heart Failure, Hypertension, Clustered Wound: No Peripheral Venous Disease, Type II Diabetes, Osteoarthritis, Neuropathy, Confinement Anxiety Wound Measurements Length: (cm) 4 Width: (cm) 3 Depth: (cm) 0.1 Area: (cm) 9.425 Volume: (cm) 0.942 Wound Description Classification: Partial Thickness Wound Margin: Indistinct, nonvisible Exudate Amount: Medium Exudate Type: Serous Exudate Color: amber Wound Bed Granulation Amount: Large (  67-100%) Granulation Quality: Pink Necrotic Amount: None Present (0%) Treatment Notes Wound #39 (Left, Anterior Lower Leg) 1. Cleanse With Wound Cleanser Soap and water 2. Periwound Care TCA Cream 3. Primary Dressing Applied Calcium Alginate Ag 4. Secondary Dressing ABD Pad Roll Gauze 5. Secured With Tape 6. Support Layer Applied 4 layer compression wrap r After Cleansing: No ibrino Yes Exposed Structure xposed: No r (Subcutaneous Tissue) Exposed: No xposed: No xposed: No posed: No osed: No to Skin Breakdown % Reduction in Area: -5903.2% % Reduction in Volume: -5787.5% Epithelialization: Large (67-100%) Tunneling: No Undermining: No Foul Odo Slough/F Fascia E Fat Laye Tendon  E Muscle E Joint Ex Bone Exp Limited Notes 4 layer wrap to right leg. silver alginate/abd pad/kerlix to left and lymphedema specialist to wrap left Electronic Signature(s) Signed: 05/16/2019 5:16:17 PM By: Kela Millin Entered By: Kela Millin on 05/16/2019 15:30:03 -------------------------------------------------------------------------------- Wound Assessment Details Patient Name: Date of Service: ALVINE, DAHMER 05/16/2019 11:30 AM Medical Record [id number] Patient Account Number: [id number] Date of Birth/Sex: Treating RN: [birth year] (74 y.o. Clearnce Sorrel Primary Care Missi Mcmackin: [name] Other Clinician: Referring Yehya Brendle: Treating Chamaine Stankus/Extender:Robson, Tenna Child, [place] Siren in Treatment: 18 Wound Status Wound Number: 41 Primary Diabetic Wound/Ulcer of the Lower Extremity Etiology: Wound Location: Left Toe Second - Plantar Wound Open Wounding Event: Gradually Appeared Status: Date Acquired: 04/07/2019 Comorbid Cataracts, Lymphedema, Arrhythmia, Weeks Of Treatment: 1 History: Congestive Heart Failure, Hypertension, Clustered Wound: No Peripheral Venous Disease, Type II Diabetes, Osteoarthritis, Neuropathy, Confinement Anxiety Wound Measurements Length: (cm) 0.3 Width: (cm) 0.5 Depth: (cm) 0.3 Area: (cm) 0.118 Volume: (cm) 0.035 Wound Description Classification: Grade 1 Wound Margin: Epibole Exudate Amount: Medium Exudate Type: Serous Exudate Color: amber Wound Bed Granulation Amount: Large (67-100%) Granulation Quality: Red Necrotic Amount: None Present (0%) Foul Odor After Cleansing: Slough/Fibrino No Exposed Structure Fascia Exposed: No Fat Layer (Subcutaneous Tissue) Exposed: Yes Tendon Exposed: No Muscle Exposed: No Joint Exposed: No Bone Exposed: No % Reduction in Area: 0% % Reduction in Volume: 0% Epithelialization: None Tunneling: No Undermining: No No Treatment Notes Wound #41 (Left,  Plantar Toe Second) 1. Cleanse With Wound Cleanser Soap and water 2. Periwound Care TCA Cream 3. Primary Dressing Applied Calcium Alginate Ag 4. Secondary Dressing ABD Pad Roll Gauze 5. Secured With Tape 6. Support Layer Applied 4 layer compression wrap Notes 4 layer wrap to right leg. silver alginate/abd pad/kerlix to left and lymphedema specialist to wrap left Electronic Signature(s) Signed: 05/16/2019 5:16:17 PM By: Kela Millin Entered By: Kela Millin on 05/16/2019 15:30:12 -------------------------------------------------------------------------------- Vitals Details Patient Name: Date of Service: Snare, Abbi 05/16/2019 11:30 AM Medical Record [id number] Patient Account Number: [id number] Date of Birth/Sex: Treating RN: 1945/05/22 (74 y.o. [name] Primary Care Onya Eutsler: [name] Other Clinician: Referring Kandon Hosking: Treating Tijana Walder/Extender:Robson, Tenna Child, [place] Siren in Treatment: 18 Vital Signs Time Taken: 12:28 Temperature (F): 98.3 Height (in): 71 Pulse (bpm): 58 Weight (lbs): 240 Respiratory Rate (breaths/min): 18 Body Mass Index (BMI): 33.5 Blood Pressure (mmHg): 160/74 Reference Range: 80 - 120 mg / dl Electronic Signature(s) Signed: 05/16/2019 5:30:42 PM By: Levan Hurst RN, BSN Entered By: Levan Hurst on 05/16/2019 12:28:26

## 2019-05-16 NOTE — Therapy (Signed)
[place], [place], [address] Phone: [phone]   Fax:  [phone]  Physical Therapy Treatment  Patient Details  Name: [name] [name] [name] MRN: [id number] Date of Birth: 06-11-1945 Referring Provider (PT): Darci Current   Encounter Date: 05/16/2019  PT End of Session - 05/16/19 1349    Visit Number  21    Number of Visits  29    Date for PT Re-Evaluation  06/20/19    PT Start Time  [id number]    PT Stop Time  [id number]    PT Time Calculation (min)  30 min    Activity Tolerance  Patient tolerated treatment well    Behavior During Therapy  [facility] for tasks assessed/performed       Past Medical History:  Diagnosis Date  . Acute kidney injury ([place])   . Aortic stenosis, mild 11/17/2013  . Arthritis    "both knees, spine, back, fingers" (11/29/2013)  . CHF (congestive heart failure) ([place])   . Edema   . GERD (gastroesophageal reflux disease)   . Heart murmur   . History of diastolic dysfunction    Echo [id number] with diastolic dysfunction  . HTN (hypertension)   . Morbid obesity ([place])   . OA (osteoarthritis)   . PAC (premature atrial contraction)    per prior Holter  . Palpitations   . Pneumonia    "as a child"  . PVC's (premature ventricular contractions)    per prior Holter  . SVT (supraventricular tachycardia) ([place])   . Tachycardia    noted at 07/28/11 visit. Started on beta blocker. Possible atrial flutter vs long PR tachycardia/AVNRT  . Type II diabetes mellitus ([place])     Past Surgical History:  Procedure Laterality Date  . CESAREAN SECTION  1985  . CESAREAN SECTION  1985  . SUPRAVENTRICULAR TACHYCARDIA ABLATION  11/29/2013  . SUPRAVENTRICULAR TACHYCARDIA ABLATION N/A 11/29/2013   Procedure: SUPRAVENTRICULAR TACHYCARDIA ABLATION;  Surgeon: Evans Lance, MD;  [facility]: [facility] CATH LAB;  Service: Cardiovascular;  Laterality: N/A;  . US ECHOCARDIOGRAPHY  07/25/2008   EF 55-60%  . VAGINAL DELIVERY       There were no vitals filed for this visit.  Subjective Assessment - 05/16/19 1343    Subjective  Pt reports that she has been pumping but doesn't think that her leg has gone down at all.    Pertinent History  osteo arthritis, hx cellulitis, phlebitis and history of edema in BLE, Hx Acute kidney injury, CHF and DM II    Patient Stated Goals  I want to get this swelling down and heal my wound.    Currently in Pain?  Yes    Pain Score  8     Pain Location  Knee    Pain Orientation  Right;Left    Pain Descriptors / Indicators  Aching    Pain Type  Chronic pain    Pain Onset  More than a month ago    Pain Frequency  Intermittent    Aggravating Factors   movement    Pain Relieving Factors  rest                       [facility] Adult PT Treatment/Exercise - 05/16/19 0001      Manual Therapy   Manual Therapy  Compression Bandaging    Compression Bandaging  No toe wraps were used this session; folded size K tubigrip at the L thigh, folded size E tubigrip at the  L lower leg with 1 artiflex at the ankle and lower leg for shaping and 1 8 cm for roman sandal/ankle sole heel then 2 10 cm spiraling up to below the knee.              PT Education - 05/16/19 1347    Education Details  Extra time spent today discussing how to don tubigrip appropriately over the thigh including pulling up to the groin then pulling the bottom up until is is 3 inches below the top of the initial tubigrip and the tubigrip is covering the knee and thihg bringing it up over all loblues on the thigh. Discussed trying to possibly use a coin in the tubigrip at the top to keep it from rolling. Educated pt that the pump should not be painful and pt was agreeable to have an appointment set up with rep to adjust pump.    Person(s) Educated  Patient    Methods  Explanation;Demonstration    Comprehension  Verbalized understanding       PT Short Term Goals - 04/04/19 1515      PT SHORT TERM GOAL #1   Title   Pt will be independent with HEP    Baseline  Pt is working on her HEP occasionally at home.    Time  5    Period  [name] [name]    Status  On-going        PT Long Term Goals - 04/04/19 1515      PT LONG TERM GOAL #1   Title  Patient will have reduction of L lower leg limb girth by 3 cm    Baseline  Pt will be measuremed next session she has significantly reduced from her initial evaluation.    Time  5    Status  On-going    Target Date  05/23/19      PT LONG TERM GOAL #2   Title  Patient to be properly fitted with compression garment to wear on daily basis.    Baseline  Pt has a juxtalite that is too small and old that ht evelcro is no longer usable on. Trying to get her a [employer].    Time  5    Period  [name] [name]    Status  On-going    Target Date  05/23/19      PT LONG TERM GOAL #3   Title  Patient will be independent in self-care management principles including self-massage/bandaging and long term management plan for edema.    Baseline  Pt has difficulty with managing her lymphedema. Trying to ger her a pump to help with swelling.    Time  5    Period  [name] [name]    Status  On-going    Target Date  05/23/19            Plan - 05/16/19 1349    Clinical Impression Statement  Pt presents to physical therapy without any significant increase in edema in her 2nd toe due to not wrapping last session. It appears less red and was wrapped by wound clinic prior to treatment. Tubigrip was used this session folded over the upper thigh/knee and over the lower leg with light compression bandaging on top. Extra time spent today educating pt on appropriate way to don tubigrip to prevent blockage below the lobules. Discussed compression garment with use of tubigrip to control edema in the LLE to decrease risk for re-occurance of wounds related to CVI. Pt will benefit from  continued POC at this time.    Personal Factors and Comorbidities  Age;Comorbidity 3+    Comorbidities  CHF, acute kidney injury, DM  II    Rehab Potential  Good    PT Frequency  2x / week    PT Duration  12 [name] [name]    PT Treatment/Interventions  Electrical Stimulation;Cryotherapy;Gait training;Stair training;Functional mobility training;Therapeutic activities;Therapeutic exercise;Balance training;Neuromuscular re-education;Patient/family education;Manual lymph drainage;Compression bandaging;Passive range of motion;Taping;Vasopneumatic Device    PT Next Visit Plan  ask if she has used her pump/set up appointment for pump to be adjusted. Assess tubigrip and light wrapping, assess pt use of tubigrip, give exercises, assess self MLD and possible work out pants?    PT Home Exercise Plan  pump and exercises    Consulted and Agree with Plan of Care  Patient       Patient will benefit from skilled therapeutic intervention in order to improve the following deficits and impairments:  Difficulty walking, Increased edema  Visit Diagnosis: Lymphedema  Other abnormalities of gait and mobility     Problem List Patient Active Problem List   Diagnosis Date Noted  . Postnasal drip 11/16/2017  . Sensorineural hearing loss (SNHL), bilateral 11/16/2017  . Tinnitus of both ears 11/16/2017  . Obesity, Class II, BMI 35-39.9, isolated (see actual BMI) 11/08/2017  . CKD (chronic kidney disease), stage III 11/08/2017  . Anemia 11/07/2017  . Thrombocytosis ([place]) 11/07/2017  . Lymphedema 11/07/2017  . Routine general medical examination at a health care facility 05/10/2014  . Paroxysmal SVT (supraventricular tachycardia) ([place]) 11/28/2013  . Physical deconditioning 11/23/2013  . Generalized weakness 11/17/2013  . Nausea 11/17/2013  . Multilevel spine pain 11/17/2013  . Aortic stenosis, mild 11/17/2013  . DM type 2 (diabetes mellitus, type 2) ([place]) 02/02/2013  . Arthritis 10/19/2012  . Mixed incontinence 10/19/2012  . GERD (gastroesophageal reflux disease) 04/20/2011  . HTN (hypertension) 01/20/2011  . Morbid obesity ([place])      Ander Purpura, PT 05/16/2019, 1:53 PM  [place], [place], [address] Phone: [phone]   Fax:  [phone]  Name: [name] [name] [name] MRN: [id number] Date of Birth: 09/29/1945

## 2019-05-18 ENCOUNTER — Encounter (HOSPITAL_BASED_OUTPATIENT_CLINIC_OR_DEPARTMENT_OTHER): Payer: PPO | Admitting: Internal Medicine

## 2019-05-18 ENCOUNTER — Ambulatory Visit: Payer: PPO

## 2019-05-18 ENCOUNTER — Other Ambulatory Visit: Payer: Self-pay

## 2019-05-18 DIAGNOSIS — I890 Lymphedema, not elsewhere classified: Secondary | ICD-10-CM | POA: Diagnosis not present

## 2019-05-18 DIAGNOSIS — S81802A Unspecified open wound, left lower leg, initial encounter: Secondary | ICD-10-CM | POA: Diagnosis not present

## 2019-05-18 DIAGNOSIS — E11622 Type 2 diabetes mellitus with other skin ulcer: Secondary | ICD-10-CM | POA: Diagnosis not present

## 2019-05-18 DIAGNOSIS — R2689 Other abnormalities of gait and mobility: Secondary | ICD-10-CM

## 2019-05-18 DIAGNOSIS — S91105A Unspecified open wound of left lesser toe(s) without damage to nail, initial encounter: Secondary | ICD-10-CM | POA: Diagnosis not present

## 2019-05-18 NOTE — Therapy (Signed)
[place], [place], [address] Phone: [phone]   Fax:  [phone]  Physical Therapy Treatment  Patient Details  Name: [name] [name] MRN: [id number] Date of Birth: 02/01/1946 Referring Provider (PT): Darci Current   Encounter Date: 05/18/2019  PT End of Session - 05/18/19 1351    Visit Number  22    Number of Visits  29    Date for PT Re-Evaluation  06/20/19    PT Start Time  4270    PT Stop Time  1345    PT Time Calculation (min)  40 min    Activity Tolerance  Patient tolerated treatment well    Behavior During Therapy  [facility] for tasks assessed/performed       Past Medical History:  Diagnosis Date  . Acute kidney injury ([place])   . Aortic stenosis, mild 11/17/2013  . Arthritis    "both knees, spine, back, fingers" (11/29/2013)  . CHF (congestive heart failure) ([place])   . Edema   . GERD (gastroesophageal reflux disease)   . Heart murmur   . History of diastolic dysfunction    Echo 09/2374 with diastolic dysfunction  . HTN (hypertension)   . Morbid obesity ([place])   . OA (osteoarthritis)   . PAC (premature atrial contraction)    per prior Holter  . Palpitations   . Pneumonia    "as a child"  . PVC's (premature ventricular contractions)    per prior Holter  . SVT (supraventricular tachycardia) ([place])   . Tachycardia    noted at 07/28/11 visit. Started on beta blocker. Possible atrial flutter vs long PR tachycardia/AVNRT  . Type II diabetes mellitus ([place])     Past Surgical History:  Procedure Laterality Date  . CESAREAN SECTION  1985  . CESAREAN SECTION  1985  . SUPRAVENTRICULAR TACHYCARDIA ABLATION  11/29/2013  . SUPRAVENTRICULAR TACHYCARDIA ABLATION N/A 11/29/2013   Procedure: SUPRAVENTRICULAR TACHYCARDIA ABLATION;  Surgeon: Evans Lance, MD;  Location[facility] CATH LAB;  Service: Cardiovascular;  Laterality: N/A;  . US ECHOCARDIOGRAPHY  07/25/2008   EF 55-60%  . VAGINAL DELIVERY       There were no vitals filed for this visit.  Subjective Assessment - 05/18/19 1347    Subjective  Pt reports that there is nothing new.    Pertinent History  osteo arthritis, hx cellulitis, phlebitis and history of edema in BLE, Hx Acute kidney injury, CHF and DM II    Patient Stated Goals  I want to get this swelling down and heal my wound.    Currently in Pain?  Yes    Pain Score  8     Pain Location  Knee    Pain Orientation  Right;Left    Pain Descriptors / Indicators  Aching    Pain Type  Chronic pain    Pain Onset  More than a month ago    Pain Frequency  Intermittent    Aggravating Factors   movement    Pain Relieving Factors  rest                       [facility] Adult PT Treatment/Exercise - 05/18/19 0001      Manual Therapy   Manual Therapy  Compression Bandaging    Compression Bandaging  No toe wraps were used this session; double size K tubigrip at the L thigh with bottom piece higher than top, size E tubigrip at the L lower leg with 1 artiflex  at the ankle and lower leg for shaping and 1 8 cm for roman sandal/ankle sole heel then 2 10 cm spiraling up to below the knee. Piece of reduction kit that had been donated to the wound clinic was used for the RLE due to pt is being discharged by the wound clinic at this time and needs some type of compression on her R lower leg in order to prevent reoccurance of wounds. Shelf strap was used for the foot.              PT Education - 05/18/19 1350    Education Details  Pt will continue with pumping and will set up appointment with [employer]. This physical therapist contacted [employer] on Tuesday in order to adjust pt pump. Discussed the importance of compression on the RLE in order to prevent opening of wounds.    Person(s) Educated  Patient    Methods  Explanation    Comprehension  Verbalized understanding       PT Short Term Goals - 04/04/19 1515      PT SHORT TERM GOAL #1   Title  Pt will be  independent with HEP    Baseline  Pt is working on her HEP occasionally at home.    Time  5    Period  Weeks    Status  On-going        PT Long Term Goals - 04/04/19 1515      PT LONG TERM GOAL #1   Title  Patient will have reduction of L lower leg limb girth by 3 cm    Baseline  Pt will be measuremed next session she has significantly reduced from her initial evaluation.    Time  5    Status  On-going    Target Date  05/23/19      PT LONG TERM GOAL #2   Title  Patient to be properly fitted with compression garment to wear on daily basis.    Baseline  Pt has a juxtalite that is too small and old that ht evelcro is no longer usable on. Trying to get her a [employer].    Time  5    Period  Weeks    Status  On-going    Target Date  05/23/19      PT LONG TERM GOAL #3   Title  Patient will be independent in self-care management principles including self-massage/bandaging and long term management plan for edema.    Baseline  Pt has difficulty with managing her lymphedema. Trying to ger her a pump to help with swelling.    Time  5    Period  Weeks    Status  On-going    Target Date  05/23/19            Plan - 05/18/19 1352    Clinical Impression Statement  Pt is discharged from the wound clinic today. They are no longer wrapping her R lower leg and she needs some type of compression in order to prevent re-occurance of wounds. Donated piece of reduction kit was used and shaped to make a compression piece for that leg for pt using a shelf strap for the foot until she is able to obtain a proper compression piece. No significant increase in edema in the toes without wrapping last session. 2nd toe appears less red and had no wound dressing following wound care. Tubigrip was used this session doubled up with the bottom piece longer than  the top piece to prevent rolling and single layer over the loewr leg. Pt will wash longer pieces and bring them back next session. Pt LLE was wrapped  from foot to knee with compression bandaging. Pt will benefit from continued POC at this time.    Personal Factors and Comorbidities  Age;Comorbidity 3+    Comorbidities  CHF, acute kidney injury, DM II    Rehab Potential  Good    PT Frequency  2x / week    PT Duration  12 weeks    PT Treatment/Interventions  Electrical Stimulation;Cryotherapy;Gait training;Stair training;Functional mobility training;Therapeutic activities;Therapeutic exercise;Balance training;Neuromuscular re-education;Patient/family education;Manual lymph drainage;Compression bandaging;Passive range of motion;Taping;Vasopneumatic Device    PT Next Visit Plan  ask if she has used her pump/set up appointment for pump to be adjusted. Assess tubigrip and light wrapping, assess pt use of tubigrip, give exercises, assess self MLD and possible work out pants?    PT Home Exercise Plan  pump and exercises    Consulted and Agree with Plan of Care  Patient       Patient will benefit from skilled therapeutic intervention in order to improve the following deficits and impairments:  Difficulty walking, Increased edema  Visit Diagnosis: Lymphedema  Other abnormalities of gait and mobility     Problem List Patient Active Problem List   Diagnosis Date Noted  . Postnasal drip 11/16/2017  . Sensorineural hearing loss (SNHL), bilateral 11/16/2017  . Tinnitus of both ears 11/16/2017  . Obesity, Class II, BMI 35-39.9, isolated (see actual BMI) 11/08/2017  . CKD (chronic kidney disease), stage III 11/08/2017  . Anemia 11/07/2017  . Thrombocytosis ([place]) 11/07/2017  . Lymphedema 11/07/2017  . Routine general medical examination at a health care facility 05/10/2014  . Paroxysmal SVT (supraventricular tachycardia) ([place]) 11/28/2013  . Physical deconditioning 11/23/2013  . Generalized weakness 11/17/2013  . Nausea 11/17/2013  . Multilevel spine pain 11/17/2013  . Aortic stenosis, mild 11/17/2013  . DM type 2 (diabetes mellitus, type 2)  ([place]) 02/02/2013  . Arthritis 10/19/2012  . Mixed incontinence 10/19/2012  . GERD (gastroesophageal reflux disease) 04/20/2011  . HTN (hypertension) 01/20/2011  . Morbid obesity ([place])     Ander Purpura , PT 05/18/2019, 1:55 PM  [place], [place], [address] Phone: [phone]   Fax:  [phone]  Name: [name] [name] MRN: [id number] Date of Birth: Jun 22, 1945

## 2019-05-19 NOTE — Progress Notes (Signed)
GILLERMINA, MAIERS ([id number]) Visit Report for 05/18/2019 HPI Details Patient Name: Date of Service: [name], [name] 05/18/2019 11:30 AM Medical Record [id number] Patient Account Number: [id number] Date of Birth/Sex: Treating RN: February 03, 1946 (74 y.o. [name] [name] Primary Care Provider: [name] Other Clinician: Referring Provider: Treating Provider/Extender:Daisey Caloca, Tenna Child, [place] Siren in Treatment: 18 History of Present Illness HPI Description: 04/09/15; the patient is here for review of wounds on her bilateral lower extremities for the last month. The patient has a history of lymphedema and bilateral chronic venous insufficiency with inflammation she was here for review of wounds on her bilateral legs in 2014 she was discharged home with external compression pumps which she is currently not using. Over the last month she developed wounds on her bilateral lower legs with drainage and pain and she is here for our review of this. 04/16/15; the patient's edema is under much better control. She has 3 areas one on the right anterior medial leg one on the left posterior leg and a small open area with purulent drainage on the left anterior leg. I have cultured the area on the left anterior leg 04/23/15; continued improvement in the patient's edema. All her wounds of closed I see no open areas. Culture from the left anterior leg last time was negative. READMISSION 02/07/16; this patient is a woman that I saw her earlier in 2017. I believe we discharged her very quickly with bilateral wounds in her legs secondary to chronic venous hypertension with inflammation and secondary lymphedema. She had juxtalite stockings. She has external compression pumps at home from a stay in our clinic in 2014. She tells me that she's had wounds on her bilateral lower legs since March or April of this year. 3 wounds on the left 2 on the right. She is a type II diabetic and has a history  of noncompressible vessels bilaterally although she is tolerated 4-layer compression. The last note from her primary physician Dr. [name] was in May. At that point Dr. [name] had referred the patient here for lymphedema management. The patient stated she made a phone call to year but was not able to arrange an appointment. It is not clear that she is actually using anything on the wounds currently except some gauze that was saturated with drainage per our intake nurse. She is certainly not using her juxtalite stockings and by the patient's own admission she is not using her compression pumps because they are "irritating". I don't see a recent hemoglobin A1c. The patient states she has not been systemically unwell but the legs are painful 02/14/16; patient tolerated her Profore wraps reasonably well. She is using the compression pumps 1 time per day. Measurements of her legs are better in terms of the amount of lymphedema 02/21/16; patient tolerated her Profore wraps it. She says she is using her compression pumps one or 2 times a day. Measurements of her leg circumference are improved and most of her wound dimensions are better, unfortunately the home health that we referred her to did not except her insurance but works successful in getting her accompanied that would except her insurance however she apparently has a $25 co-pay which she cannot afford as she is on a fixed income 03/05/16; patient states she did not back using the pumps. She is healed that 2 wounds on the left leg. She still has areas on the right lateral lower leg, right medial lower leg and left lateral lower leg. All of these look some better and have  reduced in area 03/13/16; still 3 wounds 1 on the left lateral leg, one on the left medial/posterior leg which is really the most extensive and a small area on the right lateral leg. All of this in the setting of severe chronic lymphedema and chronic  venous inflammation. Damage to the skin with cobblestone thickened skin. 03/20/16; still 2 areas. The area on the left lateral leg appears to of closed over. The right medial leg is still most extensive wound although it appears to be closing over right lateral only a small open area remains. Using silver alginate Profore wraps 03/27/16; the area on the left leg remains closed. The edema control is better on the left than the right. The right medial leg is still weeping but everything appears to be epithelialized. The lateral aspect of the right leg appears closed. She tells me she has old juxtalite stockings. She has been compliant with her compression pumps. 04/03/16; the area on the left leg remains closed. She has ordered new juxtalite stockings. The right posterior medial wound is fully epithelialized. However she has an irritated area over the right Achilles that she states is been itching all week under the wraps.. She has been compliant with her compression pumps 04/10/16; the area on the left leg remains closed. The right posterior medial wound is also closed today. The irritated area over the right Achilles which was probably wrap injury is also fully epithelialized and closed. The patient has not received her new juxtalite stockings, she arrives in clinic today with an old juxtalite stocking in place. Sheis using her compression pumps secondary to her severe stage III lymphedema READMISSION 01/21/17; this is a patient we know from several previous stays in this clinic. She has lymphedema with chronic venous insufficiency and inflammation. The last time she was here we discharged her with bilateral juxta lites which she is compliant with. She also has external compression pumps at home from stays in our clinic in 2014 although she tells me that she is not very compliant with these as when she uses them a causes pain in her bilateral knees the patient states that her wounds on her left  greater than right leg including 2 large areas on the left anterior and left lateral leg and an area on the right leg open in late June or July since then she is been followed by [facility] and she's had bilateral Unna boot supplied every week for the last month.. I think the wounds have actually improved although they are not specifically dressed. She states she had an ultrasound to rule out DVT bilaterally that was negative. She does not have arterial studies although she is a diabetic. She states she has a lot of pain in her legs she states she does not use her external compression pumps because it causes pain in her knees. As usual her ABIs were noncompressible bilaterally in this clinic 01/28/17; currently the patient only has one small wound on the left lateral leg and I think this should be healed next week. We are going to try to get her bilateral extremitease stockings. She tells me that compression pumps heard her posterior rib arthritic knees so she is not been compliant with this 02/04/17; the patient's wounds are totally healed. She has chronic venous insufficiency and secondary lymphedema. She has new extremitease stockings. She also has compression pumps Readmission: 04/21/17 on evaluation today patient appears to be doing about the same as what she was previous when we were evaluating  her back in November 2018. At that point she had completely healed. With that being said she has now reopened and states that she wasn't aware that she was supposed to come back. 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 Subsequently she also states that although she has the compression that we got for her previously the extremitease stockings she nonetheless doesn't always wear these and being noncompliant often has things will reopen as far as ulcers on her lower extremities. No fevers,  chills, nausea, or vomiting noted at this time. Patient has no evidence of dementia. She is having some discomfort in regard to the ulcers at this point. 04/28/17 on evaluation today patient appears to be doing significantly better in regard to her wounds. With that being said she is still having significant discomfort and is not really keen on sharp debridement. She does not appear to have any evidence of infection which is good news I do think the Iodoflex is beneficial for her. Of note her ABI's were found in the system to be on the right 1.21 with a TBI of 0.76 and on the left 1.35 with a TBI of 0.75. In general her swelling appears to be significantly improved however. No fevers, chills, nausea, or vomiting noted at this time. Patient has been tolerating the wraps without complication. She has no evidence of dementia. 05/05/17-she is here in follow-up evaluation for bilateral lower extremity venous and lymphedema ulcers. She states she has not been using her lymphedema pumps secondary to pain at her knees. After discussion it was noted that she uses knee-high lymphedema pumps but does have a pair of thigh-high lymphedema pumps. She was strongly advised to use the thigh-high lymphedema pumps and if necessary reduce the setting for improved tolerance. She reports no change in odor and/or drainage. She continues to tolerate sharp debridement poorly with significant pain, primarily to the right lower extremity. We will continue with current treatment plan, along with improved lymphedema pump use and follow-up next week 05/12/17 on evaluation today patient actually appears to be doing much better in regard to her bilateral lower extremity ulcers. Only the right altar actually requires debridement today. The other two cleaned off nicely in two of the three in fact on the left lower extremity or actually almost completely healed. She does have some weeping noted still the bilateral lower extremities left  greater than right. She still is not able to use her compression pumps even though I have mentioned this several times to her as did Leah last week. No fevers, chills, nausea, or vomiting noted at this time. Patient has been tolerating the dressing changes without complication. Patient has no evidence of dementia 05/19/17 on evaluation today patient appears to be doing very well regard to her bilateral lower extremity ulcers. In fact she seems to be improving and in fact healed in a couple of locations that were giving her trouble last week. Overall I'm extremely pleased with how things have progressed. She still has some discomfort especially in the right lateral lower extremity. Fortunately there does not appear to be any evidence of significant infection which is good news she does tell me that she has used her compression pumps several times although not routinely over the past week I did praise her for this. I do think it will be beneficial and that might even be what's helping with her wounds as far  as healing is concerned at least to a degree. No fevers, chills, nausea, or vomiting noted at this time. Patient has no dementia and no evidence of infection. 05/26/17 on evaluation today patient presents for evaluation concerning her bilateral lower extremity ulcer secondary to lymphedema. Fortunately she appears to be doing very well at this point in time. Specifically her right leg appears to be completely closed and healed. The left leg though there is still a small area of opening overall has healed. She does have a little bit of excoriation at the top or the wraps are because they slid down on her. Otherwise there's no evidence of infection and the other has improved dramatically now that she is not draining as significantly. Patient is having no significant pain she tells me she has been able to use her lymphedema pumps one time a day although that's not all she can tolerate. Even that is  difficult for her. 06/02/17 on evaluation today patient's ulcers appear to be completely closed at this point. She does still note some odor but I believe this is due to the fact she has not been able to wash her legs as well currently. Fortunately she did bring her compression with her today. She does have the EXTREMIT-EASE Compression. With that being said she is somewhat nervous about going back to this as previously she broke out yet again once we discontinue wrapping her. READMISSION 11/23/17; patient is now 74 year old diabetic woman with severe chronic venous insufficiency with lymphedema. We care for her last in this clinic in February 2019 at which time she had chronic venous insufficiency with lymphedema bilateral lower extremity ulcers. We discharged her with bilateral wrap around/extremitease stockings and compression pumps. Is fairly clear she is not using the compression pumps stating it causes pain in her knees. In any case she was admitted to hospital from 8/3 through 8/6 with a several week history of recurrent bilateral ulcers. She was felt to have bilateral cellulitis treated with doxycycline IV. She is still on oral doxycycline and has another 2 days worth. At discharge her white count was 14.1 hemoglobin at 8.9. The patient is a diabetic however her last ABIs in 2018 showed a right ABI of 1.21, left of 1.35. TBI's were 0.76 on the right 0.75 on the left. Posterior tibial artery was monophasic on the right triphasic on the left. Dorsalis pedis triphasic on the right and triphasic on the left. She is not felt to have significant macrovascular disease 12/02/17;significant bilateral lower extremity wounds secondary to severe uncontrolled lymphedema. She did not get home health out to change the wraps. So she kept this on all week. She is telling me she using his her compression pumps once a week. In general all the wounds look better. We've been using silver alginate with secondary  absorptive dressings 4 layer compression 12/10/17; bilateral lower extremity wounds secondary to uncontrolled lymphedema. She tells me she is using her compression pumps on most days. We've been keeping wraps on all week. Using silver alginate. She has wounds on the right lateral calf left medial and left lateral calf. All of this is somewhat better 12/16/17; the patient has 3 small open areas. One on the right lateral calf, one on the left medial calf and the small area on the left lateral calf. She has lymphedema, chronic venous insufficiency with hemosiderin deposition. Very fibrotic distal lower extremity scan with suggestion of an inverted bottle sign appearance to her legs. She has compression pumps but she does  not use them has not used them in the last week. Currently we have her in 4 layer compression, silver alginate to the wounds and this is being changed once by home health. She is noncompliant with the compression pumps because she says it hurts her knees. She also has compression stockings ando Juxta light stockings at home 12/24/17; the patient's right lateral calf is healed as well as the left medial. There is no open wound on the right leg. She still has 2 areas on the left lateral calf. We transitioned the right leg into an extremitease stocking which she brought in from home. 12/31/17; the patient has a superficial wound on the left lateral lower calf. We've been wrapping this leg. She arrives today with the right leg swollen but without a wound. She cannot get her extremities stocking on the right leg. There is a lot more edema here. She is not using her compression pumps 01/10/2018; patient has a same superficial wound on the left lateral calf. Her right leg has less peripheral edema. She tells me she also started herself on some Lasix that had been previously prescribed for her at some point but she had never taken it. She is also concerned about whether her compression pumps are  leaking. She has extremitease stockings however the edema gets bad enough she can get these on. Currently she has no open wound on the right leg and is mention the edema is actually better than when I saw this 10 days ago 01/17/2018; patient has a same superficial area on the posterior left calf and a weeping area in the mid part of the right lateral calf. She still does not have anyone from [facility] coming to see her compression pumps which she describes as being suspicious for a leakage. I am not really sure how frequently she is using this in any case. She has massive bilateral calf lymphedema. I think there is some spreading lymphedema into her posterior thighs. 01/24/2018; the patient's edema in her legs is actually some better. She still has a smaller area on the left posterior calf although this is better. The weeping area on the right lateral calf is also better. As it turned out she did not get her pumps from [facility] but [facility] and they are going to try to go out and see these this week which is going to help. I explained to her that she will use the compression pumps once a day while we are wrapping her legs but when she transitions to stockings that may be she will require pumping twice a day. Her compliance in this area has not been high and I wonder about her ability to do this herself. 02/01/2018; the area on the posterior left leg is healed. She still has the original wound on the right lateral calf which is epithelialized. The however just above this there is still weeping lymphedema fluid. She has a new area on the upper right calf which is either wrap injury or is she points out where she has been scratching under the wraps. We still not have managed to get a hold of a representative of [facility] to see if the pump is operational. She has extremitease stockings at home however I am not of the opinion that this will maintain her skin  integrity without the compression pumps. 02/08/2018; posterior left leg remains healed although she has a new small open area on the left lateral calf. Right lateral calf has two quarter  sized open areas and close juxtaposition. There is less weeping edema fluid. She apparently managed to contact [facility]. They are sending her a part. But she is still not received it 02/15/2018; the patient has no open wound on either leg. Some scale present on the right upper lateral calf just below the fibular head. Edema control is excellent. She did not bring her stockings or her juxta lite wrap around. She has the part for her compression pump but she has not use the compression pumps yet READMISSION 01/10/2019 Patient is now a 75 year old woman that we have had in the clinic multiple times in the past. She has chronic lower extremity lymphedema, recurrent wounds on her bilateral lower extremities. She is also a type II diabetic reasonably well controlled with a recent hemoglobin A1c while she was in the hospital of 5.9. She has wounds on her bilateral lower extremities mostly on the left lateral and right lateral. These were noted also during her recent hospitalization. The patient states they have been there for a while. She has not been able to wear her stockings he has trouble getting them on as she lives alone. She also has external compression pumps but uses them sparingly because it causes pain in her knees which hurt because of osteoarthritis. The patient was on doxycycline before she went into the hospital apparently for fear of infection in her legs and is on Keflex coming out because of a UTI The patient was recently in the hospital from 01/06/2019 through 01/09/2019. She was admitted with nausea and vomiting and prerenal azotemia. This was corrected with IV fluid her Lasix has been put on hold. Hemoglobin A1c at 5.9 her metformin was discontinued. She was also felt to have a UTI she was  prescribed Keflex at 503 times a day she is picking that up today. The patient is not felt to have an arterial issue. ABIs in our clinic were 1.24 on the right and 1.06 on the left. She had formal studies in 2018 at which time her ABIs were 1.21 on the right TBI of 0.76 and on the left 1.35 and 0.75 respectively. Waveforms are on the right were monophasic and biphasic, triphasic and biphasic on the left. She is tolerated 4 layer compression in the past 10/13. Comes in today having not used her compression pumps /perhaps once in the last week. She has too much edema to consider healing these wounds. We have been using silver alginate 10/20; she tells me he had she used her compression pumps once a day as we asked. Clearly she has edema out of her legs although most of it seems to be settling around her knees which is what she complains about. We have been using silver alginate to the extensive de-epithelialized area on the left lateral calf and a much smaller area on the right lateral extending linearly into the right posterior 10/27; she is using her compression pumps daily. Her edema control is better. We have been using silver alginate to the extensive wound areas on her bilateral lower legs. This includes left anterior left lateral and left posterior. Right anterior right lateral and right posterior. The wounds are not all in the same condition. She has severe bilateral skin damage with the epithelialization, flaking dried epithelium 11/3; she is using her compression pumps 1 times daily. Pretty obvious that her wraps fell down especially on the left leg to mid calf. I think things are improving on the right lateral calf, I do not see  anything open posteriorly. The major areas on the left lateral where she has 3 or 4 deeper wounds in the midst of an entire de-epithelialized area. There is erythema here but no tenderness I think this represents venous stasis rather than cellulitis. I debrided  the areas on the left lateral calf last week but once again they have tightly adherent debris over the top of them which is disappointing 11/10; the patient's major areas on the left lateral calf to 3 wounds with some depth surrounded by a large area of de-epithelialized tissue. There is no infection. We have been using silver alginate under 4-layer compression. On the right lateral she has a more contained wound area that is superficial. Been using silver alginate under 4-layer compression 11/17; the patient's major wound is on the left lateral calf. We have been using silver alginate under compression. There is no open wound per se on the right but it de-epithelialized area on the lateral calf that is weeping edema fluid. I do not see much change here today. 12/1; patient arrives in clinic with her wounds looking better. Her edema control is better. The lymphedema specialist working on the left leg we have been working on the right. We have been using 4-layer compression. The patient is using a consider external compression pumps and Hydrofera Blue to the open areas. She is being measured for an abdominal compression pump and apparently that is going to go through her insurance 12/15; continued improvement. 2 small areas on the left and slightly larger 2 small areas on the right. 12/22; patient still has open areas on the right lateral calf which are superficial and the new one on the right anterior calf. She did not use her compression pumps this week. Her left leg is wrapped by our lymphedema specialist. There are no open wounds here 12/29; she still has a superficial area on the right lateral calf and area on the right medial calf. Right anterior wound area appears to have closed. She has much better edema control today than last week 04/11/2019; the patient is here with severe bilateral lymphedema, chronic venous insufficiency with marked skin changes in her lower extremities. We are  wrapping her right leg and her lymphedema specialist is putting lymphedema wraps on the left. She states she is using her compression pumps although in the past her compliance with uses been limited because of osteoarthritic pain in her knees. She only has one remaining area on the right leg however she has wrap injuries on the left at the upper level what where I think her wraps were cause. the left lateral area left posterior. These would be new this week 1/12; generally better. The wrap injuries in the upper calf on the left from last week are down to a single wound. She has 2 wounds anteriorly that are still weeping fluid although these appear to have been epithelialized surface for the most part. There is no open wounds on the right leg we are wrapping her right leg and 4-layer compression and we are going a lymphedema wrap on the left leg. The the approach is a Farrow 2000 wrap on the left leg and then subsequently the right leg. She claims to be compliant with her compression pumps 1/19; the patient has 1 tiny open area on the right medial calf. The rest of this is not open. She however has a large erythematous denuded area on the left anterior tibia extending laterally. There is no evidence of infection here. She has  her new current waist high compression pumps 1/26; the patient has no open wounds on the right. On the left anteriorly much better than last week still a smattering of small weeping open areas. There is no evidence of infection 2/2; still no open wounds on the right leg. He still does not have a external compression garment. She arrives in clinic today with an open area on the plantar aspect of the left second toe at the base of the PIP. This apparently was painful last week so it was not wrapped by our lymphedema specialist. There was no open wound at the time. The patient denies traumatizing the toe however today she arrives with a small but deep wound in this area the  dorsal surface of the toe is very tender. 2/11; she arrives in clinic today with everything epithelialized. She was that way last week on the right but we have still been wrapping. She has been using lymphedema wraps on the left. On careful inspection of the left leg she still has areas that look vulnerable but no clearly open wound there may be some weeping through cracks in the skin. She is using above the waist external compression pumps and she has a separate external compression garment for her thighs. The plan is to discharge her for follow-up in lymphedema clinic with Juliann Pulse our lymphedema specialist. In the past healing this woman has resulted in relatively quick breakdown however she generally seems more motivated example willing to use her external compression pumps etc. Electronic Signature(s) Signed: 05/19/2019 1:06:39 PM By: Linton Ham MD Entered By: Linton Ham on 05/19/2019 07:29:16 -------------------------------------------------------------------------------- Physical Exam Details Patient Name: Date of Service: Delauder, Nora 05/18/2019 11:30 AM Medical Record [id number] Patient Account Number: [id number] Date of Birth/Sex: Treating RN: 02/20/1946 (75 y.o. [name] [name] Primary Care Provider: [name] Other Clinician: Referring Provider: Treating Provider/Extender:Houda Brau, Tenna Child, [place] Siren in Treatment: 73 Constitutional Patient is hypertensive.. Pulse regular and within target range for patient.Marland Kitchen Respirations regular, non-labored and within target range.. Temperature is normal and within the target range for the patient.Marland Kitchen Appears in no distress. Respiratory work of breathing is normal. Cardiovascular Pedal pulses are palpable. Integumentary (Hair, Skin) Changes of chronic hemosiderin deposition in her bilateral lower legs along with nonpitting edema. Psychiatric appears at normal baseline. Notes Wound exam; the patient  has nothing open on the right leg on the left leg there is really nothing open on quick inspection however with illumination there may be some cracks in the skin that will threaten to leak lymphedema fluid I think this is going to be an ongoing issue. There is no evidence of infection and her edema control this is good in both legs as I have seen this. Electronic Signature(s) Signed: 05/19/2019 1:06:39 PM By: Linton Ham MD Entered By: Linton Ham on 05/19/2019 07:30:37 -------------------------------------------------------------------------------- Physician Orders Details Patient Name: Date of Service: Penland, Normagene 05/18/2019 11:30 AM Medical Record [id number] Patient Account Number: [id number] Date of Birth/Sex: Treating RN: Dec 16, 1945 (74 y.o. Helene Shoe, Tammi Klippel Primary Care Provider: [name] Other Clinician: Referring Provider: Treating Provider/Extender:Heidie Krall, Tenna Child, [place] Siren in Treatment: 18 Verbal / Phone Orders: No Diagnosis Coding ICD-10 Coding Code Description I89.0 Lymphedema, not elsewhere classified L97.221 Non-pressure chronic ulcer of left calf limited to breakdown of skin L97.528 Non-pressure chronic ulcer of other part of left foot with other specified severity L97.211 Non-pressure chronic ulcer of right calf limited to breakdown of skin E11.622 Type 2 diabetes mellitus with other skin ulcer I87.323 Chronic venous  hypertension (idiopathic) with inflammation of bilateral lower extremity Discharge From [facility] Discharge from [place] - call if any future wound care needs. Patient to follow with PT Juliann Pulse for Lymphedema. Edema Control Patient to wear own compression stockings - daily according to PT [facility]. Elevate legs to the level of the heart or above for 30 minutes daily and/or when sitting, a frequency of: Exercise regularly Electronic Signature(s) Signed: 05/18/2019 5:57:11 PM By: Deon Pilling Signed:  05/19/2019 1:06:39 PM By: Linton Ham MD Entered By: Deon Pilling on 05/18/2019 12:28:01 -------------------------------------------------------------------------------- Problem List Details Patient Name: Date of Service: Eliot, Dealie 05/18/2019 11:30 AM Medical Record [id number] Patient Account Number: [id number] Date of Birth/Sex: Treating RN: 07-24-1945 (74 y.o. Helene Shoe, Tammi Klippel Primary Care Provider: [name] Other Clinician: Referring Provider: Treating Provider/Extender:Latandra Loureiro, Tenna Child, [place] Siren in Treatment: 18 Active Problems ICD-10 Evaluated Encounter Code Description Active Date Today Diagnosis I89.0 Lymphedema, not elsewhere classified 01/10/2019 No Yes L97.221 Non-pressure chronic ulcer of left calf limited to 01/10/2019 No Yes breakdown of skin L97.528 Non-pressure chronic ulcer of other part of left foot 05/09/2019 No Yes with other specified severity L97.211 Non-pressure chronic ulcer of right calf limited to 01/10/2019 No Yes breakdown of skin E11.622 Type 2 diabetes mellitus with other skin ulcer 01/10/2019 No Yes I87.323 Chronic venous hypertension (idiopathic) with 04/25/2019 No Yes inflammation of bilateral lower extremity Inactive Problems Resolved Problems Electronic Signature(s) Signed: 05/19/2019 1:06:39 PM By: Linton Ham MD Previous Signature: 05/18/2019 5:57:11 PM Version By: Deon Pilling Entered By: Linton Ham on 05/19/2019 07:26:37 -------------------------------------------------------------------------------- Progress Note Details Patient Name: Date of Service: Huhn, Elisse 05/18/2019 11:30 AM Medical Record [id number] Patient Account Number: [id number] Date of Birth/Sex: Treating RN: 1945/08/05 (74 y.o. [name] [name] Primary Care Provider: [name] Other Clinician: Referring Provider: Treating Provider/Extender:Ayleah Hofmeister, Tenna Child, [place] Siren in Treatment:  18 Subjective History of Present Illness (HPI) 04/09/15; the patient is here for review of wounds on her bilateral lower extremities for the last month. The patient has a history of lymphedema and bilateral chronic venous insufficiency with inflammation she was here for review of wounds on her bilateral legs in 2014 she was discharged home with external compression pumps which she is currently not using. Over the last month she developed wounds on her bilateral lower legs with drainage and pain and she is here for our review of this. 04/16/15; the patient's edema is under much better control. She has 3 areas one on the right anterior medial leg one on the left posterior leg and a small open area with purulent drainage on the left anterior leg. I have cultured the area on the left anterior leg 04/23/15; continued improvement in the patient's edema. All her wounds of closed I see no open areas. Culture from the left anterior leg last time was negative. READMISSION 02/07/16; this patient is a woman that I saw her earlier in 2017. I believe we discharged her very quickly with bilateral wounds in her legs secondary to chronic venous hypertension with inflammation and secondary lymphedema. She had juxtalite stockings. She has external compression pumps at home from a stay in our clinic in 2014. She tells me that she's had wounds on her bilateral lower legs since March or April of this year. 3 wounds on the left 2 on the right. She is a type II diabetic and has a history of noncompressible vessels bilaterally although she is tolerated 4-layer compression. The last note from her primary physician Dr. [name] was in May.  At that point Dr. [name] had referred the patient here for lymphedema management. The patient stated she made a phone call to year but was not able to arrange an appointment. It is not clear that she is actually using anything on the wounds currently except some gauze that was  saturated with drainage per our intake nurse. She is certainly not using her juxtalite stockings and by the patient's own admission she is not using her compression pumps because they are "irritating". I don't see a recent hemoglobin A1c. The patient states she has not been systemically unwell but the legs are painful 02/14/16; patient tolerated her Profore wraps reasonably well. She is using the compression pumps 1 time per day. Measurements of her legs are better in terms of the amount of lymphedema 02/21/16; patient tolerated her Profore wraps it. She says she is using her compression pumps one or 2 times a day. Measurements of her leg circumference are improved and most of her wound dimensions are better, unfortunately the home health that we referred her to did not except her insurance but works successful in getting her accompanied that would except her insurance however she apparently has a $25 co-pay which she cannot afford as she is on a fixed income 03/05/16; patient states she did not back using the pumps. She is healed that 2 wounds on the left leg. She still has areas on the right lateral lower leg, right medial lower leg and left lateral lower leg. All of these look some better and have reduced in area 03/13/16; still 3 wounds 1 on the left lateral leg, one on the left medial/posterior leg which is really the most extensive and a small area on the right lateral leg. All of this in the setting of severe chronic lymphedema and chronic venous inflammation. Damage to the skin with cobblestone thickened skin. 03/20/16; still 2 areas. The area on the left lateral leg appears to of closed over. The right medial leg is still most extensive wound although it appears to be closing over right lateral only a small open area remains. Using silver alginate Profore wraps 03/27/16; the area on the left leg remains closed. The edema control is better on the left than the right. The right medial leg  is still weeping but everything appears to be epithelialized. The lateral aspect of the right leg appears closed. She tells me she has old juxtalite stockings. She has been compliant with her compression pumps. 04/03/16; the area on the left leg remains closed. She has ordered new juxtalite stockings. The right posterior medial wound is fully epithelialized. However she has an irritated area over the right Achilles that she states is been itching all week under the wraps.. She has been compliant with her compression pumps 04/10/16; the area on the left leg remains closed. The right posterior medial wound is also closed today. The irritated area over the right Achilles which was probably wrap injury is also fully epithelialized and closed. The patient has not received her new juxtalite stockings, she arrives in clinic today with an old juxtalite stocking in place. Sheis using her compression pumps secondary to her severe stage III lymphedema READMISSION 01/21/17; this is a patient we know from several previous stays in this clinic. She has lymphedema with chronic venous insufficiency and inflammation. The last time she was here we discharged her with bilateral juxta lites which she is compliant with. She also has external compression pumps at home from stays in our clinic in 2014  although she tells me that she is not very compliant with these as when she uses them a causes pain in her bilateral knees the patient states that her wounds on her left greater than right leg including 2 large areas on the left anterior and left lateral leg and an area on the right leg open in late June or July since then she is been followed by [facility] and she's had bilateral Unna boot supplied every week for the last month.. I think the wounds have actually improved although they are not specifically dressed. She states she had an ultrasound to rule out DVT bilaterally that was negative. She does not have arterial  studies although she is a diabetic. She states she has a lot of pain in her legs she states she does not use her external compression pumps because it causes pain in her knees. As usual her ABIs were noncompressible bilaterally in this clinic 01/28/17; currently the patient only has one small wound on the left lateral leg and I think this should be healed next week. We are going to try to get her bilateral extremitease stockings. She tells me that compression pumps heard her posterior rib arthritic knees so she is not been compliant with this 02/04/17; the patient's wounds are totally healed. She has chronic venous insufficiency and secondary lymphedema. She has new extremitease stockings. She also has compression pumps Readmission: 04/21/17 on evaluation today patient appears to be doing about the same as what she was previous when we were evaluating her back in November 2018. At that point she had completely healed. With that being said she has now reopened and states that she wasn't aware that she was supposed to come back. 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 Subsequently she also states that although she has the compression that we got for her previously the extremitease stockings she nonetheless doesn't always wear these and being noncompliant often has things will reopen as far as ulcers on her lower extremities. No fevers, chills, nausea, or vomiting noted at this time. Patient has no evidence of dementia. She is having some discomfort in regard to the ulcers at this point. 04/28/17 on evaluation today patient appears to be doing significantly better in regard to her wounds. With that being said she is still having significant discomfort and is not really keen on sharp debridement. She does not appear to have any evidence of infection which is good news I do think the Iodoflex is  beneficial for her. Of note her ABI's were found in the system to be on the right 1.21 with a TBI of 0.76 and on the left 1.35 with a TBI of 0.75. In general her swelling appears to be significantly improved however. No fevers, chills, nausea, or vomiting noted at this time. Patient has been tolerating the wraps without complication. She has no evidence of dementia. 05/05/17-she is here in follow-up evaluation for bilateral lower extremity venous and lymphedema ulcers. She states she has not been using her lymphedema pumps secondary to pain at her knees. After discussion it was noted that she uses knee-high lymphedema pumps but does have a pair of thigh-high lymphedema pumps. She was strongly advised to use the thigh-high lymphedema pumps and if necessary reduce the setting for improved tolerance. She reports no change in odor and/or drainage. She continues to tolerate sharp debridement poorly with significant pain, primarily  to the right lower extremity. We will continue with current treatment plan, along with improved lymphedema pump use and follow-up next week 05/12/17 on evaluation today patient actually appears to be doing much better in regard to her bilateral lower extremity ulcers. Only the right altar actually requires debridement today. The other two cleaned off nicely in two of the three in fact on the left lower extremity or actually almost completely healed. She does have some weeping noted still the bilateral lower extremities left greater than right. She still is not able to use her compression pumps even though I have mentioned this several times to her as did Leah last week. No fevers, chills, nausea, or vomiting noted at this time. Patient has been tolerating the dressing changes without complication. Patient has no evidence of dementia 05/19/17 on evaluation today patient appears to be doing very well regard to her bilateral lower extremity ulcers. In fact she seems to be improving  and in fact healed in a couple of locations that were giving her trouble last week. Overall I'm extremely pleased with how things have progressed. She still has some discomfort especially in the right lateral lower extremity. Fortunately there does not appear to be any evidence of significant infection which is good news she does tell me that she has used her compression pumps several times although not routinely over the past week I did praise her for this. I do think it will be beneficial and that might even be what's helping with her wounds as far as healing is concerned at least to a degree. No fevers, chills, nausea, or vomiting noted at this time. Patient has no dementia and no evidence of infection. 05/26/17 on evaluation today patient presents for evaluation concerning her bilateral lower extremity ulcer secondary to lymphedema. Fortunately she appears to be doing very well at this point in time. Specifically her right leg appears to be completely closed and healed. The left leg though there is still a small area of opening overall has healed. She does have a little bit of excoriation at the top or the wraps are because they slid down on her. Otherwise there's no evidence of infection and the other has improved dramatically now that she is not draining as significantly. Patient is having no significant pain she tells me she has been able to use her lymphedema pumps one time a day although that's not all she can tolerate. Even that is difficult for her. 06/02/17 on evaluation today patient's ulcers appear to be completely closed at this point. She does still note some odor but I believe this is due to the fact she has not been able to wash her legs as well currently. Fortunately she did bring her compression with her today. She does have the EXTREMIT-EASE Compression. With that being said she is somewhat nervous about going back to this as previously she broke out yet again once we  discontinue wrapping her. READMISSION 11/23/17; patient is now 74 year old diabetic woman with severe chronic venous insufficiency with lymphedema. We care for her last in this clinic in February 2019 at which time she had chronic venous insufficiency with lymphedema bilateral lower extremity ulcers. We discharged her with bilateral wrap around/extremitease stockings and compression pumps. Is fairly clear she is not using the compression pumps stating it causes pain in her knees. In any case she was admitted to hospital from 8/3 through 8/6 with a several week history of recurrent bilateral ulcers. She was felt to have bilateral cellulitis  treated with doxycycline IV. She is still on oral doxycycline and has another 2 days worth. At discharge her white count was 14.1 hemoglobin at 8.9. The patient is a diabetic however her last ABIs in 2018 showed a right ABI of 1.21, left of 1.35. TBI's were 0.76 on the right 0.75 on the left. Posterior tibial artery was monophasic on the right triphasic on the left. Dorsalis pedis triphasic on the right and triphasic on the left. She is not felt to have significant macrovascular disease 12/02/17;significant bilateral lower extremity wounds secondary to severe uncontrolled lymphedema. She did not get home health out to change the wraps. So she kept this on all week. She is telling me she using his her compression pumps once a week. In general all the wounds look better. We've been using silver alginate with secondary absorptive dressings 4 layer compression 12/10/17; bilateral lower extremity wounds secondary to uncontrolled lymphedema. She tells me she is using her compression pumps on most days. We've been keeping wraps on all week. Using silver alginate. She has wounds on the right lateral calf left medial and left lateral calf. All of this is somewhat better 12/16/17; the patient has 3 small open areas. One on the right lateral calf, one on the left medial calf  and the small area on the left lateral calf. She has lymphedema, chronic venous insufficiency with hemosiderin deposition. Very fibrotic distal lower extremity scan with suggestion of an inverted bottle sign appearance to her legs. She has compression pumps but she does not use them has not used them in the last week. Currently we have her in 4 layer compression, silver alginate to the wounds and this is being changed once by home health. She is noncompliant with the compression pumps because she says it hurts her knees. She also has compression stockings ando Juxta light stockings at home 12/24/17; the patient's right lateral calf is healed as well as the left medial. There is no open wound on the right leg. She still has 2 areas on the left lateral calf. We transitioned the right leg into an extremitease stocking which she brought in from home. 12/31/17; the patient has a superficial wound on the left lateral lower calf. We've been wrapping this leg. She arrives today with the right leg swollen but without a wound. She cannot get her extremities stocking on the right leg. There is a lot more edema here. She is not using her compression pumps 01/10/2018; patient has a same superficial wound on the left lateral calf. Her right leg has less peripheral edema. She tells me she also started herself on some Lasix that had been previously prescribed for her at some point but she had never taken it. She is also concerned about whether her compression pumps are leaking. She has extremitease stockings however the edema gets bad enough she can get these on. Currently she has no open wound on the right leg and is mention the edema is actually better than when I saw this 10 days ago 01/17/2018; patient has a same superficial area on the posterior left calf and a weeping area in the mid part of the right lateral calf. She still does not have anyone from [facility] coming to see her compression  pumps which she describes as being suspicious for a leakage. I am not really sure how frequently she is using this in any case. She has massive bilateral calf lymphedema. I think there is some spreading lymphedema into her posterior thighs.  01/24/2018; the patient's edema in her legs is actually some better. She still has a smaller area on the left posterior calf although this is better. The weeping area on the right lateral calf is also better. As it turned out she did not get her pumps from [facility] but [facility] and they are going to try to go out and see these this week which is going to help. I explained to her that she will use the compression pumps once a day while we are wrapping her legs but when she transitions to stockings that may be she will require pumping twice a day. Her compliance in this area has not been high and I wonder about her ability to do this herself. 02/01/2018; the area on the posterior left leg is healed. She still has the original wound on the right lateral calf which is epithelialized. The however just above this there is still weeping lymphedema fluid. She has a new area on the upper right calf which is either wrap injury or is she points out where she has been scratching under the wraps. We still not have managed to get a hold of a representative of [facility] to see if the pump is operational. She has extremitease stockings at home however I am not of the opinion that this will maintain her skin integrity without the compression pumps. 02/08/2018; posterior left leg remains healed although she has a new small open area on the left lateral calf. Right lateral calf has two quarter sized open areas and close juxtaposition. There is less weeping edema fluid. She apparently managed to contact [employer]. They are sending her a part. But she is still not received it 02/15/2018; the patient has no open wound on either leg. Some scale  present on the right upper lateral calf just below the fibular head. Edema control is excellent. She did not bring her stockings or her juxta lite wrap around. She has the part for her compression pump but she has not use the compression pumps yet READMISSION 01/10/2019 Patient is now a 74 year old woman that we have had in the clinic multiple times in the past. She has chronic lower extremity lymphedema, recurrent wounds on her bilateral lower extremities. She is also a type II diabetic reasonably well controlled with a recent hemoglobin A1c while she was in the hospital of 5.9. She has wounds on her bilateral lower extremities mostly on the left lateral and right lateral. These were noted also during her recent hospitalization. The patient states they have been there for a while. She has not been able to wear her stockings he has trouble getting them on as she lives alone. She also has external compression pumps but uses them sparingly because it causes pain in her knees which hurt because of osteoarthritis. The patient was on doxycycline before she went into the hospital apparently for fear of infection in her legs and is on Keflex coming out because of a UTI The patient was recently in the hospital from 01/06/2019 through 01/09/2019. She was admitted with nausea and vomiting and prerenal azotemia. This was corrected with IV fluid her Lasix has been put on hold. Hemoglobin A1c at 5.9 her metformin was discontinued. She was also felt to have a UTI she was prescribed Keflex at 503 times a day she is picking that up today. The patient is not felt to have an arterial issue. ABIs in our clinic were 1.24 on the right and 1.06 on the left.  She had formal studies in 2018 at which time her ABIs were 1.21 on the right TBI of 0.76 and on the left 1.35 and 0.75 respectively. Waveforms are on the right were monophasic and biphasic, triphasic and biphasic on the left. She is tolerated 4 layer compression in  the past 10/13. Comes in today having not used her compression pumps /perhaps once in the last week. She has too much edema to consider healing these wounds. We have been using silver alginate 10/20; she tells me he had she used her compression pumps once a day as we asked. Clearly she has edema out of her legs although most of it seems to be settling around her knees which is what she complains about. We have been using silver alginate to the extensive de-epithelialized area on the left lateral calf and a much smaller area on the right lateral extending linearly into the right posterior 10/27; she is using her compression pumps daily. Her edema control is better. We have been using silver alginate to the extensive wound areas on her bilateral lower legs. This includes left anterior left lateral and left posterior. Right anterior right lateral and right posterior. The wounds are not all in the same condition. She has severe bilateral skin damage with the epithelialization, flaking dried epithelium 11/3; she is using her compression pumps 1 times daily. Pretty obvious that her wraps fell down especially on the left leg to mid calf. I think things are improving on the right lateral calf, I do not see anything open posteriorly. The major areas on the left lateral where she has 3 or 4 deeper wounds in the midst of an entire de-epithelialized area. There is erythema here but no tenderness I think this represents venous stasis rather than cellulitis. I debrided the areas on the left lateral calf last week but once again they have tightly adherent debris over the top of them which is disappointing 11/10; the patient's major areas on the left lateral calf to 3 wounds with some depth surrounded by a large area of de-epithelialized tissue. There is no infection. We have been using silver alginate under 4-layer compression. On the right lateral she has a more contained wound area that is superficial. Been  using silver alginate under 4-layer compression 11/17; the patient's major wound is on the left lateral calf. We have been using silver alginate under compression. There is no open wound per se on the right but it de-epithelialized area on the lateral calf that is weeping edema fluid. I do not see much change here today. 12/1; patient arrives in clinic with her wounds looking better. Her edema control is better. The lymphedema specialist working on the left leg we have been working on the right. We have been using 4-layer compression. The patient is using a consider external compression pumps and Hydrofera Blue to the open areas. She is being measured for an abdominal compression pump and apparently that is going to go through her insurance 12/15; continued improvement. 2 small areas on the left and slightly larger 2 small areas on the right. 12/22; patient still has open areas on the right lateral calf which are superficial and the new one on the right anterior calf. She did not use her compression pumps this week. Her left leg is wrapped by our lymphedema specialist. There are no open wounds here 12/29; she still has a superficial area on the right lateral calf and area on the right medial calf. Right anterior wound area appears  to have closed. She has much better edema control today than last week 04/11/2019; the patient is here with severe bilateral lymphedema, chronic venous insufficiency with marked skin changes in her lower extremities. We are wrapping her right leg and her lymphedema specialist is putting lymphedema wraps on the left. She states she is using her compression pumps although in the past her compliance with uses been limited because of osteoarthritic pain in her knees. She only has one remaining area on the right leg however she has wrap injuries on the left at the upper level what where I think her wraps were cause. the left lateral area left posterior. These would be new this  week 1/12; generally better. The wrap injuries in the upper calf on the left from last week are down to a single wound. She has 2 wounds anteriorly that are still weeping fluid although these appear to have been epithelialized surface for the most part. There is no open wounds on the right leg we are wrapping her right leg and 4-layer compression and we are going a lymphedema wrap on the left leg. The the approach is a [place] 2000 wrap on the left leg and then subsequently the right leg. She claims to be compliant with her compression pumps 1/19; the patient has 1 tiny open area on the right medial calf. The rest of this is not open. She however has a large erythematous denuded area on the left anterior tibia extending laterally. There is no evidence of infection here. She has her new current waist high compression pumps 1/26; the patient has no open wounds on the right. On the left anteriorly much better than last week still a smattering of small weeping open areas. There is no evidence of infection 2/2; still no open wounds on the right leg. He still does not have a external compression garment. She arrives in clinic today with an open area on the plantar aspect of the left second toe at the base of the PIP. This apparently was painful last week so it was not wrapped by our lymphedema specialist. There was no open wound at the time. The patient denies traumatizing the toe however today she arrives with a small but deep wound in this area the dorsal surface of the toe is very tender. 2/11; she arrives in clinic today with everything epithelialized. She was that way last week on the right but we have still been wrapping. She has been using lymphedema wraps on the left. On careful inspection of the left leg she still has areas that look vulnerable but no clearly open wound there may be some weeping through cracks in the skin. She is using above the waist external compression pumps and she has a  separate external compression garment for her thighs. The plan is to discharge her for follow-up in lymphedema clinic with Juliann Pulse our lymphedema specialist. In the past healing this woman has resulted in relatively quick breakdown however she generally seems more motivated example willing to use her external compression pumps etc. Objective Constitutional Patient is hypertensive.. Pulse regular and within target range for patient.Marland Kitchen Respirations regular, non-labored and within target range.. Temperature is normal and within the target range for the patient.Marland Kitchen Appears in no distress. Vitals Time Taken: 11:55 AM, Height: 71 in, Weight: 240 lbs, BMI: 33.5, Temperature: 98.5 F, Pulse: 61 bpm, Respiratory Rate: 18 breaths/min, Blood Pressure: 144/64 mmHg. Respiratory work of breathing is normal. Cardiovascular Pedal pulses are palpable. Psychiatric appears at normal baseline. General  Notes: Wound exam; the patient has nothing open on the right leg on the left leg there is really nothing open on quick inspection however with illumination there may be some cracks in the skin that will threaten to leak lymphedema fluid I think this is going to be an ongoing issue. There is no evidence of infection and her edema control this is good in both legs as I have seen this. Integumentary (Hair, Skin) Changes of chronic hemosiderin deposition in her bilateral lower legs along with nonpitting edema. Wound #39 status is Open. Original cause of wound was Gradually Appeared. The wound is located on the Left,Anterior Lower Leg. The wound measures 0cm length x 0cm width x 0cm depth; 0cm^2 area and 0cm^3 volume. Wound #41 status is Open. Original cause of wound was Gradually Appeared. The wound is located on the Left,Plantar Toe Second. The wound measures 0cm length x 0cm width x 0cm depth; 0cm^2 area and 0cm^3 volume. Assessment Active Problems ICD-10 Lymphedema, not elsewhere classified Non-pressure chronic  ulcer of left calf limited to breakdown of skin Non-pressure chronic ulcer of other part of left foot with other specified severity Non-pressure chronic ulcer of right calf limited to breakdown of skin Type 2 diabetes mellitus with other skin ulcer Chronic venous hypertension (idiopathic) with inflammation of bilateral lower extremity Plan Discharge From [facility] Services: Discharge from [place] - call if any future wound care needs. Patient to follow with PT Juliann Pulse for Lymphedema. Edema Control: Patient to wear own compression stockings - daily according to PT [facility]. Elevate legs to the level of the heart or above for 30 minutes daily and/or when sitting, a frequency of: Exercise regularly 1. We are dissing charging her to the care of the lymphedema specialist who will help maintain compliance with recommendations. 2. I am hopeful with this follow-up that we will have a chance of maintaining skin integrity over time. The patient seems more compliant now in any case. She has above the waist external compression pumps, thigh high compression garments and she has been using lymphedema wraps both below the knee on the left we have been not wrapping the right but will stop that today. 3. I emphasized compliance for the patient as the key to maintaining skin integrity in these legs. Leg elevation, walking [which is problematic because of osteoarthritis] as a way to mobilize fluid and skin lubrication as additional factors Electronic Signature(s) Signed: 05/19/2019 1:06:39 PM By: Linton Ham MD Entered By: Linton Ham on 05/19/2019 07:32:02 -------------------------------------------------------------------------------- SuperBill Details Patient Name: Date of Service: HOLLACE, SHIRAISHI 05/18/2019 Medical Record [id number] Patient Account Number: [id number] Date of Birth/Sex: Treating RN: 07-Oct-1945 (74 y.o. Helene Shoe, Tammi Klippel Primary Care Provider: [name] Other  Clinician: Referring Provider: Treating Provider/Extender:Raynee Mccasland, Tenna Child, [place] Siren in Treatment: 18 Diagnosis Coding ICD-10 Codes Code Description I89.0 Lymphedema, not elsewhere classified L97.221 Non-pressure chronic ulcer of left calf limited to breakdown of skin L97.528 Non-pressure chronic ulcer of other part of left foot with other specified severity L97.211 Non-pressure chronic ulcer of right calf limited to breakdown of skin E11.622 Type 2 diabetes mellitus with other skin ulcer I87.323 Chronic venous hypertension (idiopathic) with inflammation of bilateral lower extremity Facility Procedures Physician Procedures CPT4 Code Description: [id number] - WC PHYS LEVEL 3 - EST PT ICD-10 Diagnosis Description L97.221 Non-pressure chronic ulcer of left calf limited to brea L97.211 Non-pressure chronic ulcer of right calf limited to bre I89.0 Lymphedema, not elsewhere  classified I87.323 Chronic venous hypertension (idiopathic) with inflammat extremity  Modifier: kdown of skin akdown of skin ion of bilateral Quantity: 1 lower Electronic Signature(s) Signed: 05/19/2019 1:06:39 PM By: Linton Ham MD Previous Signature: 05/18/2019 5:57:11 PM Version By: Deon Pilling Entered By: Linton Ham on 05/19/2019 07:32:30

## 2019-05-23 ENCOUNTER — Ambulatory Visit: Payer: PPO

## 2019-05-23 ENCOUNTER — Other Ambulatory Visit: Payer: Self-pay

## 2019-05-23 DIAGNOSIS — I890 Lymphedema, not elsewhere classified: Secondary | ICD-10-CM | POA: Diagnosis not present

## 2019-05-23 DIAGNOSIS — R2689 Other abnormalities of gait and mobility: Secondary | ICD-10-CM

## 2019-05-23 NOTE — Therapy (Signed)
[place], [place], [address] Phone: [phone]   Fax:  [phone]  Physical Therapy Treatment  Patient Details  Name: [name] [name] MRN: [id number] Date of Birth: June 02, 1945 Referring Provider (PT): Darci Current   Encounter Date: 05/23/2019  PT End of Session - 05/23/19 1356    Visit Number  23    Number of Visits  29    Date for PT Re-Evaluation  06/20/19    PT Start Time  1302    PT Stop Time  1342    PT Time Calculation (min)  40 min    Activity Tolerance  Patient tolerated treatment well    Behavior During Therapy  [facility] for tasks assessed/performed       Past Medical History:  Diagnosis Date  . Acute kidney injury ([place])   . Aortic stenosis, mild 11/17/2013  . Arthritis    "both knees, spine, back, fingers" (11/29/2013)  . CHF (congestive heart failure) ([place])   . Edema   . GERD (gastroesophageal reflux disease)   . Heart murmur   . History of diastolic dysfunction    Echo 09/9627 with diastolic dysfunction  . HTN (hypertension)   . Morbid obesity ([place])   . OA (osteoarthritis)   . PAC (premature atrial contraction)    per prior Holter  . Palpitations   . Pneumonia    "as a child"  . PVC's (premature ventricular contractions)    per prior Holter  . SVT (supraventricular tachycardia) ([place])   . Tachycardia    noted at 07/28/11 visit. Started on beta blocker. Possible atrial flutter vs long PR tachycardia/AVNRT  . Type II diabetes mellitus ([place])     Past Surgical History:  Procedure Laterality Date  . CESAREAN SECTION  1985  . CESAREAN SECTION  1985  . SUPRAVENTRICULAR TACHYCARDIA ABLATION  11/29/2013  . SUPRAVENTRICULAR TACHYCARDIA ABLATION N/A 11/29/2013   Procedure: SUPRAVENTRICULAR TACHYCARDIA ABLATION;  Surgeon: Evans Lance, MD;  Location: [facility] CATH LAB;  Service: Cardiovascular;  Laterality: N/A;  . US ECHOCARDIOGRAPHY  07/25/2008   EF 55-60%  . VAGINAL DELIVERY       There were no vitals filed for this visit.  Subjective Assessment - 05/23/19 1341    Subjective  Pt reports nothing new. She states that she left everything on since her last appointment.    Pertinent History  osteo arthritis, hx cellulitis, phlebitis and history of edema in BLE, Hx Acute kidney injury, CHF and DM II    Patient Stated Goals  I want to get this swelling down and heal my wound.    Currently in Pain?  Yes    Pain Score  8     Pain Location  Knee    Pain Orientation  Right;Left    Pain Descriptors / Indicators  Aching    Pain Type  Chronic pain    Pain Onset  More than a month ago    Pain Frequency  Intermittent    Aggravating Factors   movement    Pain Relieving Factors  rest                       [facility] Adult PT Treatment/Exercise - 05/23/19 0001      Manual Therapy   Manual Therapy  Compression Bandaging;Edema management    Manual therapy comments  Pt was washed with warm water after her bandages were removed. ABD pad was placed over the distal lateral L lower leg  and over the anterior R lower leg due to 2 cm diameter blisters due to bunching of garment. (see education)    Compression Bandaging  No toe wraps were used this session; folded size K tubigrip at the L thigh, folded size E tubigrip at the L lower leg with 1 artiflex at the ankle and lower leg for shaping and 1 8 cm for roman sandal/ankle sole heel then 2 10 cm spiraling up to below the knee. Pt had not removed the piece of reduction kit that was used at the wound clinic over the R lower leg. This was removed today. ABD pad was placed over anterior portion and re-applied discussed importance of removing and adjusting if it bunches to prevent blistering secondary to rubbing.              PT Education - 05/23/19 1354    Education Details  Pt will continue with the pump. She states that [employer] called her but she did not schedule becuase she thinks that her back is what is causing  the pain. Educated pt on importance of removing reduction kit piece from the RLE with any type of sliding or bunching due to blister formation and risk for further skin break down. IF she is unable to re-apply she will don a piece of tubigrip to decrease risk for re-opening venous ulcers.    Person(s) Educated  Patient    Methods  Explanation    Comprehension  Verbalized understanding       PT Short Term Goals - 04/04/19 1515      PT SHORT TERM GOAL #1   Title  Pt will be independent with HEP    Baseline  Pt is working on her HEP occasionally at home.    Time  5    Period  Weeks    Status  On-going        PT Long Term Goals - 04/04/19 1515      PT LONG TERM GOAL #1   Title  Patient will have reduction of L lower leg limb girth by 3 cm    Baseline  Pt will be measuremed next session she has significantly reduced from her initial evaluation.    Time  5    Status  On-going    Target Date  05/23/19      PT LONG TERM GOAL #2   Title  Patient to be properly fitted with compression garment to wear on daily basis.    Baseline  Pt has a juxtalite that is too small and old that ht evelcro is no longer usable on. Trying to get her a [employer].    Time  5    Period  Weeks    Status  On-going    Target Date  05/23/19      PT LONG TERM GOAL #3   Title  Patient will be independent in self-care management principles including self-massage/bandaging and long term management plan for edema.    Baseline  Pt has difficulty with managing her lymphedema. Trying to ger her a pump to help with swelling.    Time  5    Period  Weeks    Status  On-going    Target Date  05/23/19            Plan - 05/23/19 1356    Personal Factors and Comorbidities  Age;Comorbidity 3+    Comorbidities  CHF, acute kidney injury, DM II    Stability/Clinical Decision Making  Stable/Uncomplicated    Rehab Potential  Good    PT Frequency  2x / week    PT Duration  12 weeks    PT Treatment/Interventions   Electrical Stimulation;Cryotherapy;Gait training;Stair training;Functional mobility training;Therapeutic activities;Therapeutic exercise;Balance training;Neuromuscular re-education;Patient/family education;Manual lymph drainage;Compression bandaging;Passive range of motion;Taping;Vasopneumatic Device    PT Next Visit Plan  assess blisters on anterior R lower leg if pt has further skin break down or hasn't removed do not re-use apply TG soft. Assess tubigrip and light wrapping, assess pt use of tubigrip, give exercises, assess self MLD and possible work out pants?    PT Home Exercise Plan  pump and exercises    Consulted and Agree with Plan of Care  Patient       Patient will benefit from skilled therapeutic intervention in order to improve the following deficits and impairments:  Difficulty walking, Increased edema  Visit Diagnosis: Lymphedema  Other abnormalities of gait and mobility     Problem List Patient Active Problem List   Diagnosis Date Noted  . Postnasal drip 11/16/2017  . Sensorineural hearing loss (SNHL), bilateral 11/16/2017  . Tinnitus of both ears 11/16/2017  . Obesity, Class II, BMI 35-39.9, isolated (see actual BMI) 11/08/2017  . CKD (chronic kidney disease), stage III 11/08/2017  . Anemia 11/07/2017  . Thrombocytosis ([place]) 11/07/2017  . Lymphedema 11/07/2017  . Routine general medical examination at a health care facility 05/10/2014  . Paroxysmal SVT (supraventricular tachycardia) ([place]) 11/28/2013  . Physical deconditioning 11/23/2013  . Generalized weakness 11/17/2013  . Nausea 11/17/2013  . Multilevel spine pain 11/17/2013  . Aortic stenosis, mild 11/17/2013  . DM type 2 (diabetes mellitus, type 2) ([place]) 02/02/2013  . Arthritis 10/19/2012  . Mixed incontinence 10/19/2012  . GERD (gastroesophageal reflux disease) 04/20/2011  . HTN (hypertension) 01/20/2011  . Morbid obesity ([place])     Ander Purpura, PT 05/23/2019, 1:58 PM  [place], [place], [address] Phone: [phone]   Fax:  [phone]  Name: [name] [name] MRN: [id number] Date of Birth: 1945-07-30

## 2019-05-30 ENCOUNTER — Other Ambulatory Visit: Payer: Self-pay

## 2019-05-30 ENCOUNTER — Ambulatory Visit: Payer: PPO

## 2019-05-30 DIAGNOSIS — R2689 Other abnormalities of gait and mobility: Secondary | ICD-10-CM

## 2019-05-30 DIAGNOSIS — I890 Lymphedema, not elsewhere classified: Secondary | ICD-10-CM | POA: Diagnosis not present

## 2019-05-30 NOTE — Therapy (Signed)
[place], [place], [address] Phone: [phone]   Fax:  [phone]  Physical Therapy Treatment  Patient Details  Name: [name] [name] MRN: [id number] Date of Birth: 1946-01-17 Referring Provider (PT): Darci Current   Encounter Date: 05/30/2019  PT End of Session - 05/30/19 1352    Visit Number  24    Number of Visits  29    Date for PT Re-Evaluation  06/20/19    PT Start Time  1301    PT Stop Time  1344    PT Time Calculation (min)  43 min    Activity Tolerance  Patient tolerated treatment well    Behavior During Therapy  [facility] for tasks assessed/performed       Past Medical History:  Diagnosis Date  . Acute kidney injury ([place])   . Aortic stenosis, mild 11/17/2013  . Arthritis    "both knees, spine, back, fingers" (11/29/2013)  . CHF (congestive heart failure) ([place])   . Edema   . GERD (gastroesophageal reflux disease)   . Heart murmur   . History of diastolic dysfunction    Echo 08/1759 with diastolic dysfunction  . HTN (hypertension)   . Morbid obesity ([place])   . OA (osteoarthritis)   . PAC (premature atrial contraction)    per prior Holter  . Palpitations   . Pneumonia    "as a child"  . PVC's (premature ventricular contractions)    per prior Holter  . SVT (supraventricular tachycardia) ([place])   . Tachycardia    noted at 07/28/11 visit. Started on beta blocker. Possible atrial flutter vs long PR tachycardia/AVNRT  . Type II diabetes mellitus ([place])     Past Surgical History:  Procedure Laterality Date  . CESAREAN SECTION  1985  . CESAREAN SECTION  1985  . SUPRAVENTRICULAR TACHYCARDIA ABLATION  11/29/2013  . SUPRAVENTRICULAR TACHYCARDIA ABLATION N/A 11/29/2013   Procedure: SUPRAVENTRICULAR TACHYCARDIA ABLATION;  Surgeon: Evans Lance, MD;  Location: [facility] CATH LAB;  Service: Cardiovascular;  Laterality: N/A;  . US ECHOCARDIOGRAPHY  07/25/2008   EF 55-60%  . VAGINAL DELIVERY       There were no vitals filed for this visit.  Subjective Assessment - 05/30/19 1353    Subjective  Pt reports nothing new. She states that she removed her foot wrap on the L on Saturday to wash her bandages. She took the reduction kit piece off of her R lower leg due to she states she felt pain and it was digging into her skin.    Pertinent History  osteo arthritis, hx cellulitis, phlebitis and history of edema in BLE, Hx Acute kidney injury, CHF and DM II    Patient Stated Goals  I want to get this swelling down and heal my wound.    Currently in Pain?  Yes    Pain Score  8     Pain Location  Knee    Pain Orientation  Right;Left    Pain Descriptors / Indicators  Aching    Pain Onset  More than a month ago    Pain Frequency  Intermittent    Aggravating Factors   movement    Pain Relieving Factors  rest                       [facility] Adult PT Treatment/Exercise - 05/30/19 0001      Manual Therapy   Manual Therapy  Compression Bandaging;Edema management    Manual therapy  comments  Pt was washed with warm water after her bandages were removed. ABD pad was placed over the distal lateral L lower leg and allevyn plus ABD pad over the anterior R lower leg due to 2 cm open blister w/no sign of drainage or infection.    Compression Bandaging  No toe wraps were used this session; folded size K tubigrip at the L thigh, single layer size E tubigrip at the L lower leg with 1 artiflex at the ankle and lower leg for shaping and 1 8 cm for anchoring at the arch of the foot, then 2 10 cm spiraling up to below the knee. R lower leg had TG soft donned anlke to below the knee, Size K tubigrip single layer at the thigh, then 2 artiflex for shaping and spirtal wrap up from ankle to below the knee using 8 cm, 2 10 cm and 1 12 cm short stretch bandages.              PT Education - 05/30/19 1352    Education Details  Pt will continue with the pump. She states that she understood education to  remove wrap on the R lower leg if she notices any increase in edema in her R foot.    Person(s) Educated  Patient    Methods  Explanation    Comprehension  Verbalized understanding       PT Short Term Goals - 04/04/19 1515      PT SHORT TERM GOAL #1   Title  Pt will be independent with HEP    Baseline  Pt is working on her HEP occasionally at home.    Time  5    Period  Weeks    Status  On-going        PT Long Term Goals - 04/04/19 1515      PT LONG TERM GOAL #1   Title  Patient will have reduction of L lower leg limb girth by 3 cm    Baseline  Pt will be measuremed next session she has significantly reduced from her initial evaluation.    Time  5    Status  On-going    Target Date  05/23/19      PT LONG TERM GOAL #2   Title  Patient to be properly fitted with compression garment to wear on daily basis.    Baseline  Pt has a juxtalite that is too small and old that ht evelcro is no longer usable on. Trying to get her a [employer].    Time  5    Period  Weeks    Status  On-going    Target Date  05/23/19      PT LONG TERM GOAL #3   Title  Patient will be independent in self-care management principles including self-massage/bandaging and long term management plan for edema.    Baseline  Pt has difficulty with managing her lymphedema. Trying to ger her a pump to help with swelling.    Time  5    Period  Weeks    Status  On-going    Target Date  05/23/19            Plan - 05/30/19 1344    Clinical Impression Statement  Pt presents to physical therapy today with significant increase in edema in her R lower leg and the blisters on the anterior portion have burst. She has some weeping. No increased temperature, pain or rubor from previously. Bil Lower  legs were wrapped 4 short stretch bandages from ankle to below the knee on the R and 3 short stretch bandages from the foot to below the knee on the L. Piece of reduction kit is not appropriate for patient. Toes were not  wrapped on the L again and have no significant increase in edema. R foot has no significant edema and was not wrapped this session. Pt educated to remove the wrap on the R lower leg and apply tubigrip if her foot starts to swell or she has increased pain/tenderness. Tubigrip was used this session over the L thigh folded with the bottom piece longer than the top and one piece tubigrip single layer on the R knee/thigh. Pt will benefit form continued POC at this time.    Personal Factors and Comorbidities  Age;Comorbidity 3+    Comorbidities  CHF, acute kidney injury, DM II    Rehab Potential  Good    PT Frequency  2x / week    PT Duration  12 weeks    PT Treatment/Interventions  Electrical Stimulation;Cryotherapy;Gait training;Stair training;Functional mobility training;Therapeutic activities;Therapeutic exercise;Balance training;Neuromuscular re-education;Patient/family education;Manual lymph drainage;Compression bandaging;Passive range of motion;Taping;Vasopneumatic Device    PT Next Visit Plan  Assess TG soft and wrapping on RLE Assess tubigrip and light wrapping, assess pt use of tubigrip, give exercises, assess self MLD and possible work out pants?    PT Home Exercise Plan  pump and exercises    Consulted and Agree with Plan of Care  Patient       Patient will benefit from skilled therapeutic intervention in order to improve the following deficits and impairments:  Difficulty walking, Increased edema  Visit Diagnosis: Lymphedema  Other abnormalities of gait and mobility     Problem List Patient Active Problem List   Diagnosis Date Noted  . Postnasal drip 11/16/2017  . Sensorineural hearing loss (SNHL), bilateral 11/16/2017  . Tinnitus of both ears 11/16/2017  . Obesity, Class II, BMI 35-39.9, isolated (see actual BMI) 11/08/2017  . CKD (chronic kidney disease), stage III 11/08/2017  . Anemia 11/07/2017  . Thrombocytosis ([place]) 11/07/2017  . Lymphedema 11/07/2017  . Routine general  medical examination at a health care facility 05/10/2014  . Paroxysmal SVT (supraventricular tachycardia) ([place]) 11/28/2013  . Physical deconditioning 11/23/2013  . Generalized weakness 11/17/2013  . Nausea 11/17/2013  . Multilevel spine pain 11/17/2013  . Aortic stenosis, mild 11/17/2013  . DM type 2 (diabetes mellitus, type 2) ([place]) 02/02/2013  . Arthritis 10/19/2012  . Mixed incontinence 10/19/2012  . GERD (gastroesophageal reflux disease) 04/20/2011  . HTN (hypertension) 01/20/2011  . Morbid obesity [facility])     Ander Purpura, PT 05/30/2019, 1:58 PM  [place], [place], [address] Phone: [phone]   Fax:  [phone]  Name: Carolene Gitto MRN: [id number] Date of Birth: 04-Mar-1946

## 2019-06-01 ENCOUNTER — Other Ambulatory Visit: Payer: Self-pay

## 2019-06-01 ENCOUNTER — Ambulatory Visit: Payer: PPO

## 2019-06-01 DIAGNOSIS — I890 Lymphedema, not elsewhere classified: Secondary | ICD-10-CM

## 2019-06-01 DIAGNOSIS — R2689 Other abnormalities of gait and mobility: Secondary | ICD-10-CM

## 2019-06-01 NOTE — Therapy (Signed)
[place], [place], [address] Phone: [phone]   Fax:  [phone]  Physical Therapy Treatment  Patient Details  Name: [name] [name] MRN: [id number] Date of Birth: Oct 07, 1945 Referring Provider (PT): Darci Current   Encounter Date: 06/01/2019  PT End of Session - 06/01/19 1403    Visit Number  25    Number of Visits  29    Date for PT Re-Evaluation  06/20/19    PT Start Time  1400    PT Stop Time  1445    PT Time Calculation (min)  45 min    Activity Tolerance  Patient tolerated treatment well    Behavior During Therapy  [facility] for tasks assessed/performed       Past Medical History:  Diagnosis Date  . Acute kidney injury ([place])   . Aortic stenosis, mild 11/17/2013  . Arthritis    "both knees, spine, back, fingers" (11/29/2013)  . CHF (congestive heart failure) ([place])   . Edema   . GERD (gastroesophageal reflux disease)   . Heart murmur   . History of diastolic dysfunction    Echo [id number] with diastolic dysfunction  . HTN (hypertension)   . Morbid obesity ([place])   . OA (osteoarthritis)   . PAC (premature atrial contraction)    per prior Holter  . Palpitations   . Pneumonia    "as a child"  . PVC's (premature ventricular contractions)    per prior Holter  . SVT (supraventricular tachycardia) ([place])   . Tachycardia    noted at 07/28/11 visit. Started on beta blocker. Possible atrial flutter vs long PR tachycardia/AVNRT  . Type II diabetes mellitus ([place])     Past Surgical History:  Procedure Laterality Date  . CESAREAN SECTION  1985  . CESAREAN SECTION  1985  . SUPRAVENTRICULAR TACHYCARDIA ABLATION  11/29/2013  . SUPRAVENTRICULAR TACHYCARDIA ABLATION N/A 11/29/2013   Procedure: SUPRAVENTRICULAR TACHYCARDIA ABLATION;  Surgeon: Evans Lance, MD;  Location: [facility] CATH LAB;  Service: Cardiovascular;  Laterality: N/A;  . US ECHOCARDIOGRAPHY  07/25/2008   EF 55-60%  . VAGINAL DELIVERY       There were no vitals filed for this visit.  Subjective Assessment - 06/01/19 1402    Subjective  Pt reports that she has been pumping. She states that her R leg is still swollen and she hasn't noticed much of a difference.    Pertinent History  osteo arthritis, hx cellulitis, phlebitis and history of edema in BLE, Hx Acute kidney injury, CHF and DM II    Patient Stated Goals  I want to get this swelling down and heal my wound.    Currently in Pain?  Yes    Pain Score  8     Pain Location  Knee    Pain Orientation  Right;Left    Pain Descriptors / Indicators  Aching    Pain Type  Chronic pain    Pain Onset  More than a month ago    Pain Frequency  Intermittent    Aggravating Factors   movement    Pain Relieving Factors  rest                       [facility] Adult PT Treatment/Exercise - 06/01/19 0001      Manual Therapy   Manual Therapy  Compression Bandaging;Edema management    Manual therapy comments  Pt was washed with warm water after her bandages were removed. Allevyn plus  ABD pad over the anterior R lower leg and R lateral lower leg due to small amount of drainage. Open blisters are healing well. Discussed contacting MD due to redness, swelling and odor for possible treatment.     Compression Bandaging  No toe wraps were used this session; folded size K tubigrip at the L thigh, single layer size E tubigrip at the L lower leg with 1 artiflex at the ankle and lower leg for shaping and 1 8 cm for anchoring at the arch of the foot, then 2 10 cm spiraling up to below the knee. R lower leg had TG soft donned ankle to below the knee, Size K tubigrip single layer at the thigh, then 2 artiflex for shaping and spiral wrap up from ankle to below the knee using 8 cm, 2 10 cm and 1 12 cm short stretch bandages.              PT Education - 06/01/19 1453    Education Details  Pt will continue to pump. She will contact MD (this PT also contact MD clinic w/pt permission) due to  signs of possible infection in the RLE including redness, swelling that is difficult to go down and slight odor with small amount of drainage (dime size/opaque) at the R lateral lower leg.    Person(s) Educated  Patient    Methods  Explanation    Comprehension  Verbalized understanding       PT Short Term Goals - 04/04/19 1515      PT SHORT TERM GOAL #1   Title  Pt will be independent with HEP    Baseline  Pt is working on her HEP occasionally at home.    Time  5    Period  Weeks    Status  On-going        PT Long Term Goals - 04/04/19 1515      PT LONG TERM GOAL #1   Title  Patient will have reduction of L lower leg limb girth by 3 cm    Baseline  Pt will be measuremed next session she has significantly reduced from her initial evaluation.    Time  5    Status  On-going    Target Date  05/23/19      PT LONG TERM GOAL #2   Title  Patient to be properly fitted with compression garment to wear on daily basis.    Baseline  Pt has a juxtalite that is too small and old that ht evelcro is no longer usable on. Trying to get her a [employer].    Time  5    Period  Weeks    Status  On-going    Target Date  05/23/19      PT LONG TERM GOAL #3   Title  Patient will be independent in self-care management principles including self-massage/bandaging and long term management plan for edema.    Baseline  Pt has difficulty with managing her lymphedema. Trying to ger her a pump to help with swelling.    Time  5    Period  Weeks    Status  On-going    Target Date  05/23/19            Plan - 06/01/19 1403    Clinical Impression Statement  Pt continues with increased edema in her R lower leg; it is minimally better since her last session and blisters have begun to heal nicely wtih no drainge noted  on the anterior aspect of the leg. She continues with some weeping on the lateral R lower leg with a small amount of opaque drainage and odor. She was encouraged to contact the MD due to  signs of possible infection and this physical therapist contacted MD clinic for direction for pt. Her RLE was wrapped this session from ankle to below the knee in order to decrease risk of further swelling with direction to remove wrap if she has any increase in pain, numbness, tingling. LLE was wrapped from foot to below the knee. L toes and R foot were not wrapped this session after no significant increase in edema following same wrapping pattern last session. Continued with folded tubigrip at the L thigh/knee and single layer tubigrip at the R knee/thigh. Medi rep brought Juxtafit for the lower leg for pt today but the measurements are too small fora size Medium. Pt will benefit from continued POC at this time.    Personal Factors and Comorbidities  Age;Comorbidity 3+    Comorbidities  CHF, acute kidney injury, DM II    Rehab Potential  Good    PT Frequency  2x / week    PT Duration  12 weeks    PT Treatment/Interventions  Electrical Stimulation;Cryotherapy;Gait training;Stair training;Functional mobility training;Therapeutic activities;Therapeutic exercise;Balance training;Neuromuscular re-education;Patient/family education;Manual lymph drainage;Compression bandaging;Passive range of motion;Taping;Vasopneumatic Device    PT Next Visit Plan  Call Dr. Dellia Nims? Assess TG soft and wrapping on RLE Assess tubigrip and light wrapping, assess pt use of tubigrip, give exercises, assess self MLD and possible work out pants?    PT Home Exercise Plan  pump and exercises    Consulted and Agree with Plan of Care  Patient       Patient will benefit from skilled therapeutic intervention in order to improve the following deficits and impairments:  Difficulty walking, Increased edema  Visit Diagnosis: Lymphedema  Other abnormalities of gait and mobility     Problem List Patient Active Problem List   Diagnosis Date Noted  . Postnasal drip 11/16/2017  . Sensorineural hearing loss (SNHL), bilateral  11/16/2017  . Tinnitus of both ears 11/16/2017  . Obesity, Class II, BMI 35-39.9, isolated (see actual BMI) 11/08/2017  . CKD (chronic kidney disease), stage III 11/08/2017  . Anemia 11/07/2017  . Thrombocytosis ([place]) 11/07/2017  . Lymphedema 11/07/2017  . Routine general medical examination at a health care facility 05/10/2014  . Paroxysmal SVT (supraventricular tachycardia) ([place]) 11/28/2013  . Physical deconditioning 11/23/2013  . Generalized weakness 11/17/2013  . Nausea 11/17/2013  . Multilevel spine pain 11/17/2013  . Aortic stenosis, mild 11/17/2013  . DM type 2 (diabetes mellitus, type 2) ([place]) 02/02/2013  . Arthritis 10/19/2012  . Mixed incontinence 10/19/2012  . GERD (gastroesophageal reflux disease) 04/20/2011  . HTN (hypertension) 01/20/2011  . Morbid obesity ([place])     Ander Purpura, PT 06/01/2019, 3:00 PM  [place], [place], [address] Phone: [phone]   Fax:  [phone]  Name: [name] [name] MRN: [id number] Date of Birth: 1945-11-04

## 2019-06-01 NOTE — Progress Notes (Signed)
[name], [name] ([id number]) Visit Report for 04/11/2019 Arrival Information Details Patient Name: Date of Service: [name] [name], [name] [name] 04/11/2019 1:15 PM Medical Record [id number] Patient Account Number: [id number] Date of Birth/Sex: Treating RN: 1945/07/25 (74 y.o. Clearnce Sorrel Primary Care Pat Elicker: [name] Other Clinician: Referring Kallan Bischoff: Treating Nicolena Schurman/Extender:Robson, Tenna Child, [place] Siren in Treatment: 13 Visit Information History Since Last Visit Walker Added or deleted any medications: No Patient Arrived: Any new allergies or adverse reactions: No Arrival Time: 13:14 Had a fall or experienced change in No Accompanied By: self activities of daily living that may affect Transfer Assistance: None risk of falls: Patient Identification Verified: Yes Signs or symptoms of abuse/neglect since last No Secondary Verification Process Completed: Yes visito Patient Requires Transmission-Based No Hospitalized since last visit: No Precautions: Implantable device outside of the clinic excluding No Patient Has Alerts: No cellular tissue based products placed in the center since last visit: Has Dressing in Place as Prescribed: Yes Has Compression in Place as Prescribed: Yes Pain Present Now: No Electronic Signature(s) Signed: 04/11/2019 5:45:44 PM By: Kela Millin Entered By: Kela Millin on 04/11/2019 13:14:28 -------------------------------------------------------------------------------- Compression Therapy Details Patient Name: Date of Service: [name] [name], [name] [name] 04/11/2019 1:15 PM Medical Record [id number] Patient Account Number: [id number] Date of Birth/Sex: Treating RN: 07-Sep-1945 (74 y.o. [name] Primary Care Jazzmon Prindle: [name] Other Clinician: Referring Kirin Brandenburger: Treating Bernard Slayden/Extender:Robson, Tenna Child, [place] Siren in Treatment: 13 Compression Therapy Performed for Wound Non-Wound  Location Assessment: Performed By: Clinician Carlene Coria, RN Compression Type: Four Layer Location: Lower Extremity, Right Post Procedure Diagnosis Same as Pre-procedure Electronic Signature(s) Signed: 04/11/2019 5:08:58 PM By: Carlene Coria RN Entered By: Carlene Coria on 04/11/2019 14:28:03 -------------------------------------------------------------------------------- Encounter Discharge Information Details Patient Name: Date of Service: [name] [name], [name] [name] 04/11/2019 1:15 PM Medical Record [id number] Patient Account Number: [id number] Date of Birth/Sex: Treating RN: [birth year] (74 y.o. [name] [name] Primary Care Sohil Timko: [name] Other Clinician: Referring Cailynn Bodnar: Treating Draco Malczewski/Extender:Robson, Tenna Child, [place] Siren in Treatment: 13 Encounter Discharge Information Items Discharge Condition: Stable Ambulatory Status: Walker Discharge Destination: Home Transportation: Private Auto Accompanied By: alone Schedule Follow-up Appointment: Yes Clinical Summary of Care: Patient Declined Electronic Signature(s) Signed: 06/01/2019 2:23:51 PM By: Levan Hurst RN, BSN Entered By: Levan Hurst on 04/11/2019 14:37:21 -------------------------------------------------------------------------------- Lower Extremity Assessment Details Patient Name: Date of Service: [name], [name] 04/11/2019 1:15 PM Medical Record [id number] Patient Account Number: [id number] Date of Birth/Sex: Treating RN: 06-19-1945 (74 y.o. Clearnce Sorrel Primary Care Story Conti: [name] Other Clinician: Referring Trygve Thal: Treating Trai Ells/Extender:Robson, Tenna Child, [place] Siren in Treatment: 13 Edema Assessment Assessed: [Left: No] [Right: No] Edema: [Left: Ye] [Right: s] Calf Left: Right: Point of Measurement: 30 cm From Medial Instep cm 52 cm Ankle Left: Right: Point of Measurement: 8 cm From Medial Instep cm 29 cm Vascular  Assessment Pulses: Dorsalis Pedis Palpable: [Right:Yes] Electronic Signature(s) Signed: 04/11/2019 5:45:44 PM By: Kela Millin Entered By: Kela Millin on 04/11/2019 13:23:39 -------------------------------------------------------------------------------- Multi Wound Chart Details Patient Name: Date of Service: [name], [name] 04/11/2019 1:15 PM Medical Record [id number] Patient Account Number: [id number] Date of Birth/Sex: Treating RN: 12-05-1945 (74 y.o. F) Primary Care Johndavid Geralds: [name] Other Clinician: Referring Raidyn Breiner: Treating Chailyn Racette/Extender:Robson, Tenna Child, [place] Siren in Treatment: 13 Vital Signs Height(in): 71 Pulse(bpm): 61 Weight(lbs): 240 Blood Pressure(mmHg): 168/76 Body Mass Index(BMI): 33 Temperature(F): 97.8 Respiratory 19 Rate(breaths/min): Photos: Wound Location: Right Lower Leg - Lateral Left Lower Leg - Posterior Left Lower Leg - Anterior Wounding Event: Gradually Appeared Gradually Appeared Gradually Appeared Primary Etiology: Lymphedema Trauma, Other Lymphedema  Secondary Etiology: Diabetic Wound/Ulcer of the N/A N/A Lower Extremity Comorbid History: Cataracts, Lymphedema, Cataracts, Lymphedema, Cataracts, Lymphedema, Arrhythmia, Congestive Arrhythmia, Congestive Arrhythmia, Congestive Heart Failure, Hypertension, Heart Failure, Hypertension, Heart Failure, Hypertension, Peripheral Venous Disease, Peripheral Venous Disease, Peripheral Venous Disease, Type II Diabetes, Type II Diabetes, Type II Diabetes, Osteoarthritis, Neuropathy, Osteoarthritis, Neuropathy, Osteoarthritis, Neuropathy, Confinement Anxiety Confinement Anxiety Confinement Anxiety Date Acquired: 08/05/2018 04/08/2019 04/11/2019 Weeks of Treatment: 13 0 0 Wound Status: Healed - Epithelialized Open Open Measurements L x W x D 0x0x0 1.3x5x0.1 0.5x0.4x0.1 (cm) Area (cm) : 0 5.105 0.157 Volume (cm) : 0 0.511 0.016 % Reduction in Area: 100.00% 0.00%  0.00% % Reduction in Volume: 100.00% 0.00% 0.00% Classification: Full Thickness Without Full Thickness Without Partial Thickness Exposed Support Structures Exposed Support Structures Exudate Amount: None Present Small Small Exudate Type: N/A Serosanguineous N/A Exudate Color: N/A red, brown N/A Wound Margin: Flat and Intact Distinct, outline attached Distinct, outline attached Granulation Amount: None Present (0%) Large (67-100%) Large (67-100%) Granulation Quality: N/A Red, Pink Pink Necrotic Amount: None Present (0%) None Present (0%) Small (1-33%) Exposed Structures: Fascia: No Fat Layer (Subcutaneous Fat Layer (Subcutaneous Fat Layer (Subcutaneous Tissue) Exposed: Yes Tissue) Exposed: Yes Tissue) Exposed: No Fascia: No Fascia: No Tendon: No Tendon: No Tendon: No Muscle: No Muscle: No Muscle: No Joint: No Joint: No Joint: No Bone: No Bone: No Bone: No Epithelialization: None None N/A Treatment Notes Wound #38 (Left, Posterior Lower Leg) 1. Cleanse With Soap and water 2. Periwound Care Moisturizing lotion TCA Cream 3. Primary Dressing Applied Calcium Alginate Ag 4. Secondary Dressing ABD Pad Roll Gauze 5. Secured With Tape Notes Lymphedema wrap applied by PT Wound #39 (Left, Anterior Lower Leg) 1. Cleanse With Soap and water 2. Periwound Care Moisturizing lotion TCA Cream 3. Primary Dressing Applied Calcium Alginate Ag 4. Secondary Dressing ABD Pad Roll Gauze 5. Secured With Tape Notes Lymphedema wrap applied by PT Electronic Signature(s) Signed: 04/14/2019 5:14:53 AM By: Linton Ham MD Entered By: Linton Ham on 04/14/2019 04:57:58 -------------------------------------------------------------------------------- Multi-Disciplinary Care Plan Details Patient Name: Date of Service: RHIAN, MCCOMMON 04/11/2019 1:15 PM Medical Record [id number] Patient Account Number: [id number] Date of Birth/Sex: Treating RN: May 15, 1945 (74 y.o. [name] Primary Care Mahki Spikes: [name] Other Clinician: Referring Annalycia Done: Treating Kelissa Merlin/Extender:Robson, Tenna Child, [place] Si[place] in Treatment: 13 Active Inactive Wound/Skin Impairment Nursing Diagnoses: Knowledge deficit related to ulceration/compromised skin integrity Goals: Patient/caregiver will verbalize understanding of skin care regimen Date Initiated: 01/10/2019 Target Resolution Date: 04/14/2019 Goal Status: Active Ulcer/skin breakdown will have a volume reduction of 30% by week 4 Target Resolution Date Initiated: 01/10/2019 Date Inactivated: 02/14/2019 Date: 02/10/2019 Unmet Goal Status: Unmet Reason: comorbities/lymphedema Ulcer/skin breakdown will have a volume reduction of 50% by week 8 Target Resolution Date Initiated: 02/14/2019 Date Inactivated: 03/21/2019 Date: 03/17/2019 Unmet Goal Status: Unmet Reason: comorbities/lymphedema Ulcer/skin breakdown will have a volume reduction of 80% by week 12 Date Initiated: 03/21/2019 Target Resolution Date: 04/21/2019 Goal Status: Active Interventions: Assess patient/caregiver ability to obtain necessary supplies Assess patient/caregiver ability to perform ulcer/skin care regimen upon admission and as needed Assess ulceration(s) every visit Notes: Electronic Signature(s) Signed: 04/11/2019 5:08:58 PM By: Carlene Coria RN Entered By: Carlene Coria on 04/11/2019 14:00:11 -------------------------------------------------------------------------------- Pain Assessment Details Patient Name: Date of Service: [name] [name], [name] [name] 04/11/2019 1:15 PM Medical Record [id number] Patient Account Number: [id number] Date of Birth/Sex: Treating RN: 1945-10-19 (74 y.o. Clearnce Sorrel Primary Care Riva Sesma: [name] Other Clinician: Referring Chara Marquard: Treating Amyre Segundo/Extender:Robson, Tenna Child, [place] Siren in Treatment: [phone]  Active Problems Location of Pain Severity and Description of  Pain Patient Has Paino No Site Locations Pain Management and Medication Current Pain Management: Electronic Signature(s) Signed: 04/11/2019 5:45:44 PM By: Kela Millin Entered By: Kela Millin on 04/11/2019 13:15:32 -------------------------------------------------------------------------------- Patient/Caregiver Education Details Patient Name: Date of Service: [name] [name], [name] [name] 1/5/2021andnbsp1:15 PM Medical Record [id number] Patient Account Number: [id number] Date of Birth/Gender: Treating RN: 1945-04-20 (74 y.o. [name] Primary Care Physician: [name] Other Clinician: Referring Physician: Treating Physician/Extender:Robson, Tenna Child, [place] Siren in Treatment: 13 Education Assessment Education Provided To: Patient Education Topics Provided Wound/Skin Impairment: Methods: Explain/Verbal Responses: State content correctly [employer]) Signed: 04/11/2019 5:08:58 PM By: Carlene Coria RN Entered By: Carlene Coria on 04/11/2019 14:00:25 -------------------------------------------------------------------------------- Wound Assessment Details Patient Name: Date of Service: [name] [name], [name] [name] 04/11/2019 1:15 PM Medical Record [id number] Patient Account Number: [id number] Date of Birth/Sex: Treating RN: 11/18/1945 (74 y.o. Clearnce Sorrel Primary Care Virgie Kunda: [name] Other Clinician: Referring Jerad Dunlap: Treating Yong Wahlquist/Extender:Robson, Tenna Child, [place] Si[place] in Treatment: 13 Wound Status Wound Number: [id number] Primary Lymphedema Etiology: Wound Location: Right Lower Leg - Lateral Secondary Diabetic Wound/Ulcer of the Lower Extremity Wounding Event: Gradually Appeared Etiology: Date Acquired: 08/05/2018 Wound Healed - Epithelialized Weeks Of Treatment: 13 Status: Clustered Wound: No Comorbid Cataracts, Lymphedema, Arrhythmia, History: Congestive Heart Failure, Hypertension, Peripheral Venous  Disease, Type II Diabetes, Osteoarthritis, Neuropathy, Confinement Anxiety Photos Wound Measurements Length: (cm) 0 % Reduct Width: (cm) 0 % Reduct Depth: (cm) 0 Epitheli Area: (cm) 0 Tunneli Volume: (cm) 0 Undermi Wound Description Full Thickness Without Exposed Support Foul Odo Classification: Structures Slough/F Wound Flat and Intact Margin: Exudate None Present Amount: Wound Bed Granulation Amount: None Present (0%) Necrotic Amount: None Present (0%) Fascia E Fat Laye Tendon E Muscle E Joint Ex Bone Exp r After Cleansing: No ibrino No Exposed Structure xposed: No r (Subcutaneous Tissue) Exposed: No xposed: No xposed: No posed: No osed: No ion in Area: 100% ion in Volume: 100% alization: None ng: No ning: No Electronic Signature(s) Signed: 04/12/2019 3:43:47 PM By: Mikeal Hawthorne EMT/HBOT Signed: 04/13/2019 5:21:35 PM By: Kela Millin Previous Signature: 04/11/2019 5:45:44 PM Version By: Kela Millin Entered By: Mikeal Hawthorne on 04/12/2019 10:22:20 -------------------------------------------------------------------------------- Wound Assessment Details Patient Name: Date of Service: [name] [name], [name] [name] 04/11/2019 1:15 PM Medical Record [id number] Patient Account Number: [id number] Date of Birth/Sex: Treating RN: Nov 14, 1945 (74 y.o. Clearnce Sorrel Primary Care Giana Castner: [name] Other Clinician: Referring Inaara Tye: Treating Persephone Schriever/Extender:Robson, Tenna Child, [place] Si[place] in Treatment: 13 Wound Status Wound Number: 38 Primary Trauma, Other Etiology: Wound Location: Left Lower Leg - Posterior Wound Open Wounding Event: Gradually Appeared Status: Date Acquired: 04/08/2019 Comorbid Cataracts, Lymphedema, Arrhythmia, Weeks Of Treatment: 0 History: Congestive Heart Failure, Hypertension, Clustered Wound: No Peripheral Venous Disease, Type II Diabetes, Osteoarthritis, Neuropathy, Confinement Anxiety Photos Wound  Measurements Length: (cm) 1.3 % Reduct Width: (cm) 5 % Reduct Depth: (cm) 0.1 Epitheli Area: (cm) 5.105 Tunneli Volume: (cm) 0.511 Undermi Wound Description Classification: Full Thickness Without Exposed Support Foul Od Structures Slough/ Wound Distinct, outline attached Margin: Exudate Small Amount: Exudate Serosanguineous Type: Exudate red, brown Color: Wound Bed Granulation Amount: Large (67-100%) Granulation Quality: Red, Pink Fascia E Necrotic Amount: None Present (0%) Fat Laye Tendon E Muscle E Joint Ex Bone Exp or After Cleansing: No Fibrino No Exposed Structure xposed: No r (Subcutaneous Tissue) Exposed: Yes xposed: No xposed: No posed: No osed: No ion in Area: 0% ion in Volume: 0% alization: None ng: No ning: No Electronic Signature(s) Signed: 04/12/2019 3:43:47 PM  By: Mikeal Hawthorne EMT/HBOT Signed: 04/13/2019 5:21:35 PM By: Kela Millin Previous Signature: 04/11/2019 5:45:44 PM Version By: Kela Millin Entered By: Mikeal Hawthorne on 04/12/2019 10:00:13 -------------------------------------------------------------------------------- Wound Assessment Details Patient Name: Date of Service: [name] [name], [name] [name] 04/11/2019 1:15 PM Medical Record [id number] Patient Account Number: [id number] Date of Birth/Sex: Treating RN: Jan 19, 1946 (74 y.o. Clearnce Sorrel Primary Care Taiyo Kozma: [name] Other Clinician: Referring Dezyre Hoefer: Treating Hephzibah Strehle/Extender:Robson, Tenna Child, [place] Si[place] in Treatment: 13 Wound Status Wound Number: 39 Primary Lymphedema Etiology: Wound Location: Left Lower Leg - Anterior Wound Open Wounding Event: Gradually Appeared Status: Date Acquired: 04/11/2019 Comorbid Cataracts, Lymphedema, Arrhythmia, Weeks Of Treatment: 0 History: Congestive Heart Failure, Hypertension, Clustered Wound: No Peripheral Venous Disease, Type II Diabetes, Osteoarthritis, Neuropathy,  Confinement Anxiety Photos Wound Measurements Length: (cm) 0.5 Width: (cm) 0.4 Depth: (cm) 0.1 Area: (cm) 0.157 Volume: (cm) 0.016 Wound Description Classification: Partial Thickness Wound Margin: Distinct, outline attached Exudate Amount: Small Wound Bed Granulation Amount: Large (67-100%) Granulation Quality: Pink Necrotic Amount: Small (1-33%) Necrotic Quality: Adherent Slough Foul Odor After Cleansing: No Slough/Fibrino Yes Exposed Structure Fascia Exposed: N Fat Layer (Subcutaneous Tissue) Exposed: Y Tendon Exposed: N Muscle Exposed: N Joint Exposed: N Bone Exposed: N % Reduction in Area: 0% % Reduction in Volume: 0% Tunneling: No Undermining: No o es o o o o Engineer, maintenance) Signed: 04/12/2019 3:43:47 PM By: Mikeal Hawthorne EMT/HBOT Signed: 04/13/2019 5:21:35 PM By: Kela Millin Previous Signature: 04/11/2019 5:45:44 PM Version By: Kela Millin Entered By: Mikeal Hawthorne on 04/12/2019 10:00:36 -------------------------------------------------------------------------------- Vitals Details Patient Name: Date of Service: [name] [name], [name] [name] 04/11/2019 1:15 PM Medical Record [id number] Patient Account Number: [id number] Date of Birth/Sex: Treating RN: 1945/04/15 (74 y.o. Clearnce Sorrel Primary Care Shaunie Boehm: [name] Other Clinician: Referring Linzie Criss: Treating Zillah Alexie/Extender:Robson, Tenna Child, [place] Siren in Treatment: 13 Vital Signs Time Taken: 13:15 Temperature (F): 97.8 Height (in): 71 Pulse (bpm): 66 Weight (lbs): 240 Respiratory Rate (breaths/min): 19 Body Mass Index (BMI): 33.5 Blood Pressure (mmHg): 168/76 Reference Range: 80 - 120 mg / dl Electronic Signature(s) Signed: 04/11/2019 5:45:44 PM By: Kela Millin Entered By: Kela Millin on 04/11/2019 13:15:25

## 2019-06-05 ENCOUNTER — Telehealth: Payer: Self-pay

## 2019-06-05 NOTE — Telephone Encounter (Signed)
Called pt, LVM.   

## 2019-06-05 NOTE — Telephone Encounter (Signed)
The last time she saw Dr. Dellia Nims was February and his note indicates no signs of infection at that time. Has something changed with her wounds? Perhaps we can do virtual visit to discuss.

## 2019-06-05 NOTE — Telephone Encounter (Signed)
Patient calling and states that her wound care doctor, Dr Dellia Nims, advised that she needed an antibiotic. States that he advised her to go through her PCP. States that Dr [name] also mentioned an Iron supplement before. Would like to know what specific one she should get? Please advise. Would like a call back to discuss.

## 2019-06-05 NOTE — Telephone Encounter (Signed)
Pt called back and has made a phone visit for 3/2 @8 .20am to discuss. She said that it has been changes since last month.

## 2019-06-06 ENCOUNTER — Encounter: Payer: Self-pay | Admitting: Internal Medicine

## 2019-06-06 ENCOUNTER — Ambulatory Visit (INDEPENDENT_AMBULATORY_CARE_PROVIDER_SITE_OTHER): Payer: PPO | Admitting: Internal Medicine

## 2019-06-06 ENCOUNTER — Ambulatory Visit: Payer: PPO | Attending: Internal Medicine

## 2019-06-06 ENCOUNTER — Other Ambulatory Visit: Payer: Self-pay | Admitting: Internal Medicine

## 2019-06-06 ENCOUNTER — Other Ambulatory Visit: Payer: Self-pay

## 2019-06-06 DIAGNOSIS — R2689 Other abnormalities of gait and mobility: Secondary | ICD-10-CM | POA: Insufficient documentation

## 2019-06-06 DIAGNOSIS — I890 Lymphedema, not elsewhere classified: Secondary | ICD-10-CM

## 2019-06-06 MED ORDER — DOXYCYCLINE HYCLATE 100 MG PO TABS: 100.0000 mg | ORAL_TABLET | Freq: Two times a day (BID) | ORAL | 0 refills | Status: DC

## 2019-06-06 NOTE — Progress Notes (Signed)
Virtual Visit via Audio Note  I connected with [name] [name] on 06/06/19 at  8:20 AM EST by an audio-only enabled telemedicine application and verified that I am speaking with the correct person using two identifiers.  The patient and the provider were at separate locations throughout the entire encounter.   I discussed the limitations of evaluation and management by telemedicine and the availability of in person appointments. The patient expressed understanding and agreed to proceed. The patient and the provider were the only parties present for the visit unless noted in HPI below.  History of Present Illness: The patient is a 74 y.o. female with visit for concerns about wound infection on her legs. Has been seeing wound care and lymphedema clinic. When she saw them recently they were concerned about new redness on the right lower leg. Started with last wound change last Thursday. She is going twice a week at this time and same person is helping her each time. Has called wound doctor and he wanted her to go through [place] for antibiotics if needed. Denies fevers or chills or nausea or vomiting. Overall it is not hurting. Has tried nothing.  Observations/Objective: Voice strong, no coughing or dyspnea during visit, mental status is A and O times 3  Assessment and Plan: See problem oriented charting  Follow Up Instructions: rx doxycycline 2 week course and have lymphedema clinic continue to monitor. Call back if worsening or not improving.   Visit time 12 minutes in non-face to face communication with patient and coordination of care  I discussed the assessment and treatment plan with the patient. The patient was provided an opportunity to ask questions and all were answered. The patient agreed with the plan and demonstrated an understanding of the instructions.   The patient was advised to call back or seek an in-person evaluation if the symptoms worsen or if the condition fails to improve as  anticipated.  Hoyt Koch, MD

## 2019-06-06 NOTE — Therapy (Signed)
[place], [place], [address] Phone: [phone]   Fax:  [phone]  Physical Therapy Treatment  Patient Details  Name: [name] [name] MRN: [id number] Date of Birth: Jul 11, 1945 Referring Provider (PT): Darci Current   Encounter Date: 06/06/2019  PT End of Session - 06/06/19 1315    Visit Number  26    Number of Visits  29    Date for PT Re-Evaluation  06/20/19    PT Start Time  1207    PT Stop Time  1303    PT Time Calculation (min)  56 min    Activity Tolerance  Patient tolerated treatment well    Behavior During Therapy  [facility] for tasks assessed/performed       Past Medical History:  Diagnosis Date  . Acute kidney injury ([place])   . Aortic stenosis, mild 11/17/2013  . Arthritis    "both knees, spine, back, fingers" (11/29/2013)  . CHF (congestive heart failure) ([place])   . Edema   . GERD (gastroesophageal reflux disease)   . Heart murmur   . History of diastolic dysfunction    Echo [id number] with diastolic dysfunction  . HTN (hypertension)   . Morbid obesity ([place])   . OA (osteoarthritis)   . PAC (premature atrial contraction)    per prior Holter  . Palpitations   . Pneumonia    "as a child"  . PVC's (premature ventricular contractions)    per prior Holter  . SVT (supraventricular tachycardia) ([place])   . Tachycardia    noted at 07/28/11 visit. Started on beta blocker. Possible atrial flutter vs long PR tachycardia/AVNRT  . Type II diabetes mellitus ([place])     Past Surgical History:  Procedure Laterality Date  . CESAREAN SECTION  1985  . CESAREAN SECTION  1985  . SUPRAVENTRICULAR TACHYCARDIA ABLATION  11/29/2013  . SUPRAVENTRICULAR TACHYCARDIA ABLATION N/A 11/29/2013   Procedure: SUPRAVENTRICULAR TACHYCARDIA ABLATION;  Surgeon: Evans Lance, MD;  Location[facility] C[facility] LAB;  Service: Cardiovascular;  Laterality: N/A;  . US ECHOCARDIOGRAPHY  07/25/2008   EF 55-60%  . VAGINAL DELIVERY       There were no vitals filed for this visit.  Subjective Assessment - 06/06/19 1313    Subjective  Pt reports that she continues to use the pump. She states that she got an antibiotic form her PCP [name] for possible infection.    Pertinent History  osteo arthritis, hx cellulitis, phlebitis and history of edema in BLE, Hx Acute kidney injury, CHF and DM II    Patient Stated Goals  I want to get this swelling down and heal my wound.    Currently in Pain?  Yes    Pain Score  8     Pain Location  Knee    Pain Orientation  Right;Left    Pain Descriptors / Indicators  Aching    Pain Type  Chronic pain                       [facility] Adult PT Treatment/Exercise - 06/06/19 0001      Manual Therapy   Manual Therapy  Compression Bandaging;Edema management    Manual therapy comments  Pt was washed with warm water after her bandages were removed. Allevyn plus ABD pad over the anterior R lower leg and R lateral lower leg due to small amount of drainage. Open blisters are healing well. Continues with small amount of seorus drainage at the R  lower leg. Blisters have completely healed closed.     Compression Bandaging  No toe wraps were used this session; folded size K tubigrip at the L thigh, single layer size E tubigrip at the L lower leg with 1 artiflex at the ankle and lower leg for shaping and 1 8 cm for anchoring at the arch of the foot, then 2 10 cm spiraling up to below the knee. R lower leg had TG soft donned ankle to below the knee, Size K tubigrip single layer at the thigh, then 2 artiflex for shaping and spiral wrap up from ankle to below the knee using 8 cm, 2 10 cm and 1 12 cm short stretch bandages.              PT Education - 06/06/19 1315    Education Details  Discussed edema wear and pt agreed to contact PCP for possible hep from social work for financial assistance with compression due to lack of insurance coverage.    Person(s) Educated  Patient     Methods  Explanation    Comprehension  Verbalized understanding       PT Short Term Goals - 04/04/19 1515      PT SHORT TERM GOAL #1   Title  Pt will be independent with HEP    Baseline  Pt is working on her HEP occasionally at home.    Time  5    Period  Weeks    Status  On-going        PT Long Term Goals - 04/04/19 1515      PT LONG TERM GOAL #1   Title  Patient will have reduction of L lower leg limb girth by 3 cm    Baseline  Pt will be measuremed next session she has significantly reduced from her initial evaluation.    Time  5    Status  On-going    Target Date  05/23/19      PT LONG TERM GOAL #2   Title  Patient to be properly fitted with compression garment to wear on daily basis.    Baseline  Pt has a juxtalite that is too small and old that ht evelcro is no longer usable on. Trying to get her a [employer].    Time  5    Period  Weeks    Status  On-going    Target Date  05/23/19      PT LONG TERM GOAL #3   Title  Patient will be independent in self-care management principles including self-massage/bandaging and long term management plan for edema.    Baseline  Pt has difficulty with managing her lymphedema. Trying to ger her a pump to help with swelling.    Time  5    Period  Weeks    Status  On-going    Target Date  05/23/19            Plan - 06/06/19 1315    Clinical Impression Statement  Pt presents to physical therapy with decrease edam in her R lower leg. She continues with serous drainage at the R lateral leg and posterior leg. Small blisters on the anterior portion have completely healed up with no drainge present. Pt legs were washed and R lower leg was dressed with allevyn/ABD pad prior to compression wrap. BLE were wrapped from foot to below the knee with short stretch bandages and minimal bandaging on the feet due to no significant increase in swelling  and pt needs to be able to don her shoes. Discussed compression options and contacting her PCP  for possible referral to social work to help financially with compression garment purchaes. Pt will benefit from continued POC a this time.    Personal Factors and Comorbidities  Age;Comorbidity 3+    Comorbidities  CHF, acute kidney injury, DM II    PT Frequency  2x / week    PT Duration  12 weeks    PT Treatment/Interventions  Electrical Stimulation;Cryotherapy;Gait training;Stair training;Functional mobility training;Therapeutic activities;Therapeutic exercise;Balance training;Neuromuscular re-education;Patient/family education;Manual lymph drainage;Compression bandaging;Passive range of motion;Taping;Vasopneumatic Device    PT Next Visit Plan  Continue with wrapping, help order garments if needed. Ask if she has spoke to Education officer, museum?    PT Home Exercise Plan  pump and exercises    Consulted and Agree with Plan of Care  Patient       Patient will benefit from skilled therapeutic intervention in order to improve the following deficits and impairments:  Difficulty walking, Increased edema  Visit Diagnosis: Lymphedema  Other abnormalities of gait and mobility     Problem List Patient Active Problem List   Diagnosis Date Noted  . Postnasal drip 11/16/2017  . Sensorineural hearing loss (SNHL), bilateral 11/16/2017  . Tinnitus of both ears 11/16/2017  . Obesity, Class II, BMI 35-39.9, isolated (see actual BMI) 11/08/2017  . CKD (chronic kidney disease), stage III 11/08/2017  . Anemia 11/07/2017  . Thrombocytosis ([place]) 11/07/2017  . Lymphedema 11/07/2017  . Routine general medical examination at a health care facility 05/10/2014  . Paroxysmal SVT (supraventricular tachycardia) ([place]) 11/28/2013  . Physical deconditioning 11/23/2013  . Generalized weakness 11/17/2013  . Nausea 11/17/2013  . Multilevel spine pain 11/17/2013  . Aortic stenosis, mild 11/17/2013  . DM type 2 (diabetes mellitus, type 2) ([place]) 02/02/2013  . Arthritis 10/19/2012  . Mixed incontinence 10/19/2012  .  GERD (gastroesophageal reflux disease) 04/20/2011  . HTN (hypertension) 01/20/2011  . Morbid obesity ([place])     Ander Purpura, PT 06/06/2019, 1:19 PM  [place], [place], [address] Phone: [phone]   Fax:  [phone]  Name: [name] [name] MRN: [id number] Date of Birth: June 06, 1945

## 2019-06-06 NOTE — Assessment & Plan Note (Signed)
With concurrent infection now. Rx doxycycline 2 week course and continue with lymphedema clinic twice a week.

## 2019-06-07 NOTE — Patient Outreach (Signed)
Received a referral from Dr. [name], patient has [employer] insurance. Following their workflow I have sent it to [email or web address] for Care Management.

## 2019-06-08 ENCOUNTER — Other Ambulatory Visit: Payer: Self-pay

## 2019-06-08 ENCOUNTER — Ambulatory Visit: Payer: PPO

## 2019-06-08 DIAGNOSIS — I890 Lymphedema, not elsewhere classified: Secondary | ICD-10-CM

## 2019-06-08 DIAGNOSIS — R2689 Other abnormalities of gait and mobility: Secondary | ICD-10-CM

## 2019-06-08 NOTE — Therapy (Signed)
[place], [place], [address] Phone: [phone]   Fax:  [phone]  Physical Therapy Treatment  Patient Details  Name: [name] [name] MRN: [id number] Date of Birth: 05/30/1945 Referring Provider (PT): Darci Current   Encounter Date: 06/08/2019  PT End of Session - 06/08/19 1317    Visit Number  27    Number of Visits  29    Date for PT Re-Evaluation  06/20/19    PT Start Time  [id number]    PT Stop Time  [id number]    PT Time Calculation (min)  53 min    Activity Tolerance  Patient tolerated treatment well    Behavior During Therapy  [facility] for tasks assessed/performed       Past Medical History:  Diagnosis Date  . Acute kidney injury ([place])   . Aortic stenosis, mild 11/17/2013  . Arthritis    "both knees, spine, back, fingers" (11/29/2013)  . CHF (congestive heart failure) ([place])   . Edema   . GERD (gastroesophageal reflux disease)   . Heart murmur   . History of diastolic dysfunction    Echo [id number] with diastolic dysfunction  . HTN (hypertension)   . Morbid obesity ([place])   . OA (osteoarthritis)   . PAC (premature atrial contraction)    per prior Holter  . Palpitations   . Pneumonia    "as a child"  . PVC's (premature ventricular contractions)    per prior Holter  . SVT (supraventricular tachycardia) ([place])   . Tachycardia    noted at 07/28/11 visit. Started on beta blocker. Possible atrial flutter vs long PR tachycardia/AVNRT  . Type II diabetes mellitus ([place])     Past Surgical History:  Procedure Laterality Date  . CESAREAN SECTION  1985  . CESAREAN SECTION  1985  . SUPRAVENTRICULAR TACHYCARDIA ABLATION  11/29/2013  . SUPRAVENTRICULAR TACHYCARDIA ABLATION N/A 11/29/2013   Procedure: SUPRAVENTRICULAR TACHYCARDIA ABLATION;  Surgeon: Evans Lance, MD;  Location[facility] CATH LAB;  Service: Cardiovascular;  Laterality: N/A;  . US ECHOCARDIOGRAPHY  07/25/2008   EF 55-60%  . VAGINAL DELIVERY       There were no vitals filed for this visit.  Subjective Assessment - 06/08/19 1318    Subjective  Pt reports that she has been feeling better. She states that she started antibiotic.    Pertinent History  osteo arthritis, hx cellulitis, phlebitis and history of edema in BLE, Hx Acute kidney injury, CHF and DM II    Patient Stated Goals  I want to get this swelling down and heal my wound.    Currently in Pain?  No/denies    Pain Score  0-No pain                       [facility] Adult PT Treatment/Exercise - 06/08/19 0001      Manual Therapy   Manual Therapy  Compression Bandaging;Edema management    Manual therapy comments  Pt was washed with warm water after her bandages were removed. Allevyn plus ABD pad over the posterior R lower leg and R lateral lower leg due to small amount of drainage. Open blisters have healed. Continues with small amount of serous drainage at the R lower leg.     Edema Management  Pt was assisted with purchasing Medium size edema wear on [email or web address]. she was provided with receipt.     Compression Bandaging  No toe wraps were used this session; folded size K  tubigrip at the L thigh, single layer single layer TG soft at the L lower leg with 1 artiflex at the ankle and lower leg for shaping and 1 8 cm for anchoring at the arch of the foot, then 2 10 cm spiraling up to below the knee. R lower leg size E tubigrip donned over the knee and doubled over the lower leg ankle to below the knee, Size K tubigrip single layer at the thigh, then 2 artiflex for shaping and spiral wrap up from ankle to below the knee using 8 cm, 2 10 cm and 1 12 cm short stretch bandages.              PT Education - 06/08/19 1409    Education Details  Pt will continue to pump and wear her wraps.    Person(s) Educated  Patient    Methods  Explanation    Comprehension  Verbalized understanding       PT Short Term Goals - 04/04/19 1515      PT SHORT TERM GOAL #1    Title  Pt will be independent with HEP    Baseline  Pt is working on her HEP occasionally at home.    Time  5    Period  Weeks    Status  On-going        PT Long Term Goals - 04/04/19 1515      PT LONG TERM GOAL #1   Title  Patient will have reduction of L lower leg limb girth by 3 cm    Baseline  Pt will be measuremed next session she has significantly reduced from her initial evaluation.    Time  5    Status  On-going    Target Date  05/23/19      PT LONG TERM GOAL #2   Title  Patient to be properly fitted with compression garment to wear on daily basis.    Baseline  Pt has a juxtalite that is too small and old that ht evelcro is no longer usable on. Trying to get her a [employer].    Time  5    Period  Weeks    Status  On-going    Target Date  05/23/19      PT LONG TERM GOAL #3   Title  Patient will be independent in self-care management principles including self-massage/bandaging and long term management plan for edema.    Baseline  Pt has difficulty with managing her lymphedema. Trying to ger her a pump to help with swelling.    Time  5    Period  Weeks    Status  On-going    Target Date  05/23/19            Plan - 06/08/19 1317    Clinical Impression Statement  Pt continues with decreased edema in her R lower leg and maintaining at the L. She continues with serous drainage at the R lateral leg and posterior leg. The blisters have fully healed that were present on the R anterior lower leg following weraing the velcro wrap. Pt bil lower legs were washed and R lower leg was dressed with allevyn/ABD pads prior to compression wrap. BLE were wrapped from foot to below the knee with short stretch bandages with minmal bandaging on the feet so that pt is able to don her shoes and ambulate/transfer safely. She continues with very minimal increase in edema in the toes. Pt was assisted with help purchasing  [employer] the for BLE due to she does not have a computer. Tubigrip  continues to be used at the Bil thighs/knees for edema management. Pt will benefit from current POC at this time.    Personal Factors and Comorbidities  Age;Comorbidity 3+    Comorbidities  CHF, acute kidney injury, DM II    Rehab Potential  Good    PT Frequency  2x / week    PT Duration  12 weeks    PT Treatment/Interventions  Electrical Stimulation;Cryotherapy;Gait training;Stair training;Functional mobility training;Therapeutic activities;Therapeutic exercise;Balance training;Neuromuscular re-education;Patient/family education;Manual lymph drainage;Compression bandaging;Passive range of motion;Taping;Vasopneumatic Device    PT Next Visit Plan  Continue with wrapping, help order garments if needed. Ask if she has spoke to Education officer, museum?    PT Home Exercise Plan  pump and exercises    Consulted and Agree with Plan of Care  Patient       Patient will benefit from skilled therapeutic intervention in order to improve the following deficits and impairments:  Difficulty walking, Increased edema  Visit Diagnosis: Lymphedema  Other abnormalities of gait and mobility     Problem List Patient Active Problem List   Diagnosis Date Noted  . Postnasal drip 11/16/2017  . Sensorineural hearing loss (SNHL), bilateral 11/16/2017  . Tinnitus of both ears 11/16/2017  . Obesity, Class II, BMI 35-39.9, isolated (see actual BMI) 11/08/2017  . CKD (chronic kidney disease), stage III 11/08/2017  . Anemia 11/07/2017  . Thrombocytosis ([place]) 11/07/2017  . Lymphedema 11/07/2017  . Routine general medical examination at a health care facility 05/10/2014  . Paroxysmal SVT (supraventricular tachycardia) ([place]) 11/28/2013  . Physical deconditioning 11/23/2013  . Generalized weakness 11/17/2013  . Nausea 11/17/2013  . Multilevel spine pain 11/17/2013  . Aortic stenosis, mild 11/17/2013  . DM type 2 (diabetes mellitus, type 2) ([place]) 02/02/2013  . Arthritis 10/19/2012  . Mixed incontinence 10/19/2012  .  GERD (gastroesophageal reflux disease) 04/20/2011  . HTN (hypertension) 01/20/2011  . Morbid obesity ([place])     Ander Purpura, PT 06/08/2019, 2:13 PM  [place], [place], [address] Phone: [phone]   Fax:  [phone]  Name: [name] [name] MRN: [id number] Date of Birth: 1945-07-24

## 2019-06-09 ENCOUNTER — Telehealth: Payer: Self-pay | Admitting: Internal Medicine

## 2019-06-09 NOTE — Progress Notes (Signed)
°  Chronic Care Management   Outreach Note  06/09/2019 Name: [name] [name] MRN: [id number] DOB: 02-22-1946  Referred by: Hoyt Koch, MD Reason for referral : No chief complaint on file.   An unsuccessful telephone outreach was attempted today. The patient was referred to the pharmacist for assistance with care management and care coordination.   Follow Up Plan:   Raynicia Dukes [employer] Scheduler

## 2019-06-12 ENCOUNTER — Telehealth: Payer: Self-pay | Admitting: Internal Medicine

## 2019-06-12 DIAGNOSIS — N1831 Chronic kidney disease, stage 3a: Secondary | ICD-10-CM

## 2019-06-12 NOTE — Chronic Care Management (AMB) (Signed)
  Chronic Care Management   Note  06/12/2019 Name: [name] [name] MRN: [id number] DOB: 1945-04-19  [name] [name] is a 74 y.o. year old female who is a primary care patient of Hoyt Koch, MD. I reached out to [place] by phone today in response to a referral sent by [name]'s PCP, Hoyt Koch, MD.   [name] was given information about Chronic Care Management services today including:  1. CCM service includes personalized support from designated clinical staff supervised by her physician, including individualized plan of care and coordination with other care providers 2. 24/7 contact phone numbers for assistance for urgent and routine care needs. 3. Service will only be billed when office clinical staff spend 20 minutes or more in a month to coordinate care. 4. Only one practitioner may furnish and bill the service in a calendar month. 5. The patient may stop CCM services at any time (effective at the end of the month) by phone call to the office staff.   Patient agreed to services and verbal consent obtained.   Follow up plan:   Raynicia Dukes [employer] Scheduler

## 2019-06-13 ENCOUNTER — Other Ambulatory Visit: Payer: Self-pay

## 2019-06-13 ENCOUNTER — Ambulatory Visit: Payer: PPO

## 2019-06-13 DIAGNOSIS — I890 Lymphedema, not elsewhere classified: Secondary | ICD-10-CM

## 2019-06-13 DIAGNOSIS — R2689 Other abnormalities of gait and mobility: Secondary | ICD-10-CM

## 2019-06-13 NOTE — Therapy (Signed)
[place], [place], [address] Phone: [phone]   Fax:  [phone]  Physical Therapy Treatment  Patient Details  Name: Cristol Chilcote MRN: [id number] Date of Birth: 1945/08/26 Referring Provider (PT): Darci Current   Encounter Date: 06/13/2019  PT End of Session - 06/13/19 1305    Visit Number  28    Number of Visits  29    Date for PT Re-Evaluation  06/20/19    PT Start Time  1304    PT Stop Time  1345    PT Time Calculation (min)  41 min    Activity Tolerance  Patient tolerated treatment well    Behavior During Therapy  [facility] for tasks assessed/performed       Past Medical History:  Diagnosis Date  . Acute kidney injury ([place])   . Aortic stenosis, mild 11/17/2013  . Arthritis    "both knees, spine, back, fingers" (11/29/2013)  . CHF (congestive heart failure) ([place])   . Edema   . GERD (gastroesophageal reflux disease)   . Heart murmur   . History of diastolic dysfunction    Echo 123XX123 with diastolic dysfunction  . HTN (hypertension)   . Morbid obesity ([place])   . OA (osteoarthritis)   . PAC (premature atrial contraction)    per prior Holter  . Palpitations   . Pneumonia    "as a child"  . PVC's (premature ventricular contractions)    per prior Holter  . SVT (supraventricular tachycardia) ([place])   . Tachycardia    noted at 07/28/11 visit. Started on beta blocker. Possible atrial flutter vs long PR tachycardia/AVNRT  . Type II diabetes mellitus ([place])     Past Surgical History:  Procedure Laterality Date  . CESAREAN SECTION  1985  . CESAREAN SECTION  1985  . SUPRAVENTRICULAR TACHYCARDIA ABLATION  11/29/2013  . SUPRAVENTRICULAR TACHYCARDIA ABLATION N/A 11/29/2013   Procedure: SUPRAVENTRICULAR TACHYCARDIA ABLATION;  Surgeon: Evans Lance, MD;  Location: [facility] CATH LAB;  Service: Cardiovascular;  Laterality: N/A;  . US ECHOCARDIOGRAPHY  07/25/2008   EF 55-60%  . VAGINAL DELIVERY       There were no vitals filed for this visit.  Subjective Assessment - 06/13/19 1305    Subjective  Pt states that she has one more day of antibiotic and states that she did chair exercises on Friday which made her knees sore.    Pertinent History  osteo arthritis, hx cellulitis, phlebitis and history of edema in BLE, Hx Acute kidney injury, CHF and DM II    Patient Stated Goals  I want to get this swelling down and heal my wound.    Currently in Pain?  Yes    Pain Score  10-Worst pain ever    Pain Location  Knee    Pain Orientation  Right;Left    Pain Descriptors / Indicators  Aching    Pain Type  Chronic pain    Pain Onset  More than a month ago    Aggravating Factors   movement    Pain Relieving Factors  rest                       [facility] Adult PT Treatment/Exercise - 06/13/19 0001      Manual Therapy   Manual Therapy  Compression Bandaging;Edema management    Manual therapy comments  Pt was washed with warm water after her bandages were removed. ABD pad over the posterior R lower leg  and R lateral lower leg due to small amount of drainage, Allyven placed over the anterior R lower leg due to small irritated looking area. Continues with small amount of serous drainage at the R lower leg.     Edema Management  Tubigrip Size K folded at the R knee/thigh, single layer at the L knee/thigh.     Compression Bandaging  No toe wraps were used. Double tubigrip at the R thigh and single layer at the L and Bil lower legs. 2 artiflex for shaping with 3 short stretch bandages used from mid foot to below the knee.              PT Education - 06/13/19 1451    Education Details  Pt will continue with her pump and wearing her compression wraps. She will continue participating in chair exercises.    Person(s) Educated  Patient    Methods  Explanation    Comprehension  Verbalized understanding       PT Short Term Goals - 04/04/19 1515      PT SHORT TERM GOAL #1   Title  Pt  will be independent with HEP    Baseline  Pt is working on her HEP occasionally at home.    Time  5    Period  Weeks    Status  On-going        PT Long Term Goals - 04/04/19 1515      PT LONG TERM GOAL #1   Title  Patient will have reduction of L lower leg limb girth by 3 cm    Baseline  Pt will be measuremed next session she has significantly reduced from her initial evaluation.    Time  5    Status  On-going    Target Date  05/23/19      PT LONG TERM GOAL #2   Title  Patient to be properly fitted with compression garment to wear on daily basis.    Baseline  Pt has a juxtalite that is too small and old that ht evelcro is no longer usable on. Trying to get her a [employer].    Time  5    Period  Weeks    Status  On-going    Target Date  05/23/19      PT LONG TERM GOAL #3   Title  Patient will be independent in self-care management principles including self-massage/bandaging and long term management plan for edema.    Baseline  Pt has difficulty with managing her lymphedema. Trying to ger her a pump to help with swelling.    Time  5    Period  Weeks    Status  On-going    Target Date  05/23/19            Plan - 06/13/19 1305    Clinical Impression Statement  Pt continues with maintaining at the L lower leg with improvement in desquamation and overall skin integrity. The R lower leg is decreasing in size and has significant improvement in draining. Pt bil lower legs were washed and R lower leg was dressed with allevyn/ABD pads prior to compression wrap. BLE were wrapped from foot to below the knee with short stretch bandages with minimal bandaging on the feet continued. She continues with no significant increase in edema at the toes though they have not been wrapped. Continued to use Tubigrip at Bil thighs/knees for edema management. Pt will benefit from current POC at this time.  Personal Factors and Comorbidities  Age;Comorbidity 3+    Comorbidities  CHF, acute kidney  injury, DM II    Rehab Potential  Good    PT Frequency  2x / week    PT Duration  12 weeks    PT Treatment/Interventions  Electrical Stimulation;Cryotherapy;Gait training;Stair training;Functional mobility training;Therapeutic activities;Therapeutic exercise;Balance training;Neuromuscular re-education;Patient/family education;Manual lymph drainage;Compression bandaging;Passive range of motion;Taping;Vasopneumatic Device    PT Next Visit Plan  Continue with wrapping, help order garments if needed. Ask if she has spoke to Education officer, museum?    PT Home Exercise Plan  pump and exercises    Consulted and Agree with Plan of Care  Patient       Patient will benefit from skilled therapeutic intervention in order to improve the following deficits and impairments:  Difficulty walking, Increased edema  Visit Diagnosis: Lymphedema  Other abnormalities of gait and mobility     Problem List Patient Active Problem List   Diagnosis Date Noted  . Postnasal drip 11/16/2017  . Sensorineural hearing loss (SNHL), bilateral 11/16/2017  . Tinnitus of both ears 11/16/2017  . Obesity, Class II, BMI 35-39.9, isolated (see actual BMI) 11/08/2017  . CKD (chronic kidney disease), stage III 11/08/2017  . Anemia 11/07/2017  . Thrombocytosis ([place]) 11/07/2017  . Lymphedema 11/07/2017  . Routine general medical examination at a health care facility 05/10/2014  . Paroxysmal SVT (supraventricular tachycardia) ([place]) 11/28/2013  . Physical deconditioning 11/23/2013  . Generalized weakness 11/17/2013  . Nausea 11/17/2013  . Multilevel spine pain 11/17/2013  . Aortic stenosis, mild 11/17/2013  . DM type 2 (diabetes mellitus, type 2) ([place]) 02/02/2013  . Arthritis 10/19/2012  . Mixed incontinence 10/19/2012  . GERD (gastroesophageal reflux disease) 04/20/2011  . HTN (hypertension) 01/20/2011  . Morbid obesity [facility])     Ander Purpura, PT 06/13/2019, 2:59 PM  [place], [place], [address] Phone: [phone]   Fax:  [phone]  Name: Princella Craige MRN: [id number] Date of Birth: [birth year]

## 2019-06-15 ENCOUNTER — Ambulatory Visit: Payer: PPO

## 2019-06-15 ENCOUNTER — Other Ambulatory Visit: Payer: Self-pay

## 2019-06-15 DIAGNOSIS — I890 Lymphedema, not elsewhere classified: Secondary | ICD-10-CM

## 2019-06-15 DIAGNOSIS — R2689 Other abnormalities of gait and mobility: Secondary | ICD-10-CM

## 2019-06-15 NOTE — Therapy (Signed)
[place], [place], [address] Phone: [phone]   Fax:  [phone]  Physical Therapy Progress Note Progress Note Reporting Period 05/09/19 to 06/15/19  See note below for Objective Data and Assessment of Progress/Goals.       Patient Details  Name: [name] [name] MRN: [id number] Date of Birth: 1945-09-09 Referring Provider (PT): Darci Current   Encounter Date: 06/15/2019  PT End of Session - 06/15/19 1301    Visit Number  29    Number of Visits  34    Date for PT Re-Evaluation  07/13/19    PT Start Time  1300    PT Stop Time  1345    PT Time Calculation (min)  45 min    Activity Tolerance  Patient tolerated treatment well    Behavior During Therapy  [facility] for tasks assessed/performed       Past Medical History:  Diagnosis Date  . Acute kidney injury ([place])   . Aortic stenosis, mild 11/17/2013  . Arthritis    "both knees, spine, back, fingers" (11/29/2013)  . CHF (congestive heart failure) ([place])   . Edema   . GERD (gastroesophageal reflux disease)   . Heart murmur   . History of diastolic dysfunction    Echo 123XX123 with diastolic dysfunction  . HTN (hypertension)   . Morbid obesity ([place])   . OA (osteoarthritis)   . PAC (premature atrial contraction)    per prior Holter  . Palpitations   . Pneumonia    "as a child"  . PVC's (premature ventricular contractions)    per prior Holter  . SVT (supraventricular tachycardia) ([place])   . Tachycardia    noted at 07/28/11 visit. Started on beta blocker. Possible atrial flutter vs long PR tachycardia/AVNRT  . Type II diabetes mellitus ([place])     Past Surgical History:  Procedure Laterality Date  . CESAREAN SECTION  1985  . CESAREAN SECTION  1985  . SUPRAVENTRICULAR TACHYCARDIA ABLATION  11/29/2013  . SUPRAVENTRICULAR TACHYCARDIA ABLATION N/A 11/29/2013   Procedure: SUPRAVENTRICULAR TACHYCARDIA ABLATION;  Surgeon: Evans Lance, MD;  [facility]  CATH LAB;  Service: Cardiovascular;  Laterality: N/A;  . US ECHOCARDIOGRAPHY  07/25/2008   EF 55-60%  . VAGINAL DELIVERY      There were no vitals filed for this visit.  Subjective Assessment - 06/15/19 1302    Subjective  Pt states that she is still taking her antibiotic. She goes to her chair exercises again tomorrow. She has recieved her edema wear in the mail.    Pertinent History  osteo arthritis, hx cellulitis, phlebitis and history of edema in BLE, Hx Acute kidney injury, CHF and DM II    Patient Stated Goals  I want to get this swelling down and heal my wound.    Currently in Pain?  No/denies    Pain Score  0-No pain            LYMPHEDEMA/ONCOLOGY QUESTIONNAIRE - 06/15/19 1309      Right Lower Extremity Lymphedema   10 cm Proximal to Suprapatella  70 cm    At Midpatella/Popliteal Crease  58 cm    30 cm Proximal to Floor at Lateral Plantar Foot  57.5 cm    20 cm Proximal to Floor at Lateral Plantar Foot  48 1    10 cm Proximal to Floor at Lateral Malleoli  30.5 cm    5 cm Proximal to 1st MTP Joint  24.3 cm  Across MTP Joint  23.5 cm    Around Proximal Great Toe  9.8 cm      Left Lower Extremity Lymphedema   20 cm Proximal to Suprapatella  67.5 cm    10 cm Proximal to Suprapatella  69.5 cm    At Midpatella/Popliteal Crease  54 cm    30 cm Proximal to Floor at Lateral Plantar Foot  51 cm    20 cm Proximal to Floor at Lateral Plantar Foot  38 cm    10 cm Proximal to Floor at Lateral Malleoli  28.3 cm    5 cm Proximal to 1st MTP Joint  22 cm    Around Proximal Great Toe  10.6 cm                [facility] Adult PT Treatment/Exercise - 06/15/19 0001      Manual Therapy   Manual Therapy  Compression Bandaging;Edema management    Manual therapy comments  Pt was washed with warm water after her bandages were removed. ABD pad over the anterior R lower leg due to small blister formation, no draining evident this session. She was assisted with donning edema wear on  her LLE    Edema Management  pt was assisted with donning edema wear on the LLE and size K tubigrip then folded size K tubigrip on the R thigh     Compression Bandaging  No toe wraps were used. Double tubigrip at the R thigh and single layer at the L and double at the R lower leg. 2 artiflex for shaping with 4 short stretch bandages used from mid foot to below the knee.              PT Education - 06/15/19 1440    Education Details  Pt will continue with herpump and wearing her compression wrap on her RLE. She will wear the edema wrap on the L lower leg/thigh and the tubigrip at the L thigh above the edema wear    Person(s) Educated  Patient    Methods  Explanation    Comprehension  Verbalized understanding       PT Short Term Goals - 06/15/19 1445      PT SHORT TERM GOAL #1   Title  Pt will be independent with HEP    Baseline  Pt is working on her HEP occasionally at home.    Status  On-going    Target Date  07/13/19        PT Long Term Goals - 06/15/19 1445      PT LONG TERM GOAL #1   Title  Patient will have reduction of L lower leg limb girth by 3 cm    Baseline  Pt has reduced from her initial evaluation and has been maintaining well continue to measure    Time  3    Period  Weeks    Status  On-going    Target Date  07/13/19      PT LONG TERM GOAL #2   Title  Patient to be properly fitted with compression garment to wear on daily basis.    Baseline  Pt has received edema and will see if this is adequate amount of compression for the LE    Time  3    Period  Weeks    Status  On-going    Target Date  07/13/19      PT LONG TERM GOAL #3   Title  Patient will be independent  in self-care management principles including self-massage/bandaging and long term management plan for edema.    Baseline  Pt has started to pump more frequently but continues with difficulty managing her lymphedema.    Time  3    Period  Weeks    Status  On-going    Target Date  07/13/19             Plan - 06/15/19 1301    Clinical Impression Statement  Pt continues with maintaining at the L lower leg with circumferential measurements. Her skin continues to improve. She has received her compression garments and was assisted with donning today. The R lower leg continues to decrease in size and no longer is draining. She has 1 small less than 1 cm size blister on the anterior/lateral distal lower leg; covered with ABD pad this session. RLE was wrapped from foot to below the knee wtih short stretch bandages with minimal bandaging at the foot. She continues tih no significant increase in edema at the toes without wrapping. Pt continues to use tubigrip at Bil knees/thighs for edema management. Pt will benefit from continued skilled physical therapy services in order to finish her antibiotic for her RLE and to ensure adequate level of compression to prevent wound formation in Bil lower extremities for 5 more visits.    Personal Factors and Comorbidities  Age;Comorbidity 3+    Comorbidities  CHF, acute kidney injury, DM II    Rehab Potential  Good    PT Frequency  2x / week    PT Duration  3 weeks    PT Treatment/Interventions  Electrical Stimulation;Cryotherapy;Gait training;Stair training;Functional mobility training;Therapeutic activities;Therapeutic exercise;Balance training;Neuromuscular re-education;Patient/family education;Manual lymph drainage;Compression bandaging;Passive range of motion;Taping;Vasopneumatic Device    PT Next Visit Plan  Continue with wrapping, help order garments if needed. Ask if she has spoke to Education officer, museum?    PT Home Exercise Plan  pump and exercises    Consulted and Agree with Plan of Care  Patient       Patient will benefit from skilled therapeutic intervention in order to improve the following deficits and impairments:  Difficulty walking, Increased edema  Visit Diagnosis: Lymphedema  Other abnormalities of gait and mobility     Problem  List Patient Active Problem List   Diagnosis Date Noted  . Postnasal drip 11/16/2017  . Sensorineural hearing loss (SNHL), bilateral 11/16/2017  . Tinnitus of both ears 11/16/2017  . Obesity, Class II, BMI 35-39.9, isolated (see actual BMI) 11/08/2017  . CKD (chronic kidney disease), stage III 11/08/2017  . Anemia 11/07/2017  . Thrombocytosis ([place]) 11/07/2017  . Lymphedema 11/07/2017  . Routine general medical examination at a health care facility 05/10/2014  . Paroxysmal SVT (supraventricular tachycardia) ([place]) 11/28/2013  . Physical deconditioning 11/23/2013  . Generalized weakness 11/17/2013  . Nausea 11/17/2013  . Multilevel spine pain 11/17/2013  . Aortic stenosis, mild 11/17/2013  . DM type 2 (diabetes mellitus, type 2) ([place]) 02/02/2013  . Arthritis 10/19/2012  . Mixed incontinence 10/19/2012  . GERD (gastroesophageal reflux disease) 04/20/2011  . HTN (hypertension) 01/20/2011  . Morbid obesity [facility])     Ander Purpura, PT 06/15/2019, 2:48 PM  [place], [place], [address] Phone: [phone]   Fax:  [phone]  Name: [name] [name] MRN: [id number] Date of Birth: [birth year]

## 2019-06-20 ENCOUNTER — Other Ambulatory Visit: Payer: Self-pay

## 2019-06-20 ENCOUNTER — Ambulatory Visit: Payer: PPO

## 2019-06-20 DIAGNOSIS — R2689 Other abnormalities of gait and mobility: Secondary | ICD-10-CM

## 2019-06-20 DIAGNOSIS — I890 Lymphedema, not elsewhere classified: Secondary | ICD-10-CM | POA: Diagnosis not present

## 2019-06-20 NOTE — Therapy (Signed)
[place], [place], [address] Phone: [phone]   Fax:  [phone]  Physical Therapy Treatment  Patient Details  Name: [name] [name] MRN: [id number] Date of Birth: October 31, 1945 Referring Provider (PT): Darci Current   Encounter Date: 06/20/2019  PT End of Session - 06/20/19 1242    Visit Number  30    Number of Visits  34    Date for PT Re-Evaluation  07/13/19    PT Start Time  1200    PT Stop Time  1242    PT Time Calculation (min)  42 min    Activity Tolerance  Patient tolerated treatment well    Behavior During Therapy  [facility] for tasks assessed/performed       Past Medical History:  Diagnosis Date  . Acute kidney injury ([place])   . Aortic stenosis, mild 11/17/2013  . Arthritis    "both knees, spine, back, fingers" (11/29/2013)  . CHF (congestive heart failure) ([place])   . Edema   . GERD (gastroesophageal reflux disease)   . Heart murmur   . History of diastolic dysfunction    Echo [id number] with diastolic dysfunction  . HTN (hypertension)   . Morbid obesity ([place])   . OA (osteoarthritis)   . PAC (premature atrial contraction)    per prior Holter  . Palpitations   . Pneumonia    "as a child"  . PVC's (premature ventricular contractions)    per prior Holter  . SVT (supraventricular tachycardia) ([place])   . Tachycardia    noted at 07/28/11 visit. Started on beta blocker. Possible atrial flutter vs long PR tachycardia/AVNRT  . Type II diabetes mellitus ([place])     Past Surgical History:  Procedure Laterality Date  . CESAREAN SECTION  1985  . CESAREAN SECTION  1985  . SUPRAVENTRICULAR TACHYCARDIA ABLATION  11/29/2013  . SUPRAVENTRICULAR TACHYCARDIA ABLATION N/A 11/29/2013   Procedure: SUPRAVENTRICULAR TACHYCARDIA ABLATION;  Surgeon: Evans Lance, MD;  Location: [facility] CATH LAB;  Service: Cardiovascular;  Laterality: N/A;  . US ECHOCARDIOGRAPHY  07/25/2008   EF 55-60%  . VAGINAL DELIVERY       There were no vitals filed for this visit.  Subjective Assessment - 06/20/19 1243    Subjective  Pt states that she tried her pump with her stockings on and it rubbed her skin off. She took off her garment and hasn't worn it since Friday.    Pertinent History  osteo arthritis, hx cellulitis, phlebitis and history of edema in BLE, Hx Acute kidney injury, CHF and DM II    Patient Stated Goals  I want to get this swelling down and heal my wound.    Currently in Pain?  No/denies    Pain Score  0-No pain                       [facility] Adult PT Treatment/Exercise - 06/20/19 0001      Manual Therapy   Manual Therapy  Compression Bandaging;Edema management    Manual therapy comments  Pt LE were washed after removing bandages. Allevyn and ABD pads were placed over the L lateral/posterior L lower leg.     Compression Bandaging  No toe wraps were used. Double size K tubigrip at the L thigh, single layer tubigrip used at R leg and L lower leg. 4 short stretch bandages were used for wrapping with artiflex for shaping.  PT Education - 06/20/19 1247    Education Details  Pt will continue to pump wearing her compression wraps since this previously did not cause an issue. After 2 more weeks of wrapping she will wear the edema wear and remove for pumping.    Person(s) Educated  Patient    Methods  Explanation    Comprehension  Verbalized understanding       PT Short Term Goals - 06/15/19 1445      PT SHORT TERM GOAL #1   Title  Pt will be independent with HEP    Baseline  Pt is working on her HEP occasionally at home.    Status  On-going    Target Date  07/13/19        PT Long Term Goals - 06/15/19 1445      PT LONG TERM GOAL #1   Title  Patient will have reduction of L lower leg limb girth by 3 cm    Baseline  Pt has reduced from her initial evaluation and has been maintaining well continue to measure    Time  3    Period  Weeks    Status  On-going     Target Date  07/13/19      PT LONG TERM GOAL #2   Title  Patient to be properly fitted with compression garment to wear on daily basis.    Baseline  Pt has received edema and will see if this is adequate amount of compression for the LE    Time  3    Period  Weeks    Status  On-going    Target Date  07/13/19      PT LONG TERM GOAL #3   Title  Patient will be independent in self-care management principles including self-massage/bandaging and long term management plan for edema.    Baseline  Pt has started to pump more frequently but continues with difficulty managing her lymphedema.    Time  3    Period  Weeks    Status  On-going    Target Date  07/13/19            Plan - 06/20/19 1242    Clinical Impression Statement  Pt presents to physical therapy with no garment or wraps on her L lower leg. Desquamated skin had been removed with surfaces area non draining open areas most likely due to pt wearing edema wear while pumping. Her L lateral/posterior lower leg was dressed and wrapped with compression wrap this session to prevent progression of any open areas. No signs/symptoms of infection and pt is still on antibiotic from her last infection. Pt bil lower legs were washed and then layered compression wrap used. Discussed removing edema wear when pumping but making sure there is a cloth barrier such as pants when she does pump. Pt will benefit from continued POC at this time.    Personal Factors and Comorbidities  Age;Comorbidity 3+    Comorbidities  CHF, acute kidney injury, DM II    Rehab Potential  Good    PT Frequency  2x / week    PT Duration  3 weeks    PT Treatment/Interventions  Electrical Stimulation;Cryotherapy;Gait training;Stair training;Functional mobility training;Therapeutic activities;Therapeutic exercise;Balance training;Neuromuscular re-education;Patient/family education;Manual lymph drainage;Compression bandaging;Passive range of motion;Taping;Vasopneumatic Device     PT Next Visit Plan  Continue with wrapping, help order garments if needed. Ask if she has spoke to Education officer, museum?    PT Home Exercise Plan  pump and  exercises    Consulted and Agree with Plan of Care  Patient       Patient will benefit from skilled therapeutic intervention in order to improve the following deficits and impairments:  Difficulty walking, Increased edema  Visit Diagnosis: Lymphedema  Other abnormalities of gait and mobility     Problem List Patient Active Problem List   Diagnosis Date Noted  . Postnasal drip 11/16/2017  . Sensorineural hearing loss (SNHL), bilateral 11/16/2017  . Tinnitus of both ears 11/16/2017  . Obesity, Class II, BMI 35-39.9, isolated (see actual BMI) 11/08/2017  . CKD (chronic kidney disease), stage III 11/08/2017  . Anemia 11/07/2017  . Thrombocytosis ([place]) 11/07/2017  . Lymphedema 11/07/2017  . Routine general medical examination at a health care facility 05/10/2014  . Paroxysmal SVT (supraventricular tachycardia) ([place]) 11/28/2013  . Physical deconditioning 11/23/2013  . Generalized weakness 11/17/2013  . Nausea 11/17/2013  . Multilevel spine pain 11/17/2013  . Aortic stenosis, mild 11/17/2013  . DM type 2 (diabetes mellitus, type 2) ([place]) 02/02/2013  . Arthritis 10/19/2012  . Mixed incontinence 10/19/2012  . GERD (gastroesophageal reflux disease) 04/20/2011  . HTN (hypertension) 01/20/2011  . Morbid obesity ([place])     Ander Purpura, PT 06/20/2019, 12:50 PM  [place], [place], [address] Phone: [phone]   Fax:  [phone]  Name: [name] [name] MRN: [id number] Date of Birth: 1945/07/11

## 2019-06-21 ENCOUNTER — Ambulatory Visit: Payer: PPO

## 2019-06-22 ENCOUNTER — Ambulatory Visit: Payer: PPO

## 2019-06-22 ENCOUNTER — Other Ambulatory Visit: Payer: Self-pay

## 2019-06-22 DIAGNOSIS — I890 Lymphedema, not elsewhere classified: Secondary | ICD-10-CM

## 2019-06-22 DIAGNOSIS — R2689 Other abnormalities of gait and mobility: Secondary | ICD-10-CM

## 2019-06-22 NOTE — Therapy (Signed)
[place], [place], [address] Phone: [phone]   Fax:  [phone]  Physical Therapy Treatment  Patient Details  Name: [name] [name] MRN: [id number] Date of Birth: [birth year] Referring Provider (PT): Darci Current   Encounter Date: 06/22/2019  PT End of Session - 06/22/19 1425    Visit Number  31    Number of Visits  34    Date for PT Re-Evaluation  07/13/19    PT Start Time  [id number]    PT Stop Time  [id number]    PT Time Calculation (min)  53 min    Activity Tolerance  Patient tolerated treatment well    Behavior During Therapy  [facility] for tasks assessed/performed       Past Medical History:  Diagnosis Date  . Acute kidney injury ([place])   . Aortic stenosis, mild 11/17/2013  . Arthritis    "both knees, spine, back, fingers" (11/29/2013)  . CHF (congestive heart failure) ([place])   . Edema   . GERD (gastroesophageal reflux disease)   . Heart murmur   . History of diastolic dysfunction    Echo [id number] with diastolic dysfunction  . HTN (hypertension)   . Morbid obesity ([place])   . OA (osteoarthritis)   . PAC (premature atrial contraction)    per prior Holter  . Palpitations   . Pneumonia    "as a child"  . PVC's (premature ventricular contractions)    per prior Holter  . SVT (supraventricular tachycardia) ([place])   . Tachycardia    noted at 07/28/11 visit. Started on beta blocker. Possible atrial flutter vs long PR tachycardia/AVNRT  . Type II diabetes mellitus ([place])     Past Surgical History:  Procedure Laterality Date  . CESAREAN SECTION  1985  . CESAREAN SECTION  1985  . SUPRAVENTRICULAR TACHYCARDIA ABLATION  11/29/2013  . SUPRAVENTRICULAR TACHYCARDIA ABLATION N/A 11/29/2013   Procedure: SUPRAVENTRICULAR TACHYCARDIA ABLATION;  Surgeon: Evans Lance, MD;  Location: [facility] CATH LAB;  Service: Cardiovascular;  Laterality: N/A;  . US ECHOCARDIOGRAPHY  07/25/2008   EF 55-60%  . VAGINAL DELIVERY       There were no vitals filed for this visit.  Subjective Assessment - 06/22/19 1426    Pertinent History  osteo arthritis, hx cellulitis, phlebitis and history of edema in BLE, Hx Acute kidney injury, CHF and DM II    Patient Stated Goals  I want to get this swelling down and heal my wound.    Currently in Pain?  Yes    Pain Score  1     Pain Location  Knee    Pain Orientation  Right;Left    Pain Descriptors / Indicators  Aching    Pain Type  Chronic pain    Pain Onset  More than a month ago    Pain Frequency  Intermittent    Aggravating Factors   movement    Pain Relieving Factors  rest                       [facility] Adult PT Treatment/Exercise - 06/22/19 0001      Manual Therapy   Manual Therapy  Compression Bandaging;Edema management    Manual therapy comments  Pt LE were washed after removing bandages. Allevyn and ABD pads were placed over the L lateral/posterior L lower leg.     Compression Bandaging  No toe wraps were used. Double size K tubigrip at the R thigh, single layer  tubigrip used at Bil lower legs leg and L thigh.  4 short stretch bandages were used for wrapping with artiflex for shaping.              PT Education - 06/22/19 1448    Education Details  Pt will continueto pump wearing her compression wraps since this previously did not cause an issue.    Person(s) Educated  Patient    Methods  Explanation    Comprehension  Verbalized understanding       PT Short Term Goals - 06/15/19 1445      PT SHORT TERM GOAL #1   Title  Pt will be independent with HEP    Baseline  Pt is working on her HEP occasionally at home.    Status  On-going    Target Date  07/13/19        PT Long Term Goals - 06/15/19 1445      PT LONG TERM GOAL #1   Title  Patient will have reduction of L lower leg limb girth by 3 cm    Baseline  Pt has reduced from her initial evaluation and has been maintaining well continue to measure    Time  3    Period  Weeks     Status  On-going    Target Date  07/13/19      PT LONG TERM GOAL #2   Title  Patient to be properly fitted with compression garment to wear on daily basis.    Baseline  Pt has received edema and will see if this is adequate amount of compression for the LE    Time  3    Period  Weeks    Status  On-going    Target Date  07/13/19      PT LONG TERM GOAL #3   Title  Patient will be independent in self-care management principles including self-massage/bandaging and long term management plan for edema.    Baseline  Pt has started to pump more frequently but continues with difficulty managing her lymphedema.    Time  3    Period  Weeks    Status  On-going    Target Date  07/13/19            Plan - 06/22/19 1425    Clinical Impression Statement  Pt presents to physical therapy with her Bil lower legs wrapped. No odor present, Serous drainage from the posterior/lateral L lower leg area that had desquamated skin removed from using her pump wtih the garment. Skin is healing well with no signs or symptoms of infection. Her L lateral/posterior lowe rleg was dress and wrapped with compression wrap this session. Bil lower legs were washed/skin care and then layered compression wrap was applied. Pt will continue to use her pump at home and has done that since her last visit with no further irritation. Pt will benefit from continued POC at this time.    Personal Factors and Comorbidities  Age;Comorbidity 3+    Comorbidities  CHF, acute kidney injury, DM II    Rehab Potential  Good    PT Frequency  2x / week    PT Duration  3 weeks    PT Treatment/Interventions  Electrical Stimulation;Cryotherapy;Gait training;Stair training;Functional mobility training;Therapeutic activities;Therapeutic exercise;Balance training;Neuromuscular re-education;Patient/family education;Manual lymph drainage;Compression bandaging;Passive range of motion;Taping;Vasopneumatic Device    PT Next Visit Plan  Continue with  wrapping, help order garments if needed. Ask if she has spoke to Education officer, museum?  PT Home Exercise Plan  pump and exercises    Consulted and Agree with Plan of Care  Patient       Patient will benefit from skilled therapeutic intervention in order to improve the following deficits and impairments:  Difficulty walking, Increased edema  Visit Diagnosis: Lymphedema  Other abnormalities of gait and mobility     Problem List Patient Active Problem List   Diagnosis Date Noted  . Postnasal drip 11/16/2017  . Sensorineural hearing loss (SNHL), bilateral 11/16/2017  . Tinnitus of both ears 11/16/2017  . Obesity, Class II, BMI 35-39.9, isolated (see actual BMI) 11/08/2017  . CKD (chronic kidney disease), stage III 11/08/2017  . Anemia 11/07/2017  . Thrombocytosis ([place]) 11/07/2017  . Lymphedema 11/07/2017  . Routine general medical examination at a health care facility 05/10/2014  . Paroxysmal SVT (supraventricular tachycardia) ([place]) 11/28/2013  . Physical deconditioning 11/23/2013  . Generalized weakness 11/17/2013  . Nausea 11/17/2013  . Multilevel spine pain 11/17/2013  . Aortic stenosis, mild 11/17/2013  . DM type 2 (diabetes mellitus, type 2) ([place]) 02/02/2013  . Arthritis 10/19/2012  . Mixed incontinence 10/19/2012  . GERD (gastroesophageal reflux disease) 04/20/2011  . HTN (hypertension) 01/20/2011  . Morbid obesity [facility])     Ander Purpura, PT 06/22/2019, 2:51 PM  [place], [place], [address] Phone: [phone]   Fax:  [phone]  Name: [name] [name] MRN: [id number] Date of Birth: February 25, 1946

## 2019-06-26 ENCOUNTER — Telehealth: Payer: Self-pay | Admitting: Internal Medicine

## 2019-06-26 NOTE — Telephone Encounter (Signed)
Patient would like to know if she can get the COVID vaccine. She has multiple health conditions and she is concerned. She also is on an abx and was unsure if she could get it while on it.

## 2019-06-27 NOTE — Telephone Encounter (Signed)
  Patient following up on covid vaccine advice . Covid vaccine scheduled for today 11:45

## 2019-06-29 ENCOUNTER — Ambulatory Visit: Payer: Self-pay | Attending: Internal Medicine

## 2019-06-29 ENCOUNTER — Ambulatory Visit: Payer: PPO

## 2019-06-29 ENCOUNTER — Ambulatory Visit: Payer: Self-pay

## 2019-06-29 ENCOUNTER — Other Ambulatory Visit: Payer: Self-pay

## 2019-06-29 DIAGNOSIS — I890 Lymphedema, not elsewhere classified: Secondary | ICD-10-CM

## 2019-06-29 DIAGNOSIS — R2689 Other abnormalities of gait and mobility: Secondary | ICD-10-CM

## 2019-06-29 NOTE — Telephone Encounter (Signed)
Pt has already had her vaccine. Pt would have liked to have been called before she received the vaccine.   Pt stated that the clinic that administered was okay with her taking an abx and getting the vaccine.

## 2019-06-29 NOTE — Therapy (Signed)
[place], [place], [address] Phone: [phone]   Fax:  [phone]  Physical Therapy Treatment  Patient Details  Name: [name] [name] MRN: [id number] Date of Birth: 26-Apr-1945 Referring Provider (PT): Darci Current   Encounter Date: 06/29/2019  PT End of Session - 06/29/19 1447    Visit Number  32    Number of Visits  34    Date for PT Re-Evaluation  07/13/19    PT Start Time  1304    PT Stop Time  1358    PT Time Calculation (min)  54 min    Activity Tolerance  Patient tolerated treatment well    Behavior During Therapy  [facility] for tasks assessed/performed       Past Medical History:  Diagnosis Date  . Acute kidney injury ([place])   . Aortic stenosis, mild 11/17/2013  . Arthritis    "both knees, spine, back, fingers" (11/29/2013)  . CHF (congestive heart failure) ([place])   . Edema   . GERD (gastroesophageal reflux disease)   . Heart murmur   . History of diastolic dysfunction    Echo [id number] with diastolic dysfunction  . HTN (hypertension)   . Morbid obesity ([place])   . OA (osteoarthritis)   . PAC (premature atrial contraction)    per prior Holter  . Palpitations   . Pneumonia    "as a child"  . PVC's (premature ventricular contractions)    per prior Holter  . SVT (supraventricular tachycardia) ([place])   . Tachycardia    noted at 07/28/11 visit. Started on beta blocker. Possible atrial flutter vs long PR tachycardia/AVNRT  . Type II diabetes mellitus ([place])     Past Surgical History:  Procedure Laterality Date  . CESAREAN SECTION  1985  . CESAREAN SECTION  1985  . SUPRAVENTRICULAR TACHYCARDIA ABLATION  11/29/2013  . SUPRAVENTRICULAR TACHYCARDIA ABLATION N/A 11/29/2013   Procedure: SUPRAVENTRICULAR TACHYCARDIA ABLATION;  Surgeon: Evans Lance, MD;  Location: [facility] CATH LAB;  Service: Cardiovascular;  Laterality: N/A;  . US ECHOCARDIOGRAPHY  07/25/2008   EF 55-60%  . VAGINAL DELIVERY       There were no vitals filed for this visit.  Subjective Assessment - 06/29/19 1444    Subjective  Pt states that she got her first covid shot on Tuesday so had to cancel her appointment. Overall she has been doing well and continues to use her pump at home.    Pertinent History  osteo arthritis, hx cellulitis, phlebitis and history of edema in BLE, Hx Acute kidney injury, CHF and DM II    Patient Stated Goals  I want to get this swelling down and heal my wound.    Currently in Pain?  Yes    Pain Score  1                        [facility] Adult PT Treatment/Exercise - 06/29/19 0001      Manual Therapy   Manual Therapy  Compression Bandaging;Edema management    Manual therapy comments  Pt LE were washed after removing bandages. Allevyn and ABD pads were placed over the L lateral lower leg mid way, anterior L leg and proximal/lateral lower leg     Compression Bandaging  No toe wraps were used. Double size K tubigrip at the Bil thighs, single layer tubigrip used at Bil lower legs.  3 short stretch bandages were used for wrapping with artiflex for shaping.  PT Education - 06/29/19 1447    Education Details  Pt will continue to pump wearing her compression wraps. She will continue to exercise as she is able.    Person(s) Educated  Patient    Methods  Explanation    Comprehension  Verbalized understanding       PT Short Term Goals - 06/15/19 1445      PT SHORT TERM GOAL #1   Title  Pt will be independent with HEP    Baseline  Pt is working on her HEP occasionally at home.    Status  On-going    Target Date  07/13/19        PT Long Term Goals - 06/15/19 1445      PT LONG TERM GOAL #1   Title  Patient will have reduction of L lower leg limb girth by 3 cm    Baseline  Pt has reduced from her initial evaluation and has been maintaining well continue to measure    Time  3    Period  Weeks    Status  On-going    Target Date  07/13/19      PT LONG  TERM GOAL #2   Title  Patient to be properly fitted with compression garment to wear on daily basis.    Baseline  Pt has received edema and will see if this is adequate amount of compression for the LE    Time  3    Period  Weeks    Status  On-going    Target Date  07/13/19      PT LONG TERM GOAL #3   Title  Patient will be independent in self-care management principles including self-massage/bandaging and long term management plan for edema.    Baseline  Pt has started to pump more frequently but continues with difficulty managing her lymphedema.    Time  3    Period  Weeks    Status  On-going    Target Date  07/13/19            Plan - 06/29/19 1448    Clinical Impression Statement  Pt presents to physical thearpy with her Bil lower legs wrapped. No odor present. serous drainage from the lateral L mid lower leg and sanguinous drainage at the proximal lateral L lower leg where pt had scratched. Pt L lower leg has healed considerably since the desquamated skin got removed and she only has 2 small areas there were still covered with Allevyn dressing along with scratched area. No signs or symptoms of infection present. Bil lower legs were washed/skin care and then layered compression wrap was applied. Pt will continue to use her pump at home and has done that since her last visit with no further irritation. Pt will benefit from continued POC at this time.    Personal Factors and Comorbidities  Age;Comorbidity 3+    Comorbidities  CHF, acute kidney injury, DM II    Rehab Potential  Good    PT Frequency  2x / week    PT Duration  3 weeks    PT Treatment/Interventions  Electrical Stimulation;Cryotherapy;Gait training;Stair training;Functional mobility training;Therapeutic activities;Therapeutic exercise;Balance training;Neuromuscular re-education;Patient/family education;Manual lymph drainage;Compression bandaging;Passive range of motion;Taping;Vasopneumatic Device    PT Next Visit Plan   Continue with wrapping, help order garments if needed. Ask if she has spoke to Education officer, museum?    PT Home Exercise Plan  pump and exercises    Consulted and Agree with Plan of Care  Patient       Patient will benefit from skilled therapeutic intervention in order to improve the following deficits and impairments:  Difficulty walking, Increased edema  Visit Diagnosis: Lymphedema  Other abnormalities of gait and mobility     Problem List Patient Active Problem List   Diagnosis Date Noted  . Postnasal drip 11/16/2017  . Sensorineural hearing loss (SNHL), bilateral 11/16/2017  . Tinnitus of both ears 11/16/2017  . Obesity, Class II, BMI 35-39.9, isolated (see actual BMI) 11/08/2017  . CKD (chronic kidney disease), stage III 11/08/2017  . Anemia 11/07/2017  . Thrombocytosis ([place]) 11/07/2017  . Lymphedema 11/07/2017  . Routine general medical examination at a health care facility 05/10/2014  . Paroxysmal SVT (supraventricular tachycardia) ([place]) 11/28/2013  . Physical deconditioning 11/23/2013  . Generalized weakness 11/17/2013  . Nausea 11/17/2013  . Multilevel spine pain 11/17/2013  . Aortic stenosis, mild 11/17/2013  . DM type 2 (diabetes mellitus, type 2) ([place]) 02/02/2013  . Arthritis 10/19/2012  . Mixed incontinence 10/19/2012  . GERD (gastroesophageal reflux disease) 04/20/2011  . HTN (hypertension) 01/20/2011  . Morbid obesity ([place])     Ander Purpura, PT 06/29/2019, 2:50 PM  [place], [place], [address] Phone: [phone]   Fax:  [phone]  Name: [name] [name] MRN: [id number] Date of Birth: [birth year]

## 2019-06-29 NOTE — Telephone Encounter (Signed)
F/u   The patient voiced she did not hear back from the office on the COVID vaccine.   The patient went ahead and receive the COVID vaccine on Tuesday 3/23 and has an additional question, asking for a callback.

## 2019-06-30 NOTE — Addendum Note (Signed)
Addended by: Karle Barr on: 06/30/2019 03:45 PM   Modules accepted: Orders

## 2019-07-03 NOTE — Telephone Encounter (Signed)
Please just remember to send all messages which need a response to providers in the office while I am out of office as patients deserve a timely response.

## 2019-07-04 ENCOUNTER — Other Ambulatory Visit: Payer: Self-pay

## 2019-07-04 ENCOUNTER — Ambulatory Visit: Payer: PPO

## 2019-07-04 DIAGNOSIS — R2689 Other abnormalities of gait and mobility: Secondary | ICD-10-CM

## 2019-07-04 DIAGNOSIS — I890 Lymphedema, not elsewhere classified: Secondary | ICD-10-CM | POA: Diagnosis not present

## 2019-07-04 NOTE — Therapy (Signed)
[place], [place], [address] Phone: [phone]   Fax:  [phone]  Physical Therapy Treatment  Patient Details  Name: [name] [name] MRN: [id number] Date of Birth: [birth year] Referring Provider (PT): Darci Current   Encounter Date: 07/04/2019  PT End of Session - 07/04/19 1310    Visit Number  33    Number of Visits  34    Date for PT Re-Evaluation  07/13/19    PT Start Time  [id number]    PT Stop Time  1351    PT Time Calculation (min)  43 min    Activity Tolerance  Patient tolerated treatment well    Behavior During Therapy  [facility] for tasks assessed/performed       Past Medical History:  Diagnosis Date  . Acute kidney injury ([place])   . Aortic stenosis, mild 11/17/2013  . Arthritis    "both knees, spine, back, fingers" (11/29/2013)  . CHF (congestive heart failure) ([place])   . Edema   . GERD (gastroesophageal reflux disease)   . Heart murmur   . History of diastolic dysfunction    Echo [id number] with diastolic dysfunction  . HTN (hypertension)   . Morbid obesity ([place])   . OA (osteoarthritis)   . PAC (premature atrial contraction)    per prior Holter  . Palpitations   . Pneumonia    "as a child"  . PVC's (premature ventricular contractions)    per prior Holter  . SVT (supraventricular tachycardia) ([place])   . Tachycardia    noted at 07/28/11 visit. Started on beta blocker. Possible atrial flutter vs long PR tachycardia/AVNRT  . Type II diabetes mellitus ([place])     Past Surgical History:  Procedure Laterality Date  . CESAREAN SECTION  1985  . CESAREAN SECTION  1985  . SUPRAVENTRICULAR TACHYCARDIA ABLATION  11/29/2013  . SUPRAVENTRICULAR TACHYCARDIA ABLATION N/A 11/29/2013   Procedure: SUPRAVENTRICULAR TACHYCARDIA ABLATION;  Surgeon: Evans Lance, MD;  Location: [facility] CATH LAB;  Service: Cardiovascular;  Laterality: N/A;  . US ECHOCARDIOGRAPHY  07/25/2008   EF 55-60%  . VAGINAL DELIVERY       There were no vitals filed for this visit.  Subjective Assessment - 07/04/19 1311    Subjective  Pt states that she went bowling. She states that she has been pumping 1x/day.    Pertinent History  osteo arthritis, hx cellulitis, phlebitis and history of edema in BLE, Hx Acute kidney injury, CHF and DM II    Patient Stated Goals  I want to get this swelling down and heal my wound.    Currently in Pain?  No/denies                       [facility] Adult PT Treatment/Exercise - 07/04/19 0001      Manual Therapy   Manual Therapy  Compression Bandaging;Edema management    Manual therapy comments  Pt LE were washed after removing wraps. Small area still noted at the L anterior lower leg. Allevyn placed over this area. No signs/symptoms of infection noted including odor/drainage/rubor or heat.     Compression Bandaging  No toe wraps, Double tubigrip used at Bil lower legs and single layer at BIl thighs, 2 artiflex used then 3 short stretch bandages from foot to below the knee after cleansing legs.              PT Education - 07/04/19 1354    Education Details  Pt  will continue pumping at home and wearing her compression wraps. She is calling Education officer, museum today to check up since this physical therapist has called and left message but has not received a return phone call.    Person(s) Educated  Patient    Methods  Explanation    Comprehension  Verbalized understanding       PT Short Term Goals - 06/15/19 1445      PT SHORT TERM GOAL #1   Title  Pt will be independent with HEP    Baseline  Pt is working on her HEP occasionally at home.    Status  On-going    Target Date  07/13/19        PT Long Term Goals - 06/15/19 1445      PT LONG TERM GOAL #1   Title  Patient will have reduction of L lower leg limb girth by 3 cm    Baseline  Pt has reduced from her initial evaluation and has been maintaining well continue to measure    Time  3    Period  Weeks    Status   On-going    Target Date  07/13/19      PT LONG TERM GOAL #2   Title  Patient to be properly fitted with compression garment to wear on daily basis.    Baseline  Pt has received edema and will see if this is adequate amount of compression for the LE    Time  3    Period  Weeks    Status  On-going    Target Date  07/13/19      PT LONG TERM GOAL #3   Title  Patient will be independent in self-care management principles including self-massage/bandaging and long term management plan for edema.    Baseline  Pt has started to pump more frequently but continues with difficulty managing her lymphedema.    Time  3    Period  Weeks    Status  On-going    Target Date  07/13/19            Plan - 07/04/19 1310    Clinical Impression Statement  Pt presents with continued improvement in the skin on the L lower leg. One small open area continues on the anterior distal lower L leg; covered with allevyn after skin care. Small amount of serous drainage noted from open area less than a quarter in diameter. No signs or symptoms of infection present. Bil lower legs were washed/skin care and tehn layered compression wrap was applied. Pt will contact the social worker due to this physical therapist and the patient have not been able to reach her in order to get adequate garments in order to control swelling to decrease risk of venous wounds once pt is discharged from physical therapy due to swelling. Pt will benefit from continued POC at this time.    Personal Factors and Comorbidities  Age;Comorbidity 3+    Comorbidities  CHF, acute kidney injury, DM II    Rehab Potential  Good    PT Frequency  2x / week    PT Duration  3 weeks    PT Treatment/Interventions  Electrical Stimulation;Cryotherapy;Gait training;Stair training;Functional mobility training;Therapeutic activities;Therapeutic exercise;Balance training;Neuromuscular re-education;Patient/family education;Manual lymph drainage;Compression  bandaging;Passive range of motion;Taping;Vasopneumatic Device    PT Next Visit Plan  Continue with wrapping, help order garments if needed. Ask if she has spoke to Education officer, museum?    PT Home Exercise Plan  pump  and exercises    Consulted and Agree with Plan of Care  Patient       Patient will benefit from skilled therapeutic intervention in order to improve the following deficits and impairments:  Difficulty walking, Increased edema  Visit Diagnosis: Lymphedema  Other abnormalities of gait and mobility     Problem List Patient Active Problem List   Diagnosis Date Noted  . Postnasal drip 11/16/2017  . Sensorineural hearing loss (SNHL), bilateral 11/16/2017  . Tinnitus of both ears 11/16/2017  . Obesity, Class II, BMI 35-39.9, isolated (see actual BMI) 11/08/2017  . CKD (chronic kidney disease), stage III 11/08/2017  . Anemia 11/07/2017  . Thrombocytosis ([place]) 11/07/2017  . Lymphedema 11/07/2017  . Routine general medical examination at a health care facility 05/10/2014  . Paroxysmal SVT (supraventricular tachycardia) ([place]) 11/28/2013  . Physical deconditioning 11/23/2013  . Generalized weakness 11/17/2013  . Nausea 11/17/2013  . Multilevel spine pain 11/17/2013  . Aortic stenosis, mild 11/17/2013  . DM type 2 (diabetes mellitus, type 2) ([place]) 02/02/2013  . Arthritis 10/19/2012  . Mixed incontinence 10/19/2012  . GERD (gastroesophageal reflux disease) 04/20/2011  . HTN (hypertension) 01/20/2011  . Morbid obesity ([place])     Ander Purpura , PT 07/04/2019, 1:58 PM  [place], [place], [address] Phone: [phone]   Fax:  [phone]  Name: [name] [name] MRN: [id number] Date of Birth: [birth year]

## 2019-07-06 ENCOUNTER — Ambulatory Visit: Payer: PPO | Attending: Internal Medicine

## 2019-07-06 ENCOUNTER — Telehealth: Payer: PPO

## 2019-07-06 ENCOUNTER — Telehealth: Payer: Self-pay | Admitting: Pharmacist

## 2019-07-06 ENCOUNTER — Other Ambulatory Visit: Payer: Self-pay

## 2019-07-06 DIAGNOSIS — R2689 Other abnormalities of gait and mobility: Secondary | ICD-10-CM | POA: Insufficient documentation

## 2019-07-06 DIAGNOSIS — I890 Lymphedema, not elsewhere classified: Secondary | ICD-10-CM | POA: Insufficient documentation

## 2019-07-06 NOTE — Chronic Care Management (AMB) (Deleted)
Chronic Care Management   Outreach Note  07/06/2019 Name: [name] [name] MRN: [id number] DOB: 02/21/1946  Referred by: Hoyt Koch, MD Reason for referral : No chief complaint on file.   An unsuccessful telephone outreach was attempted today. The patient was referred to the pharmacist for assistance with care management and care coordination.        Chart prep for initial appointment below:   Chief Complaint/ HPI  [name] [name],  74 y.o. , female presents for their Initial CCM visit with the clinical pharmacist via telephone due to COVID-19 Pandemic.  PCP : Hoyt Koch, MD  Their chronic conditions include: HTN, T2DM, GERD, paroxysmal SVT, CKD3, arthritis, hearing loss  Office Visits: 06/06/19 Dr [name] VV: lymphedema with infection, rx'd doxycyline x 2 wks.   03/20/19 Dr [name] OV: currently not on ARB due to recent AKI. No med changes  01/12/19 Dr [name] OV: hospital f/u AKI d/t UTI. Push fluids and hold BP meds until eating/drinking normally.  Consult Visit: Lymphedema clinic ~twice a week for PT. 05/18/19 Dr Dellia Nims (wound care): once a week visits.  01/06/19-01/09/19 Hospital admission: AKI in clinic, sent to ER. Improved with antibiotics. Hold metformin, losartan, lasix until kidneys normalize.  Medications: Outpatient Encounter Medications as of 07/06/2019  Medication Sig  . acetaminophen (TYLENOL) 325 MG tablet Take 2 tablets (650 mg total) by mouth every 6 (six) hours as needed for mild pain (or Fever >/= 101).  Marland Kitchen aspirin EC 81 MG tablet Take 81 mg by mouth daily with breakfast. Takes once or twice weekly  . doxycycline (VIBRA-TABS) 100 MG tablet Take 1 tablet (100 mg total) by mouth 2 (two) times daily.  . furosemide (LASIX) 40 MG tablet TAKE 1 TABLET BY MOUTH EVERY DAY  . losartan (COZAAR) 25 MG tablet TAKE 4 TABLETS (100 MG TOTAL) BY MOUTH DAILY.  . metFORMIN (GLUCOPHAGE) 500 MG tablet Take 1 tablet (500 mg total) by mouth daily with  breakfast.  . methocarbamol (ROBAXIN) 500 MG tablet Take 1 tablet (500 mg total) by mouth every 6 (six) hours as needed for muscle spasms.  . metoprolol tartrate (LOPRESSOR) 50 MG tablet TAKE 1 TABLET BY MOUTH TWICE A DAY  . Neomycin-Bacitracin-Polymyxin ([facility] ANTIBIOTIC) [phone] OINT APPLY AFTER CLEANING BEFORE WRAPPING LGS (Patient taking differently: Apply 1 application topically daily. Apply after cleaning before wrapping lgs)  . ondansetron (ZOFRAN) 4 MG tablet Take 1 tablet (4 mg total) by mouth every 8 (eight) hours as needed for nausea or vomiting.  . pantoprazole (PROTONIX) 40 MG tablet TAKE 1 TABLET BY MOUTH EVERY DAY  . polyethylene glycol (MIRALAX / GLYCOLAX) packet Take 17 g by mouth daily.   No facility-administered encounter medications on file as of 07/06/2019.     Current Diagnosis/Assessment:  Goals Addressed   None     Diabetes   Recent Relevant Labs: Lab Results  Component Value Date/Time   HGBA1C 5.8 (H) 01/07/2019 04:38 AM   HGBA1C 7.1 (H) 10/11/2017 08:27 AM    Checking BG: {CHL HP Blood Glucose Monitoring Frequency:[phone]}  Recent FBG Readings: Recent pre-meal BG readings: *** Recent 2hr PP BG readings:  *** Recent HS BG readings: ***  Patient has failed these meds in past: Novolog Patient is currently {CHL Controlled/Uncontrolled:[phone]} on the following medications: metformin 500 mg qAM  Last diabetic eye exam:  Lab Results  Component Value Date/Time   HMDIABEYEEXA No Retinopathy 07/19/2018 12:00 AM    Last diabetic foot exam: No results found for: HMDIABFOOTEX  We discussed: {CHL HP Upstream Pharmacy discussion:[phone]}  Plan  Continue {CHL HP Upstream Pharmacy [phone]}    Hypertension   BP today is:  {CHL HP UPSTREAM Pharmacist BP [phone]}  Office blood pressures are  BP Readings from Last 3 Encounters:  03/20/19 140/80  01/12/19 130/70  01/09/19 (!) 101/52   Lab Results  Component Value Date    CREATININE 0.97 03/20/2019   CREATININE 1.14 01/12/2019   CREATININE 1.48 (H) 01/09/2019    Patient has failed these meds in the past: amlodipine, HCTZ, lisinopril Patient is currently {CHL Controlled/Uncontrolled:[phone]} on the following medications: losartan 100 mg (25 mg x 4) daily, metoprolol tartrate 50 mg BID, furosemide 40 mg daily  Patient checks BP at home {CHL HP BP Monitoring Frequency:[phone]}  Patient home BP readings are ranging: ***  We discussed {CHL HP Upstream Pharmacy discussion:[phone]}  Plan  Continue {CHL HP Upstream Pharmacy [phone]}    Hyperlipidemia   Lipid Panel     Component Value Date/Time   CHOL 178 03/20/2019 1446   TRIG 58.0 03/20/2019 1446   HDL 74.50 03/20/2019 1446   CHOLHDL 2 03/20/2019 1446   VLDL 11.6 03/20/2019 1446   [place] 92 03/20/2019 1446     The 10-year ASCVD risk score Mikey Bussing DC Jr., et al., 2013) is: 31.8%   Values used to calculate the score:     Age: 55 years     Sex: Female     Is Non-Hispanic African American: Yes     Diabetic: Yes     Tobacco smoker: No     Systolic Blood Pressure: [id number] mmHg     Is BP treated: Yes     HDL Cholesterol: 74.5 mg/dL     Total Cholesterol: 178 mg/dL   Patient has failed these meds in past:  Patient is currently {CHL Controlled/Uncontrolled:[phone]} on the following medications: aspirin 81 mg daily  We discussed:  {CHL HP Upstream Pharmacy discussion:[phone]}  Plan  Continue {CHL HP Upstream Pharmacy [id number]   GERD/GI   Patient has failed these meds in past: *** Patient is currently {CHL Controlled/Uncontrolled:[phone]} on the following medications: pantoprazole 40 mg daily, ondansetron 4 mg PRN, Miralax prn  We discussed:  {CHL HP Upstream Pharmacy discussion:[phone]}  Plan  Continue {CHL HP Upstream Pharmacy [id number]   Arthritis/Pain   Patient has failed these meds in past: *** Patient is currently {CHL  Controlled/Uncontrolled:[phone]} on the following medications: Tylenol 650 mg q6h prn, methocarbamol 500 mg q6h prn  We discussed:  {CHL HP Upstream Pharmacy discussion:[phone]}  Plan  Continue {CHL HP Upstream Pharmacy [phone]}    Health Maintenance   Patient is currently {CHL Controlled/Uncontrolled:[phone]} on the following medications: ***  We discussed:  {CHL HP Upstream Pharmacy discussion:[phone]}  Plan  Continue {CHL HP Upstream Pharmacy [phone]}   Medication Management   [facility] ***pill box Pt endorses ***% compliance  We discussed:  ***  Plan  ***     Follow up: ***

## 2019-07-06 NOTE — Telephone Encounter (Signed)
  Chronic Care Management   Outreach Note  07/06/2019 Name: [name] [name] MRN: [id number] DOB: 03/18/1946  Referred by: Hoyt Koch, MD Reason for referral : Chronic Care Management (PharmD Initial phone visit)   An unsuccessful telephone outreach was attempted today. The patient was referred to the pharmacist for assistance with care management and care coordination.   Follow Up Plan: Left message for patient to reschedule her initial CCM visit with clinic pharmacist.  Charlene Brooke, PharmD Clinical Pharmacist [facility] [phone]

## 2019-07-06 NOTE — Therapy (Signed)
[place], [place], [address] Phone: [phone]   Fax:  [phone]  Physical Therapy Treatment  Patient Details  Name: [name] [name] MRN: [id number] Date of Birth: [birth year] Referring Provider (PT): Darci Current   Encounter Date: 07/06/2019  PT End of Session - 07/06/19 1504    Visit Number  34    Number of Visits  34    Date for PT Re-Evaluation  07/13/19    PT Start Time  1300    PT Stop Time  1355    PT Time Calculation (min)  55 min    Activity Tolerance  Patient tolerated treatment well    Behavior During Therapy  [facility] for tasks assessed/performed       Past Medical History:  Diagnosis Date  . Acute kidney injury ([place])   . Aortic stenosis, mild 11/17/2013  . Arthritis    "both knees, spine, back, fingers" (11/29/2013)  . CHF (congestive heart failure) ([place])   . Edema   . GERD (gastroesophageal reflux disease)   . Heart murmur   . History of diastolic dysfunction    Echo [id number] with diastolic dysfunction  . HTN (hypertension)   . Morbid obesity ([place])   . OA (osteoarthritis)   . PAC (premature atrial contraction)    per prior Holter  . Palpitations   . Pneumonia    "as a child"  . PVC's (premature ventricular contractions)    per prior Holter  . SVT (supraventricular tachycardia) ([place])   . Tachycardia    noted at 07/28/11 visit. Started on beta blocker. Possible atrial flutter vs long PR tachycardia/AVNRT  . Type II diabetes mellitus ([place])     Past Surgical History:  Procedure Laterality Date  . CESAREAN SECTION  1985  . CESAREAN SECTION  1985  . SUPRAVENTRICULAR TACHYCARDIA ABLATION  11/29/2013  . SUPRAVENTRICULAR TACHYCARDIA ABLATION N/A 11/29/2013   Procedure: SUPRAVENTRICULAR TACHYCARDIA ABLATION;  Surgeon: Evans Lance, MD;  [facility] C[facility] LAB;  Service: Cardiovascular;  Laterality: N/A;  . US ECHOCARDIOGRAPHY  07/25/2008   EF 55-60%  . VAGINAL DELIVERY       There were no vitals filed for this visit.  Subjective Assessment - 07/06/19 1505    Subjective  Pt states that she has been using her pump. She felt like her leg was weeping (no weeping noted)    Pertinent History  osteo arthritis, hx cellulitis, phlebitis and history of edema in BLE, Hx Acute kidney injury, CHF and DM II    Patient Stated Goals  I want to get this swelling down and heal my wound.    Currently in Pain?  Yes    Pain Score  8     Pain Location  Knee    Pain Orientation  Right;Left    Pain Descriptors / Indicators  Aching    Pain Type  Chronic pain    Pain Onset  More than a month ago    Pain Frequency  Intermittent    Aggravating Factors   movement    Pain Relieving Factors  rest                       [facility] Adult PT Treatment/Exercise - 07/06/19 0001      Manual Therapy   Manual Therapy  Compression Bandaging;Edema management    Manual therapy comments  Pt LE were washed after removing wraps. All areas on the L lower leg have healed. She has  slight increase in swelling on the R lower leg that is concerning for possible re-occurance of cellulitis. She states she is going to see her MD and will mention this.     Compression Bandaging  No toe wraps, Double tubigrip used at Bil thighs and single layer at BIl lower legs, 2 artiflex used then 3 short stretch bandages from foot to below the knee after cleansing legs.              PT Education - 07/06/19 1507    Education Details  Continue to pump at home and notify MD of possible re-occurance of cellulitis on the RLE    Person(s) Educated  Patient    Methods  Explanation    Comprehension  Verbalized understanding       PT Short Term Goals - 06/15/19 1445      PT SHORT TERM GOAL #1   Title  Pt will be independent with HEP    Baseline  Pt is working on her HEP occasionally at home.    Status  On-going    Target Date  07/13/19        PT Long Term Goals - 06/15/19 1445      PT LONG TERM  GOAL #1   Title  Patient will have reduction of L lower leg limb girth by 3 cm    Baseline  Pt has reduced from her initial evaluation and has been maintaining well continue to measure    Time  3    Period  Weeks    Status  On-going    Target Date  07/13/19      PT LONG TERM GOAL #2   Title  Patient to be properly fitted with compression garment to wear on daily basis.    Baseline  Pt has received edema and will see if this is adequate amount of compression for the LE    Time  3    Period  Weeks    Status  On-going    Target Date  07/13/19      PT LONG TERM GOAL #3   Title  Patient will be independent in self-care management principles including self-massage/bandaging and long term management plan for edema.    Baseline  Pt has started to pump more frequently but continues with difficulty managing her lymphedema.    Time  3    Period  Weeks    Status  On-going    Target Date  07/13/19            Plan - 07/06/19 1504    Clinical Impression Statement  Pt presents to physical therapy with improved skin on the L lower leg. She does seem to have a goblet shape forming on the R lower leg w/o heat or redness but the shape is concerning for possible cellulitis. Discussed this with the patient and she will notify her MD. BIL lower extremities were cleaned and bandages with layered compression wrap from foot to below the knee. Contacted Veronica with [facility] again today and left a message again to get assistance for patient for proper compression. Pt will benefit from continued POC at this time.    Personal Factors and Comorbidities  Age;Comorbidity 3+    Comorbidities  CHF, acute kidney injury, DM II    Rehab Potential  Good    PT Frequency  2x / week    PT Duration  3 weeks    PT Treatment/Interventions  Electrical Stimulation;Cryotherapy;Gait training;Stair training;Functional mobility  training;Therapeutic activities;Therapeutic exercise;Balance training;Neuromuscular  re-education;Patient/family education;Manual lymph drainage;Compression bandaging;Passive range of motion;Taping;Vasopneumatic Device    PT Next Visit Plan  Continue with wrapping, help order garments if needed. Ask if she has spoke to Education officer, museum?    PT Home Exercise Plan  pump and exercises    Consulted and Agree with Plan of Care  Patient       Patient will benefit from skilled therapeutic intervention in order to improve the following deficits and impairments:  Difficulty walking, Increased edema  Visit Diagnosis: Lymphedema  Other abnormalities of gait and mobility     Problem List Patient Active Problem List   Diagnosis Date Noted  . Postnasal drip 11/16/2017  . Sensorineural hearing loss (SNHL), bilateral 11/16/2017  . Tinnitus of both ears 11/16/2017  . Obesity, Class II, BMI 35-39.9, isolated (see actual BMI) 11/08/2017  . CKD (chronic kidney disease), stage III 11/08/2017  . Anemia 11/07/2017  . Thrombocytosis ([place]) 11/07/2017  . Lymphedema 11/07/2017  . Routine general medical examination at a health care facility 05/10/2014  . Paroxysmal SVT (supraventricular tachycardia) ([place]) 11/28/2013  . Physical deconditioning 11/23/2013  . Generalized weakness 11/17/2013  . Nausea 11/17/2013  . Multilevel spine pain 11/17/2013  . Aortic stenosis, mild 11/17/2013  . DM type 2 (diabetes mellitus, type 2) ([place]) 02/02/2013  . Arthritis 10/19/2012  . Mixed incontinence 10/19/2012  . GERD (gastroesophageal reflux disease) 04/20/2011  . HTN (hypertension) 01/20/2011  . Morbid obesity ([place])     Ander Purpura, PT 07/06/2019, 3:10 PM  [place], [place], [address] Phone: [phone]   Fax:  [phone]  Name: [name] [name] MRN: [id number] Date of Birth: 12-Jun-1945

## 2019-07-11 ENCOUNTER — Ambulatory Visit: Payer: PPO

## 2019-07-11 ENCOUNTER — Ambulatory Visit (INDEPENDENT_AMBULATORY_CARE_PROVIDER_SITE_OTHER): Payer: PPO

## 2019-07-11 ENCOUNTER — Other Ambulatory Visit: Payer: Self-pay

## 2019-07-11 VITALS — BP 140/80 | HR 67 | Temp 98.1°F | Resp 16 | Ht 71.0 in | Wt 300.6 lb

## 2019-07-11 DIAGNOSIS — Z0000 Encounter for general adult medical examination without abnormal findings: Secondary | ICD-10-CM | POA: Diagnosis not present

## 2019-07-11 NOTE — Progress Notes (Addendum)
Subjective:   [name] [name] is a 74 y.o. female who presents for Medicare Annual (Subsequent) preventive examination.  Review of Systems:  No ROS Medicare Wellness Visit Sleep Patterns: No issues with falling sleep; wakes after 4-6 hours of sleep Home Safety/Smoke Alarms: Feels safe in home; Smoke alarms in place. Living environment: Handicap accessible apartment; Lives alone. Seat Belt Safety/Bike Helmet: Wears seat belt.       Objective:     Vitals: BP 140/80 (BP Location: Left Arm, Patient Position: Sitting, Cuff Size: Normal)   Pulse 67   Temp 98.1 F (36.7 C)   Resp 16   Ht 5\' 11"  (1.803 m)   Wt (!) 300 lb 9.6 oz (136.4 kg)   SpO2 98%   BMI 41.93 kg/m   Body mass index is 41.93 kg/m.  Advanced Directives 07/11/2019 01/07/2019 01/06/2019 05/13/2018 11/06/2017 11/06/2017 10/16/2017  Does Patient Have a Medical Advance Directive? No No No No No No No  Would patient like information on creating a medical advance directive? - No - Patient declined - Yes (ED - Information included in AVS) No - Patient declined No - Patient declined No - Patient declined    Tobacco Social History   Tobacco Use  Smoking Status Former Smoker  . Packs/day: 2.00  . Years: 15.00  . Pack years: 30.00  . Types: Cigarettes  . Quit date: 11/23/1985  . Years since quitting: 33.6  Smokeless Tobacco Never Used     Counseling given: No   Clinical Intake:  Pre-visit preparation completed: Yes  Pain : 0-10 Pain Score: 8  Faces Pain Scale: Hurts whole lot Pain Type: Chronic pain Pain Location: Leg Pain Orientation: Right, Left Pain Descriptors / Indicators: Constant Pain Onset: More than a month ago Pain Frequency: Constant Pain Relieving Factors: tylenol  Faces Pain Scale: Hurts whole lot Pain Relieving Factors: tylenol  Nutritional Status: BMI > 30  Obese Nutritional Risks: None Diabetes: Yes CBG done?: No Did pt. bring in CBG monitor from home?: No  How often do you need to have  someone help you when you read instructions, pamphlets, or other written materials from your doctor or pharmacy?: 1 - Never What is the last grade level you completed in school?: 2 years of college  Interpreter Needed?: No  Information entered by :: Kash Davie N. Lowell Guitar, LPN  Past Medical History:  Diagnosis Date  . Acute kidney injury ([place])   . Aortic stenosis, mild 11/17/2013  . Arthritis    "both knees, spine, back, fingers" (11/29/2013)  . CHF (congestive heart failure) ([place])   . Edema   . GERD (gastroesophageal reflux disease)   . Heart murmur   . History of diastolic dysfunction    Echo [id number] with diastolic dysfunction  . HTN (hypertension)   . Morbid obesity ([place])   . OA (osteoarthritis)   . PAC (premature atrial contraction)    per prior Holter  . Palpitations   . Pneumonia    "as a child"  . PVC's (premature ventricular contractions)    per prior Holter  . SVT (supraventricular tachycardia) ([place])   . Tachycardia    noted at 07/28/11 visit. Started on beta blocker. Possible atrial flutter vs long PR tachycardia/AVNRT  . Type II diabetes mellitus ([place])    Past Surgical History:  Procedure Laterality Date  . CESAREAN SECTION  1985  . CESAREAN SECTION  1985  . SUPRAVENTRICULAR TACHYCARDIA ABLATION  11/29/2013  . SUPRAVENTRICULAR TACHYCARDIA ABLATION N/A 11/29/2013   Procedure: SUPRAVENTRICULAR  TACHYCARDIA ABLATION;  Surgeon: Evans Lance, MD;  Location[facility] CATH LAB;  Service: Cardiovascular;  Laterality: N/A;  . US ECHOCARDIOGRAPHY  07/25/2008   EF 55-60%  . VAGINAL DELIVERY     Family History  Problem Relation Age of Onset  . Alzheimer's disease Mother   . Lung cancer Mother   . COPD Father   . Diabetes Maternal Uncle   . Stroke Neg Hx   . Colon cancer Neg Hx   . Stomach cancer Neg Hx   . Esophageal cancer Neg Hx    Social History   Socioeconomic History  . Marital status: Single    Spouse name: Not on file  . Number of children: 2  . Years of  education: Not on file  . Highest education level: Not on file  Occupational History  . 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 Smoking status: Former Smoker    Packs/day: 2.00    Years: 15.00    Pack years: 30.00    Types: Cigarettes    Quit date: 11/23/1985    Years since quitting: 33.6  . Smokeless tobacco: Never Used  Substance and Sexual Activity  . Alcohol use: No  . Drug use: No  . Sexual activity: Never  Other Topics Concern  . Not on file  Social History Narrative  . Not on file   Social Determinants of Health   Financial Resource Strain:   . Difficulty of Paying Living Expenses:   Food Insecurity:   . Worried About Charity fundraiser in the Last Year:   . Arboriculturist in the Last Year:   Transportation Needs:   . Film/video editor (Medical):   Marland Kitchen Lack of Transportation (Non-Medical):   Physical Activity:   . Days of Exercise per Week:   . Minutes of Exercise per Session:   Stress:   . Feeling of Stress :   Social Connections:   . Frequency of Communication with Friends and Family:   . Frequency of Social Gatherings with Friends and Family:   . Attends Religious Services:   . Active Member of Clubs or Organizations:   . Attends Archivist Meetings:   Marland Kitchen Marital Status:     Outpatient Encounter Medications as of 07/11/2019  Medication Sig  . acetaminophen (TYLENOL) 325 MG tablet Take 2 tablets (650 mg total) by mouth every 6 (six) hours as needed for mild pain (or Fever >/= 101).  Marland Kitchen aspirin EC 81 MG tablet Take 81 mg by mouth daily with breakfast. Takes once or twice weekly  . doxycycline (VIBRA-TABS) 100 MG tablet Take 1 tablet (100 mg total) by mouth 2 (two) times daily.  . furosemide (LASIX) 40 MG tablet TAKE 1 TABLET BY MOUTH EVERY DAY  . losartan (COZAAR) 25 MG tablet TAKE 4 TABLETS (100 MG TOTAL) BY MOUTH DAILY.  . metFORMIN (GLUCOPHAGE) 500 MG tablet Take 1 tablet (500 mg total) by mouth  daily with breakfast.  . methocarbamol (ROBAXIN) 500 MG tablet Take 1 tablet (500 mg total) by mouth every 6 (six) hours as needed for muscle spasms.  . metoprolol tartrate (LOPRESSOR) 50 MG tablet TAKE 1 TABLET BY MOUTH TWICE A DAY  . Neomycin-Bacitracin-Polymyxin ([employer] ANTIBIOTIC) [phone] OINT APPLY AFTER CLEANING BEFORE WRAPPING LGS (Patient taking differently: Apply 1 application topically daily. Apply after cleaning before wrapping lgs)  . ondansetron (ZOFRAN) 4 MG tablet Take 1 tablet (4 mg  total) by mouth every 8 (eight) hours as needed for nausea or vomiting.  . pantoprazole (PROTONIX) 40 MG tablet TAKE 1 TABLET BY MOUTH EVERY DAY  . polyethylene glycol (MIRALAX / GLYCOLAX) packet Take 17 g by mouth daily.   No facility-administered encounter medications on file as of 07/11/2019.    Activities of Daily Living In your present state of health, do you have any difficulty performing the following activities: 07/11/2019 01/07/2019  Hearing? Y N  Vision? Y N  Difficulty concentrating or making decisions? N N  Walking or climbing stairs? Y Y  Comment uses a walker -  Dressing or bathing? N N  Doing errands, shopping? N N  Preparing Food and eating ? N -  Using the Toilet? N -  In the past six months, have you accidently leaked urine? Y -  Do you have problems with loss of bowel control? N -  Managing your Medications? N -  Managing your Finances? N -  Housekeeping or managing your Housekeeping? N -  Some recent data might be hidden    Patient Care Team: Hoyt Koch, MD as PCP - General (Internal Medicine) Martinique, Peter M, MD as Consulting Physician (Cardiology) Marzetta Board, DPM as Consulting Physician (Podiatry) [facility] Charlton Haws, [facility] (Pharmacist)    Assessment:   This is a routine wellness examination for Makendra.  Exercise Activities and Dietary recommendations Current Exercise Habits: The patient does not participate  in regular exercise at present  Goals    . Client understands the importance of follow-up with providers by attending scheduled visits    . Patient Stated     Monitor diet for sugar, carbohydrates, and salt. Continue to be as active as possible socially and physically by getting out daily.     . Patient Stated     Wants to lose 100 pounds and be able to walk without using her walker.       Fall Risk Fall Risk  07/11/2019 05/13/2018 10/11/2017 09/10/2017 08/02/2017  Falls in the past year? 0 1 Yes Yes Yes  Number falls in past yr: 0 0 1 1 1   Injury with Fall? 0 0 No No No  Risk Factor Category  - - - - -  Comment - - - - -  Risk for fall due to : Impaired balance/gait Impaired balance/gait;Impaired mobility History of fall(s);Impaired balance/gait;Impaired mobility History of fall(s);Impaired balance/gait;Impaired mobility History of fall(s);Impaired balance/gait;Impaired mobility  Risk for fall due to: Comment - - - - -  Follow up Falls evaluation completed;Education provided;Falls prevention discussed Falls prevention discussed - Falls evaluation completed;Falls prevention discussed;Education provided Falls evaluation completed;Falls prevention discussed;Education provided   Is the patient's home free of loose throw rugs in walkways, pet beds, electrical cords, etc?   yes      Grab bars in the bathroom? yes      Handrails on the stairs?   yes      Adequate lighting?   yes  Depression Screen PHQ 2/9 Scores 07/11/2019 05/13/2018 05/13/2018 10/11/2017  PHQ - 2 Score 0 0 0 0  PHQ- 9 Score - 3 - -     Cognitive Function MMSE - Mini Mental State Exam 05/11/2017  Orientation to time 5  Orientation to Place 5  Registration 3  Attention/ Calculation 4  Recall 2  Language- name 2 objects 2  Language- repeat 1  Language- follow 3 step command 3  Language- read & follow direction 1  Write  a sentence 1  Copy design 1  Total score 28     6CIT Screen 07/11/2019  What Year? 0 points  What month? 0  points  What time? 0 points  Count back from 20 0 points  Months in reverse 0 points  Repeat phrase 0 points  Total Score 0    Immunization History  Administered Date(s) Administered  . Influenza, High Dose Seasonal PF 01/23/2013, 03/03/2016  . Influenza,inj,Quad PF,6+ Mos 01/05/2014, 03/05/2015  . Influenza-Unspecified 01/18/2017, 01/17/2018  . Pneumococcal Conjugate-13 07/24/2014  . Pneumococcal Polysaccharide-23 10/19/2012  . Tdap 10/19/2012    Qualifies for Shingles Vaccine? declined  Screening Tests Health Maintenance  Topic Date Due  . MAMMOGRAM  07/01/2019  . HEMOGLOBIN A1C  07/08/2019  . Hepatitis C Screening  03/20/2020 (Originally 04-24-1945)  . OPHTHALMOLOGY EXAM  07/19/2019  . INFLUENZA VACCINE  11/05/2019  . FOOT EXAM  03/20/2020  . TETANUS/TDAP  10/20/2022  . COLONOSCOPY  07/30/2024  . DEXA SCAN  Completed  . PNA vac Low Risk Adult  Completed    Cancer Screenings: Lung: Low Dose CT Chest recommended if Age 70-80 years, 30 pack-year currently smoking OR have quit w/in 15years. Patient does not qualify. Breast:  Up to date on Mammogram? Yes   Up to date of Bone Density/Dexa? Yes Colorectal: Yes, due 07/29/2024      Plan:     Reviewed health maintenance screenings with patient today and relevant education, vaccines, and/or referrals were provided.    Continue doing brain stimulating activities (puzzles, reading, adult coloring books, staying active) to keep memory sharp.    Continue to eat heart healthy diet (full of fruits, vegetables, whole grains, lean protein, water--limit salt, fat, and sugar intake) and increase physical activity as tolerated.   I have personally reviewed and noted the following in the patient's chart:   . Medical and social history . Use of alcohol, tobacco or illicit drugs  . Current medications and supplements . Functional ability and status . Nutritional status . Physical activity . Advanced directives . List of other  physicians . Hospitalizations, surgeries, and ER visits in previous 12 months . Vitals . Screenings to include cognitive, depression, and falls . Referrals and appointments  In addition, I have reviewed and discussed with patient certain preventive protocols, quality metrics, and best practice recommendations. A written personalized care plan for preventive services as well as general preventive health recommendations were provided to patient.     Sheral Flow, LPN  [id number] Nurse Health Advisor    Medical screening examination/treatment/procedure(s) were performed by non-physician practitioner and as supervising physician I was immediately available for consultation/collaboration. I agree with above. Binnie Rail, MD

## 2019-07-11 NOTE — Patient Instructions (Signed)
Ms. Gleba , Thank you for taking time to come for your Medicare Wellness Visit. I appreciate your ongoing commitment to your health goals. Please review the following plan we discussed and let me know if I can assist you in the future.   Screening recommendations/referrals: Colorectal Screening: due 07/29/2024 Mammogram: due 09/28/2019 Bone Density: due 09/28/2019  Vision and Dental Exams: Recommended annual ophthalmology exams for early detection of glaucoma and other disorders of the eye Recommended annual dental exams for proper oral hygiene  Diabetic Exams: Diabetic Eye Exam: 07/19/2018 Diabetic Foot Exam: 03/21/2019  Vaccinations: Influenza vaccine: 01/01/2019 Pneumococcal vaccine: 10/19/2012 & 07/24/2014 Tdap vaccine: 10/19/2012 Shingles vaccine: Please call your insurance company to determine your out of pocket expense for the Shingrix vaccine. You may receive this vaccine at your local pharmacy.  Advanced directives: Advance directives discussed with you today. I have provided a copy for you to complete at home and have notarized. Once this is complete please bring a copy in to our office so we can scan it into your chart.  Goals:  Recommend to drink at least 6-8 8oz glasses of water per day.  Recommend to exercise for at least 150 minutes per week.  Recommend to remove any items from the home that may cause slips or trips.  Recommend to decrease portion sizes by eating 3 small healthy meals and at least 2 healthy snacks per day.  Recommend to begin DASH diet as directed below  Recommend to continue efforts to reduce smoking habits until no longer smoking. Smoking Cessation literature is attached below.  Next appointment: Please schedule your Annual Wellness Visit with your Nurse Health Advisor in one year.  Preventive Care 74 Years and Older, Female Preventive care refers to lifestyle choices and visits with your health care provider that can promote health and  wellness. What does preventive care include?  A yearly physical exam. This is also called an annual well check.  Dental exams once or twice a year.  Routine eye exams. Ask your health care provider how often you should have your eyes checked.  Personal lifestyle choices, including:  Daily care of your teeth and gums.  Regular physical activity.  Eating a healthy diet.  Avoiding tobacco and drug use.  Limiting alcohol use.  Practicing safe sex.  Taking low-dose aspirin every day if recommended by your health care provider.  Taking vitamin and mineral supplements as recommended by your health care provider. What happens during an annual well check? The services and screenings done by your health care provider during your annual well check will depend on your age, overall health, lifestyle risk factors, and family history of disease. Counseling  Your health care provider may ask you questions about your:  Alcohol use.  Tobacco use.  Drug use.  Emotional well-being.  Home and relationship well-being.  Sexual activity.  Eating habits.  History of falls.  Memory and ability to understand (cognition).  Work and work Statistician.  Reproductive health. Screening  You may have the following tests or measurements:  Height, weight, and BMI.  Blood pressure.  Lipid and cholesterol levels. These may be checked every 5 years, or more frequently if you are over 61 years old.  Skin check.  Lung cancer screening. You may have this screening every year starting at age 103 if you have a 30-pack-year history of smoking and currently smoke or have quit within the past 15 years.  Fecal occult blood test (FOBT) of the stool. You may have this  test every year starting at age 56.  Flexible sigmoidoscopy or colonoscopy. You may have a sigmoidoscopy every 5 years or a colonoscopy every 10 years starting at age 41.  Hepatitis C blood test.  Hepatitis B blood test.  Sexually  transmitted disease (STD) testing.  Diabetes screening. This is done by checking your blood sugar (glucose) after you have not eaten for a while (fasting). You may have this done every 1-3 years.  Bone density scan. This is done to screen for osteoporosis. You may have this done starting at age 35.  Mammogram. This may be done every 1-2 years. Talk to your health care provider about how often you should have regular mammograms. Talk with your health care provider about your test results, treatment options, and if necessary, the need for more tests. Vaccines  Your health care provider may recommend certain vaccines, such as:  Influenza vaccine. This is recommended every year.  Tetanus, diphtheria, and acellular pertussis (Tdap, Td) vaccine. You may need a Td booster every 10 years.  Zoster vaccine. You may need this after age 62.  Pneumococcal 13-valent conjugate (PCV13) vaccine. One dose is recommended after age 76.  Pneumococcal polysaccharide (PPSV23) vaccine. One dose is recommended after age 57. Talk to your health care provider about which screenings and vaccines you need and how often you need them. This information is not intended to replace advice given to you by your health care provider. Make sure you discuss any questions you have with your health care provider. Document Released: 04/19/2015 Document Revised: 12/11/2015 Document Reviewed: 01/22/2015 Elsevier Interactive Patient Education  2017 [employer] Prevention in the Home Falls can cause injuries. They can happen to people of all ages. There are many things you can do to make your home safe and to help prevent falls. What can I do on the outside of my home?  Regularly fix the edges of walkways and driveways and fix any cracks.  Remove anything that might make you trip as you walk through a door, such as a raised step or threshold.  Trim any bushes or trees on the path to your home.  Use bright outdoor  lighting.  Clear any walking paths of anything that might make someone trip, such as rocks or tools.  Regularly check to see if handrails are loose or broken. Make sure that both sides of any steps have handrails.  Any raised decks and porches should have guardrails on the edges.  Have any leaves, snow, or ice cleared regularly.  Use sand or salt on walking paths during winter.  Clean up any spills in your garage right away. This includes oil or grease spills. What can I do in the bathroom?  Use night lights.  Install grab bars by the toilet and in the tub and shower. Do not use towel bars as grab bars.  Use non-skid mats or decals in the tub or shower.  If you need to sit down in the shower, use a plastic, non-slip stool.  Keep the floor dry. Clean up any water that spills on the floor as soon as it happens.  Remove soap buildup in the tub or shower regularly.  Attach bath mats securely with double-sided non-slip rug tape.  Do not have throw rugs and other things on the floor that can make you trip. What can I do in the bedroom?  Use night lights.  Make sure that you have a light by your bed that is easy to reach.  Do not use any sheets or blankets that are too big for your bed. They should not hang down onto the floor.  Have a firm chair that has side arms. You can use this for support while you get dressed.  Do not have throw rugs and other things on the floor that can make you trip. What can I do in the kitchen?  Clean up any spills right away.  Avoid walking on wet floors.  Keep items that you use a lot in easy-to-reach places.  If you need to reach something above you, use a strong step stool that has a grab bar.  Keep electrical cords out of the way.  Do not use floor polish or wax that makes floors slippery. If you must use wax, use non-skid floor wax.  Do not have throw rugs and other things on the floor that can make you trip. What can I do with my  stairs?  Do not leave any items on the stairs.  Make sure that there are handrails on both sides of the stairs and use them. Fix handrails that are broken or loose. Make sure that handrails are as long as the stairways.  Check any carpeting to make sure that it is firmly attached to the stairs. Fix any carpet that is loose or worn.  Avoid having throw rugs at the top or bottom of the stairs. If you do have throw rugs, attach them to the floor with carpet tape.  Make sure that you have a light switch at the top of the stairs and the bottom of the stairs. If you do not have them, ask someone to add them for you. What else can I do to help prevent falls?  Wear shoes that:  Do not have high heels.  Have rubber bottoms.  Are comfortable and fit you well.  Are closed at the toe. Do not wear sandals.  If you use a stepladder:  Make sure that it is fully opened. Do not climb a closed stepladder.  Make sure that both sides of the stepladder are locked into place.  Ask someone to hold it for you, if possible.  Clearly mark and make sure that you can see:  Any grab bars or handrails.  First and last steps.  Where the edge of each step is.  Use tools that help you move around (mobility aids) if they are needed. These include:  Canes.  Walkers.  Scooters.  Crutches.  Turn on the lights when you go into a dark area. Replace any light bulbs as soon as they burn out.  Set up your furniture so you have a clear path. Avoid moving your furniture around.  If any of your floors are uneven, fix them.  If there are any pets around you, be aware of where they are.  Review your medicines with your doctor. Some medicines can make you feel dizzy. This can increase your chance of falling. Ask your doctor what other things that you can do to help prevent falls. This information is not intended to replace advice given to you by your health care provider. Make sure you discuss any  questions you have with your health care provider. Document Released: 01/17/2009 Document Revised: 08/29/2015 Document Reviewed: 04/27/2014 Elsevier Interactive Patient Education  2017 [employer].

## 2019-07-13 ENCOUNTER — Ambulatory Visit: Payer: PPO

## 2019-07-19 NOTE — Progress Notes (Signed)
[name] [name] ([id number]) Visit Report for 05/18/2019 Arrival Information Details Patient Name: Date of Service: [name] [name] 05/18/2019 11:30 AM Medical Record [id number] Patient Account Number: [id number] Date of Birth/Sex: Treating RN: 08/07/1945 (74 y.o. Debby Bud Primary Care Terryann Verbeek: [name] Other Clinician: Referring Anneka Studer: Treating [place] Agostinelli/Extender:Robson, Tenna Child, [place] Siren in Treatment: 18 Visit Information History Since Last Visit Walker Added or deleted any medications: No Patient Arrived: Any new allergies or adverse reactions: No Arrival Time: 11:55 Had a fall or experienced change in No Accompanied By: self activities of daily living that may affect Transfer Assistance: None risk of falls: Patient Identification Verified: Yes Signs or symptoms of abuse/neglect since last No Secondary Verification Process Completed: Yes visito Patient Requires Transmission-Based No Hospitalized since last visit: No Precautions: Implantable device outside of the clinic excluding No Patient Has Alerts: No cellular tissue based products placed in the center since last visit: Has Dressing in Place as Prescribed: Yes Pain Present Now: No Electronic Signature(s) Signed: 07/19/2019 9:23:26 AM By: Sandre Kitty Entered By: Sandre Kitty on 05/18/2019 11:55:27 -------------------------------------------------------------------------------- Clinic Level of Care Assessment Details Patient Name: Date of Service: [name] [name] 05/18/2019 11:30 AM Medical Record [id number] Patient Account Number: [id number] Date of Birth/Sex: Treating RN: September 27, 1945 (74 y.o. Helene Shoe, Tammi Klippel Primary Care Shaquel Chavous: [name] Other Clinician: Referring Bobak Oguinn: Treating Jamonta Goerner/Extender:Robson, Tenna Child, [place] Siren in Treatment: 18 Clinic Level of Care Assessment Items TOOL 4 Quantity Score X - Use when only an  EandM is performed on FOLLOW-UP visit 1 0 ASSESSMENTS - Nursing Assessment / Reassessment X - Reassessment of Co-morbidities (includes updates in patient status) 1 10 X - Reassessment of Adherence to Treatment Plan 1 5 ASSESSMENTS - Wound and Skin Assessment / Reassessment X - Simple Wound Assessment / Reassessment - one wound 1 5 []  - Complex Wound Assessment / Reassessment - multiple wounds 0 X - Dermatologic / Skin Assessment (not related to wound area) 1 10 ASSESSMENTS - Focused Assessment X - Circumferential Edema Measurements - multi extremities 1 5 X - Nutritional Assessment / Counseling / Intervention 1 10 []  - Lower Extremity Assessment (monofilament, tuning fork, pulses) 0 []  - Peripheral Arterial Disease Assessment (using hand held doppler) 0 ASSESSMENTS - Ostomy and/or Continence Assessment and Care []  - Incontinence Assessment and Management 0 []  - Ostomy Care Assessment and Management (repouching, etc.) 0 PROCESS - Coordination of Care X - Simple Patient / Family Education for ongoing care 1 15 []  - Complex (extensive) Patient / Family Education for ongoing care 0 X - Staff obtains Programmer, systems, Records, Test Results / Process Orders 1 10 []  - Staff telephones HHA, Nursing Homes / Clarify orders / etc 0 []  - Routine Transfer to another Facility (non-emergent condition) 0 []  - Routine Hospital Admission (non-emergent condition) 0 []  - New Admissions / Biomedical engineer / Ordering NPWT, Apligraf, etc. 0 []  - Emergency Hospital Admission (emergent condition) 0 X - Simple Discharge Coordination 1 10 []  - Complex (extensive) Discharge Coordination 0 PROCESS - Special Needs []  - Pediatric / Minor Patient Management 0 []  - Isolation Patient Management 0 []  - Hearing / Language / Visual special needs 0 []  - Assessment of Community assistance (transportation, D/C planning, etc.) 0 []  - Additional assistance / Altered mentation 0 []  - Support Surface(s) Assessment (bed,  cushion, seat, etc.) 0 INTERVENTIONS - Wound Cleansing / Measurement X - Simple Wound Cleansing - one wound 1 5 []  - Complex Wound Cleansing - multiple wounds 0 X - Wound Imaging (photographs -  any number of wounds) 1 5 []  - Wound Tracing (instead of photographs) 0 X - Simple Wound Measurement - one wound 1 5 []  - Complex Wound Measurement - multiple wounds 0 INTERVENTIONS - Wound Dressings []  - Small Wound Dressing one or multiple wounds 0 []  - Medium Wound Dressing one or multiple wounds 0 []  - Large Wound Dressing one or multiple wounds 0 []  - Application of Medications - topical 0 []  - Application of Medications - injection 0 INTERVENTIONS - Miscellaneous []  - External ear exam 0 []  - Specimen Collection (cultures, biopsies, blood, body fluids, etc.) 0 []  - Specimen(s) / Culture(s) sent or taken to Lab for analysis 0 []  - Patient Transfer (multiple staff / Harrel Lemon Lift / Similar devices) 0 []  - Simple Staple / Suture removal (25 or less) 0 []  - Complex Staple / Suture removal (26 or more) 0 []  - Hypo / Hyperglycemic Management (close monitor of Blood Glucose) 0 []  - Ankle / Brachial Index (ABI) - do not check if billed separately 0 X - Vital Signs 1 5 Has the patient been seen at the hospital within the last three years: Yes Total Score: 100 Level Of Care: New/Established - Level 3 Electronic Signature(s) Signed: 05/18/2019 5:57:11 PM By: Deon Pilling Entered By: Deon Pilling on 05/18/2019 13:11:57 -------------------------------------------------------------------------------- Lower Extremity Assessment Details Patient Name: Date of Service: [name] [name] 05/18/2019 11:30 AM Medical Record [id number] Patient Account Number: [id number] Date of Birth/Sex: Treating RN: 04-22-1945 (74 y.o. Clearnce Sorrel Primary Care Ren Grasse: [name] Other Clinician: Referring Nawaal Alling: Treating Arieal Cuoco/Extender:Robson, Tenna Child, [place] Siren in Treatment:  18 Edema Assessment Assessed: [Left: No] [Right: No] Edema: [Left: Yes] [Right: Yes] Calf Left: Right: Point of Measurement: 30 cm From Medial Instep 50.4 cm 51.4 cm Ankle Left: Right: Point of Measurement: 8 cm From Medial Instep 30.8 cm 29 cm Vascular Assessment Pulses: Dorsalis Pedis Palpable: [Left:Yes] [Right:Yes] Electronic Signature(s) Signed: 05/18/2019 5:22:04 PM By: Kela Millin Entered By: Kela Millin on 05/18/2019 12:12:31 -------------------------------------------------------------------------------- Multi Wound Chart Details Patient Name: Date of Service: ARYANNA, MATIC 05/18/2019 11:30 AM Medical Record [id number] Patient Account Number: [id number] Date of Birth/Sex: Treating RN: 01/24/1946 (74 y.o. Helene Shoe, Tammi Klippel Primary Care Ksenia Kunz: [name] Other Clinician: Referring Dragan Tamburrino: Treating Jaleisa Brose/Extender:Robson, Tenna Child, [place] Siren in Treatment: 18 Vital Signs Height(in): 71 Pulse(bpm): 61 Weight(lbs): 240 Blood Pressure(mmHg): 144/64 Body Mass Index(BMI): 33 Temperature(F): 98.5 Respiratory 18 Rate(breaths/min): Photos: [39:No Photos] [41:No Photos] [N/A:N/A] Wound Location: [39:Left, Anterior Lower Leg] [41:Left, Plantar Toe Second N/A] Wounding Event: [39:Gradually Appeared] [41:Gradually Appeared] [N/A:N/A] Primary Etiology: [39:Lymphedema] [41:Diabetic Wound/Ulcer of the N/A Lower Extremity] Date Acquired: [39:04/11/2019] [41:04/07/2019] [N/A:N/A] Weeks of Treatment: [39:5] [41:1] [N/A:N/A] Wound Status: [39:Open] [41:Open] [N/A:N/A] Measurements L x W x D [39:0x0x0] [41:0x0x0] [N/A:N/A] (cm) Area (cm) : [39:0] [41:0] [N/A:N/A] Volume (cm) : [39:0] [41:0] [N/A:N/A] % Reduction in Area: [39:100.00%] [41:100.00%] [N/A:N/A] % Reduction in Volume: [39:100.00% Partial Thickness] [41:100.00% Grade 1] [N/A:N/A N/A] Treatment Notes Electronic Signature(s) Signed: 05/19/2019 1:06:39 PM By: Linton Ham  MD Signed: 05/19/2019 5:41:48 PM By: Deon Pilling Entered By: Linton Ham on 05/19/2019 07:27:14 -------------------------------------------------------------------------------- Multi-Disciplinary Care Plan Details Patient Name: Date of Service: ITALIA, BROWNBACK 05/18/2019 11:30 AM Medical Record [id number] Patient Account Number: [id number] Date of Birth/Sex: Treating RN: 1945-07-14 (74 y.o. Debby Bud Primary Care Xai Frerking: [name] Other Clinician: Referring Venise Ellingwood: Treating Ismar Yabut/Extender:Robson, Tenna Child, [place] Siren in Treatment: 18 Active Inactive Electronic Signature(s) Signed: 05/18/2019 5:57:11 PM By: Deon Pilling Entered By: Deon Pilling on 05/18/2019 12:28:32 --------------------------------------------------------------------------------  Pain Assessment Details Patient Name: Date of Service: SEVEN, CHALLA 05/18/2019 11:30 AM Medical Record [id number] Patient Account Number: [id number] Date of Birth/Sex: Treating RN: 1945-05-22 (74 y.o. Debby Bud Primary Care Jondavid Schreier: Other Clinician: [name] Referring Jishnu Jenniges: Treating Yvette Loveless/Extender:Robson, Tenna Child, [place] Siren in Treatment: 18 Active Problems Location of Pain Severity and Description of Pain Patient Has Paino No Site Locations Pain Management and Medication Current Pain Management: Electronic Signature(s) Signed: 05/18/2019 5:57:11 PM By: Deon Pilling Signed: 07/19/2019 9:23:26 AM By: Sandre Kitty Entered By: Sandre Kitty on 05/18/2019 11:58:14 -------------------------------------------------------------------------------- Patient/Caregiver Education Details Patient Name: Date of Service: SUNNA, SHAMOUN 2/11/2021andnbsp11:30 AM Medical Record [id number] Patient Account Number: [id number] Date of Birth/Gender: Treating RN: [birth year] (74 y.o. Debby Bud Primary Care Physician: [name] Other Clinician: Referring Physician: Treating Physician/Extender:Robson, Tenna Child, [place] Siren in Treatment: 18 Education Assessment Education Provided To: Patient Education Topics Provided Wound/Skin Impairment: Handouts: Skin Care Do's and Dont's Methods: Explain/Verbal Responses: Reinforcements needed Electronic Signature(s) Signed: 05/18/2019 5:57:11 PM By: Deon Pilling Entered By: Deon Pilling on 05/18/2019 12:28:50 -------------------------------------------------------------------------------- Wound Assessment Details Patient Name: Date of Service: EMBREE, HAHNER 05/18/2019 11:30 AM Medical Record [id number] Patient Account Number: [id number] Date of Birth/Sex: Treating RN: 25-Nov-1945 (74 y.o. Helene Shoe, Tammi Klippel Primary Care Johnathan Tortorelli: [name] Other Clinician: Referring Taneya Conkel: Treating Keondria Siever/Extender:Robson, Tenna Child, [place] Siren in Treatment: 18 Wound Status Wound Number: [id number] Primary Etiology: Lymphedema Wound Location: Left, Anterior Lower Leg Wound Status: Open Wounding Event: Gradually Appeared Date Acquired: 04/11/2019 Weeks Of Treatment: 5 Clustered Wound: No Wound Measurements Length: (cm) Width: (cm) Depth: (cm) Area: (cm) Volume: (cm) Wound Description Classification: Partial Thickness 0 % Reduction in Area: 100% 0 % Reduction in Volume: 100% 0 0 0 Electronic Signature(s) Signed: 05/18/2019 5:57:11 PM By: Deon Pilling Signed: 07/19/2019 9:23:26 AM By: Sandre Kitty Entered By: Sandre Kitty on 05/18/2019 12:12:07 -------------------------------------------------------------------------------- Wound Assessment Details Patient Name: Date of Service: Weckwerth, Dreanna 05/18/2019 11:30 AM Medical Record [id number] Patient Account Number: [id number] Date of Birth/Sex: Treating RN: 09-Jul-1945 (73 y.o. Helene Shoe, Tammi Klippel Primary Care Prerana Strayer: [name] Other  Clinician: Referring Aneshia Jacquet: Treating Krystin Keeven/Extender:Robson, Tenna Child, [place] Siren in Treatment: 18 Wound Status Wound Number: 41 Primary Diabetic Wound/Ulcer of the Lower Etiology: Extremity Wound Location: Left, Plantar Toe Second Wound Status: Open Wounding Event: Gradually Appeared Date Acquired: 04/07/2019 Weeks Of Treatment: 1 Clustered Wound: No Wound Measurements Length: (cm) 0 % Reduct Width: (cm) 0 % Reduct Depth: (cm) 0 Area: (cm) 0 Volume: (cm) 0 Wound Description Classification: Grade 1 ion in Area: 100% ion in Volume: 100% Electronic Signature(s) Signed: 05/18/2019 5:57:11 PM By: Deon Pilling Signed: 07/19/2019 9:23:26 AM By: Sandre Kitty Entered By: Sandre Kitty on 05/18/2019 12:12:07 -------------------------------------------------------------------------------- Vitals Details Patient Name: Date of Service: Minion, Kathe 05/18/2019 11:30 AM Medical Record [id number] Patient Account Number: [id number] Date of Birth/Sex: Treating RN: 11-Sep-1945 (74 y.o. Helene Shoe, Tammi Klippel Primary Care Balen Woolum: [name] Other Clinician: Referring Morgaine Kimball: Treating Gizel Riedlinger/Extender:Robson, Tenna Child, [place] Siren in Treatment: 18 Vital Signs Time Taken: 11:55 Temperature (F): 98.5 Height (in): 71 Pulse (bpm): 61 Weight (lbs): 240 Respiratory Rate (breaths/min): 18 Body Mass Index (BMI): 33.5 Blood Pressure (mmHg): 144/64 Reference Range: 80 - 120 mg / dl Electronic Signature(s) Signed: 07/19/2019 9:23:26 AM By: Sandre Kitty Entered By: Sandre Kitty on 05/18/2019 11:58:04

## 2019-07-20 ENCOUNTER — Ambulatory Visit: Payer: PPO

## 2019-07-20 ENCOUNTER — Other Ambulatory Visit: Payer: Self-pay

## 2019-07-20 DIAGNOSIS — I890 Lymphedema, not elsewhere classified: Secondary | ICD-10-CM | POA: Diagnosis not present

## 2019-07-20 DIAGNOSIS — R2689 Other abnormalities of gait and mobility: Secondary | ICD-10-CM

## 2019-07-20 NOTE — Therapy (Signed)
[place], [place], [address] Phone: [phone]   Fax:  [phone]  Physical Therapy Progress Note  Progress Note Reporting Period 06/15/2019 to 07/20/2019  See note below for Objective Data and Assessment of Progress/Goals.       Patient Details  Name: [name] [name] MRN: [id number] Date of Birth: 1946/04/01 Referring Provider (PT): Darci Current   Encounter Date: 07/20/2019  PT End of Session - 07/20/19 1306    Visit Number  35    Number of Visits  45    Date for PT Re-Evaluation  08/31/19    PT Start Time  1304    PT Stop Time  1400    PT Time Calculation (min)  56 min    Activity Tolerance  Patient tolerated treatment well    Behavior During Therapy  [facility] for tasks assessed/performed       Past Medical History:  Diagnosis Date  . Acute kidney injury ([place])   . Aortic stenosis, mild 11/17/2013  . Arthritis    "both knees, spine, back, fingers" (11/29/2013)  . CHF (congestive heart failure) ([place])   . Edema   . GERD (gastroesophageal reflux disease)   . Heart murmur   . History of diastolic dysfunction    Echo [id number] with diastolic dysfunction  . HTN (hypertension)   . Morbid obesity ([place])   . OA (osteoarthritis)   . PAC (premature atrial contraction)    per prior Holter  . Palpitations   . Pneumonia    "as a child"  . PVC's (premature ventricular contractions)    per prior Holter  . SVT (supraventricular tachycardia) ([place])   . Tachycardia    noted at 07/28/11 visit. Started on beta blocker. Possible atrial flutter vs long PR tachycardia/AVNRT  . Type II diabetes mellitus ([place])     Past Surgical History:  Procedure Laterality Date  . CESAREAN SECTION  1985  . CESAREAN SECTION  1985  . SUPRAVENTRICULAR TACHYCARDIA ABLATION  11/29/2013  . SUPRAVENTRICULAR TACHYCARDIA ABLATION N/A 11/29/2013   Procedure: SUPRAVENTRICULAR TACHYCARDIA ABLATION;  Surgeon: Evans Lance, MD;   Location[facility] CATH LAB;  Service: Cardiovascular;  Laterality: N/A;  . US ECHOCARDIOGRAPHY  07/25/2008   EF 55-60%  . VAGINAL DELIVERY      There were no vitals filed for this visit.  Subjective Assessment - 07/20/19 1307    Subjective  Pt states that she had to miss her appointment last week becuase she wasn't going to be able to get the bus to come back for her and then this week due to Covid vasccination.    Pertinent History  osteo arthritis, hx cellulitis, phlebitis and history of edema in BLE, Hx Acute kidney injury, CHF and DM II    Currently in Pain?  No/denies    Pain Score  0-No pain                       [facility] Adult PT Treatment/Exercise - 07/20/19 0001      Manual Therapy   Manual Therapy  Compression Bandaging;Edema management    Manual therapy comments  Pt lower legs were dressed with allevyn on the lateral lower legs, posterior R lower leg and anterior R lower leg near the ankle, covered with ABD pads and held in place with Lenkelast after her Bil LE were washed with soap and warm water then rinsed and lotion applied only to non-open areas.     Compression Bandaging  No toe wraps, Double tubigrip used at Bil thighs and single layer at BIl lower legs, 2 artiflex used then 3 short stretch bandages from foot to below the knee after cleansing and dressing legs.              PT Education - 07/20/19 1401    Education Details  Re-iterated education on the importance of not leaving wraps on for more than 2-3 days. Pt was shown areas on her lower leg were the skin had macerated and left top layer skin loss due to fluid leaking and maceration of the skin. Discussed that she needs to remove wraps and wear tubigrip and practice donning tubigrip on her lower legs so that we know what she will be able to don/doff at home.    Person(s) Educated  Patient    Methods  Explanation    Comprehension  Verbalized understanding       PT Short Term Goals - 07/20/19 1412       PT SHORT TERM GOAL #1   Title  Pt will be independent with HEP    Baseline  Pt is working on her HEP occasionally at home.    Status  On-going    Target Date  08/24/19        PT Long Term Goals - 07/20/19 1412      PT LONG TERM GOAL #1   Title  Patient will have reduction of L lower leg limb girth by 3 cm    Baseline  measurements not taken today time spent dressing legs.    Status  On-going    Target Date  08/24/19      PT LONG TERM GOAL #2   Title  Patient to be properly fitted with compression garment to wear on daily basis.    Baseline  pt has not been able to wear due to skin integrity issues and missed appointments related to transportation and covid vaccination    Status  On-going    Target Date  08/24/19      PT LONG TERM GOAL #3   Title  Patient will be independent in self-care management principles including self-massage/bandaging and long term management plan for edema.    Baseline  Pt continues to struggle with managing her lymphedema.    Status  On-going    Target Date  08/24/19            Plan - 07/20/19 1306    Clinical Impression Statement  Pt presents to physical therapy with her wraps still in place from her last visit 2 weeks ago. When wraps were  moved pt had had weeping that macerated skin and removed the top layer in delicate areas on the Bil lower legs. No sanguinous drainage or signs/symptoms of infection noted this session. Odor was present but not consistent with infection most likely related to pt weeping and sloughed skin. Sloughed skin was present over lateral bil lower legs and then the posterior and anterior R lower leg in quarter sized areas. Macerated skin present were skin was sloughed. Skin was cleaned with sloughed skin wiped off and then rinsed. Lotion was applied to dry areas, Allevyn was then applied followed by ABD pads and wrap. Extra time was spent today on the importance of removing wraps when she has to miss an appointment. I have  discussed this in the past with patient and she states understanding. She stated understanding today and stated she was frusterated with her legs. When her legs are  not wrapped they split open and it results in wounds/infection. Wrapping makes it difficult for her to move but keeps her more comfortable overall. We have been trying to contact a Education officer, museum and pt was able to set up a meeting with them tomorrow for a home visit to hopefully help pay for garments/assistance at home. Pt will benefit from continued POC at this time 2x/week for 5 weeks in order to decrease risk for infection/swelling and wounds resulting in hospitalization or immobility.    Personal Factors and Comorbidities  Age;Comorbidity 3+    Comorbidities  CHF, acute kidney injury, DM II    Rehab Potential  Good    PT Frequency  2x / week    PT Duration  Other (comment)   5 weeks   PT Treatment/Interventions  Electrical Stimulation;Cryotherapy;Gait training;Stair training;Functional mobility training;Therapeutic activities;Therapeutic exercise;Balance training;Neuromuscular re-education;Patient/family education;Manual lymph drainage;Compression bandaging;Passive range of motion;Taping;Vasopneumatic Device    PT Next Visit Plan  Continue with wrapping, help order garments if needed. Ask if she has spoke to Education officer, museum?    PT Home Exercise Plan  pump and exercises    Consulted and Agree with Plan of Care  Patient       Patient will benefit from skilled therapeutic intervention in order to improve the following deficits and impairments:  Difficulty walking, Increased edema  Visit Diagnosis: Lymphedema  Other abnormalities of gait and mobility     Problem List Patient Active Problem List   Diagnosis Date Noted  . Postnasal drip 11/16/2017  . Sensorineural hearing loss (SNHL), bilateral 11/16/2017  . Tinnitus of both ears 11/16/2017  . Obesity, Class II, BMI 35-39.9, isolated (see actual BMI) 11/08/2017  . CKD (chronic  kidney disease), stage III 11/08/2017  . Anemia 11/07/2017  . Thrombocytosis ([place]) 11/07/2017  . Lymphedema 11/07/2017  . Routine general medical examination at a health care facility 05/10/2014  . Paroxysmal SVT (supraventricular tachycardia) ([place]) 11/28/2013  . Physical deconditioning 11/23/2013  . Generalized weakness 11/17/2013  . Nausea 11/17/2013  . Multilevel spine pain 11/17/2013  . Aortic stenosis, mild 11/17/2013  . DM type 2 (diabetes mellitus, type 2) ([place]) 02/02/2013  . Arthritis 10/19/2012  . Mixed incontinence 10/19/2012  . GERD (gastroesophageal reflux disease) 04/20/2011  . HTN (hypertension) 01/20/2011  . Morbid obesity [facility])     Ander Purpura, PT 07/20/2019, 2:14 PM  [place], [place], [address] Phone: [phone]   Fax:  [phone]  Name: Maley Bible MRN: [id number] Date of Birth: 09/14/1945

## 2019-07-25 ENCOUNTER — Ambulatory Visit: Payer: PPO

## 2019-07-25 DIAGNOSIS — I890 Lymphedema, not elsewhere classified: Secondary | ICD-10-CM | POA: Diagnosis not present

## 2019-07-25 DIAGNOSIS — R2689 Other abnormalities of gait and mobility: Secondary | ICD-10-CM

## 2019-07-25 NOTE — Therapy (Signed)
[place], [place], [address] Phone: [phone]   Fax:  [phone]  Physical Therapy Treatment  Patient Details  Name: [name] [name] MRN: [id number] Date of Birth: July 31, 1945 Referring Provider (PT): Darci Current   Encounter Date: 07/25/2019  PT End of Session - 07/25/19 1304    Visit Number  36    Number of Visits  45    Date for PT Re-Evaluation  08/31/19    PT Start Time  [id number]    PT Stop Time  1348    PT Time Calculation (min)  43 min    Activity Tolerance  Patient tolerated treatment well    Behavior During Therapy  [facility] for tasks assessed/performed       Past Medical History:  Diagnosis Date  . Acute kidney injury ([place])   . Aortic stenosis, mild 11/17/2013  . Arthritis    "both knees, spine, back, fingers" (11/29/2013)  . CHF (congestive heart failure) ([place])   . Edema   . GERD (gastroesophageal reflux disease)   . Heart murmur   . History of diastolic dysfunction    Echo [id number] with diastolic dysfunction  . HTN (hypertension)   . Morbid obesity ([place])   . OA (osteoarthritis)   . PAC (premature atrial contraction)    per prior Holter  . Palpitations   . Pneumonia    "as a child"  . PVC's (premature ventricular contractions)    per prior Holter  . SVT (supraventricular tachycardia) ([place])   . Tachycardia    noted at 07/28/11 visit. Started on beta blocker. Possible atrial flutter vs long PR tachycardia/AVNRT  . Type II diabetes mellitus ([name])     Past Surgical History:  Procedure Laterality Date  . CESAREAN SECTION  1985  . CESAREAN SECTION  1985  . SUPRAVENTRICULAR TACHYCARDIA ABLATION  11/29/2013  . SUPRAVENTRICULAR TACHYCARDIA ABLATION N/A 11/29/2013   Procedure: SUPRAVENTRICULAR TACHYCARDIA ABLATION;  Surgeon: Evans Lance, MD;  Location: [facility] CATH LAB;  Service: Cardiovascular;  Laterality: N/A;  . US ECHOCARDIOGRAPHY  07/25/2008   EF 55-60%  . VAGINAL DELIVERY       There were no vitals filed for this visit.  Subjective Assessment - 07/25/19 1357    Subjective  Pt states that she saw her social worker last week but she had to leave early due to her computer died. She was suppose to call her on Monday but did not.    Pertinent History  osteo arthritis, hx cellulitis, phlebitis and history of edema in BLE, Hx Acute kidney injury, CHF and DM II    Patient Stated Goals  I want to get this swelling down and heal my wound.    Currently in Pain?  No/denies    Pain Score  0-No pain                       [facility] Adult PT Treatment/Exercise - 07/25/19 0001      Manual Therapy   Manual Therapy  Compression Bandaging;Edema management    Manual therapy comments  L lower leg was covered with 3 ABD pads over the lateral aspect and 2 ABD pads over the R lower lateral/posterior leg. Kerlex used to hold in place.     Compression Bandaging  No toe wraps, Single tubigrip used at Bil thighs and single layer thin non-elastic  at BIl lower legs, 2 artiflex used then 3 short stretch bandages from foot to below the knee on  the R and  short stretch used on the L. after cleansing and dressing legs.              PT Education - 07/25/19 1359    Education Details  Educated pt that if her L lower leg does not start to granulate and dry out she will need a referral to the wound clinic due to maceration of the skin and this clinic does ont have ultra absorbent dressings that can be used. No signs or symptoms of infection at this time. Due to leakage of lymphatic fluid there is an odor that is not consistent with any specific type of bacterial odor and is conssitent with the odor thatis regularly associated with fluid leakage at her lower legs.    Person(s) Educated  Patient    Methods  Explanation    Comprehension  Verbalized understanding       PT Short Term Goals - 07/20/19 1412      PT SHORT TERM GOAL #1   Title  Pt will be independent with HEP     Baseline  Pt is working on her HEP occasionally at home.    Status  On-going    Target Date  08/24/19        PT Long Term Goals - 07/20/19 1412      PT LONG TERM GOAL #1   Title  Patient will have reduction of L lower leg limb girth by 3 cm    Baseline  measurements not taken today time spent dressing legs.    Status  On-going    Target Date  08/24/19      PT LONG TERM GOAL #2   Title  Patient to be properly fitted with compression garment to wear on daily basis.    Baseline  pt has not been able to wear due to skin integrity issues and missed appointments related to transportation and covid vaccination    Status  On-going    Target Date  08/24/19      PT LONG TERM GOAL #3   Title  Patient will be independent in self-care management principles including self-massage/bandaging and long term management plan for edema.    Baseline  Pt continues to struggle with managing her lymphedema.    Status  On-going    Target Date  08/24/19            Plan - 07/25/19 1304    Clinical Impression Statement  Pt presents to physical therapy with her wraps in place from her last visit. She continues with macerated skin on the L lateral lower leg with weeping but less exudate noted than last session (though she has had this wrap on for much less time). No sanguinous drainage or signs/symptoms of infection noted this session. Ordor continues but is not consistent with infection and continues to smell as it did prior to this episode when pt would have weeping. Sloughed skin continues to be present on the L lateral lower leg but has improved significantly on the R lateral/posterior/anterior lower leg. Skin was cleaned with soap and warm water, lotion applied to dry areas avoiding macerated areas, ABD pads were used this session with extra on the L lower leg to help dry this area to facilitate healing and contain odor. Re-iterated education on the importance of removing wraps when pt has to miss an  appointment. Pt was able to see a Education officer, museum last week but the visit was cut short due to the social workers  computer battery died and pt stated the social worker was suppose to contact her Monday but did not. Pt social work company was notified today of what happened last week through e-mail by this physical therapist. Pt will benefit from continued POC at this time in order to decrease risk for infection/swelling and wounds resulting in hospitalization or immobility.    Personal Factors and Comorbidities  Age;Comorbidity 3+    Comorbidities  CHF, acute kidney injury, DM II    Rehab Potential  Good    PT Frequency  2x / week    PT Duration  Other (comment)    PT Treatment/Interventions  Electrical Stimulation;Cryotherapy;Gait training;Stair training;Functional mobility training;Therapeutic activities;Therapeutic exercise;Balance training;Neuromuscular re-education;Patient/family education;Manual lymph drainage;Compression bandaging;Passive range of motion;Taping;Vasopneumatic Device    PT Next Visit Plan  Continue with wrapping, help order garments if needed. Ask if she has spoke to Education officer, museum?    PT Home Exercise Plan  pump and exercises    Consulted and Agree with Plan of Care  Patient       Patient will benefit from skilled therapeutic intervention in order to improve the following deficits and impairments:  Difficulty walking, Increased edema  Visit Diagnosis: Lymphedema  Other abnormalities of gait and mobility     Problem List Patient Active Problem List   Diagnosis Date Noted  . Postnasal drip 11/16/2017  . Sensorineural hearing loss (SNHL), bilateral 11/16/2017  . Tinnitus of both ears 11/16/2017  . Obesity, Class II, BMI 35-39.9, isolated (see actual BMI) 11/08/2017  . CKD (chronic kidney disease), stage III 11/08/2017  . Anemia 11/07/2017  . Thrombocytosis ([place]) 11/07/2017  . Lymphedema 11/07/2017  . Routine general medical examination at a health care facility  05/10/2014  . Paroxysmal SVT (supraventricular tachycardia) ([place]) 11/28/2013  . Physical deconditioning 11/23/2013  . Generalized weakness 11/17/2013  . Nausea 11/17/2013  . Multilevel spine pain 11/17/2013  . Aortic stenosis, mild 11/17/2013  . DM type 2 (diabetes mellitus, type 2) ([place]) 02/02/2013  . Arthritis 10/19/2012  . Mixed incontinence 10/19/2012  . GERD (gastroesophageal reflux disease) 04/20/2011  . HTN (hypertension) 01/20/2011  . Morbid obesity ([place])     Ander Purpura, PT 07/25/2019, 2:06 PM  [place], [place], [address] Phone: [phone]   Fax:  [phone]  Name: Elizia Paolo MRN: [id number] Date of Birth: [birth year]

## 2019-07-27 ENCOUNTER — Ambulatory Visit: Payer: PPO

## 2019-07-28 ENCOUNTER — Other Ambulatory Visit: Payer: Self-pay

## 2019-07-28 ENCOUNTER — Encounter: Payer: Self-pay | Admitting: Internal Medicine

## 2019-07-28 ENCOUNTER — Ambulatory Visit (INDEPENDENT_AMBULATORY_CARE_PROVIDER_SITE_OTHER): Payer: PPO | Admitting: Internal Medicine

## 2019-07-28 VITALS — BP 138/74 | HR 80 | Temp 98.0°F | Ht 71.0 in | Wt 310.2 lb

## 2019-07-28 DIAGNOSIS — I890 Lymphedema, not elsewhere classified: Secondary | ICD-10-CM

## 2019-07-28 DIAGNOSIS — J3089 Other allergic rhinitis: Secondary | ICD-10-CM | POA: Diagnosis not present

## 2019-07-28 DIAGNOSIS — D649 Anemia, unspecified: Secondary | ICD-10-CM | POA: Diagnosis not present

## 2019-07-28 DIAGNOSIS — J309 Allergic rhinitis, unspecified: Secondary | ICD-10-CM | POA: Insufficient documentation

## 2019-07-28 LAB — CBC
HCT: 33.3 % — ABNORMAL LOW (ref 36.0–46.0)
Hemoglobin: 10.8 g/dL — ABNORMAL LOW (ref 12.0–15.0)
MCHC: 32.3 g/dL (ref 30.0–36.0)
MCV: 76.2 fl — ABNORMAL LOW (ref 78.0–100.0)
Platelets: 258 10*3/uL (ref 150.0–400.0)
RBC: 4.38 Mil/uL (ref 3.87–5.11)
RDW: 19.3 % — ABNORMAL HIGH (ref 11.5–15.5)
WBC: 4.6 10*3/uL (ref 4.0–10.5)

## 2019-07-28 LAB — FERRITIN: Ferritin: 18.6 ng/mL (ref 10.0–291.0)

## 2019-07-28 MED ORDER — MONTELUKAST SODIUM 10 MG PO TABS: 10.0000 mg | ORAL_TABLET | Freq: Every day | ORAL | 3 refills | Status: DC

## 2019-07-28 MED ORDER — CELECOXIB 100 MG PO CAPS: 100.0000 mg | ORAL_CAPSULE | Freq: Two times a day (BID) | ORAL | 1 refills | Status: DC

## 2019-07-28 MED ORDER — JUVEN PO PACK: 1.0000 | PACK | Freq: Two times a day (BID) | ORAL | 3 refills | Status: DC

## 2019-07-28 MED ORDER — ENSURE COMPLETE PO LIQD: 237.0000 mL | Freq: Two times a day (BID) | ORAL | 11 refills | Status: DC

## 2019-07-28 NOTE — Assessment & Plan Note (Signed)
Rx singulair to try instead of zyrtec for allergies.

## 2019-07-28 NOTE — Progress Notes (Signed)
   Subjective:   Patient ID: [name] [name], female    DOB: 06/18/1945, 74 y.o.   MRN: [id number]  HPI The patient is a 74 YO female coming in for concerns about arthritis (pain in her legs with the swelling and some arthritis, some pain in the shoulders as well, previously had tried celebrex for a short time and this made a good difference in her mobility) and allergies (taking zyrtec otc, used to take singulair and we had changed, she would like to resume this to see if it helps better, some eye itching and burning, throat drainage, denies fevers or chills, denies cough or SOB) and low blood counts (she has not been taking iron as recommended, denies blood in stool or dark stool, denies lightheadedness).   Review of Systems  Constitutional: Negative.   HENT: Positive for congestion and postnasal drip.   Eyes: Positive for itching.  Respiratory: Negative for cough, chest tightness and shortness of breath.   Cardiovascular: Positive for leg swelling. Negative for chest pain and palpitations.  Gastrointestinal: Negative for abdominal distention, abdominal pain, constipation, diarrhea, nausea and vomiting.  Musculoskeletal: Positive for arthralgias and myalgias.  Skin: Positive for wound.  Neurological: Negative.   Psychiatric/Behavioral: Negative.     Objective:  Physical Exam Constitutional:      Appearance: She is well-developed.  HENT:     Head: Normocephalic and atraumatic.     Nose:     Comments: Oropharynx with redness and clear drainage Cardiovascular:     Rate and Rhythm: Normal rate and regular rhythm.  Pulmonary:     Effort: Pulmonary effort is normal. No respiratory distress.     Breath sounds: Normal breath sounds. No wheezing or rales.  Abdominal:     General: Bowel sounds are normal. There is no distension.     Palpations: Abdomen is soft.     Tenderness: There is no abdominal tenderness. There is no rebound.  Musculoskeletal:     Cervical back: Normal range of  motion.     Right lower leg: Edema present.     Left lower leg: Edema present.     Comments: Stable lymphedema bilateral legs  Skin:    General: Skin is warm and dry.  Neurological:     Mental Status: She is alert and oriented to person, place, and time.     Coordination: Coordination abnormal.     Comments: Walker for ambulation     Vitals:   07/28/19 1043  BP: 138/74  Pulse: 80  Temp: 98 F (36.7 C)  SpO2: 99%  Weight: (!) 310 lb 3.2 oz (140.7 kg)  Height: 5\' 11"  (1.803 m)    This visit occurred during the SARS-CoV-2 public health emergency.  Safety protocols were in place, including screening questions prior to the visit, additional usage of staff PPE, and extensive cleaning of exam room while observing appropriate contact time as indicated for disinfecting solutions.   Assessment & Plan:

## 2019-07-28 NOTE — Patient Instructions (Signed)
We have sent in the juven and the ensure.   We have sent in celebrex to try for the arthritis.   We have sent in singulair (montelukast) for the allergies to try instead of zyrtec.

## 2019-07-28 NOTE — Assessment & Plan Note (Signed)
Rx celebrex for pain to use prn. Rx juven and ensure to help with wound healing for her lymphedema

## 2019-07-28 NOTE — Assessment & Plan Note (Signed)
Needs CBC and ferritin recheck today. Has not been taking iron supplementation.

## 2019-08-01 ENCOUNTER — Ambulatory Visit: Payer: PPO

## 2019-08-01 ENCOUNTER — Other Ambulatory Visit: Payer: Self-pay

## 2019-08-01 DIAGNOSIS — I890 Lymphedema, not elsewhere classified: Secondary | ICD-10-CM | POA: Diagnosis not present

## 2019-08-01 DIAGNOSIS — R2689 Other abnormalities of gait and mobility: Secondary | ICD-10-CM

## 2019-08-01 NOTE — Therapy (Signed)
[place], [place], [address] Phone: [phone]   Fax:  [phone]  Physical Therapy Treatment  Patient Details  Name: [name] [name] MRN: [id number] Date of Birth: 1945/09/17 Referring Provider (PT): Darci Current   Encounter Date: 08/01/2019  PT End of Session - 08/01/19 1300    Visit Number  37    Number of Visits  45    Date for PT Re-Evaluation  08/31/19    PT Start Time  [id number]    PT Stop Time  1400    PT Time Calculation (min)  61 min    Activity Tolerance  Patient tolerated treatment well    Behavior During Therapy  [facility] for tasks assessed/performed       Past Medical History:  Diagnosis Date  . Acute kidney injury ([place])   . Aortic stenosis, mild 11/17/2013  . Arthritis    "both knees, spine, back, fingers" (11/29/2013)  . CHF (congestive heart failure) ([place])   . Edema   . GERD (gastroesophageal reflux disease)   . Heart murmur   . History of diastolic dysfunction    Echo [id number] with diastolic dysfunction  . HTN (hypertension)   . Morbid obesity ([place])   . OA (osteoarthritis)   . PAC (premature atrial contraction)    per prior Holter  . Palpitations   . Pneumonia    "as a child"  . PVC's (premature ventricular contractions)    per prior Holter  . SVT (supraventricular tachycardia) ([place])   . Tachycardia    noted at 07/28/11 visit. Started on beta blocker. Possible atrial flutter vs long PR tachycardia/AVNRT  . Type II diabetes mellitus ([place])     Past Surgical History:  Procedure Laterality Date  . CESAREAN SECTION  1985  . CESAREAN SECTION  1985  . SUPRAVENTRICULAR TACHYCARDIA ABLATION  11/29/2013  . SUPRAVENTRICULAR TACHYCARDIA ABLATION N/A 11/29/2013   Procedure: SUPRAVENTRICULAR TACHYCARDIA ABLATION;  Surgeon: Evans Lance, MD;  Location: [place] CATH LAB;  Service: Cardiovascular;  Laterality: N/A;  . US ECHOCARDIOGRAPHY  07/25/2008   EF 55-60%  . VAGINAL DELIVERY       There were no vitals filed for this visit.  Subjective Assessment - 08/01/19 1300    Subjective  Pt states that the social worker called her yesterday and she called back but has not been able to reach her yet. She states that she saw her PCP and she did not seem concerned about her legs. Pt missed her appointment last week due to another appointment.    Pertinent History  osteo arthritis, hx cellulitis, phlebitis and history of edema in BLE, Hx Acute kidney injury, CHF and DM II    Patient Stated Goals  I want to get this swelling down and heal my wound.                       [facility] Adult PT Treatment/Exercise - 08/01/19 0001      Manual Therapy   Manual Therapy  Compression Bandaging;Edema management    Manual therapy comments  allevyn was used over the L lateral lower/posterior and anterior leg, small pieces of allevyn were used over the R lower leg where pt continues with open areas. No signs/syptoms of infection. She continues with macerated skin, multiple layers of ABD pads were used and kerlex for holding in place.     Compression Bandaging  No toe wraps, double tubigrip used at Bil thighs and double at the  L lower leg, single layer tubigrip  at R lower legs, 2 artiflex used then 4 short stretch bandages from foot to below the knee on the R and  short stretch used on the L. after cleansing and dressing legs.              PT Education - 08/01/19 1718    Education Details  Pt will continue with her pump, elevation and wearing her compression at home. she was educated that if she misses her appointment on Thursday or if she has any further degradation of the skin she will need to be referred to the wound clinic due to they have more absorbent material that will help dry out her skin to help it heal and decresae maceration.    Person(s) Educated  Patient    Methods  Explanation    Comprehension  Verbalized understanding       PT Short Term Goals - 07/20/19 1412       PT SHORT TERM GOAL #1   Title  Pt will be independent with HEP    Baseline  Pt is working on her HEP occasionally at home.    Status  On-going    Target Date  08/24/19        PT Long Term Goals - 07/20/19 1412      PT LONG TERM GOAL #1   Title  Patient will have reduction of L lower leg limb girth by 3 cm    Baseline  measurements not taken today time spent dressing legs.    Status  On-going    Target Date  08/24/19      PT LONG TERM GOAL #2   Title  Patient to be properly fitted with compression garment to wear on daily basis.    Baseline  pt has not been able to wear due to skin integrity issues and missed appointments related to transportation and covid vaccination    Status  On-going    Target Date  08/24/19      PT LONG TERM GOAL #3   Title  Patient will be independent in self-care management principles including self-massage/bandaging and long term management plan for edema.    Baseline  Pt continues to struggle with managing her lymphedema.    Status  On-going    Target Date  08/24/19            Plan - 08/01/19 1300    Clinical Impression Statement  Pt presents to physical therapy with her wraps still in place from her last visit; though they had slid about half way down her lower leg. She continues with macerated skin on the L lateral/posterior lower leg with continued weeping and skin slough. no sanguinous drainage is present but pt has copious amounts of serous drainge continuing no signs of infection noted this session. Same odor that has been present continues. She continues with small open areas on the R lower leg with serous drainage. Extra foam/cushioning and layers were added to her lower leg wraps today. She was educated that due to the materials available at this clinic if this wrap is unable to maintain her fluid she will need to be referred back to the wound clinic for a more absorbent dressing to help her skin heal. Pt will benefit from continued POC at  this time. If her skin is the same or worse next session or if she has to miss another session she will be referred to the wound clinic for more  absorbent dressings.    Personal Factors and Comorbidities  Age;Comorbidity 3+    Comorbidities  CHF, acute kidney injury, DM II    Rehab Potential  Good    PT Frequency  2x / week    PT Duration  Other (comment)    PT Treatment/Interventions  Electrical Stimulation;Cryotherapy;Gait training;Stair training;Functional mobility training;Therapeutic activities;Therapeutic exercise;Balance training;Neuromuscular re-education;Patient/family education;Manual lymph drainage;Compression bandaging;Passive range of motion;Taping;Vasopneumatic Device    PT Next Visit Plan  Continue with wrapping, help order garments if needed. Ask if she has spoke to Education officer, museum?    PT Home Exercise Plan  pump and exercises    Consulted and Agree with Plan of Care  Patient       Patient will benefit from skilled therapeutic intervention in order to improve the following deficits and impairments:  Difficulty walking, Increased edema  Visit Diagnosis: Lymphedema  Other abnormalities of gait and mobility     Problem List Patient Active Problem List   Diagnosis Date Noted  . Allergic rhinitis 07/28/2019  . Postnasal drip 11/16/2017  . Sensorineural hearing loss (SNHL), bilateral 11/16/2017  . Tinnitus of both ears 11/16/2017  . Obesity, Class II, BMI 35-39.9, isolated (see actual BMI) 11/08/2017  . CKD (chronic kidney disease), stage III 11/08/2017  . Anemia 11/07/2017  . Thrombocytosis ([place]) 11/07/2017  . Lymphedema 11/07/2017  . Routine general medical examination at a health care facility 05/10/2014  . Paroxysmal SVT (supraventricular tachycardia) ([place]) 11/28/2013  . Physical deconditioning 11/23/2013  . Generalized weakness 11/17/2013  . Nausea 11/17/2013  . Multilevel spine pain 11/17/2013  . Aortic stenosis, mild 11/17/2013  . DM type 2 (diabetes mellitus,  type 2) ([place]) 02/02/2013  . Arthritis 10/19/2012  . Mixed incontinence 10/19/2012  . GERD (gastroesophageal reflux disease) 04/20/2011  . HTN (hypertension) 01/20/2011  . Morbid obesity [facility])     Ander Purpura, PT 08/01/2019, 5:24 PM  [place], [place], [address] Phone: [phone]   Fax:  [phone]  Name: [name] [name] MRN: [id number] Date of Birth: May 24, 1945

## 2019-08-03 ENCOUNTER — Other Ambulatory Visit: Payer: Self-pay

## 2019-08-03 ENCOUNTER — Ambulatory Visit: Payer: PPO

## 2019-08-03 DIAGNOSIS — I890 Lymphedema, not elsewhere classified: Secondary | ICD-10-CM | POA: Diagnosis not present

## 2019-08-03 DIAGNOSIS — R2689 Other abnormalities of gait and mobility: Secondary | ICD-10-CM

## 2019-08-03 NOTE — Therapy (Signed)
[place], [place], [address] Phone: [phone]   Fax:  [phone]  Physical Therapy Treatment  Patient Details  Name: [name] [name] MRN: [id number] Date of Birth: 26-Feb-1946 Referring Provider (PT): Darci Current   Encounter Date: 08/03/2019  PT End of Session - 08/03/19 1306    Visit Number  38    Number of Visits  45    Date for PT Re-Evaluation  08/31/19    PT Start Time  [id number]    PT Stop Time  1400    PT Time Calculation (min)  55 min    Activity Tolerance  Patient tolerated treatment well    Behavior During Therapy  [facility] for tasks assessed/performed       Past Medical History:  Diagnosis Date  . Acute kidney injury ([place])   . Aortic stenosis, mild 11/17/2013  . Arthritis    "both knees, spine, back, fingers" (11/29/2013)  . CHF (congestive heart failure) ([place])   . Edema   . GERD (gastroesophageal reflux disease)   . Heart murmur   . History of diastolic dysfunction    Echo [id number] with diastolic dysfunction  . HTN (hypertension)   . Morbid obesity ([place])   . OA (osteoarthritis)   . PAC (premature atrial contraction)    per prior Holter  . Palpitations   . Pneumonia    "as a child"  . PVC's (premature ventricular contractions)    per prior Holter  . SVT (supraventricular tachycardia) ([place])   . Tachycardia    noted at 07/28/11 visit. Started on beta blocker. Possible atrial flutter vs long PR tachycardia/AVNRT  . Type II diabetes mellitus ([place])     Past Surgical History:  Procedure Laterality Date  . CESAREAN SECTION  1985  . CESAREAN SECTION  1985  . SUPRAVENTRICULAR TACHYCARDIA ABLATION  11/29/2013  . SUPRAVENTRICULAR TACHYCARDIA ABLATION N/A 11/29/2013   Procedure: SUPRAVENTRICULAR TACHYCARDIA ABLATION;  Surgeon: Evans Lance, MD;  Location: [facility] CATH LAB;  Service: Cardiovascular;  Laterality: N/A;  . US ECHOCARDIOGRAPHY  07/25/2008   EF 55-60%  . VAGINAL DELIVERY       There were no vitals filed for this visit.  Subjective Assessment - 08/03/19 1307    Subjective  Pt states that she pumped for 2 hours yesterday. She has not noticed any odor.    Pertinent History  osteo arthritis, hx cellulitis, phlebitis and history of edema in BLE, Hx Acute kidney injury, CHF and DM II    Patient Stated Goals  I want to get this swelling down and heal my wound.    Currently in Pain?  Yes    Pain Score  10-Worst pain ever    Pain Location  Knee    Pain Orientation  Right;Left    Pain Descriptors / Indicators  Aching    Pain Type  Chronic pain    Pain Onset  More than a month ago    Pain Frequency  Intermittent    Aggravating Factors   movement    Pain Relieving Factors  rest                       [facility] Adult PT Treatment/Exercise - 08/03/19 0001      Manual Therapy   Manual Therapy  Compression Bandaging;Edema management    Manual therapy comments  allevyn was used over the L lateral lower/posterior and anterior leg, small pieces of allevyn were used over the R lower  leg where pt continues with open areas. No signs/syptoms of infection. Multiple layers of ABD pads were used and kerlex for holding in place.     Compression Bandaging  No toe wraps, double tubigrip used at Bil thighs and double at the L lower leg, single layer tubigrip  at R lower legs, 2 artiflex used then 4 short stretch bandages from foot to below the knee on the R and 5 short stretch used on the L. after cleansing and dressing legs.              PT Education - 08/03/19 1428    Education Details  Pt will continue with her pump at home discussed performing for 1.5 hours, continueing to elevate and wear her compression. Discussed that due to significant improvement we will not refer to the wound center at this time.    Person(s) Educated  Patient    Methods  Explanation    Comprehension  Verbalized understanding       PT Short Term Goals - 07/20/19 1412      PT SHORT  TERM GOAL #1   Title  Pt will be independent with HEP    Baseline  Pt is working on her HEP occasionally at home.    Status  On-going    Target Date  08/24/19        PT Long Term Goals - 07/20/19 1412      PT LONG TERM GOAL #1   Title  Patient will have reduction of L lower leg limb girth by 3 cm    Baseline  measurements not taken today time spent dressing legs.    Status  On-going    Target Date  08/24/19      PT LONG TERM GOAL #2   Title  Patient to be properly fitted with compression garment to wear on daily basis.    Baseline  pt has not been able to wear due to skin integrity issues and missed appointments related to transportation and covid vaccination    Status  On-going    Target Date  08/24/19      PT LONG TERM GOAL #3   Title  Patient will be independent in self-care management principles including self-massage/bandaging and long term management plan for edema.    Baseline  Pt continues to struggle with managing her lymphedema.    Status  On-going    Target Date  08/24/19            Plan - 08/03/19 1306    Clinical Impression Statement  Pt presents this session with her wraps still in place from her last session. She has had significant improvement in her lower legs since her last session. No skin slough noted this session, significant decrease in serous drainage, odor has decreased moderately. She continues with small open areas on the R and L lower leg but they are improving significantly; pt will not be referred to the wound clinic due to materials being sufficient at this time. Continued with extra foam/cushioning and layers the the lower legs. Pt will benefit from continued POC at this time.    Personal Factors and Comorbidities  Age;Comorbidity 3+    Comorbidities  CHF, acute kidney injury, DM II    Rehab Potential  Good    PT Frequency  2x / week    PT Duration  Other (comment)    PT Treatment/Interventions  Electrical Stimulation;Cryotherapy;Gait  training;Stair training;Functional mobility training;Therapeutic activities;Therapeutic exercise;Balance training;Neuromuscular re-education;Patient/family education;Manual lymph drainage;Compression  bandaging;Passive range of motion;Taping;Vasopneumatic Device    PT Next Visit Plan  Continue with wrapping, help order garments if needed. Ask if she has spoke to Education officer, museum?    PT Home Exercise Plan  pump and exercises    Consulted and Agree with Plan of Care  Patient       Patient will benefit from skilled therapeutic intervention in order to improve the following deficits and impairments:  Difficulty walking, Increased edema  Visit Diagnosis: Lymphedema  Other abnormalities of gait and mobility     Problem List Patient Active Problem List   Diagnosis Date Noted  . Allergic rhinitis 07/28/2019  . Postnasal drip 11/16/2017  . Sensorineural hearing loss (SNHL), bilateral 11/16/2017  . Tinnitus of both ears 11/16/2017  . Obesity, Class II, BMI 35-39.9, isolated (see actual BMI) 11/08/2017  . CKD (chronic kidney disease), stage III 11/08/2017  . Anemia 11/07/2017  . Thrombocytosis ([place]) 11/07/2017  . Lymphedema 11/07/2017  . Routine general medical examination at a health care facility 05/10/2014  . Paroxysmal SVT (supraventricular tachycardia) ([place]) 11/28/2013  . Physical deconditioning 11/23/2013  . Generalized weakness 11/17/2013  . Nausea 11/17/2013  . Multilevel spine pain 11/17/2013  . Aortic stenosis, mild 11/17/2013  . DM type 2 (diabetes mellitus, type 2) ([place]) 02/02/2013  . Arthritis 10/19/2012  . Mixed incontinence 10/19/2012  . GERD (gastroesophageal reflux disease) 04/20/2011  . HTN (hypertension) 01/20/2011  . Morbid obesity ([place])     Ander Purpura, PT 08/03/2019, 2:40 PM  [place], [place], [address] Phone: [phone]   Fax:  [phone]  Name: [name] [name] MRN:  [id number] Date of Birth: Oct 06, 1945

## 2019-08-08 ENCOUNTER — Ambulatory Visit: Payer: PPO | Attending: Internal Medicine

## 2019-08-08 ENCOUNTER — Other Ambulatory Visit: Payer: Self-pay

## 2019-08-08 DIAGNOSIS — R2689 Other abnormalities of gait and mobility: Secondary | ICD-10-CM | POA: Insufficient documentation

## 2019-08-08 DIAGNOSIS — I890 Lymphedema, not elsewhere classified: Secondary | ICD-10-CM | POA: Insufficient documentation

## 2019-08-08 NOTE — Therapy (Signed)
[place], [place], [address] Phone: [phone]   Fax:  [phone]  Physical Therapy Treatment  Patient Details  Name: [name] [name] MRN: [id number] Date of Birth: May 09, 1945 Referring Provider (PT): Darci Current   Encounter Date: 08/08/2019  PT End of Session - 08/08/19 1321    Visit Number  39    Number of Visits  45    Date for PT Re-Evaluation  08/31/19    PT Start Time  1300    PT Stop Time  1353    PT Time Calculation (min)  53 min    Activity Tolerance  Patient tolerated treatment well    Behavior During Therapy  [facility] for tasks assessed/performed       Past Medical History:  Diagnosis Date  . Acute kidney injury ([place])   . Aortic stenosis, mild 11/17/2013  . Arthritis    "both knees, spine, back, fingers" (11/29/2013)  . CHF (congestive heart failure) ([place])   . Edema   . GERD (gastroesophageal reflux disease)   . Heart murmur   . History of diastolic dysfunction    Echo [id number] with diastolic dysfunction  . HTN (hypertension)   . Morbid obesity ([place])   . OA (osteoarthritis)   . PAC (premature atrial contraction)    per prior Holter  . Palpitations   . Pneumonia    "as a child"  . PVC's (premature ventricular contractions)    per prior Holter  . SVT (supraventricular tachycardia) ([place])   . Tachycardia    noted at 07/28/11 visit. Started on beta blocker. Possible atrial flutter vs long PR tachycardia/AVNRT  . Type II diabetes mellitus ([place])     Past Surgical History:  Procedure Laterality Date  . CESAREAN SECTION  1985  . CESAREAN SECTION  1985  . SUPRAVENTRICULAR TACHYCARDIA ABLATION  11/29/2013  . SUPRAVENTRICULAR TACHYCARDIA ABLATION N/A 11/29/2013   Procedure: SUPRAVENTRICULAR TACHYCARDIA ABLATION;  Surgeon: Evans Lance, MD;  Location: [facility] CATH LAB;  Service: Cardiovascular;  Laterality: N/A;  . US ECHOCARDIOGRAPHY  07/25/2008   EF 55-60%  . VAGINAL DELIVERY       There were no vitals filed for this visit.  Subjective Assessment - 08/08/19 1322    Subjective  Pt states that she has been doing okay. She continues with pain in her knees. She is waiting on social work for skin cream.    Pertinent History  osteo arthritis, hx cellulitis, phlebitis and history of edema in BLE, Hx Acute kidney injury, CHF and DM II    Currently in Pain?  Yes    Pain Score  10-Worst pain ever    Pain Location  Knee    Pain Orientation  Right;Left    Pain Descriptors / Indicators  Aching    Pain Type  Chronic pain    Pain Onset  More than a month ago    Pain Frequency  Intermittent    Aggravating Factors   movement    Pain Relieving Factors  rest                       [facility] Adult PT Treatment/Exercise - 08/08/19 0001      Manual Therapy   Manual Therapy  Compression Bandaging;Edema management    Manual therapy comments  1/4 piece of allevyn used over the anterior R lower leg, posterior R lower leg and 1/2 piece used over the R lateral lower leg, 2 ABD pads were used  over the R lower leg over allevyn and secured with kerlex, 3 ABD pads were used over the L lower leg with 2 laterally and 1 medially fixed in place with kerlex. Both legs were washed with soap and warm water after removing old wraps and before donning dressings.      Compression Bandaging  No toe wraps, single tubigrip used at Bil thighs and single layer tubigrip  at bil lower legs, 3 artiflex used then 4 short stretch bandages from foot to below the knee on the bil lower legs.              PT Education - 08/08/19 1355    Education Details  Pt will continue with her pump at home, elevate and wear compression wraps.    Person(s) Educated  Patient    Methods  Explanation    Comprehension  Verbalized understanding       PT Short Term Goals - 07/20/19 1412      PT SHORT TERM GOAL #1   Title  Pt will be independent with HEP    Baseline  Pt is working on her HEP occasionally at  home.    Status  On-going    Target Date  08/24/19        PT Long Term Goals - 07/20/19 1412      PT LONG TERM GOAL #1   Title  Patient will have reduction of L lower leg limb girth by 3 cm    Baseline  measurements not taken today time spent dressing legs.    Status  On-going    Target Date  08/24/19      PT LONG TERM GOAL #2   Title  Patient to be properly fitted with compression garment to wear on daily basis.    Baseline  pt has not been able to wear due to skin integrity issues and missed appointments related to transportation and covid vaccination    Status  On-going    Target Date  08/24/19      PT LONG TERM GOAL #3   Title  Patient will be independent in self-care management principles including self-massage/bandaging and long term management plan for edema.    Baseline  Pt continues to struggle with managing her lymphedema.    Status  On-going    Target Date  08/24/19            Plan - 08/08/19 1321    Clinical Impression Statement  Pt continues with significant improvement in her Bil lower extremities; she continues with a couple of open areas smaller than a dime on the R anterior/posterior lower leg and then 1 small half coin sized area on the R lower leg; all areas on the L lower leg have closed. Skin remains very delicate and a big obstacle with pt getting into compression due to skin is easy to rupture and weep. Pt has no odor this session. Continued with extra foam/cushioning and layers at the lower legs this session. Pt was wrapped with multi layered compression wrap following hygiene and dressings. Pt will benefit from continued POC at this time.    Personal Factors and Comorbidities  Age;Comorbidity 3+    Comorbidities  CHF, acute kidney injury, DM II    Rehab Potential  Good    PT Frequency  2x / week    PT Duration  Other (comment)    PT Treatment/Interventions  Electrical Stimulation;Cryotherapy;Gait training;Stair training;Functional mobility  training;Therapeutic activities;Therapeutic exercise;Balance training;Neuromuscular re-education;Patient/family education;Manual lymph drainage;Compression  bandaging;Passive range of motion;Taping;Vasopneumatic Device    PT Next Visit Plan  Continue with wrapping, help order garments if needed. Ask if she has spoke to Education officer, museum?    PT Home Exercise Plan  pump and exercises    Consulted and Agree with Plan of Care  Patient       Patient will benefit from skilled therapeutic intervention in order to improve the following deficits and impairments:  Difficulty walking, Increased edema  Visit Diagnosis: Lymphedema  Other abnormalities of gait and mobility     Problem List Patient Active Problem List   Diagnosis Date Noted  . Allergic rhinitis 07/28/2019  . Postnasal drip 11/16/2017  . Sensorineural hearing loss (SNHL), bilateral 11/16/2017  . Tinnitus of both ears 11/16/2017  . Obesity, Class II, BMI 35-39.9, isolated (see actual BMI) 11/08/2017  . CKD (chronic kidney disease), stage III 11/08/2017  . Anemia 11/07/2017  . Thrombocytosis ([place]) 11/07/2017  . Lymphedema 11/07/2017  . Routine general medical examination at a health care facility 05/10/2014  . Paroxysmal SVT (supraventricular tachycardia) ([place]) 11/28/2013  . Physical deconditioning 11/23/2013  . Generalized weakness 11/17/2013  . Nausea 11/17/2013  . Multilevel spine pain 11/17/2013  . Aortic stenosis, mild 11/17/2013  . DM type 2 (diabetes mellitus, type 2) ([place]) 02/02/2013  . Arthritis 10/19/2012  . Mixed incontinence 10/19/2012  . GERD (gastroesophageal reflux disease) 04/20/2011  . HTN (hypertension) 01/20/2011  . Morbid obesity ([place])     Ander Purpura, PT 08/08/2019, 1:58 PM  [place], [place], [address] Phone: [phone]   Fax:  [phone]  Name: [name] [name] MRN: [id number] Date of Birth: 1945-08-28

## 2019-08-10 ENCOUNTER — Ambulatory Visit: Payer: PPO

## 2019-08-15 ENCOUNTER — Ambulatory Visit: Payer: PPO

## 2019-08-15 ENCOUNTER — Other Ambulatory Visit: Payer: Self-pay

## 2019-08-15 DIAGNOSIS — R2689 Other abnormalities of gait and mobility: Secondary | ICD-10-CM

## 2019-08-15 DIAGNOSIS — I890 Lymphedema, not elsewhere classified: Secondary | ICD-10-CM | POA: Diagnosis not present

## 2019-08-15 NOTE — Therapy (Signed)
[place], [place], [address] Phone: [phone]   Fax:  [phone]  Physical Therapy Treatment  Patient Details  Name: [name] [name] MRN: [id number] Date of Birth: 08/27/1945 Referring Provider (PT): Darci Current   Encounter Date: 08/15/2019  PT End of Session - 08/15/19 1353    Visit Number  40    Number of Visits  45    Date for PT Re-Evaluation  08/31/19    PT Start Time  1300    PT Stop Time  1345    PT Time Calculation (min)  45 min    Activity Tolerance  Patient tolerated treatment well    Behavior During Therapy  [facility] for tasks assessed/performed       Past Medical History:  Diagnosis Date  . Acute kidney injury ([place])   . Aortic stenosis, mild 11/17/2013  . Arthritis    "both knees, spine, back, fingers" (11/29/2013)  . CHF (congestive heart failure) ([place])   . Edema   . GERD (gastroesophageal reflux disease)   . Heart murmur   . History of diastolic dysfunction    Echo [id number] with diastolic dysfunction  . HTN (hypertension)   . Morbid obesity ([place])   . OA (osteoarthritis)   . PAC (premature atrial contraction)    per prior Holter  . Palpitations   . Pneumonia    "as a child"  . PVC's (premature ventricular contractions)    per prior Holter  . SVT (supraventricular tachycardia) ([place])   . Tachycardia    noted at 07/28/11 visit. Started on beta blocker. Possible atrial flutter vs long PR tachycardia/AVNRT  . Type II diabetes mellitus ([place])     Past Surgical History:  Procedure Laterality Date  . CESAREAN SECTION  1985  . CESAREAN SECTION  1985  . SUPRAVENTRICULAR TACHYCARDIA ABLATION  11/29/2013  . SUPRAVENTRICULAR TACHYCARDIA ABLATION N/A 11/29/2013   Procedure: SUPRAVENTRICULAR TACHYCARDIA ABLATION;  Surgeon: Evans Lance, MD;  Location: [facility] CATH LAB;  Service: Cardiovascular;  Laterality: N/A;  . US ECHOCARDIOGRAPHY  07/25/2008   EF 55-60%  . VAGINAL DELIVERY       There were no vitals filed for this visit.  Subjective Assessment - 08/15/19 1350    Subjective  Pt states that on Thursday her wraps started to slide down. she removed the top wrap. She states that she felt something wet on the back of the L leg.    Pertinent History  osteo arthritis, hx cellulitis, phlebitis and history of edema in BLE, Hx Acute kidney injury, CHF and DM II    Patient Stated Goals  I want to get this swelling down and heal my wound.    Currently in Pain?  Yes    Pain Score  8     Pain Location  Knee    Pain Orientation  Right;Left    Pain Descriptors / Indicators  Aching    Pain Type  Chronic pain    Pain Onset  More than a month ago    Pain Frequency  Intermittent    Aggravating Factors   movement    Pain Relieving Factors  rest                       [facility] Adult PT Treatment/Exercise - 08/15/19 0001      Manual Therapy   Manual Therapy  Compression Bandaging;Edema management    Manual therapy comments  1/2 piece of allevyn used over the  lateral/proximal L lower leg where pt has small surfaces opening over where the wraps had slid, 2 small pieces of allevyn used over the R anterior lower leg where pt has small pin prick weeping areas; ABD pads used over the bil lower legs to manage weeping. Wrapped with Kerlex     Edema Management  Used Edema wear over dressing on Bil lower extremities and under artiflex.     Compression Bandaging  No toe wraps, artiflex 1 piece used on bil lower legs and 3 short stretch bandages.              PT Education - 08/15/19 1353    Education Details  Pt will continue with her pump even though used edema wear this session all delicate skin on the lower legs has been covered with dressings.    Person(s) Educated  Patient    Methods  Explanation    Comprehension  Verbalized understanding       PT Short Term Goals - 08/15/19 1358      PT SHORT TERM GOAL #1   Title  Pt will be independent with HEP    Baseline   Pt is working on her HEP occasionally at home.    Status  On-going    Target Date  08/24/19        PT Long Term Goals - 08/15/19 1358      PT LONG TERM GOAL #1   Title  Patient will have reduction of L lower leg limb girth by 3 cm    Baseline  Pt continues with fluctuations in edema.    Status  On-going    Target Date  08/24/19      PT LONG TERM GOAL #2   Title  Patient to be properly fitted with compression garment to wear on daily basis.    Baseline  due to pt mobility/financial issues it has been difficult to find something that is appropriate for patient    Status  On-going    Target Date  08/24/19      PT LONG TERM GOAL #3   Title  Patient will be independent in self-care management principles including self-massage/bandaging and long term management plan for edema.    Baseline  Pt continues to struggle with managing her lymphedema.    Status  On-going    Target Date  08/24/19            Plan - 08/15/19 1353    Clinical Impression Statement  Pt presents for her 10th visit note today after one week due to missing her last appointment secondary to transportation issues. Her wraps had slid down her lower legs mid-way with significant edema over wrap in the lower legs but skin maintain improvment; the skin broke open and was weeping due to increased edema in the L lower leg but skin below that area continues healed up. She was dressed this session using allevyn and ABD pads with kerlex used to hold in place. Edema wear is being trials today to add an extra level of continuous compression from the lower leg to the thigh with 1 artiflex and 3 short stretch bandages placed over edema wear. Discussed the use of sigvaris compre capri's due to they are silky inside and may be an option for patient to control edema in the BLE and be quick to remove due to pt concerns of incontinence. She was agreeable to contact social worker about possibly getting these for her. Pt is progressing with  her lymphedema but continues with fluctuations in size and with skin due to weak skin that opens easily and pt mobility issues which makes it difficult for her to don compression. Pt will benefit from continued POC at this time until she is able to maintain her swelling at home to prevent wounds, infection and immobilty resulting in hospitalization.    Personal Factors and Comorbidities  Age;Comorbidity 3+    Comorbidities  CHF, acute kidney injury, DM II    Rehab Potential  Good    PT Frequency  2x / week    PT Duration  Other (comment)    PT Treatment/Interventions  Electrical Stimulation;Cryotherapy;Gait training;Stair training;Functional mobility training;Therapeutic activities;Therapeutic exercise;Balance training;Neuromuscular re-education;Patient/family education;Manual lymph drainage;Compression bandaging;Passive range of motion;Taping;Vasopneumatic Device    PT Next Visit Plan  Continue with wrapping, help order garments if needed. Ask if she has spoke to Education officer, museum?    PT Home Exercise Plan  pump and exercises    Consulted and Agree with Plan of Care  Patient       Patient will benefit from skilled therapeutic intervention in order to improve the following deficits and impairments:  Difficulty walking, Increased edema  Visit Diagnosis: Lymphedema  Other abnormalities of gait and mobility     Problem List Patient Active Problem List   Diagnosis Date Noted  . Allergic rhinitis 07/28/2019  . Postnasal drip 11/16/2017  . Sensorineural hearing loss (SNHL), bilateral 11/16/2017  . Tinnitus of both ears 11/16/2017  . Obesity, Class II, BMI 35-39.9, isolated (see actual BMI) 11/08/2017  . CKD (chronic kidney disease), stage III 11/08/2017  . Anemia 11/07/2017  . Thrombocytosis ([place]) 11/07/2017  . Lymphedema 11/07/2017  . Routine general medical examination at a health care facility 05/10/2014  . Paroxysmal SVT (supraventricular tachycardia) ([place]) 11/28/2013  . Physical  deconditioning 11/23/2013  . Generalized weakness 11/17/2013  . Nausea 11/17/2013  . Multilevel spine pain 11/17/2013  . Aortic stenosis, mild 11/17/2013  . DM type 2 (diabetes mellitus, type 2) ([place]) 02/02/2013  . Arthritis 10/19/2012  . Mixed incontinence 10/19/2012  . GERD (gastroesophageal reflux disease) 04/20/2011  . HTN (hypertension) 01/20/2011  . Morbid obesity [facility])     Ander Purpura, PT 08/15/2019, 1:59 PM  [place], [place], [address] Phone: [phone]   Fax:  [phone]  Name: [name] [name] MRN: [id number] Date of Birth: [birth year]

## 2019-08-17 ENCOUNTER — Other Ambulatory Visit: Payer: Self-pay

## 2019-08-17 ENCOUNTER — Ambulatory Visit: Payer: PPO

## 2019-08-17 DIAGNOSIS — R2689 Other abnormalities of gait and mobility: Secondary | ICD-10-CM

## 2019-08-17 DIAGNOSIS — I890 Lymphedema, not elsewhere classified: Secondary | ICD-10-CM | POA: Diagnosis not present

## 2019-08-17 NOTE — Therapy (Addendum)
[place], [place], [address] Phone: [phone]   Fax:  [phone]  Physical Therapy Treatment  Patient Details  Name: [name] [name] MRN: [id number] Date of Birth: November 24, 1945 Referring Provider (PT): Darci Current   Encounter Date: 08/17/2019  PT End of Session - 08/17/19 1402    Visit Number  41    Number of Visits  45    Date for PT Re-Evaluation  08/31/19    PT Start Time  1300    PT Stop Time  1356    PT Time Calculation (min)  56 min    Activity Tolerance  Patient tolerated treatment well    Behavior During Therapy  [facility] for tasks assessed/performed       Past Medical History:  Diagnosis Date  . Acute kidney injury ([place])   . Aortic stenosis, mild 11/17/2013  . Arthritis    "both knees, spine, back, fingers" (11/29/2013)  . CHF (congestive heart failure) ([place])   . Edema   . GERD (gastroesophageal reflux disease)   . Heart murmur   . History of diastolic dysfunction    Echo 123XX123 with diastolic dysfunction  . HTN (hypertension)   . Morbid obesity ([place])   . OA (osteoarthritis)   . PAC (premature atrial contraction)    per prior Holter  . Palpitations   . Pneumonia    "as a child"  . PVC's (premature ventricular contractions)    per prior Holter  . SVT (supraventricular tachycardia) ([place])   . Tachycardia    noted at 07/28/11 visit. Started on beta blocker. Possible atrial flutter vs long PR tachycardia/AVNRT  . Type II diabetes mellitus ([place])     Past Surgical History:  Procedure Laterality Date  . CESAREAN SECTION  1985  . CESAREAN SECTION  1985  . SUPRAVENTRICULAR TACHYCARDIA ABLATION  11/29/2013  . SUPRAVENTRICULAR TACHYCARDIA ABLATION N/A 11/29/2013   Procedure: SUPRAVENTRICULAR TACHYCARDIA ABLATION;  Surgeon: Evans Lance, MD;  Location: [facility] CATH LAB;  Service: Cardiovascular;  Laterality: N/A;  . US ECHOCARDIOGRAPHY  07/25/2008   EF 55-60%  . VAGINAL DELIVERY       There were no vitals filed for this visit.         LYMPHEDEMA/ONCOLOGY QUESTIONNAIRE - 08/17/19 1355      Right Lower Extremity Lymphedema   Other  74.3 cm L thigh, 74.4 cm R thigh     Other  Waist 115.5 cm     Other  Hip 156 cm                  [facility] Adult PT Treatment/Exercise - 08/17/19 0001      Manual Therapy   Manual Therapy  Compression Bandaging;Edema management    Manual therapy comments  1 piece of allevyn of the lateral L lower leg then 2 abd pads at lower leg and 1 abd pad over allevyn, R lower leg 1 small piece of allevyn over anteiror R lower leg and 1/2 allevyn over the posterior/lateral R lower leg with 2 ABD pads over the R lower leg. Kerlex was used to hold in place    Edema Management  Used Edema wear over dressing on Bil lower extremities and under artiflex and tubigrip over the bil thighs    Compression Bandaging  No toe wraps, artiflex 1 piece used on bil lower legs and 3 short stretch bandages.              PT Education - 08/17/19 1401  Education Details  Pt was measured for compression shorts this session discussed possible use of edema wear doubled up on the lower legs with compression shorts/capris over the legs. Discussed that she may need someone to come to her home to help her don these due to pt is unable to don garments .    Person(s) Educated  Patient    Methods  Explanation    Comprehension  Verbalized understanding       PT Short Term Goals - 08/15/19 1358      PT SHORT TERM GOAL #1   Title  Pt will be independent with HEP    Baseline  Pt is working on her HEP occasionally at home.    Status  On-going    Target Date  08/24/19        PT Long Term Goals - 08/15/19 1358      PT LONG TERM GOAL #1   Title  Patient will have reduction of L lower leg limb girth by 3 cm    Baseline  Pt continues with fluctuations in edema.    Status  On-going    Target Date  08/24/19      PT LONG TERM GOAL #2   Title  Patient to  be properly fitted with compression garment to wear on daily basis.    Baseline  due to pt mobility/financial issues it has been difficult to find something that is appropriate for patient    Status  On-going    Target Date  08/24/19      PT LONG TERM GOAL #3   Title  Patient will be independent in self-care management principles including self-massage/bandaging and long term management plan for edema.    Baseline  Pt continues to struggle with managing her lymphedema.    Status  On-going    Target Date  08/24/19            Plan - 08/17/19 1402    Clinical Impression Statement  Pt presents to physical therapy with significant improvement in the shape of her legs with edema wear use under the compression wrap this session. She was measured for sigvaris compreshorts capri's or shorts and discussed the use of these wtih patient including that she may need assistance at home to don compression garments due to mobility difficulties. Pt skin continues to heal with area on the L proximal lateral lower leg still open and weeping due; cushioned with extra allevyn and ABD pad this session before wrap was placed. Compressoin wrap was placed over edema wear on the lower legs folded at bil feet and tubirip was used at bil thighs. Pt will benefit from continued POC at this time.    Personal Factors and Comorbidities  Age;Comorbidity 3+    Comorbidities  CHF, acute kidney injury, DM II    Stability/Clinical Decision Making  Stable/Uncomplicated    PT Frequency  2x / week    PT Duration  Other (comment)    PT Treatment/Interventions  Electrical Stimulation;Cryotherapy;Gait training;Stair training;Functional mobility training;Therapeutic activities;Therapeutic exercise;Balance training;Neuromuscular re-education;Patient/family education;Manual lymph drainage;Compression bandaging;Passive range of motion;Taping;Vasopneumatic Device    PT Next Visit Plan  Continue with wrapping, help order garments if needed.  Ask if she has spoke to Education officer, museum?    PT Home Exercise Plan  pump and exercises    Recommended Other Services  between 2XL and XL Max of compreshort    Consulted and Agree with Plan of Care  Patient       Patient  will benefit from skilled therapeutic intervention in order to improve the following deficits and impairments:  Difficulty walking, Increased edema  Visit Diagnosis: Lymphedema  Other abnormalities of gait and mobility     Problem List Patient Active Problem List   Diagnosis Date Noted  . Allergic rhinitis 07/28/2019  . Postnasal drip 11/16/2017  . Sensorineural hearing loss (SNHL), bilateral 11/16/2017  . Tinnitus of both ears 11/16/2017  . Obesity, Class II, BMI 35-39.9, isolated (see actual BMI) 11/08/2017  . CKD (chronic kidney disease), stage III 11/08/2017  . Anemia 11/07/2017  . Thrombocytosis ([place]) 11/07/2017  . Lymphedema 11/07/2017  . Routine general medical examination at a health care facility 05/10/2014  . Paroxysmal SVT (supraventricular tachycardia) ([place]) 11/28/2013  . Physical deconditioning 11/23/2013  . Generalized weakness 11/17/2013  . Nausea 11/17/2013  . Multilevel spine pain 11/17/2013  . Aortic stenosis, mild 11/17/2013  . DM type 2 (diabetes mellitus, type 2) ([place]) 02/02/2013  . Arthritis 10/19/2012  . Mixed incontinence 10/19/2012  . GERD (gastroesophageal reflux disease) 04/20/2011  . HTN (hypertension) 01/20/2011  . Morbid obesity ([place])     Ander Purpura, PT 08/17/2019, 2:10 PM  [place], [place], [address] Phone: [phone]   Fax:  [phone]  Name: Christasia Trench MRN: [id number] Date of Birth: May 31, 1945

## 2019-08-22 ENCOUNTER — Ambulatory Visit: Payer: PPO

## 2019-08-22 ENCOUNTER — Other Ambulatory Visit: Payer: Self-pay

## 2019-08-22 DIAGNOSIS — I890 Lymphedema, not elsewhere classified: Secondary | ICD-10-CM | POA: Diagnosis not present

## 2019-08-22 DIAGNOSIS — R2689 Other abnormalities of gait and mobility: Secondary | ICD-10-CM

## 2019-08-22 NOTE — Therapy (Signed)
[place], [place], [address] Phone: [phone]   Fax:  [phone]  Physical Therapy Treatment  Patient Details  Name: [name] [name] MRN: [id number] Date of Birth: 1945-12-25 Referring Provider (PT): Darci Current   Encounter Date: 08/22/2019  PT End of Session - 08/22/19 1502    Visit Number  42    Number of Visits  45    Date for PT Re-Evaluation  08/31/19    PT Start Time  1400    PT Stop Time  1453    PT Time Calculation (min)  53 min    Activity Tolerance  Patient tolerated treatment well    Behavior During Therapy  [facility] for tasks assessed/performed       Past Medical History:  Diagnosis Date  . Acute kidney injury ([place])   . Aortic stenosis, mild 11/17/2013  . Arthritis    "both knees, spine, back, fingers" (11/29/2013)  . CHF (congestive heart failure) ([place])   . Edema   . GERD (gastroesophageal reflux disease)   . Heart murmur   . History of diastolic dysfunction    Echo 123XX123 with diastolic dysfunction  . HTN (hypertension)   . Morbid obesity ([place])   . OA (osteoarthritis)   . PAC (premature atrial contraction)    per prior Holter  . Palpitations   . Pneumonia    "as a child"  . PVC's (premature ventricular contractions)    per prior Holter  . SVT (supraventricular tachycardia) ([place])   . Tachycardia    noted at 07/28/11 visit. Started on beta blocker. Possible atrial flutter vs long PR tachycardia/AVNRT  . Type II diabetes mellitus ([place])     Past Surgical History:  Procedure Laterality Date  . CESAREAN SECTION  1985  . CESAREAN SECTION  1985  . SUPRAVENTRICULAR TACHYCARDIA ABLATION  11/29/2013  . SUPRAVENTRICULAR TACHYCARDIA ABLATION N/A 11/29/2013   Procedure: SUPRAVENTRICULAR TACHYCARDIA ABLATION;  Surgeon: Evans Lance, MD;  Location: [facility] CATH LAB;  Service: Cardiovascular;  Laterality: N/A;  . US ECHOCARDIOGRAPHY  07/25/2008   EF 55-60%  . VAGINAL DELIVERY       There were no vitals filed for this visit.  Subjective Assessment - 08/22/19 1459    Subjective  Pt reports that she is about the same. She has been wearing her wraps since her last session and continues to try to pump as often as she is able.    Pertinent History  osteo arthritis, hx cellulitis, phlebitis and history of edema in BLE, Hx Acute kidney injury, CHF and DM II    Patient Stated Goals  I want to get this swelling down and heal my wound.    Currently in Pain?  Yes    Pain Score  8     Pain Location  Knee    Pain Orientation  Left;Right    Pain Descriptors / Indicators  Aching    Pain Onset  More than a month ago    Pain Frequency  Intermittent    Aggravating Factors   movement    Pain Relieving Factors  rest                        [facility] Adult PT Treatment/Exercise - 08/22/19 0001      Manual Therapy   Manual Therapy  Compression Bandaging;Edema management    Manual therapy comments  3 allevyn of the lateral L lower leg then 2 abd pads at lower  leg and 1 abd pad over allevyn, R lower leg 1 allevyn over anteiror R lower leg and 1/2 allevyn over the posterior/lateral R lower leg with 2 ABD pads over the R lower leg. Kerlex was used to hold in place    Edema Management  Used Edema wear over dressing on Bil lower extremities and under artiflex.     Compression Bandaging  No toe wraps, artiflex 1 piece used on bil lower legs and 4 short stretch bandages.              PT Education - 08/22/19 1502    Education Details  Pt will continue with what she is doing at home until she is able to get appropriate compression.    Person(s) Educated  Patient    Methods  Explanation    Comprehension  Verbalized understanding       PT Short Term Goals - 08/15/19 1358      PT SHORT TERM GOAL #1   Title  Pt will be independent with HEP    Baseline  Pt is working on her HEP occasionally at home.    Status  On-going    Target Date  08/24/19        PT Long  Term Goals - 08/15/19 1358      PT LONG TERM GOAL #1   Title  Patient will have reduction of L lower leg limb girth by 3 cm    Baseline  Pt continues with fluctuations in edema.    Status  On-going    Target Date  08/24/19      PT LONG TERM GOAL #2   Title  Patient to be properly fitted with compression garment to wear on daily basis.    Baseline  due to pt mobility/financial issues it has been difficult to find something that is appropriate for patient    Status  On-going    Target Date  08/24/19      PT LONG TERM GOAL #3   Title  Patient will be independent in self-care management principles including self-massage/bandaging and long term management plan for edema.    Baseline  Pt continues to struggle with managing her lymphedema.    Status  On-going    Target Date  08/24/19            Plan - 08/22/19 1503    Clinical Impression Statement  Pt continues with continued improvement in her BIL LE. She does have increased fluid in the lower legs from last session most likely related to the edemawear had folded under the bil knees and trapped some fluid that cuased increased weeping at the L lateral leg. Increased allevyn was used this session to help absorb fluid and decrease maceration at the L lateral leg. Navigator from [facility] stated that she will see if we are able to purchase compression for patient due to financial difficulties. Edemawear was used from foot to thigh this session; discussed with patient importance of adjusting if the edemawear rolls to prevent skin break down. Compression wrap was placed over edema wear on the lower legs. Pt will benefit from continued POC at this time.    Personal Factors and Comorbidities  Age;Comorbidity 3+    Comorbidities  CHF, acute kidney injury, DM II    Rehab Potential  Good    PT Frequency  2x / week    PT Duration  Other (comment)    PT Treatment/Interventions  Electrical Stimulation;Cryotherapy;Gait training;Stair  training;Functional mobility training;Therapeutic  activities;Therapeutic exercise;Balance training;Neuromuscular re-education;Patient/family education;Manual lymph drainage;Compression bandaging;Passive range of motion;Taping;Vasopneumatic Device    PT Next Visit Plan  Continue with wrapping, help order garments if needed. Ask if she has spoke to Education officer, museum?    PT Home Exercise Plan  pump and exercises    Consulted and Agree with Plan of Care  Patient       Patient will benefit from skilled therapeutic intervention in order to improve the following deficits and impairments:  Difficulty walking, Increased edema  Visit Diagnosis: Lymphedema  Other abnormalities of gait and mobility     Problem List Patient Active Problem List   Diagnosis Date Noted  . Allergic rhinitis 07/28/2019  . Postnasal drip 11/16/2017  . Sensorineural hearing loss (SNHL), bilateral 11/16/2017  . Tinnitus of both ears 11/16/2017  . Obesity, Class II, BMI 35-39.9, isolated (see actual BMI) 11/08/2017  . CKD (chronic kidney disease), stage III 11/08/2017  . Anemia 11/07/2017  . Thrombocytosis ([place]) 11/07/2017  . Lymphedema 11/07/2017  . Routine general medical examination at a health care facility 05/10/2014  . Paroxysmal SVT (supraventricular tachycardia) ([place]) 11/28/2013  . Physical deconditioning 11/23/2013  . Generalized weakness 11/17/2013  . Nausea 11/17/2013  . Multilevel spine pain 11/17/2013  . Aortic stenosis, mild 11/17/2013  . DM type 2 (diabetes mellitus, type 2) ([place]) 02/02/2013  . Arthritis 10/19/2012  . Mixed incontinence 10/19/2012  . GERD (gastroesophageal reflux disease) 04/20/2011  . HTN (hypertension) 01/20/2011  . Morbid obesity [facility])     Ander Purpura, PT 08/22/2019, 3:06 PM  [place], [place], [address] Phone: [phone]   Fax:  [phone]  Name: Ennis Crite MRN: [id number] Date  of Birth: 05-Apr-1946

## 2019-08-29 ENCOUNTER — Ambulatory Visit: Payer: PPO

## 2019-08-29 ENCOUNTER — Other Ambulatory Visit: Payer: Self-pay

## 2019-08-29 DIAGNOSIS — I890 Lymphedema, not elsewhere classified: Secondary | ICD-10-CM | POA: Diagnosis not present

## 2019-08-29 DIAGNOSIS — R2689 Other abnormalities of gait and mobility: Secondary | ICD-10-CM

## 2019-08-29 NOTE — Therapy (Signed)
[place], [place], [address] Phone: [phone]   Fax:  [phone]  Physical Therapy Treatment  Patient Details  Name: [name] [name] MRN: [id number] Date of Birth: 06-Oct-1945 Referring Provider (PT): Darci Current   Encounter Date: 08/29/2019  PT End of Session - 08/29/19 1311    Visit Number  43    Number of Visits  45    Date for PT Re-Evaluation  08/31/19    PT Start Time  1307    PT Stop Time  1400    PT Time Calculation (min)  53 min    Activity Tolerance  Patient tolerated treatment well    Behavior During Therapy  [facility] for tasks assessed/performed       Past Medical History:  Diagnosis Date  . Acute kidney injury ([place])   . Aortic stenosis, mild 11/17/2013  . Arthritis    "both knees, spine, back, fingers" (11/29/2013)  . CHF (congestive heart failure) ([place])   . Edema   . GERD (gastroesophageal reflux disease)   . Heart murmur   . History of diastolic dysfunction    Echo 123XX123 with diastolic dysfunction  . HTN (hypertension)   . Morbid obesity ([place])   . OA (osteoarthritis)   . PAC (premature atrial contraction)    per prior Holter  . Palpitations   . Pneumonia    "as a child"  . PVC's (premature ventricular contractions)    per prior Holter  . SVT (supraventricular tachycardia) ([place])   . Tachycardia    noted at 07/28/11 visit. Started on beta blocker. Possible atrial flutter vs long PR tachycardia/AVNRT  . Type II diabetes mellitus ([place])     Past Surgical History:  Procedure Laterality Date  . CESAREAN SECTION  1985  . CESAREAN SECTION  1985  . SUPRAVENTRICULAR TACHYCARDIA ABLATION  11/29/2013  . SUPRAVENTRICULAR TACHYCARDIA ABLATION N/A 11/29/2013   Procedure: SUPRAVENTRICULAR TACHYCARDIA ABLATION;  Surgeon: Evans Lance, MD;  Location: [facility] CATH LAB;  Service: Cardiovascular;  Laterality: N/A;  . US ECHOCARDIOGRAPHY  07/25/2008   EF 55-60%  . VAGINAL DELIVERY       There were no vitals filed for this visit.  Subjective Assessment - 08/29/19 1311    Subjective  Pt states that she keeps getting bills from [facility] and will have to send the pump back if they can't figure that out. She also states that the edema wear has been bothering her but she was unable to make her last visit so she has been wearing it for a full week which patient is aware will effect her outcome.    Pertinent History  osteo arthritis, hx cellulitis, phlebitis and history of edema in BLE, Hx Acute kidney injury, CHF and DM II    Patient Stated Goals  I want to get this swelling down and heal my wound.    Currently in Pain?  Yes    Pain Score  8     Pain Location  Knee    Pain Orientation  Right;Left    Pain Descriptors / Indicators  Aching    Pain Onset  More than a month ago    Aggravating Factors   movement    Pain Relieving Factors  rest                        [facility] Adult PT Treatment/Exercise - 08/29/19 0001      Manual Therapy   Manual Therapy  Compression Bandaging;Edema management    Manual therapy comments  2 allevyn were used on the R lateral lower leg, 4 allevyn were used on the L lateral/posterior lower leg, pt lower extremities were washed with warm water, rinsed and dressed prior to compression wrap. ABD pads covered allevyn pads and then held in place with Kerlex.     Compression Bandaging  No toe wraps, doubled tubigrip at bil lower legs and thighs then wrapped with 1.5 artiflex and 3 short stretch bandages on each leg.              PT Education - 08/29/19 1400    Education Details  Pt will continue with what she is doing at home until she is able to get appropriate compression garments.    Person(s) Educated  Patient    Methods  Explanation    Comprehension  Verbalized understanding       PT Short Term Goals - 08/15/19 1358      PT SHORT TERM GOAL #1   Title  Pt will be independent with HEP    Baseline  Pt is working on her  HEP occasionally at home.    Status  On-going    Target Date  08/24/19        PT Long Term Goals - 08/15/19 1358      PT LONG TERM GOAL #1   Title  Patient will have reduction of L lower leg limb girth by 3 cm    Baseline  Pt continues with fluctuations in edema.    Status  On-going    Target Date  08/24/19      PT LONG TERM GOAL #2   Title  Patient to be properly fitted with compression garment to wear on daily basis.    Baseline  due to pt mobility/financial issues it has been difficult to find something that is appropriate for patient    Status  On-going    Target Date  08/24/19      PT LONG TERM GOAL #3   Title  Patient will be independent in self-care management principles including self-massage/bandaging and long term management plan for edema.    Baseline  Pt continues to struggle with managing her lymphedema.    Status  On-going    Target Date  08/24/19            Plan - 08/29/19 1311    Clinical Impression Statement  Pt size and shape continues to improve using the edema wear. Due to prolonged time between visits pt skin continues to fluctuate with difficulty keeping her skin intact and dry. When pt is able to come 2x/week her skin improves quickly; today due to prolonged time between visits secondary to pt canceling visit her skin has macerated and sloughed off. Increased odor this session. Continued with allevyn and ABD pads at the lowe rlegs held in PLaced with kerlex under compression wrap. Pt continues to have difficulty following instruction for adjusting any type of garment including edemawear or tubigrip over her thighs. Pt requires home health help to manage her legs/edema and to be discharged from physical therapy. Pt will benefit from continued POC at this time.    Personal Factors and Comorbidities  Age;Comorbidity 3+    Comorbidities  CHF, acute kidney injury, DM II    Rehab Potential  Good    PT Frequency  2x / week    PT Duration  Other (comment)    PT  Treatment/Interventions  Electrical Stimulation;Cryotherapy;Gait training;Stair  training;Functional mobility training;Therapeutic activities;Therapeutic exercise;Balance training;Neuromuscular re-education;Patient/family education;Manual lymph drainage;Compression bandaging;Passive range of motion;Taping;Vasopneumatic Device    PT Next Visit Plan  Continue with wrapping, help order garments if needed. Ask if she has spoke to Education officer, museum?    PT Home Exercise Plan  pump and exercises    Consulted and Agree with Plan of Care  Patient       Patient will benefit from skilled therapeutic intervention in order to improve the following deficits and impairments:  Difficulty walking, Increased edema  Visit Diagnosis: Lymphedema  Other abnormalities of gait and mobility     Problem List Patient Active Problem List   Diagnosis Date Noted  . Allergic rhinitis 07/28/2019  . Postnasal drip 11/16/2017  . Sensorineural hearing loss (SNHL), bilateral 11/16/2017  . Tinnitus of both ears 11/16/2017  . Obesity, Class II, BMI 35-39.9, isolated (see actual BMI) 11/08/2017  . CKD (chronic kidney disease), stage III 11/08/2017  . Anemia 11/07/2017  . Thrombocytosis ([place]) 11/07/2017  . Lymphedema 11/07/2017  . Routine general medical examination at a health care facility 05/10/2014  . Paroxysmal SVT (supraventricular tachycardia) ([place]) 11/28/2013  . Physical deconditioning 11/23/2013  . Generalized weakness 11/17/2013  . Nausea 11/17/2013  . Multilevel spine pain 11/17/2013  . Aortic stenosis, mild 11/17/2013  . DM type 2 (diabetes mellitus, type 2) ([place]) 02/02/2013  . Arthritis 10/19/2012  . Mixed incontinence 10/19/2012  . GERD (gastroesophageal reflux disease) 04/20/2011  . HTN (hypertension) 01/20/2011  . Morbid obesity [facility])     Ander Purpura, PT 08/29/2019, 2:03 PM  [place], [place], [address] Phone:  [phone]   Fax:  [phone]  Name: [name] [name] MRN: [id number] Date of Birth: 1945/12/31

## 2019-08-31 ENCOUNTER — Ambulatory Visit: Payer: PPO

## 2019-08-31 ENCOUNTER — Other Ambulatory Visit: Payer: Self-pay

## 2019-08-31 DIAGNOSIS — R2689 Other abnormalities of gait and mobility: Secondary | ICD-10-CM

## 2019-08-31 DIAGNOSIS — I890 Lymphedema, not elsewhere classified: Secondary | ICD-10-CM

## 2019-08-31 NOTE — Therapy (Signed)
[name], [place], [address] Phone: [phone]   Fax:  [phone]  Physical Therapy Treatment  Patient Details  Name: Shawnetta Loughry MRN: [id number] Date of Birth: [birth year] Referring Provider (PT): Darci Current   Encounter Date: 08/31/2019  PT End of Session - 08/31/19 1407    Visit Number  44    Number of Visits  45    Date for PT Re-Evaluation  08/31/19    PT Start Time  1300    PT Stop Time  1345    PT Time Calculation (min)  45 min    Activity Tolerance  Patient tolerated treatment well    Behavior During Therapy  [facility] for tasks assessed/performed       Past Medical History:  Diagnosis Date  . Acute kidney injury ([place])   . Aortic stenosis, mild 11/17/2013  . Arthritis    "both knees, spine, back, fingers" (11/29/2013)  . CHF (congestive heart failure) ([place])   . Edema   . GERD (gastroesophageal reflux disease)   . Heart murmur   . History of diastolic dysfunction    Echo [id number] with diastolic dysfunction  . HTN (hypertension)   . Morbid obesity ([place])   . OA (osteoarthritis)   . PAC (premature atrial contraction)    per prior Holter  . Palpitations   . Pneumonia    "as a child"  . PVC's (premature ventricular contractions)    per prior Holter  . SVT (supraventricular tachycardia) ([place])   . Tachycardia    noted at 07/28/11 visit. Started on beta blocker. Possible atrial flutter vs long PR tachycardia/AVNRT  . Type II diabetes mellitus ([place])     Past Surgical History:  Procedure Laterality Date  . CESAREAN SECTION  1985  . CESAREAN SECTION  1985  . SUPRAVENTRICULAR TACHYCARDIA ABLATION  11/29/2013  . SUPRAVENTRICULAR TACHYCARDIA ABLATION N/A 11/29/2013   Procedure: SUPRAVENTRICULAR TACHYCARDIA ABLATION;  Surgeon: Evans Lance, MD;  Location: [facility] CATH LAB;  Service: Cardiovascular;  Laterality: N/A;  . US ECHOCARDIOGRAPHY  07/25/2008   EF 55-60%  . VAGINAL DELIVERY       There were no vitals filed for this visit.  Subjective Assessment - 08/31/19 1407    Subjective  Pt reports that things are about the same.    Pertinent History  osteo arthritis, hx cellulitis, phlebitis and history of edema in BLE, Hx Acute kidney injury, CHF and DM II    Patient Stated Goals  I want to get this swelling down and heal my wound.    Currently in Pain?  Yes    Pain Score  8     Pain Location  Knee    Pain Orientation  Right;Left    Pain Descriptors / Indicators  Aching    Pain Type  Chronic pain    Pain Onset  More than a month ago    Pain Frequency  Intermittent    Aggravating Factors   movement    Pain Relieving Factors  rest                        [facility] Adult PT Treatment/Exercise - 08/31/19 0001      Manual Therapy   Manual Therapy  Compression Bandaging;Edema management    Manual therapy comments  2 allevyn were used on the R lateral lower leg, 4 allevyn were used on the L lateral/posterior lower leg, pt lower extremities were washed with warm water,  rinsed and dressed prior to compression wrap. ABD pads covered allevyn pads and then held in place with Kerlex.     Edema Management  folded edema wear at bil lower legs with liner under edema wear to prevent rubbing. folded Size K tubigrip at bil thighs    Compression Bandaging  3 short stretch bandages over liner, edema wear             PT Education - 08/31/19 1715    Education Details  Pt will continue with what she is doing at home until she is able to get appropriate compression garments.    Person(s) Educated  Patient    Methods  Explanation    Comprehension  Verbalized understanding       PT Short Term Goals - 08/15/19 1358      PT SHORT TERM GOAL #1   Title  Pt will be independent with HEP    Baseline  Pt is working on her HEP occasionally at home.    Status  On-going    Target Date  08/24/19        PT Long Term Goals - 08/15/19 1358      PT LONG TERM GOAL #1    Title  Patient will have reduction of L lower leg limb girth by 3 cm    Baseline  Pt continues with fluctuations in edema.    Status  On-going    Target Date  08/24/19      PT LONG TERM GOAL #2   Title  Patient to be properly fitted with compression garment to wear on daily basis.    Baseline  due to pt mobility/financial issues it has been difficult to find something that is appropriate for patient    Status  On-going    Target Date  08/24/19      PT LONG TERM GOAL #3   Title  Patient will be independent in self-care management principles including self-massage/bandaging and long term management plan for edema.    Baseline  Pt continues to struggle with managing her lymphedema.    Status  On-going    Target Date  08/24/19            Plan - 08/31/19 1406    Clinical Impression Statement  Pt skin has improved since the last session. Discussed the importance of continuously coming to physical therapy until she has appropriate garments due to every time pt misses an appointment she has skin impairments. Pt has multiple issues that are impeding appropriate garment including financial, mobility, strenght, flexibility, incontinence. Pt skin was cleaned and dressed this session using Allevyn over bil lateral legs with more on the L than the R covered with ABD pads and held in place with kerlex. Doubled edema wera was placed on bil lower legs and doubled tubigrip on bil thighs. Pt was then wrapped with short stretch bandages on the Bil lower legs. Pt will benefit from continued POC at this time.    Personal Factors and Comorbidities  Age;Comorbidity 3+    Comorbidities  CHF, acute kidney injury, DM II    Rehab Potential  Good    PT Frequency  2x / week    PT Duration  Other (comment)    PT Treatment/Interventions  Electrical Stimulation;Cryotherapy;Gait training;Stair training;Functional mobility training;Therapeutic activities;Therapeutic exercise;Balance training;Neuromuscular  re-education;Patient/family education;Manual lymph drainage;Compression bandaging;Passive range of motion;Taping;Vasopneumatic Device    PT Next Visit Plan  Continue with wrapping, help order garments if needed. Ask if she has spoke  to social worker?    PT Home Exercise Plan  pump and exercises    Consulted and Agree with Plan of Care  Patient       Patient will benefit from skilled therapeutic intervention in order to improve the following deficits and impairments:  Difficulty walking, Increased edema  Visit Diagnosis: Lymphedema  Other abnormalities of gait and mobility     Problem List Patient Active Problem List   Diagnosis Date Noted  . Allergic rhinitis 07/28/2019  . Postnasal drip 11/16/2017  . Sensorineural hearing loss (SNHL), bilateral 11/16/2017  . Tinnitus of both ears 11/16/2017  . Obesity, Class II, BMI 35-39.9, isolated (see actual BMI) 11/08/2017  . CKD (chronic kidney disease), stage III 11/08/2017  . Anemia 11/07/2017  . Thrombocytosis ([place]) 11/07/2017  . Lymphedema 11/07/2017  . Routine general medical examination at a health care facility 05/10/2014  . Paroxysmal SVT (supraventricular tachycardia) ([place]) 11/28/2013  . Physical deconditioning 11/23/2013  . Generalized weakness 11/17/2013  . Nausea 11/17/2013  . Multilevel spine pain 11/17/2013  . Aortic stenosis, mild 11/17/2013  . DM type 2 (diabetes mellitus, type 2) ([place]) 02/02/2013  . Arthritis 10/19/2012  . Mixed incontinence 10/19/2012  . GERD (gastroesophageal reflux disease) 04/20/2011  . HTN (hypertension) 01/20/2011  . Morbid obesity ([place])     Ander Purpura, PT 08/31/2019, 5:20 PM  [place], [place], [address] Phone: [phone]   Fax:  [phone]  Name: Winnie Cisney MRN: [id number] Date of Birth: Jul 31, 1945

## 2019-09-05 ENCOUNTER — Other Ambulatory Visit: Payer: Self-pay

## 2019-09-05 ENCOUNTER — Ambulatory Visit: Payer: PPO | Attending: Internal Medicine

## 2019-09-05 DIAGNOSIS — R2689 Other abnormalities of gait and mobility: Secondary | ICD-10-CM | POA: Insufficient documentation

## 2019-09-05 DIAGNOSIS — I890 Lymphedema, not elsewhere classified: Secondary | ICD-10-CM | POA: Diagnosis not present

## 2019-09-05 NOTE — Therapy (Signed)
[place], [place], [address] Phone: [phone]   Fax:  [phone]  Physical Therapy Progress Note  Progress Note Reporting Period 07/20/19 to 09/05/19  See note below for Objective Data and Assessment of Progress/Goals.       Patient Details  Name: [name] [name] MRN: [id number] Date of Birth: March 04, 1946 Referring Provider (PT): Darci Current   Encounter Date: 09/05/2019  PT End of Session - 09/05/19 1109    Visit Number  45    Number of Visits  53    Date for PT Re-Evaluation  10/10/19    PT Start Time  1107    PT Stop Time  1200    PT Time Calculation (min)  53 min    Activity Tolerance  Patient tolerated treatment well    Behavior During Therapy  [facility] for tasks assessed/performed       Past Medical History:  Diagnosis Date  . Acute kidney injury ([place])   . Aortic stenosis, mild 11/17/2013  . Arthritis    "both knees, spine, back, fingers" (11/29/2013)  . CHF (congestive heart failure) ([place])   . Edema   . GERD (gastroesophageal reflux disease)   . Heart murmur   . History of diastolic dysfunction    Echo 123XX123 with diastolic dysfunction  . HTN (hypertension)   . Morbid obesity ([place])   . OA (osteoarthritis)   . PAC (premature atrial contraction)    per prior Holter  . Palpitations   . Pneumonia    "as a child"  . PVC's (premature ventricular contractions)    per prior Holter  . SVT (supraventricular tachycardia) ([place])   . Tachycardia    noted at 07/28/11 visit. Started on beta blocker. Possible atrial flutter vs long PR tachycardia/AVNRT  . Type II diabetes mellitus ([place])     Past Surgical History:  Procedure Laterality Date  . CESAREAN SECTION  1985  . CESAREAN SECTION  1985  . SUPRAVENTRICULAR TACHYCARDIA ABLATION  11/29/2013  . SUPRAVENTRICULAR TACHYCARDIA ABLATION N/A 11/29/2013   Procedure: SUPRAVENTRICULAR TACHYCARDIA ABLATION;  Surgeon: Evans Lance, MD;  Location:  [facility] CATH LAB;  Service: Cardiovascular;  Laterality: N/A;  . US ECHOCARDIOGRAPHY  07/25/2008   EF 55-60%  . VAGINAL DELIVERY      There were no vitals filed for this visit.  Subjective Assessment - 09/05/19 1109    Subjective  Pt states that her wrap came off of her R leg yesterday becuase it got really loose so she removed it. Otherwise she felt okay.    Pertinent History  osteo arthritis, hx cellulitis, phlebitis and history of edema in BLE, Hx Acute kidney injury, CHF and DM II    Patient Stated Goals  I want to get this swelling down and heal my wound.    Currently in Pain?  Yes    Pain Score  8     Pain Location  Knee    Pain Orientation  Right;Left    Pain Descriptors / Indicators  Aching    Pain Type  Chronic pain    Pain Onset  More than a month ago    Pain Frequency  Intermittent    Aggravating Factors   movement    Pain Relieving Factors  rest            LYMPHEDEMA/ONCOLOGY QUESTIONNAIRE - 09/05/19 1129      Right Lower Extremity Lymphedema   10 cm Proximal to Suprapatella  67.4 cm  At Midpatella/Popliteal Crease  53 cm    30 cm Proximal to Floor at Lateral Plantar Foot  59.3 cm    20 cm Proximal to Floor at Lateral Plantar Foot  48 1    10 cm Proximal to Floor at Lateral Malleoli  35 cm    5 cm Proximal to 1st MTP Joint  23.8 cm    Across MTP Joint  24.4 cm    Around Proximal Great Toe  9.8 cm      Left Lower Extremity Lymphedema   20 cm Proximal to Suprapatella  72.5 cm    10 cm Proximal to Suprapatella  70 cm    At Midpatella/Popliteal Crease  53.5 cm    30 cm Proximal to Floor at Lateral Plantar Foot  53.7 cm    20 cm Proximal to Floor at Lateral Plantar Foot  42 cm    10 cm Proximal to Floor at Lateral Malleoli  30.5 cm    5 cm Proximal to 1st MTP Joint  24.5 cm    Around Proximal Great Toe  10.5 cm                 [facility] Adult PT Treatment/Exercise - 09/05/19 0001      Manual Therapy   Manual Therapy  Compression Bandaging;Edema  management    Manual therapy comments  2 allevyn were used on the R lateral lower leg, 2 allevyn were used on the L lateral/posterior lower leg, pt lower extremities were washed with warm water, rinsed and dressed prior to compression wrap. ABD pads covered allevyn pads and then held in place with Kerlex.     Edema Management  folded edema wear at bil lower legs with liner under edema wear to prevent rubbing. One Layer Size K tubigrip at bil thighs    Compression Bandaging  2 short stretch bandages over liner, edema wear             PT Education - 09/05/19 1200    Education Details  Pt will continue with what she id doing at home until she is able to get the appropriate compression garments.    Person(s) Educated  Patient    Methods  Explanation    Comprehension  Verbalized understanding       PT Short Term Goals - 09/05/19 1206      PT SHORT TERM GOAL #1   Title  Pt will be independent with HEP    Baseline  pt continues to work on her HEP occasionally at home.    Time  4    Period  Weeks    Status  On-going    Target Date  10/10/19        PT Long Term Goals - 09/05/19 1206      PT LONG TERM GOAL #1   Title  Patient will have reduction of L lower leg limb girth by 3 cm    Baseline  Pt continues with fluctuations in edema.    Time  4    Period  Weeks    Status  On-going    Target Date  10/10/19      PT LONG TERM GOAL #2   Title  Patient to be properly fitted with compression garment to wear on daily basis.    Baseline  due to pt mobility/financial issues it has been difficult to find something that is appropriate for patient    Time  4    Period  Weeks  Status  On-going    Target Date  10/10/19      PT LONG TERM GOAL #3   Title  Patient will be independent in self-care management principles including self-massage/bandaging and long term management plan for edema.    Baseline  Pt continues to struggle with managing her lymphedema.    Time  4    Period  Weeks     Status  On-going    Target Date  10/10/19            Plan - 09/05/19 1109    Clinical Impression Statement  Pt skin continues to improve; she does continue with weeping and skin sloughing that gets wet and stays on the skin under the wraps. Noted thick dry skin under the metatarsal heads on the L plantar aspect of the foot; it came off with slight pressure but only pieces that came off easily were removed. Possibly something pt stepped in or thick skin but due to the speed at which this developed It seems unlikely to be skin. Pt was notified and encouraged to keep an eye on it if she can. Pt circumferential measurements were taken this session and she continues to remain less than initially but fluctuates. She has multiple issues that are impeding appropriate garment including financial issues, mobility, strength, flexibility, incontinence, health literacy. Pt skin was cleaned and dressed this session using allevyn over bil lateral legs with the same amount on bil lower legs this session. COvered with ABD pads and held in place. Edema wear was doubled over the bil lower legs and single layer tubigrip on bil thighs. Pt was wrapped with less short stretch bandages this session to see if she continues to decrease working toward only edema wear and tubigrip. Pt will benefit from continued skilled physical therapy services 2x/week for 4 weeks in order to decrease risk for hospitalization related to edema, wounds or infection.    Personal Factors and Comorbidities  Age;Comorbidity 3+    Comorbidities  CHF, acute kidney injury, DM II    Rehab Potential  Good    PT Frequency  2x / week    PT Duration  Other (comment)    PT Treatment/Interventions  Electrical Stimulation;Cryotherapy;Gait training;Stair training;Functional mobility training;Therapeutic activities;Therapeutic exercise;Balance training;Neuromuscular re-education;Patient/family education;Manual lymph drainage;Compression bandaging;Passive  range of motion;Taping;Vasopneumatic Device    PT Next Visit Plan  Continue with wrapping, help order garments if needed. Ask if she has spoke to Education officer, museum?    PT Home Exercise Plan  pump and exercises    Consulted and Agree with Plan of Care  Patient       Patient will benefit from skilled therapeutic intervention in order to improve the following deficits and impairments:  Difficulty walking, Increased edema  Visit Diagnosis: Lymphedema  Other abnormalities of gait and mobility     Problem List Patient Active Problem List   Diagnosis Date Noted  . Allergic rhinitis 07/28/2019  . Postnasal drip 11/16/2017  . Sensorineural hearing loss (SNHL), bilateral 11/16/2017  . Tinnitus of both ears 11/16/2017  . Obesity, Class II, BMI 35-39.9, isolated (see actual BMI) 11/08/2017  . CKD (chronic kidney disease), stage III 11/08/2017  . Anemia 11/07/2017  . Thrombocytosis ([place]) 11/07/2017  . Lymphedema 11/07/2017  . Routine general medical examination at a health care facility 05/10/2014  . Paroxysmal SVT (supraventricular tachycardia) ([place]) 11/28/2013  . Physical deconditioning 11/23/2013  . Generalized weakness 11/17/2013  . Nausea 11/17/2013  . Multilevel spine pain 11/17/2013  . Aortic stenosis,  mild 11/17/2013  . DM type 2 (diabetes mellitus, type 2) ([place]) 02/02/2013  . Arthritis 10/19/2012  . Mixed incontinence 10/19/2012  . GERD (gastroesophageal reflux disease) 04/20/2011  . HTN (hypertension) 01/20/2011  . Morbid obesity [facility])     Ander Purpura, PT 09/05/2019, 12:07 PM  [place], [place], [address] Phone: [phone]   Fax:  [phone]  Name: [name] [name] MRN: [id number] Date of Birth: 08/19/1945

## 2019-09-07 ENCOUNTER — Ambulatory Visit: Payer: PPO

## 2019-09-07 ENCOUNTER — Other Ambulatory Visit: Payer: Self-pay

## 2019-09-07 DIAGNOSIS — I890 Lymphedema, not elsewhere classified: Secondary | ICD-10-CM

## 2019-09-07 DIAGNOSIS — R2689 Other abnormalities of gait and mobility: Secondary | ICD-10-CM

## 2019-09-07 NOTE — Therapy (Signed)
[name] [name], [name] [name], [address] Phone: [phone]   Fax:  [phone]  Physical Therapy Treatment  Patient Details  Name: [name] [name] MRN: [id number] Date of Birth: 18-Mar-1946 Referring Provider (PT): Darci Current   Encounter Date: 09/07/2019  PT End of Session - 09/07/19 [id number]    Visit Number  46    Number of Visits  53    Date for PT Re-Evaluation  10/10/19    PT Start Time  1200    PT Stop Time  1242    PT Time Calculation (min)  42 min    Activity Tolerance  Patient tolerated treatment well    Behavior During Therapy  [facility] for tasks assessed/performed       Past Medical History:  Diagnosis Date  . Acute kidney injury ([place])   . Aortic stenosis, mild 11/17/2013  . Arthritis    "both knees, spine, back, fingers" (11/29/2013)  . CHF (congestive heart failure) ([place])   . Edema   . GERD (gastroesophageal reflux disease)   . Heart murmur   . History of diastolic dysfunction    Echo [id number] with diastolic dysfunction  . HTN (hypertension)   . Morbid obesity ([place])   . OA (osteoarthritis)   . PAC (premature atrial contraction)    per prior Holter  . Palpitations   . Pneumonia    "as a child"  . PVC's (premature ventricular contractions)    per prior Holter  . SVT (supraventricular tachycardia) ([place])   . Tachycardia    noted at 07/28/11 visit. Started on beta blocker. Possible atrial flutter vs long PR tachycardia/AVNRT  . Type II diabetes mellitus ([place])     Past Surgical History:  Procedure Laterality Date  . CESAREAN SECTION  1985  . CESAREAN SECTION  1985  . SUPRAVENTRICULAR TACHYCARDIA ABLATION  11/29/2013  . SUPRAVENTRICULAR TACHYCARDIA ABLATION N/A 11/29/2013   Procedure: SUPRAVENTRICULAR TACHYCARDIA ABLATION;  Surgeon: Evans Lance, MD;  Location: [facility] CATH LAB;  Service: Cardiovascular;  Laterality: N/A;  . US ECHOCARDIOGRAPHY  07/25/2008   EF 55-60%  . VAGINAL DELIVERY       There were no vitals filed for this visit.  Subjective Assessment - 09/07/19 1247    Subjective  Pt states that she cannot pay a $30 co-pay due to finances. She is okay with coming today but this is too expensive.                        [facility] Adult PT Treatment/Exercise - 09/07/19 0001      Manual Therapy   Manual Therapy  Edema management    Manual therapy comments  1 allevyn used over open area due to pt scratching her L proximal lower leg, 2 ABD pads on the R and 3 ABD pads on the L fixed with Kerlex to catch extra fluid if needed over areas of sensitive delicate skin.     Edema Management  folded edema wear at bil lower legs with liner underneath to prevent pt from scratching.              PT Education - 09/07/19 1244    Education Details  Pt is going to practice donning edema wear at home. She will pump but without edema wear on and keep open areas covered wtih ABD pads. She will use lining under the edema wear to help decrease itching.    Person(s) Educated  Patient    Methods  Explanation    Comprehension  Verbalized understanding       PT Short Term Goals - 09/05/19 1206      PT SHORT TERM GOAL #1   Title  Pt will be independent with HEP    Baseline  pt continues to work on her HEP occasionally at home.    Time  4    Period  Weeks    Status  On-going    Target Date  10/10/19        PT Long Term Goals - 09/05/19 1206      PT LONG TERM GOAL #1   Title  Patient will have reduction of L lower leg limb girth by 3 cm    Baseline  Pt continues with fluctuations in edema.    Time  4    Period  Weeks    Status  On-going    Target Date  10/10/19      PT LONG TERM GOAL #2   Title  Patient to be properly fitted with compression garment to wear on daily basis.    Baseline  due to pt mobility/financial issues it has been difficult to find something that is appropriate for patient    Time  4    Period  Weeks    Status  On-going    Target Date   10/10/19      PT LONG TERM GOAL #3   Title  Patient will be independent in self-care management principles including self-massage/bandaging and long term management plan for edema.    Baseline  Pt continues to struggle with managing her lymphedema.    Time  4    Period  Weeks    Status  On-going    Target Date  10/10/19            Plan - 09/07/19 1244    Clinical Impression Statement  Pt presents with her wraps still in place. Open area noted in her proximal L lower leg due to pt scratching her leg; she has been educated multiple times not to scratch to try tapping/rubbing or adjusting bandages. Re-iterated education not to scratch. This session dischssed discharge with patient. She has a vasopneumatic pump, edema wear for bil lower legs and tubigrip for bil thighs. She will practice donning the edema wear at home over liner to protect skin due to pt skin is very delicate. Pt continues to required ABD pads over the lower legs to protect the skin and was advised that possible visits with the wound clinic may be necessary to help with skin issues. Pt will not pump with the edema wear on to protect skin and wear the liner when pumping and washing edema wear. She will don edema wear first thing in the morning or after pumping. Pt will benefit from continued skilled therapy services for 1-2 more visits and then needs discharge to home management. If pt continues with difficulty with skin she may need to return to wound clinic.    Personal Factors and Comorbidities  Age;Comorbidity 3+    Comorbidities  CHF, acute kidney injury, DM II    Rehab Potential  Good    PT Frequency  2x / week    PT Duration  Other (comment)    PT Treatment/Interventions  Electrical Stimulation;Cryotherapy;Gait training;Stair training;Functional mobility training;Therapeutic activities;Therapeutic exercise;Balance training;Neuromuscular re-education;Patient/family education;Manual lymph drainage;Compression  bandaging;Passive range of motion;Taping;Vasopneumatic Device    PT Next Visit Plan  Continue with wrapping, help order garments if needed. Ask if she  has spoke to Education officer, museum?    PT Home Exercise Plan  pump and exercises    Consulted and Agree with Plan of Care  Patient       Patient will benefit from skilled therapeutic intervention in order to improve the following deficits and impairments:  Difficulty walking, Increased edema  Visit Diagnosis: Lymphedema  Other abnormalities of gait and mobility     Problem List Patient Active Problem List   Diagnosis Date Noted  . Allergic rhinitis 07/28/2019  . Postnasal drip 11/16/2017  . Sensorineural hearing loss (SNHL), bilateral 11/16/2017  . Tinnitus of both ears 11/16/2017  . Obesity, Class II, BMI 35-39.9, isolated (see actual BMI) 11/08/2017  . CKD (chronic kidney disease), stage III 11/08/2017  . Anemia 11/07/2017  . Thrombocytosis ([place]) 11/07/2017  . Lymphedema 11/07/2017  . Routine general medical examination at a health care facility 05/10/2014  . Paroxysmal SVT (supraventricular tachycardia) ([place]) 11/28/2013  . Physical deconditioning 11/23/2013  . Generalized weakness 11/17/2013  . Nausea 11/17/2013  . Multilevel spine pain 11/17/2013  . Aortic stenosis, mild 11/17/2013  . DM type 2 (diabetes mellitus, type 2) ([place]) 02/02/2013  . Arthritis 10/19/2012  . Mixed incontinence 10/19/2012  . GERD (gastroesophageal reflux disease) 04/20/2011  . HTN (hypertension) 01/20/2011  . Morbid obesity [facility])     Ander Purpura, PT 09/07/2019, 12:52 PM  [place], [name] [name], [address] Phone: [phone]   Fax:  [phone]  Name: [name] [name] MRN: [id number] Date of Birth: 11/16/1945

## 2019-09-12 ENCOUNTER — Other Ambulatory Visit: Payer: Self-pay

## 2019-09-12 ENCOUNTER — Ambulatory Visit: Payer: PPO

## 2019-09-12 DIAGNOSIS — I890 Lymphedema, not elsewhere classified: Secondary | ICD-10-CM

## 2019-09-12 DIAGNOSIS — R2689 Other abnormalities of gait and mobility: Secondary | ICD-10-CM

## 2019-09-12 NOTE — Therapy (Signed)
[place], [place], [address] Phone: [phone]   Fax:  [phone]  Physical Therapy Treatment  Patient Details  Name: [name] [name] MRN: [id number] Date of Birth: 1945/12/13 Referring Provider (PT): Darci Current   Encounter Date: 09/12/2019  PT End of Session - 09/12/19 1344    Visit Number  63    Number of Visits  53    Date for PT Re-Evaluation  10/10/19    PT Start Time  1300    PT Stop Time  1330    PT Time Calculation (min)  30 min    Activity Tolerance  Patient tolerated treatment well    Behavior During Therapy  [facility] for tasks assessed/performed       Past Medical History:  Diagnosis Date  . Acute kidney injury ([place])   . Aortic stenosis, mild 11/17/2013  . Arthritis    "both knees, spine, back, fingers" (11/29/2013)  . CHF (congestive heart failure) ([place])   . Edema   . GERD (gastroesophageal reflux disease)   . Heart murmur   . History of diastolic dysfunction    Echo 06/8180 with diastolic dysfunction  . HTN (hypertension)   . Morbid obesity ([place])   . OA (osteoarthritis)   . PAC (premature atrial contraction)    per prior Holter  . Palpitations   . Pneumonia    "as a child"  . PVC's (premature ventricular contractions)    per prior Holter  . SVT (supraventricular tachycardia) ([place])   . Tachycardia    noted at 07/28/11 visit. Started on beta blocker. Possible atrial flutter vs long PR tachycardia/AVNRT  . Type II diabetes mellitus ([place])     Past Surgical History:  Procedure Laterality Date  . CESAREAN SECTION  1985  . CESAREAN SECTION  1985  . SUPRAVENTRICULAR TACHYCARDIA ABLATION  11/29/2013  . SUPRAVENTRICULAR TACHYCARDIA ABLATION N/A 11/29/2013   Procedure: SUPRAVENTRICULAR TACHYCARDIA ABLATION;  Surgeon: Evans Lance, MD;  Location: [facility] CATH LAB;  Service: Cardiovascular;  Laterality: N/A;  . US ECHOCARDIOGRAPHY  07/25/2008   EF 55-60%  . VAGINAL DELIVERY       There were no vitals filed for this visit.  Subjective Assessment - 09/12/19 1339    Subjective  Pt states that she took the edema wear off on Saturday and was concerned about trying to pull it up over her bandage due to she is pulling from a different direction than I am when I apply it.    Pertinent History  osteo arthritis, hx cellulitis, phlebitis and history of edema in BLE, Hx Acute kidney injury, CHF and DM II    Patient Stated Goals  I want to get this swelling down and heal my wound.    Currently in Pain?  Yes    Pain Score  8     Pain Location  Knee    Pain Orientation  Right;Left    Pain Descriptors / Indicators  Aching    Pain Onset  More than a month ago    Pain Frequency  Intermittent    Aggravating Factors   movement    Pain Relieving Factors  rest                        [facility] Adult PT Treatment/Exercise - 09/12/19 0001      Manual Therapy   Manual Therapy  Edema management    Manual therapy comments  ABD pads used over the lower  legs over areas of maceration and soft skin. 1x non-adhesive pad over healing area on the posterior/latearl proximal L lower leg. Isoband used over the ABD pads to hold in place and to protect delicate skin.     Edema Management  folded edema wear donned on the L lower leg from the direction pt would be pulling to demonstrate technique. Pt donned edema wear folded over the R lower leg. 1 layer of tubigrip donned over bil thighs.              PT Education - 09/12/19 1342    Education Details  Re-iterated education that pt always needs compression on her lower legs if she has compression on her thighs. Pt educated on how to don her edema wear over the ABD pads placed over soft/weeping areas. Re-iterated education on importance of pt continueing with vasopneumatic pump. Pt is discouraged becuase she continues with swelling in her legs she has been educated that the pump is helping her legs not get any larger and on the fact  that lymphedema is a chronic on-going condition.    Person(s) Educated  Patient    Methods  Explanation    Comprehension  Verbalized understanding       PT Short Term Goals - 09/05/19 1206      PT SHORT TERM GOAL #1   Title  Pt will be independent with HEP    Baseline  pt continues to work on her HEP occasionally at home.    Time  4    Period  Weeks    Status  On-going    Target Date  10/10/19        PT Long Term Goals - 09/05/19 1206      PT LONG TERM GOAL #1   Title  Patient will have reduction of L lower leg limb girth by 3 cm    Baseline  Pt continues with fluctuations in edema.    Time  4    Period  Weeks    Status  On-going    Target Date  10/10/19      PT LONG TERM GOAL #2   Title  Patient to be properly fitted with compression garment to wear on daily basis.    Baseline  due to pt mobility/financial issues it has been difficult to find something that is appropriate for patient    Time  4    Period  Weeks    Status  On-going    Target Date  10/10/19      PT LONG TERM GOAL #3   Title  Patient will be independent in self-care management principles including self-massage/bandaging and long term management plan for edema.    Baseline  Pt continues to struggle with managing her lymphedema.    Time  4    Period  Weeks    Status  On-going    Target Date  10/10/19            Plan - 09/12/19 1344    Clinical Impression Statement  Pt presents with tubigrip over the lower legs and thighs; she had removed edema wear as instructed and states she tried to don but was concerned that she is at a different angle and will mess up the dressings on the lower legs. Pt was demonstrated from her angle how to don the edemawear and she performed donning on the R LE. She has slight increase in skin maceration and weeping from last session but has not had  significant compression on her legs since Saturday demonstrating improvement. Pt reports that she continues to use the  vasopneumatic pump but is discouraged becuase she feels her legs are not getting better; pt was educated that her legs are not getting worse either which is a good sign. ABD pads placed over macerated/weeping areas and non-adhesive dressing over the open area on the L proximal lateral/posterior leg that was noted last session; it has healed significantly since last session. She will continue to practice with the edema wear and will continue using the pump following re-iteration of education on importance of continuous management of lymphedema secondary to this is a chronic progressive condition. PT will benefit from continued POC at this time for possibly 1 more visit as long as pt is able to manage edema wear herself.    Personal Factors and Comorbidities  Age;Comorbidity 3+    Comorbidities  CHF, acute kidney injury, DM II, health literacy    Rehab Potential  Good    PT Frequency  2x / week    PT Duration  Other (comment)    PT Treatment/Interventions  Electrical Stimulation;Cryotherapy;Gait training;Stair training;Functional mobility training;Therapeutic activities;Therapeutic exercise;Balance training;Neuromuscular re-education;Patient/family education;Manual lymph drainage;Compression bandaging;Passive range of motion;Taping;Vasopneumatic Device    PT Next Visit Plan  Continue with wrapping, help order garments if needed. Ask if she has spoke to Education officer, museum?    PT Home Exercise Plan  pump and exercises keeping compression on the legs.    Consulted and Agree with Plan of Care  Patient       Patient will benefit from skilled therapeutic intervention in order to improve the following deficits and impairments:  Difficulty walking, Increased edema  Visit Diagnosis: Lymphedema  Other abnormalities of gait and mobility     Problem List Patient Active Problem List   Diagnosis Date Noted  . Allergic rhinitis 07/28/2019  . Postnasal drip 11/16/2017  . Sensorineural hearing loss (SNHL),  bilateral 11/16/2017  . Tinnitus of both ears 11/16/2017  . Obesity, Class II, BMI 35-39.9, isolated (see actual BMI) 11/08/2017  . CKD (chronic kidney disease), stage III 11/08/2017  . Anemia 11/07/2017  . Thrombocytosis ([place]) 11/07/2017  . Lymphedema 11/07/2017  . Routine general medical examination at a health care facility 05/10/2014  . Paroxysmal SVT (supraventricular tachycardia) ([place]) 11/28/2013  . Physical deconditioning 11/23/2013  . Generalized weakness 11/17/2013  . Nausea 11/17/2013  . Multilevel spine pain 11/17/2013  . Aortic stenosis, mild 11/17/2013  . DM type 2 (diabetes mellitus, type 2) ([place]) 02/02/2013  . Arthritis 10/19/2012  . Mixed incontinence 10/19/2012  . GERD (gastroesophageal reflux disease) 04/20/2011  . HTN (hypertension) 01/20/2011  . Morbid obesity ([place])     Ander Purpura, PT 09/12/2019, 1:49 PM  [place], [place], [address] Phone: [phone]   Fax:  [phone]  Name: [name] [name] MRN: [id number] Date of Birth: 12-Oct-1945

## 2019-09-14 ENCOUNTER — Ambulatory Visit: Payer: PPO

## 2019-09-14 ENCOUNTER — Other Ambulatory Visit: Payer: Self-pay

## 2019-09-14 DIAGNOSIS — I890 Lymphedema, not elsewhere classified: Secondary | ICD-10-CM | POA: Diagnosis not present

## 2019-09-14 DIAGNOSIS — R2689 Other abnormalities of gait and mobility: Secondary | ICD-10-CM

## 2019-09-14 NOTE — Therapy (Signed)
[place], [place], [address] Phone: [phone]   Fax:  [phone]  Physical Therapy Treatment  Patient Details  Name: [name] [name] MRN: [id number] Date of Birth: Jul 14, 1945 Referring Provider (PT): Darci Current   Encounter Date: 09/14/2019   PT End of Session - 09/14/19 1300    Visit Number 48    Number of Visits 53    Date for PT Re-Evaluation 10/10/19    PT Start Time 1300    PT Stop Time 1330    PT Time Calculation (min) 30 min    Activity Tolerance Patient tolerated treatment well    Behavior During Therapy [facility] for tasks assessed/performed           Past Medical History:  Diagnosis Date  . Acute kidney injury ([place])   . Aortic stenosis, mild 11/17/2013  . Arthritis    "both knees, spine, back, fingers" (11/29/2013)  . CHF (congestive heart failure) ([place])   . Edema   . GERD (gastroesophageal reflux disease)   . Heart murmur   . History of diastolic dysfunction    Echo 07/4008 with diastolic dysfunction  . HTN (hypertension)   . Morbid obesity ([place])   . OA (osteoarthritis)   . PAC (premature atrial contraction)    per prior Holter  . Palpitations   . Pneumonia    "as a child"  . PVC's (premature ventricular contractions)    per prior Holter  . SVT (supraventricular tachycardia) ([place])   . Tachycardia    noted at 07/28/11 visit. Started on beta blocker. Possible atrial flutter vs long PR tachycardia/AVNRT  . Type II diabetes mellitus ([place])     Past Surgical History:  Procedure Laterality Date  . CESAREAN SECTION  1985  . CESAREAN SECTION  1985  . SUPRAVENTRICULAR TACHYCARDIA ABLATION  11/29/2013  . SUPRAVENTRICULAR TACHYCARDIA ABLATION N/A 11/29/2013   Procedure: SUPRAVENTRICULAR TACHYCARDIA ABLATION;  Surgeon: Evans Lance, MD;  Location: [facility] CATH LAB;  Service: Cardiovascular;  Laterality: N/A;  . US ECHOCARDIOGRAPHY  07/25/2008   EF 55-60%  . VAGINAL DELIVERY       There were no vitals filed for this visit.   Subjective Assessment - 09/14/19 1300    Subjective Pt states that she has been pumping for 2 hours at night to help with her swelling. She has been removing her edema wear when pumping.    Pertinent History osteo arthritis, hx cellulitis, phlebitis and history of edema in BLE, Hx Acute kidney injury, CHF and DM II    Patient Stated Goals I want to get this swelling down and heal my wound.    Currently in Pain? Yes    Pain Score 8     Pain Location Knee    Pain Orientation Right;Left    Pain Descriptors / Indicators Aching    Pain Type Chronic pain    Pain Onset More than a month ago    Pain Frequency Intermittent    Aggravating Factors  movement    Pain Relieving Factors rest                             [facility] Adult PT Treatment/Exercise - 09/14/19 0001      Manual Therapy   Manual Therapy Edema management    Manual therapy comments ABD pads used over the lateral lower legs with one loosely wrapped isoband over the top  following skin care.  Edema Management folded edema wear over bil loewr legs pt instructed to put tubigrip over the thighs once it dries.                   PT Education - 09/14/19 1338    Education Details Pt donned edema wear on the RLE independently this session then assisted with donning on the L LE. She was educated to continue pumping and to put tubigrip over the bil thighs once it dries due to pt dryer is broken.    Person(s) Educated Patient    Methods Explanation    Comprehension Verbalized understanding            PT Short Term Goals - 09/05/19 1206      PT SHORT TERM GOAL #1   Title Pt will be independent with HEP    Baseline pt continues to work on her HEP occasionally at home.    Time 4    Period Weeks    Status On-going    Target Date 10/10/19             PT Long Term Goals - 09/05/19 1206      PT LONG TERM GOAL #1   Title Patient will have reduction of  L lower leg limb girth by 3 cm    Baseline Pt continues with fluctuations in edema.    Time 4    Period Weeks    Status On-going    Target Date 10/10/19      PT LONG TERM GOAL #2   Title Patient to be properly fitted with compression garment to wear on daily basis.    Baseline due to pt mobility/financial issues it has been difficult to find something that is appropriate for patient    Time 4    Period Weeks    Status On-going    Target Date 10/10/19      PT LONG TERM GOAL #3   Title Patient will be independent in self-care management principles including self-massage/bandaging and long term management plan for edema.    Baseline Pt continues to struggle with managing her lymphedema.    Time 4    Period Weeks    Status On-going    Target Date 10/10/19                 Plan - 09/14/19 1259    Clinical Impression Statement Pt presents to physical therapy with tubigrip over her bil lower legs and she had removed the edema wear. Pt is beginning to significantly improve her autonomy with care of edema in the bil lower legs. She was able to don edema wear on the R lower leg this session without significant difficulty. Bil lower legs continues with soft skin and some drainage; ABD pads were used to cover loosely wrapped with isoband under edemawear. She continues to use the vasopneumatic pump and have discussed discharge with patient. She is going to continue to practice with the edema wear and using the pump. Pt will benefit from continued POC at this time for 1-2 more visits to demonstrate complete autonomy of care in order to prevent open wounds on the LE and to get pt one more pair of edema wear so she has a pair to wash and a pair to wear.    Personal Factors and Comorbidities Age;Comorbidity 3+    Comorbidities CHF, acute kidney injury, DM II, health literacy    Rehab Potential Good    PT Frequency 2x / week  PT Duration Other (comment)    PT Treatment/Interventions Electrical  Stimulation;Cryotherapy;Gait training;Stair training;Functional mobility training;Therapeutic activities;Therapeutic exercise;Balance training;Neuromuscular re-education;Patient/family education;Manual lymph drainage;Compression bandaging;Passive range of motion;Taping;Vasopneumatic Device    PT Next Visit Plan Continue with wrapping, help order garments if needed. Ask if she has spoke to Education officer, museum?    PT Home Exercise Plan pump and exercises keeping compression on the legs.    Consulted and Agree with Plan of Care Patient           Patient will benefit from skilled therapeutic intervention in order to improve the following deficits and impairments:  Difficulty walking, Increased edema  Visit Diagnosis: Lymphedema  Other abnormalities of gait and mobility     Problem List Patient Active Problem List   Diagnosis Date Noted  . Allergic rhinitis 07/28/2019  . Postnasal drip 11/16/2017  . Sensorineural hearing loss (SNHL), bilateral 11/16/2017  . Tinnitus of both ears 11/16/2017  . Obesity, Class II, BMI 35-39.9, isolated (see actual BMI) 11/08/2017  . CKD (chronic kidney disease), stage III 11/08/2017  . Anemia 11/07/2017  . Thrombocytosis ([place]) 11/07/2017  . Lymphedema 11/07/2017  . Routine general medical examination at a health care facility 05/10/2014  . Paroxysmal SVT (supraventricular tachycardia) ([place]) 11/28/2013  . Physical deconditioning 11/23/2013  . Generalized weakness 11/17/2013  . Nausea 11/17/2013  . Multilevel spine pain 11/17/2013  . Aortic stenosis, mild 11/17/2013  . DM type 2 (diabetes mellitus, type 2) ([place]) 02/02/2013  . Arthritis 10/19/2012  . Mixed incontinence 10/19/2012  . GERD (gastroesophageal reflux disease) 04/20/2011  . HTN (hypertension) 01/20/2011  . Morbid obesity ([place])     Ander Purpura, PT 09/14/2019, 1:42 PM  [place], [place], [address] Phone:  [phone]   Fax:  [phone]  Name: Jame Seelig MRN: [id number] Date of Birth: 1945-06-21

## 2019-09-19 ENCOUNTER — Other Ambulatory Visit: Payer: Self-pay

## 2019-09-19 ENCOUNTER — Ambulatory Visit: Payer: PPO

## 2019-09-19 DIAGNOSIS — R2689 Other abnormalities of gait and mobility: Secondary | ICD-10-CM

## 2019-09-19 DIAGNOSIS — I890 Lymphedema, not elsewhere classified: Secondary | ICD-10-CM | POA: Diagnosis not present

## 2019-09-19 NOTE — Therapy (Signed)
[place], [place], [address] Phone: [phone]   Fax:  [phone]  Physical Therapy Treatment  Patient Details  Name: [name] [name] MRN: [id number] Date of Birth: 1946/01/05 Referring Provider (PT): Darci Current   Encounter Date: 09/19/2019   PT End of Session - 09/19/19 1348    Visit Number 24    Number of Visits 53    Date for PT Re-Evaluation 10/10/19    PT Start Time 4401    PT Stop Time 1340    PT Time Calculation (min) 32 min    Activity Tolerance Patient tolerated treatment well    Behavior During Therapy [facility] for tasks assessed/performed           Past Medical History:  Diagnosis Date  . Acute kidney injury ([place])   . Aortic stenosis, mild 11/17/2013  . Arthritis    "both knees, spine, back, fingers" (11/29/2013)  . CHF (congestive heart failure) ([place])   . Edema   . GERD (gastroesophageal reflux disease)   . Heart murmur   . History of diastolic dysfunction    Echo 0/2725 with diastolic dysfunction  . HTN (hypertension)   . Morbid obesity ([place])   . OA (osteoarthritis)   . PAC (premature atrial contraction)    per prior Holter  . Palpitations   . Pneumonia    "as a child"  . PVC's (premature ventricular contractions)    per prior Holter  . SVT (supraventricular tachycardia) ([place])   . Tachycardia    noted at 07/28/11 visit. Started on beta blocker. Possible atrial flutter vs long PR tachycardia/AVNRT  . Type II diabetes mellitus ([place])     Past Surgical History:  Procedure Laterality Date  . CESAREAN SECTION  1985  . CESAREAN SECTION  1985  . SUPRAVENTRICULAR TACHYCARDIA ABLATION  11/29/2013  . SUPRAVENTRICULAR TACHYCARDIA ABLATION N/A 11/29/2013   Procedure: SUPRAVENTRICULAR TACHYCARDIA ABLATION;  Surgeon: Evans Lance, MD;  Location: [facility] CATH LAB;  Service: Cardiovascular;  Laterality: N/A;  . US ECHOCARDIOGRAPHY  07/25/2008   EF 55-60%  . VAGINAL DELIVERY       There were no vitals filed for this visit.   Subjective Assessment - 09/19/19 1345    Subjective Pt reports that her skin is gone on her R lower leg and she has covered it up wtih a sanitary napkin.    Pertinent History osteo arthritis, hx cellulitis, phlebitis and history of edema in BLE, Hx Acute kidney injury, CHF and DM II    Patient Stated Goals I want to get this swelling down and heal my wound.    Currently in Pain? Yes    Pain Score 8     Pain Location Knee    Pain Orientation Right;Left    Pain Descriptors / Indicators Aching    Pain Type Chronic pain    Pain Onset More than a month ago    Pain Frequency Intermittent    Aggravating Factors  movement    Pain Relieving Factors rest                             [facility] Adult PT Treatment/Exercise - 09/19/19 0001      Manual Therapy   Manual Therapy Edema management    Manual therapy comments ABD pads used over the lateral lower legs with one loosely wrapped isoband over the top  following skin care.     Edema Management folded  edema wear over the bil lower legs over dressings for the legs and then folded size K tubigrip over the bil thighs                  PT Education - 09/19/19 1347    Education Details Pt educated on how she can don ABD pads over the weeping/macerated areas on the lateral lower legs including taping two ABD pads together and then loosly covering with isoband and then liner followed by edema wear. Pt donned edema wear on the R lower leg    Person(s) Educated Patient    Methods Explanation    Comprehension Verbalized understanding            PT Short Term Goals - 09/05/19 1206      PT SHORT TERM GOAL #1   Title Pt will be independent with HEP    Baseline pt continues to work on her HEP occasionally at home.    Time 4    Period Weeks    Status On-going    Target Date 10/10/19             PT Long Term Goals - 09/05/19 1206      PT LONG TERM GOAL #1   Title  Patient will have reduction of L lower leg limb girth by 3 cm    Baseline Pt continues with fluctuations in edema.    Time 4    Period Weeks    Status On-going    Target Date 10/10/19      PT LONG TERM GOAL #2   Title Patient to be properly fitted with compression garment to wear on daily basis.    Baseline due to pt mobility/financial issues it has been difficult to find something that is appropriate for patient    Time 4    Period Weeks    Status On-going    Target Date 10/10/19      PT LONG TERM GOAL #3   Title Patient will be independent in self-care management principles including self-massage/bandaging and long term management plan for edema.    Baseline Pt continues to struggle with managing her lymphedema.    Time 4    Period Weeks    Status On-going    Target Date 10/10/19                 Plan - 09/19/19 1349    Clinical Impression Statement Pt presents with tubigrip over her bil lower legs and she has removed the edema wear and placed a sanitary napkin over her R lower leg. She has macerated skin from fluid with sloughed skin on the lateral lower legs bilaterally. Discussed with patient that she needs to change her ABD pads every other day to help dry out to decrease maceration and sloughing of the skin. ABD pads were used to cover the lower legs with loosely wrapped isoband under edemawear. Pt was able to don edema wear on her R lower leg. Pt was told to practice wrapping her leg tomorrow becuase she needs to be independent with this at home to stay out of the hospital. Continue with current POC at this time.    Personal Factors and Comorbidities Age;Comorbidity 3+    Comorbidities CHF, acute kidney injury, DM II, health literacy    Rehab Potential Good    PT Frequency 2x / week    PT Duration Other (comment)    PT Treatment/Interventions Electrical Stimulation;Cryotherapy;Gait training;Stair training;Functional mobility training;Therapeutic activities;Therapeutic  exercise;Balance  training;Neuromuscular re-education;Patient/family education;Manual lymph drainage;Compression bandaging;Passive range of motion;Taping;Vasopneumatic Device    PT Next Visit Plan Continue with wrapping, help order garments if needed. Ask if she has spoke to Education officer, museum?    PT Home Exercise Plan pump and exercises keeping compression on the legs.    Consulted and Agree with Plan of Care Patient           Patient will benefit from skilled therapeutic intervention in order to improve the following deficits and impairments:  Difficulty walking, Increased edema  Visit Diagnosis: Lymphedema  Other abnormalities of gait and mobility     Problem List Patient Active Problem List   Diagnosis Date Noted  . Allergic rhinitis 07/28/2019  . Postnasal drip 11/16/2017  . Sensorineural hearing loss (SNHL), bilateral 11/16/2017  . Tinnitus of both ears 11/16/2017  . Obesity, Class II, BMI 35-39.9, isolated (see actual BMI) 11/08/2017  . CKD (chronic kidney disease), stage III 11/08/2017  . Anemia 11/07/2017  . Thrombocytosis ([place]) 11/07/2017  . Lymphedema 11/07/2017  . Routine general medical examination at a health care facility 05/10/2014  . Paroxysmal SVT (supraventricular tachycardia) ([place]) 11/28/2013  . Physical deconditioning 11/23/2013  . Generalized weakness 11/17/2013  . Nausea 11/17/2013  . Multilevel spine pain 11/17/2013  . Aortic stenosis, mild 11/17/2013  . DM type 2 (diabetes mellitus, type 2) ([place]) 02/02/2013  . Arthritis 10/19/2012  . Mixed incontinence 10/19/2012  . GERD (gastroesophageal reflux disease) 04/20/2011  . HTN (hypertension) 01/20/2011  . Morbid obesity ([place])     Ander Purpura, PT 09/19/2019, 1:52 PM  [place], [place], [address] Phone: [phone]   Fax:  [phone]  Name: [name] [name] MRN: [id number] Date of Birth: 01-21-1946

## 2019-09-21 ENCOUNTER — Ambulatory Visit: Payer: PPO

## 2019-09-21 ENCOUNTER — Other Ambulatory Visit: Payer: Self-pay

## 2019-09-21 DIAGNOSIS — I890 Lymphedema, not elsewhere classified: Secondary | ICD-10-CM | POA: Diagnosis not present

## 2019-09-21 DIAGNOSIS — R2689 Other abnormalities of gait and mobility: Secondary | ICD-10-CM

## 2019-09-21 NOTE — Therapy (Signed)
[place], [place], [address] Phone: [phone]   Fax:  [phone]  Physical Therapy Treatment  Patient Details  Name: [name] [name] MRN: [id number] Date of Birth: 03-17-1946 Referring Provider (PT): Darci Current   Encounter Date: 09/21/2019   PT End of Session - 09/21/19 1301    Visit Number 50    Number of Visits 53    Date for PT Re-Evaluation 10/10/19    PT Start Time 1300    PT Stop Time 1330    PT Time Calculation (min) 30 min    Activity Tolerance Patient tolerated treatment well    Behavior During Therapy [facility] for tasks assessed/performed           Past Medical History:  Diagnosis Date  . Acute kidney injury ([place])   . Aortic stenosis, mild 11/17/2013  . Arthritis    "both knees, spine, back, fingers" (11/29/2013)  . CHF (congestive heart failure) ([place])   . Edema   . GERD (gastroesophageal reflux disease)   . Heart murmur   . History of diastolic dysfunction    Echo 11/2498 with diastolic dysfunction  . HTN (hypertension)   . Morbid obesity ([place])   . OA (osteoarthritis)   . PAC (premature atrial contraction)    per prior Holter  . Palpitations   . Pneumonia    "as a child"  . PVC's (premature ventricular contractions)    per prior Holter  . SVT (supraventricular tachycardia) ([place])   . Tachycardia    noted at 07/28/11 visit. Started on beta blocker. Possible atrial flutter vs long PR tachycardia/AVNRT  . Type II diabetes mellitus ([place])     Past Surgical History:  Procedure Laterality Date  . CESAREAN SECTION  1985  . CESAREAN SECTION  1985  . SUPRAVENTRICULAR TACHYCARDIA ABLATION  11/29/2013  . SUPRAVENTRICULAR TACHYCARDIA ABLATION N/A 11/29/2013   Procedure: SUPRAVENTRICULAR TACHYCARDIA ABLATION;  Surgeon: Evans Lance, MD;  Location[facility] CATH LAB;  Service: Cardiovascular;  Laterality: N/A;  . US ECHOCARDIOGRAPHY  07/25/2008   EF 55-60%  . VAGINAL DELIVERY       There were no vitals filed for this visit.   Subjective Assessment - 09/21/19 1301    Subjective Pt reports that she is doing the same. She hasn't noticed any weeping.    Pertinent History osteo arthritis, hx cellulitis, phlebitis and history of edema in BLE, Hx Acute kidney injury, CHF and DM II    Patient Stated Goals I want to get this swelling down and heal my wound.    Currently in Pain? Yes    Pain Score 8     Pain Location Knee    Pain Orientation Left;Right    Pain Descriptors / Indicators Aching    Pain Type Chronic pain    Pain Onset More than a month ago    Pain Frequency Intermittent    Aggravating Factors  movement    Pain Relieving Factors rest                             [facility] Adult PT Treatment/Exercise - 09/21/19 0001      Manual Therapy   Manual Therapy Edema management    Manual therapy comments ABD pads used over the lateral lower legs with one loosely wrapped isoband over the top  following skin care.     Edema Management folded edema wear over the bil lower legs over dressings  for the legs and then folded size K tubigrip over the bil thighs                  PT Education - 09/21/19 1336    Education Details Pt was instructed on how to dress her lower legs to catch weeping and wrap with isoband. Pt was able to perform return demonstration on the R lower leg.    Person(s) Educated Patient    Methods Explanation;Demonstration;Verbal cues    Comprehension Verbalized understanding;Returned demonstration            PT Short Term Goals - 09/05/19 1206      PT SHORT TERM GOAL #1   Title Pt will be independent with HEP    Baseline pt continues to work on her HEP occasionally at home.    Time 4    Period Weeks    Status On-going    Target Date 10/10/19             PT Long Term Goals - 09/05/19 1206      PT LONG TERM GOAL #1   Title Patient will have reduction of L lower leg limb girth by 3 cm    Baseline Pt continues  with fluctuations in edema.    Time 4    Period Weeks    Status On-going    Target Date 10/10/19      PT LONG TERM GOAL #2   Title Patient to be properly fitted with compression garment to wear on daily basis.    Baseline due to pt mobility/financial issues it has been difficult to find something that is appropriate for patient    Time 4    Period Weeks    Status On-going    Target Date 10/10/19      PT LONG TERM GOAL #3   Title Patient will be independent in self-care management principles including self-massage/bandaging and long term management plan for edema.    Baseline Pt continues to struggle with managing her lymphedema.    Time 4    Period Weeks    Status On-going    Target Date 10/10/19                 Plan - 09/21/19 1300    Clinical Impression Statement Pt presents with her edema wear and dressings still in place. This session had a focus on teaching pt how to dress and wrap her lower legs in order to prepare for discharge. She placed ABD pads over the lateral R lower leg, placed isoband over then donned liner. She then placed edemawear over the R lower leg folded. Discussed the importance of making sure there are not significant wrinkles as well as the importance of frequency including she should change every other day. For energy conservation discussed the importance of performing legs on alternate days or doing both legs every other day. She was able to dress, wrap and don edema wear in under 10 minutes on the R lower extremity. Pt was told to practice over the weekend, she is still very limited by funds and insurance coverage. She is going to try to purchase ABD pads and tape. LLE was taken care of by the physical therapist. Pt will benefit from continued POC at this time working toward discharge.    Personal Factors and Comorbidities Age;Comorbidity 3+    Comorbidities CHF, acute kidney injury, DM II, health literacy    Stability/Clinical Decision Making  Stable/Uncomplicated    Rehab Potential  Good    PT Frequency 2x / week    PT Duration Other (comment)    PT Treatment/Interventions Electrical Stimulation;Cryotherapy;Gait training;Stair training;Functional mobility training;Therapeutic activities;Therapeutic exercise;Balance training;Neuromuscular re-education;Patient/family education;Manual lymph drainage;Compression bandaging;Passive range of motion;Taping;Vasopneumatic Device    PT Next Visit Plan Continue with wrapping, help order garments if needed. Ask if she has spoke to Education officer, museum?    PT Home Exercise Plan pump and exercises keeping compression on the legs.    Consulted and Agree with Plan of Care Patient           Patient will benefit from skilled therapeutic intervention in order to improve the following deficits and impairments:  Difficulty walking, Increased edema  Visit Diagnosis: Lymphedema  Other abnormalities of gait and mobility     Problem List Patient Active Problem List   Diagnosis Date Noted  . Allergic rhinitis 07/28/2019  . Postnasal drip 11/16/2017  . Sensorineural hearing loss (SNHL), bilateral 11/16/2017  . Tinnitus of both ears 11/16/2017  . Obesity, Class II, BMI 35-39.9, isolated (see actual BMI) 11/08/2017  . CKD (chronic kidney disease), stage III 11/08/2017  . Anemia 11/07/2017  . Thrombocytosis ([place]) 11/07/2017  . Lymphedema 11/07/2017  . Routine general medical examination at a health care facility 05/10/2014  . Paroxysmal SVT (supraventricular tachycardia) ([place]) 11/28/2013  . Physical deconditioning 11/23/2013  . Generalized weakness 11/17/2013  . Nausea 11/17/2013  . Multilevel spine pain 11/17/2013  . Aortic stenosis, mild 11/17/2013  . DM type 2 (diabetes mellitus, type 2) ([place]) 02/02/2013  . Arthritis 10/19/2012  . Mixed incontinence 10/19/2012  . GERD (gastroesophageal reflux disease) 04/20/2011  . HTN (hypertension) 01/20/2011  . Morbid obesity [facility])     Ander Purpura,  PT 09/21/2019, 1:41 PM  [place], [place], [address] Phone: [phone]   Fax:  [phone]  Name: Manjit Bufano MRN: [id number] Date of Birth: 1946-03-21

## 2019-09-26 ENCOUNTER — Other Ambulatory Visit: Payer: Self-pay

## 2019-09-26 ENCOUNTER — Ambulatory Visit: Payer: PPO

## 2019-09-26 DIAGNOSIS — I890 Lymphedema, not elsewhere classified: Secondary | ICD-10-CM

## 2019-09-26 DIAGNOSIS — R2689 Other abnormalities of gait and mobility: Secondary | ICD-10-CM

## 2019-09-26 NOTE — Therapy (Signed)
[place], [place], [address] Phone: [phone]   Fax:  [phone]  Physical Therapy Treatment  Patient Details  Name: [name] [name] MRN: [id number] Date of Birth: [birth year] Referring Provider (PT): Darci Current   Encounter Date: 09/26/2019   PT End of Session - 09/26/19 1333    Visit Number 15    Date for PT Re-Evaluation 10/10/19    PT Start Time 4287    PT Stop Time 1335    PT Time Calculation (min) 30 min    Activity Tolerance Patient tolerated treatment well    Behavior During Therapy [facility] for tasks assessed/performed           Past Medical History:  Diagnosis Date  . Acute kidney injury ([place])   . Aortic stenosis, mild 11/17/2013  . Arthritis    "both knees, spine, back, fingers" (11/29/2013)  . CHF (congestive heart failure) ([place])   . Edema   . GERD (gastroesophageal reflux disease)   . Heart murmur   . History of diastolic dysfunction    Echo 09/8113 with diastolic dysfunction  . HTN (hypertension)   . Morbid obesity ([place])   . OA (osteoarthritis)   . PAC (premature atrial contraction)    per prior Holter  . Palpitations   . Pneumonia    "as a child"  . PVC's (premature ventricular contractions)    per prior Holter  . SVT (supraventricular tachycardia) ([place])   . Tachycardia    noted at 07/28/11 visit. Started on beta blocker. Possible atrial flutter vs long PR tachycardia/AVNRT  . Type II diabetes mellitus ([place])     Past Surgical History:  Procedure Laterality Date  . CESAREAN SECTION  1985  . CESAREAN SECTION  1985  . SUPRAVENTRICULAR TACHYCARDIA ABLATION  11/29/2013  . SUPRAVENTRICULAR TACHYCARDIA ABLATION N/A 11/29/2013   Procedure: SUPRAVENTRICULAR TACHYCARDIA ABLATION;  Surgeon: Evans Lance, MD;  Location: [facility] CATH LAB;  Service: Cardiovascular;  Laterality: N/A;  . US ECHOCARDIOGRAPHY  07/25/2008   EF 55-60%  . VAGINAL DELIVERY      There were no vitals filed  for this visit.   Subjective Assessment - 09/26/19 1333    Subjective Pt reports that she still has been unable to purchase any abdominal pads so she used sanitary napkins to cover her weeping legs over the weekend.    Pertinent History osteo arthritis, hx cellulitis, phlebitis and history of edema in BLE, Hx Acute kidney injury, CHF and DM II    Patient Stated Goals I want to get this swelling down and heal my wound.    Currently in Pain? Yes    Pain Score 8     Pain Location Knee    Pain Orientation Right;Left    Pain Descriptors / Indicators Aching    Pain Onset More than a month ago    Pain Frequency Intermittent    Aggravating Factors  movement    Pain Relieving Factors rest                             [facility] Adult PT Treatment/Exercise - 09/26/19 0001      Manual Therapy   Manual Therapy Edema management    Manual therapy comments ABD pads used over the lateral lower legs with one loosely wrapped isoband over the top  following skin care.     Edema Management folded edema wear for sensitive skin over the bil lower legs  over dressings for the legs and then folded size K tubigrip over the bil thighs                  PT Education - 09/26/19 1342    Education Details Discussed discharge with patient including 2 more visits 1 to check up on edema wear for sensitive skin. Second to determine autonomy of care and that patient has and is able to purchase what she needs to prevent hospitalization.    Person(s) Educated Patient    Methods Explanation    Comprehension Verbalized understanding            PT Short Term Goals - 09/05/19 1206      PT SHORT TERM GOAL #1   Title Pt will be independent with HEP    Baseline pt continues to work on her HEP occasionally at home.    Time 4    Period Weeks    Status On-going    Target Date 10/10/19             PT Long Term Goals - 09/05/19 1206      PT LONG TERM GOAL #1   Title Patient will have  reduction of L lower leg limb girth by 3 cm    Baseline Pt continues with fluctuations in edema.    Time 4    Period Weeks    Status On-going    Target Date 10/10/19      PT LONG TERM GOAL #2   Title Patient to be properly fitted with compression garment to wear on daily basis.    Baseline due to pt mobility/financial issues it has been difficult to find something that is appropriate for patient    Time 4    Period Weeks    Status On-going    Target Date 10/10/19      PT LONG TERM GOAL #3   Title Patient will be independent in self-care management principles including self-massage/bandaging and long term management plan for edema.    Baseline Pt continues to struggle with managing her lymphedema.    Time 4    Period Weeks    Status On-going    Target Date 10/10/19                 Plan - 09/26/19 1333    Clinical Impression Statement Pt presents to physical therapy with tubigrip on her bil LE. She has been using sanitary napkins to cover sensitive skin due to lack of support from insurance, social work and significant difficulty getting any type of financial assistance when she needs financial help to recieve the health care that she needs. Pt continues with very tender/sensitive skin at the bil lower legs that weeps and is easy to tear; her skin looks much better with her consistently changing the dressings using sanitary napkins which work for now due to pt financial difficulties. Pt lower legs were assessed and she was assissted with edemawear for sensitive skin that was donated to the clinic to see if pt is able to tolerate this better than regular edema wear and if it will contain fluid as needed to continue to help pt progress. Pt continues with improvement in the skin and fluctuations in fluid. Pt will benefit from coninued POC at this time working toward discharge. Pt physical therapy has been extended due to significant set backs related to poor health care coverage and pt  finances.    Personal Factors and Comorbidities Age;Comorbidity 3+  Comorbidities CHF, acute kidney injury, DM II, health literacy    Rehab Potential Good    PT Frequency 2x / week    PT Duration Other (comment)    PT Treatment/Interventions Electrical Stimulation;Cryotherapy;Gait training;Stair training;Functional mobility training;Therapeutic activities;Therapeutic exercise;Balance training;Neuromuscular re-education;Patient/family education;Manual lymph drainage;Compression bandaging;Passive range of motion;Taping;Vasopneumatic Device    PT Next Visit Plan Continue with wrapping, help order garments if needed. Ask if she has spoke to Education officer, museum?    PT Home Exercise Plan pump and exercises keeping compression on the legs.    Consulted and Agree with Plan of Care Patient           Patient will benefit from skilled therapeutic intervention in order to improve the following deficits and impairments:  Difficulty walking, Increased edema  Visit Diagnosis: Lymphedema  Other abnormalities of gait and mobility     Problem List Patient Active Problem List   Diagnosis Date Noted  . Allergic rhinitis 07/28/2019  . Postnasal drip 11/16/2017  . Sensorineural hearing loss (SNHL), bilateral 11/16/2017  . Tinnitus of both ears 11/16/2017  . Obesity, Class II, BMI 35-39.9, isolated (see actual BMI) 11/08/2017  . CKD (chronic kidney disease), stage III 11/08/2017  . Anemia 11/07/2017  . Thrombocytosis ([place]) 11/07/2017  . Lymphedema 11/07/2017  . Routine general medical examination at a health care facility 05/10/2014  . Paroxysmal SVT (supraventricular tachycardia) ([place]) 11/28/2013  . Physical deconditioning 11/23/2013  . Generalized weakness 11/17/2013  . Nausea 11/17/2013  . Multilevel spine pain 11/17/2013  . Aortic stenosis, mild 11/17/2013  . DM type 2 (diabetes mellitus, type 2) ([place]) 02/02/2013  . Arthritis 10/19/2012  . Mixed incontinence 10/19/2012  . GERD  (gastroesophageal reflux disease) 04/20/2011  . HTN (hypertension) 01/20/2011  . Morbid obesity [facility])     Ander Purpura, PT 09/26/2019, 1:47 PM  [place], [place], [address] Phone: [phone]   Fax:  [phone]  Name: Xariah Silvernail MRN: [id number] Date of Birth: Sep 30, 1945

## 2019-09-28 ENCOUNTER — Other Ambulatory Visit: Payer: Self-pay

## 2019-09-28 ENCOUNTER — Ambulatory Visit: Payer: PPO

## 2019-09-28 DIAGNOSIS — I890 Lymphedema, not elsewhere classified: Secondary | ICD-10-CM | POA: Diagnosis not present

## 2019-09-28 DIAGNOSIS — R2689 Other abnormalities of gait and mobility: Secondary | ICD-10-CM

## 2019-09-28 NOTE — Therapy (Signed)
[place], [place], [address] Phone: [phone]   Fax:  [phone]  Physical Therapy Treatment  Patient Details  Name: [name] [name] MRN: [id number] Date of Birth: [birth year] Referring Provider (PT): Darci Current   Encounter Date: 09/28/2019   PT End of Session - 09/28/19 1303    Visit Number 52    Number of Visits 53    Date for PT Re-Evaluation 10/10/19    PT Start Time 5009    PT Stop Time 1332    PT Time Calculation (min) 29 min    Activity Tolerance Patient tolerated treatment well    Behavior During Therapy [facility] for tasks assessed/performed           Past Medical History:  Diagnosis Date  . Acute kidney injury ([place])   . Aortic stenosis, mild 11/17/2013  . Arthritis    "both knees, spine, back, fingers" (11/29/2013)  . CHF (congestive heart failure) ([place])   . Edema   . GERD (gastroesophageal reflux disease)   . Heart murmur   . History of diastolic dysfunction    Echo 06/8180 with diastolic dysfunction  . HTN (hypertension)   . Morbid obesity ([place])   . OA (osteoarthritis)   . PAC (premature atrial contraction)    per prior Holter  . Palpitations   . Pneumonia    "as a child"  . PVC's (premature ventricular contractions)    per prior Holter  . SVT (supraventricular tachycardia) ([place])   . Tachycardia    noted at 07/28/11 visit. Started on beta blocker. Possible atrial flutter vs long PR tachycardia/AVNRT  . Type II diabetes mellitus ([place])     Past Surgical History:  Procedure Laterality Date  . CESAREAN SECTION  1985  . CESAREAN SECTION  1985  . SUPRAVENTRICULAR TACHYCARDIA ABLATION  11/29/2013  . SUPRAVENTRICULAR TACHYCARDIA ABLATION N/A 11/29/2013   Procedure: SUPRAVENTRICULAR TACHYCARDIA ABLATION;  Surgeon: Evans Lance, MD;  Location: [facility] CATH LAB;  Service: Cardiovascular;  Laterality: N/A;  . US ECHOCARDIOGRAPHY  07/25/2008   EF 55-60%  . VAGINAL DELIVERY       There were no vitals filed for this visit.   Subjective Assessment - 09/28/19 1302    Pertinent History osteo arthritis, hx cellulitis, phlebitis and history of edema in BLE, Hx Acute kidney injury, CHF and DM II    Patient Stated Goals I want to get this swelling down and heal my wound.    Currently in Pain? Yes    Pain Score 8     Pain Location Knee    Pain Orientation Right;Left    Pain Descriptors / Indicators Aching    Pain Type Chronic pain    Pain Onset More than a month ago    Pain Frequency Intermittent    Aggravating Factors  movement    Pain Relieving Factors rest                             [facility] Adult PT Treatment/Exercise - 09/28/19 0001      Manual Therapy   Manual Therapy Edema management    Manual therapy comments ABD pads over the lower part of the R lower leg and over the lower and midle part of the L lower leg. Covered in soft stockinette with edema wear over the top     Edema Management folded edema wear for sensitive skin over the bil lower legs over dressings for  the legs and then folded size K tubigrip over the bil thighs                  PT Education - 09/28/19 1332    Education Details Re-iterated instruction on pumping and performing compression change on bil lower legs every other day in order to prevent maceration and skin break down.    Person(s) Educated Patient    Methods Explanation    Comprehension Verbalized understanding            PT Short Term Goals - 09/05/19 1206      PT SHORT TERM GOAL #1   Title Pt will be independent with HEP    Baseline pt continues to work on her HEP occasionally at home.    Time 4    Period Weeks    Status On-going    Target Date 10/10/19             PT Long Term Goals - 09/05/19 1206      PT LONG TERM GOAL #1   Title Patient will have reduction of L lower leg limb girth by 3 cm    Baseline Pt continues with fluctuations in edema.    Time 4    Period Weeks     Status On-going    Target Date 10/10/19      PT LONG TERM GOAL #2   Title Patient to be properly fitted with compression garment to wear on daily basis.    Baseline due to pt mobility/financial issues it has been difficult to find something that is appropriate for patient    Time 4    Period Weeks    Status On-going    Target Date 10/10/19      PT LONG TERM GOAL #3   Title Patient will be independent in self-care management principles including self-massage/bandaging and long term management plan for edema.    Baseline Pt continues to struggle with managing her lymphedema.    Time 4    Period Weeks    Status On-going    Target Date 10/10/19                 Plan - 09/28/19 1302    Clinical Impression Statement Pt presents to physical therapy and has forgotten her clean garments. Pt lower legs were cleaned and dressed with ABD pads over the delicate skin on the bil lower legs. Soft stockinette was used to help protect skin followed by folded edema wear and size K tubigrip folded over bil thights; talking patient through education on how to perform application of ABD pads in multiple ways with demonstration using tape and stockinette to hold in place. Pt is going to work on edema management at home for one week independently and then return to physical therapy for one more visit to make sure that she has adequately been managing edema at home in order to prevent hospitalization related to immobility and infection.    Personal Factors and Comorbidities Age;Comorbidity 3+    Comorbidities CHF, acute kidney injury, DM II, health literacy    Rehab Potential Good    PT Frequency 2x / week    PT Duration Other (comment)    PT Treatment/Interventions Electrical Stimulation;Cryotherapy;Gait training;Stair training;Functional mobility training;Therapeutic activities;Therapeutic exercise;Balance training;Neuromuscular re-education;Patient/family education;Manual lymph drainage;Compression  bandaging;Passive range of motion;Taping;Vasopneumatic Device    PT Next Visit Plan Continue with wrapping, help order garments if needed. Ask if she has spoke to Education officer, museum?  PT Home Exercise Plan pump and exercises keeping compression on the legs.    Consulted and Agree with Plan of Care Patient           Patient will benefit from skilled therapeutic intervention in order to improve the following deficits and impairments:  Difficulty walking, Increased edema  Visit Diagnosis: Lymphedema  Other abnormalities of gait and mobility     Problem List Patient Active Problem List   Diagnosis Date Noted  . Allergic rhinitis 07/28/2019  . Postnasal drip 11/16/2017  . Sensorineural hearing loss (SNHL), bilateral 11/16/2017  . Tinnitus of both ears 11/16/2017  . Obesity, Class II, BMI 35-39.9, isolated (see actual BMI) 11/08/2017  . CKD (chronic kidney disease), stage III 11/08/2017  . Anemia 11/07/2017  . Thrombocytosis ([place]) 11/07/2017  . Lymphedema 11/07/2017  . Routine general medical examination at a health care facility 05/10/2014  . Paroxysmal SVT (supraventricular tachycardia) ([place]) 11/28/2013  . Physical deconditioning 11/23/2013  . Generalized weakness 11/17/2013  . Nausea 11/17/2013  . Multilevel spine pain 11/17/2013  . Aortic stenosis, mild 11/17/2013  . DM type 2 (diabetes mellitus, type 2) ([place]) 02/02/2013  . Arthritis 10/19/2012  . Mixed incontinence 10/19/2012  . GERD (gastroesophageal reflux disease) 04/20/2011  . HTN (hypertension) 01/20/2011  . Morbid obesity [facility])     Ander Purpura, PT 09/28/2019, 1:36 PM  [place], [place], [address] Phone: [phone]   Fax:  [phone]  Name: [name] [name] MRN: [id number] Date of Birth: October 02, 1945

## 2019-10-05 ENCOUNTER — Ambulatory Visit: Payer: PPO | Attending: Internal Medicine

## 2019-10-05 ENCOUNTER — Other Ambulatory Visit: Payer: Self-pay

## 2019-10-05 DIAGNOSIS — I890 Lymphedema, not elsewhere classified: Secondary | ICD-10-CM | POA: Diagnosis not present

## 2019-10-05 DIAGNOSIS — R2689 Other abnormalities of gait and mobility: Secondary | ICD-10-CM | POA: Insufficient documentation

## 2019-10-05 NOTE — Therapy (Signed)
[place], [place], [address] Phone: [phone]   Fax:  [phone]  Physical Therapy Discharge Note  Patient Details  Name: [name] [name] MRN: [id number] Date of Birth: 1945/06/02 Referring Provider (PT): Darci Current   Encounter Date: 10/05/2019   PT End of Session - 10/05/19 1300    Visit Number 34    Number of Visits 53    Date for PT Re-Evaluation 10/10/19    PT Start Time 1300    PT Stop Time 1340    PT Time Calculation (min) 40 min    Activity Tolerance Patient tolerated treatment well    Behavior During Therapy [facility] for tasks assessed/performed           Past Medical History:  Diagnosis Date  . Acute kidney injury ([place])   . Aortic stenosis, mild 11/17/2013  . Arthritis    "both knees, spine, back, fingers" (11/29/2013)  . CHF (congestive heart failure) ([place])   . Edema   . GERD (gastroesophageal reflux disease)   . Heart murmur   . History of diastolic dysfunction    Echo 10/9022 with diastolic dysfunction  . HTN (hypertension)   . Morbid obesity ([place])   . OA (osteoarthritis)   . PAC (premature atrial contraction)    per prior Holter  . Palpitations   . Pneumonia    "as a child"  . PVC's (premature ventricular contractions)    per prior Holter  . SVT (supraventricular tachycardia) ([place])   . Tachycardia    noted at 07/28/11 visit. Started on beta blocker. Possible atrial flutter vs long PR tachycardia/AVNRT  . Type II diabetes mellitus ([place])     Past Surgical History:  Procedure Laterality Date  . CESAREAN SECTION  1985  . CESAREAN SECTION  1985  . SUPRAVENTRICULAR TACHYCARDIA ABLATION  11/29/2013  . SUPRAVENTRICULAR TACHYCARDIA ABLATION N/A 11/29/2013   Procedure: SUPRAVENTRICULAR TACHYCARDIA ABLATION;  Surgeon: Evans Lance, MD;  Location: [facility] CATH LAB;  Service: Cardiovascular;  Laterality: N/A;  . US ECHOCARDIOGRAPHY  07/25/2008   EF 55-60%  . VAGINAL DELIVERY       There were no vitals filed for this visit.   Subjective Assessment - 10/05/19 1300    Pertinent History osteo arthritis, hx cellulitis, phlebitis and history of edema in BLE, Hx Acute kidney injury, CHF and DM II    Patient Stated Goals I want to get this swelling down and heal my wound.    Currently in Pain? Yes    Pain Score 8     Pain Location Knee    Pain Orientation Right;Left    Pain Descriptors / Indicators Aching    Pain Type Chronic pain    Pain Onset More than a month ago    Pain Frequency Intermittent    Aggravating Factors  movement    Pain Relieving Factors rest                 LYMPHEDEMA/ONCOLOGY QUESTIONNAIRE - 10/05/19 0001      Right Lower Extremity Lymphedema   10 cm Proximal to Suprapatella 66.5 cm    At Midpatella/Popliteal Crease 56 cm    30 cm Proximal to Floor at Lateral Plantar Foot 58 cm    20 cm Proximal to Floor at Lateral Plantar Foot 46 1    10 cm Proximal to Floor at Lateral Malleoli 31.7 cm    5 cm Proximal to 1st MTP Joint 24.2 cm    Across MTP Joint  24.8 cm    Around Proximal Great Toe 9.8 cm      Left Lower Extremity Lymphedema   20 cm Proximal to Suprapatella 71 cm    10 cm Proximal to Suprapatella 68.5 cm    At Midpatella/Popliteal Crease 55.5 cm    30 cm Proximal to Floor at Lateral Plantar Foot 52.8 cm    20 cm Proximal to Floor at Lateral Plantar Foot 42.5 cm    10 cm Proximal to Floor at Lateral Malleoli 30.4 cm    5 cm Proximal to 1st MTP Joint 24.5 cm    Around Proximal Great Toe 10.5 cm                      [facility] Adult PT Treatment/Exercise - 10/05/19 0001      Manual Therapy   Manual Therapy Edema management    Manual therapy comments ABD pads over the lower legs.     Edema Management circumferential measurements of bil legs. Folded edema wear for sensitive skin over the bil lower legs over dressings for the legs and then folded size K tubigrip over the bil thighs                  PT  Education - 10/05/19 1345    Education Details Re-iterated instruction on pumping and performing compression change on bil lower legs every other day as she has been doing to prevent skin maceration and skin break down. Emphasized the importance of the chronic progressive condition of lymphedema and that if she does not manage she will end up with sores. Discussed when to return to physical therapy.    Person(s) Educated Patient    Methods Explanation    Comprehension Verbalized understanding            PT Short Term Goals - 10/05/19 1347      PT SHORT TERM GOAL #1   Title Pt will be independent with HEP    Baseline pt continues to work on her HEP occasionally at home.    Status Partially Met             PT Long Term Goals - 10/05/19 1347      PT LONG TERM GOAL #1   Title Patient will have reduction of L lower leg limb girth by 3 cm    Baseline greater than 3 cm reduction at the knee and ankle on the L, and 2 cm reduction on the R in the thigh and calf.    Status Achieved      PT LONG TERM GOAL #2   Title Patient to be properly fitted with compression garment to wear on daily basis.    Baseline edema wear folded at the bil lower legs and folded size K tubigrip at bil thighs .    Status Achieved      PT LONG TERM GOAL #3   Title Patient will be independent in self-care management principles including self-massage/bandaging and long term management plan for edema.    Baseline Pt has managed her bil LE swelling and skin care management for one week successfully wtihout a significant increase in swelling or skin ulceration.    Status Achieved                 Plan - 10/05/19 1259    Clinical Impression Statement Pt presents to physical therapy and has been managing her edema wear. She has a decrease in circumferential measurements from the  last time she was measured. Pt has met all of her goals. She was able to manage her edema management and skin care for 1 week  independently without a significant increase in swelling or ulceration at the lateral lower legs. Pt will be discharged at this time. She is aware of reasons to return to physical therapy including infection, increased edema she is unable to manage or ulceration at the lower legs. Pt will be discharged today.    Personal Factors and Comorbidities Age;Comorbidity 3+    Comorbidities CHF, acute kidney injury, DM II, health literacy    Rehab Potential Good    PT Frequency 2x / week    PT Duration Other (comment)    PT Treatment/Interventions Electrical Stimulation;Cryotherapy;Gait training;Stair training;Functional mobility training;Therapeutic activities;Therapeutic exercise;Balance training;Neuromuscular re-education;Patient/family education;Manual lymph drainage;Compression bandaging;Passive range of motion;Taping;Vasopneumatic Device    PT Next Visit Plan Pt will be discharged today    PT Home Exercise Plan pump and exercises keeping compression on the legs.    Consulted and Agree with Plan of Care Patient           Patient will benefit from skilled therapeutic intervention in order to improve the following deficits and impairments:  Difficulty walking, Increased edema  Visit Diagnosis: Lymphedema  Other abnormalities of gait and mobility     Problem List Patient Active Problem List   Diagnosis Date Noted  . Allergic rhinitis 07/28/2019  . Postnasal drip 11/16/2017  . Sensorineural hearing loss (SNHL), bilateral 11/16/2017  . Tinnitus of both ears 11/16/2017  . Obesity, Class II, BMI 35-39.9, isolated (see actual BMI) 11/08/2017  . CKD (chronic kidney disease), stage III 11/08/2017  . Anemia 11/07/2017  . Thrombocytosis ([place]) 11/07/2017  . Lymphedema 11/07/2017  . Routine general medical examination at a health care facility 05/10/2014  . Paroxysmal SVT (supraventricular tachycardia) ([place]) 11/28/2013  . Physical deconditioning 11/23/2013  . Generalized weakness 11/17/2013  .  Nausea 11/17/2013  . Multilevel spine pain 11/17/2013  . Aortic stenosis, mild 11/17/2013  . DM type 2 (diabetes mellitus, type 2) ([place]) 02/02/2013  . Arthritis 10/19/2012  . Mixed incontinence 10/19/2012  . GERD (gastroesophageal reflux disease) 04/20/2011  . HTN (hypertension) 01/20/2011  . Morbid obesity ([place])     PHYSICAL THERAPY DISCHARGE SUMMARY  Plan: Patient agrees to discharge.  Patient goals were met. Patient is being discharged due to meeting the stated rehab goals.  ?????       Ander Purpura, PT 10/05/2019, 1:55 PM  [place], [place], [address] Phone: [phone]   Fax:  [phone]  Name: Sabrea Sankey MRN: [id number] Date of Birth: [birth year]

## 2019-11-10 ENCOUNTER — Other Ambulatory Visit: Payer: Self-pay

## 2019-11-10 ENCOUNTER — Ambulatory Visit: Payer: PPO | Admitting: Podiatry

## 2019-11-10 ENCOUNTER — Encounter: Payer: Self-pay | Admitting: Podiatry

## 2019-11-10 DIAGNOSIS — B351 Tinea unguium: Secondary | ICD-10-CM | POA: Diagnosis not present

## 2019-11-10 DIAGNOSIS — M79675 Pain in left toe(s): Secondary | ICD-10-CM

## 2019-11-10 DIAGNOSIS — E1151 Type 2 diabetes mellitus with diabetic peripheral angiopathy without gangrene: Secondary | ICD-10-CM

## 2019-11-10 DIAGNOSIS — M79674 Pain in right toe(s): Secondary | ICD-10-CM

## 2019-11-10 DIAGNOSIS — E1142 Type 2 diabetes mellitus with diabetic polyneuropathy: Secondary | ICD-10-CM

## 2019-11-10 NOTE — Progress Notes (Signed)
Subjective: [name] [name] presents today with diabetes, diabetic neuropathy and PAD with cc of painful, discolored, thick toenails which interfere with daily activities. Pain is aggravated when wearing enclosed shoe gear. Pain is getting progressively worse and relieved with periodic professional debridement.   [name] [name] continues to use pumps for her chronic LE lymphedema. She states she has been away from clinic so long due to COVID-19 pandemic.  PCP: Dr. [name]; last visit 07/28/2019.   Current Outpatient Medications:  .  acetaminophen (TYLENOL) 325 MG tablet, Take 2 tablets (650 mg total) by mouth every 6 (six) hours as needed for mild pain (or Fever >/= 101)., Disp: , Rfl:  .  aspirin EC 81 MG tablet, Take 81 mg by mouth daily with breakfast. Takes once or twice weekly, Disp: , Rfl:  .  celecoxib (CELEBREX) 100 MG capsule, Take 1 capsule (100 mg total) by mouth 2 (two) times daily., Disp: 180 capsule, Rfl: 1 .  feeding supplement, ENSURE COMPLETE, (ENSURE COMPLETE) LIQD, Take 237 mLs by mouth 2 (two) times daily between meals., Disp: 13272 mL, Rfl: 11 .  furosemide (LASIX) 40 MG tablet, TAKE 1 TABLET BY MOUTH EVERY DAY, Disp: 90 tablet, Rfl: 3 .  losartan (COZAAR) 25 MG tablet, TAKE 4 TABLETS (100 MG TOTAL) BY MOUTH DAILY., Disp: 360 tablet, Rfl: 3 .  metFORMIN (GLUCOPHAGE) 500 MG tablet, Take 1 tablet (500 mg total) by mouth daily with breakfast., Disp: 90 tablet, Rfl: 3 .  methocarbamol (ROBAXIN) 500 MG tablet, Take 1 tablet (500 mg total) by mouth every 6 (six) hours as needed for muscle spasms., Disp: 30 tablet, Rfl: 0 .  metoprolol tartrate (LOPRESSOR) 50 MG tablet, TAKE 1 TABLET BY MOUTH TWICE A DAY, Disp: 180 tablet, Rfl: 3 .  montelukast (SINGULAIR) 10 MG tablet, Take 1 tablet (10 mg total) by mouth at bedtime., Disp: 90 tablet, Rfl: 3 .  Neomycin-Bacitracin-Polymyxin ([employer] ANTIBIOTIC) [phone] OINT, APPLY AFTER CLEANING BEFORE WRAPPING LGS (Patient taking  differently: Apply 1 application topically daily. Apply after cleaning before wrapping lgs), Disp: 453.9 g, Rfl: 0 .  nutrition supplement, JUVEN, (JUVEN) PACK, Take 1 packet by mouth 2 (two) times daily between meals., Disp: 180 packet, Rfl: 3 .  ondansetron (ZOFRAN) 4 MG tablet, Take 1 tablet (4 mg total) by mouth every 8 (eight) hours as needed for nausea or vomiting., Disp: 30 tablet, Rfl: 1 .  pantoprazole (PROTONIX) 40 MG tablet, TAKE 1 TABLET BY MOUTH EVERY DAY, Disp: 90 tablet, Rfl: 3 .  polyethylene glycol (MIRALAX / GLYCOLAX) packet, Take 17 g by mouth daily., Disp: 14 each, Rfl: 0 .  RESTASIS 0.05 % ophthalmic emulsion, 1 drop 2 (two) times daily., Disp: , Rfl:   Allergies  Allergen Reactions  . Oxycodone Hcl Nausea And Vomiting  . Penicillins Itching    Has patient had a PCN reaction causing immediate rash, facial/tongue/throat swelling, SOB or lightheadedness with hypotension: No Has patient had a PCN reaction causing severe rash involving mucus membranes or skin necrosis: No Has patient had a PCN reaction that required hospitalization: No Has patient had a PCN reaction occurring within the last 10 years: No If all of the above answers are "NO", then may proceed with Cephalosporin use.    Objective: Vascular Examination: Capillary refill time <5 seconds x 10 digits Dorsalis pedis pulses absent b/l Posterior tibial pulses absent b/l No digital hair x 10 digits Skin temperature unable to assess proximal to distal due to leg wraps, but exposed digits are cool,  but not ischemic.  Dermatological Examination:  BLE leg wraps to level of forefoot  Exposed skin shows normal turgor, texture and tone b/l.  Toenails 1-5 b/l discolored, thick, dystrophic with subungual debris and pain with palpation to nailbeds due to thickness of nails.  Exposed feet/digits exhibit no wounds.  Musculoskeletal: Muscle strength 5/5 to all LE muscle groups  Neurological: Sensation diminished  with 10 gram monofilament.  Vibratory sensation diminished  Assessment: 1. Painful onychomycosis toenails 1-5 b/l 2. NIDDM with Peripheral arterial disease 3. Diabetic neuropathy  Plan: 1. Discuss diabetic foot care principles. Literature dispensed. 2. Recommended spray moisturizers due to her not being able to reach her feet. Patient given printed list. 3. Toenails 1-5 b/l were debrided in length and girth without iatrogenic bleeding. 4. Patient to continue soft, supportive shoe gear 5. Patient to report any pedal injuries to medical professional  6. Follow up 3 months. Patient/POA to call should there be a concern in the interim.

## 2019-11-10 NOTE — Patient Instructions (Signed)
Moisturize feet once daily; do not apply between toes: If you have problems reaching your feet:  A.  Aquaphor Advanced Therapy Ointment Body Spray B.  Vaseline Intensive Care Spray Lotion Advanced Repair    

## 2019-11-15 ENCOUNTER — Telehealth: Payer: Self-pay | Admitting: Podiatry

## 2019-11-15 NOTE — Telephone Encounter (Signed)
Pt called and wanted to know prescriptions you prescribed her last week, She lost the print outs and wanted to know if she could get it over the counter.

## 2019-11-16 NOTE — Telephone Encounter (Signed)
Just trying to empty my inbox. I think we already discussed, but print and mail her out a new AVS. Thanks.

## 2020-01-11 ENCOUNTER — Other Ambulatory Visit: Payer: Self-pay | Admitting: Internal Medicine

## 2020-01-11 ENCOUNTER — Telehealth: Payer: Self-pay | Admitting: Internal Medicine

## 2020-01-11 NOTE — Telephone Encounter (Signed)
Yes, need OV or UC

## 2020-01-11 NOTE — Telephone Encounter (Signed)
Doxycycline 100 mg tabs  [id number] [address], [place] Phone:  [phone]  Fax:  [phone]      Last appt: 07/28/19 Next appt: 02/15/20

## 2020-01-11 NOTE — Telephone Encounter (Signed)
Called pt, LVM.   Clarification on why she needs this antibiotic since it is not listed as a current medication. Schedule an appt if she feels she needs a refill.

## 2020-01-11 NOTE — Telephone Encounter (Signed)
Pt stated the medication is for infection in her legs caused by lymphedema. I informed her an OV may be required since PCP is out of office. She expressed understanding. Please advise.

## 2020-01-12 NOTE — Telephone Encounter (Signed)
  Patient was offered same day appointment at [facility], patient declined due to transportation

## 2020-01-15 NOTE — Progress Notes (Signed)
Subjective:    Patient ID: [name] [name], female    DOB: 1945/11/26, 74 y.o.   MRN: [id number]  HPI The patient is here for an acute visit.  B/l leg swelling-she is chronic lymphedema and she states recently her leg swelling has actually started to go down.  She tries to keep them wrapped.  She does have some drainage from the legs.  She is not currently following with the wound center, but states she was following with them for very long time because of wounds on her legs.   ?  Dehydration: She can not eat anything  - has not eaten anything since last week.  She is sipping fluids.  She is only making a little bit of urine.  She thinks she is dehydrated -these of the same symptoms she had when she was very dehydrated when she had a UTI about 1 year ago.  She has had this occur a couple of other times in the past.     Medications and allergies reviewed with patient and updated if appropriate.  Patient Active Problem List   Diagnosis Date Noted  . Allergic rhinitis 07/28/2019  . Postnasal drip 11/16/2017  . Sensorineural hearing loss (SNHL), bilateral 11/16/2017  . Tinnitus of both ears 11/16/2017  . Obesity, Class II, BMI 35-39.9, isolated (see actual BMI) 11/08/2017  . CKD (chronic kidney disease), stage III ([place]) 11/08/2017  . Anemia 11/07/2017  . Thrombocytosis 11/07/2017  . Lymphedema 11/07/2017  . Routine general medical examination at a health care facility 05/10/2014  . Paroxysmal SVT (supraventricular tachycardia) ([place]) 11/28/2013  . Physical deconditioning 11/23/2013  . Generalized weakness 11/17/2013  . Nausea 11/17/2013  . Multilevel spine pain 11/17/2013  . Aortic stenosis, mild 11/17/2013  . DM type 2 (diabetes mellitus, type 2) ([place]) 02/02/2013  . Arthritis 10/19/2012  . Mixed incontinence 10/19/2012  . GERD (gastroesophageal reflux disease) 04/20/2011  . HTN (hypertension) 01/20/2011  . Morbid obesity ([place])     Current Outpatient Medications on File Prior  to Visit  Medication Sig Dispense Refill  . acetaminophen (TYLENOL) 325 MG tablet Take 2 tablets (650 mg total) by mouth every 6 (six) hours as needed for mild pain (or Fever >/= 101).    Marland Kitchen aspirin EC 81 MG tablet Take 81 mg by mouth daily with breakfast. Takes once or twice weekly    . celecoxib (CELEBREX) 100 MG capsule TAKE 1 CAPSULE BY MOUTH TWICE A DAY 180 capsule 0  . feeding supplement, ENSURE COMPLETE, (ENSURE COMPLETE) LIQD Take 237 mLs by mouth 2 (two) times daily between meals. 13272 mL 11  . furosemide (LASIX) 40 MG tablet TAKE 1 TABLET BY MOUTH EVERY DAY 90 tablet 3  . losartan (COZAAR) 25 MG tablet TAKE 4 TABLETS (100 MG TOTAL) BY MOUTH DAILY. 360 tablet 3  . metFORMIN (GLUCOPHAGE) 500 MG tablet Take 1 tablet (500 mg total) by mouth daily with breakfast. 90 tablet 3  . methocarbamol (ROBAXIN) 500 MG tablet Take 1 tablet (500 mg total) by mouth every 6 (six) hours as needed for muscle spasms. 30 tablet 0  . metoprolol tartrate (LOPRESSOR) 50 MG tablet TAKE 1 TABLET BY MOUTH TWICE A DAY 180 tablet 3  . montelukast (SINGULAIR) 10 MG tablet Take 1 tablet (10 mg total) by mouth at bedtime. 90 tablet 3  . Neomycin-Bacitracin-Polymyxin ([facility] ANTIBIOTIC) [phone] OINT APPLY AFTER CLEANING BEFORE WRAPPING LGS (Patient taking differently: Apply 1 application topically daily. Apply after cleaning before wrapping lgs) 453.9 g 0  .  nutrition supplement, JUVEN, (JUVEN) PACK Take 1 packet by mouth 2 (two) times daily between meals. 180 packet 3  . ondansetron (ZOFRAN) 4 MG tablet Take 1 tablet (4 mg total) by mouth every 8 (eight) hours as needed for nausea or vomiting. 30 tablet 1  . pantoprazole (PROTONIX) 40 MG tablet TAKE 1 TABLET BY MOUTH EVERY DAY 90 tablet 3  . polyethylene glycol (MIRALAX / GLYCOLAX) packet Take 17 g by mouth daily. 14 each 0  . RESTASIS 0.05 % ophthalmic emulsion 1 drop 2 (two) times daily.     No current facility-administered medications on file prior to visit.     Past Medical History:  Diagnosis Date  . Acute kidney injury ([place])   . Aortic stenosis, mild 11/17/2013  . Arthritis    "both knees, spine, back, fingers" (11/29/2013)  . CHF (congestive heart failure) ([place])   . Edema   . GERD (gastroesophageal reflux disease)   . Heart murmur   . History of diastolic dysfunction    Echo 12/3714 with diastolic dysfunction  . HTN (hypertension)   . Morbid obesity ([place])   . OA (osteoarthritis)   . PAC (premature atrial contraction)    per prior Holter  . Palpitations   . Pneumonia    "as a child"  . PVC's (premature ventricular contractions)    per prior Holter  . SVT (supraventricular tachycardia) ([place])   . Tachycardia    noted at 07/28/11 visit. Started on beta blocker. Possible atrial flutter vs long PR tachycardia/AVNRT  . Type II diabetes mellitus ([place])     Past Surgical History:  Procedure Laterality Date  . CESAREAN SECTION  1985  . CESAREAN SECTION  1985  . SUPRAVENTRICULAR TACHYCARDIA ABLATION  11/29/2013  . SUPRAVENTRICULAR TACHYCARDIA ABLATION N/A 11/29/2013   Procedure: SUPRAVENTRICULAR TACHYCARDIA ABLATION;  Surgeon: Evans Lance, MD;  Location: [facility] CATH LAB;  Service: Cardiovascular;  Laterality: N/A;  . US ECHOCARDIOGRAPHY  07/25/2008   EF 55-60%  . VAGINAL DELIVERY      Social History   Socioeconomic History  . Marital status: Single    Spouse name: Not on file  . Number of children: 2  . Years of education: Not on file  . Highest education level: Not on file  Occupational History  . 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 Smoking status: Former Smoker    Packs/day: 2.00    Years: 15.00    Pack years: 30.00    Types: Cigarettes    Quit date: 11/23/1985    Years since quitting: 34.1  . Smokeless tobacco: Never Used  Vaping Use  . Vaping Use: Never used  Substance and Sexual Activity  . Alcohol use: No  . Drug use: No  . Sexual activity: Never  Other Topics  Concern  . Not on file  Social History Narrative  . Not on file   Social Determinants of Health   Financial Resource Strain:   . Difficulty of Paying Living Expenses: Not on file  Food Insecurity:   . Worried About Charity fundraiser in the Last Year: Not on file  . Ran Out of Food in the Last Year: Not on file  Transportation Needs:   . Lack of Transportation (Medical): Not on file  . Lack of Transportation (Non-Medical): Not on file  Physical Activity:   . Days of Exercise per Week: Not on file  . Minutes of Exercise per Session:  Not on file  Stress:   . Feeling of Stress : Not on file  Social Connections:   . Frequency of Communication with Friends and Family: Not on file  . Frequency of Social Gatherings with Friends and Family: Not on file  . Attends Religious Services: Not on file  . Active Member of Clubs or Organizations: Not on file  . Attends Archivist Meetings: Not on file  . Marital Status: Not on file    Family History  Problem Relation Age of Onset  . Alzheimer's disease Mother   . Lung cancer Mother   . COPD Father   . Diabetes Maternal Uncle   . Stroke Neg Hx   . Colon cancer Neg Hx   . Stomach cancer Neg Hx   . Esophageal cancer Neg Hx     Review of Systems  Constitutional: Positive for fatigue. Negative for chills and fever.  Respiratory: Negative for cough, shortness of breath and wheezing.   Cardiovascular: Positive for leg swelling (chronic - leaking). Negative for chest pain and palpitations.  Gastrointestinal: Positive for nausea and vomiting (a little). Negative for abdominal pain, blood in stool, constipation and diarrhea.  Genitourinary: Positive for decreased urine volume. Negative for dysuria, frequency and hematuria.       Dark urine, no odor  Neurological: Positive for light-headedness. Negative for headaches.       Objective:   Vitals:   01/16/20 1510  BP: 136/78  Pulse: 95  Temp: 98.4 F (36.9 C)  SpO2: 98%   BP  Readings from Last 3 Encounters:  01/16/20 136/78  07/28/19 138/74  07/11/19 140/80   Wt Readings from Last 3 Encounters:  01/16/20 267 lb (121.1 kg)  07/28/19 (!) 310 lb 3.2 oz (140.7 kg)  07/11/19 (!) 300 lb 9.6 oz (136.4 kg)   Body mass index is 37.24 kg/m.   Physical Exam Constitutional:      General: She is not in acute distress.    Appearance: She is obese. She is ill-appearing (Moderately ill-appearing). She is not toxic-appearing or diaphoretic.  HENT:     Head: Normocephalic and atraumatic.  Cardiovascular:     Rate and Rhythm: Normal rate and regular rhythm.  Pulmonary:     Effort: Pulmonary effort is normal. No respiratory distress.     Breath sounds: No wheezing or rales.  Abdominal:     Palpations: Abdomen is soft.     Tenderness: There is no abdominal tenderness. There is no guarding or rebound.  Musculoskeletal:     Right lower leg: Edema (2+ pitting edema with peeling skin and areas where a couple of layers of skin are gone with mild oozing blood from these areas.  No pus) present.     Left lower leg: Edema (2+ pitting edema, peeling skin, ping-pong ball sized ulcer anterior lower leg with mild oozing of blood, several more superficial ulcers with oozing blood.  No blisters.  No pus) present.  Skin:    General: Skin is warm and dry.  Neurological:     Mental Status: She is alert.            Assessment & Plan:     See Problem List for Assessment and Plan of chronic medical problems.    This visit occurred during the SARS-CoV-2 public health emergency.  Safety protocols were in place, including screening questions prior to the visit, additional usage of staff PPE, and extensive cleaning of exam room while observing appropriate contact time as indicated  for disinfecting solutions.

## 2020-01-16 ENCOUNTER — Other Ambulatory Visit: Payer: Self-pay

## 2020-01-16 ENCOUNTER — Encounter: Payer: Self-pay | Admitting: Internal Medicine

## 2020-01-16 ENCOUNTER — Ambulatory Visit (INDEPENDENT_AMBULATORY_CARE_PROVIDER_SITE_OTHER): Payer: PPO | Admitting: Internal Medicine

## 2020-01-16 VITALS — BP 136/78 | HR 95 | Temp 98.4°F | Ht 71.0 in | Wt 267.0 lb

## 2020-01-16 DIAGNOSIS — R110 Nausea: Secondary | ICD-10-CM | POA: Diagnosis not present

## 2020-01-16 DIAGNOSIS — L97921 Non-pressure chronic ulcer of unspecified part of left lower leg limited to breakdown of skin: Secondary | ICD-10-CM | POA: Diagnosis not present

## 2020-01-16 DIAGNOSIS — L97911 Non-pressure chronic ulcer of unspecified part of right lower leg limited to breakdown of skin: Secondary | ICD-10-CM | POA: Diagnosis not present

## 2020-01-16 DIAGNOSIS — R34 Anuria and oliguria: Secondary | ICD-10-CM | POA: Insufficient documentation

## 2020-01-16 DIAGNOSIS — E860 Dehydration: Secondary | ICD-10-CM | POA: Diagnosis not present

## 2020-01-16 LAB — CBC WITH DIFFERENTIAL/PLATELET
Basophils Absolute: 0.1 10*3/uL (ref 0.0–0.1)
Basophils Relative: 0.8 % (ref 0.0–3.0)
Eosinophils Absolute: 0.1 10*3/uL (ref 0.0–0.7)
Eosinophils Relative: 0.6 % (ref 0.0–5.0)
HCT: 36.8 % (ref 36.0–46.0)
Hemoglobin: 12 g/dL (ref 12.0–15.0)
Lymphocytes Relative: 11.7 % — ABNORMAL LOW (ref 12.0–46.0)
Lymphs Abs: 1.1 10*3/uL (ref 0.7–4.0)
MCHC: 32.6 g/dL (ref 30.0–36.0)
MCV: 77.1 fl — ABNORMAL LOW (ref 78.0–100.0)
Monocytes Absolute: 0.7 10*3/uL (ref 0.1–1.0)
Monocytes Relative: 7.8 % (ref 3.0–12.0)
Neutro Abs: 7.3 10*3/uL (ref 1.4–7.7)
Neutrophils Relative %: 79.1 % — ABNORMAL HIGH (ref 43.0–77.0)
Platelets: 488 10*3/uL — ABNORMAL HIGH (ref 150.0–400.0)
RBC: 4.78 Mil/uL (ref 3.87–5.11)
RDW: 17.3 % — ABNORMAL HIGH (ref 11.5–15.5)
WBC: 9.2 10*3/uL (ref 4.0–10.5)

## 2020-01-16 LAB — COMPREHENSIVE METABOLIC PANEL
ALT: 10 U/L (ref 0–35)
AST: 18 U/L (ref 0–37)
Albumin: 3.8 g/dL (ref 3.5–5.2)
Alkaline Phosphatase: 65 U/L (ref 39–117)
BUN: 69 mg/dL — ABNORMAL HIGH (ref 6–23)
CO2: 19 mEq/L (ref 19–32)
Calcium: 9.4 mg/dL (ref 8.4–10.5)
Chloride: 101 mEq/L (ref 96–112)
Creatinine, Ser: 1.64 mg/dL — ABNORMAL HIGH (ref 0.40–1.20)
GFR: 30.65 mL/min — ABNORMAL LOW (ref >60.00)
Glucose, Bld: 87 mg/dL (ref 70–99)
Potassium: 4.9 mEq/L (ref 3.5–5.1)
Sodium: 133 mEq/L — ABNORMAL LOW (ref 135–145)
Total Bilirubin: 0.4 mg/dL (ref 0.2–1.2)
Total Protein: 8.6 g/dL — ABNORMAL HIGH (ref 6.0–8.3)

## 2020-01-16 MED ORDER — MUPIROCIN 2 % EX OINT: 1.0000 "application " | TOPICAL_OINTMENT | Freq: Every day | CUTANEOUS | 3 refills | Status: DC

## 2020-01-16 MED ORDER — CEPHALEXIN 500 MG PO CAPS: 500.0000 mg | ORAL_CAPSULE | Freq: Two times a day (BID) | ORAL | 0 refills | Status: DC

## 2020-01-16 NOTE — Assessment & Plan Note (Signed)
Acute clinically she is dehydrated Concern for AKI on CKD Not currently taking furosemide We will check CBC, CMP Likely related to UTI-check UA, urine culture

## 2020-01-16 NOTE — Assessment & Plan Note (Signed)
Acute States decreased urination and has signs and symptoms of dehydration Likely UTI, since she has had this in the past She will try giving a urine sample today, but she has not been producing much urine and does not think she will be able to CBC, CMP Will empirically treat for UTI-Keflex twice daily x1 week

## 2020-01-16 NOTE — Assessment & Plan Note (Signed)
Acute Experiencing nausea and decreased appetite Take Zofran as needed for nausea, which she has at home

## 2020-01-16 NOTE — Assessment & Plan Note (Addendum)
Acute Bilateral lymphedema with bilateral leg ulcers that do not appear to be infected-breakdown limited to skin Currently this is a recurring problem due to her lymphedema She feels the swelling has improved slightly Not currently taking furosemide and cannot take at this time due to dehydration Continue to elevate legs Has compression devices at home and needs to use them daily Legs were rewrapped here today She will need to wrap it daily-we will prescribe Bactroban so she can prevent sticking of her pads to her leg ulcers Deferred home health RN Referred back to the wound center to make sure that these heal well

## 2020-01-16 NOTE — Patient Instructions (Addendum)
  Blood work and urine tests were ordered.     Medications reviewed and updated.  Changes include :   Keflex twice daily for one week and antibacterial ointment.  Your prescription(s) have been submitted to your pharmacy. Please take as directed and contact our office if you believe you are having problem(s) with the medication(s).  A referral was ordered for the wound center.       Someone from their office will call you to schedule an appointment.

## 2020-01-17 LAB — URINE CULTURE

## 2020-01-17 LAB — URINALYSIS, ROUTINE W REFLEX MICROSCOPIC
Leukocytes,Ua: NEGATIVE
Nitrite: POSITIVE — AB
Specific Gravity, Urine: >=1.03 — AB (ref 1.000–1.030)
Urine Glucose: NEGATIVE
Urobilinogen, UA: 0.2 (ref 0.0–1.0)
pH: 5 (ref 5.0–8.0)

## 2020-01-18 ENCOUNTER — Telehealth: Payer: Self-pay | Admitting: Internal Medicine

## 2020-01-18 NOTE — Telephone Encounter (Signed)
Ok to take imodium only when needed.  If diarrhea persists or worsens she needs to let us know so we can do further tests.

## 2020-01-18 NOTE — Telephone Encounter (Signed)
Please call her and see how she is feeling.  Her initial urinalysis showed probable infection, but the urine culture showed possible contamination and no definite infection.  Just wanted to make sure she is feeling better and headed in the right direction.

## 2020-01-19 NOTE — Telephone Encounter (Signed)
   Patient made aware to to take Imodium only when needed, she also reports back pain. She states she is taking tylenol. What other medication OTC should she consider taking?  Please call patient

## 2020-01-19 NOTE — Telephone Encounter (Signed)
Spoke with patient today.  She is going to hydrate more and start with bland foods to see if this will help slow the diarrhea down. She will call next week and let us know how she is doing.

## 2020-01-26 NOTE — Telephone Encounter (Signed)
She really needs to be seen again to reevaluate all her symptoms.  If she is not keeping anything down she may need to go to urgent care this weekend

## 2020-01-26 NOTE — Telephone Encounter (Signed)
Patient called and said she still has no appetite and that she said that she finished her anti biotics. She was wondering if something else could be sent in. She said that she is having trouble keeping things down and she is still nauseated.    Please call the patient back: [phone]

## 2020-01-27 ENCOUNTER — Other Ambulatory Visit: Payer: Self-pay | Admitting: Internal Medicine

## 2020-01-30 ENCOUNTER — Other Ambulatory Visit: Payer: Self-pay

## 2020-01-30 ENCOUNTER — Encounter (HOSPITAL_COMMUNITY): Payer: Self-pay

## 2020-01-30 ENCOUNTER — Ambulatory Visit (HOSPITAL_COMMUNITY)
Admission: EM | Admit: 2020-01-30 | Discharge: 2020-01-30 | Disposition: A | Discharge status: Home / Self Care | Payer: PPO | Attending: Family Medicine | Admitting: Family Medicine

## 2020-01-30 DIAGNOSIS — N179 Acute kidney failure, unspecified: Secondary | ICD-10-CM

## 2020-01-30 DIAGNOSIS — R110 Nausea: Secondary | ICD-10-CM | POA: Insufficient documentation

## 2020-01-30 DIAGNOSIS — E860 Dehydration: Secondary | ICD-10-CM | POA: Diagnosis not present

## 2020-01-30 DIAGNOSIS — R531 Weakness: Secondary | ICD-10-CM | POA: Insufficient documentation

## 2020-01-30 LAB — CBC WITH DIFFERENTIAL/PLATELET
Abs Immature Granulocytes: 0.04 10*3/uL (ref 0.00–0.07)
Basophils Absolute: 0.1 10*3/uL (ref 0.0–0.1)
Basophils Relative: 1 %
Eosinophils Absolute: 0.1 10*3/uL (ref 0.0–0.5)
Eosinophils Relative: 1 %
HCT: 38.7 % (ref 36.0–46.0)
Hemoglobin: 11.9 g/dL — ABNORMAL LOW (ref 12.0–15.0)
Immature Granulocytes: 1 %
Lymphocytes Relative: 12 %
Lymphs Abs: 1 10*3/uL (ref 0.7–4.0)
MCH: 24.3 pg — ABNORMAL LOW (ref 26.0–34.0)
MCHC: 30.7 g/dL (ref 30.0–36.0)
MCV: 79.1 fL — ABNORMAL LOW (ref 80.0–100.0)
Monocytes Absolute: 0.6 10*3/uL (ref 0.1–1.0)
Monocytes Relative: 7 %
Neutro Abs: 6.5 10*3/uL (ref 1.7–7.7)
Neutrophils Relative %: 78 %
Platelets: 335 10*3/uL (ref 150–400)
RBC: 4.89 MIL/uL (ref 3.87–5.11)
RDW: 17.3 % — ABNORMAL HIGH (ref 11.5–15.5)
WBC: 8.3 10*3/uL (ref 4.0–10.5)
nRBC: 0 % (ref 0.0–0.2)

## 2020-01-30 LAB — COMPREHENSIVE METABOLIC PANEL
ALT: 15 U/L (ref 0–44)
AST: 27 U/L (ref 15–41)
Albumin: 3.4 g/dL — ABNORMAL LOW (ref 3.5–5.0)
Alkaline Phosphatase: 73 U/L (ref 38–126)
Anion gap: 11 (ref 5–15)
BUN: 43 mg/dL — ABNORMAL HIGH (ref 8–23)
CO2: 22 mmol/L (ref 22–32)
Calcium: 9.7 mg/dL (ref 8.9–10.3)
Chloride: 105 mmol/L (ref 98–111)
Creatinine, Ser: 2.42 mg/dL — ABNORMAL HIGH (ref 0.44–1.00)
GFR, Estimated: 21 mL/min — ABNORMAL LOW (ref >60)
Glucose, Bld: 112 mg/dL — ABNORMAL HIGH (ref 70–99)
Potassium: 5.2 mmol/L — ABNORMAL HIGH (ref 3.5–5.1)
Sodium: 138 mmol/L (ref 135–145)
Total Bilirubin: 0.8 mg/dL (ref 0.3–1.2)
Total Protein: 8.5 g/dL — ABNORMAL HIGH (ref 6.5–8.1)

## 2020-01-30 MED ORDER — SODIUM CHLORIDE 0.9 % IV BOLUS
1000.0000 mL | Freq: Once | INTRAVENOUS | Status: AC
  Administered 2020-01-30: 1000 mL via INTRAVENOUS

## 2020-01-30 MED ORDER — PROMETHAZINE HCL 25 MG PO TABS: 25.0000 mg | ORAL_TABLET | Freq: Three times a day (TID) | ORAL | 0 refills | Status: DC | PRN

## 2020-01-30 MED ORDER — ONDANSETRON HCL 4 MG/2ML IJ SOLN
4.0000 mg | Freq: Once | INTRAMUSCULAR | Status: AC
  Administered 2020-01-30: 4 mg via INTRAVENOUS

## 2020-01-30 MED ORDER — ONDANSETRON HCL 4 MG/2ML IJ SOLN
INTRAMUSCULAR | Status: AC
  Filled 2020-01-30: qty 2

## 2020-01-30 NOTE — ED Notes (Signed)
Approx 600cc IVF NS infused. Pt states mild improvement in nausea. Sips of clear soda given.

## 2020-01-30 NOTE — ED Triage Notes (Addendum)
Pt presents with nausea and feeling weak x 2 weeks. States she is no able to eat as she get nauseous and the saliva ai "foamy". Denies fever, sob, cough.   Pt reports she was taking cephalexin 1 week ago, prescribed by her PCP, she was having drainage in the legs, pt has lymphedema.   Pt not taking her regular medications for the past week.

## 2020-01-30 NOTE — ED Notes (Signed)
1000cc NS IVF infused. Pt tolerating sips of water. IV d/c'd intact

## 2020-01-30 NOTE — Discharge Instructions (Addendum)
See Dr Quay Burow Friday at 10:45 If you get worse go to the ER It has been advised for you to be admitted today, although you decline  Take phenergan as needed for nausea DRINK MORE WATER Take the protonix ( pantoprazole)once a day for reflux You may hold the other medicines until you are seen

## 2020-01-30 NOTE — Telephone Encounter (Signed)
Message left for patient to return call to office to make an appointment to see Dr. Quay Burow.

## 2020-01-30 NOTE — ED Provider Notes (Signed)
[facility]    CSN: [id number] Arrival date & time: 01/30/20  1696      History   Chief Complaint Chief Complaint  Patient presents with  . Nausea  . Weakness    HPI [name] [name] is a 74 y.o. female.   HPI  Pleasant 74 year old woman with multiple medical problems.  Past medical history and chart are reviewed.  She had a recent medical visit with her provider on 01/15/2020.  She is found to have urinary tract infection and treated with Keflex.  She has chronic venous stasis edema and her legs are swollen, weeping, and require wraps.  At the time of her visit she appeared dehydrated.  Blood work was done and she did have a bump in her creatinine, decrease in GFR.  She was given Zofran to help with the nausea so she can keep down more food and fluids.  Unfortunately this has not helped her. Patient states that she has been feeling progressively more weak for the last 2 weeks.  She states that she is continuously nauseated.  She has not been eating.  She has not been drinking.  She has not been taking her medications.  She states that her saliva feels "foamy".  She lives alone.  She has decreased urination.  I discussed with patient my concerns that she may have additional kidney impairment and might need to go to the hospital, or be hospitalized.  She absolutely refuses. She denies chest pain or shortness of breath.  No fever or chills.  She has had her 2 Covid vaccines but is waiting for her booster. Patient has had similar symptoms multiple times before.  Chart is reviewed and she had hospitalization in 2019 and 2020, even previously for nausea vomiting and dehydration with acute kidney injury.  She responds to fluids.  Past Medical History:  Diagnosis Date  . Acute kidney injury ([place])   . Aortic stenosis, mild 11/17/2013  . Arthritis    "both knees, spine, back, fingers" (11/29/2013)  . CHF (congestive heart failure) ([place])   . Edema   . GERD (gastroesophageal reflux  disease)   . Heart murmur   . History of diastolic dysfunction    Echo 10/8936 with diastolic dysfunction  . HTN (hypertension)   . Morbid obesity ([place])   . OA (osteoarthritis)   . PAC (premature atrial contraction)    per prior Holter  . Palpitations   . Pneumonia    "as a child"  . PVC's (premature ventricular contractions)    per prior Holter  . SVT (supraventricular tachycardia) ([place])   . Tachycardia    noted at 07/28/11 visit. Started on beta blocker. Possible atrial flutter vs long PR tachycardia/AVNRT  . Type II diabetes mellitus [facility])     Patient Active Problem List   Diagnosis Date Noted  . Bilateral leg ulcer, limited to breakdown of skin ([place]) 01/16/2020  . Decreased urination 01/16/2020  . Dehydration 01/16/2020  . Allergic rhinitis 07/28/2019  . Postnasal drip 11/16/2017  . Sensorineural hearing loss (SNHL), bilateral 11/16/2017  . Tinnitus of both ears 11/16/2017  . Obesity, Class II, BMI 35-39.9, isolated (see actual BMI) 11/08/2017  . CKD (chronic kidney disease), stage III ([place]) 11/08/2017  . Anemia 11/07/2017  . Thrombocytosis 11/07/2017  . Lymphedema 11/07/2017  . Routine general medical examination at a health care facility 05/10/2014  . Paroxysmal SVT (supraventricular tachycardia) ([place]) 11/28/2013  . Physical deconditioning 11/23/2013  . Generalized weakness 11/17/2013  . Nausea 11/17/2013  .  Multilevel spine pain 11/17/2013  . Aortic stenosis, mild 11/17/2013  . DM type 2 (diabetes mellitus, type 2) ([place]) 02/02/2013  . Arthritis 10/19/2012  . Mixed incontinence 10/19/2012  . GERD (gastroesophageal reflux disease) 04/20/2011  . HTN (hypertension) 01/20/2011  . Morbid obesity ([place])     Past Surgical History:  Procedure Laterality Date  . CESAREAN SECTION  1985  . CESAREAN SECTION  1985  . SUPRAVENTRICULAR TACHYCARDIA ABLATION  11/29/2013  . SUPRAVENTRICULAR TACHYCARDIA ABLATION N/A 11/29/2013   Procedure: SUPRAVENTRICULAR TACHYCARDIA ABLATION;   Surgeon: Evans Lance, MD;  Location: [facility] CATH LAB;  Service: Cardiovascular;  Laterality: N/A;  . US ECHOCARDIOGRAPHY  07/25/2008   EF 55-60%  . VAGINAL DELIVERY      OB History   No obstetric history on file.      Home Medications    Prior to Admission medications   Medication Sig Start Date End Date Taking? Authorizing Provider  acetaminophen (TYLENOL) 325 MG tablet Take 2 tablets (650 mg total) by mouth every 6 (six) hours as needed for mild pain (or Fever >/= 101). 11/22/13   Delfina Redwood, MD  aspirin EC 81 MG tablet Take 81 mg by mouth daily with breakfast. Takes once or twice weekly    [provider]  celecoxib (CELEBREX) 100 MG capsule TAKE 1 CAPSULE BY MOUTH TWICE A DAY 01/11/20   Hoyt Koch, MD  feeding supplement, ENSURE COMPLETE, (ENSURE COMPLETE) LIQD Take 237 mLs by mouth 2 (two) times daily between meals. 07/28/19   Hoyt Koch, MD  furosemide (LASIX) 40 MG tablet TAKE 1 TABLET BY MOUTH EVERY DAY 04/10/19   Hoyt Koch, MD  losartan (COZAAR) 25 MG tablet TAKE 4 TABLETS (100 MG TOTAL) BY MOUTH DAILY. 05/08/19   Hoyt Koch, MD  metFORMIN (GLUCOPHAGE) 500 MG tablet Take 1 tablet (500 mg total) by mouth daily with breakfast. 05/08/19   Hoyt Koch, MD  methocarbamol (ROBAXIN) 500 MG tablet Take 1 tablet (500 mg total) by mouth every 6 (six) hours as needed for muscle spasms. 04/12/17   Hoyt Koch, MD  metoprolol tartrate (LOPRESSOR) 50 MG tablet TAKE 1 TABLET BY MOUTH TWICE A DAY 05/08/19   Hoyt Koch, MD  montelukast (SINGULAIR) 10 MG tablet Take 1 tablet (10 mg total) by mouth at bedtime. 07/28/19   Hoyt Koch, MD  Neomycin-Bacitracin-Polymyxin ([employer] ANTIBIOTIC) [id number] [place] LGS Patient taking differently: Apply 1 application topically daily. Apply after cleaning before wrapping lgs 11/23/16   Hoyt Koch, MD  nutrition supplement, JUVEN,  (JUVEN) PACK Take 1 packet by mouth 2 (two) times daily between meals. 07/28/19   Hoyt Koch, MD  ondansetron (ZOFRAN) 4 MG tablet Take 1 tablet (4 mg total) by mouth every 8 (eight) hours as needed for nausea or vomiting. 01/06/19   Hoyt Koch, MD  pantoprazole (PROTONIX) 40 MG tablet TAKE 1 TABLET BY MOUTH EVERY DAY 04/10/19   Hoyt Koch, MD  polyethylene glycol [facility] / GLYCOLAX) packet Take 17 g by mouth daily. 11/09/17   Charlynne Cousins, MD  promethazine (PHENERGAN) 25 MG tablet Take 1 tablet (25 mg total) by mouth every 8 (eight) hours as needed for refractory nausea / vomiting. 01/30/20   Raylene Everts, MD  RESTASIS 0.05 % ophthalmic emulsion 1 drop 2 (two) times daily. 10/10/19   [provider]    Family History Family History  Problem Relation Age of Onset  .  Alzheimer's disease Mother   . Lung cancer Mother   . COPD Father   . Diabetes Maternal Uncle   . Stroke Neg Hx   . Colon cancer Neg Hx   . Stomach cancer Neg Hx   . Esophageal cancer Neg Hx     Social History Social History   Tobacco Use  . Smoking status: Former Smoker    Packs/day: 2.00    Years: 15.00    Pack years: 30.00    Types: Cigarettes    Quit date: 11/23/1985    Years since quitting: 34.2  . Smokeless tobacco: Never Used  Vaping Use  . Vaping Use: Never used  Substance Use Topics  . Alcohol use: No  . Drug use: No     Allergies   Oxycodone hcl and Penicillins   Review of Systems Review of Systems See HPI  Physical Exam Triage Vital Signs ED Triage Vitals  Enc Vitals Group     BP 01/30/20 0917 127/70     Pulse Rate 01/30/20 0917 95     Resp 01/30/20 0917 20     Temp 01/30/20 0917 98.1 F (36.7 C)     Temp Source 01/30/20 0917 Oral     SpO2 01/30/20 0917 99 %     Weight --      Height --      Head Circumference --      Peak Flow --      Pain Score 01/30/20 0914 0     Pain Loc --      Pain Edu? --      Excl. in [place]? --    No data  found.  Updated Vital Signs BP 127/70 (BP Location: Right Arm)   Pulse 95   Temp 98.1 F (36.7 C) (Oral)   Resp 20   SpO2 99%      Physical Exam Vitals and nursing note reviewed.  Constitutional:      General: She is not in acute distress.    Appearance: She is well-developed. She is obese.     Comments: Patient appears tired  HENT:     Head: Normocephalic and atraumatic.     Mouth/Throat:     Mouth: Mucous membranes are dry.  Eyes:     Conjunctiva/sclera: Conjunctivae normal.     Pupils: Pupils are equal, round, and reactive to light.  Cardiovascular:     Rate and Rhythm: Normal rate and regular rhythm.     Heart sounds: Normal heart sounds.  Pulmonary:     Effort: Pulmonary effort is normal. No respiratory distress.     Breath sounds: Normal breath sounds. No wheezing or rales.  Abdominal:     General: There is no distension.     Palpations: Abdomen is soft.     Tenderness: There is no abdominal tenderness.  Musculoskeletal:        General: Normal range of motion.     Cervical back: Normal range of motion.  Skin:    General: Skin is warm and dry.     Comments: Skin tenting is present  Neurological:     Mental Status: She is alert.  Psychiatric:        Behavior: Behavior normal.      UC Treatments / Results  Labs (all labs ordered are listed, but only abnormal results are displayed) Labs Reviewed  CBC WITH DIFFERENTIAL/PLATELET - Abnormal; Notable for the following components:      Result Value   Hemoglobin 11.9 (*)  MCV 79.1 (*)    MCH 24.3 (*)    RDW 17.3 (*)    All other components within normal limits  COMPREHENSIVE METABOLIC PANEL - Abnormal; Notable for the following components:   Potassium 5.2 (*)    Glucose, Bld 112 (*)    BUN 43 (*)    Creatinine, Ser 2.42 (*)    Total Protein 8.5 (*)    Albumin 3.4 (*)    GFR, Estimated 21 (*)    All other components within normal limits  POCT URINALYSIS DIPSTICK, ED / UC   Patient unable to provide  urine specimen EKG   Radiology No results found.  Procedures Procedures (including critical care time)  Medications Ordered in UC Medications  sodium chloride 0.9 % bolus 1,000 mL (0 mLs Intravenous Stopped 01/30/20 1205)  ondansetron (ZOFRAN) injection 4 mg (4 mg Intravenous Given 01/30/20 1040)    Initial Impression / Assessment and Plan / UC Course  I have reviewed the triage vital signs and the nursing notes.  Pertinent labs & imaging results that were available during my care of the patient were reviewed by me and considered in my medical decision making (see chart for details).    1 I received the patient's laboratory work and found that she was in acute kidney injury with mild hyperkalemia I called to her primary care office.  Dr. Billey Gosling kindly took the call.  She has not her PCP but has seen her most recently on 01/15/2020.  We agreed that the patient has failed home treatment and needs to be admitted to the hospital.  Patient absolutely refuses.  She states that she feels better after her liter of fluids.  She feels like if she has Phenergan that she will be able to combat the nausea and keep down more fluids.  I explained to her that the Phenergan does have high risk than the Zofran, but will provide her a few of these tablets since she has successful in the past. Patient is sent home on her Protonix, and Phenergan.  She will push fluids. I secured an.  Appointment for her on Friday with her primary care office She voices understanding of the need to go to the emergency room if she is worse instead of better at any time Final Clinical Impressions(s) / UC Diagnoses   Final diagnoses:  Generalized weakness  Nausea  Dehydration  AKI (acute kidney injury) [facility])     Discharge Instructions     See Dr Quay Burow Friday at 10:45 If you get worse go to the ER It has been advised for you to be admitted today, although you decline  Take phenergan as needed for nausea DRINK  MORE WATER Take the protonix ( pantoprazole)once a day for reflux You may hold the other medicines until you are seen     ED Prescriptions    Medication Sig Dispense Auth. Provider   promethazine (PHENERGAN) 25 MG tablet Take 1 tablet (25 mg total) by mouth every 8 (eight) hours as needed for refractory nausea / vomiting. 15 tablet Raylene Everts, MD     PDMP not reviewed this encounter.   Raylene Everts, MD 01/30/20 [phone]

## 2020-01-31 ENCOUNTER — Other Ambulatory Visit: Payer: Self-pay | Admitting: Internal Medicine

## 2020-02-01 NOTE — Progress Notes (Signed)
Subjective:    Patient ID: [name] [name], female    DOB: 1945-10-29, 74 y.o.   MRN: [id number]  HPI The patient is here for follow up from urgent care.  For the past two weeks she has been getting weaker.  She has constant nausea.  She is not eating.    Urgent care 10/26 - IVF, zofran. Her kidney function was worse.  Declined going to hospital.    This is the third time something like this has happened.  She does not know what triggers it.  Looking through her chart at least 3 times this has happened, but looks like more - a couple seem to have been related to an infection.     Yesterday she ate and had diarrhea all this monring.  She was able to keep a little food down.  She is trying to drink a lot of fluids.   Medications and allergies reviewed with patient and updated if appropriate.  Patient Active Problem List   Diagnosis Date Noted  . Bilateral leg ulcer, limited to breakdown of skin ([place]) 01/16/2020  . Decreased urination 01/16/2020  . Dehydration 01/16/2020  . Allergic rhinitis 07/28/2019  . Postnasal drip 11/16/2017  . Sensorineural hearing loss (SNHL), bilateral 11/16/2017  . Tinnitus of both ears 11/16/2017  . Obesity, Class II, BMI 35-39.9, isolated (see actual BMI) 11/08/2017  . CKD (chronic kidney disease), stage III ([place]) 11/08/2017  . Anemia 11/07/2017  . Thrombocytosis 11/07/2017  . Lymphedema 11/07/2017  . Routine general medical examination at a health care facility 05/10/2014  . Paroxysmal SVT (supraventricular tachycardia) ([place]) 11/28/2013  . Physical deconditioning 11/23/2013  . Generalized weakness 11/17/2013  . Nausea 11/17/2013  . Multilevel spine pain 11/17/2013  . Aortic stenosis, mild 11/17/2013  . DM type 2 (diabetes mellitus, type 2) ([place]) 02/02/2013  . Arthritis 10/19/2012  . Mixed incontinence 10/19/2012  . GERD (gastroesophageal reflux disease) 04/20/2011  . HTN (hypertension) 01/20/2011  . Morbid obesity ([place])     Current  Outpatient Medications on File Prior to Visit  Medication Sig Dispense Refill  . acetaminophen (TYLENOL) 325 MG tablet Take 2 tablets (650 mg total) by mouth every 6 (six) hours as needed for mild pain (or Fever >/= 101).    Mar[place] Kitchen aspirin EC 81 MG tablet Take 81 mg by mouth daily with breakfast. Takes once or twice weekly    . celecoxib (CELEBREX) 100 MG capsule TAKE 1 CAPSULE BY MOUTH TWICE A DAY 180 capsule 0  . feeding supplement, ENSURE COMPLETE, (ENSURE COMPLETE) LIQD Take 237 mLs by mouth 2 (two) times daily between meals. 13272 mL 11  . furosemide (LASIX) 40 MG tablet TAKE 1 TABLET BY MOUTH EVERY DAY 90 tablet 3  . losartan (COZAAR) 25 MG tablet TAKE 4 TABLETS (100 MG TOTAL) BY MOUTH DAILY. 360 tablet 3  . metFORMIN (GLUCOPHAGE) 500 MG tablet Take 1 tablet (500 mg total) by mouth daily with breakfast. 90 tablet 3  . methocarbamol (ROBAXIN) 500 MG tablet Take 1 tablet (500 mg total) by mouth every 6 (six) hours as needed for muscle spasms. 30 tablet 0  . metoprolol tartrate (LOPRESSOR) 50 MG tablet TAKE 1 TABLET BY MOUTH TWICE A DAY 180 tablet 3  . montelukast (SINGULAIR) 10 MG tablet Take 1 tablet (10 mg total) by mouth at bedtime. 90 tablet 3  . Neomycin-Bacitracin-Polymyxin ([employer] ANTIBIOTIC) [phone] OINT APPLY AFTER CLEANING BEFORE WRAPPING LGS (Patient taking differently: Apply 1 application topically daily. Apply after cleaning before wrapping  lgs) 453.9 g 0  . nutrition supplement, JUVEN, (JUVEN) PACK Take 1 packet by mouth 2 (two) times daily between meals. 180 packet 3  . ondansetron (ZOFRAN) 4 MG tablet Take 1 tablet (4 mg total) by mouth every 8 (eight) hours as needed for nausea or vomiting. 30 tablet 1  . pantoprazole (PROTONIX) 40 MG tablet TAKE 1 TABLET BY MOUTH EVERY DAY 90 tablet 3  . polyethylene glycol (MIRALAX / GLYCOLAX) packet Take 17 g by mouth daily. 14 each 0  . promethazine (PHENERGAN) 25 MG tablet Take 1 tablet (25 mg total) by mouth every 8 (eight) hours as needed  for refractory nausea / vomiting. 15 tablet 0  . RESTASIS 0.05 % ophthalmic emulsion 1 drop 2 (two) times daily.     No current facility-administered medications on file prior to visit.    Past Medical History:  Diagnosis Date  . Acute kidney injury ([place])   . Aortic stenosis, mild 11/17/2013  . Arthritis    "both knees, spine, back, fingers" (11/29/2013)  . CHF (congestive heart failure) ([place])   . Edema   . GERD (gastroesophageal reflux disease)   . Heart murmur   . History of diastolic dysfunction    Echo 05/4095 with diastolic dysfunction  . HTN (hypertension)   . Morbid obesity ([place])   . OA (osteoarthritis)   . PAC (premature atrial contraction)    per prior Holter  . Palpitations   . Pneumonia    "as a child"  . PVC's (premature ventricular contractions)    per prior Holter  . SVT (supraventricular tachycardia) ([place])   . Tachycardia    noted at 07/28/11 visit. Started on beta blocker. Possible atrial flutter vs long PR tachycardia/AVNRT  . Type II diabetes mellitus ([place])     Past Surgical History:  Procedure Laterality Date  . CESAREAN SECTION  1985  . CESAREAN SECTION  1985  . SUPRAVENTRICULAR TACHYCARDIA ABLATION  11/29/2013  . SUPRAVENTRICULAR TACHYCARDIA ABLATION N/A 11/29/2013   Procedure: SUPRAVENTRICULAR TACHYCARDIA ABLATION;  Surgeon: Evans Lance, MD;  Location[facility] CATH LAB;  Service: Cardiovascular;  Laterality: N/A;  . US ECHOCARDIOGRAPHY  07/25/2008   EF 55-60%  . VAGINAL DELIVERY      Social History   Socioeconomic History  . Marital status: Single    Spouse name: Not on file  . Number of children: 2  . Years of education: Not on file  . Highest education level: Not on file  Occupational History  . 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 Smoking status: Former Smoker    Packs/day: 2.00    Years: 15.00    Pack years: 30.00    Types: Cigarettes    Quit date: 11/23/1985    Years since quitting:  34.2  . Smokeless tobacco: Never Used  Vaping Use  . Vaping Use: Never used  Substance and Sexual Activity  . Alcohol use: No  . Drug use: No  . Sexual activity: Never  Other Topics Concern  . Not on file  Social History Narrative  . Not on file   Social Determinants of Health   Financial Resource Strain:   . Difficulty of Paying Living Expenses: Not on file  Food Insecurity:   . Worried About Charity fundraiser in the Last Year: Not on file  . Ran Out of Food in the Last Year: Not on file  Transportation Needs:   . Lack of  Transportation (Medical): Not on file  . Lack of Transportation (Non-Medical): Not on file  Physical Activity:   . Days of Exercise per Week: Not on file  . Minutes of Exercise per Session: Not on file  Stress:   . Feeling of Stress : Not on file  Social Connections:   . Frequency of Communication with Friends and Family: Not on file  . Frequency of Social Gatherings with Friends and Family: Not on file  . Attends Religious Services: Not on file  . Active Member of Clubs or Organizations: Not on file  . Attends Archivist Meetings: Not on file  . Marital Status: Not on file    Family History  Problem Relation Age of Onset  . Alzheimer's disease Mother   . Lung cancer Mother   . COPD Father   . Diabetes Maternal Uncle   . Stroke Neg Hx   . Colon cancer Neg Hx   . Stomach cancer Neg Hx   . Esophageal cancer Neg Hx     Review of Systems  Constitutional: Positive for appetite change (no appetite). Negative for chills and fever.  HENT: Negative for congestion, ear pain, sinus pressure, sinus pain and sore throat.   Respiratory: Positive for shortness of breath (from weakness). Negative for cough and wheezing.   Gastrointestinal: Positive for diarrhea (today only) and nausea. Negative for abdominal pain, blood in stool and vomiting.  Genitourinary: Negative for dysuria and hematuria.  Musculoskeletal: Positive for arthralgias (chronic -  not more than usual).  Skin: Negative for rash.  Neurological: Negative for light-headedness and headaches.       Objective:   Vitals:   02/02/20 1103  BP: 120/68  Pulse: 89  Temp: 97.9 F (36.6 C)  SpO2: 98%   BP Readings from Last 3 Encounters:  02/02/20 120/68  01/30/20 127/70  01/16/20 136/78   Wt Readings from Last 3 Encounters:  02/02/20 261 lb (118.4 kg)  01/16/20 267 lb (121.1 kg)  07/28/19 (!) 310 lb 3.2 oz (140.7 kg)   Body mass index is 36.4 kg/m.   Physical Exam    Constitutional: Appears mildly ill. No acute distress.  HENT:  Head: Normocephalic and atraumatic.  Neck: Neck supple. No tracheal deviation present. No thyromegaly present.  No cervical lymphadenopathy Cardiovascular: Normal rate, regular rhythm and normal heart sounds.   No murmur heard. No carotid bruit .  Mild b/l LE edema - improved.  Ulcers have improved.  No drainage Pulmonary/Chest: Effort normal and breath sounds normal. No respiratory distress. No has no wheezes. No rales. Abdomen: soft, NT, ND  Skin: Skin is warm and dry. Not diaphoretic.    Reviewed labs and notes from urgent care.  Reviewed past admissions into the hospital where she had similar symptoms.   Assessment & Plan:    See Problem List for Assessment and Plan of chronic medical problems.    This visit occurred during the SARS-CoV-2 public health emergency.  Safety protocols were in place, including screening questions prior to the visit, additional usage of staff PPE, and extensive cleaning of exam room while observing appropriate contact time as indicated for disinfecting solutions.

## 2020-02-02 ENCOUNTER — Other Ambulatory Visit: Payer: Self-pay

## 2020-02-02 ENCOUNTER — Encounter: Payer: Self-pay | Admitting: Internal Medicine

## 2020-02-02 ENCOUNTER — Ambulatory Visit (INDEPENDENT_AMBULATORY_CARE_PROVIDER_SITE_OTHER): Payer: PPO | Admitting: Internal Medicine

## 2020-02-02 VITALS — BP 120/68 | HR 89 | Temp 97.9°F | Ht 71.0 in | Wt 261.0 lb

## 2020-02-02 DIAGNOSIS — N179 Acute kidney failure, unspecified: Secondary | ICD-10-CM | POA: Diagnosis not present

## 2020-02-02 DIAGNOSIS — E860 Dehydration: Secondary | ICD-10-CM | POA: Diagnosis not present

## 2020-02-02 DIAGNOSIS — E119 Type 2 diabetes mellitus without complications: Secondary | ICD-10-CM | POA: Diagnosis not present

## 2020-02-02 DIAGNOSIS — R34 Anuria and oliguria: Secondary | ICD-10-CM

## 2020-02-02 DIAGNOSIS — R531 Weakness: Secondary | ICD-10-CM | POA: Diagnosis not present

## 2020-02-02 LAB — URINALYSIS, ROUTINE W REFLEX MICROSCOPIC
Hgb urine dipstick: NEGATIVE
Leukocytes,Ua: NEGATIVE
Nitrite: POSITIVE — AB
RBC / HPF: NONE SEEN (ref 0–?)
Specific Gravity, Urine: >=1.03 — AB (ref 1.000–1.030)
Urine Glucose: NEGATIVE
Urobilinogen, UA: 0.2 (ref 0.0–1.0)
pH: 5 (ref 5.0–8.0)

## 2020-02-02 LAB — CBC WITH DIFFERENTIAL/PLATELET
Basophils Absolute: 0.1 10*3/uL (ref 0.0–0.1)
Basophils Relative: 1.8 % (ref 0.0–3.0)
Eosinophils Absolute: 0.1 10*3/uL (ref 0.0–0.7)
Eosinophils Relative: 2.4 % (ref 0.0–5.0)
HCT: 35.6 % — ABNORMAL LOW (ref 36.0–46.0)
Hemoglobin: 11.5 g/dL — ABNORMAL LOW (ref 12.0–15.0)
Lymphocytes Relative: 24.5 % (ref 12.0–46.0)
Lymphs Abs: 1.4 10*3/uL (ref 0.7–4.0)
MCHC: 32.3 g/dL (ref 30.0–36.0)
MCV: 77 fl — ABNORMAL LOW (ref 78.0–100.0)
Monocytes Absolute: 0.5 10*3/uL (ref 0.1–1.0)
Monocytes Relative: 9.4 % (ref 3.0–12.0)
Neutro Abs: 3.5 10*3/uL (ref 1.4–7.7)
Neutrophils Relative %: 61.9 % (ref 43.0–77.0)
Platelets: 321 10*3/uL (ref 150.0–400.0)
RBC: 4.63 Mil/uL (ref 3.87–5.11)
RDW: 18.5 % — ABNORMAL HIGH (ref 11.5–15.5)
WBC: 5.6 10*3/uL (ref 4.0–10.5)

## 2020-02-02 LAB — COMPREHENSIVE METABOLIC PANEL
ALT: 10 U/L (ref 0–35)
AST: 22 U/L (ref 0–37)
Albumin: 3.7 g/dL (ref 3.5–5.2)
Alkaline Phosphatase: 70 U/L (ref 39–117)
BUN: 39 mg/dL — ABNORMAL HIGH (ref 6–23)
CO2: 23 mEq/L (ref 19–32)
Calcium: 9.6 mg/dL (ref 8.4–10.5)
Chloride: 106 mEq/L (ref 96–112)
Creatinine, Ser: 2.06 mg/dL — ABNORMAL HIGH (ref 0.40–1.20)
GFR: 23.51 mL/min — ABNORMAL LOW (ref >60.00)
Glucose, Bld: 86 mg/dL (ref 70–99)
Potassium: 4.6 mEq/L (ref 3.5–5.1)
Sodium: 139 mEq/L (ref 135–145)
Total Bilirubin: 0.4 mg/dL (ref 0.2–1.2)
Total Protein: 8.5 g/dL — ABNORMAL HIGH (ref 6.0–8.3)

## 2020-02-02 LAB — HEMOGLOBIN A1C: Hgb A1c MFr Bld: 6.8 % — ABNORMAL HIGH (ref 4.6–6.5)

## 2020-02-02 NOTE — Patient Instructions (Signed)
Continue the pantoprazole daily.   Take either the zofran or promethazine pill for your nausea.   You need to drink as much fluids as possible.  You need to eat small, frequent meals.     Have blood work done today.

## 2020-02-03 NOTE — Assessment & Plan Note (Signed)
Acute GFR worsened when check at urgent care - received IVF Will recheck cmp today Stressed increasing fluids Take zofran / promethazine prn to keep fluids down

## 2020-02-03 NOTE — Assessment & Plan Note (Signed)
Acute She is still dehydrated.  Stressed that she needs to drink more fluids Advised her to take Zofran or promethazine every 8 hours to help her keep fluids down She refused to go to the hospital for fluids

## 2020-02-03 NOTE — Assessment & Plan Note (Signed)
Acute Still has decreased urination, which is likely related related to dehydration Has had UTIs in the past We checked her urine when she was here last I did look like it was infected, but culture came out negative We will recheck urinalysis, urine culture-?  UTI that is causing some of her symptoms

## 2020-02-03 NOTE — Assessment & Plan Note (Signed)
Acute Generalized weakness secondary to?  Infection.  Lack of food and dehydration are likely contributing.  Acute kidney insufficiency on top of chronic kidney disease is likely contributing as well ?  Initial cause of all of her symptoms Stressed taking the antinausea medication so that she can eat and drink to improve her kidney function, hydration and overall strength

## 2020-02-03 NOTE — Assessment & Plan Note (Signed)
Chronic Metformin on hold - will not restart until better Check a1c

## 2020-02-04 LAB — URINE CULTURE

## 2020-02-05 MED ORDER — CIPROFLOXACIN HCL 250 MG PO TABS: 250.0000 mg | ORAL_TABLET | Freq: Two times a day (BID) | ORAL | 0 refills | Status: DC

## 2020-02-05 NOTE — Addendum Note (Signed)
Addended by: Binnie Rail on: 02/05/2020 07:17 AM   Modules accepted: Orders

## 2020-02-13 ENCOUNTER — Other Ambulatory Visit: Payer: Self-pay

## 2020-02-13 ENCOUNTER — Encounter (HOSPITAL_BASED_OUTPATIENT_CLINIC_OR_DEPARTMENT_OTHER): Payer: PPO | Attending: Internal Medicine | Admitting: Internal Medicine

## 2020-02-13 DIAGNOSIS — L97821 Non-pressure chronic ulcer of other part of left lower leg limited to breakdown of skin: Secondary | ICD-10-CM | POA: Insufficient documentation

## 2020-02-13 DIAGNOSIS — L97811 Non-pressure chronic ulcer of other part of right lower leg limited to breakdown of skin: Secondary | ICD-10-CM | POA: Insufficient documentation

## 2020-02-13 DIAGNOSIS — E11622 Type 2 diabetes mellitus with other skin ulcer: Secondary | ICD-10-CM | POA: Diagnosis not present

## 2020-02-13 DIAGNOSIS — I5032 Chronic diastolic (congestive) heart failure: Secondary | ICD-10-CM | POA: Insufficient documentation

## 2020-02-13 DIAGNOSIS — Z87891 Personal history of nicotine dependence: Secondary | ICD-10-CM | POA: Insufficient documentation

## 2020-02-13 DIAGNOSIS — I890 Lymphedema, not elsewhere classified: Secondary | ICD-10-CM | POA: Diagnosis not present

## 2020-02-13 DIAGNOSIS — E860 Dehydration: Secondary | ICD-10-CM | POA: Insufficient documentation

## 2020-02-13 DIAGNOSIS — R112 Nausea with vomiting, unspecified: Secondary | ICD-10-CM | POA: Insufficient documentation

## 2020-02-14 NOTE — Progress Notes (Signed)
[name] [name], [name] [name] ([id number]) Visit Report for 02/13/2020 Abuse/Suicide Risk Screen Details Patient Name: Date of Service: [name] [name], [name] [name] 02/13/2020 2:45 PM Medical Record Number: [id number] Patient Account Number: [id number] Date of Birth/Sex: Treating RN: May 05, 1945 (74 y.o. Elam Dutch Primary Care Kees Idrovo: [name] Other Clinician: Referring Sina Lucchesi: Treating Ahmarion Saraceno/Extender: [name] in Treatment: 0 Abuse/Suicide Risk Screen Items Answer ABUSE RISK SCREEN: Has anyone close to you tried to hurt or harm you recentlyo No Do you feel uncomfortable with anyone in your familyo No Has anyone forced you do things that you didnt want to doo No Electronic Signature(s) Signed: 02/14/2020 5:57:36 PM By: Baruch Gouty RN, BSN Entered By: Baruch Gouty on 02/13/2020 15:31:57 -------------------------------------------------------------------------------- Activities of Daily Living Details Patient Name: Date of Service: [name] [name], [name] [name] 02/13/2020 2:45 PM Medical Record Number: [id number] Patient Account Number: [id number] Date of Birth/Sex: Treating RN: [birth year] (74 y.o. Elam Dutch Primary Care Anaid Haney: [name] Other Clinician: Referring Zulema Pul[place]: Treating Shaquana Buel/Extender: [name] in Treatment: 0 Activities of Daily Living Items Answer Activities of Daily Living (Please select one for each item) Drive Automobile Not Able T Medications ake Completely Able Use T elephone Completely Able Care for Appearance Completely Able Use T oilet Completely Able Bath / Shower Completely Able Dress Self Completely Able Feed Self Completely Able Walk Need Assistance Get In / Out Bed Completely Able Housework Completely Able Prepare Meals Completely [place] for Self Completely Able Electronic Signature(s) Signed: 02/14/2020 5:57:36 PM By:  Baruch Gouty RN, BSN Entered By: Baruch Gouty on 02/13/2020 15:32:28 -------------------------------------------------------------------------------- Education Screening Details Patient Name: Date of Service: [name] [name], [name] [name] 02/13/2020 2:45 PM Medical Record Number: [id number] Patient Account Number: [id number] Date of Birth/Sex: Treating RN: 1945/12/27 (74 y.o. Elam Dutch Primary Care Taquilla Downum: [name] Other Clinician: Referring Clessie Karras: Treating Shaheed Schmuck/Extender: [name] in Treatment: 0 Primary Learner Assessed: Patient Learning Preferences/Education Level/Primary Language Learning Preference: Explanation, Demonstration, Printed Material Highest Education Level: College or Above Preferred Language: English Cognitive Barrier Language Barrier: No Translator Needed: No Memory Deficit: No Emotional Barrier: No Cultural/Religious Beliefs Affecting Medical Care: No Physical Barrier Impaired Vision: Yes Glasses Impaired Hearing: No Decreased Hand dexterity: No Knowledge/Comprehension Knowledge Level: High Comprehension Level: High Ability to understand written instructions: High Ability to understand verbal instructions: High Motivation Anxiety Level: Calm Cooperation: Cooperative Education Importance: Acknowledges Need Interest in Health Problems: Asks Questions Perception: Coherent Willingness to Engage in Self-Management High Activities: Readiness to Engage in Self-Management High Activities: Electronic Signature(s) Signed: 02/14/2020 5:57:36 PM By: Baruch Gouty RN, BSN Entered By: Baruch Gouty on 02/13/2020 15:33:09 -------------------------------------------------------------------------------- Fall Risk Assessment Details Patient Name: Date of Service: [name] [name], [name] [name] 02/13/2020 2:45 PM Medical Record Number: [id number] Patient Account Number: [id number] Date of Birth/Sex: Treating  RN: 11/05/1945 (74 y.o. Elam Dutch Primary Care Tommy Goostree: [name] Other Clinician: Referring Blondine Hottel: Treating Rehan Holness/Extender: [name] in Treatment: 0 Fall Risk Assessment Items Have you had 2 or more falls in the last 12 monthso 0 No Have you had any fall that resulted in injury in the last 12 monthso 0 No FALLS RISK SCREEN History of falling - immediate or within 3 months 0 No Secondary diagnosis (Do you have 2 or more medical diagnoseso) 0 No Ambulatory aid None/bed rest/wheelchair/nurse 0 No Crutches/cane/walker 15 Yes Furniture 0 No Intravenous therapy Access/Saline/Heparin Lock 0 No Gait/Transferring Normal/ bed rest/ wheelchair 0 Yes Weak (short steps  with or without shuffle, stooped but able to lift head while walking, may seek 0 No support from furniture) Impaired (short steps with shuffle, may have difficulty arising from chair, head down, impaired 0 No balance) Mental Status Oriented to own ability 0 Yes Electronic Signature(s) Signed: 02/14/2020 5:57:36 PM By: Baruch Gouty RN, BSN Entered By: Baruch Gouty on 02/13/2020 15:33:25 -------------------------------------------------------------------------------- Foot Assessment Details Patient Name: Date of Service: [name] [name], [name] [name] 02/13/2020 2:45 PM Medical Record Number: [id number] Patient Account Number: [id number] Date of Birth/Sex: Treating RN: Sep 12, 1945 (74 y.o. Elam Dutch Primary Care Rosealynn Mateus: [name] Other Clinician: Referring Evita Merida: Treating Jennilyn Esteve/Extender: [name] in Treatment: 0 Foot Assessment Items Site Locations + = Sensation present, - = Sensation absent, C = Callus, U = Ulcer R = Redness, W = Warmth, M = Maceration, PU = Pre-ulcerative lesion F = Fissure, S = Swelling, D = Dryness Assessment Right: Left: Other Deformity: No No Prior Foot Ulcer: No No Prior Amputation:  No No Charcot Joint: No No Ambulatory Status: Ambulatory With Help Assistance Device: Walker Gait: Steady Electronic Signature(s) Signed: 02/14/2020 5:57:36 PM By: Baruch Gouty RN, BSN Entered By: Baruch Gouty on 02/13/2020 15:35:55 -------------------------------------------------------------------------------- Nutrition Risk Screening Details Patient Name: Date of Service: [name] [name], [name] [name] 02/13/2020 2:45 PM Medical Record Number: [id number] Patient Account Number: [id number] Date of Birth/Sex: Treating RN: 09-Apr-1945 (74 y.o. Elam Dutch Primary Care Ural Acree: [name] Other Clinician: Referring Marky Buresh: Treating Kerney Hopfensperger/Extender: [name] in Treatment: 0 Height (in): 71 Weight (lbs): 260 Body Mass Index (BMI): 36.3 Nutrition Risk Screening Items Score Screening NUTRITION RISK SCREEN: I have an illness or condition that made me change the kind and/or amount of food I eat 2 Yes I eat fewer than two meals per day 3 Yes I eat few fruits and vegetables, or milk products 2 Yes I have three or more drinks of beer, liquor or wine almost every day 0 No I have tooth or mouth problems that make it hard for me to eat 0 No I don't always have enough money to buy the food I need 0 No I eat alone most of the time 1 Yes I take three or more different prescribed or over-the-counter drugs [name] day 1 Yes Without wanting to, I have lost or gained 10 pounds in the last six months 0 No I am not always physically able to shop, cook and/or feed myself 0 No Nutrition Protocols Good Risk Protocol Moderate Risk Protocol High Risk Proctocol 0 Provide education on nutrition Risk Level: High Risk Score: 9 Electronic Signature(s) Signed: 02/14/2020 5:57:36 PM By: Baruch Gouty RN, BSN Entered By: Baruch Gouty on 02/13/2020 15:33:49

## 2020-02-14 NOTE — Progress Notes (Signed)
[name] [name], [name] [name] ([id number]) Visit Report for 02/13/2020 Allergy List Details Patient Name: Date of Service: [name] [name], [name] [name] 02/13/2020 2:45 PM Medical Record Number: [id number] Patient Account Number: [id number] Date of Birth/Sex: Treating RN: [birth year] (74 y.o. Elam Dutch Primary Care Dnya Hickle: [name] Other Clinician: Referring Jolon Degante: Treating Youlanda Tomassetti/Extender: [name] in Treatment: 0 Allergies Active Allergies oxycodone HCl Reaction: nausea, vomiting Severity: Mild Active: 11/24/2010 penicillin Reaction: itching Severity: Moderate Active: 11/24/2010 adhesive Reaction: rash Allergy Notes Electronic Signature(s) Signed: 02/14/2020 5:57:36 PM By: Baruch Gouty RN, BSN Entered By: Baruch Gouty on 02/13/2020 15:28:48 -------------------------------------------------------------------------------- Arrival Information Details Patient Name: Date of Service: [name] [name], [name] [name] 02/13/2020 2:45 PM Medical Record Number: [id number] Patient Account Number: [id number] Date of Birth/Sex: Treating RN: Mar 30, 1946 (74 y.o. Elam Dutch Primary Care Jermal Dismuke: [name] Other Clinician: Referring Honestee Revard: Treating Kaelene Elliston/Extender: [name] in Treatment: 0 Visit Information Patient Arrived: Charlyn Minerva Time: 15:09 Accompanied By: self Transfer Assistance: None Patient Identification Verified: Yes Secondary Verification Process Completed: Yes Patient Requires Transmission-Based Precautions: No Patient Has Alerts: Yes Patient Alerts: R ABI= 1.21, L ABI= 1.35 History Since Last Visit Has Compression in Place as Prescribed: Yes Electronic Signature(s) Signed: 02/14/2020 5:57:36 PM By: Baruch Gouty RN, BSN Entered By: Baruch Gouty on 02/13/2020 16:15:17 -------------------------------------------------------------------------------- Clinic Level of Care Assessment  Details Patient Name: Date of Service: [name] [name], [name] [name] 02/13/2020 2:45 PM Medical Record Number: [id number] Patient Account Number: [id number] Date of Birth/Sex: Treating RN: Dec 16, 1945 (74 y.o. [name] Primary Care Camari Quintanilla: [name] Other Clinician: Referring Keante Urizar: Treating Yolanda Dockendorf/Extender: [name] in Treatment: 0 Clinic Level of Care Assessment Items TOOL 1 Quantity Score X- 1 0 Use when EandM and Procedure is performed on INITIAL visit ASSESSMENTS - Nursing Assessment / Reassessment X- 1 20 General Physical Exam (combine w/ comprehensive assessment (listed just below) when performed on new pt. evals) X- 1 25 Comprehensive Assessment (HX, ROS, Risk Assessments, Wounds Hx, etc.) ASSESSMENTS - Wound and Skin Assessment / Reassessment []  - 0 Dermatologic / Skin Assessment (not related to wound area) ASSESSMENTS - Ostomy and/or Continence Assessment and Care []  - 0 Incontinence Assessment and Management []  - 0 Ostomy Care Assessment and Management (repouching, etc.) PROCESS - Coordination of Care X - Simple Patient / Family Education for ongoing care 1 15 []  - 0 Complex (extensive) Patient / Family Education for ongoing care X- 1 10 Staff obtains Programmer, systems, Records, T Results / Process Orders est []  - 0 Staff telephones HHA, Nursing Homes / Clarify orders / etc []  - 0 Routine Transfer to another Facility (non-emergent condition) []  - 0 Routine Hospital Admission (non-emergent condition) X- 1 15 New Admissions / Biomedical engineer / Ordering NPWT Apligraf, etc. , []  - 0 Emergency Hospital Admission (emergent condition) PROCESS - Special Needs []  - 0 Pediatric / Minor Patient Management []  - 0 Isolation Patient Management []  - 0 Hearing / Language / Visual special needs []  - 0 Assessment of Community assistance (transportation, D/C planning, etc.) []  - 0 Additional assistance / Altered mentation []  -  0 Support Surface(s) Assessment (bed, cushion, seat, etc.) INTERVENTIONS - Miscellaneous []  - 0 External ear exam []  - 0 Patient Transfer (multiple staff / Civil Service fast streamer / Similar devices) []  - 0 Simple Staple / Suture removal (25 or less) []  - 0 Complex Staple / Suture removal (26 or more) []  - 0 Hypo/Hyperglycemic Management (do not check if billed separately) X- 1 15 Ankle /  Brachial Index (ABI) - do not check if billed separately Has the patient been seen at the hospital within the last three years: Yes Total Score: 100 Level Of Care: New/Established - Level 3 Electronic Signature(s) Signed: 02/14/2020 5:51:14 PM By: Carlene Coria RN Entered By: Carlene Coria on 02/13/2020 17:20:35 -------------------------------------------------------------------------------- Compression Therapy Details Patient Name: Date of Service: [name] [name], [name] [name] 02/13/2020 2:45 PM Medical Record Number: [id number] Patient Account Number: [id number] Date of Birth/Sex: Treating RN: 12-Mar-1946 (74 y.o. [name] Primary Care Mitsuko Luera: [name] Other Clinician: Referring Tayjah Lobdell: Treating Tashawna Thom/Extender: [name] in Treatment: 0 Compression Therapy Performed for Wound Assessment: Wound #42 Left,Anterior Lower Leg Performed By: Clinician Carlene Coria, RN Compression Type: Four Layer Post Procedure Diagnosis Same as Pre-procedure Electronic Signature(s) Signed: 02/14/2020 5:51:14 PM By: Carlene Coria RN Entered By: Carlene Coria on 02/13/2020 17:21:00 -------------------------------------------------------------------------------- Compression Therapy Details Patient Name: Date of Service: [name] [name], [name] [name] 02/13/2020 2:45 PM Medical Record Number: [id number] Patient Account Number: [id number] Date of Birth/Sex: Treating RN: May 23, 1945 (75 y.o. [name] Primary Care Zayd Bonet: [name] Other Clinician: Referring Jamyson Jirak: Treating  Cathaleen Korol/Extender: [name] in Treatment: 0 Compression Therapy Performed for Wound Assessment: Wound #43 Left,Lateral Lower Leg Performed By: Clinician Carlene Coria, RN Compression Type: Four Layer Post Procedure Diagnosis Same as Pre-procedure Electronic Signature(s) Signed: 02/14/2020 5:51:14 PM By: Carlene Coria RN Entered By: Carlene Coria on 02/13/2020 17:21:00 -------------------------------------------------------------------------------- Compression Therapy Details Patient Name: Date of Service: [name] [name], [name] [name] 02/13/2020 2:45 PM Medical Record Number: [id number] Patient Account Number: [id number] Date of Birth/Sex: Treating RN: [birth year] (74 y.o. [name] Primary Care Lilo Wallington: [name] Other Clinician: Referring Gina Costilla: Treating Rada Zegers/Extender: [name] in Treatment: 0 Compression Therapy Performed for Wound Assessment: Wound #44 Right,Lateral Lower Leg Performed By: Clinician Carlene Coria, RN Compression Type: Four Layer Post Procedure Diagnosis Same as Pre-procedure Electronic Signature(s) Signed: 02/14/2020 5:51:14 PM By: Carlene Coria RN Entered By: Carlene Coria on 02/13/2020 17:21:01 -------------------------------------------------------------------------------- Compression Therapy Details Patient Name: Date of Service: [name] [name], [name] [name] 02/13/2020 2:45 PM Medical Record Number: [id number] Patient Account Number: [id number] Date of Birth/Sex: Treating RN: June 04, 1945 (74 y.o. [name] Primary Care Leanora Murin: [name] Other Clinician: Referring Jady Braggs: Treating Loda Bialas/Extender: [name] in Treatment: 0 Compression Therapy Performed for Wound Assessment: Wound #45 Left,Distal,Lateral Lower Leg Performed By: Clinician Carlene Coria, RN Compression Type: Four Layer Post Procedure Diagnosis Same as  Pre-procedure Electronic Signature(s) Signed: 02/14/2020 5:51:14 PM By: Carlene Coria RN Entered By: Carlene Coria on 02/13/2020 17:21:01 -------------------------------------------------------------------------------- Compression Therapy Details Patient Name: Date of Service: [name] [name], [name] [name] 02/13/2020 2:45 PM Medical Record Number: [id number] Patient Account Number: [id number] Date of Birth/Sex: Treating RN: June 21, 1945 (74 y.o. [name] Primary Care Chibuikem Thang: [name] Other Clinician: Referring Daylon Lafavor: Treating Trashawn Oquendo/Extender: [name] in Treatment: 0 Compression Therapy Performed for Wound Assessment: Wound #46 Right,Distal,Lateral Lower Leg Performed By: Clinician Carlene Coria, RN Compression Type: Four Layer Post Procedure Diagnosis Same as Pre-procedure Electronic Signature(s) Signed: 02/14/2020 5:51:14 PM By: Carlene Coria RN Entered By: Carlene Coria on 02/13/2020 17:21:01 -------------------------------------------------------------------------------- Encounter Discharge Information Details Patient Name: Date of Service: [name] [name], [name] [name] 02/13/2020 2:45 PM Medical Record Number: [id number] Patient Account Number: [id number] Date of Birth/Sex: Treating RN: 01-03-1946 (75 y.o. Debby Bud Primary Care Addilynn Mowrer: [name] Other Clinician: Referring Raley Novicki: Treating Shanta Hartner/Extender: [name] in Treatment: 0 Encounter Discharge  Information Items Discharge Condition: Stable Ambulatory Status: Walker Discharge Destination: Home Transportation: Private Auto Accompanied By: self Schedule Follow-up Appointment: Yes Clinical Summary of Care: Electronic Signature(s) Signed: 02/13/2020 5:43:21 PM By: Deon Pilling Entered By: Deon Pilling on 02/13/2020 17:42:39 -------------------------------------------------------------------------------- Lower Extremity  Assessment Details Patient Name: Date of Service: [name] [name], [name] [name] 02/13/2020 2:45 PM Medical Record Number: [id number] Patient Account Number: [id number] Date of Birth/Sex: Treating RN: 02/13/1946 (74 y.o. Elam Dutch Primary Care Illyana Schorsch: [name] Other Clinician: Referring Antoine Vandermeulen: Treating Evoleht Hovatter/Extender: [name] in Treatment: 0 Edema Assessment Assessed: [Left: No] [Right: No] Edema: [Left: Ye] [Right: s] Calf Left: Right: Point of Measurement: From Medial Instep 50 cm 46.5 cm Ankle Left: Right: Point of Measurement: From Medial Instep 31 cm 31.5 cm Vascular Assessment Pulses: Dorsalis Pedis Palpable: [Left:Yes] [Right:Yes] Electronic Signature(s) Signed: 02/14/2020 5:57:36 PM By: Baruch Gouty RN, BSN Entered By: Baruch Gouty on 02/13/2020 15:38:11 -------------------------------------------------------------------------------- Multi Wound Chart Details Patient Name: Date of Service: [name] [name], [name] [name] 02/13/2020 2:45 PM Medical Record Number: [id number] Patient Account Number: [id number] Date of Birth/Sex: Treating RN: 08-27-1945 (74 y.o. [name] Primary Care Shulem Mader: [name] Other Clinician: Referring Shamere Campas: Treating Wellington Winegarden/Extender: [name] in Treatment: 0 Vital Signs Height(in): 52 Pulse(bpm): 65 Weight(lbs): 260 Blood Pressure(mmHg): 112/74 Body Mass Index(BMI): 36 Temperature(F): 98.4 Respiratory Rate(breaths/min): 18 Photos: [42:No Photos Left, Anterior Lower Leg] [43:No Photos Left, Lateral Lower Leg] [44:No Photos Right, Lateral Lower Leg] Wound Location: [42:Gradually Appeared] [43:Gradually Appeared] [44:Gradually Appeared] Wounding Event: [42:Lymphedema] [43:Lymphedema] [44:Lymphedema] Primary Etiology: [42:Diabetic Wound/Ulcer of the Lower] [43:Diabetic Wound/Ulcer of the Lower] [44:Diabetic Wound/Ulcer of the  Lower] Secondary Etiology: [42:Extremity Cataracts, Lymphedema, Arrhythmia, Cataracts, Lymphedema, Arrhythmia, Cataracts, Lymphedema, Arrhythmia,] [43:Extremity] [44:Extremity] Comorbid History: [42:Congestive Heart Failure, Hypertension, Peripheral Venous Disease, Type II Diabetes, Osteoarthritis, Neuropathy 12/06/2019] [43:Congestive Heart Failure, Hypertension, Peripheral Venous Disease, Type II Diabetes, Osteoarthritis,  Neuropathy 12/06/2019] [44:Congestive Heart Failure, Hypertension, Peripheral Venous Disease, Type II Diabetes, Osteoarthritis, Neuropathy 12/06/2019] Date Acquired: [42:0] [43:0] [44:0] Weeks of Treatment: [42:Open] [43:Open] [44:Open] Wound Status: [42:No] [43:No] [44:Yes] Clustered Wound: [42:N/[name]] [43:N/[name]] [44:3] Clustered Quantity: [42:2.5x3.8x0.1] [43:2x4.5x0.1] [44:2.8x5x0.1] Measurements L x W x D (cm) [42:7.461] [43:7.069] [44:10.996] [name] (cm) : rea [42:0.746] [43:0.707] [44:1.1] Volume (cm) : [42:-5.50%] [43:N/[name]] [44:N/[name]] % Reduction in Area: [42:-5.50%] [43:N/[name]] [44:N/[name]] % Reduction in Volume: [42:Full Thickness Without Exposed] [43:Full Thickness Without Exposed] [44:Full Thickness Without Exposed] Classification: [42:Support Structures Medium] [43:Support Structures Medium] [44:Support Structures Medium] Exudate Amount: [42:Serosanguineous] [43:Serosanguineous] [44:Serosanguineous] Exudate Type: [42:red, brown] [43:red, brown] [44:red, brown] Exudate Color: [42:Flat and Intact] [43:Flat and Intact] [44:Flat and Intact] Wound Margin: [42:Large (67-100%)] [43:Small (1-33%)] [44:Large (67-100%)] Granulation Amount: [42:Red, Pink] [43:Pink] [44:Red] Granulation Quality: [42:Small (1-33%)] [43:Large (67-100%)] [44:None Present (0%)] Necrotic Amount: [42:Fat Layer (Subcutaneous Tissue): Yes Fat Layer (Subcutaneous Tissue): Yes Fat Layer (Subcutaneous Tissue): Yes] Exposed Structures: [42:Fascia: No Tendon: No Muscle: No Joint: No Bone: No Small (1-33%)] [43:Fascia: No  Tendon: No Muscle: No Joint: No Bone: No None] [44:Fascia: No Tendon: No Muscle: No Joint: No Bone: No Small (1-33%)] Wound Number: [id number] 46 N/[name] Photos: No Photos No Photos N/[name] Left, Distal, Lateral Lower Leg Right, Distal, Lateral Lower Leg N/[name] Wound Location: Gradually Appeared Gradually Appeared N/[name] Wounding Event: Lymphedema Lymphedema N/[name] Primary Etiology: Diabetic Wound/Ulcer of the Lower Diabetic Wound/Ulcer of the Lower N/[name] Secondary Etiology: Extremity Extremity Cataracts, Lymphedema, Arrhythmia, Cataracts, Lymphedema, Arrhythmia, N/[name] Comorbid History: Congestive Heart Failure, Congestive Heart Failure, Hypertension, Peripheral Venous Hypertension, Peripheral Venous Disease, Type  II Diabetes, Disease, Type II Diabetes, Osteoarthritis, Neuropathy Osteoarthritis, Neuropathy 12/06/2019 12/06/2019 N/[name] Date Acquired: 0 0 N/[name] Weeks of Treatment: Open Open N/[name] Wound Status: No No N/[name] Clustered Wound: N/[name] N/[name] N/[name] Clustered Quantity: 4.2x4.8x0.1 1.1x0.7x0.1 N/[name] Measurements L x W x D (cm) 15.834 0.605 N/[name] [name] (cm) : rea 1.583 0.06 N/[name] Volume (cm) : N/[name] N/[name] N/[name] % Reduction in Area: N/[name] N/[name] N/[name] % Reduction in Volume: Full Thickness Without Exposed Full Thickness Without Exposed N/[name] Classification: Support Structures Support Structures Medium Small N/[name] Exudate Amount: Serosanguineous Serosanguineous N/[name] Exudate Type: red, brown red, brown N/[name] Exudate Color: Flat and Intact Flat and Intact N/[name] Wound Margin: Small (1-33%) Small (1-33%) N/[name] Granulation Amount: Pink Pink N/[name] Granulation Quality: Large (67-100%) Large (67-100%) N/[name] Necrotic Amount: Fat Layer (Subcutaneous Tissue): Yes Fat Layer (Subcutaneous Tissue): Yes N/[name] Exposed Structures: Fascia: No Fascia: No Tendon: No Tendon: No Muscle: No Muscle: No Joint: No Joint: No Bone: No Bone: No Small (1-33%) Small (1-33%) N/[name] Epithelialization: Treatment Notes Electronic Signature(s) Signed: 02/14/2020  9:36:19 AM By: Linton Ham MD Signed: 02/14/2020 5:51:14 PM By: Carlene Coria RN Entered By: Linton Ham on 02/13/2020 17:18:33 -------------------------------------------------------------------------------- Multi-Disciplinary Care Plan Details Patient Name: Date of Service: RYAH, CRIBB 02/13/2020 2:45 PM Medical Record Number: [id number] Patient Account Number: [id number] Date of Birth/Sex: Treating RN: [birth year] (74 y.o. [name] Primary Care Cecely Rengel: [name] Other Clinician: Referring Haleem Hanner: Treating Abiola Behring/Extender: [name] in Treatment: 0 Active Inactive Wound/Skin Impairment Nursing Diagnoses: Knowledge deficit related to ulceration/compromised skin integrity Goals: Ulcer/skin breakdown will have [name] volume reduction of 30% by week 4 Date Initiated: 02/13/2020 Target Resolution Date: 03/14/2020 Goal Status: Active Ulcer/skin breakdown will have [name] volume reduction of 50% by week 8 Date Initiated: 02/13/2020 Target Resolution Date: 03/14/2020 Goal Status: Active Interventions: Assess patient/caregiver ability to obtain necessary supplies Assess patient/caregiver ability to perform ulcer/skin care regimen upon admission and as needed Assess ulceration(s) every visit Notes: Electronic Signature(s) Signed: 02/14/2020 5:51:14 PM By: Carlene Coria RN Entered By: Carlene Coria on 02/13/2020 16:31:31 -------------------------------------------------------------------------------- Pain Assessment Details Patient Name: Date of Service: GENASIS, ZINGALE 02/13/2020 2:45 PM Medical Record Number: [id number] Patient Account Number: [id number] Date of Birth/Sex: Treating RN: [birth year] (74 y.o. Elam Dutch Primary Care Lenon Kuennen: [name] Other Clinician: Referring Jenafer Winterton: Treating Ziare Orrick/Extender: [name] in Treatment: 0 Active Problems Location of Pain  Severity and Description of Pain Patient Has Paino No Site Locations Rate the pain. Current Pain Level: 0 Character of Pain Describe the Pain: Tender Pain Management and Medication Current Pain Management: Electronic Signature(s) Signed: 02/14/2020 5:57:36 PM By: Baruch Gouty RN, BSN Entered By: Baruch Gouty on 02/13/2020 15:55:42 -------------------------------------------------------------------------------- Patient/Caregiver Education Details Patient Name: Date of Service: AALEIGHA, BOZZA 11/9/2021andnbsp2:45 PM Medical Record Number: [id number] Patient Account Number: [id number] Date of Birth/Gender: Treating RN: [birth year] (74 y.o. [name] Primary Care Physician: [name] Other Clinician: Referring Physician: Treating Physician/Extender: [name] in Treatment: 0 Education Assessment Education Provided To: Patient Education Topics Provided Wound/Skin Impairment: Methods: Explain/Verbal Responses: State content correctly Electronic Signature(s) Signed: 02/14/2020 5:51:14 PM By: Carlene Coria RN Entered By: Carlene Coria on 02/13/2020 16:31:43 -------------------------------------------------------------------------------- Wound Assessment Details Patient Name: Date of Service: BROOKELYNNE, DIMPERIO 02/13/2020 2:45 PM Medical Record Number: [id number] Patient Account Number: [id number] Date of Birth/Sex: Treating RN: 02/10/1946 (74 y.o. Elam Dutch Primary Care Porcia Morganti: [name] Other Clinician: Referring Isaly Fasching: Treating Nial Hawe/Extender: Linton Ham  [name] Weeks in Treatment: 0 Wound Status Wound Number: [id number] Primary Lymphedema Etiology: Wound Location: Left, Anterior Lower Leg Secondary Diabetic Wound/Ulcer of the Lower Extremity Wounding Event: Gradually Appeared Etiology: Date Acquired: 12/06/2019 Wound Open Weeks Of Treatment: 0 Status: Clustered Wound:  No Comorbid Cataracts, Lymphedema, Arrhythmia, Congestive Heart Failure, History: Hypertension, Peripheral Venous Disease, Type II Diabetes, Osteoarthritis, Neuropathy Photos Photo Uploaded By: Mikeal Hawthorne on 02/14/2020 10:21:06 Wound Measurements Length: (cm) 2.5 Width: (cm) 3.8 Depth: (cm) 0.1 Area: (cm) 7.461 Volume: (cm) 0.746 % Reduction in Area: -5.5% % Reduction in Volume: -5.5% Epithelialization: Small (1-33%) Tunneling: No Undermining: No Wound Description Classification: Full Thickness Without Exposed Support Structures Wound Margin: Flat and Intact Exudate Amount: Medium Exudate Type: Serosanguineous Exudate Color: red, brown Foul Odor After Cleansing: No Slough/Fibrino Yes Wound Bed Granulation Amount: Large (67-100%) Exposed Structure Granulation Quality: Red, Pink Fascia Exposed: No Necrotic Amount: Small (1-33%) Fat Layer (Subcutaneous Tissue) Exposed: Yes Necrotic Quality: Adherent Slough Tendon Exposed: No Muscle Exposed: No Joint Exposed: No Bone Exposed: No Treatment Notes Wound #42 (Left, Anterior Lower Leg) 1. Cleanse With Wound Cleanser Soap and water 2. Periwound Care Moisturizing lotion TCA Cream 3. Primary Dressing Applied Calcium Alginate Ag 4. Secondary Dressing ABD Pad Dry Gauze 6. Support Layer Applied 4 layer compression wrap Notes stockinette. explained the orders, dressings, frequency of change, and when to return to wound center. Electronic Signature(s) Signed: 02/14/2020 5:57:36 PM By: Baruch Gouty RN, BSN Entered By: Baruch Gouty on 02/13/2020 15:49:25 -------------------------------------------------------------------------------- Wound Assessment Details Patient Name: Date of Service: Perrone, [name] [name] 02/13/2020 2:45 PM Medical Record Number: [id number] Patient Account Number: [id number] Date of Birth/Sex: Treating RN: Sep 15, 1945 (74 y.o. Elam Dutch Primary Care Cristabel Bicknell: [name] Other  Clinician: Referring Manali Mcelmurry: Treating Otto Felkins/Extender: [name] in Treatment: 0 Wound Status Wound Number: 43 Primary Lymphedema Etiology: Wound Location: Left, Lateral Lower Leg Secondary Diabetic Wound/Ulcer of the Lower Extremity Wounding Event: Gradually Appeared Etiology: Date Acquired: 12/06/2019 Wound Open Weeks Of Treatment: 0 Status: Clustered Wound: No Comorbid Cataracts, Lymphedema, Arrhythmia, Congestive Heart Failure, History: Hypertension, Peripheral Venous Disease, Type II Diabetes, Osteoarthritis, Neuropathy Photos Photo Uploaded By: Mikeal Hawthorne on 02/14/2020 10:21:07 Wound Measurements Length: (cm) 2 Width: (cm) 4.5 Depth: (cm) 0.1 Area: (cm) 7.069 Volume: (cm) 0.707 Wound Description Classification: Full Thickness Without Exposed Support Structures Wound Margin: Flat and Intact Exudate Amount: Medium Exudate Type: Serosanguineous Exudate Color: red, brown Foul Odor After Cleansing: N Slough/Fibrino Y % Reduction in Area: % Reduction in Volume: Epithelialization: None Tunneling: No Undermining: No o es Wound Bed Granulation Amount: Small (1-33%) Exposed Structure Granulation Quality: Pink Fascia Exposed: No Necrotic Amount: Large (67-100%) Fat Layer (Subcutaneous Tissue) Exposed: Yes Necrotic Quality: Adherent Slough Tendon Exposed: No Muscle Exposed: No Joint Exposed: No Bone Exposed: No Treatment Notes Wound #43 (Left, Lateral Lower Leg) 1. Cleanse With Wound Cleanser Soap and water 2. Periwound Care Moisturizing lotion TCA Cream 3. Primary Dressing Applied Calcium Alginate Ag 4. Secondary Dressing ABD Pad Dry Gauze 6. Support Layer Applied 4 layer compression wrap Notes stockinette. explained the orders, dressings, frequency of change, and when to return to wound center. Electronic Signature(s) Signed: 02/14/2020 5:57:36 PM By: Baruch Gouty RN, BSN Entered By: Baruch Gouty on  02/13/2020 15:50:45 -------------------------------------------------------------------------------- Wound Assessment Details Patient Name: Date of Service: Legrand, [name] [name] 02/13/2020 2:45 PM Medical Record Number: [id number] Patient Account Number: [id number] Date of Birth/Sex: Treating RN: 1945-05-26 (74 y.o. Elam Dutch Primary Care Makita Blow: [name],  Elizabeth Other Clinician: Referring Aesha Agrawal: Treating Davien Malone/Extender: [name] in Treatment: 0 Wound Status Wound Number: 44 Primary Lymphedema Etiology: Wound Location: Right, Lateral Lower Leg Secondary Diabetic Wound/Ulcer of the Lower Extremity Wounding Event: Gradually Appeared Etiology: Date Acquired: 12/06/2019 Wound Open Weeks Of Treatment: 0 Status: Clustered Wound: Yes Comorbid Cataracts, Lymphedema, Arrhythmia, Congestive Heart Failure, History: Hypertension, Peripheral Venous Disease, Type II Diabetes, Osteoarthritis, Neuropathy Photos Photo Uploaded By: Mikeal Hawthorne on 02/14/2020 10:24:35 Wound Measurements Length: (cm) 2.8 Width: (cm) 5 Depth: (cm) 0.1 Clustered Quantity: 3 Area: (cm) 10.996 Volume: (cm) 1.1 % Reduction in Area: % Reduction in Volume: Epithelialization: Small (1-33%) Tunneling: No Undermining: No Wound Description Classification: Full Thickness Without Exposed Support Structures Wound Margin: Flat and Intact Exudate Amount: Medium Exudate Type: Serosanguineous Exudate Color: red, brown Foul Odor After Cleansing: No Slough/Fibrino No Wound Bed Granulation Amount: Large (67-100%) Exposed Structure Granulation Quality: Red Fascia Exposed: No Necrotic Amount: None Present (0%) Fat Layer (Subcutaneous Tissue) Exposed: Yes Tendon Exposed: No Muscle Exposed: No Joint Exposed: No Bone Exposed: No Treatment Notes Wound #44 (Right, Lateral Lower Leg) 1. Cleanse With Wound Cleanser Soap and water 2. Periwound Care Moisturizing  lotion TCA Cream 3. Primary Dressing Applied Calcium Alginate Ag 4. Secondary Dressing ABD Pad Dry Gauze 6. Support Layer Applied 4 layer compression wrap Notes stockinette. explained the orders, dressings, frequency of change, and when to return to wound center. Electronic Signature(s) Signed: 02/14/2020 5:57:36 PM By: Baruch Gouty RN, BSN Entered By: Baruch Gouty on 02/13/2020 15:52:00 -------------------------------------------------------------------------------- Wound Assessment Details Patient Name: Date of Service: Quraishi, [name] [name] 02/13/2020 2:45 PM Medical Record Number: [id number] Patient Account Number: [id number] Date of Birth/Sex: Treating RN: April 06, 1946 (74 y.o. 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 Event: Gradually Appeared Etiology: Date Acquired: 12/06/2019 Wound Open Weeks Of Treatment: 0 Status: Clustered Wound: No Comorbid Cataracts, Lymphedema, Arrhythmia, Congestive Heart Failure, History: Hypertension, Peripheral Venous Disease, Type II Diabetes, Osteoarthritis, Neuropathy Photos Photo Uploaded By: Mikeal Hawthorne on 02/14/2020 10:24:38 Wound Measurements Length: (cm) 4.2 Width: (cm) 4.8 Depth: (cm) 0.1 Area: (cm) 15.834 Volume: (cm) 1.583 % Reduction in Area: % Reduction in Volume: Epithelialization: Small (1-33%) Tunneling: No Undermining: No Wound Description Classification: Full Thickness Without Exposed Support Structu Wound Margin: Flat and Intact Exudate Amount: Medium Exudate Type: Serosanguineous Exudate Color: red, brown res Foul Odor After Cleansing: No Slough/Fibrino Yes Wound Bed Granulation Amount:  Small (1-33%) Exposed Structure Granulation Quality: Pink Fascia Exposed: No Necrotic Amount: Large (67-100%) Fat Layer (Subcutaneous Tissue) Exposed: Yes Necrotic Quality: Adherent Slough Tendon Exposed: No Muscle Exposed: No Joint Exposed: No Bone Exposed: No Treatment Notes Wound #45 (Left, Distal, Lateral Lower Leg) 1. Cleanse With Wound Cleanser Soap and water 2. Periwound Care Moisturizing lotion TCA Cream 3. Primary Dressing Applied Calcium Alginate Ag 4. Secondary Dressing ABD Pad Dry Gauze 6. Support Layer Applied 4 layer compression wrap Notes stockinette. explained the orders, dressings, frequency of change, and when to return to wound center. Electronic Signature(s) Signed: 02/14/2020 5:57:36 PM By: Baruch Gouty RN, BSN Entered By: Baruch Gouty on 02/13/2020 15:53:17 -------------------------------------------------------------------------------- Wound Assessment Details Patient Name: Date of Service: Henrichs, [name] [name] 02/13/2020 2:45 PM Medical Record Number: [id number] Patient Account Number: [id number] Date of Birth/Sex: Treating RN: 28-Nov-1945 (74 y.o.  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 Event: Gradually Appeared Etiology: Date Acquired: 12/06/2019 Wound Open Weeks Of Treatment: 0 Status: Clustered Wound: No Comorbid Cataracts, Lymphedema, Arrhythmia, Congestive Heart Failure, History: Hypertension, Peripheral Venous Disease, Type II Diabetes, Osteoarthritis, Neuropathy Wound Measurements Length: (cm) 1.1 Width: (cm) 0.7 Depth: (cm) 0.1 Area: (cm) 0.605 Volume: (cm) 0.06 % Reduction in Area: % Reduction in  Volume: Epithelialization: Small (1-33%) Tunneling: No Undermining: No Wound Description Classification: Full Thickness Without Exposed Support Structures Wound Margin: Flat and Intact Exudate Amount: Small Exudate Type: Serosanguineous Exudate Color: red, brown Foul Odor After Cleansing: No Slough/Fibrino Yes Wound Bed Granulation Amount: Small (1-33%) Exposed Structure Granulation Quality: Pink Fascia Exposed: No Necrotic Amount: Large (67-100%) Fat Layer (Subcutaneous Tissue) Exposed: Yes Necrotic Quality: Adherent Slough Tendon Exposed: No Muscle Exposed: No Joint Exposed: No Bone Exposed: No Treatment Notes Wound #46 (Right, Distal, Lateral Lower Leg) 1. Cleanse With Wound Cleanser Soap and water 2. Periwound Care Moisturizing lotion TCA Cream 3. Primary Dressing Applied Calcium Alginate Ag 4. Secondary Dressing ABD Pad Dry Gauze 6. Support Layer Applied 4 layer compression wrap Notes stockinette. explained the orders, dressings, frequency of change, and when to return to wound center. Electronic Signature(s) Signed: 02/14/2020 5:57:36 PM By: Baruch Gouty RN, BSN Entered By: Baruch Gouty on 02/13/2020 15:55:27 -------------------------------------------------------------------------------- Vitals Details Patient Name: Date of Service: Bayron, [name] [name] 02/13/2020 2:45 PM Medical Record Number: [id number] Patient Account Number: [id number] Date of Birth/Sex: Treating RN: Feb 04, 1946 (74 y.o. Elam Dutch Primary Care Pola Furno: [name] Other Clinician: Referring Sadiel Mota: Treating Sacoya Mcgourty/Extender: [name] in Treatment: 0 Vital Signs Time Taken: 15:00 Temperature (F): 98.4 Height (in): 71 Pulse (bpm): 85 Source: Stated Respiratory Rate (breaths/min): 18 Weight (lbs): 260 Blood Pressure (mmHg): 112/74 Source: Stated Reference Range: 80 - 120 mg / dl Body Mass Index (BMI): 36.3 Electronic  Signature(s) Signed: 02/14/2020 5:57:36 PM By: Baruch Gouty RN, BSN Entered By: Baruch Gouty on 02/13/2020 15:28:31

## 2020-02-14 NOTE — Progress Notes (Signed)
JALEIYAH, ALAS ([id number]) Visit Report for 02/13/2020 HPI Details Patient Name: Date of Service: MAANSI, WIKE 02/13/2020 2:45 PM Medical Record Number: [id number] Patient Account Number: [id number] Date of Birth/Sex: Treating RN: [birth year] (74 y.o. [name] Primary Care Provider: [name] Other Clinician: Referring Provider: Treating Provider/Extender: [name] in Treatment: 0 History of Present Illness HPI Description: 04/09/15; the patient is here for review of wounds on her bilateral lower extremities for the last month. The patient has a history of lymphedema and bilateral chronic venous insufficiency with inflammation she was here for review of wounds on her bilateral legs in 2014 she was discharged home with external compression pumps which she is currently not using. Over the last month she developed wounds on her bilateral lower legs with drainage and pain and she is here for our review of this. 04/16/15; the patient's edema is under much better control. She has 3 areas one on the right anterior medial leg one on the left posterior leg and a small open area with purulent drainage on the left anterior leg. I have cultured the area on the left anterior leg 04/23/15; continued improvement in the patient's edema. All her wounds of closed I see no open areas. Culture from the left anterior leg last time was negative. READMISSION 02/07/16; this patient is a woman that I saw her earlier in 2017. I believe we discharged her very quickly with bilateral wounds in her legs secondary to chronic venous hypertension with inflammation and secondary lymphedema. She had juxtalite stockings. She has external compression pumps at home from a stay in our clinic in 2014. She tells me that she's had wounds on her bilateral lower legs since March or April of this year. 3 wounds on the left 2 on the right. She is a type II diabetic and has a history of  noncompressible vessels bilaterally although she is tolerated 4-layer compression. The last note from her primary physician Dr. [name] was in May. At that point Dr. [name] had referred the patient here for lymphedema management. The patient stated she made a phone call to year but was not able to arrange an appointment. It is not clear that she is actually using anything on the wounds currently except some gauze that was saturated with drainage per our intake nurse. She is certainly not using her juxtalite stockings and by the patient's own admission she is not using her compression pumps because they are "irritating". I don't see a recent hemoglobin A1c. The patient states she has not been systemically unwell but the legs are painful 02/14/16; patient tolerated her Profore wraps reasonably well. She is using the compression pumps 1 time per day. Measurements of her legs are better in terms of the amount of lymphedema 02/21/16; patient tolerated her Profore wraps it. She says she is using her compression pumps one or 2 times a day. Measurements of her leg circumference are improved and most of her wound dimensions are better, unfortunately the home health that we referred her to did not except her insurance but works successful in getting her accompanied that would except her insurance however she apparently has a $25 co-pay which she cannot afford as she is on a fixed income 03/05/16; patient states she did not back using the pumps. She is healed that 2 wounds on the left leg. She still has areas on the right lateral lower leg, right medial lower leg and left lateral lower leg. All of these look  some better and have reduced in area 03/13/16; still 3 wounds 1 on the left lateral leg, one on the left medial/posterior leg which is really the most extensive and a small area on the right lateral leg. All of this in the setting of severe chronic lymphedema and chronic venous inflammation.  Damage to the skin with cobblestone thickened skin. 03/20/16; still 2 areas. The area on the left lateral leg appears to of closed over. The right medial leg is still most extensive wound although it appears to be closing over right lateral only a small open area remains. Using silver alginate Profore wraps 03/27/16; the area on the left leg remains closed. The edema control is better on the left than the right. The right medial leg is still weeping but everything appears to be epithelialized. The lateral aspect of the right leg appears closed. She tells me she has old juxtalite stockings. She has been compliant with her compression pumps. 04/03/16; the area on the left leg remains closed. She has ordered new juxtalite stockings. The right posterior medial wound is fully epithelialized. However she has an irritated area over the right Achilles that she states is been itching all week under the wraps.. She has been compliant with her compression pumps 04/10/16; the area on the left leg remains closed. The right posterior medial wound is also closed today. The irritated area over the right Achilles which was probably wrap injury is also fully epithelialized and closed. The patient has not received her new juxtalite stockings, she arrives in clinic today with an old juxtalite stocking in place. Sheis using her compression pumps secondary to her severe stage III lymphedema READMISSION 01/21/17; this is a patient we know from several previous stays in this clinic. She has lymphedema with chronic venous insufficiency and inflammation. The last time she was here we discharged her with bilateral juxta lites which she is compliant with. She also has external compression pumps at home from stays in our clinic in 2014 although she tells me that she is not very compliant with these as when she uses them a causes pain in her bilateral knees the patient states that her wounds on her left greater than right leg  including 2 large areas on the left anterior and left lateral leg and an area on the right leg open in late June or July since then she is been followed by [facility] and she's had bilateral Unna boot supplied every week for the last month.. I think the wounds have actually improved although they are not specifically dressed. She states she had an ultrasound to rule out DVT bilaterally that was negative. She does not have arterial studies although she is a diabetic. She states she has a lot of pain in her legs she states she does not use her external compression pumps because it causes pain in her knees. As usual her ABIs were noncompressible bilaterally in this clinic 01/28/17; currently the patient only has one small wound on the left lateral leg and I think this should be healed next week. We are going to try to get her bilateral extremitease stockings. She tells me that compression pumps heard her posterior rib arthritic knees so she is not been compliant with this 02/04/17; the patient's wounds are totally healed. She has chronic venous insufficiency and secondary lymphedema. She has new extremitease stockings. She also has compression pumps Readmission: 04/21/17 on evaluation today patient appears to be doing about the same as what she was previous  when we were evaluating her back in November 2018. At that point she had completely healed. With that being said she has now reopened and states that she wasn't aware that she was supposed to come back. 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 Subsequently she also states that although she has the compression that we got for her previously the extremitease stockings she nonetheless doesn't always wear these and being noncompliant often has things will reopen as far as ulcers on her lower extremities. No fevers, chills, nausea, or vomiting  noted at this time. Patient has no evidence of dementia. She is having some discomfort in regard to the ulcers at this point. 04/28/17 on evaluation today patient appears to be doing significantly better in regard to her wounds. With that being said she is still having significant discomfort and is not really keen on sharp debridement. She does not appear to have any evidence of infection which is good news I do think the Iodoflex is beneficial for her. Of note her ABI's were found in the system to be on the right 1.21 with a TBI of 0.76 and on the left 1.35 with a TBI of 0.75. In general her swelling appears to be significantly improved however. No fevers, chills, nausea, or vomiting noted at this time. Patient has been tolerating the wraps without complication. She has no evidence of dementia. 05/05/17-she is here in follow-up evaluation for bilateral lower extremity venous and lymphedema ulcers. She states she has not been using her lymphedema pumps secondary to pain at her knees. After discussion it was noted that she uses knee-high lymphedema pumps but does have a pair of thigh-high lymphedema pumps. She was strongly advised to use the thigh-high lymphedema pumps and if necessary reduce the setting for improved tolerance. She reports no change in odor and/or drainage. She continues to tolerate sharp debridement poorly with significant pain, primarily to the right lower extremity. We will continue with current treatment plan, along with improved lymphedema pump use and follow-up next week 05/12/17 on evaluation today patient actually appears to be doing much better in regard to her bilateral lower extremity ulcers. Only the right altar actually requires debridement today. The other two cleaned off nicely in two of the three in fact on the left lower extremity or actually almost completely healed. She does have some weeping noted still the bilateral lower extremities left greater than right. She still  is not able to use her compression pumps even though I have mentioned this several times to her as did Leah last week. No fevers, chills, nausea, or vomiting noted at this time. Patient has been tolerating the dressing changes without complication. Patient has no evidence of dementia 05/19/17 on evaluation today patient appears to be doing very well regard to her bilateral lower extremity ulcers. In fact she seems to be improving and in fact healed in a couple of locations that were giving her trouble last week. Overall I'm extremely pleased with how things have progressed. She still has some discomfort especially in the right lateral lower extremity. Fortunately there does not appear to be any evidence of significant infection which is good news she does tell me that she has used her compression pumps several times although not routinely over the past week I did praise her for this. I do think it will be beneficial and that might even be what's helping with  her wounds as far as healing is concerned at least to a degree. No fevers, chills, nausea, or vomiting noted at this time. Patient has no dementia and no evidence of infection. 05/26/17 on evaluation today patient presents for evaluation concerning her bilateral lower extremity ulcer secondary to lymphedema. Fortunately she appears to be doing very well at this point in time. Specifically her right leg appears to be completely closed and healed. The left leg though there is still a small area of opening overall has healed. She does have a little bit of excoriation at the top or the wraps are because they slid down on her. Otherwise there's no evidence of infection and the other has improved dramatically now that she is not draining as significantly. Patient is having no significant pain she tells me she has been able to use her lymphedema pumps one time a day although that's not all she can tolerate. Even that is difficult for her. 06/02/17 on  evaluation today patient's ulcers appear to be completely closed at this point. She does still note some odor but I believe this is due to the fact she has not been able to wash her legs as well currently. Fortunately she did bring her compression with her today. She does have the EXTREMIT-EASE Compression. With that being said she is somewhat nervous about going back to this as previously she broke out yet again once we discontinue wrapping her. READMISSION 11/23/17; patient is now 74 year old diabetic woman with severe chronic venous insufficiency with lymphedema. We care for her last in this clinic in February 2019 at which time she had chronic venous insufficiency with lymphedema bilateral lower extremity ulcers. We discharged her with bilateral wrap around/extremitease stockings and compression pumps. Is fairly clear she is not using the compression pumps stating it causes pain in her knees. In any case she was admitted to hospital from 8/3 through 8/6 with a several week history of recurrent bilateral ulcers. She was felt to have bilateral cellulitis treated with doxycycline IV. She is still on oral doxycycline and has another 2 days worth. At discharge her white count was 14.1 hemoglobin at 8.9. The patient is a diabetic however her last ABIs in 2018 showed a right ABI of 1.21, left of 1.35. TBI's were 0.76 on the right 0.75 on the left. Posterior tibial artery was monophasic on the right triphasic on the left. Dorsalis pedis triphasic on the right and triphasic on the left. She is not felt to have significant macrovascular disease 12/02/17;significant bilateral lower extremity wounds secondary to severe uncontrolled lymphedema. She did not get home health out to change the wraps. So she kept this on all week. She is telling me she using his her compression pumps once a week. In general all the wounds look better. We've been using silver alginate with secondary absorptive dressings 4 layer  compression 12/10/17; bilateral lower extremity wounds secondary to uncontrolled lymphedema. She tells me she is using her compression pumps on most days. We've been keeping wraps on all week. Using silver alginate. She has wounds on the right lateral calf left medial and left lateral calf. All of this is somewhat better 12/16/17; the patient has 3 small open areas. One on the right lateral calf, one on the left medial calf and the small area on the left lateral calf. She has lymphedema, chronic venous insufficiency with hemosiderin deposition. Very fibrotic distal lower extremity scan with suggestion of an inverted bottle sign appearance to her legs. She has compression  pumps but she does not use them has not used them in the last week. Currently we have her in 4 layer compression, silver alginate to the wounds and this is being changed once by home health. She is noncompliant with the compression pumps because she says it hurts her knees. She also has compression stockings ando Juxta light stockings at home 12/24/17; the patient's right lateral calf is healed as well as the left medial. There is no open wound on the right leg. She still has 2 areas on the left lateral calf. We transitioned the right leg into an extremitease stocking which she brought in from home. 12/31/17; the patient has a superficial wound on the left lateral lower calf. We've been wrapping this leg. She arrives today with the right leg swollen but without a wound. She cannot get her extremities stocking on the right leg. There is a lot more edema here. She is not using her compression pumps 01/10/2018; patient has a same superficial wound on the left lateral calf. Her right leg has less peripheral edema. She tells me she also started herself on some Lasix that had been previously prescribed for her at some point but she had never taken it. She is also concerned about whether her compression pumps are leaking. She has extremitease  stockings however the edema gets bad enough she can get these on. Currently she has no open wound on the right leg and is mention the edema is actually better than when I saw this 10 days ago 01/17/2018; patient has a same superficial area on the posterior left calf and a weeping area in the mid part of the right lateral calf. She still does not have anyone from [facility] coming to see her compression pumps which she describes as being suspicious for a leakage. I am not really sure how frequently she is using this in any case. She has massive bilateral calf lymphedema. I think there is some spreading lymphedema into her posterior thighs. 01/24/2018; the patient's edema in her legs is actually some better. She still has a smaller area on the left posterior calf although this is better. The weeping area on the right lateral calf is also better. As it turned out she did not get her pumps from [facility] but [facility] and they are going to try to go out and see these this week which is going to help. I explained to her that she will use the compression pumps once a day while we are wrapping her legs but when she transitions to stockings that may be she will require pumping twice a day. Her compliance in this area has not been high and I wonder about her ability to do this herself. 02/01/2018; the area on the posterior left leg is healed. She still has the original wound on the right lateral calf which is epithelialized. The however just above this there is still weeping lymphedema fluid. She has a new area on the upper right calf which is either wrap injury or is she points out where she has been scratching under the wraps. We still not have managed to get a hold of a representative of [facility] to see if the pump is operational. She has extremitease stockings at home however I am not of the opinion that this will maintain her skin integrity without the compression  pumps. 02/08/2018; posterior left leg remains healed although she has a new small open area on the left lateral calf. Right lateral  calf has two quarter sized open areas and close juxtaposition. There is less weeping edema fluid. She apparently managed to contact [facility]. They are sending her a part. But she is still not received it 02/15/2018; the patient has no open wound on either leg. Some scale present on the right upper lateral calf just below the fibular head. Edema control is excellent. She did not bring her stockings or her juxta lite wrap around. She has the part for her compression pump but she has not use the compression pumps yet READMISSION 01/10/2019 Patient is now a 74 year old woman that we have had in the clinic multiple times in the past. She has chronic lower extremity lymphedema, recurrent wounds on her bilateral lower extremities. She is also a type II diabetic reasonably well controlled with a recent hemoglobin A1c while she was in the hospital of 5.9. She has wounds on her bilateral lower extremities mostly on the left lateral and right lateral. These were noted also during her recent hospitalization. The patient states they have been there for a while. She has not been able to wear her stockings he has trouble getting them on as she lives alone. She also has external compression pumps but uses them sparingly because it causes pain in her knees which hurt because of osteoarthritis. The patient was on doxycycline before she went into the hospital apparently for fear of infection in her legs and is on Keflex coming out because of a UTI The patient was recently in the hospital from 01/06/2019 through 01/09/2019. She was admitted with nausea and vomiting and prerenal azotemia. This was corrected with IV fluid her Lasix has been put on hold. Hemoglobin A1c at 5.9 her metformin was discontinued. She was also felt to have a UTI she was prescribed Keflex at 503 times a day she  is picking that up today. The patient is not felt to have an arterial issue. ABIs in our clinic were 1.24 on the right and 1.06 on the left. She had formal studies in 2018 at which time her ABIs were 1.21 on the right TBI of 0.76 and on the left 1.35 and 0.75 respectively. Waveforms are on the right were monophasic and biphasic, triphasic and biphasic on the left. She is tolerated 4 layer compression in the past 10/13. Comes in today having not used her compression pumps /perhaps once in the last week. She has too much edema to consider healing these wounds. We have been using silver alginate 10/20; she tells me he had she used her compression pumps once a day as we asked. Clearly she has edema out of her legs although most of it seems to be settling around her knees which is what she complains about. We have been using silver alginate to the extensive de-epithelialized area on the left lateral calf and a much smaller area on the right lateral extending linearly into the right posterior 10/27; she is using her compression pumps daily. Her edema control is better. We have been using silver alginate to the extensive wound areas on her bilateral lower legs. This includes left anterior left lateral and left posterior. Right anterior right lateral and right posterior. The wounds are not all in the same condition. She has severe bilateral skin damage with the epithelialization, flaking dried epithelium 11/3; she is using her compression pumps 1 times daily. Pretty obvious that her wraps fell down especially on the left leg to mid calf. I think things are improving on the right lateral calf,  I do not see anything open posteriorly. The major areas on the left lateral where she has 3 or 4 deeper wounds in the midst of an entire de-epithelialized area. There is erythema here but no tenderness I think this represents venous stasis rather than cellulitis. I debrided the areas on the left lateral calf last week  but once again they have tightly adherent debris over the top of them which is disappointing 11/10; the patient's major areas on the left lateral calf to 3 wounds with some depth surrounded by a large area of de-epithelialized tissue. There is no infection. We have been using silver alginate under 4-layer compression. On the right lateral she has a more contained wound area that is superficial. Been using silver alginate under 4-layer compression 11/17; the patient's major wound is on the left lateral calf. We have been using silver alginate under compression. There is no open wound per se on the right but it de-epithelialized area on the lateral calf that is weeping edema fluid. I do not see much change here today. 12/1; patient arrives in clinic with her wounds looking better. Her edema control is better. The lymphedema specialist working on the left leg we have been working on the right. We have been using 4-layer compression. The patient is using a consider external compression pumps and Hydrofera Blue to the open areas. She is being measured for an abdominal compression pump and apparently that is going to go through her insurance 12/15; continued improvement. 2 small areas on the left and slightly larger 2 small areas on the right. 12/22; patient still has open areas on the right lateral calf which are superficial and the new one on the right anterior calf. She did not use her compression pumps this week. Her left leg is wrapped by our lymphedema specialist. There are no open wounds here 12/29; she still has a superficial area on the right lateral calf and area on the right medial calf. Right anterior wound area appears to have closed. She has much better edema control today than last week 04/11/2019; the patient is here with severe bilateral lymphedema, chronic venous insufficiency with marked skin changes in her lower extremities. We are wrapping her right leg and her lymphedema specialist is  putting lymphedema wraps on the left. She states she is using her compression pumps although in the past her compliance with uses been limited because of osteoarthritic pain in her knees. She only has one remaining area on the right leg however she has wrap injuries on the left at the upper level what where I think her wraps were cause. the left lateral area left posterior. These would be new this week 1/12; generally better. The wrap injuries in the upper calf on the left from last week are down to a single wound. She has 2 wounds anteriorly that are still weeping fluid although these appear to have been epithelialized surface for the most part. There is no open wounds on the right leg we are wrapping her right leg and 4-layer compression and we are going a lymphedema wrap on the left leg. The the approach is a Farrow 2000 wrap on the left leg and then subsequently the right leg. She claims to be compliant with her compression pumps 1/19; the patient has 1 tiny open area on the right medial calf. The rest of this is not open. She however has a large erythematous denuded area on the left anterior tibia extending laterally. There is no evidence of  infection here. She has her new current waist high compression pumps 1/26; the patient has no open wounds on the right. On the left anteriorly much better than last week still a smattering of small weeping open areas. There is no evidence of infection 2/2; still no open wounds on the right leg. He still does not have a external compression garment. She arrives in clinic today with an open area on the plantar aspect of the left second toe at the base of the PIP. This apparently was painful last week so it was not wrapped by our lymphedema specialist. There was no open wound at the time. The patient denies traumatizing the toe however today she arrives with a small but deep wound in this area the dorsal surface of the toe is very tender. 2/11; she arrives in  clinic today with everything epithelialized. She was that way last week on the right but we have still been wrapping. She has been using lymphedema wraps on the left. On careful inspection of the left leg she still has areas that look vulnerable but no clearly open wound there may be some weeping through cracks in the skin. She is using above the waist external compression pumps and she has a separate external compression garment for her thighs. The plan is to discharge her for follow-up in lymphedema clinic with Juliann Pulse our lymphedema specialist. In the past healing this woman has resulted in relatively quick breakdown however she generally seems more motivated example willing to use her external compression pumps etc. READMISSION 02/13/2020. Mrs. Sanjurjo is a now 74 year old woman who has bilateral lower extremity lymphedema and a history of difficult to treat bilateral lower extremity wounds. When she was here last time we had her seen by a lymphedema specialist. She had lower extremity compression stockings as well as compression garments for her thighs. She has compression pumps as well. Unfortunately she has not been doing well. She describes unrelenting nausea with vomiting anytime she tries to eat or drink or take her medications. She was seen in urgent care on 10/26. Notably they found her to be somewhat dehydrated. Gave her a liter of fluid. Her creatinine was up to 2.42. The rest of her lab work including liver function tests, white count, hemoglobin were all normal. She did have a urine for culture that showed Klebsiella she was prescribed ciprofloxacin but I seriously doubt that she is able to keep this down by her description. She has not taken any of her meds in over a month. She states she feels too weak to take care of her lower extremities with compression garments and her external compression pumps she has not been using any of these. As a result she has 2 large areas on the left lateral  calf one anteriorly a cluster of 3 on the right lateral and a small area on her right ankle. All of these are superficial. She has poorly controlled lymphedema especially on the anterior tibial areas however by my my anxiety she actually looks like she has less swelling than what I remember. Past medical history includes type 2 diabetes with a recent hemoglobin A1c of 6.8 chronic venous insufficiency, lymphedema which she was managing with above the waist compression and compression pumps, diastolic congestive heart failure. Nausea and vomiting as described above. We did not repeat her arterial studies today however these were quite normal when they were last done in 2018 with an ABI of 1.21 on the right and 1.35 on the left. TBI's  were within normal limits at 0.76 and 0.75 she had triphasic waveforms on the left monophasic waveforms at the posterior tibial but triphasic waveforms at the dorsalis pedis. She has previously tolerated 4 layer compression Electronic Signature(s) Signed: 02/14/2020 9:36:19 AM By: Linton Ham MD Entered By: Linton Ham on 02/13/2020 17:25:26 -------------------------------------------------------------------------------- Physical Exam Details Patient Name: Date of Service: Mustard, [name] 02/13/2020 2:45 PM Medical Record Number: [id number] Patient Account Number: [id number] Date of Birth/Sex: Treating RN: Aug 20, 1945 (74 y.o. [name] Primary Care Provider: [name] Other Clinician: Referring Provider: Treating Provider/Extender: [name] in Treatment: 0 Constitutional Sitting or standing Blood Pressure is within target range for patient.. Pulse regular and within target range for patient.Marland Kitchen Respirations regular, non-labored and within target range.. Temperature is normal and within the target range for the patient.Marland Kitchen Appears in no distress however she appears gaunt and looks as though she has lost  weight.. Cardiovascular Heart rhythm and rate regular, without murmur or gallop. I would say she is mildly dehydrated poor skin turgor. Mucous membranes however moist. Fetal pulses are palpable. Edema present in both extremities.. Gastrointestinal (GI) She had no tenderness to deep palpation in the right upper quadrant there were no masses. No liver or spleen enlargement. Genitourinary (GU) No suprapubic or costovertebral angle tenderness. Psychiatric Patient appears depressed today.. Notes Wound exam; the amount of edema in her lower extremities seems less than what I remember in spite of the fact she is not using any of her compression garments or pumps. Nevertheless she has uncontrolled edema. Superficial wounds on the left lateral left anterior and right lateral and several areas that look like they are at risk of breaking down. Electronic Signature(s) Signed: 02/14/2020 9:36:19 AM By: Linton Ham MD Entered By: Linton Ham on 02/13/2020 17:27:47 -------------------------------------------------------------------------------- Physician Orders Details Patient Name: Date of Service: Guzzo, [name] 02/13/2020 2:45 PM Medical Record Number: [id number] Patient Account Number: [id number] Date of Birth/Sex: Treating RN: 1946/02/16 (74 y.o. [name] Primary Care Provider: [name] Other Clinician: Referring Provider: Treating Provider/Extender: [name] in Treatment: 0 Verbal / Phone Orders: No Diagnosis Coding Follow-up Appointments Return Appointment in 1 week. Dressing Change Frequency Do not change entire dressing for one week. Skin Barriers/Peri-Wound Care Moisturizing lotion - LOWER LEGS TCA Cream or Ointment - LOWER LEGS Wound Cleansing May shower with protection. Primary Wound Dressing lginate with Silver - ALL WOUNDS Calcium A Secondary Dressing Dry Gauze - ALL WOUNDS ABD pad - ALL WOUNDS Edema Control 4  layer compression - Bilateral Avoid standing for long periods of time Elevate legs to the level of the heart or above for 30 minutes daily and/or when sitting, a frequency of: Exercise regularly Segmental Compressive Device. - DIALY FOR 1 HOUR Electronic Signature(s) Signed: 02/14/2020 9:36:19 AM By: Linton Ham MD Signed: 02/14/2020 5:51:14 PM By: Carlene Coria RN Entered By: Carlene Coria on 02/13/2020 16:35:54 -------------------------------------------------------------------------------- Problem List Details Patient Name: Date of Service: GABRIANNA, FASSNACHT 02/13/2020 2:45 PM Medical Record Number: [id number] Patient Account Number: [id number] Date of Birth/Sex: Treating RN: January 02, 1946 (74 y.o. [name] Primary Care Provider: [name] Other Clinician: Referring Provider: Treating Provider/Extender: [name] in Treatment: 0 Active Problems ICD-10 Encounter Code Description Active Date MDM Diagnosis [phone] Non-pressure chronic ulcer of other part of left lower leg limited to breakdown 02/13/2020 No Yes of skin L97.811 Non-pressure chronic ulcer of other part of right lower leg limited to breakdown 02/13/2020  No Yes of skin I89.0 Lymphedema, not elsewhere classified 02/13/2020 No Yes E86.0 Dehydration 02/13/2020 No Yes R11.2 Nausea with vomiting, unspecified 02/13/2020 No Yes Inactive Problems Resolved Problems Electronic Signature(s) Signed: 02/14/2020 9:36:19 AM By: Linton Ham MD Entered By: Linton Ham on 02/13/2020 17:18:26 -------------------------------------------------------------------------------- Progress Note Details Patient Name: Date of Service: Stratton, [name] 02/13/2020 2:45 PM Medical Record Number: [id number] Patient Account Number: [id number] Date of Birth/Sex: Treating RN: [birth year] (74 y.o. [name] Primary Care Provider: [name] Other Clinician: Referring  Provider: Treating Provider/Extender: [name] in Treatment: 0 Subjective History of Present Illness (HPI) 04/09/15; the patient is here for review of wounds on her bilateral lower extremities for the last month. The patient has a history of lymphedema and bilateral chronic venous insufficiency with inflammation she was here for review of wounds on her bilateral legs in 2014 she was discharged home with external compression pumps which she is currently not using. Over the last month she developed wounds on her bilateral lower legs with drainage and pain and she is here for our review of this. 04/16/15; the patient's edema is under much better control. She has 3 areas one on the right anterior medial leg one on the left posterior leg and a small open area with purulent drainage on the left anterior leg. I have cultured the area on the left anterior leg 04/23/15; continued improvement in the patient's edema. All her wounds of closed I see no open areas. Culture from the left anterior leg last time was negative. READMISSION 02/07/16; this patient is a woman that I saw her earlier in 2017. I believe we discharged her very quickly with bilateral wounds in her legs secondary to chronic venous hypertension with inflammation and secondary lymphedema. She had juxtalite stockings. She has external compression pumps at home from a stay in our clinic in 2014. She tells me that she's had wounds on her bilateral lower legs since March or April of this year. 3 wounds on the left 2 on the right. She is a type II diabetic and has a history of noncompressible vessels bilaterally although she is tolerated 4-layer compression. The last note from her primary physician Dr. [name] was in May. At that point Dr. [name] had referred the patient here for lymphedema management. The patient stated she made a phone call to year but was not able to arrange an appointment. It is not  clear that she is actually using anything on the wounds currently except some gauze that was saturated with drainage per our intake nurse. She is certainly not using her juxtalite stockings and by the patient's own admission she is not using her compression pumps because they are "irritating". I don't see a recent hemoglobin A1c. The patient states she has not been systemically unwell but the legs are painful 02/14/16; patient tolerated her Profore wraps reasonably well. She is using the compression pumps 1 time per day. Measurements of her legs are better in terms of the amount of lymphedema 02/21/16; patient tolerated her Profore wraps it. She says she is using her compression pumps one or 2 times a day. Measurements of her leg circumference are improved and most of her wound dimensions are better, unfortunately the home health that we referred her to did not except her insurance but works successful in getting her accompanied that would except her insurance however she apparently has a $25 co-pay which she cannot afford as she is on a fixed income 03/05/16;  patient states she did not back using the pumps. She is healed that 2 wounds on the left leg. She still has areas on the right lateral lower leg, right medial lower leg and left lateral lower leg. All of these look some better and have reduced in area 03/13/16; still 3 wounds 1 on the left lateral leg, one on the left medial/posterior leg which is really the most extensive and a small area on the right lateral leg. All of this in the setting of severe chronic lymphedema and chronic venous inflammation. Damage to the skin with cobblestone thickened skin. 03/20/16; still 2 areas. The area on the left lateral leg appears to of closed over. The right medial leg is still most extensive wound although it appears to be closing over right lateral only a small open area remains. Using silver alginate Profore wraps 03/27/16; the area on the left leg  remains closed. The edema control is better on the left than the right. The right medial leg is still weeping but everything appears to be epithelialized. The lateral aspect of the right leg appears closed. She tells me she has old juxtalite stockings. She has been compliant with her compression pumps. 04/03/16; the area on the left leg remains closed. She has ordered new juxtalite stockings. The right posterior medial wound is fully epithelialized. However she has an irritated area over the right Achilles that she states is been itching all week under the wraps.. She has been compliant with her compression pumps 04/10/16; the area on the left leg remains closed. The right posterior medial wound is also closed today. The irritated area over the right Achilles which was probably wrap injury is also fully epithelialized and closed. The patient has not received her new juxtalite stockings, she arrives in clinic today with an old juxtalite stocking in place. Sheis using her compression pumps secondary to her severe stage III lymphedema READMISSION 01/21/17; this is a patient we know from several previous stays in this clinic. She has lymphedema with chronic venous insufficiency and inflammation. The last time she was here we discharged her with bilateral juxta lites which she is compliant with. She also has external compression pumps at home from stays in our clinic in 2014 although she tells me that she is not very compliant with these as when she uses them a causes pain in her bilateral knees the patient states that her wounds on her left greater than right leg including 2 large areas on the left anterior and left lateral leg and an area on the right leg open in late June or July since then she is been followed by [facility] and she's had bilateral Unna boot supplied every week for the last month.. I think the wounds have actually improved although they are not specifically dressed. She states she had  an ultrasound to rule out DVT bilaterally that was negative. She does not have arterial studies although she is a diabetic. She states she has a lot of pain in her legs she states she does not use her external compression pumps because it causes pain in her knees. As usual her ABIs were noncompressible bilaterally in this clinic 01/28/17; currently the patient only has one small wound on the left lateral leg and I think this should be healed next week. We are going to try to get her bilateral extremitease stockings. She tells me that compression pumps heard her posterior rib arthritic knees so she is not been compliant with this 02/04/17;  the patient's wounds are totally healed. She has chronic venous insufficiency and secondary lymphedema. She has new extremitease stockings. She also has compression pumps Readmission: 04/21/17 on evaluation today patient appears to be doing about the same as what she was previous when we were evaluating her back in November 2018. At that point she had completely healed. With that being said she has now reopened and states that she wasn't aware that she was supposed to come back. 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 Subsequently she also states that although she has the compression that we got for her previously the extremitease stockings she nonetheless doesn't always wear these and being noncompliant often has things will reopen as far as ulcers on her lower extremities. No fevers, chills, nausea, or vomiting noted at this time. Patient has no evidence of dementia. She is having some discomfort in regard to the ulcers at this point. 04/28/17 on evaluation today patient appears to be doing significantly better in regard to her wounds. With that being said she is still having significant discomfort and is not really keen on sharp debridement. She does not  appear to have any evidence of infection which is good news I do think the Iodoflex is beneficial for her. Of note her ABI's were found in the system to be on the right 1.21 with a TBI of 0.76 and on the left 1.35 with a TBI of 0.75. In general her swelling appears to be significantly improved however. No fevers, chills, nausea, or vomiting noted at this time. Patient has been tolerating the wraps without complication. She has no evidence of dementia. 05/05/17-she is here in follow-up evaluation for bilateral lower extremity venous and lymphedema ulcers. She states she has not been using her lymphedema pumps secondary to pain at her knees. After discussion it was noted that she uses knee-high lymphedema pumps but does have a pair of thigh-high lymphedema pumps. She was strongly advised to use the thigh-high lymphedema pumps and if necessary reduce the setting for improved tolerance. She reports no change in odor and/or drainage. She continues to tolerate sharp debridement poorly with significant pain, primarily to the right lower extremity. We will continue with current treatment plan, along with improved lymphedema pump use and follow-up next week 05/12/17 on evaluation today patient actually appears to be doing much better in regard to her bilateral lower extremity ulcers. Only the right altar actually requires debridement today. The other two cleaned off nicely in two of the three in fact on the left lower extremity or actually almost completely healed. She does have some weeping noted still the bilateral lower extremities left greater than right. She still is not able to use her compression pumps even though I have mentioned this several times to her as did Leah last week. No fevers, chills, nausea, or vomiting noted at this time. Patient has been tolerating the dressing changes without complication. Patient has no evidence of dementia 05/19/17 on evaluation today patient appears to be doing very well  regard to her bilateral lower extremity ulcers. In fact she seems to be improving and in fact healed in a couple of locations that were giving her trouble last week. Overall I'm extremely pleased with how things have progressed. She still has some discomfort especially in the right lateral lower extremity. Fortunately there does not appear to be any evidence of significant infection which is good  news she does tell me that she has used her compression pumps several times although not routinely over the past week I did praise her for this. I do think it will be beneficial and that might even be what's helping with her wounds as far as healing is concerned at least to a degree. No fevers, chills, nausea, or vomiting noted at this time. Patient has no dementia and no evidence of infection. 05/26/17 on evaluation today patient presents for evaluation concerning her bilateral lower extremity ulcer secondary to lymphedema. Fortunately she appears to be doing very well at this point in time. Specifically her right leg appears to be completely closed and healed. The left leg though there is still a small area of opening overall has healed. She does have a little bit of excoriation at the top or the wraps are because they slid down on her. Otherwise there's no evidence of infection and the other has improved dramatically now that she is not draining as significantly. Patient is having no significant pain she tells me she has been able to use her lymphedema pumps one time a day although that's not all she can tolerate. Even that is difficult for her. 06/02/17 on evaluation today patient's ulcers appear to be completely closed at this point. She does still note some odor but I believe this is due to the fact she has not been able to wash her legs as well currently. Fortunately she did bring her compression with her today. She does have the EXTREMIT-EASE Compression. With that being said she is somewhat nervous about  going back to this as previously she broke out yet again once we discontinue wrapping her. READMISSION 11/23/17; patient is now 74 year old diabetic woman with severe chronic venous insufficiency with lymphedema. We care for her last in this clinic in February 2019 at which time she had chronic venous insufficiency with lymphedema bilateral lower extremity ulcers. We discharged her with bilateral wrap around/extremitease stockings and compression pumps. Is fairly clear she is not using the compression pumps stating it causes pain in her knees. In any case she was admitted to hospital from 8/3 through 8/6 with a several week history of recurrent bilateral ulcers. She was felt to have bilateral cellulitis treated with doxycycline IV. She is still on oral doxycycline and has another 2 days worth. At discharge her white count was 14.1 hemoglobin at 8.9. The patient is a diabetic however her last ABIs in 2018 showed a right ABI of 1.21, left of 1.35. TBI's were 0.76 on the right 0.75 on the left. Posterior tibial artery was monophasic on the right triphasic on the left. Dorsalis pedis triphasic on the right and triphasic on the left. She is not felt to have significant macrovascular disease 12/02/17;significant bilateral lower extremity wounds secondary to severe uncontrolled lymphedema. She did not get home health out to change the wraps. So she kept this on all week. She is telling me she using his her compression pumps once a week. In general all the wounds look better. We've been using silver alginate with secondary absorptive dressings 4 layer compression 12/10/17; bilateral lower extremity wounds secondary to uncontrolled lymphedema. She tells me she is using her compression pumps on most days. We've been keeping wraps on all week. Using silver alginate. She has wounds on the right lateral calf left medial and left lateral calf. All of this is somewhat better 12/16/17; the patient has 3 small open areas.  One on the right lateral calf, one on  the left medial calf and the small area on the left lateral calf. She has lymphedema, chronic venous insufficiency with hemosiderin deposition. Very fibrotic distal lower extremity scan with suggestion of an inverted bottle sign appearance to her legs. She has compression pumps but she does not use them has not used them in the last week. Currently we have her in 4 layer compression, silver alginate to the wounds and this is being changed once by home health. She is noncompliant with the compression pumps because she says it hurts her knees. She also has compression stockings ando Juxta light stockings at home 12/24/17; the patient's right lateral calf is healed as well as the left medial. There is no open wound on the right leg. She still has 2 areas on the left lateral calf. We transitioned the right leg into an extremitease stocking which she brought in from home. 12/31/17; the patient has a superficial wound on the left lateral lower calf. We've been wrapping this leg. She arrives today with the right leg swollen but without a wound. She cannot get her extremities stocking on the right leg. There is a lot more edema here. She is not using her compression pumps 01/10/2018; patient has a same superficial wound on the left lateral calf. Her right leg has less peripheral edema. She tells me she also started herself on some Lasix that had been previously prescribed for her at some point but she had never taken it. She is also concerned about whether her compression pumps are leaking. She has extremitease stockings however the edema gets bad enough she can get these on. Currently she has no open wound on the right leg and is mention the edema is actually better than when I saw this 10 days ago 01/17/2018; patient has a same superficial area on the posterior left calf and a weeping area in the mid part of the right lateral calf. She still does not have anyone from  [facility] coming to see her compression pumps which she describes as being suspicious for a leakage. I am not really sure how frequently she is using this in any case. She has massive bilateral calf lymphedema. I think there is some spreading lymphedema into her posterior thighs. 01/24/2018; the patient's edema in her legs is actually some better. She still has a smaller area on the left posterior calf although this is better. The weeping area on the right lateral calf is also better. As it turned out she did not get her pumps from [facility] but [facility] and they are going to try to go out and see these this week which is going to help. I explained to her that she will use the compression pumps once a day while we are wrapping her legs but when she transitions to stockings that may be she will require pumping twice a day. Her compliance in this area has not been high and I wonder about her ability to do this herself. 02/01/2018; the area on the posterior left leg is healed. She still has the original wound on the right lateral calf which is epithelialized. The however just above this there is still weeping lymphedema fluid. She has a new area on the upper right calf which is either wrap injury or is she points out where she has been scratching under the wraps. We still not have managed to get a hold of a representative of [facility] to see if the pump is operational. She has extremitease stockings  at home however I am not of the opinion that this will maintain her skin integrity without the compression pumps. 02/08/2018; posterior left leg remains healed although she has a new small open area on the left lateral calf. Right lateral calf has two quarter sized open areas and close juxtaposition. There is less weeping edema fluid. She apparently managed to contact [facility]. They are sending her a part. But she is still not received it 02/15/2018; the patient has  no open wound on either leg. Some scale present on the right upper lateral calf just below the fibular head. Edema control is excellent. She did not bring her stockings or her juxta lite wrap around. She has the part for her compression pump but she has not use the compression pumps yet READMISSION 01/10/2019 Patient is now a 74 year old woman that we have had in the clinic multiple times in the past. She has chronic lower extremity lymphedema, recurrent wounds on her bilateral lower extremities. She is also a type II diabetic reasonably well controlled with a recent hemoglobin A1c while she was in the hospital of 5.9. She has wounds on her bilateral lower extremities mostly on the left lateral and right lateral. These were noted also during her recent hospitalization. The patient states they have been there for a while. She has not been able to wear her stockings he has trouble getting them on as she lives alone. She also has external compression pumps but uses them sparingly because it causes pain in her knees which hurt because of osteoarthritis. The patient was on doxycycline before she went into the hospital apparently for fear of infection in her legs and is on Keflex coming out because of a UTI The patient was recently in the hospital from 01/06/2019 through 01/09/2019. She was admitted with nausea and vomiting and prerenal azotemia. This was corrected with IV fluid her Lasix has been put on hold. Hemoglobin A1c at 5.9 her metformin was discontinued. She was also felt to have a UTI she was prescribed Keflex at 503 times a day she is picking that up today. The patient is not felt to have an arterial issue. ABIs in our clinic were 1.24 on the right and 1.06 on the left. She had formal studies in 2018 at which time her ABIs were 1.21 on the right TBI of 0.76 and on the left 1.35 and 0.75 respectively. Waveforms are on the right were monophasic and biphasic, triphasic and biphasic on the left. She is  tolerated 4 layer compression in the past 10/13. Comes in today having not used her compression pumps /perhaps once in the last week. She has too much edema to consider healing these wounds. We have been using silver alginate 10/20; she tells me he had she used her compression pumps once a day as we asked. Clearly she has edema out of her legs although most of it seems to be settling around her knees which is what she complains about. We have been using silver alginate to the extensive de-epithelialized area on the left lateral calf and a much smaller area on the right lateral extending linearly into the right posterior 10/27; she is using her compression pumps daily. Her edema control is better. We have been using silver alginate to the extensive wound areas on her bilateral lower legs. This includes left anterior left lateral and left posterior. Right anterior right lateral and right posterior. The wounds are not all in the same condition. She has severe bilateral skin  damage with the epithelialization, flaking dried epithelium 11/3; she is using her compression pumps 1 times daily. Pretty obvious that her wraps fell down especially on the left leg to mid calf. I think things are improving on the right lateral calf, I do not see anything open posteriorly. The major areas on the left lateral where she has 3 or 4 deeper wounds in the midst of an entire de-epithelialized area. There is erythema here but no tenderness I think this represents venous stasis rather than cellulitis. I debrided the areas on the left lateral calf last week but once again they have tightly adherent debris over the top of them which is disappointing 11/10; the patient's major areas on the left lateral calf to 3 wounds with some depth surrounded by a large area of de-epithelialized tissue. There is no infection. We have been using silver alginate under 4-layer compression. On the right lateral she has a more contained wound area  that is superficial. Been using silver alginate under 4-layer compression 11/17; the patient's major wound is on the left lateral calf. We have been using silver alginate under compression. There is no open wound per se on the right but it de-epithelialized area on the lateral calf that is weeping edema fluid. I do not see much change here today. 12/1; patient arrives in clinic with her wounds looking better. Her edema control is better. The lymphedema specialist working on the left leg we have been working on the right. We have been using 4-layer compression. The patient is using a consider external compression pumps and Hydrofera Blue to the open areas. She is being measured for an abdominal compression pump and apparently that is going to go through her insurance 12/15; continued improvement. 2 small areas on the left and slightly larger 2 small areas on the right. 12/22; patient still has open areas on the right lateral calf which are superficial and the new one on the right anterior calf. She did not use her compression pumps this week. Her left leg is wrapped by our lymphedema specialist. There are no open wounds here 12/29; she still has a superficial area on the right lateral calf and area on the right medial calf. Right anterior wound area appears to have closed. She has much better edema control today than last week 04/11/2019; the patient is here with severe bilateral lymphedema, chronic venous insufficiency with marked skin changes in her lower extremities. We are wrapping her right leg and her lymphedema specialist is putting lymphedema wraps on the left. She states she is using her compression pumps although in the past her compliance with uses been limited because of osteoarthritic pain in her knees. She only has one remaining area on the right leg however she has wrap injuries on the left at the upper level what where I think her wraps were cause. the left lateral area left posterior.  These would be new this week 1/12; generally better. The wrap injuries in the upper calf on the left from last week are down to a single wound. She has 2 wounds anteriorly that are still weeping fluid although these appear to have been epithelialized surface for the most part. There is no open wounds on the right leg we are wrapping her right leg and 4-layer compression and we are going a lymphedema wrap on the left leg. The the approach is a Farrow 2000 wrap on the left leg and then subsequently the right leg. She claims to be compliant with her  compression pumps 1/19; the patient has 1 tiny open area on the right medial calf. The rest of this is not open. She however has a large erythematous denuded area on the left anterior tibia extending laterally. There is no evidence of infection here. She has her new current waist high compression pumps 1/26; the patient has no open wounds on the right. On the left anteriorly much better than last week still a smattering of small weeping open areas. There is no evidence of infection 2/2; still no open wounds on the right leg. He still does not have a external compression garment. She arrives in clinic today with an open area on the plantar aspect of the left second toe at the base of the PIP. This apparently was painful last week so it was not wrapped by our lymphedema specialist. There was no open wound at the time. The patient denies traumatizing the toe however today she arrives with a small but deep wound in this area the dorsal surface of the toe is very tender. 2/11; she arrives in clinic today with everything epithelialized. She was that way last week on the right but we have still been wrapping. She has been using lymphedema wraps on the left. On careful inspection of the left leg she still has areas that look vulnerable but no clearly open wound there may be some weeping through cracks in the skin. She is using above the waist external compression  pumps and she has a separate external compression garment for her thighs. The plan is to discharge her for follow-up in lymphedema clinic with Juliann Pulse our lymphedema specialist. In the past healing this woman has resulted in relatively quick breakdown however she generally seems more motivated example willing to use her external compression pumps etc. READMISSION 02/13/2020. Mrs. Levit is a now 74 year old woman who has bilateral lower extremity lymphedema and a history of difficult to treat bilateral lower extremity wounds. When she was here last time we had her seen by a lymphedema specialist. She had lower extremity compression stockings as well as compression garments for her thighs. She has compression pumps as well. Unfortunately she has not been doing well. She describes unrelenting nausea with vomiting anytime she tries to eat or drink or take her medications. She was seen in urgent care on 10/26. Notably they found her to be somewhat dehydrated. Gave her a liter of fluid. Her creatinine was up to 2.42. The rest of her lab work including liver function tests, white count, hemoglobin were all normal. She did have a urine for culture that showed Klebsiella she was prescribed ciprofloxacin but I seriously doubt that she is able to keep this down by her description. She has not taken any of her meds in over a month. She states she feels too weak to take care of her lower extremities with compression garments and her external compression pumps she has not been using any of these. As a result she has 2 large areas on the left lateral calf one anteriorly a cluster of 3 on the right lateral and a small area on her right ankle. All of these are superficial. She has poorly controlled lymphedema especially on the anterior tibial areas however by my my anxiety she actually looks like she has less swelling than what I remember. Past medical history includes type 2 diabetes with a recent hemoglobin A1c of 6.8  chronic venous insufficiency, lymphedema which she was managing with above the waist compression and compression pumps, diastolic congestive  heart failure. Nausea and vomiting as described above. We did not repeat her arterial studies today however these were quite normal when they were last done in 2018 with an ABI of 1.21 on the right and 1.35 on the left. TBI's were within normal limits at 0.76 and 0.75 she had triphasic waveforms on the left monophasic waveforms at the posterior tibial but triphasic waveforms at the dorsalis pedis. She has previously tolerated 4 layer compression Patient History Information obtained from Patient. Allergies oxycodone HCl (Severity: Mild, Reaction: nausea, vomiting), penicillin (Severity: Moderate, Reaction: itching), adhesive (Reaction: rash) Family History Cancer - Mother- lung, Diabetes - maternal uncle, Lung Disease - Father- COPD, No family history of Heart Disease, Hereditary Spherocytosis, Hypertension, Kidney Disease, Seizures, Stroke, Thyroid Problems, Tuberculosis. Social History Former smoker - 2ppd cigarette x 36yr - quit 11/23/1985, Marital Status - Single, Alcohol Use - Never, Drug Use - No History, Caffeine Use - Moderate - coffee, tea, soda. Medical History Eyes Patient has history of Cataracts - both eyes Denies history of Glaucoma, Optic Neuritis Ear/Nose/Mouth/Throat Denies history of Chronic sinus problems/congestion, Middle ear problems Hematologic/Lymphatic Patient has history of Lymphedema - BLE Denies history of Anemia, Hemophilia, Human Immunodeficiency Virus, Sickle Cell Disease Respiratory Denies history of Aspiration, Asthma, Chronic Obstructive Pulmonary Disease (COPD), Pneumothorax, Sleep Apnea, Tuberculosis Cardiovascular Patient has history of Arrhythmia - PAC, PVC,SVT,MURMUR, T achycardia, Congestive Heart Failure, Hypertension, Peripheral Venous Disease - bilat Denies history of Angina, Coronary Artery Disease, Deep  Vein Thrombosis, Hypotension, Myocardial Infarction, Peripheral Arterial Disease, Phlebitis, Vasculitis Gastrointestinal Denies history of Cirrhosis , Colitis, Crohnoos, Hepatitis A, Hepatitis B, Hepatitis C Endocrine Patient has history of Type II Diabetes - does not check BG Denies history of Type I Diabetes Genitourinary Denies history of End Stage Renal Disease Immunological Denies history of Lupus Erythematosus, Raynaudoos, Scleroderma Integumentary (Skin) Denies history of History of Burn Musculoskeletal Patient has history of Osteoarthritis - bilat. knees, spine, back, fingers Denies history of Gout, Rheumatoid Arthritis, Osteomyelitis Neurologic Patient has history of Neuropathy Denies history of Dementia, Quadriplegia, Paraplegia, Seizure Disorder Oncologic Denies history of Received Chemotherapy, Received Radiation Psychiatric Denies history of Anorexia/bulimia, Confinement Anxiety Hospitalization/Surgery History - cesarean section. - US echocardiography EF 55-60%. - SVT ablation. Medical A Surgical History Notes nd Constitutional Symptoms (General Health) morbid obesity , physical deconditioning "feeling weAK" Ear/Nose/Mouth/Throat allergic rhinitis Respiratory pneumonia as child Cardiovascular aortic stenosis, morbid obesity , hypokalemia , Gastrointestinal GERD , mixed incontinence Genitourinary acute kidney injury , mixed incontinence Musculoskeletal arthritis , multilevel spine pain Review of Systems (ROS) Constitutional Symptoms (General Health) Complains or has symptoms of Fatigue. Eyes Complains or has symptoms of Glasses / Contacts. Ear/Nose/Mouth/Throat Denies complaints or symptoms of Chronic sinus problems or rhinitis. Respiratory Denies complaints or symptoms of Chronic or frequent coughs, Shortness of Breath. Cardiovascular Denies complaints or symptoms of Chest pain. Gastrointestinal Complains or has symptoms of Nausea, Vomiting. Denies  complaints or symptoms of Frequent diarrhea. Genitourinary Denies complaints or symptoms of Frequent urination. Integumentary (Skin) Complains or has symptoms of Wounds - bilateral lower legs. Musculoskeletal Complains or has symptoms of Muscle Weakness. Neurologic Complains or has symptoms of Numbness/parasthesias - feet. Psychiatric Denies complaints or symptoms of Claustrophobia, Suicidal. Objective Constitutional Sitting or standing Blood Pressure is within target range for patient.. Pulse regular and within target range for patient.Marland Kitchen Respirations regular, non-labored and within target range.. Temperature is normal and within the target range for the patient.Marland Kitchen Appears in no distress however she appears gaunt and looks as though she has  lost weight.. Vitals Time Taken: 3:00 PM, Height: 71 in, Source: Stated, Weight: 260 lbs, Source: Stated, BMI: 36.3, Temperature: 98.4 F, Pulse: 85 bpm, Respiratory Rate: 18 breaths/min, Blood Pressure: 112/74 mmHg. Cardiovascular Heart rhythm and rate regular, without murmur or gallop. I would say she is mildly dehydrated poor skin turgor. Mucous membranes however moist. Fetal pulses are palpable. Edema present in both extremities.. Gastrointestinal (GI) She had no tenderness to deep palpation in the right upper quadrant there were no masses. No liver or spleen enlargement. Genitourinary (GU) No suprapubic or costovertebral angle tenderness. Psychiatric Patient appears depressed today.. General Notes: Wound exam; the amount of edema in her lower extremities seems less than what I remember in spite of the fact she is not using any of her compression garments or pumps. Nevertheless she has uncontrolled edema. Superficial wounds on the left lateral left anterior and right lateral and several areas that look like they are at risk of breaking down. Integumentary (Hair, Skin) Wound #42 status is Open. Original cause of wound was Gradually Appeared. The  wound is located on the Left,Anterior Lower Leg. The wound measures 2.5cm length x 3.8cm width x 0.1cm depth; 7.461cm^2 area and 0.746cm^3 volume. There is Fat Layer (Subcutaneous Tissue) exposed. There is no tunneling or undermining noted. There is a medium amount of serosanguineous drainage noted. The wound margin is flat and intact. There is large (67-100%) red, pink granulation within the wound bed. There is a small (1-33%) amount of necrotic tissue within the wound bed including Adherent Slough. Wound #43 status is Open. Original cause of wound was Gradually Appeared. The wound is located on the Left,Lateral Lower Leg. The wound measures 2cm length x 4.5cm width x 0.1cm depth; 7.069cm^2 area and 0.707cm^3 volume. There is Fat Layer (Subcutaneous Tissue) exposed. There is no tunneling or undermining noted. There is a medium amount of serosanguineous drainage noted. The wound margin is flat and intact. There is small (1-33%) pink granulation within the wound bed. There is a large (67-100%) amount of necrotic tissue within the wound bed including Adherent Slough. Wound #44 status is Open. Original cause of wound was Gradually Appeared. The wound is located on the Right,Lateral Lower Leg. The wound measures 2.8cm length x 5cm width x 0.1cm depth; 10.996cm^2 area and 1.1cm^3 volume. There is Fat Layer (Subcutaneous Tissue) exposed. There is no tunneling or undermining noted. There is a medium amount of serosanguineous drainage noted. The wound margin is flat and intact. There is large (67-100%) red granulation within the wound bed. There is no necrotic tissue within the wound bed. Wound #45 status is Open. Original cause of wound was Gradually Appeared. The wound is located on the Left,Distal,Lateral Lower Leg. The wound measures 4.2cm length x 4.8cm width x 0.1cm depth; 15.834cm^2 area and 1.583cm^3 volume. There is Fat Layer (Subcutaneous Tissue) exposed. There is no tunneling or undermining noted.  There is a medium amount of serosanguineous drainage noted. The wound margin is flat and intact. There is small (1-33%) pink granulation within the wound bed. There is a large (67-100%) amount of necrotic tissue within the wound bed including Adherent Slough. Wound #46 status is Open. Original cause of wound was Gradually Appeared. The wound is located on the Right,Distal,Lateral Lower Leg. The wound measures 1.1cm length x 0.7cm width x 0.1cm depth; 0.605cm^2 area and 0.06cm^3 volume. There is Fat Layer (Subcutaneous Tissue) exposed. There is no tunneling or undermining noted. There is a small amount of serosanguineous drainage noted. The wound margin is  flat and intact. There is small (1-33%) pink granulation within the wound bed. There is a large (67-100%) amount of necrotic tissue within the wound bed including Adherent Slough. Assessment Active Problems ICD-10 Non-pressure chronic ulcer of other part of left lower leg limited to breakdown of skin Non-pressure chronic ulcer of other part of right lower leg limited to breakdown of skin Lymphedema, not elsewhere classified Dehydration Nausea with vomiting, unspecified Procedures Wound #42 Pre-procedure diagnosis of Wound #42 is a Lymphedema located on the Left,Anterior Lower Leg . There was a Four Layer Compression Therapy Procedure by Carlene Coria, RN. Post procedure Diagnosis Wound #42: Same as Pre-Procedure Wound #43 Pre-procedure diagnosis of Wound #43 is a Lymphedema located on the Left,Lateral Lower Leg . There was a Four Layer Compression Therapy Procedure by Carlene Coria, RN. Post procedure Diagnosis Wound #43: Same as Pre-Procedure Wound #44 Pre-procedure diagnosis of Wound #44 is a Lymphedema located on the Right,Lateral Lower Leg . There was a Four Layer Compression Therapy Procedure by Carlene Coria, RN. Post procedure Diagnosis Wound #44: Same as Pre-Procedure Wound #45 Pre-procedure diagnosis of Wound #45 is a Lymphedema  located on the Left,Distal,Lateral Lower Leg . There was a Four Layer Compression Therapy Procedure by Carlene Coria, RN. Post procedure Diagnosis Wound #45: Same as Pre-Procedure Wound #46 Pre-procedure diagnosis of Wound #46 is a Lymphedema located on the Right,Distal,Lateral Lower Leg . There was a Four Layer Compression Therapy Procedure by Carlene Coria, RN. Post procedure Diagnosis Wound #46: Same as Pre-Procedure Plan Follow-up Appointments: Return Appointment in 1 week. Dressing Change Frequency: Do not change entire dressing for one week. Skin Barriers/Peri-Wound Care: Moisturizing lotion - LOWER LEGS TCA Cream or Ointment - LOWER LEGS Wound Cleansing: May shower with protection. Primary Wound Dressing: Calcium Alginate with Silver - ALL WOUNDS Secondary Dressing: Dry Gauze - ALL WOUNDS ABD pad - ALL WOUNDS Edema Control: 4 layer compression - Bilateral Avoid standing for long periods of time Elevate legs to the level of the heart or above for 30 minutes daily and/or when sitting, a frequency of: Exercise regularly Segmental Compressive Device. - DIALY FOR 1 HOUR 1. With regards to her wound she has not been using any of the specialized compression garments we provided for her last time. She came in with a juxta light in his [employer] however these were not on properly. She is not using her compression pumps. She has several areas of breakdown fortunately none of them that dramatic however it looks as though she has some that are going to break down further 2. Unfortunately the issue we are dealing with with her lower extremity wounds and lymphedema does not seem like the patient's major problem. She looks gaunt and looks as though she has lost weight. I would say based at the bedside assessment that she has a mild degree of dehydration again. They wanted to admit her from urgent care on 10/26 but she adamantly refused. She is going to follow-up with her primary physician on  Thursday of this week. 3. She had a culture result that showed Klebsiella in her urine not sure I could pick up that she was truly symptomatic from that and this could very well of been asymptomatic bacteria. Nevertheless given the nausea and the issues she was having I do not disagree with trying to treat this but I do not think she was able to get any of her antibiotics down 4. We put silver alginate on the wounds ABDs under 4-layer compression which she  has tolerated well in the past. I hope we can identify what is wrong with this patient I do not know that she can continue on her own at home unless we determine the cause of which she is describing Electronic Signature(s) Signed: 02/14/2020 9:36:19 AM By: Linton Ham MD Entered By: Linton Ham on 02/13/2020 17:30:48 -------------------------------------------------------------------------------- HxROS Details Patient Name: Date of Service: Stroot, [name] 02/13/2020 2:45 PM Medical Record Number: [id number] Patient Account Number: [id number] Date of Birth/Sex: Treating RN: 04-10-1945 (74 y.o. Elam Dutch Primary Care Provider: [name] Other Clinician: Referring Provider: Treating Provider/Extender: [name] in Treatment: 0 Information Obtained From Patient Constitutional Symptoms (General Health) Complaints and Symptoms: Positive for: Fatigue Medical History: Past Medical History Notes: morbid obesity , physical deconditioning "feeling weAK" Eyes Complaints and Symptoms: Positive for: Glasses / Contacts Medical History: Positive for: Cataracts - both eyes Negative for: Glaucoma; Optic Neuritis Ear/Nose/Mouth/Throat Complaints and Symptoms: Negative for: Chronic sinus problems or rhinitis Medical History: Negative for: Chronic sinus problems/congestion; Middle ear problems Past Medical History Notes: allergic rhinitis Respiratory Complaints and  Symptoms: Negative for: Chronic or frequent coughs; Shortness of Breath Medical History: Negative for: Aspiration; Asthma; Chronic Obstructive Pulmonary Disease (COPD); Pneumothorax; Sleep Apnea; Tuberculosis Past Medical History Notes: pneumonia as child Cardiovascular Complaints and Symptoms: Negative for: Chest pain Medical History: Positive for: Arrhythmia - PAC, PVC,SVT ,MURMUR, T achycardia; Congestive Heart Failure; Hypertension; Peripheral Venous Disease - bilat Negative for: Angina; Coronary Artery Disease; Deep Vein Thrombosis; Hypotension; Myocardial Infarction; Peripheral Arterial Disease; Phlebitis; Vasculitis Past Medical History Notes: aortic stenosis, morbid obesity , hypokalemia , Gastrointestinal Complaints and Symptoms: Positive for: Nausea; Vomiting Negative for: Frequent diarrhea Medical History: Negative for: Cirrhosis ; Colitis; Crohns; Hepatitis A; Hepatitis B; Hepatitis C Past Medical History Notes: GERD , mixed incontinence Genitourinary Complaints and Symptoms: Negative for: Frequent urination Medical History: Negative for: End Stage Renal Disease Past Medical History Notes: acute kidney injury , mixed incontinence Integumentary (Skin) Complaints and Symptoms: Positive for: Wounds - bilateral lower legs Medical History: Negative for: History of Burn Musculoskeletal Complaints and Symptoms: Positive for: Muscle Weakness Medical History: Positive for: Osteoarthritis - bilat. knees, spine, back, fingers Negative for: Gout; Rheumatoid Arthritis; Osteomyelitis Past Medical History Notes: arthritis , multilevel spine pain Neurologic Complaints and Symptoms: Positive for: Numbness/parasthesias - feet Medical History: Positive for: Neuropathy Negative for: Dementia; Quadriplegia; Paraplegia; Seizure Disorder Psychiatric Complaints and Symptoms: Negative for: Claustrophobia; Suicidal Medical History: Negative for: Anorexia/bulimia; Confinement  Anxiety Hematologic/Lymphatic Medical History: Positive for: Lymphedema - BLE Negative for: Anemia; Hemophilia; Human Immunodeficiency Virus; Sickle Cell Disease Endocrine Medical History: Positive for: Type II Diabetes - does not check BG Negative for: Type I Diabetes Time with diabetes: 3 years Treated with: Oral agents Blood sugar tested every day: No Immunological Medical History: Negative for: Lupus Erythematosus; Raynauds; Scleroderma Oncologic Medical History: Negative for: Received Chemotherapy; Received Radiation HBO Extended History Items Eyes: Cataracts Immunizations Pneumococcal Vaccine: Received Pneumococcal Vaccination: No Tetanus Vaccine: Last tetanus shot: 04/06/2010 Implantable Devices None Hospitalization / Surgery History Type of Hospitalization/Surgery cesarean section US echocardiography EF 55-60% SVT ablation Family and Social History Cancer: Yes - Mother- lung; Diabetes: Yes - maternal uncle; Heart Disease: No; Hereditary Spherocytosis: No; Hypertension: No; Kidney Disease: No; Lung Disease: Yes - Father- COPD; Seizures: No; Stroke: No; Thyroid Problems: No; Tuberculosis: No; Former smoker - 2ppd cigarette x 73yr - quit 11/23/1985; Marital Status - Single; Alcohol Use: Never; Drug Use: No History; Caffeine Use: Moderate - coffee,  tea, soda; Financial Concerns: Yes - medical; Food, Clothing or Shelter Needs: No; Support System Lacking: No; Transportation Concerns: No Electronic Signature(s) Signed: 02/14/2020 9:36:19 AM By: Linton Ham MD Signed: 02/14/2020 5:57:36 PM By: Baruch Gouty RN, BSN Entered By: Baruch Gouty on 02/13/2020 15:31:41 -------------------------------------------------------------------------------- SuperBill Details Patient Name: Date of Service: DALIA, JOLLIE 02/13/2020 Medical Record Number: [id number] Patient Account Number: [id number] Date of Birth/Sex: Treating RN: 1946/02/14 (74 y.o. [name] Primary  Care Provider: [name] Other Clinician: Referring Provider: Treating Provider/Extender: [name] in Treatment: 0 Diagnosis Coding ICD-10 Codes Code Description [phone] Non-pressure chronic ulcer of other part of left lower leg limited to breakdown of skin L97.811 Non-pressure chronic ulcer of other part of right lower leg limited to breakdown of skin I89.0 Lymphedema, not elsewhere classified E86.0 Dehydration R11.2 Nausea with vomiting, unspecified Facility Procedures CPT4: Code [id number] Description: 13 - WOUND CARE VISIT-LEV 3 EST PT Modifier: 25 Quantity: 1 CPT4: [id number] Description: 81 BILATERAL: Application of multi-layer venous compression system; leg (below knee), including ankle and t. Modifier: Quantity: 1 Physician Procedures : CPT4 Code Description Modifier [id number] - WC PHYS LEVEL 4 - EST PT ICD-10 Diagnosis Description L97.821 Non-pressure chronic ulcer of other part of left lower leg limited to breakdown of skin L97.811 Non-pressure chronic ulcer of other part of  right lower leg limited to breakdown of skin I89.0 Lymphedema, not elsewhere classified E86.0 Dehydration Quantity: 1 Electronic Signature(s) Signed: 02/14/2020 9:36:19 AM By: Linton Ham MD Signed: 02/14/2020 5:51:14 PM By: Carlene Coria RN Entered By: Carlene Coria on 02/13/2020 17:44:36

## 2020-02-15 ENCOUNTER — Encounter: Payer: Self-pay | Admitting: Internal Medicine

## 2020-02-15 ENCOUNTER — Ambulatory Visit (INDEPENDENT_AMBULATORY_CARE_PROVIDER_SITE_OTHER): Payer: PPO | Admitting: Internal Medicine

## 2020-02-15 ENCOUNTER — Other Ambulatory Visit: Payer: Self-pay

## 2020-02-15 VITALS — BP 120/76 | HR 97 | Temp 98.4°F | Ht 71.0 in | Wt 250.0 lb

## 2020-02-15 DIAGNOSIS — I10 Essential (primary) hypertension: Secondary | ICD-10-CM

## 2020-02-15 DIAGNOSIS — R112 Nausea with vomiting, unspecified: Secondary | ICD-10-CM

## 2020-02-15 DIAGNOSIS — N179 Acute kidney failure, unspecified: Secondary | ICD-10-CM | POA: Diagnosis not present

## 2020-02-15 DIAGNOSIS — B961 Klebsiella pneumoniae [K. pneumoniae] as the cause of diseases classified elsewhere: Secondary | ICD-10-CM | POA: Diagnosis not present

## 2020-02-15 DIAGNOSIS — E119 Type 2 diabetes mellitus without complications: Secondary | ICD-10-CM

## 2020-02-15 DIAGNOSIS — R1110 Vomiting, unspecified: Secondary | ICD-10-CM | POA: Insufficient documentation

## 2020-02-15 DIAGNOSIS — N3090 Cystitis, unspecified without hematuria: Secondary | ICD-10-CM

## 2020-02-15 DIAGNOSIS — R34 Anuria and oliguria: Secondary | ICD-10-CM

## 2020-02-15 LAB — CBC
HCT: 36 % (ref 36.0–46.0)
Hemoglobin: 11.5 g/dL — ABNORMAL LOW (ref 12.0–15.0)
MCHC: 32 g/dL (ref 30.0–36.0)
MCV: 77.3 fl — ABNORMAL LOW (ref 78.0–100.0)
Platelets: 282 10*3/uL (ref 150.0–400.0)
RBC: 4.65 Mil/uL (ref 3.87–5.11)
RDW: 19.5 % — ABNORMAL HIGH (ref 11.5–15.5)
WBC: 5.1 10*3/uL (ref 4.0–10.5)

## 2020-02-15 LAB — COMPREHENSIVE METABOLIC PANEL
ALT: 14 U/L (ref 0–35)
AST: 30 U/L (ref 0–37)
Albumin: 3.8 g/dL (ref 3.5–5.2)
Alkaline Phosphatase: 55 U/L (ref 39–117)
BUN: 37 mg/dL — ABNORMAL HIGH (ref 6–23)
CO2: 21 mEq/L (ref 19–32)
Calcium: 9.9 mg/dL (ref 8.4–10.5)
Chloride: 101 mEq/L (ref 96–112)
Creatinine, Ser: 1.56 mg/dL — ABNORMAL HIGH (ref 0.40–1.20)
GFR: 32.82 mL/min — ABNORMAL LOW (ref >60.00)
Glucose, Bld: 57 mg/dL — ABNORMAL LOW (ref 70–99)
Potassium: 4.7 mEq/L (ref 3.5–5.1)
Sodium: 137 mEq/L (ref 135–145)
Total Bilirubin: 0.5 mg/dL (ref 0.2–1.2)
Total Protein: 8.3 g/dL (ref 6.0–8.3)

## 2020-02-15 LAB — LIPASE: Lipase: 65 U/L — ABNORMAL HIGH (ref 11.0–59.0)

## 2020-02-15 MED ORDER — FOSFOMYCIN TROMETHAMINE 3 G PO PACK: 3.0000 g | PACK | Freq: Once | ORAL | 0 refills | Status: AC

## 2020-02-15 NOTE — Patient Instructions (Addendum)
We have sent in fosfomycin to take today to clear the urine infection.   Try to take the prilosec for the nausea to see if this can help some too.   Use the ondansetron to get the nausea.   We are checking labs today to see how the kidneys are doing.   We want you to call us Friday or Monday and let us know how you are doing so we can adjust the plan if needed.

## 2020-02-15 NOTE — Assessment & Plan Note (Signed)
Prior creatinine 2.0 with baseline around 1. Will recheck today and if worsening will strongly counsel to go to hospital. She has refused in the past and strongly does not want to unless absolutely needed. We discussed today the risk of permanent renal damage with ongoing dehydration and abnormal renal function. Adjust as needed. Trying to address UTI to see if we can resolve the nausea/vomiting.

## 2020-02-15 NOTE — Assessment & Plan Note (Signed)
Suspect ongoing dehydration due to poor intake and nausea/vomiting.

## 2020-02-15 NOTE — Progress Notes (Signed)
   Subjective:   Patient ID: [name] [name], female    DOB: 04-21-1945, 74 y.o.   MRN: [id number]  HPI The patient is a 74 YO female coming in for follow up of her recent health problems. She has been having nausea consistently for the last 3 or so weeks. She has had several episodes of this over the years most recently about a year or so ago. She was seen at urgent care and this clinic and got fluids from urgent care. She has been tried with zofran and phenergan for nausea which she cannot keep down phenergan and feels it does not help. She can keep down ondansetron but is not sure it does much. She denies diarrhea. Not having BM in last week due to eating nothing in a couple weeks. She is trying to keep down fluids but sometimes even water will be vomited. She is peeing but much less than usual. She is having darker urine. Denies burning on urination. Found to have UTI at last clinic visit and prescribed cipro which she tried to take and only kept down maybe 1 or 2 pills. She denies fevers or chills. Is very weak and tired.   Review of Systems  Constitutional: Positive for activity change, appetite change and fatigue.  HENT: Negative.   Eyes: Negative.   Respiratory: Negative for cough, chest tightness and shortness of breath.   Cardiovascular: Negative for chest pain, palpitations and leg swelling.  Gastrointestinal: Positive for nausea and vomiting. Negative for abdominal distention, abdominal pain, anal bleeding, blood in stool, constipation, diarrhea and rectal pain.  Musculoskeletal: Positive for gait problem.  Skin: Negative.   Neurological: Positive for weakness.  Psychiatric/Behavioral: Negative.     Objective:  Physical Exam Constitutional:      Appearance: She is well-developed.     Comments: Appears dehydrated and weak  HENT:     Head: Normocephalic and atraumatic.  Cardiovascular:     Rate and Rhythm: Normal rate and regular rhythm.  Pulmonary:     Effort: Pulmonary effort  is normal. No respiratory distress.     Breath sounds: Normal breath sounds. No wheezing or rales.  Abdominal:     General: There is no distension.     Palpations: Abdomen is soft.     Tenderness: There is no abdominal tenderness. There is no rebound.     Comments: Slow BM but present  Musculoskeletal:     Cervical back: Normal range of motion.     Right lower leg: Edema present.     Left lower leg: Edema present.     Comments: Stable lymphedema bilateral legs  Skin:    General: Skin is warm and dry.  Neurological:     Mental Status: She is alert and oriented to person, place, and time.     Coordination: Coordination abnormal.     Comments: Walker to ambulate and slower than usual     Vitals:   02/15/20 0803  BP: 120/76  Pulse: 97  Temp: 98.4 F (36.9 C)  SpO2: 97%  Weight: 250 lb (113.4 kg)  Height: 5\' 11"  (1.803 m)    This visit occurred during the SARS-CoV-2 public health emergency.  Safety protocols were in place, including screening questions prior to the visit, additional usage of staff PPE, and extensive cleaning of exam room while observing appropriate contact time as indicated for disinfecting solutions.   Assessment & Plan:

## 2020-02-15 NOTE — Assessment & Plan Note (Signed)
She has rx for zofran which she is able to keep down. Appears to have klebsiella UTI and rx for cipro about 2 weeks ago which she was unable to take. She did not contact office about this. Rx fosfomycin and recheck urine studies today if she is able to urinate. She is having ongoing symptoms and AKI. Declines ER or urgent care unless change in labs.

## 2020-02-15 NOTE — Assessment & Plan Note (Signed)
Advised not to take metformin due to poor intake and AKI with abnormal creatinine and GFR.

## 2020-02-15 NOTE — Assessment & Plan Note (Signed)
Advised not to take losartan or lasix at this time due to dehydration and poor intake. BP okay today.

## 2020-02-15 NOTE — Assessment & Plan Note (Signed)
Rx fosfomycin to help cover given nausea and vomiting she is unable to take any pills. If too expensive or unable to tolerate alternative is IM ceftriaxone at the clinic for 3 days (okay for today, Friday, Monday).

## 2020-02-16 ENCOUNTER — Telehealth: Payer: Self-pay | Admitting: Internal Medicine

## 2020-02-16 ENCOUNTER — Ambulatory Visit (INDEPENDENT_AMBULATORY_CARE_PROVIDER_SITE_OTHER): Payer: PPO | Admitting: Podiatry

## 2020-02-16 ENCOUNTER — Encounter: Payer: Self-pay | Admitting: Podiatry

## 2020-02-16 DIAGNOSIS — E1142 Type 2 diabetes mellitus with diabetic polyneuropathy: Secondary | ICD-10-CM | POA: Diagnosis not present

## 2020-02-16 DIAGNOSIS — I890 Lymphedema, not elsewhere classified: Secondary | ICD-10-CM

## 2020-02-16 DIAGNOSIS — M79675 Pain in left toe(s): Secondary | ICD-10-CM | POA: Diagnosis not present

## 2020-02-16 DIAGNOSIS — M79674 Pain in right toe(s): Secondary | ICD-10-CM

## 2020-02-16 DIAGNOSIS — B351 Tinea unguium: Secondary | ICD-10-CM | POA: Diagnosis not present

## 2020-02-16 NOTE — Progress Notes (Signed)
Subjective:  Patient ID: [name] [name], female    DOB: 08-17-1945,  MRN: [id number]  74 y.o. female presents with preventative diabetic foot care and painful thick toenails that are difficult to trim. Pain interferes with ambulation. Aggravating factors include wearing enclosed shoe gear. Pain is relieved with periodic professional debridement..    She voices no new pedal problems on today's visit.  PCP: Hoyt Koch, MD and last visit was: 02/15/2020.  Review of Systems: Negative except as noted in the HPI.  Past Medical History:  Diagnosis Date  . Acute kidney injury ([place])   . Aortic stenosis, mild 11/17/2013  . Arthritis    "both knees, spine, back, fingers" (11/29/2013)  . CHF (congestive heart failure) ([place])   . Edema   . GERD (gastroesophageal reflux disease)   . Heart murmur   . History of diastolic dysfunction    Echo 09/2950 with diastolic dysfunction  . HTN (hypertension)   . Morbid obesity ([place])   . OA (osteoarthritis)   . PAC (premature atrial contraction)    per prior Holter  . Palpitations   . Pneumonia    "as a child"  . PVC's (premature ventricular contractions)    per prior Holter  . SVT (supraventricular tachycardia) ([place])   . Tachycardia    noted at 07/28/11 visit. Started on beta blocker. Possible atrial flutter vs long PR tachycardia/AVNRT  . Type II diabetes mellitus ([place])    Past Surgical History:  Procedure Laterality Date  . CESAREAN SECTION  1985  . CESAREAN SECTION  1985  . SUPRAVENTRICULAR TACHYCARDIA ABLATION  11/29/2013  . SUPRAVENTRICULAR TACHYCARDIA ABLATION N/A 11/29/2013   Procedure: SUPRAVENTRICULAR TACHYCARDIA ABLATION;  Surgeon: Evans Lance, MD;  Location: [facility] CATH LAB;  Service: Cardiovascular;  Laterality: N/A;  . US ECHOCARDIOGRAPHY  07/25/2008   EF 55-60%  . VAGINAL DELIVERY     Patient Active Problem List   Diagnosis Date Noted  . Intractable vomiting 02/15/2020  . Klebsiella cystitis 02/15/2020  . Bilateral leg  ulcer, limited to breakdown of skin ([place]) 01/16/2020  . Decreased urination 01/16/2020  . Dehydration 01/16/2020  . Allergic rhinitis 07/28/2019  . Postnasal drip 11/16/2017  . Sensorineural hearing loss (SNHL), bilateral 11/16/2017  . Tinnitus of both ears 11/16/2017  . Obesity, Class II, BMI 35-39.9, isolated (see actual BMI) 11/08/2017  . CKD (chronic kidney disease), stage III ([place]) 11/08/2017  . Anemia 11/07/2017  . Thrombocytosis 11/07/2017  . Lymphedema 11/07/2017  . AKI (acute kidney injury) ([place]) 10/16/2017  . Routine general medical examination at a health care facility 05/10/2014  . Paroxysmal SVT (supraventricular tachycardia) ([place]) 11/28/2013  . Physical deconditioning 11/23/2013  . Generalized weakness 11/17/2013  . Nausea 11/17/2013  . Multilevel spine pain 11/17/2013  . Aortic stenosis, mild 11/17/2013  . DM type 2 (diabetes mellitus, type 2) ([place]) 02/02/2013  . Arthritis 10/19/2012  . Mixed incontinence 10/19/2012  . GERD (gastroesophageal reflux disease) 04/20/2011  . HTN (hypertension) 01/20/2011  . Morbid obesity ([place])     Current Outpatient Medications:  .  acetaminophen (TYLENOL) 325 MG tablet, Take 2 tablets (650 mg total) by mouth every 6 (six) hours as needed for mild pain (or Fever >/= 101)., Disp: , Rfl:  .  aspirin EC 81 MG tablet, Take 81 mg by mouth daily with breakfast. Takes once or twice weekly, Disp: , Rfl:  .  celecoxib (CELEBREX) 100 MG capsule, TAKE 1 CAPSULE BY MOUTH TWICE A DAY, Disp: 180 capsule, Rfl: 0 .  ciprofloxacin (CIPRO)  250 MG tablet, Take 1 tablet (250 mg total) by mouth 2 (two) times daily., Disp: 10 tablet, Rfl: 0 .  feeding supplement, ENSURE COMPLETE, (ENSURE COMPLETE) LIQD, Take 237 mLs by mouth 2 (two) times daily between meals., Disp: 13272 mL, Rfl: 11 .  furosemide (LASIX) 40 MG tablet, TAKE 1 TABLET BY MOUTH EVERY DAY, Disp: 90 tablet, Rfl: 3 .  losartan (COZAAR) 25 MG tablet, TAKE 4 TABLETS (100 MG TOTAL) BY MOUTH DAILY.,  Disp: 360 tablet, Rfl: 3 .  metFORMIN (GLUCOPHAGE) 500 MG tablet, Take 1 tablet (500 mg total) by mouth daily with breakfast., Disp: 90 tablet, Rfl: 3 .  methocarbamol (ROBAXIN) 500 MG tablet, Take 1 tablet (500 mg total) by mouth every 6 (six) hours as needed for muscle spasms., Disp: 30 tablet, Rfl: 0 .  metoprolol tartrate (LOPRESSOR) 50 MG tablet, TAKE 1 TABLET BY MOUTH TWICE A DAY, Disp: 180 tablet, Rfl: 3 .  montelukast (SINGULAIR) 10 MG tablet, Take 1 tablet (10 mg total) by mouth at bedtime., Disp: 90 tablet, Rfl: 3 .  mupirocin ointment (BACTROBAN) 2 %, SMARTSIG:1 Application Topical 2-3 Times Daily, Disp: , Rfl:  .  Neomycin-Bacitracin-Polymyxin ([employer] ANTIBIOTIC) [phone] OINT, APPLY AFTER CLEANING BEFORE WRAPPING LGS (Patient taking differently: Apply 1 application topically daily. Apply after cleaning before wrapping lgs), Disp: 453.9 g, Rfl: 0 .  nutrition supplement, JUVEN, (JUVEN) PACK, Take 1 packet by mouth 2 (two) times daily between meals., Disp: 180 packet, Rfl: 3 .  ondansetron (ZOFRAN) 4 MG tablet, Take 1 tablet (4 mg total) by mouth every 8 (eight) hours as needed for nausea or vomiting., Disp: 30 tablet, Rfl: 1 .  pantoprazole (PROTONIX) 40 MG tablet, TAKE 1 TABLET BY MOUTH EVERY DAY, Disp: 90 tablet, Rfl: 3 .  polyethylene glycol (MIRALAX / GLYCOLAX) packet, Take 17 g by mouth daily., Disp: 14 each, Rfl: 0 .  promethazine (PHENERGAN) 25 MG tablet, Take 1 tablet (25 mg total) by mouth every 8 (eight) hours as needed for refractory nausea / vomiting., Disp: 15 tablet, Rfl: 0 .  RESTASIS 0.05 % ophthalmic emulsion, 1 drop 2 (two) times daily., Disp: , Rfl:  Allergies  Allergen Reactions  . Oxycodone Hcl Nausea And Vomiting  . Penicillins Itching    Has patient had a PCN reaction causing immediate rash, facial/tongue/throat swelling, SOB or lightheadedness with hypotension: No Has patient had a PCN reaction causing severe rash involving mucus membranes or skin necrosis:  No Has patient had a PCN reaction that required hospitalization: No Has patient had a PCN reaction occurring within the last 10 years: No If all of the above answers are "NO", then may proceed with Cephalosporin use.   Social History   Tobacco Use  Smoking Status Former Smoker  . Packs/day: 2.00  . Years: 15.00  . Pack years: 30.00  . Types: Cigarettes  . Quit date: 11/23/1985  . Years since quitting: 34.2  Smokeless Tobacco Never Used    Objective:  There were no vitals filed for this visit. Constitutional Patient is a pleasant 74 y.o. African American female morbidly obese in NAD. AAO x 3.  Vascular Capillary refill time to digits <4 seconds b/l lower extremities. Nonpalpable DP pulse(s) b/l lower extremities. Nonpalpable PT pulse(s) b/l lower extremities. Pedal hair absent. Lower extremity skin temperature gradient within normal limits. No pain with calf compression b/l. Nonpitting edema noted b/l lower extremities. No cyanosis or clubbing noted. Leg wraps b/l LE which are clean, dry and intact.  Neurologic Normal speech.  Protective sensation diminished with 10g monofilament b/l. Vibratory sensation diminished b/l.  Dermatologic No open wounds bilaterally. No interdigital macerations bilaterally. Toenails 1-5 b/l elongated, discolored, dystrophic, thickened, crumbly with subungual debris and tenderness to dorsal palpation. Exposed skin with normal turgor, texture and tone b/l LE.  Orthopedic: Normal muscle strength 5/5 to all lower extremity muscle groups bilaterally.   Hemoglobin A1C Latest Ref Rng & Units 02/02/2020  HGBA1C 4.6 - 6.5 % 6.8(H)  Some recent data might be hidden   Assessment:   1. Pain due to onychomycosis of toenails of both feet   2. Lymphedema   3. Diabetic peripheral neuropathy associated with type 2 diabetes mellitus ([place])    Plan:  Patient was evaluated and treated and all questions answered.  Onychomycosis with pain -Nails palliatively debridement as  below. -Educated on self-care  Procedure: Nail Debridement Rationale: Pain Type of Debridement: manual, sharp debridement. Instrumentation: Nail nipper, rotary burr. Number of Nails: 10  -Examined patient. -No new findings. No new orders. -Continue diabetic shoes daily. -Patient to continue soft, supportive shoe gear daily. -Toenails 1-5 b/l were debrided in length and girth with sterile nail nippers and dremel without iatrogenic bleeding.  -Patient to report any pedal injuries to medical professional immediately. -Patient/POA to call should there be question/concern in the interim.  Return in about 3 months (around 05/18/2020) for diabetic nail trim.  Marzetta Board, DPM

## 2020-02-16 NOTE — Telephone Encounter (Signed)
Called pt no answer LMOM RTC.../lmb 

## 2020-02-16 NOTE — Telephone Encounter (Signed)
Only alternative is to have her come in for ceftriaxone shots daily for 3 days in office. Unless her nausea is improved and she wants to try taking pills for UTI

## 2020-02-16 NOTE — Telephone Encounter (Signed)
Pt return call back gave her MD response. She states she went ahead and got the prescription.Marland KitchenJohny [name]

## 2020-02-16 NOTE — Telephone Encounter (Signed)
   Patient calling to report medication too costly ($93) What is the alternative? Patient at pharmacy now

## 2020-02-20 ENCOUNTER — Telehealth: Payer: Self-pay | Admitting: Internal Medicine

## 2020-02-20 NOTE — Telephone Encounter (Signed)
   Jaclyn from [facility] calling because she had her visit with the patient today and the patient is still not able to keep and food or drink down and she is frequently throwing up and during her visit she could barely talk because the nausea was so bad and she threw up once during her visit. She is concerned about this leading to a hospitalization.  [phone]

## 2020-02-20 NOTE — Telephone Encounter (Signed)
Did she end up taking the fosfomycin? If so when did she take it? We still do not have results of her urine culture yet.

## 2020-02-20 NOTE — Telephone Encounter (Signed)
Jaclyn from [facility] calling because she had her visit with the patient today and the patient is still not able to keep and food or drink down and she is frequently throwing up and during her visit she could barely talk because the nausea was so bad and she threw up once during her visit. She is concerned about this leading to a hospitalization.  [phone]      Documentation

## 2020-02-21 NOTE — Telephone Encounter (Addendum)
Verdene Lennert, RN called from [place] Phone: [phone]  The patient was evaluated by their Medicare Provider yesterday and is still experiencing vomiting  Patient's bladder infection is better, she did take the fosfomycin according but is still needing relief from the vomiting, patient stated it was no better than last week when she was seen.  After patient is followed up with, Verdene Lennert is asking for a call with care plan so she can finish up the Medicare documentation  RN did not believe patient needs to go to the ED

## 2020-02-22 ENCOUNTER — Encounter (HOSPITAL_BASED_OUTPATIENT_CLINIC_OR_DEPARTMENT_OTHER): Payer: PPO | Admitting: Internal Medicine

## 2020-02-22 ENCOUNTER — Other Ambulatory Visit: Payer: Self-pay

## 2020-02-22 DIAGNOSIS — L97811 Non-pressure chronic ulcer of other part of right lower leg limited to breakdown of skin: Secondary | ICD-10-CM | POA: Diagnosis not present

## 2020-02-22 DIAGNOSIS — L97821 Non-pressure chronic ulcer of other part of left lower leg limited to breakdown of skin: Secondary | ICD-10-CM | POA: Diagnosis not present

## 2020-02-22 DIAGNOSIS — E11622 Type 2 diabetes mellitus with other skin ulcer: Secondary | ICD-10-CM | POA: Diagnosis not present

## 2020-02-22 DIAGNOSIS — I890 Lymphedema, not elsewhere classified: Secondary | ICD-10-CM | POA: Diagnosis not present

## 2020-02-22 NOTE — Telephone Encounter (Signed)
I do not see the answers to my questions below. Also we still do not have results from urine samples from visit. Do we know if she actually left sample? If so we need to follow up as those results are very late.

## 2020-02-22 NOTE — Progress Notes (Signed)
ICIE, KUZNICKI ([id number]) Visit Report for 02/22/2020 Arrival Information Details Patient Name: Date of Service: [name] [name], [name] [name] 02/22/2020 9:15 [name] M Medical Record Number: [id number] Patient Account Number: [id number] Date of Birth/Sex: Treating RN: 1945-12-24 (74 y.o. Benjamine Sprague, Briant Cedar Primary Care Cayleigh Paull: [name] Other Clinician: Referring Aracelli Woloszyn: Treating Ismahan Lippman/Extender: [name] in Treatment: 1 Visit Information History Since Last Visit Added or deleted any medications: No Patient Arrived: Gilford Rile Any new allergies or adverse reactions: No Arrival Time: 09:23 Had [name] fall or experienced change in No Accompanied By: alone activities of daily living that may affect Transfer Assistance: None risk of falls: Patient Identification Verified: Yes Signs or symptoms of abuse/neglect since last visito No Secondary Verification Process Completed: Yes Hospitalized since last visit: No Patient Requires Transmission-Based Precautions: No Implantable device outside of the clinic excluding No Patient Has Alerts: Yes cellular tissue based products placed in the center Patient Alerts: R ABI= 1.21, L ABI= 1.35 since last visit: Has Dressing in Place as Prescribed: Yes Has Compression in Place as Prescribed: Yes Pain Present Now: No Electronic Signature(s) Signed: 02/22/2020 5:57:23 PM By: Levan Hurst RN, BSN Entered By: Levan Hurst on 02/22/2020 09:23:37 -------------------------------------------------------------------------------- Compression Therapy Details Patient Name: Date of Service: [name] [name], [name] [name] 02/22/2020 9:15 [name] M Medical Record Number: [id number] Patient Account Number: [id number] Date of Birth/Sex: Treating RN: Sep 12, 1945 (74 y.o. Debby Bud Primary Care Danny Yackley: [name] Other Clinician: Referring Yonael Tulloch: Treating Haani Bakula/Extender: [name] in Treatment:  1 Compression Therapy Performed for Wound Assessment: Wound #42 Left,Anterior Lower Leg Performed By: Clinician Baruch Gouty, RN Compression Type: Four Layer Post Procedure Diagnosis Same as Pre-procedure Electronic Signature(s) Signed: 02/22/2020 5:38:55 PM By: Deon Pilling Entered By: Deon Pilling on 02/22/2020 10:03:22 -------------------------------------------------------------------------------- Compression Therapy Details Patient Name: Date of Service: DOMINIQUE, RESSEL 02/22/2020 9:15 [name] M Medical Record Number: [id number] Patient Account Number: [id number] Date of Birth/Sex: Treating RN: 02/05/1946 (74 y.o. Debby Bud Primary Care Rylei Masella: [name] Other Clinician: Referring Yitzel Shasteen: Treating Remington Highbaugh/Extender: [name] in Treatment: 1 Compression Therapy Performed for Wound Assessment: Wound #43 Left,Lateral Lower Leg Performed By: Clinician Baruch Gouty, RN Compression Type: Four Layer Post Procedure Diagnosis Same as Pre-procedure Electronic Signature(s) Signed: 02/22/2020 5:38:55 PM By: Deon Pilling Entered By: Deon Pilling on 02/22/2020 10:03:23 -------------------------------------------------------------------------------- Compression Therapy Details Patient Name: Date of Service: [name] [name], [name] [name] 02/22/2020 9:15 [name] M Medical Record Number: [id number] Patient Account Number: [id number] Date of Birth/Sex: Treating RN: [birth year] (74 y.o. Debby Bud Primary Care Mayia Megill: [name] Other Clinician: Referring Demyah Smyre: Treating Erlean Mealor/Extender: [name] in Treatment: 1 Compression Therapy Performed for Wound Assessment: Wound #44 Right,Lateral Lower Leg Performed By: Clinician Baruch Gouty, RN Compression Type: Four Layer Post Procedure Diagnosis Same as Pre-procedure Electronic Signature(s) Signed: 02/22/2020 5:38:55 PM By: Deon Pilling Entered By: Deon Pilling on 02/22/2020 10:03:23 -------------------------------------------------------------------------------- Compression Therapy Details Patient Name: Date of Service: [name] [name], [name] [name] 02/22/2020 9:15 [name] M Medical Record Number: [id number] Patient Account Number: [id number] Date of Birth/Sex: Treating RN: 1945/12/29 (74 y.o. Debby Bud Primary Care Allyn Bertoni: [name] Other Clinician: Referring Donica Derouin: Treating Demya Scruggs/Extender: [name] in Treatment: 1 Compression Therapy Performed for Wound Assessment: Wound #45 Left,Distal,Lateral Lower Leg Performed By: Clinician Baruch Gouty, RN Compression Type: Four Layer Post Procedure Diagnosis Same as Pre-procedure Electronic Signature(s) Signed: 02/22/2020 5:38:55 PM By: Deon Pilling Entered By: Deon Pilling on 02/22/2020 10:03:23 --------------------------------------------------------------------------------  Compression Therapy Details Patient Name: Date of Service: [name] [name], [name] [name] 02/22/2020 9:15 [name] M Medical Record Number: [id number] Patient Account Number: [id number] Date of Birth/Sex: Treating RN: 05-13-1945 (74 y.o. Debby Bud Primary Care Ladarrian Asencio: [name] Other Clinician: Referring Kalana Yust: Treating Boe Deans/Extender: [name] in Treatment: 1 Compression Therapy Performed for Wound Assessment: Wound #46 Right,Distal,Lateral Lower Leg Performed By: Clinician Baruch Gouty, RN Compression Type: Four Layer Post Procedure Diagnosis Same as Pre-procedure Electronic Signature(s) Signed: 02/22/2020 5:38:55 PM By: Deon Pilling Entered By: Deon Pilling on 02/22/2020 10:03:23 -------------------------------------------------------------------------------- Encounter Discharge Information Details Patient Name: Date of Service: [name] [name], [name] [name] 02/22/2020 9:15 [name] M Medical Record  Number: [id number] Patient Account Number: [id number] Date of Birth/Sex: Treating RN: 09-09-1945 (74 y.o. Elam Dutch Primary Care Justn Quale: [name] Other Clinician: Referring Daeshawn Redmann: Treating Benz Vandenberghe/Extender: [name] in Treatment: 1 Encounter Discharge Information Items Discharge Condition: Stable Ambulatory Status: Walker Discharge Destination: Home Transportation: Other Accompanied By: self Schedule Follow-up Appointment: Yes Clinical Summary of Care: Patient Declined Notes SCAT Electronic Signature(s) Signed: 02/22/2020 2:58:18 PM By: Baruch Gouty RN, BSN Entered By: Baruch Gouty on 02/22/2020 10:19:48 -------------------------------------------------------------------------------- Lower Extremity Assessment Details Patient Name: Date of Service: [name] [name], [name] [name] 02/22/2020 9:15 [name] M Medical Record Number: [id number] Patient Account Number: [id number] Date of Birth/Sex: Treating RN: [birth year] (74 y.o. [name] Primary Care Elliot Meldrum: [name] Other Clinician: Referring Lyndi Holbein: Treating Hadessah Grennan/Extender: [name] in Treatment: 1 Edema Assessment Assessed: [Left: No] Patrice Paradise: No] Edema: [Left: Yes] [Right: Yes] Calf Left: Right: Point of Measurement: From Medial Instep 46.6 cm 45 cm Ankle Left: Right: Point of Measurement: From Medial Instep 28 cm 27 cm Vascular Assessment Pulses: Dorsalis Pedis Palpable: [Left:Yes] [Right:Yes] Electronic Signature(s) Signed: 02/22/2020 5:57:23 PM By: Levan Hurst RN, BSN Entered By: Levan Hurst on 02/22/2020 09:46:58 -------------------------------------------------------------------------------- Multi Wound Chart Details Patient Name: Date of Service: [place], [name] [name] 02/22/2020 9:15 [name] M Medical Record Number: [id number] Patient Account Number: [id number] Date of Birth/Sex: Treating RN: 10-01-1945 (74  y.o. Helene Shoe, Meta.Reding Primary Care Tyla Burgner: [name] Other Clinician: Referring Crissie Aloi: Treating Naila Elizondo/Extender: [name] in Treatment: 1 Vital Signs Height(in): 71 Pulse(bpm): 101 Weight(lbs): 260 Blood Pressure(mmHg): 130/78 Body Mass Index(BMI): 36 Temperature(F): 97.6 Respiratory Rate(breaths/min): 20 Photos: [42:No Photos Left, Anterior Lower Leg] [43:No Photos Left, Lateral Lower Leg] [44:No Photos Right, Lateral Lower Leg] Wound Location: [42:Gradually Appeared] [43:Gradually Appeared] [44:Gradually Appeared] Wounding Event: [42:Lymphedema] [43:Lymphedema] [44:Lymphedema] Primary Etiology: [42:Diabetic Wound/Ulcer of the Lower] [43:Diabetic Wound/Ulcer of the Lower] [44:Diabetic Wound/Ulcer of the Lower] Secondary Etiology: [42:Extremity Cataracts, Lymphedema, Arrhythmia,] [43:Extremity Cataracts, Lymphedema, Arrhythmia,] [44:Extremity Cataracts, Lymphedema, Arrhythmia,] Comorbid History: [42:Congestive Heart Failure, Hypertension, Peripheral Venous Disease, Type II Diabetes, Osteoarthritis, Neuropathy 12/06/2019] [43:Congestive Heart Failure, Hypertension, Peripheral Venous Disease, Type II Diabetes, Osteoarthritis,  Neuropathy 12/06/2019] [44:Congestive Heart Failure, Hypertension, Peripheral Venous Disease, Type II Diabetes, Osteoarthritis, Neuropathy 12/06/2019] Date Acquired: [42:1] [43:1] [44:1] Weeks of Treatment: [42:Open] [43:Open] [44:Open] Wound Status: [42:No] [43:No] [44:Yes] Clustered Wound: [42:N/[name]] [43:N/[name]] [44:3] Clustered Quantity: [42:2.1x3.2x0.1] [43:1.3x3.2x0.1] [44:1.5x3.2x0.1] Measurements L x W x D (cm) [42:5.278] [43:3.267] [44:3.77] [name] (cm) : rea [42:0.528] [43:0.327] [44:0.377] Volume (cm) : [42:29.30%] [43:53.80%] [44:65.70%] % Reduction in Area: [42:29.20%] [43:53.70%] [44:65.70%] % Reduction in Volume: [42:Full Thickness Without Exposed] [43:Full Thickness Without Exposed] [44:Full Thickness Without  Exposed] Classification: [42:Support Structures Medium] [43:Support Structures Medium] [44:Support Structures Medium] Exudate Amount: [42:Serosanguineous] [43:Serosanguineous] [44:Serosanguineous] Exudate Type: [42:red, brown] [43:red, brown] [44:red, brown]  Exudate Color: [42:Flat and Intact] [43:Flat and Intact] [44:Flat and Intact] Wound Margin: [42:Large (67-100%)] [43:Large (67-100%)] [44:Large (67-100%)] Granulation Amount: [42:Red, Pink] [43:Pink] [44:Red] Granulation Quality: [42:None Present (0%)] [43:Small (1-33%)] [44:None Present (0%)] Necrotic Amount: [42:Fat Layer (Subcutaneous Tissue): Yes Fat Layer (Subcutaneous Tissue): Yes Fat Layer (Subcutaneous Tissue): Yes] Exposed Structures: [42:Fascia: No Tendon: No Muscle: No Joint: No Bone: No Small (1-33%)] [43:Fascia: No Tendon: No Muscle: No Joint: No Bone: No Small (1-33%)] [44:Fascia: No Tendon: No Muscle: No Joint: No Bone: No Medium (34-66%)] Epithelialization: [42:Compression Therapy] [43:Compression Therapy] [44:Compression Therapy] Wound Number: [id number] N/[name] Photos: No Photos No Photos N/[name] Left, Distal, Lateral Lower Leg Right, Distal, Lateral Lower Leg N/[name] Wound Location: Gradually Appeared Gradually Appeared N/[name] Wounding Event: Lymphedema Lymphedema N/[name] Primary Etiology: Diabetic Wound/Ulcer of the Lower Diabetic Wound/Ulcer of the Lower N/[name] Secondary Etiology: Extremity Extremity Cataracts, Lymphedema, Arrhythmia, Cataracts, Lymphedema, Arrhythmia, N/[name] Comorbid History: Congestive Heart Failure, Congestive Heart Failure, Hypertension, Peripheral Venous Hypertension, Peripheral Venous Disease, Type II Diabetes, Disease, Type II Diabetes, Osteoarthritis, Neuropathy Osteoarthritis, Neuropathy 12/06/2019 12/06/2019 N/[name] Date Acquired: 1 1 N/[name] Weeks of Treatment: Open Open N/[name] Wound Status: No No N/[name] Clustered Wound: N/[name] N/[name] N/[name] Clustered Quantity: 3.6x4.5x0.1 1.2x1x0.1 N/[name] Measurements L x W x D (cm) 12.723 0.942  N/[name] [name] (cm) : rea 1.272 0.094 N/[name] Volume (cm) : 19.60% -55.70% N/[name] % Reduction in Area: 19.60% -56.70% N/[name] % Reduction in Volume: Full Thickness Without Exposed Full Thickness Without Exposed N/[name] Classification: Support Structures Support Structures Medium Small N/[name] Exudate Amount: Serosanguineous Serosanguineous N/[name] Exudate Type: red, brown red, brown N/[name] Exudate Color: Flat and Intact Flat and Intact N/[name] Wound Margin: Large (67-100%) Large (67-100%) N/[name] Granulation Amount: Pink Pink N/[name] Granulation Quality: Small (1-33%) None Present (0%) N/[name] Necrotic Amount: Fat Layer (Subcutaneous Tissue): Yes Fat Layer (Subcutaneous Tissue): Yes N/[name] Exposed Structures: Fascia: No Fascia: No Tendon: No Tendon: No Muscle: No Muscle: No Joint: No Joint: No Bone: No Bone: No Small (1-33%) Small (1-33%) N/[name] Epithelialization: Compression Therapy Compression Therapy N/[name] Procedures Performed: Treatment Notes Wound #42 (Left, Anterior Lower Leg) 2. Periwound Care Moisturizing lotion TCA Cream 3. Primary Dressing Applied Calcium Alginate Ag 4. Secondary Dressing Dry Gauze 6. Support Layer Applied 4 layer compression wrap Wound #43 (Left, Lateral Lower Leg) 2. Periwound Care Moisturizing lotion TCA Cream 3. Primary Dressing Applied Calcium Alginate Ag 4. Secondary Dressing Dry Gauze 6. Support Layer Applied 4 layer compression wrap Wound #44 (Right, Lateral Lower Leg) 2. Periwound Care Moisturizing lotion TCA Cream 3. Primary Dressing Applied Calcium Alginate Ag 4. Secondary Dressing Dry Gauze 6. Support Layer Applied 4 layer compression wrap Wound #45 (Left, Distal, Lateral Lower Leg) 2. Periwound Care Moisturizing lotion TCA Cream 3. Primary Dressing Applied Calcium Alginate Ag 4. Secondary Dressing Dry Gauze 6. Support Layer Applied 4 layer compression wrap Wound #46 (Right, Distal, Lateral Lower Leg) 2. Periwound Care Moisturizing lotion TCA  Cream 3. Primary Dressing Applied Calcium Alginate Ag 4. Secondary Dressing Dry Gauze 6. Support Layer Applied 4 layer compression Water quality scientist) Signed: 02/22/2020 5:14:03 PM By: Linton Ham MD Signed: 02/22/2020 5:38:55 PM By: Deon Pilling Entered By: Linton Ham on 02/22/2020 10:24:15 -------------------------------------------------------------------------------- Multi-Disciplinary Care Plan Details Patient Name: Date of Service: [name] [name], [name] [name] 02/22/2020 9:15 [name] M Medical Record Number: [id number] Patient Account Number: [id number] Date of Birth/Sex: Treating RN: 1945/08/31 (74 y.o. Debby Bud Primary Care Aroldo Galli: [name] Other Clinician: Referring Terrion Poblano: Treating Jevonte Clanton/Extender: [name] in Treatment:  1 Active Inactive Wound/Skin Impairment Nursing Diagnoses: Knowledge deficit related to ulceration/compromised skin integrity Goals: Ulcer/skin breakdown will have [name] volume reduction of 30% by week 4 Date Initiated: 02/13/2020 Target Resolution Date: 03/14/2020 Goal Status: Active Ulcer/skin breakdown will have [name] volume reduction of 50% by week 8 Date Initiated: 02/13/2020 Target Resolution Date: 03/14/2020 Goal Status: Active Interventions: Assess patient/caregiver ability to obtain necessary supplies Assess patient/caregiver ability to perform ulcer/skin care regimen upon admission and as needed Assess ulceration(s) every visit Notes: Electronic Signature(s) Signed: 02/22/2020 5:38:55 PM By: Deon Pilling Entered By: Deon Pilling on 02/22/2020 09:45:13 -------------------------------------------------------------------------------- Pain Assessment Details Patient Name: Date of Service: MARNAE, MADANI 02/22/2020 9:15 [name] M Medical Record Number: [id number] Patient Account Number: [id number] Date of Birth/Sex: Treating RN: 30-Apr-1945 (74 y.o. [name] Primary Care  Jyren Cerasoli: [name] Other Clinician: Referring Augustine Brannick: Treating Chukwuebuka Churchill/Extender: [name] in Treatment: 1 Active Problems Location of Pain Severity and Description of Pain Patient Has Paino No Site Locations Pain Management and Medication Current Pain Management: Electronic Signature(s) Signed: 02/22/2020 5:57:23 PM By: Levan Hurst RN, BSN Entered By: Levan Hurst on 02/22/2020 09:27:09 -------------------------------------------------------------------------------- Patient/Caregiver Education Details Patient Name: Date of Service: [name] [name], [name] [name] 11/18/2021andnbsp9:15 [name] M Medical Record Number: [id number] Patient Account Number: [id number] Date of Birth/Gender: Treating RN: September 19, 1945 (74 y.o. Debby Bud Primary Care Physician: [name] Other Clinician: Referring Physician: Treating Physician/Extender: [name] in Treatment: 1 Education Assessment Education Provided To: Patient Education Topics Provided Wound/Skin Impairment: Handouts: Skin Care Do's and Dont's Methods: Explain/Verbal Responses: Reinforcements needed Electronic Signature(s) Signed: 02/22/2020 5:38:55 PM By: Deon Pilling Entered By: Deon Pilling on 02/22/2020 09:45:24 -------------------------------------------------------------------------------- Wound Assessment Details Patient Name: Date of Service: [name] [name], [name] [name] 02/22/2020 9:15 [name] M Medical Record Number: [id number] Patient Account Number: [id number] Date of Birth/Sex: Treating RN: 1945-08-18 (74 y.o. [name] Primary Care Willliam Pettet: [name] Other Clinician: Referring Kobe Ofallon: Treating Garrick Midgley/Extender: [name] in Treatment: 1 Wound Status Wound Number: 42 Primary Lymphedema Etiology: Wound Location: Left, Anterior Lower Leg Secondary Diabetic Wound/Ulcer of the Lower  Extremity Wounding Event: Gradually Appeared Etiology: Date Acquired: 12/06/2019 Wound Open Weeks Of Treatment: 1 Status: Clustered Wound: No Comorbid Cataracts, Lymphedema, Arrhythmia, Congestive Heart Failure, History: Hypertension, Peripheral Venous Disease, Type II Diabetes, Osteoarthritis, Neuropathy Wound Measurements Length: (cm) 2.1 Width: (cm) 3.2 Depth: (cm) 0.1 Area: (cm) 5.278 Volume: (cm) 0.528 % Reduction in Area: 29.3% % Reduction in Volume: 29.2% Epithelialization: Small (1-33%) Tunneling: No Undermining: No Wound Description Classification: Full Thickness Without Exposed Support Structures Wound Margin: Flat and Intact Exudate Amount: Medium Exudate Type: Serosanguineous Exudate Color: red, brown Foul Odor After Cleansing: No Slough/Fibrino Yes Wound Bed Granulation Amount: Large (67-100%) Exposed Structure Granulation Quality: Red, Pink Fascia Exposed: No Necrotic Amount: None Present (0%) Fat Layer (Subcutaneous Tissue) Exposed: Yes Tendon Exposed: No Muscle Exposed: No Joint Exposed: No Bone Exposed: No Treatment Notes Wound #42 (Left, Anterior Lower Leg) 2. Periwound Care Moisturizing lotion TCA Cream 3. Primary Dressing Applied Calcium Alginate Ag 4. Secondary Dressing Dry Gauze 6. Support Layer Applied 4 layer compression Water quality scientist) Signed: 02/22/2020 5:57:23 PM By: Levan Hurst RN, BSN Entered By: Levan Hurst on 02/22/2020 09:48:18 -------------------------------------------------------------------------------- Wound Assessment Details Patient Name: Date of Service: [name] [name], [name] [name] 02/22/2020 9:15 [name] M Medical Record Number: [id number] Patient Account Number: [id number] Date of Birth/Sex: Treating RN: 1945-05-03 (74 y.o. [name] Primary Care Kavita Bartl: [name] Other  Clinician: Referring Kenai Fluegel: Treating Falyn Rubel/Extender: [name] in Treatment:  1 Wound Status Wound Number: 43 Primary Lymphedema Etiology: Wound Location: Left, Lateral Lower Leg Secondary Diabetic Wound/Ulcer of the Lower Extremity Wounding Event: Gradually Appeared Etiology: Date Acquired: 12/06/2019 Wound Open Weeks Of Treatment: 1 Status: Clustered Wound: No Comorbid Cataracts, Lymphedema, Arrhythmia, Congestive Heart Failure, History: Hypertension, Peripheral Venous Disease, Type II Diabetes, Osteoarthritis, Neuropathy Wound Measurements Length: (cm) 1.3 Width: (cm) 3.2 Depth: (cm) 0.1 Area: (cm) 3.267 Volume: (cm) 0.327 % Reduction in Area: 53.8% % Reduction in Volume: 53.7% Epithelialization: Small (1-33%) Tunneling: No Undermining: No Wound Description Classification: Full Thickness Without Exposed Support Structures Wound Margin: Flat and Intact Exudate Amount: Medium Exudate Type: Serosanguineous Exudate Color: red, brown Wound Bed Granulation Amount: Large (67-100%) Granulation Quality: Pink Necrotic Amount: Small (1-33%) Necrotic Quality: Adherent Slough Foul Odor After Cleansing: No Slough/Fibrino Yes Exposed Structure Fascia Exposed: No Fat Layer (Subcutaneous Tissue) Exposed: Yes Tendon Exposed: No Muscle Exposed: No Joint Exposed: No Bone Exposed: No Treatment Notes Wound #43 (Left, Lateral Lower Leg) 2. Periwound Care Moisturizing lotion TCA Cream 3. Primary Dressing Applied Calcium Alginate Ag 4. Secondary Dressing Dry Gauze 6. Support Layer Applied 4 layer compression Water quality scientist) Signed: 02/22/2020 5:57:23 PM By: Levan Hurst RN, BSN Entered By: Levan Hurst on 02/22/2020 09:48:35 -------------------------------------------------------------------------------- Wound Assessment Details Patient Name: Date of Service: Bord, [name] [name] 02/22/2020 9:15 [name] M Medical Record Number: [id number] Patient Account Number: [id number] Date of Birth/Sex: Treating RN: [birth year] (74 y.o. [name] Primary Care Moneka Mcquinn: [name] Other Clinician: Referring Pate Aylward: Treating Leon Montoya/Extender: [name] in Treatment: 1 Wound Status Wound Number: 44 Primary Lymphedema Etiology: Wound Location: Right, Lateral Lower Leg Secondary Diabetic Wound/Ulcer of the Lower Extremity Wounding Event: Gradually Appeared Etiology: Date Acquired: 12/06/2019 Wound Open Weeks Of Treatment: 1 Status: Clustered Wound: Yes Comorbid Cataracts, Lymphedema, Arrhythmia, Congestive Heart Failure, History: Hypertension, Peripheral Venous Disease, Type II Diabetes, Osteoarthritis, Neuropathy Wound Measurements Length: (cm) 1.5 Width: (cm) 3.2 Depth: (cm) 0.1 Clustered Quantity: 3 Area: (cm) 3.77 Volume: (cm) 0.377 % Reduction in Area: 65.7% % Reduction in Volume: 65.7% Epithelialization: Medium (34-66%) Tunneling: No Undermining: No Wound Description Classification: Full Thickness Without Exposed Support Structures Wound Margin: Flat and Intact Exudate Amount: Medium Exudate Type: Serosanguineous Exudate Color: red, brown Foul Odor After Cleansing: No Slough/Fibrino No Wound Bed Granulation Amount: Large (67-100%) Exposed Structure Granulation Quality: Red Fascia Exposed: No Necrotic Amount: None Present (0%) Fat Layer (Subcutaneous Tissue) Exposed: Yes Tendon Exposed: No Muscle Exposed: No Joint Exposed: No Bone Exposed: No Treatment Notes Wound #44 (Right, Lateral Lower Leg) 2. Periwound Care Moisturizing lotion TCA Cream 3. Primary Dressing Applied Calcium Alginate Ag 4. Secondary Dressing Dry Gauze 6. Support Layer Applied 4 layer compression Water quality scientist) Signed: 02/22/2020 5:57:23 PM By: Levan Hurst RN, BSN Entered By: Levan Hurst on 02/22/2020 09:48:49 -------------------------------------------------------------------------------- Wound Assessment Details Patient Name: Date of Service: Boller,  [name] [name] 02/22/2020 9:15 [name] M Medical Record Number: [id number] Patient Account Number: [id number] Date of Birth/Sex: Treating RN: 03/19/1946 (74 y.o. [name] Primary Care Berdell Hostetler: [name] Other Clinician: Referring Kemari Mares: Treating Jovanie Verge/Extender: [name] in Treatment: 1 Wound Status Wound Number: 45 Primary Lymphedema Etiology: Wound Location: Left, Distal, Lateral Lower Leg Secondary Diabetic Wound/Ulcer of the Lower Extremity Wounding Event: Gradually Appeared Etiology: Date Acquired: 12/06/2019 Wound Open Weeks Of Treatment: 1 Status: Clustered Wound: No Comorbid Cataracts, Lymphedema, Arrhythmia, Congestive Heart  Failure, History: Hypertension, Peripheral Venous Disease, Type II Diabetes, Osteoarthritis, Neuropathy Wound Measurements Length: (cm) 3.6 Width: (cm) 4.5 Depth: (cm) 0.1 Area: (cm) 12.723 Volume: (cm) 1.272 % Reduction in Area: 19.6% % Reduction in Volume: 19.6% Epithelialization: Small (1-33%) Tunneling: No Undermining: No Wound Description Classification: Full Thickness Without Exposed Support Structures Wound Margin: Flat and Intact Exudate Amount: Medium Exudate Type: Serosanguineous Exudate Color: red, brown Foul Odor After Cleansing: No Slough/Fibrino Yes Wound Bed Granulation Amount: Large (67-100%) Exposed Structure Granulation Quality: Pink Fascia Exposed: No Necrotic Amount: Small (1-33%) Fat Layer (Subcutaneous Tissue) Exposed: Yes Necrotic Quality: Adherent Slough Tendon Exposed: No Muscle Exposed: No Joint Exposed: No Bone Exposed: No Treatment Notes Wound #45 (Left, Distal, Lateral Lower Leg) 2. Periwound Care Moisturizing lotion TCA Cream 3. Primary Dressing Applied Calcium Alginate Ag 4. Secondary Dressing Dry Gauze 6. Support Layer Applied 4 layer compression Water quality scientist) Signed: 02/22/2020 5:57:23 PM By: Levan Hurst RN, BSN Entered  By: Levan Hurst on 02/22/2020 09:49:02 -------------------------------------------------------------------------------- Wound Assessment Details Patient Name: Date of Service: Kondracki, [name] [name] 02/22/2020 9:15 [name] M Medical Record Number: [id number] Patient Account Number: [id number] Date of Birth/Sex: Treating RN: 01-Jan-1946 (74 y.o. 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 Event: Gradually Appeared Etiology: Date Acquired: 12/06/2019 Wound Open Weeks Of Treatment: 1 Status: Clustered Wound: No Comorbid Cataracts, Lymphedema, Arrhythmia, Congestive Heart Failure, History: Hypertension, Peripheral Venous Disease, Type II Diabetes, Osteoarthritis, Neuropathy Wound Measurements Length: (cm) 1.2 Width: (cm) 1 Depth: (cm) 0.1 Area: (cm) 0.942 Volume: (cm) 0.094 % Reduction in Area: -55.7% % Reduction in Volume: -56.7% Epithelialization: Small (1-33%) Tunneling: No Undermining: No Wound Description Classification: Full Thickness Without Exposed Support Structures Wound Margin: Flat and Intact Exudate Amount: Small Exudate Type: Serosanguineous Exudate Color: red, brown Foul Odor After Cleansing: No Slough/Fibrino No Wound Bed Granulation Amount: Large (67-100%) Exposed Structure Granulation Quality: Pink Fascia Exposed: No Necrotic Amount: None Present (0%) Fat Layer (Subcutaneous Tissue) Exposed: Yes Tendon Exposed: No Muscle Exposed: No Joint Exposed: No Bone Exposed: No Treatment Notes Wound #46 (Right, Distal, Lateral Lower Leg) 2. Periwound Care Moisturizing lotion TCA Cream 3. Primary Dressing Applied Calcium Alginate Ag 4. Secondary  Dressing Dry Gauze 6. Support Layer Applied 4 layer compression Water quality scientist) Signed: 02/22/2020 5:57:23 PM By: Levan Hurst RN, BSN Entered By: Levan Hurst on 02/22/2020 09:49:15 -------------------------------------------------------------------------------- Vitals Details Patient Name: Date of Service: [place], [name] [name] 02/22/2020 9:15 [name] M Medical Record Number: [id number] Patient Account Number: [id number] Date of Birth/Sex: Treating RN: 01/13/1946 (74 y.o. [name] Primary Care Shalita Notte: [name] Other Clinician: Referring Kassia Demarinis: Treating Mae Cianci/Extender: [name] in Treatment: 1 Vital Signs Time Taken: 09:26 Temperature (F): 97.6 Height (in): 71 Pulse (bpm): 101 Weight (lbs): 260 Respiratory Rate (breaths/min): 20 Body Mass Index (BMI): 36.3 Blood Pressure (mmHg): 130/78 Reference Range: 80 - 120 mg / dl Electronic Signature(s) Signed: 02/22/2020 5:57:23 PM By: Levan Hurst RN, BSN Entered By: Levan Hurst on 02/22/2020 09:27:02

## 2020-02-22 NOTE — Progress Notes (Signed)
LAMAE, FOSCO ([id number]) Visit Report for 02/22/2020 HPI Details Patient Name: Date of Service: ONEY, TATLOCK 02/22/2020 9:15 A M Medical Record Number: [id number] Patient Account Number: [id number] Date of Birth/Sex: Treating RN: Apr 23, 1945 (74 y.o. Debby Bud Primary Care Provider: [name] Other Clinician: Referring Provider: Treating Provider/Extender: [name] in Treatment: 1 History of Present Illness HPI Description: 04/09/15; the patient is here for review of wounds on her bilateral lower extremities for the last month. The patient has a history of lymphedema and bilateral chronic venous insufficiency with inflammation she was here for review of wounds on her bilateral legs in 2014 she was discharged home with external compression pumps which she is currently not using. Over the last month she developed wounds on her bilateral lower legs with drainage and pain and she is here for our review of this. 04/16/15; the patient's edema is under much better control. She has 3 areas one on the right anterior medial leg one on the left posterior leg and a small open area with purulent drainage on the left anterior leg. I have cultured the area on the left anterior leg 04/23/15; continued improvement in the patient's edema. All her wounds of closed I see no open areas. Culture from the left anterior leg last time was negative. READMISSION 02/07/16; this patient is a woman that I saw her earlier in 2017. I believe we discharged her very quickly with bilateral wounds in her legs secondary to chronic venous hypertension with inflammation and secondary lymphedema. She had juxtalite stockings. She has external compression pumps at home from a stay in our clinic in 2014. She tells me that she's had wounds on her bilateral lower legs since March or April of this year. 3 wounds on the left 2 on the right. She is a type II diabetic and has a history  of noncompressible vessels bilaterally although she is tolerated 4-layer compression. The last note from her primary physician Dr. [name] was in May. At that point Dr. [name] had referred the patient here for lymphedema management. The patient stated she made a phone call to year but was not able to arrange an appointment. It is not clear that she is actually using anything on the wounds currently except some gauze that was saturated with drainage per our intake nurse. She is certainly not using her juxtalite stockings and by the patient's own admission she is not using her compression pumps because they are "irritating". I don't see a recent hemoglobin A1c. The patient states she has not been systemically unwell but the legs are painful 02/14/16; patient tolerated her Profore wraps reasonably well. She is using the compression pumps 1 time per day. Measurements of her legs are better in terms of the amount of lymphedema 02/21/16; patient tolerated her Profore wraps it. She says she is using her compression pumps one or 2 times a day. Measurements of her leg circumference are improved and most of her wound dimensions are better, unfortunately the home health that we referred her to did not except her insurance but works successful in getting her accompanied that would except her insurance however she apparently has a $25 co-pay which she cannot afford as she is on a fixed income 03/05/16; patient states she did not back using the pumps. She is healed that 2 wounds on the left leg. She still has areas on the right lateral lower leg, right medial lower leg and left lateral lower leg. All of these  look some better and have reduced in area 03/13/16; still 3 wounds 1 on the left lateral leg, one on the left medial/posterior leg which is really the most extensive and a small area on the right lateral leg. All of this in the setting of severe chronic lymphedema and chronic venous inflammation.  Damage to the skin with cobblestone thickened skin. 03/20/16; still 2 areas. The area on the left lateral leg appears to of closed over. The right medial leg is still most extensive wound although it appears to be closing over right lateral only a small open area remains. Using silver alginate Profore wraps 03/27/16; the area on the left leg remains closed. The edema control is better on the left than the right. The right medial leg is still weeping but everything appears to be epithelialized. The lateral aspect of the right leg appears closed. She tells me she has old juxtalite stockings. She has been compliant with her compression pumps. 04/03/16; the area on the left leg remains closed. She has ordered new juxtalite stockings. The right posterior medial wound is fully epithelialized. However she has an irritated area over the right Achilles that she states is been itching all week under the wraps.. She has been compliant with her compression pumps 04/10/16; the area on the left leg remains closed. The right posterior medial wound is also closed today. The irritated area over the right Achilles which was probably wrap injury is also fully epithelialized and closed. The patient has not received her new juxtalite stockings, she arrives in clinic today with an old juxtalite stocking in place. Sheis using her compression pumps secondary to her severe stage III lymphedema READMISSION 01/21/17; this is a patient we know from several previous stays in this clinic. She has lymphedema with chronic venous insufficiency and inflammation. The last time she was here we discharged her with bilateral juxta lites which she is compliant with. She also has external compression pumps at home from stays in our clinic in 2014 although she tells me that she is not very compliant with these as when she uses them a causes pain in her bilateral knees the patient states that her wounds on her left greater than right leg  including 2 large areas on the left anterior and left lateral leg and an area on the right leg open in late June or July since then she is been followed by [facility] and she's had bilateral Unna boot supplied every week for the last month.. I think the wounds have actually improved although they are not specifically dressed. She states she had an ultrasound to rule out DVT bilaterally that was negative. She does not have arterial studies although she is a diabetic. She states she has a lot of pain in her legs she states she does not use her external compression pumps because it causes pain in her knees. As usual her ABIs were noncompressible bilaterally in this clinic 01/28/17; currently the patient only has one small wound on the left lateral leg and I think this should be healed next week. We are going to try to get her bilateral extremitease stockings. She tells me that compression pumps heard her posterior rib arthritic knees so she is not been compliant with this 02/04/17; the patient's wounds are totally healed. She has chronic venous insufficiency and secondary lymphedema. She has new extremitease stockings. She also has compression pumps Readmission: 04/21/17 on evaluation today patient appears to be doing about the same as what she was  previous when we were evaluating her back in November 2018. At that point she had completely healed. With that being said she has now reopened and states that she wasn't aware that she was supposed to come back. 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 Subsequently she also states that although she has the compression that we got for her previously the extremitease stockings she nonetheless doesn't always wear these and being noncompliant often has things will reopen as far as ulcers on her lower extremities. No fevers, chills, nausea, or vomiting  noted at this time. Patient has no evidence of dementia. She is having some discomfort in regard to the ulcers at this point. 04/28/17 on evaluation today patient appears to be doing significantly better in regard to her wounds. With that being said she is still having significant discomfort and is not really keen on sharp debridement. She does not appear to have any evidence of infection which is good news I do think the Iodoflex is beneficial for her. Of note her ABI's were found in the system to be on the right 1.21 with a TBI of 0.76 and on the left 1.35 with a TBI of 0.75. In general her swelling appears to be significantly improved however. No fevers, chills, nausea, or vomiting noted at this time. Patient has been tolerating the wraps without complication. She has no evidence of dementia. 05/05/17-she is here in follow-up evaluation for bilateral lower extremity venous and lymphedema ulcers. She states she has not been using her lymphedema pumps secondary to pain at her knees. After discussion it was noted that she uses knee-high lymphedema pumps but does have a pair of thigh-high lymphedema pumps. She was strongly advised to use the thigh-high lymphedema pumps and if necessary reduce the setting for improved tolerance. She reports no change in odor and/or drainage. She continues to tolerate sharp debridement poorly with significant pain, primarily to the right lower extremity. We will continue with current treatment plan, along with improved lymphedema pump use and follow-up next week 05/12/17 on evaluation today patient actually appears to be doing much better in regard to her bilateral lower extremity ulcers. Only the right altar actually requires debridement today. The other two cleaned off nicely in two of the three in fact on the left lower extremity or actually almost completely healed. She does have some weeping noted still the bilateral lower extremities left greater than right. She still  is not able to use her compression pumps even though I have mentioned this several times to her as did Leah last week. No fevers, chills, nausea, or vomiting noted at this time. Patient has been tolerating the dressing changes without complication. Patient has no evidence of dementia 05/19/17 on evaluation today patient appears to be doing very well regard to her bilateral lower extremity ulcers. In fact she seems to be improving and in fact healed in a couple of locations that were giving her trouble last week. Overall I'm extremely pleased with how things have progressed. She still has some discomfort especially in the right lateral lower extremity. Fortunately there does not appear to be any evidence of significant infection which is good news she does tell me that she has used her compression pumps several times although not routinely over the past week I did praise her for this. I do think it will be beneficial and that might even be what's helping  with her wounds as far as healing is concerned at least to a degree. No fevers, chills, nausea, or vomiting noted at this time. Patient has no dementia and no evidence of infection. 05/26/17 on evaluation today patient presents for evaluation concerning her bilateral lower extremity ulcer secondary to lymphedema. Fortunately she appears to be doing very well at this point in time. Specifically her right leg appears to be completely closed and healed. The left leg though there is still a small area of opening overall has healed. She does have a little bit of excoriation at the top or the wraps are because they slid down on her. Otherwise there's no evidence of infection and the other has improved dramatically now that she is not draining as significantly. Patient is having no significant pain she tells me she has been able to use her lymphedema pumps one time a day although that's not all she can tolerate. Even that is difficult for her. 06/02/17 on  evaluation today patient's ulcers appear to be completely closed at this point. She does still note some odor but I believe this is due to the fact she has not been able to wash her legs as well currently. Fortunately she did bring her compression with her today. She does have the EXTREMIT-EASE Compression. With that being said she is somewhat nervous about going back to this as previously she broke out yet again once we discontinue wrapping her. READMISSION 11/23/17; patient is now 74 year old diabetic woman with severe chronic venous insufficiency with lymphedema. We care for her last in this clinic in February 2019 at which time she had chronic venous insufficiency with lymphedema bilateral lower extremity ulcers. We discharged her with bilateral wrap around/extremitease stockings and compression pumps. Is fairly clear she is not using the compression pumps stating it causes pain in her knees. In any case she was admitted to hospital from 8/3 through 8/6 with a several week history of recurrent bilateral ulcers. She was felt to have bilateral cellulitis treated with doxycycline IV. She is still on oral doxycycline and has another 2 days worth. At discharge her white count was 14.1 hemoglobin at 8.9. The patient is a diabetic however her last ABIs in 2018 showed a right ABI of 1.21, left of 1.35. TBI's were 0.76 on the right 0.75 on the left. Posterior tibial artery was monophasic on the right triphasic on the left. Dorsalis pedis triphasic on the right and triphasic on the left. She is not felt to have significant macrovascular disease 12/02/17;significant bilateral lower extremity wounds secondary to severe uncontrolled lymphedema. She did not get home health out to change the wraps. So she kept this on all week. She is telling me she using his her compression pumps once a week. In general all the wounds look better. We've been using silver alginate with secondary absorptive dressings 4 layer  compression 12/10/17; bilateral lower extremity wounds secondary to uncontrolled lymphedema. She tells me she is using her compression pumps on most days. We've been keeping wraps on all week. Using silver alginate. She has wounds on the right lateral calf left medial and left lateral calf. All of this is somewhat better 12/16/17; the patient has 3 small open areas. One on the right lateral calf, one on the left medial calf and the small area on the left lateral calf. She has lymphedema, chronic venous insufficiency with hemosiderin deposition. Very fibrotic distal lower extremity scan with suggestion of an inverted bottle sign appearance to her legs. She has  compression pumps but she does not use them has not used them in the last week. Currently we have her in 4 layer compression, silver alginate to the wounds and this is being changed once by home health. She is noncompliant with the compression pumps because she says it hurts her knees. She also has compression stockings ando Juxta light stockings at home 12/24/17; the patient's right lateral calf is healed as well as the left medial. There is no open wound on the right leg. She still has 2 areas on the left lateral calf. We transitioned the right leg into an extremitease stocking which she brought in from home. 12/31/17; the patient has a superficial wound on the left lateral lower calf. We've been wrapping this leg. She arrives today with the right leg swollen but without a wound. She cannot get her extremities stocking on the right leg. There is a lot more edema here. She is not using her compression pumps 01/10/2018; patient has a same superficial wound on the left lateral calf. Her right leg has less peripheral edema. She tells me she also started herself on some Lasix that had been previously prescribed for her at some point but she had never taken it. She is also concerned about whether her compression pumps are leaking. She has extremitease  stockings however the edema gets bad enough she can get these on. Currently she has no open wound on the right leg and is mention the edema is actually better than when I saw this 10 days ago 01/17/2018; patient has a same superficial area on the posterior left calf and a weeping area in the mid part of the right lateral calf. She still does not have anyone from [facility] coming to see her compression pumps which she describes as being suspicious for a leakage. I am not really sure how frequently she is using this in any case. She has massive bilateral calf lymphedema. I think there is some spreading lymphedema into her posterior thighs. 01/24/2018; the patient's edema in her legs is actually some better. She still has a smaller area on the left posterior calf although this is better. The weeping area on the right lateral calf is also better. As it turned out she did not get her pumps from [facility] but [facility] and they are going to try to go out and see these this week which is going to help. I explained to her that she will use the compression pumps once a day while we are wrapping her legs but when she transitions to stockings that may be she will require pumping twice a day. Her compliance in this area has not been high and I wonder about her ability to do this herself. 02/01/2018; the area on the posterior left leg is healed. She still has the original wound on the right lateral calf which is epithelialized. The however just above this there is still weeping lymphedema fluid. She has a new area on the upper right calf which is either wrap injury or is she points out where she has been scratching under the wraps. We still not have managed to get a hold of a representative of [facility] to see if the pump is operational. She has extremitease stockings at home however I am not of the opinion that this will maintain her skin integrity without the compression  pumps. 02/08/2018; posterior left leg remains healed although she has a new small open area on the left lateral calf. Right  lateral calf has two quarter sized open areas and close juxtaposition. There is less weeping edema fluid. She apparently managed to contact [facility]. They are sending her a part. But she is still not received it 02/15/2018; the patient has no open wound on either leg. Some scale present on the right upper lateral calf just below the fibular head. Edema control is excellent. She did not bring her stockings or her juxta lite wrap around. She has the part for her compression pump but she has not use the compression pumps yet READMISSION 01/10/2019 Patient is now a 74 year old woman that we have had in the clinic multiple times in the past. She has chronic lower extremity lymphedema, recurrent wounds on her bilateral lower extremities. She is also a type II diabetic reasonably well controlled with a recent hemoglobin A1c while she was in the hospital of 5.9. She has wounds on her bilateral lower extremities mostly on the left lateral and right lateral. These were noted also during her recent hospitalization. The patient states they have been there for a while. She has not been able to wear her stockings he has trouble getting them on as she lives alone. She also has external compression pumps but uses them sparingly because it causes pain in her knees which hurt because of osteoarthritis. The patient was on doxycycline before she went into the hospital apparently for fear of infection in her legs and is on Keflex coming out because of a UTI The patient was recently in the hospital from 01/06/2019 through 01/09/2019. She was admitted with nausea and vomiting and prerenal azotemia. This was corrected with IV fluid her Lasix has been put on hold. Hemoglobin A1c at 5.9 her metformin was discontinued. She was also felt to have a UTI she was prescribed Keflex at 503 times a day she  is picking that up today. The patient is not felt to have an arterial issue. ABIs in our clinic were 1.24 on the right and 1.06 on the left. She had formal studies in 2018 at which time her ABIs were 1.21 on the right TBI of 0.76 and on the left 1.35 and 0.75 respectively. Waveforms are on the right were monophasic and biphasic, triphasic and biphasic on the left. She is tolerated 4 layer compression in the past 10/13. Comes in today having not used her compression pumps /perhaps once in the last week. She has too much edema to consider healing these wounds. We have been using silver alginate 10/20; she tells me he had she used her compression pumps once a day as we asked. Clearly she has edema out of her legs although most of it seems to be settling around her knees which is what she complains about. We have been using silver alginate to the extensive de-epithelialized area on the left lateral calf and a much smaller area on the right lateral extending linearly into the right posterior 10/27; she is using her compression pumps daily. Her edema control is better. We have been using silver alginate to the extensive wound areas on her bilateral lower legs. This includes left anterior left lateral and left posterior. Right anterior right lateral and right posterior. The wounds are not all in the same condition. She has severe bilateral skin damage with the epithelialization, flaking dried epithelium 11/3; she is using her compression pumps 1 times daily. Pretty obvious that her wraps fell down especially on the left leg to mid calf. I think things are improving on the right lateral  calf, I do not see anything open posteriorly. The major areas on the left lateral where she has 3 or 4 deeper wounds in the midst of an entire de-epithelialized area. There is erythema here but no tenderness I think this represents venous stasis rather than cellulitis. I debrided the areas on the left lateral calf last week  but once again they have tightly adherent debris over the top of them which is disappointing 11/10; the patient's major areas on the left lateral calf to 3 wounds with some depth surrounded by a large area of de-epithelialized tissue. There is no infection. We have been using silver alginate under 4-layer compression. On the right lateral she has a more contained wound area that is superficial. Been using silver alginate under 4-layer compression 11/17; the patient's major wound is on the left lateral calf. We have been using silver alginate under compression. There is no open wound per se on the right but it de-epithelialized area on the lateral calf that is weeping edema fluid. I do not see much change here today. 12/1; patient arrives in clinic with her wounds looking better. Her edema control is better. The lymphedema specialist working on the left leg we have been working on the right. We have been using 4-layer compression. The patient is using a consider external compression pumps and Hydrofera Blue to the open areas. She is being measured for an abdominal compression pump and apparently that is going to go through her insurance 12/15; continued improvement. 2 small areas on the left and slightly larger 2 small areas on the right. 12/22; patient still has open areas on the right lateral calf which are superficial and the new one on the right anterior calf. She did not use her compression pumps this week. Her left leg is wrapped by our lymphedema specialist. There are no open wounds here 12/29; she still has a superficial area on the right lateral calf and area on the right medial calf. Right anterior wound area appears to have closed. She has much better edema control today than last week 04/11/2019; the patient is here with severe bilateral lymphedema, chronic venous insufficiency with marked skin changes in her lower extremities. We are wrapping her right leg and her lymphedema specialist is  putting lymphedema wraps on the left. She states she is using her compression pumps although in the past her compliance with uses been limited because of osteoarthritic pain in her knees. She only has one remaining area on the right leg however she has wrap injuries on the left at the upper level what where I think her wraps were cause. the left lateral area left posterior. These would be new this week 1/12; generally better. The wrap injuries in the upper calf on the left from last week are down to a single wound. She has 2 wounds anteriorly that are still weeping fluid although these appear to have been epithelialized surface for the most part. There is no open wounds on the right leg we are wrapping her right leg and 4-layer compression and we are going a lymphedema wrap on the left leg. The the approach is a Farrow 2000 wrap on the left leg and then subsequently the right leg. She claims to be compliant with her compression pumps 1/19; the patient has 1 tiny open area on the right medial calf. The rest of this is not open. She however has a large erythematous denuded area on the left anterior tibia extending laterally. There is no evidence  of infection here. She has her new current waist high compression pumps 1/26; the patient has no open wounds on the right. On the left anteriorly much better than last week still a smattering of small weeping open areas. There is no evidence of infection 2/2; still no open wounds on the right leg. He still does not have a external compression garment. She arrives in clinic today with an open area on the plantar aspect of the left second toe at the base of the PIP. This apparently was painful last week so it was not wrapped by our lymphedema specialist. There was no open wound at the time. The patient denies traumatizing the toe however today she arrives with a small but deep wound in this area the dorsal surface of the toe is very tender. 2/11; she arrives in  clinic today with everything epithelialized. She was that way last week on the right but we have still been wrapping. She has been using lymphedema wraps on the left. On careful inspection of the left leg she still has areas that look vulnerable but no clearly open wound there may be some weeping through cracks in the skin. She is using above the waist external compression pumps and she has a separate external compression garment for her thighs. The plan is to discharge her for follow-up in lymphedema clinic with Juliann Pulse our lymphedema specialist. In the past healing this woman has resulted in relatively quick breakdown however she generally seems more motivated example willing to use her external compression pumps etc. READMISSION 02/13/2020. Mrs. Harney is a now 74 year old woman who has bilateral lower extremity lymphedema and a history of difficult to treat bilateral lower extremity wounds. When she was here last time we had her seen by a lymphedema specialist. She had lower extremity compression stockings as well as compression garments for her thighs. She has compression pumps as well. Unfortunately she has not been doing well. She describes unrelenting nausea with vomiting anytime she tries to eat or drink or take her medications. She was seen in urgent care on 10/26. Notably they found her to be somewhat dehydrated. Gave her a liter of fluid. Her creatinine was up to 2.42. The rest of her lab work including liver function tests, white count, hemoglobin were all normal. She did have a urine for culture that showed Klebsiella she was prescribed ciprofloxacin but I seriously doubt that she is able to keep this down by her description. She has not taken any of her meds in over a month. She states she feels too weak to take care of her lower extremities with compression garments and her external compression pumps she has not been using any of these. As a result she has 2 large areas on the left lateral  calf one anteriorly a cluster of 3 on the right lateral and a small area on her right ankle. All of these are superficial. She has poorly controlled lymphedema especially on the anterior tibial areas however by my my anxiety she actually looks like she has less swelling than what I remember. Past medical history includes type 2 diabetes with a recent hemoglobin A1c of 6.8 chronic venous insufficiency, lymphedema which she was managing with above the waist compression and compression pumps, diastolic congestive heart failure. Nausea and vomiting as described above. We did not repeat her arterial studies today however these were quite normal when they were last done in 2018 with an ABI of 1.21 on the right and 1.35 on the left.  TBI's were within normal limits at 0.76 and 0.75 she had triphasic waveforms on the left monophasic waveforms at the posterior tibial but triphasic waveforms at the dorsalis pedis. She has previously tolerated 4 layer compression 11/18; the patient has a cluster of wounds on the right lateral lower calf and areas on the left lateral calf. We have been using silver alginate under compression she seems to be tolerating this well. Her edema is improved. HOWEVER she arrives in clinic today still with a description of bilious vomiting that is making it impossible for her to eat and drink. Her vitals are stable however she looks more gaunt. I have tried to talk her into going to the emergency room to be checked out however she will not hear anything of it Electronic Signature(s) Signed: 02/22/2020 5:14:03 PM By: Linton Ham MD Entered By: Linton Ham on 02/22/2020 10:26:46 -------------------------------------------------------------------------------- Physical Exam Details Patient Name: Date of Service: Carrizales, [name] 02/22/2020 9:15 A M Medical Record Number: [id number] Patient Account Number: [id number] Date of Birth/Sex: Treating RN: 1945/06/12 (74 y.o. Debby Bud Primary Care Provider: [name] Other Clinician: Referring Provider: Treating Provider/Extender: [name] in Treatment: 1 Constitutional Sitting or standing Blood Pressure is within target range for patient.. Pulse regular and within target range for patient.Marland Kitchen Respirations regular, non-labored and within target range.. Temperature is normal and within the target range for the patient.Marland Kitchen Appears in no distress. Eyes Conjunctivae clear. No discharge.no icterus. Cardiovascular Heart sounds are normal there is no doubt she is somewhat dehydrated. Gastrointestinal (GI) Abdomen is soft and non-distended without masses or tenderness. However deep palpation in the epigastrium reveals nausea. No liver or spleen enlargement. Genitourinary (GU) Bladder without fullness, masses or tenderness. No CVA tenderness. Notes Wound exam; the amount and edema in her lower legs is improved versus last week. Her wound areas on the right lower lateral and the left lateral are better. Healthier looking wound beds. Electronic Signature(s) Signed: 02/22/2020 5:14:03 PM By: Linton Ham MD Entered By: Linton Ham on 02/22/2020 10:30:35 -------------------------------------------------------------------------------- Physician Orders Details Patient Name: Date of Service: Wainwright, [name] 02/22/2020 9:15 A M Medical Record Number: [id number] Patient Account Number: [id number] Date of Birth/Sex: Treating RN: 1945/04/08 (74 y.o. Helene Shoe, Tammi Klippel Primary Care Provider: [name] Other Clinician: Referring Provider: Treating Provider/Extender: [name] in Treatment: 1 Verbal / Phone Orders: No Diagnosis Coding ICD-10 Coding Code Description [phone] Non-pressure chronic ulcer of other part of left lower leg limited to breakdown of skin L97.811 Non-pressure chronic ulcer of other part of right lower leg limited to  breakdown of skin I89.0 Lymphedema, not elsewhere classified E86.0 Dehydration R11.2 Nausea with vomiting, unspecified Follow-up Appointments ppointment in 2 weeks. - Friday 03/08/2020 Return A *** Patient needs to go to the ED for evaluation of prolonged nausea and vomiting.**** Nurse Visit: - Tuesday 02/27/2020 Dressing Change Frequency Do not change entire dressing for one week. Skin Barriers/Peri-Wound Care TCA Cream or Ointment - mixed with lotion to both legs. Wound Cleansing May shower with protection. - use a cast protector. Primary Wound Dressing Wound #42 Left,Anterior Lower Leg Calcium Alginate with Silver Wound #43 Left,Lateral Lower Leg Calcium Alginate with Silver Wound #44 Right,Lateral Lower Leg Calcium Alginate with Silver Wound #45 Left,Distal,Lateral Lower Leg Calcium Alginate with Silver Wound #46 Right,Distal,Lateral Lower Leg Calcium Alginate with Silver Secondary Dressing Dry Gauze ABD pad Edema Control 4 layer compression - Bilateral - first layer of unna boot applied to  upper portion of lower legs to aid in securing the wraps. Avoid standing for long periods of time Elevate legs to the level of the heart or above for 30 minutes daily and/or when sitting, a frequency of: - 3-4 times a day throughout the day. Exercise regularly Segmental Compressive Device. - use lymphedema pumps once a day for an hour at a time. Additional Orders / Instructions Other: - Please go to the Emergency department for evaluation of prolonged nausea and vomiting. Electronic Signature(s) Signed: 02/22/2020 5:14:03 PM By: Linton Ham MD Signed: 02/22/2020 5:38:55 PM By: Deon Pilling Entered By: Deon Pilling on 02/22/2020 10:02:23 -------------------------------------------------------------------------------- Problem List Details Patient Name: Date of Service: Lown, [name] 02/22/2020 9:15 A M Medical Record Number: [id number] Patient Account Number:  [id number] Date of Birth/Sex: Treating RN: 1945/10/09 (74 y.o. 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 leg limited to breakdown 02/13/2020 No Yes of skin L97.811 Non-pressure chronic ulcer of other part of right lower leg limited to breakdown 02/13/2020 No Yes of skin I89.0 Lymphedema, not elsewhere classified 02/13/2020 No Yes E86.0 Dehydration 02/13/2020 No Yes R11.2 Nausea with vomiting, unspecified 02/13/2020 No Yes Inactive Problems Resolved Problems Electronic Signature(s) Signed: 02/22/2020 5:14:03 PM By: Linton Ham MD Entered By: Linton Ham on 02/22/2020 10:24:07 -------------------------------------------------------------------------------- Progress Note Details Patient Name: Date of Service: Brester, [name] 02/22/2020 9:15 A M Medical Record Number: [id number] Patient Account Number: [id number] Date of Birth/Sex: Treating RN: [birth year] (74 y.o. Debby Bud Primary Care Provider: [name] Other Clinician: Referring Provider: Treating Provider/Extender: [name] in Treatment: 1 Subjective History of Present Illness (HPI) 04/09/15; the patient is here for review of wounds on her bilateral lower extremities for the last month. The patient has a history of lymphedema and bilateral chronic venous insufficiency with inflammation she was here for review of wounds on her bilateral legs in 2014 she was discharged home with external compression pumps which she is currently not using. Over the last month she developed wounds on her bilateral lower legs with drainage and pain and she is here for our review of this. 04/16/15; the patient's edema is  under much better control. She has 3 areas one on the right anterior medial leg one on the left posterior leg and a small open area with purulent drainage on the left anterior leg. I have cultured the area on the left anterior leg 04/23/15; continued improvement in the patient's edema. All her wounds of closed I see no open areas. Culture from the left anterior leg last time was negative. READMISSION 02/07/16; this patient is a woman that I saw her earlier in 2017. I believe we discharged her very quickly with bilateral wounds in her legs secondary to chronic venous hypertension with inflammation and secondary lymphedema. She had juxtalite stockings. She has external compression pumps at home from a stay in our clinic in 2014. She tells me that she's had wounds on her bilateral lower legs since March or April of this year. 3 wounds on the left 2 on the right. She is a type II diabetic and has a history of noncompressible vessels bilaterally although she is tolerated 4-layer compression. The last note from her primary physician Dr. [name] was in May. At that point Dr. [name] had referred the  patient here for lymphedema management. The patient stated she made a phone call to year but was not able to arrange an appointment. It is not clear that she is actually using anything on the wounds currently except some gauze that was saturated with drainage per our intake nurse. She is certainly not using her juxtalite stockings and by the patient's own admission she is not using her compression pumps because they are "irritating". I don't see a recent hemoglobin A1c. The patient states she has not been systemically unwell but the legs are painful 02/14/16; patient tolerated her Profore wraps reasonably well. She is using the compression pumps 1 time per day. Measurements of her legs are better in terms of the amount of lymphedema 02/21/16; patient tolerated her Profore wraps it. She says she is  using her compression pumps one or 2 times a day. Measurements of her leg circumference are improved and most of her wound dimensions are better, unfortunately the home health that we referred her to did not except her insurance but works successful in getting her accompanied that would except her insurance however she apparently has a $25 co-pay which she cannot afford as she is on a fixed income 03/05/16; patient states she did not back using the pumps. She is healed that 2 wounds on the left leg. She still has areas on the right lateral lower leg, right medial lower leg and left lateral lower leg. All of these look some better and have reduced in area 03/13/16; still 3 wounds 1 on the left lateral leg, one on the left medial/posterior leg which is really the most extensive and a small area on the right lateral leg. All of this in the setting of severe chronic lymphedema and chronic venous inflammation. Damage to the skin with cobblestone thickened skin. 03/20/16; still 2 areas. The area on the left lateral leg appears to of closed over. The right medial leg is still most extensive wound although it appears to be closing over right lateral only a small open area remains. Using silver alginate Profore wraps 03/27/16; the area on the left leg remains closed. The edema control is better on the left than the right. The right medial leg is still weeping but everything appears to be epithelialized. The lateral aspect of the right leg appears closed. She tells me she has old juxtalite stockings. She has been compliant with her compression pumps. 04/03/16; the area on the left leg remains closed. She has ordered new juxtalite stockings. The right posterior medial wound is fully epithelialized. However she has an irritated area over the right Achilles that she states is been itching all week under the wraps.. She has been compliant with her compression pumps 04/10/16; the area on the left leg remains closed.  The right posterior medial wound is also closed today. The irritated area over the right Achilles which was probably wrap injury is also fully epithelialized and closed. The patient has not received her new juxtalite stockings, she arrives in clinic today with an old juxtalite stocking in place. Sheis using her compression pumps secondary to her severe stage III lymphedema READMISSION 01/21/17; this is a patient we know from several previous stays in this clinic. She has lymphedema with chronic venous insufficiency and inflammation. The last time she was here we discharged her with bilateral juxta lites which she is compliant with. She also has external compression pumps at home from stays in our clinic in 2014 although she tells me that she is not  very compliant with these as when she uses them a causes pain in her bilateral knees the patient states that her wounds on her left greater than right leg including 2 large areas on the left anterior and left lateral leg and an area on the right leg open in late June or July since then she is been followed by [facility] and she's had bilateral Unna boot supplied every week for the last month.. I think the wounds have actually improved although they are not specifically dressed. She states she had an ultrasound to rule out DVT bilaterally that was negative. She does not have arterial studies although she is a diabetic. She states she has a lot of pain in her legs she states she does not use her external compression pumps because it causes pain in her knees. As usual her ABIs were noncompressible bilaterally in this clinic 01/28/17; currently the patient only has one small wound on the left lateral leg and I think this should be healed next week. We are going to try to get her bilateral extremitease stockings. She tells me that compression pumps heard her posterior rib arthritic knees so she is not been compliant with this 02/04/17; the patient's wounds are  totally healed. She has chronic venous insufficiency and secondary lymphedema. She has new extremitease stockings. She also has compression pumps Readmission: 04/21/17 on evaluation today patient appears to be doing about the same as what she was previous when we were evaluating her back in November 2018. At that point she had completely healed. With that being said she has now reopened and states that she wasn't aware that she was supposed to come back. 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 Subsequently she also states that although she has the compression that we got for her previously the extremitease stockings she nonetheless doesn't always wear these and being noncompliant often has things will reopen as far as ulcers on her lower extremities. No fevers, chills, nausea, or vomiting noted at this time. Patient has no evidence of dementia. She is having some discomfort in regard to the ulcers at this point. 04/28/17 on evaluation today patient appears to be doing significantly better in regard to her wounds. With that being said she is still having significant discomfort and is not really keen on sharp debridement. She does not appear to have any evidence of infection which is good news I do think the Iodoflex is beneficial for her. Of note her ABI's were found in the system to be on the right 1.21 with a TBI of 0.76 and on the left 1.35 with a TBI of 0.75. In general her swelling appears to be significantly improved however. No fevers, chills, nausea, or vomiting noted at this time. Patient has been tolerating the wraps without complication. She has no evidence of dementia. 05/05/17-she is here in follow-up evaluation for bilateral lower extremity venous and lymphedema ulcers. She states she has not been using her lymphedema pumps secondary to pain at her knees. After discussion it was  noted that she uses knee-high lymphedema pumps but does have a pair of thigh-high lymphedema pumps. She was strongly advised to use the thigh-high lymphedema pumps and if necessary reduce the setting for improved tolerance. She reports no change in odor and/or drainage. She continues to tolerate sharp debridement poorly with significant pain, primarily to the right lower extremity. We will continue  with current treatment plan, along with improved lymphedema pump use and follow-up next week 05/12/17 on evaluation today patient actually appears to be doing much better in regard to her bilateral lower extremity ulcers. Only the right altar actually requires debridement today. The other two cleaned off nicely in two of the three in fact on the left lower extremity or actually almost completely healed. She does have some weeping noted still the bilateral lower extremities left greater than right. She still is not able to use her compression pumps even though I have mentioned this several times to her as did Leah last week. No fevers, chills, nausea, or vomiting noted at this time. Patient has been tolerating the dressing changes without complication. Patient has no evidence of dementia 05/19/17 on evaluation today patient appears to be doing very well regard to her bilateral lower extremity ulcers. In fact she seems to be improving and in fact healed in a couple of locations that were giving her trouble last week. Overall I'm extremely pleased with how things have progressed. She still has some discomfort especially in the right lateral lower extremity. Fortunately there does not appear to be any evidence of significant infection which is good news she does tell me that she has used her compression pumps several times although not routinely over the past week I did praise her for this. I do think it will be beneficial and that might even be what's helping with her wounds as far as healing is concerned at least  to a degree. No fevers, chills, nausea, or vomiting noted at this time. Patient has no dementia and no evidence of infection. 05/26/17 on evaluation today patient presents for evaluation concerning her bilateral lower extremity ulcer secondary to lymphedema. Fortunately she appears to be doing very well at this point in time. Specifically her right leg appears to be completely closed and healed. The left leg though there is still a small area of opening overall has healed. She does have a little bit of excoriation at the top or the wraps are because they slid down on her. Otherwise there's no evidence of infection and the other has improved dramatically now that she is not draining as significantly. Patient is having no significant pain she tells me she has been able to use her lymphedema pumps one time a day although that's not all she can tolerate. Even that is difficult for her. 06/02/17 on evaluation today patient's ulcers appear to be completely closed at this point. She does still note some odor but I believe this is due to the fact she has not been able to wash her legs as well currently. Fortunately she did bring her compression with her today. She does have the EXTREMIT-EASE Compression. With that being said she is somewhat nervous about going back to this as previously she broke out yet again once we discontinue wrapping her. READMISSION 11/23/17; patient is now 74 year old diabetic woman with severe chronic venous insufficiency with lymphedema. We care for her last in this clinic in February 2019 at which time she had chronic venous insufficiency with lymphedema bilateral lower extremity ulcers. We discharged her with bilateral wrap around/extremitease stockings and compression pumps. Is fairly clear she is not using the compression pumps stating it causes pain in her knees. In any case she was admitted to hospital from 8/3 through 8/6 with a several week history of recurrent bilateral ulcers.  She was felt to have bilateral cellulitis treated with doxycycline IV. She is still on  oral doxycycline and has another 2 days worth. At discharge her white count was 14.1 hemoglobin at 8.9. The patient is a diabetic however her last ABIs in 2018 showed a right ABI of 1.21, left of 1.35. TBI's were 0.76 on the right 0.75 on the left. Posterior tibial artery was monophasic on the right triphasic on the left. Dorsalis pedis triphasic on the right and triphasic on the left. She is not felt to have significant macrovascular disease 12/02/17;significant bilateral lower extremity wounds secondary to severe uncontrolled lymphedema. She did not get home health out to change the wraps. So she kept this on all week. She is telling me she using his her compression pumps once a week. In general all the wounds look better. We've been using silver alginate with secondary absorptive dressings 4 layer compression 12/10/17; bilateral lower extremity wounds secondary to uncontrolled lymphedema. She tells me she is using her compression pumps on most days. We've been keeping wraps on all week. Using silver alginate. She has wounds on the right lateral calf left medial and left lateral calf. All of this is somewhat better 12/16/17; the patient has 3 small open areas. One on the right lateral calf, one on the left medial calf and the small area on the left lateral calf. She has lymphedema, chronic venous insufficiency with hemosiderin deposition. Very fibrotic distal lower extremity scan with suggestion of an inverted bottle sign appearance to her legs. She has compression pumps but she does not use them has not used them in the last week. Currently we have her in 4 layer compression, silver alginate to the wounds and this is being changed once by home health. She is noncompliant with the compression pumps because she says it hurts her knees. She also has compression stockings ando Juxta light stockings at home 12/24/17; the  patient's right lateral calf is healed as well as the left medial. There is no open wound on the right leg. She still has 2 areas on the left lateral calf. We transitioned the right leg into an extremitease stocking which she brought in from home. 12/31/17; the patient has a superficial wound on the left lateral lower calf. We've been wrapping this leg. She arrives today with the right leg swollen but without a wound. She cannot get her extremities stocking on the right leg. There is a lot more edema here. She is not using her compression pumps 01/10/2018; patient has a same superficial wound on the left lateral calf. Her right leg has less peripheral edema. She tells me she also started herself on some Lasix that had been previously prescribed for her at some point but she had never taken it. She is also concerned about whether her compression pumps are leaking. She has extremitease stockings however the edema gets bad enough she can get these on. Currently she has no open wound on the right leg and is mention the edema is actually better than when I saw this 10 days ago 01/17/2018; patient has a same superficial area on the posterior left calf and a weeping area in the mid part of the right lateral calf. She still does not have anyone from [facility] coming to see her compression pumps which she describes as being suspicious for a leakage. I am not really sure how frequently she is using this in any case. She has massive bilateral calf lymphedema. I think there is some spreading lymphedema into her posterior thighs. 01/24/2018; the patient's edema in her legs is actually  some better. She still has a smaller area on the left posterior calf although this is better. The weeping area on the right lateral calf is also better. As it turned out she did not get her pumps from [facility] but [facility] and they are going to try to go out and see these this week which is going to help. I  explained to her that she will use the compression pumps once a day while we are wrapping her legs but when she transitions to stockings that may be she will require pumping twice a day. Her compliance in this area has not been high and I wonder about her ability to do this herself. 02/01/2018; the area on the posterior left leg is healed. She still has the original wound on the right lateral calf which is epithelialized. The however just above this there is still weeping lymphedema fluid. She has a new area on the upper right calf which is either wrap injury or is she points out where she has been scratching under the wraps. We still not have managed to get a hold of a representative of [facility] to see if the pump is operational. She has extremitease stockings at home however I am not of the opinion that this will maintain her skin integrity without the compression pumps. 02/08/2018; posterior left leg remains healed although she has a new small open area on the left lateral calf. Right lateral calf has two quarter sized open areas and close juxtaposition. There is less weeping edema fluid. She apparently managed to contact [facility]. They are sending her a part. But she is still not received it 02/15/2018; the patient has no open wound on either leg. Some scale present on the right upper lateral calf just below the fibular head. Edema control is excellent. She did not bring her stockings or her juxta lite wrap around. She has the part for her compression pump but she has not use the compression pumps yet READMISSION 01/10/2019 Patient is now a 74 year old woman that we have had in the clinic multiple times in the past. She has chronic lower extremity lymphedema, recurrent wounds on her bilateral lower extremities. She is also a type II diabetic reasonably well controlled with a recent hemoglobin A1c while she was in the hospital of 5.9. She has wounds on her bilateral lower  extremities mostly on the left lateral and right lateral. These were noted also during her recent hospitalization. The patient states they have been there for a while. She has not been able to wear her stockings he has trouble getting them on as she lives alone. She also has external compression pumps but uses them sparingly because it causes pain in her knees which hurt because of osteoarthritis. The patient was on doxycycline before she went into the hospital apparently for fear of infection in her legs and is on Keflex coming out because of a UTI The patient was recently in the hospital from 01/06/2019 through 01/09/2019. She was admitted with nausea and vomiting and prerenal azotemia. This was corrected with IV fluid her Lasix has been put on hold. Hemoglobin A1c at 5.9 her metformin was discontinued. She was also felt to have a UTI she was prescribed Keflex at 503 times a day she is picking that up today. The patient is not felt to have an arterial issue. ABIs in our clinic were 1.24 on the right and 1.06 on the left. She had formal studies in 2018 at which  time her ABIs were 1.21 on the right TBI of 0.76 and on the left 1.35 and 0.75 respectively. Waveforms are on the right were monophasic and biphasic, triphasic and biphasic on the left. She is tolerated 4 layer compression in the past 10/13. Comes in today having not used her compression pumps /perhaps once in the last week. She has too much edema to consider healing these wounds. We have been using silver alginate 10/20; she tells me he had she used her compression pumps once a day as we asked. Clearly she has edema out of her legs although most of it seems to be settling around her knees which is what she complains about. We have been using silver alginate to the extensive de-epithelialized area on the left lateral calf and a much smaller area on the right lateral extending linearly into the right posterior 10/27; she is using her compression  pumps daily. Her edema control is better. We have been using silver alginate to the extensive wound areas on her bilateral lower legs. This includes left anterior left lateral and left posterior. Right anterior right lateral and right posterior. The wounds are not all in the same condition. She has severe bilateral skin damage with the epithelialization, flaking dried epithelium 11/3; she is using her compression pumps 1 times daily. Pretty obvious that her wraps fell down especially on the left leg to mid calf. I think things are improving on the right lateral calf, I do not see anything open posteriorly. The major areas on the left lateral where she has 3 or 4 deeper wounds in the midst of an entire de-epithelialized area. There is erythema here but no tenderness I think this represents venous stasis rather than cellulitis. I debrided the areas on the left lateral calf last week but once again they have tightly adherent debris over the top of them which is disappointing 11/10; the patient's major areas on the left lateral calf to 3 wounds with some depth surrounded by a large area of de-epithelialized tissue. There is no infection. We have been using silver alginate under 4-layer compression. On the right lateral she has a more contained wound area that is superficial. Been using silver alginate under 4-layer compression 11/17; the patient's major wound is on the left lateral calf. We have been using silver alginate under compression. There is no open wound per se on the right but it de-epithelialized area on the lateral calf that is weeping edema fluid. I do not see much change here today. 12/1; patient arrives in clinic with her wounds looking better. Her edema control is better. The lymphedema specialist working on the left leg we have been working on the right. We have been using 4-layer compression. The patient is using a consider external compression pumps and Hydrofera Blue to the open areas.  She is being measured for an abdominal compression pump and apparently that is going to go through her insurance 12/15; continued improvement. 2 small areas on the left and slightly larger 2 small areas on the right. 12/22; patient still has open areas on the right lateral calf which are superficial and the new one on the right anterior calf. She did not use her compression pumps this week. Her left leg is wrapped by our lymphedema specialist. There are no open wounds here 12/29; she still has a superficial area on the right lateral calf and area on the right medial calf. Right anterior wound area appears to have closed. She has much better edema  control today than last week 04/11/2019; the patient is here with severe bilateral lymphedema, chronic venous insufficiency with marked skin changes in her lower extremities. We are wrapping her right leg and her lymphedema specialist is putting lymphedema wraps on the left. She states she is using her compression pumps although in the past her compliance with uses been limited because of osteoarthritic pain in her knees. She only has one remaining area on the right leg however she has wrap injuries on the left at the upper level what where I think her wraps were cause. the left lateral area left posterior. These would be new this week 1/12; generally better. The wrap injuries in the upper calf on the left from last week are down to a single wound. She has 2 wounds anteriorly that are still weeping fluid although these appear to have been epithelialized surface for the most part. There is no open wounds on the right leg we are wrapping her right leg and 4-layer compression and we are going a lymphedema wrap on the left leg. The the approach is a Farrow 2000 wrap on the left leg and then subsequently the right leg. She claims to be compliant with her compression pumps 1/19; the patient has 1 tiny open area on the right medial calf. The rest of this is not open.  She however has a large erythematous denuded area on the left anterior tibia extending laterally. There is no evidence of infection here. She has her new current waist high compression pumps 1/26; the patient has no open wounds on the right. On the left anteriorly much better than last week still a smattering of small weeping open areas. There is no evidence of infection 2/2; still no open wounds on the right leg. He still does not have a external compression garment. She arrives in clinic today with an open area on the plantar aspect of the left second toe at the base of the PIP. This apparently was painful last week so it was not wrapped by our lymphedema specialist. There was no open wound at the time. The patient denies traumatizing the toe however today she arrives with a small but deep wound in this area the dorsal surface of the toe is very tender. 2/11; she arrives in clinic today with everything epithelialized. She was that way last week on the right but we have still been wrapping. She has been using lymphedema wraps on the left. On careful inspection of the left leg she still has areas that look vulnerable but no clearly open wound there may be some weeping through cracks in the skin. She is using above the waist external compression pumps and she has a separate external compression garment for her thighs. The plan is to discharge her for follow-up in lymphedema clinic with Juliann Pulse our lymphedema specialist. In the past healing this woman has resulted in relatively quick breakdown however she generally seems more motivated example willing to use her external compression pumps etc. READMISSION 02/13/2020. Mrs. Caridi is a now 74 year old woman who has bilateral lower extremity lymphedema and a history of difficult to treat bilateral lower extremity wounds. When she was here last time we had her seen by a lymphedema specialist. She had lower extremity compression stockings as well as  compression garments for her thighs. She has compression pumps as well. Unfortunately she has not been doing well. She describes unrelenting nausea with vomiting anytime she tries to eat or drink or take her medications. She was seen  in urgent care on 10/26. Notably they found her to be somewhat dehydrated. Gave her a liter of fluid. Her creatinine was up to 2.42. The rest of her lab work including liver function tests, white count, hemoglobin were all normal. She did have a urine for culture that showed Klebsiella she was prescribed ciprofloxacin but I seriously doubt that she is able to keep this down by her description. She has not taken any of her meds in over a month. She states she feels too weak to take care of her lower extremities with compression garments and her external compression pumps she has not been using any of these. As a result she has 2 large areas on the left lateral calf one anteriorly a cluster of 3 on the right lateral and a small area on her right ankle. All of these are superficial. She has poorly controlled lymphedema especially on the anterior tibial areas however by my my anxiety she actually looks like she has less swelling than what I remember. Past medical history includes type 2 diabetes with a recent hemoglobin A1c of 6.8 chronic venous insufficiency, lymphedema which she was managing with above the waist compression and compression pumps, diastolic congestive heart failure. Nausea and vomiting as described above. We did not repeat her arterial studies today however these were quite normal when they were last done in 2018 with an ABI of 1.21 on the right and 1.35 on the left. TBI's were within normal limits at 0.76 and 0.75 she had triphasic waveforms on the left monophasic waveforms at the posterior tibial but triphasic waveforms at the dorsalis pedis. She has previously tolerated 4 layer compression 11/18; the patient has a cluster of wounds on the right lateral  lower calf and areas on the left lateral calf. We have been using silver alginate under compression she seems to be tolerating this well. Her edema is improved. HOWEVER she arrives in clinic today still with a description of bilious vomiting that is making it impossible for her to eat and drink. Her vitals are stable however she looks more gaunt. I have tried to talk her into going to the emergency room to be checked out however she will not hear anything of it Objective Constitutional Sitting or standing Blood Pressure is within target range for patient.. Pulse regular and within target range for patient.Marland Kitchen Respirations regular, non-labored and within target range.. Temperature is normal and within the target range for the patient.Marland Kitchen Appears in no distress. Vitals Time Taken: 9:26 AM, Height: 71 in, Weight: 260 lbs, BMI: 36.3, Temperature: 97.6 F, Pulse: 101 bpm, Respiratory Rate: 20 breaths/min, Blood Pressure: 130/78 mmHg. Eyes Conjunctivae clear. No discharge.no icterus. Cardiovascular Heart sounds are normal there is no doubt she is somewhat dehydrated. Gastrointestinal (GI) Abdomen is soft and non-distended without masses or tenderness. However deep palpation in the epigastrium reveals nausea. No liver or spleen enlargement. Genitourinary (GU) Bladder without fullness, masses or tenderness. No CVA tenderness. General Notes: Wound exam; the amount and edema in her lower legs is improved versus last week. Her wound areas on the right lower lateral and the left lateral are better. Healthier looking wound beds. Integumentary (Hair, Skin) Wound #42 status is Open. Original cause of wound was Gradually Appeared. The wound is located on the Left,Anterior Lower Leg. The wound measures 2.1cm length x 3.2cm width x 0.1cm depth; 5.278cm^2 area and 0.528cm^3 volume. There is Fat Layer (Subcutaneous Tissue) exposed. There is no tunneling or undermining noted. There is a medium amount  of  serosanguineous drainage noted. The wound margin is flat and intact. There is large (67-100%) red, pink granulation within the wound bed. There is no necrotic tissue within the wound bed. Wound #43 status is Open. Original cause of wound was Gradually Appeared. The wound is located on the Left,Lateral Lower Leg. The wound measures 1.3cm length x 3.2cm width x 0.1cm depth; 3.267cm^2 area and 0.327cm^3 volume. There is Fat Layer (Subcutaneous Tissue) exposed. There is no tunneling or undermining noted. There is a medium amount of serosanguineous drainage noted. The wound margin is flat and intact. There is large (67-100%) pink granulation within the wound bed. There is a small (1-33%) amount of necrotic tissue within the wound bed including Adherent Slough. Wound #44 status is Open. Original cause of wound was Gradually Appeared. The wound is located on the Right,Lateral Lower Leg. The wound measures 1.5cm length x 3.2cm width x 0.1cm depth; 3.77cm^2 area and 0.377cm^3 volume. There is Fat Layer (Subcutaneous Tissue) exposed. There is no tunneling or undermining noted. There is a medium amount of serosanguineous drainage noted. The wound margin is flat and intact. There is large (67-100%) red granulation within the wound bed. There is no necrotic tissue within the wound bed. Wound #45 status is Open. Original cause of wound was Gradually Appeared. The wound is located on the Left,Distal,Lateral Lower Leg. The wound measures 3.6cm length x 4.5cm width x 0.1cm depth; 12.723cm^2 area and 1.272cm^3 volume. There is Fat Layer (Subcutaneous Tissue) exposed. There is no tunneling or undermining noted. There is a medium amount of serosanguineous drainage noted. The wound margin is flat and intact. There is large (67-100%) pink granulation within the wound bed. There is a small (1-33%) amount of necrotic tissue within the wound bed including Adherent Slough. Wound #46 status is Open. Original cause of wound was  Gradually Appeared. The wound is located on the Right,Distal,Lateral Lower Leg. The wound measures 1.2cm length x 1cm width x 0.1cm depth; 0.942cm^2 area and 0.094cm^3 volume. There is Fat Layer (Subcutaneous Tissue) exposed. There is no tunneling or undermining noted. There is a small amount of serosanguineous drainage noted. The wound margin is flat and intact. There is large (67-100%) pink granulation within the wound bed. There is no necrotic tissue within the wound bed. Assessment Active Problems ICD-10 Non-pressure chronic ulcer of other part of left lower leg limited to breakdown of skin Non-pressure chronic ulcer of other part of right lower leg limited to breakdown of skin Lymphedema, not elsewhere classified Dehydration Nausea with vomiting, unspecified Procedures Wound #42 Pre-procedure diagnosis of Wound #42 is a Lymphedema located on the Left,Anterior Lower Leg . There was a Four Layer Compression Therapy Procedure by Baruch Gouty, RN. Post procedure Diagnosis Wound #42: Same as Pre-Procedure Wound #43 Pre-procedure diagnosis of Wound #43 is a Lymphedema located on the Left,Lateral Lower Leg . There was a Four Layer Compression Therapy Procedure by Baruch Gouty, RN. Post procedure Diagnosis Wound #43: Same as Pre-Procedure Wound #44 Pre-procedure diagnosis of Wound #44 is a Lymphedema located on the Right,Lateral Lower Leg . There was a Four Layer Compression Therapy Procedure by Baruch Gouty, RN. Post procedure Diagnosis Wound #44: Same as Pre-Procedure Wound #45 Pre-procedure diagnosis of Wound #45 is a Lymphedema located on the Left,Distal,Lateral Lower Leg . There was a Four Layer Compression Therapy Procedure by Baruch Gouty, RN. Post procedure Diagnosis Wound #45: Same as Pre-Procedure Wound #46 Pre-procedure diagnosis of Wound #46 is a Lymphedema located on the Right,Distal,Lateral Lower Leg .  There was a Four Layer Compression Therapy Procedure by  Baruch Gouty, RN. Post procedure Diagnosis Wound #46: Same as Pre-Procedure Plan Follow-up Appointments: Return Appointment in 2 weeks. - Friday 03/08/2020 *** Patient needs to go to the ED for evaluation of prolonged nausea and vomiting.**** Nurse Visit: - Tuesday 02/27/2020 Dressing Change Frequency: Do not change entire dressing for one week. Skin Barriers/Peri-Wound Care: TCA Cream or Ointment - mixed with lotion to both legs. Wound Cleansing: May shower with protection. - use a cast protector. Primary Wound Dressing: Wound #42 Left,Anterior Lower Leg: Calcium Alginate with Silver Wound #43 Left,Lateral Lower Leg: Calcium Alginate with Silver Wound #44 Right,Lateral Lower Leg: Calcium Alginate with Silver Wound #45 Left,Distal,Lateral Lower Leg: Calcium Alginate with Silver Wound #46 Right,Distal,Lateral Lower Leg: Calcium Alginate with Silver Secondary Dressing: Dry Gauze ABD pad Edema Control: 4 layer compression - Bilateral - first layer of unna boot applied to upper portion of lower legs to aid in securing the wraps. Avoid standing for long periods of time Elevate legs to the level of the heart or above for 30 minutes daily and/or when sitting, a frequency of: - 3-4 times a day throughout the day. Exercise regularly Segmental Compressive Device. - use lymphedema pumps once a day for an hour at a time. Additional Orders / Instructions: Other: - Please go to the Emergency department for evaluation of prolonged nausea and vomiting. 1 wounds look Satisfactory both right lower and left lateral better. better edema control. 2. Her wounds are not the big issue here however the patient looks got somewhat dehydrated. Her vital signs are stable and her abdomen is benign to exam however she is not able to eat and drink. I suggested she go to the emergency room in fact I asked her to go and she refused. I tried to get out why she was so scared to go but if she knows something she  is not saying 3. I am concerned about this patient, if we do not reverse this she is soon going to end up in the hospital. Paced on bedside exam other than the dehydration, I do not see any obvious cause. Her bilious vomiting suggests gallbladder/biliary tract pathology Electronic Signature(s) Signed: 02/22/2020 5:14:03 PM By: Linton Ham MD Entered By: Linton Ham on 02/22/2020 10:32:54 -------------------------------------------------------------------------------- SuperBill Details Patient Name: Date of Service: Doria, [name] 02/22/2020 Medical Record Number: [id number] Patient Account Number: [id number] Date of Birth/Sex: Treating RN: 28-Nov-1945 (74 y.o. Helene Shoe, Tammi Klippel Primary Care Provider: [name] Other Clinician: Referring Provider: Treating Provider/Extender: [name] in Treatment: 1 Diagnosis Coding ICD-10 Codes Code Description [phone] Non-pressure chronic ulcer of other part of left lower leg limited to breakdown of skin L97.811 Non-pressure chronic ulcer of other part of right lower leg limited to breakdown of skin I89.0 Lymphedema, not elsewhere classified E86.0 Dehydration R11.2 Nausea with vomiting, unspecified Facility Procedures CPT4: Code [id number] Description: 81 BILATERAL: Application of multi-layer venous compression system; leg (below knee), including ankle and t. Modifier: Quantity: 1 Physician Procedures : CPT4 Code Description Modifier [id number] - WC PHYS LEVEL 3 - EST PT ICD-10 Diagnosis Description L97.821 Non-pressure chronic ulcer of other part of left lower leg limited to breakdown of skin L97.811 Non-pressure chronic ulcer of other part of  right lower leg limited to breakdown of skin Quantity: 1 Electronic Signature(s) Signed: 02/22/2020 5:14:03 PM By: Linton Ham MD Entered By: Linton Ham on 02/22/2020 10:33:15

## 2020-02-23 ENCOUNTER — Encounter (HOSPITAL_COMMUNITY): Payer: Self-pay | Admitting: Emergency Medicine

## 2020-02-23 ENCOUNTER — Emergency Department (HOSPITAL_COMMUNITY): Payer: PPO

## 2020-02-23 ENCOUNTER — Other Ambulatory Visit: Payer: Self-pay

## 2020-02-23 ENCOUNTER — Emergency Department (HOSPITAL_COMMUNITY)
Admission: EM | Admit: 2020-02-23 | Discharge: 2020-02-24 | Disposition: A | Discharge status: Home / Self Care | Payer: PPO | Attending: Emergency Medicine | Admitting: Emergency Medicine

## 2020-02-23 DIAGNOSIS — R5381 Other malaise: Secondary | ICD-10-CM

## 2020-02-23 DIAGNOSIS — R944 Abnormal results of kidney function studies: Secondary | ICD-10-CM | POA: Insufficient documentation

## 2020-02-23 DIAGNOSIS — M47816 Spondylosis without myelopathy or radiculopathy, lumbar region: Secondary | ICD-10-CM | POA: Diagnosis not present

## 2020-02-23 DIAGNOSIS — I130 Hypertensive heart and chronic kidney disease with heart failure and stage 1 through stage 4 chronic kidney disease, or unspecified chronic kidney disease: Secondary | ICD-10-CM | POA: Diagnosis not present

## 2020-02-23 DIAGNOSIS — E1122 Type 2 diabetes mellitus with diabetic chronic kidney disease: Secondary | ICD-10-CM | POA: Diagnosis not present

## 2020-02-23 DIAGNOSIS — Z7982 Long term (current) use of aspirin: Secondary | ICD-10-CM | POA: Diagnosis not present

## 2020-02-23 DIAGNOSIS — N183 Chronic kidney disease, stage 3 unspecified: Secondary | ICD-10-CM | POA: Insufficient documentation

## 2020-02-23 DIAGNOSIS — N179 Acute kidney failure, unspecified: Secondary | ICD-10-CM | POA: Diagnosis not present

## 2020-02-23 DIAGNOSIS — R531 Weakness: Secondary | ICD-10-CM | POA: Diagnosis not present

## 2020-02-23 DIAGNOSIS — Z7984 Long term (current) use of oral hypoglycemic drugs: Secondary | ICD-10-CM | POA: Diagnosis not present

## 2020-02-23 DIAGNOSIS — Z87891 Personal history of nicotine dependence: Secondary | ICD-10-CM | POA: Insufficient documentation

## 2020-02-23 DIAGNOSIS — R110 Nausea: Secondary | ICD-10-CM

## 2020-02-23 DIAGNOSIS — Z79899 Other long term (current) drug therapy: Secondary | ICD-10-CM | POA: Diagnosis not present

## 2020-02-23 DIAGNOSIS — I509 Heart failure, unspecified: Secondary | ICD-10-CM | POA: Diagnosis not present

## 2020-02-23 DIAGNOSIS — N281 Cyst of kidney, acquired: Secondary | ICD-10-CM | POA: Diagnosis not present

## 2020-02-23 DIAGNOSIS — R112 Nausea with vomiting, unspecified: Secondary | ICD-10-CM | POA: Insufficient documentation

## 2020-02-23 DIAGNOSIS — R1111 Vomiting without nausea: Secondary | ICD-10-CM | POA: Diagnosis not present

## 2020-02-23 DIAGNOSIS — I10 Essential (primary) hypertension: Secondary | ICD-10-CM | POA: Diagnosis not present

## 2020-02-23 LAB — COMPREHENSIVE METABOLIC PANEL
ALT: 23 U/L (ref 0–44)
AST: 40 U/L (ref 15–41)
Albumin: 3.9 g/dL (ref 3.5–5.0)
Alkaline Phosphatase: 48 U/L (ref 38–126)
Anion gap: 20 — ABNORMAL HIGH (ref 5–15)
BUN: 33 mg/dL — ABNORMAL HIGH (ref 8–23)
CO2: 19 mmol/L — ABNORMAL LOW (ref 22–32)
Calcium: 10 mg/dL (ref 8.9–10.3)
Chloride: 101 mmol/L (ref 98–111)
Creatinine, Ser: 1.79 mg/dL — ABNORMAL HIGH (ref 0.44–1.00)
GFR, Estimated: 30 mL/min — ABNORMAL LOW (ref >60)
Glucose, Bld: 62 mg/dL — ABNORMAL LOW (ref 70–99)
Potassium: 4.5 mmol/L (ref 3.5–5.1)
Sodium: 140 mmol/L (ref 135–145)
Total Bilirubin: 1.2 mg/dL (ref 0.3–1.2)
Total Protein: 8.3 g/dL — ABNORMAL HIGH (ref 6.5–8.1)

## 2020-02-23 LAB — CBC
HCT: 39.4 % (ref 36.0–46.0)
Hemoglobin: 12.3 g/dL (ref 12.0–15.0)
MCH: 24.8 pg — ABNORMAL LOW (ref 26.0–34.0)
MCHC: 31.2 g/dL (ref 30.0–36.0)
MCV: 79.4 fL — ABNORMAL LOW (ref 80.0–100.0)
Platelets: 281 10*3/uL (ref 150–400)
RBC: 4.96 MIL/uL (ref 3.87–5.11)
RDW: 20.1 % — ABNORMAL HIGH (ref 11.5–15.5)
WBC: 6.4 10*3/uL (ref 4.0–10.5)
nRBC: 0 % (ref 0.0–0.2)

## 2020-02-23 LAB — LIPASE, BLOOD: Lipase: 100 U/L — ABNORMAL HIGH (ref 11–51)

## 2020-02-23 IMAGING — CT CT ABD-PELV W/O CM
2 of 4 series · 16 of 46 positions shown, 18 images · non-contrast
Comparison: [DATE]

CLINICAL DATA: Nausea and vomiting.

EXAM:
CT ABDOMEN AND PELVIS WITHOUT CONTRAST
TECHNIQUE: Multidetector CT imaging of the abdomen and pelvis was performed
following the standard protocol without IV contrast.

[Series 2: axial st · axial · 0.76mm/px · z∈[+1132,+1502]mm · 13 of 82 slices shown, 15 images]
[im 4/82  soft-tissue]
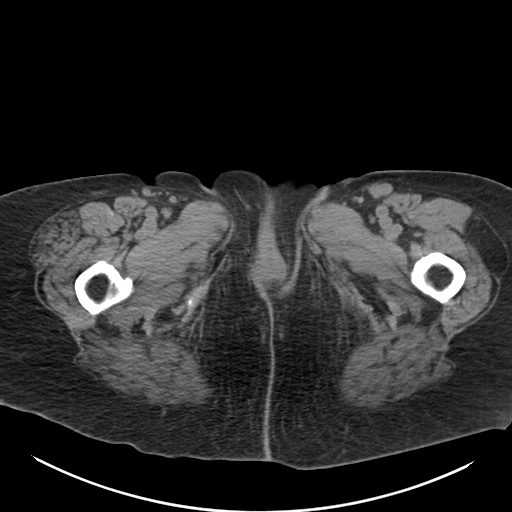
[im 4/82  bone]
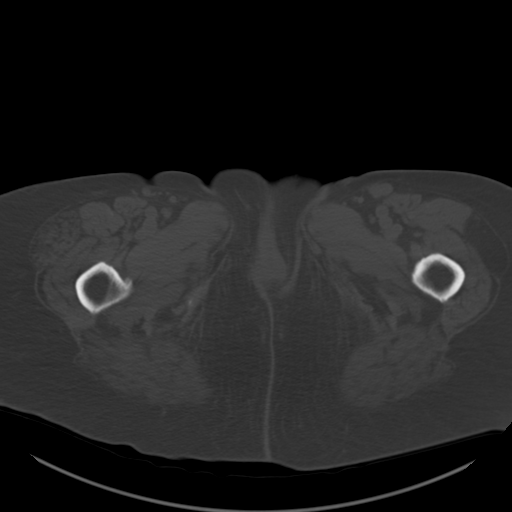
[im 12/82  soft-tissue]
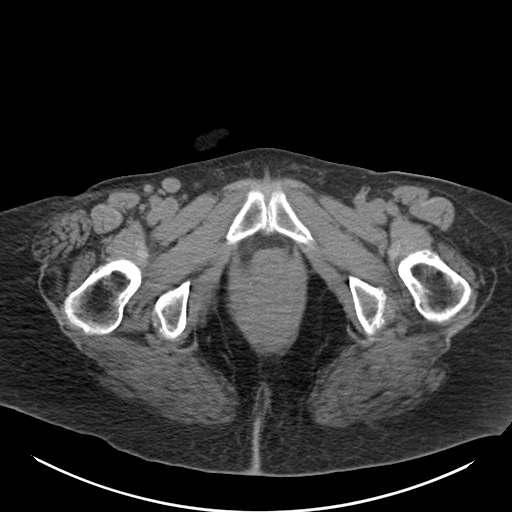
[im 16/82  soft-tissue]
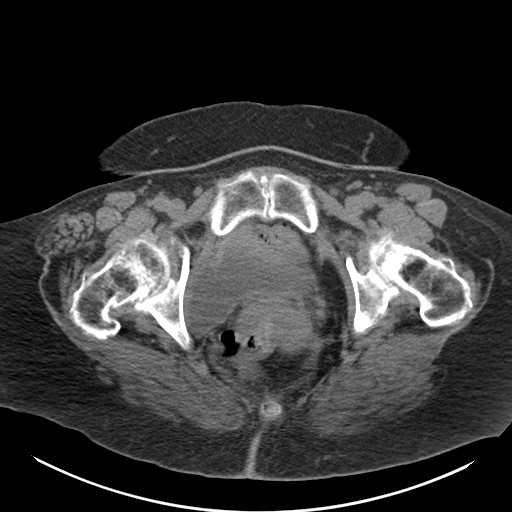
[im 24/82  soft-tissue]
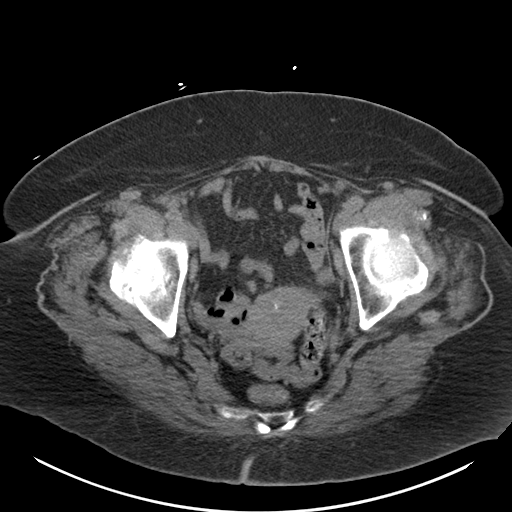
[im 28/82  soft-tissue]
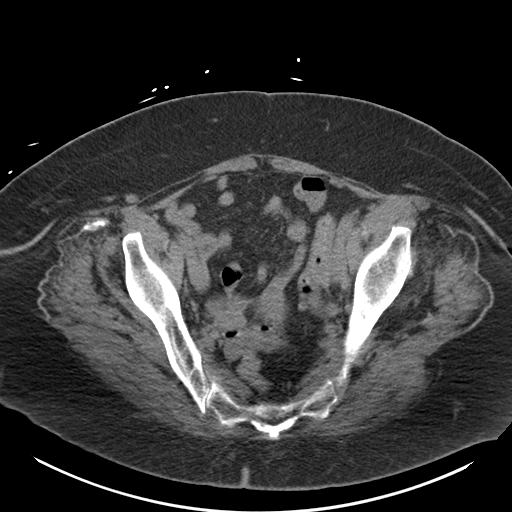
[im 35/82  soft-tissue]
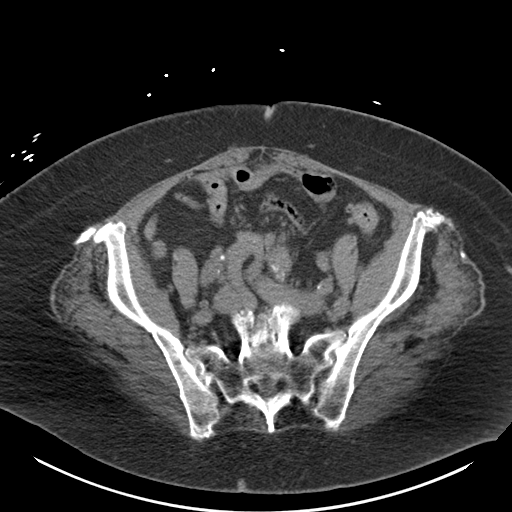
[im 43/82  soft-tissue]
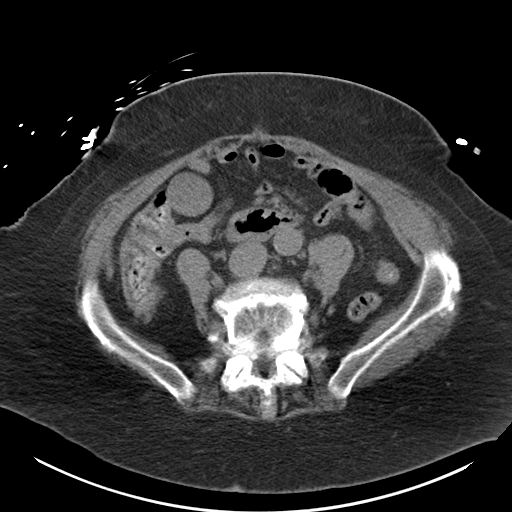
[im 47/82  soft-tissue]
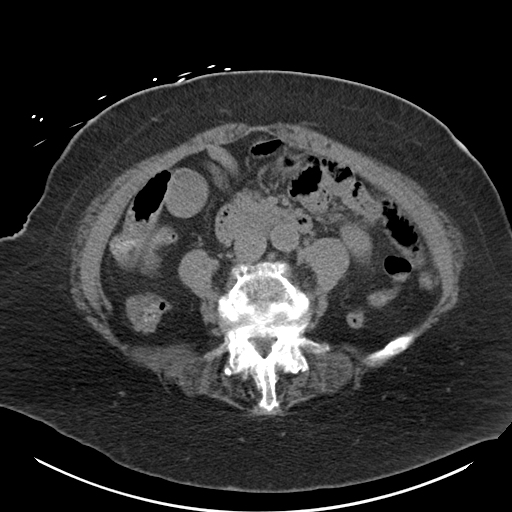
[im 55/82  soft-tissue]
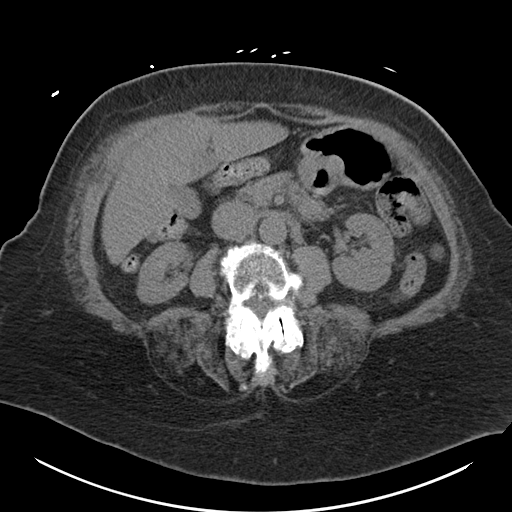
[im 55/82  bone]
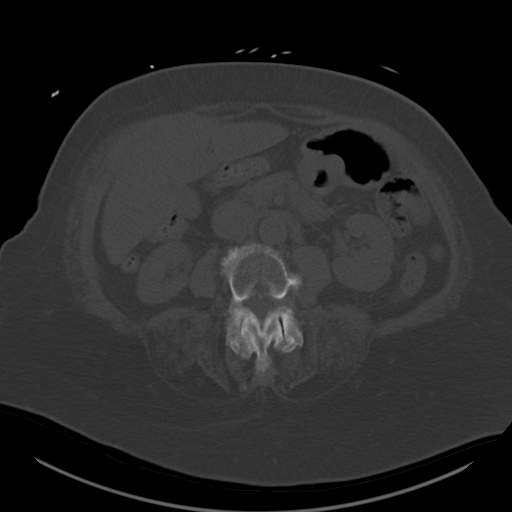
[im 58/82  soft-tissue]
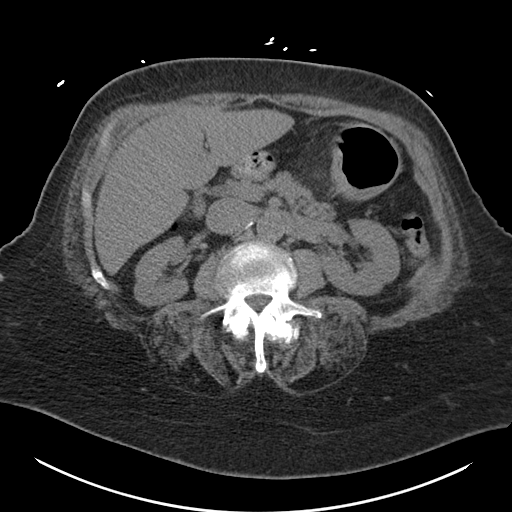
[im 66/82  soft-tissue]
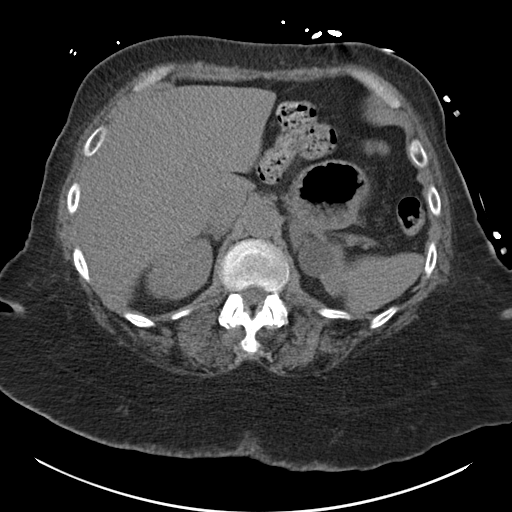
[im 70/82  soft-tissue]
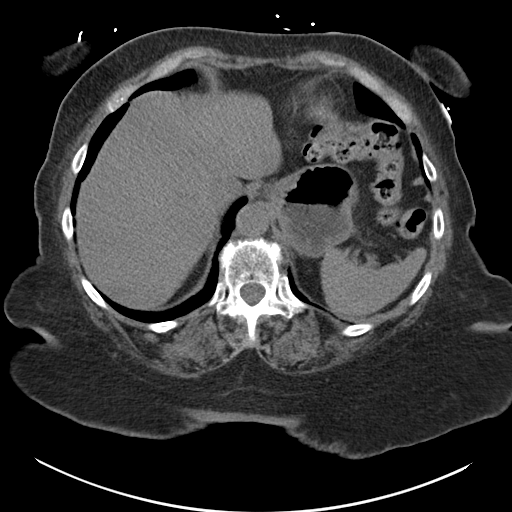
[im 78/82  soft-tissue]
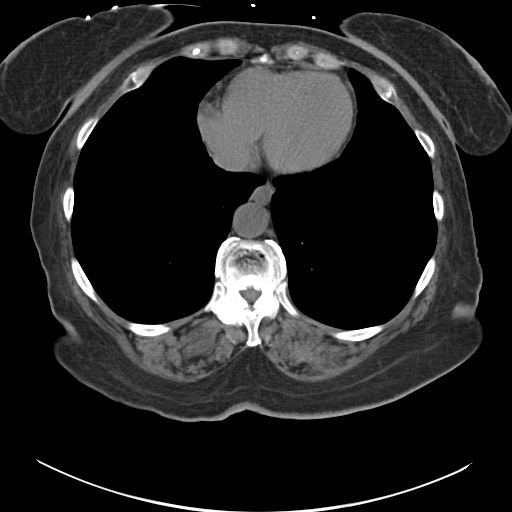

[Series 5: coronal st · coronal · 0.74mm/px · 3 of 174 slices shown]
[im 58/174  soft-tissue]
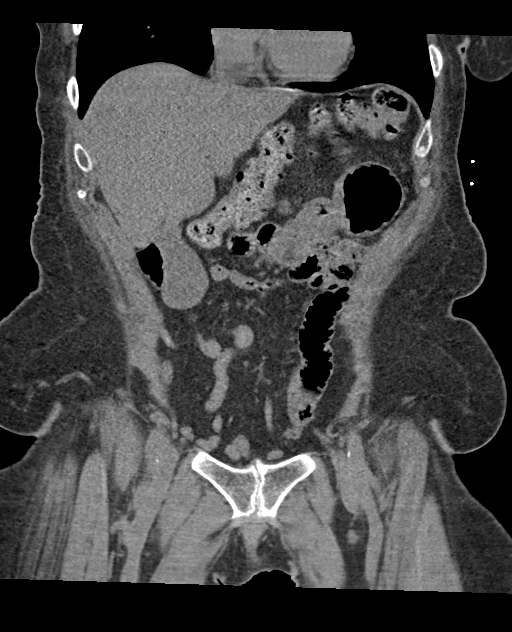
[im 77/174  soft-tissue]
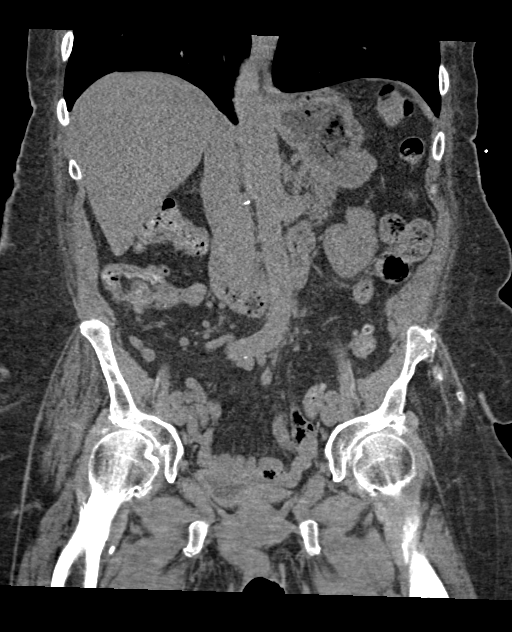
[im 97/174  soft-tissue]
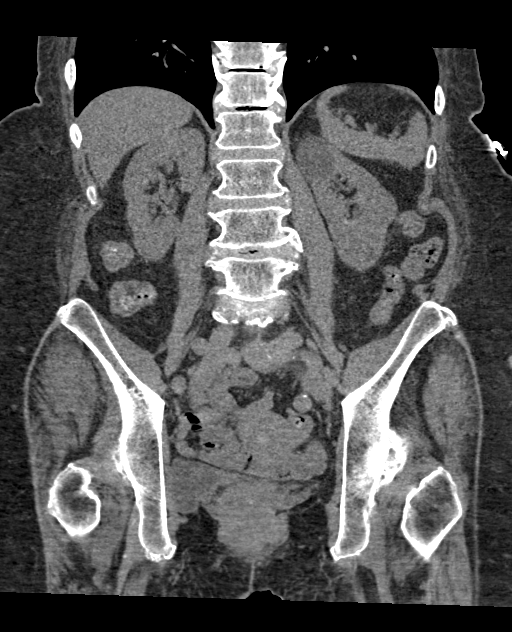

[16 of 46 positions shown; findings below may reference images not displayed]

FINDINGS: Lower chest: No acute abnormality.

Hepatobiliary: No focal liver abnormality is seen. No gallstones,
gallbladder wall thickening, or biliary dilatation.

Pancreas: Unremarkable. No pancreatic ductal dilatation or
surrounding inflammatory changes.

Spleen: Normal in size without focal abnormality.

Adrenals/Urinary Tract: Adrenal glands are unremarkable. Kidneys are
normal in size, without renal calculi or hydronephrosis. A 0.8 cm
exophytic cyst is seen along the lateral aspect of the mid right
kidney. A stable 3.1 cm cyst is seen within the medial aspect of the
upper pole of the left kidney. An additional 0.7 cm lower pole left
renal cyst is noted. Asymmetric filling of the uterine lumen is
again seen.

Stomach/Bowel: Stomach is within normal limits. Appendix appears
normal. No evidence of bowel dilatation. Noninflamed diverticula are
seen throughout the large bowel.

Vascular/Lymphatic: No significant vascular findings are present.
Multiple small stable bilateral inguinal are noted.

Reproductive: Uterus and bilateral adnexa are unremarkable.

Other: No abdominal wall hernia or abnormality. No abdominopelvic
ascites.

Musculoskeletal: Degenerative changes are seen throughout the lumbar
spine.
IMPRESSION: 1. Colonic diverticulosis without evidence of acute diverticulitis.
2. Bilateral renal cysts.

## 2020-02-23 MED ORDER — ONDANSETRON HCL 4 MG/2ML IJ SOLN
4.0000 mg | Freq: Once | INTRAMUSCULAR | Status: AC
  Administered 2020-02-23: 4 mg via INTRAVENOUS
  Filled 2020-02-23: qty 2

## 2020-02-23 MED ORDER — SODIUM CHLORIDE 0.9 % IV BOLUS
1000.0000 mL | Freq: Once | INTRAVENOUS | Status: AC
  Administered 2020-02-23: 1000 mL via INTRAVENOUS

## 2020-02-23 NOTE — Telephone Encounter (Signed)
Spoke with Patient she stated that she could not leave a sample on 02/15/2020. Pt stated that she is going to go to the ED because her condition is getting worse.

## 2020-02-23 NOTE — ED Triage Notes (Signed)
Patient here from home reports that she lives home alone, reporting nausea vomiting for 1 month. Seen multiple times over the years for same. Reports medication with no relief.

## 2020-02-24 LAB — URINALYSIS, ROUTINE W REFLEX MICROSCOPIC
Bilirubin Urine: NEGATIVE
Glucose, UA: NEGATIVE mg/dL
Ketones, ur: 20 mg/dL — AB
Nitrite: NEGATIVE
Protein, ur: 30 mg/dL — AB
Specific Gravity, Urine: 1.021 (ref 1.005–1.030)
pH: 5 (ref 5.0–8.0)

## 2020-02-24 LAB — TROPONIN I (HIGH SENSITIVITY)
Troponin I (High Sensitivity): 24 ng/L — ABNORMAL HIGH (ref <18)
Troponin I (High Sensitivity): 34 ng/L — ABNORMAL HIGH (ref <18)

## 2020-02-24 MED ORDER — SODIUM CHLORIDE 0.9 % IV SOLN: Freq: Once | INTRAVENOUS | Status: AC

## 2020-02-24 MED ORDER — ONDANSETRON HCL 4 MG/2ML IJ SOLN
4.0000 mg | Freq: Once | INTRAMUSCULAR | Status: AC
  Administered 2020-02-24: 4 mg via INTRAVENOUS
  Filled 2020-02-24: qty 2

## 2020-02-24 NOTE — ED Provider Notes (Signed)
[facility] DEPT Provider Note   CSN: [id number] Arrival date & time: 02/23/20  1922     History Chief Complaint  Patient presents with  . Nausea  . Emesis    [name] [name] is a 74 y.o. female.  Patient reports that she has been having generalized fatigue, malaise as well as nausea and vomiting for the past month.  Reports that symptoms have not changed recently, she just feels like she is not getting better.  Reports that she has been treated for UTI with ciprofloxacin and fosfomycin which she has completed.  Has not had any improvement in her symptoms.  No abdominal pain, just nausea.  No vomiting today, does not feel like she has anything left.  HPI     Past Medical History:  Diagnosis Date  . Acute kidney injury ([place])   . Aortic stenosis, mild 11/17/2013  . Arthritis    "both knees, spine, back, fingers" (11/29/2013)  . CHF (congestive heart failure) ([place])   . Edema   . GERD (gastroesophageal reflux disease)   . Heart murmur   . History of diastolic dysfunction    Echo 07/4008 with diastolic dysfunction  . HTN (hypertension)   . Morbid obesity ([place])   . OA (osteoarthritis)   . PAC (premature atrial contraction)    per prior Holter  . Palpitations   . Pneumonia    "as a child"  . PVC's (premature ventricular contractions)    per prior Holter  . SVT (supraventricular tachycardia) ([place])   . Tachycardia    noted at 07/28/11 visit. Started on beta blocker. Possible atrial flutter vs long PR tachycardia/AVNRT  . Type II diabetes mellitus [facility])     Patient Active Problem List   Diagnosis Date Noted  . Intractable vomiting 02/15/2020  . Klebsiella cystitis 02/15/2020  . Bilateral leg ulcer, limited to breakdown of skin ([place]) 01/16/2020  . Decreased urination 01/16/2020  . Dehydration 01/16/2020  . Allergic rhinitis 07/28/2019  . Postnasal drip 11/16/2017  . Sensorineural hearing loss (SNHL), bilateral 11/16/2017  . Tinnitus of both  ears 11/16/2017  . Obesity, Class II, BMI 35-39.9, isolated (see actual BMI) 11/08/2017  . CKD (chronic kidney disease), stage III ([place]) 11/08/2017  . Anemia 11/07/2017  . Thrombocytosis 11/07/2017  . Lymphedema 11/07/2017  . AKI (acute kidney injury) ([place]) 10/16/2017  . Routine general medical examination at a health care facility 05/10/2014  . Paroxysmal SVT (supraventricular tachycardia) ([place]) 11/28/2013  . Physical deconditioning 11/23/2013  . Generalized weakness 11/17/2013  . Nausea 11/17/2013  . Multilevel spine pain 11/17/2013  . Aortic stenosis, mild 11/17/2013  . DM type 2 (diabetes mellitus, type 2) ([place]) 02/02/2013  . Arthritis 10/19/2012  . Mixed incontinence 10/19/2012  . GERD (gastroesophageal reflux disease) 04/20/2011  . HTN (hypertension) 01/20/2011  . Morbid obesity ([place])     Past Surgical History:  Procedure Laterality Date  . CESAREAN SECTION  1985  . CESAREAN SECTION  1985  . SUPRAVENTRICULAR TACHYCARDIA ABLATION  11/29/2013  . SUPRAVENTRICULAR TACHYCARDIA ABLATION N/A 11/29/2013   Procedure: SUPRAVENTRICULAR TACHYCARDIA ABLATION;  Surgeon: Evans Lance, MD;  Location: [facility] CATH LAB;  Service: Cardiovascular;  Laterality: N/A;  . US ECHOCARDIOGRAPHY  07/25/2008   EF 55-60%  . VAGINAL DELIVERY       OB History   No obstetric history on file.     Family History  Problem Relation Age of Onset  . Alzheimer's disease Mother   . Lung cancer Mother   .  COPD Father   . Diabetes Maternal Uncle   . Stroke Neg Hx   . Colon cancer Neg Hx   . Stomach cancer Neg Hx   . Esophageal cancer Neg Hx     Social History   Tobacco Use  . Smoking status: Former Smoker    Packs/day: 2.00    Years: 15.00    Pack years: 30.00    Types: Cigarettes    Quit date: 11/23/1985    Years since quitting: 34.2  . Smokeless tobacco: Never Used  Vaping Use  . Vaping Use: Never used  Substance Use Topics  . Alcohol use: No  . Drug use: No    Home Medications Prior to  Admission medications   Medication Sig Start Date End Date Taking? Authorizing Provider  fosfomycin (MONUROL) 3 g PACK  02/16/20  Yes [provider]  acetaminophen (TYLENOL) 325 MG tablet Take 2 tablets (650 mg total) by mouth every 6 (six) hours as needed for mild pain (or Fever >/= 101). Patient not taking: Reported on 02/23/2020 11/22/13   Delfina Redwood, MD  aspirin EC 81 MG tablet Take 81 mg by mouth daily with breakfast. Takes once or twice weekly Patient not taking: Reported on 02/23/2020    [provider]  celecoxib (CELEBREX) 100 MG capsule TAKE 1 CAPSULE BY MOUTH TWICE A DAY Patient not taking: Reported on 02/23/2020 02/02/20   Hoyt Koch, MD  ciprofloxacin (CIPRO) 250 MG tablet Take 1 tablet (250 mg total) by mouth 2 (two) times daily. Patient not taking: Reported on 02/23/2020 02/05/20   Binnie Rail, MD  feeding supplement, ENSURE COMPLETE, (ENSURE COMPLETE) LIQD Take 237 mLs by mouth 2 (two) times daily between meals. Patient not taking: Reported on 02/23/2020 07/28/19   Hoyt Koch, MD  furosemide (LASIX) 40 MG tablet TAKE 1 TABLET BY MOUTH EVERY DAY Patient not taking: Reported on 02/23/2020 04/10/19   Hoyt Koch, MD  losartan (COZAAR) 25 MG tablet TAKE 4 TABLETS (100 MG TOTAL) BY MOUTH DAILY. Patient not taking: Reported on 02/23/2020 05/08/19   Hoyt Koch, MD  metFORMIN (GLUCOPHAGE) 500 MG tablet Take 1 tablet (500 mg total) by mouth daily with breakfast. Patient not taking: Reported on 02/23/2020 05/08/19   Hoyt Koch, MD  methocarbamol (ROBAXIN) 500 MG tablet Take 1 tablet (500 mg total) by mouth every 6 (six) hours as needed for muscle spasms. Patient not taking: Reported on 02/23/2020 04/12/17   Hoyt Koch, MD  metoprolol tartrate (LOPRESSOR) 50 MG tablet TAKE 1 TABLET BY MOUTH TWICE A DAY Patient not taking: Reported on 02/23/2020 05/08/19   Hoyt Koch, MD  montelukast (SINGULAIR)  10 MG tablet Take 1 tablet (10 mg total) by mouth at bedtime. Patient not taking: Reported on 02/23/2020 07/28/19   Hoyt Koch, MD  mupirocin ointment (BACTROBAN) 2 % SMARTSIG:1 Application Topical 2-3 Times Daily Patient not taking: Reported on 02/23/2020 02/13/20   [provider]  Neomycin-Bacitracin-Polymyxin ([facility] ANTIBIOTIC) [phone] [facility] Patient not taking: Reported on 02/23/2020 11/23/16   Hoyt Koch, MD  nutrition supplement, JUVEN, (JUVEN) PACK Take 1 packet by mouth 2 (two) times daily between meals. Patient not taking: Reported on 02/23/2020 07/28/19   Hoyt Koch, MD  ondansetron (ZOFRAN) 4 MG tablet Take 1 tablet (4 mg total) by mouth every 8 (eight) hours as needed for nausea or vomiting. Patient not taking: Reported on 02/23/2020 01/06/19   [name],  Real Cons, MD  pantoprazole (PROTONIX) 40 MG tablet TAKE 1 TABLET BY MOUTH EVERY DAY Patient not taking: Reported on 02/23/2020 04/10/19   Hoyt Koch, MD  polyethylene glycol [facility]) packet Take 17 g by mouth daily. Patient not taking: Reported on 02/23/2020 11/09/17   Charlynne Cousins, MD  promethazine (PHENERGAN) 25 MG tablet Take 1 tablet (25 mg total) by mouth every 8 (eight) hours as needed for refractory nausea / vomiting. Patient not taking: Reported on 02/23/2020 01/30/20   Raylene Everts, MD    Allergies    Oxycodone hcl and Penicillins  Review of Systems   Review of Systems  Constitutional: Negative for chills and fever.  HENT: Negative for ear pain and sore throat.   Eyes: Negative for pain and visual disturbance.  Respiratory: Negative for cough and shortness of breath.   Cardiovascular: Negative for chest pain and palpitations.  Gastrointestinal: Positive for nausea and vomiting. Negative for abdominal pain.  Genitourinary: Negative for dysuria and hematuria.  Musculoskeletal: Negative for arthralgias  and back pain.  Skin: Negative for color change and rash.  Neurological: Negative for seizures and syncope.  All other systems reviewed and are negative.   Physical Exam Updated Vital Signs BP 108/73   Pulse 71   Temp 98.6 F (37 C) (Oral)   Resp 14   SpO2 99%   Physical Exam Vitals and nursing note reviewed.  Constitutional:      General: She is not in acute distress.    Appearance: She is well-developed.  HENT:     Head: Normocephalic and atraumatic.  Eyes:     Conjunctiva/sclera: Conjunctivae normal.  Cardiovascular:     Rate and Rhythm: Normal rate and regular rhythm.     Heart sounds: No murmur heard.   Pulmonary:     Effort: Pulmonary effort is normal. No respiratory distress.     Breath sounds: Normal breath sounds.  Abdominal:     Palpations: Abdomen is soft.     Tenderness: There is no abdominal tenderness.  Musculoskeletal:        General: No deformity or signs of injury.     Cervical back: Neck supple.  Skin:    General: Skin is warm and dry.  Neurological:     General: No focal deficit present.     Mental Status: She is alert and oriented to person, place, and time.  Psychiatric:        Mood and Affect: Mood normal.        Behavior: Behavior normal.     ED Results / Procedures / Treatments   Labs (all labs ordered are listed, but only abnormal results are displayed) Labs Reviewed  LIPASE, BLOOD - Abnormal; Notable for the following components:      Result Value   Lipase 100 (*)    All other components within normal limits  COMPREHENSIVE METABOLIC PANEL - Abnormal; Notable for the following components:   CO2 19 (*)    Glucose, Bld 62 (*)    BUN 33 (*)    Creatinine, Ser 1.79 (*)    Total Protein 8.3 (*)    GFR, Estimated 30 (*)    Anion gap 20 (*)    All other components within normal limits  CBC - Abnormal; Notable for the following components:   MCV 79.4 (*)    MCH 24.8 (*)    RDW 20.1 (*)    All other components within normal limits   URINALYSIS, ROUTINE W  REFLEX MICROSCOPIC  TROPONIN I (HIGH SENSITIVITY)  TROPONIN I (HIGH SENSITIVITY)    EKG EKG Interpretation  Date/Time:  Friday February 23 2020 21:31:43 EST Ventricular Rate:  77 PR Interval:    QRS Duration: 116 QT Interval:  434 QTC Calculation: 492 R Axis:   -34 Text Interpretation: Sinus rhythm Left ventricular hypertrophy Anterior Q waves, possibly due to LVH Confirmed by Madalyn Rob [phone]) on 02/23/2020 10:50:48 PM   Radiology CT ABDOMEN PELVIS WO CONTRAST  Result Date: 02/23/2020 CLINICAL DATA:  Nausea and vomiting. EXAM: CT ABDOMEN AND PELVIS WITHOUT CONTRAST TECHNIQUE: Multidetector CT imaging of the abdomen and pelvis was performed following the standard protocol without IV contrast. COMPARISON:  January 06, 2019 FINDINGS: Lower chest: No acute abnormality. Hepatobiliary: No focal liver abnormality is seen. No gallstones, gallbladder wall thickening, or biliary dilatation. Pancreas: Unremarkable. No pancreatic ductal dilatation or surrounding inflammatory changes. Spleen: Normal in size without focal abnormality. Adrenals/Urinary Tract: Adrenal glands are unremarkable. Kidneys are normal in size, without renal calculi or hydronephrosis. A 0.8 cm exophytic cyst is seen along the lateral aspect of the mid right kidney. A stable 3.1 cm cyst is seen within the medial aspect of the upper pole of the left kidney. An additional 0.7 cm lower pole left renal cyst is noted. Asymmetric filling of the uterine lumen is again seen. Stomach/Bowel: Stomach is within normal limits. Appendix appears normal. No evidence of bowel dilatation. Noninflamed diverticula are seen throughout the large bowel. Vascular/Lymphatic: No significant vascular findings are present. Multiple small stable bilateral inguinal are noted. Reproductive: Uterus and bilateral adnexa are unremarkable. Other: No abdominal wall hernia or abnormality. No abdominopelvic ascites. Musculoskeletal:  Degenerative changes are seen throughout the lumbar spine. IMPRESSION: 1. Colonic diverticulosis without evidence of acute diverticulitis. 2. Bilateral renal cysts. Electronically Signed   By: Virgina Norfolk M.D.   On: 02/23/2020 23:19    Procedures Procedures (including critical care time)  Medications Ordered in ED Medications  ondansetron [facility]) injection 4 mg (4 mg Intravenous Given 02/23/20 2127)  sodium chloride 0.9 % bolus 1,000 mL (0 mLs Intravenous Stopped 02/23/20 2252)    ED Course  I have reviewed the triage vital signs and the nursing notes.  Pertinent labs & imaging results that were available during my care of the patient were reviewed by me and considered in my medical decision making (see chart for details).    MDM Rules/Calculators/A&P                          74 year old lady presents to ER with concern for nausea and vomiting.  On exam she is well-appearing in no distress with stable vital signs.  Abdomen is soft.  Given the chronicity of her symptoms, check CT scan which was negative for acute pathology.  Lab work concerning for possible AKI.  Baseline creatinine per PCP note is 1 but throughout her recent illness her creatinine has remained elevated, today is 1.79.  On reassessment she complains of ongoing nausea, will provide additional fluids. Awaiting UA.  If patient has any ongoing symptoms, would consider admission for observation, further IV rehydration and monitoring of Cr versus alternative management of close out pt f/u.   While awaiting UA, reassessment, patient signed out to Dr. Florina Ou.   Final Clinical Impression(s) / ED Diagnoses Final diagnoses:  Nausea  Malaise  AKI (acute kidney injury) ([place])    Rx / DC Orders ED Discharge Orders    None  Lucrezia Starch, MD 02/24/20 [phone]

## 2020-02-24 NOTE — ED Provider Notes (Addendum)
Nursing notes and vitals signs, including pulse oximetry, reviewed.  Summary of this visit's results, reviewed by myself:  EKG:  EKG Interpretation  Date/Time:  Friday February 23 2020 21:31:43 EST Ventricular Rate:  77 PR Interval:    QRS Duration: 116 QT Interval:  434 QTC Calculation: 492 R Axis:   -34 Text Interpretation: Sinus rhythm Left ventricular hypertrophy Anterior Q waves, possibly due to LVH Confirmed by Madalyn Rob [phone]) on 02/23/2020 10:50:48 PM       Labs:  Results for orders placed or performed during the hospital encounter of 02/23/20 (from the past 24 hour(s))  Urinalysis, Routine w reflex microscopic Urine, Clean Catch     Status: Abnormal   Collection Time: 02/23/20  8:00 PM  Result Value Ref Range   Color, Urine YELLOW YELLOW   APPearance CLOUDY (A) CLEAR   Specific Gravity, Urine 1.021 1.005 - 1.030   pH 5.0 5.0 - 8.0   Glucose, UA NEGATIVE NEGATIVE mg/dL   Hgb urine dipstick SMALL (A) NEGATIVE   Bilirubin Urine NEGATIVE NEGATIVE   Ketones, ur 20 (A) NEGATIVE mg/dL   Protein, ur 30 (A) NEGATIVE mg/dL   Nitrite NEGATIVE NEGATIVE   Leukocytes,Ua SMALL (A) NEGATIVE   RBC / HPF 0-5 0 - 5 RBC/hpf   WBC, UA 6-10 0 - 5 WBC/hpf   Bacteria, UA MANY (A) NONE SEEN   Squamous Epithelial / LPF 0-5 0 - 5   Mucus PRESENT    Hyaline Casts, UA PRESENT   Lipase, blood     Status: Abnormal   Collection Time: 02/23/20  8:56 PM  Result Value Ref Range   Lipase 100 (H) 11 - 51 U/L  Comprehensive metabolic panel     Status: Abnormal   Collection Time: 02/23/20  8:56 PM  Result Value Ref Range   Sodium 140 135 - 145 mmol/L   Potassium 4.5 3.5 - 5.1 mmol/L   Chloride 101 98 - 111 mmol/L   CO2 19 (L) 22 - 32 mmol/L   Glucose, Bld 62 (L) 70 - 99 mg/dL   BUN 33 (H) 8 - 23 mg/dL   Creatinine, Ser 1.79 (H) 0.44 - 1.00 mg/dL   Calcium 10.0 8.9 - 10.3 mg/dL   Total Protein 8.3 (H) 6.5 - 8.1 g/dL   Albumin 3.9 3.5 - 5.0 g/dL   AST 40 15 - 41 U/L   ALT 23 0 - 44  U/L   Alkaline Phosphatase 48 38 - 126 U/L   Total Bilirubin 1.2 0.3 - 1.2 mg/dL   GFR, Estimated 30 (L) >60 mL/min   Anion gap 20 (H) 5 - 15  CBC     Status: Abnormal   Collection Time: 02/23/20  8:56 PM  Result Value Ref Range   WBC 6.4 4.0 - 10.5 K/uL   RBC 4.96 3.87 - 5.11 MIL/uL   Hemoglobin 12.3 12.0 - 15.0 g/dL   HCT 39.4 36 - 46 %   MCV 79.4 (L) 80.0 - 100.0 fL   MCH 24.8 (L) 26.0 - 34.0 pg   MCHC 31.2 30.0 - 36.0 g/dL   RDW 20.1 (H) 11.5 - 15.5 %   Platelets 281 150 - 400 K/uL   nRBC 0.0 0.0 - 0.2 %  Troponin I (High Sensitivity)     Status: Abnormal   Collection Time: 02/23/20  8:56 PM  Result Value Ref Range   Troponin I (High Sensitivity) 24 (H) <18 ng/L  Troponin I (High Sensitivity)     Status: Abnormal  Collection Time: 02/24/20  1:14 AM  Result Value Ref Range   Troponin I (High Sensitivity) 34 (H) <18 ng/L    Imaging Studies: CT ABDOMEN PELVIS WO CONTRAST  Result Date: 02/23/2020 CLINICAL DATA:  Nausea and vomiting. EXAM: CT ABDOMEN AND PELVIS WITHOUT CONTRAST TECHNIQUE: Multidetector CT imaging of the abdomen and pelvis was performed following the standard protocol without IV contrast. COMPARISON:  January 06, 2019 FINDINGS: Lower chest: No acute abnormality. Hepatobiliary: No focal liver abnormality is seen. No gallstones, gallbladder wall thickening, or biliary dilatation. Pancreas: Unremarkable. No pancreatic ductal dilatation or surrounding inflammatory changes. Spleen: Normal in size without focal abnormality. Adrenals/Urinary Tract: Adrenal glands are unremarkable. Kidneys are normal in size, without renal calculi or hydronephrosis. A 0.8 cm exophytic cyst is seen along the lateral aspect of the mid right kidney. A stable 3.1 cm cyst is seen within the medial aspect of the upper pole of the left kidney. An additional 0.7 cm lower pole left renal cyst is noted. Asymmetric filling of the uterine lumen is again seen. Stomach/Bowel: Stomach is within normal limits.  Appendix appears normal. No evidence of bowel dilatation. Noninflamed diverticula are seen throughout the large bowel. Vascular/Lymphatic: No significant vascular findings are present. Multiple small stable bilateral inguinal are noted. Reproductive: Uterus and bilateral adnexa are unremarkable. Other: No abdominal wall hernia or abnormality. No abdominopelvic ascites. Musculoskeletal: Degenerative changes are seen throughout the lumbar spine. IMPRESSION: 1. Colonic diverticulosis without evidence of acute diverticulitis. 2. Bilateral renal cysts. Electronically Signed   By: Virgina Norfolk M.D.   On: 02/23/2020 23:19   3:00 AM Urine sent for culture.  Creatinine is only slightly elevated from several days ago and significantly improved from previous levels.  Her symptoms are becoming chronic and I do not see a justification for emergent admission.  She states she is feeling better after IV fluid bolus.  Discussed with Dr. Hal Hope of hospitalist service who does not see a justification for admission at this time either.    Shanon Rosser, MD 02/24/20 0235    Shanon Rosser, MD 02/24/20 0051

## 2020-02-24 NOTE — Discharge Instructions (Addendum)
Please follow-up with your primary doctor regarding your symptoms from today.  Discuss lab work from today and discuss monitoring of your kidney function.  Take nausea medicine as needed.  Return to ER if you develop any chest pain, difficulty in breathing, vomiting, uncontrolled nausea or other new concerning symptom.

## 2020-02-25 LAB — URINE CULTURE: Culture: <10000 — AB

## 2020-02-26 ENCOUNTER — Telehealth: Payer: Self-pay | Admitting: Internal Medicine

## 2020-02-26 NOTE — Telephone Encounter (Signed)
Patient was seen in the hospital on 11/19 and she states they didn't find anything wrong but she is still very nauseated, vomiting frequently, and unable to eat anything. She was wondering if there was anything that could be sent in that she can take rectally.  Patient # [id number]

## 2020-02-27 ENCOUNTER — Encounter (HOSPITAL_BASED_OUTPATIENT_CLINIC_OR_DEPARTMENT_OTHER): Payer: PPO | Admitting: Internal Medicine

## 2020-02-27 MED ORDER — METOCLOPRAMIDE HCL 5 MG PO TABS: 5.0000 mg | ORAL_TABLET | Freq: Three times a day (TID) | ORAL | 0 refills | Status: DC | PRN

## 2020-02-27 MED ORDER — PROMETHAZINE HCL 25 MG RE SUPP: 25.0000 mg | Freq: Four times a day (QID) | RECTAL | 0 refills | Status: DC | PRN

## 2020-02-27 NOTE — Telephone Encounter (Signed)
Sent in phenergan which can be used rectally. We have also sent in oral reglan which she has taken in the past for these episodes which has helped her more. She should take this with meals before the meals. If this does not work she needs to call us back and we may need to get her back in with GI.

## 2020-02-28 ENCOUNTER — Telehealth: Payer: Self-pay

## 2020-02-28 NOTE — Telephone Encounter (Signed)
Called patient follow up and inform her of the prescriptions sent. Voicemail was left to return call.

## 2020-03-04 ENCOUNTER — Encounter (HOSPITAL_BASED_OUTPATIENT_CLINIC_OR_DEPARTMENT_OTHER): Payer: PPO | Admitting: Internal Medicine

## 2020-03-04 ENCOUNTER — Other Ambulatory Visit: Payer: Self-pay

## 2020-03-04 DIAGNOSIS — L97822 Non-pressure chronic ulcer of other part of left lower leg with fat layer exposed: Secondary | ICD-10-CM | POA: Diagnosis not present

## 2020-03-04 DIAGNOSIS — E11622 Type 2 diabetes mellitus with other skin ulcer: Secondary | ICD-10-CM | POA: Diagnosis not present

## 2020-03-04 DIAGNOSIS — L97811 Non-pressure chronic ulcer of other part of right lower leg limited to breakdown of skin: Secondary | ICD-10-CM | POA: Diagnosis not present

## 2020-03-04 DIAGNOSIS — I890 Lymphedema, not elsewhere classified: Secondary | ICD-10-CM | POA: Diagnosis not present

## 2020-03-05 DIAGNOSIS — H11823 Conjunctivochalasis, bilateral: Secondary | ICD-10-CM | POA: Diagnosis not present

## 2020-03-05 DIAGNOSIS — H353131 Nonexudative age-related macular degeneration, bilateral, early dry stage: Secondary | ICD-10-CM | POA: Diagnosis not present

## 2020-03-05 DIAGNOSIS — H25813 Combined forms of age-related cataract, bilateral: Secondary | ICD-10-CM | POA: Diagnosis not present

## 2020-03-05 DIAGNOSIS — E119 Type 2 diabetes mellitus without complications: Secondary | ICD-10-CM | POA: Diagnosis not present

## 2020-03-05 DIAGNOSIS — H16223 Keratoconjunctivitis sicca, not specified as Sjogren's, bilateral: Secondary | ICD-10-CM | POA: Diagnosis not present

## 2020-03-05 DIAGNOSIS — H1045 Other chronic allergic conjunctivitis: Secondary | ICD-10-CM | POA: Diagnosis not present

## 2020-03-05 NOTE — Progress Notes (Signed)
[name] [name] [name], [name] [name] [name] ([id number]) Visit Report for 03/04/2020 HPI Details Patient Name: Date of Service: [name] [name] [name], [name] [name] [name] 03/04/2020 10:15 [name] [name] M Medical Record Number: [id number] Patient Account Number: [id number] Date of Birth/Sex: Treating RN: [birth year] (74 y.o. [name] [name] [name] Primary Care Provider: Pricilla [name] Other Clinician: Referring Provider: Treating Provider/Extender: [name] [name] [name] in Treatment: 2 History of Present Illness HPI Description: 04/09/15; the patient is here for review of wounds on her bilateral lower extremities for the last month. The patient has [name] [name] history of lymphedema and bilateral chronic venous insufficiency with inflammation she was here for review of wounds on her bilateral legs in 2014 she was discharged home with external compression pumps which she is currently not using. Over the last month she developed wounds on her bilateral lower legs with drainage and pain and she is here for our review of this. 04/16/15; the patient's edema is under much better control. She has 3 areas one on the right anterior medial leg one on the left posterior leg and [name] [name] small open area with purulent drainage on the left anterior leg. I have cultured the area on the left anterior leg 04/23/15; continued improvement in the patient's edema. All her wounds of closed I see no open areas. Culture from the left anterior leg last time was negative. READMISSION 02/07/16; this patient is [name] [name] woman that I saw her earlier in 2017. I believe we discharged her very quickly with bilateral wounds in her legs secondary to chronic venous hypertension with inflammation and secondary lymphedema. She had juxtalite stockings. She has external compression pumps at home from [name] [name] stay in our clinic in 2014. She tells me that she's had wounds on her bilateral lower legs since March or April of this year. 3 wounds on the left 2 on the right. She is [name] [name] type II diabetic and has [name] [name]  history of noncompressible vessels bilaterally although she is tolerated 4-layer compression. The last note from her primary physician Dr. Pricilla [name] was in May. At that point Dr. [name] had referred the patient here for lymphedema management. The patient stated she made [name] [name] phone call to year but was not able to arrange an appointment. It is not clear that she is actually using anything on the wounds currently except some gauze that was saturated with drainage per our intake nurse. She is certainly not using her juxtalite stockings and by the patient's own admission she is not using her compression pumps because they are "irritating". I don't see [name] [name] recent hemoglobin A1c. The patient states she has not been systemically unwell but the legs are painful 02/14/16; patient tolerated her Profore wraps reasonably well. She is using the compression pumps 1 time per day. Measurements of her legs are better in terms of the amount of lymphedema 02/21/16; patient tolerated her Profore wraps it. She says she is using her compression pumps one or 2 times [name] [name] day. Measurements of her leg circumference are improved and most of her wound dimensions are better, unfortunately the home health that we referred her to did not except her insurance but works successful in getting her accompanied that would except her insurance however she apparently has [name] [name] $25 co-pay which she cannot afford as she is on [name] [name] fixed income 03/05/16; patient states she did not back using the pumps. She is healed that 2 wounds on the left leg. She still has areas on the right lateral lower leg, right medial lower leg and left lateral lower leg. All of these  look some better and have reduced in area 03/13/16; still 3 wounds 1 on the left lateral leg, one on the left medial/posterior leg which is really the most extensive and [name] [name] small area on the right lateral leg. All of this in the setting of severe chronic lymphedema and chronic venous  inflammation. Damage to the skin with cobblestone thickened skin. 03/20/16; still 2 areas. The area on the left lateral leg appears to of closed over. The right medial leg is still most extensive wound although it appears to be closing over right lateral only [name] [name] small open area remains. Using silver alginate Profore wraps 03/27/16; the area on the left leg remains closed. The edema control is better on the left than the right. The right medial leg is still weeping but everything appears to be epithelialized. The lateral aspect of the right leg appears closed. She tells me she has old juxtalite stockings. She has been compliant with her compression pumps. 04/03/16; the area on the left leg remains closed. She has ordered new juxtalite stockings. The right posterior medial wound is fully epithelialized. However she has an irritated area over the right Achilles that she states is been itching all week under the wraps.. She has been compliant with her compression pumps 04/10/16; the area on the left leg remains closed. The right posterior medial wound is also closed today. The irritated area over the right Achilles which was probably wrap injury is also fully epithelialized and closed. The patient has not received her new juxtalite stockings, she arrives in clinic today with an old juxtalite stocking in place. Sheis using her compression pumps secondary to her severe stage III lymphedema READMISSION 01/21/17; this is [name] [name] patient we know from several previous stays in this clinic. She has lymphedema with chronic venous insufficiency and inflammation. The last time she was here we discharged her with bilateral juxta lites which she is compliant with. She also has external compression pumps at home from stays in our clinic in 2014 although she tells me that she is not very compliant with these as when she uses them [name] [name] causes pain in her bilateral knees the patient states that her wounds on her left greater than  right leg including 2 large areas on the left anterior and left lateral leg and an area on the right leg open in late June or July since then she is been followed by [facility] and she's had bilateral Unna boot supplied every week for the last month.. I think the wounds have actually improved although they are not specifically dressed. She states she had an ultrasound to rule out DVT bilaterally that was negative. She does not have arterial studies although she is [name] [name] diabetic. She states she has [name] [name] lot of pain in her legs she states she does not use her external compression pumps because it causes pain in her knees. As usual her ABIs were noncompressible bilaterally in this clinic 01/28/17; currently the patient only has one small wound on the left lateral leg and I think this should be healed next week. We are going to try to get her bilateral extremitease stockings. She tells me that compression pumps heard her posterior rib arthritic knees so she is not been compliant with this 02/04/17; the patient's wounds are totally healed. She has chronic venous insufficiency and secondary lymphedema. She has new extremitease stockings. She also has compression pumps Readmission: 04/21/17 on evaluation today patient appears to be doing about the same as what she was  previous when we were evaluating her back in November 2018. At that point she had completely healed. With that being said she has now reopened and states that she wasn't aware that she was supposed to come back. 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 Subsequently she also states that although she has the compression that we got for her previously the extremitease stockings she nonetheless doesn't always wear these and being noncompliant often has things will reopen as far as ulcers on her lower extremities. No fevers, chills, nausea, or  vomiting noted at this time. Patient has no evidence of dementia. She is having some discomfort in regard to the ulcers at this point. 04/28/17 on evaluation today patient appears to be doing significantly better in regard to her wounds. With that being said she is still having significant discomfort and is not really keen on sharp debridement. She does not appear to have any evidence of infection which is good news I do think the Iodoflex is beneficial for her. Of note her ABI's were found in the system to be on the right 1.21 with [name] [name] TBI of 0.76 and on the left 1.35 with [name] [name] TBI of 0.75. In general her swelling appears to be significantly improved however. No fevers, chills, nausea, or vomiting noted at this time. Patient has been tolerating the wraps without complication. She has no evidence of dementia. 05/05/17-she is here in follow-up evaluation for bilateral lower extremity venous and lymphedema ulcers. She states she has not been using her lymphedema pumps secondary to pain at her knees. After discussion it was noted that she uses knee-high lymphedema pumps but does have [name] [name] pair of thigh-high lymphedema pumps. She was strongly advised to use the thigh-high lymphedema pumps and if necessary reduce the setting for improved tolerance. She reports no change in odor and/or drainage. She continues to tolerate sharp debridement poorly with significant pain, primarily to the right lower extremity. We will continue with current treatment plan, along with improved lymphedema pump use and follow-up next week 05/12/17 on evaluation today patient actually appears to be doing much better in regard to her bilateral lower extremity ulcers. Only the right altar actually requires debridement today. The other two cleaned off nicely in two of the three in fact on the left lower extremity or actually almost completely healed. She does have some weeping noted still the bilateral lower extremities left greater than right.  She still is not able to use her compression pumps even though I have mentioned this several times to her as did Leah last week. No fevers, chills, nausea, or vomiting noted at this time. Patient has been tolerating the dressing changes without complication. Patient has no evidence of dementia 05/19/17 on evaluation today patient appears to be doing very well regard to her bilateral lower extremity ulcers. In fact she seems to be improving and in fact healed in [name] [name] couple of locations that were giving her trouble last week. Overall I'm extremely pleased with how things have progressed. She still has some discomfort especially in the right lateral lower extremity. Fortunately there does not appear to be any evidence of significant infection which is good news she does tell me that she has used her compression pumps several times although not routinely over the past week I did praise her for this. I do think it will be beneficial and that might even be what's helping  with her wounds as far as healing is concerned at least to [name] [name] degree. No fevers, chills, nausea, or vomiting noted at this time. Patient has no dementia and no evidence of infection. 05/26/17 on evaluation today patient presents for evaluation concerning her bilateral lower extremity ulcer secondary to lymphedema. Fortunately she appears to be doing very well at this point in time. Specifically her right leg appears to be completely closed and healed. The left leg though there is still [name] [name] small area of opening overall has healed. She does have [name] [name] little bit of excoriation at the top or the wraps are because they slid down on her. Otherwise there's no evidence of infection and the other has improved dramatically now that she is not draining as significantly. Patient is having no significant pain she tells me she has been able to use her lymphedema pumps one time [name] [name] day although that's not all she can tolerate. Even that is difficult for her. 06/02/17  on evaluation today patient's ulcers appear to be completely closed at this point. She does still note some odor but I believe this is due to the fact she has not been able to wash her legs as well currently. Fortunately she did bring her compression with her today. She does have the EXTREMIT-EASE Compression. With that being said she is somewhat nervous about going back to this as previously she broke out yet again once we discontinue wrapping her. READMISSION 11/23/17; patient is now 74 year old diabetic woman with severe chronic venous insufficiency with lymphedema. We care for her last in this clinic in February 2019 at which time she had chronic venous insufficiency with lymphedema bilateral lower extremity ulcers. We discharged her with bilateral wrap around/extremitease stockings and compression pumps. Is fairly clear she is not using the compression pumps stating it causes pain in her knees. In any case she was admitted to hospital from 8/3 through 8/6 with [name] [name] several week history of recurrent bilateral ulcers. She was felt to have bilateral cellulitis treated with doxycycline IV. She is still on oral doxycycline and has another 2 days worth. At discharge her white count was 14.1 hemoglobin at 8.9. The patient is [name] [name] diabetic however her last ABIs in 2018 showed [name] [name] right ABI of 1.21, left of 1.35. TBI's were 0.76 on the right 0.75 on the left. Posterior tibial artery was monophasic on the right triphasic on the left. Dorsalis pedis triphasic on the right and triphasic on the left. She is not felt to have significant macrovascular disease 12/02/17;significant bilateral lower extremity wounds secondary to severe uncontrolled lymphedema. She did not get home health out to change the wraps. So she kept this on all week. She is telling me she using his her compression pumps once [name] [name] week. In general all the wounds look better. We've been using silver alginate with secondary absorptive dressings 4 layer  compression 12/10/17; bilateral lower extremity wounds secondary to uncontrolled lymphedema. She tells me she is using her compression pumps on most days. We've been keeping wraps on all week. Using silver alginate. She has wounds on the right lateral calf left medial and left lateral calf. All of this is somewhat better 12/16/17; the patient has 3 small open areas. One on the right lateral calf, one on the left medial calf and the small area on the left lateral calf. She has lymphedema, chronic venous insufficiency with hemosiderin deposition. Very fibrotic distal lower extremity scan with suggestion of an inverted bottle sign appearance to her legs. She has  compression pumps but she does not use them has not used them in the last week. Currently we have her in 4 layer compression, silver alginate to the wounds and this is being changed once by home health. She is noncompliant with the compression pumps because she says it hurts her knees. She also has compression stockings ando Juxta light stockings at home 12/24/17; the patient's right lateral calf is healed as well as the left medial. There is no open wound on the right leg. She still has 2 areas on the left lateral calf. We transitioned the right leg into an extremitease stocking which she brought in from home. 12/31/17; the patient has [name] [name] superficial wound on the left lateral lower calf. We've been wrapping this leg. She arrives today with the right leg swollen but without [name] [name] wound. She cannot get her extremities stocking on the right leg. There is [name] [name] lot more edema here. She is not using her compression pumps 01/10/2018; patient has [name] [name] same superficial wound on the left lateral calf. Her right leg has less peripheral edema. She tells me she also started herself on some Lasix that had been previously prescribed for her at some point but she had never taken it. She is also concerned about whether her compression pumps are leaking. She has extremitease  stockings however the edema gets bad enough she can get these on. Currently she has no open wound on the right leg and is mention the edema is actually better than when I saw this 10 days ago 01/17/2018; patient has [name] [name] same superficial area on the posterior left calf and [name] [name] weeping area in the mid part of the right lateral calf. She still does not have anyone from [facility] coming to see her compression pumps which she describes as being suspicious for [name] [name] leakage. I am not really sure how frequently she is using this in any case. She has massive bilateral calf lymphedema. I think there is some spreading lymphedema into her posterior thighs. 01/24/2018; the patient's edema in her legs is actually some better. She still has [name] [name] smaller area on the left posterior calf although this is better. The weeping area on the right lateral calf is also better. As it turned out she did not get her pumps from [facility] but [facility] and they are going to try to go out and see these this week which is going to help. I explained to her that she will use the compression pumps once [name] [name] day while we are wrapping her legs but when she transitions to stockings that may be she will require pumping twice [name] [name] day. Her compliance in this area has not been high and I wonder about her ability to do this herself. 02/01/2018; the area on the posterior left leg is healed. She still has the original wound on the right lateral calf which is epithelialized. The however just above this there is still weeping lymphedema fluid. She has [name] [name] new area on the upper right calf which is either wrap injury or is she points out where she has been scratching under the wraps. We still not have managed to get [name] [name] hold of [name] [name] representative of [facility] to see if the pump is operational. She has extremitease stockings at home however I am not of the opinion that this will maintain her skin integrity without the compression  pumps. 02/08/2018; posterior left leg remains healed although she has [name] [name] new small open area on the left lateral calf. Right  lateral calf has two quarter sized open areas and close juxtaposition. There is less weeping edema fluid. She apparently managed to contact [facility]. They are sending her [name] [name] part. But she is still not received it 02/15/2018; the patient has no open wound on either leg. Some scale present on the right upper lateral calf just below the fibular head. Edema control is excellent. She did not bring her stockings or her juxta lite wrap around. She has the part for her compression pump but she has not use the compression pumps yet READMISSION 01/10/2019 Patient is now [name] [name] 74 year old woman that we have had in the clinic multiple times in the past. She has chronic lower extremity lymphedema, recurrent wounds on her bilateral lower extremities. She is also [name] [name] type II diabetic reasonably well controlled with [name] [name] recent hemoglobin A1c while she was in the hospital of 5.9. She has wounds on her bilateral lower extremities mostly on the left lateral and right lateral. These were noted also during her recent hospitalization. The patient states they have been there for [name] [name] while. She has not been able to wear her stockings he has trouble getting them on as she lives alone. She also has external compression pumps but uses them sparingly because it causes pain in her knees which hurt because of osteoarthritis. The patient was on doxycycline before she went into the hospital apparently for fear of infection in her legs and is on Keflex coming out because of [name] [name] UTI The patient was recently in the hospital from 01/06/2019 through 01/09/2019. She was admitted with nausea and vomiting and prerenal azotemia. This was corrected with IV fluid her Lasix has been put on hold. Hemoglobin A1c at 5.9 her metformin was discontinued. She was also felt to have [name] [name] UTI she was prescribed Keflex at 503 times [name] [name] day she  is picking that up today. The patient is not felt to have an arterial issue. ABIs in our clinic were 1.24 on the right and 1.06 on the left. She had formal studies in 2018 at which time her ABIs were 1.21 on the right TBI of 0.76 and on the left 1.35 and 0.75 respectively. Waveforms are on the right were monophasic and biphasic, triphasic and biphasic on the left. She is tolerated 4 layer compression in the past 10/13. Comes in today having not used her compression pumps /perhaps once in the last week. She has too much edema to consider healing these wounds. We have been using silver alginate 10/20; she tells me he had she used her compression pumps once [name] [name] day as we asked. Clearly she has edema out of her legs although most of it seems to be settling around her knees which is what she complains about. We have been using silver alginate to the extensive de-epithelialized area on the left lateral calf and [name] [name] much smaller area on the right lateral extending linearly into the right posterior 10/27; she is using her compression pumps daily. Her edema control is better. We have been using silver alginate to the extensive wound areas on her bilateral lower legs. This includes left anterior left lateral and left posterior. Right anterior right lateral and right posterior. The wounds are not all in the same condition. She has severe bilateral skin damage with the epithelialization, flaking dried epithelium 11/3; she is using her compression pumps 1 times daily. Pretty obvious that her wraps fell down especially on the left leg to mid calf. I think things are improving on the right lateral  calf, I do not see anything open posteriorly. The major areas on the left lateral where she has 3 or 4 deeper wounds in the midst of an entire de-epithelialized area. There is erythema here but no tenderness I think this represents venous stasis rather than cellulitis. I debrided the areas on the left lateral calf last week  but once again they have tightly adherent debris over the top of them which is disappointing 11/10; the patient's major areas on the left lateral calf to 3 wounds with some depth surrounded by [name] [name] large area of de-epithelialized tissue. There is no infection. We have been using silver alginate under 4-layer compression. On the right lateral she has [name] [name] more contained wound area that is superficial. Been using silver alginate under 4-layer compression 11/17; the patient's major wound is on the left lateral calf. We have been using silver alginate under compression. There is no open wound per se on the right but it de-epithelialized area on the lateral calf that is weeping edema fluid. I do not see much change here today. 12/1; patient arrives in clinic with her wounds looking better. Her edema control is better. The lymphedema specialist working on the left leg we have been working on the right. We have been using 4-layer compression. The patient is using [name] [name] consider external compression pumps and Hydrofera Blue to the open areas. She is being measured for an abdominal compression pump and apparently that is going to go through her insurance 12/15; continued improvement. 2 small areas on the left and slightly larger 2 small areas on the right. 12/22; patient still has open areas on the right lateral calf which are superficial and the new one on the right anterior calf. She did not use her compression pumps this week. Her left leg is wrapped by our lymphedema specialist. There are no open wounds here 12/29; she still has [name] [name] superficial area on the right lateral calf and area on the right medial calf. Right anterior wound area appears to have closed. She has much better edema control today than last week 04/11/2019; the patient is here with severe bilateral lymphedema, chronic venous insufficiency with marked skin changes in her lower extremities. We are wrapping her right leg and her lymphedema specialist is  putting lymphedema wraps on the left. She states she is using her compression pumps although in the past her compliance with uses been limited because of osteoarthritic pain in her knees. She only has one remaining area on the right leg however she has wrap injuries on the left at the upper level what where I think her wraps were cause. the left lateral area left posterior. These would be new this week 1/12; generally better. The wrap injuries in the upper calf on the left from last week are down to [name] [name] single wound. She has 2 wounds anteriorly that are still weeping fluid although these appear to have been epithelialized surface for the most part. There is no open wounds on the right leg we are wrapping her right leg and 4-layer compression and we are going [name] [name] lymphedema wrap on the left leg. The the approach is [name] [name] [place] 2000 wrap on the left leg and then subsequently the right leg. She claims to be compliant with her compression pumps 1/19; the patient has 1 tiny open area on the right medial calf. The rest of this is not open. She however has [name] [name] large erythematous denuded area on the left anterior tibia extending laterally. There is no evidence  of infection here. She has her new current waist high compression pumps 1/26; the patient has no open wounds on the right. On the left anteriorly much better than last week still [name] [name] smattering of small weeping open areas. There is no evidence of infection 2/2; still no open wounds on the right leg. He still does not have [name] [name] external compression garment. She arrives in clinic today with an open area on the plantar aspect of the left second toe at the base of the PIP. This apparently was painful last week so it was not wrapped by our lymphedema specialist. There was no open wound at the time. The patient denies traumatizing the toe however today she arrives with [name] [name] small but deep wound in this area the dorsal surface of the toe is very tender. 2/11; she arrives in  clinic today with everything epithelialized. She was that way last week on the right but we have still been wrapping. She has been using lymphedema wraps on the left. On careful inspection of the left leg she still has areas that look vulnerable but no clearly open wound there may be some weeping through cracks in the skin. She is using above the waist external compression pumps and she has [name] [name] separate external compression garment for her thighs. The plan is to discharge her for follow-up in lymphedema clinic with Juliann Pulse our lymphedema specialist. In the past healing this woman has resulted in relatively quick breakdown however she generally seems more motivated example willing to use her external compression pumps etc. READMISSION 02/13/2020. [name] [name] [name] is [name] [name] now 74 year old woman who has bilateral lower extremity lymphedema and [name] [name] history of difficult to treat bilateral lower extremity wounds. When she was here last time we had her seen by [name] [name] lymphedema specialist. She had lower extremity compression stockings as well as compression garments for her thighs. She has compression pumps as well. Unfortunately she has not been doing well. She describes unrelenting nausea with vomiting anytime she tries to eat or drink or take her medications. She was seen in urgent care on 10/26. Notably they found her to be somewhat dehydrated. Gave her [name] [name] liter of fluid. Her creatinine was up to 2.42. The rest of her lab work including liver function tests, white count, hemoglobin were all normal. She did have [name] [name] urine for culture that showed Klebsiella she was prescribed ciprofloxacin but I seriously doubt that she is able to keep this down by her description. She has not taken any of her meds in over [name] [name] month. She states she feels too weak to take care of her lower extremities with compression garments and her external compression pumps she has not been using any of these. As [name] [name] result she has 2 large areas on the left lateral  calf one anteriorly [name] [name] cluster of 3 on the right lateral and [name] [name] small area on her right ankle. All of these are superficial. She has poorly controlled lymphedema especially on the anterior tibial areas however by my my anxiety she actually looks like she has less swelling than what I remember. Past medical history includes type 2 diabetes with [name] [name] recent hemoglobin A1c of 6.8 chronic venous insufficiency, lymphedema which she was managing with above the waist compression and compression pumps, diastolic congestive heart failure. Nausea and vomiting as described above. We did not repeat her arterial studies today however these were quite normal when they were last done in 2018 with an ABI of 1.21 on the right and 1.35 on the left.  TBI's were within normal limits at 0.76 and 0.75 she had triphasic waveforms on the left monophasic waveforms at the posterior tibial but triphasic waveforms at the dorsalis pedis. She has previously tolerated 4 layer compression 11/18; the patient has [name] [name] cluster of wounds on the right lateral lower calf and areas on the left lateral calf. We have been using silver alginate under compression she seems to be tolerating this well. Her edema is improved. HOWEVER she arrives in clinic today still with [name] [name] description of bilious vomiting that is making it impossible for her to eat and drink. Her vitals are stable however she looks more gaunt. I have tried to talk her into going to the emergency room to be checked out however she will not hear anything of it 11/29; the patient's right leg is closed. On the left leg she has 2 small superficial open areas anteriorly and one just to the lateral part of the tibia. These are just about closed. She did go to the ER with regards to her persistent nausea and vomiting. Apparently they did not find anything and did [name] [name] CT scan of her abdomen although I have not yet had [name] [name] chance to review these records Electronic Signature(s) Signed: 03/04/2020  4:47:57 PM By: Linton Ham MD Entered By: Linton Ham on 03/04/2020 12:59:01 -------------------------------------------------------------------------------- Physical Exam Details Patient Name: Date of Service: [name] [name] [name], [name] [name] [name] 03/04/2020 10:15 [name] [name] M Medical Record Number: [id number] Patient Account Number: [id number] Date of Birth/Sex: Treating RN: Sep 17, 1945 (74 y.o. [name] [name] [name] Primary Care Provider: Pricilla [name] Other Clinician: Referring Provider: Treating Provider/Extender: [name] [name] [name] in Treatment: 2 Constitutional Sitting or standing Blood Pressure is within target range for patient.. Pulse regular and within target range for patient.Marland Kitchen Respirations regular, non-labored and within target range.. Temperature is normal and within the target range for the patient.Marland Kitchen Appears in no distress but somewhat gaunt. Gastrointestinal (GI) Abdomen is soft and non-distended without masses or tenderness.. Notes Wound exam; we have good edema control in her legs. The right lateral leg wounds are healed. She has 2 superficial areas on the left anterior lower tibia and an area just lateral to this. Both of these look just about closed Electronic Signature(s) Signed: 03/04/2020 4:47:57 PM By: Linton Ham MD Entered By: Linton Ham on 03/04/2020 13:00:00 -------------------------------------------------------------------------------- Physician Orders Details Patient Name: Date of Service: [name] [name] [name], [name] [name] [name] 03/04/2020 10:15 [name] [name] M Medical Record Number: [id number] Patient Account Number: [id number] Date of Birth/Sex: Treating RN: [birth year] (74 y.o. Tonita Phoenix, Lauren Primary Care Provider: Pricilla [name] Other Clinician: Referring Provider: Treating Provider/Extender: [name] [name] [name] in Treatment: 2 Verbal / Phone Orders: No Diagnosis Coding Follow-up Appointments Return Appointment in 1 week. Dressing  Change Frequency Do not change entire dressing for one week. Skin Barriers/Peri-Wound Care TCA Cream or Ointment - mixed with lotion to both legs. Wound Cleansing May shower with protection. - use [name] [name] cast protector. Primary Wound Dressing Wound #42 Left,Anterior Lower Leg Calcium Alginate with Silver Secondary Dressing Dry Gauze ABD pad Edema Control 4 layer compression - Bilateral - first layer of unna boot applied to upper portion of lower legs to aid in securing the wraps. Avoid standing for long periods of time Elevate legs to the level of the heart or above for 30 minutes daily and/or when sitting, [name] [name] frequency of: - 3-4 times [name] [name] day throughout the day. Exercise regularly Segmental Compressive Device. - use lymphedema pumps once [name] [name] day for an hour at [name] [name] time. Electronic  Signature(s) Signed: 03/04/2020 4:47:57 PM By: Linton Ham MD Signed: 03/05/2020 6:12:47 PM By: Levan Hurst RN, BSN Previous Signature: 03/04/2020 11:33:51 AM Version By: Rhae Hammock RN Entered By: Levan Hurst on 03/04/2020 12:06:57 -------------------------------------------------------------------------------- Problem List Details Patient Name: Date of Service: [name] [name] [name], [name] [name] [name] 03/04/2020 10:15 [name] [name] M Medical Record Number: [id number] Patient Account Number: [id number] Date of Birth/Sex: Treating RN: Sep 01, 1945 (74 y.o. [name] [name] [name] Primary Care Provider: Pricilla [name] Other Clinician: Referring Provider: Treating Provider/Extender: [name] [name] [name] in Treatment: 2 Active Problems ICD-10 Encounter Code Description Active Date MDM Diagnosis [phone] Non-pressure chronic ulcer of other part of left lower leg limited to breakdown 02/13/2020 No Yes of skin L97.811 Non-pressure chronic ulcer of other part of right lower leg limited to breakdown 02/13/2020 No Yes of skin I89.0 Lymphedema, not elsewhere classified 02/13/2020 No Yes E86.0 Dehydration 02/13/2020 No  Yes R11.2 Nausea with vomiting, unspecified 02/13/2020 No Yes Inactive Problems Resolved Problems Electronic Signature(s) Signed: 03/04/2020 4:47:57 PM By: Linton Ham MD Entered By: Linton Ham on 03/04/2020 12:58:04 -------------------------------------------------------------------------------- Progress Note Details Patient Name: Date of Service: [name] [name] [name], [name] [name] [name] 03/04/2020 10:15 [name] [name] M Medical Record Number: [id number] Patient Account Number: [id number] Date of Birth/Sex: Treating RN: May 27, 1945 (74 y.o. [name] [name] [name] Primary Care Provider: Pricilla [name] Other Clinician: Referring Provider: Treating Provider/Extender: [name] [name] [name] in Treatment: 2 Subjective History of Present Illness (HPI) 04/09/15; the patient is here for review of wounds on her bilateral lower extremities for the last month. The patient has [name] [name] history of lymphedema and bilateral chronic venous insufficiency with inflammation she was here for review of wounds on her bilateral legs in 2014 she was discharged home with external compression pumps which she is currently not using. Over the last month she developed wounds on her bilateral lower legs with drainage and pain and she is here for our review of this. 04/16/15; the patient's edema is under much better control. She has 3 areas one on the right anterior medial leg one on the left posterior leg and [name] [name] small open area with purulent drainage on the left anterior leg. I have cultured the area on the left anterior leg 04/23/15; continued improvement in the patient's edema. All her wounds of closed I see no open areas. Culture from the left anterior leg last time was negative. READMISSION 02/07/16; this patient is [name] [name] woman that I saw her earlier in 2017. I believe we discharged her very quickly with bilateral wounds in her legs secondary to chronic venous hypertension with inflammation and secondary lymphedema. She had juxtalite  stockings. She has external compression pumps at home from [name] [name] stay in our clinic in 2014. She tells me that she's had wounds on her bilateral lower legs since March or April of this year. 3 wounds on the left 2 on the right. She is [name] [name] type II diabetic and has [name] [name] history of noncompressible vessels bilaterally although she is tolerated 4-layer compression. The last note from her primary physician Dr. Pricilla [name] was in May. At that point Dr. [name] had referred the patient here for lymphedema management. The patient stated she made [name] [name] phone call to year but was not able to arrange an appointment. It is not clear that she is actually using anything on the wounds currently except some gauze that was saturated with drainage per our intake nurse. She is certainly not using her juxtalite stockings and by the patient's own admission she is not using her compression pumps because they  are "irritating". I don't see [name] [name] recent hemoglobin A1c. The patient states she has not been systemically unwell but the legs are painful 02/14/16; patient tolerated her Profore wraps reasonably well. She is using the compression pumps 1 time per day. Measurements of her legs are better in terms of the amount of lymphedema 02/21/16; patient tolerated her Profore wraps it. She says she is using her compression pumps one or 2 times [name] [name] day. Measurements of her leg circumference are improved and most of her wound dimensions are better, unfortunately the home health that we referred her to did not except her insurance but works successful in getting her accompanied that would except her insurance however she apparently has [name] [name] $25 co-pay which she cannot afford as she is on [name] [name] fixed income 03/05/16; patient states she did not back using the pumps. She is healed that 2 wounds on the left leg. She still has areas on the right lateral lower leg, right medial lower leg and left lateral lower leg. All of these look some better and have  reduced in area 03/13/16; still 3 wounds 1 on the left lateral leg, one on the left medial/posterior leg which is really the most extensive and [name] [name] small area on the right lateral leg. All of this in the setting of severe chronic lymphedema and chronic venous inflammation. Damage to the skin with cobblestone thickened skin. 03/20/16; still 2 areas. The area on the left lateral leg appears to of closed over. The right medial leg is still most extensive wound although it appears to be closing over right lateral only [name] [name] small open area remains. Using silver alginate Profore wraps 03/27/16; the area on the left leg remains closed. The edema control is better on the left than the right. The right medial leg is still weeping but everything appears to be epithelialized. The lateral aspect of the right leg appears closed. She tells me she has old juxtalite stockings. She has been compliant with her compression pumps. 04/03/16; the area on the left leg remains closed. She has ordered new juxtalite stockings. The right posterior medial wound is fully epithelialized. However she has an irritated area over the right Achilles that she states is been itching all week under the wraps.. She has been compliant with her compression pumps 04/10/16; the area on the left leg remains closed. The right posterior medial wound is also closed today. The irritated area over the right Achilles which was probably wrap injury is also fully epithelialized and closed. The patient has not received her new juxtalite stockings, she arrives in clinic today with an old juxtalite stocking in place. Sheis using her compression pumps secondary to her severe stage III lymphedema READMISSION 01/21/17; this is [name] [name] patient we know from several previous stays in this clinic. She has lymphedema with chronic venous insufficiency and inflammation. The last time she was here we discharged her with bilateral juxta lites which she is compliant with. She also  has external compression pumps at home from stays in our clinic in 2014 although she tells me that she is not very compliant with these as when she uses them [name] [name] causes pain in her bilateral knees the patient states that her wounds on her left greater than right leg including 2 large areas on the left anterior and left lateral leg and an area on the right leg open in late June or July since then she is been followed by [facility] and she's had bilateral Unna boot supplied every week  for the last month.. I think the wounds have actually improved although they are not specifically dressed. She states she had an ultrasound to rule out DVT bilaterally that was negative. She does not have arterial studies although she is [name] [name] diabetic. She states she has [name] [name] lot of pain in her legs she states she does not use her external compression pumps because it causes pain in her knees. As usual her ABIs were noncompressible bilaterally in this clinic 01/28/17; currently the patient only has one small wound on the left lateral leg and I think this should be healed next week. We are going to try to get her bilateral extremitease stockings. She tells me that compression pumps heard her posterior rib arthritic knees so she is not been compliant with this 02/04/17; the patient's wounds are totally healed. She has chronic venous insufficiency and secondary lymphedema. She has new extremitease stockings. She also has compression pumps Readmission: 04/21/17 on evaluation today patient appears to be doing about the same as what she was previous when we were evaluating her back in November 2018. At that point she had completely healed. With that being said she has now reopened and states that she wasn't aware that she was supposed to come back. 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 Subsequently she also states that  although she has the compression that we got for her previously the extremitease stockings she nonetheless doesn't always wear these and being noncompliant often has things will reopen as far as ulcers on her lower extremities. No fevers, chills, nausea, or vomiting noted at this time. Patient has no evidence of dementia. She is having some discomfort in regard to the ulcers at this point. 04/28/17 on evaluation today patient appears to be doing significantly better in regard to her wounds. With that being said she is still having significant discomfort and is not really keen on sharp debridement. She does not appear to have any evidence of infection which is good news I do think the Iodoflex is beneficial for her. Of note her ABI's were found in the system to be on the right 1.21 with [name] [name] TBI of 0.76 and on the left 1.35 with [name] [name] TBI of 0.75. In general her swelling appears to be significantly improved however. No fevers, chills, nausea, or vomiting noted at this time. Patient has been tolerating the wraps without complication. She has no evidence of dementia. 05/05/17-she is here in follow-up evaluation for bilateral lower extremity venous and lymphedema ulcers. She states she has not been using her lymphedema pumps secondary to pain at her knees. After discussion it was noted that she uses knee-high lymphedema pumps but does have [name] [name] pair of thigh-high lymphedema pumps. She was strongly advised to use the thigh-high lymphedema pumps and if necessary reduce the setting for improved tolerance. She reports no change in odor and/or drainage. She continues to tolerate sharp debridement poorly with significant pain, primarily to the right lower extremity. We will continue with current treatment plan, along with improved lymphedema pump use and follow-up next week 05/12/17 on evaluation today patient actually appears to be doing much better in regard to her bilateral lower extremity ulcers. Only the right altar  actually requires debridement today. The other two cleaned off nicely in two of the three in fact on the left lower extremity or actually almost completely healed. She does have some weeping noted still  the bilateral lower extremities left greater than right. She still is not able to use her compression pumps even though I have mentioned this several times to her as did Leah last week. No fevers, chills, nausea, or vomiting noted at this time. Patient has been tolerating the dressing changes without complication. Patient has no evidence of dementia 05/19/17 on evaluation today patient appears to be doing very well regard to her bilateral lower extremity ulcers. In fact she seems to be improving and in fact healed in [name] [name] couple of locations that were giving her trouble last week. Overall I'm extremely pleased with how things have progressed. She still has some discomfort especially in the right lateral lower extremity. Fortunately there does not appear to be any evidence of significant infection which is good news she does tell me that she has used her compression pumps several times although not routinely over the past week I did praise her for this. I do think it will be beneficial and that might even be what's helping with her wounds as far as healing is concerned at least to [name] [name] degree. No fevers, chills, nausea, or vomiting noted at this time. Patient has no dementia and no evidence of infection. 05/26/17 on evaluation today patient presents for evaluation concerning her bilateral lower extremity ulcer secondary to lymphedema. Fortunately she appears to be doing very well at this point in time. Specifically her right leg appears to be completely closed and healed. The left leg though there is still [name] [name] small area of opening overall has healed. She does have [name] [name] little bit of excoriation at the top or the wraps are because they slid down on her. Otherwise there's no evidence of infection and the other has  improved dramatically now that she is not draining as significantly. Patient is having no significant pain she tells me she has been able to use her lymphedema pumps one time [name] [name] day although that's not all she can tolerate. Even that is difficult for her. 06/02/17 on evaluation today patient's ulcers appear to be completely closed at this point. She does still note some odor but I believe this is due to the fact she has not been able to wash her legs as well currently. Fortunately she did bring her compression with her today. She does have the EXTREMIT-EASE Compression. With that being said she is somewhat nervous about going back to this as previously she broke out yet again once we discontinue wrapping her. READMISSION 11/23/17; patient is now 74 year old diabetic woman with severe chronic venous insufficiency with lymphedema. We care for her last in this clinic in February 2019 at which time she had chronic venous insufficiency with lymphedema bilateral lower extremity ulcers. We discharged her with bilateral wrap around/extremitease stockings and compression pumps. Is fairly clear she is not using the compression pumps stating it causes pain in her knees. In any case she was admitted to hospital from 8/3 through 8/6 with [name] [name] several week history of recurrent bilateral ulcers. She was felt to have bilateral cellulitis treated with doxycycline IV. She is still on oral doxycycline and has another 2 days worth. At discharge her white count was 14.1 hemoglobin at 8.9. The patient is [name] [name] diabetic however her last ABIs in 2018 showed [name] [name] right ABI of 1.21, left of 1.35. TBI's were 0.76 on the right 0.75 on the left. Posterior tibial artery was monophasic on the right triphasic on the left. Dorsalis pedis triphasic on the right and triphasic on the left. She is  not felt to have significant macrovascular disease 12/02/17;significant bilateral lower extremity wounds secondary to severe uncontrolled lymphedema. She did  not get home health out to change the wraps. So she kept this on all week. She is telling me she using his her compression pumps once [name] [name] week. In general all the wounds look better. We've been using silver alginate with secondary absorptive dressings 4 layer compression 12/10/17; bilateral lower extremity wounds secondary to uncontrolled lymphedema. She tells me she is using her compression pumps on most days. We've been keeping wraps on all week. Using silver alginate. She has wounds on the right lateral calf left medial and left lateral calf. All of this is somewhat better 12/16/17; the patient has 3 small open areas. One on the right lateral calf, one on the left medial calf and the small area on the left lateral calf. She has lymphedema, chronic venous insufficiency with hemosiderin deposition. Very fibrotic distal lower extremity scan with suggestion of an inverted bottle sign appearance to her legs. She has compression pumps but she does not use them has not used them in the last week. Currently we have her in 4 layer compression, silver alginate to the wounds and this is being changed once by home health. She is noncompliant with the compression pumps because she says it hurts her knees. She also has compression stockings ando Juxta light stockings at home 12/24/17; the patient's right lateral calf is healed as well as the left medial. There is no open wound on the right leg. She still has 2 areas on the left lateral calf. We transitioned the right leg into an extremitease stocking which she brought in from home. 12/31/17; the patient has [name] [name] superficial wound on the left lateral lower calf. We've been wrapping this leg. She arrives today with the right leg swollen but without [name] [name] wound. She cannot get her extremities stocking on the right leg. There is [name] [name] lot more edema here. She is not using her compression pumps 01/10/2018; patient has [name] [name] same superficial wound on the left lateral calf. Her right leg has  less peripheral edema. She tells me she also started herself on some Lasix that had been previously prescribed for her at some point but she had never taken it. She is also concerned about whether her compression pumps are leaking. She has extremitease stockings however the edema gets bad enough she can get these on. Currently she has no open wound on the right leg and is mention the edema is actually better than when I saw this 10 days ago 01/17/2018; patient has [name] [name] same superficial area on the posterior left calf and [name] [name] weeping area in the mid part of the right lateral calf. She still does not have anyone from [facility] coming to see her compression pumps which she describes as being suspicious for [name] [name] leakage. I am not really sure how frequently she is using this in any case. She has massive bilateral calf lymphedema. I think there is some spreading lymphedema into her posterior thighs. 01/24/2018; the patient's edema in her legs is actually some better. She still has [name] [name] smaller area on the left posterior calf although this is better. The weeping area on the right lateral calf is also better. As it turned out she did not get her pumps from [facility] but [facility] and they are going to try to go out and see these this week which is going to help. I explained to her that she will use the  compression pumps once [name] [name] day while we are wrapping her legs but when she transitions to stockings that may be she will require pumping twice [name] [name] day. Her compliance in this area has not been high and I wonder about her ability to do this herself. 02/01/2018; the area on the posterior left leg is healed. She still has the original wound on the right lateral calf which is epithelialized. The however just above this there is still weeping lymphedema fluid. She has [name] [name] new area on the upper right calf which is either wrap injury or is she points out where she has been scratching under the wraps. We still  not have managed to get [name] [name] hold of [name] [name] representative of [facility] to see if the pump is operational. She has extremitease stockings at home however I am not of the opinion that this will maintain her skin integrity without the compression pumps. 02/08/2018; posterior left leg remains healed although she has [name] [name] new small open area on the left lateral calf. Right lateral calf has two quarter sized open areas and close juxtaposition. There is less weeping edema fluid. She apparently managed to contact [facility]. They are sending her [name] [name] part. But she is still not received it 02/15/2018; the patient has no open wound on either leg. Some scale present on the right upper lateral calf just below the fibular head. Edema control is excellent. She did not bring her stockings or her juxta lite wrap around. She has the part for her compression pump but she has not use the compression pumps yet READMISSION 01/10/2019 Patient is now [name] [name] 74 year old woman that we have had in the clinic multiple times in the past. She has chronic lower extremity lymphedema, recurrent wounds on her bilateral lower extremities. She is also [name] [name] type II diabetic reasonably well controlled with [name] [name] recent hemoglobin A1c while she was in the hospital of 5.9. She has wounds on her bilateral lower extremities mostly on the left lateral and right lateral. These were noted also during her recent hospitalization. The patient states they have been there for [name] [name] while. She has not been able to wear her stockings he has trouble getting them on as she lives alone. She also has external compression pumps but uses them sparingly because it causes pain in her knees which hurt because of osteoarthritis. The patient was on doxycycline before she went into the hospital apparently for fear of infection in her legs and is on Keflex coming out because of [name] [name] UTI The patient was recently in the hospital from 01/06/2019 through 01/09/2019. She was admitted with  nausea and vomiting and prerenal azotemia. This was corrected with IV fluid her Lasix has been put on hold. Hemoglobin A1c at 5.9 her metformin was discontinued. She was also felt to have [name] [name] UTI she was prescribed Keflex at 503 times [name] [name] day she is picking that up today. The patient is not felt to have an arterial issue. ABIs in our clinic were 1.24 on the right and 1.06 on the left. She had formal studies in 2018 at which time her ABIs were 1.21 on the right TBI of 0.76 and on the left 1.35 and 0.75 respectively. Waveforms are on the right were monophasic and biphasic, triphasic and biphasic on the left. She is tolerated 4 layer compression in the past 10/13. Comes in today having not used her compression pumps /perhaps once in the last week. She has too much edema to consider healing these wounds. We have been  using silver alginate 10/20; she tells me he had she used her compression pumps once [name] [name] day as we asked. Clearly she has edema out of her legs although most of it seems to be settling around her knees which is what she complains about. We have been using silver alginate to the extensive de-epithelialized area on the left lateral calf and [name] [name] much smaller area on the right lateral extending linearly into the right posterior 10/27; she is using her compression pumps daily. Her edema control is better. We have been using silver alginate to the extensive wound areas on her bilateral lower legs. This includes left anterior left lateral and left posterior. Right anterior right lateral and right posterior. The wounds are not all in the same condition. She has severe bilateral skin damage with the epithelialization, flaking dried epithelium 11/3; she is using her compression pumps 1 times daily. Pretty obvious that her wraps fell down especially on the left leg to mid calf. I think things are improving on the right lateral calf, I do not see anything open posteriorly. The major areas on the left lateral  where she has 3 or 4 deeper wounds in the midst of an entire de-epithelialized area. There is erythema here but no tenderness I think this represents venous stasis rather than cellulitis. I debrided the areas on the left lateral calf last week but once again they have tightly adherent debris over the top of them which is disappointing 11/10; the patient's major areas on the left lateral calf to 3 wounds with some depth surrounded by [name] [name] large area of de-epithelialized tissue. There is no infection. We have been using silver alginate under 4-layer compression. On the right lateral she has [name] [name] more contained wound area that is superficial. Been using silver alginate under 4-layer compression 11/17; the patient's major wound is on the left lateral calf. We have been using silver alginate under compression. There is no open wound per se on the right but it de-epithelialized area on the lateral calf that is weeping edema fluid. I do not see much change here today. 12/1; patient arrives in clinic with her wounds looking better. Her edema control is better. The lymphedema specialist working on the left leg we have been working on the right. We have been using 4-layer compression. The patient is using [name] [name] consider external compression pumps and Hydrofera Blue to the open areas. She is being measured for an abdominal compression pump and apparently that is going to go through her insurance 12/15; continued improvement. 2 small areas on the left and slightly larger 2 small areas on the right. 12/22; patient still has open areas on the right lateral calf which are superficial and the new one on the right anterior calf. She did not use her compression pumps this week. Her left leg is wrapped by our lymphedema specialist. There are no open wounds here 12/29; she still has [name] [name] superficial area on the right lateral calf and area on the right medial calf. Right anterior wound area appears to have closed. She has much better  edema control today than last week 04/11/2019; the patient is here with severe bilateral lymphedema, chronic venous insufficiency with marked skin changes in her lower extremities. We are wrapping her right leg and her lymphedema specialist is putting lymphedema wraps on the left. She states she is using her compression pumps although in the past her compliance with uses been limited because of osteoarthritic pain in her knees. She only has one remaining  area on the right leg however she has wrap injuries on the left at the upper level what where I think her wraps were cause. the left lateral area left posterior. These would be new this week 1/12; generally better. The wrap injuries in the upper calf on the left from last week are down to [name] [name] single wound. She has 2 wounds anteriorly that are still weeping fluid although these appear to have been epithelialized surface for the most part. There is no open wounds on the right leg we are wrapping her right leg and 4-layer compression and we are going [name] [name] lymphedema wrap on the left leg. The the approach is [name] [name] [place] 2000 wrap on the left leg and then subsequently the right leg. She claims to be compliant with her compression pumps 1/19; the patient has 1 tiny open area on the right medial calf. The rest of this is not open. She however has [name] [name] large erythematous denuded area on the left anterior tibia extending laterally. There is no evidence of infection here. She has her new current waist high compression pumps 1/26; the patient has no open wounds on the right. On the left anteriorly much better than last week still [name] [name] smattering of small weeping open areas. There is no evidence of infection 2/2; still no open wounds on the right leg. He still does not have [name] [name] external compression garment. She arrives in clinic today with an open area on the plantar aspect of the left second toe at the base of the PIP. This apparently was painful last week so it was not wrapped  by our lymphedema specialist. There was no open wound at the time. The patient denies traumatizing the toe however today she arrives with [name] [name] small but deep wound in this area the dorsal surface of the toe is very tender. 2/11; she arrives in clinic today with everything epithelialized. She was that way last week on the right but we have still been wrapping. She has been using lymphedema wraps on the left. On careful inspection of the left leg she still has areas that look vulnerable but no clearly open wound there may be some weeping through cracks in the skin. She is using above the waist external compression pumps and she has [name] [name] separate external compression garment for her thighs. The plan is to discharge her for follow-up in lymphedema clinic with Juliann Pulse our lymphedema specialist. In the past healing this woman has resulted in relatively quick breakdown however she generally seems more motivated example willing to use her external compression pumps etc. READMISSION 02/13/2020. [name] [name] [name] is [name] [name] now 74 year old woman who has bilateral lower extremity lymphedema and [name] [name] history of difficult to treat bilateral lower extremity wounds. When she was here last time we had her seen by [name] [name] lymphedema specialist. She had lower extremity compression stockings as well as compression garments for her thighs. She has compression pumps as well. Unfortunately she has not been doing well. She describes unrelenting nausea with vomiting anytime she tries to eat or drink or take her medications. She was seen in urgent care on 10/26. Notably they found her to be somewhat dehydrated. Gave her [name] [name] liter of fluid. Her creatinine was up to 2.42. The rest of her lab work including liver function tests, white count, hemoglobin were all normal. She did have [name] [name] urine for culture that showed Klebsiella she was prescribed ciprofloxacin but I seriously doubt that she is able to keep this down by her description. She has not  taken any of  her meds in over [name] [name] month. She states she feels too weak to take care of her lower extremities with compression garments and her external compression pumps she has not been using any of these. As [name] [name] result she has 2 large areas on the left lateral calf one anteriorly [name] [name] cluster of 3 on the right lateral and [name] [name] small area on her right ankle. All of these are superficial. She has poorly controlled lymphedema especially on the anterior tibial areas however by my my anxiety she actually looks like she has less swelling than what I remember. Past medical history includes type 2 diabetes with [name] [name] recent hemoglobin A1c of 6.8 chronic venous insufficiency, lymphedema which she was managing with above the waist compression and compression pumps, diastolic congestive heart failure. Nausea and vomiting as described above. We did not repeat her arterial studies today however these were quite normal when they were last done in 2018 with an ABI of 1.21 on the right and 1.35 on the left. TBI's were within normal limits at 0.76 and 0.75 she had triphasic waveforms on the left monophasic waveforms at the posterior tibial but triphasic waveforms at the dorsalis pedis. She has previously tolerated 4 layer compression 11/18; the patient has [name] [name] cluster of wounds on the right lateral lower calf and areas on the left lateral calf. We have been using silver alginate under compression she seems to be tolerating this well. Her edema is improved. HOWEVER she arrives in clinic today still with [name] [name] description of bilious vomiting that is making it impossible for her to eat and drink. Her vitals are stable however she looks more gaunt. I have tried to talk her into going to the emergency room to be checked out however she will not hear anything of it 11/29; the patient's right leg is closed. On the left leg she has 2 small superficial open areas anteriorly and one just to the lateral part of the tibia. These are just about closed. She did  go to the ER with regards to her persistent nausea and vomiting. Apparently they did not find anything and did [name] [name] CT scan of her abdomen although I have not yet had [name] [name] chance to review these records Objective Constitutional Sitting or standing Blood Pressure is within target range for patient.. Pulse regular and within target range for patient.Marland Kitchen Respirations regular, non-labored and within target range.. Temperature is normal and within the target range for the patient.Marland Kitchen Appears in no distress but somewhat gaunt. Vitals Time Taken: 10:40 AM, Height: 71 in, Weight: 260 lbs, BMI: 36.3, Temperature: 98.6 F, Pulse: 93 bpm, Respiratory Rate: 20 breaths/min, Blood Pressure: 130/81 mmHg. Gastrointestinal (GI) Abdomen is soft and non-distended without masses or tenderness.. General Notes: Wound exam; we have good edema control in her legs. The right lateral leg wounds are healed. She has 2 superficial areas on the left anterior lower tibia and an area just lateral to this. Both of these look just about closed Integumentary (Hair, Skin) Wound #42 status is Open. Original cause of wound was Gradually Appeared. The wound is located on the Left,Anterior Lower Leg. The wound measures 0.5cm length x 0.5cm width x 0.1cm depth; 0.196cm^2 area and 0.02cm^3 volume. There is Fat Layer (Subcutaneous Tissue) exposed. There is no tunneling or undermining noted. There is [name] [name] medium amount of serosanguineous drainage noted. The wound margin is flat and intact. There is large (67-100%) red, pink granulation within the wound bed. There is no necrotic tissue within the  wound bed. Wound #43 status is Open. Original cause of wound was Gradually Appeared. The wound is located on the Left,Lateral Lower Leg. The wound measures 0cm length x 0cm width x 0cm depth; 0cm^2 area and 0cm^3 volume. There is no tunneling or undermining noted. There is [name] [name] none present amount of drainage noted. The wound margin is flat and intact. There is no  granulation within the wound bed. There is no necrotic tissue within the wound bed. Wound #44 status is Open. Original cause of wound was Gradually Appeared. The wound is located on the Right,Lateral Lower Leg. The wound measures 0cm length x 0cm width x 0cm depth; 0cm^2 area and 0cm^3 volume. There is no tunneling or undermining noted. There is [name] [name] none present amount of drainage noted. The wound margin is flat and intact. There is no granulation within the wound bed. There is no necrotic tissue within the wound bed. Wound #45 status is Open. Original cause of wound was Gradually Appeared. The wound is located on the Left,Distal,Lateral Lower Leg. The wound measures 0cm length x 0cm width x 0cm depth; 0cm^2 area and 0cm^3 volume. There is no tunneling or undermining noted. There is [name] [name] none present amount of drainage noted. The wound margin is flat and intact. There is no granulation within the wound bed. There is no necrotic tissue within the wound bed. Wound #46 status is Open. Original cause of wound was Gradually Appeared. The wound is located on the Right,Distal,Lateral Lower Leg. The wound measures 0cm length x 0cm width x 0cm depth; 0cm^2 area and 0cm^3 volume. There is no tunneling or undermining noted. There is [name] [name] none present amount of drainage noted. The wound margin is flat and intact. There is no granulation within the wound bed. There is no necrotic tissue within the wound bed. Assessment Active Problems ICD-10 Non-pressure chronic ulcer of other part of left lower leg limited to breakdown of skin Non-pressure chronic ulcer of other part of right lower leg limited to breakdown of skin Lymphedema, not elsewhere classified Dehydration Nausea with vomiting, unspecified Procedures Wound #42 Pre-procedure diagnosis of Wound #42 is [name] [name] Lymphedema located on the Left,Anterior Lower Leg . There was [name] [name] Four Layer Compression Therapy Procedure by Levan Hurst, RN. Post procedure Diagnosis Wound  #42: Same as Pre-Procedure There was [name] [name] Four Layer Compression Therapy Procedure by Levan Hurst, RN. Post procedure Diagnosis Wound #: Same as Pre-Procedure Plan Follow-up Appointments: Return Appointment in 1 week. Dressing Change Frequency: Do not change entire dressing for one week. Skin Barriers/Peri-Wound Care: TCA Cream or Ointment - mixed with lotion to both legs. Wound Cleansing: May shower with protection. - use [name] [name] cast protector. Primary Wound Dressing: Wound #42 Left,Anterior Lower Leg: Calcium Alginate with Silver Secondary Dressing: Dry Gauze ABD pad Edema Control: 4 layer compression - Bilateral - first layer of unna boot applied to upper portion of lower legs to aid in securing the wraps. Avoid standing for long periods of time Elevate legs to the level of the heart or above for 30 minutes daily and/or when sitting, [name] [name] frequency of: - 3-4 times [name] [name] day throughout the day. Exercise regularly Segmental Compressive Device. - use lymphedema pumps once [name] [name] day for an hour at [name] [name] time. 1. I put silver alginate on the 2 small remaining wounds on the left leg. Again under 4-layer compression 2. 4 layer compression on the right leg as well although she did not have an open wound 3. I have asked her to bring her stockings  next week she apparently has juxta lites as well as over the toe stockings. Electronic Signature(s) Signed: 03/04/2020 4:47:57 PM By: Linton Ham MD Entered By: Linton Ham on 03/04/2020 13:00:43 -------------------------------------------------------------------------------- SuperBill Details Patient Name: Date of Service: NAKENYA, THEALL 03/04/2020 Medical Record Number: [id number] Patient Account Number: [id number] Date of Birth/Sex: Treating RN: Nov 14, 1945 (74 y.o. [name] [name] [name] Primary Care Provider: Pricilla [name] Other Clinician: Referring Provider: Treating Provider/Extender: [name] [name] [name] in Treatment:  2 Diagnosis Coding ICD-10 Codes Code Description [phone] Non-pressure chronic ulcer of other part of left lower leg limited to breakdown of skin L97.811 Non-pressure chronic ulcer of other part of right lower leg limited to breakdown of skin I89.0 Lymphedema, not elsewhere classified E86.0 Dehydration R11.2 Nausea with vomiting, unspecified Facility Procedures CPT4: Code [id number] Description: 81 BILATERAL: Application of multi-layer venous compression system; leg (below knee), including ankle and t. Modifier: Quantity: 1 Physician Procedures : CPT4 Code Description Modifier [id number] - WC PHYS LEVEL 3 - EST PT ICD-10 Diagnosis Description L97.821 Non-pressure chronic ulcer of other part of left lower leg limited to breakdown of skin L97.811 Non-pressure chronic ulcer of other part of  right lower leg limited to breakdown of skin I89.0 Lymphedema, not elsewhere classified Quantity: 1 Electronic Signature(s) Signed: 03/04/2020 4:47:57 PM By: Linton Ham MD Entered By: Linton Ham on 03/04/2020 13:01:01

## 2020-03-05 NOTE — Progress Notes (Signed)
SHAKERRA, RED ([id number]) Visit Report for 03/04/2020 Fall Risk Assessment Details Patient Name: Date of Service: ZAKYA, HALABI 03/04/2020 10:15 A M Medical Record Number: [id number] Patient Account Number: [id number] Date of Birth/Sex: Treating RN: 1945/09/24 (74 y.o. [name] Primary Care Adriella Essex: [name] Other Clinician: Referring Santiel Topper: Treating Ajax Schroll/Extender: [name] in Treatment: 2 Fall Risk Assessment Items Have you had 2 or more falls in the last 12 monthso 0 No Have you had any fall that resulted in injury in the last 12 monthso 0 No FALLS RISK SCREEN History of falling - immediate or within 3 months 25 Yes Secondary diagnosis (Do you have 2 or more medical diagnoseso) 0 No Ambulatory aid None/bed rest/wheelchair/nurse 0 No Crutches/cane/walker 0 No Furniture 0 No Intravenous therapy Access/Saline/Heparin Lock 0 No Gait/Transferring Normal/ bed rest/ wheelchair 0 No Weak (short steps with or without shuffle, stooped but able to lift head while walking, may seek 0 No support from furniture) Impaired (short steps with shuffle, may have difficulty arising from chair, head down, impaired 0 No balance) Mental Status Oriented to own ability 0 No Electronic Signature(s) Signed: 03/05/2020 5:56:00 PM By: Carlene Coria RN Entered By: Carlene Coria on 03/04/2020 11:04:11

## 2020-03-05 NOTE — Progress Notes (Signed)
[name] [name], [name] [name] ([id number]) Visit Report for 03/04/2020 Arrival Information Details Patient Name: Date of Service: [name] [name], [name] [name] 03/04/2020 10:15 [name] M Medical Record Number: [id number] Patient Account Number: [id number] Date of Birth/Sex: Treating RN: Aug 05, 1945 (74 y.o. [name] Primary Care Zekiah Coen: [name] Other Clinician: Referring Winslow Ederer: Treating Thena Devora/Extender: [name] in Treatment: 2 Visit Information History Since Last Visit All ordered tests and consults were completed: No Patient Arrived: Ambulatory Added or deleted any medications: No Arrival Time: 10:39 Any new allergies or adverse reactions: No Accompanied By: self Had [name] fall or experienced change in No Transfer Assistance: None activities of daily living that may affect Patient Identification Verified: Yes risk of falls: Secondary Verification Process Completed: Yes Signs or symptoms of abuse/neglect since last visito No Patient Requires Transmission-Based Precautions: No Hospitalized since last visit: No Patient Has Alerts: Yes Has Dressing in Place as Prescribed: Yes Patient Alerts: R ABI= 1.21, L ABI= 1.35 Has Compression in Place as Prescribed: Yes Pain Present Now: No Electronic Signature(s) Signed: 03/05/2020 5:56:00 PM By: Carlene Coria RN Entered By: Carlene Coria on 03/04/2020 10:40:14 -------------------------------------------------------------------------------- Compression Therapy Details Patient Name: Date of Service: [name] [name], [name] [name] 03/04/2020 10:15 [name] M Medical Record Number: [id number] Patient Account Number: [id number] Date of Birth/Sex: Treating RN: 02-24-1946 (74 y.o. [name] Primary Care Leanah Kolander: [name] Other Clinician: Referring Surena Welge: Treating Zuzanna Maroney/Extender: [name] in Treatment: 2 Compression Therapy Performed for Wound Assessment: Wound #42 Left,Anterior  Lower Leg Performed By: Clinician Levan Hurst, RN Compression Type: Four Layer Post Procedure Diagnosis Same as Pre-procedure Electronic Signature(s) Signed: 03/05/2020 6:12:47 PM By: Levan Hurst RN, BSN Entered By: Levan Hurst on 03/04/2020 12:05:48 -------------------------------------------------------------------------------- Compression Therapy Details Patient Name: Date of Service: [name] [name], [name] [name] 03/04/2020 10:15 [name] M Medical Record Number: [id number] Patient Account Number: [id number] Date of Birth/Sex: Treating RN: September 07, 1945 (75 y.o. [name] Primary Care Caellum Mancil: [name] Other Clinician: Referring Ewin Rehberg: Treating Loyola Santino/Extender: [name] in Treatment: 2 Compression Therapy Performed for Wound Assessment: NonWound Condition Lymphedema - Right Leg Performed By: Clinician Levan Hurst, RN Compression Type: Four Layer Post Procedure Diagnosis Same as Pre-procedure Electronic Signature(s) Signed: 03/05/2020 6:12:47 PM By: Levan Hurst RN, BSN Entered By: Levan Hurst on 03/04/2020 12:06:15 -------------------------------------------------------------------------------- Encounter Discharge Information Details Patient Name: Date of Service: [name] [name], [name] [name] 03/04/2020 10:15 [name] M Medical Record Number: [id number] Patient Account Number: [id number] Date of Birth/Sex: Treating RN: 08/13/1945 (74 y.o. Debby Bud Primary Care Makell Drohan: [name] Other Clinician: Referring Elaisha Zahniser: Treating Keitra Carusone/Extender: [name] in Treatment: 2 Encounter Discharge Information Items Discharge Condition: Stable Ambulatory Status: Walker Discharge Destination: Home Transportation: Private Auto Accompanied By: self Schedule Follow-up Appointment: Yes Clinical Summary of Care: Electronic Signature(s) Signed: 03/04/2020 5:09:36 PM By: Deon Pilling Entered  By: Deon Pilling on 03/04/2020 12:23:06 -------------------------------------------------------------------------------- Lower Extremity Assessment Details Patient Name: Date of Service: [name] [name], [name] [name] 03/04/2020 10:15 [name] M Medical Record Number: [id number] Patient Account Number: [id number] Date of Birth/Sex: Treating RN: 1945/08/31 (74 y.o. [name] Primary Care Daemion Mcniel: [name] Other Clinician: Referring Taydem Cavagnaro: Treating Jaysen Wey/Extender: [name] in Treatment: 2 Edema Assessment Assessed: [Left: No] [Right: No] Edema: [Left: Yes] [Right: Yes] Calf Left: Right: Point of Measurement: From Medial Instep 48 cm 47 cm Ankle Left: Right: Point of Measurement: From Medial Instep 28 cm 27 cm Electronic Signature(s) Signed: 03/05/2020 5:56:00 PM By: Carlene Coria RN Entered  ByCarlene Coria on 03/04/2020 10:57:08 -------------------------------------------------------------------------------- Multi Wound Chart Details Patient Name: Date of Service: [name] [name], [name] [name] 03/04/2020 10:15 [name] M Medical Record Number: [id number] Patient Account Number: [id number] Date of Birth/Sex: Treating RN: 12/20/1945 (74 y.o. [name] Primary Care Marvelous Woolford: [name] Other Clinician: Referring Latrecia Capito: Treating Daveda Larock/Extender: [name] in Treatment: 2 Vital Signs Height(in): 57 Pulse(bpm): 59 Weight(lbs): 260 Blood Pressure(mmHg): 130/81 Body Mass Index(BMI): 36 Temperature(F): 98.6 Respiratory Rate(breaths/min): 20 Photos: [42:No Photos Left, Anterior Lower Leg] [43:No Photos Left, Lateral Lower Leg] [44:No Photos Right, Lateral Lower Leg] Wound Location: [42:Gradually Appeared] [43:Gradually Appeared] [44:Gradually Appeared] Wounding Event: [42:Lymphedema] [43:Lymphedema] [44:Lymphedema] Primary Etiology: [42:Diabetic Wound/Ulcer of the Lower] [43:Diabetic Wound/Ulcer of the  Lower] [44:Diabetic Wound/Ulcer of the Lower] Secondary Etiology: [42:Extremity Cataracts, Lymphedema, Arrhythmia, Cataracts, Lymphedema, Arrhythmia,] [43:Extremity] [44:Extremity Cataracts, Lymphedema, Arrhythmia,] Comorbid History: [42:Congestive Heart Failure, Hypertension, Peripheral Venous Disease, Type II Diabetes, Osteoarthritis, Neuropathy 12/06/2019] [43:Congestive Heart Failure, Hypertension, Peripheral Venous Disease, Type II Diabetes, Osteoarthritis,  Neuropathy 12/06/2019] [44:Congestive Heart Failure, Hypertension, Peripheral Venous Disease, Type II Diabetes, Osteoarthritis, Neuropathy 12/06/2019] Date Acquired: [42:2] [43:2] [44:2] Weeks of Treatment: [42:Open] [43:Open] [44:Open] Wound Status: [42:No] [43:No] [44:Yes] Clustered Wound: [42:N/[name]] [43:N/[name]] [44:3] Clustered Quantity: [42:0.5x0.5x0.1] [43:0x0x0] [44:0x0x0] Measurements L x W x D (cm) [42:0.196] [43:0] [44:0] [name] (cm) : rea [42:0.02] [43:0] [44:0] Volume (cm) : [42:97.40%] [43:100.00%] [44:100.00%] % Reduction in Area: [42:97.30%] [43:100.00%] [44:100.00%] % Reduction in Volume: [42:Full Thickness Without Exposed] [43:Full Thickness Without Exposed] [44:Full Thickness Without Exposed] Classification: [42:Support Structures Medium] [43:Support Structures None Present] [44:Support Structures None Present] Exudate Amount: [42:Serosanguineous] [43:N/[name]] [44:N/[name]] Exudate Type: [42:red, brown] [43:N/[name]] [44:N/[name]] Exudate Color: [42:Flat and Intact] [43:Flat and Intact] [44:Flat and Intact] Wound Margin: [42:Large (67-100%)] [43:None Present (0%)] [44:None Present (0%)] Granulation Amount: [42:Red, Pink] [43:N/[name]] [44:N/[name]] Granulation Quality: [42:None Present (0%)] [43:None Present (0%)] [44:None Present (0%)] Necrotic Amount: [42:Fat Layer (Subcutaneous Tissue): Yes Fascia: No] [44:Fascia: No] Exposed Structures: [42:Fascia: No Tendon: No Muscle: No Joint: No Bone: No Small (1-33%)] [43:Fat Layer (Subcutaneous Tissue): No Tendon:  No Muscle: No Joint: No Bone: No Large (67-100%)] [44:Fat Layer (Subcutaneous Tissue): No Tendon: No Muscle: No Joint: No Bone: No  Large (67-100%)] Epithelialization: [42:Compression Therapy] [43:N/[name]] [44:N/[name]] Wound Number: [id number] 41 N/[name] Photos: No Photos No Photos N/[name] Left, Distal, Lateral Lower Leg Right, Distal, Lateral Lower Leg N/[name] Wound Location: Gradually Appeared Gradually Appeared N/[name] Wounding Event: Lymphedema Lymphedema N/[name] Primary Etiology: Diabetic Wound/Ulcer of the Lower Diabetic Wound/Ulcer of the Lower N/[name] Secondary Etiology: Extremity Extremity Cataracts, Lymphedema, Arrhythmia, Cataracts, Lymphedema, Arrhythmia, N/[name] Comorbid History: Congestive Heart Failure, Congestive Heart Failure, Hypertension, Peripheral Venous Hypertension, Peripheral Venous Disease, Type II Diabetes, Disease, Type II Diabetes, Osteoarthritis, Neuropathy Osteoarthritis, Neuropathy 12/06/2019 12/06/2019 N/[name] Date Acquired: 2 2 N/[name] Weeks of Treatment: Open Open N/[name] Wound Status: No No N/[name] Clustered Wound: N/[name] N/[name] N/[name] Clustered Quantity: 0x0x0 0x0x0 N/[name] Measurements L x W x D (cm) 0 0 N/[name] [name] (cm) : rea 0 0 N/[name] Volume (cm) : 100.00% 100.00% N/[name] % Reduction in Area: 100.00% 100.00% N/[name] % Reduction in Volume: Full Thickness Without Exposed Full Thickness Without Exposed N/[name] Classification: Support Structures Support Structures None Present None Present N/[name] Exudate Amount: N/[name] N/[name] N/[name] Exudate Type: N/[name] N/[name] N/[name] Exudate Color: Flat and Intact Flat and Intact N/[name] Wound Margin: None Present (0%) None Present (0%) N/[name] Granulation Amount: N/[name] N/[name] N/[name] Granulation Quality: None Present (0%) None Present (0%) N/[name] Necrotic Amount: Fascia: No Fascia: No N/[name] Exposed Structures:  Fat Layer (Subcutaneous Tissue): No Fat Layer (Subcutaneous Tissue): No Tendon: No Tendon: No Muscle: No Muscle: No Joint: No Joint: No Bone: No Bone: No Large (67-100%) Large (67-100%)  N/[name] Epithelialization: N/[name] N/[name] N/[name] Procedures Performed: Treatment Notes Wound #42 (Left, Anterior Lower Leg) 1. Cleanse With Wound Cleanser Soap and water 2. Periwound Care Moisturizing lotion TCA Cream 3. Primary Dressing Applied Calcium Alginate Ag 4. Secondary Dressing Dry Gauze 6. Support Layer Applied 4 layer compression wrap Notes netting. Electronic Signature(s) Signed: 03/04/2020 4:47:57 PM By: Linton Ham MD Signed: 03/05/2020 6:12:47 PM By: Levan Hurst RN, BSN Entered By: Linton Ham on 03/04/2020 12:58:10 -------------------------------------------------------------------------------- Multi-Disciplinary Care Plan Details Patient Name: Date of Service: [name] [name], [name] [name] 03/04/2020 10:15 [name] M Medical Record Number: [id number] Patient Account Number: [id number] Date of Birth/Sex: Treating RN: 09-19-1945 (74 y.o. Tonita Phoenix, Lauren Primary Care Khylan Sawyer: [name] Other Clinician: Referring Fidelia Cathers: Treating Razia Screws/Extender: [name] in Treatment: 2 Active Inactive Wound/Skin Impairment Nursing Diagnoses: Knowledge deficit related to ulceration/compromised skin integrity Goals: Ulcer/skin breakdown will have [name] volume reduction of 30% by week 4 Date Initiated: 02/13/2020 Target Resolution Date: 03/14/2020 Goal Status: Active Ulcer/skin breakdown will have [name] volume reduction of 50% by week 8 Date Initiated: 02/13/2020 Target Resolution Date: 03/14/2020 Goal Status: Active Interventions: Assess patient/caregiver ability to obtain necessary supplies Assess patient/caregiver ability to perform ulcer/skin care regimen upon admission and as needed Assess ulceration(s) every visit Notes: Electronic Signature(s) Signed: 03/04/2020 11:34:01 AM By: Rhae Hammock RN Entered By: Rhae Hammock on 03/04/2020 11:33:59 -------------------------------------------------------------------------------- Pain  Assessment Details Patient Name: Date of Service: [name] [name], [name] [name] 03/04/2020 10:15 [name] M Medical Record Number: [id number] Patient Account Number: [id number] Date of Birth/Sex: Treating RN: 04-Aug-1945 (74 y.o. [name] Primary Care Patricia Perales: [name] Other Clinician: Referring Lilliana Turner: Treating Lakeesha Fontanilla/Extender: [name] in Treatment: 2 Active Problems Location of Pain Severity and Description of Pain Patient Has Paino No Site Locations Pain Management and Medication Current Pain Management: Electronic Signature(s) Signed: 03/05/2020 5:56:00 PM By: Carlene Coria RN Entered By: Carlene Coria on 03/04/2020 10:42:43 -------------------------------------------------------------------------------- Patient/Caregiver Education Details Patient Name: Date of Service: [name] [name] 11/29/2021andnbsp10:15 [name] M Medical Record Number: [id number] Patient Account Number: [id number] Date of Birth/Gender: Treating RN: [birth year] (74 y.o. Tonita Phoenix, Lauren Primary Care Physician: [name] Other Clinician: Referring Physician: Treating Physician/Extender: [name] in Treatment: 2 Education Assessment Education Provided To: Patient Education Topics Provided Wound/Skin Impairment: Methods: Explain/Verbal Responses: Reinforcements needed Electronic Signature(s) Signed: 03/04/2020 5:13:46 PM By: Rhae Hammock RN Entered By: Rhae Hammock on 03/04/2020 11:58:29 -------------------------------------------------------------------------------- Wound Assessment Details Patient Name: Date of Service: [name] [name], [name] [name] 03/04/2020 10:15 [name] M Medical Record Number: [id number] Patient Account Number: [id number] Date of Birth/Sex: Treating RN: [birth year] (74 y.o. [name] Primary Care Issiah Huffaker: [name] Other Clinician: Referring Nikyah Lackman: Treating Bemnet Trovato/Extender:  [name] in Treatment: 2 Wound Status Wound Number: 42 Primary Lymphedema Etiology: Wound Location: Left, Anterior Lower Leg Secondary Diabetic Wound/Ulcer of the Lower Extremity Wounding Event: Gradually Appeared Etiology: Date Acquired: 12/06/2019 Wound Open Weeks Of Treatment: 2 Status: Clustered Wound: No Comorbid Cataracts, Lymphedema, Arrhythmia, Congestive Heart Failure, History: Hypertension, Peripheral Venous Disease, Type II Diabetes, Osteoarthritis, Neuropathy Wound Measurements Length: (cm) 0.5 Width: (cm) 0.5 Depth: (cm) 0.1 Area: (cm) 0.196 Volume: (cm) 0.02 % Reduction in Area: 97.4% % Reduction in Volume: 97.3% Epithelialization: Small (1-33%) Tunneling: No Undermining: No Wound Description Classification: Full Thickness  Without Exposed Support Structures Wound Margin: Flat and Intact Exudate Amount: Medium Exudate Type: Serosanguineous Exudate Color: red, brown Foul Odor After Cleansing: No Slough/Fibrino No Wound Bed Granulation Amount: Large (67-100%) Exposed Structure Granulation Quality: Red, Pink Fascia Exposed: No Necrotic Amount: None Present (0%) Fat Layer (Subcutaneous Tissue) Exposed: Yes Tendon Exposed: No Muscle Exposed: No Joint Exposed: No Bone Exposed: No Treatment Notes Wound #42 (Left, Anterior Lower Leg) 1. Cleanse With Wound Cleanser Soap and water 2. Periwound Care Moisturizing lotion TCA Cream 3. Primary Dressing Applied Calcium Alginate Ag 4. Secondary Dressing Dry Gauze 6. Support Layer Applied 4 layer compression wrap Notes netting. Electronic Signature(s) Signed: 03/05/2020 5:56:00 PM By: Carlene Coria RN Entered By: Carlene Coria on 03/04/2020 10:59:07 -------------------------------------------------------------------------------- Wound Assessment Details Patient Name: Date of Service: [name] [name], [name] [name] 03/04/2020 10:15 [name] M Medical Record Number: [id number] Patient  Account Number: [id number] Date of Birth/Sex: Treating RN: 1945-09-11 (74 y.o. [name] Primary Care Tasnia Spegal: [name] Other Clinician: Referring Destry Bezdek: Treating Tea Collums/Extender: [name] in Treatment: 2 Wound Status Wound Number: 43 Primary Lymphedema Etiology: Wound Location: Left, Lateral Lower Leg Secondary Diabetic Wound/Ulcer of the Lower Extremity Wounding Event: Gradually Appeared Etiology: Date Acquired: 12/06/2019 Wound Open Weeks Of Treatment: 2 Status: Clustered Wound: No Comorbid Cataracts, Lymphedema, Arrhythmia, Congestive Heart Failure, History: Hypertension, Peripheral Venous Disease, Type II Diabetes, Osteoarthritis, Neuropathy Wound Measurements Length: (cm) Width: (cm) Depth: (cm) Area: (cm) Volume: (cm) 0 % Reduction in Area: 100% 0 % Reduction in Volume: 100% 0 Epithelialization: Large (67-100%) 0 Tunneling: No 0 Undermining: No Wound Description Classification: Full Thickness Without Exposed Support Structures Wound Margin: Flat and Intact Exudate Amount: None Present Foul Odor After Cleansing: No Slough/Fibrino No Wound Bed Granulation Amount: None Present (0%) Exposed Structure Necrotic Amount: None Present (0%) Fascia Exposed: No Fat Layer (Subcutaneous Tissue) Exposed: No Tendon Exposed: No Muscle Exposed: No Joint Exposed: No Bone Exposed: No Electronic Signature(s) Signed: 03/05/2020 5:56:00 PM By: Carlene Coria RN Entered By: Carlene Coria on 03/04/2020 11:00:53 -------------------------------------------------------------------------------- Wound Assessment Details Patient Name: Date of Service: [place], [place] 03/04/2020 10:15 [name] M Medical Record Number: [id number] Patient Account Number: [id number] Date of Birth/Sex: Treating RN: Aug 08, 1945 (74 y.o. [name] Primary Care Zebediah Beezley: [name] Other Clinician: Referring Shawntez Dickison: Treating  Kalib Bhagat/Extender: [name] in Treatment: 2 Wound Status Wound Number: 44 Primary Lymphedema Etiology: Wound Location: Right, Lateral Lower Leg Secondary Diabetic Wound/Ulcer of the Lower Extremity Wounding Event: Gradually Appeared Etiology: Date Acquired: 12/06/2019 Wound Open Weeks Of Treatment: 2 Status: Clustered Wound: Yes Comorbid Cataracts, Lymphedema, Arrhythmia, Congestive Heart Failure, History: Hypertension, Peripheral Venous Disease, Type II Diabetes, Osteoarthritis, Neuropathy Wound Measurements Length: (cm) Width: (cm) Depth: (cm) Clustered Quantity: Area: (cm) Volume: (cm) 0 % Reduction in Area: 100% 0 % Reduction in Volume: 100% 0 Epithelialization: Large (67-100%) 3 Tunneling: No 0 Undermining: No 0 Wound Description Classification: Full Thickness Without Exposed Support Structures Wound Margin: Flat and Intact Exudate Amount: None Present Foul Odor After Cleansing: No Slough/Fibrino No Wound Bed Granulation Amount: None Present (0%) Exposed Structure Necrotic Amount: None Present (0%) Fascia Exposed: No Fat Layer (Subcutaneous Tissue) Exposed: No Tendon Exposed: No Muscle Exposed: No Joint Exposed: No Bone Exposed: No Electronic Signature(s) Signed: 03/05/2020 5:56:00 PM By: Carlene Coria RN Entered By: Carlene Coria on 03/04/2020 10:59:47 -------------------------------------------------------------------------------- Wound Assessment Details Patient Name: Date of Service: GERALDENE, EISEL 03/04/2020 10:15 [name] M Medical Record Number: [id number] Patient Account Number: [id number]  Date of Birth/Sex: Treating RN: 1945/12/25 (74 y.o. 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 Event: Gradually Appeared Etiology: Date Acquired: 12/06/2019 Wound Open Weeks Of Treatment: 2 Status: Clustered Wound: No Comorbid Cataracts, Lymphedema, Arrhythmia, Congestive Heart Failure, History: Hypertension, Peripheral Venous Disease, Type II Diabetes, Osteoarthritis, Neuropathy Wound Measurements Length: (cm) Width: (cm) Depth: (cm) Area: (cm) Volume: (cm) 0 % Reduction in Area: 100% 0 % Reduction in Volume: 100% 0 Epithelialization: Large (67-100%) 0 Tunneling: No 0 Undermining: No Wound Description Classification: Full Thickness Without Exposed Support Structures Wound Margin: Flat and Intact Exudate Amount: None Present Foul Odor After Cleansing: No Slough/Fibrino No Wound Bed Granulation Amount: None Present (0%) Exposed Structure Necrotic Amount: None Present (0%) Fascia Exposed: No Fat Layer (Subcutaneous Tissue) Exposed: No Tendon Exposed: No Muscle Exposed: No Joint Exposed: No Bone Exposed: No Electronic Signature(s) Signed: 03/05/2020 5:56:00 PM By: Carlene Coria RN Entered By: Carlene Coria on 03/04/2020 11:00:04 -------------------------------------------------------------------------------- Wound Assessment Details Patient Name: Date of Service: TEIANA, [place]JD[place] 03/04/2020 10:15 [name] M Medical Record Number: [id number] Patient Account Number: [id number] Date of Birth/Sex: Treating RN: [birth year] (74 y.o. 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 Event: Gradually Appeared Etiology: Date Acquired: 12/06/2019 Wound Open Weeks Of Treatment:  2 Status: Status: Clustered Wound: No Comorbid Cataracts, Lymphedema, Arrhythmia, Congestive Heart Failure, History: Hypertension, Peripheral Venous Disease, Type II Diabetes, Osteoarthritis, Neuropathy Wound Measurements Length: (cm) Width: (cm) Depth: (cm) Area: (cm) Volume: (cm) 0 % Reduction in Area: 100% 0 % Reduction in Volume: 100% 0 Epithelialization: Large (67-100%) 0 Tunneling: No 0 Undermining: No Wound Description Classification: Full Thickness Without Exposed Support Structures Wound Margin: Flat and Intact Exudate Amount: None Present Foul Odor After Cleansing: No Slough/Fibrino No Wound Bed Granulation Amount: None Present (0%) Exposed Structure Necrotic Amount: None Present (0%) Fascia Exposed: No Fat Layer (Subcutaneous Tissue) Exposed: No Tendon Exposed: No Muscle Exposed: No Joint Exposed: No Bone Exposed: No Electronic Signature(s) Signed: 03/05/2020 5:56:00 PM By: Carlene Coria RN Entered By: Carlene Coria on 03/04/2020 11:00:22 -------------------------------------------------------------------------------- [place] Details Patient Name: Date of Service: Winer, [name] [name] 03/04/2020 10:15 [name] M Medical Record Number: [id number] Patient Account Number: [id number] Date of Birth/Sex: Treating RN: August 01, 1945 (74 y.o. [name] Primary Care Mairead Schwarzkopf: [name] Other Clinician: Referring Elva Mauro: Treating Kylynn Street/Extender: [name] in Treatment: 2 Vital Signs Time Taken: 10:40 Temperature (F): 98.6 Height (in): 71 Pulse (bpm): 93 Weight (lbs): 260 Respiratory Rate (breaths/min): 20 Body Mass Index (BMI): 36.3 Blood Pressure (mmHg): 130/81 Reference Range: 80 - 120 mg / dl Electronic Signature(s) Signed: 03/05/2020 5:56:00 PM By: Carlene Coria RN Entered By: Carlene Coria on 03/04/2020 10:42:36

## 2020-03-06 ENCOUNTER — Telehealth: Payer: Self-pay | Admitting: Internal Medicine

## 2020-03-06 NOTE — Telephone Encounter (Signed)
Veronica with [facility] called and is asking for a GI referral for the patient. She said that the medications that the patient is on for her GI issues is not working. Please give Verdene Lennert a call back if the referral is sent and the patient a call as well. Verdene Lennert will be out of office for the next two days and she said that you can ask for [facility].

## 2020-03-07 NOTE — Telephone Encounter (Signed)
We need some kind of interaction with patient to make more changes. I do not see any info here about how the 2 new medications we have called in for her are working. Can be virtual or telephone if unable to do video.

## 2020-03-07 NOTE — Telephone Encounter (Signed)
Left message for patient to call back to schedule a tele or OV. Please schedule pt if she calls back.

## 2020-03-08 ENCOUNTER — Encounter: Payer: Self-pay | Admitting: Internal Medicine

## 2020-03-08 ENCOUNTER — Telehealth (INDEPENDENT_AMBULATORY_CARE_PROVIDER_SITE_OTHER): Payer: PPO | Admitting: Internal Medicine

## 2020-03-08 DIAGNOSIS — R112 Nausea with vomiting, unspecified: Secondary | ICD-10-CM

## 2020-03-08 MED ORDER — ONDANSETRON 4 MG PO TBDP: 4.0000 mg | ORAL_TABLET | Freq: Three times a day (TID) | ORAL | 0 refills | Status: DC | PRN

## 2020-03-08 NOTE — Assessment & Plan Note (Signed)
GI referral placed as she does not have UTI or other source of infection to explain and has been treated for UTI. She is asked to pick up zofran odt and use this to help nausea and then while not nauseous take reglan (as this has helped some in past flares to abort). She does not have transportation and is tired of taking medications for this so is not sure she will try this. Recent CT abdomen and pelvis without etiology and prior workup in the past 3-4 years for same without clear etiology. She is advised to go to urgent care for fluids as she sounds weak on the phone and is lightheaded with any movement but she does not feel she can do that now and does not want transport via EMS.

## 2020-03-08 NOTE — Progress Notes (Signed)
Virtual Visit via Audio Note  I connected with [name] [name] on 03/08/20 at 10:40 AM EST by an audio enabled telemedicine application and verified that I am speaking with the correct person using two identifiers.  The patient and the provider were at separate locations throughout the entire encounter. Patient location: home, Provider location: work   I discussed the limitations of evaluation and management by telemedicine and the availability of in person appointments. The patient expressed understanding and agreed to proceed. The patient and the provider were the only parties present for the visit unless noted in HPI below.  History of Present Illness: The patient is a 74 y.o. female with visit for ongoing N/V. Started about 4-6 weeks ago. She has had intractable nausea and vomiting since that time. She is gagging even without eating. She is throwing back up all liquids and solids including water. Has not eaten any food in weeks. Trying to drink some liquids but feels as though they are all coming back up. She is extremely weak and tired and feels she is losing weight due to not being able to eat. She has tried different nausea medications including phenergan (she vomited up oral, PR made her drowsy so she did not take again) and zofran (unsure if she has the odt or oral, did swallow this and it helps with nausea but only for a few short minutes so she stopped taking), and reglan (she did try to take this but vomited it up and did not try it again. Has been treated for UTI which did not seem to help symptoms. She has had several episodes of intractable vomiting in the past within the past few years. Has had workup of this without clear cause. At least once it was thought to be UTI. She had visit with [place] about 3 weeks ago without cause. Some AKI which is persistent but stable throughout. Then visit at ER which did not reveal cause and specifically had CT abdomen/pelvis without changes, blood work and U/A  and culture without significant growth. Denies fevers or chills. Extreme weakness/fatigue. Feels lightheaded with standing or moving. Overall it is severe and not improving.  Observations/Objective: Voice weak, A and O times 3, no cough or SOB during visit  Assessment and Plan: See problem oriented charting  Follow Up Instructions: Rx for zofran ODT to try, then take reglan after nausea better with zofran to see if she can keep this down as it has helped in the past with at least 1 episode, referral to GI for further assessment, she is advised to go to urgent care for fluids given her severe symptoms and weakness on phone but she declines stating she cannot get out of house currently and does not want to go via EMS. She will call some friends to see if she can arrange transportation as she is too weak to ride the bus  I discussed the assessment and treatment plan with the patient. The patient was provided an opportunity to ask questions and all were answered. The patient agreed with the plan and demonstrated an understanding of the instructions.  Visit time 25 minutes in non-face to face communication with patient and coordination of care    The patient was advised to call back or seek an in-person evaluation if the symptoms worsen or if the condition fails to improve as anticipated.  Hoyt Koch, MD

## 2020-03-11 ENCOUNTER — Telehealth: Payer: Self-pay | Admitting: Internal Medicine

## 2020-03-11 ENCOUNTER — Encounter (HOSPITAL_BASED_OUTPATIENT_CLINIC_OR_DEPARTMENT_OTHER): Payer: PPO | Attending: Internal Medicine | Admitting: Internal Medicine

## 2020-03-11 DIAGNOSIS — R112 Nausea with vomiting, unspecified: Secondary | ICD-10-CM | POA: Diagnosis not present

## 2020-03-11 NOTE — Telephone Encounter (Signed)
Patient called and was wondering if Dr.Crawford could put in home health orders for fluid. Patient said she is very weak.

## 2020-03-12 NOTE — Telephone Encounter (Signed)
As I told her on the phone I am not aware that home health can do fluids she would need to go to an urgent care for fluids. Has she tried the plan I recommended during the visit? If not please read her plan from visit so she can try in the meantime.

## 2020-03-14 ENCOUNTER — Telehealth: Payer: PPO | Admitting: Internal Medicine

## 2020-03-14 NOTE — Progress Notes (Signed)
Virtual Visit via audio Note  I connected with [name] [name] on 03/14/20 at  9:20 AM EST by an audio-only enabled telemedicine application however she did not answer and did not return our voicemail.

## 2020-03-14 NOTE — Telephone Encounter (Signed)
Left detailed message informing pt of below.  

## 2020-03-16 ENCOUNTER — Emergency Department (HOSPITAL_COMMUNITY): Payer: PPO

## 2020-03-16 ENCOUNTER — Inpatient Hospital Stay (HOSPITAL_COMMUNITY)
Admission: EM | Admit: 2020-03-16 | Discharge: 2020-04-08 | DRG: 640 | Disposition: A | Discharge status: Skilled Nursing Facility | Payer: PPO | Attending: Family Medicine | Admitting: Family Medicine

## 2020-03-16 DIAGNOSIS — R680 Hypothermia, not associated with low environmental temperature: Secondary | ICD-10-CM | POA: Diagnosis present

## 2020-03-16 DIAGNOSIS — N3001 Acute cystitis with hematuria: Secondary | ICD-10-CM | POA: Diagnosis present

## 2020-03-16 DIAGNOSIS — I714 Abdominal aortic aneurysm, without rupture: Secondary | ICD-10-CM | POA: Diagnosis present

## 2020-03-16 DIAGNOSIS — L89159 Pressure ulcer of sacral region, unspecified stage: Secondary | ICD-10-CM | POA: Diagnosis present

## 2020-03-16 DIAGNOSIS — E43 Unspecified severe protein-calorie malnutrition: Secondary | ICD-10-CM | POA: Insufficient documentation

## 2020-03-16 DIAGNOSIS — R0902 Hypoxemia: Secondary | ICD-10-CM | POA: Diagnosis not present

## 2020-03-16 DIAGNOSIS — K222 Esophageal obstruction: Secondary | ICD-10-CM | POA: Diagnosis present

## 2020-03-16 DIAGNOSIS — M1712 Unilateral primary osteoarthritis, left knee: Secondary | ICD-10-CM | POA: Diagnosis not present

## 2020-03-16 DIAGNOSIS — R6520 Severe sepsis without septic shock: Secondary | ICD-10-CM | POA: Diagnosis not present

## 2020-03-16 DIAGNOSIS — K3189 Other diseases of stomach and duodenum: Secondary | ICD-10-CM | POA: Diagnosis not present

## 2020-03-16 DIAGNOSIS — I872 Venous insufficiency (chronic) (peripheral): Secondary | ICD-10-CM | POA: Diagnosis present

## 2020-03-16 DIAGNOSIS — Z87891 Personal history of nicotine dependence: Secondary | ICD-10-CM

## 2020-03-16 DIAGNOSIS — E119 Type 2 diabetes mellitus without complications: Secondary | ICD-10-CM | POA: Diagnosis not present

## 2020-03-16 DIAGNOSIS — E440 Moderate protein-calorie malnutrition: Secondary | ICD-10-CM | POA: Diagnosis not present

## 2020-03-16 DIAGNOSIS — Z820 Family history of epilepsy and other diseases of the nervous system: Secondary | ICD-10-CM

## 2020-03-16 DIAGNOSIS — Z880 Allergy status to penicillin: Secondary | ICD-10-CM

## 2020-03-16 DIAGNOSIS — G319 Degenerative disease of nervous system, unspecified: Secondary | ICD-10-CM | POA: Diagnosis not present

## 2020-03-16 DIAGNOSIS — I10 Essential (primary) hypertension: Secondary | ICD-10-CM | POA: Diagnosis not present

## 2020-03-16 DIAGNOSIS — R112 Nausea with vomiting, unspecified: Secondary | ICD-10-CM | POA: Diagnosis present

## 2020-03-16 DIAGNOSIS — K269 Duodenal ulcer, unspecified as acute or chronic, without hemorrhage or perforation: Secondary | ICD-10-CM | POA: Diagnosis not present

## 2020-03-16 DIAGNOSIS — E871 Hypo-osmolality and hyponatremia: Secondary | ICD-10-CM | POA: Diagnosis present

## 2020-03-16 DIAGNOSIS — Z7984 Long term (current) use of oral hypoglycemic drugs: Secondary | ICD-10-CM

## 2020-03-16 DIAGNOSIS — R2681 Unsteadiness on feet: Secondary | ICD-10-CM | POA: Diagnosis not present

## 2020-03-16 DIAGNOSIS — M11261 Other chondrocalcinosis, right knee: Secondary | ICD-10-CM | POA: Diagnosis not present

## 2020-03-16 DIAGNOSIS — Z801 Family history of malignant neoplasm of trachea, bronchus and lung: Secondary | ICD-10-CM

## 2020-03-16 DIAGNOSIS — Z86711 Personal history of pulmonary embolism: Secondary | ICD-10-CM | POA: Diagnosis not present

## 2020-03-16 DIAGNOSIS — R590 Localized enlarged lymph nodes: Secondary | ICD-10-CM | POA: Diagnosis not present

## 2020-03-16 DIAGNOSIS — K209 Esophagitis, unspecified without bleeding: Secondary | ICD-10-CM | POA: Diagnosis not present

## 2020-03-16 DIAGNOSIS — A419 Sepsis, unspecified organism: Secondary | ICD-10-CM

## 2020-03-16 DIAGNOSIS — R404 Transient alteration of awareness: Secondary | ICD-10-CM | POA: Diagnosis not present

## 2020-03-16 DIAGNOSIS — R748 Abnormal levels of other serum enzymes: Secondary | ICD-10-CM | POA: Diagnosis present

## 2020-03-16 DIAGNOSIS — M25559 Pain in unspecified hip: Secondary | ICD-10-CM

## 2020-03-16 DIAGNOSIS — N189 Chronic kidney disease, unspecified: Secondary | ICD-10-CM | POA: Diagnosis not present

## 2020-03-16 DIAGNOSIS — N179 Acute kidney failure, unspecified: Secondary | ICD-10-CM

## 2020-03-16 DIAGNOSIS — E1122 Type 2 diabetes mellitus with diabetic chronic kidney disease: Secondary | ICD-10-CM | POA: Diagnosis present

## 2020-03-16 DIAGNOSIS — R52 Pain, unspecified: Secondary | ICD-10-CM | POA: Diagnosis not present

## 2020-03-16 DIAGNOSIS — I878 Other specified disorders of veins: Secondary | ICD-10-CM | POA: Diagnosis present

## 2020-03-16 DIAGNOSIS — Z8669 Personal history of other diseases of the nervous system and sense organs: Secondary | ICD-10-CM | POA: Diagnosis not present

## 2020-03-16 DIAGNOSIS — F05 Delirium due to known physiological condition: Secondary | ICD-10-CM | POA: Diagnosis present

## 2020-03-16 DIAGNOSIS — Z825 Family history of asthma and other chronic lower respiratory diseases: Secondary | ICD-10-CM | POA: Diagnosis not present

## 2020-03-16 DIAGNOSIS — N3000 Acute cystitis without hematuria: Secondary | ICD-10-CM | POA: Diagnosis not present

## 2020-03-16 DIAGNOSIS — K219 Gastro-esophageal reflux disease without esophagitis: Secondary | ICD-10-CM | POA: Diagnosis not present

## 2020-03-16 DIAGNOSIS — R1310 Dysphagia, unspecified: Secondary | ICD-10-CM | POA: Diagnosis not present

## 2020-03-16 DIAGNOSIS — G9341 Metabolic encephalopathy: Secondary | ICD-10-CM | POA: Diagnosis present

## 2020-03-16 DIAGNOSIS — J341 Cyst and mucocele of nose and nasal sinus: Secondary | ICD-10-CM | POA: Diagnosis not present

## 2020-03-16 DIAGNOSIS — R410 Disorientation, unspecified: Secondary | ICD-10-CM | POA: Diagnosis not present

## 2020-03-16 DIAGNOSIS — K319 Disease of stomach and duodenum, unspecified: Secondary | ICD-10-CM | POA: Diagnosis present

## 2020-03-16 DIAGNOSIS — I350 Nonrheumatic aortic (valve) stenosis: Secondary | ICD-10-CM | POA: Diagnosis not present

## 2020-03-16 DIAGNOSIS — L97911 Non-pressure chronic ulcer of unspecified part of right lower leg limited to breakdown of skin: Secondary | ICD-10-CM | POA: Diagnosis not present

## 2020-03-16 DIAGNOSIS — R4701 Aphasia: Secondary | ICD-10-CM | POA: Diagnosis present

## 2020-03-16 DIAGNOSIS — T68XXXA Hypothermia, initial encounter: Secondary | ICD-10-CM | POA: Diagnosis not present

## 2020-03-16 DIAGNOSIS — I509 Heart failure, unspecified: Secondary | ICD-10-CM | POA: Diagnosis present

## 2020-03-16 DIAGNOSIS — M50121 Cervical disc disorder at C4-C5 level with radiculopathy: Secondary | ICD-10-CM | POA: Diagnosis not present

## 2020-03-16 DIAGNOSIS — M2550 Pain in unspecified joint: Secondary | ICD-10-CM

## 2020-03-16 DIAGNOSIS — R2689 Other abnormalities of gait and mobility: Secondary | ICD-10-CM | POA: Diagnosis not present

## 2020-03-16 DIAGNOSIS — M50122 Cervical disc disorder at C5-C6 level with radiculopathy: Secondary | ICD-10-CM | POA: Diagnosis not present

## 2020-03-16 DIAGNOSIS — F802 Mixed receptive-expressive language disorder: Secondary | ICD-10-CM

## 2020-03-16 DIAGNOSIS — K2100 Gastro-esophageal reflux disease with esophagitis, without bleeding: Secondary | ICD-10-CM | POA: Diagnosis not present

## 2020-03-16 DIAGNOSIS — I890 Lymphedema, not elsewhere classified: Secondary | ICD-10-CM | POA: Diagnosis present

## 2020-03-16 DIAGNOSIS — B952 Enterococcus as the cause of diseases classified elsewhere: Secondary | ICD-10-CM | POA: Diagnosis present

## 2020-03-16 DIAGNOSIS — M1990 Unspecified osteoarthritis, unspecified site: Secondary | ICD-10-CM | POA: Diagnosis not present

## 2020-03-16 DIAGNOSIS — Z79899 Other long term (current) drug therapy: Secondary | ICD-10-CM

## 2020-03-16 DIAGNOSIS — N1831 Chronic kidney disease, stage 3a: Secondary | ICD-10-CM | POA: Diagnosis present

## 2020-03-16 DIAGNOSIS — I69321 Dysphasia following cerebral infarction: Secondary | ICD-10-CM | POA: Diagnosis not present

## 2020-03-16 DIAGNOSIS — N390 Urinary tract infection, site not specified: Secondary | ICD-10-CM

## 2020-03-16 DIAGNOSIS — E512 Wernicke's encephalopathy: Principal | ICD-10-CM

## 2020-03-16 DIAGNOSIS — M1612 Unilateral primary osteoarthritis, left hip: Secondary | ICD-10-CM | POA: Diagnosis present

## 2020-03-16 DIAGNOSIS — M5011 Cervical disc disorder with radiculopathy,  high cervical region: Secondary | ICD-10-CM | POA: Diagnosis not present

## 2020-03-16 DIAGNOSIS — K224 Dyskinesia of esophagus: Secondary | ICD-10-CM | POA: Diagnosis present

## 2020-03-16 DIAGNOSIS — J9811 Atelectasis: Secondary | ICD-10-CM | POA: Diagnosis not present

## 2020-03-16 DIAGNOSIS — E860 Dehydration: Secondary | ICD-10-CM | POA: Diagnosis present

## 2020-03-16 DIAGNOSIS — R7989 Other specified abnormal findings of blood chemistry: Secondary | ICD-10-CM | POA: Diagnosis present

## 2020-03-16 DIAGNOSIS — D75839 Thrombocytosis, unspecified: Secondary | ICD-10-CM | POA: Diagnosis not present

## 2020-03-16 DIAGNOSIS — R296 Repeated falls: Secondary | ICD-10-CM | POA: Diagnosis not present

## 2020-03-16 DIAGNOSIS — I130 Hypertensive heart and chronic kidney disease with heart failure and stage 1 through stage 4 chronic kidney disease, or unspecified chronic kidney disease: Secondary | ICD-10-CM | POA: Diagnosis present

## 2020-03-16 DIAGNOSIS — Z833 Family history of diabetes mellitus: Secondary | ICD-10-CM

## 2020-03-16 DIAGNOSIS — N898 Other specified noninflammatory disorders of vagina: Secondary | ICD-10-CM | POA: Diagnosis not present

## 2020-03-16 DIAGNOSIS — M2540 Effusion, unspecified joint: Secondary | ICD-10-CM

## 2020-03-16 DIAGNOSIS — R4182 Altered mental status, unspecified: Secondary | ICD-10-CM

## 2020-03-16 DIAGNOSIS — I6782 Cerebral ischemia: Secondary | ICD-10-CM | POA: Diagnosis not present

## 2020-03-16 DIAGNOSIS — M4802 Spinal stenosis, cervical region: Secondary | ICD-10-CM

## 2020-03-16 DIAGNOSIS — H5500 Unspecified nystagmus: Secondary | ICD-10-CM | POA: Diagnosis present

## 2020-03-16 DIAGNOSIS — M4312 Spondylolisthesis, cervical region: Secondary | ICD-10-CM | POA: Diagnosis not present

## 2020-03-16 DIAGNOSIS — R079 Chest pain, unspecified: Secondary | ICD-10-CM | POA: Diagnosis not present

## 2020-03-16 DIAGNOSIS — E870 Hyperosmolality and hypernatremia: Secondary | ICD-10-CM | POA: Diagnosis present

## 2020-03-16 DIAGNOSIS — Z7982 Long term (current) use of aspirin: Secondary | ICD-10-CM

## 2020-03-16 DIAGNOSIS — D509 Iron deficiency anemia, unspecified: Secondary | ICD-10-CM | POA: Diagnosis present

## 2020-03-16 DIAGNOSIS — E669 Obesity, unspecified: Secondary | ICD-10-CM | POA: Diagnosis not present

## 2020-03-16 DIAGNOSIS — M11262 Other chondrocalcinosis, left knee: Secondary | ICD-10-CM | POA: Diagnosis not present

## 2020-03-16 DIAGNOSIS — M25552 Pain in left hip: Secondary | ICD-10-CM | POA: Diagnosis not present

## 2020-03-16 DIAGNOSIS — M25461 Effusion, right knee: Secondary | ICD-10-CM | POA: Diagnosis not present

## 2020-03-16 DIAGNOSIS — M109 Gout, unspecified: Secondary | ICD-10-CM | POA: Diagnosis present

## 2020-03-16 DIAGNOSIS — R509 Fever, unspecified: Secondary | ICD-10-CM

## 2020-03-16 DIAGNOSIS — M25551 Pain in right hip: Secondary | ICD-10-CM | POA: Diagnosis not present

## 2020-03-16 DIAGNOSIS — Z6835 Body mass index (BMI) 35.0-35.9, adult: Secondary | ICD-10-CM

## 2020-03-16 DIAGNOSIS — Z885 Allergy status to narcotic agent status: Secondary | ICD-10-CM

## 2020-03-16 DIAGNOSIS — M2578 Osteophyte, vertebrae: Secondary | ICD-10-CM | POA: Diagnosis not present

## 2020-03-16 DIAGNOSIS — L304 Erythema intertrigo: Secondary | ICD-10-CM | POA: Diagnosis not present

## 2020-03-16 DIAGNOSIS — Z8679 Personal history of other diseases of the circulatory system: Secondary | ICD-10-CM | POA: Diagnosis not present

## 2020-03-16 DIAGNOSIS — E876 Hypokalemia: Secondary | ICD-10-CM | POA: Diagnosis present

## 2020-03-16 DIAGNOSIS — K611 Rectal abscess: Secondary | ICD-10-CM | POA: Diagnosis not present

## 2020-03-16 DIAGNOSIS — I959 Hypotension, unspecified: Secondary | ICD-10-CM | POA: Diagnosis not present

## 2020-03-16 DIAGNOSIS — M1711 Unilateral primary osteoarthritis, right knee: Secondary | ICD-10-CM | POA: Diagnosis not present

## 2020-03-16 DIAGNOSIS — M170 Bilateral primary osteoarthritis of knee: Secondary | ICD-10-CM | POA: Diagnosis not present

## 2020-03-16 DIAGNOSIS — I480 Paroxysmal atrial fibrillation: Secondary | ICD-10-CM | POA: Diagnosis not present

## 2020-03-16 DIAGNOSIS — Z0000 Encounter for general adult medical examination without abnormal findings: Secondary | ICD-10-CM

## 2020-03-16 DIAGNOSIS — D649 Anemia, unspecified: Secondary | ICD-10-CM | POA: Diagnosis not present

## 2020-03-16 DIAGNOSIS — D696 Thrombocytopenia, unspecified: Secondary | ICD-10-CM | POA: Diagnosis present

## 2020-03-16 DIAGNOSIS — M4722 Other spondylosis with radiculopathy, cervical region: Secondary | ICD-10-CM | POA: Diagnosis not present

## 2020-03-16 DIAGNOSIS — M1120 Other chondrocalcinosis, unspecified site: Secondary | ICD-10-CM

## 2020-03-16 DIAGNOSIS — Z20822 Contact with and (suspected) exposure to covid-19: Secondary | ICD-10-CM | POA: Diagnosis present

## 2020-03-16 DIAGNOSIS — M6389 Disorders of muscle in diseases classified elsewhere, multiple sites: Secondary | ICD-10-CM | POA: Diagnosis not present

## 2020-03-16 DIAGNOSIS — J320 Chronic maxillary sinusitis: Secondary | ICD-10-CM | POA: Diagnosis not present

## 2020-03-16 DIAGNOSIS — K5900 Constipation, unspecified: Secondary | ICD-10-CM | POA: Diagnosis not present

## 2020-03-16 DIAGNOSIS — R278 Other lack of coordination: Secondary | ICD-10-CM | POA: Diagnosis not present

## 2020-03-16 DIAGNOSIS — M6281 Muscle weakness (generalized): Secondary | ICD-10-CM | POA: Diagnosis not present

## 2020-03-16 DIAGNOSIS — R488 Other symbolic dysfunctions: Secondary | ICD-10-CM | POA: Diagnosis not present

## 2020-03-16 DIAGNOSIS — E538 Deficiency of other specified B group vitamins: Secondary | ICD-10-CM | POA: Diagnosis present

## 2020-03-16 LAB — I-STAT ARTERIAL BLOOD GAS, ED
Acid-Base Excess: 3 mmol/L — ABNORMAL HIGH (ref 0.0–2.0)
Bicarbonate: 26.6 mmol/L (ref 20.0–28.0)
Calcium, Ion: 1.26 mmol/L (ref 1.15–1.40)
HCT: 40 % (ref 36.0–46.0)
Hemoglobin: 13.6 g/dL (ref 12.0–15.0)
O2 Saturation: 99 %
Potassium: 3.1 mmol/L — ABNORMAL LOW (ref 3.5–5.1)
Sodium: 147 mmol/L — ABNORMAL HIGH (ref 135–145)
TCO2: 28 mmol/L (ref 22–32)
pCO2 arterial: 36.3 mmHg (ref 32.0–48.0)
pH, Arterial: 7.472 — ABNORMAL HIGH (ref 7.350–7.450)
pO2, Arterial: 121 mmHg — ABNORMAL HIGH (ref 83.0–108.0)

## 2020-03-16 IMAGING — DX DG CHEST 1V PORT
1 series · 1 of 1 positions shown · non-contrast
Comparison: Chest x-ray

CLINICAL DATA: Altered mental status, unresponsive

EXAM:
PORTABLE CHEST 1 VIEW

[chest]
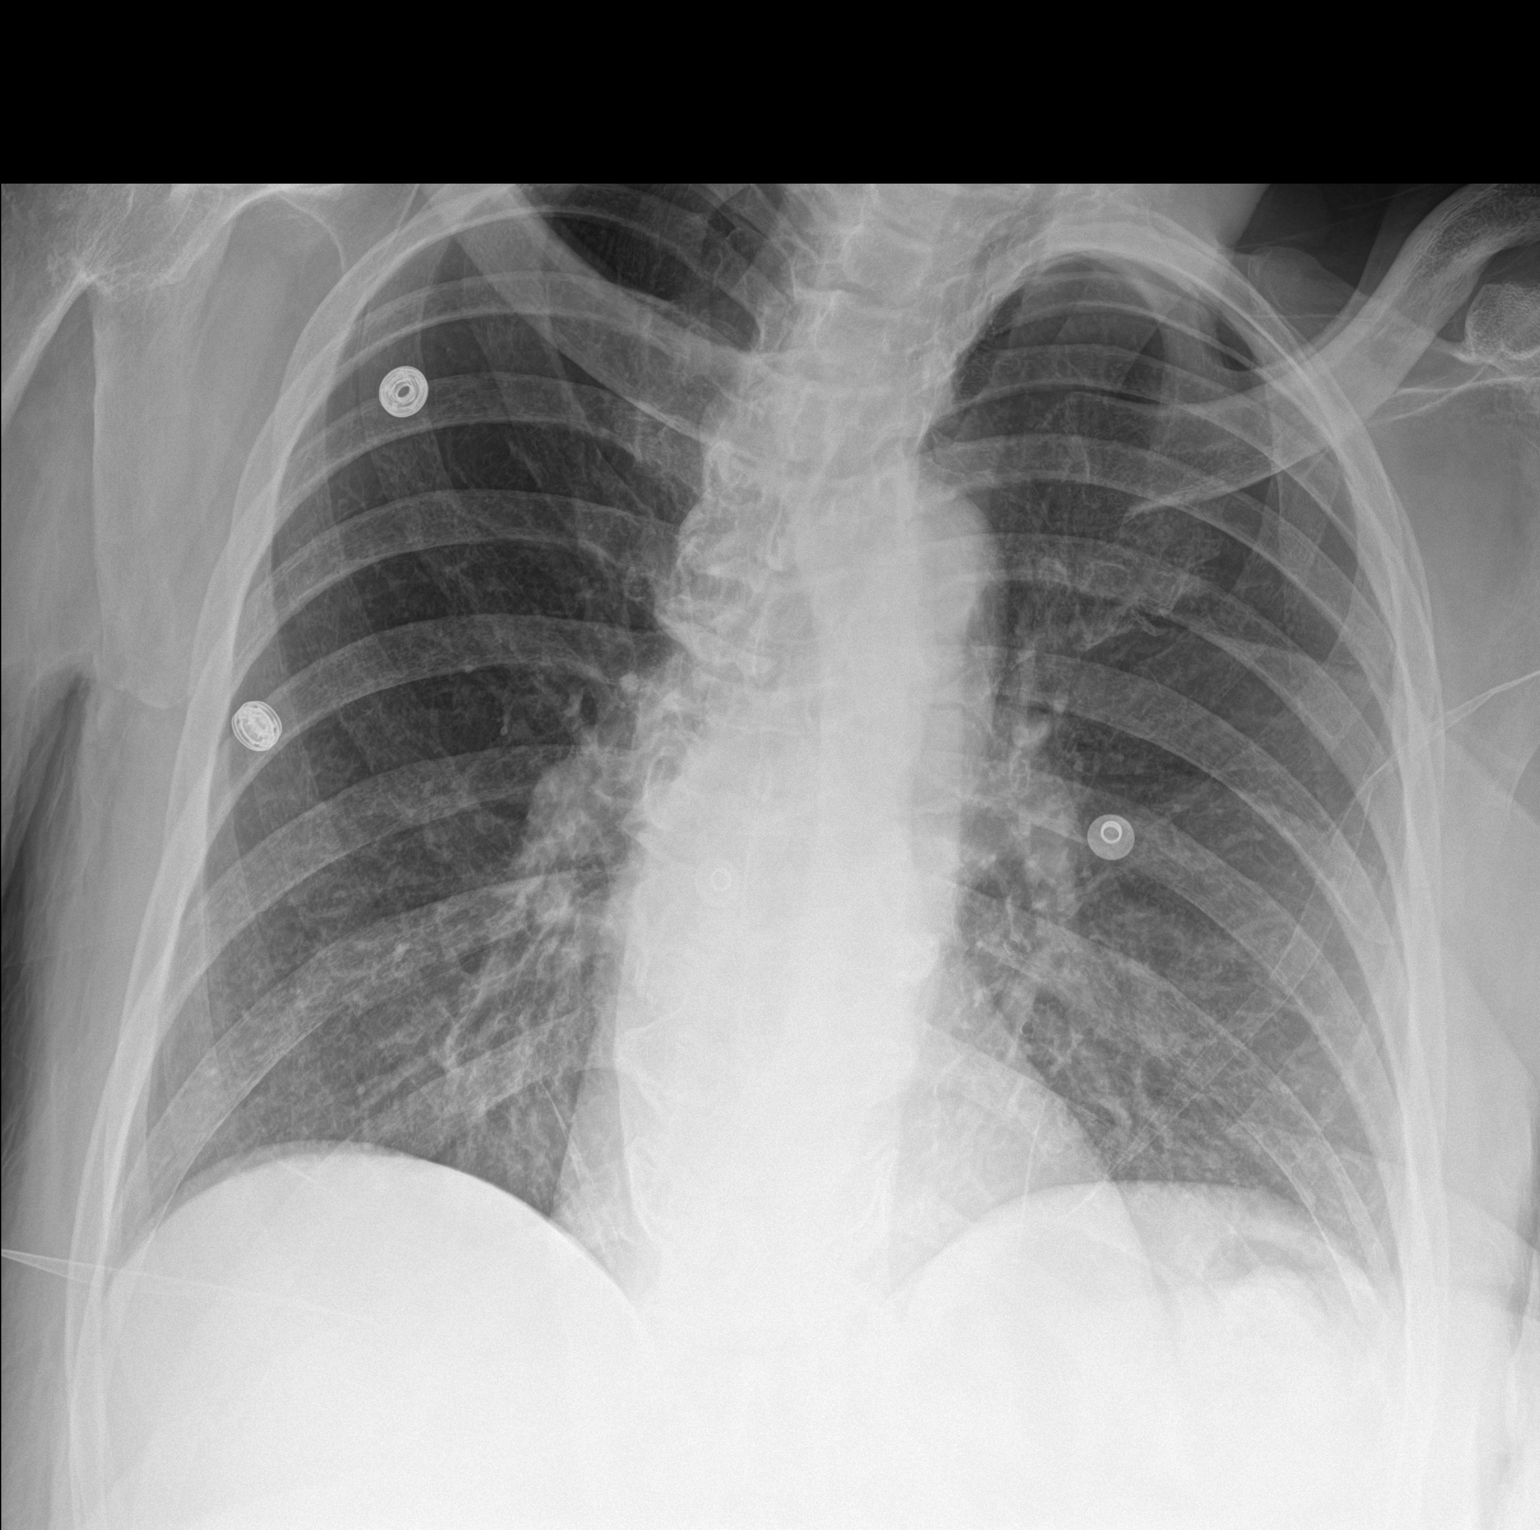

[1 of 1 positions shown; findings below may reference images not displayed]

FINDINGS: The heart size and mediastinal contours are within normal limits.

No focal consolidation. No pulmonary edema. No pleural effusion. No
pneumothorax.

No acute osseous abnormality.
IMPRESSION: No active disease.

## 2020-03-16 NOTE — ED Provider Notes (Signed)
[facility] EMERGENCY DEPARTMENT Provider Note   CSN: [id number] Arrival date & time: 03/16/20  2239     History No chief complaint on file.   LEVEL 5 CAVEAT 2/2 AMS  [name] [name] is a 74 y.o. female.  74 y/o female presents from independent living facility. Found by police after daughter called for well visit check. Daughter told police she had not heard from her mother in 2 days. Police found patient sitting leaning forward in the central living space with bugs crawling on her. Patient was next to her walker sitting in her soiled clothes with excrement on the floor around her. Patient denies pain including chest pain, back pain, abdominal pain. Denies SOB. Cannot tell me the events leading up to her encounter in the ED today. Per chart review, patient seen on 03/08/20 by her PCP for intractable N/V x 4-6 weeks with profound weakness, lightheadedness.  The history is provided by the police.      Past Medical History:  Diagnosis Date  . Acute kidney injury ([place])   . Aortic stenosis, mild 11/17/2013  . Arthritis    "both knees, spine, back, fingers" (11/29/2013)  . CHF (congestive heart failure) ([place])   . Edema   . GERD (gastroesophageal reflux disease)   . Heart murmur   . History of diastolic dysfunction    Echo 09/8030 with diastolic dysfunction  . HTN (hypertension)   . Morbid obesity ([place])   . OA (osteoarthritis)   . PAC (premature atrial contraction)    per prior Holter  . Palpitations   . Pneumonia    "as a child"  . PVC's (premature ventricular contractions)    per prior Holter  . SVT (supraventricular tachycardia) ([place])   . Tachycardia    noted at 07/28/11 visit. Started on beta blocker. Possible atrial flutter vs long PR tachycardia/AVNRT  . Type II diabetes mellitus [facility])     Patient Active Problem List   Diagnosis Date Noted  . Intractable vomiting 02/15/2020  . Klebsiella cystitis 02/15/2020  . Bilateral leg ulcer, limited to  breakdown of skin ([place]) 01/16/2020  . Decreased urination 01/16/2020  . Dehydration 01/16/2020  . Allergic rhinitis 07/28/2019  . Postnasal drip 11/16/2017  . Sensorineural hearing loss (SNHL), bilateral 11/16/2017  . Tinnitus of both ears 11/16/2017  . Obesity, Class II, BMI 35-39.9, isolated (see actual BMI) 11/08/2017  . CKD (chronic kidney disease), stage III ([place]) 11/08/2017  . Anemia 11/07/2017  . Thrombocytosis 11/07/2017  . Lymphedema 11/07/2017  . AKI (acute kidney injury) ([place]) 10/16/2017  . Routine general medical examination at a health care facility 05/10/2014  . Paroxysmal SVT (supraventricular tachycardia) ([place]) 11/28/2013  . Physical deconditioning 11/23/2013  . Generalized weakness 11/17/2013  . Nausea 11/17/2013  . Multilevel spine pain 11/17/2013  . Aortic stenosis, mild 11/17/2013  . DM type 2 (diabetes mellitus, type 2) ([place]) 02/02/2013  . Arthritis 10/19/2012  . Mixed incontinence 10/19/2012  . GERD (gastroesophageal reflux disease) 04/20/2011  . HTN (hypertension) 01/20/2011  . Morbid obesity ([place])     Past Surgical History:  Procedure Laterality Date  . CESAREAN SECTION  1985  . CESAREAN SECTION  1985  . SUPRAVENTRICULAR TACHYCARDIA ABLATION  11/29/2013  . SUPRAVENTRICULAR TACHYCARDIA ABLATION N/A 11/29/2013   Procedure: SUPRAVENTRICULAR TACHYCARDIA ABLATION;  Surgeon: Evans Lance, MD;  Location: [facility] CATH LAB;  Service: Cardiovascular;  Laterality: N/A;  . US ECHOCARDIOGRAPHY  07/25/2008   EF 55-60%  . VAGINAL DELIVERY  OB History   No obstetric history on file.     Family History  Problem Relation Age of Onset  . Alzheimer's disease Mother   . Lung cancer Mother   . COPD Father   . Diabetes Maternal Uncle   . Stroke Neg Hx   . Colon cancer Neg Hx   . Stomach cancer Neg Hx   . Esophageal cancer Neg Hx     Social History   Tobacco Use  . Smoking status: Former Smoker    Packs/day: 2.00    Years: 15.00    Pack years: 30.00     Types: Cigarettes    Quit date: 11/23/1985    Years since quitting: 34.3  . Smokeless tobacco: Never Used  Vaping Use  . Vaping Use: Never used  Substance Use Topics  . Alcohol use: No  . Drug use: No    Home Medications Prior to Admission medications   Medication Sig Start Date End Date Taking? Authorizing Provider  aspirin EC 81 MG tablet Take 81 mg by mouth daily with breakfast. Takes once or twice weekly Patient not taking: Reported on 02/23/2020    [provider]  feeding supplement, ENSURE COMPLETE, (ENSURE COMPLETE) LIQD Take 237 mLs by mouth 2 (two) times daily between meals. Patient not taking: Reported on 02/23/2020 07/28/19   Hoyt Koch, MD  furosemide (LASIX) 40 MG tablet TAKE 1 TABLET BY MOUTH EVERY DAY Patient not taking: Reported on 02/23/2020 04/10/19   Hoyt Koch, MD  losartan (COZAAR) 25 MG tablet TAKE 4 TABLETS (100 MG TOTAL) BY MOUTH DAILY. Patient not taking: Reported on 02/23/2020 05/08/19   Hoyt Koch, MD  metFORMIN (GLUCOPHAGE) 500 MG tablet Take 1 tablet (500 mg total) by mouth daily with breakfast. Patient not taking: Reported on 02/23/2020 05/08/19   Hoyt Koch, MD  metoCLOPramide (REGLAN) 5 MG tablet Take 1 tablet (5 mg total) by mouth 3 (three) times daily as needed for nausea. 02/27/20   Hoyt Koch, MD  metoprolol tartrate (LOPRESSOR) 50 MG tablet TAKE 1 TABLET BY MOUTH TWICE A DAY Patient not taking: Reported on 02/23/2020 05/08/19   Hoyt Koch, MD  montelukast (SINGULAIR) 10 MG tablet Take 1 tablet (10 mg total) by mouth at bedtime. Patient not taking: Reported on 02/23/2020 07/28/19   Hoyt Koch, MD  nutrition supplement, JUVEN, Fanny Dance) PACK Take 1 packet by mouth 2 (two) times daily between meals. Patient not taking: Reported on 02/23/2020 07/28/19   Hoyt Koch, MD  ondansetron (ZOFRAN ODT) 4 MG disintegrating tablet Take 1 tablet (4 mg total) by mouth every 8 (eight)  hours as needed for nausea or vomiting. 03/08/20   Hoyt Koch, MD  ondansetron (ZOFRAN) 4 MG tablet Take 1 tablet (4 mg total) by mouth every 8 (eight) hours as needed for nausea or vomiting. Patient not taking: Reported on 02/23/2020 01/06/19   Hoyt Koch, MD  pantoprazole (PROTONIX) 40 MG tablet TAKE 1 TABLET BY MOUTH EVERY DAY Patient not taking: Reported on 02/23/2020 04/10/19   Hoyt Koch, MD  promethazine (PHENERGAN) 25 MG suppository Place 1 suppository (25 mg total) rectally every 6 (six) hours as needed for nausea or vomiting. 02/27/20   Hoyt Koch, MD    Allergies    Oxycodone hcl and Penicillins  Review of Systems   Review of Systems  Unable to perform ROS: Mental status change    Physical Exam Updated Vital Signs BP 95/72 (BP Location:  Right Arm)   Pulse 72   Temp (!) 92.2 F (33.4 C) (Rectal)   Resp 14   SpO2 99%   Physical Exam Vitals and nursing note reviewed.  Constitutional:      General: She is not in acute distress.    Appearance: She is well-developed and well-nourished.     Comments: Obese. Continues to lay leaning forward with her head on her R leg. Bugs crawling on patient.  HENT:     Head: Normocephalic and atraumatic.  Eyes:     General: No scleral icterus.    Extraocular Movements: EOM normal.     Conjunctiva/sclera: Conjunctivae normal.  Cardiovascular:     Rate and Rhythm: Normal rate and regular rhythm.     Pulses: Normal pulses.  Pulmonary:     Effort: Pulmonary effort is normal. No respiratory distress.     Breath sounds: No stridor. No wheezing.     Comments: Respirations even and unlabored Musculoskeletal:        General: Normal range of motion.     Cervical back: Normal range of motion.  Skin:    General: Skin is warm.     Comments: Various areas of skin breakdown, maceration. No evidence of trauma; no hematoma or contusion to trunk or extremities.  Neurological:     Comments: GCS 14.  Confused. Will follow commands.   Psychiatric:        Mood and Affect: Mood and affect normal.        Behavior: Behavior is slowed.     ED Results / Procedures / Treatments   Labs (all labs ordered are listed, but only abnormal results are displayed) Labs Reviewed  CBC WITH DIFFERENTIAL/PLATELET - Abnormal; Notable for the following components:      Result Value   RBC 5.48 (*)    MCH 24.6 (*)    RDW 20.5 (*)    nRBC 2.0 (*)    Abs Immature Granulocytes 0.19 (*)    All other components within normal limits  COMPREHENSIVE METABOLIC PANEL - Abnormal; Notable for the following components:   Sodium 148 (*)    Glucose, Bld 154 (*)    BUN 80 (*)    Creatinine, Ser 3.46 (*)    Albumin 3.3 (*)    AST 63 (*)    ALT 49 (*)    GFR, Estimated 13 (*)    Anion gap 19 (*)    All other components within normal limits  CK - Abnormal; Notable for the following components:   Total CK 881 (*)    All other components within normal limits  URINALYSIS, ROUTINE W REFLEX MICROSCOPIC - Abnormal; Notable for the following components:   Color, Urine AMBER (*)    APPearance TURBID (*)    Hgb urine dipstick SMALL (*)    Bilirubin Urine MODERATE (*)    Protein, ur 30 (*)    Leukocytes,Ua LARGE (*)    WBC, UA >50 (*)    Bacteria, UA MANY (*)    All other components within normal limits  LACTIC ACID, PLASMA - Abnormal; Notable for the following components:   Lactic Acid, Venous 2.1 (*)    All other components within normal limits  CBG MONITORING, ED - Abnormal; Notable for the following components:   Glucose-Capillary 135 (*)    All other components within normal limits  I-STAT ARTERIAL BLOOD GAS, ED - Abnormal; Notable for the following components:   pH, Arterial 7.472 (*)    pO2, Arterial 121 (*)  Acid-Base Excess 3.0 (*)    Sodium 147 (*)    Potassium 3.1 (*)    All other components within normal limits  RESP PANEL BY RT-PCR (FLU A&B, COVID) ARPGX2  URINE CULTURE  CULTURE, BLOOD (ROUTINE  X 2)  CULTURE, BLOOD (ROUTINE X 2)  ETHANOL  AMMONIA  RAPID URINE DRUG SCREEN, HOSP PERFORMED  BLOOD GAS, ARTERIAL    EKG None  Radiology CT Head Wo Contrast  Result Date: 03/17/2020 CLINICAL DATA:  Mental status change. EXAM: CT HEAD WITHOUT CONTRAST TECHNIQUE: Contiguous axial images were obtained from the base of the skull through the vertex without intravenous contrast. COMPARISON:  October 16, 2017. FINDINGS: Brain: No evidence of acute infarction, hemorrhage, hydrocephalus, extra-axial collection or mass lesion/mass effect. Age related atrophy and chronic microvascular ischemic changes are noted. Vascular: No hyperdense vessel or unexpected calcification. Skull: Normal. Negative for fracture or focal lesion. Sinuses/Orbits: There is mild mucosal thickening of the bilateral maxillary sinuses. The remaining paranasal sinuses and mastoid air cells are essentially clear. Other: None. IMPRESSION: 1. No acute intracranial abnormality. 2. Age related atrophy and chronic microvascular ischemic changes are noted. Electronically Signed   By: Constance Holster M.D.   On: 03/17/2020 03:10   DG Chest Port 1 View  Result Date: 03/16/2020 CLINICAL DATA:  Altered mental status, unresponsive EXAM: PORTABLE CHEST 1 VIEW COMPARISON:  Chest x-ray FINDINGS: The heart size and mediastinal contours are within normal limits. No focal consolidation. No pulmonary edema. No pleural effusion. No pneumothorax. No acute osseous abnormality. IMPRESSION: No active disease. Electronically Signed   By: Iven Finn M.D.   On: 03/16/2020 23:59    Procedures .Critical Care Performed by: Antonietta Breach, PA-C Authorized by: Antonietta Breach, PA-C   Critical care provider statement:    Critical care time (minutes):  45   Critical care was time spent personally by me on the following activities:  Discussions with consultants, evaluation of patient's response to treatment, examination of patient, ordering and performing  treatments and interventions, ordering and review of laboratory studies, ordering and review of radiographic studies, pulse oximetry, re-evaluation of patient's condition, obtaining history from patient or surrogate and review of old charts   (including critical care time)  Medications Ordered in ED Medications  cefTRIAXone (ROCEPHIN) 1 g in sodium chloride 0.9 % 100 mL IVPB (1 g Intravenous New Bag/Given 03/17/20 0413)  sodium chloride 0.9 % bolus 1,000 mL (0 mLs Intravenous Stopped 03/17/20 0356)  sodium chloride 0.9 % bolus 500 mL (500 mLs Intravenous Bolus 03/17/20 0405)    ED Course  I have reviewed the triage vital signs and the nursing notes.  Pertinent labs & imaging results that were available during my care of the patient were reviewed by me and considered in my medical decision making (see chart for details).  Clinical Course as of 03/17/20 4580  Sun Mar 17, 2020  9983 Attempted to contact daughter at number listed; call went straight to voicemail. [KH]    Clinical Course User Index [KH] Beverely Pace   MDM Rules/Calculators/A&P                          74 year old female presents to the emergency department via EMS.  [employer] called out to patient's residence for a well check after daughter had not heard from the patient in 2 days.  She was found lying on the floor and soiled close with excrement on the floor around her.  GCS 14  on arrival, though patient answers questions appropriately and follows commands.  Found to be hypothermic after which time a bair hugger was applied.  Has a new AKI which is suspected secondary to dehydration.  Given IV fluids.  Also with urinary tract infection.  Started on IV Rocephin in the ED.  Urine culture pending.  Plan for admission to hospitalist service.  Dr. Tonie Griffith to assess in the ED for admission.   Final Clinical Impression(s) / ED Diagnoses Final diagnoses:  AKI (acute kidney injury) ([place])  Acute cystitis with hematuria   Hypothermia, initial encounter    Rx / DC Orders ED Discharge Orders    None       Antonietta Breach, PA-C 03/17/20 0421    Ward, Delice Bison, DO 03/17/20 [phone]

## 2020-03-17 ENCOUNTER — Inpatient Hospital Stay (HOSPITAL_COMMUNITY): Payer: PPO

## 2020-03-17 ENCOUNTER — Other Ambulatory Visit: Payer: Self-pay

## 2020-03-17 ENCOUNTER — Encounter (HOSPITAL_COMMUNITY): Payer: Self-pay | Admitting: Family Medicine

## 2020-03-17 ENCOUNTER — Emergency Department (HOSPITAL_COMMUNITY): Payer: PPO

## 2020-03-17 DIAGNOSIS — K269 Duodenal ulcer, unspecified as acute or chronic, without hemorrhage or perforation: Secondary | ICD-10-CM | POA: Diagnosis not present

## 2020-03-17 DIAGNOSIS — N3000 Acute cystitis without hematuria: Secondary | ICD-10-CM | POA: Diagnosis not present

## 2020-03-17 DIAGNOSIS — M2550 Pain in unspecified joint: Secondary | ICD-10-CM | POA: Diagnosis not present

## 2020-03-17 DIAGNOSIS — R410 Disorientation, unspecified: Secondary | ICD-10-CM

## 2020-03-17 DIAGNOSIS — N1831 Chronic kidney disease, stage 3a: Secondary | ICD-10-CM | POA: Diagnosis present

## 2020-03-17 DIAGNOSIS — Z87891 Personal history of nicotine dependence: Secondary | ICD-10-CM | POA: Diagnosis not present

## 2020-03-17 DIAGNOSIS — I130 Hypertensive heart and chronic kidney disease with heart failure and stage 1 through stage 4 chronic kidney disease, or unspecified chronic kidney disease: Secondary | ICD-10-CM | POA: Diagnosis present

## 2020-03-17 DIAGNOSIS — M11262 Other chondrocalcinosis, left knee: Secondary | ICD-10-CM | POA: Diagnosis not present

## 2020-03-17 DIAGNOSIS — E871 Hypo-osmolality and hyponatremia: Secondary | ICD-10-CM | POA: Diagnosis present

## 2020-03-17 DIAGNOSIS — E860 Dehydration: Secondary | ICD-10-CM | POA: Diagnosis present

## 2020-03-17 DIAGNOSIS — F802 Mixed receptive-expressive language disorder: Secondary | ICD-10-CM | POA: Diagnosis not present

## 2020-03-17 DIAGNOSIS — E43 Unspecified severe protein-calorie malnutrition: Secondary | ICD-10-CM | POA: Diagnosis present

## 2020-03-17 DIAGNOSIS — Z820 Family history of epilepsy and other diseases of the nervous system: Secondary | ICD-10-CM | POA: Diagnosis not present

## 2020-03-17 DIAGNOSIS — G9341 Metabolic encephalopathy: Secondary | ICD-10-CM | POA: Diagnosis present

## 2020-03-17 DIAGNOSIS — E870 Hyperosmolality and hypernatremia: Secondary | ICD-10-CM | POA: Diagnosis present

## 2020-03-17 DIAGNOSIS — M170 Bilateral primary osteoarthritis of knee: Secondary | ICD-10-CM | POA: Diagnosis not present

## 2020-03-17 DIAGNOSIS — M11261 Other chondrocalcinosis, right knee: Secondary | ICD-10-CM | POA: Diagnosis not present

## 2020-03-17 DIAGNOSIS — R4182 Altered mental status, unspecified: Secondary | ICD-10-CM | POA: Diagnosis not present

## 2020-03-17 DIAGNOSIS — Z833 Family history of diabetes mellitus: Secondary | ICD-10-CM | POA: Diagnosis not present

## 2020-03-17 DIAGNOSIS — R509 Fever, unspecified: Secondary | ICD-10-CM | POA: Diagnosis not present

## 2020-03-17 DIAGNOSIS — R112 Nausea with vomiting, unspecified: Secondary | ICD-10-CM | POA: Diagnosis present

## 2020-03-17 DIAGNOSIS — N189 Chronic kidney disease, unspecified: Secondary | ICD-10-CM | POA: Diagnosis not present

## 2020-03-17 DIAGNOSIS — Z20822 Contact with and (suspected) exposure to covid-19: Secondary | ICD-10-CM | POA: Diagnosis present

## 2020-03-17 DIAGNOSIS — Z79899 Other long term (current) drug therapy: Secondary | ICD-10-CM | POA: Diagnosis not present

## 2020-03-17 DIAGNOSIS — Z801 Family history of malignant neoplasm of trachea, bronchus and lung: Secondary | ICD-10-CM | POA: Diagnosis not present

## 2020-03-17 DIAGNOSIS — A419 Sepsis, unspecified organism: Secondary | ICD-10-CM | POA: Diagnosis not present

## 2020-03-17 DIAGNOSIS — N390 Urinary tract infection, site not specified: Secondary | ICD-10-CM | POA: Diagnosis present

## 2020-03-17 DIAGNOSIS — R1310 Dysphagia, unspecified: Secondary | ICD-10-CM | POA: Diagnosis not present

## 2020-03-17 DIAGNOSIS — R4701 Aphasia: Secondary | ICD-10-CM | POA: Diagnosis present

## 2020-03-17 DIAGNOSIS — N179 Acute kidney failure, unspecified: Secondary | ICD-10-CM

## 2020-03-17 DIAGNOSIS — Z885 Allergy status to narcotic agent status: Secondary | ICD-10-CM | POA: Diagnosis not present

## 2020-03-17 DIAGNOSIS — N3001 Acute cystitis with hematuria: Secondary | ICD-10-CM | POA: Diagnosis present

## 2020-03-17 DIAGNOSIS — K209 Esophagitis, unspecified without bleeding: Secondary | ICD-10-CM | POA: Diagnosis not present

## 2020-03-17 DIAGNOSIS — F05 Delirium due to known physiological condition: Secondary | ICD-10-CM | POA: Diagnosis present

## 2020-03-17 DIAGNOSIS — R6520 Severe sepsis without septic shock: Secondary | ICD-10-CM | POA: Diagnosis not present

## 2020-03-17 DIAGNOSIS — I890 Lymphedema, not elsewhere classified: Secondary | ICD-10-CM | POA: Diagnosis present

## 2020-03-17 DIAGNOSIS — E512 Wernicke's encephalopathy: Secondary | ICD-10-CM | POA: Diagnosis present

## 2020-03-17 DIAGNOSIS — Z825 Family history of asthma and other chronic lower respiratory diseases: Secondary | ICD-10-CM | POA: Diagnosis not present

## 2020-03-17 DIAGNOSIS — T68XXXA Hypothermia, initial encounter: Secondary | ICD-10-CM

## 2020-03-17 DIAGNOSIS — R680 Hypothermia, not associated with low environmental temperature: Secondary | ICD-10-CM | POA: Diagnosis present

## 2020-03-17 DIAGNOSIS — E119 Type 2 diabetes mellitus without complications: Secondary | ICD-10-CM | POA: Diagnosis not present

## 2020-03-17 DIAGNOSIS — Z880 Allergy status to penicillin: Secondary | ICD-10-CM | POA: Diagnosis not present

## 2020-03-17 LAB — RESP PANEL BY RT-PCR (FLU A&B, COVID) ARPGX2
Influenza A by PCR: NEGATIVE
Influenza B by PCR: NEGATIVE
SARS Coronavirus 2 by RT PCR: NEGATIVE

## 2020-03-17 LAB — COMPREHENSIVE METABOLIC PANEL
ALT: 49 U/L — ABNORMAL HIGH (ref 0–44)
AST: 63 U/L — ABNORMAL HIGH (ref 15–41)
Albumin: 3.3 g/dL — ABNORMAL LOW (ref 3.5–5.0)
Alkaline Phosphatase: 51 U/L (ref 38–126)
Anion gap: 19 — ABNORMAL HIGH (ref 5–15)
BUN: 80 mg/dL — ABNORMAL HIGH (ref 8–23)
CO2: 26 mmol/L (ref 22–32)
Calcium: 9.9 mg/dL (ref 8.9–10.3)
Chloride: 103 mmol/L (ref 98–111)
Creatinine, Ser: 3.46 mg/dL — ABNORMAL HIGH (ref 0.44–1.00)
GFR, Estimated: 13 mL/min — ABNORMAL LOW (ref >60)
Glucose, Bld: 154 mg/dL — ABNORMAL HIGH (ref 70–99)
Potassium: 3.8 mmol/L (ref 3.5–5.1)
Sodium: 148 mmol/L — ABNORMAL HIGH (ref 135–145)
Total Bilirubin: 0.9 mg/dL (ref 0.3–1.2)
Total Protein: 7.4 g/dL (ref 6.5–8.1)

## 2020-03-17 LAB — CBC
HCT: 37.3 % (ref 36.0–46.0)
Hemoglobin: 11.5 g/dL — ABNORMAL LOW (ref 12.0–15.0)
MCH: 24.7 pg — ABNORMAL LOW (ref 26.0–34.0)
MCHC: 30.8 g/dL (ref 30.0–36.0)
MCV: 80.2 fL (ref 80.0–100.0)
Platelets: 206 10*3/uL (ref 150–400)
RBC: 4.65 MIL/uL (ref 3.87–5.11)
RDW: 20.3 % — ABNORMAL HIGH (ref 11.5–15.5)
WBC: 8.2 10*3/uL (ref 4.0–10.5)
nRBC: 2.3 % — ABNORMAL HIGH (ref 0.0–0.2)

## 2020-03-17 LAB — CBC WITH DIFFERENTIAL/PLATELET
Abs Immature Granulocytes: 0.19 10*3/uL — ABNORMAL HIGH (ref 0.00–0.07)
Basophils Absolute: 0.1 10*3/uL (ref 0.0–0.1)
Basophils Relative: 1 %
Eosinophils Absolute: 0 10*3/uL (ref 0.0–0.5)
Eosinophils Relative: 0 %
HCT: 44.1 % (ref 36.0–46.0)
Hemoglobin: 13.5 g/dL (ref 12.0–15.0)
Immature Granulocytes: 2 %
Lymphocytes Relative: 11 %
Lymphs Abs: 0.9 10*3/uL (ref 0.7–4.0)
MCH: 24.6 pg — ABNORMAL LOW (ref 26.0–34.0)
MCHC: 30.6 g/dL (ref 30.0–36.0)
MCV: 80.5 fL (ref 80.0–100.0)
Monocytes Absolute: 0.4 10*3/uL (ref 0.1–1.0)
Monocytes Relative: 4 %
Neutro Abs: 6.9 10*3/uL (ref 1.7–7.7)
Neutrophils Relative %: 82 %
Platelets: 231 10*3/uL (ref 150–400)
RBC: 5.48 MIL/uL — ABNORMAL HIGH (ref 3.87–5.11)
RDW: 20.5 % — ABNORMAL HIGH (ref 11.5–15.5)
WBC: 8.4 10*3/uL (ref 4.0–10.5)
nRBC: 2 % — ABNORMAL HIGH (ref 0.0–0.2)

## 2020-03-17 LAB — CK
Total CK: 881 U/L — ABNORMAL HIGH (ref 38–234)
Total CK: 436 U/L — ABNORMAL HIGH (ref 38–234)

## 2020-03-17 LAB — RAPID URINE DRUG SCREEN, HOSP PERFORMED
Amphetamines: NOT DETECTED
Barbiturates: NOT DETECTED
Benzodiazepines: NOT DETECTED
Cocaine: NOT DETECTED
Opiates: NOT DETECTED
Tetrahydrocannabinol: NOT DETECTED

## 2020-03-17 LAB — URINALYSIS, ROUTINE W REFLEX MICROSCOPIC
Glucose, UA: NEGATIVE mg/dL
Ketones, ur: NEGATIVE mg/dL
Nitrite: NEGATIVE
Protein, ur: 30 mg/dL — AB
Specific Gravity, Urine: 1.023 (ref 1.005–1.030)
WBC, UA: >50 WBC/hpf — ABNORMAL HIGH (ref 0–5)
pH: 5 (ref 5.0–8.0)

## 2020-03-17 LAB — ETHANOL: Alcohol, Ethyl (B): <10 mg/dL (ref <10)

## 2020-03-17 LAB — CBG MONITORING, ED
Glucose-Capillary: 135 mg/dL — ABNORMAL HIGH (ref 70–99)
Glucose-Capillary: 105 mg/dL — ABNORMAL HIGH (ref 70–99)
Glucose-Capillary: 99 mg/dL (ref 70–99)

## 2020-03-17 LAB — LACTIC ACID, PLASMA: Lactic Acid, Venous: 2.1 mmol/L (ref 0.5–1.9)

## 2020-03-17 LAB — AMMONIA: Ammonia: 23 umol/L (ref 9–35)

## 2020-03-17 LAB — GLUCOSE, CAPILLARY
Glucose-Capillary: 100 mg/dL — ABNORMAL HIGH (ref 70–99)
Glucose-Capillary: 113 mg/dL — ABNORMAL HIGH (ref 70–99)

## 2020-03-17 LAB — TSH: TSH: 1.622 u[IU]/mL (ref 0.350–4.500)

## 2020-03-17 LAB — CREATININE, SERUM
Creatinine, Ser: 3.08 mg/dL — ABNORMAL HIGH (ref 0.44–1.00)
GFR, Estimated: 15 mL/min — ABNORMAL LOW (ref >60)

## 2020-03-17 LAB — CORTISOL-AM, BLOOD: Cortisol - AM: 25.8 ug/dL — ABNORMAL HIGH (ref 6.7–22.6)

## 2020-03-17 IMAGING — CT CT HEAD W/O CM
4 series · 17 of 47 positions shown, 19 images · non-contrast
Comparison: [DATE].

CLINICAL DATA: Mental status change.

EXAM:
CT HEAD WITHOUT CONTRAST
TECHNIQUE: Contiguous axial images were obtained from the base of the skull
through the vertex without intravenous contrast.

[Series 3: head wo · axial · 0.43mm/px · z∈[-78,+42]mm · 7 of 34 slices shown, 9 images]
[im 5/34  brain]
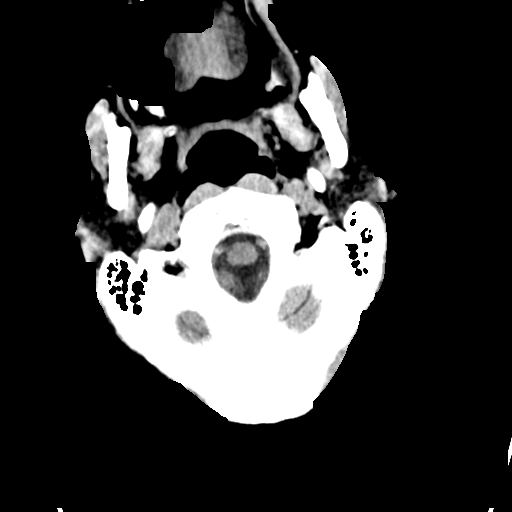
[im 5/34  bone]
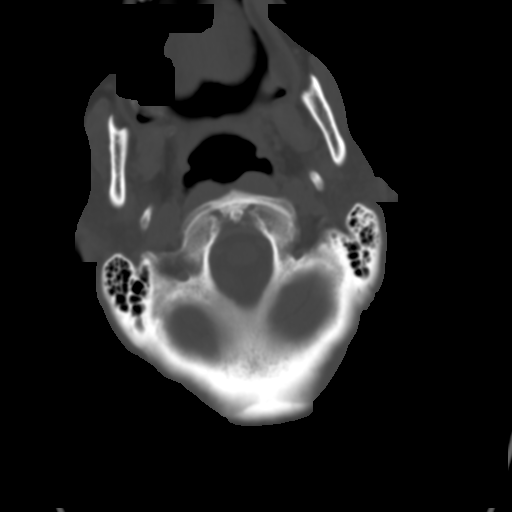
[im 9/34  brain]
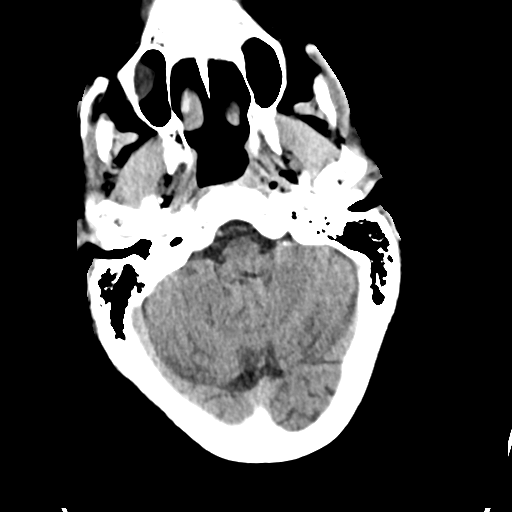
[im 13/34  brain]
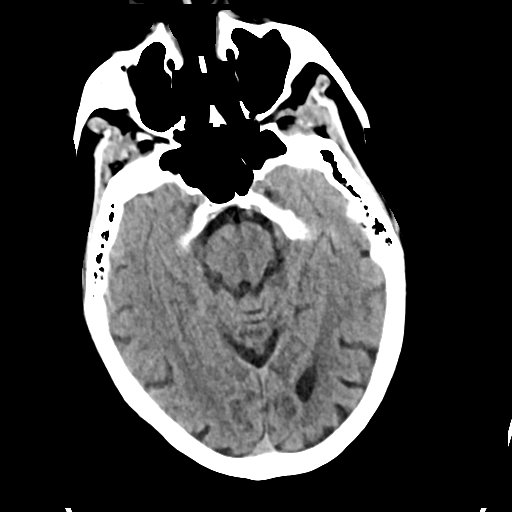
[im 17/34  brain]
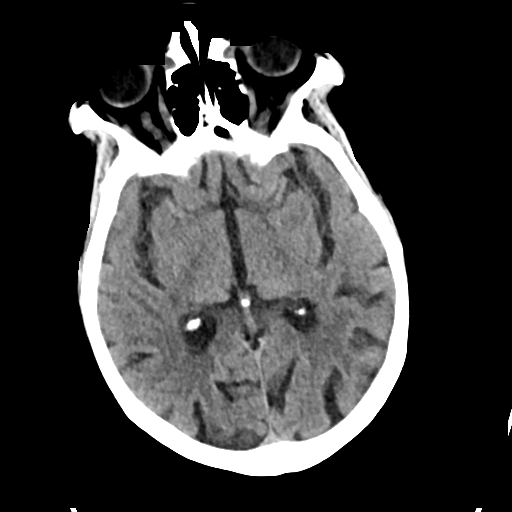
[im 21/34  brain]
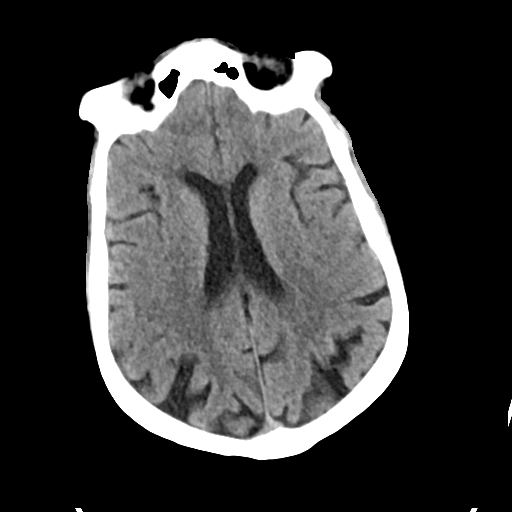
[im 21/34  bone]
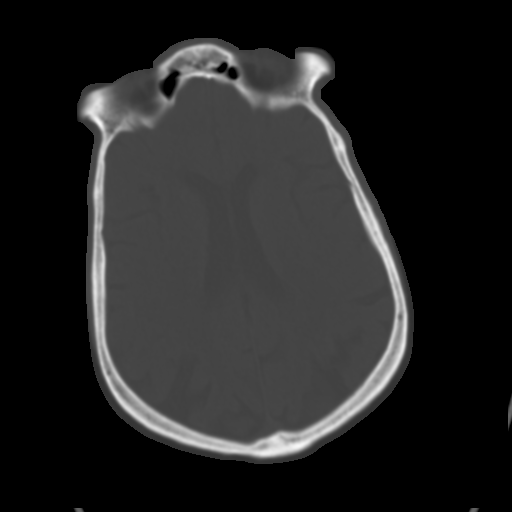
[im 25/34  brain]
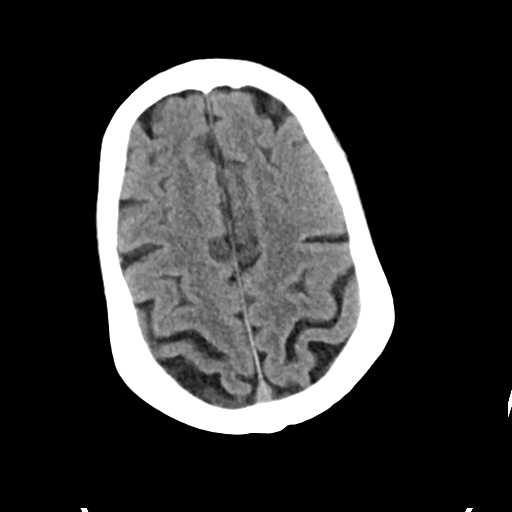
[im 29/34  brain]
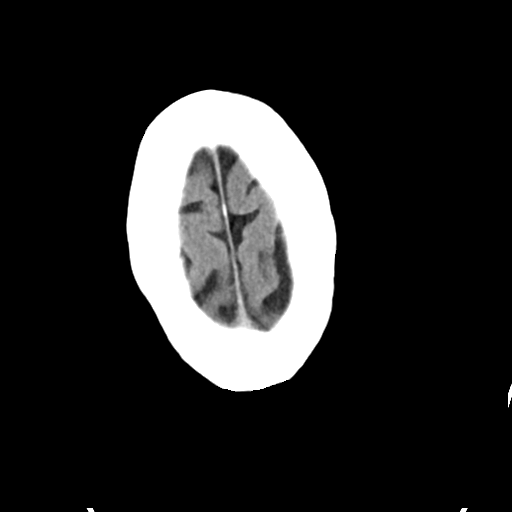

[Series 4: head bone · axial · 0.43mm/px · z∈[-82,-24]mm · 4 of 85 slices shown]
[im 9/85  bone]
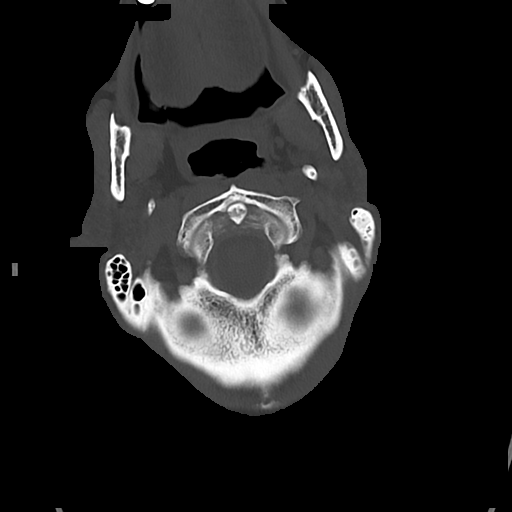
[im 17/85  bone]
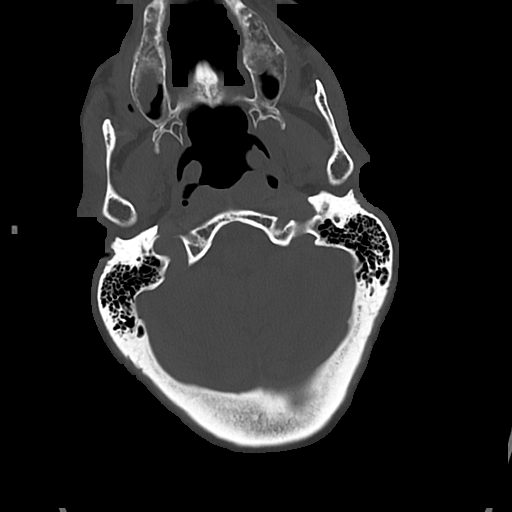
[im 26/85  bone]
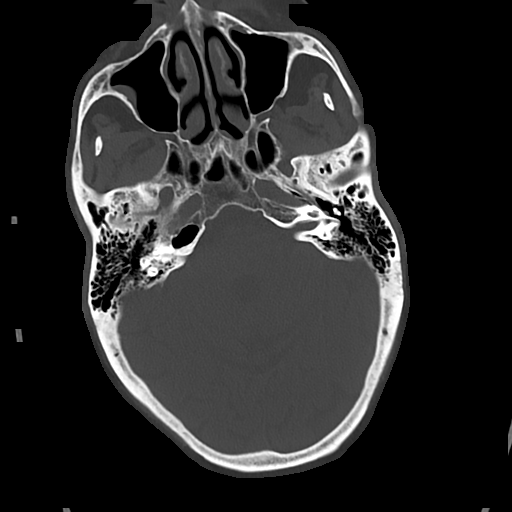
[im 38/85  bone]
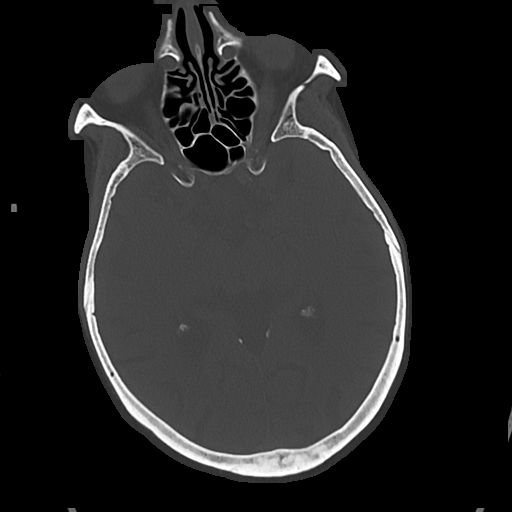

[Series 5: cor soft · coronal · 0.35mm/px · 3 of 72 slices shown]
[im 25/72  brain]
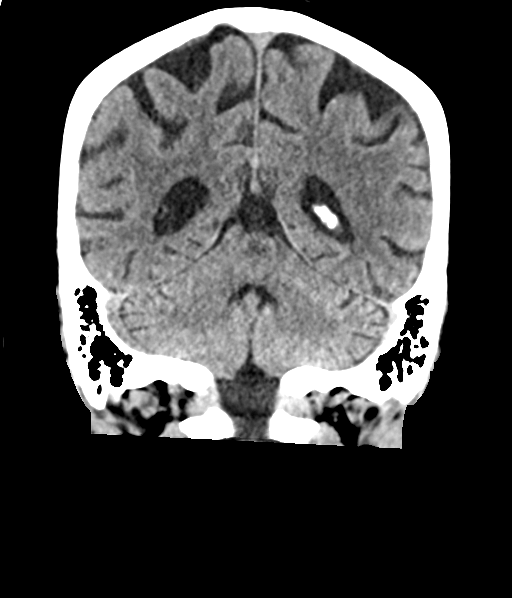
[im 32/72  brain]
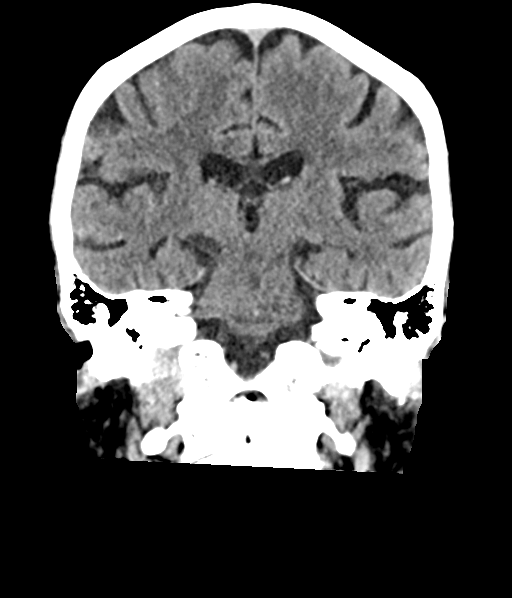
[im 40/72  brain]
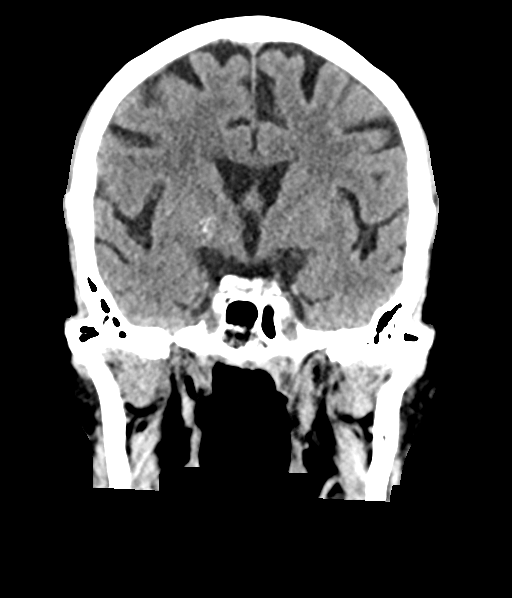

[Series 6: sag soft · sagittal · 0.41mm/px · 3 of 60 slices shown]
[im 20/60  brain]
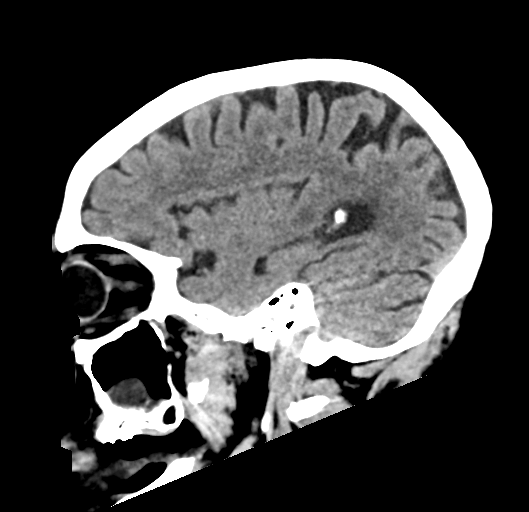
[im 30/60  brain]
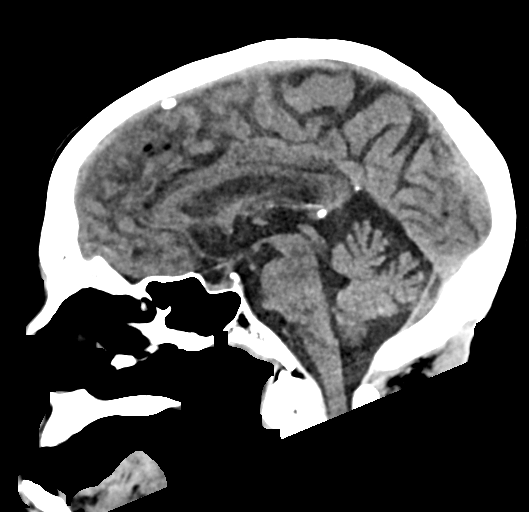
[im 40/60  brain]
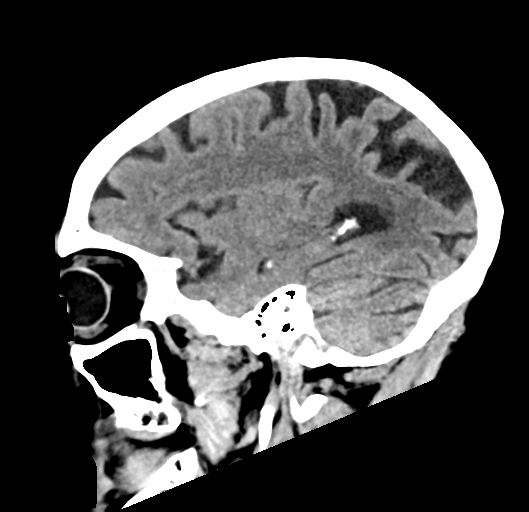

[17 of 47 positions shown; findings below may reference images not displayed]

FINDINGS: Brain: No evidence of acute infarction, hemorrhage, hydrocephalus,
extra-axial collection or mass lesion/mass effect. Age related
atrophy and chronic microvascular ischemic changes are noted.

Vascular: No hyperdense vessel or unexpected calcification.

Skull: Normal. Negative for fracture or focal lesion.

Sinuses/Orbits: There is mild mucosal thickening of the bilateral
maxillary sinuses. The remaining paranasal sinuses and mastoid air
cells are essentially clear.

Other: None.
IMPRESSION: 1. No acute intracranial abnormality.
2. Age related atrophy and chronic microvascular ischemic changes
are noted.

## 2020-03-17 IMAGING — DX DG HIP (WITH OR WITHOUT PELVIS) 2-3V*L*
2 series · 3 of 3 positions shown · non-contrast
Comparison: None.

CLINICAL DATA: Left hip pain.

EXAM:
DG HIP (WITH OR WITHOUT PELVIS) 2-3V LEFT

[pelvis ap]
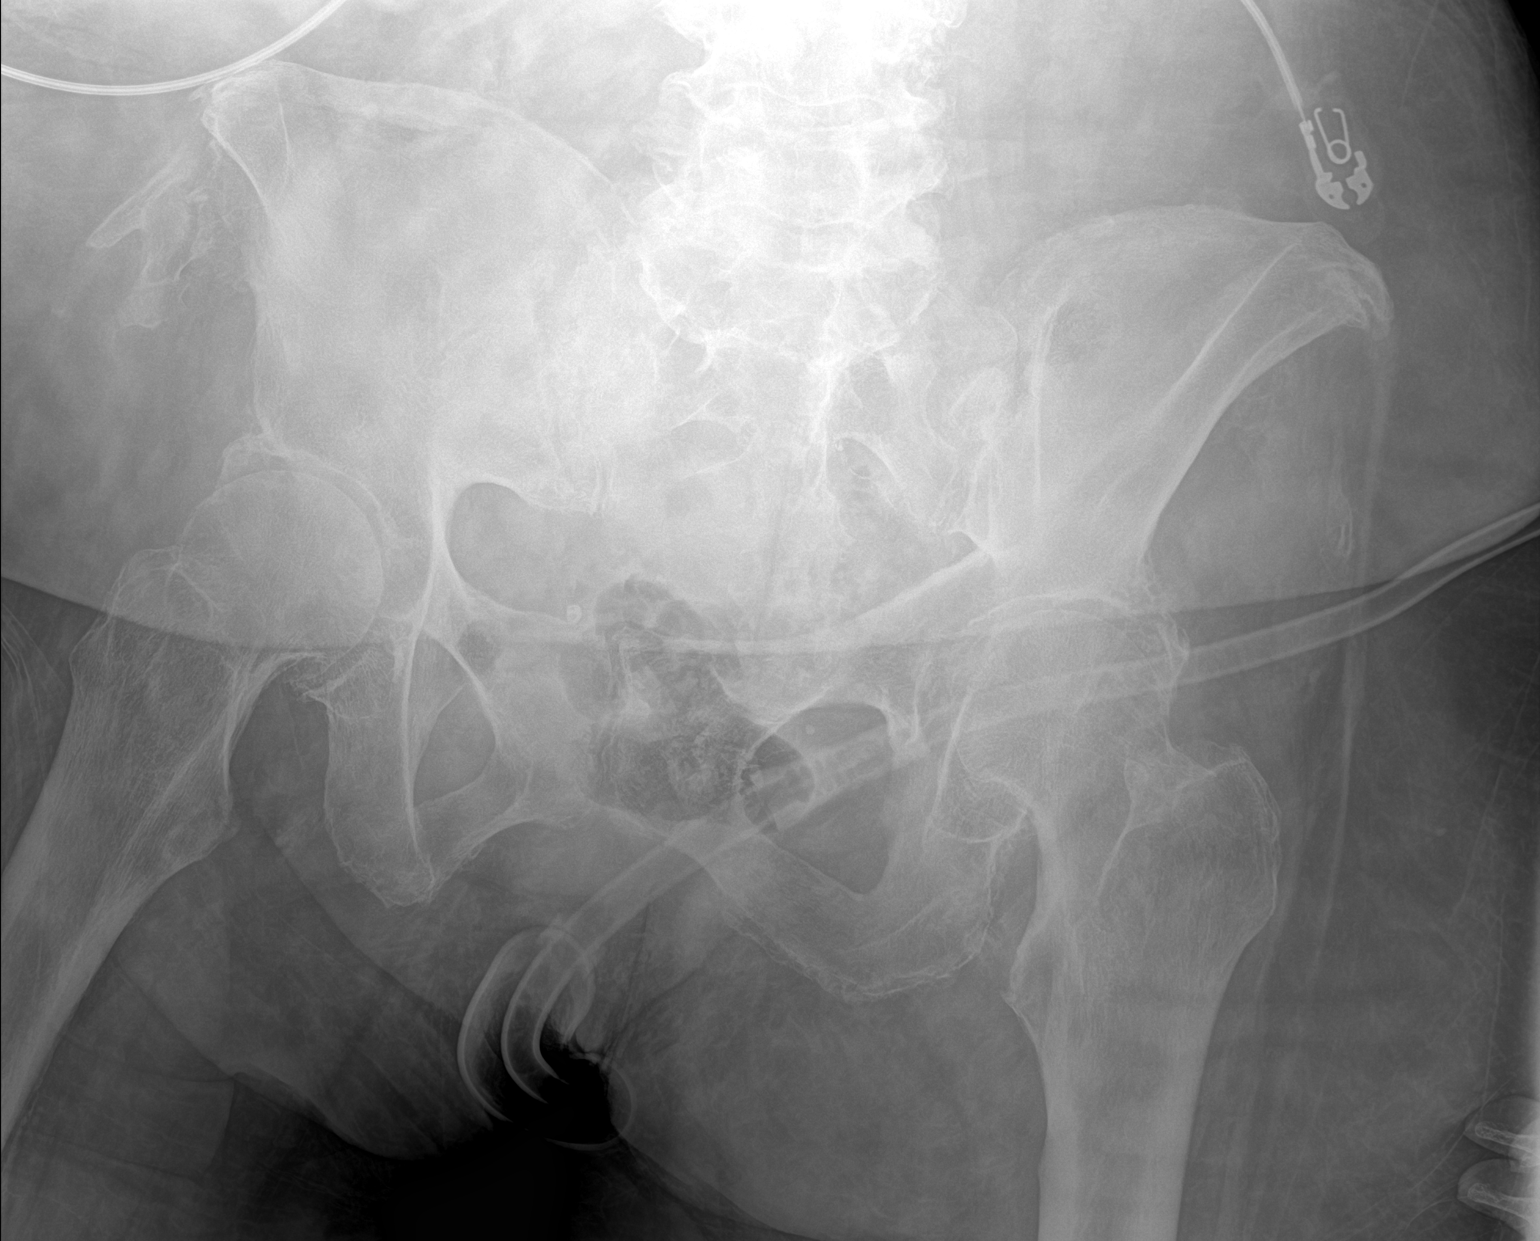

[Series 2: hip · 0.14mm/px · 2 of 2 slices shown]
[im 1/2]
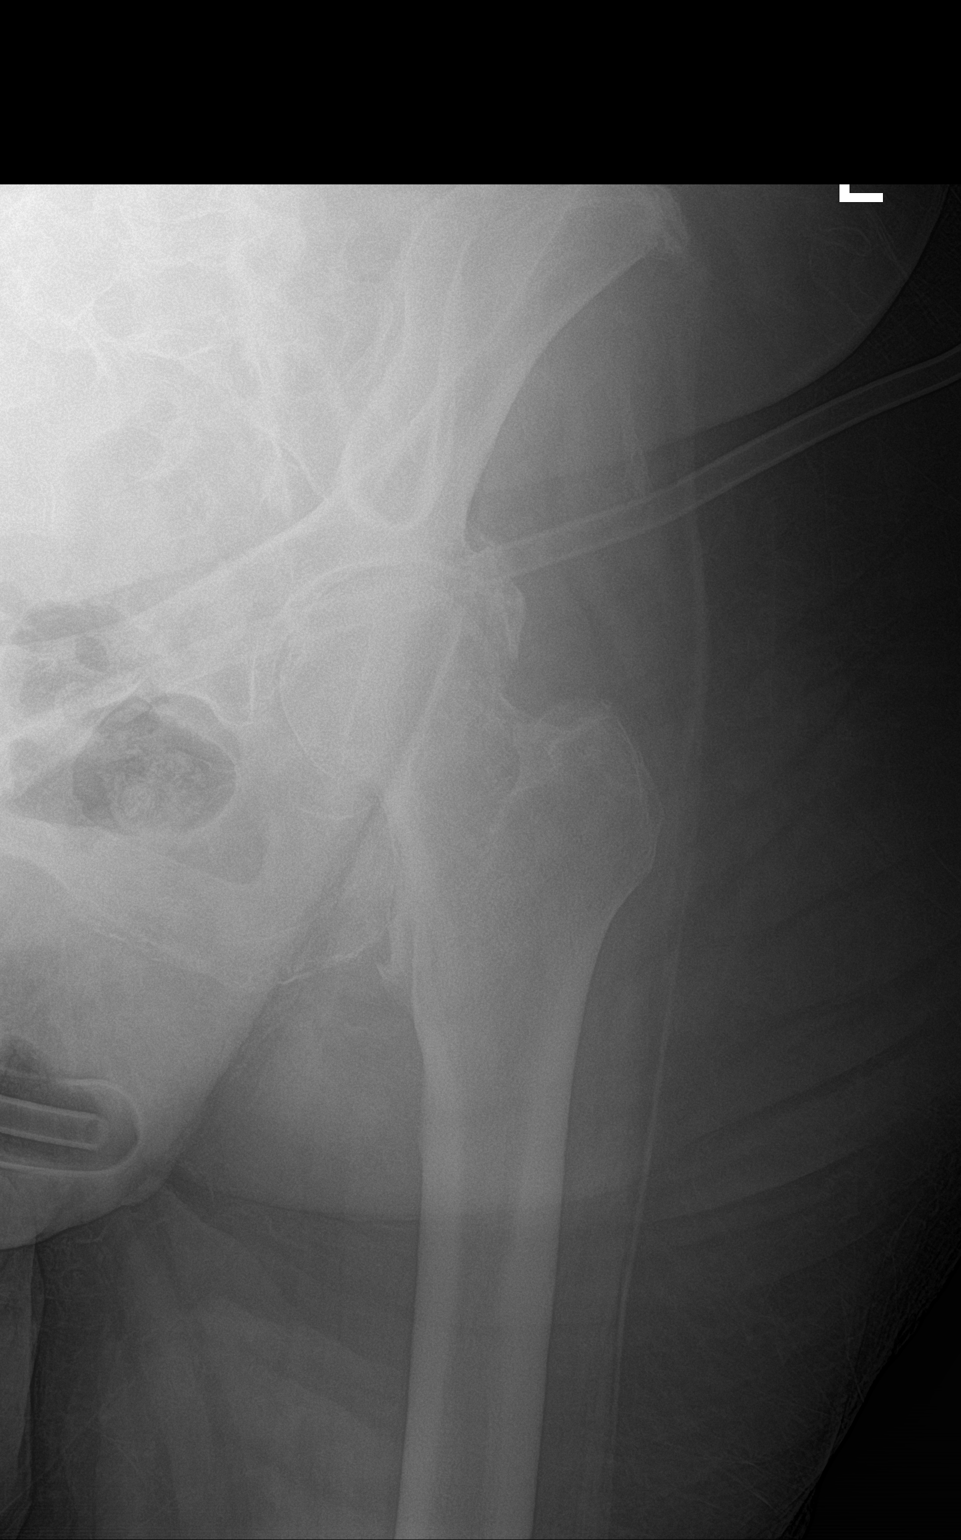
[im 2/2]
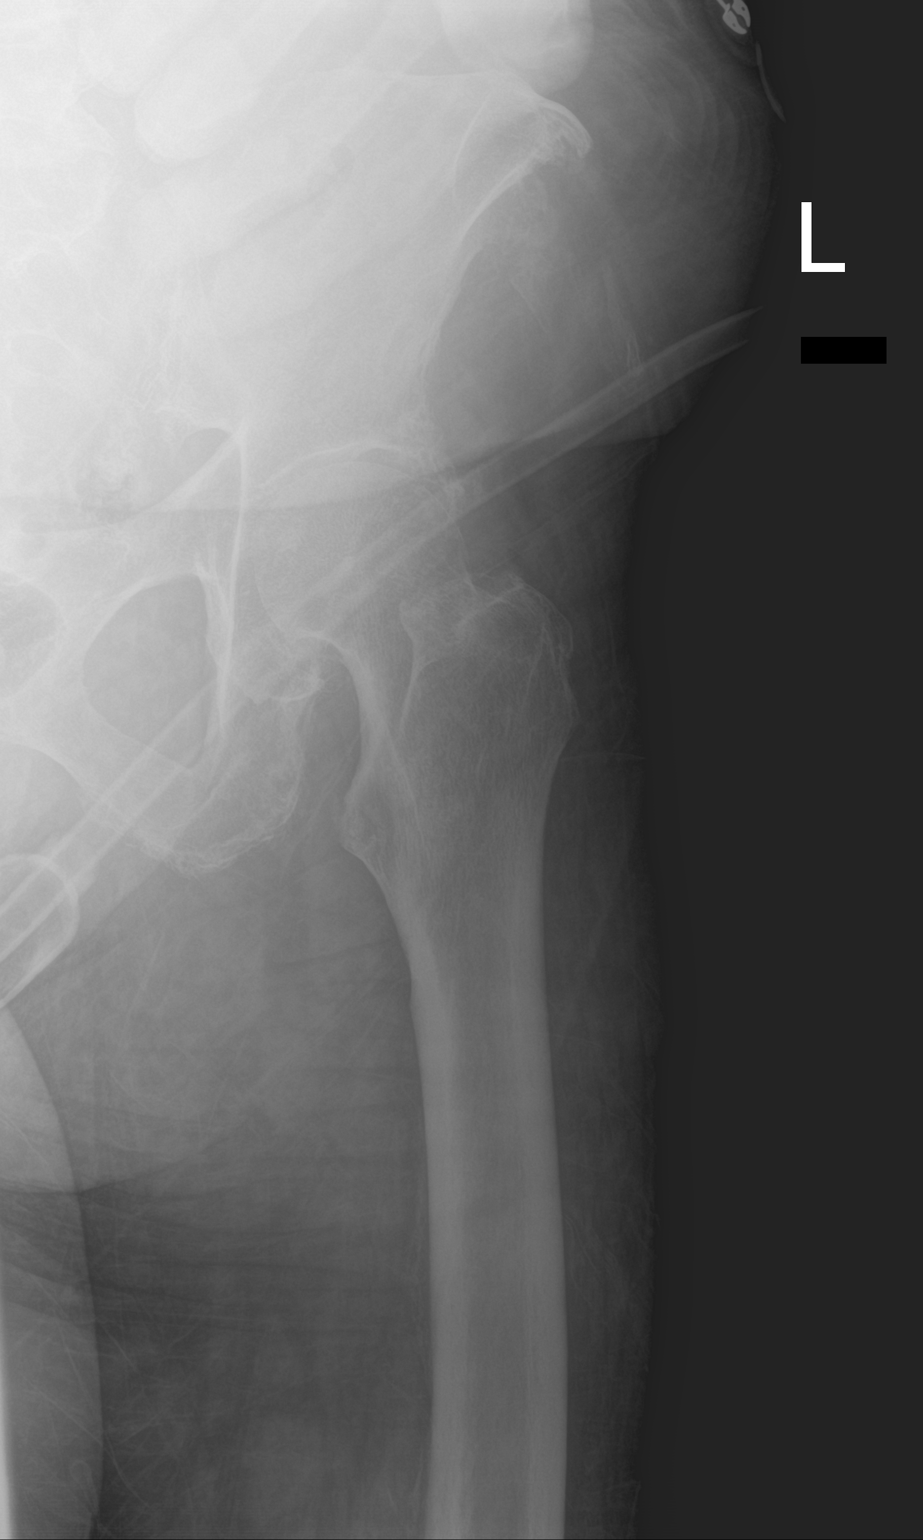

[3 of 3 positions shown; findings below may reference images not displayed]

FINDINGS: There is no evidence of hip fracture or dislocation. Moderate
osteophyte formation of left hip is noted.
IMPRESSION: Moderate osteoarthritis of left hip. No acute abnormality seen.

## 2020-03-17 MED ORDER — INSULIN ASPART 100 UNIT/ML ~~LOC~~ SOLN
0.0000 [IU] | Freq: Three times a day (TID) | SUBCUTANEOUS | Status: DC
  Administered 2020-03-19: 1 [IU] via SUBCUTANEOUS
  Administered 2020-03-20: 2 [IU] via SUBCUTANEOUS
  Administered 2020-03-20 – 2020-03-30 (×5): 1 [IU] via SUBCUTANEOUS
  Administered 2020-04-01: 2 [IU] via SUBCUTANEOUS
  Administered 2020-04-02: 3 [IU] via SUBCUTANEOUS
  Administered 2020-04-05 – 2020-04-06 (×3): 1 [IU] via SUBCUTANEOUS
  Administered 2020-04-06: 2 [IU] via SUBCUTANEOUS
  Administered 2020-04-06 – 2020-04-07 (×3): 1 [IU] via SUBCUTANEOUS
  Administered 2020-04-08: 2 [IU] via SUBCUTANEOUS

## 2020-03-17 MED ORDER — SODIUM CHLORIDE 0.9 % IV SOLN
1.0000 g | Freq: Once | INTRAVENOUS | Status: AC
  Administered 2020-03-17: 1 g via INTRAVENOUS
  Filled 2020-03-17: qty 10

## 2020-03-17 MED ORDER — HEPARIN SODIUM (PORCINE) 5000 UNIT/ML IJ SOLN
5000.0000 [IU] | Freq: Three times a day (TID) | INTRAMUSCULAR | Status: DC
  Administered 2020-03-17 – 2020-03-24 (×20): 5000 [IU] via SUBCUTANEOUS
  Filled 2020-03-17 (×21): qty 1

## 2020-03-17 MED ORDER — SODIUM CHLORIDE 0.9 % IV BOLUS
500.0000 mL | Freq: Once | INTRAVENOUS | Status: AC
  Administered 2020-03-17: 500 mL via INTRAVENOUS

## 2020-03-17 MED ORDER — SODIUM CHLORIDE 0.9 % IV BOLUS
1000.0000 mL | Freq: Once | INTRAVENOUS | Status: AC
  Administered 2020-03-17: 1000 mL via INTRAVENOUS

## 2020-03-17 MED ORDER — HYDROMORPHONE HCL 1 MG/ML IJ SOLN
INTRAMUSCULAR | Status: AC
  Administered 2020-03-17: 0.5 mg via INTRAVENOUS
  Filled 2020-03-17: qty 1

## 2020-03-17 MED ORDER — LACTATED RINGERS IV SOLN: INTRAVENOUS | Status: DC

## 2020-03-17 MED ORDER — INSULIN ASPART 100 UNIT/ML ~~LOC~~ SOLN
0.0000 [IU] | Freq: Every day | SUBCUTANEOUS | Status: DC
  Administered 2020-04-05 – 2020-04-06 (×2): 2 [IU] via SUBCUTANEOUS

## 2020-03-17 MED ORDER — ACETAMINOPHEN 325 MG PO TABS
650.0000 mg | ORAL_TABLET | Freq: Four times a day (QID) | ORAL | Status: DC | PRN
  Administered 2020-03-21 – 2020-04-06 (×10): 650 mg via ORAL
  Filled 2020-03-17 (×9): qty 2

## 2020-03-17 MED ORDER — ACETAMINOPHEN 650 MG RE SUPP: 650.0000 mg | Freq: Four times a day (QID) | RECTAL | Status: DC | PRN

## 2020-03-17 MED ORDER — HYDROMORPHONE HCL 1 MG/ML IJ SOLN
0.5000 mg | INTRAMUSCULAR | Status: DC | PRN
  Administered 2020-03-17 – 2020-04-01 (×15): 0.5 mg via INTRAVENOUS
  Filled 2020-03-17 (×17): qty 1

## 2020-03-17 MED ORDER — SODIUM CHLORIDE 0.9 % IV SOLN
1.0000 g | INTRAVENOUS | Status: DC
  Administered 2020-03-18 – 2020-03-19 (×2): 1 g via INTRAVENOUS
  Filled 2020-03-17 (×2): qty 1

## 2020-03-17 NOTE — H&P (Signed)
History and Physical    Sueanne Maniaci QJJ:[id number] DOB: [birth year] DOA: 03/16/2020  PCP: Hoyt Koch, MD   Patient coming from:  Lives at an [facility] living facility.  Chief Complaint:     HPI: [name] [name] is a 74 y.o. female with medical history significant for DMT2, CKD, CHF, HTN, OA who presents by EMS. Her daughter had not been able to get in touch with Ms. Jahn for past two days and she called the police to do a safety check on her.  Found by police sitting on the floor, leaning forward in the central living space with bugs crawling on her. Patient was next to her walker sitting in her soiled clothes with excrement on the floor around her. Patient reportedly denied pain including chest pain, back pain, abdominal pain and SOB. Pt is now sleeping on gurney in ER and does not respond to questions or follow commands. ER provider reported that pt was initially confused when she arrived. Per chart review, patient seen on 03/08/20 by her PCP for intractable N/V x 4-6 weeks with profound weakness, lightheadedness. Found to have UTI and started on antibiotic therapy.  Pt not able to provide any history at this time. No family at bedside.   Review of Systems:  Review of systems not obtainable secondary to patient's acute medical condition and confusion.  Past Medical History:  Diagnosis Date  . Acute kidney injury ([place])   . Aortic stenosis, mild 11/17/2013  . Arthritis    "both knees, spine, back, fingers" (11/29/2013)  . CHF (congestive heart failure) ([place])   . Edema   . GERD (gastroesophageal reflux disease)   . Heart murmur   . History of diastolic dysfunction    Echo 07/8183 with diastolic dysfunction  . HTN (hypertension)   . Morbid obesity ([place])   . OA (osteoarthritis)   . PAC (premature atrial contraction)    per prior Holter  . Palpitations   . Pneumonia    "as a child"  . PVC's (premature ventricular contractions)    per prior Holter  . SVT  (supraventricular tachycardia) ([place])   . Tachycardia    noted at 07/28/11 visit. Started on beta blocker. Possible atrial flutter vs long PR tachycardia/AVNRT  . Type II diabetes mellitus ([place])     Past Surgical History:  Procedure Laterality Date  . CESAREAN SECTION  1985  . CESAREAN SECTION  1985  . SUPRAVENTRICULAR TACHYCARDIA ABLATION  11/29/2013  . SUPRAVENTRICULAR TACHYCARDIA ABLATION N/A 11/29/2013   Procedure: SUPRAVENTRICULAR TACHYCARDIA ABLATION;  Surgeon: Evans Lance, MD;  Location: [facility] CATH LAB;  Service: Cardiovascular;  Laterality: N/A;  . US ECHOCARDIOGRAPHY  07/25/2008   EF 55-60%  . VAGINAL DELIVERY      Social History  reports that she quit smoking about 34 years ago. Her smoking use included cigarettes. She has a 30.00 pack-year smoking history. She has never used smokeless tobacco. She reports that she does not drink alcohol and does not use drugs.  Allergies  Allergen Reactions  . Oxycodone Hcl Nausea And Vomiting  . Penicillins Itching    Has patient had a PCN reaction causing immediate rash, facial/tongue/throat swelling, SOB or lightheadedness with hypotension: No Has patient had a PCN reaction causing severe rash involving mucus membranes or skin necrosis: No Has patient had a PCN reaction that required hospitalization: No Has patient had a PCN reaction occurring within the last 10 years: No If all of the above answers are "NO", then may proceed with Cephalosporin  use.    Family History  Problem Relation Age of Onset  . Alzheimer's disease Mother   . Lung cancer Mother   . COPD Father   . Diabetes Maternal Uncle   . Stroke Neg Hx   . Colon cancer Neg Hx   . Stomach cancer Neg Hx   . Esophageal cancer Neg Hx      Prior to Admission medications   Medication Sig Start Date End Date Taking? Authorizing Provider  aspirin EC 81 MG tablet Take 81 mg by mouth daily with breakfast. Takes once or twice weekly Patient not taking: Reported on 02/23/2020     [provider]  feeding supplement, ENSURE COMPLETE, (ENSURE COMPLETE) LIQD Take 237 mLs by mouth 2 (two) times daily between meals. Patient not taking: Reported on 02/23/2020 07/28/19   Hoyt Koch, MD  furosemide (LASIX) 40 MG tablet TAKE 1 TABLET BY MOUTH EVERY DAY Patient not taking: Reported on 02/23/2020 04/10/19   Hoyt Koch, MD  losartan (COZAAR) 25 MG tablet TAKE 4 TABLETS (100 MG TOTAL) BY MOUTH DAILY. Patient not taking: Reported on 02/23/2020 05/08/19   Hoyt Koch, MD  metFORMIN (GLUCOPHAGE) 500 MG tablet Take 1 tablet (500 mg total) by mouth daily with breakfast. Patient not taking: Reported on 02/23/2020 05/08/19   Hoyt Koch, MD  metoCLOPramide (REGLAN) 5 MG tablet Take 1 tablet (5 mg total) by mouth 3 (three) times daily as needed for nausea. 02/27/20   Hoyt Koch, MD  metoprolol tartrate (LOPRESSOR) 50 MG tablet TAKE 1 TABLET BY MOUTH TWICE A DAY Patient not taking: Reported on 02/23/2020 05/08/19   Hoyt Koch, MD  montelukast (SINGULAIR) 10 MG tablet Take 1 tablet (10 mg total) by mouth at bedtime. Patient not taking: Reported on 02/23/2020 07/28/19   Hoyt Koch, MD  nutrition supplement, JUVEN, Fanny Dance) PACK Take 1 packet by mouth 2 (two) times daily between meals. Patient not taking: Reported on 02/23/2020 07/28/19   Hoyt Koch, MD  ondansetron (ZOFRAN ODT) 4 MG disintegrating tablet Take 1 tablet (4 mg total) by mouth every 8 (eight) hours as needed for nausea or vomiting. 03/08/20   Hoyt Koch, MD  ondansetron (ZOFRAN) 4 MG tablet Take 1 tablet (4 mg total) by mouth every 8 (eight) hours as needed for nausea or vomiting. Patient not taking: Reported on 02/23/2020 01/06/19   Hoyt Koch, MD  pantoprazole (PROTONIX) 40 MG tablet TAKE 1 TABLET BY MOUTH EVERY DAY Patient not taking: Reported on 02/23/2020 04/10/19   Hoyt Koch, MD  promethazine (PHENERGAN) 25 MG  suppository Place 1 suppository (25 mg total) rectally every 6 (six) hours as needed for nausea or vomiting. 02/27/20   Hoyt Koch, MD    Physical Exam: Vitals:   03/17/20 0345 03/17/20 0400 03/17/20 0415 03/17/20 0445  BP: 95/72 95/69 100/70 98/64  Pulse: 72 72 73 80  Resp: 14 12 13 14   Temp:      TempSrc:      SpO2: 99% 99% 99% 99%    Constitutional: NAD, calm, comfortable Vitals:   03/17/20 0345 03/17/20 0400 03/17/20 0415 03/17/20 0445  BP: 95/72 95/69 100/70 98/64  Pulse: 72 72 73 80  Resp: 14 12 13 14   Temp:      TempSrc:      SpO2: 99% 99% 99% 99%   General: WDWN, sleeping. Opens eyes to voice but does not verbally respond and goes back to sleep.  Eyes: PERRL,  conjunctivae normal.  Sclera nonicteric HENT:  Bear/AT, external ears normal.  Nares patent without epistasis. Mucous membranes are dry. Posterior pharynx clear of any exudate or lesions. Neck: Soft, normal range of motion, supple, no masses, no thyromegaly.  Trachea midline Respiratory: clear to auscultation bilaterally, no wheezing, no crackles. Normal respiratory effort. No accessory muscle use.  Cardiovascular: Regular rate and rhythm, no murmurs / rubs / gallops. Has mild lower extremity edema.  Abdomen: Soft,  nondistended, no rebound or guarding.  No masses palpated. Bowel sounds normoactive Musculoskeletal:  no clubbing / cyanosis. No joint deformity upper and lower extremities. Normal muscle tone.  Skin: Warm, dry, intact no rashes.  Has skin breakdown areas on legs and feet that are superficial.  Thickened skin.  No erythema. no induration or fluctuation Neurologic: Withdraws to painful stimuli.  Babinski downgoing bilaterally.  Patellar DTRs +1 bilaterally    Labs on Admission: I have personally reviewed following labs and imaging studies  CBC: Recent Labs  Lab 03/16/20 2321 03/17/20 0002  WBC  --  8.4  NEUTROABS  --  6.9  HGB 13.6 13.5  HCT 40.0 44.1  MCV  --  80.5  PLT  --  231     Basic Metabolic Panel: Recent Labs  Lab 03/16/20 2321 03/17/20 0002  NA 147* 148*  K 3.1* 3.8  CL  --  103  CO2  --  26  GLUCOSE  --  154*  BUN  --  80*  CREATININE  --  3.46*  CALCIUM  --  9.9    GFR: CrCl cannot be calculated (Unknown ideal weight.).  Liver Function Tests: Recent Labs  Lab 03/17/20 0002  AST 63*  ALT 49*  ALKPHOS 51  BILITOT 0.9  PROT 7.4  ALBUMIN 3.3*    Urine analysis:    Component Value Date/Time   COLORURINE AMBER (A) 03/17/2020 0345   APPEARANCEUR TURBID (A) 03/17/2020 0345   LABSPEC 1.023 03/17/2020 0345   PHURINE 5.0 03/17/2020 0345   GLUCOSEU NEGATIVE 03/17/2020 0345   GLUCOSEU NEGATIVE 02/02/2020 1220   HGBUR SMALL (A) 03/17/2020 0345   BILIRUBINUR MODERATE (A) 03/17/2020 0345   BILIRUBINUR Neg 10/22/2017 0950   KETONESUR NEGATIVE 03/17/2020 0345   PROTEINUR 30 (A) 03/17/2020 0345   UROBILINOGEN 0.2 02/02/2020 1220   NITRITE NEGATIVE 03/17/2020 0345   LEUKOCYTESUR LARGE (A) 03/17/2020 0345    Radiological Exams on Admission: CT Head Wo Contrast  Result Date: 03/17/2020 CLINICAL DATA:  Mental status change. EXAM: CT HEAD WITHOUT CONTRAST TECHNIQUE: Contiguous axial images were obtained from the base of the skull through the vertex without intravenous contrast. COMPARISON:  October 16, 2017. FINDINGS: Brain: No evidence of acute infarction, hemorrhage, hydrocephalus, extra-axial collection or mass lesion/mass effect. Age related atrophy and chronic microvascular ischemic changes are noted. Vascular: No hyperdense vessel or unexpected calcification. Skull: Normal. Negative for fracture or focal lesion. Sinuses/Orbits: There is mild mucosal thickening of the bilateral maxillary sinuses. The remaining paranasal sinuses and mastoid air cells are essentially clear. Other: None. IMPRESSION: 1. No acute intracranial abnormality. 2. Age related atrophy and chronic microvascular ischemic changes are noted. Electronically Signed   By:  Constance Holster M.D.   On: 03/17/2020 03:10   DG Chest Port 1 View  Result Date: 03/16/2020 CLINICAL DATA:  Altered mental status, unresponsive EXAM: PORTABLE CHEST 1 VIEW COMPARISON:  Chest x-ray FINDINGS: The heart size and mediastinal contours are within normal limits. No focal consolidation. No pulmonary edema. No pleural effusion. No pneumothorax.  No acute osseous abnormality. IMPRESSION: No active disease. Electronically Signed   By: Iven Finn M.D.   On: 03/16/2020 23:59   Assessment/Plan Principal Problem:   UTI (urinary tract infection) Ms. Akkerman is admitted to med/surg floor. She is started on for antibiotic coverage.  Cultures were obtained emergency room will be monitored.  Antibiotics were adjusted if indicated by culture results. IV fluid hydration with LR at 100 ml/hr Recheck CBC, electrolytes and renal function in morning. CPK was mildly elevated but not 5 times greater than normal. Recheck CPK this afternoon  Active Problems:   Sepsis (HCC) Sepsis criteria with UTI associated with delirium, hypothermia and acute kidney injury with soft blood pressure and lactic acid level 2.1.  We will recheck lactic acid in few hours after IV fluid hydration    Acute kidney injury superimposed on chronic kidney disease (HCC) IV fluid hydration with LR Recheck electrolytes renal function morning    DM type 2 (diabetes mellitus, type 2) (HCC) Blood sugars will be monitored before every meal nightly.  Signs nonsupervised need for glycemic control.  Patient had hemoglobin A1c less than 2 months ago that was 6.8 so will not be repeated at this time    Hypothermia Patient is on bear hugger.  Initial temperature was 92 degrees.  Will monitor closely.    Delirium Confusion and delirium secondary to UTI and sepsis.  We will continue to monitor for improvement with treatment    HTN (hypertension) Home blood pressure medications will be continued once verified by pharmacy    DVT  prophylaxis: Padua score elevated. Heparin for DVT prophylaxis Code Status:   Full code Family Communication:  No family at bedside. Disposition Plan:   Patient is from:  Home  Anticipated DC to:  Home versus SNF for rehab which will be determined in next few days  Anticipated DC date:  Anticipate more than 2 midnights in hospital to treat acute medical      condition  Anticipated DC barriers: Discharge barrier will be placement in SNF if needed  Admission status:  Inpatient   Yevonne Aline Katrine Radich MD [facility]  How to contact the [facility] Attending or Consulting provider [place] or covering provider during after hours 7P -7A, for this patient?   1. Check the care team in [facility] and look for a) attending/consulting TR[facility] provider listed and b) the [facility] team listed 2. Log into [email or web address] and use [place]'s universal password to access. If you do not have the password, please contact the hospital operator. 3. Locate the [facility] provider you are looking for under [facility] and page to a number that you can be directly reached. 4. If you still have difficulty reaching the provider, please page the [facility] (Director on Call) for the Hospitalists listed on amion for assistance.  03/17/2020, 5:09 AM

## 2020-03-17 NOTE — ED Notes (Addendum)
Per Ronalee Belts, RN, pt was brought in BIB [facility] from home for AMS.  Pt supposedly was lying on the floor with 3-5 days. Pt currently covered in feces and saturated with urine.  Pt is A&Ox4. GCs is 15 but pt is extremely lethargic and slow to respond to questions.

## 2020-03-17 NOTE — ED Notes (Signed)
This RN completely undressed pt and removed old dressings on lower extremities. Pt bathed and rectal Temperature was obtained. Pt currently clean and dry at this time.

## 2020-03-17 NOTE — TOC Initial Note (Signed)
Transition of Care [facility]) - Initial/Assessment Note    Patient Details  Name: [name] [name] MRN: [id number] Date of Birth: 01-16-1946  [facility] CM/SW Contact:    Verdell Carmine, RN Phone Number: 03/17/2020, 12:02 PM  Clinical Narrative:                 Pateint from home by self has two daughters. Daughter had not heard fromher in 2 days, could not get her on the phone, she called police for a well-check. Patient found on floor in feces, with bugs crawling on her in AKI, with confusion . Being admitted for treatment with progression to SNF. Patient cannot take care of her self at home.  PT consult in system for recommendations. CSW and CM to follow for needs.   Expected Discharge Plan: [place] Barriers to Discharge: Continued Medical Work up   Patient Goals and CMS Choice        Expected Discharge Plan and Services Expected Discharge Plan: [place] In-house Referral: Clinical Social Work Discharge Planning Services: CM Consult   Living arrangements for the past 2 months: [place]                                      Prior Living Arrangements/Services Living arrangements for the past 2 months: Single Family Home Lives with:: Self Patient language and need for interpreter reviewed:: Yes        Need for Family Participation in Patient Care: Yes (Comment) Care giver support system in place?: Yes (comment)   Criminal Activity/Legal Involvement Pertinent to Current Situation/Hospitalization: No - Comment as needed  Activities of Daily Living      Permission Sought/Granted                  Emotional Assessment   Attitude/Demeanor/Rapport: Unable to Assess Affect (typically observed): Unable to Assess Orientation: : Fluctuating Orientation (Suspected and/or reported Sundowners) Alcohol / Substance Use: Not Applicable Psych Involvement: No (comment)  Admission diagnosis:  UTI (urinary tract  infection) [N39.0] Patient Active Problem List   Diagnosis Date Noted  . UTI (urinary tract infection) 03/17/2020  . Sepsis ([place]) 03/17/2020  . Hypothermia 03/17/2020  . Acute kidney injury superimposed on chronic kidney disease ([place]) 03/17/2020  . Delirium 03/17/2020  . Intractable vomiting 02/15/2020  . Klebsiella cystitis 02/15/2020  . Bilateral leg ulcer, limited to breakdown of skin ([place]) 01/16/2020  . Decreased urination 01/16/2020  . Dehydration 01/16/2020  . Allergic rhinitis 07/28/2019  . Postnasal drip 11/16/2017  . Sensorineural hearing loss (SNHL), bilateral 11/16/2017  . Tinnitus of both ears 11/16/2017  . Obesity, Class II, BMI 35-39.9, isolated (see actual BMI) 11/08/2017  . CKD (chronic kidney disease), stage III ([place]) 11/08/2017  . Anemia 11/07/2017  . Thrombocytosis 11/07/2017  . Lymphedema 11/07/2017  . AKI (acute kidney injury) ([place]) 10/16/2017  . Routine general medical examination at a health care facility 05/10/2014  . Paroxysmal SVT (supraventricular tachycardia) ([place]) 11/28/2013  . Physical deconditioning 11/23/2013  . Generalized weakness 11/17/2013  . Nausea 11/17/2013  . Multilevel spine pain 11/17/2013  . Aortic stenosis, mild 11/17/2013  . DM type 2 (diabetes mellitus, type 2) ([place]) 02/02/2013  . Arthritis 10/19/2012  . Mixed incontinence 10/19/2012  . GERD (gastroesophageal reflux disease) 04/20/2011  . HTN (hypertension) 01/20/2011  . Morbid obesity ([place])    PCP:  Hoyt Koch, MD Pharmacy:  [id number] [address], [place] [address] Phone: [phone] Fax: [phone]     Social Determinants of Health (SDOH) Interventions    Readmission Risk Interventions No flowsheet data found.

## 2020-03-17 NOTE — Progress Notes (Signed)
Completing 2nd RN skin assessment with Judson Roch, RN. Pt has right inner buttock fold fluid filled blister which broke while we were doing her assessment. Quick measurements were taken and there is an area on the right inner fold 4 x 3 cm, most probable moisture associated skin damage since this pt was found down in her home. On the left inner fold, there is an area which measures 5 x 4 cm which is unstageable due to tan colored slough covering 75% of the area. We cleansed the area and placed petroleum gauze (out of xeroform gauze at present, will order) and foam dressings over the area.  Will order mattress replacement (done). Pt also has bilateral lower extremity venous stasis ulcers which we will cover with xeroform gauze and wrap in kerlix once the supplies come up.  WOC consult to follow up tomorrow. Purwick placed for moisture containment.

## 2020-03-17 NOTE — Progress Notes (Signed)
PROGRESS NOTE    [name] [name]  WCH:[id number] DOB: [birth year] DOA: 03/16/2020 PCP: Hoyt Koch, MD  Brief Narrative: 74 year old female with history of type 2 diabetes mellitus, chronic kidney disease stage IIIa, CHF, chronic lymphedema, hypertension, osteoarthritis was brought to the ED by EMS. -Daughter reported that she had not been able to get in touch with her mom for a couple of days so she called the police to do a safety check on her and found her laying on the floor covered with stool with bugs crawling all over her. -In addition on chart review it appears that she was recently seen by her PCP for intractable nausea and vomiting for 4 to 6 weeks associated with profound weakness, was also being treated for UTI, also seen in the emergency room on 1 such occasion, had a CT abdomen pelvis 11/19 -Patient unable to provide any history at this time -Labs were notable for hypernatremia, creatinine of 3.4, white count was normal, CK was mildly elevated at 880, lactic acid was 2.1, urinalysis was abnormal, CT head  noted age-related atrophy   Assessment & Plan:   Metabolic encephalopathy -Etiology unclear at this time -Acute renal failure, dehydration contributing -Urinalysis is abnormal however has been treated for UTIs in the recent past, continue ceftriaxone follow-up urine culture -Also check ammonia, TSH and a.m. cortisol  Chronic nausea and vomiting -Etiology of this remains unclear -Recent CT abdomen pelvis on 11/19 was unremarkable -No culprit medications noted -Also check TSH and a.m. cortisol -May need gastroenterology evaluation as inpatient depending on clinical course  Acute kidney injury on CKD stage IIIa -Secondary to dehydration with ongoing ARB and Lasix use -Hold diuretic, ARB -Continue IV fluids -Monitor urine output, BMP in a.m.  Left hip pain w/ movement -check Xray  Type 2 diabetes mellitus -Recent hemoglobin A1c is 6.8 -Metformin on  hold -Continue sliding scale insulin  Hypertension -Stable, holding losartan  Chronic lymphedema  Sacral decubitus ulcers -Wound nurse consult  DVT prophylaxis: Heparin subcutaneous Code Status: Full code Family Communication: No family at bedside, will update daughter Disposition Plan:  Status is: Inpatient  Remains inpatient appropriate because:Inpatient level of care appropriate due to severity of illness   Dispo: The patient is from: Home              Anticipated d/c is to: SNF              Anticipated d/c date is: > 3 days              Patient currently is not medically stable to d/c.  Consultants:    Procedures:   Antimicrobials:    Subjective: -Uncomfortable appearing, morning,  Objective: Vitals:   03/17/20 0945 03/17/20 1000 03/17/20 1015 03/17/20 1030  BP: 103/68 (!) 106/56 105/70 110/73  Pulse: (!) 112     Resp: (!) 27 (!) 31 (!) 28 (!) 22  Temp:      TempSrc:      SpO2: 100%       Intake/Output Summary (Last 24 hours) at 03/17/2020 1050 Last data filed at 03/17/2020 0356 Gross per 24 hour  Intake 950 ml  Output --  Net 950 ml   There were no vitals filed for this visit.  Examination:  General exam: Awake, moaning, does not answer questions, appears uncomfortable Respiratory system: Decreased breath sounds the bases Cardiovascular system: S1 & S2 heard, RRR. Gastrointestinal system: Abdomen is nondistended, soft and nontender.Normal bowel sounds heard. Central nervous system: Awake, unable to  assess orientation, does not follow any commands, Extremities: Lymphedema with hyperpigmentation discoloration both lower extremities Skin: Sacral decubitus ulcers Psychiatry: Flat affect, unable to assess   Data Reviewed:   CBC: Recent Labs  Lab 03/16/20 2321 03/17/20 0002 03/17/20 0520  WBC  --  8.4 8.2  NEUTROABS  --  6.9  --   HGB 13.6 13.5 11.5*  HCT 40.0 44.1 37.3  MCV  --  80.5 80.2  PLT  --  231 144   Basic Metabolic  Panel: Recent Labs  Lab 03/16/20 2321 03/17/20 0002 03/17/20 0520  NA 147* 148*  --   K 3.1* 3.8  --   CL  --  103  --   CO2  --  26  --   GLUCOSE  --  154*  --   BUN  --  80*  --   CREATININE  --  3.46* 3.08*  CALCIUM  --  9.9  --    GFR: CrCl cannot be calculated (Unknown ideal weight.). Liver Function Tests: Recent Labs  Lab 03/17/20 0002  AST 63*  ALT 49*  ALKPHOS 51  BILITOT 0.9  PROT 7.4  ALBUMIN 3.3*   No results for input(s): LIPASE, AMYLASE in the last 168 hours. Recent Labs  Lab 03/17/20 0002  AMMONIA 23   Coagulation Profile: No results for input(s): INR, PROTIME in the last 168 hours. Cardiac Enzymes: Recent Labs  Lab 03/17/20 0002  CKTOTAL 881*   BNP (last 3 results) No results for input(s): PROBNP in the last 8760 hours. HbA1C: No results for input(s): HGBA1C in the last 72 hours. CBG: Recent Labs  Lab 03/17/20 0133 03/17/20 0908  GLUCAP 135* 105*   Lipid Profile: No results for input(s): CHOL, HDL, LDLCALC, TRIG, CHOLHDL, LDLDIRECT in the last 72 hours. Thyroid Function Tests: No results for input(s): TSH, T4TOTAL, FREET4, T3FREE, THYROIDAB in the last 72 hours. Anemia Panel: No results for input(s): VITAMINB12, FOLATE, FERRITIN, TIBC, IRON, RETICCTPCT in the last 72 hours. Urine analysis:    Component Value Date/Time   COLORURINE AMBER (A) 03/17/2020 0345   APPEARANCEUR TURBID (A) 03/17/2020 0345   LABSPEC 1.023 03/17/2020 0345   PHURINE 5.0 03/17/2020 0345   GLUCOSEU NEGATIVE 03/17/2020 0345   GLUCOSEU NEGATIVE 02/02/2020 1220   HGBUR SMALL (A) 03/17/2020 0345   BILIRUBINUR MODERATE (A) 03/17/2020 0345   BILIRUBINUR Neg 10/22/2017 0950   KETONESUR NEGATIVE 03/17/2020 0345   PROTEINUR 30 (A) 03/17/2020 0345   UROBILINOGEN 0.2 02/02/2020 1220   NITRITE NEGATIVE 03/17/2020 0345   LEUKOCYTESUR LARGE (A) 03/17/2020 0345   Sepsis Labs: @LABRCNTIP (procalcitonin:4,lacticidven:4)  ) Recent Results (from the past 240 hour(s))   Resp Panel by RT-PCR (Flu A&B, Covid) Nasopharyngeal Swab     Status: None   Collection Time: 03/16/20 11:13 PM   Specimen: Nasopharyngeal Swab; Nasopharyngeal(NP) swabs in vial transport medium  Result Value Ref Range Status   SARS Coronavirus 2 by RT PCR NEGATIVE NEGATIVE Final    Comment: (NOTE) SARS-CoV-2 target nucleic acids are NOT DETECTED.  The SARS-CoV-2 RNA is generally detectable in upper respiratory specimens during the acute phase of infection. The lowest concentration of SARS-CoV-2 viral copies this assay can detect is 138 copies/mL. A negative result does not preclude SARS-Cov-2 infection and should not be used as the sole basis for treatment or other patient management decisions. A negative result may occur with  improper specimen collection/handling, submission of specimen other than nasopharyngeal swab, presence of viral mutation(s) within the areas targeted by this  assay, and inadequate number of viral copies(<138 copies/mL). A negative result must be combined with clinical observations, patient history, and epidemiological information. The expected result is Negative.  Fact Sheet for Patients:  EntrepreneurPulse.com.au  Fact Sheet for Healthcare Providers:  IncredibleEmployment.be  This test is no t yet approved or cleared by the [place] FDA and  has been authorized for detection and/or diagnosis of SARS-CoV-2 by FDA under an Emergency Use Authorization (EUA). This EUA will remain  in effect (meaning this test can be used) for the duration of the COVID-19 declaration under Section 564(b)(1) of the Act, 21 U.S.C.section 360bbb-3(b)(1), unless the authorization is terminated  or revoked sooner.       Influenza A by PCR NEGATIVE NEGATIVE Final   Influenza B by PCR NEGATIVE NEGATIVE Final    Comment: (NOTE) The Xpert Xpress SARS-CoV-2/FLU/RSV plus assay is intended as an aid in the diagnosis of influenza from  Nasopharyngeal swab specimens and should not be used as a sole basis for treatment. Nasal washings and aspirates are unacceptable for Xpert Xpress SARS-CoV-2/FLU/RSV testing.  Fact Sheet for Patients: EntrepreneurPulse.com.au  Fact Sheet for Healthcare Providers: IncredibleEmployment.be  This test is not yet approved or cleared by the [place] FDA and has been authorized for detection and/or diagnosis of SARS-CoV-2 by FDA under an Emergency Use Authorization (EUA). This EUA will remain in effect (meaning this test can be used) for the duration of the COVID-19 declaration under Section 564(b)(1) of the Act, 21 U.S.C. section 360bbb-3(b)(1), unless the authorization is terminated or revoked.  Performed at [facility], [place] [address] [place], [place] [address]          Radiology Studies: CT Head Wo Contrast  Result Date: 03/17/2020 CLINICAL DATA:  Mental status change. EXAM: CT HEAD WITHOUT CONTRAST TECHNIQUE: Contiguous axial images were obtained from the base of the skull through the vertex without intravenous contrast. COMPARISON:  October 16, 2017. FINDINGS: Brain: No evidence of acute infarction, hemorrhage, hydrocephalus, extra-axial collection or mass lesion/mass effect. Age related atrophy and chronic microvascular ischemic changes are noted. Vascular: No hyperdense vessel or unexpected calcification. Skull: Normal. Negative for fracture or focal lesion. Sinuses/Orbits: There is mild mucosal thickening of the bilateral maxillary sinuses. The remaining paranasal sinuses and mastoid air cells are essentially clear. Other: None. IMPRESSION: 1. No acute intracranial abnormality. 2. Age related atrophy and chronic microvascular ischemic changes are noted. Electronically Signed   By: Constance Holster M.D.   On: 03/17/2020 03:10   DG Chest Port 1 View  Result Date: 03/16/2020 CLINICAL DATA:  Altered mental status, unresponsive EXAM:  PORTABLE CHEST 1 VIEW COMPARISON:  Chest x-ray FINDINGS: The heart size and mediastinal contours are within normal limits. No focal consolidation. No pulmonary edema. No pleural effusion. No pneumothorax. No acute osseous abnormality. IMPRESSION: No active disease. Electronically Signed   By: Iven Finn M.D.   On: 03/16/2020 23:59        Scheduled Meds: . heparin  5,000 Units Subcutaneous Q8H  . insulin aspart  0-5 Units Subcutaneous QHS  . insulin aspart  0-9 Units Subcutaneous TID WC   Continuous Infusions: . [START ON 03/18/2020] cefTRIAXone (ROCEPHIN)  IV    . lactated ringers 100 mL/hr at 03/17/20 0621     LOS: 0 days    Time spent: 40min    Domenic Polite, MD [facility]  03/17/2020, 10:50 AM

## 2020-03-17 NOTE — ED Notes (Signed)
Please call daughter Quentin Mulling @ [phone] for a status update

## 2020-03-17 NOTE — ED Notes (Signed)
Ordered Breakfast 

## 2020-03-18 ENCOUNTER — Encounter (HOSPITAL_COMMUNITY): Payer: Self-pay | Admitting: Family Medicine

## 2020-03-18 ENCOUNTER — Inpatient Hospital Stay (HOSPITAL_COMMUNITY): Payer: PPO

## 2020-03-18 DIAGNOSIS — E119 Type 2 diabetes mellitus without complications: Secondary | ICD-10-CM

## 2020-03-18 LAB — COMPREHENSIVE METABOLIC PANEL
ALT: 33 U/L (ref 0–44)
AST: 40 U/L (ref 15–41)
Albumin: 2.5 g/dL — ABNORMAL LOW (ref 3.5–5.0)
Alkaline Phosphatase: 46 U/L (ref 38–126)
Anion gap: 17 — ABNORMAL HIGH (ref 5–15)
BUN: 69 mg/dL — ABNORMAL HIGH (ref 8–23)
CO2: 23 mmol/L (ref 22–32)
Calcium: 8.9 mg/dL (ref 8.9–10.3)
Chloride: 111 mmol/L (ref 98–111)
Creatinine, Ser: 2.35 mg/dL — ABNORMAL HIGH (ref 0.44–1.00)
GFR, Estimated: 21 mL/min — ABNORMAL LOW (ref >60)
Glucose, Bld: 100 mg/dL — ABNORMAL HIGH (ref 70–99)
Potassium: 3.5 mmol/L (ref 3.5–5.1)
Sodium: 151 mmol/L — ABNORMAL HIGH (ref 135–145)
Total Bilirubin: 0.9 mg/dL (ref 0.3–1.2)
Total Protein: 6.2 g/dL — ABNORMAL LOW (ref 6.5–8.1)

## 2020-03-18 LAB — GLUCOSE, CAPILLARY
Glucose-Capillary: 99 mg/dL (ref 70–99)
Glucose-Capillary: 89 mg/dL (ref 70–99)
Glucose-Capillary: 96 mg/dL (ref 70–99)
Glucose-Capillary: 93 mg/dL (ref 70–99)

## 2020-03-18 LAB — CBC
HCT: 34 % — ABNORMAL LOW (ref 36.0–46.0)
Hemoglobin: 11.2 g/dL — ABNORMAL LOW (ref 12.0–15.0)
MCH: 25.8 pg — ABNORMAL LOW (ref 26.0–34.0)
MCHC: 32.9 g/dL (ref 30.0–36.0)
MCV: 78.3 fL — ABNORMAL LOW (ref 80.0–100.0)
Platelets: 189 10*3/uL (ref 150–400)
RBC: 4.34 MIL/uL (ref 3.87–5.11)
RDW: 19.9 % — ABNORMAL HIGH (ref 11.5–15.5)
WBC: 6.6 10*3/uL (ref 4.0–10.5)
nRBC: 2.4 % — ABNORMAL HIGH (ref 0.0–0.2)

## 2020-03-18 IMAGING — MR MR HEAD W/O CM
10 of 11 series · 43 of 48 positions shown · non-contrast
Comparison: Prior head CT examinations [DATE] and earlier.

CLINICAL DATA: Mental status change, unknown cause.

EXAM:
MRI HEAD WITHOUT CONTRAST
TECHNIQUE: Multiplanar, multiecho pulse sequences of the brain and surrounding
structures were obtained without intravenous contrast.

[Series 5: DWI · axial · 3.0mm · 0.88mm/px · z∈[-60,+91]mm · 9 of 104 slices shown (1 of 4)]
[im 1/104]
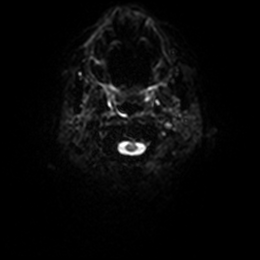
[im 13/104]
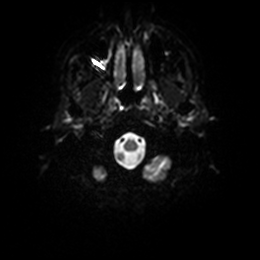
[im 26/104]
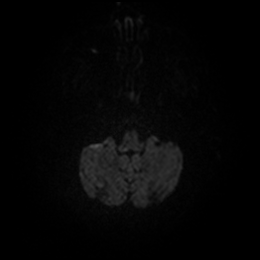
[im 39/104]
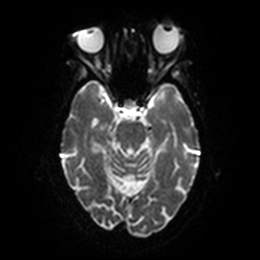
[im 52/104]
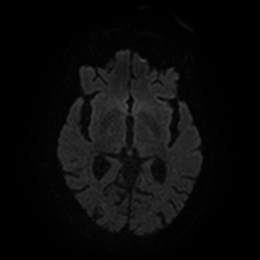
[im 65/104]
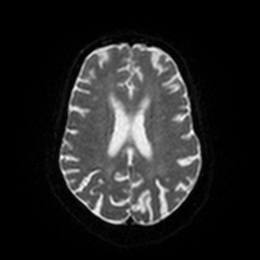
[im 78/104]
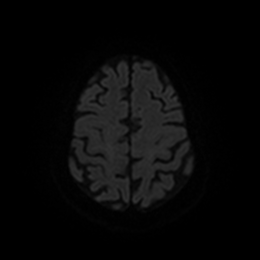
[im 91/104]
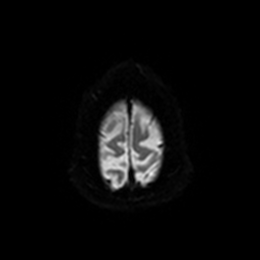
[im 104/104]
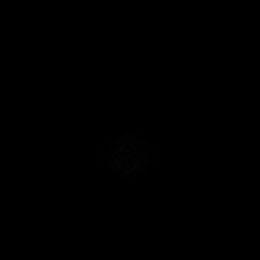

[Series 6: DWI · axial · 3.0mm · 0.88mm/px · z∈[-60,+91]mm · 5 of 52 slices shown (2 of 4)]
[im 1/52]
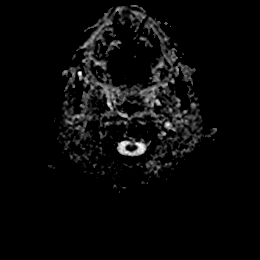
[im 13/52]
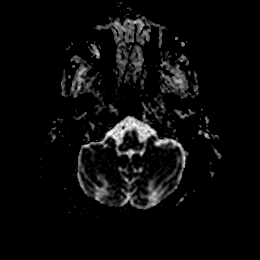
[im 26/52]
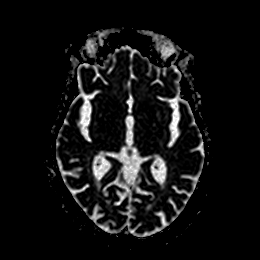
[im 39/52]
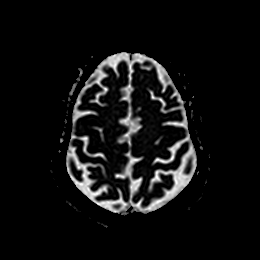
[im 52/52]
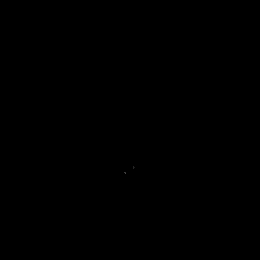

[Series 7: DWI · coronal · 4.0mm · 0.88mm/px · 7 of 76 slices shown (3 of 4)]
[im 1/76]
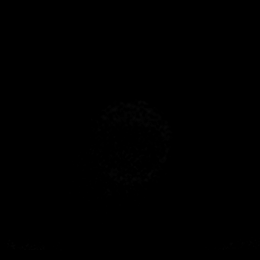
[im 13/76]
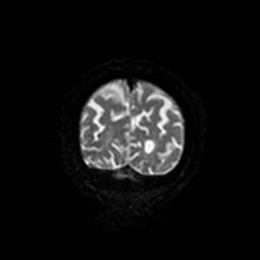
[im 26/76]
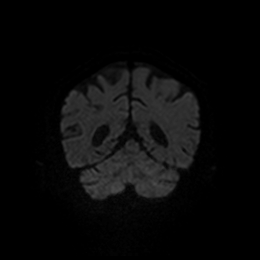
[im 38/76]
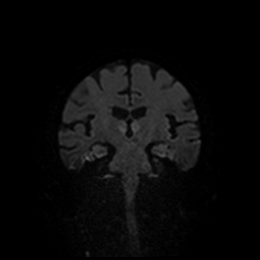
[im 51/76]
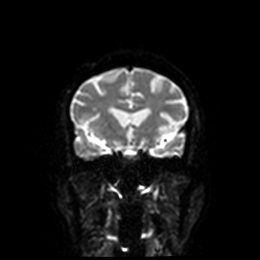
[im 63/76]
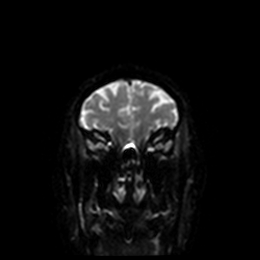
[im 76/76]
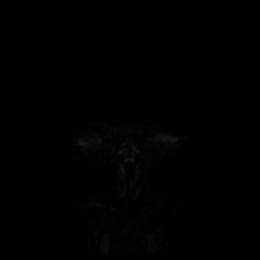

[Series 8: DWI · coronal · 4.0mm · 0.88mm/px · 3 of 36 slices shown (4 of 4)]
[im 1/36]
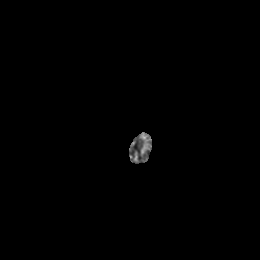
[im 18/36]
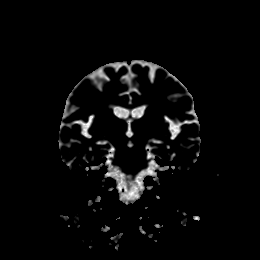
[im 36/36]
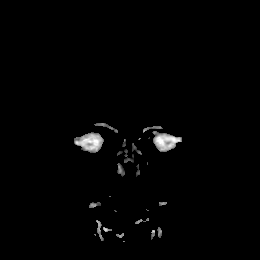

[Series 9: FLAIR · axial · 5.0mm · 0.45mm/px · z∈[-55,+87]mm · 2 of 25 slices shown]
[im 1/25]
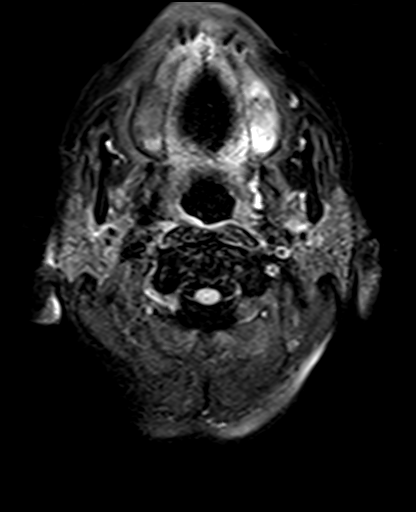
[im 25/25]
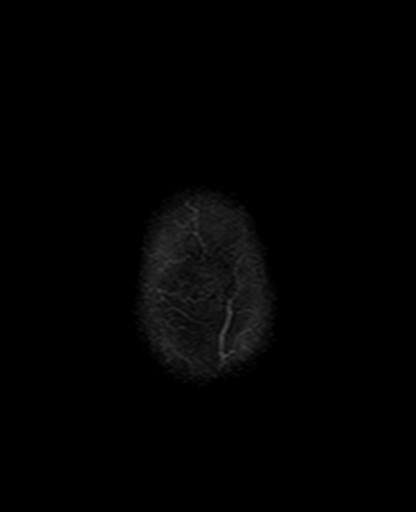

[Series 11: pha_images · axial · 3.0mm · 0.90mm/px · z∈[-57,+91]mm · 5 of 51 slices shown]
[im 1/51]
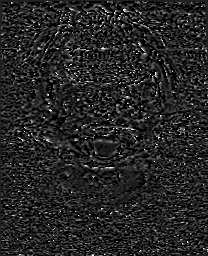
[im 13/51]
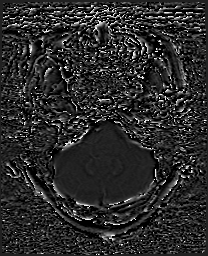
[im 26/51]
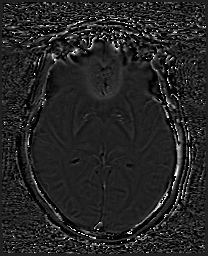
[im 38/51]
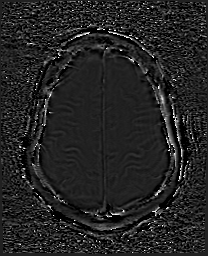
[im 51/51]
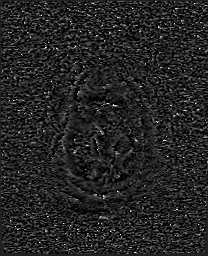

[Series 12: swi_images · axial · 3.0mm · 0.90mm/px · z∈[-60,+91]mm · 5 of 52 slices shown]
[im 1/52]
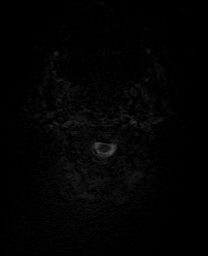
[im 13/52]
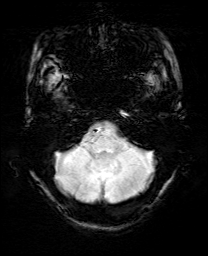
[im 26/52]
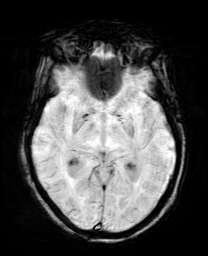
[im 39/52]
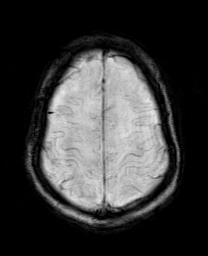
[im 52/52]
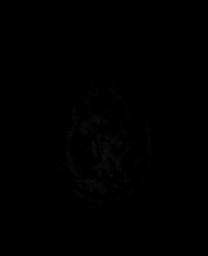

[Series 14: T2 · axial · 5.0mm · 0.72mm/px · z∈[-56,+87]mm · 2 of 25 slices shown (1 of 2)]
[im 1/25]
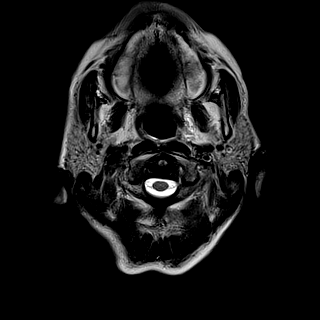
[im 25/25]
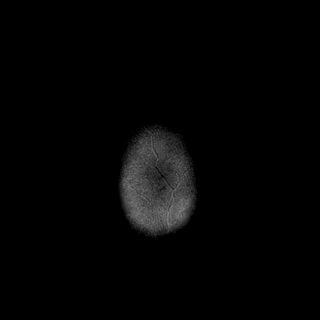

[Series 15: T1 · sagittal · 5.0mm · 0.75mm/px · 2 of 25 slices shown]
[im 1/25]
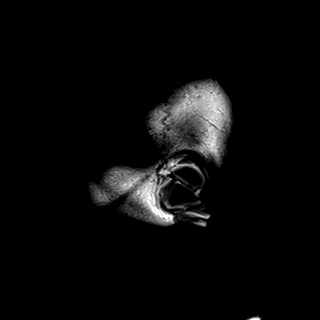
[im 25/25]
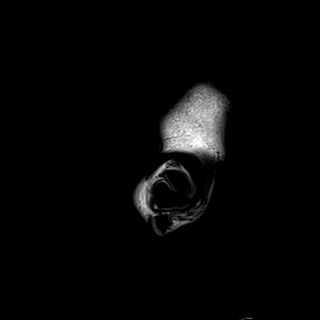

[Series 17: T2 · coronal · 5.0mm · 0.34mm/px · 3 of 30 slices shown (2 of 2)]
[im 1/30]
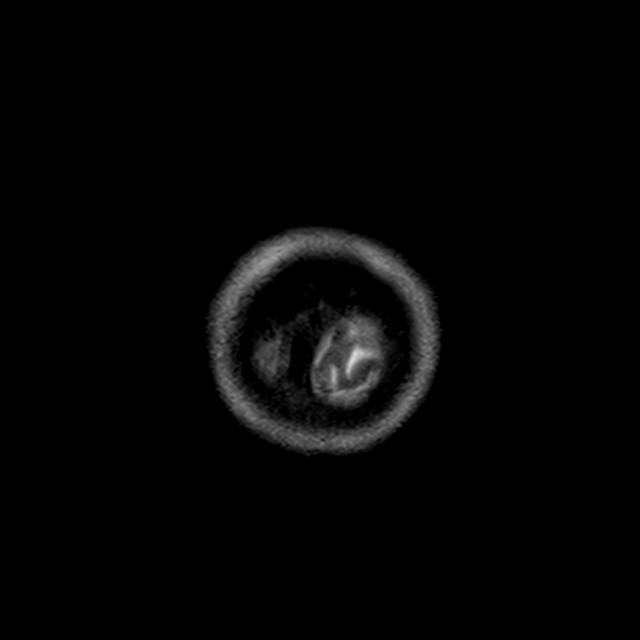
[im 15/30]
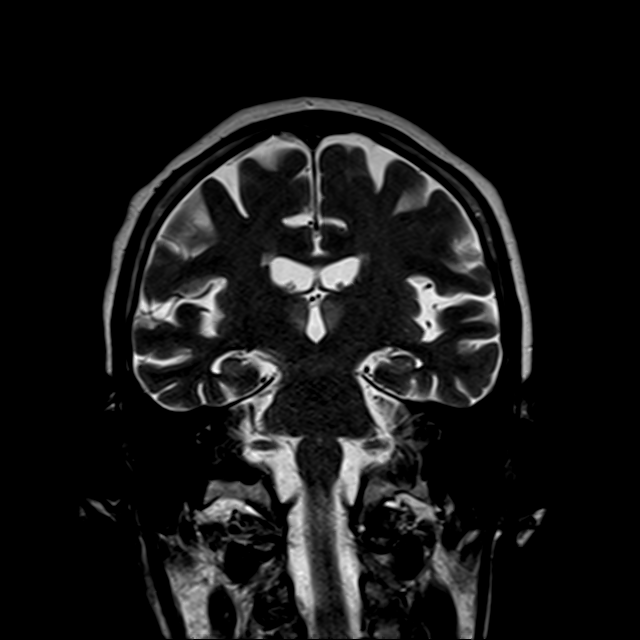
[im 30/30]
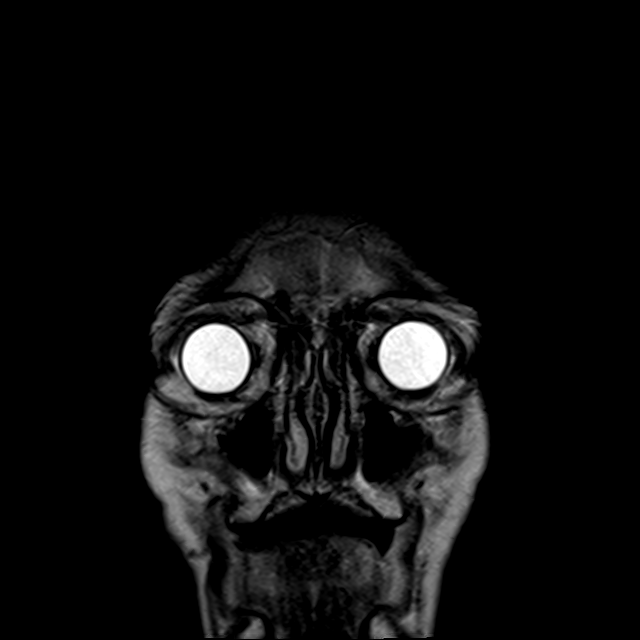

[43 of 48 positions shown; findings below may reference images not displayed]

FINDINGS: Brain:

Mild cerebral and cerebellar atrophy.

Diffusion-weighted and T2/FLAIR hyperintense signal abnormality
within the medial thalami, periaqueductal gray matter and dorsal
medulla (for instance as seen on series 5, images 79-81 and image
74) series 9, images 14 and 11) (series 9, image 6).

Mild ill-defined and scattered T2/FLAIR hyperintensity within the
cerebral white matter is nonspecific, but compatible with chronic
small vessel ischemic disease.

No evidence of intracranial mass.

No chronic intracranial blood products.

No extra-axial fluid collection.

No midline shift.

Vascular: Expected proximal arterial flow voids.

Skull and upper cervical spine: No focal marrow lesion. Cervical
spondylosis. 2 mm C2-C3 grade 1 anterolisthesis. 5 mm C3-C4 grade 1
retrolisthesis which contributes to suspected at least moderate
spinal canal stenosis and contacts the ventral spinal cord. A C4-C5
posterior disc osteophyte complex also contributes to suspected at
least moderate spinal canal stenosis.

Sinuses/Orbits: Visualized orbits show no acute finding.
Moderate-sized right maxillary sinus mucous retention cyst. Trace
ethmoid and maxillary sinus mucosal thickening.

Impression #2 will be called to the ordering clinician or
representative by the Radiologist Assistant, and communication
documented in the PACS or [REDACTED].
IMPRESSION: Diffusion-weighted and T2/FLAIR hyperintense signal abnormality
within the medial thalami, periaqueductal gray matter and dorsal
medulla. These findings are highly suggestive of Wernicke
encephalopathy.

Mild generalized parenchymal atrophy and chronic small vessel
ischemic disease.

Cervical spondylosis. At C3-C4, grade 1 retrolisthesis contributes
to suspected at least moderate spinal canal stenosis and contacts
the ventral spinal cord. At least moderate spinal canal stenosis is
also suspected at C4-C5.

Moderate-sized right maxillary sinus mucous retention cyst.

## 2020-03-18 MED ORDER — SODIUM CHLORIDE 0.45 % IV SOLN: INTRAVENOUS | Status: DC

## 2020-03-18 NOTE — Progress Notes (Addendum)
PT Cancellation Note  Patient Details Name: [name] [name] MRN: [id number] DOB: Aug 15, 1945   Cancelled Treatment:    Reason Eval/Treat Not Completed: Other (comment);Pain limiting ability to participate.  Pt had dilaudid this AM and is outside her dose range to get another.  Pt was offered meds and declined as one would be a suppository and the other requires her to swallow which she cannot.  Follow up this afternoon as pt can allow.   Ramond Dial 03/18/2020, 11:45 AM   Mee Hives, PT [facility] Acute Rehab Dept. Number: [name]

## 2020-03-18 NOTE — Progress Notes (Signed)
PROGRESS NOTE    Camiya Vinal  KVQ:[id number] DOB: 25-May-1945 DOA: 03/16/2020 PCP: Hoyt Koch, MD  Brief Narrative: 74 year old female with history of type 2 diabetes mellitus, chronic kidney disease stage IIIa, CHF, chronic lymphedema, hypertension, osteoarthritis was brought to the ED by EMS. -Daughter reported that she had not been able to get in touch with her mom for a couple of days so she called the police to do a safety check on her and found her laying on the floor covered with stool with bugs crawling all over her. -In addition on chart review it appears that she was recently seen by her PCP for intractable nausea and vomiting for 4 to 6 weeks associated with profound weakness, was also being treated for UTI, also seen in the emergency room on 1 such occasion, had a CT abdomen pelvis 11/19 -Patient unable to provide any history at this time -Labs were notable for hypernatremia, creatinine of 3.4, white count was normal, CK was mildly elevated at 880, lactic acid was 2.1, urinalysis was abnormal, CT head  noted age-related atrophy   Assessment & Plan:   Metabolic encephalopathy -Etiology unclear at this time -Acute renal failure, dehydration, hypernatremia likely contributing -Urinalysis is abnormal,  has been treated for UTIs in the recent past, continue ceftriaxone follow-up urine culture -CT head, ammonia level, TSH and a.m. cortisol are normal -Check MRI brain  Chronic nausea and vomiting -Etiology of this remains unclear -Recent CT abdomen pelvis on 11/19 was unremarkable -No culprit medications noted -TSH and a.m. cortisol are also normal -Will need gastroenterology evaluation inpatient if she has recurrence of nausea and vomiting once her mental status stabilizes  Acute kidney injury on CKD stage IIIa -Secondary to dehydration with ongoing ARB and Lasix use -Hold diuretic, ARB -Continue IV fluids, change fluids to half normal saline given  hypernatremia -Monitor urine output, BMP in a.m.  Left hip pain w/ movement -Xray notes, mod osteoarthritis -Pain control  Type 2 diabetes mellitus -Recent hemoglobin A1c is 6.8 -Metformin on hold -Continue sliding scale insulin  Hypertension -Stable, holding losartan  Chronic lymphedema  Sacral decubitus ulcers -Wound nurse consult  DVT prophylaxis: Heparin subcutaneous Code Status: Full code Family Communication: No family at bedside called and updated daughter Cathlean Cower Disposition Plan:  Status is: Inpatient  Remains inpatient appropriate because:Inpatient level of care appropriate due to severity of illness   Dispo: The patient is from: Home              Anticipated d/c is to: SNF              Anticipated d/c date is: > 3 days              Patient currently is not medically stable to d/c.  Consultants:    Procedures:   Antimicrobials:    Subjective: -Appears uncomfortable, continues to moan, refuses to speak or answer questions  Objective: Vitals:   03/17/20 1637 03/17/20 2140 03/18/20 0049 03/18/20 0441  BP:  124/85  133/81  Pulse:  92  90  Resp:  18  16  Temp:  97.7 F (36.5 C)  (!) 97.5 F (36.4 C)  TempSrc:  Oral  Axillary  SpO2:  98%  99%  Height: 5\' 10"  (1.778 m)  5\' 10"  (1.778 m)     Intake/Output Summary (Last 24 hours) at 03/18/2020 1338 Last data filed at 03/18/2020 1100 Gross per 24 hour  Intake 2389.14 ml  Output 550 ml  Net 1839.14 ml  There were no vitals filed for this visit.  Examination:  General exam: Awake moaning, does not answer questions, appears uncomfortable CVS: S1-S2, regular rate rhythm Lungs: Decreased breath sounds the bases Abdomen: Soft, obese, nontender, nondistended, bowel sounds present Extremities: Lymphedema with hyper and hypopigmentation of both lower extremities Skin: Sacral decubitus ulcers  Psychiatry: Flat affect, unable to assess   Data Reviewed:   CBC: Recent Labs  Lab 03/16/20 2321  03/17/20 0002 03/17/20 0520 03/18/20 0414  WBC  --  8.4 8.2 6.6  NEUTROABS  --  6.9  --   --   HGB 13.6 13.5 11.5* 11.2*  HCT 40.0 44.1 37.3 34.0*  MCV  --  80.5 80.2 78.3*  PLT  --  231 206 165   Basic Metabolic Panel: Recent Labs  Lab 03/16/20 2321 03/17/20 0002 03/17/20 0520 03/18/20 0414  NA 147* 148*  --  151*  K 3.1* 3.8  --  3.5  CL  --  103  --  111  CO2  --  26  --  23  GLUCOSE  --  154*  --  100*  BUN  --  80*  --  69*  CREATININE  --  3.46* 3.08* 2.35*  CALCIUM  --  9.9  --  8.9   GFR: CrCl cannot be calculated (Unknown ideal weight.). Liver Function Tests: Recent Labs  Lab 03/17/20 0002 03/18/20 0414  AST 63* 40  ALT 49* 33  ALKPHOS 51 46  BILITOT 0.9 0.9  PROT 7.4 6.2*  ALBUMIN 3.3* 2.5*   No results for input(s): LIPASE, AMYLASE in the last 168 hours. Recent Labs  Lab 03/17/20 0002  AMMONIA 23   Coagulation Profile: No results for input(s): INR, PROTIME in the last 168 hours. Cardiac Enzymes: Recent Labs  Lab 03/17/20 0002 03/17/20 1649  CKTOTAL 881* 436*   BNP (last 3 results) No results for input(s): PROBNP in the last 8760 hours. HbA1C: No results for input(s): HGBA1C in the last 72 hours. CBG: Recent Labs  Lab 03/17/20 1146 03/17/20 1745 03/17/20 2253 03/18/20 0909 03/18/20 1309  GLUCAP 99 100* 113* 99 89   Lipid Profile: No results for input(s): CHOL, HDL, LDLCALC, TRIG, CHOLHDL, LDLDIRECT in the last 72 hours. Thyroid Function Tests: Recent Labs    03/17/20 1150  TSH 1.622   Anemia Panel: No results for input(s): VITAMINB12, FOLATE, FERRITIN, TIBC, IRON, RETICCTPCT in the last 72 hours. Urine analysis:    Component Value Date/Time   COLORURINE AMBER (A) 03/17/2020 0345   APPEARANCEUR TURBID (A) 03/17/2020 0345   LABSPEC 1.023 03/17/2020 0345   PHURINE 5.0 03/17/2020 0345   GLUCOSEU NEGATIVE 03/17/2020 0345   GLUCOSEU NEGATIVE 02/02/2020 1220   HGBUR SMALL (A) 03/17/2020 0345   BILIRUBINUR MODERATE (A)  03/17/2020 0345   BILIRUBINUR Neg 10/22/2017 0950   KETONESUR NEGATIVE 03/17/2020 0345   PROTEINUR 30 (A) 03/17/2020 0345   UROBILINOGEN 0.2 02/02/2020 1220   NITRITE NEGATIVE 03/17/2020 0345   LEUKOCYTESUR LARGE (A) 03/17/2020 0345   Sepsis Labs: @LABRCNTIP (procalcitonin:4,lacticidven:4)  ) Recent Results (from the past 240 hour(s))  Resp Panel by RT-PCR (Flu A&B, Covid) Nasopharyngeal Swab     Status: None   Collection Time: 03/16/20 11:13 PM   Specimen: Nasopharyngeal Swab; Nasopharyngeal(NP) swabs in vial transport medium  Result Value Ref Range Status   SARS Coronavirus 2 by RT PCR NEGATIVE NEGATIVE Final    Comment: (NOTE) SARS-CoV-2 target nucleic acids are NOT DETECTED.  The SARS-CoV-2 RNA is generally detectable in  upper respiratory specimens during the acute phase of infection. The lowest concentration of SARS-CoV-2 viral copies this assay can detect is 138 copies/mL. A negative result does not preclude SARS-Cov-2 infection and should not be used as the sole basis for treatment or other patient management decisions. A negative result may occur with  improper specimen collection/handling, submission of specimen other than nasopharyngeal swab, presence of viral mutation(s) within the areas targeted by this assay, and inadequate number of viral copies(<138 copies/mL). A negative result must be combined with clinical observations, patient history, and epidemiological information. The expected result is Negative.  Fact Sheet for Patients:  EntrepreneurPulse.com.au  Fact Sheet for Healthcare Providers:  IncredibleEmployment.be  This test is no t yet approved or cleared by the [place] FDA and  has been authorized for detection and/or diagnosis of SARS-CoV-2 by FDA under an Emergency Use Authorization (EUA). This EUA will remain  in effect (meaning this test can be used) for the duration of the COVID-19 declaration under Section  564(b)(1) of the Act, 21 U.S.C.section 360bbb-3(b)(1), unless the authorization is terminated  or revoked sooner.       Influenza A by PCR NEGATIVE NEGATIVE Final   Influenza B by PCR NEGATIVE NEGATIVE Final    Comment: (NOTE) The Xpert Xpress SARS-CoV-2/FLU/RSV plus assay is intended as an aid in the diagnosis of influenza from Nasopharyngeal swab specimens and should not be used as a sole basis for treatment. Nasal washings and aspirates are unacceptable for Xpert Xpress SARS-CoV-2/FLU/RSV testing.  Fact Sheet for Patients: EntrepreneurPulse.com.au  Fact Sheet for Healthcare Providers: IncredibleEmployment.be  This test is not yet approved or cleared by the [place] FDA and has been authorized for detection and/or diagnosis of SARS-CoV-2 by FDA under an Emergency Use Authorization (EUA). This EUA will remain in effect (meaning this test can be used) for the duration of the COVID-19 declaration under Section 564(b)(1) of the Act, 21 U.S.C. section 360bbb-3(b)(1), unless the authorization is terminated or revoked.  Performed at [facility], [place] [address], [place], [place] [address]   Blood culture (routine x 2)     Status: None (Preliminary result)   Collection Time: 03/17/20  1:04 AM   Specimen: BLOOD  Result Value Ref Range Status   Specimen Description BLOOD RIGHT ANTECUBITAL  Final   Special Requests   Final    BOTTLES DRAWN AEROBIC AND ANAEROBIC Blood Culture results may not be optimal due to an inadequate volume of blood received in culture bottles   Culture   Final    NO GROWTH 1 DAY Performed at [facility], [place] [address] [place], [place] [address]    Report Status PENDING  Incomplete  Blood culture (routine x 2)     Status: None (Preliminary result)   Collection Time: 03/17/20  2:07 AM   Specimen: BLOOD RIGHT HAND  Result Value Ref Range Status   Specimen Description BLOOD RIGHT HAND  Final   Special  Requests   Final    BOTTLES DRAWN AEROBIC AND ANAEROBIC Blood Culture results may not be optimal due to an inadequate volume of blood received in culture bottles   Culture   Final    NO GROWTH 1 DAY Performed at [facility], [place] [address] [place], [place] [address]    Report Status PENDING  Incomplete  Urine culture     Status: None (Preliminary result)   Collection Time: 03/17/20 11:50 AM   Specimen: Urine, Random  Result Value Ref  Range Status   Specimen Description URINE, RANDOM  Final   Special Requests NONE  Final   Culture   Final    CULTURE REINCUBATED FOR BETTER GROWTH Performed at [facility], [address], [place], [place] [address]    Report Status PENDING  Incomplete         Radiology Studies: CT Head Wo Contrast  Result Date: 03/17/2020 CLINICAL DATA:  Mental status change. EXAM: CT HEAD WITHOUT CONTRAST TECHNIQUE: Contiguous axial images were obtained from the base of the skull through the vertex without intravenous contrast. COMPARISON:  October 16, 2017. FINDINGS: Brain: No evidence of acute infarction, hemorrhage, hydrocephalus, extra-axial collection or mass lesion/mass effect. Age related atrophy and chronic microvascular ischemic changes are noted. Vascular: No hyperdense vessel or unexpected calcification. Skull: Normal. Negative for fracture or focal lesion. Sinuses/Orbits: There is mild mucosal thickening of the bilateral maxillary sinuses. The remaining paranasal sinuses and mastoid air cells are essentially clear. Other: None. IMPRESSION: 1. No acute intracranial abnormality. 2. Age related atrophy and chronic microvascular ischemic changes are noted. Electronically Signed   By: Constance Holster M.D.   On: 03/17/2020 03:10   DG Chest Port 1 View  Result Date: 03/16/2020 CLINICAL DATA:  Altered mental status, unresponsive EXAM: PORTABLE CHEST 1 VIEW COMPARISON:  Chest x-ray FINDINGS: The heart size and mediastinal contours are within normal  limits. No focal consolidation. No pulmonary edema. No pleural effusion. No pneumothorax. No acute osseous abnormality. IMPRESSION: No active disease. Electronically Signed   By: Iven Finn M.D.   On: 03/16/2020 23:59   DG HIP UNILAT WITH PELVIS 2-3 VIEWS LEFT  Result Date: 03/17/2020 CLINICAL DATA:  Left hip pain. EXAM: DG HIP (WITH OR WITHOUT PELVIS) 2-3V LEFT COMPARISON:  None. FINDINGS: There is no evidence of hip fracture or dislocation. Moderate osteophyte formation of left hip is noted. IMPRESSION: Moderate osteoarthritis of left hip. No acute abnormality seen. Electronically Signed   By: Marijo Conception M.D.   On: 03/17/2020 12:52   Scheduled Meds: . heparin  5,000 Units Subcutaneous Q8H  . insulin aspart  0-5 Units Subcutaneous QHS  . insulin aspart  0-9 Units Subcutaneous TID WC   Continuous Infusions: . sodium chloride 75 mL/hr at 03/18/20 1020  . cefTRIAXone (ROCEPHIN)  IV Stopped (03/18/20 0559)     LOS: 1 day    Time spent: 16min  Domenic Polite, MD [facility]  03/18/2020, 1:38 PM

## 2020-03-18 NOTE — Progress Notes (Signed)
Attempted to call daughter Quentin Mulling at [phone] for status update as requested.

## 2020-03-18 NOTE — Consult Note (Addendum)
[facility] Nurse Consult Note: Patient receiving care in [phone] Reason for Consult:Sacral wound Wound type: Partial thickness MASD/IAD just above the gluteal fold measures 4 cm x 3.5 cm moist pink red with no drainage or odor. Fissure in the intergluteal cleft approx. 7 cm x 0.2 cm pink moist Pressure Injury POA: NA Periwound: Intact Dressing procedure/placement/frequency:  Currently on a standard air mattress.  Clean entire sacral area with no rinse cleaner, pat dry. Apply Xeroform gauze Kellie Simmering # 294) folded in the gluteal cleft and cover with sacral foam. Change daily.   Monitor the wound area(s) for worsening of condition such as: Signs/symptoms of infection, increase in size, development of or worsening of odor, development of pain, or increased pain at the affected locations.   Notify the medical team if any of these develop.  Thank you for the consult. [facility] nurse will not follow at this time.   Please re-consult the [facility] team if needed.  Cathlean Marseilles Tamala Julian, MSN, RN, [place], Lysle Pearl, [facility] Wound Treatment Associate Pager [phone]

## 2020-03-18 NOTE — Progress Notes (Signed)
   03/18/20 1151  Clinical Encounter Type  Visited With Patient  Visit Type Initial  Referral From Nurse  Consult/Referral To Chaplain   Chaplain responded to consult request. Chaplain prayed with Pt. Chaplain remains available as needed.  This note was prepared by Chaplain Resident, Dante Gang, MDiv. For questions, please contact by phone at [phone].

## 2020-03-18 NOTE — Progress Notes (Signed)
Initial Nutrition Assessment  DOCUMENTATION CODES:   Severe malnutrition in context of social or environmental circumstances  INTERVENTION:   -Magic cup TID with meals, each supplement provides 290 kcal and 9 grams of protein -Consider temporary means of enteral nutrition support if poor oral intake persists; case discussed with Dr. Broadus John  NUTRITION DIAGNOSIS:   Severe Malnutrition related to social / environmental circumstances as evidenced by percent weight loss,mild fat depletion,moderate fat depletion,mild muscle depletion,moderate muscle depletion.  GOAL:   Patient will meet greater than or equal to 90% of their needs  MONITOR:   PO intake,Supplement acceptance,Labs,Weight trends,Skin,I & O's  REASON FOR ASSESSMENT:   Malnutrition Screening Tool    ASSESSMENT:   [name] [name] is a 74 y.o. female with medical history significant for DMT2, CKD, CHF, HTN, OA who presents by EMS. Her daughter had not been able to get in touch with [name] [name] for past two days and she called the police to do a safety check on her.  Found by police sitting on the floor, leaning forward in the central living space with bugs crawling on her. Patient was next to her walker sitting in her soiled clothes with excrement on the floor around her. Patient reportedly denied pain including chest pain, back pain, abdominal pain and SOB. Pt is now sleeping on gurney in ER and does not respond to questions or follow commands. ER provider reported that pt was initially confused when she arrived. Per chart review, patient seen on 03/08/20 by her PCP for intractable N/V x 4-6 weeks with profound weakness, lightheadedness.  Pt admitted with metabolic encephalopathy and chronic nausea and vomiting.   Reviewed I/O's: +2.2 L x 24 hours and +3.1 L since admission  UOP: 200 ml x 24 hours  Pt not very interactive at time of visit. She opened her eyes when RD greeted her and moaned during exam.   Per RN, pt unable to  swallow medications. Meal tray at bedside, untouched.   Reviewed wt hx; pt has experienced a 6.4% wt loss over the past month, which is significant for time frame. Unable to obtain current wt on bed at time of visit, however, suspect continued weight loss.   If poor oral intake persists, may need to consider temporary means of enteral nutrition. Case discussed with Dr. Broadus John; recommend possible cortrak tube placement if mental status does not improve.   Medications reviewed and include 0.45% sodium chloride infusion @ 75 ml/hr.   Labs reviewed: Na: 151, CBGS: 89-99 (inpatient orders for glycemic control are 0-9 units insulin aspart TID with meals and 0-5 units insulin aspart daily at bedtime).   NUTRITION - FOCUSED PHYSICAL EXAM:  Flowsheet Row Most Recent Value  Orbital Region Moderate depletion  Upper Arm Region Mild depletion  Thoracic and Lumbar Region Unable to assess  Buccal Region Moderate depletion  Temple Region Moderate depletion  Clavicle Bone Region Moderate depletion  Clavicle and Acromion Bone Region Moderate depletion  Scapular Bone Region Moderate depletion  Dorsal Hand Mild depletion  Patellar Region No depletion  Anterior Thigh Region No depletion  Posterior Calf Region No depletion  Edema (RD Assessment) Mild  Hair Reviewed  Eyes Reviewed  Mouth Reviewed  Skin Reviewed  Nails Reviewed       Diet Order:   Diet Order            Diet heart healthy/carb modified Room service appropriate? Yes; Fluid consistency: Thin  Diet effective now  EDUCATION NEEDS:   Not appropriate for education at this time  Skin:  Skin Assessment: Skin Integrity Issues: Skin Integrity Issues:: Other (Comment) Other: bilateral leg venous stasis ulcers  Last BM:  Unknown  Height:   Ht Readings from Last 1 Encounters:  03/18/20 5\' 10"  (1.778 m)    Weight:   Wt Readings from Last 1 Encounters:  02/15/20 113.4 kg    Ideal Body Weight:  72.7  kg  BMI:  Body mass index is 35.87 kg/m.  Estimated Nutritional Needs:   Kcal:  2200-2400  Protein:  130-145 grams  Fluid:  > 2 L    Loistine Chance, RD, LDN, [place] Registered Dietitian II Certified Diabetes Care and Education Specialist Please refer to [facility] for RD and/or RD on-call/weekend/after hours pager

## 2020-03-19 DIAGNOSIS — E512 Wernicke's encephalopathy: Principal | ICD-10-CM

## 2020-03-19 DIAGNOSIS — E43 Unspecified severe protein-calorie malnutrition: Secondary | ICD-10-CM | POA: Insufficient documentation

## 2020-03-19 LAB — BASIC METABOLIC PANEL
Anion gap: 17 — ABNORMAL HIGH (ref 5–15)
BUN: 58 mg/dL — ABNORMAL HIGH (ref 8–23)
CO2: 25 mmol/L (ref 22–32)
Calcium: 9 mg/dL (ref 8.9–10.3)
Chloride: 111 mmol/L (ref 98–111)
Creatinine, Ser: 1.64 mg/dL — ABNORMAL HIGH (ref 0.44–1.00)
GFR, Estimated: 33 mL/min — ABNORMAL LOW (ref >60)
Glucose, Bld: 126 mg/dL — ABNORMAL HIGH (ref 70–99)
Potassium: 3.3 mmol/L — ABNORMAL LOW (ref 3.5–5.1)
Sodium: 153 mmol/L — ABNORMAL HIGH (ref 135–145)

## 2020-03-19 LAB — URINE CULTURE: Culture: 20000 — AB

## 2020-03-19 LAB — COMPREHENSIVE METABOLIC PANEL
ALT: 30 U/L (ref 0–44)
AST: 34 U/L (ref 15–41)
Albumin: 2.4 g/dL — ABNORMAL LOW (ref 3.5–5.0)
Alkaline Phosphatase: 46 U/L (ref 38–126)
Anion gap: 14 (ref 5–15)
BUN: 62 mg/dL — ABNORMAL HIGH (ref 8–23)
CO2: 25 mmol/L (ref 22–32)
Calcium: 8.8 mg/dL — ABNORMAL LOW (ref 8.9–10.3)
Chloride: 112 mmol/L — ABNORMAL HIGH (ref 98–111)
Creatinine, Ser: 1.84 mg/dL — ABNORMAL HIGH (ref 0.44–1.00)
GFR, Estimated: 29 mL/min — ABNORMAL LOW (ref >60)
Glucose, Bld: 100 mg/dL — ABNORMAL HIGH (ref 70–99)
Potassium: 3.5 mmol/L (ref 3.5–5.1)
Sodium: 151 mmol/L — ABNORMAL HIGH (ref 135–145)
Total Bilirubin: 0.8 mg/dL (ref 0.3–1.2)
Total Protein: 5.9 g/dL — ABNORMAL LOW (ref 6.5–8.1)

## 2020-03-19 LAB — CBC
HCT: 35.8 % — ABNORMAL LOW (ref 36.0–46.0)
Hemoglobin: 10.9 g/dL — ABNORMAL LOW (ref 12.0–15.0)
MCH: 24.4 pg — ABNORMAL LOW (ref 26.0–34.0)
MCHC: 30.4 g/dL (ref 30.0–36.0)
MCV: 80.1 fL (ref 80.0–100.0)
Platelets: 165 10*3/uL (ref 150–400)
RBC: 4.47 MIL/uL (ref 3.87–5.11)
RDW: 20.4 % — ABNORMAL HIGH (ref 11.5–15.5)
WBC: 6 10*3/uL (ref 4.0–10.5)
nRBC: 2.2 % — ABNORMAL HIGH (ref 0.0–0.2)

## 2020-03-19 LAB — GLUCOSE, CAPILLARY
Glucose-Capillary: 98 mg/dL (ref 70–99)
Glucose-Capillary: 111 mg/dL — ABNORMAL HIGH (ref 70–99)
Glucose-Capillary: 137 mg/dL — ABNORMAL HIGH (ref 70–99)
Glucose-Capillary: 124 mg/dL — ABNORMAL HIGH (ref 70–99)

## 2020-03-19 LAB — MAGNESIUM: Magnesium: 1.8 mg/dL (ref 1.7–2.4)

## 2020-03-19 LAB — VITAMIN B12: Vitamin B-12: 964 pg/mL — ABNORMAL HIGH (ref 180–914)

## 2020-03-19 MED ORDER — THIAMINE HCL 100 MG/ML IJ SOLN: 500.0000 mg | Freq: Three times a day (TID) | INTRAVENOUS | Status: DC

## 2020-03-19 MED ORDER — SODIUM CHLORIDE 0.45 % IV SOLN: INTRAVENOUS | Status: DC

## 2020-03-19 MED ORDER — DEXTROSE 5 % IV SOLN: INTRAVENOUS | Status: DC

## 2020-03-19 MED ORDER — THIAMINE HCL 100 MG/ML IJ SOLN: 250.0000 mg | Freq: Every day | INTRAVENOUS | Status: DC

## 2020-03-19 MED ORDER — THIAMINE HCL 100 MG/ML IJ SOLN
500.0000 mg | Freq: Two times a day (BID) | INTRAVENOUS | Status: DC
  Administered 2020-03-19: 500 mg via INTRAVENOUS
  Filled 2020-03-19 (×2): qty 5

## 2020-03-19 MED ORDER — THIAMINE HCL 100 MG/ML IJ SOLN
250.0000 mg | Freq: Every day | INTRAVENOUS | Status: AC
  Administered 2020-03-23 – 2020-03-27 (×5): 250 mg via INTRAVENOUS
  Filled 2020-03-19 (×5): qty 2.5

## 2020-03-19 MED ORDER — FOSFOMYCIN TROMETHAMINE 3 G PO PACK
3.0000 g | PACK | Freq: Once | ORAL | Status: AC
  Administered 2020-03-19: 3 g via ORAL
  Filled 2020-03-19 (×2): qty 3

## 2020-03-19 MED ORDER — SODIUM CHLORIDE 0.9 % IV SOLN: INTRAVENOUS | Status: DC

## 2020-03-19 MED ORDER — THIAMINE HCL 100 MG/ML IJ SOLN
500.0000 mg | Freq: Three times a day (TID) | INTRAVENOUS | Status: DC
  Filled 2020-03-19 (×2): qty 5

## 2020-03-19 MED ORDER — THIAMINE HCL 100 MG/ML IJ SOLN: 500.0000 mg | INTRAVENOUS | Status: AC

## 2020-03-19 MED ORDER — ONDANSETRON HCL 4 MG/2ML IJ SOLN: 4.0000 mg | Freq: Four times a day (QID) | INTRAMUSCULAR | Status: DC | PRN

## 2020-03-19 MED ORDER — THIAMINE HCL 100 MG/ML IJ SOLN
500.0000 mg | Freq: Three times a day (TID) | INTRAVENOUS | Status: AC
  Administered 2020-03-19 – 2020-03-22 (×9): 500 mg via INTRAVENOUS
  Filled 2020-03-19 (×10): qty 5

## 2020-03-19 NOTE — Consult Note (Signed)
Neurology Consultation  Reason for Consult: new onset speech difficulties, confusion Referring Physician: Fanny Bien  CC:  Pt can not state. Per MD, new onset worsening of confusion and speech difficulties  History is obtained from: chart, daughter at bedside.   HPI: [name] [name] is a 74 y.o. female with a PMHx of DM II, CKD, chronic lymphedema, CHF, and HTN. Pt was admitted on 03/17/20 after her daughter could not reach her for 2 days and called Police to do a welfare check. Upon check, pt was found down in feces with bugs on her. She was awake and alert when found. She had seen per PCP recently for intractable n/v x 4-6 weeks associated with profound weakness and lightheadedness. She was found to have a UTI and abx were started. Her confusion was thought to be due to UTI and sepsis. Also, thought to contribute was her AKI, dehydration and hypernatremia. CT head, ammonia level, TSH and Cortisol were normal and/or without acute findings.   Today, pt acutely began to have slurred and limited speech. MRI was ordered by hospitalist. Vit B1 level was ordered and high dose IV thiamine was started.  Per daughter at bedside, pt can normally do her own ADLs and walks with a walker at home. Pt normally is oriented but has had weakness lately because of the poor po intake x 4 weeks.   Neuro asked to consult due to symptoms of Wernicke's encephalopathy.   ROS: Pt is unable to participate in complete ROS due to mental status. Receptive aphasia interferes with her participation.   Past Medical History:  Diagnosis Date  . Acute kidney injury ([place])   . Aortic stenosis, mild 11/17/2013  . Arthritis    "both knees, spine, back, fingers" (11/29/2013)  . CHF (congestive heart failure) ([place])   . Edema   . GERD (gastroesophageal reflux disease)   . Heart murmur   . History of diastolic dysfunction    Echo 06/2669 with diastolic dysfunction  . HTN (hypertension)   . Morbid obesity ([place])   . OA  (osteoarthritis)   . PAC (premature atrial contraction)    per prior Holter  . Palpitations   . Pneumonia    "as a child"  . PVC's (premature ventricular contractions)    per prior Holter  . SVT (supraventricular tachycardia) ([place])   . Tachycardia    noted at 07/28/11 visit. Started on beta blocker. Possible atrial flutter vs long PR tachycardia/AVNRT  . Type II diabetes mellitus ([facility])     Family History  Problem Relation Age of Onset  . Alzheimer's disease Mother   . Lung cancer Mother   . COPD Father   . Diabetes Maternal Uncle   . Stroke Neg Hx   . Colon cancer Neg Hx   . Stomach cancer Neg Hx   . Esophageal cancer Neg Hx     Social History:   reports that she quit smoking about 34 years ago. Her smoking use included cigarettes. She has a 30.00 pack-year smoking history. She has never used smokeless tobacco. She reports that she does not drink alcohol and does not use drugs.  Medications  Current Facility-Administered Medications:  .  0.45 % sodium chloride infusion, , Intravenous, Continuous, Kirby-Graham, Karsten Fells, NP .  acetaminophen (TYLENOL) tablet 650 mg, 650 mg, Oral, Q6H PRN **OR** acetaminophen (TYLENOL) suppository 650 mg, 650 mg, Rectal, Q6H PRN, Chotiner, Yevonne Aline, MD .  fosfomycin (MONUROL) packet 3 g, 3 g, Oral, Once, Pham, Minh Q, RPH-CPP .  heparin injection 5,000 Units, 5,000 Units, Subcutaneous, Q8H, Chotiner, Yevonne Aline, MD, 5,000 Units at 03/19/20 1328 .  HYDROmorphone (DILAUDID) injection 0.5 mg, 0.5 mg, Intravenous, Q4H PRN, Domenic Polite, MD, 0.5 mg at 03/18/20 1841 .  insulin aspart (novoLOG) injection 0-5 Units, 0-5 Units, Subcutaneous, QHS, Chotiner, Yevonne Aline, MD .  insulin aspart (novoLOG) injection 0-9 Units, 0-9 Units, Subcutaneous, TID WC, Chotiner, Yevonne Aline, MD .  ondansetron [facility]) injection 4 mg, 4 mg, Intravenous, Q6H PRN, Domenic Polite, MD .  Derrill Memo ON 03/23/2020] thiamine (B-1) 250 mg in sodium chloride 0.9 % 50 mL IVPB, 250 mg,  Intravenous, Daily, Kirby-Graham, Karsten Fells, NP .  thiamine 500mg  in normal saline (44ml) IVPB, 500 mg, Intravenous, Q8H, Kirby-Graham, Karsten Fells, NP   Exam: Current vital signs: BP 112/78   Pulse 79   Temp 97.9 F (36.6 C) (Oral)   Resp 19   Ht 5\' 10"  (1.778 m)   Wt 113.4 kg   SpO2 95%   BMI 35.87 kg/m  Vital signs in last 24 hours: Temp:  [97.9 F (36.6 C)-98.3 F (36.8 C)] 97.9 F (36.6 C) (12/14 1349) Pulse Rate:  [79-82] 79 (12/14 1349) Resp:  [18-19] 19 (12/14 0500) BP: (112-124)/(68-84) 112/78 (12/14 1349) SpO2:  [95 %-100 %] 95 % (12/14 1349) Weight:  [113.4 kg] 113.4 kg (12/14 1100)  GENERAL: Awake, alert in NAD Psychiatric: Cooperative but intermittently suspicious (states "I knew you brought me here against my will" and demands to know why she has been brought here) HEENT: - Normocephalic and atraumatic LUNGS - Normal respiratory effort. SaO2 95% CV - RRR on tele Ext: warm, lymphedema noted to BLEs.   NEURO:  Mental Status: Alert, flat. Knows her name but is disoriented to other. Seems to not want to participate in all parts of exam.   Speech/Language: speech is hesitant and at times, she does not answer at all. Rhythm is normal.  Naming intact to watch, ring, but not to phone (calls it a computer). Comprehension delayed with some questionability due to not following some commands, but may be due to receptive aphasia vs abulia. Perseverates. (On later attending examination, she is more brisk in her responses and does not have significant delays, though she continues to have mild naming difficulties) Cranial Nerves:  II: PERRL. Pt will not participate in visual field exam. She does not read letters on Snellen chart, but does read numbers with some repeating of same numbers when onto the next line of chart.  III, IV, VI: EOMI but there is direction changing nystagmus more prominent in horizontal than vertical gaze V: sensation is intact and symmetrical to face. Moves jaw  back and forth.  VII: Smile is symmetrical.  VIII: hearing intact to voice IX, X: Uvula elevates symmetrically XI: normal sternocleidomastoid and trapezius muscle strength XII: tongue is symmetrical without fasciculations.   Motor: 5/5 strength to UEs. Unable to test LEs due to pain.  Tone is normal to UEs. LEs are very edematous (chronic).  Sensation- Delay in answering questions for sensation or no answer at all.   Reflexes: 2+ bilateral biceps and brachioradialis, however patellar's are 3+ bilaterally.  Does not relax adequately for Hoffmann's testing.  No clonus Coordination: delay with instructions. No performance of commands. Gait- PT was coming in while NP there. Per note, pt had balance issues with standing and attempting to ambulate.   Labs I have reviewed labs in epic and the results pertinent to this consultation are: TSH 1,670m Cortisol 25, B1  pending. B12 964, CBG 111, Ammonia 23, Creat 3.4 down to 1.84, Na 151, CK 880, LA 2.1.   CBC    Component Value Date/Time   WBC 6.0 03/19/2020 0127   RBC 4.47 03/19/2020 0127   HGB 10.9 (L) 03/19/2020 0127   HCT 35.8 (L) 03/19/2020 0127   PLT 165 03/19/2020 0127   MCV 80.1 03/19/2020 0127   MCH 24.4 (L) 03/19/2020 0127   MCHC 30.4 03/19/2020 0127   RDW 20.4 (H) 03/19/2020 0127   LYMPHSABS 0.9 03/17/2020 0002   MONOABS 0.4 03/17/2020 0002   EOSABS 0.0 03/17/2020 0002   BASOSABS 0.1 03/17/2020 0002    CMP     Component Value Date/Time   NA 151 (H) 03/19/2020 0127   K 3.5 03/19/2020 0127   CL 112 (H) 03/19/2020 0127   CO2 25 03/19/2020 0127   GLUCOSE 100 (H) 03/19/2020 0127   BUN 62 (H) 03/19/2020 0127   CREATININE 1.84 (H) 03/19/2020 0127   CALCIUM 8.8 (L) 03/19/2020 0127   PROT 5.9 (L) 03/19/2020 0127   ALBUMIN 2.4 (L) 03/19/2020 0127   AST 34 03/19/2020 0127   ALT 30 03/19/2020 0127   ALKPHOS 46 03/19/2020 0127   BILITOT 0.8 03/19/2020 0127   GFRNONAA 29 (L) 03/19/2020 0127   GFRAA 41 (L) 01/09/2019 0456     Lipid Panel     Component Value Date/Time   CHOL 178 03/20/2019 1446   TRIG 58.0 03/20/2019 1446   HDL 74.50 03/20/2019 1446   CHOLHDL 2 03/20/2019 1446   VLDL 11.6 03/20/2019 1446   LDLCALC 92 03/20/2019 1446     Imaging I have reviewed the images obtained: CT-scan of the brain 1. No acute intracranial abnormality. 2. Age related atrophy and chronic microvascular ischemic changes are noted.  MRI examination of the brain- 1. Diffusion-weighted and T2/FLAIR hyperintense signal abnormality within the medial thalami, periaqueductal gray matter and dorsal medulla. These findings are highly suggestive of Wernicke Encephalopathy.  2. Mild generalized parenchymal atrophy and chronic small vessel ischemic disease. 3. Cervical spondylosis. At C3-C4, grade 1 retrolisthesis contributes to suspected at least moderate spinal canal stenosis and contacts the ventral spinal cord. At least moderate spinal canal stenosis is also suspected at C4-C5. 4. Moderate-sized right maxillary sinus mucous retention cyst.  Assessment: 74 yo female admitted on 03/17/20 after being down x 2 days in her home. She has been confused since arrival, but this has not improved. Today, she was found to have difficulty speaking and neuro was called to consult. Her encephalopathy has been worked up in re: infection, nutrition, and electrolyte derangement. Given her new onset of speech issues, a MRI brain was performed which showed findings consistent with Wernicke's encephalopathy.   Impression:Her toxic encephalopathy can be attributed to other factors, including: UTI, malnutrition over 4-6 weeks prior to 03/08/20, sepsis, and hypernatremia. However, the onset of speech difficulties accompanied by exam findings of nystagmus and balance issues can be explained by findings consistent with Wernicke's likely sequale of her chronic nausea/vomitting. Additionally, MRI had incidental finding of potential C-spine  compression; C-spine injury can sometimes contribute to nausea/vomitting.  In support of potential compression (though possibly secondary to encephalopathy) she does have increased reflexes. Therefore, will obtain MRI C-spine to evaluate this further  Recommendations: -Thiamine 500mg  IV q8hrs x 72 hours, followed by 250mg  IV qd x 5 days (ordered) -Please note, treatment of Wernicke's encephalopathy is thiamine repletion and supportive care.  She may or may not make a substantial recovery as  cerebral injury can be permanent -IVF changed to 1/2 NS from D5W due to holding glucose with Thiamine administration. -May resume dextrose supplementation as needed to manage hypoglycemia when standing administrations began -Thiamine level pending -Appreciate dietitian/nutrition consult for other potential vitamin deficiencies -MRI C-spine w/ and w/o contrast -PT/OT   Pt seen by Clance Boll, NP/Neuro. Plan discussed with Dr. Curly [place]. Note/plan to be edited by MD.   Pager: [phone]  Attending Neurologist's note:  I personally saw this patient, gathering history, performing a full neurologic examination, reviewing relevant labs, personally reviewing relevant imaging including MRI brain and CT head, and formulated the assessment and plan, adding the note above for completeness and clarity to accurately reflect my thoughts  Lesleigh [name] MD-PhD [facility] [phone]

## 2020-03-19 NOTE — Evaluation (Signed)
Clinical/Bedside Swallow Evaluation Patient Details  Name: Lateefa [name] MRN: [id number] Date of Birth: [birth year]  Today's Date: 03/19/2020 Time: SLP Start Time (ACUTE ONLY): 59 SLP Stop Time (ACUTE ONLY): 1050 SLP Time Calculation (min) (ACUTE ONLY): 10 min  Past Medical History:  Past Medical History:  Diagnosis Date  . Acute kidney injury ([place])   . Aortic stenosis, mild 11/17/2013  . Arthritis    "both knees, spine, back, fingers" (11/29/2013)  . CHF (congestive heart failure) ([place])   . Edema   . GERD (gastroesophageal reflux disease)   . Heart murmur   . History of diastolic dysfunction    Echo 11/7562 with diastolic dysfunction  . HTN (hypertension)   . Morbid obesity ([place])   . OA (osteoarthritis)   . PAC (premature atrial contraction)    per prior Holter  . Palpitations   . Pneumonia    "as a child"  . PVC's (premature ventricular contractions)    per prior Holter  . SVT (supraventricular tachycardia) ([place])   . Tachycardia    noted at 07/28/11 visit. Started on beta blocker. Possible atrial flutter vs long PR tachycardia/AVNRT  . Type II diabetes mellitus ([place])    Past Surgical History:  Past Surgical History:  Procedure Laterality Date  . CESAREAN SECTION  1985  . CESAREAN SECTION  1985  . SUPRAVENTRICULAR TACHYCARDIA ABLATION  11/29/2013  . SUPRAVENTRICULAR TACHYCARDIA ABLATION N/A 11/29/2013   Procedure: SUPRAVENTRICULAR TACHYCARDIA ABLATION;  Surgeon: Evans Lance, MD;  Location: [facility] CATH LAB;  Service: Cardiovascular;  Laterality: N/A;  . US ECHOCARDIOGRAPHY  07/25/2008   EF 55-60%  . VAGINAL DELIVERY     HPI:  74 year old female with history of type 2 diabetes mellitus, chronic kidney disease stage IIIa, CHF, chronic lymphedema, hypertension, osteoarthritis was brought to the ED by EMS.  -Daughter reported that she had not been able to get in touch with her mom for a couple of days so she called the police to do a safety check on her and found her laying on  the floor covered with stool with bugs crawling all over her. Etiology of AMS unclear at time of eval, MRI shows Wernickes Encephalopathy   Assessment / Plan / Recommendation Clinical Impression  Though pt is confused and needs assist with self feeding, she exhibits no signs of aspiration. She is noted to have multiple swallows per sip of liquid, but vocal quality remained dry post swallow and she was able to consume a reasonable quantity of liquid without increased concern. Recommend pt continue a regular diet and thin liquids. No f/u needed at this time. SLP Visit Diagnosis: Dysphagia, unspecified (R13.10)    Aspiration Risk  Risk for inadequate nutrition/hydration    Diet Recommendation Regular;Thin liquid   Liquid Administration via: Cup;Straw Medication Administration: Whole meds with liquid Supervision: Staff to assist with self feeding Postural Changes: Seated upright at 90 degrees    Other  Recommendations Oral Care Recommendations: Oral care BID   Follow up Recommendations None      Frequency and Duration            Prognosis        Swallow Study   General HPI: 74 year old female with history of type 2 diabetes mellitus, chronic kidney disease stage IIIa, CHF, chronic lymphedema, hypertension, osteoarthritis was brought to the ED by EMS.  -Daughter reported that she had not been able to get in touch with her mom for a couple of days so she called the police to do  a safety check on her and found her laying on the floor covered with stool with bugs crawling all over her. Etiology of AMS unclear at time of eval, MRI shows Wernickes Encephalopathy Type of Study: Bedside Swallow Evaluation Diet Prior to this Study: Regular;Thin liquids Temperature Spikes Noted: No Respiratory Status: Room air History of Recent Intubation: No Behavior/Cognition: Confused;Requires cueing Oral Cavity Assessment: Within Functional Limits Oral Care Completed by SLP: No Oral Cavity - Dentition:  Poor condition Vision: Functional for self-feeding Self-Feeding Abilities: Needs assist Patient Positioning: Upright in bed Baseline Vocal Quality: Normal Volitional Cough: Cognitively unable to elicit Volitional Swallow: Unable to elicit    Oral/Motor/Sensory Function Overall Oral Motor/Sensory Function: Generalized oral weakness   Ice Chips     Thin Liquid Thin Liquid: Within functional limits Presentation: Cup;Straw    Nectar Thick     Honey Thick     Puree Puree: Within functional limits Presentation: Spoon   Solid     Solid: Within functional limits      Xhaiden Coombs, Katherene Ponto 03/19/2020,11:07 AM

## 2020-03-19 NOTE — Evaluation (Signed)
Physical Therapy Evaluation Patient Details Name: [name] [name] MRN: [id number] DOB: 1945/06/23 Today's Date: 03/19/2020   History of Present Illness  Pt is a 74 y.o. female admitted 03/16/20 found down by EMS after daughter requested wellness check having not heard from her for a couple days. Workup for metabolic encephalopathy, ARF, dehydration. Pt with L hip pain, xray with moderate osteoarthritis. Head CT negative for acute abnormality; age-related atrophy. MRI suggestive of Wenicke encephalopathy. PMH includes CKD3, CHF, DM2, HTN, OA, chronic lymphedema, venous stasis ulcers.    Clinical Impression  Pt presents with an overall decrease in functional mobility secondary to above. Pt lethargic and poor historian, only oriented to self; daughter present and reports that PTA, pt mod indep with rollator, lives alone and able to care for self. Today, pt required maxA+2 to [place] for limited mobility; tolerated sitting EOB but actively resisting mobility, including attempts to stand. Difficult to determine true muscular and cognitive impairment due to decreased interaction and minimal verbalizations, although pt following some commands Pt would benefit from continued acute PT services to maximize functional mobility and independence prior to d/c with SNF-level therapies.     Follow Up Recommendations SNF;Supervision/Assistance - 24 hour    Equipment Recommendations  Rolling walker with 5" wheels;3in1 (PT);Wheelchair (measurements PT);Wheelchair cushion (measurements PT);Hospital bed (hoyer lift)    Recommendations for Other Services       Precautions / Restrictions Precautions Precautions: Fall Restrictions Weight Bearing Restrictions: No      Mobility  Bed Mobility Overal bed mobility: Needs Assistance Bed Mobility: Supine to Sit;Sit to Supine     Supine to sit: Max assist;+2 for physical assistance;HOB elevated Sit to supine: Total assist;+2 for physical assistance   General bed  mobility comments: MaxA+2 to manage BLEs, scoot hips to EOB and assist trunk elevation; pt resistant to movement, fearful of falling; totalA+2 return to supine and repositioning    Transfers Overall transfer level: Needs assistance               General transfer comment: 2x attempts at standing from EOB, pt unable to offload buttocks despite maxA+2, actively resisting standing with no anterior weight translation noted; fearful of falling  Ambulation/Gait                Stairs            Wheelchair Mobility    Modified Rankin (Stroke Patients Only)       Balance Overall balance assessment: Needs assistance   Sitting balance-Leahy Scale: Fair Sitting balance - Comments: Initial maxA for trunk support, progressing to min guard for sitting balance                                     Pertinent Vitals/Pain Pain Assessment: Faces Faces Pain Scale: Hurts little more Pain Location: BLEs with mobility (specifically R knee with PROM flexion) Pain Descriptors / Indicators: Grimacing;Moaning Pain Intervention(s): Monitored during session;Limited activity within patient's tolerance;Repositioned    Home Living Family/patient expects to be discharged to:: Skilled nursing facility Living Arrangements: Alone               Additional Comments: Pt poor historian. Daughter present to provide home living and PLOF information. Children live nearby to pt    Prior Function Level of Independence: Independent with assistive device(s)         Comments: Mod indep ambulating with rollator. Uses [employer] for transportation; was  doing her own grocery shopping. Mod indep with cooking, dressing, toileting; able to get into and out of shower, but family questions if pt bathing herself adequately     Hand Dominance   Dominant Hand: Right    Extremity/Trunk Assessment   Upper Extremity Assessment Upper Extremity Assessment: RUE deficits/detail;LUE  deficits/detail;Difficult to assess due to impaired cognition RUE Deficits / Details: Strong grip strength, strong resistance with elbow extension, but difficulty flexing to command and apparent <3/5 elbow flex; dysmetria noted with attempts to shake hand (unsure if actual coordination deficit or due to visual changes); functionally <3/5 shoulder strength; can shrug shoulders RUE Coordination: decreased gross motor;decreased fine motor LUE Deficits / Details: Strong grip strength, strong resistance with elbow extension, but difficulty flexing to command and apparent <3/5 elbow flex; dysmetria noted with attempts to shake hand (unsure if actual coordination deficit or due to visual changes); functionally <3/5 shoulder strength; can shrug shoulders LUE Coordination: decreased gross motor;decreased fine motor    Lower Extremity Assessment Lower Extremity Assessment: RLE deficits/detail;LLE deficits/detail;Difficult to assess due to impaired cognition RLE Deficits / Details: Functionally <3/5 throughout; R active DF noted, minimal knee flex/ext; dry, flaking, red skin on lower legs and feet RLE Coordination: decreased gross motor;decreased fine motor LLE Deficits / Details: Functionally <3/5 throughout; L active DF noted, minimal knee flex/ext; dry, flaking, red skin on lower legs and feet LLE Coordination: decreased gross motor;decreased fine motor       Communication   Communication: Expressive difficulties;Receptive difficulties (soft, mumbling speech with minimal verbalizations)  Cognition Arousal/Alertness: Lethargic Behavior During Therapy: Flat affect Overall Cognitive Status: Difficult to assess Area of Impairment: Orientation;Attention;Following commands;Safety/judgement;Awareness;Problem solving                 Orientation Level: Disoriented to;Place;Time;Situation Current Attention Level: Focused   Following Commands: Follows one step commands inconsistently Safety/Judgement:  Decreased awareness of deficits;Decreased awareness of safety Awareness: Intellectual Problem Solving: Slow processing;Decreased initiation;Difficulty sequencing;Requires verbal cues;Requires tactile cues General Comments: Daughter reports baseline cognition WFL. Today, pt with soft, mumbling speech and minimal verbalizations; able to state yes/no, name and c/o pain, from [place] (but unable to state town). Not answering daughter's name. Following majority of simple commands at rest; decreased command following once mobilizing; apparent cognitive deficits likely exacerbated by fearful of falling (pt stating, "Don't let me fall... you're going to let me fall")      General Comments General comments (skin integrity, edema, etc.): Daughter present and supportive    Exercises     Assessment/Plan    PT Assessment Patient needs continued PT services  PT Problem List Decreased strength;Decreased range of motion;Decreased activity tolerance;Decreased balance;Decreased mobility;Decreased cognition;Decreased coordination;Decreased knowledge of use of DME;Decreased safety awareness;Pain;Obesity;Decreased skin integrity       PT Treatment Interventions DME instruction;Gait training;Functional mobility training;Therapeutic activities;Therapeutic exercise;Balance training;Neuromuscular re-education;Cognitive remediation;Patient/family education;Wheelchair mobility training    PT Goals (Current goals can be found in the Care Plan section)  Acute Rehab PT Goals Patient Stated Goal: Agreeable to post-acute rehab services PT Goal Formulation: With family Time For Goal Achievement: 04/02/20 Potential to Achieve Goals: Fair    Frequency Min 2X/week   Barriers to discharge Decreased caregiver support      Co-evaluation PT/OT/SLP Co-Evaluation/Treatment: Yes Reason for Co-Treatment: Complexity of the patient's impairments (multi-system involvement);Necessary to address cognition/behavior during  functional activity;For patient/therapist safety;To address functional/ADL transfers PT goals addressed during session: Mobility/safety with mobility;Balance         AM-PAC PT "6 Clicks" Mobility  Outcome Measure Help needed turning from your back to your side while in a flat bed without using bedrails?: A Lot Help needed moving from lying on your back to sitting on the side of a flat bed without using bedrails?: Total Help needed moving to and from a bed to a chair (including a wheelchair)?: Total Help needed standing up from a chair using your arms (e.g., wheelchair or bedside chair)?: Total Help needed to walk in hospital room?: Total Help needed climbing 3-5 steps with a railing? : Total 6 Click Score: 7    End of Session Equipment Utilized During Treatment: Gait belt Activity Tolerance: Patient limited by fatigue Patient left: in bed;with call bell/phone within reach;with family/visitor present Nurse Communication: Mobility status;Need for lift equipment PT Visit Diagnosis: Other abnormalities of gait and mobility (R26.89);Muscle weakness (generalized) (M62.81)    Time: 8099-8338 PT Time Calculation (min) (ACUTE ONLY): 25 min   Charges:   PT Evaluation $PT Eval Moderate Complexity: [place], PT, DPT Acute Rehabilitation Services  Pager [phone] Office [phone]  Derry Lory 03/19/2020, 2:09 PM

## 2020-03-19 NOTE — Evaluation (Addendum)
Occupational Therapy Evaluation Patient Details Name: [name] [name] MRN: [id number] DOB: July 27, 1945 Today's Date: 03/19/2020    History of Present Illness Pt is a 74 y.o. female admitted 03/16/20 found down by EMS after daughter requested wellness check having not heard from her for a couple days. Workup for metabolic encephalopathy, ARF, dehydration. Pt with L hip pain, xray with moderate osteoarthritis. Head CT negative for acute abnormality; age-related atrophy. MRI suggestive of Wenicke encephalopathy. PMH includes CKD3, CHF, DM2, HTN, OA, chronic lymphedema, venous stasis ulcers.   Clinical Impression   Pt presents with decline in function and safety with ADLs and ADL mobility with impaired strength, balance, endurance and cognition. Pt with functional deficits listed below. Pt currently requires max A+2 to manage BLEs, scoot hips to EOB and assist trunk elevation; pt resistant to movement, fearful of falling; total A +2 return to supine and repositioning.x 2 attempts at standing from EOB, pt unable to offload buttocks despite max A+2, actively resisting standing with no anterior weight translation noted; fearful of falling. pt washed face seated at EOB with min verbal cues and placing wash rag in R hand to initiate. Pt did not follow commands for any other ADL tasks. Pt would benefit from acute OT services to address impairments to maximize level of function and safety    Follow Up Recommendations  SNF    Equipment Recommendations  3 in 1 bedside commode;Wheelchair (measurements OT)    Recommendations for Other Services       Precautions / Restrictions Precautions Precautions: Fall Restrictions Weight Bearing Restrictions: No      Mobility Bed Mobility Overal bed mobility: Needs Assistance Bed Mobility: Supine to Sit;Sit to Supine     Supine to sit: Max assist;+2 for physical assistance;HOB elevated Sit to supine: Total assist;+2 for physical assistance   General bed  mobility comments: Max A+2 to manage BLEs, scoot hips to EOB and assist trunk elevation; pt resistant to movement, fearful of falling; tota lA +2 return to supine and repositioning    Transfers Overall transfer level: Needs assistance               General transfer comment: x 2 attempts at standing from EOB, pt unable to offload buttocks despite max A+2, actively resisting standing with no anterior weight translation noted; fearful of falling    Balance Overall balance assessment: Needs assistance   Sitting balance-Leahy Scale: Fair Sitting balance - Comments: Initial max A for trunk support, progressing to min guard for sitting balance                                   ADL either performed or assessed with clinical judgement   ADL                                               Vision Baseline Vision/History: Wears glasses (unable to properly assess due to cogniton, pt keeping eyes closed most of session, over reaching for therapist's hand on command. Pt's daughter states that she wears reading glasses) Wears Glasses: Reading only Additional Comments: unable to properly assess due to cogniton, pt keeping eyes closed most of session, over reaching for therapist's hand on command. Pt's daughter states that she wears reading glasses     Perception     Praxis  Pertinent Vitals/Pain Pain Assessment: Faces Faces Pain Scale: Hurts little more Pain Location: BLEs with mobility (specifically R knee with PROM flexion) Pain Descriptors / Indicators: Grimacing;Moaning Pain Intervention(s): Limited activity within patient's tolerance;Monitored during session;Repositioned     Hand Dominance Right   Extremity/Trunk Assessment Upper Extremity Assessment Upper Extremity Assessment: RUE deficits/detail;LUE deficits/detail;Difficult to assess due to impaired cognition;Generalized weakness RUE Deficits / Details: Strong grip strength, strong  resistance with elbow extension, but difficulty flexing to command and apparent <3/5 elbow flex; dysmetria noted with attempts to shake hand (unsure if actual coordination deficit or due to visual changes); functionally <3/5 shoulder strength; can shrug shoulders RUE Coordination: decreased gross motor;decreased fine motor LUE Deficits / Details: Strong grip strength, strong resistance with elbow extension, but difficulty flexing to command and apparent <3/5 elbow flex; dysmetria noted with attempts to shake hand (unsure if actual coordination deficit or due to visual changes); functionally <3/5 shoulder strength; can shrug shoulders LUE Coordination: decreased gross motor;decreased fine motor   Lower Extremity Assessment Lower Extremity Assessment: Defer to PT evaluation     Communication Communication Communication: Expressive difficulties;Receptive difficulties   Cognition Arousal/Alertness: Lethargic Behavior During Therapy: Flat affect Overall Cognitive Status: Difficult to assess Area of Impairment: Orientation;Attention;Following commands;Safety/judgement;Awareness;Problem solving                 Orientation Level: Disoriented to;Place;Time;Situation Current Attention Level: Focused   Following Commands: Follows one step commands inconsistently Safety/Judgement: Decreased awareness of deficits;Decreased awareness of safety Awareness: Intellectual Problem Solving: Slow processing;Decreased initiation;Difficulty sequencing;Requires verbal cues;Requires tactile cues General Comments: Daughter reports baseline cognition WFL. Today, pt with soft, mumbling speech and minimal verbalizations; able to state yes/no, name and c/o pain, from [place] (but unable to state town). Not answering daughter's name. Following majority of simple commands at rest; decreased command following once mobilizing; apparent cognitive deficits likely exacerbated by fearful of falling (pt stating,  "Don't let me fall... you're going to let me fall")   General Comments  Daughter present and supportive    Exercises     Shoulder Instructions      Home Living Family/patient expects to be discharged to:: Skilled nursing facility Living Arrangements: Alone                               Additional Comments: Pt poor historian. Daughter present to provide home living and PLOF information. Children live nearby to pt      Prior Functioning/Environment Level of Independence: Independent with assistive device(s)        Comments: Mod indep ambulating with rollator. Uses [employer] for transportation; was doing her own grocery shopping. Mod indep with cooking, dressing, toileting; able to get into and out of shower, but family questions if pt bathing herself adequately        OT Problem List: Decreased strength;Decreased range of motion;Decreased activity tolerance;Impaired balance (sitting and/or standing);Decreased cognition;Pain;Decreased safety awareness;Decreased coordination;Decreased knowledge of use of DME or AE      OT Treatment/Interventions: Self-care/ADL training;Therapeutic exercise;Neuromuscular education;DME and/or AE instruction;Balance training;Patient/family education    OT Goals(Current goals can be found in the care plan section) Acute Rehab OT Goals Patient Stated Goal: Agreeable to post-acute rehab services OT Goal Formulation: With patient/family Time For Goal Achievement: 04/02/20 Potential to Achieve Goals: Good  OT Frequency: Min 2X/week   Barriers to D/C:            Co-evaluation PT/OT/SLP Co-Evaluation/Treatment: Yes Reason for Co-Treatment:  Complexity of the patient's impairments (multi-system involvement);Necessary to address cognition/behavior during functional activity;For patient/therapist safety;To address functional/ADL transfers PT goals addressed during session: Mobility/safety with mobility;Balance OT goals addressed during session:  ADL's and self-care      AM-PAC OT "6 Clicks" Daily Activity     Outcome Measure Help from another person eating meals?: A Lot Help from another person taking care of personal grooming?: A Little Help from another person toileting, which includes using toliet, bedpan, or urinal?: Total Help from another person bathing (including washing, rinsing, drying)?: Total Help from another person to put on and taking off regular upper body clothing?: Total Help from another person to put on and taking off regular lower body clothing?: Total 6 Click Score: 9   End of Session Equipment Utilized During Treatment: Gait belt  Activity Tolerance: Patient limited by fatigue;Other (comment) (cognition) Patient left: in chair;with call bell/phone within reach;with bed alarm set;with family/visitor present;with nursing/sitter in room  OT Visit Diagnosis: Other abnormalities of gait and mobility (R26.89);Muscle weakness (generalized) (M62.81);History of falling (Z91.81);Pain;Other symptoms and signs involving cognitive function;Cognitive communication deficit (R41.841) Pain - Right/Left: Right Pain - part of body: Leg                Time: 1324-1350 OT Time Calculation (min): 26 min Charges:  OT Evaluation $OT Eval Moderate Complexity: 1 Mod    Britt Bottom 03/19/2020, 4:02 PM

## 2020-03-19 NOTE — Progress Notes (Signed)
PROGRESS NOTE    Jaselyn Nahm  JJO:[id number] DOB: 10/12/1945 DOA: 03/16/2020 PCP: Hoyt Koch, MD  Brief Narrative: 73/F with history of type 2 diabetes mellitus, chronic kidney disease stage IIIa, CHF, chronic lymphedema, hypertension, osteoarthritis was brought to the ED by EMS. -Daughter reported that she had not been able to get in touch with her mom for a couple of days so she called the police to do a safety check on her and found her laying on the floor covered with stool with bugs crawling all over her. -In addition on chart review it appears that she was recently seen by her PCP for intractable nausea and vomiting for 4 to 6 weeks associated with profound weakness, was also being treated for UTI, also seen in the emergency room on 1 such occasion, had a CT abdomen pelvis 11/19 which was unremarkable. -Patient unable to provide any history at this time -Labs were notable for hypernatremia, creatinine of 3.4, white count was normal, CK was mildly elevated at 880, lactic acid was 2.1, urinalysis was abnormal, CT head noted age-related atrophy   Assessment & Plan:   Metabolic encephalopathy -likely multifactorial from acute renal failure, dehydration, hypernatremia etc -Urine cx with only 20K colonies of enterococcus, doubt this is contributing but will complete 5day course of Abx, change ceftriaxone to fosfomycin -CT head, ammonia level, TSH and a.m. cortisol are normal, I ordered an MRI brain due to slurred and extremely limited speech, this is concerning for Wernicke's encephalopathy, will check Vitamin B1 level, start High dose IV thiamine -will ask Neurology for input -also check B12 -PT OT eval  Chronic nausea and vomiting -Etiology of this remains unclear -Recent CT abdomen pelvis on 11/19 was unremarkable -No culprit medications noted -TSH and a.m. cortisol are also normal -Will need gastroenterology evaluation inpatient if she has recurrence of nausea and  vomiting once her mental status stabilizes  Acute kidney injury on CKD stage IIIa -Secondary to dehydration with ongoing ARB and Lasix use -Hold diuretic, ARB -Creatinine improving -Change IV fluids to D5 water due to hypernatremia -BMP in a.m.  Left hip pain w/ movement -Xray notes, mod osteoarthritis -Pain control  Type 2 diabetes mellitus -Recent hemoglobin A1c is 6.8 -Metformin on hold -Continue sliding scale insulin  Hypertension -Stable, holding losartan  Chronic lymphedema  Sacral decubitus ulcers -Secondary to moisture associated skin damage, partial-thickness wounds  -wound nurse consult appreciated  DVT prophylaxis: Heparin subcutaneous Code Status: Full code Family Communication: No family at bedside called and updated daughter Cathlean Cower 12/12 and 12/13 Disposition Plan:  Status is: Inpatient  Remains inpatient appropriate because:Inpatient level of care appropriate due to severity of illness   Dispo: The patient is from: Home              Anticipated d/c is to: SNF              Anticipated d/c date is: > 3 days              Patient currently is not medically stable to d/c.  Consultants: Neurology   Procedures:   Antimicrobials:    Subjective: -Still moaning, mumbles a few words here and there, awake, eyes open -Oral intake is poor  Objective: Vitals:   03/18/20 1426 03/18/20 2000 03/19/20 0500 03/19/20 1100  BP: (!) 118/59 114/68 124/84   Pulse: 87 81 82   Resp: 17 18 19    Temp: (!) 97.2 F (36.2 C) 98.3 F (36.8 C) 98.1 F (36.7 C)  TempSrc: Oral Axillary Axillary   SpO2: 97% 100% 100%   Weight:    113.4 kg  Height:        Intake/Output Summary (Last 24 hours) at 03/19/2020 1203 Last data filed at 03/19/2020 9211 Gross per 24 hour  Intake 268.62 ml  Output 250 ml  Net 18.62 ml   Filed Weights   03/19/20 1100  Weight: 113.4 kg    Examination:  General exam: Elderly female laying in bed, awake, eyes open, moaning, answers a  few questions occasionally with single word responses, unable to assess orientation  CVS: S1-S2, regular rate rhythm Lungs: Decreased breath sounds the bases Abdomen: Obese, soft, nontender, bowel sounds present Extremities: Lymphedema with hyper and hypopigmented areas of both lower legs Skin: Sacral decubitus ulcers noted  Psychiatry: Flat affect, unable to assess   Data Reviewed:   CBC: Recent Labs  Lab 03/16/20 2321 03/17/20 0002 03/17/20 0520 03/18/20 0414 03/19/20 0127  WBC  --  8.4 8.2 6.6 6.0  NEUTROABS  --  6.9  --   --   --   HGB 13.6 13.5 11.5* 11.2* 10.9*  HCT 40.0 44.1 37.3 34.0* 35.8*  MCV  --  80.5 80.2 78.3* 80.1  PLT  --  231 206 189 941   Basic Metabolic Panel: Recent Labs  Lab 03/16/20 2321 03/17/20 0002 03/17/20 0520 03/18/20 0414 03/19/20 0127  NA 147* 148*  --  151* 151*  K 3.1* 3.8  --  3.5 3.5  CL  --  103  --  111 112*  CO2  --  26  --  23 25  GLUCOSE  --  154*  --  100* 100*  BUN  --  80*  --  69* 62*  CREATININE  --  3.46* 3.08* 2.35* 1.84*  CALCIUM  --  9.9  --  8.9 8.8*   GFR: Estimated Creatinine Clearance: 37.2 mL/min (A) (by C-G formula based on SCr of 1.84 mg/dL (H)). Liver Function Tests: Recent Labs  Lab 03/17/20 0002 03/18/20 0414 03/19/20 0127  AST 63* 40 34  ALT 49* 33 30  ALKPHOS 51 46 46  BILITOT 0.9 0.9 0.8  PROT 7.4 6.2* 5.9*  ALBUMIN 3.3* 2.5* 2.4*   No results for input(s): LIPASE, AMYLASE in the last 168 hours. Recent Labs  Lab 03/17/20 0002  AMMONIA 23   Coagulation Profile: No results for input(s): INR, PROTIME in the last 168 hours. Cardiac Enzymes: Recent Labs  Lab 03/17/20 0002 03/17/20 1649  CKTOTAL 881* 436*   BNP (last 3 results) No results for input(s): PROBNP in the last 8760 hours. HbA1C: No results for input(s): HGBA1C in the last 72 hours. CBG: Recent Labs  Lab 03/18/20 1309 03/18/20 1839 03/18/20 2203 03/19/20 0753 03/19/20 1159  GLUCAP 89 96 93 98 111*   Lipid  Profile: No results for input(s): CHOL, HDL, LDLCALC, TRIG, CHOLHDL, LDLDIRECT in the last 72 hours. Thyroid Function Tests: Recent Labs    03/17/20 1150  TSH 1.622   Anemia Panel: Recent Labs    03/19/20 0903  VITAMINB12 964*   Urine analysis:    Component Value Date/Time   COLORURINE AMBER (A) 03/17/2020 0345   APPEARANCEUR TURBID (A) 03/17/2020 0345   LABSPEC 1.023 03/17/2020 0345   PHURINE 5.0 03/17/2020 0345   GLUCOSEU NEGATIVE 03/17/2020 0345   GLUCOSEU NEGATIVE 02/02/2020 1220   HGBUR SMALL (A) 03/17/2020 0345   BILIRUBINUR MODERATE (A) 03/17/2020 0345   BILIRUBINUR Neg 10/22/2017 0950   KETONESUR NEGATIVE  03/17/2020 0345   PROTEINUR 30 (A) 03/17/2020 0345   UROBILINOGEN 0.2 02/02/2020 1220   NITRITE NEGATIVE 03/17/2020 0345   LEUKOCYTESUR LARGE (A) 03/17/2020 0345   Sepsis Labs: @LABRCNTIP (procalcitonin:4,lacticidven:4)  ) Recent Results (from the past 240 hour(s))  Resp Panel by RT-PCR (Flu A&B, Covid) Nasopharyngeal Swab     Status: None   Collection Time: 03/16/20 11:13 PM   Specimen: Nasopharyngeal Swab; Nasopharyngeal(NP) swabs in vial transport medium  Result Value Ref Range Status   SARS Coronavirus 2 by RT PCR NEGATIVE NEGATIVE Final    Comment: (NOTE) SARS-CoV-2 target nucleic acids are NOT DETECTED.  The SARS-CoV-2 RNA is generally detectable in upper respiratory specimens during the acute phase of infection. The lowest concentration of SARS-CoV-2 viral copies this assay can detect is 138 copies/mL. A negative result does not preclude SARS-Cov-2 infection and should not be used as the sole basis for treatment or other patient management decisions. A negative result may occur with  improper specimen collection/handling, submission of specimen other than nasopharyngeal swab, presence of viral mutation(s) within the areas targeted by this assay, and inadequate number of viral copies(<138 copies/mL). A negative result must be combined with clinical  observations, patient history, and epidemiological information. The expected result is Negative.  Fact Sheet for Patients:  EntrepreneurPulse.com.au  Fact Sheet for Healthcare Providers:  IncredibleEmployment.be  This test is no t yet approved or cleared by the [place] FDA and  has been authorized for detection and/or diagnosis of SARS-CoV-2 by FDA under an Emergency Use Authorization (EUA). This EUA will remain  in effect (meaning this test can be used) for the duration of the COVID-19 declaration under Section 564(b)(1) of the Act, 21 U.S.C.section 360bbb-3(b)(1), unless the authorization is terminated  or revoked sooner.       Influenza A by PCR NEGATIVE NEGATIVE Final   Influenza B by PCR NEGATIVE NEGATIVE Final    Comment: (NOTE) The Xpert Xpress SARS-CoV-2/FLU/RSV plus assay is intended as an aid in the diagnosis of influenza from Nasopharyngeal swab specimens and should not be used as a sole basis for treatment. Nasal washings and aspirates are unacceptable for Xpert Xpress SARS-CoV-2/FLU/RSV testing.  Fact Sheet for Patients: EntrepreneurPulse.com.au  Fact Sheet for Healthcare Providers: IncredibleEmployment.be  This test is not yet approved or cleared by the [place] FDA and has been authorized for detection and/or diagnosis of SARS-CoV-2 by FDA under an Emergency Use Authorization (EUA). This EUA will remain in effect (meaning this test can be used) for the duration of the COVID-19 declaration under Section 564(b)(1) of the Act, 21 U.S.C. section 360bbb-3(b)(1), unless the authorization is terminated or revoked.  Performed at [facility], [place] [address], [place], [place] [address]   Blood culture (routine x 2)     Status: None (Preliminary result)   Collection Time: 03/17/20  1:04 AM   Specimen: BLOOD  Result Value Ref Range Status   Specimen Description BLOOD RIGHT  ANTECUBITAL  Final   Special Requests   Final    BOTTLES DRAWN AEROBIC AND ANAEROBIC Blood Culture results may not be optimal due to an inadequate volume of blood received in culture bottles   Culture   Final    NO GROWTH 1 DAY Performed at [facility], [place] [address], [place],  [address]    Report Status PENDING  Incomplete  Blood culture (routine x 2)     Status: None (Preliminary result)   Collection Time: 03/17/20  2:07 AM  Specimen: BLOOD RIGHT HAND  Result Value Ref Range Status   Specimen Description BLOOD RIGHT HAND  Final   Special Requests   Final    BOTTLES DRAWN AEROBIC AND ANAEROBIC Blood Culture results may not be optimal due to an inadequate volume of blood received in culture bottles   Culture   Final    NO GROWTH 1 DAY Performed at [facility], [place] [address] [place], [place] [address]    Report Status PENDING  Incomplete  Urine culture     Status: Abnormal   Collection Time: 03/17/20 11:50 AM   Specimen: Urine, Random  Result Value Ref Range Status   Specimen Description URINE, RANDOM  Final   Special Requests   Final    NONE Performed at [facility], [place] [address], [place], [place] [address]    Culture 20,000 COLONIES/mL ENTEROCOCCUS FAECALIS (A)  Final   Report Status 03/19/2020 FINAL  Final   Organism ID, Bacteria ENTEROCOCCUS FAECALIS (A)  Final      Susceptibility   Enterococcus faecalis - MIC*    AMPICILLIN <=2 SENSITIVE Sensitive     NITROFURANTOIN <=16 SENSITIVE Sensitive     VANCOMYCIN 2 SENSITIVE Sensitive     * 20,000 COLONIES/mL ENTEROCOCCUS FAECALIS         Radiology Studies: MR BRAIN WO CONTRAST  Result Date: 03/18/2020 CLINICAL DATA:  Mental status change, unknown cause. EXAM: MRI HEAD WITHOUT CONTRAST TECHNIQUE: Multiplanar, multiecho pulse sequences of the brain and surrounding structures were obtained without intravenous contrast. COMPARISON:  Prior head CT examinations 03/17/2020 and earlier. FINDINGS:  Brain: Mild cerebral and cerebellar atrophy. Diffusion-weighted and T2/FLAIR hyperintense signal abnormality within the medial thalami, periaqueductal gray matter and dorsal medulla (for instance as seen on series 5, images 79-81 and image 74) series 9, images 14 and 11) (series 9, image 6). Mild ill-defined and scattered T2/FLAIR hyperintensity within the cerebral white matter is nonspecific, but compatible with chronic small vessel ischemic disease. No evidence of intracranial mass. No chronic intracranial blood products. No extra-axial fluid collection. No midline shift. Vascular: Expected proximal arterial flow voids. Skull and upper cervical spine: No focal marrow lesion. Cervical spondylosis. 2 mm C2-C3 grade 1 anterolisthesis. 5 mm C3-C4 grade 1 retrolisthesis which contributes to suspected at least moderate spinal canal stenosis and contacts the ventral spinal cord. A C4-C5 posterior disc osteophyte complex also contributes to suspected at least moderate spinal canal stenosis. Sinuses/Orbits: Visualized orbits show no acute finding. Moderate-sized right maxillary sinus mucous retention cyst. Trace ethmoid and maxillary sinus mucosal thickening. Impression #2 will be called to the ordering clinician or representative by the Radiologist Assistant, and communication documented in the PACS or [employer]. IMPRESSION: Diffusion-weighted and T2/FLAIR hyperintense signal abnormality within the medial thalami, periaqueductal gray matter and dorsal medulla. These findings are highly suggestive of Wernicke encephalopathy. Mild generalized parenchymal atrophy and chronic small vessel ischemic disease. Cervical spondylosis. At C3-C4, grade 1 retrolisthesis contributes to suspected at least moderate spinal canal stenosis and contacts the ventral spinal cord. At least moderate spinal canal stenosis is also suspected at C4-C5. Moderate-sized right maxillary sinus mucous retention cyst. Electronically Signed   By: Kellie Simmering DO   On: 03/18/2020 16:36   DG HIP UNILAT WITH PELVIS 2-3 VIEWS LEFT  Result Date: 03/17/2020 CLINICAL DATA:  Left hip pain. EXAM: DG HIP (WITH OR WITHOUT PELVIS) 2-3V LEFT COMPARISON:  None. FINDINGS: There is no evidence of hip fracture or dislocation. Moderate osteophyte formation of  left hip is noted. IMPRESSION: Moderate osteoarthritis of left hip. No acute abnormality seen. Electronically Signed   By: Marijo Conception M.D.   On: 03/17/2020 12:52   Scheduled Meds: . fosfomycin  3 g Oral Once  . heparin  5,000 Units Subcutaneous Q8H  . insulin aspart  0-5 Units Subcutaneous QHS  . insulin aspart  0-9 Units Subcutaneous TID WC   Continuous Infusions: . dextrose 75 mL/hr at 03/19/20 0957  . thiamine injection       LOS: 2 days    Time spent: 25min  Domenic Polite, MD [facility]  03/19/2020, 12:03 PM

## 2020-03-19 NOTE — Progress Notes (Signed)
Urine culture came back with 20k enterococcus. Dr Broadus John said that pt is difficult to eval for symptoms. We will go ahead and change ceftriaxone to one dose of fosfomycin.   Onnie Boer, PharmD, BCIDP, AAHIVP, CPP Infectious Disease Pharmacist 03/19/2020 12:01 PM

## 2020-03-20 ENCOUNTER — Inpatient Hospital Stay (HOSPITAL_COMMUNITY): Payer: PPO

## 2020-03-20 DIAGNOSIS — F802 Mixed receptive-expressive language disorder: Secondary | ICD-10-CM

## 2020-03-20 LAB — CBC
HCT: 33.9 % — ABNORMAL LOW (ref 36.0–46.0)
Hemoglobin: 10.8 g/dL — ABNORMAL LOW (ref 12.0–15.0)
MCH: 25.4 pg — ABNORMAL LOW (ref 26.0–34.0)
MCHC: 31.9 g/dL (ref 30.0–36.0)
MCV: 79.8 fL — ABNORMAL LOW (ref 80.0–100.0)
Platelets: 131 10*3/uL — ABNORMAL LOW (ref 150–400)
RBC: 4.25 MIL/uL (ref 3.87–5.11)
RDW: 20.5 % — ABNORMAL HIGH (ref 11.5–15.5)
WBC: 5.5 10*3/uL (ref 4.0–10.5)
nRBC: 2.7 % — ABNORMAL HIGH (ref 0.0–0.2)

## 2020-03-20 LAB — PHOSPHORUS: Phosphorus: 1.8 mg/dL — ABNORMAL LOW (ref 2.5–4.6)

## 2020-03-20 LAB — BASIC METABOLIC PANEL
Anion gap: 12 (ref 5–15)
BUN: 53 mg/dL — ABNORMAL HIGH (ref 8–23)
CO2: 27 mmol/L (ref 22–32)
Calcium: 9 mg/dL (ref 8.9–10.3)
Chloride: 111 mmol/L (ref 98–111)
Creatinine, Ser: 1.59 mg/dL — ABNORMAL HIGH (ref 0.44–1.00)
GFR, Estimated: 34 mL/min — ABNORMAL LOW (ref >60)
Glucose, Bld: 114 mg/dL — ABNORMAL HIGH (ref 70–99)
Potassium: 3.3 mmol/L — ABNORMAL LOW (ref 3.5–5.1)
Sodium: 150 mmol/L — ABNORMAL HIGH (ref 135–145)

## 2020-03-20 LAB — GLUCOSE, CAPILLARY
Glucose-Capillary: 86 mg/dL (ref 70–99)
Glucose-Capillary: 162 mg/dL — ABNORMAL HIGH (ref 70–99)
Glucose-Capillary: 136 mg/dL — ABNORMAL HIGH (ref 70–99)
Glucose-Capillary: 114 mg/dL — ABNORMAL HIGH (ref 70–99)

## 2020-03-20 IMAGING — CT CT CERVICAL SPINE W/O CM
4 series · 15 of 33 positions shown, 18 images · non-contrast
Comparison: Cervical spine MRI [DATE]

CLINICAL DATA: Myelopathy

EXAM:
CT CERVICAL SPINE WITHOUT CONTRAST
TECHNIQUE: Multidetector CT imaging of the cervical spine was performed without
intravenous contrast. Multiplanar CT image reconstructions were also
generated.

[Series 4: c_spine 2.0 st · axial · 0.33mm/px · z∈[-484,-366]mm · 5 of 84 slices shown, 7 images]
[im 14/84  soft-tissue]
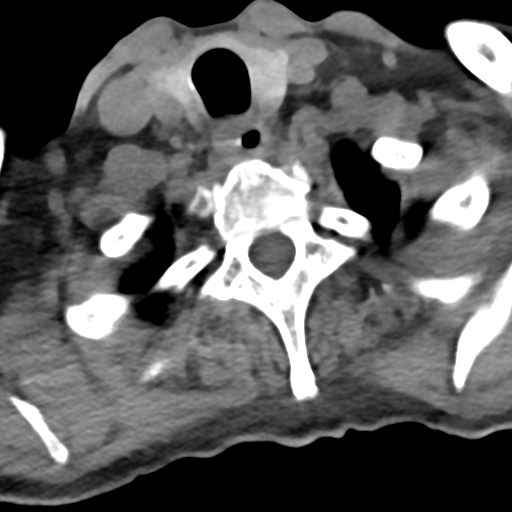
[im 14/84  bone]
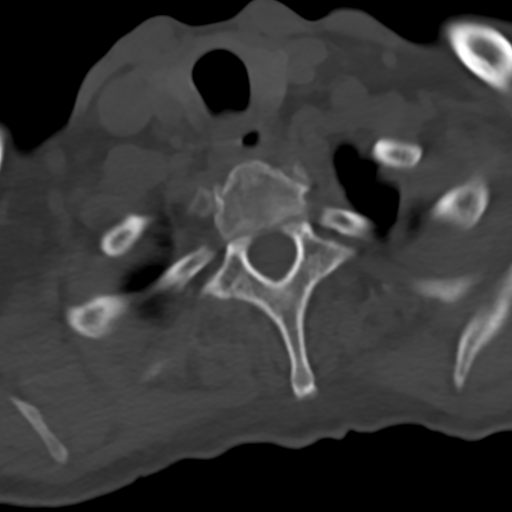
[im 28/84  bone]
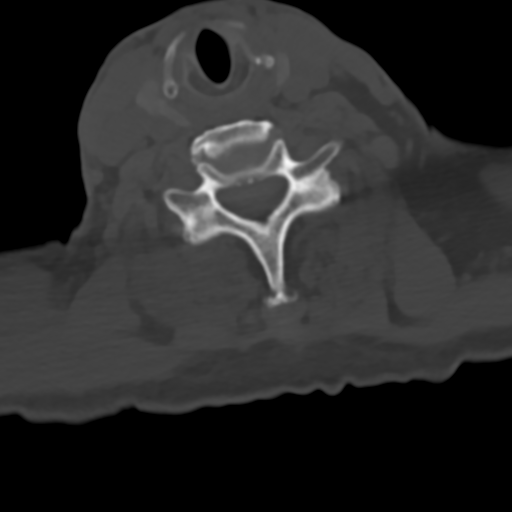
[im 42/84  bone]
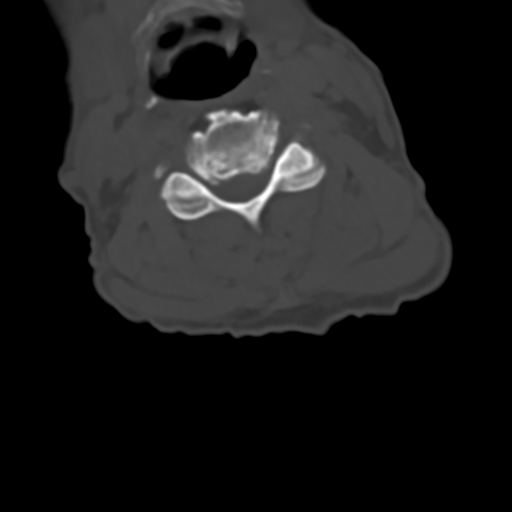
[im 56/84  bone]
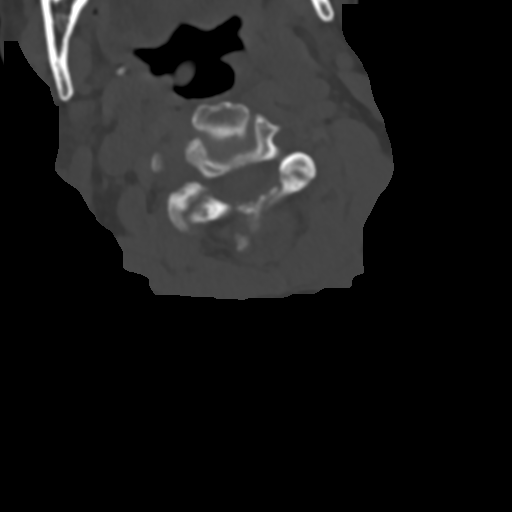
[im 70/84  soft-tissue]
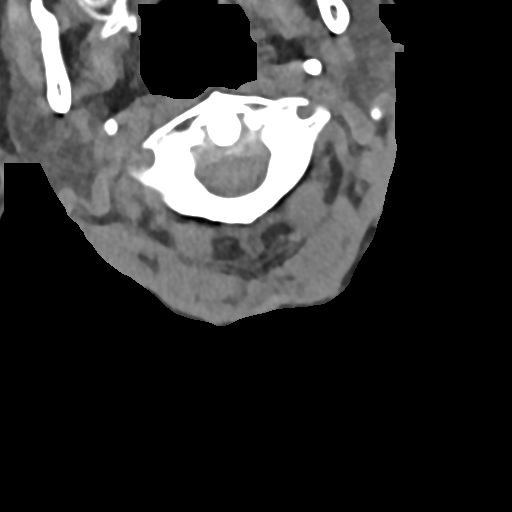
[im 70/84  bone]
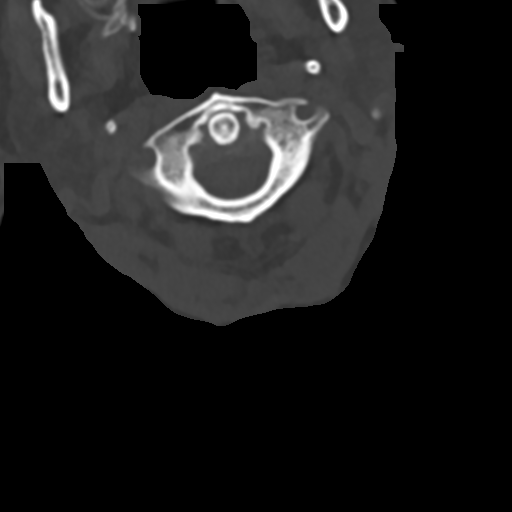

[Series 8: c_spine 2.0 sag bone · sagittal · 0.34mm/px · 5 of 73 slices shown, 6 images]
[im 25/73  bone]
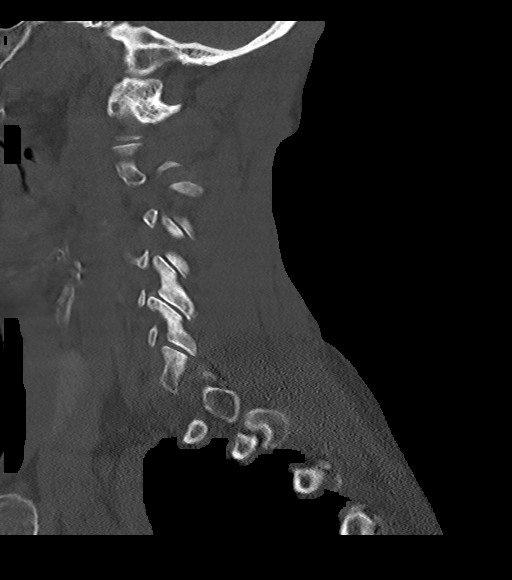
[im 31/73  bone]
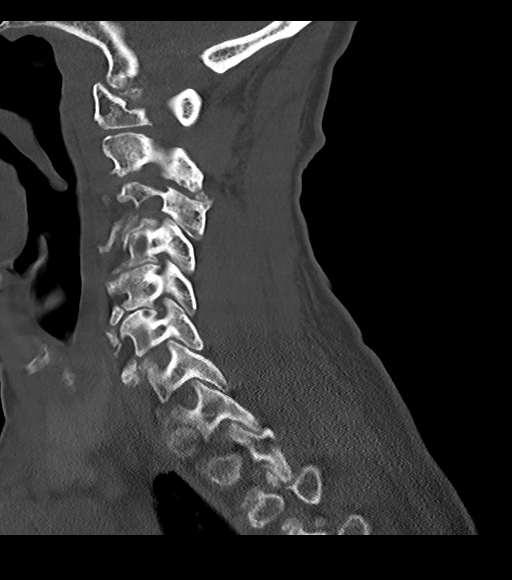
[im 37/73  soft-tissue]
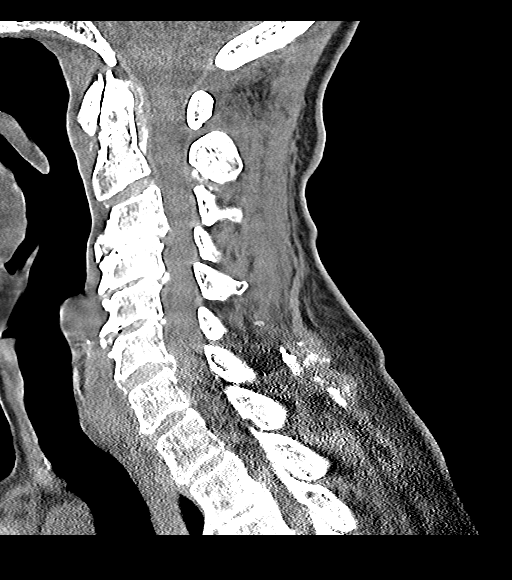
[im 37/73  bone]
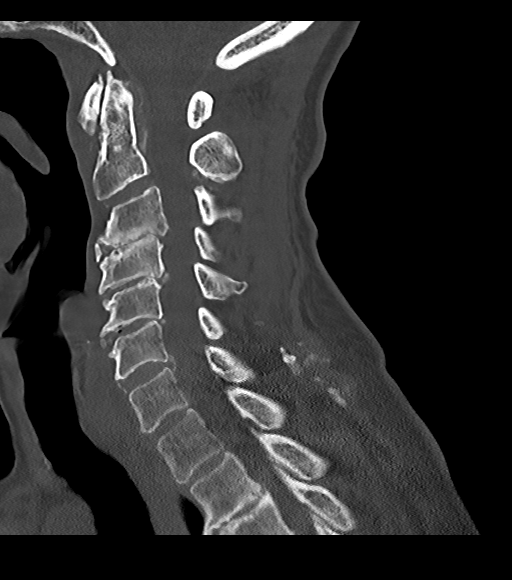
[im 43/73  bone]
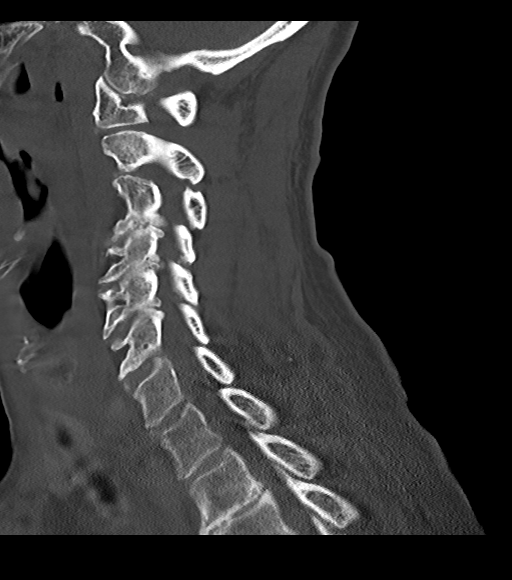
[im 49/73  bone]
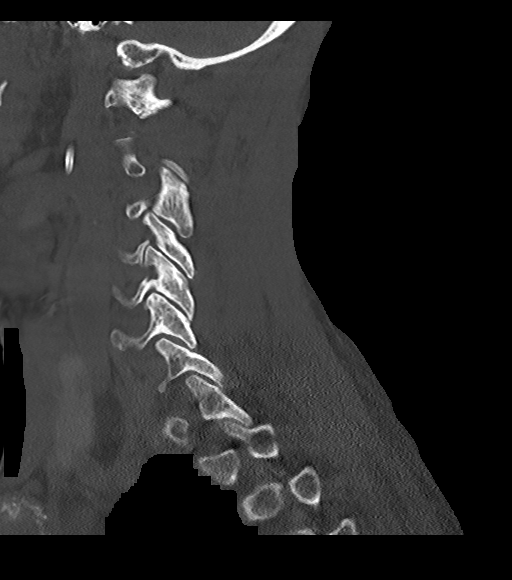

[Series 9: c_spine 2.0 cor bone · coronal · 0.28mm/px · 3 of 88 slices shown]
[im 18/88  bone]
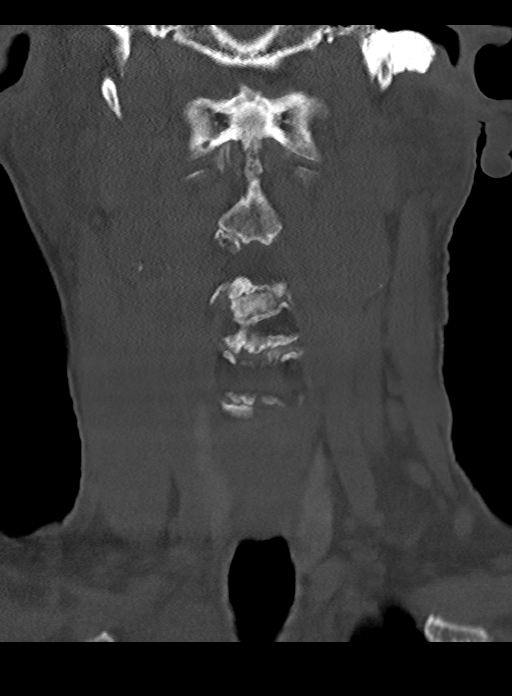
[im 35/88  bone]
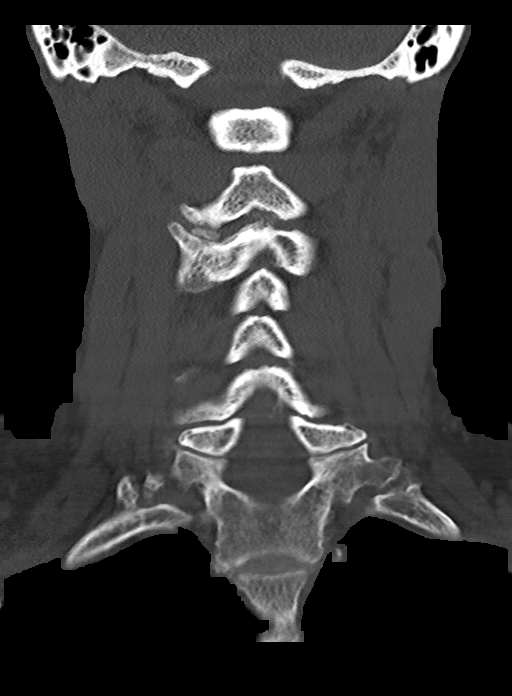
[im 53/88  bone]
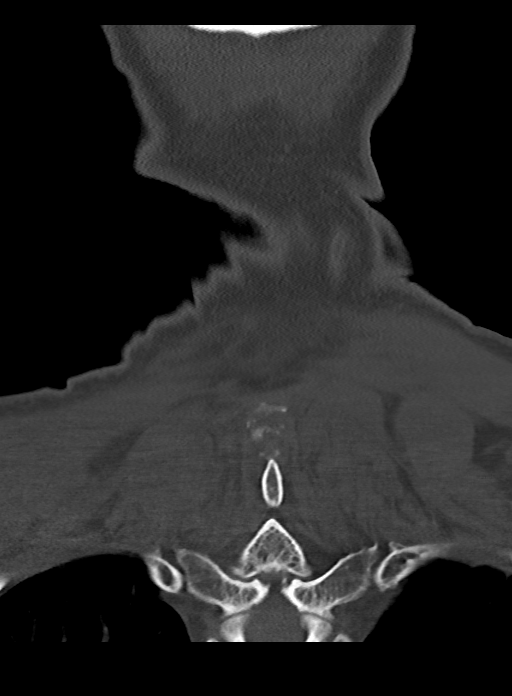

[Series 11: c_spine 2.0 orthogonals · axial · 0.26mm/px · z∈[-513,-489]mm · 2 of 90 slices shown]
[im 15/90  bone]
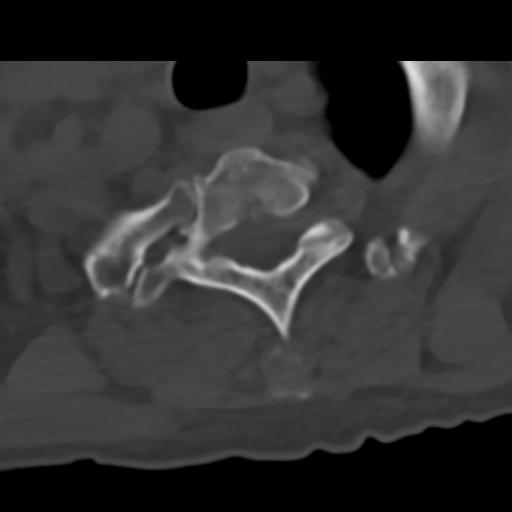
[im 30/90  bone]
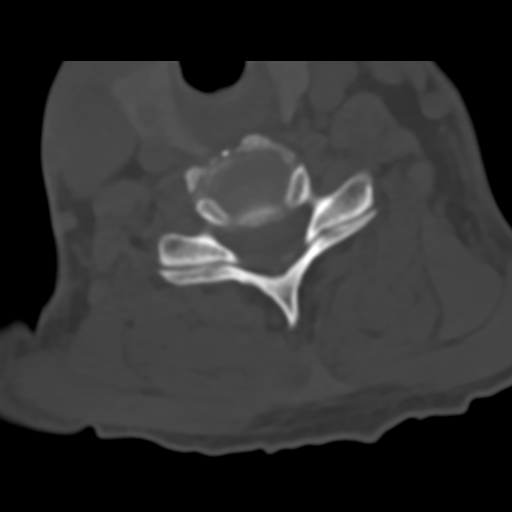

[15 of 33 positions shown; findings below may reference images not displayed]

FINDINGS: Alignment: Grade 1 retrolisthesis at C3-4 and C4-5.

Skull base and vertebrae: No acute fracture. No primary bone lesion
or focal pathologic process.

Soft tissues and spinal canal: No prevertebral fluid or swelling. No
visible canal hematoma.

Disc levels:

C2-3: Spondylosis without high-grade spinal canal or neural
foraminal stenosis.

C3-4: Large uncovertebral osteophytes with severe bilateral
foraminal stenosis. Moderate spinal canal stenosis.

C4-5: Uncovertebral osteophytes with severe bilateral foraminal
stenosis. Moderate spinal canal stenosis.

C5-6: Disc space narrowing with small uncovertebral osteophytes. No
spinal canal stenosis. Mild foraminal stenosis bilaterally.

C6-7: No spinal canal or neural foraminal stenosis.

Upper chest: Negative.

Other: None.
IMPRESSION: 1. Moderate spinal canal stenosis and severe bilateral foraminal
stenosis at C3-4 and C4-5.
2. No acute fracture or static subluxation of the cervical spine.

## 2020-03-20 IMAGING — MR MR CERVICAL SPINE W/O CM
3 series · 19 of 48 positions shown · non-contrast
Comparison: None.

CLINICAL DATA: Cervical radiculopathy rule out infection

EXAM:
MRI CERVICAL SPINE WITHOUT CONTRAST
TECHNIQUE: Multiplanar, multisequence MR imaging of the cervical spine was
performed. No intravenous contrast was administered.

[Series 2: T2 · sagittal · 3.0mm · 0.37mm/px · 11 of 15 slices shown (1 of 2)]
[im 1/15]
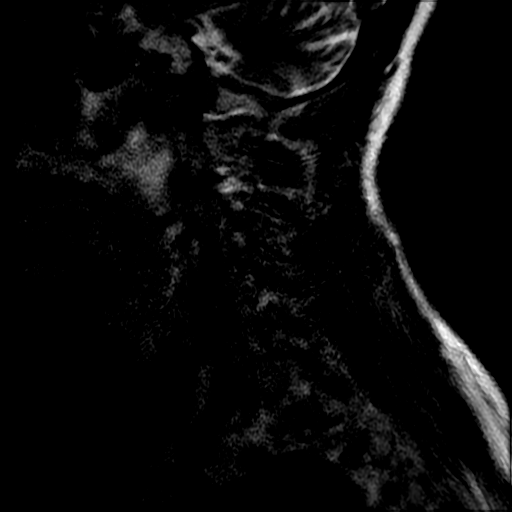
[im 2/15]
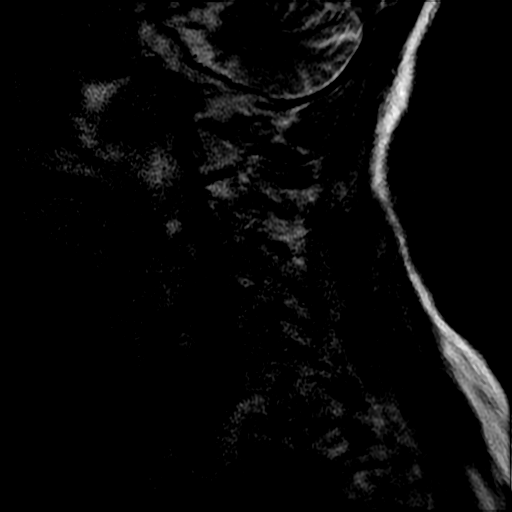
[im 3/15]
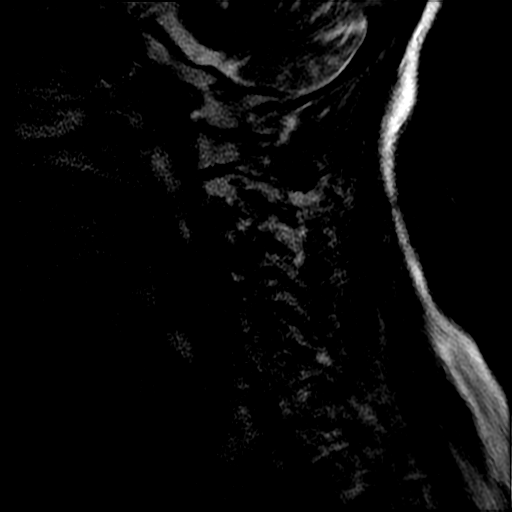
[im 5/15]
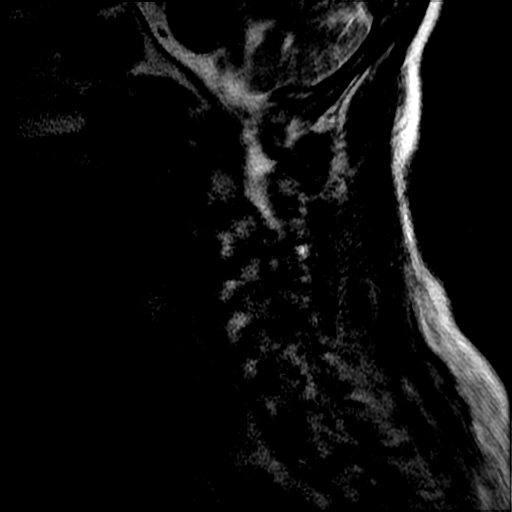
[im 6/15]
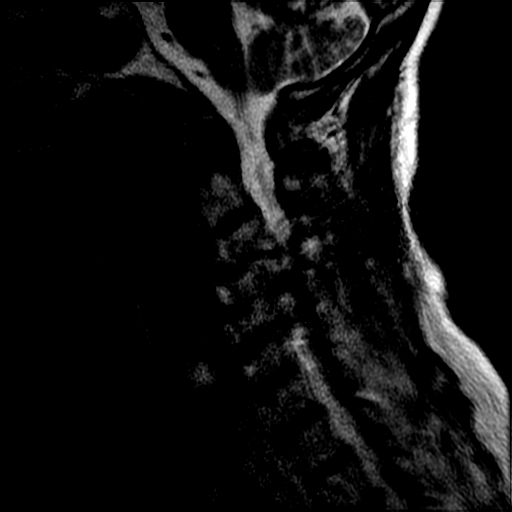
[im 8/15]
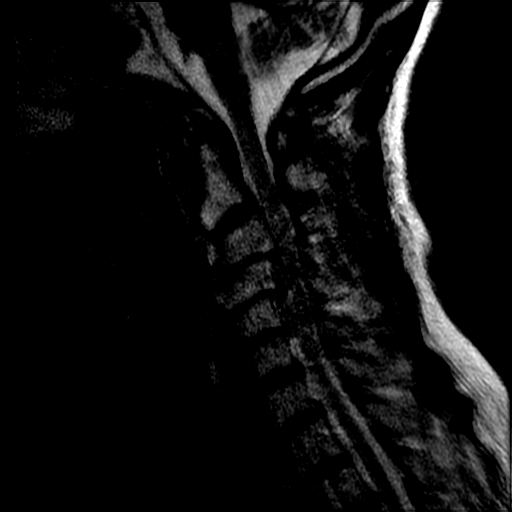
[im 9/15]
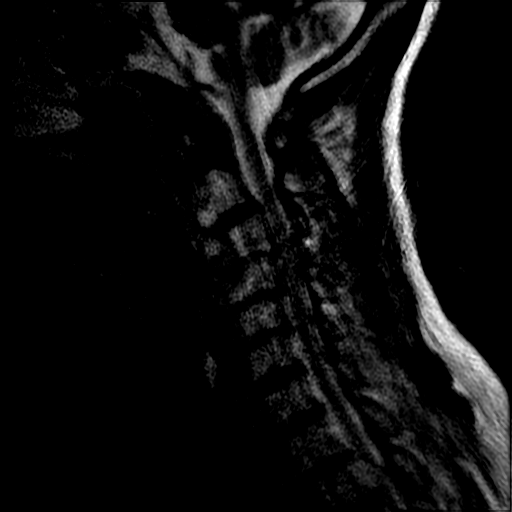
[im 10/15]
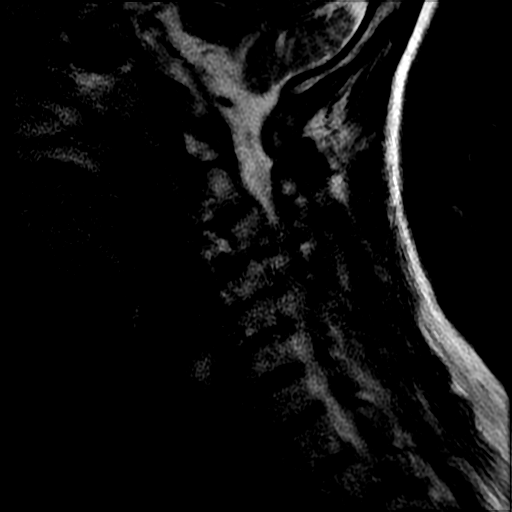
[im 12/15]
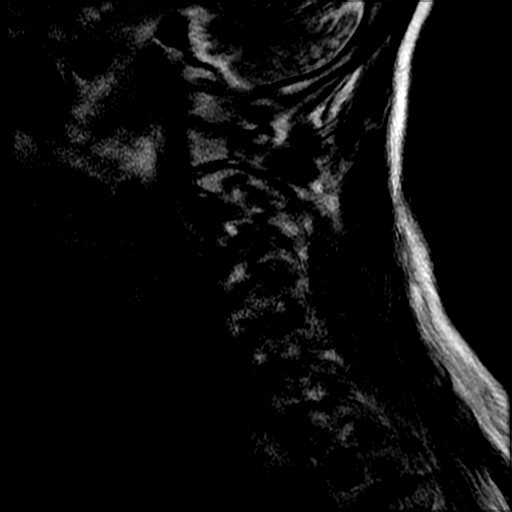
[im 13/15]
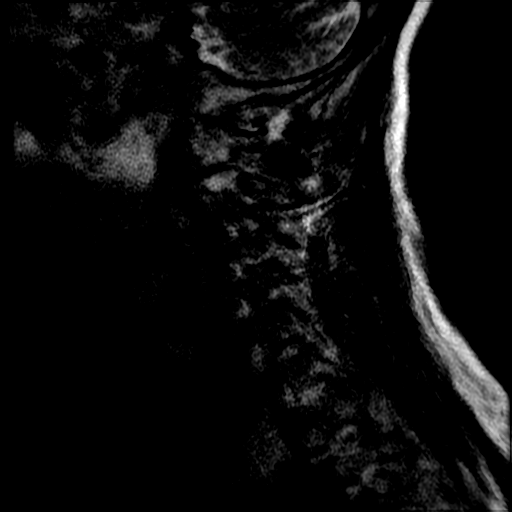
[im 15/15]
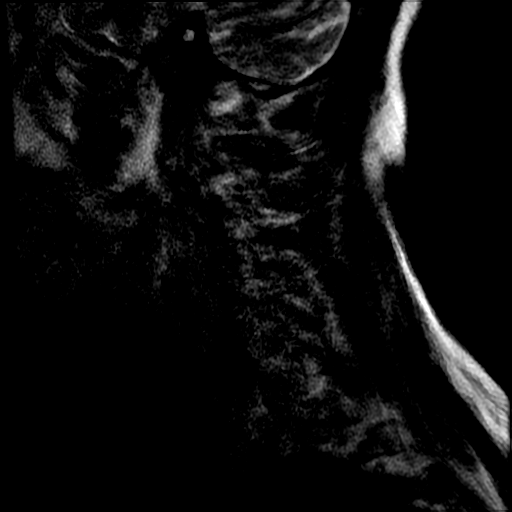

[Series 3: T1 · sagittal · 3.0mm · 0.37mm/px · 3 of 15 slices shown]
[im 2/15]
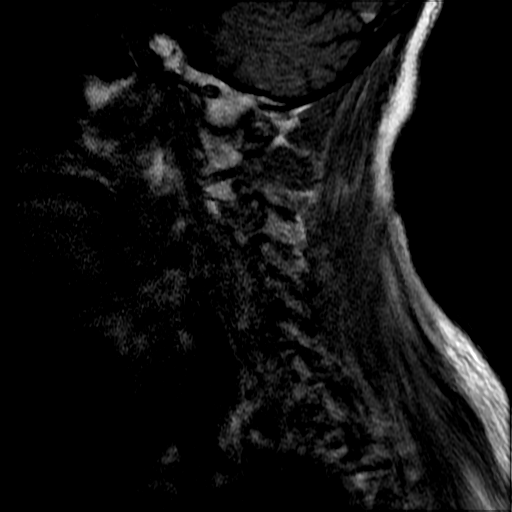
[im 8/15]
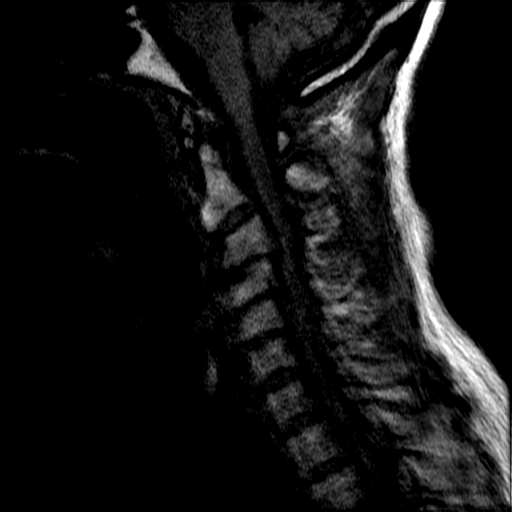
[im 13/15]
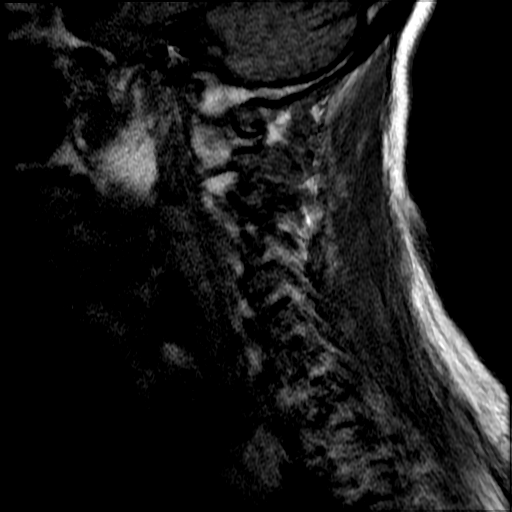

[Series 4: T2 · axial · 3.0mm · 0.39mm/px · z∈[-59,+51]mm · 5 of 40 slices shown (2 of 2)]
[im 2/40]
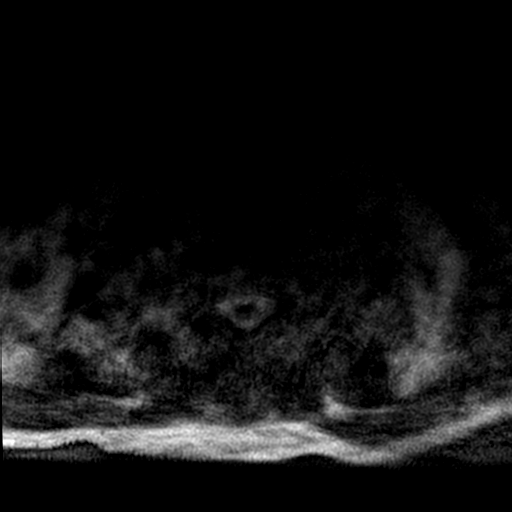
[im 7/40]
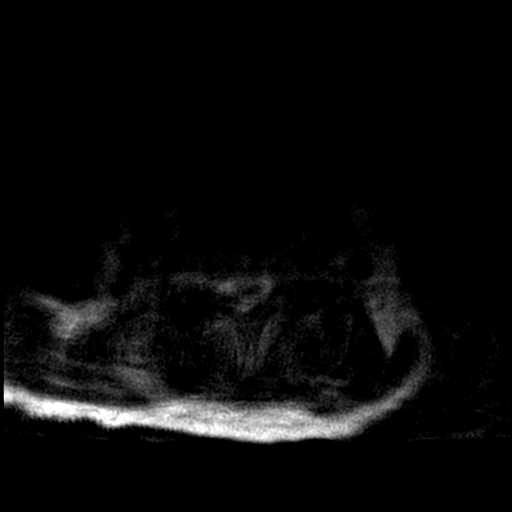
[im 8/40]
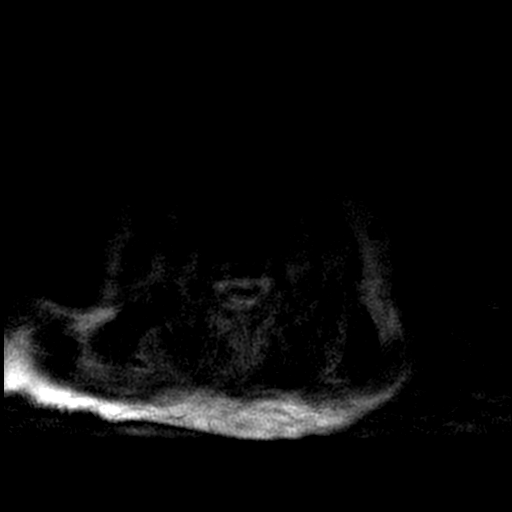
[im 20/40]
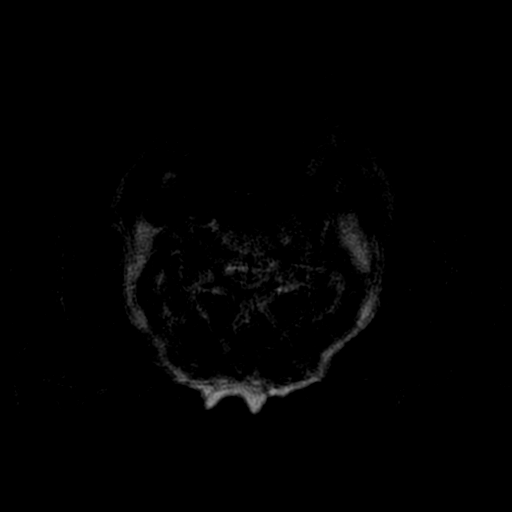
[im 34/40]
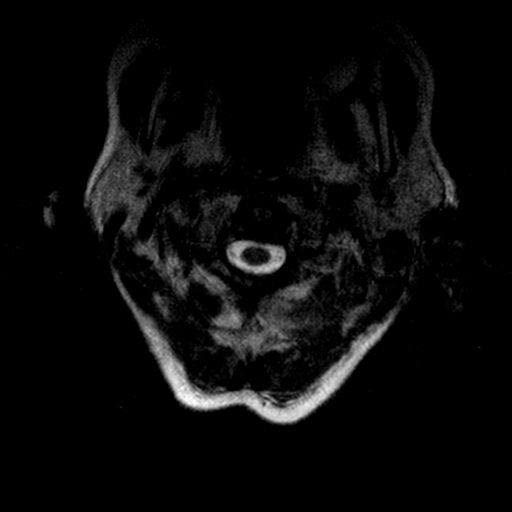

[19 of 48 positions shown; findings below may reference images not displayed]

FINDINGS: Alignment: Image quality degraded by extensive motion. The patient
was not able to complete the study or hold still.

3 mm retrolisthesis C3-4 and C4-5.  Mild retrolisthesis C5-6.

Vertebrae: Negative for fracture or mass. No bone marrow edema is
present

Cord: Cord evaluation limited by extensive motion.

Posterior Fossa, vertebral arteries, paraspinal tissues: No soft
tissue mass or fluid collection

Disc levels:

C2-3: Mild disc and facet degeneration.  Mild spinal stenosis

C3-4: Disc degeneration with posterior spurring. Moderate spinal
stenosis and moderate to severe foraminal encroachment bilaterally

C4-5: Disc degeneration and spurring. Moderate spinal stenosis.
Severe foraminal stenosis bilaterally

C5-6: Disc degeneration and spurring. Mild spinal stenosis and
moderate to severe foraminal stenosis bilaterally

C6-7: Mild degenerative change.  Negative for stenosis

C7-T1: Mild degenerative change.  Negative for stenosis.
IMPRESSION: Limited study. The patient was not able to hold still or complete
the study.

Multilevel spondylosis causing significant spinal and foraminal
stenosis at multiple levels. Recommend CT cervical spine for further
evaluation.

## 2020-03-20 MED ORDER — FREE WATER
100.0000 mL | Status: DC
  Administered 2020-03-20 – 2020-04-05 (×63): 100 mL via ORAL

## 2020-03-20 MED ORDER — POTASSIUM & SODIUM PHOSPHATES 280-160-250 MG PO PACK
1.0000 | PACK | Freq: Three times a day (TID) | ORAL | Status: AC
  Administered 2020-03-20 – 2020-03-21 (×3): 1 via ORAL
  Filled 2020-03-20 (×3): qty 1

## 2020-03-20 MED ORDER — POTASSIUM CHLORIDE CRYS ER 20 MEQ PO TBCR
40.0000 meq | EXTENDED_RELEASE_TABLET | Freq: Once | ORAL | Status: AC
  Administered 2020-03-20: 40 meq via ORAL
  Filled 2020-03-20: qty 2

## 2020-03-20 MED ORDER — ENSURE ENLIVE PO LIQD
237.0000 mL | Freq: Three times a day (TID) | ORAL | Status: DC
  Administered 2020-03-20 – 2020-03-26 (×15): 237 mL via ORAL
  Filled 2020-03-20: qty 237

## 2020-03-20 MED ORDER — ADULT MULTIVITAMIN W/MINERALS CH
1.0000 | ORAL_TABLET | Freq: Every day | ORAL | Status: DC
  Administered 2020-03-20 – 2020-04-08 (×18): 1 via ORAL
  Filled 2020-03-20 (×20): qty 1

## 2020-03-20 NOTE — Progress Notes (Addendum)
PROGRESS NOTE   [name] [name]  RKY:[id number]    DOB: 07/20/1945    DOA: 03/16/2020  PCP: Hoyt Koch, MD   I have briefly reviewed patients previous medical records in [facility]    Brief Narrative:  74 year old female with history of type 2 diabetes mellitus, chronic kidney disease stage IIIa, CHF, chronic lymphedema, hypertension, osteoarthritis who was brought to the ED by EMS.  Daughter reported that she had not been able to get in touch with her mom for a couple of days so she called the police to do a safety check on her and found her laying on the floor covered with stool and bugs crawling all over her.  Per chart review, it appears that she was recently seen by her PCP for intractable nausea and vomiting for 4 to 6 weeks associated with profound weakness, was also being treated for UTI, also seen in the ED on 1 such occasion, had a CT abdomen and pelvis 11/19 which was unremarkable.  Labs notable for hyponatremia, creatinine of 3.4, mildly elevated CK, lactate 2.1, abnormal urinalysis and CT head noted age-related atrophy.  She was admitted for acute metabolic encephalopathy.   Assessment & Plan:  Principal Problem:   UTI (urinary tract infection) Active Problems:   HTN (hypertension)   DM type 2 (diabetes mellitus, type 2) (HCC)   Sepsis ([place])   Hypothermia   Acute kidney injury superimposed on chronic kidney disease ([place])   Delirium   Protein-calorie malnutrition, severe   Acute metabolic encephalopathy:  Suspected to be multifactorial from AKI, dehydration, hypernatremia and Wernicke's encephalopathy.  Urine culture with only 20 K colonies of Enterococcus, doubt that this is contributing but completed UTI treatment with 3 days of IV ceftriaxone from 12/11-12/13 followed by a dose of fosfomycin on 12/14.  CT head, ammonia level, TSH and a.m. cortisol were normal  MRI brain ordered due to slurred and extremely limited speech.  MRI brain from 12/13:  Diffusion weighted and T2/flair hyperintense signal abnormality within the medial thalami, periaqueductal gray matter and dorsal medulla.  These findings are highly suggestive of Wernicke's encephalopathy.  Vitamin B1 level pending  Neurology was consulted and patient was started on high-dose IV thiamine.  As per neurology follow-up today, patient is responding well to thiamine, her exam has improved compared to yesterday.  They recommend continuing thiamine, even at discharge.  PT and OT recommend SNF at discharge  Neurology had requested MRI of the C-spine: Limited study due to lack of patient cooperation.  Multilevel spondylosis causing significant spinal and foraminal stenosis at multiple levels.  They recommend CT cervical spine for further evaluation, will await neurology input.   Chronic nausea and vomiting:  Etiology unclear.  Recent CT abdomen and pelvis on 11/19 was unremarkable.  No culprit medications noted.  TSH and a.m. cortisol are normal.  Does not seem to have recurrence but if she does, may consider GI consultation.  Otherwise outpatient follow-up.  Acute kidney injury complicating chronic kidney disease stage IIIa  Secondary to dehydration with ongoing ARB and Lasix use.  ARB and Lasix held.  IV fluids changed to D5 infusion after thiamine replacement, due to hypernatremia  Creatinine continues to steadily improve.  Trend daily BMP  Dehydration with hypernatremia  Management as indicated above  Continues to gradually improve.  Increase free water intake orally.  Hypokalemia/hypophosphatemia  Replace and follow.  Anemia  Unclear etiology.  Some of this may be dilutional.  Stable.  Follow CBC  Thrombocytopenia  Unclear etiology.  Follow CBC  Left hip pain with movement  X-ray suggest moderate osteoarthritis  Supportive/symptomatic treatment.  Type II DM  A1c 6.8, adequate control in this elderly female  Metformin currently on  hold.  Continue SSI.  Essential hypertension  Controlled off of antihypertensives  Holding losartan  Chronic lymphedema  Sacral decubitus ulcers  Secondary to moisture associated skin damage, partial-thickness wounds  Management per WOCN RN.  Body mass index is 35.87 kg/m.  Nutritional Status Nutrition Problem: Severe Malnutrition Etiology: social / environmental circumstances Signs/Symptoms: percent weight loss,mild fat depletion,moderate fat depletion,mild muscle depletion,moderate muscle depletion Percent weight loss: 6.4 % Interventions: Magic cup,MVI,Refer to RD note for recommendations    DVT prophylaxis: heparin injection 5,000 Units Start: 03/17/20 0600     Code Status: Full Code Family Communication: None at bedside Disposition:  Status is: Inpatient  Remains inpatient appropriate because:Inpatient level of care appropriate due to severity of illness   Dispo: The patient is from: Home              Anticipated d/c is to: SNF              Anticipated d/c date is: 2 days              Patient currently is not medically stable to d/c.        Consultants:   Neurology  Procedures:   None  Antimicrobials:    Anti-infectives (From admission, onward)   Start     Dose/Rate Route Frequency Ordered Stop   03/19/20 1300  fosfomycin (MONUROL) packet 3 g        3 g Oral  Once 03/19/20 1159 03/19/20 1415   03/18/20 0600  cefTRIAXone (ROCEPHIN) 1 g in sodium chloride 0.9 % 100 mL IVPB  Status:  Discontinued        1 g 200 mL/hr over 30 Minutes Intravenous Every 24 hours 03/17/20 0519 03/19/20 1159   03/17/20 0415  cefTRIAXone (ROCEPHIN) 1 g in sodium chloride 0.9 % 100 mL IVPB        1 g 200 mL/hr over 30 Minutes Intravenous  Once 03/17/20 0406 03/17/20 0448        Subjective:  Patient seen this morning.  Alert and oriented x3.  Stated that she was not sure why she was hospitalized and nobody had told her thus far.  I updated her regarding her diagnosis  and hospital care.  Stated that the food was too cold and hence did not eat much of her breakfast.  Objective:   Vitals:   03/19/20 1349 03/19/20 2045 03/20/20 0517 03/20/20 1459  BP: 112/78 (!) 111/99 100/71 (!) 112/91  Pulse: 79 (!) 103 86 (!) 107  Resp:  20 20 18   Temp: 97.9 F (36.6 C) (!) 97.5 F (36.4 C) 97.8 F (36.6 C) 97.8 F (36.6 C)  TempSrc: Oral Oral  Oral  SpO2: 95% 94% 100% 98%  Weight:      Height:        General exam: Pleasant elderly female, moderately built and obese lying comfortably supine in bed without distress.  Oral mucosa moist Respiratory system: Clear to auscultation. Respiratory effort normal. Cardiovascular system: S1 & S2 heard, RRR. No JVD, murmurs, rubs, gallops or clicks. No pedal edema. Gastrointestinal system: Abdomen is nondistended, soft and nontender. No organomegaly or masses felt. Normal bowel sounds heard. Central nervous system: Alert and oriented x3. No focal neurological deficits.  Horizontal nystagmus noted. Extremities: Symmetric 5 x 5  power. Skin: Chronic lower extremity skin changes from lymphedema Psychiatry: Judgement and insight appear to be somewhat impaired. Mood & affect appropriate.     Data Reviewed:   I have personally reviewed following labs and imaging studies   CBC: Recent Labs  Lab 03/17/20 0002 03/17/20 0520 03/18/20 0414 03/19/20 0127 03/20/20 0338  WBC 8.4   < > 6.6 6.0 5.5  NEUTROABS 6.9  --   --   --   --   HGB 13.5   < > 11.2* 10.9* 10.8*  HCT 44.1   < > 34.0* 35.8* 33.9*  MCV 80.5   < > 78.3* 80.1 79.8*  PLT 231   < > 189 165 131*   < > = values in this interval not displayed.    Basic Metabolic Panel: Recent Labs  Lab 03/19/20 0127 03/19/20 1506 03/20/20 0338  NA 151* 153* 150*  K 3.5 3.3* 3.3*  CL 112* 111 111  CO2 25 25 27   GLUCOSE 100* 126* 114*  BUN 62* 58* 53*  CREATININE 1.84* 1.64* 1.59*  CALCIUM 8.8* 9.0 9.0  MG  --  1.8  --   PHOS  --   --  1.8*    Liver Function  Tests: Recent Labs  Lab 03/17/20 0002 03/18/20 0414 03/19/20 0127  AST 63* 40 34  ALT 49* 33 30  ALKPHOS 51 46 46  BILITOT 0.9 0.9 0.8  PROT 7.4 6.2* 5.9*  ALBUMIN 3.3* 2.5* 2.4*    CBG: Recent Labs  Lab 03/20/20 0750 03/20/20 1209 03/20/20 1700  GLUCAP 86 162* 136*    Microbiology Studies:   Recent Results (from the past 240 hour(s))  Resp Panel by RT-PCR (Flu A&B, Covid) Nasopharyngeal Swab     Status: None   Collection Time: 03/16/20 11:13 PM   Specimen: Nasopharyngeal Swab; Nasopharyngeal(NP) swabs in vial transport medium  Result Value Ref Range Status   SARS Coronavirus 2 by RT PCR NEGATIVE NEGATIVE Final    Comment: (NOTE) SARS-CoV-2 target nucleic acids are NOT DETECTED.  The SARS-CoV-2 RNA is generally detectable in upper respiratory specimens during the acute phase of infection. The lowest concentration of SARS-CoV-2 viral copies this assay can detect is 138 copies/mL. A negative result does not preclude SARS-Cov-2 infection and should not be used as the sole basis for treatment or other patient management decisions. A negative result may occur with  improper specimen collection/handling, submission of specimen other than nasopharyngeal swab, presence of viral mutation(s) within the areas targeted by this assay, and inadequate number of viral copies(<138 copies/mL). A negative result must be combined with clinical observations, patient history, and epidemiological information. The expected result is Negative.  Fact Sheet for Patients:  EntrepreneurPulse.com.au  Fact Sheet for Healthcare Providers:  IncredibleEmployment.be  This test is no t yet approved or cleared by the [place] FDA and  has been authorized for detection and/or diagnosis of SARS-CoV-2 by FDA under an Emergency Use Authorization (EUA). This EUA will remain  in effect (meaning this test can be used) for the duration of the COVID-19 declaration  under Section 564(b)(1) of the Act, 21 U.S.C.section 360bbb-3(b)(1), unless the authorization is terminated  or revoked sooner.       Influenza A by PCR NEGATIVE NEGATIVE Final   Influenza B by PCR NEGATIVE NEGATIVE Final    Comment: (NOTE) The Xpert Xpress SARS-CoV-2/FLU/RSV plus assay is intended as an aid in the diagnosis of influenza from Nasopharyngeal swab specimens and should not be used as a  sole basis for treatment. Nasal washings and aspirates are unacceptable for Xpert Xpress SARS-CoV-2/FLU/RSV testing.  Fact Sheet for Patients: EntrepreneurPulse.com.au  Fact Sheet for Healthcare Providers: IncredibleEmployment.be  This test is not yet approved or cleared by the [place] FDA and has been authorized for detection and/or diagnosis of SARS-CoV-2 by FDA under an Emergency Use Authorization (EUA). This EUA will remain in effect (meaning this test can be used) for the duration of the COVID-19 declaration under Section 564(b)(1) of the Act, 21 U.S.C. section 360bbb-3(b)(1), unless the authorization is terminated or revoked.  Performed at [facility], [place] [address], [place], [place] [address]   Blood culture (routine x 2)     Status: None (Preliminary result)   Collection Time: 03/17/20  1:04 AM   Specimen: BLOOD  Result Value Ref Range Status   Specimen Description BLOOD RIGHT ANTECUBITAL  Final   Special Requests   Final    BOTTLES DRAWN AEROBIC AND ANAEROBIC Blood Culture results may not be optimal due to an inadequate volume of blood received in culture bottles   Culture   Final    NO GROWTH 3 DAYS Performed at [facility], [place] [address] [place], [place] [address]    Report Status PENDING  Incomplete  Blood culture (routine x 2)     Status: None (Preliminary result)   Collection Time: 03/17/20  2:07 AM   Specimen: BLOOD RIGHT HAND  Result Value Ref Range Status   Specimen Description BLOOD RIGHT HAND   Final   Special Requests   Final    BOTTLES DRAWN AEROBIC AND ANAEROBIC Blood Culture results may not be optimal due to an inadequate volume of blood received in culture bottles   Culture   Final    NO GROWTH 3 DAYS Performed at [facility], [place] [address] [place], [place] [address]    Report Status PENDING  Incomplete  Urine culture     Status: Abnormal   Collection Time: 03/17/20 11:50 AM   Specimen: Urine, Random  Result Value Ref Range Status   Specimen Description URINE, RANDOM  Final   Special Requests   Final    NONE Performed at [facility], [place] [address], [place], [place] [address]    Culture 20,000 COLONIES/mL ENTEROCOCCUS FAECALIS (A)  Final   Report Status 03/19/2020 FINAL  Final   Organism ID, Bacteria ENTEROCOCCUS FAECALIS (A)  Final      Susceptibility   Enterococcus faecalis - MIC*    AMPICILLIN <=2 SENSITIVE Sensitive     NITROFURANTOIN <=16 SENSITIVE Sensitive     VANCOMYCIN 2 SENSITIVE Sensitive     * 20,000 COLONIES/mL ENTEROCOCCUS FAECALIS     Radiology Studies:  MR CERVICAL SPINE WO CONTRAST  Result Date: 03/20/2020 CLINICAL DATA:  Cervical radiculopathy rule out infection EXAM: MRI CERVICAL SPINE WITHOUT CONTRAST TECHNIQUE: Multiplanar, multisequence MR imaging of the cervical spine was performed. No intravenous contrast was administered. COMPARISON:  None. FINDINGS: Alignment: Image quality degraded by extensive motion. The patient was not able to complete the study or hold still. 3 mm retrolisthesis C3-4 and C4-5.  Mild retrolisthesis C5-6. Vertebrae: Negative for fracture or mass. No bone marrow edema is present Cord: Cord evaluation limited by extensive motion. Posterior Fossa, vertebral arteries, paraspinal tissues: No soft tissue mass or fluid collection Disc levels: C2-3: Mild disc and facet degeneration.  Mild spinal stenosis C3-4: Disc degeneration with posterior spurring. Moderate spinal stenosis and moderate to severe foraminal  encroachment bilaterally C4-5: Disc degeneration and spurring. Moderate spinal stenosis. Severe foraminal stenosis bilaterally C5-6: Disc degeneration and spurring. Mild spinal stenosis and moderate to severe foraminal stenosis bilaterally C6-7: Mild degenerative change.  Negative for stenosis C7-T1: Mild degenerative change.  Negative for stenosis. IMPRESSION: Limited study. The patient was not able to hold still or complete the study. Multilevel spondylosis causing significant spinal and foraminal stenosis at multiple levels. Recommend CT cervical spine for further evaluation. Electronically Signed   By: Franchot Gallo M.D.   On: 03/20/2020 17:00     Scheduled Meds:   . feeding supplement  237 mL Oral TID BM  . heparin  5,000 Units Subcutaneous Q8H  . insulin aspart  0-5 Units Subcutaneous QHS  . insulin aspart  0-9 Units Subcutaneous TID WC  . multivitamin with minerals  1 tablet Oral Daily    Continuous Infusions:   . sodium chloride 75 mL/hr at 03/20/20 0513  . [START ON 03/23/2020] thiamine injection    . thiamine injection 500 mg (03/20/20 1452)     LOS: 3 days     Vernell Leep, MD, [place], [facility]. [facility]    To contact the attending provider between 7A-7P or the covering provider during after hours 7P-7A, please log into the web site [email or web address] and access using universal  password for that web site. If you do not have the password, please call the hospital operator.  03/20/2020, 5:58 PM

## 2020-03-20 NOTE — Progress Notes (Signed)
Initial Nutrition Assessment  DOCUMENTATION CODES:   Severe malnutrition in context of social or environmental circumstances  INTERVENTION:   -Continue Magic cup TID with meals, each supplement provides 290 kcal and 9 grams of protein -Ensure Enlive po TID, each supplement provides 350 kcal and 20 grams of protein -MVI with minerals daily -Feeding assistance with meals  NUTRITION DIAGNOSIS:   Severe Malnutrition related to social / environmental circumstances as evidenced by percent weight loss,mild fat depletion,moderate fat depletion,mild muscle depletion,moderate muscle depletion.  GOAL:   Patient will meet greater than or equal to 90% of their needs  MONITOR:   PO intake,Supplement acceptance,Labs,Weight trends,Skin,I & O's  REASON FOR ASSESSMENT:   Malnutrition Screening Tool    ASSESSMENT:   [name] [name] is a 74 y.o. female with medical history significant for DMT2, CKD, CHF, HTN, OA who presents by EMS. Her daughter had not been able to get in touch with Ms. Frazer for past two days and she called the police to do a safety check on her.  Found by police sitting on the floor, leaning forward in the central living space with bugs crawling on her. Patient was next to her walker sitting in her soiled clothes with excrement on the floor around her. Patient reportedly denied pain including chest pain, back pain, abdominal pain and SOB. Pt is now sleeping on gurney in ER and does not respond to questions or follow commands. ER provider reported that pt was initially confused when she arrived. Per chart review, patient seen on 03/08/20 by her PCP for intractable N/V x 4-6 weeks with profound weakness, lightheadedness.  Reviewed I/O's: +664 ml x 24 hours and +3.5 L since admission  UOP: 500 ml x 24 hours  Per neurology notes, pt placed on thiamine infusion on 03/19/20, due to Wernicke's encephalopathy. Pt is more alert today.   Spoke with pt at bedside, who was much more alert  and talkative compared to prior visit. Pt shares that she is tolerating current diet texture well, but needs assistance with feeding due to unsteady hands (pt can hold a cup but has difficulty holding utensils). Noted meal completion 0-50%. Observed breakfast meal tray- pt consumed 10% of oatmeal and about 40% of french toast and sausage patty.   Pt shares poor appetite over the past year. Pt reports she would often have vomiting when eating and gradually started losing weight because of it "but no one seemed to care when I brought it up".   Discussed with pt importance of good meal and supplement intake to promote healing. Pt amenable to Ensure supplements.   Medications reviewed and include 0.45% sodium chloride infusion @ 75 ml/hr and IV thiamine.   Pt with poor oral intake and would benefit from nutrient dense supplement. One Ensure Enlive supplement provides 350 kcals, 20 grams protein, and 44-45 grams of carbohydrate vs one Glucerna shake supplement, which provides 220 kcals, 10 grams of protein, and 26 grams of carbohydrate. Given pt's hx of DM, RD will reassess adequacy of PO intake, CBGS, and adjust supplement regimen as appropriate at follow-up.   Labs reviewed: Na: 150, K: 3.3, CBGS: 124-137 (inpatient orders for glycemic control are 0-9 units insulin aspart TID with meals and 0-5 units insulin aspart daily at bedtime).   Diet Order:   Diet Order            Diet heart healthy/carb modified Room service appropriate? Yes; Fluid consistency: Thin  Diet effective now  EDUCATION NEEDS:   Not appropriate for education at this time  Skin:  Skin Assessment: Skin Integrity Issues: Skin Integrity Issues:: Other (Comment) Other: bilateral leg venous stasis ulcers  Last BM:  Unknown  Height:   Ht Readings from Last 1 Encounters:  03/18/20 5\' 10"  (1.778 m)    Weight:   Wt Readings from Last 1 Encounters:  03/19/20 113.4 kg    Ideal Body Weight:  72.7  kg  BMI:  Body mass index is 35.87 kg/m.  Estimated Nutritional Needs:   Kcal:  2200-2400  Protein:  130-145 grams  Fluid:  > 2 L    Loistine Chance, RD, LDN, [place] Registered Dietitian II Certified Diabetes Care and Education Specialist Please refer to [facility] for RD and/or RD on-call/weekend/after hours pager

## 2020-03-20 NOTE — Progress Notes (Signed)
Pharmacy Consult - Phosphorus repletion  34 yof admitted with AMS. Pharmacy consulted to replete low phosphorus, 1.8 today. K also low at 3.3 (KCl 98mEq PO x 1 already ordered). Last Mag was 1.8 yesterday.  Plan: Give PhosNak 1 packet TID x 3 doses Recheck bmet/mag/phos in AM   Arturo Morton, PharmD, BCPS Please check AMION for all [facility] contact numbers Clinical Pharmacist 03/20/2020 6:30 PM

## 2020-03-20 NOTE — Progress Notes (Signed)
Neurology Brief Progress Note  S: I am talking better today.    O: Current vital signs: BP 100/71 (BP Location: Left Arm)   Pulse 86   Temp 97.8 F (36.6 C)   Resp 20   Ht 5\' 10"  (1.778 m)   Wt 113.4 kg   SpO2 100%   BMI 35.87 kg/m  Vital signs in last 24 hours: Temp:  [97.5 F (36.4 C)-97.9 F (36.6 C)] 97.8 F (36.6 C) (12/15 0517) Pulse Rate:  [79-103] 86 (12/15 0517) Resp:  [20] 20 (12/15 0517) BP: (100-112)/(71-99) 100/71 (12/15 0517) SpO2:  [94 %-100 %] 100 % (12/15 0517) Weight:  [113.4 kg] 113.4 kg (12/14 1100)  GENERAL: Awake, alert in NAD HEENT: Normocephalic and atraumatic LUNGS: Normal respiratory effort.  CV: RRR Ext: warm. BLE with edema   NEURO:  Mental Status: AA&Ox3  Speech/Language:  Naming, repetition, fluency, and comprehension intact. She answers questions immediately today. No hesitation.  Cranial Nerves:  II: PERRL.  III, IV, VI: EOMI. Nystagmus noted with beats to right and left with eye movement horizontally.  Eyelids elevate symmetrically.  V: Sensation is intact to light touch to face.  VII: Smile is symmetrical.  VIII: hearing intact to voice. IX, X:Phonation is normal.  NO:[id number] shrug 5/5. XII: tongue is midline without fasciculations. Motor: 5/5 strength to UEs.  Tone: is normal to UEs Sensation- Intact to light touch bilaterally. Coordination:  No drift.   Medications  Current Facility-Administered Medications:  .  0.45 % sodium chloride infusion, , Intravenous, Continuous, Kirby-Graham, Karsten Fells, NP, Last Rate: 75 mL/hr at 03/20/20 0513, New Bag at 03/20/20 0513 .  acetaminophen (TYLENOL) tablet 650 mg, 650 mg, Oral, Q6H PRN **OR** acetaminophen (TYLENOL) suppository 650 mg, 650 mg, Rectal, Q6H PRN, Chotiner, Yevonne Aline, MD .  heparin injection 5,000 Units, 5,000 Units, Subcutaneous, Q8H, Chotiner, Yevonne Aline, MD, 5,000 Units at 03/20/20 [phone] .  HYDROmorphone (DILAUDID) injection 0.5 mg, 0.5 mg, Intravenous, Q4H PRN, Domenic Polite, MD, 0.5 mg at 03/18/20 1841 .  insulin aspart (novoLOG) injection 0-5 Units, 0-5 Units, Subcutaneous, QHS, Chotiner, Yevonne Aline, MD .  insulin aspart (novoLOG) injection 0-9 Units, 0-9 Units, Subcutaneous, TID WC, Chotiner, Yevonne Aline, MD, 1 Units at 03/19/20 1759 .  ondansetron (ZOFRAN) injection 4 mg, 4 mg, Intravenous, Q6H PRN, Domenic Polite, MD .  Derrill Memo ON 03/23/2020] thiamine (B-1) 250 mg in sodium chloride 0.9 % 50 mL IVPB, 250 mg, Intravenous, Daily, Kirby-Graham, Karsten Fells, NP .  thiamine 500mg  in normal saline (55ml) IVPB, 500 mg, Intravenous, Q8H, Kirby-Graham, Karsten Fells, NP, Last Rate: 100 mL/hr at 03/20/20 0513, 500 mg at 03/20/20 0513  Labs Vit B1 still pending.   Imaging-MRI Cspine is pending.    74 yo female who developed new onset of speech difficulties yesterday found to have Wernicke's encephalopathy. Is responding well to Thiamine as her exam has improved since yesterday.    Recommendations: -Continue Thiamine as ordered. On discharge, send pt home on Thiamine 100mg  po qd.  -Neuro will f/up MRI cspine. -Neuro will be available for questions as needed.   Pt seen by Clance Boll, MSN, APN-BC/Nurse Practitioner/Neuro  Pager: [phone]

## 2020-03-21 DIAGNOSIS — E870 Hyperosmolality and hypernatremia: Secondary | ICD-10-CM

## 2020-03-21 LAB — CBC
HCT: 29.6 % — ABNORMAL LOW (ref 36.0–46.0)
Hemoglobin: 9.5 g/dL — ABNORMAL LOW (ref 12.0–15.0)
MCH: 25.5 pg — ABNORMAL LOW (ref 26.0–34.0)
MCHC: 32.1 g/dL (ref 30.0–36.0)
MCV: 79.6 fL — ABNORMAL LOW (ref 80.0–100.0)
Platelets: 129 10*3/uL — ABNORMAL LOW (ref 150–400)
RBC: 3.72 MIL/uL — ABNORMAL LOW (ref 3.87–5.11)
RDW: 20.1 % — ABNORMAL HIGH (ref 11.5–15.5)
WBC: 4.9 10*3/uL (ref 4.0–10.5)
nRBC: 1.8 % — ABNORMAL HIGH (ref 0.0–0.2)

## 2020-03-21 LAB — BASIC METABOLIC PANEL
Anion gap: 11 (ref 5–15)
BUN: 40 mg/dL — ABNORMAL HIGH (ref 8–23)
CO2: 24 mmol/L (ref 22–32)
Calcium: 8.8 mg/dL — ABNORMAL LOW (ref 8.9–10.3)
Chloride: 114 mmol/L — ABNORMAL HIGH (ref 98–111)
Creatinine, Ser: 1.47 mg/dL — ABNORMAL HIGH (ref 0.44–1.00)
GFR, Estimated: 37 mL/min — ABNORMAL LOW (ref >60)
Glucose, Bld: 106 mg/dL — ABNORMAL HIGH (ref 70–99)
Potassium: 3.2 mmol/L — ABNORMAL LOW (ref 3.5–5.1)
Sodium: 149 mmol/L — ABNORMAL HIGH (ref 135–145)

## 2020-03-21 LAB — GLUCOSE, CAPILLARY
Glucose-Capillary: 88 mg/dL (ref 70–99)
Glucose-Capillary: 122 mg/dL — ABNORMAL HIGH (ref 70–99)
Glucose-Capillary: 150 mg/dL — ABNORMAL HIGH (ref 70–99)
Glucose-Capillary: 121 mg/dL — ABNORMAL HIGH (ref 70–99)

## 2020-03-21 LAB — MAGNESIUM: Magnesium: 1.6 mg/dL — ABNORMAL LOW (ref 1.7–2.4)

## 2020-03-21 LAB — PHOSPHORUS: Phosphorus: 1.9 mg/dL — ABNORMAL LOW (ref 2.5–4.6)

## 2020-03-21 MED ORDER — MAGNESIUM SULFATE 2 GM/50ML IV SOLN
2.0000 g | Freq: Once | INTRAVENOUS | Status: AC
  Administered 2020-03-21: 2 g via INTRAVENOUS
  Filled 2020-03-21: qty 50

## 2020-03-21 MED ORDER — POTASSIUM PHOSPHATES 15 MMOLE/5ML IV SOLN
30.0000 mmol | Freq: Once | INTRAVENOUS | Status: AC
  Administered 2020-03-21: 30 mmol via INTRAVENOUS
  Filled 2020-03-21: qty 10

## 2020-03-21 MED ORDER — POTASSIUM CHLORIDE CRYS ER 20 MEQ PO TBCR
30.0000 meq | EXTENDED_RELEASE_TABLET | ORAL | Status: AC
  Administered 2020-03-21 (×2): 20 meq via ORAL
  Filled 2020-03-21 (×2): qty 1

## 2020-03-21 NOTE — Evaluation (Signed)
Clinical/Bedside Swallow Evaluation Patient Details  Name: [name] [name] MRN: [id number] Date of Birth: Jan 01, 1946  Today's Date: 03/21/2020 Time: SLP Start Time (ACUTE ONLY): 5366 SLP Stop Time (ACUTE ONLY): 1442 SLP Time Calculation (min) (ACUTE ONLY): 19.17 min  Past Medical History:  Past Medical History:  Diagnosis Date  . Acute kidney injury ([place])   . Aortic stenosis, mild 11/17/2013  . Arthritis    "both knees, spine, back, fingers" (11/29/2013)  . CHF (congestive heart failure) ([place])   . Edema   . GERD (gastroesophageal reflux disease)   . Heart murmur   . History of diastolic dysfunction    Echo 07/4032 with diastolic dysfunction  . HTN (hypertension)   . Morbid obesity ([place])   . OA (osteoarthritis)   . PAC (premature atrial contraction)    per prior Holter  . Palpitations   . Pneumonia    "as a child"  . PVC's (premature ventricular contractions)    per prior Holter  . SVT (supraventricular tachycardia) ([place])   . Tachycardia    noted at 07/28/11 visit. Started on beta blocker. Possible atrial flutter vs long PR tachycardia/AVNRT  . Type II diabetes mellitus ([place])    Past Surgical History:  Past Surgical History:  Procedure Laterality Date  . CESAREAN SECTION  1985  . CESAREAN SECTION  1985  . SUPRAVENTRICULAR TACHYCARDIA ABLATION  11/29/2013  . SUPRAVENTRICULAR TACHYCARDIA ABLATION N/A 11/29/2013   Procedure: SUPRAVENTRICULAR TACHYCARDIA ABLATION;  Surgeon: Evans Lance, MD;  Location: [facility] CATH LAB;  Service: Cardiovascular;  Laterality: N/A;  . US ECHOCARDIOGRAPHY  07/25/2008   EF 55-60%  . VAGINAL DELIVERY     HPI:  74 y.o. female admitted 03/16/20 found down by EMS after daughter requested wellness check having not heard from her for a couple days. Workup for metabolic encephalopathy, ARF, dehydration. Pt with L hip pain, xray with moderate osteoarthritis. Head CT negative for acute abnormality; age-related atrophy. MRI suggestive of Wernicke's  encephalopathy. PMH includes CKD3, CHF, DM2, HTN, OA, chronic lymphedema, venous stasis ulcers. A clinical swallow evaluation was completed on 12/14, with findings of functional swallow and recommendations for a regular diet and thin liquids. Per RD notes, pt shared that she has had a poor appetite for the last year; she would often have vomiting and started losing weight gradually.   RN now reports intermittent pocketing with food. Sept 2019 pt had EGD with Dr. Bryan Lemma due to recurrent vomiting: "A mild Schatzki ring was found in the lower third of the esophagus. A TTS dilator was  passed through the scope. Dilation with an 18-19-20 mm balloon dilator was performed to 20 mm. The upper third of the esophagus and middle third of the esophagus were normal."   Assessment / Plan / Recommendation Clinical Impression  Returned for reassessment after reports of oral holding and excessive mastication of solids.  Pt demonstrated prolonged and effortful chewing of graham crackers, with liquids needed to facilitate the swallow.  She winced intermittently while eating and reported that solids sometimes "get stuck," pointing to sternum.  Liquids were tolerated without s/s of aspiration.  After the assessment, while documenting outside her room, pt noted to be coughing - when asked, she stated "it was the crackers."   Recommend downgrading diet to dysphagia 2, thin liquids for now.  Given pt's hx of Schatzki's ring and dilation in 2019, she may benefit from a GI consult to determine if repeat dilation is indicated.  D/W RN.  SLP will follow for plan, further  pt education if warranted.      Aspiration Risk  Risk for inadequate nutrition/hydration    Diet Recommendation     Medication Administration: Whole meds with liquid    Other  Recommendations Recommended Consults: Consider GI evaluation Oral Care Recommendations: Oral care BID   Follow up Recommendations        Frequency and Duration min 2x/week  1  week       Prognosis Prognosis for Safe Diet Advancement: Good      Swallow Study   General HPI: 74 y.o. female admitted 03/16/20 found down by EMS after daughter requested wellness check having not heard from her for a couple days. Workup for metabolic encephalopathy, ARF, dehydration. Pt with L hip pain, xray with moderate osteoarthritis. Head CT negative for acute abnormality; age-related atrophy. MRI suggestive of Wernicke's encephalopathy. PMH includes CKD3, CHF, DM2, HTN, OA, chronic lymphedema, venous stasis ulcers. A clinical swallow evaluation was completed on 12/14, with findings of functional swallow and recommendations for a regular diet and thin liquids. Per RD notes, pt shared that she has had a poor appetite for the last year; she would often have vomiting and started losing weight gradually.   RN now reports intermittent pocketing with food. Sept 2019 pt had EGD with Dr. Bryan Lemma due to recurrent vomiting: "A mild Schatzki ring was found in the lower third of the esophagus. A TTS dilator was  passed through the scope. Dilation with an 18-19-20 mm balloon dilator was performed to 20 mm. The upper third of the esophagus and middle third of the esophagus were normal." Type of Study: Bedside Swallow Evaluation Previous Swallow Assessment: see HPI Diet Prior to this Study: Regular;Thin liquids Temperature Spikes Noted: No Respiratory Status: Room air History of Recent Intubation: No Behavior/Cognition: Confused;Requires cueing Oral Cavity Assessment: Within Functional Limits Oral Care Completed by SLP: No Oral Cavity - Dentition: Poor condition;Missing dentition Vision: Functional for self-feeding Self-Feeding Abilities: Able to feed self;Needs assist Patient Positioning: Upright in bed Baseline Vocal Quality: Normal Volitional Cough: Strong Volitional Swallow: Unable to elicit    Oral/Motor/Sensory Function     Ice Chips Ice chips: Within functional limits   Thin Liquid  Thin Liquid: Within functional limits Presentation: Cup;Straw    Nectar Thick Nectar Thick Liquid: Not tested   Honey Thick Honey Thick Liquid: Not tested   Puree Puree: Within functional limits   Solid     Solid: Impaired Oral Phase Impairments: Other (comment) (extremely prolonged mastication) Oral Phase Functional Implications: Prolonged oral transit Pharyngeal Phase Impairments: Cough - Delayed      Carmin Dibartolo Laurice 03/21/2020,2:53 PM   Marciano Mundt L. Tivis Ringer, [place] Office number [phone] Pager [phone]

## 2020-03-21 NOTE — Progress Notes (Signed)
Pharmacy Consult - Phosphorus repletion  80 yof admitted with AMS and AKI. Pharmacy consulted to replete low phosphorus  Phos 1.8 > 3 packets PO> 1.9 still low. K also low, was 3.3 > KCl 55mEq PO x 1 > 3.2 today. Mg 1.6 getting 2g IV today. Renal function back to baseline. Patient improving clinically on high dose IV thiamine.    Plan: Give IV Kphos 30 mmol over 6 hrs today  Recheck Bmet/mag/phos in AM   Benetta Spar, PharmD, [place], [facility] Clinical Pharmacist  Please check AMION for all [place] phone numbers After 10:00 PM, call [place] [phone]

## 2020-03-21 NOTE — Care Management Important Message (Signed)
Important Message  Patient Details  Name: [name] [name] MRN: [id number] Date of Birth: 1945/12/06   Medicare Important Message Given:  Yes     [name] 03/21/2020, 3:24 PM

## 2020-03-21 NOTE — Progress Notes (Signed)
PROGRESS NOTE   [name] [name]  KTG:[id number]    DOB: [birth year]    DOA: 03/16/2020  PCP: Hoyt Koch, MD   I have briefly reviewed patients previous medical records in [facility].    Brief Narrative:  74 year old female with history of type 2 diabetes mellitus, chronic kidney disease stage IIIa, CHF, chronic lymphedema, hypertension, osteoarthritis who was brought to the ED by EMS.  Daughter reported that she had not been able to get in touch with her mom for a couple of days so she called the police to do a safety check on her and found her laying on the floor covered with stool and bugs crawling all over her.  Per chart review, it appears that she was recently seen by her PCP for intractable nausea and vomiting for 4 to 6 weeks associated with profound weakness, was also being treated for UTI, also seen in the ED on 1 such occasion, had a CT abdomen and pelvis 11/19 which was unremarkable.  Labs notable for hyponatremia, creatinine of 3.4, mildly elevated CK, lactate 2.1, abnormal urinalysis and CT head noted age-related atrophy.  She was admitted for acute metabolic encephalopathy.  Improving.   Assessment & Plan:  Principal Problem:   UTI (urinary tract infection) Active Problems:   HTN (hypertension)   DM type 2 (diabetes mellitus, type 2) (HCC)   Sepsis ([place])   Hypothermia   Acute kidney injury superimposed on chronic kidney disease ([place])   Delirium   Protein-calorie malnutrition, severe   Acute metabolic encephalopathy:  Suspected to be multifactorial from AKI, dehydration, hypernatremia and Wernicke's encephalopathy.  Urine culture with only 20 K colonies of Enterococcus, doubt that this is contributing but completed UTI treatment with 3 days of IV ceftriaxone from 12/11-12/13 followed by a dose of fosfomycin on 12/14.  CT head, ammonia level, TSH and a.m. cortisol were normal  MRI brain ordered due to slurred and extremely limited speech.  MRI brain from  12/13: Diffusion weighted and T2/flair hyperintense signal abnormality within the medial thalami, periaqueductal gray matter and dorsal medulla.  These findings are highly suggestive of Wernicke's encephalopathy.  Vitamin B1 level pending  Neurology was consulted and patient was started on high-dose IV thiamine.  As per neurology follow-up 12/15, patient is responding well to thiamine, her exam has improved compared to day prior.  They recommend continuing thiamine, even at discharge.  PT and OT recommend SNF at discharge  Neurology had requested MRI of the C-spine: Limited study due to lack of patient cooperation.  Multilevel spondylosis causing significant spinal and foraminal stenosis at multiple levels.    CT C-spine shows moderate cervical spinal canal stenosis and severe bilateral foraminal stenosis at C3-4 and C4-5.  No acute fracture or static subluxation.  Improving.  Discussed with Dr. Curly Shores, neuro hospitalist on 12/16 who recommended considering IM thiamine at discharge to complete course.  Chronic nausea and vomiting:  Etiology unclear.  Recent CT abdomen and pelvis on 11/19 was unremarkable.  No culprit medications noted.  TSH and a.m. cortisol are normal.  Does not seem to have recurrence but if she does, may consider GI consultation.  Otherwise outpatient follow-up.  As per SLP evaluation, history of Schatzki's ring dilatation in 2019.  Will discuss with GI in a.m. regarding need for inpatient versus outpatient follow-up.  Dysphagia  SLP evaluated and placed on dysphagia 2 diet and thin liquids and suggested GI consultation as needed based on history above.  Acute kidney injury complicating chronic  kidney disease stage IIIa  Secondary to dehydration with ongoing ARB and Lasix use.  ARB and Lasix held.  IV fluids changed to half-normal saline infusion after thiamine replacement, due to hypernatremia  Creatinine continues to steadily improve.  Creatinine down  to 1.47  Trend daily BMP  Dehydration with hypernatremia  Management as indicated above  Continues to gradually improve.  Sodium down to 149 from 153 48 hours prior  Increase free water intake orally.  Hypokalemia/hypophosphatemia/hypomagnesemia  Replace and follow.  Anemia  Unclear etiology.  Some of this may be dilutional.  Hemoglobin dropped from 10.8-9.5,?  Dilutional.  Follow CBC  Thrombocytopenia  Unclear etiology.  Follow CBC  Left hip pain with movement  X-ray suggest moderate osteoarthritis  Supportive/symptomatic treatment.  Type II DM  A1c 6.8, adequate control in this elderly female  Metformin currently on hold.  Continue SSI.  Essential hypertension  Controlled off of antihypertensives  Holding losartan  Chronic lymphedema  Sacral decubitus ulcers  Secondary to moisture associated skin damage, partial-thickness wounds  Management per WOCN RN.  Body mass index is 35.87 kg/m.  Nutritional Status Nutrition Problem: Severe Malnutrition Etiology: social / environmental circumstances Signs/Symptoms: percent weight loss,mild fat depletion,moderate fat depletion,mild muscle depletion,moderate muscle depletion Percent weight loss: 6.4 % Interventions: Magic cup,MVI,Refer to RD note for recommendations    DVT prophylaxis: heparin injection 5,000 Units Start: 03/17/20 0600     Code Status: Full Code Family Communication: None at bedside Disposition:  Status is: Inpatient  Remains inpatient appropriate because:Inpatient level of care appropriate due to severity of illness   Dispo: The patient is from: Home              Anticipated d/c is to: SNF              Anticipated d/c date is: 2 days              Patient currently is not medically stable to d/c.        Consultants:   Neurology  Procedures:   None  Antimicrobials:    Anti-infectives (From admission, onward)   Start     Dose/Rate Route Frequency Ordered Stop    03/19/20 1300  fosfomycin (MONUROL) packet 3 g        3 g Oral  Once 03/19/20 1159 03/19/20 1415   03/18/20 0600  cefTRIAXone (ROCEPHIN) 1 g in sodium chloride 0.9 % 100 mL IVPB  Status:  Discontinued        1 g 200 mL/hr over 30 Minutes Intravenous Every 24 hours 03/17/20 0519 03/19/20 1159   03/17/20 0415  cefTRIAXone (ROCEPHIN) 1 g in sodium chloride 0.9 % 100 mL IVPB        1 g 200 mL/hr over 30 Minutes Intravenous  Once 03/17/20 0406 03/17/20 0448        Subjective:  Patient denies complaints.  As per nursing, ate well for breakfast and oriented.  Patient has no complaints today.  Seen sitting up in bed and eating by herself with a spoon out of a cup.  Objective:   Vitals:   03/21/20 0528 03/21/20 1420 03/21/20 1540 03/21/20 1545  BP: 111/72 97/72 112/72 110/68  Pulse: 88 94    Resp: 17 17    Temp: 97.9 F (36.6 C) 98.1 F (36.7 C)    TempSrc: Oral Axillary    SpO2: 98% 100%    Weight:      Height:        General exam:  Pleasant elderly female, moderately built and obese lying comfortably supine in bed without distress.  Oral mucosa moist Respiratory system: Clear to auscultation no increased work of breathing Cardiovascular system: S1 & S2 heard, RRR. No JVD, murmurs, rubs, gallops or clicks. No pedal edema. Gastrointestinal system: Abdomen is nondistended, soft and nontender. No organomegaly or masses felt. Normal bowel sounds heard. Central nervous system: Alert and oriented x3. No focal neurological deficits.  Horizontal nystagmus noted but improving.  Eating by herself and coordinating well. Extremities: Symmetric 5 x 5 power. Skin: Chronic lower extremity skin changes from lymphedema Psychiatry: Judgement and insight appear to be somewhat impaired. Mood & affect appropriate.     Data Reviewed:   I have personally reviewed following labs and imaging studies   CBC: Recent Labs  Lab 03/17/20 0002 03/17/20 0520 03/19/20 0127 03/20/20 0338 03/21/20 0252  WBC  8.4   < > 6.0 5.5 4.9  NEUTROABS 6.9  --   --   --   --   HGB 13.5   < > 10.9* 10.8* 9.5*  HCT 44.1   < > 35.8* 33.9* 29.6*  MCV 80.5   < > 80.1 79.8* 79.6*  PLT 231   < > 165 131* 129*   < > = values in this interval not displayed.    Basic Metabolic Panel: Recent Labs  Lab 03/19/20 1506 03/20/20 0338 03/21/20 0252  NA 153* 150* 149*  K 3.3* 3.3* 3.2*  CL 111 111 114*  CO2 25 27 24   GLUCOSE 126* 114* 106*  BUN 58* 53* 40*  CREATININE 1.64* 1.59* 1.47*  CALCIUM 9.0 9.0 8.8*  MG 1.8  --  1.6*  PHOS  --  1.8* 1.9*    Liver Function Tests: Recent Labs  Lab 03/17/20 0002 03/18/20 0414 03/19/20 0127  AST 63* 40 34  ALT 49* 33 30  ALKPHOS 51 46 46  BILITOT 0.9 0.9 0.8  PROT 7.4 6.2* 5.9*  ALBUMIN 3.3* 2.5* 2.4*    CBG: Recent Labs  Lab 03/21/20 0746 03/21/20 1147 03/21/20 1650  GLUCAP 88 122* 150*    Microbiology Studies:   Recent Results (from the past 240 hour(s))  Resp Panel by RT-PCR (Flu A&B, Covid) Nasopharyngeal Swab     Status: None   Collection Time: 03/16/20 11:13 PM   Specimen: Nasopharyngeal Swab; Nasopharyngeal(NP) swabs in vial transport medium  Result Value Ref Range Status   SARS Coronavirus 2 by RT PCR NEGATIVE NEGATIVE Final    Comment: (NOTE) SARS-CoV-2 target nucleic acids are NOT DETECTED.  The SARS-CoV-2 RNA is generally detectable in upper respiratory specimens during the acute phase of infection. The lowest concentration of SARS-CoV-2 viral copies this assay can detect is 138 copies/mL. A negative result does not preclude SARS-Cov-2 infection and should not be used as the sole basis for treatment or other patient management decisions. A negative result may occur with  improper specimen collection/handling, submission of specimen other than nasopharyngeal swab, presence of viral mutation(s) within the areas targeted by this assay, and inadequate number of viral copies(<138 copies/mL). A negative result must be combined  with clinical observations, patient history, and epidemiological information. The expected result is Negative.  Fact Sheet for Patients:  EntrepreneurPulse.com.au  Fact Sheet for Healthcare Providers:  IncredibleEmployment.be  This test is no t yet approved or cleared by the [place] FDA and  has been authorized for detection and/or diagnosis of SARS-CoV-2 by FDA under an Emergency Use Authorization (EUA). This EUA will remain  in effect (meaning this test can be used) for the duration of the COVID-19 declaration under Section 564(b)(1) of the Act, 21 U.S.C.section 360bbb-3(b)(1), unless the authorization is terminated  or revoked sooner.       Influenza A by PCR NEGATIVE NEGATIVE Final   Influenza B by PCR NEGATIVE NEGATIVE Final    Comment: (NOTE) The Xpert Xpress SARS-CoV-2/FLU/RSV plus assay is intended as an aid in the diagnosis of influenza from Nasopharyngeal swab specimens and should not be used as a sole basis for treatment. Nasal washings and aspirates are unacceptable for Xpert Xpress SARS-CoV-2/FLU/RSV testing.  Fact Sheet for Patients: EntrepreneurPulse.com.au  Fact Sheet for Healthcare Providers: IncredibleEmployment.be  This test is not yet approved or cleared by the [place] FDA and has been authorized for detection and/or diagnosis of SARS-CoV-2 by FDA under an Emergency Use Authorization (EUA). This EUA will remain in effect (meaning this test can be used) for the duration of the COVID-19 declaration under Section 564(b)(1) of the Act, 21 U.S.C. section 360bbb-3(b)(1), unless the authorization is terminated or revoked.  Performed at [facility], [place] [address] [place], [place] [address]   Blood culture (routine x 2)     Status: None (Preliminary result)   Collection Time: 03/17/20  1:04 AM   Specimen: BLOOD  Result Value Ref Range Status   Specimen Description  BLOOD RIGHT ANTECUBITAL  Final   Special Requests   Final    BOTTLES DRAWN AEROBIC AND ANAEROBIC Blood Culture results may not be optimal due to an inadequate volume of blood received in culture bottles   Culture   Final    NO GROWTH 3 DAYS Performed at [facility], [place] [address], [place], [place] [address]    Report Status PENDING  Incomplete  Blood culture (routine x 2)     Status: None (Preliminary result)   Collection Time: 03/17/20  2:07 AM   Specimen: BLOOD RIGHT HAND  Result Value Ref Range Status   Specimen Description BLOOD RIGHT HAND  Final   Special Requests   Final    BOTTLES DRAWN AEROBIC AND ANAEROBIC Blood Culture results may not be optimal due to an inadequate volume of blood received in culture bottles   Culture   Final    NO GROWTH 3 DAYS Performed at [facility], [place] [address] [place], [place] [address]    Report Status PENDING  Incomplete  Urine culture     Status: Abnormal   Collection Time: 03/17/20 11:50 AM   Specimen: Urine, Random  Result Value Ref Range Status   Specimen Description URINE, RANDOM  Final   Special Requests   Final    NONE Performed at [facility], [place] [address], [place], [place] [address]    Culture 20,000 COLONIES/mL ENTEROCOCCUS FAECALIS (A)  Final   Report Status 03/19/2020 FINAL  Final   Organism ID, Bacteria ENTEROCOCCUS FAECALIS (A)  Final      Susceptibility   Enterococcus faecalis - MIC*    AMPICILLIN <=2 SENSITIVE Sensitive     NITROFURANTOIN <=16 SENSITIVE Sensitive     VANCOMYCIN 2 SENSITIVE Sensitive     * 20,000 COLONIES/mL ENTEROCOCCUS FAECALIS     Radiology Studies:  CT CERVICAL SPINE WO CONTRAST  Result Date: 03/20/2020 CLINICAL DATA:  Myelopathy EXAM: CT CERVICAL SPINE WITHOUT CONTRAST TECHNIQUE: Multidetector CT imaging of the cervical spine was performed without intravenous contrast. Multiplanar CT image reconstructions were also generated. COMPARISON:  Cervical spine MRI  03/20/2020  FINDINGS: Alignment: Grade 1 retrolisthesis at C3-4 and C4-5. Skull base and vertebrae: No acute fracture. No primary bone lesion or focal pathologic process. Soft tissues and spinal canal: No prevertebral fluid or swelling. No visible canal hematoma. Disc levels: C2-3: Spondylosis without high-grade spinal canal or neural foraminal stenosis. C3-4: Large uncovertebral osteophytes with severe bilateral foraminal stenosis. Moderate spinal canal stenosis. C4-5: Uncovertebral osteophytes with severe bilateral foraminal stenosis. Moderate spinal canal stenosis. C5-6: Disc space narrowing with small uncovertebral osteophytes. No spinal canal stenosis. Mild foraminal stenosis bilaterally. C6-7: No spinal canal or neural foraminal stenosis. Upper chest: Negative. Other: None. IMPRESSION: 1. Moderate spinal canal stenosis and severe bilateral foraminal stenosis at C3-4 and C4-5. 2. No acute fracture or static subluxation of the cervical spine. Electronically Signed   By: Ulyses Jarred M.D.   On: 03/20/2020 19:09   MR CERVICAL SPINE WO CONTRAST  Result Date: 03/20/2020 CLINICAL DATA:  Cervical radiculopathy rule out infection EXAM: MRI CERVICAL SPINE WITHOUT CONTRAST TECHNIQUE: Multiplanar, multisequence MR imaging of the cervical spine was performed. No intravenous contrast was administered. COMPARISON:  None. FINDINGS: Alignment: Image quality degraded by extensive motion. The patient was not able to complete the study or hold still. 3 mm retrolisthesis C3-4 and C4-5.  Mild retrolisthesis C5-6. Vertebrae: Negative for fracture or mass. No bone marrow edema is present Cord: Cord evaluation limited by extensive motion. Posterior Fossa, vertebral arteries, paraspinal tissues: No soft tissue mass or fluid collection Disc levels: C2-3: Mild disc and facet degeneration.  Mild spinal stenosis C3-4: Disc degeneration with posterior spurring. Moderate spinal stenosis and moderate to severe foraminal encroachment bilaterally  C4-5: Disc degeneration and spurring. Moderate spinal stenosis. Severe foraminal stenosis bilaterally C5-6: Disc degeneration and spurring. Mild spinal stenosis and moderate to severe foraminal stenosis bilaterally C6-7: Mild degenerative change.  Negative for stenosis C7-T1: Mild degenerative change.  Negative for stenosis. IMPRESSION: Limited study. The patient was not able to hold still or complete the study. Multilevel spondylosis causing significant spinal and foraminal stenosis at multiple levels. Recommend CT cervical spine for further evaluation. Electronically Signed   By: Franchot Gallo M.D.   On: 03/20/2020 17:00     Scheduled Meds:   . feeding supplement  237 mL Oral TID BM  . free water  100 mL Oral Q4H  . heparin  5,000 Units Subcutaneous Q8H  . insulin aspart  0-5 Units Subcutaneous QHS  . insulin aspart  0-9 Units Subcutaneous TID WC  . multivitamin with minerals  1 tablet Oral Daily    Continuous Infusions:   . sodium chloride 100 mL/hr at 03/21/20 0803  . potassium PHOSPHATE IVPB (in mmol) 30 mmol (03/21/20 1452)  . [START ON 03/23/2020] thiamine injection    . thiamine injection 500 mg (03/21/20 1456)     LOS: 4 days     Vernell Leep, MD, [place], [facility]. [facility]    To contact the attending provider between 7A-7P or the covering provider during after hours 7P-7A, please log into the web site [email or web address] and access using universal [place] password for that web site. If you do not have the password, please call the hospital operator.  03/21/2020, 5:24 PM

## 2020-03-22 DIAGNOSIS — R112 Nausea with vomiting, unspecified: Secondary | ICD-10-CM

## 2020-03-22 DIAGNOSIS — R1310 Dysphagia, unspecified: Secondary | ICD-10-CM

## 2020-03-22 DIAGNOSIS — E43 Unspecified severe protein-calorie malnutrition: Secondary | ICD-10-CM

## 2020-03-22 LAB — CBC
HCT: 30 % — ABNORMAL LOW (ref 36.0–46.0)
Hemoglobin: 9.5 g/dL — ABNORMAL LOW (ref 12.0–15.0)
MCH: 25.4 pg — ABNORMAL LOW (ref 26.0–34.0)
MCHC: 31.7 g/dL (ref 30.0–36.0)
MCV: 80.2 fL (ref 80.0–100.0)
Platelets: 110 10*3/uL — ABNORMAL LOW (ref 150–400)
RBC: 3.74 MIL/uL — ABNORMAL LOW (ref 3.87–5.11)
RDW: 20.1 % — ABNORMAL HIGH (ref 11.5–15.5)
WBC: 5.6 10*3/uL (ref 4.0–10.5)
nRBC: 1.4 % — ABNORMAL HIGH (ref 0.0–0.2)

## 2020-03-22 LAB — GLUCOSE, CAPILLARY
Glucose-Capillary: 70 mg/dL (ref 70–99)
Glucose-Capillary: 115 mg/dL — ABNORMAL HIGH (ref 70–99)
Glucose-Capillary: 105 mg/dL — ABNORMAL HIGH (ref 70–99)
Glucose-Capillary: 89 mg/dL (ref 70–99)

## 2020-03-22 LAB — PHOSPHORUS: Phosphorus: 3.4 mg/dL (ref 2.5–4.6)

## 2020-03-22 LAB — BASIC METABOLIC PANEL
Anion gap: 9 (ref 5–15)
BUN: 29 mg/dL — ABNORMAL HIGH (ref 8–23)
CO2: 25 mmol/L (ref 22–32)
Calcium: 8.4 mg/dL — ABNORMAL LOW (ref 8.9–10.3)
Chloride: 112 mmol/L — ABNORMAL HIGH (ref 98–111)
Creatinine, Ser: 1.35 mg/dL — ABNORMAL HIGH (ref 0.44–1.00)
GFR, Estimated: 41 mL/min — ABNORMAL LOW (ref >60)
Glucose, Bld: 107 mg/dL — ABNORMAL HIGH (ref 70–99)
Potassium: 3.7 mmol/L (ref 3.5–5.1)
Sodium: 146 mmol/L — ABNORMAL HIGH (ref 135–145)

## 2020-03-22 LAB — CULTURE, BLOOD (ROUTINE X 2)
Culture: NO GROWTH
Culture: NO GROWTH

## 2020-03-22 LAB — MAGNESIUM: Magnesium: 1.8 mg/dL (ref 1.7–2.4)

## 2020-03-22 MED ORDER — PANTOPRAZOLE SODIUM 40 MG PO TBEC
40.0000 mg | DELAYED_RELEASE_TABLET | Freq: Two times a day (BID) | ORAL | Status: DC
  Administered 2020-03-22: 40 mg via ORAL
  Filled 2020-03-22 (×3): qty 1

## 2020-03-22 MED ORDER — PANTOPRAZOLE SODIUM 40 MG PO TBEC: 40.0000 mg | DELAYED_RELEASE_TABLET | Freq: Two times a day (BID) | ORAL | Status: DC

## 2020-03-22 NOTE — NC FL2 (Signed)
[place] MEDICAID FL2 LEVEL OF CARE SCREENING TOOL     IDENTIFICATION  Patient Name: [name] [name] Birthdate: [birth year] Sex: female Admission Date (Current Location): 03/16/2020  [facility] and [place] Number:  Herbalist and Address:  The . [facility], [place] [address] [place], [place] [address]      Provider Number: [id number]  Attending Physician Name and Address:  Modena Jansky, MD  Relative Name and Phone Number:       Current Level of Care: Hospital Recommended Level of Care: [facility] Prior Approval Number:    Date Approved/Denied:   PASRR Number: [id number]  Discharge Plan: SNF    Current Diagnoses: Patient Active Problem List   Diagnosis Date Noted  . Protein-calorie malnutrition, severe 03/19/2020  . UTI (urinary tract infection) 03/17/2020  . Sepsis ([place]) 03/17/2020  . Hypothermia 03/17/2020  . Acute kidney injury superimposed on chronic kidney disease ([place]) 03/17/2020  . Delirium 03/17/2020  . Intractable vomiting 02/15/2020  . Klebsiella cystitis 02/15/2020  . Bilateral leg ulcer, limited to breakdown of skin ([place]) 01/16/2020  . Decreased urination 01/16/2020  . Dehydration 01/16/2020  . Allergic rhinitis 07/28/2019  . Postnasal drip 11/16/2017  . Sensorineural hearing loss (SNHL), bilateral 11/16/2017  . Tinnitus of both ears 11/16/2017  . Obesity, Class II, BMI 35-39.9, isolated (see actual BMI) 11/08/2017  . CKD (chronic kidney disease), stage III ([place]) 11/08/2017  . Anemia 11/07/2017  . Thrombocytosis 11/07/2017  . Lymphedema 11/07/2017  . AKI (acute kidney injury) ([place]) 10/16/2017  . Routine general medical examination at a health care facility 05/10/2014  . Paroxysmal SVT (supraventricular tachycardia) ([place]) 11/28/2013  . Physical deconditioning 11/23/2013  . Generalized weakness 11/17/2013  . Nausea 11/17/2013  . Multilevel spine pain 11/17/2013  . Aortic stenosis, mild 11/17/2013  . DM  type 2 (diabetes mellitus, type 2) ([place]) 02/02/2013  . Arthritis 10/19/2012  . Mixed incontinence 10/19/2012  . GERD (gastroesophageal reflux disease) 04/20/2011  . HTN (hypertension) 01/20/2011  . Morbid obesity ([place])     Orientation RESPIRATION BLADDER Height & Weight     Self,Place  Normal Incontinent Weight: 250 lb (113.4 kg) Height:  5\' 10"  (177.8 cm)  BEHAVIORAL SYMPTOMS/MOOD NEUROLOGICAL BOWEL NUTRITION STATUS      Incontinent Diet (See discharge summary)  AMBULATORY STATUS COMMUNICATION OF NEEDS Skin   Extensive Assist Verbally Surgical wounds                       Personal Care Assistance Level of Assistance  Bathing,Dressing,Feeding Bathing Assistance: Maximum assistance Feeding assistance: Limited assistance Dressing Assistance: Maximum assistance     Functional Limitations Info  Sight,Speech,Hearing Sight Info: Adequate Hearing Info: Adequate Speech Info: Adequate    SPECIAL CARE FACTORS FREQUENCY  PT (By licensed PT),OT (By licensed OT)     PT Frequency: 5x a week OT Frequency: 5x a week            Contractures Contractures Info: Not present    Additional Factors Info  Code Status,Allergies,Insulin Sliding Scale Code Status Info: Full Allergies Info: Oxycodone Hcl   Penicillins   Insulin Sliding Scale Info: see discharge summary       Current Medications (03/22/2020):  This is the current hospital active medication list Current Facility-Administered Medications  Medication Dose Route Frequency Provider Last Rate Last Admin  . 0.45 % sodium chloride infusion   Intravenous Continuous Modena Jansky, MD 100 mL/hr at 03/22/20 1210 Restarted at 03/22/20 1210  .  acetaminophen (TYLENOL) tablet 650 mg  650 mg Oral Q6H PRN Chotiner, Yevonne Aline, MD   650 mg at 03/21/20 2139   Or  . acetaminophen (TYLENOL) suppository 650 mg  650 mg Rectal Q6H PRN Chotiner, Yevonne Aline, MD      . feeding supplement (ENSURE ENLIVE / ENSURE PLUS) liquid 237 mL  237 mL  Oral TID BM Hongalgi, Lenis Dickinson, MD   237 mL at 03/22/20 1329  . free water 100 mL  100 mL Oral Q4H Hongalgi, Anand D, MD   100 mL at 03/22/20 1207  . heparin injection 5,000 Units  5,000 Units Subcutaneous Q8H Chotiner, Yevonne Aline, MD   5,000 Units at 03/22/20 1327  . HYDROmorphone (DILAUDID) injection 0.5 mg  0.5 mg Intravenous Q4H PRN Domenic Polite, MD   0.5 mg at 03/18/20 1841  . insulin aspart (novoLOG) injection 0-5 Units  0-5 Units Subcutaneous QHS Chotiner, Yevonne Aline, MD      . insulin aspart (novoLOG) injection 0-9 Units  0-9 Units Subcutaneous TID WC Chotiner, Yevonne Aline, MD   1 Units at 03/21/20 1743  . multivitamin with minerals tablet 1 tablet  1 tablet Oral Daily Modena Jansky, MD   1 tablet at 03/22/20 0859  . ondansetron (ZOFRAN) injection 4 mg  4 mg Intravenous Q6H PRN Domenic Polite, MD      . pantoprazole (PROTONIX) EC tablet 40 mg  40 mg Oral BID AC Vonzella Nipple, NP   40 mg at 03/22/20 1330  . [START ON 03/23/2020] thiamine (B-1) 250 mg in sodium chloride 0.9 % 50 mL IVPB  250 mg Intravenous Daily Kirby-Graham, Karsten Fells, NP         Discharge Medications: Please see discharge summary for a list of discharge medications.  Relevant Imaging Results:  Relevant Lab Results:   Additional Information SSN [id number]  Emeterio Reeve, [place]

## 2020-03-22 NOTE — Progress Notes (Signed)
Occupational Therapy Evaluation Patient Details Name: [name] [name] MRN: [id number] DOB: 19-Sep-1945 Today's Date: 03/22/2020    History of Present Illness Pt is a 74 y.o. female admitted 03/16/20 found down by EMS after daughter requested wellness check having not heard from her for a couple days. Workup for metabolic encephalopathy, ARF, dehydration. Pt with L hip pain, xray with moderate osteoarthritis. Head CT negative for acute abnormality; age-related atrophy. MRI suggestive of Wenicke encephalopathy. PMH includes CKD3, CHF, DM2, HTN, OA, chronic lymphedema, venous stasis ulcers.   Clinical Impression   Pt alert and talking on the phone upon arrival. Pt with improved cognition/mentation today. Pt sat EOB max A +2 with pain in B LEs during bed mobility. Pt sat OEB for grooming and ADL tasks. Pt c/o of dizziness once seated EOB, BP 95/70. NT present and reports that BP has been reading around those numbers when pt in supine. Sit - stand x 2 trials from EOB with RW max A +2 , pt unable to achieve complete stand but was able to clear buttocks from bed. Pt returned to supine total A +2. OT will continue to follow acutely to maximize level of function and safety    Follow Up Recommendations  SNF    Equipment Recommendations  3 in 1 bedside commode;Wheelchair (measurements OT) (TBD at next venue of care)    Recommendations for Other Services       Precautions / Restrictions Precautions Precautions: Fall Restrictions Weight Bearing Restrictions: No      Mobility Bed Mobility Overal bed mobility: Needs Assistance Bed Mobility: Supine to Sit;Sit to Supine     Supine to sit: Max assist;+2 for physical assistance;HOB elevated Sit to supine: Total assist;+2 for physical assistance   General bed mobility comments: max A + 2 to bring LEs to EOB, off EOB and to elevate trunk, total A with LEs back onto bed and to scoot/position to [facility] using pads underneath pt    Transfers Overall  transfer level: Needs assistance Equipment used: Rolling walker (2 wheeled) Transfers: Sit to/from Stand Sit to Stand: Max assist;+2 physical assistance         General transfer comment: Pt c/o of dizziness once seated EOB, BP 95/70. NT present and reports that BP has been reading around those numbers when pt in supine. Sit - stand x 2 trials from EOB with RW max A +2 , pt unable to achieve complete stand but was able to clear buttocks from bed    Balance Overall balance assessment: Needs assistance Sitting-balance support: Bilateral upper extremity supported Sitting balance-Leahy Scale: Fair Sitting balance - Comments: min guard for sitting balance   Standing balance support: Bilateral upper extremity supported Standing balance-Leahy Scale: Zero                             ADL either performed or assessed with clinical judgement   ADL Overall ADL's : Needs assistance/impaired     Grooming: Wash/dry hands;Wash/dry face;Sitting;Min guard   Upper Body Bathing: Sitting;Minimal assistance   Lower Body Bathing: Maximal assistance;Sitting/lateral leans   Upper Body Dressing : Sitting;Minimal assistance   Lower Body Dressing: Total assistance       Toileting- Clothing Manipulation and Hygiene: Total assistance;Bed level         General ADL Comments: Pt c/o of dizziness once seated EOB, BP 95/70. NT present and reports that BP has been reading around those numbers when pt in supine. Sit - stand  x 2 trials from EOB with RW max A +2 , pt unable to achieve complete stand for [facility] transfers but was able to clear buttocks from bed     Vision Baseline Vision/History: Wears glasses Wears Glasses: Reading only Patient Visual Report: No change from baseline       Perception     Praxis      Pertinent Vitals/Pain Pain Assessment: Faces Faces Pain Scale: Hurts little more Pain Location: BLEs with bed mobility Pain Descriptors / Indicators: Grimacing;Moaning Pain  Intervention(s): Limited activity within patient's tolerance;Monitored during session;Repositioned     Hand Dominance     Extremity/Trunk Assessment             Communication     Cognition Arousal/Alertness: Awake/alert Behavior During Therapy: WFL for tasks assessed/performed Overall Cognitive Status: Within Functional Limits for tasks assessed                         Following Commands: Follows one step commands inconsistently Safety/Judgement: Decreased awareness of deficits;Decreased awareness of safety   Problem Solving: Slow processing;Decreased initiation;Difficulty sequencing;Requires verbal cues;Requires tactile cues General Comments: pt with improved cognition this session, she was on the phone upon arrival   General Comments       Exercises     Shoulder Instructions      Home Living                                          Prior Functioning/Environment                   OT Problem List:        OT Treatment/Interventions:      OT Goals(Current goals can be found in the care plan section)    OT Frequency: Min 2X/week   Barriers to D/C:            Co-evaluation   Reason for Co-Treatment: Complexity of the patient's impairments (multi-system involvement);For patient/therapist safety;To address functional/ADL transfers;Necessary to address cognition/behavior during functional activity   OT goals addressed during session: ADL's and self-care;Proper use of Adaptive equipment and DME      AM-PAC OT "6 Clicks" Daily Activity     Outcome Measure Help from another person eating meals?: A Little (NPO, however pt with B UE AROM and strength WNL for this task) Help from another person taking care of personal grooming?: A Little Help from another person toileting, which includes using toliet, bedpan, or urinal?: Total Help from another person bathing (including washing, rinsing, drying)?: A Lot Help from another person to  put on and taking off regular upper body clothing?: A Lot Help from another person to put on and taking off regular lower body clothing?: Total 6 Click Score: 12   End of Session Equipment Utilized During Treatment: Gait belt;Rolling walker  Activity Tolerance: Patient limited by fatigue;Other (comment) (dizziness) Patient left: with call bell/phone within reach;with family/visitor present;in bed;with chair alarm set  OT Visit Diagnosis: Other abnormalities of gait and mobility (R26.89);Muscle weakness (generalized) (M62.81);History of falling (Z91.81);Pain;Other symptoms and signs involving cognitive function;Cognitive communication deficit (R41.841) Pain - Right/Left:  (bilaterally) Pain - part of body: Leg                Time: 1340-1407 OT Time Calculation (min): 27 min Charges:  OT General Charges $OT Visit: 1 Visit OT  Treatments $Self Care/Home Management : 8-22 mins    Emmit Alexanders [facility] 03/22/2020, 3:25 PM

## 2020-03-22 NOTE — Progress Notes (Signed)
SLP Cancellation Note  Patient Details Name: [name] [name] MRN: [id number] DOB: [birth year]   Cancelled treatment:   GI planning EGD tomorrow. Will follow for results/plan of care.  Kori Goins L. Tivis Ringer, [place] Office number [phone] Pager [phone]        Juan Quam Laurice 03/22/2020, 2:21 PM

## 2020-03-22 NOTE — Progress Notes (Signed)
Physical Therapy Treatment Patient Details Name: [name] [name] MRN: [id number] DOB: 21-Dec-1945 Today's Date: 03/22/2020    History of Present Illness Pt is a 74 y.o. female admitted 03/16/20 found down by EMS after daughter requested wellness check having not heard from her for a couple days. Workup for metabolic encephalopathy, ARF, dehydration. Pt with L hip pain, xray with moderate osteoarthritis. Head CT negative for acute abnormality; age-related atrophy. MRI suggestive of Wenicke encephalopathy. PMH includes CKD3, CHF, DM2, HTN, OA, chronic lymphedema, venous stasis ulcers.    PT Comments    Pt supine in air bed and alert and agreeable to mobility tasks this session, with improved mentation/command following. Pt remains limited due to significant deconditioning, able to perform bed mobility with +71maxA to get to EOB but needs increased assist +2total A for return to supine due to significant fatigue at end of session. Pt total A +2 for posterior supine scooting toward HOB. Pt performed seated BLE A/AAROM therapeutic exercises with fair tolerance, and reports persistent dizziness seated EOB, BP stable at 95/70 (79). Pt able to perform x2 standing attempts from elevated bed height to RW with +53maxA but unable to reach full hip/knee extension. Pt continues to benefit from skilled rehab in a post acute setting to maximize functional gains before returning home.  Follow Up Recommendations  SNF;Supervision/Assistance - 24 hour     Equipment Recommendations  Rolling walker with 5" wheels;3in1 (PT);Wheelchair (measurements PT);Wheelchair cushion (measurements PT);Hospital bed    Recommendations for Other Services       Precautions / Restrictions Precautions Precautions: Fall Restrictions Weight Bearing Restrictions: No    Mobility  Bed Mobility Overal bed mobility: Needs Assistance Bed Mobility: Supine to Sit;Sit to Supine     Supine to sit: Max assist;+2 for physical  assistance;HOB elevated Sit to supine: Total assist;+2 for physical assistance   General bed mobility comments: max A + 2 to bring LEs to EOB, off EOB and to elevate trunk, total A with LEs back onto bed and to scoot/position to [facility] using pads underneath pt  Transfers Overall transfer level: Needs assistance Equipment used: Rolling walker (2 wheeled) Transfers: Sit to/from Stand Sit to Stand: Max assist;+2 physical assistance         General transfer comment: Pt c/o of dizziness once seated EOB, BP 95/70. NT present and reports that BP has been reading around those numbers when pt in supine. Sit - stand x 2 trials from EOB with RW max A +2 , pt unable to achieve complete stand but was able to clear buttocks from bed  Ambulation/Gait                 Stairs             Wheelchair Mobility    Modified Rankin (Stroke Patients Only)       Balance Overall balance assessment: Needs assistance Sitting-balance support: Bilateral upper extremity supported Sitting balance-Leahy Scale: Fair Sitting balance - Comments: min guard for sitting balance   Standing balance support: Bilateral upper extremity supported Standing balance-Leahy Scale: Zero                              Cognition Arousal/Alertness: Awake/alert Behavior During Therapy: WFL for tasks assessed/performed Overall Cognitive Status: Within Functional Limits for tasks assessed Area of Impairment: Problem solving  Following Commands: Follows one step commands with increased time;Follows one step commands inconsistently Safety/Judgement: Decreased awareness of deficits;Decreased awareness of safety   Problem Solving: Slow processing;Decreased initiation;Difficulty sequencing;Requires verbal cues;Requires tactile cues General Comments: pt with improved cognition this session, she was on the phone upon arrival; somewhat anxious with EOB mobility and repeats fear of  falling prior to standing attempts      Exercises General Exercises - Lower Extremity Ankle Circles/Pumps: AROM;Strengthening;Both;10 reps;Seated Long Arc Quad: AAROM;Strengthening;Both;10 reps;Seated    General Comments General comments (skin integrity, edema, etc.): pt with dry, flaking skin, especially distal to B knees, one small area of skin breakdown noted to L hip (<1 cm)      Pertinent Vitals/Pain Pain Assessment: Faces Faces Pain Scale: Hurts little more Pain Location: BLEs with bed mobility Pain Descriptors / Indicators: Grimacing;Moaning Pain Intervention(s): Monitored during session;Repositioned;Limited activity within patient's tolerance  SpO2 100% on RA and HR 108 bpm during mobility tasks  Home Living                      Prior Function            PT Goals (current goals can now be found in the care plan section) Acute Rehab PT Goals Patient Stated Goal: Agreeable to post-acute rehab services PT Goal Formulation: With family Time For Goal Achievement: 04/02/20 Potential to Achieve Goals: Fair Progress towards PT goals: Progressing toward goals (slow progress)    Frequency    Min 2X/week      PT Plan Current plan remains appropriate    Co-evaluation PT/OT/SLP Co-Evaluation/Treatment: Yes Reason for Co-Treatment: Complexity of the patient's impairments (multi-system involvement);Necessary to address cognition/behavior during functional activity;For patient/therapist safety;To address functional/ADL transfers PT goals addressed during session: Mobility/safety with mobility;Balance;Proper use of DME;Strengthening/ROM OT goals addressed during session: ADL's and self-care;Proper use of Adaptive equipment and DME      AM-PAC PT "6 Clicks" Mobility   Outcome Measure  Help needed turning from your back to your side while in a flat bed without using bedrails?: A Lot Help needed moving from lying on your back to sitting on the side of a flat bed  without using bedrails?: A Lot Help needed moving to and from a bed to a chair (including a wheelchair)?: Total Help needed standing up from a chair using your arms (e.g., wheelchair or bedside chair)?: Total Help needed to walk in hospital room?: Total Help needed climbing 3-5 steps with a railing? : Total 6 Click Score: 8    End of Session Equipment Utilized During Treatment: Gait belt Activity Tolerance: Patient tolerated treatment well (fatigues quickly but improved participation) Patient left: in bed;with call bell/phone within reach;with family/visitor present (air bed with 4 rails up, heels floated) Nurse Communication: Mobility status;Need for lift equipment PT Visit Diagnosis: Other abnormalities of gait and mobility (R26.89);Muscle weakness (generalized) (M62.81)     Time: 2010-0712 PT Time Calculation (min) (ACUTE ONLY): 29 min  Charges:  $Therapeutic Activity: 8-22 mins                     Persais Ethridge P., PTA Acute Rehabilitation Services Pager: [phone] Office: [place] 03/22/2020, 4:21 PM

## 2020-03-22 NOTE — TOC Initial Note (Addendum)
Transition of Care [facility]) - Initial/Assessment Note    Patient Details  Name: [name] [name] MRN: [id number] Date of Birth: Oct 31, 1945  [facility] CM/SW Contact:    Emeterio Reeve, [place] Phone Number: 03/22/2020, 2:44 PM  Clinical Narrative:                 CSW spoke to [name] on the phone. Renaye Rakers stated that PTA pt was living at home alone. Pt used a walker with seat. Renaye Rakers stated its hard for pt to use walker in the house. Renaye Rakers has concerns about pts living conditions due to hoarding tendencies.  PT has had the moderna covid vaccines.   CSW reviewed pt/ot notes of SNF. Renaye Rakers stated that she agrees that pt needs snf before returning back home. [name] did not have a preferred facility and agreed for pt to be faxed out to facilities in the area. CSW encourged [name] to review medicare.gov rating scale.    Expected Discharge Plan: Skilled Nursing Facility Barriers to Discharge: Continued Medical Work up   Patient Goals and CMS Choice Patient states their goals for this hospitalization and ongoing recovery are:: pt unable to verbilize goals CMS Medicare.gov Compare Post Acute Care list provided to:: Patient Represenative (must comment) Choice offered to / list presented to : Adult Children  Expected Discharge Plan and Services Expected Discharge Plan: Skilled Nursing Facility In-house Referral: Clinical Social Work Discharge Planning Services: CM Consult   Living arrangements for the past 2 months: [place]                                      Prior Living Arrangements/Services Living arrangements for the past 2 months: Single Family Home Lives with:: Self Patient language and need for interpreter reviewed:: Yes Do you feel safe going back to the place where you live?: Yes      Need for Family Participation in Patient Care: Yes (Comment) Care giver support system in place?: Yes (comment)   Criminal Activity/Legal Involvement  Pertinent to Current Situation/Hospitalization: No - Comment as needed  Activities of Daily Living Home Assistive Devices/Equipment: Walker (specify type),Other (Comment) ADL Screening (condition at time of admission) Patient's cognitive ability adequate to safely complete daily activities?: Yes Is the patient deaf or have difficulty hearing?: No Does the patient have difficulty seeing, even when wearing glasses/contacts?: No Does the patient have difficulty concentrating, remembering, or making decisions?: No Patient able to express need for assistance with ADLs?: Yes Does the patient have difficulty dressing or bathing?: No Independently performs ADLs?: Yes (appropriate for developmental age) Does the patient have difficulty walking or climbing stairs?: Yes Weakness of Legs: Both Weakness of Arms/Hands: Both  Permission Sought/Granted Permission sought to share information with : Family Chief Financial Officer Permission granted to share information with : Yes, Verbal Permission Granted     Permission granted to share info w AGENCY: SNF  Permission granted to share info w Relationship: daughter Gaynell Face     Emotional Assessment Appearance:: Appears younger than stated age Attitude/Demeanor/Rapport: Engaged Affect (typically observed): Appropriate Orientation: : Fluctuating Orientation (Suspected and/or reported Sundowners) Alcohol / Substance Use: Not Applicable Psych Involvement: No (comment)  Admission diagnosis:  UTI (urinary tract infection) [N39.0] Acute cystitis with hematuria [N30.01] Pain, hip [M25.559] AKI (acute kidney injury) ([place]) [N17.9] Hypothermia, initial encounter [T68.XXXA] AMS (altered mental status) [R41.82] Patient Active Problem List   Diagnosis Date  Noted  . Protein-calorie malnutrition, severe 03/19/2020  . UTI (urinary tract infection) 03/17/2020  . Sepsis ([place]) 03/17/2020  . Hypothermia 03/17/2020  . Acute kidney injury  superimposed on chronic kidney disease ([place]) 03/17/2020  . Delirium 03/17/2020  . Intractable vomiting 02/15/2020  . Klebsiella cystitis 02/15/2020  . Bilateral leg ulcer, limited to breakdown of skin ([place]) 01/16/2020  . Decreased urination 01/16/2020  . Dehydration 01/16/2020  . Allergic rhinitis 07/28/2019  . Postnasal drip 11/16/2017  . Sensorineural hearing loss (SNHL), bilateral 11/16/2017  . Tinnitus of both ears 11/16/2017  . Obesity, Class II, BMI 35-39.9, isolated (see actual BMI) 11/08/2017  . CKD (chronic kidney disease), stage III ([place]) 11/08/2017  . Anemia 11/07/2017  . Thrombocytosis 11/07/2017  . Lymphedema 11/07/2017  . AKI (acute kidney injury) ([place]) 10/16/2017  . Routine general medical examination at a health care facility 05/10/2014  . Paroxysmal SVT (supraventricular tachycardia) ([place]) 11/28/2013  . Physical deconditioning 11/23/2013  . Generalized weakness 11/17/2013  . Nausea 11/17/2013  . Multilevel spine pain 11/17/2013  . Aortic stenosis, mild 11/17/2013  . DM type 2 (diabetes mellitus, type 2) ([place]) 02/02/2013  . Arthritis 10/19/2012  . Mixed incontinence 10/19/2012  . GERD (gastroesophageal reflux disease) 04/20/2011  . HTN (hypertension) 01/20/2011  . Morbid obesity ([place])    PCP:  Hoyt Koch, MD Pharmacy:   [id number] - [place], [place] [address] Phone: [phone] Fax: [phone]     Social Determinants of Health (SDOH) Interventions    Readmission Risk Interventions No flowsheet data found.  Emeterio Reeve, Latanya Presser, [place] Social Worker [phone]

## 2020-03-22 NOTE — Consult Note (Addendum)
Consultation  Referring Provider: Dr. Algis Liming    Primary Care Physician:  Hoyt Koch, MD Primary Gastroenterologist: Dr. Carlean Purl       Reason for Consultation:   Nausea, vomiting and dysphagia         HPI:   [name] [name] is a 74 y.o. female with a past medical history significant for diabetes, CKD, CHF, hypertension, osteoarthritis and others listed below, who presented to the hospital on 03/16/2020 from her independent living facility after her daughter could not get in touch with her and she was found by police sitting on the floor, leaning forward in the center living space with bugs crawling on her next to her walker sitting in her soiled clothes with excrement on the floor around her.  We are consulted in regards to nausea vomiting and dysphagia.    11/19 CT the abdomen pelvis unremarkable.    03/08/2020 patient saw her PCP for intractable nausea and vomiting for 4 to 6 weeks with profound weakness and lightheadedness.  Found to have a UTI in the ED and started on antibiotic therapy.    According to hospitalist today patient's nausea and vomiting has been improving but she is having trouble with dysphagia.    Today, the patient tells me that she is feeling fairly well but describes nausea and vomiting for at least the past 6 weeks telling me that anything she put in her mouth either felt like it gets stuck on the way down or would come back up shortly after eating it.  Lately she tells me she is only able to get certain things down, she tried to eat grits this morning and this did not even feel like it was going down.  She is currently drinking a Boost which does seem to be making it down and staying down.  Denies any abdominal pain.    Denies fever or chills.  ED course: UTI, started on antibiotic, hyponatremia, creatinine 3.4, mildly elevated CK, lactate 2.1 and a CT of the head noted age-related atrophy, admitted for acute metabolic encephalopathy  Hospital course:  Bedside swallow eval 03/21/2020 with demonstration of prolonged and effortful chewing, apparently reported that solids "sometimes get stuck" while eating, that time recommend dysphagia 2 with thin liquids recommended she have GI consult due to her history of Schatzki's ring and dilation in 2019  GI history: 12/21/2017 EGD with Dr. Bryan Lemma with a mild Schatzki's ring dilated with a 20 mm balloon, gastritis and otherwise normal.  Pathology showed reactive gastropathy 12/22/2017 telephone call described nausea, started on Reglan 5 mg twice daily x2 weeks 01/05/2018 office visit for follow-up of nausea and vomiting with weight loss at that time patient been feeling completely better on Reglan, at that time she was due to stop her Reglan the next day, discussed that if she continued with symptoms then would recommend a gastric emptying study for eval of possible gastroparesis      Past Medical History:  Diagnosis Date  . Acute kidney injury ([place])   . Aortic stenosis, mild 11/17/2013  . Arthritis    "both knees, spine, back, fingers" (11/29/2013)  . CHF (congestive heart failure) ([place])   . Edema   . GERD (gastroesophageal reflux disease)   . Heart murmur   . History of diastolic dysfunction    Echo 04/6604 with diastolic dysfunction  . HTN (hypertension)   . Morbid obesity ([place])   . OA (osteoarthritis)   . PAC (premature atrial contraction)    per  prior Holter  . Palpitations   . Pneumonia    "as a child"  . PVC's (premature ventricular contractions)    per prior Holter  . SVT (supraventricular tachycardia) ([place])   . Tachycardia    noted at 07/28/11 visit. Started on beta blocker. Possible atrial flutter vs long PR tachycardia/AVNRT  . Type II diabetes mellitus ([place])     Past Surgical History:  Procedure Laterality Date  . CESAREAN SECTION  1985  . CESAREAN SECTION  1985  . SUPRAVENTRICULAR TACHYCARDIA ABLATION  11/29/2013  . SUPRAVENTRICULAR TACHYCARDIA ABLATION N/A 11/29/2013    Procedure: SUPRAVENTRICULAR TACHYCARDIA ABLATION;  Surgeon: Evans Lance, MD;  Location: [facility] CATH LAB;  Service: Cardiovascular;  Laterality: N/A;  . US ECHOCARDIOGRAPHY  07/25/2008   EF 55-60%  . VAGINAL DELIVERY      Family History  Problem Relation Age of Onset  . Alzheimer's disease Mother   . Lung cancer Mother   . COPD Father   . Diabetes Maternal Uncle   . Stroke Neg Hx   . Colon cancer Neg Hx   . Stomach cancer Neg Hx   . Esophageal cancer Neg Hx      Social History   Tobacco Use  . Smoking status: Former Smoker    Packs/day: 2.00    Years: 15.00    Pack years: 30.00    Types: Cigarettes    Quit date: 11/23/1985    Years since quitting: 34.3  . Smokeless tobacco: Never Used  Vaping Use  . Vaping Use: Never used  Substance Use Topics  . Alcohol use: No  . Drug use: No    Prior to Admission medications   Medication Sig Start Date End Date Taking? Authorizing Provider  aspirin EC 81 MG tablet Take 81 mg by mouth every other day.   Yes [provider]  feeding supplement, ENSURE COMPLETE, (ENSURE COMPLETE) LIQD Take 237 mLs by mouth 2 (two) times daily between meals. 07/28/19  Yes Hoyt Koch, MD  furosemide (LASIX) 40 MG tablet TAKE 1 TABLET BY MOUTH EVERY DAY Patient taking differently: Take by mouth daily. 04/10/19  Yes Hoyt Koch, MD  losartan (COZAAR) 25 MG tablet TAKE 4 TABLETS (100 MG TOTAL) BY MOUTH DAILY. 05/08/19  Yes Hoyt Koch, MD  metFORMIN (GLUCOPHAGE) 500 MG tablet Take 1 tablet (500 mg total) by mouth daily with breakfast. 05/08/19  Yes Hoyt Koch, MD  metoCLOPramide (REGLAN) 5 MG tablet Take 1 tablet (5 mg total) by mouth 3 (three) times daily as needed for nausea. 02/27/20  Yes Hoyt Koch, MD  metoprolol tartrate (LOPRESSOR) 50 MG tablet TAKE 1 TABLET BY MOUTH TWICE A DAY Patient taking differently: Take 50 mg by mouth 2 (two) times daily. 05/08/19  Yes Hoyt Koch, MD  montelukast  (SINGULAIR) 10 MG tablet Take 1 tablet (10 mg total) by mouth at bedtime. 07/28/19  Yes Hoyt Koch, MD  ondansetron (ZOFRAN ODT) 4 MG disintegrating tablet Take 1 tablet (4 mg total) by mouth every 8 (eight) hours as needed for nausea or vomiting. 03/08/20  Yes Hoyt Koch, MD  pantoprazole (PROTONIX) 40 MG tablet TAKE 1 TABLET BY MOUTH EVERY DAY Patient taking differently: Take 40 mg by mouth daily. 04/10/19  Yes Hoyt Koch, MD  promethazine (PHENERGAN) 25 MG suppository Place 1 suppository (25 mg total) rectally every 6 (six) hours as needed for nausea or vomiting. 02/27/20  Yes Hoyt Koch, MD  nutrition supplement, Fanny Dance, [facility])  PACK Take 1 packet by mouth 2 (two) times daily between meals. 07/28/19   Hoyt Koch, MD    Current Facility-Administered Medications  Medication Dose Route Frequency Provider Last Rate Last Admin  . 0.45 % sodium chloride infusion   Intravenous Continuous Modena Jansky, MD 100 mL/hr at 03/22/20 0301 New Bag at 03/22/20 0301  . acetaminophen (TYLENOL) tablet 650 mg  650 mg Oral Q6H PRN Chotiner, Yevonne Aline, MD   650 mg at 03/21/20 2139   Or  . acetaminophen (TYLENOL) suppository 650 mg  650 mg Rectal Q6H PRN Chotiner, Yevonne Aline, MD      . feeding supplement (ENSURE ENLIVE / ENSURE PLUS) liquid 237 mL  237 mL Oral TID BM Modena Jansky, MD   237 mL at 03/22/20 0903  . free water 100 mL  100 mL Oral Q4H Hongalgi, Anand D, MD   100 mL at 03/22/20 0330  . heparin injection 5,000 Units  5,000 Units Subcutaneous Q8H Chotiner, Yevonne Aline, MD   5,000 Units at 03/22/20 0510  . HYDROmorphone (DILAUDID) injection 0.5 mg  0.5 mg Intravenous Q4H PRN Domenic Polite, MD   0.5 mg at 03/18/20 1841  . insulin aspart (novoLOG) injection 0-5 Units  0-5 Units Subcutaneous QHS Chotiner, Yevonne Aline, MD      . insulin aspart (novoLOG) injection 0-9 Units  0-9 Units Subcutaneous TID WC Chotiner, Yevonne Aline, MD   1 Units at 03/21/20 1743  .  multivitamin with minerals tablet 1 tablet  1 tablet Oral Daily Modena Jansky, MD   1 tablet at 03/22/20 0859  . ondansetron (ZOFRAN) injection 4 mg  4 mg Intravenous Q6H PRN Domenic Polite, MD      . Derrill Memo ON 03/23/2020] thiamine (B-1) 250 mg in sodium chloride 0.9 % 50 mL IVPB  250 mg Intravenous Daily Kirby-Graham, Karsten Fells, NP      . thiamine 500mg  in normal saline (85ml) IVPB  500 mg Intravenous Q8H Kirby-Graham, Karsten Fells, NP 100 mL/hr at 03/22/20 0510 500 mg at 03/22/20 0510    Allergies as of 03/16/2020 - Review Complete 03/08/2020  Allergen Reaction Noted  . Oxycodone hcl Nausea And Vomiting 11/24/2010  . Penicillins Itching 11/24/2010     Review of Systems:    Constitutional: No fever or chills Skin: No rash  Cardiovascular: No chest pain Respiratory: No SOB  Gastrointestinal: See HPI and otherwise negative Genitourinary: No dysuria  Neurological: No headache, dizziness or syncope Musculoskeletal: No new muscle or joint pain Hematologic: No bleeding  Psychiatric: No history of depression or anxiety    Physical Exam:  Vital signs in last 24 hours: Temp:  [97.7 F (36.5 C)-98.1 F (36.7 C)] 97.7 F (36.5 C) (12/17 0331) Pulse Rate:  [76-94] 76 (12/17 0331) Resp:  [17] 17 (12/17 0331) BP: (97-121)/(68-105) 121/78 (12/17 0331) SpO2:  [98 %-100 %] 99 % (12/17 0331)   General:   Pleasant AA female appears to be in NAD, Well developed, Well nourished, alert and cooperative Head:  Normocephalic and atraumatic. Eyes:   PEERL, EOMI. No icterus. Conjunctiva pink. Ears:  Normal auditory acuity. Neck:  Supple Throat: Oral cavity and pharynx without inflammation, swelling or lesion. Lungs: Respirations even and unlabored. Lungs clear to auscultation bilaterally.   No wheezes, crackles, or rhonchi.  Heart: Normal S1, S2. No MRG. Regular rate and rhythm. No peripheral edema, cyanosis or pallor.  Abdomen:  Soft, nondistended, nontender. No rebound or guarding. Normal bowel  sounds. No appreciable masses or  hepatomegaly. Rectal:  Not performed.  Msk:  Symmetrical without gross deformities. Peripheral pulses intact.  Extremities:  Without edema, no deformity or joint abnormality.  Neurologic:  Alert and  oriented x4;  grossly normal neurologically.  Skin:   Dry and intact without significant lesions or rashes. Psychiatric: Demonstrates good judgement and reason without abnormal affect or behaviors.   LAB RESULTS: Recent Labs    03/20/20 0338 03/21/20 0252 03/22/20 0034  WBC 5.5 4.9 5.6  HGB 10.8* 9.5* 9.5*  HCT 33.9* 29.6* 30.0*  PLT 131* 129* 110*   BMET Recent Labs    03/20/20 0338 03/21/20 0252 03/22/20 0034  NA 150* 149* 146*  K 3.3* 3.2* 3.7  CL 111 114* 112*  CO2 27 24 25   GLUCOSE 114* 106* 107*  BUN 53* 40* 29*  CREATININE 1.59* 1.47* 1.35*  CALCIUM 9.0 8.8* 8.4*   STUDIES: CT CERVICAL SPINE WO CONTRAST  Result Date: 03/20/2020 CLINICAL DATA:  Myelopathy EXAM: CT CERVICAL SPINE WITHOUT CONTRAST TECHNIQUE: Multidetector CT imaging of the cervical spine was performed without intravenous contrast. Multiplanar CT image reconstructions were also generated. COMPARISON:  Cervical spine MRI 03/20/2020 FINDINGS: Alignment: Grade 1 retrolisthesis at C3-4 and C4-5. Skull base and vertebrae: No acute fracture. No primary bone lesion or focal pathologic process. Soft tissues and spinal canal: No prevertebral fluid or swelling. No visible canal hematoma. Disc levels: C2-3: Spondylosis without high-grade spinal canal or neural foraminal stenosis. C3-4: Large uncovertebral osteophytes with severe bilateral foraminal stenosis. Moderate spinal canal stenosis. C4-5: Uncovertebral osteophytes with severe bilateral foraminal stenosis. Moderate spinal canal stenosis. C5-6: Disc space narrowing with small uncovertebral osteophytes. No spinal canal stenosis. Mild foraminal stenosis bilaterally. C6-7: No spinal canal or neural foraminal stenosis. Upper chest:  Negative. Other: None. IMPRESSION: 1. Moderate spinal canal stenosis and severe bilateral foraminal stenosis at C3-4 and C4-5. 2. No acute fracture or static subluxation of the cervical spine. Electronically Signed   By: Ulyses Jarred M.D.   On: 03/20/2020 19:09   MR CERVICAL SPINE WO CONTRAST  Result Date: 03/20/2020 CLINICAL DATA:  Cervical radiculopathy rule out infection EXAM: MRI CERVICAL SPINE WITHOUT CONTRAST TECHNIQUE: Multiplanar, multisequence MR imaging of the cervical spine was performed. No intravenous contrast was administered. COMPARISON:  None. FINDINGS: Alignment: Image quality degraded by extensive motion. The patient was not able to complete the study or hold still. 3 mm retrolisthesis C3-4 and C4-5.  Mild retrolisthesis C5-6. Vertebrae: Negative for fracture or mass. No bone marrow edema is present Cord: Cord evaluation limited by extensive motion. Posterior Fossa, vertebral arteries, paraspinal tissues: No soft tissue mass or fluid collection Disc levels: C2-3: Mild disc and facet degeneration.  Mild spinal stenosis C3-4: Disc degeneration with posterior spurring. Moderate spinal stenosis and moderate to severe foraminal encroachment bilaterally C4-5: Disc degeneration and spurring. Moderate spinal stenosis. Severe foraminal stenosis bilaterally C5-6: Disc degeneration and spurring. Mild spinal stenosis and moderate to severe foraminal stenosis bilaterally C6-7: Mild degenerative change.  Negative for stenosis C7-T1: Mild degenerative change.  Negative for stenosis. IMPRESSION: Limited study. The patient was not able to hold still or complete the study. Multilevel spondylosis causing significant spinal and foraminal stenosis at multiple levels. Recommend CT cervical spine for further evaluation. Electronically Signed   By: Franchot Gallo M.D.   On: 03/20/2020 17:00     Impression / Plan:   Impression: 1.  Dysphagia: Feels like food is getting stuck on the way down, recent swallow eval  recommended GI and possible EGD, history of  stricture in 2019 which was dilated 2.  Nausea, vomiting: This seems to be getting some better and there is more of an issue with above 3.  Metabolic encephalopathy: Improved  Plan: 1.  Patient needs an EGD with likely dilation, scheduled with Dr. Collene Mares tomorrow.  Did discuss risks, benefits, limitations and alternatives and patient agrees to proceed. 2.  Continue current diet, n.p.o. after midnight. 3.  Started Pantoprazole 40 mg twice daily for now, pending results from EGD this can be stopped or changed. 4.  Please await any further recommendations from Dr. Silverio Decamp later today.  Thank you for your kind consultation, we will continue to follow.  [facility]  03/22/2020, 10:36 AM   Attending physician's note   I have taken a history, examined the patient and reviewed the chart. I agree with the Advanced Practitioner's note, impression and recommendations.  74 year old female with CHF, hypertension, CKD and type 2 diabetes with dysphagia, intractable nausea and vomiting  We will plan to proceed with EGD to further evaluate, esophageal dilation as needed.  Will be scheduled for tomorrow with Dr. Collene Mares Continue pantoprazole 40 mg twice daily Continue liquid diet as tolerated N.p.o. after midnight  Further recommendations based on findings on EGD  The risks and benefits as well as alternatives of endoscopic procedure(s) have been discussed and reviewed. All questions answered. The patient agrees to proceed.  The patient was provided an opportunity to ask questions and all were answered. The patient agreed with the plan and demonstrated an understanding of the instructions.  Damaris Hippo , MD [phone]

## 2020-03-22 NOTE — Progress Notes (Signed)
PROGRESS NOTE   [name] [name]  WIO:[id number]    DOB: 1945-10-19    DOA: 03/16/2020  PCP: Hoyt Koch, MD   I have briefly reviewed patients previous medical records in [facility].    Brief Narrative:  74 year old female with history of type 2 diabetes mellitus, chronic kidney disease stage IIIa, CHF, chronic lymphedema, hypertension, osteoarthritis who was brought to the ED by EMS.  Daughter reported that she had not been able to get in touch with her mom for a couple of days so she called the police to do a safety check on her and found her laying on the floor covered with stool and bugs crawling all over her.  Per chart review, it appears that she was recently seen by her PCP for intractable nausea and vomiting for 4 to 6 weeks associated with profound weakness, was also being treated for UTI, also seen in the ED on 1 such occasion, had a CT abdomen and pelvis 11/19 which was unremarkable.  Labs notable for hyponatremia, creatinine of 3.4, mildly elevated CK, lactate 2.1, abnormal urinalysis and CT head noted age-related atrophy.  She was admitted for acute metabolic encephalopathy. Mental status is gradually improved. Concerned that dysphagia is related to mechanical issues i.e. history of Schatzki's ring dilatation in the past. Thereby consulted [facility] who plan EGD on 12/18.   Assessment & Plan:  Principal Problem:   UTI (urinary tract infection) Active Problems:   HTN (hypertension)   DM type 2 (diabetes mellitus, type 2) (HCC)   Sepsis ([place])   Hypothermia   Acute kidney injury superimposed on chronic kidney disease ([place])   Delirium   Protein-calorie malnutrition, severe   Acute metabolic encephalopathy:  Suspected to be multifactorial from AKI, dehydration, hypernatremia and Wernicke's encephalopathy.  Urine culture with only 20 K colonies of Enterococcus, doubt that this is contributing but completed UTI treatment with 3 days of IV ceftriaxone from  12/11-12/13 followed by a dose of fosfomycin on 12/14.  CT head, ammonia level, TSH and a.m. cortisol were normal  MRI brain ordered due to slurred and extremely limited speech.  MRI brain from 12/13: Diffusion weighted and T2/flair hyperintense signal abnormality within the medial thalami, periaqueductal gray matter and dorsal medulla.  These findings are highly suggestive of Wernicke's encephalopathy.  Vitamin B1 level pending  Neurology was consulted and patient was started on high-dose IV thiamine.  As per neurology follow-up 12/15, patient is responding well to thiamine, her exam has improved compared to day prior.  They recommend continuing thiamine, even at discharge.  PT and OT recommend SNF at discharge  Neurology had requested MRI of the C-spine: Limited study due to lack of patient cooperation.  Multilevel spondylosis causing significant spinal and foraminal stenosis at multiple levels.    CT C-spine shows moderate cervical spinal canal stenosis and severe bilateral foraminal stenosis at C3-4 and C4-5.  No acute fracture or static subluxation.  Improving.  Discussed with Dr. Curly Shores, neuro hospitalist on 12/16 who recommended considering IM thiamine at discharge to complete course.  Mental status changes have improved and may have resolved. We will need to check with family.  Chronic nausea and vomiting:  Etiology unclear.  Recent CT abdomen and pelvis on 11/19 was unremarkable.  No culprit medications noted.  TSH and a.m. cortisol are normal.  As per SLP evaluation, history of Schatzki's ring dilatation in 2019.    [facility] consultation appreciated. Plan for EGD with likely dilatation on 12/18. Also initiated  Protonix 40 twice daily pending EGD results.  Dysphagia  SLP evaluated and placed on dysphagia 2 diet and thin liquids and suggested GI consultation.  [facility] consulted and plan EGD for 12/18.  Acute kidney injury complicating chronic kidney disease stage  IIIa  Secondary to dehydration with ongoing ARB and Lasix use.  ARB and Lasix held.  IV fluids changed to half-normal saline infusion after thiamine replacement, due to hypernatremia  Creatinine continues to steadily improve.  Creatinine down to 1.35  Trend daily BMP. Hopefully can stop IV fluids in the next 1 to 2 days  Dehydration with hypernatremia  Management as indicated above  Continues to gradually improve.  Sodium down to 146 today. Gradually improving.  Increase free water intake orally.  Hypokalemia/hypophosphatemia/hypomagnesemia  Has been replaced.  Anemia  Unclear etiology.  Some of this may be dilutional.  Hemoglobin dropped from 10.8-9.5,?  Dilutional. Hemoglobin stable in the 9 g range.  Thrombocytopenia  Unclear etiology. Continue to follow CBC  Left hip pain with movement  X-ray suggest moderate osteoarthritis  Supportive/symptomatic treatment.  Type II DM  A1c 6.8, adequate control in this elderly female  Metformin currently on hold.  Continue SSI.  Essential hypertension  Controlled off of antihypertensives  Holding losartan  Chronic lymphedema  No acute issues at this time.  Sacral decubitus ulcers  Secondary to moisture associated skin damage, partial-thickness wounds  Management per WOCN RN.  Body mass index is 35.87 kg/m.  Nutritional Status Nutrition Problem: Severe Malnutrition Etiology: social / environmental circumstances Signs/Symptoms: percent weight loss,mild fat depletion,moderate fat depletion,mild muscle depletion,moderate muscle depletion Percent weight loss: 6.4 % Interventions: Magic cup,MVI,Refer to RD note for recommendations    DVT prophylaxis: heparin injection 5,000 Units Start: 03/17/20 0600     Code Status: Full Code Family Communication: I discussed with patients eldest daughter via phone, updated care and answered questions. Disposition:  Status is: Inpatient  Remains inpatient appropriate  because:Inpatient level of care appropriate due to severity of illness   Dispo: The patient is from: Home              Anticipated d/c is to: SNF              Anticipated d/c date is: 2 days              Patient currently is not medically stable to d/c.        Consultants:   Neu[facility]  Procedures:   None  Antimicrobials:    Anti-infectives (From admission, onward)   Start     Dose/Rate Route Frequency Ordered Stop   03/19/20 1300  fosfomycin (MONUROL) packet 3 g        3 g Oral  Once 03/19/20 1159 03/19/20 1415   03/18/20 0600  cefTRIAXone (ROCEPHIN) 1 g in sodium chloride 0.9 % 100 mL IVPB  Status:  Discontinued        1 g 200 mL/hr over 30 Minutes Intravenous Every 24 hours 03/17/20 0519 03/19/20 1159   03/17/20 0415  cefTRIAXone (ROCEPHIN) 1 g in sodium chloride 0.9 % 100 mL IVPB        1 g 200 mL/hr over 30 Minutes Intravenous  Once 03/17/20 0406 03/17/20 0448        Subjective:  Seen this morning. Denied complaints. Wanted her breakfast tray to be moved from left side to the right side of her bed because she was right-handed.  As per daughter, still having issues with poor intake  due to swallowing issues.  Objective:   Vitals:   03/21/20 2044 03/22/20 0331 03/22/20 1355 03/22/20 1430  BP: (!) 117/105 121/78 95/70 115/79  Pulse: 88 76 (!) 108 92  Resp: 17 17 18 18   Temp: 98.1 F (36.7 C) 97.7 F (36.5 C) 98.1 F (36.7 C) 98.1 F (36.7 C)  TempSrc: Oral Oral Oral Oral  SpO2: 98% 99% 100% 100%  Weight:      Height:        General exam: Pleasant elderly female, moderately built and obese sitting up comfortably in bed getting ready to eat breakfast this morning. Respiratory system: Clear to auscultation.  No increased work of breathing. Cardiovascular system: S1 & S2 heard, RRR. No JVD, murmurs, rubs, gallops or clicks. No pedal edema. Gastrointestinal system: Abdomen is nondistended, soft and nontender. No organomegaly or masses felt. Normal  bowel sounds heard. Central nervous system: Alert and oriented x3. No focal neurological deficits.  Horizontal nystagmus is much improved compared to 3 days ago. Extremities: Symmetric 5 x 5 power. Skin: Chronic lower extremity skin changes from lymphedema-no acute findings. Psychiatry: Judgement and insight somewhat impaired but improved over last 3 days. Mood & affect pleasant and appropriate.     Data Reviewed:   I have personally reviewed following labs and imaging studies   CBC: Recent Labs  Lab 03/17/20 0002 03/17/20 0520 03/20/20 0338 03/21/20 0252 03/22/20 0034  WBC 8.4   < > 5.5 4.9 5.6  NEUTROABS 6.9  --   --   --   --   HGB 13.5   < > 10.8* 9.5* 9.5*  HCT 44.1   < > 33.9* 29.6* 30.0*  MCV 80.5   < > 79.8* 79.6* 80.2  PLT 231   < > 131* 129* 110*   < > = values in this interval not displayed.    Basic Metabolic Panel: Recent Labs  Lab 03/19/20 1506 03/20/20 0338 03/21/20 0252 03/22/20 0034  NA 153* 150* 149* 146*  K 3.3* 3.3* 3.2* 3.7  CL 111 111 114* 112*  CO2 25 27 24 25   GLUCOSE 126* 114* 106* 107*  BUN 58* 53* 40* 29*  CREATININE 1.64* 1.59* 1.47* 1.35*  CALCIUM 9.0 9.0 8.8* 8.4*  MG 1.8  --  1.6* 1.8  PHOS  --  1.8* 1.9* 3.4    Liver Function Tests: Recent Labs  Lab 03/17/20 0002 03/18/20 0414 03/19/20 0127  AST 63* 40 34  ALT 49* 33 30  ALKPHOS 51 46 46  BILITOT 0.9 0.9 0.8  PROT 7.4 6.2* 5.9*  ALBUMIN 3.3* 2.5* 2.4*    CBG: Recent Labs  Lab 03/21/20 2042 03/22/20 0743 03/22/20 1201  GLUCAP 121* 70 115*    Microbiology Studies:   Recent Results (from the past 240 hour(s))  Resp Panel by RT-PCR (Flu A&B, Covid) Nasopharyngeal Swab     Status: None   Collection Time: 03/16/20 11:13 PM   Specimen: Nasopharyngeal Swab; Nasopharyngeal(NP) swabs in vial transport medium  Result Value Ref Range Status   SARS Coronavirus 2 by RT PCR NEGATIVE NEGATIVE Final    Comment: (NOTE) SARS-CoV-2 target nucleic acids are NOT  DETECTED.  The SARS-CoV-2 RNA is generally detectable in upper respiratory specimens during the acute phase of infection. The lowest concentration of SARS-CoV-2 viral copies this assay can detect is 138 copies/mL. A negative result does not preclude SARS-Cov-2 infection and should not be used as the sole basis for treatment or other patient management decisions. A negative result  may occur with  improper specimen collection/handling, submission of specimen other than nasopharyngeal swab, presence of viral mutation(s) within the areas targeted by this assay, and inadequate number of viral copies(<138 copies/mL). A negative result must be combined with clinical observations, patient history, and epidemiological information. The expected result is Negative.  Fact Sheet for Patients:  EntrepreneurPulse.com.au  Fact Sheet for Healthcare Providers:  IncredibleEmployment.be  This test is no t yet approved or cleared by the [place] FDA and  has been authorized for detection and/or diagnosis of SARS-CoV-2 by FDA under an Emergency Use Authorization (EUA). This EUA will remain  in effect (meaning this test can be used) for the duration of the COVID-19 declaration under Section 564(b)(1) of the Act, 21 U.S.C.section 360bbb-3(b)(1), unless the authorization is terminated  or revoked sooner.       Influenza A by PCR NEGATIVE NEGATIVE Final   Influenza B by PCR NEGATIVE NEGATIVE Final    Comment: (NOTE) The Xpert Xpress SARS-CoV-2/FLU/RSV plus assay is intended as an aid in the diagnosis of influenza from Nasopharyngeal swab specimens and should not be used as a sole basis for treatment. Nasal washings and aspirates are unacceptable for Xpert Xpress SARS-CoV-2/FLU/RSV testing.  Fact Sheet for Patients: EntrepreneurPulse.com.au  Fact Sheet for Healthcare Providers: IncredibleEmployment.be  This test is not yet  approved or cleared by the [place] FDA and has been authorized for detection and/or diagnosis of SARS-CoV-2 by FDA under an Emergency Use Authorization (EUA). This EUA will remain in effect (meaning this test can be used) for the duration of the COVID-19 declaration under Section 564(b)(1) of the Act, 21 U.S.C. section 360bbb-3(b)(1), unless the authorization is terminated or revoked.  Performed at [facility], [place] [address] [place], [place] [address]   Blood culture (routine x 2)     Status: None   Collection Time: 03/17/20  1:04 AM   Specimen: BLOOD  Result Value Ref Range Status   Specimen Description BLOOD RIGHT ANTECUBITAL  Final   Special Requests   Final    BOTTLES DRAWN AEROBIC AND ANAEROBIC Blood Culture results may not be optimal due to an inadequate volume of blood received in culture bottles   Culture   Final    NO GROWTH 5 DAYS Performed at [facility], [place] [address], [place], [place] [address]    Report Status 03/22/2020 FINAL  Final  Blood culture (routine x 2)     Status: None   Collection Time: 03/17/20  2:07 AM   Specimen: BLOOD RIGHT HAND  Result Value Ref Range Status   Specimen Description BLOOD RIGHT HAND  Final   Special Requests   Final    BOTTLES DRAWN AEROBIC AND ANAEROBIC Blood Culture results may not be optimal due to an inadequate volume of blood received in culture bottles   Culture   Final    NO GROWTH 5 DAYS Performed at [facility], [place] [address], [place], [place] [address]    Report Status 03/22/2020 FINAL  Final  Urine culture     Status: Abnormal   Collection Time: 03/17/20 11:50 AM   Specimen: Urine, Random  Result Value Ref Range Status   Specimen Description URINE, RANDOM  Final   Special Requests   Final    NONE Performed at [facility], [place] [address], [place], [place] [address]    Culture 20,000 COLONIES/mL ENTEROCOCCUS FAECALIS (A)  Final   Report Status 03/19/2020 FINAL  Final  Organism  ID, Bacteria ENTEROCOCCUS FAECALIS (A)  Final      Susceptibility   Enterococcus faecalis - MIC*    AMPICILLIN <=2 SENSITIVE Sensitive     NITROFURANTOIN <=16 SENSITIVE Sensitive     VANCOMYCIN 2 SENSITIVE Sensitive     * 20,000 COLONIES/mL ENTEROCOCCUS FAECALIS     Radiology Studies:  CT CERVICAL SPINE WO CONTRAST  Result Date: 03/20/2020 CLINICAL DATA:  Myelopathy EXAM: CT CERVICAL SPINE WITHOUT CONTRAST TECHNIQUE: Multidetector CT imaging of the cervical spine was performed without intravenous contrast. Multiplanar CT image reconstructions were also generated. COMPARISON:  Cervical spine MRI 03/20/2020 FINDINGS: Alignment: Grade 1 retrolisthesis at C3-4 and C4-5. Skull base and vertebrae: No acute fracture. No primary bone lesion or focal pathologic process. Soft tissues and spinal canal: No prevertebral fluid or swelling. No visible canal hematoma. Disc levels: C2-3: Spondylosis without high-grade spinal canal or neural foraminal stenosis. C3-4: Large uncovertebral osteophytes with severe bilateral foraminal stenosis. Moderate spinal canal stenosis. C4-5: Uncovertebral osteophytes with severe bilateral foraminal stenosis. Moderate spinal canal stenosis. C5-6: Disc space narrowing with small uncovertebral osteophytes. No spinal canal stenosis. Mild foraminal stenosis bilaterally. C6-7: No spinal canal or neural foraminal stenosis. Upper chest: Negative. Other: None. IMPRESSION: 1. Moderate spinal canal stenosis and severe bilateral foraminal stenosis at C3-4 and C4-5. 2. No acute fracture or static subluxation of the cervical spine. Electronically Signed   By: Ulyses Jarred M.D.   On: 03/20/2020 19:09   MR CERVICAL SPINE WO CONTRAST  Result Date: 03/20/2020 CLINICAL DATA:  Cervical radiculopathy rule out infection EXAM: MRI CERVICAL SPINE WITHOUT CONTRAST TECHNIQUE: Multiplanar, multisequence MR imaging of the cervical spine was performed. No intravenous contrast was administered. COMPARISON:   None. FINDINGS: Alignment: Image quality degraded by extensive motion. The patient was not able to complete the study or hold still. 3 mm retrolisthesis C3-4 and C4-5.  Mild retrolisthesis C5-6. Vertebrae: Negative for fracture or mass. No bone marrow edema is present Cord: Cord evaluation limited by extensive motion. Posterior Fossa, vertebral arteries, paraspinal tissues: No soft tissue mass or fluid collection Disc levels: C2-3: Mild disc and facet degeneration.  Mild spinal stenosis C3-4: Disc degeneration with posterior spurring. Moderate spinal stenosis and moderate to severe foraminal encroachment bilaterally C4-5: Disc degeneration and spurring. Moderate spinal stenosis. Severe foraminal stenosis bilaterally C5-6: Disc degeneration and spurring. Mild spinal stenosis and moderate to severe foraminal stenosis bilaterally C6-7: Mild degenerative change.  Negative for stenosis C7-T1: Mild degenerative change.  Negative for stenosis. IMPRESSION: Limited study. The patient was not able to hold still or complete the study. Multilevel spondylosis causing significant spinal and foraminal stenosis at multiple levels. Recommend CT cervical spine for further evaluation. Electronically Signed   By: Franchot Gallo M.D.   On: 03/20/2020 17:00     Scheduled Meds:   . feeding supplement  237 mL Oral TID BM  . free water  100 mL Oral Q4H  . heparin  5,000 Units Subcutaneous Q8H  . insulin aspart  0-5 Units Subcutaneous QHS  . insulin aspart  0-9 Units Subcutaneous TID WC  . multivitamin with minerals  1 tablet Oral Daily  . pantoprazole  40 mg Oral BID AC    Continuous Infusions:   . sodium chloride 100 mL/hr at 03/22/20 1610  . [START ON 03/23/2020] thiamine injection       LOS: 5 days     Vernell Leep, MD, [place], [facility]. [facility]    To contact the attending provider between 7A-7P or the  covering provider during after hours 7P-7A, please log into the web site [email or web address] and access  using universal [place] password for that web site. If you do not have the password, please call the hospital operator.  03/22/2020, 4:24 PM

## 2020-03-23 ENCOUNTER — Inpatient Hospital Stay (HOSPITAL_COMMUNITY): Payer: PPO | Admitting: Certified Registered"

## 2020-03-23 ENCOUNTER — Encounter (HOSPITAL_COMMUNITY): Payer: Self-pay | Admitting: Family Medicine

## 2020-03-23 ENCOUNTER — Encounter (HOSPITAL_COMMUNITY)
Admission: EM | Disposition: A | Discharge status: Skilled Nursing Facility | Payer: Self-pay | Source: Home / Self Care | Attending: Internal Medicine

## 2020-03-23 HISTORY — PX: BIOPSY: SHX5522

## 2020-03-23 HISTORY — PX: ESOPHAGOGASTRODUODENOSCOPY (EGD) WITH PROPOFOL: SHX5813

## 2020-03-23 LAB — CBC
HCT: 31 % — ABNORMAL LOW (ref 36.0–46.0)
Hemoglobin: 9.5 g/dL — ABNORMAL LOW (ref 12.0–15.0)
MCH: 24.7 pg — ABNORMAL LOW (ref 26.0–34.0)
MCHC: 30.6 g/dL (ref 30.0–36.0)
MCV: 80.7 fL (ref 80.0–100.0)
Platelets: 95 10*3/uL — ABNORMAL LOW (ref 150–400)
RBC: 3.84 MIL/uL — ABNORMAL LOW (ref 3.87–5.11)
RDW: 20.1 % — ABNORMAL HIGH (ref 11.5–15.5)
WBC: 6.5 10*3/uL (ref 4.0–10.5)
nRBC: 0.9 % — ABNORMAL HIGH (ref 0.0–0.2)

## 2020-03-23 LAB — RENAL FUNCTION PANEL
Albumin: 2.1 g/dL — ABNORMAL LOW (ref 3.5–5.0)
Anion gap: 9 (ref 5–15)
BUN: 22 mg/dL (ref 8–23)
CO2: 27 mmol/L (ref 22–32)
Calcium: 8.6 mg/dL — ABNORMAL LOW (ref 8.9–10.3)
Chloride: 110 mmol/L (ref 98–111)
Creatinine, Ser: 1.26 mg/dL — ABNORMAL HIGH (ref 0.44–1.00)
GFR, Estimated: 45 mL/min — ABNORMAL LOW (ref >60)
Glucose, Bld: 89 mg/dL (ref 70–99)
Phosphorus: 2.6 mg/dL (ref 2.5–4.6)
Potassium: 4 mmol/L (ref 3.5–5.1)
Sodium: 146 mmol/L — ABNORMAL HIGH (ref 135–145)

## 2020-03-23 LAB — GLUCOSE, CAPILLARY
Glucose-Capillary: 96 mg/dL (ref 70–99)
Glucose-Capillary: 87 mg/dL (ref 70–99)
Glucose-Capillary: 81 mg/dL (ref 70–99)
Glucose-Capillary: 76 mg/dL (ref 70–99)
Glucose-Capillary: 83 mg/dL (ref 70–99)

## 2020-03-23 SURGERY — ESOPHAGOGASTRODUODENOSCOPY (EGD) WITH PROPOFOL
Anesthesia: Monitor Anesthesia Care

## 2020-03-23 MED ORDER — PROPOFOL 500 MG/50ML IV EMUL
INTRAVENOUS | Status: DC | PRN
  Administered 2020-03-23: 100 ug/kg/min via INTRAVENOUS

## 2020-03-23 MED ORDER — LIDOCAINE 2% (20 MG/ML) 5 ML SYRINGE
INTRAMUSCULAR | Status: DC | PRN
  Administered 2020-03-23: 40 mg via INTRAVENOUS

## 2020-03-23 MED ORDER — PROPOFOL 10 MG/ML IV BOLUS
INTRAVENOUS | Status: DC | PRN
  Administered 2020-03-23 (×2): 20 mg via INTRAVENOUS
  Administered 2020-03-23: 30 mg via INTRAVENOUS

## 2020-03-23 MED ORDER — PANTOPRAZOLE SODIUM 40 MG IV SOLR
40.0000 mg | Freq: Two times a day (BID) | INTRAVENOUS | Status: AC
  Administered 2020-03-23 – 2020-03-25 (×6): 40 mg via INTRAVENOUS
  Filled 2020-03-23 (×6): qty 40

## 2020-03-23 MED ORDER — SODIUM CHLORIDE 0.9 % IV SOLN: INTRAVENOUS | Status: DC

## 2020-03-23 MED ORDER — SUCRALFATE 1 GM/10ML PO SUSP
1.0000 g | Freq: Three times a day (TID) | ORAL | Status: AC
  Administered 2020-03-23 – 2020-04-06 (×54): 1 g via ORAL
  Filled 2020-03-23 (×55): qty 10

## 2020-03-23 MED ORDER — DEXTROSE 50 % IV SOLN
INTRAVENOUS | Status: AC
  Administered 2020-03-23: 25 mL
  Filled 2020-03-23: qty 50

## 2020-03-23 SURGICAL SUPPLY — 15 items

## 2020-03-23 NOTE — Progress Notes (Signed)
PROGRESS NOTE   [name] [name]  KYH:[id number]    DOB: 1945-04-10    DOA: 03/16/2020  PCP: Hoyt Koch, MD   I have briefly reviewed patients previous medical records in [facility].    Brief Narrative:  74 year old female with history of type 2 diabetes mellitus, chronic kidney disease stage IIIa, CHF, chronic lymphedema, hypertension, osteoarthritis who was brought to the ED by EMS.  Daughter reported that she had not been able to get in touch with her mom for a couple of days so she called the police to do a safety check on her and found her laying on the floor covered with stool and bugs crawling all over her.  Per chart review, it appears that she was recently seen by her PCP for intractable nausea and vomiting for 4 to 6 weeks associated with profound weakness, was also being treated for UTI, also seen in the ED on 1 such occasion, had a CT abdomen and pelvis 11/19 which was unremarkable.  Labs notable for hyponatremia, creatinine of 3.4, mildly elevated CK, lactate 2.1, abnormal urinalysis and CT head noted age-related atrophy.  She was admitted for acute metabolic encephalopathy. Mental status is gradually improved. Concerned that dysphagia is related to mechanical issues i.e. history of Schatzki's ring dilatation in the past.  S/p EGD 12/18: Reflux esophagitis, multiple large and small nonbleeding duodenal ulcers.   Assessment & Plan:  Principal Problem:   UTI (urinary tract infection) Active Problems:   HTN (hypertension)   DM type 2 (diabetes mellitus, type 2) (HCC)   Sepsis ([place])   Hypothermia   Acute kidney injury superimposed on chronic kidney disease ([place])   Delirium   Protein-calorie malnutrition, severe   Acute metabolic encephalopathy:  Suspected to be multifactorial from AKI, dehydration, hypernatremia and Wernicke's encephalopathy.  Urine culture with only 20 K colonies of Enterococcus, doubt that this is contributing but completed UTI treatment with  3 days of IV ceftriaxone from 12/11-12/13 followed by a dose of fosfomycin on 12/14.  CT head, ammonia level, TSH and a.m. cortisol were normal  MRI brain ordered due to slurred and extremely limited speech.  MRI brain from 12/13: Diffusion weighted and T2/flair hyperintense signal abnormality within the medial thalami, periaqueductal gray matter and dorsal medulla.  These findings are highly suggestive of Wernicke's encephalopathy.  Vitamin B1 level pending  Neurology was consulted and patient was started on high-dose IV thiamine.  As per neurology follow-up 12/15, patient is responding well to thiamine, her exam has improved compared to day prior.  They recommend continuing thiamine, even at discharge.  PT and OT recommend SNF at discharge  Neurology had requested MRI of the C-spine: Limited study due to lack of patient cooperation.  Multilevel spondylosis causing significant spinal and foraminal stenosis at multiple levels.    CT C-spine shows moderate cervical spinal canal stenosis and severe bilateral foraminal stenosis at C3-4 and C4-5.  No acute fracture or static subluxation.  Improving.  Discussed with Dr. Curly Shores, neuro hospitalist on 12/16 who recommended considering IM thiamine at discharge to complete course.  Resolved.  Confirmed with daughter yesterday.  Chronic nausea and vomiting:  Etiology unclear.  Recent CT abdomen and pelvis on 11/19 was unremarkable.  No culprit medications noted.  TSH and a.m. cortisol are normal.  As per SLP evaluation, history of Schatzki's ring dilatation in 2019.    [facility] consultation appreciated.   S/p EGD 12/18: LA grade C reflux esophagitis in the distal one thirds of  the esophagus, no stricture noted.  Normal stomach.  Multiple large and small nonbleeding duodenal ulcers with pigmented material in the bulb and post bulbar area.  As per discussion with GI, IV PPI twice daily x3 days, sucralfate suspension, await pathology results  and may need gastrin level checked when off of PPIs in the future.  Dysphagia  SLP evaluated and placed on dysphagia 2 diet and thin liquids and suggested GI consultation.  Evaluation and management as above.  Acute kidney injury complicating chronic kidney disease stage IIIa  Secondary to dehydration with ongoing ARB and Lasix use.  ARB and Lasix held.  IV fluids changed to half-normal saline infusion after thiamine replacement, due to hypernatremia  Creatinine continues to steadily improve.  Creatinine down to 1.26  Discontinued IV fluids.  Dehydration with hypernatremia  Management as indicated above  Continues to gradually improve.  Sodium down to 146 and stable.  Increase free water intake orally.  Hypokalemia/hypophosphatemia/hypomagnesemia  Has been replaced.  Anemia  Unclear etiology.  Some of this may be dilutional.  Hemoglobin dropped from 10.8-9.5,?  Dilutional. Hemoglobin stable in the 9 g range.  Thrombocytopenia  Unclear etiology and continues to drift down. Continue to follow CBC  Left hip pain with movement  X-ray suggest moderate osteoarthritis  Supportive/symptomatic treatment.  Type II DM  A1c 6.8, adequate control in this elderly female  Metformin currently on hold.  Continue SSI.  Essential hypertension  Controlled off of antihypertensives  Holding losartan  Chronic lymphedema  No acute issues at this time.  Sacral decubitus ulcers  Secondary to moisture associated skin damage, partial-thickness wounds  Management per WOCN RN.  Body mass index is 35.87 kg/m.  Nutritional Status Nutrition Problem: Severe Malnutrition Etiology: social / environmental circumstances Signs/Symptoms: percent weight loss,mild fat depletion,moderate fat depletion,mild muscle depletion,moderate muscle depletion Percent weight loss: 6.4 % Interventions: Magic cup,MVI,Refer to RD note for recommendations    DVT prophylaxis: heparin  injection 5,000 Units Start: 03/17/20 0600     Code Status: Full Code Family Communication: I discussed with patients eldest daughter via phone on 12/17, updated care and answered questions.  None at bedside today. Disposition:  Status is: Inpatient  Remains inpatient appropriate because:Inpatient level of care appropriate due to severity of illness   Dispo: The patient is from: Home              Anticipated d/c is to: SNF              Anticipated d/c date is: 2 days              Patient currently is not medically stable to d/c.        Consultants:   Neurology [facility]  Procedures:   None  Antimicrobials:    Anti-infectives (From admission, onward)   Start     Dose/Rate Route Frequency Ordered Stop   03/19/20 1300  fosfomycin (MONUROL) packet 3 g        3 g Oral  Once 03/19/20 1159 03/19/20 1415   03/18/20 0600  cefTRIAXone (ROCEPHIN) 1 g in sodium chloride 0.9 % 100 mL IVPB  Status:  Discontinued        1 g 200 mL/hr over 30 Minutes Intravenous Every 24 hours 03/17/20 0519 03/19/20 1159   03/17/20 0415  cefTRIAXone (ROCEPHIN) 1 g in sodium chloride 0.9 % 100 mL IVPB        1 g 200 mL/hr over 30 Minutes Intravenous  Once 03/17/20 0406 03/17/20 0448  Subjective:  Patient seen post procedure.  Has tolerated a bowl of clear liquid soup.  No complaints.  No abdominal pain.  Objective:   Vitals:   03/23/20 1050 03/23/20 1105 03/23/20 1200 03/23/20 1443  BP: 113/76 122/72 123/82 122/67  Pulse: 90 86 79 84  Resp: (!) 24 12 16 18   Temp: (!) 97.3 F (36.3 C) (!) 97.3 F (36.3 C) 97.9 F (36.6 C) 98.4 F (36.9 C)  TempSrc:   Oral Oral  SpO2: 96% 97% 99% 98%  Weight:      Height:        General exam: Pleasant elderly female, moderately built and obese lying comfortably propped up in bed. Respiratory system: Clear to auscultation.  No increased work of breathing. Cardiovascular system: S1 & S2 heard, RRR. No JVD, murmurs, rubs, gallops or clicks. No pedal  edema. Gastrointestinal system: Abdomen is nondistended, soft and nontender.  Normal bowel sounds heard. Central nervous system: Alert and oriented x3. No focal neurological deficits.  Horizontal nystagmus is much improved compared to 3 days ago. Extremities: Symmetric 5 x 5 power. Skin: Chronic lower extremity skin changes from lymphedema-no acute findings. Psychiatry: Judgement and insight improved and intact. Mood & affect pleasant and appropriate.     Data Reviewed:   I have personally reviewed following labs and imaging studies   CBC: Recent Labs  Lab 03/17/20 0002 03/17/20 0520 03/21/20 0252 03/22/20 0034 03/23/20 0402  WBC 8.4   < > 4.9 5.6 6.5  NEUTROABS 6.9  --   --   --   --   HGB 13.5   < > 9.5* 9.5* 9.5*  HCT 44.1   < > 29.6* 30.0* 31.0*  MCV 80.5   < > 79.6* 80.2 80.7  PLT 231   < > 129* 110* 95*   < > = values in this interval not displayed.    Basic Metabolic Panel: Recent Labs  Lab 03/19/20 1506 03/20/20 0338 03/21/20 0252 03/22/20 0034 03/23/20 0402  NA 153*   < > 149* 146* 146*  K 3.3*   < > 3.2* 3.7 4.0  CL 111   < > 114* 112* 110  CO2 25   < > 24 25 27   GLUCOSE 126*   < > 106* 107* 89  BUN 58*   < > 40* 29* 22  CREATININE 1.64*   < > 1.47* 1.35* 1.26*  CALCIUM 9.0   < > 8.8* 8.4* 8.6*  MG 1.8  --  1.6* 1.8  --   PHOS  --    < > 1.9* 3.4 2.6   < > = values in this interval not displayed.    Liver Function Tests: Recent Labs  Lab 03/17/20 0002 03/18/20 0414 03/19/20 0127 03/23/20 0402  AST 63* 40 34  --   ALT 49* 33 30  --   ALKPHOS 51 46 46  --   BILITOT 0.9 0.9 0.8  --   PROT 7.4 6.2* 5.9*  --   ALBUMIN 3.3* 2.5* 2.4* 2.1*    CBG: Recent Labs  Lab 03/23/20 0933 03/23/20 1123 03/23/20 1148  GLUCAP 96 87 81    Microbiology Studies:   Recent Results (from the past 240 hour(s))  Resp Panel by RT-PCR (Flu A&B, Covid) Nasopharyngeal Swab     Status: None   Collection Time: 03/16/20 11:13 PM   Specimen: Nasopharyngeal Swab;  Nasopharyngeal(NP) swabs in vial transport medium  Result Value Ref Range Status   SARS Coronavirus 2 by RT  PCR NEGATIVE NEGATIVE Final    Comment: (NOTE) SARS-CoV-2 target nucleic acids are NOT DETECTED.  The SARS-CoV-2 RNA is generally detectable in upper respiratory specimens during the acute phase of infection. The lowest concentration of SARS-CoV-2 viral copies this assay can detect is 138 copies/mL. A negative result does not preclude SARS-Cov-2 infection and should not be used as the sole basis for treatment or other patient management decisions. A negative result may occur with  improper specimen collection/handling, submission of specimen other than nasopharyngeal swab, presence of viral mutation(s) within the areas targeted by this assay, and inadequate number of viral copies(<138 copies/mL). A negative result must be combined with clinical observations, patient history, and epidemiological information. The expected result is Negative.  Fact Sheet for Patients:  EntrepreneurPulse.com.au  Fact Sheet for Healthcare Providers:  IncredibleEmployment.be  This test is no t yet approved or cleared by the [place] FDA and  has been authorized for detection and/or diagnosis of SARS-CoV-2 by FDA under an Emergency Use Authorization (EUA). This EUA will remain  in effect (meaning this test can be used) for the duration of the COVID-19 declaration under Section 564(b)(1) of the Act, 21 U.S.C.section 360bbb-3(b)(1), unless the authorization is terminated  or revoked sooner.       Influenza A by PCR NEGATIVE NEGATIVE Final   Influenza B by PCR NEGATIVE NEGATIVE Final    Comment: (NOTE) The Xpert Xpress SARS-CoV-2/FLU/RSV plus assay is intended as an aid in the diagnosis of influenza from Nasopharyngeal swab specimens and should not be used as a sole basis for treatment. Nasal washings and aspirates are unacceptable for Xpert Xpress  SARS-CoV-2/FLU/RSV testing.  Fact Sheet for Patients: EntrepreneurPulse.com.au  Fact Sheet for Healthcare Providers: IncredibleEmployment.be  This test is not yet approved or cleared by the [place] FDA and has been authorized for detection and/or diagnosis of SARS-CoV-2 by FDA under an Emergency Use Authorization (EUA). This EUA will remain in effect (meaning this test can be used) for the duration of the COVID-19 declaration under Section 564(b)(1) of the Act, 21 U.S.C. section 360bbb-3(b)(1), unless the authorization is terminated or revoked.  Performed at [facility], [place] [address] [place], [place] [address]   Blood culture (routine x 2)     Status: None   Collection Time: 03/17/20  1:04 AM   Specimen: BLOOD  Result Value Ref Range Status   Specimen Description BLOOD RIGHT ANTECUBITAL  Final   Special Requests   Final    BOTTLES DRAWN AEROBIC AND ANAEROBIC Blood Culture results may not be optimal due to an inadequate volume of blood received in culture bottles   Culture   Final    NO GROWTH 5 DAYS Performed at [facility], [place] [address] [place], [place] [address]    Report Status 03/22/2020 FINAL  Final  Blood culture (routine x 2)     Status: None   Collection Time: 03/17/20  2:07 AM   Specimen: BLOOD RIGHT HAND  Result Value Ref Range Status   Specimen Description BLOOD RIGHT HAND  Final   Special Requests   Final    BOTTLES DRAWN AEROBIC AND ANAEROBIC Blood Culture results may not be optimal due to an inadequate volume of blood received in culture bottles   Culture   Final    NO GROWTH 5 DAYS Performed at [facility], [place] [address], [place], [place] [address]    Report Status 03/22/2020 FINAL  Final  Urine culture  Status: Abnormal   Collection Time: 03/17/20 11:50 AM   Specimen: Urine, Random  Result Value Ref Range Status   Specimen Description URINE, RANDOM  Final   Special Requests   Final     NONE Performed at [facility], [address], [place], [place] [address]    Culture 20,000 COLONIES/mL ENTEROCOCCUS FAECALIS (A)  Final   Report Status 03/19/2020 FINAL  Final   Organism ID, Bacteria ENTEROCOCCUS FAECALIS (A)  Final      Susceptibility   Enterococcus faecalis - MIC*    AMPICILLIN <=2 SENSITIVE Sensitive     NITROFURANTOIN <=16 SENSITIVE Sensitive     VANCOMYCIN 2 SENSITIVE Sensitive     * 20,000 COLONIES/mL ENTEROCOCCUS FAECALIS     Radiology Studies:  No results found.   Scheduled Meds:   . feeding supplement  237 mL Oral TID BM  . free water  100 mL Oral Q4H  . heparin  5,000 Units Subcutaneous Q8H  . insulin aspart  0-5 Units Subcutaneous QHS  . insulin aspart  0-9 Units Subcutaneous TID WC  . multivitamin with minerals  1 tablet Oral Daily  . pantoprazole (PROTONIX) IV  40 mg Intravenous Q12H  . sucralfate  1 g Oral TID WC & HS    Continuous Infusions:   . sodium chloride 100 mL/hr at 03/23/20 0436  . thiamine injection 250 mg (03/23/20 1220)     LOS: 6 days     Vernell Leep, MD, [place], [facility]. [facility]    To contact the attending provider between 7A-7P or the covering provider during after hours 7P-7A, please log into the web site [email or web address] and access using universal [place] password for that web site. If you do not have the password, please call the hospital operator.  03/23/2020, 4:06 PM

## 2020-03-23 NOTE — TOC Progression Note (Signed)
Transition of Care [facility]) - Progression Note    Patient Details  Name: [name] [name] MRN: [id number] Date of Birth: Nov 17, 1945  Transition of Care [facility]) CM/SW [place], [place] Phone Number: [phone] 03/23/2020, 3:39 PM  Clinical Narrative:     CSW spoke with patient's daughter Renaye Rakers and provided her with all the bed offers as well as the rating. Renaye Rakers stated that she would give CSW a call back tomorrow. CSW confirmed with MD of a potential DC date of 12/20 which CSW shared with [name].  TOC team will continue to assist with discharge planning needs.  Expected Discharge Plan: [place] Barriers to Discharge: Continued Medical Work up  Expected Discharge Plan and Services Expected Discharge Plan: [place] In-house Referral: Clinical Social Work Discharge Planning Services: CM Consult   Living arrangements for the past 2 months: Single Family Home                                       Social Determinants of Health (SDOH) Interventions    Readmission Risk Interventions No flowsheet data found.

## 2020-03-23 NOTE — Anesthesia Postprocedure Evaluation (Signed)
Anesthesia Post Note  Patient: [name] [name]  Procedure(s) Performed: ESOPHAGOGASTRODUODENOSCOPY (EGD) WITH PROPOFOL (N/A ) BIOPSY     Patient location during evaluation: PACU Anesthesia Type: MAC Level of consciousness: awake and alert Pain management: pain level controlled Vital Signs Assessment: post-procedure vital signs reviewed and stable Respiratory status: spontaneous breathing Cardiovascular status: stable Anesthetic complications: no   No complications documented.  Last Vitals:  Vitals:   03/23/20 1443 03/23/20 2021  BP: 122/67 108/79  Pulse: 84 100  Resp: 18 18  Temp: 36.9 C 36.6 C  SpO2: 98% 100%    Last Pain:  Vitals:   03/23/20 1443  TempSrc: Oral  PainSc:                  Nolon Nations

## 2020-03-23 NOTE — Transfer of Care (Signed)
Immediate Anesthesia Transfer of Care Note  Patient: [name] [name]  Procedure(s) Performed: ESOPHAGOGASTRODUODENOSCOPY (EGD) WITH PROPOFOL (N/A ) BIOPSY  Patient Location: PACU  Anesthesia Type:MAC  Level of Consciousness: drowsy and patient cooperative  Airway & Oxygen Therapy: Patient Spontanous Breathing and Patient connected to nasal cannula oxygen  Post-op Assessment: Report given to RN and Post -op Vital signs reviewed and stable  Post vital signs: Reviewed and stable  Last Vitals:  Vitals Value Taken Time  BP 113/76 03/23/20 1051  Temp    Pulse 91 03/23/20 1052  Resp 24 03/23/20 1052  SpO2 96 % 03/23/20 1052  Vitals shown include unvalidated device data.  Last Pain:  Vitals:   03/23/20 0951  TempSrc: Temporal  PainSc: 4       Patients Stated Pain Goal: 1 (04/07/70 5366)  Complications: No complications documented.

## 2020-03-23 NOTE — Anesthesia Preprocedure Evaluation (Addendum)
Anesthesia Evaluation  Patient identified by MRN, date of birth, ID band Patient awake    Reviewed: Allergy & Precautions, NPO status , Patient's Chart, lab work & pertinent test results  Airway Mallampati: II  TM Distance: >3 FB Neck ROM: Full    Dental  (+) Poor Dentition, Chipped, Missing, Dental Advisory Given   Pulmonary pneumonia, former smoker,    Pulmonary exam normal breath sounds clear to auscultation       Cardiovascular hypertension, +CHF  Normal cardiovascular exam+ Valvular Problems/Murmurs  Rhythm:Regular Rate:Normal  Echo 03/2014 - Left ventricle: The cavity size was normal. There was mild concentric hypertrophy. Systolic function was normal. The estimated ejection fraction was in the range of 60% to 65%. Wall motion was normal; there were no regional wall motion abnormalities. Features are consistent with a pseudonormal left ventricular filling pattern, with concomitant abnormal relaxation and increased filling pressure (grade 2 diastolic dysfunction).  - Aortic valve: There was very mild stenosis. Valve area (VTI): 1.83 cm^2. Valve area (Vmax): 1.87 cm^2. Valve area (Vmean): 2 cm^2.  - Left atrium: The atrium was mildly dilated.     Neuro/Psych PSYCHIATRIC DISORDERS negative neurological ROS     GI/Hepatic Neg liver ROS, GERD  ,  Endo/Other  negative endocrine ROSdiabetes  Renal/GU Renal disease     Musculoskeletal  (+) Arthritis ,   Abdominal (+) + obese,   Peds  Hematology  (+) Blood dyscrasia, anemia ,   Anesthesia Other Findings   Reproductive/Obstetrics                            Anesthesia Physical Anesthesia Plan  ASA: III  Anesthesia Plan: MAC   Post-op Pain Management:    Induction: Intravenous  PONV Risk Score and Plan: 2 and Propofol infusion and Treatment may vary due to age or medical condition  Airway Management Planned: Natural Airway  Additional  Equipment: None  Intra-op Plan:   Post-operative Plan:   Informed Consent: I have reviewed the patients History and Physical, chart, labs and discussed the procedure including the risks, benefits and alternatives for the proposed anesthesia with the patient or authorized representative who has indicated his/her understanding and acceptance.     Dental advisory given  Plan Discussed with: CRNA  Anesthesia Plan Comments:        Anesthesia Quick Evaluation

## 2020-03-23 NOTE — Progress Notes (Signed)
Telephone consent received from  Rochelle ,RN and Olegario Shearer, RN

## 2020-03-23 NOTE — Op Note (Signed)
[facility] Patient Name: [name] [name] Procedure Date : 03/23/2020 MRN: [id number] Attending MD: Juanita Craver , MD Date of Birth: 1945/12/14 CSN: [id number] Age: 74 Admit Type: Inpatient Procedure:                EGD with antral biopsies. Indications:              Dysphagia, Gastro-esophageal reflux disease,                            Esophageal dysmotility on barium swallow from 2019. Providers:                Juanita Craver, MD, Baird Cancer, RN, Laverda Sorenson,                            Technician, Claybon Jabs CRNA Referring MD:              Medicines:                Monitored Anesthesia Care Complications:            No immediate complications. Estimated Blood Loss:     Estimated blood loss was minimal. Procedure:                Pre-Anesthesia Assessment: - Prior to the                            procedure, a history and physical was performed,                            and patient medications and allergies were                            reviewed. The patient's tolerance of previous                            anesthesia was also reviewed. The risks and                            benefits of the procedure and the sedation options                            and risks were discussed with the patient. All                            questions were answered, and informed consent was                            obtained. Prior Anticoagulants: The patient has                            taken no previous anticoagulant or antiplatelet                            agents. ASA Grade Assessment: III - A patient with  severe systemic disease. After reviewing the risks                            and benefits, the patient was deemed in                            satisfactory condition to undergo the procedure.                            After obtaining informed consent, the endoscope was                            passed under direct vision. Throughout the                             procedure, the patient's blood pressure, pulse, and                            oxygen saturations were monitored continuously. The                            GIF-H190 ([id number]) Olympus gastroscope was                            introduced through the mouth, and advanced to the                            second part of duodenum. The EGD was accomplished                            without difficulty. The patient tolerated the                            procedure well. Scope In: Scope Out: Findings:      LA Grade C (one or more mucosal breaks continuous between tops of 2 or       more mucosal folds, less than 75% circumference) esophagitis with no       bleeding was found in the distal 1/3 of the esophagus; no stricture or       Schatzki's ring identified.      The entire examined stomach was normal with some old hem ein tha       antrum-antral biopsies were done to rule out H. pylori by pathology.      The cardia and gastric fundus were normal on retroflexion.      Many large small non-bleeding superficial duodenal ulcers with pigmented       material were found in the duodenal bulb and in the first portion of the       duodenum; Impression:               - LA Grade C reflux esophagitis with no bleeding in                            the distal 1/3 of the esophagus; no stricture  identifed.                           - Normal appearing stomach; antral biopsies done to                            rule out H. pylori by pathology.                           - Multiple large and small non-bleeding duodenal                            ulcers with pigmented material in the bulb and                            post-bulbar area. Moderate Sedation:      MAC used. Recommendation:           - Full liquid diet today.                           - No Ibuprofen, Naproxen, or other non-steroidal                            anti-inflammatory drugs for now.                            - Use Protonix (Pantoprazole) 40 mg IV BID x 3 days.                           - Use sucralfate suspension 1 gram PO QID daily.                           - Await pathology results.                           - May need a gastrin level checked off of PPI's in                            the future. Procedure Code(s):        --- Professional ---                           [phone], Esophagogastroduodenoscopy, flexible,                            transoral; with biopsy, single or multiple Diagnosis Code(s):        --- Professional ---                           R13.10, Dysphagia, unspecified                           K26.9, Duodenal ulcer, unspecified as acute or                            chronic, without  hemorrhage or perforation                           K21.00, Gastro-esophageal reflux disease with                            esophagitis, without bleeding CPT copyright 2019 [employer]. All rights reserved. The codes documented in this report are preliminary and upon coder review may  be revised to meet current compliance requirements. Juanita Craver, MD Juanita Craver, MD 03/23/2020 11:04:59 AM This report has been signed electronically. Number of Addenda: 0

## 2020-03-24 ENCOUNTER — Encounter (HOSPITAL_COMMUNITY): Payer: Self-pay | Admitting: Gastroenterology

## 2020-03-24 LAB — RENAL FUNCTION PANEL
Albumin: 2.1 g/dL — ABNORMAL LOW (ref 3.5–5.0)
Anion gap: 10 (ref 5–15)
BUN: 18 mg/dL (ref 8–23)
CO2: 25 mmol/L (ref 22–32)
Calcium: 8.7 mg/dL — ABNORMAL LOW (ref 8.9–10.3)
Chloride: 109 mmol/L (ref 98–111)
Creatinine, Ser: 1.19 mg/dL — ABNORMAL HIGH (ref 0.44–1.00)
GFR, Estimated: 48 mL/min — ABNORMAL LOW (ref >60)
Glucose, Bld: 106 mg/dL — ABNORMAL HIGH (ref 70–99)
Phosphorus: 2.8 mg/dL (ref 2.5–4.6)
Potassium: 3.4 mmol/L — ABNORMAL LOW (ref 3.5–5.1)
Sodium: 144 mmol/L (ref 135–145)

## 2020-03-24 LAB — GLUCOSE, CAPILLARY
Glucose-Capillary: 105 mg/dL — ABNORMAL HIGH (ref 70–99)
Glucose-Capillary: 85 mg/dL (ref 70–99)
Glucose-Capillary: 88 mg/dL (ref 70–99)
Glucose-Capillary: 92 mg/dL (ref 70–99)
Glucose-Capillary: 97 mg/dL (ref 70–99)

## 2020-03-24 LAB — CBC
HCT: 29.4 % — ABNORMAL LOW (ref 36.0–46.0)
Hemoglobin: 9.1 g/dL — ABNORMAL LOW (ref 12.0–15.0)
MCH: 24.5 pg — ABNORMAL LOW (ref 26.0–34.0)
MCHC: 31 g/dL (ref 30.0–36.0)
MCV: 79.2 fL — ABNORMAL LOW (ref 80.0–100.0)
Platelets: 84 10*3/uL — ABNORMAL LOW (ref 150–400)
RBC: 3.71 MIL/uL — ABNORMAL LOW (ref 3.87–5.11)
RDW: 19.7 % — ABNORMAL HIGH (ref 11.5–15.5)
WBC: 6.3 10*3/uL (ref 4.0–10.5)
nRBC: 1 % — ABNORMAL HIGH (ref 0.0–0.2)

## 2020-03-24 LAB — PLATELET BY CITRATE: Platelet CT in Citrate: 55

## 2020-03-24 LAB — SAVE SMEAR(SSMR), FOR PROVIDER SLIDE REVIEW

## 2020-03-24 MED ORDER — BISACODYL 10 MG RE SUPP
10.0000 mg | Freq: Once | RECTAL | Status: AC
  Administered 2020-03-24: 10 mg via RECTAL
  Filled 2020-03-24: qty 1

## 2020-03-24 MED ORDER — FONDAPARINUX SODIUM 2.5 MG/0.5ML ~~LOC~~ SOLN
2.5000 mg | SUBCUTANEOUS | Status: DC
  Administered 2020-03-25 – 2020-03-26 (×3): 2.5 mg via SUBCUTANEOUS
  Filled 2020-03-24 (×4): qty 0.5

## 2020-03-24 MED ORDER — POLYETHYLENE GLYCOL 3350 17 G PO PACK
17.0000 g | PACK | Freq: Two times a day (BID) | ORAL | Status: DC
  Administered 2020-03-24 – 2020-04-05 (×19): 17 g via ORAL
  Filled 2020-03-24 (×21): qty 1

## 2020-03-24 MED ORDER — POTASSIUM CHLORIDE CRYS ER 20 MEQ PO TBCR
40.0000 meq | EXTENDED_RELEASE_TABLET | Freq: Once | ORAL | Status: AC
  Administered 2020-03-24: 40 meq via ORAL
  Filled 2020-03-24: qty 2

## 2020-03-24 MED ORDER — SENNA 8.6 MG PO TABS
2.0000 | ORAL_TABLET | Freq: Every day | ORAL | Status: DC
  Administered 2020-03-24 – 2020-04-04 (×9): 17.2 mg via ORAL
  Filled 2020-03-24 (×12): qty 2

## 2020-03-24 NOTE — Progress Notes (Signed)
[place] for fondaparinux Indication: VTE prophylaxis  Allergies  Allergen Reactions  . Oxycodone Hcl Nausea And Vomiting  . Penicillins Itching    Has patient had a PCN reaction causing immediate rash, facial/tongue/throat swelling, SOB or lightheadedness with hypotension: No Has patient had a PCN reaction causing severe rash involving mucus membranes or skin necrosis: No Has patient had a PCN reaction that required hospitalization: No Has patient had a PCN reaction occurring within the last 10 years: No If all of the above answers are "NO", then may proceed with Cephalosporin use.    Patient Measurements: Height: 5\' 10"  (177.8 cm) Weight: 113.4 kg (250 lb) IBW/kg (Calculated) : 68.5  Vital Signs: Temp: 98.3 F (36.8 C) (12/19 1356) Temp Source: Oral (12/19 1356) BP: 114/73 (12/19 1356) Pulse Rate: 101 (12/19 1356)  Labs: Recent Labs    03/22/20 0034 03/23/20 0402 03/24/20 0152  HGB 9.5* 9.5* 9.1*  HCT 30.0* 31.0* 29.4*  PLT 110* 95* 84*  CREATININE 1.35* 1.26* 1.19*    Estimated Creatinine Clearance: 57.5 mL/min (A) (by C-G formula based on SCr of 1.19 mg/dL (H)).   Medical History: Past Medical History:  Diagnosis Date  . Acute kidney injury ([place])   . Aortic stenosis, mild 11/17/2013  . Arthritis    "both knees, spine, back, fingers" (11/29/2013)  . CHF (congestive heart failure) ([place])   . Edema   . GERD (gastroesophageal reflux disease)   . Heart murmur   . History of diastolic dysfunction    Echo 0/7371 with diastolic dysfunction  . HTN (hypertension)   . Morbid obesity ([place])   . OA (osteoarthritis)   . PAC (premature atrial contraction)    per prior Holter  . Palpitations   . Pneumonia    "as a child"  . PVC's (premature ventricular contractions)    per prior Holter  . SVT (supraventricular tachycardia) ([place])   . Tachycardia    noted at 07/28/11 visit. Started on beta blocker. Possible atrial flutter vs long  PR tachycardia/AVNRT  . Type II diabetes mellitus ([facility])     Assessment: 56 yoF admitted with encephalopathy. Pt has been receiving SQ heparin for VTE prophylaxis, but now platelet count has trended down (206>>84) so pharmacy has been asked to transition to fondaparinux for VTE prophylaxis while HIT panel sent. Pt has CKD but CrCl ~55 ml/min.  Goal of Therapy:  DVT Prophylaxis Monitor platelets by anticoagulation protocol: Yes   Plan:  -Fondaparinux 2.5mg  SQ daily -Follow HIT antibody results >> if positive will need to adjust dosing  Arrie Senate, PharmD, BCPS, [facility] Clinical Pharmacist [phone] Please check AMION for all [facility] Pharmacy numbers 03/24/2020

## 2020-03-24 NOTE — Progress Notes (Signed)
[facility] Subjective: [name] [name] is a 74 year old black female with multiple medical problems including type 2 diabetes CHF/hypertension/osteoarthritis/chronic kidney disease stage III AAA and chronic lymphedema who was found disoriented by the policeman her daughter sent them out looking for her mother.  She was admitted for metabolic encephalopathy and had an EGD done yesterday for dysphagia and reflux and was noted to have distal esophagitis along with multiple ulcers in the duodenal bulb.  Antral biopsies were done for H. pylori results are pending.  Patient denies having any abdominal pain, nausea, vomiting, melena or hematochezia at the present time.    Objective: Vital signs in last 24 hours: Temp:  [97.1 F (36.2 C)-98.4 F (36.9 C)] 98 F (36.7 C) (12/19 0321) Pulse Rate:  [79-100] 96 (12/19 0321) Resp:  [12-24] 18 (12/19 0321) BP: (108-132)/(67-82) 112/81 (12/19 0321) SpO2:  [96 %-100 %] 100 % (12/19 0321)    Intake/Output from previous day: 12/18 0701 - 12/19 0700 In: 310 [P.O.:110; I.V.:200] Out: 550 [Urine:550] Intake/Output this shift: No intake/output data recorded.  General appearance: fatigued, no distress and slowed mentation with somewhat slurred speech Resp: clear to auscultation bilaterally Cardio: regular rate and rhythm, S1, S2 normal, no murmur, click, rub or gallop GI: soft, non-tender; bowel sounds normal; no masses,  no organomegaly Extremities: extremities normal, atraumatic, no cyanosis or edema  Lab Results: Recent Labs    03/22/20 0034 03/23/20 0402 03/24/20 0152  WBC 5.6 6.5 6.3  HGB 9.5* 9.5* 9.1*  HCT 30.0* 31.0* 29.4*  PLT 110* 95* 84*   BMET Recent Labs    03/22/20 0034 03/23/20 0402 03/24/20 0152  NA 146* 146* 144  K 3.7 4.0 3.4*  CL 112* 110 109  CO2 25 27 25   GLUCOSE 107* 89 106*  BUN 29* 22 18  CREATININE 1.35* 1.26* 1.19*  CALCIUM 8.4* 8.6* 8.7*   LFT Recent Labs    03/24/20 0152  ALBUMIN 2.1*    Medications: I have reviewed the patient's current medications.  Assessment/Plan: 1) Iron deficiency anemia with multiple duodenal ulcers on PPIs.  Antral biopsies done to rule out H. pylori by pathology-results pending.  It is important for the patient to avoid the use of all NSAID's for now.  Slight drop in hemoglobin from 9.5 g/dL 9.1 g/dL today but this could be dilutional. 2) History of dysphagia/esophageal dysmotility/GERD/reflux esophagitis noted on EGD done yesterday-on PPI's. 3) Acute metabolic encephalopathy thought to be multifactorial from dehydration acute kidney kidney injury and hyponatremia 4) MRI findings consistent with Wernicke's encephalopathy-high-dose thiamine has been started. 5) HTN/AODM. 6) acute kidney injury complicating chronic kidney disease stage IIIa. 7) Dehydration with hyponatremia. 8) Hypokalemia/hypophosphatemia/hypomagnesemia-being replaced. 9) Thrombocytopenia etiology unclear 10) 9Sacral decub decubitus ulcers/chronic lymphedema/left hip pain 11) Morbid obesity. Dr. Nicki Guadal[place]ara will follow in AM.  LOS: 7 days   [name] [name] 03/24/2020, 8:43 AM

## 2020-03-24 NOTE — Progress Notes (Addendum)
PROGRESS NOTE   [name] [name]  VQQ:[id number]    DOB: 12-26-1945    DOA: 03/16/2020  PCP: Hoyt Koch, MD   I have briefly reviewed patients previous medical records in [facility].    Brief Narrative:  74 year old female with history of type 2 diabetes mellitus, chronic kidney disease stage IIIa, CHF, chronic lymphedema, hypertension, osteoarthritis who was brought to the ED by EMS.  Daughter reported that she had not been able to get in touch with her mom for a couple of days so she called the police to do a safety check on her and found her laying on the floor covered with stool and bugs crawling all over her.  Per chart review, it appears that she was recently seen by her PCP for intractable nausea and vomiting for 4 to 6 weeks associated with profound weakness, was also being treated for UTI, also seen in the ED on 1 such occasion, had a CT abdomen and pelvis 11/19 which was unremarkable.  Labs notable for hyponatremia, creatinine of 3.4, mildly elevated CK, lactate 2.1, abnormal urinalysis and CT head noted age-related atrophy.  She was admitted for acute metabolic encephalopathy. Mental status is gradually improved. Concerned that dysphagia is related to mechanical issues i.e. history of Schatzki's ring dilatation in the past.  S/p EGD 12/18: Reflux esophagitis, multiple large and small nonbleeding duodenal ulcers.   Assessment & Plan:  Principal Problem:   UTI (urinary tract infection) Active Problems:   HTN (hypertension)   DM type 2 (diabetes mellitus, type 2) (HCC)   Sepsis ([place])   Hypothermia   Acute kidney injury superimposed on chronic kidney disease ([place])   Delirium   Protein-calorie malnutrition, severe   Acute metabolic encephalopathy:  Suspected to be multifactorial from AKI, dehydration, hypernatremia and Wernicke's encephalopathy.  Urine culture with only 20 K colonies of Enterococcus, doubt that this is contributing but completed UTI treatment with  3 days of IV ceftriaxone from 12/11-12/13 followed by a dose of fosfomycin on 12/14.  CT head, ammonia level, TSH, B12 and a.m. cortisol were normal  MRI brain ordered due to slurred and extremely limited speech.  MRI brain from 12/13: Diffusion weighted and T2/flair hyperintense signal abnormality within the medial thalami, periaqueductal gray matter and dorsal medulla.  These findings are highly suggestive of Wernicke's encephalopathy.  Vitamin B1 level pending as of 12/19  Neurology was consulted and patient was started on high-dose IV thiamine.  As per neurology follow-up 12/15, patient is responding well to thiamine, her exam improved.  They recommend continuing thiamine, even at discharge.  Can be switched to oral thiamine at discharge  PT and OT recommend SNF at discharge  Neurology had requested MRI of the C-spine: Limited study due to lack of patient cooperation.  Multilevel spondylosis causing significant spinal and foraminal stenosis at multiple levels.    CT C-spine shows moderate cervical spinal canal stenosis and severe bilateral foraminal stenosis at C3-4 and C4-5.  No acute fracture or static subluxation.  Resolved.  Confirmed with daughter.  Chronic nausea and vomiting:  Etiology unclear.  Recent CT abdomen and pelvis on 11/19 was unremarkable.  No culprit medications noted.  TSH and a.m. cortisol are normal.  As per SLP evaluation, history of Schatzki's ring dilatation in 2019.    [facility] consultation appreciated.   S/p EGD 12/18: LA grade C reflux esophagitis in the distal one thirds of the esophagus, no stricture noted.  Normal stomach.  Multiple large and small nonbleeding  duodenal ulcers with pigmented material in the bulb and post bulbar area.  As per discussion with GI, IV PPI twice daily x3 days, sucralfate suspension, await pathology results and may need gastrin level checked when off of PPIs in the future.  Encourage p.o. intake.  Dysphagia  SLP  evaluated and placed on dysphagia 2 diet and thin liquids and suggested GI consultation.  Evaluation and management as above.  Acute kidney injury complicating chronic kidney disease stage IIIa  Secondary to dehydration with ongoing ARB and Lasix use.  ARB and Lasix held.  IV fluids changed to half-normal saline infusion after thiamine replacement, due to hypernatremia  Creatinine continues to steadily improve.  Creatinine down to 1.26 >1.19  Discontinued IV fluids.  Dehydration with hypernatremia  Resolved after IV fluids.  However remains at risk for recurrence due to poor and inconsistent p.o. intake.  Hypokalemia/hypophosphatemia/hypomagnesemia  Replace and follow hypokalemia.  Anemia  Unclear etiology.  Some of this may be dilutional.  Hemoglobin dropped from 10.8-9.5,?  Dilutional. Hemoglobin stable in the 9 g range.  Thrombocytopenia  Unclear etiology and continues to drift down, 84 on 12/19.  Admission platelets: 231.  I discussed with Hematologist on call on 12/19: Could be related to infectious etiology early in admission, medications including antibiotics earlier on and has been on heparin DVT prophylaxis for greater than 5 days.  As per hematology recommendations, stopped heparin, switched to fondaparinux DVT prophylaxis, requested HIT antibodies and platelets in citrate (to rule out pseudothrombocytopenia from clumping).  Follow peripheral smear.  Monitor closely and if continues to worsen then contact back for further evaluation  Left hip pain with movement  X-ray suggest moderate osteoarthritis  Supportive/symptomatic treatment.  Type II DM  A1c 6.8, adequate control in this elderly female  Metformin currently on hold.  Continue SSI.  Essential hypertension  Controlled off of antihypertensives  Holding losartan  Chronic lymphedema  No acute issues at this time.  Sacral decubitus ulcers  Secondary to moisture associated skin damage,  partial-thickness wounds  Management per [facility] RN.  Seems to be worsening.  Examined with bedside RN on 12/19.  Request follow-up from [facility] RN.  Constipation  Dulcolax suppository and started MiraLAX and senna.  Body mass index is 35.87 kg/m.  Nutritional Status Nutrition Problem: Severe Malnutrition Etiology: social / environmental circumstances Signs/Symptoms: percent weight loss,mild fat depletion,moderate fat depletion,mild muscle depletion,moderate muscle depletion Percent weight loss: 6.4 % Interventions: Magic cup,MVI,Refer to RD note for recommendations    DVT prophylaxis: heparin injection 5,000 Units Start: 03/17/20 0600     Code Status: Full Code Family Communication: I discussed with patients eldest daughter via phone on 12/17, updated care and answered questions.  None at bedside today. Disposition:  Status is: Inpatient  Remains inpatient appropriate because:Inpatient level of care appropriate due to severity of illness   Dispo: The patient is from: Home              Anticipated d/c is to: SNF              Anticipated d/c date is: 2 days              Patient currently is not medically stable to d/c.        Consultants:   Neurology [facility]  Procedures:   None  Antimicrobials:    Anti-infectives (From admission, onward)   Start     Dose/Rate Route Frequency Ordered Stop   03/19/20 1300  fosfomycin (MONUROL) packet 3 g  3 g Oral  Once 03/19/20 1159 03/19/20 1415   03/18/20 0600  cefTRIAXone (ROCEPHIN) 1 g in sodium chloride 0.9 % 100 mL IVPB  Status:  Discontinued        1 g 200 mL/hr over 30 Minutes Intravenous Every 24 hours 03/17/20 0519 03/19/20 1159   03/17/20 0415  cefTRIAXone (ROCEPHIN) 1 g in sodium chloride 0.9 % 100 mL IVPB        1 g 200 mL/hr over 30 Minutes Intravenous  Once 03/17/20 0406 03/17/20 0448        Subjective:  Patient reports feeling uncomfortable and stiff in bed.  Intermittent pain at wound site.  No nausea,  vomiting or abdominal pain.  Tolerating diet but not sure how much she is eating.  Also states no BM for several days.  Objective:   Vitals:   03/23/20 1443 03/23/20 2021 03/24/20 0321 03/24/20 1356  BP: 122/67 108/79 112/81 114/73  Pulse: 84 100 96 (!) 101  Resp: 18 18 18 18   Temp: 98.4 F (36.9 C) 97.8 F (36.6 C) 98 F (36.7 C) 98.3 F (36.8 C)  TempSrc: Oral  Oral Oral  SpO2: 98% 100% 100% 99%  Weight:      Height:       Patient was examined with her 2 female RNs in the room for assistance and chaperone  General exam: Pleasant elderly female, moderately built and obese lying comfortably propped up in bed. Respiratory system: Clear to auscultation.  No increased work of breathing Cardiovascular system: S1 and S2 heard, RRR.  No JVD, murmurs or pedal edema. Gastrointestinal system: Abdomen is nondistended, soft and nontender.  Normal bowel sounds heard. Central nervous system: Alert and oriented x3. No focal neurological deficits.  Horizontal nystagmus is much improved compared to 3 days ago. Extremities: Symmetric 5 x 5 power. Skin: Chronic lower extremity skin changes from lymphedema-no acute findings. Sacral wounds as per picture below as examined on 12/19.  No overt infection. Psychiatry: Judgement and insight improved and intact. Mood & affect pleasant and appropriate.         Data Reviewed:   I have personally reviewed following labs and imaging studies   CBC: Recent Labs  Lab 03/22/20 0034 03/23/20 0402 03/24/20 0152  WBC 5.6 6.5 6.3  HGB 9.5* 9.5* 9.1*  HCT 30.0* 31.0* 29.4*  MCV 80.2 80.7 79.2*  PLT 110* 95* 84*    Basic Metabolic Panel: Recent Labs  Lab 03/19/20 1506 03/20/20 0338 03/21/20 0252 03/22/20 0034 03/23/20 0402 03/24/20 0152  NA 153*   < > 149* 146* 146* 144  K 3.3*   < > 3.2* 3.7 4.0 3.4*  CL 111   < > 114* 112* 110 109  CO2 25   < > 24 25 27 25   GLUCOSE 126*   < > 106* 107* 89 106*  BUN 58*   < > 40* 29* 22 18  CREATININE  1.64*   < > 1.47* 1.35* 1.26* 1.19*  CALCIUM 9.0   < > 8.8* 8.4* 8.6* 8.7*  MG 1.8  --  1.6* 1.8  --   --   PHOS  --    < > 1.9* 3.4 2.6 2.8   < > = values in this interval not displayed.    Liver Function Tests: Recent Labs  Lab 03/18/20 0414 03/19/20 0127 03/23/20 0402 03/24/20 0152  AST 40 34  --   --   ALT 33 30  --   --   ALKPHOS  46 46  --   --   BILITOT 0.9 0.8  --   --   PROT 6.2* 5.9*  --   --   ALBUMIN 2.5* 2.4* 2.1* 2.1*    CBG: Recent Labs  Lab 03/24/20 0044 03/24/20 0414 03/24/20 0724  GLUCAP 97 88 85    Microbiology Studies:   Recent Results (from the past 240 hour(s))  Resp Panel by RT-PCR (Flu A&B, Covid) Nasopharyngeal Swab     Status: None   Collection Time: 03/16/20 11:13 PM   Specimen: Nasopharyngeal Swab; Nasopharyngeal(NP) swabs in vial transport medium  Result Value Ref Range Status   SARS Coronavirus 2 by RT PCR NEGATIVE NEGATIVE Final    Comment: (NOTE) SARS-CoV-2 target nucleic acids are NOT DETECTED.  The SARS-CoV-2 RNA is generally detectable in upper respiratory specimens during the acute phase of infection. The lowest concentration of SARS-CoV-2 viral copies this assay can detect is 138 copies/mL. A negative result does not preclude SARS-Cov-2 infection and should not be used as the sole basis for treatment or other patient management decisions. A negative result may occur with  improper specimen collection/handling, submission of specimen other than nasopharyngeal swab, presence of viral mutation(s) within the areas targeted by this assay, and inadequate number of viral copies(<138 copies/mL). A negative result must be combined with clinical observations, patient history, and epidemiological information. The expected result is Negative.  Fact Sheet for Patients:  EntrepreneurPulse.com.au  Fact Sheet for Healthcare Providers:  IncredibleEmployment.be  This test is no t yet approved or cleared  by the [place] FDA and  has been authorized for detection and/or diagnosis of SARS-CoV-2 by FDA under an Emergency Use Authorization (EUA). This EUA will remain  in effect (meaning this test can be used) for the duration of the COVID-19 declaration under Section 564(b)(1) of the Act, 21 U.S.C.section 360bbb-3(b)(1), unless the authorization is terminated  or revoked sooner.       Influenza A by PCR NEGATIVE NEGATIVE Final   Influenza B by PCR NEGATIVE NEGATIVE Final    Comment: (NOTE) The Xpert Xpress SARS-CoV-2/FLU/RSV plus assay is intended as an aid in the diagnosis of influenza from Nasopharyngeal swab specimens and should not be used as a sole basis for treatment. Nasal washings and aspirates are unacceptable for Xpert Xpress SARS-CoV-2/FLU/RSV testing.  Fact Sheet for Patients: EntrepreneurPulse.com.au  Fact Sheet for Healthcare Providers: IncredibleEmployment.be  This test is not yet approved or cleared by the [place] FDA and has been authorized for detection and/or diagnosis of SARS-CoV-2 by FDA under an Emergency Use Authorization (EUA). This EUA will remain in effect (meaning this test can be used) for the duration of the COVID-19 declaration under Section 564(b)(1) of the Act, 21 U.S.C. section 360bbb-3(b)(1), unless the authorization is terminated or revoked.  Performed at [facility], [place] [address] [place], [place] [address]   Blood culture (routine x 2)     Status: None   Collection Time: 03/17/20  1:04 AM   Specimen: BLOOD  Result Value Ref Range Status   Specimen Description BLOOD RIGHT ANTECUBITAL  Final   Special Requests   Final    BOTTLES DRAWN AEROBIC AND ANAEROBIC Blood Culture results may not be optimal due to an inadequate volume of blood received in culture bottles   Culture   Final    NO GROWTH 5 DAYS Performed at [facility], [place] [address] [place], [place] [address]    Report  Status 03/22/2020 FINAL  Final  Blood culture (routine x 2)     Status: None   Collection Time: 03/17/20  2:07 AM   Specimen: BLOOD RIGHT HAND  Result Value Ref Range Status   Specimen Description BLOOD RIGHT HAND  Final   Special Requests   Final    BOTTLES DRAWN AEROBIC AND ANAEROBIC Blood Culture results may not be optimal due to an inadequate volume of blood received in culture bottles   Culture   Final    NO GROWTH 5 DAYS Performed at [facility], [place] [address] [place], [place] [address]    Report Status 03/22/2020 FINAL  Final  Urine culture     Status: Abnormal   Collection Time: 03/17/20 11:50 AM   Specimen: Urine, Random  Result Value Ref Range Status   Specimen Description URINE, RANDOM  Final   Special Requests   Final    NONE Performed at [facility], [place] [address], [place], [place] [address]    Culture 20,000 COLONIES/mL ENTEROCOCCUS FAECALIS (A)  Final   Report Status 03/19/2020 FINAL  Final   Organism ID, Bacteria ENTEROCOCCUS FAECALIS (A)  Final      Susceptibility   Enterococcus faecalis - MIC*    AMPICILLIN <=2 SENSITIVE Sensitive     NITROFURANTOIN <=16 SENSITIVE Sensitive     VANCOMYCIN 2 SENSITIVE Sensitive     * 20,000 COLONIES/mL ENTEROCOCCUS FAECALIS     Radiology Studies:  No results found.   Scheduled Meds:   . feeding supplement  237 mL Oral TID BM  . free water  100 mL Oral Q4H  . heparin  5,000 Units Subcutaneous Q8H  . insulin aspart  0-5 Units Subcutaneous QHS  . insulin aspart  0-9 Units Subcutaneous TID WC  . multivitamin with minerals  1 tablet Oral Daily  . pantoprazole (PROTONIX) IV  40 mg Intravenous Q12H  . polyethylene glycol  17 g Oral BID  . senna  2 tablet Oral Daily  . sucralfate  1 g Oral TID WC & HS    Continuous Infusions:   . thiamine injection 250 mg (03/24/20 1005)     LOS: 7 days     Vernell Leep, MD, [place], [facility]. [facility]    To contact the attending provider between 7A-7P or  the covering provider during after hours 7P-7A, please log into the web site [email or web address] and access using universal  password for that web site. If you do not have the password, please call the hospital operator.  03/24/2020, 2:50 PM

## 2020-03-25 DIAGNOSIS — K209 Esophagitis, unspecified without bleeding: Secondary | ICD-10-CM

## 2020-03-25 DIAGNOSIS — K269 Duodenal ulcer, unspecified as acute or chronic, without hemorrhage or perforation: Secondary | ICD-10-CM

## 2020-03-25 LAB — CBC
HCT: 29.6 % — ABNORMAL LOW (ref 36.0–46.0)
Hemoglobin: 9.2 g/dL — ABNORMAL LOW (ref 12.0–15.0)
MCH: 24.8 pg — ABNORMAL LOW (ref 26.0–34.0)
MCHC: 31.1 g/dL (ref 30.0–36.0)
MCV: 79.8 fL — ABNORMAL LOW (ref 80.0–100.0)
Platelets: 125 10*3/uL — ABNORMAL LOW (ref 150–400)
RBC: 3.71 MIL/uL — ABNORMAL LOW (ref 3.87–5.11)
RDW: 19.8 % — ABNORMAL HIGH (ref 11.5–15.5)
WBC: 8.9 10*3/uL (ref 4.0–10.5)
nRBC: 0.2 % (ref 0.0–0.2)

## 2020-03-25 LAB — BASIC METABOLIC PANEL
Anion gap: 10 (ref 5–15)
BUN: 18 mg/dL (ref 8–23)
CO2: 24 mmol/L (ref 22–32)
Calcium: 8.6 mg/dL — ABNORMAL LOW (ref 8.9–10.3)
Chloride: 109 mmol/L (ref 98–111)
Creatinine, Ser: 1.31 mg/dL — ABNORMAL HIGH (ref 0.44–1.00)
GFR, Estimated: 43 mL/min — ABNORMAL LOW (ref >60)
Glucose, Bld: 100 mg/dL — ABNORMAL HIGH (ref 70–99)
Potassium: 4.4 mmol/L (ref 3.5–5.1)
Sodium: 143 mmol/L (ref 135–145)

## 2020-03-25 LAB — PHOSPHORUS: Phosphorus: 2.8 mg/dL (ref 2.5–4.6)

## 2020-03-25 LAB — PATHOLOGIST SMEAR REVIEW

## 2020-03-25 LAB — GLUCOSE, CAPILLARY
Glucose-Capillary: 69 mg/dL — ABNORMAL LOW (ref 70–99)
Glucose-Capillary: 90 mg/dL (ref 70–99)
Glucose-Capillary: 123 mg/dL — ABNORMAL HIGH (ref 70–99)
Glucose-Capillary: 155 mg/dL — ABNORMAL HIGH (ref 70–99)

## 2020-03-25 LAB — HEPARIN INDUCED PLATELET AB (HIT ANTIBODY): Heparin Induced Plt Ab: 0.073 OD (ref 0.000–0.400)

## 2020-03-25 MED ORDER — PANTOPRAZOLE SODIUM 40 MG PO TBEC
40.0000 mg | DELAYED_RELEASE_TABLET | Freq: Two times a day (BID) | ORAL | Status: DC
  Administered 2020-03-26 – 2020-04-08 (×26): 40 mg via ORAL
  Filled 2020-03-25 (×27): qty 1

## 2020-03-25 NOTE — Consult Note (Signed)
[place] Nurse wound follow up Patient receiving care in [place] Wound type: worsening sacral wound Wound bed: red bleeding  Dressing procedure/placement/frequency: Cleanse the entire buttock area with no rinse cleanser. Pat dry. Apply Aquacel Advantage to the gluteal fold and secure with sacral foam. Change daily.  Monitor the wound area(s) for worsening of condition such as: Signs/symptoms of infection, increase in size, development of or worsening of odor, development of pain, or increased pain at the affected locations.   Notify the medical team if any of these develop.  Thank you for the consult. [place] nurse will not follow at this time.   Please re-consult the [place] team if needed.  Cathlean Marseilles Tamala Julian, MSN, RN, [place], [facility], [facility] Wound Treatment Associate Pager [phone]

## 2020-03-25 NOTE — Progress Notes (Signed)
PROGRESS NOTE   [name] [name]  RKY:[id number]    DOB: [birth year]    DOA: 03/16/2020  PCP: Hoyt Koch, MD   I have briefly reviewed patients previous medical records in [facility].    Brief Narrative:  74 year old female with history of type 2 diabetes mellitus, chronic kidney disease stage IIIa, CHF, chronic lymphedema, hypertension, osteoarthritis who was brought to the ED by EMS.  Daughter reported that she had not been able to get in touch with her mom for a couple of days so she called the police to do a safety check on her and found her laying on the floor covered with stool and bugs crawling all over her.  Per chart review, it appears that she was recently seen by her PCP for intractable nausea and vomiting for 4 to 6 weeks associated with profound weakness, was also being treated for UTI, also seen in the ED on 1 such occasion, had a CT abdomen and pelvis 11/19 which was unremarkable.  Labs notable for hyponatremia, creatinine of 3.4, mildly elevated CK, lactate 2.1, abnormal urinalysis and CT head noted age-related atrophy.  She was admitted for acute metabolic encephalopathy. Mental status is gradually improved. Concerned that dysphagia is related to mechanical issues i.e. history of Schatzki's ring dilatation in the past.  S/p EGD 12/18: Reflux esophagitis, multiple large and small nonbleeding duodenal ulcers.   Assessment & Plan:  Principal Problem:   UTI (urinary tract infection) Active Problems:   HTN (hypertension)   DM type 2 (diabetes mellitus, type 2) (HCC)   Sepsis ([place])   Hypothermia   Acute kidney injury superimposed on chronic kidney disease ([place])   Delirium   Protein-calorie malnutrition, severe   Duodenal ulcer   Acute esophagitis   Acute metabolic encephalopathy:  Suspected to be multifactorial from AKI, dehydration, hypernatremia and Wernicke's encephalopathy.  Urine culture with only 20 K colonies of Enterococcus, doubt that this is  contributing but completed UTI treatment with 3 days of IV ceftriaxone from 12/11-12/13 followed by a dose of fosfomycin on 12/14.  CT head, ammonia level, TSH, B12 and a.m. cortisol were normal  MRI brain ordered due to slurred and extremely limited speech.  MRI brain from 12/13: Diffusion weighted and T2/flair hyperintense signal abnormality within the medial thalami, periaqueductal gray matter and dorsal medulla.  These findings are highly suggestive of Wernicke's encephalopathy.  Vitamin B1 level pending as of 12/19  Neurology was consulted and patient was started on high-dose IV thiamine.  As per neurology follow-up 12/15, patient is responding well to thiamine, her exam improved.  They recommend continuing thiamine, even at discharge.  Can be switched to oral thiamine at discharge  PT and OT recommend SNF at discharge  Neurology had requested MRI of the C-spine: Limited study due to lack of patient cooperation.  Multilevel spondylosis causing significant spinal and foraminal stenosis at multiple levels.    CT C-spine shows moderate cervical spinal canal stenosis and severe bilateral foraminal stenosis at C3-4 and C4-5.  No acute fracture or static subluxation.  As per discussion with daughter, mental status change has significantly improved but not completely resolved.  Chronic nausea and vomiting:  Etiology unclear.  Recent CT abdomen and pelvis on 11/19 was unremarkable.  No culprit medications noted.  TSH and a.m. cortisol are normal.  As per SLP evaluation, history of Schatzki's ring dilatation in 2019.    [facility] consultation appreciated.   S/p EGD 12/18: LA grade C reflux esophagitis in the  distal one thirds of the esophagus, no stricture noted.  Normal stomach.  Multiple large and small nonbleeding duodenal ulcers with pigmented material in the bulb and post bulbar area.  As per discussion with GI, IV PPI twice daily x3 days, sucralfate suspension, await pathology  results and may need gastrin level checked when off of PPIs in the future.  Not sure if she is eating well.  Encourage p.o. intake.  Recommend SLP follow-up at SNF.  GI follow-up appreciated: Recommend twice daily PPI at discharge until follow-up with them, they will follow H. pylori results and treat as needed and arrange outpatient follow-up.  Avoid NSAIDs.  Dysphagia  SLP evaluated and placed on dysphagia 2 diet and thin liquids and suggested GI consultation.  Evaluation and management as above.  Acute kidney injury complicating chronic kidney disease stage IIIa  Secondary to dehydration with ongoing ARB and Lasix use.  ARB and Lasix held.  IV fluids changed to half-normal saline infusion after thiamine replacement, due to hypernatremia  Creatinine continues to steadily improve.  Creatinine down to 1.26 >1.19  Discontinued IV fluids.  Creatinine appears to be fluctuating.  Follow BMP in a.m. again  Dehydration with hypernatremia  Resolved after IV fluids.  However remains at risk for recurrence due to poor and inconsistent p.o. intake.  Hypokalemia/hypophosphatemia/hypomagnesemia  Replaced.  Anemia  Unclear etiology.  Some of this may be dilutional.  Hemoglobin dropped from 10.8-9.5,?  Dilutional. Hemoglobin stable in the 9 g range.  Thrombocytopenia  Unclear etiology and continues to drift down, 84 on 12/19.  Admission platelets: 231.  I discussed with Hematologist on call on 12/19: Could be related to infectious etiology early in admission, medications including antibiotics earlier on and has been on heparin DVT prophylaxis for greater than 5 days.  As per hematology recommendations, stopped heparin, switched to fondaparinux DVT prophylaxis, requested HIT antibodies and platelets in citrate (to rule out pseudothrombocytopenia from clumping).  Follow peripheral smear-reported as normocytic anemia and thrombocytopenia.  Monitor closely and if continues to worsen  then contact back for further evaluation  Platelets have improved to 125.  HIT panel pending.  Left hip pain with movement  X-ray suggest moderate osteoarthritis  Supportive/symptomatic treatment.  Type II DM  A1c 6.8, adequate control in this elderly female  Metformin currently on hold.  Continue SSI.  Essential hypertension  Controlled off of antihypertensives  Holding losartan  Chronic lymphedema  No acute issues at this time.  Sacral decubitus ulcers  Secondary to moisture associated skin damage, partial-thickness wounds  Management per WOCN RN.  This had worsened.  WOCN RN follow-up appreciated.  Incontinent of urine and soiling of the area makes it worse.  Unfortunately no easy solution.  Would like to avoid Foley catheter placement due to UTI risks.  Constipation  Dulcolax suppository and started MiraLAX and senna.  BM recorded yesterday  Body mass index is 35.87 kg/m.  Nutritional Status Nutrition Problem: Severe Malnutrition Etiology: social / environmental circumstances Signs/Symptoms: percent weight loss,mild fat depletion,moderate fat depletion,mild muscle depletion,moderate muscle depletion Percent weight loss: 6.4 % Interventions: Magic cup,MVI,Refer to RD note for recommendations    DVT prophylaxis: fondaparinux (ARIXTRA) injection 2.5 mg Start: 03/24/20 2000     Code Status: Full Code Family Communication: I discussed with patients eldest daughter via phone on 12/20, updated care and answered questions.  Advised her that patient is at high risk for recurrent decline.  She verbalized understanding. Disposition:  Status is: Inpatient  Remains inpatient appropriate because:Inpatient level  of care appropriate due to severity of illness   Dispo: The patient is from: Home              Anticipated d/c is to: SNF              Anticipated d/c date is: 03/26/2020              Patient currently is not medically stable to d/c.         Consultants:   Neu[facility]  Procedures:   None  Antimicrobials:    Anti-infectives (From admission, onward)   Start     Dose/Rate Route Frequency Ordered Stop   03/19/20 1300  fosfomycin (MONUROL) packet 3 g        3 g Oral  Once 03/19/20 1159 03/19/20 1415   03/18/20 0600  cefTRIAXone (ROCEPHIN) 1 g in sodium chloride 0.9 % 100 mL IVPB  Status:  Discontinued        1 g 200 mL/hr over 30 Minutes Intravenous Every 24 hours 03/17/20 0519 03/19/20 1159   03/17/20 0415  cefTRIAXone (ROCEPHIN) 1 g in sodium chloride 0.9 % 100 mL IVPB        1 g 200 mL/hr over 30 Minutes Intravenous  Once 03/17/20 0406 03/17/20 0448        Subjective:  As per RN, patient was alert and oriented x4 this morning.  Ate a little.  No other acute issues reported.  Slightly confused when I saw her.  Stated that she was in the [place].  "I am okay".  States that she ate some grits and drank some liquids although RN reported that she does not like [place].  Objective:   Vitals:   03/24/20 1356 03/24/20 1959 03/25/20 0309 03/25/20 1456  BP: 114/73 113/72 114/71 107/67  Pulse: (!) 101 99 97 (!) 101  Resp: 18 18 18 20   Temp: 98.3 F (36.8 C) 98.1 F (36.7 C) 98 F (36.7 C) 99.5 F (37.5 C)  TempSrc: Oral Oral Oral Oral  SpO2: 99% 99% 100% 97%  Weight:      Height:         General exam: Pleasant elderly female, moderately built and obese lying comfortably propped up in bed.  Chronically ill looking. Respiratory system: Clear to auscultation.  No increased work of breathing Cardiovascular system: S1 and S2 heard, RRR.  No JVD, murmurs or pedal edema. Gastrointestinal system: Abdomen is nondistended, soft and nontender.  Normal bowel sounds heard. Central nervous system: Alert and oriented x2. No focal neurological deficits.  Horizontal nystagmus is almost resolved. Extremities: Symmetric 5 x 5 power. Skin: Chronic lower extremity skin changes from lymphedema-no acute findings. Sacral  wounds as per picture below as examined on 12/19.  No overt infection.  Did not reevaluate these wounds today. Psychiatry: Judgement and insight improved and intact. Mood & affect pleasant and appropriate.         Data Reviewed:   I have personally reviewed following labs and imaging studies   CBC: Recent Labs  Lab 03/23/20 0402 03/24/20 0152 03/25/20 0837  WBC 6.5 6.3 8.9  HGB 9.5* 9.1* 9.2*  HCT 31.0* 29.4* 29.6*  MCV 80.7 79.2* 79.8*  PLT 95* 84* 125*    Basic Metabolic Panel: Recent Labs  Lab 03/19/20 1506 03/20/20 0338 03/21/20 0252 03/22/20 0034 03/23/20 0402 03/24/20 0152 03/25/20 0027 03/25/20 0837  NA 153*   < > 149* 146* 146* 144  --  143  K 3.3*   < >  3.2* 3.7 4.0 3.4*  --  4.4  CL 111   < > 114* 112* 110 109  --  109  CO2 25   < > 24 25 27 25   --  24  GLUCOSE 126*   < > 106* 107* 89 106*  --  100*  BUN 58*   < > 40* 29* 22 18  --  18  CREATININE 1.64*   < > 1.47* 1.35* 1.26* 1.19*  --  1.31*  CALCIUM 9.0   < > 8.8* 8.4* 8.6* 8.7*  --  8.6*  MG 1.8  --  1.6* 1.8  --   --   --   --   PHOS  --    < > 1.9* 3.4 2.6 2.8 2.8  --    < > = values in this interval not displayed.    Liver Function Tests: Recent Labs  Lab 03/19/20 0127 03/23/20 0402 03/24/20 0152  AST 34  --   --   ALT 30  --   --   ALKPHOS 46  --   --   BILITOT 0.8  --   --   PROT 5.9*  --   --   ALBUMIN 2.4* 2.1* 2.1*    CBG: Recent Labs  Lab 03/24/20 1813 03/24/20 2002 03/25/20 0835  GLUCAP 92 105* 90    Microbiology Studies:   Recent Results (from the past 240 hour(s))  Resp Panel by RT-PCR (Flu A&B, Covid) Nasopharyngeal Swab     Status: None   Collection Time: 03/16/20 11:13 PM   Specimen: Nasopharyngeal Swab; Nasopharyngeal(NP) swabs in vial transport medium  Result Value Ref Range Status   SARS Coronavirus 2 by RT PCR NEGATIVE NEGATIVE Final    Comment: (NOTE) SARS-CoV-2 target nucleic acids are NOT DETECTED.  The SARS-CoV-2 RNA is generally detectable in  upper respiratory specimens during the acute phase of infection. The lowest concentration of SARS-CoV-2 viral copies this assay can detect is 138 copies/mL. A negative result does not preclude SARS-Cov-2 infection and should not be used as the sole basis for treatment or other patient management decisions. A negative result may occur with  improper specimen collection/handling, submission of specimen other than nasopharyngeal swab, presence of viral mutation(s) within the areas targeted by this assay, and inadequate number of viral copies(<138 copies/mL). A negative result must be combined with clinical observations, patient history, and epidemiological information. The expected result is Negative.  Fact Sheet for Patients:  EntrepreneurPulse.com.au  Fact Sheet for Healthcare Providers:  IncredibleEmployment.be  This test is no t yet approved or cleared by the [place] FDA and  has been authorized for detection and/or diagnosis of SARS-CoV-2 by FDA under an Emergency Use Authorization (EUA). This EUA will remain  in effect (meaning this test can be used) for the duration of the COVID-19 declaration under Section 564(b)(1) of the Act, 21 U.S.C.section 360bbb-3(b)(1), unless the authorization is terminated  or revoked sooner.       Influenza A by PCR NEGATIVE NEGATIVE Final   Influenza B by PCR NEGATIVE NEGATIVE Final    Comment: (NOTE) The Xpert Xpress SARS-CoV-2/FLU/RSV plus assay is intended as an aid in the diagnosis of influenza from Nasopharyngeal swab specimens and should not be used as a sole basis for treatment. Nasal washings and aspirates are unacceptable for Xpert Xpress SARS-CoV-2/FLU/RSV testing.  Fact Sheet for Patients: EntrepreneurPulse.com.au  Fact Sheet for Healthcare Providers: IncredibleEmployment.be  This test is not yet approved or cleared by the [place]  FDA and has been  authorized for detection and/or diagnosis of SARS-CoV-2 by FDA under an Emergency Use Authorization (EUA). This EUA will remain in effect (meaning this test can be used) for the duration of the COVID-19 declaration under Section 564(b)(1) of the Act, 21 U.S.C. section 360bbb-3(b)(1), unless the authorization is terminated or revoked.  Performed at [facility], [place] [address] [place], [place] [address]   Blood culture (routine x 2)     Status: None   Collection Time: 03/17/20  1:04 AM   Specimen: BLOOD  Result Value Ref Range Status   Specimen Description BLOOD RIGHT ANTECUBITAL  Final   Special Requests   Final    BOTTLES DRAWN AEROBIC AND ANAEROBIC Blood Culture results may not be optimal due to an inadequate volume of blood received in culture bottles   Culture   Final    NO GROWTH 5 DAYS Performed at [facility], [place] [address] [place], [place] [address]    Report Status 03/22/2020 FINAL  Final  Blood culture (routine x 2)     Status: None   Collection Time: 03/17/20  2:07 AM   Specimen: BLOOD RIGHT HAND  Result Value Ref Range Status   Specimen Description BLOOD RIGHT HAND  Final   Special Requests   Final    BOTTLES DRAWN AEROBIC AND ANAEROBIC Blood Culture results may not be optimal due to an inadequate volume of blood received in culture bottles   Culture   Final    NO GROWTH 5 DAYS Performed at [facility], [place] [address], [place], [place] [address]    Report Status 03/22/2020 FINAL  Final  Urine culture     Status: Abnormal   Collection Time: 03/17/20 11:50 AM   Specimen: Urine, Random  Result Value Ref Range Status   Specimen Description URINE, RANDOM  Final   Special Requests   Final    NONE Performed at [facility], [place] [address], [place], [place] [address]    Culture 20,000 COLONIES/mL ENTEROCOCCUS FAECALIS (A)  Final   Report Status 03/19/2020 FINAL  Final   Organism ID, Bacteria ENTEROCOCCUS FAECALIS (A)  Final       Susceptibility   Enterococcus faecalis - MIC*    AMPICILLIN <=2 SENSITIVE Sensitive     NITROFURANTOIN <=16 SENSITIVE Sensitive     VANCOMYCIN 2 SENSITIVE Sensitive     * 20,000 COLONIES/mL ENTEROCOCCUS FAECALIS     Radiology Studies:  No results found.   Scheduled Meds:   . feeding supplement  237 mL Oral TID BM  . fondaparinux (ARIXTRA) injection  2.5 mg Subcutaneous Q24H  . free water  100 mL Oral Q4H  . insulin aspart  0-5 Units Subcutaneous QHS  . insulin aspart  0-9 Units Subcutaneous TID WC  . multivitamin with minerals  1 tablet Oral Daily  . [START ON 03/26/2020] pantoprazole  40 mg Oral BID  . pantoprazole (PROTONIX) IV  40 mg Intravenous Q12H  . polyethylene glycol  17 g Oral BID  . senna  2 tablet Oral Daily  . sucralfate  1 g Oral TID WC & HS    Continuous Infusions:   . thiamine injection 250 mg (03/25/20 1212)     LOS: 8 days     Vernell Leep, MD, [place], [facility]. [facility]    To contact the attending provider between 7A-7P or the covering provider during after hours 7P-7A, please log into the web site [email or web address] and  access using universal [place] password for that web site. If you do not have the password, please call the hospital operator.  03/25/2020, 5:20 PM

## 2020-03-25 NOTE — TOC Progression Note (Addendum)
Transition of Care [facility]) - Progression Note    Patient Details  Name: [name] [name] MRN: [id number] Date of Birth: 1945/07/06  Transition [facility] CM/SW [place], [place] Phone Number: 03/25/2020, 1:23 PM  Clinical Narrative:     CSW called pts daughter about snf bed, they are still reviewing and have choice by this afternoon.   CSW started insurance auth for SNF and Ptar. CSW will update choice.   3:30- [name] requested to speak to the MD. Renaye Rakers chose [facility] for SNF for her mother.   Expected Discharge Plan: [place] Barriers to Discharge: Continued Medical Work up  Expected Discharge Plan and Services Expected Discharge Plan: [place] In-house Referral: Clinical Social Work Discharge Planning Services: CM Consult   Living arrangements for the past 2 months: Single Family Home                                       Social Determinants of Health (SDOH) Interventions    Readmission Risk Interventions No flowsheet data found.  Emeterio Reeve, Latanya Presser, [place] Social Worker [phone]

## 2020-03-25 NOTE — Progress Notes (Signed)
          Daily Rounding Note  03/25/2020, 9:55 AM  LOS: 8 days   SUBJECTIVE:   Chief complaint: Esophagitis.  Duodenal ulcers.  IDA. Feels poorly, nothing specific.   Surgical path still pending. Stool of unclear appearance yesterday.  Chronically poor appetite.  No abdominal pain,  No nausea  OBJECTIVE:         Vital signs in last 24 hours:    Temp:  [98 F (36.7 C)-98.3 F (36.8 C)] 98 F (36.7 C) (12/20 0309) Pulse Rate:  [97-101] 97 (12/20 0309) Resp:  [18] 18 (12/20 0309) BP: (113-114)/(71-73) 114/71 (12/20 0309) SpO2:  [99 %-100 %] 100 % (12/20 0309) Last BM Date: 03/24/20 Filed Weights   03/19/20 1100  Weight: 113.4 kg   General: Morbidly obese, chronically unwell appearing.  Depressed. Heart: RRR Chest: No labored breathing or cough.  Lungs clear in front Abdomen: Obese, soft, nontender.  Active bowel sounds. Extremities: Massive bilateral lower extremity edema/venous insufficiency with venous stasis dermatitis. Neuro/Psych: Depressed, flat affect, laconic.  Answers appropriately.  Moves all 4 limbs.  No tremors.  Intake/Output from previous day: 12/19 0701 - 12/20 0700 In: 240 [P.O.:240] Out: 201 [Urine:200; Stool:1]  Intake/Output this shift: No intake/output data recorded.  Lab Results: Recent Labs    03/23/20 0402 03/24/20 0152  WBC 6.5 6.3  HGB 9.5* 9.1*  HCT 31.0* 29.4*  PLT 95* 84*   BMET Recent Labs    03/23/20 0402 03/24/20 0152  NA 146* 144  K 4.0 3.4*  CL 110 109  CO2 27 25  GLUCOSE 89 106*  BUN 22 18  CREATININE 1.26* 1.19*  CALCIUM 8.6* 8.7*   LFT Recent Labs    03/23/20 0402 03/24/20 0152  ALBUMIN 2.1* 2.1*    Studies/Results: No results found.  ASSESMENT:   *   IDA.  Several weeks of nausea, vomiting.  Home RX Protonix 40 mg daily PTA but not clear if taking.  Unremarkable Noncon CTAP 02/23/2020. 03/23/2020 EGD, grade C distal esophagitis.  Old blood in the  stomach the mucosa itself normal.  Multiple large and small, nonbleeding duodenal ulcers.  Pigmented material in the duodenal bulb and D1. No transfusion PRBC to date.   Bid IV PPI and tid carafate in place  *    AMS.  Warnicke's encephalopathy.  Thiamine in place.  *    AKI superimposed on CKD 3a  *   Thrombocytopenia.  Normal liver on CTAP of 02/2020  *    Hypokalemia.   PLAN   *    Continue Protonix 40 mg po twice daily at discharge.  Switch over from IV to p.o. tomorrow. GI will follow up on pending gastric biopsies and if  H. pylori positive she will need 14 days of additional antibiotics for eradication. Continue Carafate for total of 2 weeks.  Advance to carb mod diet.   GI fup prn.      Azucena Freed  03/25/2020, 9:55 AM Phone [phone]

## 2020-03-25 NOTE — Plan of Care (Signed)
Patient tolerating free water and apple juice slowly. Patient alert and oriented x4 this shift and encouraged to perform ROM to upper extremities. Patient able to raise rt arm to 45 degrees but lift lt arm only approx 10 inches off bed.   Problem: Education: Goal: Knowledge of General Education information will improve Description: Including pain rating scale, medication(s)/side effects and non-pharmacologic comfort measures Outcome: Progressing   Problem: Health Behavior/Discharge Planning: Goal: Ability to manage health-related needs will improve Outcome: Progressing   Problem: Clinical Measurements: Goal: Ability to maintain clinical measurements within normal limits will improve Outcome: Progressing Goal: Will remain free from infection Outcome: Progressing Goal: Diagnostic test results will improve Outcome: Progressing Goal: Respiratory complications will improve Outcome: Progressing Goal: Cardiovascular complication will be avoided Outcome: Progressing   Problem: Activity: Goal: Risk for activity intolerance will decrease Outcome: Progressing   Problem: Nutrition: Goal: Adequate nutrition will be maintained Outcome: Progressing   Problem: Coping: Goal: Level of anxiety will decrease Outcome: Progressing   Problem: Elimination: Goal: Will not experience complications related to bowel motility Outcome: Progressing Goal: Will not experience complications related to urinary retention Outcome: Progressing   Problem: Pain Managment: Goal: General experience of comfort will improve Outcome: Progressing   Problem: Safety: Goal: Ability to remain free from injury will improve Outcome: Progressing   Problem: Skin Integrity: Goal: Risk for impaired skin integrity will decrease Outcome: Progressing   Problem: Urinary Elimination: Goal: Signs and symptoms of infection will decrease Outcome: Progressing

## 2020-03-26 ENCOUNTER — Other Ambulatory Visit: Payer: Self-pay | Admitting: Physician Assistant

## 2020-03-26 ENCOUNTER — Inpatient Hospital Stay (HOSPITAL_COMMUNITY): Payer: PPO

## 2020-03-26 DIAGNOSIS — N3001 Acute cystitis with hematuria: Secondary | ICD-10-CM

## 2020-03-26 LAB — CBC
HCT: 26.7 % — ABNORMAL LOW (ref 36.0–46.0)
Hemoglobin: 8.7 g/dL — ABNORMAL LOW (ref 12.0–15.0)
MCH: 25.8 pg — ABNORMAL LOW (ref 26.0–34.0)
MCHC: 32.6 g/dL (ref 30.0–36.0)
MCV: 79.2 fL — ABNORMAL LOW (ref 80.0–100.0)
Platelets: 193 10*3/uL (ref 150–400)
RBC: 3.37 MIL/uL — ABNORMAL LOW (ref 3.87–5.11)
RDW: 19.9 % — ABNORMAL HIGH (ref 11.5–15.5)
WBC: 8.2 10*3/uL (ref 4.0–10.5)
nRBC: 0.2 % (ref 0.0–0.2)

## 2020-03-26 LAB — GLUCOSE, CAPILLARY
Glucose-Capillary: 99 mg/dL (ref 70–99)
Glucose-Capillary: 139 mg/dL — ABNORMAL HIGH (ref 70–99)
Glucose-Capillary: 112 mg/dL — ABNORMAL HIGH (ref 70–99)
Glucose-Capillary: 91 mg/dL (ref 70–99)
Glucose-Capillary: 99 mg/dL (ref 70–99)
Glucose-Capillary: 86 mg/dL (ref 70–99)

## 2020-03-26 LAB — RESP PANEL BY RT-PCR (FLU A&B, COVID) ARPGX2
Influenza A by PCR: NEGATIVE
Influenza B by PCR: NEGATIVE
SARS Coronavirus 2 by RT PCR: NEGATIVE

## 2020-03-26 LAB — RENAL FUNCTION PANEL
Albumin: 1.8 g/dL — ABNORMAL LOW (ref 3.5–5.0)
Anion gap: 10 (ref 5–15)
BUN: 22 mg/dL (ref 8–23)
CO2: 24 mmol/L (ref 22–32)
Calcium: 8.5 mg/dL — ABNORMAL LOW (ref 8.9–10.3)
Chloride: 107 mmol/L (ref 98–111)
Creatinine, Ser: 1.47 mg/dL — ABNORMAL HIGH (ref 0.44–1.00)
GFR, Estimated: 37 mL/min — ABNORMAL LOW (ref >60)
Glucose, Bld: 118 mg/dL — ABNORMAL HIGH (ref 70–99)
Phosphorus: 3.2 mg/dL (ref 2.5–4.6)
Potassium: 3.7 mmol/L (ref 3.5–5.1)
Sodium: 141 mmol/L (ref 135–145)

## 2020-03-26 LAB — D-DIMER, QUANTITATIVE: D-Dimer, Quant: 2.69 ug/mL-FEU — ABNORMAL HIGH (ref 0.00–0.50)

## 2020-03-26 LAB — SURGICAL PATHOLOGY

## 2020-03-26 LAB — MAGNESIUM: Magnesium: 1.6 mg/dL — ABNORMAL LOW (ref 1.7–2.4)

## 2020-03-26 IMAGING — DX DG CHEST 1V PORT
1 series · 1 of 1 positions shown · non-contrast
Comparison: Chest x-ray [DATE].

CLINICAL DATA: Sudden onset of fever.

EXAM:
PORTABLE CHEST 1 VIEW

[chest ap]
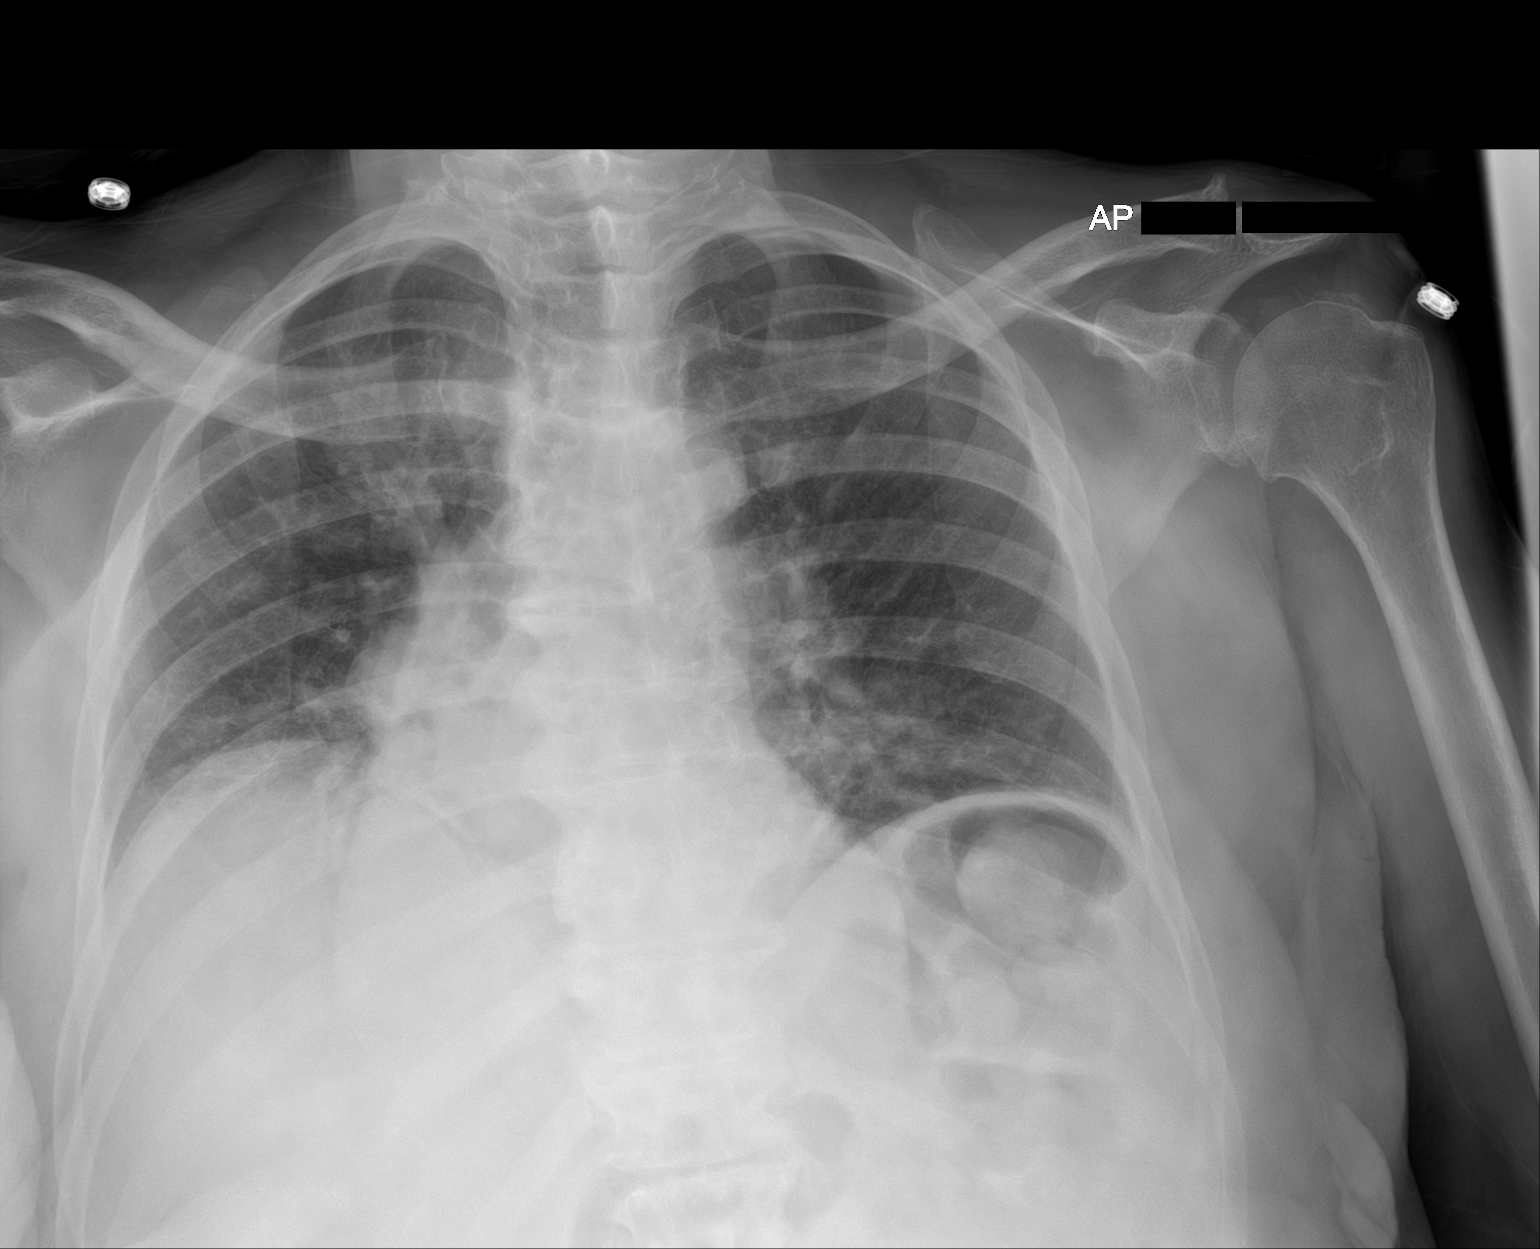

[1 of 1 positions shown; findings below may reference images not displayed]

FINDINGS: Mediastinum and hilar structures normal. Heart size normal. Low lung
volumes with bibasilar atelectasis/infiltrates. No pleural effusion
or pneumothorax. Degenerative change thoracic spine.
IMPRESSION: Low lung volumes with bibasilar atelectasis/infiltrates.

## 2020-03-26 MED ORDER — THIAMINE HCL 100 MG PO TABS: 100.0000 mg | ORAL_TABLET | Freq: Every day | ORAL | Status: DC

## 2020-03-26 MED ORDER — ADULT MULTIVITAMIN W/MINERALS CH: 1.0000 | ORAL_TABLET | Freq: Every day | ORAL | Status: DC

## 2020-03-26 MED ORDER — SODIUM CHLORIDE 0.9 % IV SOLN
1.0000 g | INTRAVENOUS | Status: DC
  Administered 2020-03-26 – 2020-03-27 (×2): 1 g via INTRAVENOUS
  Filled 2020-03-26: qty 1
  Filled 2020-03-26: qty 10

## 2020-03-26 MED ORDER — MAGNESIUM SULFATE 2 GM/50ML IV SOLN
2.0000 g | Freq: Once | INTRAVENOUS | Status: AC
  Administered 2020-03-26: 2 g via INTRAVENOUS
  Filled 2020-03-26: qty 50

## 2020-03-26 MED ORDER — PANTOPRAZOLE SODIUM 40 MG PO TBEC: 40.0000 mg | DELAYED_RELEASE_TABLET | Freq: Two times a day (BID) | ORAL | Status: DC

## 2020-03-26 MED ORDER — SUCRALFATE 1 GM/10ML PO SUSP: 1.0000 g | Freq: Three times a day (TID) | ORAL | 0 refills | Status: DC

## 2020-03-26 NOTE — Plan of Care (Signed)

## 2020-03-26 NOTE — Progress Notes (Signed)
Occupational Therapy Treatment Patient Details Name: [name] [name] MRN: [id number] DOB: 01/29/1946 Today's Date: 03/26/2020    History of present illness Pt is a 74 y.o. female admitted 03/16/20 found down by EMS after daughter requested wellness check having not heard from her for a couple days. Workup for metabolic encephalopathy, ARF, dehydration. Pt with L hip pain, xray with moderate osteoarthritis. Head CT negative for acute abnormality; age-related atrophy. MRI suggestive of Wenicke encephalopathy. PMH includes CKD3, CHF, DM2, HTN, OA, chronic lymphedema, venous stasis ulcers. 12/21 Discharge cancelled pt spiked fever and HR elevated.   OT comments  Pt anxious/restless but agreeable to therapy session with encouragement. Pt with decreased activity tolerance and strength this session compared with previous session, pt febrile and HR elevated 110-128 bpm during mobility tasks (SpO2 WNL on RA). Pt sat EOB max A +2 and required increased trunk support and frequent cues for posture/attention to task. Pt unable to bring cup to lips or sit up EOB without trunk support this date and very restless t/o. Pt +2 ma xA for rolling and +2 total A to/from EOB. Pt reports "pain all over", but especially in R knee and L shoulder during mobility. Pt to have chest xray and UA. OT will continue to follow acutely to maximize level of function and safety  Follow Up Recommendations  SNF    Equipment Recommendations  3 in 1 bedside commode;Wheelchair (measurements OT) (TBD at next venue of care)    Recommendations for Other Services      Precautions / Restrictions Precautions Precautions: Fall Restrictions Weight Bearing Restrictions: No       Mobility Bed Mobility Overal bed mobility: Needs Assistance Bed Mobility: Supine to Sit;Sit to Supine;Rolling Rolling: Max assist;+2 for physical assistance   Supine to sit: +2 for physical assistance;HOB elevated;Total assist Sit to supine: Total assist;+2  for physical assistance   General bed mobility comments: totalA for trunk elevation, pt able to assist minimally with bringing legs over EOB once rolled to L side; via log roll; return to supine via helicopter method trunk/BLE total A due to pt increased fatigue/pain  Transfers                 General transfer comment: unable to attempt this session due to severe pain and Poor sitting balance    Balance Overall balance assessment: Needs assistance Sitting-balance support: Bilateral upper extremity supported Sitting balance-Leahy Scale: Poor Sitting balance - Comments: min guard to mod A for seated balance, pt impulsive to lean forward due to pain/restlessness Postural control: Other (comment) (anterior lean)                                 ADL either performed or assessed with clinical judgement   ADL Overall ADL's : Needs assistance/impaired Eating/Feeding: Minimal assistance;Sitting Eating/Feeding Details (indicate cue type and reason): pt required assist to hold drink cup to bring to her mouth. Pt spille dcup of water seated EOB Grooming: Wash/dry hands;Wash/dry face;Sitting;Min guard           Upper Body Dressing : Sitting;Minimal assistance Upper Body Dressing Details (indicate cue type and reason): donne clean gown seated EOB                   General ADL Comments: pt distracted by pain, especially in L shoulder and R knee during bed mobility. Pt unable to sit upright without assist on EOB     Vision  Baseline Vision/History: Wears glasses Wears Glasses: Reading only Patient Visual Report: No change from baseline     Perception     Praxis      Cognition Arousal/Alertness: Awake/alert Behavior During Therapy: Restless;Anxious;Impulsive Overall Cognitive Status: Impaired/Different from baseline Area of Impairment: Problem solving;Safety/judgement;Following commands;Attention                       Following Commands: Follows  one step commands with increased time;Follows one step commands inconsistently Safety/Judgement: Decreased awareness of deficits;Decreased awareness of safety Awareness: Intellectual Problem Solving: Slow processing;Decreased initiation;Difficulty sequencing;Requires verbal cues;Requires tactile cues General Comments: pt with decreased attention to task this session, distracted by increased pain        Exercises Exercises: General Lower Extremity General Exercises - Lower Extremity Ankle Circles/Pumps: AROM;Strengthening;Both;10 reps;Seated Gluteal Sets: AROM;Strengthening;5 reps;Seated   Shoulder Instructions       General Comments febrile, HR elevated, anxous/restless; R knee feels slightly warmer to lateral side with possibly increased swelling; some increased confusion and increased difficulty maintaining grip on water cup and washcloth, difficulty lifting hands to face this date    Pertinent Vitals/ Pain       Pain Assessment: Faces Faces Pain Scale: Hurts whole lot Pain Location: R knee seems the worst, but joints of hands, shoulders also seem to be painful Pain Descriptors / Indicators: Grimacing;Moaning;Guarding Pain Intervention(s): Limited activity within patient's tolerance;Monitored during session;Premedicated before session;Repositioned  Home Living                                          Prior Functioning/Environment              Frequency  Min 2X/week        Progress Toward Goals  OT Goals(current goals can now be found in the care plan section)  Progress towards OT goals: OT to reassess next treatment  Acute Rehab OT Goals Patient Stated Goal: Agreeable to post-acute rehab services  Plan Discharge plan remains appropriate    Co-evaluation    PT/OT/SLP Co-Evaluation/Treatment: Yes Reason for Co-Treatment: Complexity of the patient's impairments (multi-system involvement);Necessary to address cognition/behavior during functional  activity;For patient/therapist safety PT goals addressed during session: Mobility/safety with mobility;Balance;Strengthening/ROM OT goals addressed during session: ADL's and self-care;Proper use of Adaptive equipment and DME      AM-PAC OT "6 Clicks" Daily Activity     Outcome Measure   Help from another person eating meals?: None Help from another person taking care of personal grooming?: A Little Help from another person toileting, which includes using toliet, bedpan, or urinal?: Total Help from another person bathing (including washing, rinsing, drying)?: A Lot Help from another person to put on and taking off regular upper body clothing?: A Lot Help from another person to put on and taking off regular lower body clothing?: Total 6 Click Score: 13    End of Session    OT Visit Diagnosis: Other abnormalities of gait and mobility (R26.89);Muscle weakness (generalized) (M62.81);History of falling (Z91.81);Pain;Other symptoms and signs involving cognitive function;Cognitive communication deficit (R41.841) Pain - Right/Left:  ("all over" per pt report) Pain - part of body:  (R knee and L shoulder)   Activity Tolerance Patient limited by fatigue;Patient limited by pain   Patient Left with call bell/phone within reach;in bed;with nursing/sitter in room   Nurse Communication          Time:  8832-5498 OT Time Calculation (min): 25 min  Charges: OT General Charges $OT Visit: 1 Visit OT Treatments $Self Care/Home Management : 8-22 mins    Britt Bottom 03/26/2020, 3:52 PM

## 2020-03-26 NOTE — Discharge Summary (Signed)
Physician Discharge Summary  Jessaca Philippi [facility]:[id number] DOB: 26-Apr-1945 DOA: 03/16/2020  PCP: Hoyt Koch, MD  Admit date: 03/16/2020 Discharge date: 03/26/2020  Admitted From: home Disposition:  SNF  Recommendations for Outpatient Follow-up:  1. Follow up with PCP in 1-2 weeks 2. Please obtain BMP/CBC in one week 3. Lasix and ARB on hold, please repeat renal function in 3 to 5 days and if stable resume 4. Ongoing speech follow-up  Home Health: none Equipment/Devices: none Discharge Condition: stable CODE STATUS: Full code Diet recommendation: heart healthy, dysphagia 2 with thin liquids  HPI: Per admitting MD,  [name] [name] is a 74 y.o. female with medical history significant for DMT2, CKD, CHF, HTN, OA who presents by EMS. Her daughter had not been able to get in touch with Ms. Butch for past two days and she called the police to do a safety check on her.  Found by police sitting on the floor, leaning forward in the central living space with bugs crawling on her. Patient was next to her walker sitting in her soiled clothes with excrement on the floor around her. Patient reportedly denied pain including chest pain, back pain, abdominal pain and SOB. Pt is now sleeping on gurney in ER and does not respond to questions or follow commands. ER provider reported that pt was initially confused when she arrived. Per chart review, patient seen on 03/08/20 by her PCP for intractable N/V x 4-6 weeks with profound weakness, lightheadedness. Found to have UTI and started on antibiotic therapy.  Pt not able to provide any history at this time. No family at bedside.   Hospital Course / Discharge diagnoses: Principal problem Acute metabolic encephalopathy -this was suspected to be multifactorial from acute kidney injury, dehydration, hypernatremia as well as concern for Warnicke's encephalopathy.  She was initially placed on IV ceftriaxone due to concern for UTI but cultures  resulted only 20 K colonies of Enterococcus.  She received 3 days of intravenous antibiotics followed by dose of fosfomycin on 12/14.  She has remained stable.  CT head, ammonia, TSH, B12, and cortisol were unremarkable.  She underwent an MRI of the brain due to slurred speech with findings highly suggestive of Wernicke's encephalopathy.  B1 level was ordered but pending at the time of discharge, she is status post high-dose IV thiamine with improvement in her mental status.  Continue thiamine on discharge.  She will be discharged to SNF for ongoing acute rehab.  Active problems Chronic nausea and vomiting, dysphagia-unclear etiology, recent CT scan of the abdomen and pelvis was unremarkable, TSH and cortisol normal.  GI was consulted and underwent an EGD on 12/18 which showed LA grade C reflux esophagitis in the distal one thirds of the esophagus, no stricture noted.  Normal stomach.  Multiple large and small nonbleeding duodenal ulcers with pigmented material in the bulb and post bulbar area.  Placed on PPI, Carafate.  Avoid NSAIDs.  Speech evaluated and recommended dysphagia 2 diet and thin liquids Acute kidney injury on chronic kidney disease stage IIIa -Baseline creatinine around 1.5, improved with fluids, fluctuating but overall appears stable.  Continue to hold ARB and Lasix and repeat renal function in 3 to 4 days Hypernatremia-due to dehydration, received IV fluids and sodium improved.  Continues to improve off IV fluids suggesting adequate p.o. intake Hypokalemia, hypomagnesemia, hypophosphatemia -continue to replete and check periodically Anemia-likely dilutional as she received IV fluids, no bleeding, overall stable.  Monitor as an outpatient Thrombocytopenia-unclear etiology, her heparin has been  stopped and HIT antibodies were sent, pending.  Platelets have now normalized. Left hip pain with movement-x-rays suggest moderate osteoarthritis, supportive treatment Type 2 diabetes mellitus-resume  home medications, A1c 6.8 Hypertension-controlled off antihypertensives.  See discussion above about ARB and Lasix Chronic lymphedema-supportive local care Sacral decubitus ulcers-secondary to moisture associated skin damage, continue wound evaluation as an outpatient  Sepsis ruled out     Discharge Instructions   Allergies as of 03/26/2020      Reactions   Oxycodone Hcl Nausea And Vomiting   Penicillins Itching   Has patient had a PCN reaction causing immediate rash, facial/tongue/throat swelling, SOB or lightheadedness with hypotension: No Has patient had a PCN reaction causing severe rash involving mucus membranes or skin necrosis: No Has patient had a PCN reaction that required hospitalization: No Has patient had a PCN reaction occurring within the last 10 years: No If all of the above answers are "NO", then may proceed with Cephalosporin use.      Medication List    STOP taking these medications   furosemide 40 MG tablet Commonly known as: LASIX   losartan 25 MG tablet Commonly known as: COZAAR     TAKE these medications   aspirin EC 81 MG tablet Take 81 mg by mouth every other day.   metFORMIN 500 MG tablet Commonly known as: GLUCOPHAGE Take 1 tablet (500 mg total) by mouth daily with breakfast.   metoCLOPramide 5 MG tablet Commonly known as: Reglan Take 1 tablet (5 mg total) by mouth 3 (three) times daily as needed for nausea.   metoprolol tartrate 50 MG tablet Commonly known as: LOPRESSOR TAKE 1 TABLET BY MOUTH TWICE A DAY   montelukast 10 MG tablet Commonly known as: SINGULAIR Take 1 tablet (10 mg total) by mouth at bedtime.   multivitamin with minerals Tabs tablet Take 1 tablet by mouth daily.   nutrition supplement (JUVEN) Pack Take 1 packet by mouth 2 (two) times daily between meals.   feeding supplement (ENSURE COMPLETE) Liqd Take 237 mLs by mouth 2 (two) times daily between meals.   ondansetron 4 MG disintegrating tablet Commonly known as:  Zofran ODT Take 1 tablet (4 mg total) by mouth every 8 (eight) hours as needed for nausea or vomiting.   pantoprazole 40 MG tablet Commonly known as: PROTONIX Take 1 tablet (40 mg total) by mouth 2 (two) times daily. What changed: when to take this   promethazine 25 MG suppository Commonly known as: Phenergan Place 1 suppository (25 mg total) rectally every 6 (six) hours as needed for nausea or vomiting.   sucralfate 1 GM/10ML suspension Commonly known as: CARAFATE Take 10 mLs (1 g total) by mouth 4 (four) times daily -  with meals and at bedtime.   thiamine 100 MG tablet Take 1 tablet (100 mg total) by mouth daily.       Contact information for after-discharge care    Destination    [facility] .   Service: Skilled Nursing Contact information: [place] [phone]                  Consultations:  Neurology   GI  Procedures/Studies:  CT Head Wo Contrast  Result Date: 03/17/2020 CLINICAL DATA:  Mental status change. EXAM: CT HEAD WITHOUT CONTRAST TECHNIQUE: Contiguous axial images were obtained from the base of the skull through the vertex without intravenous contrast. COMPARISON:  October 16, 2017. FINDINGS: Brain: No evidence of acute infarction, hemorrhage, hydrocephalus,  extra-axial collection or mass lesion/mass effect. Age related atrophy and chronic microvascular ischemic changes are noted. Vascular: No hyperdense vessel or unexpected calcification. Skull: Normal. Negative for fracture or focal lesion. Sinuses/Orbits: There is mild mucosal thickening of the bilateral maxillary sinuses. The remaining paranasal sinuses and mastoid air cells are essentially clear. Other: None. IMPRESSION: 1. No acute intracranial abnormality. 2. Age related atrophy and chronic microvascular ischemic changes are noted. Electronically Signed   By: Constance Holster M.D.   On: 03/17/2020 03:10   CT CERVICAL SPINE WO  CONTRAST  Result Date: 03/20/2020 CLINICAL DATA:  Myelopathy EXAM: CT CERVICAL SPINE WITHOUT CONTRAST TECHNIQUE: Multidetector CT imaging of the cervical spine was performed without intravenous contrast. Multiplanar CT image reconstructions were also generated. COMPARISON:  Cervical spine MRI 03/20/2020 FINDINGS: Alignment: Grade 1 retrolisthesis at C3-4 and C4-5. Skull base and vertebrae: No acute fracture. No primary bone lesion or focal pathologic process. Soft tissues and spinal canal: No prevertebral fluid or swelling. No visible canal hematoma. Disc levels: C2-3: Spondylosis without high-grade spinal canal or neural foraminal stenosis. C3-4: Large uncovertebral osteophytes with severe bilateral foraminal stenosis. Moderate spinal canal stenosis. C4-5: Uncovertebral osteophytes with severe bilateral foraminal stenosis. Moderate spinal canal stenosis. C5-6: Disc space narrowing with small uncovertebral osteophytes. No spinal canal stenosis. Mild foraminal stenosis bilaterally. C6-7: No spinal canal or neural foraminal stenosis. Upper chest: Negative. Other: None. IMPRESSION: 1. Moderate spinal canal stenosis and severe bilateral foraminal stenosis at C3-4 and C4-5. 2. No acute fracture or static subluxation of the cervical spine. Electronically Signed   By: Ulyses Jarred M.D.   On: 03/20/2020 19:09   MR BRAIN WO CONTRAST  Result Date: 03/18/2020 CLINICAL DATA:  Mental status change, unknown cause. EXAM: MRI HEAD WITHOUT CONTRAST TECHNIQUE: Multiplanar, multiecho pulse sequences of the brain and surrounding structures were obtained without intravenous contrast. COMPARISON:  Prior head CT examinations 03/17/2020 and earlier. FINDINGS: Brain: Mild cerebral and cerebellar atrophy. Diffusion-weighted and T2/FLAIR hyperintense signal abnormality within the medial thalami, periaqueductal gray matter and dorsal medulla (for instance as seen on series 5, images 79-81 and image 74) series 9, images 14 and 11)  (series 9, image 6). Mild ill-defined and scattered T2/FLAIR hyperintensity within the cerebral white matter is nonspecific, but compatible with chronic small vessel ischemic disease. No evidence of intracranial mass. No chronic intracranial blood products. No extra-axial fluid collection. No midline shift. Vascular: Expected proximal arterial flow voids. Skull and upper cervical spine: No focal marrow lesion. Cervical spondylosis. 2 mm C2-C3 grade 1 anterolisthesis. 5 mm C3-C4 grade 1 retrolisthesis which contributes to suspected at least moderate spinal canal stenosis and contacts the ventral spinal cord. A C4-C5 posterior disc osteophyte complex also contributes to suspected at least moderate spinal canal stenosis. Sinuses/Orbits: Visualized orbits show no acute finding. Moderate-sized right maxillary sinus mucous retention cyst. Trace ethmoid and maxillary sinus mucosal thickening. Impression #2 will be called to the ordering clinician or representative by the Radiologist Assistant, and communication documented in the PACS or [employer]. IMPRESSION: Diffusion-weighted and T2/FLAIR hyperintense signal abnormality within the medial thalami, periaqueductal gray matter and dorsal medulla. These findings are highly suggestive of Wernicke encephalopathy. Mild generalized parenchymal atrophy and chronic small vessel ischemic disease. Cervical spondylosis. At C3-C4, grade 1 retrolisthesis contributes to suspected at least moderate spinal canal stenosis and contacts the ventral spinal cord. At least moderate spinal canal stenosis is also suspected at C4-C5. Moderate-sized right maxillary sinus mucous retention cyst. Electronically Signed   By: Kellie Simmering DO  On: 03/18/2020 16:36   MR CERVICAL SPINE WO CONTRAST  Result Date: 03/20/2020 CLINICAL DATA:  Cervical radiculopathy rule out infection EXAM: MRI CERVICAL SPINE WITHOUT CONTRAST TECHNIQUE: Multiplanar, multisequence MR imaging of the cervical spine was  performed. No intravenous contrast was administered. COMPARISON:  None. FINDINGS: Alignment: Image quality degraded by extensive motion. The patient was not able to complete the study or hold still. 3 mm retrolisthesis C3-4 and C4-5.  Mild retrolisthesis C5-6. Vertebrae: Negative for fracture or mass. No bone marrow edema is present Cord: Cord evaluation limited by extensive motion. Posterior Fossa, vertebral arteries, paraspinal tissues: No soft tissue mass or fluid collection Disc levels: C2-3: Mild disc and facet degeneration.  Mild spinal stenosis C3-4: Disc degeneration with posterior spurring. Moderate spinal stenosis and moderate to severe foraminal encroachment bilaterally C4-5: Disc degeneration and spurring. Moderate spinal stenosis. Severe foraminal stenosis bilaterally C5-6: Disc degeneration and spurring. Mild spinal stenosis and moderate to severe foraminal stenosis bilaterally C6-7: Mild degenerative change.  Negative for stenosis C7-T1: Mild degenerative change.  Negative for stenosis. IMPRESSION: Limited study. The patient was not able to hold still or complete the study. Multilevel spondylosis causing significant spinal and foraminal stenosis at multiple levels. Recommend CT cervical spine for further evaluation. Electronically Signed   By: Franchot Gallo M.D.   On: 03/20/2020 17:00   DG Chest Port 1 View  Result Date: 03/16/2020 CLINICAL DATA:  Altered mental status, unresponsive EXAM: PORTABLE CHEST 1 VIEW COMPARISON:  Chest x-ray FINDINGS: The heart size and mediastinal contours are within normal limits. No focal consolidation. No pulmonary edema. No pleural effusion. No pneumothorax. No acute osseous abnormality. IMPRESSION: No active disease. Electronically Signed   By: Iven Finn M.D.   On: 03/16/2020 23:59   DG HIP UNILAT WITH PELVIS 2-3 VIEWS LEFT  Result Date: 03/17/2020 CLINICAL DATA:  Left hip pain. EXAM: DG HIP (WITH OR WITHOUT PELVIS) 2-3V LEFT COMPARISON:  None.  FINDINGS: There is no evidence of hip fracture or dislocation. Moderate osteophyte formation of left hip is noted. IMPRESSION: Moderate osteoarthritis of left hip. No acute abnormality seen. Electronically Signed   By: Marijo Conception M.D.   On: 03/17/2020 12:52     Subjective: - no chest pain, shortness of breath, no abdominal pain, nausea or vomiting.   Discharge Exam: BP 105/63 (BP Location: Right Arm)   Pulse (!) 105   Temp 99 F (37.2 C)   Resp 17   Ht 5\' 10"  (1.778 m)   Wt 113.4 kg   SpO2 97%   BMI 35.87 kg/m   General: Pt is alert, awake, not in acute distress Cardiovascular: RRR, S1/S2 +, no rubs, no gallops Respiratory: CTA bilaterally, no wheezing, no rhonchi Abdominal: Soft, NT, ND, bowel sounds + Extremities: no edema, no cyanosis    The results of significant diagnostics from this hospitalization (including imaging, microbiology, ancillary and laboratory) are listed below for reference.     Microbiology: Recent Results (from the past 240 hour(s))  Resp Panel by RT-PCR (Flu A&B, Covid) Nasopharyngeal Swab     Status: None   Collection Time: 03/16/20 11:13 PM   Specimen: Nasopharyngeal Swab; Nasopharyngeal(NP) swabs in vial transport medium  Result Value Ref Range Status   SARS Coronavirus 2 by RT PCR NEGATIVE NEGATIVE Final    Comment: (NOTE) SARS-CoV-2 target nucleic acids are NOT DETECTED.  The SARS-CoV-2 RNA is generally detectable in upper respiratory specimens during the acute phase of infection. The lowest concentration of SARS-CoV-2 viral copies this  assay can detect is 138 copies/mL. A negative result does not preclude SARS-Cov-2 infection and should not be used as the sole basis for treatment or other patient management decisions. A negative result may occur with  improper specimen collection/handling, submission of specimen other than nasopharyngeal swab, presence of viral mutation(s) within the areas targeted by this assay, and inadequate number  of viral copies(<138 copies/mL). A negative result must be combined with clinical observations, patient history, and epidemiological information. The expected result is Negative.  Fact Sheet for Patients:  EntrepreneurPulse.com.au  Fact Sheet for Healthcare Providers:  IncredibleEmployment.be  This test is no t yet approved or cleared by the [place] FDA and  has been authorized for detection and/or diagnosis of SARS-CoV-2 by FDA under an Emergency Use Authorization (EUA). This EUA will remain  in effect (meaning this test can be used) for the duration of the COVID-19 declaration under Section 564(b)(1) of the Act, 21 U.S.C.section 360bbb-3(b)(1), unless the authorization is terminated  or revoked sooner.       Influenza A by PCR NEGATIVE NEGATIVE Final   Influenza B by PCR NEGATIVE NEGATIVE Final    Comment: (NOTE) The Xpert Xpress SARS-CoV-2/FLU/RSV plus assay is intended as an aid in the diagnosis of influenza from Nasopharyngeal swab specimens and should not be used as a sole basis for treatment. Nasal washings and aspirates are unacceptable for Xpert Xpress SARS-CoV-2/FLU/RSV testing.  Fact Sheet for Patients: EntrepreneurPulse.com.au  Fact Sheet for Healthcare Providers: IncredibleEmployment.be  This test is not yet approved or cleared by the [place] FDA and has been authorized for detection and/or diagnosis of SARS-CoV-2 by FDA under an Emergency Use Authorization (EUA). This EUA will remain in effect (meaning this test can be used) for the duration of the COVID-19 declaration under Section 564(b)(1) of the Act, 21 U.S.C. section 360bbb-3(b)(1), unless the authorization is terminated or revoked.  Performed at [facility], [place] [address], [place], [place] [address]   Blood culture (routine x 2)     Status: None   Collection Time: 03/17/20  1:04 AM   Specimen: BLOOD  Result  Value Ref Range Status   Specimen Description BLOOD RIGHT ANTECUBITAL  Final   Special Requests   Final    BOTTLES DRAWN AEROBIC AND ANAEROBIC Blood Culture results may not be optimal due to an inadequate volume of blood received in culture bottles   Culture   Final    NO GROWTH 5 DAYS Performed at [facility], [place] [address], [place], [place] [address]    Report Status 03/22/2020 FINAL  Final  Blood culture (routine x 2)     Status: None   Collection Time: 03/17/20  2:07 AM   Specimen: BLOOD RIGHT HAND  Result Value Ref Range Status   Specimen Description BLOOD RIGHT HAND  Final   Special Requests   Final    BOTTLES DRAWN AEROBIC AND ANAEROBIC Blood Culture results may not be optimal due to an inadequate volume of blood received in culture bottles   Culture   Final    NO GROWTH 5 DAYS Performed at [facility], [place] [address], [place], [place] [address]    Report Status 03/22/2020 FINAL  Final  Urine culture     Status: Abnormal   Collection Time: 03/17/20 11:50 AM   Specimen: Urine, Random  Result Value Ref Range Status   Specimen Description URINE, RANDOM  Final   Special Requests   Final    NONE  Performed at [facility], [place] [address] [place], [place] [address]    Culture 20,000 COLONIES/mL ENTEROCOCCUS FAECALIS (A)  Final   Report Status 03/19/2020 FINAL  Final   Organism ID, Bacteria ENTEROCOCCUS FAECALIS (A)  Final      Susceptibility   Enterococcus faecalis - MIC*    AMPICILLIN <=2 SENSITIVE Sensitive     NITROFURANTOIN <=16 SENSITIVE Sensitive     VANCOMYCIN 2 SENSITIVE Sensitive     * 20,000 COLONIES/mL ENTEROCOCCUS FAECALIS     Labs: Basic Metabolic Panel: Recent Labs  Lab 03/19/20 1506 03/20/20 0338 03/21/20 0252 03/22/20 0034 03/23/20 0402 03/24/20 0152 03/25/20 0027 03/25/20 0837 03/26/20 0141  NA 153*   < > 149* 146* 146* 144  --  143 141  K 3.3*   < > 3.2* 3.7 4.0 3.4*  --  4.4 3.7  CL 111   < > 114* 112* 110 109  --  109  107  CO2 25   < > 24 25 27 25   --  24 24  GLUCOSE 126*   < > 106* 107* 89 106*  --  100* 118*  BUN 58*   < > 40* 29* 22 18  --  18 22  CREATININE 1.64*   < > 1.47* 1.35* 1.26* 1.19*  --  1.31* 1.47*  CALCIUM 9.0   < > 8.8* 8.4* 8.6* 8.7*  --  8.6* 8.5*  MG 1.8  --  1.6* 1.8  --   --   --   --  1.6*  PHOS  --    < > 1.9* 3.4 2.6 2.8 2.8  --  3.2   < > = values in this interval not displayed.   Liver Function Tests: Recent Labs  Lab 03/23/20 0402 03/24/20 0152 03/26/20 0141  ALBUMIN 2.1* 2.1* 1.8*   CBC: Recent Labs  Lab 03/22/20 0034 03/23/20 0402 03/24/20 0152 03/25/20 0837 03/26/20 0141  WBC 5.6 6.5 6.3 8.9 8.2  HGB 9.5* 9.5* 9.1* 9.2* 8.7*  HCT 30.0* 31.0* 29.4* 29.6* 26.7*  MCV 80.2 80.7 79.2* 79.8* 79.2*  PLT 110* 95* 84* 125* 193   CBG: Recent Labs  Lab 03/24/20 2002 03/25/20 0835 03/25/20 1701 03/25/20 2104 03/26/20 0747  GLUCAP 105* 90 123* 155* 112*   Hgb A1c No results for input(s): HGBA1C in the last 72 hours. Lipid Profile No results for input(s): CHOL, HDL, LDLCALC, TRIG, CHOLHDL, LDLDIRECT in the last 72 hours. Thyroid function studies No results for input(s): TSH, T4TOTAL, T3FREE, THYROIDAB in the last 72 hours.  Invalid input(s): FREET3 Urinalysis    Component Value Date/Time   COLORURINE AMBER (A) 03/17/2020 0345   APPEARANCEUR TURBID (A) 03/17/2020 0345   LABSPEC 1.023 03/17/2020 0345   PHURINE 5.0 03/17/2020 0345   GLUCOSEU NEGATIVE 03/17/2020 0345   GLUCOSEU NEGATIVE 02/02/2020 1220   HGBUR SMALL (A) 03/17/2020 0345   BILIRUBINUR MODERATE (A) 03/17/2020 0345   BILIRUBINUR Neg 10/22/2017 0950   KETONESUR NEGATIVE 03/17/2020 0345   PROTEINUR 30 (A) 03/17/2020 0345   UROBILINOGEN 0.2 02/02/2020 1220   NITRITE NEGATIVE 03/17/2020 0345   LEUKOCYTESUR LARGE (A) 03/17/2020 0345    FURTHER DISCHARGE INSTRUCTIONS:   Get Medicines reviewed and adjusted: Please take all your medications with you for your next visit with your Primary MD    Laboratory/radiological data: Please request your Primary MD to go over all hospital tests and procedure/radiological results at the follow up, please ask your Primary MD to get all Hospital records  sent to his/her office.   In some cases, they will be blood work, cultures and biopsy results pending at the time of your discharge. Please request that your primary care M.D. goes through all the records of your hospital data and follows up on these results.   Also Note the following: If you experience worsening of your admission symptoms, develop shortness of breath, life threatening emergency, suicidal or homicidal thoughts you must seek medical attention immediately by calling 911 or calling your MD immediately  if symptoms less severe.   You must read complete instructions/literature along with all the possible adverse reactions/side effects for all the Medicines you take and that have been prescribed to you. Take any new Medicines after you have completely understood and accpet all the possible adverse reactions/side effects.    Do not drive when taking Pain medications or sleeping medications (Benzodaizepines)   Do not take more than prescribed Pain, Sleep and Anxiety Medications. It is not advisable to combine anxiety,sleep and pain medications without talking with your primary care practitioner   Special Instructions: If you have smoked or chewed Tobacco  in the last 2 yrs please stop smoking, stop any regular Alcohol  and or any Recreational drug use.   Wear Seat belts while driving.   Please note: You were cared for by a hospitalist during your hospital stay. Once you are discharged, your primary care physician will handle any further medical issues. Please note that NO REFILLS for any discharge medications will be authorized once you are discharged, as it is imperative that you return to your primary care physician (or establish a relationship with a primary care physician if you do not  have one) for your post hospital discharge needs so that they can reassess your need for medications and monitor your lab values.  Time coordinating discharge: 40 minutes  SIGNED:  Marzetta Board, MD, PhD 03/26/2020, 10:35 AM

## 2020-03-26 NOTE — Progress Notes (Signed)
Physical Therapy Treatment Patient Details Name: [name] [name] MRN: [id number] DOB: 09/15/1945 Today's Date: 03/26/2020    History of Present Illness Pt is a 74 y.o. female admitted 03/16/20 found down by EMS after daughter requested wellness check having not heard from her for a couple days. Workup for metabolic encephalopathy, ARF, dehydration. Pt with L hip pain, xray with moderate osteoarthritis. Head CT negative for acute abnormality; age-related atrophy. MRI suggestive of Wenicke encephalopathy. PMH includes CKD3, CHF, DM2, HTN, OA, chronic lymphedema, venous stasis ulcers. 12/21 Discharge cancelled pt spiked fever and HR elevated.    PT Comments    Pt supine on arrival, anxious/restless but agreeable to therapy session with encouragement. Pt with decreased activity tolerance and strength this session compared with previous session, pt febrile and HR elevated 110-128 bpm during mobility tasks (SpO2 WNL on RA). Primary session focus on bed mobility and seated balance, pt needing increased trunk support and frequent cues for posture/attention to task. Pt unable to bring cup to lips or sit up EOB without trunk support this date and very restless t/o. Pt +73maxA for rolling and +2total A to/from EOB. Pt continues to benefit from PT services to progress toward functional mobility goals. DC recs below remain appropriate.  Follow Up Recommendations  SNF;Supervision/Assistance - 24 hour     Equipment Recommendations  Rolling walker with 5" wheels;3in1 (PT);Wheelchair (measurements PT);Wheelchair cushion (measurements PT);Hospital bed    Recommendations for Other Services       Precautions / Restrictions Precautions Precautions: Fall Restrictions Weight Bearing Restrictions: No    Mobility  Bed Mobility Overal bed mobility: Needs Assistance Bed Mobility: Supine to Sit;Sit to Supine;Rolling Rolling: Max assist;+2 for physical assistance   Supine to sit: +2 for physical assistance;HOB  elevated;Total assist Sit to supine: Total assist;+2 for physical assistance   General bed mobility comments: totalA for trunk elevation, pt able to assist minimally with bringing legs over EOB once rolled to L side; via log roll; return to supine via helicopter method trunk/BLE total A due to pt increased fatigue/pain  Transfers                 General transfer comment: UTA, pt reporting severe pain and poor seated balance this date/pt with fever and increased malaise, deferred for safety  Ambulation/Gait                 Stairs             Wheelchair Mobility    Modified Rankin (Stroke Patients Only)       Balance Overall balance assessment: Needs assistance Sitting-balance support: Bilateral upper extremity supported Sitting balance-Leahy Scale: Poor Sitting balance - Comments: min guard to modA for seated balance, pt impulsive to lean forward due to pain/restlessness Postural control: Other (comment) (anterior lean)                                  Cognition Arousal/Alertness: Awake/alert Behavior During Therapy: Restless;Anxious;Impulsive Overall Cognitive Status: Impaired/Different from baseline Area of Impairment: Problem solving;Safety/judgement;Following commands;Attention                       Following Commands: Follows one step commands with increased time;Follows one step commands inconsistently Safety/Judgement: Decreased awareness of deficits;Decreased awareness of safety Awareness: Intellectual Problem Solving: Slow processing;Decreased initiation;Difficulty sequencing;Requires verbal cues;Requires tactile cues General Comments: pt with decreased attention to task this session, distracted by increased pain  and      Exercises General Exercises - Lower Extremity Ankle Circles/Pumps: AROM;Strengthening;Both;10 reps;Seated Gluteal Sets: AROM;Strengthening;5 reps;Seated    General Comments General comments (skin  integrity, edema, etc.): febrile, HR elevated, anxous/restless; R knee feels slightly warmer to lateral side with possibly increased swelling; some increased confusion and increased difficulty maintaining grip on water cup and washcloth, difficulty lifting hands to face this date      Pertinent Vitals/Pain Pain Assessment: Faces Faces Pain Scale: Hurts whole lot Pain Location: R knee seems the worst, but joints of hands, shoulders also seem to be painful Pain Descriptors / Indicators: Grimacing;Moaning;Guarding Pain Intervention(s): Monitored during session;Premedicated before session;Repositioned;Limited activity within patient's tolerance  See comment section above, pt tachycardic and febrile this date, had recently received tylenol per RN.  Home Living                      Prior Function            PT Goals (current goals can now be found in the care plan section) Acute Rehab PT Goals Patient Stated Goal: Agreeable to post-acute rehab services PT Goal Formulation: With family Time For Goal Achievement: 04/02/20 Potential to Achieve Goals: Fair Progress towards PT goals: Progressing toward goals (slow progress; pain limiting)    Frequency    Min 2X/week      PT Plan Current plan remains appropriate    Co-evaluation PT/OT/SLP Co-Evaluation/Treatment: Yes Reason for Co-Treatment: Complexity of the patient's impairments (multi-system involvement);Necessary to address cognition/behavior during functional activity;For patient/therapist safety;To address functional/ADL transfers PT goals addressed during session: Mobility/safety with mobility;Balance;Strengthening/ROM        AM-PAC PT "6 Clicks" Mobility   Outcome Measure  Help needed turning from your back to your side while in a flat bed without using bedrails?: A Lot Help needed moving from lying on your back to sitting on the side of a flat bed without using bedrails?: Total Help needed moving to and from a bed  to a chair (including a wheelchair)?: Total Help needed standing up from a chair using your arms (e.g., wheelchair or bedside chair)?: Total Help needed to walk in hospital room?: Total Help needed climbing 3-5 steps with a railing? : Total 6 Click Score: 7    End of Session Equipment Utilized During Treatment: Gait belt Activity Tolerance: Patient limited by pain (Restless/painful, RN aware) Patient left: in bed;with call bell/phone within reach (air bed with 4 rails up, heels floated, xray entering room) Nurse Communication: Mobility status;Need for lift equipment PT Visit Diagnosis: Other abnormalities of gait and mobility (R26.89);Muscle weakness (generalized) (M62.81)     Time: 9678-9381 PT Time Calculation (min) (ACUTE ONLY): 25 min  Charges:  $Therapeutic Activity: 8-22 mins                     Kathya Wilz P., PTA Acute Rehabilitation Services Pager: [phone] Office: [place] 03/26/2020, 3:18 PM

## 2020-03-26 NOTE — TOC Transition Note (Signed)
Transition of Care [facility]) - CM/SW Discharge Note   Patient Details  Name: Mouna Yager MRN: [id number] Date of Birth: 1945-11-10  [facility]) CM/SW Contact:  Emeterio Reeve, [place] Phone Number: 03/26/2020, 1:25 PM   Clinical Narrative:     Pt will discharge to [facility] via ptar. Pts covid test is negative. Pts daughters have been notified of discharge.   Nurse to call report to [phone].  Final next level of care: Skilled Nursing Facility Barriers to Discharge: Barriers Resolved   Patient Goals and CMS Choice Patient states their goals for this hospitalization and ongoing recovery are:: pt unable to verbilize goals CMS Medicare.gov Compare Post Acute Care list provided to:: Patient Choice offered to / list presented to : [facility]  Discharge Placement              Patient chooses bed at: Other - please specify in the comment section below: ([facility]) Patient to be transferred to facility by: ptar Name of family member notified: Daughters Event organiser and [place] Patient and family notified of of transfer: 03/26/20  Discharge Plan and Services In-house Referral: Clinical Social Work Discharge Planning Services: [employer] Consult                                 Social Determinants of Health (SDOH) Interventions     Readmission Risk Interventions No flowsheet data found.   Emeterio Reeve, Latanya Presser, [place] Social Worker [phone]

## 2020-03-26 NOTE — Progress Notes (Signed)
   03/26/20 1345  Assess: MEWS Score  Temp (!) 100.7 F (38.2 C)  BP 126/72  Pulse Rate (!) 110  Resp 19  Level of Consciousness Alert  SpO2 97 %  O2 Device Room Air  Patient Activity (if Appropriate) In bed  Assess: MEWS Score  MEWS Temp 1  MEWS Systolic 0  MEWS Pulse 1  MEWS RR 0  MEWS LOC 0  MEWS Score 2  MEWS Score Color Yellow  Assess: if the MEWS score is Yellow or Red  Were vital signs taken at a resting state? Yes  Focused Assessment Change from prior assessment (see assessment flowsheet)  Early Detection of Sepsis Score *See Row Information* High  MEWS guidelines implemented *See Row Information* Yes  Treat  Pain Scale Faces  Faces Pain Scale 6  Pain Type Acute pain  Pain Location Leg  Pain Orientation Right;Left  Pain Descriptors / Indicators Aching  Pain Frequency Intermittent  Pain Intervention(s) Repositioned  Breathing 0  Negative Vocalization 1  Facial Expression 1  Body Language 0  Consolability 1  PAINAD Score 3  Take Vital Signs  Increase Vital Sign Frequency  Yellow: Q 2hr X 2 then Q 4hr X 2, if remains yellow, continue Q 4hrs  Escalate  MEWS: Escalate Yellow: discuss with charge nurse/RN and consider discussing with provider and RRT  Notify: Charge Nurse/RN  Name of Charge Nurse/RN Notified Derald Macleod  Date Charge Nurse/RN Notified 03/26/20  Time Charge Nurse/RN Notified 7948  Notify: Provider  Provider Name/Title Gherghe  Date Provider Notified 03/26/20  Time Provider Notified 1350  Notification Type Page  Notification Reason Change in status;Other (Comment) (preparing for discharge)  Response See new orders (MD came bedside)  Date of Provider Response 03/26/20  Time of Provider Response 1355

## 2020-03-26 NOTE — Progress Notes (Signed)
   03/26/20 1954  Assess: MEWS Score  Temp 99.5 F (37.5 C)  BP (!) 98/57  Pulse Rate (!) 101  Resp 18  Level of Consciousness Alert  SpO2 95 %  O2 Device Room Air  Patient Activity (if Appropriate) In bed  Assess: MEWS Score  MEWS Temp 0  MEWS Systolic 1  MEWS Pulse 1  MEWS RR 0  MEWS LOC 0  MEWS Score 2  MEWS Score Color Yellow

## 2020-03-26 NOTE — Progress Notes (Signed)
Notified by RN that patient spiked a temp, she was discharged and supposed to go to SNF.  She had a UTI grew Enterococcus and she has been off antibiotics for 7 days.  Patient has some pressure in her lower abdomen, will send urinalysis and start ceftriaxone.  Cancel discharge.  Obtain a chest x-ray for completeness and infectious work-up  Niv Darley M. Cruzita Lederer, MD, PhD [facility]

## 2020-03-26 NOTE — Progress Notes (Signed)
   03/26/20 1609  Assess: MEWS Score  Temp (!) 100.4 F (38 C)  BP 107/65  Pulse Rate (!) 106  Resp 20  SpO2 94 %  O2 Device Room Air  Assess: MEWS Score  MEWS Temp 0  MEWS Systolic 0  MEWS Pulse 1  MEWS RR 0  MEWS LOC 0  MEWS Score 1  MEWS Score Color Green  Assess: if the MEWS score is Yellow or Red  Were vital signs taken at a resting state? Yes

## 2020-03-26 NOTE — Progress Notes (Signed)
  Speech Language Pathology Treatment: Dysphagia  Patient Details Name: [name] [name] MRN: [id number] DOB: [birth year] Today's Date: 03/26/2020 Time: 4665-9935 SLP Time Calculation (min) (ACUTE ONLY): 15 min  Assessment / Plan / Recommendation Clinical Impression  Patient seen to address dysphagia goals. Of note, patient spiked a fever of 100.7 and was not able to discharge today to SNF.  EGD completed 12/18 revealed Reflux esophagitis, multiple large and small nonbleeding duodenal ulcers. Patient was resting and comfortable in bed and agreeable to ST session. She consumed 6-8 straw sips of thin liquids(water) without coughing or throat clearing but with facial grimace at times and appeared to have to put more effort into swallowing. Patient said, "its hard to explain" the feeling when she is swallowing. SLP informed patient of basic results of EGD (esophageal ulcers) and discussed a little of bland diet (no spicy foods, citric acid, etc) but that GI would be the ones to decide if such a diet was needed. As patient's current swallow impairment is likely primarily esophageal in nature, SLP to s/o on ST services at this time. Please reorder if needed.    HPI HPI: 74 y.o. female admitted 03/16/20 found down by EMS after daughter requested wellness check having not heard from her for a couple days. Workup for metabolic encephalopathy, ARF, dehydration. Pt with L hip pain, xray with moderate osteoarthritis. Head CT negative for acute abnormality; age-related atrophy. MRI suggestive of Wernicke's encephalopathy. PMH includes CKD3, CHF, DM2, HTN, OA, chronic lymphedema, venous stasis ulcers. A clinical swallow evaluation was completed on 12/14, with findings of functional swallow and recommendations for a regular diet and thin liquids. Per RD notes, pt shared that she has had a poor appetite for the last year; she would often have vomiting and started losing weight gradually.   RN now reports intermittent  pocketing with food. Sept 2019 pt had EGD with Dr. Bryan Lemma due to recurrent vomiting: "A mild Schatzki ring was found in the lower third of the esophagus. A TTS dilator was  passed through the scope. Dilation with an 18-19-20 mm balloon dilator was performed to 20 mm. The upper third of the esophagus and middle third of the esophagus were normal."      SLP Plan  Discharge SLP treatment due to (comment) (deficits appear secondary to esophageal dysphagia as patient with non-bleeding but large esophageal ulcers and Schatzki's ring)       Recommendations  Diet recommendations: Thin liquid;Regular Medication Administration: Whole meds with liquid Supervision: Staff to assist with self feeding Compensations: Minimize environmental distractions;Slow rate;Small sips/bites;Follow solids with liquid Postural Changes and/or Swallow Maneuvers: Seated upright 90 degrees;Upright 30-60 min after meal                Oral Care Recommendations: Oral care BID Follow up Recommendations: None SLP Visit Diagnosis: Dysphagia, pharyngoesophageal phase (R13.14) Plan: Discharge SLP treatment due to (comment) (deficits appear secondary to esophageal dysphagia as patient with non-bleeding but large esophageal ulcers and Schatzki's ring)       Joanna, MA, CCC-SLP Speech Therapy [facility] Acute Rehab

## 2020-03-27 ENCOUNTER — Inpatient Hospital Stay (HOSPITAL_COMMUNITY): Payer: PPO

## 2020-03-27 DIAGNOSIS — R509 Fever, unspecified: Secondary | ICD-10-CM

## 2020-03-27 DIAGNOSIS — F802 Mixed receptive-expressive language disorder: Secondary | ICD-10-CM

## 2020-03-27 LAB — URINALYSIS, ROUTINE W REFLEX MICROSCOPIC
Bilirubin Urine: NEGATIVE
Glucose, UA: NEGATIVE mg/dL
Ketones, ur: NEGATIVE mg/dL
Nitrite: NEGATIVE
Protein, ur: 30 mg/dL — AB
Specific Gravity, Urine: 1.015 (ref 1.005–1.030)
pH: 8 (ref 5.0–8.0)

## 2020-03-27 LAB — BASIC METABOLIC PANEL
Anion gap: 13 (ref 5–15)
BUN: 22 mg/dL (ref 8–23)
CO2: 21 mmol/L — ABNORMAL LOW (ref 22–32)
Calcium: 8.3 mg/dL — ABNORMAL LOW (ref 8.9–10.3)
Chloride: 106 mmol/L (ref 98–111)
Creatinine, Ser: 1.47 mg/dL — ABNORMAL HIGH (ref 0.44–1.00)
GFR, Estimated: 37 mL/min — ABNORMAL LOW (ref >60)
Glucose, Bld: 141 mg/dL — ABNORMAL HIGH (ref 70–99)
Potassium: 3.7 mmol/L (ref 3.5–5.1)
Sodium: 140 mmol/L (ref 135–145)

## 2020-03-27 LAB — CBC
HCT: 25.1 % — ABNORMAL LOW (ref 36.0–46.0)
Hemoglobin: 8 g/dL — ABNORMAL LOW (ref 12.0–15.0)
MCH: 24.8 pg — ABNORMAL LOW (ref 26.0–34.0)
MCHC: 31.9 g/dL (ref 30.0–36.0)
MCV: 78 fL — ABNORMAL LOW (ref 80.0–100.0)
Platelets: 436 10*3/uL — ABNORMAL HIGH (ref 150–400)
RBC: 3.22 MIL/uL — ABNORMAL LOW (ref 3.87–5.11)
RDW: 19.3 % — ABNORMAL HIGH (ref 11.5–15.5)
WBC: 7.1 10*3/uL (ref 4.0–10.5)
nRBC: 0 % (ref 0.0–0.2)

## 2020-03-27 LAB — GLUCOSE, CAPILLARY
Glucose-Capillary: 96 mg/dL (ref 70–99)
Glucose-Capillary: 82 mg/dL (ref 70–99)
Glucose-Capillary: 128 mg/dL — ABNORMAL HIGH (ref 70–99)
Glucose-Capillary: 102 mg/dL — ABNORMAL HIGH (ref 70–99)
Glucose-Capillary: 110 mg/dL — ABNORMAL HIGH (ref 70–99)

## 2020-03-27 MED ORDER — ENOXAPARIN SODIUM 30 MG/0.3ML ~~LOC~~ SOLN: 30.0000 mg | SUBCUTANEOUS | Status: DC

## 2020-03-27 MED ORDER — ENOXAPARIN SODIUM 40 MG/0.4ML ~~LOC~~ SOLN
40.0000 mg | SUBCUTANEOUS | Status: DC
  Administered 2020-03-27 – 2020-04-02 (×7): 40 mg via SUBCUTANEOUS
  Filled 2020-03-27 (×7): qty 0.4

## 2020-03-27 MED ORDER — ONDANSETRON 4 MG PO TBDP: 4.0000 mg | ORAL_TABLET | Freq: Three times a day (TID) | ORAL | Status: DC | PRN

## 2020-03-27 NOTE — Progress Notes (Signed)
PROGRESS NOTE  [name] [name] [id number] DOB: [birth year] DOA: 03/16/2020 PCP: Hoyt Koch, MD   LOS: 10 days   Brief Narrative / Interim history: 74 year old female with history of type 2 diabetes mellitus, chronic kidney disease stage IIIa, CHF, chronic lymphedema, hypertension, osteoarthritis who was brought to the ED by EMS.  Daughter reported that she had not been able to get in touch with her mom for a couple of days so she called the police to do a safety check on her and found her laying on the floor covered with stool and bugs crawling all over her.  Per chart review, it appears that she was recently seen by her PCP for intractable nausea and vomiting for 4 to 6 weeks associated with profound weakness, was also being treated for UTI, also seen in the ED on 1 such occasion, had a CT abdomen and pelvis 11/19 which was unremarkable.  Labs notable for hyponatremia, creatinine of 3.4, mildly elevated CK, lactate 2.1, abnormal urinalysis and CT head noted age-related atrophy.  She was admitted for acute metabolic encephalopathy. Mental status is gradually improved. Concerned that dysphagia is related to mechanical issues i.e. history of Schatzki's ring dilatation in the past.  S/p EGD 12/18: Reflux esophagitis, multiple large and small nonbleeding duodenal ulcers.  Subjective / 24h Interval events: Doing well this morning, no significant complaints  Assessment & Plan: Principal Problem Acute metabolic encephalopathy - this was suspected to be multifactorial from acute kidney injury, dehydration, hypernatremia as well as concern for Warnicke's encephalopathy.  She was initially placed on IV ceftriaxone due to concern for UTI but cultures resulted only 20 K colonies of Enterococcus.  She received 3 days of intravenous antibiotics followed by dose of fosfomycin on 12/14.  She has remained stable.  CT head, ammonia, TSH, B12, and cortisol were unremarkable.  She underwent an MRI of the  brain due to slurred speech with findings highly suggestive of Wernicke's encephalopathy.  B1 level was ordered but pending at the time of discharge, she is status post high-dose IV thiamine with improvement in her mental status.  Continue thiamine   Active Problems Fever -patient was supposed to be discharged to SNF on 12/21 however in the afternoon prior to discharge she developed a fever.  Source is not entirely clear, she did have a UTI with Enterococcus which has been treated with fosfomycin.  Urinalysis repeated yesterday showed rare bacteria and moderate leukocytes.  Another potential source would be her sacral ulcers although they do look quite superficial will image to rule out deeper abscesses.  Recent CT scan of the abdomen pelvis was unremarkable. Obtain MRI pelvis. D dimer slightly elevated, DVT US negative. Will obtain VQ scan  Chronic nausea and vomiting, dysphagia-unclear etiology, recent CT scan of the abdomen and pelvis was unremarkable, TSH and cortisol normal.  GI was consulted and underwent an EGD on 12/18 which showed LA grade C reflux esophagitis in the distal one thirds of the esophagus, no stricture noted. Normal stomach. Multiple large and small nonbleeding duodenal ulcers with pigmented material in the bulb and post bulbar area.  Placed on PPI, Carafate.  Avoid NSAIDs.  Speech evaluated and recommended dysphagia 2 diet and thin liquids Acute kidney injury on chronic kidney disease stage IIIa -Baseline creatinine around 1.5, improved with fluids, fluctuating but overall appears stable.  Hypernatremia-due to dehydration, received IV fluids and sodium improved.  Continues to improve off IV fluids suggesting adequate p.o. intake Hypokalemia, hypomagnesemia, hypophosphatemia -continue to replete and check  periodically Anemia-likely dilutional as she received IV fluids, no bleeding, overall stable.  Monitor as an outpatient Thrombocytopenia-unclear etiology, her heparin has been  stopped and HIT antibodies were sent and returned negative Left hip pain with movement-x-rays suggest moderate osteoarthritis, supportive treatment Type 2 diabetes mellitus-resume home medications, A1c 6.8 Hypertension-controlled off antihypertensives.  See discussion above about ARB and Lasix Chronic lymphedema-supportive local care Sacral decubitus ulcers-secondary to moisture associated skin damage. MRI as above as she is febrile  Scheduled Meds: . enoxaparin (LOVENOX) injection  40 mg Subcutaneous Q24H  . feeding supplement  237 mL Oral TID BM  . free water  100 mL Oral Q4H  . insulin aspart  0-5 Units Subcutaneous QHS  . insulin aspart  0-9 Units Subcutaneous TID WC  . multivitamin with minerals  1 tablet Oral Daily  . pantoprazole  40 mg Oral BID  . polyethylene glycol  17 g Oral BID  . senna  2 tablet Oral Daily  . sucralfate  1 g Oral TID WC & HS   Continuous Infusions: . cefTRIAXone (ROCEPHIN)  IV 1 g (03/27/20 1400)   PRN Meds:.acetaminophen **OR** acetaminophen, HYDROmorphone (DILAUDID) injection, ondansetron  Diet Orders (From admission, onward)    Start     Ordered   03/25/20 1031  Diet Carb Modified Fluid consistency: Thin; Room service appropriate? Yes  Diet effective now       Question Answer Comment  Diet-HS Snack? Nothing   Calorie Level Medium 1600-2000   Fluid consistency: Thin   Room service appropriate? Yes      03/25/20 1030          DVT prophylaxis: enoxaparin (LOVENOX) injection 40 mg Start: 03/27/20 2000     Code Status: Full Code  Family Communication: daughter at bedside   Status is: Inpatient  Remains inpatient appropriate because:Inpatient level of care appropriate due to severity of illness   Dispo: The patient is from: Home              Anticipated d/c is to: SNF              Anticipated d/c date is: 2 days              Patient currently is not medically stable to d/c.  Consultants:  GI  Procedures:  As above  Microbiology   Urine cultures - Enterococcus  Antimicrobials: Fosfomycin    Objective: Vitals:   03/26/20 1609 03/26/20 1954 03/26/20 2354 03/27/20 0448  BP: 107/65 (!) 98/57 (!) 101/57 106/60  Pulse: (!) 106 (!) 101 100 95  Resp: 20 18 17 17   Temp: (!) 100.4 F (38 C) 99.5 F (37.5 C) 99.1 F (37.3 C) 99.5 F (37.5 C)  TempSrc: Oral Oral Oral Oral  SpO2: 94% 95% 96% 98%  Weight:      Height:        Intake/Output Summary (Last 24 hours) at 03/27/2020 1457 Last data filed at 03/27/2020 [id number] Gross per 24 hour  Intake 440 ml  Output --  Net 440 ml   Filed Weights   03/19/20 1100  Weight: 113.4 kg    Examination:  Constitutional: NAD Eyes: no scleral icterus ENMT: Mucous membranes are moist.  Neck: normal, supple Respiratory: clear to auscultation bilaterally, no wheezing, no crackles.  Cardiovascular: Regular rate and rhythm, no murmurs / rubs / gallops.  Abdomen: non distended, no tenderness. Bowel sounds positive.  Musculoskeletal: no clubbing / cyanosis.  Skin: no rashes Neurologic: non focal  Data Reviewed: I have independently reviewed following labs and imaging studies   CBC: Recent Labs  Lab 03/23/20 0402 03/24/20 0152 03/25/20 0837 03/26/20 0141 03/27/20 1253  WBC 6.5 6.3 8.9 8.2 7.1  HGB 9.5* 9.1* 9.2* 8.7* 8.0*  HCT 31.0* 29.4* 29.6* 26.7* 25.1*  MCV 80.7 79.2* 79.8* 79.2* 78.0*  PLT 95* 84* 125* 193 [id number]*   Basic Metabolic Panel: Recent Labs  Lab 03/21/20 0252 03/22/20 0034 03/23/20 0402 03/24/20 0152 03/25/20 0027 03/25/20 0837 03/26/20 0141 03/27/20 1253  NA 149* 146* 146* 144  --  143 141 140  K 3.2* 3.7 4.0 3.4*  --  4.4 3.7 3.7  CL 114* 112* 110 109  --  109 107 106  CO2 24 25 27 25   --  24 24 21*  GLUCOSE 106* 107* 89 106*  --  100* 118* 141*  BUN 40* 29* 22 18  --  18 22 22   CREATININE 1.47* 1.35* 1.26* 1.19*  --  1.31* 1.47* 1.47*  CALCIUM 8.8* 8.4* 8.6* 8.7*  --  8.6* 8.5* 8.3*  MG 1.6* 1.8  --   --   --   --  1.6*  --    PHOS 1.9* 3.4 2.6 2.8 2.8  --  3.2  --    Liver Function Tests: Recent Labs  Lab 03/23/20 0402 03/24/20 0152 03/26/20 0141  ALBUMIN 2.1* 2.1* 1.8*   Coagulation Profile: No results for input(s): INR, PROTIME in the last 168 hours. HbA1C: No results for input(s): HGBA1C in the last 72 hours. CBG: Recent Labs  Lab 03/26/20 1659 03/26/20 2118 03/27/20 0617 03/27/20 0812 03/27/20 1212  GLUCAP 99 86 96 82 128*    Recent Results (from the past 240 hour(s))  Resp Panel by RT-PCR (Flu A&B, Covid) Nasopharyngeal Swab     Status: None   Collection Time: 03/26/20 11:19 AM   Specimen: Nasopharyngeal Swab; Nasopharyngeal(NP) swabs in vial transport medium  Result Value Ref Range Status   SARS Coronavirus 2 by RT PCR NEGATIVE NEGATIVE Final    Comment: (NOTE) SARS-CoV-2 target nucleic acids are NOT DETECTED.  The SARS-CoV-2 RNA is generally detectable in upper respiratory specimens during the acute phase of infection. The lowest concentration of SARS-CoV-2 viral copies this assay can detect is 138 copies/mL. A negative result does not preclude SARS-Cov-2 infection and should not be used as the sole basis for treatment or other patient management decisions. A negative result may occur with  improper specimen collection/handling, submission of specimen other than nasopharyngeal swab, presence of viral mutation(s) within the areas targeted by this assay, and inadequate number of viral copies(<138 copies/mL). A negative result must be combined with clinical observations, patient history, and epidemiological information. The expected result is Negative.  Fact Sheet for Patients:  EntrepreneurPulse.com.au  Fact Sheet for Healthcare Providers:  IncredibleEmployment.be  This test is no t yet approved or cleared by the [place] FDA and  has been authorized for detection and/or diagnosis of SARS-CoV-2 by FDA under an Emergency Use Authorization  (EUA). This EUA will remain  in effect (meaning this test can be used) for the duration of the COVID-19 declaration under Section 564(b)(1) of the Act, 21 U.S.C.section 360bbb-3(b)(1), unless the authorization is terminated  or revoked sooner.       Influenza A by PCR NEGATIVE NEGATIVE Final   Influenza B by PCR NEGATIVE NEGATIVE Final    Comment: (NOTE) The Xpert Xpress SARS-CoV-2/FLU/RSV plus assay is intended as an aid in the diagnosis of influenza  from Nasopharyngeal swab specimens and should not be used as a sole basis for treatment. Nasal washings and aspirates are unacceptable for Xpert Xpress SARS-CoV-2/FLU/RSV testing.  Fact Sheet for Patients: EntrepreneurPulse.com.au  Fact Sheet for Healthcare Providers: IncredibleEmployment.be  This test is not yet approved or cleared by the [place] FDA and has been authorized for detection and/or diagnosis of SARS-CoV-2 by FDA under an Emergency Use Authorization (EUA). This EUA will remain in effect (meaning this test can be used) for the duration of the COVID-19 declaration under Section 564(b)(1) of the Act, 21 U.S.C. section 360bbb-3(b)(1), unless the authorization is terminated or revoked.  Performed at [facility], [place] [address], [place],  [address]      Radiology Studies: VAS [place] LOWER EXTREMITY VENOUS (DVT)  Result Date: 03/27/2020  Lower Venous DVT Study Indications: FUO.  Risk Factors: None identified. Limitations: Body habitus, poor ultrasound/tissue interface and patient pain tolerance, patient positioning. Comparison Study: No prior studies. Performing Technologist: Oliver Hum RVT  Examination Guidelines: A complete evaluation includes B-mode imaging, spectral Doppler, color Doppler, and power Doppler as needed of all accessible portions of each vessel. Bilateral testing is considered an integral part of a complete examination. Limited examinations for  reoccurring indications may be performed as noted. The reflux portion of the exam is performed with the patient in reverse Trendelenburg.  +---------+---------------+---------+-----------+----------+-------------------+ RIGHT    CompressibilityPhasicitySpontaneityPropertiesThrombus Aging      +---------+---------------+---------+-----------+----------+-------------------+ CFV      Full           Yes      Yes                                      +---------+---------------+---------+-----------+----------+-------------------+ SFJ      Full                                                             +---------+---------------+---------+-----------+----------+-------------------+ FV Prox  Full                                                             +---------+---------------+---------+-----------+----------+-------------------+ FV Mid   Full                                                             +---------+---------------+---------+-----------+----------+-------------------+ FV DistalFull                                                             +---------+---------------+---------+-----------+----------+-------------------+ PFV      Full                                                             +---------+---------------+---------+-----------+----------+-------------------+  POP      Full           Yes      Yes                                      +---------+---------------+---------+-----------+----------+-------------------+ PTV      Full                                                             +---------+---------------+---------+-----------+----------+-------------------+ PERO                                                  Not well visualized +---------+---------------+---------+-----------+----------+-------------------+   +---------+---------------+---------+-----------+----------+-------------------+ LEFT      CompressibilityPhasicitySpontaneityPropertiesThrombus Aging      +---------+---------------+---------+-----------+----------+-------------------+ CFV      Full           Yes      Yes                                      +---------+---------------+---------+-----------+----------+-------------------+ SFJ      Full                                                             +---------+---------------+---------+-----------+----------+-------------------+ FV Prox  Full                                                             +---------+---------------+---------+-----------+----------+-------------------+ FV Mid   Full                                                             +---------+---------------+---------+-----------+----------+-------------------+ FV Distal               Yes      Yes                                      +---------+---------------+---------+-----------+----------+-------------------+ PFV      Full                                                             +---------+---------------+---------+-----------+----------+-------------------+ POP      Full  Yes      Yes                                      +---------+---------------+---------+-----------+----------+-------------------+ PTV      Full                                                             +---------+---------------+---------+-----------+----------+-------------------+ PERO                                                  Not well visualized +---------+---------------+---------+-----------+----------+-------------------+     Summary: RIGHT: - There is no evidence of deep vein thrombosis in the lower extremity. However, portions of this examination were limited- see technologist comments above.  - No cystic structure found in the popliteal fossa.  LEFT: - There is no evidence of deep vein thrombosis in the lower extremity. However, portions of this  examination were limited- see technologist comments above.  - No cystic structure found in the popliteal fossa.  *See table(s) above for measurements and observations.    Preliminary    Marzetta Board, MD, PhD [facility]  Between 7 am - 7 pm I am available, please contact me via Amion or Securechat  Between 7 pm - 7 am I am not available, please contact night coverage MD/APP via Amion

## 2020-03-27 NOTE — Progress Notes (Signed)
   03/27/20 1812  Assess: MEWS Score  Temp 100 F (37.8 C)  BP 95/61  Pulse Rate 98  Resp 18  Level of Consciousness Alert  SpO2 98 %  O2 Device Room Air  Assess: MEWS Score  MEWS Temp 0  MEWS Systolic 1  MEWS Pulse 0  MEWS RR 0  MEWS LOC 0  MEWS Score 1  MEWS Score Color Green

## 2020-03-27 NOTE — Progress Notes (Signed)
Changed dressing on pt sacral wounds.There is about a nickel size area of yellow slough on the right gluteal cheek near the top of the wound. The rest of the wound was bleeding in some areas. Minimal drainage that was serosanguinous and yellowish/green. Pt in more pain than yesterday post-dressing change. She said her legs just don't want to bend anymore.

## 2020-03-27 NOTE — Progress Notes (Signed)
   03/27/20 1510  Assess: MEWS Score  Temp 100.1 F (37.8 C)  BP 95/60  Pulse Rate (!) 103  Resp 19  Level of Consciousness Alert  SpO2 98 %  O2 Device Room Air  Assess: MEWS Score  MEWS Temp 0  MEWS Systolic 1  MEWS Pulse 1  MEWS RR 0  MEWS LOC 0  MEWS Score 2  MEWS Score Color Yellow  Assess: if the MEWS score is Yellow or Red  Were vital signs taken at a resting state? Yes  Focused Assessment No change from prior assessment  Early Detection of Sepsis Score *See Row Information* Medium  MEWS guidelines implemented *See Row Information* Yes  Treat  MEWS Interventions Escalated (See documentation below)  Pain Scale Faces  Faces Pain Scale 2  Pain Location Knee  Pain Orientation Right;Left  Pain Descriptors / Indicators Aching  Pain Frequency Intermittent  Pain Onset With Activity  Pain Intervention(s) Repositioned;Emotional support;Rest  Breathing 0  Take Vital Signs  Increase Vital Sign Frequency  Yellow: Q 2hr X 2 then Q 4hr X 2, if remains yellow, continue Q 4hrs  Escalate  MEWS: Escalate Yellow: discuss with charge nurse/RN and consider discussing with provider and RRT  Notify: Charge Nurse/RN  Name of Charge Nurse/RN Notified [place]  Date Charge Nurse/RN Notified 03/27/20  Time Charge Nurse/RN Notified [id number]  Notify: Provider  Provider Name/Title Marzetta Board  Date Provider Notified 03/27/20  Time Provider Notified 1512  Notification Type Face-to-face  Notification Reason Change in status  Response See new orders  Date of Provider Response 03/27/20  Time of Provider Response [id number]  Document  Patient Outcome Stabilized after interventions

## 2020-03-27 NOTE — Progress Notes (Signed)
Bilateral lower extremity venous duplex has been completed. Preliminary results can be found in CV Proc through chart review.   03/27/20 2:16 PM Carlos Levering RVT

## 2020-03-28 ENCOUNTER — Inpatient Hospital Stay (HOSPITAL_COMMUNITY): Payer: PPO

## 2020-03-28 DIAGNOSIS — N3000 Acute cystitis without hematuria: Secondary | ICD-10-CM | POA: Diagnosis not present

## 2020-03-28 LAB — COMPREHENSIVE METABOLIC PANEL
ALT: 18 U/L (ref 0–44)
AST: 26 U/L (ref 15–41)
Albumin: 1.6 g/dL — ABNORMAL LOW (ref 3.5–5.0)
Alkaline Phosphatase: 99 U/L (ref 38–126)
Anion gap: 7 (ref 5–15)
BUN: 21 mg/dL (ref 8–23)
CO2: 26 mmol/L (ref 22–32)
Calcium: 8.2 mg/dL — ABNORMAL LOW (ref 8.9–10.3)
Chloride: 109 mmol/L (ref 98–111)
Creatinine, Ser: 1.44 mg/dL — ABNORMAL HIGH (ref 0.44–1.00)
GFR, Estimated: 38 mL/min — ABNORMAL LOW (ref >60)
Glucose, Bld: 105 mg/dL — ABNORMAL HIGH (ref 70–99)
Potassium: 3.5 mmol/L (ref 3.5–5.1)
Sodium: 142 mmol/L (ref 135–145)
Total Bilirubin: 0.5 mg/dL (ref 0.3–1.2)
Total Protein: 5.2 g/dL — ABNORMAL LOW (ref 6.5–8.1)

## 2020-03-28 LAB — CBC
HCT: 23 % — ABNORMAL LOW (ref 36.0–46.0)
Hemoglobin: 7.4 g/dL — ABNORMAL LOW (ref 12.0–15.0)
MCH: 25.3 pg — ABNORMAL LOW (ref 26.0–34.0)
MCHC: 32.2 g/dL (ref 30.0–36.0)
MCV: 78.8 fL — ABNORMAL LOW (ref 80.0–100.0)
Platelets: 484 10*3/uL — ABNORMAL HIGH (ref 150–400)
RBC: 2.92 MIL/uL — ABNORMAL LOW (ref 3.87–5.11)
RDW: 19.5 % — ABNORMAL HIGH (ref 11.5–15.5)
WBC: 6.4 10*3/uL (ref 4.0–10.5)
nRBC: 0 % (ref 0.0–0.2)

## 2020-03-28 LAB — PREPARE RBC (CROSSMATCH)

## 2020-03-28 LAB — GLUCOSE, CAPILLARY
Glucose-Capillary: 97 mg/dL (ref 70–99)
Glucose-Capillary: 111 mg/dL — ABNORMAL HIGH (ref 70–99)
Glucose-Capillary: 95 mg/dL (ref 70–99)
Glucose-Capillary: 91 mg/dL (ref 70–99)

## 2020-03-28 LAB — ABO/RH: ABO/RH(D): B POS

## 2020-03-28 IMAGING — DX DG CHEST 2V
2 series · 2 of 2 positions shown · non-contrast
Comparison: [DATE]

CLINICAL DATA: Chest pain

EXAM:
CHEST - 2 VIEW

[chest lat]
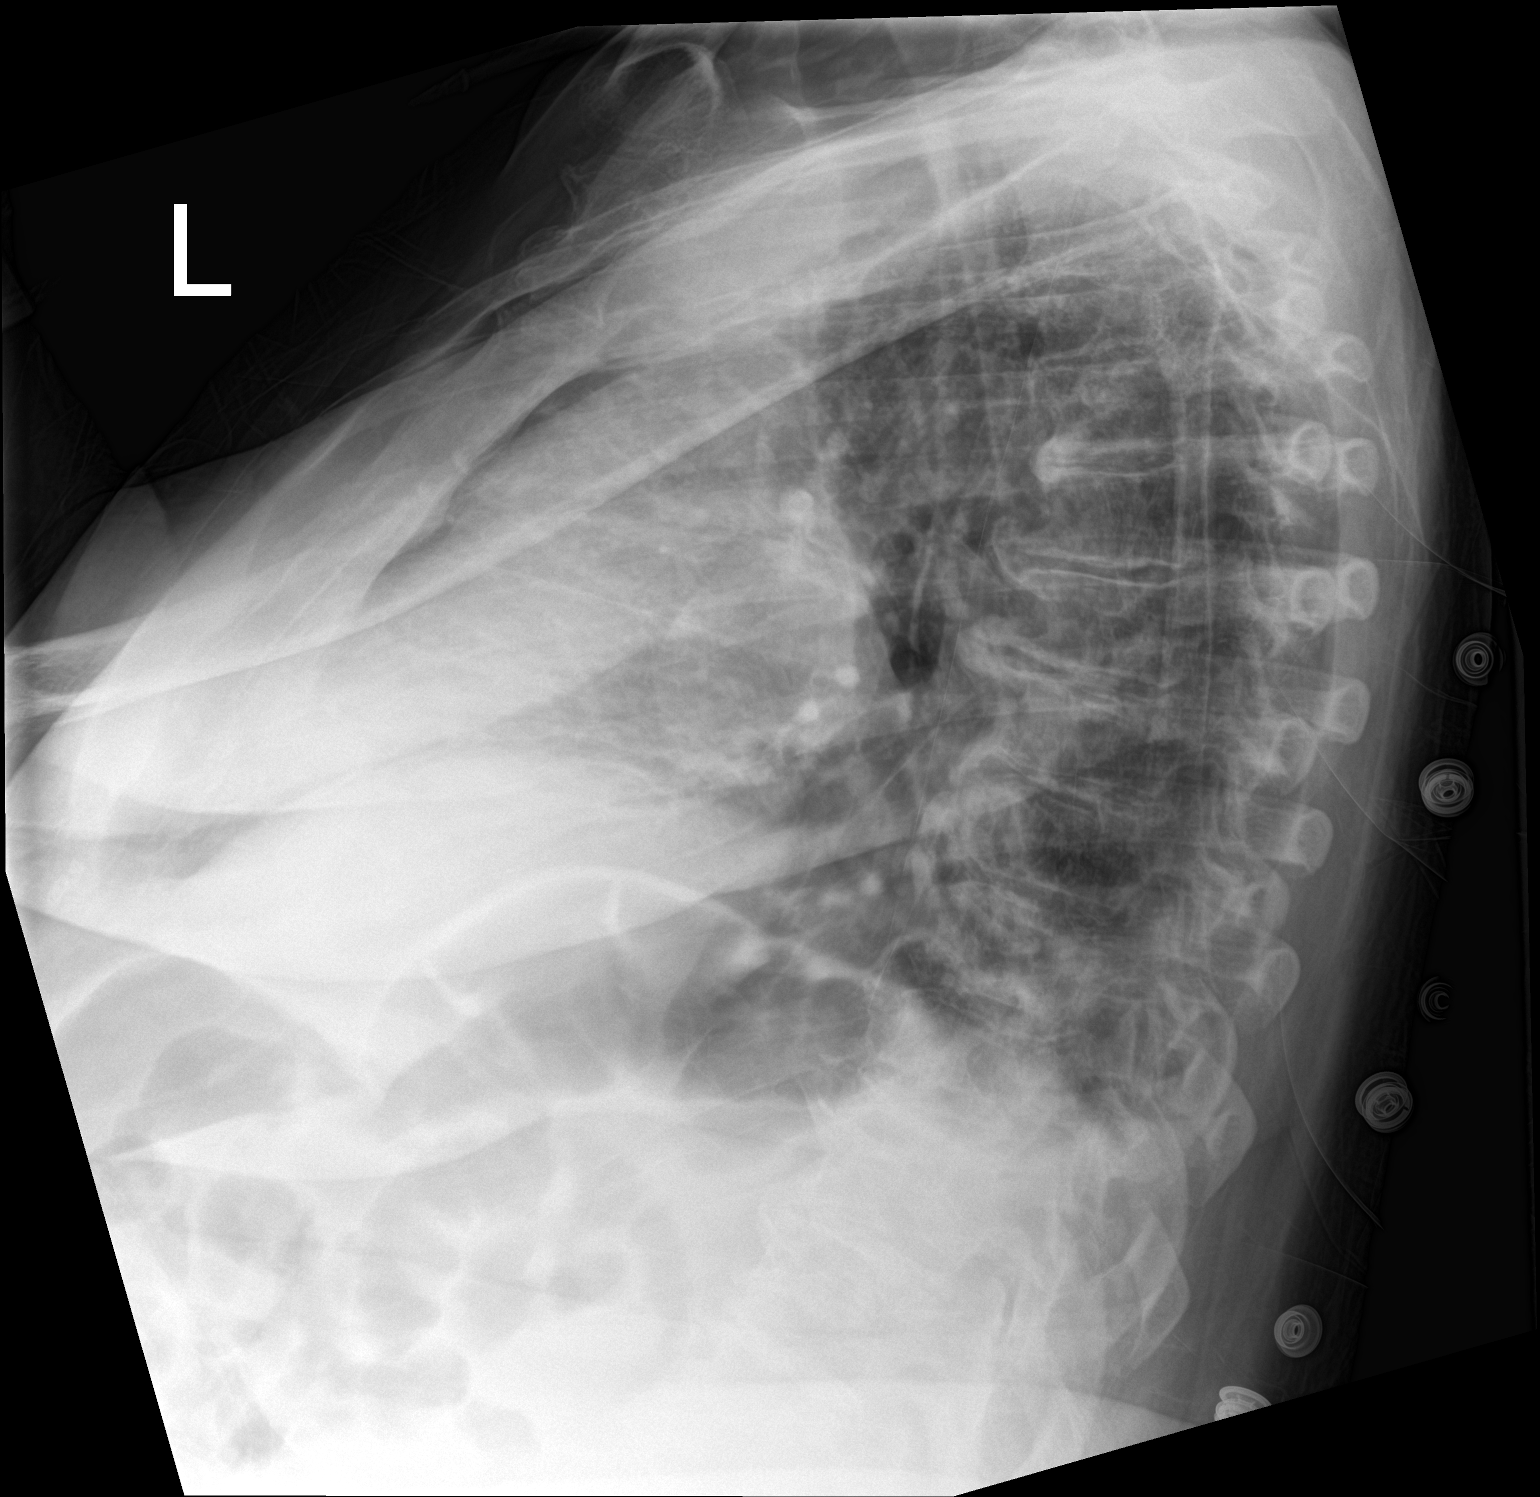

[chest ap]
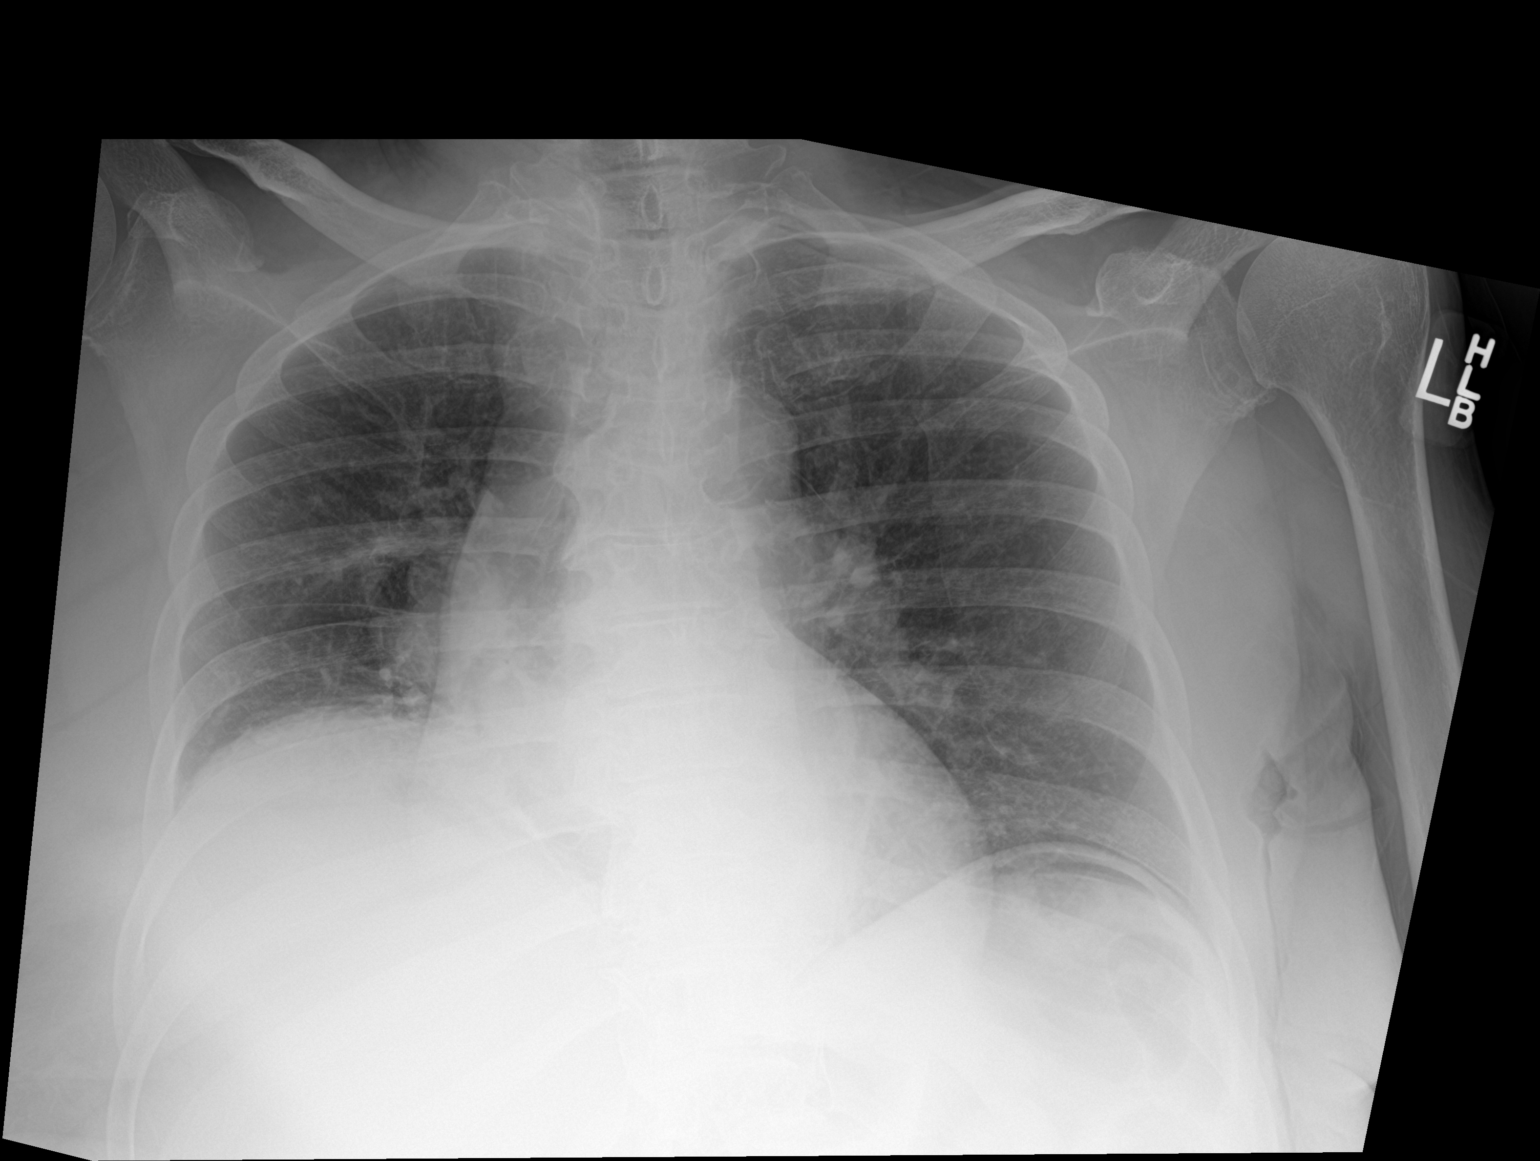

[2 of 2 positions shown; findings below may reference images not displayed]

FINDINGS: Cardiac shadow is within normal limits. The lungs are well aerated
bilaterally. No focal infiltrate or sizable effusion is seen. Mild
right basilar atelectasis is again noted. No bony abnormality is
seen.
IMPRESSION: Mild right basilar atelectasis.

## 2020-03-28 IMAGING — MR MR PELVIS W/O CM
7 of 9 series · 27 of 48 positions shown · non-contrast
Comparison: None.

CLINICAL DATA: Rectal abscess.

EXAM:
MRI PELVIS WITHOUT CONTRAST
TECHNIQUE: Multiplanar multisequence MR imaging of the pelvis was performed. No
intravenous contrast was administered.

[Series 8: T2 · coronal · 5.0mm · 1.41mm/px · 2 of 43 slices shown]
[im 1/43]
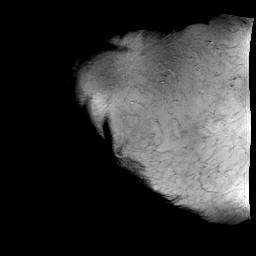
[im 43/43]
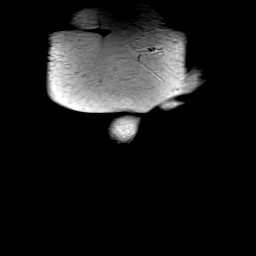

[Series 9: ax tse trig · axial · 5.0mm · 0.94mm/px · z∈[-93,+153]mm · 2 of 42 slices shown]
[im 1/42]
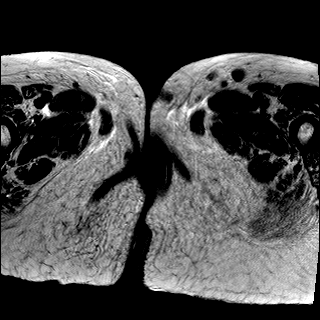
[im 42/42]
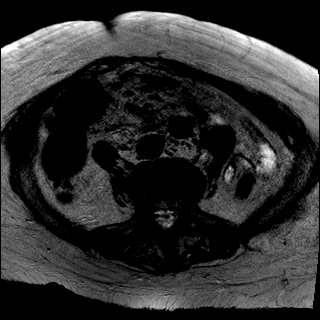

[Series 11: sag tse trig · sagittal · 5.0mm · 0.78mm/px · 3 of 47 slices shown]
[im 1/47]
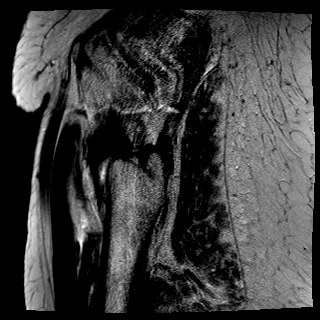
[im 24/47]
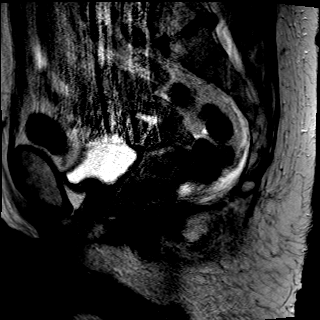
[im 47/47]
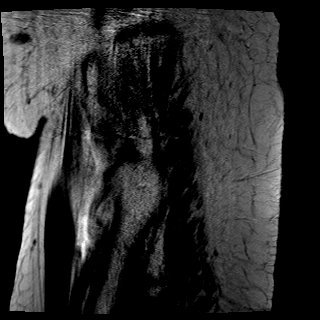

[Series 12: T2 fat-sat · axial · 5.0mm · 1.09mm/px · z∈[-90,+150]mm · 2 of 41 slices shown]
[im 1/41]
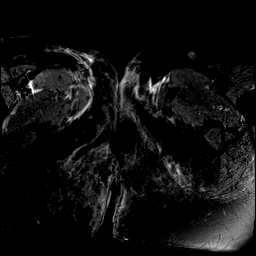
[im 41/41]
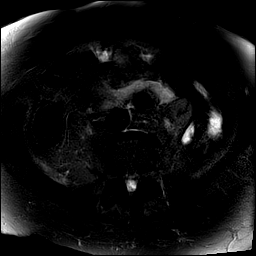

[Series 13: T1 dynamic fat-sat · axial · 1.4mm · 0.78mm/px · z∈[-92,+153]mm · 8 of 176 slices shown (1 of 2)]
[im 1/176]
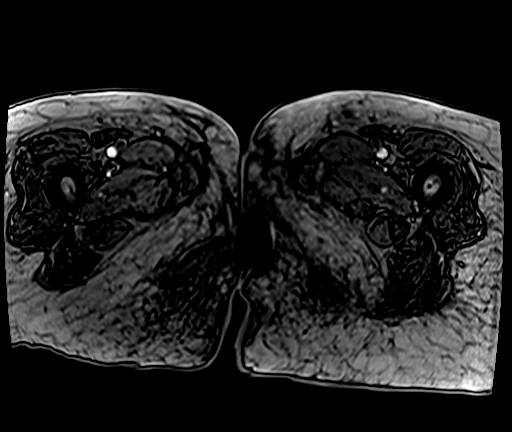
[im 20/176]
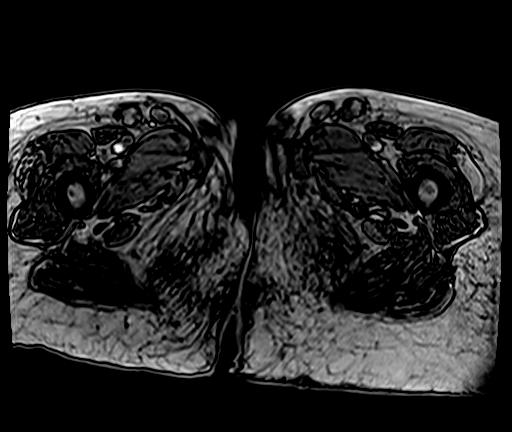
[im 59/176]
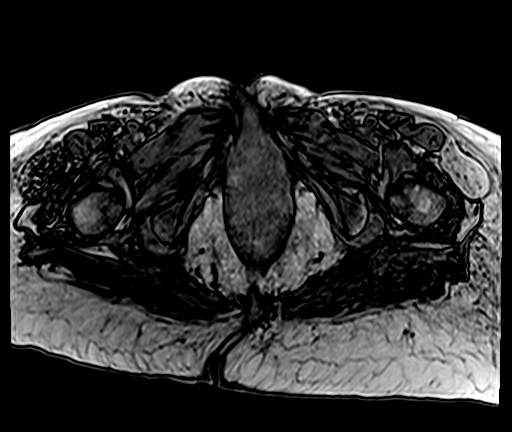
[im 78/176]
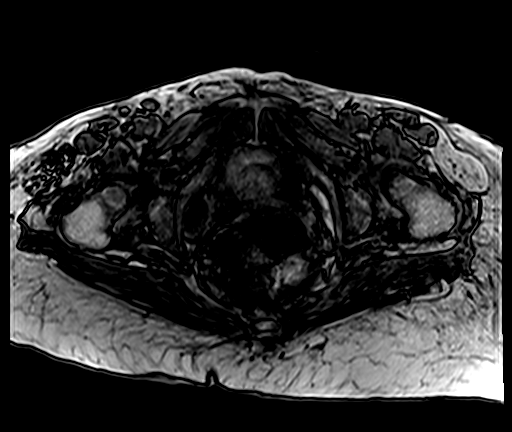
[im 98/176]
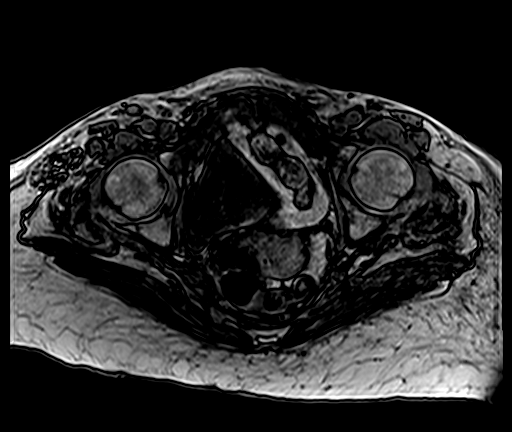
[im 117/176]
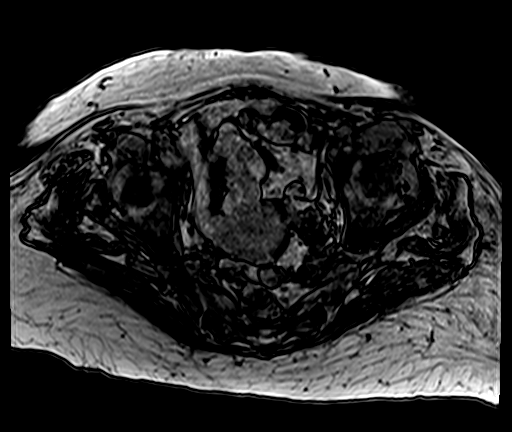
[im 156/176]
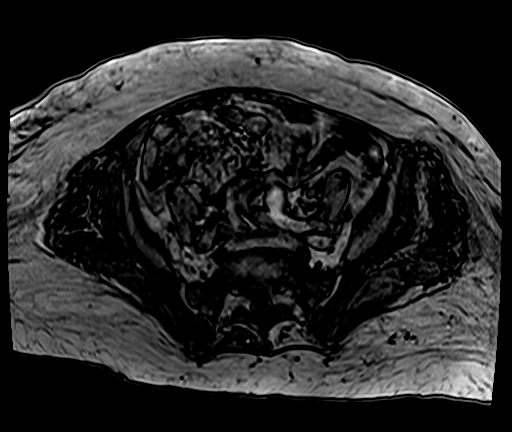
[im 176/176]
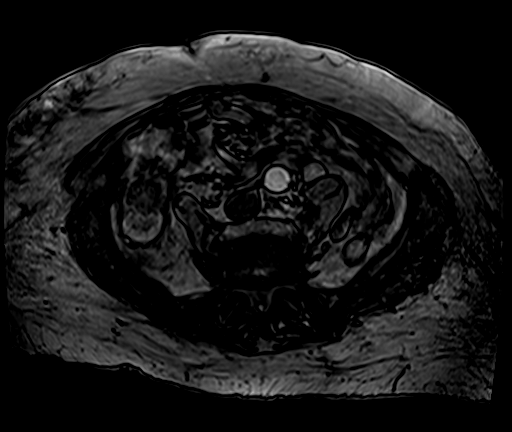

[Series 13: T1 dynamic fat-sat · axial · 1.4mm · 0.78mm/px · z∈[-92,+153]mm · 8 of 176 slices shown (2 of 2)]
[im 1/176]
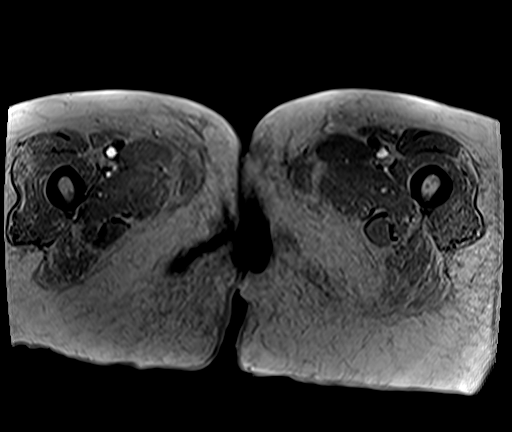
[im 20/176]
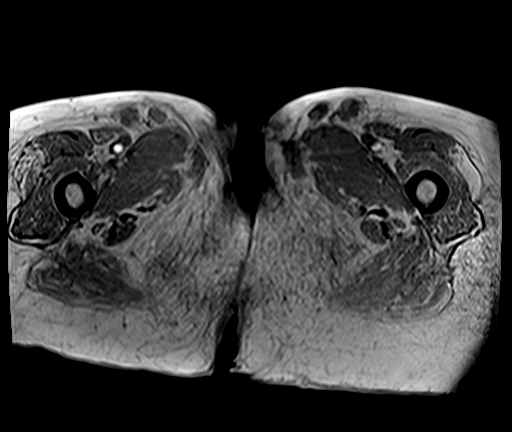
[im 59/176]
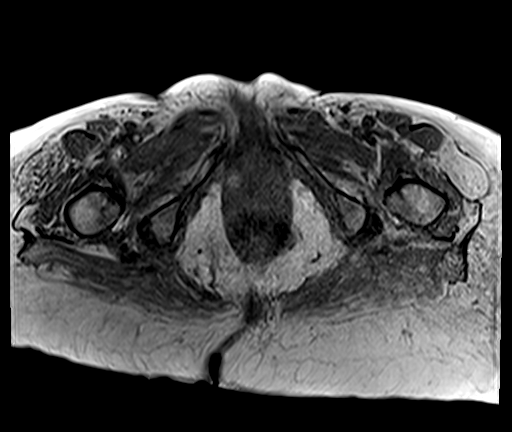
[im 78/176]
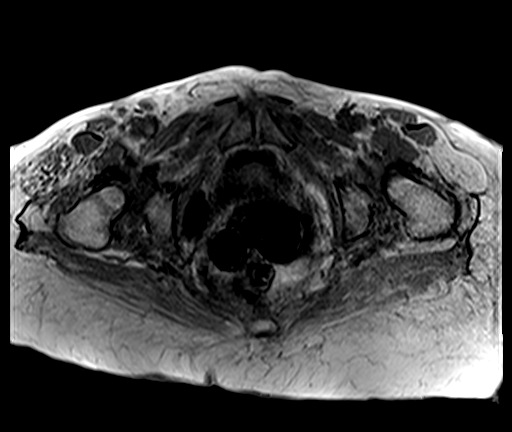
[im 98/176]
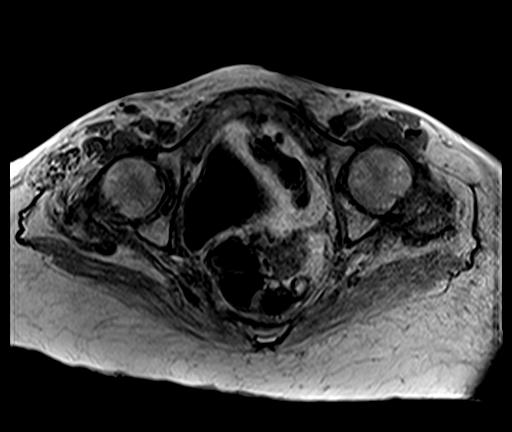
[im 117/176]
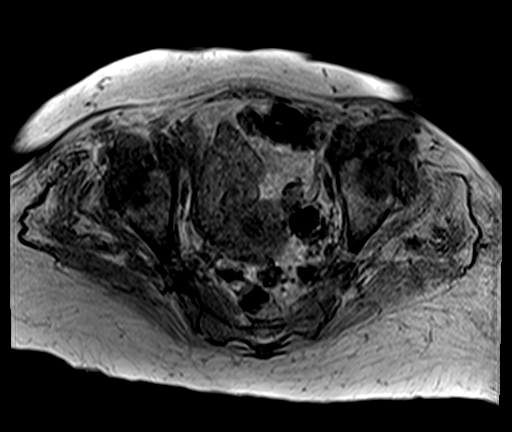
[im 156/176]
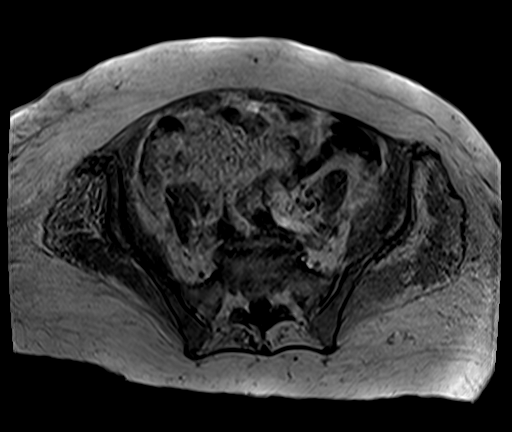
[im 176/176]
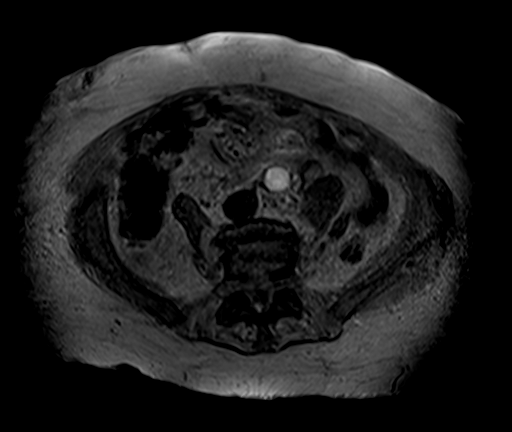

[Series 14: T1 dynamic · axial · 1.4mm · 0.78mm/px · z∈[-70,-42]mm · 2 of 144 slices shown]
[im 1/144]
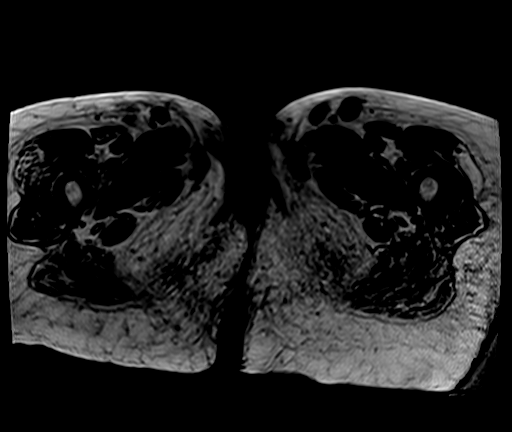
[im 21/144]
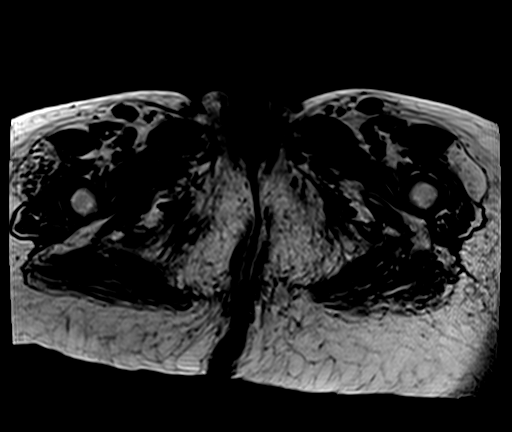

[27 of 48 positions shown; findings below may reference images not displayed]

FINDINGS: Study is markedly limited by lack of intravenous contrast material.
Areas of infection/inflammation could be occult.

Urinary Tract:  Bladder is nondistended.

Bowel: Diverticular changes are noted in the sigmoid colon. No
perirectal fistula or abscess evident on this noncontrast study.

Vascular/Lymphatic: No pathologically enlarged lymph nodes along the
pelvic sidewalls. There is mild lymphadenopathy in both groin
regions. No significant vascular abnormality seen.

Reproductive: Uterus obscured by motion artifact on sagittal
imaging. No gross endometrial thickening. No adnexal mass. 2.0 cm
cystic lesion at the posterolateral aspect of the right vaginal
introitus is in a characteristic location for Bartholin's cyst.

Other: Trace free fluid noted in the cul-de-sac. No suspicious
marrow signal abnormality.

Musculoskeletal: There is edema in the soft tissues of the pelvic
floor, along the abductor muscles bilaterally, and in the soft
tissues of the perineum.
IMPRESSION: 1. No evidence for perirectal fistula or abscess. Assessment limited
by lack of intravenous contrast material.
2. Bartholin's cyst noted at the vaginal introitus.
3. Soft tissue edema noted in the pelvic floor, adductor musculature
bilaterally, and in the soft tissues of the perineum. These changes
are associated with mild bilateral groin lymphadenopathy. Soft
tissue infection not excluded. Follow-up recommended to ensure
resolution of lymphadenopathy.

## 2020-03-28 IMAGING — NM NM PULMONARY VENT & PERF
8 series · 8 of 8 positions shown · non-contrast
Comparison: Chest radiography same day

CLINICAL DATA: Positive D-dimer. Fever. Assess for pulmonary
emboli.

EXAM:
NUCLEAR MEDICINE PERFUSION LUNG SCAN
TECHNIQUE: Perfusion images were obtained in multiple projections after
intravenous injection of radiopharmaceutical.
Ventilation scans intentionally deferred if perfusion scan and chest
x-ray adequate for interpretation during COVID 19 epidemic.
RADIOPHARMACEUTICALS:  5.12 mCi [DB] MAA IV

[Series 1: ant/post perf · 4.14mm/px · 1 of 1 slices shown (1 of 2)]
[im 1/1]
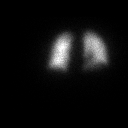

[Series 1: ant/post perf · 4.14mm/px · 1 of 1 slices shown (2 of 2)]
[im 1/1]
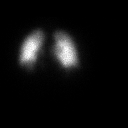

[Series 2: lao/rpo perf · 4.14mm/px · 1 of 1 slices shown (1 of 2)]
[im 1/1]
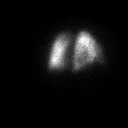

[Series 2: lao/rpo perf · 4.14mm/px · 1 of 1 slices shown (2 of 2)]
[im 1/1]
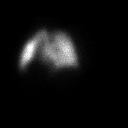

[Series 3: lpo/rao perf · 4.14mm/px · 1 of 1 slices shown (1 of 2)]
[im 1/1]
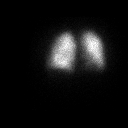

[Series 3: lpo/rao perf · 4.14mm/px · 1 of 1 slices shown (2 of 2)]
[im 1/1]
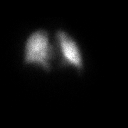

[Series 4: lt lat/rt lat perf · 4.14mm/px · 1 of 1 slices shown (1 of 2)]
[im 1/1]
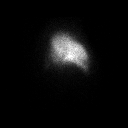

[Series 4: lt lat/rt lat perf · 4.14mm/px · 1 of 1 slices shown (2 of 2)]
[im 1/1]
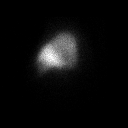

[8 of 8 positions shown; findings below may reference images not displayed]

FINDINGS: Perfusion study appears normal. No defects to suggest pulmonary
emboli.
IMPRESSION: Normal perfusion study.  No defects to suggest pulmonary emboli.

## 2020-03-28 MED ORDER — TECHNETIUM TO 99M ALBUMIN AGGREGATED
5.1200 | Freq: Once | INTRAVENOUS | Status: AC | PRN
  Administered 2020-03-28: 5.12 via INTRAVENOUS

## 2020-03-28 MED ORDER — DOXYCYCLINE HYCLATE 100 MG PO TABS
100.0000 mg | ORAL_TABLET | Freq: Two times a day (BID) | ORAL | Status: DC
  Administered 2020-03-28 – 2020-04-01 (×9): 100 mg via ORAL
  Filled 2020-03-28 (×9): qty 1

## 2020-03-28 MED ORDER — PROSOURCE PLUS PO LIQD
30.0000 mL | Freq: Three times a day (TID) | ORAL | Status: DC
  Administered 2020-03-28 – 2020-04-08 (×30): 30 mL via ORAL
  Filled 2020-03-28 (×29): qty 30

## 2020-03-28 MED ORDER — SODIUM CHLORIDE 0.9% IV SOLUTION: Freq: Once | INTRAVENOUS | Status: DC

## 2020-03-28 MED ORDER — BOOST / RESOURCE BREEZE PO LIQD CUSTOM
1.0000 | Freq: Three times a day (TID) | ORAL | Status: DC
  Administered 2020-03-28 – 2020-04-08 (×26): 1 via ORAL

## 2020-03-28 NOTE — Progress Notes (Addendum)
PROGRESS NOTE  [name] [name] [id number] DOB: June 18, 1945 DOA: 03/16/2020 PCP: Hoyt Koch, MD   LOS: 11 days   Brief Narrative / Interim history: 74 year old female with history of type 2 diabetes mellitus, chronic kidney disease stage IIIa, CHF, chronic lymphedema, hypertension, osteoarthritis who was brought to the ED by EMS.  Daughter reported that she had not been able to get in touch with her mom for a couple of days so she called the police to do a safety check on her and found her laying on the floor covered with stool and bugs crawling all over her.  Per chart review, it appears that she was recently seen by her PCP for intractable nausea and vomiting for 4 to 6 weeks associated with profound weakness, was also being treated for UTI, also seen in the ED on 1 such occasion, had a CT abdomen and pelvis 11/19 which was unremarkable.  Labs notable for hyponatremia, creatinine of 3.4, mildly elevated CK, lactate 2.1, abnormal urinalysis and CT head noted age-related atrophy.  She was admitted for acute metabolic encephalopathy. Mental status is gradually improved. Concerned that dysphagia is related to mechanical issues i.e. history of Schatzki's ring dilatation in the past.  S/p EGD 12/18: Reflux esophagitis, multiple large and small nonbleeding duodenal ulcers.  Subjective / 24h Interval events: Feels sleepy, just got back from her VQ scan   Assessment & Plan: Principal Problem Acute metabolic encephalopathy - this was suspected to be multifactorial from acute kidney injury, dehydration, hypernatremia as well as concern for Warnicke's encephalopathy.  She was initially placed on IV ceftriaxone due to concern for UTI but cultures resulted only 20 K colonies of Enterococcus.  She received 3 days of intravenous antibiotics followed by dose of fosfomycin on 12/14.  She has remained stable.  CT head, ammonia, TSH, B12, and cortisol were unremarkable.  She underwent an MRI of the brain  due to slurred speech with findings highly suggestive of Wernicke's encephalopathy.  B1 level was ordered but pending at the time of discharge, she is status post high-dose IV thiamine with improvement in her mental status.  Continue thiamine   Active Problems Fever -patient was supposed to be discharged to SNF on 12/21 however in the afternoon prior to discharge she developed a fever.  Source is not entirely clear, she did have a UTI with Enterococcus which has been treated with fosfomycin.  Urinalysis repeated showed rare bacteria and moderate leukocytes.  Another potential source would be her sacral ulcers although they do look quite superficial. MRI of the pelvis doesn't show any deep abscesses however raises the possibility of underlying cellulitis with local lymphadenopathy. Will start doxycycline and monitor fever curve -D dimer elevated, DVT US negative for DVT, VQ scan pending Chronic nausea and vomiting, dysphagia-unclear etiology, recent CT scan of the abdomen and pelvis was unremarkable, TSH and cortisol normal.  GI was consulted and underwent an EGD on 12/18 which showed LA grade C reflux esophagitis in the distal one thirds of the esophagus, no stricture noted. Normal stomach. Multiple large and small nonbleeding duodenal ulcers with pigmented material in the bulb and post bulbar area.  Placed on PPI, Carafate.  Avoid NSAIDs.  Speech evaluated and recommended dysphagia 2 diet and thin liquids> resolved Acute kidney injury on chronic kidney disease stage IIIa -Baseline creatinine around 1.5, improved with fluids, now stable off IVF Hypernatremia-due to dehydration, received IV fluids and sodium improved.  Continues to improve off IV fluids suggesting adequate p.o. intake Hypokalemia, hypomagnesemia,  hypophosphatemia -continue to replete and check periodically Anemia-likely dilutional as she received IV fluids, no bleeding other than intermittent oozing at her sacral ulcers, overall stable.  Trending down, possibly in the setting of underlying infectious process. Transfuse 1U today Thrombocytopenia-unclear etiology, her heparin has been stopped and HIT antibodies were sent and returned negative Left hip pain with movement-x-rays suggest moderate osteoarthritis, supportive treatment Type 2 diabetes mellitus-resume home medications, A1c 6.8 Hypertension-controlled off antihypertensives.  See discussion above about ARB and Lasix Chronic lymphedema-supportive local care Sacral decubitus ulcers-secondary to moisture associated skin damage. MRI as above as she is febrile  Scheduled Meds: . sodium chloride   Intravenous Once  . doxycycline  100 mg Oral Q12H  . enoxaparin (LOVENOX) injection  40 mg Subcutaneous Q24H  . feeding supplement  237 mL Oral TID BM  . free water  100 mL Oral Q4H  . insulin aspart  0-5 Units Subcutaneous QHS  . insulin aspart  0-9 Units Subcutaneous TID WC  . multivitamin with minerals  1 tablet Oral Daily  . pantoprazole  40 mg Oral BID  . polyethylene glycol  17 g Oral BID  . senna  2 tablet Oral Daily  . sucralfate  1 g Oral TID WC & HS   Continuous Infusions:  PRN Meds:.acetaminophen **OR** acetaminophen, HYDROmorphone (DILAUDID) injection, ondansetron  Diet Orders (From admission, onward)    Start     Ordered   03/25/20 1031  Diet Carb Modified Fluid consistency: Thin; Room service appropriate? Yes  Diet effective now       Question Answer Comment  Diet-HS Snack? Nothing   Calorie Level Medium 1600-2000   Fluid consistency: Thin   Room service appropriate? Yes      03/25/20 1030          DVT prophylaxis: enoxaparin (LOVENOX) injection 40 mg Start: 03/27/20 2000     Code Status: Full Code  Family Communication: daughter over the phone   Status is: Inpatient  Remains inpatient appropriate because:Inpatient level of care appropriate due to severity of illness   Dispo: The patient is from: Home              Anticipated d/c is to:  SNF              Anticipated d/c date is: 2 days              Patient currently is not medically stable to d/c.  Consultants:  GI  Procedures:  As above  Microbiology  Urine cultures - Enterococcus  Antimicrobials: Fosfomycin    Objective: Vitals:   03/27/20 1812 03/27/20 2102 03/28/20 0113 03/28/20 0529  BP: 95/61 (!) 105/55 (!) 103/58 107/77  Pulse: 98 99 99 (!) 102  Resp: 18 18 17 16   Temp: 100 F (37.8 C) 99.3 F (37.4 C) 98.9 F (37.2 C) 100.2 F (37.9 C)  TempSrc: Oral Oral  Oral  SpO2: 98% 96% 94% 95%  Weight:      Height:        Intake/Output Summary (Last 24 hours) at 03/28/2020 1144 Last data filed at 03/27/2020 2102 Gross per 24 hour  Intake 180 ml  Output 500 ml  Net -320 ml   Filed Weights   03/19/20 1100  Weight: 113.4 kg    Examination:  Constitutional: NAD Eyes: no icterus ENMT: mmm Neck: normal, supple Respiratory: CTA biL, no wheezing Cardiovascular: RRR, no MRG Abdomen: soft, nt, nd, bs+ Musculoskeletal: no clubbing / cyanosis.  Skin: no new rashes  Neurologic: non focal   Data Reviewed: I have independently reviewed following labs and imaging studies   CBC: Recent Labs  Lab 03/24/20 0152 03/25/20 0837 03/26/20 0141 03/27/20 1253 03/28/20 0103  WBC 6.3 8.9 8.2 7.1 6.4  HGB 9.1* 9.2* 8.7* 8.0* 7.4*  HCT 29.4* 29.6* 26.7* 25.1* 23.0*  MCV 79.2* 79.8* 79.2* 78.0* 78.8*  PLT 84* 125* 193 436* 123456*   Basic Metabolic Panel: Recent Labs  Lab 03/22/20 0034 03/23/20 0402 03/24/20 0152 03/25/20 0027 03/25/20 0837 03/26/20 0141 03/27/20 1253 03/28/20 0103  NA 146* 146* 144  --  143 141 140 142  K 3.7 4.0 3.4*  --  4.4 3.7 3.7 3.5  CL 112* 110 109  --  109 107 106 109  CO2 25 27 25   --  24 24 21* 26  GLUCOSE 107* 89 106*  --  100* 118* 141* 105*  BUN 29* 22 18  --  18 22 22 21   CREATININE 1.35* 1.26* 1.19*  --  1.31* 1.47* 1.47* 1.44*  CALCIUM 8.4* 8.6* 8.7*  --  8.6* 8.5* 8.3* 8.2*  MG 1.8  --   --   --   --  1.6*   --   --   PHOS 3.4 2.6 2.8 2.8  --  3.2  --   --    Liver Function Tests: Recent Labs  Lab 03/23/20 0402 03/24/20 0152 03/26/20 0141 03/28/20 0103  AST  --   --   --  26  ALT  --   --   --  18  ALKPHOS  --   --   --  99  BILITOT  --   --   --  0.5  PROT  --   --   --  5.2*  ALBUMIN 2.1* 2.1* 1.8* 1.6*   Coagulation Profile: No results for input(s): INR, PROTIME in the last 168 hours. HbA1C: No results for input(s): HGBA1C in the last 72 hours. CBG: Recent Labs  Lab 03/27/20 0812 03/27/20 1212 03/27/20 1750 03/27/20 2056 03/28/20 0740  GLUCAP 82 128* 102* 110* 97    Recent Results (from the past 240 hour(s))  Resp Panel by RT-PCR (Flu A&B, Covid) Nasopharyngeal Swab     Status: None   Collection Time: 03/26/20 11:19 AM   Specimen: Nasopharyngeal Swab; Nasopharyngeal(NP) swabs in vial transport medium  Result Value Ref Range Status   SARS Coronavirus 2 by RT PCR NEGATIVE NEGATIVE Final    Comment: (NOTE) SARS-CoV-2 target nucleic acids are NOT DETECTED.  The SARS-CoV-2 RNA is generally detectable in upper respiratory specimens during the acute phase of infection. The lowest concentration of SARS-CoV-2 viral copies this assay can detect is 138 copies/mL. A negative result does not preclude SARS-Cov-2 infection and should not be used as the sole basis for treatment or other patient management decisions. A negative result may occur with  improper specimen collection/handling, submission of specimen other than nasopharyngeal swab, presence of viral mutation(s) within the areas targeted by this assay, and inadequate number of viral copies(<138 copies/mL). A negative result must be combined with clinical observations, patient history, and epidemiological information. The expected result is Negative.  Fact Sheet for Patients:  EntrepreneurPulse.com.au  Fact Sheet for Healthcare Providers:  IncredibleEmployment.be  This test is no t  yet approved or cleared by the [place] FDA and  has been authorized for detection and/or diagnosis of SARS-CoV-2 by FDA under an Emergency Use Authorization (EUA). This EUA will remain  in effect (meaning this  test can be used) for the duration of the COVID-19 declaration under Section 564(b)(1) of the Act, 21 U.S.C.section 360bbb-3(b)(1), unless the authorization is terminated  or revoked sooner.       Influenza A by PCR NEGATIVE NEGATIVE Final   Influenza B by PCR NEGATIVE NEGATIVE Final    Comment: (NOTE) The Xpert Xpress SARS-CoV-2/FLU/RSV plus assay is intended as an aid in the diagnosis of influenza from Nasopharyngeal swab specimens and should not be used as a sole basis for treatment. Nasal washings and aspirates are unacceptable for Xpert Xpress SARS-CoV-2/FLU/RSV testing.  Fact Sheet for Patients: EntrepreneurPulse.com.au  Fact Sheet for Healthcare Providers: IncredibleEmployment.be  This test is not yet approved or cleared by the [place] FDA and has been authorized for detection and/or diagnosis of SARS-CoV-2 by FDA under an Emergency Use Authorization (EUA). This EUA will remain in effect (meaning this test can be used) for the duration of the COVID-19 declaration under Section 564(b)(1) of the Act, 21 U.S.C. section 360bbb-3(b)(1), unless the authorization is terminated or revoked.  Performed at [facility], [place] [address] [place], [place] [address]      Radiology Studies: DG Chest 2 View  Result Date: 03/28/2020 CLINICAL DATA:  Chest pain EXAM: CHEST - 2 VIEW COMPARISON:  03/26/2020 FINDINGS: Cardiac shadow is within normal limits. The lungs are well aerated bilaterally. No focal infiltrate or sizable effusion is seen. Mild right basilar atelectasis is again noted. No bony abnormality is seen. IMPRESSION: Mild right basilar atelectasis. Electronically Signed   By: Inez Catalina M.D.   On: 03/28/2020 10:28    MR PELVIS WO CONTRAST  Result Date: 03/28/2020 CLINICAL DATA:  Rectal abscess. EXAM: MRI PELVIS WITHOUT CONTRAST TECHNIQUE: Multiplanar multisequence MR imaging of the pelvis was performed. No intravenous contrast was administered. COMPARISON:  None. FINDINGS: Study is markedly limited by lack of intravenous contrast material. Areas of infection/inflammation could be occult. Urinary Tract:  Bladder is nondistended. Bowel: Diverticular changes are noted in the sigmoid colon. No perirectal fistula or abscess evident on this noncontrast study. Vascular/Lymphatic: No pathologically enlarged lymph nodes along the pelvic sidewalls. There is mild lymphadenopathy in both groin regions. No significant vascular abnormality seen. Reproductive: Uterus obscured by motion artifact on sagittal imaging. No gross endometrial thickening. No adnexal mass. 2.0 cm cystic lesion at the posterolateral aspect of the right vaginal introitus is in a characteristic location for Bartholin's cyst. Other: Trace free fluid noted in the cul-de-sac. No suspicious marrow signal abnormality. Musculoskeletal: There is edema in the soft tissues of the pelvic floor, along the abductor muscles bilaterally, and in the soft tissues of the perineum. IMPRESSION: 1. No evidence for perirectal fistula or abscess. Assessment limited by lack of intravenous contrast material. 2. Bartholin's cyst noted at the vaginal introitus. 3. Soft tissue edema noted in the pelvic floor, adductor musculature bilaterally, and in the soft tissues of the perineum. These changes are associated with mild bilateral groin lymphadenopathy. Soft tissue infection not excluded. Follow-up recommended to ensure resolution of lymphadenopathy. Electronically Signed   By: Misty Stanley M.D.   On: 03/28/2020 09:56   NM Pulmonary Perf and Vent  Result Date: 03/28/2020 CLINICAL DATA:  Positive D-dimer. Fever. Assess for pulmonary emboli. EXAM: NUCLEAR MEDICINE PERFUSION LUNG SCAN  TECHNIQUE: Perfusion images were obtained in multiple projections after intravenous injection of radiopharmaceutical. Ventilation scans intentionally deferred if perfusion scan and chest x-ray adequate for interpretation during COVID 19 epidemic. RADIOPHARMACEUTICALS:  5.12 mCi Tc-71m MAA IV COMPARISON:  Chest  radiography same day FINDINGS: Perfusion study appears normal. No defects to suggest pulmonary emboli. IMPRESSION: Normal perfusion study.  No defects to suggest pulmonary emboli. Electronically Signed   By: Nelson Chimes M.D.   On: 03/28/2020 11:32   VAS [place] LOWER EXTREMITY VENOUS (DVT)  Result Date: 03/27/2020  Lower Venous DVT Study Indications: FUO.  Risk Factors: None identified. Limitations: Body habitus, poor ultrasound/tissue interface and patient pain tolerance, patient positioning. Comparison Study: No prior studies. Performing Technologist: Oliver Hum RVT  Examination Guidelines: A complete evaluation includes B-mode imaging, spectral Doppler, color Doppler, and power Doppler as needed of all accessible portions of each vessel. Bilateral testing is considered an integral part of a complete examination. Limited examinations for reoccurring indications may be performed as noted. The reflux portion of the exam is performed with the patient in reverse Trendelenburg.  +---------+---------------+---------+-----------+----------+-------------------+ RIGHT    CompressibilityPhasicitySpontaneityPropertiesThrombus Aging      +---------+---------------+---------+-----------+----------+-------------------+ CFV      Full           Yes      Yes                                      +---------+---------------+---------+-----------+----------+-------------------+ SFJ      Full                                                             +---------+---------------+---------+-----------+----------+-------------------+ FV Prox  Full                                                              +---------+---------------+---------+-----------+----------+-------------------+ FV Mid   Full                                                             +---------+---------------+---------+-----------+----------+-------------------+ FV DistalFull                                                             +---------+---------------+---------+-----------+----------+-------------------+ PFV      Full                                                             +---------+---------------+---------+-----------+----------+-------------------+ POP      Full           Yes      Yes                                      +---------+---------------+---------+-----------+----------+-------------------+  PTV      Full                                                             +---------+---------------+---------+-----------+----------+-------------------+ PERO                                                  Not well visualized +---------+---------------+---------+-----------+----------+-------------------+   +---------+---------------+---------+-----------+----------+-------------------+ LEFT     CompressibilityPhasicitySpontaneityPropertiesThrombus Aging      +---------+---------------+---------+-----------+----------+-------------------+ CFV      Full           Yes      Yes                                      +---------+---------------+---------+-----------+----------+-------------------+ SFJ      Full                                                             +---------+---------------+---------+-----------+----------+-------------------+ FV Prox  Full                                                             +---------+---------------+---------+-----------+----------+-------------------+ FV Mid   Full                                                              +---------+---------------+---------+-----------+----------+-------------------+ FV Distal               Yes      Yes                                      +---------+---------------+---------+-----------+----------+-------------------+ PFV      Full                                                             +---------+---------------+---------+-----------+----------+-------------------+ POP      Full           Yes      Yes                                      +---------+---------------+---------+-----------+----------+-------------------+ PTV      Full                                                             +---------+---------------+---------+-----------+----------+-------------------+  PERO                                                  Not well visualized +---------+---------------+---------+-----------+----------+-------------------+     Summary: RIGHT: - There is no evidence of deep vein thrombosis in the lower extremity. However, portions of this examination were limited- see technologist comments above.  - No cystic structure found in the popliteal fossa.  LEFT: - There is no evidence of deep vein thrombosis in the lower extremity. However, portions of this examination were limited- see technologist comments above.  - No cystic structure found in the popliteal fossa.  *See table(s) above for measurements and observations. Electronically signed by Curt Jews MD on 03/27/2020 at 8:02:40 PM.    Final    Marzetta Board, MD, PhD [facility]  Between 7 am - 7 pm I am available, please contact me via Amion or Securechat  Between 7 pm - 7 am I am not available, please contact night coverage MD/APP via Amion

## 2020-03-28 NOTE — Progress Notes (Signed)
Nutrition Follow-up  DOCUMENTATION CODES:   Severe malnutrition in context of social or environmental circumstances  INTERVENTION:   -Continue Magic cup TID with meals, each supplement provides 290 kcal and 9 grams of protein -Continue MVI with minerals daily -D/c Ensure Enlive -30 ml Prosource Plus TID, each supplement provides 100 kcals and 15 grams protein -Boost Breeze po TID, each supplement provides 250 kcal and 9 grams of protein  NUTRITION DIAGNOSIS:   Severe Malnutrition related to social / environmental circumstances as evidenced by percent weight loss,mild fat depletion,moderate fat depletion,mild muscle depletion,moderate muscle depletion.  Ongoing  GOAL:   Patient will meet greater than or equal to 90% of their needs  Unmet  MONITOR:   PO intake,Supplement acceptance,Labs,Weight trends,Skin,I & O's  REASON FOR ASSESSMENT:   Malnutrition Screening Tool    ASSESSMENT:   [name] [name] is a 74 y.o. female with medical history significant for DMT2, CKD, CHF, HTN, OA who presents by EMS. Her daughter had not been able to get in touch with [name] [name] for past two days and she called the police to do a safety check on her.  Found by police sitting on the floor, leaning forward in the central living space with bugs crawling on her. Patient was next to her walker sitting in her soiled clothes with excrement on the floor around her. Patient reportedly denied pain including chest pain, back pain, abdominal pain and SOB. Pt is now sleeping on gurney in ER and does not respond to questions or follow commands. ER provider reported that pt was initially confused when she arrived. Per chart review, patient seen on 03/08/20 by her PCP for intractable N/V x 4-6 weeks with profound weakness, lightheadedness.  12/16- s/ps BSE- downgraded to dysphagia 2 diet with thin liquids 12/17- s/p EGD- revealed grade C reflux esophagitis with no stricture, antral biopsies performed to ule out H.  Pylori, multiple non-bleeding duodenal ulcers  12/21- advanced to regular diet with thin liquids  Reviewed I/O's: -200 ml x 24 hours and +6 L since admission  UOP: 500 ml x 24 hours  Per MD notes, pt developed a fever on 03/26/20 (day of anticipated discharge). Pt currently on antibiotics. Suspect cause of fevers is either related to urinalysis or ulcers.   Intake has declined significantly. Noted meal completion 0-50% (averaging 25% of meals). Pt minimally accepting of Ensure supplements. Per MAR, pt prefers Boost Breeze supplements.   Per TOC notes, continue plan to d/c to SNF once medically stable.   Medications reviewed and include miralax and senna.  Labs reviewed: CBGS: 111 (inpatient orders for glycemic control are 0-5 units insulin asapart daily at beditme, and 0-9 units insulin aspart TID with meals).   Diet Order:   Diet Order            Diet Carb Modified Fluid consistency: Thin; Room service appropriate? Yes  Diet effective now                 EDUCATION NEEDS:   Not appropriate for education at this time  Skin:  Skin Assessment: Skin Integrity Issues: Skin Integrity Issues:: Other (Comment) Other: MASD sacrum, bilateral leg venous stasis ulcers  Last BM:  03/26/20  Height:   Ht Readings from Last 1 Encounters:  03/18/20 5\' 10"  (1.778 m)    Weight:   Wt Readings from Last 1 Encounters:  03/19/20 113.4 kg    Ideal Body Weight:  72.7 kg  BMI:  Body mass index is 35.87 kg/m.  Estimated Nutritional Needs:   Kcal:  2200-2400  Protein:  130-145 grams  Fluid:  > 2 L    [name] [name], RD, LDN, [place] Registered Dietitian II Certified Diabetes Care and Education Specialist Please refer to [facility] for RD and/or RD on-call/weekend/after hours pager

## 2020-03-28 NOTE — Plan of Care (Signed)

## 2020-03-28 NOTE — TOC Progression Note (Signed)
Transition of Care [facility]) - Progression Note    Patient Details  Name: [name] [name] MRN: [id number] Date of Birth: 08/26/1945  Transition of Care [facility]) CM/SW [place], [place] Phone Number: 03/28/2020, 1:21 PM  Clinical Narrative:     Per MD, pt is not stable for discharge today. [facility] is not accepting Saturday or Sunday admissions due to holiday.   Expected Discharge Plan: [place] Barriers to Discharge: Barriers Resolved  Expected Discharge Plan and Services Expected Discharge Plan: [place] In-house Referral: Clinical Social Work Discharge Planning Services: CM Consult   Living arrangements for the past 2 months: Single Family Home Expected Discharge Date: 03/26/20                                     Social Determinants of Health (SDOH) Interventions    Readmission Risk Interventions No flowsheet data found.  Emeterio Reeve, Latanya Presser, [place] Social Worker [phone]

## 2020-03-28 NOTE — Progress Notes (Signed)
Physical Therapy Treatment Patient Details Name: [name] [name] MRN: [id number] DOB: 1946/02/01 Today's Date: 03/28/2020    History of Present Illness Pt is a 74 y.o. female admitted 03/16/20 found down by EMS after daughter requested wellness check having not heard from her for a couple days. Workup for metabolic encephalopathy, ARF, dehydration. Pt with L hip pain, xray with moderate osteoarthritis. Head CT negative for acute abnormality; age-related atrophy. MRI suggestive of Wenicke encephalopathy. PMH includes CKD3, CHF, DM2, HTN, OA, chronic lymphedema, venous stasis ulcers. 12/21 Discharge cancelled pt spiked fever and HR elevated.    PT Comments    Pt with limited progress today.  She was lethargic and confused which limited participation.  Did sit  EOB for 8 mins but required total assist of 2 for transfers.  Also worked on bed mobility and repositioning to safer/more comfortable position. Pt was too weak, unbalance, and confused to participate with stands or pivots.  Needs hoyer with nursing for OOB.  Will continue to progress as able with PT.    Follow Up Recommendations  SNF;Supervision/Assistance - 24 hour     Equipment Recommendations  Rolling walker with 5" wheels;3in1 (PT);Wheelchair (measurements PT);Wheelchair cushion (measurements PT);Hospital bed (hoyer)    Recommendations for Other Services       Precautions / Restrictions Precautions Precautions: Fall    Mobility  Bed Mobility Overal bed mobility: Needs Assistance Bed Mobility: Supine to Sit;Sit to Supine;Rolling Rolling: Total assist;+2 for physical assistance   Supine to sit: Total assist;+2 for physical assistance Sit to supine: Total assist;+2 for physical assistance   General bed mobility comments: Requiring total assist for legs and trunk.  Tried to cue and position to assist but pt unable.  At arrival pt hunched over to R side and in side rail.  At end of session , was able to position straight in  bed with pillows to support  Transfers                 General transfer comment: Unable/unsafe to assist - not following commands, poor trunk control/balance  Ambulation/Gait                 Stairs             Wheelchair Mobility    Modified Rankin (Stroke Patients Only)       Balance Overall balance assessment: Needs assistance Sitting-balance support: Bilateral upper extremity supported Sitting balance-Leahy Scale: Poor Sitting balance - Comments: Requiring UE support and mod A at times.  She is able to maintain balance on her own but leaned forward with elbows on knees.  Sat EOB for 8 mins.  Had pt reach forward to apply lotion to legs.  PT washed back while pt up.  Cues for posture and trying to sit upright and hold head up but only able to do for 10-20 seconds at a time before leaning back on knees.     Standing balance-Leahy Scale: Zero                              Cognition Arousal/Alertness: Lethargic Behavior During Therapy: Anxious Overall Cognitive Status: Impaired/Different from baseline Area of Impairment: Problem solving;Safety/judgement;Following commands;Attention;Orientation;Awareness                 Orientation Level: Disoriented to;Place;Time;Situation Current Attention Level: Focused   Following Commands: Follows one step commands inconsistently Safety/Judgement: Decreased awareness of deficits;Decreased awareness of safety Awareness: Intellectual Problem Solving:  Slow processing;Decreased initiation;Difficulty sequencing;Requires verbal cues;Requires tactile cues General Comments: Pt only oriented to self but states she is 21 or 74 yo and at home.  Very lethargic, following <10% commands      Exercises General Exercises - Lower Extremity Ankle Circles/Pumps: AROM;Strengthening;Both;10 reps;Seated Long Arc Quad: AAROM;Strengthening;Both;10 reps;Seated    General Comments        Pertinent Vitals/Pain Pain  Assessment: Faces Faces Pain Scale: Hurts even more Pain Location: grimaces with movement - seems to be mostly R LE and shoulders Pain Descriptors / Indicators: Grimacing;Moaning;Guarding Pain Intervention(s): Limited activity within patient's tolerance;Monitored during session;Repositioned    Home Living                      Prior Function            PT Goals (current goals can now be found in the care plan section) Acute Rehab PT Goals Patient Stated Goal: unable PT Goal Formulation: With family Time For Goal Achievement: 04/02/20 Potential to Achieve Goals: Fair Progress towards PT goals: Not progressing toward goals - comment (limited by lethargy/confusion today)    Frequency    Min 2X/week      PT Plan Current plan remains appropriate    Co-evaluation              AM-PAC PT "6 Clicks" Mobility   Outcome Measure  Help needed turning from your back to your side while in a flat bed without using bedrails?: Total Help needed moving from lying on your back to sitting on the side of a flat bed without using bedrails?: Total Help needed moving to and from a bed to a chair (including a wheelchair)?: Total Help needed standing up from a chair using your arms (e.g., wheelchair or bedside chair)?: Total Help needed to walk in hospital room?: Total Help needed climbing 3-5 steps with a railing? : Total 6 Click Score: 6    End of Session   Activity Tolerance: Other (comment) (limited by lethargy/confusion) Patient left: in bed;with call bell/phone within reach;with bed alarm set (all rails up on air mattress) Nurse Communication: Mobility status;Need for lift equipment PT Visit Diagnosis: Other abnormalities of gait and mobility (R26.89);Muscle weakness (generalized) (M62.81)     Time: 1435-1500 PT Time Calculation (min) (ACUTE ONLY): 25 min  Charges:  $Therapeutic Exercise: 8-22 mins $Therapeutic Activity: 8-22 mins                     Abran [name],  PT Acute Rehab Services Pager [phone] [facility] [phone]     Karlton Lemon 03/28/2020, 3:18 PM

## 2020-03-28 NOTE — Progress Notes (Addendum)
Made patient aware that she had an order for a blood transfusion and that I needed her signed consent to administer before, patient said she would think on it and she would let me know later today/tonight. Charge nurse made aware that patient is refusing to get blood transfused at the moment.  If patient has not made her decision by end of my shift I will endorse over to night shift nurse.  [place] chat with Dr. Cruzita Lederer and made aware of patient current decision.

## 2020-03-29 DIAGNOSIS — N3000 Acute cystitis without hematuria: Secondary | ICD-10-CM | POA: Diagnosis not present

## 2020-03-29 LAB — BPAM RBC
Blood Product Expiration Date: 202201122359
ISSUE DATE / TIME: 202112232239
Unit Type and Rh: 7300

## 2020-03-29 LAB — CBC
HCT: 26.2 % — ABNORMAL LOW (ref 36.0–46.0)
Hemoglobin: 8.1 g/dL — ABNORMAL LOW (ref 12.0–15.0)
MCH: 24.9 pg — ABNORMAL LOW (ref 26.0–34.0)
MCHC: 30.9 g/dL (ref 30.0–36.0)
MCV: 80.6 fL (ref 80.0–100.0)
Platelets: 655 10*3/uL — ABNORMAL HIGH (ref 150–400)
RBC: 3.25 MIL/uL — ABNORMAL LOW (ref 3.87–5.11)
RDW: 18.7 % — ABNORMAL HIGH (ref 11.5–15.5)
WBC: 6.3 10*3/uL (ref 4.0–10.5)
nRBC: 0 % (ref 0.0–0.2)

## 2020-03-29 LAB — VITAMIN B1: Vitamin B1 (Thiamine): 28 nmol/L — ABNORMAL LOW (ref 66.5–200.0)

## 2020-03-29 LAB — TYPE AND SCREEN
ABO/RH(D): B POS
Antibody Screen: NEGATIVE
Unit division: 0

## 2020-03-29 LAB — GLUCOSE, CAPILLARY
Glucose-Capillary: 76 mg/dL (ref 70–99)
Glucose-Capillary: 84 mg/dL (ref 70–99)
Glucose-Capillary: 217 mg/dL — ABNORMAL HIGH (ref 70–99)
Glucose-Capillary: 151 mg/dL — ABNORMAL HIGH (ref 70–99)

## 2020-03-29 NOTE — Plan of Care (Signed)
?  Problem: Clinical Measurements: ?Goal: Ability to maintain clinical measurements within normal limits will improve ?Outcome: Progressing ?Goal: Will remain free from infection ?Outcome: Progressing ?Goal: Diagnostic test results will improve ?Outcome: Progressing ?  ?

## 2020-03-29 NOTE — Progress Notes (Signed)
Pt just finished getting blood. No transfusion reaction noted. Will monitor pt.

## 2020-03-29 NOTE — Progress Notes (Signed)
PROGRESS NOTE  [name] [name] [id number] DOB: [birth year] DOA: 03/16/2020 PCP: Hoyt Koch, MD   LOS: 12 days   Brief Narrative / Interim history: 74 year old female with history of type 2 diabetes mellitus, chronic kidney disease stage IIIa, CHF, chronic lymphedema, hypertension, osteoarthritis who was brought to the ED by EMS.  Daughter reported that she had not been able to get in touch with her mom for a couple of days so she called the police to do a safety check on her and found her laying on the floor covered with stool and bugs crawling all over her.  Per chart review, it appears that she was recently seen by her PCP for intractable nausea and vomiting for 4 to 6 weeks associated with profound weakness, was also being treated for UTI, also seen in the ED on 1 such occasion, had a CT abdomen and pelvis 11/19 which was unremarkable.  Labs notable for hyponatremia, creatinine of 3.4, mildly elevated CK, lactate 2.1, abnormal urinalysis and CT head noted age-related atrophy.  She was admitted for acute metabolic encephalopathy. Mental status is gradually improved. Concerned that dysphagia is related to mechanical issues i.e. history of Schatzki's ring dilatation in the past.  S/p EGD 12/18: Reflux esophagitis, multiple large and small nonbleeding duodenal ulcers.  Subjective / 24h Interval events: No complaints this morning  Assessment & Plan: Principal Problem Acute metabolic encephalopathy - this was suspected to be multifactorial from acute kidney injury, dehydration, hypernatremia as well as concern for Warnicke's encephalopathy.  She was initially placed on IV ceftriaxone due to concern for UTI but cultures resulted only 20 K colonies of Enterococcus.  She received 3 days of intravenous antibiotics followed by dose of fosfomycin on 12/14.  She has remained stable.  CT head, ammonia, TSH, B12, and cortisol were unremarkable.  She underwent an MRI of the brain due to slurred  speech with findings highly suggestive of Wernicke's encephalopathy.  B1 level was ordered but pending at the time of discharge, she is status post high-dose IV thiamine with improvement in her mental status.  Continue thiamine   Active Problems Fever -patient was supposed to be discharged to SNF on 12/21 however in the afternoon prior to discharge she developed a fever.  Source is not entirely clear, she did have a UTI with Enterococcus which has been treated with fosfomycin.  Urinalysis repeated showed rare bacteria and moderate leukocytes.  Another potential source would be her sacral ulcers although they do look quite superficial. MRI of the pelvis doesn't show any deep abscesses however raises the possibility of underlying cellulitis with local lymphadenopathy.  Started on doxycycline 12/23, fever now resolved.  If she remains afebrile could potentially go to SNF tomorrow -D dimer elevated, DVT US negative for DVT, VQ scan negative for PE Chronic nausea and vomiting, dysphagia-unclear etiology, recent CT scan of the abdomen and pelvis was unremarkable, TSH and cortisol normal.  GI was consulted and underwent an EGD on 12/18 which showed LA grade C reflux esophagitis in the distal one thirds of the esophagus, no stricture noted. Normal stomach. Multiple large and small nonbleeding duodenal ulcers with pigmented material in the bulb and post bulbar area.  Placed on PPI, Carafate.  Avoid NSAIDs.  Speech evaluated and recommended dysphagia 2 diet and thin liquids> resolved Acute kidney injury on chronic kidney disease stage IIIa -Baseline creatinine around 1.5, improved with fluids, now stable off IVF Hypernatremia-due to dehydration, received IV fluids and sodium improved.  Continues to improve off  IV fluids suggesting adequate p.o. intake Hypokalemia, hypomagnesemia, hypophosphatemia -continue to replete and check periodically Anemia-likely dilutional as she received IV fluids, no bleeding other than  intermittent oozing at her sacral ulcers, overall stable. Trending down, possibly in the setting of underlying infectious process.  Status post 1 unit of packed red blood cells Thrombocytopenia-unclear etiology, her heparin has been stopped and HIT antibodies were sent and returned negative Left hip pain with movement-x-rays suggest moderate osteoarthritis, supportive treatment Type 2 diabetes mellitus-resume home medications, A1c 6.8 Hypertension-controlled off antihypertensives.  See discussion above about ARB and Lasix Chronic lymphedema-supportive local care Sacral decubitus ulcers-secondary to moisture associated skin damage.  Concern for underlying cellulitis now on doxycycline  Scheduled Meds: . (feeding supplement) PROSource Plus  30 mL Oral TID WC  . sodium chloride   Intravenous Once  . doxycycline  100 mg Oral Q12H  . enoxaparin (LOVENOX) injection  40 mg Subcutaneous Q24H  . feeding supplement  1 Container Oral TID BM  . free water  100 mL Oral Q4H  . insulin aspart  0-5 Units Subcutaneous QHS  . insulin aspart  0-9 Units Subcutaneous TID WC  . multivitamin with minerals  1 tablet Oral Daily  . pantoprazole  40 mg Oral BID  . polyethylene glycol  17 g Oral BID  . senna  2 tablet Oral Daily  . sucralfate  1 g Oral TID WC & HS   Continuous Infusions:  PRN Meds:.acetaminophen **OR** acetaminophen, HYDROmorphone (DILAUDID) injection, ondansetron  Diet Orders (From admission, onward)    Start     Ordered   03/25/20 1031  Diet Carb Modified Fluid consistency: Thin; Room service appropriate? Yes  Diet effective now       Question Answer Comment  Diet-HS Snack? Nothing   Calorie Level Medium 1600-2000   Fluid consistency: Thin   Room service appropriate? Yes      03/25/20 1030          DVT prophylaxis: enoxaparin (LOVENOX) injection 40 mg Start: 03/27/20 2000     Code Status: Full Code  Family Communication: daughter over the phone   Status is:  Inpatient  Remains inpatient appropriate because:Inpatient level of care appropriate due to severity of illness   Dispo: The patient is from: Home              Anticipated d/c is to: SNF              Anticipated d/c date is: 2 days              Patient currently is not medically stable to d/c.  Consultants:  GI  Procedures:  As above  Microbiology  Urine cultures - Enterococcus  Antimicrobials: Fosfomycin    Objective: Vitals:   03/28/20 2224 03/28/20 2316 03/29/20 0100 03/29/20 0444  BP: (!) 109/59 112/60 104/61 115/64  Pulse: 95 99 93 98  Resp: 17 17 17 17   Temp: 98.9 F (37.2 C) 98.8 F (37.1 C) 98.9 F (37.2 C) 98.8 F (37.1 C)  TempSrc:      SpO2: 98% 99% 96% 99%  Weight:      Height:        Intake/Output Summary (Last 24 hours) at 03/29/2020 0949 Last data filed at 03/29/2020 0600 Gross per 24 hour  Intake 490 ml  Output 250 ml  Net 240 ml   Filed Weights   03/19/20 1100  Weight: 113.4 kg    Examination:  Constitutional: No distress Eyes: No scleral icterus ENMT:  Moist mucous membranes Neck: normal, supple Respiratory: Clear bilaterally, no wheezing Cardiovascular: Regular rate and rhythm, no murmurs, no edema Abdomen: Soft, nontender, nondistended, bowel sounds positive Musculoskeletal: no clubbing / cyanosis.  Skin: No rashes Neurologic: No focal deficits  Data Reviewed: I have independently reviewed following labs and imaging studies   CBC: Recent Labs  Lab 03/25/20 0837 03/26/20 0141 03/27/20 1253 03/28/20 0103 03/29/20 0501  WBC 8.9 8.2 7.1 6.4 6.3  HGB 9.2* 8.7* 8.0* 7.4* 8.1*  HCT 29.6* 26.7* 25.1* 23.0* 26.2*  MCV 79.8* 79.2* 78.0* 78.8* 80.6  PLT 125* 193 436* 484* [id number]*   Basic Metabolic Panel: Recent Labs  Lab 03/23/20 0402 03/24/20 0152 03/25/20 0027 03/25/20 0837 03/26/20 0141 03/27/20 1253 03/28/20 0103  NA 146* 144  --  143 141 140 142  K 4.0 3.4*  --  4.4 3.7 3.7 3.5  CL 110 109  --  109 107 106 109   CO2 27 25  --  24 24 21* 26  GLUCOSE 89 106*  --  100* 118* 141* 105*  BUN 22 18  --  18 22 22 21   CREATININE 1.26* 1.19*  --  1.31* 1.47* 1.47* 1.44*  CALCIUM 8.6* 8.7*  --  8.6* 8.5* 8.3* 8.2*  MG  --   --   --   --  1.6*  --   --   PHOS 2.6 2.8 2.8  --  3.2  --   --    Liver Function Tests: Recent Labs  Lab 03/23/20 0402 03/24/20 0152 03/26/20 0141 03/28/20 0103  AST  --   --   --  26  ALT  --   --   --  18  ALKPHOS  --   --   --  99  BILITOT  --   --   --  0.5  PROT  --   --   --  5.2*  ALBUMIN 2.1* 2.1* 1.8* 1.6*   Coagulation Profile: No results for input(s): INR, PROTIME in the last 168 hours. HbA1C: No results for input(s): HGBA1C in the last 72 hours. CBG: Recent Labs  Lab 03/28/20 0740 03/28/20 1208 03/28/20 1725 03/28/20 2008 03/29/20 0753  GLUCAP 97 111* 95 91 76    Recent Results (from the past 240 hour(s))  Resp Panel by RT-PCR (Flu A&B, Covid) Nasopharyngeal Swab     Status: None   Collection Time: 03/26/20 11:19 AM   Specimen: Nasopharyngeal Swab; Nasopharyngeal(NP) swabs in vial transport medium  Result Value Ref Range Status   SARS Coronavirus 2 by RT PCR NEGATIVE NEGATIVE Final    Comment: (NOTE) SARS-CoV-2 target nucleic acids are NOT DETECTED.  The SARS-CoV-2 RNA is generally detectable in upper respiratory specimens during the acute phase of infection. The lowest concentration of SARS-CoV-2 viral copies this assay can detect is 138 copies/mL. A negative result does not preclude SARS-Cov-2 infection and should not be used as the sole basis for treatment or other patient management decisions. A negative result may occur with  improper specimen collection/handling, submission of specimen other than nasopharyngeal swab, presence of viral mutation(s) within the areas targeted by this assay, and inadequate number of viral copies(<138 copies/mL). A negative result must be combined with clinical observations, patient history, and  epidemiological information. The expected result is Negative.  Fact Sheet for Patients:  EntrepreneurPulse.com.au  Fact Sheet for Healthcare Providers:  IncredibleEmployment.be  This test is no t yet approved or cleared by the [place] FDA and  has been  authorized for detection and/or diagnosis of SARS-CoV-2 by FDA under an Emergency Use Authorization (EUA). This EUA will remain  in effect (meaning this test can be used) for the duration of the COVID-19 declaration under Section 564(b)(1) of the Act, 21 U.S.C.section 360bbb-3(b)(1), unless the authorization is terminated  or revoked sooner.       Influenza A by PCR NEGATIVE NEGATIVE Final   Influenza B by PCR NEGATIVE NEGATIVE Final    Comment: (NOTE) The Xpert Xpress SARS-CoV-2/FLU/RSV plus assay is intended as an aid in the diagnosis of influenza from Nasopharyngeal swab specimens and should not be used as a sole basis for treatment. Nasal washings and aspirates are unacceptable for Xpert Xpress SARS-CoV-2/FLU/RSV testing.  Fact Sheet for Patients: EntrepreneurPulse.com.au  Fact Sheet for Healthcare Providers: IncredibleEmployment.be  This test is not yet approved or cleared by the [place] FDA and has been authorized for detection and/or diagnosis of SARS-CoV-2 by FDA under an Emergency Use Authorization (EUA). This EUA will remain in effect (meaning this test can be used) for the duration of the COVID-19 declaration under Section 564(b)(1) of the Act, 21 U.S.C. section 360bbb-3(b)(1), unless the authorization is terminated or revoked.  Performed at [facility], [place] [address], [place], [place] [address]      Radiology Studies: DG Chest 2 View  Result Date: 03/28/2020 CLINICAL DATA:  Chest pain EXAM: CHEST - 2 VIEW COMPARISON:  03/26/2020 FINDINGS: Cardiac shadow is within normal limits. The lungs are well aerated bilaterally.  No focal infiltrate or sizable effusion is seen. Mild right basilar atelectasis is again noted. No bony abnormality is seen. IMPRESSION: Mild right basilar atelectasis. Electronically Signed   By: Inez Catalina M.D.   On: 03/28/2020 10:28   NM Pulmonary Perf and Vent  Result Date: 03/28/2020 CLINICAL DATA:  Positive D-dimer. Fever. Assess for pulmonary emboli. EXAM: NUCLEAR MEDICINE PERFUSION LUNG SCAN TECHNIQUE: Perfusion images were obtained in multiple projections after intravenous injection of radiopharmaceutical. Ventilation scans intentionally deferred if perfusion scan and chest x-ray adequate for interpretation during COVID 19 epidemic. RADIOPHARMACEUTICALS:  5.12 mCi Tc-51m MAA IV COMPARISON:  Chest radiography same day FINDINGS: Perfusion study appears normal. No defects to suggest pulmonary emboli. IMPRESSION: Normal perfusion study.  No defects to suggest pulmonary emboli. Electronically Signed   By: Nelson Chimes M.D.   On: 03/28/2020 11:32   Marzetta Board, MD, PhD [facility]  Between 7 am - 7 pm I am available, please contact me via Amion or Securechat  Between 7 pm - 7 am I am not available, please contact night coverage MD/APP via Amion

## 2020-03-30 ENCOUNTER — Inpatient Hospital Stay (HOSPITAL_COMMUNITY): Payer: PPO

## 2020-03-30 DIAGNOSIS — N3000 Acute cystitis without hematuria: Secondary | ICD-10-CM | POA: Diagnosis not present

## 2020-03-30 LAB — GLUCOSE, CAPILLARY
Glucose-Capillary: 129 mg/dL — ABNORMAL HIGH (ref 70–99)
Glucose-Capillary: 93 mg/dL (ref 70–99)
Glucose-Capillary: 115 mg/dL — ABNORMAL HIGH (ref 70–99)
Glucose-Capillary: 150 mg/dL — ABNORMAL HIGH (ref 70–99)

## 2020-03-30 IMAGING — CT CT HIP*R* W/O CM
2 of 3 series · 16 of 46 positions shown, 18 images · non-contrast
Comparison: MRI [DATE], CT [DATE], [DATE]

CLINICAL DATA: Right hip pain, fever

EXAM:
CT OF THE RIGHT HIP WITHOUT CONTRAST
TECHNIQUE: Multidetector CT imaging of the right hip was performed according to
the standard protocol. Multiplanar CT image reconstructions were
also generated.

[Series 7: pelvis 2.0 st · axial · 0.51mm/px · z∈[+561,+723]mm · 13 of 94 slices shown, 15 images]
[im 7/94  soft-tissue]
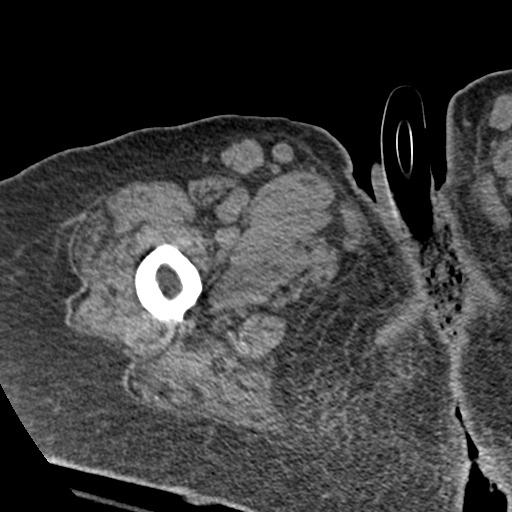
[im 7/94  bone]
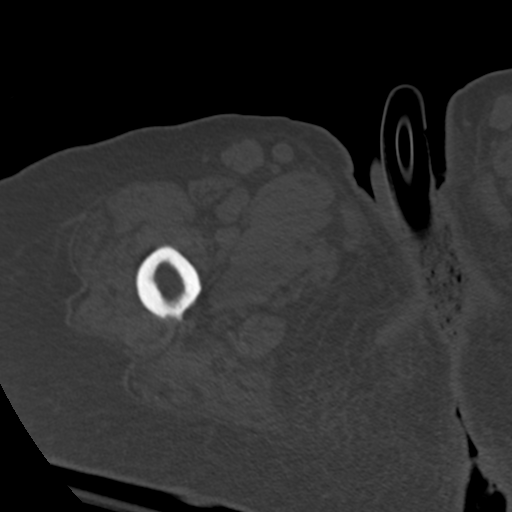
[im 13/94  soft-tissue]
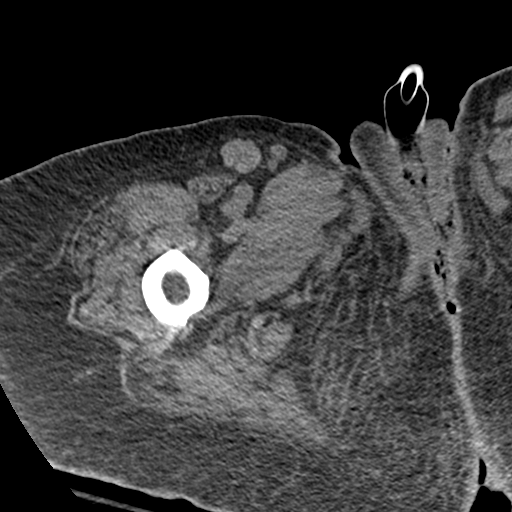
[im 19/94  soft-tissue]
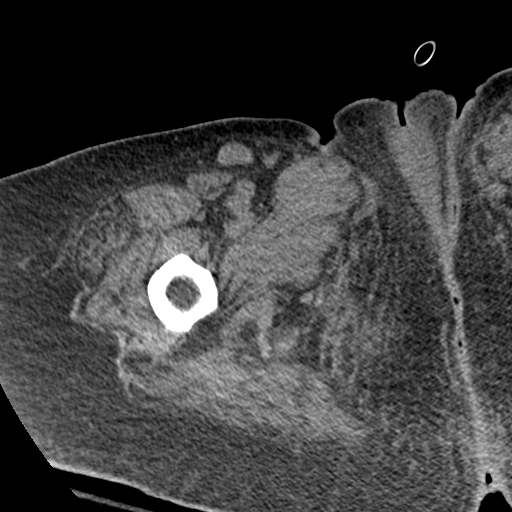
[im 28/94  soft-tissue]
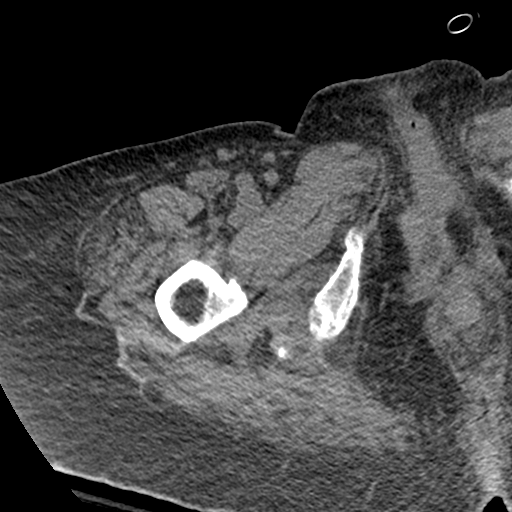
[im 34/94  soft-tissue]
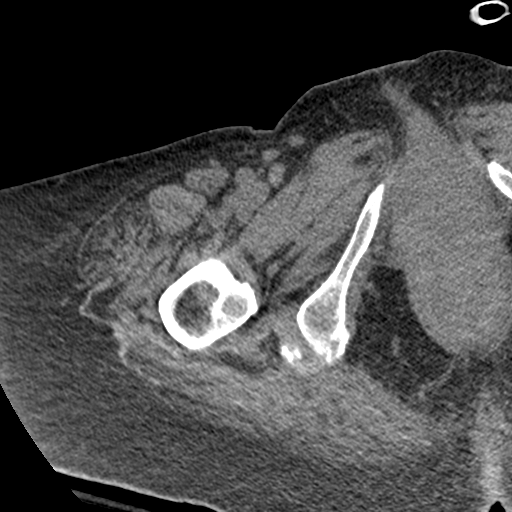
[im 40/94  soft-tissue]
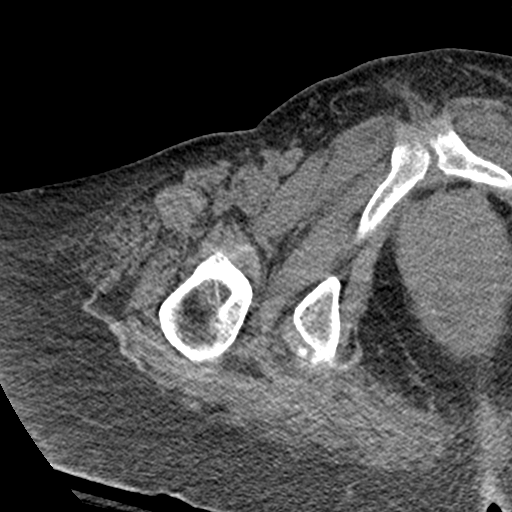
[im 49/94  soft-tissue]
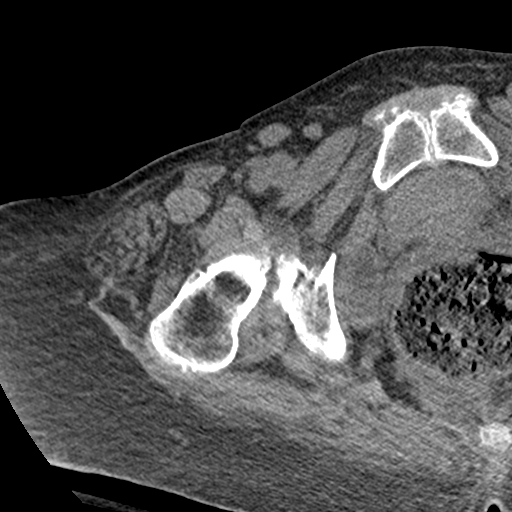
[im 55/94  soft-tissue]
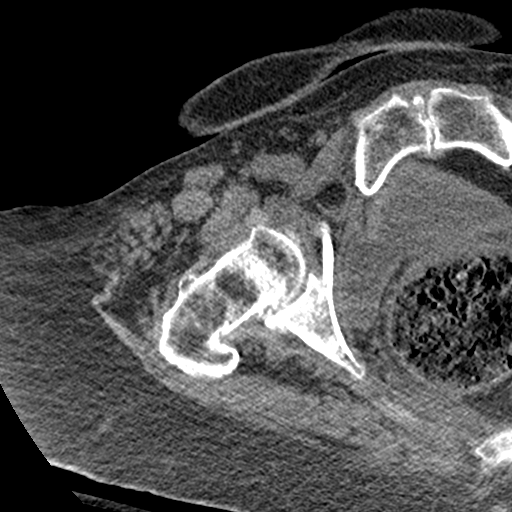
[im 61/94  soft-tissue]
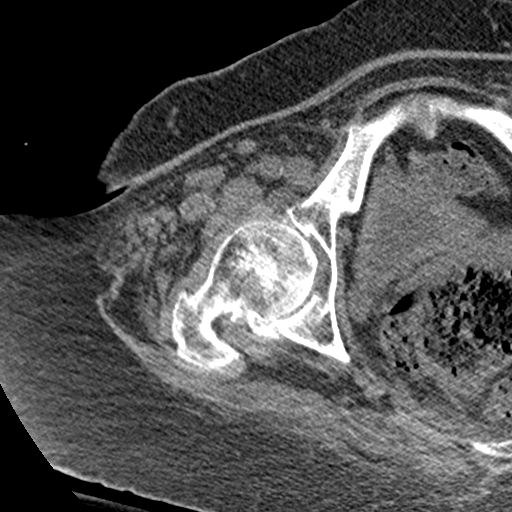
[im 61/94  bone]
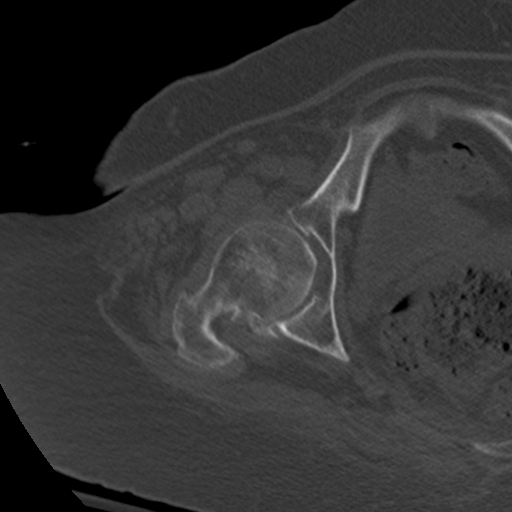
[im 67/94  soft-tissue]
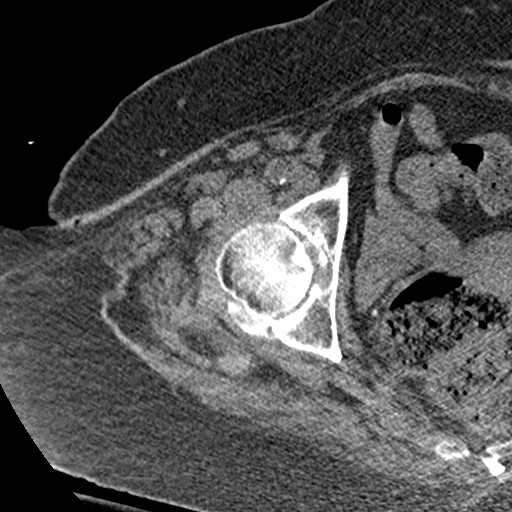
[im 76/94  soft-tissue]
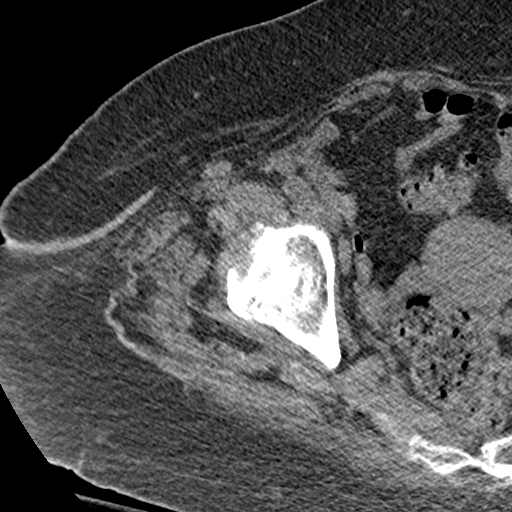
[im 82/94  soft-tissue]
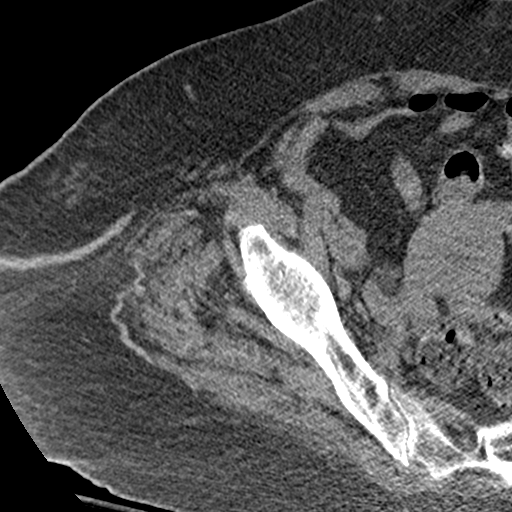
[im 88/94  soft-tissue]
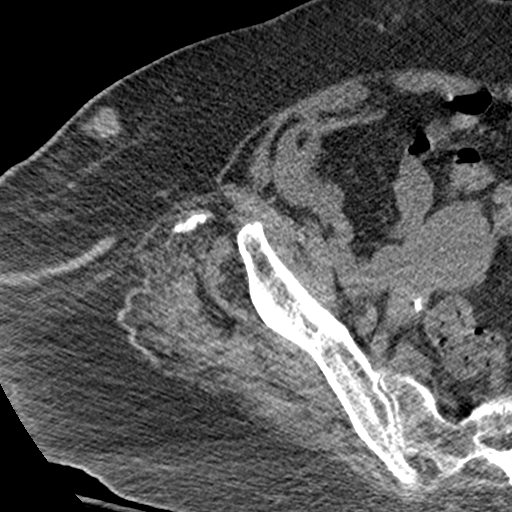

[Series 8: pelvis 2.0 cor. bone · coronal · 0.51mm/px · 3 of 189 slices shown]
[im 63/189  soft-tissue]
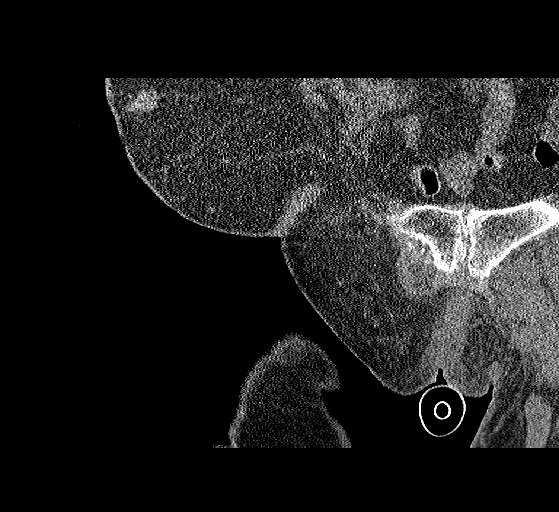
[im 84/189  soft-tissue]
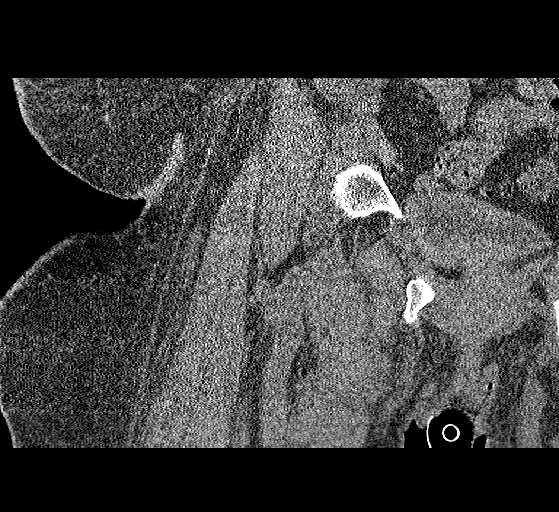
[im 105/189  soft-tissue]
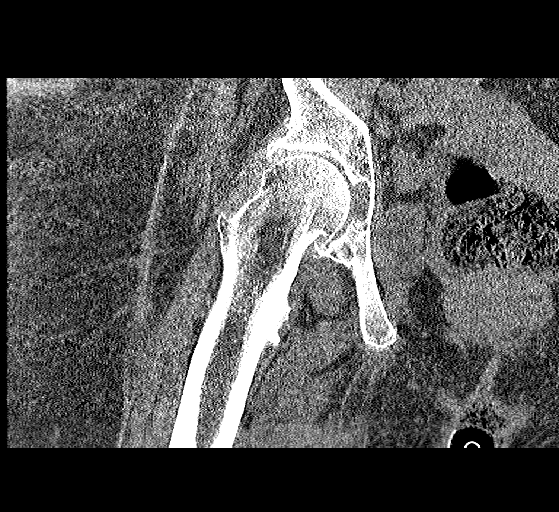

[16 of 46 positions shown; findings below may reference images not displayed]

FINDINGS: Bones/Joint/Cartilage

No acute fracture. No dislocation. Moderate osteoarthritis of the
right hip as manifested by joint space narrowing, subchondral
sclerosis/cystic change, and marginal osteophyte formation. Small
right hip joint effusion, nonspecific (series 7, image 41). No
evidence of femoral head avascular necrosis. Degenerative changes of
the pubic symphysis with chondrocalcinosis. No lytic or sclerotic
bone lesion identified.

Ligaments

Suboptimally assessed by CT.

Muscles and Tendons

No acute musculotendinous abnormality by CT. No intramuscular fluid
collection.

Soft tissues

Mild induration within the subcutaneous fat of the infra gluteal
soft tissues. No soft tissue fluid collection or abscess. Multiple
mildly enlarged right inguinal lymph nodes, largest measuring up to
1.7 cm short axis (series 7, image 86). There are partially
visualized mildly enlarged left inguinal lymph nodes. Inguinal nodes
are similar in size to previous CT. Scattered areas of nodularity
within the subcutaneous tissues of the lower anterior abdominal wall
likely reflecting injection granulomas.

Diverticular change within the visualized sigmoid colon. Moderate
volume of stool within the rectosigmoid colon. No acute findings
within the visualized portion of the pelvis.
IMPRESSION: 1. No acute osseous abnormality of the right hip.
2. Mild nonspecific induration within the subcutaneous fat. No soft
tissue fluid collection or abscess
3. Moderate osteoarthritis of the right hip with small right hip
joint effusion. Effusion was present on multiple previous studies
including previous CT from [DATE] and is likely reactive
secondary to osteoarthritis. However, if there is a high clinical
suspicion for septic arthritis, arthrocentesis should be considered.
4. Multiple mildly enlarged bilateral inguinal lymph nodes are
similar to previous studies.
5. Moderate volume of stool within the rectosigmoid colon.

## 2020-03-30 MED ORDER — ACETAMINOPHEN-CODEINE #3 300-30 MG PO TABS
1.0000 | ORAL_TABLET | ORAL | Status: DC | PRN
  Administered 2020-03-31 – 2020-04-02 (×4): 1 via ORAL
  Filled 2020-03-30 (×4): qty 1

## 2020-03-30 NOTE — Progress Notes (Signed)
   03/30/20 1600  Assess: MEWS Score  Temp (!) 101.4 F (38.6 C)  BP (!) 105/56  Pulse Rate (!) 108  Level of Consciousness Alert  SpO2 98 %  O2 Device Room Air  Assess: MEWS Score  MEWS Temp 1  MEWS Systolic 0  MEWS Pulse 1  MEWS RR 0  MEWS LOC 0  MEWS Score 2  MEWS Score Color Yellow  Assess: if the MEWS score is Yellow or Red  Were vital signs taken at a resting state? Yes  Focused Assessment No change from prior assessment  Early Detection of Sepsis Score *See Row Information* Low  MEWS guidelines implemented *See Row Information* No, other (Comment)  Treat  MEWS Interventions Administered prn meds/treatments  Multiple Pain Sites Yes  Breathing 0  Negative Vocalization 0  Facial Expression 0  Body Language 0  Consolability 0  PAINAD Score 0  Complains of Other (Comment) (Joint pain)  Interventions Medication (see MAR)  Patients response to intervention Effective  Take Vital Signs  Increase Vital Sign Frequency  Yellow: Q 2hr X 2 then Q 4hr X 2, if remains yellow, continue Q 4hrs  Escalate  MEWS: Escalate Yellow: discuss with charge nurse/RN and consider discussing with provider and RRT  Notify: Charge Nurse/RN  Name of Charge Nurse/RN Notified Remo Lipps  Date Charge Nurse/RN Notified 03/30/20  Time Charge Nurse/RN Notified 1600  Notify: Provider  Provider Name/Title Marzetta Board  Date Provider Notified 03/30/20  Time Provider Notified [phone]  Notification Type Page (Secure chat)  Notification Reason Other (Comment) (Per protocol)  Response No new orders  Date of Provider Response 03/30/20  Time of Provider Response 1839   Patient had a fever of 101.4, BP 105/56, pulse 108. Yellow MEWS protocol initiated. Q2 hour v/s for 4 hours, then Q4 v/s. Patient had no new symptoms, LOC intact and unchanged. Patient given pharmaceutical interventions, temperature resolved. V/S now stable as of 1841. MD made aware via secure chat. Will continue to monitor V/S.

## 2020-03-30 NOTE — Progress Notes (Signed)
PROGRESS NOTE  [name] [name] [id number] DOB: Aug 22, 1945 DOA: 03/16/2020 PCP: Hoyt Koch, MD   LOS: 13 days   Brief Narrative / Interim history: 74 year old female with history of type 2 diabetes mellitus, chronic kidney disease stage IIIa, CHF, chronic lymphedema, hypertension, osteoarthritis who was brought to the ED by EMS.  Daughter reported that she had not been able to get in touch with her mom for a couple of days so she called the police to do a safety check on her and found her laying on the floor covered with stool and bugs crawling all over her.  Per chart review, it appears that she was recently seen by her PCP for intractable nausea and vomiting for 4 to 6 weeks associated with profound weakness, was also being treated for UTI, also seen in the ED on 1 such occasion, had a CT abdomen and pelvis 11/19 which was unremarkable.  Labs notable for hyponatremia, creatinine of 3.4, mildly elevated CK, lactate 2.1, abnormal urinalysis and CT head noted age-related atrophy.  She was admitted for acute metabolic encephalopathy. Mental status is gradually improved. Concerned that dysphagia is related to mechanical issues i.e. history of Schatzki's ring dilatation in the past.  S/p EGD 12/18: Reflux esophagitis, multiple large and small nonbleeding duodenal ulcers.  Subjective / 24h Interval events: Doing well, no significant complaints  Assessment & Plan: Principal Problem Acute metabolic encephalopathy - this was suspected to be multifactorial from acute kidney injury, dehydration, hypernatremia as well as concern for Warnicke's encephalopathy.  She was initially placed on IV ceftriaxone due to concern for UTI but cultures resulted only 20 K colonies of Enterococcus.  She received 3 days of intravenous antibiotics followed by dose of fosfomycin on 12/14.  She has remained stable.  CT head, ammonia, TSH, B12, and cortisol were unremarkable.  She underwent an MRI of the brain due to  slurred speech with findings highly suggestive of Wernicke's encephalopathy.  B1 level was ordered but pending at the time of discharge, she is status post high-dose IV thiamine with improvement in her mental status.  Continue thiamine   Active Problems Fever -patient was supposed to be discharged to SNF on 12/21 however in the afternoon prior to discharge she developed a fever.  Source is not entirely clear, she did have a UTI with Enterococcus which has been treated with fosfomycin.  Urinalysis repeated showed rare bacteria and moderate leukocytes.  Another potential source would be her sacral ulcers although they do look quite superficial. MRI of the pelvis doesn't show any deep abscesses however raises the possibility of underlying cellulitis with local lymphadenopathy.  Started on doxycycline 12/23, still had a temp last night but overall fever curve improving.  Continue antibiotics -D dimer elevated, DVT US negative for DVT, VQ scan negative for PE Chronic nausea and vomiting, dysphagia-unclear etiology, recent CT scan of the abdomen and pelvis was unremarkable, TSH and cortisol normal.  GI was consulted and underwent an EGD on 12/18 which showed LA grade C reflux esophagitis in the distal one thirds of the esophagus, no stricture noted. Normal stomach. Multiple large and small nonbleeding duodenal ulcers with pigmented material in the bulb and post bulbar area.  Placed on PPI, Carafate.  Avoid NSAIDs.  Speech evaluated and recommended dysphagia 2 diet and thin liquids> resolved Acute kidney injury on chronic kidney disease stage IIIa -Baseline creatinine around 1.5, improved with fluids, now stable off IVF Hypernatremia-due to dehydration, received IV fluids and sodium improved.  Continues to improve  off IV fluids suggesting adequate p.o. intake Hypokalemia, hypomagnesemia, hypophosphatemia -continue to replete and check periodically Anemia-likely dilutional as she received IV fluids, no bleeding  other than intermittent oozing at her sacral ulcers, overall stable. Trending down, possibly in the setting of underlying infectious process.  Status post 1 unit of packed red blood cells.  Hemoglobin stable Thrombocytopenia-unclear etiology, her heparin has been stopped and HIT antibodies were sent and returned negative.  Platelets are stable Left hip pain with movement-x-rays suggest moderate osteoarthritis, supportive treatment Type 2 diabetes mellitus-resume home medications, A1c 6.8 Hypertension-controlled off antihypertensives.  See discussion above about ARB and Lasix Chronic lymphedema-supportive local care Sacral decubitus ulcers-secondary to moisture associated skin damage.  Concern for underlying cellulitis now on doxycycline  Scheduled Meds: . (feeding supplement) PROSource Plus  30 mL Oral TID WC  . sodium chloride   Intravenous Once  . doxycycline  100 mg Oral Q12H  . enoxaparin (LOVENOX) injection  40 mg Subcutaneous Q24H  . feeding supplement  1 Container Oral TID BM  . free water  100 mL Oral Q4H  . insulin aspart  0-5 Units Subcutaneous QHS  . insulin aspart  0-9 Units Subcutaneous TID WC  . multivitamin with minerals  1 tablet Oral Daily  . pantoprazole  40 mg Oral BID  . polyethylene glycol  17 g Oral BID  . senna  2 tablet Oral Daily  . sucralfate  1 g Oral TID WC & HS   Continuous Infusions:  PRN Meds:.acetaminophen **OR** acetaminophen, HYDROmorphone (DILAUDID) injection, ondansetron  Diet Orders (From admission, onward)    Start     Ordered   03/25/20 1031  Diet Carb Modified Fluid consistency: Thin; Room service appropriate? Yes  Diet effective now       Question Answer Comment  Diet-HS Snack? Nothing   Calorie Level Medium 1600-2000   Fluid consistency: Thin   Room service appropriate? Yes      03/25/20 1030          DVT prophylaxis: enoxaparin (LOVENOX) injection 40 mg Start: 03/27/20 2000     Code Status: Full Code  Family Communication:  daughter over the phone   Status is: Inpatient  Remains inpatient appropriate because:Inpatient level of care appropriate due to severity of illness   Dispo: The patient is from: Home              Anticipated d/c is to: SNF              Anticipated d/c date is: 2 days              Patient currently is not medically stable to d/c.  Consultants:  GI  Procedures:  As above  Microbiology  Urine cultures - Enterococcus  Antimicrobials: Fosfomycin    Objective: Vitals:   03/29/20 0444 03/29/20 1511 03/29/20 2052 03/30/20 0512  BP: 115/64 110/78 113/68 115/74  Pulse: 98 (!) 103 99 (!) 105  Resp: 17 17 16 18   Temp: 98.8 F (37.1 C) 98.8 F (37.1 C) (!) 100.7 F (38.2 C) 99.8 F (37.7 C)  TempSrc:  Oral Oral Oral  SpO2: 99% 99% 99% 96%  Weight:      Height:        Intake/Output Summary (Last 24 hours) at 03/30/2020 0913 Last data filed at 03/29/2020 1512 Gross per 24 hour  Intake 120 ml  Output --  Net 120 ml   Filed Weights   03/19/20 1100  Weight: 113.4 kg    Examination:  Constitutional: NAD Eyes: No icterus ENMT mmm Neck: normal, supple Respiratory: Clear bilaterally, no wheezing Cardiovascular: Regular rate and rhythm, no murmurs Abdomen: Soft, NT, ND, bowel sounds positive Musculoskeletal: no clubbing / cyanosis.  Skin: No rashes seen Neurologic: Nonfocal  Data Reviewed: I have independently reviewed following labs and imaging studies   CBC: Recent Labs  Lab 03/25/20 0837 03/26/20 0141 03/27/20 1253 03/28/20 0103 03/29/20 0501  WBC 8.9 8.2 7.1 6.4 6.3  HGB 9.2* 8.7* 8.0* 7.4* 8.1*  HCT 29.6* 26.7* 25.1* 23.0* 26.2*  MCV 79.8* 79.2* 78.0* 78.8* 80.6  PLT 125* 193 436* 484* [id number]*   Basic Metabolic Panel: Recent Labs  Lab 03/24/20 0152 03/25/20 0027 03/25/20 0837 03/26/20 0141 03/27/20 1253 03/28/20 0103  NA 144  --  143 141 140 142  K 3.4*  --  4.4 3.7 3.7 3.5  CL 109  --  109 107 106 109  CO2 25  --  24 24 21* 26  GLUCOSE 106*   --  100* 118* 141* 105*  BUN 18  --  18 22 22 21   CREATININE 1.19*  --  1.31* 1.47* 1.47* 1.44*  CALCIUM 8.7*  --  8.6* 8.5* 8.3* 8.2*  MG  --   --   --  1.6*  --   --   PHOS 2.8 2.8  --  3.2  --   --    Liver Function Tests: Recent Labs  Lab 03/24/20 0152 03/26/20 0141 03/28/20 0103  AST  --   --  26  ALT  --   --  18  ALKPHOS  --   --  99  BILITOT  --   --  0.5  PROT  --   --  5.2*  ALBUMIN 2.1* 1.8* 1.6*   Coagulation Profile: No results for input(s): INR, PROTIME in the last 168 hours. HbA1C: No results for input(s): HGBA1C in the last 72 hours. CBG: Recent Labs  Lab 03/29/20 0753 03/29/20 1201 03/29/20 1643 03/29/20 2105 03/30/20 0824  GLUCAP 76 84 217* 151* 129*    Recent Results (from the past 240 hour(s))  Resp Panel by RT-PCR (Flu A&B, Covid) Nasopharyngeal Swab     Status: None   Collection Time: 03/26/20 11:19 AM   Specimen: Nasopharyngeal Swab; Nasopharyngeal(NP) swabs in vial transport medium  Result Value Ref Range Status   SARS Coronavirus 2 by RT PCR NEGATIVE NEGATIVE Final    Comment: (NOTE) SARS-CoV-2 target nucleic acids are NOT DETECTED.  The SARS-CoV-2 RNA is generally detectable in upper respiratory specimens during the acute phase of infection. The lowest concentration of SARS-CoV-2 viral copies this assay can detect is 138 copies/mL. A negative result does not preclude SARS-Cov-2 infection and should not be used as the sole basis for treatment or other patient management decisions. A negative result may occur with  improper specimen collection/handling, submission of specimen other than nasopharyngeal swab, presence of viral mutation(s) within the areas targeted by this assay, and inadequate number of viral copies(<138 copies/mL). A negative result must be combined with clinical observations, patient history, and epidemiological information. The expected result is Negative.  Fact Sheet for Patients:   EntrepreneurPulse.com.au  Fact Sheet for Healthcare Providers:  IncredibleEmployment.be  This test is no t yet approved or cleared by the [place] FDA and  has been authorized for detection and/or diagnosis of SARS-CoV-2 by FDA under an Emergency Use Authorization (EUA). This EUA will remain  in effect (meaning this test can be used) for the duration  of the COVID-19 declaration under Section 564(b)(1) of the Act, 21 U.S.C.section 360bbb-3(b)(1), unless the authorization is terminated  or revoked sooner.       Influenza A by PCR NEGATIVE NEGATIVE Final   Influenza B by PCR NEGATIVE NEGATIVE Final    Comment: (NOTE) The Xpert Xpress SARS-CoV-2/FLU/RSV plus assay is intended as an aid in the diagnosis of influenza from Nasopharyngeal swab specimens and should not be used as a sole basis for treatment. Nasal washings and aspirates are unacceptable for Xpert Xpress SARS-CoV-2/FLU/RSV testing.  Fact Sheet for Patients: EntrepreneurPulse.com.au  Fact Sheet for Healthcare Providers: IncredibleEmployment.be  This test is not yet approved or cleared by the [place] FDA and has been authorized for detection and/or diagnosis of SARS-CoV-2 by FDA under an Emergency Use Authorization (EUA). This EUA will remain in effect (meaning this test can be used) for the duration of the COVID-19 declaration under Section 564(b)(1) of the Act, 21 U.S.C. section 360bbb-3(b)(1), unless the authorization is terminated or revoked.  Performed at [facility], [place] [address], [place], [place] [address]      Radiology Studies: No results found. Marzetta Board, MD, PhD [facility]  Between 7 am - 7 pm I am available, please contact me via Amion or Securechat  Between 7 pm - 7 am I am not available, please contact night coverage MD/APP via Amion

## 2020-03-31 ENCOUNTER — Inpatient Hospital Stay (HOSPITAL_COMMUNITY): Payer: PPO

## 2020-03-31 DIAGNOSIS — N3000 Acute cystitis without hematuria: Secondary | ICD-10-CM | POA: Diagnosis not present

## 2020-03-31 DIAGNOSIS — M2550 Pain in unspecified joint: Secondary | ICD-10-CM

## 2020-03-31 DIAGNOSIS — A419 Sepsis, unspecified organism: Secondary | ICD-10-CM

## 2020-03-31 DIAGNOSIS — R6520 Severe sepsis without septic shock: Secondary | ICD-10-CM

## 2020-03-31 LAB — CBC
HCT: 26.8 % — ABNORMAL LOW (ref 36.0–46.0)
Hemoglobin: 8.4 g/dL — ABNORMAL LOW (ref 12.0–15.0)
MCH: 25.2 pg — ABNORMAL LOW (ref 26.0–34.0)
MCHC: 31.3 g/dL (ref 30.0–36.0)
MCV: 80.5 fL (ref 80.0–100.0)
Platelets: 835 10*3/uL — ABNORMAL HIGH (ref 150–400)
RBC: 3.33 MIL/uL — ABNORMAL LOW (ref 3.87–5.11)
RDW: 18.1 % — ABNORMAL HIGH (ref 11.5–15.5)
WBC: 7.5 10*3/uL (ref 4.0–10.5)
nRBC: 0 % (ref 0.0–0.2)

## 2020-03-31 LAB — COMPREHENSIVE METABOLIC PANEL
ALT: 37 U/L (ref 0–44)
AST: 34 U/L (ref 15–41)
Albumin: 1.7 g/dL — ABNORMAL LOW (ref 3.5–5.0)
Alkaline Phosphatase: 133 U/L — ABNORMAL HIGH (ref 38–126)
Anion gap: 11 (ref 5–15)
BUN: 19 mg/dL (ref 8–23)
CO2: 24 mmol/L (ref 22–32)
Calcium: 8.5 mg/dL — ABNORMAL LOW (ref 8.9–10.3)
Chloride: 107 mmol/L (ref 98–111)
Creatinine, Ser: 1.01 mg/dL — ABNORMAL HIGH (ref 0.44–1.00)
GFR, Estimated: 59 mL/min — ABNORMAL LOW (ref >60)
Glucose, Bld: 112 mg/dL — ABNORMAL HIGH (ref 70–99)
Potassium: 2.7 mmol/L — CL (ref 3.5–5.1)
Sodium: 142 mmol/L (ref 135–145)
Total Bilirubin: 0.7 mg/dL (ref 0.3–1.2)
Total Protein: 5.7 g/dL — ABNORMAL LOW (ref 6.5–8.1)

## 2020-03-31 LAB — GLUCOSE, CAPILLARY
Glucose-Capillary: 112 mg/dL — ABNORMAL HIGH (ref 70–99)
Glucose-Capillary: 111 mg/dL — ABNORMAL HIGH (ref 70–99)
Glucose-Capillary: 110 mg/dL — ABNORMAL HIGH (ref 70–99)
Glucose-Capillary: 104 mg/dL — ABNORMAL HIGH (ref 70–99)

## 2020-03-31 IMAGING — DX DG KNEE 1-2V*R*
2 series · 2 of 2 positions shown · non-contrast
Comparison: [DATE]

CLINICAL DATA: BILATERAL knee pain and swelling, lymphedema

EXAM:
RIGHT KNEE - 1-2 VIEW

[knee ap]
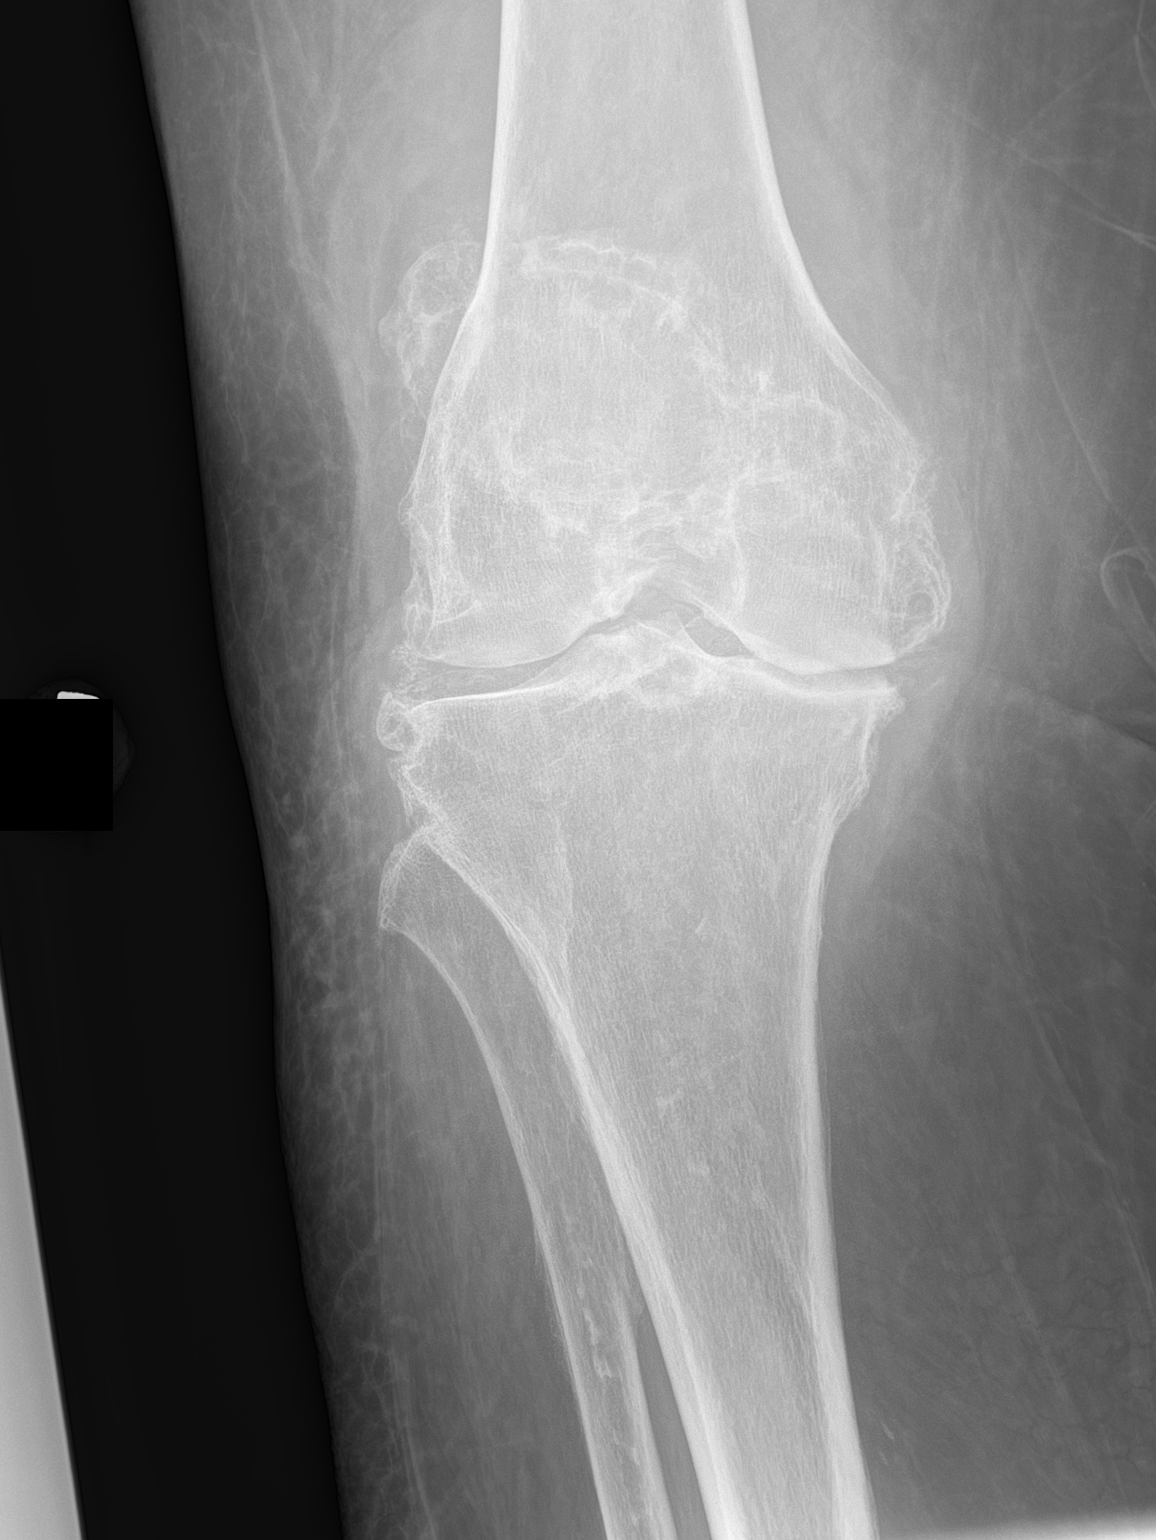

[knee lat]
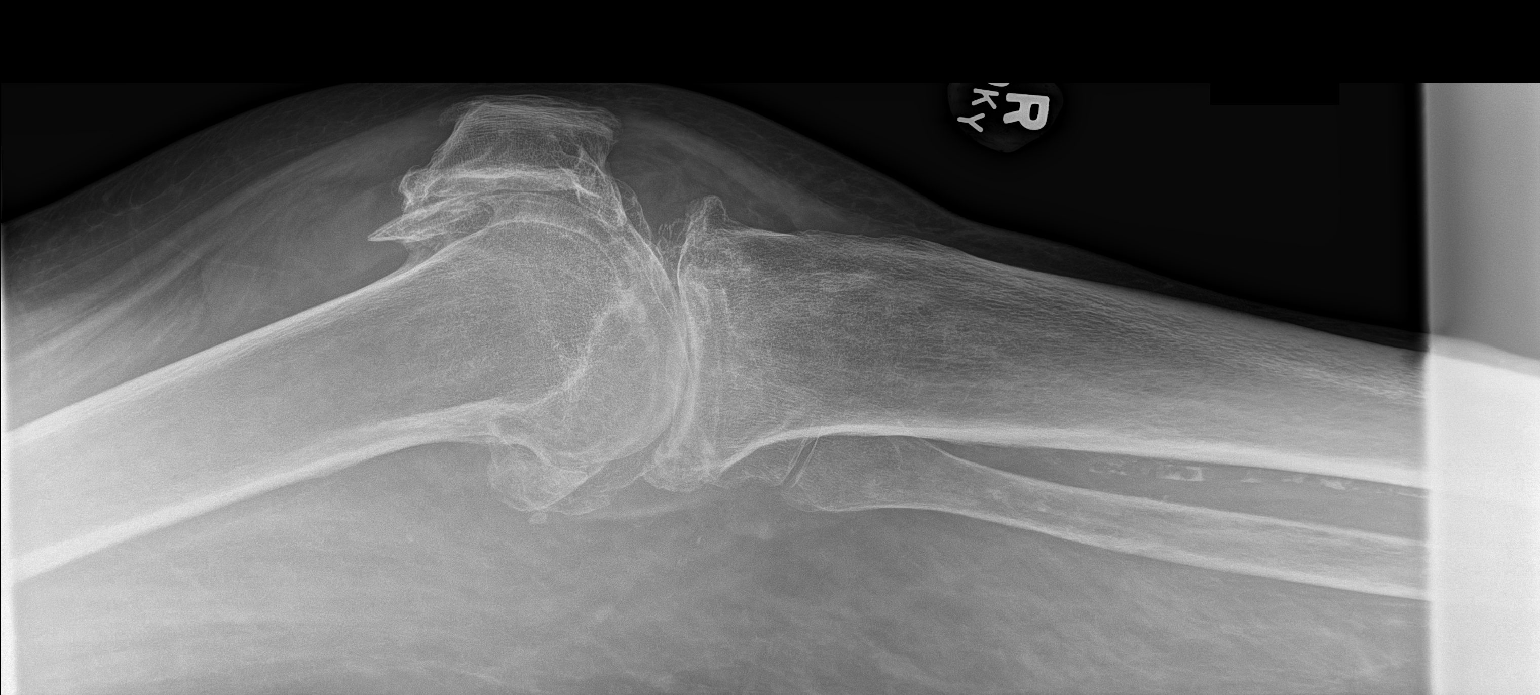

[2 of 2 positions shown; findings below may reference images not displayed]

FINDINGS: Osseous demineralization.

Tricompartmental joint space narrowing and spur formation.

Scattered chondrocalcinosis question CPPD.

Small joint effusion.

No acute fracture, dislocation, or bone destruction.

Atherosclerotic calcifications at runoff vessels.
IMPRESSION: Advanced tricompartmental osteoarthritic changes and suspect CPPD of
RIGHT knee.

Osseous demineralization.

## 2020-03-31 IMAGING — DX DG KNEE 1-2V*L*
2 series · 2 of 2 positions shown · non-contrast
Comparison: [DATE]

CLINICAL DATA: BILATERAL knee pain and swelling, lymphedema

EXAM:
LEFT KNEE - 1-2 VIEW

[knee ap]
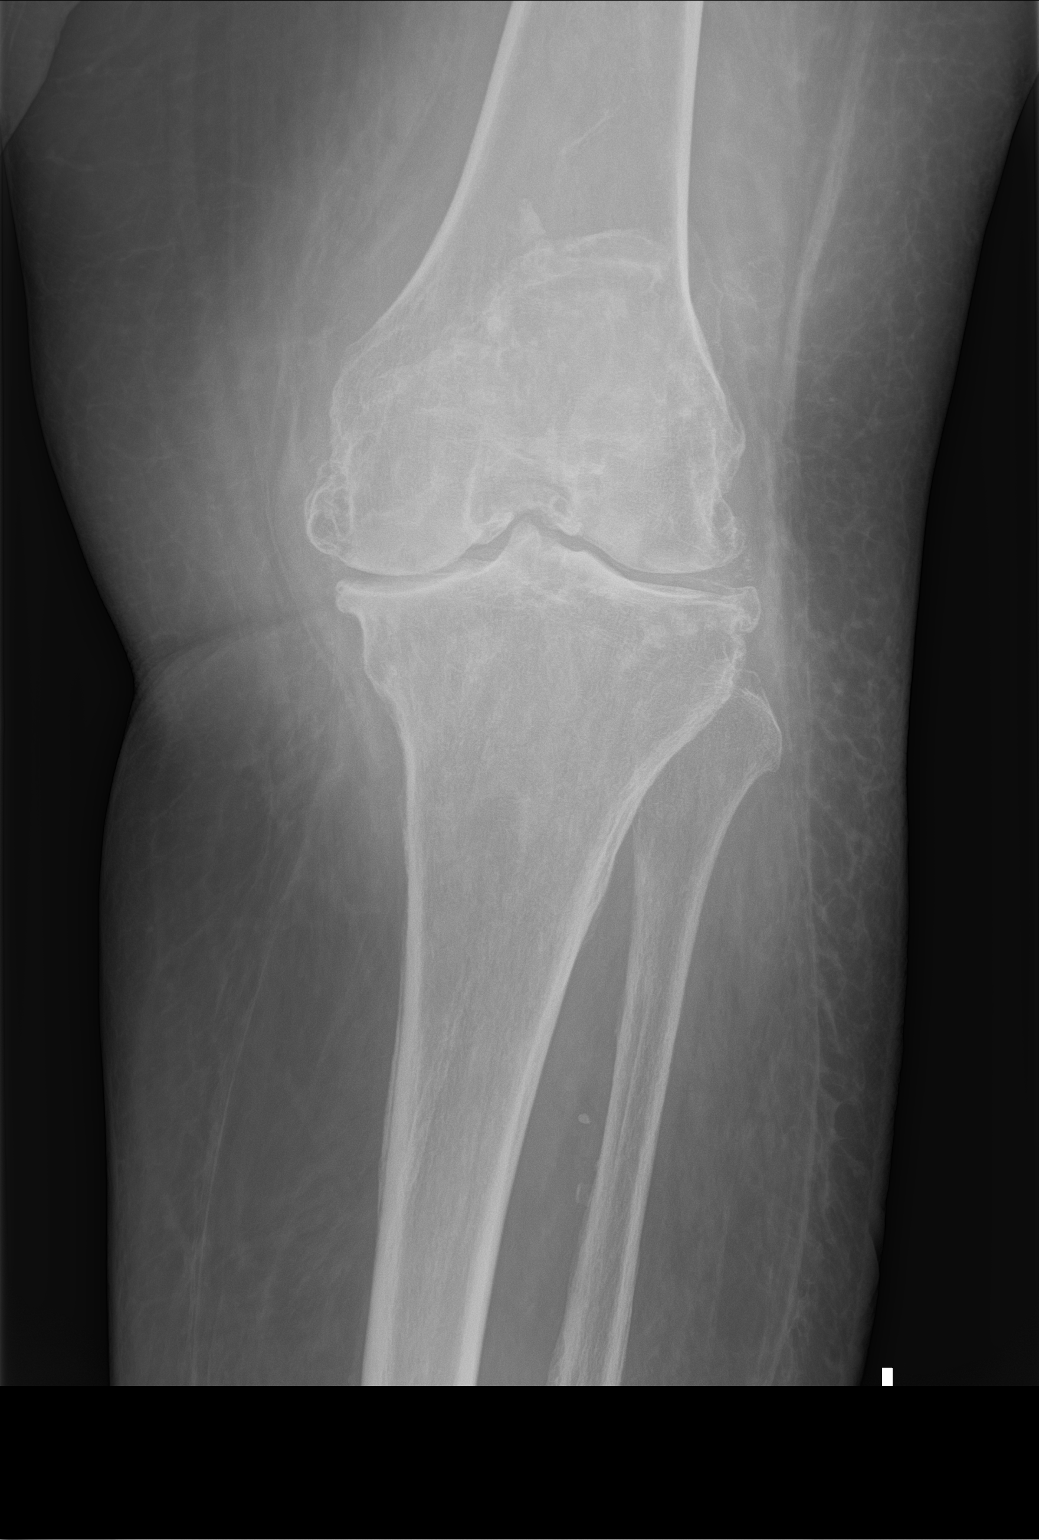

[knee lat]
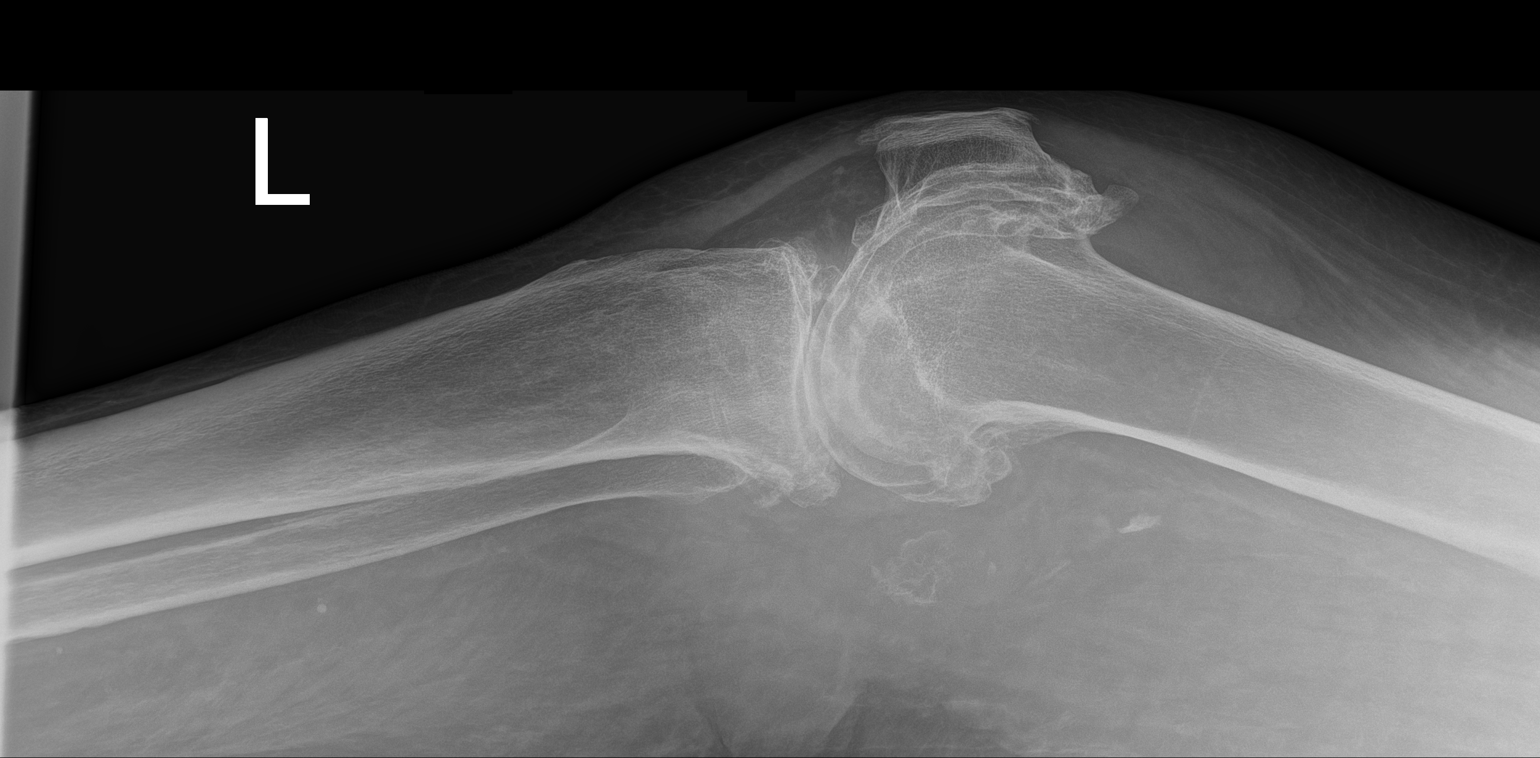

[2 of 2 positions shown; findings below may reference images not displayed]

FINDINGS: Osseous demineralization.

Tricompartmental osteoarthritic changes with joint space narrowing
and spur formation.

Minimal chondrocalcinosis question CPPD.

Small joint effusion.

No acute fracture, dislocation, or bone destruction.
IMPRESSION: Advanced tricompartmental osteoarthritic changes and suspect CPPD of
LEFT knee.

Osseous demineralization.

## 2020-03-31 MED ORDER — POTASSIUM CHLORIDE 10 MEQ/100ML IV SOLN
10.0000 meq | INTRAVENOUS | Status: AC
  Administered 2020-03-31 (×5): 10 meq via INTRAVENOUS
  Filled 2020-03-31 (×5): qty 100

## 2020-03-31 MED ORDER — COLCHICINE 0.6 MG PO TABS
0.6000 mg | ORAL_TABLET | Freq: Every day | ORAL | Status: DC
  Administered 2020-04-01 – 2020-04-08 (×8): 0.6 mg via ORAL
  Filled 2020-03-31 (×8): qty 1

## 2020-03-31 NOTE — Progress Notes (Signed)
PROGRESS NOTE  [name] [name] GYF:[id number] DOB: April 25, 1945 DOA: 03/16/2020 PCP: Hoyt Koch, MD   LOS: 14 days   Brief Narrative / Interim history: 74 year old female with history of type 2 diabetes mellitus, chronic kidney disease stage IIIa, CHF, chronic lymphedema, hypertension, osteoarthritis who was brought to the ED by EMS.  Daughter reported that she had not been able to get in touch with her mom for a couple of days so she called the police to do a safety check on her and found her laying on the floor covered with stool and bugs crawling all over her.  Per chart review, it appears that she was recently seen by her PCP for intractable nausea and vomiting for 4 to 6 weeks associated with profound weakness, was also being treated for UTI, also seen in the ED on 1 such occasion, had a CT abdomen and pelvis 11/19 which was unremarkable.  Labs notable for hyponatremia, creatinine of 3.4, mildly elevated CK, lactate 2.1, abnormal urinalysis and CT head noted age-related atrophy.  She was admitted for acute metabolic encephalopathy. Mental status is gradually improved. Concerned that dysphagia is related to mechanical issues i.e. history of Schatzki's ring dilatation in the past.  S/p EGD 12/18: Reflux esophagitis, multiple large and small nonbleeding duodenal ulcers.  Subjective / 24h Interval events: Complains of generalized joint pain, had another temp last night of one 1.4  Assessment & Plan: Principal Problem Acute metabolic encephalopathy - this was suspected to be multifactorial from acute kidney injury, dehydration, hypernatremia as well as concern for Warnicke's encephalopathy. She was initially placed on IV ceftriaxone due to concern for UTI but cultures resulted only 20 K colonies of Enterococcus.  She received 3 days of intravenous antibiotics followed by dose of fosfomycin on 12/14.  She has remained stable.  CT head, ammonia, TSH, B12, and cortisol were unremarkable.  She  underwent an MRI of the brain due to slurred speech with findings highly suggestive of Wernicke's encephalopathy.  B1 level was low.  Her mental status seems improved now, appears at baseline, continue thiamine.  Active Problems Fever -patient was supposed to be discharged to SNF on 12/21 however in the afternoon prior to discharge she developed a fever.  Source is not entirely clear, she did have a UTI with Enterococcus which has been treated with fosfomycin.  Urinalysis repeated showed rare bacteria and moderate leukocytes.  Another potential source would be her sacral ulcers although they do look quite superficial. MRI of the pelvis doesn't show any deep abscesses however raises the possibility of underlying cellulitis with local lymphadenopathy.  Started on doxycycline 12/23 and initially her fever improved but seems to be recurrent now.  I have asked ID for input -D dimer elevated, DVT US negative for DVT, VQ scan negative for PE  Chronic nausea and vomiting, dysphagia-unclear etiology, recent CT scan of the abdomen and pelvis was unremarkable, TSH and cortisol normal.  GI was consulted and underwent an EGD on 12/18 which showed LA grade C reflux esophagitis in the distal one thirds of the esophagus, no stricture noted. Normal stomach. Multiple large and small nonbleeding duodenal ulcers with pigmented material in the bulb and post bulbar area.  Placed on PPI, Carafate.  Avoid NSAIDs.  Speech evaluated and recommended dysphagia 2 diet and thin liquids> resolved  Acute kidney injury on chronic kidney disease stage IIIa -Baseline creatinine around 1.5, improved with fluids, now stable off IVF  Hypernatremia-due to dehydration, received IV fluids and sodium improved.  Continues  to improve off IV fluids suggesting adequate p.o. intake  Hypokalemia, hypomagnesemia, hypophosphatemia -continue to replete and check periodically  Anemia-likely dilutional as she received IV fluids, no bleeding other than  intermittent oozing at her sacral ulcers, overall stable. Trending down, possibly in the setting of underlying infectious process.  Status post 1 unit of packed red blood cells.  Hemoglobin stable  Thrombocytopenia-unclear etiology, her heparin has been stopped and HIT antibodies were sent and returned negative.  Platelets are stable  Left hip pain with movement-x-rays suggest moderate osteoarthritis, supportive treatment  Type 2 diabetes mellitus-resume home medications, A1c 6.8  Hypertension-controlled off antihypertensives.  See discussion above about ARB and Lasix  Chronic lymphedema-supportive local care  Sacral decubitus ulcers-secondary to moisture associated skin damage.  Concern for underlying cellulitis now on doxycycline  Scheduled Meds: . (feeding supplement) PROSource Plus  30 mL Oral TID WC  . sodium chloride   Intravenous Once  . doxycycline  100 mg Oral Q12H  . enoxaparin (LOVENOX) injection  40 mg Subcutaneous Q24H  . feeding supplement  1 Container Oral TID BM  . free water  100 mL Oral Q4H  . insulin aspart  0-5 Units Subcutaneous QHS  . insulin aspart  0-9 Units Subcutaneous TID WC  . multivitamin with minerals  1 tablet Oral Daily  . pantoprazole  40 mg Oral BID  . polyethylene glycol  17 g Oral BID  . senna  2 tablet Oral Daily  . sucralfate  1 g Oral TID WC & HS   Continuous Infusions:  PRN Meds:.acetaminophen **OR** acetaminophen, acetaminophen-codeine, HYDROmorphone (DILAUDID) injection, ondansetron  Diet Orders (From admission, onward)    Start     Ordered   03/25/20 1031  Diet Carb Modified Fluid consistency: Thin; Room service appropriate? Yes  Diet effective now       Question Answer Comment  Diet-HS Snack? Nothing   Calorie Level Medium 1600-2000   Fluid consistency: Thin   Room service appropriate? Yes      03/25/20 1030          DVT prophylaxis: enoxaparin (LOVENOX) injection 40 mg Start: 03/27/20 2000     Code Status: Full  Code  Family Communication: daughter over the phone 12/25  Status is: Inpatient  Remains inpatient appropriate because:Inpatient level of care appropriate due to severity of illness   Dispo: The patient is from: Home              Anticipated d/c is to: SNF              Anticipated d/c date is: 2 days              Patient currently is not medically stable to d/c.  Consultants:  GI ID  Procedures:  As above  Microbiology  Urine cultures - Enterococcus  Antimicrobials: Fosfomycin    Objective: Vitals:   03/30/20 1818 03/30/20 2016 03/31/20 0153 03/31/20 0425  BP: 119/72 118/72 118/73 120/72  Pulse: (!) 107 97 98 98  Resp: 16 17 17 17   Temp: 99.8 F (37.7 C) 99.7 F (37.6 C) 99.5 F (37.5 C) 99.6 F (37.6 C)  TempSrc: Oral  Oral Oral  SpO2: 96% 97% 98% 97%  Weight:      Height:        Intake/Output Summary (Last 24 hours) at 03/31/2020 0916 Last data filed at 03/31/2020 [id number] Gross per 24 hour  Intake --  Output 800 ml  Net -800 ml   [employer]  03/19/20 1100  Weight: 113.4 kg    Examination:  Constitutional: No distress Eyes: No scleral icterus ENMT moist membranes Neck: normal, supple Respiratory: Clear bilaterally, no wheezing Cardiovascular: Regular rate and rhythm, no murmurs Abdomen: Soft, nontender, nondistended, bowel sounds positive Musculoskeletal: no clubbing / cyanosis.  Skin: No significant rashes, chronic venous stasis changes bilateral lower extremities Neurologic: No focal deficits  Data Reviewed: I have independently reviewed following labs and imaging studies   CBC: Recent Labs  Lab 03/25/20 0837 03/26/20 0141 03/27/20 1253 03/28/20 0103 03/29/20 0501  WBC 8.9 8.2 7.1 6.4 6.3  HGB 9.2* 8.7* 8.0* 7.4* 8.1*  HCT 29.6* 26.7* 25.1* 23.0* 26.2*  MCV 79.8* 79.2* 78.0* 78.8* 80.6  PLT 125* 193 436* 484* [id number]*   Basic Metabolic Panel: Recent Labs  Lab 03/25/20 0027 03/25/20 0837 03/26/20 0141 03/27/20 1253  03/28/20 0103  NA  --  143 141 140 142  K  --  4.4 3.7 3.7 3.5  CL  --  109 107 106 109  CO2  --  24 24 21* 26  GLUCOSE  --  100* 118* 141* 105*  BUN  --  18 22 22 21   CREATININE  --  1.31* 1.47* 1.47* 1.44*  CALCIUM  --  8.6* 8.5* 8.3* 8.2*  MG  --   --  1.6*  --   --   PHOS 2.8  --  3.2  --   --    Liver Function Tests: Recent Labs  Lab 03/26/20 0141 03/28/20 0103  AST  --  26  ALT  --  18  ALKPHOS  --  99  BILITOT  --  0.5  PROT  --  5.2*  ALBUMIN 1.8* 1.6*   Coagulation Profile: No results for input(s): INR, PROTIME in the last 168 hours. HbA1C: No results for input(s): HGBA1C in the last 72 hours. CBG: Recent Labs  Lab 03/30/20 0824 03/30/20 1155 03/30/20 1717 03/30/20 2049 03/31/20 0831  GLUCAP 129* 93 115* 150* 112*    Recent Results (from the past 240 hour(s))  Resp Panel by RT-PCR (Flu A&B, Covid) Nasopharyngeal Swab     Status: None   Collection Time: 03/26/20 11:19 AM   Specimen: Nasopharyngeal Swab; Nasopharyngeal(NP) swabs in vial transport medium  Result Value Ref Range Status   SARS Coronavirus 2 by RT PCR NEGATIVE NEGATIVE Final    Comment: (NOTE) SARS-CoV-2 target nucleic acids are NOT DETECTED.  The SARS-CoV-2 RNA is generally detectable in upper respiratory specimens during the acute phase of infection. The lowest concentration of SARS-CoV-2 viral copies this assay can detect is 138 copies/mL. A negative result does not preclude SARS-Cov-2 infection and should not be used as the sole basis for treatment or other patient management decisions. A negative result may occur with  improper specimen collection/handling, submission of specimen other than nasopharyngeal swab, presence of viral mutation(s) within the areas targeted by this assay, and inadequate number of viral copies(<138 copies/mL). A negative result must be combined with clinical observations, patient history, and epidemiological information. The expected result is  Negative.  Fact Sheet for Patients:  EntrepreneurPulse.com.au  Fact Sheet for Healthcare Providers:  IncredibleEmployment.be  This test is no t yet approved or cleared by the [place] FDA and  has been authorized for detection and/or diagnosis of SARS-CoV-2 by FDA under an Emergency Use Authorization (EUA). This EUA will remain  in effect (meaning this test can be used) for the duration of the COVID-19 declaration under Section 564(b)(1) of the  Act, 21 U.S.C.section 360bbb-3(b)(1), unless the authorization is terminated  or revoked sooner.       Influenza A by PCR NEGATIVE NEGATIVE Final   Influenza B by PCR NEGATIVE NEGATIVE Final    Comment: (NOTE) The Xpert Xpress SARS-CoV-2/FLU/RSV plus assay is intended as an aid in the diagnosis of influenza from Nasopharyngeal swab specimens and should not be used as a sole basis for treatment. Nasal washings and aspirates are unacceptable for Xpert Xpress SARS-CoV-2/FLU/RSV testing.  Fact Sheet for Patients: EntrepreneurPulse.com.au  Fact Sheet for Healthcare Providers: IncredibleEmployment.be  This test is not yet approved or cleared by the [place] FDA and has been authorized for detection and/or diagnosis of SARS-CoV-2 by FDA under an Emergency Use Authorization (EUA). This EUA will remain in effect (meaning this test can be used) for the duration of the COVID-19 declaration under Section 564(b)(1) of the Act, 21 U.S.C. section 360bbb-3(b)(1), unless the authorization is terminated or revoked.  Performed at [facility], [place] [address] [place], [place] [address]      Radiology Studies: No results found. Marzetta Board, MD, PhD [facility]  Between 7 am - 7 pm I am available, please contact me via Amion or Securechat  Between 7 pm - 7 am I am not available, please contact night coverage MD/APP via Amion

## 2020-03-31 NOTE — Consult Note (Addendum)
[facility]    Date of Admission:  03/16/2020     Reason for Consult: fever/sepsis    Referring Provider: Cruzita Lederer   Lines: Peripheral IV's  Abx: 12/23-c doxycycline  12/14 fosfomycin 12/11-12/13; 12/21-22 ceftriaxone        Assessment: 74 y.o. female dm2, ckd, chf, htn, chronic venous stasis admitted from ALF on 12/11 after found sitting on floor in her soil unable to get up in setting of several weeks n/v/poor intake and associated weakness, septic on presentation resolved with presumed tx for ?uti but then recurrent fever  Initially on admission had fever that resolved within HD#1 and abx stopped after 3 days tx presumed uti. Her urinalysis and initial urine cx are rather not suggestive of lab UTI however. She has no other localizing sx at that time either; cxr was clear  She clinicall improves in terms of strength and intake. egd done for n/v show esophagitis only. On my exam today 12/26 eating very well. Only complaining of myalgia/polyarthralgia and exquisitely tender in knees/wrist with very limited active/passive rom R>>L knee > wrists  She then developed fever/sepsis again on 12/21 and has fevered intermittently since. No dvt's LE, PE. Right hip ct and pelvis mri stable mild pelvis LAD and right hip chronic arthrosis. Repeat cxr also clear. There is pendign bcx but agree no obvious sx of an infectious syndrome. She doesn't have any indwelling central line.   One query I have in the back of my mind is if the initial fever is related to the current fever, and if she had had fever previously (not reported from alf). The pattern of the last few days of fever is different from initial presentation but we might not know. At this time, if these turned out unrelated, nosocomial fever w/u and hx is rather unrevealing  The only thing on lab that is trending wrong is her significantly elevated platelet count  I think it is reasonable to get a  contrast ct of at least the abdomen/pelvis. Aware that her kidney just recovered. As she is not septic, could also hold abx while potentially anticipating more ID workup would be sent if ongoing fever. And per exam today, would check for polyarticular gout/pseudogout  Other consideration but less likely is drug fever, but she is not on many medications. I doubt a rheumatologic (gout could cause fever but no joint pain) or malignant process  12/12 bcx negative 12/12 ucx e faecalis 20k 12/26 bcx in progress 12/23 mr pelvis mild pelvic floor soft tissue edema and LAD 12/25 ct right hip chronic right acetabular arthrosis with mild fluid  Plan: 1. Hold doxy 2. If hemodynamic instability would add back cefepime/flagyl 3. F/u repeat blood culture 4. Please send 2 more sets if recurrent fever  5. Please see if we can do arthrocentesis on the knees or wrists and send for crystal/cell count/culture 6. Agree with abd/pelv ct with contrast, if would like to rehydrate/wait for another 24-48 hours reasonable, given recent aki just recovered 7. Send cbc with differential along with lft for the next few days 8.   Consider trial of colchicine   Principal Problem:   UTI (urinary tract infection) Active Problems:   HTN (hypertension)   DM type 2 (diabetes mellitus, type 2) (HCC)   Sepsis ([place])   Hypothermia   Acute kidney injury superimposed on chronic kidney disease (HCC)   Delirium   Protein-calorie malnutrition, severe   Duodenal ulcer  Acute esophagitis   Wernicke's aphasia   Scheduled Meds: . (feeding supplement) PROSource Plus  30 mL Oral TID WC  . sodium chloride   Intravenous Once  . doxycycline  100 mg Oral Q12H  . enoxaparin (LOVENOX) injection  40 mg Subcutaneous Q24H  . feeding supplement  1 Container Oral TID BM  . free water  100 mL Oral Q4H  . insulin aspart  0-5 Units Subcutaneous QHS  . insulin aspart  0-9 Units Subcutaneous TID WC  . multivitamin with minerals  1 tablet  Oral Daily  . pantoprazole  40 mg Oral BID  . polyethylene glycol  17 g Oral BID  . senna  2 tablet Oral Daily  . sucralfate  1 g Oral TID WC & HS   Continuous Infusions: PRN Meds:.acetaminophen **OR** acetaminophen, acetaminophen-codeine, HYDROmorphone (DILAUDID) injection, ondansetron  HPI: Colena Majid is a 74 y.o. female dm2, ckd, chf, chronic LE venous stasis, htn admitted from ALF on 12/11 after found sitting on floor in her soil unable to get up in setting of several weeks n/v/poor intake and associated weakness, septic on presentation resolved with presumed tx for ?uti but then recurrent fever  Patient has had several (4-6 weeks) n/v and poor intake, this came with progressive weakness/lightheadedness  She remembers the history well  She reports at baseline able to get around relatively well. She doesn't remember exactly what happened the day of admission but said her daughter told her she was found down and unconcsious   Hospital course: 12/11 Initial admission febrile 101; intermittent soft sbp 90s; room air Wbc 8; platelet 231; cr 3.5; lft mildly elevated 63/49/51/0.9 Urine 21-50 rbc, >50 wbc; many bacteria Empiric 3 days ceftriaxone Fever resolved within hd#1 bcx negative Clinically improved with supportive care and abx  12/16-20 platelet levels dropped nadir 80s; egd "reflux esophagitis with no bleeding, normal appearing stomach, multiple large and small nonbleeding duodenal ulcers (biopsies sent for hpylori in gastric antrum) 12/21 recurrent fever/sepsis; hds Wbc 6's; platelet normal 190; cr 1.5; lft slight elevation alk phos 130s normal bilirubin/ast/alt;  Urine 6-10 rbc, 21-50 wbc; rare bacteria Repeat covid/influenza pcr negative Started on ceftriaxone 12/22 duplex u/s negative for dvt's; abx transitioned to doxycycline for empiric cellulitis coverage of the right hip 12/23 vq scan no pe 12/23 cxr no acute pathology 12/23 pelvis mri mild fluid pelvis  floor 12/25 right hip ct no acute pathology outside of chronic right acetabular arthrosis 12/26 intermittent fever still up to 101's today wbc Cr 1.01; platelet 835; lft 34/37/133/0.7  Patient has only had peripheral iv's One marked change is the significant rise in platelet up to 800s with normal wbc and stable mildly low hb  She is only on scheduled ppi and sucralfate and insulin along with abx doxycycline and prophy lovenox as well as stool laxative  She told me the last several days has pain in her arms when she was being turned in bed. See my exam, she is tender with limited rom in knees/wrists. She said she has some joint pain before but not to this degree. She never was dx'ed with gout previously  She hasn't been out of bed yet  However she is eating well without much if at all n/v No diarrhea   Review of Systems: ROS Other ros negative  Past Medical History:  Diagnosis Date  . Acute kidney injury ([place])   . Aortic stenosis, mild 11/17/2013  . Arthritis    "both knees, spine, back, fingers" (11/29/2013)  .  CHF (congestive heart failure) ([place])   . Edema   . GERD (gastroesophageal reflux disease)   . Heart murmur   . History of diastolic dysfunction    Echo 12/7414 with diastolic dysfunction  . HTN (hypertension)   . Morbid obesity ([place])   . OA (osteoarthritis)   . PAC (premature atrial contraction)    per prior Holter  . Palpitations   . Pneumonia    "as a child"  . PVC's (premature ventricular contractions)    per prior Holter  . SVT (supraventricular tachycardia) ([place])   . Tachycardia    noted at 07/28/11 visit. Started on beta blocker. Possible atrial flutter vs long PR tachycardia/AVNRT  . Type II diabetes mellitus (HCC)     Social History   Tobacco Use  . Smoking status: Former Smoker    Packs/day: 2.00    Years: 15.00    Pack years: 30.00    Types: Cigarettes    Quit date: 11/23/1985    Years since quitting: 34.3  . Smokeless tobacco: Never Used   Vaping Use  . Vaping Use: Never used  Substance Use Topics  . Alcohol use: No  . Drug use: No    Family History  Problem Relation Age of Onset  . Alzheimer's disease Mother   . Lung cancer Mother   . COPD Father   . Diabetes Maternal Uncle   . Stroke Neg Hx   . Colon cancer Neg Hx   . Stomach cancer Neg Hx   . Esophageal cancer Neg Hx    Allergies  Allergen Reactions  . Oxycodone Hcl Nausea And Vomiting  . Penicillins Itching    Has patient had a PCN reaction causing immediate rash, facial/tongue/throat swelling, SOB or lightheadedness with hypotension: No Has patient had a PCN reaction causing severe rash involving mucus membranes or skin necrosis: No Has patient had a PCN reaction that required hospitalization: No Has patient had a PCN reaction occurring within the last 10 years: No If all of the above answers are "NO", then may proceed with Cephalosporin use.    OBJECTIVE: Blood pressure 120/72, pulse 98, temperature 99.6 F (37.6 C), temperature source Oral, resp. rate 17, height '5\' 10"'  (1.778 m), weight 113.4 kg, SpO2 97 %.  Physical Exam Well appearing female, well developed, conversant, pleasant, no distress heent normocephalic; per; eomi; conj clear Neck supple cv rrr no mrg Lungs clear; normal respiratory effort abd s/nt Ext 2+ bilateral LE edema with hypopigmented scarring prior venous stasis ulcer changes msk very tender on palpation and rom of bilateral wrists/knees. Some effusion right wrist and right knee. Very limited active/passive rom right knee >> left knee > bilateral wrists Skin no other rash Neuro cn2-12 intact; bilateral le weakness 3-4/5 (new since this admission); no rigidity Psych alert/oriented  Lab Results Lab Results  Component Value Date   WBC 7.5 03/31/2020   HGB 8.4 (L) 03/31/2020   HCT 26.8 (L) 03/31/2020   MCV 80.5 03/31/2020   PLT 835 (H) 03/31/2020    Lab Results  Component Value Date   CREATININE 1.44 (H) 03/28/2020   BUN  21 03/28/2020   NA 142 03/28/2020   K 3.5 03/28/2020   CL 109 03/28/2020   CO2 26 03/28/2020    Lab Results  Component Value Date   ALT 18 03/28/2020   AST 26 03/28/2020   ALKPHOS 99 03/28/2020   BILITOT 0.5 03/28/2020     Microbiology: Recent Results (from the past 240 hour(s))  Resp Panel  by RT-PCR (Flu A&B, Covid) Nasopharyngeal Swab     Status: None   Collection Time: 03/26/20 11:19 AM   Specimen: Nasopharyngeal Swab; Nasopharyngeal(NP) swabs in vial transport medium  Result Value Ref Range Status   SARS Coronavirus 2 by RT PCR NEGATIVE NEGATIVE Final    Comment: (NOTE) SARS-CoV-2 target nucleic acids are NOT DETECTED.  The SARS-CoV-2 RNA is generally detectable in upper respiratory specimens during the acute phase of infection. The lowest concentration of SARS-CoV-2 viral copies this assay can detect is 138 copies/mL. A negative result does not preclude SARS-Cov-2 infection and should not be used as the sole basis for treatment or other patient management decisions. A negative result may occur with  improper specimen collection/handling, submission of specimen other than nasopharyngeal swab, presence of viral mutation(s) within the areas targeted by this assay, and inadequate number of viral copies(<138 copies/mL). A negative result must be combined with clinical observations, patient history, and epidemiological information. The expected result is Negative.  Fact Sheet for Patients:  EntrepreneurPulse.com.au  Fact Sheet for Healthcare Providers:  IncredibleEmployment.be  This test is no t yet approved or cleared by the [place] FDA and  has been authorized for detection and/or diagnosis of SARS-CoV-2 by FDA under an Emergency Use Authorization (EUA). This EUA will remain  in effect (meaning this test can be used) for the duration of the COVID-19 declaration under Section 564(b)(1) of the Act, 21 U.S.C.section  360bbb-3(b)(1), unless the authorization is terminated  or revoked sooner.       Influenza A by PCR NEGATIVE NEGATIVE Final   Influenza B by PCR NEGATIVE NEGATIVE Final    Comment: (NOTE) The Xpert Xpress SARS-CoV-2/FLU/RSV plus assay is intended as an aid in the diagnosis of influenza from Nasopharyngeal swab specimens and should not be used as a sole basis for treatment. Nasal washings and aspirates are unacceptable for Xpert Xpress SARS-CoV-2/FLU/RSV testing.  Fact Sheet for Patients: EntrepreneurPulse.com.au  Fact Sheet for Healthcare Providers: IncredibleEmployment.be  This test is not yet approved or cleared by the [place] FDA and has been authorized for detection and/or diagnosis of SARS-CoV-2 by FDA under an Emergency Use Authorization (EUA). This EUA will remain in effect (meaning this test can be used) for the duration of the COVID-19 declaration under Section 564(b)(1) of the Act, 21 U.S.C. section 360bbb-3(b)(1), unless the authorization is terminated or revoked.  Performed at [facility], [place] [address], [place], [place] [address]    Imaging: 12/25 ct right hip I personally reviewed; no obvious abscess 1. No acute osseous abnormality of the right hip. 2. Mild nonspecific induration within the subcutaneous fat. No soft tissue fluid collection or abscess 3. Moderate osteoarthritis of the right hip with small right hip joint effusion. Effusion was present on multiple previous studies including previous CT from 01/06/2019 and is likely reactive secondary to osteoarthritis. However, if there is a high clinical suspicion for septic arthritis, arthrocentesis should be considered. 4. Multiple mildly enlarged bilateral inguinal lymph nodes are similar to previous studies. 5. Moderate volume of stool within the rectosigmoid colon.   12/23 mr pelvis 1. No evidence for perirectal fistula or abscess. Assessment  limited by lack of intravenous contrast material. 2. Bartholin's cyst noted at the vaginal introitus. 3. Soft tissue edema noted in the pelvic floor, adductor musculature bilaterally, and in the soft tissues of the perineum. These changes are associated with mild bilateral groin lymphadenopathy. Soft tissue infection not excluded. Follow-up recommended to ensure resolution of lymphadenopathy.  12/23 cxr I personally reviewed no obvious pathology outside of mild right basilar opacity  12/23 vq scan and duplex Ext no dvt's  02/23/2020 abd pelv ct no contrast 1. Colonic diverticulosis without evidence of acute diverticulitis. 2. Bilateral renal cysts.  Jabier Mutton, [facility] [place] [phone] pager    03/31/2020, 10:44 AM

## 2020-04-01 DIAGNOSIS — R410 Disorientation, unspecified: Secondary | ICD-10-CM | POA: Diagnosis not present

## 2020-04-01 DIAGNOSIS — M11262 Other chondrocalcinosis, left knee: Secondary | ICD-10-CM

## 2020-04-01 DIAGNOSIS — R509 Fever, unspecified: Secondary | ICD-10-CM | POA: Diagnosis not present

## 2020-04-01 DIAGNOSIS — N3001 Acute cystitis with hematuria: Secondary | ICD-10-CM | POA: Diagnosis not present

## 2020-04-01 DIAGNOSIS — M170 Bilateral primary osteoarthritis of knee: Secondary | ICD-10-CM | POA: Diagnosis not present

## 2020-04-01 DIAGNOSIS — M11261 Other chondrocalcinosis, right knee: Secondary | ICD-10-CM

## 2020-04-01 DIAGNOSIS — N179 Acute kidney failure, unspecified: Secondary | ICD-10-CM | POA: Diagnosis not present

## 2020-04-01 LAB — COMPREHENSIVE METABOLIC PANEL
ALT: 33 U/L (ref 0–44)
AST: 34 U/L (ref 15–41)
Albumin: 1.7 g/dL — ABNORMAL LOW (ref 3.5–5.0)
Alkaline Phosphatase: 126 U/L (ref 38–126)
Anion gap: 14 (ref 5–15)
BUN: 17 mg/dL (ref 8–23)
CO2: 21 mmol/L — ABNORMAL LOW (ref 22–32)
Calcium: 8.6 mg/dL — ABNORMAL LOW (ref 8.9–10.3)
Chloride: 105 mmol/L (ref 98–111)
Creatinine, Ser: 1.05 mg/dL — ABNORMAL HIGH (ref 0.44–1.00)
GFR, Estimated: 56 mL/min — ABNORMAL LOW (ref >60)
Glucose, Bld: 110 mg/dL — ABNORMAL HIGH (ref 70–99)
Potassium: 3.4 mmol/L — ABNORMAL LOW (ref 3.5–5.1)
Sodium: 140 mmol/L (ref 135–145)
Total Bilirubin: 0.8 mg/dL (ref 0.3–1.2)
Total Protein: 5.6 g/dL — ABNORMAL LOW (ref 6.5–8.1)

## 2020-04-01 LAB — MAGNESIUM: Magnesium: 1.6 mg/dL — ABNORMAL LOW (ref 1.7–2.4)

## 2020-04-01 LAB — CBC
HCT: 27.2 % — ABNORMAL LOW (ref 36.0–46.0)
Hemoglobin: 8.3 g/dL — ABNORMAL LOW (ref 12.0–15.0)
MCH: 25 pg — ABNORMAL LOW (ref 26.0–34.0)
MCHC: 30.5 g/dL (ref 30.0–36.0)
MCV: 81.9 fL (ref 80.0–100.0)
Platelets: 646 10*3/uL — ABNORMAL HIGH (ref 150–400)
RBC: 3.32 MIL/uL — ABNORMAL LOW (ref 3.87–5.11)
RDW: 18.3 % — ABNORMAL HIGH (ref 11.5–15.5)
WBC: 6.7 10*3/uL (ref 4.0–10.5)
nRBC: 0 % (ref 0.0–0.2)

## 2020-04-01 LAB — GLUCOSE, CAPILLARY
Glucose-Capillary: 107 mg/dL — ABNORMAL HIGH (ref 70–99)
Glucose-Capillary: 167 mg/dL — ABNORMAL HIGH (ref 70–99)
Glucose-Capillary: 103 mg/dL — ABNORMAL HIGH (ref 70–99)
Glucose-Capillary: 118 mg/dL — ABNORMAL HIGH (ref 70–99)

## 2020-04-01 LAB — PHOSPHORUS: Phosphorus: 2.6 mg/dL (ref 2.5–4.6)

## 2020-04-01 MED ORDER — THIAMINE HCL 100 MG PO TABS
100.0000 mg | ORAL_TABLET | Freq: Every day | ORAL | Status: DC
  Administered 2020-04-01 – 2020-04-08 (×8): 100 mg via ORAL
  Filled 2020-04-01 (×9): qty 1

## 2020-04-01 MED ORDER — POTASSIUM CHLORIDE CRYS ER 20 MEQ PO TBCR
20.0000 meq | EXTENDED_RELEASE_TABLET | Freq: Once | ORAL | Status: AC
  Administered 2020-04-01: 20 meq via ORAL
  Filled 2020-04-01: qty 1

## 2020-04-01 MED ORDER — MAGNESIUM SULFATE 2 GM/50ML IV SOLN
2.0000 g | Freq: Once | INTRAVENOUS | Status: AC
  Administered 2020-04-01: 2 g via INTRAVENOUS
  Filled 2020-04-01: qty 50

## 2020-04-01 NOTE — Progress Notes (Signed)
[facility]  PROGRESS NOTE  [name] [name] [id number] DOB: [birth year] DOA: 03/16/2020 PCP: Hoyt Koch, MD   Brief HPI:   74 year old female with a history of diabetes mellitus type 2, chronic kidney disease stage IIIa, CHF, chronic lymphedema, hypertension, osteoarthritis was brought to ED by EMS.  Daughter reported that she has not been able to get in touch with her mom for past couple of days so she called the police due to safety check on her and found her lying on the floor covered with stool and bugs crawling all over her.  She was seen by her PCP for intractable nausea vomiting for 4 to 6 weeks, associated with profound weakness and was also treated for UTI.  She was admitted with acute metabolic encephalopathy CT head showed no acute abnormality.  Her mental status had gradually improved.  She underwent EGD on 03/23/2020 which showed reflux esophagitis, multiple large and small nonbleeding duodenal ulcers.    Subjective   Patient seen and examined, continues to be pleasantly confused   Assessment/Plan:     1. Acute metabolic encephalopathy-multifactorial from acute kidney injury, dehydration, hyponatremia also concern for Warnicke's encephalopathy.  She was initially placed on IV ceftriaxone due to concern for UTI but cultures only grew 20,000 colonies of Enterococcus.  She received 3 days of intravenous antibiotics followed by a dose of fosfomycin on 03/19/2020.  CT head, ammonia, TSH, B12, cortisol were unremarkable.  MRI brain was done which showed findings suggestive of Wernicke's encephalopathy.  B1 level was low.  Currently patient is on thiamine supplementation 100 mg po daily.  She did receive high-dose IV thiamine for 10 days in the hospital. 2. Fever-patient was supposed to discharge to skilled nursing facility on 03/26/2020 however she developed fever.  She did have a UTI with Enterococcus which was treated with fosfomycin.  Urinalysis showed rare bacteria  and moderate leukocytes.  Another potential source could be from sacral ulcers though they look quite superficial.  MRI pelvis did not show deep abscess however raised the possibility of underlying cellulitis with local lymphadenopathy.  She was started on doxycycline 03/28/2020 initially had fever improved and ID was consulted.  She was found to have swelling and warmth of right elbow and right knee, x-ray of the joint shows polyarticular pseudogout.  Patient started on colchicine.   3. Polyarticular pseudogout-confirmed on x-ray of right elbow and knee.  Started on anti-inflammatory medication, colchicine 0.6 mg daily, if no improvement consider arthrocentesis to send for crystal analysis, cell count/culture.  ID following. 4. Chronic nausea and vomiting/dysphagia-unclear etiology, GI was consulted, patient underwent EGD on 03/23/2020 which showed grade C reflux esophagitis in the distal one third of the esophagus, no stricture noted.  Also showed multiple large and small nonbleeding duodenal ulcers with pigmented material in the bulb and postbulbar area.  Placed on PPI, Carafate.  Avoid NSAIDs.  Speech therapy saw the patient and recommended dysphagia 2 diet with thin liquids. 5. Acute kidney injury on CKD stage IIIa-baseline creatinine around 1.5, improved with IV fluids. 6. Hypernatremia-secondary to dehydration, received IV fluids and sodium.  Improved. 7. Hypokalemia, hypomagnesemia, hypophosphatemia-today potassium is 3.4, will replace potassium and follow BMP in am.  Magnesium is 1.6, will replace magnesium. 8. Anemia-hemoglobin stable at 8.3, likely dilutional. 9. Thrombocytosis-likely in setting of inflammation/infection.  Platelet count is 646 today.     COVID-19 Labs  No results for input(s): DDIMER, FERRITIN, LDH, CRP in the last 72 hours.  Lab Results  Component Value Date  [place] NEGATIVE 03/26/2020   [place] NEGATIVE 03/16/2020   [place] NEGATIVE 01/06/2019      Scheduled medications:   . (feeding supplement) PROSource Plus  30 mL Oral TID WC  . sodium chloride   Intravenous Once  . colchicine  0.6 mg Oral Daily  . enoxaparin (LOVENOX) injection  40 mg Subcutaneous Q24H  . feeding supplement  1 Container Oral TID BM  . free water  100 mL Oral Q4H  . insulin aspart  0-5 Units Subcutaneous QHS  . insulin aspart  0-9 Units Subcutaneous TID WC  . multivitamin with minerals  1 tablet Oral Daily  . pantoprazole  40 mg Oral BID  . polyethylene glycol  17 g Oral BID  . senna  2 tablet Oral Daily  . sucralfate  1 g Oral TID WC & HS         CBG: Recent Labs  Lab 03/31/20 1302 03/31/20 1818 03/31/20 2215 04/01/20 0757 04/01/20 1126  GLUCAP 111* 110* 104* 107* 167*    SpO2: 97 %    CBC: Recent Labs  Lab 03/27/20 1253 03/28/20 0103 03/29/20 0501 03/31/20 1001 04/01/20 0644  WBC 7.1 6.4 6.3 7.5 6.7  HGB 8.0* 7.4* 8.1* 8.4* 8.3*  HCT 25.1* 23.0* 26.2* 26.8* 27.2*  MCV 78.0* 78.8* 80.6 80.5 81.9  PLT 436* 484* 655* 835* 646*    Basic Metabolic Panel: Recent Labs  Lab 03/26/20 0141 03/27/20 1253 03/28/20 0103 03/31/20 1001 04/01/20 0644  NA 141 140 142 142 140  K 3.7 3.7 3.5 2.7* 3.4*  CL 107 106 109 107 105  CO2 24 21* 26 24 21*  GLUCOSE 118* 141* 105* 112* 110*  BUN 22 22 21 19 17   CREATININE 1.47* 1.47* 1.44* 1.01* 1.05*  CALCIUM 8.5* 8.3* 8.2* 8.5* 8.6*  MG 1.6*  --   --   --  1.6*  PHOS 3.2  --   --   --  2.6     Liver Function Tests: Recent Labs  Lab 03/26/20 0141 03/28/20 0103 03/31/20 1001 04/01/20 0644  AST  --  26 34 34  ALT  --  18 37 33  ALKPHOS  --  99 133* 126  BILITOT  --  0.5 0.7 0.8  PROT  --  5.2* 5.7* 5.6*  ALBUMIN 1.8* 1.6* 1.7* 1.7*     Antibiotics: Anti-infectives (From admission, onward)   Start     Dose/Rate Route Frequency Ordered Stop   03/28/20 1200  doxycycline (VIBRA-TABS) tablet 100 mg  Status:  Discontinued        100 mg Oral Every 12 hours 03/28/20 1101 04/01/20  1150   03/26/20 1500  cefTRIAXone (ROCEPHIN) 1 g in sodium chloride 0.9 % 100 mL IVPB  Status:  Discontinued        1 g 200 mL/hr over 30 Minutes Intravenous Every 24 hours 03/26/20 1410 03/27/20 1457   03/19/20 1300  fosfomycin (MONUROL) packet 3 g        3 g Oral  Once 03/19/20 1159 03/19/20 1415   03/18/20 0600  cefTRIAXone (ROCEPHIN) 1 g in sodium chloride 0.9 % 100 mL IVPB  Status:  Discontinued        1 g 200 mL/hr over 30 Minutes Intravenous Every 24 hours 03/17/20 0519 03/19/20 1159   03/17/20 0415  cefTRIAXone (ROCEPHIN) 1 g in sodium chloride 0.9 % 100 mL IVPB        1 g 200 mL/hr over 30 Minutes Intravenous  Once 03/17/20 0406  03/17/20 0448       DVT prophylaxis: Lovenox  Code Status: Full code  Family Communication: No family at bedside   Consultants:    Procedures:      Objective   Vitals:   03/31/20 0425 03/31/20 2222 04/01/20 1513 04/01/20 1530  BP: 120/72 100/83 (!) 92/45 90/69  Pulse: 98 100 (!) 110 (!) 110  Resp: 17 18 18 18   Temp: 99.6 F (37.6 C) 98.5 F (36.9 C) 98.2 F (36.8 C) 98.2 F (36.8 C)  TempSrc: Oral  Oral Oral  SpO2: 97% 99% 97%   Weight:      Height:        Intake/Output Summary (Last 24 hours) at 04/01/2020 1638 Last data filed at 04/01/2020 [id number] Gross per 24 hour  Intake 100 ml  Output 400 ml  Net -300 ml    12/25 1901 - 12/27 0700 In: 100 [P.O.:100] Out: 1200 [Urine:1200]  Filed Weights   03/19/20 1100  Weight: 113.4 kg    Physical Examination:   General-appears in no acute distress Heart-S1-S2, regular, no murmur auscultated Lungs-clear to auscultation bilaterally, no wheezing or crackles auscultated Abdomen-soft, nontender, no organomegaly Extremities-no edema in the lower extremities Neuro-alert, oriented to self only  Status is: Inpatient  Dispo: The patient is from: Home              Anticipated d/c is to: Skilled nursing facility              Anticipated d/c date is: 04/03/2020               Patient currently not stable for discharge  Barrier to discharge- ongoing work up for fever       Data Reviewed:   Recent Results (from the past 240 hour(s))  Resp Panel by RT-PCR (Flu A&B, Covid) Nasopharyngeal Swab     Status: None   Collection Time: 03/26/20 11:19 AM   Specimen: Nasopharyngeal Swab; Nasopharyngeal(NP) swabs in vial transport medium  Result Value Ref Range Status   SARS Coronavirus 2 by RT PCR NEGATIVE NEGATIVE Final    Comment: (NOTE) SARS-CoV-2 target nucleic acids are NOT DETECTED.  The SARS-CoV-2 RNA is generally detectable in upper respiratory specimens during the acute phase of infection. The lowest concentration of SARS-CoV-2 viral copies this assay can detect is 138 copies/mL. A negative result does not preclude SARS-Cov-2 infection and should not be used as the sole basis for treatment or other patient management decisions. A negative result may occur with  improper specimen collection/handling, submission of specimen other than nasopharyngeal swab, presence of viral mutation(s) within the areas targeted by this assay, and inadequate number of viral copies(<138 copies/mL). A negative result must be combined with clinical observations, patient history, and epidemiological information. The expected result is Negative.  Fact Sheet for Patients:  EntrepreneurPulse.com.au  Fact Sheet for Healthcare Providers:  IncredibleEmployment.be  This test is no t yet approved or cleared by the [place] FDA and  has been authorized for detection and/or diagnosis of SARS-CoV-2 by FDA under an Emergency Use Authorization (EUA). This EUA will remain  in effect (meaning this test can be used) for the duration of the COVID-19 declaration under Section 564(b)(1) of the Act, 21 U.S.C.section 360bbb-3(b)(1), unless the authorization is terminated  or revoked sooner.       Influenza A by PCR NEGATIVE NEGATIVE Final   Influenza  B by PCR NEGATIVE NEGATIVE Final    Comment: (NOTE) The Xpert Xpress SARS-CoV-2/FLU/RSV plus assay is  intended as an aid in the diagnosis of influenza from Nasopharyngeal swab specimens and should not be used as a sole basis for treatment. Nasal washings and aspirates are unacceptable for Xpert Xpress SARS-CoV-2/FLU/RSV testing.  Fact Sheet for Patients: BloggerCourse.com  Fact Sheet for Healthcare Providers: SeriousBroker.it  This test is not yet approved or cleared by the [place] FDA and has been authorized for detection and/or diagnosis of SARS-CoV-2 by FDA under an Emergency Use Authorization (EUA). This EUA will remain in effect (meaning this test can be used) for the duration of the COVID-19 declaration under Section 564(b)(1) of the Act, 21 U.S.C. section 360bbb-3(b)(1), unless the authorization is terminated or revoked.  Performed at [facility], [address], [place], [place] [address]   Culture, blood (Routine X 2) w Reflex to ID Panel     Status: None (Preliminary result)   Collection Time: 03/31/20  6:22 AM   Specimen: BLOOD RIGHT FOREARM  Result Value Ref Range Status   Specimen Description BLOOD RIGHT FOREARM  Final   Special Requests   Final    BOTTLES DRAWN AEROBIC ONLY Blood Culture adequate volume   Culture   Final    NO GROWTH < 24 HOURS Performed at [facility], [address] [place], [place] [address]    Report Status PENDING  Incomplete  Culture, blood (Routine X 2) w Reflex to ID Panel     Status: None (Preliminary result)   Collection Time: 03/31/20  6:22 AM   Specimen: BLOOD RIGHT WRIST  Result Value Ref Range Status   Specimen Description BLOOD RIGHT WRIST  Final   Special Requests   Final    BOTTLES DRAWN AEROBIC ONLY Blood Culture results may not be optimal due to an inadequate volume of blood received in culture bottles   Culture   Final    NO GROWTH < 24 HOURS Performed  at [facility], [address], [place], [place] [address]    Report Status PENDING  Incomplete    No results for input(s): LIPASE, AMYLASE in the last 168 hours. No results for input(s): AMMONIA in the last 168 hours.  Cardiac Enzymes: No results for input(s): CKTOTAL, CKMB, CKMBINDEX, TROPONINI in the last 168 hours. BNP (last 3 results) No results for input(s): BNP in the last 8760 hours.  ProBNP (last 3 results) No results for input(s): PROBNP in the last 8760 hours.  Studies:  DG Knee 1-2 Views Left  Result Date: 03/31/2020 CLINICAL DATA:  BILATERAL knee pain and swelling, lymphedema EXAM: LEFT KNEE - 1-2 VIEW COMPARISON:  02/14/2018 FINDINGS: Osseous demineralization. Tricompartmental osteoarthritic changes with joint space narrowing and spur formation. Minimal chondrocalcinosis question CPPD. Small joint effusion. No acute fracture, dislocation, or bone destruction. IMPRESSION: Advanced tricompartmental osteoarthritic changes and suspect CPPD of LEFT knee. Osseous demineralization. Electronically Signed   By: Ulyses Southward M.D.   On: 03/31/2020 15:34   DG Knee 1-2 Views Right  Result Date: 03/31/2020 CLINICAL DATA:  BILATERAL knee pain and swelling, lymphedema EXAM: RIGHT KNEE - 1-2 VIEW COMPARISON:  02/14/2018 FINDINGS: Osseous demineralization. Tricompartmental joint space narrowing and spur formation. Scattered chondrocalcinosis question CPPD. Small joint effusion. No acute fracture, dislocation, or bone destruction. Atherosclerotic calcifications at runoff vessels. IMPRESSION: Advanced tricompartmental osteoarthritic changes and suspect CPPD of RIGHT knee. Osseous demineralization. Electronically Signed   By: Ulyses Southward M.D.   On: 03/31/2020 15:33   CT HIP RIGHT WO CONTRAST  Result Date: 03/31/2020 CLINICAL DATA:  Right hip pain, fever EXAM: CT OF THE RIGHT HIP WITHOUT CONTRAST TECHNIQUE: Multidetector CT imaging of the right hip was performed according to the standard  protocol. Multiplanar CT image reconstructions were also generated. COMPARISON:  MRI 03/28/2020, CT 02/23/2020, 01/06/2019 FINDINGS: Bones/Joint/Cartilage No acute fracture. No dislocation. Moderate osteoarthritis of the right hip as manifested by joint space narrowing, subchondral sclerosis/cystic change, and marginal osteophyte formation. Small right hip joint effusion, nonspecific (series 7, image 41). No evidence of femoral head avascular necrosis. Degenerative changes of the pubic symphysis with chondrocalcinosis. No lytic or sclerotic bone lesion identified. Ligaments Suboptimally assessed by CT. Muscles and Tendons No acute musculotendinous abnormality by CT. No intramuscular fluid collection. Soft tissues Mild induration within the subcutaneous fat of the infra gluteal soft tissues. No soft tissue fluid collection or abscess. Multiple mildly enlarged right inguinal lymph nodes, largest measuring up to 1.7 cm short axis (series 7, image 86). There are partially visualized mildly enlarged left inguinal lymph nodes. Inguinal nodes are similar in size to previous CT. Scattered areas of nodularity within the subcutaneous tissues of the lower anterior abdominal wall likely reflecting injection granulomas. Diverticular change within the visualized sigmoid colon. Moderate volume of stool within the rectosigmoid colon. No acute findings within the visualized portion of the pelvis. IMPRESSION: 1. No acute osseous abnormality of the right hip. 2. Mild nonspecific induration within the subcutaneous fat. No soft tissue fluid collection or abscess 3. Moderate osteoarthritis of the right hip with small right hip joint effusion. Effusion was present on multiple previous studies including previous CT from 01/06/2019 and is likely reactive secondary to osteoarthritis. However, if there is a high clinical suspicion for septic arthritis, arthrocentesis should be considered. 4. Multiple mildly enlarged bilateral inguinal lymph  nodes are similar to previous studies. 5. Moderate volume of stool within the rectosigmoid colon. Electronically Signed   By: Davina Poke D.O.   On: 03/31/2020 09:13       [place]   [facility] If 7PM-7AM, please contact night-coverage at [email or web address], Office  [phone]   04/01/2020, 4:38 PM  LOS: 15 days

## 2020-04-01 NOTE — Progress Notes (Signed)
[facility]  Date of Admission:  03/16/2020       Abx: 12/23-c doxycycline  12/14 fosfomycin 12/11-12/13; 12/21-22 ceftriaxone                                                       Assessment: 74 y.o. female dm2, ckd, chf, htn, chronic venous stasis admitted from ALF on 12/11 after found sitting on floor in her soil unable to get up in setting of several weeks n/v/poor intake and associated weakness, septic on presentation resolved with presumed tx for ?uti but then recurrent fever  Initially on admission had fever that resolved within HD#1 and abx stopped after 3 days tx presumed uti. Her urinalysis and initial urine cx are rather not suggestive of lab UTI however. She has no other localizing sx at that time either; cxr was clear  She clinicall improves in terms of strength and intake. egd done for n/v show esophagitis only. On my exam today 12/26 eating very well. Only complaining of myalgia/polyarthralgia and exquisitely tender in knees/wrist with very limited active/passive rom R>>L knee > wrists  She then developed fever/sepsis again on 12/21 and has fevered intermittently since. No dvt's LE, PE. Right hip ct and pelvis mri stable mild pelvis LAD and right hip chronic arthrosis. Repeat cxr also clear. There is pendign bcx but agree no obvious sx of an infectious syndrome. She doesn't have any indwelling central line.  Exam so far severe tenderness of knees/wrists. Xray knees suggestive of chondrocalcinosis   12/12 bcx negative 12/12 ucx e faecalis 20k 12/26 bcx in progress 12/23 mr pelvis mild pelvic floor soft tissue edema and LAD 12/25 ct right hip chronic right acetabular arthrosis with mild fluid 12/26 xray knees chondrocalcinosis  12/27 assessment Suspect polyarticular pseudogout. chrondrocalcinosis changes might not reflect crystal arthropathy which would require crystal analysis on synovial fluid. We could see how she does  with supportive care/antiinflammatory. Low suspicion for infectious syndrome at this time. Platelet level has improved after colchicine started   Plan: 1. Stop doxy 2. Would still consider arthrocentesis if feasible and send for crystal analysis, cell count/culture 3.   As long as she is improving with supportive care/antiinflammatory, could defer further w/u otherwise    Principal Problem:   UTI (urinary tract infection) Active Problems:   HTN (hypertension)   DM type 2 (diabetes mellitus, type 2) ([facility])   Severe sepsis without septic shock (HCC)   Hypothermia   Acute kidney injury superimposed on chronic kidney disease (HCC)   Delirium   Protein-calorie malnutrition, severe   Duodenal ulcer   Acute esophagitis   Wernicke's aphasia   Polyarthralgia   Scheduled Meds: . (feeding supplement) PROSource Plus  30 mL Oral TID WC  . sodium chloride   Intravenous Once  . colchicine  0.6 mg Oral Daily  . enoxaparin (LOVENOX) injection  40 mg Subcutaneous Q24H  . feeding supplement  1 Container Oral TID BM  . free water  100 mL Oral Q4H  . insulin aspart  0-5 Units Subcutaneous QHS  . insulin aspart  0-9 Units Subcutaneous TID WC  . multivitamin with minerals  1 tablet Oral Daily  . pantoprazole  40 mg Oral BID  . polyethylene glycol  17 g Oral BID  . senna  2 tablet Oral Daily  . sucralfate  1 g Oral TID WC & HS   Continuous Infusions: PRN Meds:.acetaminophen **OR** acetaminophen, acetaminophen-codeine, HYDROmorphone (DILAUDID) injection, ondansetron   SUBJECTIVE: Colchicine started yesterday Platelet down trending Afebrile No n/v/diarrhea with cholchicine Xray knees showed chrondrocalcinosis   Review of Systems: ROS Other ros negative  Allergies  Allergen Reactions  . Oxycodone Hcl Nausea And Vomiting  . Penicillins Itching    Has patient had a PCN reaction causing immediate rash, facial/tongue/throat swelling, SOB or lightheadedness with hypotension: No Has  patient had a PCN reaction causing severe rash involving mucus membranes or skin necrosis: No Has patient had a PCN reaction that required hospitalization: No Has patient had a PCN reaction occurring within the last 10 years: No If all of the above answers are "NO", then may proceed with Cephalosporin use.    OBJECTIVE: Vitals:   03/30/20 2016 03/31/20 0153 03/31/20 0425 03/31/20 2222  BP: 118/72 118/73 120/72 100/83  Pulse: 97 98 98 100  Resp: 17 17 17 18   Temp: 99.7 F (37.6 C) 99.5 F (37.5 C) 99.6 F (37.6 C) 98.5 F (36.9 C)  TempSrc:  Oral Oral   SpO2: 97% 98% 97% 99%  Weight:      Height:       Body mass index is 35.87 kg/m.  Physical Exam No distress, lying in bed, conversant Normocephalic; per; conj clear Neck supple cv rrr no mrg Lungs normal respiratory effort; clear abd s/nt Ext chronic bilateral LE edema with atrophic hypopigmentation  msk tender bilateral wrists and knee still on palpation and active/passive rom Neuro generalized weakness most severe proximal LE 4/5 Psych alert/oriented  Lab Results Lab Results  Component Value Date   WBC 6.7 04/01/2020   HGB 8.3 (L) 04/01/2020   HCT 27.2 (L) 04/01/2020   MCV 81.9 04/01/2020   PLT 646 (H) 04/01/2020    Lab Results  Component Value Date   CREATININE 1.05 (H) 04/01/2020   BUN 17 04/01/2020   NA 140 04/01/2020   K 3.4 (L) 04/01/2020   CL 105 04/01/2020   CO2 21 (L) 04/01/2020    Lab Results  Component Value Date   ALT 33 04/01/2020   AST 34 04/01/2020   ALKPHOS 126 04/01/2020   BILITOT 0.8 04/01/2020     Microbiology: Recent Results (from the past 240 hour(s))  Resp Panel by RT-PCR (Flu A&B, Covid) Nasopharyngeal Swab     Status: None   Collection Time: 03/26/20 11:19 AM   Specimen: Nasopharyngeal Swab; Nasopharyngeal(NP) swabs in vial transport medium  Result Value Ref Range Status   SARS Coronavirus 2 by RT PCR NEGATIVE NEGATIVE Final    Comment: (NOTE) SARS-CoV-2 target nucleic  acids are NOT DETECTED.  The SARS-CoV-2 RNA is generally detectable in upper respiratory specimens during the acute phase of infection. The lowest concentration of SARS-CoV-2 viral copies this assay can detect is 138 copies/mL. A negative result does not preclude SARS-Cov-2 infection and should not be used as the sole basis for treatment or other patient management decisions. A negative result may occur with  improper specimen collection/handling, submission of specimen other than nasopharyngeal swab, presence of viral mutation(s) within the areas targeted by this assay, and inadequate number of viral copies(<138 copies/mL). A negative result must be combined with clinical observations, patient history, and epidemiological information. The expected result is Negative.  Fact Sheet for Patients:  EntrepreneurPulse.com.au  Fact Sheet for Healthcare Providers:  IncredibleEmployment.be  This test is no t yet  approved or cleared by the [place] and  has been authorized for detection and/or diagnosis of SARS-CoV-2 by FDA under an Emergency Use Authorization (EUA). This EUA will remain  in effect (meaning this test can be used) for the duration of the COVID-19 declaration under Section 564(b)(1) of the Act, 21 U.S.C.section 360bbb-3(b)(1), unless the authorization is terminated  or revoked sooner.       Influenza A by PCR NEGATIVE NEGATIVE Final   Influenza B by PCR NEGATIVE NEGATIVE Final    Comment: (NOTE) The Xpert Xpress SARS-CoV-2/FLU/RSV plus assay is intended as an aid in the diagnosis of influenza from Nasopharyngeal swab specimens and should not be used as a sole basis for treatment. Nasal washings and aspirates are unacceptable for Xpert Xpress SARS-CoV-2/FLU/RSV testing.  Fact Sheet for Patients: BloggerCourse.com  Fact Sheet for Healthcare Providers: SeriousBroker.it  This  test is not yet approved or cleared by the [place] FDA and has been authorized for detection and/or diagnosis of SARS-CoV-2 by FDA under an Emergency Use Authorization (EUA). This EUA will remain in effect (meaning this test can be used) for the duration of the COVID-19 declaration under Section 564(b)(1) of the Act, 21 U.S.C. section 360bbb-3(b)(1), unless the authorization is terminated or revoked.  Performed at [facility], [address], [place], [place] [address]   Culture, blood (Routine X 2) w Reflex to ID Panel     Status: None (Preliminary result)   Collection Time: 03/31/20  6:22 AM   Specimen: BLOOD RIGHT FOREARM  Result Value Ref Range Status   Specimen Description BLOOD RIGHT FOREARM  Final   Special Requests   Final    BOTTLES DRAWN AEROBIC ONLY Blood Culture adequate volume   Culture   Final    NO GROWTH < 24 HOURS Performed at [facility], [address], [place], [place] [address]    Report Status PENDING  Incomplete  Culture, blood (Routine X 2) w Reflex to ID Panel     Status: None (Preliminary result)   Collection Time: 03/31/20  6:22 AM   Specimen: BLOOD RIGHT WRIST  Result Value Ref Range Status   Specimen Description BLOOD RIGHT WRIST  Final   Special Requests   Final    BOTTLES DRAWN AEROBIC ONLY Blood Culture results may not be optimal due to an inadequate volume of blood received in culture bottles   Culture   Final    NO GROWTH < 24 HOURS Performed at [facility], [address], [place], [place] [address]    Report Status PENDING  Incomplete    Imaging: 12/26 bilateral knee xray  I personally reviewed images suggestive of chondrocalcinosis bilateral knees along with OA    Raymondo Band, MD [facility] [phone] pager    04/01/2020, 1:03 PM

## 2020-04-02 DIAGNOSIS — R4182 Altered mental status, unspecified: Secondary | ICD-10-CM | POA: Diagnosis not present

## 2020-04-02 DIAGNOSIS — R410 Disorientation, unspecified: Secondary | ICD-10-CM | POA: Diagnosis not present

## 2020-04-02 DIAGNOSIS — N3001 Acute cystitis with hematuria: Secondary | ICD-10-CM | POA: Diagnosis not present

## 2020-04-02 DIAGNOSIS — N179 Acute kidney failure, unspecified: Secondary | ICD-10-CM | POA: Diagnosis not present

## 2020-04-02 LAB — GLUCOSE, CAPILLARY
Glucose-Capillary: 92 mg/dL (ref 70–99)
Glucose-Capillary: 119 mg/dL — ABNORMAL HIGH (ref 70–99)
Glucose-Capillary: 208 mg/dL — ABNORMAL HIGH (ref 70–99)
Glucose-Capillary: 73 mg/dL (ref 70–99)

## 2020-04-02 LAB — CBC
HCT: 23.7 % — ABNORMAL LOW (ref 36.0–46.0)
Hemoglobin: 7.3 g/dL — ABNORMAL LOW (ref 12.0–15.0)
MCH: 25 pg — ABNORMAL LOW (ref 26.0–34.0)
MCHC: 30.8 g/dL (ref 30.0–36.0)
MCV: 81.2 fL (ref 80.0–100.0)
Platelets: 779 10*3/uL — ABNORMAL HIGH (ref 150–400)
RBC: 2.92 MIL/uL — ABNORMAL LOW (ref 3.87–5.11)
RDW: 18 % — ABNORMAL HIGH (ref 11.5–15.5)
WBC: 7.4 10*3/uL (ref 4.0–10.5)
nRBC: 0 % (ref 0.0–0.2)

## 2020-04-02 LAB — BASIC METABOLIC PANEL
Anion gap: 11 (ref 5–15)
BUN: 19 mg/dL (ref 8–23)
CO2: 24 mmol/L (ref 22–32)
Calcium: 8.5 mg/dL — ABNORMAL LOW (ref 8.9–10.3)
Chloride: 106 mmol/L (ref 98–111)
Creatinine, Ser: 1.1 mg/dL — ABNORMAL HIGH (ref 0.44–1.00)
GFR, Estimated: 53 mL/min — ABNORMAL LOW (ref >60)
Glucose, Bld: 194 mg/dL — ABNORMAL HIGH (ref 70–99)
Potassium: 3.1 mmol/L — ABNORMAL LOW (ref 3.5–5.1)
Sodium: 141 mmol/L (ref 135–145)

## 2020-04-02 MED ORDER — POTASSIUM CHLORIDE CRYS ER 20 MEQ PO TBCR
40.0000 meq | EXTENDED_RELEASE_TABLET | ORAL | Status: AC
  Administered 2020-04-02 (×2): 40 meq via ORAL
  Filled 2020-04-02 (×2): qty 2

## 2020-04-02 NOTE — Progress Notes (Signed)
Physical Therapy Treatment Patient Details Name: [name] [name] MRN: [id number] DOB: 05-06-1945 Today's Date: 04/02/2020    History of Present Illness Pt is a 74 y.o. female admitted 03/16/20 found down by EMS after daughter requested wellness check having not heard from her for a couple days. Workup for metabolic encephalopathy, ARF, dehydration. Pt with L hip pain, xray with moderate osteoarthritis. Head CT negative for acute abnormality; age-related atrophy. MRI suggestive of Wenicke encephalopathy. PMH includes CKD3, CHF, DM2, HTN, OA, chronic lymphedema, venous stasis ulcers. 12/21 Discharge cancelled pt spiked fever and HR elevated.    PT Comments    Pt supine in bed on arrival.  Pt voiced," Can I be seen in the morning next time."  Pt presents with very painful and stiff joints. Focused on rolling to clean patient of bowel and bladder incontinence.  Pt very limited to assist in rolling.  PTA then focused on low level bed exercises to engage musculature.  Pt with red irritated skin in perineum, reached out to MD who placed consult for wound care. Continue to recommend snf placement at this time.     Follow Up Recommendations  SNF;Supervision/Assistance - 24 hour     Equipment Recommendations  Rolling walker with 5" wheels;3in1 (PT);Wheelchair (measurements PT);Wheelchair cushion (measurements PT);Hospital bed    Recommendations for Other Services       Precautions / Restrictions Precautions Precautions: Fall Restrictions Weight Bearing Restrictions: No    Mobility  Bed Mobility Overal bed mobility: Needs Assistance Bed Mobility: Rolling Rolling: Total assist;+2 for physical assistance         General bed mobility comments: Performed rolling to R and left side.  Pt with poor ability to assist and aid in rolling.  Focused on rolling to clean patient of urinary and fecal incontinence.  Skin very red and irritated with multiple areas of break down.  Transfers Overall  transfer level:  (Unable very painful with poor participation during rolling.)                  Ambulation/Gait                 Stairs             Wheelchair Mobility    Modified Rankin (Stroke Patients Only)       Balance                                            Cognition Arousal/Alertness: Awake/alert Behavior During Therapy: Flat affect   Area of Impairment: Problem solving;Safety/judgement;Following commands                       Following Commands: Follows one step commands with increased time Safety/Judgement: Decreased awareness of deficits;Decreased awareness of safety   Problem Solving: Slow processing;Decreased initiation;Difficulty sequencing;Requires verbal cues;Requires tactile cues        Exercises General Exercises - Lower Extremity Ankle Circles/Pumps: AROM;Strengthening;Both;10 reps;Seated Quad Sets: AROM;Both;10 reps;Supine Heel Slides: Both;10 reps;Supine;AAROM Hip ABduction/ADduction: Both;10 reps;AAROM;Supine Shoulder Exercises Elbow Flexion: AROM;Both;10 reps;Supine Elbow Extension: AROM;Both;10 reps;Supine Digit Composite Flexion: AROM;Both;10 reps;Supine Other Exercises Other Exercises: Cervical rotation to R and L side x 10 reps in supine. Other Exercises: B shoulder elevation x 10 reps in supine. Other Exercises: Ankle circles B LEs x 10 reps.    General Comments        Pertinent  Vitals/Pain Pain Assessment: Faces Faces Pain Scale: Hurts even more Pain Location: all 4 extremities with movement.  very painful in perineum during pericare. Pain Descriptors / Indicators: Grimacing;Moaning;Guarding Pain Intervention(s): Monitored during session;Repositioned    Home Living                      Prior Function            PT Goals (current goals can now be found in the care plan section) Acute Rehab PT Goals Patient Stated Goal: unable Potential to Achieve Goals:  Fair Progress towards PT goals: Progressing toward goals    Frequency    Min 2X/week      PT Plan Current plan remains appropriate    Co-evaluation              AM-PAC PT "6 Clicks" Mobility   Outcome Measure  Help needed turning from your back to your side while in a flat bed without using bedrails?: Total Help needed moving from lying on your back to sitting on the side of a flat bed without using bedrails?: Total Help needed moving to and from a bed to a chair (including a wheelchair)?: Total Help needed standing up from a chair using your arms (e.g., wheelchair or bedside chair)?: Total Help needed to walk in hospital room?: Total Help needed climbing 3-5 steps with a railing? : Total 6 Click Score: 6    End of Session Equipment Utilized During Treatment: Gait belt Activity Tolerance: Patient limited by pain Patient left: in bed;with call bell/phone within reach;with bed alarm set Nurse Communication: Mobility status (need for wound care consult, spoke with MD who placed consult.) PT Visit Diagnosis: Other abnormalities of gait and mobility (R26.89);Muscle weakness (generalized) (M62.81)     Time: 6294-7654 PT Time Calculation (min) (ACUTE ONLY): 34 min  Charges:  $Therapeutic Exercise: 8-22 mins $Therapeutic Activity: 8-22 mins                     Bonney Leitz , PTA Acute Rehabilitation Services Pager [phone] Office [phone]     Alliene Klugh Artis Delay 04/02/2020, 6:22 PM

## 2020-04-02 NOTE — Progress Notes (Signed)
Nutrition Follow-up  DOCUMENTATION CODES:   Severe malnutrition in context of social or environmental circumstances  INTERVENTION:   -Continue Magic cup TID with meals, each supplement provides 290 kcal and 9 grams of protein -Continue MVI with minerals daily -Continue 30 ml Prosource Plus TID, each supplement provides 100 kcals and 15 grams protein -Continue Boost Breeze po TID, each supplement provides 250 kcal and 9 grams of protein  NUTRITION DIAGNOSIS:   Severe Malnutrition related to social / environmental circumstances as evidenced by percent weight loss,mild fat depletion,moderate fat depletion,mild muscle depletion,moderate muscle depletion.  Ongoing  GOAL:   Patient will meet greater than or equal to 90% of their needs  Progressing   MONITOR:   PO intake,Supplement acceptance,Labs,Weight trends,Skin,I & O's  REASON FOR ASSESSMENT:   Malnutrition Screening Tool    ASSESSMENT:   [name] [name] is a 74 y.o. female with medical history significant for DMT2, CKD, CHF, HTN, OA who presents by EMS. Her daughter had not been able to get in touch with [name] [name] for past two days and she called the police to do a safety check on her.  Found by police sitting on the floor, leaning forward in the central living space with bugs crawling on her. Patient was next to her walker sitting in her soiled clothes with excrement on the floor around her. Patient reportedly denied pain including chest pain, back pain, abdominal pain and SOB. Pt is now sleeping on gurney in ER and does not respond to questions or follow commands. ER provider reported that pt was initially confused when she arrived. Per chart review, patient seen on 03/08/20 by her PCP for intractable N/V x 4-6 weeks with profound weakness, lightheadedness.  12/16- s/ps BSE- downgraded to dysphagia 2 diet with thin liquids 12/17- s/p EGD- revealed grade C reflux esophagitis with no stricture, antral biopsies performed to rule  out H. Pylori, multiple non-bleeding duodenal ulcers  12/21- advanced to regular diet with thin liquids  Reviewed I/O's: -200 ml x 24 hours and +2.3 L since 03/19/20  UOP: 200 ml x 24 hours  Pt's intake has improved. Noted meal completions 25-75%, averaging around 50% of meals. Pt is taking Prosource and Boost Breeze supplements.   Medications reviewed and include miralax, senokot, and thiamine.  Plan for SNF once medically stable for discharge.   Labs reviewed: K: 3.1, CBGS: 92-119 (inpatient orders for glycemic control are 0-5 units insulin aspart daily at bedtime and 0-9 units insulin aspart TID with meals).   Diet Order:   Diet Order            Diet Carb Modified Fluid consistency: Thin; Room service appropriate? No  Diet effective now                 EDUCATION NEEDS:   Not appropriate for education at this time  Skin:  Skin Assessment: Skin Integrity Issues: Skin Integrity Issues:: Other (Comment) Other: MASD sacrum, bilateral leg venous stasis ulcers  Last BM:  04/01/20  Height:   Ht Readings from Last 1 Encounters:  03/18/20 5\' 10"  (1.778 m)    Weight:   Wt Readings from Last 1 Encounters:  03/19/20 113.4 kg    Ideal Body Weight:  72.7 kg  BMI:  Body mass index is 35.87 kg/m.  Estimated Nutritional Needs:   Kcal:  2200-2400  Protein:  130-145 grams  Fluid:  > 2 L    03/21/20, RD, LDN, CDCES Registered Dietitian II Certified Diabetes Care and Education  Specialist Please refer to [facility] for RD and/or RD on-call/weekend/after hours pager

## 2020-04-02 NOTE — Progress Notes (Signed)
[facility] Infectious Diseases Follow Up Note  Patient Identification: Patient Name: [name] [name] MRN: [id number] [place] Date: 03/16/2020 10:39 PM Age: 74 y.o.Today's Date: 04/02/2020   Reason for Visit: Fevers   Principal Problem:   UTI (urinary tract infection) Active Problems:   HTN (hypertension)   DM type 2 (diabetes mellitus, type 2) (HCC)   Fever   Severe sepsis without septic shock (HCC)   Hypothermia   Acute kidney injury superimposed on chronic kidney disease (HCC)   Delirium   Protein-calorie malnutrition, severe   Duodenal ulcer   Acute esophagitis   Wernicke's aphasia   Polyarthralgia   Antibiotics: None   Interval Events: Low grade fever yesterday at 100.5, no leukocytosis, hemodynamically stable  Doxycycline stopped from today   Assessment 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 She is able to somewhat move her joints of Upper extremities without significant pain. She has not noticed any difference in pain of her joints since colchicine was started although it is too early to tell. Less likely this to be septic arthritis of multiple joints without positive blood cultures with no clinical exam suugestive of septic arhtritis  She had one episode of low grade fever but no other findings concerning for infection. Will follow her fever curve and watch her off antibiotics while she is getting colchicine trial  HCV ab  ESR and CRP  Rest of the management as per the primary team. Thank you for the consult. Please page with pertinent questions or concerns.  ______________________________________________________________________ Subjective patient seen and examined at the bedside. She is lying in the bed and having her breakfast.    Vitals BP 101/63 (BP  Location: Left Arm)   Pulse 86   Temp 98.6 F (37 C)   Resp 20   Ht _0  (1.778 m)   Wt 113.4 kg   SpO2 100%   BMI 35.87 kg/m    Obese lady, No distress, sitting up in be, eating breakfast  Normocephalic;throat clear Neck supple cv rrr no mrg Lungs normal respiratory effort; ctabl abd s/nt Ext chronic bilateral LE edema ?( h/o lymphedema per patient) with atrophic hypopigmentation  Tenderness in bilateral elbows, wrists, knees, shoudlers and ankles  Neuro generalized weakness Psych alert/oriented, calm  Pertinent Microbiology Results for orders placed or performed during the hospital encounter of 03/16/20  Resp Panel by RT-PCR (Flu A&B, Covid) Nasopharyngeal Swab     Status: None   Collection Time: 03/16/20 11:13 PM   Specimen: Nasopharyngeal Swab; Nasopharyngeal(NP) swabs in vial transport medium  Result Value Ref Range Status   SARS Coronavirus 2 by RT PCR NEGATIVE NEGATIVE Final    Comment: (NOTE) SARS-CoV-2 target nucleic acids are NOT DETECTED.  The SARS-CoV-2 RNA is generally detectable in upper respiratory specimens during the acute phase of infection. The lowest concentration of SARS-CoV-2 viral copies this assay can detect is 138 copies/mL. A negative result does not preclude SARS-Cov-2 infection and should not be used as the sole basis for treatment or other patient management decisions. A negative result may occur with  improper specimen collection/handling, submission of specimen other than nasopharyngeal swab, presence of viral mutation(s) within the areas targeted by this assay, and inadequate number of viral copies(<138 copies/mL). A negative result must be combined with clinical observations, patient history, and epidemiological information. The expected result is Negative.  Fact Sheet for Patients:  EntrepreneurPulse.com.au  Fact  Sheet for Healthcare Providers:  IncredibleEmployment.be  This test is no t yet  approved or cleared by the [place] FDA and  has been authorized for detection and/or diagnosis of SARS-CoV-2 by FDA under an Emergency Use Authorization (EUA). This EUA will remain  in effect (meaning this test can be used) for the duration of the COVID-19 declaration under Section 564(b)(1) of the Act, 21 U.S.C.section 360bbb-3(b)(1), unless the authorization is terminated  or revoked sooner.       Influenza A by PCR NEGATIVE NEGATIVE Final   Influenza B by PCR NEGATIVE NEGATIVE Final    Comment: (NOTE) The Xpert Xpress SARS-CoV-2/FLU/RSV plus assay is intended as an aid in the diagnosis of influenza from Nasopharyngeal swab specimens and should not be used as a sole basis for treatment. Nasal washings and aspirates are unacceptable for Xpert Xpress SARS-CoV-2/FLU/RSV testing.  Fact Sheet for Patients: EntrepreneurPulse.com.au  Fact Sheet for Healthcare Providers: IncredibleEmployment.be  This test is not yet approved or cleared by the [place] FDA and has been authorized for detection and/or diagnosis of SARS-CoV-2 by FDA under an Emergency Use Authorization (EUA). This EUA will remain in effect (meaning this test can be used) for the duration of the COVID-19 declaration under Section 564(b)(1) of the Act, 21 U.S.C. section 360bbb-3(b)(1), unless the authorization is terminated or revoked.  Performed at [facility], [place] [address] [place], [place] [address]   Blood culture (routine x 2)     Status: None   Collection Time: 03/17/20  1:04 AM   Specimen: BLOOD  Result Value Ref Range Status   Specimen Description BLOOD RIGHT ANTECUBITAL  Final   Special Requests   Final    BOTTLES DRAWN AEROBIC AND ANAEROBIC Blood Culture results may not be optimal due to an inadequate volume of blood received in culture bottles   Culture   Final    NO GROWTH 5 DAYS Performed at [facility], [place] [address] [place], [place]  [address]    Report Status 03/22/2020 FINAL  Final  Blood culture (routine x 2)     Status: None   Collection Time: 03/17/20  2:07 AM   Specimen: BLOOD RIGHT HAND  Result Value Ref Range Status   Specimen Description BLOOD RIGHT HAND  Final   Special Requests   Final    BOTTLES DRAWN AEROBIC AND ANAEROBIC Blood Culture results may not be optimal due to an inadequate volume of blood received in culture bottles   Culture   Final    NO GROWTH 5 DAYS Performed at [facility], [place] [address], [place],  [address]    Report Status 03/22/2020 FINAL  Final  Urine culture     Status: Abnormal   Collection Time: 03/17/20 11:50 AM   Specimen: Urine, Random  Result Value Ref Range Status   Specimen Description URINE, RANDOM  Final   Special Requests   Final    NONE Performed at [facility], [place] [address] [place], [place] [address]    Culture 20,000 COLONIES/mL ENTEROCOCCUS FAECALIS (A)  Final   Report Status 03/19/2020 FINAL  Final   Organism ID, Bacteria ENTEROCOCCUS FAECALIS (A)  Final      Susceptibility   Enterococcus faecalis - MIC*    AMPICILLIN <=2 SENSITIVE Sensitive     NITROFURANTOIN <=16 SENSITIVE Sensitive     VANCOMYCIN 2 SENSITIVE Sensitive     * 20,000 COLONIES/mL ENTEROCOCCUS FAECALIS  Resp Panel by RT-PCR (Flu A&B,  Covid) Nasopharyngeal Swab     Status: None   Collection Time: 03/26/20 11:19 AM   Specimen: Nasopharyngeal Swab; Nasopharyngeal(NP) swabs in vial transport medium  Result Value Ref Range Status   SARS Coronavirus 2 by RT PCR NEGATIVE NEGATIVE Final    Comment: (NOTE) SARS-CoV-2 target nucleic acids are NOT DETECTED.  The SARS-CoV-2 RNA is generally detectable in upper respiratory specimens during the acute phase of infection. The lowest concentration of SARS-CoV-2 viral copies this assay can detect is 138 copies/mL. A negative result does not preclude SARS-Cov-2 infection and should not be used as the sole basis for treatment or other  patient management decisions. A negative result may occur with  improper specimen collection/handling, submission of specimen other than nasopharyngeal swab, presence of viral mutation(s) within the areas targeted by this assay, and inadequate number of viral copies(<138 copies/mL). A negative result must be combined with clinical observations, patient history, and epidemiological information. The expected result is Negative.  Fact Sheet for Patients:  EntrepreneurPulse.com.au  Fact Sheet for Healthcare Providers:  IncredibleEmployment.be  This test is no t yet approved or cleared by the [place] FDA and  has been authorized for detection and/or diagnosis of SARS-CoV-2 by FDA under an Emergency Use Authorization (EUA). This EUA will remain  in effect (meaning this test can be used) for the duration of the COVID-19 declaration under Section 564(b)(1) of the Act, 21 U.S.C.section 360bbb-3(b)(1), unless the authorization is terminated  or revoked sooner.       Influenza A by PCR NEGATIVE NEGATIVE Final   Influenza B by PCR NEGATIVE NEGATIVE Final    Comment: (NOTE) The Xpert Xpress SARS-CoV-2/FLU/RSV plus assay is intended as an aid in the diagnosis of influenza from Nasopharyngeal swab specimens and should not be used as a sole basis for treatment. Nasal washings and aspirates are unacceptable for Xpert Xpress SARS-CoV-2/FLU/RSV testing.  Fact Sheet for Patients: EntrepreneurPulse.com.au  Fact Sheet for Healthcare Providers: IncredibleEmployment.be  This test is not yet approved or cleared by the [place] FDA and has been authorized for detection and/or diagnosis of SARS-CoV-2 by FDA under an Emergency Use Authorization (EUA). This EUA will remain in effect (meaning this test can be used) for the duration of the COVID-19 declaration under Section 564(b)(1) of the Act, 21 U.S.C. section  360bbb-3(b)(1), unless the authorization is terminated or revoked.  Performed at [facility], [place] [address], [place], [place] [address]   Culture, blood (Routine X 2) w Reflex to ID Panel     Status: None (Preliminary result)   Collection Time: 03/31/20  6:22 AM   Specimen: BLOOD RIGHT FOREARM  Result Value Ref Range Status   Specimen Description BLOOD RIGHT FOREARM  Final   Special Requests   Final    BOTTLES DRAWN AEROBIC ONLY Blood Culture adequate volume   Culture   Final    NO GROWTH < 24 HOURS Performed at [facility], [place] [address], [place], [place] [address]    Report Status PENDING  Incomplete  Culture, blood (Routine X 2) w Reflex to ID Panel     Status: None (Preliminary result)   Collection Time: 03/31/20  6:22 AM   Specimen: BLOOD RIGHT WRIST  Result Value Ref Range Status   Specimen Description BLOOD RIGHT WRIST  Final   Special Requests   Final    BOTTLES DRAWN AEROBIC ONLY Blood Culture results may not be optimal due to an inadequate volume of blood received in culture bottles  Culture   Final    NO GROWTH < 24 HOURS Performed at [facility], [place] [address] [place], [place] [address]    Report Status PENDING  Incomplete    Pertinent Lab. CBC Latest Ref Rng & Units 04/02/2020 04/01/2020 03/31/2020  WBC 4.0 - 10.5 K/uL 7.4 6.7 7.5  Hemoglobin 12.0 - 15.0 g/dL 7.3(L) 8.3(L) 8.4(L)  Hematocrit 36.0 - 46.0 % 23.7(L) 27.2(L) 26.8(L)  Platelets 150 - 400 K/uL 779(H) 646(H) 835(H)   CMP Latest Ref Rng & Units 04/02/2020 04/01/2020 03/31/2020  Glucose 70 - 99 mg/dL 194(H) 110(H) 112(H)  BUN 8 - 23 mg/dL _0 Creatinine 0.44 - 1.00 mg/dL 1.10(H) 1.05(H) 1.01(H)  Sodium 135 - 145 mmol/L 141 140 142  Potassium 3.5 - 5.1 mmol/L 3.1(L) 3.4(L) 2.7(LL)  Chloride 98 - 111 mmol/L 106 105 107  CO2 22 - 32 mmol/L 24 21(L) 24  Calcium 8.9 - 10.3 mg/dL 8.5(L) 8.6(L) 8.5(L)  Total Protein 6.5 - 8.1 g/dL - 5.6(L) 5.7(L)  Total Bilirubin 0.3 - 1.2  mg/dL - 0.8 0.7  Alkaline Phos 38 - 126 U/L - 126 133(H)  AST 15 - 41 U/L - 34 34  ALT 0 - 44 U/L - 33 37     Pertinent Imaging today Plain films and CT images have been personally visualized and interpreted; radiology reports have been reviewed. Decision making incorporated into the Impression / Recommendations.  I have spent approx 30 minutes for this patient encounter including review of prior medical records with greater than 50% of time being face to face and coordination of their care.  Electronically signed by:   Rosiland Oz, MD Infectious Disease Physician [facility] Pager: [phone]

## 2020-04-02 NOTE — Progress Notes (Addendum)
[facility]  PROGRESS NOTE  Edgar Giove [id number] DOB: 02/08/1946 DOA: 03/16/2020 PCP: Hoyt Koch, MD   Brief HPI:   74 year old female with a history of diabetes mellitus type 2, chronic kidney disease stage IIIa, CHF, chronic lymphedema, hypertension, osteoarthritis was brought to ED by EMS.  Daughter reported that she has not been able to get in touch with her mom for past couple of days so she called the police due to safety check on her and found her lying on the floor covered with stool and bugs crawling all over her.  She was seen by her PCP for intractable nausea vomiting for 4 to 6 weeks, associated with profound weakness and was also treated for UTI.  She was admitted with acute metabolic encephalopathy. CT head showed no acute abnormality.  Her mental status had gradually improved.  She underwent EGD on 03/23/2020 which showed reflux esophagitis, multiple large and small nonbleeding duodenal ulcers.    Subjective   Patient seen and examined, mental status improving.  She is continuing to complain of pain in her joints.   Assessment/Plan:    1. Acute metabolic encephalopathy- multifactorial from acute kidney injury, dehydration, hyponatremia also concern for Wernicke's encephalopathy.  She was initially placed on IV ceftriaxone due to concern for UTI but cultures only grew 20,000 colonies of Enterococcus.  She received 3 days of intravenous antibiotics followed by a dose of fosfomycin on 03/19/2020.  CT head, ammonia, TSH, B12, cortisol were unremarkable.  MRI brain showed findings suggestive of Wernicke's encephalopathy.  B1 level was low.  Currently patient is on thiamine supplementation 100 mg po daily.  She did receive high-dose IV thiamine for 10 days in the hospital. 2. Fever-patient was supposed to discharge to skilled nursing facility on 03/26/2020 however she developed fever.  She did have a UTI with Enterococcus which was treated with fosfomycin.   Urinalysis showed rare bacteria and moderate leukocytes.  Another potential source could be from sacral ulcers though they look quite superficial.  MRI pelvis did not show deep abscess however raised the possibility of underlying cellulitis with local lymphadenopathy.  She was started on doxycycline 03/28/2020. Initially had fever improved and ID was consulted.  She was found to have swelling and warmth of right elbow and right knee, x-ray of the joint shows polyarticular pseudogout.  Patient started on colchicine.         -Being followed by infectious disease.  She is off antibiotics at this time. She is continuing to complain of some pain in her joints. Per ID likely polyarticular pseudogout, continue with supportive care/anti-inflammatory.  Low suspicion for infectious syndrome. 3. Polyarticular pseudogout-confirmed on x-ray of right elbow and knee.  Started on anti-inflammatory medication, colchicine 0.6 mg daily, if no improvement consider arthrocentesis to send for crystal analysis, cell count/culture.  ID following. 4. Chronic nausea and vomiting/dysphagia-unclear etiology, GI was consulted, patient underwent EGD on 03/23/2020 which showed grade C reflux esophagitis in the distal one third of the esophagus, no stricture noted.  Also showed multiple large and small nonbleeding duodenal ulcers with pigmented material in the bulb and postbulbar area.  Continue PPI, Carafate.  Avoid NSAIDs.  Speech therapy saw the patient and recommended dysphagia 2 diet with thin liquids. 5. Acute kidney injury on CKD stage IIIa-baseline creatinine around 1.5, improved with IV fluids. 6. Hypernatremia-secondary to dehydration, received IV fluids and sodium.  Improved. 7. Hypokalemia, hypomagnesemia, hypophosphatemia-today potassium is 3.1, will replace potassium and follow BMP and magnesium in am.  8. Anemia-hemoglobin today 7.3.  Will order labs in AM.  Monitor.  If the hemoglobin is dropping further may need to consider  checking stool for occult blood. 9. Thrombocytosis-likely in setting of inflammation/infection.  Platelet count is 779 today.  COVID-19 Labs  No results for input(s): DDIMER, FERRITIN, LDH, CRP in the last 72 hours.  Lab Results  Component Value Date   SARSCOV2NAA NEGATIVE 03/26/2020   SARSCOV2NAA NEGATIVE 03/16/2020   [place] NEGATIVE 01/06/2019     Scheduled medications:   . (feeding supplement) PROSource Plus  30 mL Oral TID WC  . sodium chloride   Intravenous Once  . colchicine  0.6 mg Oral Daily  . enoxaparin (LOVENOX) injection  40 mg Subcutaneous Q24H  . feeding supplement  1 Container Oral TID BM  . free water  100 mL Oral Q4H  . insulin aspart  0-5 Units Subcutaneous QHS  . insulin aspart  0-9 Units Subcutaneous TID WC  . multivitamin with minerals  1 tablet Oral Daily  . pantoprazole  40 mg Oral BID  . polyethylene glycol  17 g Oral BID  . senna  2 tablet Oral Daily  . sucralfate  1 g Oral TID WC & HS  . thiamine  100 mg Oral Daily    CBG: Recent Labs  Lab 04/01/20 0757 04/01/20 1126 04/01/20 1646 04/01/20 2131 04/02/20 0754  GLUCAP 107* 167* 103* 118* 92    SpO2: 100 %    CBC: Recent Labs  Lab 03/28/20 0103 03/29/20 0501 03/31/20 1001 04/01/20 0644 04/02/20 0047  WBC 6.4 6.3 7.5 6.7 7.4  HGB 7.4* 8.1* 8.4* 8.3* 7.3*  HCT 23.0* 26.2* 26.8* 27.2* 23.7*  MCV 78.8* 80.6 80.5 81.9 81.2  PLT 484* 655* 835* 646* 779*    Basic Metabolic Panel: Recent Labs  Lab 03/27/20 1253 03/28/20 0103 03/31/20 1001 04/01/20 0644 04/02/20 0047  NA 140 142 142 140 141  K 3.7 3.5 2.7* 3.4* 3.1*  CL 106 109 107 105 106  CO2 21* 26 24 21* 24  GLUCOSE 141* 105* 112* 110* 194*  BUN 22 21 19 17 19   CREATININE 1.47* 1.44* 1.01* 1.05* 1.10*  CALCIUM 8.3* 8.2* 8.5* 8.6* 8.5*  MG  --   --   --  1.6*  --   PHOS  --   --   --  2.6  --      Liver Function Tests: Recent Labs  Lab 03/28/20 0103 03/31/20 1001 04/01/20 0644  AST 26 34 34  ALT 18 37 33   ALKPHOS 99 133* 126  BILITOT 0.5 0.7 0.8  PROT 5.2* 5.7* 5.6*  ALBUMIN 1.6* 1.7* 1.7*     Antibiotics: Anti-infectives (From admission, onward)   Start     Dose/Rate Route Frequency Ordered Stop   03/28/20 1200  doxycycline (VIBRA-TABS) tablet 100 mg  Status:  Discontinued        100 mg Oral Every 12 hours 03/28/20 1101 04/01/20 1150   03/26/20 1500  cefTRIAXone (ROCEPHIN) 1 g in sodium chloride 0.9 % 100 mL IVPB  Status:  Discontinued        1 g 200 mL/hr over 30 Minutes Intravenous Every 24 hours 03/26/20 1410 03/27/20 1457   03/19/20 1300  fosfomycin (MONUROL) packet 3 g        3 g Oral  Once 03/19/20 1159 03/19/20 1415   03/18/20 0600  cefTRIAXone (ROCEPHIN) 1 g in sodium chloride 0.9 % 100 mL IVPB  Status:  Discontinued  1 g 200 mL/hr over 30 Minutes Intravenous Every 24 hours 03/17/20 0519 03/19/20 1159   03/17/20 0415  cefTRIAXone (ROCEPHIN) 1 g in sodium chloride 0.9 % 100 mL IVPB        1 g 200 mL/hr over 30 Minutes Intravenous  Once 03/17/20 0406 03/17/20 0448       DVT prophylaxis: Lovenox  Code Status: Full code  Family Communication: No family at bedside   Consultants:  GI, urology, infectious disease  Procedures:  EGD on 03/23/2020    Objective   Vitals:   04/01/20 1730 04/01/20 1940 04/01/20 2331 04/02/20 0333  BP: (!) 91/56 115/70 102/63 101/63  Pulse: (!) 102 (!) 106 97 86  Resp: 16 16  20   Temp: (!) 100.5 F (38.1 C) 98.7 F (37.1 C) 98.9 F (37.2 C) 98.6 F (37 C)  TempSrc: Oral     SpO2:  97% 100% 100%  Weight:      Height:        Intake/Output Summary (Last 24 hours) at 04/02/2020 1028 Last data filed at 04/02/2020 0800 Gross per 24 hour  Intake 237 ml  Output 200 ml  Net 37 ml    12/26 1901 - 12/28 0700 In: 100 [P.O.:100] Out: 600 [Urine:600]  Filed Weights   03/19/20 1100  Weight: 113.4 kg    Physical Examination:  General-awake and oriented, appears in no acute distress Heart-S1-S2, regular, no murmur  auscultated Lungs-clear to auscultation bilaterally, no wheezing or crackles auscultated Abdomen-soft, nontender, no organomegaly Extremities-has tenderness on the joints of the hands and lower extremity, b/l lower extremity lymphedema Neuro-alert, oriented x3, debility with generalized weakness  Status is: Inpatient  Dispo: The patient is from: Home              Anticipated d/c is to: Skilled nursing facility              Anticipated d/c date is: 04/03/2020 pending improvement              Patient currently not stable for discharge  Barrier to discharge- ongoing work up for fever   Data Reviewed:   Recent Results (from the past 240 hour(s))  Resp Panel by RT-PCR (Flu A&B, Covid) Nasopharyngeal Swab     Status: None   Collection Time: 03/26/20 11:19 AM   Specimen: Nasopharyngeal Swab; Nasopharyngeal(NP) swabs in vial transport medium  Result Value Ref Range Status   SARS Coronavirus 2 by RT PCR NEGATIVE NEGATIVE Final    Comment: (NOTE) SARS-CoV-2 target nucleic acids are NOT DETECTED.  The SARS-CoV-2 RNA is generally detectable in upper respiratory specimens during the acute phase of infection. The lowest concentration of SARS-CoV-2 viral copies this assay can detect is 138 copies/mL. A negative result does not preclude SARS-Cov-2 infection and should not be used as the sole basis for treatment or other patient management decisions. A negative result may occur with  improper specimen collection/handling, submission of specimen other than nasopharyngeal swab, presence of viral mutation(s) within the areas targeted by this assay, and inadequate number of viral copies(<138 copies/mL). A negative result must be combined with clinical observations, patient history, and epidemiological information. The expected result is Negative.  Fact Sheet for Patients:  EntrepreneurPulse.com.au  Fact Sheet for Healthcare Providers:   IncredibleEmployment.be  This test is no t yet approved or cleared by the [place] FDA and  has been authorized for detection and/or diagnosis of SARS-CoV-2 by FDA under an Emergency Use Authorization (EUA). This EUA will remain  in effect (meaning this test can be used) for the duration of the COVID-19 declaration under Section 564(b)(1) of the Act, 21 U.S.C.section 360bbb-3(b)(1), unless the authorization is terminated  or revoked sooner.       Influenza A by PCR NEGATIVE NEGATIVE Final   Influenza B by PCR NEGATIVE NEGATIVE Final    Comment: (NOTE) The Xpert Xpress SARS-CoV-2/FLU/RSV plus assay is intended as an aid in the diagnosis of influenza from Nasopharyngeal swab specimens and should not be used as a sole basis for treatment. Nasal washings and aspirates are unacceptable for Xpert Xpress SARS-CoV-2/FLU/RSV testing.  Fact Sheet for Patients: EntrepreneurPulse.com.au  Fact Sheet for Healthcare Providers: IncredibleEmployment.be  This test is not yet approved or cleared by the [place] FDA and has been authorized for detection and/or diagnosis of SARS-CoV-2 by FDA under an Emergency Use Authorization (EUA). This EUA will remain in effect (meaning this test can be used) for the duration of the COVID-19 declaration under Section 564(b)(1) of the Act, 21 U.S.C. section 360bbb-3(b)(1), unless the authorization is terminated or revoked.  Performed at [facility], [place] [address], [place], [place] [address]   Culture, blood (Routine X 2) w Reflex to ID Panel     Status: None (Preliminary result)   Collection Time: 03/31/20  6:22 AM   Specimen: BLOOD RIGHT FOREARM  Result Value Ref Range Status   Specimen Description BLOOD RIGHT FOREARM  Final   Special Requests   Final    BOTTLES DRAWN AEROBIC ONLY Blood Culture adequate volume   Culture   Final    NO GROWTH < 24 HOURS Performed at [facility], [place] [address], [place], [place] [address]    Report Status PENDING  Incomplete  Culture, blood (Routine X 2) w Reflex to ID Panel     Status: None (Preliminary result)   Collection Time: 03/31/20  6:22 AM   Specimen: BLOOD RIGHT WRIST  Result Value Ref Range Status   Specimen Description BLOOD RIGHT WRIST  Final   Special Requests   Final    BOTTLES DRAWN AEROBIC ONLY Blood Culture results may not be optimal due to an inadequate volume of blood received in culture bottles   Culture   Final    NO GROWTH < 24 HOURS Performed at [facility], [place] [address], [place], [place] [address]    Report Status PENDING  Incomplete    No results for input(s): LIPASE, AMYLASE in the last 168 hours. No results for input(s): AMMONIA in the last 168 hours.  Cardiac Enzymes: No results for input(s): CKTOTAL, CKMB, CKMBINDEX, TROPONINI in the last 168 hours. BNP (last 3 results) No results for input(s): BNP in the last 8760 hours.  ProBNP (last 3 results) No results for input(s): PROBNP in the last 8760 hours.  Studies:  DG Knee 1-2 Views Left  Result Date: 03/31/2020 CLINICAL DATA:  BILATERAL knee pain and swelling, lymphedema EXAM: LEFT KNEE - 1-2 VIEW COMPARISON:  02/14/2018 FINDINGS: Osseous demineralization. Tricompartmental osteoarthritic changes with joint space narrowing and spur formation. Minimal chondrocalcinosis question CPPD. Small joint effusion. No acute fracture, dislocation, or bone destruction. IMPRESSION: Advanced tricompartmental osteoarthritic changes and suspect CPPD of LEFT knee. Osseous demineralization. Electronically Signed   By: Lavonia Dana M.D.   On: 03/31/2020 15:34   DG Knee 1-2 Views Right  Result Date: 03/31/2020 CLINICAL DATA:  BILATERAL knee pain and swelling, lymphedema EXAM: RIGHT KNEE - 1-2 VIEW COMPARISON:  02/14/2018 FINDINGS: Osseous demineralization.  Tricompartmental joint space narrowing and spur formation. Scattered chondrocalcinosis  question CPPD. Small joint effusion. No acute fracture, dislocation, or bone destruction. Atherosclerotic calcifications at runoff vessels. IMPRESSION: Advanced tricompartmental osteoarthritic changes and suspect CPPD of RIGHT knee. Osseous demineralization. Electronically Signed   By: Ulyses Southward M.D.   On: 03/31/2020 15:33    Vonzella Nipple, MD [facility] If 7PM-7AM, please contact night-coverage at [email or web address], Office  [phone]   04/02/2020, 10:28 AM  LOS: 16 days

## 2020-04-03 LAB — GLUCOSE, CAPILLARY
Glucose-Capillary: 96 mg/dL (ref 70–99)
Glucose-Capillary: 138 mg/dL — ABNORMAL HIGH (ref 70–99)
Glucose-Capillary: 117 mg/dL — ABNORMAL HIGH (ref 70–99)
Glucose-Capillary: 101 mg/dL — ABNORMAL HIGH (ref 70–99)

## 2020-04-03 LAB — SEDIMENTATION RATE: Sed Rate: 125 mm/hr — ABNORMAL HIGH (ref 0–22)

## 2020-04-03 LAB — MAGNESIUM: Magnesium: 1.9 mg/dL (ref 1.7–2.4)

## 2020-04-03 LAB — HEMOGLOBIN AND HEMATOCRIT, BLOOD
HCT: 22.8 % — ABNORMAL LOW (ref 36.0–46.0)
Hemoglobin: 7.2 g/dL — ABNORMAL LOW (ref 12.0–15.0)

## 2020-04-03 LAB — CBC
HCT: 23.2 % — ABNORMAL LOW (ref 36.0–46.0)
Hemoglobin: 7.1 g/dL — ABNORMAL LOW (ref 12.0–15.0)
MCH: 25.4 pg — ABNORMAL LOW (ref 26.0–34.0)
MCHC: 30.6 g/dL (ref 30.0–36.0)
MCV: 83.2 fL (ref 80.0–100.0)
Platelets: 760 10*3/uL — ABNORMAL HIGH (ref 150–400)
RBC: 2.79 MIL/uL — ABNORMAL LOW (ref 3.87–5.11)
RDW: 18.3 % — ABNORMAL HIGH (ref 11.5–15.5)
WBC: 7.2 10*3/uL (ref 4.0–10.5)
nRBC: 0.3 % — ABNORMAL HIGH (ref 0.0–0.2)

## 2020-04-03 LAB — BASIC METABOLIC PANEL
Anion gap: 9 (ref 5–15)
BUN: 18 mg/dL (ref 8–23)
CO2: 25 mmol/L (ref 22–32)
Calcium: 8.5 mg/dL — ABNORMAL LOW (ref 8.9–10.3)
Chloride: 106 mmol/L (ref 98–111)
Creatinine, Ser: 1.06 mg/dL — ABNORMAL HIGH (ref 0.44–1.00)
GFR, Estimated: 55 mL/min — ABNORMAL LOW (ref >60)
Glucose, Bld: 166 mg/dL — ABNORMAL HIGH (ref 70–99)
Potassium: 4 mmol/L (ref 3.5–5.1)
Sodium: 140 mmol/L (ref 135–145)

## 2020-04-03 LAB — HEPATITIS C ANTIBODY: HCV Ab: NONREACTIVE

## 2020-04-03 LAB — C-REACTIVE PROTEIN: CRP: 15.2 mg/dL — ABNORMAL HIGH (ref <1.0)

## 2020-04-03 MED ORDER — DICLOFENAC SODIUM 1 % EX GEL
2.0000 g | Freq: Four times a day (QID) | CUTANEOUS | Status: DC | PRN
  Administered 2020-04-04: 2 g via TOPICAL
  Filled 2020-04-03: qty 100

## 2020-04-03 NOTE — Plan of Care (Signed)
Patient has poor mobility, improved arm and hand movement.   Problem: Education: Goal: Knowledge of General Education information will improve Description: Including pain rating scale, medication(s)/side effects and non-pharmacologic comfort measures Outcome: Not Progressing   Problem: Health Behavior/Discharge Planning: Goal: Ability to manage health-related needs will improve Outcome: Not Progressing   Problem: Clinical Measurements: Goal: Ability to maintain clinical measurements within normal limits will improve Outcome: Not Progressing Goal: Will remain free from infection Outcome: Not Progressing Goal: Diagnostic test results will improve Outcome: Not Progressing Goal: Respiratory complications will improve Outcome: Not Progressing Goal: Cardiovascular complication will be avoided Outcome: Not Progressing   Problem: Activity: Goal: Risk for activity intolerance will decrease Outcome: Not Progressing   Problem: Nutrition: Goal: Adequate nutrition will be maintained Outcome: Not Progressing   Problem: Coping: Goal: Level of anxiety will decrease Outcome: Not Progressing   Problem: Elimination: Goal: Will not experience complications related to bowel motility Outcome: Not Progressing Goal: Will not experience complications related to urinary retention Outcome: Not Progressing   Problem: Pain Managment: Goal: General experience of comfort will improve Outcome: Not Progressing   Problem: Safety: Goal: Ability to remain free from injury will improve Outcome: Not Progressing   Problem: Skin Integrity: Goal: Risk for impaired skin integrity will decrease Outcome: Not Progressing   Problem: Urinary Elimination: Goal: Signs and symptoms of infection will decrease Outcome: Not Progressing

## 2020-04-03 NOTE — Progress Notes (Signed)
PROGRESS NOTE    [name] [name]  [id number] DOB: September 13, 1945 DOA: 03/16/2020 PCP: Hoyt Koch, MD  No chief complaint on file.  Brief Narrative: 74 year old female with [name] [name] history of diabetes mellitus type 2, chronic kidney disease stage IIIa, CHF, chronic lymphedema, hypertension, osteoarthritis was brought to ED by EMS.  Daughter reported that she has not been able to get in touch with her mom for past couple of days so she called the police due to safety check on her and found her lying on the floor covered with stool and bugs crawling all over her.  She was seen by her PCP for intractable nausea vomiting for 4 to 6 weeks, associated with profound weakness and was also treated for UTI.  She was admitted with acute metabolic encephalopathy. CT head showed no acute abnormality.  Her mental status had gradually improved.  She underwent EGD on 03/23/2020 which showed reflux esophagitis, multiple large and small nonbleeding duodenal ulcers.  Assessment & Plan:   Principal Problem:   UTI (urinary tract infection) Active Problems:   HTN (hypertension)   DM type 2 (diabetes mellitus, type 2) (HCC)   Fever   Severe sepsis without septic shock (HCC)   Hypothermia   Acute kidney injury superimposed on chronic kidney disease (HCC)   Delirium   Protein-calorie malnutrition, severe   Duodenal ulcer   Acute esophagitis   Wernicke's aphasia   Polyarthralgia  1. Acute metabolic encephalopathy- multifactorial from acute kidney injury, dehydration, hyponatremia also concern for Wernicke's encephalopathy.  She was initially placed on IV ceftriaxone due to concern for UTI but cultures only grew 20,000 colonies of Enterococcus.  She received 3 days of intravenous antibiotics followed by [name] [name] dose of fosfomycin on 03/19/2020.  CT head, ammonia, TSH, B12, cortisol were unremarkable.  MRI brain showed findings suggestive of Wernicke's encephalopathy.  B1 level was low.  Currently patient is on  thiamine supplementation 100 mg po daily.  She did receive high-dose IV thiamine for 10 days in the hospital.  2. Fever-patient was supposed to discharge to skilled nursing facility on 03/26/2020 however she developed fever.  She did have [name] [name] UTI with Enterococcus which was treated with fosfomycin.  Urinalysis showed rare bacteria and moderate leukocytes.  Another potential source could be from sacral ulcers though they look quite superficial.  MRI pelvis did not show deep abscess however raised the possibility of underlying cellulitis with local lymphadenopathy.  She was started on doxycycline 03/28/2020. Initially had fever improved and ID was consulted.  She was found to have swelling and warmth of right elbow and right knee, x-ray of the joint shows polyarticular pseudogout.  Patient started on colchicine.         -Being followed by infectious disease.  She is off antibiotics at this time. She is continuing to complain of some pain in her joints. Per ID likely polyarticular pseudogout,  continue with supportive care/anti-inflammatory.  Low suspicion for infectious syndrome.  3. Polyarticular pseudogout-confirmed on x-ray of right elbow and knee.  Started on anti-inflammatory medication, colchicine 0.6 mg daily, if no improvement consider arthrocentesis to send for crystal analysis, cell count/culture.  ID following. 1. Elevated inflammatory markers  4. Chronic nausea and vomiting/dysphagia-unclear etiology, GI was consulted, patient underwent EGD on 03/23/2020 which showed grade C reflux esophagitis in the distal one third of the esophagus, no stricture noted.  Also showed multiple large and small nonbleeding duodenal ulcers with pigmented material in the bulb and postbulbar area.  Continue PPI, Carafate.  Avoid NSAIDs.  Speech therapy saw the patient and recommended dysphagia 2 diet with thin liquids.  5. Acute kidney injury on CKD stage IIIa-baseline creatinine around 1.5, improved with IV  fluids.  6. Hypernatremia-secondary to dehydration, received IV fluids and sodium.  Improved.  7. Hypokalemia, hypomagnesemia, hypophosphatemia- improved, continue to follow  8. Anemia-hemoglobin today 7.1, relatively stable.  Will order labs in AM.  Monitor.  If the hemoglobin is dropping further may need to consider checking stool for occult blood.  9. Thrombocytosis-likely in setting of inflammation/infection.   DVT prophylaxis: hold lovenox, continue SCD Code Status: full  Family Communication: none at bedside Disposition:   Status is: Inpatient  Remains inpatient appropriate because:Inpatient level of care appropriate due to severity of illness    Dispo: The patient is from: Home              Anticipated d/c is to: SNF              Anticipated d/c date is: > 3 days              Patient currently is not medically stable to d/c.       Consultants:   ID  Urology  GI  Procedures:  03/23/20 impression - LA Grade C reflux esophagitis with no bleeding in the distal 1/3 of the esophagus; no stricture identifed. - Normal appearing stomach; antral biopsies done to rule out H. pylori by pathology. - Multiple large and small non-bleeding duodenal ulcers with pigmented material in the bulb and post-bulbar area. Recommendation Full liquid diet today. - No Ibuprofen, Naproxen, or other non-steroidal anti-inflammatory drugs for now. - Use Protonix (Pantoprazole) 40 mg IV BID x 3 days. - Use sucralfate suspension 1 gram PO QID daily. - Await pathology results. - May need Roizy Harold gastrin level checked off of PPI's in the future.  Antimicrobials:  Anti-infectives (From admission, onward)   Start     Dose/Rate Route Frequency Ordered Stop   03/28/20 1200  doxycycline (VIBRA-TABS) tablet 100 mg  Status:  Discontinued        100 mg Oral Every 12 hours 03/28/20 1101 04/01/20 1150   03/26/20 1500  cefTRIAXone (ROCEPHIN) 1 g in sodium chloride 0.9 % 100 mL IVPB  Status:  Discontinued         1 g 200 mL/hr over 30 Minutes Intravenous Every 24 hours 03/26/20 1410 03/27/20 1457   03/19/20 1300  fosfomycin (MONUROL) packet 3 g        3 g Oral  Once 03/19/20 1159 03/19/20 1415   03/18/20 0600  cefTRIAXone (ROCEPHIN) 1 g in sodium chloride 0.9 % 100 mL IVPB  Status:  Discontinued        1 g 200 mL/hr over 30 Minutes Intravenous Every 24 hours 03/17/20 0519 03/19/20 1159   03/17/20 0415  cefTRIAXone (ROCEPHIN) 1 g in sodium chloride 0.9 % 100 mL IVPB        1 g 200 mL/hr over 30 Minutes Intravenous  Once 03/17/20 0406 03/17/20 0448      Subjective: Complaining of diffuse joint pain  Objective: Vitals:   04/02/20 1430 04/02/20 1929 04/03/20 0413 04/03/20 1500  BP: (!) 105/58 (!) 91/53 105/61 100/63  Pulse: 95 78 84 99  Resp: 20 18 16 16   Temp: 99.6 F (37.6 C) 98.6 F (37 C) 98.5 F (36.9 C) 98.2 F (36.8 C)  TempSrc: Oral  Oral Oral  SpO2: 100% 99% 93% 99%  Weight:  Height:        Intake/Output Summary (Last 24 hours) at 04/03/2020 1622 Last data filed at 04/03/2020 0730 Gross per 24 hour  Intake 640 ml  Output --  Net 640 ml   Filed Weights   03/19/20 1100  Weight: 113.4 kg    Examination:  General exam: Appears calm and comfortable  Respiratory system: Clear to auscultation. Respiratory effort normal. Cardiovascular system: S1 & S2 heard, RRR.  Gastrointestinal system: Abdomen is nondistended, soft and nontender Central nervous system: Alert and oriented. No focal neurological deficits. Extremities: bilateral LEE, TTP to bilateral knees Skin: No rashes, lesions or ulcers Psychiatry: Judgement and insight appear normal. Mood & affect appropriate.     Data Reviewed: I have personally reviewed following labs and imaging studies  CBC: Recent Labs  Lab 03/29/20 0501 03/31/20 1001 04/01/20 0644 04/02/20 0047 04/03/20 0045  WBC 6.3 7.5 6.7 7.4 7.2  HGB 8.1* 8.4* 8.3* 7.3* 7.1*  HCT 26.2* 26.8* 27.2* 23.7* 23.2*  MCV 80.6 80.5 81.9 81.2  83.2  PLT 655* 835* 646* 779* 760*    Basic Metabolic Panel: Recent Labs  Lab 03/28/20 0103 03/31/20 1001 04/01/20 0644 04/02/20 0047 04/03/20 0045  NA 142 142 140 141 140  K 3.5 2.7* 3.4* 3.1* 4.0  CL 109 107 105 106 106  CO2 26 24 21* 24 25  GLUCOSE 105* 112* 110* 194* 166*  BUN 21 19 17 19 18   CREATININE 1.44* 1.01* 1.05* 1.10* 1.06*  CALCIUM 8.2* 8.5* 8.6* 8.5* 8.5*  MG  --   --  1.6*  --  1.9  PHOS  --   --  2.6  --   --     GFR: Estimated Creatinine Clearance: 64.5 mL/min (Atwell Mcdanel) (by C-G formula based on SCr of 1.06 mg/dL (H)).  Liver Function Tests: Recent Labs  Lab 03/28/20 0103 03/31/20 1001 04/01/20 0644  AST 26 34 34  ALT 18 37 33  ALKPHOS 99 133* 126  BILITOT 0.5 0.7 0.8  PROT 5.2* 5.7* 5.6*  ALBUMIN 1.6* 1.7* 1.7*    CBG: Recent Labs  Lab 04/02/20 1153 04/02/20 1613 04/02/20 2105 04/03/20 0753 04/03/20 1134  GLUCAP 119* 208* 73 96 138*     Recent Results (from the past 240 hour(s))  Resp Panel by RT-PCR (Flu Raney Koeppen&B, Covid) Nasopharyngeal Swab     Status: None   Collection Time: 03/26/20 11:19 AM   Specimen: Nasopharyngeal Swab; Nasopharyngeal(NP) swabs in vial transport medium  Result Value Ref Range Status   SARS Coronavirus 2 by RT PCR NEGATIVE NEGATIVE Final    Comment: (NOTE) SARS-CoV-2 target nucleic acids are NOT DETECTED.  The SARS-CoV-2 RNA is generally detectable in upper respiratory specimens during the acute phase of infection. The lowest concentration of SARS-CoV-2 viral copies this assay can detect is 138 copies/mL. Kayson Bullis negative result does not preclude SARS-Cov-2 infection and should not be used as the sole basis for treatment or other patient management decisions. Valori Hollenkamp negative result may occur with  improper specimen collection/handling, submission of specimen other than nasopharyngeal swab, presence of viral mutation(s) within the areas targeted by this assay, and inadequate number of viral copies(<138 copies/mL). Husayn Reim negative  result must be combined with clinical observations, patient history, and epidemiological information. The expected result is Negative.  Fact Sheet for Patients:  EntrepreneurPulse.com.au  Fact Sheet for Healthcare Providers:  IncredibleEmployment.be  This test is no t yet approved or cleared by the [place] FDA and  has been authorized for detection and/or  diagnosis of SARS-CoV-2 by FDA under an Emergency Use Authorization (EUA). This EUA will remain  in effect (meaning this test can be used) for the duration of the COVID-19 declaration under Section 564(b)(1) of the Act, 21 U.S.C.section 360bbb-3(b)(1), unless the authorization is terminated  or revoked sooner.       Influenza Jeanann Balinski by PCR NEGATIVE NEGATIVE Final   Influenza B by PCR NEGATIVE NEGATIVE Final    Comment: (NOTE) The Xpert Xpress SARS-CoV-2/FLU/RSV plus assay is intended as an aid in the diagnosis of influenza from Nasopharyngeal swab specimens and should not be used as Landen Knoedler sole basis for treatment. Nasal washings and aspirates are unacceptable for Xpert Xpress SARS-CoV-2/FLU/RSV testing.  Fact Sheet for Patients: EntrepreneurPulse.com.au  Fact Sheet for Healthcare Providers: IncredibleEmployment.be  This test is not yet approved or cleared by the [place] FDA and has been authorized for detection and/or diagnosis of SARS-CoV-2 by FDA under an Emergency Use Authorization (EUA). This EUA will remain in effect (meaning this test can be used) for the duration of the COVID-19 declaration under Section 564(b)(1) of the Act, 21 U.S.C. section 360bbb-3(b)(1), unless the authorization is terminated or revoked.  Performed at [facility], [place] [address] [place], [place] [address]   Culture, blood (Routine X 2) w Reflex to ID Panel     Status: None (Preliminary result)   Collection Time: 03/31/20  6:22 AM   Specimen: BLOOD RIGHT  FOREARM  Result Value Ref Range Status   Specimen Description BLOOD RIGHT FOREARM  Final   Special Requests   Final    BOTTLES DRAWN AEROBIC ONLY Blood Culture adequate volume   Culture   Final    NO GROWTH 3 DAYS Performed at [facility], [place] [address], [place], [place] [address]    Report Status PENDING  Incomplete  Culture, blood (Routine X 2) w Reflex to ID Panel     Status: None (Preliminary result)   Collection Time: 03/31/20  6:22 AM   Specimen: BLOOD RIGHT WRIST  Result Value Ref Range Status   Specimen Description BLOOD RIGHT WRIST  Final   Special Requests   Final    BOTTLES DRAWN AEROBIC ONLY Blood Culture results may not be optimal due to an inadequate volume of blood received in culture bottles   Culture   Final    NO GROWTH 3 DAYS Performed at [facility], [place] [address], [place],  [address]    Report Status PENDING  Incomplete         Radiology Studies: No results found.      Scheduled Meds: . (feeding supplement) PROSource Plus  30 mL Oral TID WC  . sodium chloride   Intravenous Once  . colchicine  0.6 mg Oral Daily  . enoxaparin (LOVENOX) injection  40 mg Subcutaneous Q24H  . feeding supplement  1 Container Oral TID BM  . free water  100 mL Oral Q4H  . insulin aspart  0-5 Units Subcutaneous QHS  . insulin aspart  0-9 Units Subcutaneous TID WC  . multivitamin with minerals  1 tablet Oral Daily  . pantoprazole  40 mg Oral BID  . polyethylene glycol  17 g Oral BID  . senna  2 tablet Oral Daily  . sucralfate  1 g Oral TID WC & HS  . thiamine  100 mg Oral Daily   Continuous Infusions:   LOS: 17 days    Time spent: over 30 min    Fayrene Helper,  MD [facility]   To contact the attending provider between 7A-7P or the covering provider during after hours 7P-7A, please log into the web site [email or web address] and access using universal [place] password for that web site. If you do not have the password, please call  the hospital operator.  04/03/2020, 4:22 PM

## 2020-04-03 NOTE — Progress Notes (Signed)
Occupational Therapy Treatment Patient Details Name: [name] [name] MRN: [id number] DOB: 04/23/1945 Today's Date: 04/03/2020    History of present illness Pt is a 74 y.o. female admitted 03/16/20 found down by EMS after daughter requested wellness check having not heard from her for a couple days. Workup for metabolic encephalopathy, ARF, dehydration. Pt with L hip pain, xray with moderate osteoarthritis. Head CT negative for acute abnormality; age-related atrophy. MRI suggestive of Wenicke encephalopathy. PMH includes CKD3, CHF, DM2, HTN, OA, chronic lymphedema, venous stasis ulcers. 12/21 Discharge cancelled pt spiked fever and HR elevated.   OT comments  Pt very limited by pain and not progressing towards OT goals this session. Provided written HEP. Performed at bed level for BLE and BUE and neck. Pt total A +2 for rolling and peri care at bed level post HEP. OT will continue to follow acutely.    Follow Up Recommendations  SNF    Equipment Recommendations  3 in 1 bedside commode;Wheelchair (measurements OT)    Recommendations for Other Services      Precautions / Restrictions Precautions Precautions: Fall Precaution Comments: peri area VERY painful, as well as knees       Mobility Bed Mobility Overal bed mobility: Needs Assistance Bed Mobility: Rolling Rolling: Max assist;+2 for safety/equipment;+2 for physical assistance         General bed mobility comments: Performed rolling to R and left side.  Pt with poor ability to assist and aid in rolling.  Focused on rolling to clean patient of urinary and fecal incontinence.  Skin very red and irritated with multiple areas of break down.  Transfers                 General transfer comment: unable this session    Balance                                           ADL either performed or assessed with clinical judgement   ADL Overall ADL's : Needs assistance/impaired     Grooming: Wash/dry  hands;Wash/dry face;Set up;Bed level       Lower Body Bathing: Total assistance;Bed level               Toileting- Clothing Manipulation and Hygiene: Total assistance;Bed level Toileting - Clothing Manipulation Details (indicate cue type and reason): max A +2 for rolling in bed for peri care. Pt i             Vision       Perception     Praxis      Cognition Arousal/Alertness: Awake/alert Behavior During Therapy: Flat affect Overall Cognitive Status: Impaired/Different from baseline Area of Impairment: Problem solving;Safety/judgement;Following commands                       Following Commands: Follows one step commands with increased time Safety/Judgement: Decreased awareness of deficits;Decreased awareness of safety   Problem Solving: Slow processing;Decreased initiation;Difficulty sequencing;Requires verbal cues;Requires tactile cues          Exercises Exercises: General Lower Extremity;Shoulder General Exercises - Lower Extremity Ankle Circles/Pumps: AROM;Strengthening;Both;10 reps;Supine;Other (comment) (HOB elevated) Quad Sets: AROM;Both;10 reps;Supine (HOB elevated) Gluteal Sets: AROM;Strengthening;5 reps;Supine;Other (comment) (HOB elevated) Heel Slides: Both;Supine;AAROM;5 reps;Other (comment) Hip ABduction/ADduction: Both;10 reps;AAROM;Supine Shoulder Exercises Shoulder Flexion: AROM;Both;5 reps;Supine;Other (comment) (HOB elevated) Elbow Flexion: AROM;Both;10 reps;Supine Elbow Extension: AROM;Both;10 reps;Supine Digit Composite Flexion: AROM;Both;10 reps;Supine  Other Exercises Other Exercises: Cervical rotation to R and L side x 10 reps in supine. Other Exercises: B shoulder elevation x 10 reps in supine. Other Exercises: Ankle circles B LEs x 10 reps.   Shoulder Instructions       General Comments extremely limited by pain for all aspects of BLE movement    Pertinent Vitals/ Pain       Pain Assessment: Faces Faces Pain Scale:  Hurts whole lot Pain Location: peri area and BLE Pain Descriptors / Indicators: Grimacing;Moaning;Guarding Pain Intervention(s): Limited activity within patient's tolerance;Monitored during session;Repositioned  Home Living                                          Prior Functioning/Environment              Frequency  Min 2X/week        Progress Toward Goals  OT Goals(current goals can now be found in the care plan section)  Progress towards OT goals: Not progressing toward goals - comment (limited by pain)  Acute Rehab OT Goals Patient Stated Goal: to decrease pain OT Goal Formulation: With patient Time For Goal Achievement: 04/17/20 Potential to Achieve Goals: Good ADL Goals Pt Will Perform Grooming: with min guard assist;sitting  Plan Discharge plan remains appropriate    Co-evaluation                 AM-PAC OT "6 Clicks" Daily Activity     Outcome Measure   Help from another person eating meals?: None Help from another person taking care of personal grooming?: A Little Help from another person toileting, which includes using toliet, bedpan, or urinal?: Total Help from another person bathing (including washing, rinsing, drying)?: A Lot Help from another person to put on and taking off regular upper body clothing?: A Lot Help from another person to put on and taking off regular lower body clothing?: Total 6 Click Score: 13    End of Session    OT Visit Diagnosis: Other abnormalities of gait and mobility (R26.89);Muscle weakness (generalized) (M62.81);History of falling (Z91.81);Pain;Other symptoms and signs involving cognitive function;Cognitive communication deficit (R41.841) Pain - Right/Left:  (Bilateral) Pain - part of body: Leg   Activity Tolerance Patient limited by fatigue;Patient limited by pain   Patient Left in bed;with call bell/phone within reach;with bed alarm set   Nurse Communication Mobility status        Time:  7353-2992 OT Time Calculation (min): 41 min  Charges: OT General Charges $OT Visit: 1 Visit OT Treatments $Self Care/Home Management : 8-22 mins $Therapeutic Activity: 8-22 mins $Therapeutic Exercise: 8-22 mins  Nyoka Cowden OTR/L Acute Rehabilitation Services Pager: [phone] Office: [phone]   Evern Bio [name] 04/03/2020, 10:55 AM

## 2020-04-04 DIAGNOSIS — N3000 Acute cystitis without hematuria: Secondary | ICD-10-CM | POA: Diagnosis not present

## 2020-04-04 DIAGNOSIS — R509 Fever, unspecified: Secondary | ICD-10-CM | POA: Diagnosis not present

## 2020-04-04 LAB — CBC WITH DIFFERENTIAL/PLATELET
Abs Immature Granulocytes: 0.79 10*3/uL — ABNORMAL HIGH (ref 0.00–0.07)
Basophils Absolute: 0.1 10*3/uL (ref 0.0–0.1)
Basophils Relative: 1 %
Eosinophils Absolute: 0.2 10*3/uL (ref 0.0–0.5)
Eosinophils Relative: 2 %
HCT: 22.6 % — ABNORMAL LOW (ref 36.0–46.0)
Hemoglobin: 7.3 g/dL — ABNORMAL LOW (ref 12.0–15.0)
Immature Granulocytes: 11 %
Lymphocytes Relative: 30 %
Lymphs Abs: 2.2 10*3/uL (ref 0.7–4.0)
MCH: 26.3 pg (ref 26.0–34.0)
MCHC: 32.3 g/dL (ref 30.0–36.0)
MCV: 81.3 fL (ref 80.0–100.0)
Monocytes Absolute: 0.7 10*3/uL (ref 0.1–1.0)
Monocytes Relative: 9 %
Neutro Abs: 3.5 10*3/uL (ref 1.7–7.7)
Neutrophils Relative %: 47 %
Platelets: 977 10*3/uL (ref 150–400)
RBC: 2.78 MIL/uL — ABNORMAL LOW (ref 3.87–5.11)
RDW: 18.3 % — ABNORMAL HIGH (ref 11.5–15.5)
Smear Review: INCREASED
WBC: 7.4 10*3/uL (ref 4.0–10.5)
nRBC: 0.3 % — ABNORMAL HIGH (ref 0.0–0.2)

## 2020-04-04 LAB — COMPREHENSIVE METABOLIC PANEL
ALT: 63 U/L — ABNORMAL HIGH (ref 0–44)
AST: 98 U/L — ABNORMAL HIGH (ref 15–41)
Albumin: 1.6 g/dL — ABNORMAL LOW (ref 3.5–5.0)
Alkaline Phosphatase: 118 U/L (ref 38–126)
Anion gap: 9 (ref 5–15)
BUN: 16 mg/dL (ref 8–23)
CO2: 25 mmol/L (ref 22–32)
Calcium: 8.7 mg/dL — ABNORMAL LOW (ref 8.9–10.3)
Chloride: 105 mmol/L (ref 98–111)
Creatinine, Ser: 0.98 mg/dL (ref 0.44–1.00)
GFR, Estimated: >60 mL/min (ref >60)
Glucose, Bld: 108 mg/dL — ABNORMAL HIGH (ref 70–99)
Potassium: 3.6 mmol/L (ref 3.5–5.1)
Sodium: 139 mmol/L (ref 135–145)
Total Bilirubin: 0.3 mg/dL (ref 0.3–1.2)
Total Protein: 5.7 g/dL — ABNORMAL LOW (ref 6.5–8.1)

## 2020-04-04 LAB — SYNOVIAL CELL COUNT + DIFF, W/ CRYSTALS
Eosinophils-Synovial: 0 % (ref 0–1)
Lymphocytes-Synovial Fld: 15 % (ref 0–20)
Monocyte-Macrophage-Synovial Fluid: 2 % — ABNORMAL LOW (ref 50–90)
Neutrophil, Synovial: 83 % — ABNORMAL HIGH (ref 0–25)
WBC, Synovial: 1635 /mm3 — ABNORMAL HIGH (ref 0–200)

## 2020-04-04 LAB — GLUCOSE, CAPILLARY
Glucose-Capillary: 102 mg/dL — ABNORMAL HIGH (ref 70–99)
Glucose-Capillary: 123 mg/dL — ABNORMAL HIGH (ref 70–99)
Glucose-Capillary: 103 mg/dL — ABNORMAL HIGH (ref 70–99)
Glucose-Capillary: 161 mg/dL — ABNORMAL HIGH (ref 70–99)

## 2020-04-04 LAB — C-REACTIVE PROTEIN: CRP: 7.9 mg/dL — ABNORMAL HIGH (ref <1.0)

## 2020-04-04 LAB — SEDIMENTATION RATE: Sed Rate: 131 mm/hr — ABNORMAL HIGH (ref 0–22)

## 2020-04-04 MED ORDER — METHYLPREDNISOLONE ACETATE 80 MG/ML IJ SUSP
80.0000 mg | Freq: Once | INTRAMUSCULAR | Status: AC
  Administered 2020-04-04: 80 mg via INTRA_ARTICULAR
  Filled 2020-04-04: qty 1

## 2020-04-04 MED ORDER — BUPIVACAINE HCL (PF) 0.5 % IJ SOLN
10.0000 mL | Freq: Once | INTRAMUSCULAR | Status: AC
  Administered 2020-04-04: 10 mL
  Filled 2020-04-04 (×2): qty 10

## 2020-04-04 MED ORDER — COLLAGENASE 250 UNIT/GM EX OINT
TOPICAL_OINTMENT | Freq: Every day | CUTANEOUS | Status: DC
  Filled 2020-04-04: qty 30

## 2020-04-04 NOTE — Progress Notes (Signed)
[facility] Infectious Diseases Follow Up Note  Patient Identification: Patient Name: [name] [name] MRN: [id number] [place] Date: 03/16/2020 10:39 PM Age: 74 y.o.Today's Date: 04/04/2020   Reason for Visit: Polyarticular gout and fever  Principal Problem:   UTI (urinary tract infection) Active Problems:   HTN (hypertension)   DM type 2 (diabetes mellitus, type 2) (HCC)   Fever   Severe sepsis without septic shock (HCC)   Hypothermia   Acute kidney injury superimposed on chronic kidney disease (HCC)   Delirium   Protein-calorie malnutrition, severe   Duodenal ulcer   Acute esophagitis   Wernicke's aphasia   Polyarthralgia  Antibiotics: None   Interval Events: Afebrile for more than 48 hrs, no leukocytosis, hemodynamically stable. HCV ab NR   Assessment Fevers - resolved  Polyarticular Pseudogout CKD Thrombocytosis  Recommendations Rheumatology evaluation would be helpful for management of her Pseudoogout Clinical exam is not concerning for septic joint. Hence, Would not start empiric antibiotics until synovial fluid analysis is suggestive of septic joint and she is not septic A Rt knee arthrocentesis has been done today. Will follow up cell count and cultures  Monitor trend of AST/ALT  Work up for thrombocytosis per primary ? Inflammatory arthritis   Rest of the management as per the primary team. Thank you for the consult. ____________________________________________________________________ Subjective patient seen and examined at the bedside. She is talking to her family member on phone. Pain in her joints seems to be the same. Not worsening. No signs of septic joints on exam   Vitals BP 132/76 (BP Location: Right Arm)   Pulse 90   Temp 98.3 F (36.8 C) (Oral)   Resp 19   Ht 5\' 10"  (1.778 m)   Wt 113.4 kg   SpO2 100%   BMI 35.87 kg/m    Obese lady, No distress, sitting up in bed Normocephalic;throat  clear Neck supple cv rrr no mrg Lungs normal respiratory effort; ctabl abd s/nt Ext chronic bilateral LE edema ?( h/o lymphedema per patient) with atrophic hypopigmentation  Tenderness in bilateral elbows, wrists, knees, shoudlers and ankles. No sign sof septic joint Neuro generalized weakness Psych alert/oriented, calm  Pertinent Microbiology Results for orders placed or performed during the hospital encounter of 03/16/20  Resp Panel by RT-PCR (Flu A&B, Covid) Nasopharyngeal Swab     Status: None   Collection Time: 03/16/20 11:13 PM   Specimen: Nasopharyngeal Swab; Nasopharyngeal(NP) swabs in vial transport medium  Result Value Ref Range Status   SARS Coronavirus 2 by RT PCR NEGATIVE NEGATIVE Final    Comment: (NOTE) SARS-CoV-2 target nucleic acids are NOT DETECTED.  The SARS-CoV-2 RNA is generally detectable in upper respiratory specimens during the acute phase of infection. The lowest concentration of SARS-CoV-2 viral copies this assay can detect is 138 copies/mL. A negative result does not preclude SARS-Cov-2 infection and should not be used as the sole basis for treatment or other patient management decisions. A negative result may occur with  improper specimen collection/handling, submission of specimen other than nasopharyngeal swab, presence of viral mutation(s) within the areas targeted by this assay, and inadequate number of viral copies(<138 copies/mL). A negative result must be combined with clinical observations, patient history, and epidemiological information. The expected result is Negative.  Fact Sheet for Patients:  EntrepreneurPulse.com.au  Fact Sheet for Healthcare Providers:  IncredibleEmployment.be  This test is no t yet approved or cleared by the [place] FDA and  has been authorized for detection and/or diagnosis of SARS-CoV-2 by FDA under an  Emergency Use Authorization (EUA). This EUA will remain  in effect  (meaning this test can be used) for the duration of the COVID-19 declaration under Section 564(b)(1) of the Act, 21 U.S.C.section 360bbb-3(b)(1), unless the authorization is terminated  or revoked sooner.       Influenza A by PCR NEGATIVE NEGATIVE Final   Influenza B by PCR NEGATIVE NEGATIVE Final    Comment: (NOTE) The Xpert Xpress SARS-CoV-2/FLU/RSV plus assay is intended as an aid in the diagnosis of influenza from Nasopharyngeal swab specimens and should not be used as a sole basis for treatment. Nasal washings and aspirates are unacceptable for Xpert Xpress SARS-CoV-2/FLU/RSV testing.  Fact Sheet for Patients: BloggerCourse.com  Fact Sheet for Healthcare Providers: SeriousBroker.it  This test is not yet approved or cleared by the [place] FDA and has been authorized for detection and/or diagnosis of SARS-CoV-2 by FDA under an Emergency Use Authorization (EUA). This EUA will remain in effect (meaning this test can be used) for the duration of the COVID-19 declaration under Section 564(b)(1) of the Act, 21 U.S.C. section 360bbb-3(b)(1), unless the authorization is terminated or revoked.  Performed at [facility], [address], [place], [place] [address]   Blood culture (routine x 2)     Status: None   Collection Time: 03/17/20  1:04 AM   Specimen: BLOOD  Result Value Ref Range Status   Specimen Description BLOOD RIGHT ANTECUBITAL  Final   Special Requests   Final    BOTTLES DRAWN AEROBIC AND ANAEROBIC Blood Culture results may not be optimal due to an inadequate volume of blood received in culture bottles   Culture   Final    NO GROWTH 5 DAYS Performed at [facility], [address], [place], [place] [address]    Report Status 03/22/2020 FINAL  Final  Blood culture (routine x 2)     Status: None   Collection Time: 03/17/20  2:07 AM   Specimen: BLOOD RIGHT HAND  Result Value Ref Range Status    Specimen Description BLOOD RIGHT HAND  Final   Special Requests   Final    BOTTLES DRAWN AEROBIC AND ANAEROBIC Blood Culture results may not be optimal due to an inadequate volume of blood received in culture bottles   Culture   Final    NO GROWTH 5 DAYS Performed at [facility], [address], [place], [place] [address]    Report Status 03/22/2020 FINAL  Final  Urine culture     Status: Abnormal   Collection Time: 03/17/20 11:50 AM   Specimen: Urine, Random  Result Value Ref Range Status   Specimen Description URINE, RANDOM  Final   Special Requests   Final    NONE Performed at [facility], [address] [place], [place] [address]    Culture 20,000 COLONIES/mL ENTEROCOCCUS FAECALIS (A)  Final   Report Status 03/19/2020 FINAL  Final   Organism ID, Bacteria ENTEROCOCCUS FAECALIS (A)  Final      Susceptibility   Enterococcus faecalis - MIC*    AMPICILLIN <=2 SENSITIVE Sensitive     NITROFURANTOIN <=16 SENSITIVE Sensitive     VANCOMYCIN 2 SENSITIVE Sensitive     * 20,000 COLONIES/mL ENTEROCOCCUS FAECALIS  Resp Panel by RT-PCR (Flu A&B, Covid) Nasopharyngeal Swab     Status: None   Collection Time: 03/26/20 11:19 AM   Specimen: Nasopharyngeal Swab; Nasopharyngeal(NP) swabs in vial transport medium  Result Value Ref Range Status   SARS  Coronavirus 2 by RT PCR NEGATIVE NEGATIVE Final    Comment: (NOTE) SARS-CoV-2 target nucleic acids are NOT DETECTED.  The SARS-CoV-2 RNA is generally detectable in upper respiratory specimens during the acute phase of infection. The lowest concentration of SARS-CoV-2 viral copies this assay can detect is 138 copies/mL. A negative result does not preclude SARS-Cov-2 infection and should not be used as the sole basis for treatment or other patient management decisions. A negative result may occur with  improper specimen collection/handling, submission of specimen other than nasopharyngeal swab, presence of viral mutation(s) within  the areas targeted by this assay, and inadequate number of viral copies(<138 copies/mL). A negative result must be combined with clinical observations, patient history, and epidemiological information. The expected result is Negative.  Fact Sheet for Patients:  EntrepreneurPulse.com.au  Fact Sheet for Healthcare Providers:  IncredibleEmployment.be  This test is no t yet approved or cleared by the [place] FDA and  has been authorized for detection and/or diagnosis of SARS-CoV-2 by FDA under an Emergency Use Authorization (EUA). This EUA will remain  in effect (meaning this test can be used) for the duration of the COVID-19 declaration under Section 564(b)(1) of the Act, 21 U.S.C.section 360bbb-3(b)(1), unless the authorization is terminated  or revoked sooner.       Influenza A by PCR NEGATIVE NEGATIVE Final   Influenza B by PCR NEGATIVE NEGATIVE Final    Comment: (NOTE) The Xpert Xpress SARS-CoV-2/FLU/RSV plus assay is intended as an aid in the diagnosis of influenza from Nasopharyngeal swab specimens and should not be used as a sole basis for treatment. Nasal washings and aspirates are unacceptable for Xpert Xpress SARS-CoV-2/FLU/RSV testing.  Fact Sheet for Patients: EntrepreneurPulse.com.au  Fact Sheet for Healthcare Providers: IncredibleEmployment.be  This test is not yet approved or cleared by the [place] FDA and has been authorized for detection and/or diagnosis of SARS-CoV-2 by FDA under an Emergency Use Authorization (EUA). This EUA will remain in effect (meaning this test can be used) for the duration of the COVID-19 declaration under Section 564(b)(1) of the Act, 21 U.S.C. section 360bbb-3(b)(1), unless the authorization is terminated or revoked.  Performed at [facility], [place] [address] [place], [place] [address]   Culture, blood (Routine X 2) w Reflex to ID Panel      Status: None (Preliminary result)   Collection Time: 03/31/20  6:22 AM   Specimen: BLOOD RIGHT FOREARM  Result Value Ref Range Status   Specimen Description BLOOD RIGHT FOREARM  Final   Special Requests   Final    BOTTLES DRAWN AEROBIC ONLY Blood Culture adequate volume   Culture   Final    NO GROWTH 4 DAYS Performed at [facility], [place] [address] [place], [place] [address]    Report Status PENDING  Incomplete  Culture, blood (Routine X 2) w Reflex to ID Panel     Status: None (Preliminary result)   Collection Time: 03/31/20  6:22 AM   Specimen: BLOOD RIGHT WRIST  Result Value Ref Range Status   Specimen Description BLOOD RIGHT WRIST  Final   Special Requests   Final    BOTTLES DRAWN AEROBIC ONLY Blood Culture results may not be optimal due to an inadequate volume of blood received in culture bottles   Culture   Final    NO GROWTH 4 DAYS Performed at [facility], [place] [address], [place],  [address]    Report Status PENDING  Incomplete  Pertinent Lab. CBC Latest Ref Rng & Units 04/04/2020 04/03/2020 04/03/2020  WBC 4.0 - 10.5 K/uL 7.4 - 7.2  Hemoglobin 12.0 - 15.0 g/dL 7.3(L) 7.2(L) 7.1(L)  Hematocrit 36.0 - 46.0 % 22.6(L) 22.8(L) 23.2(L)  Platelets 150 - 400 K/uL 977(HH) - 760(H)   CMP Latest Ref Rng & Units 04/04/2020 04/03/2020 04/02/2020  Glucose 70 - 99 mg/dL 108(H) 166(H) 194(H)  BUN 8 - 23 mg/dL 16 18 19   Creatinine 0.44 - 1.00 mg/dL 0.98 1.06(H) 1.10(H)  Sodium 135 - 145 mmol/L 139 140 141  Potassium 3.5 - 5.1 mmol/L 3.6 4.0 3.1(L)  Chloride 98 - 111 mmol/L 105 106 106  CO2 22 - 32 mmol/L 25 25 24   Calcium 8.9 - 10.3 mg/dL 8.7(L) 8.5(L) 8.5(L)  Total Protein 6.5 - 8.1 g/dL 5.7(L) - -  Total Bilirubin 0.3 - 1.2 mg/dL 0.3 - -  Alkaline Phos 38 - 126 U/L 118 - -  AST 15 - 41 U/L 98(H) - -  ALT 0 - 44 U/L 63(H) - -    Pertinent Imaging today Plain films and CT images have been personally visualized and interpreted; radiology reports have  been reviewed. Decision making incorporated into the Impression / Recommendations.  I have spent approx 30 minutes for this patient encounter including review of prior medical records with greater than 50% of time being face to face and coordination of their care.  Electronically signed by:   Rosiland Oz, MD Infectious Disease Physician [facility] Pager: [phone]

## 2020-04-04 NOTE — Progress Notes (Signed)
PROGRESS NOTE    [name] [name]  [id number] DOB: 22-May-1945 DOA: 03/16/2020 PCP: [name] Koch, MD  No chief complaint on file.  Brief Narrative: 74 year old female with [name] [name] history of diabetes mellitus type 2, chronic kidney disease stage IIIa, CHF, chronic lymphedema, hypertension, osteoarthritis was brought to ED by EMS.  Daughter reported that she has not been able to get in touch with her mom for past couple of days so she called the police due to safety check on her and found her lying on the floor covered with stool and bugs crawling all over her.  She was seen by her PCP for intractable nausea vomiting for 4 to 6 weeks, associated with profound weakness and was also treated for UTI.  She was admitted with acute metabolic encephalopathy. CT head showed no acute abnormality.  Her mental status had gradually improved.  She underwent EGD on 03/23/2020 which showed reflux esophagitis, multiple large and small nonbleeding duodenal ulcers.  Assessment & Plan:   Principal Problem:   UTI (urinary tract infection) Active Problems:   HTN (hypertension)   DM type 2 (diabetes mellitus, type 2) (HCC)   Fever   Severe sepsis without septic shock (HCC)   Hypothermia   Acute kidney injury superimposed on chronic kidney disease (HCC)   Delirium   Protein-calorie malnutrition, severe   Duodenal ulcer   Acute esophagitis   Wernicke's aphasia   Polyarthralgia  1. Acute metabolic encephalopathy- multifactorial from acute kidney injury, dehydration, hyponatremia also concern for Wernicke's encephalopathy.  She was initially placed on IV ceftriaxone due to concern for UTI but cultures only grew 20,000 colonies of Enterococcus.  She received 3 days of intravenous antibiotics followed by [name] [name] dose of fosfomycin on 03/19/2020.  CT head, ammonia, TSH, B12, cortisol were unremarkable.  MRI brain showed findings suggestive of Wernicke's encephalopathy.  B1 level was low.  Currently patient is on  thiamine supplementation 100 mg po daily.  She did receive high-dose IV thiamine for 10 days in the hospital.  Improved.  2. Fever-patient was supposed to discharge to skilled nursing facility on 03/26/2020 however she developed fever.  She did have [name] Charlie UTI with Enterococcus which was treated with fosfomycin.  Urinalysis showed rare bacteria and moderate leukocytes.  Another potential source could be from sacral ulcers though they look quite superficial.  MRI pelvis did not show deep abscess however raised the possibility of underlying cellulitis with local lymphadenopathy.  She was started on doxycycline 03/28/2020. Initially had fever improved and ID was consulted.  She was found to have swelling and warmth of right elbow and right knee, x-ray of the joint shows polyarticular pseudogout.  Patient started on colchicine.         -Being followed by infectious disease.  She is off antibiotics at this time. She is continuing to complain of some pain in her joints. Per ID likely polyarticular pseudogout,  continue with supportive care/anti-inflammatory.  Low suspicion for infectious syndrome.  Will follow arthrocentesis prior to steroids for polyarticular pseudogout.  3. Polyarticular pseudogout-confirmed on x-ray of bilateral knees.  Started on anti-inflammatory medication, colchicine 0.6 mg daily.  ID following. 1. Elevated inflammatory markers 2. Given polyarticular involvement and continued pain on colchicine, will plan for systemic steroids - given previous fever, will follow arthrocentesis first   4. Chronic nausea and vomiting/dysphagia-unclear etiology, GI was consulted, patient underwent EGD on 03/23/2020 which showed grade C reflux esophagitis in the distal one third of the esophagus, no stricture noted.  Also showed  multiple large and small nonbleeding duodenal ulcers with pigmented material in the bulb and postbulbar area.  Continue PPI, Carafate.  Avoid NSAIDs.  Speech therapy saw the patient and  recommended dysphagia 2 diet with thin liquids.  5. Acute kidney injury on CKD stage IIIa-baseline creatinine around 1.5, improved with IV fluids.  6. Hypernatremia-secondary to dehydration, received IV fluids and sodium.  Improved.  7. Hypokalemia, hypomagnesemia, hypophosphatemia- improved, continue to follow  8. Anemia- relatively stable today.  Continue to monitor  9. Moisture Associated Skin Damage  Intertriginous Dermatitis:  1. Wound care recs, appreciate assistance 2. Pt with diarrhea and incontinent of urine, will place foley and rectal tube for now - goal for this to be temporary <1-2 days  10. Thrombocytosis-likely in setting of inflammation/infection.   DVT prophylaxis: hold lovenox, continue SCD Code Status: full  Family Communication: none at bedside Disposition:   Status is: Inpatient  Remains inpatient appropriate because:Inpatient level of care appropriate due to severity of illness    Dispo: The patient is from: Home              Anticipated d/c is to: SNF              Anticipated d/c date is: > 3 days              Patient currently is not medically stable to d/c.       Consultants:   ID  Urology  GI  Procedures:  03/23/20 impression - LA Grade C reflux esophagitis with no bleeding in the distal 1/3 of the esophagus; no stricture identifed. - Normal appearing stomach; antral biopsies done to rule out H. pylori by pathology. - Multiple large and small non-bleeding duodenal ulcers with pigmented material in the bulb and post-bulbar area. Recommendation Full liquid diet today. - No Ibuprofen, Naproxen, or other non-steroidal anti-inflammatory drugs for now. - Use Protonix (Pantoprazole) 40 mg IV BID x 3 days. - Use sucralfate suspension 1 gram PO QID daily. - Await pathology results. - May need Binta [name] gastrin level checked off of PPI's in the future.  Antimicrobials:  Anti-infectives (From admission, onward)   Start     Dose/Rate Route Frequency  Ordered Stop   03/28/20 1200  doxycycline (VIBRA-TABS) tablet 100 mg  Status:  Discontinued        100 mg Oral Every 12 hours 03/28/20 1101 04/01/20 1150   03/26/20 1500  cefTRIAXone (ROCEPHIN) 1 g in sodium chloride 0.9 % 100 mL IVPB  Status:  Discontinued        1 g 200 mL/hr over 30 Minutes Intravenous Every 24 hours 03/26/20 1410 03/27/20 1457   03/19/20 1300  fosfomycin (MONUROL) packet 3 g        3 g Oral  Once 03/19/20 1159 03/19/20 1415   03/18/20 0600  cefTRIAXone (ROCEPHIN) 1 g in sodium chloride 0.9 % 100 mL IVPB  Status:  Discontinued        1 g 200 mL/hr over 30 Minutes Intravenous Every 24 hours 03/17/20 0519 03/19/20 1159   03/17/20 0415  cefTRIAXone (ROCEPHIN) 1 g in sodium chloride 0.9 % 100 mL IVPB        1 g 200 mL/hr over 30 Minutes Intravenous  Once 03/17/20 0406 03/17/20 0448      Subjective: Complaints of continued joint pain   Objective: Vitals:   04/03/20 1500 04/03/20 2111 04/04/20 0433 04/04/20 1404  BP: 100/63 (!) 118/58 132/76 102/69  Pulse: 99 93 90 92  Resp: 16 17 19 17   Temp: 98.2 F (36.8 C) 99.5 F (37.5 C) 98.3 F (36.8 C) 98.5 F (36.9 C)  TempSrc: Oral Oral Oral Oral  SpO2: 99% 95% 100% 97%  Weight:      Height:        Intake/Output Summary (Last 24 hours) at 04/04/2020 1920 Last data filed at 04/04/2020 0754 Gross per 24 hour  Intake 740 ml  Output 325 ml  Net 415 ml   Filed Weights   03/19/20 1100  Weight: 113.4 kg    Examination:  General: No acute distress. Cardiovascular: Heart sounds show Arlis Yale regular rate, and rhythm Lungs: Clear to auscultation bilaterally  Abdomen: Soft, nontender, nondistended Neurological: Alert and oriented 3. Moves all extremities 4. Cranial nerves II through XII grossly intact. Skin: yellow slough within gluteal cleft   Data Reviewed: I have personally reviewed following labs and imaging studies  CBC: Recent Labs  Lab 03/31/20 1001 04/01/20 0644 04/02/20 0047 04/03/20 0045  04/03/20 1909 04/04/20 0035  WBC 7.5 6.7 7.4 7.2  --  7.4  NEUTROABS  --   --   --   --   --  3.5  HGB 8.4* 8.3* 7.3* 7.1* 7.2* 7.3*  HCT 26.8* 27.2* 23.7* 23.2* 22.8* 22.6*  MCV 80.5 81.9 81.2 83.2  --  81.3  PLT 835* 646* 779* 760*  --  977*    Basic Metabolic Panel: Recent Labs  Lab 03/31/20 1001 04/01/20 0644 04/02/20 0047 04/03/20 0045 04/04/20 0035  NA 142 140 141 140 139  K 2.7* 3.4* 3.1* 4.0 3.6  CL 107 105 106 106 105  CO2 24 21* 24 25 25   GLUCOSE 112* 110* 194* 166* 108*  BUN 19 17 19 18 16   CREATININE 1.01* 1.05* 1.10* 1.06* 0.98  CALCIUM 8.5* 8.6* 8.5* 8.5* 8.7*  MG  --  1.6*  --  1.9  --   PHOS  --  2.6  --   --   --     GFR: Estimated Creatinine Clearance: 69.8 mL/min (by C-G formula based on SCr of 0.98 mg/dL).  Liver Function Tests: Recent Labs  Lab 03/31/20 1001 04/01/20 0644 04/04/20 0035  AST 34 34 98*  ALT 37 33 63*  ALKPHOS 133* 126 118  BILITOT 0.7 0.8 0.3  PROT 5.7* 5.6* 5.7*  ALBUMIN 1.7* 1.7* 1.6*    CBG: Recent Labs  Lab 04/03/20 1710 04/03/20 2113 04/04/20 0753 04/04/20 1156 04/04/20 1707  GLUCAP 117* 101* 102* 123* 103*     Recent Results (from the past 240 hour(s))  Resp Panel by RT-PCR (Flu Duff Pozzi&B, Covid) Nasopharyngeal Swab     Status: None   Collection Time: 03/26/20 11:19 AM   Specimen: Nasopharyngeal Swab; Nasopharyngeal(NP) swabs in vial transport medium  Result Value Ref Range Status   SARS Coronavirus 2 by RT PCR NEGATIVE NEGATIVE Final    Comment: (NOTE) SARS-CoV-2 target nucleic acids are NOT DETECTED.  The SARS-CoV-2 RNA is generally detectable in upper respiratory specimens during the acute phase of infection. The lowest concentration of SARS-CoV-2 viral copies this assay can detect is 138 copies/mL. Braydin Aloi negative result does not preclude SARS-Cov-2 infection and should not be used as the sole basis for treatment or other patient management decisions. Jeff Mccallum negative result may occur with  improper specimen  collection/handling, submission of specimen other than nasopharyngeal swab, presence of viral mutation(s) within the areas targeted by this assay, and inadequate number of viral copies(<138 copies/mL). Abou Sterkel negative result must be combined  with clinical observations, patient history, and epidemiological information. The expected result is Negative.  Fact Sheet for Patients:  EntrepreneurPulse.com.au  Fact Sheet for Healthcare Providers:  IncredibleEmployment.be  This test is no t yet approved or cleared by the [place] FDA and  has been authorized for detection and/or diagnosis of SARS-CoV-2 by FDA under an Emergency Use Authorization (EUA). This EUA will remain  in effect (meaning this test can be used) for the duration of the COVID-19 declaration under Section 564(b)(1) of the Act, 21 U.S.C.section 360bbb-3(b)(1), unless the authorization is terminated  or revoked sooner.       Influenza Lylah Lantis by PCR NEGATIVE NEGATIVE Final   Influenza B by PCR NEGATIVE NEGATIVE Final    Comment: (NOTE) The Xpert Xpress SARS-CoV-2/FLU/RSV plus assay is intended as an aid in the diagnosis of influenza from Nasopharyngeal swab specimens and should not be used as Candida Vetter sole basis for treatment. Nasal washings and aspirates are unacceptable for Xpert Xpress SARS-CoV-2/FLU/RSV testing.  Fact Sheet for Patients: EntrepreneurPulse.com.au  Fact Sheet for Healthcare Providers: IncredibleEmployment.be  This test is not yet approved or cleared by the [place] FDA and has been authorized for detection and/or diagnosis of SARS-CoV-2 by FDA under an Emergency Use Authorization (EUA). This EUA will remain in effect (meaning this test can be used) for the duration of the COVID-19 declaration under Section 564(b)(1) of the Act, 21 U.S.C. section 360bbb-3(b)(1), unless the authorization is terminated or revoked.  Performed at [facility], [place] [address], [place], [place] [address]   Culture, blood (Routine X 2) w Reflex to ID Panel     Status: None (Preliminary result)   Collection Time: 03/31/20  6:22 AM   Specimen: BLOOD RIGHT FOREARM  Result Value Ref Range Status   Specimen Description BLOOD RIGHT FOREARM  Final   Special Requests   Final    BOTTLES DRAWN AEROBIC ONLY Blood Culture adequate volume   Culture   Final    NO GROWTH 4 DAYS Performed at [facility], [place] [address], [place], [place] [address]    Report Status PENDING  Incomplete  Culture, blood (Routine X 2) w Reflex to ID Panel     Status: None (Preliminary result)   Collection Time: 03/31/20  6:22 AM   Specimen: BLOOD RIGHT WRIST  Result Value Ref Range Status   Specimen Description BLOOD RIGHT WRIST  Final   Special Requests   Final    BOTTLES DRAWN AEROBIC ONLY Blood Culture results may not be optimal due to an inadequate volume of blood received in culture bottles   Culture   Final    NO GROWTH 4 DAYS Performed at [facility], [place] [address] [place], [place] [address]    Report Status PENDING  Incomplete  Body fluid culture     Status: None (Preliminary result)   Collection Time: 04/04/20  4:00 PM   Specimen: Body Fluid  Result Value Ref Range Status   Specimen Description FLUID RIGHT KNEE  Final   Special Requests NONE  Final   Gram Stain   Final    RARE WBC PRESENT,BOTH PMN AND MONONUCLEAR NO ORGANISMS SEEN Performed at [facility], [address], [place],  [address]    Culture PENDING  Incomplete   Report Status PENDING  Incomplete         Radiology Studies: No results found.      Scheduled Meds: . (feeding supplement) PROSource Plus  30  mL Oral TID WC  . sodium chloride   Intravenous Once  . colchicine  0.6 mg Oral Daily  . collagenase   Topical Daily  . feeding supplement  1 Container Oral TID BM  . free water  100 mL Oral Q4H  . insulin aspart  0-5 Units Subcutaneous QHS   . insulin aspart  0-9 Units Subcutaneous TID WC  . multivitamin with minerals  1 tablet Oral Daily  . pantoprazole  40 mg Oral BID  . polyethylene glycol  17 g Oral BID  . senna  2 tablet Oral Daily  . sucralfate  1 g Oral TID WC & HS  . thiamine  100 mg Oral Daily   Continuous Infusions:   LOS: 18 days    Time spent: over 30 min    Fayrene Helper, MD [facility]   To contact the attending provider between 7A-7P or the covering provider during after hours 7P-7A, please log into the web site [email or web address] and access using universal [place] password for that web site. If you do not have the password, please call the hospital operator.  04/04/2020, 7:20 PM

## 2020-04-04 NOTE — Consult Note (Signed)
WOC Nurse Consult Note: Patient receiving care in [facility] [phone]. Reason for Consult: "worsening decub" Wound type: evolving MASD-ITD Pressure Injury POA: NA Measurement: 3 cm x 5 cm Wound bed: 100% yellow slough deep within the gluteal cleft and involving "kissing" buttock lesions Drainage (amount, consistency, odor) none Periwound: intact Dressing procedure/placement/frequency:  Apply Santyl to medial gluteal fold wound with yellow slough in a nickel thick layer. Cover with a saline moistened gauze, then dry gauze or ABD pad.  Change daily.  Today at the time of my assessment, there was a foam dressing only over the area. The patient is on a standard size bed with low air loss mattress.  Thank you for the consult.  Discussed plan of care with the patient and bedside nurse.  WOC nurse will not follow at this time.  Please re-consult the WOC team if needed.  Helmut Muster, RN, MSN, CWOCN, CNS-BC, pager [phone]

## 2020-04-04 NOTE — Procedures (Signed)
Procedure: Right knee aspiration and injection  Indication: Right knee effusion(s)  Surgeon: Charma Igo, PA-C  Assist: None  Anesthesia: Topical refrigerant  EBL: None  Complications: None  Findings: After risks/benefits explained patient desires to undergo procedure. Consent obtained and time out performed. The right knee was sterilely prepped and aspirated. 48ml clear pale yellow fluid obtained. Needle redirected subpatellar but no more fluid encountered. 80mg  depomedrol and 53ml 0.5% Marcaine instilled, flowed easily. Pt tolerated the procedure well.    11m, PA-C Orthopedic Surgery [phone]

## 2020-04-04 NOTE — TOC Progression Note (Signed)
Transition of Care [facility]) - Progression Note    Patient Details  Name: [name] [name] MRN: [id number] Date of Birth: 06/10/1945  Transition of Care [facility]) CM/SW Contact  [name] [name], [place] Phone Number: 04/04/2020, 4:33 PM  Clinical Narrative:     CSW started insurance auth with [employer]. TOC will follow.   Expected Discharge Plan: Skilled Nursing Facility Barriers to Discharge: Barriers Resolved  Expected Discharge Plan and Services Expected Discharge Plan: Skilled Nursing Facility In-house Referral: Clinical Social Work Discharge Planning Services: CM Consult   Living arrangements for the past 2 months: Single Family Home Expected Discharge Date: 03/26/20                                     Social Determinants of Health (SDOH) Interventions    Readmission Risk Interventions No flowsheet data found.  [name] [name], [name] [name], [name] [name] Clinical Social Worker [phone]

## 2020-04-04 NOTE — Consult Note (Signed)
Reason for Consult:Right knee effusion Referring Physician: Hulen Luster Time called: 1430 Time at bedside: [place]   [name] [name] is an 74 y.o. female.  HPI: [name] [name] was admitted 2+ weeks ago with acute metabolic encephalopathy. While she was here she began to c/o right knee and elbow pain. X-rays showed e/o pseudogout and she was treated appropriately but has not responded very well. Orthopedic surgery was consulted for arthrocentesis to confirm diagnosis.  Past Medical History:  Diagnosis Date  . Acute kidney injury ([place])   . Aortic stenosis, mild 11/17/2013  . Arthritis    "both knees, spine, back, fingers" (11/29/2013)  . CHF (congestive heart failure) ([place])   . Edema   . GERD (gastroesophageal reflux disease)   . Heart murmur   . History of diastolic dysfunction    Echo [id number] with diastolic dysfunction  . HTN (hypertension)   . Morbid obesity ([place])   . OA (osteoarthritis)   . PAC (premature atrial contraction)    per prior Holter  . Palpitations   . Pneumonia    "as a child"  . PVC's (premature ventricular contractions)    per prior Holter  . SVT (supraventricular tachycardia) ([place])   . Tachycardia    noted at 07/28/11 visit. Started on beta blocker. Possible atrial flutter vs long PR tachycardia/AVNRT  . Type II diabetes mellitus ([place])     Past Surgical History:  Procedure Laterality Date  . BIOPSY  03/23/2020   Procedure: BIOPSY;  Surgeon: Juanita Craver, MD;  Location: [facility] ENDOSCOPY;  Service: Endoscopy;;  . [place]  . CESAREAN SECTION  1985  . ESOPHAGOGASTRODUODENOSCOPY (EGD) WITH PROPOFOL N/A 03/23/2020   Procedure: ESOPHAGOGASTRODUODENOSCOPY (EGD) WITH PROPOFOL;  Surgeon: Juanita Craver, MD;  Location: [facility] ENDOSCOPY;  Service: Endoscopy;  Laterality: N/A;  . SUPRAVENTRICULAR TACHYCARDIA ABLATION  11/29/2013  . SUPRAVENTRICULAR TACHYCARDIA ABLATION N/A 11/29/2013   Procedure: SUPRAVENTRICULAR TACHYCARDIA ABLATION;  Surgeon: [place] Lance, MD;  Location:  [facility] CATH LAB;  Service: Cardiovascular;  Laterality: N/A;  . US ECHOCARDIOGRAPHY  07/25/2008   EF 55-60%  . VAGINAL DELIVERY      Family History  Problem Relation Age of Onset  . Alzheimer's disease Mother   . Lung cancer Mother   . COPD Father   . Diabetes Maternal Uncle   . Stroke Neg Hx   . Colon cancer Neg Hx   . Stomach cancer Neg Hx   . Esophageal cancer Neg Hx     Social History:  reports that she quit smoking about 34 years ago. Her smoking use included cigarettes. She has a 30.00 pack-year smoking history. She has never used smokeless tobacco. She reports that she does not drink alcohol and does not use drugs.  Allergies:  Allergies  Allergen Reactions  . Oxycodone Hcl Nausea And Vomiting  . Penicillins Itching    Has patient had a PCN reaction causing immediate rash, facial/tongue/throat swelling, SOB or lightheadedness with hypotension: No Has patient had a PCN reaction causing severe rash involving mucus membranes or skin necrosis: No Has patient had a PCN reaction that required hospitalization: No Has patient had a PCN reaction occurring within the last 10 years: No If all of the above answers are "NO", then may proceed with Cephalosporin use.    Medications: I have reviewed the patient's current medications.  Results for orders placed or performed during the hospital encounter of 03/16/20 (from the past 48 hour(s))  Glucose, capillary     Status: Abnormal   Collection Time:  04/02/20  4:13 PM  Result Value Ref Range   Glucose-Capillary 208 (H) 70 - 99 mg/dL    Comment: Glucose reference range applies only to samples taken after fasting for at least 8 hours.  Glucose, capillary     Status: None   Collection Time: 04/02/20  9:05 PM  Result Value Ref Range   Glucose-Capillary 73 70 - 99 mg/dL    Comment: Glucose reference range applies only to samples taken after fasting for at least 8 hours.  CBC     Status: Abnormal   Collection Time: 04/03/20 12:45 AM  Result  Value Ref Range   WBC 7.2 4.0 - 10.5 K/uL   RBC 2.79 (L) 3.87 - 5.11 MIL/uL   Hemoglobin 7.1 (L) 12.0 - 15.0 g/dL   HCT 23.2 (L) 36.0 - 46.0 %   MCV 83.2 80.0 - 100.0 fL   MCH 25.4 (L) 26.0 - 34.0 pg   MCHC 30.6 30.0 - 36.0 g/dL   RDW 18.3 (H) 11.5 - 15.5 %   Platelets 760 (H) 150 - 400 K/uL   nRBC 0.3 (H) 0.0 - 0.2 %    Comment: Performed at [facility], [place] [address] [place], [place] [id number]  Basic metabolic panel     Status: Abnormal   Collection Time: 04/03/20 12:45 AM  Result Value Ref Range   Sodium 140 135 - 145 mmol/L   Potassium 4.0 3.5 - 5.1 mmol/L   Chloride 106 98 - 111 mmol/L   CO2 25 22 - 32 mmol/L   Glucose, Bld 166 (H) 70 - 99 mg/dL    Comment: Glucose reference range applies only to samples taken after fasting for at least 8 hours.   BUN 18 8 - 23 mg/dL   Creatinine, Ser 1.06 (H) 0.44 - 1.00 mg/dL   Calcium 8.5 (L) 8.9 - 10.3 mg/dL   GFR, Estimated 55 (L) >60 mL/min    Comment: (NOTE) Calculated using the CKD-EPI Creatinine Equation (2021)    Anion gap 9 5 - 15    Comment: Performed at [facility] [address], [place], [place] [address]  Magnesium     Status: None   Collection Time: 04/03/20 12:45 AM  Result Value Ref Range   Magnesium 1.9 1.7 - 2.4 mg/dL    Comment: Performed at [facility] [address] [place], [place] [address]  Hepatitis C antibody     Status: None   Collection Time: 04/03/20 12:45 AM  Result Value Ref Range   HCV Ab NON REACTIVE NON REACTIVE    Comment: (NOTE) Nonreactive HCV antibody screen is consistent with no HCV infections,  unless recent infection is suspected or other evidence exists to indicate HCV infection.  Performed at [facility], [place] [address] [place], [place] [address]   Sedimentation rate     Status: Abnormal   Collection Time: 04/03/20 12:45 AM  Result Value Ref Range   Sed Rate 125 (H) 0 - 22 mm/hr    Comment: Performed at [facility] [address]  [place], [place] [address]  C-reactive protein     Status: Abnormal   Collection Time: 04/03/20 12:45 AM  Result Value Ref Range   CRP 15.2 (H) <1.0 mg/dL    Comment: Performed at [facility] [address], [place], [place] [address]  Glucose, capillary     Status: None   Collection Time: 04/03/20  7:53 AM  Result Value  Ref Range   Glucose-Capillary 96 70 - 99 mg/dL    Comment: Glucose reference range applies only to samples taken after fasting for at least 8 hours.  Glucose, capillary     Status: Abnormal   Collection Time: 04/03/20 11:34 AM  Result Value Ref Range   Glucose-Capillary 138 (H) 70 - 99 mg/dL    Comment: Glucose reference range applies only to samples taken after fasting for at least 8 hours.  Glucose, capillary     Status: Abnormal   Collection Time: 04/03/20  5:10 PM  Result Value Ref Range   Glucose-Capillary 117 (H) 70 - 99 mg/dL    Comment: Glucose reference range applies only to samples taken after fasting for at least 8 hours.  Hemoglobin and hematocrit, blood     Status: Abnormal   Collection Time: 04/03/20  7:09 PM  Result Value Ref Range   Hemoglobin 7.2 (L) 12.0 - 15.0 g/dL   HCT 22.8 (L) 36.0 - 46.0 %    Comment: Performed at [facility], [place] [address], [place], [place] [address]  Glucose, capillary     Status: Abnormal   Collection Time: 04/03/20  9:13 PM  Result Value Ref Range   Glucose-Capillary 101 (H) 70 - 99 mg/dL    Comment: Glucose reference range applies only to samples taken after fasting for at least 8 hours.  CBC with Differential/Platelet     Status: Abnormal   Collection Time: 04/04/20 12:35 AM  Result Value Ref Range   WBC 7.4 4.0 - 10.5 K/uL   RBC 2.78 (L) 3.87 - 5.11 MIL/uL   Hemoglobin 7.3 (L) 12.0 - 15.0 g/dL   HCT 22.6 (L) 36.0 - 46.0 %   MCV 81.3 80.0 - 100.0 fL   MCH 26.3 26.0 - 34.0 pg   MCHC 32.3 30.0 - 36.0 g/dL   RDW 18.3 (H) 11.5 - 15.5 %   Platelets 977 (HH) 150 - 400 K/uL    Comment: REPEATED TO  VERIFY THIS CRITICAL RESULT HAS VERIFIED AND BEEN CALLED TO L. ABIERA,RN BY TERRAN TYSOR ON 12 30 2021 AT 0131, AND HAS BEEN READ BACK.     nRBC 0.3 (H) 0.0 - 0.2 %   Neutrophils Relative % 47 %   Neutro Abs 3.5 1.7 - 7.7 K/uL   Lymphocytes Relative 30 %   Lymphs Abs 2.2 0.7 - 4.0 K/uL   Monocytes Relative 9 %   Monocytes Absolute 0.7 0.1 - 1.0 K/uL   Eosinophils Relative 2 %   Eosinophils Absolute 0.2 0.0 - 0.5 K/uL   Basophils Relative 1 %   Basophils Absolute 0.1 0.0 - 0.1 K/uL   WBC Morphology MILD LEFT SHIFT (1-5% METAS, OCC MYELO, OCC BANDS)    Smear Review PLATELETS APPEAR INCREASED    Immature Granulocytes 11 %   Abs Immature Granulocytes 0.79 (H) 0.00 - 0.07 K/uL    Comment: Performed at [facility] [address] [place], [place] [address]  Comprehensive metabolic panel     Status: Abnormal   Collection Time: 04/04/20 12:35 AM  Result Value Ref Range   Sodium 139 135 - 145 mmol/L   Potassium 3.6 3.5 - 5.1 mmol/L   Chloride 105 98 - 111 mmol/L   CO2 25 22 - 32 mmol/L   Glucose, Bld 108 (H) 70 - 99 mg/dL    Comment: Glucose reference range applies only to samples taken after fasting for at least 8 hours.   BUN 16 8 -  23 mg/dL   Creatinine, Ser 0.98 0.44 - 1.00 mg/dL   Calcium 8.7 (L) 8.9 - 10.3 mg/dL   Total Protein 5.7 (L) 6.5 - 8.1 g/dL   Albumin 1.6 (L) 3.5 - 5.0 g/dL   AST 98 (H) 15 - 41 U/L   ALT 63 (H) 0 - 44 U/L   Alkaline Phosphatase 118 38 - 126 U/L   Total Bilirubin 0.3 0.3 - 1.2 mg/dL   GFR, Estimated >60 >60 mL/min    Comment: (NOTE) Calculated using the CKD-EPI Creatinine Equation (2021)    Anion gap 9 5 - 15    Comment: Performed at [facility], [place] [address], [place], [place] [address]  C-reactive protein     Status: Abnormal   Collection Time: 04/04/20 12:35 AM  Result Value Ref Range   CRP 7.9 (H) <1.0 mg/dL    Comment: Performed at [facility] [address], [place], [place] [address]  Sedimentation rate     Status:  Abnormal   Collection Time: 04/04/20 12:35 AM  Result Value Ref Range   Sed Rate 131 (H) 0 - 22 mm/hr    Comment: Performed at [facility] [address], [place], [place] [address]  Glucose, capillary     Status: Abnormal   Collection Time: 04/04/20  7:53 AM  Result Value Ref Range   Glucose-Capillary 102 (H) 70 - 99 mg/dL    Comment: Glucose reference range applies only to samples taken after fasting for at least 8 hours.  Glucose, capillary     Status: Abnormal   Collection Time: 04/04/20 11:56 AM  Result Value Ref Range   Glucose-Capillary 123 (H) 70 - 99 mg/dL    Comment: Glucose reference range applies only to samples taken after fasting for at least 8 hours.    No results found.  Review of Systems  Constitutional: Negative for chills, diaphoresis and fever.  HENT: Negative for ear discharge, ear pain, hearing loss and tinnitus.   Eyes: Negative for photophobia and pain.  Respiratory: Negative for cough and shortness of breath.   Cardiovascular: Negative for chest pain.  Gastrointestinal: Negative for abdominal pain, nausea and vomiting.  Genitourinary: Negative for dysuria, flank pain, frequency and urgency.  Musculoskeletal: Positive for arthralgias (Right knee, right elbow). Negative for back pain, myalgias and neck pain.  Neurological: Negative for dizziness and headaches.  Hematological: Does not bruise/bleed easily.  Psychiatric/Behavioral: The patient is not nervous/anxious.    Blood pressure 102/69, pulse 92, temperature 98.5 F (36.9 C), temperature source Oral, resp. rate 17, height 5\' 10"  (1.778 m), weight 113.4 kg, SpO2 97 %. Physical Exam Constitutional:      General: She is not in acute distress.    Appearance: She is well-developed and well-nourished. She is not diaphoretic.  HENT:     Head: Normocephalic and atraumatic.  Eyes:     General: No scleral icterus.       Right eye: No discharge.        Left eye: No discharge.     Conjunctiva/sclera:  Conjunctivae normal.  Cardiovascular:     Rate and Rhythm: Normal rate and regular rhythm.  Pulmonary:     Effort: Pulmonary effort is normal. No respiratory distress.  Musculoskeletal:     Cervical back: Normal range of motion.     Comments: UEx shoulder, elbow, wrist, digits- no skin wounds, nontender, no instability, no blocks to motion  Sens  Ax/R/M/U intact  Mot   Ax/  R/ PIN/ M/ AIN/ U intact  Rad 2+  LLE No traumatic wounds, ecchymosis, or rash  Nontender  No knee or ankle effusion  Knee stable to varus/ valgus and anterior/posterior stress  Sens DPN, SPN, TN intact  Motor EHL, ext, flex, evers 5/5  DP 2+, PT 2+, No significant edema  Skin:    General: Skin is warm and dry.  Neurological:     Mental Status: She is alert.  Psychiatric:        Mood and Affect: Mood and affect normal.        Behavior: Behavior normal.     Assessment/Plan: Right elbow, knee pain -- Thought to be CPPD, asked to provide sample for confirmation. Will obtain sample from knee and inject at same time. Multiple medical problems including diabetes mellitus type 2, chronic kidney disease stage IIIa, CHF, chronic lymphedema, hypertension, and osteoarthritis -- per primary service    Freeman Caldron, PA-C Orthopedic Surgery [phone] 04/04/2020, 3:31 PM

## 2020-04-04 NOTE — Progress Notes (Signed)
CRITICAL VALUE ALERT  Critical Value:  Platelets 977  Date & Time Notied:04/04/20  0135  Provider Notified:M. Katherina Right, NP  Orders Received/Actions taken: No

## 2020-04-04 NOTE — Progress Notes (Signed)
Reviewed lab results confirming pseudogout with intracellular calcium pyrophosphate crystals.  White count was only 1600, no evidence for sepsis.  Okay for continued medical management of pseudogout.  Please call with additional questions.  Teryl Lucy, MD

## 2020-04-04 NOTE — Progress Notes (Signed)
Physical Therapy Treatment Patient Details Name: [name] [name] MRN: [id number] DOB: 06-Apr-1946 Today's Date: 04/04/2020    History of Present Illness Pt is a 73 y.o. female admitted 03/16/20 found down by EMS after daughter requested wellness check having not heard from her for a couple days. Workup for metabolic encephalopathy, ARF, dehydration. Pt with L hip pain, xray with moderate osteoarthritis. Head CT negative for acute abnormality; age-related atrophy. MRI suggestive of W[place]ke encephalopathy. PMH includes CKD3, CHF, DM2, HTN, OA, chronic lymphedema, venous stasis ulcers. 12/21 Discharge cancelled pt spiked fever and HR elevated. 12/30 R knee aspiration.    PT Comments    Pt reports fatigue from day (getting flexiseal and catheter, R knee aspiration), agreeable to bed-level exercises and repositioning only. Pt participated well in LE therapeutic exercise to maintain ROM and LE strength, with increased time. Pt position in egress to simulate being up in chair, with both UEs supported and LEs floated with pillows. PT continuing to recommend SNF level of care post-acutely.    Follow Up Recommendations  SNF;Supervision/Assistance - 24 hour     Equipment Recommendations  Rolling walker with 5" wheels;3in1 (PT);Wheelchair (measurements PT);Wheelchair cushion (measurements PT);Hospital bed    Recommendations for Other Services       Precautions / Restrictions Precautions Precautions: Fall Restrictions Weight Bearing Restrictions: No    Mobility  Bed Mobility               General bed mobility comments: repositioning only, pt declines EOB or OOB attempts  Transfers                    Ambulation/Gait                 Stairs             Wheelchair Mobility    Modified Rankin (Stroke Patients Only)       Balance                                            Cognition Arousal/Alertness: Awake/alert Behavior During Therapy:  WFL for tasks assessed/performed Overall Cognitive Status: Impaired/Different from baseline Area of Impairment: Problem solving;Safety/judgement;Following commands                       Following Commands: Follows one step commands with increased time Safety/Judgement: Decreased awareness of deficits;Decreased awareness of safety Awareness: Intellectual Problem Solving: Slow processing;Decreased initiation;Difficulty sequencing;Requires verbal cues;Requires tactile cues General Comments: Pt lacks insight into deficits, is self-limiting due to pain. While PT was ordering pt's dinner, pt states "are you going to eat too?"      Exercises General Exercises - Lower Extremity Ankle Circles/Pumps: AAROM;Both;20 reps;Supine Quad Sets: AAROM;Both;15 reps;Supine Heel Slides: AAROM;Both;10 reps;Supine Hip ABduction/ADduction: AAROM;Both;15 reps;Supine Shoulder Exercises Shoulder Flexion: AROM;Right;AAROM;Left;10 reps;Seated (egress)    General Comments        Pertinent Vitals/Pain Pain Assessment: Faces Faces Pain Scale: Hurts even more Pain Location: BLEs Pain Descriptors / Indicators: Grimacing;Moaning;Guarding Pain Intervention(s): Limited activity within patient's tolerance;Monitored during session;Repositioned    Home Living                      Prior Function            PT Goals (current goals can now be found in the care plan section) Acute Rehab PT Goals  PT Goal Formulation: With patient/family Time For Goal Achievement: 04/18/20 Potential to Achieve Goals: Fair Progress towards PT goals: Progressing toward goals    Frequency    Min 2X/week      PT Plan Current plan remains appropriate    Co-evaluation              AM-PAC PT "6 Clicks" Mobility   Outcome Measure  Help needed turning from your back to your side while in a flat bed without using bedrails?: Total Help needed moving from lying on your back to sitting on the side of a flat  bed without using bedrails?: Total Help needed moving to and from a bed to a chair (including a wheelchair)?: Total Help needed standing up from a chair using your arms (e.g., wheelchair or bedside chair)?: Total Help needed to walk in hospital room?: Total Help needed climbing 3-5 steps with a railing? : Total 6 Click Score: 6    End of Session   Activity Tolerance: Patient limited by pain Patient left: in bed;with call bell/phone within reach (no bed alarm on air bed) Nurse Communication: Mobility status PT Visit Diagnosis: Other abnormalities of gait and mobility (R26.89);Muscle weakness (generalized) (M62.81)     Time: [id number] PT Time Calculation (min) (ACUTE ONLY): 20 min  Charges:  $Therapeutic Exercise: 8-22 mins                     Stacie Glaze, PT Acute Rehabilitation Services Pager [phone]  Office [phone]   Louis Matte 04/04/2020, 5:41 PM

## 2020-04-05 LAB — COMPREHENSIVE METABOLIC PANEL
ALT: 60 U/L — ABNORMAL HIGH (ref 0–44)
AST: 75 U/L — ABNORMAL HIGH (ref 15–41)
Albumin: 1.8 g/dL — ABNORMAL LOW (ref 3.5–5.0)
Alkaline Phosphatase: 120 U/L (ref 38–126)
Anion gap: 11 (ref 5–15)
BUN: 16 mg/dL (ref 8–23)
CO2: 25 mmol/L (ref 22–32)
Calcium: 8.7 mg/dL — ABNORMAL LOW (ref 8.9–10.3)
Chloride: 105 mmol/L (ref 98–111)
Creatinine, Ser: 1.08 mg/dL — ABNORMAL HIGH (ref 0.44–1.00)
GFR, Estimated: 54 mL/min — ABNORMAL LOW (ref >60)
Glucose, Bld: 136 mg/dL — ABNORMAL HIGH (ref 70–99)
Potassium: 3.6 mmol/L (ref 3.5–5.1)
Sodium: 141 mmol/L (ref 135–145)
Total Bilirubin: 0.4 mg/dL (ref 0.3–1.2)
Total Protein: 6.1 g/dL — ABNORMAL LOW (ref 6.5–8.1)

## 2020-04-05 LAB — CBC WITH DIFFERENTIAL/PLATELET
Abs Immature Granulocytes: 0.73 10*3/uL — ABNORMAL HIGH (ref 0.00–0.07)
Basophils Absolute: 0.1 10*3/uL (ref 0.0–0.1)
Basophils Relative: 1 %
Eosinophils Absolute: 0.2 10*3/uL (ref 0.0–0.5)
Eosinophils Relative: 3 %
HCT: 26.7 % — ABNORMAL LOW (ref 36.0–46.0)
Hemoglobin: 8 g/dL — ABNORMAL LOW (ref 12.0–15.0)
Immature Granulocytes: 10 %
Lymphocytes Relative: 25 %
Lymphs Abs: 1.8 10*3/uL (ref 0.7–4.0)
MCH: 25.1 pg — ABNORMAL LOW (ref 26.0–34.0)
MCHC: 30 g/dL (ref 30.0–36.0)
MCV: 83.7 fL (ref 80.0–100.0)
Monocytes Absolute: 0.6 10*3/uL (ref 0.1–1.0)
Monocytes Relative: 8 %
Neutro Abs: 3.8 10*3/uL (ref 1.7–7.7)
Neutrophils Relative %: 53 %
Platelets: 903 10*3/uL (ref 150–400)
RBC: 3.19 MIL/uL — ABNORMAL LOW (ref 3.87–5.11)
RDW: 18 % — ABNORMAL HIGH (ref 11.5–15.5)
Smear Review: INCREASED
WBC: 7.3 10*3/uL (ref 4.0–10.5)
nRBC: 0.4 % — ABNORMAL HIGH (ref 0.0–0.2)

## 2020-04-05 LAB — CULTURE, BLOOD (ROUTINE X 2)
Culture: NO GROWTH
Culture: NO GROWTH
Special Requests: ADEQUATE

## 2020-04-05 LAB — GLUCOSE, CAPILLARY
Glucose-Capillary: 112 mg/dL — ABNORMAL HIGH (ref 70–99)
Glucose-Capillary: 143 mg/dL — ABNORMAL HIGH (ref 70–99)
Glucose-Capillary: 128 mg/dL — ABNORMAL HIGH (ref 70–99)
Glucose-Capillary: 207 mg/dL — ABNORMAL HIGH (ref 70–99)

## 2020-04-05 LAB — PATHOLOGIST SMEAR REVIEW

## 2020-04-05 LAB — MAGNESIUM: Magnesium: 1.7 mg/dL (ref 1.7–2.4)

## 2020-04-05 LAB — C-REACTIVE PROTEIN: CRP: 3.3 mg/dL — ABNORMAL HIGH (ref <1.0)

## 2020-04-05 LAB — PHOSPHORUS: Phosphorus: 2.7 mg/dL (ref 2.5–4.6)

## 2020-04-05 MED ORDER — CHLORHEXIDINE GLUCONATE CLOTH 2 % EX PADS
6.0000 | MEDICATED_PAD | Freq: Every day | CUTANEOUS | Status: DC
  Administered 2020-04-05 – 2020-04-06 (×2): 6 via TOPICAL

## 2020-04-05 MED ORDER — ENOXAPARIN SODIUM 40 MG/0.4ML ~~LOC~~ SOLN
40.0000 mg | SUBCUTANEOUS | Status: DC
  Administered 2020-04-05 – 2020-04-08 (×4): 40 mg via SUBCUTANEOUS
  Filled 2020-04-05 (×4): qty 0.4

## 2020-04-05 MED ORDER — METHYLPREDNISOLONE SODIUM SUCC 40 MG IJ SOLR
40.0000 mg | Freq: Once | INTRAMUSCULAR | Status: DC
  Filled 2020-04-05: qty 1

## 2020-04-05 MED ORDER — PREDNISONE 20 MG PO TABS
40.0000 mg | ORAL_TABLET | Freq: Every day | ORAL | Status: DC
  Administered 2020-04-06 – 2020-04-08 (×3): 40 mg via ORAL
  Filled 2020-04-05 (×3): qty 2

## 2020-04-05 MED ORDER — METHYLPREDNISOLONE SODIUM SUCC 40 MG IJ SOLR
40.0000 mg | Freq: Once | INTRAMUSCULAR | Status: AC
  Administered 2020-04-05: 40 mg via INTRAVENOUS
  Filled 2020-04-05: qty 1

## 2020-04-05 MED ORDER — GERHARDT'S BUTT CREAM
TOPICAL_CREAM | Freq: Every day | CUTANEOUS | Status: DC | PRN
  Filled 2020-04-05 (×2): qty 1

## 2020-04-05 MED ORDER — METHYLPREDNISOLONE SODIUM SUCC 125 MG IJ SOLR: 40.0000 mg | Freq: Once | INTRAMUSCULAR | Status: DC

## 2020-04-05 MED ORDER — PSYLLIUM 95 % PO PACK
1.0000 | PACK | Freq: Every day | ORAL | Status: DC
  Administered 2020-04-06 – 2020-04-07 (×2): 1 via ORAL
  Filled 2020-04-05 (×3): qty 1

## 2020-04-05 NOTE — TOC Progression Note (Signed)
Transition of Care [facility]) - Progression Note    Patient Details  Name: [name] [name] MRN: [id number] Date of Birth: 11-28-1945  Transition of [facility]) CM/SW Contact  Jimmy Picket, [place] Phone Number: 04/05/2020, 3:57 PM  Clinical Narrative:     Pts insurance [employer] has been approved through 04/12/19. Auth number [id number].  Expected Discharge Plan: Skilled Nursing Facility Barriers to Discharge: Barriers Resolved  Expected Discharge Plan and Services Expected Discharge Plan: Skilled Nursing Facility In-house Referral: Clinical Social Work Discharge Planning Services: CM Consult   Living arrangements for the past 2 months: Single Family Home Expected Discharge Date: 03/26/20                                     Social Determinants of Health (SDOH) Interventions    Readmission Risk Interventions No flowsheet data found.  Jimmy Picket, Theresia Majors, [place] Clinical Social Worker [phone]

## 2020-04-05 NOTE — Progress Notes (Signed)
PROGRESS NOTE    [name] [name]  [id number] DOB: 08/07/1945 DOA: 03/16/2020 PCP: Hoyt Koch, MD  No chief complaint on file.  Brief Narrative: 74 year old female with [name] [name] history of diabetes mellitus type 2, chronic kidney disease stage IIIa, CHF, chronic lymphedema, hypertension, osteoarthritis was brought to ED by EMS.  Daughter reported that she has not been able to get in touch with her mom for past couple of days so she called the police due to safety check on her and found her lying on the floor covered with stool and bugs crawling all over her.  She was seen by her PCP for intractable nausea vomiting for 4 to 6 weeks, associated with profound weakness and was also treated for UTI.  She was admitted with acute metabolic encephalopathy. CT head showed no acute abnormality.  Her mental status had gradually improved.  She underwent EGD on 03/23/2020 which showed reflux esophagitis, multiple large and small nonbleeding duodenal ulcers.  Assessment & Plan:   Principal Problem:   UTI (urinary tract infection) Active Problems:   HTN (hypertension)   DM type 2 (diabetes mellitus, type 2) (HCC)   Fever   Severe sepsis without septic shock (HCC)   Hypothermia   Acute kidney injury superimposed on chronic kidney disease (HCC)   Delirium   Protein-calorie malnutrition, severe   Duodenal ulcer   Acute esophagitis   Wernicke's aphasia   Polyarthralgia  1. Acute metabolic encephalopathy- multifactorial from acute kidney injury, dehydration, hyponatremia also concern for Wernicke's encephalopathy.  She was initially placed on IV ceftriaxone due to concern for UTI but cultures only grew 20,000 colonies of Enterococcus.  She received 3 days of intravenous antibiotics followed by [name] dose of fosfomycin on 03/19/2020.  CT head, ammonia, TSH, B12, cortisol were unremarkable.  MRI brain showed findings suggestive of Wernicke's encephalopathy.  B1 level was low.  Currently patient is on  thiamine supplementation 100 mg po daily.  She did receive high-dose IV thiamine for 10 days in the hospital.  Improved.  2. Fever-patient was supposed to discharge to skilled nursing facility on 03/26/2020 however she developed fever.  She did have [name] UTI with Enterococcus which was treated with fosfomycin.  Urinalysis showed rare bacteria and moderate leukocytes.  Another potential source could be from sacral ulcers though they look quite superficial.  MRI pelvis did not show deep abscess however raised the possibility of underlying cellulitis with local lymphadenopathy.  She was started on doxycycline 03/28/2020. Initially had fever improved and ID was consulted.  She was found to have swelling and warmth of right elbow and right knee, x-ray of the joint shows polyarticular pseudogout.  Patient started on colchicine.         -Being followed by infectious disease.  She is off antibiotics at this time. She is continuing to complain of some pain in her joints. Per ID likely polyarticular pseudogout,  continue with supportive care/anti-inflammatory.  Low suspicion for infectious syndrome.  Arthrocentesis with 16,35 WBC's, intracellular calcium pyrophosphate crystals.  No growth.  3. Polyarticular pseudogout-confirmed on x-ray of bilateral knees.  Started on anti-inflammatory medication, colchicine 0.6 mg daily.  ID following. 1. Elevated inflammatory markers 2. Given polyarticular involvement and continued pain on colchicine, will plan for systemic steroids -> arthrocentesis shows findings concerning for pseudogout.  Will start steroids, follow response.  4. Chronic nausea and vomiting/dysphagia-unclear etiology, GI was consulted, patient underwent EGD on 03/23/2020 which showed grade C reflux esophagitis in the distal one third of the esophagus,  no stricture noted.  Also showed multiple large and small nonbleeding duodenal ulcers with pigmented material in the bulb and postbulbar area.  Continue PPI,  Carafate.  Avoid NSAIDs.  Speech therapy saw the patient and recommended dysphagia 2 diet with thin liquids.  5. Acute kidney injury on CKD stage IIIa-baseline creatinine around 1.5, improved with IV fluids.  6. Hypernatremia-secondary to dehydration, received IV fluids and sodium.  Improved.  7. Hypokalemia, hypomagnesemia, hypophosphatemia- improved, continue to follow  8. Anemia- relatively stable today.  Continue to monitor  9. Moisture Associated Skin Damage  Intertriginous Dermatitis:  1. Wound care recs, appreciate assistance 2. Pt with diarrhea and incontinent of urine, will place foley and rectal tube for now -> follow for discontinuation   10. Thrombocytosis-likely in setting of inflammation/infection.   DVT prophylaxis: lovenox Code Status: full  Family Communication: none at bedside Disposition:   Status is: Inpatient  Remains inpatient appropriate because:Inpatient level of care appropriate due to severity of illness    Dispo: The patient is from: Home              Anticipated d/c is to: SNF              Anticipated d/c date is: > 3 days              Patient currently is not medically stable to d/c.       Consultants:   ID  Urology  GI  Procedures:  03/23/20 impression - LA Grade C reflux esophagitis with no bleeding in the distal 1/3 of the esophagus; no stricture identifed. - Normal appearing stomach; antral biopsies done to rule out H. pylori by pathology. - Multiple large and small non-bleeding duodenal ulcers with pigmented material in the bulb and post-bulbar area. Recommendation Full liquid diet today. - No Ibuprofen, Naproxen, or other non-steroidal anti-inflammatory drugs for now. - Use Protonix (Pantoprazole) 40 mg IV BID x 3 days. - Use sucralfate suspension 1 gram PO QID daily. - Await pathology results. - May need Pleshette Tomasini gastrin level checked off of PPI's in the future.  Antimicrobials:  Anti-infectives (From admission, onward)   Start      Dose/Rate Route Frequency Ordered Stop   03/28/20 1200  doxycycline (VIBRA-TABS) tablet 100 mg  Status:  Discontinued        100 mg Oral Every 12 hours 03/28/20 1101 04/01/20 1150   03/26/20 1500  cefTRIAXone (ROCEPHIN) 1 g in sodium chloride 0.9 % 100 mL IVPB  Status:  Discontinued        1 g 200 mL/hr over 30 Minutes Intravenous Every 24 hours 03/26/20 1410 03/27/20 1457   03/19/20 1300  fosfomycin (MONUROL) packet 3 g        3 g Oral  Once 03/19/20 1159 03/19/20 1415   03/18/20 0600  cefTRIAXone (ROCEPHIN) 1 g in sodium chloride 0.9 % 100 mL IVPB  Status:  Discontinued        1 g 200 mL/hr over 30 Minutes Intravenous Every 24 hours 03/17/20 0519 03/19/20 1159   03/17/20 0415  cefTRIAXone (ROCEPHIN) 1 g in sodium chloride 0.9 % 100 mL IVPB        1 g 200 mL/hr over 30 Minutes Intravenous  Once 03/17/20 0406 03/17/20 0448      Subjective: Complaints of joint pain  Objective: Vitals:   04/04/20 1404 04/04/20 2111 04/05/20 0541 04/05/20 1346  BP: 102/69 129/74 132/78 135/75  Pulse: 92 (!) 101 90 92  Resp: 17 18  17   Temp: 98.5 F (36.9 C) 98.4 F (36.9 C) 98 F (36.7 C) 98.3 F (36.8 C)  TempSrc: Oral Oral Oral Oral  SpO2: 97% 100% 98% 100%  Weight:      Height:        Intake/Output Summary (Last 24 hours) at 04/05/2020 1641 Last data filed at 04/05/2020 1419 Gross per 24 hour  Intake 480 ml  Output 1275 ml  Net -795 ml   Filed Weights   03/19/20 1100  Weight: 113.4 kg    Examination:  General: No acute distress. Cardiovascular: Heart sounds show Ndrew Creason regular rate, and rhythm.  Lungs: Clear to auscultation bilaterally  Abdomen: Soft, nontender, nondistended  Neurological: Alert and oriented 3. Moves all extremities 4. Cranial nerves II through XII grossly intact. Skin: wounds not examined today Extremities: No clubbing or cyanosis. No edema.   Data Reviewed: I have personally reviewed following labs and imaging studies  CBC: Recent Labs  Lab  04/01/20 0644 04/02/20 0047 04/03/20 0045 04/03/20 1909 04/04/20 0035 04/05/20 0124  WBC 6.7 7.4 7.2  --  7.4 7.3  NEUTROABS  --   --   --   --  3.5 3.8  HGB 8.3* 7.3* 7.1* 7.2* 7.3* 8.0*  HCT 27.2* 23.7* 23.2* 22.8* 22.6* 26.7*  MCV 81.9 81.2 83.2  --  81.3 83.7  PLT 646* 779* 760*  --  977* 903*    Basic Metabolic Panel: Recent Labs  Lab 04/01/20 0644 04/02/20 0047 04/03/20 0045 04/04/20 0035 04/05/20 0124  NA 140 141 140 139 141  K 3.4* 3.1* 4.0 3.6 3.6  CL 105 106 106 105 105  CO2 21* 24 25 25 25   GLUCOSE 110* 194* 166* 108* 136*  BUN 17 19 18 16 16   CREATININE 1.05* 1.10* 1.06* 0.98 1.08*  CALCIUM 8.6* 8.5* 8.5* 8.7* 8.7*  MG 1.6*  --  1.9  --  1.7  PHOS 2.6  --   --   --  2.7    GFR: Estimated Creatinine Clearance: 63.4 mL/min (Bedford Winsor) (by C-G formula based on SCr of 1.08 mg/dL (H)).  Liver Function Tests: Recent Labs  Lab 03/31/20 1001 04/01/20 0644 04/04/20 0035 04/05/20 0124  AST 34 34 98* 75*  ALT 37 33 63* 60*  ALKPHOS 133* 126 118 120  BILITOT 0.7 0.8 0.3 0.4  PROT 5.7* 5.6* 5.7* 6.1*  ALBUMIN 1.7* 1.7* 1.6* 1.8*    CBG: Recent Labs  Lab 04/04/20 1707 04/04/20 2109 04/05/20 0730 04/05/20 1206 04/05/20 1635  GLUCAP 103* 161* 112* 143* 128*     Recent Results (from the past 240 hour(s))  Culture, blood (Routine X 2) w Reflex to ID Panel     Status: None   Collection Time: 03/31/20  6:22 AM   Specimen: BLOOD RIGHT FOREARM  Result Value Ref Range Status   Specimen Description BLOOD RIGHT FOREARM  Final   Special Requests   Final    BOTTLES DRAWN AEROBIC ONLY Blood Culture adequate volume   Culture   Final    NO GROWTH 5 DAYS Performed at [facility], [address], [place], [address] [place]    Report Status 04/05/2020 FINAL  Final  Culture, blood (Routine X 2) w Reflex to ID Panel     Status: None   Collection Time: 03/31/20  6:22 AM   Specimen: BLOOD RIGHT WRIST  Result Value Ref Range Status   Specimen Description BLOOD  RIGHT WRIST  Final   Special Requests  Final    BOTTLES DRAWN AEROBIC ONLY Blood Culture results may not be optimal due to an inadequate volume of blood received in culture bottles   Culture   Final    NO GROWTH 5 DAYS Performed at [facility], [place] [address] [place], [place] [address]    Report Status 04/05/2020 FINAL  Final  Body fluid culture     Status: None (Preliminary result)   Collection Time: 04/04/20  4:00 PM   Specimen: Body Fluid  Result Value Ref Range Status   Specimen Description FLUID RIGHT KNEE  Final   Special Requests NONE  Final   Gram Stain   Final    RARE WBC PRESENT,BOTH PMN AND MONONUCLEAR NO ORGANISMS SEEN    Culture   Final    NO GROWTH < 24 HOURS Performed at [facility], [place] [address], [place], [place] [address]    Report Status PENDING  Incomplete         Radiology Studies: No results found.      Scheduled Meds: . (feeding supplement) PROSource Plus  30 mL Oral TID WC  . sodium chloride   Intravenous Once  . Chlorhexidine Gluconate Cloth  6 each Topical Daily  . colchicine  0.6 mg Oral Daily  . collagenase   Topical Daily  . enoxaparin (LOVENOX) injection  40 mg Subcutaneous Q24H  . feeding supplement  1 Container Oral TID BM  . free water  100 mL Oral Q4H  . insulin aspart  0-5 Units Subcutaneous QHS  . insulin aspart  0-9 Units Subcutaneous TID WC  . multivitamin with minerals  1 tablet Oral Daily  . pantoprazole  40 mg Oral BID  . [START ON 04/06/2020] predniSONE  40 mg Oral Q breakfast  . psyllium  1 packet Oral Daily  . sucralfate  1 g Oral TID WC & HS  . thiamine  100 mg Oral Daily   Continuous Infusions:   LOS: 19 days    Time spent: over 30 min    Fayrene Helper, MD [facility]   To contact the attending provider between 7A-7P or the covering provider during after hours 7P-7A, please log into the web site [email or web address] and access using universal [employer] password for that web site. If you  do not have the password, please call the hospital operator.  04/05/2020, 4:41 PM

## 2020-04-06 LAB — CBC WITH DIFFERENTIAL/PLATELET
Abs Immature Granulocytes: 0.58 10*3/uL — ABNORMAL HIGH (ref 0.00–0.07)
Basophils Absolute: 0 10*3/uL (ref 0.0–0.1)
Basophils Relative: 0 %
Eosinophils Absolute: 0 10*3/uL (ref 0.0–0.5)
Eosinophils Relative: 0 %
HCT: 25.9 % — ABNORMAL LOW (ref 36.0–46.0)
Hemoglobin: 7.9 g/dL — ABNORMAL LOW (ref 12.0–15.0)
Immature Granulocytes: 6 %
Lymphocytes Relative: 12 %
Lymphs Abs: 1.3 10*3/uL (ref 0.7–4.0)
MCH: 25.2 pg — ABNORMAL LOW (ref 26.0–34.0)
MCHC: 30.5 g/dL (ref 30.0–36.0)
MCV: 82.5 fL (ref 80.0–100.0)
Monocytes Absolute: 0.3 10*3/uL (ref 0.1–1.0)
Monocytes Relative: 3 %
Neutro Abs: 8.3 10*3/uL — ABNORMAL HIGH (ref 1.7–7.7)
Neutrophils Relative %: 79 %
Platelets: 924 10*3/uL (ref 150–400)
RBC: 3.14 MIL/uL — ABNORMAL LOW (ref 3.87–5.11)
RDW: 18.1 % — ABNORMAL HIGH (ref 11.5–15.5)
WBC: 10.5 10*3/uL (ref 4.0–10.5)
nRBC: 0 % (ref 0.0–0.2)

## 2020-04-06 LAB — VITAMIN B12: Vitamin B-12: 760 pg/mL (ref 180–914)

## 2020-04-06 LAB — GLUCOSE, CAPILLARY
Glucose-Capillary: 149 mg/dL — ABNORMAL HIGH (ref 70–99)
Glucose-Capillary: 127 mg/dL — ABNORMAL HIGH (ref 70–99)
Glucose-Capillary: 179 mg/dL — ABNORMAL HIGH (ref 70–99)
Glucose-Capillary: 219 mg/dL — ABNORMAL HIGH (ref 70–99)

## 2020-04-06 LAB — COMPREHENSIVE METABOLIC PANEL
ALT: 53 U/L — ABNORMAL HIGH (ref 0–44)
AST: 48 U/L — ABNORMAL HIGH (ref 15–41)
Albumin: 1.9 g/dL — ABNORMAL LOW (ref 3.5–5.0)
Alkaline Phosphatase: 105 U/L (ref 38–126)
Anion gap: 10 (ref 5–15)
BUN: 20 mg/dL (ref 8–23)
CO2: 25 mmol/L (ref 22–32)
Calcium: 8.6 mg/dL — ABNORMAL LOW (ref 8.9–10.3)
Chloride: 105 mmol/L (ref 98–111)
Creatinine, Ser: 0.92 mg/dL (ref 0.44–1.00)
GFR, Estimated: >60 mL/min (ref >60)
Glucose, Bld: 196 mg/dL — ABNORMAL HIGH (ref 70–99)
Potassium: 3.7 mmol/L (ref 3.5–5.1)
Sodium: 140 mmol/L (ref 135–145)
Total Bilirubin: 0.3 mg/dL (ref 0.3–1.2)
Total Protein: 5.9 g/dL — ABNORMAL LOW (ref 6.5–8.1)

## 2020-04-06 LAB — RESP PANEL BY RT-PCR (FLU A&B, COVID) ARPGX2
Influenza A by PCR: NEGATIVE
Influenza B by PCR: NEGATIVE
SARS Coronavirus 2 by RT PCR: NEGATIVE

## 2020-04-06 LAB — IRON AND TIBC
Iron: 53 ug/dL (ref 28–170)
Saturation Ratios: 28 % (ref 10.4–31.8)
TIBC: 188 ug/dL — ABNORMAL LOW (ref 250–450)
UIBC: 135 ug/dL

## 2020-04-06 LAB — C-REACTIVE PROTEIN: CRP: 1.4 mg/dL — ABNORMAL HIGH (ref <1.0)

## 2020-04-06 LAB — MAGNESIUM: Magnesium: 1.7 mg/dL (ref 1.7–2.4)

## 2020-04-06 LAB — FERRITIN: Ferritin: 282 ng/mL (ref 11–307)

## 2020-04-06 LAB — PHOSPHORUS: Phosphorus: 2.8 mg/dL (ref 2.5–4.6)

## 2020-04-06 LAB — HEMOGLOBIN A1C
Hgb A1c MFr Bld: 5.8 % — ABNORMAL HIGH (ref 4.8–5.6)
Mean Plasma Glucose: 119.76 mg/dL

## 2020-04-06 LAB — FOLATE: Folate: 5.8 ng/mL — ABNORMAL LOW (ref >5.9)

## 2020-04-06 MED ORDER — FOLIC ACID 1 MG PO TABS
1.0000 mg | ORAL_TABLET | Freq: Every day | ORAL | Status: DC
  Administered 2020-04-06 – 2020-04-08 (×3): 1 mg via ORAL
  Filled 2020-04-06 (×3): qty 1

## 2020-04-06 NOTE — Progress Notes (Signed)
PROGRESS NOTE    [name] [name]  [id number] DOB: 1945-11-17 DOA: 03/16/2020 PCP: Hoyt Koch, MD  No chief complaint on file.  Brief Narrative: 75 year old female with Hasnain Manheim history of diabetes mellitus type 2, chronic kidney disease stage IIIa, CHF, chronic lymphedema, hypertension, osteoarthritis was brought to ED by EMS.  Daughter reported that she has not been able to get in touch with her mom for past couple of days so she called the police due to safety check on her and found her lying on the floor covered with stool and bugs crawling all over her.  She was seen by her PCP for intractable nausea vomiting for 4 to 6 weeks, associated with profound weakness and was also treated for UTI.  She was admitted with acute metabolic encephalopathy. CT head showed no acute abnormality.  Her mental status had gradually improved.  She underwent EGD on 03/23/2020 which showed reflux esophagitis, multiple large and small nonbleeding duodenal ulcers.  Assessment & Plan:   Principal Problem:   UTI (urinary tract infection) Active Problems:   HTN (hypertension)   DM type 2 (diabetes mellitus, type 2) (HCC)   Fever   Severe sepsis without septic shock (HCC)   Hypothermia   Acute kidney injury superimposed on chronic kidney disease (HCC)   Delirium   Protein-calorie malnutrition, severe   Duodenal ulcer   Acute esophagitis   Wernicke's aphasia   Polyarthralgia  1. Acute metabolic encephalopathy- multifactorial from acute kidney injury, dehydration, hyponatremia also concern for Wernicke's encephalopathy.  She was initially placed on IV ceftriaxone due to concern for UTI but cultures only grew 20,000 colonies of Enterococcus.  She received 3 days of intravenous antibiotics followed by Gillie Crisci dose of fosfomycin on 03/19/2020.  CT head, ammonia, TSH, B12, cortisol were unremarkable.  MRI brain showed findings suggestive of Wernicke's encephalopathy.  B1 level was low.  Currently patient is on  thiamine supplementation 100 mg po daily.  She did receive high-dose IV thiamine for 10 days in the hospital.  Improved.  2. Fever-patient was supposed to discharge to skilled nursing facility on 03/26/2020 however she developed fever.  She did have Yassin Scales UTI with Enterococcus which was treated with fosfomycin.  Urinalysis showed rare bacteria and moderate leukocytes.  Another potential source could be from sacral ulcers though they look quite superficial.  MRI pelvis did not show deep abscess however raised the possibility of underlying cellulitis with local lymphadenopathy.  She was started on doxycycline 03/28/2020. Initially had fever improved and ID was consulted.  She was found to have swelling and warmth of right elbow and right knee, x-ray of the joint shows polyarticular pseudogout.  Patient started on colchicine.         -Being followed by infectious disease.  She is off antibiotics at this time. She is continuing to complain of some pain in her joints. Per ID likely polyarticular pseudogout,  continue with supportive care/anti-inflammatory.  Low suspicion for infectious syndrome.  Arthrocentesis with 16,35 WBC's, intracellular calcium pyrophosphate crystals.  No growth x2.  3. Polyarticular pseudogout-confirmed on x-ray of bilateral knees.  Started on anti-inflammatory medication, colchicine 0.6 mg daily.  ID following. 1. Elevated inflammatory markers 2. Given polyarticular involvement and continued pain on colchicine, will plan for systemic steroids -> arthrocentesis shows findings concerning for pseudogout.  Will start steroids, follow response, will need taper.  4. Chronic nausea and vomiting/dysphagia-unclear etiology, GI was consulted, patient underwent EGD on 03/23/2020 which showed grade C reflux esophagitis in the distal one  third of the esophagus, no stricture noted.  Also showed multiple large and small nonbleeding duodenal ulcers with pigmented material in the bulb and postbulbar area.   Continue PPI, Carafate.  Avoid NSAIDs.  Speech therapy saw the patient and recommended dysphagia 2 diet with thin liquids.  5. Acute kidney injury on CKD stage IIIa-baseline creatinine around 1.5, improved with IV fluids.  6. Hypernatremia-secondary to dehydration, received IV fluids and sodium.  Improved.  7. Hypokalemia, hypomagnesemia, hypophosphatemia- improved, continue to follow  8. Anemia  Folate Deficiency- relatively stable today.  Continue to monitor 1. Supplement folate  9. Moisture Associated Skin Damage  Intertriginous Dermatitis:  1. Wound care recs, appreciate assistance 2. Rectal tube and foley d/c'd  10. Thrombocytosis-likely in setting of inflammation/infection.  Normal iron studies.   DVT prophylaxis: lovenox Code Status: full  Family Communication: none at bedside Disposition:   Status is: Inpatient  Remains inpatient appropriate because:Inpatient level of care appropriate due to severity of illness    Dispo: The patient is from: Home              Anticipated d/c is to: SNF              Anticipated d/c date is: > 3 days              Patient currently is not medically stable to d/c.       Consultants:   ID  Urology  GI  Procedures:  03/23/20 impression - LA Grade C reflux esophagitis with no bleeding in the distal 1/3 of the esophagus; no stricture identifed. - Normal appearing stomach; antral biopsies done to rule out H. pylori by pathology. - Multiple large and small non-bleeding duodenal ulcers with pigmented material in the bulb and post-bulbar area. Recommendation Full liquid diet today. - No Ibuprofen, Naproxen, or other non-steroidal anti-inflammatory drugs for now. - Use Protonix (Pantoprazole) 40 mg IV BID x 3 days. - Use sucralfate suspension 1 gram PO QID daily. - Await pathology results. - May need Demonte Dobratz gastrin level checked off of PPI's in the future.  Antimicrobials:  Anti-infectives (From admission, onward)   Start      Dose/Rate Route Frequency Ordered Stop   03/28/20 1200  doxycycline (VIBRA-TABS) tablet 100 mg  Status:  Discontinued        100 mg Oral Every 12 hours 03/28/20 1101 04/01/20 1150   03/26/20 1500  cefTRIAXone (ROCEPHIN) 1 g in sodium chloride 0.9 % 100 mL IVPB  Status:  Discontinued        1 g 200 mL/hr over 30 Minutes Intravenous Every 24 hours 03/26/20 1410 03/27/20 1457   03/19/20 1300  fosfomycin (MONUROL) packet 3 g        3 g Oral  Once 03/19/20 1159 03/19/20 1415   03/18/20 0600  cefTRIAXone (ROCEPHIN) 1 g in sodium chloride 0.9 % 100 mL IVPB  Status:  Discontinued        1 g 200 mL/hr over 30 Minutes Intravenous Every 24 hours 03/17/20 0519 03/19/20 1159   03/17/20 0415  cefTRIAXone (ROCEPHIN) 1 g in sodium chloride 0.9 % 100 mL IVPB        1 g 200 mL/hr over 30 Minutes Intravenous  Once 03/17/20 0406 03/17/20 0448      Subjective: Continues to feel Delvonte Berenson bit better Still with some joint pain  Objective: Vitals:   04/05/20 1346 04/05/20 2149 04/06/20 0459 04/06/20 1456  BP: 135/75 133/80 117/71 125/72  Pulse: 92 98 89  89  Resp:  16 17 18   Temp: 98.3 F (36.8 C) 98.6 F (37 C) 98 F (36.7 C) 99.2 F (37.3 C)  TempSrc: Oral Oral Oral Oral  SpO2: 100% 97% 98% 99%  Weight:      Height:        Intake/Output Summary (Last 24 hours) at 04/06/2020 1800 Last data filed at 04/06/2020 1030 Gross per 24 hour  Intake 480 ml  Output 501 ml  Net -21 ml   Filed Weights   03/19/20 1100  Weight: 113.4 kg    Examination:  General: No acute distress. Cardiovascular: Heart sounds show Alyah Boehning regular rate, and rhythm.. Lungs: Clear to auscultation bilaterally . Abdomen: Soft, nontender, nondistended  Neurological: Alert and oriented 3. Moves all extremities 4. Cranial nerves II through XII grossly intact. Skin: stable wound between gluteal folds with yellow slough Extremities: No clubbing or cyanosis. No edema.   Data Reviewed: I have personally reviewed following labs and imaging  studies  CBC: Recent Labs  Lab 04/02/20 0047 04/03/20 0045 04/03/20 1909 04/04/20 0035 04/05/20 0124 04/06/20 0224  WBC 7.4 7.2  --  7.4 7.3 10.5  NEUTROABS  --   --   --  3.5 3.8 8.3*  HGB 7.3* 7.1* 7.2* 7.3* 8.0* 7.9*  HCT 23.7* 23.2* 22.8* 22.6* 26.7* 25.9*  MCV 81.2 83.2  --  81.3 83.7 82.5  PLT 779* 760*  --  977* 903* 924*    Basic Metabolic Panel: Recent Labs  Lab 04/01/20 0644 04/02/20 0047 04/03/20 0045 04/04/20 0035 04/05/20 0124 04/06/20 0224  NA 140 141 140 139 141 140  K 3.4* 3.1* 4.0 3.6 3.6 3.7  CL 105 106 106 105 105 105  CO2 21* 24 25 25 25 25   GLUCOSE 110* 194* 166* 108* 136* 196*  BUN 17 19 18 16 16 20   CREATININE 1.05* 1.10* 1.06* 0.98 1.08* 0.92  CALCIUM 8.6* 8.5* 8.5* 8.7* 8.7* 8.6*  MG 1.6*  --  1.9  --  1.7 1.7  PHOS 2.6  --   --   --  2.7 2.8    GFR: Estimated Creatinine Clearance: 74.4 mL/min (by C-G formula based on SCr of 0.92 mg/dL).  Liver Function Tests: Recent Labs  Lab 03/31/20 1001 04/01/20 0644 04/04/20 0035 04/05/20 0124 04/06/20 0224  AST 34 34 98* 75* 48*  ALT 37 33 63* 60* 53*  ALKPHOS 133* 126 118 120 105  BILITOT 0.7 0.8 0.3 0.4 0.3  PROT 5.7* 5.6* 5.7* 6.1* 5.9*  ALBUMIN 1.7* 1.7* 1.6* 1.8* 1.9*    CBG: Recent Labs  Lab 04/05/20 1635 04/05/20 2153 04/06/20 0754 04/06/20 1208 04/06/20 1545  GLUCAP 128* 207* 149* 127* 179*     Recent Results (from the past 240 hour(s))  Culture, blood (Routine X 2) w Reflex to ID Panel     Status: None   Collection Time: 03/31/20  6:22 AM   Specimen: BLOOD RIGHT FOREARM  Result Value Ref Range Status   Specimen Description BLOOD RIGHT FOREARM  Final   Special Requests   Final    BOTTLES DRAWN AEROBIC ONLY Blood Culture adequate volume   Culture   Final    NO GROWTH 5 DAYS Performed at [facility], [place] [address], [place], [place] [address]    Report Status 04/05/2020 FINAL  Final  Culture, blood (Routine X 2) w Reflex to ID Panel     Status: None    Collection Time: 03/31/20  6:22 AM  Specimen: BLOOD RIGHT WRIST  Result Value Ref Range Status   Specimen Description BLOOD RIGHT WRIST  Final   Special Requests   Final    BOTTLES DRAWN AEROBIC ONLY Blood Culture results may not be optimal due to an inadequate volume of blood received in culture bottles   Culture   Final    NO GROWTH 5 DAYS Performed at [facility], [place] [address], [place], [place] [address]    Report Status 04/05/2020 FINAL  Final  Body fluid culture     Status: None (Preliminary result)   Collection Time: 04/04/20  4:00 PM   Specimen: Body Fluid  Result Value Ref Range Status   Specimen Description FLUID RIGHT KNEE  Final   Special Requests NONE  Final   Gram Stain   Final    RARE WBC PRESENT,BOTH PMN AND MONONUCLEAR NO ORGANISMS SEEN    Culture   Final    NO GROWTH 2 DAYS Performed at [facility], [address] [place], [place] [address]    Report Status PENDING  Incomplete  Resp Panel by RT-PCR (Flu Oneika Simonian&B, Covid) Nasopharyngeal Swab     Status: None   Collection Time: 04/06/20 12:10 PM   Specimen: Nasopharyngeal Swab; Nasopharyngeal(NP) swabs in vial transport medium  Result Value Ref Range Status   SARS Coronavirus 2 by RT PCR NEGATIVE NEGATIVE Final    Comment: (NOTE) SARS-CoV-2 target nucleic acids are NOT DETECTED.  The SARS-CoV-2 RNA is generally detectable in upper respiratory specimens during the acute phase of infection. The lowest concentration of SARS-CoV-2 viral copies this assay can detect is 138 copies/mL. Japleen Tornow negative result does not preclude SARS-Cov-2 infection and should not be used as the sole basis for treatment or other patient management decisions. Tannia Contino negative result may occur with  improper specimen collection/handling, submission of specimen other than nasopharyngeal swab, presence of viral mutation(s) within the areas targeted by this assay, and inadequate number of viral copies(<138 copies/mL). Kavion [name] negative result  must be combined with clinical observations, patient history, and epidemiological information. The expected result is Negative.  Fact Sheet for Patients:  EntrepreneurPulse.com.au  Fact Sheet for Healthcare Providers:  IncredibleEmployment.be  This test is no t yet approved or cleared by the [place] FDA and  has been authorized for detection and/or diagnosis of SARS-CoV-2 by FDA under an Emergency Use Authorization (EUA). This EUA will remain  in effect (meaning this test can be used) for the duration of the COVID-19 declaration under Section 564(b)(1) of the Act, 21 U.S.C.section 360bbb-3(b)(1), unless the authorization is terminated  or revoked sooner.       Influenza Maan Zarcone by PCR NEGATIVE NEGATIVE Final   Influenza B by PCR NEGATIVE NEGATIVE Final    Comment: (NOTE) The Xpert Xpress SARS-CoV-2/FLU/RSV plus assay is intended as an aid in the diagnosis of influenza from Nasopharyngeal swab specimens and should not be used as Chelcey Caputo sole basis for treatment. Nasal washings and aspirates are unacceptable for Xpert Xpress SARS-CoV-2/FLU/RSV testing.  Fact Sheet for Patients: EntrepreneurPulse.com.au  Fact Sheet for Healthcare Providers: IncredibleEmployment.be  This test is not yet approved or cleared by the [place] FDA and has been authorized for detection and/or diagnosis of SARS-CoV-2 by FDA under an Emergency Use Authorization (EUA). This EUA will remain in effect (meaning this test can be used) for the duration of the COVID-19 declaration under Section 564(b)(1) of the Act, 21 U.S.C. section 360bbb-3(b)(1), unless the authorization is terminated or revoked.  Performed at  [facility], [place] [address], [place], [place] [address]          Radiology Studies: No results found.      Scheduled Meds: . (feeding supplement) PROSource Plus  30 mL Oral TID WC  . sodium chloride    Intravenous Once  . Chlorhexidine Gluconate Cloth  6 each Topical Daily  . colchicine  0.6 mg Oral Daily  . collagenase   Topical Daily  . enoxaparin (LOVENOX) injection  40 mg Subcutaneous Q24H  . feeding supplement  1 Container Oral TID BM  . folic acid  1 mg Oral Daily  . insulin aspart  0-5 Units Subcutaneous QHS  . insulin aspart  0-9 Units Subcutaneous TID WC  . multivitamin with minerals  1 tablet Oral Daily  . pantoprazole  40 mg Oral BID  . predniSONE  40 mg Oral Q breakfast  . psyllium  1 packet Oral Daily  . thiamine  100 mg Oral Daily   Continuous Infusions:   LOS: 20 days    Time spent: over 30 min    Fayrene Helper, MD [facility]   To contact the attending provider between 7A-7P or the covering provider during after hours 7P-7A, please log into the web site [email or web address] and access using universal [place] password for that web site. If you do not have the password, please call the hospital operator.  04/06/2020, 6:00 PM

## 2020-04-06 NOTE — TOC Progression Note (Signed)
Transition of Care [facility]) - Progression Note    Patient Details  Name: [name] [name] MRN: [id number] Date of Birth: Nov 30, 1945  Transition of Care [facility]) CM/SW Contact  Patrice [place], [place] Phone Number: [phone] 04/06/2020, 11:20 AM  Clinical Narrative:     CSW spoke with at [facility] at [facility] and she informed CSW that they can accept patient tomorrow. Patient will need new COVID prior to discharge. MD alerted.  TOC team will continue to assist with discharge planning needs.  Expected Discharge Plan: Skilled Nursing Facility Barriers to Discharge: Barriers Resolved  Expected Discharge Plan and Services Expected Discharge Plan: Skilled Nursing Facility In-house Referral: Clinical Social Work Discharge Planning Services: CM Consult   Living arrangements for the past 2 months: Single Family Home Expected Discharge Date: 03/26/20                                     Social Determinants of Health (SDOH) Interventions    Readmission Risk Interventions No flowsheet data found.

## 2020-04-06 NOTE — Progress Notes (Signed)
Patient sacral wound dressing pealed back with physician at bedside, dressing remains clean, dry and intact with no new drainage. Will continue to monitor and assess daily.

## 2020-04-07 LAB — CBC WITH DIFFERENTIAL/PLATELET
Abs Immature Granulocytes: 0.97 10*3/uL — ABNORMAL HIGH (ref 0.00–0.07)
Basophils Absolute: 0 10*3/uL (ref 0.0–0.1)
Basophils Relative: 0 %
Eosinophils Absolute: 0 10*3/uL (ref 0.0–0.5)
Eosinophils Relative: 0 %
HCT: 25.7 % — ABNORMAL LOW (ref 36.0–46.0)
Hemoglobin: 7.7 g/dL — ABNORMAL LOW (ref 12.0–15.0)
Immature Granulocytes: 7 %
Lymphocytes Relative: 15 %
Lymphs Abs: 2 10*3/uL (ref 0.7–4.0)
MCH: 25.2 pg — ABNORMAL LOW (ref 26.0–34.0)
MCHC: 30 g/dL (ref 30.0–36.0)
MCV: 84 fL (ref 80.0–100.0)
Monocytes Absolute: 0.9 10*3/uL (ref 0.1–1.0)
Monocytes Relative: 6 %
Neutro Abs: 10.2 10*3/uL — ABNORMAL HIGH (ref 1.7–7.7)
Neutrophils Relative %: 72 %
Platelets: 956 10*3/uL (ref 150–400)
RBC: 3.06 MIL/uL — ABNORMAL LOW (ref 3.87–5.11)
RDW: 18.6 % — ABNORMAL HIGH (ref 11.5–15.5)
WBC: 14.1 10*3/uL — ABNORMAL HIGH (ref 4.0–10.5)
nRBC: 0.1 % (ref 0.0–0.2)

## 2020-04-07 LAB — COMPREHENSIVE METABOLIC PANEL
ALT: 66 U/L — ABNORMAL HIGH (ref 0–44)
AST: 76 U/L — ABNORMAL HIGH (ref 15–41)
Albumin: 2.1 g/dL — ABNORMAL LOW (ref 3.5–5.0)
Alkaline Phosphatase: 102 U/L (ref 38–126)
Anion gap: 10 (ref 5–15)
BUN: 24 mg/dL — ABNORMAL HIGH (ref 8–23)
CO2: 24 mmol/L (ref 22–32)
Calcium: 8.7 mg/dL — ABNORMAL LOW (ref 8.9–10.3)
Chloride: 106 mmol/L (ref 98–111)
Creatinine, Ser: 1.17 mg/dL — ABNORMAL HIGH (ref 0.44–1.00)
GFR, Estimated: 49 mL/min — ABNORMAL LOW (ref >60)
Glucose, Bld: 217 mg/dL — ABNORMAL HIGH (ref 70–99)
Potassium: 3.6 mmol/L (ref 3.5–5.1)
Sodium: 140 mmol/L (ref 135–145)
Total Bilirubin: 0.2 mg/dL — ABNORMAL LOW (ref 0.3–1.2)
Total Protein: 6.1 g/dL — ABNORMAL LOW (ref 6.5–8.1)

## 2020-04-07 LAB — PHOSPHORUS: Phosphorus: 2.7 mg/dL (ref 2.5–4.6)

## 2020-04-07 LAB — MAGNESIUM: Magnesium: 1.8 mg/dL (ref 1.7–2.4)

## 2020-04-07 LAB — GLUCOSE, CAPILLARY
Glucose-Capillary: 106 mg/dL — ABNORMAL HIGH (ref 70–99)
Glucose-Capillary: 135 mg/dL — ABNORMAL HIGH (ref 70–99)
Glucose-Capillary: 192 mg/dL — ABNORMAL HIGH (ref 70–99)

## 2020-04-07 LAB — HEMOGLOBIN AND HEMATOCRIT, BLOOD
HCT: 27.5 % — ABNORMAL LOW (ref 36.0–46.0)
Hemoglobin: 8.8 g/dL — ABNORMAL LOW (ref 12.0–15.0)

## 2020-04-07 LAB — PREPARE RBC (CROSSMATCH)

## 2020-04-07 MED ORDER — SODIUM CHLORIDE 0.9% IV SOLUTION: Freq: Once | INTRAVENOUS | Status: AC

## 2020-04-07 NOTE — Progress Notes (Signed)
PROGRESS NOTE    [name] [name]  [id number] DOB: 12-Jan-1946 DOA: 03/16/2020 PCP: Hoyt Koch, MD  No chief complaint on file.  Brief Narrative: 75 year old female with Arieon Corcoran history of diabetes mellitus type 2, chronic kidney disease stage IIIa, CHF, chronic lymphedema, hypertension, osteoarthritis was brought to ED by EMS.  Daughter reported that she has not been able to get in touch with her mom for past couple of days so she called the police due to safety check on her and found her lying on the floor covered with stool and bugs crawling all over her.  She was seen by her PCP for intractable nausea vomiting for 4 to 6 weeks, associated with profound weakness and was also treated for UTI.  She was admitted with acute metabolic encephalopathy. CT head showed no acute abnormality.  Her mental status had gradually improved.  She underwent EGD on 03/23/2020 which showed reflux esophagitis, multiple large and small nonbleeding duodenal ulcers.  Assessment & Plan:   Principal Problem:   UTI (urinary tract infection) Active Problems:   HTN (hypertension)   DM type 2 (diabetes mellitus, type 2) (HCC)   Fever   Severe sepsis without septic shock (HCC)   Hypothermia   Acute kidney injury superimposed on chronic kidney disease (HCC)   Delirium   Protein-calorie malnutrition, severe   Duodenal ulcer   Acute esophagitis   Wernicke's aphasia   Polyarthralgia  1. Acute metabolic encephalopathy- multifactorial from acute kidney injury, dehydration, hyponatremia also concern for Wernicke's encephalopathy.  She was initially placed on IV ceftriaxone due to concern for UTI but cultures only grew 20,000 colonies of Enterococcus.  She received 3 days of intravenous antibiotics followed by Guneet Delpino dose of fosfomycin on 03/19/2020.  CT head, ammonia, TSH, B12, cortisol were unremarkable.  MRI brain showed findings suggestive of Wernicke's encephalopathy.  B1 level was low.  Currently patient is on  thiamine supplementation 100 mg po daily.  She did receive high-dose IV thiamine for 10 days in the hospital.  Improved.  2. Fever-patient was supposed to discharge to skilled nursing facility on 03/26/2020 however she developed fever.  She did have Jarae Nemmers UTI with Enterococcus which was treated with fosfomycin.  Urinalysis showed rare bacteria and moderate leukocytes.  Another potential source could be from sacral ulcers though they look quite superficial.  MRI pelvis did not show deep abscess however raised the possibility of underlying cellulitis with local lymphadenopathy.  She was started on doxycycline 03/28/2020. Initially had fever improved and ID was consulted.  She was found to have swelling and warmth of right elbow and right knee, x-ray of the joint shows polyarticular pseudogout.  Patient started on colchicine.         -Being followed by infectious disease.  She is off antibiotics at this time. She is continuing to complain of some pain in her joints. Per ID likely polyarticular pseudogout,  continue with supportive care/anti-inflammatory.  Low suspicion for infectious syndrome.  Arthrocentesis with 16,35 WBC's, intracellular calcium pyrophosphate crystals.  No growth x2.  3. Polyarticular pseudogout-confirmed on x-ray of bilateral knees.  Started on anti-inflammatory medication, colchicine 0.6 mg daily.  ID following. 1. Elevated inflammatory markers 2. Given polyarticular involvement and continued pain on colchicine, will plan for systemic steroids -> arthrocentesis shows findings concerning for pseudogout.  Will start steroids, follow response, will need taper.  4. Lightheadedness  Symptomatic Orthostasis 1. Complains of lightheadedness when sitting up today, in setting of her anemia, will transfuse 1 unit pRBC and  follow symptoms  5. Chronic nausea and vomiting/dysphagia-unclear etiology, GI was consulted, patient underwent EGD on 03/23/2020 which showed grade C reflux esophagitis in the  distal one third of the esophagus, no stricture noted.  Also showed multiple large and small nonbleeding duodenal ulcers with pigmented material in the bulb and postbulbar area.  Continue PPI, Carafate.  Avoid NSAIDs.  Speech therapy saw the patient and recommended dysphagia 2 diet with thin liquids.  6. Acute kidney injury on CKD stage IIIa-baseline creatinine around 1.5, improved with IV fluids.  7. Hypernatremia-secondary to dehydration, received IV fluids and sodium.  Improved.  8. Hypokalemia, hypomagnesemia, hypophosphatemia- improved, continue to follow  9. Anemia  Folate Deficiency- relatively stable today.  Continue to monitor 1. Supplement folate  10. Moisture Associated Skin Damage  Intertriginous Dermatitis:  1. Wound care recs, appreciate assistance 2. Rectal tube and foley d/c'd  11. Thrombocytosis-likely in setting of inflammation/infection.  Normal iron studies.  1. Will need outpatient follow up with oncology  DVT prophylaxis: lovenox Code Status: full  Family Communication: none at bedside Disposition:   Status is: Inpatient  Remains inpatient appropriate because:Inpatient level of care appropriate due to severity of illness    Dispo: The patient is from: Home              Anticipated d/c is to: SNF              Anticipated d/c date is: > 3 days              Patient currently is not medically stable to d/c.       Consultants:   ID  Urology  GI  Procedures:  03/23/20 impression - LA Grade C reflux esophagitis with no bleeding in the distal 1/3 of the esophagus; no stricture identifed. - Normal appearing stomach; antral biopsies done to rule out H. pylori by pathology. - Multiple large and small non-bleeding duodenal ulcers with pigmented material in the bulb and post-bulbar area. Recommendation Full liquid diet today. - No Ibuprofen, Naproxen, or other non-steroidal anti-inflammatory drugs for now. - Use Protonix (Pantoprazole) 40 mg IV BID x  3 days. - Use sucralfate suspension 1 gram PO QID daily. - Await pathology results. - May need Marguerita Stapp gastrin level checked off of PPI's in the future.  Antimicrobials:  Anti-infectives (From admission, onward)   Start     Dose/Rate Route Frequency Ordered Stop   03/28/20 1200  doxycycline (VIBRA-TABS) tablet 100 mg  Status:  Discontinued        100 mg Oral Every 12 hours 03/28/20 1101 04/01/20 1150   03/26/20 1500  cefTRIAXone (ROCEPHIN) 1 g in sodium chloride 0.9 % 100 mL IVPB  Status:  Discontinued        1 g 200 mL/hr over 30 Minutes Intravenous Every 24 hours 03/26/20 1410 03/27/20 1457   03/19/20 1300  fosfomycin (MONUROL) packet 3 g        3 g Oral  Once 03/19/20 1159 03/19/20 1415   03/18/20 0600  cefTRIAXone (ROCEPHIN) 1 g in sodium chloride 0.9 % 100 mL IVPB  Status:  Discontinued        1 g 200 mL/hr over 30 Minutes Intravenous Every 24 hours 03/17/20 0519 03/19/20 1159   03/17/20 0415  cefTRIAXone (ROCEPHIN) 1 g in sodium chloride 0.9 % 100 mL IVPB        1 g 200 mL/hr over 30 Minutes Intravenous  Once 03/17/20 0406 03/17/20 0448      Subjective:  Complains of lightheadedness today  Objective: Vitals:   04/07/20 1005 04/07/20 1340 04/07/20 1409 04/07/20 1601  BP:  115/67 118/73 119/69  Pulse:  80 79 88  Resp:  14 14 16   Temp:  98.6 F (37 C) 98.4 F (36.9 C) 98.5 F (36.9 C)  TempSrc:  Oral Oral Oral  SpO2: 99% 100% 100% 100%  Weight:      Height:        Intake/Output Summary (Last 24 hours) at 04/07/2020 1728 Last data filed at 04/07/2020 1601 Gross per 24 hour  Intake 765 ml  Output 501 ml  Net 264 ml   Filed Weights   03/19/20 1100  Weight: 113.4 kg    Examination:  General: No acute distress. Sitting up on edge of bed. Cardiovascular: Heart sounds show Wretha Laris regular rate, and rhythm Lungs: Clear to auscultation bilaterally  Abdomen: Soft, nontender, nondistended  Neurological: Alert and oriented 3. Moves all extremities 4 . Cranial nerves II through XII  grossly intact. Skin: sacrum not examined today Extremities: bilateral chronic lymphedema   Data Reviewed: I have personally reviewed following labs and imaging studies  CBC: Recent Labs  Lab 04/03/20 0045 04/03/20 1909 04/04/20 0035 04/05/20 0124 04/06/20 0224 04/07/20 0126  WBC 7.2  --  7.4 7.3 10.5 14.1*  NEUTROABS  --   --  3.5 3.8 8.3* 10.2*  HGB 7.1* 7.2* 7.3* 8.0* 7.9* 7.7*  HCT 23.2* 22.8* 22.6* 26.7* 25.9* 25.7*  MCV 83.2  --  81.3 83.7 82.5 84.0  PLT 760*  --  977* 903* 924* 956*    Basic Metabolic Panel: Recent Labs  Lab 04/01/20 0644 04/02/20 0047 04/03/20 0045 04/04/20 0035 04/05/20 0124 04/06/20 0224 04/07/20 0126  NA 140   < > 140 139 141 140 140  K 3.4*   < > 4.0 3.6 3.6 3.7 3.6  CL 105   < > 106 105 105 105 106  CO2 21*   < > 25 25 25 25 24   GLUCOSE 110*   < > 166* 108* 136* 196* 217*  BUN 17   < > 18 16 16 20  24*  CREATININE 1.05*   < > 1.06* 0.98 1.08* 0.92 1.17*  CALCIUM 8.6*   < > 8.5* 8.7* 8.7* 8.6* 8.7*  MG 1.6*  --  1.9  --  1.7 1.7 1.8  PHOS 2.6  --   --   --  2.7 2.8 2.7   < > = values in this interval not displayed.    GFR: Estimated Creatinine Clearance: 58.5 mL/min (Zachari Alberta) (by C-G formula based on SCr of 1.17 mg/dL (H)).  Liver Function Tests: Recent Labs  Lab 04/01/20 0644 04/04/20 0035 04/05/20 0124 04/06/20 0224 04/07/20 0126  AST 34 98* 75* 48* 76*  ALT 33 63* 60* 53* 66*  ALKPHOS 126 118 120 105 102  BILITOT 0.8 0.3 0.4 0.3 0.2*  PROT 5.6* 5.7* 6.1* 5.9* 6.1*  ALBUMIN 1.7* 1.6* 1.8* 1.9* 2.1*    CBG: Recent Labs  Lab 04/06/20 1208 04/06/20 1545 04/06/20 2121 04/07/20 0749 04/07/20 1153  GLUCAP 127* 179* 219* 106* 135*     Recent Results (from the past 240 hour(s))  Culture, blood (Routine X 2) w Reflex to ID Panel     Status: None   Collection Time: 03/31/20  6:22 AM   Specimen: BLOOD RIGHT FOREARM  Result Value Ref Range Status   Specimen Description BLOOD RIGHT FOREARM  Final   Special Requests   Final  BOTTLES DRAWN AEROBIC ONLY Blood Culture adequate volume   Culture   Final    NO GROWTH 5 DAYS Performed at [facility], [place] [address] [place], [place] [address]    Report Status 04/05/2020 FINAL  Final  Culture, blood (Routine X 2) w Reflex to ID Panel     Status: None   Collection Time: 03/31/20  6:22 AM   Specimen: BLOOD RIGHT WRIST  Result Value Ref Range Status   Specimen Description BLOOD RIGHT WRIST  Final   Special Requests   Final    BOTTLES DRAWN AEROBIC ONLY Blood Culture results may not be optimal due to an inadequate volume of blood received in culture bottles   Culture   Final    NO GROWTH 5 DAYS Performed at [facility], [place] [address], [place], [place] [address]    Report Status 04/05/2020 FINAL  Final  Body fluid culture     Status: None (Preliminary result)   Collection Time: 04/04/20  4:00 PM   Specimen: Body Fluid  Result Value Ref Range Status   Specimen Description FLUID RIGHT KNEE  Final   Special Requests NONE  Final   Gram Stain   Final    RARE WBC PRESENT,BOTH PMN AND MONONUCLEAR NO ORGANISMS SEEN    Culture   Final    NO GROWTH 3 DAYS Performed at [facility], [address], [place], [place] [address]    Report Status PENDING  Incomplete  Resp Panel by RT-PCR (Flu Jylian Pappalardo&B, Covid) Nasopharyngeal Swab     Status: None   Collection Time: 04/06/20 12:10 PM   Specimen: Nasopharyngeal Swab; Nasopharyngeal(NP) swabs in vial transport medium  Result Value Ref Range Status   SARS Coronavirus 2 by RT PCR NEGATIVE NEGATIVE Final    Comment: (NOTE) SARS-CoV-2 target nucleic acids are NOT DETECTED.  The SARS-CoV-2 RNA is generally detectable in upper respiratory specimens during the acute phase of infection. The lowest concentration of SARS-CoV-2 viral copies this assay can detect is 138 copies/mL. Magdalene Tardiff negative result does not preclude SARS-Cov-2 infection and should not be used as the sole basis for treatment or other patient  management decisions. Trentin Knappenberger negative result may occur with  improper specimen collection/handling, submission of specimen other than nasopharyngeal swab, presence of viral mutation(s) within the areas targeted by this assay, and inadequate number of viral copies(<138 copies/mL). Lexianna Weinrich negative result must be combined with clinical observations, patient history, and epidemiological information. The expected result is Negative.  Fact Sheet for Patients:  EntrepreneurPulse.com.au  Fact Sheet for Healthcare Providers:  IncredibleEmployment.be  This test is no t yet approved or cleared by the [place] FDA and  has been authorized for detection and/or diagnosis of SARS-CoV-2 by FDA under an Emergency Use Authorization (EUA). This EUA will remain  in effect (meaning this test can be used) for the duration of the COVID-19 declaration under Section 564(b)(1) of the Act, 21 U.S.C.section 360bbb-3(b)(1), unless the authorization is terminated  or revoked sooner.       Influenza Harrell Niehoff by PCR NEGATIVE NEGATIVE Final   Influenza B by PCR NEGATIVE NEGATIVE Final    Comment: (NOTE) The Xpert Xpress SARS-CoV-2/FLU/RSV plus assay is intended as an aid in the diagnosis of influenza from Nasopharyngeal swab specimens and should not be used as Torres Hardenbrook sole basis for treatment. Nasal washings and aspirates are unacceptable for Xpert Xpress SARS-CoV-2/FLU/RSV testing.  Fact Sheet for Patients: EntrepreneurPulse.com.au  Fact Sheet for Healthcare Providers: IncredibleEmployment.be  This test is not yet approved or cleared by the [place] and has been authorized for detection and/or diagnosis of SARS-CoV-2 by FDA under an Emergency Use Authorization (EUA). This EUA will remain in effect (meaning this test can be used) for the duration of the COVID-19 declaration under Section 564(b)(1) of the Act, 21 U.S.C. section 360bbb-3(b)(1),  unless the authorization is terminated or revoked.  Performed at [facility], [place] [address] [place], [place] [address]          Radiology Studies: No results found.      Scheduled Meds: . (feeding supplement) PROSource Plus  30 mL Oral TID WC  . sodium chloride   Intravenous Once  . Chlorhexidine Gluconate Cloth  6 each Topical Daily  . colchicine  0.6 mg Oral Daily  . collagenase   Topical Daily  . enoxaparin (LOVENOX) injection  40 mg Subcutaneous Q24H  . feeding supplement  1 Container Oral TID BM  . folic acid  1 mg Oral Daily  . insulin aspart  0-5 Units Subcutaneous QHS  . insulin aspart  0-9 Units Subcutaneous TID WC  . multivitamin with minerals  1 tablet Oral Daily  . pantoprazole  40 mg Oral BID  . predniSONE  40 mg Oral Q breakfast  . psyllium  1 packet Oral Daily  . thiamine  100 mg Oral Daily   Continuous Infusions:   LOS: 21 days    Time spent: over 30 min    Fayrene Helper, MD [facility]   To contact the attending provider between 7A-7P or the covering provider during after hours 7P-7A, please log into the web site [email or web address] and access using universal [place] password for that web site. If you do not have the password, please call the hospital operator.  04/07/2020, 5:28 PM

## 2020-04-07 NOTE — Progress Notes (Signed)
   04/07/20 1005  Vitals  Patient Position (if appropriate) Orthostatic Vitals  Orthostatic Lying   BP- Lying 117/72  Pulse- Lying 78  Orthostatic Sitting  BP- Sitting 111/78  Pulse- Sitting 96  Orthostatic Standing at 0 minutes  BP- Standing at 0 minutes  (-pt unable to stand for readiing)  Orthostatic Standing at 3 minutes  BP- Standing at 3 minutes  (-pt unable to stand for reading)  Oxygen Therapy  SpO2 99 %  O2 Device Room Air    Patient unable to stand for standing readings. Lacretia Nicks MD notified. Will continue to monitor.

## 2020-04-08 DIAGNOSIS — G9340 Encephalopathy, unspecified: Secondary | ICD-10-CM | POA: Diagnosis not present

## 2020-04-08 DIAGNOSIS — I517 Cardiomegaly: Secondary | ICD-10-CM | POA: Diagnosis not present

## 2020-04-08 DIAGNOSIS — Z8616 Personal history of COVID-19: Secondary | ICD-10-CM | POA: Diagnosis not present

## 2020-04-08 DIAGNOSIS — U071 COVID-19: Secondary | ICD-10-CM | POA: Diagnosis not present

## 2020-04-08 DIAGNOSIS — M4802 Spinal stenosis, cervical region: Secondary | ICD-10-CM | POA: Diagnosis not present

## 2020-04-08 DIAGNOSIS — R197 Diarrhea, unspecified: Secondary | ICD-10-CM | POA: Diagnosis not present

## 2020-04-08 DIAGNOSIS — E669 Obesity, unspecified: Secondary | ICD-10-CM | POA: Diagnosis not present

## 2020-04-08 DIAGNOSIS — M1990 Unspecified osteoarthritis, unspecified site: Secondary | ICD-10-CM | POA: Diagnosis not present

## 2020-04-08 DIAGNOSIS — R5381 Other malaise: Secondary | ICD-10-CM | POA: Diagnosis not present

## 2020-04-08 DIAGNOSIS — M25561 Pain in right knee: Secondary | ICD-10-CM | POA: Diagnosis not present

## 2020-04-08 DIAGNOSIS — M545 Low back pain, unspecified: Secondary | ICD-10-CM | POA: Diagnosis not present

## 2020-04-08 DIAGNOSIS — R278 Other lack of coordination: Secondary | ICD-10-CM | POA: Diagnosis not present

## 2020-04-08 DIAGNOSIS — E512 Wernicke's encephalopathy: Secondary | ICD-10-CM | POA: Diagnosis not present

## 2020-04-08 DIAGNOSIS — R2681 Unsteadiness on feet: Secondary | ICD-10-CM | POA: Diagnosis not present

## 2020-04-08 DIAGNOSIS — Q820 Hereditary lymphedema: Secondary | ICD-10-CM | POA: Diagnosis not present

## 2020-04-08 DIAGNOSIS — R7989 Other specified abnormal findings of blood chemistry: Secondary | ICD-10-CM | POA: Diagnosis not present

## 2020-04-08 DIAGNOSIS — N3000 Acute cystitis without hematuria: Secondary | ICD-10-CM | POA: Diagnosis not present

## 2020-04-08 DIAGNOSIS — R296 Repeated falls: Secondary | ICD-10-CM | POA: Diagnosis not present

## 2020-04-08 DIAGNOSIS — M109 Gout, unspecified: Secondary | ICD-10-CM | POA: Diagnosis not present

## 2020-04-08 DIAGNOSIS — E43 Unspecified severe protein-calorie malnutrition: Secondary | ICD-10-CM | POA: Diagnosis not present

## 2020-04-08 DIAGNOSIS — N179 Acute kidney failure, unspecified: Secondary | ICD-10-CM | POA: Diagnosis not present

## 2020-04-08 DIAGNOSIS — Z79899 Other long term (current) drug therapy: Secondary | ICD-10-CM | POA: Diagnosis not present

## 2020-04-08 DIAGNOSIS — M1712 Unilateral primary osteoarthritis, left knee: Secondary | ICD-10-CM | POA: Diagnosis not present

## 2020-04-08 DIAGNOSIS — M6389 Disorders of muscle in diseases classified elsewhere, multiple sites: Secondary | ICD-10-CM | POA: Diagnosis not present

## 2020-04-08 DIAGNOSIS — I350 Nonrheumatic aortic (valve) stenosis: Secondary | ICD-10-CM | POA: Diagnosis not present

## 2020-04-08 DIAGNOSIS — M6281 Muscle weakness (generalized): Secondary | ICD-10-CM | POA: Diagnosis not present

## 2020-04-08 DIAGNOSIS — I69321 Dysphasia following cerebral infarction: Secondary | ICD-10-CM | POA: Diagnosis not present

## 2020-04-08 DIAGNOSIS — I82491 Acute embolism and thrombosis of other specified deep vein of right lower extremity: Secondary | ICD-10-CM | POA: Diagnosis not present

## 2020-04-08 DIAGNOSIS — K219 Gastro-esophageal reflux disease without esophagitis: Secondary | ICD-10-CM | POA: Diagnosis not present

## 2020-04-08 DIAGNOSIS — N1831 Chronic kidney disease, stage 3a: Secondary | ICD-10-CM | POA: Diagnosis not present

## 2020-04-08 DIAGNOSIS — G8929 Other chronic pain: Secondary | ICD-10-CM | POA: Diagnosis not present

## 2020-04-08 DIAGNOSIS — L89153 Pressure ulcer of sacral region, stage 3: Secondary | ICD-10-CM | POA: Diagnosis not present

## 2020-04-08 DIAGNOSIS — L97911 Non-pressure chronic ulcer of unspecified part of right lower leg limited to breakdown of skin: Secondary | ICD-10-CM | POA: Diagnosis not present

## 2020-04-08 DIAGNOSIS — I10 Essential (primary) hypertension: Secondary | ICD-10-CM | POA: Diagnosis not present

## 2020-04-08 DIAGNOSIS — S81802D Unspecified open wound, left lower leg, subsequent encounter: Secondary | ICD-10-CM | POA: Diagnosis not present

## 2020-04-08 DIAGNOSIS — R2689 Other abnormalities of gait and mobility: Secondary | ICD-10-CM | POA: Diagnosis not present

## 2020-04-08 DIAGNOSIS — M5441 Lumbago with sciatica, right side: Secondary | ICD-10-CM | POA: Diagnosis not present

## 2020-04-08 DIAGNOSIS — D649 Anemia, unspecified: Secondary | ICD-10-CM | POA: Diagnosis not present

## 2020-04-08 DIAGNOSIS — G992 Myelopathy in diseases classified elsewhere: Secondary | ICD-10-CM | POA: Diagnosis not present

## 2020-04-08 DIAGNOSIS — R918 Other nonspecific abnormal finding of lung field: Secondary | ICD-10-CM | POA: Diagnosis not present

## 2020-04-08 DIAGNOSIS — E119 Type 2 diabetes mellitus without complications: Secondary | ICD-10-CM | POA: Diagnosis not present

## 2020-04-08 DIAGNOSIS — I82409 Acute embolism and thrombosis of unspecified deep veins of unspecified lower extremity: Secondary | ICD-10-CM | POA: Diagnosis not present

## 2020-04-08 DIAGNOSIS — N189 Chronic kidney disease, unspecified: Secondary | ICD-10-CM | POA: Diagnosis not present

## 2020-04-08 DIAGNOSIS — M1711 Unilateral primary osteoarthritis, right knee: Secondary | ICD-10-CM | POA: Diagnosis not present

## 2020-04-08 DIAGNOSIS — R488 Other symbolic dysfunctions: Secondary | ICD-10-CM | POA: Diagnosis not present

## 2020-04-08 DIAGNOSIS — I890 Lymphedema, not elsewhere classified: Secondary | ICD-10-CM | POA: Diagnosis not present

## 2020-04-08 DIAGNOSIS — I82431 Acute embolism and thrombosis of right popliteal vein: Secondary | ICD-10-CM | POA: Diagnosis not present

## 2020-04-08 DIAGNOSIS — F802 Mixed receptive-expressive language disorder: Secondary | ICD-10-CM | POA: Diagnosis not present

## 2020-04-08 DIAGNOSIS — M25562 Pain in left knee: Secondary | ICD-10-CM | POA: Diagnosis not present

## 2020-04-08 DIAGNOSIS — M2550 Pain in unspecified joint: Secondary | ICD-10-CM | POA: Diagnosis not present

## 2020-04-08 DIAGNOSIS — R030 Elevated blood-pressure reading, without diagnosis of hypertension: Secondary | ICD-10-CM | POA: Diagnosis not present

## 2020-04-08 DIAGNOSIS — E440 Moderate protein-calorie malnutrition: Secondary | ICD-10-CM | POA: Diagnosis not present

## 2020-04-08 DIAGNOSIS — M5442 Lumbago with sciatica, left side: Secondary | ICD-10-CM | POA: Diagnosis not present

## 2020-04-08 LAB — CBC WITH DIFFERENTIAL/PLATELET
Abs Immature Granulocytes: 1.15 10*3/uL — ABNORMAL HIGH (ref 0.00–0.07)
Basophils Absolute: 0.1 10*3/uL (ref 0.0–0.1)
Basophils Relative: 1 %
Eosinophils Absolute: 0 10*3/uL (ref 0.0–0.5)
Eosinophils Relative: 0 %
HCT: 26.5 % — ABNORMAL LOW (ref 36.0–46.0)
Hemoglobin: 8.4 g/dL — ABNORMAL LOW (ref 12.0–15.0)
Immature Granulocytes: 8 %
Lymphocytes Relative: 23 %
Lymphs Abs: 3.2 10*3/uL (ref 0.7–4.0)
MCH: 26.1 pg (ref 26.0–34.0)
MCHC: 31.7 g/dL (ref 30.0–36.0)
MCV: 82.3 fL (ref 80.0–100.0)
Monocytes Absolute: 0.9 10*3/uL (ref 0.1–1.0)
Monocytes Relative: 7 %
Neutro Abs: 8.4 10*3/uL — ABNORMAL HIGH (ref 1.7–7.7)
Neutrophils Relative %: 61 %
Platelets: 879 10*3/uL — ABNORMAL HIGH (ref 150–400)
RBC: 3.22 MIL/uL — ABNORMAL LOW (ref 3.87–5.11)
RDW: 18.8 % — ABNORMAL HIGH (ref 11.5–15.5)
WBC: 13.9 10*3/uL — ABNORMAL HIGH (ref 4.0–10.5)
nRBC: 0.4 % — ABNORMAL HIGH (ref 0.0–0.2)

## 2020-04-08 LAB — COMPREHENSIVE METABOLIC PANEL
ALT: 72 U/L — ABNORMAL HIGH (ref 0–44)
AST: 63 U/L — ABNORMAL HIGH (ref 15–41)
Albumin: 2.1 g/dL — ABNORMAL LOW (ref 3.5–5.0)
Alkaline Phosphatase: 95 U/L (ref 38–126)
Anion gap: 12 (ref 5–15)
BUN: 27 mg/dL — ABNORMAL HIGH (ref 8–23)
CO2: 25 mmol/L (ref 22–32)
Calcium: 8.7 mg/dL — ABNORMAL LOW (ref 8.9–10.3)
Chloride: 104 mmol/L (ref 98–111)
Creatinine, Ser: 1 mg/dL (ref 0.44–1.00)
GFR, Estimated: 59 mL/min — ABNORMAL LOW (ref >60)
Glucose, Bld: 118 mg/dL — ABNORMAL HIGH (ref 70–99)
Potassium: 3.3 mmol/L — ABNORMAL LOW (ref 3.5–5.1)
Sodium: 141 mmol/L (ref 135–145)
Total Bilirubin: 0.6 mg/dL (ref 0.3–1.2)
Total Protein: 5.7 g/dL — ABNORMAL LOW (ref 6.5–8.1)

## 2020-04-08 LAB — BPAM RBC
Blood Product Expiration Date: 202201232359
ISSUE DATE / TIME: 202201021331
Unit Type and Rh: 7300

## 2020-04-08 LAB — BODY FLUID CULTURE: Culture: NO GROWTH

## 2020-04-08 LAB — TYPE AND SCREEN
ABO/RH(D): B POS
Antibody Screen: NEGATIVE
Unit division: 0

## 2020-04-08 LAB — GLUCOSE, CAPILLARY
Glucose-Capillary: 112 mg/dL — ABNORMAL HIGH (ref 70–99)
Glucose-Capillary: 190 mg/dL — ABNORMAL HIGH (ref 70–99)

## 2020-04-08 MED ORDER — FOLIC ACID 1 MG PO TABS: 1.0000 mg | ORAL_TABLET | Freq: Every day | ORAL | 0 refills | Status: AC

## 2020-04-08 MED ORDER — PREDNISONE 20 MG PO TABS: ORAL_TABLET | ORAL | 0 refills | Status: AC

## 2020-04-08 MED ORDER — COLLAGENASE 250 UNIT/GM EX OINT: TOPICAL_OINTMENT | Freq: Every day | CUTANEOUS | 0 refills | Status: DC

## 2020-04-08 NOTE — Progress Notes (Signed)
AVS and social worker's paperwork placed in discharge packet. Report called and given to [facility] at [facility]. All questions answered to satisfaction. [facility] to transport pt with all belongings.

## 2020-04-08 NOTE — Discharge Summary (Addendum)
Physician Discharge Summary  [name] [name] [id number] DOB: 02-Feb-1946 DOA: 03/16/2020  PCP: [name] Koch, MD  Admit date: 03/16/2020 Discharge date: 04/08/2020  Time spent: 40 minutes  Recommendations for Outpatient Follow-up:  1. Follow outpatient CBC/CMP 2. Continue physical therapy in SNF 3. Follow pseudogout flare with steroid taper - rheumatology outpatient referral placed 4. Follow blood pressure outpatient, metoprolol, losartan, and lasix discontinued 5. orthostasis improved on day of discharge, follow outpatient  6. Follow with neurology outpt for wernickes as well as the cervical stenosis - spine surgery referral made as well 7. Continue PPI, carafate - avoid NSAIDs - follow up with GI outpatient 8. Continue wound care outpatient as noted per orders below 9. Follow outpatient with heme/onc for thrombocytosis  10. MRI pelvis showed mild bilateral groin lymphadenopathy, needs follow up imaging   Discharge Diagnoses:  Principal Problem:   UTI (urinary tract infection) Active Problems:   HTN (hypertension)   DM type 2 (diabetes mellitus, type 2) (HCC)   Fever   Severe sepsis without septic shock (HCC)   Hypothermia   Acute kidney injury superimposed on chronic kidney disease ([place])   Delirium   Protein-calorie malnutrition, severe   Duodenal ulcer   Acute esophagitis   Wernicke's aphasia   Polyarthralgia   Discharge Condition: stable  Diet recommendation: heart healthy  Filed Weights   03/19/20 1100  Weight: 113.4 kg    History of present illness:  75 year old female with [name] [name] history of diabetes mellitus type 2, chronic kidney disease stage IIIa, CHF, chronic lymphedema, hypertension, osteoarthritis was brought to ED by EMS. Daughter reported that she has not been able to get in touch with her mom for past couple of days so she called the police due to safety check on her and found her lying on the floor covered with stool and bugs crawling all over  her. She was seen by her PCP for intractable nausea vomiting for 4 to 6 weeks, associated with profound weakness and was also treated for UTI. She was admitted with acute metabolic encephalopathy.CT head showed no acute abnormality. Her mental status had gradually improved. She underwent EGD on 03/23/2020 which showed reflux esophagitis, multiple large and small nonbleeding duodenal ulcers.  She was found down and admitted to the hospital for altered mental status and weakness.  She was found to have altered mental status thought to be 2/2 [name] [name] UTI and wernicke's encephalopathy.  Her mental status has improved.  She's had fevers which were thought to be 2/2 pseudogout flare, she's improved with steroids.  She also underwent EGD which should reflux esophagitis and multiple large and small nonbleeding duodenal ulcers.  She was stable on 1/3 for discharge to snf for rehab.  Follow outpatient with neurology, rheum, and heme/onc.  See below for additional details  Hospital Course:  1. Acute metabolic encephalopathy- multifactorial from acute kidney injury, dehydration, hyponatremia also concern for Wernicke's encephalopathy. She was initially placed on IV ceftriaxone due to concern for UTI but cultures only grew 20,000 colonies of Enterococcus. She received 3 days of intravenous antibiotics followed by [name] [name] dose of fosfomycin on 03/19/2020. CT head, ammonia, TSH, B12, cortisol were unremarkable. MRI brain showed findings suggestive of Wernicke's encephalopathy. B1 level was low. Currently patient is on thiamine supplementation 100 mg po daily. She did receive high-dose IV thiamine for 10 days in the hospital.  Improved.  2. Fever-patient was supposed to discharge to skilled nursing facility on 03/26/2020 however she developed fever. She did have [name] [name] UTI with  Enterococcus which was treated with fosfomycin. Urinalysis showed rare bacteria and moderate leukocytes. Another potential source could be from  sacral ulcers though they look quite superficial. MRI pelvis did not show deep abscess however raised the possibility of underlying cellulitis with local lymphadenopathy. She was started on doxycycline 03/28/2020. Initially had fever improved and ID was consulted. She was found to have swelling and warmth of right elbow and right knee, x-ray of the joint shows polyarticular pseudogout.  -Being followed by infectious disease. She is off antibiotics at this time. She is continuing to complain ofsome pain in her joints. Per IDlikely polyarticular pseudogout,continue with supportive care/anti-inflammatory. Low suspicion for infectious syndrome.  Arthrocentesis R knee with 16,35 WBC's, intracellular calcium pyrophosphate crystals.  No growth x3.  Suspect polyarticular pseudogout (see below).  3. Polyarticular pseudogout-confirmed on x-ray of bilateral knees. Started on anti-inflammatory medication, colchicine 0.6 mg daily. ID following. 1. Elevated inflammatory markers 2. Given polyarticular involvement and continued pain on colchicine, will plan for systemic steroids -> arthrocentesis shows findings concerning for pseudogout.  Will start steroids, follow response, will need taper. 3. Rheumatology referral outpatient   4. Lightheadedness  Symptomatic Orthostasis 1. Complains of lightheadedness when sitting up today, in setting of her anemia, will transfuse 1 unit pRBC and follow symptoms 2. Improved on day of discharge, continue to follow outpatient   5. Chronic nausea and vomiting/dysphagia-unclear etiology, GI was consulted, patient underwent EGD on 03/23/2020 which showed grade C reflux esophagitis in the distal one third of the esophagus, no stricture noted. Also showed multiple large and small nonbleeding duodenal ulcers with pigmented material in the bulb and postbulbar area. ContinuePPI, Carafate. Avoid NSAIDs. Speech therapy saw the patient and recommended dysphagia 2 diet  with thin liquids.  6. Acute kidney injury on CKD stage IIIa-baseline creatinine around 1.5, improved with IV fluids.  7. Hypernatremia-secondary to dehydration, received IV fluids and sodium. Improved.  8. Hypokalemia, hypomagnesemia, hypophosphatemia- improved, continue to follow  9. Anemia  Folate Deficiency- relatively stable today. Continue to monitor 1. Supplement folate  10. Moisture Associated Skin Damage  Intertriginous Dermatitis:  1. Wound care recs, appreciate assistance 2. Rectal tube and foley d/c'd  11. Elevated LFT:  1. Follow outpatient   12. Thrombocytosis-likely in setting of inflammation/infection.  Normal iron studies.  1. Will need outpatient follow up with oncology  Procedures: EGD - LA Grade C reflux esophagitis with no bleeding in the distal 1/3 of the esophagus; no stricture identifed. - Normal appearing stomach; antral biopsies done to rule out H. pylori by pathology. - Multiple large and small non-bleeding duodenal ulcers with pigmented material in the bulb and post bulbar area Recommendation -- Full liquid diet today. - No Ibuprofen, Naproxen, or other non-steroidal anti-inflammatory drugs for now. - Use Protonix (Pantoprazole) 40 mg IV BID x 3 days. - Use sucralfate suspension 1 gram PO QID daily. - Await pathology results. -> reactive gastropathy, negative for h pylori, no intestinal metaplasia, dysplasia, or malignancy - May need Chasya Zenz gastrin level checked off of PPI's in the future.  Right knee aspiration and injection  Consultations:  GI  neurology  Infectious disease  orthopedics  Discharge Exam: Vitals:   04/08/20 1111 04/08/20 1303  BP:  129/75  Pulse:  89  Resp: 19 19  Temp: 97.6 F (36.4 C) 97.8 F (36.6 C)  SpO2:  100%   Lightheadedness improved.  Denied vertiginous symptoms. No CP or SOB. Feels ok today, no new complaints  General: No acute distress. Cardiovascular: Heart  sounds show Annalaya Wile regular rate, and  rhythm Lungs: Clear to auscultation bilaterally Abdomen: Soft, nontender, nondistended Neurological: Alert and oriented 3. Moves all extremities 4. Cranial nerves II through XII grossly intact. Skin: decubitus not examined today Extremities: bilateral lymphedema  Discharge Instructions   Discharge Instructions    Ambulatory referral to Neurology   Complete by: As directed    An appointment is requested in approximately: 4 weeks   Ambulatory referral to Rheumatology   Complete by: As directed    Ambulatory referral to Spine Surgery   Complete by: As directed    Call MD for:  difficulty breathing, headache or visual disturbances   Complete by: As directed    Call MD for:  extreme fatigue   Complete by: As directed    Call MD for:  hives   Complete by: As directed    Call MD for:  persistant dizziness or light-headedness   Complete by: As directed    Call MD for:  persistant nausea and vomiting   Complete by: As directed    Call MD for:  redness, tenderness, or signs of infection (pain, swelling, redness, odor or green/yellow discharge around incision site)   Complete by: As directed    Call MD for:  severe uncontrolled pain   Complete by: As directed    Call MD for:  temperature >100.4   Complete by: As directed    Diet - low sodium heart healthy   Complete by: As directed    Diet - low sodium heart healthy   Complete by: As directed    Discharge instructions   Complete by: As directed    You were seen for confusion after being found down.  You were confused with MRI findings concerning for wernicke's encephalopathy.  We've started you on thiamine supplementation and you were treated for Chasin Findling UTI.    You had Wilberth Damon pseudogout flare and are on Myiah Petkus steroid taper.  Please follow up with your PCP and rheumatology as an outpatient for further evaluation.  You have Alphonza Tramell decubitus ulcer that will need continued wound care and close follow up outpatient.    You have elevated platelets  which should be followed outpatient, please follow outpatient with hematology/oncology.  Return for new, recurrent, or worsening symptoms.  Please ask your PCP to request records from this hospitalization so they know what was done and what the next steps will be.   Discharge wound care:   Complete by: As directed    Apply Santyl to medial gluteal fold wound with yellow slough in Hamdi Kley nickel thick layer. Cover with Chang Tiggs saline moistened gauze, then dry gauze or ABD pad.  Change daily.   Discharge wound care:   Complete by: As directed    Apply Santyl to medial gluteal fold wound with yellow slough in Maricel Swartzendruber nickel thick layer. Cover with Idy Rawling saline moistened gauze, then dry gauze or ABD pad.  Change daily.  Evaluate perineal area   Increase activity slowly   Complete by: As directed    Increase activity slowly   Complete by: As directed      Allergies as of 04/08/2020      Reactions   Oxycodone Hcl Nausea And Vomiting   Penicillins Itching   Has patient had Kavin Weckwerth PCN reaction causing immediate rash, facial/tongue/throat swelling, SOB or lightheadedness with hypotension: No Has patient had Kert Shackett PCN reaction causing severe rash involving mucus membranes or skin necrosis: No Has patient had Jestine Bicknell PCN reaction that required hospitalization: No  Has patient had Derron Pipkins PCN reaction occurring within the last 10 years: No If all of the above answers are "NO", then may proceed with Cephalosporin use.      Medication List    STOP taking these medications   aspirin EC 81 MG tablet   furosemide 40 MG tablet Commonly known as: LASIX   losartan 25 MG tablet Commonly known as: COZAAR   metoprolol tartrate 50 MG tablet Commonly known as: LOPRESSOR     TAKE these medications   collagenase ointment Commonly known as: SANTYL Apply topically daily. Start taking on: January 4, 123456   folic acid 1 MG tablet Commonly known as: FOLVITE Take 1 tablet (1 mg total) by mouth daily. Start taking on: April 09, 2020    metFORMIN 500 MG tablet Commonly known as: GLUCOPHAGE Take 1 tablet (500 mg total) by mouth daily with breakfast.   metoCLOPramide 5 MG tablet Commonly known as: Reglan Take 1 tablet (5 mg total) by mouth 3 (three) times daily as needed for nausea.   montelukast 10 MG tablet Commonly known as: SINGULAIR Take 1 tablet (10 mg total) by mouth at bedtime.   multivitamin with minerals Tabs tablet Take 1 tablet by mouth daily.   nutrition supplement (JUVEN) Pack Take 1 packet by mouth 2 (two) times daily between meals.   feeding supplement (ENSURE COMPLETE) Liqd Take 237 mLs by mouth 2 (two) times daily between meals.   ondansetron 4 MG disintegrating tablet Commonly known as: Zofran ODT Take 1 tablet (4 mg total) by mouth every 8 (eight) hours as needed for nausea or vomiting.   pantoprazole 40 MG tablet Commonly known as: PROTONIX Take 1 tablet (40 mg total) by mouth 2 (two) times daily. What changed: when to take this   predniSONE 20 MG tablet Commonly known as: DELTASONE Take 2 tablets (40 mg total) by mouth daily with breakfast for 1 day, THEN 1.5 tablets (30 mg total) daily with breakfast for 4 days, THEN 1 tablet (20 mg total) daily with breakfast for 4 days, THEN 0.5 tablets (10 mg total) daily with breakfast for 4 days. Start taking on: April 09, 2020   promethazine 25 MG suppository Commonly known as: Phenergan Place 1 suppository (25 mg total) rectally every 6 (six) hours as needed for nausea or vomiting.   sucralfate 1 GM/10ML suspension Commonly known as: CARAFATE Take 10 mLs (1 g total) by mouth 4 (four) times daily -  with meals and at bedtime.   thiamine 100 MG tablet Take 1 tablet (100 mg total) by mouth daily.            Discharge Care Instructions  (From admission, onward)         Start     Ordered   04/08/20 0000  Discharge wound care:       Comments: Apply Santyl to medial gluteal fold wound with yellow slough in Onofre Gains nickel thick layer. Cover  with Layonna Dobie saline moistened gauze, then dry gauze or ABD pad.  Change daily.   04/08/20 1341   04/08/20 0000  Discharge wound care:       Comments: Apply Santyl to medial gluteal fold wound with yellow slough in Kacelyn Rowzee nickel thick layer. Cover with Dhanvi Boesen saline moistened gauze, then dry gauze or ABD pad.  Change daily.  Evaluate perineal area   04/08/20 1341         Allergies  Allergen Reactions  . Oxycodone Hcl Nausea And Vomiting  . Penicillins Itching  Has patient had Eryn Marandola PCN reaction causing immediate rash, facial/tongue/throat swelling, SOB or lightheadedness with hypotension: No Has patient had Khyron Garno PCN reaction causing severe rash involving mucus membranes or skin necrosis: No Has patient had Jakeya Gherardi PCN reaction that required hospitalization: No Has patient had Coni Homesley PCN reaction occurring within the last 10 years: No If all of the above answers are "NO", then may proceed with Cephalosporin use.    Contact information for after-discharge care    Destination    [facility] .   Service: Skilled Nursing Contact information: [place] [phone]                   The results of significant diagnostics from this hospitalization (including imaging, microbiology, ancillary and laboratory) are listed below for reference.    Significant Diagnostic Studies: DG Chest 2 View  Result Date: 03/28/2020 CLINICAL DATA:  Chest pain EXAM: CHEST - 2 VIEW COMPARISON:  03/26/2020 FINDINGS: Cardiac shadow is within normal limits. The lungs are well aerated bilaterally. No focal infiltrate or sizable effusion is seen. Mild right basilar atelectasis is again noted. No bony abnormality is seen. IMPRESSION: Mild right basilar atelectasis. Electronically Signed   By: Inez Catalina M.D.   On: 03/28/2020 10:28   DG Knee 1-2 Views Left  Result Date: 03/31/2020 CLINICAL DATA:  BILATERAL knee pain and swelling, lymphedema EXAM: LEFT KNEE - 1-2 VIEW  COMPARISON:  02/14/2018 FINDINGS: Osseous demineralization. Tricompartmental osteoarthritic changes with joint space narrowing and spur formation. Minimal chondrocalcinosis question CPPD. Small joint effusion. No acute fracture, dislocation, or bone destruction. IMPRESSION: Advanced tricompartmental osteoarthritic changes and suspect CPPD of LEFT knee. Osseous demineralization. Electronically Signed   By: Lavonia Dana M.D.   On: 03/31/2020 15:34   DG Knee 1-2 Views Right  Result Date: 03/31/2020 CLINICAL DATA:  BILATERAL knee pain and swelling, lymphedema EXAM: RIGHT KNEE - 1-2 VIEW COMPARISON:  02/14/2018 FINDINGS: Osseous demineralization. Tricompartmental joint space narrowing and spur formation. Scattered chondrocalcinosis question CPPD. Small joint effusion. No acute fracture, dislocation, or bone destruction. Atherosclerotic calcifications at runoff vessels. IMPRESSION: Advanced tricompartmental osteoarthritic changes and suspect CPPD of RIGHT knee. Osseous demineralization. Electronically Signed   By: Lavonia Dana M.D.   On: 03/31/2020 15:33   CT Head Wo Contrast  Result Date: 03/17/2020 CLINICAL DATA:  Mental status change. EXAM: CT HEAD WITHOUT CONTRAST TECHNIQUE: Contiguous axial images were obtained from the base of the skull through the vertex without intravenous contrast. COMPARISON:  October 16, 2017. FINDINGS: Brain: No evidence of acute infarction, hemorrhage, hydrocephalus, extra-axial collection or mass lesion/mass effect. Age related atrophy and chronic microvascular ischemic changes are noted. Vascular: No hyperdense vessel or unexpected calcification. Skull: Normal. Negative for fracture or focal lesion. Sinuses/Orbits: There is mild mucosal thickening of the bilateral maxillary sinuses. The remaining paranasal sinuses and mastoid air cells are essentially clear. Other: None. IMPRESSION: 1. No acute intracranial abnormality. 2. Age related atrophy and chronic microvascular ischemic changes  are noted. Electronically Signed   By: Constance Holster M.D.   On: 03/17/2020 03:10   CT CERVICAL SPINE WO CONTRAST  Result Date: 03/20/2020 CLINICAL DATA:  Myelopathy EXAM: CT CERVICAL SPINE WITHOUT CONTRAST TECHNIQUE: Multidetector CT imaging of the cervical spine was performed without intravenous contrast. Multiplanar CT image reconstructions were also generated. COMPARISON:  Cervical spine MRI 03/20/2020 FINDINGS: Alignment: Grade 1 retrolisthesis at C3-4 and C4-5. Skull base and vertebrae: No acute fracture. No primary bone lesion or focal pathologic  process. Soft tissues and spinal canal: No prevertebral fluid or swelling. No visible canal hematoma. Disc levels: C2-3: Spondylosis without high-grade spinal canal or neural foraminal stenosis. C3-4: Large uncovertebral osteophytes with severe bilateral foraminal stenosis. Moderate spinal canal stenosis. C4-5: Uncovertebral osteophytes with severe bilateral foraminal stenosis. Moderate spinal canal stenosis. C5-6: Disc space narrowing with small uncovertebral osteophytes. No spinal canal stenosis. Mild foraminal stenosis bilaterally. C6-7: No spinal canal or neural foraminal stenosis. Upper chest: Negative. Other: None. IMPRESSION: 1. Moderate spinal canal stenosis and severe bilateral foraminal stenosis at C3-4 and C4-5. 2. No acute fracture or static subluxation of the cervical spine. Electronically Signed   By: Ulyses Jarred M.D.   On: 03/20/2020 19:09   MR BRAIN WO CONTRAST  Result Date: 03/18/2020 CLINICAL DATA:  Mental status change, unknown cause. EXAM: MRI HEAD WITHOUT CONTRAST TECHNIQUE: Multiplanar, multiecho pulse sequences of the brain and surrounding structures were obtained without intravenous contrast. COMPARISON:  Prior head CT examinations 03/17/2020 and earlier. FINDINGS: Brain: Mild cerebral and cerebellar atrophy. Diffusion-weighted and T2/FLAIR hyperintense signal abnormality within the medial thalami, periaqueductal gray matter  and dorsal medulla (for instance as seen on series 5, images 79-81 and image 74) series 9, images 14 and 11) (series 9, image 6). Mild ill-defined and scattered T2/FLAIR hyperintensity within the cerebral white matter is nonspecific, but compatible with chronic small vessel ischemic disease. No evidence of intracranial mass. No chronic intracranial blood products. No extra-axial fluid collection. No midline shift. Vascular: Expected proximal arterial flow voids. Skull and upper cervical spine: No focal marrow lesion. Cervical spondylosis. 2 mm C2-C3 grade 1 anterolisthesis. 5 mm C3-C4 grade 1 retrolisthesis which contributes to suspected at least moderate spinal canal stenosis and contacts the ventral spinal cord. Poetry Cerro C4-C5 posterior disc osteophyte complex also contributes to suspected at least moderate spinal canal stenosis. Sinuses/Orbits: Visualized orbits show no acute finding. Moderate-sized right maxillary sinus mucous retention cyst. Trace ethmoid and maxillary sinus mucosal thickening. Impression #2 will be called to the ordering clinician or representative by the Radiologist Assistant, and communication documented in the PACS or [employer]. IMPRESSION: Diffusion-weighted and T2/FLAIR hyperintense signal abnormality within the medial thalami, periaqueductal gray matter and dorsal medulla. These findings are highly suggestive of Wernicke encephalopathy. Mild generalized parenchymal atrophy and chronic small vessel ischemic disease. Cervical spondylosis. At C3-C4, grade 1 retrolisthesis contributes to suspected at least moderate spinal canal stenosis and contacts the ventral spinal cord. At least moderate spinal canal stenosis is also suspected at C4-C5. Moderate-sized right maxillary sinus mucous retention cyst. Electronically Signed   By: Kellie Simmering DO   On: 03/18/2020 16:36   MR CERVICAL SPINE WO CONTRAST  Result Date: 03/20/2020 CLINICAL DATA:  Cervical radiculopathy rule out infection EXAM:  MRI CERVICAL SPINE WITHOUT CONTRAST TECHNIQUE: Multiplanar, multisequence MR imaging of the cervical spine was performed. No intravenous contrast was administered. COMPARISON:  None. FINDINGS: Alignment: Image quality degraded by extensive motion. The patient was not able to complete the study or hold still. 3 mm retrolisthesis C3-4 and C4-5.  Mild retrolisthesis C5-6. Vertebrae: Negative for fracture or mass. No bone marrow edema is present Cord: Cord evaluation limited by extensive motion. Posterior Fossa, vertebral arteries, paraspinal tissues: No soft tissue mass or fluid collection Disc levels: C2-3: Mild disc and facet degeneration.  Mild spinal stenosis C3-4: Disc degeneration with posterior spurring. Moderate spinal stenosis and moderate to severe foraminal encroachment bilaterally C4-5: Disc degeneration and spurring. Moderate spinal stenosis. Severe foraminal stenosis bilaterally C5-6: Disc degeneration and spurring.  Mild spinal stenosis and moderate to severe foraminal stenosis bilaterally C6-7: Mild degenerative change.  Negative for stenosis C7-T1: Mild degenerative change.  Negative for stenosis. IMPRESSION: Limited study. The patient was not able to hold still or complete the study. Multilevel spondylosis causing significant spinal and foraminal stenosis at multiple levels. Recommend CT cervical spine for further evaluation. Electronically Signed   By: Franchot Gallo M.D.   On: 03/20/2020 17:00   MR PELVIS WO CONTRAST  Result Date: 03/28/2020 CLINICAL DATA:  Rectal abscess. EXAM: MRI PELVIS WITHOUT CONTRAST TECHNIQUE: Multiplanar multisequence MR imaging of the pelvis was performed. No intravenous contrast was administered. COMPARISON:  None. FINDINGS: Study is markedly limited by lack of intravenous contrast material. Areas of infection/inflammation could be occult. Urinary Tract:  Bladder is nondistended. Bowel: Diverticular changes are noted in the sigmoid colon. No perirectal fistula or  abscess evident on this noncontrast study. Vascular/Lymphatic: No pathologically enlarged lymph nodes along the pelvic sidewalls. There is mild lymphadenopathy in both groin regions. No significant vascular abnormality seen. Reproductive: Uterus obscured by motion artifact on sagittal imaging. No gross endometrial thickening. No adnexal mass. 2.0 cm cystic lesion at the posterolateral aspect of the right vaginal introitus is in Pegi Milazzo characteristic location for Bartholin's cyst. Other: Trace free fluid noted in the cul-de-sac. No suspicious marrow signal abnormality. Musculoskeletal: There is edema in the soft tissues of the pelvic floor, along the abductor muscles bilaterally, and in the soft tissues of the perineum. IMPRESSION: 1. No evidence for perirectal fistula or abscess. Assessment limited by lack of intravenous contrast material. 2. Bartholin's cyst noted at the vaginal introitus. 3. Soft tissue edema noted in the pelvic floor, adductor musculature bilaterally, and in the soft tissues of the perineum. These changes are associated with mild bilateral groin lymphadenopathy. Soft tissue infection not excluded. Follow-up recommended to ensure resolution of lymphadenopathy. Electronically Signed   By: Misty Stanley M.D.   On: 03/28/2020 09:56   CT HIP RIGHT WO CONTRAST  Result Date: 03/31/2020 CLINICAL DATA:  Right hip pain, fever EXAM: CT OF THE RIGHT HIP WITHOUT CONTRAST TECHNIQUE: Multidetector CT imaging of the right hip was performed according to the standard protocol. Multiplanar CT image reconstructions were also generated. COMPARISON:  MRI 03/28/2020, CT 02/23/2020, 01/06/2019 FINDINGS: Bones/Joint/Cartilage No acute fracture. No dislocation. Moderate osteoarthritis of the right hip as manifested by joint space narrowing, subchondral sclerosis/cystic change, and marginal osteophyte formation. Small right hip joint effusion, nonspecific (series 7, image 41). No evidence of femoral head avascular  necrosis. Degenerative changes of the pubic symphysis with chondrocalcinosis. No lytic or sclerotic bone lesion identified. Ligaments Suboptimally assessed by CT. Muscles and Tendons No acute musculotendinous abnormality by CT. No intramuscular fluid collection. Soft tissues Mild induration within the subcutaneous fat of the infra gluteal soft tissues. No soft tissue fluid collection or abscess. Multiple mildly enlarged right inguinal lymph nodes, largest measuring up to 1.7 cm short axis (series 7, image 86). There are partially visualized mildly enlarged left inguinal lymph nodes. Inguinal nodes are similar in size to previous CT. Scattered areas of nodularity within the subcutaneous tissues of the lower anterior abdominal wall likely reflecting injection granulomas. Diverticular change within the visualized sigmoid colon. Moderate volume of stool within the rectosigmoid colon. No acute findings within the visualized portion of the pelvis. IMPRESSION: 1. No acute osseous abnormality of the right hip. 2. Mild nonspecific induration within the subcutaneous fat. No soft tissue fluid collection or abscess 3. Moderate osteoarthritis of the right hip with small  right hip joint effusion. Effusion was present on multiple previous studies including previous CT from 01/06/2019 and is likely reactive secondary to osteoarthritis. However, if there is Teng Decou high clinical suspicion for septic arthritis, arthrocentesis should be considered. 4. Multiple mildly enlarged bilateral inguinal lymph nodes are similar to previous studies. 5. Moderate volume of stool within the rectosigmoid colon. Electronically Signed   By: Duanne Guess D.O.   On: 03/31/2020 09:13   NM Pulmonary Perf and Vent  Result Date: 03/28/2020 CLINICAL DATA:  Positive D-dimer. Fever. Assess for pulmonary emboli. EXAM: NUCLEAR MEDICINE PERFUSION LUNG SCAN TECHNIQUE: Perfusion images were obtained in multiple projections after intravenous injection of  radiopharmaceutical. Ventilation scans intentionally deferred if perfusion scan and chest x-ray adequate for interpretation during COVID 19 epidemic. RADIOPHARMACEUTICALS:  5.12 mCi Tc-87m MAA IV COMPARISON:  Chest radiography same day FINDINGS: Perfusion study appears normal. No defects to suggest pulmonary emboli. IMPRESSION: Normal perfusion study.  No defects to suggest pulmonary emboli. Electronically Signed   By: Paulina Fusi M.D.   On: 03/28/2020 11:32   DG CHEST PORT 1 VIEW  Result Date: 03/26/2020 CLINICAL DATA:  Sudden onset of fever. EXAM: PORTABLE CHEST 1 VIEW COMPARISON:  Chest x-ray 03/16/2020. FINDINGS: Mediastinum and hilar structures normal. Heart size normal. Low lung volumes with bibasilar atelectasis/infiltrates. No pleural effusion or pneumothorax. Degenerative change thoracic spine. IMPRESSION: Low lung volumes with bibasilar atelectasis/infiltrates. Electronically Signed   By: Maisie Fus  Register   On: 03/26/2020 15:06   DG Chest Port 1 View  Result Date: 03/16/2020 CLINICAL DATA:  Altered mental status, unresponsive EXAM: PORTABLE CHEST 1 VIEW COMPARISON:  Chest x-ray FINDINGS: The heart size and mediastinal contours are within normal limits. No focal consolidation. No pulmonary edema. No pleural effusion. No pneumothorax. No acute osseous abnormality. IMPRESSION: No active disease. Electronically Signed   By: Tish Frederickson M.D.   On: 03/16/2020 23:59   DG HIP UNILAT WITH PELVIS 2-3 VIEWS LEFT  Result Date: 03/17/2020 CLINICAL DATA:  Left hip pain. EXAM: DG HIP (WITH OR WITHOUT PELVIS) 2-3V LEFT COMPARISON:  None. FINDINGS: There is no evidence of hip fracture or dislocation. Moderate osteophyte formation of left hip is noted. IMPRESSION: Moderate osteoarthritis of left hip. No acute abnormality seen. Electronically Signed   By: Lupita Raider M.D.   On: 03/17/2020 12:52   VAS [place] LOWER EXTREMITY VENOUS (DVT)  Result Date: 03/27/2020  Lower Venous DVT Study Indications:  FUO.  Risk Factors: None identified. Limitations: Body habitus, poor ultrasound/tissue interface and patient pain tolerance, patient positioning. Comparison Study: No prior studies. Performing Technologist: Chanda Busing RVT  Examination Guidelines: Hady Niemczyk complete evaluation includes B-mode imaging, spectral Doppler, color Doppler, and power Doppler as needed of all accessible portions of each vessel. Bilateral testing is considered an integral part of Deklyn Gibbon complete examination. Limited examinations for reoccurring indications may be performed as noted. The reflux portion of the exam is performed with the patient in reverse Trendelenburg.  +---------+---------------+---------+-----------+----------+-------------------+ RIGHT    CompressibilityPhasicitySpontaneityPropertiesThrombus Aging      +---------+---------------+---------+-----------+----------+-------------------+ CFV      Full           Yes      Yes                                      +---------+---------------+---------+-----------+----------+-------------------+ SFJ      Full                                                             +---------+---------------+---------+-----------+----------+-------------------+  FV Prox  Full                                                             +---------+---------------+---------+-----------+----------+-------------------+ FV Mid   Full                                                             +---------+---------------+---------+-----------+----------+-------------------+ FV DistalFull                                                             +---------+---------------+---------+-----------+----------+-------------------+ PFV      Full                                                             +---------+---------------+---------+-----------+----------+-------------------+ POP      Full           Yes      Yes                                       +---------+---------------+---------+-----------+----------+-------------------+ PTV      Full                                                             +---------+---------------+---------+-----------+----------+-------------------+ PERO                                                  Not well visualized +---------+---------------+---------+-----------+----------+-------------------+   +---------+---------------+---------+-----------+----------+-------------------+ LEFT     CompressibilityPhasicitySpontaneityPropertiesThrombus Aging      +---------+---------------+---------+-----------+----------+-------------------+ CFV      Full           Yes      Yes                                      +---------+---------------+---------+-----------+----------+-------------------+ SFJ      Full                                                             +---------+---------------+---------+-----------+----------+-------------------+ FV Prox  Full                                                             +---------+---------------+---------+-----------+----------+-------------------+  FV Mid   Full                                                             +---------+---------------+---------+-----------+----------+-------------------+ FV Distal               Yes      Yes                                      +---------+---------------+---------+-----------+----------+-------------------+ PFV      Full                                                             +---------+---------------+---------+-----------+----------+-------------------+ POP      Full           Yes      Yes                                      +---------+---------------+---------+-----------+----------+-------------------+ PTV      Full                                                             +---------+---------------+---------+-----------+----------+-------------------+  PERO                                                  Not well visualized +---------+---------------+---------+-----------+----------+-------------------+     Summary: RIGHT: - There is no evidence of deep vein thrombosis in the lower extremity. However, portions of this examination were limited- see technologist comments above.  - No cystic structure found in the popliteal fossa.  LEFT: - There is no evidence of deep vein thrombosis in the lower extremity. However, portions of this examination were limited- see technologist comments above.  - No cystic structure found in the popliteal fossa.  *See table(s) above for measurements and observations. Electronically signed by Curt Jews MD on 03/27/2020 at 8:02:40 PM.    Final     Microbiology: Recent Results (from the past 240 hour(s))  Culture, blood (Routine X 2) w Reflex to ID Panel     Status: None   Collection Time: 03/31/20  6:22 AM   Specimen: BLOOD RIGHT FOREARM  Result Value Ref Range Status   Specimen Description BLOOD RIGHT FOREARM  Final   Special Requests   Final    BOTTLES DRAWN AEROBIC ONLY Blood Culture adequate volume   Culture   Final    NO GROWTH 5 DAYS Performed at [facility], [address] [place], [place] [address]    Report Status 04/05/2020 FINAL  Final  Culture, blood (Routine X 2) w Reflex to ID  Panel     Status: None   Collection Time: 03/31/20  6:22 AM   Specimen: BLOOD RIGHT WRIST  Result Value Ref Range Status   Specimen Description BLOOD RIGHT WRIST  Final   Special Requests   Final    BOTTLES DRAWN AEROBIC ONLY Blood Culture results may not be optimal due to an inadequate volume of blood received in culture bottles   Culture   Final    NO GROWTH 5 DAYS Performed at [facility], [address] [place], [place] [address]    Report Status 04/05/2020 FINAL  Final  Body fluid culture     Status: None   Collection Time: 04/04/20  4:00 PM   Specimen: Body Fluid  Result Value Ref Range  Status   Specimen Description FLUID RIGHT KNEE  Final   Special Requests NONE  Final   Gram Stain   Final    RARE WBC PRESENT,BOTH PMN AND MONONUCLEAR NO ORGANISMS SEEN    Culture   Final    NO GROWTH 3 DAYS Performed at [facility], [address], [place], [place] [address]    Report Status 04/08/2020 FINAL  Final  Resp Panel by RT-PCR (Flu Josel Keo&B, Covid) Nasopharyngeal Swab     Status: None   Collection Time: 04/06/20 12:10 PM   Specimen: Nasopharyngeal Swab; Nasopharyngeal(NP) swabs in vial transport medium  Result Value Ref Range Status   SARS Coronavirus 2 by RT PCR NEGATIVE NEGATIVE Final    Comment: (NOTE) SARS-CoV-2 target nucleic acids are NOT DETECTED.  The SARS-CoV-2 RNA is generally detectable in upper respiratory specimens during the acute phase of infection. The lowest concentration of SARS-CoV-2 viral copies this assay can detect is 138 copies/mL. Danielly Ackerley negative result does not preclude SARS-Cov-2 infection and should not be used as the sole basis for treatment or other patient management decisions. Gabryela Kimbrell negative result may occur with  improper specimen collection/handling, submission of specimen other than nasopharyngeal swab, presence of viral mutation(s) within the areas targeted by this assay, and inadequate number of viral copies(<138 copies/mL). Virgil Slinger negative result must be combined with clinical observations, patient history, and epidemiological information. The expected result is Negative.  Fact Sheet for Patients:  EntrepreneurPulse.com.au  Fact Sheet for Healthcare Providers:  IncredibleEmployment.be  This test is no t yet approved or cleared by the [place] FDA and  has been authorized for detection and/or diagnosis of SARS-CoV-2 by FDA under an Emergency Use Authorization (EUA). This EUA will remain  in effect (meaning this test can be used) for the duration of the COVID-19 declaration under Section 564(b)(1) of  the Act, 21 U.S.C.section 360bbb-3(b)(1), unless the authorization is terminated  or revoked sooner.       Influenza Kamar Callender by PCR NEGATIVE NEGATIVE Final   Influenza B by PCR NEGATIVE NEGATIVE Final    Comment: (NOTE) The Xpert Xpress SARS-CoV-2/FLU/RSV plus assay is intended as an aid in the diagnosis of influenza from Nasopharyngeal swab specimens and should not be used as Denzal Meir sole basis for treatment. Nasal washings and aspirates are unacceptable for Xpert Xpress SARS-CoV-2/FLU/RSV testing.  Fact Sheet for Patients: EntrepreneurPulse.com.au  Fact Sheet for Healthcare Providers: IncredibleEmployment.be  This test is not yet approved or cleared by the [place] FDA and has been authorized for detection and/or diagnosis of SARS-CoV-2 by FDA under an Emergency Use Authorization (EUA). This EUA will remain in effect (meaning this test can be used) for the duration of the COVID-19 declaration under Section 564(b)(1)  of the Act, 21 U.S.C. section 360bbb-3(b)(1), unless the authorization is terminated or revoked.  Performed at [facility], [place] [address] [place], [place] [address]      Labs: Basic Metabolic Panel: Recent Labs  Lab 04/03/20 0045 04/04/20 0035 04/05/20 0124 04/06/20 0224 04/07/20 0126 04/08/20 0844  NA 140 139 141 140 140 141  K 4.0 3.6 3.6 3.7 3.6 3.3*  CL 106 105 105 105 106 104  CO2 25 25 25 25 24 25   GLUCOSE 166* 108* 136* 196* 217* 118*  BUN 18 16 16 20  24* 27*  CREATININE 1.06* 0.98 1.08* 0.92 1.17* 1.00  CALCIUM 8.5* 8.7* 8.7* 8.6* 8.7* 8.7*  MG 1.9  --  1.7 1.7 1.8  --   PHOS  --   --  2.7 2.8 2.7  --    Liver Function Tests: Recent Labs  Lab 04/04/20 0035 04/05/20 0124 04/06/20 0224 04/07/20 0126 04/08/20 0844  AST 98* 75* 48* 76* 63*  ALT 63* 60* 53* 66* 72*  ALKPHOS 118 120 105 102 95  BILITOT 0.3 0.4 0.3 0.2* 0.6  PROT 5.7* 6.1* 5.9* 6.1* 5.7*  ALBUMIN 1.6* 1.8* 1.9* 2.1* 2.1*   No  results for input(s): LIPASE, AMYLASE in the last 168 hours. No results for input(s): AMMONIA in the last 168 hours. CBC: Recent Labs  Lab 04/04/20 0035 04/05/20 0124 04/06/20 0224 04/07/20 0126 04/07/20 1747 04/08/20 0844  WBC 7.4 7.3 10.5 14.1*  --  13.9*  NEUTROABS 3.5 3.8 8.3* 10.2*  --  8.4*  HGB 7.3* 8.0* 7.9* 7.7* 8.8* 8.4*  HCT 22.6* 26.7* 25.9* 25.7* 27.5* 26.5*  MCV 81.3 83.7 82.5 84.0  --  82.3  PLT 977* 903* 924* 956*  --  879*   Cardiac Enzymes: No results for input(s): CKTOTAL, CKMB, CKMBINDEX, TROPONINI in the last 168 hours. BNP: BNP (last 3 results) No results for input(s): BNP in the last 8760 hours.  ProBNP (last 3 results) No results for input(s): PROBNP in the last 8760 hours.  CBG: Recent Labs  Lab 04/07/20 0749 04/07/20 1153 04/07/20 2130 04/08/20 0740 04/08/20 1148  GLUCAP 106* 135* 192* 112* 190*       Signed:  Fayrene Helper MD.  [facility] 04/08/2020, 3:43 PM

## 2020-04-10 DIAGNOSIS — L89153 Pressure ulcer of sacral region, stage 3: Secondary | ICD-10-CM | POA: Diagnosis not present

## 2020-04-10 DIAGNOSIS — E440 Moderate protein-calorie malnutrition: Secondary | ICD-10-CM | POA: Diagnosis not present

## 2020-04-16 DIAGNOSIS — G9340 Encephalopathy, unspecified: Secondary | ICD-10-CM | POA: Diagnosis not present

## 2020-04-17 DIAGNOSIS — E119 Type 2 diabetes mellitus without complications: Secondary | ICD-10-CM | POA: Diagnosis not present

## 2020-04-17 DIAGNOSIS — N1831 Chronic kidney disease, stage 3a: Secondary | ICD-10-CM | POA: Diagnosis not present

## 2020-04-17 DIAGNOSIS — M1990 Unspecified osteoarthritis, unspecified site: Secondary | ICD-10-CM | POA: Diagnosis not present

## 2020-04-17 DIAGNOSIS — I10 Essential (primary) hypertension: Secondary | ICD-10-CM | POA: Diagnosis not present

## 2020-04-17 DIAGNOSIS — M109 Gout, unspecified: Secondary | ICD-10-CM | POA: Diagnosis not present

## 2020-04-17 DIAGNOSIS — E440 Moderate protein-calorie malnutrition: Secondary | ICD-10-CM | POA: Diagnosis not present

## 2020-04-17 DIAGNOSIS — G9340 Encephalopathy, unspecified: Secondary | ICD-10-CM | POA: Diagnosis not present

## 2020-04-17 DIAGNOSIS — I890 Lymphedema, not elsewhere classified: Secondary | ICD-10-CM | POA: Diagnosis not present

## 2020-04-17 DIAGNOSIS — L89153 Pressure ulcer of sacral region, stage 3: Secondary | ICD-10-CM | POA: Diagnosis not present

## 2020-04-17 DIAGNOSIS — S81802D Unspecified open wound, left lower leg, subsequent encounter: Secondary | ICD-10-CM | POA: Diagnosis not present

## 2020-04-17 DIAGNOSIS — D649 Anemia, unspecified: Secondary | ICD-10-CM | POA: Diagnosis not present

## 2020-04-19 DIAGNOSIS — M545 Low back pain, unspecified: Secondary | ICD-10-CM | POA: Diagnosis not present

## 2020-04-19 DIAGNOSIS — M109 Gout, unspecified: Secondary | ICD-10-CM | POA: Diagnosis not present

## 2020-04-19 DIAGNOSIS — I890 Lymphedema, not elsewhere classified: Secondary | ICD-10-CM | POA: Diagnosis not present

## 2020-04-19 DIAGNOSIS — I10 Essential (primary) hypertension: Secondary | ICD-10-CM | POA: Diagnosis not present

## 2020-04-19 DIAGNOSIS — E119 Type 2 diabetes mellitus without complications: Secondary | ICD-10-CM | POA: Diagnosis not present

## 2020-04-19 DIAGNOSIS — N1831 Chronic kidney disease, stage 3a: Secondary | ICD-10-CM | POA: Diagnosis not present

## 2020-04-19 DIAGNOSIS — M1990 Unspecified osteoarthritis, unspecified site: Secondary | ICD-10-CM | POA: Diagnosis not present

## 2020-04-19 DIAGNOSIS — D649 Anemia, unspecified: Secondary | ICD-10-CM | POA: Diagnosis not present

## 2020-04-24 DIAGNOSIS — S81802D Unspecified open wound, left lower leg, subsequent encounter: Secondary | ICD-10-CM | POA: Diagnosis not present

## 2020-04-24 DIAGNOSIS — E119 Type 2 diabetes mellitus without complications: Secondary | ICD-10-CM | POA: Diagnosis not present

## 2020-04-24 DIAGNOSIS — E440 Moderate protein-calorie malnutrition: Secondary | ICD-10-CM | POA: Diagnosis not present

## 2020-04-24 DIAGNOSIS — L89153 Pressure ulcer of sacral region, stage 3: Secondary | ICD-10-CM | POA: Diagnosis not present

## 2020-04-29 DIAGNOSIS — I10 Essential (primary) hypertension: Secondary | ICD-10-CM | POA: Diagnosis not present

## 2020-04-29 DIAGNOSIS — E119 Type 2 diabetes mellitus without complications: Secondary | ICD-10-CM | POA: Diagnosis not present

## 2020-04-29 DIAGNOSIS — N1831 Chronic kidney disease, stage 3a: Secondary | ICD-10-CM | POA: Diagnosis not present

## 2020-04-29 DIAGNOSIS — D649 Anemia, unspecified: Secondary | ICD-10-CM | POA: Diagnosis not present

## 2020-04-29 DIAGNOSIS — M545 Low back pain, unspecified: Secondary | ICD-10-CM | POA: Diagnosis not present

## 2020-04-29 DIAGNOSIS — I890 Lymphedema, not elsewhere classified: Secondary | ICD-10-CM | POA: Diagnosis not present

## 2020-04-29 DIAGNOSIS — M1990 Unspecified osteoarthritis, unspecified site: Secondary | ICD-10-CM | POA: Diagnosis not present

## 2020-04-29 DIAGNOSIS — R030 Elevated blood-pressure reading, without diagnosis of hypertension: Secondary | ICD-10-CM | POA: Diagnosis not present

## 2020-05-01 DIAGNOSIS — S81802D Unspecified open wound, left lower leg, subsequent encounter: Secondary | ICD-10-CM | POA: Diagnosis not present

## 2020-05-01 DIAGNOSIS — L89153 Pressure ulcer of sacral region, stage 3: Secondary | ICD-10-CM | POA: Diagnosis not present

## 2020-05-01 DIAGNOSIS — E440 Moderate protein-calorie malnutrition: Secondary | ICD-10-CM | POA: Diagnosis not present

## 2020-05-01 DIAGNOSIS — E119 Type 2 diabetes mellitus without complications: Secondary | ICD-10-CM | POA: Diagnosis not present

## 2020-05-10 DIAGNOSIS — I517 Cardiomegaly: Secondary | ICD-10-CM | POA: Diagnosis not present

## 2020-05-10 DIAGNOSIS — R918 Other nonspecific abnormal finding of lung field: Secondary | ICD-10-CM | POA: Diagnosis not present

## 2020-05-13 DIAGNOSIS — N1831 Chronic kidney disease, stage 3a: Secondary | ICD-10-CM | POA: Diagnosis not present

## 2020-05-13 DIAGNOSIS — I890 Lymphedema, not elsewhere classified: Secondary | ICD-10-CM | POA: Diagnosis not present

## 2020-05-13 DIAGNOSIS — M545 Low back pain, unspecified: Secondary | ICD-10-CM | POA: Diagnosis not present

## 2020-05-13 DIAGNOSIS — I10 Essential (primary) hypertension: Secondary | ICD-10-CM | POA: Diagnosis not present

## 2020-05-13 DIAGNOSIS — M109 Gout, unspecified: Secondary | ICD-10-CM | POA: Diagnosis not present

## 2020-05-13 DIAGNOSIS — D649 Anemia, unspecified: Secondary | ICD-10-CM | POA: Diagnosis not present

## 2020-05-13 DIAGNOSIS — M1990 Unspecified osteoarthritis, unspecified site: Secondary | ICD-10-CM | POA: Diagnosis not present

## 2020-05-20 ENCOUNTER — Ambulatory Visit: Payer: PPO | Admitting: Internal Medicine

## 2020-05-22 DIAGNOSIS — E119 Type 2 diabetes mellitus without complications: Secondary | ICD-10-CM | POA: Diagnosis not present

## 2020-05-22 DIAGNOSIS — L89153 Pressure ulcer of sacral region, stage 3: Secondary | ICD-10-CM | POA: Diagnosis not present

## 2020-05-22 DIAGNOSIS — S81802D Unspecified open wound, left lower leg, subsequent encounter: Secondary | ICD-10-CM | POA: Diagnosis not present

## 2020-05-22 DIAGNOSIS — E440 Moderate protein-calorie malnutrition: Secondary | ICD-10-CM | POA: Diagnosis not present

## 2020-05-23 ENCOUNTER — Other Ambulatory Visit: Payer: Self-pay | Admitting: Internal Medicine

## 2020-05-23 DIAGNOSIS — E119 Type 2 diabetes mellitus without complications: Secondary | ICD-10-CM | POA: Diagnosis not present

## 2020-05-23 DIAGNOSIS — U071 COVID-19: Secondary | ICD-10-CM | POA: Diagnosis not present

## 2020-05-23 DIAGNOSIS — N1831 Chronic kidney disease, stage 3a: Secondary | ICD-10-CM | POA: Diagnosis not present

## 2020-05-23 DIAGNOSIS — M1990 Unspecified osteoarthritis, unspecified site: Secondary | ICD-10-CM | POA: Diagnosis not present

## 2020-05-23 DIAGNOSIS — I890 Lymphedema, not elsewhere classified: Secondary | ICD-10-CM | POA: Diagnosis not present

## 2020-05-23 DIAGNOSIS — D649 Anemia, unspecified: Secondary | ICD-10-CM | POA: Diagnosis not present

## 2020-05-23 DIAGNOSIS — I10 Essential (primary) hypertension: Secondary | ICD-10-CM | POA: Diagnosis not present

## 2020-05-24 ENCOUNTER — Ambulatory Visit: Payer: PPO | Admitting: Podiatry

## 2020-05-24 ENCOUNTER — Other Ambulatory Visit: Payer: Self-pay | Admitting: Internal Medicine

## 2020-05-29 DIAGNOSIS — S81802D Unspecified open wound, left lower leg, subsequent encounter: Secondary | ICD-10-CM | POA: Diagnosis not present

## 2020-05-29 DIAGNOSIS — E440 Moderate protein-calorie malnutrition: Secondary | ICD-10-CM | POA: Diagnosis not present

## 2020-05-29 DIAGNOSIS — L89153 Pressure ulcer of sacral region, stage 3: Secondary | ICD-10-CM | POA: Diagnosis not present

## 2020-05-29 DIAGNOSIS — E119 Type 2 diabetes mellitus without complications: Secondary | ICD-10-CM | POA: Diagnosis not present

## 2020-06-03 DIAGNOSIS — E119 Type 2 diabetes mellitus without complications: Secondary | ICD-10-CM | POA: Diagnosis not present

## 2020-06-03 DIAGNOSIS — N1831 Chronic kidney disease, stage 3a: Secondary | ICD-10-CM | POA: Diagnosis not present

## 2020-06-03 DIAGNOSIS — Q820 Hereditary lymphedema: Secondary | ICD-10-CM | POA: Diagnosis not present

## 2020-06-03 DIAGNOSIS — I82431 Acute embolism and thrombosis of right popliteal vein: Secondary | ICD-10-CM | POA: Diagnosis not present

## 2020-06-03 DIAGNOSIS — R7989 Other specified abnormal findings of blood chemistry: Secondary | ICD-10-CM | POA: Diagnosis not present

## 2020-06-03 DIAGNOSIS — I82491 Acute embolism and thrombosis of other specified deep vein of right lower extremity: Secondary | ICD-10-CM | POA: Diagnosis not present

## 2020-06-03 DIAGNOSIS — Z79899 Other long term (current) drug therapy: Secondary | ICD-10-CM | POA: Diagnosis not present

## 2020-06-03 DIAGNOSIS — I10 Essential (primary) hypertension: Secondary | ICD-10-CM | POA: Diagnosis not present

## 2020-06-05 DIAGNOSIS — E440 Moderate protein-calorie malnutrition: Secondary | ICD-10-CM | POA: Diagnosis not present

## 2020-06-05 DIAGNOSIS — L89153 Pressure ulcer of sacral region, stage 3: Secondary | ICD-10-CM | POA: Diagnosis not present

## 2020-06-05 DIAGNOSIS — E119 Type 2 diabetes mellitus without complications: Secondary | ICD-10-CM | POA: Diagnosis not present

## 2020-06-05 DIAGNOSIS — S81802D Unspecified open wound, left lower leg, subsequent encounter: Secondary | ICD-10-CM | POA: Diagnosis not present

## 2020-06-07 ENCOUNTER — Other Ambulatory Visit: Payer: Self-pay | Admitting: Internal Medicine

## 2020-06-07 DIAGNOSIS — M109 Gout, unspecified: Secondary | ICD-10-CM | POA: Diagnosis not present

## 2020-06-07 DIAGNOSIS — R7989 Other specified abnormal findings of blood chemistry: Secondary | ICD-10-CM | POA: Diagnosis not present

## 2020-06-07 DIAGNOSIS — Z8616 Personal history of COVID-19: Secondary | ICD-10-CM | POA: Diagnosis not present

## 2020-06-07 DIAGNOSIS — I890 Lymphedema, not elsewhere classified: Secondary | ICD-10-CM | POA: Diagnosis not present

## 2020-06-11 DIAGNOSIS — G9340 Encephalopathy, unspecified: Secondary | ICD-10-CM | POA: Diagnosis not present

## 2020-06-11 DIAGNOSIS — D649 Anemia, unspecified: Secondary | ICD-10-CM | POA: Diagnosis not present

## 2020-06-11 DIAGNOSIS — R197 Diarrhea, unspecified: Secondary | ICD-10-CM | POA: Diagnosis not present

## 2020-06-12 DIAGNOSIS — E440 Moderate protein-calorie malnutrition: Secondary | ICD-10-CM | POA: Diagnosis not present

## 2020-06-12 DIAGNOSIS — E119 Type 2 diabetes mellitus without complications: Secondary | ICD-10-CM | POA: Diagnosis not present

## 2020-06-12 DIAGNOSIS — L89153 Pressure ulcer of sacral region, stage 3: Secondary | ICD-10-CM | POA: Diagnosis not present

## 2020-06-12 DIAGNOSIS — S81802D Unspecified open wound, left lower leg, subsequent encounter: Secondary | ICD-10-CM | POA: Diagnosis not present

## 2020-06-17 ENCOUNTER — Ambulatory Visit (INDEPENDENT_AMBULATORY_CARE_PROVIDER_SITE_OTHER): Payer: PPO | Admitting: Diagnostic Neuroimaging

## 2020-06-17 ENCOUNTER — Encounter: Payer: Self-pay | Admitting: Diagnostic Neuroimaging

## 2020-06-17 ENCOUNTER — Other Ambulatory Visit: Payer: Self-pay

## 2020-06-17 VITALS — BP 144/78 | HR 72

## 2020-06-17 DIAGNOSIS — M5441 Lumbago with sciatica, right side: Secondary | ICD-10-CM | POA: Diagnosis not present

## 2020-06-17 DIAGNOSIS — G8929 Other chronic pain: Secondary | ICD-10-CM

## 2020-06-17 DIAGNOSIS — R5381 Other malaise: Secondary | ICD-10-CM

## 2020-06-17 DIAGNOSIS — M4802 Spinal stenosis, cervical region: Secondary | ICD-10-CM

## 2020-06-17 DIAGNOSIS — M5442 Lumbago with sciatica, left side: Secondary | ICD-10-CM | POA: Diagnosis not present

## 2020-06-17 DIAGNOSIS — F802 Mixed receptive-expressive language disorder: Secondary | ICD-10-CM

## 2020-06-17 NOTE — Patient Instructions (Signed)
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 Florene Glen 04/08/20)

## 2020-06-17 NOTE — Progress Notes (Signed)
[facility]  PATIENT: [name] [name] DOB: 03-Apr-1946  REFERRING CLINICIAN: Elodia Florence.,* HISTORY FROM: patient  REASON FOR VISIT: new consult    HISTORICAL  CHIEF COMPLAINT:  Chief Complaint  Patient presents with  . Wernicke encephalopathy, cervical stenosis    Rm 6 New Pt, resides at [facility]:   75 year old female here for evaluation of Warnicke's encephalopathy and cervical spinal stenosis.  Patient was living alone in independent living facility, when she started having intractable nausea vomiting in October November 2021.  She was unable to keep food down.  Ultimately she was found home alone, covered in excrement and bugs after not responding for 2 days from her daughter.  Police entered her home and took her to the hospital.  She was diagnosed with Warnicke's encephalopathy and other metabolic derangements.  She was treated medically and transferred to rehabilitation.  Also was found to have cervical spinal stenosis and referred to neurosurgery.    REVIEW OF SYSTEMS: Full 14 system review of systems performed and negative with exception of: As per HPI.  ALLERGIES: Allergies  Allergen Reactions  . Oxycodone Hcl Nausea And Vomiting  . Penicillins Itching    Has patient had a PCN reaction causing immediate rash, facial/tongue/throat swelling, SOB or lightheadedness with hypotension: No Has patient had a PCN reaction causing severe rash involving mucus membranes or skin necrosis: No Has patient had a PCN reaction that required hospitalization: No Has patient had a PCN reaction occurring within the last 10 years: No If all of the above answers are "NO", then may proceed with Cephalosporin use.    HOME MEDICATIONS: Outpatient Medications Prior to Visit  Medication Sig Dispense Refill  . Albuterol Sulfate (PROAIR RESPICLICK) 740 (90 Base) MCG/ACT AEPB Inhale into the lungs. 2 puffs every 6 hours as needed     . apixaban (ELIQUIS) 2.5 MG TABS tablet Take by mouth 2 (two) times daily.    Marland Kitchen dextromethorphan-guaiFENesin (MUCINEX DM) 30-600 MG 12hr tablet Take 1 tablet by mouth 2 (two) times daily.    . diclofenac Sodium (VOLTAREN) 1 % GEL Apply topically 4 (four) times daily.    . folic acid (FOLVITE) 1 MG tablet Take 1 mg by mouth daily.    . metFORMIN (GLUCOPHAGE) 500 MG tablet Take 1 tablet (500 mg total) by mouth daily with breakfast. 90 tablet 3  . METOPROLOL TARTRATE PO Take 25 mg by mouth daily.    . montelukast (SINGULAIR) 10 MG tablet Take 1 tablet (10 mg total) by mouth at bedtime. 90 tablet 3  . Multiple Vitamin (MULTIVITAMIN WITH MINERALS) TABS tablet Take 1 tablet by mouth daily.    . ondansetron (ZOFRAN ODT) 4 MG disintegrating tablet Take 1 tablet (4 mg total) by mouth every 8 (eight) hours as needed for nausea or vomiting. 45 tablet 0  . promethazine (PHENERGAN) 25 MG suppository Place 1 suppository (25 mg total) rectally every 6 (six) hours as needed for nausea or vomiting. 30 each 0  . sucralfate (CARAFATE) 1 GM/10ML suspension Take 10 mLs (1 g total) by mouth 4 (four) times daily -  with meals and at bedtime. 420 mL 0  . thiamine 100 MG tablet Take 1 tablet (100 mg total) by mouth daily.    . collagenase (SANTYL) ointment Apply topically daily. 15 g 0  . feeding supplement, ENSURE COMPLETE, (ENSURE COMPLETE) LIQD Take 237 mLs by mouth 2 (two) times daily between meals. 13272 mL 11  .  metoCLOPramide (REGLAN) 5 MG tablet Take 1 tablet (5 mg total) by mouth 3 (three) times daily as needed for nausea. (Patient not taking: Reported on 06/17/2020) 90 tablet 0  . nutrition supplement, JUVEN, (JUVEN) PACK Take 1 packet by mouth 2 (two) times daily between meals. 180 packet 3  . pantoprazole (PROTONIX) 40 MG tablet TAKE 1 TABLET BY MOUTH EVERY DAY (Patient not taking: Reported on 06/17/2020) 60 tablet 0   No facility-administered medications prior to visit.    PAST MEDICAL HISTORY: Past  Medical History:  Diagnosis Date  . Acute kidney injury ([place])   . Aortic stenosis, mild 11/17/2013  . Arthritis    "both knees, spine, back, fingers" (11/29/2013)  . CHF (congestive heart failure) ([place])   . Edema   . GERD (gastroesophageal reflux disease)   . Heart murmur   . History of diastolic dysfunction    Echo 04/1550 with diastolic dysfunction  . HTN (hypertension)   . Morbid obesity ([place])   . OA (osteoarthritis)   . PAC (premature atrial contraction)    per prior Holter  . Palpitations   . Pneumonia    "as a child"  . PVC's (premature ventricular contractions)    per prior Holter  . SVT (supraventricular tachycardia) ([place])   . Tachycardia    noted at 07/28/11 visit. Started on beta blocker. Possible atrial flutter vs long PR tachycardia/AVNRT  . Type II diabetes mellitus ([place])     PAST SURGICAL HISTORY: Past Surgical History:  Procedure Laterality Date  . BIOPSY  03/23/2020   Procedure: BIOPSY;  Surgeon: Juanita Craver, MD;  Location: [facility] ENDOSCOPY;  Service: Endoscopy;;  . [place]  . CESAREAN SECTION  1985  . ESOPHAGOGASTRODUODENOSCOPY (EGD) WITH PROPOFOL N/A 03/23/2020   Procedure: ESOPHAGOGASTRODUODENOSCOPY (EGD) WITH PROPOFOL;  Surgeon: Juanita Craver, MD;  Location: [facility] ENDOSCOPY;  Service: Endoscopy;  Laterality: N/A;  . SUPRAVENTRICULAR TACHYCARDIA ABLATION  11/29/2013  . SUPRAVENTRICULAR TACHYCARDIA ABLATION N/A 11/29/2013   Procedure: SUPRAVENTRICULAR TACHYCARDIA ABLATION;  Surgeon: Evans Lance, MD;  Location[facility] CATH LAB;  Service: Cardiovascular;  Laterality: N/A;  . US ECHOCARDIOGRAPHY  07/25/2008   EF 55-60%  . VAGINAL DELIVERY      FAMILY HISTORY: Family History  Problem Relation Age of Onset  . Alzheimer's disease Mother   . Lung cancer Mother   . COPD Father   . Diabetes Maternal Uncle   . Stroke Neg Hx   . Colon cancer Neg Hx   . Stomach cancer Neg Hx   . Esophageal cancer Neg Hx     SOCIAL HISTORY: Social History    Socioeconomic History  . Marital status: Single    Spouse name: Not on file  . Number of children: 2  . Years of education: Not on file  . Highest education level: Not on file  Occupational History  . 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 Smoking status: Former Smoker    Packs/day: 2.00    Years: 15.00    Pack years: 30.00    Types: Cigarettes    Quit date: 11/23/1985    Years since quitting: 34.5  . Smokeless tobacco: Never Used  Vaping Use  . Vaping Use: Never used  Substance and Sexual Activity  . Alcohol use: No  . Drug use: No  . Sexual activity: Never  Other Topics Concern  . Not on file  Social History Narrative   06/17/20 lives at  [facility]    Social Determinants of Radio broadcast assistant Strain: Not on file  Food Insecurity: Not on file  Transportation Needs: Not on file  Physical Activity: Not on file  Stress: Not on file  Social Connections: Not on file  Intimate Partner Violence: Not on file     PHYSICAL EXAM  GENERAL EXAM/CONSTITUTIONAL: Vitals:  Vitals:   06/17/20 0916  BP: (!) 144/78  Pulse: 72   There is no height or weight on file to calculate BMI. Wt Readings from Last 3 Encounters:  03/19/20 250 lb (113.4 kg)  02/15/20 250 lb (113.4 kg)  02/02/20 261 lb (118.4 kg)    Patient is in no distress; well developed, nourished and groomed; neck is supple  CARDIOVASCULAR:  Examination of carotid arteries is normal; no carotid bruits  Regular rate and rhythm, no murmurs  Examination of peripheral vascular system by observation and palpation is normal  EYES:  Ophthalmoscopic exam of optic discs and posterior segments is normal; no papilledema or hemorrhages No exam data present  MUSCULOSKELETAL:  Gait, strength, tone, movements noted in Neurologic exam below  NEUROLOGIC: MENTAL STATUS:  MMSE - [place] Exam 05/11/2017  Orientation to time 5  Orientation to Place 5   Registration 3  Attention/ Calculation 4  Recall 2  Language- name 2 objects 2  Language- repeat 1  Language- follow 3 step command 3  Language- read & follow direction 1  Write a sentence 1  Copy design 1  Total score 28    awake, alert, oriented to person, place and time  recent and remote memory intact  normal attention and concentration  language fluent, comprehension intact, naming intact  fund of knowledge appropriate  CRANIAL NERVE:   2nd - no papilledema on fundoscopic exam  2nd, 3rd, 4th, 6th - pupils equal and reactive to light, visual fields full to confrontation, extraocular muscles intact, no nystagmus  5th - facial sensation symmetric  7th - facial strength symmetric  8th - hearing intact  9th - palate elevates symmetrically, uvula midline  11th - shoulder shrug symmetric  12th - tongue protrusion midline  MOTOR:   normal bulk and tone, full strength in the RUE; LUE LIMITED BY LEFT ROTATOR CUFF PAIN  BLE HF 3; LEFT DF 3; RIGHT DF 4; OTHERWISE 4 IN BLE  SENSORY:   normal and symmetric to light touch;   COORDINATION:   finger-nose-finger, fine finger movements normal  REFLEXES:   deep tendon reflexes TRACE and symmetric  GAIT/STATION:   IN WHEEL CHAIR; LIMITED BY LOW BACK PAIN AND WEAKNESS     DIAGNOSTIC DATA (LABS, IMAGING, TESTING) - I reviewed patient records, labs, notes, testing and imaging myself where available.  Lab Results  Component Value Date   WBC 13.9 (H) 04/08/2020   HGB 8.4 (L) 04/08/2020   HCT 26.5 (L) 04/08/2020   MCV 82.3 04/08/2020   PLT 879 (H) 04/08/2020      Component Value Date/Time   NA 141 04/08/2020 0844   K 3.3 (L) 04/08/2020 0844   CL 104 04/08/2020 0844   CO2 25 04/08/2020 0844   GLUCOSE 118 (H) 04/08/2020 0844   BUN 27 (H) 04/08/2020 0844   CREATININE 1.00 04/08/2020 0844   CALCIUM 8.7 (L) 04/08/2020 0844   PROT 5.7 (L) 04/08/2020 0844   ALBUMIN 2.1 (L) 04/08/2020 0844   AST 63 (H)  04/08/2020 0844   ALT 72 (H) 04/08/2020 0844   ALKPHOS 95 04/08/2020 0844  BILITOT 0.6 04/08/2020 0844   GFRNONAA 59 (L) 04/08/2020 0844   GFRAA 41 (L) 01/09/2019 0456   Lab Results  Component Value Date   CHOL 178 03/20/2019   HDL 74.50 03/20/2019   LDLCALC 92 03/20/2019   TRIG 58.0 03/20/2019   CHOLHDL 2 03/20/2019   Lab Results  Component Value Date   HGBA1C 5.8 (H) 04/06/2020   Lab Results  Component Value Date   VITAMINB12 760 04/06/2020   Lab Results  Component Value Date   TSH 1.622 03/17/2020   03/19/20   Ref Range & Units 3 mo ago  Vitamin B1 (Thiamine) 66.5 - 200.0 nmol/L 28.0Low     12/18/06 CT lumbar spine - Severe multilevel degenerative disc and facet disease changes of lumbar spine with areas of neural foraminal stenosis and disc herniation as above.   03/18/20 MRI brain [I reviewed images myself and agree with interpretation. -VRP]  - Diffusion-weighted and T2/FLAIR hyperintense signal abnormality within the medial thalami, periaqueductal gray matter and dorsal medulla. These findings are highly suggestive of Wernicke encephalopathy. - Mild generalized parenchymal atrophy and chronic small vessel ischemic disease. - Cervical spondylosis. At C3-C4, grade 1 retrolisthesis contributes to suspected at least moderate spinal canal stenosis and contacts the ventral spinal cord. At least moderate spinal canal stenosis is also suspected at C4-C5. - Moderate-sized right maxillary sinus mucous retention cyst.  03/20/20 MRI cervical spine [I reviewed images myself and agree with interpretation. -VRP]  - Limited study. The patient was not able to hold still or complete the study. - Multilevel spondylosis causing significant spinal and foraminal stenosis at multiple levels. Recommend CT cervical spine for further Evaluation.  03/20/20 CT cervical spine [I reviewed images myself and agree with interpretation. -VRP]  1. Moderate spinal canal stenosis and severe  bilateral foraminal stenosis at C3-4 and C4-5. 2. No acute fracture or static subluxation of the cervical spine.     ASSESSMENT AND PLAN  75 y.o. year old female here with:  Dx:  1. Wernicke's aphasia   2. Physical deconditioning   3. Chronic bilateral low back pain with bilateral sciatica   4. Degenerative cervical spinal stenosis        PLAN:  WERNICKE'S ENCEPHALOPATHY -Could have been related to underlying intractable nausea vomiting and poor p.o. intake; now doing better with thiamine supplementation - continue thiamine supplementation; follow up with PCP and GI  CERVICAL SPINAL STENOSIS -Found during hospitalization; no definite upper motor neuron signs, but she does have neck pain and gait difficulty - follow up with neurosurgery (original referral made by Dr. Florene Glen 04/08/20)  [place] - chronic since 2008; follow up spine surgery evaluation  Orders Placed This Encounter  Procedures  . Ambulatory referral to Neurosurgery   Return for return to PCP.    Penni Bombard, MD 8/67/6195, 09:32 AM Certified in Neurology, Neurophysiology and Neuroimaging  [facility] [address], [place], [place] [address] [phone]

## 2020-06-19 DIAGNOSIS — E119 Type 2 diabetes mellitus without complications: Secondary | ICD-10-CM | POA: Diagnosis not present

## 2020-06-19 DIAGNOSIS — E440 Moderate protein-calorie malnutrition: Secondary | ICD-10-CM | POA: Diagnosis not present

## 2020-06-19 DIAGNOSIS — L89153 Pressure ulcer of sacral region, stage 3: Secondary | ICD-10-CM | POA: Diagnosis not present

## 2020-06-27 ENCOUNTER — Other Ambulatory Visit (HOSPITAL_COMMUNITY): Payer: Self-pay | Admitting: Neurosurgery

## 2020-06-27 ENCOUNTER — Other Ambulatory Visit: Payer: Self-pay | Admitting: Neurosurgery

## 2020-06-27 DIAGNOSIS — E512 Wernicke's encephalopathy: Secondary | ICD-10-CM | POA: Diagnosis not present

## 2020-06-27 DIAGNOSIS — M4802 Spinal stenosis, cervical region: Secondary | ICD-10-CM

## 2020-06-27 DIAGNOSIS — G992 Myelopathy in diseases classified elsewhere: Secondary | ICD-10-CM

## 2020-06-28 DIAGNOSIS — L89153 Pressure ulcer of sacral region, stage 3: Secondary | ICD-10-CM | POA: Diagnosis not present

## 2020-06-28 DIAGNOSIS — E440 Moderate protein-calorie malnutrition: Secondary | ICD-10-CM | POA: Diagnosis not present

## 2020-06-28 DIAGNOSIS — E119 Type 2 diabetes mellitus without complications: Secondary | ICD-10-CM | POA: Diagnosis not present

## 2020-07-08 ENCOUNTER — Ambulatory Visit (HOSPITAL_COMMUNITY): Admission: RE | Admit: 2020-07-08 | Payer: PPO | Source: Ambulatory Visit

## 2020-07-08 ENCOUNTER — Encounter (HOSPITAL_COMMUNITY): Payer: Self-pay

## 2020-07-08 DIAGNOSIS — G8929 Other chronic pain: Secondary | ICD-10-CM | POA: Diagnosis not present

## 2020-07-08 DIAGNOSIS — M25561 Pain in right knee: Secondary | ICD-10-CM | POA: Diagnosis not present

## 2020-07-08 DIAGNOSIS — N1831 Chronic kidney disease, stage 3a: Secondary | ICD-10-CM | POA: Diagnosis not present

## 2020-07-08 DIAGNOSIS — I82409 Acute embolism and thrombosis of unspecified deep veins of unspecified lower extremity: Secondary | ICD-10-CM | POA: Diagnosis not present

## 2020-07-08 DIAGNOSIS — M25562 Pain in left knee: Secondary | ICD-10-CM | POA: Diagnosis not present

## 2020-07-08 DIAGNOSIS — E119 Type 2 diabetes mellitus without complications: Secondary | ICD-10-CM | POA: Diagnosis not present

## 2020-07-08 DIAGNOSIS — R5381 Other malaise: Secondary | ICD-10-CM | POA: Diagnosis not present

## 2020-07-08 DIAGNOSIS — M1990 Unspecified osteoarthritis, unspecified site: Secondary | ICD-10-CM | POA: Diagnosis not present

## 2020-07-17 DIAGNOSIS — E440 Moderate protein-calorie malnutrition: Secondary | ICD-10-CM | POA: Diagnosis not present

## 2020-07-17 DIAGNOSIS — L89153 Pressure ulcer of sacral region, stage 3: Secondary | ICD-10-CM | POA: Diagnosis not present

## 2020-07-17 DIAGNOSIS — E119 Type 2 diabetes mellitus without complications: Secondary | ICD-10-CM | POA: Diagnosis not present

## 2020-07-24 DIAGNOSIS — G992 Myelopathy in diseases classified elsewhere: Secondary | ICD-10-CM | POA: Diagnosis not present

## 2020-07-24 DIAGNOSIS — L89153 Pressure ulcer of sacral region, stage 3: Secondary | ICD-10-CM | POA: Diagnosis not present

## 2020-07-24 DIAGNOSIS — E512 Wernicke's encephalopathy: Secondary | ICD-10-CM | POA: Diagnosis not present

## 2020-07-24 DIAGNOSIS — E440 Moderate protein-calorie malnutrition: Secondary | ICD-10-CM | POA: Diagnosis not present

## 2020-07-24 DIAGNOSIS — E119 Type 2 diabetes mellitus without complications: Secondary | ICD-10-CM | POA: Diagnosis not present

## 2020-07-24 DIAGNOSIS — I10 Essential (primary) hypertension: Secondary | ICD-10-CM | POA: Diagnosis not present

## 2020-07-27 ENCOUNTER — Ambulatory Visit (HOSPITAL_COMMUNITY): Payer: PPO

## 2020-07-30 ENCOUNTER — Ambulatory Visit (HOSPITAL_COMMUNITY): Payer: PPO

## 2020-07-31 ENCOUNTER — Ambulatory Visit (HOSPITAL_COMMUNITY)
Admission: RE | Admit: 2020-07-31 | Discharge: 2020-07-31 | Disposition: A | Discharge status: Home / Self Care | Payer: PPO | Source: Ambulatory Visit | Attending: Neurosurgery | Admitting: Neurosurgery

## 2020-07-31 ENCOUNTER — Other Ambulatory Visit: Payer: Self-pay

## 2020-07-31 DIAGNOSIS — M4802 Spinal stenosis, cervical region: Secondary | ICD-10-CM | POA: Diagnosis not present

## 2020-07-31 DIAGNOSIS — E440 Moderate protein-calorie malnutrition: Secondary | ICD-10-CM | POA: Diagnosis not present

## 2020-07-31 DIAGNOSIS — Z8669 Personal history of other diseases of the nervous system and sense organs: Secondary | ICD-10-CM | POA: Diagnosis not present

## 2020-07-31 DIAGNOSIS — L89153 Pressure ulcer of sacral region, stage 3: Secondary | ICD-10-CM | POA: Diagnosis not present

## 2020-07-31 DIAGNOSIS — G992 Myelopathy in diseases classified elsewhere: Secondary | ICD-10-CM | POA: Diagnosis not present

## 2020-07-31 DIAGNOSIS — E119 Type 2 diabetes mellitus without complications: Secondary | ICD-10-CM | POA: Diagnosis not present

## 2020-07-31 DIAGNOSIS — M50223 Other cervical disc displacement at C6-C7 level: Secondary | ICD-10-CM | POA: Diagnosis not present

## 2020-07-31 DIAGNOSIS — M5021 Other cervical disc displacement,  high cervical region: Secondary | ICD-10-CM | POA: Diagnosis not present

## 2020-07-31 IMAGING — MR MR CERVICAL SPINE W/O CM
5 series · 42 of 48 positions shown · non-contrast
Comparison: MRI of the cervical spine [DATE].

CLINICAL DATA: Stenosis of cervical spine with myelopathy.

EXAM:
MRI CERVICAL SPINE WITHOUT CONTRAST
TECHNIQUE: Multiplanar, multisequence MR imaging of the cervical spine was
performed. No intravenous contrast was administered.

[Series 5: T1 · sagittal · 3.0mm · 0.86mm/px · 8 of 15 slices shown]
[im 1/15]
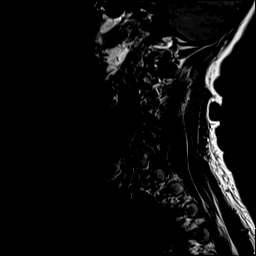
[im 3/15]
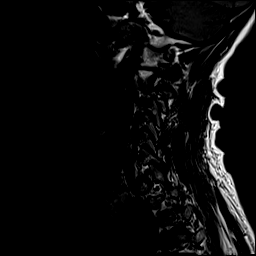
[im 5/15]
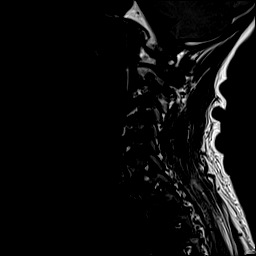
[im 7/15]
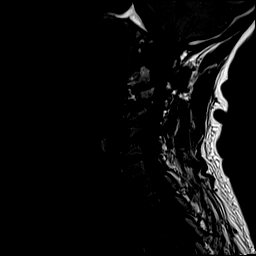
[im 9/15]
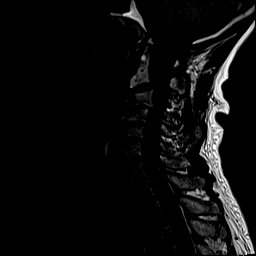
[im 11/15]
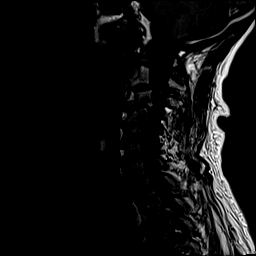
[im 13/15]
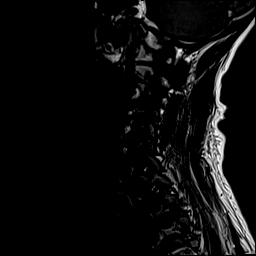
[im 15/15]
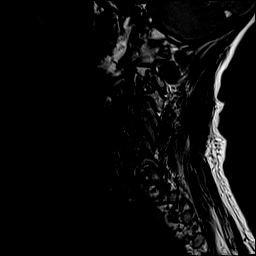

[Series 6: T2 · sagittal · 3.0mm · 0.86mm/px · 7 of 15 slices shown (1 of 2)]
[im 1/15]
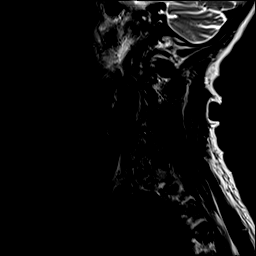
[im 3/15]
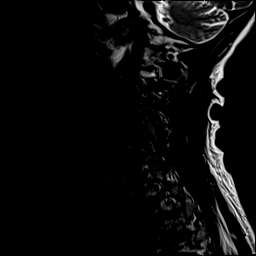
[im 5/15]
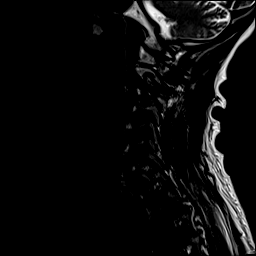
[im 8/15]
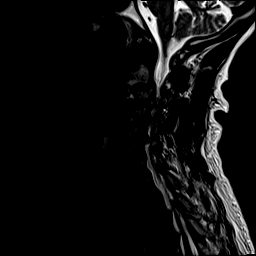
[im 10/15]
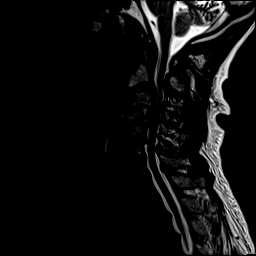
[im 12/15]
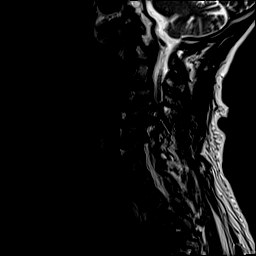
[im 15/15]
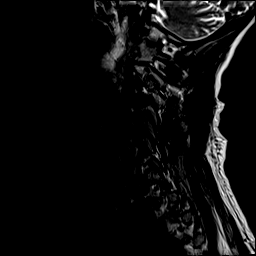

[Series 7: STIR · sagittal · 3.0mm · 0.86mm/px · 7 of 15 slices shown]
[im 1/15]
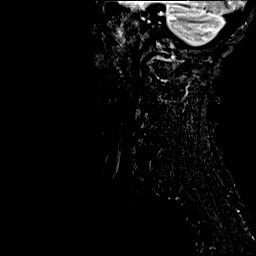
[im 3/15]
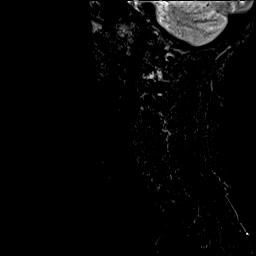
[im 5/15]
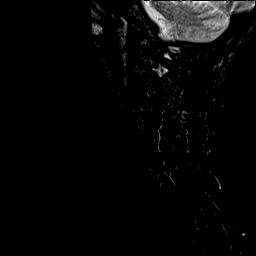
[im 8/15]
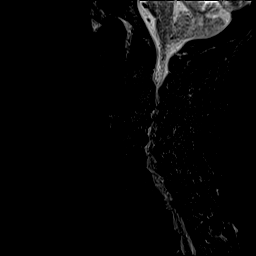
[im 10/15]
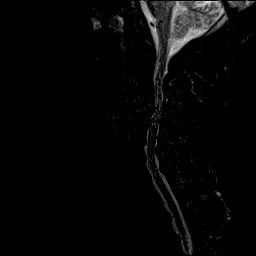
[im 12/15]
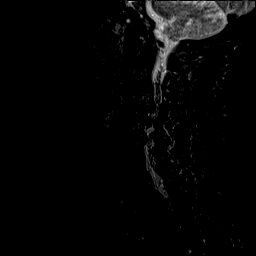
[im 15/15]
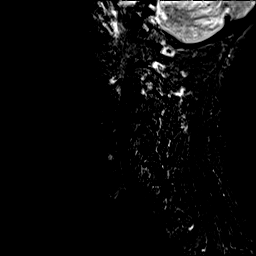

[Series 8: T2 · axial · 3.0mm · 0.70mm/px · z∈[+15,+115]mm · 12 of 25 slices shown (2 of 2)]
[im 1/25]
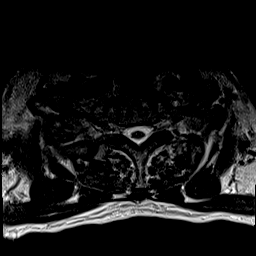
[im 3/25]
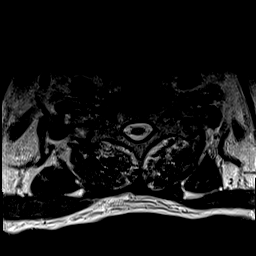
[im 5/25]
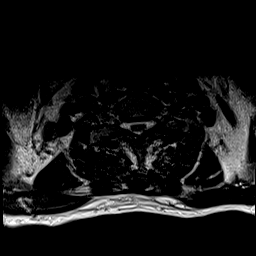
[im 7/25]
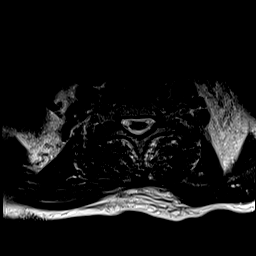
[im 9/25]
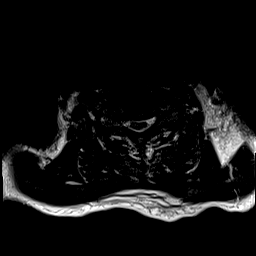
[im 11/25]
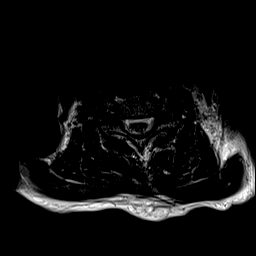
[im 14/25]
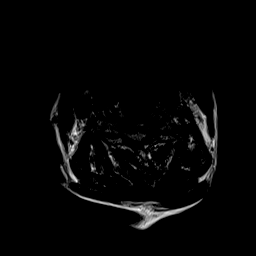
[im 16/25]
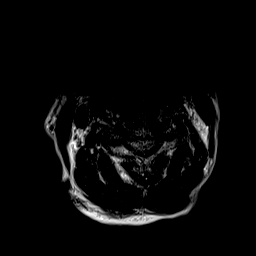
[im 18/25]
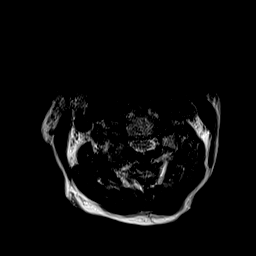
[im 20/25]
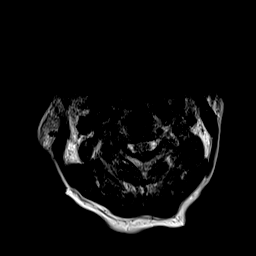
[im 22/25]
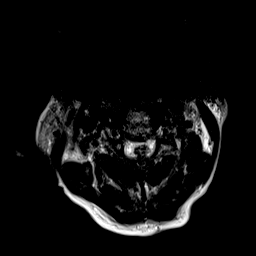
[im 25/25]
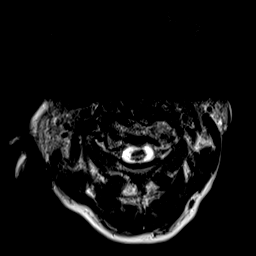

[Series 9: GRE · axial · 3.0mm · 0.35mm/px · z∈[+15,+115]mm · 8 of 30 slices shown]
[im 1/30]
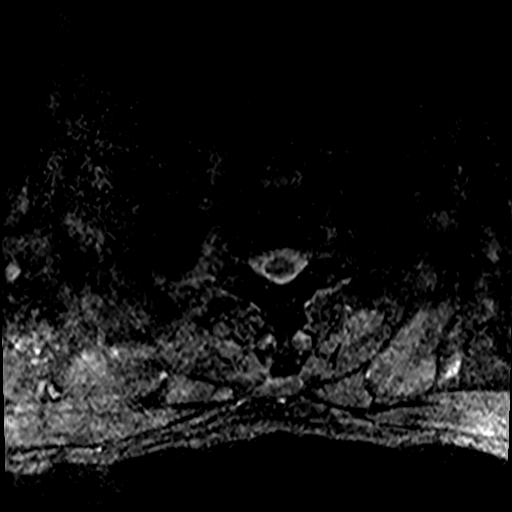
[im 5/30]
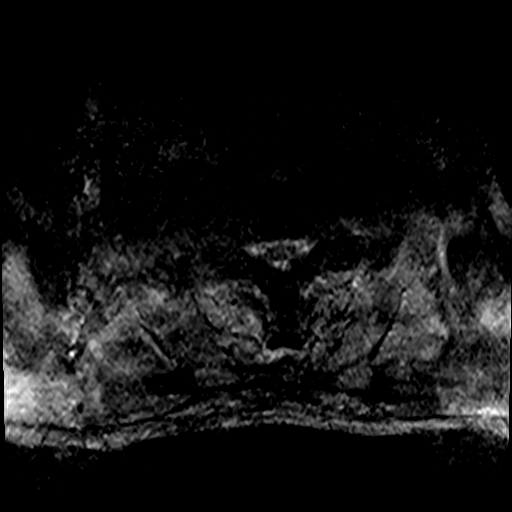
[im 9/30]
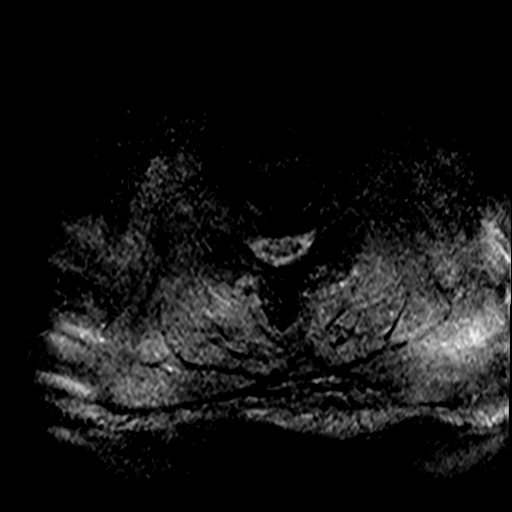
[im 14/30]
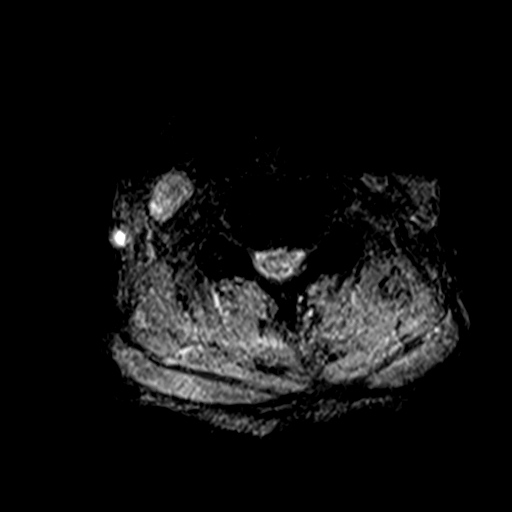
[im 16/30]
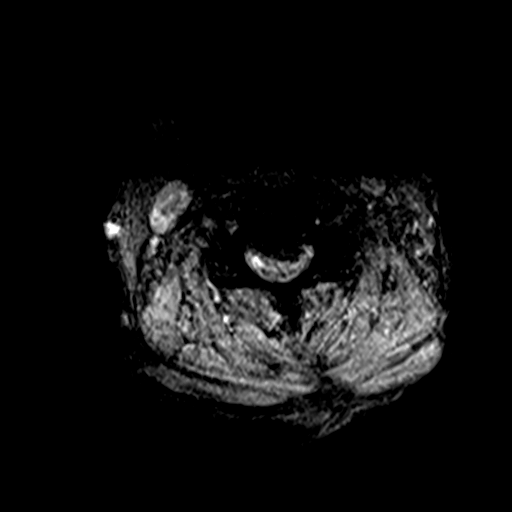
[im 21/30]
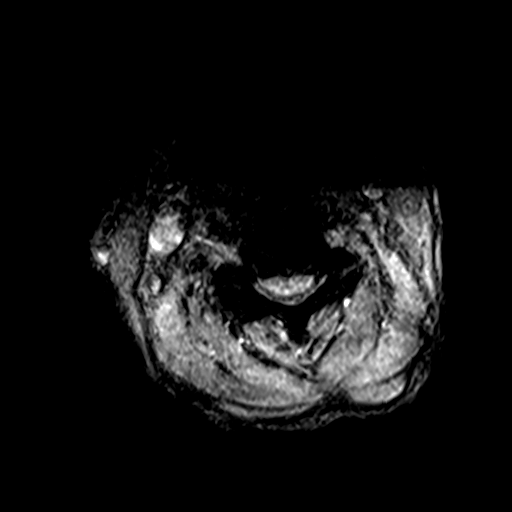
[im 25/30]
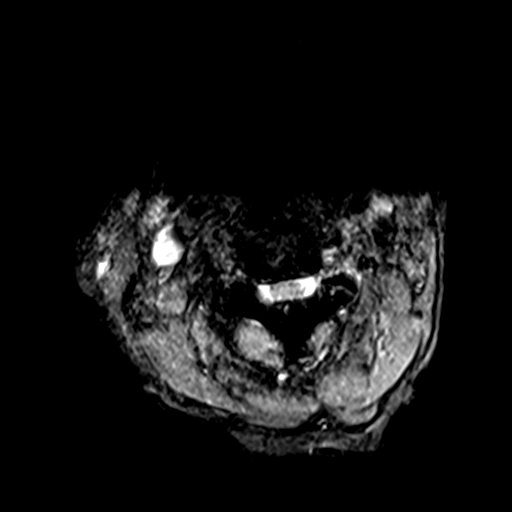
[im 30/30]
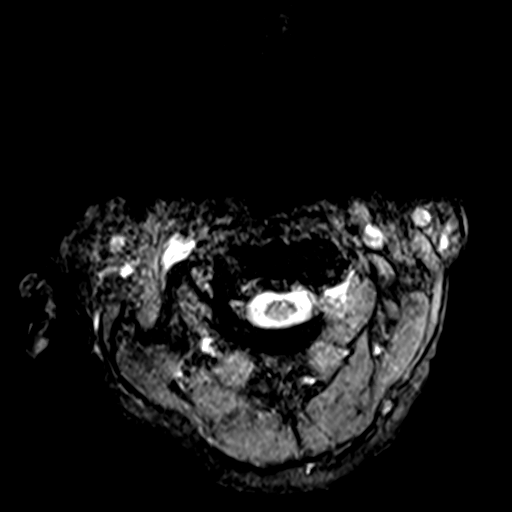

[42 of 48 positions shown; findings below may reference images not displayed]

FINDINGS: The study is partially degraded by motion.

Alignment: Reversal of the cervical curvature with small
anterolisthesis of C3 over C4 and retrolisthesis from C3-4 through
C5-6.

Vertebrae: No fracture, evidence of discitis, or bone lesion.
Endplate degenerative change from C3-4 through C5-6.

Cord: Limited evaluation due to motion artifacts. Mass effect on the
cord noted at C3-4 and C4-5.

Posterior Fossa, vertebral arteries, paraspinal tissues: Negative.

Disc levels:

C2-3: Small posterior disc protrusion resulting mild spinal canal
stenosis. Uncovertebral and facet degenerative changes resulting
mild right neural foraminal narrowing.

C3-4: Posterior disc osteophyte complex resulting in severe spinal
canal stenosis. Uncovertebral and facet degenerative changes
resulting in severe bilateral neural foraminal narrowing.

C4-5: Posterior disc osteophyte complex resulting in severe spinal
canal stenosis. Uncovertebral and facet degenerative changes
resulting severe bilateral neural foraminal narrowing.

C5-6: Posterior disc osteophyte complex resulting in moderate spinal
canal stenosis. Uncovertebral and facet degenerative changes
resulting in severe bilateral neural foraminal narrowing.

C6-7: Posterior disc protrusion resulting in mild spinal canal
stenosis. Uncovertebral and facet degenerative changes resulting in
mild right neural foraminal narrowing.

C7-T1: Small posterior disc protrusion without significant spinal
canal stenosis. Facet degenerative changes without significant
neural foraminal narrowing.
IMPRESSION: 1. Motion degraded study.
2. Multilevel spondylosis as described, with severe spinal canal
stenosis at C3-4 and C4-5 and moderate spinal canal stenosis at
C5-6.
3. Severe bilateral neural foraminal narrowing at C3-4, C4-5 and
C5-6.

## 2020-08-07 DIAGNOSIS — I10 Essential (primary) hypertension: Secondary | ICD-10-CM | POA: Diagnosis not present

## 2020-08-07 DIAGNOSIS — M4802 Spinal stenosis, cervical region: Secondary | ICD-10-CM | POA: Diagnosis not present

## 2020-08-14 DIAGNOSIS — E440 Moderate protein-calorie malnutrition: Secondary | ICD-10-CM | POA: Diagnosis not present

## 2020-08-14 DIAGNOSIS — L89153 Pressure ulcer of sacral region, stage 3: Secondary | ICD-10-CM | POA: Diagnosis not present

## 2020-08-14 DIAGNOSIS — E119 Type 2 diabetes mellitus without complications: Secondary | ICD-10-CM | POA: Diagnosis not present

## 2020-08-16 DIAGNOSIS — Z79899 Other long term (current) drug therapy: Secondary | ICD-10-CM | POA: Diagnosis not present

## 2020-08-16 DIAGNOSIS — I10 Essential (primary) hypertension: Secondary | ICD-10-CM | POA: Diagnosis not present

## 2020-08-16 DIAGNOSIS — M1990 Unspecified osteoarthritis, unspecified site: Secondary | ICD-10-CM | POA: Diagnosis not present

## 2020-08-16 DIAGNOSIS — N1831 Chronic kidney disease, stage 3a: Secondary | ICD-10-CM | POA: Diagnosis not present

## 2020-08-16 DIAGNOSIS — E119 Type 2 diabetes mellitus without complications: Secondary | ICD-10-CM | POA: Diagnosis not present

## 2020-08-16 DIAGNOSIS — M545 Low back pain, unspecified: Secondary | ICD-10-CM | POA: Diagnosis not present

## 2020-08-21 ENCOUNTER — Telehealth: Payer: Self-pay | Admitting: Internal Medicine

## 2020-08-21 DIAGNOSIS — L89153 Pressure ulcer of sacral region, stage 3: Secondary | ICD-10-CM | POA: Diagnosis not present

## 2020-08-21 DIAGNOSIS — E440 Moderate protein-calorie malnutrition: Secondary | ICD-10-CM | POA: Diagnosis not present

## 2020-08-21 DIAGNOSIS — E119 Type 2 diabetes mellitus without complications: Secondary | ICD-10-CM | POA: Diagnosis not present

## 2020-08-21 NOTE — Telephone Encounter (Signed)
Patient daughter calling, states there is some kind of pump the patient needs to keep the fluid off her legs. They are trying to get one to [facility] for her

## 2020-08-22 NOTE — Telephone Encounter (Signed)
Unable to get in contact with the patients daughter. VM full. I will attempt to contact later.

## 2020-08-23 DIAGNOSIS — N1831 Chronic kidney disease, stage 3a: Secondary | ICD-10-CM | POA: Diagnosis not present

## 2020-08-23 DIAGNOSIS — I10 Essential (primary) hypertension: Secondary | ICD-10-CM | POA: Diagnosis not present

## 2020-08-23 DIAGNOSIS — E119 Type 2 diabetes mellitus without complications: Secondary | ICD-10-CM | POA: Diagnosis not present

## 2020-08-23 DIAGNOSIS — R42 Dizziness and giddiness: Secondary | ICD-10-CM | POA: Diagnosis not present

## 2020-08-23 NOTE — Telephone Encounter (Signed)
Unable to get in contact with the patient's daughter. VM is still full.

## 2020-08-26 DIAGNOSIS — R5381 Other malaise: Secondary | ICD-10-CM | POA: Diagnosis not present

## 2020-08-26 DIAGNOSIS — R42 Dizziness and giddiness: Secondary | ICD-10-CM | POA: Diagnosis not present

## 2020-08-26 DIAGNOSIS — E119 Type 2 diabetes mellitus without complications: Secondary | ICD-10-CM | POA: Diagnosis not present

## 2020-08-26 DIAGNOSIS — Z79899 Other long term (current) drug therapy: Secondary | ICD-10-CM | POA: Diagnosis not present

## 2020-08-26 DIAGNOSIS — N1831 Chronic kidney disease, stage 3a: Secondary | ICD-10-CM | POA: Diagnosis not present

## 2020-08-26 DIAGNOSIS — M25561 Pain in right knee: Secondary | ICD-10-CM | POA: Diagnosis not present

## 2020-08-26 DIAGNOSIS — M25562 Pain in left knee: Secondary | ICD-10-CM | POA: Diagnosis not present

## 2020-08-29 DIAGNOSIS — E118 Type 2 diabetes mellitus with unspecified complications: Secondary | ICD-10-CM | POA: Diagnosis not present

## 2020-08-30 DIAGNOSIS — I10 Essential (primary) hypertension: Secondary | ICD-10-CM | POA: Diagnosis not present

## 2020-08-30 DIAGNOSIS — N1831 Chronic kidney disease, stage 3a: Secondary | ICD-10-CM | POA: Diagnosis not present

## 2020-08-30 DIAGNOSIS — E119 Type 2 diabetes mellitus without complications: Secondary | ICD-10-CM | POA: Diagnosis not present

## 2020-08-30 DIAGNOSIS — R5381 Other malaise: Secondary | ICD-10-CM | POA: Diagnosis not present

## 2020-09-03 DIAGNOSIS — M545 Low back pain, unspecified: Secondary | ICD-10-CM | POA: Diagnosis not present

## 2020-09-03 DIAGNOSIS — K219 Gastro-esophageal reflux disease without esophagitis: Secondary | ICD-10-CM | POA: Diagnosis not present

## 2020-09-03 DIAGNOSIS — I890 Lymphedema, not elsewhere classified: Secondary | ICD-10-CM | POA: Diagnosis not present

## 2020-09-04 DIAGNOSIS — E440 Moderate protein-calorie malnutrition: Secondary | ICD-10-CM | POA: Diagnosis not present

## 2020-09-04 DIAGNOSIS — E119 Type 2 diabetes mellitus without complications: Secondary | ICD-10-CM | POA: Diagnosis not present

## 2020-09-04 DIAGNOSIS — L89153 Pressure ulcer of sacral region, stage 3: Secondary | ICD-10-CM | POA: Diagnosis not present

## 2020-09-09 ENCOUNTER — Ambulatory Visit (INDEPENDENT_AMBULATORY_CARE_PROVIDER_SITE_OTHER): Payer: PPO

## 2020-09-09 DIAGNOSIS — Z0000 Encounter for general adult medical examination without abnormal findings: Secondary | ICD-10-CM

## 2020-09-09 NOTE — Progress Notes (Signed)
I connected with Britne Bisceglia today by telephone and verified that I am speaking with the correct person using two identifiers. Location patient: home Location provider: work Persons participating in the virtual visit: Angelys Pettry and [employer], [place].   I discussed the limitations, risks, security and privacy concerns of performing an evaluation and management service by telephone and the availability of in person appointments. I also discussed with the patient that there may be a patient responsible charge related to this service. The patient expressed understanding and verbally consented to this telephonic visit.    Interactive audio and video telecommunications were attempted between this provider and patient, however failed, due to patient having technical difficulties OR patient did not have access to video capability.  We continued and completed visit with audio only.  Some vital signs may be absent or patient reported.   Time Spent with patient on telephone encounter: 30 minutes  Subjective:   [name] [name] is a 75 y.o. female who presents for Medicare Annual (Subsequent) preventive examination.  Review of Systems    No ROS. Medicare Wellness Visit. Additional risk factors are reflected in social history. Cardiac Risk Factors include: advanced age (>11men, >31 women);diabetes mellitus;hypertension     Objective:    Today's Vitals   09/09/20 1342  PainSc: 8    There is no height or weight on file to calculate BMI.  Advanced Directives 09/09/2020 03/17/2020 02/23/2020 07/11/2019 01/07/2019 01/06/2019 05/13/2018  Does Patient Have a Medical Advance Directive? No No No No No No No  Would patient like information on creating a medical advance directive? No - Patient declined No - Patient declined - - No - Patient declined - Yes (ED - Information included in AVS)    Current Medications (verified) Outpatient Encounter Medications as of 09/09/2020  Medication Sig  .  Albuterol Sulfate (PROAIR RESPICLICK) 673 (90 Base) MCG/ACT AEPB Inhale into the lungs. 2 puffs every 6 hours as needed  . apixaban (ELIQUIS) 2.5 MG TABS tablet Take by mouth 2 (two) times daily.  . collagenase (SANTYL) ointment Apply topically daily.  Marland Kitchen dextromethorphan-guaiFENesin (MUCINEX DM) 30-600 MG 12hr tablet Take 1 tablet by mouth 2 (two) times daily.  . diclofenac Sodium (VOLTAREN) 1 % GEL Apply topically 4 (four) times daily.  . feeding supplement, ENSURE COMPLETE, (ENSURE COMPLETE) LIQD Take 237 mLs by mouth 2 (two) times daily between meals.  . folic acid (FOLVITE) 1 MG tablet Take 1 mg by mouth daily.  . metFORMIN (GLUCOPHAGE) 500 MG tablet Take 1 tablet (500 mg total) by mouth daily with breakfast.  . metoCLOPramide (REGLAN) 5 MG tablet Take 1 tablet (5 mg total) by mouth 3 (three) times daily as needed for nausea. (Patient not taking: Reported on 06/17/2020)  . METOPROLOL TARTRATE PO Take 25 mg by mouth daily.  . montelukast (SINGULAIR) 10 MG tablet Take 1 tablet (10 mg total) by mouth at bedtime.  . Multiple Vitamin (MULTIVITAMIN WITH MINERALS) TABS tablet Take 1 tablet by mouth daily.  . nutrition supplement, JUVEN, (JUVEN) PACK Take 1 packet by mouth 2 (two) times daily between meals.  . ondansetron (ZOFRAN ODT) 4 MG disintegrating tablet Take 1 tablet (4 mg total) by mouth every 8 (eight) hours as needed for nausea or vomiting.  . pantoprazole (PROTONIX) 40 MG tablet TAKE 1 TABLET BY MOUTH EVERY DAY (Patient not taking: Reported on 06/17/2020)  . promethazine (PHENERGAN) 25 MG suppository Place 1 suppository (25 mg total) rectally every 6 (six) hours as needed for nausea  or vomiting.  . sucralfate (CARAFATE) 1 GM/10ML suspension Take 10 mLs (1 g total) by mouth 4 (four) times daily -  with meals and at bedtime.  . thiamine 100 MG tablet Take 1 tablet (100 mg total) by mouth daily.   No facility-administered encounter medications on file as of 09/09/2020.    Allergies  (verified) Oxycodone hcl and Penicillins   History: Past Medical History:  Diagnosis Date  . Acute kidney injury ([place])   . Aortic stenosis, mild 11/17/2013  . Arthritis    "both knees, spine, back, fingers" (11/29/2013)  . CHF (congestive heart failure) ([place])   . Edema   . GERD (gastroesophageal reflux disease)   . Heart murmur   . History of diastolic dysfunction    Echo 10/3708 with diastolic dysfunction  . HTN (hypertension)   . Morbid obesity ([place])   . OA (osteoarthritis)   . PAC (premature atrial contraction)    per prior Holter  . Palpitations   . Pneumonia    "as a child"  . PVC's (premature ventricular contractions)    per prior Holter  . SVT (supraventricular tachycardia) ([place])   . Tachycardia    noted at 07/28/11 visit. Started on beta blocker. Possible atrial flutter vs long PR tachycardia/AVNRT  . Type II diabetes mellitus ([place])    Past Surgical History:  Procedure Laterality Date  . BIOPSY  03/23/2020   Procedure: BIOPSY;  Surgeon: Juanita Craver, MD;  Location: [facility] ENDOSCOPY;  Service: Endoscopy;;  . [place]  . CESAREAN SECTION  1985  . ESOPHAGOGASTRODUODENOSCOPY (EGD) WITH PROPOFOL N/A 03/23/2020   Procedure: ESOPHAGOGASTRODUODENOSCOPY (EGD) WITH PROPOFOL;  Surgeon: Juanita Craver, MD;  Location[facility] ENDOSCOPY;  Service: Endoscopy;  Laterality: N/A;  . SUPRAVENTRICULAR TACHYCARDIA ABLATION  11/29/2013  . SUPRAVENTRICULAR TACHYCARDIA ABLATION N/A 11/29/2013   Procedure: SUPRAVENTRICULAR TACHYCARDIA ABLATION;  Surgeon: Evans Lance, MD;  Location: [facility] CATH LAB;  Service: Cardiovascular;  Laterality: N/A;  . US ECHOCARDIOGRAPHY  07/25/2008   EF 55-60%  . VAGINAL DELIVERY     Family History  Problem Relation Age of Onset  . Alzheimer's disease Mother   . Lung cancer Mother   . COPD Father   . Diabetes Maternal Uncle   . Stroke Neg Hx   . Colon cancer Neg Hx   . Stomach cancer Neg Hx   . Esophageal cancer Neg Hx    Social History   Socioeconomic  History  . Marital status: Single    Spouse name: Not on file  . Number of children: 2  . Years of education: Not on file  . Highest education level: Not on file  Occupational History  . 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 Smoking status: Former Smoker    Packs/day: 2.00    Years: 15.00    Pack years: 30.00    Types: Cigarettes    Quit date: 11/23/1985    Years since quitting: 34.8  . Smokeless tobacco: Never Used  Vaping Use  . Vaping Use: Never used  Substance and Sexual Activity  . Alcohol use: No  . Drug use: No  . Sexual activity: Never  Other Topics Concern  . Not on file  Social History Narrative   06/17/20 lives at [place] Determinants of Health   Financial Resource Strain: Low Risk   . Difficulty of Paying Living Expenses: Not hard at all  Food Insecurity: No Food  Insecurity  . Worried About Charity fundraiser in the Last Year: Never true  . Ran Out of Food in the Last Year: Never true  Transportation Needs: Unmet Transportation Needs  . Lack of Transportation (Medical): Yes  . Lack of Transportation (Non-Medical): Yes  Physical Activity: Inactive  . Days of Exercise per Week: 0 days  . Minutes of Exercise per Session: 0 min  Stress: No Stress Concern Present  . Feeling of Stress : Not at all  Social Connections: Socially Isolated  . Frequency of Communication with Friends and Family: More than three times a week  . Frequency of Social Gatherings with Friends and Family: Once a week  . Attends Religious Services: Never  . Active Member of Clubs or Organizations: No  . Attends Archivist Meetings: Never  . Marital Status: Never married    Tobacco Counseling Counseling given: Not Answered   Clinical Intake:  Pre-visit preparation completed: Yes  Pain : 0-10 Pain Score: 8  Pain Type: Chronic pain Pain Location: Leg Pain Orientation: Right,Left Pain Descriptors / Indicators:  Shooting,Discomfort,Other (Comment) (lymphedema in bilateral legs) Pain Onset: More than a month ago Pain Frequency: Intermittent Effect of Pain on Daily Activities: Pain can diminish job performance, lower motivation to exercise, and prevent you from completing daily tasks. Pain produces disability and affects the quality of life.     Nutritional Risks: None Diabetes: Yes CBG done?: No Did pt. bring in CBG monitor from home?: No  How often do you need to have someone help you when you read instructions, pamphlets, or other written materials from your doctor or pharmacy?: 1 - Never What is the last grade level you completed in school?: 2 years of college  Diabetic? yes  Interpreter Needed?: No  Information entered by :: Lisette Abu, LPN   Activities of Daily Living In your present state of health, do you have any difficulty performing the following activities: 09/09/2020 03/17/2020  Hearing? N N  Vision? N N  Difficulty concentrating or making decisions? Y N  Walking or climbing stairs? Y Y  Dressing or bathing? Y N  Doing errands, shopping? Y N  Preparing Food and eating ? Y -  Using the Toilet? Y -  In the past six months, have you accidently leaked urine? Y -  Do you have problems with loss of bowel control? Y -  Managing your Medications? Y -  Managing your Finances? Y -  Housekeeping or managing your Housekeeping? Y -  Some recent data might be hidden    Patient Care Team: Hoyt Koch, MD as PCP - General (Internal Medicine) Marzetta Board, DPM as Consulting Physician (Podiatry) [facility] Charlton Haws, RPH (Pharmacist) Katy Fitch, Darlina Guys, MD as Consulting Physician (Ophthalmology)  Indicate any recent Medical Services you may have received from other than [facility] providers in the past year (date may be approximate).     Assessment:   This is a routine wellness examination for Jazma.  Hearing/Vision screen No  exam data present  Dietary issues and exercise activities discussed: Current Exercise Habits: The patient does not participate in regular exercise at present, Exercise limited by: Other - see comments;cardiac condition(s);orthopedic condition(s) (lymphedema)  Goals Addressed   None    Depression Screen PHQ 2/9 Scores 09/09/2020 07/11/2019 05/13/2018 05/13/2018 10/11/2017 09/10/2017 08/02/2017  PHQ - 2 Score 0 0 0 0 0 0 0  PHQ- 9 Score - - 3 - - - -  Fall Risk Fall Risk  09/09/2020 07/28/2019 07/11/2019 05/13/2018 10/11/2017  Falls in the past year? 0 0 0 1 Yes  Number falls in past yr: 0 0 0 0 1  Injury with Fall? 0 0 0 0 No  Risk Factor Category  - - - - -  Comment - - - - -  Risk for fall due to : Impaired balance/gait - Impaired balance/gait Impaired balance/gait;Impaired mobility History of fall(s);Impaired balance/gait;Impaired mobility  Risk for fall due to: Comment - - - - -  Follow up - - Falls evaluation completed;Education provided;Falls prevention discussed Falls prevention discussed -    FALL RISK PREVENTION PERTAINING TO THE HOME:  Any stairs in or around the home? No  If so, are there any without handrails? No  Home free of loose throw rugs in walkways, pet beds, electrical cords, etc? Yes  Adequate lighting in your home to reduce risk of falls? Yes   ASSISTIVE DEVICES UTILIZED TO PREVENT FALLS:  Life alert? Yes  Use of a cane, walker or w/c? Yes  Grab bars in the bathroom? Yes  Shower chair or bench in shower? Yes  Elevated toilet seat or a handicapped toilet? Yes   TIMED UP AND GO:  Was the test performed? No .  Length of time to ambulate 10 feet: 0 sec.   Gait unsteady with use of assistive device, provider informed and education provided.   Cognitive Function: MMSE - Mini Mental State Exam 05/11/2017  Orientation to time 5  Orientation to Place 5  Registration 3  Attention/ Calculation 4  Recall 2  Language- name 2 objects 2  Language- repeat 1  Language- follow 3  step command 3  Language- read & follow direction 1  Write a sentence 1  Copy design 1  Total score 28     6CIT Screen 07/11/2019  What Year? 0 points  What month? 0 points  What time? 0 points  Count back from 20 0 points  Months in reverse 0 points  Repeat phrase 0 points  Total Score 0    Immunizations Immunization History  Administered Date(s) Administered  . Fluad Quad(high Dose 65+) 01/01/2019  . Influenza, High Dose Seasonal PF 01/23/2013, 03/03/2016  . Influenza,inj,Quad PF,6+ Mos 01/05/2014, 03/05/2015  . Influenza-Unspecified 01/18/2017, 01/17/2018  . PFIZER(Purple Top)SARS-COV-2 Vaccination 06/27/2019, 06/27/2019, 07/18/2019  . Pneumococcal Conjugate-13 07/24/2014  . Pneumococcal Polysaccharide-23 10/19/2012  . Tdap 10/19/2012    TDAP status: Up to date  Flu Vaccine status: Due, Education has been provided regarding the importance of this vaccine. Advised may receive this vaccine at local pharmacy or Health Dept. Aware to provide a copy of the vaccination record if obtained from local pharmacy or Health Dept. Verbalized acceptance and understanding.  Pneumococcal vaccine status: Up to date  Covid-19 vaccine status: Completed vaccines  Qualifies for Shingles Vaccine? Yes   Zostavax completed No   Shingrix Completed?: No.    Education has been provided regarding the importance of this vaccine. Patient has been advised to call insurance company to determine out of pocket expense if they have not yet received this vaccine. Advised may also receive vaccine at local pharmacy or Health Dept. Verbalized acceptance and understanding.  Screening Tests Health Maintenance  Topic Date Due  . Pneumococcal Vaccine 72-54 Years old (1 of 4 - PCV13) Never done  . Zoster Vaccines- Shingrix (1 of 2) Never done  . MAMMOGRAM  07/01/2019  . OPHTHALMOLOGY EXAM  07/19/2019  . COVID-19 Vaccine (3 - Booster  for [place] series) 12/18/2019  . FOOT EXAM  03/20/2020  . HEMOGLOBIN A1C   10/04/2020  . INFLUENZA VACCINE  11/04/2020  . TETANUS/TDAP  10/20/2022  . COLONOSCOPY (Pts 45-69yrs Insurance coverage will need to be confirmed)  07/30/2024  . DEXA SCAN  Completed  . Hepatitis C Screening  Completed  . PNA vac Low Risk Adult  Completed  . HPV VACCINES  Aged Out    Health Maintenance  Health Maintenance Due  Topic Date Due  . Pneumococcal Vaccine 16-68 Years old (1 of 4 - PCV13) Never done  . Zoster Vaccines- Shingrix (1 of 2) Never done  . MAMMOGRAM  07/01/2019  . OPHTHALMOLOGY EXAM  07/19/2019  . COVID-19 Vaccine (3 - Booster for Pfizer series) 12/18/2019  . FOOT EXAM  03/20/2020    Colorectal cancer screening: Type of screening: Colonoscopy. Completed 07/31/2014. Repeat every 10 years  Mammogram status: Completed 06/30/2017. Repeat every year  Bone Density status: Completed 06/30/2017. Results reflect: Bone density results: NORMAL. Repeat every 2 years.  Lung Cancer Screening: (Low Dose CT Chest recommended if Age 80-80 years, 30 pack-year currently smoking OR have quit w/in 15years.) does not qualify.   Lung Cancer Screening Referral: no  Additional Screening:  Hepatitis C Screening: does qualify; Completed yes  Vision Screening: Recommended annual ophthalmology exams for early detection of glaucoma and other disorders of the eye. Is the patient up to date with their annual eye exam?  Yes  Who is the provider or what is the name of the office in which the patient attends annual eye exams? [name] Wyatt Portela, MD. If pt is not established with a provider, would they like to be referred to a provider to establish care? No .   Dental Screening: Recommended annual dental exams for proper oral hygiene  Community Resource Referral / Chronic Care Management: CRR required this visit?  No   CCM required this visit?  No      Plan:     I have personally reviewed and noted the following in the patient's chart:   . Medical and social history . Use of  alcohol, tobacco or illicit drugs  . Current medications and supplements including opioid prescriptions.  . Functional ability and status . Nutritional status . Physical activity . Advanced directives . List of other physicians . Hospitalizations, surgeries, and ER visits in previous 12 months . Vitals . Screenings to include cognitive, depression, and falls . Referrals and appointments  In addition, I have reviewed and discussed with patient certain preventive protocols, quality metrics, and best practice recommendations. A written personalized care plan for preventive services as well as general preventive health recommendations were provided to patient.     Sheral Flow, LPN   10/05/945   Nurse Notes:  Medications reviewed with patient; no opioid use noted. Patient is cogitatively intact. There were no vitals filed for this visit. There is no height or weight on file to calculate BMI.

## 2020-09-09 NOTE — Patient Instructions (Signed)
[name] , Thank you for taking time to come for your Medicare Wellness Visit. I appreciate your ongoing commitment to your health goals. Please review the following plan we discussed and let me know if I can assist you in the future.   Screening recommendations/referrals: Colonoscopy: 07/31/2014; due every 10 years Mammogram: 06/30/2017; due every 2 years Bone Density: 06/30/2017; due every 2 years Recommended yearly ophthalmology/optometry visit for glaucoma screening and checkup Recommended yearly dental visit for hygiene and checkup  Vaccinations: Influenza vaccine: Due 11/04/2020 Pneumococcal vaccine: 10/19/2012, 07/24/2014 Tdap vaccine: 10/19/2012; due every 10 years Shingles vaccine: never done   Covid-19: 06/27/2019, 07/18/2019  Advanced directives: Advance directive discussed with you today. Even though you declined this today please call our office should you change your mind and we can give you the proper paperwork for you to fill out.  Conditions/risks identified: Yes. Reviewed health maintenance screenings with patient today and relevant education, vaccines, and/or referrals were provided. Continue doing brain stimulating activities (puzzles, reading, adult coloring books, staying active) to keep memory sharp. Continue to eat heart healthy diet (full of fruits, vegetables, whole grains, lean protein, water--limit salt, fat, and sugar intake) and increase physical activity as tolerated.  Next appointment: Please schedule your next Medicare Wellness Visit with your Nurse Health Advisor in 1 year by calling [phone].  Preventive Care 74 Years and Older, Female Preventive care refers to lifestyle choices and visits with your health care provider that can promote health and wellness. What does preventive care include?  A yearly physical exam. This is also called an annual well check.  Dental exams once or twice a year.  Routine eye exams. Ask your health care provider how often you  should have your eyes checked.  Personal lifestyle choices, including:  Daily care of your teeth and gums.  Regular physical activity.  Eating a healthy diet.  Avoiding tobacco and drug use.  Limiting alcohol use.  Practicing safe sex.  Taking low-dose aspirin every day.  Taking vitamin and mineral supplements as recommended by your health care provider. What happens during an annual well check? The services and screenings done by your health care provider during your annual well check will depend on your age, overall health, lifestyle risk factors, and family history of disease. Counseling  Your health care provider may ask you questions about your:  Alcohol use.  Tobacco use.  Drug use.  Emotional well-being.  Home and relationship well-being.  Sexual activity.  Eating habits.  History of falls.  Memory and ability to understand (cognition).  Work and work Statistician.  Reproductive health. Screening  You may have the following tests or measurements:  Height, weight, and BMI.  Blood pressure.  Lipid and cholesterol levels. These may be checked every 5 years, or more frequently if you are over 80 years old.  Skin check.  Lung cancer screening. You may have this screening every year starting at age 50 if you have a 30-pack-year history of smoking and currently smoke or have quit within the past 15 years.  Fecal occult blood test (FOBT) of the stool. You may have this test every year starting at age 18.  Flexible sigmoidoscopy or colonoscopy. You may have a sigmoidoscopy every 5 years or a colonoscopy every 10 years starting at age 38.  Hepatitis C blood test.  Hepatitis B blood test.  Sexually transmitted disease (STD) testing.  Diabetes screening. This is done by checking your blood sugar (glucose) after you have not eaten for a  while (fasting). You may have this done every 1-3 years.  Bone density scan. This is done to screen for osteoporosis.  You may have this done starting at age 34.  Mammogram. This may be done every 1-2 years. Talk to your health care provider about how often you should have regular mammograms. Talk with your health care provider about your test results, treatment options, and if necessary, the need for more tests. Vaccines  Your health care provider may recommend certain vaccines, such as:  Influenza vaccine. This is recommended every year.  Tetanus, diphtheria, and acellular pertussis (Tdap, Td) vaccine. You may need a Td booster every 10 years.  Zoster vaccine. You may need this after age 43.  Pneumococcal 13-valent conjugate (PCV13) vaccine. One dose is recommended after age 61.  Pneumococcal polysaccharide (PPSV23) vaccine. One dose is recommended after age 77. Talk to your health care provider about which screenings and vaccines you need and how often you need them. This information is not intended to replace advice given to you by your health care provider. Make sure you discuss any questions you have with your health care provider. Document Released: 04/19/2015 Document Revised: 12/11/2015 Document Reviewed: 01/22/2015 Elsevier Interactive Patient Education  2017 [employer] Prevention in the Home Falls can cause injuries. They can happen to people of all ages. There are many things you can do to make your home safe and to help prevent falls. What can I do on the outside of my home?  Regularly fix the edges of walkways and driveways and fix any cracks.  Remove anything that might make you trip as you walk through a door, such as a raised step or threshold.  Trim any bushes or trees on the path to your home.  Use bright outdoor lighting.  Clear any walking paths of anything that might make someone trip, such as rocks or tools.  Regularly check to see if handrails are loose or broken. Make sure that both sides of any steps have handrails.  Any raised decks and porches should have  guardrails on the edges.  Have any leaves, snow, or ice cleared regularly.  Use sand or salt on walking paths during winter.  Clean up any spills in your garage right away. This includes oil or grease spills. What can I do in the bathroom?  Use night lights.  Install grab bars by the toilet and in the tub and shower. Do not use towel bars as grab bars.  Use non-skid mats or decals in the tub or shower.  If you need to sit down in the shower, use a plastic, non-slip stool.  Keep the floor dry. Clean up any water that spills on the floor as soon as it happens.  Remove soap buildup in the tub or shower regularly.  Attach bath mats securely with double-sided non-slip rug tape.  Do not have throw rugs and other things on the floor that can make you trip. What can I do in the bedroom?  Use night lights.  Make sure that you have a light by your bed that is easy to reach.  Do not use any sheets or blankets that are too big for your bed. They should not hang down onto the floor.  Have a firm chair that has side arms. You can use this for support while you get dressed.  Do not have throw rugs and other things on the floor that can make you trip. What can I do in the  kitchen?  Clean up any spills right away.  Avoid walking on wet floors.  Keep items that you use a lot in easy-to-reach places.  If you need to reach something above you, use a strong step stool that has a grab bar.  Keep electrical cords out of the way.  Do not use floor polish or wax that makes floors slippery. If you must use wax, use non-skid floor wax.  Do not have throw rugs and other things on the floor that can make you trip. What can I do with my stairs?  Do not leave any items on the stairs.  Make sure that there are handrails on both sides of the stairs and use them. Fix handrails that are broken or loose. Make sure that handrails are as long as the stairways.  Check any carpeting to make sure that  it is firmly attached to the stairs. Fix any carpet that is loose or worn.  Avoid having throw rugs at the top or bottom of the stairs. If you do have throw rugs, attach them to the floor with carpet tape.  Make sure that you have a light switch at the top of the stairs and the bottom of the stairs. If you do not have them, ask someone to add them for you. What else can I do to help prevent falls?  Wear shoes that:  Do not have high heels.  Have rubber bottoms.  Are comfortable and fit you well.  Are closed at the toe. Do not wear sandals.  If you use a stepladder:  Make sure that it is fully opened. Do not climb a closed stepladder.  Make sure that both sides of the stepladder are locked into place.  Ask someone to hold it for you, if possible.  Clearly mark and make sure that you can see:  Any grab bars or handrails.  First and last steps.  Where the edge of each step is.  Use tools that help you move around (mobility aids) if they are needed. These include:  Canes.  Walkers.  Scooters.  Crutches.  Turn on the lights when you go into a dark area. Replace any light bulbs as soon as they burn out.  Set up your furniture so you have a clear path. Avoid moving your furniture around.  If any of your floors are uneven, fix them.  If there are any pets around you, be aware of where they are.  Review your medicines with your doctor. Some medicines can make you feel dizzy. This can increase your chance of falling. Ask your doctor what other things that you can do to help prevent falls. This information is not intended to replace advice given to you by your health care provider. Make sure you discuss any questions you have with your health care provider. Document Released: 01/17/2009 Document Revised: 08/29/2015 Document Reviewed: 04/27/2014 Elsevier Interactive Patient Education  2017 [employer].

## 2020-09-11 DIAGNOSIS — L89153 Pressure ulcer of sacral region, stage 3: Secondary | ICD-10-CM | POA: Diagnosis not present

## 2020-09-11 DIAGNOSIS — E119 Type 2 diabetes mellitus without complications: Secondary | ICD-10-CM | POA: Diagnosis not present

## 2020-09-11 DIAGNOSIS — E440 Moderate protein-calorie malnutrition: Secondary | ICD-10-CM | POA: Diagnosis not present

## 2020-09-18 DIAGNOSIS — L89153 Pressure ulcer of sacral region, stage 3: Secondary | ICD-10-CM | POA: Diagnosis not present

## 2020-09-18 DIAGNOSIS — E440 Moderate protein-calorie malnutrition: Secondary | ICD-10-CM | POA: Diagnosis not present

## 2020-09-18 DIAGNOSIS — E119 Type 2 diabetes mellitus without complications: Secondary | ICD-10-CM | POA: Diagnosis not present

## 2020-09-19 DIAGNOSIS — H1045 Other chronic allergic conjunctivitis: Secondary | ICD-10-CM | POA: Diagnosis not present

## 2020-09-19 DIAGNOSIS — H353131 Nonexudative age-related macular degeneration, bilateral, early dry stage: Secondary | ICD-10-CM | POA: Diagnosis not present

## 2020-09-19 DIAGNOSIS — H25813 Combined forms of age-related cataract, bilateral: Secondary | ICD-10-CM | POA: Diagnosis not present

## 2020-09-19 DIAGNOSIS — H11823 Conjunctivochalasis, bilateral: Secondary | ICD-10-CM | POA: Diagnosis not present

## 2020-09-19 DIAGNOSIS — H16223 Keratoconjunctivitis sicca, not specified as Sjogren's, bilateral: Secondary | ICD-10-CM | POA: Diagnosis not present

## 2020-09-19 DIAGNOSIS — E119 Type 2 diabetes mellitus without complications: Secondary | ICD-10-CM | POA: Diagnosis not present

## 2020-09-25 DIAGNOSIS — E440 Moderate protein-calorie malnutrition: Secondary | ICD-10-CM | POA: Diagnosis not present

## 2020-09-25 DIAGNOSIS — E119 Type 2 diabetes mellitus without complications: Secondary | ICD-10-CM | POA: Diagnosis not present

## 2020-09-25 DIAGNOSIS — L89153 Pressure ulcer of sacral region, stage 3: Secondary | ICD-10-CM | POA: Diagnosis not present

## 2020-09-30 DIAGNOSIS — I10 Essential (primary) hypertension: Secondary | ICD-10-CM | POA: Diagnosis not present

## 2020-09-30 DIAGNOSIS — M545 Low back pain, unspecified: Secondary | ICD-10-CM | POA: Diagnosis not present

## 2020-09-30 DIAGNOSIS — R5381 Other malaise: Secondary | ICD-10-CM | POA: Diagnosis not present

## 2020-09-30 DIAGNOSIS — E119 Type 2 diabetes mellitus without complications: Secondary | ICD-10-CM | POA: Diagnosis not present

## 2020-09-30 DIAGNOSIS — K219 Gastro-esophageal reflux disease without esophagitis: Secondary | ICD-10-CM | POA: Diagnosis not present

## 2020-09-30 DIAGNOSIS — N1831 Chronic kidney disease, stage 3a: Secondary | ICD-10-CM | POA: Diagnosis not present

## 2020-09-30 DIAGNOSIS — M1990 Unspecified osteoarthritis, unspecified site: Secondary | ICD-10-CM | POA: Diagnosis not present

## 2020-10-02 ENCOUNTER — Encounter (HOSPITAL_BASED_OUTPATIENT_CLINIC_OR_DEPARTMENT_OTHER): Payer: PPO | Admitting: Physician Assistant

## 2020-10-02 DIAGNOSIS — L89153 Pressure ulcer of sacral region, stage 3: Secondary | ICD-10-CM | POA: Diagnosis not present

## 2020-10-02 DIAGNOSIS — E440 Moderate protein-calorie malnutrition: Secondary | ICD-10-CM | POA: Diagnosis not present

## 2020-10-02 DIAGNOSIS — E119 Type 2 diabetes mellitus without complications: Secondary | ICD-10-CM | POA: Diagnosis not present

## 2020-10-14 ENCOUNTER — Ambulatory Visit: Payer: PPO | Admitting: Internal Medicine

## 2020-10-16 ENCOUNTER — Encounter (HOSPITAL_BASED_OUTPATIENT_CLINIC_OR_DEPARTMENT_OTHER): Payer: PPO | Attending: Internal Medicine | Admitting: Physician Assistant

## 2020-10-16 ENCOUNTER — Other Ambulatory Visit: Payer: Self-pay

## 2020-10-16 DIAGNOSIS — L97122 Non-pressure chronic ulcer of left thigh with fat layer exposed: Secondary | ICD-10-CM | POA: Diagnosis not present

## 2020-10-16 DIAGNOSIS — I872 Venous insufficiency (chronic) (peripheral): Secondary | ICD-10-CM | POA: Diagnosis not present

## 2020-10-16 DIAGNOSIS — Z87891 Personal history of nicotine dependence: Secondary | ICD-10-CM | POA: Diagnosis not present

## 2020-10-16 DIAGNOSIS — E11622 Type 2 diabetes mellitus with other skin ulcer: Secondary | ICD-10-CM | POA: Diagnosis present

## 2020-10-16 DIAGNOSIS — I890 Lymphedema, not elsewhere classified: Secondary | ICD-10-CM | POA: Insufficient documentation

## 2020-10-16 DIAGNOSIS — Z885 Allergy status to narcotic agent status: Secondary | ICD-10-CM | POA: Insufficient documentation

## 2020-10-16 DIAGNOSIS — E1140 Type 2 diabetes mellitus with diabetic neuropathy, unspecified: Secondary | ICD-10-CM | POA: Diagnosis not present

## 2020-10-16 DIAGNOSIS — Z7901 Long term (current) use of anticoagulants: Secondary | ICD-10-CM | POA: Diagnosis not present

## 2020-10-16 DIAGNOSIS — Z880 Allergy status to penicillin: Secondary | ICD-10-CM | POA: Insufficient documentation

## 2020-10-16 DIAGNOSIS — I110 Hypertensive heart disease with heart failure: Secondary | ICD-10-CM | POA: Insufficient documentation

## 2020-10-16 DIAGNOSIS — Z833 Family history of diabetes mellitus: Secondary | ICD-10-CM | POA: Diagnosis not present

## 2020-10-16 DIAGNOSIS — I5042 Chronic combined systolic (congestive) and diastolic (congestive) heart failure: Secondary | ICD-10-CM | POA: Insufficient documentation

## 2020-10-16 NOTE — Progress Notes (Signed)
[name] [name], [name] [name] ([id number]) Visit Report for 10/16/2020 Chief Complaint Document Details Patient Name: Date of Service: [name] [name], [name] [name] 10/16/2020 2:45 PM Medical Record Number: [id number] Patient Account Number: [id number] Date of Birth/Sex: Treating RN: 10-Jul-1945 (75 y.o. Sue Lush Primary Care Provider: [name] Other Clinician: Referring Provider: Treating Provider/Extender: Dreama Saa in Treatment: 0 Information Obtained from: Patient Chief Complaint Left posterior upper leg ulcer Electronic Signature(s) Signed: 10/16/2020 4:00:10 PM By: Worthy Keeler PA-C Entered By: Worthy Keeler on 10/16/2020 16:00:10 -------------------------------------------------------------------------------- HPI Details Patient Name: Date of Service: [name] [name], [name] [name] 10/16/2020 2:45 PM Medical Record Number: [id number] Patient Account Number: [id number] Date of Birth/Sex: Treating RN: [birth year] (75 y.o. Sue Lush Primary Care Provider: [name] Other Clinician: Referring Provider: Treating Provider/Extender: Dreama Saa in Treatment: 0 History of Present Illness HPI Description: 04/09/15; the patient is here for review of wounds on her bilateral lower extremities for the last month. The patient has [name] history of lymphedema and bilateral chronic venous insufficiency with inflammation she was here for review of wounds on her bilateral legs in 2014 she was discharged home with external compression pumps which she is currently not using. Over the last month she developed wounds on her bilateral lower legs with drainage and pain and she is here for our review of this. 04/16/15; the patient's edema is under much better control. She has 3 areas one on the right anterior medial leg one on the left posterior leg and [name] small open area with purulent drainage on the left anterior leg. I have cultured the area on  the left anterior leg 04/23/15; continued improvement in the patient's edema. All her wounds of closed I see no open areas. Culture from the left anterior leg last time was negative. READMISSION 02/07/16; this patient is [name] woman that I saw her earlier in 2017. I believe we discharged her very quickly with bilateral wounds in her legs secondary to chronic venous hypertension with inflammation and secondary lymphedema. She had juxtalite stockings. She has external compression pumps at home from [name] stay in our clinic in 2014. She tells me that she's had wounds on her bilateral lower legs since March or April of this year. 3 wounds on the left 2 on the right. She is [name] type II diabetic and has [name] history of noncompressible vessels bilaterally although she is tolerated 4-layer compression. The last note from her primary physician Dr. [name] was in May. At that point Dr. [name] had referred the patient here for lymphedema management. The patient stated she made [name] phone call to year but was not able to arrange an appointment. It is not clear that she is actually using anything on the wounds currently except some gauze that was saturated with drainage per our intake nurse. She is certainly not using her juxtalite stockings and by the patient's own admission she is not using her compression pumps because they are "irritating". I don't see [name] recent hemoglobin A1c. The patient states she has not been systemically unwell but the legs are painful 02/14/16; patient tolerated her Profore wraps reasonably well. She is using the compression pumps 1 time per day. Measurements of her legs are better in terms of the amount of lymphedema 02/21/16; patient tolerated her Profore wraps it. She says she is using her compression pumps one or 2 times [name] day. Measurements of her leg circumference are improved and most of her wound dimensions are better,  unfortunately the home health that we referred her to did not  except her insurance but works successful in getting her accompanied that would except her insurance however she apparently has [name] $25 co-pay which she cannot afford as she is on [name] fixed income 03/05/16; patient states she did not back using the pumps. She is healed that 2 wounds on the left leg. She still has areas on the right lateral lower leg, right medial lower leg and left lateral lower leg. All of these look some better and have reduced in area 03/13/16; still 3 wounds 1 on the left lateral leg, one on the left medial/posterior leg which is really the most extensive and [name] small area on the right lateral leg. All of this in the setting of severe chronic lymphedema and chronic venous inflammation. Damage to the skin with cobblestone thickened skin. 03/20/16; still 2 areas. The area on the left lateral leg appears to of closed over. The right medial leg is still most extensive wound although it appears to be closing over right lateral only [name] small open area remains. Using silver alginate Profore wraps 03/27/16; the area on the left leg remains closed. The edema control is better on the left than the right. The right medial leg is still weeping but everything appears to be epithelialized. The lateral aspect of the right leg appears closed. She tells me she has old juxtalite stockings. She has been compliant with her compression pumps. 04/03/16; the area on the left leg remains closed. She has ordered new juxtalite stockings. The right posterior medial wound is fully epithelialized. However she has an irritated area over the right Achilles that she states is been itching all week under the wraps.. She has been compliant with her compression pumps 04/10/16; the area on the left leg remains closed. The right posterior medial wound is also closed today. The irritated area over the right Achilles which was probably wrap injury is also fully epithelialized and closed. The patient has not received her new  juxtalite stockings, she arrives in clinic today with an old juxtalite stocking in place. Sheis using her compression pumps secondary to her severe stage III lymphedema READMISSION 01/21/17; this is [name] patient we know from several previous stays in this clinic. She has lymphedema with chronic venous insufficiency and inflammation. The last time she was here we discharged her with bilateral juxta lites which she is compliant with. She also has external compression pumps at home from stays in our clinic in 2014 although she tells me that she is not very compliant with these as when she uses them [name] causes pain in her bilateral knees the patient states that her wounds on her left greater than right leg including 2 large areas on the left anterior and left lateral leg and an area on the right leg open in late June or July since then she is been followed by [facility] and she's had bilateral Unna boot supplied every week for the last month.. I think the wounds have actually improved although they are not specifically dressed. She states she had an ultrasound to rule out DVT bilaterally that was negative. She does not have arterial studies although she is [name] diabetic. She states she has [name] lot of pain in her legs she states she does not use her external compression pumps because it causes pain in her knees. As usual her ABIs were noncompressible bilaterally in this clinic 01/28/17; currently the patient only has one small wound on  the left lateral leg and I think this should be healed next week. We are going to try to get her bilateral extremitease stockings. She tells me that compression pumps heard her posterior rib arthritic knees so she is not been compliant with this 02/04/17; the patient's wounds are totally healed. She has chronic venous insufficiency and secondary lymphedema. She has new extremitease stockings. She also has compression pumps Readmission: 04/21/17 on evaluation today patient appears  to be doing about the same as what she was previous when we were evaluating her back in November 2018. At that point she had completely healed. With that being said she has now reopened and states that she wasn't aware that she was supposed to come back. 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 Subsequently she also states that although she has the compression that we got for her previously the extremitease stockings she nonetheless doesn't always wear these and being noncompliant often has things will reopen as far as ulcers on her lower extremities. No fevers, chills, nausea, or vomiting noted at this time. Patient has no evidence of dementia. She is having some discomfort in regard to the ulcers at this point. 04/28/17 on evaluation today patient appears to be doing significantly better in regard to her wounds. With that being said she is still having significant discomfort and is not really keen on sharp debridement. She does not appear to have any evidence of infection which is good news I do think the Iodoflex is beneficial for her. Of note her ABI's were found in the system to be on the right 1.21 with [name] TBI of 0.76 and on the left 1.35 with [name] TBI of 0.75. In general her swelling appears to be significantly improved however. No fevers, chills, nausea, or vomiting noted at this time. Patient has been tolerating the wraps without complication. She has no evidence of dementia. 05/05/17-she is here in follow-up evaluation for bilateral lower extremity venous and lymphedema ulcers. She states she has not been using her lymphedema pumps secondary to pain at her knees. After discussion it was noted that she uses knee-high lymphedema pumps but does have [name] pair of thigh-high lymphedema pumps. She was strongly advised to use the thigh-high lymphedema pumps and if necessary reduce the setting for  improved tolerance. She reports no change in odor and/or drainage. She continues to tolerate sharp debridement poorly with significant pain, primarily to the right lower extremity. We will continue with current treatment plan, along with improved lymphedema pump use and follow-up next week 05/12/17 on evaluation today patient actually appears to be doing much better in regard to her bilateral lower extremity ulcers. Only the right altar actually requires debridement today. The other two cleaned off nicely in two of the three in fact on the left lower extremity or actually almost completely healed. She does have some weeping noted still the bilateral lower extremities left greater than right. She still is not able to use her compression pumps even though I have mentioned this several times to her as did Leah last week. No fevers, chills, nausea, or vomiting noted at this time. Patient has been tolerating the dressing changes without complication. Patient has no evidence of dementia 05/19/17 on evaluation today patient appears to be doing very well regard to her bilateral lower extremity ulcers. In fact she seems to be improving and in fact healed in [name] couple  of locations that were giving her trouble last week. Overall I'm extremely pleased with how things have progressed. She still has some discomfort especially in the right lateral lower extremity. Fortunately there does not appear to be any evidence of significant infection which is good news she does tell me that she has used her compression pumps several times although not routinely over the past week I did praise her for this. I do think it will be beneficial and that might even be what's helping with her wounds as far as healing is concerned at least to [name] degree. No fevers, chills, nausea, or vomiting noted at this time. Patient has no dementia and no evidence of infection. 05/26/17 on evaluation today patient presents for evaluation concerning her  bilateral lower extremity ulcer secondary to lymphedema. Fortunately she appears to be doing very well at this point in time. Specifically her right leg appears to be completely closed and healed. The left leg though there is still [name] small area of opening overall has healed. She does have [name] little bit of excoriation at the top or the wraps are because they slid down on her. Otherwise there's no evidence of infection and the other has improved dramatically now that she is not draining as significantly. Patient is having no significant pain she tells me she has been able to use her lymphedema pumps one time [name] day although that's not all she can tolerate. Even that is difficult for her. 06/02/17 on evaluation today patient's ulcers appear to be completely closed at this point. She does still note some odor but I believe this is due to the fact she has not been able to wash her legs as well currently. Fortunately she did bring her compression with her today. She does have the EXTREMIT-EASE Compression. With that being said she is somewhat nervous about going back to this as previously she broke out yet again once we discontinue wrapping her. READMISSION 11/23/17; patient is now 75 year old diabetic woman with severe chronic venous insufficiency with lymphedema. We care for her last in this clinic in February 2019 at which time she had chronic venous insufficiency with lymphedema bilateral lower extremity ulcers. We discharged her with bilateral wrap around/extremitease stockings and compression pumps. Is fairly clear she is not using the compression pumps stating it causes pain in her knees. In any case she was admitted to hospital from 8/3 through 8/6 with [name] several week history of recurrent bilateral ulcers. She was felt to have bilateral cellulitis treated with doxycycline IV. She is still on oral doxycycline and has another 2 days worth. At discharge her white count was 14.1 hemoglobin at 8.9. The  patient is [name] diabetic however her last ABIs in 2018 showed [name] right ABI of 1.21, left of 1.35. TBI's were 0.76 on the right 0.75 on the left. Posterior tibial artery was monophasic on the right triphasic on the left. Dorsalis pedis triphasic on the right and triphasic on the left. She is not felt to have significant macrovascular disease 12/02/17;significant bilateral lower extremity wounds secondary to severe uncontrolled lymphedema. She did not get home health out to change the wraps. So she kept this on all week. She is telling me she using his her compression pumps once [name] week. In general all the wounds look better. We've been using silver alginate with secondary absorptive dressings 4 layer compression 12/10/17; bilateral lower extremity wounds secondary to uncontrolled lymphedema. She tells me she is using her compression pumps on most days. We've  been keeping wraps on all week. Using silver alginate. She has wounds on the right lateral calf left medial and left lateral calf. All of this is somewhat better 12/16/17; the patient has 3 small open areas. One on the right lateral calf, one on the left medial calf and the small area on the left lateral calf. She has lymphedema, chronic venous insufficiency with hemosiderin deposition. Very fibrotic distal lower extremity scan with suggestion of an inverted bottle sign appearance to her legs. She has compression pumps but she does not use them has not used them in the last week. Currently we have her in 4 layer compression, silver alginate to the wounds and this is being changed once by home health. She is noncompliant with the compression pumps because she says it hurts her knees. She also has compression stockings ando Juxta light stockings at home 12/24/17; the patient's right lateral calf is healed as well as the left medial. There is no open wound on the right leg. She still has 2 areas on the left lateral calf. We transitioned the right leg into an  extremitease stocking which she brought in from home. 12/31/17; the patient has [name] superficial wound on the left lateral lower calf. We've been wrapping this leg. She arrives today with the right leg swollen but without [name] wound. She cannot get her extremities stocking on the right leg. There is [name] lot more edema here. She is not using her compression pumps 01/10/2018; patient has [name] same superficial wound on the left lateral calf. Her right leg has less peripheral edema. She tells me she also started herself on some Lasix that had been previously prescribed for her at some point but she had never taken it. She is also concerned about whether her compression pumps are leaking. She has extremitease stockings however the edema gets bad enough she can get these on. Currently she has no open wound on the right leg and is mention the edema is actually better than when I saw this 10 days ago 01/17/2018; patient has [name] same superficial area on the posterior left calf and [name] weeping area in the mid part of the right lateral calf. She still does not have anyone from [facility] coming to see her compression pumps which she describes as being suspicious for [name] leakage. I am not really sure how frequently she is using this in any case. She has massive bilateral calf lymphedema. I think there is some spreading lymphedema into her posterior thighs. 01/24/2018; the patient's edema in her legs is actually some better. She still has [name] smaller area on the left posterior calf although this is better. The weeping area on the right lateral calf is also better. As it turned out she did not get her pumps from [facility] but [facility] and they are going to try to go out and see these this week which is going to help. I explained to her that she will use the compression pumps once [name] day while we are wrapping her legs but when she transitions to stockings that may be she will require pumping twice [name] day. Her  compliance in this area has not been high and I wonder about her ability to do this herself. 02/01/2018; the area on the posterior left leg is healed. She still has the original wound on the right lateral calf which is epithelialized. The however just above this there is still weeping lymphedema fluid. She has [name] new area on the upper  right calf which is either wrap injury or is she points out where she has been scratching under the wraps. We still not have managed to get [name] hold of [name] representative of [facility] to see if the pump is operational. She has extremitease stockings at home however I am not of the opinion that this will maintain her skin integrity without the compression pumps. 02/08/2018; posterior left leg remains healed although she has [name] new small open area on the left lateral calf. Right lateral calf has two quarter sized open areas and close juxtaposition. There is less weeping edema fluid. She apparently managed to contact [facility]. They are sending her [name] part. But she is still not received it 02/15/2018; the patient has no open wound on either leg. Some scale present on the right upper lateral calf just below the fibular head. Edema control is excellent. She did not bring her stockings or her juxta lite wrap around. She has the part for her compression pump but she has not use the compression pumps yet READMISSION 01/10/2019 Patient is now [name] 75 year old woman that we have had in the clinic multiple times in the past. She has chronic lower extremity lymphedema, recurrent wounds on her bilateral lower extremities. She is also [name] type II diabetic reasonably well controlled with [name] recent hemoglobin A1c while she was in the hospital of 5.9. She has wounds on her bilateral lower extremities mostly on the left lateral and right lateral. These were noted also during her recent hospitalization. The patient states they have been there for [name] while. She has not been able to wear  her stockings he has trouble getting them on as she lives alone. She also has external compression pumps but uses them sparingly because it causes pain in her knees which hurt because of osteoarthritis. The patient was on doxycycline before she went into the hospital apparently for fear of infection in her legs and is on Keflex coming out because of [name] UTI The patient was recently in the hospital from 01/06/2019 through 01/09/2019. She was admitted with nausea and vomiting and prerenal azotemia. This was corrected with IV fluid her Lasix has been put on hold. Hemoglobin A1c at 5.9 her metformin was discontinued. She was also felt to have [name] UTI she was prescribed Keflex at 503 times [name] day she is picking that up today. The patient is not felt to have an arterial issue. ABIs in our clinic were 1.24 on the right and 1.06 on the left. She had formal studies in 2018 at which time her ABIs were 1.21 on the right TBI of 0.76 and on the left 1.35 and 0.75 respectively. Waveforms are on the right were monophasic and biphasic, triphasic and biphasic on the left. She is tolerated 4 layer compression in the past 10/13. Comes in today having not used her compression pumps /perhaps once in the last week. She has too much edema to consider healing these wounds. We have been using silver alginate 10/20; she tells me he had she used her compression pumps once [name] day as we asked. Clearly she has edema out of her legs although most of it seems to be settling around her knees which is what she complains about. We have been using silver alginate to the extensive de-epithelialized area on the left lateral calf and [name] much smaller area on the right lateral extending linearly into the right posterior 10/27; she is using her compression pumps daily. Her edema control is better. We  have been using silver alginate to the extensive wound areas on her bilateral lower legs. This includes left anterior left lateral and left posterior.  Right anterior right lateral and right posterior. The wounds are not all in the same condition. She has severe bilateral skin damage with the epithelialization, flaking dried epithelium 11/3; she is using her compression pumps 1 times daily. Pretty obvious that her wraps fell down especially on the left leg to mid calf. I think things are improving on the right lateral calf, I do not see anything open posteriorly. The major areas on the left lateral where she has 3 or 4 deeper wounds in the midst of an entire de-epithelialized area. There is erythema here but no tenderness I think this represents venous stasis rather than cellulitis. I debrided the areas on the left lateral calf last week but once again they have tightly adherent debris over the top of them which is disappointing 11/10; the patient's major areas on the left lateral calf to 3 wounds with some depth surrounded by [name] large area of de-epithelialized tissue. There is no infection. We have been using silver alginate under 4-layer compression. On the right lateral she has [name] more contained wound area that is superficial. Been using silver alginate under 4-layer compression 11/17; the patient's major wound is on the left lateral calf. We have been using silver alginate under compression. There is no open wound per se on the right but it de-epithelialized area on the lateral calf that is weeping edema fluid. I do not see much change here today. 12/1; patient arrives in clinic with her wounds looking better. Her edema control is better. The lymphedema specialist working on the left leg we have been working on the right. We have been using 4-layer compression. The patient is using [name] consider external compression pumps and Hydrofera Blue to the open areas. She is being measured for an abdominal compression pump and apparently that is going to go through her insurance 12/15; continued improvement. 2 small areas on the left and slightly larger 2 small  areas on the right. 12/22; patient still has open areas on the right lateral calf which are superficial and the new one on the right anterior calf. She did not use her compression pumps this week. Her left leg is wrapped by our lymphedema specialist. There are no open wounds here 12/29; she still has [name] superficial area on the right lateral calf and area on the right medial calf. Right anterior wound area appears to have closed. She has much better edema control today than last week 04/11/2019; the patient is here with severe bilateral lymphedema, chronic venous insufficiency with marked skin changes in her lower extremities. We are wrapping her right leg and her lymphedema specialist is putting lymphedema wraps on the left. She states she is using her compression pumps although in the past her compliance with uses been limited because of osteoarthritic pain in her knees. She only has one remaining area on the right leg however she has wrap injuries on the left at the upper level what where I think her wraps were cause. the left lateral area left posterior. These would be new this week 1/12; generally better. The wrap injuries in the upper calf on the left from last week are down to [name] single wound. She has 2 wounds anteriorly that are still weeping fluid although these appear to have been epithelialized surface for the most part. There is no open wounds on the right leg  we are wrapping her right leg and 4-layer compression and we are going [name] lymphedema wrap on the left leg. The the approach is [name] F[place] 2000 wrap on the left leg and then subsequently the right leg. She claims to be compliant with her compression pumps 1/19; the patient has 1 tiny open area on the right medial calf. The rest of this is not open. She however has [name] large erythematous denuded area on the left anterior tibia extending laterally. There is no evidence of infection here. She has her new current waist high compression pumps 1/26;  the patient has no open wounds on the right. On the left anteriorly much better than last week still [name] smattering of small weeping open areas. There is no evidence of infection 2/2; still no open wounds on the right leg. He still does not have [name] external compression garment. She arrives in clinic today with an open area on the plantar aspect of the left second toe at the base of the PIP. This apparently was painful last week so it was not wrapped by our lymphedema specialist. There was no open wound at the time. The patient denies traumatizing the toe however today she arrives with [name] small but deep wound in this area the dorsal surface of the toe is very tender. 2/11; she arrives in clinic today with everything epithelialized. She was that way last week on the right but we have still been wrapping. She has been using lymphedema wraps on the left. On careful inspection of the left leg she still has areas that look vulnerable but no clearly open wound there may be some weeping through cracks in the skin. She is using above the waist external compression pumps and she has [name] separate external compression garment for her thighs. The plan is to discharge her for follow-up in lymphedema clinic with Juliann Pulse our lymphedema specialist. In the past healing this woman has resulted in relatively quick breakdown however she generally seems more motivated example willing to use her external compression pumps etc. READMISSION 02/13/2020. Mrs. [name] [name] is [name] now 75 year old woman who has bilateral lower extremity lymphedema and [name] history of difficult to treat bilateral lower extremity wounds. When she was here last time we had her seen by [name] lymphedema specialist. She had lower extremity compression stockings as well as compression garments for her thighs. She has compression pumps as well. Unfortunately she has not been doing well. She describes unrelenting nausea with vomiting anytime she tries to eat or drink or take her  medications. She was seen in urgent care on 10/26. Notably they found her to be somewhat dehydrated. Gave her [name] liter of fluid. Her creatinine was up to 2.42. The rest of her lab work including liver function tests, white count, hemoglobin were all normal. She did have [name] urine for culture that showed Klebsiella she was prescribed ciprofloxacin but I seriously doubt that she is able to keep this down by her description. She has not taken any of her meds in over [name] month. She states she feels too weak to take care of her lower extremities with compression garments and her external compression pumps she has not been using any of these. As [name] result she has 2 large areas on the left lateral calf one anteriorly [name] cluster of 3 on the right lateral and [name] small area on her right ankle. All of these are superficial. She has poorly controlled lymphedema especially on the anterior tibial areas however by my my  anxiety she actually looks like she has less swelling than what I remember. Past medical history includes type 2 diabetes with [name] recent hemoglobin A1c of 6.8 chronic venous insufficiency, lymphedema which she was managing with above the waist compression and compression pumps, diastolic congestive heart failure. Nausea and vomiting as described above. We did not repeat her arterial studies today however these were quite normal when they were last done in 2018 with an ABI of 1.21 on the right and 1.35 on the left. TBI's were within normal limits at 0.76 and 0.75 she had triphasic waveforms on the left monophasic waveforms at the posterior tibial but triphasic waveforms at the dorsalis pedis. She has previously tolerated 4 layer compression 11/18; the patient has [name] cluster of wounds on the right lateral lower calf and areas on the left lateral calf. We have been using silver alginate under compression she seems to be tolerating this well. Her edema is improved. HOWEVER she arrives in clinic today still with [name]  description of bilious vomiting that is making it impossible for her to eat and drink. Her vitals are stable however she looks more gaunt. I have tried to talk her into going to the emergency room to be checked out however she will not hear anything of it 11/29; the patient's right leg is closed. On the left leg she has 2 small superficial open areas anteriorly and one just to the lateral part of the tibia. These are just about closed. She did go to the ER with regards to her persistent nausea and vomiting. Apparently they did not find anything and did [name] CT scan of her abdomen although I have not yet had [name] chance to review these records Readmission: 10/16/2020 upon evaluation today patient presents for reevaluation here in the clinic due to issues that she is can have one of the bilateral lower extremities. More importantly she has an issue which looks to be more of [name] friction injury to be honest in the posterior upper thigh on the left. Again this appears to potentially be where [name] brief could have been pulled [name] little bit causing [name] friction injury. Again it does not look like [name] pressure injury at all very superficial not really draining much at all. Fortunately there does not appear to be any signs of active infection at this time which is great news. No fevers, chills, nausea, vomiting, or diarrhea. Patient does have [name] history of bilateral lower extremity lymphedema, hypertension, congestive heart failure, and long-term use of anticoagulant therapy. She is currently on Eliquis. Unfortunately since we last seen her she apparently ended up apparently fallen in her home and she was subsequently found on the ground by her daughter admitted to the hospital for altered mental status and weakness she was found to have [name] UTI and Warnicke's encephalopathy. Upon discharge mental status had improved she had fevers which were thought to be potentially due to pseudogout flare. She did improve with steroids. She  also had an EGD which showed reflux esophagitis and multiple large and small nonbleeding duodenal ulcers. She was discharged [facility] 04/08/2020 [name] she has been there since. It was her daughter who actually found her as she was not able to get in touch with her mom. For that reason she called the police for [name] safety check and found her lying on the floor covered with stool and bugs crawling all over her. She was seen by her PCP for intractable nausea and vomiting 4 to  6 weeks prior. She was found to have profound weakness which honestly has not really improved much at this point. Her CT scan was negative. Electronic Signature(s) Signed: 10/16/2020 4:41:33 PM By: Worthy Keeler PA-C Entered By: Worthy Keeler on 10/16/2020 16:41:33 -------------------------------------------------------------------------------- Physical Exam Details Patient Name: Date of Service: [name] [name], [name] [name] 10/16/2020 2:45 PM Medical Record Number: [id number] Patient Account Number: [id number] Date of Birth/Sex: Treating RN: 03/21/1946 (75 y.o. Sue Lush Primary Care Provider: [name] Other Clinician: Referring Provider: Treating Provider/Extender: Dreama Saa in Treatment: 0 Constitutional patient is hypertensive.. pulse regular and within target range for patient.Marland Kitchen respirations regular, non-labored and within target range for patient.Marland Kitchen temperature within target range for patient.. Well-nourished and well-hydrated in no acute distress. Eyes conjunctiva clear no eyelid edema noted. pupils equal round and reactive to light and accommodation. Ears, Nose, Mouth, and Throat no gross abnormality of ear auricles or external auditory canals. normal hearing noted during conversation. mucus membranes moist. Respiratory normal breathing without difficulty. Cardiovascular 2+ dorsalis pedis/posterior tibialis pulses. no clubbing, cyanosis, significant edema, <3 sec cap  refill. Musculoskeletal Patient unable to walk. Psychiatric this patient is able to make decisions and demonstrates good insight into disease process. Alert and Oriented x 3. pleasant and cooperative. Notes Upon inspection patient's wound bed actually showed signs of being decently well overall in regard to her legs she has been having Curlex and Coban wraps placed that are in place pretty much daily that were cut off before she came here for some reason. Nonetheless that something that we will take care of today as well. With that being said the patient also tells me that she has been frustrated with the fact that her mental status that she feels okay she feels like others trigger like there is not much that she really can understand. This seems to be very frustrating for her. She also does not feel very comfortable she tells me that there is an [place] gentleman that keeps coming into her room and this is causing no end of stress for her as well. Overall this is very unfortunate not feel extremely sorry for her honestly she is trying to do which she can she tells me to build up strength in her legs but she is having [name] very difficult time being able to do so. She is not able to stand or walk at all. Electronic Signature(s) Signed: 10/16/2020 4:42:43 PM By: Worthy Keeler PA-C Entered By: Worthy Keeler on 10/16/2020 16:42:43 -------------------------------------------------------------------------------- Physician Orders Details Patient Name: Date of Service: [name] [name], [name] [name] 10/16/2020 2:45 PM Medical Record Number: [id number] Patient Account Number: [id number] Date of Birth/Sex: Treating RN: September 25, 1945 (75 y.o. 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 - with Margarita Grizzle **Room 5-Stretcher** Other: - Patient resides at [facility] Shower/ Hygiene May shower and wash wound with soap and water. Edema Control - Lymphedema / SCD / Other Bilateral Lower Extremities Elevate legs to the level of the heart or above for 30 minutes daily and/or  when sitting, [name] frequency of: Moisturize legs daily. Other Edema Control Orders/Instructions: - Apply Kerlix/Coban 2 layer wrap to bilateral legs, change 2x week. Off-Loading Other: - Try to minimize friction/shearing to area Additional Orders / Instructions Follow Nutritious Diet Wound Treatment Wound #47 - Upper Leg Wound Laterality: Left, Posterior Cleanser: Soap and Water 3 x Per Week/30 Days Discharge Instructions: May shower and wash wound with dial antibacterial soap and water prior to dressing change. Cleanser: Wound Cleanser 3 x Per Week/30 Days Discharge Instructions: Cleanse the wound with wound cleanser prior to applying [name] clean dressing using gauze sponges, not tissue or cotton balls. Prim Dressing: Xeroform Occlusive Gauze Dressing, 4x4 in 3 x Per Week/30 Days ary Discharge Instructions: Apply to wound bed as instructed Secondary Dressing: Zetuvit Plus Silicone Border Dressing 4x4 (in/in) 3 x Per Week/30 Days Discharge Instructions: Or equivalent bordered foam Electronic Signature(s) Signed: 10/16/2020 5:19:59 PM By: Worthy Keeler PA-C Signed: 10/16/2020 6:05:01 PM By: Lorrin Jackson Entered By: Lorrin Jackson on 10/16/2020  16:31:55 -------------------------------------------------------------------------------- Problem List Details Patient Name: Date of Service: [name] [name], [name] [name] 10/16/2020 2:45 PM Medical Record Number: [id number] Patient Account Number: [id number] Date of Birth/Sex: Treating RN: 1945/06/23 (75 y.o. 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 10/16/2020 No Yes I10 Essential (primary) hypertension 10/16/2020 No Yes I50.42 Chronic combined systolic (congestive) and diastolic (congestive) heart failure 10/16/2020 No Yes Z79.01 Long term (current) use of anticoagulants 10/16/2020 No Yes Inactive Problems Resolved Problems Electronic Signature(s) Signed: 10/16/2020 4:10:09 PM By: Worthy Keeler PA-C Signed: 10/16/2020 4:10:09 PM By: Worthy Keeler PA-C Previous Signature: 10/16/2020 3:59:50 PM Version By: Worthy Keeler PA-C Entered By: Worthy Keeler on 10/16/2020 16:10:09 -------------------------------------------------------------------------------- Progress Note Details Patient Name: Date of Service: [name] [name], [name] [name] 10/16/2020 2:45 PM Medical Record Number: [id number] Patient Account Number: [id number] Date of Birth/Sex: Treating RN: October 03, 1945 (75 y.o. Sue Lush Primary Care Provider: [name] Other Clinician: Referring Provider: Treating Provider/Extender: Dreama Saa in Treatment: 0 Subjective Chief Complaint Information obtained from Patient Left posterior upper leg ulcer History of Present Illness (HPI) 04/09/15; the  patient is here for review of wounds on her bilateral lower extremities for the last month. The patient has [name] history of lymphedema and bilateral chronic venous insufficiency with inflammation she was here for review of wounds on her bilateral legs in 2014 she was discharged home with external compression pumps which she is currently not using. Over the last month she developed wounds on her bilateral lower legs with drainage and pain and she is here for our review of this. 04/16/15; the patient's edema is under much better control. She has 3 areas one on the right anterior medial leg one on the left posterior leg and [name] small open area with purulent drainage on the left anterior leg. I have cultured the area on the left anterior leg 04/23/15; continued improvement in the patient's edema. All her wounds of closed I see no open areas. Culture from the left anterior leg last time was negative. READMISSION 02/07/16; this patient is [name] woman that I saw her earlier in 2017. I believe we discharged her very quickly with bilateral  wounds in her legs secondary to chronic venous hypertension with inflammation and secondary lymphedema. She had juxtalite stockings. She has external compression pumps at home from [name] stay in our clinic in 2014. She tells me that she's had wounds on her bilateral lower legs since March or April of this year. 3 wounds on the left 2 on the right. She is [name] type II diabetic and has [name] history of noncompressible vessels bilaterally although she is tolerated 4-layer compression. The last note from her primary physician Dr. [name] was in May. At that point Dr. [name] had referred the patient here for lymphedema management. The patient stated she made [name] phone call to year but was not able to arrange an appointment. It is not clear that she is actually using anything on the wounds currently except some gauze that was saturated with drainage per our intake nurse. She is certainly not  using her juxtalite stockings and by the patient's own admission she is not using her compression pumps because they are "irritating". I don't see [name] recent hemoglobin A1c. The patient states she has not been systemically unwell but the legs are painful 02/14/16; patient tolerated her Profore wraps reasonably well. She is using the compression pumps 1 time per day. Measurements of her legs are better in terms of the amount of lymphedema 02/21/16; patient tolerated her Profore wraps it. She says she is using her compression pumps one or 2 times [name] day. Measurements of her leg circumference are improved and most of her wound dimensions are better, unfortunately the home health that we referred her to did not except her insurance but works successful in getting her accompanied that would except her insurance however she apparently has [name] $25 co-pay which she cannot afford as she is on [name] fixed income 03/05/16; patient states she did not back using the pumps. She is healed that 2 wounds on the left leg. She still has areas on the right lateral lower leg, right medial lower leg and left lateral lower leg. All of these look some better and have reduced in area 03/13/16; still 3 wounds 1 on the left lateral leg, one on the left medial/posterior leg which is really the most extensive and [name] small area on the right lateral leg. All of this in the setting of severe chronic lymphedema and chronic venous inflammation. Damage to the skin with cobblestone thickened skin. 03/20/16; still 2 areas. The area on the left lateral leg appears to of closed over. The right medial leg is still most extensive wound although it appears to be closing over right lateral only [name] small open area remains. Using silver alginate Profore wraps 03/27/16; the area on the left leg remains closed. The edema control is better on the left than the right. The right medial leg is still weeping but everything appears to be epithelialized. The  lateral aspect of the right leg appears closed. She tells me she has old juxtalite stockings. She has been compliant with her compression pumps. 04/03/16; the area on the left leg remains closed. She has ordered new juxtalite stockings. The right posterior medial wound is fully epithelialized. However she has an irritated area over the right Achilles that she states is been itching all week under the wraps.. She has been compliant with her compression pumps 04/10/16; the area on the left leg remains closed. The right posterior medial wound is also closed today. The irritated area over the right Achilles which was probably wrap injury is also  fully epithelialized and closed. The patient has not received her new juxtalite stockings, she arrives in clinic today with an old juxtalite stocking in place. Sheis using her compression pumps secondary to her severe stage III lymphedema READMISSION 01/21/17; this is [name] patient we know from several previous stays in this clinic. She has lymphedema with chronic venous insufficiency and inflammation. The last time she was here we discharged her with bilateral juxta lites which she is compliant with. She also has external compression pumps at home from stays in our clinic in 2014 although she tells me that she is not very compliant with these as when she uses them [name] causes pain in her bilateral knees the patient states that her wounds on her left greater than right leg including 2 large areas on the left anterior and left lateral leg and an area on the right leg open in late June or July since then she is been followed by [facility] and she's had bilateral Unna boot supplied every week for the last month.. I think the wounds have actually improved although they are not specifically dressed. She states she had an ultrasound to rule out DVT bilaterally that was negative. She does not have arterial studies although she is [name] diabetic. She states she has [name] lot of pain in  her legs she states she does not use her external compression pumps because it causes pain in her knees. As usual her ABIs were noncompressible bilaterally in this clinic 01/28/17; currently the patient only has one small wound on the left lateral leg and I think this should be healed next week. We are going to try to get her bilateral extremitease stockings. She tells me that compression pumps heard her posterior rib arthritic knees so she is not been compliant with this 02/04/17; the patient's wounds are totally healed. She has chronic venous insufficiency and secondary lymphedema. She has new extremitease stockings. She also has compression pumps Readmission: 04/21/17 on evaluation today patient appears to be doing about the same as what she was previous when we were evaluating her back in November 2018. At that point she had completely healed. With that being said she has now reopened and states that she wasn't aware that she was supposed to come back. 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 Subsequently she also states that although she has the compression that we got for her previously the extremitease stockings she nonetheless doesn't always wear these and being noncompliant often has things will reopen as far as ulcers on her lower extremities. No fevers, chills, nausea, or vomiting noted at this time. Patient has no evidence of dementia. She is having some discomfort in regard to the ulcers at this point. 04/28/17 on evaluation today patient appears to be doing significantly better in regard to her wounds. With that being said she is still having significant discomfort and is not really keen on sharp debridement. She does not appear to have any evidence of infection which is good news I do think the Iodoflex is beneficial for her. Of note her ABI's were found in the system to be on  the right 1.21 with [name] TBI of 0.76 and on the left 1.35 with [name] TBI of 0.75. In general her swelling appears to be significantly improved however. No fevers, chills, nausea, or vomiting noted at this time. Patient has been tolerating the wraps without complication.  She has no evidence of dementia. 05/05/17-she is here in follow-up evaluation for bilateral lower extremity venous and lymphedema ulcers. She states she has not been using her lymphedema pumps secondary to pain at her knees. After discussion it was noted that she uses knee-high lymphedema pumps but does have [name] pair of thigh-high lymphedema pumps. She was strongly advised to use the thigh-high lymphedema pumps and if necessary reduce the setting for improved tolerance. She reports no change in odor and/or drainage. She continues to tolerate sharp debridement poorly with significant pain, primarily to the right lower extremity. We will continue with current treatment plan, along with improved lymphedema pump use and follow-up next week 05/12/17 on evaluation today patient actually appears to be doing much better in regard to her bilateral lower extremity ulcers. Only the right altar actually requires debridement today. The other two cleaned off nicely in two of the three in fact on the left lower extremity or actually almost completely healed. She does have some weeping noted still the bilateral lower extremities left greater than right. She still is not able to use her compression pumps even though I have mentioned this several times to her as did Leah last week. No fevers, chills, nausea, or vomiting noted at this time. Patient has been tolerating the dressing changes without complication. Patient has no evidence of dementia 05/19/17 on evaluation today patient appears to be doing very well regard to her bilateral lower extremity ulcers. In fact she seems to be improving and in fact healed in [name] couple of locations that were giving her trouble last  week. Overall I'm extremely pleased with how things have progressed. She still has some discomfort especially in the right lateral lower extremity. Fortunately there does not appear to be any evidence of significant infection which is good news she does tell me that she has used her compression pumps several times although not routinely over the past week I did praise her for this. I do think it will be beneficial and that might even be what's helping with her wounds as far as healing is concerned at least to [name] degree. No fevers, chills, nausea, or vomiting noted at this time. Patient has no dementia and no evidence of infection. 05/26/17 on evaluation today patient presents for evaluation concerning her bilateral lower extremity ulcer secondary to lymphedema. Fortunately she appears to be doing very well at this point in time. Specifically her right leg appears to be completely closed and healed. The left leg though there is still [name] small area of opening overall has healed. She does have [name] little bit of excoriation at the top or the wraps are because they slid down on her. Otherwise there's no evidence of infection and the other has improved dramatically now that she is not draining as significantly. Patient is having no significant pain she tells me she has been able to use her lymphedema pumps one time [name] day although that's not all she can tolerate. Even that is difficult for her. 06/02/17 on evaluation today patient's ulcers appear to be completely closed at this point. She does still note some odor but I believe this is due to the fact she has not been able to wash her legs as well currently. Fortunately she did bring her compression with her today. She does have the EXTREMIT-EASE Compression. With that being said she is somewhat nervous about going back to this as previously she broke out yet again once we discontinue wrapping her. READMISSION 11/23/17; patient  is now 75 year old diabetic woman with  severe chronic venous insufficiency with lymphedema. We care for her last in this clinic in February 2019 at which time she had chronic venous insufficiency with lymphedema bilateral lower extremity ulcers. We discharged her with bilateral wrap around/extremitease stockings and compression pumps. Is fairly clear she is not using the compression pumps stating it causes pain in her knees. In any case she was admitted to hospital from 8/3 through 8/6 with [name] several week history of recurrent bilateral ulcers. She was felt to have bilateral cellulitis treated with doxycycline IV. She is still on oral doxycycline and has another 2 days worth. At discharge her white count was 14.1 hemoglobin at 8.9. The patient is [name] diabetic however her last ABIs in 2018 showed [name] right ABI of 1.21, left of 1.35. TBI's were 0.76 on the right 0.75 on the left. Posterior tibial artery was monophasic on the right triphasic on the left. Dorsalis pedis triphasic on the right and triphasic on the left. She is not felt to have significant macrovascular disease 12/02/17;significant bilateral lower extremity wounds secondary to severe uncontrolled lymphedema. She did not get home health out to change the wraps. So she kept this on all week. She is telling me she using his her compression pumps once [name] week. In general all the wounds look better. We've been using silver alginate with secondary absorptive dressings 4 layer compression 12/10/17; bilateral lower extremity wounds secondary to uncontrolled lymphedema. She tells me she is using her compression pumps on most days. We've been keeping wraps on all week. Using silver alginate. She has wounds on the right lateral calf left medial and left lateral calf. All of this is somewhat better 12/16/17; the patient has 3 small open areas. One on the right lateral calf, one on the left medial calf and the small area on the left lateral calf. She has lymphedema, chronic venous insufficiency with  hemosiderin deposition. Very fibrotic distal lower extremity scan with suggestion of an inverted bottle sign appearance to her legs. She has compression pumps but she does not use them has not used them in the last week. Currently we have her in 4 layer compression, silver alginate to the wounds and this is being changed once by home health. She is noncompliant with the compression pumps because she says it hurts her knees. She also has compression stockings ando Juxta light stockings at home 12/24/17; the patient's right lateral calf is healed as well as the left medial. There is no open wound on the right leg. She still has 2 areas on the left lateral calf. We transitioned the right leg into an extremitease stocking which she brought in from home. 12/31/17; the patient has [name] superficial wound on the left lateral lower calf. We've been wrapping this leg. She arrives today with the right leg swollen but without [name] wound. She cannot get her extremities stocking on the right leg. There is [name] lot more edema here. She is not using her compression pumps 01/10/2018; patient has [name] same superficial wound on the left lateral calf. Her right leg has less peripheral edema. She tells me she also started herself on some Lasix that had been previously prescribed for her at some point but she had never taken it. She is also concerned about whether her compression pumps are leaking. She has extremitease stockings however the edema gets bad enough she can get these on. Currently she has no open wound on the right leg and is mention  the edema is actually better than when I saw this 10 days ago 01/17/2018; patient has [name] same superficial area on the posterior left calf and [name] weeping area in the mid part of the right lateral calf. She still does not have anyone from [facility] coming to see her compression pumps which she describes as being suspicious for [name] leakage. I am not really sure how frequently she is using this  in any case. She has massive bilateral calf lymphedema. I think there is some spreading lymphedema into her posterior thighs. 01/24/2018; the patient's edema in her legs is actually some better. She still has [name] smaller area on the left posterior calf although this is better. The weeping area on the right lateral calf is also better. As it turned out she did not get her pumps from [facility] but [facility] and they are going to try to go out and see these this week which is going to help. I explained to her that she will use the compression pumps once [name] day while we are wrapping her legs but when she transitions to stockings that may be she will require pumping twice [name] day. Her compliance in this area has not been high and I wonder about her ability to do this herself. 02/01/2018; the area on the posterior left leg is healed. She still has the original wound on the right lateral calf which is epithelialized. The however just above this there is still weeping lymphedema fluid. She has [name] new area on the upper right calf which is either wrap injury or is she points out where she has been scratching under the wraps. We still not have managed to get [name] hold of [name] representative of [facility] to see if the pump is operational. She has extremitease stockings at home however I am not of the opinion that this will maintain her skin integrity without the compression pumps. 02/08/2018; posterior left leg remains healed although she has [name] new small open area on the left lateral calf. Right lateral calf has two quarter sized open areas and close juxtaposition. There is less weeping edema fluid. She apparently managed to contact [facility]. They are sending her [name] part. But she is still not received it 02/15/2018; the patient has no open wound on either leg. Some scale present on the right upper lateral calf just below the fibular head. Edema control is excellent. She did not bring her  stockings or her juxta lite wrap around. She has the part for her compression pump but she has not use the compression pumps yet READMISSION 01/10/2019 Patient is now [name] 75 year old woman that we have had in the clinic multiple times in the past. She has chronic lower extremity lymphedema, recurrent wounds on her bilateral lower extremities. She is also [name] type II diabetic reasonably well controlled with [name] recent hemoglobin A1c while she was in the hospital of 5.9. She has wounds on her bilateral lower extremities mostly on the left lateral and right lateral. These were noted also during her recent hospitalization. The patient states they have been there for [name] while. She has not been able to wear her stockings he has trouble getting them on as she lives alone. She also has external compression pumps but uses them sparingly because it causes pain in her knees which hurt because of osteoarthritis. The patient was on doxycycline before she went into the hospital apparently for fear of infection in her legs and is on Keflex coming  out because of [name] UTI The patient was recently in the hospital from 01/06/2019 through 01/09/2019. She was admitted with nausea and vomiting and prerenal azotemia. This was corrected with IV fluid her Lasix has been put on hold. Hemoglobin A1c at 5.9 her metformin was discontinued. She was also felt to have [name] UTI she was prescribed Keflex at 503 times [name] day she is picking that up today. The patient is not felt to have an arterial issue. ABIs in our clinic were 1.24 on the right and 1.06 on the left. She had formal studies in 2018 at which time her ABIs were 1.21 on the right TBI of 0.76 and on the left 1.35 and 0.75 respectively. Waveforms are on the right were monophasic and biphasic, triphasic and biphasic on the left. She is tolerated 4 layer compression in the past 10/13. Comes in today having not used her compression pumps /perhaps once in the last week. She has too much edema  to consider healing these wounds. We have been using silver alginate 10/20; she tells me he had she used her compression pumps once [name] day as we asked. Clearly she has edema out of her legs although most of it seems to be settling around her knees which is what she complains about. We have been using silver alginate to the extensive de-epithelialized area on the left lateral calf and [name] much smaller area on the right lateral extending linearly into the right posterior 10/27; she is using her compression pumps daily. Her edema control is better. We have been using silver alginate to the extensive wound areas on her bilateral lower legs. This includes left anterior left lateral and left posterior. Right anterior right lateral and right posterior. The wounds are not all in the same condition. She has severe bilateral skin damage with the epithelialization, flaking dried epithelium 11/3; she is using her compression pumps 1 times daily. Pretty obvious that her wraps fell down especially on the left leg to mid calf. I think things are improving on the right lateral calf, I do not see anything open posteriorly. The major areas on the left lateral where she has 3 or 4 deeper wounds in the midst of an entire de-epithelialized area. There is erythema here but no tenderness I think this represents venous stasis rather than cellulitis. I debrided the areas on the left lateral calf last week but once again they have tightly adherent debris over the top of them which is disappointing 11/10; the patient's major areas on the left lateral calf to 3 wounds with some depth surrounded by [name] large area of de-epithelialized tissue. There is no infection. We have been using silver alginate under 4-layer compression. On the right lateral she has [name] more contained wound area that is superficial. Been using silver alginate under 4-layer compression 11/17; the patient's major wound is on the left lateral calf. We have been using  silver alginate under compression. There is no open wound per se on the right but it de-epithelialized area on the lateral calf that is weeping edema fluid. I do not see much change here today. 12/1; patient arrives in clinic with her wounds looking better. Her edema control is better. The lymphedema specialist working on the left leg we have been working on the right. We have been using 4-layer compression. The patient is using [name] consider external compression pumps and Hydrofera Blue to the open areas. She is being measured for an abdominal compression pump and apparently that is going  to go through her insurance 12/15; continued improvement. 2 small areas on the left and slightly larger 2 small areas on the right. 12/22; patient still has open areas on the right lateral calf which are superficial and the new one on the right anterior calf. She did not use her compression pumps this week. Her left leg is wrapped by our lymphedema specialist. There are no open wounds here 12/29; she still has [name] superficial area on the right lateral calf and area on the right medial calf. Right anterior wound area appears to have closed. She has much better edema control today than last week 04/11/2019; the patient is here with severe bilateral lymphedema, chronic venous insufficiency with marked skin changes in her lower extremities. We are wrapping her right leg and her lymphedema specialist is putting lymphedema wraps on the left. She states she is using her compression pumps although in the past her compliance with uses been limited because of osteoarthritic pain in her knees. She only has one remaining area on the right leg however she has wrap injuries on the left at the upper level what where I think her wraps were cause. the left lateral area left posterior. These would be new this week 1/12; generally better. The wrap injuries in the upper calf on the left from last week are down to [name] single wound. She has 2  wounds anteriorly that are still weeping fluid although these appear to have been epithelialized surface for the most part. There is no open wounds on the right leg we are wrapping her right leg and 4-layer compression and we are going [name] lymphedema wrap on the left leg. The the approach is [name] Farrow 2000 wrap on the left leg and then subsequently the right leg. She claims to be compliant with her compression pumps 1/19; the patient has 1 tiny open area on the right medial calf. The rest of this is not open. She however has [name] large erythematous denuded area on the left anterior tibia extending laterally. There is no evidence of infection here. She has her new current waist high compression pumps 1/26; the patient has no open wounds on the right. On the left anteriorly much better than last week still [name] smattering of small weeping open areas. There is no evidence of infection 2/2; still no open wounds on the right leg. He still does not have [name] external compression garment. She arrives in clinic today with an open area on the plantar aspect of the left second toe at the base of the PIP. This apparently was painful last week so it was not wrapped by our lymphedema specialist. There was no open wound at the time. The patient denies traumatizing the toe however today she arrives with [name] small but deep wound in this area the dorsal surface of the toe is very tender. 2/11; she arrives in clinic today with everything epithelialized. She was that way last week on the right but we have still been wrapping. She has been using lymphedema wraps on the left. On careful inspection of the left leg she still has areas that look vulnerable but no clearly open wound there may be some weeping through cracks in the skin. She is using above the waist external compression pumps and she has [name] separate external compression garment for her thighs. The plan is to discharge her for follow-up in lymphedema clinic with Juliann Pulse our  lymphedema specialist. In the past healing this woman has resulted in relatively quick breakdown  however she generally seems more motivated example willing to use her external compression pumps etc. READMISSION 02/13/2020. [name] [name] is [name] now 75 year old woman who has bilateral lower extremity lymphedema and [name] history of difficult to treat bilateral lower extremity wounds. When she was here last time we had her seen by [name] lymphedema specialist. She had lower extremity compression stockings as well as compression garments for her thighs. She has compression pumps as well. Unfortunately she has not been doing well. She describes unrelenting nausea with vomiting anytime she tries to eat or drink or take her medications. She was seen in urgent care on 10/26. Notably they found her to be somewhat dehydrated. Gave her [name] liter of fluid. Her creatinine was up to 2.42. The rest of her lab work including liver function tests, white count, hemoglobin were all normal. She did have [name] urine for culture that showed Klebsiella she was prescribed ciprofloxacin but I seriously doubt that she is able to keep this down by her description. She has not taken any of her meds in over [name] month. She states she feels too weak to take care of her lower extremities with compression garments and her external compression pumps she has not been using any of these. As [name] result she has 2 large areas on the left lateral calf one anteriorly [name] cluster of 3 on the right lateral and [name] small area on her right ankle. All of these are superficial. She has poorly controlled lymphedema especially on the anterior tibial areas however by my my anxiety she actually looks like she has less swelling than what I remember. Past medical history includes type 2 diabetes with [name] recent hemoglobin A1c of 6.8 chronic venous insufficiency, lymphedema which she was managing with above the waist compression and compression pumps, diastolic congestive heart  failure. Nausea and vomiting as described above. We did not repeat her arterial studies today however these were quite normal when they were last done in 2018 with an ABI of 1.21 on the right and 1.35 on the left. TBI's were within normal limits at 0.76 and 0.75 she had triphasic waveforms on the left monophasic waveforms at the posterior tibial but triphasic waveforms at the dorsalis pedis. She has previously tolerated 4 layer compression 11/18; the patient has [name] cluster of wounds on the right lateral lower calf and areas on the left lateral calf. We have been using silver alginate under compression she seems to be tolerating this well. Her edema is improved. HOWEVER she arrives in clinic today still with [name] description of bilious vomiting that is making it impossible for her to eat and drink. Her vitals are stable however she looks more gaunt. I have tried to talk her into going to the emergency room to be checked out however she will not hear anything of it 11/29; the patient's right leg is closed. On the left leg she has 2 small superficial open areas anteriorly and one just to the lateral part of the tibia. These are just about closed. She did go to the ER with regards to her persistent nausea and vomiting. Apparently they did not find anything and did [name] CT scan of her abdomen although I have not yet had [name] chance to review these records Readmission: 10/16/2020 upon evaluation today patient presents for reevaluation here in the clinic due to issues that she is can have one of the bilateral lower extremities. More importantly she has an issue which looks to be more of [name] friction injury  to be honest in the posterior upper thigh on the left. Again this appears to potentially be where [name] brief could have been pulled [name] little bit causing [name] friction injury. Again it does not look like [name] pressure injury at all very superficial not really draining much at all. Fortunately there does not appear to be any  signs of active infection at this time which is great news. No fevers, chills, nausea, vomiting, or diarrhea. Patient does have [name] history of bilateral lower extremity lymphedema, hypertension, congestive heart failure, and long-term use of anticoagulant therapy. She is currently on Eliquis. Unfortunately since we last seen her she apparently ended up apparently fallen in her home and she was subsequently found on the ground by her daughter admitted to the hospital for altered mental status and weakness she was found to have [name] UTI and Warnicke's encephalopathy. Upon discharge mental status had improved she had fevers which were thought to be potentially due to pseudogout flare. She did improve with steroids. She also had an EGD which showed reflux esophagitis and multiple large and small nonbleeding duodenal ulcers. She was [facility] on 04/08/2020 [name] she has been there since. It was her daughter who actually found her as she was not able to get in touch with her mom. For that reason she called the police for [name] safety check and found her lying on the floor covered with stool and bugs crawling all over her. She was seen by her PCP for intractable nausea and vomiting 4 to 6 weeks prior. She was found to have profound weakness which honestly has not really improved much at this point. Her CT scan was negative. Patient History Information obtained from Patient. Allergies oxycodone HCl (Severity: Mild, Reaction: nausea, vomiting), penicillin (Severity: Moderate, Reaction: itching), adhesive (Reaction: rash) Family History Cancer - Mother- lung, Diabetes - maternal uncle, Lung Disease - Father- COPD, No family history of Heart Disease, Hereditary Spherocytosis, Hypertension, Kidney Disease, Seizures, Stroke, Thyroid Problems, Tuberculosis. Social History Former smoker - 2ppd cigarette x 74yr - quit 11/23/1985, Marital Status - Single, Alcohol Use - Never, Drug Use - No History, Caffeine Use  - Moderate - coffee, tea, soda. Medical History Eyes Patient has history of Cataracts - both eyes Denies history of Glaucoma, Optic Neuritis Ear/Nose/Mouth/Throat Denies history of Chronic sinus problems/congestion, Middle ear problems Hematologic/Lymphatic Patient has history of Lymphedema - BLE Denies history of Anemia, Hemophilia, Human Immunodeficiency Virus, Sickle Cell Disease Respiratory Denies history of Aspiration, Asthma, Chronic Obstructive Pulmonary Disease (COPD), Pneumothorax, Sleep Apnea, Tuberculosis Cardiovascular Patient has history of Arrhythmia - PAC, PVC,SVT,MURMUR, T achycardia, Congestive Heart Failure, Hypertension, Peripheral Venous Disease - bilat Denies history of Angina, Coronary Artery Disease, Deep Vein Thrombosis, Hypotension, Myocardial Infarction, Peripheral Arterial Disease, Phlebitis, Vasculitis Gastrointestinal Denies history of Cirrhosis , Colitis, Crohnoos, Hepatitis [name], Hepatitis B, Hepatitis C Endocrine Patient has history of Type II Diabetes - does not check BG Denies history of Type I Diabetes Genitourinary Denies history of End Stage Renal Disease Immunological Denies history of Lupus Erythematosus, Raynaudoos, Scleroderma Integumentary (Skin) Denies history of History of Burn Musculoskeletal Patient has history of Osteoarthritis - bilat. knees, spine, back, fingers Denies history of Gout, Rheumatoid Arthritis, Osteomyelitis Neurologic Patient has history of Neuropathy Denies history of Dementia, Quadriplegia, Paraplegia, Seizure Disorder Oncologic Denies history of Received Chemotherapy, Received Radiation Psychiatric Denies history of Anorexia/bulimia, Confinement Anxiety Hospitalization/Surgery History - cesarean section. - US echocardiography EF 55-60%. - SVT ablation. - 04/08/2020 wernicke's encephalopathy, sepsis, UTI [facility] Medical [name] Surgical History Notes nd Constitutional Symptoms (General Health) morbid obesity ,  physical deconditioning "feeling weAK" Ear/Nose/Mouth/Throat allergic rhinitis Respiratory pneumonia as child Cardiovascular aortic stenosis, morbid obesity , hypokalemia , Gastrointestinal GERD , mixed incontinence Genitourinary acute kidney injury , mixed incontinence Musculoskeletal arthritis , multilevel spine pain Review of Systems (ROS) Constitutional Symptoms (General Health) Denies complaints or symptoms of Fatigue, Fever, Chills, Marked Weight Change. Genitourinary Denies complaints or symptoms of Frequent urination. Integumentary (Skin) Complains or has symptoms of Wounds - left upper thigh. Musculoskeletal Denies complaints or symptoms of Muscle Pain, Muscle Weakness. Neurologic Denies complaints or symptoms of Numbness/parasthesias. Psychiatric Denies complaints or symptoms of Claustrophobia, Suicidal. Objective Constitutional patient is hypertensive.. pulse regular and within target range for patient.Marland Kitchen respirations regular, non-labored and within target range for patient.Marland Kitchen temperature within target range for patient.. Well-nourished and well-hydrated in no acute distress. Vitals Time Taken: 2:55 PM, Temperature: 97.6 F, Pulse: 94 bpm, Respiratory Rate: 20 breaths/min, Blood Pressure: 162/93 mmHg. Eyes conjunctiva clear no eyelid edema noted. pupils equal round and reactive to light and accommodation. Ears, Nose, Mouth, and Throat no gross abnormality of ear auricles or external auditory canals. normal hearing noted during conversation. mucus membranes moist. Respiratory normal breathing without difficulty. Cardiovascular 2+ dorsalis pedis/posterior tibialis pulses. no clubbing, cyanosis, significant edema, Musculoskeletal Patient unable to walk. Psychiatric this patient is able to make decisions and demonstrates good insight into disease process. Alert and Oriented x 3. pleasant and cooperative. General Notes: Upon inspection patient's wound bed actually  showed signs of being decently well overall in regard to her legs she has been having Curlex and Coban wraps placed that are in place pretty much daily that were cut off before she came here for some reason. Nonetheless that something that we will take care of today as well. With that being said the patient also tells me that she has been frustrated with the fact that her mental status that she feels okay she feels like others trigger like there is not much that she really can understand. This seems to be very frustrating for her. She also does not feel very comfortable she tells me that there is an [place] gentleman that keeps coming into her room and this is causing no end of stress for her as well. Overall this is very unfortunate not feel extremely sorry for her honestly she is trying to do which she can she tells me to build up strength in her legs but she is having [name] very difficult time being able to do so. She is not able to stand or walk at all. Integumentary (Hair, Skin) Wound #47 status is Open. Original cause of wound was Gradually Appeared. The date acquired was: 10/16/2020. The wound is located on the Left,Posterior Upper Leg. The wound measures 1cm length x 2.4cm width x 0.1cm depth; 1.885cm^2 area and 0.188cm^3 volume. There is Fat Layer (Subcutaneous Tissue) exposed. There is no tunneling or undermining noted. There is [name] medium amount of serosanguineous drainage noted. The wound margin is distinct with the outline attached to the wound base. There is large (67-100%) red granulation within the wound bed. There is no necrotic tissue within the wound bed. Assessment Active Problems ICD-10 Lymphedema, not elsewhere classified Other specified soft tissue disorders Non-pressure chronic ulcer of left thigh with fat layer exposed Essential (primary) hypertension Chronic combined systolic (congestive) and diastolic (congestive) heart failure Long term (current) use of  anticoagulants Plan Follow-up Appointments: Return appointment in 3 weeks. - with  Margarita Grizzle **Room 5-Stretcher** Other: - Patient resides at [facility] Bathing/ Shower/ Hygiene: May shower and wash wound with soap and water. Edema Control - Lymphedema / SCD / Other: Elevate legs to the level of the heart or above for 30 minutes daily and/or when sitting, [name] frequency of: Moisturize legs daily. Other Edema Control Orders/Instructions: - Apply Kerlix/Coban 2 layer wrap to bilateral legs, change 2x week. Off-Loading: Other: - Try to minimize friction/shearing to area Additional Orders / Instructions: Follow Nutritious Diet WOUND #47: - Upper Leg Wound Laterality: Left, Posterior Cleanser: Soap and Water 3 x Per Week/30 Days Discharge Instructions: May shower and wash wound with dial antibacterial soap and water prior to dressing change. Cleanser: Wound Cleanser 3 x Per Week/30 Days Discharge Instructions: Cleanse the wound with wound cleanser prior to applying [name] clean dressing using gauze sponges, not tissue or cotton balls. Prim Dressing: Xeroform Occlusive Gauze Dressing, 4x4 in 3 x Per Week/30 Days ary Discharge Instructions: Apply to wound bed as instructed Secondary Dressing: Zetuvit Plus Silicone Border Dressing 4x4 (in/in) 3 x Per Week/30 Days Discharge Instructions: Or equivalent bordered foam 1. Upon evaluation today patient appears to be doing well currently with regard to her lower extremities there is no wounds here. 2. In regard to her left posterior thigh this is [name] small area of opening currently this seems to be more of [name] friction related injury likely due to [name] brief change that might of rubbed too much on this causing the open area. Slightly draining much I think Xeroform is not be ideal here. 3. With regard to the strength in her legs this seems to be very slow to return. She tells me she is trying to do what she can as far as rehab is concerned but is not really making  much progress here. 4. With regard to offloading she definitely needs to make sure not to just lay on her backside putting too much pressure she needs to move around and definitely make some attempt to change positions throughout the day. We will see patient back for reevaluation in 3 weeks here in the clinic. If anything worsens or changes patient will contact our office for additional recommendations. Electronic Signature(s) Signed: 10/16/2020 4:44:06 PM By: Worthy Keeler PA-C Entered By: Worthy Keeler on 10/16/2020 16:44:06 -------------------------------------------------------------------------------- HxROS Details Patient Name: Date of Service: [name] [name], [name] [name] 10/16/2020 2:45 PM Medical Record Number: [id number] Patient Account Number: [id number] Date of Birth/Sex: Treating RN: 04-06-1946 (75 y.o. [name] [name] Primary Care Provider: [name] Other Clinician: Referring Provider: Treating Provider/Extender: Dreama Saa in Treatment: 0 Information Obtained From Patient Constitutional Symptoms (General Health) Complaints and Symptoms: Negative for: Fatigue; Fever; Chills; Marked Weight Change Medical History: Past Medical History Notes: morbid obesity , physical deconditioning "feeling weAK" Genitourinary Complaints and Symptoms: Negative for: Frequent urination Medical History: Negative for: End Stage Renal Disease Past Medical History Notes: acute kidney injury , mixed incontinence Integumentary (Skin) Complaints and Symptoms: Positive for: Wounds - left upper thigh Medical History: Negative for: History of Burn Musculoskeletal Complaints and Symptoms: Negative for: Muscle Pain; Muscle Weakness Medical History: Positive for: Osteoarthritis - bilat. knees, spine, back, fingers Negative for: Gout; Rheumatoid Arthritis; Osteomyelitis Past Medical History Notes: arthritis , multilevel spine pain Neurologic Complaints  and Symptoms: Negative for: Numbness/parasthesias Medical History: Positive for: Neuropathy Negative for: Dementia; Quadriplegia; Paraplegia; Seizure Disorder Psychiatric Complaints and Symptoms: Negative for: Claustrophobia; Suicidal Medical History: Negative for: Anorexia/bulimia; Confinement Anxiety Eyes Medical  History: Positive for: Cataracts - both eyes Negative for: Glaucoma; Optic Neuritis Ear/Nose/Mouth/Throat Medical History: Negative for: Chronic sinus problems/congestion; Middle ear problems Past Medical History Notes: allergic rhinitis Hematologic/Lymphatic Medical History: Positive for: Lymphedema - BLE Negative for: Anemia; Hemophilia; Human Immunodeficiency Virus; Sickle Cell Disease Respiratory Medical History: Negative for: Aspiration; Asthma; Chronic Obstructive Pulmonary Disease (COPD); Pneumothorax; Sleep Apnea; Tuberculosis Past Medical History Notes: pneumonia as child Cardiovascular Medical History: Positive for: Arrhythmia - PAC, PVC,SVT ,MURMUR, T achycardia; Congestive Heart Failure; Hypertension; Peripheral Venous Disease - bilat Negative for: Angina; Coronary Artery Disease; Deep Vein Thrombosis; Hypotension; Myocardial Infarction; Peripheral Arterial Disease; Phlebitis; Vasculitis Past Medical History Notes: aortic stenosis, morbid obesity , hypokalemia , Gastrointestinal Medical History: Negative for: Cirrhosis ; Colitis; Crohns; Hepatitis [name]; Hepatitis B; Hepatitis C Past Medical History Notes: GERD , mixed incontinence Endocrine Medical History: Positive for: Type II Diabetes - does not check BG Negative for: Type I Diabetes Time with diabetes: 3 years Treated with: Oral agents Blood sugar tested every day: No Immunological Medical History: Negative for: Lupus Erythematosus; Raynauds; Scleroderma Oncologic Medical History: Negative for: Received Chemotherapy; Received Radiation HBO Extended History  Items Eyes: Cataracts Immunizations Pneumococcal Vaccine: Received Pneumococcal Vaccination: No Tetanus Vaccine: Last tetanus shot: 04/06/2010 Implantable Devices None Hospitalization / Surgery History Type of Hospitalization/Surgery cesarean section US echocardiography EF 55-60% SVT ablation 04/08/2020 wernicke's encephalopathy, sepsis, UTI [place] Family and Social History Cancer: Yes - Mother- lung; Diabetes: Yes - maternal uncle; Heart Disease: No; Hereditary Spherocytosis: No; Hypertension: No; Kidney Disease: No; Lung Disease: Yes - Father- COPD; Seizures: No; Stroke: No; Thyroid Problems: No; Tuberculosis: No; Former smoker - 2ppd cigarette x 69yr - quit 11/23/1985; Marital Status - Single; Alcohol Use: Never; Drug Use: No History; Caffeine Use: Moderate - coffee, tea, soda; Financial Concerns: Yes - medical; Food, Clothing or Shelter Needs: No; Support System Lacking: No; Transportation Concerns: No Electronic Signature(s) Signed: 10/16/2020 5:19:59 PM By: Worthy Keeler PA-C Signed: 10/16/2020 6:29:13 PM By: Deon Pilling Entered By: Deon Pilling on 10/16/2020 15:38:19 -------------------------------------------------------------------------------- SuperBill Details Patient Name: Date of Service: [name] [name], [name] [name] 10/16/2020 Medical Record Number: [id number] Patient Account Number: [id number] Date of Birth/Sex: Treating RN: [birth year] (75 y.o. Sue Lush Primary Care Provider: [name] Other Clinician: Referring Provider: Treating Provider/Extender: Dreama Saa in Treatment: 0 Diagnosis Coding ICD-10 Codes Code Description I89.0 Lymphedema, not elsewhere classified M79.89 Other specified soft tissue disorders L97.122 Non-pressure chronic ulcer of left thigh with fat layer exposed I10 Essential (primary) hypertension I50.42 Chronic combined systolic (congestive) and diastolic (congestive) heart failure Z79.01 Long  term (current) use of anticoagulants Facility Procedures CPT4 Code: [id number] Description: 99213 - WOUND CARE VISIT-LEV 3 EST PT Modifier: 25 Quantity: 1 Physician Procedures : CPT4 Code Description Modifier [id number] - WC PHYS LEVEL 4 - EST PT ICD-10 Diagnosis Description I89.0 Lymphedema, not elsewhere classified M79.89 Other specified soft tissue disorders L97.122 Non-pressure chronic ulcer of left thigh with fat layer  exposed I10 Essential (primary) hypertension Quantity: 1 Electronic Signature(s) Signed: 10/16/2020 4:44:21 PM By: Worthy Keeler PA-C Entered By: Worthy Keeler on 10/16/2020 16:44:20

## 2020-10-16 NOTE — Progress Notes (Addendum)
[name] [name] ([id number]) Visit Report for 10/16/2020 Allergy List Details Patient Name: Date of Service: [name] [name], [name] [name] 10/16/2020 2:45 PM Medical Record Number: [id number] Patient Account Number: [id number] Date of Birth/Sex: Treating RN: 1946-02-11 (75 y.o. Debby Bud Primary Care Jaquae Rieves: [name] Other Clinician: Referring Rashay Barnette: Treating Nova Schmuhl/Extender: Dreama Saa in Treatment: 0 Allergies Active Allergies oxycodone HCl Reaction: nausea, vomiting Severity: Mild Active: 11/24/2010 penicillin Reaction: itching Severity: Moderate Active: 11/24/2010 adhesive Reaction: rash Allergy Notes Electronic Signature(s) Signed: 10/16/2020 6:29:13 PM By: Deon Pilling Entered By: Deon Pilling on 10/16/2020 15:32:33 -------------------------------------------------------------------------------- Arrival Information Details Patient Name: Date of Service: [name] [name], [name] [name] 10/16/2020 2:45 PM Medical Record Number: [id number] Patient Account Number: [id number] Date of Birth/Sex: Treating RN: April 02, 1946 (75 y.o. [name] [name], Tammi Klippel Primary Care Tenika Keeran: [name] Other Clinician: Referring Sabree Nuon: Treating Eliab Closson/Extender: Dreama Saa in Treatment: 0 Visit Information Patient Arrived: Stretcher Arrival Time: 14:55 Accompanied By: self Transfer Assistance: Stretcher Patient Identification Verified: Yes Secondary Verification Process Completed: Yes Patient Requires Transmission-Based Precautions: No Patient Has Alerts: Yes Patient Alerts: Patient on Blood Thinner History Since Last Visit Added or deleted any medications: Yes Any new allergies or adverse reactions: No Had [name] fall or experienced change in activities of daily living that may affect risk of falls: Yes Signs or symptoms of abuse/neglect since last visito No Hospitalized since last visit: Yes Implantable device outside  of the clinic excluding cellular tissue based products placed in the center since last visit: No Electronic Signature(s) Signed: 10/16/2020 6:29:13 PM By: Deon Pilling Entered By: Deon Pilling on 10/16/2020 15:30:58 -------------------------------------------------------------------------------- Clinic Level of Care Assessment Details Patient Name: Date of Service: [name] [name], [name] [name] 10/16/2020 2:45 PM Medical Record Number: [id number] Patient Account Number: [id number] Date of Birth/Sex: Treating RN: 04-Jan-1946 (75 y.o. Sue Lush Primary Care Donte Kary: [name] Other Clinician: Referring Antawan Mchugh: Treating Gordon Vandunk/Extender: Dreama Saa in Treatment: 0 Clinic Level of Care Assessment Items TOOL 2 Quantity Score X- 1 0 Use when only an EandM is performed on the INITIAL visit ASSESSMENTS - Nursing Assessment / Reassessment X- 1 20 General Physical Exam (combine w/ comprehensive assessment (listed just below) when performed on new pt. evals) X- 1 25 Comprehensive Assessment (HX, ROS, Risk Assessments, Wounds Hx, etc.) ASSESSMENTS - Wound and Skin [name] ssessment / Reassessment X - Simple Wound Assessment / Reassessment - one wound 1 5 []  - 0 Complex Wound Assessment / Reassessment - multiple wounds []  - 0 Dermatologic / Skin Assessment (not related to wound area) ASSESSMENTS - Ostomy and/or Continence Assessment and Care []  - 0 Incontinence Assessment and Management []  - 0 Ostomy Care Assessment and Management (repouching, etc.) PROCESS - Coordination of Care []  - 0 Simple Patient / Family Education for ongoing care X- 1 20 Complex (extensive) Patient / Family Education for ongoing care []  - 0 Staff obtains Programmer, systems, Records, T Results / Process Orders est X- 1 10 Staff telephones HHA, Nursing Homes / Clarify orders / etc []  - 0 Routine Transfer to another Facility (non-emergent condition) []  - 0 Routine Hospital Admission  (non-emergent condition) []  - 0 New Admissions / Biomedical engineer / Ordering NPWT Apligraf, etc. , []  - 0 Emergency Hospital Admission (emergent condition) []  - 0 Simple Discharge Coordination []  - 0 Complex (extensive) Discharge Coordination PROCESS - Special Needs []  - 0 Pediatric / Minor Patient Management []  - 0 Isolation Patient Management []  - 0 Hearing / Language /  Visual special needs []  - 0 Assessment of Community assistance (transportation, D/C planning, etc.) []  - 0 Additional assistance / Altered mentation []  - 0 Support Surface(s) Assessment (bed, cushion, seat, etc.) INTERVENTIONS - Wound Cleansing / Measurement X- 1 5 Wound Imaging (photographs - any number of wounds) []  - 0 Wound Tracing (instead of photographs) X- 1 5 Simple Wound Measurement - one wound []  - 0 Complex Wound Measurement - multiple wounds X- 1 5 Simple Wound Cleansing - one wound []  - 0 Complex Wound Cleansing - multiple wounds INTERVENTIONS - Wound Dressings []  - 0 Small Wound Dressing one or multiple wounds []  - 0 Medium Wound Dressing one or multiple wounds X- 2 20 Large Wound Dressing one or multiple wounds []  - 0 Application of Medications - injection INTERVENTIONS - Miscellaneous []  - 0 External ear exam []  - 0 Specimen Collection (cultures, biopsies, blood, body fluids, etc.) []  - 0 Specimen(s) / Culture(s) sent or taken to Lab for analysis []  - 0 Patient Transfer (multiple staff / Civil Service fast streamer / Similar devices) []  - 0 Simple Staple / Suture removal (25 or less) []  - 0 Complex Staple / Suture removal (26 or more) []  - 0 Hypo / Hyperglycemic Management (close monitor of Blood Glucose) []  - 0 Ankle / Brachial Index (ABI) - do not check if billed separately Has the patient been seen at the hospital within the last three years: Yes Total Score: 135 Level Of Care: New/Established - Level 4 Electronic Signature(s) Signed: 10/16/2020 6:05:01 PM By: Lorrin Jackson Entered By: Lorrin Jackson on 10/16/2020 16:22:48 -------------------------------------------------------------------------------- Encounter Discharge Information Details Patient Name: Date of Service: [name] [name], [name] [name] 10/16/2020 2:45 PM Medical Record Number: [id number] Patient Account Number: [id number] Date of Birth/Sex: Treating RN: 01-11-1946 (75 y.o. Debby Bud Primary Care Kitti Mcclish: [name] Other Clinician: Referring Martisha Toulouse: Treating Nakai Yard/Extender: Dreama Saa in Treatment: 0 Encounter Discharge Information Items Discharge Condition: Stable Ambulatory Status: Stretcher Discharge Destination: Home Transportation: Private Auto Accompanied By: self Schedule Follow-up Appointment: Yes Clinical Summary of Care: Electronic Signature(s) Signed: 10/16/2020 6:29:13 PM By: Deon Pilling Entered By: Deon Pilling on 10/16/2020 17:18:30 -------------------------------------------------------------------------------- Lower Extremity Assessment Details Patient Name: Date of Service: [name] [name], [name] [name] 10/16/2020 2:45 PM Medical Record Number: [id number] Patient Account Number: [id number] Date of Birth/Sex: Treating RN: 1945/04/08 (76 y.o. Debby Bud Primary Care Lamel Mccarley: [name] Other Clinician: Referring Tandi Hanko: Treating Gad Aymond/Extender: Dreama Saa in Treatment: 0 Edema Assessment Assessed: Shirlyn Goltz: Yes] Patrice Paradise: Yes] Edema: [Left: Yes] [Right: Yes] Calf Left: Right: Point of Measurement: 38 cm From Medial Instep 44 cm 46 cm Ankle Left: Right: Point of Measurement: 11 cm From Medial Instep 38 cm 30 cm Knee To Floor Left: Right: From Medial Instep 44 cm Vascular Assessment Pulses: Dorsalis Pedis Palpable: [Left:Yes] [Right:Yes] Electronic Signature(s) Signed: 10/16/2020 6:29:13 PM By: Deon Pilling Entered By: Deon Pilling on 10/16/2020  15:43:53 -------------------------------------------------------------------------------- Multi-Disciplinary Care Plan Details Patient Name: Date of Service: [name] [name], [name] [name] 10/16/2020 2:45 PM Medical Record Number: [id number] Patient Account Number: [id number] Date of Birth/Sex: Treating RN: 06-17-1945 (75 y.o. Sue Lush Primary Care Oziah Vitanza: [name] Other Clinician: Referring Mari Battaglia: Treating Levita Monical/Extender: Dreama Saa in Treatment: 0 Active Inactive Wound/Skin Impairment Nursing Diagnoses: Impaired tissue integrity Goals: Patient/caregiver will verbalize understanding of skin care regimen Date Initiated: 10/16/2020 Target Resolution Date: 11/20/2020 Goal Status: Active Ulcer/skin breakdown will have [name] volume reduction of 30% by week 4 Date  Initiated: 10/16/2020 Target Resolution Date: 11/20/2020 Goal Status: Active Interventions: Assess patient/caregiver ability to obtain necessary supplies Assess patient/caregiver ability to perform ulcer/skin care regimen upon admission and as needed Assess ulceration(s) every visit Provide education on ulcer and skin care Treatment Activities: Skin care regimen initiated : 10/16/2020 Topical wound management initiated : 10/16/2020 Notes: Electronic Signature(s) Signed: 10/16/2020 6:05:01 PM By: Lorrin Jackson Entered By: Lorrin Jackson on 10/16/2020 16:20:58 -------------------------------------------------------------------------------- Pain Assessment Details Patient Name: Date of Service: [name] [name], [name] [name] 10/16/2020 2:45 PM Medical Record Number: [id number] Patient Account Number: [id number] Date of Birth/Sex: Treating RN: [birth year] (75 y.o. Debby Bud Primary Care Roshan Salamon: [name] Other Clinician: Referring Jiyah Torpey: Treating Mayerly Kaman/Extender: Dreama Saa in Treatment: 0 Active Problems Location of Pain Severity and  Description of Pain Patient Has Paino Yes Site Locations Pain Location: Generalized Pain Rate the pain. Worst Pain Level: 10 Least Pain Level: 0 Tolerable Pain Level: 8 Pain Management and Medication Current Pain Management: Medication: No Cold Application: No Rest: No Massage: No Activity: No T.E.N.S.: No Heat Application: No Leg drop or elevation: No Is the Current Pain Management Adequate: Adequate How does your wound impact your activities of daily livingo Sleep: No Bathing: No Appetite: No Relationship With Others: No Bladder Continence: No Emotions: No Bowel Continence: No Work: No Toileting: No Drive: No Dressing: No Hobbies: No Electronic Signature(s) Signed: 10/16/2020 6:29:13 PM By: Deon Pilling Entered By: Deon Pilling on 10/16/2020 15:42:15 -------------------------------------------------------------------------------- Patient/Caregiver Education Details Patient Name: Date of Service: Lanette Hampshire 7/13/2022andnbsp2:45 PM Medical Record Number: [id number] Patient Account Number: [id number] Date of Birth/Gender: Treating RN: January 27, 1946 (75 y.o. Sue Lush Primary Care Physician: [name] Other Clinician: Referring Physician: Treating Physician/Extender: Dreama Saa in Treatment: 0 Education Assessment Education Provided To: Patient and Caregiver SNF Education Topics Provided Venous: Methods: Explain/Verbal, Printed Responses: State content correctly Wound/Skin Impairment: Methods: Demonstration, Explain/Verbal, Printed Responses: State content correctly Electronic Signature(s) Signed: 10/16/2020 6:05:01 PM By: Lorrin Jackson Entered By: Lorrin Jackson on 10/16/2020 16:21:26 -------------------------------------------------------------------------------- Wound Assessment Details Patient Name: Date of Service: [name] [name], [name] [name] 10/16/2020 2:45 PM Medical Record Number: [id number] Patient  Account Number: [id number] Date of Birth/Sex: Treating RN: [birth year] (75 y.o. [name] [name], Meta.Reding Primary Care Daija Routson: [name] Other Clinician: Referring Yuette Putnam: Treating Fraser Busche/Extender: Dreama Saa in Treatment: 0 Wound Status Wound Number: 47 Primary Abrasion Etiology: Wound Location: Left, Posterior Upper Leg Wound Open Wounding Event: Gradually Appeared Status: Date Acquired: 10/16/2020 Comorbid Cataracts, Lymphedema, Arrhythmia, Congestive Heart Failure, Weeks Of Treatment: 0 History: Hypertension, Peripheral Venous Disease, Type II Diabetes, Clustered Wound: No Osteoarthritis, Neuropathy Photos Wound Measurements Length: (cm) 1 Width: (cm) 2.4 Depth: (cm) 0.1 Area: (cm) 1.885 Volume: (cm) 0.188 % Reduction in Area: 0% % Reduction in Volume: 0% Epithelialization: Small (1-33%) Tunneling: No Undermining: No Wound Description Classification: Full Thickness Without Exposed Support Structures Wound Margin: Distinct, outline attached Exudate Amount: Medium Exudate Type: Serosanguineous Exudate Color: red, brown Foul Odor After Cleansing: No Slough/Fibrino No Wound Bed Granulation Amount: Large (67-100%) Exposed Structure Granulation Quality: Red Fascia Exposed: No Necrotic Amount: None Present (0%) Fat Layer (Subcutaneous Tissue) Exposed: Yes Tendon Exposed: No Muscle Exposed: No Joint Exposed: No Bone Exposed: No Treatment Notes Wound #47 (Upper Leg) Wound Laterality: Left, Posterior Cleanser Soap and Water Discharge Instruction: May shower and wash wound with dial antibacterial soap and water prior to dressing change. Wound Cleanser Discharge Instruction: Cleanse the wound with wound cleanser  prior to applying [name] clean dressing using gauze sponges, not tissue or cotton balls. Peri-Wound Care Topical Primary Dressing Xeroform Occlusive Gauze Dressing, 4x4 in Discharge Instruction: Apply to wound bed as  instructed Secondary Dressing Zetuvit Plus Silicone Border Dressing 4x4 (in/in) Discharge Instruction: Or equivalent bordered foam Secured With Compression Wrap Compression Stockings Add-Ons Notes BLE kerlix and coban. Electronic Signature(s) Signed: 10/17/2020 6:14:19 PM By: Deon Pilling Signed: 10/21/2020 3:49:49 PM By: Sandre Kitty Previous Signature: 10/16/2020 6:29:13 PM Version By: Deon Pilling Entered By: Sandre Kitty on 10/17/2020 09:22:53 -------------------------------------------------------------------------------- Vitals Details Patient Name: Date of Service: [name] [name], [name] [name] 10/16/2020 2:45 PM Medical Record Number: [id number] Patient Account Number: [id number] Date of Birth/Sex: Treating RN: Dec 17, 1945 (75 y.o. Debby Bud Primary Care Ege Muckey: [name] Other Clinician: Referring Samiah Ricklefs: Treating Meesha Sek/Extender: Dreama Saa in Treatment: 0 Vital Signs Time Taken: 14:55 Temperature (F): 97.6 Pulse (bpm): 94 Respiratory Rate (breaths/min): 20 Blood Pressure (mmHg): 162/93 Reference Range: 80 - 120 mg / dl Electronic Signature(s) Signed: 10/16/2020 6:29:13 PM By: Deon Pilling Entered By: Deon Pilling on 10/16/2020 15:32:18

## 2020-10-16 NOTE — Progress Notes (Signed)
[name] [name] ([id number]) Visit Report for 10/16/2020 Abuse/Suicide Risk Screen Details Patient Name: Date of Service: [name] [name], [name] [name] 10/16/2020 2:45 PM Medical Record Number: [id number] Patient Account Number: [id number] Date of Birth/Sex: Treating RN: [birth year] (75 y.o. Helene Shoe, Tammi Klippel Primary Care Shannia Jacuinde: [name] Other Clinician: Referring Aava Deland: Treating Shirleyann Montero/Extender: Dreama Saa in Treatment: 0 Abuse/Suicide Risk Screen Items Answer ABUSE RISK SCREEN: Has anyone close to you tried to hurt or harm you recentlyo No Do you feel uncomfortable with anyone in your familyo No Has anyone forced you do things that you didnt want to doo No Electronic Signature(s) Signed: 10/16/2020 6:29:13 PM By: Deon Pilling Entered By: Deon Pilling on 10/16/2020 15:39:17 -------------------------------------------------------------------------------- Activities of Daily Living Details Patient Name: Date of Service: [name] [name] 10/16/2020 2:45 PM Medical Record Number: [id number] Patient Account Number: [id number] Date of Birth/Sex: Treating RN: [birth year] (74 y.o. Helene Shoe, Tammi Klippel Primary Care Janece Laidlaw: [name] Other Clinician: Referring Sara Selvidge: Treating Juliannah Ohmann/Extender: Dreama Saa in Treatment: 0 Activities of Daily Living Items Answer Activities of Daily Living (Please select one for each item) Drive Automobile Not Able T Medications ake Completely Able Use T elephone Completely Able Care for Appearance Completely Able Use T oilet Need Assistance Bath / Shower Need Assistance Dress Self Need Assistance Feed Self Need Assistance Walk Not Able Get In / Out Bed Need Assistance Housework Not Able Prepare Meals Not [place] Not Able Shop for Self Not Able Electronic Signature(s) Signed: 10/16/2020 6:29:13 PM By: Deon Pilling Entered By: Deon Pilling on 10/16/2020  15:39:43 -------------------------------------------------------------------------------- Education Screening Details Patient Name: Date of Service: [name] [name] 10/16/2020 2:45 PM Medical Record Number: [id number] Patient Account Number: [id number] Date of Birth/Sex: Treating RN: October 12, 1945 (75 y.o. Helene Shoe, Tammi Klippel Primary Care Vira Chaplin: [name] Other Clinician: Referring Deadrian Toya: Treating Mirella Gueye/Extender: Dreama Saa in Treatment: 0 Primary Learner Assessed: Patient Learning Preferences/Education Level/Primary Language Learning Preference: Explanation, Demonstration, Printed Material Highest Education Level: College or Above Preferred Language: English Cognitive Barrier Language Barrier: No Translator Needed: No Memory Deficit: No Emotional Barrier: No Cultural/Religious Beliefs Affecting Medical Care: No Physical Barrier Impaired Vision: Yes Glasses Impaired Hearing: No Decreased Hand dexterity: No Knowledge/Comprehension Knowledge Level: High Comprehension Level: High Ability to understand written instructions: High Ability to understand verbal instructions: High Motivation Anxiety Level: Calm Cooperation: Cooperative Education Importance: Acknowledges Need Interest in Health Problems: Asks Questions Perception: Coherent Willingness to Engage in Self-Management High Activities: Readiness to Engage in Self-Management High Activities: Electronic Signature(s) Signed: 10/16/2020 6:29:13 PM By: Deon Pilling Entered By: Deon Pilling on 10/16/2020 15:40:16 -------------------------------------------------------------------------------- Fall Risk Assessment Details Patient Name: Date of Service: [name] [name] 10/16/2020 2:45 PM Medical Record Number: [id number] Patient Account Number: [id number] Date of Birth/Sex: Treating RN: May 11, 1945 (75 y.o. Helene Shoe, Tammi Klippel Primary Care Kanishk Stroebel: [name] Other  Clinician: Referring Jacen Carlini: Treating Tomeeka Plaugher/Extender: Dreama Saa in Treatment: 0 Fall Risk Assessment Items Have you had 2 or more falls in the last 12 monthso 0 Yes Have you had any fall that resulted in injury in the last 12 monthso 0 Yes FALLS RISK SCREEN History of falling - immediate or within 3 months 0 No Secondary diagnosis (Do you have 2 or more medical diagnoseso) 0 No Ambulatory aid None/bed rest/wheelchair/nurse 0 Yes Crutches/cane/walker 0 No Furniture 0 No Intravenous therapy Access/Saline/Heparin Lock 0 No Gait/Transferring Normal/ bed rest/ wheelchair 0 Yes Weak (short steps with or  without shuffle, stooped but able to lift head while walking, may seek 0 No support from furniture) Impaired (short steps with shuffle, may have difficulty arising from chair, head down, impaired 0 No balance) Mental Status Oriented to own ability 0 Yes Electronic Signature(s) Signed: 10/16/2020 6:29:13 PM By: Deon Pilling Entered By: Deon Pilling on 10/16/2020 15:40:51 -------------------------------------------------------------------------------- Nutrition Risk Screening Details Patient Name: Date of Service: [name] [name] 10/16/2020 2:45 PM Medical Record Number: [id number] Patient Account Number: [id number] Date of Birth/Sex: Treating RN: Jul 30, 1945 (75 y.o. Debby Bud Primary Care Saket Hellstrom: [name] Other Clinician: Referring Sereniti Wan: Treating Chania Kochanski/Extender: Dreama Saa in Treatment: 0 Height (in): Weight (lbs): Body Mass Index (BMI): Nutrition Risk Screening Items Score Screening NUTRITION RISK SCREEN: I have an illness or condition that made me change the kind and/or amount of food I eat 2 Yes I eat fewer than two meals per day 0 No I eat few fruits and vegetables, or milk products 0 No I have three or more drinks of beer, liquor or wine almost every day 0 No I have tooth  or mouth problems that make it hard for me to eat 0 No I don't always have enough money to buy the food I need 0 No I eat alone most of the time 0 No I take three or more different prescribed or over-the-counter drugs a day 1 Yes Without wanting to, I have lost or gained 10 pounds in the last six months 0 No I am not always physically able to shop, cook and/or feed myself 2 Yes Nutrition Protocols Good Risk Protocol Provide education on elevated blood Moderate Risk Protocol 0 sugars and impact on wound healing, as applicable High Risk Proctocol Risk Level: Moderate Risk Score: 5 Electronic Signature(s) Signed: 10/16/2020 6:29:13 PM By: Deon Pilling Entered By: Deon Pilling on 10/16/2020 15:41:01

## 2020-10-17 ENCOUNTER — Telehealth: Payer: Self-pay | Admitting: Internal Medicine

## 2020-10-17 NOTE — Telephone Encounter (Signed)
   Patient calling to request FL2 for [place]    Please call

## 2020-10-17 NOTE — Telephone Encounter (Signed)
FL2 has been placed in [place] office for her signature.

## 2020-10-17 NOTE — Telephone Encounter (Signed)
Fine for FL2

## 2020-10-17 NOTE — Telephone Encounter (Signed)
Ok to fill out FL2? Please advise

## 2020-10-29 ENCOUNTER — Ambulatory Visit: Payer: PPO | Admitting: Internal Medicine

## 2020-11-05 DIAGNOSIS — L603 Nail dystrophy: Secondary | ICD-10-CM | POA: Diagnosis not present

## 2020-11-05 DIAGNOSIS — E1159 Type 2 diabetes mellitus with other circulatory complications: Secondary | ICD-10-CM | POA: Diagnosis not present

## 2020-11-05 DIAGNOSIS — B351 Tinea unguium: Secondary | ICD-10-CM | POA: Diagnosis not present

## 2020-11-05 DIAGNOSIS — R601 Generalized edema: Secondary | ICD-10-CM | POA: Diagnosis not present

## 2020-11-06 ENCOUNTER — Encounter (HOSPITAL_BASED_OUTPATIENT_CLINIC_OR_DEPARTMENT_OTHER): Payer: PPO | Admitting: Physician Assistant

## 2020-11-06 DIAGNOSIS — E119 Type 2 diabetes mellitus without complications: Secondary | ICD-10-CM | POA: Diagnosis not present

## 2020-11-06 DIAGNOSIS — L89153 Pressure ulcer of sacral region, stage 3: Secondary | ICD-10-CM | POA: Diagnosis not present

## 2020-11-06 DIAGNOSIS — E440 Moderate protein-calorie malnutrition: Secondary | ICD-10-CM | POA: Diagnosis not present

## 2020-11-08 ENCOUNTER — Other Ambulatory Visit: Payer: Self-pay

## 2020-11-08 ENCOUNTER — Encounter: Payer: Self-pay | Admitting: Internal Medicine

## 2020-11-08 ENCOUNTER — Ambulatory Visit (INDEPENDENT_AMBULATORY_CARE_PROVIDER_SITE_OTHER): Payer: PPO | Admitting: Internal Medicine

## 2020-11-08 VITALS — BP 126/86 | HR 97 | Temp 98.6°F | Resp 18

## 2020-11-08 DIAGNOSIS — N3946 Mixed incontinence: Secondary | ICD-10-CM

## 2020-11-08 DIAGNOSIS — N183 Chronic kidney disease, stage 3 unspecified: Secondary | ICD-10-CM | POA: Diagnosis not present

## 2020-11-08 DIAGNOSIS — N179 Acute kidney failure, unspecified: Secondary | ICD-10-CM

## 2020-11-08 DIAGNOSIS — I890 Lymphedema, not elsewhere classified: Secondary | ICD-10-CM | POA: Diagnosis not present

## 2020-11-08 DIAGNOSIS — E1169 Type 2 diabetes mellitus with other specified complication: Secondary | ICD-10-CM | POA: Diagnosis not present

## 2020-11-08 DIAGNOSIS — M549 Dorsalgia, unspecified: Secondary | ICD-10-CM

## 2020-11-08 LAB — VITAMIN D 25 HYDROXY (VIT D DEFICIENCY, FRACTURES): VITD: 35.26 ng/mL (ref 30.00–100.00)

## 2020-11-08 LAB — COMPREHENSIVE METABOLIC PANEL
ALT: 13 U/L (ref 0–35)
AST: 17 U/L (ref 0–37)
Albumin: 3.6 g/dL (ref 3.5–5.2)
Alkaline Phosphatase: 80 U/L (ref 39–117)
BUN: 15 mg/dL (ref 6–23)
CO2: 29 mEq/L (ref 19–32)
Calcium: 9.9 mg/dL (ref 8.4–10.5)
Chloride: 106 mEq/L (ref 96–112)
Creatinine, Ser: 0.93 mg/dL (ref 0.40–1.20)
GFR: 60.73 mL/min (ref >60.00)
Glucose, Bld: 88 mg/dL (ref 70–99)
Potassium: 3.7 mEq/L (ref 3.5–5.1)
Sodium: 143 mEq/L (ref 135–145)
Total Bilirubin: 0.3 mg/dL (ref 0.2–1.2)
Total Protein: 7.9 g/dL (ref 6.0–8.3)

## 2020-11-08 LAB — CBC
HCT: 31 % — ABNORMAL LOW (ref 36.0–46.0)
Hemoglobin: 9.9 g/dL — ABNORMAL LOW (ref 12.0–15.0)
MCHC: 32 g/dL (ref 30.0–36.0)
MCV: 74.6 fl — ABNORMAL LOW (ref 78.0–100.0)
Platelets: 400 10*3/uL (ref 150.0–400.0)
RBC: 4.15 Mil/uL (ref 3.87–5.11)
RDW: 19.5 % — ABNORMAL HIGH (ref 11.5–15.5)
WBC: 5.3 10*3/uL (ref 4.0–10.5)

## 2020-11-08 LAB — VITAMIN B12: Vitamin B-12: 485 pg/mL (ref 211–911)

## 2020-11-08 LAB — HEMOGLOBIN A1C: Hgb A1c MFr Bld: 6.3 % (ref 4.6–6.5)

## 2020-11-08 NOTE — Patient Instructions (Signed)
We will get you in touch with social worker to see about longer term options.   We will do the clearance form for the neck surgery in case you want to do this.   We are checking the labs today.

## 2020-11-08 NOTE — Assessment & Plan Note (Signed)
Checking HgA1c. She is eating normally for some time now.

## 2020-11-08 NOTE — Assessment & Plan Note (Signed)
Checking CMP, BP at goal, checking HgA1c for sugar control.

## 2020-11-08 NOTE — Assessment & Plan Note (Signed)
Overall stable and she is unable to ambulate due to this and needs placement. Referral to chronic care management for social worker.

## 2020-11-08 NOTE — Progress Notes (Signed)
   Subjective:   Patient ID: [name] [name], female    DOB: 1946/03/25, 75 y.o.   MRN: [id number]  HPI The patient is a 75 YO female coming in for follow up and potentially clearance for neck surgery. She has been at nursing home since hospital admission Dec 2021 and she is not pleased with her location. She is sitting in feces and urine for hours a day she is not sure if the staffing is low. She does not feel it is appropriate. Would like to get back home but unable to walk or get our of bed by herself. Needs help getting to the restroom. She is having a lot of neck pain and is considering surgery to help with that. Eating and drinking well. No current N/V.   Review of Systems  Constitutional: Negative.   HENT: Negative.    Eyes: Negative.   Respiratory:  Negative for cough, chest tightness and shortness of breath.   Cardiovascular:  Positive for leg swelling. Negative for chest pain and palpitations.  Gastrointestinal:  Negative for abdominal distention, abdominal pain, constipation, diarrhea, nausea and vomiting.  Musculoskeletal: Negative.   Skin: Negative.   Neurological:  Positive for weakness.  Psychiatric/Behavioral: Negative.     Objective:  Physical Exam Constitutional:      Appearance: She is well-developed.  HENT:     Head: Normocephalic and atraumatic.  Cardiovascular:     Rate and Rhythm: Normal rate and regular rhythm.  Pulmonary:     Effort: Pulmonary effort is normal. No respiratory distress.     Breath sounds: Normal breath sounds. No wheezing or rales.  Abdominal:     General: Bowel sounds are normal. There is no distension.     Palpations: Abdomen is soft.     Tenderness: There is no abdominal tenderness. There is no rebound.  Musculoskeletal:     Cervical back: Normal range of motion.     Comments: Chronic lymphedema bilateral stable from prior  Skin:    General: Skin is warm and dry.  Neurological:     Mental Status: She is alert and oriented to person,  place, and time.     Coordination: Coordination abnormal.     Comments: wheelchair    Vitals:   11/08/20 0912  BP: 126/86  Pulse: 97  Resp: 18  Temp: 98.6 F (37 C)  TempSrc: Oral  SpO2: 98%    This visit occurred during the SARS-CoV-2 public health emergency.  Safety protocols were in place, including screening questions prior to the visit, additional usage of staff PPE, and extensive cleaning of exam room while observing appropriate contact time as indicated for disinfecting solutions.   Assessment & Plan:

## 2020-11-08 NOTE — Assessment & Plan Note (Addendum)
Assuming labs stable, she would be adequate surgical candidate and appropriate to proceed without further testing. I did speak with her that she would need rehab facility after surgery and she understood.

## 2020-11-08 NOTE — Assessment & Plan Note (Signed)
Her current facility is unable to provide the care she needs at this time. Referral to social work.

## 2020-11-08 NOTE — Assessment & Plan Note (Signed)
Checking CMP today for stability in consideration of pre-op clearance.

## 2020-11-11 ENCOUNTER — Telehealth: Payer: Self-pay | Admitting: *Deleted

## 2020-11-11 NOTE — Chronic Care Management (AMB) (Signed)
  Chronic Care Management   Note  11/11/2020 Name: [name] [name] MRN: [id number] DOB: Apr 27, 1945  [name] [name] is a 75 y.o. year old female who is a primary care patient of Hoyt Koch, MD. I reached out to Brenda by phone today in response to a referral sent by [name] [name]'s PCP Hoyt Koch, MD     [name] [name] was given information about Chronic Care Management services today including:  CCM service includes personalized support from designated clinical staff supervised by her physician, including individualized plan of care and coordination with other care providers 24/7 contact phone numbers for assistance for urgent and routine care needs. Service will only be billed when office clinical staff spend 20 minutes or more in a month to coordinate care. Only one practitioner may furnish and bill the service in a calendar month. The patient may stop CCM services at any time (effective at the end of the month) by phone call to the office staff. The patient will be responsible for cost sharing (co-pay) of up to 20% of the service fee (after annual deductible is met).  Patient agreed to services and verbal consent obtained.   Follow up plan: Telephone appointment with care management team member scheduled for: 11/26/2020  Julian Hy, [facility]  Direct Dial: [phone]

## 2020-11-12 DIAGNOSIS — H1045 Other chronic allergic conjunctivitis: Secondary | ICD-10-CM | POA: Diagnosis not present

## 2020-11-12 DIAGNOSIS — E119 Type 2 diabetes mellitus without complications: Secondary | ICD-10-CM | POA: Diagnosis not present

## 2020-11-12 DIAGNOSIS — H04123 Dry eye syndrome of bilateral lacrimal glands: Secondary | ICD-10-CM | POA: Diagnosis not present

## 2020-11-12 DIAGNOSIS — H25813 Combined forms of age-related cataract, bilateral: Secondary | ICD-10-CM | POA: Diagnosis not present

## 2020-11-12 DIAGNOSIS — H524 Presbyopia: Secondary | ICD-10-CM | POA: Diagnosis not present

## 2020-11-26 ENCOUNTER — Ambulatory Visit (INDEPENDENT_AMBULATORY_CARE_PROVIDER_SITE_OTHER): Payer: PPO | Admitting: *Deleted

## 2020-11-26 DIAGNOSIS — E119 Type 2 diabetes mellitus without complications: Secondary | ICD-10-CM

## 2020-11-26 DIAGNOSIS — M1990 Unspecified osteoarthritis, unspecified site: Secondary | ICD-10-CM | POA: Diagnosis not present

## 2020-11-26 DIAGNOSIS — R1013 Epigastric pain: Secondary | ICD-10-CM | POA: Diagnosis not present

## 2020-11-26 DIAGNOSIS — N183 Chronic kidney disease, stage 3 unspecified: Secondary | ICD-10-CM | POA: Diagnosis not present

## 2020-11-26 DIAGNOSIS — R5381 Other malaise: Secondary | ICD-10-CM

## 2020-11-26 DIAGNOSIS — I350 Nonrheumatic aortic (valve) stenosis: Secondary | ICD-10-CM | POA: Diagnosis not present

## 2020-11-26 DIAGNOSIS — R531 Weakness: Secondary | ICD-10-CM

## 2020-11-26 DIAGNOSIS — H9193 Unspecified hearing loss, bilateral: Secondary | ICD-10-CM | POA: Diagnosis not present

## 2020-11-26 NOTE — Chronic Care Management (AMB) (Signed)
Chronic Care Management    Clinical Social Work Note  11/26/2020 Name: [name] [name] MRN: [id number] DOB: [birth year]  [name] [name] is a 75 y.o. year old female who is a primary care patient of [name] Real Cons, MD. The CCM team was consulted to assist the patient with chronic disease management and/or care coordination needs related to: [employer]  and Level of Care Concerns.   Engaged with patient by telephone for initial visit in response to provider referral for social work chronic care management and care coordination services.   Consent to Services:  The patient was given information about Chronic Care Management services, agreed to services, and gave verbal consent prior to initiation of services.  Please see initial visit note for detailed documentation.   Patient agreed to services and consent obtained.   Assessment: Review of patient past medical history, allergies, medications, and health status, including review of relevant consultants reports was performed today as part of a comprehensive evaluation and provision of chronic care management and care coordination services.     SDOH (Social Determinants of Health) assessments and interventions performed:    Advanced Directives Status: Not addressed in this encounter.  CCM Care Plan  Allergies  Allergen Reactions   Oxycodone Hcl Nausea And Vomiting   Penicillins Itching    Has patient had a PCN reaction causing immediate rash, facial/tongue/throat swelling, SOB or lightheadedness with hypotension: No Has patient had a PCN reaction causing severe rash involving mucus membranes or skin necrosis: No Has patient had a PCN reaction that required hospitalization: No Has patient had a PCN reaction occurring within the last 10 years: No If all of the above answers are "NO", then may proceed with Cephalosporin use.    Outpatient Encounter Medications as of 11/26/2020  Medication Sig Note   Albuterol Sulfate  (PROAIR RESPICLICK) 332 (90 Base) MCG/ACT AEPB Inhale into the lungs. 2 puffs every 6 hours as needed    apixaban (ELIQUIS) 2.5 MG TABS tablet Take by mouth 2 (two) times daily.    collagenase (SANTYL) ointment Apply topically daily.    dextromethorphan-guaiFENesin (MUCINEX DM) 30-600 MG 12hr tablet Take 1 tablet by mouth 2 (two) times daily.    diclofenac Sodium (VOLTAREN) 1 % GEL Apply topically 4 (four) times daily.    feeding supplement, ENSURE COMPLETE, (ENSURE COMPLETE) LIQD Take 237 mLs by mouth 2 (two) times daily between meals.    folic acid (FOLVITE) 1 MG tablet Take 1 mg by mouth daily.    metFORMIN (GLUCOPHAGE) 500 MG tablet Take 1 tablet (500 mg total) by mouth daily with breakfast. 02/23/2020: LF 11/14   metoCLOPramide (REGLAN) 5 MG tablet Take 1 tablet (5 mg total) by mouth 3 (three) times daily as needed for nausea. (Patient not taking: No sig reported)    METOPROLOL TARTRATE PO Take 25 mg by mouth daily.    montelukast (SINGULAIR) 10 MG tablet Take 1 tablet (10 mg total) by mouth at bedtime. 02/23/2020: LF 10/18, 90 day supply    Multiple Vitamin (MULTIVITAMIN WITH MINERALS) TABS tablet Take 1 tablet by mouth daily.    nutrition supplement, JUVEN, (JUVEN) PACK Take 1 packet by mouth 2 (two) times daily between meals.    ondansetron (ZOFRAN ODT) 4 MG disintegrating tablet Take 1 tablet (4 mg total) by mouth every 8 (eight) hours as needed for nausea or vomiting.    pantoprazole (PROTONIX) 40 MG tablet TAKE 1 TABLET BY MOUTH EVERY DAY (Patient not taking: No sig reported)    promethazine (  PHENERGAN) 25 MG suppository Place 1 suppository (25 mg total) rectally every 6 (six) hours as needed for nausea or vomiting.    sucralfate (CARAFATE) 1 GM/10ML suspension Take 10 mLs (1 g total) by mouth 4 (four) times daily -  with meals and at bedtime.    thiamine 100 MG tablet Take 1 tablet (100 mg total) by mouth daily.    No facility-administered encounter medications on file as of 11/26/2020.     Patient Active Problem List   Diagnosis Date Noted   Polyarthralgia    Wernicke's aphasia    Bilateral leg ulcer, limited to breakdown of skin ([place]) 01/16/2020   Allergic rhinitis 07/28/2019   Postnasal drip 11/16/2017   Sensorineural hearing loss (SNHL), bilateral 11/16/2017   Tinnitus of both ears 11/16/2017   Obesity, Class II, BMI 35-39.9, isolated (see actual BMI) 11/08/2017   CKD (chronic kidney disease), stage III ([place]) 11/08/2017   Anemia 11/07/2017   Thrombocytosis 11/07/2017   Lymphedema 11/07/2017   AKI (acute kidney injury) ([place]) 10/16/2017   Routine general medical examination at a health care facility 05/10/2014   Paroxysmal SVT (supraventricular tachycardia) ([place]) 11/28/2013   Physical deconditioning 11/23/2013   Generalized weakness 11/17/2013   Multilevel spine pain 11/17/2013   Aortic stenosis, mild 11/17/2013   DM type 2 (diabetes mellitus, type 2) ([place]) 02/02/2013   Arthritis 10/19/2012   Mixed incontinence 10/19/2012   GERD (gastroesophageal reflux disease) 04/20/2011   HTN (hypertension) 01/20/2011   Morbid obesity ([place])     Conditions to be addressed/monitored:  level of care concerns/needs ; Level of care concerns  Care Plan : LCSW Plan of Care  Updates made by Deirdre Peer, LCSW since 11/26/2020 12:00 AM     Problem: Emotional Distress related to being "stuck at facility and want to get out"   Priority: High     Long-Range Goal: Emotional Health Supported   Start Date: 11/26/2020  Expected End Date: 01/03/2021  This Visit's Progress: On track  Priority: High  Note:   Current barriers:   Unable to independently navigate with SNF staff for her goal to return home Currently unable to  independently self manage needs related to chronic health conditions.  Knowledge Deficits related to short term plan for care coordination needs and long term plans for chronic disease management needs Clinical Goals: explore community resource options for  unmet needs related to:Inability to perform ADL's independently and Facility placement (wanting to leave SNF and return home) and Financial constraints  patient will follow up with collecting and completing Medicaid application, request meeting with SNF staff to discuss her wishes/goals as directed by SW Interventions:  CSW spoke with pt who advised she has been at [facility] for 8 months. She is not happy; wants to return home. CSW inquired about her level of ability to return home- pt reports she needs more therapy but SNF is not offering it. She reports her insurance ran out and she is now being billed for her stay as well as working to complete a Medicaid application for determination. Pt wants to return home and states the SNF staff are ignoring her and not pursuing her wishes.  CSW offered to call SNF to seek additional information but she declined.  CSW encouraged pt to request a meeting with the staff at SNF and her family to discuss her wishes/goals of returning home. Pt does not want her family coming; as they "may show out". CSW encouraged pt to request they meet with her and  if this is not met to consider calling the [employer]mb[employer]smen; which she states she has already done and has their #.  It is unclear if pt would be safe, independent enough to be alone, etc. CSW has advised pt if she gets Medicaid approved it does not mean she will have around the clock care- at best; a few hours 5 days/week through the [facility] program.   Assessment of needs, barriers , as well as how impacting  Review various resources and discussed options  ( Request meeting with the SNF staff, call Ombudsmen if remains dis-satisfied, hire help at home, ) Collaboration with PCP regarding development and update of comprehensive plan of care as evidenced by provider attestation and co-signature Inter-disciplinary care team collaboration (see longitudinal plan of care) Patient interviewed and appropriate assessments performed Provided patient  with information about Medicaid, private care at home, Ombudsmen support Discussed plans with patient for ongoing care management follow up and provided patient with direct contact information for care management team Advised patient to - speak to staff at [facility] and request a discharge planning meeting Provided education to patient/caregiver regarding level of care options. Other interventions provided: Solution-Focused Strategies, Active listening / Reflection utilized , Emotional Supportive Provided, Problem Solving /Task Center , Increase in actives / exercise encouraged , and Verbalization of feelings encouraged  Patient self-care activities: Over the next 7 days - develop a personal safety plan - exercise at least 2 to 3 times per week - spend time or talk with others at least 2 to 3 times per week - start or continue a personal journal - talk about feelings with a friend, family or spiritual advisor - practice positive thinking and self-talk - speak to staff at [facility] and request a discharge planning meeting -contact the Omb[employer]smen if needed as discussed -collect and complete Medicaid application - start or continue a personal journal - talk about feelings with a friend, family or spiritual advisor - practice positive thinking and self-talk   Follow Up Plan: CSW to call pt next week SW will follow up with patient by phone over the next 7 days        Follow Up Plan: SW will follow up with patient by phone over the next 7 days      Eduard Clos MSW, [place] Licensed Clinical Social Worker [place] [phone]

## 2020-11-26 NOTE — Patient Instructions (Signed)
Visit Information   PATIENT GOALS:   Goals Addressed               This Visit's Progress     Provide support and guidance related to pt's desire to be home (pt-stated)        Timeframe:  Long-Range Goal Priority:  High Start Date:       11/26/20                      Expected End Date:   02/02/21                    Follow Up Date 12/06/20  - speak to staff at [facility] and request a discharge planning meeting -contact the Ombudsmen if needed as discussed -collect and complete Medicaid application - start or continue a personal journal - talk about feelings with a friend, family or spiritual advisor - practice positive thinking and self-talk    Why is this important?   When you are stressed, down or upset, your body reacts too.  For example, your blood pressure may get higher; you may have a headache or stomachache.  When your emotions get the best of you, your body's ability to fight off cold and flu gets weak.  These steps will help you manage your emotions.     Notes:         Consent to CCM Services: Ms. Ogan was given information about Chronic Care Management services including:  CCM service includes personalized support from designated clinical staff supervised by her physician, including individualized plan of care and coordination with other care providers 24/7 contact phone numbers for assistance for urgent and routine care needs. Service will only be billed when office clinical staff spend 20 minutes or more in a month to coordinate care. Only one practitioner may furnish and bill the service in a calendar month. The patient may stop CCM services at any time (effective at the end of the month) by phone call to the office staff. The patient will be responsible for cost sharing (co-pay) of up to 20% of the service fee (after annual deductible is met).  Patient agreed to services and verbal consent obtained.   The patient verbalized understanding of instructions,  educational materials, and care plan provided today and declined offer to receive copy of patient instructions, educational materials, and care plan.   Telephone follow up appointment with care management team member scheduled for: next week  Eduard Clos MSW, LCSW Licensed Clinical Social Worker [place] [phone]   CLINICAL CARE PLAN: Patient Care Plan: LCSW Plan of Care     Problem Identified: Emotional Distress related to being "stuck at facility and want to get out"   Priority: High     Long-Range Goal: Emotional Health Supported   Start Date: 11/26/2020  Expected End Date: 01/03/2021  This Visit's Progress: On track  Priority: High  Note:   Current barriers:   Unable to independently navigate with SNF staff for her goal to return home Currently unable to  independently self manage needs related to chronic health conditions.  Knowledge Deficits related to short term plan for care coordination needs and long term plans for chronic disease management needs Clinical Goals: explore community resource options for unmet needs related to:Inability to perform ADL's independently and Facility placement (wanting to leave SNF and return home) and Financial constraints  patient will follow up with collecting and completing Medicaid application, request meeting with  SNF staff to discuss her wishes/goals as directed by SW Interventions:  CSW spoke with pt who advised she has been at [facility] for 8 months. She is not happy; wants to return home. CSW inquired about her level of ability to return home- pt reports she needs more therapy but SNF is not offering it. She reports her insurance ran out and she is now being billed for her stay as well as working to complete a Medicaid application for determination. Pt wants to return home and states the SNF staff are ignoring her and not pursuing her wishes.  CSW offered to call SNF to seek additional information but she declined.  CSW encouraged  pt to request a meeting with the staff at SNF and her family to discuss her wishes/goals of returning home. Pt does not want her family coming; as they "may show out". CSW encouraged pt to request they meet with her and if this is not met to consider calling the [employer]mbudsmen; which she states she has already done and has their #.  It is unclear if pt would be safe, independent enough to be alone, etc. CSW has advised pt if she gets Medicaid approved it does not mean she will have around the clock care- at best; a few hours 5 days/week through the [facility] program.   Assessment of needs, barriers , as well as how impacting  Review various resources and discussed options  ( Request meeting with the SNF staff, call Ombudsmen if remains dis-satisfied, hire help at home, ) Collaboration with PCP regarding development and update of comprehensive plan of care as evidenced by provider attestation and co-signature Inter-disciplinary care team collaboration (see longitudinal plan of care) Patient interviewed and appropriate assessments performed Provided patient with information about Medicaid, private care at home, Ombudsmen support Discussed plans with patient for ongoing care management follow up and provided patient with direct contact information for care management team Advised patient to - speak to staff at [facility] and request a discharge planning meeting Provided education to patient/caregiver regarding level of care options. Other interventions provided: Solution-Focused Strategies, Active listening / Reflection utilized , Emotional Supportive Provided, Problem Solving /Task Center , Increase in actives / exercise encouraged , and Verbalization of feelings encouraged  Patient self-care activities: Over the next 7 days - develop a personal safety plan - exercise at least 2 to 3 times per week - spend time or talk with others at least 2 to 3 times per week - start or continue a personal journal - talk about  feelings with a friend, family or spiritual advisor - practice positive thinking and self-talk - speak to staff at [facility] and request a discharge planning meeting -contact the Ombudsmen if needed as discussed -collect and complete Medicaid application - start or continue a personal journal - talk about feelings with a friend, family or spiritual advisor - practice positive thinking and self-talk   Follow Up Plan: CSW to call pt next week SW will follow up with patient by phone over the next 7 days

## 2020-11-27 ENCOUNTER — Encounter (HOSPITAL_BASED_OUTPATIENT_CLINIC_OR_DEPARTMENT_OTHER): Payer: PPO | Admitting: Internal Medicine

## 2020-11-28 DIAGNOSIS — R197 Diarrhea, unspecified: Secondary | ICD-10-CM | POA: Diagnosis not present

## 2020-12-03 ENCOUNTER — Telehealth: Payer: Self-pay | Admitting: Internal Medicine

## 2020-12-03 ENCOUNTER — Ambulatory Visit: Payer: PPO | Admitting: *Deleted

## 2020-12-03 DIAGNOSIS — N183 Chronic kidney disease, stage 3 unspecified: Secondary | ICD-10-CM

## 2020-12-03 DIAGNOSIS — R5381 Other malaise: Secondary | ICD-10-CM

## 2020-12-03 DIAGNOSIS — E119 Type 2 diabetes mellitus without complications: Secondary | ICD-10-CM | POA: Diagnosis not present

## 2020-12-03 DIAGNOSIS — R531 Weakness: Secondary | ICD-10-CM

## 2020-12-03 NOTE — Patient Instructions (Signed)
Visit Information  PATIENT GOALS:  Goals Addressed   None     The patient verbalized understanding of instructions, educational materials, and care plan provided today and declined offer to receive copy of patient instructions, educational materials, and care plan.   Telephone follow up appointment with care management team member scheduled for: next week  Eduard Clos MSW, LCSW Licensed Clinical Social Worker [place] [phone]

## 2020-12-03 NOTE — Chronic Care Management (AMB) (Signed)
Chronic Care Management    Clinical Social Work Note  12/03/2020 Name: [name] [name] MRN: [id number] DOB: [birth year]  [name] [name] is a 75 y.o. year old female who is a primary care patient of Hoyt Koch, MD. The CCM team was consulted to assist the patient with chronic disease management and/or care coordination needs related to: Level of Care Concerns.   Engaged with patient by telephone for follow up visit in response to provider referral for social work chronic care management and care coordination services.   Consent to Services:  The patient was given information about Chronic Care Management services, agreed to services, and gave verbal consent prior to initiation of services.  Please see initial visit note for detailed documentation.   Patient agreed to services and consent obtained.   Assessment: Review of patient past medical history, allergies, medications, and health status, including review of relevant consultants reports was performed today as part of a comprehensive evaluation and provision of chronic care management and care coordination services.     SDOH (Social Determinants of Health) assessments and interventions performed:    Advanced Directives Status: Not addressed in this encounter.  CCM Care Plan  Allergies  Allergen Reactions   Oxycodone Hcl Nausea And Vomiting   Penicillins Itching    Has patient had a PCN reaction causing immediate rash, facial/tongue/throat swelling, SOB or lightheadedness with hypotension: No Has patient had a PCN reaction causing severe rash involving mucus membranes or skin necrosis: No Has patient had a PCN reaction that required hospitalization: No Has patient had a PCN reaction occurring within the last 10 years: No If all of the above answers are "NO", then may proceed with Cephalosporin use.    Outpatient Encounter Medications as of 12/03/2020  Medication Sig Note   Albuterol Sulfate (PROAIR RESPICLICK) 741 (90  Base) MCG/ACT AEPB Inhale into the lungs. 2 puffs every 6 hours as needed    apixaban (ELIQUIS) 2.5 MG TABS tablet Take by mouth 2 (two) times daily.    collagenase (SANTYL) ointment Apply topically daily.    dextromethorphan-guaiFENesin (MUCINEX DM) 30-600 MG 12hr tablet Take 1 tablet by mouth 2 (two) times daily.    diclofenac Sodium (VOLTAREN) 1 % GEL Apply topically 4 (four) times daily.    feeding supplement, ENSURE COMPLETE, (ENSURE COMPLETE) LIQD Take 237 mLs by mouth 2 (two) times daily between meals.    folic acid (FOLVITE) 1 MG tablet Take 1 mg by mouth daily.    metFORMIN (GLUCOPHAGE) 500 MG tablet Take 1 tablet (500 mg total) by mouth daily with breakfast. 02/23/2020: LF 11/14   metoCLOPramide (REGLAN) 5 MG tablet Take 1 tablet (5 mg total) by mouth 3 (three) times daily as needed for nausea. (Patient not taking: No sig reported)    METOPROLOL TARTRATE PO Take 25 mg by mouth daily.    montelukast (SINGULAIR) 10 MG tablet Take 1 tablet (10 mg total) by mouth at bedtime. 02/23/2020: LF 10/18, 90 day supply    Multiple Vitamin (MULTIVITAMIN WITH MINERALS) TABS tablet Take 1 tablet by mouth daily.    nutrition supplement, JUVEN, (JUVEN) PACK Take 1 packet by mouth 2 (two) times daily between meals.    ondansetron (ZOFRAN ODT) 4 MG disintegrating tablet Take 1 tablet (4 mg total) by mouth every 8 (eight) hours as needed for nausea or vomiting.    pantoprazole (PROTONIX) 40 MG tablet TAKE 1 TABLET BY MOUTH EVERY DAY (Patient not taking: No sig reported)    promethazine (PHENERGAN) 25 MG  suppository Place 1 suppository (25 mg total) rectally every 6 (six) hours as needed for nausea or vomiting.    sucralfate (CARAFATE) 1 GM/10ML suspension Take 10 mLs (1 g total) by mouth 4 (four) times daily -  with meals and at bedtime.    thiamine 100 MG tablet Take 1 tablet (100 mg total) by mouth daily.    No facility-administered encounter medications on file as of 12/03/2020.    Patient Active  Problem List   Diagnosis Date Noted   Polyarthralgia    Wernicke's aphasia    Bilateral leg ulcer, limited to breakdown of skin ([place]) 01/16/2020   Allergic rhinitis 07/28/2019   Postnasal drip 11/16/2017   Sensorineural hearing loss (SNHL), bilateral 11/16/2017   Tinnitus of both ears 11/16/2017   Obesity, Class II, BMI 35-39.9, isolated (see actual BMI) 11/08/2017   CKD (chronic kidney disease), stage III ([place]) 11/08/2017   Anemia 11/07/2017   Thrombocytosis 11/07/2017   Lymphedema 11/07/2017   AKI (acute kidney injury) ([place]) 10/16/2017   Routine general medical examination at a health care facility 05/10/2014   Paroxysmal SVT (supraventricular tachycardia) ([place]) 11/28/2013   Physical deconditioning 11/23/2013   Generalized weakness 11/17/2013   Multilevel spine pain 11/17/2013   Aortic stenosis, mild 11/17/2013   DM type 2 (diabetes mellitus, type 2) ([place]) 02/02/2013   Arthritis 10/19/2012   Mixed incontinence 10/19/2012   GERD (gastroesophageal reflux disease) 04/20/2011   HTN (hypertension) 01/20/2011   Morbid obesity ([place])     Conditions to be addressed/monitored: Anxiety and current SNF placement ; Level of care concerns and Mental Health Concerns   Care Plan : LCSW Plan of Care  Updates made by Deirdre Peer, LCSW since 12/03/2020 12:00 AM     Problem: Emotional Distress related to being "stuck at facility and want to get out"   Priority: High     Long-Range Goal: Emotional Health Supported   Start Date: 11/26/2020  Expected End Date: 01/03/2021  This Visit's Progress: Not on track  Recent Progress: On track  Priority: High  Note:   Current barriers:   Unable to independently navigate with SNF staff for her goal to return home Currently unable to  independently self manage needs related to chronic health conditions.  Knowledge Deficits related to short term plan for care coordination needs and long term plans for chronic disease management needs Clinical  Goals: explore community resource options for unmet needs related to:Inability to perform ADL's independently and Facility placement (wanting to leave SNF and return home) and Financial constraints  patient will follow up with collecting and completing Medicaid application, request meeting with SNF staff to discuss her wishes/goals as directed by SW Interventions:  Follow up call to pt today- states she remains at the SNF and has not made contact with SNF staff to discuss her wishes, concerns and plans.  "I have tried but have not heard back".  CSW suggested to pt that she ask any staff that come into her room at the SNF to request the Social Worker/Discharge Planner come into the room to talk with her. If she continues to not have success with this; she is reminded she can call the Ombudsmen to file a complaint.  Pt has # for [employer].   CSW spoke with pt who advised she has been at [facility] for 8 months. She is not happy; wants to return home. CSW inquired about her level of ability to return home- pt reports she needs more therapy but SNF is  not offering it. She reports her insurance ran out and she is now being billed for her stay as well as working to complete a Medicaid application for determination. Pt wants to return home and states the SNF staff are ignoring her and not pursuing her wishes.  CSW offered to call SNF to seek additional information but she declined.  CSW encouraged pt to request a meeting with the staff at SNF and her family to discuss her wishes/goals of returning home. Pt does not want her family coming; as they "may show out". CSW encouraged pt to request they meet with her and if this is not met to consider calling the Ombudsmen; which she states she has already done and has their #.  It is unclear if pt would be safe, independent enough to be alone, etc. CSW has advised pt if she gets Medicaid approved it does not mean she will have around the clock care- at best; a few hours 5  days/week through the [facility] program.   Assessment of needs, barriers , as well as how impacting  Review various resources and discussed options  ( Request meeting with the SNF staff, call Ombudsmen if remains dis-satisfied, hire help at home, ) Collaboration with PCP regarding development and update of comprehensive plan of care as evidenced by provider attestation and co-signature Inter-disciplinary care team collaboration (see longitudinal plan of care) Patient interviewed and appropriate assessments performed Provided patient with information about Medicaid, private care at home, Ombudsmen support Discussed plans with patient for ongoing care management follow up and provided patient with direct contact information for care management team Advised patient to - speak to staff at [facility] and request a discharge planning meeting Provided education to patient/caregiver regarding level of care options. Other interventions provided: Solution-Focused Strategies, Active listening / Reflection utilized , Emotional Supportive Provided, Problem Solving /Task Center , Increase in actives / exercise encouraged , and Verbalization of feelings encouraged  Patient self-care activities: Over the next 7 days - develop a personal safety plan - exercise at least 2 to 3 times per week - spend time or talk with others at least 2 to 3 times per week - start or continue a personal journal - talk about feelings with a friend, family or spiritual advisor - practice positive thinking and self-talk - speak to staff at [facility] and request a discharge planning meeting -contact the Ombudsmen if needed as discussed -collect and complete Medicaid application - start or continue a personal journal - talk about feelings with a friend, family or spiritual advisor - practice positive thinking and self-talk   Follow Up Plan: CSW to call pt next week SW will follow up with patient by phone over the next 7 days         Follow Up Plan: SW will follow up with patient by phone over the next 7 days      Eduard Clos MSW, [place] Licensed Clinical Social Worker [place] [phone]

## 2020-12-03 NOTE — Telephone Encounter (Signed)
Patient wants something for pain and stiffness in back, legs, neck  [id number] [place] [facility] [address]:  [phone] Fax:  [phone]   She says she is not able to move

## 2020-12-04 ENCOUNTER — Encounter (HOSPITAL_BASED_OUTPATIENT_CLINIC_OR_DEPARTMENT_OTHER): Payer: PPO | Attending: Internal Medicine | Admitting: Internal Medicine

## 2020-12-04 ENCOUNTER — Other Ambulatory Visit: Payer: Self-pay

## 2020-12-04 DIAGNOSIS — I872 Venous insufficiency (chronic) (peripheral): Secondary | ICD-10-CM | POA: Diagnosis not present

## 2020-12-04 DIAGNOSIS — I890 Lymphedema, not elsewhere classified: Secondary | ICD-10-CM | POA: Insufficient documentation

## 2020-12-04 DIAGNOSIS — L97122 Non-pressure chronic ulcer of left thigh with fat layer exposed: Secondary | ICD-10-CM | POA: Insufficient documentation

## 2020-12-04 DIAGNOSIS — E1140 Type 2 diabetes mellitus with diabetic neuropathy, unspecified: Secondary | ICD-10-CM | POA: Insufficient documentation

## 2020-12-04 DIAGNOSIS — E11622 Type 2 diabetes mellitus with other skin ulcer: Secondary | ICD-10-CM | POA: Insufficient documentation

## 2020-12-04 DIAGNOSIS — I5042 Chronic combined systolic (congestive) and diastolic (congestive) heart failure: Secondary | ICD-10-CM | POA: Diagnosis not present

## 2020-12-04 DIAGNOSIS — Z7901 Long term (current) use of anticoagulants: Secondary | ICD-10-CM | POA: Diagnosis not present

## 2020-12-04 DIAGNOSIS — I110 Hypertensive heart disease with heart failure: Secondary | ICD-10-CM | POA: Insufficient documentation

## 2020-12-04 DIAGNOSIS — L97112 Non-pressure chronic ulcer of right thigh with fat layer exposed: Secondary | ICD-10-CM | POA: Diagnosis not present

## 2020-12-05 DIAGNOSIS — R419 Unspecified symptoms and signs involving cognitive functions and awareness: Secondary | ICD-10-CM | POA: Diagnosis not present

## 2020-12-05 DIAGNOSIS — F331 Major depressive disorder, recurrent, moderate: Secondary | ICD-10-CM | POA: Diagnosis not present

## 2020-12-05 NOTE — Progress Notes (Signed)
ORQUIDIA, FRYDRYCH ([id number]) Visit Report for 12/04/2020 Arrival Information Details Patient Name: Date of Service: [name] [name], [name] [name] 12/04/2020 9:00 A M Medical Record Number: [id number] Patient Account Number: [id number] Date of Birth/Sex: Treating RN: 10/05/1945 (75 y.o. Tonita Phoenix, Lauren Primary Care Helmuth Recupero: [name] Other Clinician: Referring Mar Zettler: Treating Kiylee Thoreson/Extender: [name] in Treatment: 7 Visit Information History Since Last Visit Added or deleted any medications: No Patient Arrived: Wheel Chair Any new allergies or adverse reactions: No Arrival Time: 09:29 Had a fall or experienced change in No Accompanied By: self activities of daily living that may affect Transfer Assistance: Harrel Lemon Lift risk of falls: Patient Identification Verified: Yes Signs or symptoms of abuse/neglect since last visito No Secondary Verification Process Completed: Yes Hospitalized since last visit: No Patient Requires Transmission-Based Precautions: No Implantable device outside of the clinic excluding No Patient Has Alerts: Yes cellular tissue based products placed in the center Patient Alerts: Patient on Blood Thinner since last visit: Has Dressing in Place as Prescribed: Yes Pain Present Now: Yes Electronic Signature(s) Signed: 12/05/2020 4:23:50 PM By: Rhae Hammock RN Entered By: Rhae Hammock on 12/04/2020 09:29:49 -------------------------------------------------------------------------------- Encounter Discharge Information Details Patient Name: Date of Service: [name] [name] 12/04/2020 9:00 A M Medical Record Number: [id number] Patient Account Number: [id number] Date of Birth/Sex: Treating RN: February 19, 1946 (75 y.o. Tonita Phoenix, Lauren Primary Care Anabia Weatherwax: [name] Other Clinician: Referring Cesar Rogerson: Treating Mela Perham/Extender: [name] in Treatment: 7 Encounter  Discharge Information Items Post Procedure Vitals Discharge Condition: Stable Temperature (F): 98.7 Ambulatory Status: Wheelchair Pulse (bpm): 74 Discharge Destination: [place] Respiratory Rate (breaths/min): 17 Orders Sent: Yes Blood Pressure (mmHg): 134/74 Transportation: Private Auto Accompanied By: self Schedule Follow-up Appointment: Yes Clinical Summary of Care: Patient Declined Electronic Signature(s) Signed: 12/05/2020 4:23:50 PM By: Rhae Hammock RN Entered By: Rhae Hammock on 12/04/2020 11:57:25 -------------------------------------------------------------------------------- Lower Extremity Assessment Details Patient Name: Date of Service: [name] [name] 12/04/2020 9:00 A M Medical Record Number: [id number] Patient Account Number: [id number] Date of Birth/Sex: Treating RN: 22-Mar-1946 (75 y.o. Tonita Phoenix, Lauren Primary Care Trissa Molina: [name] Other Clinician: Referring Baelynn Schmuhl: Treating Dominigue Gellner/Extender: [name] in Treatment: 7 Edema Assessment Assessed: Shirlyn Goltz: Yes] Patrice Paradise: Yes] Edema: [Left: Yes] [Right: Yes] Calf Left: Right: Point of Measurement: 38 cm From Medial Instep 44 cm 46 cm Ankle Left: Right: Point of Measurement: 11 cm From Medial Instep 38 cm 30 cm Vascular Assessment Pulses: Dorsalis Pedis Palpable: [Left:Yes] Electronic Signature(s) Signed: 12/05/2020 4:23:50 PM By: Rhae Hammock RN Entered By: Rhae Hammock on 12/04/2020 09:31:39 -------------------------------------------------------------------------------- Multi Wound Chart Details Patient Name: Date of Service: [name] [name] 12/04/2020 9:00 A M Medical Record Number: [id number] Patient Account Number: [id number] Date of Birth/Sex: Treating RN: [birth year] (75 y.o. [name] Primary Care Hollis Oh: [name] Other Clinician: Referring Kydan Shanholtzer: Treating Carmino Ocain/Extender: [name] in Treatment: 7 Vital Signs Height(in): Pulse(bpm): 106 Weight(lbs): Blood Pressure(mmHg): 138/73 Body Mass Index(BMI): Temperature(F): 97.4 Respiratory Rate(breaths/min): 17 Photos: [47:No Photos Left, Posterior Upper Leg] [N/A:N/A N/A] Wound Location: [47:Gradually Appeared] [N/A:N/A] Wounding Event: [47:Abrasion] [N/A:N/A] Primary Etiology: [47:Cataracts, Lymphedema, Arrhythmia,] [N/A:N/A] Comorbid History: [47:Congestive Heart Failure, Hypertension, Peripheral Venous Disease, Type II Diabetes, Osteoarthritis, Neuropathy 10/16/2020] [N/A:N/A] Date Acquired: [47:7] [N/A:N/A] Weeks of Treatment: [47:Open] [N/A:N/A] Wound Status: [47:9.5x3x0.1] [N/A:N/A] Measurements L x W x D (cm) [47:22.384] [N/A:N/A] A (cm) : rea [47:2.238] [N/A:N/A] Volume (cm) : [47:-1087.50%] [N/A:N/A] % Reduction in Area: [47:-1090.40%] [N/A:N/A] % Reduction in  Volume: [47:Full Thickness Without Exposed] [N/A:N/A] Classification: [47:Support Structures Medium] [N/A:N/A] Exudate A mount: [47:Serosanguineous] [N/A:N/A] Exudate Type: [47:red, brown] [N/A:N/A] Exudate Color: [47:Distinct, outline attached] [N/A:N/A] Wound Margin: [47:Large (67-100%)] [N/A:N/A] Granulation A mount: [47:Red] [N/A:N/A] Granulation Quality: [47:Small (1-33%)] [N/A:N/A] Necrotic A mount: [47:Fat Layer (Subcutaneous Tissue): Yes N/A] Exposed Structures: [47:Fascia: No Tendon: No Muscle: No Joint: No Bone: No None] [N/A:N/A] Epithelialization: [47:Debridement - Selective/Open Wound N/A] Debridement: Pre-procedure Verification/Time Out 09:45 [N/A:N/A] Taken: [47:Lidocaine] [N/A:N/A] Pain Control: [47:Necrotic/Eschar, Slough] [N/A:N/A] Tissue Debrided: [47:Non-Viable Tissue] [N/A:N/A] Level: [47:28.5] [N/A:N/A] Debridement A (sq cm): [47:rea Curette] [N/A:N/A] Instrument: [47:Minimum] [N/A:N/A] Bleeding: [47:Pressure] [N/A:N/A] Hemostasis A chieved: [47:0] [N/A:N/A] Procedural Pain: [47:0]  [N/A:N/A] Post Procedural Pain: [47:Procedure was tolerated well] [N/A:N/A] Debridement Treatment Response: [47:9.5x3x0.1] [N/A:N/A] Post Debridement Measurements L x W x D (cm) [47:2.238] [N/A:N/A] Post Debridement Volume: (cm) [47:Debridement] [N/A:N/A] Treatment Notes Electronic Signature(s) Signed: 12/04/2020 4:28:25 PM By: Linton Ham MD Signed: 12/05/2020 5:19:32 PM By: Levan Hurst RN, BSN Entered By: Linton Ham on 12/04/2020 09:52:54 -------------------------------------------------------------------------------- Multi-Disciplinary Care Plan Details Patient Name: Date of Service: ANJELLICA, [place] [name] 12/04/2020 9:00 A M Medical Record Number: [id number] Patient Account Number: [id number] Date of Birth/Sex: Treating RN: 04/01/1946 (75 y.o. Tonita Phoenix, Lauren Primary Care Berry Gallacher: [name] Other Clinician: Referring Newman Waren: Treating Roselina Burgueno/Extender: [name] in Treatment: 7 Active Inactive Wound/Skin Impairment Nursing Diagnoses: Impaired tissue integrity Goals: Patient/caregiver will verbalize understanding of skin care regimen Date Initiated: 10/16/2020 Target Resolution Date: 12/07/2020 Goal Status: Active Ulcer/skin breakdown will have a volume reduction of 30% by week 4 Date Initiated: 10/16/2020 Target Resolution Date: 12/07/2020 Goal Status: Active Interventions: Assess patient/caregiver ability to obtain necessary supplies Assess patient/caregiver ability to perform ulcer/skin care regimen upon admission and as needed Assess ulceration(s) every visit Provide education on ulcer and skin care Treatment Activities: Skin care regimen initiated : 10/16/2020 Topical wound management initiated : 10/16/2020 Notes: Electronic Signature(s) Signed: 12/05/2020 4:23:50 PM By: Rhae Hammock RN Entered By: Rhae Hammock on 12/04/2020  09:47:33 -------------------------------------------------------------------------------- Pain Assessment Details Patient Name: Date of Service: Braziel, A NTO [name] 12/04/2020 9:00 A M Medical Record Number: [id number] Patient Account Number: [id number] Date of Birth/Sex: Treating RN: 01/11/1946 (75 y.o. Tonita Phoenix, Lauren Primary Care Nico Syme: [name] Other Clinician: Referring Breanna Mcdaniel: Treating Olvin Rohr/Extender: [name] in Treatment: 7 Active Problems Location of Pain Severity and Description of Pain Patient Has Paino Yes Site Locations Pain Location: Generalized Pain, Pain in Ulcers With Dressing Change: Yes Duration of the Pain. Constant / Intermittento Intermittent Rate the pain. Current Pain Level: 10 Worst Pain Level: 10 Least Pain Level: 0 Tolerable Pain Level: 10 Character of Pain Describe the Pain: Aching Pain Management and Medication Current Pain Management: Medication: No Cold Application: No Rest: No Massage: No Activity: No T.E.N.S.: No Heat Application: No Leg drop or elevation: No Is the Current Pain Management Adequate: Adequate How does your wound impact your activities of daily livingo Sleep: No Bathing: No Appetite: No Relationship With Others: No Bladder Continence: No Emotions: No Bowel Continence: No Work: No Toileting: No Drive: No Dressing: No Hobbies: No Electronic Signature(s) Signed: 12/05/2020 4:23:50 PM By: Rhae Hammock RN Entered By: Rhae Hammock on 12/04/2020 09:31:18 -------------------------------------------------------------------------------- Patient/Caregiver Education Details Patient Name: Date of Service: Widener, A NTO [name] 8/31/2022andnbsp9:00 A M Medical Record Number: [id number] Patient Account Number: [id number] Date of Birth/Gender: Treating RN: 1945-05-23 (75 y.o. Tonita Phoenix, Lauren Primary Care Physician: [name] Other  Clinician: Referring Physician: Treating Physician/Extender: Linton Ham  [name] Weeks in Treatment: 7 Education Assessment Education Provided To: Patient Education Topics Provided Basic Hygiene: Methods: Explain/Verbal Responses: State content correctly Wound/Skin Impairment: Methods: Explain/Verbal Responses: State content correctly Electronic Signature(s) Signed: 12/05/2020 4:23:50 PM By: Rhae Hammock RN Entered By: Rhae Hammock on 12/04/2020 09:44:21 -------------------------------------------------------------------------------- Wound Assessment Details Patient Name: Date of Service: Lebeda, A NTO [name] 12/04/2020 9:00 A M Medical Record Number: [id number] Patient Account Number: [id number] Date of Birth/Sex: Treating RN: 1945-10-31 (75 y.o. Tonita Phoenix, Lauren Primary Care Clariza Sickman: [name] Other Clinician: Referring Rajendra Spiller: Treating Paras Kreider/Extender: [name] in Treatment: 7 Wound Status Wound Number: [id number] Primary Abrasion Etiology: Wound Location: Left, Posterior Upper Leg Wound Open Wounding Event: Gradually Appeared Status: Date Acquired: 10/16/2020 Comorbid Cataracts, Lymphedema, Arrhythmia, Congestive Heart Failure, Weeks Of Treatment: 7 History: Hypertension, Peripheral Venous Disease, Type II Diabetes, Clustered Wound: No Osteoarthritis, Neuropathy Wound Measurements Length: (cm) 9.5 Width: (cm) 3 Depth: (cm) 0.1 Area: (cm) 22.384 Volume: (cm) 2.238 % Reduction in Area: -1087.5% % Reduction in Volume: -1090.4% Epithelialization: None Tunneling: No Undermining: No Wound Description Classification: Full Thickness Without Exposed Support Structures Wound Margin: Distinct, outline attached Exudate Amount: Medium Exudate Type: Serosanguineous Exudate Color: red, brown Foul Odor After Cleansing: No Slough/Fibrino Yes Wound Bed Granulation Amount: Large (67-100%) Exposed  Structure Granulation Quality: Red Fascia Exposed: No Necrotic Amount: Small (1-33%) Fat Layer (Subcutaneous Tissue) Exposed: Yes Tendon Exposed: No Muscle Exposed: No Joint Exposed: No Bone Exposed: No Electronic Signature(s) Signed: 12/05/2020 4:23:50 PM By: Rhae Hammock RN Entered By: Rhae Hammock on 12/04/2020 09:35:26 -------------------------------------------------------------------------------- Vitals Details Patient Name: Date of Service: Sinagra, A NTO [name] 12/04/2020 9:00 A M Medical Record Number: [id number] Patient Account Number: [id number] Date of Birth/Sex: Treating RN: 17-Oct-1945 (75 y.o. Tonita Phoenix, Lauren Primary Care Annaleese Guier: [name] Other Clinician: Referring Dejanee Thibeaux: Treating Christion Leonhard/Extender: [name] in Treatment: 7 Vital Signs Time Taken: 09:30 Temperature (F): 97.4 Pulse (bpm): 106 Respiratory Rate (breaths/min): 17 Blood Pressure (mmHg): 138/73 Reference Range: 80 - 120 mg / dl Electronic Signature(s) Signed: 12/05/2020 4:23:50 PM By: Rhae Hammock RN Entered By: Rhae Hammock on 12/04/2020 09:30:58

## 2020-12-05 NOTE — Progress Notes (Signed)
OZELLA, VANHOOZER ([id number]) Visit Report for 12/04/2020 Debridement Details Patient Name: Date of Service: [name] [name], [name] [name] 12/04/2020 9:00 [name] M Medical Record Number: [id number] Patient Account Number: [id number] Date of Birth/Sex: Treating RN: Oct 05, 1945 (75 y.o. [name] Primary Care Provider: [name] Other Clinician: Referring Provider: Treating Provider/Extender: [name] in Treatment: 7 Debridement Performed for Assessment: Wound #47 Right,Posterior Upper Leg Performed By: Physician Ricard Dillon., MD Debridement Type: Debridement Level of Consciousness (Pre-procedure): Awake and Alert Pre-procedure Verification/Time Out Yes - 09:45 Taken: Start Time: 09:45 Pain Control: Lidocaine T Area Debrided (L x W): otal 9.5 (cm) x 3 (cm) = 28.5 (cm) Tissue and other material debrided: Viable, Non-Viable, Eschar, Slough, Slough Level: Non-Viable Tissue Debridement Description: Selective/Open Wound Instrument: Curette Bleeding: Minimum Hemostasis Achieved: Pressure End Time: 09:45 Procedural Pain: 0 Post Procedural Pain: 0 Response to Treatment: Procedure was tolerated well Level of Consciousness (Post- Awake and Alert procedure): Post Debridement Measurements of Total Wound Length: (cm) 9.5 Width: (cm) 3 Depth: (cm) 0.1 Volume: (cm) 2.238 Character of Wound/Ulcer Post Debridement: Improved Post Procedure Diagnosis Same as Pre-procedure Electronic Signature(s) Signed: 12/04/2020 4:28:25 PM By: Linton Ham MD Signed: 12/05/2020 5:19:32 PM By: Levan Hurst RN, BSN Entered By: Linton Ham on 12/04/2020 09:53:07 -------------------------------------------------------------------------------- HPI Details Patient Name: Date of Service: [name] [name], [name] [name] 12/04/2020 9:00 [name] M Medical Record Number: [id number] Patient Account Number: [id number] Date of Birth/Sex: Treating RN: [birth year] (75 y.o. [name] Primary Care Provider: [name] Other Clinician: Referring Provider: Treating Provider/Extender: [name] in Treatment: 7 History of Present Illness HPI Description: 04/09/15; the patient is here for review of wounds on her bilateral lower extremities for the last month. The patient has [name] history of lymphedema and bilateral chronic venous insufficiency with inflammation she was here for review of wounds on her bilateral legs in 2014 she was discharged home with external compression pumps which she is currently not using. Over the last month she developed wounds on her bilateral lower legs with drainage and pain and she is here for our review of this. 04/16/15; the patient's edema is under much better control. She has 3 areas one on the right anterior medial leg one on the left posterior leg and [name] small open area with purulent drainage on the left anterior leg. I have cultured the area on the left anterior leg 04/23/15; continued improvement in the patient's edema. All her wounds of closed I see no open areas. Culture from the left anterior leg last time was negative. READMISSION 02/07/16; this patient is [name] woman that I saw her earlier in 2017. I believe we discharged her very quickly with bilateral wounds in her legs secondary to chronic venous hypertension with inflammation and secondary lymphedema. She had juxtalite stockings. She has external compression pumps at home from [name] stay in our clinic in 2014. She tells me that she's had wounds on her bilateral lower legs since March or April of this year. 3 wounds on the left 2 on the right. She is [name] type II diabetic and has [name] history of noncompressible vessels bilaterally although she is tolerated 4-layer compression. The last note from her primary physician Dr. [name] was in May. At that point Dr. [name] had referred the patient here for lymphedema management. The patient stated she made  [name] phone call to year but was not able to arrange an appointment. It is not clear that she is actually using anything on  the wounds currently except some gauze that was saturated with drainage per our intake nurse. She is certainly not using her juxtalite stockings and by the patient's own admission she is not using her compression pumps because they are "irritating". I don't see [name] recent hemoglobin A1c. The patient states she has not been systemically unwell but the legs are painful 02/14/16; patient tolerated her Profore wraps reasonably well. She is using the compression pumps 1 time per day. Measurements of her legs are better in terms of the amount of lymphedema 02/21/16; patient tolerated her Profore wraps it. She says she is using her compression pumps one or 2 times [name] day. Measurements of her leg circumference are improved and most of her wound dimensions are better, unfortunately the home health that we referred her to did not except her insurance but works successful in getting her accompanied that would except her insurance however she apparently has [name] $25 co-pay which she cannot afford as she is on [name] fixed income 03/05/16; patient states she did not back using the pumps. She is healed that 2 wounds on the left leg. She still has areas on the right lateral lower leg, right medial lower leg and left lateral lower leg. All of these look some better and have reduced in area 03/13/16; still 3 wounds 1 on the left lateral leg, one on the left medial/posterior leg which is really the most extensive and [name] small area on the right lateral leg. All of this in the setting of severe chronic lymphedema and chronic venous inflammation. Damage to the skin with cobblestone thickened skin. 03/20/16; still 2 areas. The area on the left lateral leg appears to of closed over. The right medial leg is still most extensive wound although it appears to be closing over right lateral only [name] small open area remains.  Using silver alginate Profore wraps 03/27/16; the area on the left leg remains closed. The edema control is better on the left than the right. The right medial leg is still weeping but everything appears to be epithelialized. The lateral aspect of the right leg appears closed. She tells me she has old juxtalite stockings. She has been compliant with her compression pumps. 04/03/16; the area on the left leg remains closed. She has ordered new juxtalite stockings. The right posterior medial wound is fully epithelialized. However she has an irritated area over the right Achilles that she states is been itching all week under the wraps.. She has been compliant with her compression pumps 04/10/16; the area on the left leg remains closed. The right posterior medial wound is also closed today. The irritated area over the right Achilles which was probably wrap injury is also fully epithelialized and closed. The patient has not received her new juxtalite stockings, she arrives in clinic today with an old juxtalite stocking in place. Sheis using her compression pumps secondary to her severe stage III lymphedema READMISSION 01/21/17; this is [name] patient we know from several previous stays in this clinic. She has lymphedema with chronic venous insufficiency and inflammation. The last time she was here we discharged her with bilateral juxta lites which she is compliant with. She also has external compression pumps at home from stays in our clinic in 2014 although she tells me that she is not very compliant with these as when she uses them [name] causes pain in her bilateral knees the patient states that her wounds on her left greater than right leg including 2 large areas on the  left anterior and left lateral leg and an area on the right leg open in late June or July since then she is been followed by [facility] and she's had bilateral Unna boot supplied every week for the last month.. I think the wounds have actually  improved although they are not specifically dressed. She states she had an ultrasound to rule out DVT bilaterally that was negative. She does not have arterial studies although she is [name] diabetic. She states she has [name] lot of pain in her legs she states she does not use her external compression pumps because it causes pain in her knees. As usual her ABIs were noncompressible bilaterally in this clinic 01/28/17; currently the patient only has one small wound on the left lateral leg and I think this should be healed next week. We are going to try to get her bilateral extremitease stockings. She tells me that compression pumps heard her posterior rib arthritic knees so she is not been compliant with this 02/04/17; the patient's wounds are totally healed. She has chronic venous insufficiency and secondary lymphedema. She has new extremitease stockings. She also has compression pumps Readmission: 04/21/17 on evaluation today patient appears to be doing about the same as what she was previous when we were evaluating her back in November 2018. At that point she had completely healed. With that being said she has now reopened and states that she wasn't aware that she was supposed to come back. 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 Subsequently she also states that although she has the compression that we got for her previously the extremitease stockings she nonetheless doesn't always wear these and being noncompliant often has things will reopen as far as ulcers on her lower extremities. No fevers, chills, nausea, or vomiting noted at this time. Patient has no evidence of dementia. She is having some discomfort in regard to the ulcers at this point. 04/28/17 on evaluation today patient appears to be doing significantly better in regard to her wounds. With that being said she is still having  significant discomfort and is not really keen on sharp debridement. She does not appear to have any evidence of infection which is good news I do think the Iodoflex is beneficial for her. Of note her ABI's were found in the system to be on the right 1.21 with [name] TBI of 0.76 and on the left 1.35 with [name] TBI of 0.75. In general her swelling appears to be significantly improved however. No fevers, chills, nausea, or vomiting noted at this time. Patient has been tolerating the wraps without complication. She has no evidence of dementia. 05/05/17-she is here in follow-up evaluation for bilateral lower extremity venous and lymphedema ulcers. She states she has not been using her lymphedema pumps secondary to pain at her knees. After discussion it was noted that she uses knee-high lymphedema pumps but does have [name] pair of thigh-high lymphedema pumps. She was strongly advised to use the thigh-high lymphedema pumps and if necessary reduce the setting for improved tolerance. She reports no change in odor and/or drainage. She continues to tolerate sharp debridement poorly with significant pain, primarily to the right lower extremity. We will continue with current treatment plan, along with improved lymphedema pump use and follow-up next week 05/12/17 on evaluation today patient actually appears to be doing much better in regard to her bilateral lower extremity ulcers. Only  the right altar actually requires debridement today. The other two cleaned off nicely in two of the three in fact on the left lower extremity or actually almost completely healed. She does have some weeping noted still the bilateral lower extremities left greater than right. She still is not able to use her compression pumps even though I have mentioned this several times to her as did Leah last week. No fevers, chills, nausea, or vomiting noted at this time. Patient has been tolerating the dressing changes without complication. Patient has no  evidence of dementia 05/19/17 on evaluation today patient appears to be doing very well regard to her bilateral lower extremity ulcers. In fact she seems to be improving and in fact healed in [name] couple of locations that were giving her trouble last week. Overall I'm extremely pleased with how things have progressed. She still has some discomfort especially in the right lateral lower extremity. Fortunately there does not appear to be any evidence of significant infection which is good news she does tell me that she has used her compression pumps several times although not routinely over the past week I did praise her for this. I do think it will be beneficial and that might even be what's helping with her wounds as far as healing is concerned at least to [name] degree. No fevers, chills, nausea, or vomiting noted at this time. Patient has no dementia and no evidence of infection. 05/26/17 on evaluation today patient presents for evaluation concerning her bilateral lower extremity ulcer secondary to lymphedema. Fortunately she appears to be doing very well at this point in time. Specifically her right leg appears to be completely closed and healed. The left leg though there is still [name] small area of opening overall has healed. She does have [name] little bit of excoriation at the top or the wraps are because they slid down on her. Otherwise there's no evidence of infection and the other has improved dramatically now that she is not draining as significantly. Patient is having no significant pain she tells me she has been able to use her lymphedema pumps one time [name] day although that's not all she can tolerate. Even that is difficult for her. 06/02/17 on evaluation today patient's ulcers appear to be completely closed at this point. She does still note some odor but I believe this is due to the fact she has not been able to wash her legs as well currently. Fortunately she did bring her compression with her today. She does  have the EXTREMIT-EASE Compression. With that being said she is somewhat nervous about going back to this as previously she broke out yet again once we discontinue wrapping her. READMISSION 11/23/17; patient is now 75 year old diabetic woman with severe chronic venous insufficiency with lymphedema. We care for her last in this clinic in February 2019 at which time she had chronic venous insufficiency with lymphedema bilateral lower extremity ulcers. We discharged her with bilateral wrap around/extremitease stockings and compression pumps. Is fairly clear she is not using the compression pumps stating it causes pain in her knees. In any case she was admitted to hospital from 8/3 through 8/6 with [name] several week history of recurrent bilateral ulcers. She was felt to have bilateral cellulitis treated with doxycycline IV. She is still on oral doxycycline and has another 2 days worth. At discharge her white count was 14.1 hemoglobin at 8.9. The patient is [name] diabetic however her last ABIs in 2018 showed [name] right ABI of 1.21,  left of 1.35. TBI's were 0.76 on the right 0.75 on the left. Posterior tibial artery was monophasic on the right triphasic on the left. Dorsalis pedis triphasic on the right and triphasic on the left. She is not felt to have significant macrovascular disease 12/02/17;significant bilateral lower extremity wounds secondary to severe uncontrolled lymphedema. She did not get home health out to change the wraps. So she kept this on all week. She is telling me she using his her compression pumps once [name] week. In general all the wounds look better. We've been using silver alginate with secondary absorptive dressings 4 layer compression 12/10/17; bilateral lower extremity wounds secondary to uncontrolled lymphedema. She tells me she is using her compression pumps on most days. We've been keeping wraps on all week. Using silver alginate. She has wounds on the right lateral calf left medial and left  lateral calf. All of this is somewhat better 12/16/17; the patient has 3 small open areas. One on the right lateral calf, one on the left medial calf and the small area on the left lateral calf. She has lymphedema, chronic venous insufficiency with hemosiderin deposition. Very fibrotic distal lower extremity scan with suggestion of an inverted bottle sign appearance to her legs. She has compression pumps but she does not use them has not used them in the last week. Currently we have her in 4 layer compression, silver alginate to the wounds and this is being changed once by home health. She is noncompliant with the compression pumps because she says it hurts her knees. She also has compression stockings ando Juxta light stockings at home 12/24/17; the patient's right lateral calf is healed as well as the left medial. There is no open wound on the right leg. She still has 2 areas on the left lateral calf. We transitioned the right leg into an extremitease stocking which she brought in from home. 12/31/17; the patient has [name] superficial wound on the left lateral lower calf. We've been wrapping this leg. She arrives today with the right leg swollen but without [name] wound. She cannot get her extremities stocking on the right leg. There is [name] lot more edema here. She is not using her compression pumps 01/10/2018; patient has [name] same superficial wound on the left lateral calf. Her right leg has less peripheral edema. She tells me she also started herself on some Lasix that had been previously prescribed for her at some point but she had never taken it. She is also concerned about whether her compression pumps are leaking. She has extremitease stockings however the edema gets bad enough she can get these on. Currently she has no open wound on the right leg and is mention the edema is actually better than when I saw this 10 days ago 01/17/2018; patient has [name] same superficial area on the posterior left calf and [name] weeping  area in the mid part of the right lateral calf. She still does not have anyone from [facility] coming to see her compression pumps which she describes as being suspicious for [name] leakage. I am not really sure how frequently she is using this in any case. She has massive bilateral calf lymphedema. I think there is some spreading lymphedema into her posterior thighs. 01/24/2018; the patient's edema in her legs is actually some better. She still has [name] smaller area on the left posterior calf although this is better. The weeping area on the right lateral calf is also better. As it turned out she did  not get her pumps from [facility] but [facility] and they are going to try to go out and see these this week which is going to help. I explained to her that she will use the compression pumps once [name] day while we are wrapping her legs but when she transitions to stockings that may be she will require pumping twice [name] day. Her compliance in this area has not been high and I wonder about her ability to do this herself. 02/01/2018; the area on the posterior left leg is healed. She still has the original wound on the right lateral calf which is epithelialized. The however just above this there is still weeping lymphedema fluid. She has [name] new area on the upper right calf which is either wrap injury or is she points out where she has been scratching under the wraps. We still not have managed to get [name] hold of [name] representative of [facility] to see if the pump is operational. She has extremitease stockings at home however I am not of the opinion that this will maintain her skin integrity without the compression pumps. 02/08/2018; posterior left leg remains healed although she has [name] new small open area on the left lateral calf. Right lateral calf has two quarter sized open areas and close juxtaposition. There is less weeping edema fluid. She apparently managed to contact [facility]. They are  sending her [name] part. But she is still not received it 02/15/2018; the patient has no open wound on either leg. Some scale present on the right upper lateral calf just below the fibular head. Edema control is excellent. She did not bring her stockings or her juxta lite wrap around. She has the part for her compression pump but she has not use the compression pumps yet READMISSION 01/10/2019 Patient is now [name] 75 year old woman that we have had in the clinic multiple times in the past. She has chronic lower extremity lymphedema, recurrent wounds on her bilateral lower extremities. She is also [name] type II diabetic reasonably well controlled with [name] recent hemoglobin A1c while she was in the hospital of 5.9. She has wounds on her bilateral lower extremities mostly on the left lateral and right lateral. These were noted also during her recent hospitalization. The patient states they have been there for [name] while. She has not been able to wear her stockings he has trouble getting them on as she lives alone. She also has external compression pumps but uses them sparingly because it causes pain in her knees which hurt because of osteoarthritis. The patient was on doxycycline before she went into the hospital apparently for fear of infection in her legs and is on Keflex coming out because of [name] UTI The patient was recently in the hospital from 01/06/2019 through 01/09/2019. She was admitted with nausea and vomiting and prerenal azotemia. This was corrected with IV fluid her Lasix has been put on hold. Hemoglobin A1c at 5.9 her metformin was discontinued. She was also felt to have [name] UTI she was prescribed Keflex at 503 times [name] day she is picking that up today. The patient is not felt to have an arterial issue. ABIs in our clinic were 1.24 on the right and 1.06 on the left. She had formal studies in 2018 at which time her ABIs were 1.21 on the right TBI of 0.76 and on the left 1.35 and 0.75 respectively. Waveforms are on  the right were monophasic and biphasic, triphasic and biphasic on the left.  She is tolerated 4 layer compression in the past 10/13. Comes in today having not used her compression pumps /perhaps once in the last week. She has too much edema to consider healing these wounds. We have been using silver alginate 10/20; she tells me he had she used her compression pumps once [name] day as we asked. Clearly she has edema out of her legs although most of it seems to be settling around her knees which is what she complains about. We have been using silver alginate to the extensive de-epithelialized area on the left lateral calf and [name] much smaller area on the right lateral extending linearly into the right posterior 10/27; she is using her compression pumps daily. Her edema control is better. We have been using silver alginate to the extensive wound areas on her bilateral lower legs. This includes left anterior left lateral and left posterior. Right anterior right lateral and right posterior. The wounds are not all in the same condition. She has severe bilateral skin damage with the epithelialization, flaking dried epithelium 11/3; she is using her compression pumps 1 times daily. Pretty obvious that her wraps fell down especially on the left leg to mid calf. I think things are improving on the right lateral calf, I do not see anything open posteriorly. The major areas on the left lateral where she has 3 or 4 deeper wounds in the midst of an entire de-epithelialized area. There is erythema here but no tenderness I think this represents venous stasis rather than cellulitis. I debrided the areas on the left lateral calf last week but once again they have tightly adherent debris over the top of them which is disappointing 11/10; the patient's major areas on the left lateral calf to 3 wounds with some depth surrounded by [name] large area of de-epithelialized tissue. There is no infection. We have been using silver alginate  under 4-layer compression. On the right lateral she has [name] more contained wound area that is superficial. Been using silver alginate under 4-layer compression 11/17; the patient's major wound is on the left lateral calf. We have been using silver alginate under compression. There is no open wound per se on the right but it de-epithelialized area on the lateral calf that is weeping edema fluid. I do not see much change here today. 12/1; patient arrives in clinic with her wounds looking better. Her edema control is better. The lymphedema specialist working on the left leg we have been working on the right. We have been using 4-layer compression. The patient is using [name] consider external compression pumps and Hydrofera Blue to the open areas. She is being measured for an abdominal compression pump and apparently that is going to go through her insurance 12/15; continued improvement. 2 small areas on the left and slightly larger 2 small areas on the right. 12/22; patient still has open areas on the right lateral calf which are superficial and the new one on the right anterior calf. She did not use her compression pumps this week. Her left leg is wrapped by our lymphedema specialist. There are no open wounds here 12/29; she still has [name] superficial area on the right lateral calf and area on the right medial calf. Right anterior wound area appears to have closed. She has much better edema control today than last week 04/11/2019; the patient is here with severe bilateral lymphedema, chronic venous insufficiency with marked skin changes in her lower extremities. We are wrapping her right leg and her lymphedema specialist  is putting lymphedema wraps on the left. She states she is using her compression pumps although in the past her compliance with uses been limited because of osteoarthritic pain in her knees. She only has one remaining area on the right leg however she has wrap injuries on the left at the upper  level what where I think her wraps were cause. the left lateral area left posterior. These would be new this week 1/12; generally better. The wrap injuries in the upper calf on the left from last week are down to [name] single wound. She has 2 wounds anteriorly that are still weeping fluid although these appear to have been epithelialized surface for the most part. There is no open wounds on the right leg we are wrapping her right leg and 4-layer compression and we are going [name] lymphedema wrap on the left leg. The the approach is [name] Farrow 2000 wrap on the left leg and then subsequently the right leg. She claims to be compliant with her compression pumps 1/19; the patient has 1 tiny open area on the right medial calf. The rest of this is not open. She however has [name] large erythematous denuded area on the left anterior tibia extending laterally. There is no evidence of infection here. She has her new current waist high compression pumps 1/26; the patient has no open wounds on the right. On the left anteriorly much better than last week still [name] smattering of small weeping open areas. There is no evidence of infection 2/2; still no open wounds on the right leg. He still does not have [name] external compression garment. She arrives in clinic today with an open area on the plantar aspect of the left second toe at the base of the PIP. This apparently was painful last week so it was not wrapped by our lymphedema specialist. There was no open wound at the time. The patient denies traumatizing the toe however today she arrives with [name] small but deep wound in this area the dorsal surface of the toe is very tender. 2/11; she arrives in clinic today with everything epithelialized. She was that way last week on the right but we have still been wrapping. She has been using lymphedema wraps on the left. On careful inspection of the left leg she still has areas that look vulnerable but no clearly open wound there may be  some weeping through cracks in the skin. She is using above the waist external compression pumps and she has [name] separate external compression garment for her thighs. The plan is to discharge her for follow-up in lymphedema clinic with Juliann Pulse our lymphedema specialist. In the past healing this woman has resulted in relatively quick breakdown however she generally seems more motivated example willing to use her external compression pumps etc. READMISSION 02/13/2020. Mrs. Asturias is [name] now 75 year old woman who has bilateral lower extremity lymphedema and [name] history of difficult to treat bilateral lower extremity wounds. When she was here last time we had her seen by [name] lymphedema specialist. She had lower extremity compression stockings as well as compression garments for her thighs. She has compression pumps as well. Unfortunately she has not been doing well. She describes unrelenting nausea with vomiting anytime she tries to eat or drink or take her medications. She was seen in urgent care on 10/26. Notably they found her to be somewhat dehydrated. Gave her [name] liter of fluid. Her creatinine was up to 2.42. The rest of her lab work including liver function tests,  white count, hemoglobin were all normal. She did have [name] urine for culture that showed Klebsiella she was prescribed ciprofloxacin but I seriously doubt that she is able to keep this down by her description. She has not taken any of her meds in over [name] month. She states she feels too weak to take care of her lower extremities with compression garments and her external compression pumps she has not been using any of these. As [name] result she has 2 large areas on the left lateral calf one anteriorly [name] cluster of 3 on the right lateral and [name] small area on her right ankle. All of these are superficial. She has poorly controlled lymphedema especially on the anterior tibial areas however by my my anxiety she actually looks like she has less swelling than what I  remember. Past medical history includes type 2 diabetes with [name] recent hemoglobin A1c of 6.8 chronic venous insufficiency, lymphedema which she was managing with above the waist compression and compression pumps, diastolic congestive heart failure. Nausea and vomiting as described above. We did not repeat her arterial studies today however these were quite normal when they were last done in 2018 with an ABI of 1.21 on the right and 1.35 on the left. TBI's were within normal limits at 0.76 and 0.75 she had triphasic waveforms on the left monophasic waveforms at the posterior tibial but triphasic waveforms at the dorsalis pedis. She has previously tolerated 4 layer compression 11/18; the patient has [name] cluster of wounds on the right lateral lower calf and areas on the left lateral calf. We have been using silver alginate under compression she seems to be tolerating this well. Her edema is improved. HOWEVER she arrives in clinic today still with [name] description of bilious vomiting that is making it impossible for her to eat and drink. Her vitals are stable however she looks more gaunt. I have tried to talk her into going to the emergency room to be checked out however she will not hear anything of it 11/29; the patient's right leg is closed. On the left leg she has 2 small superficial open areas anteriorly and one just to the lateral part of the tibia. These are just about closed. She did go to the ER with regards to her persistent nausea and vomiting. Apparently they did not find anything and did [name] CT scan of her abdomen although I have not yet had [name] chance to review these records Readmission: 10/16/2020 upon evaluation today patient presents for reevaluation here in the clinic due to issues that she is can have one of the bilateral lower extremities. More importantly she has an issue which looks to be more of [name] friction injury to be honest in the posterior upper thigh on the left. Again this appears  to potentially be where [name] brief could have been pulled [name] little bit causing [name] friction injury. Again it does not look like [name] pressure injury at all very superficial not really draining much at all. Fortunately there does not appear to be any signs of active infection at this time which is great news. No fevers, chills, nausea, vomiting, or diarrhea. Patient does have [name] history of bilateral lower extremity lymphedema, hypertension, congestive heart failure, and long-term use of anticoagulant therapy. She is currently on Eliquis. Unfortunately since we last seen her she apparently ended up apparently fallen in her home and she was subsequently found on the ground by her daughter admitted to the hospital for altered mental status and  weakness she was found to have [name] UTI and Warnicke's encephalopathy. Upon discharge mental status had improved she had fevers which were thought to be potentially due to pseudogout flare. She did improve with steroids. She also had an EGD which showed reflux esophagitis and multiple large and small nonbleeding duodenal ulcers. She was discharged on 04/08/2020 to [name] for [facility] she has been there since. It was her daughter who actually found her as she was not able to get in touch with her mom. For that reason she called the police for [name] safety check and found her lying on the floor covered with stool and bugs crawling all over her. She was seen by her PCP for intractable nausea and vomiting 4 to 6 weeks prior. She was found to have profound weakness which honestly has not really improved much at this point. Her CT scan was negative. 8/31; the patient returns to clinic now at [name] local nursing home I believe on [place]. It sounds as though she is been discharged for rehab and is now [name] chronic care patient in the facility. 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 She is cognitively intact. She comes in the clinic with the  wound on her left medial upper thigh. She has kerlix Coban wraps on her bilateral lower extremities and [name] vague history of wound on her buttock. Supposed to be using Xeroform according to [facility] last notes from July although she came in with nothing on the thigh wound today Electronic Signature(s) Signed: 12/04/2020 4:28:25 PM By: Linton Ham MD Entered By: Linton Ham on 12/04/2020 09:54:48 -------------------------------------------------------------------------------- Physical Exam Details Patient Name: Date of Service: [name] [name], [name] [name] 12/04/2020 9:00 [name] M Medical Record Number: [id number] Patient Account Number: [id number] Date of Birth/Sex: Treating RN: May 31, 1945 (75 y.o. [name] Primary Care Provider: [name] Other Clinician: Referring Provider: Treating Provider/Extender: [name] in Treatment: 7 Constitutional Sitting or standing Blood Pressure is within target range for patient.. Pulse regular and within target range for patient.Marland Kitchen Respirations regular, non-labored and within target range.. Temperature is normal and within the target range for the patient.Marland Kitchen Appears in no distress. Notes Wound exam; we did [name] inspection of this patient's buttocks lower legs and were unable to identify any additional wounds. On the area on the right medial thigh she has 2 wounds in close juxtaposition. Totally covered and surrounded by dried thick eschar and slough. I painstakingly remove this with [name] #5 curette the surfaces of the wounds do not look particularly ominous. No surrounding soft tissue involvement. She does not have wounds on her lower legs although she does have changes of chronic lymphedema and venous insufficiency. She has [name] healed wound on the left buttock no open wounds here either. Electronic Signature(s) Signed: 12/04/2020 4:28:25 PM By: Linton Ham MD Entered By: Linton Ham on 12/04/2020  09:57:44 -------------------------------------------------------------------------------- Physician Orders Details Patient Name: Date of Service: [place], [name] [name] 12/04/2020 9:00 [name] M Medical Record Number: [id number] Patient Account Number: [id number] Date of Birth/Sex: Treating RN: May 27, 1945 (75 y.o. Tonita Phoenix, Lauren Primary Care Provider: [name] Other Clinician: Referring Provider: Treating Provider/Extender: [name] in Treatment: 7 Verbal / Phone Orders: No Diagnosis Coding Follow-up Appointments Return appointment in 1 month. - ***75 minutes [facility] life**** Dr. Dellia Nims Other: - Patient resides at [place] Bathing/ Shower/ Hygiene May shower and wash wound with soap and water. Edema Control - Lymphedema /  SCD / Other Bilateral Lower Extremities Elevate legs to the level of the heart or above for 30 minutes daily and/or when sitting, [name] frequency of: Moisturize legs daily. Other Edema Control Orders/Instructions: - Apply Kerlix/Coban 2 layer wrap to bilateral legs, change 2x week.4 [facility] can order compression stockings 20-27m/Hg and pt. can wear those instead of the kerlix/coban wrap.e Off-Loading Other: - Try to minimize friction/shearing to area Additional Orders / Instructions Follow Nutritious Diet Wound Treatment Wound #47 - Upper Leg Wound Laterality: Right, Posterior Cleanser: Soap and Water 3 x Per Week/30 Days Discharge Instructions: May shower and wash wound with dial antibacterial soap and water prior to dressing change. Cleanser: Wound Cleanser 3 x Per Week/30 Days Discharge Instructions: Cleanse the wound with wound cleanser prior to applying [name] clean dressing using gauze sponges, not tissue or cotton balls. Prim Dressing: Xeroform Occlusive Gauze Dressing, 4x4 in 3 x Per Week/30 Days ary Discharge Instructions: Apply to wound bed as instructed Secondary Dressing: Zetuvit Plus Silicone Border Dressing  4x4 (in/in) 3 x Per Week/30 Days Discharge Instructions: Or equivalent bordered foam Electronic Signature(s) Signed: 12/04/2020 4:28:25 PM By: RLinton HamMD Signed: 12/05/2020 4:23:50 PM By: BRhae HammockRN Entered By: BRhae Hammockon 12/04/2020 09:51:02 -------------------------------------------------------------------------------- Problem List Details Patient Name: Date of Service: [place], [name] [name] 12/04/2020 9:00 [name] M Medical Record Number: [id number] Account Number: [id number] of Birth/Sex: Treating RN: [birth year](75y.o. FNancy FetterPrimary Care Provider: CPricilla HolmOther Clinician: Referring Provider: Treating Provider/Extender: RCharlie Pitterin Treatment: 7 Active Problems ICD-10 Encounter Code Description Active Date MDM Diagnosis I89.0 Lymphedema, not elsewhere classified 10/16/2020 No Yes M79.89 Other specified soft tissue disorders 10/16/2020 No Yes L97.122 Non-pressure chronic ulcer of left thigh with fat layer exposed 10/16/2020 No Yes I10 Essential (primary) hypertension 10/16/2020 No Yes I50.42 Chronic combined systolic (congestive) and diastolic (congestive) heart failure 10/16/2020 No Yes Z79.01 Long term (current) use of anticoagulants 10/16/2020 No Yes Inactive Problems Resolved Problems Electronic Signature(s) Signed: 12/04/2020 4:28:25 PM By: RLinton HamMD Signed: 12/04/2020 4:28:25 PM By: RLinton HamMD Entered By: RLinton Hamon 12/04/2020 09:52:47 -------------------------------------------------------------------------------- Progress Note Details Patient Name: Date of Service: [place], [name] [name] 12/04/2020 9:00 [name] M Medical Record Number: [id number] Account Number: [id number] of Birth/Sex: Treating RN: 7Feb 21, 1947(75y.o. FNancy FetterPrimary Care Provider: CPricilla HolmOther Clinician: Referring Provider: Treating Provider/Extender: RCharlie Pitterin Treatment: 7 Subjective History of Present Illness (HPI) 04/09/15; the patient is here for review of wounds on her bilateral lower extremities for the last month. The patient has [name] history of lymphedema and bilateral chronic venous insufficiency with inflammation she was here for review of wounds on her bilateral legs in 2014 she was discharged home with external compression pumps which she is currently not using. Over the last month she developed wounds on her bilateral lower legs with drainage and pain and she is here for our review of this. 04/16/15; the patient's edema is under much better control. She has 3 areas one on the right anterior medial leg one on the left posterior leg and [name] small open area with purulent drainage on the left anterior leg. I have cultured the area on the left anterior leg 04/23/15; continued improvement in the patient's edema. All her wounds of closed I see no open areas. Culture from the left anterior leg last time was negative. READMISSION 02/07/16; this patient is [name] woman that I saw her  earlier in 2017. I believe we discharged her very quickly with bilateral wounds in her legs secondary to chronic venous hypertension with inflammation and secondary lymphedema. She had juxtalite stockings. She has external compression pumps at home from [name] stay in our clinic in 2014. She tells me that she's had wounds on her bilateral lower legs since March or April of this year. 3 wounds on the left 2 on the right. She is [name] type II diabetic and has [name] history of noncompressible vessels bilaterally although she is tolerated 4-layer compression. The last note from her primary physician Dr. [name] was in May. At that point Dr. [name] had referred the patient here for lymphedema management. The patient stated she made [name] phone call to year but was not able to arrange an appointment. It is not clear that she is actually using anything on the  wounds currently except some gauze that was saturated with drainage per our intake nurse. She is certainly not using her juxtalite stockings and by the patient's own admission she is not using her compression pumps because they are "irritating". I don't see [name] recent hemoglobin A1c. The patient states she has not been systemically unwell but the legs are painful 02/14/16; patient tolerated her Profore wraps reasonably well. She is using the compression pumps 1 time per day. Measurements of her legs are better in terms of the amount of lymphedema 02/21/16; patient tolerated her Profore wraps it. She says she is using her compression pumps one or 2 times [name] day. Measurements of her leg circumference are improved and most of her wound dimensions are better, unfortunately the home health that we referred her to did not except her insurance but works successful in getting her accompanied that would except her insurance however she apparently has [name] $25 co-pay which she cannot afford as she is on [name] fixed income 03/05/16; patient states she did not back using the pumps. She is healed that 2 wounds on the left leg. She still has areas on the right lateral lower leg, right medial lower leg and left lateral lower leg. All of these look some better and have reduced in area 03/13/16; still 3 wounds 1 on the left lateral leg, one on the left medial/posterior leg which is really the most extensive and [name] small area on the right lateral leg. All of this in the setting of severe chronic lymphedema and chronic venous inflammation. Damage to the skin with cobblestone thickened skin. 03/20/16; still 2 areas. The area on the left lateral leg appears to of closed over. The right medial leg is still most extensive wound although it appears to be closing over right lateral only [name] small open area remains. Using silver alginate Profore wraps 03/27/16; the area on the left leg remains closed. The edema control is better on the  left than the right. The right medial leg is still weeping but everything appears to be epithelialized. The lateral aspect of the right leg appears closed. She tells me she has old juxtalite stockings. She has been compliant with her compression pumps. 04/03/16; the area on the left leg remains closed. She has ordered new juxtalite stockings. The right posterior medial wound is fully epithelialized. However she has an irritated area over the right Achilles that she states is been itching all week under the wraps.. She has been compliant with her compression pumps 04/10/16; the area on the left leg remains closed. The right posterior medial wound is also closed today. The irritated  area over the right Achilles which was probably wrap injury is also fully epithelialized and closed. The patient has not received her new juxtalite stockings, she arrives in clinic today with an old juxtalite stocking in place. Sheis using her compression pumps secondary to her severe stage III lymphedema READMISSION 01/21/17; this is [name] patient we know from several previous stays in this clinic. She has lymphedema with chronic venous insufficiency and inflammation. The last time she was here we discharged her with bilateral juxta lites which she is compliant with. She also has external compression pumps at home from stays in our clinic in 2014 although she tells me that she is not very compliant with these as when she uses them [name] causes pain in her bilateral knees the patient states that her wounds on her left greater than right leg including 2 large areas on the left anterior and left lateral leg and an area on the right leg open in late June or July since then she is been followed by [facility] and she's had bilateral Unna boot supplied every week for the last month.. I think the wounds have actually improved although they are not specifically dressed. She states she had an ultrasound to rule out DVT bilaterally that was  negative. She does not have arterial studies although she is [name] diabetic. She states she has [name] lot of pain in her legs she states she does not use her external compression pumps because it causes pain in her knees. As usual her ABIs were noncompressible bilaterally in this clinic 01/28/17; currently the patient only has one small wound on the left lateral leg and I think this should be healed next week. We are going to try to get her bilateral extremitease stockings. She tells me that compression pumps heard her posterior rib arthritic knees so she is not been compliant with this 02/04/17; the patient's wounds are totally healed. She has chronic venous insufficiency and secondary lymphedema. She has new extremitease stockings. She also has compression pumps Readmission: 04/21/17 on evaluation today patient appears to be doing about the same as what she was previous when we were evaluating her back in November 2018. At that point she had completely healed. With that being said she has now reopened and states that she wasn't aware that she was supposed to come back. 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 Subsequently she also states that although she has the compression that we got for her previously the extremitease stockings she nonetheless doesn't always wear these and being noncompliant often has things will reopen as far as ulcers on her lower extremities. No fevers, chills, nausea, or vomiting noted at this time. Patient has no evidence of dementia. She is having some discomfort in regard to the ulcers at this point. 04/28/17 on evaluation today patient appears to be doing significantly better in regard to her wounds. With that being said she is still having significant discomfort and is not really keen on sharp debridement. She does not appear to have any evidence of infection which is  good news I do think the Iodoflex is beneficial for her. Of note her ABI's were found in the system to be on the right 1.21 with [name] TBI of 0.76 and on the left 1.35 with [name] TBI of 0.75. In general her swelling appears to be significantly improved however. No fevers, chills, nausea, or vomiting  noted at this time. Patient has been tolerating the wraps without complication. She has no evidence of dementia. 05/05/17-she is here in follow-up evaluation for bilateral lower extremity venous and lymphedema ulcers. She states she has not been using her lymphedema pumps secondary to pain at her knees. After discussion it was noted that she uses knee-high lymphedema pumps but does have [name] pair of thigh-high lymphedema pumps. She was strongly advised to use the thigh-high lymphedema pumps and if necessary reduce the setting for improved tolerance. She reports no change in odor and/or drainage. She continues to tolerate sharp debridement poorly with significant pain, primarily to the right lower extremity. We will continue with current treatment plan, along with improved lymphedema pump use and follow-up next week 05/12/17 on evaluation today patient actually appears to be doing much better in regard to her bilateral lower extremity ulcers. Only the right altar actually requires debridement today. The other two cleaned off nicely in two of the three in fact on the left lower extremity or actually almost completely healed. She does have some weeping noted still the bilateral lower extremities left greater than right. She still is not able to use her compression pumps even though I have mentioned this several times to her as did Leah last week. No fevers, chills, nausea, or vomiting noted at this time. Patient has been tolerating the dressing changes without complication. Patient has no evidence of dementia 05/19/17 on evaluation today patient appears to be doing very well regard to her bilateral lower extremity ulcers. In  fact she seems to be improving and in fact healed in [name] couple of locations that were giving her trouble last week. Overall I'm extremely pleased with how things have progressed. She still has some discomfort especially in the right lateral lower extremity. Fortunately there does not appear to be any evidence of significant infection which is good news she does tell me that she has used her compression pumps several times although not routinely over the past week I did praise her for this. I do think it will be beneficial and that might even be what's helping with her wounds as far as healing is concerned at least to [name] degree. No fevers, chills, nausea, or vomiting noted at this time. Patient has no dementia and no evidence of infection. 05/26/17 on evaluation today patient presents for evaluation concerning her bilateral lower extremity ulcer secondary to lymphedema. Fortunately she appears to be doing very well at this point in time. Specifically her right leg appears to be completely closed and healed. The left leg though there is still [name] small area of opening overall has healed. She does have [name] little bit of excoriation at the top or the wraps are because they slid down on her. Otherwise there's no evidence of infection and the other has improved dramatically now that she is not draining as significantly. Patient is having no significant pain she tells me she has been able to use her lymphedema pumps one time [name] day although that's not all she can tolerate. Even that is difficult for her. 06/02/17 on evaluation today patient's ulcers appear to be completely closed at this point. She does still note some odor but I believe this is due to the fact she has not been able to wash her legs as well currently. Fortunately she did bring her compression with her today. She does have the EXTREMIT-EASE Compression. With that being said she is somewhat nervous about going back to this as previously she  broke out yet  again once we discontinue wrapping her. READMISSION 11/23/17; patient is now 75 year old diabetic woman with severe chronic venous insufficiency with lymphedema. We care for her last in this clinic in February 2019 at which time she had chronic venous insufficiency with lymphedema bilateral lower extremity ulcers. We discharged her with bilateral wrap around/extremitease stockings and compression pumps. Is fairly clear she is not using the compression pumps stating it causes pain in her knees. In any case she was admitted to hospital from 8/3 through 8/6 with [name] several week history of recurrent bilateral ulcers. She was felt to have bilateral cellulitis treated with doxycycline IV. She is still on oral doxycycline and has another 2 days worth. At discharge her white count was 14.1 hemoglobin at 8.9. The patient is [name] diabetic however her last ABIs in 2018 showed [name] right ABI of 1.21, left of 1.35. TBI's were 0.76 on the right 0.75 on the left. Posterior tibial artery was monophasic on the right triphasic on the left. Dorsalis pedis triphasic on the right and triphasic on the left. She is not felt to have significant macrovascular disease 12/02/17;significant bilateral lower extremity wounds secondary to severe uncontrolled lymphedema. She did not get home health out to change the wraps. So she kept this on all week. She is telling me she using his her compression pumps once [name] week. In general all the wounds look better. We've been using silver alginate with secondary absorptive dressings 4 layer compression 12/10/17; bilateral lower extremity wounds secondary to uncontrolled lymphedema. She tells me she is using her compression pumps on most days. We've been keeping wraps on all week. Using silver alginate. She has wounds on the right lateral calf left medial and left lateral calf. All of this is somewhat better 12/16/17; the patient has 3 small open areas. One on the right lateral calf, one on the left  medial calf and the small area on the left lateral calf. She has lymphedema, chronic venous insufficiency with hemosiderin deposition. Very fibrotic distal lower extremity scan with suggestion of an inverted bottle sign appearance to her legs. She has compression pumps but she does not use them has not used them in the last week. Currently we have her in 4 layer compression, silver alginate to the wounds and this is being changed once by home health. She is noncompliant with the compression pumps because she says it hurts her knees. She also has compression stockings ando Juxta light stockings at home 12/24/17; the patient's right lateral calf is healed as well as the left medial. There is no open wound on the right leg. She still has 2 areas on the left lateral calf. We transitioned the right leg into an extremitease stocking which she brought in from home. 12/31/17; the patient has [name] superficial wound on the left lateral lower calf. We've been wrapping this leg. She arrives today with the right leg swollen but without [name] wound. She cannot get her extremities stocking on the right leg. There is [name] lot more edema here. She is not using her compression pumps 01/10/2018; patient has [name] same superficial wound on the left lateral calf. Her right leg has less peripheral edema. She tells me she also started herself on some Lasix that had been previously prescribed for her at some point but she had never taken it. She is also concerned about whether her compression pumps are leaking. She has extremitease stockings however the edema gets bad enough she can get these on. Currently  she has no open wound on the right leg and is mention the edema is actually better than when I saw this 10 days ago 01/17/2018; patient has [name] same superficial area on the posterior left calf and [name] weeping area in the mid part of the right lateral calf. She still does not have anyone from [facility] coming to see her compression  pumps which she describes as being suspicious for [name] leakage. I am not really sure how frequently she is using this in any case. She has massive bilateral calf lymphedema. I think there is some spreading lymphedema into her posterior thighs. 01/24/2018; the patient's edema in her legs is actually some better. She still has [name] smaller area on the left posterior calf although this is better. The weeping area on the right lateral calf is also better. As it turned out she did not get her pumps from [facility] but [facility] and they are going to try to go out and see these this week which is going to help. I explained to her that she will use the compression pumps once [name] day while we are wrapping her legs but when she transitions to stockings that may be she will require pumping twice [name] day. Her compliance in this area has not been high and I wonder about her ability to do this herself. 02/01/2018; the area on the posterior left leg is healed. She still has the original wound on the right lateral calf which is epithelialized. The however just above this there is still weeping lymphedema fluid. She has [name] new area on the upper right calf which is either wrap injury or is she points out where she has been scratching under the wraps. We still not have managed to get [name] hold of [name] representative of [facility] to see if the pump is operational. She has extremitease stockings at home however I am not of the opinion that this will maintain her skin integrity without the compression pumps. 02/08/2018; posterior left leg remains healed although she has [name] new small open area on the left lateral calf. Right lateral calf has two quarter sized open areas and close juxtaposition. There is less weeping edema fluid. She apparently managed to contact [facility]. They are sending her [name] part. But she is still not received it 02/15/2018; the patient has no open wound on either leg. Some scale present on  the right upper lateral calf just below the fibular head. Edema control is excellent. She did not bring her stockings or her juxta lite wrap around. She has the part for her compression pump but she has not use the compression pumps yet READMISSION 01/10/2019 Patient is now [name] 75 year old woman that we have had in the clinic multiple times in the past. She has chronic lower extremity lymphedema, recurrent wounds on her bilateral lower extremities. She is also [name] type II diabetic reasonably well controlled with [name] recent hemoglobin A1c while she was in the hospital of 5.9. She has wounds on her bilateral lower extremities mostly on the left lateral and right lateral. These were noted also during her recent hospitalization. The patient states they have been there for [name] while. She has not been able to wear her stockings he has trouble getting them on as she lives alone. She also has external compression pumps but uses them sparingly because it causes pain in her knees which hurt because of osteoarthritis. The patient was on doxycycline before she went into the hospital apparently  for fear of infection in her legs and is on Keflex coming out because of [name] UTI The patient was recently in the hospital from 01/06/2019 through 01/09/2019. She was admitted with nausea and vomiting and prerenal azotemia. This was corrected with IV fluid her Lasix has been put on hold. Hemoglobin A1c at 5.9 her metformin was discontinued. She was also felt to have [name] UTI she was prescribed Keflex at 503 times [name] day she is picking that up today. The patient is not felt to have an arterial issue. ABIs in our clinic were 1.24 on the right and 1.06 on the left. She had formal studies in 2018 at which time her ABIs were 1.21 on the right TBI of 0.76 and on the left 1.35 and 0.75 respectively. Waveforms are on the right were monophasic and biphasic, triphasic and biphasic on the left. She is tolerated 4 layer compression in the past 10/13.  Comes in today having not used her compression pumps /perhaps once in the last week. She has too much edema to consider healing these wounds. We have been using silver alginate 10/20; she tells me he had she used her compression pumps once [name] day as we asked. Clearly she has edema out of her legs although most of it seems to be settling around her knees which is what she complains about. We have been using silver alginate to the extensive de-epithelialized area on the left lateral calf and [name] much smaller area on the right lateral extending linearly into the right posterior 10/27; she is using her compression pumps daily. Her edema control is better. We have been using silver alginate to the extensive wound areas on her bilateral lower legs. This includes left anterior left lateral and left posterior. Right anterior right lateral and right posterior. The wounds are not all in the same condition. She has severe bilateral skin damage with the epithelialization, flaking dried epithelium 11/3; she is using her compression pumps 1 times daily. Pretty obvious that her wraps fell down especially on the left leg to mid calf. I think things are improving on the right lateral calf, I do not see anything open posteriorly. The major areas on the left lateral where she has 3 or 4 deeper wounds in the midst of an entire de-epithelialized area. There is erythema here but no tenderness I think this represents venous stasis rather than cellulitis. I debrided the areas on the left lateral calf last week but once again they have tightly adherent debris over the top of them which is disappointing 11/10; the patient's major areas on the left lateral calf to 3 wounds with some depth surrounded by [name] large area of de-epithelialized tissue. There is no infection. We have been using silver alginate under 4-layer compression. On the right lateral she has [name] more contained wound area that is superficial. Been using silver alginate  under 4-layer compression 11/17; the patient's major wound is on the left lateral calf. We have been using silver alginate under compression. There is no open wound per se on the right but it de-epithelialized area on the lateral calf that is weeping edema fluid. I do not see much change here today. 12/1; patient arrives in clinic with her wounds looking better. Her edema control is better. The lymphedema specialist working on the left leg we have been working on the right. We have been using 4-layer compression. The patient is using [name] consider external compression pumps and Hydrofera Blue to the open areas. She is  being measured for an abdominal compression pump and apparently that is going to go through her insurance 12/15; continued improvement. 2 small areas on the left and slightly larger 2 small areas on the right. 12/22; patient still has open areas on the right lateral calf which are superficial and the new one on the right anterior calf. She did not use her compression pumps this week. Her left leg is wrapped by our lymphedema specialist. There are no open wounds here 12/29; she still has [name] superficial area on the right lateral calf and area on the right medial calf. Right anterior wound area appears to have closed. She has much better edema control today than last week 04/11/2019; the patient is here with severe bilateral lymphedema, chronic venous insufficiency with marked skin changes in her lower extremities. We are wrapping her right leg and her lymphedema specialist is putting lymphedema wraps on the left. She states she is using her compression pumps although in the past her compliance with uses been limited because of osteoarthritic pain in her knees. She only has one remaining area on the right leg however she has wrap injuries on the left at the upper level what where I think her wraps were cause. the left lateral area left posterior. These would be new this week 1/12; generally  better. The wrap injuries in the upper calf on the left from last week are down to [name] single wound. She has 2 wounds anteriorly that are still weeping fluid although these appear to have been epithelialized surface for the most part. There is no open wounds on the right leg we are wrapping her right leg and 4-layer compression and we are going [name] lymphedema wrap on the left leg. The the approach is [name] Farrow 2000 wrap on the left leg and then subsequently the right leg. She claims to be compliant with her compression pumps 1/19; the patient has 1 tiny open area on the right medial calf. The rest of this is not open. She however has [name] large erythematous denuded area on the left anterior tibia extending laterally. There is no evidence of infection here. She has her new current waist high compression pumps 1/26; the patient has no open wounds on the right. On the left anteriorly much better than last week still [name] smattering of small weeping open areas. There is no evidence of infection 2/2; still no open wounds on the right leg. He still does not have [name] external compression garment. She arrives in clinic today with an open area on the plantar aspect of the left second toe at the base of the PIP. This apparently was painful last week so it was not wrapped by our lymphedema specialist. There was no open wound at the time. The patient denies traumatizing the toe however today she arrives with [name] small but deep wound in this area the dorsal surface of the toe is very tender. 2/11; she arrives in clinic today with everything epithelialized. She was that way last week on the right but we have still been wrapping. She has been using lymphedema wraps on the left. On careful inspection of the left leg she still has areas that look vulnerable but no clearly open wound there may be some weeping through cracks in the skin. She is using above the waist external compression pumps and she has [name] separate external  compression garment for her thighs. The plan is to discharge her for follow-up in lymphedema clinic with Juliann Pulse our lymphedema specialist.  In the past healing this woman has resulted in relatively quick breakdown however she generally seems more motivated example willing to use her external compression pumps etc. READMISSION 02/13/2020. Mrs. Hasper is [name] now 75 year old woman who has bilateral lower extremity lymphedema and [name] history of difficult to treat bilateral lower extremity wounds. When she was here last time we had her seen by [name] lymphedema specialist. She had lower extremity compression stockings as well as compression garments for her thighs. She has compression pumps as well. Unfortunately she has not been doing well. She describes unrelenting nausea with vomiting anytime she tries to eat or drink or take her medications. She was seen in urgent care on 10/26. Notably they found her to be somewhat dehydrated. Gave her [name] liter of fluid. Her creatinine was up to 2.42. The rest of her lab work including liver function tests, white count, hemoglobin were all normal. She did have [name] urine for culture that showed Klebsiella she was prescribed ciprofloxacin but I seriously doubt that she is able to keep this down by her description. She has not taken any of her meds in over [name] month. She states she feels too weak to take care of her lower extremities with compression garments and her external compression pumps she has not been using any of these. As [name] result she has 2 large areas on the left lateral calf one anteriorly [name] cluster of 3 on the right lateral and [name] small area on her right ankle. All of these are superficial. She has poorly controlled lymphedema especially on the anterior tibial areas however by my my anxiety she actually looks like she has less swelling than what I remember. Past medical history includes type 2 diabetes with [name] recent hemoglobin A1c of 6.8 chronic venous insufficiency,  lymphedema which she was managing with above the waist compression and compression pumps, diastolic congestive heart failure. Nausea and vomiting as described above. We did not repeat her arterial studies today however these were quite normal when they were last done in 2018 with an ABI of 1.21 on the right and 1.35 on the left. TBI's were within normal limits at 0.76 and 0.75 she had triphasic waveforms on the left monophasic waveforms at the posterior tibial but triphasic waveforms at the dorsalis pedis. She has previously tolerated 4 layer compression 11/18; the patient has [name] cluster of wounds on the right lateral lower calf and areas on the left lateral calf. We have been using silver alginate under compression she seems to be tolerating this well. Her edema is improved. HOWEVER she arrives in clinic today still with [name] description of bilious vomiting that is making it impossible for her to eat and drink. Her vitals are stable however she looks more gaunt. I have tried to talk her into going to the emergency room to be checked out however she will not hear anything of it 11/29; the patient's right leg is closed. On the left leg she has 2 small superficial open areas anteriorly and one just to the lateral part of the tibia. These are just about closed. She did go to the ER with regards to her persistent nausea and vomiting. Apparently they did not find anything and did [name] CT scan of her abdomen although I have not yet had [name] chance to review these records Readmission: 10/16/2020 upon evaluation today patient presents for reevaluation here in the clinic due to issues that she is can have one of the bilateral lower extremities. More importantly she  has an issue which looks to be more of [name] friction injury to be honest in the posterior upper thigh on the left. Again this appears to potentially be where [name] brief could have been pulled [name] little bit causing [name] friction injury. Again it does not look like [name]  pressure injury at all very superficial not really draining much at all. Fortunately there does not appear to be any signs of active infection at this time which is great news. No fevers, chills, nausea, vomiting, or diarrhea. Patient does have [name] history of bilateral lower extremity lymphedema, hypertension, congestive heart failure, and long-term use of anticoagulant therapy. She is currently on Eliquis. Unfortunately since we last seen her she apparently ended up apparently fallen in her home and she was subsequently found on the ground by her daughter admitted to the hospital for altered mental status and weakness she was found to have [name] UTI and Warnicke's encephalopathy. Upon discharge mental status had improved she had fevers which were thought to be potentially due to pseudogout flare. She did improve with steroids. She also had an EGD which showed reflux esophagitis and multiple large and small nonbleeding duodenal ulcers. She was discharged on 04/08/2020 to [name] for [facility] she has been there since. It was her daughter who actually found her as she was not able to get in touch with her mom. For that reason she called the police for [name] safety check and found her lying on the floor covered with stool and bugs crawling all over her. She was seen by her PCP for intractable nausea and vomiting 4 to 6 weeks prior. She was found to have profound weakness which honestly has not really improved much at this point. Her CT scan was negative. 8/31; the patient returns to clinic now at [name] local nursing home I believe on [place]. It sounds as though she is been discharged for rehab and is now [name] chronic care patient in the facility. 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 She is cognitively intact. She comes in the clinic with the wound on her left medial upper thigh. She has kerlix Coban wraps on her bilateral lower extremities and [name] vague history of  wound on her buttock. Supposed to be using Xeroform according to [facility] last notes from July although she came in with nothing on the thigh wound today Objective Constitutional Sitting or standing Blood Pressure is within target range for patient.. Pulse regular and within target range for patient.Marland Kitchen Respirations regular, non-labored and within target range.. Temperature is normal and within the target range for the patient.Marland Kitchen Appears in no distress. Vitals Time Taken: 9:30 AM, Temperature: 97.4 F, Pulse: 106 bpm, Respiratory Rate: 17 breaths/min, Blood Pressure: 138/73 mmHg. General Notes: Wound exam; we did [name] inspection of this patient's buttocks lower legs and were unable to identify any additional wounds. On the area on the right medial thigh she has 2 wounds in close juxtaposition. Totally covered and surrounded by dried thick eschar and slough. I painstakingly remove this with [name] #5 curette the surfaces of the wounds do not look particularly ominous. No surrounding soft tissue involvement. She does not have wounds on her lower legs although she does have changes of chronic lymphedema and venous insufficiency. She has [name] healed wound on the left buttock no open wounds here either. Integumentary (Hair, Skin) Wound #47 status is Open. Original cause of wound was Gradually Appeared. The date acquired was:  10/16/2020. The wound has been in treatment 7 weeks. The wound is located on the Right,Posterior Upper Leg. The wound measures 9.5cm length x 3cm width x 0.1cm depth; 22.384cm^2 area and 2.238cm^3 volume. There is Fat Layer (Subcutaneous Tissue) exposed. There is no tunneling or undermining noted. There is [name] medium amount of serosanguineous drainage noted. The wound margin is distinct with the outline attached to the wound base. There is large (67-100%) red granulation within the wound bed. There is [name] small (1-33%) amount of necrotic tissue within the wound bed. Assessment Active  Problems ICD-10 Lymphedema, not elsewhere classified Other specified soft tissue disorders Non-pressure chronic ulcer of left thigh with fat layer exposed Essential (primary) hypertension Chronic combined systolic (congestive) and diastolic (congestive) heart failure Long term (current) use of anticoagulants Procedures Wound #47 Pre-procedure diagnosis of Wound #47 is an Abrasion located on the Right,Posterior Upper Leg . There was [name] Selective/Open Wound Non-Viable Tissue Debridement with [name] total area of 28.5 sq cm performed by Ricard Dillon., MD. With the following instrument(s): Curette to remove Viable and Non-Viable tissue/material. Material removed includes Eschar and Slough and after achieving pain control using Lidocaine. No specimens were taken. [name] time out was conducted at 09:45, prior to the start of the procedure. [name] Minimum amount of bleeding was controlled with Pressure. The procedure was tolerated well with [name] pain level of 0 throughout and [name] pain level of 0 following the procedure. Post Debridement Measurements: 9.5cm length x 3cm width x 0.1cm depth; 2.238cm^3 volume. Character of Wound/Ulcer Post Debridement is improved. Post procedure Diagnosis Wound #47: Same as Pre-Procedure Plan Follow-up Appointments: Return appointment in 1 month. - ***75 minutes [facility] life**** Dr. Dellia Nims Other: - Patient resides at [facility]/ Shower/ Hygiene: May shower and wash wound with soap and water. Edema Control - Lymphedema / SCD / Other: Elevate legs to the level of the heart or above for 30 minutes daily and/or when sitting, [name] frequency of: Moisturize legs daily. Other Edema Control Orders/Instructions: - Apply Kerlix/Coban 2 layer wrap to bilateral legs, change 2x week.4 [facility] can order compression stockings 20- 74m/Hg and pt. can wear those instead of the kerlix/coban wrap.e Off-Loading: Other: - Try to minimize friction/shearing to area Additional Orders /  Instructions: Follow Nutritious Diet WOUND #47: - Upper Leg Wound Laterality: Right, Posterior Cleanser: Soap and Water 3 x Per Week/30 Days Discharge Instructions: May shower and wash wound with dial antibacterial soap and water prior to dressing change. Cleanser: Wound Cleanser 3 x Per Week/30 Days Discharge Instructions: Cleanse the wound with wound cleanser prior to applying [name] clean dressing using gauze sponges, not tissue or cotton balls. Prim Dressing: Xeroform Occlusive Gauze Dressing, 4x4 in 3 x Per Week/30 Days ary Discharge Instructions: Apply to wound bed as instructed Secondary Dressing: Zetuvit Plus Silicone Border Dressing 4x4 (in/in) 3 x Per Week/30 Days Discharge Instructions: Or equivalent bordered foam 1. The patient does not have any wounds on her lower legs chronic venous insufficiency and lymphedema they are using kerlix and Coban at the facility apparently changing this daily. They might be able to get away with bilateral 20/30 lower extremity stockings as an alternative to this and might save the staff some time 2. She does not have anything open on her buttock although we had the [facility] her into one of our stretchers to get her the ability to turn over for [place] look at this. She is however at high risk for pressure ulcer recurrence in this  area 3. The only other area is on the right posterior medial thigh. They did not come in with any dressing on this area. The area looks dry but otherwise superficial. Rims of epithelialization no evidence of surrounding infection. Painstaking removal of surrounding eschar predominantly some dry skin and minimal amount of slough. Looking at these I think the wound should heal with Xeroform or at least some dressing to keep these moist 4. I had [name] quick look at her discharge summary which lists that her is having Wernicke's encephalopathy. Fortunately for her her cognition seems totally normal especially her Environmental health practitioner) Signed: 12/04/2020 4:28:25 PM By: Linton Ham MD Entered By: Linton Ham on 12/04/2020 10:00:39 -------------------------------------------------------------------------------- SuperBill Details Patient Name: Date of Service: [name] [name], [name] [name] 12/04/2020 Medical Record Number: [id number] Patient Account Number: [id number] Date of Birth/Sex: Treating RN: 01-Sep-1945 (75 y.o. [name] Primary Care Provider: [name] Other Clinician: Referring Provider: Treating Provider/Extender: [name] in Treatment: 7 Diagnosis Coding ICD-10 Codes Code Description I89.0 Lymphedema, not elsewhere classified M79.89 Other specified soft tissue disorders L97.122 Non-pressure chronic ulcer of left thigh with fat layer exposed I10 Essential (primary) hypertension I50.42 Chronic combined systolic (congestive) and diastolic (congestive) heart failure Z79.01 Long term (current) use of anticoagulants Facility Procedures CPT4 Code: [id number] IC L Description: [id number] - DEBRIDE WOUND 1ST 20 SQ CM OR < D-10 Diagnosis Description 97.122 Non-pressure chronic ulcer of left thigh with fat layer exposed Modifier: Quantity: 1 CPT4 Code: [id number] IC L Description: 58 - DEBRIDE WOUND EA ADDL 20 SQ CM 1 D-10 Diagnosis Description 97.122 Non-pressure chronic ulcer of left thigh with fat layer exposed Modifier: Quantity: Physician Procedures : CPT4 Code Description Modifier [id number] - WC PHYS DEBR WO ANESTH 20 SQ CM ICD-10 Diagnosis Description L97.122 Non-pressure chronic ulcer of left thigh with fat layer exposed Quantity: 1 : [id number] - WC PHYS DEBR WO ANESTH EA ADD 20 CM ICD-10 Diagnosis Description [id number] Non-pressure chronic ulcer of left thigh with fat layer exposed Quantity: 1 Electronic Signature(s) Signed: 12/04/2020 4:28:25 PM By: Linton Ham MD Signed: 12/05/2020 4:23:50 PM By: Rhae Hammock RN Entered By: Rhae Hammock on 12/04/2020 10:20:30

## 2020-12-06 ENCOUNTER — Ambulatory Visit (INDEPENDENT_AMBULATORY_CARE_PROVIDER_SITE_OTHER): Payer: PPO | Admitting: *Deleted

## 2020-12-06 DIAGNOSIS — E119 Type 2 diabetes mellitus without complications: Secondary | ICD-10-CM

## 2020-12-06 DIAGNOSIS — R5381 Other malaise: Secondary | ICD-10-CM

## 2020-12-06 DIAGNOSIS — R531 Weakness: Secondary | ICD-10-CM

## 2020-12-06 NOTE — Patient Instructions (Signed)
Visit Information  PATIENT GOALS:  Goals Addressed   None     The patient verbalized understanding of instructions, educational materials, and care plan provided today and declined offer to receive copy of patient instructions, educational materials, and care plan.   Telephone follow up appointment with care management team member scheduled for:12/13/20  Eduard Clos MSW, LCSW Licensed Clinical Social Worker [place] [phone]

## 2020-12-06 NOTE — Chronic Care Management (AMB) (Signed)
Chronic Care Management    Clinical Social Work Note  12/06/2020 Name: [name] [name] MRN: [id number] DOB: 1946/03/12  [name] [name] is a 75 y.o. year old female who is a primary care patient of [name] Koch, MD. The CCM team was consulted to assist the patient with chronic disease management and/or care coordination needs related to: Level of Care Concerns.   Engaged with patient by telephone for follow up visit in response to provider referral for social work chronic care management and care coordination services.   Consent to Services:  The patient was given information about Chronic Care Management services, agreed to services, and gave verbal consent prior to initiation of services.  Please see initial visit note for detailed documentation.   Patient agreed to services and consent obtained.   Assessment: Review of patient past medical history, allergies, medications, and health status, including review of relevant consultants reports was performed today as part of a comprehensive evaluation and provision of chronic care management and care coordination services.     SDOH (Social Determinants of Health) assessments and interventions performed:    Advanced Directives Status: Not addressed in this encounter.  CCM Care Plan  Allergies  Allergen Reactions   Oxycodone Hcl Nausea And Vomiting   Penicillins Itching    Has patient had a PCN reaction causing immediate rash, facial/tongue/throat swelling, SOB or lightheadedness with hypotension: No Has patient had a PCN reaction causing severe rash involving mucus membranes or skin necrosis: No Has patient had a PCN reaction that required hospitalization: No Has patient had a PCN reaction occurring within the last 10 years: No If all of the above answers are "NO", then may proceed with Cephalosporin use.    Outpatient Encounter Medications as of 12/06/2020  Medication Sig Note   Albuterol Sulfate (PROAIR RESPICLICK) 450 (90  Base) MCG/ACT AEPB Inhale into the lungs. 2 puffs every 6 hours as needed    apixaban (ELIQUIS) 2.5 MG TABS tablet Take by mouth 2 (two) times daily.    collagenase (SANTYL) ointment Apply topically daily.    dextromethorphan-guaiFENesin (MUCINEX DM) 30-600 MG 12hr tablet Take 1 tablet by mouth 2 (two) times daily.    diclofenac Sodium (VOLTAREN) 1 % GEL Apply topically 4 (four) times daily.    feeding supplement, ENSURE COMPLETE, (ENSURE COMPLETE) LIQD Take 237 mLs by mouth 2 (two) times daily between meals.    folic acid (FOLVITE) 1 MG tablet Take 1 mg by mouth daily.    metFORMIN (GLUCOPHAGE) 500 MG tablet Take 1 tablet (500 mg total) by mouth daily with breakfast. 02/23/2020: LF 11/14   metoCLOPramide (REGLAN) 5 MG tablet Take 1 tablet (5 mg total) by mouth 3 (three) times daily as needed for nausea. (Patient not taking: No sig reported)    METOPROLOL TARTRATE PO Take 25 mg by mouth daily.    montelukast (SINGULAIR) 10 MG tablet Take 1 tablet (10 mg total) by mouth at bedtime. 02/23/2020: LF 10/18, 90 day supply    Multiple Vitamin (MULTIVITAMIN WITH MINERALS) TABS tablet Take 1 tablet by mouth daily.    nutrition supplement, JUVEN, (JUVEN) PACK Take 1 packet by mouth 2 (two) times daily between meals.    ondansetron (ZOFRAN ODT) 4 MG disintegrating tablet Take 1 tablet (4 mg total) by mouth every 8 (eight) hours as needed for nausea or vomiting.    pantoprazole (PROTONIX) 40 MG tablet TAKE 1 TABLET BY MOUTH EVERY DAY (Patient not taking: No sig reported)    promethazine (PHENERGAN) 25 MG  suppository Place 1 suppository (25 mg total) rectally every 6 (six) hours as needed for nausea or vomiting.    sucralfate (CARAFATE) 1 GM/10ML suspension Take 10 mLs (1 g total) by mouth 4 (four) times daily -  with meals and at bedtime.    thiamine 100 MG tablet Take 1 tablet (100 mg total) by mouth daily.    No facility-administered encounter medications on file as of 12/06/2020.    Patient Active Problem  List   Diagnosis Date Noted   Polyarthralgia    Wernicke's aphasia    Bilateral leg ulcer, limited to breakdown of skin ([place]) 01/16/2020   Allergic rhinitis 07/28/2019   Postnasal drip 11/16/2017   Sensorineural hearing loss (SNHL), bilateral 11/16/2017   Tinnitus of both ears 11/16/2017   Obesity, Class II, BMI 35-39.9, isolated (see actual BMI) 11/08/2017   CKD (chronic kidney disease), stage III ([place]) 11/08/2017   Anemia 11/07/2017   Thrombocytosis 11/07/2017   Lymphedema 11/07/2017   AKI (acute kidney injury) ([place]) 10/16/2017   Routine general medical examination at a health care facility 05/10/2014   Paroxysmal SVT (supraventricular tachycardia) ([place]) 11/28/2013   Physical deconditioning 11/23/2013   Generalized weakness 11/17/2013   Multilevel spine pain 11/17/2013   Aortic stenosis, mild 11/17/2013   DM type 2 (diabetes mellitus, type 2) ([place]) 02/02/2013   Arthritis 10/19/2012   Mixed incontinence 10/19/2012   GERD (gastroesophageal reflux disease) 04/20/2011   HTN (hypertension) 01/20/2011   Morbid obesity ([place])     Conditions to be addressed/monitored:  level of care needs ; Limited social support, Level of care concerns, Limited access to caregiver, and Inability to perform ADL's independently  Care Plan : [place] of Care  Updates made by Deirdre Peer, LCSW since 12/06/2020 12:00 AM     Problem: Emotional Distress related to being "stuck at facility and want to get out"   Priority: High     Long-Range Goal: Emotional Health Supported   Start Date: 11/26/2020  Expected End Date: 01/03/2021  This Visit's Progress: On track  Recent Progress: Not on track  Priority: High  Note:   Current barriers:   Unable to independently navigate with SNF staff for her goal to return home Currently unable to  independently self manage needs related to chronic health conditions.  Knowledge Deficits related to short term plan for care coordination needs and long term plans  for chronic disease management needs Clinical Goals: explore community resource options for unmet needs related to:Inability to perform ADL's independently and Facility placement (wanting to leave SNF and return home) and Financial constraints  patient will follow up with collecting and completing Medicaid application, request meeting with SNF staff to discuss her wishes/goals as directed by SW Interventions:  Returned outreach call from pt. She continues to be unhappy at SNF; states "no one is listening to me". She reports a Psychiatrist was sent in to see me yesterday and I told them I was in pain... they asked if I was depressed and I am not and don't want to be put in that category".  CSW validated her feelings and concerns. Pt shared they suggested Tramadol for her pain and advised her it helps with depression also. CSW advised pt she will have to discuss this with the SNF Provider and team.  CSW reminded pt of previous suggestion made by CSW to request the SNF SW come to speak with her about her concerns and wished- pt also aware of her rights to call the [facility]  which she indicates she has done in past.  Pt has # for [employer].   CSW spoke with pt who advised she has been at [facility] for 8 months. She is not happy; wants to return home. CSW inquired about her level of ability to return home- pt reports she needs more therapy but SNF is not offering it. She reports her insurance ran out and she is now being billed for her stay as well as working to complete a Medicaid application for determination. Pt wants to return home and states the SNF staff are ignoring her and not pursuing her wishes.  CSW offered to call SNF to seek additional information but she declined.  CSW encouraged pt to request a meeting with the staff at SNF and her family to discuss her wishes/goals of returning home. Pt does not want her family coming; as they "may show out". CSW encouraged pt to request they meet with her and if this is  not met to consider calling the [employer]; which she states she has already done and has their #.  It is unclear if pt would be safe, independent enough to be alone, etc. CSW has advised pt if she gets Medicaid approved it does not mean she will have around the clock care- at best; a few hours 5 days/week through the [facility] program.   Assessment of needs, barriers , as well as how impacting  Review various resources and discussed options  ( Request meeting with the SNF staff, call [employer] if remains dis-satisfied, hire help at home, ) Collaboration with PCP regarding development and update of comprehensive plan of care as evidenced by provider attestation and co-signature Inter-disciplinary care team collaboration (see longitudinal plan of care) Patient interviewed and appropriate assessments performed Provided patient with information about Medicaid, private care at home, [employer] support Discussed plans with patient for ongoing care management follow up and provided patient with direct contact information for care management team Advised patient to - speak to staff at [facility] and request a discharge planning meeting Provided education to patient/caregiver regarding level of care options. Other interventions provided: Solution-Focused Strategies, Active listening / Reflection utilized , Emotional Supportive Provided, Problem Solving /Task Center , Increase in actives / exercise encouraged , and Verbalization of feelings encouraged  Patient self-care activities: Over the next 7 days - develop a personal safety plan - exercise at least 2 to 3 times per week - spend time or talk with others at least 2 to 3 times per week - start or continue a personal journal - talk about feelings with a friend, family or spiritual advisor - practice positive thinking and self-talk - speak to staff at [facility] and request a discharge planning meeting -contact the [employer] if needed as discussed -collect and  complete Medicaid application - start or continue a personal journal - talk about feelings with a friend, family or spiritual advisor - practice positive thinking and self-talk   Follow Up Plan: CSW to call pt next week SW will follow up with patient by phone over the next 7 days        Follow Up Plan: Appointment scheduled for SW follow up with client by phone on: 9/ 9/22      Eduard Clos MSW, [place] Licensed Clinical Social Worker [place] [phone]

## 2020-12-13 ENCOUNTER — Ambulatory Visit: Payer: PPO | Admitting: *Deleted

## 2020-12-13 DIAGNOSIS — E119 Type 2 diabetes mellitus without complications: Secondary | ICD-10-CM

## 2020-12-13 DIAGNOSIS — R531 Weakness: Secondary | ICD-10-CM

## 2020-12-13 DIAGNOSIS — R5381 Other malaise: Secondary | ICD-10-CM

## 2020-12-14 NOTE — Chronic Care Management (AMB) (Signed)
CSW spoke with pt who reports she is still at [facility] and has not made contact with the staff to inquire and discuss her concerns and wished. She does report talking with someone through the [employer]/State dept.  CSW reminded pt she has to take the steps to request the SNF move her or release her to home. She is also reminded that the [employer] can take any particular complaints.  Pt shared that a [employer] rep came to see her this week.  CSW alerted pt to plans to sign off as she has been provided with the resources and information needed for her to take action on her concerns and wishes.  CSW provided pt with contact # to call if needs arise.   Eduard Clos MSW, LCSW Licensed Clinical Social Worker [place] [phone]

## 2020-12-14 NOTE — Patient Instructions (Signed)
Visit Information  PATIENT GOALS:  Goals Addressed               This Visit's Progress     COMPLETED: Provide support and guidance related to pt's desire to be home (pt-stated)        Timeframe:  Long-Range Goal Priority:  High Start Date:       11/26/20                      Expected End Date:   02/02/21                    Follow Up Date 12/06/20  - speak to staff at [facility] and request a discharge planning meeting -contact the [employer] if needed as discussed -collect and complete Medicaid application - start or continue a personal journal - talk about feelings with a friend, family or spiritual advisor - practice positive thinking and self-talk    Why is this important?   When you are stressed, down or upset, your body reacts too.  For example, your blood pressure may get higher; you may have a headache or stomachache.  When your emotions get the best of you, your body's ability to fight off cold and flu gets weak.  These steps will help you manage your emotions.     Notes:         The patient verbalized understanding of instructions, educational materials, and care plan provided today and declined offer to receive copy of patient instructions, educational materials, and care plan.   No further follow up required:    Eduard Clos MSW, LCSW Licensed Clinical Social Worker [place] [phone]

## 2020-12-19 DIAGNOSIS — F0390 Unspecified dementia without behavioral disturbance: Secondary | ICD-10-CM | POA: Diagnosis not present

## 2020-12-19 DIAGNOSIS — Z9119 Patient's noncompliance with other medical treatment and regimen: Secondary | ICD-10-CM | POA: Diagnosis not present

## 2020-12-19 DIAGNOSIS — M25562 Pain in left knee: Secondary | ICD-10-CM | POA: Diagnosis not present

## 2020-12-19 DIAGNOSIS — M25561 Pain in right knee: Secondary | ICD-10-CM | POA: Diagnosis not present

## 2020-12-26 DIAGNOSIS — F331 Major depressive disorder, recurrent, moderate: Secondary | ICD-10-CM | POA: Diagnosis not present

## 2020-12-26 DIAGNOSIS — R419 Unspecified symptoms and signs involving cognitive functions and awareness: Secondary | ICD-10-CM | POA: Diagnosis not present

## 2021-01-01 ENCOUNTER — Other Ambulatory Visit: Payer: Self-pay

## 2021-01-01 ENCOUNTER — Encounter (HOSPITAL_BASED_OUTPATIENT_CLINIC_OR_DEPARTMENT_OTHER): Payer: PPO | Attending: Internal Medicine | Admitting: Internal Medicine

## 2021-01-01 DIAGNOSIS — L97122 Non-pressure chronic ulcer of left thigh with fat layer exposed: Secondary | ICD-10-CM | POA: Diagnosis not present

## 2021-01-01 DIAGNOSIS — I890 Lymphedema, not elsewhere classified: Secondary | ICD-10-CM | POA: Insufficient documentation

## 2021-01-01 DIAGNOSIS — I5042 Chronic combined systolic (congestive) and diastolic (congestive) heart failure: Secondary | ICD-10-CM | POA: Diagnosis not present

## 2021-01-01 DIAGNOSIS — E1151 Type 2 diabetes mellitus with diabetic peripheral angiopathy without gangrene: Secondary | ICD-10-CM | POA: Insufficient documentation

## 2021-01-01 DIAGNOSIS — I872 Venous insufficiency (chronic) (peripheral): Secondary | ICD-10-CM | POA: Diagnosis not present

## 2021-01-01 DIAGNOSIS — I110 Hypertensive heart disease with heart failure: Secondary | ICD-10-CM | POA: Diagnosis not present

## 2021-01-01 DIAGNOSIS — E11622 Type 2 diabetes mellitus with other skin ulcer: Secondary | ICD-10-CM | POA: Insufficient documentation

## 2021-01-01 DIAGNOSIS — E1140 Type 2 diabetes mellitus with diabetic neuropathy, unspecified: Secondary | ICD-10-CM | POA: Insufficient documentation

## 2021-01-01 DIAGNOSIS — Z7901 Long term (current) use of anticoagulants: Secondary | ICD-10-CM | POA: Diagnosis not present

## 2021-01-01 NOTE — Progress Notes (Signed)
GARNETT, REKOWSKI ([id number]) Visit Report for 01/01/2021 HPI Details Patient Name: Date of Service: [name] [name], [name] [name] 01/01/2021 9:30 [name] M Medical Record Number: [id number] Patient Account Number: [id number] Date of Birth/Sex: Treating RN: [id number] (75 y.o. [name] Primary Care Provider: [name] Other Clinician: Referring Provider: Treating Provider/Extender: [name] in Treatment: 11 History of Present Illness HPI Description: 04/09/15; the patient is here for review of wounds on her bilateral lower extremities for the last month. The patient has [name] history of lymphedema and bilateral chronic venous insufficiency with inflammation she was here for review of wounds on her bilateral legs in 2014 she was discharged home with external compression pumps which she is currently not using. Over the last month she developed wounds on her bilateral lower legs with drainage and pain and she is here for our review of this. 04/16/15; the patient's edema is under much better control. She has 3 areas one on the right anterior medial leg one on the left posterior leg and [name] small open area with purulent drainage on the left anterior leg. I have cultured the area on the left anterior leg 04/23/15; continued improvement in the patient's edema. All her wounds of closed I see no open areas. Culture from the left anterior leg last time was negative. READMISSION 02/07/16; this patient is [name] woman that I saw her earlier in 2017. I believe we discharged her very quickly with bilateral wounds in her legs secondary to chronic venous hypertension with inflammation and secondary lymphedema. She had juxtalite stockings. She has external compression pumps at home from [name] stay in our clinic in 2014. She tells me that she's had wounds on her bilateral lower legs since March or April of this year. 3 wounds on the left 2 on the right. She is [name] type II diabetic and has [name] history  of noncompressible vessels bilaterally although she is tolerated 4-layer compression. The last note from her primary physician Dr. [name] was in May. At that point Dr. [name] had referred the patient here for lymphedema management. The patient stated she made [name] phone call to year but was not able to arrange an appointment. It is not clear that she is actually using anything on the wounds currently except some gauze that was saturated with drainage per our intake nurse. She is certainly not using her juxtalite stockings and by the patient's own admission she is not using her compression pumps because they are "irritating". I don't see [name] recent hemoglobin A1c. The patient states she has not been systemically unwell but the legs are painful 02/14/16; patient tolerated her Profore wraps reasonably well. She is using the compression pumps 1 time per day. Measurements of her legs are better in terms of the amount of lymphedema 02/21/16; patient tolerated her Profore wraps it. She says she is using her compression pumps one or 2 times [name] day. Measurements of her leg circumference are improved and most of her wound dimensions are better, unfortunately the home health that we referred her to did not except her insurance but works successful in getting her accompanied that would except her insurance however she apparently has [name] $25 co-pay which she cannot afford as she is on [name] fixed income 03/05/16; patient states she did not back using the pumps. She is healed that 2 wounds on the left leg. She still has areas on the right lateral lower leg, right medial lower leg and left lateral lower leg. All of these  look some better and have reduced in area 03/13/16; still 3 wounds 1 on the left lateral leg, one on the left medial/posterior leg which is really the most extensive and [name] small area on the right lateral leg. All of this in the setting of severe chronic lymphedema and chronic venous inflammation.  Damage to the skin with cobblestone thickened skin. 03/20/16; still 2 areas. The area on the left lateral leg appears to of closed over. The right medial leg is still most extensive wound although it appears to be closing over right lateral only [name] small open area remains. Using silver alginate Profore wraps 03/27/16; the area on the left leg remains closed. The edema control is better on the left than the right. The right medial leg is still weeping but everything appears to be epithelialized. The lateral aspect of the right leg appears closed. She tells me she has old juxtalite stockings. She has been compliant with her compression pumps. 04/03/16; the area on the left leg remains closed. She has ordered new juxtalite stockings. The right posterior medial wound is fully epithelialized. However she has an irritated area over the right Achilles that she states is been itching all week under the wraps.. She has been compliant with her compression pumps 04/10/16; the area on the left leg remains closed. The right posterior medial wound is also closed today. The irritated area over the right Achilles which was probably wrap injury is also fully epithelialized and closed. The patient has not received her new juxtalite stockings, she arrives in clinic today with an old juxtalite stocking in place. Sheis using her compression pumps secondary to her severe stage III lymphedema READMISSION 01/21/17; this is [name] patient we know from several previous stays in this clinic. She has lymphedema with chronic venous insufficiency and inflammation. The last time she was here we discharged her with bilateral juxta lites which she is compliant with. She also has external compression pumps at home from stays in our clinic in 2014 although she tells me that she is not very compliant with these as when she uses them [name] causes pain in her bilateral knees the patient states that her wounds on her left greater than right leg  including 2 large areas on the left anterior and left lateral leg and an area on the right leg open in late June or July since then she is been followed by [facility] and she's had bilateral Unna boot supplied every week for the last month.. I think the wounds have actually improved although they are not specifically dressed. She states she had an ultrasound to rule out DVT bilaterally that was negative. She does not have arterial studies although she is [name] diabetic. She states she has [name] lot of pain in her legs she states she does not use her external compression pumps because it causes pain in her knees. As usual her ABIs were noncompressible bilaterally in this clinic 01/28/17; currently the patient only has one small wound on the left lateral leg and I think this should be healed next week. We are going to try to get her bilateral extremitease stockings. She tells me that compression pumps heard her posterior rib arthritic knees so she is not been compliant with this 02/04/17; the patient's wounds are totally healed. She has chronic venous insufficiency and secondary lymphedema. She has new extremitease stockings. She also has compression pumps Readmission: 04/21/17 on evaluation today patient appears to be doing about the same as what she was  previous when we were evaluating her back in November 2018. At that point she had completely healed. With that being said she has now reopened and states that she wasn't aware that she was supposed to come back. 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 Subsequently she also states that although she has the compression that we got for her previously the extremitease stockings she nonetheless doesn't always wear these and being noncompliant often has things will reopen as far as ulcers on her lower extremities. No fevers, chills, nausea, or vomiting  noted at this time. Patient has no evidence of dementia. She is having some discomfort in regard to the ulcers at this point. 04/28/17 on evaluation today patient appears to be doing significantly better in regard to her wounds. With that being said she is still having significant discomfort and is not really keen on sharp debridement. She does not appear to have any evidence of infection which is good news I do think the Iodoflex is beneficial for her. Of note her ABI's were found in the system to be on the right 1.21 with [name] TBI of 0.76 and on the left 1.35 with [name] TBI of 0.75. In general her swelling appears to be significantly improved however. No fevers, chills, nausea, or vomiting noted at this time. Patient has been tolerating the wraps without complication. She has no evidence of dementia. 05/05/17-she is here in follow-up evaluation for bilateral lower extremity venous and lymphedema ulcers. She states she has not been using her lymphedema pumps secondary to pain at her knees. After discussion it was noted that she uses knee-high lymphedema pumps but does have [name] pair of thigh-high lymphedema pumps. She was strongly advised to use the thigh-high lymphedema pumps and if necessary reduce the setting for improved tolerance. She reports no change in odor and/or drainage. She continues to tolerate sharp debridement poorly with significant pain, primarily to the right lower extremity. We will continue with current treatment plan, along with improved lymphedema pump use and follow-up next week 05/12/17 on evaluation today patient actually appears to be doing much better in regard to her bilateral lower extremity ulcers. Only the right altar actually requires debridement today. The other two cleaned off nicely in two of the three in fact on the left lower extremity or actually almost completely healed. She does have some weeping noted still the bilateral lower extremities left greater than right. She still  is not able to use her compression pumps even though I have mentioned this several times to her as did Leah last week. No fevers, chills, nausea, or vomiting noted at this time. Patient has been tolerating the dressing changes without complication. Patient has no evidence of dementia 05/19/17 on evaluation today patient appears to be doing very well regard to her bilateral lower extremity ulcers. In fact she seems to be improving and in fact healed in [name] couple of locations that were giving her trouble last week. Overall I'm extremely pleased with how things have progressed. She still has some discomfort especially in the right lateral lower extremity. Fortunately there does not appear to be any evidence of significant infection which is good news she does tell me that she has used her compression pumps several times although not routinely over the past week I did praise her for this. I do think it will be beneficial and that might even be what's helping  with her wounds as far as healing is concerned at least to [name] degree. No fevers, chills, nausea, or vomiting noted at this time. Patient has no dementia and no evidence of infection. 05/26/17 on evaluation today patient presents for evaluation concerning her bilateral lower extremity ulcer secondary to lymphedema. Fortunately she appears to be doing very well at this point in time. Specifically her right leg appears to be completely closed and healed. The left leg though there is still [name] small area of opening overall has healed. She does have [name] little bit of excoriation at the top or the wraps are because they slid down on her. Otherwise there's no evidence of infection and the other has improved dramatically now that she is not draining as significantly. Patient is having no significant pain she tells me she has been able to use her lymphedema pumps one time [name] day although that's not all she can tolerate. Even that is difficult for her. 06/02/17 on  evaluation today patient's ulcers appear to be completely closed at this point. She does still note some odor but I believe this is due to the fact she has not been able to wash her legs as well currently. Fortunately she did bring her compression with her today. She does have the EXTREMIT-EASE Compression. With that being said she is somewhat nervous about going back to this as previously she broke out yet again once we discontinue wrapping her. READMISSION 11/23/17; patient is now 75 year old diabetic woman with severe chronic venous insufficiency with lymphedema. We care for her last in this clinic in February 2019 at which time she had chronic venous insufficiency with lymphedema bilateral lower extremity ulcers. We discharged her with bilateral wrap around/extremitease stockings and compression pumps. Is fairly clear she is not using the compression pumps stating it causes pain in her knees. In any case she was admitted to hospital from 8/3 through 8/6 with [name] several week history of recurrent bilateral ulcers. She was felt to have bilateral cellulitis treated with doxycycline IV. She is still on oral doxycycline and has another 2 days worth. At discharge her white count was 14.1 hemoglobin at 8.9. The patient is [name] diabetic however her last ABIs in 2018 showed [name] right ABI of 1.21, left of 1.35. TBI's were 0.76 on the right 0.75 on the left. Posterior tibial artery was monophasic on the right triphasic on the left. Dorsalis pedis triphasic on the right and triphasic on the left. She is not felt to have significant macrovascular disease 12/02/17;significant bilateral lower extremity wounds secondary to severe uncontrolled lymphedema. She did not get home health out to change the wraps. So she kept this on all week. She is telling me she using his her compression pumps once [name] week. In general all the wounds look better. We've been using silver alginate with secondary absorptive dressings 4 layer  compression 12/10/17; bilateral lower extremity wounds secondary to uncontrolled lymphedema. She tells me she is using her compression pumps on most days. We've been keeping wraps on all week. Using silver alginate. She has wounds on the right lateral calf left medial and left lateral calf. All of this is somewhat better 12/16/17; the patient has 3 small open areas. One on the right lateral calf, one on the left medial calf and the small area on the left lateral calf. She has lymphedema, chronic venous insufficiency with hemosiderin deposition. Very fibrotic distal lower extremity scan with suggestion of an inverted bottle sign appearance to her legs. She has  compression pumps but she does not use them has not used them in the last week. Currently we have her in 4 layer compression, silver alginate to the wounds and this is being changed once by home health. She is noncompliant with the compression pumps because she says it hurts her knees. She also has compression stockings ando Juxta light stockings at home 12/24/17; the patient's right lateral calf is healed as well as the left medial. There is no open wound on the right leg. She still has 2 areas on the left lateral calf. We transitioned the right leg into an extremitease stocking which she brought in from home. 12/31/17; the patient has [name] superficial wound on the left lateral lower calf. We've been wrapping this leg. She arrives today with the right leg swollen but without [name] wound. She cannot get her extremities stocking on the right leg. There is [name] lot more edema here. She is not using her compression pumps 01/10/2018; patient has [name] same superficial wound on the left lateral calf. Her right leg has less peripheral edema. She tells me she also started herself on some Lasix that had been previously prescribed for her at some point but she had never taken it. She is also concerned about whether her compression pumps are leaking. She has extremitease  stockings however the edema gets bad enough she can get these on. Currently she has no open wound on the right leg and is mention the edema is actually better than when I saw this 10 days ago 01/17/2018; patient has [name] same superficial area on the posterior left calf and [name] weeping area in the mid part of the right lateral calf. She still does not have anyone from [facility] coming to see her compression pumps which she describes as being suspicious for [name] leakage. I am not really sure how frequently she is using this in any case. She has massive bilateral calf lymphedema. I think there is some spreading lymphedema into her posterior thighs. 01/24/2018; the patient's edema in her legs is actually some better. She still has [name] smaller area on the left posterior calf although this is better. The weeping area on the right lateral calf is also better. As it turned out she did not get her pumps from [facility] but [facility] and they are going to try to go out and see these this week which is going to help. I explained to her that she will use the compression pumps once [name] day while we are wrapping her legs but when she transitions to stockings that may be she will require pumping twice [name] day. Her compliance in this area has not been high and I wonder about her ability to do this herself. 02/01/2018; the area on the posterior left leg is healed. She still has the original wound on the right lateral calf which is epithelialized. The however just above this there is still weeping lymphedema fluid. She has [name] new area on the upper right calf which is either wrap injury or is she points out where she has been scratching under the wraps. We still not have managed to get [name] hold of [name] representative of [facility] to see if the pump is operational. She has extremitease stockings at home however I am not of the opinion that this will maintain her skin integrity without the compression  pumps. 02/08/2018; posterior left leg remains healed although she has [name] new small open area on the left lateral calf. Right  lateral calf has two quarter sized open areas and close juxtaposition. There is less weeping edema fluid. She apparently managed to contact [facility]. They are sending her [name] part. But she is still not received it 02/15/2018; the patient has no open wound on either leg. Some scale present on the right upper lateral calf just below the fibular head. Edema control is excellent. She did not bring her stockings or her juxta lite wrap around. She has the part for her compression pump but she has not use the compression pumps yet READMISSION 01/10/2019 Patient is now [name] 75 year old woman that we have had in the clinic multiple times in the past. She has chronic lower extremity lymphedema, recurrent wounds on her bilateral lower extremities. She is also [name] type II diabetic reasonably well controlled with [name] recent hemoglobin A1c while she was in the hospital of 5.9. She has wounds on her bilateral lower extremities mostly on the left lateral and right lateral. These were noted also during her recent hospitalization. The patient states they have been there for [name] while. She has not been able to wear her stockings he has trouble getting them on as she lives alone. She also has external compression pumps but uses them sparingly because it causes pain in her knees which hurt because of osteoarthritis. The patient was on doxycycline before she went into the hospital apparently for fear of infection in her legs and is on Keflex coming out because of [name] UTI The patient was recently in the hospital from 01/06/2019 through 01/09/2019. She was admitted with nausea and vomiting and prerenal azotemia. This was corrected with IV fluid her Lasix has been put on hold. Hemoglobin A1c at 5.9 her metformin was discontinued. She was also felt to have [name] UTI she was prescribed Keflex at 503 times [name] day she  is picking that up today. The patient is not felt to have an arterial issue. ABIs in our clinic were 1.24 on the right and 1.06 on the left. She had formal studies in 2018 at which time her ABIs were 1.21 on the right TBI of 0.76 and on the left 1.35 and 0.75 respectively. Waveforms are on the right were monophasic and biphasic, triphasic and biphasic on the left. She is tolerated 4 layer compression in the past 10/13. Comes in today having not used her compression pumps /perhaps once in the last week. She has too much edema to consider healing these wounds. We have been using silver alginate 10/20; she tells me he had she used her compression pumps once [name] day as we asked. Clearly she has edema out of her legs although most of it seems to be settling around her knees which is what she complains about. We have been using silver alginate to the extensive de-epithelialized area on the left lateral calf and [name] much smaller area on the right lateral extending linearly into the right posterior 10/27; she is using her compression pumps daily. Her edema control is better. We have been using silver alginate to the extensive wound areas on her bilateral lower legs. This includes left anterior left lateral and left posterior. Right anterior right lateral and right posterior. The wounds are not all in the same condition. She has severe bilateral skin damage with the epithelialization, flaking dried epithelium 11/3; she is using her compression pumps 1 times daily. Pretty obvious that her wraps fell down especially on the left leg to mid calf. I think things are improving on the right lateral  calf, I do not see anything open posteriorly. The major areas on the left lateral where she has 3 or 4 deeper wounds in the midst of an entire de-epithelialized area. There is erythema here but no tenderness I think this represents venous stasis rather than cellulitis. I debrided the areas on the left lateral calf last week  but once again they have tightly adherent debris over the top of them which is disappointing 11/10; the patient's major areas on the left lateral calf to 3 wounds with some depth surrounded by [name] large area of de-epithelialized tissue. There is no infection. We have been using silver alginate under 4-layer compression. On the right lateral she has [name] more contained wound area that is superficial. Been using silver alginate under 4-layer compression 11/17; the patient's major wound is on the left lateral calf. We have been using silver alginate under compression. There is no open wound per se on the right but it de-epithelialized area on the lateral calf that is weeping edema fluid. I do not see much change here today. 12/1; patient arrives in clinic with her wounds looking better. Her edema control is better. The lymphedema specialist working on the left leg we have been working on the right. We have been using 4-layer compression. The patient is using [name] consider external compression pumps and Hydrofera Blue to the open areas. She is being measured for an abdominal compression pump and apparently that is going to go through her insurance 12/15; continued improvement. 2 small areas on the left and slightly larger 2 small areas on the right. 12/22; patient still has open areas on the right lateral calf which are superficial and the new one on the right anterior calf. She did not use her compression pumps this week. Her left leg is wrapped by our lymphedema specialist. There are no open wounds here 12/29; she still has [name] superficial area on the right lateral calf and area on the right medial calf. Right anterior wound area appears to have closed. She has much better edema control today than last week 04/11/2019; the patient is here with severe bilateral lymphedema, chronic venous insufficiency with marked skin changes in her lower extremities. We are wrapping her right leg and her lymphedema specialist is  putting lymphedema wraps on the left. She states she is using her compression pumps although in the past her compliance with uses been limited because of osteoarthritic pain in her knees. She only has one remaining area on the right leg however she has wrap injuries on the left at the upper level what where I think her wraps were cause. the left lateral area left posterior. These would be new this week 1/12; generally better. The wrap injuries in the upper calf on the left from last week are down to [name] single wound. She has 2 wounds anteriorly that are still weeping fluid although these appear to have been epithelialized surface for the most part. There is no open wounds on the right leg we are wrapping her right leg and 4-layer compression and we are going [name] lymphedema wrap on the left leg. The the approach is [name] Farrow 2000 wrap on the left leg and then subsequently the right leg. She claims to be compliant with her compression pumps 1/19; the patient has 1 tiny open area on the right medial calf. The rest of this is not open. She however has [name] large erythematous denuded area on the left anterior tibia extending laterally. There is no evidence  of infection here. She has her new current waist high compression pumps 1/26; the patient has no open wounds on the right. On the left anteriorly much better than last week still [name] smattering of small weeping open areas. There is no evidence of infection 2/2; still no open wounds on the right leg. He still does not have [name] external compression garment. She arrives in clinic today with an open area on the plantar aspect of the left second toe at the base of the PIP. This apparently was painful last week so it was not wrapped by our lymphedema specialist. There was no open wound at the time. The patient denies traumatizing the toe however today she arrives with [name] small but deep wound in this area the dorsal surface of the toe is very tender. 2/11; she arrives in  clinic today with everything epithelialized. She was that way last week on the right but we have still been wrapping. She has been using lymphedema wraps on the left. On careful inspection of the left leg she still has areas that look vulnerable but no clearly open wound there may be some weeping through cracks in the skin. She is using above the waist external compression pumps and she has [name] separate external compression garment for her thighs. The plan is to discharge her for follow-up in lymphedema clinic with Juliann Pulse our lymphedema specialist. In the past healing this woman has resulted in relatively quick breakdown however she generally seems more motivated example willing to use her external compression pumps etc. READMISSION 02/13/2020. Mrs. Harney is [name] now 75 year old woman who has bilateral lower extremity lymphedema and [name] history of difficult to treat bilateral lower extremity wounds. When she was here last time we had her seen by [name] lymphedema specialist. She had lower extremity compression stockings as well as compression garments for her thighs. She has compression pumps as well. Unfortunately she has not been doing well. She describes unrelenting nausea with vomiting anytime she tries to eat or drink or take her medications. She was seen in urgent care on 10/26. Notably they found her to be somewhat dehydrated. Gave her [name] liter of fluid. Her creatinine was up to 2.42. The rest of her lab work including liver function tests, white count, hemoglobin were all normal. She did have [name] urine for culture that showed Klebsiella she was prescribed ciprofloxacin but I seriously doubt that she is able to keep this down by her description. She has not taken any of her meds in over [name] month. She states she feels too weak to take care of her lower extremities with compression garments and her external compression pumps she has not been using any of these. As [name] result she has 2 large areas on the left lateral  calf one anteriorly [name] cluster of 3 on the right lateral and [name] small area on her right ankle. All of these are superficial. She has poorly controlled lymphedema especially on the anterior tibial areas however by my my anxiety she actually looks like she has less swelling than what I remember. Past medical history includes type 2 diabetes with [name] recent hemoglobin A1c of 6.8 chronic venous insufficiency, lymphedema which she was managing with above the waist compression and compression pumps, diastolic congestive heart failure. Nausea and vomiting as described above. We did not repeat her arterial studies today however these were quite normal when they were last done in 2018 with an ABI of 1.21 on the right and 1.35 on the left.  TBI's were within normal limits at 0.76 and 0.75 she had triphasic waveforms on the left monophasic waveforms at the posterior tibial but triphasic waveforms at the dorsalis pedis. She has previously tolerated 4 layer compression 11/18; the patient has [name] cluster of wounds on the right lateral lower calf and areas on the left lateral calf. We have been using silver alginate under compression she seems to be tolerating this well. Her edema is improved. HOWEVER she arrives in clinic today still with [name] description of bilious vomiting that is making it impossible for her to eat and drink. Her vitals are stable however she looks more gaunt. I have tried to talk her into going to the emergency room to be checked out however she will not hear anything of it 11/29; the patient's right leg is closed. On the left leg she has 2 small superficial open areas anteriorly and one just to the lateral part of the tibia. These are just about closed. She did go to the ER with regards to her persistent nausea and vomiting. Apparently they did not find anything and did [name] CT scan of her abdomen although I have not yet had [name] chance to review these records Readmission: 10/16/2020 upon evaluation today  patient presents for reevaluation here in the clinic due to issues that she is can have one of the bilateral lower extremities. More importantly she has an issue which looks to be more of [name] friction injury to be honest in the posterior upper thigh on the left. Again this appears to potentially be where [name] brief could have been pulled [name] little bit causing [name] friction injury. Again it does not look like [name] pressure injury at all very superficial not really draining much at all. Fortunately there does not appear to be any signs of active infection at this time which is great news. No fevers, chills, nausea, vomiting, or diarrhea. Patient does have [name] history of bilateral lower extremity lymphedema, hypertension, congestive heart failure, and long-term use of anticoagulant therapy. She is currently on Eliquis. Unfortunately since we last seen her she apparently ended up apparently fallen in her home and she was subsequently found on the ground by her daughter admitted to the hospital for altered mental status and weakness she was found to have [name] UTI and Warnicke's encephalopathy. Upon discharge mental status had improved she had fevers which were thought to be potentially due to pseudogout flare. She did improve with steroids. She also had an EGD which showed reflux esophagitis and multiple large and small nonbleeding duodenal ulcers. She was discharged on 04/08/2020 to [name] SN[facility] for [facility]hab she has been there since. It was her daughter who actually found her as she was not able to get in touch with her mom. For that reason she called the police for [name] safety check and found her lying on the floor covered with stool and bugs crawling all over her. She was seen by her PCP for intractable nausea and vomiting 4 to 6 weeks prior. She was found to have profound weakness which honestly has not really improved much at this point. Her CT scan was negative. 8/31; the patient returns to clinic now at [name] local nursing home I  believe on [place]. It sounds as though she is been discharged for rehab and is now [name] chronic care patient in the facility. 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 She is cognitively intact. She comes in the clinic  with the wound on her left medial upper thigh. She has kerlix Coban wraps on her bilateral lower extremities and [name] vague history of wound on her buttock. Supposed to be using Xeroform according to [facility] last notes from July although she came in with nothing on the thigh wound today 9/28; patient comes in today with the area on her left medial upper thigh posteriorly actually quite [name] bit worse than last time. When she was here last month she had [name] single open area she comes in today with multiple open areas and denuded skin some of the areas look like reniform purpura however on closer inspection these look like blisters. There was denuded skin hanging off these areas. I think they are using silver alginate not sure what they are putting on top of this. The patient tells me that she is not much up in the wheelchair she sits on the side of her bed for long periods as [name] result. [name] lot of this is because of lower extremity edema Electronic Signature(s) Signed: 01/01/2021 5:00:15 PM By: Linton Ham MD Entered By: Linton Ham on 01/01/2021 10:55:25 -------------------------------------------------------------------------------- Physical Exam Details Patient Name: Date of Service: [name] [name], [name] [name] 01/01/2021 9:30 [name] M Medical Record Number: [id number] Patient Account Number: [id number] Date of Birth/Sex: Treating RN: 1945-12-06 (75 y.o. [name] Primary Care Provider: [name] Other Clinician: Referring Provider: Treating Provider/Extender: [name] in Treatment: 11 Notes Wound exam; right posterior medial thigh. 4 superficial open areas with denuded skin. Some of  this pattern here almost look like reniform purpura. I used pickups and scissors to remove some of the denuded skin. The wounds themselves look superficial but quite [name] bit worse than the last time she was here. Both her lower extremities are wrapped we did not remove these today. I did not look at her buttocks we all of that we did look at this last time and she had no open areas. Electronic Signature(s) Signed: 01/01/2021 5:00:15 PM By: Linton Ham MD Entered By: Linton Ham on 01/01/2021 10:56:47 -------------------------------------------------------------------------------- Physician Orders Details Patient Name: Date of Service: [name] [name], [name] [name] 01/01/2021 9:30 [name] M Medical Record Number: [id number] Patient Account Number: [id number] Date of Birth/Sex: Treating RN: [birth year] (75 y.o. Tonita Phoenix, Lauren Primary Care Provider: [name] Other Clinician: Referring Provider: Treating Provider/Extender: [name] in Treatment: 11 Verbal / Phone Orders: No Diagnosis Coding Follow-up Appointments Return appointment in 1 month. - ***75 minutes [facility]**** Dr. Dellia Nims Other: - Patient resides at [facility]. Please let [facility] know if pt. gets any other wounds (i.e. legs or buttocks). Bathing/ Shower/ Hygiene May shower and wash wound with soap and water. Edema Control - Lymphedema / SCD / Other Bilateral Lower Extremities Elevate legs to the level of the heart or above for 30 minutes daily and/or when sitting, [name] frequency of: Moisturize legs daily. Other Edema Control Orders/Instructions: - Apply Kerlix/Coban 2 layer wrap to bilateral legs, change 2x week.4 [facility] can order compression stockings 20-76mm/Hg and pt. can wear those instead of the kerlix/coban wrap.e Off-Loading Other: - Try to minimize friction/shearing to area Additional Orders / Instructions Follow Nutritious Diet Wound Treatment Wound #47 - Upper Leg Wound  Laterality: Left, Posterior Cleanser: Soap and Water 3 x Per Week/30 Days Discharge Instructions: May shower and wash wound with dial antibacterial soap and water prior to dressing change. Cleanser: Wound Cleanser 3 x Per Week/30 Days Discharge Instructions: Cleanse the wound with wound cleanser  prior to applying [name] clean dressing using gauze sponges, not tissue or cotton balls. Prim Dressing: Maxorb Extra Calcium Alginate Dressing, 4x4 in 3 x Per Week/30 Days ary Discharge Instructions: Apply calcium alginate to wound bed as instructed Secondary Dressing: ABD Pad, 5x9 3 x Per Week/30 Days Discharge Instructions: Apply over primary dressing as directed. Secured With: [employer], 4.5x3.1 (in/yd) 3 x Per Week/30 Days Discharge Instructions: Secure with Kerlix as directed. Secured With: 30M Medipore H Soft Cloth Surgical T 4 x 2 (in/yd) 3 x Per Week/30 Days ape Discharge Instructions: Secure dressing with tape as directed. Electronic Signature(s) Signed: 01/01/2021 5:00:15 PM By: Linton Ham MD Signed: 01/01/2021 5:21:18 PM By: Rhae Hammock RN Entered By: Rhae Hammock on 01/01/2021 10:14:48 -------------------------------------------------------------------------------- Problem List Details Patient Name: Date of Service: [name] [name], [name] [name] 01/01/2021 9:30 [name] M Medical Record Number: [id number] Patient Account Number: [id number] Date of Birth/Sex: Treating RN: Nov 03, 1945 (75 y.o. [name] Primary Care Provider: [name] Other Clinician: Referring Provider: Treating Provider/Extender: [name] in Treatment: 11 Active Problems ICD-10 Encounter Code Description Active Date MDM Diagnosis I89.0 Lymphedema, not elsewhere classified 10/16/2020 No Yes M79.89 Other specified soft tissue disorders 10/16/2020 No Yes L97.122 Non-pressure chronic ulcer of left thigh with fat layer exposed 10/16/2020 No Yes I10 Essential  (primary) hypertension 10/16/2020 No Yes I50.42 Chronic combined systolic (congestive) and diastolic (congestive) heart failure 10/16/2020 No Yes Z79.01 Long term (current) use of anticoagulants 10/16/2020 No Yes Inactive Problems Resolved Problems Electronic Signature(s) Signed: 01/01/2021 5:00:15 PM By: Linton Ham MD Entered By: Linton Ham on 01/01/2021 10:52:08 -------------------------------------------------------------------------------- Progress Note Details Patient Name: Date of Service: [name] [name], [name] [name] 01/01/2021 9:30 [name] M Medical Record Number: [id number] Patient Account Number: [id number] Date of Birth/Sex: Treating RN: [birth year] (75 y.o. [name] Primary Care Provider: [name] Other Clinician: Referring Provider: Treating Provider/Extender: [name] in Treatment: 11 Subjective History of Present Illness (HPI) 04/09/15; the patient is here for review of wounds on her bilateral lower extremities for the last month. The patient has [name] history of lymphedema and bilateral chronic venous insufficiency with inflammation she was here for review of wounds on her bilateral legs in 2014 she was discharged home with external compression pumps which she is currently not using. Over the last month she developed wounds on her bilateral lower legs with drainage and pain and she is here for our review of this. 04/16/15; the patient's edema is under much better control. She has 3 areas one on the right anterior medial leg one on the left posterior leg and [name] small open area with purulent drainage on the left anterior leg. I have cultured the area on the left anterior leg 04/23/15; continued improvement in the patient's edema. All her wounds of closed I see no open areas. Culture from the left anterior leg last time was negative. READMISSION 02/07/16; this patient is [name] woman that I saw her earlier in 2017. I believe we discharged her very  quickly with bilateral wounds in her legs secondary to chronic venous hypertension with inflammation and secondary lymphedema. She had juxtalite stockings. She has external compression pumps at home from [name] stay in our clinic in 2014. She tells me that she's had wounds on her bilateral lower legs since March or April of this year. 3 wounds on the left 2 on the right. She is [name] type II diabetic and has [name] history of noncompressible vessels bilaterally although she is tolerated  4-layer compression. The last note from her primary physician Dr. [name] was in May. At that point Dr. [name] had referred the patient here for lymphedema management. The patient stated she made [name] phone call to year but was not able to arrange an appointment. It is not clear that she is actually using anything on the wounds currently except some gauze that was saturated with drainage per our intake nurse. She is certainly not using her juxtalite stockings and by the patient's own admission she is not using her compression pumps because they are "irritating". I don't see [name] recent hemoglobin A1c. The patient states she has not been systemically unwell but the legs are painful 02/14/16; patient tolerated her Profore wraps reasonably well. She is using the compression pumps 1 time per day. Measurements of her legs are better in terms of the amount of lymphedema 02/21/16; patient tolerated her Profore wraps it. She says she is using her compression pumps one or 2 times [name] day. Measurements of her leg circumference are improved and most of her wound dimensions are better, unfortunately the home health that we referred her to did not except her insurance but works successful in getting her accompanied that would except her insurance however she apparently has [name] $25 co-pay which she cannot afford as she is on [name] fixed income 03/05/16; patient states she did not back using the pumps. She is healed that 2 wounds on the left leg.  She still has areas on the right lateral lower leg, right medial lower leg and left lateral lower leg. All of these look some better and have reduced in area 03/13/16; still 3 wounds 1 on the left lateral leg, one on the left medial/posterior leg which is really the most extensive and [name] small area on the right lateral leg. All of this in the setting of severe chronic lymphedema and chronic venous inflammation. Damage to the skin with cobblestone thickened skin. 03/20/16; still 2 areas. The area on the left lateral leg appears to of closed over. The right medial leg is still most extensive wound although it appears to be closing over right lateral only [name] small open area remains. Using silver alginate Profore wraps 03/27/16; the area on the left leg remains closed. The edema control is better on the left than the right. The right medial leg is still weeping but everything appears to be epithelialized. The lateral aspect of the right leg appears closed. She tells me she has old juxtalite stockings. She has been compliant with her compression pumps. 04/03/16; the area on the left leg remains closed. She has ordered new juxtalite stockings. The right posterior medial wound is fully epithelialized. However she has an irritated area over the right Achilles that she states is been itching all week under the wraps.. She has been compliant with her compression pumps 04/10/16; the area on the left leg remains closed. The right posterior medial wound is also closed today. The irritated area over the right Achilles which was probably wrap injury is also fully epithelialized and closed. The patient has not received her new juxtalite stockings, she arrives in clinic today with an old juxtalite stocking in place. Sheis using her compression pumps secondary to her severe stage III lymphedema READMISSION 01/21/17; this is [name] patient we know from several previous stays in this clinic. She has lymphedema with chronic venous  insufficiency and inflammation. The last time she was here we discharged her with bilateral juxta lites which she is compliant with.  She also has external compression pumps at home from stays in our clinic in 2014 although she tells me that she is not very compliant with these as when she uses them [name] causes pain in her bilateral knees the patient states that her wounds on her left greater than right leg including 2 large areas on the left anterior and left lateral leg and an area on the right leg open in late June or July since then she is been followed by [facility] and she's had bilateral Unna boot supplied every week for the last month.. I think the wounds have actually improved although they are not specifically dressed. She states she had an ultrasound to rule out DVT bilaterally that was negative. She does not have arterial studies although she is [name] diabetic. She states she has [name] lot of pain in her legs she states she does not use her external compression pumps because it causes pain in her knees. As usual her ABIs were noncompressible bilaterally in this clinic 01/28/17; currently the patient only has one small wound on the left lateral leg and I think this should be healed next week. We are going to try to get her bilateral extremitease stockings. She tells me that compression pumps heard her posterior rib arthritic knees so she is not been compliant with this 02/04/17; the patient's wounds are totally healed. She has chronic venous insufficiency and secondary lymphedema. She has new extremitease stockings. She also has compression pumps Readmission: 04/21/17 on evaluation today patient appears to be doing about the same as what she was previous when we were evaluating her back in November 2018. At that point she had completely healed. With that being said she has now reopened and states that she wasn't aware that she was supposed to come back. 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 Subsequently she also states that although she has the compression that we got for her previously the extremitease stockings she nonetheless doesn't always wear these and being noncompliant often has things will reopen as far as ulcers on her lower extremities. No fevers, chills, nausea, or vomiting noted at this time. Patient has no evidence of dementia. She is having some discomfort in regard to the ulcers at this point. 04/28/17 on evaluation today patient appears to be doing significantly better in regard to her wounds. With that being said she is still having significant discomfort and is not really keen on sharp debridement. She does not appear to have any evidence of infection which is good news I do think the Iodoflex is beneficial for her. Of note her ABI's were found in the system to be on the right 1.21 with [name] TBI of 0.76 and on the left 1.35 with [name] TBI of 0.75. In general her swelling appears to be significantly improved however. No fevers, chills, nausea, or vomiting noted at this time. Patient has been tolerating the wraps without complication. She has no evidence of dementia. 05/05/17-she is here in follow-up evaluation for bilateral lower extremity venous and lymphedema ulcers. She states she has not been using her lymphedema pumps secondary to pain at her knees. After discussion it was noted that she uses knee-high lymphedema pumps but does have [name] pair of thigh-high lymphedema pumps. She was strongly advised to use the thigh-high lymphedema pumps and if necessary reduce the setting for improved tolerance. She reports no change in  odor and/or drainage. She continues to tolerate sharp debridement poorly with significant pain, primarily to the right lower extremity. We will continue with current treatment plan, along with improved lymphedema pump use and follow-up next week 05/12/17 on  evaluation today patient actually appears to be doing much better in regard to her bilateral lower extremity ulcers. Only the right altar actually requires debridement today. The other two cleaned off nicely in two of the three in fact on the left lower extremity or actually almost completely healed. She does have some weeping noted still the bilateral lower extremities left greater than right. She still is not able to use her compression pumps even though I have mentioned this several times to her as did Leah last week. No fevers, chills, nausea, or vomiting noted at this time. Patient has been tolerating the dressing changes without complication. Patient has no evidence of dementia 05/19/17 on evaluation today patient appears to be doing very well regard to her bilateral lower extremity ulcers. In fact she seems to be improving and in fact healed in [name] couple of locations that were giving her trouble last week. Overall I'm extremely pleased with how things have progressed. She still has some discomfort especially in the right lateral lower extremity. Fortunately there does not appear to be any evidence of significant infection which is good news she does tell me that she has used her compression pumps several times although not routinely over the past week I did praise her for this. I do think it will be beneficial and that might even be what's helping with her wounds as far as healing is concerned at least to [name] degree. No fevers, chills, nausea, or vomiting noted at this time. Patient has no dementia and no evidence of infection. 05/26/17 on evaluation today patient presents for evaluation concerning her bilateral lower extremity ulcer secondary to lymphedema. Fortunately she appears to be doing very well at this point in time. Specifically her right leg appears to be completely closed and healed. The left leg though there is still [name] small area of opening overall has healed. She does have [name] little bit  of excoriation at the top or the wraps are because they slid down on her. Otherwise there's no evidence of infection and the other has improved dramatically now that she is not draining as significantly. Patient is having no significant pain she tells me she has been able to use her lymphedema pumps one time [name] day although that's not all she can tolerate. Even that is difficult for her. 06/02/17 on evaluation today patient's ulcers appear to be completely closed at this point. She does still note some odor but I believe this is due to the fact she has not been able to wash her legs as well currently. Fortunately she did bring her compression with her today. She does have the EXTREMIT-EASE Compression. With that being said she is somewhat nervous about going back to this as previously she broke out yet again once we discontinue wrapping her. READMISSION 11/23/17; patient is now 75 year old diabetic woman with severe chronic venous insufficiency with lymphedema. We care for her last in this clinic in February 2019 at which time she had chronic venous insufficiency with lymphedema bilateral lower extremity ulcers. We discharged her with bilateral wrap around/extremitease stockings and compression pumps. Is fairly clear she is not using the compression pumps stating it causes pain in her knees. In any case she was admitted to hospital from 8/3 through 8/6 with [name]  several week history of recurrent bilateral ulcers. She was felt to have bilateral cellulitis treated with doxycycline IV. She is still on oral doxycycline and has another 2 days worth. At discharge her white count was 14.1 hemoglobin at 8.9. The patient is [name] diabetic however her last ABIs in 2018 showed [name] right ABI of 1.21, left of 1.35. TBI's were 0.76 on the right 0.75 on the left. Posterior tibial artery was monophasic on the right triphasic on the left. Dorsalis pedis triphasic on the right and triphasic on the left. She is not felt to have  significant macrovascular disease 12/02/17;significant bilateral lower extremity wounds secondary to severe uncontrolled lymphedema. She did not get home health out to change the wraps. So she kept this on all week. She is telling me she using his her compression pumps once [name] week. In general all the wounds look better. We've been using silver alginate with secondary absorptive dressings 4 layer compression 12/10/17; bilateral lower extremity wounds secondary to uncontrolled lymphedema. She tells me she is using her compression pumps on most days. We've been keeping wraps on all week. Using silver alginate. She has wounds on the right lateral calf left medial and left lateral calf. All of this is somewhat better 12/16/17; the patient has 3 small open areas. One on the right lateral calf, one on the left medial calf and the small area on the left lateral calf. She has lymphedema, chronic venous insufficiency with hemosiderin deposition. Very fibrotic distal lower extremity scan with suggestion of an inverted bottle sign appearance to her legs. She has compression pumps but she does not use them has not used them in the last week. Currently we have her in 4 layer compression, silver alginate to the wounds and this is being changed once by home health. She is noncompliant with the compression pumps because she says it hurts her knees. She also has compression stockings ando Juxta light stockings at home 12/24/17; the patient's right lateral calf is healed as well as the left medial. There is no open wound on the right leg. She still has 2 areas on the left lateral calf. We transitioned the right leg into an extremitease stocking which she brought in from home. 12/31/17; the patient has [name] superficial wound on the left lateral lower calf. We've been wrapping this leg. She arrives today with the right leg swollen but without [name] wound. She cannot get her extremities stocking on the right leg. There is [name] lot more  edema here. She is not using her compression pumps 01/10/2018; patient has [name] same superficial wound on the left lateral calf. Her right leg has less peripheral edema. She tells me she also started herself on some Lasix that had been previously prescribed for her at some point but she had never taken it. She is also concerned about whether her compression pumps are leaking. She has extremitease stockings however the edema gets bad enough she can get these on. Currently she has no open wound on the right leg and is mention the edema is actually better than when I saw this 10 days ago 01/17/2018; patient has [name] same superficial area on the posterior left calf and [name] weeping area in the mid part of the right lateral calf. She still does not have anyone from [facility] coming to see her compression pumps which she describes as being suspicious for [name] leakage. I am not really sure how frequently she is using this in any case. She has massive  bilateral calf lymphedema. I think there is some spreading lymphedema into her posterior thighs. 01/24/2018; the patient's edema in her legs is actually some better. She still has [name] smaller area on the left posterior calf although this is better. The weeping area on the right lateral calf is also better. As it turned out she did not get her pumps from [facility] but [facility] and they are going to try to go out and see these this week which is going to help. I explained to her that she will use the compression pumps once [name] day while we are wrapping her legs but when she transitions to stockings that may be she will require pumping twice [name] day. Her compliance in this area has not been high and I wonder about her ability to do this herself. 02/01/2018; the area on the posterior left leg is healed. She still has the original wound on the right lateral calf which is epithelialized. The however just above this there is still weeping lymphedema fluid. She  has [name] new area on the upper right calf which is either wrap injury or is she points out where she has been scratching under the wraps. We still not have managed to get [name] hold of [name] representative of [facility] to see if the pump is operational. She has extremitease stockings at home however I am not of the opinion that this will maintain her skin integrity without the compression pumps. 02/08/2018; posterior left leg remains healed although she has [name] new small open area on the left lateral calf. Right lateral calf has two quarter sized open areas and close juxtaposition. There is less weeping edema fluid. She apparently managed to contact [facility]. They are sending her [name] part. But she is still not received it 02/15/2018; the patient has no open wound on either leg. Some scale present on the right upper lateral calf just below the fibular head. Edema control is excellent. She did not bring her stockings or her juxta lite wrap around. She has the part for her compression pump but she has not use the compression pumps yet READMISSION 01/10/2019 Patient is now [name] 75 year old woman that we have had in the clinic multiple times in the past. She has chronic lower extremity lymphedema, recurrent wounds on her bilateral lower extremities. She is also [name] type II diabetic reasonably well controlled with [name] recent hemoglobin A1c while she was in the hospital of 5.9. She has wounds on her bilateral lower extremities mostly on the left lateral and right lateral. These were noted also during her recent hospitalization. The patient states they have been there for [name] while. She has not been able to wear her stockings he has trouble getting them on as she lives alone. She also has external compression pumps but uses them sparingly because it causes pain in her knees which hurt because of osteoarthritis. The patient was on doxycycline before she went into the hospital apparently for fear of infection in her  legs and is on Keflex coming out because of [name] UTI The patient was recently in the hospital from 01/06/2019 through 01/09/2019. She was admitted with nausea and vomiting and prerenal azotemia. This was corrected with IV fluid her Lasix has been put on hold. Hemoglobin A1c at 5.9 her metformin was discontinued. She was also felt to have [name] UTI she was prescribed Keflex at 503 times [name] day she is picking that up today. The patient is not felt to have an arterial  issue. ABIs in our clinic were 1.24 on the right and 1.06 on the left. She had formal studies in 2018 at which time her ABIs were 1.21 on the right TBI of 0.76 and on the left 1.35 and 0.75 respectively. Waveforms are on the right were monophasic and biphasic, triphasic and biphasic on the left. She is tolerated 4 layer compression in the past 10/13. Comes in today having not used her compression pumps /perhaps once in the last week. She has too much edema to consider healing these wounds. We have been using silver alginate 10/20; she tells me he had she used her compression pumps once [name] day as we asked. Clearly she has edema out of her legs although most of it seems to be settling around her knees which is what she complains about. We have been using silver alginate to the extensive de-epithelialized area on the left lateral calf and [name] much smaller area on the right lateral extending linearly into the right posterior 10/27; she is using her compression pumps daily. Her edema control is better. We have been using silver alginate to the extensive wound areas on her bilateral lower legs. This includes left anterior left lateral and left posterior. Right anterior right lateral and right posterior. The wounds are not all in the same condition. She has severe bilateral skin damage with the epithelialization, flaking dried epithelium 11/3; she is using her compression pumps 1 times daily. Pretty obvious that her wraps fell down especially on the left leg  to mid calf. I think things are improving on the right lateral calf, I do not see anything open posteriorly. The major areas on the left lateral where she has 3 or 4 deeper wounds in the midst of an entire de-epithelialized area. There is erythema here but no tenderness I think this represents venous stasis rather than cellulitis. I debrided the areas on the left lateral calf last week but once again they have tightly adherent debris over the top of them which is disappointing 11/10; the patient's major areas on the left lateral calf to 3 wounds with some depth surrounded by [name] large area of de-epithelialized tissue. There is no infection. We have been using silver alginate under 4-layer compression. On the right lateral she has [name] more contained wound area that is superficial. Been using silver alginate under 4-layer compression 11/17; the patient's major wound is on the left lateral calf. We have been using silver alginate under compression. There is no open wound per se on the right but it de-epithelialized area on the lateral calf that is weeping edema fluid. I do not see much change here today. 12/1; patient arrives in clinic with her wounds looking better. Her edema control is better. The lymphedema specialist working on the left leg we have been working on the right. We have been using 4-layer compression. The patient is using [name] consider external compression pumps and Hydrofera Blue to the open areas. She is being measured for an abdominal compression pump and apparently that is going to go through her insurance 12/15; continued improvement. 2 small areas on the left and slightly larger 2 small areas on the right. 12/22; patient still has open areas on the right lateral calf which are superficial and the new one on the right anterior calf. She did not use her compression pumps this week. Her left leg is wrapped by our lymphedema specialist. There are no open wounds here 12/29; she still has [name]  superficial area on the  right lateral calf and area on the right medial calf. Right anterior wound area appears to have closed. She has much better edema control today than last week 04/11/2019; the patient is here with severe bilateral lymphedema, chronic venous insufficiency with marked skin changes in her lower extremities. We are wrapping her right leg and her lymphedema specialist is putting lymphedema wraps on the left. She states she is using her compression pumps although in the past her compliance with uses been limited because of osteoarthritic pain in her knees. She only has one remaining area on the right leg however she has wrap injuries on the left at the upper level what where I think her wraps were cause. the left lateral area left posterior. These would be new this week 1/12; generally better. The wrap injuries in the upper calf on the left from last week are down to [name] single wound. She has 2 wounds anteriorly that are still weeping fluid although these appear to have been epithelialized surface for the most part. There is no open wounds on the right leg we are wrapping her right leg and 4-layer compression and we are going [name] lymphedema wrap on the left leg. The the approach is [name] Farrow 2000 wrap on the left leg and then subsequently the right leg. She claims to be compliant with her compression pumps 1/19; the patient has 1 tiny open area on the right medial calf. The rest of this is not open. She however has [name] large erythematous denuded area on the left anterior tibia extending laterally. There is no evidence of infection here. She has her new current waist high compression pumps 1/26; the patient has no open wounds on the right. On the left anteriorly much better than last week still [name] smattering of small weeping open areas. There is no evidence of infection 2/2; still no open wounds on the right leg. He still does not have [name] external compression garment. She arrives in clinic today  with an open area on the plantar aspect of the left second toe at the base of the PIP. This apparently was painful last week so it was not wrapped by our lymphedema specialist. There was no open wound at the time. The patient denies traumatizing the toe however today she arrives with [name] small but deep wound in this area the dorsal surface of the toe is very tender. 2/11; she arrives in clinic today with everything epithelialized. She was that way last week on the right but we have still been wrapping. She has been using lymphedema wraps on the left. On careful inspection of the left leg she still has areas that look vulnerable but no clearly open wound there may be some weeping through cracks in the skin. She is using above the waist external compression pumps and she has [name] separate external compression garment for her thighs. The plan is to discharge her for follow-up in lymphedema clinic with Juliann Pulse our lymphedema specialist. In the past healing this woman has resulted in relatively quick breakdown however she generally seems more motivated example willing to use her external compression pumps etc. READMISSION 02/13/2020. Mrs. Kozicki is [name] now 75 year old woman who has bilateral lower extremity lymphedema and [name] history of difficult to treat bilateral lower extremity wounds. When she was here last time we had her seen by [name] lymphedema specialist. She had lower extremity compression stockings as well as compression garments for her thighs. She has compression pumps as well. Unfortunately she has not been  doing well. She describes unrelenting nausea with vomiting anytime she tries to eat or drink or take her medications. She was seen in urgent care on 10/26. Notably they found her to be somewhat dehydrated. Gave her [name] liter of fluid. Her creatinine was up to 2.42. The rest of her lab work including liver function tests, white count, hemoglobin were all normal. She did have [name] urine for culture that showed  Klebsiella she was prescribed ciprofloxacin but I seriously doubt that she is able to keep this down by her description. She has not taken any of her meds in over [name] month. She states she feels too weak to take care of her lower extremities with compression garments and her external compression pumps she has not been using any of these. As [name] result she has 2 large areas on the left lateral calf one anteriorly [name] cluster of 3 on the right lateral and [name] small area on her right ankle. All of these are superficial. She has poorly controlled lymphedema especially on the anterior tibial areas however by my my anxiety she actually looks like she has less swelling than what I remember. Past medical history includes type 2 diabetes with [name] recent hemoglobin A1c of 6.8 chronic venous insufficiency, lymphedema which she was managing with above the waist compression and compression pumps, diastolic congestive heart failure. Nausea and vomiting as described above. We did not repeat her arterial studies today however these were quite normal when they were last done in 2018 with an ABI of 1.21 on the right and 1.35 on the left. TBI's were within normal limits at 0.76 and 0.75 she had triphasic waveforms on the left monophasic waveforms at the posterior tibial but triphasic waveforms at the dorsalis pedis. She has previously tolerated 4 layer compression 11/18; the patient has [name] cluster of wounds on the right lateral lower calf and areas on the left lateral calf. We have been using silver alginate under compression she seems to be tolerating this well. Her edema is improved. HOWEVER she arrives in clinic today still with [name] description of bilious vomiting that is making it impossible for her to eat and drink. Her vitals are stable however she looks more gaunt. I have tried to talk her into going to the emergency room to be checked out however she will not hear anything of it 11/29; the patient's right leg is closed. On  the left leg she has 2 small superficial open areas anteriorly and one just to the lateral part of the tibia. These are just about closed. She did go to the ER with regards to her persistent nausea and vomiting. Apparently they did not find anything and did [name] CT scan of her abdomen although I have not yet had [name] chance to review these records Readmission: 10/16/2020 upon evaluation today patient presents for reevaluation here in the clinic due to issues that she is can have one of the bilateral lower extremities. More importantly she has an issue which looks to be more of [name] friction injury to be honest in the posterior upper thigh on the left. Again this appears to potentially be where [name] brief could have been pulled [name] little bit causing [name] friction injury. Again it does not look like [name] pressure injury at all very superficial not really draining much at all. Fortunately there does not appear to be any signs of active infection at this time which is great news. No fevers, chills, nausea, vomiting, or diarrhea. Patient does have  [name] history of bilateral lower extremity lymphedema, hypertension, congestive heart failure, and long-term use of anticoagulant therapy. She is currently on Eliquis. Unfortunately since we last seen her she apparently ended up apparently fallen in her home and she was subsequently found on the ground by her daughter admitted to the hospital for altered mental status and weakness she was found to have [name] UTI and Warnicke's encephalopathy. Upon discharge mental status had improved she had fevers which were thought to be potentially due to pseudogout flare. She did improve with steroids. She also had an EGD which showed reflux esophagitis and multiple large and small nonbleeding duodenal ulcers. She was discharged on 04/08/2020 to [name] for [facility] she has been there since. It was her daughter who actually found her as she was not able to get in touch with her mom. For that reason she called  the police for [name] safety check and found her lying on the floor covered with stool and bugs crawling all over her. She was seen by her PCP for intractable nausea and vomiting 4 to 6 weeks prior. She was found to have profound weakness which honestly has not really improved much at this point. Her CT scan was negative. 8/31; the patient returns to clinic now at [name] local nursing home I believe on [place]. It sounds as though she is been discharged for rehab and is now [name] chronic care patient in the facility. 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 She is cognitively intact. She comes in the clinic with the wound on her left medial upper thigh. She has kerlix Coban wraps on her bilateral lower extremities and [name] vague history of wound on her buttock. Supposed to be using Xeroform according to [facility] last notes from July although she came in with nothing on the thigh wound today 9/28; patient comes in today with the area on her left medial upper thigh posteriorly actually quite [name] bit worse than last time. When she was here last month she had [name] single open area she comes in today with multiple open areas and denuded skin some of the areas look like reniform purpura however on closer inspection these look like blisters. There was denuded skin hanging off these areas. I think they are using silver alginate not sure what they are putting on top of this. The patient tells me that she is not much up in the wheelchair she sits on the side of her bed for long periods as [name] result. [name] lot of this is because of lower extremity edema Objective Constitutional Vitals Time Taken: 9:43 AM, Respiratory Rate: 17 breaths/min. Integumentary (Hair, Skin) Wound #47 status is Open. Original cause of wound was Gradually Appeared. The date acquired was: 10/16/2020. The wound has been in treatment 11 weeks. The wound is located on the Left,Posterior Upper Leg. The  wound measures 5cm length x 9cm width x 0.1cm depth; 35.343cm^2 area and 3.534cm^3 volume. There is Fat Layer (Subcutaneous Tissue) exposed. There is no tunneling or undermining noted. There is [name] medium amount of serosanguineous drainage noted. The wound margin is distinct with the outline attached to the wound base. There is large (67-100%) red granulation within the wound bed. There is [name] small (1-33%) amount of necrotic tissue within the wound bed. Assessment Active Problems ICD-10 Lymphedema, not elsewhere classified Other specified soft tissue disorders Non-pressure chronic ulcer of left thigh with fat layer exposed Essential (primary) hypertension Chronic combined systolic (  congestive) and diastolic (congestive) heart failure Long term (current) use of anticoagulants Procedures 1. I am continuing to think that this is largely [name] friction issue although I must admit I am [name] lot less certain about this. 2. I asked the facility to let us know if this patient is developing additional areas of this pattern of blistering. 3 we are going to dressed this entire area with calcium alginate ABDs and kerlix. I spoke to the patient about leaving this area as [name] weightbearing surface when she is sitting up in her bed for example. 4. I am hoping that the issue is as simple as this. I was somewhat concerned by the possibility of an underlying skin issueo Blistering skin disease and I have asked the facility to do [name] skin check for this. I will also need to look back at her past medical history, we have always just looked at her from the point of view of bilateral lower extremity wounds with lymphedema Plan Follow-up Appointments: Return appointment in 1 month. - ***75 minutes [facility] life**** Dr. Dellia Nims Other: - Patient resides at [facility]. Please let [place] know if pt. gets any other wounds (i.e. legs or buttocks). Bathing/ Shower/ Hygiene: May shower and wash wound with soap and water. Edema Control  - Lymphedema / SCD / Other: Elevate legs to the level of the heart or above for 30 minutes daily and/or when sitting, [name] frequency of: Moisturize legs daily. Other Edema Control Orders/Instructions: - Apply Kerlix/Coban 2 layer wrap to bilateral legs, change 2x week.4 [facility] can order compression stockings 20- 2mm/Hg and pt. can wear those instead of the kerlix/coban wrap.e Off-Loading: Other: - Try to minimize friction/shearing to area Additional Orders / Instructions: Follow Nutritious Diet WOUND #47: - Upper Leg Wound Laterality: Left, Posterior Cleanser: Soap and Water 3 x Per Week/30 Days Discharge Instructions: May shower and wash wound with dial antibacterial soap and water prior to dressing change. Cleanser: Wound Cleanser 3 x Per Week/30 Days Discharge Instructions: Cleanse the wound with wound cleanser prior to applying [name] clean dressing using gauze sponges, not tissue or cotton balls. Prim Dressing: Maxorb Extra Calcium Alginate Dressing, 4x4 in 3 x Per Week/30 Days ary Discharge Instructions: Apply calcium alginate to wound bed as instructed Secondary Dressing: ABD Pad, 5x9 3 x Per Week/30 Days Discharge Instructions: Apply over primary dressing as directed. Secured With: [employer], 4.5x3.1 (in/yd) 3 x Per Week/30 Days Discharge Instructions: Secure with Kerlix as directed. Secured With: 93M Medipore H Soft Cloth Surgical T 4 x 2 (in/yd) 3 x Per Week/30 Days ape Discharge Instructions: Secure dressing with tape as directed. Electronic Signature(s) Signed: 01/01/2021 5:00:15 PM By: Linton Ham MD Entered By: Linton Ham on 01/01/2021 11:00:28 -------------------------------------------------------------------------------- SuperBill Details Patient Name: Date of Service: [name] [name], [name] [name] 01/01/2021 Medical Record Number: [id number] Patient Account Number: [id number] Date of Birth/Sex: Treating RN: [birth year] (75 y.o. 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 I10 Essential (primary) hypertension I50.42 Chronic combined systolic (congestive) and diastolic (congestive) heart failure Z79.01 Long term (current) use of anticoagulants Facility Procedures CPT4 Code: [id number] Description: 99214 - WOUND CARE VISIT-LEV 4 EST PT Modifier: Quantity: 1 Physician Procedures : CPT4 Code Description Modifier [id number] - WC PHYS LEVEL 3 -  EST PT ICD-10 Diagnosis Description V79.150 Non-pressure chronic ulcer of left thigh with fat layer exposed I89.0 Lymphedema, not elsewhere classified Quantity: 1 Electronic Signature(s) Signed: 01/01/2021 5:00:15 PM By: Linton Ham MD Entered By: Linton Ham on 01/01/2021 11:00:52

## 2021-01-02 NOTE — Progress Notes (Signed)
BIRDIA, JAYCOX ([id number]) Visit Report for 01/01/2021 Arrival Information Details Patient Name: Date of Service: FENNA, SEMEL 01/01/2021 9:30 A M Medical Record Number: [id number] Patient Account Number: [id number] Date of Birth/Sex: Treating RN: 08-21-1945 (75 y.o. Tonita Phoenix, Lauren Primary Care Tsugio Elison: [name] Other Clinician: Referring Bernon Arviso: Treating Marilla Boddy/Extender: [name] in Treatment: 11 Visit Information History Since Last Visit Added or deleted any medications: No Patient Arrived: Wheel Chair Any new allergies or adverse reactions: No Arrival Time: 09:39 Had a fall or experienced change in No Accompanied By: self activities of daily living that may affect Transfer Assistance: Harrel Lemon Lift risk of falls: Patient Identification Verified: Yes Signs or symptoms of abuse/neglect since last visito No Secondary Verification Process Completed: Yes Hospitalized since last visit: No Patient Requires Transmission-Based Precautions: No Implantable device outside of the clinic excluding No Patient Has Alerts: Yes cellular tissue based products placed in the center Patient Alerts: Patient on Blood Thinner since last visit: Has Dressing in Place as Prescribed: Yes Pain Present Now: Yes Electronic Signature(s) Signed: 01/01/2021 5:21:18 PM By: Rhae Hammock RN Entered By: Rhae Hammock on 01/01/2021 09:43:11 -------------------------------------------------------------------------------- Clinic Level of Care Assessment Details Patient Name: Date of Service: JISELL, MAJER 01/01/2021 9:30 [place] Record Number: [id number] Patient Account Number: [id number] Date of Birth/Sex: Treating RN: [birth year] (75 y.o. Tonita Phoenix, Lauren Primary Care Avier Jech: [name] Other Clinician: Referring Dreana Britz: Treating Kennedee Kitzmiller/Extender: [name] in Treatment: 11 Clinic Level  of Care Assessment Items TOOL 4 Quantity Score X- 1 0 Use when only an EandM is performed on FOLLOW-UP visit ASSESSMENTS - Nursing Assessment / Reassessment X- 1 10 Reassessment of Co-morbidities (includes updates in patient status) X- 1 5 Reassessment of Adherence to Treatment Plan ASSESSMENTS - Wound and Skin A ssessment / Reassessment X - Simple Wound Assessment / Reassessment - one wound 1 5 []  - 0 Complex Wound Assessment / Reassessment - multiple wounds []  - 0 Dermatologic / Skin Assessment (not related to wound area) ASSESSMENTS - Focused Assessment []  - 0 Circumferential Edema Measurements - multi extremities []  - 0 Nutritional Assessment / Counseling / Intervention []  - 0 Lower Extremity Assessment (monofilament, tuning fork, pulses) []  - 0 Peripheral Arterial Disease Assessment (using hand held doppler) ASSESSMENTS - Ostomy and/or Continence Assessment and Care []  - 0 Incontinence Assessment and Management []  - 0 Ostomy Care Assessment and Management (repouching, etc.) PROCESS - Coordination of Care []  - 0 Simple Patient / Family Education for ongoing care X- 1 20 Complex (extensive) Patient / Family Education for ongoing care X- 1 10 Staff obtains Programmer, systems, Records, T Results / Process Orders est X- 1 10 Staff telephones HHA, Nursing Homes / Clarify orders / etc []  - 0 Routine Transfer to another Facility (non-emergent condition) []  - 0 Routine Hospital Admission (non-emergent condition) []  - 0 New Admissions / Biomedical engineer / Ordering NPWT Apligraf, etc. , []  - 0 Emergency Hospital Admission (emergent condition) []  - 0 Simple Discharge Coordination X- 1 15 Complex (extensive) Discharge Coordination PROCESS - Special Needs []  - 0 Pediatric / Minor Patient Management []  - 0 Isolation Patient Management []  - 0 Hearing / Language / Visual special needs []  - 0 Assessment of Community assistance (transportation, D/C planning, etc.) []  -  0 Additional assistance / Altered mentation []  - 0 Support Surface(s) Assessment (bed, cushion, seat, etc.) INTERVENTIONS - Wound Cleansing / Measurement X - Simple Wound Cleansing - one wound 1 5 []  -  0 Complex Wound Cleansing - multiple wounds X- 1 5 Wound Imaging (photographs - any number of wounds) []  - 0 Wound Tracing (instead of photographs) X- 1 5 Simple Wound Measurement - one wound []  - 0 Complex Wound Measurement - multiple wounds INTERVENTIONS - Wound Dressings []  - 0 Small Wound Dressing one or multiple wounds X- 1 15 Medium Wound Dressing one or multiple wounds []  - 0 Large Wound Dressing one or multiple wounds []  - 0 Application of Medications - topical []  - 0 Application of Medications - injection INTERVENTIONS - Miscellaneous []  - 0 External ear exam []  - 0 Specimen Collection (cultures, biopsies, blood, body fluids, etc.) []  - 0 Specimen(s) / Culture(s) sent or taken to Lab for analysis X- 1 10 Patient Transfer (multiple staff / Civil Service fast streamer / Similar devices) []  - 0 Simple Staple / Suture removal (25 or less) []  - 0 Complex Staple / Suture removal (26 or more) []  - 0 Hypo / Hyperglycemic Management (close monitor of Blood Glucose) []  - 0 Ankle / Brachial Index (ABI) - do not check if billed separately X- 1 5 Vital Signs Has the patient been seen at the hospital within the last three years: Yes Total Score: 120 Level Of Care: New/Established - Level 4 Electronic Signature(s) Signed: 01/01/2021 5:21:18 PM By: Rhae Hammock RN Entered By: Rhae Hammock on 01/01/2021 10:37:46 -------------------------------------------------------------------------------- Encounter Discharge Information Details Patient Name: Date of Service: Champeau, [name] 01/01/2021 9:30 A M Medical Record Number: [id number] Patient Account Number: [id number] Date of Birth/Sex: Treating RN: November 16, 1945 (75 y.o. Tonita Phoenix, Lauren Primary Care Darika Ildefonso: [name] Other Clinician: Referring Tobiah Celestine: Treating Eon Zunker/Extender: [name] in Treatment: 11 Encounter Discharge Information Items Discharge Condition: Stable Ambulatory Status: Wheelchair Discharge Destination: Skilled Nursing Facility Telephoned: No Orders Sent: Yes Transportation: Other Accompanied By: self Schedule Follow-up Appointment: Yes Clinical Summary of Care: Patient Declined Electronic Signature(s) Signed: 01/01/2021 5:21:18 PM By: Rhae Hammock RN Entered By: Rhae Hammock on 01/01/2021 10:38:57 -------------------------------------------------------------------------------- Lower Extremity Assessment Details Patient Name: Date of Service: Wallner, [name] 01/01/2021 9:30 A M Medical Record Number: [id number] Patient Account Number: [id number] Date of Birth/Sex: Treating RN: 1945-06-21 (75 y.o. Tonita Phoenix, Lauren Primary Care Gerard Bonus: [name] Other Clinician: Referring Ervie Mccard: Treating Nasiah Lehenbauer/Extender: [name] in Treatment: 11 Edema Assessment Assessed: Shirlyn Goltz: Yes] Patrice Paradise: Yes] Edema: [Left: Yes] [Right: Yes] Calf Left: Right: Point of Measurement: 38 cm From Medial Instep 44 cm 46 cm Ankle Left: Right: Point of Measurement: 11 cm From Medial Instep 38 cm 30 cm Vascular Assessment Pulses: Dorsalis Pedis Palpable: [Left:Yes] [Right:Yes] Posterior Tibial Palpable: [Left:Yes] [Right:Yes] Electronic Signature(s) Signed: 01/01/2021 5:21:18 PM By: Rhae Hammock RN Entered By: Rhae Hammock on 01/01/2021 09:46:15 -------------------------------------------------------------------------------- Multi Wound Chart Details Patient Name: Date of Service: Roycroft, [name] 01/01/2021 9:30 A M Medical Record Number: [id number] Patient Account Number: [id number] Date of Birth/Sex: Treating RN: [birth year] (75 y.o. [name] Primary Care Dmauri Rosenow:  [name] Other Clinician: Referring Sidhant Helderman: Treating Rozanne Heumann/Extender: [name] in Treatment: 11 Vital Signs Height(in): Pulse(bpm): Weight(lbs): Blood Pressure(mmHg): Body Mass Index(BMI): Temperature(F): Respiratory Rate(breaths/min): 17 Photos: [N/A:N/A] Left, Posterior Upper Leg N/A N/A Wound Location: Gradually Appeared N/A N/A Wounding Event: Abrasion N/A N/A Primary Etiology: Cataracts, Lymphedema, Arrhythmia, N/A N/A Comorbid History: Congestive Heart Failure, Hypertension, Peripheral Venous Disease, Type II Diabetes, Osteoarthritis, Neuropathy 10/16/2020 N/A N/A Date Acquired: 11 N/A N/A Weeks of Treatment: Open N/A N/A Wound Status: Yes  N/A N/A Clustered Wound: 5x9x0.1 N/A N/A Measurements L x W x D (cm) 35.343 N/A N/A A (cm) : rea 3.534 N/A N/A Volume (cm) : -1775.00% N/A N/A % Reduction in Area: -1779.80% N/A N/A % Reduction in Volume: Full Thickness Without Exposed N/A N/A Classification: Support Structures Medium N/A N/A Exudate Amount: Serosanguineous N/A N/A Exudate Type: red, brown N/A N/A Exudate Color: Distinct, outline attached N/A N/A Wound Margin: Large (67-100%) N/A N/A Granulation Amount: Red N/A N/A Granulation Quality: Small (1-33%) N/A N/A Necrotic Amount: Fat Layer (Subcutaneous Tissue): Yes N/A N/A Exposed Structures: Fascia: No Tendon: No Muscle: No Joint: No Bone: No None N/A N/A Epithelialization: Treatment Notes Wound #47 (Upper Leg) Wound Laterality: Left, Posterior Cleanser Soap and Water Discharge Instruction: May shower and wash wound with dial antibacterial soap and water prior to dressing change. Wound Cleanser Discharge Instruction: Cleanse the wound with wound cleanser prior to applying a clean dressing using gauze sponges, not tissue or cotton balls. Peri-Wound Care Topical Primary Dressing Maxorb Extra Calcium Alginate Dressing, 4x4  in Discharge Instruction: Apply calcium alginate to wound bed as instructed Secondary Dressing ABD Pad, 5x9 Discharge Instruction: Apply over primary dressing as directed. Secured With [employer], 4.5x3.1 (in/yd) Discharge Instruction: Secure with Kerlix as directed. 33M Medipore H Soft Cloth Surgical T 4 x 2 (in/yd) ape Discharge Instruction: Secure dressing with tape as directed. Compression Wrap Compression Stockings Add-Ons Electronic Signature(s) Signed: 01/01/2021 5:00:15 PM By: Linton Ham MD Signed: 01/02/2021 4:49:13 PM By: Levan Hurst RN, BSN Entered By: Linton Ham on 01/01/2021 10:52:17 -------------------------------------------------------------------------------- Multi-Disciplinary Care Plan Details Patient Name: Date of Service: MARISAH, LAKER 01/01/2021 9:30 A M Medical Record Number: [id number] Patient Account Number: [id number] Date of Birth/Sex: Treating RN: Jun 08, 1945 (75 y.o. Tonita Phoenix, Lauren Primary Care Macarena Langseth: [name] Other Clinician: Referring Hydie Langan: Treating Amer Alcindor/Extender: [name] in Treatment: 11 Active Inactive Wound/Skin Impairment Nursing Diagnoses: Impaired tissue integrity Goals: Patient/caregiver will verbalize understanding of skin care regimen Date Initiated: 10/16/2020 Target Resolution Date: 12/07/2020 Goal Status: Active Ulcer/skin breakdown will have a volume reduction of 30% by week 4 Date Initiated: 10/16/2020 Target Resolution Date: 12/07/2020 Goal Status: Active Interventions: Assess patient/caregiver ability to obtain necessary supplies Assess patient/caregiver ability to perform ulcer/skin care regimen upon admission and as needed Assess ulceration(s) every visit Provide education on ulcer and skin care Treatment Activities: Skin care regimen initiated : 10/16/2020 Topical wound management initiated : 10/16/2020 Notes: Electronic  Signature(s) Signed: 01/01/2021 5:21:18 PM By: Rhae Hammock RN Entered By: Rhae Hammock on 01/01/2021 10:15:47 -------------------------------------------------------------------------------- Pain Assessment Details Patient Name: Date of Service: KANOE, WANNER 01/01/2021 9:30 A M Medical Record Number: [id number] Patient Account Number: [id number] Date of Birth/Sex: Treating RN: 27-Feb-1946 (75 y.o. Tonita Phoenix, Lauren Primary Care Karee Christopherson: [name] Other Clinician: Referring Melek Pownall: Treating Kely Dohn/Extender: [name] in Treatment: 11 Active Problems Location of Pain Severity and Description of Pain Patient Has Paino Yes Site Locations Pain Location: Generalized Pain, Pain in Ulcers With Dressing Change: Yes Duration of the Pain. Constant / Intermittento Intermittent Rate the pain. Current Pain Level: 8 Worst Pain Level: 10 Least Pain Level: 0 Tolerable Pain Level: 8 Character of Pain Describe the Pain: Aching Pain Management and Medication Current Pain Management: Medication: No Cold Application: No Rest: No Massage: No Activity: No T.E.N.S.: No Heat Application: No Leg drop or elevation: No Is the Current Pain Management Adequate: Adequate How does your wound impact your activities of daily  livingo Sleep: No Bathing: No Appetite: No Relationship With Others: No Bladder Continence: No Emotions: No Bowel Continence: No Work: No Toileting: No Drive: No Dressing: No Hobbies: No Electronic Signature(s) Signed: 01/01/2021 5:21:18 PM By: Rhae Hammock RN Entered By: Rhae Hammock on 01/01/2021 09:44:00 -------------------------------------------------------------------------------- Patient/Caregiver Education Details Patient Name: Date of Service: Depierro, [name] 9/28/2022andnbsp9:30 A M Medical Record Number: [id number] Patient Account Number: [id number] Date of Birth/Gender: Treating  RN: [birth year] (75 y.o. Tonita Phoenix, Lauren Primary Care Physician: [name] Other Clinician: Referring Physician: Treating Physician/Extender: [name] in Treatment: 11 Education Assessment Education Provided To: Patient Education Topics Provided Wound/Skin Impairment: Methods: Explain/Verbal Responses: State content correctly [employer]) Signed: 01/01/2021 5:21:18 PM By: Rhae Hammock RN Entered By: Rhae Hammock on 01/01/2021 10:16:26 -------------------------------------------------------------------------------- Wound Assessment Details Patient Name: Date of Service: Steinhoff, [name] 01/01/2021 9:30 A M Medical Record Number: [id number] Patient Account Number: [id number] Date of Birth/Sex: Treating RN: 05-Dec-1945 (75 y.o. Tonita Phoenix, Lauren Primary Care Yamira Papa: [name] Other Clinician: Referring Sullivan Blasing: Treating Emery Dupuy/Extender: [name] in Treatment: 11 Wound Status Wound Number: 47 Primary Abrasion Etiology: Wound Location: Left, Posterior Upper Leg Wound Open Wounding Event: Gradually Appeared Status: Date Acquired: 10/16/2020 Comorbid Cataracts, Lymphedema, Arrhythmia, Congestive Heart Failure, Weeks Of Treatment: 11 History: Hypertension, Peripheral Venous Disease, Type II Diabetes, Clustered Wound: Yes Osteoarthritis, Neuropathy Photos Wound Measurements Length: (cm) 5 Width: (cm) 9 Depth: (cm) 0.1 Area: (cm) 35.343 Volume: (cm) 3.534 % Reduction in Area: -1775% % Reduction in Volume: -1779.8% Epithelialization: None Tunneling: No Undermining: No Wound Description Classification: Full Thickness Without Exposed Support Structures Wound Margin: Distinct, outline attached Exudate Amount: Medium Exudate Type: Serosanguineous Exudate Color: red, brown Foul Odor After Cleansing: No Slough/Fibrino Yes Wound Bed Granulation Amount:  Large (67-100%) Exposed Structure Granulation Quality: Red Fascia Exposed: No Necrotic Amount: Small (1-33%) Fat Layer (Subcutaneous Tissue) Exposed: Yes Tendon Exposed: No Muscle Exposed: No Joint Exposed: No Bone Exposed: No Treatment Notes Wound #47 (Upper Leg) Wound Laterality: Left, Posterior Cleanser Soap and Water Discharge Instruction: May shower and wash wound with dial antibacterial soap and water prior to dressing change. Wound Cleanser Discharge Instruction: Cleanse the wound with wound cleanser prior to applying a clean dressing using gauze sponges, not tissue or cotton balls. Peri-Wound Care Topical Primary Dressing Maxorb Extra Calcium Alginate Dressing, 4x4 in Discharge Instruction: Apply calcium alginate to wound bed as instructed Secondary Dressing ABD Pad, 5x9 Discharge Instruction: Apply over primary dressing as directed. Secured With [employer], 4.5x3.1 (in/yd) Discharge Instruction: Secure with Kerlix as directed. 55M Medipore H Soft Cloth Surgical T 4 x 2 (in/yd) ape Discharge Instruction: Secure dressing with tape as directed. Compression Wrap Compression Stockings Add-Ons Electronic Signature(s) Signed: 01/01/2021 11:11:37 AM By: Sandre Kitty Signed: 01/01/2021 5:21:18 PM By: Rhae Hammock RN Entered By: Sandre Kitty on 01/01/2021 10:00:42 -------------------------------------------------------------------------------- Vitals Details Patient Name: Date of Service: Polito, [name] 01/01/2021 9:30 A M Medical Record Number: [id number] Patient Account Number: [id number] Date of Birth/Sex: Treating RN: Jan 20, 1946 (75 y.o. Tonita Phoenix, Lauren Primary Care Bathsheba Durrett: [name] Other Clinician: Referring Addaline Peplinski: Treating Alva Broxson/Extender: [name] in Treatment: 11 Vital Signs Time Taken: 09:43 Respiratory Rate (breaths/min): 17 Reference Range: 80 - 120 mg / dl Electronic  Signature(s) Signed: 01/01/2021 5:21:18 PM By: Rhae Hammock RN Entered By: Rhae Hammock on 01/01/2021 09:43:24

## 2021-01-08 ENCOUNTER — Telehealth: Payer: Self-pay | Admitting: Internal Medicine

## 2021-01-08 NOTE — Telephone Encounter (Signed)
   Patient currently at [facility]. She is requesting FL2 be completed to provided to [employer] and [employer] Patient states she can not walk, uses wheelchair.  Please call

## 2021-01-09 NOTE — Telephone Encounter (Signed)
Called patient. LVM asking her to call our office to discuss exactly what she needs. Office number was provided

## 2021-01-09 NOTE — Telephone Encounter (Signed)
This note is confusing, does she want FL2 (to change facility) or SCAT forms filled out (application for transportation assistance)? They are different things.

## 2021-01-23 DIAGNOSIS — K219 Gastro-esophageal reflux disease without esophagitis: Secondary | ICD-10-CM | POA: Diagnosis not present

## 2021-01-23 DIAGNOSIS — I10 Essential (primary) hypertension: Secondary | ICD-10-CM | POA: Diagnosis not present

## 2021-01-23 DIAGNOSIS — N1831 Chronic kidney disease, stage 3a: Secondary | ICD-10-CM | POA: Diagnosis not present

## 2021-01-23 DIAGNOSIS — E119 Type 2 diabetes mellitus without complications: Secondary | ICD-10-CM | POA: Diagnosis not present

## 2021-01-23 DIAGNOSIS — R5381 Other malaise: Secondary | ICD-10-CM | POA: Diagnosis not present

## 2021-01-23 DIAGNOSIS — D649 Anemia, unspecified: Secondary | ICD-10-CM | POA: Diagnosis not present

## 2021-01-24 DIAGNOSIS — R419 Unspecified symptoms and signs involving cognitive functions and awareness: Secondary | ICD-10-CM | POA: Diagnosis not present

## 2021-01-24 DIAGNOSIS — F331 Major depressive disorder, recurrent, moderate: Secondary | ICD-10-CM | POA: Diagnosis not present

## 2021-01-25 DIAGNOSIS — B9620 Unspecified Escherichia coli [E. coli] as the cause of diseases classified elsewhere: Secondary | ICD-10-CM | POA: Diagnosis not present

## 2021-01-25 DIAGNOSIS — N390 Urinary tract infection, site not specified: Secondary | ICD-10-CM | POA: Diagnosis not present

## 2021-01-27 DIAGNOSIS — R5381 Other malaise: Secondary | ICD-10-CM | POA: Diagnosis not present

## 2021-01-27 DIAGNOSIS — I10 Essential (primary) hypertension: Secondary | ICD-10-CM | POA: Diagnosis not present

## 2021-01-27 DIAGNOSIS — R82998 Other abnormal findings in urine: Secondary | ICD-10-CM | POA: Diagnosis not present

## 2021-01-27 DIAGNOSIS — N390 Urinary tract infection, site not specified: Secondary | ICD-10-CM | POA: Diagnosis not present

## 2021-01-27 DIAGNOSIS — E119 Type 2 diabetes mellitus without complications: Secondary | ICD-10-CM | POA: Diagnosis not present

## 2021-01-29 ENCOUNTER — Other Ambulatory Visit: Payer: Self-pay

## 2021-01-29 ENCOUNTER — Encounter (HOSPITAL_BASED_OUTPATIENT_CLINIC_OR_DEPARTMENT_OTHER): Payer: PPO | Attending: Internal Medicine | Admitting: Internal Medicine

## 2021-01-29 DIAGNOSIS — I890 Lymphedema, not elsewhere classified: Secondary | ICD-10-CM | POA: Insufficient documentation

## 2021-01-29 DIAGNOSIS — Z7901 Long term (current) use of anticoagulants: Secondary | ICD-10-CM | POA: Diagnosis not present

## 2021-01-29 DIAGNOSIS — L97222 Non-pressure chronic ulcer of left calf with fat layer exposed: Secondary | ICD-10-CM | POA: Diagnosis not present

## 2021-01-29 DIAGNOSIS — M7989 Other specified soft tissue disorders: Secondary | ICD-10-CM | POA: Insufficient documentation

## 2021-01-29 DIAGNOSIS — L97122 Non-pressure chronic ulcer of left thigh with fat layer exposed: Secondary | ICD-10-CM | POA: Insufficient documentation

## 2021-01-29 DIAGNOSIS — I5042 Chronic combined systolic (congestive) and diastolic (congestive) heart failure: Secondary | ICD-10-CM | POA: Insufficient documentation

## 2021-01-29 DIAGNOSIS — I110 Hypertensive heart disease with heart failure: Secondary | ICD-10-CM | POA: Diagnosis not present

## 2021-01-30 DIAGNOSIS — N1831 Chronic kidney disease, stage 3a: Secondary | ICD-10-CM | POA: Diagnosis not present

## 2021-01-30 DIAGNOSIS — N390 Urinary tract infection, site not specified: Secondary | ICD-10-CM | POA: Diagnosis not present

## 2021-01-30 DIAGNOSIS — D649 Anemia, unspecified: Secondary | ICD-10-CM | POA: Diagnosis not present

## 2021-01-30 DIAGNOSIS — E119 Type 2 diabetes mellitus without complications: Secondary | ICD-10-CM | POA: Diagnosis not present

## 2021-01-30 DIAGNOSIS — E870 Hyperosmolality and hypernatremia: Secondary | ICD-10-CM | POA: Diagnosis not present

## 2021-01-30 NOTE — Progress Notes (Signed)
[name] [name], [name] [name] ([id number]) Visit Report for 01/29/2021 Arrival Information Details Patient Name: Date of Service: [name] [name], [name] [name] 01/29/2021 9:30 [name] M Medical Record Number: [id number] Patient Account Number: [id number] Date of Birth/Sex: Treating RN: [birth year] (75 y.o. [name] Primary Care Miara Emminger: [name] Other Clinician: Referring Ociel Retherford: Treating Vanita Cannell/Extender: [name] in Treatment: 15 Visit Information History Since Last Visit Added or deleted any medications: No Patient Arrived: Wheel Chair Any new allergies or adverse reactions: No Arrival Time: 10:01 Had [name] fall or experienced change in No Accompanied By: alone activities of daily living that may affect Transfer Assistance: Harrel Lemon Lift risk of falls: Patient Identification Verified: Yes Signs or symptoms of abuse/neglect since last visito No Secondary Verification Process Completed: Yes Hospitalized since last visit: No Patient Requires Transmission-Based Precautions: No Implantable device outside of the clinic excluding No Patient Has Alerts: Yes cellular tissue based products placed in the center Patient Alerts: Patient on Blood Thinner since last visit: Has Dressing in Place as Prescribed: Yes Has Compression in Place as Prescribed: Yes Pain Present Now: No Electronic Signature(s) Signed: 01/30/2021 5:45:17 PM By: Levan Hurst RN, BSN Entered By: Levan Hurst on 01/29/2021 10:02:08 -------------------------------------------------------------------------------- Clinic Level of Care Assessment Details Patient Name: Date of Service: [name] [name], [name] [name] 01/29/2021 9:30 [name] M Medical Record Number: [id number] Patient Account Number: [id number] Date of Birth/Sex: Treating RN: [birth year] (75 y.o. [name] Primary Care Jerelle Virden: [name] Other Clinician: Referring Niasha Devins: Treating Roemello Speyer/Extender: [name] in Treatment: 15 Clinic Level of Care Assessment Items TOOL 4 Quantity Score X- 1 0 Use when only an EandM is performed on FOLLOW-UP visit ASSESSMENTS - Nursing Assessment / Reassessment X- 1 10 Reassessment of Co-morbidities (includes updates in patient status) X- 1 5 Reassessment of Adherence to Treatment Plan ASSESSMENTS - Wound and Skin [name] ssessment / Reassessment X - Simple Wound Assessment / Reassessment - one wound 1 5 []  - 0 Complex Wound Assessment / Reassessment - multiple wounds []  - 0 Dermatologic / Skin Assessment (not related to wound area) ASSESSMENTS - Focused Assessment []  - 0 Circumferential Edema Measurements - multi extremities []  - 0 Nutritional Assessment / Counseling / Intervention X- 1 5 Lower Extremity Assessment (monofilament, tuning fork, pulses) []  - 0 Peripheral Arterial Disease Assessment (using hand held doppler) ASSESSMENTS - Ostomy and/or Continence Assessment and Care []  - 0 Incontinence Assessment and Management []  - 0 Ostomy Care Assessment and Management (repouching, etc.) PROCESS - Coordination of Care X - Simple Patient / Family Education for ongoing care 1 15 []  - 0 Complex (extensive) Patient / Family Education for ongoing care X- 1 10 Staff obtains Programmer, systems, Records, T Results / Process Orders est X- 1 10 Staff telephones HHA, Nursing Homes / Clarify orders / etc []  - 0 Routine Transfer to another Facility (non-emergent condition) []  - 0 Routine Hospital Admission (non-emergent condition) []  - 0 New Admissions / Biomedical engineer / Ordering NPWT Apligraf, etc. , []  - 0 Emergency Hospital Admission (emergent condition) X- 1 10 Simple Discharge Coordination []  - 0 Complex (extensive) Discharge Coordination PROCESS - Special Needs []  - 0 Pediatric / Minor Patient Management []  - 0 Isolation Patient Management []  - 0 Hearing / Language / Visual special needs []  - 0 Assessment of Community  assistance (transportation, D/C planning, etc.) []  - 0 Additional assistance / Altered mentation []  - 0 Support Surface(s) Assessment (bed, cushion, seat, etc.) INTERVENTIONS - Wound Cleansing / Measurement X - Simple  Wound Cleansing - one wound 1 5 []  - 0 Complex Wound Cleansing - multiple wounds X- 1 5 Wound Imaging (photographs - any number of wounds) []  - 0 Wound Tracing (instead of photographs) X- 1 5 Simple Wound Measurement - one wound []  - 0 Complex Wound Measurement - multiple wounds INTERVENTIONS - Wound Dressings []  - 0 Small Wound Dressing one or multiple wounds []  - 0 Medium Wound Dressing one or multiple wounds []  - 0 Large Wound Dressing one or multiple wounds []  - 0 Application of Medications - topical []  - 0 Application of Medications - injection INTERVENTIONS - Miscellaneous []  - 0 External ear exam []  - 0 Specimen Collection (cultures, biopsies, blood, body fluids, etc.) []  - 0 Specimen(s) / Culture(s) sent or taken to Lab for analysis X- 1 10 Patient Transfer (multiple staff / Civil Service fast streamer / Similar devices) []  - 0 Simple Staple / Suture removal (25 or less) []  - 0 Complex Staple / Suture removal (26 or more) []  - 0 Hypo / Hyperglycemic Management (close monitor of Blood Glucose) []  - 0 Ankle / Brachial Index (ABI) - do not check if billed separately X- 1 5 Vital Signs Has the patient been seen at the hospital within the last three years: Yes Total Score: 100 Level Of Care: New/Established - Level 3 Electronic Signature(s) Signed: 01/30/2021 5:45:17 PM By: Levan Hurst RN, BSN Entered By: Levan Hurst on 01/29/2021 11:51:53 -------------------------------------------------------------------------------- Encounter Discharge Information Details Patient Name: Date of Service: [name] [name], [name] [name] 01/29/2021 9:30 [name] M Medical Record Number: [id number] Patient Account Number: [id number] Date of Birth/Sex: Treating RN: 04/29/1945 (75 y.o. [name] Primary Care Garrus Gauthreaux: [name] Other Clinician: Referring Rajveer Handler: Treating Rajvir Ernster/Extender: [name] in Treatment: 15 Encounter Discharge Information Items Discharge Condition: Stable Ambulatory Status: Wheelchair Discharge Destination: Home Transportation: Private Auto Accompanied By: alone Schedule Follow-up Appointment: Yes Clinical Summary of Care: Patient Declined Electronic Signature(s) Signed: 01/30/2021 5:45:17 PM By: Levan Hurst RN, BSN Entered By: Levan Hurst on 01/29/2021 11:52:41 -------------------------------------------------------------------------------- Lower Extremity Assessment Details Patient Name: Date of Service: [name] [name], [name] [name] 01/29/2021 9:30 [name] M Medical Record Number: [id number] Patient Account Number: [id number] Date of Birth/Sex: Treating RN: [birth year] (75 y.o. [name] Primary Care Orbie Grupe: [name] Other Clinician: Referring Latroya Ng: Treating Chaniya Genter/Extender: [name] in Treatment: 15 Edema Assessment Assessed: Shirlyn Goltz: No] Patrice Paradise: No] Edema: [Left: Yes] [Right: Yes] Calf Left: Right: Point of Measurement: 38 cm From Medial Instep 56 cm 47.4 cm Ankle Left: Right: Point of Measurement: 11 cm From Medial Instep 29.5 cm 28.1 cm Vascular Assessment Pulses: Dorsalis Pedis Palpable: [Left:Yes] [Right:Yes] Electronic Signature(s) Signed: 01/30/2021 5:45:17 PM By: Levan Hurst RN, BSN Entered By: Levan Hurst on 01/29/2021 10:05:28 -------------------------------------------------------------------------------- Multi Wound Chart Details Patient Name: Date of Service: [name] [name], [name] [name] 01/29/2021 9:30 [name] M Medical Record Number: [id number] Patient Account Number: [id number] Date of Birth/Sex: Treating RN: Nov 26, 1945 (75 y.o. [name] Primary Care Jammal Sarr: [name] Other Clinician: Referring  Macon Lesesne: Treating Breckyn Ticas/Extender: [name] in Treatment: 15 Vital Signs Height(in): Pulse(bpm): 106 Weight(lbs): Blood Pressure(mmHg): 165/83 Body Mass Index(BMI): Temperature(F): 98.7 Respiratory Rate(breaths/min): 18 Photos: [47:No Photos Left, Posterior Upper Leg] [N/[name]:N/[name] N/[name]] Wound Location: [47:Gradually Appeared] [N/[name]:N/[name]] Wounding Event: [47:Abrasion] [N/[name]:N/[name]] Primary Etiology: [47:Cataracts, Lymphedema, Arrhythmia,] [N/[name]:N/[name]] Comorbid History: [47:Congestive Heart Failure, Hypertension, Peripheral Venous Disease, Type II Diabetes, Osteoarthritis, Neuropathy 10/16/2020] [N/[name]:N/[name]] Date Acquired: [78:46] [N/[name]:N/[name]] Weeks of Treatment: [47:Healed - Epithelialized] [N/[name]:N/[name]] Wound Status: [  47:Yes] [N/[name]:N/[name]] Clustered Wound: [47:0x0x0] [N/[name]:N/[name]] Measurements L x W x D (cm) [47:0] [N/[name]:N/[name]] [name] (cm) : rea [47:0] [N/[name]:N/[name]] Volume (cm) : [47:100.00%] [N/[name]:N/[name]] % Reduction in Area: [47:100.00%] [N/[name]:N/[name]] % Reduction in Volume: [47:Full Thickness Without Exposed] [N/[name]:N/[name]] Classification: [47:Support Structures None Present] [N/[name]:N/[name]] Exudate Amount: [47:Distinct, outline attached] [N/[name]:N/[name]] Wound Margin: [47:None Present (0%)] [N/[name]:N/[name]] Granulation Amount: [47:None Present (0%)] [N/[name]:N/[name]] Necrotic Amount: [47:Fascia: No] [N/[name]:N/[name]] Exposed Structures: [47:Fat Layer (Subcutaneous Tissue): No Tendon: No Muscle: No Joint: No Bone: No Large (67-100%)] [N/[name]:N/[name]] Treatment Notes Electronic Signature(s) Signed: 01/29/2021 4:58:10 PM By: Linton Ham MD Signed: 01/30/2021 5:45:17 PM By: Levan Hurst RN, BSN Entered By: Linton Ham on 01/29/2021 10:22:41 -------------------------------------------------------------------------------- Multi-Disciplinary Care Plan Details Patient Name: Date of Service: [name] [name], [name] [name] 01/29/2021 9:30 [name] M Medical Record Number: [id number] Patient Account Number: [id number] Date of Birth/Sex: Treating  RN: [birth year] (75 y.o. [name] Primary Care Calinda Stockinger: [name] Other Clinician: Referring Lemoine Goyne: Treating Arneshia Ade/Extender: [name] in Treatment: 15 Active Inactive Electronic Signature(s) Signed: 01/30/2021 5:45:17 PM By: Levan Hurst RN, BSN Entered By: Levan Hurst on 01/29/2021 11:51:18 -------------------------------------------------------------------------------- Pain Assessment Details Patient Name: Date of Service: [name] [name], [name] [name] 01/29/2021 9:30 [name] M Medical Record Number: [id number] Patient Account Number: [id number] Date of Birth/Sex: Treating RN: [birth year] (75 y.o. [name] Primary Care Larue Lightner: [name] Other Clinician: Referring Garcia Dalzell: Treating Ernestine Langworthy/Extender: [name] in Treatment: 15 Active Problems Location of Pain Severity and Description of Pain Patient Has Paino No Site Locations Pain Management and Medication Current Pain Management: Electronic Signature(s) Signed: 01/30/2021 5:45:17 PM By: Levan Hurst RN, BSN Entered By: Levan Hurst on 01/29/2021 10:02:36 -------------------------------------------------------------------------------- Patient/Caregiver Education Details Patient Name: Date of Service: [name] [name], [name] [name] 10/26/2022andnbsp9:30 [name] M Medical Record Number: [id number] Patient Account Number: [id number] Date of Birth/Gender: Treating RN: 1945/11/05 (75 y.o. [name] Primary Care Physician: [name] Other Clinician: Referring Physician: Treating Physician/Extender: [name] in Treatment: 15 Education Assessment Education Provided To: Patient Education Topics Provided Wound/Skin Impairment: Methods: Explain/Verbal Responses: State content correctly [employer]) Signed: 01/30/2021 5:45:17 PM By: Levan Hurst RN, BSN Entered By: Levan Hurst on 01/29/2021 11:51:29 -------------------------------------------------------------------------------- Wound Assessment Details Patient Name: Date of Service: [name] [name], [name] [name] 01/29/2021 9:30 [name] M Medical Record Number: [id number] Patient Account Number: [id number] Date of Birth/Sex: Treating RN: 01-Nov-1945 (75 y.o. [name] Primary Care Jennessy Sandridge: [name] Other Clinician: Referring Donaven Criswell: Treating Krisandra Bueno/Extender: [name] in Treatment: 15 Wound Status Wound Number: [id number] Primary Abrasion Etiology: Wound Location: Left, Posterior Upper Leg Wound Healed - Epithelialized Wounding Event: Gradually Appeared Status: Date Acquired: 10/16/2020 Comorbid Cataracts, Lymphedema, Arrhythmia, Congestive Heart Failure, Weeks Of Treatment: 15 History: Hypertension, Peripheral Venous Disease, Type II Diabetes, Clustered Wound: Yes Osteoarthritis, Neuropathy Wound Measurements Length: (cm) Width: (cm) Depth: (cm) Area: (cm) Volume: (cm) 0 % Reduction in Area: 100% 0 % Reduction in Volume: 100% 0 Epithelialization: Large (67-100%) 0 Tunneling: No 0 Undermining: No Wound Description Classification: Full Thickness Without Exposed Support Structures Wound Margin: Distinct, outline attached Exudate Amount: None Present Foul Odor After Cleansing: No Slough/Fibrino No Wound Bed Granulation Amount: None Present (0%) Exposed Structure Necrotic Amount: None Present (0%) Fascia Exposed: No Fat Layer (Subcutaneous Tissue) Exposed: No Tendon Exposed: No Muscle Exposed: No Joint Exposed: No Bone Exposed: No Electronic Signature(s) Signed: 01/30/2021 5:45:17 PM By: Levan Hurst RN, BSN Entered By: Levan Hurst on 01/29/2021 10:09:39 -------------------------------------------------------------------------------- Vitals Details Patient  Name: Date of Service: [name] [name], PARISH 01/29/2021 9:30 [name] M Medical Record Number:  [id number] Patient Account Number: [id number] Date of Birth/Sex: Treating RN: 08-02-1945 (75 y.o. [name] Primary Care Judah Carchi: [name] Other Clinician: Referring Marquelle Balow: Treating Jedadiah Abdallah/Extender: [name] in Treatment: 15 Vital Signs Time Taken: 10:01 Temperature (F): 98.7 Pulse (bpm): 106 Respiratory Rate (breaths/min): 18 Blood Pressure (mmHg): 165/83 Reference Range: 80 - 120 mg / dl Electronic Signature(s) Signed: 01/30/2021 5:45:17 PM By: Levan Hurst RN, BSN Entered By: Levan Hurst on 01/29/2021 10:02:30

## 2021-01-30 NOTE — Progress Notes (Signed)
[name], [name] ([id number]) Visit Report for 01/29/2021 HPI Details Patient Name: Date of Service: [name] [name], [name] [name] 01/29/2021 9:30 [name] M Medical Record Number: [id number] Patient Account Number: [id number] Date of Birth/Sex: Treating RN: July 31, 1945 (75 y.o. [name] Primary Care Provider: [name] Other Clinician: Referring Provider: Treating Provider/Extender: [name] in Treatment: 15 History of Present Illness HPI Description: 04/09/15; the patient is here for review of wounds on her bilateral lower extremities for the last month. The patient has [name] history of lymphedema and bilateral chronic venous insufficiency with inflammation she was here for review of wounds on her bilateral legs in 2014 she was discharged home with external compression pumps which she is currently not using. Over the last month she developed wounds on her bilateral lower legs with drainage and pain and she is here for our review of this. 04/16/15; the patient's edema is under much better control. She has 3 areas one on the right anterior medial leg one on the left posterior leg and [name] small open area with purulent drainage on the left anterior leg. I have cultured the area on the left anterior leg 04/23/15; continued improvement in the patient's edema. All her wounds of closed I see no open areas. Culture from the left anterior leg last time was negative. READMISSION 02/07/16; this patient is [name] woman that I saw her earlier in 2017. I believe we discharged her very quickly with bilateral wounds in her legs secondary to chronic venous hypertension with inflammation and secondary lymphedema. She had juxtalite stockings. She has external compression pumps at home from [name] stay in our clinic in 2014. She tells me that she's had wounds on her bilateral lower legs since March or April of this year. 3 wounds on the left 2 on the right. She is [name] type II diabetic and has [name]  history of noncompressible vessels bilaterally although she is tolerated 4-layer compression. The last note from her primary physician Dr. [name] was in May. At that point Dr. [name] had referred the patient here for lymphedema management. The patient stated she made [name] phone call to year but was not able to arrange an appointment. It is not clear that she is actually using anything on the wounds currently except some gauze that was saturated with drainage per our intake nurse. She is certainly not using her juxtalite stockings and by the patient's own admission she is not using her compression pumps because they are "irritating". I don't see [name] recent hemoglobin A1c. The patient states she has not been systemically unwell but the legs are painful 02/14/16; patient tolerated her Profore wraps reasonably well. She is using the compression pumps 1 time per day. Measurements of her legs are better in terms of the amount of lymphedema 02/21/16; patient tolerated her Profore wraps it. She says she is using her compression pumps one or 2 times [name] day. Measurements of her leg circumference are improved and most of her wound dimensions are better, unfortunately the home health that we referred her to did not except her insurance but works successful in getting her accompanied that would except her insurance however she apparently has [name] $25 co-pay which she cannot afford as she is on [name] fixed income 03/05/16; patient states she did not back using the pumps. She is healed that 2 wounds on the left leg. She still has areas on the right lateral lower leg, right medial lower leg and left lateral lower leg. All of these  look some better and have reduced in area 03/13/16; still 3 wounds 1 on the left lateral leg, one on the left medial/posterior leg which is really the most extensive and [name] small area on the right lateral leg. All of this in the setting of severe chronic lymphedema and chronic venous  inflammation. Damage to the skin with cobblestone thickened skin. 03/20/16; still 2 areas. The area on the left lateral leg appears to of closed over. The right medial leg is still most extensive wound although it appears to be closing over right lateral only [name] small open area remains. Using silver alginate Profore wraps 03/27/16; the area on the left leg remains closed. The edema control is better on the left than the right. The right medial leg is still weeping but everything appears to be epithelialized. The lateral aspect of the right leg appears closed. She tells me she has old juxtalite stockings. She has been compliant with her compression pumps. 04/03/16; the area on the left leg remains closed. She has ordered new juxtalite stockings. The right posterior medial wound is fully epithelialized. However she has an irritated area over the right Achilles that she states is been itching all week under the wraps.. She has been compliant with her compression pumps 04/10/16; the area on the left leg remains closed. The right posterior medial wound is also closed today. The irritated area over the right Achilles which was probably wrap injury is also fully epithelialized and closed. The patient has not received her new juxtalite stockings, she arrives in clinic today with an old juxtalite stocking in place. Sheis using her compression pumps secondary to her severe stage III lymphedema READMISSION 01/21/17; this is [name] patient we know from several previous stays in this clinic. She has lymphedema with chronic venous insufficiency and inflammation. The last time she was here we discharged her with bilateral juxta lites which she is compliant with. She also has external compression pumps at home from stays in our clinic in 2014 although she tells me that she is not very compliant with these as when she uses them [name] causes pain in her bilateral knees the patient states that her wounds on her left greater than  right leg including 2 large areas on the left anterior and left lateral leg and an area on the right leg open in late June or July since then she is been followed by [facility] and she's had bilateral Unna boot supplied every week for the last month.. I think the wounds have actually improved although they are not specifically dressed. She states she had an ultrasound to rule out DVT bilaterally that was negative. She does not have arterial studies although she is [name] diabetic. She states she has [name] lot of pain in her legs she states she does not use her external compression pumps because it causes pain in her knees. As usual her ABIs were noncompressible bilaterally in this clinic 01/28/17; currently the patient only has one small wound on the left lateral leg and I think this should be healed next week. We are going to try to get her bilateral extremitease stockings. She tells me that compression pumps heard her posterior rib arthritic knees so she is not been compliant with this 02/04/17; the patient's wounds are totally healed. She has chronic venous insufficiency and secondary lymphedema. She has new extremitease stockings. She also has compression pumps Readmission: 04/21/17 on evaluation today patient appears to be doing about the same as what she was  previous when we were evaluating her back in November 2018. At that point she had completely healed. With that being said she has now reopened and states that she wasn't aware that she was supposed to come back. 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 Subsequently she also states that although she has the compression that we got for her previously the extremitease stockings she nonetheless doesn't always wear these and being noncompliant often has things will reopen as far as ulcers on her lower extremities. No fevers, chills, nausea, or  vomiting noted at this time. Patient has no evidence of dementia. She is having some discomfort in regard to the ulcers at this point. 04/28/17 on evaluation today patient appears to be doing significantly better in regard to her wounds. With that being said she is still having significant discomfort and is not really keen on sharp debridement. She does not appear to have any evidence of infection which is good news I do think the Iodoflex is beneficial for her. Of note her ABI's were found in the system to be on the right 1.21 with [name] TBI of 0.76 and on the left 1.35 with [name] TBI of 0.75. In general her swelling appears to be significantly improved however. No fevers, chills, nausea, or vomiting noted at this time. Patient has been tolerating the wraps without complication. She has no evidence of dementia. 05/05/17-she is here in follow-up evaluation for bilateral lower extremity venous and lymphedema ulcers. She states she has not been using her lymphedema pumps secondary to pain at her knees. After discussion it was noted that she uses knee-high lymphedema pumps but does have [name] pair of thigh-high lymphedema pumps. She was strongly advised to use the thigh-high lymphedema pumps and if necessary reduce the setting for improved tolerance. She reports no change in odor and/or drainage. She continues to tolerate sharp debridement poorly with significant pain, primarily to the right lower extremity. We will continue with current treatment plan, along with improved lymphedema pump use and follow-up next week 05/12/17 on evaluation today patient actually appears to be doing much better in regard to her bilateral lower extremity ulcers. Only the right altar actually requires debridement today. The other two cleaned off nicely in two of the three in fact on the left lower extremity or actually almost completely healed. She does have some weeping noted still the bilateral lower extremities left greater than right.  She still is not able to use her compression pumps even though I have mentioned this several times to her as did Leah last week. No fevers, chills, nausea, or vomiting noted at this time. Patient has been tolerating the dressing changes without complication. Patient has no evidence of dementia 05/19/17 on evaluation today patient appears to be doing very well regard to her bilateral lower extremity ulcers. In fact she seems to be improving and in fact healed in [name] couple of locations that were giving her trouble last week. Overall I'm extremely pleased with how things have progressed. She still has some discomfort especially in the right lateral lower extremity. Fortunately there does not appear to be any evidence of significant infection which is good news she does tell me that she has used her compression pumps several times although not routinely over the past week I did praise her for this. I do think it will be beneficial and that might even be what's helping  with her wounds as far as healing is concerned at least to [name] degree. No fevers, chills, nausea, or vomiting noted at this time. Patient has no dementia and no evidence of infection. 05/26/17 on evaluation today patient presents for evaluation concerning her bilateral lower extremity ulcer secondary to lymphedema. Fortunately she appears to be doing very well at this point in time. Specifically her right leg appears to be completely closed and healed. The left leg though there is still [name] small area of opening overall has healed. She does have [name] little bit of excoriation at the top or the wraps are because they slid down on her. Otherwise there's no evidence of infection and the other has improved dramatically now that she is not draining as significantly. Patient is having no significant pain she tells me she has been able to use her lymphedema pumps one time [name] day although that's not all she can tolerate. Even that is difficult for her. 06/02/17  on evaluation today patient's ulcers appear to be completely closed at this point. She does still note some odor but I believe this is due to the fact she has not been able to wash her legs as well currently. Fortunately she did bring her compression with her today. She does have the EXTREMIT-EASE Compression. With that being said she is somewhat nervous about going back to this as previously she broke out yet again once we discontinue wrapping her. READMISSION 11/23/17; patient is now 75 year old diabetic woman with severe chronic venous insufficiency with lymphedema. We care for her last in this clinic in February 2019 at which time she had chronic venous insufficiency with lymphedema bilateral lower extremity ulcers. We discharged her with bilateral wrap around/extremitease stockings and compression pumps. Is fairly clear she is not using the compression pumps stating it causes pain in her knees. In any case she was admitted to hospital from 8/3 through 8/6 with [name] several week history of recurrent bilateral ulcers. She was felt to have bilateral cellulitis treated with doxycycline IV. She is still on oral doxycycline and has another 2 days worth. At discharge her white count was 14.1 hemoglobin at 8.9. The patient is [name] diabetic however her last ABIs in 2018 showed [name] right ABI of 1.21, left of 1.35. TBI's were 0.76 on the right 0.75 on the left. Posterior tibial artery was monophasic on the right triphasic on the left. Dorsalis pedis triphasic on the right and triphasic on the left. She is not felt to have significant macrovascular disease 12/02/17;significant bilateral lower extremity wounds secondary to severe uncontrolled lymphedema. She did not get home health out to change the wraps. So she kept this on all week. She is telling me she using his her compression pumps once [name] week. In general all the wounds look better. We've been using silver alginate with secondary absorptive dressings 4 layer  compression 12/10/17; bilateral lower extremity wounds secondary to uncontrolled lymphedema. She tells me she is using her compression pumps on most days. We've been keeping wraps on all week. Using silver alginate. She has wounds on the right lateral calf left medial and left lateral calf. All of this is somewhat better 12/16/17; the patient has 3 small open areas. One on the right lateral calf, one on the left medial calf and the small area on the left lateral calf. She has lymphedema, chronic venous insufficiency with hemosiderin deposition. Very fibrotic distal lower extremity scan with suggestion of an inverted bottle sign appearance to her legs. She has  compression pumps but she does not use them has not used them in the last week. Currently we have her in 4 layer compression, silver alginate to the wounds and this is being changed once by home health. She is noncompliant with the compression pumps because she says it hurts her knees. She also has compression stockings ando Juxta light stockings at home 12/24/17; the patient's right lateral calf is healed as well as the left medial. There is no open wound on the right leg. She still has 2 areas on the left lateral calf. We transitioned the right leg into an extremitease stocking which she brought in from home. 12/31/17; the patient has [name] superficial wound on the left lateral lower calf. We've been wrapping this leg. She arrives today with the right leg swollen but without [name] wound. She cannot get her extremities stocking on the right leg. There is [name] lot more edema here. She is not using her compression pumps 01/10/2018; patient has [name] same superficial wound on the left lateral calf. Her right leg has less peripheral edema. She tells me she also started herself on some Lasix that had been previously prescribed for her at some point but she had never taken it. She is also concerned about whether her compression pumps are leaking. She has extremitease  stockings however the edema gets bad enough she can get these on. Currently she has no open wound on the right leg and is mention the edema is actually better than when I saw this 10 days ago 01/17/2018; patient has [name] same superficial area on the posterior left calf and [name] weeping area in the mid part of the right lateral calf. She still does not have anyone from [facility] coming to see her compression pumps which she describes as being suspicious for [name] leakage. I am not really sure how frequently she is using this in any case. She has massive bilateral calf lymphedema. I think there is some spreading lymphedema into her posterior thighs. 01/24/2018; the patient's edema in her legs is actually some better. She still has [name] smaller area on the left posterior calf although this is better. The weeping area on the right lateral calf is also better. As it turned out she did not get her pumps from [facility] but [facility] and they are going to try to go out and see these this week which is going to help. I explained to her that she will use the compression pumps once [name] day while we are wrapping her legs but when she transitions to stockings that may be she will require pumping twice [name] day. Her compliance in this area has not been high and I wonder about her ability to do this herself. 02/01/2018; the area on the posterior left leg is healed. She still has the original wound on the right lateral calf which is epithelialized. The however just above this there is still weeping lymphedema fluid. She has [name] new area on the upper right calf which is either wrap injury or is she points out where she has been scratching under the wraps. We still not have managed to get [name] hold of [name] representative of [facility] to see if the pump is operational. She has extremitease stockings at home however I am not of the opinion that this will maintain her skin integrity without the compression  pumps. 02/08/2018; posterior left leg remains healed although she has [name] new small open area on the left lateral calf. Right  lateral calf has two quarter sized open areas and close juxtaposition. There is less weeping edema fluid. She apparently managed to contact [facility]. They are sending her [name] part. But she is still not received it 02/15/2018; the patient has no open wound on either leg. Some scale present on the right upper lateral calf just below the fibular head. Edema control is excellent. She did not bring her stockings or her juxta lite wrap around. She has the part for her compression pump but she has not use the compression pumps yet READMISSION 01/10/2019 Patient is now [name] 75 year old woman that we have had in the clinic multiple times in the past. She has chronic lower extremity lymphedema, recurrent wounds on her bilateral lower extremities. She is also [name] type II diabetic reasonably well controlled with [name] recent hemoglobin A1c while she was in the hospital of 5.9. She has wounds on her bilateral lower extremities mostly on the left lateral and right lateral. These were noted also during her recent hospitalization. The patient states they have been there for [name] while. She has not been able to wear her stockings he has trouble getting them on as she lives alone. She also has external compression pumps but uses them sparingly because it causes pain in her knees which hurt because of osteoarthritis. The patient was on doxycycline before she went into the hospital apparently for fear of infection in her legs and is on Keflex coming out because of [name] UTI The patient was recently in the hospital from 01/06/2019 through 01/09/2019. She was admitted with nausea and vomiting and prerenal azotemia. This was corrected with IV fluid her Lasix has been put on hold. Hemoglobin A1c at 5.9 her metformin was discontinued. She was also felt to have [name] UTI she was prescribed Keflex at 503 times [name] day she  is picking that up today. The patient is not felt to have an arterial issue. ABIs in our clinic were 1.24 on the right and 1.06 on the left. She had formal studies in 2018 at which time her ABIs were 1.21 on the right TBI of 0.76 and on the left 1.35 and 0.75 respectively. Waveforms are on the right were monophasic and biphasic, triphasic and biphasic on the left. She is tolerated 4 layer compression in the past 10/13. Comes in today having not used her compression pumps /perhaps once in the last week. She has too much edema to consider healing these wounds. We have been using silver alginate 10/20; she tells me he had she used her compression pumps once [name] day as we asked. Clearly she has edema out of her legs although most of it seems to be settling around her knees which is what she complains about. We have been using silver alginate to the extensive de-epithelialized area on the left lateral calf and [name] much smaller area on the right lateral extending linearly into the right posterior 10/27; she is using her compression pumps daily. Her edema control is better. We have been using silver alginate to the extensive wound areas on her bilateral lower legs. This includes left anterior left lateral and left posterior. Right anterior right lateral and right posterior. The wounds are not all in the same condition. She has severe bilateral skin damage with the epithelialization, flaking dried epithelium 11/3; she is using her compression pumps 1 times daily. Pretty obvious that her wraps fell down especially on the left leg to mid calf. I think things are improving on the right lateral  calf, I do not see anything open posteriorly. The major areas on the left lateral where she has 3 or 4 deeper wounds in the midst of an entire de-epithelialized area. There is erythema here but no tenderness I think this represents venous stasis rather than cellulitis. I debrided the areas on the left lateral calf last week  but once again they have tightly adherent debris over the top of them which is disappointing 11/10; the patient's major areas on the left lateral calf to 3 wounds with some depth surrounded by [name] large area of de-epithelialized tissue. There is no infection. We have been using silver alginate under 4-layer compression. On the right lateral she has [name] more contained wound area that is superficial. Been using silver alginate under 4-layer compression 11/17; the patient's major wound is on the left lateral calf. We have been using silver alginate under compression. There is no open wound per se on the right but it de-epithelialized area on the lateral calf that is weeping edema fluid. I do not see much change here today. 12/1; patient arrives in clinic with her wounds looking better. Her edema control is better. The lymphedema specialist working on the left leg we have been working on the right. We have been using 4-layer compression. The patient is using [name] consider external compression pumps and Hydrofera Blue to the open areas. She is being measured for an abdominal compression pump and apparently that is going to go through her insurance 12/15; continued improvement. 2 small areas on the left and slightly larger 2 small areas on the right. 12/22; patient still has open areas on the right lateral calf which are superficial and the new one on the right anterior calf. She did not use her compression pumps this week. Her left leg is wrapped by our lymphedema specialist. There are no open wounds here 12/29; she still has [name] superficial area on the right lateral calf and area on the right medial calf. Right anterior wound area appears to have closed. She has much better edema control today than last week 04/11/2019; the patient is here with severe bilateral lymphedema, chronic venous insufficiency with marked skin changes in her lower extremities. We are wrapping her right leg and her lymphedema specialist is  putting lymphedema wraps on the left. She states she is using her compression pumps although in the past her compliance with uses been limited because of osteoarthritic pain in her knees. She only has one remaining area on the right leg however she has wrap injuries on the left at the upper level what where I think her wraps were cause. the left lateral area left posterior. These would be new this week 1/12; generally better. The wrap injuries in the upper calf on the left from last week are down to [name] single wound. She has 2 wounds anteriorly that are still weeping fluid although these appear to have been epithelialized surface for the most part. There is no open wounds on the right leg we are wrapping her right leg and 4-layer compression and we are going [name] lymphedema wrap on the left leg. The the approach is [name] Farrow 2000 wrap on the left leg and then subsequently the right leg. She claims to be compliant with her compression pumps 1/19; the patient has 1 tiny open area on the right medial calf. The rest of this is not open. She however has [name] large erythematous denuded area on the left anterior tibia extending laterally. There is no evidence  of infection here. She has her new current waist high compression pumps 1/26; the patient has no open wounds on the right. On the left anteriorly much better than last week still [name] smattering of small weeping open areas. There is no evidence of infection 2/2; still no open wounds on the right leg. He still does not have [name] external compression garment. She arrives in clinic today with an open area on the plantar aspect of the left second toe at the base of the PIP. This apparently was painful last week so it was not wrapped by our lymphedema specialist. There was no open wound at the time. The patient denies traumatizing the toe however today she arrives with [name] small but deep wound in this area the dorsal surface of the toe is very tender. 2/11; she arrives in  clinic today with everything epithelialized. She was that way last week on the right but we have still been wrapping. She has been using lymphedema wraps on the left. On careful inspection of the left leg she still has areas that look vulnerable but no clearly open wound there may be some weeping through cracks in the skin. She is using above the waist external compression pumps and she has [name] separate external compression garment for her thighs. The plan is to discharge her for follow-up in lymphedema clinic with Juliann Pulse our lymphedema specialist. In the past healing this woman has resulted in relatively quick breakdown however she generally seems more motivated example willing to use her external compression pumps etc. READMISSION 02/13/2020. Mrs. Valdivia is [name] now 75 year old woman who has bilateral lower extremity lymphedema and [name] history of difficult to treat bilateral lower extremity wounds. When she was here last time we had her seen by [name] lymphedema specialist. She had lower extremity compression stockings as well as compression garments for her thighs. She has compression pumps as well. Unfortunately she has not been doing well. She describes unrelenting nausea with vomiting anytime she tries to eat or drink or take her medications. She was seen in urgent care on 10/26. Notably they found her to be somewhat dehydrated. Gave her [name] liter of fluid. Her creatinine was up to 2.42. The rest of her lab work including liver function tests, white count, hemoglobin were all normal. She did have [name] urine for culture that showed Klebsiella she was prescribed ciprofloxacin but I seriously doubt that she is able to keep this down by her description. She has not taken any of her meds in over [name] month. She states she feels too weak to take care of her lower extremities with compression garments and her external compression pumps she has not been using any of these. As [name] result she has 2 large areas on the left lateral  calf one anteriorly [name] cluster of 3 on the right lateral and [name] small area on her right ankle. All of these are superficial. She has poorly controlled lymphedema especially on the anterior tibial areas however by my my anxiety she actually looks like she has less swelling than what I remember. Past medical history includes type 2 diabetes with [name] recent hemoglobin A1c of 6.8 chronic venous insufficiency, lymphedema which she was managing with above the waist compression and compression pumps, diastolic congestive heart failure. Nausea and vomiting as described above. We did not repeat her arterial studies today however these were quite normal when they were last done in 2018 with an ABI of 1.21 on the right and 1.35 on the left.  TBI's were within normal limits at 0.76 and 0.75 she had triphasic waveforms on the left monophasic waveforms at the posterior tibial but triphasic waveforms at the dorsalis pedis. She has previously tolerated 4 layer compression 11/18; the patient has [name] cluster of wounds on the right lateral lower calf and areas on the left lateral calf. We have been using silver alginate under compression she seems to be tolerating this well. Her edema is improved. HOWEVER she arrives in clinic today still with [name] description of bilious vomiting that is making it impossible for her to eat and drink. Her vitals are stable however she looks more gaunt. I have tried to talk her into going to the emergency room to be checked out however she will not hear anything of it 11/29; the patient's right leg is closed. On the left leg she has 2 small superficial open areas anteriorly and one just to the lateral part of the tibia. These are just about closed. She did go to the ER with regards to her persistent nausea and vomiting. Apparently they did not find anything and did [name] CT scan of her abdomen although I have not yet had [name] chance to review these records Readmission: 10/16/2020 upon evaluation today  patient presents for reevaluation here in the clinic due to issues that she is can have one of the bilateral lower extremities. More importantly she has an issue which looks to be more of [name] friction injury to be honest in the posterior upper thigh on the left. Again this appears to potentially be where [name] brief could have been pulled [name] little bit causing [name] friction injury. Again it does not look like [name] pressure injury at all very superficial not really draining much at all. Fortunately there does not appear to be any signs of active infection at this time which is great news. No fevers, chills, nausea, vomiting, or diarrhea. Patient does have [name] history of bilateral lower extremity lymphedema, hypertension, congestive heart failure, and long-term use of anticoagulant therapy. She is currently on Eliquis. Unfortunately since we last seen her she apparently ended up apparently fallen in her home and she was subsequently found on the ground by her daughter admitted to the hospital for altered mental status and weakness she was found to have [name] UTI and Warnicke's encephalopathy. Upon discharge mental status had improved she had fevers which were thought to be potentially due to pseudogout flare. She did improve with steroids. She also had an EGD which showed reflux esophagitis and multiple large and small nonbleeding duodenal ulcers. She was discharged on 04/08/2020 to [name] SNF for rehab she has been there since. It was her daughter who actually found her as she was not able to get in touch with her mom. For that reason she called the police for [name] safety check and found her lying on the floor covered with stool and bugs crawling all over her. She was seen by her PCP for intractable nausea and vomiting 4 to 6 weeks prior. She was found to have profound weakness which honestly has not really improved much at this point. Her CT scan was negative. 8/31; the patient returns to clinic now at [name] local nursing home I  believe on [place]. It sounds as though she is been discharged for rehab and is now [name] chronic care patient in the facility. 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 She is cognitively intact. She comes in the clinic  with the wound on her left medial upper thigh. She has kerlix Coban wraps on her bilateral lower extremities and [name] vague history of wound on her buttock. Supposed to be using Xeroform according to [facility] last notes from July although she came in with nothing on the thigh wound today 9/28; patient comes in today with the area on her left medial upper thigh posteriorly actually quite [name] bit worse than last time. When she was here last month she had [name] single open area she comes in today with multiple open areas and denuded skin some of the areas look like reniform purpura however on closer inspection these look like blisters. There was denuded skin hanging off these areas. I think they are using silver alginate not sure what they are putting on top of this. The patient tells me that she is not much up in the wheelchair she sits on the side of her bed for long periods as [name] result. [name] lot of this is because of lower extremity edema 10/26; 1 month follow-up. She is still [name] long-term resident at the nursing facility on [place]. All of the areas on her posterior left lower leg are healed. This was [name] particularly nasty set of blisters which turns out to be probable friction blisters from long-term sitting however when I first saw these I thought they might have [name] more ominous pattern to them. Fortunately this did not transpire. The patient has [name] history of bilateral chronic chronic venous insufficiency and lymphedema the facility is applying daily kerlix Coban which should be fine and maintaining edema control and skin integrity. Electronic Signature(s) Signed: 01/29/2021 4:58:10 PM By: Linton Ham MD Entered By: Linton Ham on 01/29/2021 10:23:55 -------------------------------------------------------------------------------- Physical Exam Details Patient Name: Date of Service: [name] [name], [name] [name] 01/29/2021 9:30 [name] M Medical Record Number: [id number] Patient Account Number: [id number] Date of Birth/Sex: Treating RN: [birth year] (75 y.o. [name] Primary Care Provider: [name] Other Clinician: Referring Provider: Treating Provider/Extender: [name] in Treatment: 15 Constitutional Patient is hypertensive.. Pulse regular and within target range for patient.Marland Kitchen Respirations regular, non-labored and within target range.. Temperature is normal and within the target range for the patient.Marland Kitchen Appears in no distress. Notes Wound exam; right posterior medial thigh everything is healed here. Some scar tissue but nothing looks threatened. There is no additional blisters. In her lower extremities she has bilateral chronic venous insufficiency with lymphedema. The the edema control here is seems adequate Electronic Signature(s) Signed: 01/29/2021 4:58:10 PM By: Linton Ham MD Entered By: Linton Ham on 01/29/2021 10:28:17 -------------------------------------------------------------------------------- Physician Orders Details Patient Name: Date of Service: Lightcap, [name] [name] 01/29/2021 9:30 [name] M Medical Record Number: [id number] Patient Account Number: [id number] Date of Birth/Sex: Treating RN: 11-30-1945 (75 y.o. [name] Primary Care Provider: [name] Other Clinician: Referring Provider: Treating Provider/Extender: [name] in Treatment: 15 Verbal / Phone Orders: No Diagnosis Coding Discharge From [facility] Services Discharge from [facility] Edema Control - Lymphedema / SCD / Other Bilateral Lower Extremities Elevate legs to the level of the heart or above for 30 minutes daily and/or when  sitting, [name] frequency of: - Throughout the day Moisturize legs daily. Compression stocking or Garment 30-40 mm/Hg pressure to: - Okay to wear compression stockings on both legs. Legs wrapped with Kerlix/Coban today. Electronic Signature(s) Signed: 01/29/2021 4:58:10 PM By: Linton Ham MD Signed: 01/30/2021 5:45:17 PM By: Levan Hurst RN, BSN Entered By: Levan Hurst on  01/29/2021 10:19:45 -------------------------------------------------------------------------------- Problem List Details Patient Name: Date of Service: [name] [name], [name] [name] 01/29/2021 9:30 [name] M Medical Record Number: [id number] Patient Account Number: [id number] Date of Birth/Sex: Treating RN: Jun 23, 1945 (75 y.o. [name] Primary Care Provider: [name] Other Clinician: Referring Provider: Treating Provider/Extender: [name] in Treatment: 15 Active Problems ICD-10 Encounter Code Description Active Date MDM Diagnosis I89.0 Lymphedema, not elsewhere classified 10/16/2020 No Yes M79.89 Other specified soft tissue disorders 10/16/2020 No Yes L97.122 Non-pressure chronic ulcer of left thigh with fat layer exposed 10/16/2020 No Yes I10 Essential (primary) hypertension 10/16/2020 No Yes I50.42 Chronic combined systolic (congestive) and diastolic (congestive) heart failure 10/16/2020 No Yes Z79.01 Long term (current) use of anticoagulants 10/16/2020 No Yes Inactive Problems Resolved Problems Electronic Signature(s) Signed: 01/29/2021 4:58:10 PM By: Linton Ham MD Entered By: Linton Ham on 01/29/2021 10:22:24 -------------------------------------------------------------------------------- Progress Note Details Patient Name: Date of Service: [name] [name], [name] [name] 01/29/2021 9:30 [name] M Medical Record Number: [id number] Patient Account Number: [id number] Date of Birth/Sex: Treating RN: February 11, 1946 (75 y.o. [name] Primary Care Provider: [name]  Other Clinician: Referring Provider: Treating Provider/Extender: [name] in Treatment: 15 Subjective History of Present Illness (HPI) 04/09/15; the patient is here for review of wounds on her bilateral lower extremities for the last month. The patient has [name] history of lymphedema and bilateral chronic venous insufficiency with inflammation she was here for review of wounds on her bilateral legs in 2014 she was discharged home with external compression pumps which she is currently not using. Over the last month she developed wounds on her bilateral lower legs with drainage and pain and she is here for our review of this. 04/16/15; the patient's edema is under much better control. She has 3 areas one on the right anterior medial leg one on the left posterior leg and [name] small open area with purulent drainage on the left anterior leg. I have cultured the area on the left anterior leg 04/23/15; continued improvement in the patient's edema. All her wounds of closed I see no open areas. Culture from the left anterior leg last time was negative. READMISSION 02/07/16; this patient is [name] woman that I saw her earlier in 2017. I believe we discharged her very quickly with bilateral wounds in her legs secondary to chronic venous hypertension with inflammation and secondary lymphedema. She had juxtalite stockings. She has external compression pumps at home from [name] stay in our clinic in 2014. She tells me that she's had wounds on her bilateral lower legs since March or April of this year. 3 wounds on the left 2 on the right. She is [name] type II diabetic and has [name] history of noncompressible vessels bilaterally although she is tolerated 4-layer compression. The last note from her primary physician Dr. [name] was in May. At that point Dr. [name] had referred the patient here for lymphedema management. The patient stated she made [name] phone call to year but was not able to arrange  an appointment. It is not clear that she is actually using anything on the wounds currently except some gauze that was saturated with drainage per our intake nurse. She is certainly not using her juxtalite stockings and by the patient's own admission she is not using her compression pumps because they are "irritating". I don't see [name] recent hemoglobin A1c. The patient states she has not been systemically unwell but the legs are painful 02/14/16; patient tolerated her Profore wraps reasonably well.  She is using the compression pumps 1 time per day. Measurements of her legs are better in terms of the amount of lymphedema 02/21/16; patient tolerated her Profore wraps it. She says she is using her compression pumps one or 2 times [name] day. Measurements of her leg circumference are improved and most of her wound dimensions are better, unfortunately the home health that we referred her to did not except her insurance but works successful in getting her accompanied that would except her insurance however she apparently has [name] $25 co-pay which she cannot afford as she is on [name] fixed income 03/05/16; patient states she did not back using the pumps. She is healed that 2 wounds on the left leg. She still has areas on the right lateral lower leg, right medial lower leg and left lateral lower leg. All of these look some better and have reduced in area 03/13/16; still 3 wounds 1 on the left lateral leg, one on the left medial/posterior leg which is really the most extensive and [name] small area on the right lateral leg. All of this in the setting of severe chronic lymphedema and chronic venous inflammation. Damage to the skin with cobblestone thickened skin. 03/20/16; still 2 areas. The area on the left lateral leg appears to of closed over. The right medial leg is still most extensive wound although it appears to be closing over right lateral only [name] small open area remains. Using silver alginate Profore wraps 03/27/16; the  area on the left leg remains closed. The edema control is better on the left than the right. The right medial leg is still weeping but everything appears to be epithelialized. The lateral aspect of the right leg appears closed. She tells me she has old juxtalite stockings. She has been compliant with her compression pumps. 04/03/16; the area on the left leg remains closed. She has ordered new juxtalite stockings. The right posterior medial wound is fully epithelialized. However she has an irritated area over the right Achilles that she states is been itching all week under the wraps.. She has been compliant with her compression pumps 04/10/16; the area on the left leg remains closed. The right posterior medial wound is also closed today. The irritated area over the right Achilles which was probably wrap injury is also fully epithelialized and closed. The patient has not received her new juxtalite stockings, she arrives in clinic today with an old juxtalite stocking in place. Sheis using her compression pumps secondary to her severe stage III lymphedema READMISSION 01/21/17; this is [name] patient we know from several previous stays in this clinic. She has lymphedema with chronic venous insufficiency and inflammation. The last time she was here we discharged her with bilateral juxta lites which she is compliant with. She also has external compression pumps at home from stays in our clinic in 2014 although she tells me that she is not very compliant with these as when she uses them [name] causes pain in her bilateral knees the patient states that her wounds on her left greater than right leg including 2 large areas on the left anterior and left lateral leg and an area on the right leg open in late June or July since then she is been followed by [facility] and she's had bilateral Unna boot supplied every week for the last month.. I think the wounds have actually improved although they are not specifically dressed.  She states she had an ultrasound to rule out DVT bilaterally that was negative.  She does not have arterial studies although she is [name] diabetic. She states she has [name] lot of pain in her legs she states she does not use her external compression pumps because it causes pain in her knees. As usual her ABIs were noncompressible bilaterally in this clinic 01/28/17; currently the patient only has one small wound on the left lateral leg and I think this should be healed next week. We are going to try to get her bilateral extremitease stockings. She tells me that compression pumps heard her posterior rib arthritic knees so she is not been compliant with this 02/04/17; the patient's wounds are totally healed. She has chronic venous insufficiency and secondary lymphedema. She has new extremitease stockings. She also has compression pumps Readmission: 04/21/17 on evaluation today patient appears to be doing about the same as what she was previous when we were evaluating her back in November 2018. At that point she had completely healed. With that being said she has now reopened and states that she wasn't aware that she was supposed to come back. 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 Subsequently she also states that although she has the compression that we got for her previously the extremitease stockings she nonetheless doesn't always wear these and being noncompliant often has things will reopen as far as ulcers on her lower extremities. No fevers, chills, nausea, or vomiting noted at this time. Patient has no evidence of dementia. She is having some discomfort in regard to the ulcers at this point. 04/28/17 on evaluation today patient appears to be doing significantly better in regard to her wounds. With that being said she is still having significant discomfort and is not really keen on sharp  debridement. She does not appear to have any evidence of infection which is good news I do think the Iodoflex is beneficial for her. Of note her ABI's were found in the system to be on the right 1.21 with [name] TBI of 0.76 and on the left 1.35 with [name] TBI of 0.75. In general her swelling appears to be significantly improved however. No fevers, chills, nausea, or vomiting noted at this time. Patient has been tolerating the wraps without complication. She has no evidence of dementia. 05/05/17-she is here in follow-up evaluation for bilateral lower extremity venous and lymphedema ulcers. She states she has not been using her lymphedema pumps secondary to pain at her knees. After discussion it was noted that she uses knee-high lymphedema pumps but does have [name] pair of thigh-high lymphedema pumps. She was strongly advised to use the thigh-high lymphedema pumps and if necessary reduce the setting for improved tolerance. She reports no change in odor and/or drainage. She continues to tolerate sharp debridement poorly with significant pain, primarily to the right lower extremity. We will continue with current treatment plan, along with improved lymphedema pump use and follow-up next week 05/12/17 on evaluation today patient actually appears to be doing much better in regard to her bilateral lower extremity ulcers. Only the right altar actually requires debridement today. The other two cleaned off nicely in two of the three in fact on the left lower extremity or actually almost completely healed. She does have some weeping noted still the bilateral lower extremities left greater than right. She still is not able to use her compression pumps even though I have mentioned this several times to her as did Denny Peon last  week. No fevers, chills, nausea, or vomiting noted at this time. Patient has been tolerating the dressing changes without complication. Patient has no evidence of dementia 05/19/17 on evaluation today patient  appears to be doing very well regard to her bilateral lower extremity ulcers. In fact she seems to be improving and in fact healed in [name] couple of locations that were giving her trouble last week. Overall I'm extremely pleased with how things have progressed. She still has some discomfort especially in the right lateral lower extremity. Fortunately there does not appear to be any evidence of significant infection which is good news she does tell me that she has used her compression pumps several times although not routinely over the past week I did praise her for this. I do think it will be beneficial and that might even be what's helping with her wounds as far as healing is concerned at least to [name] degree. No fevers, chills, nausea, or vomiting noted at this time. Patient has no dementia and no evidence of infection. 05/26/17 on evaluation today patient presents for evaluation concerning her bilateral lower extremity ulcer secondary to lymphedema. Fortunately she appears to be doing very well at this point in time. Specifically her right leg appears to be completely closed and healed. The left leg though there is still [name] small area of opening overall has healed. She does have [name] little bit of excoriation at the top or the wraps are because they slid down on her. Otherwise there's no evidence of infection and the other has improved dramatically now that she is not draining as significantly. Patient is having no significant pain she tells me she has been able to use her lymphedema pumps one time [name] day although that's not all she can tolerate. Even that is difficult for her. 06/02/17 on evaluation today patient's ulcers appear to be completely closed at this point. She does still note some odor but I believe this is due to the fact she has not been able to wash her legs as well currently. Fortunately she did bring her compression with her today. She does have the EXTREMIT-EASE Compression. With that being said  she is somewhat nervous about going back to this as previously she broke out yet again once we discontinue wrapping her. READMISSION 11/23/17; patient is now 75 year old diabetic woman with severe chronic venous insufficiency with lymphedema. We care for her last in this clinic in February 2019 at which time she had chronic venous insufficiency with lymphedema bilateral lower extremity ulcers. We discharged her with bilateral wrap around/extremitease stockings and compression pumps. Is fairly clear she is not using the compression pumps stating it causes pain in her knees. In any case she was admitted to hospital from 8/3 through 8/6 with [name] several week history of recurrent bilateral ulcers. She was felt to have bilateral cellulitis treated with doxycycline IV. She is still on oral doxycycline and has another 2 days worth. At discharge her white count was 14.1 hemoglobin at 8.9. The patient is [name] diabetic however her last ABIs in 2018 showed [name] right ABI of 1.21, left of 1.35. TBI's were 0.76 on the right 0.75 on the left. Posterior tibial artery was monophasic on the right triphasic on the left. Dorsalis pedis triphasic on the right and triphasic on the left. She is not felt to have significant macrovascular disease 12/02/17;significant bilateral lower extremity wounds secondary to severe uncontrolled lymphedema. She did not get home health out to change the wraps. So she kept  this on all week. She is telling me she using his her compression pumps once [name] week. In general all the wounds look better. We've been using silver alginate with secondary absorptive dressings 4 layer compression 12/10/17; bilateral lower extremity wounds secondary to uncontrolled lymphedema. She tells me she is using her compression pumps on most days. We've been keeping wraps on all week. Using silver alginate. She has wounds on the right lateral calf left medial and left lateral calf. All of this is somewhat better 12/16/17; the  patient has 3 small open areas. One on the right lateral calf, one on the left medial calf and the small area on the left lateral calf. She has lymphedema, chronic venous insufficiency with hemosiderin deposition. Very fibrotic distal lower extremity scan with suggestion of an inverted bottle sign appearance to her legs. She has compression pumps but she does not use them has not used them in the last week. Currently we have her in 4 layer compression, silver alginate to the wounds and this is being changed once by home health. She is noncompliant with the compression pumps because she says it hurts her knees. She also has compression stockings ando Juxta light stockings at home 12/24/17; the patient's right lateral calf is healed as well as the left medial. There is no open wound on the right leg. She still has 2 areas on the left lateral calf. We transitioned the right leg into an extremitease stocking which she brought in from home. 12/31/17; the patient has [name] superficial wound on the left lateral lower calf. We've been wrapping this leg. She arrives today with the right leg swollen but without [name] wound. She cannot get her extremities stocking on the right leg. There is [name] lot more edema here. She is not using her compression pumps 01/10/2018; patient has [name] same superficial wound on the left lateral calf. Her right leg has less peripheral edema. She tells me she also started herself on some Lasix that had been previously prescribed for her at some point but she had never taken it. She is also concerned about whether her compression pumps are leaking. She has extremitease stockings however the edema gets bad enough she can get these on. Currently she has no open wound on the right leg and is mention the edema is actually better than when I saw this 10 days ago 01/17/2018; patient has [name] same superficial area on the posterior left calf and [name] weeping area in the mid part of the right lateral calf. She still  does not have anyone from [facility] coming to see her compression pumps which she describes as being suspicious for [name] leakage. I am not really sure how frequently she is using this in any case. She has massive bilateral calf lymphedema. I think there is some spreading lymphedema into her posterior thighs. 01/24/2018; the patient's edema in her legs is actually some better. She still has [name] smaller area on the left posterior calf although this is better. The weeping area on the right lateral calf is also better. As it turned out she did not get her pumps from [facility] but [facility] and they are going to try to go out and see these this week which is going to help. I explained to her that she will use the compression pumps once [name] day while we are wrapping her legs but when she transitions to stockings that may be she will require pumping twice [name] day. Her compliance in this  area has not been high and I wonder about her ability to do this herself. 02/01/2018; the area on the posterior left leg is healed. She still has the original wound on the right lateral calf which is epithelialized. The however just above this there is still weeping lymphedema fluid. She has [name] new area on the upper right calf which is either wrap injury or is she points out where she has been scratching under the wraps. We still not have managed to get [name] hold of [name] representative of [facility] to see if the pump is operational. She has extremitease stockings at home however I am not of the opinion that this will maintain her skin integrity without the compression pumps. 02/08/2018; posterior left leg remains healed although she has [name] new small open area on the left lateral calf. Right lateral calf has two quarter sized open areas and close juxtaposition. There is less weeping edema fluid. She apparently managed to contact [facility]. They are sending her [name] part. But she is still not received  it 02/15/2018; the patient has no open wound on either leg. Some scale present on the right upper lateral calf just below the fibular head. Edema control is excellent. She did not bring her stockings or her juxta lite wrap around. She has the part for her compression pump but she has not use the compression pumps yet READMISSION 01/10/2019 Patient is now [name] 75 year old woman that we have had in the clinic multiple times in the past. She has chronic lower extremity lymphedema, recurrent wounds on her bilateral lower extremities. She is also [name] type II diabetic reasonably well controlled with [name] recent hemoglobin A1c while she was in the hospital of 5.9. She has wounds on her bilateral lower extremities mostly on the left lateral and right lateral. These were noted also during her recent hospitalization. The patient states they have been there for [name] while. She has not been able to wear her stockings he has trouble getting them on as she lives alone. She also has external compression pumps but uses them sparingly because it causes pain in her knees which hurt because of osteoarthritis. The patient was on doxycycline before she went into the hospital apparently for fear of infection in her legs and is on Keflex coming out because of [name] UTI The patient was recently in the hospital from 01/06/2019 through 01/09/2019. She was admitted with nausea and vomiting and prerenal azotemia. This was corrected with IV fluid her Lasix has been put on hold. Hemoglobin A1c at 5.9 her metformin was discontinued. She was also felt to have [name] UTI she was prescribed Keflex at 503 times [name] day she is picking that up today. The patient is not felt to have an arterial issue. ABIs in our clinic were 1.24 on the right and 1.06 on the left. She had formal studies in 2018 at which time her ABIs were 1.21 on the right TBI of 0.76 and on the left 1.35 and 0.75 respectively. Waveforms are on the right were monophasic and biphasic, triphasic  and biphasic on the left. She is tolerated 4 layer compression in the past 10/13. Comes in today having not used her compression pumps /perhaps once in the last week. She has too much edema to consider healing these wounds. We have been using silver alginate 10/20; she tells me he had she used her compression pumps once [name] day as we asked. Clearly she has edema out of her legs although most of  it seems to be settling around her knees which is what she complains about. We have been using silver alginate to the extensive de-epithelialized area on the left lateral calf and [name] much smaller area on the right lateral extending linearly into the right posterior 10/27; she is using her compression pumps daily. Her edema control is better. We have been using silver alginate to the extensive wound areas on her bilateral lower legs. This includes left anterior left lateral and left posterior. Right anterior right lateral and right posterior. The wounds are not all in the same condition. She has severe bilateral skin damage with the epithelialization, flaking dried epithelium 11/3; she is using her compression pumps 1 times daily. Pretty obvious that her wraps fell down especially on the left leg to mid calf. I think things are improving on the right lateral calf, I do not see anything open posteriorly. The major areas on the left lateral where she has 3 or 4 deeper wounds in the midst of an entire de-epithelialized area. There is erythema here but no tenderness I think this represents venous stasis rather than cellulitis. I debrided the areas on the left lateral calf last week but once again they have tightly adherent debris over the top of them which is disappointing 11/10; the patient's major areas on the left lateral calf to 3 wounds with some depth surrounded by [name] large area of de-epithelialized tissue. There is no infection. We have been using silver alginate under 4-layer compression. On the right lateral  she has [name] more contained wound area that is superficial. Been using silver alginate under 4-layer compression 11/17; the patient's major wound is on the left lateral calf. We have been using silver alginate under compression. There is no open wound per se on the right but it de-epithelialized area on the lateral calf that is weeping edema fluid. I do not see much change here today. 12/1; patient arrives in clinic with her wounds looking better. Her edema control is better. The lymphedema specialist working on the left leg we have been working on the right. We have been using 4-layer compression. The patient is using [name] consider external compression pumps and Hydrofera Blue to the open areas. She is being measured for an abdominal compression pump and apparently that is going to go through her insurance 12/15; continued improvement. 2 small areas on the left and slightly larger 2 small areas on the right. 12/22; patient still has open areas on the right lateral calf which are superficial and the new one on the right anterior calf. She did not use her compression pumps this week. Her left leg is wrapped by our lymphedema specialist. There are no open wounds here 12/29; she still has [name] superficial area on the right lateral calf and area on the right medial calf. Right anterior wound area appears to have closed. She has much better edema control today than last week 04/11/2019; the patient is here with severe bilateral lymphedema, chronic venous insufficiency with marked skin changes in her lower extremities. We are wrapping her right leg and her lymphedema specialist is putting lymphedema wraps on the left. She states she is using her compression pumps although in the past her compliance with uses been limited because of osteoarthritic pain in her knees. She only has one remaining area on the right leg however she has wrap injuries on the left at the upper level what where I think her wraps were cause. the  left lateral area left posterior.  These would be new this week 1/12; generally better. The wrap injuries in the upper calf on the left from last week are down to [name] single wound. She has 2 wounds anteriorly that are still weeping fluid although these appear to have been epithelialized surface for the most part. There is no open wounds on the right leg we are wrapping her right leg and 4-layer compression and we are going [name] lymphedema wrap on the left leg. The the approach is [name] [place] 2000 wrap on the left leg and then subsequently the right leg. She claims to be compliant with her compression pumps 1/19; the patient has 1 tiny open area on the right medial calf. The rest of this is not open. She however has [name] large erythematous denuded area on the left anterior tibia extending laterally. There is no evidence of infection here. She has her new current waist high compression pumps 1/26; the patient has no open wounds on the right. On the left anteriorly much better than last week still [name] smattering of small weeping open areas. There is no evidence of infection 2/2; still no open wounds on the right leg. He still does not have [name] external compression garment. She arrives in clinic today with an open area on the plantar aspect of the left second toe at the base of the PIP. This apparently was painful last week so it was not wrapped by our lymphedema specialist. There was no open wound at the time. The patient denies traumatizing the toe however today she arrives with [name] small but deep wound in this area the dorsal surface of the toe is very tender. 2/11; she arrives in clinic today with everything epithelialized. She was that way last week on the right but we have still been wrapping. She has been using lymphedema wraps on the left. On careful inspection of the left leg she still has areas that look vulnerable but no clearly open wound there may be some weeping through cracks in the skin. She is using above  the waist external compression pumps and she has [name] separate external compression garment for her thighs. The plan is to discharge her for follow-up in lymphedema clinic with Juliann Pulse our lymphedema specialist. In the past healing this woman has resulted in relatively quick breakdown however she generally seems more motivated example willing to use her external compression pumps etc. READMISSION 02/13/2020. Mrs. Maes is [name] now 75 year old woman who has bilateral lower extremity lymphedema and [name] history of difficult to treat bilateral lower extremity wounds. When she was here last time we had her seen by [name] lymphedema specialist. She had lower extremity compression stockings as well as compression garments for her thighs. She has compression pumps as well. Unfortunately she has not been doing well. She describes unrelenting nausea with vomiting anytime she tries to eat or drink or take her medications. She was seen in urgent care on 10/26. Notably they found her to be somewhat dehydrated. Gave her [name] liter of fluid. Her creatinine was up to 2.42. The rest of her lab work including liver function tests, white count, hemoglobin were all normal. She did have [name] urine for culture that showed Klebsiella she was prescribed ciprofloxacin but I seriously doubt that she is able to keep this down by her description. She has not taken any of her meds in over [name] month. She states she feels too weak to take care of her lower extremities with compression garments and her external compression pumps she  has not been using any of these. As [name] result she has 2 large areas on the left lateral calf one anteriorly [name] cluster of 3 on the right lateral and [name] small area on her right ankle. All of these are superficial. She has poorly controlled lymphedema especially on the anterior tibial areas however by my my anxiety she actually looks like she has less swelling than what I remember. Past medical history includes type 2 diabetes with  [name] recent hemoglobin A1c of 6.8 chronic venous insufficiency, lymphedema which she was managing with above the waist compression and compression pumps, diastolic congestive heart failure. Nausea and vomiting as described above. We did not repeat her arterial studies today however these were quite normal when they were last done in 2018 with an ABI of 1.21 on the right and 1.35 on the left. TBI's were within normal limits at 0.76 and 0.75 she had triphasic waveforms on the left monophasic waveforms at the posterior tibial but triphasic waveforms at the dorsalis pedis. She has previously tolerated 4 layer compression 11/18; the patient has [name] cluster of wounds on the right lateral lower calf and areas on the left lateral calf. We have been using silver alginate under compression she seems to be tolerating this well. Her edema is improved. HOWEVER she arrives in clinic today still with [name] description of bilious vomiting that is making it impossible for her to eat and drink. Her vitals are stable however she looks more gaunt. I have tried to talk her into going to the emergency room to be checked out however she will not hear anything of it 11/29; the patient's right leg is closed. On the left leg she has 2 small superficial open areas anteriorly and one just to the lateral part of the tibia. These are just about closed. She did go to the ER with regards to her persistent nausea and vomiting. Apparently they did not find anything and did [name] CT scan of her abdomen although I have not yet had [name] chance to review these records Readmission: 10/16/2020 upon evaluation today patient presents for reevaluation here in the clinic due to issues that she is can have one of the bilateral lower extremities. More importantly she has an issue which looks to be more of [name] friction injury to be honest in the posterior upper thigh on the left. Again this appears to potentially be where [name] brief could have been pulled [name] little bit  causing [name] friction injury. Again it does not look like [name] pressure injury at all very superficial not really draining much at all. Fortunately there does not appear to be any signs of active infection at this time which is great news. No fevers, chills, nausea, vomiting, or diarrhea. Patient does have [name] history of bilateral lower extremity lymphedema, hypertension, congestive heart failure, and long-term use of anticoagulant therapy. She is currently on Eliquis. Unfortunately since we last seen her she apparently ended up apparently fallen in her home and she was subsequently found on the ground by her daughter admitted to the hospital for altered mental status and weakness she was found to have [name] UTI and Warnicke's encephalopathy. Upon discharge mental status had improved she had fevers which were thought to be potentially due to pseudogout flare. She did improve with steroids. She also had an EGD which showed reflux esophagitis and multiple large and small nonbleeding duodenal ulcers. She was discharged on 04/08/2020 to [name] SN[facility] for [facility]hab she has been there since. It was  her daughter who actually found her as she was not able to get in touch with her mom. For that reason she called the police for [name] safety check and found her lying on the floor covered with stool and bugs crawling all over her. She was seen by her PCP for intractable nausea and vomiting 4 to 6 weeks prior. She was found to have profound weakness which honestly has not really improved much at this point. Her CT scan was negative. 8/31; the patient returns to clinic now at [name] local nursing home I believe on [place]. It sounds as though she is been discharged for rehab and is now [name] chronic care patient in the facility. 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 She is cognitively intact. She comes in the clinic with the wound on her left medial upper thigh. She has kerlix Coban wraps on  her bilateral lower extremities and [name] vague history of wound on her buttock. Supposed to be using Xeroform according to [facility] last notes from July although she came in with nothing on the thigh wound today 9/28; patient comes in today with the area on her left medial upper thigh posteriorly actually quite [name] bit worse than last time. When she was here last month she had [name] single open area she comes in today with multiple open areas and denuded skin some of the areas look like reniform purpura however on closer inspection these look like blisters. There was denuded skin hanging off these areas. I think they are using silver alginate not sure what they are putting on top of this. The patient tells me that she is not much up in the wheelchair she sits on the side of her bed for long periods as [name] result. [name] lot of this is because of lower extremity edema 10/26; 1 month follow-up. She is still [name] long-term resident at the nursing facility on [address]. All of the areas on her posterior left lower leg are healed. This was [name] particularly nasty set of blisters which turns out to be probable friction blisters from long-term sitting however when I first saw these I thought they might have [name] more ominous pattern to them. Fortunately this did not transpire. The patient has [name] history of bilateral chronic chronic venous insufficiency and lymphedema the facility is applying daily kerlix Coban which should be fine and maintaining edema control and skin integrity. Objective Constitutional Patient is hypertensive.. Pulse regular and within target range for patient.Marland Kitchen Respirations regular, non-labored and within target range.. Temperature is normal and within the target range for the patient.Marland Kitchen Appears in no distress. Vitals Time Taken: 10:01 AM, Temperature: 98.7 F, Pulse: 106 bpm, Respiratory Rate: 18 breaths/min, Blood Pressure: 165/83 mmHg. General Notes: Wound exam; right posterior medial thigh  everything is healed here. Some scar tissue but nothing looks threatened. There is no additional blisters. In her lower extremities she has bilateral chronic venous insufficiency with lymphedema. The the edema control here is seems adequate Integumentary (Hair, Skin) Wound #47 status is Healed - Epithelialized. Original cause of wound was Gradually Appeared. The date acquired was: 10/16/2020. The wound has been in treatment 15 weeks. The wound is located on the Left,Posterior Upper Leg. The wound measures 0cm length x 0cm width x 0cm depth; 0cm^2 area and 0cm^3 volume. There is no tunneling or undermining noted. There is [name] none present amount of drainage noted. The wound margin is distinct with the outline  attached to the wound base. There is no granulation within the wound bed. There is no necrotic tissue within the wound bed. Assessment Active Problems ICD-10 Lymphedema, not elsewhere classified Other specified soft tissue disorders Non-pressure chronic ulcer of left thigh with fat layer exposed Essential (primary) hypertension Chronic combined systolic (congestive) and diastolic (congestive) heart failure Long term (current) use of anticoagulants Plan Discharge From [facility] Services: Discharge from [place] Edema Control - Lymphedema / SCD / Other: Elevate legs to the level of the heart or above for 30 minutes daily and/or when sitting, [name] frequency of: - Throughout the day Moisturize legs daily. Compression stocking or Garment 30-40 mm/Hg pressure to: - Okay to wear compression stockings on both legs. Legs wrapped with Kerlix/Coban today. 1. I think the patient looks quite good. Her blisters were undoubtedly due to friction injury and the position she was sitting 2. She also has bilateral chronic venous insufficiency and lymphedema. The facility is using kerlix Coban they could substitute 20/30 mm below-knee stockings 3. Otherwise the patient can be discharged Electronic  Signature(s) Signed: 01/29/2021 4:58:10 PM By: Linton Ham MD Entered By: Linton Ham on 01/29/2021 10:29:15 -------------------------------------------------------------------------------- SuperBill Details Patient Name: Date of Service: [name] [name], [name] [name] 01/29/2021 Medical Record Number: [id number] Patient Account Number: [id number] Date of Birth/Sex: Treating RN: 1946-03-16 (75 y.o. [name] Primary Care Provider: [name] Other Clinician: Referring Provider: Treating Provider/Extender: [name] in Treatment: 15 Diagnosis Coding ICD-10 Codes Code Description I89.0 Lymphedema, not elsewhere classified M79.89 Other specified soft tissue disorders L97.122 Non-pressure chronic ulcer of left thigh with fat layer exposed I10 Essential (primary) hypertension I50.42 Chronic combined systolic (congestive) and diastolic (congestive) heart failure Z79.01 Long term (current) use of anticoagulants Facility Procedures CPT4 Code: [id number] Description: 99213 - WOUND CARE VISIT-LEV 3 EST PT Modifier: Quantity: 1 Physician Procedures : CPT4 Code Description Modifier [id number] - WC PHYS LEVEL 2 - EST PT ICD-10 Diagnosis Description L97.122 Non-pressure chronic ulcer of left thigh with fat layer exposed I89.0 Lymphedema, not elsewhere classified Quantity: 1 Electronic Signature(s) Signed: 01/29/2021 4:58:10 PM By: Linton Ham MD Signed: 01/30/2021 5:45:17 PM By: Levan Hurst RN, BSN Entered By: Levan Hurst on 01/29/2021 11:51:59

## 2021-02-03 DIAGNOSIS — K068 Other specified disorders of gingiva and edentulous alveolar ridge: Secondary | ICD-10-CM | POA: Diagnosis not present

## 2021-02-03 DIAGNOSIS — N390 Urinary tract infection, site not specified: Secondary | ICD-10-CM | POA: Diagnosis not present

## 2021-02-03 DIAGNOSIS — J3489 Other specified disorders of nose and nasal sinuses: Secondary | ICD-10-CM | POA: Diagnosis not present

## 2021-02-05 DIAGNOSIS — B351 Tinea unguium: Secondary | ICD-10-CM | POA: Diagnosis not present

## 2021-02-05 DIAGNOSIS — L603 Nail dystrophy: Secondary | ICD-10-CM | POA: Diagnosis not present

## 2021-02-05 DIAGNOSIS — E1159 Type 2 diabetes mellitus with other circulatory complications: Secondary | ICD-10-CM | POA: Diagnosis not present

## 2021-02-06 DIAGNOSIS — N189 Chronic kidney disease, unspecified: Secondary | ICD-10-CM | POA: Diagnosis not present

## 2021-02-10 DIAGNOSIS — E870 Hyperosmolality and hypernatremia: Secondary | ICD-10-CM | POA: Diagnosis not present

## 2021-02-10 DIAGNOSIS — N1831 Chronic kidney disease, stage 3a: Secondary | ICD-10-CM | POA: Diagnosis not present

## 2021-02-10 DIAGNOSIS — G8929 Other chronic pain: Secondary | ICD-10-CM | POA: Diagnosis not present

## 2021-02-10 DIAGNOSIS — H547 Unspecified visual loss: Secondary | ICD-10-CM | POA: Diagnosis not present

## 2021-02-13 DIAGNOSIS — M7989 Other specified soft tissue disorders: Secondary | ICD-10-CM | POA: Diagnosis not present

## 2021-02-13 DIAGNOSIS — M79601 Pain in right arm: Secondary | ICD-10-CM | POA: Diagnosis not present

## 2021-02-13 DIAGNOSIS — I10 Essential (primary) hypertension: Secondary | ICD-10-CM | POA: Diagnosis not present

## 2021-02-13 DIAGNOSIS — R918 Other nonspecific abnormal finding of lung field: Secondary | ICD-10-CM | POA: Diagnosis not present

## 2021-02-13 DIAGNOSIS — R0989 Other specified symptoms and signs involving the circulatory and respiratory systems: Secondary | ICD-10-CM | POA: Diagnosis not present

## 2021-02-14 DIAGNOSIS — I10 Essential (primary) hypertension: Secondary | ICD-10-CM | POA: Diagnosis not present

## 2021-02-15 ENCOUNTER — Encounter (HOSPITAL_COMMUNITY): Payer: Self-pay

## 2021-02-15 ENCOUNTER — Emergency Department (HOSPITAL_COMMUNITY): Payer: PPO

## 2021-02-15 ENCOUNTER — Other Ambulatory Visit: Payer: Self-pay

## 2021-02-15 ENCOUNTER — Inpatient Hospital Stay (HOSPITAL_COMMUNITY): Payer: PPO

## 2021-02-15 ENCOUNTER — Inpatient Hospital Stay (HOSPITAL_COMMUNITY)
Admission: EM | Admit: 2021-02-15 | Discharge: 2021-02-20 | DRG: 871 | Disposition: A | Discharge status: Skilled Nursing Facility | Payer: PPO | Source: Skilled Nursing Facility | Attending: Internal Medicine | Admitting: Internal Medicine

## 2021-02-15 DIAGNOSIS — E876 Hypokalemia: Secondary | ICD-10-CM | POA: Diagnosis present

## 2021-02-15 DIAGNOSIS — Z2089 Contact with and (suspected) exposure to other communicable diseases: Secondary | ICD-10-CM | POA: Diagnosis not present

## 2021-02-15 DIAGNOSIS — R278 Other lack of coordination: Secondary | ICD-10-CM | POA: Diagnosis not present

## 2021-02-15 DIAGNOSIS — Z6834 Body mass index (BMI) 34.0-34.9, adult: Secondary | ICD-10-CM

## 2021-02-15 DIAGNOSIS — N189 Chronic kidney disease, unspecified: Secondary | ICD-10-CM | POA: Diagnosis not present

## 2021-02-15 DIAGNOSIS — K5900 Constipation, unspecified: Secondary | ICD-10-CM | POA: Diagnosis not present

## 2021-02-15 DIAGNOSIS — Z20822 Contact with and (suspected) exposure to covid-19: Secondary | ICD-10-CM | POA: Diagnosis present

## 2021-02-15 DIAGNOSIS — E119 Type 2 diabetes mellitus without complications: Secondary | ICD-10-CM | POA: Diagnosis not present

## 2021-02-15 DIAGNOSIS — D649 Anemia, unspecified: Secondary | ICD-10-CM | POA: Diagnosis not present

## 2021-02-15 DIAGNOSIS — R0902 Hypoxemia: Secondary | ICD-10-CM | POA: Diagnosis not present

## 2021-02-15 DIAGNOSIS — M2550 Pain in unspecified joint: Secondary | ICD-10-CM | POA: Diagnosis not present

## 2021-02-15 DIAGNOSIS — Z86718 Personal history of other venous thrombosis and embolism: Secondary | ICD-10-CM

## 2021-02-15 DIAGNOSIS — E43 Unspecified severe protein-calorie malnutrition: Secondary | ICD-10-CM | POA: Diagnosis present

## 2021-02-15 DIAGNOSIS — M1120 Other chondrocalcinosis, unspecified site: Secondary | ICD-10-CM | POA: Diagnosis not present

## 2021-02-15 DIAGNOSIS — E8809 Other disorders of plasma-protein metabolism, not elsewhere classified: Secondary | ICD-10-CM | POA: Diagnosis present

## 2021-02-15 DIAGNOSIS — E440 Moderate protein-calorie malnutrition: Secondary | ICD-10-CM | POA: Diagnosis not present

## 2021-02-15 DIAGNOSIS — D509 Iron deficiency anemia, unspecified: Secondary | ICD-10-CM | POA: Diagnosis not present

## 2021-02-15 DIAGNOSIS — K219 Gastro-esophageal reflux disease without esophagitis: Secondary | ICD-10-CM | POA: Diagnosis present

## 2021-02-15 DIAGNOSIS — Z8711 Personal history of peptic ulcer disease: Secondary | ICD-10-CM | POA: Diagnosis not present

## 2021-02-15 DIAGNOSIS — Z87891 Personal history of nicotine dependence: Secondary | ICD-10-CM | POA: Diagnosis not present

## 2021-02-15 DIAGNOSIS — M19041 Primary osteoarthritis, right hand: Secondary | ICD-10-CM | POA: Diagnosis present

## 2021-02-15 DIAGNOSIS — Z7901 Long term (current) use of anticoagulants: Secondary | ICD-10-CM

## 2021-02-15 DIAGNOSIS — M6281 Muscle weakness (generalized): Secondary | ICD-10-CM | POA: Diagnosis not present

## 2021-02-15 DIAGNOSIS — E669 Obesity, unspecified: Secondary | ICD-10-CM | POA: Diagnosis not present

## 2021-02-15 DIAGNOSIS — U071 COVID-19: Secondary | ICD-10-CM | POA: Diagnosis not present

## 2021-02-15 DIAGNOSIS — E512 Wernicke's encephalopathy: Secondary | ICD-10-CM | POA: Diagnosis present

## 2021-02-15 DIAGNOSIS — Z825 Family history of asthma and other chronic lower respiratory diseases: Secondary | ICD-10-CM

## 2021-02-15 DIAGNOSIS — Z833 Family history of diabetes mellitus: Secondary | ICD-10-CM

## 2021-02-15 DIAGNOSIS — M4802 Spinal stenosis, cervical region: Secondary | ICD-10-CM | POA: Diagnosis present

## 2021-02-15 DIAGNOSIS — I350 Nonrheumatic aortic (valve) stenosis: Secondary | ICD-10-CM | POA: Diagnosis not present

## 2021-02-15 DIAGNOSIS — B49 Unspecified mycosis: Secondary | ICD-10-CM | POA: Diagnosis present

## 2021-02-15 DIAGNOSIS — J9811 Atelectasis: Secondary | ICD-10-CM | POA: Diagnosis not present

## 2021-02-15 DIAGNOSIS — M19042 Primary osteoarthritis, left hand: Secondary | ICD-10-CM | POA: Diagnosis present

## 2021-02-15 DIAGNOSIS — R509 Fever, unspecified: Secondary | ICD-10-CM | POA: Diagnosis not present

## 2021-02-15 DIAGNOSIS — I82511 Chronic embolism and thrombosis of right femoral vein: Secondary | ICD-10-CM | POA: Diagnosis not present

## 2021-02-15 DIAGNOSIS — L97911 Non-pressure chronic ulcer of unspecified part of right lower leg limited to breakdown of skin: Secondary | ICD-10-CM | POA: Diagnosis not present

## 2021-02-15 DIAGNOSIS — I890 Lymphedema, not elsewhere classified: Secondary | ICD-10-CM | POA: Diagnosis present

## 2021-02-15 DIAGNOSIS — M6389 Disorders of muscle in diseases classified elsewhere, multiple sites: Secondary | ICD-10-CM | POA: Diagnosis not present

## 2021-02-15 DIAGNOSIS — R000 Tachycardia, unspecified: Secondary | ICD-10-CM | POA: Diagnosis not present

## 2021-02-15 DIAGNOSIS — Z79899 Other long term (current) drug therapy: Secondary | ICD-10-CM

## 2021-02-15 DIAGNOSIS — I10 Essential (primary) hypertension: Secondary | ICD-10-CM | POA: Diagnosis present

## 2021-02-15 DIAGNOSIS — M7989 Other specified soft tissue disorders: Secondary | ICD-10-CM | POA: Diagnosis present

## 2021-02-15 DIAGNOSIS — M11232 Other chondrocalcinosis, left wrist: Secondary | ICD-10-CM | POA: Diagnosis present

## 2021-02-15 DIAGNOSIS — I69321 Dysphasia following cerebral infarction: Secondary | ICD-10-CM | POA: Diagnosis not present

## 2021-02-15 DIAGNOSIS — Z885 Allergy status to narcotic agent status: Secondary | ICD-10-CM

## 2021-02-15 DIAGNOSIS — R110 Nausea: Secondary | ICD-10-CM | POA: Diagnosis present

## 2021-02-15 DIAGNOSIS — Z801 Family history of malignant neoplasm of trachea, bronchus and lung: Secondary | ICD-10-CM

## 2021-02-15 DIAGNOSIS — N390 Urinary tract infection, site not specified: Secondary | ICD-10-CM | POA: Diagnosis not present

## 2021-02-15 DIAGNOSIS — Z820 Family history of epilepsy and other diseases of the nervous system: Secondary | ICD-10-CM | POA: Diagnosis not present

## 2021-02-15 DIAGNOSIS — A419 Sepsis, unspecified organism: Principal | ICD-10-CM | POA: Diagnosis present

## 2021-02-15 DIAGNOSIS — N179 Acute kidney failure, unspecified: Secondary | ICD-10-CM | POA: Diagnosis not present

## 2021-02-15 DIAGNOSIS — R293 Abnormal posture: Secondary | ICD-10-CM | POA: Diagnosis not present

## 2021-02-15 DIAGNOSIS — R531 Weakness: Secondary | ICD-10-CM | POA: Diagnosis not present

## 2021-02-15 DIAGNOSIS — M11231 Other chondrocalcinosis, right wrist: Secondary | ICD-10-CM | POA: Diagnosis present

## 2021-02-15 DIAGNOSIS — Z880 Allergy status to penicillin: Secondary | ICD-10-CM

## 2021-02-15 DIAGNOSIS — G9341 Metabolic encephalopathy: Secondary | ICD-10-CM | POA: Diagnosis not present

## 2021-02-15 DIAGNOSIS — D75839 Thrombocytosis, unspecified: Secondary | ICD-10-CM | POA: Diagnosis present

## 2021-02-15 DIAGNOSIS — F32A Depression, unspecified: Secondary | ICD-10-CM | POA: Diagnosis not present

## 2021-02-15 DIAGNOSIS — R109 Unspecified abdominal pain: Secondary | ICD-10-CM | POA: Diagnosis not present

## 2021-02-15 DIAGNOSIS — R488 Other symbolic dysfunctions: Secondary | ICD-10-CM | POA: Diagnosis not present

## 2021-02-15 LAB — CBC WITH DIFFERENTIAL/PLATELET
Abs Immature Granulocytes: 0.07 10*3/uL (ref 0.00–0.07)
Basophils Absolute: 0.1 10*3/uL (ref 0.0–0.1)
Basophils Relative: 1 %
Eosinophils Absolute: 0 10*3/uL (ref 0.0–0.5)
Eosinophils Relative: 0 %
HCT: 28.8 % — ABNORMAL LOW (ref 36.0–46.0)
Hemoglobin: 9 g/dL — ABNORMAL LOW (ref 12.0–15.0)
Immature Granulocytes: 1 %
Lymphocytes Relative: 14 %
Lymphs Abs: 1.8 10*3/uL (ref 0.7–4.0)
MCH: 22.7 pg — ABNORMAL LOW (ref 26.0–34.0)
MCHC: 31.3 g/dL (ref 30.0–36.0)
MCV: 72.7 fL — ABNORMAL LOW (ref 80.0–100.0)
Monocytes Absolute: 1.4 10*3/uL — ABNORMAL HIGH (ref 0.1–1.0)
Monocytes Relative: 12 %
Neutro Abs: 8.9 10*3/uL — ABNORMAL HIGH (ref 1.7–7.7)
Neutrophils Relative %: 72 %
Platelets: 510 10*3/uL — ABNORMAL HIGH (ref 150–400)
RBC: 3.96 MIL/uL (ref 3.87–5.11)
RDW: 19.9 % — ABNORMAL HIGH (ref 11.5–15.5)
WBC: 12.3 10*3/uL — ABNORMAL HIGH (ref 4.0–10.5)
nRBC: 0 % (ref 0.0–0.2)

## 2021-02-15 LAB — COMPREHENSIVE METABOLIC PANEL
ALT: 21 U/L (ref 0–44)
AST: 30 U/L (ref 15–41)
Albumin: 2.4 g/dL — ABNORMAL LOW (ref 3.5–5.0)
Alkaline Phosphatase: 95 U/L (ref 38–126)
Anion gap: 10 (ref 5–15)
BUN: 13 mg/dL (ref 8–23)
CO2: 29 mmol/L (ref 22–32)
Calcium: 8.7 mg/dL — ABNORMAL LOW (ref 8.9–10.3)
Chloride: 101 mmol/L (ref 98–111)
Creatinine, Ser: 0.78 mg/dL (ref 0.44–1.00)
GFR, Estimated: >60 mL/min (ref >60)
Glucose, Bld: 139 mg/dL — ABNORMAL HIGH (ref 70–99)
Potassium: 2.3 mmol/L — CL (ref 3.5–5.1)
Sodium: 140 mmol/L (ref 135–145)
Total Bilirubin: 1 mg/dL (ref 0.3–1.2)
Total Protein: 6.9 g/dL (ref 6.5–8.1)

## 2021-02-15 LAB — C-REACTIVE PROTEIN: CRP: 29.7 mg/dL — ABNORMAL HIGH (ref <1.0)

## 2021-02-15 LAB — URINALYSIS, ROUTINE W REFLEX MICROSCOPIC
Glucose, UA: NEGATIVE mg/dL
Ketones, ur: 20 mg/dL — AB
Nitrite: NEGATIVE
Protein, ur: 100 mg/dL — AB
Specific Gravity, Urine: 1.033 — ABNORMAL HIGH (ref 1.005–1.030)
pH: 5 (ref 5.0–8.0)

## 2021-02-15 LAB — RENAL FUNCTION PANEL
Albumin: 2.3 g/dL — ABNORMAL LOW (ref 3.5–5.0)
Albumin: 2.5 g/dL — ABNORMAL LOW (ref 3.5–5.0)
Anion gap: 12 (ref 5–15)
Anion gap: 10 (ref 5–15)
BUN: 12 mg/dL (ref 8–23)
BUN: 14 mg/dL (ref 8–23)
CO2: 27 mmol/L (ref 22–32)
CO2: 26 mmol/L (ref 22–32)
Calcium: 8.8 mg/dL — ABNORMAL LOW (ref 8.9–10.3)
Calcium: 8.7 mg/dL — ABNORMAL LOW (ref 8.9–10.3)
Chloride: 100 mmol/L (ref 98–111)
Chloride: 103 mmol/L (ref 98–111)
Creatinine, Ser: 0.82 mg/dL (ref 0.44–1.00)
Creatinine, Ser: 0.68 mg/dL (ref 0.44–1.00)
GFR, Estimated: >60 mL/min (ref >60)
GFR, Estimated: >60 mL/min (ref >60)
Glucose, Bld: 123 mg/dL — ABNORMAL HIGH (ref 70–99)
Glucose, Bld: 149 mg/dL — ABNORMAL HIGH (ref 70–99)
Phosphorus: 3.1 mg/dL (ref 2.5–4.6)
Phosphorus: 2.7 mg/dL (ref 2.5–4.6)
Potassium: 2.5 mmol/L — CL (ref 3.5–5.1)
Potassium: 2.9 mmol/L — ABNORMAL LOW (ref 3.5–5.1)
Sodium: 139 mmol/L (ref 135–145)
Sodium: 139 mmol/L (ref 135–145)

## 2021-02-15 LAB — SEDIMENTATION RATE: Sed Rate: 95 mm/hr — ABNORMAL HIGH (ref 0–22)

## 2021-02-15 LAB — PROTIME-INR
INR: 1.5 — ABNORMAL HIGH (ref 0.8–1.2)
Prothrombin Time: 18.4 seconds — ABNORMAL HIGH (ref 11.4–15.2)

## 2021-02-15 LAB — MAGNESIUM: Magnesium: 1.9 mg/dL (ref 1.7–2.4)

## 2021-02-15 LAB — BRAIN NATRIURETIC PEPTIDE: B Natriuretic Peptide: 128.9 pg/mL — ABNORMAL HIGH (ref 0.0–100.0)

## 2021-02-15 LAB — HEMOGLOBIN A1C
Hgb A1c MFr Bld: 6 % — ABNORMAL HIGH (ref 4.8–5.6)
Mean Plasma Glucose: 125.5 mg/dL

## 2021-02-15 LAB — LACTIC ACID, PLASMA
Lactic Acid, Venous: 1.1 mmol/L (ref 0.5–1.9)
Lactic Acid, Venous: 1.1 mmol/L (ref 0.5–1.9)

## 2021-02-15 LAB — RESP PANEL BY RT-PCR (FLU A&B, COVID) ARPGX2
Influenza A by PCR: NEGATIVE
Influenza B by PCR: NEGATIVE
SARS Coronavirus 2 by RT PCR: NEGATIVE

## 2021-02-15 LAB — GLUCOSE, CAPILLARY
Glucose-Capillary: 111 mg/dL — ABNORMAL HIGH (ref 70–99)
Glucose-Capillary: 133 mg/dL — ABNORMAL HIGH (ref 70–99)
Glucose-Capillary: 116 mg/dL — ABNORMAL HIGH (ref 70–99)

## 2021-02-15 LAB — URIC ACID: Uric Acid, Serum: 5.7 mg/dL (ref 2.5–7.1)

## 2021-02-15 LAB — APTT: aPTT: 46 seconds — ABNORMAL HIGH (ref 24–36)

## 2021-02-15 IMAGING — DX DG CHEST 1V PORT
1 series · 1 of 1 positions shown · non-contrast
Comparison: [DATE]

CLINICAL DATA: Sepsis

EXAM:
PORTABLE CHEST 1 VIEW

[chest ap]
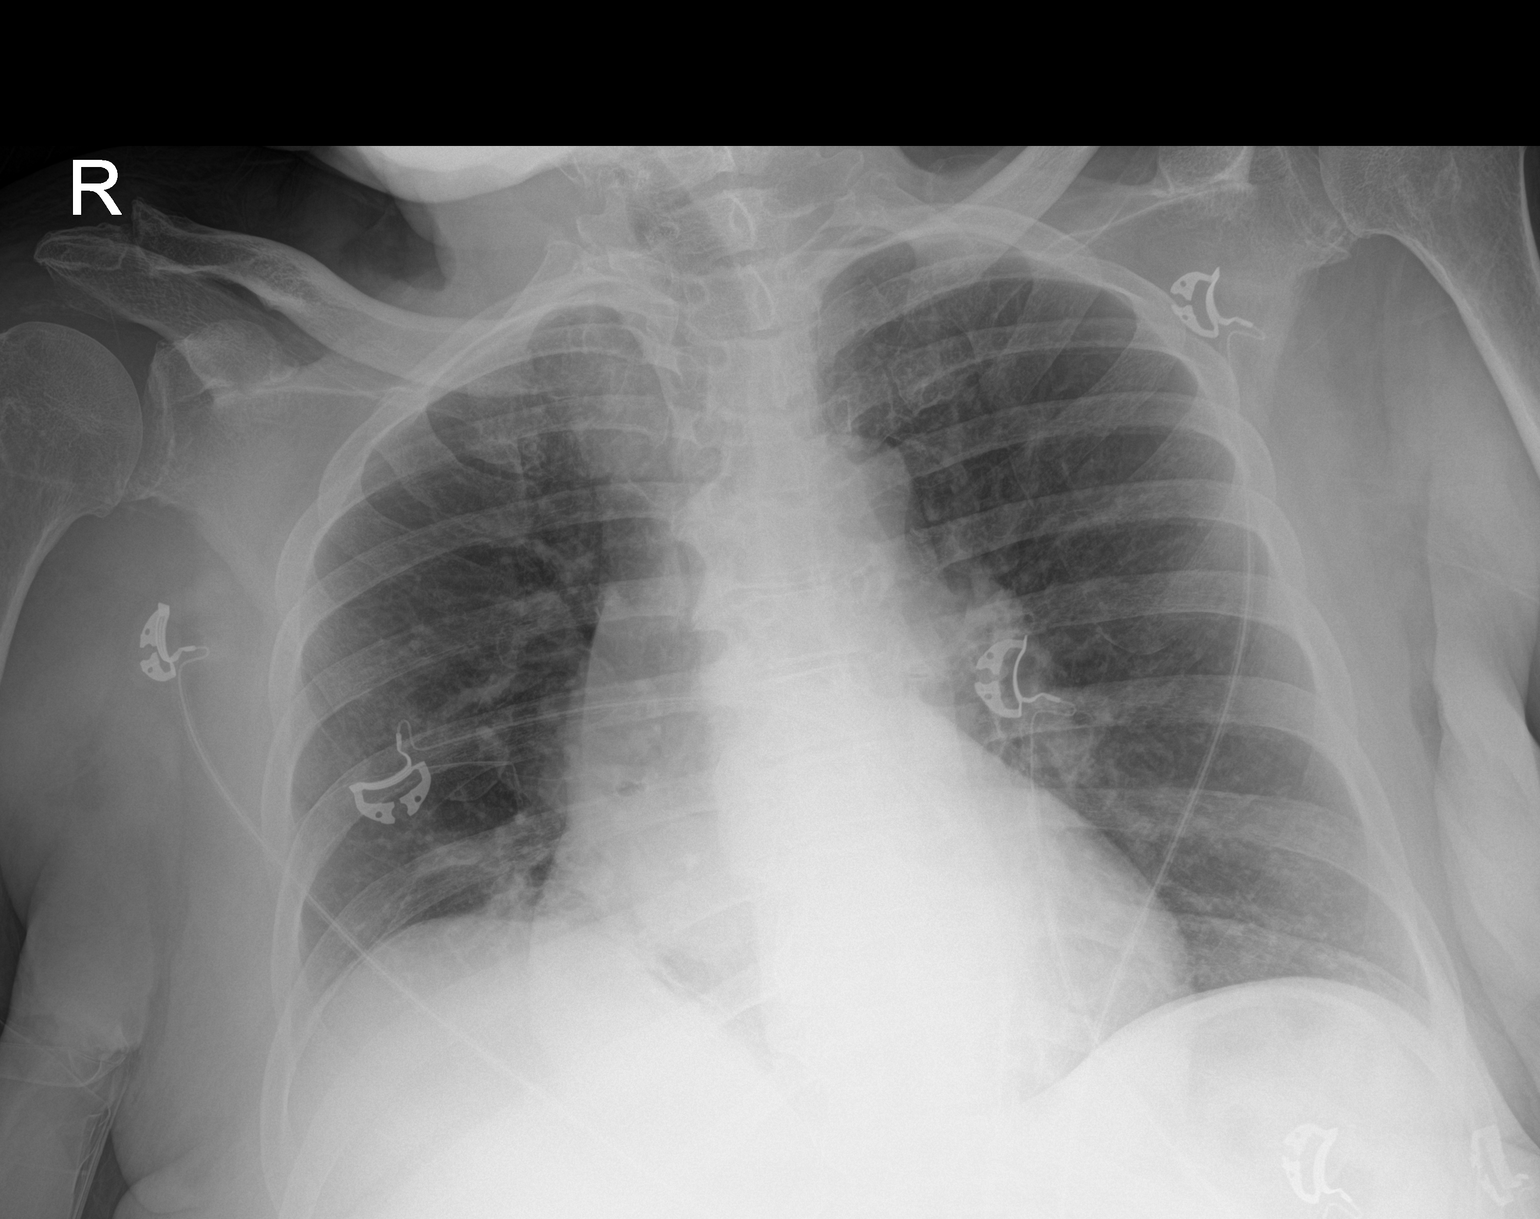

[1 of 1 positions shown; findings below may reference images not displayed]

FINDINGS: The heart size and mediastinal contours are within normal limits.
Both lungs are clear save for minimal right basilar atelectasis. The
visualized skeletal structures are unremarkable.
IMPRESSION: No active disease.

## 2021-02-15 IMAGING — DX DG ABDOMEN 1V
2 series · 2 of 2 positions shown · non-contrast
Comparison: [DATE]

CLINICAL DATA: Abdominal and bilateral groin pain

EXAM:
ABDOMEN - 1 VIEW

[abdomen kub (1 of 2)]
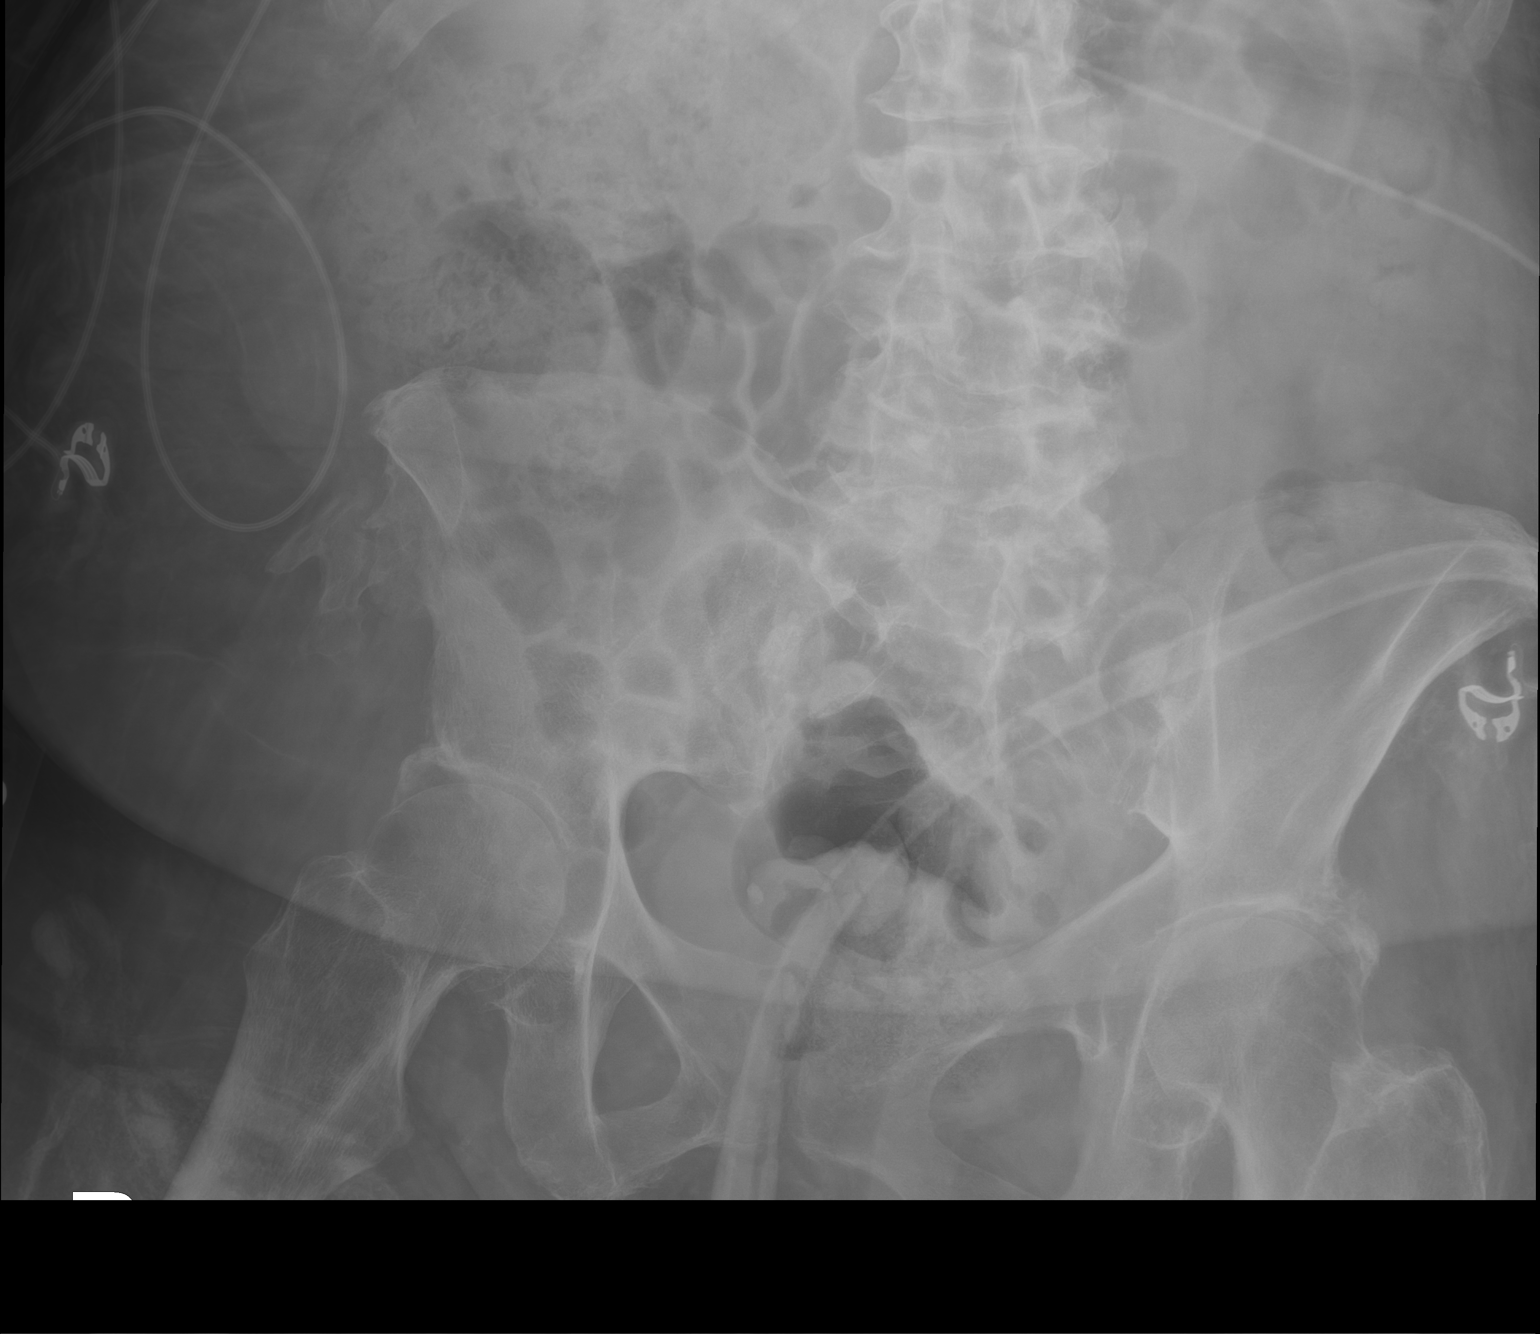

[abdomen kub (2 of 2)]
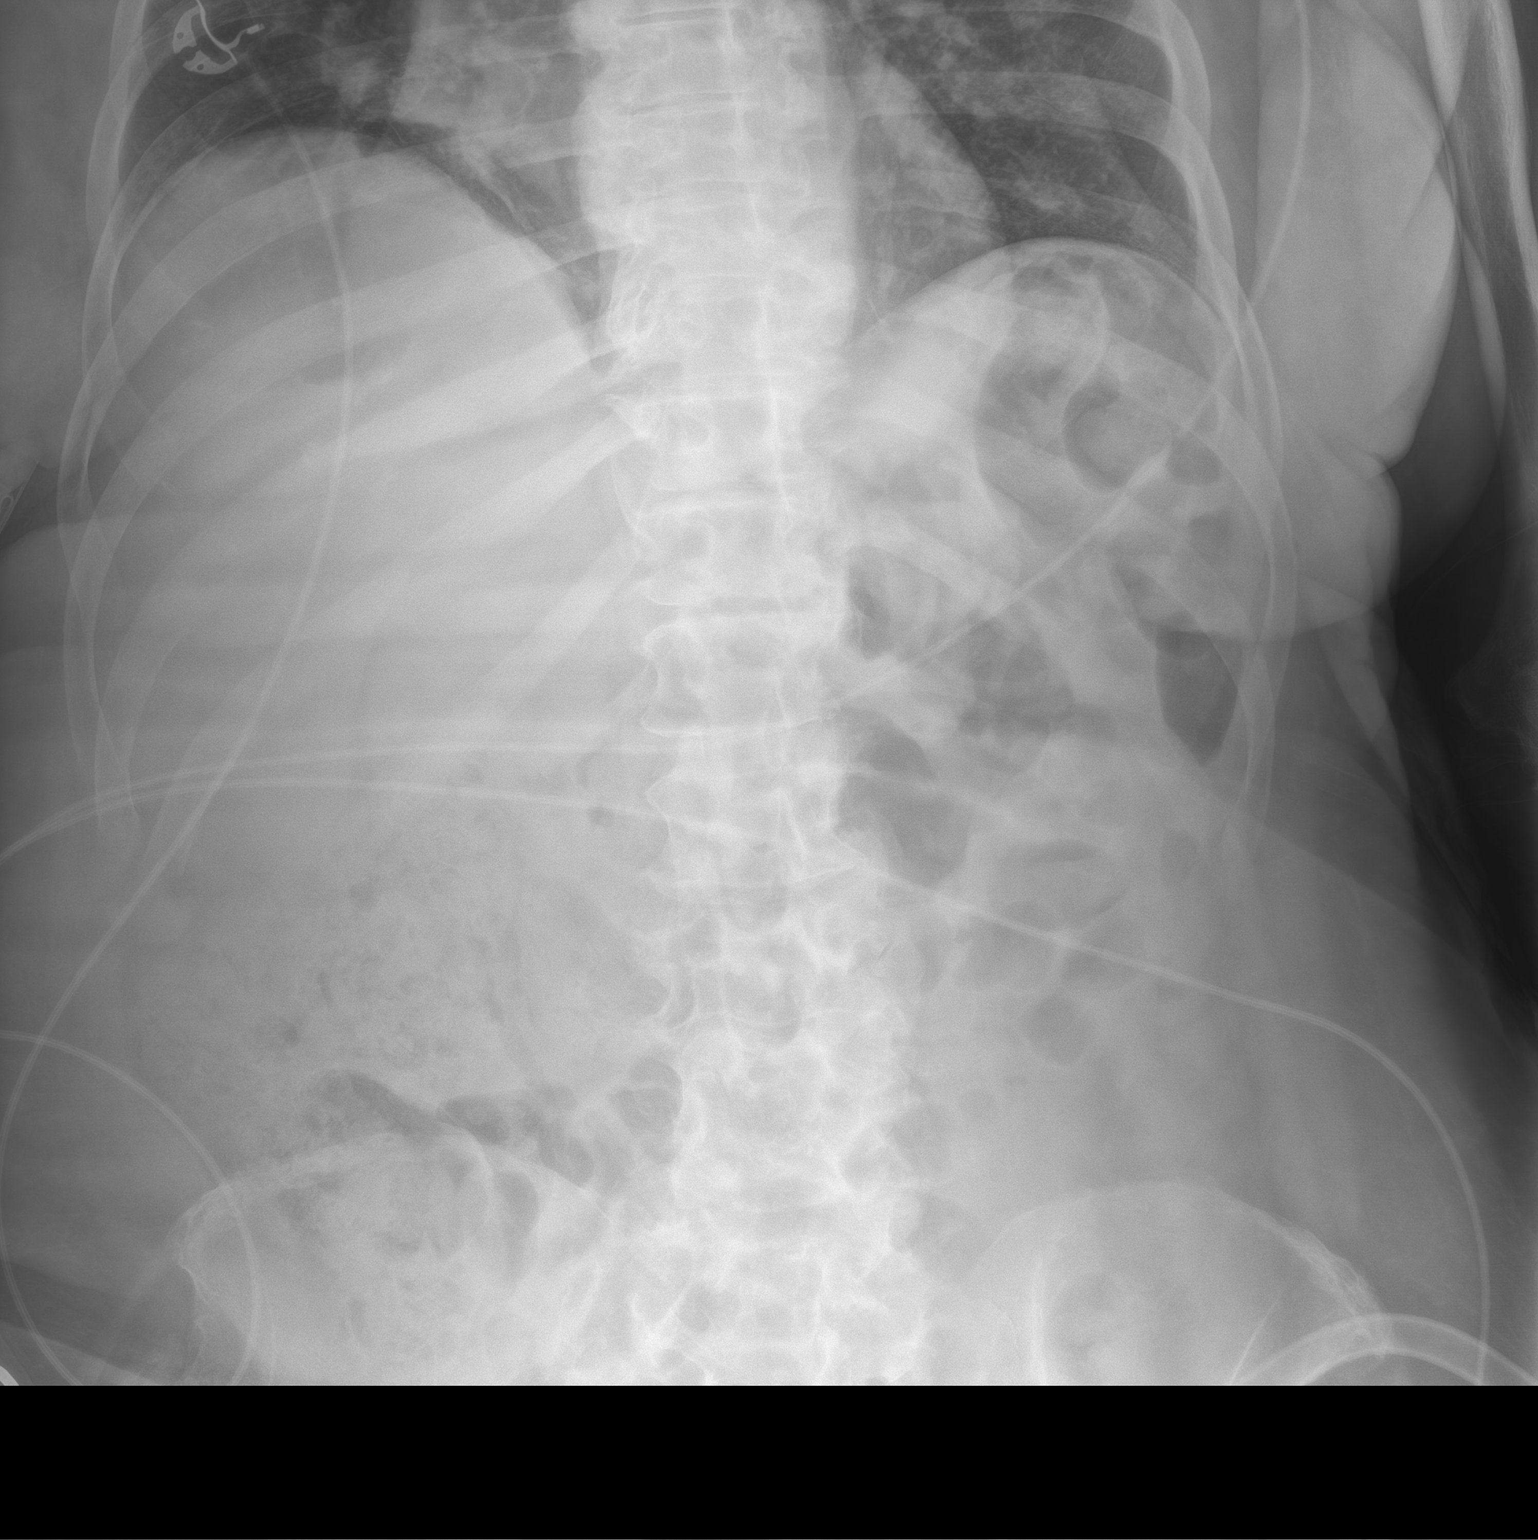

[2 of 2 positions shown; findings below may reference images not displayed]

FINDINGS: Moderate stool in the proximal colon. Bowel gas pattern is overall
normal. No concerning mass effect or calcification. Lucency at the
right lung bases attributed atelectasis based on preceding chest
x-ray.
IMPRESSION: Nonobstructive bowel gas pattern with moderate stool volume.

## 2021-02-15 MED ORDER — IBUPROFEN 200 MG PO TABS
600.0000 mg | ORAL_TABLET | Freq: Four times a day (QID) | ORAL | Status: DC | PRN
  Administered 2021-02-15: 600 mg via ORAL
  Filled 2021-02-15: qty 3

## 2021-02-15 MED ORDER — POTASSIUM CHLORIDE 10 MEQ/100ML IV SOLN
10.0000 meq | INTRAVENOUS | Status: AC
  Administered 2021-02-15 (×3): 10 meq via INTRAVENOUS
  Filled 2021-02-15 (×2): qty 100

## 2021-02-15 MED ORDER — METRONIDAZOLE 500 MG/100ML IV SOLN
500.0000 mg | Freq: Once | INTRAVENOUS | Status: AC
  Administered 2021-02-15: 500 mg via INTRAVENOUS
  Filled 2021-02-15: qty 100

## 2021-02-15 MED ORDER — SODIUM CHLORIDE 0.9 % IV SOLN
2.0000 g | Freq: Three times a day (TID) | INTRAVENOUS | Status: AC
  Administered 2021-02-15 – 2021-02-19 (×14): 2 g via INTRAVENOUS
  Filled 2021-02-15 (×14): qty 2

## 2021-02-15 MED ORDER — MAGNESIUM SULFATE 2 GM/50ML IV SOLN
2.0000 g | Freq: Once | INTRAVENOUS | Status: AC
  Administered 2021-02-15: 2 g via INTRAVENOUS
  Filled 2021-02-15: qty 50

## 2021-02-15 MED ORDER — THIAMINE HCL 100 MG PO TABS
100.0000 mg | ORAL_TABLET | Freq: Every day | ORAL | Status: DC
  Administered 2021-02-15 – 2021-02-20 (×6): 100 mg via ORAL
  Filled 2021-02-15 (×6): qty 1

## 2021-02-15 MED ORDER — POTASSIUM CHLORIDE CRYS ER 20 MEQ PO TBCR
40.0000 meq | EXTENDED_RELEASE_TABLET | Freq: Two times a day (BID) | ORAL | Status: AC
  Administered 2021-02-15 – 2021-02-16 (×4): 40 meq via ORAL
  Filled 2021-02-15 (×4): qty 2

## 2021-02-15 MED ORDER — ONDANSETRON HCL 4 MG/2ML IJ SOLN
4.0000 mg | Freq: Four times a day (QID) | INTRAMUSCULAR | Status: DC | PRN
  Administered 2021-02-17: 4 mg via INTRAVENOUS
  Filled 2021-02-15: qty 2

## 2021-02-15 MED ORDER — INSULIN ASPART 100 UNIT/ML IJ SOLN
0.0000 [IU] | Freq: Every day | INTRAMUSCULAR | Status: DC
  Administered 2021-02-19: 2 [IU] via SUBCUTANEOUS

## 2021-02-15 MED ORDER — POTASSIUM CHLORIDE 10 MEQ/100ML IV SOLN
INTRAVENOUS | Status: AC
  Filled 2021-02-15: qty 100

## 2021-02-15 MED ORDER — MORPHINE SULFATE (PF) 2 MG/ML IV SOLN
2.0000 mg | INTRAVENOUS | Status: DC | PRN
  Administered 2021-02-15 (×2): 2 mg via INTRAVENOUS
  Filled 2021-02-15 (×2): qty 1

## 2021-02-15 MED ORDER — FOLIC ACID 1 MG PO TABS
1.0000 mg | ORAL_TABLET | Freq: Every day | ORAL | Status: DC
  Administered 2021-02-15 – 2021-02-20 (×6): 1 mg via ORAL
  Filled 2021-02-15 (×6): qty 1

## 2021-02-15 MED ORDER — METRONIDAZOLE 500 MG/100ML IV SOLN
500.0000 mg | Freq: Two times a day (BID) | INTRAVENOUS | Status: DC
  Administered 2021-02-15 – 2021-02-16 (×2): 500 mg via INTRAVENOUS
  Filled 2021-02-15 (×2): qty 100

## 2021-02-15 MED ORDER — ONDANSETRON HCL 4 MG PO TABS: 4.0000 mg | ORAL_TABLET | Freq: Four times a day (QID) | ORAL | Status: DC | PRN

## 2021-02-15 MED ORDER — VANCOMYCIN HCL 2000 MG/400ML IV SOLN
2000.0000 mg | Freq: Once | INTRAVENOUS | Status: AC
  Administered 2021-02-15: 2000 mg via INTRAVENOUS
  Filled 2021-02-15: qty 400

## 2021-02-15 MED ORDER — LACTATED RINGERS IV BOLUS: 1000.0000 mL | Freq: Once | INTRAVENOUS | Status: DC

## 2021-02-15 MED ORDER — ACETAMINOPHEN 650 MG RE SUPP: 650.0000 mg | Freq: Four times a day (QID) | RECTAL | Status: DC | PRN

## 2021-02-15 MED ORDER — INSULIN ASPART 100 UNIT/ML IJ SOLN
0.0000 [IU] | Freq: Three times a day (TID) | INTRAMUSCULAR | Status: DC
  Administered 2021-02-15: 2 [IU] via SUBCUTANEOUS
  Administered 2021-02-16: 3 [IU] via SUBCUTANEOUS
  Administered 2021-02-17 (×2): 2 [IU] via SUBCUTANEOUS
  Administered 2021-02-18 – 2021-02-19 (×2): 3 [IU] via SUBCUTANEOUS
  Administered 2021-02-19 (×2): 5 [IU] via SUBCUTANEOUS
  Administered 2021-02-20 (×2): 3 [IU] via SUBCUTANEOUS

## 2021-02-15 MED ORDER — LACTATED RINGERS IV SOLN: INTRAVENOUS | Status: AC

## 2021-02-15 MED ORDER — ACETAMINOPHEN 500 MG PO TABS
1000.0000 mg | ORAL_TABLET | Freq: Once | ORAL | Status: AC
  Administered 2021-02-15: 1000 mg via ORAL
  Filled 2021-02-15: qty 2

## 2021-02-15 MED ORDER — VANCOMYCIN HCL IN DEXTROSE 1-5 GM/200ML-% IV SOLN: 1000.0000 mg | Freq: Once | INTRAVENOUS | Status: DC

## 2021-02-15 MED ORDER — POTASSIUM CHLORIDE 10 MEQ/100ML IV SOLN
INTRAVENOUS | Status: AC
  Administered 2021-02-15: 10 meq via INTRAVENOUS
  Filled 2021-02-15: qty 100

## 2021-02-15 MED ORDER — VANCOMYCIN HCL 1750 MG/350ML IV SOLN
1750.0000 mg | INTRAVENOUS | Status: DC
  Administered 2021-02-15: 1750 mg via INTRAVENOUS
  Filled 2021-02-15 (×2): qty 350

## 2021-02-15 MED ORDER — SENNOSIDES-DOCUSATE SODIUM 8.6-50 MG PO TABS
1.0000 | ORAL_TABLET | Freq: Every day | ORAL | Status: DC
  Administered 2021-02-15: 1 via ORAL
  Filled 2021-02-15: qty 1

## 2021-02-15 MED ORDER — SODIUM CHLORIDE 0.9 % IV SOLN
2.0000 g | Freq: Once | INTRAVENOUS | Status: AC
  Administered 2021-02-15: 2 g via INTRAVENOUS
  Filled 2021-02-15: qty 2

## 2021-02-15 MED ORDER — ADULT MULTIVITAMIN W/MINERALS CH
1.0000 | ORAL_TABLET | Freq: Every day | ORAL | Status: DC
  Administered 2021-02-15 – 2021-02-20 (×6): 1 via ORAL
  Filled 2021-02-15 (×6): qty 1

## 2021-02-15 MED ORDER — ACETAMINOPHEN 325 MG PO TABS
650.0000 mg | ORAL_TABLET | Freq: Four times a day (QID) | ORAL | Status: DC | PRN
  Administered 2021-02-16 – 2021-02-17 (×3): 650 mg via ORAL
  Filled 2021-02-15 (×3): qty 2

## 2021-02-15 MED ORDER — APIXABAN 2.5 MG PO TABS
2.5000 mg | ORAL_TABLET | Freq: Two times a day (BID) | ORAL | Status: DC
  Administered 2021-02-15 – 2021-02-20 (×10): 2.5 mg via ORAL
  Filled 2021-02-15 (×12): qty 1

## 2021-02-15 NOTE — Progress Notes (Signed)
Date and time results received: 02/15/21 1110 (use smartphrase ".now" to insert current time)  Test: Potassium Critical Value: 2.5  Name of Provider Notified: Cherylann Ratel DO  Orders Received? Or Actions Taken?: No new orders at this time

## 2021-02-15 NOTE — Progress Notes (Signed)
Pharmacy Antibiotic Note  [name] [name] is a 75 y.o. female admitted on 02/15/2021 with fevers of unknown origin (infectious vs rheumatological).  Pharmacy has been consulted for vancomycin dosing.  Plan: Vancomycin 2000 mg IV now, then 1750 mg IV q24 hr (est AUC 449 based on SCr 0.8; Vd 0.5) Measure vancomycin AUC at steady state as indicated SCr q48 while on vanc Cefepime/Flagyl per MD, dosing appropriate   Height: 5\' 11"  (180.3 cm) Weight: 127 kg (280 lb) IBW/kg (Calculated) : 70.8  Temp (24hrs), Avg:99 F (37.2 C), Min:98.2 F (36.8 C), Max:100.6 F (38.1 C)  Recent Labs  Lab 02/15/21 0142  WBC 12.3*  CREATININE 0.78  LATICACIDVEN 1.1    Estimated Creatinine Clearance: 90.9 mL/min (by C-G formula based on SCr of 0.78 mg/dL).    Allergies  Allergen Reactions   Oxycodone Hcl Nausea And Vomiting   Penicillins Itching    Remote reaction, no anaphylaxis or severe cutaneous symptoms Tolerates Cefepime      Thank you for allowing pharmacy to be a part of this patient's care.  [name] [name] A 02/15/2021 9:21 AM

## 2021-02-15 NOTE — H&P (Addendum)
History and Physical    [name] [name] AGT:[id number] DOB: 05/10/1945 DOA: 02/15/2021  PCP: Hoyt Koch, MD  Patient coming from: [facility]  Chief Complaint: fevers, aches  HPI: [name] [name] is a 75 y.o. female with medical history significant of HTN, DVT on eliquis, wernicke's, DM2. Presenting with fevers and body aches. Her symptoms started 4 days ago w/ general body aches and swelling in her hands. It made it difficult to grip things (especially with the left hand). She tried ibuprofen and APAP, but it did not help. She then noticed 2 days ago that she was having fevers. During this time, she had decreased appetite and some nausea. She did not have any respiratory symptoms. She had no diarrhea. Her fevers and aches persisted through yesterday. Her facility became concerned and sent her to the ED for evaluation.  Of note, she reports that she was recently treated for UTI. She is not complaining of any urinary symptoms at this time. In addition she notes that she has been constipated for the last 2 week. She denies any other aggravating or alleviating factors.    ED Course: She was started on broad spec abx for FUO. She was started on K+ replacement. TR[facility] was called for admission.   Review of Systems:  Denies CP, dyspnea, palpitations, abdominal pain, diarrhea, syncopal episodes, sick contacts. Review of systems is otherwise negative for all not mentioned in HPI.   PMHx Past Medical History:  Diagnosis Date   Acute kidney injury ([place])    Aortic stenosis, mild 11/17/2013   Arthritis    "both knees, spine, back, fingers" (11/29/2013)   CHF (congestive heart failure) (H[facility])    Edema    GERD (gastroesophageal reflux disease)    Heart murmur    History of diastolic dysfunction    Echo 05/2480 with diastolic dysfunction   HTN (hypertension)    Morbid obesity (H[facility])    OA (osteoarthritis)    PAC (premature atrial contraction)    per prior Holter   Palpitations     Pneumonia    "as a child"   PVC's (premature ventricular contractions)    per prior Holter   SVT (supraventricular tachycardia) (HCC)    Tachycardia    noted at 07/28/11 visit. Started on beta blocker. Possible atrial flutter vs long PR tachycardia/AVNRT   Type II diabetes mellitus ([place])     PSHx Past Surgical History:  Procedure Laterality Date   BIOPSY  03/23/2020   Procedure: BIOPSY;  Surgeon: Juanita Craver, MD;  Location: [facility];  Service: Endoscopy;;   [place]   ESOPHAGOGASTRODUODENOSCOPY (EGD) WITH PROPOFOL N/A 03/23/2020   Procedure: ESOPHAGOGASTRODUODENOSCOPY (EGD) WITH PROPOFOL;  Surgeon: Juanita Craver, MD;  Location: [facility] ENDOSCOPY;  Service: Endoscopy;  Laterality: N/A;   SUPRAVENTRICULAR TACHYCARDIA ABLATION  11/29/2013   SUPRAVENTRICULAR TACHYCARDIA ABLATION N/A 11/29/2013   Procedure: SUPRAVENTRICULAR TACHYCARDIA ABLATION;  Surgeon: Evans Lance, MD;  Location: [facility] CATH LAB;  Service: Cardiovascular;  Laterality: N/A;   US ECHOCARDIOGRAPHY  07/25/2008   EF 55-60%   VAGINAL DELIVERY      SocHx  reports that she quit smoking about 35 years ago. Her smoking use included cigarettes. She has a 30.00 pack-year smoking history. She has never used smokeless tobacco. She reports that she does not drink alcohol and does not use drugs.  Allergies  Allergen Reactions   Oxycodone Hcl Nausea And Vomiting   Penicillins Itching    Has patient had  a PCN reaction causing immediate rash, facial/tongue/throat swelling, SOB or lightheadedness with hypotension: No Has patient had a PCN reaction causing severe rash involving mucus membranes or skin necrosis: No Has patient had a PCN reaction that required hospitalization: No Has patient had a PCN reaction occurring within the last 10 years: No If all of the above answers are "NO", then may proceed with Cephalosporin use.    FamHx Family History  Problem Relation Age of Onset   Alzheimer's  disease Mother    Lung cancer Mother    COPD Father    Diabetes Maternal Uncle    Stroke Neg Hx    Colon cancer Neg Hx    Stomach cancer Neg Hx    Esophageal cancer Neg Hx     Prior to Admission medications   Medication Sig Start Date End Date Taking? Authorizing Provider  Albuterol Sulfate (PROAIR RESPICLICK) 062 (90 Base) MCG/ACT AEPB Inhale into the lungs. 2 puffs every 6 hours as needed    [provider]  apixaban (ELIQUIS) 2.5 MG TABS tablet Take by mouth 2 (two) times daily.    [provider]  collagenase (SANTYL) ointment Apply topically daily. 04/09/20   Elodia Florence., MD  dextromethorphan-guaiFENesin [facility] DM) 30-600 MG 12hr tablet Take 1 tablet by mouth 2 (two) times daily.    [provider]  diclofenac Sodium (VOLTAREN) 1 % GEL Apply topically 4 (four) times daily.    [provider]  feeding supplement, ENSURE COMPLETE, (ENSURE COMPLETE) LIQD Take 237 mLs by mouth 2 (two) times daily between meals. 07/28/19   Hoyt Koch, MD  folic acid (FOLVITE) 1 MG tablet Take 1 mg by mouth daily.    [provider]  metFORMIN (GLUCOPHAGE) 500 MG tablet Take 1 tablet (500 mg total) by mouth daily with breakfast. 05/08/19   Hoyt Koch, MD  metoCLOPramide (REGLAN) 5 MG tablet Take 1 tablet (5 mg total) by mouth 3 (three) times daily as needed for nausea. Patient not taking: No sig reported 02/27/20   Hoyt Koch, MD  METOPROLOL TARTRATE PO Take 25 mg by mouth daily.    [provider]  montelukast (SINGULAIR) 10 MG tablet Take 1 tablet (10 mg total) by mouth at bedtime. 07/28/19   Hoyt Koch, MD  Multiple Vitamin (MULTIVITAMIN WITH MINERALS) TABS tablet Take 1 tablet by mouth daily. 03/26/20   Caren Griffins, MD  nutrition supplement, JUVEN, (JUVEN) PACK Take 1 packet by mouth 2 (two) times daily between meals. 07/28/19   Hoyt Koch, MD  ondansetron (ZOFRAN ODT) 4 MG  disintegrating tablet Take 1 tablet (4 mg total) by mouth every 8 (eight) hours as needed for nausea or vomiting. 03/08/20   Hoyt Koch, MD  pantoprazole (PROTONIX) 40 MG tablet TAKE 1 TABLET BY MOUTH EVERY DAY Patient not taking: No sig reported 05/24/20   Hoyt Koch, MD  promethazine (PHENERGAN) 25 MG suppository Place 1 suppository (25 mg total) rectally every 6 (six) hours as needed for nausea or vomiting. 02/27/20   Hoyt Koch, MD  sucralfate (CARAFATE) 1 GM/10ML suspension Take 10 mLs (1 g total) by mouth 4 (four) times daily -  with meals and at bedtime. 03/26/20   Caren Griffins, MD  thiamine 100 MG tablet Take 1 tablet (100 mg total) by mouth daily. 03/26/20   Caren Griffins, MD    Physical Exam: Vitals:   02/15/21 0445 02/15/21 0500 02/15/21 0515 02/15/21 0530  BP: (!) 162/72 (!) 150/71 (!) 154/78 (!) 150/80  Pulse: 100 (!) 101 96 97  Resp: (!) 22 (!) '26 15 16  ' Temp:      TempSrc:      SpO2: 95% 95% 96% 96%  Weight:      Height:        General: 75 y.o. female resting in bed in NAD Eyes: PERRL, normal sclera ENMT: Nares patent w/o discharge, orophaynx clear, dentition normal, ears w/o discharge/lesions/ulcers Neck: Supple, trachea midline Cardiovascular: RRR, +S1, S2, no m/g/r, equal pulses throughout Respiratory: CTABL, no w/r/r, normal WOB GI: BS+, NDNT, no masses noted, no organomegaly noted MSK: No c/c; b/l hand swelling (L>R); TTP left hand w/ more difficulty in closing; chronic venous changes of the BLE w/ lymphedema noted Neuro: A&O x 3, chronic BLE weakness Psyc: Appropriate interaction and affect, calm/cooperative  Labs on Admission: I have personally reviewed following labs and imaging studies  CBC: Recent Labs  Lab 02/15/21 0142  WBC 12.3*  NEUTROABS 8.9*  HGB 9.0*  HCT 28.8*  MCV 72.7*  PLT 314*   Basic Metabolic Panel: Recent Labs  Lab 02/15/21 0142  NA 140  K 2.3*  CL 101  CO2 29  GLUCOSE 139*  BUN 13   CREATININE 0.78  CALCIUM 8.7*   GFR: Estimated Creatinine Clearance: 90.9 mL/min (by C-G formula based on SCr of 0.78 mg/dL). Liver Function Tests: Recent Labs  Lab 02/15/21 0142  AST 30  ALT 21  ALKPHOS 95  BILITOT 1.0  PROT 6.9  ALBUMIN 2.4*   No results for input(s): LIPASE, AMYLASE in the last 168 hours. No results for input(s): AMMONIA in the last 168 hours. Coagulation Profile: Recent Labs  Lab 02/15/21 0142  INR 1.5*   Cardiac Enzymes: No results for input(s): CKTOTAL, CKMB, CKMBINDEX, TROPONINI in the last 168 hours. BNP (last 3 results) No results for input(s): PROBNP in the last 8760 hours. HbA1C: No results for input(s): HGBA1C in the last 72 hours. CBG: No results for input(s): GLUCAP in the last 168 hours. Lipid Profile: No results for input(s): CHOL, HDL, LDLCALC, TRIG, CHOLHDL, LDLDIRECT in the last 72 hours. Thyroid Function Tests: No results for input(s): TSH, T4TOTAL, FREET4, T3FREE, THYROIDAB in the last 72 hours. Anemia Panel: No results for input(s): VITAMINB12, FOLATE, FERRITIN, TIBC, IRON, RETICCTPCT in the last 72 hours. Urine analysis:    Component Value Date/Time   COLORURINE AMBER (A) 02/15/2021 0356   APPEARANCEUR HAZY (A) 02/15/2021 0356   LABSPEC 1.033 (H) 02/15/2021 0356   PHURINE 5.0 02/15/2021 0356   GLUCOSEU NEGATIVE 02/15/2021 0356   GLUCOSEU NEGATIVE 02/02/2020 1220   HGBUR MODERATE (A) 02/15/2021 0356   BILIRUBINUR SMALL (A) 02/15/2021 0356   BILIRUBINUR Neg 10/22/2017 0950   KETONESUR 20 (A) 02/15/2021 0356   PROTEINUR 100 (A) 02/15/2021 0356   UROBILINOGEN 0.2 02/02/2020 1220   NITRITE NEGATIVE 02/15/2021 0356   LEUKOCYTESUR TRACE (A) 02/15/2021 0356    Radiological Exams on Admission: DG Chest Port 1 View  Result Date: 02/15/2021 CLINICAL DATA:  Sepsis EXAM: PORTABLE CHEST 1 VIEW COMPARISON:  03/28/2020 FINDINGS: The heart size and mediastinal contours are within normal limits. Both lungs are clear save for minimal  right basilar atelectasis. The visualized skeletal structures are unremarkable. IMPRESSION: No active disease. Electronically Signed   By: Fidela Salisbury M.D.   On: 02/15/2021 01:52    EKG: Independently reviewed. Sinus tach  Assessment/Plan Fever of unknown origin SIRS     - admitted to  inpt, tele     - SIRS: fever, leukocytosis     - started on broad spec abx in ED     - follow UCx and Bld Cx     - no resp symptoms; COVID/flu negative, CXR negative     - recently treated for UTI     - ?rheumatological component or gout component?; check CRP, ESR, uric acid, ANA, ANCA  B/l hand swelling     - left is worse than right, difficult to close hand; checking uric acid and rheum markers  Hypokalemia     - replace K+, check Mg2+     - follow up renal function panel at noon     - UPDATE: K+ low on last renal function, repeat renal fxn panel, continuing K+ replace PO and IV  Constipation Nausea Decreased appetite     - check KUB     - add anti-emetics     - dietician consult  Hypoalbuminemia Proteinuria     - check protein/creatinine ratio, compliment, ANA/ANCA     - her renal function is ok  HTN     - resume home regimen  DM2     - SSI, A1c, DM diet, glucose checks  Microcytic anemia     - check iron studies     - no evidence of bleed  GERD     - PPI  Hx of DVT     - on eliquis  Hx of Wernicke's Cervical spinal stenosis     - uses WC/assistive devices     - continue daily thiamine  DVT prophylaxis: Eliquis  Code Status: FULL  Family Communication: Attempted called to dtr [employer]); received VM only  Consults called: None   Status is: Inpatient  Remains inpatient appropriate because: severity of illness  Kathrynne Kulinski A Marylyn Ishihara DO [facility]  If 7PM-7AM, please contact night-coverage [email or web address]  02/15/2021, 7:13 AM

## 2021-02-15 NOTE — ED Triage Notes (Signed)
Pt c/o pain all over, lower back, shoulders, legs & swelling. Generalized weakness. EMS reports nursing facility called them for to come out due to fever and pain.

## 2021-02-15 NOTE — ED Provider Notes (Signed)
[facility] DEPT Provider Note   CSN: [id number] Arrival date & time: 02/15/21  0039     History Chief Complaint  Patient presents with   Pain    Pt c/o pain all over, lower back, shoulders, legs & swelling. Generalized weakness. EMS reports nursing facility called them for to come out due to fever and pain.     [name] [name] is a 75 y.o. female.  The history is provided by the patient and the EMS personnel.  Illness Location:  At nursing home Quality:  Fevers for several days and now pain all over.  Reports being treated for UTI but doesn't know if she completed all antibiotics Severity:  Moderate Onset quality:  Gradual Duration: days. Timing:  Constant Progression:  Worsening Chronicity:  New Context:  At nursing home. Wears depends Relieved by:  Nothing Worsened by:  Nothing Ineffective treatments:  None Associated symptoms: congestion and fever   Associated symptoms: no abdominal pain, no loss of consciousness, no rash, no rhinorrhea, no shortness of breath, no sore throat and no wheezing   Associated symptoms comment:  Urine burns to thighs  Patient with complex PMH who presents for several days of fever and now pain all over.  Reports being treated for UTI but does not know if she is still on antibiotics.     Past Medical History:  Diagnosis Date   Acute kidney injury ([place])    Aortic stenosis, mild 11/17/2013   Arthritis    "both knees, spine, back, fingers" (11/29/2013)   CHF (congestive heart failure) (HCC)    Edema    GERD (gastroesophageal reflux disease)    Heart murmur    History of diastolic dysfunction    Echo 08/4268 with diastolic dysfunction   HTN (hypertension)    Morbid obesity (HCC)    OA (osteoarthritis)    PAC (premature atrial contraction)    per prior Holter   Palpitations    Pneumonia    "as a child"   PVC's (premature ventricular contractions)    per prior Holter   SVT (supraventricular tachycardia)  (HCC)    Tachycardia    noted at 07/28/11 visit. Started on beta blocker. Possible atrial flutter vs long PR tachycardia/AVNRT   Type II diabetes mellitus ([place])     Patient Active Problem List   Diagnosis Date Noted   Polyarthralgia    Wernicke's aphasia    Bilateral leg ulcer, limited to breakdown of skin ([place]) 01/16/2020   Allergic rhinitis 07/28/2019   Postnasal drip 11/16/2017   Sensorineural hearing loss (SNHL), bilateral 11/16/2017   Tinnitus of both ears 11/16/2017   Obesity, Class II, BMI 35-39.9, isolated (see actual BMI) 11/08/2017   CKD (chronic kidney disease), stage III ([place]) 11/08/2017   Anemia 11/07/2017   Thrombocytosis 11/07/2017   Lymphedema 11/07/2017   AKI (acute kidney injury) ([place]) 10/16/2017   Routine general medical examination at a health care facility 05/10/2014   Paroxysmal SVT (supraventricular tachycardia) ([place]) 11/28/2013   Physical deconditioning 11/23/2013   Generalized weakness 11/17/2013   Multilevel spine pain 11/17/2013   Aortic stenosis, mild 11/17/2013   DM type 2 (diabetes mellitus, type 2) ([place]) 02/02/2013   Arthritis 10/19/2012   Mixed incontinence 10/19/2012   GERD (gastroesophageal reflux disease) 04/20/2011   HTN (hypertension) 01/20/2011   Morbid obesity ([place])     Past Surgical History:  Procedure Laterality Date   BIOPSY  03/23/2020   Procedure: BIOPSY;  Surgeon: Juanita Craver, MD;  Location: [facility];  Service: Endoscopy;;   [place]   ESOPHAGOGASTRODUODENOSCOPY (EGD) WITH PROPOFOL N/A 03/23/2020   Procedure: ESOPHAGOGASTRODUODENOSCOPY (EGD) WITH PROPOFOL;  Surgeon: Juanita Craver, MD;  Location: [facility] ENDOSCOPY;  Service: Endoscopy;  Laterality: N/A;   SUPRAVENTRICULAR TACHYCARDIA ABLATION  11/29/2013   SUPRAVENTRICULAR TACHYCARDIA ABLATION N/A 11/29/2013   Procedure: SUPRAVENTRICULAR TACHYCARDIA ABLATION;  Surgeon: Evans Lance, MD;  Location[facility] CATH LAB;  Service: Cardiovascular;   Laterality: N/A;   US ECHOCARDIOGRAPHY  07/25/2008   EF 55-60%   VAGINAL DELIVERY       OB History   No obstetric history on file.     Family History  Problem Relation Age of Onset   Alzheimer's disease Mother    Lung cancer Mother    COPD Father    Diabetes Maternal Uncle    Stroke Neg Hx    Colon cancer Neg Hx    Stomach cancer Neg Hx    Esophageal cancer Neg Hx     Social History   Tobacco Use   Smoking status: Former    Packs/day: 2.00    Years: 15.00    Pack years: 30.00    Types: Cigarettes    Quit date: 11/23/1985    Years since quitting: 35.2   Smokeless tobacco: Never  Vaping Use   Vaping Use: Never used  Substance Use Topics   Alcohol use: No   Drug use: No    Home Medications Prior to Admission medications   Medication Sig Start Date End Date Taking? Authorizing Provider  Albuterol Sulfate (PROAIR RESPICLICK) 962 (90 Base) MCG/ACT AEPB Inhale into the lungs. 2 puffs every 6 hours as needed    [provider]  apixaban (ELIQUIS) 2.5 MG TABS tablet Take by mouth 2 (two) times daily.    [provider]  collagenase (SANTYL) ointment Apply topically daily. 04/09/20   Elodia Florence., MD  dextromethorphan-guaiFENesin [facility] DM) 30-600 MG 12hr tablet Take 1 tablet by mouth 2 (two) times daily.    [provider]  diclofenac Sodium (VOLTAREN) 1 % GEL Apply topically 4 (four) times daily.    [provider]  feeding supplement, ENSURE COMPLETE, (ENSURE COMPLETE) LIQD Take 237 mLs by mouth 2 (two) times daily between meals. 07/28/19   Hoyt Koch, MD  folic acid (FOLVITE) 1 MG tablet Take 1 mg by mouth daily.    [provider]  metFORMIN (GLUCOPHAGE) 500 MG tablet Take 1 tablet (500 mg total) by mouth daily with breakfast. 05/08/19   Hoyt Koch, MD  metoCLOPramide (REGLAN) 5 MG tablet Take 1 tablet (5 mg total) by mouth 3 (three) times daily as needed for nausea. Patient not taking: No sig  reported 02/27/20   Hoyt Koch, MD  METOPROLOL TARTRATE PO Take 25 mg by mouth daily.    [provider]  montelukast (SINGULAIR) 10 MG tablet Take 1 tablet (10 mg total) by mouth at bedtime. 07/28/19   Hoyt Koch, MD  Multiple Vitamin (MULTIVITAMIN WITH MINERALS) TABS tablet Take 1 tablet by mouth daily. 03/26/20   Caren Griffins, MD  nutrition supplement, JUVEN, (JUVEN) PACK Take 1 packet by mouth 2 (two) times daily between meals. 07/28/19   Hoyt Koch, MD  ondansetron (ZOFRAN ODT) 4 MG disintegrating tablet Take 1 tablet (4 mg total) by mouth every 8 (eight) hours as needed for nausea or vomiting. 03/08/20   Hoyt Koch, MD  pantoprazole (PROTONIX) 40 MG  tablet TAKE 1 TABLET BY MOUTH EVERY DAY Patient not taking: No sig reported 05/24/20   Hoyt Koch, MD  promethazine (PHENERGAN) 25 MG suppository Place 1 suppository (25 mg total) rectally every 6 (six) hours as needed for nausea or vomiting. 02/27/20   Hoyt Koch, MD  sucralfate (CARAFATE) 1 GM/10ML suspension Take 10 mLs (1 g total) by mouth 4 (four) times daily -  with meals and at bedtime. 03/26/20   Caren Griffins, MD  thiamine 100 MG tablet Take 1 tablet (100 mg total) by mouth daily. 03/26/20   Caren Griffins, MD    Allergies    Oxycodone hcl and Penicillins  Review of Systems   Review of Systems  Constitutional:  Positive for fever.  HENT:  Positive for congestion. Negative for rhinorrhea and sore throat.   Eyes:  Negative for redness.  Respiratory:  Negative for shortness of breath and wheezing.   Cardiovascular:  Negative for palpitations.  Gastrointestinal:  Negative for abdominal pain.  Genitourinary:  Positive for dysuria. Negative for difficulty urinating.  Skin:  Negative for rash.  Neurological:  Negative for loss of consciousness and facial asymmetry.  Psychiatric/Behavioral:  Negative for agitation.   All other systems reviewed and are  negative.  Physical Exam Updated Vital Signs BP (!) 155/84   Pulse (!) 102   Temp (!) 100.6 F (38.1 C) (Oral)   Resp 18   Ht 5\' 11"  (1.803 m)   Wt 127 kg   SpO2 97%   BMI 39.05 kg/m   Physical Exam Vitals and nursing note reviewed. Exam conducted with a chaperone present.  Constitutional:      Appearance: Normal appearance. She is not diaphoretic.  HENT:     Head: Normocephalic and atraumatic.     Nose: Nose normal.  Eyes:     Conjunctiva/sclera: Conjunctivae normal.     Pupils: Pupils are equal, round, and reactive to light.  Cardiovascular:     Rate and Rhythm: Regular rhythm. Tachycardia present.     Pulses: Normal pulses.     Heart sounds: Normal heart sounds.  Pulmonary:     Effort: Pulmonary effort is normal.     Breath sounds: Normal breath sounds.  Abdominal:     General: Abdomen is flat. Bowel sounds are normal.     Palpations: Abdomen is soft.     Tenderness: There is no abdominal tenderness.  Musculoskeletal:     Right lower leg: No edema.     Left lower leg: No edema.  Skin:    General: Skin is warm and dry.     Capillary Refill: Capillary refill takes less than 2 seconds.  Neurological:     General: No focal deficit present.     Mental Status: She is alert.     Deep Tendon Reflexes: Reflexes normal.  Psychiatric:        Mood and Affect: Mood normal.        Behavior: Behavior normal.    ED Results / Procedures / Treatments   Labs (all labs ordered are listed, but only abnormal results are displayed) Results for orders placed or performed during the hospital encounter of 02/15/21  Lactic acid, plasma  Result Value Ref Range   Lactic Acid, Venous 1.1 0.5 - 1.9 mmol/L  Comprehensive metabolic panel  Result Value Ref Range   Sodium 140 135 - 145 mmol/L   Potassium 2.3 (LL) 3.5 - 5.1 mmol/L   Chloride 101 98 - 111 mmol/L  CO2 29 22 - 32 mmol/L   Glucose, Bld 139 (H) 70 - 99 mg/dL   BUN 13 8 - 23 mg/dL   Creatinine, Ser 0.78 0.44 - 1.00 mg/dL    Calcium 8.7 (L) 8.9 - 10.3 mg/dL   Total Protein 6.9 6.5 - 8.1 g/dL   Albumin 2.4 (L) 3.5 - 5.0 g/dL   AST 30 15 - 41 U/L   ALT 21 0 - 44 U/L   Alkaline Phosphatase 95 38 - 126 U/L   Total Bilirubin 1.0 0.3 - 1.2 mg/dL   GFR, Estimated >60 >60 mL/min   Anion gap 10 5 - 15  CBC WITH DIFFERENTIAL  Result Value Ref Range   WBC 12.3 (H) 4.0 - 10.5 K/uL   RBC 3.96 3.87 - 5.11 MIL/uL   Hemoglobin 9.0 (L) 12.0 - 15.0 g/dL   HCT 28.8 (L) 36.0 - 46.0 %   MCV 72.7 (L) 80.0 - 100.0 fL   MCH 22.7 (L) 26.0 - 34.0 pg   MCHC 31.3 30.0 - 36.0 g/dL   RDW 19.9 (H) 11.5 - 15.5 %   Platelets 510 (H) 150 - 400 K/uL   nRBC 0.0 0.0 - 0.2 %   Neutrophils Relative % 72 %   Neutro Abs 8.9 (H) 1.7 - 7.7 K/uL   Lymphocytes Relative 14 %   Lymphs Abs 1.8 0.7 - 4.0 K/uL   Monocytes Relative 12 %   Monocytes Absolute 1.4 (H) 0.1 - 1.0 K/uL   Eosinophils Relative 0 %   Eosinophils Absolute 0.0 0.0 - 0.5 K/uL   Basophils Relative 1 %   Basophils Absolute 0.1 0.0 - 0.1 K/uL   Immature Granulocytes 1 %   Abs Immature Granulocytes 0.07 0.00 - 0.07 K/uL  Protime-INR  Result Value Ref Range   Prothrombin Time 18.4 (H) 11.4 - 15.2 seconds   INR 1.5 (H) 0.8 - 1.2  APTT  Result Value Ref Range   aPTT 46 (H) 24 - 36 seconds   DG Chest Port 1 View  Result Date: 02/15/2021 CLINICAL DATA:  Sepsis EXAM: PORTABLE CHEST 1 VIEW COMPARISON:  03/28/2020 FINDINGS: The heart size and mediastinal contours are within normal limits. Both lungs are clear save for minimal right basilar atelectasis. The visualized skeletal structures are unremarkable. IMPRESSION: No active disease. Electronically Signed   By: Fidela Salisbury M.D.   On: 02/15/2021 01:52     EKG See muse   Radiology DG Chest [facility] 1 View  Result Date: 02/15/2021 CLINICAL DATA:  Sepsis EXAM: PORTABLE CHEST 1 VIEW COMPARISON:  03/28/2020 FINDINGS: The heart size and mediastinal contours are within normal limits. Both lungs are clear save for minimal right  basilar atelectasis. The visualized skeletal structures are unremarkable. IMPRESSION: No active disease. Electronically Signed   By: Fidela Salisbury M.D.   On: 02/15/2021 01:52    Procedures Procedures   Medications Ordered in ED Medications  lactated ringers infusion ( Intravenous New Bag/Given 02/15/21 0230)  metroNIDAZOLE (FLAGYL) IVPB 500 mg (500 mg Intravenous New Bag/Given 02/15/21 0356)  vancomycin (VANCOREADY) IVPB 2000 mg/400 mL (has no administration in time range)  potassium chloride 10 mEq in 100 mL IVPB (10 mEq Intravenous New Bag/Given 02/15/21 0355)  lactated ringers bolus 1,000 mL (has no administration in time range)  ceFEPIme (MAXIPIME) 2 g in sodium chloride 0.9 % 100 mL IVPB (0 g Intravenous Stopped 02/15/21 0354)  acetaminophen (TYLENOL) tablet 1,000 mg (1,000 mg Oral Given 02/15/21 0210)    ED Course  I  have reviewed the triage vital signs and the nursing notes.  Pertinent labs & imaging results that were available during my care of the patient were reviewed by me and considered in my medical decision making (see chart for details).   MDM Reviewed: previous chart, nursing note and vitals Interpretation: labs, ECG and x-ray (elevated ehite count negative lactate.  NACPD) Total time providing critical care: 30-74 minutes (sepsis bundle). This excludes time spent performing separately reportable procedures and services. Consults: admitting MD  CRITICAL CARE Performed by: Jamile Rekowski K Christorpher Hisaw-Rasch Total critical care tim e 60 minutes Critical care time was exclusive of separately billable procedures and treating other patients. Critical care was necessary to treat or prevent imminent or life-threatening deterioration. Critical care was time spent personally by me on the following activities: development of treatment plan with patient and/or surrogate as well as nursing, discussions with consultants, evaluation of patient's response to treatment, examination of patient,  obtaining history from patient or surrogate, ordering and performing treatments and interventions, ordering and review of laboratory studies, ordering and review of radiographic studies, pulse oximetry and re-evaluation of patient's condition.   Final Clinical Impression(s) / ED Diagnoses Final diagnoses:  Sepsis, due to unspecified organism, unspecified whether acute organ dysfunction present [facility])  Hypokalemia   Admit  Rx / DC Orders ED Discharge Orders     None        Alyan Hartline, MD 02/15/21 [phone]

## 2021-02-15 NOTE — ED Notes (Signed)
FLOAT NURSE 

## 2021-02-15 NOTE — Progress Notes (Signed)
A consult was received from an ED physician for Cefepime + Vancomycin per pharmacy dosing.  The patient's profile has been reviewed for ht/wt/allergies/indication/available labs.   A one time order has been placed for Cefepime 2gm + Vancomycin 2gm IV.  Further antibiotics/pharmacy consults should be ordered by admitting physician if indicated.                       Thank you, Netta Cedars PharmD 02/15/2021  1:23 AM

## 2021-02-15 NOTE — Sepsis Progress Note (Signed)
Monitoring for the code sepsis protocol. °

## 2021-02-15 NOTE — ED Notes (Signed)
Patient incontinent of urine on arrival.  Adult brief removed and external catheter placed.  Patient has blisters and open wounds to inner R thigh.

## 2021-02-16 ENCOUNTER — Inpatient Hospital Stay (HOSPITAL_COMMUNITY): Payer: PPO

## 2021-02-16 DIAGNOSIS — M1120 Other chondrocalcinosis, unspecified site: Secondary | ICD-10-CM

## 2021-02-16 DIAGNOSIS — A419 Sepsis, unspecified organism: Principal | ICD-10-CM

## 2021-02-16 DIAGNOSIS — E876 Hypokalemia: Secondary | ICD-10-CM

## 2021-02-16 LAB — COMPREHENSIVE METABOLIC PANEL
ALT: 17 U/L (ref 0–44)
AST: 16 U/L (ref 15–41)
Albumin: 2.2 g/dL — ABNORMAL LOW (ref 3.5–5.0)
Alkaline Phosphatase: 82 U/L (ref 38–126)
Anion gap: 6 (ref 5–15)
BUN: 16 mg/dL (ref 8–23)
CO2: 29 mmol/L (ref 22–32)
Calcium: 8.8 mg/dL — ABNORMAL LOW (ref 8.9–10.3)
Chloride: 105 mmol/L (ref 98–111)
Creatinine, Ser: 0.63 mg/dL (ref 0.44–1.00)
GFR, Estimated: >60 mL/min (ref >60)
Glucose, Bld: 115 mg/dL — ABNORMAL HIGH (ref 70–99)
Potassium: 2.7 mmol/L — CL (ref 3.5–5.1)
Sodium: 140 mmol/L (ref 135–145)
Total Bilirubin: 0.6 mg/dL (ref 0.3–1.2)
Total Protein: 6.4 g/dL — ABNORMAL LOW (ref 6.5–8.1)

## 2021-02-16 LAB — CBC
HCT: 28 % — ABNORMAL LOW (ref 36.0–46.0)
Hemoglobin: 8.5 g/dL — ABNORMAL LOW (ref 12.0–15.0)
MCH: 22.2 pg — ABNORMAL LOW (ref 26.0–34.0)
MCHC: 30.4 g/dL (ref 30.0–36.0)
MCV: 73.1 fL — ABNORMAL LOW (ref 80.0–100.0)
Platelets: 525 10*3/uL — ABNORMAL HIGH (ref 150–400)
RBC: 3.83 MIL/uL — ABNORMAL LOW (ref 3.87–5.11)
RDW: 19.9 % — ABNORMAL HIGH (ref 11.5–15.5)
WBC: 9.1 10*3/uL (ref 4.0–10.5)
nRBC: 0 % (ref 0.0–0.2)

## 2021-02-16 LAB — GLUCOSE, CAPILLARY
Glucose-Capillary: 104 mg/dL — ABNORMAL HIGH (ref 70–99)
Glucose-Capillary: 132 mg/dL — ABNORMAL HIGH (ref 70–99)
Glucose-Capillary: 146 mg/dL — ABNORMAL HIGH (ref 70–99)
Glucose-Capillary: 169 mg/dL — ABNORMAL HIGH (ref 70–99)

## 2021-02-16 LAB — URINE CULTURE
Culture: 80000 — AB
Special Requests: NORMAL

## 2021-02-16 LAB — PROTIME-INR
INR: 1.6 — ABNORMAL HIGH (ref 0.8–1.2)
Prothrombin Time: 19.4 seconds — ABNORMAL HIGH (ref 11.4–15.2)

## 2021-02-16 LAB — PROCALCITONIN: Procalcitonin: 1.47 ng/mL

## 2021-02-16 LAB — CORTISOL-AM, BLOOD: Cortisol - AM: 12.9 ug/dL (ref 6.7–22.6)

## 2021-02-16 IMAGING — DX DG HAND 2V*L*
2 series · 2 of 2 positions shown · non-contrast
Comparison: None.

CLINICAL DATA: Pain and swelling

EXAM:
LEFT HAND - 2 VIEW

[hand pa]
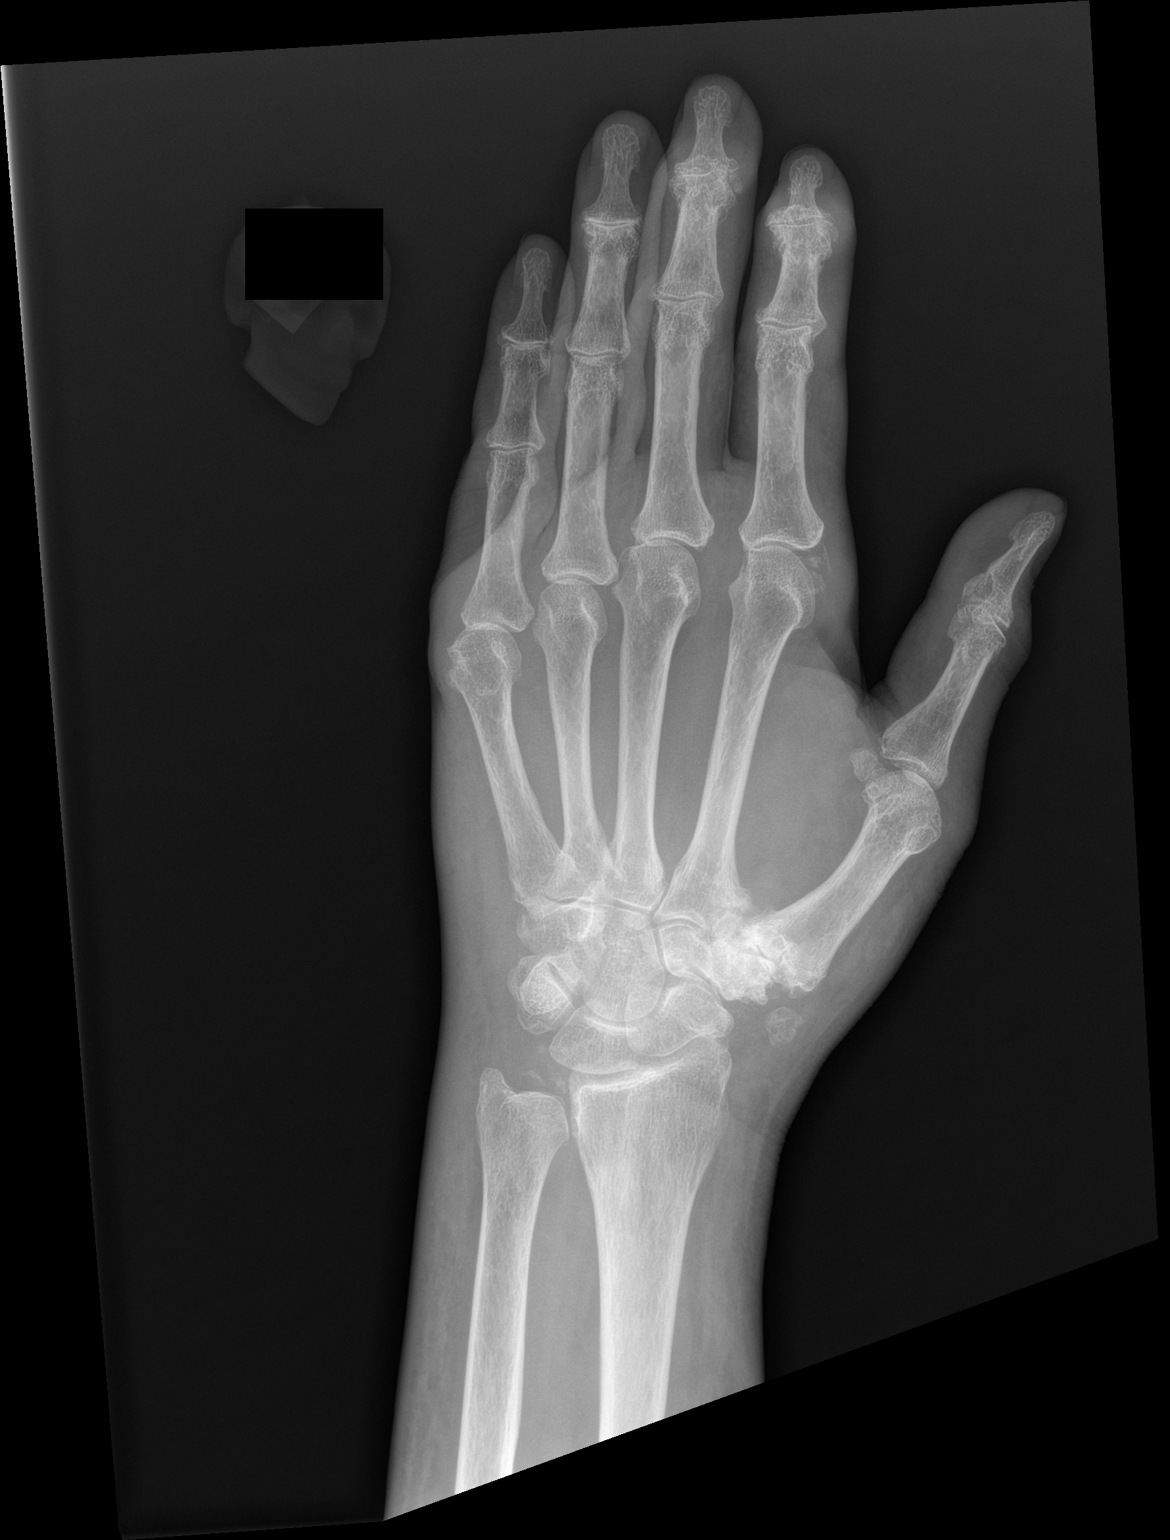

[hand lat]
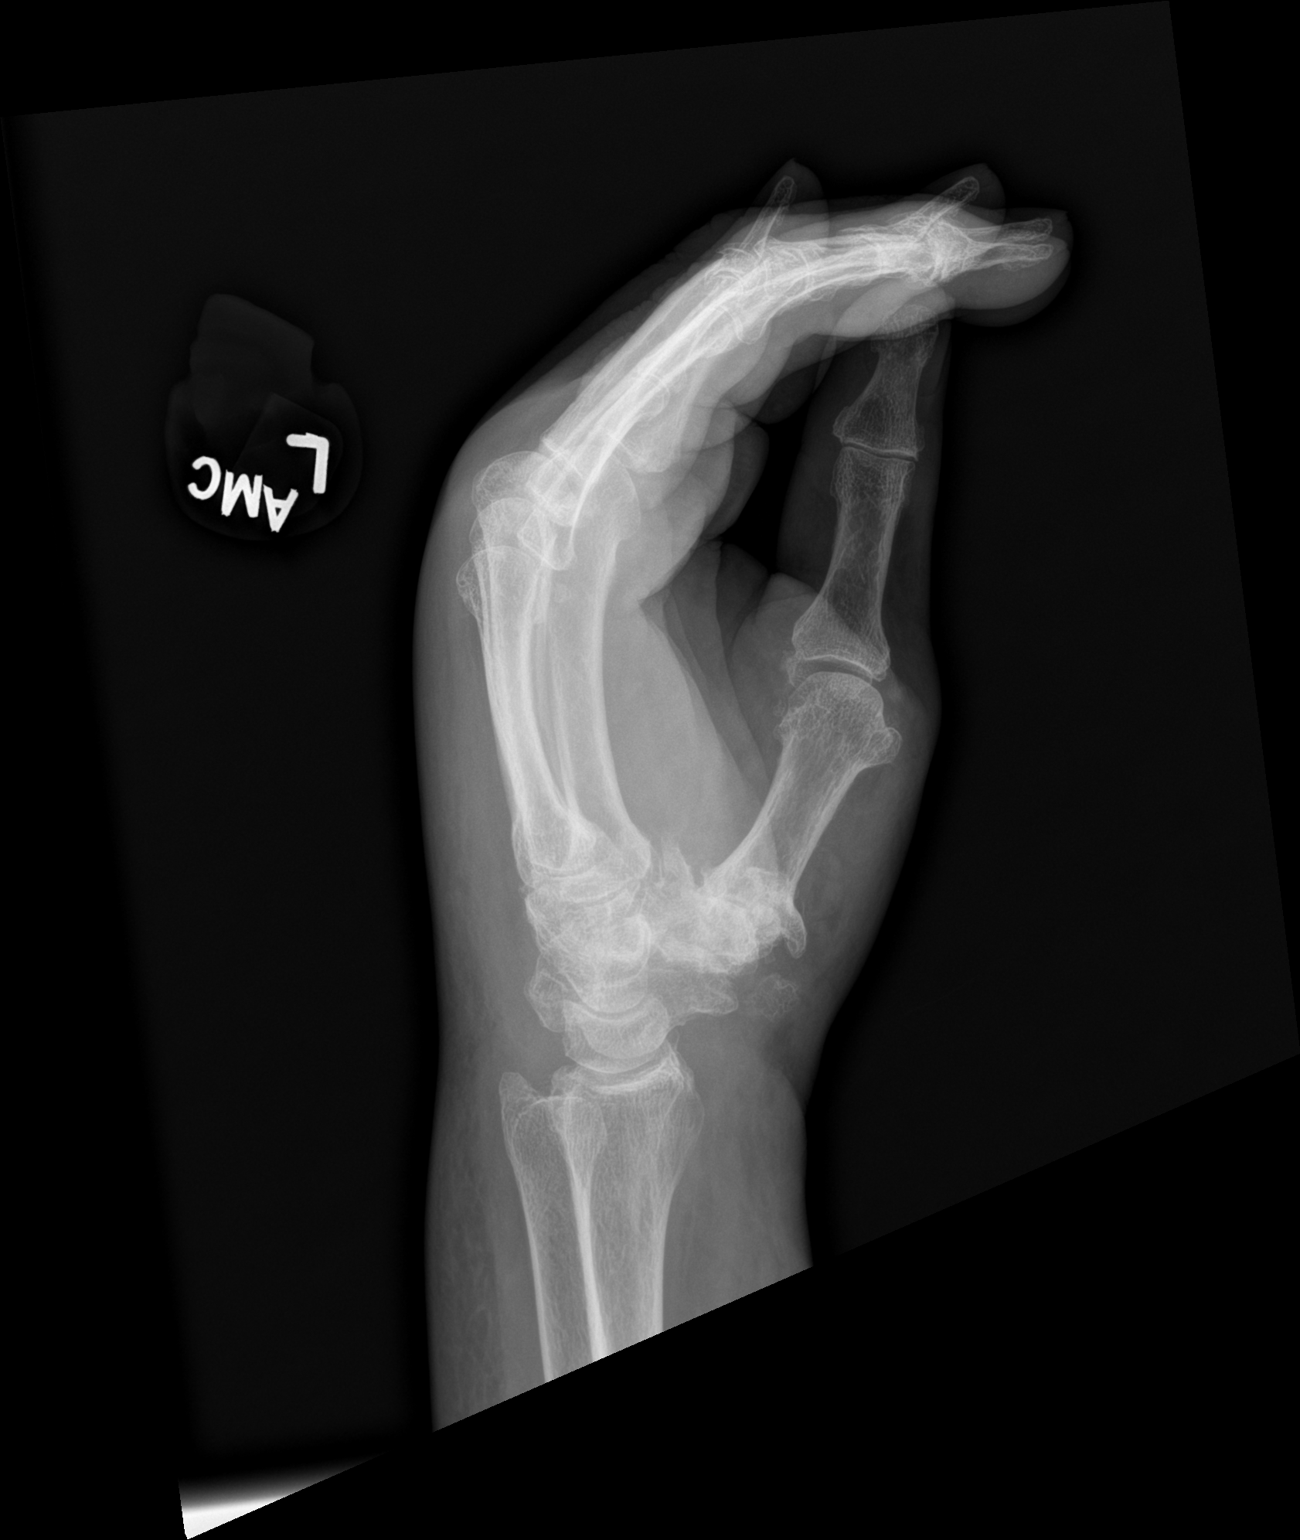

[2 of 2 positions shown; findings below may reference images not displayed]

FINDINGS: No recent fracture or dislocation is seen. There are smooth
marginated calcifications adjacent to first carpometacarpal joint
and second metacarpophalangeal joint. Severe degenerative changes
are noted in first carpometacarpal joint. Degenerative changes are
noted with bony spurs in the interphalangeal joints, more severe in
the distal interphalangeal joints of the index and middle fingers.
There are minimal bony spurs in multiple metacarpophalangeal joints.
There are amorphous calcifications in the soft tissues in the medial
aspect of the wrist, possibly chondrocalcinosis. There is soft
tissue swelling over the dorsum.
IMPRESSION: No recent fracture or dislocation is seen.There are no focal lytic
lesions. Severe osteoarthritis. Degenerative changes are noted in
multiple joints in the left hand, particularly severe in the first
carpometacarpal joint, distal interphalangeal joint of the index
finger and distal interphalangeal joint of the middle finger.

## 2021-02-16 MED ORDER — PROSOURCE PLUS PO LIQD
30.0000 mL | Freq: Two times a day (BID) | ORAL | Status: DC
  Administered 2021-02-16 – 2021-02-19 (×5): 30 mL via ORAL
  Filled 2021-02-16 (×7): qty 30

## 2021-02-16 MED ORDER — MONTELUKAST SODIUM 10 MG PO TABS
10.0000 mg | ORAL_TABLET | Freq: Every day | ORAL | Status: DC
  Administered 2021-02-16 – 2021-02-19 (×4): 10 mg via ORAL
  Filled 2021-02-16 (×5): qty 1

## 2021-02-16 MED ORDER — PANTOPRAZOLE SODIUM 40 MG PO TBEC
40.0000 mg | DELAYED_RELEASE_TABLET | Freq: Two times a day (BID) | ORAL | Status: DC
  Administered 2021-02-16 – 2021-02-20 (×8): 40 mg via ORAL
  Filled 2021-02-16 (×8): qty 1

## 2021-02-16 MED ORDER — POLYETHYLENE GLYCOL 3350 17 G PO PACK
17.0000 g | PACK | Freq: Two times a day (BID) | ORAL | Status: DC
  Administered 2021-02-16 – 2021-02-17 (×2): 17 g via ORAL
  Filled 2021-02-16 (×5): qty 1

## 2021-02-16 MED ORDER — SUCRALFATE 1 G PO TABS
1.0000 g | ORAL_TABLET | Freq: Three times a day (TID) | ORAL | Status: DC
  Administered 2021-02-16 – 2021-02-20 (×17): 1 g via ORAL
  Filled 2021-02-16 (×18): qty 1

## 2021-02-16 MED ORDER — OXYCODONE HCL 5 MG PO TABS
5.0000 mg | ORAL_TABLET | Freq: Four times a day (QID) | ORAL | Status: DC | PRN
  Administered 2021-02-16 – 2021-02-20 (×6): 5 mg via ORAL
  Filled 2021-02-16 (×6): qty 1

## 2021-02-16 MED ORDER — IBUPROFEN 200 MG PO TABS: 200.0000 mg | ORAL_TABLET | Freq: Four times a day (QID) | ORAL | Status: DC | PRN

## 2021-02-16 MED ORDER — BISACODYL 10 MG RE SUPP
10.0000 mg | Freq: Once | RECTAL | Status: AC
  Administered 2021-02-16: 10 mg via RECTAL
  Filled 2021-02-16: qty 1

## 2021-02-16 MED ORDER — SENNOSIDES-DOCUSATE SODIUM 8.6-50 MG PO TABS
1.0000 | ORAL_TABLET | Freq: Two times a day (BID) | ORAL | Status: DC
  Administered 2021-02-16 – 2021-02-17 (×3): 1 via ORAL
  Filled 2021-02-16 (×5): qty 1

## 2021-02-16 MED ORDER — KETOTIFEN FUMARATE 0.025 % OP SOLN
1.0000 [drp] | Freq: Every day | OPHTHALMIC | Status: DC
  Administered 2021-02-17 – 2021-02-20 (×4): 1 [drp] via OPHTHALMIC
  Filled 2021-02-16: qty 5

## 2021-02-16 MED ORDER — SERTRALINE HCL 25 MG PO TABS
25.0000 mg | ORAL_TABLET | Freq: Every day | ORAL | Status: DC
  Administered 2021-02-16 – 2021-02-20 (×5): 25 mg via ORAL
  Filled 2021-02-16 (×5): qty 1

## 2021-02-16 MED ORDER — POLYVINYL ALCOHOL 1.4 % OP SOLN: 1.0000 [drp] | OPHTHALMIC | Status: DC | PRN

## 2021-02-16 MED ORDER — SALINE SPRAY 0.65 % NA SOLN: 1.0000 | NASAL | Status: DC | PRN

## 2021-02-16 MED ORDER — ENSURE ENLIVE PO LIQD
237.0000 mL | Freq: Two times a day (BID) | ORAL | Status: DC
  Administered 2021-02-16 – 2021-02-19 (×6): 237 mL via ORAL

## 2021-02-16 MED ORDER — METOPROLOL TARTRATE 25 MG PO TABS
25.0000 mg | ORAL_TABLET | Freq: Two times a day (BID) | ORAL | Status: DC
  Administered 2021-02-16 – 2021-02-20 (×8): 25 mg via ORAL
  Filled 2021-02-16 (×9): qty 1

## 2021-02-16 MED ORDER — FLUTICASONE PROPIONATE 50 MCG/ACT NA SUSP
1.0000 | Freq: Every day | NASAL | Status: DC
Start: 2021-02-16 — End: 2021-02-21
  Administered 2021-02-16 – 2021-02-20 (×5): 1 via NASAL
  Filled 2021-02-16: qty 16

## 2021-02-16 MED ORDER — FLUCONAZOLE 100 MG PO TABS
100.0000 mg | ORAL_TABLET | Freq: Every day | ORAL | Status: DC
  Administered 2021-02-16 – 2021-02-20 (×5): 100 mg via ORAL
  Filled 2021-02-16 (×5): qty 1

## 2021-02-16 MED ORDER — PROPYLENE GLYCOL 0.6 % OP SOLN: Freq: Two times a day (BID) | OPHTHALMIC | Status: DC

## 2021-02-16 NOTE — Progress Notes (Signed)
Initial Nutrition Assessment  DOCUMENTATION CODES:   Obesity unspecified  INTERVENTION:   -Ensure Enlive po BID, each supplement provides 350 kcal and 20 grams of protein  -Continue vitamin regimen  -Prosource Plus PO BID, each provides 100 kcals and 15g protein  NUTRITION DIAGNOSIS:   Increased nutrient needs related to acute illness as evidenced by estimated needs.  GOAL:   Patient will meet greater than or equal to 90% of their needs  MONITOR:   Supplement acceptance, PO intake, Labs, Weight trends, I & O's  REASON FOR ASSESSMENT:   Consult Assessment of nutrition requirement/status  ASSESSMENT:   75 y.o. female with medical history significant of HTN, DVT on eliquis, wernicke's, DM2. Presenting with fevers and body aches.  Patient currently consuming 30-50% of meals. Pt takes daily thiamine given history of Wernicke's. Pt has had decreased appetite lately d/t nausea and constipation. Will order Ensure supplements with Prosource for additional kcals and protein. Pt with history of severe malnutrition, unable to diagnose at this time.  Per weight records, pt has lost 5 lbs since Nov/Dec 2021.   Per chart review, pt with BLE lymphedema, bilateral swelling of hands. Increased weakness.  Medications: Folic acid, Multivitamin with minerals daily, KLOR-CON, Senokot, Thiamine  Labs reviewed:  CBGs: 104-132 Low K  NUTRITION - FOCUSED PHYSICAL EXAM:  Unable to complete- working remote  Diet Order:   Diet Order             Diet Carb Modified Fluid consistency: Thin; Room service appropriate? Yes  Diet effective now                   EDUCATION NEEDS:   No education needs have been identified at this time  Skin:  Skin Assessment: Reviewed RN Assessment  Last BM:  11/11  Height:   Ht Readings from Last 1 Encounters:  02/15/21 5\' 11"  (1.803 m)    Weight:   Wt Readings from Last 1 Encounters:  02/15/21 110.7 kg    BMI:  Body mass index is 34.04  kg/m.  Estimated Nutritional Needs:   Kcal:  2000-2200  Protein:  100-115g  Fluid:  2L/day   Clayton Bibles, MS, RD, LDN Inpatient Clinical Dietitian Contact information available via Amion

## 2021-02-16 NOTE — Progress Notes (Signed)
PROGRESS NOTE  [name] [name] FBP:[id number] DOB: 07-18-1945 DOA: 02/15/2021 PCP: Hoyt Koch, MD  HPI/Recap of past 24 hours: [name] [name] is a 75 y.o. female with medical history significant of HTN, DVT on eliquis, wernicke's, DM2, chronic lymphedema, OA, polyarticular pseudogout. Presenting with fevers and body aches. Her symptoms started 4 days ago w/ general body aches and swelling in her hands. It made it difficult to grip things (especially with the left hand). She tried ibuprofen and APAP, but it did not help. Reported decreased appetite and some nausea. No other overt symptoms. Her facility became concerned and sent her to the ED for evaluation.  Of note, patient was last admitted 03/2020, and was diagnosed with polyarticular pseudogout of bilateral knees.  In the ED, patient was noted to be febrile 100.6 F, tachycardic.  Labs showed hypokalemia, leukocytosis.  Urine culture, blood culture were obtained.  Patient admitted for further management.      Today, patient reporting bilateral hand swelling, worse on the left, tender.  Still reporting poor appetite, has not had a bowel movement in about 3 weeks.  Feels like she has sinusitis. Denies any nausea/vomiting, abdominal pain, chest pain, cough.  No further fever so far.    Assessment/Plan: Active Problems:   Sepsis ([place])   Sepsis likely 2/2 ?fungal UTI Vs ?pseudogout flare up in b/l hands Noted R inner thigh wound On admission, noted to be febrile, tachycardic with leukocytosis Currently afebrile, with resolved leukocytosis UA unremarkable, but UC growing 80,000 yeast BC x2 NGTD CRP 29.7, ESR elevated Procalcitonin 1.47, will trend Chest x-ray unremarkable Bilateral hand x-ray showed severe OA, noted possible chondrocalcinosis around wrist Further inflammatory markers pending (ANA, ANCA) Start fluconazole for fungal UTI Continue NSAIDs for possible pseudogout flareup in BUE Continue cefepime for now, DC  vancomycin and Flagyl and monitor closely  ?Pseudogout flareup in BUE Worse in bilateral hands CRP, ESR elevated Bilateral hand x-ray showed severe OA, noted possible chondrocalcinosis around wrist Awaiting further inflammatory markers Continue NSAIDs for now, may try colchicine if no improvement  Hypokalemia Replace as needed Daily BMP  Microcytic anemia Hemoglobin currently around baseline Denies any recent bleeding Anemia panel pending Daily CBC  Thrombocytosis Likely reactive Daily CBC  Hypertension Continue Lopressor  History of DVT Continue Eliquis  GERD/Hx of multiple duodenal ulcers None EGD in 03/2020 Continue PPI twice daily Cautious use of NSAIDs  Constipation KUB showed moderate stool Strict bowel regimen  History of chronic lymphedema WOC consult  History of Warnicke's encephalopathy Continue daily thiamine  Deconditioned Has been in SNF for about a year now PT/OT  Obesity Lifestyle modification advised       Malnutrition Type:  Nutrition Problem: Increased nutrient needs Etiology: acute illness   Malnutrition Characteristics:  Signs/Symptoms: estimated needs   Nutrition Interventions:  Interventions: Ensure Enlive (each supplement provides 350kcal and 20 grams of protein), Prostat    Estimated body mass index is 34.04 kg/m as calculated from the following:   Height as of this encounter: _0  (1.803 m).   Weight as of this encounter: 110.7 kg.     Code Status: Full  Family Communication: None at bedside  Disposition Plan: Status is: Inpatient  Remains inpatient appropriate because: Level of care     Consultants: None  Procedures: None  Antimicrobials: Cefepime Fluconazole  DVT prophylaxis: Eliquis   Objective: Vitals:   02/15/21 1734 02/15/21 2125 02/16/21 0439 02/16/21 1405  BP: (!) 157/91 130/69 126/66 140/73  Pulse: 100 84 84 92  Resp: _0 Temp: 99.2 F (37.3 C) 98.7 F (37.1 C)  (!) 97.4 F (36.3 C) 99 F (37.2 C)  TempSrc: Oral Oral Oral Oral  SpO2: 98% 95% 97% 99%  Weight:      Height:        Intake/Output Summary (Last 24 hours) at 02/16/2021 1516 Last data filed at 02/16/2021 0400 Gross per 24 hour  Intake 1089.02 ml  Output 301 ml  Net 788.02 ml   Filed Weights   02/15/21 0051 02/15/21 0925  Weight: 127 kg 110.7 kg    Exam: General: NAD  Cardiovascular: S1, S2 present Respiratory: CTAB Abdomen: Soft, nontender, nondistended, bowel sounds present Musculoskeletal: Noted BLE chronic skin changes, noted BUE swelling Skin: As above Psychiatry: Normal mood     Data Reviewed: CBC: Recent Labs  Lab 02/15/21 0142 02/16/21 0543  WBC 12.3* 9.1  NEUTROABS 8.9*  --   HGB 9.0* 8.5*  HCT 28.8* 28.0*  MCV 72.7* 73.1*  PLT 510* 030*   Basic Metabolic Panel: Recent Labs  Lab 02/15/21 0142 02/15/21 0833 02/15/21 1033 02/15/21 1922 02/16/21 0543  NA 140  --  139 139 140  K 2.3*  --  2.5* 2.9* 2.7*  CL 101  --  100 103 105  CO2 29  --  _1 GLUCOSE 139*  --  123* 149* 115*  BUN 13  --  _2 CREATININE 0.78  --  0.82 0.68 0.63  CALCIUM 8.7*  --  8.8* 8.7* 8.8*  MG  --  1.9  --   --   --   PHOS  --   --  3.1 2.7  --    GFR: Estimated Creatinine Clearance: 84.5 mL/min (by C-G formula based on SCr of 0.63 mg/dL). Liver Function Tests: Recent Labs  Lab 02/15/21 0142 02/15/21 1033 02/15/21 1922 02/16/21 0543  AST 30  --   --  16  ALT 21  --   --  17  ALKPHOS 95  --   --  82  BILITOT 1.0  --   --  0.6  PROT 6.9  --   --  6.4*  ALBUMIN 2.4* 2.3* 2.5* 2.2*   No results for input(s): LIPASE, AMYLASE in the last 168 hours. No results for input(s): AMMONIA in the last 168 hours. Coagulation Profile: Recent Labs  Lab 02/15/21 0142 02/16/21 0543  INR 1.5* 1.6*   Cardiac Enzymes: No results for input(s): CKTOTAL, CKMB, CKMBINDEX, TROPONINI in the last 168 hours. BNP (last 3 results) No results for input(s): PROBNP in the  last 8760 hours. HbA1C: Recent Labs    02/15/21 0903  HGBA1C 6.0*   CBG: Recent Labs  Lab 02/15/21 1133 02/15/21 1705 02/15/21 2111 02/16/21 0745 02/16/21 1126  GLUCAP 111* 133* 116* 104* 132*   Lipid Profile: No results for input(s): CHOL, HDL, LDLCALC, TRIG, CHOLHDL, LDLDIRECT in the last 72 hours. Thyroid Function Tests: No results for input(s): TSH, T4TOTAL, FREET4, T3FREE, THYROIDAB in the last 72 hours. Anemia Panel: No results for input(s): VITAMINB12, FOLATE, FERRITIN, TIBC, IRON, RETICCTPCT in the last 72 hours. Urine analysis:    Component Value Date/Time   COLORURINE AMBER (A) 02/15/2021 0356   APPEARANCEUR HAZY (A) 02/15/2021 0356   LABSPEC 1.033 (H) 02/15/2021 0356   PHURINE 5.0 02/15/2021 0356   GLUCOSEU NEGATIVE 02/15/2021 0356   GLUCOSEU NEGATIVE 02/02/2020 1220   HGBUR MODERATE (A) 02/15/2021 0356   BILIRUBINUR SMALL (A) 02/15/2021 0356  BILIRUBINUR Neg 10/22/2017 0950   KETONESUR 20 (A) 02/15/2021 0356   PROTEINUR 100 (A) 02/15/2021 0356   UROBILINOGEN 0.2 02/02/2020 1220   NITRITE NEGATIVE 02/15/2021 0356   LEUKOCYTESUR TRACE (A) 02/15/2021 0356   Sepsis Labs: _0 (procalcitonin:4,lacticidven:4)  ) Recent Results (from the past 240 hour(s))  Resp Panel by RT-PCR (Flu A&B, Covid) Nasopharyngeal Swab     Status: None   Collection Time: 02/15/21  1:41 AM   Specimen: Nasopharyngeal Swab; Nasopharyngeal(NP) swabs in vial transport medium  Result Value Ref Range Status   SARS Coronavirus 2 by RT PCR NEGATIVE NEGATIVE Final    Comment: (NOTE) SARS-CoV-2 target nucleic acids are NOT DETECTED.  The SARS-CoV-2 RNA is generally detectable in upper respiratory specimens during the acute phase of infection. The lowest concentration of SARS-CoV-2 viral copies this assay can detect is 138 copies/mL. A negative result does not preclude SARS-Cov-2 infection and should not be used as the sole basis for treatment or other patient management decisions.  A negative result may occur with  improper specimen collection/handling, submission of specimen other than nasopharyngeal swab, presence of viral mutation(s) within the areas targeted by this assay, and inadequate number of viral copies(<138 copies/mL). A negative result must be combined with clinical observations, patient history, and epidemiological information. The expected result is Negative.  Fact Sheet for Patients:  EntrepreneurPulse.com.au  Fact Sheet for Healthcare Providers:  IncredibleEmployment.be  This test is no t yet approved or cleared by the [place] FDA and  has been authorized for detection and/or diagnosis of SARS-CoV-2 by FDA under an Emergency Use Authorization (EUA). This EUA will remain  in effect (meaning this test can be used) for the duration of the COVID-19 declaration under Section 564(b)(1) of the Act, 21 U.S.C.section 360bbb-3(b)(1), unless the authorization is terminated  or revoked sooner.       Influenza A by PCR NEGATIVE NEGATIVE Final   Influenza B by PCR NEGATIVE NEGATIVE Final    Comment: (NOTE) The Xpert Xpress SARS-CoV-2/FLU/RSV plus assay is intended as an aid in the diagnosis of influenza from Nasopharyngeal swab specimens and should not be used as a sole basis for treatment. Nasal washings and aspirates are unacceptable for Xpert Xpress SARS-CoV-2/FLU/RSV testing.  Fact Sheet for Patients: EntrepreneurPulse.com.au  Fact Sheet for Healthcare Providers: IncredibleEmployment.be  This test is not yet approved or cleared by the [place] FDA and has been authorized for detection and/or diagnosis of SARS-CoV-2 by FDA under an Emergency Use Authorization (EUA). This EUA will remain in effect (meaning this test can be used) for the duration of the COVID-19 declaration under Section 564(b)(1) of the Act, 21 U.S.C. section 360bbb-3(b)(1), unless the authorization  is terminated or revoked.  Performed at [facility], [place] [address] [place], [place] [address]   Blood Culture (routine x 2)     Status: None (Preliminary result)   Collection Time: 02/15/21  1:42 AM   Specimen: BLOOD  Result Value Ref Range Status   Specimen Description   Final    BLOOD BLOOD RIGHT HAND Performed at [facility] [address], [place], [place] [address]    Special Requests   Final    BOTTLES DRAWN AEROBIC AND ANAEROBIC Blood Culture results may not be optimal due to an excessive volume of blood received in culture bottles Performed at [facility] [address], [place], [place] [address]    Culture   Final    NO GROWTH 1 DAY Performed at  [facility], [place] [address], [place], [place] [address]    Report Status PENDING  Incomplete  Blood Culture (routine x 2)     Status: None (Preliminary result)   Collection Time: 02/15/21  1:42 AM   Specimen: BLOOD  Result Value Ref Range Status   Specimen Description   Final    BLOOD RIGHT WRIST Performed at [facility] [address] [place], [place] [address]    Special Requests   Final    BOTTLES DRAWN AEROBIC AND ANAEROBIC Blood Culture adequate volume Performed at [facility] [address], [place], [place] [address]    Culture   Final    NO GROWTH 1 DAY Performed at [facility], [place] [address], [place], [place] [address]    Report Status PENDING  Incomplete  Urine Culture     Status: Abnormal   Collection Time: 02/15/21  3:56 AM   Specimen: In/Out Cath Urine  Result Value Ref Range Status   Specimen Description   Final    IN/OUT CATH URINE Performed at [facility] [address], [place], [place] [address]    Special Requests   Final    Normal Performed at [facility], [place] [address], [place], [place] [address]    Culture 80,000 COLONIES/mL YEAST  (A)  Final   Report Status 02/16/2021 FINAL  Final      Studies: DG Hand 2 View Left  Result Date: 02/16/2021 CLINICAL DATA:  Pain and swelling EXAM: LEFT HAND - 2 VIEW COMPARISON:  None. FINDINGS: No recent fracture or dislocation is seen. There are smooth marginated calcifications adjacent to first carpometacarpal joint and second metacarpophalangeal joint. Severe degenerative changes are noted in first carpometacarpal joint. Degenerative changes are noted with bony spurs in the interphalangeal joints, more severe in the distal interphalangeal joints of the index and middle fingers. There are minimal bony spurs in multiple metacarpophalangeal joints. There are amorphous calcifications in the soft tissues in the medial aspect of the wrist, possibly chondrocalcinosis. There is soft tissue swelling over the dorsum. IMPRESSION: No recent fracture or dislocation is seen.There are no focal lytic lesions. Severe osteoarthritis. Degenerative changes are noted in multiple joints in the left hand, particularly severe in the first carpometacarpal joint, distal interphalangeal joint of the index finger and distal interphalangeal joint of the middle finger. Electronically Signed   By: Elmer Picker M.D.   On: 02/16/2021 11:19    Scheduled Meds:  (feeding supplement) PROSource Plus  30 mL Oral BID BM   apixaban  2.5 mg Oral BID   feeding supplement  237 mL Oral BID BM   folic acid  1 mg Oral Daily   insulin aspart  0-15 Units Subcutaneous TID WC   insulin aspart  0-5 Units Subcutaneous QHS   multivitamin with minerals  1 tablet Oral Daily   potassium chloride  40 mEq Oral BID   senna-docusate  1 tablet Oral QHS   thiamine  100 mg Oral Daily    Continuous Infusions:  ceFEPime (MAXIPIME) IV 2 g (02/16/21 1135)   metronidazole 500 mg (02/16/21 0300)   vancomycin 1,750 mg (02/15/21 2107)     LOS: 1 day     Alma Friendly, MD [facility]  If 7PM-7AM, please contact  night-coverage [email or web address] 02/16/2021, 3:16 PM

## 2021-02-17 LAB — CBC WITH DIFFERENTIAL/PLATELET
Abs Immature Granulocytes: 0.06 10*3/uL (ref 0.00–0.07)
Basophils Absolute: 0.1 10*3/uL (ref 0.0–0.1)
Basophils Relative: 1 %
Eosinophils Absolute: 0.2 10*3/uL (ref 0.0–0.5)
Eosinophils Relative: 2 %
HCT: 27.7 % — ABNORMAL LOW (ref 36.0–46.0)
Hemoglobin: 8.5 g/dL — ABNORMAL LOW (ref 12.0–15.0)
Immature Granulocytes: 1 %
Lymphocytes Relative: 15 %
Lymphs Abs: 1.5 10*3/uL (ref 0.7–4.0)
MCH: 22.4 pg — ABNORMAL LOW (ref 26.0–34.0)
MCHC: 30.7 g/dL (ref 30.0–36.0)
MCV: 73.1 fL — ABNORMAL LOW (ref 80.0–100.0)
Monocytes Absolute: 0.7 10*3/uL (ref 0.1–1.0)
Monocytes Relative: 7 %
Neutro Abs: 7.1 10*3/uL (ref 1.7–7.7)
Neutrophils Relative %: 74 %
Platelets: 556 10*3/uL — ABNORMAL HIGH (ref 150–400)
RBC: 3.79 MIL/uL — ABNORMAL LOW (ref 3.87–5.11)
RDW: 20.2 % — ABNORMAL HIGH (ref 11.5–15.5)
WBC: 9.5 10*3/uL (ref 4.0–10.5)
nRBC: 0 % (ref 0.0–0.2)

## 2021-02-17 LAB — BASIC METABOLIC PANEL
Anion gap: 7 (ref 5–15)
BUN: 15 mg/dL (ref 8–23)
CO2: 26 mmol/L (ref 22–32)
Calcium: 8.7 mg/dL — ABNORMAL LOW (ref 8.9–10.3)
Chloride: 108 mmol/L (ref 98–111)
Creatinine, Ser: 0.61 mg/dL (ref 0.44–1.00)
GFR, Estimated: >60 mL/min (ref >60)
Glucose, Bld: 122 mg/dL — ABNORMAL HIGH (ref 70–99)
Potassium: 3.2 mmol/L — ABNORMAL LOW (ref 3.5–5.1)
Sodium: 141 mmol/L (ref 135–145)

## 2021-02-17 LAB — GLUCOSE, CAPILLARY
Glucose-Capillary: 123 mg/dL — ABNORMAL HIGH (ref 70–99)
Glucose-Capillary: 124 mg/dL — ABNORMAL HIGH (ref 70–99)
Glucose-Capillary: 91 mg/dL (ref 70–99)
Glucose-Capillary: 90 mg/dL (ref 70–99)

## 2021-02-17 LAB — C-REACTIVE PROTEIN: CRP: 24.2 mg/dL — ABNORMAL HIGH (ref <1.0)

## 2021-02-17 LAB — FERRITIN: Ferritin: 292 ng/mL (ref 11–307)

## 2021-02-17 LAB — VITAMIN B12: Vitamin B-12: 1080 pg/mL — ABNORMAL HIGH (ref 180–914)

## 2021-02-17 LAB — C3 COMPLEMENT: C3 Complement: 178 mg/dL — ABNORMAL HIGH (ref 82–167)

## 2021-02-17 LAB — IRON AND TIBC
Iron: 10 ug/dL — ABNORMAL LOW (ref 28–170)
Saturation Ratios: 7 % — ABNORMAL LOW (ref 10.4–31.8)
TIBC: 135 ug/dL — ABNORMAL LOW (ref 250–450)
UIBC: 125 ug/dL

## 2021-02-17 LAB — RETIC PANEL
Immature Retic Fract: 28.8 % — ABNORMAL HIGH (ref 2.3–15.9)
RBC.: 3.74 MIL/uL — ABNORMAL LOW (ref 3.87–5.11)
Retic Count, Absolute: 27.3 10*3/uL (ref 19.0–186.0)
Retic Ct Pct: 0.7 % (ref 0.4–3.1)
Reticulocyte Hemoglobin: 21.3 pg — ABNORMAL LOW (ref >27.9)

## 2021-02-17 LAB — C4 COMPLEMENT: Complement C4, Body Fluid: 45 mg/dL — ABNORMAL HIGH (ref 12–38)

## 2021-02-17 LAB — MAGNESIUM: Magnesium: 2 mg/dL (ref 1.7–2.4)

## 2021-02-17 LAB — FOLATE: Folate: 23 ng/mL (ref >5.9)

## 2021-02-17 LAB — PROCALCITONIN: Procalcitonin: 0.89 ng/mL

## 2021-02-17 MED ORDER — SODIUM CHLORIDE 0.9 % IV SOLN
250.0000 mg | Freq: Every day | INTRAVENOUS | Status: AC
  Administered 2021-02-17 – 2021-02-18 (×2): 250 mg via INTRAVENOUS
  Filled 2021-02-17: qty 5
  Filled 2021-02-17: qty 20

## 2021-02-17 MED ORDER — FERROUS SULFATE 325 (65 FE) MG PO TABS
325.0000 mg | ORAL_TABLET | Freq: Every day | ORAL | Status: DC
  Administered 2021-02-19 – 2021-02-20 (×2): 325 mg via ORAL
  Filled 2021-02-17 (×2): qty 1

## 2021-02-17 MED ORDER — POTASSIUM CHLORIDE CRYS ER 20 MEQ PO TBCR
40.0000 meq | EXTENDED_RELEASE_TABLET | Freq: Two times a day (BID) | ORAL | Status: AC
  Administered 2021-02-17 (×2): 40 meq via ORAL
  Filled 2021-02-17 (×2): qty 2

## 2021-02-17 NOTE — Evaluation (Addendum)
Physical Therapy Evaluation Patient Details Name: [name] [name] MRN: [id number] DOB: Nov 26, 1945 Today's Date: 02/17/2021  History of Present Illness  [name] [name] is a 75 y.o. female with medical history significant of HTN, DVT on eliquis, wernicke's, DM2, chronic lymphedema, OA, polyarticular pseudogout. Presenting 02/15/2021 from SNF with fevers and body aches, febrile,Labs showed hypokalemia, leukocytosis.  Clinical Impression  Per patient, she has been a resident of [facility] for ` 1 year, no longer has PT. Patient reports being able to sit onto bed edge, has been nonambulatory.  Patient does present with significant joint ROM deficits and indicates pain in joints. Unfortunately, patient  is at baseline for mobility and requires a lift for safe OOB activities.   PT will provide limited treatment while in acute care to maximum benefit And sign off if no further needs.    Recommendations for follow up therapy are one component of a multi-disciplinary discharge planning process, led by the attending physician.  Recommendations may be updated based on patient status, additional functional criteria and insurance authorization.  Follow Up Recommendations Long-term institutional care without follow-up therapy    Assistance Recommended at Discharge    Functional Status Assessment Patient has not had a recent decline in their functional status  Equipment Recommendations       Recommendations for Other Services       Precautions / Restrictions Precautions Precautions: Fall Precaution Comments: significant multi joint pain Restrictions Weight Bearing Restrictions: No      Mobility  Bed Mobility Overal bed mobility: Needs Assistance Bed Mobility: Rolling;Supine to Sit;Sit to Supine Rolling: +2 for physical assistance;+2 for safety/equipment;Total assist   Supine to sit: +2 for physical assistance;+2 for safety/equipment;HOB elevated;Total assist Sit to supine: Total assist    General bed mobility comments: patient has difficulty reaching to rails to roll due to lack of ROM/pain. patient required max/total assistance to assume sitting on bed edge and to return to supine. patient quite limited in assisting    Transfers                   General transfer comment: will require a mechanical lift/sling type.    Ambulation/Gait                  Stairs            Wheelchair Mobility    Modified Rankin (Stroke Patients Only)       Balance Overall balance assessment: Needs assistance Sitting-balance support: Feet supported;Bilateral upper extremity supported Sitting balance-Leahy Scale: Fair Sitting balance - Comments: sits at midline                                     Pertinent Vitals/Pain Pain Assessment: Faces Faces Pain Scale: Hurts whole lot Pain Location: left shoulder especially,  just about all over when moved and rolled. Pain Descriptors / Indicators: Discomfort;Moaning Pain Intervention(s): Limited activity within patient's tolerance;Monitored during session;Patient requesting pain meds-RN notified    Home Living Family/patient expects to be discharged to:: Skilled nursing facility                   Additional Comments: Pt poor historian. from [facility]    Prior Function Prior Level of Function : Patient poor historian/Family not available             Mobility Comments: patiwent reports PT no longer has PT in facility, Patient reports that she  sits herself up on  side of bed. ADLs Comments: has assistance at facility     Hand Dominance   Dominant Hand: Right    Extremity/Trunk Assessment        Lower Extremity Assessment Lower Extremity Assessment: RLE deficits/detail;LLE deficits/detail RLE Deficits / Details: lifts from bed  a few inches, decreased abd ROM, similar to left for strength and ROM RLE: Unable to fully assess due to pain LLE Deficits / Details: knees and hps  flex when seated, knee ext 2/5 at best    Cervical / Trunk Assessment Cervical / Trunk Assessment: Other exceptions Cervical / Trunk Exceptions: some what scoliotic  Communication   Communication: No difficulties  Cognition Arousal/Alertness: Awake/alert Behavior During Therapy: WFL for tasks assessed/performed Overall Cognitive Status: No family/caregiver present to determine baseline cognitive functioning                                 General Comments: somewhat labile , follows 1 step directions for mobility. Patient does  have difficulty expaining  what she does at the facility        General Comments      Exercises     Assessment/Plan    PT Assessment Provide PT on limited basis   PT Problem List Decreased strength;Decreased mobility;Decreased safety awareness;Decreased activity tolerance;Decreased knowledge of precautions;Decreased knowledge of use of DME;Decreased cognition;Pain;Decreased skin integrity       PT Treatment Interventions   Functional mobility training   PT Goals (Current goals can be found in the Care Plan section)  Acute Rehab PT Goals Patient Stated Goal: I want  to go home PT Goal Formulation: with patient Goals 03/03/2021   Frequency  1 x   Barriers to discharge        Co-evaluation  Yes, for therapist safety             AM-PAC PT "6 Clicks" Mobility  Outcome Measure Help needed turning from your back to your side while in a flat bed without using bedrails?: Total Help needed moving from lying on your back to sitting on the side of a flat bed without using bedrails?: Total Help needed moving to and from a bed to a chair (including a wheelchair)?: Total Help needed standing up from a chair using your arms (e.g., wheelchair or bedside chair)?: Total Help needed to walk in hospital room?: Total Help needed climbing 3-5 steps with a railing? : Total 6 Click Score: 6    End of Session   Activity Tolerance: Patient  limited by pain Patient left: in bed;with call bell/phone within reach;with bed alarm set Nurse Communication: Mobility status;Need for lift equipment PT Visit Diagnosis: Muscle weakness (generalized) (M62.81);Pain Pain - Right/Left: Left Pain - part of body: Shoulder    Time: 1694-5038 PT Time Calculation (min) (ACUTE ONLY): 35 min   Charges:   PT Evaluation $PT Eval Low Complexity: [facility] Acute Rehabilitation Services Pager [phone] Office [phone]   Claretha Cooper 02/17/2021, 2:22 PM

## 2021-02-17 NOTE — Evaluation (Signed)
Occupational Therapy Evaluation Patient Details Name: [name] [name] MRN: [id number] DOB: 1945/08/31 Today's Date: 02/17/2021   History of Present Illness Candice Alarid is a 75 y.o. female with medical history significant of HTN, DVT on eliquis, wernicke's, DM2, chronic lymphedema, OA, polyarticular pseudogout. Presenting 02/15/2021 from SNF with fevers and body aches, febrile,Labs showed hypokalemia, leukocytosis.   Clinical Impression   Patient evaluated by Occupational Therapy with no further acute OT needs identified. All education has been completed and the patient has no further questions. Patient is at baseline for ADLs at this time. Patient to transition back to SNF for continued long term care at time of d/c. All needs can be met in next level of care.  See below for any follow-up Occupational Therapy or equipment needs. OT is signing off. Thank you for this referral.       Recommendations for follow up therapy are one component of a multi-disciplinary discharge planning process, led by the attending physician.  Recommendations may be updated based on patient status, additional functional criteria and insurance authorization.   Follow Up Recommendations  Skilled nursing-short term rehab (<3 hours/day)    Assistance Recommended at Discharge Frequent or constant Supervision/Assistance  Functional Status Assessment  Patient has had a recent decline in their functional status and/or demonstrates limited ability to make significant improvements in function in a reasonable and predictable amount of time  Equipment Recommendations  None recommended by OT (patient to return to SNF for long term care)    Recommendations for Other Services       Precautions / Restrictions Precautions Precautions: Fall Precaution Comments: significant multi joint pain Restrictions Weight Bearing Restrictions: No      Mobility Bed Mobility Overal bed mobility: Needs Assistance Bed Mobility:  Rolling;Supine to Sit;Sit to Supine Rolling: +2 for physical assistance;+2 for safety/equipment;Total assist   Supine to sit: +2 for physical assistance;+2 for safety/equipment;HOB elevated;Total assist Sit to supine: Total assist   General bed mobility comments: patient has difficulty reaching to rails to roll due to lack of ROM/pain. patient required max/total assistance to assume sitting on bed edge and to return to supine. patient quite limited in assisting    Transfers                   General transfer comment: will require a mechanical lift/sling type.      Balance Overall balance assessment: Needs assistance Sitting-balance support: Feet supported;Bilateral upper extremity supported Sitting balance-Leahy Scale: Fair Sitting balance - Comments: sits at midline                                   ADL either performed or assessed with clinical judgement   ADL Overall ADL's : Needs assistance/impaired Eating/Feeding: Set up;Sitting Eating/Feeding Details (indicate cue type and reason): patient was provided with red foam for utensils with nursing educated on need to assist patient with set up. Grooming: Wash/dry face;Oral care;Sitting;Set up Grooming Details (indicate cue type and reason): EOB with increased time Upper Body Bathing: Minimal assistance;Bed level   Lower Body Bathing: Maximal assistance;Bed level Lower Body Bathing Details (indicate cue type and reason): patient reported being able to complete sitting EOB prior level but was noted to make other comments during session that would indicate need for more assistance at SNF level Upper Body Dressing : Minimal assistance;Bed level   Lower Body Dressing: Bed level;Maximal assistance   Toilet Transfer: +2 for safety/equipment;+2 for  physical assistance;Total assistance Toilet Transfer Details (indicate cue type and reason): patient reported using adult absorbent undergarments at SNF Toileting-  Clothing Manipulation and Hygiene: +2 for safety/equipment;+2 for physical assistance;Total assistance;Bed level               Vision Patient Visual Report: No change from baseline       Perception     Praxis      Pertinent Vitals/Pain Pain Assessment: Faces Faces Pain Scale: Hurts whole lot Pain Location: left shoulder especially,  just about all over when moved and rolled. Pain Descriptors / Indicators: Discomfort;Moaning Pain Intervention(s): Limited activity within patient's tolerance;Monitored during session;Patient requesting pain meds-RN notified     Hand Dominance Right   Extremity/Trunk Assessment Upper Extremity Assessment Upper Extremity Assessment: RUE deficits/detail;LUE deficits/detail RUE Deficits / Details: patient was able to range RUE to about 50 degrees of flexion with AAROM with noted grimacing with movements. all digit joints are swolen at this time with pain with movement. RUE: Unable to fully assess due to pain LUE Deficits / Details: patient was able to range UE about 30 degrees of flexion with AAROM with notable pain with movement. LUE: Unable to fully assess due to pain   Lower Extremity Assessment Lower Extremity Assessment: Defer to PT evaluation RLE Deficits / Details: lifts from bed  a few inches, decreased abd ROM, similar to left for strength and ROM RLE: Unable to fully assess due to pain LLE Deficits / Details: knees and hps flex when seated, knee ext 2/5 at best   Cervical / Trunk Assessment Cervical / Trunk Assessment: Other exceptions Cervical / Trunk Exceptions: some what scoliotic   Communication Communication Communication: No difficulties   Cognition Arousal/Alertness: Awake/alert Behavior During Therapy: WFL for tasks assessed/performed Overall Cognitive Status: No family/caregiver present to determine baseline cognitive functioning                                 General Comments: somewhat labile , follows 1  step directions for mobility. Patient does  have difficulty expaining  what she does at the facility. patient reported she was good to go home after the hosptial. does not seem to have awareness of deficits.     General Comments       Exercises     Shoulder Instructions      Home Living Family/patient expects to be discharged to:: Skilled nursing facility                                 Additional Comments: Pt poor historian. from [facility]      Prior Functioning/Environment Prior Level of Function : Patient poor historian/Family not available             Mobility Comments: patient reported she no longer gets therapy at SNF. ADLs Comments: patient reported she was able to sit up on edge of bed herself and preform bathing tasks.        OT Problem List: Decreased strength;Decreased range of motion;Impaired balance (sitting and/or standing);Decreased activity tolerance;Decreased coordination;Decreased safety awareness;Decreased knowledge of precautions;Decreased knowledge of use of DME or AE;Pain;Impaired UE functional use      OT Treatment/Interventions:      OT Goals(Current goals can be found in the care plan section) Acute Rehab OT Goals OT Goal Formulation: All assessment and education complete, DC therapy  OT Frequency:  Barriers to D/C:            Co-evaluation PT/OT/SLP Co-Evaluation/Treatment: Yes Reason for Co-Treatment: To address functional/ADL transfers;For patient/therapist safety PT goals addressed during session: Mobility/safety with mobility OT goals addressed during session: ADL's and self-care      AM-PAC OT "6 Clicks" Daily Activity     Outcome Measure Help from another person eating meals?: A Little Help from another person taking care of personal grooming?: A Little Help from another person toileting, which includes using toliet, bedpan, or urinal?: Total Help from another person bathing (including washing, rinsing,  drying)?: Total Help from another person to put on and taking off regular upper body clothing?: A Lot Help from another person to put on and taking off regular lower body clothing?: Total 6 Click Score: 11   End of Session Nurse Communication: Mobility status;Patient requests pain meds  Activity Tolerance: Patient tolerated treatment well;Patient limited by pain Patient left: in bed;with call bell/phone within reach;with bed alarm set  OT Visit Diagnosis: Unsteadiness on feet (R26.81);Other abnormalities of gait and mobility (R26.89);Muscle weakness (generalized) (M62.81);Pain                Time: 7573-2256 OT Time Calculation (min): 39 min Charges:  OT General Charges $OT Visit: 1 Visit OT Evaluation $OT Eval Low Complexity: 1 Low OT Treatments $Self Care/Home Management : 8-22 mins  Jackelyn Poling OTR/L, MS Acute Rehabilitation Department Office# [phone] Pager# [phone]   [place] 02/17/2021, 3:54 PM

## 2021-02-17 NOTE — Progress Notes (Addendum)
PROGRESS NOTE  [name] [name] RXY:[id number] DOB: March 10, 1946 DOA: 02/15/2021 PCP: Hoyt Koch, MD  HPI/Recap of past 24 hours: [name] [name] is a 75 y.o. female with medical history significant of HTN, DVT on eliquis, wernicke's, DM2, chronic lymphedema, OA, polyarticular pseudogout. Presenting with fevers and body aches. Her symptoms started 4 days ago w/ general body aches and swelling in her hands. It made it difficult to grip things (especially with the left hand). She tried ibuprofen and APAP, but it did not help. Reported decreased appetite and some nausea. No other overt symptoms. Her facility became concerned and sent her to the ED for evaluation.  Of note, patient was last admitted 03/2020, and was diagnosed with polyarticular pseudogout of bilateral knees.  In the ED, patient was noted to be febrile 100.6 F, tachycardic.  Labs showed hypokalemia, leukocytosis.  Urine culture, blood culture were obtained.  Patient admitted for further management.     Today, patient denies any new complaints, still with pain especially on her left hand/wrist.  Had a BM today.  Met patient trying to eat breakfast.    Assessment/Plan: Active Problems:   Sepsis ([place])   Sepsis likely 2/2 ?fungal UTI Vs ?pseudogout flare up in b/l hands Noted R inner thigh wound On admission, noted to be febrile, tachycardic with leukocytosis Currently afebrile, with resolved leukocytosis UA unremarkable, but UC growing 80,000 yeast BC x2 NGTD CRP 29.7, ESR elevated Procalcitonin 1.47-->0.89 Chest x-ray unremarkable Bilateral hand x-ray showed severe OA, noted possible chondrocalcinosis around wrist Further inflammatory markers pending (ANA, ANCA) Continue fluconazole for fungal UTI Continue NSAIDs for possible pseudogout flareup in BUE Continue cefepime for now, DC vancomycin and Flagyl and monitor closely  ?Pseudogout flareup in BUE Worse in bilateral hands CRP, ESR elevated Bilateral hand  x-ray showed severe OA, noted possible chondrocalcinosis around wrist Awaiting further inflammatory markers Continue NSAIDs for now, may need a short course of steroids/colchicine  Hypokalemia Replace as needed Daily BMP  Iron def anemia Hemoglobin currently around baseline Denies any recent bleeding Anemia panel showed iron 10, sat 7, ferritin 292, folate 23, vitamin B12 >1000 Start IV iron X 2 days, then PO iron Retic panel done Daily CBC  Thrombocytosis Likely reactive Daily CBC  Hypertension Continue Lopressor  History of DVT Continue Eliquis  GERD/Hx of multiple duodenal ulcers None EGD in 03/2020 Continue PPI twice daily Cautious use of NSAIDs  Constipation KUB showed moderate stool Strict bowel regimen  History of chronic lymphedema WOC consult  History of Warnicke's encephalopathy Continue daily thiamine  Deconditioned Has been in SNF for about a year now PT/OT  Obesity Lifestyle modification advised       Malnutrition Type:  Nutrition Problem: Increased nutrient needs Etiology: acute illness   Malnutrition Characteristics:  Signs/Symptoms: estimated needs   Nutrition Interventions:  Interventions: Ensure Enlive (each supplement provides 350kcal and 20 grams of protein), Prostat    Estimated body mass index is 34.04 kg/m as calculated from the following:   Height as of this encounter: _0  (1.803 m).   Weight as of this encounter: 110.7 kg.     Code Status: Full  Family Communication: None at bedside  Disposition Plan: Status is: Inpatient  Remains inpatient appropriate because: Level of care     Consultants: None  Procedures: None  Antimicrobials: Cefepime Fluconazole  DVT prophylaxis: Eliquis   Objective: Vitals:   02/16/21 1405 02/16/21 1948 02/17/21 0331 02/17/21 1216  BP: 140/73 132/67 137/82 (!) 141/76  Pulse: 92 83 88  92  Resp: _0 Temp: 99 F (37.2 C) 97.9 F (36.6 C) 98.1 F (36.7  C) 98.3 F (36.8 C)  TempSrc: Oral Oral Oral Oral  SpO2: 99% 96% 96% 99%  Weight:      Height:        Intake/Output Summary (Last 24 hours) at 02/17/2021 1423 Last data filed at 02/17/2021 0100 Gross per 24 hour  Intake --  Output 600 ml  Net -600 ml   Filed Weights   02/15/21 0051 02/15/21 0925  Weight: 127 kg 110.7 kg    Exam: General: NAD  Cardiovascular: S1, S2 present Respiratory: CTAB Abdomen: Soft, nontender, nondistended, bowel sounds present Musculoskeletal: Noted BLE chronic skin changes, noted BUE swelling Skin: As above Psychiatry: Normal mood     Data Reviewed: CBC: Recent Labs  Lab 02/15/21 0142 02/16/21 0543 02/17/21 0505  WBC 12.3* 9.1 9.5  NEUTROABS 8.9*  --  7.1  HGB 9.0* 8.5* 8.5*  HCT 28.8* 28.0* 27.7*  MCV 72.7* 73.1* 73.1*  PLT 510* 525* 413*   Basic Metabolic Panel: Recent Labs  Lab 02/15/21 0142 02/15/21 0833 02/15/21 1033 02/15/21 1922 02/16/21 0543 02/17/21 0505  NA 140  --  139 139 140 141  K 2.3*  --  2.5* 2.9* 2.7* 3.2*  CL 101  --  100 103 105 108  CO2 29  --  _1 GLUCOSE 139*  --  123* 149* 115* 122*  BUN 13  --  _2 CREATININE 0.78  --  0.82 0.68 0.63 0.61  CALCIUM 8.7*  --  8.8* 8.7* 8.8* 8.7*  MG  --  1.9  --   --   --  2.0  PHOS  --   --  3.1 2.7  --   --    GFR: Estimated Creatinine Clearance: 84.5 mL/min (by C-G formula based on SCr of 0.61 mg/dL). Liver Function Tests: Recent Labs  Lab 02/15/21 0142 02/15/21 1033 02/15/21 1922 02/16/21 0543  AST 30  --   --  16  ALT 21  --   --  17  ALKPHOS 95  --   --  82  BILITOT 1.0  --   --  0.6  PROT 6.9  --   --  6.4*  ALBUMIN 2.4* 2.3* 2.5* 2.2*   No results for input(s): LIPASE, AMYLASE in the last 168 hours. No results for input(s): AMMONIA in the last 168 hours. Coagulation Profile: Recent Labs  Lab 02/15/21 0142 02/16/21 0543  INR 1.5* 1.6*   Cardiac Enzymes: No results for input(s): CKTOTAL, CKMB, CKMBINDEX, TROPONINI in the  last 168 hours. BNP (last 3 results) No results for input(s): PROBNP in the last 8760 hours. HbA1C: Recent Labs    02/15/21 0903  HGBA1C 6.0*   CBG: Recent Labs  Lab 02/16/21 1126 02/16/21 1718 02/16/21 2140 02/17/21 0738 02/17/21 1208  GLUCAP 132* 146* 169* 123* 124*   Lipid Profile: No results for input(s): CHOL, HDL, LDLCALC, TRIG, CHOLHDL, LDLDIRECT in the last 72 hours. Thyroid Function Tests: No results for input(s): TSH, T4TOTAL, FREET4, T3FREE, THYROIDAB in the last 72 hours. Anemia Panel: Recent Labs    02/17/21 0505  VITAMINB12 1,080*  FOLATE 23.0  FERRITIN 292  TIBC 135*  IRON 10*  RETICCTPCT 0.7   Urine analysis:    Component Value Date/Time   COLORURINE AMBER (A) 02/15/2021 0356   APPEARANCEUR HAZY (A) 02/15/2021 0356   LABSPEC 1.033 (H) 02/15/2021  [place] Level 5.0 02/15/2021 0356   GLUCOSEU NEGATIVE 02/15/2021 0356   GLUCOSEU NEGATIVE 02/02/2020 1220   HGBUR MODERATE (A) 02/15/2021 0356   BILIRUBINUR SMALL (A) 02/15/2021 0356   BILIRUBINUR Neg 10/22/2017 0950   KETONESUR 20 (A) 02/15/2021 0356   PROTEINUR 100 (A) 02/15/2021 0356   UROBILINOGEN 0.2 02/02/2020 1220   NITRITE NEGATIVE 02/15/2021 0356   LEUKOCYTESUR TRACE (A) 02/15/2021 0356   Sepsis Labs: _0 (procalcitonin:4,lacticidven:4)  ) Recent Results (from the past 240 hour(s))  Resp Panel by RT-PCR (Flu A&B, Covid) Nasopharyngeal Swab     Status: None   Collection Time: 02/15/21  1:41 AM   Specimen: Nasopharyngeal Swab; Nasopharyngeal(NP) swabs in vial transport medium  Result Value Ref Range Status   SARS Coronavirus 2 by RT PCR NEGATIVE NEGATIVE Final    Comment: (NOTE) SARS-CoV-2 target nucleic acids are NOT DETECTED.  The SARS-CoV-2 RNA is generally detectable in upper respiratory specimens during the acute phase of infection. The lowest concentration of SARS-CoV-2 viral copies this assay can detect is 138 copies/mL. A negative result does not preclude  SARS-Cov-2 infection and should not be used as the sole basis for treatment or other patient management decisions. A negative result may occur with  improper specimen collection/handling, submission of specimen other than nasopharyngeal swab, presence of viral mutation(s) within the areas targeted by this assay, and inadequate number of viral copies(<138 copies/mL). A negative result must be combined with clinical observations, patient history, and epidemiological information. The expected result is Negative.  Fact Sheet for Patients:  EntrepreneurPulse.com.au  Fact Sheet for Healthcare Providers:  IncredibleEmployment.be  This test is no t yet approved or cleared by the [place] FDA and  has been authorized for detection and/or diagnosis of SARS-CoV-2 by FDA under an Emergency Use Authorization (EUA). This EUA will remain  in effect (meaning this test can be used) for the duration of the COVID-19 declaration under Section 564(b)(1) of the Act, 21 U.S.C.section 360bbb-3(b)(1), unless the authorization is terminated  or revoked sooner.       Influenza A by PCR NEGATIVE NEGATIVE Final   Influenza B by PCR NEGATIVE NEGATIVE Final    Comment: (NOTE) The Xpert Xpress SARS-CoV-2/FLU/RSV plus assay is intended as an aid in the diagnosis of influenza from Nasopharyngeal swab specimens and should not be used as a sole basis for treatment. Nasal washings and aspirates are unacceptable for Xpert Xpress SARS-CoV-2/FLU/RSV testing.  Fact Sheet for Patients: EntrepreneurPulse.com.au  Fact Sheet for Healthcare Providers: IncredibleEmployment.be  This test is not yet approved or cleared by the [place] FDA and has been authorized for detection and/or diagnosis of SARS-CoV-2 by FDA under an Emergency Use Authorization (EUA). This EUA will remain in effect (meaning this test can be used) for the duration of  the COVID-19 declaration under Section 564(b)(1) of the Act, 21 U.S.C. section 360bbb-3(b)(1), unless the authorization is terminated or revoked.  Performed at [facility], [place] [address] [place], [place] [address]   Blood Culture (routine x 2)     Status: None (Preliminary result)   Collection Time: 02/15/21  1:42 AM   Specimen: BLOOD  Result Value Ref Range Status   Specimen Description   Final    BLOOD BLOOD RIGHT HAND Performed at [facility] [address] [place], [place] [address]    Special Requests   Final    BOTTLES DRAWN AEROBIC AND ANAEROBIC Blood Culture results may not be optimal due to an excessive volume  of blood received in culture bottles Performed at [facility], [place] [address] [place], [place] [address]    Culture   Final    NO GROWTH 2 DAYS Performed at [facility] [address] [place], [place] [address]    Report Status PENDING  Incomplete  Blood Culture (routine x 2)     Status: None (Preliminary result)   Collection Time: 02/15/21  1:42 AM   Specimen: BLOOD  Result Value Ref Range Status   Specimen Description   Final    BLOOD RIGHT WRIST Performed at [facility] [address], [place], [place] [address]    Special Requests   Final    BOTTLES DRAWN AEROBIC AND ANAEROBIC Blood Culture adequate volume Performed at [facility] [address], [place], [place] [address]    Culture   Final    NO GROWTH 2 DAYS Performed at [facility] [address], [place], [place] [address]    Report Status PENDING  Incomplete  Urine Culture     Status: Abnormal   Collection Time: 02/15/21  3:56 AM   Specimen: In/Out Cath Urine  Result Value Ref Range Status   Specimen Description   Final    IN/OUT CATH URINE Performed at [facility] [address], [place], [place] [address]    Special Requests   Final     Normal Performed at [facility], [place] [address] [place],  [address]    Culture 80,000 COLONIES/mL YEAST (A)  Final   Report Status 02/16/2021 FINAL  Final      Studies: No results found.  Scheduled Meds:  (feeding supplement) PROSource Plus  30 mL Oral BID BM   apixaban  2.5 mg Oral BID   feeding supplement  237 mL Oral BID BM   [START ON 02/19/2021] ferrous sulfate  325 mg Oral Q breakfast   fluconazole  100 mg Oral Daily   fluticasone  1 spray Each Nare Daily   folic acid  1 mg Oral Daily   insulin aspart  0-15 Units Subcutaneous TID WC   insulin aspart  0-5 Units Subcutaneous QHS   ketotifen  1 drop Both Eyes Daily   metoprolol tartrate  25 mg Oral BID   montelukast  10 mg Oral QHS   multivitamin with minerals  1 tablet Oral Daily   pantoprazole  40 mg Oral BID   polyethylene glycol  17 g Oral BID   potassium chloride  40 mEq Oral BID   senna-docusate  1 tablet Oral BID   sertraline  25 mg Oral Daily   sucralfate  1 g Oral TID AC & HS   thiamine  100 mg Oral Daily    Continuous Infusions:  ceFEPime (MAXIPIME) IV 2 g (02/17/21 1134)   ferric gluconate (FERRLECIT) IVPB 250 mg (02/17/21 1221)     LOS: 2 days     Alma Friendly, MD [facility]  If 7PM-7AM, please contact night-coverage [email or web address] 02/17/2021, 2:23 PM

## 2021-02-17 NOTE — Plan of Care (Signed)

## 2021-02-17 NOTE — Consult Note (Addendum)
[facility] Nurse Consult Note: Reason for Consult: Consult requested for right inner upper thigh. Pt has patchy areas of pink dry partial thickness wounds; .5X.5X.1cm and .8X.8X.1cm with loose peeling skin surrounding and dry black cab 1X1cm.  No odor, drainage, or fluctuance.  Entire affected area is approx 4X4cm.  Pt states she developed thse as shear injuries prior to admission from sliding on and off the side of the bed and they have greatly improved since previous condition.  Dressing procedure/placement/frequency: Topical treatment orders provided for bedside nurses to perform as follows to protect and promote healing: Foam dressing to right thigh; change Q 3 days or PRN soiling.) Please re-consult if further assistance is needed.  Thank-you,  Julien Girt MSN, [place], [place], [place], [place]

## 2021-02-18 DIAGNOSIS — E43 Unspecified severe protein-calorie malnutrition: Secondary | ICD-10-CM | POA: Insufficient documentation

## 2021-02-18 LAB — CBC WITH DIFFERENTIAL/PLATELET
Abs Immature Granulocytes: 0.08 10*3/uL — ABNORMAL HIGH (ref 0.00–0.07)
Basophils Absolute: 0.1 10*3/uL (ref 0.0–0.1)
Basophils Relative: 1 %
Eosinophils Absolute: 0.3 10*3/uL (ref 0.0–0.5)
Eosinophils Relative: 4 %
HCT: 27.1 % — ABNORMAL LOW (ref 36.0–46.0)
Hemoglobin: 8 g/dL — ABNORMAL LOW (ref 12.0–15.0)
Immature Granulocytes: 1 %
Lymphocytes Relative: 18 %
Lymphs Abs: 1.5 10*3/uL (ref 0.7–4.0)
MCH: 22.2 pg — ABNORMAL LOW (ref 26.0–34.0)
MCHC: 29.5 g/dL — ABNORMAL LOW (ref 30.0–36.0)
MCV: 75.1 fL — ABNORMAL LOW (ref 80.0–100.0)
Monocytes Absolute: 0.6 10*3/uL (ref 0.1–1.0)
Monocytes Relative: 8 %
Neutro Abs: 5.6 10*3/uL (ref 1.7–7.7)
Neutrophils Relative %: 68 %
Platelets: 568 10*3/uL — ABNORMAL HIGH (ref 150–400)
RBC: 3.61 MIL/uL — ABNORMAL LOW (ref 3.87–5.11)
RDW: 20.5 % — ABNORMAL HIGH (ref 11.5–15.5)
WBC: 8.2 10*3/uL (ref 4.0–10.5)
nRBC: 0 % (ref 0.0–0.2)

## 2021-02-18 LAB — BASIC METABOLIC PANEL
Anion gap: 8 (ref 5–15)
BUN: 14 mg/dL (ref 8–23)
CO2: 26 mmol/L (ref 22–32)
Calcium: 8.9 mg/dL (ref 8.9–10.3)
Chloride: 108 mmol/L (ref 98–111)
Creatinine, Ser: 0.69 mg/dL (ref 0.44–1.00)
GFR, Estimated: >60 mL/min (ref >60)
Glucose, Bld: 101 mg/dL — ABNORMAL HIGH (ref 70–99)
Potassium: 3.8 mmol/L (ref 3.5–5.1)
Sodium: 142 mmol/L (ref 135–145)

## 2021-02-18 LAB — ANCA PROFILE
Anti-MPO Antibodies: <0.2 units (ref 0.0–0.9)
Anti-PR3 Antibodies: <0.2 units (ref 0.0–0.9)
Atypical P-ANCA titer: <1:20 {titer}
C-ANCA: <1:20 {titer}
P-ANCA: <1:20 {titer}

## 2021-02-18 LAB — ANA W/REFLEX IF POSITIVE: Anti Nuclear Antibody (ANA): NEGATIVE

## 2021-02-18 LAB — GLUCOSE, CAPILLARY
Glucose-Capillary: 96 mg/dL (ref 70–99)
Glucose-Capillary: 98 mg/dL (ref 70–99)
Glucose-Capillary: 162 mg/dL — ABNORMAL HIGH (ref 70–99)
Glucose-Capillary: 127 mg/dL — ABNORMAL HIGH (ref 70–99)

## 2021-02-18 LAB — C-REACTIVE PROTEIN: CRP: 20 mg/dL — ABNORMAL HIGH (ref <1.0)

## 2021-02-18 LAB — PROCALCITONIN: Procalcitonin: 0.6 ng/mL

## 2021-02-18 MED ORDER — LIP MEDEX EX OINT
TOPICAL_OINTMENT | CUTANEOUS | Status: DC | PRN
  Filled 2021-02-18: qty 7

## 2021-02-18 MED ORDER — PREDNISONE 20 MG PO TABS
40.0000 mg | ORAL_TABLET | Freq: Every day | ORAL | Status: DC
  Administered 2021-02-18 – 2021-02-20 (×3): 40 mg via ORAL
  Filled 2021-02-18 (×2): qty 2

## 2021-02-18 NOTE — NC FL2 (Signed)
[place] MEDICAID FL2 LEVEL OF CARE SCREENING TOOL     IDENTIFICATION  Patient Name: [name] [name] Birthdate: 1945/10/07 Sex: female Admission Date (Current Location): 02/15/2021  [facility] and [place] Number:  Herbalist and Address:  [facility],  [address], [place]      Provider Number: [id number]  Attending Physician Name and Address:  Alma Friendly, MD  Relative Name and Phone Number:  Alessandra Grout (Daughter)   [phone]    Current Level of Care: Hospital Recommended Level of Care: [place] Prior Approval Number:    Date Approved/Denied:   PASRR Number: [id number]  Discharge Plan: SNF    Current Diagnoses: Patient Active Problem List   Diagnosis Date Noted   Sepsis ([place]) 02/15/2021   Polyarthralgia    Wernicke's aphasia    Bilateral leg ulcer, limited to breakdown of skin ([place]) 01/16/2020   Allergic rhinitis 07/28/2019   Postnasal drip 11/16/2017   Sensorineural hearing loss (SNHL), bilateral 11/16/2017   Tinnitus of both ears 11/16/2017   Obesity, Class II, BMI 35-39.9, isolated (see actual BMI) 11/08/2017   CKD (chronic kidney disease), stage III ([place]) 11/08/2017   Anemia 11/07/2017   Thrombocytosis 11/07/2017   Lymphedema 11/07/2017   AKI (acute kidney injury) ([place]) 10/16/2017   Routine general medical examination at a health care facility 05/10/2014   Paroxysmal SVT (supraventricular tachycardia) ([place]) 11/28/2013   Physical deconditioning 11/23/2013   Generalized weakness 11/17/2013   Multilevel spine pain 11/17/2013   Aortic stenosis, mild 11/17/2013   DM type 2 (diabetes mellitus, type 2) ([place]) 02/02/2013   Arthritis 10/19/2012   Mixed incontinence 10/19/2012   GERD (gastroesophageal reflux disease) 04/20/2011   HTN (hypertension) 01/20/2011   Morbid obesity ([place])     Orientation RESPIRATION BLADDER Height & Weight     Self, Situation, Place  Normal External catheter  Weight: 110.7 kg Height:  5\' 11"  (180.3 cm)  BEHAVIORAL SYMPTOMS/MOOD NEUROLOGICAL BOWEL NUTRITION STATUS      Incontinent Diet (see d/c summary)  AMBULATORY STATUS COMMUNICATION OF NEEDS Skin   Extensive Assist Verbally Other (Comment) (R inner upper thigh, pink, dry partial thinkness wounds)                       Personal Care Assistance Level of Assistance  Bathing, Feeding, Dressing Bathing Assistance: Maximum assistance Feeding assistance: Independent Dressing Assistance: Maximum assistance     Functional Limitations Info  Sight, Hearing, Speech Sight Info: Adequate Hearing Info: Adequate Speech Info: Adequate    SPECIAL CARE FACTORS FREQUENCY                       Contractures Contractures Info: Not present    Additional Factors Info  Code Status, Allergies Code Status Info: full Allergies Info: Oxycodone, Penicillins           Current Medications (02/18/2021):  This is the current hospital active medication list Current Facility-Administered Medications  Medication Dose Route Frequency Provider Last Rate Last Admin   (feeding supplement) PROSource Plus liquid 30 mL  30 mL Oral BID BM Alma Friendly, MD   30 mL at 02/18/21 0923   acetaminophen (TYLENOL) tablet 650 mg  650 mg Oral Q6H PRN Cherylann Ratel A, DO   650 mg at 02/17/21 2235   Or   acetaminophen (TYLENOL) suppository 650 mg  650 mg Rectal Q6H PRN Cherylann Ratel A, DO       apixaban (ELIQUIS) tablet  2.5 mg  2.5 mg Oral BID Marylyn Ishihara, Tyrone A, DO   2.5 mg at 02/18/21 0918   ceFEPIme (MAXIPIME) 2 g in sodium chloride 0.9 % 100 mL IVPB  2 g Intravenous Q8H Wofford, Drew A, RPH 200 mL/hr at 02/18/21 6468 2 g at 02/18/21 0321   feeding supplement (ENSURE ENLIVE / ENSURE PLUS) liquid 237 mL  237 mL Oral BID BM Alma Friendly, MD   237 mL at 02/18/21 0918   [START ON 02/19/2021] ferrous sulfate tablet 325 mg  325 mg Oral Q breakfast Alma Friendly, MD       fluconazole (DIFLUCAN) tablet 100  mg  100 mg Oral Daily Alma Friendly, MD   100 mg at 02/18/21 0919   fluticasone (FLONASE) 50 MCG/ACT nasal spray 1 spray  1 spray Each Nare Daily Alma Friendly, MD   1 spray at 22/48/25 0037   folic acid (FOLVITE) tablet 1 mg  1 mg Oral Daily Marylyn Ishihara, Tyrone A, DO   1 mg at 02/18/21 0488   ibuprofen (ADVIL) tablet 200 mg  200 mg Oral Q6H PRN Alma Friendly, MD       insulin aspart (novoLOG) injection 0-15 Units  0-15 Units Subcutaneous TID WC Kyle, Tyrone A, DO   2 Units at 02/17/21 1221   insulin aspart (novoLOG) injection 0-5 Units  0-5 Units Subcutaneous QHS Kyle, Tyrone A, DO       ketotifen (ZADITOR) 0.025 % ophthalmic solution 1 drop  1 drop Both Eyes Daily Alma Friendly, MD   1 drop at 02/18/21 0931   metoprolol tartrate (LOPRESSOR) tablet 25 mg  25 mg Oral BID Alma Friendly, MD   25 mg at 02/18/21 0919   montelukast (SINGULAIR) tablet 10 mg  10 mg Oral QHS Alma Friendly, MD   10 mg at 02/17/21 2232   morphine 2 MG/ML injection 2 mg  2 mg Intravenous Q4H PRN Cherylann Ratel A, DO   2 mg at 02/15/21 1810   multivitamin with minerals tablet 1 tablet  1 tablet Oral Daily Marylyn Ishihara, Tyrone A, DO   1 tablet at 02/18/21 0922   ondansetron (ZOFRAN) tablet 4 mg  4 mg Oral Q6H PRN Cherylann Ratel A, DO       Or   ondansetron (ZOFRAN) injection 4 mg  4 mg Intravenous Q6H PRN Marylyn Ishihara, Tyrone A, DO   4 mg at 02/17/21 1221   oxyCODONE (Oxy IR/ROXICODONE) immediate release tablet 5 mg  5 mg Oral Q6H PRN Alma Friendly, MD   5 mg at 02/18/21 8916   pantoprazole (PROTONIX) EC tablet 40 mg  40 mg Oral BID Alma Friendly, MD   40 mg at 02/18/21 0919   polyethylene glycol (MIRALAX / GLYCOLAX) packet 17 g  17 g Oral BID Alma Friendly, MD   17 g at 02/17/21 2230   polyvinyl alcohol (LIQUIFILM TEARS) 1.4 % ophthalmic solution 1 drop  1 drop Both Eyes PRN Alma Friendly, MD       senna-docusate (Senokot-S) tablet 1 tablet  1 tablet Oral BID Alma Friendly, MD    1 tablet at 02/17/21 2235   sertraline (ZOLOFT) tablet 25 mg  25 mg Oral Daily Alma Friendly, MD   25 mg at 02/18/21 9450   sodium chloride (OCEAN) 0.65 % nasal spray 1 spray  1 spray Each Nare Q2H PRN Alma Friendly, MD       sucralfate (CARAFATE) tablet 1 g  1 g Oral TID AC & HS Alma Friendly, MD   1 g at 02/18/21 2026   thiamine tablet 100 mg  100 mg Oral Daily Kyle, Tyrone A, DO   100 mg at 02/18/21 6916     Discharge Medications: Please see discharge summary for a list of discharge medications.  Relevant Imaging Results:  Relevant Lab Results:   Additional Information SSN [place], [place]

## 2021-02-18 NOTE — Progress Notes (Signed)
Nutrition Follow-up  DOCUMENTATION CODES:   Severe malnutrition in context of social or environmental circumstances  INTERVENTION:   -Ensure Enlive po BID, each supplement provides 350 kcal and 20 grams of protein   -Continue vitamin regimen   -Prosource Plus PO BID, each provides 100 kcals and 15g protein   NEW NUTRITION DIAGNOSIS:   Severe Malnutrition related to social / environmental circumstances as evidenced by severe muscle depletion, energy intake < or equal to 50% for > or equal to 1 month.  GOAL:   Patient will meet greater than or equal to 90% of their needs  Progressing  MONITOR:   Supplement acceptance, PO intake, Labs, Weight trends, I & O's  ASSESSMENT:   75 y.o. female with medical history significant of HTN, DVT on eliquis, wernicke's, DM2. Presenting with fevers and body aches.  Patient in room, lunch tray sitting at bedside. States she is planning to eat lunch, just woke up after receiving pain medication. Pt states she is still in some pain r/t lymphedema and edema in arms/hands.  Pt states she has not been happy with her living situation for the past year, does not like the food at her facility. Unable to identify some food items with meals so she doesn't eat. Has been diagnosed with malnutrition and vitamin deficiencies in the past. States she has to take a lot of medications which fill her up so she doesn't eat as much. Has an Ensure open, states she plans to drink them. States her facility doesn't always provide them for her but she was told to drink protein supplements in that past.  Medications: Folic acid, Multivitamin with minerals daily, Senokot, Carafate, thiamine, ferric gluconate  Labs reviewed:  CBGs: 90-124  NUTRITION - FOCUSED PHYSICAL EXAM:  Flowsheet Row Most Recent Value  Orbital Region No depletion  Upper Arm Region No depletion  [edema]  Thoracic and Lumbar Region Unable to assess  Buccal Region No depletion  Temple Region Mild  depletion  Clavicle Bone Region Severe depletion  Clavicle and Acromion Bone Region Severe depletion  Scapular Bone Region Moderate depletion  Dorsal Hand No depletion  [edema]  Patellar Region No depletion  Anterior Thigh Region No depletion  Posterior Calf Region No depletion  [edema]  Edema (RD Assessment) Mild  Hair Reviewed  [covered but reports hair loss]  Eyes Reviewed  [reports vision changes]  Mouth Reviewed  [dry lips]  Skin Reviewed  Nails Reviewed       Diet Order:   Diet Order             Diet Carb Modified Fluid consistency: Thin; Room service appropriate? Yes  Diet effective now                   EDUCATION NEEDS:   No education needs have been identified at this time  Skin:  Skin Assessment: Reviewed RN Assessment  Last BM:  11/14 -type 7  Height:   Ht Readings from Last 1 Encounters:  02/15/21 5\' 11"  (1.803 m)    Weight:   Wt Readings from Last 1 Encounters:  02/15/21 110.7 kg    BMI:  Body mass index is 34.04 kg/m.  Estimated Nutritional Needs:   Kcal:  2000-2200  Protein:  100-115g  Fluid:  2L/day  Clayton Bibles, MS, RD, LDN Inpatient Clinical Dietitian Contact information available via Amion

## 2021-02-18 NOTE — TOC Initial Note (Signed)
Transition of Care [facility]) - Initial/Assessment Note    Patient Details  Name: Teigan Manner MRN: [id number] Date of Birth: [birth year]  [facility] CM/SW Contact:    Trish Mage, LCSW Phone Number: 02/18/2021, 8:18 AM  Clinical Narrative:    Spoke with daughter Janey Greaser who states her mother is very unhappy at [facility].  She has been there since the beginning of the year, turned down for MCD once and daughter is re-applying.  She asked if it was possible for her to be relocated to a different facility.  They have asked for help with this from [facility] but do not feel like they are getting the help they need.  I explained that it is a long shot, but we can certainly send her information out to see if someone would like to make a bed offer. TOC will continue to follow during the course of hospitalization.                Expected Discharge Plan: Skilled Nursing Facility Barriers to Discharge: SNF Pending bed offer   Patient Goals and CMS Choice Patient states their goals for this hospitalization and ongoing recovery are:: Patient wants a different facility   Choice offered to / list presented to : Adult Children  Expected Discharge Plan and Services Expected Discharge Plan: [place]   Discharge Planning Services: CM Consult Post Acute Care Choice: [place] Living arrangements for the past 2 months: [place]                                      Prior Living Arrangements/Services Living arrangements for the past 2 months: [place] Lives with:: Facility Resident Patient language and need for interpreter reviewed:: Yes        Need for Family Participation in Patient Care: Yes (Comment) Care giver support system in place?: Yes (comment)   Criminal Activity/Legal Involvement Pertinent to Current Situation/Hospitalization: No - Comment as needed  Activities of Daily Living      Permission  Sought/Granted Permission sought to share information with : Family Supports Permission granted to share information with : Yes, Verbal Permission Granted  Share Information with NAME: Alessandra Grout (Daughter)   [phone]           Emotional Assessment Appearance:: Appears stated age     Orientation: : Oriented to Self, Oriented to Place, Oriented to Situation Alcohol / Substance Use: Not Applicable Psych Involvement: No (comment)  Admission diagnosis:  Hypokalemia [E87.6] Nausea [R11.0] Constipation [K59.00] Sepsis ([place]) [A41.9] Sepsis, due to unspecified organism, unspecified whether acute organ dysfunction present [facility]) [A41.9] Patient Active Problem List   Diagnosis Date Noted   Sepsis ([place]) 02/15/2021   Polyarthralgia    Wernicke's aphasia    Bilateral leg ulcer, limited to breakdown of skin ([place]) 01/16/2020   Allergic rhinitis 07/28/2019   Postnasal drip 11/16/2017   Sensorineural hearing loss (SNHL), bilateral 11/16/2017   Tinnitus of both ears 11/16/2017   Obesity, Class II, BMI 35-39.9, isolated (see actual BMI) 11/08/2017   CKD (chronic kidney disease), stage III ([place]) 11/08/2017   Anemia 11/07/2017   Thrombocytosis 11/07/2017   Lymphedema 11/07/2017   AKI (acute kidney injury) ([place]) 10/16/2017   Routine general medical examination at a health care facility 05/10/2014   Paroxysmal SVT (supraventricular tachycardia) ([place]) 11/28/2013   Physical deconditioning 11/23/2013   Generalized weakness 11/17/2013   Multilevel  spine pain 11/17/2013   Aortic stenosis, mild 11/17/2013   DM type 2 (diabetes mellitus, type 2) ([place]) 02/02/2013   Arthritis 10/19/2012   Mixed incontinence 10/19/2012   GERD (gastroesophageal reflux disease) 04/20/2011   HTN (hypertension) 01/20/2011   Morbid obesity ([place])    PCP:  Hoyt Koch, MD Pharmacy:   [id number] - [place], [place] [address] Phone: [phone] Fax: [phone]  [place], [place] - [address] [place] [address] Phone: [phone] Fax: [phone]     Social Determinants of Health ([place]) Interventions    Readmission Risk Interventions No flowsheet data found.

## 2021-02-18 NOTE — Progress Notes (Signed)
PROGRESS NOTE  [name] [name] NTZ:[id number] DOB: [birth year] DOA: 02/15/2021 PCP: Hoyt Koch, MD  HPI/Recap of past 24 hours: [name] [name] is a 75 y.o. female with medical history significant of HTN, DVT on eliquis, wernicke's, DM2, chronic lymphedema, OA, polyarticular pseudogout. Presenting with fevers and body aches. Her symptoms started 4 days ago w/ general body aches and swelling in her hands. It made it difficult to grip things (especially with the left hand). She tried ibuprofen and APAP, but it did not help. Reported decreased appetite and some nausea. No other overt symptoms. Her facility became concerned and sent her to the ED for evaluation.  Of note, patient was last admitted 03/2020, and was diagnosed with polyarticular pseudogout of bilateral knees.  In the ED, patient was noted to be febrile 100.6 F, tachycardic.  Labs showed hypokalemia, leukocytosis.  Urine culture, blood culture were obtained.  Patient admitted for further management.     Today, patient denies any new complaints, still reports pain swelling in left hand and wrist.    Assessment/Plan: Active Problems:   Sepsis ([place])   Protein-calorie malnutrition, severe   Sepsis likely 2/2 ?fungal UTI Vs ?pseudogout flare up in b/l hands On admission, noted to be febrile, tachycardic with leukocytosis Currently afebrile, with resolved leukocytosis UA unremarkable, but UC growing 80,000 yeast BC x2 NGTD CRP 29.7, ESR elevated Procalcitonin 1.47-->0.89 Chest x-ray unremarkable Bilateral hand x-ray showed severe OA, noted possible chondrocalcinosis around wrist Further inflammatory markers pending (ANA, ANCA) Continue fluconazole for fungal UTI Continue cefepime for 5 days, last day 02/19/21 Stopped vancomycin and Flagyl   ?Pseudogout flareup in BUE Worse in L hand and wrist CRP, ESR elevated Bilateral hand x-ray showed severe OA, noted possible chondrocalcinosis around wrist Awaiting further  inflammatory markers Start PO steroids for 5 days Cautious use of NSAIDs due to history of multiple duodenal ulcers in 2021  Hypokalemia Replace as needed Daily BMP  Iron def anemia Hemoglobin currently around baseline Denies any recent bleeding Anemia panel showed iron 10, sat 7, ferritin 292, folate 23, vitamin B12 >1000 Start IV iron X 2 days, then PO iron Retic panel done Daily CBC  Thrombocytosis Likely reactive Daily CBC  Hypertension Continue Lopressor  History of DVT Continue Eliquis  GERD/Hx of multiple duodenal ulcers Noted EGD in 03/2020 Continue PPI twice daily, sucralfate Cautious use of NSAIDs  Constipation KUB showed moderate stool Strict bowel regimen  History of chronic lymphedema WOC consult Patient reports usually gets bilateral lower extremity wrapped  History of Warnicke's encephalopathy Continue daily thiamine  Deconditioned Has been in SNF for about a year now PT/OT  Obesity Lifestyle modification advised   Malnutrition Type:  Nutrition Problem: Severe Malnutrition Etiology: social / environmental circumstances   Malnutrition Characteristics:  Signs/Symptoms: severe muscle depletion, energy intake < or equal to 50% for > or equal to 1 month   Nutrition Interventions:  Interventions: Ensure Enlive (each supplement provides 350kcal and 20 grams of protein), Prostat    Estimated body mass index is 34.04 kg/m as calculated from the following:   Height as of this encounter: _0  (1.803 m).   Weight as of this encounter: 110.7 kg.     Code Status: Full  Family Communication: None at bedside  Disposition Plan: Status is: Inpatient  Remains inpatient appropriate because: Level of care     Consultants: None  Procedures: None  Antimicrobials: Cefepime Fluconazole  DVT prophylaxis: Eliquis   Objective: Vitals:   02/18/21 0600 02/18/21 0700 02/18/21 0919  02/18/21 1328  BP:  138/84 137/76 129/66  Pulse:   88 89 75  Resp: _0 Temp:  99.3 F (37.4 C)  98.7 F (37.1 C)  TempSrc:  Oral  Oral  SpO2:  95%  100%  Weight:      Height:        Intake/Output Summary (Last 24 hours) at 02/18/2021 1719 Last data filed at 02/18/2021 0536 Gross per 24 hour  Intake 100 ml  Output 150 ml  Net -50 ml   Filed Weights   02/15/21 0051 02/15/21 0925  Weight: 127 kg 110.7 kg    Exam: General: NAD  Cardiovascular: S1, S2 present Respiratory: CTAB Abdomen: Soft, nontender, nondistended, bowel sounds present Musculoskeletal: Noted BLE chronic skin changes, noted L hand and wrist swelling Skin: As above Psychiatry: Normal mood     Data Reviewed: CBC: Recent Labs  Lab 02/15/21 0142 02/16/21 0543 02/17/21 0505 02/18/21 0505  WBC 12.3* 9.1 9.5 8.2  NEUTROABS 8.9*  --  7.1 5.6  HGB 9.0* 8.5* 8.5* 8.0*  HCT 28.8* 28.0* 27.7* 27.1*  MCV 72.7* 73.1* 73.1* 75.1*  PLT 510* 525* 556* 242*   Basic Metabolic Panel: Recent Labs  Lab 02/15/21 0833 02/15/21 1033 02/15/21 1922 02/16/21 0543 02/17/21 0505 02/18/21 0505  NA  --  139 139 140 141 142  K  --  2.5* 2.9* 2.7* 3.2* 3.8  CL  --  100 103 105 108 108  CO2  --  _1 GLUCOSE  --  123* 149* 115* 122* 101*  BUN  --  _2 CREATININE  --  0.82 0.68 0.63 0.61 0.69  CALCIUM  --  8.8* 8.7* 8.8* 8.7* 8.9  MG 1.9  --   --   --  2.0  --   PHOS  --  3.1 2.7  --   --   --    GFR: Estimated Creatinine Clearance: 84.5 mL/min (by C-G formula based on SCr of 0.69 mg/dL). Liver Function Tests: Recent Labs  Lab 02/15/21 0142 02/15/21 1033 02/15/21 1922 02/16/21 0543  AST 30  --   --  16  ALT 21  --   --  17  ALKPHOS 95  --   --  82  BILITOT 1.0  --   --  0.6  PROT 6.9  --   --  6.4*  ALBUMIN 2.4* 2.3* 2.5* 2.2*   No results for input(s): LIPASE, AMYLASE in the last 168 hours. No results for input(s): AMMONIA in the last 168 hours. Coagulation Profile: Recent Labs  Lab 02/15/21 0142 02/16/21 0543  INR  1.5* 1.6*   Cardiac Enzymes: No results for input(s): CKTOTAL, CKMB, CKMBINDEX, TROPONINI in the last 168 hours. BNP (last 3 results) No results for input(s): PROBNP in the last 8760 hours. HbA1C: No results for input(s): HGBA1C in the last 72 hours.  CBG: Recent Labs  Lab 02/17/21 1751 02/17/21 2251 02/18/21 0734 02/18/21 1225 02/18/21 1700  GLUCAP 91 90 96 98 162*   Lipid Profile: No results for input(s): CHOL, HDL, LDLCALC, TRIG, CHOLHDL, LDLDIRECT in the last 72 hours. Thyroid Function Tests: No results for input(s): TSH, T4TOTAL, FREET4, T3FREE, THYROIDAB in the last 72 hours. Anemia Panel: Recent Labs    02/17/21 0505  VITAMINB12 1,080*  FOLATE 23.0  FERRITIN 292  TIBC 135*  IRON 10*  RETICCTPCT 0.7   Urine analysis:    Component Value Date/Time  COLORURINE AMBER (A) 02/15/2021 0356   APPEARANCEUR HAZY (A) 02/15/2021 0356   LABSPEC 1.033 (H) 02/15/2021 0356   PHURINE 5.0 02/15/2021 0356   GLUCOSEU NEGATIVE 02/15/2021 0356   GLUCOSEU NEGATIVE 02/02/2020 1220   HGBUR MODERATE (A) 02/15/2021 0356   BILIRUBINUR SMALL (A) 02/15/2021 0356   BILIRUBINUR Neg 10/22/2017 0950   KETONESUR 20 (A) 02/15/2021 0356   PROTEINUR 100 (A) 02/15/2021 0356   UROBILINOGEN 0.2 02/02/2020 1220   NITRITE NEGATIVE 02/15/2021 0356   LEUKOCYTESUR TRACE (A) 02/15/2021 0356   Sepsis Labs: _0 (procalcitonin:4,lacticidven:4)  ) Recent Results (from the past 240 hour(s))  Resp Panel by RT-PCR (Flu A&B, Covid) Nasopharyngeal Swab     Status: None   Collection Time: 02/15/21  1:41 AM   Specimen: Nasopharyngeal Swab; Nasopharyngeal(NP) swabs in vial transport medium  Result Value Ref Range Status   SARS Coronavirus 2 by RT PCR NEGATIVE NEGATIVE Final    Comment: (NOTE) SARS-CoV-2 target nucleic acids are NOT DETECTED.  The SARS-CoV-2 RNA is generally detectable in upper respiratory specimens during the acute phase of infection. The lowest concentration of SARS-CoV-2 viral  copies this assay can detect is 138 copies/mL. A negative result does not preclude SARS-Cov-2 infection and should not be used as the sole basis for treatment or other patient management decisions. A negative result may occur with  improper specimen collection/handling, submission of specimen other than nasopharyngeal swab, presence of viral mutation(s) within the areas targeted by this assay, and inadequate number of viral copies(<138 copies/mL). A negative result must be combined with clinical observations, patient history, and epidemiological information. The expected result is Negative.  Fact Sheet for Patients:  EntrepreneurPulse.com.au  Fact Sheet for Healthcare Providers:  IncredibleEmployment.be  This test is no t yet approved or cleared by the [place] FDA and  has been authorized for detection and/or diagnosis of SARS-CoV-2 by FDA under an Emergency Use Authorization (EUA). This EUA will remain  in effect (meaning this test can be used) for the duration of the COVID-19 declaration under Section 564(b)(1) of the Act, 21 U.S.C.section 360bbb-3(b)(1), unless the authorization is terminated  or revoked sooner.       Influenza A by PCR NEGATIVE NEGATIVE Final   Influenza B by PCR NEGATIVE NEGATIVE Final    Comment: (NOTE) The Xpert Xpress SARS-CoV-2/FLU/RSV plus assay is intended as an aid in the diagnosis of influenza from Nasopharyngeal swab specimens and should not be used as a sole basis for treatment. Nasal washings and aspirates are unacceptable for Xpert Xpress SARS-CoV-2/FLU/RSV testing.  Fact Sheet for Patients: EntrepreneurPulse.com.au  Fact Sheet for Healthcare Providers: IncredibleEmployment.be  This test is not yet approved or cleared by the [place] FDA and has been authorized for detection and/or diagnosis of SARS-CoV-2 by FDA under an Emergency Use Authorization (EUA). This  EUA will remain in effect (meaning this test can be used) for the duration of the COVID-19 declaration under Section 564(b)(1) of the Act, 21 U.S.C. section 360bbb-3(b)(1), unless the authorization is terminated or revoked.  Performed at [facility], [place] [address], [place], [place] [address]   Blood Culture (routine x 2)     Status: None (Preliminary result)   Collection Time: 02/15/21  1:42 AM   Specimen: BLOOD  Result Value Ref Range Status   Specimen Description   Final    BLOOD BLOOD RIGHT HAND Performed at [facility] [address], [place], [place] [address]    Special Requests   Final  BOTTLES DRAWN AEROBIC AND ANAEROBIC Blood Culture results may not be optimal due to an excessive volume of blood received in culture bottles Performed at [facility], [place] [address] [place], [place] [address]    Culture   Final    NO GROWTH 3 DAYS Performed at [facility], [place] [address], [place], [place] [address]    Report Status PENDING  Incomplete  Blood Culture (routine x 2)     Status: None (Preliminary result)   Collection Time: 02/15/21  1:42 AM   Specimen: BLOOD  Result Value Ref Range Status   Specimen Description   Final    BLOOD RIGHT WRIST Performed at [facility] [address] [place], [place] [address]    Special Requests   Final    BOTTLES DRAWN AEROBIC AND ANAEROBIC Blood Culture adequate volume Performed at [facility] [address] [place], [place] [address]    Culture   Final    NO GROWTH 3 DAYS Performed at [facility], [place] [address] [place], [place] [address]    Report Status PENDING  Incomplete  Urine Culture     Status: Abnormal   Collection Time: 02/15/21  3:56 AM   Specimen: In/Out Cath Urine  Result Value Ref Range Status   Specimen Description   Final    IN/OUT CATH URINE Performed at [facility]  [address] [place], [place] [address]    Special Requests   Final    Normal Performed at [facility], [place] [address] [place], [place] [address]    Culture 80,000 COLONIES/mL YEAST (A)  Final   Report Status 02/16/2021 FINAL  Final      Studies: No results found.  Scheduled Meds:  (feeding supplement) PROSource Plus  30 mL Oral BID BM   apixaban  2.5 mg Oral BID   feeding supplement  237 mL Oral BID BM   [START ON 02/19/2021] ferrous sulfate  325 mg Oral Q breakfast   fluconazole  100 mg Oral Daily   fluticasone  1 spray Each Nare Daily   folic acid  1 mg Oral Daily   insulin aspart  0-15 Units Subcutaneous TID WC   insulin aspart  0-5 Units Subcutaneous QHS   ketotifen  1 drop Both Eyes Daily   metoprolol tartrate  25 mg Oral BID   montelukast  10 mg Oral QHS   multivitamin with minerals  1 tablet Oral Daily   pantoprazole  40 mg Oral BID   polyethylene glycol  17 g Oral BID   predniSONE  40 mg Oral Q breakfast   senna-docusate  1 tablet Oral BID   sertraline  25 mg Oral Daily   sucralfate  1 g Oral TID AC & HS   thiamine  100 mg Oral Daily    Continuous Infusions:  ceFEPime (MAXIPIME) IV 2 g (02/18/21 0922)     LOS: 3 days     Alma Friendly, MD [facility]  If 7PM-7AM, please contact night-coverage [email or web address] 02/18/2021, 5:19 PM

## 2021-02-19 DIAGNOSIS — E43 Unspecified severe protein-calorie malnutrition: Secondary | ICD-10-CM

## 2021-02-19 LAB — CBC WITH DIFFERENTIAL/PLATELET
Abs Immature Granulocytes: 0.2 10*3/uL — ABNORMAL HIGH (ref 0.00–0.07)
Basophils Absolute: 0 10*3/uL (ref 0.0–0.1)
Basophils Relative: 0 %
Eosinophils Absolute: 0 10*3/uL (ref 0.0–0.5)
Eosinophils Relative: 0 %
HCT: 26.3 % — ABNORMAL LOW (ref 36.0–46.0)
Hemoglobin: 7.9 g/dL — ABNORMAL LOW (ref 12.0–15.0)
Immature Granulocytes: 2 %
Lymphocytes Relative: 10 %
Lymphs Abs: 1.1 10*3/uL (ref 0.7–4.0)
MCH: 22.3 pg — ABNORMAL LOW (ref 26.0–34.0)
MCHC: 30 g/dL (ref 30.0–36.0)
MCV: 74.1 fL — ABNORMAL LOW (ref 80.0–100.0)
Monocytes Absolute: 0.3 10*3/uL (ref 0.1–1.0)
Monocytes Relative: 2 %
Neutro Abs: 9.1 10*3/uL — ABNORMAL HIGH (ref 1.7–7.7)
Neutrophils Relative %: 86 %
Platelets: 596 10*3/uL — ABNORMAL HIGH (ref 150–400)
RBC: 3.55 MIL/uL — ABNORMAL LOW (ref 3.87–5.11)
RDW: 20.5 % — ABNORMAL HIGH (ref 11.5–15.5)
WBC: 10.7 10*3/uL — ABNORMAL HIGH (ref 4.0–10.5)
nRBC: 0.3 % — ABNORMAL HIGH (ref 0.0–0.2)

## 2021-02-19 LAB — BASIC METABOLIC PANEL
Anion gap: 9 (ref 5–15)
BUN: 17 mg/dL (ref 8–23)
CO2: 26 mmol/L (ref 22–32)
Calcium: 9.1 mg/dL (ref 8.9–10.3)
Chloride: 107 mmol/L (ref 98–111)
Creatinine, Ser: 0.69 mg/dL (ref 0.44–1.00)
GFR, Estimated: >60 mL/min (ref >60)
Glucose, Bld: 159 mg/dL — ABNORMAL HIGH (ref 70–99)
Potassium: 3.8 mmol/L (ref 3.5–5.1)
Sodium: 142 mmol/L (ref 135–145)

## 2021-02-19 LAB — GLUCOSE, CAPILLARY
Glucose-Capillary: 227 mg/dL — ABNORMAL HIGH (ref 70–99)
Glucose-Capillary: 157 mg/dL — ABNORMAL HIGH (ref 70–99)
Glucose-Capillary: 215 mg/dL — ABNORMAL HIGH (ref 70–99)
Glucose-Capillary: 227 mg/dL — ABNORMAL HIGH (ref 70–99)

## 2021-02-19 NOTE — TOC Progression Note (Signed)
Transition of Care [facility]) - Progression Note    Patient Details  Name: [name] [name] MRN: [id number] Date of Birth: 02-Apr-1946  Transition of Care [facility]) CM/SW [place], [place] Phone Number: 02/19/2021, 2:43 PM  Clinical Narrative:   Spoke to both patient and daughter separately to let them know of outcome of bed search. [name] stated she would check out [place] at some point to see if it might be better than current situation.  I encouraged [name] to use her daughter as an advocate for her due to her multiple complaints of current facility. Plan to return patient to [facility] when stable for d/c. TOC will continue to follow during the course of hospitalization.     Expected Discharge Plan: [place] Barriers to Discharge: SNF Pending bed offer  Expected Discharge Plan and Services Expected Discharge Plan: [place]   Discharge Planning Services: CM Consult Post Acute Care Choice: [place] Living arrangements for the past 2 months: [place]                                       Social Determinants of Health (SDOH) Interventions    Readmission Risk Interventions No flowsheet data found.

## 2021-02-19 NOTE — Progress Notes (Addendum)
I observed crushed medications in the pill crusher during medication pass. I was going to use the pill crusher when I saw the medications. Medications were shown to Dance movement psychotherapist Hydrographic surveyor) and then tossed them into the sharp container. No acute distress noted. Will continue to monitor.

## 2021-02-19 NOTE — Care Management Important Message (Signed)
Important Message  Patient Details IM Letter given to the Patient. Name: [name] [name] MRN: [id number] Date of Birth: 1945-09-15   Medicare Important Message Given:  Yes     Kerin Salen 02/19/2021, 12:58 PM

## 2021-02-19 NOTE — Progress Notes (Signed)
PROGRESS NOTE    [name] [name]  RSW:[id number] DOB: 1946-01-30 DOA: 02/15/2021 PCP: Hoyt Koch, MD    Brief Narrative:  75 y.o. female with medical history significant of HTN, DVT on eliquis, wernicke's, DM2, chronic lymphedema, OA, polyarticular pseudogout. Presenting with fevers and body aches. Her symptoms started 4 days ago w/ general body aches and swelling in her hands. It made it difficult to grip things (especially with the left hand). She tried ibuprofen and APAP, but it did not help. Reported decreased appetite and some nausea. No other overt symptoms. Her facility became concerned and sent her to the ED for evaluation.  Of note, patient was last admitted 03/2020, and was diagnosed with polyarticular pseudogout of bilateral knees.  In the ED, patient was noted to be febrile 100.6 F, tachycardic.  Labs showed hypokalemia, leukocytosis.  Urine culture, blood culture were obtained.  Patient admitted for further management  Assessment & Plan:   Active Problems:   Sepsis ([place])   Protein-calorie malnutrition, severe  Sepsis likely 2/2 ?fungal UTI Vs ?pseudogout flare up in b/l hands On admission, noted to be febrile, tachycardic with leukocytosis Currently afebrile, with resolved leukocytosis UA unremarkable, but UC growing 80,000 yeast BC x2 NGTD CRP 29.7, ESR elevated Procalcitonin 1.47-->0.89 Chest x-ray unremarkable Bilateral hand x-ray showed severe OA, noted possible chondrocalcinosis around wrist Further inflammatory markers pending (ANA, ANCA) Continue fluconazole for fungal UTI To complete cefepime for 5 days, last dose today Stopped vancomycin and Flagyl    ?Pseudogout flareup in BUE Worse in L hand and wrist CRP, ESR elevated Bilateral hand x-ray showed severe OA, noted possible chondrocalcinosis around wrist Awaiting further inflammatory markers Now on PO steroids for 5 days total. Pt reports some improvement Cautious use of NSAIDs due to history of  multiple duodenal ulcers in 2021 Cont with therapy   Hypokalemia Replace as needed Daily BMP   Iron def anemia Hemoglobin currently around baseline Denies any recent bleeding Anemia panel showed iron 10, sat 7, ferritin 292, folate 23, vitamin B12 >1000 Start IV iron X 2 days, then PO iron Retic panel done Daily CBC   Thrombocytosis Likely reactive Stable at this time Repeat CBC in AM   Hypertension Continue Lopressor   History of DVT Continue Eliquis No evidence of acute blood loss   GERD/Hx of multiple duodenal ulcers Noted EGD in 03/2020 Continue PPI twice daily, sucralfate Cautious use of NSAIDs   Constipation KUB showed moderate stool Strict bowel regimen   History of chronic lymphedema WOC consult Patient reports usually gets bilateral lower extremity wrapped   History of Warnicke's encephalopathy Continue daily thiamine   Deconditioned Has been in SNF for about a year now PT/OT with recs for SNF, TOC following for placement   Obesity Lifestyle modification advised     Malnutrition Type:   Nutrition Problem: Severe Malnutrition Etiology: social / environmental circumstances     Malnutrition Characteristics:   Signs/Symptoms: severe muscle depletion, energy intake < or equal to 50% for > or equal to 1 month     Nutrition Interventions:   Interventions: Ensure Enlive (each supplement provides 350kcal and 20 grams of protein), Prostat     Estimated body mass index is 34.04 kg/m as calculated from the following:   Height as of this encounter: '5\' 11"'  (1.803 m).   Weight as of this encounter: 110.7 kg.     DVT prophylaxis: Eliquis Code Status: Full Family Communication: Pt in room, family not at bedside  Status is: Inpatient  Remains inpatient appropriate because: Severity of illness and d/c planning   Consultants:    Procedures:    Antimicrobials: Anti-infectives (From admission, onward)    Start     Dose/Rate Route Frequency  Ordered Stop   02/16/21 1800  fluconazole (DIFLUCAN) tablet 100 mg        100 mg Oral Daily 02/16/21 1600     02/15/21 2200  vancomycin (VANCOREADY) IVPB 1750 mg/350 mL  Status:  Discontinued        1,750 mg 175 mL/hr over 120 Minutes Intravenous Every 24 hours 02/15/21 0920 02/16/21 1558   02/15/21 1600  metroNIDAZOLE (FLAGYL) IVPB 500 mg  Status:  Discontinued        500 mg 100 mL/hr over 60 Minutes Intravenous Every 12 hours 02/15/21 0852 02/16/21 1558   02/15/21 1000  ceFEPIme (MAXIPIME) 2 g in sodium chloride 0.9 % 100 mL IVPB        2 g 200 mL/hr over 30 Minutes Intravenous Every 8 hours 02/15/21 0920 02/19/21 2359   02/15/21 0130  ceFEPIme (MAXIPIME) 2 g in sodium chloride 0.9 % 100 mL IVPB        2 g 200 mL/hr over 30 Minutes Intravenous  Once 02/15/21 0120 02/15/21 0354   02/15/21 0130  metroNIDAZOLE (FLAGYL) IVPB 500 mg        500 mg 100 mL/hr over 60 Minutes Intravenous  Once 02/15/21 0120 02/15/21 0458   02/15/21 0130  vancomycin (VANCOCIN) IVPB 1000 mg/200 mL premix  Status:  Discontinued        1,000 mg 200 mL/hr over 60 Minutes Intravenous  Once 02/15/21 0120 02/15/21 0122   02/15/21 0130  vancomycin (VANCOREADY) IVPB 2000 mg/400 mL        2,000 mg 200 mL/hr over 120 Minutes Intravenous  Once 02/15/21 0122 02/15/21 0742       Subjective: Feels better  Objective: Vitals:   02/19/21 0500 02/19/21 0600 02/19/21 0635 02/19/21 1225  BP:   112/62 (!) 150/73  Pulse:   92 71  Resp: '17 16 20 20  ' Temp:   98.3 F (36.8 C)   TempSrc:   Oral   SpO2:   96% 94%  Weight:      Height:        Intake/Output Summary (Last 24 hours) at 02/19/2021 1742 Last data filed at 02/19/2021 0851 Gross per 24 hour  Intake 340 ml  Output --  Net 340 ml   Filed Weights   02/15/21 0051 02/15/21 0925  Weight: 127 kg 110.7 kg    Examination: General exam: Awake, laying in bed, in nad Respiratory system: Normal respiratory effort, no wheezing Cardiovascular system: regular rate,  s1, s2 Gastrointestinal system: Soft, nondistended, positive BS Central nervous system: CN2-12 grossly intact, strength intact Extremities: Perfused, no clubbing Skin: Normal skin turgor, no notable skin lesions seen Psychiatry: Mood normal // no visual hallucinations   Data Reviewed: I have personally reviewed following labs and imaging studies  CBC: Recent Labs  Lab 02/15/21 0142 02/16/21 0543 02/17/21 0505 02/18/21 0505 02/19/21 0500  WBC 12.3* 9.1 9.5 8.2 10.7*  NEUTROABS 8.9*  --  7.1 5.6 9.1*  HGB 9.0* 8.5* 8.5* 8.0* 7.9*  HCT 28.8* 28.0* 27.7* 27.1* 26.3*  MCV 72.7* 73.1* 73.1* 75.1* 74.1*  PLT 510* 525* 556* 568* 354*   Basic Metabolic Panel: Recent Labs  Lab 02/15/21 0833 02/15/21 1033 02/15/21 1922 02/16/21 0543 02/17/21 0505 02/18/21 0505 02/19/21 0500  NA  --  139 139 140  141 142 142  K  --  2.5* 2.9* 2.7* 3.2* 3.8 3.8  CL  --  100 103 105 108 108 107  CO2  --  '27 26 29 26 26 26  ' GLUCOSE  --  123* 149* 115* 122* 101* 159*  BUN  --  '12 14 16 15 14 17  ' CREATININE  --  0.82 0.68 0.63 0.61 0.69 0.69  CALCIUM  --  8.8* 8.7* 8.8* 8.7* 8.9 9.1  MG 1.9  --   --   --  2.0  --   --   PHOS  --  3.1 2.7  --   --   --   --    GFR: Estimated Creatinine Clearance: 84.5 mL/min (by C-G formula based on SCr of 0.69 mg/dL). Liver Function Tests: Recent Labs  Lab 02/15/21 0142 02/15/21 1033 02/15/21 1922 02/16/21 0543  AST 30  --   --  16  ALT 21  --   --  17  ALKPHOS 95  --   --  82  BILITOT 1.0  --   --  0.6  PROT 6.9  --   --  6.4*  ALBUMIN 2.4* 2.3* 2.5* 2.2*   No results for input(s): LIPASE, AMYLASE in the last 168 hours. No results for input(s): AMMONIA in the last 168 hours. Coagulation Profile: Recent Labs  Lab 02/15/21 0142 02/16/21 0543  INR 1.5* 1.6*   Cardiac Enzymes: No results for input(s): CKTOTAL, CKMB, CKMBINDEX, TROPONINI in the last 168 hours. BNP (last 3 results) No results for input(s): PROBNP in the last 8760 hours. HbA1C: No  results for input(s): HGBA1C in the last 72 hours. CBG: Recent Labs  Lab 02/18/21 1700 02/18/21 2234 02/19/21 0737 02/19/21 1137 02/19/21 1709  GLUCAP 162* 127* 227* 157* 215*   Lipid Profile: No results for input(s): CHOL, HDL, LDLCALC, TRIG, CHOLHDL, LDLDIRECT in the last 72 hours. Thyroid Function Tests: No results for input(s): TSH, T4TOTAL, FREET4, T3FREE, THYROIDAB in the last 72 hours. Anemia Panel: Recent Labs    02/17/21 0505  VITAMINB12 1,080*  FOLATE 23.0  FERRITIN 292  TIBC 135*  IRON 10*  RETICCTPCT 0.7   Sepsis Labs: Recent Labs  Lab 02/15/21 0142 02/15/21 0905 02/16/21 0543 02/17/21 0505 02/18/21 0505  PROCALCITON  --   --  1.47 0.89 0.60  LATICACIDVEN 1.1 1.1  --   --   --     Recent Results (from the past 240 hour(s))  Resp Panel by RT-PCR (Flu A&B, Covid) Nasopharyngeal Swab     Status: None   Collection Time: 02/15/21  1:41 AM   Specimen: Nasopharyngeal Swab; Nasopharyngeal(NP) swabs in vial transport medium  Result Value Ref Range Status   SARS Coronavirus 2 by RT PCR NEGATIVE NEGATIVE Final    Comment: (NOTE) SARS-CoV-2 target nucleic acids are NOT DETECTED.  The SARS-CoV-2 RNA is generally detectable in upper respiratory specimens during the acute phase of infection. The lowest concentration of SARS-CoV-2 viral copies this assay can detect is 138 copies/mL. A negative result does not preclude SARS-Cov-2 infection and should not be used as the sole basis for treatment or other patient management decisions. A negative result may occur with  improper specimen collection/handling, submission of specimen other than nasopharyngeal swab, presence of viral mutation(s) within the areas targeted by this assay, and inadequate number of viral copies(<138 copies/mL). A negative result must be combined with clinical observations, patient history, and epidemiological information. The expected result is Negative.  Fact Sheet for  Patients:   EntrepreneurPulse.com.au  Fact Sheet for Healthcare Providers:  IncredibleEmployment.be  This test is no t yet approved or cleared by the [place] FDA and  has been authorized for detection and/or diagnosis of SARS-CoV-2 by FDA under an Emergency Use Authorization (EUA). This EUA will remain  in effect (meaning this test can be used) for the duration of the COVID-19 declaration under Section 564(b)(1) of the Act, 21 U.S.C.section 360bbb-3(b)(1), unless the authorization is terminated  or revoked sooner.       Influenza A by PCR NEGATIVE NEGATIVE Final   Influenza B by PCR NEGATIVE NEGATIVE Final    Comment: (NOTE) The Xpert Xpress SARS-CoV-2/FLU/RSV plus assay is intended as an aid in the diagnosis of influenza from Nasopharyngeal swab specimens and should not be used as a sole basis for treatment. Nasal washings and aspirates are unacceptable for Xpert Xpress SARS-CoV-2/FLU/RSV testing.  Fact Sheet for Patients: EntrepreneurPulse.com.au  Fact Sheet for Healthcare Providers: IncredibleEmployment.be  This test is not yet approved or cleared by the [place] FDA and has been authorized for detection and/or diagnosis of SARS-CoV-2 by FDA under an Emergency Use Authorization (EUA). This EUA will remain in effect (meaning this test can be used) for the duration of the COVID-19 declaration under Section 564(b)(1) of the Act, 21 U.S.C. section 360bbb-3(b)(1), unless the authorization is terminated or revoked.  Performed at [facility], [place] [address] [place], [place] [address]   Blood Culture (routine x 2)     Status: None (Preliminary result)   Collection Time: 02/15/21  1:42 AM   Specimen: BLOOD  Result Value Ref Range Status   Specimen Description   Final    BLOOD BLOOD RIGHT HAND Performed at [facility] [address] [place], [place] [address]     Special Requests   Final    BOTTLES DRAWN AEROBIC AND ANAEROBIC Blood Culture results may not be optimal due to an excessive volume of blood received in culture bottles Performed at [facility] [address] [place], [place] [address]    Culture   Final    NO GROWTH 4 DAYS Performed at [facility], [place] [address], [place], [place] [address]    Report Status PENDING  Incomplete  Blood Culture (routine x 2)     Status: None (Preliminary result)   Collection Time: 02/15/21  1:42 AM   Specimen: BLOOD  Result Value Ref Range Status   Specimen Description   Final    BLOOD RIGHT WRIST Performed at [facility] [address] [place], [place] [address]    Special Requests   Final    BOTTLES DRAWN AEROBIC AND ANAEROBIC Blood Culture adequate volume Performed at [facility] [address] [name], [place] [address]    Culture   Final    NO GROWTH 4 DAYS Performed at [facility], [place] [name], [place], [place] [address]    Report Status PENDING  Incomplete  Urine Culture     Status: Abnormal   Collection Time: 02/15/21  3:56 AM   Specimen: In/Out Cath Urine  Result Value Ref Range Status   Specimen Description   Final    IN/OUT CATH URINE Performed at [facility] [address], [place], [place] [address]    Special Requests   Final    Normal Performed at [facility], [place] [address], [place],  [address]  Culture 80,000 COLONIES/mL YEAST (A)  Final   Report Status 02/16/2021 FINAL  Final     Radiology Studies: No results found.  Scheduled Meds:  (feeding supplement) PROSource Plus  30 mL Oral BID BM   apixaban  2.5 mg Oral BID   feeding supplement  237 mL Oral BID BM   ferrous sulfate  325 mg Oral Q breakfast   fluconazole  100 mg Oral Daily   fluticasone  1 spray Each Nare Daily   folic acid  1 mg Oral Daily   insulin aspart  0-15 Units  Subcutaneous TID WC   insulin aspart  0-5 Units Subcutaneous QHS   ketotifen  1 drop Both Eyes Daily   metoprolol tartrate  25 mg Oral BID   montelukast  10 mg Oral QHS   multivitamin with minerals  1 tablet Oral Daily   pantoprazole  40 mg Oral BID   polyethylene glycol  17 g Oral BID   predniSONE  40 mg Oral Q breakfast   senna-docusate  1 tablet Oral BID   sertraline  25 mg Oral Daily   sucralfate  1 g Oral TID AC & HS   thiamine  100 mg Oral Daily   Continuous Infusions:  ceFEPime (MAXIPIME) IV 2 g (02/19/21 0917)     LOS: 4 days   Marylu Lund, MD [facility] Pager On Amion  If 7PM-7AM, please contact night-coverage 02/19/2021, 5:42 PM

## 2021-02-20 DIAGNOSIS — E440 Moderate protein-calorie malnutrition: Secondary | ICD-10-CM | POA: Diagnosis not present

## 2021-02-20 DIAGNOSIS — N1831 Chronic kidney disease, stage 3a: Secondary | ICD-10-CM | POA: Diagnosis not present

## 2021-02-20 DIAGNOSIS — G9341 Metabolic encephalopathy: Secondary | ICD-10-CM | POA: Diagnosis not present

## 2021-02-20 DIAGNOSIS — H11823 Conjunctivochalasis, bilateral: Secondary | ICD-10-CM | POA: Diagnosis not present

## 2021-02-20 DIAGNOSIS — E669 Obesity, unspecified: Secondary | ICD-10-CM | POA: Diagnosis not present

## 2021-02-20 DIAGNOSIS — L97911 Non-pressure chronic ulcer of unspecified part of right lower leg limited to breakdown of skin: Secondary | ICD-10-CM | POA: Diagnosis not present

## 2021-02-20 DIAGNOSIS — I82511 Chronic embolism and thrombosis of right femoral vein: Secondary | ICD-10-CM | POA: Diagnosis not present

## 2021-02-20 DIAGNOSIS — N189 Chronic kidney disease, unspecified: Secondary | ICD-10-CM | POA: Diagnosis not present

## 2021-02-20 DIAGNOSIS — G8929 Other chronic pain: Secondary | ICD-10-CM | POA: Diagnosis not present

## 2021-02-20 DIAGNOSIS — I69321 Dysphasia following cerebral infarction: Secondary | ICD-10-CM | POA: Diagnosis not present

## 2021-02-20 DIAGNOSIS — I350 Nonrheumatic aortic (valve) stenosis: Secondary | ICD-10-CM | POA: Diagnosis not present

## 2021-02-20 DIAGNOSIS — R488 Other symbolic dysfunctions: Secondary | ICD-10-CM | POA: Diagnosis not present

## 2021-02-20 DIAGNOSIS — K219 Gastro-esophageal reflux disease without esophagitis: Secondary | ICD-10-CM | POA: Diagnosis not present

## 2021-02-20 DIAGNOSIS — R278 Other lack of coordination: Secondary | ICD-10-CM | POA: Diagnosis not present

## 2021-02-20 DIAGNOSIS — R419 Unspecified symptoms and signs involving cognitive functions and awareness: Secondary | ICD-10-CM | POA: Diagnosis not present

## 2021-02-20 DIAGNOSIS — F32A Depression, unspecified: Secondary | ICD-10-CM | POA: Diagnosis not present

## 2021-02-20 DIAGNOSIS — M1990 Unspecified osteoarthritis, unspecified site: Secondary | ICD-10-CM | POA: Diagnosis not present

## 2021-02-20 DIAGNOSIS — I10 Essential (primary) hypertension: Secondary | ICD-10-CM | POA: Diagnosis not present

## 2021-02-20 DIAGNOSIS — H16223 Keratoconjunctivitis sicca, not specified as Sjogren's, bilateral: Secondary | ICD-10-CM | POA: Diagnosis not present

## 2021-02-20 DIAGNOSIS — H547 Unspecified visual loss: Secondary | ICD-10-CM | POA: Diagnosis not present

## 2021-02-20 DIAGNOSIS — H353131 Nonexudative age-related macular degeneration, bilateral, early dry stage: Secondary | ICD-10-CM | POA: Diagnosis not present

## 2021-02-20 DIAGNOSIS — Z2089 Contact with and (suspected) exposure to other communicable diseases: Secondary | ICD-10-CM | POA: Diagnosis not present

## 2021-02-20 DIAGNOSIS — M6389 Disorders of muscle in diseases classified elsewhere, multiple sites: Secondary | ICD-10-CM | POA: Diagnosis not present

## 2021-02-20 DIAGNOSIS — E43 Unspecified severe protein-calorie malnutrition: Secondary | ICD-10-CM | POA: Diagnosis not present

## 2021-02-20 DIAGNOSIS — E512 Wernicke's encephalopathy: Secondary | ICD-10-CM | POA: Diagnosis not present

## 2021-02-20 DIAGNOSIS — M6281 Muscle weakness (generalized): Secondary | ICD-10-CM | POA: Diagnosis not present

## 2021-02-20 DIAGNOSIS — F0390 Unspecified dementia without behavioral disturbance: Secondary | ICD-10-CM | POA: Diagnosis not present

## 2021-02-20 DIAGNOSIS — H1045 Other chronic allergic conjunctivitis: Secondary | ICD-10-CM | POA: Diagnosis not present

## 2021-02-20 DIAGNOSIS — E119 Type 2 diabetes mellitus without complications: Secondary | ICD-10-CM | POA: Diagnosis not present

## 2021-02-20 DIAGNOSIS — D649 Anemia, unspecified: Secondary | ICD-10-CM | POA: Diagnosis not present

## 2021-02-20 DIAGNOSIS — N390 Urinary tract infection, site not specified: Secondary | ICD-10-CM | POA: Diagnosis not present

## 2021-02-20 DIAGNOSIS — E876 Hypokalemia: Secondary | ICD-10-CM | POA: Diagnosis not present

## 2021-02-20 DIAGNOSIS — H5203 Hypermetropia, bilateral: Secondary | ICD-10-CM | POA: Diagnosis not present

## 2021-02-20 DIAGNOSIS — U071 COVID-19: Secondary | ICD-10-CM | POA: Diagnosis not present

## 2021-02-20 DIAGNOSIS — D509 Iron deficiency anemia, unspecified: Secondary | ICD-10-CM | POA: Diagnosis not present

## 2021-02-20 DIAGNOSIS — N179 Acute kidney failure, unspecified: Secondary | ICD-10-CM | POA: Diagnosis not present

## 2021-02-20 DIAGNOSIS — A419 Sepsis, unspecified organism: Secondary | ICD-10-CM | POA: Diagnosis not present

## 2021-02-20 DIAGNOSIS — F331 Major depressive disorder, recurrent, moderate: Secondary | ICD-10-CM | POA: Diagnosis not present

## 2021-02-20 DIAGNOSIS — H25813 Combined forms of age-related cataract, bilateral: Secondary | ICD-10-CM | POA: Diagnosis not present

## 2021-02-20 DIAGNOSIS — R293 Abnormal posture: Secondary | ICD-10-CM | POA: Diagnosis not present

## 2021-02-20 DIAGNOSIS — I890 Lymphedema, not elsewhere classified: Secondary | ICD-10-CM | POA: Diagnosis not present

## 2021-02-20 DIAGNOSIS — M2550 Pain in unspecified joint: Secondary | ICD-10-CM | POA: Diagnosis not present

## 2021-02-20 DIAGNOSIS — M11249 Other chondrocalcinosis, unspecified hand: Secondary | ICD-10-CM | POA: Diagnosis not present

## 2021-02-20 LAB — BASIC METABOLIC PANEL
Anion gap: 7 (ref 5–15)
BUN: 23 mg/dL (ref 8–23)
CO2: 26 mmol/L (ref 22–32)
Calcium: 8.9 mg/dL (ref 8.9–10.3)
Chloride: 109 mmol/L (ref 98–111)
Creatinine, Ser: 0.74 mg/dL (ref 0.44–1.00)
GFR, Estimated: >60 mL/min (ref >60)
Glucose, Bld: 160 mg/dL — ABNORMAL HIGH (ref 70–99)
Potassium: 3.3 mmol/L — ABNORMAL LOW (ref 3.5–5.1)
Sodium: 142 mmol/L (ref 135–145)

## 2021-02-20 LAB — CBC WITH DIFFERENTIAL/PLATELET
Abs Immature Granulocytes: 0.24 10*3/uL — ABNORMAL HIGH (ref 0.00–0.07)
Basophils Absolute: 0 10*3/uL (ref 0.0–0.1)
Basophils Relative: 0 %
Eosinophils Absolute: 0 10*3/uL (ref 0.0–0.5)
Eosinophils Relative: 0 %
HCT: 25.8 % — ABNORMAL LOW (ref 36.0–46.0)
Hemoglobin: 7.6 g/dL — ABNORMAL LOW (ref 12.0–15.0)
Immature Granulocytes: 2 %
Lymphocytes Relative: 20 %
Lymphs Abs: 2 10*3/uL (ref 0.7–4.0)
MCH: 22.6 pg — ABNORMAL LOW (ref 26.0–34.0)
MCHC: 29.5 g/dL — ABNORMAL LOW (ref 30.0–36.0)
MCV: 76.6 fL — ABNORMAL LOW (ref 80.0–100.0)
Monocytes Absolute: 1 10*3/uL (ref 0.1–1.0)
Monocytes Relative: 10 %
Neutro Abs: 6.8 10*3/uL (ref 1.7–7.7)
Neutrophils Relative %: 68 %
Platelets: 611 10*3/uL — ABNORMAL HIGH (ref 150–400)
RBC: 3.37 MIL/uL — ABNORMAL LOW (ref 3.87–5.11)
RDW: 20.6 % — ABNORMAL HIGH (ref 11.5–15.5)
WBC: 10 10*3/uL (ref 4.0–10.5)
nRBC: 0.4 % — ABNORMAL HIGH (ref 0.0–0.2)

## 2021-02-20 LAB — HEMOGLOBIN AND HEMATOCRIT, BLOOD
HCT: 26.6 % — ABNORMAL LOW (ref 36.0–46.0)
Hemoglobin: 7.9 g/dL — ABNORMAL LOW (ref 12.0–15.0)

## 2021-02-20 LAB — RESP PANEL BY RT-PCR (FLU A&B, COVID) ARPGX2
Influenza A by PCR: NEGATIVE
Influenza B by PCR: NEGATIVE
SARS Coronavirus 2 by RT PCR: NEGATIVE

## 2021-02-20 LAB — CULTURE, BLOOD (ROUTINE X 2)
Culture: NO GROWTH
Culture: NO GROWTH
Special Requests: ADEQUATE

## 2021-02-20 LAB — GLUCOSE, CAPILLARY
Glucose-Capillary: 116 mg/dL — ABNORMAL HIGH (ref 70–99)
Glucose-Capillary: 152 mg/dL — ABNORMAL HIGH (ref 70–99)
Glucose-Capillary: 193 mg/dL — ABNORMAL HIGH (ref 70–99)
Glucose-Capillary: 174 mg/dL — ABNORMAL HIGH (ref 70–99)

## 2021-02-20 MED ORDER — FERROUS SULFATE 325 (65 FE) MG PO TABS: 325.0000 mg | ORAL_TABLET | Freq: Every day | ORAL | 0 refills | Status: DC

## 2021-02-20 MED ORDER — PREDNISONE 20 MG PO TABS
40.0000 mg | ORAL_TABLET | Freq: Every day | ORAL | 0 refills | Status: AC
Start: 2021-02-21 — End: 2021-02-23

## 2021-02-20 MED ORDER — FLUCONAZOLE 50 MG PO TABS: 100.0000 mg | ORAL_TABLET | Freq: Every day | ORAL | 0 refills | Status: AC

## 2021-02-20 MED ORDER — METOPROLOL TARTRATE 25 MG PO TABS: 25.0000 mg | ORAL_TABLET | Freq: Two times a day (BID) | ORAL | 0 refills | Status: DC

## 2021-02-20 MED ORDER — OXYCODONE HCL 5 MG PO TABS: 5.0000 mg | ORAL_TABLET | Freq: Four times a day (QID) | ORAL | 0 refills | Status: DC | PRN

## 2021-02-20 NOTE — TOC Transition Note (Signed)
Transition of Care [facility]) - CM/SW Discharge Note   Patient Details  Name: [name] [name] MRN: [id number] Date of Birth: 1945/07/20  [facility] CM/SW Contact:  Trish Mage, LCSW Phone Number: 02/20/2021, 2:22 PM   Clinical Narrative:   Patient who is stable for discharge will return to [place] today. Family alerted.  PTAR arranged.  Nursing, please call report to [phone]. TOC sign off.    Final next level of care: Skilled Nursing Facility Barriers to Discharge: Barriers Resolved   Patient Goals and CMS Choice Patient states their goals for this hospitalization and ongoing recovery are:: Patient wants a different facility   Choice offered to / list presented to : Adult Children  Discharge Placement                       Discharge Plan and Services   Discharge Planning Services: CM Consult Post Acute Care Choice: Skilled Nursing Facility                               Social Determinants of Health (SDOH) Interventions     Readmission Risk Interventions No flowsheet data found.

## 2021-02-20 NOTE — Discharge Summary (Signed)
Physician Discharge Summary  [name] [name] KCL:[id number] DOB: 30-Mar-1946 DOA: 02/15/2021  PCP: Hoyt Koch, MD  Admit date: 02/15/2021 Discharge date: 02/20/2021  Admitted From: [place] Disposition:  SNF  Recommendations for Outpatient Follow-up:  Follow up with PCP in 1-2 weeks  Discharge Condition:Stable CODE STATUS:Full Diet recommendation: Diabetic   Brief/Interim Summary: 75 y.o. female with medical history significant of HTN, DVT on eliquis, wernicke's, DM2, chronic lymphedema, OA, polyarticular pseudogout. Presenting with fevers and body aches. Her symptoms started 4 days ago w/ general body aches and swelling in her hands. It made it difficult to grip things (especially with the left hand). She tried ibuprofen and APAP, but it did not help. Reported decreased appetite and some nausea. No other overt symptoms. Her facility became concerned and sent her to the ED for evaluation.  Of note, patient was last admitted 03/2020, and was diagnosed with polyarticular pseudogout of bilateral knees.  In the ED, patient was noted to be febrile 100.6 F, tachycardic.  Labs showed hypokalemia, leukocytosis.  Urine culture, blood culture were obtained.  Patient admitted for further management  Discharge Diagnoses:  Active Problems:   Sepsis ([place])   Protein-calorie malnutrition, severe  Sepsis likely 2/2 ?fungal UTI Vs ?pseudogout flare up in b/l hands On admission, noted to be febrile, tachycardic with leukocytosis Currently afebrile, with resolved leukocytosis UA unremarkable, but UC growing 80,000 yeast BC x2 NGTD CRP 29.7, ESR elevated Procalcitonin improved Chest x-ray unremarkable Bilateral hand x-ray showed severe OA, noted possible chondrocalcinosis around wrist Further inflammatory markers negative (ANA, ANCA) To complete fluconazole for fungal UTI Completed cefepime for 5 days Stopped vancomycin and Flagyl    Suspected Pseudogout flareup in  BUE Worse in L hand and wrist CRP, ESR elevated Bilateral hand x-ray showed severe OA, noted possible chondrocalcinosis around wrist ANA and ANCA neg Reports improvement with prednisone. To complete 2 more days of prednisone on d/c Cautious use of NSAIDs due to history of multiple duodenal ulcers in 2021   Hypokalemia Replace as needed   Iron def anemia Hemoglobin currently around baseline Denies any recent bleeding Anemia panel showed iron 10, sat 7, ferritin 292, folate 23, vitamin B12 >1000 Given IV iron X 2 days, then PO iron on d/c Retic panel done   Thrombocytosis Likely reactive Stable at this time   Hypertension Continue Lopressor   History of DVT Continue Eliquis No evidence of acute blood loss   GERD/Hx of multiple duodenal ulcers Noted EGD in 03/2020 Continue PPI twice daily, sucralfate Cautious use of NSAIDs   Constipation Earlier KUB showed moderate stool Strict bowel regimen   History of chronic lymphedema WOC consult Patient reports usually gets bilateral lower extremity wrapped   History of Warnicke's encephalopathy Continue daily thiamine   Deconditioned Has been in SNF for about a year now PT/OT with recs for SNF   Obesity Lifestyle modification advised     Malnutrition Type:   Nutrition Problem: Severe Malnutrition Etiology: social / environmental circumstances     Malnutrition Characteristics:   Signs/Symptoms: severe muscle depletion, energy intake < or equal to 50% for > or equal to 1 month     Nutrition Interventions:   Interventions: Ensure Enlive (each supplement provides 350kcal and 20 grams of protein), Prostat     Estimated body mass index is 34.04 kg/m as calculated from the following:   Height as of this encounter: '5\' 11"'  (1.803 m).   Weight as of this encounter: 110.7 kg.     Discharge  Instructions   Allergies as of 02/20/2021       Reactions   Oxycodone Hcl Nausea And Vomiting   Penicillins Itching    Remote reaction, no anaphylaxis or severe cutaneous symptoms Tolerates Cefepime        Medication List     STOP taking these medications    collagenase ointment Commonly known as: SANTYL   Glucagon Emergency 1 MG Kit   ibuprofen 200 MG tablet Commonly known as: ADVIL   LACTOBACILLUS PO   metFORMIN 500 MG tablet Commonly known as: GLUCOPHAGE   metoCLOPramide 5 MG tablet Commonly known as: Reglan   ondansetron 4 MG disintegrating tablet Commonly known as: Zofran ODT   pantoprazole 40 MG tablet Commonly known as: PROTONIX   traMADol 50 MG tablet Commonly known as: ULTRAM       TAKE these medications    acetaminophen 500 MG tablet Commonly known as: TYLENOL Take 1,000 mg by mouth every 8 (eight) hours as needed for mild pain or moderate pain.   albuterol 108 (90 Base) MCG/ACT inhaler Commonly known as: VENTOLIN HFA Inhale 2 puffs into the lungs every 6 (six) hours as needed for wheezing or shortness of breath.   apixaban 2.5 MG Tabs tablet Commonly known as: ELIQUIS Take by mouth 2 (two) times daily.   benzocaine 10 % mucosal gel Commonly known as: ORAJEL Use as directed 1 application in the mouth or throat every 6 (six) hours as needed (gum pain/irritation).   diclofenac Sodium 1 % Gel Commonly known as: VOLTAREN Apply 4 g topically 4 (four) times daily. Apply to lower back   feeding supplement (ENSURE COMPLETE) Liqd Take 237 mLs by mouth 2 (two) times daily between meals. What changed: Another medication with the same name was removed. Continue taking this medication, and follow the directions you see here.   ferrous sulfate 325 (65 FE) MG tablet Take 1 tablet (325 mg total) by mouth daily with breakfast. Start taking on: February 21, 2021   fluconazole 50 MG tablet Commonly known as: DIFLUCAN Take 2 tablets (100 mg total) by mouth daily for 2 days.   folic acid 1 MG tablet Commonly known as: FOLVITE Take 1 mg by mouth daily.   glipiZIDE 2.5  MG 24 hr tablet Commonly known as: GLUCOTROL XL Take 2.5 mg by mouth daily.   ketotifen 0.025 % ophthalmic solution Commonly known as: ZADITOR Place 1 drop into both eyes daily. For glaucoma What changed: Another medication with the same name was removed. Continue taking this medication, and follow the directions you see here.   loperamide 2 MG capsule Commonly known as: IMODIUM Take 2 mg by mouth every 6 (six) hours as needed for diarrhea or loose stools.   metoprolol tartrate 25 MG tablet Commonly known as: LOPRESSOR Take 1 tablet (25 mg total) by mouth 2 (two) times daily. What changed: when to take this   montelukast 10 MG tablet Commonly known as: SINGULAIR Take 1 tablet (10 mg total) by mouth at bedtime.   multivitamin with minerals Tabs tablet Take 1 tablet by mouth daily.   omeprazole 20 MG capsule Commonly known as: PRILOSEC Take 20 mg by mouth 2 (two) times daily.   ondansetron 4 MG tablet Commonly known as: ZOFRAN Take 4 mg by mouth every 8 (eight) hours as needed for nausea or vomiting.   oxyCODONE 5 MG immediate release tablet Commonly known as: Oxy IR/ROXICODONE Take 1 tablet (5 mg total) by mouth every 6 (six) hours as needed for  moderate pain.   predniSONE 20 MG tablet Commonly known as: DELTASONE Take 2 tablets (40 mg total) by mouth daily with breakfast for 2 days. Start taking on: February 21, 2021   promethazine 25 MG suppository Commonly known as: Phenergan Place 1 suppository (25 mg total) rectally every 6 (six) hours as needed for nausea or vomiting.   sertraline 25 MG tablet Commonly known as: ZOLOFT Take 25 mg by mouth daily.   sodium chloride 0.65 % Soln nasal spray Commonly known as: OCEAN Place 1 spray into both nostrils every 2 (two) hours as needed for congestion (nasal dryness).   sucralfate 1 g tablet Commonly known as: CARAFATE Take 1 g by mouth 4 (four) times daily. Dissolve tablets What changed: Another medication with the  same name was removed. Continue taking this medication, and follow the directions you see here.   SYSTANE COMPLETE OP Apply 1 drop to eye 2 (two) times daily.   thiamine 100 MG tablet Take 1 tablet (100 mg total) by mouth daily.        Follow-up Information     Hoyt Koch, MD Follow up in 2 week(s).   Specialty: Internal Medicine Why: Hospital follow up Contact information: [place] [address] [phone]                Allergies  Allergen Reactions   Oxycodone Hcl Nausea And Vomiting   Penicillins Itching    Remote reaction, no anaphylaxis or severe cutaneous symptoms Tolerates Cefepime    Procedures/Studies: DG Abd 1 View  Result Date: 02/15/2021 CLINICAL DATA:  Abdominal and bilateral groin pain EXAM: ABDOMEN - 1 VIEW COMPARISON:  02/23/2020 FINDINGS: Moderate stool in the proximal colon. Bowel gas pattern is overall normal. No concerning mass effect or calcification. Lucency at the right lung bases attributed atelectasis based on preceding chest x-ray. IMPRESSION: Nonobstructive bowel gas pattern with moderate stool volume. Electronically Signed   By: Jorje Guild M.D.   On: 02/15/2021 10:11   DG Hand 2 View Left  Result Date: 02/16/2021 CLINICAL DATA:  Pain and swelling EXAM: LEFT HAND - 2 VIEW COMPARISON:  None. FINDINGS: No recent fracture or dislocation is seen. There are smooth marginated calcifications adjacent to first carpometacarpal joint and second metacarpophalangeal joint. Severe degenerative changes are noted in first carpometacarpal joint. Degenerative changes are noted with bony spurs in the interphalangeal joints, more severe in the distal interphalangeal joints of the index and middle fingers. There are minimal bony spurs in multiple metacarpophalangeal joints. There are amorphous calcifications in the soft tissues in the medial aspect of the wrist, possibly chondrocalcinosis. There is soft tissue swelling over the  dorsum. IMPRESSION: No recent fracture or dislocation is seen.There are no focal lytic lesions. Severe osteoarthritis. Degenerative changes are noted in multiple joints in the left hand, particularly severe in the first carpometacarpal joint, distal interphalangeal joint of the index finger and distal interphalangeal joint of the middle finger. Electronically Signed   By: Elmer Picker M.D.   On: 02/16/2021 11:19   DG Chest Port 1 View  Result Date: 02/15/2021 CLINICAL DATA:  Sepsis EXAM: PORTABLE CHEST 1 VIEW COMPARISON:  03/28/2020 FINDINGS: The heart size and mediastinal contours are within normal limits. Both lungs are clear save for minimal right basilar atelectasis. The visualized skeletal structures are unremarkable. IMPRESSION: No active disease. Electronically Signed   By: Fidela Salisbury M.D.   On: 02/15/2021 01:52    Subjective: Eager to participate in rehab  Discharge  Exam: Vitals:   02/20/21 0511 02/20/21 1342  BP: 123/67 122/72  Pulse: 71 66  Resp: 19 16  Temp: 98.2 F (36.8 C) 98.9 F (37.2 C)  SpO2: 96% 93%   Vitals:   02/19/21 1300 02/19/21 2203 02/20/21 0511 02/20/21 1342  BP:  131/71 123/67 122/72  Pulse:  84 71 66  Resp:  '18 19 16  ' Temp: 97.9 F (36.6 C) 98.8 F (37.1 C) 98.2 F (36.8 C) 98.9 F (37.2 C)  TempSrc:  Oral  Oral  SpO2:  97% 96% 93%  Weight:      Height:        General: Pt is alert, awake, not in acute distress Cardiovascular: RRR, S1/S2 + Respiratory: CTA bilaterally, no wheezing, no rhonchi Abdominal: Soft, NT, ND, bowel sounds + Extremities: no edema, no cyanosis   The results of significant diagnostics from this hospitalization (including imaging, microbiology, ancillary and laboratory) are listed below for reference.     Microbiology: Recent Results (from the past 240 hour(s))  Resp Panel by RT-PCR (Flu A&B, Covid) Nasopharyngeal Swab     Status: None   Collection Time: 02/15/21  1:41 AM   Specimen: Nasopharyngeal Swab;  Nasopharyngeal(NP) swabs in vial transport medium  Result Value Ref Range Status   SARS Coronavirus 2 by RT PCR NEGATIVE NEGATIVE Final    Comment: (NOTE) SARS-CoV-2 target nucleic acids are NOT DETECTED.  The SARS-CoV-2 RNA is generally detectable in upper respiratory specimens during the acute phase of infection. The lowest concentration of SARS-CoV-2 viral copies this assay can detect is 138 copies/mL. A negative result does not preclude SARS-Cov-2 infection and should not be used as the sole basis for treatment or other patient management decisions. A negative result may occur with  improper specimen collection/handling, submission of specimen other than nasopharyngeal swab, presence of viral mutation(s) within the areas targeted by this assay, and inadequate number of viral copies(<138 copies/mL). A negative result must be combined with clinical observations, patient history, and epidemiological information. The expected result is Negative.  Fact Sheet for Patients:  EntrepreneurPulse.com.au  Fact Sheet for Healthcare Providers:  IncredibleEmployment.be  This test is no t yet approved or cleared by the [place] FDA and  has been authorized for detection and/or diagnosis of SARS-CoV-2 by FDA under an Emergency Use Authorization (EUA). This EUA will remain  in effect (meaning this test can be used) for the duration of the COVID-19 declaration under Section 564(b)(1) of the Act, 21 U.S.C.section 360bbb-3(b)(1), unless the authorization is terminated  or revoked sooner.       Influenza A by PCR NEGATIVE NEGATIVE Final   Influenza B by PCR NEGATIVE NEGATIVE Final    Comment: (NOTE) The Xpert Xpress SARS-CoV-2/FLU/RSV plus assay is intended as an aid in the diagnosis of influenza from Nasopharyngeal swab specimens and should not be used as a sole basis for treatment. Nasal washings and aspirates are unacceptable for Xpert Xpress  SARS-CoV-2/FLU/RSV testing.  Fact Sheet for Patients: EntrepreneurPulse.com.au  Fact Sheet for Healthcare Providers: IncredibleEmployment.be  This test is not yet approved or cleared by the [place] FDA and has been authorized for detection and/or diagnosis of SARS-CoV-2 by FDA under an Emergency Use Authorization (EUA). This EUA will remain in effect (meaning this test can be used) for the duration of the COVID-19 declaration under Section 564(b)(1) of the Act, 21 U.S.C. section 360bbb-3(b)(1), unless the authorization is terminated or revoked.  Performed at [facility], [place] [address] [place], [place]  [address]   Blood Culture (routine x 2)     Status: None (Preliminary result)   Collection Time: 02/15/21  1:42 AM   Specimen: BLOOD  Result Value Ref Range Status   Specimen Description   Final    BLOOD BLOOD RIGHT HAND Performed at [facility] [address], [place], [place] [address]    Special Requests   Final    BOTTLES DRAWN AEROBIC AND ANAEROBIC Blood Culture results may not be optimal due to an excessive volume of blood received in culture bottles Performed at [facility] [address] [place], [place] [address]    Culture   Final    NO GROWTH 4 DAYS Performed at [facility], [place] [address], [place], [place] [address]    Report Status PENDING  Incomplete  Blood Culture (routine x 2)     Status: None (Preliminary result)   Collection Time: 02/15/21  1:42 AM   Specimen: BLOOD  Result Value Ref Range Status   Specimen Description   Final    BLOOD RIGHT WRIST Performed at [facility] [address] [place], [place] [address]    Special Requests   Final    BOTTLES DRAWN AEROBIC AND ANAEROBIC Blood Culture adequate volume Performed at [facility] [address] [place], [place] [address]    Culture   Final    NO  GROWTH 4 DAYS Performed at [facility], [place] [address] [place], [place] [address]    Report Status PENDING  Incomplete  Urine Culture     Status: Abnormal   Collection Time: 02/15/21  3:56 AM   Specimen: In/Out Cath Urine  Result Value Ref Range Status   Specimen Description   Final    IN/OUT CATH URINE Performed at [facility] [address], [place], [place] [address]    Special Requests   Final    Normal Performed at [facility], [place] [address], [place], [place] [address]    Culture 80,000 COLONIES/mL YEAST (A)  Final   Report Status 02/16/2021 FINAL  Final  Resp Panel by RT-PCR (Flu A&B, Covid) Nasopharyngeal Swab     Status: None   Collection Time: 02/20/21  9:11 AM   Specimen: Nasopharyngeal Swab; Nasopharyngeal(NP) swabs in vial transport medium  Result Value Ref Range Status   SARS Coronavirus 2 by RT PCR NEGATIVE NEGATIVE Final    Comment: (NOTE) SARS-CoV-2 target nucleic acids are NOT DETECTED.  The SARS-CoV-2 RNA is generally detectable in upper respiratory specimens during the acute phase of infection. The lowest concentration of SARS-CoV-2 viral copies this assay can detect is 138 copies/mL. A negative result does not preclude SARS-Cov-2 infection and should not be used as the sole basis for treatment or other patient management decisions. A negative result may occur with  improper specimen collection/handling, submission of specimen other than nasopharyngeal swab, presence of viral mutation(s) within the areas targeted by this assay, and inadequate number of viral copies(<138 copies/mL). A negative result must be combined with clinical observations, patient history, and epidemiological information. The expected result is Negative.  Fact Sheet for Patients:  EntrepreneurPulse.com.au  Fact Sheet for Healthcare Providers:  IncredibleEmployment.be  This test is no t yet approved  or cleared by the [place] FDA and  has been authorized for detection and/or diagnosis of SARS-CoV-2 by FDA under an Emergency Use Authorization (EUA). This EUA will remain  in effect (meaning this  test can be used) for the duration of the COVID-19 declaration under Section 564(b)(1) of the Act, 21 U.S.C.section 360bbb-3(b)(1), unless the authorization is terminated  or revoked sooner.       Influenza A by PCR NEGATIVE NEGATIVE Final   Influenza B by PCR NEGATIVE NEGATIVE Final    Comment: (NOTE) The Xpert Xpress SARS-CoV-2/FLU/RSV plus assay is intended as an aid in the diagnosis of influenza from Nasopharyngeal swab specimens and should not be used as a sole basis for treatment. Nasal washings and aspirates are unacceptable for Xpert Xpress SARS-CoV-2/FLU/RSV testing.  Fact Sheet for Patients: EntrepreneurPulse.com.au  Fact Sheet for Healthcare Providers: IncredibleEmployment.be  This test is not yet approved or cleared by the [place] FDA and has been authorized for detection and/or diagnosis of SARS-CoV-2 by FDA under an Emergency Use Authorization (EUA). This EUA will remain in effect (meaning this test can be used) for the duration of the COVID-19 declaration under Section 564(b)(1) of the Act, 21 U.S.C. section 360bbb-3(b)(1), unless the authorization is terminated or revoked.  Performed at [facility], [place] [address], [place], [place] [address]      Labs: BNP (last 3 results) Recent Labs    02/15/21 1033  BNP 179.1*   Basic Metabolic Panel: Recent Labs  Lab 02/15/21 0833 02/15/21 1033 02/15/21 1922 02/16/21 0543 02/17/21 0505 02/18/21 0505 02/19/21 0500 02/20/21 0500  NA  --  139 139 140 141 142 142 142  K  --  2.5* 2.9* 2.7* 3.2* 3.8 3.8 3.3*  CL  --  100 103 105 108 108 107 109  CO2  --  '27 26 29 26 26 26 26  ' GLUCOSE  --  123* 149* 115* 122* 101* 159* 160*  BUN  --  '12 14 16 15 14  17 23  ' CREATININE  --  0.82 0.68 0.63 0.61 0.69 0.69 0.74  CALCIUM  --  8.8* 8.7* 8.8* 8.7* 8.9 9.1 8.9  MG 1.9  --   --   --  2.0  --   --   --   PHOS  --  3.1 2.7  --   --   --   --   --    Liver Function Tests: Recent Labs  Lab 02/15/21 0142 02/15/21 1033 02/15/21 1922 02/16/21 0543  AST 30  --   --  16  ALT 21  --   --  17  ALKPHOS 95  --   --  82  BILITOT 1.0  --   --  0.6  PROT 6.9  --   --  6.4*  ALBUMIN 2.4* 2.3* 2.5* 2.2*   No results for input(s): LIPASE, AMYLASE in the last 168 hours. No results for input(s): AMMONIA in the last 168 hours. CBC: Recent Labs  Lab 02/15/21 0142 02/16/21 0543 02/17/21 0505 02/18/21 0505 02/19/21 0500 02/20/21 0500 02/20/21 1216  WBC 12.3* 9.1 9.5 8.2 10.7* 10.0  --   NEUTROABS 8.9*  --  7.1 5.6 9.1* 6.8  --   HGB 9.0* 8.5* 8.5* 8.0* 7.9* 7.6* 7.9*  HCT 28.8* 28.0* 27.7* 27.1* 26.3* 25.8* 26.6*  MCV 72.7* 73.1* 73.1* 75.1* 74.1* 76.6*  --   PLT 510* 525* 556* 568* 596* 611*  --    Cardiac Enzymes: No results for input(s): CKTOTAL, CKMB, CKMBINDEX, TROPONINI in the last 168 hours. BNP: Invalid input(s): POCBNP CBG: Recent Labs  Lab 02/19/21 1137 02/19/21 1709 02/19/21 2153 02/20/21 0805 02/20/21 1232  GLUCAP 157* 215* 227* 116*  152*   D-Dimer No results for input(s): DDIMER in the last 72 hours. Hgb A1c No results for input(s): HGBA1C in the last 72 hours. Lipid Profile No results for input(s): CHOL, HDL, LDLCALC, TRIG, CHOLHDL, LDLDIRECT in the last 72 hours. Thyroid function studies No results for input(s): TSH, T4TOTAL, T3FREE, THYROIDAB in the last 72 hours.  Invalid input(s): FREET3 Anemia work up No results for input(s): VITAMINB12, FOLATE, FERRITIN, TIBC, IRON, RETICCTPCT in the last 72 hours. Urinalysis    Component Value Date/Time   COLORURINE AMBER (A) 02/15/2021 0356   APPEARANCEUR HAZY (A) 02/15/2021 0356   LABSPEC 1.033 (H) 02/15/2021 0356   PHURINE 5.0 02/15/2021 0356   GLUCOSEU NEGATIVE  02/15/2021 0356   GLUCOSEU NEGATIVE 02/02/2020 1220   HGBUR MODERATE (A) 02/15/2021 0356   BILIRUBINUR SMALL (A) 02/15/2021 0356   BILIRUBINUR Neg 10/22/2017 0950   KETONESUR 20 (A) 02/15/2021 0356   PROTEINUR 100 (A) 02/15/2021 0356   UROBILINOGEN 0.2 02/02/2020 1220   NITRITE NEGATIVE 02/15/2021 0356   LEUKOCYTESUR TRACE (A) 02/15/2021 0356   Sepsis Labs Invalid input(s): PROCALCITONIN,  WBC,  LACTICIDVEN Microbiology Recent Results (from the past 240 hour(s))  Resp Panel by RT-PCR (Flu A&B, Covid) Nasopharyngeal Swab     Status: None   Collection Time: 02/15/21  1:41 AM   Specimen: Nasopharyngeal Swab; Nasopharyngeal(NP) swabs in vial transport medium  Result Value Ref Range Status   SARS Coronavirus 2 by RT PCR NEGATIVE NEGATIVE Final    Comment: (NOTE) SARS-CoV-2 target nucleic acids are NOT DETECTED.  The SARS-CoV-2 RNA is generally detectable in upper respiratory specimens during the acute phase of infection. The lowest concentration of SARS-CoV-2 viral copies this assay can detect is 138 copies/mL. A negative result does not preclude SARS-Cov-2 infection and should not be used as the sole basis for treatment or other patient management decisions. A negative result may occur with  improper specimen collection/handling, submission of specimen other than nasopharyngeal swab, presence of viral mutation(s) within the areas targeted by this assay, and inadequate number of viral copies(<138 copies/mL). A negative result must be combined with clinical observations, patient history, and epidemiological information. The expected result is Negative.  Fact Sheet for Patients:  EntrepreneurPulse.com.au  Fact Sheet for Healthcare Providers:  IncredibleEmployment.be  This test is no t yet approved or cleared by the [place] FDA and  has been authorized for detection and/or diagnosis of SARS-CoV-2 by FDA under an Emergency Use Authorization  (EUA). This EUA will remain  in effect (meaning this test can be used) for the duration of the COVID-19 declaration under Section 564(b)(1) of the Act, 21 U.S.C.section 360bbb-3(b)(1), unless the authorization is terminated  or revoked sooner.       Influenza A by PCR NEGATIVE NEGATIVE Final   Influenza B by PCR NEGATIVE NEGATIVE Final    Comment: (NOTE) The Xpert Xpress SARS-CoV-2/FLU/RSV plus assay is intended as an aid in the diagnosis of influenza from Nasopharyngeal swab specimens and should not be used as a sole basis for treatment. Nasal washings and aspirates are unacceptable for Xpert Xpress SARS-CoV-2/FLU/RSV testing.  Fact Sheet for Patients: EntrepreneurPulse.com.au  Fact Sheet for Healthcare Providers: IncredibleEmployment.be  This test is not yet approved or cleared by the [place] FDA and has been authorized for detection and/or diagnosis of SARS-CoV-2 by FDA under an Emergency Use Authorization (EUA). This EUA will remain in effect (meaning this test can be used) for the duration of the COVID-19 declaration under Section 564(b)(1) of the Act,  21 U.S.C. section 360bbb-3(b)(1), unless the authorization is terminated or revoked.  Performed at [facility], [place] [address], [place], [place] [address]   Blood Culture (routine x 2)     Status: None (Preliminary result)   Collection Time: 02/15/21  1:42 AM   Specimen: BLOOD  Result Value Ref Range Status   Specimen Description   Final    BLOOD BLOOD RIGHT HAND Performed at [facility] [address] [place], [place] [address]    Special Requests   Final    BOTTLES DRAWN AEROBIC AND ANAEROBIC Blood Culture results may not be optimal due to an excessive volume of blood received in culture bottles Performed at [facility] [address], [place], [place] [address]    Culture   Final    NO GROWTH 4 DAYS Performed  at [facility], [place] [address], [place], [place] [address]    Report Status PENDING  Incomplete  Blood Culture (routine x 2)     Status: None (Preliminary result)   Collection Time: 02/15/21  1:42 AM   Specimen: BLOOD  Result Value Ref Range Status   Specimen Description   Final    BLOOD RIGHT WRIST Performed at [facility] [address], [place], [place] [address]    Special Requests   Final    BOTTLES DRAWN AEROBIC AND ANAEROBIC Blood Culture adequate volume Performed at [facility] [address], [place], [place] [address]    Culture   Final    NO GROWTH 4 DAYS Performed at [facility], [place] [address], [place], [place] [address]    Report Status PENDING  Incomplete  Urine Culture     Status: Abnormal   Collection Time: 02/15/21  3:56 AM   Specimen: In/Out Cath Urine  Result Value Ref Range Status   Specimen Description   Final    IN/OUT CATH URINE Performed at [facility] [address], [place], [place] [address]    Special Requests   Final    Normal Performed at [facility], [place] [address] [place], [place] [address]    Culture 80,000 COLONIES/mL YEAST (A)  Final   Report Status 02/16/2021 FINAL  Final  Resp Panel by RT-PCR (Flu A&B, Covid) Nasopharyngeal Swab     Status: None   Collection Time: 02/20/21  9:11 AM   Specimen: Nasopharyngeal Swab; Nasopharyngeal(NP) swabs in vial transport medium  Result Value Ref Range Status   SARS Coronavirus 2 by RT PCR NEGATIVE NEGATIVE Final    Comment: (NOTE) SARS-CoV-2 target nucleic acids are NOT DETECTED.  The SARS-CoV-2 RNA is generally detectable in upper respiratory specimens during the acute phase of infection. The lowest concentration of SARS-CoV-2 viral copies this assay can detect is 138 copies/mL. A negative result does not preclude SARS-Cov-2 infection and should not be used as the sole basis for treatment or other  patient management decisions. A negative result may occur with  improper specimen collection/handling, submission of specimen other than nasopharyngeal swab, presence of viral mutation(s) within the areas targeted by this assay, and inadequate number of viral copies(<138 copies/mL). A negative result must be combined with clinical observations, patient history, and epidemiological information. The expected result is Negative.  Fact Sheet for Patients:  EntrepreneurPulse.com.au  Fact Sheet for Healthcare Providers:  IncredibleEmployment.be  This test is no t yet approved or cleared by the [place] FDA and  has been  authorized for detection and/or diagnosis of SARS-CoV-2 by FDA under an Emergency Use Authorization (EUA). This EUA will remain  in effect (meaning this test can be used) for the duration of the COVID-19 declaration under Section 564(b)(1) of the Act, 21 U.S.C.section 360bbb-3(b)(1), unless the authorization is terminated  or revoked sooner.       Influenza A by PCR NEGATIVE NEGATIVE Final   Influenza B by PCR NEGATIVE NEGATIVE Final    Comment: (NOTE) The Xpert Xpress SARS-CoV-2/FLU/RSV plus assay is intended as an aid in the diagnosis of influenza from Nasopharyngeal swab specimens and should not be used as a sole basis for treatment. Nasal washings and aspirates are unacceptable for Xpert Xpress SARS-CoV-2/FLU/RSV testing.  Fact Sheet for Patients: EntrepreneurPulse.com.au  Fact Sheet for Healthcare Providers: IncredibleEmployment.be  This test is not yet approved or cleared by the [place] FDA and has been authorized for detection and/or diagnosis of SARS-CoV-2 by FDA under an Emergency Use Authorization (EUA). This EUA will remain in effect (meaning this test can be used) for the duration of the COVID-19 declaration under Section 564(b)(1) of the Act, 21 U.S.C. section  360bbb-3(b)(1), unless the authorization is terminated or revoked.  Performed at [facility], [place] [address], [place], [place] [address]    Time spent: 30 min  SIGNED:   Marylu Lund, MD  [facility] 02/20/2021, 1:57 PM  If 7PM-7AM, please contact night-coverage

## 2021-02-21 DIAGNOSIS — R419 Unspecified symptoms and signs involving cognitive functions and awareness: Secondary | ICD-10-CM | POA: Diagnosis not present

## 2021-02-21 DIAGNOSIS — F331 Major depressive disorder, recurrent, moderate: Secondary | ICD-10-CM | POA: Diagnosis not present

## 2021-02-22 NOTE — Progress Notes (Signed)
Informed by previous  shift RN that attempt made  to call report to [facility] with no success made. Writer also made attempt. Transportation made aware

## 2021-02-24 DIAGNOSIS — D509 Iron deficiency anemia, unspecified: Secondary | ICD-10-CM | POA: Diagnosis not present

## 2021-02-24 DIAGNOSIS — E512 Wernicke's encephalopathy: Secondary | ICD-10-CM | POA: Diagnosis not present

## 2021-02-24 DIAGNOSIS — A419 Sepsis, unspecified organism: Secondary | ICD-10-CM | POA: Diagnosis not present

## 2021-02-24 DIAGNOSIS — N390 Urinary tract infection, site not specified: Secondary | ICD-10-CM | POA: Diagnosis not present

## 2021-02-24 DIAGNOSIS — I890 Lymphedema, not elsewhere classified: Secondary | ICD-10-CM | POA: Diagnosis not present

## 2021-02-24 DIAGNOSIS — E876 Hypokalemia: Secondary | ICD-10-CM | POA: Diagnosis not present

## 2021-02-24 DIAGNOSIS — M11249 Other chondrocalcinosis, unspecified hand: Secondary | ICD-10-CM | POA: Diagnosis not present

## 2021-02-24 DIAGNOSIS — E43 Unspecified severe protein-calorie malnutrition: Secondary | ICD-10-CM | POA: Diagnosis not present

## 2021-03-11 DIAGNOSIS — H11823 Conjunctivochalasis, bilateral: Secondary | ICD-10-CM | POA: Diagnosis not present

## 2021-03-11 DIAGNOSIS — H25813 Combined forms of age-related cataract, bilateral: Secondary | ICD-10-CM | POA: Diagnosis not present

## 2021-03-11 DIAGNOSIS — H16223 Keratoconjunctivitis sicca, not specified as Sjogren's, bilateral: Secondary | ICD-10-CM | POA: Diagnosis not present

## 2021-03-11 DIAGNOSIS — H5203 Hypermetropia, bilateral: Secondary | ICD-10-CM | POA: Diagnosis not present

## 2021-03-11 DIAGNOSIS — H1045 Other chronic allergic conjunctivitis: Secondary | ICD-10-CM | POA: Diagnosis not present

## 2021-03-11 DIAGNOSIS — E119 Type 2 diabetes mellitus without complications: Secondary | ICD-10-CM | POA: Diagnosis not present

## 2021-03-11 DIAGNOSIS — H353131 Nonexudative age-related macular degeneration, bilateral, early dry stage: Secondary | ICD-10-CM | POA: Diagnosis not present

## 2021-03-20 DIAGNOSIS — H547 Unspecified visual loss: Secondary | ICD-10-CM | POA: Diagnosis not present

## 2021-03-20 DIAGNOSIS — M1990 Unspecified osteoarthritis, unspecified site: Secondary | ICD-10-CM | POA: Diagnosis not present

## 2021-03-20 DIAGNOSIS — E119 Type 2 diabetes mellitus without complications: Secondary | ICD-10-CM | POA: Diagnosis not present

## 2021-03-20 DIAGNOSIS — E512 Wernicke's encephalopathy: Secondary | ICD-10-CM | POA: Diagnosis not present

## 2021-03-20 DIAGNOSIS — M11249 Other chondrocalcinosis, unspecified hand: Secondary | ICD-10-CM | POA: Diagnosis not present

## 2021-03-20 DIAGNOSIS — D509 Iron deficiency anemia, unspecified: Secondary | ICD-10-CM | POA: Diagnosis not present

## 2021-03-20 DIAGNOSIS — I10 Essential (primary) hypertension: Secondary | ICD-10-CM | POA: Diagnosis not present

## 2021-03-20 DIAGNOSIS — N1831 Chronic kidney disease, stage 3a: Secondary | ICD-10-CM | POA: Diagnosis not present

## 2021-03-25 DIAGNOSIS — R419 Unspecified symptoms and signs involving cognitive functions and awareness: Secondary | ICD-10-CM | POA: Diagnosis not present

## 2021-03-25 DIAGNOSIS — F331 Major depressive disorder, recurrent, moderate: Secondary | ICD-10-CM | POA: Diagnosis not present

## 2021-04-01 DIAGNOSIS — G8929 Other chronic pain: Secondary | ICD-10-CM | POA: Diagnosis not present

## 2021-04-01 DIAGNOSIS — I890 Lymphedema, not elsewhere classified: Secondary | ICD-10-CM | POA: Diagnosis not present

## 2021-04-01 DIAGNOSIS — D649 Anemia, unspecified: Secondary | ICD-10-CM | POA: Diagnosis not present

## 2021-04-01 DIAGNOSIS — I10 Essential (primary) hypertension: Secondary | ICD-10-CM | POA: Diagnosis not present

## 2021-04-01 DIAGNOSIS — F0390 Unspecified dementia without behavioral disturbance: Secondary | ICD-10-CM | POA: Diagnosis not present

## 2021-04-28 DIAGNOSIS — F01A3 Vascular dementia, mild, with mood disturbance: Secondary | ICD-10-CM | POA: Diagnosis not present

## 2021-04-28 DIAGNOSIS — I69815 Cognitive social or emotional deficit following other cerebrovascular disease: Secondary | ICD-10-CM | POA: Diagnosis not present

## 2021-04-28 DIAGNOSIS — F331 Major depressive disorder, recurrent, moderate: Secondary | ICD-10-CM | POA: Diagnosis not present

## 2021-05-05 DIAGNOSIS — E1159 Type 2 diabetes mellitus with other circulatory complications: Secondary | ICD-10-CM | POA: Diagnosis not present

## 2021-05-05 DIAGNOSIS — L603 Nail dystrophy: Secondary | ICD-10-CM | POA: Diagnosis not present

## 2021-05-05 DIAGNOSIS — B351 Tinea unguium: Secondary | ICD-10-CM | POA: Diagnosis not present

## 2021-05-20 DIAGNOSIS — F331 Major depressive disorder, recurrent, moderate: Secondary | ICD-10-CM | POA: Diagnosis not present

## 2021-05-20 DIAGNOSIS — I69815 Cognitive social or emotional deficit following other cerebrovascular disease: Secondary | ICD-10-CM | POA: Diagnosis not present

## 2021-05-20 DIAGNOSIS — F01A3 Vascular dementia, mild, with mood disturbance: Secondary | ICD-10-CM | POA: Diagnosis not present

## 2021-05-26 DIAGNOSIS — R42 Dizziness and giddiness: Secondary | ICD-10-CM | POA: Diagnosis not present

## 2021-05-26 DIAGNOSIS — E119 Type 2 diabetes mellitus without complications: Secondary | ICD-10-CM | POA: Diagnosis not present

## 2021-05-26 DIAGNOSIS — I10 Essential (primary) hypertension: Secondary | ICD-10-CM | POA: Diagnosis not present

## 2021-06-04 DIAGNOSIS — H903 Sensorineural hearing loss, bilateral: Secondary | ICD-10-CM | POA: Diagnosis not present

## 2021-06-04 DIAGNOSIS — H9313 Tinnitus, bilateral: Secondary | ICD-10-CM | POA: Diagnosis not present

## 2021-06-04 DIAGNOSIS — R2689 Other abnormalities of gait and mobility: Secondary | ICD-10-CM | POA: Diagnosis not present

## 2021-06-16 DIAGNOSIS — R42 Dizziness and giddiness: Secondary | ICD-10-CM | POA: Diagnosis not present

## 2021-06-16 DIAGNOSIS — F01A3 Vascular dementia, mild, with mood disturbance: Secondary | ICD-10-CM | POA: Diagnosis not present

## 2021-06-16 DIAGNOSIS — I69815 Cognitive social or emotional deficit following other cerebrovascular disease: Secondary | ICD-10-CM | POA: Diagnosis not present

## 2021-06-16 DIAGNOSIS — F331 Major depressive disorder, recurrent, moderate: Secondary | ICD-10-CM | POA: Diagnosis not present

## 2021-07-09 DIAGNOSIS — H90A21 Sensorineural hearing loss, unilateral, right ear, with restricted hearing on the contralateral side: Secondary | ICD-10-CM | POA: Diagnosis not present

## 2021-07-09 DIAGNOSIS — H90A32 Mixed conductive and sensorineural hearing loss, unilateral, left ear with restricted hearing on the contralateral side: Secondary | ICD-10-CM | POA: Diagnosis not present

## 2021-07-21 DIAGNOSIS — F331 Major depressive disorder, recurrent, moderate: Secondary | ICD-10-CM | POA: Diagnosis not present

## 2021-07-21 DIAGNOSIS — I129 Hypertensive chronic kidney disease with stage 1 through stage 4 chronic kidney disease, or unspecified chronic kidney disease: Secondary | ICD-10-CM | POA: Diagnosis not present

## 2021-07-21 DIAGNOSIS — F01A3 Vascular dementia, mild, with mood disturbance: Secondary | ICD-10-CM | POA: Diagnosis not present

## 2021-07-21 DIAGNOSIS — E1122 Type 2 diabetes mellitus with diabetic chronic kidney disease: Secondary | ICD-10-CM | POA: Diagnosis not present

## 2021-07-21 DIAGNOSIS — I69321 Dysphasia following cerebral infarction: Secondary | ICD-10-CM | POA: Diagnosis not present

## 2021-07-21 DIAGNOSIS — I69815 Cognitive social or emotional deficit following other cerebrovascular disease: Secondary | ICD-10-CM | POA: Diagnosis not present

## 2021-07-23 DIAGNOSIS — E1159 Type 2 diabetes mellitus with other circulatory complications: Secondary | ICD-10-CM | POA: Diagnosis not present

## 2021-07-23 DIAGNOSIS — B351 Tinea unguium: Secondary | ICD-10-CM | POA: Diagnosis not present

## 2021-07-24 DIAGNOSIS — R42 Dizziness and giddiness: Secondary | ICD-10-CM | POA: Diagnosis not present

## 2021-07-24 DIAGNOSIS — H903 Sensorineural hearing loss, bilateral: Secondary | ICD-10-CM | POA: Diagnosis not present

## 2021-07-25 DIAGNOSIS — E119 Type 2 diabetes mellitus without complications: Secondary | ICD-10-CM | POA: Diagnosis not present

## 2021-07-25 DIAGNOSIS — I10 Essential (primary) hypertension: Secondary | ICD-10-CM | POA: Diagnosis not present

## 2021-08-05 DIAGNOSIS — M6281 Muscle weakness (generalized): Secondary | ICD-10-CM | POA: Diagnosis not present

## 2021-08-05 DIAGNOSIS — R279 Unspecified lack of coordination: Secondary | ICD-10-CM | POA: Diagnosis not present

## 2021-08-05 DIAGNOSIS — G9341 Metabolic encephalopathy: Secondary | ICD-10-CM | POA: Diagnosis not present

## 2021-08-05 DIAGNOSIS — M2550 Pain in unspecified joint: Secondary | ICD-10-CM | POA: Diagnosis not present

## 2021-08-05 DIAGNOSIS — N189 Chronic kidney disease, unspecified: Secondary | ICD-10-CM | POA: Diagnosis not present

## 2021-08-18 DIAGNOSIS — R630 Anorexia: Secondary | ICD-10-CM | POA: Diagnosis not present

## 2021-08-18 DIAGNOSIS — R634 Abnormal weight loss: Secondary | ICD-10-CM | POA: Diagnosis not present

## 2021-08-18 DIAGNOSIS — F0390 Unspecified dementia without behavioral disturbance: Secondary | ICD-10-CM | POA: Diagnosis not present

## 2021-08-19 DIAGNOSIS — I69815 Cognitive social or emotional deficit following other cerebrovascular disease: Secondary | ICD-10-CM | POA: Diagnosis not present

## 2021-08-19 DIAGNOSIS — F331 Major depressive disorder, recurrent, moderate: Secondary | ICD-10-CM | POA: Diagnosis not present

## 2021-08-19 DIAGNOSIS — F01A3 Vascular dementia, mild, with mood disturbance: Secondary | ICD-10-CM | POA: Diagnosis not present

## 2021-08-21 DIAGNOSIS — I10 Essential (primary) hypertension: Secondary | ICD-10-CM | POA: Diagnosis not present

## 2021-08-25 DIAGNOSIS — N1831 Chronic kidney disease, stage 3a: Secondary | ICD-10-CM | POA: Diagnosis not present

## 2021-08-25 DIAGNOSIS — B372 Candidiasis of skin and nail: Secondary | ICD-10-CM | POA: Diagnosis not present

## 2021-08-25 DIAGNOSIS — D649 Anemia, unspecified: Secondary | ICD-10-CM | POA: Diagnosis not present

## 2021-08-25 DIAGNOSIS — R630 Anorexia: Secondary | ICD-10-CM | POA: Diagnosis not present

## 2021-09-04 DIAGNOSIS — N189 Chronic kidney disease, unspecified: Secondary | ICD-10-CM | POA: Diagnosis not present

## 2021-09-04 DIAGNOSIS — R279 Unspecified lack of coordination: Secondary | ICD-10-CM | POA: Diagnosis not present

## 2021-09-04 DIAGNOSIS — M6281 Muscle weakness (generalized): Secondary | ICD-10-CM | POA: Diagnosis not present

## 2021-09-04 DIAGNOSIS — S72142D Displaced intertrochanteric fracture of left femur, subsequent encounter for closed fracture with routine healing: Secondary | ICD-10-CM | POA: Diagnosis not present

## 2021-09-04 DIAGNOSIS — M2550 Pain in unspecified joint: Secondary | ICD-10-CM | POA: Diagnosis not present

## 2021-09-04 DIAGNOSIS — G9341 Metabolic encephalopathy: Secondary | ICD-10-CM | POA: Diagnosis not present

## 2021-09-08 DIAGNOSIS — G8929 Other chronic pain: Secondary | ICD-10-CM | POA: Diagnosis not present

## 2021-09-08 DIAGNOSIS — E119 Type 2 diabetes mellitus without complications: Secondary | ICD-10-CM | POA: Diagnosis not present

## 2021-09-08 DIAGNOSIS — I10 Essential (primary) hypertension: Secondary | ICD-10-CM | POA: Diagnosis not present

## 2021-09-08 DIAGNOSIS — I890 Lymphedema, not elsewhere classified: Secondary | ICD-10-CM | POA: Diagnosis not present

## 2021-09-16 DIAGNOSIS — F01A3 Vascular dementia, mild, with mood disturbance: Secondary | ICD-10-CM | POA: Diagnosis not present

## 2021-09-16 DIAGNOSIS — F331 Major depressive disorder, recurrent, moderate: Secondary | ICD-10-CM | POA: Diagnosis not present

## 2021-09-16 DIAGNOSIS — I69815 Cognitive social or emotional deficit following other cerebrovascular disease: Secondary | ICD-10-CM | POA: Diagnosis not present

## 2021-09-24 DIAGNOSIS — E11610 Type 2 diabetes mellitus with diabetic neuropathic arthropathy: Secondary | ICD-10-CM | POA: Diagnosis not present

## 2021-09-24 DIAGNOSIS — B351 Tinea unguium: Secondary | ICD-10-CM | POA: Diagnosis not present

## 2021-09-24 DIAGNOSIS — E1159 Type 2 diabetes mellitus with other circulatory complications: Secondary | ICD-10-CM | POA: Diagnosis not present

## 2021-10-04 DIAGNOSIS — N189 Chronic kidney disease, unspecified: Secondary | ICD-10-CM | POA: Diagnosis not present

## 2021-10-04 DIAGNOSIS — S82142G Displaced bicondylar fracture of left tibia, subsequent encounter for closed fracture with delayed healing: Secondary | ICD-10-CM | POA: Diagnosis not present

## 2021-10-04 DIAGNOSIS — M2550 Pain in unspecified joint: Secondary | ICD-10-CM | POA: Diagnosis not present

## 2021-10-04 DIAGNOSIS — G9341 Metabolic encephalopathy: Secondary | ICD-10-CM | POA: Diagnosis not present

## 2021-10-04 DIAGNOSIS — R279 Unspecified lack of coordination: Secondary | ICD-10-CM | POA: Diagnosis not present

## 2021-10-04 DIAGNOSIS — M6281 Muscle weakness (generalized): Secondary | ICD-10-CM | POA: Diagnosis not present

## 2021-10-04 DIAGNOSIS — S72142D Displaced intertrochanteric fracture of left femur, subsequent encounter for closed fracture with routine healing: Secondary | ICD-10-CM | POA: Diagnosis not present

## 2021-10-13 DIAGNOSIS — E119 Type 2 diabetes mellitus without complications: Secondary | ICD-10-CM | POA: Diagnosis not present

## 2021-10-13 DIAGNOSIS — R630 Anorexia: Secondary | ICD-10-CM | POA: Diagnosis not present

## 2021-10-14 DIAGNOSIS — I69815 Cognitive social or emotional deficit following other cerebrovascular disease: Secondary | ICD-10-CM | POA: Diagnosis not present

## 2021-10-14 DIAGNOSIS — F331 Major depressive disorder, recurrent, moderate: Secondary | ICD-10-CM | POA: Diagnosis not present

## 2021-10-14 DIAGNOSIS — F01A3 Vascular dementia, mild, with mood disturbance: Secondary | ICD-10-CM | POA: Diagnosis not present

## 2021-10-16 DIAGNOSIS — H04123 Dry eye syndrome of bilateral lacrimal glands: Secondary | ICD-10-CM | POA: Diagnosis not present

## 2021-10-16 DIAGNOSIS — H43813 Vitreous degeneration, bilateral: Secondary | ICD-10-CM | POA: Diagnosis not present

## 2021-10-16 DIAGNOSIS — H25813 Combined forms of age-related cataract, bilateral: Secondary | ICD-10-CM | POA: Diagnosis not present

## 2021-10-16 DIAGNOSIS — H524 Presbyopia: Secondary | ICD-10-CM | POA: Diagnosis not present

## 2021-10-16 DIAGNOSIS — E119 Type 2 diabetes mellitus without complications: Secondary | ICD-10-CM | POA: Diagnosis not present

## 2021-10-30 ENCOUNTER — Telehealth: Payer: Self-pay

## 2021-10-30 NOTE — Telephone Encounter (Signed)
Pt is calling in wanting to know if Dr. [name] knows what type of medical equipment she is needing once she leave the rehab she is in on Monday.  **Pt states she was not told what she needs to go home with or anything cause the facility has not told her anything and she does not know what to do.  Pt has asked that Dr. [name] look into this and please have a nurse casll her back with what she needs at [phone]

## 2021-10-31 NOTE — Telephone Encounter (Signed)
Spoke with the pt and she verbalized understanding recommendations from Dr. [name]. No other questions or concerns.

## 2021-10-31 NOTE — Telephone Encounter (Signed)
I have not seen patient in almost a year so do not know her current health state. I would recommend she talk to patient advocate/navigator at the nursing home.

## 2021-11-04 DIAGNOSIS — N189 Chronic kidney disease, unspecified: Secondary | ICD-10-CM | POA: Diagnosis not present

## 2021-11-04 DIAGNOSIS — U071 COVID-19: Secondary | ICD-10-CM | POA: Diagnosis not present

## 2021-11-04 DIAGNOSIS — G9341 Metabolic encephalopathy: Secondary | ICD-10-CM | POA: Diagnosis not present

## 2021-11-04 DIAGNOSIS — R279 Unspecified lack of coordination: Secondary | ICD-10-CM | POA: Diagnosis not present

## 2021-11-04 DIAGNOSIS — M2550 Pain in unspecified joint: Secondary | ICD-10-CM | POA: Diagnosis not present

## 2021-11-04 DIAGNOSIS — M6281 Muscle weakness (generalized): Secondary | ICD-10-CM | POA: Diagnosis not present

## 2021-11-04 DIAGNOSIS — J810 Acute pulmonary edema: Secondary | ICD-10-CM | POA: Diagnosis not present

## 2021-11-04 DIAGNOSIS — S82142G Displaced bicondylar fracture of left tibia, subsequent encounter for closed fracture with delayed healing: Secondary | ICD-10-CM | POA: Diagnosis not present

## 2021-11-04 DIAGNOSIS — S72142D Displaced intertrochanteric fracture of left femur, subsequent encounter for closed fracture with routine healing: Secondary | ICD-10-CM | POA: Diagnosis not present

## 2021-11-10 DIAGNOSIS — I10 Essential (primary) hypertension: Secondary | ICD-10-CM | POA: Diagnosis not present

## 2021-11-10 DIAGNOSIS — N1831 Chronic kidney disease, stage 3a: Secondary | ICD-10-CM | POA: Diagnosis not present

## 2021-11-10 DIAGNOSIS — I82409 Acute embolism and thrombosis of unspecified deep veins of unspecified lower extremity: Secondary | ICD-10-CM | POA: Diagnosis not present

## 2021-11-10 DIAGNOSIS — E119 Type 2 diabetes mellitus without complications: Secondary | ICD-10-CM | POA: Diagnosis not present

## 2021-11-10 DIAGNOSIS — I890 Lymphedema, not elsewhere classified: Secondary | ICD-10-CM | POA: Diagnosis not present

## 2021-11-10 DIAGNOSIS — M1990 Unspecified osteoarthritis, unspecified site: Secondary | ICD-10-CM | POA: Diagnosis not present

## 2021-11-11 DIAGNOSIS — F01A3 Vascular dementia, mild, with mood disturbance: Secondary | ICD-10-CM | POA: Diagnosis not present

## 2021-11-11 DIAGNOSIS — I69815 Cognitive social or emotional deficit following other cerebrovascular disease: Secondary | ICD-10-CM | POA: Diagnosis not present

## 2021-11-11 DIAGNOSIS — F423 Hoarding disorder: Secondary | ICD-10-CM | POA: Diagnosis not present

## 2021-11-11 DIAGNOSIS — F331 Major depressive disorder, recurrent, moderate: Secondary | ICD-10-CM | POA: Diagnosis not present

## 2021-11-17 DIAGNOSIS — R197 Diarrhea, unspecified: Secondary | ICD-10-CM | POA: Diagnosis not present

## 2021-11-17 DIAGNOSIS — M170 Bilateral primary osteoarthritis of knee: Secondary | ICD-10-CM | POA: Diagnosis not present

## 2021-11-17 DIAGNOSIS — G8929 Other chronic pain: Secondary | ICD-10-CM | POA: Diagnosis not present

## 2021-11-25 DIAGNOSIS — B351 Tinea unguium: Secondary | ICD-10-CM | POA: Diagnosis not present

## 2021-11-25 DIAGNOSIS — E1159 Type 2 diabetes mellitus with other circulatory complications: Secondary | ICD-10-CM | POA: Diagnosis not present

## 2021-12-06 DIAGNOSIS — S82142G Displaced bicondylar fracture of left tibia, subsequent encounter for closed fracture with delayed healing: Secondary | ICD-10-CM | POA: Diagnosis not present

## 2021-12-06 DIAGNOSIS — M6281 Muscle weakness (generalized): Secondary | ICD-10-CM | POA: Diagnosis not present

## 2021-12-06 DIAGNOSIS — S72142D Displaced intertrochanteric fracture of left femur, subsequent encounter for closed fracture with routine healing: Secondary | ICD-10-CM | POA: Diagnosis not present

## 2021-12-06 DIAGNOSIS — N189 Chronic kidney disease, unspecified: Secondary | ICD-10-CM | POA: Diagnosis not present

## 2021-12-06 DIAGNOSIS — G9341 Metabolic encephalopathy: Secondary | ICD-10-CM | POA: Diagnosis not present

## 2021-12-06 DIAGNOSIS — M2550 Pain in unspecified joint: Secondary | ICD-10-CM | POA: Diagnosis not present

## 2021-12-06 DIAGNOSIS — R279 Unspecified lack of coordination: Secondary | ICD-10-CM | POA: Diagnosis not present

## 2021-12-06 DIAGNOSIS — J810 Acute pulmonary edema: Secondary | ICD-10-CM | POA: Diagnosis not present

## 2021-12-06 DIAGNOSIS — U071 COVID-19: Secondary | ICD-10-CM | POA: Diagnosis not present

## 2021-12-12 DIAGNOSIS — F331 Major depressive disorder, recurrent, moderate: Secondary | ICD-10-CM | POA: Diagnosis not present

## 2021-12-12 DIAGNOSIS — I69815 Cognitive social or emotional deficit following other cerebrovascular disease: Secondary | ICD-10-CM | POA: Diagnosis not present

## 2021-12-12 DIAGNOSIS — E119 Type 2 diabetes mellitus without complications: Secondary | ICD-10-CM | POA: Diagnosis not present

## 2021-12-12 DIAGNOSIS — I10 Essential (primary) hypertension: Secondary | ICD-10-CM | POA: Diagnosis not present

## 2021-12-12 DIAGNOSIS — F423 Hoarding disorder: Secondary | ICD-10-CM | POA: Diagnosis not present

## 2021-12-12 DIAGNOSIS — F01A3 Vascular dementia, mild, with mood disturbance: Secondary | ICD-10-CM | POA: Diagnosis not present

## 2021-12-29 DIAGNOSIS — G8929 Other chronic pain: Secondary | ICD-10-CM | POA: Diagnosis not present

## 2021-12-29 DIAGNOSIS — R197 Diarrhea, unspecified: Secondary | ICD-10-CM | POA: Diagnosis not present

## 2022-01-12 DIAGNOSIS — F423 Hoarding disorder: Secondary | ICD-10-CM | POA: Diagnosis not present

## 2022-01-12 DIAGNOSIS — F01A3 Vascular dementia, mild, with mood disturbance: Secondary | ICD-10-CM | POA: Diagnosis not present

## 2022-01-12 DIAGNOSIS — I69815 Cognitive social or emotional deficit following other cerebrovascular disease: Secondary | ICD-10-CM | POA: Diagnosis not present

## 2022-01-12 DIAGNOSIS — F331 Major depressive disorder, recurrent, moderate: Secondary | ICD-10-CM | POA: Diagnosis not present

## 2022-01-20 DIAGNOSIS — R279 Unspecified lack of coordination: Secondary | ICD-10-CM | POA: Diagnosis not present

## 2022-01-20 DIAGNOSIS — G9341 Metabolic encephalopathy: Secondary | ICD-10-CM | POA: Diagnosis not present

## 2022-01-20 DIAGNOSIS — M6281 Muscle weakness (generalized): Secondary | ICD-10-CM | POA: Diagnosis not present

## 2022-01-20 DIAGNOSIS — M2550 Pain in unspecified joint: Secondary | ICD-10-CM | POA: Diagnosis not present

## 2022-01-27 DIAGNOSIS — F331 Major depressive disorder, recurrent, moderate: Secondary | ICD-10-CM | POA: Diagnosis not present

## 2022-02-04 DIAGNOSIS — R279 Unspecified lack of coordination: Secondary | ICD-10-CM | POA: Diagnosis not present

## 2022-02-04 DIAGNOSIS — G9341 Metabolic encephalopathy: Secondary | ICD-10-CM | POA: Diagnosis not present

## 2022-02-04 DIAGNOSIS — M6281 Muscle weakness (generalized): Secondary | ICD-10-CM | POA: Diagnosis not present

## 2022-02-04 DIAGNOSIS — F331 Major depressive disorder, recurrent, moderate: Secondary | ICD-10-CM | POA: Diagnosis not present

## 2022-02-04 DIAGNOSIS — M2550 Pain in unspecified joint: Secondary | ICD-10-CM | POA: Diagnosis not present

## 2022-02-09 ENCOUNTER — Ambulatory Visit: Payer: PPO | Admitting: Physician Assistant

## 2022-02-09 ENCOUNTER — Encounter: Payer: Self-pay | Admitting: Physician Assistant

## 2022-02-09 ENCOUNTER — Other Ambulatory Visit: Payer: PPO

## 2022-02-09 VITALS — BP 144/76 | HR 54 | Wt 234.0 lb

## 2022-02-09 DIAGNOSIS — R197 Diarrhea, unspecified: Secondary | ICD-10-CM

## 2022-02-09 NOTE — Progress Notes (Signed)
Chief Complaint: Diarrhea  HPI:    [name] [name] is a 76 year old African-American female, known to Dr. Carlean Purl, with a past medical history as listed below including CHF, heart murmur, diastolic dysfunction, SVT and multiple others, who presents to clinic today for diarrhea.    07/31/2014 colonoscopy was normal.  Repeat recommended with noninvasive testing in 10 years.    12/21/2017 EGD with Dr. Bryan Lemma with a mild Schatzki's ring dilated with a 20 mm balloon, normal upper third of the esophagus and middle third of the esophagus, gastritis, normal gastric fundus, body and pylorus and normal duodenal bulb, first portion of duodenum and second portion of the duodenum.  Pathology showed reactive gastropathy.     01/05/2018 office visit with me and at that time was following up after some nausea and was doing better on Reglan twice daily.  She was told to stop her Reglan and see how things went.  If they return would recommend gastric emptying study.    Today, patient presents to clinic and is a poor historian.  She tells me that for the past 2 years since arriving at her assisted living facility she has had diarrhea.  She describes 2-3 loose stools that feels like "I am urinating", when she passes them, for this reason she was put in depends and is changed about 3-4 times a day and tells me she always has liquid stool in there.  She cannot remember having a solid stool over the past 2 years.  Apparently on Imodium once daily which she tells me helps a little bit, but does not completely resolve the problem.  She feels like something else is going on.  Occasional abdominal cramping.    In general patient does not feel like she is told much about her health care at her new facility and does not know what is going on with her.  She would like to be involved in her care.    Denies fever, chills, blood in her stool or symptoms that awaken her from sleep.  Past Medical History:  Diagnosis Date   Acute kidney  injury ([facility])    Aortic stenosis, mild 11/17/2013   Arthritis    "both knees, spine, back, fingers" (11/29/2013)   CHF (congestive heart failure) (H[facility])    Edema    GERD (gastroesophageal reflux disease)    Heart murmur    History of diastolic dysfunction    Echo 08/5730 with diastolic dysfunction   HTN (hypertension)    Morbid obesity (HCC)    OA (osteoarthritis)    PAC (premature atrial contraction)    per prior Holter   Palpitations    Pneumonia    "as a child"   PVC's (premature ventricular contractions)    per prior Holter   SVT (supraventricular tachycardia) (HCC)    Tachycardia    noted at 07/28/11 visit. Started on beta blocker. Possible atrial flutter vs long PR tachycardia/AVNRT   Type II diabetes mellitus ([facility])     Past Surgical History:  Procedure Laterality Date   BIOPSY  03/23/2020   Procedure: BIOPSY;  Surgeon: Juanita Craver, MD;  Location: [facility];  Service: Endoscopy;;   [place]   ESOPHAGOGASTRODUODENOSCOPY (EGD) WITH PROPOFOL N/A 03/23/2020   Procedure: ESOPHAGOGASTRODUODENOSCOPY (EGD) WITH PROPOFOL;  Surgeon: Juanita Craver, MD;  Location: [facility] ENDOSCOPY;  Service: Endoscopy;  Laterality: N/A;   SUPRAVENTRICULAR TACHYCARDIA ABLATION  11/29/2013   SUPRAVENTRICULAR TACHYCARDIA ABLATION N/A 11/29/2013   Procedure: SUPRAVENTRICULAR  TACHYCARDIA ABLATION;  Surgeon: Evans Lance, MD;  Location[facility] CATH LAB;  Service: Cardiovascular;  Laterality: N/A;   US ECHOCARDIOGRAPHY  07/25/2008   EF 55-60%   VAGINAL DELIVERY      Current Outpatient Medications  Medication Sig Dispense Refill   acetaminophen (TYLENOL) 500 MG tablet Take 1,000 mg by mouth every 8 (eight) hours as needed for mild pain or moderate pain.     albuterol (VENTOLIN HFA) 108 (90 Base) MCG/ACT inhaler Inhale 2 puffs into the lungs every 6 (six) hours as needed for wheezing or shortness of breath.     apixaban (ELIQUIS) 2.5 MG TABS tablet Take by mouth 2 (two) times  daily.     benzocaine (ORAJEL) 10 % mucosal gel Use as directed 1 application in the mouth or throat every 6 (six) hours as needed (gum pain/irritation).     diclofenac Sodium (VOLTAREN) 1 % GEL Apply 4 g topically 4 (four) times daily. Apply to lower back     folic acid (FOLVITE) 1 MG tablet Take 1 mg by mouth daily.     glipiZIDE (GLUCOTROL XL) 2.5 MG 24 hr tablet Take 2.5 mg by mouth daily.     ketotifen (ZADITOR) 0.025 % ophthalmic solution Place 1 drop into both eyes daily. For glaucoma     loperamide (IMODIUM) 2 MG capsule Take 2 mg by mouth every 6 (six) hours as needed for diarrhea or loose stools.     montelukast (SINGULAIR) 10 MG tablet Take 1 tablet (10 mg total) by mouth at bedtime. 90 tablet 3   Multiple Vitamin (MULTIVITAMIN WITH MINERALS) TABS tablet Take 1 tablet by mouth daily.     omeprazole (PRILOSEC) 20 MG capsule Take 20 mg by mouth 2 (two) times daily.     ondansetron (ZOFRAN) 4 MG tablet Take 4 mg by mouth every 8 (eight) hours as needed for nausea or vomiting.     oxyCODONE (OXY IR/ROXICODONE) 5 MG immediate release tablet Take 1 tablet (5 mg total) by mouth every 6 (six) hours as needed for moderate pain. 5 tablet 0   promethazine (PHENERGAN) 25 MG suppository Place 1 suppository (25 mg total) rectally every 6 (six) hours as needed for nausea or vomiting. 30 each 0   Propylene Glycol (SYSTANE COMPLETE OP) Apply 1 drop to eye 2 (two) times daily.     sertraline (ZOLOFT) 25 MG tablet Take 25 mg by mouth daily.     sodium chloride (OCEAN) 0.65 % SOLN nasal spray Place 1 spray into both nostrils every 2 (two) hours as needed for congestion (nasal dryness).     sucralfate (CARAFATE) 1 g tablet Take 1 g by mouth 4 (four) times daily. Dissolve tablets     thiamine 100 MG tablet Take 1 tablet (100 mg total) by mouth daily.     feeding supplement, ENSURE COMPLETE, (ENSURE COMPLETE) LIQD Take 237 mLs by mouth 2 (two) times daily between meals. (Patient not taking: Reported on  02/15/2021) 13272 mL 11   ferrous sulfate 325 (65 FE) MG tablet Take 1 tablet (325 mg total) by mouth daily with breakfast. 30 tablet 0   metoprolol tartrate (LOPRESSOR) 25 MG tablet Take 1 tablet (25 mg total) by mouth 2 (two) times daily. 60 tablet 0   No current facility-administered medications for this visit.    Allergies as of 02/09/2022 - Review Complete 02/09/2022  Allergen Reaction Noted   Oxycodone hcl Nausea And Vomiting 11/24/2010   Penicillins Itching 11/24/2010    Family History  Problem Relation Age of Onset   Alzheimer's disease Mother    Lung cancer Mother    COPD Father    Diabetes Maternal Uncle    Stroke Neg Hx    Colon cancer Neg Hx    Stomach cancer Neg Hx    Esophageal cancer Neg Hx     Social History   Socioeconomic History   Marital status: Single    Spouse name: Not on file   Number of children: 2   Years of education: Not on file   Highest education level: Not on file  Occupational History   Occupation: housekeeping    Employer: [employer]: disabled  Tobacco Use   Smoking status: Former    Packs/day: 2.00    Years: 15.00    Total pack years: 30.00    Types: Cigarettes    Quit date: 11/23/1985    Years since quitting: 36.2   Smokeless tobacco: Never  Vaping Use   Vaping Use: Never used  Substance and Sexual Activity   Alcohol use: No   Drug use: No   Sexual activity: Never  Other Topics Concern   Not on file  Social History Narrative   06/17/20 lives at [facility] Determinants of Health   Financial Resource Strain: Low Risk  (09/09/2020)   Overall Financial Resource Strain (CARDIA)    Difficulty of Paying Living Expenses: Not hard at all  Food Insecurity: No Food Insecurity (09/09/2020)   Hunger Vital Sign    Worried About Running Out of Food in the Last Year: Never true    [place] in the Last Year: Never true  Transportation Needs: Unmet Transportation Needs (09/09/2020)   PRAPARE - Transportation     Lack of Transportation (Medical): Yes    Lack of Transportation (Non-Medical): Yes  Physical Activity: Inactive (09/09/2020)   Exercise Vital Sign    Days of Exercise per Week: 0 days    Minutes of Exercise per Session: 0 min  Stress: No Stress Concern Present (09/09/2020)   [place] of Stress : Not at all  Social Connections: Socially Isolated (09/09/2020)   Social Connection and Isolation Panel [NHANES]    Frequency of Communication with Friends and Family: More than three times a week    Frequency of Social Gatherings with Friends and Family: Once a week    Attends Religious Services: Never    Marine scientist or Organizations: No    Attends Archivist Meetings: Never    Marital Status: Never married  Intimate Partner Violence: Not At Risk (09/09/2020)   Humiliation, Afraid, Rape, and Kick questionnaire    Fear of Current or Ex-Partner: No    Emotionally Abused: No    Physically Abused: No    Sexually Abused: No    Review of Systems:    Constitutional: No weight loss, fever or chills Skin: No rash Cardiovascular: No chest pain Respiratory: No SOB Gastrointestinal: See HPI and otherwise negative Genitourinary: No dysuria Neurological: No headache, dizziness or syncope Musculoskeletal: No new muscle or joint pain Hematologic: No bleeding  Psychiatric: No history of depression or anxiety   Physical Exam:  Vital signs: BP (!) 144/76   Pulse (!) 54   Wt 234 lb (106.1 kg)   BMI 32.64 kg/m    Constitutional:   Pleasant obese AA female appears to be in NAD, Well developed, Well nourished,  alert and cooperative Head:  Normocephalic and atraumatic. Eyes:   PEERL, EOMI. No icterus. Conjunctiva pink. Ears:  Normal auditory acuity. Neck:  Supple Throat: Oral cavity and pharynx without inflammation, swelling or lesion.  Respiratory: Respirations even and unlabored. Lungs clear to  auscultation bilaterally.   No wheezes, crackles, or rhonchi.  Cardiovascular: Normal S1, S2. No MRG. Regular rate and rhythm. No peripheral edema, cyanosis or pallor.  Gastrointestinal:  Soft, nondistended, nontender. No rebound or guarding. Normal bowel sounds. No appreciable masses or hepatomegaly. Rectal:  Not performed.  Msk:  Symmetrical without gross deformities. Without edema, no deformity or joint abnormality. +in wheelchair Neurologic:  Alert and  oriented x4;  grossly normal neurologically.  Skin:   Dry and intact without significant lesions or rashes. Psychiatric: Demonstrates good judgement and reason without abnormal affect or behaviors.  No recent labs.  Assessment: 1.  Diarrhea: Describes 2-3 loose watery stools per day over the past 2 years, minimally decreased by Imodium once daily, last colonoscopy in 2016 was normal; consider medication reaction versus infectious cause versus inflammatory versus other  Plan: 1.  We will start by doing some stool studies including a GI path panel, fecal calprotectin, fecal lactoferrin, O&P and fecal pancreatic elastase 2.  Pending results from above we will consider further testing 3.  For now increase Imodium to twice daily dosing at 8:00 and again around 5 or 6 PM 4.  Patient to follow in clinic with [place] per recommendations after testing above.  [name] Newer, PA-C [facility] 02/09/2022, 9:29 AM  Cc: No ref. provider found

## 2022-02-09 NOTE — Patient Instructions (Signed)
Your provider has requested that you go to the basement level for lab work before leaving today. Press "B" on the elevator. The lab is located at the first door on the left as you exit the elevator.  _______________________________________________________  If you are age 75 or older, your body mass index should be between 23-30. Your Body mass index is 32.64 kg/m. If this is out of the aforementioned range listed, please consider follow up with your Primary Care Provider.  If you are age 39 or younger, your body mass index should be between 19-25. Your Body mass index is 32.64 kg/m. If this is out of the aformentioned range listed, please consider follow up with your Primary Care Provider.   ________________________________________________________  The [facility] providers would like to encourage you to use [facility] to communicate with providers for non-urgent requests or questions.  Due to long hold times on the telephone, sending your provider a message by [facility] may be a faster and more efficient way to get a response.  Please allow 48 business hours for a response.  Please remember that this is for non-urgent requests.  _______________________________________________________

## 2022-02-11 DIAGNOSIS — F331 Major depressive disorder, recurrent, moderate: Secondary | ICD-10-CM | POA: Diagnosis not present

## 2022-02-16 DIAGNOSIS — F01A3 Vascular dementia, mild, with mood disturbance: Secondary | ICD-10-CM | POA: Diagnosis not present

## 2022-02-16 DIAGNOSIS — F331 Major depressive disorder, recurrent, moderate: Secondary | ICD-10-CM | POA: Diagnosis not present

## 2022-02-16 DIAGNOSIS — F423 Hoarding disorder: Secondary | ICD-10-CM | POA: Diagnosis not present

## 2022-02-16 DIAGNOSIS — I69815 Cognitive social or emotional deficit following other cerebrovascular disease: Secondary | ICD-10-CM | POA: Diagnosis not present

## 2022-02-17 ENCOUNTER — Telehealth: Payer: Self-pay

## 2022-02-17 NOTE — Telephone Encounter (Signed)
Called patient lvm to return call, to complete AWV at [phone].  If no return call within 15 minutes, patient may reschedule for the next available appointment with [facility] or CMA. -S. Eros [place]our,LPN 

## 2022-02-20 DIAGNOSIS — H04123 Dry eye syndrome of bilateral lacrimal glands: Secondary | ICD-10-CM | POA: Diagnosis not present

## 2022-02-20 DIAGNOSIS — H1045 Other chronic allergic conjunctivitis: Secondary | ICD-10-CM | POA: Diagnosis not present

## 2022-02-20 DIAGNOSIS — H25813 Combined forms of age-related cataract, bilateral: Secondary | ICD-10-CM | POA: Diagnosis not present

## 2022-02-24 DIAGNOSIS — F331 Major depressive disorder, recurrent, moderate: Secondary | ICD-10-CM | POA: Diagnosis not present

## 2022-02-27 DIAGNOSIS — I10 Essential (primary) hypertension: Secondary | ICD-10-CM | POA: Diagnosis not present

## 2022-02-27 DIAGNOSIS — E119 Type 2 diabetes mellitus without complications: Secondary | ICD-10-CM | POA: Diagnosis not present

## 2022-03-03 ENCOUNTER — Ambulatory Visit (INDEPENDENT_AMBULATORY_CARE_PROVIDER_SITE_OTHER): Payer: PPO

## 2022-03-03 DIAGNOSIS — Z0000 Encounter for general adult medical examination without abnormal findings: Secondary | ICD-10-CM

## 2022-03-03 NOTE — Progress Notes (Signed)
Virtual Visit via Telephone Note  I connected with  [name] [name] on 03/03/22 at  9:30 AM EST by telephone and verified that I am speaking with the correct person using two identifiers.  Location: Patient: Home  Provider: [place] Persons participating in the virtual visit: [place]   I discussed the limitations, risks, security and privacy concerns of performing an evaluation and management service by telephone and the availability of in person appointments. The patient expressed understanding and agreed to proceed.  Interactive audio and video telecommunications were attempted between this nurse and patient, however failed, due to patient having technical difficulties OR patient did not have access to video capability.  We continued and completed visit with audio only.  Some vital signs may be absent or patient reported.   Sheral Flow, LPN  Subjective:   [name] [name] is a 76 y.o. female who presents for Medicare Annual (Subsequent) preventive examination.  Review of Systems     Cardiac Risk Factors include: advanced age (>88mn, >>58women);diabetes mellitus;hypertension;obesity (BMI >30kg/m2);sedentary lifestyle     Objective:    Today's Vitals   03/03/22 0941  PainSc: 8   PainLoc: Knee   There is no height or weight on file to calculate BMI.     03/03/2022    9:57 AM 09/09/2020    1:24 PM 03/17/2020    4:00 PM 02/23/2020    7:58 PM 07/11/2019    8:49 AM 01/07/2019   12:00 AM 01/06/2019    5:11 PM  Advanced Directives  Does Patient Have a Medical Advance Directive? No No No No No No No  Would patient like information on creating a medical advance directive? No - Patient declined No - Patient declined No - Patient declined   No - Patient declined     Current Medications (verified) Outpatient Encounter Medications as of 03/03/2022  Medication Sig   acetaminophen (TYLENOL) 500 MG tablet Take 1,000 mg by mouth every 8 (eight)  hours as needed for mild pain or moderate pain.   albuterol (VENTOLIN HFA) 108 (90 Base) MCG/ACT inhaler Inhale 2 puffs into the lungs every 6 (six) hours as needed for wheezing or shortness of breath. (Patient not taking: Reported on 02/09/2022)   apixaban (ELIQUIS) 2.5 MG TABS tablet Take by mouth 2 (two) times daily.   benzocaine (ORAJEL) 10 % mucosal gel Use as directed 1 application in the mouth or throat every 6 (six) hours as needed (gum pain/irritation). (Patient not taking: Reported on 02/09/2022)   diclofenac Sodium (VOLTAREN) 1 % GEL Apply 4 g topically 4 (four) times daily. Apply to lower back   feeding supplement, ENSURE COMPLETE, (ENSURE COMPLETE) LIQD Take 237 mLs by mouth 2 (two) times daily between meals. (Patient not taking: Reported on 02/15/2021)   ferrous sulfate 325 (65 FE) MG tablet Take 1 tablet (325 mg total) by mouth daily with breakfast.   folic acid (FOLVITE) 1 MG tablet Take 1 mg by mouth daily.   glipiZIDE (GLUCOTROL XL) 2.5 MG 24 hr tablet Take 2.5 mg by mouth daily.   ketotifen (ZADITOR) 0.025 % ophthalmic solution Place 1 drop into both eyes daily. For glaucoma (Patient not taking: Reported on 02/09/2022)   loperamide (IMODIUM) 2 MG capsule Take 2 mg by mouth every 6 (six) hours as needed for diarrhea or loose stools.   metoprolol tartrate (LOPRESSOR) 25 MG tablet Take 1 tablet (25 mg total) by mouth 2 (two) times daily.   montelukast (SINGULAIR) 10 MG tablet Take 1  tablet (10 mg total) by mouth at bedtime.   Multiple Vitamin (MULTIVITAMIN WITH MINERALS) TABS tablet Take 1 tablet by mouth daily.   omeprazole (PRILOSEC) 20 MG capsule Take 20 mg by mouth 2 (two) times daily.   ondansetron (ZOFRAN) 4 MG tablet Take 4 mg by mouth every 8 (eight) hours as needed for nausea or vomiting.   oxyCODONE (OXY IR/ROXICODONE) 5 MG immediate release tablet Take 1 tablet (5 mg total) by mouth every 6 (six) hours as needed for moderate pain.   promethazine (PHENERGAN) 25 MG suppository  Place 1 suppository (25 mg total) rectally every 6 (six) hours as needed for nausea or vomiting.   Propylene Glycol (SYSTANE COMPLETE OP) Apply 1 drop to eye 2 (two) times daily. (Patient not taking: Reported on 02/09/2022)   sertraline (ZOLOFT) 25 MG tablet Take 25 mg by mouth daily.   sodium chloride (OCEAN) 0.65 % SOLN nasal spray Place 1 spray into both nostrils every 2 (two) hours as needed for congestion (nasal dryness). (Patient not taking: Reported on 02/09/2022)   sucralfate (CARAFATE) 1 g tablet Take 1 g by mouth 4 (four) times daily. Dissolve tablets   thiamine 100 MG tablet Take 1 tablet (100 mg total) by mouth daily.   No facility-administered encounter medications on file as of 03/03/2022.    Allergies (verified) Oxycodone hcl and Penicillins   History: Past Medical History:  Diagnosis Date   Acute kidney injury ([place])    Aortic stenosis, mild 11/17/2013   Arthritis    "both knees, spine, back, fingers" (11/29/2013)   CHF (congestive heart failure) (HCC)    Edema    GERD (gastroesophageal reflux disease)    Heart murmur    History of diastolic dysfunction    Echo 08/352 with diastolic dysfunction   HTN (hypertension)    Morbid obesity (HCC)    OA (osteoarthritis)    PAC (premature atrial contraction)    per prior Holter   Palpitations    Pneumonia    "as a child"   PVC's (premature ventricular contractions)    per prior Holter   SVT (supraventricular tachycardia)    Tachycardia    noted at 07/28/11 visit. Started on beta blocker. Possible atrial flutter vs long PR tachycardia/AVNRT   Type II diabetes mellitus ([place])    Past Surgical History:  Procedure Laterality Date   BIOPSY  03/23/2020   Procedure: BIOPSY;  Surgeon: Juanita Craver, MD;  Location: [facility];  Service: Endoscopy;;   [place]   ESOPHAGOGASTRODUODENOSCOPY (EGD) WITH PROPOFOL N/A 03/23/2020   Procedure: ESOPHAGOGASTRODUODENOSCOPY (EGD) WITH PROPOFOL;   Surgeon: Juanita Craver, MD;  Location[facility] ENDOSCOPY;  Service: Endoscopy;  Laterality: N/A;   SUPRAVENTRICULAR TACHYCARDIA ABLATION  11/29/2013   SUPRAVENTRICULAR TACHYCARDIA ABLATION N/A 11/29/2013   Procedure: SUPRAVENTRICULAR TACHYCARDIA ABLATION;  Surgeon: Evans Lance, MD;  Location: [facility] CATH LAB;  Service: Cardiovascular;  Laterality: N/A;   US ECHOCARDIOGRAPHY  07/25/2008   EF 55-60%   VAGINAL DELIVERY     Family History  Problem Relation Age of Onset   Alzheimer's disease Mother    Lung cancer Mother    COPD Father    Diabetes Maternal Uncle    Stroke Neg Hx    Colon cancer Neg Hx    Stomach cancer Neg Hx    Esophageal cancer Neg Hx    Social History   Socioeconomic History   Marital status: Single    Spouse name: Not on file  Number of children: 2   Years of education: Not on file   Highest education level: Not on file  Occupational History   Occupation: housekeeping    Employer: [employer]: disabled   Occupation: retired  Tobacco Use   Smoking status: Former    Packs/day: 2.00    Years: 15.00    Total pack years: 30.00    Types: Cigarettes    Quit date: 11/23/1985    Years since quitting: 36.2   Smokeless tobacco: Never  Vaping Use   Vaping Use: Never used  Substance and Sexual Activity   Alcohol use: No   Drug use: No   Sexual activity: Never  Other Topics Concern   Not on file  Social History Narrative   06/17/20 lives at [place] Strain: [place]  (03/03/2022)   Overall Financial Resource Strain (CARDIA)    Difficulty of Paying Living Expenses: Not hard at all  Food Insecurity: No [place] (03/03/2022)   Hunger Vital Sign    Worried About Running Out of Food in the Last Year: Never true    [place] in the Last Year: Never true  Transportation Needs: No Transportation Needs (03/03/2022)   PRAPARE - Hydrologist (Medical): No    Lack of  Transportation (Non-Medical): No  Physical Activity: Inactive (03/03/2022)   Exercise Vital Sign    Days of Exercise per Week: 0 days    Minutes of Exercise per Session: 0 min  Stress: No Stress Concern Present (03/03/2022)   [place]    Feeling of Stress : Not at all  Social Connections: Socially Isolated (03/03/2022)   Social Connection and Isolation Panel [NHANES]    Frequency of Communication with Friends and Family: More than three times a week    Frequency of Social Gatherings with Friends and Family: Once a week    Attends Religious Services: Never    Marine scientist or Organizations: No    Attends Music therapist: Never    Marital Status: Never married    Tobacco Counseling Counseling given: Not Answered   Clinical Intake:  Pre-visit preparation completed: Yes  Pain : No/denies pain Pain Score: 8      BMI - recorded: 32.64 (02/09/2022) Nutritional Status: BMI > 30  Obese Nutritional Risks: None Diabetes: Yes CBG done?: No Did pt. bring in CBG monitor from home?: No  How often do you need to have someone help you when you read instructions, pamphlets, or other written materials from your doctor or pharmacy?: 1 - Never What is the last grade level you completed in school?: HSG  Diabetic? Patient is prediabetic   Interpreter Needed?: No  Information entered by :: Lisette Abu, LPN.   Activities of Daily Living    03/03/2022    9:54 AM  In your present state of health, do you have any difficulty performing the following activities:  Hearing? 1  Vision? 0  Difficulty concentrating or making decisions? 0  Walking or climbing stairs? 1  Dressing or bathing? 0  Doing errands, shopping? 1  Preparing Food and eating ? N  Using the Toilet? N  In the past six months, have you accidently leaked urine? Y  Do you have problems with loss of bowel control? Y  Managing your  Medications? N  Managing your Finances? N  Housekeeping or managing your Housekeeping? N    Patient Care Team: Hoyt Koch, MD as PCP - General (Internal Medicine) Marzetta Board, DPM as Consulting Physician (Podiatry) [facility] Charlton Haws, RPH (Pharmacist) Katy Fitch, Darlina Guys, MD as Consulting Physician (Ophthalmology)  Indicate any recent Medical Services you may have received from other than [facility] providers in the past year (date may be approximate).     Assessment:   This is a routine wellness examination for [place].  Hearing/Vision screen Hearing Screening - Comments:: Denies hearing difficulties   Vision Screening - Comments:: Wears rx glasses - up to date with routine eye exams with Debbra Riding, MD.   Dietary issues and exercise activities discussed: Current Exercise Habits: The patient does not participate in regular exercise at present, Exercise limited by: orthopedic condition(s)   Goals Addressed   None   Depression Screen    03/03/2022    9:51 AM 09/09/2020    1:46 PM 07/11/2019    8:51 AM 05/13/2018    8:52 AM 05/13/2018    8:42 AM 10/11/2017   11:23 AM 09/10/2017   10:40 AM  PHQ 2/9 Scores  PHQ - 2 Score 0 0 0 0 0 0 0  PHQ- 9 Score    3       Fall Risk    03/03/2022    9:54 AM 09/09/2020    1:48 PM 07/28/2019   10:43 AM 07/11/2019    8:51 AM 05/13/2018    8:52 AM  Fall Risk   Falls in the past year? 0 0 0 0 1  Number falls in past yr: 0 0 0 0 0  Injury with Fall? 0 0 0 0 0  Risk for fall due to : No Fall Risks Impaired balance/gait  Impaired balance/gait Impaired balance/gait;Impaired mobility  Follow up Falls prevention discussed   Falls evaluation completed;Education provided;Falls prevention discussed Falls prevention discussed    FALL RISK PREVENTION PERTAINING TO THE HOME:  Any stairs in or around the home? No  If so, are there any without handrails? No  Home free of loose throw rugs in walkways,  pet beds, electrical cords, etc? Yes  Adequate lighting in your home to reduce risk of falls? Yes   ASSISTIVE DEVICES UTILIZED TO PREVENT FALLS:  Life alert? Yes  Use of a cane, walker or w/c? Yes  Grab bars in the bathroom? Yes  Shower chair or bench in shower? Yes  Elevated toilet seat or a handicapped toilet? Yes   TIMED UP AND GO:  Was the test performed? No . Phone Visit   Cognitive Function:    05/11/2017    9:52 AM  MMSE - Mini Mental State Exam  Orientation to time 5  Orientation to Place 5  Registration 3  Attention/ Calculation 4  Recall 2  Language- name 2 objects 2  Language- repeat 1  Language- follow 3 step command 3  Language- read & follow direction 1  Write a sentence 1  Copy design 1  Total score 28        03/03/2022    9:54 AM 07/11/2019   10:35 AM  6CIT Screen  What Year? 0 points 0 points  What month? 0 points 0 points  What time? 0 points 0 points  Count back from 20 0 points 0 points  Months in reverse 0 points 0 points  Repeat phrase 0 points 0 points  Total Score 0 points 0 points  Immunizations Immunization History  Administered Date(s) Administered   Fluad Quad(high Dose 65+) 01/01/2019   Influenza, High Dose Seasonal PF 01/23/2013, 03/03/2016   Influenza,inj,Quad PF,6+ Mos 01/05/2014, 03/05/2015   Influenza-Unspecified 01/18/2017, 01/17/2018   PFIZER(Purple Top)SARS-COV-2 Vaccination 06/27/2019, 06/27/2019, 07/18/2019   Pneumococcal Conjugate-13 07/24/2014   Pneumococcal Polysaccharide-23 10/19/2012   Tdap 10/19/2012    TDAP status: Up to date  Flu Vaccine status: Up to date  Pneumococcal vaccine status: Up to date  Covid-19 vaccine status: Completed vaccines  Qualifies for Shingles Vaccine? Yes   Zostavax completed No   Shingrix Completed?: No.    Education has been provided regarding the importance of this vaccine. Patient has been advised to call insurance company to determine out of pocket expense if they have not  yet received this vaccine. Advised may also receive vaccine at local pharmacy or Health Dept. Verbalized acceptance and understanding.  Screening Tests Health Maintenance  Topic Date Due   Diabetic kidney evaluation - Urine ACR  Never done   Zoster Vaccines- Shingrix (1 of 2) Never done   OPHTHALMOLOGY EXAM  07/19/2019   FOOT EXAM  03/20/2020   HEMOGLOBIN A1C  08/15/2021   INFLUENZA VACCINE  11/04/2021   COVID-19 Vaccine (4 - 2023-24 season) 12/05/2021   Diabetic kidney evaluation - GFR measurement  02/20/2022   Medicare Annual Wellness (AWV)  03/04/2023   COLONOSCOPY (Pts 45-29yr Insurance coverage will need to be confirmed)  07/30/2024   Pneumonia Vaccine 75 Years old  Completed   DEXA SCAN  Completed   Hepatitis C Screening  Completed   HPV VACCINES  Aged Out    Health Maintenance  Health Maintenance Due  Topic Date Due   Diabetic kidney evaluation - Urine ACR  Never done   Zoster Vaccines- Shingrix (1 of 2) Never done   OPHTHALMOLOGY EXAM  07/19/2019   FOOT EXAM  03/20/2020   HEMOGLOBIN A1C  08/15/2021   INFLUENZA VACCINE  11/04/2021   COVID-19 Vaccine (4 - 2023-24 season) 12/05/2021   Diabetic kidney evaluation - GFR measurement  02/20/2022    Colorectal cancer screening: Type of screening: Colonoscopy. Completed 07/31/2014. Repeat every 10 years  Mammogram status: Completed 06/30/2017. Repeat every year  Bone Density status: never done  Lung Cancer Screening: (Low Dose CT Chest recommended if Age 75-80years, 30 pack-year currently smoking OR have quit w/in 15years.) does not qualify.   Lung Cancer Screening Referral: no  Additional Screening:  Hepatitis C Screening: does qualify; Completed 04/03/2020  Vision Screening: Recommended annual ophthalmology exams for early detection of glaucoma and other disorders of the eye. Is the patient up to date with their annual eye exam?  Yes  Who is the provider or what is the name of the office in which the patient  attends annual eye exams? R. SWyatt Portela MD. If pt is not established with a provider, would they like to be referred to a provider to establish care? No .   Dental Screening: Recommended annual dental exams for proper oral hygiene  Community Resource Referral / Chronic Care Management: CRR required this visit?  No   CCM required this visit?  No      Plan:     I have personally reviewed and noted the following in the patient's chart:   Medical and social history Use of alcohol, tobacco or illicit drugs  Current medications and supplements including opioid prescriptions. Patient is currently taking opioid prescriptions. Information provided to patient regarding non-opioid alternatives. Patient advised to discuss non-opioid  treatment plan with their provider. Functional ability and status Nutritional status Physical activity Advanced directives List of other physicians Hospitalizations, surgeries, and ER visits in previous 12 months Vitals Screenings to include cognitive, depression, and falls Referrals and appointments  In addition, I have reviewed and discussed with patient certain preventive protocols, quality metrics, and best practice recommendations. A written personalized care plan for preventive services as well as general preventive health recommendations were provided to patient.     Sheral Flow, LPN   18/59/0931   Nurse Notes: N/A

## 2022-03-03 NOTE — Patient Instructions (Signed)
[name] [name] , Thank you for taking time to come for your Medicare Wellness Visit. I appreciate your ongoing commitment to your health goals. Please review the following plan we discussed and let me know if I can assist you in the future.   These are the goals we discussed:  Goals      Client understands the importance of follow-up with providers by attending scheduled visits        This is a list of the screening recommended for you and due dates:  Health Maintenance  Topic Date Due   Yearly kidney health urinalysis for diabetes  Never done   Zoster (Shingles) Vaccine (1 of 2) Never done   Eye exam for diabetics  07/19/2019   Complete foot exam   03/20/2020   Hemoglobin A1C  08/15/2021   Flu Shot  11/04/2021   COVID-19 Vaccine (4 - 2023-24 season) 12/05/2021   Yearly kidney function blood test for diabetes  02/20/2022   Medicare Annual Wellness Visit  03/04/2023   Colon Cancer Screening  07/30/2024   Pneumonia Vaccine  Completed   DEXA scan (bone density measurement)  Completed   Hepatitis C Screening: USPSTF Recommendation to screen - Ages 27-79 yo.  Completed   HPV Vaccine  Aged Out    Advanced directives: No  Conditions/risks identified: Yes  Next appointment: Follow up in one year for your annual wellness visit.   Preventive Care 2 Years and Older, Female Preventive care refers to lifestyle choices and visits with your health care provider that can promote health and wellness. What does preventive care include? A yearly physical exam. This is also called an annual well check. Dental exams once or twice a year. Routine eye exams. Ask your health care provider how often you should have your eyes checked. Personal lifestyle choices, including: Daily care of your teeth and gums. Regular physical activity. Eating a healthy diet. Avoiding tobacco and drug use. Limiting alcohol use. Practicing safe sex. Taking low-dose aspirin every day. Taking vitamin and mineral  supplements as recommended by your health care provider. What happens during an annual well check? The services and screenings done by your health care provider during your annual well check will depend on your age, overall health, lifestyle risk factors, and family history of disease. Counseling  Your health care provider may ask you questions about your: Alcohol use. Tobacco use. Drug use. Emotional well-being. Home and relationship well-being. Sexual activity. Eating habits. History of falls. Memory and ability to understand (cognition). Work and work Statistician. Reproductive health. Screening  You may have the following tests or measurements: Height, weight, and BMI. Blood pressure. Lipid and cholesterol levels. These may be checked every 5 years, or more frequently if you are over 80 years old. Skin check. Lung cancer screening. You may have this screening every year starting at age 54 if you have a 30-pack-year history of smoking and currently smoke or have quit within the past 15 years. Fecal occult blood test (FOBT) of the stool. You may have this test every year starting at age 5. Flexible sigmoidoscopy or colonoscopy. You may have a sigmoidoscopy every 5 years or a colonoscopy every 10 years starting at age 20. Hepatitis C blood test. Hepatitis B blood test. Sexually transmitted disease (STD) testing. Diabetes screening. This is done by checking your blood sugar (glucose) after you have not eaten for a while (fasting). You may have this done every 1-3 years. Bone density scan. This is done to screen for osteoporosis.  You may have this done starting at age 43. Mammogram. This may be done every 1-2 years. Talk to your health care provider about how often you should have regular mammograms. Talk with your health care provider about your test results, treatment options, and if necessary, the need for more tests. Vaccines  Your health care provider may recommend certain  vaccines, such as: Influenza vaccine. This is recommended every year. Tetanus, diphtheria, and acellular pertussis (Tdap, Td) vaccine. You may need a Td booster every 10 years. Zoster vaccine. You may need this after age 71. Pneumococcal 13-valent conjugate (PCV13) vaccine. One dose is recommended after age 37. Pneumococcal polysaccharide (PPSV23) vaccine. One dose is recommended after age 85. Talk to your health care provider about which screenings and vaccines you need and how often you need them. This information is not intended to replace advice given to you by your health care provider. Make sure you discuss any questions you have with your health care provider. Document Released: 04/19/2015 Document Revised: 12/11/2015 Document Reviewed: 01/22/2015 Elsevier Interactive Patient Education  2017 [place] Prevention in the Home Falls can cause injuries. They can happen to people of all ages. There are many things you can do to make your home safe and to help prevent falls. What can I do on the outside of my home? Regularly fix the edges of walkways and driveways and fix any cracks. Remove anything that might make you trip as you walk through a door, such as a raised step or threshold. Trim any bushes or trees on the path to your home. Use bright outdoor lighting. Clear any walking paths of anything that might make someone trip, such as rocks or tools. Regularly check to see if handrails are loose or broken. Make sure that both sides of any steps have handrails. Any raised decks and porches should have guardrails on the edges. Have any leaves, snow, or ice cleared regularly. Use sand or salt on walking paths during winter. Clean up any spills in your garage right away. This includes oil or grease spills. What can I do in the bathroom? Use night lights. Install grab bars by the toilet and in the tub and shower. Do not use towel bars as grab bars. Use non-skid mats or decals in  the tub or shower. If you need to sit down in the shower, use a plastic, non-slip stool. Keep the floor dry. Clean up any water that spills on the floor as soon as it happens. Remove soap buildup in the tub or shower regularly. Attach bath mats securely with double-sided non-slip rug tape. Do not have throw rugs and other things on the floor that can make you trip. What can I do in the bedroom? Use night lights. Make sure that you have a light by your bed that is easy to reach. Do not use any sheets or blankets that are too big for your bed. They should not hang down onto the floor. Have a firm chair that has side arms. You can use this for support while you get dressed. Do not have throw rugs and other things on the floor that can make you trip. What can I do in the kitchen? Clean up any spills right away. Avoid walking on wet floors. Keep items that you use a lot in easy-to-reach places. If you need to reach something above you, use a strong step stool that has a grab bar. Keep electrical cords out of the way. Do not use  floor polish or wax that makes floors slippery. If you must use wax, use non-skid floor wax. Do not have throw rugs and other things on the floor that can make you trip. What can I do with my stairs? Do not leave any items on the stairs. Make sure that there are handrails on both sides of the stairs and use them. Fix handrails that are broken or loose. Make sure that handrails are as long as the stairways. Check any carpeting to make sure that it is firmly attached to the stairs. Fix any carpet that is loose or worn. Avoid having throw rugs at the top or bottom of the stairs. If you do have throw rugs, attach them to the floor with carpet tape. Make sure that you have a light switch at the top of the stairs and the bottom of the stairs. If you do not have them, ask someone to add them for you. What else can I do to help prevent falls? Wear shoes that: Do not have high  heels. Have rubber bottoms. Are comfortable and fit you well. Are closed at the toe. Do not wear sandals. If you use a stepladder: Make sure that it is fully opened. Do not climb a closed stepladder. Make sure that both sides of the stepladder are locked into place. Ask someone to hold it for you, if possible. Clearly mark and make sure that you can see: Any grab bars or handrails. First and last steps. Where the edge of each step is. Use tools that help you move around (mobility aids) if they are needed. These include: Canes. Walkers. Scooters. Crutches. Turn on the lights when you go into a dark area. Replace any light bulbs as soon as they burn out. Set up your furniture so you have a clear path. Avoid moving your furniture around. If any of your floors are uneven, fix them. If there are any pets around you, be aware of where they are. Review your medicines with your doctor. Some medicines can make you feel dizzy. This can increase your chance of falling. Ask your doctor what other things that you can do to help prevent falls. This information is not intended to replace advice given to you by your health care provider. Make sure you discuss any questions you have with your health care provider. Document Released: 01/17/2009 Document Revised: 08/29/2015 Document Reviewed: 04/27/2014 Elsevier Interactive Patient Education  2017 [employer].

## 2022-03-04 DIAGNOSIS — F331 Major depressive disorder, recurrent, moderate: Secondary | ICD-10-CM | POA: Diagnosis not present

## 2022-03-04 DIAGNOSIS — F32A Depression, unspecified: Secondary | ICD-10-CM | POA: Diagnosis not present

## 2022-03-06 ENCOUNTER — Telehealth: Payer: Self-pay | Admitting: Internal Medicine

## 2022-03-06 DIAGNOSIS — M2550 Pain in unspecified joint: Secondary | ICD-10-CM | POA: Diagnosis not present

## 2022-03-06 DIAGNOSIS — R41841 Cognitive communication deficit: Secondary | ICD-10-CM | POA: Diagnosis not present

## 2022-03-06 DIAGNOSIS — G9341 Metabolic encephalopathy: Secondary | ICD-10-CM | POA: Diagnosis not present

## 2022-03-06 DIAGNOSIS — R279 Unspecified lack of coordination: Secondary | ICD-10-CM | POA: Diagnosis not present

## 2022-03-06 DIAGNOSIS — M6281 Muscle weakness (generalized): Secondary | ICD-10-CM | POA: Diagnosis not present

## 2022-03-06 NOTE — Telephone Encounter (Signed)
Patient called and states that she needs her medical information faxed over to [facility] transportation. Their fax number is [phone]. If there are any questions or steps patient needs to take she can be contacted at [phone].

## 2022-03-06 NOTE — Telephone Encounter (Signed)
Faxed to [facility] transportation for patient

## 2022-03-10 DIAGNOSIS — F331 Major depressive disorder, recurrent, moderate: Secondary | ICD-10-CM | POA: Diagnosis not present

## 2022-03-10 DIAGNOSIS — F01A3 Vascular dementia, mild, with mood disturbance: Secondary | ICD-10-CM | POA: Diagnosis not present

## 2022-03-10 DIAGNOSIS — F423 Hoarding disorder: Secondary | ICD-10-CM | POA: Diagnosis not present

## 2022-03-10 DIAGNOSIS — I69815 Cognitive social or emotional deficit following other cerebrovascular disease: Secondary | ICD-10-CM | POA: Diagnosis not present

## 2022-03-11 DIAGNOSIS — F331 Major depressive disorder, recurrent, moderate: Secondary | ICD-10-CM | POA: Diagnosis not present

## 2022-03-13 DIAGNOSIS — N1831 Chronic kidney disease, stage 3a: Secondary | ICD-10-CM | POA: Diagnosis not present

## 2022-03-13 DIAGNOSIS — D509 Iron deficiency anemia, unspecified: Secondary | ICD-10-CM | POA: Diagnosis not present

## 2022-03-13 DIAGNOSIS — I82409 Acute embolism and thrombosis of unspecified deep veins of unspecified lower extremity: Secondary | ICD-10-CM | POA: Diagnosis not present

## 2022-03-13 DIAGNOSIS — E119 Type 2 diabetes mellitus without complications: Secondary | ICD-10-CM | POA: Diagnosis not present

## 2022-03-13 DIAGNOSIS — I10 Essential (primary) hypertension: Secondary | ICD-10-CM | POA: Diagnosis not present

## 2022-03-16 DIAGNOSIS — E559 Vitamin D deficiency, unspecified: Secondary | ICD-10-CM | POA: Diagnosis not present

## 2022-03-16 DIAGNOSIS — E119 Type 2 diabetes mellitus without complications: Secondary | ICD-10-CM | POA: Diagnosis not present

## 2022-03-16 DIAGNOSIS — I10 Essential (primary) hypertension: Secondary | ICD-10-CM | POA: Diagnosis not present

## 2022-03-16 DIAGNOSIS — E649 Sequelae of unspecified nutritional deficiency: Secondary | ICD-10-CM | POA: Diagnosis not present

## 2022-03-16 DIAGNOSIS — Z79899 Other long term (current) drug therapy: Secondary | ICD-10-CM | POA: Diagnosis not present

## 2022-03-17 DIAGNOSIS — B351 Tinea unguium: Secondary | ICD-10-CM | POA: Diagnosis not present

## 2022-03-17 DIAGNOSIS — E1159 Type 2 diabetes mellitus with other circulatory complications: Secondary | ICD-10-CM | POA: Diagnosis not present

## 2022-03-18 ENCOUNTER — Emergency Department (HOSPITAL_COMMUNITY): Payer: PPO

## 2022-03-18 ENCOUNTER — Other Ambulatory Visit: Payer: Self-pay

## 2022-03-18 ENCOUNTER — Inpatient Hospital Stay (HOSPITAL_COMMUNITY)
Admission: EM | Admit: 2022-03-18 | Discharge: 2022-03-25 | DRG: 689 | Disposition: A | Discharge status: Skilled Nursing Facility | Payer: PPO | Source: Skilled Nursing Facility | Attending: Student | Admitting: Student

## 2022-03-18 ENCOUNTER — Encounter (HOSPITAL_COMMUNITY): Payer: Self-pay

## 2022-03-18 DIAGNOSIS — I4719 Other supraventricular tachycardia: Secondary | ICD-10-CM | POA: Diagnosis not present

## 2022-03-18 DIAGNOSIS — L8990 Pressure ulcer of unspecified site, unspecified stage: Secondary | ICD-10-CM | POA: Insufficient documentation

## 2022-03-18 DIAGNOSIS — R6520 Severe sepsis without septic shock: Secondary | ICD-10-CM | POA: Diagnosis not present

## 2022-03-18 DIAGNOSIS — H538 Other visual disturbances: Secondary | ICD-10-CM | POA: Diagnosis present

## 2022-03-18 DIAGNOSIS — Z820 Family history of epilepsy and other diseases of the nervous system: Secondary | ICD-10-CM | POA: Diagnosis not present

## 2022-03-18 DIAGNOSIS — T68XXXA Hypothermia, initial encounter: Secondary | ICD-10-CM | POA: Diagnosis not present

## 2022-03-18 DIAGNOSIS — R001 Bradycardia, unspecified: Secondary | ICD-10-CM

## 2022-03-18 DIAGNOSIS — A4151 Sepsis due to Escherichia coli [E. coli]: Principal | ICD-10-CM | POA: Diagnosis present

## 2022-03-18 DIAGNOSIS — Z801 Family history of malignant neoplasm of trachea, bronchus and lung: Secondary | ICD-10-CM | POA: Diagnosis not present

## 2022-03-18 DIAGNOSIS — Z833 Family history of diabetes mellitus: Secondary | ICD-10-CM

## 2022-03-18 DIAGNOSIS — Z79899 Other long term (current) drug therapy: Secondary | ICD-10-CM | POA: Diagnosis not present

## 2022-03-18 DIAGNOSIS — Z825 Family history of asthma and other chronic lower respiratory diseases: Secondary | ICD-10-CM

## 2022-03-18 DIAGNOSIS — Z8719 Personal history of other diseases of the digestive system: Secondary | ICD-10-CM | POA: Diagnosis not present

## 2022-03-18 DIAGNOSIS — B9620 Unspecified Escherichia coli [E. coli] as the cause of diseases classified elsewhere: Secondary | ICD-10-CM | POA: Diagnosis present

## 2022-03-18 DIAGNOSIS — E440 Moderate protein-calorie malnutrition: Secondary | ICD-10-CM | POA: Diagnosis not present

## 2022-03-18 DIAGNOSIS — M7989 Other specified soft tissue disorders: Secondary | ICD-10-CM | POA: Diagnosis not present

## 2022-03-18 DIAGNOSIS — L89151 Pressure ulcer of sacral region, stage 1: Secondary | ICD-10-CM | POA: Diagnosis not present

## 2022-03-18 DIAGNOSIS — R531 Weakness: Secondary | ICD-10-CM | POA: Diagnosis not present

## 2022-03-18 DIAGNOSIS — I110 Hypertensive heart disease with heart failure: Secondary | ICD-10-CM | POA: Diagnosis present

## 2022-03-18 DIAGNOSIS — I5032 Chronic diastolic (congestive) heart failure: Secondary | ICD-10-CM | POA: Diagnosis not present

## 2022-03-18 DIAGNOSIS — R41841 Cognitive communication deficit: Secondary | ICD-10-CM | POA: Diagnosis not present

## 2022-03-18 DIAGNOSIS — H903 Sensorineural hearing loss, bilateral: Secondary | ICD-10-CM | POA: Diagnosis not present

## 2022-03-18 DIAGNOSIS — Z87891 Personal history of nicotine dependence: Secondary | ICD-10-CM

## 2022-03-18 DIAGNOSIS — M6259 Muscle wasting and atrophy, not elsewhere classified, multiple sites: Secondary | ICD-10-CM | POA: Diagnosis not present

## 2022-03-18 DIAGNOSIS — J189 Pneumonia, unspecified organism: Secondary | ICD-10-CM | POA: Diagnosis not present

## 2022-03-18 DIAGNOSIS — R42 Dizziness and giddiness: Secondary | ICD-10-CM | POA: Diagnosis not present

## 2022-03-18 DIAGNOSIS — Z7984 Long term (current) use of oral hypoglycemic drugs: Secondary | ICD-10-CM | POA: Diagnosis not present

## 2022-03-18 DIAGNOSIS — I890 Lymphedema, not elsewhere classified: Secondary | ICD-10-CM | POA: Diagnosis not present

## 2022-03-18 DIAGNOSIS — D638 Anemia in other chronic diseases classified elsewhere: Secondary | ICD-10-CM | POA: Diagnosis not present

## 2022-03-18 DIAGNOSIS — D509 Iron deficiency anemia, unspecified: Secondary | ICD-10-CM | POA: Diagnosis not present

## 2022-03-18 DIAGNOSIS — R680 Hypothermia, not associated with low environmental temperature: Secondary | ICD-10-CM | POA: Diagnosis present

## 2022-03-18 DIAGNOSIS — R441 Visual hallucinations: Secondary | ICD-10-CM | POA: Diagnosis not present

## 2022-03-18 DIAGNOSIS — F419 Anxiety disorder, unspecified: Secondary | ICD-10-CM | POA: Diagnosis not present

## 2022-03-18 DIAGNOSIS — K219 Gastro-esophageal reflux disease without esophagitis: Secondary | ICD-10-CM | POA: Diagnosis not present

## 2022-03-18 DIAGNOSIS — Z20822 Contact with and (suspected) exposure to covid-19: Secondary | ICD-10-CM | POA: Diagnosis present

## 2022-03-18 DIAGNOSIS — E1165 Type 2 diabetes mellitus with hyperglycemia: Secondary | ICD-10-CM | POA: Diagnosis present

## 2022-03-18 DIAGNOSIS — M25572 Pain in left ankle and joints of left foot: Secondary | ICD-10-CM | POA: Diagnosis not present

## 2022-03-18 DIAGNOSIS — F32A Depression, unspecified: Secondary | ICD-10-CM | POA: Diagnosis not present

## 2022-03-18 DIAGNOSIS — K224 Dyskinesia of esophagus: Secondary | ICD-10-CM | POA: Diagnosis not present

## 2022-03-18 DIAGNOSIS — M4802 Spinal stenosis, cervical region: Secondary | ICD-10-CM | POA: Diagnosis not present

## 2022-03-18 DIAGNOSIS — I480 Paroxysmal atrial fibrillation: Secondary | ICD-10-CM | POA: Diagnosis present

## 2022-03-18 DIAGNOSIS — J984 Other disorders of lung: Secondary | ICD-10-CM | POA: Diagnosis not present

## 2022-03-18 DIAGNOSIS — A419 Sepsis, unspecified organism: Secondary | ICD-10-CM | POA: Diagnosis not present

## 2022-03-18 DIAGNOSIS — I471 Supraventricular tachycardia, unspecified: Secondary | ICD-10-CM | POA: Diagnosis present

## 2022-03-18 DIAGNOSIS — I872 Venous insufficiency (chronic) (peripheral): Secondary | ICD-10-CM | POA: Diagnosis present

## 2022-03-18 DIAGNOSIS — Z7901 Long term (current) use of anticoagulants: Secondary | ICD-10-CM | POA: Diagnosis not present

## 2022-03-18 DIAGNOSIS — Z1152 Encounter for screening for COVID-19: Secondary | ICD-10-CM | POA: Diagnosis not present

## 2022-03-18 DIAGNOSIS — E119 Type 2 diabetes mellitus without complications: Secondary | ICD-10-CM

## 2022-03-18 DIAGNOSIS — M7501 Adhesive capsulitis of right shoulder: Secondary | ICD-10-CM | POA: Diagnosis present

## 2022-03-18 DIAGNOSIS — M549 Dorsalgia, unspecified: Secondary | ICD-10-CM | POA: Diagnosis not present

## 2022-03-18 DIAGNOSIS — R278 Other lack of coordination: Secondary | ICD-10-CM | POA: Diagnosis not present

## 2022-03-18 DIAGNOSIS — Z6829 Body mass index (BMI) 29.0-29.9, adult: Secondary | ICD-10-CM

## 2022-03-18 DIAGNOSIS — Z7401 Bed confinement status: Secondary | ICD-10-CM | POA: Diagnosis not present

## 2022-03-18 DIAGNOSIS — N390 Urinary tract infection, site not specified: Secondary | ICD-10-CM | POA: Insufficient documentation

## 2022-03-18 DIAGNOSIS — F331 Major depressive disorder, recurrent, moderate: Secondary | ICD-10-CM | POA: Diagnosis not present

## 2022-03-18 DIAGNOSIS — N189 Chronic kidney disease, unspecified: Secondary | ICD-10-CM | POA: Diagnosis not present

## 2022-03-18 DIAGNOSIS — M6281 Muscle weakness (generalized): Secondary | ICD-10-CM | POA: Diagnosis not present

## 2022-03-18 LAB — URINALYSIS, ROUTINE W REFLEX MICROSCOPIC
Bilirubin Urine: NEGATIVE
Glucose, UA: NEGATIVE mg/dL
Hgb urine dipstick: NEGATIVE
Ketones, ur: NEGATIVE mg/dL
Leukocytes,Ua: NEGATIVE
Nitrite: POSITIVE — AB
Protein, ur: NEGATIVE mg/dL
Specific Gravity, Urine: 1.016 (ref 1.005–1.030)
pH: 6 (ref 5.0–8.0)

## 2022-03-18 LAB — CBC WITH DIFFERENTIAL/PLATELET
Abs Immature Granulocytes: 0.01 10*3/uL (ref 0.00–0.07)
Basophils Absolute: 0 10*3/uL (ref 0.0–0.1)
Basophils Relative: 1 %
Eosinophils Absolute: 0.1 10*3/uL (ref 0.0–0.5)
Eosinophils Relative: 2 %
HCT: 34.9 % — ABNORMAL LOW (ref 36.0–46.0)
Hemoglobin: 10.4 g/dL — ABNORMAL LOW (ref 12.0–15.0)
Immature Granulocytes: 0 %
Lymphocytes Relative: 34 %
Lymphs Abs: 1.7 10*3/uL (ref 0.7–4.0)
MCH: 25.8 pg — ABNORMAL LOW (ref 26.0–34.0)
MCHC: 29.8 g/dL — ABNORMAL LOW (ref 30.0–36.0)
MCV: 86.6 fL (ref 80.0–100.0)
Monocytes Absolute: 0.2 10*3/uL (ref 0.1–1.0)
Monocytes Relative: 5 %
Neutro Abs: 2.9 10*3/uL (ref 1.7–7.7)
Neutrophils Relative %: 58 %
Platelets: 173 10*3/uL (ref 150–400)
RBC: 4.03 MIL/uL (ref 3.87–5.11)
RDW: 17 % — ABNORMAL HIGH (ref 11.5–15.5)
WBC: 4.9 10*3/uL (ref 4.0–10.5)
nRBC: 0 % (ref 0.0–0.2)

## 2022-03-18 LAB — RESP PANEL BY RT-PCR (RSV, FLU A&B, COVID)  RVPGX2
Influenza A by PCR: NEGATIVE
Influenza B by PCR: NEGATIVE
Resp Syncytial Virus by PCR: NEGATIVE
SARS Coronavirus 2 by RT PCR: NEGATIVE

## 2022-03-18 LAB — BASIC METABOLIC PANEL
Anion gap: 10 (ref 5–15)
BUN: 27 mg/dL — ABNORMAL HIGH (ref 8–23)
CO2: 21 mmol/L — ABNORMAL LOW (ref 22–32)
Calcium: 9.3 mg/dL (ref 8.9–10.3)
Chloride: 110 mmol/L (ref 98–111)
Creatinine, Ser: 0.89 mg/dL (ref 0.44–1.00)
GFR, Estimated: >60 mL/min (ref >60)
Glucose, Bld: 83 mg/dL (ref 70–99)
Potassium: 5.1 mmol/L (ref 3.5–5.1)
Sodium: 141 mmol/L (ref 135–145)

## 2022-03-18 LAB — LACTIC ACID, PLASMA: Lactic Acid, Venous: 0.9 mmol/L (ref 0.5–1.9)

## 2022-03-18 MED ORDER — SODIUM CHLORIDE 0.9 % IV SOLN
1.0000 g | Freq: Once | INTRAVENOUS | Status: AC
  Administered 2022-03-18: 1 g via INTRAVENOUS
  Filled 2022-03-18: qty 10

## 2022-03-18 MED ORDER — DOXYCYCLINE HYCLATE 100 MG PO TABS
100.0000 mg | ORAL_TABLET | Freq: Once | ORAL | Status: AC
  Administered 2022-03-18: 100 mg via ORAL
  Filled 2022-03-18: qty 1

## 2022-03-18 NOTE — H&P (Incomplete)
History and Physical  [name] [name] WUJ:[id number] DOB: 12-Jul-1945 DOA: 03/18/2022  Referring physician: Dr. Verl Dicker, EDP PCP: Hoyt Koch, MD  Outpatient Specialists: ENT. Patient coming from: SNF.  Chief Complaint: Vision changes.  HPI: [name] [name] is a 76 y.o. female with medical history significant for paroxysmal SVT, hypertension, type 2 diabetes, hearing loss, who presented to [facility] from the ED from SNF due to visual changes.  History is mainly obtained from EDP as the patient is a poor historian.  The patient has been having floaters in her vision for about weeks.  Associated with the room spinning.  Also endorses body spasms.  No subjective fevers or chills.  In the ED, workup revealed UA positive for pyuria with concern for sepsis.  She was started on empiric IV antibiotics and TRH, hospitalist service, was asked to admit.  ED Course: Tmax 97.9.  BP 102/56.  Pulse 80, respiration rate 18, saturation 95% on room air.  Lab studies remarkable for serum bicarb 21, hemoglobin 10.4.  Review of Systems: Review of systems as noted in the HPI. All other systems reviewed and are negative.   Past Medical History:  Diagnosis Date   Acute kidney injury ([place])    Aortic stenosis, mild 11/17/2013   Arthritis    "both knees, spine, back, fingers" (11/29/2013)   CHF (congestive heart failure) (HCC)    Edema    GERD (gastroesophageal reflux disease)    Heart murmur    History of diastolic dysfunction    Echo 12/5619 with diastolic dysfunction   HTN (hypertension)    Morbid obesity (HCC)    OA (osteoarthritis)    PAC (premature atrial contraction)    per prior Holter   Palpitations    Pneumonia    "as a child"   PVC's (premature ventricular contractions)    per prior Holter   SVT (supraventricular tachycardia)    Tachycardia    noted at 07/28/11 visit. Started on beta blocker. Possible atrial flutter vs long PR tachycardia/AVNRT   Type II diabetes mellitus  ([place])    Past Surgical History:  Procedure Laterality Date   BIOPSY  03/23/2020   Procedure: BIOPSY;  Surgeon: Juanita Craver, MD;  Location: [facility];  Service: Endoscopy;;   [place]   ESOPHAGOGASTRODUODENOSCOPY (EGD) WITH PROPOFOL N/A 03/23/2020   Procedure: ESOPHAGOGASTRODUODENOSCOPY (EGD) WITH PROPOFOL;  Surgeon: Juanita Craver, MD;  Location: [facility] END[facility];  Service: Endoscopy;  Laterality: N/A;   SUPRAVENTRICULAR TACHYCARDIA ABLATION  11/29/2013   SUPRAVENTRICULAR TACHYCARDIA ABLATION N/A 11/29/2013   Procedure: SUPRAVENTRICULAR TACHYCARDIA ABLATION;  Surgeon: Evans Lance, MD;  Location: [facility] CATH LAB;  Service: Cardiovascular;  Laterality: N/A;   US ECHOCARDIOGRAPHY  07/25/2008   EF 55-60%   VAGINAL DELIVERY      Social History:  reports that she quit smoking about 36 years ago. Her smoking use included cigarettes. She has a 30.00 pack-year smoking history. She has never used smokeless tobacco. She reports that she does not drink alcohol and does not use drugs.   Allergies  Allergen Reactions   Oxycodone Hcl Nausea And Vomiting   Penicillins Itching    Remote reaction, no anaphylaxis or severe cutaneous symptoms Tolerates Cefepime    Family History  Problem Relation Age of Onset   Alzheimer's disease Mother    Lung cancer Mother    COPD Father    Diabetes Maternal Uncle    Stroke Neg Hx    Colon cancer Neg Hx  Stomach cancer Neg Hx    Esophageal cancer Neg Hx       Prior to Admission medications   Medication Sig Start Date End Date Taking? Authorizing Provider  acetaminophen (TYLENOL) 500 MG tablet Take 1,000 mg by mouth every 8 (eight) hours as needed for mild pain or moderate pain.    [provider]  albuterol (VENTOLIN HFA) 108 (90 Base) MCG/ACT inhaler Inhale 2 puffs into the lungs every 6 (six) hours as needed for wheezing or shortness of breath. Patient not taking: Reported on 02/09/2022 09/17/20   [provider]  apixaban (ELIQUIS) 2.5 MG TABS tablet Take by mouth 2 (two) times daily.    [provider]  benzocaine (ORAJEL) 10 % mucosal gel Use as directed 1 application in the mouth or throat every 6 (six) hours as needed (gum pain/irritation). Patient not taking: Reported on 02/09/2022    [provider]  diclofenac Sodium (VOLTAREN) 1 % GEL Apply 4 g topically 4 (four) times daily. Apply to lower back    [provider]  feeding supplement, ENSURE COMPLETE, (ENSURE COMPLETE) LIQD Take 237 mLs by mouth 2 (two) times daily between meals. Patient not taking: Reported on 02/15/2021 07/28/19   Hoyt Koch, MD  ferrous sulfate 325 (65 FE) MG tablet Take 1 tablet (325 mg total) by mouth daily with breakfast. 02/21/21 03/23/21  Donne Hazel, MD  folic acid (FOLVITE) 1 MG tablet Take 1 mg by mouth daily.    [provider]  glipiZIDE (GLUCOTROL XL) 2.5 MG 24 hr tablet Take 2.5 mg by mouth daily. 02/01/21   [provider]  ketotifen (ZADITOR) 0.025 % ophthalmic solution Place 1 drop into both eyes daily. For glaucoma Patient not taking: Reported on 02/09/2022    [provider]  loperamide (IMODIUM) 2 MG capsule Take 2 mg by mouth every 6 (six) hours as needed for diarrhea or loose stools.    [provider]  metoprolol tartrate (LOPRESSOR) 25 MG tablet Take 1 tablet (25 mg total) by mouth 2 (two) times daily. 02/20/21 02/09/22  Donne Hazel, MD  montelukast (SINGULAIR) 10 MG tablet Take 1 tablet (10 mg total) by mouth at bedtime. 07/28/19   Hoyt Koch, MD  Multiple Vitamin (MULTIVITAMIN WITH MINERALS) TABS tablet Take 1 tablet by mouth daily. 03/26/20   Caren Griffins, MD  omeprazole (PRILOSEC) 20 MG capsule Take 20 mg by mouth 2 (two) times daily. 02/12/21   [provider]  ondansetron (ZOFRAN) 4 MG tablet Take 4 mg by mouth every 8 (eight) hours as needed for nausea or vomiting.    [provider]  oxyCODONE (OXY IR/ROXICODONE) 5 MG immediate release tablet Take 1 tablet (5 mg total) by mouth every 6 (six) hours as needed for moderate pain. 02/20/21   Donne Hazel, MD  promethazine (PHENERGAN) 25 MG suppository Place 1 suppository (25 mg total) rectally every 6 (six) hours as needed for nausea or vomiting. 02/27/20   Hoyt Koch, MD  Propylene Glycol (SYSTANE COMPLETE OP) Apply 1 drop to eye 2 (two) times daily. Patient not taking: Reported on 02/09/2022    [provider]  sertraline (ZOLOFT) 25 MG tablet Take 25 mg by mouth daily. 02/08/21   [provider]  sodium chloride (OCEAN) 0.65 % SOLN nasal spray Place 1 spray into both nostrils every 2 (two) hours as needed for congestion (nasal dryness). Patient not taking: Reported on 02/09/2022    [provider]  sucralfate (CARAFATE) 1 g tablet Take 1 g by mouth 4 (four) times daily. Dissolve tablets 01/28/21   [provider]  thiamine 100 MG tablet Take 1 tablet (100 mg total) by mouth daily. 03/26/20   Caren Griffins, MD    Physical Exam: BP (!) 108/52   Pulse 90   Temp 97.9 F (36.6 C) (Rectal)   Resp (!) 21   SpO2 92%   General: 76 y.o. year-old female well developed well nourished in no acute distress.  Alert and oriented x3. Cardiovascular: Regular rate and rhythm with no rubs or gallops.  No thyromegaly or JVD noted.  No lower extremity edema. 2/4 pulses in all 4 extremities. Respiratory: Clear to auscultation with no wheezes or rales. Good inspiratory effort. Abdomen: Soft nontender nondistended with normal bowel sounds x4 quadrants. Muskuloskeletal: No cyanosis, clubbing or edema noted bilaterally Neuro: CN II-XII intact, strength, sensation, reflexes Skin: No ulcerative lesions noted or rashes Psychiatry: Judgement and insight appear normal. Mood is appropriate for condition and setting          Labs on Admission:  Basic Metabolic Panel: Recent Labs   Lab 03/18/22 1930  NA 141  K 5.1  CL 110  CO2 21*  GLUCOSE 83  BUN 27*  CREATININE 0.89  CALCIUM 9.3   Liver Function Tests: No results for input(s): "AST", "ALT", "ALKPHOS", "BILITOT", "PROT", "ALBUMIN" in the last 168 hours. No results for input(s): "LIPASE", "AMYLASE" in the last 168 hours. No results for input(s): "AMMONIA" in the last 168 hours. CBC: Recent Labs  Lab 03/18/22 1930  WBC 4.9  NEUTROABS 2.9  HGB 10.4*  HCT 34.9*  MCV 86.6  PLT 173   Cardiac Enzymes: No results for input(s): "CKTOTAL", "CKMB", "CKMBINDEX", "TROPONINI" in the last 168 hours.  BNP (last 3 results) No results for input(s): "BNP" in the last 8760 hours.  ProBNP (last 3 results) No results for input(s): "PROBNP" in the last 8760 hours.  CBG: No results for input(s): "GLUCAP" in the last 168 hours.  Radiological Exams on Admission: DG Chest Portable 1 View  Result Date: 03/18/2022 CLINICAL DATA:  Infection EXAM: PORTABLE CHEST 1 VIEW COMPARISON:  02/15/2017 FINDINGS: 2 frontal views of the chest demonstrate an unremarkable cardiac silhouette. There is patchy bibasilar airspace disease. No effusion or pneumothorax. No acute bony abnormalities. IMPRESSION: 1. Patchy bibasilar airspace disease consistent with multifocal bronchopneumonia. Aspiration could be considered in the appropriate setting. Electronically Signed   By: Randa Ngo M.D.   On: 03/18/2022 22:47   MR BRAIN WO CONTRAST  Result Date: 03/18/2022 CLINICAL DATA:  TIA, history of Wernicke's encephalopathy. Reported visual disturbances and vertigo. EXAM: MRI HEAD WITHOUT CONTRAST TECHNIQUE: Multiplanar, multiecho pulse sequences of the brain and surrounding structures were obtained without intravenous contrast. COMPARISON:  Brain MRI 03/18/2020 FINDINGS: Brain: There is no acute intracranial hemorrhage, extra-axial fluid collection, or acute infarct. Parenchymal volume is normal. The ventricles are normal in size. Gray-white  differentiation is preserved. There is a minimal for age burden of small-vessel ischemic change in the supratentorial white matter. There is no evidence of Wernicke's encephalopathy or other acute parenchymal signal abnormality. There is no mass lesion.  There is no mass effect or midline shift. Vascular: Normal flow voids. Skull and upper cervical spine: There is no suspicious marrow signal abnormality. There is degenerative change at C3-C4 resulting in moderate to severe spinal canal stenosis with possible cord compression, similar to 2021. Sinuses/Orbits: There is mild mucosal thickening in the  paranasal sinuses. The globes and orbits are unremarkable. Other: None. IMPRESSION: 1. Unremarkable for age appearance of the brain with no acute intracranial pathology. 2. Degenerative change at C3-C4 resulting in moderate to severe spinal canal stenosis with cord compression, similar to 2021. Electronically Signed   By: Valetta Mole M.D.   On: 03/18/2022 21:28    EKG: I independently viewed the EKG done and my findings are as followed: Sinus pericardia rate of 40.  Nonspecific ST-T changes.  QTc 489.  Assessment/Plan Present on Admission:  Severe sepsis ([place])  Principal Problem:   Severe sepsis ([place])  ?Severe sepsis, suspect multifactorial secondary to pneumonia, UTI, POA Presented with temperature of 91.8, placed on Bair hugger with improvement to 97.9. UA positive for pyuria, patchy bibasilar airspace disease consistent multifocal bronchopneumonia aspiration could be considered in the appropriate setting. No leukocytosis, no tachycardia, no tachypnea Started on empiric IV antibiotic Rocephin and doxycycline, continue. Monitor fever curve and WBC. Follow cultures for ID and sensitivities.  Possible dysphagia Speech therapist evaluation Aspiration precautions.  Iron deficiency anemia Resume home ferrous sulfate  Paroxysmal SVT Resume home Eliquis at home Lopressor  Chronic  anxiety/depression Resume home Zoloft  Type 2 diabetes with hyperglycemia Obtain hemoglobin A1c Hold off home oral hypoglycemics Start insulin sliding scale   DVT prophylaxis: Home Eliquis  Code Status: Full code  Family Communication: None at bedside  Disposition Plan: Admitted to stepdown unit  Consults called: None.  Admission status: Inpatient status.   Status is: Inpatient The patient requires at least 2 midnights for further evaluation and treatment of present condition.   Kayleen Memos MD [facility] Pager [phone]  If 7PM-7AM, please contact night-coverage [email or web address] Password [email or web address]  03/19/2022, 5:46 AM

## 2022-03-18 NOTE — ED Provider Notes (Signed)
[facility] DEPT Provider Note   CSN: [id number] Arrival date & time: 03/18/22  1711     History  Chief Complaint  Patient presents with   Vision Changes     [name] [name] is a 76 y.o. female with a history of acute encephalopathy presenting to the ED from nursing facility with concern for visual disturbances.  The patient reports that she has been noting floaters in her vision, which is been ongoing for several weeks or months, and was evaluated by her ocular doctor in the past.  However she now reports a sensation of "the room is moving, things are floating when I stare at them, and I see shadows."  She denies visual loss.  She denies pain in her eyes.  She says that the symptoms been ongoing about 6 to 7 days.  She is also having body jerks and spasms.  HPI     Home Medications Prior to Admission medications   Medication Sig Start Date End Date Taking? Authorizing Provider  acetaminophen (TYLENOL) 500 MG tablet Take 1,000 mg by mouth every 8 (eight) hours as needed for mild pain or moderate pain.    [provider]  albuterol (VENTOLIN HFA) 108 (90 Base) MCG/ACT inhaler Inhale 2 puffs into the lungs every 6 (six) hours as needed for wheezing or shortness of breath. Patient not taking: Reported on 02/09/2022 09/17/20   [provider]  apixaban (ELIQUIS) 2.5 MG TABS tablet Take by mouth 2 (two) times daily.    [provider]  benzocaine (ORAJEL) 10 % mucosal gel Use as directed 1 application in the mouth or throat every 6 (six) hours as needed (gum pain/irritation). Patient not taking: Reported on 02/09/2022    [provider]  diclofenac Sodium (VOLTAREN) 1 % GEL Apply 4 g topically 4 (four) times daily. Apply to lower back    [provider]  feeding supplement, ENSURE COMPLETE, (ENSURE COMPLETE) LIQD Take 237 mLs by mouth 2 (two) times daily between meals. Patient not taking: Reported on 02/15/2021  07/28/19   Hoyt Koch, MD  ferrous sulfate 325 (65 FE) MG tablet Take 1 tablet (325 mg total) by mouth daily with breakfast. 02/21/21 03/23/21  Donne Hazel, MD  folic acid (FOLVITE) 1 MG tablet Take 1 mg by mouth daily.    [provider]  glipiZIDE (GLUCOTROL XL) 2.5 MG 24 hr tablet Take 2.5 mg by mouth daily. 02/01/21   [provider]  ketotifen (ZADITOR) 0.025 % ophthalmic solution Place 1 drop into both eyes daily. For glaucoma Patient not taking: Reported on 02/09/2022    [provider]  loperamide (IMODIUM) 2 MG capsule Take 2 mg by mouth every 6 (six) hours as needed for diarrhea or loose stools.    [provider]  metoprolol tartrate (LOPRESSOR) 25 MG tablet Take 1 tablet (25 mg total) by mouth 2 (two) times daily. 02/20/21 02/09/22  Donne Hazel, MD  montelukast (SINGULAIR) 10 MG tablet Take 1 tablet (10 mg total) by mouth at bedtime. 07/28/19   Hoyt Koch, MD  Multiple Vitamin (MULTIVITAMIN WITH MINERALS) TABS tablet Take 1 tablet by mouth daily. 03/26/20   Caren Griffins, MD  omeprazole (PRILOSEC) 20 MG capsule Take 20 mg by mouth 2 (two) times daily. 02/12/21   [provider]  ondansetron (ZOFRAN) 4 MG tablet Take 4 mg by mouth every 8 (eight) hours as needed for nausea or vomiting.    [provider]  oxyCODONE (OXY IR/ROXICODONE) 5 MG immediate release tablet Take 1 tablet (5 mg total) by mouth every 6 (six) hours as needed for moderate pain. 02/20/21   Donne Hazel, MD  promethazine (PHENERGAN) 25 MG suppository Place 1 suppository (25 mg total) rectally every 6 (six) hours as needed for nausea or vomiting. 02/27/20   Hoyt Koch, MD  Propylene Glycol (SYSTANE COMPLETE OP) Apply 1 drop to eye 2 (two) times daily. Patient not taking: Reported on 02/09/2022    [provider]  sertraline (ZOLOFT) 25 MG tablet Take 25 mg by mouth daily. 02/08/21   [provider]  sodium  chloride (OCEAN) 0.65 % SOLN nasal spray Place 1 spray into both nostrils every 2 (two) hours as needed for congestion (nasal dryness). Patient not taking: Reported on 02/09/2022    [provider]  sucralfate (CARAFATE) 1 g tablet Take 1 g by mouth 4 (four) times daily. Dissolve tablets 01/28/21   [provider]  thiamine 100 MG tablet Take 1 tablet (100 mg total) by mouth daily. 03/26/20   Caren Griffins, MD      Allergies    Oxycodone hcl and Penicillins    Review of Systems   Review of Systems  Physical Exam Updated Vital Signs BP 107/60   Pulse (!) 41   Temp (!) 91.8 F (33.2 C) (Rectal)   Resp 12   SpO2 100%  Physical Exam Constitutional:      General: She is not in acute distress. HENT:     Head: Normocephalic and atraumatic.  Eyes:     Conjunctiva/sclera: Conjunctivae normal.     Pupils: Pupils are equal, round, and reactive to light.  Cardiovascular:     Rate and Rhythm: Normal rate and regular rhythm.  Pulmonary:     Effort: Pulmonary effort is normal. No respiratory distress.  Abdominal:     General: There is no distension.     Tenderness: There is no abdominal tenderness.  Skin:    General: Skin is warm and dry.  Neurological:     General: No focal deficit present.     Mental Status: She is alert and oriented to person, place, and time. Mental status is at baseline.     Comments: No visual deficit, peripheral vision is intact,, vision 20/30 both eyes  Psychiatric:        Mood and Affect: Mood normal.        Behavior: Behavior normal.     ED Results / Procedures / Treatments   Labs (all labs ordered are listed, but only abnormal results are displayed) Labs Reviewed  BASIC METABOLIC PANEL - Abnormal; Notable for the following components:      Result Value   CO2 21 (*)    BUN 27 (*)    All other components within normal limits  CBC WITH DIFFERENTIAL/PLATELET - Abnormal; Notable for the following components:   Hemoglobin 10.4 (*)     HCT 34.9 (*)    MCH 25.8 (*)    MCHC 29.8 (*)    RDW 17.0 (*)    All other components within normal limits  URINALYSIS, ROUTINE W REFLEX MICROSCOPIC - Abnormal; Notable for the following components:   APPearance HAZY (*)    Nitrite POSITIVE (*)    Bacteria, UA RARE (*)    All other components within normal limits  CULTURE, BLOOD (ROUTINE X 2)  CULTURE, BLOOD (ROUTINE X 2)  RESP PANEL BY RT-PCR (RSV, FLU A&B, COVID)  RVPGX2  URINE CULTURE  TSH  T4, FREE  LACTIC ACID, PLASMA  LACTIC ACID, PLASMA  TROPONIN I (HIGH SENSITIVITY)    EKG EKG Interpretation  Date/Time:  Wednesday March 18 2022 22:27:38 EST Ventricular Rate:  40 PR Interval:  326 QRS Duration: 142 QT Interval:  599 QTC Calculation: 489 R Axis:   -43 Text Interpretation: Sinus bradycardia Prolonged PR interval Left bundle branch block Confirmed by Octaviano Glow [phone]) on 03/18/2022 10:33:41 PM  Radiology DG Chest Portable 1 View  Result Date: 03/18/2022 CLINICAL DATA:  Infection EXAM: PORTABLE CHEST 1 VIEW COMPARISON:  02/15/2017 FINDINGS: 2 frontal views of the chest demonstrate an unremarkable cardiac silhouette. There is patchy bibasilar airspace disease. No effusion or pneumothorax. No acute bony abnormalities. IMPRESSION: 1. Patchy bibasilar airspace disease consistent with multifocal bronchopneumonia. Aspiration could be considered in the appropriate setting. Electronically Signed   By: Randa Ngo M.D.   On: 03/18/2022 22:47   MR BRAIN WO CONTRAST  Result Date: 03/18/2022 CLINICAL DATA:  TIA, history of Wernicke's encephalopathy. Reported visual disturbances and vertigo. EXAM: MRI HEAD WITHOUT CONTRAST TECHNIQUE: Multiplanar, multiecho pulse sequences of the brain and surrounding structures were obtained without intravenous contrast. COMPARISON:  Brain MRI 03/18/2020 FINDINGS: Brain: There is no acute intracranial hemorrhage, extra-axial fluid collection, or acute infarct. Parenchymal volume is  normal. The ventricles are normal in size. Gray-white differentiation is preserved. There is a minimal for age burden of small-vessel ischemic change in the supratentorial white matter. There is no evidence of Wernicke's encephalopathy or other acute parenchymal signal abnormality. There is no mass lesion.  There is no mass effect or midline shift. Vascular: Normal flow voids. Skull and upper cervical spine: There is no suspicious marrow signal abnormality. There is degenerative change at C3-C4 resulting in moderate to severe spinal canal stenosis with possible cord compression, similar to 2021. Sinuses/Orbits: There is mild mucosal thickening in the paranasal sinuses. The globes and orbits are unremarkable. Other: None. IMPRESSION: 1. Unremarkable for age appearance of the brain with no acute intracranial pathology. 2. Degenerative change at C3-C4 resulting in moderate to severe spinal canal stenosis with cord compression, similar to 2021. Electronically Signed   By: Valetta Mole M.D.   On: 03/18/2022 21:28    Procedures .Critical Care  Performed by: Wyvonnia Dusky, MD Authorized by: Wyvonnia Dusky, MD   Critical care provider statement:    Critical care time (minutes):  45   Critical care time was exclusive of:  Separately billable procedures and treating other patients   Critical care was necessary to treat or prevent imminent or life-threatening deterioration of the following conditions:  Sepsis   Critical care was time spent personally by me on the following activities:  Ordering and performing treatments and interventions, ordering and review of laboratory studies, ordering and review of radiographic studies, pulse oximetry, review of old charts, examination of patient and evaluation of patient's response to treatment   Care discussed with: admitting provider       Medications Ordered in ED Medications  cefTRIAXone (ROCEPHIN) 1 g in sodium chloride 0.9 % 100 mL IVPB (has no  administration in time range)  doxycycline (VIBRA-TABS) tablet 100 mg (has no administration in time range)    ED Course/ Medical Decision Making/ A&P Clinical Course as of 03/18/22 2329  Wed Mar 18, 2022  2230 No acute findings on MRI.  However upon returning to the ED she was noted to be calm suddenly significantly hypothermic with a rectal temp 91.8.  He continues to have the same resting bradycardia with a heart rate in the low 40s and regular.  Repeat EKG does not show signs of developed heart block.  I wonder whether there may be some thyroid dysfunction we will check his status as well as blood cultures, UA, and infection workup.  The patient reports that she feels "cold now, but I always feel cold". [MT]  2231 Repeat EKG shows sinus bradycardia with a heart rate of 40, first-degree heart block with prolonged PR interval, but I do not see high-grade heart block. [MT]  2259 I spoke to ICU attending Dr Patsey Berthold, who agrees with workup for both infection, sepsis, and thyroid function.  At this time as the patient's blood pressure is stable, there would not be an emergent indication for ICU admission.  Patient would need stepdown admission as she is on Bair hugger, hypothermic, and would also need telemetry monitoring for bradycardia.  I suspect bradycardia secondary to infection and hypothermia, not an arrhythmia or heart block.   [MT]    Clinical Course User Index [MT] Cyndi Montejano, Carola Rhine, MD                           Medical Decision Making Amount and/or Complexity of Data Reviewed Labs: ordered. Radiology: ordered. ECG/medicine tests: ordered.  Risk Prescription drug management. Decision regarding hospitalization.   This patient presents to the ED with concern for visual changes, body spasms. This involves an extensive number of treatment options, and is a complaint that carries with it a high risk of complications and morbidity.  The differential diagnosis includes progressing  Warnicke's disease versus new occipital or intracranial lesion versus metabolic derangement versus other  I have a low suspicion for retinal detachment or CRAO or CRVO clinically.  She has no visual loss and her ocular exam is otherwise benign.  I doubt acute angle-closure glaucoma  Additional history obtained from EMS  External records from outside source obtained and reviewed including hospitalization discharge from November 2022, 1 year ago, at that time for sepsis, pseudogout, protein calorie malnutrition, patient noted to have a history of Warnicke's encephalopathy.  I ordered and personally interpreted labs.  The pertinent results include: CBC and BMP with no acute hypoglycemia, wbc wnl, hgb near baseline anemia levels.  UA is +nitrites - will send for culture Rocephin should provide cross coverage for UTI and PNA  Thyroid studies and troponin add on, pending.  I ordered imaging studies including MRI brain, dg chest I independently visualized and interpreted imaging which showed no acute stroke, bronchopneumonia pattern noted on chest x-ray I agree with the radiologist interpretation  The patient was maintained on a cardiac monitor.  I personally viewed and interpreted the cardiac monitored which showed an underlying rhythm of: Regular sinus bradycardia  Per my interpretation the patient's ECG shows baseline shaking artifact but otherwise unremarkable, sinus bradycardia  After the interventions noted above, I reevaluated the patient and found that they have: stayed the same  Dispostion:  After consideration of the diagnostic results and the patients response to treatment, I feel that the patent would benefit from medical admission.         Final Clinical Impression(s) / ED Diagnoses Final diagnoses:  Sepsis, due to unspecified organism, unspecified whether acute organ dysfunction present ([place])  Hypothermia, initial encounter  Bradycardia    Rx / DC Orders ED  Discharge Orders     None  Wyvonnia Dusky, MD 03/18/22 2330

## 2022-03-18 NOTE — ED Notes (Signed)
IV access attempted x3 w/o success.

## 2022-03-18 NOTE — ED Triage Notes (Addendum)
Patient BIB [facility] from [facility]. Patient said she is having visual disturbances. She is seeing black specks on walls/floors, like floaters. She said she looks at a still object, but it moves. This has been going on for 7 days. Then in the last month increased body spasms where her whole body jerks.

## 2022-03-19 DIAGNOSIS — J189 Pneumonia, unspecified organism: Secondary | ICD-10-CM

## 2022-03-19 DIAGNOSIS — T68XXXA Hypothermia, initial encounter: Secondary | ICD-10-CM

## 2022-03-19 LAB — BASIC METABOLIC PANEL
Anion gap: 6 (ref 5–15)
BUN: 27 mg/dL — ABNORMAL HIGH (ref 8–23)
CO2: 27 mmol/L (ref 22–32)
Calcium: 9.3 mg/dL (ref 8.9–10.3)
Chloride: 110 mmol/L (ref 98–111)
Creatinine, Ser: 0.91 mg/dL (ref 0.44–1.00)
GFR, Estimated: >60 mL/min (ref >60)
Glucose, Bld: 75 mg/dL (ref 70–99)
Potassium: 4.5 mmol/L (ref 3.5–5.1)
Sodium: 143 mmol/L (ref 135–145)

## 2022-03-19 LAB — LACTIC ACID, PLASMA: Lactic Acid, Venous: 1.6 mmol/L (ref 0.5–1.9)

## 2022-03-19 LAB — BLOOD CULTURE ID PANEL (REFLEXED) - BCID2

## 2022-03-19 LAB — GLUCOSE, CAPILLARY
Glucose-Capillary: 104 mg/dL — ABNORMAL HIGH (ref 70–99)
Glucose-Capillary: 122 mg/dL — ABNORMAL HIGH (ref 70–99)

## 2022-03-19 LAB — CBC
HCT: 32 % — ABNORMAL LOW (ref 36.0–46.0)
Hemoglobin: 9.9 g/dL — ABNORMAL LOW (ref 12.0–15.0)
MCH: 26.1 pg (ref 26.0–34.0)
MCHC: 30.9 g/dL (ref 30.0–36.0)
MCV: 84.4 fL (ref 80.0–100.0)
Platelets: 200 10*3/uL (ref 150–400)
RBC: 3.79 MIL/uL — ABNORMAL LOW (ref 3.87–5.11)
RDW: 16.8 % — ABNORMAL HIGH (ref 11.5–15.5)
WBC: 3.5 10*3/uL — ABNORMAL LOW (ref 4.0–10.5)
nRBC: 0 % (ref 0.0–0.2)

## 2022-03-19 LAB — TROPONIN I (HIGH SENSITIVITY)
Troponin I (High Sensitivity): 4 ng/L (ref <18)
Troponin I (High Sensitivity): 5 ng/L (ref <18)

## 2022-03-19 LAB — MAGNESIUM: Magnesium: 1.9 mg/dL (ref 1.7–2.4)

## 2022-03-19 LAB — CBG MONITORING, ED
Glucose-Capillary: 78 mg/dL (ref 70–99)
Glucose-Capillary: 114 mg/dL — ABNORMAL HIGH (ref 70–99)
Glucose-Capillary: 114 mg/dL — ABNORMAL HIGH (ref 70–99)

## 2022-03-19 LAB — T4, FREE: Free T4: 0.89 ng/dL (ref 0.61–1.12)

## 2022-03-19 LAB — PHOSPHORUS: Phosphorus: 3.7 mg/dL (ref 2.5–4.6)

## 2022-03-19 LAB — TSH: TSH: 3.857 u[IU]/mL (ref 0.350–4.500)

## 2022-03-19 MED ORDER — ENOXAPARIN SODIUM 40 MG/0.4ML IJ SOSY: 40.0000 mg | PREFILLED_SYRINGE | INTRAMUSCULAR | Status: DC

## 2022-03-19 MED ORDER — MONTELUKAST SODIUM 10 MG PO TABS
10.0000 mg | ORAL_TABLET | Freq: Every day | ORAL | Status: DC
  Administered 2022-03-19 – 2022-03-24 (×6): 10 mg via ORAL
  Filled 2022-03-19 (×6): qty 1

## 2022-03-19 MED ORDER — FERROUS SULFATE 325 (65 FE) MG PO TABS
325.0000 mg | ORAL_TABLET | Freq: Every day | ORAL | Status: DC
  Administered 2022-03-20 – 2022-03-25 (×6): 325 mg via ORAL
  Filled 2022-03-19 (×7): qty 1

## 2022-03-19 MED ORDER — FOLIC ACID 1 MG PO TABS
1.0000 mg | ORAL_TABLET | Freq: Every day | ORAL | Status: DC
  Administered 2022-03-19 – 2022-03-25 (×7): 1 mg via ORAL
  Filled 2022-03-19 (×7): qty 1

## 2022-03-19 MED ORDER — DOXYCYCLINE HYCLATE 100 MG PO TABS
100.0000 mg | ORAL_TABLET | Freq: Two times a day (BID) | ORAL | Status: AC
  Administered 2022-03-19 – 2022-03-22 (×8): 100 mg via ORAL
  Filled 2022-03-19 (×8): qty 1

## 2022-03-19 MED ORDER — PANTOPRAZOLE SODIUM 40 MG PO TBEC
40.0000 mg | DELAYED_RELEASE_TABLET | Freq: Every day | ORAL | Status: DC
  Administered 2022-03-19 – 2022-03-25 (×7): 40 mg via ORAL
  Filled 2022-03-19 (×7): qty 1

## 2022-03-19 MED ORDER — SERTRALINE HCL 50 MG PO TABS: 25.0000 mg | ORAL_TABLET | Freq: Every day | ORAL | Status: DC

## 2022-03-19 MED ORDER — APIXABAN 2.5 MG PO TABS
2.5000 mg | ORAL_TABLET | Freq: Two times a day (BID) | ORAL | Status: DC
  Administered 2022-03-19 – 2022-03-25 (×13): 2.5 mg via ORAL
  Filled 2022-03-19 (×13): qty 1

## 2022-03-19 MED ORDER — APIXABAN 2.5 MG PO TABS: 2.5000 mg | ORAL_TABLET | Freq: Two times a day (BID) | ORAL | Status: DC

## 2022-03-19 MED ORDER — INSULIN ASPART 100 UNIT/ML IJ SOLN
0.0000 [IU] | Freq: Three times a day (TID) | INTRAMUSCULAR | Status: DC
  Administered 2022-03-20 – 2022-03-24 (×7): 1 [IU] via SUBCUTANEOUS
  Administered 2022-03-25: 2 [IU] via SUBCUTANEOUS
  Filled 2022-03-19: qty 0.09

## 2022-03-19 MED ORDER — SODIUM CHLORIDE 0.9 % IV SOLN
2.0000 g | INTRAVENOUS | Status: AC
  Administered 2022-03-19 – 2022-03-22 (×4): 2 g via INTRAVENOUS
  Filled 2022-03-19 (×4): qty 20

## 2022-03-19 MED ORDER — INSULIN ASPART 100 UNIT/ML IJ SOLN
0.0000 [IU] | Freq: Every day | INTRAMUSCULAR | Status: DC
  Filled 2022-03-19: qty 0.05

## 2022-03-19 MED ORDER — ADULT MULTIVITAMIN W/MINERALS CH
1.0000 | ORAL_TABLET | Freq: Every day | ORAL | Status: DC
  Administered 2022-03-19 – 2022-03-25 (×7): 1 via ORAL
  Filled 2022-03-19 (×7): qty 1

## 2022-03-19 MED ORDER — SERTRALINE HCL 25 MG PO TABS
25.0000 mg | ORAL_TABLET | Freq: Every day | ORAL | Status: DC
  Administered 2022-03-19 – 2022-03-25 (×7): 25 mg via ORAL
  Filled 2022-03-19 (×7): qty 1

## 2022-03-19 MED ORDER — THIAMINE MONONITRATE 100 MG PO TABS
100.0000 mg | ORAL_TABLET | Freq: Every day | ORAL | Status: DC
  Administered 2022-03-19 – 2022-03-25 (×7): 100 mg via ORAL
  Filled 2022-03-19 (×7): qty 1

## 2022-03-19 NOTE — ED Notes (Signed)
Meds given with apple sauce, alert, NAD, calm, interactive.

## 2022-03-19 NOTE — ED Notes (Signed)
Attempted to get oral temperature on pt without success, got a rectal temperature 91.8 placed on Baire hugger and provider made aware

## 2022-03-19 NOTE — Progress Notes (Signed)
PHARMACY - PHYSICIAN COMMUNICATION CRITICAL VALUE ALERT - BLOOD CULTURE IDENTIFICATION (BCID)  [name] [name] is an 76 y.o. female who presented to [facility] on 03/18/2022 with a chief complaint of  Chief Complaint  Patient presents with   Vision Changes      Assessment:  SIRS:  From multifocal pneumonia and UTI  Name of physician (or Provider) Contacted: Raenette Rover, NP   Current antibiotics: Ceftriaxone and doxycycline  Changes to prescribed antibiotics recommended:  - No changes needed - Staph epi in 1/4 likely contaminant    Results for orders placed or performed during the hospital encounter of 03/18/22  Blood Culture ID Panel (Reflexed) (Collected: 03/18/2022 10:50 PM)  Result Value Ref Range   Enterococcus faecalis NOT DETECTED NOT DETECTED   Enterococcus Faecium NOT DETECTED NOT DETECTED   Listeria monocytogenes NOT DETECTED NOT DETECTED   Staphylococcus species DETECTED (A) NOT DETECTED   Staphylococcus aureus (BCID) NOT DETECTED NOT DETECTED   Staphylococcus epidermidis DETECTED (A) NOT DETECTED   Staphylococcus lugdunensis NOT DETECTED NOT DETECTED   Streptococcus species NOT DETECTED NOT DETECTED   Streptococcus agalactiae NOT DETECTED NOT DETECTED   Streptococcus pneumoniae NOT DETECTED NOT DETECTED   Streptococcus pyogenes NOT DETECTED NOT DETECTED   A.calcoaceticus-baumannii NOT DETECTED NOT DETECTED   Bacteroides fragilis NOT DETECTED NOT DETECTED   Enterobacterales NOT DETECTED NOT DETECTED   Enterobacter cloacae complex NOT DETECTED NOT DETECTED   Escherichia coli NOT DETECTED NOT DETECTED   Klebsiella aerogenes NOT DETECTED NOT DETECTED   Klebsiella oxytoca NOT DETECTED NOT DETECTED   Klebsiella pneumoniae NOT DETECTED NOT DETECTED   Proteus species NOT DETECTED NOT DETECTED   Salmonella species NOT DETECTED NOT DETECTED   Serratia marcescens NOT DETECTED NOT DETECTED   Haemophilus influenzae NOT DETECTED NOT DETECTED   Neisseria meningitidis  NOT DETECTED NOT DETECTED   Pseudomonas aeruginosa NOT DETECTED NOT DETECTED   Stenotrophomonas maltophilia NOT DETECTED NOT DETECTED   Candida albicans NOT DETECTED NOT DETECTED   Candida auris NOT DETECTED NOT DETECTED   Candida glabrata NOT DETECTED NOT DETECTED   Candida krusei NOT DETECTED NOT DETECTED   Candida parapsilosis NOT DETECTED NOT DETECTED   Candida tropicalis NOT DETECTED NOT DETECTED   Cryptococcus neoformans/gattii NOT DETECTED NOT DETECTED   Methicillin resistance mecA/C NOT DETECTED NOT DETECTED    [name] [name] 03/19/2022  9:29 PM

## 2022-03-19 NOTE — ED Notes (Signed)
Pt had bowel movement changed brief and purewick, then obtained rectal temp 97.9, pt taken off baire hugger and warm blankets applied

## 2022-03-19 NOTE — Progress Notes (Signed)
[facility]                                                                               [name] [name], is a 76 y.o. female, DOB - 1945/09/07, [id number] Admit date - 03/18/2022    Outpatient Primary MD for the patient is Hoyt Koch, MD  LOS - 1  days    Brief summary     [name] [name] is a 76 y.o. female with medical history significant for paroxysmal SVT, hypertension, type 2 diabetes, hearing loss, who presented to [facility] from the ED from SNF due to visual changes.  History is mainly obtained from EDP as the patient is a poor historian.  The patient has been having floaters in her vision for about weeks. In the ED, workup revealed UA positive for pyuria with concern for sepsis.  She was started on empiric IV antibiotics and TRH, hospitalist service, was asked to admit. CXR showing multifocal pneumonia.   Assessment & Plan    Assessment and Plan:  SIRS:  From multifocal pneumonia and UTI:  Follow up urine cultures and blood cultures.  Started her on broad spectrum IV antibiotics.  Get sputum cultures if able.  [place] oxygen to keep sats greater than 90%.  SLP eval for possible aspiration and dysphagia.  Lactic acid wnl. RSV , covid19 PCR and influenza PCR is negative.    Paroxysmal SVT:  Resume lopressor.    Iron deficiency anemia/ anemia of chronic disease: . Normocytic.     Type 2 DM WITH hyperglycemia:  CBG (last 3)  Recent Labs    03/19/22 0821 03/19/22 1203  GLUCAP 78 114*  114*   Resume SSI.  Hemoglobin A1c is pending.     Chronic anxiety and depression:  Resume zoloft.         Estimated body mass index is 29.85 kg/m as calculated from the following:   Height as of this encounter: '5\' 11"'$  (1.803 m).   Weight as of this encounter: 97.1 kg.  Code Status: full code.  DVT Prophylaxis:  apixaban (ELIQUIS) tablet 2.5 mg Start: 03/19/22 1500 apixaban (ELIQUIS) tablet 2.5 mg   Level of Care: Level of care:  Progressive Family Communication: none at bedside.   Disposition Plan:     Remains inpatient appropriate:  IV antibiotics.   Procedures:  None.   Consultants:   None.   Antimicrobials:   Anti-infectives (From admission, onward)    Start     Dose/Rate Route Frequency Ordered Stop   03/19/22 2359  cefTRIAXone (ROCEPHIN) 2 g in sodium chloride 0.9 % 100 mL IVPB        2 g 200 mL/hr over 30 Minutes Intravenous Every 24 hours 03/19/22 0545     03/19/22 1000  doxycycline (VIBRA-TABS) tablet 100 mg        100 mg Oral Every 12 hours 03/19/22 0545     03/18/22 2300  cefTRIAXone (ROCEPHIN) 1 g in sodium chloride 0.9 % 100 mL IVPB        1 g 200 mL/hr over 30 Minutes Intravenous  Once 03/18/22 2259 03/19/22 0401   03/18/22 2300  doxycycline (VIBRA-TABS) tablet 100 mg  100 mg Oral  Once 03/18/22 2259 03/18/22 2358        Medications  Scheduled Meds:  apixaban  2.5 mg Oral BID   doxycycline  100 mg Oral Q12H   ferrous sulfate  325 mg Oral Q breakfast   folic acid  1 mg Oral Daily   insulin aspart  0-5 Units Subcutaneous QHS   insulin aspart  0-9 Units Subcutaneous TID WC   montelukast  10 mg Oral QHS   multivitamin with minerals  1 tablet Oral Daily   pantoprazole  40 mg Oral Daily   sertraline  25 mg Oral Daily   thiamine  100 mg Oral Daily   Continuous Infusions:  cefTRIAXone (ROCEPHIN)  IV     PRN Meds:.    Subjective:   [name] [name] was seen and examined today.  Feeling okay, no chest pain. Breathing better, no cough.  No nausea, vomiting. Dysuria present.   Objective:   Vitals:   03/19/22 1347 03/19/22 1355 03/19/22 1450 03/19/22 1500  BP:   123/61 105/66  Pulse:   70 65  Resp:   (!) 23 20  Temp:  (!) 97.2 F (36.2 C)    TempSrc:  Rectal    SpO2:   99% 99%  Weight: 97.1 kg     Height: '5\' 11"'$  (1.803 m)      No intake or output data in the 24 hours ending 03/19/22 1555 Filed Weights   03/19/22 1347  Weight: 97.1 kg     Exam General exam:  Appears calm and comfortable  Respiratory system: Clear to auscultation. Respiratory effort normal. Cardiovascular system: S1 & S2 heard, RRR. No JVD,  No pedal edema. Gastrointestinal system: Abdomen is nondistended, soft and nontender.  Normal bowel sounds heard. Central nervous system: Alert and oriented. No focal neurological deficits. Extremities: Symmetric 5 x 5 power. Skin: No rashes, lesions or ulcers Psychiatry:  Mood & affect appropriate.    Data Reviewed:  I have personally reviewed following labs and imaging studies   CBC Lab Results  Component Value Date   WBC 3.5 (L) 03/19/2022   RBC 3.79 (L) 03/19/2022   HGB 9.9 (L) 03/19/2022   HCT 32.0 (L) 03/19/2022   MCV 84.4 03/19/2022   MCH 26.1 03/19/2022   PLT 200 03/19/2022   MCHC 30.9 03/19/2022   RDW 16.8 (H) 03/19/2022   LYMPHSABS 1.7 03/18/2022   MONOABS 0.2 03/18/2022   EOSABS 0.1 03/18/2022   BASOSABS 0.0 97/35/3299     Last metabolic panel Lab Results  Component Value Date   NA 143 03/19/2022   K 4.5 03/19/2022   CL 110 03/19/2022   CO2 27 03/19/2022   BUN 27 (H) 03/19/2022   CREATININE 0.91 03/19/2022   GLUCOSE 75 03/19/2022   GFRNONAA >60 03/19/2022   GFRAA 41 (L) 01/09/2019   CALCIUM 9.3 03/19/2022   PHOS 3.7 03/19/2022   PROT 6.4 (L) 02/16/2021   ALBUMIN 2.2 (L) 02/16/2021   BILITOT 0.6 02/16/2021   ALKPHOS 82 02/16/2021   AST 16 02/16/2021   ALT 17 02/16/2021   ANIONGAP 6 03/19/2022    CBG (last 3)  Recent Labs    03/19/22 0821 03/19/22 1203  GLUCAP 78 114*  114*      Coagulation Profile: No results for input(s): "INR", "PROTIME" in the last 168 hours.   Radiology Studies: DG Chest Portable 1 View  Result Date: 03/18/2022 CLINICAL DATA:  Infection EXAM: PORTABLE CHEST 1 VIEW COMPARISON:  02/15/2017 FINDINGS: 2 frontal  views of the chest demonstrate an unremarkable cardiac silhouette. There is patchy bibasilar airspace disease. No effusion or pneumothorax. No acute bony  abnormalities. IMPRESSION: 1. Patchy bibasilar airspace disease consistent with multifocal bronchopneumonia. Aspiration could be considered in the appropriate setting. Electronically Signed   By: Randa Ngo M.D.   On: 03/18/2022 22:47   MR BRAIN WO CONTRAST  Result Date: 03/18/2022 CLINICAL DATA:  TIA, history of Wernicke's encephalopathy. Reported visual disturbances and vertigo. EXAM: MRI HEAD WITHOUT CONTRAST TECHNIQUE: Multiplanar, multiecho pulse sequences of the brain and surrounding structures were obtained without intravenous contrast. COMPARISON:  Brain MRI 03/18/2020 FINDINGS: Brain: There is no acute intracranial hemorrhage, extra-axial fluid collection, or acute infarct. Parenchymal volume is normal. The ventricles are normal in size. Gray-white differentiation is preserved. There is a minimal for age burden of small-vessel ischemic change in the supratentorial white matter. There is no evidence of Wernicke's encephalopathy or other acute parenchymal signal abnormality. There is no mass lesion.  There is no mass effect or midline shift. Vascular: Normal flow voids. Skull and upper cervical spine: There is no suspicious marrow signal abnormality. There is degenerative change at C3-C4 resulting in moderate to severe spinal canal stenosis with possible cord compression, similar to 2021. Sinuses/Orbits: There is mild mucosal thickening in the paranasal sinuses. The globes and orbits are unremarkable. Other: None. IMPRESSION: 1. Unremarkable for age appearance of the brain with no acute intracranial pathology. 2. Degenerative change at C3-C4 resulting in moderate to severe spinal canal stenosis with cord compression, similar to 2021. Electronically Signed   By: Valetta Mole M.D.   On: 03/18/2022 21:28       Hosie Poisson M.D. [facility] 03/19/2022, 3:55 PM  Available via Epic secure chat 7am-7pm After 7 pm, please refer to night coverage provider listed on amion.

## 2022-03-19 NOTE — Evaluation (Signed)
SLP Cancellation Note  Patient Details Name: [name] [name] MRN: [id number] DOB: 1945/05/30   Cancelled treatment:       Reason Eval/Treat Not Completed: Other (comment) (order for swallow evaluation received, apologize for delay in services- will conduct 03/20/2022)  Kathleen Lime, MS [place] Office [phone] Pager [phone]   [name] 03/19/2022, 3:40 PM

## 2022-03-20 DIAGNOSIS — R6520 Severe sepsis without septic shock: Secondary | ICD-10-CM | POA: Diagnosis not present

## 2022-03-20 DIAGNOSIS — T68XXXA Hypothermia, initial encounter: Secondary | ICD-10-CM | POA: Diagnosis not present

## 2022-03-20 DIAGNOSIS — A419 Sepsis, unspecified organism: Secondary | ICD-10-CM | POA: Diagnosis not present

## 2022-03-20 DIAGNOSIS — J189 Pneumonia, unspecified organism: Secondary | ICD-10-CM | POA: Diagnosis not present

## 2022-03-20 LAB — CULTURE, BLOOD (ROUTINE X 2)

## 2022-03-20 LAB — CBC WITH DIFFERENTIAL/PLATELET
Abs Immature Granulocytes: 0 10*3/uL (ref 0.00–0.07)
Basophils Absolute: 0 10*3/uL (ref 0.0–0.1)
Basophils Relative: 1 %
Eosinophils Absolute: 0.1 10*3/uL (ref 0.0–0.5)
Eosinophils Relative: 2 %
HCT: 34.8 % — ABNORMAL LOW (ref 36.0–46.0)
Hemoglobin: 10.5 g/dL — ABNORMAL LOW (ref 12.0–15.0)
Immature Granulocytes: 0 %
Lymphocytes Relative: 42 %
Lymphs Abs: 1.5 10*3/uL (ref 0.7–4.0)
MCH: 25.8 pg — ABNORMAL LOW (ref 26.0–34.0)
MCHC: 30.2 g/dL (ref 30.0–36.0)
MCV: 85.5 fL (ref 80.0–100.0)
Monocytes Absolute: 0.3 10*3/uL (ref 0.1–1.0)
Monocytes Relative: 8 %
Neutro Abs: 1.8 10*3/uL (ref 1.7–7.7)
Neutrophils Relative %: 47 %
Platelets: 214 10*3/uL (ref 150–400)
RBC: 4.07 MIL/uL (ref 3.87–5.11)
RDW: 17 % — ABNORMAL HIGH (ref 11.5–15.5)
WBC: 3.7 10*3/uL — ABNORMAL LOW (ref 4.0–10.5)
nRBC: 0 % (ref 0.0–0.2)

## 2022-03-20 LAB — GLUCOSE, CAPILLARY
Glucose-Capillary: 74 mg/dL (ref 70–99)
Glucose-Capillary: 137 mg/dL — ABNORMAL HIGH (ref 70–99)
Glucose-Capillary: 94 mg/dL (ref 70–99)
Glucose-Capillary: 113 mg/dL — ABNORMAL HIGH (ref 70–99)

## 2022-03-20 LAB — BASIC METABOLIC PANEL
Anion gap: 9 (ref 5–15)
BUN: 30 mg/dL — ABNORMAL HIGH (ref 8–23)
CO2: 24 mmol/L (ref 22–32)
Calcium: 9.3 mg/dL (ref 8.9–10.3)
Chloride: 110 mmol/L (ref 98–111)
Creatinine, Ser: 0.93 mg/dL (ref 0.44–1.00)
GFR, Estimated: >60 mL/min (ref >60)
Glucose, Bld: 91 mg/dL (ref 70–99)
Potassium: 4.9 mmol/L (ref 3.5–5.1)
Sodium: 143 mmol/L (ref 135–145)

## 2022-03-20 MED ORDER — HYDROXYZINE HCL 25 MG PO TABS
25.0000 mg | ORAL_TABLET | Freq: Four times a day (QID) | ORAL | Status: DC | PRN
  Administered 2022-03-23: 25 mg via ORAL
  Filled 2022-03-20: qty 1

## 2022-03-20 MED ORDER — HYDROCORTISONE 1 % EX LOTN: 1.0000 | TOPICAL_LOTION | Freq: Four times a day (QID) | CUTANEOUS | Status: DC | PRN

## 2022-03-20 MED ORDER — POLYVINYL ALCOHOL 1.4 % OP SOLN
1.0000 [drp] | Freq: Two times a day (BID) | OPHTHALMIC | Status: DC
  Administered 2022-03-20 – 2022-03-25 (×10): 1 [drp] via OPHTHALMIC
  Filled 2022-03-20 (×2): qty 15

## 2022-03-20 MED ORDER — ALBUTEROL SULFATE (2.5 MG/3ML) 0.083% IN NEBU: 2.5000 mg | INHALATION_SOLUTION | Freq: Four times a day (QID) | RESPIRATORY_TRACT | Status: DC | PRN

## 2022-03-20 MED ORDER — BOOST PLUS PO LIQD
237.0000 mL | Freq: Two times a day (BID) | ORAL | Status: DC
  Administered 2022-03-20 – 2022-03-25 (×10): 237 mL via ORAL
  Filled 2022-03-20 (×11): qty 237

## 2022-03-20 MED ORDER — ENSURE ENLIVE PO LIQD: 237.0000 mL | Freq: Two times a day (BID) | ORAL | Status: DC

## 2022-03-20 MED ORDER — SALINE SPRAY 0.65 % NA SOLN: 1.0000 | NASAL | Status: DC | PRN

## 2022-03-20 MED ORDER — HYPROMELLOSE (GONIOSCOPIC) 2.5 % OP SOLN: 1.0000 [drp] | Freq: Four times a day (QID) | OPHTHALMIC | Status: DC | PRN

## 2022-03-20 MED ORDER — LORATADINE 10 MG PO TABS
10.0000 mg | ORAL_TABLET | Freq: Every day | ORAL | Status: DC
  Administered 2022-03-20 – 2022-03-25 (×6): 10 mg via ORAL
  Filled 2022-03-20 (×6): qty 1

## 2022-03-20 NOTE — Progress Notes (Signed)
[facility]                                                                               [name] [name], is a 76 y.o. female, DOB - February 17, 1946, XIP:[id number] Admit date - 03/18/2022    Outpatient Primary MD for the patient is Hoyt Koch, MD  LOS - 2  days    Brief summary     [name] [name] is a 76 y.o. female with medical history significant for paroxysmal SVT, hypertension, type 2 diabetes, hearing loss, who presented to [facility] from the ED from SNF due to visual changes.  History is mainly obtained from EDP as the patient is a poor historian.  The patient has been having floaters in her vision for about weeks. In the ED, workup revealed UA positive for pyuria with concern for sepsis.  She was started on empiric IV antibiotics and TRH, hospitalist service, was asked to admit. CXR showing multifocal pneumonia.   Assessment & Plan    Assessment and Plan:  SIRS: Hypothermia improving.  From multifocal pneumonia and UTI:  Blood cultures growing staph epidermidis in aerobic bottles, possibly contaminant.  Urine cultures showing e coli.  Started her on broad spectrum IV antibiotics.  Get sputum cultures if able.  She is currently on RA.  SLP eval for possible aspiration and dysphagia.  Lactic acid wnl. RSV , covid19 PCR and influenza PCR is negative.    Paroxysmal SVT:  She is bradycardic. Was on lopressor. Will put her on PRN metoprolol.    Iron deficiency anemia/ anemia of chronic disease: Normocytic. Hemoglobin around 10.     Type 2 DM with  hyperglycemia:  CBG (last 3)  Recent Labs    03/19/22 2019 03/20/22 0740 03/20/22 1206  GLUCAP 122* 74 137*    Resume SSI.  Hemoglobin A1c is pending.     Chronic anxiety and depression:  Resume zoloft.    Visual changes : MRI brain without contrast is negative for acute stroke.   Loose bowel movements -  Since one year, . Intermittent , not associated with nausea, vomiting or abdominal  pain.       Estimated body mass index is 29.85 kg/m as calculated from the following:   Height as of this encounter: '5\' 11"'$  (1.803 m).   Weight as of this encounter: 97.1 kg.  Code Status: full code.  DVT Prophylaxis:  apixaban (ELIQUIS) tablet 2.5 mg Start: 03/19/22 1500 apixaban (ELIQUIS) tablet 2.5 mg   Level of Care: Level of care: Progressive Family Communication: none at bedside.   Disposition Plan:     Remains inpatient appropriate:  IV antibiotics.   Procedures:  None.   Consultants:   None.   Antimicrobials:   Anti-infectives (From admission, onward)    Start     Dose/Rate Route Frequency Ordered Stop   03/19/22 2359  cefTRIAXone (ROCEPHIN) 2 g in sodium chloride 0.9 % 100 mL IVPB        2 g 200 mL/hr over 30 Minutes Intravenous Every 24 hours 03/19/22 0545     03/19/22 1000  doxycycline (VIBRA-TABS) tablet 100 mg        100 mg Oral  Every 12 hours 03/19/22 0545     03/18/22 2300  cefTRIAXone (ROCEPHIN) 1 g in sodium chloride 0.9 % 100 mL IVPB        1 g 200 mL/hr over 30 Minutes Intravenous  Once 03/18/22 2259 03/19/22 0401   03/18/22 2300  doxycycline (VIBRA-TABS) tablet 100 mg        100 mg Oral  Once 03/18/22 2259 03/18/22 2358        Medications  Scheduled Meds:  apixaban  2.5 mg Oral BID   doxycycline  100 mg Oral Q12H   ferrous sulfate  325 mg Oral Q breakfast   folic acid  1 mg Oral Daily   insulin aspart  0-5 Units Subcutaneous QHS   insulin aspart  0-9 Units Subcutaneous TID WC   montelukast  10 mg Oral QHS   multivitamin with minerals  1 tablet Oral Daily   pantoprazole  40 mg Oral Daily   sertraline  25 mg Oral Daily   thiamine  100 mg Oral Daily   Continuous Infusions:  cefTRIAXone (ROCEPHIN)  IV 2 g (03/19/22 2344)   PRN Meds:.    Subjective:   [name] [name] was seen and examined today. Loose bowel movements last night.  No nausea, vomiting or abdominal pain.   Objective:   Vitals:   03/20/22 0906 03/20/22 1127  03/20/22 1128 03/20/22 1300  BP: 126/76 137/65    Pulse: 62 (!) 55    Resp:      Temp:  (!) 96.6 F (35.9 C) (!) 96.4 F (35.8 C) (!) 97 F (36.1 C)  TempSrc:  Rectal Axillary Axillary  SpO2: 98% 98%    Weight:      Height:        Intake/Output Summary (Last 24 hours) at 03/20/2022 1310 Last data filed at 03/20/2022 1208 Gross per 24 hour  Intake 0.35 ml  Output 250 ml  Net -249.65 ml   Filed Weights   03/19/22 1347  Weight: 97.1 kg     Exam General exam: Appears calm and comfortable  Respiratory system: Clear to auscultation. Respiratory effort normal. Cardiovascular system: S1 & S2 heard, RRR. No JVD,  No pedal edema. Gastrointestinal system: Abdomen is nondistended, soft and nontender.  Normal bowel sounds heard. Central nervous system: Alert and oriented. No focal neurological deficits. Extremities: Symmetric 5 x 5 power. Skin: No rashes, Psychiatry:  Mood & affect appropriate.    Data Reviewed:  I have personally reviewed following labs and imaging studies   CBC Lab Results  Component Value Date   WBC 3.7 (L) 03/20/2022   RBC 4.07 03/20/2022   HGB 10.5 (L) 03/20/2022   HCT 34.8 (L) 03/20/2022   MCV 85.5 03/20/2022   MCH 25.8 (L) 03/20/2022   PLT 214 03/20/2022   MCHC 30.2 03/20/2022   RDW 17.0 (H) 03/20/2022   LYMPHSABS 1.5 03/20/2022   MONOABS 0.3 03/20/2022   EOSABS 0.1 03/20/2022   BASOSABS 0.0 23/76/2831     Last metabolic panel Lab Results  Component Value Date   NA 143 03/20/2022   K 4.9 03/20/2022   CL 110 03/20/2022   CO2 24 03/20/2022   BUN 30 (H) 03/20/2022   CREATININE 0.93 03/20/2022   GLUCOSE 91 03/20/2022   GFRNONAA >60 03/20/2022   GFRAA 41 (L) 01/09/2019   CALCIUM 9.3 03/20/2022   PHOS 3.7 03/19/2022   PROT 6.4 (L) 02/16/2021   ALBUMIN 2.2 (L) 02/16/2021   BILITOT 0.6 02/16/2021   ALKPHOS 82 02/16/2021  AST 16 02/16/2021   ALT 17 02/16/2021   ANIONGAP 9 03/20/2022    CBG (last 3)  Recent Labs    03/19/22 2019  03/20/22 0740 03/20/22 1206  GLUCAP 122* 74 137*       Coagulation Profile: No results for input(s): "INR", "PROTIME" in the last 168 hours.   Radiology Studies: DG Chest Portable 1 View  Result Date: 03/18/2022 CLINICAL DATA:  Infection EXAM: PORTABLE CHEST 1 VIEW COMPARISON:  02/15/2017 FINDINGS: 2 frontal views of the chest demonstrate an unremarkable cardiac silhouette. There is patchy bibasilar airspace disease. No effusion or pneumothorax. No acute bony abnormalities. IMPRESSION: 1. Patchy bibasilar airspace disease consistent with multifocal bronchopneumonia. Aspiration could be considered in the appropriate setting. Electronically Signed   By: Randa Ngo M.D.   On: 03/18/2022 22:47   MR BRAIN WO CONTRAST  Result Date: 03/18/2022 CLINICAL DATA:  TIA, history of Wernicke's encephalopathy. Reported visual disturbances and vertigo. EXAM: MRI HEAD WITHOUT CONTRAST TECHNIQUE: Multiplanar, multiecho pulse sequences of the brain and surrounding structures were obtained without intravenous contrast. COMPARISON:  Brain MRI 03/18/2020 FINDINGS: Brain: There is no acute intracranial hemorrhage, extra-axial fluid collection, or acute infarct. Parenchymal volume is normal. The ventricles are normal in size. Gray-white differentiation is preserved. There is a minimal for age burden of small-vessel ischemic change in the supratentorial white matter. There is no evidence of Wernicke's encephalopathy or other acute parenchymal signal abnormality. There is no mass lesion.  There is no mass effect or midline shift. Vascular: Normal flow voids. Skull and upper cervical spine: There is no suspicious marrow signal abnormality. There is degenerative change at C3-C4 resulting in moderate to severe spinal canal stenosis with possible cord compression, similar to 2021. Sinuses/Orbits: There is mild mucosal thickening in the paranasal sinuses. The globes and orbits are unremarkable. Other: None. IMPRESSION: 1.  Unremarkable for age appearance of the brain with no acute intracranial pathology. 2. Degenerative change at C3-C4 resulting in moderate to severe spinal canal stenosis with cord compression, similar to 2021. Electronically Signed   By: Valetta Mole M.D.   On: 03/18/2022 21:28       Hosie Poisson M.D. [facility] 03/20/2022, 1:10 PM  Available via Epic secure chat 7am-7pm After 7 pm, please refer to night coverage provider listed on amion.

## 2022-03-20 NOTE — Evaluation (Signed)
Physical Therapy Evaluation Patient Details Name: [name] [name] MRN: [id number] DOB: 01-Jun-1945 Today's Date: 03/20/2022  History of Present Illness  Pt is a 76 year old female admitted with SIRS from multifocal pneumonia and UTI; PMH significant for paroxysmal SVT, hypertension, type 2 diabetes, hearing loss   Clinical Impression  Pt agreeable to be seen and evaluated by PT & OT in co-eval session. Pt admitted with above diagnosis, lives at [facility] as LTC per pt report has been non-ambulatory since 2021 and has been using sliding board to transfer to/from bed to WC/BSC; requires hoyer lift at times. Pt currently with functional limitations due to the deficits listed below (see PT Problem List). Pt required min assist for bed mobility and up to mod assist +2 for lateral scoot transfer to recliner. Pt completed several BLE exercises with good form. Per pt report she is close to her baseline Pt will benefit from skilled PT to increase their independence and safety with mobility to allow discharge back to [facility] and resume PT services there.         Recommendations for follow up therapy are one component of a multi-disciplinary discharge planning process, led by the attending physician.  Recommendations may be updated based on patient status, additional functional criteria and insurance authorization.  Follow Up Recommendations Skilled nursing-short term rehab (<3 hours/day) (Return to [facility] and resume PT there) Can patient physically be transported by private vehicle: No    Assistance Recommended at Discharge Intermittent Supervision/Assistance  Patient can return home with the following  A little help with walking and/or transfers;A little help with bathing/dressing/bathroom;Assistance with cooking/housework;Assist for transportation;Help with stairs or ramp for entrance    Equipment Recommendations None recommended by PT  Recommendations for Other Services        Functional Status Assessment Patient has had a recent decline in their functional status and demonstrates the ability to make significant improvements in function in a reasonable and predictable amount of time.     Precautions / Restrictions Precautions Precautions: Fall Restrictions Weight Bearing Restrictions: No      Mobility  Bed Mobility Overal bed mobility: Needs Assistance Bed Mobility: Supine to Sit     Supine to sit: Min assist, HOB elevated     General bed mobility comments: Min assist to bring BLE off bed    Transfers Overall transfer level: Needs assistance Equipment used: None Transfers: Bed to chair/wheelchair/BSC            Lateral/Scoot Transfers: +2 physical assistance, +2 safety/equipment, Min assist, Mod assist General transfer comment: Pt completed lateral scooting transfer with min assist +2 while on the bed with mod assist +2 using chuck pad to "get over the hump" onto the recliner surface. Pt able to provide the majority of the movement but expressed some anxiety about movement, with encouragement completed transfer. Further mobility deferred    Ambulation/Gait               General Gait Details: pt non-ambulatory  Stairs            Wheelchair Mobility    Modified Rankin (Stroke Patients Only)       Balance Overall balance assessment: Needs assistance Sitting-balance support: Feet supported Sitting balance-Leahy Scale: Good         Standing balance comment: Pt non-ambulatory                             Pertinent Vitals/Pain  Pain Assessment Pain Assessment: No/denies pain    Home Living Family/patient expects to be discharged to:: Skilled nursing facility                   Additional Comments: Pt reports she lives at [facility] and transfers to [facility] and [facility] via sliding board.    Prior Function Prior Level of Function : Needs assist             Mobility Comments: report slide board  transfer to mwc or use of hoyer lift, does not amb since 2021 per pt report ADLs Comments: pt reports SET UP for bathing bed level, UB dressing, grooming; assist for LB dressing     Hand Dominance   Dominant Hand: Right    Extremity/Trunk Assessment   Upper Extremity Assessment Upper Extremity Assessment: Defer to OT evaluation    Lower Extremity Assessment Lower Extremity Assessment: Generalized weakness;RLE deficits/detail;LLE deficits/detail RLE Deficits / Details: Pt with lymphedema wraps on distal LE, functional ROM, MMT not tested. RLE Sensation: history of peripheral neuropathy LLE Deficits / Details: Pt with lymphedema wraps on distal LE, functional ROM, MMT not tested. LLE Sensation: history of peripheral neuropathy    Cervical / Trunk Assessment Cervical / Trunk Assessment: Kyphotic  Communication   Communication: No difficulties  Cognition Arousal/Alertness: Awake/alert Behavior During Therapy: WFL for tasks assessed/performed Overall Cognitive Status: No family/caregiver present to determine baseline cognitive functioning Area of Impairment: Safety/judgement, Awareness, Problem solving                         Safety/Judgement: Decreased awareness of deficits Awareness: Emergent Problem Solving: Slow processing, Requires verbal cues General Comments: pt tangental at times.        General Comments      Exercises General Exercises - Lower Extremity Ankle Circles/Pumps: AROM, Both, 10 reps, Seated Long Arc Quad: AROM, Both, 10 reps, Seated Hip Flexion/Marching: AROM, Both, 10 reps, Seated   Assessment/Plan    PT Assessment Patient needs continued PT services  PT Problem List Decreased strength;Decreased range of motion;Decreased activity tolerance;Decreased balance;Decreased mobility;Decreased coordination;Pain       PT Treatment Interventions DME instruction;Gait training;Stair training;Functional mobility training;Therapeutic  activities;Therapeutic exercise;Balance training;Neuromuscular re-education;Patient/family education    PT Goals (Current goals can be found in the Care Plan section)  Acute Rehab PT Goals Patient Stated Goal: Would love to walk again PT Goal Formulation: With patient Time For Goal Achievement: 04/03/22 Potential to Achieve Goals: Good    Frequency Min 2X/week     Co-evaluation PT/OT/SLP Co-Evaluation/Treatment: Yes Reason for Co-Treatment: For patient/therapist safety;To address functional/ADL transfers PT goals addressed during session: Mobility/safety with mobility OT goals addressed during session: ADL's and self-care       AM-PAC PT "6 Clicks" Mobility  Outcome Measure Help needed turning from your back to your side while in a flat bed without using bedrails?: A Little Help needed moving from lying on your back to sitting on the side of a flat bed without using bedrails?: A Little Help needed moving to and from a bed to a chair (including a wheelchair)?: A Lot Help needed standing up from a chair using your arms (e.g., wheelchair or bedside chair)?: Total Help needed to walk in hospital room?: Total Help needed climbing 3-5 steps with a railing? : Total 6 Click Score: 11    End of Session Equipment Utilized During Treatment: Gait belt Activity Tolerance: Patient tolerated treatment well;No increased pain Patient left: in  chair;with call bell/phone within reach Nurse Communication: Mobility status PT Visit Diagnosis: Muscle weakness (generalized) (M62.81);Other abnormalities of gait and mobility (R26.89)    Time: 6720-9198 PT Time Calculation (min) (ACUTE ONLY): 22 min   Charges:   PT Evaluation $PT Eval Low Complexity: 1 Low          Coolidge Breeze, PT, DPT [facility] Rehabilitation Department Office: [phone] Weekend pager: [phone]  Coolidge Breeze 03/20/2022, 3:32 PM

## 2022-03-20 NOTE — TOC Initial Note (Signed)
Transition of Care [facility]) - Initial/Assessment Note    Patient Details  Name: [name] [name] MRN: [id number] Date of Birth: 08/16/1945  Transition of Care [facility]) CM/SW Contact:    Illene Regulus, LCSW Phone Number: 03/20/2022, 9:24 AM  Clinical Narrative:                 TOC CSW spoke with [place] facility to verify pt is a LTC resident and can return when ready.    Expected Discharge Plan: Skilled Nursing Facility Barriers to Discharge: Continued Medical Work up   Patient Goals and CMS Choice Patient states their goals for this hospitalization and ongoing recovery are:: return to nursing facility      Expected Discharge Plan and Services Expected Discharge Plan: [place]                                              Prior Living Arrangements/Services   Lives with:: Self Patient language and need for interpreter reviewed:: Yes Do you feel safe going back to the place where you live?: Yes      Need for Family Participation in Patient Care: No (Comment) Care giver support system in place?: Yes (comment)   Criminal Activity/Legal Involvement Pertinent to Current Situation/Hospitalization: No - Comment as needed  Activities of Daily Living Home Assistive Devices/Equipment: Shower chair without back, Wheelchair, Grab bars around toilet, Grab bars in shower ADL Screening (condition at time of admission) Patient's cognitive ability adequate to safely complete daily activities?: Yes Is the patient deaf or have difficulty hearing?: No Does the patient have difficulty seeing, even when wearing glasses/contacts?: No Does the patient have difficulty concentrating, remembering, or making decisions?: No Patient able to express need for assistance with ADLs?: Yes Does the patient have difficulty dressing or bathing?: No Independently performs ADLs?: Yes (appropriate for developmental age) Does the patient have difficulty walking or  climbing stairs?: Yes Weakness of Legs: Both Weakness of Arms/Hands: None  Permission Sought/Granted                  Emotional Assessment Appearance:: Appears stated age Attitude/Demeanor/Rapport: Gracious Affect (typically observed): Accepting Orientation: : Oriented to Self, Oriented to Place, Oriented to  Time, Oriented to Situation   Psych Involvement: No (comment)  Admission diagnosis:  Bradycardia [R00.1] Hypothermia, initial encounter [T68.XXXA] Sepsis ([place]) [A41.9] Severe sepsis ([place]) [A41.9, R65.20] Sepsis, due to unspecified organism, unspecified whether acute organ dysfunction present [facility]) [A41.9] Patient Active Problem List   Diagnosis Date Noted   Severe sepsis ([place]) 03/18/2022   Protein-calorie malnutrition, severe 02/18/2021   Sepsis ([place]) 02/15/2021   Polyarthralgia    Wernicke's aphasia    Bilateral leg ulcer, limited to breakdown of skin ([place]) 01/16/2020   Allergic rhinitis 07/28/2019   Postnasal drip 11/16/2017   Sensorineural hearing loss (SNHL), bilateral 11/16/2017   Tinnitus of both ears 11/16/2017   Obesity, Class II, BMI 35-39.9, isolated (see actual BMI) 11/08/2017   CKD (chronic kidney disease), stage III ([place]) 11/08/2017   Anemia 11/07/2017   Thrombocytosis 11/07/2017   Lymphedema 11/07/2017   AKI (acute kidney injury) ([place]) 10/16/2017   Routine general medical examination at a health care facility 05/10/2014   Paroxysmal SVT (supraventricular tachycardia) ([place]) 11/28/2013   Physical deconditioning 11/23/2013   Generalized weakness 11/17/2013   Multilevel spine pain 11/17/2013   Aortic stenosis, mild 11/17/2013  DM type 2 (diabetes mellitus, type 2) ([place]) 02/02/2013   Arthritis 10/19/2012   Mixed incontinence 10/19/2012   GERD (gastroesophageal reflux disease) 04/20/2011   HTN (hypertension) 01/20/2011   Morbid obesity ([place])    PCP:  Hoyt Koch, MD Pharmacy:   [id number] [place], [id number]: [phone]: [phone] [place] [address] [id number]: [phone]: [phone]    Social Determinants of Health (SDOH) Interventions Housing Interventions: Intervention Not Indicated  Readmission Risk Interventions     No data to display

## 2022-03-20 NOTE — Progress Notes (Signed)
Patient has bilateral lower legs wrapped in kerlix/Ace bandages from feet to knees. Bandages removed and legs assessed- no open areas seen. Pt does have a dry, flaky, potentially scabbed area to lateral right shin. Skin is very thin and pink under bandages. Per patient, dressings are changed 3x/week by a wound nurse at her facility. MD Karleen Hampshire made aware. Bandages replaced and WOC Consult placed.

## 2022-03-20 NOTE — Evaluation (Signed)
Clinical/Bedside Swallow Evaluation Patient Details  Name: Graycen Degan MRN: [id number] Date of Birth: 03/07/1946  Today's Date: 03/20/2022 Time: SLP Start Time (ACUTE ONLY): 1228 SLP Stop Time (ACUTE ONLY): 7902 SLP Time Calculation (min) (ACUTE ONLY): 17 min  Past Medical History:  Past Medical History:  Diagnosis Date   Acute kidney injury ([place])    Aortic stenosis, mild 11/17/2013   Arthritis    "both knees, spine, back, fingers" (11/29/2013)   CHF (congestive heart failure) (HCC)    Edema    GERD (gastroesophageal reflux disease)    Heart murmur    History of diastolic dysfunction    Echo 07/971 with diastolic dysfunction   HTN (hypertension)    Morbid obesity (HCC)    OA (osteoarthritis)    PAC (premature atrial contraction)    per prior Holter   Palpitations    Pneumonia    "as a child"   PVC's (premature ventricular contractions)    per prior Holter   SVT (supraventricular tachycardia)    Tachycardia    noted at 07/28/11 visit. Started on beta blocker. Possible atrial flutter vs long PR tachycardia/AVNRT   Type II diabetes mellitus ([place])    Past Surgical History:  Past Surgical History:  Procedure Laterality Date   BIOPSY  03/23/2020   Procedure: BIOPSY;  Surgeon: Juanita Craver, MD;  Location: [facility];  Service: Endoscopy;;   [place]   ESOPHAGOGASTRODUODENOSCOPY (EGD) WITH PROPOFOL N/A 03/23/2020   Procedure: ESOPHAGOGASTRODUODENOSCOPY (EGD) WITH PROPOFOL;  Surgeon: Juanita Craver, MD;  Location: [facility] ENDOSCOPY;  Service: Endoscopy;  Laterality: N/A;   SUPRAVENTRICULAR TACHYCARDIA ABLATION  11/29/2013   SUPRAVENTRICULAR TACHYCARDIA ABLATION N/A 11/29/2013   Procedure: SUPRAVENTRICULAR TACHYCARDIA ABLATION;  Surgeon: Evans Lance, MD;  Location: [facility] CATH LAB;  Service: Cardiovascular;  Laterality: N/A;   US ECHOCARDIOGRAPHY  07/25/2008   EF 55-60%   VAGINAL DELIVERY     HPI:  76 yo female adm to [facility] with AMS from SNF.   Pt with PMH + for GERD,, esophageal dysmotility, r paroxysmal SVT, hypertension, type 2 diabetes, hearing loss, weakness. She is at [facility] over the last few years.   UA positive for pyuria with concern for sepsis.   Chest imaging showed multifocal pna, ? concerning for aspiration.  Pt denies significant issues with swallowing, admits to occasional issues with coughing with liquids *first swallow. Med list includes Carafate 1 g QID since she underwent endoscopy and was diagnosed with esophagitis - prior h/o Schatki's ring.  Pt reports recent loss of few upper teeth that has been present.  She eats regular/thin diet at facility per pt- takes her medications with yogurt, or water crushed.  Reports recent issues with "stuttering" speech    Assessment / Plan / Recommendation  Clinical Impression  Patient greeted sitting upright in bed.  She was pleasant, followed directions - did not demonstrates any focal CN deficits.  No indication of aspiration with intake observed -  Chili beans, gingerale and coffee.  Oral clearance adequate and swallow/respiratory pattern adequate.   Pt's primary aspiration risk in this SLPs opinion is her known GERD, esophageal dysmotlity issues.   Pt does take her medications with yogurt or liquids - crushed - has avoided applesauce as she is worried it has contributed to her bowel issues.  Recommend continue diet as tolerated.  Reviewed esophageal and GERD precautions with pt and will send handout with dysmotlity compensations.  No SlP follow up indicated. Thanks for this  consult. SLP Visit Diagnosis: Dysphagia, unspecified (R13.10)    Aspiration Risk  Mild aspiration risk    Diet Recommendation Regular;Thin liquid   Liquid Administration via: Cup;Straw Medication Administration: Other (Comment) (defer to pt) Supervision: Patient able to self feed Compensations: Slow rate;Small sips/bites Postural Changes: Remain upright for at least 30 minutes after po intake;Seated upright at  90 degrees    Other  Recommendations Oral Care Recommendations: Oral care BID    Recommendations for follow up therapy are one component of a multi-disciplinary discharge planning process, led by the attending physician.  Recommendations may be updated based on patient status, additional functional criteria and insurance authorization.  Follow up Recommendations No SLP follow up      Assistance Recommended at Discharge  N/a  Functional Status Assessment  N/a  Frequency and Duration   N/a         Prognosis   N/a     Swallow Study   General HPI: 76 yo female adm to [facility] with AMS from SNF.  Pt with PMH + for GERD,, esophageal dysmotility, r paroxysmal SVT, hypertension, type 2 diabetes, hearing loss, weakness. She is at [facility] over the last few years.   UA positive for pyuria with concern for sepsis.   Chest imaging showed multifocal pna, ? concerning for aspiration.  Pt denies significant issues with swallowing, admits to occasional issues with coughing with liquids *first swallow. Med list includes Carafate 1 g QID since she underwent endoscopy and was diagnosed with esophagitis - prior h/o Schatki's ring.  Pt reports recent loss of few upper teeth that has been present.  She eats regular/thin diet at facility per pt- takes her medications with yogurt, or water crushed.  Reports recent issues with "stuttering" speech Type of Study: Bedside Swallow Evaluation Diet Prior to this Study: Dysphagia 3 (soft);Thin liquids Temperature Spikes Noted: No Respiratory Status: Room air History of Recent Intubation: No Behavior/Cognition: Alert;Cooperative Oral Cavity Assessment: Within Functional Limits Oral Care Completed by SLP: No Oral Cavity - Dentition: Poor condition;Missing dentition Vision: Functional for self-feeding Self-Feeding Abilities: Able to feed self Patient Positioning: Upright in bed Baseline Vocal Quality: Normal Volitional Cough: Strong Volitional Swallow: Able to elicit     Oral/Motor/Sensory Function Overall Oral Motor/Sensory Function: Within functional limits   Ice Chips Ice chips: Not tested   Thin Liquid Thin Liquid: Within functional limits Presentation: Cup;Straw;Self Fed    Nectar Thick Nectar Thick Liquid: Not tested   Honey Thick Honey Thick Liquid: Not tested   Puree Puree: Not tested   Solid     Solid: Within functional limits Presentation: Self Fed      [name] 03/20/2022,1:33 PM  Kathleen Lime, MS [facility] SLP Acute Rehab Services Office [phone] Pager [phone]

## 2022-03-20 NOTE — Progress Notes (Signed)
OT Cancellation Note  Patient Details Name: Labrittany Wechter MRN: [id number] DOB: 12-26-1945   Cancelled Treatment:    Reason Eval/Treat Not Completed: Other (comment) (per RN, hold at this time, due to low temp. OT will reattempt as able.Shanon Payor, OTD OTR/L  03/20/22, 1:23 PM

## 2022-03-20 NOTE — Progress Notes (Signed)
Initial Nutrition Assessment  DOCUMENTATION CODES:   Non-severe (moderate) malnutrition in context of chronic illness  INTERVENTION:  - Liberalize to Regular diet to avoid restricting intake. - Boost Plus po BID, each supplement provides 260 calories and 14g protein.  - Continue multivitamin, folic acid, and thiamine as medically appropriate.  - Continue iron supplementation as indicated. - Monitor weight trends.   NUTRITION DIAGNOSIS:   Moderate Malnutrition related to chronic illness as evidenced by moderate muscle depletion, moderate fat depletion, percent weight loss (8% in 5 weeks).  GOAL:   Patient will meet greater than or equal to 90% of their needs  MONITOR:   PO intake, Weight trends, Supplement acceptance, Labs  REASON FOR ASSESSMENT:   Malnutrition Screening Tool    ASSESSMENT:   76 y.o. female with medical history significant for paroxysmal SVT, hypertension, type 2 diabetes, hearing loss, who presented from SNF due to visual changes.  Patient reports she used to weigh as much as 300# when she initially went to SNF back in 2021. Endorses steady weight loss since that time but is not upset about the loss. Per EMR, patient weighed at 233# in November and now weighed at 214# - a 19# or 8% weight loss in 5 weeks, significant.   Patient attributes weight loss to not enjoying the food at her SNF. Notes she usually has a breakfast but does not always have a lunch or dinner, it just depends on what she is served. Notes she sometimes gets a meat she doesn't know what it is so she doesn't eat it. Shares she will usually eat spaghetti, mac and cheese, fried chicken, fish, and salad. Also drinks an Ensure supplied by the facility or Boost brought in by family each day. Likes Boost more than Ensure but will drink Ensure if it is all she has. Appetite is usually poor at SNF.  Thankfully, she reports she enjoys the food much better here and her appetite is better. Patient notes  she doesn't have a lot of her teeth but this doesn't usually affect her. SLP evaluated patient today and recommend regular diet with thin liquids. Patient would like to receive Boost during admission.  Medications reviewed and include: Iron, Folic acid, Insulin, MVI, Thiamine  Labs reviewed:  HA1C 6.0 (02/15/21)  - new HA1C pending   NUTRITION - FOCUSED PHYSICAL EXAM:  Flowsheet Row Most Recent Value  Orbital Region Moderate depletion  Upper Arm Region Moderate depletion  Thoracic and Lumbar Region Mild depletion  Buccal Region Mild depletion  Temple Region  Moderate depletion  Clavicle Bone Region Moderate depletion  Clavicle and Acromion Bone Region Moderate depletion  Scapular Bone Region Unable to assess  Dorsal Hand Mild depletion  Patellar Region Mild depletion  Anterior Thigh Region Mild depletion  Posterior Calf Region Mild depletion  Edema (RD Assessment) Mild  Hair Reviewed  Eyes Reviewed  Mouth Reviewed  Skin Reviewed  Nails Reviewed       Diet Order:   Diet Order             Diet heart healthy/carb modified Room service appropriate? Yes; Fluid consistency: Thin  Diet effective now                   EDUCATION NEEDS:  Education needs have been addressed  Skin:  Skin Assessment: Skin Integrity Issues: Skin Integrity Issues:: Stage I Stage I: Coccyx  Last BM:  12/14  Height: Ht Readings from Last 1 Encounters:  03/19/22 _0  (1.803 m)  Weight:  Wt Readings from Last 1 Encounters:  03/19/22 97.1 kg   Ideal Body Weight:  70.45 kg  BMI:  Body mass index is 29.85 kg/m.  Estimated Nutritional Needs:  Kcal:  3149-7026 kcals Protein:  105-125 grams Fluid:  >/= 1.8L    Samson Frederic RD, LDN For contact information, refer to [facility].

## 2022-03-20 NOTE — Progress Notes (Signed)
Attempt to get temperature orally and axillary was unsuccessful so it was done rectally.

## 2022-03-20 NOTE — Progress Notes (Signed)
   03/20/22 1300  Clinical Encounter Type  Visited With Patient  Visit Type Initial  Referral From Physician  Consult/Referral To Chaplain  Spiritual Encounters  Spiritual Needs Sacred text;Prayer;Ritual;Emotional  Stress Factors  Patient Stress Factors Financial concerns;Health changes;Lack of caregivers;Lack of knowledge;Loss;Major life changes  Family Stress Factors Exhausted;Lack of caregivers   Met with Patient at bedside this afternoon.  She expressed her emotions and frustrations regarding her care and her progress and worry for her future.  She spoke of her strength of her parents growing  up and her love for her children and family.   Chaplain provided comforting words of counsel and support for her faith and medical journey.  She requested prayer and we did so together.  She requested follow upvisits from Chaplain for continued prayer./

## 2022-03-20 NOTE — Evaluation (Signed)
Occupational Therapy Evaluation Patient Details Name: [name] [name] MRN: [id number] DOB: 1945-06-16 Today's Date: 03/20/2022   History of Present Illness Pt is a 76 year old female admitted with SIRS from multifocal pneumonia and UTI; PMH significant for paroxysmal SVT, hypertension, type 2 diabetes, hearing loss   Clinical Impression   Chart reviewed, pt greeted in bed agreeable to OT evaluation. Co tx completed with PT on this date. PTA pt lives at [facility] for LTC per [facility] note, pt endorses this. Pt reports currently receiving therapy, transferring to mwc with slide board or hoyer lift as needed. Pt reports SET UP for bathing, UB dressing, feeding, grooming tasks. Assist for IADLs. Pt presents with deficits is strength, endurance, balance, activity tolerance affecting safe and optimal DL completion. Pt appears to be close to baseline, reports feeling slightly weaker than her typical baseline. Recommend discharge back to SNF with OT to address functional deficits. OT will continue to follow acutely.      Recommendations for follow up therapy are one component of a multi-disciplinary discharge planning process, led by the attending physician.  Recommendations may be updated based on patient status, additional functional criteria and insurance authorization.   Follow Up Recommendations  Skilled nursing-short term rehab (<3 hours/day)     Assistance Recommended at Discharge Frequent or constant Supervision/Assistance  Patient can return home with the following A lot of help with walking and/or transfers;A lot of help with bathing/dressing/bathroom    Functional Status Assessment  Patient has had a recent decline in their functional status and demonstrates the ability to make significant improvements in function in a reasonable and predictable amount of time.  Equipment Recommendations  Other (comment) (defer)    Recommendations for Other Services       Precautions / Restrictions  Precautions Precautions: Fall Restrictions Weight Bearing Restrictions: No      Mobility Bed Mobility Overal bed mobility: Needs Assistance Bed Mobility: Supine to Sit     Supine to sit: Min assist, HOB elevated          Transfers Overall transfer level: Needs assistance   Transfers: Bed to chair/wheelchair/BSC            Lateral/Scoot Transfers: +2 physical assistance, +2 safety/equipment, Min assist, Mod assist        Balance Overall balance assessment: Needs assistance Sitting-balance support: Feet supported Sitting balance-Leahy Scale: Good         Standing balance comment: not attempted on this date                           ADL either performed or assessed with clinical judgement   ADL Overall ADL's : Needs assistance/impaired Eating/Feeding: Set up;Sitting   Grooming: Wash/dry hands;Sitting;Set up;Wash/dry face       Lower Body Bathing: Maximal assistance   Upper Body Dressing : Minimal assistance   Lower Body Dressing: Maximal assistance   Toilet Transfer: Minimal assistance;Moderate assistance Toilet Transfer Details (indicate cue type and reason): lateral scoot tranfer +2 to bedside chair, verbal cues for sequencing and encouragement Toileting- Clothing Manipulation and Hygiene: Maximal assistance               Vision Patient Visual Report: No change from baseline       Perception     Praxis      Pertinent Vitals/Pain Pain Assessment Pain Assessment: No/denies pain     Hand Dominance     Extremity/Trunk Assessment Upper Extremity Assessment Upper Extremity Assessment:  Generalized weakness   Lower Extremity Assessment Lower Extremity Assessment: Generalized weakness       Communication Communication Communication: No difficulties   Cognition Arousal/Alertness: Awake/alert Behavior During Therapy: WFL for tasks assessed/performed Overall Cognitive Status: No family/caregiver present to determine  baseline cognitive functioning Area of Impairment: Safety/judgement, Awareness, Problem solving                         Safety/Judgement: Decreased awareness of deficits Awareness: Emergent Problem Solving: Slow processing, Requires verbal cues General Comments: pt tangental at times, ?historian     General Comments       Exercises Other Exercises Other Exercises: edu re: role of OT, role of rehab, discharge recommendations, home safety   Shoulder Instructions      Home Living Family/patient expects to be discharged to:: Skilled nursing facility                                 Additional Comments: pt reports she lives at [facility]      Prior Functioning/Environment Prior Level of Function : Needs assist             Mobility Comments: report slide board transfer to mwc or use of hoyer lift, does not amb since 2021 per pt report ADLs Comments: pt reports SET UP for bathing bed level, UB dressing, grooming; assist for LB dressing        OT Problem List: Decreased strength;Decreased activity tolerance;Impaired balance (sitting and/or standing)      OT Treatment/Interventions: Self-care/ADL training;Patient/family education;Therapeutic exercise;Balance training;Energy conservation;Therapeutic activities;DME and/or AE instruction    OT Goals(Current goals can be found in the care plan section) Acute Rehab OT Goals Patient Stated Goal: walk OT Goal Formulation: With patient Time For Goal Achievement: 04/03/22 Potential to Achieve Goals: Fair ADL Goals Pt Will Perform Grooming: with set-up;sitting Pt Will Transfer to Toilet: with min assist;with +2 assist Pt Will Perform Toileting - Clothing Manipulation and hygiene: with min assist  OT Frequency: Min 1X/week    Co-evaluation PT/OT/SLP Co-Evaluation/Treatment: Yes Reason for Co-Treatment: For patient/therapist safety;To address functional/ADL transfers   OT goals addressed during session:  ADL's and self-care      AM-PAC OT "6 Clicks" Daily Activity     Outcome Measure Help from another person eating meals?: None Help from another person taking care of personal grooming?: None Help from another person toileting, which includes using toliet, bedpan, or urinal?: A Lot Help from another person bathing (including washing, rinsing, drying)?: A Lot Help from another person to put on and taking off regular upper body clothing?: A Little Help from another person to put on and taking off regular lower body clothing?: A Lot 6 Click Score: 17   End of Session Nurse Communication: Mobility status  Activity Tolerance: Patient tolerated treatment well Patient left: in chair;with call bell/phone within reach  OT Visit Diagnosis: Muscle weakness (generalized) (M62.81)                Time: 6644-0347 OT Time Calculation (min): 22 min Charges:  OT General Charges $OT Visit: 1 Visit OT Evaluation $OT Eval Low Complexity: 1 Low Shanon Payor, OTD OTR/L  03/20/22, 3:24 PM

## 2022-03-21 DIAGNOSIS — A419 Sepsis, unspecified organism: Secondary | ICD-10-CM | POA: Diagnosis not present

## 2022-03-21 DIAGNOSIS — T68XXXA Hypothermia, initial encounter: Secondary | ICD-10-CM | POA: Diagnosis not present

## 2022-03-21 DIAGNOSIS — J189 Pneumonia, unspecified organism: Secondary | ICD-10-CM | POA: Diagnosis not present

## 2022-03-21 DIAGNOSIS — R6520 Severe sepsis without septic shock: Secondary | ICD-10-CM | POA: Diagnosis not present

## 2022-03-21 DIAGNOSIS — E440 Moderate protein-calorie malnutrition: Secondary | ICD-10-CM | POA: Insufficient documentation

## 2022-03-21 LAB — BASIC METABOLIC PANEL
Anion gap: 8 (ref 5–15)
BUN: 34 mg/dL — ABNORMAL HIGH (ref 8–23)
CO2: 25 mmol/L (ref 22–32)
Calcium: 9.5 mg/dL (ref 8.9–10.3)
Chloride: 107 mmol/L (ref 98–111)
Creatinine, Ser: 0.95 mg/dL (ref 0.44–1.00)
GFR, Estimated: >60 mL/min (ref >60)
Glucose, Bld: 74 mg/dL (ref 70–99)
Potassium: 4.8 mmol/L (ref 3.5–5.1)
Sodium: 140 mmol/L (ref 135–145)

## 2022-03-21 LAB — CBC WITH DIFFERENTIAL/PLATELET
Abs Immature Granulocytes: 0 10*3/uL (ref 0.00–0.07)
Basophils Absolute: 0 10*3/uL (ref 0.0–0.1)
Basophils Relative: 1 %
Eosinophils Absolute: 0.1 10*3/uL (ref 0.0–0.5)
Eosinophils Relative: 2 %
HCT: 33.7 % — ABNORMAL LOW (ref 36.0–46.0)
Hemoglobin: 10.3 g/dL — ABNORMAL LOW (ref 12.0–15.0)
Immature Granulocytes: 0 %
Lymphocytes Relative: 34 %
Lymphs Abs: 1.7 10*3/uL (ref 0.7–4.0)
MCH: 26.1 pg (ref 26.0–34.0)
MCHC: 30.6 g/dL (ref 30.0–36.0)
MCV: 85.3 fL (ref 80.0–100.0)
Monocytes Absolute: 0.5 10*3/uL (ref 0.1–1.0)
Monocytes Relative: 9 %
Neutro Abs: 2.8 10*3/uL (ref 1.7–7.7)
Neutrophils Relative %: 54 %
Platelets: 237 10*3/uL (ref 150–400)
RBC: 3.95 MIL/uL (ref 3.87–5.11)
RDW: 17 % — ABNORMAL HIGH (ref 11.5–15.5)
WBC: 5.1 10*3/uL (ref 4.0–10.5)
nRBC: 0 % (ref 0.0–0.2)

## 2022-03-21 LAB — GLUCOSE, CAPILLARY
Glucose-Capillary: 77 mg/dL (ref 70–99)
Glucose-Capillary: 140 mg/dL — ABNORMAL HIGH (ref 70–99)
Glucose-Capillary: 137 mg/dL — ABNORMAL HIGH (ref 70–99)
Glucose-Capillary: 152 mg/dL — ABNORMAL HIGH (ref 70–99)

## 2022-03-21 LAB — URINE CULTURE: Culture: >=100000 — AB

## 2022-03-21 LAB — HEMOGLOBIN A1C
Hgb A1c MFr Bld: 5.9 % — ABNORMAL HIGH (ref 4.8–5.6)
Mean Plasma Glucose: 123 mg/dL

## 2022-03-21 MED ORDER — METOPROLOL TARTRATE 5 MG/5ML IV SOLN: 5.0000 mg | Freq: Three times a day (TID) | INTRAVENOUS | Status: DC | PRN

## 2022-03-21 MED ORDER — FLUTICASONE PROPIONATE 50 MCG/ACT NA SUSP
1.0000 | Freq: Every day | NASAL | Status: DC
  Administered 2022-03-21 – 2022-03-25 (×5): 1 via NASAL
  Filled 2022-03-21: qty 16

## 2022-03-21 NOTE — Progress Notes (Signed)
[facility]                                                                               [name] [name], is a 76 y.o. female, DOB - 01-13-1946, FTD:[id number] Admit date - 03/18/2022    Outpatient Primary MD for the patient is Hoyt Koch, MD  LOS - 3  days    Brief summary     [name] [name] is a 76 y.o. female with medical history significant for paroxysmal SVT, hypertension, type 2 diabetes, hearing loss, who presented to [facility] from the ED from SNF due to visual changes.  History is mainly obtained from EDP as the patient is a poor historian.  The patient has been having floaters in her vision for about weeks. In the ED, workup revealed UA positive for pyuria with concern for sepsis.  She was started on empiric IV antibiotics and TRH, hospitalist service, was asked to admit. CXR showing multifocal pneumonia.   Assessment & Plan    Assessment and Plan:  SIRS: Hypothermia improving.  From multifocal pneumonia and UTI:  Blood cultures growing staph epidermidis in aerobic bottles, possibly contaminant. Blood cultures repeated today.  Urine cultures showing e coli.  Started her on broad spectrum IV antibiotics.  Get sputum cultures if able.  She is currently on RA.  SLP eval cleared her for discharge.  Lactic acid wnl. RSV , covid19 PCR and influenza PCR is negative.    Paroxysmal SVT:  She is bradycardic. Was on lopressor. Will put her on PRN metoprolol.    Iron deficiency anemia/ anemia of chronic disease: Normocytic. Hemoglobin around 10.     Type 2 DM with  hyperglycemia:  CBG (last 3)  Recent Labs    03/20/22 2158 03/21/22 0753 03/21/22 1154  GLUCAP 113* 77 140*    Resume SSI.  Hemoglobin A1c is 5.8%.     Chronic anxiety and depression:  Resume zoloft.    Visual changes : MRI brain without contrast is negative for acute stroke.   Loose bowel movements -  Since one year, . Intermittent , not associated with nausea, vomiting  or abdominal pain.  Appears to have resolved.    Interventions: Refer to RD note for recommendations, Boost Plus, MVI  Estimated body mass index is 29.85 kg/m as calculated from the following:   Height as of this encounter: '5\' 11"'$  (1.803 m).   Weight as of this encounter: 97.1 kg.  Code Status: full code.  DVT Prophylaxis:  apixaban (ELIQUIS) tablet 2.5 mg Start: 03/19/22 1500 apixaban (ELIQUIS) tablet 2.5 mg   Level of Care: Level of care: Progressive Family Communication: none at bedside.   Disposition Plan:     Remains inpatient appropriate:  IV antibiotics.   Procedures:  None.   Consultants:   None.   Antimicrobials:   Anti-infectives (From admission, onward)    Start     Dose/Rate Route Frequency Ordered Stop   03/19/22 2359  cefTRIAXone (ROCEPHIN) 2 g in sodium chloride 0.9 % 100 mL IVPB        2 g 200 mL/hr over 30 Minutes Intravenous Every 24 hours 03/19/22 0545     03/19/22 1000  doxycycline (VIBRA-TABS) tablet 100 mg        100 mg Oral Every 12 hours 03/19/22 0545     03/18/22 2300  cefTRIAXone (ROCEPHIN) 1 g in sodium chloride 0.9 % 100 mL IVPB        1 g 200 mL/hr over 30 Minutes Intravenous  Once 03/18/22 2259 03/19/22 0401   03/18/22 2300  doxycycline (VIBRA-TABS) tablet 100 mg        100 mg Oral  Once 03/18/22 2259 03/18/22 2358        Medications  Scheduled Meds:  apixaban  2.5 mg Oral BID   doxycycline  100 mg Oral Q12H   ferrous sulfate  325 mg Oral Q breakfast   fluticasone  1 spray Each Nare Daily   folic acid  1 mg Oral Daily   insulin aspart  0-5 Units Subcutaneous QHS   insulin aspart  0-9 Units Subcutaneous TID WC   lactose free nutrition  237 mL Oral BID BM   loratadine  10 mg Oral Daily   montelukast  10 mg Oral QHS   multivitamin with minerals  1 tablet Oral Daily   pantoprazole  40 mg Oral Daily   polyvinyl alcohol  1 drop Both Eyes BID   sertraline  25 mg Oral Daily   thiamine  100 mg Oral Daily   Continuous Infusions:   cefTRIAXone (ROCEPHIN)  IV 2 g (03/20/22 2331)   PRN Meds:.    Subjective:   [name] [name] was seen and examined today. No diarrhea, no chest pain or sob.   Objective:   Vitals:   03/20/22 1600 03/20/22 2209 03/21/22 0509 03/21/22 0800  BP:  132/76 121/69   Pulse:  79 73   Resp:  16 15   Temp: 97.6 F (36.4 C) 98 F (36.7 C) 98.2 F (36.8 C)   TempSrc: Axillary Axillary Oral   SpO2:  97% 97% 98%  Weight:      Height:        Intake/Output Summary (Last 24 hours) at 03/21/2022 1243 Last data filed at 03/21/2022 0600 Gross per 24 hour  Intake 100 ml  Output 550 ml  Net -450 ml    Filed Weights   03/19/22 1347  Weight: 97.1 kg     Exam General exam: Appears calm and comfortable  Respiratory system: Clear to auscultation. Respiratory effort normal. Cardiovascular system: S1 & S2 heard, RRR. No JVD, murmurs,  Gastrointestinal system: Abdomen is nondistended, soft and nontender.  Central nervous system: Alert and oriented. No focal neurological deficits. Extremities: Symmetric 5 x 5 power. Skin: No rashes, lesions or ulcers Psychiatry:  Mood & affect appropriate.     Data Reviewed:  I have personally reviewed following labs and imaging studies   CBC Lab Results  Component Value Date   WBC 5.1 03/21/2022   RBC 3.95 03/21/2022   HGB 10.3 (L) 03/21/2022   HCT 33.7 (L) 03/21/2022   MCV 85.3 03/21/2022   MCH 26.1 03/21/2022   PLT 237 03/21/2022   MCHC 30.6 03/21/2022   RDW 17.0 (H) 03/21/2022   LYMPHSABS 1.7 03/21/2022   MONOABS 0.5 03/21/2022   EOSABS 0.1 03/21/2022   BASOSABS 0.0 62/26/3335     Last metabolic panel Lab Results  Component Value Date   NA 140 03/21/2022   K 4.8 03/21/2022   CL 107 03/21/2022   CO2 25 03/21/2022   BUN 34 (H) 03/21/2022   CREATININE 0.95 03/21/2022   GLUCOSE 74 03/21/2022   GFRNONAA >  60 03/21/2022   GFRAA 41 (L) 01/09/2019   CALCIUM 9.5 03/21/2022   PHOS 3.7 03/19/2022   PROT 6.4 (L) 02/16/2021   ALBUMIN  2.2 (L) 02/16/2021   BILITOT 0.6 02/16/2021   ALKPHOS 82 02/16/2021   AST 16 02/16/2021   ALT 17 02/16/2021   ANIONGAP 8 03/21/2022    CBG (last 3)  Recent Labs    03/20/22 2158 03/21/22 0753 03/21/22 1154  GLUCAP 113* 77 140*       Coagulation Profile: No results for input(s): "INR", "PROTIME" in the last 168 hours.   Radiology Studies: No results found.     Hosie Poisson M.D. [facility] 03/21/2022, 12:43 PM  Available via Epic secure chat 7am-7pm After 7 pm, please refer to night coverage provider listed on amion.

## 2022-03-21 NOTE — Consult Note (Signed)
[facility] Nurse Consult Note: Reason for Consult:Patient with no wounds to LEs, has HHRN for application of Unna's Boots for lymphedema three times weekly. She is known to our department from previous visits over the past 3 years for MASD (moisture related skin issues) and a right thigh shear injury from sliding during transfers. We will initiate POC for twice weekly changes of Unna's boots while in house.  Wound type:N/A. Patient with lymphedema venous insufficiency Pressure Injury POA: N/A Measurement:N/A Wound bed:N/A Drainage (amount, consistency, odor) None Periwound:edematous Dressing procedure/placement/frequency:I have initiated a PI prevention POC using a silicone foam to the sacrum and turning and repositioning to minimize time in the supine position. Prevalon boots are ordered. Ortho Tech to place bilateral Unna's boots twice weekly while in house.  Recommend patient resume care with [facility] post discharge when frequency of Unna's Boot changes may increase to 3 times weekly or stay as they are here, twice weekly. If you agree, please order at discharge.  [facility] nursing team will not follow, but will remain available to this patient, the nursing and medical teams.  Please re-consult if needed.  Thank you for inviting [place] to participate in this patient's Plan of Care.  Maudie Flakes, MSN, RN, CNS, [place], Serita Grammes, [employer], [employer] phone:  [phone]

## 2022-03-21 NOTE — Plan of Care (Signed)
  Problem: Education: Goal: Ability to describe self-care measures that may prevent or decrease complications (Diabetes Survival Skills Education) will improve Outcome: Progressing Goal: Individualized Educational Video(s) Outcome: Progressing   Problem: Coping: Goal: Ability to adjust to condition or change in health will improve Outcome: Progressing   Problem: Fluid Volume: Goal: Ability to maintain a balanced intake and output will improve Outcome: Not Progressing Note: R/t fluid in BLE   Problem: Health Behavior/Discharge Planning: Goal: Ability to identify and utilize available resources and services will improve Outcome: Progressing Goal: Ability to manage health-related needs will improve Outcome: Progressing   Problem: Metabolic: Goal: Ability to maintain appropriate glucose levels will improve Outcome: Progressing   Problem: Nutritional: Goal: Maintenance of adequate nutrition will improve Outcome: Progressing Goal: Progress toward achieving an optimal weight will improve Outcome: Progressing   Problem: Skin Integrity: Goal: Risk for impaired skin integrity will decrease Outcome: Progressing   Problem: Tissue Perfusion: Goal: Adequacy of tissue perfusion will improve Outcome: Progressing   Problem: Education: Goal: Knowledge of General Education information will improve Description: Including pain rating scale, medication(s)/side effects and non-pharmacologic comfort measures Outcome: Progressing   Problem: Health Behavior/Discharge Planning: Goal: Ability to manage health-related needs will improve Outcome: Progressing   Problem: Clinical Measurements: Goal: Ability to maintain clinical measurements within normal limits will improve Outcome: Progressing Goal: Will remain free from infection Outcome: Progressing Goal: Diagnostic test results will improve Outcome: Progressing Goal: Respiratory complications will improve Outcome: Progressing Goal:  Cardiovascular complication will be avoided Outcome: Progressing   Problem: Activity: Goal: Risk for activity intolerance will decrease Outcome: Not Progressing Note: R/t pt WC bound last 2 years    Problem: Nutrition: Goal: Adequate nutrition will be maintained Outcome: Progressing   Problem: Coping: Goal: Level of anxiety will decrease Outcome: Progressing   Problem: Elimination: Goal: Will not experience complications related to bowel motility Outcome: Progressing Goal: Will not experience complications related to urinary retention Outcome: Not Progressing Note: R/t current UTI   Problem: Pain Managment: Goal: General experience of comfort will improve Outcome: Progressing   Problem: Safety: Goal: Ability to remain free from injury will improve Outcome: Not Progressing Note: R/t decrease movement in BLE weakness and swelling   Problem: Skin Integrity: Goal: Risk for impaired skin integrity will decrease Outcome: Progressing

## 2022-03-21 NOTE — Progress Notes (Signed)
Charge nurse Hinton Dyer, and this Probation officer both called and left voicemails for the ortho tech to come do una boots on patient. Were unable to reach ortho tech. Will try again tomorrow.

## 2022-03-21 NOTE — Progress Notes (Addendum)
1930 patient alert x 4 able to make all needs known eating dinner at this time 2100 all meds given PO as ordered

## 2022-03-22 DIAGNOSIS — R6520 Severe sepsis without septic shock: Secondary | ICD-10-CM | POA: Diagnosis not present

## 2022-03-22 DIAGNOSIS — A419 Sepsis, unspecified organism: Secondary | ICD-10-CM | POA: Diagnosis not present

## 2022-03-22 DIAGNOSIS — T68XXXA Hypothermia, initial encounter: Secondary | ICD-10-CM | POA: Diagnosis not present

## 2022-03-22 DIAGNOSIS — J189 Pneumonia, unspecified organism: Secondary | ICD-10-CM | POA: Diagnosis not present

## 2022-03-22 LAB — CBC WITH DIFFERENTIAL/PLATELET
Abs Immature Granulocytes: 0.02 10*3/uL (ref 0.00–0.07)
Basophils Absolute: 0 10*3/uL (ref 0.0–0.1)
Basophils Relative: 1 %
Eosinophils Absolute: 0.1 10*3/uL (ref 0.0–0.5)
Eosinophils Relative: 2 %
HCT: 36 % (ref 36.0–46.0)
Hemoglobin: 10.6 g/dL — ABNORMAL LOW (ref 12.0–15.0)
Immature Granulocytes: 0 %
Lymphocytes Relative: 36 %
Lymphs Abs: 1.9 10*3/uL (ref 0.7–4.0)
MCH: 25.5 pg — ABNORMAL LOW (ref 26.0–34.0)
MCHC: 29.4 g/dL — ABNORMAL LOW (ref 30.0–36.0)
MCV: 86.7 fL (ref 80.0–100.0)
Monocytes Absolute: 0.5 10*3/uL (ref 0.1–1.0)
Monocytes Relative: 10 %
Neutro Abs: 2.7 10*3/uL (ref 1.7–7.7)
Neutrophils Relative %: 51 %
Platelets: 239 10*3/uL (ref 150–400)
RBC: 4.15 MIL/uL (ref 3.87–5.11)
RDW: 17.1 % — ABNORMAL HIGH (ref 11.5–15.5)
WBC: 5.3 10*3/uL (ref 4.0–10.5)
nRBC: 0 % (ref 0.0–0.2)

## 2022-03-22 LAB — GLUCOSE, CAPILLARY
Glucose-Capillary: 84 mg/dL (ref 70–99)
Glucose-Capillary: 131 mg/dL — ABNORMAL HIGH (ref 70–99)
Glucose-Capillary: 141 mg/dL — ABNORMAL HIGH (ref 70–99)
Glucose-Capillary: 141 mg/dL — ABNORMAL HIGH (ref 70–99)

## 2022-03-22 LAB — BASIC METABOLIC PANEL
Anion gap: 9 (ref 5–15)
BUN: 38 mg/dL — ABNORMAL HIGH (ref 8–23)
CO2: 25 mmol/L (ref 22–32)
Calcium: 9.4 mg/dL (ref 8.9–10.3)
Chloride: 106 mmol/L (ref 98–111)
Creatinine, Ser: 1.01 mg/dL — ABNORMAL HIGH (ref 0.44–1.00)
GFR, Estimated: 58 mL/min — ABNORMAL LOW (ref >60)
Glucose, Bld: 82 mg/dL (ref 70–99)
Potassium: 4.9 mmol/L (ref 3.5–5.1)
Sodium: 140 mmol/L (ref 135–145)

## 2022-03-22 NOTE — Progress Notes (Signed)
1910 patient alert x 4 able to make all needs known vitals obtain and patient repositioned in bed

## 2022-03-22 NOTE — Progress Notes (Signed)
Attempted to call ortho tech again, left voicemail for [employer] will await a phone call back.

## 2022-03-22 NOTE — Progress Notes (Signed)
[facility]                                                                               [name] [name], is a 76 y.o. female, DOB - [birth year], FYB:[id number] Admit date - 03/18/2022    Outpatient Primary MD for the patient is Hoyt Koch, MD  LOS - 4  days    Brief summary     [name] [name] is a 76 y.o. female with medical history significant for paroxysmal SVT, hypertension, type 2 diabetes, hearing loss, who presented to [facility] from the ED from SNF due to visual changes.  History is mainly obtained from EDP as the patient is a poor historian.  The patient has been having floaters in her vision for about weeks. In the ED, workup revealed UA positive for pyuria with concern for sepsis.  She was started on empiric IV antibiotics and TRH, hospitalist service, was asked to admit. CXR showing multifocal pneumonia.   Assessment & Plan    Assessment and Plan:  SIRS: Hypothermia resolved.  From multifocal pneumonia and UTI:  Blood cultures growing staph epidermidis in aerobic bottles, possibly contaminant. Blood cultures repeated and negative so far.  Urine cultures showing e coli, pan sensitive.  Started her on broad spectrum IV antibiotics, possible transition to oral antibiotics in am prior to discharge. .   Get sputum cultures if able.  She is currently on RA.  SLP eval cleared her for discharge.  Lactic acid wnl. RSV , covid19 PCR and influenza PCR is negative.    Paroxysmal SVT:  She is bradycardic. Was on lopressor. Will put her on PRN metoprolol.    Iron deficiency anemia/ anemia of chronic disease: Normocytic. Hemoglobin around 10.     Type 2 DM with  hyperglycemia:  CBG (last 3)  Recent Labs    03/21/22 1649 03/21/22 2048 03/22/22 0823  GLUCAP 137* 152* 84    Resume SSI. No changes in meds.  Hemoglobin A1c is 5.8%.      Chronic anxiety and depression:  Resume zoloft.    Visual changes : MRI brain without contrast is  negative for acute stroke.   Loose bowel movements -  Since one year, . Intermittent , not associated with nausea, vomiting or abdominal pain.  Appears to have resolved.     Bilateral lower extremity edema /Lymphedema/ Venous insufficiency in the lower extremities.  Unna boots     Interventions: Refer to RD note for recommendations, Boost Plus, MVI  Estimated body mass index is 29.85 kg/m as calculated from the following:   Height as of this encounter: '5\' 11"'$  (1.803 m).   Weight as of this encounter: 97.1 kg.  Code Status: full code.  DVT Prophylaxis:  apixaban (ELIQUIS) tablet 2.5 mg Start: 03/19/22 1500 apixaban (ELIQUIS) tablet 2.5 mg   Level of Care: Level of care: Progressive Family Communication: none at bedside.   Disposition Plan:     Remains inpatient appropriate:  IV antibiotics.   Procedures:  None.   Consultants:   None.   Antimicrobials:   Anti-infectives (From admission, onward)    Start     Dose/Rate Route Frequency Ordered Stop  03/19/22 2359  cefTRIAXone (ROCEPHIN) 2 g in sodium chloride 0.9 % 100 mL IVPB        2 g 200 mL/hr over 30 Minutes Intravenous Every 24 hours 03/19/22 0545     03/19/22 1000  doxycycline (VIBRA-TABS) tablet 100 mg        100 mg Oral Every 12 hours 03/19/22 0545     03/18/22 2300  cefTRIAXone (ROCEPHIN) 1 g in sodium chloride 0.9 % 100 mL IVPB        1 g 200 mL/hr over 30 Minutes Intravenous  Once 03/18/22 2259 03/19/22 0401   03/18/22 2300  doxycycline (VIBRA-TABS) tablet 100 mg        100 mg Oral  Once 03/18/22 2259 03/18/22 2358        Medications  Scheduled Meds:  apixaban  2.5 mg Oral BID   doxycycline  100 mg Oral Q12H   ferrous sulfate  325 mg Oral Q breakfast   fluticasone  1 spray Each Nare Daily   folic acid  1 mg Oral Daily   insulin aspart  0-5 Units Subcutaneous QHS   insulin aspart  0-9 Units Subcutaneous TID WC   lactose free nutrition  237 mL Oral BID BM   loratadine  10 mg Oral Daily    montelukast  10 mg Oral QHS   multivitamin with minerals  1 tablet Oral Daily   pantoprazole  40 mg Oral Daily   polyvinyl alcohol  1 drop Both Eyes BID   sertraline  25 mg Oral Daily   thiamine  100 mg Oral Daily   Continuous Infusions:  cefTRIAXone (ROCEPHIN)  IV Stopped (03/22/22 0034)   PRN Meds:.    Subjective:   [name] [name] was seen and examined today. No chest pain or sob. No nausea or abd pain.   Objective:   Vitals:   03/21/22 1309 03/21/22 2051 03/22/22 0433 03/22/22 0800  BP: 112/78 115/67 118/70   Pulse: 83 (!) 42 75   Resp: '18 18 20   '$ Temp: 99.1 F (37.3 C) 98.4 F (36.9 C) (!) 97.5 F (36.4 C)   TempSrc: Oral Oral Oral   SpO2: 97% 95% 98% 98%  Weight:      Height:        Intake/Output Summary (Last 24 hours) at 03/22/2022 1027 Last data filed at 03/22/2022 0534 Gross per 24 hour  Intake 338.13 ml  Output 802 ml  Net -463.87 ml    Filed Weights   03/19/22 1347  Weight: 97.1 kg     Exam General exam: Appears calm and comfortable  Respiratory system: Clear to auscultation. Respiratory effort normal. Cardiovascular system: S1 & S2 heard, RRR. No JVD, murmurs, rubs, gallops or clicks.  Gastrointestinal system: Abdomen is nondistended, soft and nontender.  Central nervous system: Alert and oriented. No focal neurological deficits. Extremities: bilateral lymphaedema.  Skin: No rashes, lesions or ulcers Psychiatry: JMood & affect appropriate.     Data Reviewed:  I have personally reviewed following labs and imaging studies   CBC Lab Results  Component Value Date   WBC 5.3 03/22/2022   RBC 4.15 03/22/2022   HGB 10.6 (L) 03/22/2022   HCT 36.0 03/22/2022   MCV 86.7 03/22/2022   MCH 25.5 (L) 03/22/2022   PLT 239 03/22/2022   MCHC 29.4 (L) 03/22/2022   RDW 17.1 (H) 03/22/2022   LYMPHSABS 1.9 03/22/2022   MONOABS 0.5 03/22/2022   EOSABS 0.1 03/22/2022   BASOSABS 0.0 03/22/2022  Last metabolic panel Lab Results  Component  Value Date   NA 140 03/22/2022   K 4.9 03/22/2022   CL 106 03/22/2022   CO2 25 03/22/2022   BUN 38 (H) 03/22/2022   CREATININE 1.01 (H) 03/22/2022   GLUCOSE 82 03/22/2022   GFRNONAA 58 (L) 03/22/2022   GFRAA 41 (L) 01/09/2019   CALCIUM 9.4 03/22/2022   PHOS 3.7 03/19/2022   PROT 6.4 (L) 02/16/2021   ALBUMIN 2.2 (L) 02/16/2021   BILITOT 0.6 02/16/2021   ALKPHOS 82 02/16/2021   AST 16 02/16/2021   ALT 17 02/16/2021   ANIONGAP 9 03/22/2022    CBG (last 3)  Recent Labs    03/21/22 1649 03/21/22 2048 03/22/22 0823  GLUCAP 137* 152* 84       Coagulation Profile: No results for input(s): "INR", "PROTIME" in the last 168 hours.   Radiology Studies: No results found.     Hosie Poisson M.D. [facility] 03/22/2022, 10:27 AM  Available via Epic secure chat 7am-7pm After 7 pm, please refer to night coverage provider listed on amion.

## 2022-03-23 ENCOUNTER — Inpatient Hospital Stay (HOSPITAL_COMMUNITY): Payer: PPO

## 2022-03-23 DIAGNOSIS — T68XXXA Hypothermia, initial encounter: Secondary | ICD-10-CM | POA: Diagnosis not present

## 2022-03-23 DIAGNOSIS — A419 Sepsis, unspecified organism: Secondary | ICD-10-CM | POA: Diagnosis not present

## 2022-03-23 DIAGNOSIS — R6520 Severe sepsis without septic shock: Secondary | ICD-10-CM | POA: Diagnosis not present

## 2022-03-23 DIAGNOSIS — J189 Pneumonia, unspecified organism: Secondary | ICD-10-CM | POA: Diagnosis not present

## 2022-03-23 LAB — GLUCOSE, CAPILLARY
Glucose-Capillary: 107 mg/dL — ABNORMAL HIGH (ref 70–99)
Glucose-Capillary: 116 mg/dL — ABNORMAL HIGH (ref 70–99)
Glucose-Capillary: 145 mg/dL — ABNORMAL HIGH (ref 70–99)
Glucose-Capillary: 164 mg/dL — ABNORMAL HIGH (ref 70–99)

## 2022-03-23 MED ORDER — COLCHICINE 0.6 MG PO TABS
0.6000 mg | ORAL_TABLET | Freq: Two times a day (BID) | ORAL | Status: DC
  Administered 2022-03-23 – 2022-03-25 (×5): 0.6 mg via ORAL
  Filled 2022-03-23 (×5): qty 1

## 2022-03-23 MED ORDER — MUSCLE RUB 10-15 % EX CREA
TOPICAL_CREAM | CUTANEOUS | Status: DC | PRN
  Filled 2022-03-23: qty 85

## 2022-03-23 NOTE — Progress Notes (Signed)
Physical Therapy Treatment Patient Details Name: [name] [name] MRN: [id number] DOB: 1945/08/16 Today's Date: 03/23/2022   History of Present Illness Pt is a 76 year old female admitted with SIRS from multifocal pneumonia and UTI; PMH significant for paroxysmal SVT, hypertension, type 2 diabetes, hearing loss    PT Comments    General Comments: AxO x 2 with intermittent confusion to current time/situations.  Groggy/sleepy.  Fell asleep with lunch tray still on lap. General bed mobility comments: pt required increased assist this session to transition to EOB using bed pad to complete scooting.  Static sitting balance is good.  Able to sit EOB x 6 min at Supervision level. General transfer comment: unable to attempt transfer due to lack od + 2 asisst.  Pt stated "sometimes I use a sliding board".  "Sometimes they use a lift".  Pt unable to stand and stated she has not walked since 2021 when she was at "[facility]". Returned back to supine and positioned to comfort.  Pt feeling "more tired today". Pt will need ST Rehab at SNF to address mobility and functional decline prior to safely returning home.   Recommendations for follow up therapy are one component of a multi-disciplinary discharge planning process, led by the attending physician.  Recommendations may be updated based on patient status, additional functional criteria and insurance authorization.  Follow Up Recommendations  Skilled nursing-short term rehab (<3 hours/day) Can patient physically be transported by private vehicle: No   Assistance Recommended at Discharge Intermittent Supervision/Assistance  Patient can return home with the following A little help with walking and/or transfers;A little help with bathing/dressing/bathroom;Assistance with cooking/housework;Assist for transportation;Help with stairs or ramp for entrance   Equipment Recommendations  None recommended by PT    Recommendations for Other Services        Precautions / Restrictions Precautions Precautions: Fall Precaution Comments: non amb since 2021 Restrictions Weight Bearing Restrictions: No     Mobility  Bed Mobility Overal bed mobility: Needs Assistance Bed Mobility: Supine to Sit, Sit to Supine     Supine to sit: Max assist Sit to supine: Max assist, Total assist, +2 for physical assistance, +2 for safety/equipment   General bed mobility comments: pt required increased assist this session to transition to EOB using bed pad to complete scooting.  Static sitting balance is good.  Able to sit EOB x 6 min at Supervision level.    Transfers                   General transfer comment: unable to attempt transfer due to lack od + 2 asisst.  Pt stated "sometimes I use a sliding board".  "Sometimes they use a lift".  Pt unable to stand and stated she has not walked since 2021 when she was at "[facility]".    Ambulation/Gait                   Stairs             Wheelchair Mobility    Modified Rankin (Stroke Patients Only)       Balance                                            Cognition Arousal/Alertness: Awake/alert Behavior During Therapy: WFL for tasks assessed/performed Overall Cognitive Status: No family/caregiver present to determine baseline cognitive functioning  General Comments: AxO x 2 with intermittent confusion to current time/situations.  Groggy/sleepy.  Fell asleep with lunch tray still on lap.        Exercises      General Comments        Pertinent Vitals/Pain Pain Assessment Pain Assessment: Faces Faces Pain Scale: Hurts little more Pain Location: B LE "Neuropathy" stated pt Pain Descriptors / Indicators: Aching, Burning Pain Intervention(s): Monitored during session, Repositioned    Home Living                          Prior Function            PT Goals (current goals can now be  found in the care plan section) Progress towards PT goals: Progressing toward goals    Frequency    Min 2X/week      PT Plan Current plan remains appropriate    Co-evaluation              AM-PAC PT "6 Clicks" Mobility   Outcome Measure  Help needed turning from your back to your side while in a flat bed without using bedrails?: A Lot Help needed moving from lying on your back to sitting on the side of a flat bed without using bedrails?: A Lot Help needed moving to and from a bed to a chair (including a wheelchair)?: Total Help needed standing up from a chair using your arms (e.g., wheelchair or bedside chair)?: Total Help needed to walk in hospital room?: Total Help needed climbing 3-5 steps with a railing? : Total 6 Click Score: 8    End of Session Equipment Utilized During Treatment: Gait belt Activity Tolerance: Patient limited by fatigue Patient left: in bed;with bed alarm set;with call bell/phone within reach Nurse Communication: Mobility status PT Visit Diagnosis: Muscle weakness (generalized) (M62.81);Other abnormalities of gait and mobility (R26.89)     Time: 7673-4193 PT Time Calculation (min) (ACUTE ONLY): 25 min  Charges:  $Therapeutic Activity: 23-37 mins                     Rica Koyanagi  PTA Acute  Rehabilitation Services Office M-F          [phone] Weekend pager [phone]

## 2022-03-23 NOTE — Progress Notes (Signed)
[facility]                                                                               [name] [name], is a 76 y.o. female, DOB - [birth year], BJS:[id number] Admit date - 03/18/2022    Outpatient Primary MD for the patient is Hoyt Koch, MD  LOS - 5  days    Brief summary     [name] [name] is a 76 y.o. female with medical history significant for paroxysmal SVT, hypertension, type 2 diabetes, hearing loss, who presented to [facility] from the ED from SNF due to visual changes.  History is mainly obtained from EDP as the patient is a poor historian.  The patient has been having floaters in her vision for about weeks. In the ED, workup revealed UA positive for pyuria with concern for sepsis.  She was started on empiric IV antibiotics and TRH, hospitalist service, was asked to admit. CXR showing multifocal pneumonia.   Assessment & Plan    Assessment and Plan:  SIRS: Hypothermia resolved.  From multifocal pneumonia and UTI:  Blood cultures growing staph epidermidis in aerobic bottles, possibly contaminant. Blood cultures repeated and negative so far.  Urine cultures showing e coli, pan sensitive.  Started her on broad spectrum IV antibiotics, possible transition to oral antibiotics in am prior to discharge. .   Get sputum cultures if able.  She is currently on RA.  SLP eval cleared her for discharge.  Lactic acid wnl. RSV , covid19 PCR and influenza PCR is negative.    Paroxysmal SVT:  She is bradycardic. Was on lopressor. Will put her on PRN metoprolol.    Iron deficiency anemia/ anemia of chronic disease: Normocytic. Hemoglobin around 10.     Type 2 DM with  hyperglycemia:  CBG (last 3)  Recent Labs    03/23/22 0723 03/23/22 1113 03/23/22 1622  GLUCAP 107* 116* 145*    Resume SSI. No changes in meds.  Hemoglobin A1c is 5.8%.      Chronic anxiety and depression:  Resume zoloft.    Visual changes : MRI brain without contrast is  negative for acute stroke.   Loose bowel movements -  Since one year, . Intermittent , not associated with nausea, vomiting or abdominal pain.  Appears to have resolved.     Bilateral lower extremity edema /Lymphedema/ Venous insufficiency in the lower extremities.  Unna boots    Lower extremity edema with left ankle pain:  Get x rays suspect gout. Started her on colchicine.  Get Venous duplex of the lower extremities.     Interventions: Refer to RD note for recommendations, Boost Plus, MVI  Estimated body mass index is 29.85 kg/m as calculated from the following:   Height as of this encounter: '5\' 11"'$  (1.803 m).   Weight as of this encounter: 97.1 kg.  Code Status: full code.  DVT Prophylaxis:  apixaban (ELIQUIS) tablet 2.5 mg Start: 03/19/22 1500 apixaban (ELIQUIS) tablet 2.5 mg   Level of Care: Level of care: Progressive Family Communication: none at bedside.   Disposition Plan:     Remains inpatient appropriate:  IV antibiotics.   Procedures:  None.  Consultants:   None.   Antimicrobials:   Anti-infectives (From admission, onward)    Start     Dose/Rate Route Frequency Ordered Stop   03/19/22 2359  cefTRIAXone (ROCEPHIN) 2 g in sodium chloride 0.9 % 100 mL IVPB        2 g 200 mL/hr over 30 Minutes Intravenous Every 24 hours 03/19/22 0545 03/22/22 2149   03/19/22 1000  doxycycline (VIBRA-TABS) tablet 100 mg        100 mg Oral Every 12 hours 03/19/22 0545 03/22/22 2116   03/18/22 2300  cefTRIAXone (ROCEPHIN) 1 g in sodium chloride 0.9 % 100 mL IVPB        1 g 200 mL/hr over 30 Minutes Intravenous  Once 03/18/22 2259 03/19/22 0401   03/18/22 2300  doxycycline (VIBRA-TABS) tablet 100 mg        100 mg Oral  Once 03/18/22 2259 03/18/22 2358        Medications  Scheduled Meds:  apixaban  2.5 mg Oral BID   colchicine  0.6 mg Oral BID   ferrous sulfate  325 mg Oral Q breakfast   fluticasone  1 spray Each Nare Daily   folic acid  1 mg Oral Daily   insulin  aspart  0-5 Units Subcutaneous QHS   insulin aspart  0-9 Units Subcutaneous TID WC   lactose free nutrition  237 mL Oral BID BM   loratadine  10 mg Oral Daily   montelukast  10 mg Oral QHS   multivitamin with minerals  1 tablet Oral Daily   pantoprazole  40 mg Oral Daily   polyvinyl alcohol  1 drop Both Eyes BID   sertraline  25 mg Oral Daily   thiamine  100 mg Oral Daily   Continuous Infusions:   PRN Meds:.    Subjective:   Thomasa Bares was seen and examined today. Pain in the right hand and right finger. Ankle pain on the left.   Objective:   Vitals:   03/22/22 1408 03/22/22 2000 03/23/22 0539 03/23/22 1240  BP: 110/66 110/63 133/67 128/63  Pulse: 75 71 60 69  Resp: '18 17 16 19  '$ Temp: 98.4 F (36.9 C) 97.9 F (36.6 C) 98 F (36.7 C) 98.3 F (36.8 C)  TempSrc: Oral Oral Oral Oral  SpO2: 100% 97% 98% 98%  Weight:      Height:        Intake/Output Summary (Last 24 hours) at 03/23/2022 1634 Last data filed at 03/23/2022 0820 Gross per 24 hour  Intake 438.09 ml  Output 1151 ml  Net -712.91 ml    Filed Weights   03/19/22 1347  Weight: 97.1 kg     Exam General exam: Appears calm and comfortable  Respiratory system: Clear to auscultation. Respiratory effort normal. Cardiovascular system: S1 & S2 heard, RRR. No JVD, murmurs,  Gastrointestinal system: Abdomen is nondistended, soft and nontender.  Central nervous system: Alert and oriented. No focal neurological deficits. Extremities: left ankle tenderness and swelling of the lower extremities.  Skin: No rashes, lesions or ulcers Psychiatry: Mood & affect appropriate.      Data Reviewed:  I have personally reviewed following labs and imaging studies   CBC Lab Results  Component Value Date   WBC 5.3 03/22/2022   RBC 4.15 03/22/2022   HGB 10.6 (L) 03/22/2022   HCT 36.0 03/22/2022   MCV 86.7 03/22/2022   MCH 25.5 (L) 03/22/2022   PLT 239 03/22/2022   MCHC 29.4 (L) 03/22/2022  RDW 17.1 (H)  03/22/2022   LYMPHSABS 1.9 03/22/2022   MONOABS 0.5 03/22/2022   EOSABS 0.1 03/22/2022   BASOSABS 0.0 62/95/2841     Last metabolic panel Lab Results  Component Value Date   NA 140 03/22/2022   K 4.9 03/22/2022   CL 106 03/22/2022   CO2 25 03/22/2022   BUN 38 (H) 03/22/2022   CREATININE 1.01 (H) 03/22/2022   GLUCOSE 82 03/22/2022   GFRNONAA 58 (L) 03/22/2022   GFRAA 41 (L) 01/09/2019   CALCIUM 9.4 03/22/2022   PHOS 3.7 03/19/2022   PROT 6.4 (L) 02/16/2021   ALBUMIN 2.2 (L) 02/16/2021   BILITOT 0.6 02/16/2021   ALKPHOS 82 02/16/2021   AST 16 02/16/2021   ALT 17 02/16/2021   ANIONGAP 9 03/22/2022    CBG (last 3)  Recent Labs    03/23/22 0723 03/23/22 1113 03/23/22 1622  GLUCAP 107* 116* 145*       Coagulation Profile: No results for input(s): "INR", "PROTIME" in the last 168 hours.   Radiology Studies: No results found.     Hosie Poisson M.D. [facility] 03/23/2022, 4:34 PM  Available via Epic secure chat 7am-7pm After 7 pm, please refer to night coverage provider listed on amion.

## 2022-03-23 NOTE — Progress Notes (Signed)
Orthopedic Tech Progress Note Patient Details:  [name] [name] August 02, 1945 [id number]  Ortho Devices Type of Ortho Device: Haematologist Ortho Device/Splint Location: BLE Ortho Device/Splint Interventions: Ordered, Application, Adjustment   Post Interventions Patient Tolerated: Well Instructions Provided: Care of device  [name] 03/23/2022, 3:45 PM

## 2022-03-24 ENCOUNTER — Inpatient Hospital Stay (HOSPITAL_COMMUNITY): Payer: PPO

## 2022-03-24 DIAGNOSIS — A419 Sepsis, unspecified organism: Secondary | ICD-10-CM | POA: Diagnosis not present

## 2022-03-24 DIAGNOSIS — M7989 Other specified soft tissue disorders: Secondary | ICD-10-CM

## 2022-03-24 DIAGNOSIS — T68XXXA Hypothermia, initial encounter: Secondary | ICD-10-CM | POA: Diagnosis not present

## 2022-03-24 DIAGNOSIS — J189 Pneumonia, unspecified organism: Secondary | ICD-10-CM | POA: Diagnosis not present

## 2022-03-24 DIAGNOSIS — R6520 Severe sepsis without septic shock: Secondary | ICD-10-CM | POA: Diagnosis not present

## 2022-03-24 LAB — CULTURE, BLOOD (ROUTINE X 2)
Culture: NO GROWTH
Special Requests: ADEQUATE

## 2022-03-24 LAB — GLUCOSE, CAPILLARY
Glucose-Capillary: 104 mg/dL — ABNORMAL HIGH (ref 70–99)
Glucose-Capillary: 125 mg/dL — ABNORMAL HIGH (ref 70–99)
Glucose-Capillary: 176 mg/dL — ABNORMAL HIGH (ref 70–99)
Glucose-Capillary: 112 mg/dL — ABNORMAL HIGH (ref 70–99)

## 2022-03-24 MED ORDER — ORAL CARE MOUTH RINSE: 15.0000 mL | OROMUCOSAL | Status: DC | PRN

## 2022-03-24 NOTE — Plan of Care (Signed)
  Problem: Fluid Volume: Goal: Ability to maintain a balanced intake and output will improve Outcome: Progressing   Problem: Metabolic: Goal: Ability to maintain appropriate glucose levels will improve Outcome: Progressing   Problem: Skin Integrity: Goal: Risk for impaired skin integrity will decrease Outcome: Progressing   Problem: Tissue Perfusion: Goal: Adequacy of tissue perfusion will improve Outcome: Progressing   Problem: Activity: Goal: Risk for activity intolerance will decrease Outcome: Progressing   Problem: Pain Managment: Goal: General experience of comfort will improve Outcome: Progressing   Problem: Safety: Goal: Ability to remain free from injury will improve Outcome: Progressing

## 2022-03-24 NOTE — Progress Notes (Signed)
Bilateral lower extremity venous duplex has been completed. Preliminary results can be found in CV Proc through chart review.   03/24/22 9:55 AM [name] [name] RVT

## 2022-03-24 NOTE — Progress Notes (Signed)
Occupational Therapy Treatment Patient Details Name: [name] [name] MRN: [id number] DOB: 1946/01/01 Today's Date: 03/24/2022   History of present illness Pt is a 76 year old female admitted with SIRS from multifocal pneumonia and UTI; PMH significant for paroxysmal SVT, hypertension, type 2 diabetes, hearing loss   OT comments  Patient reported feelings of generalized stiffness & muscle soreness, which she attributed to decreased functional activity. She tolerated sitting edge of bed with SBA, while being instructed on, then performing BUE and BLE stretches and therapeutic exercises, needed for improved joint flexibility and muscle strengthening. She also performed grooming with set-up assist seated EOB. She presented with occasional posterior lean in sitting, however she could correct with verbal cues. She was further educated on lateral scooting to the head of the bed, however she was unable to perform. She needed max assist for return to supine. Continue OT plan of care.    Recommendations for follow up therapy are one component of a multi-disciplinary discharge planning process, led by the attending physician.  Recommendations may be updated based on patient status, additional functional criteria and insurance authorization.    Follow Up Recommendations  Skilled nursing-short term rehab (<3 hours/day)     Assistance Recommended at Discharge Frequent or constant Supervision/Assistance  Patient can return home with the following  A lot of help with walking and/or transfers;A lot of help with bathing/dressing/bathroom   Equipment Recommendations  Other (comment) (to be determined pending progress at next setting)       Precautions / Restrictions Precautions Precautions: Fall Precaution Comments: non-ambulatory for a couple years; uses slide board Restrictions Weight Bearing Restrictions: No       Mobility Bed Mobility Overal bed mobility: Needs Assistance Bed Mobility: Supine to  Sit, Sit to Supine     Supine to sit: Mod assist, HOB elevated Sit to supine: Max assist   General bed mobility comments: She required increased time and effort for supine to sit & sit to supine. Instruction was required on using bedrail, pulling on bed rail and trunk shifting           ADL either performed or assessed with clinical judgement   ADL Overall ADL's : Needs assistance/impaired Eating/Feeding: Set up;Sitting   Grooming: Sitting;Set up;Wash/dry face Grooming Details (indicate cue type and reason): She performed face washing seated EOB.         Upper Body Dressing : Minimal assistance   Lower Body Dressing: Maximal assistance                                 Cognition Arousal/Alertness: Awake/alert Behavior During Therapy: WFL for tasks assessed/performed                    Pertinent Vitals/ Pain       Pain Assessment Pain Assessment: 0-10 Pain Score: 5  Pain Location: generalized Pain Intervention(s): Limited activity within patient's tolerance, Repositioned         Frequency  Min 1X/week        Progress Toward Goals  OT Goals(current goals can now be found in the care plan section)  Progress towards OT goals: Progressing toward goals  Acute Rehab OT Goals Patient Stated Goal: to be able to stand again OT Goal Formulation: With patient Time For Goal Achievement: 04/03/22 Potential to Achieve Goals: [place] Discharge plan remains appropriate       AM-PAC OT "6 Clicks" Daily Activity  Outcome Measure   Help from another person eating meals?: None Help from another person taking care of personal grooming?: None Help from another person toileting, which includes using toliet, bedpan, or urinal?: A Lot Help from another person bathing (including washing, rinsing, drying)?: A Lot Help from another person to put on and taking off regular upper body clothing?: A Little Help from another person to put on and taking off  regular lower body clothing?: A Lot 6 Click Score: 17    End of Session    OT Visit Diagnosis: Muscle weakness (generalized) (M62.81);Pain   Activity Tolerance Patient tolerated treatment well   Patient Left in bed;with call bell/phone within reach;with bed alarm set   Nurse Communication Mobility status        Time: 5374-8270 OT Time Calculation (min): 35 min  Charges: OT General Charges $OT Visit: 1 Visit OT Treatments $Therapeutic Activity: 23-37 mins     Leota Sauers, OTR/L 03/24/2022, 1:07 PM

## 2022-03-24 NOTE — Plan of Care (Signed)
  Problem: Health Behavior/Discharge Planning: Goal: Ability to manage health-related needs will improve Outcome: Progressing   Problem: Activity: Goal: Risk for activity intolerance will decrease Outcome: Progressing   Problem: Nutrition: Goal: Adequate nutrition will be maintained Outcome: Progressing   Problem: Pain Managment: Goal: General experience of comfort will improve Outcome: Progressing   Problem: Safety: Goal: Ability to remain free from injury will improve Outcome: Progressing

## 2022-03-24 NOTE — Care Management Important Message (Signed)
Important Message  Patient Details  Name: [name] [name] MRN: [id number] Date of Birth: January 02, 1946   Medicare Important Message Given:  Yes     Memory Argue 03/24/2022, 1:10 PM

## 2022-03-24 NOTE — Progress Notes (Signed)
[facility]                                                                               Rockie Vawter, is a 76 y.o. female, DOB - 1945/08/19, KXF:[id number] Admit date - 03/18/2022    Outpatient Primary MD for the patient is Hoyt Koch, MD  LOS - 6  days    Brief summary     [name] [name] is a 76 y.o. female with medical history significant for paroxysmal SVT, hypertension, type 2 diabetes, hearing loss, who presented to [facility] from the ED from SNF due to visual changes.  History is mainly obtained from EDP as the patient is a poor historian.  The patient has been having floaters in her vision for about weeks. In the ED, workup revealed UA positive for pyuria with concern for sepsis.  She was started on empiric IV antibiotics and [facility], hospitalist service, was asked to admit. CXR showing multifocal pneumonia.   Assessment & Plan    Assessment and Plan:  SIRS: Hypothermia resolved.  From multifocal pneumonia and UTI:  Blood cultures growing staph epidermidis in aerobic bottles, possibly contaminant. Blood cultures repeated and negative so far.  Urine cultures showing e coli, pan sensitive. Completed 5 days of IV antibiotics.  She is currently on RA.  SLP eval cleared her for regular diet, but she reports dysphagia to solids and liquids. Will get esophagogram for further evaluation.  Lactic acid wnl. RSV , covid19 PCR and influenza PCR is negative.    Paroxysmal SVT:  She is bradycardic. Was on lopressor, which is on hold for now.    Iron deficiency anemia/ anemia of chronic disease: Normocytic. Hemoglobin around 10.     Type 2 DM with  hyperglycemia:  CBG (last 3)  Recent Labs    03/23/22 2111 03/24/22 0731 03/24/22 1138  GLUCAP 164* 104* 125*    Resume SSI. No changes in meds.  Hemoglobin A1c is 5.8%.      Chronic anxiety and depression:  Resume zoloft.    Visual changes : MRI brain without contrast is negative for acute  stroke.   Loose bowel movements -  Since one year, . Intermittent , not associated with nausea, vomiting or abdominal pain.  Recommend outpatient follow up with gastroenterology.     Bilateral lower extremity edema /Lymphedema/ Venous insufficiency in the lower extremities.  Unna boots    Lower extremity edema with left ankle pain:  X rays of the ankle show severe osteoarthritis.  Venous duplex of the lower extremity is negative for DVT.  Treating her for possible gouty flare of her left ankle with colchicine.     Interventions: Refer to RD note for recommendations, Boost Plus, MVI  Estimated body mass index is 29.85 kg/m as calculated from the following:   Height as of this encounter: '5\' 11"'$  (1.803 m).   Weight as of this encounter: 97.1 kg.  Code Status: full code.  DVT Prophylaxis:  apixaban (ELIQUIS) tablet 2.5 mg Start: 03/19/22 1500 apixaban (ELIQUIS) tablet 2.5 mg   Level of Care: Level of care: Progressive Family Communication: none at bedside.   Disposition Plan:     Remains  inpatient appropriate:  IV antibiotics.   Procedures:  None.   Consultants:   None.   Antimicrobials:   Anti-infectives (From admission, onward)    Start     Dose/Rate Route Frequency Ordered Stop   03/19/22 2359  cefTRIAXone (ROCEPHIN) 2 g in sodium chloride 0.9 % 100 mL IVPB        2 g 200 mL/hr over 30 Minutes Intravenous Every 24 hours 03/19/22 0545 03/22/22 2149   03/19/22 1000  doxycycline (VIBRA-TABS) tablet 100 mg        100 mg Oral Every 12 hours 03/19/22 0545 03/22/22 2116   03/18/22 2300  cefTRIAXone (ROCEPHIN) 1 g in sodium chloride 0.9 % 100 mL IVPB        1 g 200 mL/hr over 30 Minutes Intravenous  Once 03/18/22 2259 03/19/22 0401   03/18/22 2300  doxycycline (VIBRA-TABS) tablet 100 mg        100 mg Oral  Once 03/18/22 2259 03/18/22 2358        Medications  Scheduled Meds:  apixaban  2.5 mg Oral BID   colchicine  0.6 mg Oral BID   ferrous sulfate  325 mg Oral  Q breakfast   fluticasone  1 spray Each Nare Daily   folic acid  1 mg Oral Daily   insulin aspart  0-5 Units Subcutaneous QHS   insulin aspart  0-9 Units Subcutaneous TID WC   lactose free nutrition  237 mL Oral BID BM   loratadine  10 mg Oral Daily   montelukast  10 mg Oral QHS   multivitamin with minerals  1 tablet Oral Daily   pantoprazole  40 mg Oral Daily   polyvinyl alcohol  1 drop Both Eyes BID   sertraline  25 mg Oral Daily   thiamine  100 mg Oral Daily   Continuous Infusions:   PRN Meds:.    Subjective:   [name] [name] was seen and examined today. Reports she has difficulty swallowing with both solids and liquids  Objective:   Vitals:   03/23/22 1240 03/23/22 2114 03/24/22 0419 03/24/22 1140  BP: 128/63 124/64 108/61 125/63  Pulse: 69 82 66 69  Resp: '19 16 20 20  '$ Temp: 98.3 F (36.8 C) 98.3 F (36.8 C) 97.8 F (36.6 C) 98.5 F (36.9 C)  TempSrc: Oral Oral Oral Oral  SpO2: 98% 96% 96% 100%  Weight:      Height:        Intake/Output Summary (Last 24 hours) at 03/24/2022 1238 Last data filed at 03/24/2022 0844 Gross per 24 hour  Intake 360 ml  Output 850 ml  Net -490 ml    Filed Weights   03/19/22 1347  Weight: 97.1 kg     Exam General exam: Appears calm and comfortable  Respiratory system: Clear to auscultation. Respiratory effort normal. Cardiovascular system: S1 & S2 heard, RRR. No JVD, murmurs,  Gastrointestinal system: Abdomen is nondistended, soft and nontender.  Central nervous system: Alert and oriented. No focal neurological deficits. Extremities: left ankle tenderness and swelling of the lower extremities.  Skin: No rashes, lesions or ulcers Psychiatry: Mood & affect appropriate.      Data Reviewed:  I have personally reviewed following labs and imaging studies   CBC Lab Results  Component Value Date   WBC 5.3 03/22/2022   RBC 4.15 03/22/2022   HGB 10.6 (L) 03/22/2022   HCT 36.0 03/22/2022   MCV 86.7 03/22/2022   MCH  25.5 (L) 03/22/2022   PLT 239  03/22/2022   MCHC 29.4 (L) 03/22/2022   RDW 17.1 (H) 03/22/2022   LYMPHSABS 1.9 03/22/2022   MONOABS 0.5 03/22/2022   EOSABS 0.1 03/22/2022   BASOSABS 0.0 44/04/270     Last metabolic panel Lab Results  Component Value Date   NA 140 03/22/2022   K 4.9 03/22/2022   CL 106 03/22/2022   CO2 25 03/22/2022   BUN 38 (H) 03/22/2022   CREATININE 1.01 (H) 03/22/2022   GLUCOSE 82 03/22/2022   GFRNONAA 58 (L) 03/22/2022   GFRAA 41 (L) 01/09/2019   CALCIUM 9.4 03/22/2022   PHOS 3.7 03/19/2022   PROT 6.4 (L) 02/16/2021   ALBUMIN 2.2 (L) 02/16/2021   BILITOT 0.6 02/16/2021   ALKPHOS 82 02/16/2021   AST 16 02/16/2021   ALT 17 02/16/2021   ANIONGAP 9 03/22/2022    CBG (last 3)  Recent Labs    03/23/22 2111 03/24/22 0731 03/24/22 1138  GLUCAP 164* 104* 125*       Coagulation Profile: No results for input(s): "INR", "PROTIME" in the last 168 hours.   Radiology Studies: VAS [place] LOWER EXTREMITY VENOUS (DVT)  Result Date: 03/24/2022  Lower Venous DVT Study Patient Name:  [name] [name]  Date of Exam:   03/24/2022 Medical Rec #: [id number]         Accession #:    [id number] Date of Birth: Nov 20, 1945          Patient Gender: F Patient Age:   38 years Exam Location:  [facility] Procedure:      VAS [place] LOWER EXTREMITY VENOUS (DVT) Referring Phys: Hosie Poisson --------------------------------------------------------------------------------  Indications: Swelling.  Risk Factors: None identified. Limitations: Body habitus, poor ultrasound/tissue interface and [employer]. Comparison Study: No prior studies. Performing Technologist: Oliver Hum RVT  Examination Guidelines: A complete evaluation includes B-mode imaging, spectral Doppler, color Doppler, and power Doppler as needed of all accessible portions of each vessel. Bilateral testing is considered an integral part of a complete examination. Limited examinations for reoccurring indications may be  performed as noted. The reflux portion of the exam is performed with the patient in reverse Trendelenburg.  +---------+---------------+---------+-----------+----------+-------------------+ RIGHT    CompressibilityPhasicitySpontaneityPropertiesThrombus Aging      +---------+---------------+---------+-----------+----------+-------------------+ CFV      Full           Yes      Yes                                      +---------+---------------+---------+-----------+----------+-------------------+ SFJ      Full                                                             +---------+---------------+---------+-----------+----------+-------------------+ FV Prox  Full                                                             +---------+---------------+---------+-----------+----------+-------------------+ FV Mid   Full                                                             +---------+---------------+---------+-----------+----------+-------------------+  FV Distal               Yes      Yes                                      +---------+---------------+---------+-----------+----------+-------------------+ PFV      Full                                                             +---------+---------------+---------+-----------+----------+-------------------+ POP      Full           Yes      Yes                                      +---------+---------------+---------+-----------+----------+-------------------+ PTV                                                   Not well visualized +---------+---------------+---------+-----------+----------+-------------------+ PERO                                                  Not well visualized +---------+---------------+---------+-----------+----------+-------------------+   +---------+---------------+---------+-----------+----------+-------------------+ LEFT      CompressibilityPhasicitySpontaneityPropertiesThrombus Aging      +---------+---------------+---------+-----------+----------+-------------------+ CFV      Full           Yes      Yes                                      +---------+---------------+---------+-----------+----------+-------------------+ SFJ      Full                                                             +---------+---------------+---------+-----------+----------+-------------------+ FV Prox  Full                                                             +---------+---------------+---------+-----------+----------+-------------------+ FV Mid   Full                                                             +---------+---------------+---------+-----------+----------+-------------------+ FV DistalFull                                                             +---------+---------------+---------+-----------+----------+-------------------+  PFV      Full                                                             +---------+---------------+---------+-----------+----------+-------------------+ POP      Full           Yes      Yes                                      +---------+---------------+---------+-----------+----------+-------------------+ PTV                                                   Not well visualized +---------+---------------+---------+-----------+----------+-------------------+ PERO                                                  Not well visualized +---------+---------------+---------+-----------+----------+-------------------+     Summary: RIGHT: - There is no evidence of deep vein thrombosis in the lower extremity. However, portions of this examination were limited- see technologist comments above.  - No cystic structure found in the popliteal fossa.  LEFT: - There is no evidence of deep vein thrombosis in the lower extremity. However, portions of this  examination were limited- see technologist comments above.  - No cystic structure found in the popliteal fossa.  *See table(s) above for measurements and observations.    Preliminary    DG Ankle Complete Left  Result Date: 03/23/2022 CLINICAL DATA:  Nontraumatic left ankle pain for 2 years. Difficulty moving joint. Unable to bear weight. EXAM: LEFT ANKLE COMPLETE - 3+ VIEW COMPARISON:  None Available. FINDINGS: There is overlying cast material which limits evaluation of fine bony detail. There is diffuse decreased bone mineralization. Moderate plantar calcaneal heel spur. Moderate chronic enthesophyte at the Achilles insertion on the posterior calcaneus. Moderate to severe talonavicular, navicular-cuneiform, and tarsometatarsal joint space narrowing and dorsal degenerative osteophytes on lateral view. Mild-to-moderate tibiotalar joint space narrowing and peripheral osteophytosis. No acute fracture is seen. No dislocation. IMPRESSION: 1. Moderate to severe talonavicular, navicular-cuneiform, and tarsometatarsal osteoarthritis. 2. Moderate plantar calcaneal heel spur. Electronically Signed   By: Yvonne Kendall M.D.   On: 03/23/2022 17:13       Hosie Poisson M.D. [facility] 03/24/2022, 12:38 PM  Available via Epic secure chat 7am-7pm After 7 pm, please refer to night coverage provider listed on amion.

## 2022-03-25 ENCOUNTER — Inpatient Hospital Stay (HOSPITAL_COMMUNITY): Payer: PPO

## 2022-03-25 DIAGNOSIS — K219 Gastro-esophageal reflux disease without esophagitis: Secondary | ICD-10-CM

## 2022-03-25 DIAGNOSIS — B9620 Unspecified Escherichia coli [E. coli] as the cause of diseases classified elsewhere: Secondary | ICD-10-CM

## 2022-03-25 DIAGNOSIS — E440 Moderate protein-calorie malnutrition: Secondary | ICD-10-CM

## 2022-03-25 DIAGNOSIS — I471 Supraventricular tachycardia, unspecified: Secondary | ICD-10-CM

## 2022-03-25 DIAGNOSIS — A419 Sepsis, unspecified organism: Secondary | ICD-10-CM | POA: Diagnosis not present

## 2022-03-25 DIAGNOSIS — E119 Type 2 diabetes mellitus without complications: Secondary | ICD-10-CM

## 2022-03-25 DIAGNOSIS — J189 Pneumonia, unspecified organism: Secondary | ICD-10-CM

## 2022-03-25 DIAGNOSIS — L8990 Pressure ulcer of unspecified site, unspecified stage: Secondary | ICD-10-CM | POA: Insufficient documentation

## 2022-03-25 DIAGNOSIS — Z8719 Personal history of other diseases of the digestive system: Secondary | ICD-10-CM

## 2022-03-25 DIAGNOSIS — H903 Sensorineural hearing loss, bilateral: Secondary | ICD-10-CM

## 2022-03-25 DIAGNOSIS — I890 Lymphedema, not elsewhere classified: Secondary | ICD-10-CM

## 2022-03-25 DIAGNOSIS — M4802 Spinal stenosis, cervical region: Secondary | ICD-10-CM

## 2022-03-25 DIAGNOSIS — M549 Dorsalgia, unspecified: Secondary | ICD-10-CM

## 2022-03-25 DIAGNOSIS — N390 Urinary tract infection, site not specified: Secondary | ICD-10-CM | POA: Diagnosis not present

## 2022-03-25 DIAGNOSIS — K224 Dyskinesia of esophagus: Secondary | ICD-10-CM

## 2022-03-25 HISTORY — DX: Pneumonia, unspecified organism: J18.9

## 2022-03-25 LAB — GLUCOSE, CAPILLARY
Glucose-Capillary: 93 mg/dL (ref 70–99)
Glucose-Capillary: 163 mg/dL — ABNORMAL HIGH (ref 70–99)

## 2022-03-25 MED ORDER — COLCHICINE 0.6 MG PO TABS: 0.6000 mg | ORAL_TABLET | Freq: Every day | ORAL | Status: DC

## 2022-03-25 MED ORDER — PANTOPRAZOLE SODIUM 40 MG PO TBEC: 40.0000 mg | DELAYED_RELEASE_TABLET | Freq: Every day | ORAL | Status: DC

## 2022-03-25 NOTE — Discharge Summary (Signed)
Physician Discharge Summary  [name] [name] ZRA:[id number] DOB: [birth year] DOA: 03/18/2022  PCP: Hoyt Koch, MD  Admit date: 03/18/2022 Discharge date: 03/25/2022 Admitted From: SNF Disposition: SNF Recommendations for Outpatient Follow-up:  Check CMP and CBC in 1 to 2 weeks As per patient precaution and esophageal compensation strategies Please follow up on the following pending results: None   Discharge Condition: Stable CODE STATUS: Full code   Hospital course 76 year old F with PMH of paroxysmal A-fib on Eliquis, NIDDM-2, ambulatory dysfunction, lymphedema, cervical spine stenosis, frozen right shoulder, no esophagitis, duodenal ulcer and HTN presenting with "seeing floaters" for about a week, and admitted for severe sepsis in the setting of bilateral pneumonia and UTI.  CXR showed multifocal pneumonia.  UA with pyuria.  Urine culture with pansensitive E. coli.  Blood cultures NGTD.  Patient completed 5 days course of ceftriaxone and doxycycline.   In regards to vision changes/seeing floaters, MRI brain without acute finding but degenerative changes at C3-C4 resulting in moderate to severe spinal canal stenosis with cord compression, similar to her imaging 2021.  Vision changes resolved.   Patient reported dysphagia during this hospitalization.  All esophagram  See individual problem list below for more. Esophagram based currently normal other than mild esophageal dysmotility. SLP consulted and recommended esophageal compensation strategies.  She has history of esophagitis and duodenal ulcer.  Continue Protonix daily.   Problems addressed during this hospitalization Principal Problem:   Severe sepsis ([place]) Active Problems:   GERD (gastroesophageal reflux disease)   DM type 2 (diabetes mellitus, type 2) (HCC)   Multilevel spine pain   Paroxysmal SVT (supraventricular tachycardia) (HCC)   Lymphedema   Sensorineural hearing loss (SNHL), bilateral   Malnutrition of  moderate degree   Pressure injury of skin   History of duodenal ulcer   History of esophagitis   Esophageal dysmotility   Cervical stenosis of spinal canal   Multifocal pneumonia   E. coli UTI   Severe sepsis due to multifocal pneumonia and E. coli UTI: Resolved. -Completed antibiotic course.   Paroxysmal SVT: Resolved.  Normal sinus rhythm. -Continue home metoprolol     Iron deficiency anemia/ anemia of chronic disease: H&H stable. -Check CBC in 1 to 2 weeks   Controlled NIDDM-2: A1c 5.9%. Recent Labs  Lab 03/24/22 1138 03/24/22 1721 03/24/22 2159 03/25/22 0750 03/25/22 1207  GLUCAP 125* 176* 112* 93 163*  -Continue home medications  Chronic anxiety and depression:  -Continue home meds.  Visual changes: Reported seeing floaters on presentation.  MRI brain without acute finding.  Resolved. -Consider outpatient follow-up with ophthalmology given history of diabetes.   Bilateral lower extremity edema /Lymphedema/ Venous insufficiency in the lower extremities.  -Continue Unna boot   History of cervical canal stenosis/cord compression: No bowel or bladder issue.  Reports anesthesia gait and BLE weakness for 2 years.  No change on MRI compared to her imaging from 2021. -Outpatient follow-up with neurosurgery -Continue PT/OT   Lower extremity edema with left ankle pain: X-ray shows severe OA.  Lower extremity venous Doppler negative for DVT. -Started on colchicine for possible gout  Moderate malnutrition Nutrition Problem: Moderate Malnutrition Etiology: chronic illness Signs/Symptoms: moderate muscle depletion, moderate fat depletion, percent weight loss (8% in 5 weeks) Percent weight loss: 8 % (in 5 weeks) Interventions: Refer to RD note for recommendations, Boost Plus, MVI  Pressure skin injury: POA Pressure Injury 03/19/22 Coccyx Stage 1 -  Intact skin with non-blanchable redness of a localized area usually over a bony prominence. (  Active)  03/19/22 1845   Location: Coccyx  Location Orientation:   Staging: Stage 1 -  Intact skin with non-blanchable redness of a localized area usually over a bony prominence.  Wound Description (Comments):   Present on Admission: Yes  Dressing Type Foam - Lift dressing to assess site every shift 03/24/22 1955    Vital signs Vitals:   03/24/22 0419 03/24/22 1140 03/24/22 2202 03/25/22 0457  BP: 108/61 125/63 122/72 120/62  Pulse: 66 69 81 71  Temp: 97.8 F (36.6 C) 98.5 F (36.9 C) 99.2 F (37.3 C) 98.7 F (37.1 C)  Resp: '20 20 20 20  '$ Height:      Weight:      SpO2: 96% 100% 100% 98%  TempSrc: Oral Oral Oral Oral  BMI (Calculated):         Discharge exam  GENERAL: No apparent distress.  Nontoxic. HEENT: MMM.  Vision and hearing grossly intact.  NECK: Supple.  No apparent JVD.  RESP:  No IWOB.  Fair aeration bilaterally. CVS:  RRR. Heart sounds normal.  ABD/GI/GU: BS+. Abd soft, NTND.  MSK/EXT: BLE weakness.  Unna boot on BLE.  Frozen right shoulder. SKIN: no apparent skin lesion or wound NEURO: Awake and alert. Oriented fairly.  No apparent focal neuro deficit other than right arm weakness from frozen right shoulder. PSYCH: Calm. Normal affect.   Discharge Instructions Discharge Instructions     Diet - low sodium heart healthy   Complete by: As directed    Diet Carb Modified   Complete by: As directed    Discharge wound care:   Complete by: As directed    Pressure Injury 03/19/22 Coccyx Stage 1 -  Intact skin with non-blanchable redness of a localized area usually over a bony prominence.   Increase activity slowly   Complete by: As directed       Allergies as of 03/25/2022       Reactions   Oxycodone Hcl Nausea And Vomiting   Penicillins Itching   Remote reaction, no anaphylaxis or severe cutaneous symptoms Tolerates Cefepime        Medication List     STOP taking these medications    omeprazole 20 MG capsule Commonly known as: PRILOSEC Replaced by: pantoprazole 40  MG tablet       TAKE these medications    acetaminophen 500 MG tablet Commonly known as: TYLENOL Take 1,000 mg by mouth every 8 (eight) hours as needed for mild pain or moderate pain.   albuterol 108 (90 Base) MCG/ACT inhaler Commonly known as: VENTOLIN HFA Inhale 2 puffs into the lungs every 6 (six) hours as needed for wheezing or shortness of breath.   apixaban 2.5 MG Tabs tablet Commonly known as: ELIQUIS Take by mouth 2 (two) times daily.   benzocaine 10 % mucosal gel Commonly known as: ORAJEL Use as directed 1 application  in the mouth or throat every 6 (six) hours as needed (gum pain/irritation).   colchicine 0.6 MG tablet Take 1 tablet (0.6 mg total) by mouth daily.   diclofenac Sodium 1 % Gel Commonly known as: VOLTAREN Apply 4 g topically 4 (four) times daily. Apply to lower back   feeding supplement (ENSURE COMPLETE) Liqd Take 237 mLs by mouth 2 (two) times daily between meals.   ferrous sulfate 325 (65 FE) MG tablet Take 1 tablet (325 mg total) by mouth daily with breakfast.   folic acid 1 MG tablet Commonly known as: FOLVITE Take 1 mg by mouth daily.  glipiZIDE 2.5 MG 24 hr tablet Commonly known as: GLUCOTROL XL Take 2.5 mg by mouth daily.   hydrocortisone 1 % lotion Apply 1 Application topically every 6 (six) hours as needed for itching (apply to right arm).   hydroxypropyl methylcellulose / hypromellose 2.5 % ophthalmic solution Commonly known as: ISOPTO TEARS / GONIOVISC Place 1 drop into both eyes 4 (four) times daily as needed for dry eyes.   hydrOXYzine 25 MG tablet Commonly known as: ATARAX Take 25 mg by mouth every 6 (six) hours as needed for itching.   loperamide 2 MG tablet Commonly known as: IMODIUM A-D Take 2 mg by mouth 2 (two) times daily.   loratadine 10 MG tablet Commonly known as: CLARITIN Take 10 mg by mouth daily.   magnesium hydroxide 400 MG/5ML suspension Commonly known as: MILK OF MAGNESIA Take 30 mLs by mouth daily  as needed for mild constipation.   methocarbamol 500 MG tablet Commonly known as: ROBAXIN Take 1,000 mg by mouth every 8 (eight) hours as needed for muscle spasms.   metoprolol tartrate 25 MG tablet Commonly known as: LOPRESSOR Take 1 tablet (25 mg total) by mouth 2 (two) times daily.   montelukast 10 MG tablet Commonly known as: SINGULAIR Take 1 tablet (10 mg total) by mouth at bedtime.   multivitamin with minerals Tabs tablet Take 1 tablet by mouth daily.   ondansetron 4 MG tablet Commonly known as: ZOFRAN Take 4 mg by mouth every 8 (eight) hours as needed for nausea or vomiting.   pantoprazole 40 MG tablet Commonly known as: PROTONIX Take 1 tablet (40 mg total) by mouth daily. Start taking on: March 26, 2022 Replaces: omeprazole 20 MG capsule   promethazine 25 MG suppository Commonly known as: Phenergan Place 1 suppository (25 mg total) rectally every 6 (six) hours as needed for nausea or vomiting.   sertraline 25 MG tablet Commonly known as: ZOLOFT Take 25 mg by mouth daily.   sodium chloride 0.65 % Soln nasal spray Commonly known as: OCEAN Place 1 spray into both nostrils every 2 (two) hours as needed for congestion (nasal dryness).   sucralfate 1 g tablet Commonly known as: CARAFATE Take 1 g by mouth 4 (four) times daily. Dissolve tablets   SYSTANE COMPLETE OP Apply 1 drop to eye 2 (two) times daily.   thiamine 100 MG tablet Commonly known as: VITAMIN B1 Take 1 tablet (100 mg total) by mouth daily.               Discharge Care Instructions  (From admission, onward)           Start     Ordered   03/25/22 0000  Discharge wound care:       Comments: Pressure Injury 03/19/22 Coccyx Stage 1 -  Intact skin with non-blanchable redness of a localized area usually over a bony prominence.   03/25/22 1258            Consultations: None  Procedures/Studies:   DG ESOPHAGUS W SINGLE CM (SOL OR THIN BA)  Result Date: 03/25/2022 CLINICAL  DATA:  Dysphagia solid foods. EXAM: ESOPHOGRAM/BARIUM SWALLOW TECHNIQUE: Single contrast examination was performed using  thin barium. FLUOROSCOPY: Radiation Exposure Index (as provided by the fluoroscopic device): 7.0 mGy COMPARISON:  Chest radiograph 12 1323 FINDINGS: Exam is limited due to patient immobility. Patient was imaged in semi-supine positioning. Normal swallowing function in the high cervical esophagus. No stricture or mass within the thoracic esophagus or distal esophagus. Contrast passed GE junction  easily. Mild to and fro motion of the barium bolus. Barium tablet could not be administered as patient was reclined. IMPRESSION: 1. Limited exam due to patient immobility. Patient imaged in semi reclined position. 2. No stenosis, stricture, or mass within the esophagus identified. Contrast easily passed into the stomach. 3. Mild esophageal dysmotility. Electronically Signed   By: Suzy Bouchard M.D.   On: 03/25/2022 09:33   VAS [place] LOWER EXTREMITY VENOUS (DVT)  Result Date: 03/24/2022  Lower Venous DVT Study Patient Name:  [name] [name]  Date of Exam:   03/24/2022 Medical Rec #: [id number]         Accession #:    [id number] Date of Birth: [birth year]          Patient Gender: F Patient Age:   43 years Exam Location:  [facility] Procedure:      VAS [place] LOWER EXTREMITY VENOUS (DVT) Referring Phys: Hosie Poisson --------------------------------------------------------------------------------  Indications: Swelling.  Risk Factors: None identified. Limitations: Body habitus, poor ultrasound/tissue interface and [employer]. Comparison Study: No prior studies. Performing Technologist: Oliver Hum RVT  Examination Guidelines: A complete evaluation includes B-mode imaging, spectral Doppler, color Doppler, and power Doppler as needed of all accessible portions of each vessel. Bilateral testing is considered an integral part of a complete examination. Limited examinations for reoccurring indications  may be performed as noted. The reflux portion of the exam is performed with the patient in reverse Trendelenburg.  +---------+---------------+---------+-----------+----------+-------------------+ RIGHT    CompressibilityPhasicitySpontaneityPropertiesThrombus Aging      +---------+---------------+---------+-----------+----------+-------------------+ CFV      Full           Yes      Yes                                      +---------+---------------+---------+-----------+----------+-------------------+ SFJ      Full                                                             +---------+---------------+---------+-----------+----------+-------------------+ FV Prox  Full                                                             +---------+---------------+---------+-----------+----------+-------------------+ FV Mid   Full                                                             +---------+---------------+---------+-----------+----------+-------------------+ FV Distal               Yes      Yes                                      +---------+---------------+---------+-----------+----------+-------------------+ PFV      Full                                                             +---------+---------------+---------+-----------+----------+-------------------+  POP      Full           Yes      Yes                                      +---------+---------------+---------+-----------+----------+-------------------+ PTV                                                   Not well visualized +---------+---------------+---------+-----------+----------+-------------------+ PERO                                                  Not well visualized +---------+---------------+---------+-----------+----------+-------------------+   +---------+---------------+---------+-----------+----------+-------------------+ LEFT      CompressibilityPhasicitySpontaneityPropertiesThrombus Aging      +---------+---------------+---------+-----------+----------+-------------------+ CFV      Full           Yes      Yes                                      +---------+---------------+---------+-----------+----------+-------------------+ SFJ      Full                                                             +---------+---------------+---------+-----------+----------+-------------------+ FV Prox  Full                                                             +---------+---------------+---------+-----------+----------+-------------------+ FV Mid   Full                                                             +---------+---------------+---------+-----------+----------+-------------------+ FV DistalFull                                                             +---------+---------------+---------+-----------+----------+-------------------+ PFV      Full                                                             +---------+---------------+---------+-----------+----------+-------------------+ POP      Full           Yes  Yes                                      +---------+---------------+---------+-----------+----------+-------------------+ PTV                                                   Not well visualized +---------+---------------+---------+-----------+----------+-------------------+ PERO                                                  Not well visualized +---------+---------------+---------+-----------+----------+-------------------+     Summary: RIGHT: - There is no evidence of deep vein thrombosis in the lower extremity. However, portions of this examination were limited- see technologist comments above.  - No cystic structure found in the popliteal fossa.  LEFT: - There is no evidence of deep vein thrombosis in the lower extremity. However, portions of this  examination were limited- see technologist comments above.  - No cystic structure found in the popliteal fossa.  *See table(s) above for measurements and observations. Electronically signed by Servando Snare MD on 03/24/2022 at 4:13:08 PM.    Final    DG Ankle Complete Left  Result Date: 03/23/2022 CLINICAL DATA:  Nontraumatic left ankle pain for 2 years. Difficulty moving joint. Unable to bear weight. EXAM: LEFT ANKLE COMPLETE - 3+ VIEW COMPARISON:  None Available. FINDINGS: There is overlying cast material which limits evaluation of fine bony detail. There is diffuse decreased bone mineralization. Moderate plantar calcaneal heel spur. Moderate chronic enthesophyte at the Achilles insertion on the posterior calcaneus. Moderate to severe talonavicular, navicular-cuneiform, and tarsometatarsal joint space narrowing and dorsal degenerative osteophytes on lateral view. Mild-to-moderate tibiotalar joint space narrowing and peripheral osteophytosis. No acute fracture is seen. No dislocation. IMPRESSION: 1. Moderate to severe talonavicular, navicular-cuneiform, and tarsometatarsal osteoarthritis. 2. Moderate plantar calcaneal heel spur. Electronically Signed   By: Yvonne Kendall M.D.   On: 03/23/2022 17:13   DG Chest Portable 1 View  Result Date: 03/18/2022 CLINICAL DATA:  Infection EXAM: PORTABLE CHEST 1 VIEW COMPARISON:  02/15/2017 FINDINGS: 2 frontal views of the chest demonstrate an unremarkable cardiac silhouette. There is patchy bibasilar airspace disease. No effusion or pneumothorax. No acute bony abnormalities. IMPRESSION: 1. Patchy bibasilar airspace disease consistent with multifocal bronchopneumonia. Aspiration could be considered in the appropriate setting. Electronically Signed   By: Randa Ngo M.D.   On: 03/18/2022 22:47   MR BRAIN WO CONTRAST  Result Date: 03/18/2022 CLINICAL DATA:  TIA, history of Wernicke's encephalopathy. Reported visual disturbances and vertigo. EXAM: MRI HEAD WITHOUT  CONTRAST TECHNIQUE: Multiplanar, multiecho pulse sequences of the brain and surrounding structures were obtained without intravenous contrast. COMPARISON:  Brain MRI 03/18/2020 FINDINGS: Brain: There is no acute intracranial hemorrhage, extra-axial fluid collection, or acute infarct. Parenchymal volume is normal. The ventricles are normal in size. Gray-white differentiation is preserved. There is a minimal for age burden of small-vessel ischemic change in the supratentorial white matter. There is no evidence of Wernicke's encephalopathy or other acute parenchymal signal abnormality. There is no mass lesion.  There is no mass effect or midline shift. Vascular: Normal flow voids. Skull and upper cervical spine: There is no suspicious marrow  signal abnormality. There is degenerative change at C3-C4 resulting in moderate to severe spinal canal stenosis with possible cord compression, similar to 2021. Sinuses/Orbits: There is mild mucosal thickening in the paranasal sinuses. The globes and orbits are unremarkable. Other: None. IMPRESSION: 1. Unremarkable for age appearance of the brain with no acute intracranial pathology. 2. Degenerative change at C3-C4 resulting in moderate to severe spinal canal stenosis with cord compression, similar to 2021. Electronically Signed   By: Valetta Mole M.D.   On: 03/18/2022 21:28       The results of significant diagnostics from this hospitalization (including imaging, microbiology, ancillary and laboratory) are listed below for reference.     Microbiology: Recent Results (from the past 240 hour(s))  Blood culture (routine x 2)     Status: Abnormal   Collection Time: 03/18/22 10:50 PM   Specimen: BLOOD LEFT HAND  Result Value Ref Range Status   Specimen Description   Final    BLOOD LEFT HAND Performed at [facility], [place] [address] [place], [place] [address]    Special Requests   Final    BOTTLES DRAWN AEROBIC AND ANAEROBIC Blood Culture results may not be  optimal due to an inadequate volume of blood received in culture bottles Performed at [facility] [address], [place], [place] [address]    Culture  Setup Time   Final    GRAM POSITIVE COCCI IN CLUSTERS AEROBIC BOTTLE ONLY CRITICAL RESULT CALLED TO, READ BACK BY AND VERIFIED WITH: PHARMD NICK [place] ON 03/19/22 @ 2110 BY DRT    Culture (A)  Final    STAPHYLOCOCCUS EPIDERMIDIS THE SIGNIFICANCE OF ISOLATING THIS ORGANISM FROM A SINGLE SET OF BLOOD CULTURES WHEN MULTIPLE SETS ARE DRAWN IS UNCERTAIN. PLEASE NOTIFY THE MICROBIOLOGY DEPARTMENT WITHIN ONE WEEK IF SPECIATION AND SENSITIVITIES ARE REQUIRED. Performed at [facility], [place] [address] [place], [place] [address]    Report Status 03/20/2022 FINAL  Final  Blood Culture ID Panel (Reflexed)     Status: Abnormal   Collection Time: 03/18/22 10:50 PM  Result Value Ref Range Status   Enterococcus faecalis NOT DETECTED NOT DETECTED Final   Enterococcus Faecium NOT DETECTED NOT DETECTED Final   Listeria monocytogenes NOT DETECTED NOT DETECTED Final   Staphylococcus species DETECTED (A) NOT DETECTED Final    Comment: CRITICAL RESULT CALLED TO, READ BACK BY AND VERIFIED WITH: PHARMD NICK GLOGOVAC ON 03/19/22 @ 2110 BY DRT    Staphylococcus aureus (BCID) NOT DETECTED NOT DETECTED Final   Staphylococcus epidermidis DETECTED (A) NOT DETECTED Final    Comment: CRITICAL RESULT CALLED TO, READ BACK BY AND VERIFIED WITH: PHARMD NICK GLOGOVAC ON 03/19/22 @ 2110 BY DRT    Staphylococcus lugdunensis NOT DETECTED NOT DETECTED Final   Streptococcus species NOT DETECTED NOT DETECTED Final   Streptococcus agalactiae NOT DETECTED NOT DETECTED Final   Streptococcus pneumoniae NOT DETECTED NOT DETECTED Final   Streptococcus pyogenes NOT DETECTED NOT DETECTED Final   A.calcoaceticus-baumannii NOT DETECTED NOT DETECTED Final   Bacteroides fragilis NOT DETECTED NOT DETECTED Final   Enterobacterales NOT DETECTED NOT DETECTED  Final   Enterobacter cloacae complex NOT DETECTED NOT DETECTED Final   Escherichia coli NOT DETECTED NOT DETECTED Final   Klebsiella aerogenes NOT DETECTED NOT DETECTED Final   Klebsiella oxytoca NOT DETECTED NOT DETECTED Final   Klebsiella pneumoniae NOT DETECTED NOT DETECTED Final   Proteus species NOT DETECTED NOT DETECTED Final   Salmonella species NOT DETECTED NOT DETECTED  Final   Serratia marcescens NOT DETECTED NOT DETECTED Final   Haemophilus influenzae NOT DETECTED NOT DETECTED Final   Neisseria meningitidis NOT DETECTED NOT DETECTED Final   Pseudomonas aeruginosa NOT DETECTED NOT DETECTED Final   Stenotrophomonas maltophilia NOT DETECTED NOT DETECTED Final   Candida albicans NOT DETECTED NOT DETECTED Final   Candida auris NOT DETECTED NOT DETECTED Final   Candida glabrata NOT DETECTED NOT DETECTED Final   Candida krusei NOT DETECTED NOT DETECTED Final   Candida parapsilosis NOT DETECTED NOT DETECTED Final   Candida tropicalis NOT DETECTED NOT DETECTED Final   Cryptococcus neoformans/gattii NOT DETECTED NOT DETECTED Final   Methicillin resistance mecA/C NOT DETECTED NOT DETECTED Final    Comment: Performed at [facility], [place] [address] [place],  [address]  Resp panel by RT-PCR (RSV, Flu A&B, Covid) Anterior Nasal Swab     Status: None   Collection Time: 03/18/22 11:08 PM   Specimen: Anterior Nasal Swab  Result Value Ref Range Status   SARS Coronavirus 2 by RT PCR NEGATIVE NEGATIVE Final    Comment: (NOTE) SARS-CoV-2 target nucleic acids are NOT DETECTED.  The SARS-CoV-2 RNA is generally detectable in upper respiratory specimens during the acute phase of infection. The lowest concentration of SARS-CoV-2 viral copies this assay can detect is 138 copies/mL. A negative result does not preclude SARS-Cov-2 infection and should not be used as the sole basis for treatment or other patient management decisions. A negative result may occur with  improper specimen  collection/handling, submission of specimen other than nasopharyngeal swab, presence of viral mutation(s) within the areas targeted by this assay, and inadequate number of viral copies(<138 copies/mL). A negative result must be combined with clinical observations, patient history, and epidemiological information. The expected result is Negative.  Fact Sheet for Patients:  EntrepreneurPulse.com.au  Fact Sheet for Healthcare Providers:  IncredibleEmployment.be  This test is no t yet approved or cleared by the [place] FDA and  has been authorized for detection and/or diagnosis of SARS-CoV-2 by FDA under an Emergency Use Authorization (EUA). This EUA will remain  in effect (meaning this test can be used) for the duration of the COVID-19 declaration under Section 564(b)(1) of the Act, 21 U.S.C.section 360bbb-3(b)(1), unless the authorization is terminated  or revoked sooner.       Influenza A by PCR NEGATIVE NEGATIVE Final   Influenza B by PCR NEGATIVE NEGATIVE Final    Comment: (NOTE) The Xpert Xpress SARS-CoV-2/FLU/RSV plus assay is intended as an aid in the diagnosis of influenza from Nasopharyngeal swab specimens and should not be used as a sole basis for treatment. Nasal washings and aspirates are unacceptable for Xpert Xpress SARS-CoV-2/FLU/RSV testing.  Fact Sheet for Patients: EntrepreneurPulse.com.au  Fact Sheet for Healthcare Providers: IncredibleEmployment.be  This test is not yet approved or cleared by the [place] FDA and has been authorized for detection and/or diagnosis of SARS-CoV-2 by FDA under an Emergency Use Authorization (EUA). This EUA will remain in effect (meaning this test can be used) for the duration of the COVID-19 declaration under Section 564(b)(1) of the Act, 21 U.S.C. section 360bbb-3(b)(1), unless the authorization is terminated or revoked.     Resp Syncytial  Virus by PCR NEGATIVE NEGATIVE Final    Comment: (NOTE) Fact Sheet for Patients: EntrepreneurPulse.com.au  Fact Sheet for Healthcare Providers: IncredibleEmployment.be  This test is not yet approved or cleared by the [place] FDA and has been authorized for detection and/or diagnosis of SARS-CoV-2 by FDA under  an Emergency Use Authorization (EUA). This EUA will remain in effect (meaning this test can be used) for the duration of the COVID-19 declaration under Section 564(b)(1) of the Act, 21 U.S.C. section 360bbb-3(b)(1), unless the authorization is terminated or revoked.  Performed at [facility], [place] [address], [place], [place] [address]   Blood culture (routine x 2)     Status: None   Collection Time: 03/18/22 11:35 PM   Specimen: BLOOD  Result Value Ref Range Status   Specimen Description   Final    BLOOD RIGHT ANTECUBITAL Performed at [facility] [address], [place], [place] [address]    Special Requests   Final    BOTTLES DRAWN AEROBIC AND ANAEROBIC Blood Culture adequate volume Performed at [place] [address] [place], [place] [address]    Culture   Final    NO GROWTH 5 DAYS Performed at [facility], [place] [address] [place], [place] [address]    Report Status 03/24/2022 FINAL  Final  Urine Culture     Status: Abnormal   Collection Time: 03/19/22  2:19 AM   Specimen: Urine, Clean Catch  Result Value Ref Range Status   Specimen Description   Final    URINE, CLEAN CATCH Performed at [facility], [place] [address] [place], [place] [address]    Special Requests   Final    NONE Performed at [facility], [place] [address] [place], [place] [address]    Culture >=100,000 COLONIES/mL ESCHERICHIA COLI (A)  Final   Report Status 03/21/2022 FINAL  Final   Organism ID, Bacteria ESCHERICHIA COLI (A)  Final       Susceptibility   Escherichia coli - MIC*    AMPICILLIN <=2 SENSITIVE Sensitive     CEFAZOLIN <=4 SENSITIVE Sensitive     CEFEPIME <=0.12 SENSITIVE Sensitive     CEFTRIAXONE <=0.25 SENSITIVE Sensitive     CIPROFLOXACIN <=0.25 SENSITIVE Sensitive     GENTAMICIN <=1 SENSITIVE Sensitive     IMIPENEM <=0.25 SENSITIVE Sensitive     NITROFURANTOIN 32 SENSITIVE Sensitive     TRIMETH/SULFA <=20 SENSITIVE Sensitive     AMPICILLIN/SULBACTAM <=2 SENSITIVE Sensitive     PIP/TAZO <=4 SENSITIVE Sensitive     * >=100,000 COLONIES/mL ESCHERICHIA COLI  Culture, blood (Routine X 2) w Reflex to ID Panel     Status: None (Preliminary result)   Collection Time: 03/21/22 10:47 AM   Specimen: Site Not Specified; Blood  Result Value Ref Range Status   Specimen Description   Final    SITE NOT SPECIFIED BOTTLES DRAWN AEROBIC AND ANAEROBIC Performed at [place] [address] [place], [place] [address]    Special Requests   Final    Blood Culture adequate volume Performed at [place] [address], [place], [place] [address]    Culture   Final    NO GROWTH 4 DAYS Performed at [facility], [place] [address], [place], [place] [address]    Report Status PENDING  Incomplete  Culture, blood (Routine X 2) w Reflex to ID Panel     Status: None (Preliminary result)   Collection Time: 03/21/22 10:47 AM   Specimen: Site Not Specified; Blood  Result Value Ref Range Status   Specimen Description   Final    SITE NOT SPECIFIED BOTTLES DRAWN AEROBIC AND ANAEROBIC Performed at [facility] [address] [place], [place]  [address]    Special Requests   Final    Blood Culture adequate volume Performed at [place] [address] [place] [address] [place], [place] [address]    Culture   Final    NO GROWTH 4 DAYS Performed at [facility], [place] [address] [place], [place] [address]    Report Status PENDING  Incomplete      Labs:  CBC: Recent Labs  Lab 03/18/22 1930 03/19/22 8756 03/20/22 0436 03/21/22 0456 03/22/22 0436  WBC 4.9 3.5* 3.7* 5.1 5.3  NEUTROABS 2.9  --  1.8 2.8 2.7  HGB 10.4* 9.9* 10.5* 10.3* 10.6*  HCT 34.9* 32.0* 34.8* 33.7* 36.0  MCV 86.6 84.4 85.5 85.3 86.7  PLT 173 200 214 237 239   BMP &GFR Recent Labs  Lab 03/18/22 1930 03/19/22 0613 03/20/22 0436 03/21/22 0456 03/22/22 0436  NA 141 143 143 140 140  K 5.1 4.5 4.9 4.8 4.9  CL 110 110 110 107 106  CO2 21* '27 24 25 25  '$ GLUCOSE 83 75 91 74 82  BUN 27* 27* 30* 34* 38*  CREATININE 0.89 0.91 0.93 0.95 1.01*  CALCIUM 9.3 9.3 9.3 9.5 9.4  MG  --  1.9  --   --   --   PHOS  --  3.7  --   --   --    Estimated Creatinine Clearance: 61.8 mL/min (A) (by C-G formula based on SCr of 1.01 mg/dL (H)). Liver & Pancreas: No results for input(s): "AST", "ALT", "ALKPHOS", "BILITOT", "PROT", "ALBUMIN" in the last 168 hours. No results for input(s): "LIPASE", "AMYLASE" in the last 168 hours. No results for input(s): "AMMONIA" in the last 168 hours. Diabetic: No results for input(s): "HGBA1C" in the last 72 hours. Recent Labs  Lab 03/24/22 1138 03/24/22 1721 03/24/22 2159 03/25/22 0750 03/25/22 1207  GLUCAP 125* 176* 112* 93 163*   Cardiac Enzymes: No results for input(s): "CKTOTAL", "CKMB", "CKMBINDEX", "TROPONINI" in the last 168 hours. No results for input(s): "PROBNP" in the last 8760 hours. Coagulation Profile: No results for input(s): "INR", "PROTIME" in the last 168 hours. Thyroid Function Tests: No results for input(s): "TSH", "T4TOTAL", "FREET4", "T3FREE", "THYROIDAB" in the last 72 hours. Lipid Profile: No results for input(s): "CHOL", "HDL", "LDLCALC", "TRIG", "CHOLHDL", "LDLDIRECT" in the last 72 hours. Anemia Panel: No results for input(s): "VITAMINB12", "FOLATE", "FERRITIN", "TIBC", "IRON", "RETICCTPCT" in the last 72 hours. Urine analysis:    Component Value Date/Time   COLORURINE YELLOW 03/18/2022 2225    APPEARANCEUR HAZY (A) 03/18/2022 2225   LABSPEC 1.016 03/18/2022 2225   PHURINE 6.0 03/18/2022 2225   GLUCOSEU NEGATIVE 03/18/2022 2225   GLUCOSEU NEGATIVE 02/02/2020 1220   HGBUR NEGATIVE 03/18/2022 2225   BILIRUBINUR NEGATIVE 03/18/2022 2225   BILIRUBINUR Neg 10/22/2017 0950   KETONESUR NEGATIVE 03/18/2022 2225   PROTEINUR NEGATIVE 03/18/2022 2225   UROBILINOGEN 0.2 02/02/2020 1220   NITRITE POSITIVE (A) 03/18/2022 2225   LEUKOCYTESUR NEGATIVE 03/18/2022 2225   Sepsis Labs: Invalid input(s): "PROCALCITONIN", "LACTICIDVEN"   SIGNED:  Mercy Riding, MD  [facility] 03/25/2022, 1:06 PM

## 2022-03-25 NOTE — Plan of Care (Signed)
  Problem: Metabolic: Goal: Ability to maintain appropriate glucose levels will improve Outcome: Progressing   Problem: Nutritional: Goal: Maintenance of adequate nutrition will improve Outcome: Progressing   Problem: Skin Integrity: Goal: Risk for impaired skin integrity will decrease Outcome: Progressing   Problem: Activity: Goal: Risk for activity intolerance will decrease Outcome: Progressing   Problem: Elimination: Goal: Will not experience complications related to bowel motility Outcome: Progressing   Problem: Pain Managment: Goal: General experience of comfort will improve Outcome: Progressing   Problem: Safety: Goal: Ability to remain free from injury will improve Outcome: Progressing   Problem: Skin Integrity: Goal: Risk for impaired skin integrity will decrease Outcome: Progressing

## 2022-03-25 NOTE — TOC Transition Note (Signed)
Transition of Care [facility]) - CM/SW Discharge Note   Patient Details  Name: [name] [name] MRN: [id number] Date of Birth: June 06, 1945  [facility] CM/SW Contact:  Illene Regulus, LCSW Phone Number: 03/25/2022, 1:26 PM   Clinical Narrative:     CSW spoke with pt about her discharged, back to [facility]. Pt expressed her concerns about the facility's SW. CSW explained the only thing that can be done is contacting the facility and letting them know about her concerns. CSW spoke with the facility about pt's concerns and discharge.CSW will arrange transport. No additional TOC needs.     Barriers to Discharge: Continued Medical Work up   Patient Goals and CMS Choice Patient states their goals for this hospitalization and ongoing recovery are:: return to nursing facility        Discharge Placement                       Discharge Plan and Services                                     Social Determinants of Health (SDOH) Interventions Housing Interventions: Intervention Not Indicated   Readmission Risk Interventions     No data to display

## 2022-03-25 NOTE — Progress Notes (Signed)
Order for swallow eval received, pt seen one week ago and SLP suspected pt's known esophageal dysphagia as primary source. At tha time, provided pt with esophageal compensation strategies.  Note results of her esophagram showing dysmotility- Secure chatted Dr Cyndia Skeeters who approved to dc order.  Thanks.    Kathleen Lime, MS [facility] SLP Acute Rehab Services Office [phone] Pager [phone]

## 2022-03-26 LAB — CULTURE, BLOOD (ROUTINE X 2)
Culture: NO GROWTH
Culture: NO GROWTH
Special Requests: ADEQUATE
Special Requests: ADEQUATE

## 2022-03-27 DIAGNOSIS — I10 Essential (primary) hypertension: Secondary | ICD-10-CM | POA: Diagnosis not present

## 2022-03-27 DIAGNOSIS — E119 Type 2 diabetes mellitus without complications: Secondary | ICD-10-CM | POA: Diagnosis not present

## 2022-04-01 ENCOUNTER — Telehealth: Payer: Self-pay

## 2022-04-01 DIAGNOSIS — F32A Depression, unspecified: Secondary | ICD-10-CM | POA: Diagnosis not present

## 2022-04-01 DIAGNOSIS — F331 Major depressive disorder, recurrent, moderate: Secondary | ICD-10-CM | POA: Diagnosis not present

## 2022-04-01 NOTE — Telephone Encounter (Signed)
Transition Care Management Follow-up Telephone Call Date of discharge and from where: 03/18/2022 [facility]  How have you been since you were released from the hospital? Pt states that she has been ok since she has left. Pt states that she had no one reach out to her and having troubles walking. Any questions or concerns? Yes  Items Reviewed: Did the pt receive and understand the discharge instructions provided? No  Medications obtained and verified? No  Other? No  Any new allergies since your discharge? No  Dietary orders reviewed? No Do you have support at home? No   Home Care and Equipment/Supplies: Were home health services ordered? no If so, what is the name of the agency? N/a  Has the agency set up a time to come to the patient's home? no Were any new equipment or medical supplies ordered?  No What is the name of the medical supply agency? N/a Were you able to get the supplies/equipment? no Do you have any questions related to the use of the equipment or supplies? No  Functional Questionnaire: (I = Independent and D = Dependent) ADLs: I  Bathing/Dressing- D/ Cannot walk patient would need help  Meal Prep- I  Eating- I  Maintaining continence- I  Transferring/Ambulation- I  Managing Meds- I  Follow up appointments reviewed:  PCP Hospital f/u appt confirmed? Yes  Scheduled to see Dr [name] on 04/14/2021  @ [facility] f/u appt confirmed? No  Scheduled to see n/a on n/a @ n/a. Are transportation arrangements needed? Yes  If their condition worsens, is the pt aware to call PCP or go to the Emergency Dept.? Yes Was the patient provided with contact information for the PCP's office or ED? Yes Was to pt encouraged to call back with questions or concerns? Yes

## 2022-04-07 DIAGNOSIS — F423 Hoarding disorder: Secondary | ICD-10-CM | POA: Diagnosis not present

## 2022-04-07 DIAGNOSIS — F331 Major depressive disorder, recurrent, moderate: Secondary | ICD-10-CM | POA: Diagnosis not present

## 2022-04-07 DIAGNOSIS — I69815 Cognitive social or emotional deficit following other cerebrovascular disease: Secondary | ICD-10-CM | POA: Diagnosis not present

## 2022-04-07 DIAGNOSIS — F01A3 Vascular dementia, mild, with mood disturbance: Secondary | ICD-10-CM | POA: Diagnosis not present

## 2022-04-09 DIAGNOSIS — U071 COVID-19: Secondary | ICD-10-CM | POA: Diagnosis not present

## 2022-04-10 DIAGNOSIS — E649 Sequelae of unspecified nutritional deficiency: Secondary | ICD-10-CM | POA: Diagnosis not present

## 2022-04-10 DIAGNOSIS — I10 Essential (primary) hypertension: Secondary | ICD-10-CM | POA: Diagnosis not present

## 2022-04-10 DIAGNOSIS — R0602 Shortness of breath: Secondary | ICD-10-CM | POA: Diagnosis not present

## 2022-04-10 DIAGNOSIS — E119 Type 2 diabetes mellitus without complications: Secondary | ICD-10-CM | POA: Diagnosis not present

## 2022-04-10 DIAGNOSIS — E559 Vitamin D deficiency, unspecified: Secondary | ICD-10-CM | POA: Diagnosis not present

## 2022-04-11 DIAGNOSIS — F331 Major depressive disorder, recurrent, moderate: Secondary | ICD-10-CM | POA: Diagnosis not present

## 2022-04-14 ENCOUNTER — Ambulatory Visit: Payer: PPO | Admitting: Internal Medicine

## 2022-04-15 DIAGNOSIS — F32A Depression, unspecified: Secondary | ICD-10-CM | POA: Diagnosis not present

## 2022-04-16 NOTE — Telephone Encounter (Signed)
Patient called back and said that Page B of the transportation form wasn't received, They need that. Call back for patient is [phone]

## 2022-04-16 NOTE — Telephone Encounter (Signed)
Left patient a voicemail letting her know that she will need to contact transportation and ask them to refax [place] the for B to have the provider fill this out

## 2022-04-17 DIAGNOSIS — F331 Major depressive disorder, recurrent, moderate: Secondary | ICD-10-CM | POA: Diagnosis not present

## 2022-04-17 NOTE — Telephone Encounter (Signed)
Spke with the pt and was able to inform her that we are needing page B of her form faxed to [place] as we did not get it with the other fax. Pt states she will call and let transportation know to please fax page B.

## 2022-04-21 ENCOUNTER — Ambulatory Visit (INDEPENDENT_AMBULATORY_CARE_PROVIDER_SITE_OTHER): Payer: PPO | Admitting: Internal Medicine

## 2022-04-21 ENCOUNTER — Encounter: Payer: Self-pay | Admitting: Internal Medicine

## 2022-04-21 VITALS — BP 112/80 | HR 104

## 2022-04-21 DIAGNOSIS — E440 Moderate protein-calorie malnutrition: Secondary | ICD-10-CM

## 2022-04-21 DIAGNOSIS — E1169 Type 2 diabetes mellitus with other specified complication: Secondary | ICD-10-CM | POA: Diagnosis not present

## 2022-04-21 DIAGNOSIS — J189 Pneumonia, unspecified organism: Secondary | ICD-10-CM

## 2022-04-21 DIAGNOSIS — N183 Chronic kidney disease, stage 3 unspecified: Secondary | ICD-10-CM

## 2022-04-21 DIAGNOSIS — I890 Lymphedema, not elsewhere classified: Secondary | ICD-10-CM

## 2022-04-21 NOTE — Assessment & Plan Note (Signed)
Foot exam is done. I suspect she is getting most of her care at the SNF and will recommend she return to SNF for most of care as this is more convenient for her. If she plans to leave SNF I am happy to resume her care.

## 2022-04-21 NOTE — Assessment & Plan Note (Signed)
She is better now and denies any aspiration episodes.

## 2022-04-21 NOTE — Assessment & Plan Note (Signed)
Labs through SNF and would recommend they continue her care with labs routinely.

## 2022-04-21 NOTE — Assessment & Plan Note (Signed)
Overall stable.   

## 2022-04-21 NOTE — Patient Instructions (Signed)
We will get the part b sent in for the transportation.

## 2022-04-21 NOTE — Assessment & Plan Note (Signed)
Suspect based on labs from hospital. She is eating okay now and has limited activity.

## 2022-04-21 NOTE — Progress Notes (Signed)
   Subjective:   Patient ID: [name] [name], female    DOB: 17-Jun-1945, 77 y.o.   MRN: [id number]  HPI The patient is a 77 YO female coming in for hospital follow up and last seen 2022. Lives at SNF since at least 2021 sometime (she is not sure exactly when) and mostly uses SNF doctor for care. She is upset as she has papers of a guardianship hearing with the courts coming up soon. Has questions about what this means.  Review of Systems  Constitutional:  Positive for activity change, appetite change and unexpected weight change.  HENT: Negative.    Eyes: Negative.   Respiratory:  Negative for cough, chest tightness and shortness of breath.   Cardiovascular:  Positive for leg swelling. Negative for chest pain and palpitations.  Gastrointestinal:  Negative for abdominal distention, abdominal pain, constipation, diarrhea, nausea and vomiting.  Musculoskeletal:  Positive for arthralgias and myalgias.  Skin: Negative.   Neurological:  Positive for weakness.  Psychiatric/Behavioral: Negative.      Objective:  Physical Exam Constitutional:      Appearance: She is well-developed.  HENT:     Head: Normocephalic and atraumatic.  Cardiovascular:     Rate and Rhythm: Normal rate and regular rhythm.  Pulmonary:     Effort: Pulmonary effort is normal. No respiratory distress.     Breath sounds: Normal breath sounds. No wheezing or rales.  Abdominal:     General: Bowel sounds are normal. There is no distension.     Palpations: Abdomen is soft.     Tenderness: There is no abdominal tenderness. There is no rebound.  Musculoskeletal:     Cervical back: Normal range of motion.     Right lower leg: Edema present.     Left lower leg: Edema present.  Skin:    General: Skin is warm and dry.     Comments: Foot exam done  Neurological:     Mental Status: She is alert and oriented to person, place, and time.     Coordination: Coordination abnormal.     Comments: Wheelchair for ambulation, can  transfer with sliding board per patient     Vitals:   04/21/22 0817  BP: 112/80  Pulse: (!) 104  SpO2: 98%    Assessment & Plan:  Visit time 25 minutes in face to face communication with patient and coordination of care, additional 15 minutes spent in record review, coordination or care, ordering tests, communicating/referring to other healthcare professionals, documenting in medical records all on the same day of the visit for total time 40 minutes spent on the visit.

## 2022-04-22 DIAGNOSIS — F331 Major depressive disorder, recurrent, moderate: Secondary | ICD-10-CM | POA: Diagnosis not present

## 2022-04-28 DIAGNOSIS — E119 Type 2 diabetes mellitus without complications: Secondary | ICD-10-CM | POA: Diagnosis not present

## 2022-04-28 DIAGNOSIS — N189 Chronic kidney disease, unspecified: Secondary | ICD-10-CM | POA: Diagnosis not present

## 2022-04-29 DIAGNOSIS — I10 Essential (primary) hypertension: Secondary | ICD-10-CM | POA: Diagnosis not present

## 2022-04-29 DIAGNOSIS — D649 Anemia, unspecified: Secondary | ICD-10-CM | POA: Diagnosis not present

## 2022-04-29 DIAGNOSIS — N1831 Chronic kidney disease, stage 3a: Secondary | ICD-10-CM | POA: Diagnosis not present

## 2022-04-29 DIAGNOSIS — I82409 Acute embolism and thrombosis of unspecified deep veins of unspecified lower extremity: Secondary | ICD-10-CM | POA: Diagnosis not present

## 2022-04-29 DIAGNOSIS — R300 Dysuria: Secondary | ICD-10-CM | POA: Diagnosis not present

## 2022-04-29 DIAGNOSIS — M109 Gout, unspecified: Secondary | ICD-10-CM | POA: Diagnosis not present

## 2022-04-29 DIAGNOSIS — E119 Type 2 diabetes mellitus without complications: Secondary | ICD-10-CM | POA: Diagnosis not present

## 2022-04-30 DIAGNOSIS — I10 Essential (primary) hypertension: Secondary | ICD-10-CM | POA: Diagnosis not present

## 2022-05-01 DIAGNOSIS — N390 Urinary tract infection, site not specified: Secondary | ICD-10-CM | POA: Diagnosis not present

## 2022-05-04 DIAGNOSIS — F331 Major depressive disorder, recurrent, moderate: Secondary | ICD-10-CM | POA: Diagnosis not present

## 2022-05-05 DIAGNOSIS — E119 Type 2 diabetes mellitus without complications: Secondary | ICD-10-CM | POA: Diagnosis not present

## 2022-05-05 DIAGNOSIS — N390 Urinary tract infection, site not specified: Secondary | ICD-10-CM | POA: Diagnosis not present

## 2022-05-05 DIAGNOSIS — E559 Vitamin D deficiency, unspecified: Secondary | ICD-10-CM | POA: Diagnosis not present

## 2022-05-05 DIAGNOSIS — E649 Sequelae of unspecified nutritional deficiency: Secondary | ICD-10-CM | POA: Diagnosis not present

## 2022-05-05 DIAGNOSIS — Z79899 Other long term (current) drug therapy: Secondary | ICD-10-CM | POA: Diagnosis not present

## 2022-05-05 DIAGNOSIS — I10 Essential (primary) hypertension: Secondary | ICD-10-CM | POA: Diagnosis not present

## 2022-05-06 DIAGNOSIS — R300 Dysuria: Secondary | ICD-10-CM | POA: Diagnosis not present

## 2022-05-06 DIAGNOSIS — Z7189 Other specified counseling: Secondary | ICD-10-CM | POA: Diagnosis not present

## 2022-05-11 DIAGNOSIS — F411 Generalized anxiety disorder: Secondary | ICD-10-CM | POA: Diagnosis not present

## 2022-05-11 DIAGNOSIS — F331 Major depressive disorder, recurrent, moderate: Secondary | ICD-10-CM | POA: Diagnosis not present

## 2022-05-12 DIAGNOSIS — F423 Hoarding disorder: Secondary | ICD-10-CM | POA: Diagnosis not present

## 2022-05-12 DIAGNOSIS — F01A3 Vascular dementia, mild, with mood disturbance: Secondary | ICD-10-CM | POA: Diagnosis not present

## 2022-05-12 DIAGNOSIS — I69815 Cognitive social or emotional deficit following other cerebrovascular disease: Secondary | ICD-10-CM | POA: Diagnosis not present

## 2022-05-12 DIAGNOSIS — F331 Major depressive disorder, recurrent, moderate: Secondary | ICD-10-CM | POA: Diagnosis not present

## 2022-05-21 DIAGNOSIS — E119 Type 2 diabetes mellitus without complications: Secondary | ICD-10-CM | POA: Diagnosis not present

## 2022-05-21 DIAGNOSIS — I890 Lymphedema, not elsewhere classified: Secondary | ICD-10-CM | POA: Diagnosis not present

## 2022-05-21 DIAGNOSIS — N1831 Chronic kidney disease, stage 3a: Secondary | ICD-10-CM | POA: Diagnosis not present

## 2022-05-21 DIAGNOSIS — R5381 Other malaise: Secondary | ICD-10-CM | POA: Diagnosis not present

## 2022-05-21 DIAGNOSIS — M1990 Unspecified osteoarthritis, unspecified site: Secondary | ICD-10-CM | POA: Diagnosis not present

## 2022-05-21 DIAGNOSIS — H1045 Other chronic allergic conjunctivitis: Secondary | ICD-10-CM | POA: Diagnosis not present

## 2022-05-21 DIAGNOSIS — H353131 Nonexudative age-related macular degeneration, bilateral, early dry stage: Secondary | ICD-10-CM | POA: Diagnosis not present

## 2022-05-21 DIAGNOSIS — I10 Essential (primary) hypertension: Secondary | ICD-10-CM | POA: Diagnosis not present

## 2022-05-21 DIAGNOSIS — H25813 Combined forms of age-related cataract, bilateral: Secondary | ICD-10-CM | POA: Diagnosis not present

## 2022-05-21 DIAGNOSIS — H11823 Conjunctivochalasis, bilateral: Secondary | ICD-10-CM | POA: Diagnosis not present

## 2022-06-01 DIAGNOSIS — E119 Type 2 diabetes mellitus without complications: Secondary | ICD-10-CM | POA: Diagnosis not present

## 2022-06-01 DIAGNOSIS — I10 Essential (primary) hypertension: Secondary | ICD-10-CM | POA: Diagnosis not present

## 2022-06-09 DIAGNOSIS — M79641 Pain in right hand: Secondary | ICD-10-CM | POA: Diagnosis not present

## 2022-06-09 DIAGNOSIS — H6120 Impacted cerumen, unspecified ear: Secondary | ICD-10-CM | POA: Diagnosis not present

## 2022-06-10 DIAGNOSIS — M19041 Primary osteoarthritis, right hand: Secondary | ICD-10-CM | POA: Diagnosis not present

## 2022-06-10 DIAGNOSIS — M79641 Pain in right hand: Secondary | ICD-10-CM | POA: Diagnosis not present

## 2022-06-16 DIAGNOSIS — B351 Tinea unguium: Secondary | ICD-10-CM | POA: Diagnosis not present

## 2022-06-16 DIAGNOSIS — E1159 Type 2 diabetes mellitus with other circulatory complications: Secondary | ICD-10-CM | POA: Diagnosis not present

## 2022-06-19 DIAGNOSIS — M6281 Muscle weakness (generalized): Secondary | ICD-10-CM | POA: Diagnosis not present

## 2022-06-19 DIAGNOSIS — R41841 Cognitive communication deficit: Secondary | ICD-10-CM | POA: Diagnosis not present

## 2022-06-19 DIAGNOSIS — E440 Moderate protein-calorie malnutrition: Secondary | ICD-10-CM | POA: Diagnosis not present

## 2022-06-24 DIAGNOSIS — D649 Anemia, unspecified: Secondary | ICD-10-CM | POA: Diagnosis not present

## 2022-06-24 DIAGNOSIS — R251 Tremor, unspecified: Secondary | ICD-10-CM | POA: Diagnosis not present

## 2022-06-24 DIAGNOSIS — N1831 Chronic kidney disease, stage 3a: Secondary | ICD-10-CM | POA: Diagnosis not present

## 2022-06-24 DIAGNOSIS — I82409 Acute embolism and thrombosis of unspecified deep veins of unspecified lower extremity: Secondary | ICD-10-CM | POA: Diagnosis not present

## 2022-06-24 DIAGNOSIS — E119 Type 2 diabetes mellitus without complications: Secondary | ICD-10-CM | POA: Diagnosis not present

## 2022-06-25 DIAGNOSIS — F331 Major depressive disorder, recurrent, moderate: Secondary | ICD-10-CM | POA: Diagnosis not present

## 2022-06-26 ENCOUNTER — Ambulatory Visit (INDEPENDENT_AMBULATORY_CARE_PROVIDER_SITE_OTHER): Payer: PPO | Admitting: Internal Medicine

## 2022-06-26 ENCOUNTER — Encounter: Payer: Self-pay | Admitting: Internal Medicine

## 2022-06-26 VITALS — BP 160/100 | HR 71 | Temp 97.5°F | Ht 71.0 in

## 2022-06-26 DIAGNOSIS — R531 Weakness: Secondary | ICD-10-CM | POA: Diagnosis not present

## 2022-06-26 DIAGNOSIS — E1169 Type 2 diabetes mellitus with other specified complication: Secondary | ICD-10-CM

## 2022-06-26 DIAGNOSIS — I10 Essential (primary) hypertension: Secondary | ICD-10-CM

## 2022-06-26 DIAGNOSIS — N1831 Chronic kidney disease, stage 3a: Secondary | ICD-10-CM | POA: Diagnosis not present

## 2022-06-26 LAB — COMPREHENSIVE METABOLIC PANEL
ALT: 15 U/L (ref 0–35)
AST: 19 U/L (ref 0–37)
Albumin: 4.1 g/dL (ref 3.5–5.2)
Alkaline Phosphatase: 99 U/L (ref 39–117)
BUN: 19 mg/dL (ref 6–23)
CO2: 29 mEq/L (ref 19–32)
Calcium: 9.9 mg/dL (ref 8.4–10.5)
Chloride: 105 mEq/L (ref 96–112)
Creatinine, Ser: 0.93 mg/dL (ref 0.40–1.20)
GFR: 60.04 mL/min (ref >60.00)
Glucose, Bld: 65 mg/dL — ABNORMAL LOW (ref 70–99)
Potassium: 4 mEq/L (ref 3.5–5.1)
Sodium: 144 mEq/L (ref 135–145)
Total Bilirubin: 0.4 mg/dL (ref 0.2–1.2)
Total Protein: 8.1 g/dL (ref 6.0–8.3)

## 2022-06-26 LAB — LIPID PANEL
Cholesterol: 186 mg/dL (ref 0–200)
HDL: 84.3 mg/dL (ref >39.00)
LDL Cholesterol: 86 mg/dL (ref 0–99)
NonHDL: 101.93
Total CHOL/HDL Ratio: 2
Triglycerides: 82 mg/dL (ref 0.0–149.0)
VLDL: 16.4 mg/dL (ref 0.0–40.0)

## 2022-06-26 LAB — CBC
HCT: 36.4 % (ref 36.0–46.0)
Hemoglobin: 11.7 g/dL — ABNORMAL LOW (ref 12.0–15.0)
MCHC: 32.1 g/dL (ref 30.0–36.0)
MCV: 81.9 fl (ref 78.0–100.0)
Platelets: 189 10*3/uL (ref 150.0–400.0)
RBC: 4.44 Mil/uL (ref 3.87–5.11)
RDW: 19.8 % — ABNORMAL HIGH (ref 11.5–15.5)
WBC: 4.6 10*3/uL (ref 4.0–10.5)

## 2022-06-26 LAB — HEMOGLOBIN A1C: Hgb A1c MFr Bld: 5.8 % (ref 4.6–6.5)

## 2022-06-26 NOTE — Assessment & Plan Note (Signed)
She is asking today about ability to walk in the future and we discussed she has had massive muscle loss in the several years she has not been ambulating. It would require serious PT attempt to try to regain this and I am not optimistic about this. Given her recent visit she had guardianship papers with some mental health diagnoses I am unsure of recent history.

## 2022-06-26 NOTE — Assessment & Plan Note (Addendum)
BP is high here and unclear what medications she is taking and if she has received them today or not. Only BP medication on our list is metoprolol 25 mg BID. She is unsure about medications.

## 2022-06-26 NOTE — Progress Notes (Signed)
   Subjective:   Patient ID: [name] [name], female    DOB: 06/30/1945, 77 y.o.   MRN: [id number]  HPI The patient is a 77 YO female coming from SNF for concerns. Worried about her care being appropriate. Concerned about diagnoses on her admit sheet from SNF from hospital.   Review of Systems  Constitutional:  Positive for activity change. Negative for appetite change, chills, fatigue, fever and unexpected weight change.  Eyes:  Positive for visual disturbance.  Respiratory: Negative.    Cardiovascular:  Positive for leg swelling.  Gastrointestinal: Negative.   Musculoskeletal:  Positive for arthralgias, back pain and myalgias. Negative for gait problem and joint swelling.  Skin: Negative.   Neurological:  Positive for weakness.    Objective:  Physical Exam Constitutional:      Appearance: She is well-developed. She is ill-appearing.  HENT:     Head: Normocephalic and atraumatic.  Cardiovascular:     Rate and Rhythm: Normal rate and regular rhythm.  Pulmonary:     Effort: Pulmonary effort is normal. No respiratory distress.     Breath sounds: Normal breath sounds. No wheezing or rales.  Abdominal:     General: Bowel sounds are normal. There is no distension.     Palpations: Abdomen is soft.     Tenderness: There is no abdominal tenderness. There is no rebound.  Musculoskeletal:     Cervical back: Normal range of motion.     Right lower leg: Edema present.     Left lower leg: Edema present.  Skin:    General: Skin is warm and dry.  Neurological:     Mental Status: She is alert and oriented to person, place, and time.     Sensory: Sensory deficit present.     Motor: Weakness present.     Coordination: Coordination abnormal.     Vitals:   06/26/22 0835 06/26/22 0838  BP: (!) 160/100 (!) 160/100  Pulse: 71   Temp: (!) 97.5 F (36.4 C)   TempSrc: Oral   SpO2: 98%   Height: 5\' 11"  (1.803 m)     Assessment & Plan:  Visit time 25 minutes in face to face  communication with patient and coordination of care, additional 5 minutes spent in record review, coordination or care, ordering tests, communicating/referring to other healthcare professionals, documenting in medical records all on the same day of the visit for total time 30 minutes spent on the visit.

## 2022-06-26 NOTE — Assessment & Plan Note (Signed)
She had questions about this and went over renal function. She is unsure if she is getting labs ever and feels care is not standard. Agree to check CMP today however long term if she plans to get her care at SNF our care may be duplicative and it is unclear what benefit we can offer patient without her records.

## 2022-06-26 NOTE — Patient Instructions (Addendum)
We will check the labs today. Since we do not have records from your facility and they are providing most of your care it may be best for them to provide your care.

## 2022-06-26 NOTE — Assessment & Plan Note (Addendum)
Checking HgA1c but previously at goal with glipizide 2.5 mg daily assuming this is correct.

## 2022-06-29 DIAGNOSIS — E1122 Type 2 diabetes mellitus with diabetic chronic kidney disease: Secondary | ICD-10-CM | POA: Diagnosis not present

## 2022-06-29 DIAGNOSIS — I10 Essential (primary) hypertension: Secondary | ICD-10-CM | POA: Diagnosis not present

## 2022-07-08 DIAGNOSIS — E440 Moderate protein-calorie malnutrition: Secondary | ICD-10-CM | POA: Diagnosis not present

## 2022-07-08 DIAGNOSIS — R41841 Cognitive communication deficit: Secondary | ICD-10-CM | POA: Diagnosis not present

## 2022-07-09 DIAGNOSIS — F331 Major depressive disorder, recurrent, moderate: Secondary | ICD-10-CM | POA: Diagnosis not present

## 2022-07-24 DIAGNOSIS — F32A Depression, unspecified: Secondary | ICD-10-CM | POA: Diagnosis not present

## 2022-07-28 DIAGNOSIS — I10 Essential (primary) hypertension: Secondary | ICD-10-CM | POA: Diagnosis not present

## 2022-07-28 DIAGNOSIS — E1122 Type 2 diabetes mellitus with diabetic chronic kidney disease: Secondary | ICD-10-CM | POA: Diagnosis not present

## 2022-07-29 DIAGNOSIS — N390 Urinary tract infection, site not specified: Secondary | ICD-10-CM | POA: Diagnosis not present

## 2022-07-30 DIAGNOSIS — F331 Major depressive disorder, recurrent, moderate: Secondary | ICD-10-CM | POA: Diagnosis not present

## 2022-08-06 DIAGNOSIS — F331 Major depressive disorder, recurrent, moderate: Secondary | ICD-10-CM | POA: Diagnosis not present

## 2022-08-11 DIAGNOSIS — F32A Depression, unspecified: Secondary | ICD-10-CM | POA: Diagnosis not present

## 2022-08-13 DIAGNOSIS — I10 Essential (primary) hypertension: Secondary | ICD-10-CM | POA: Diagnosis not present

## 2022-08-13 DIAGNOSIS — M1990 Unspecified osteoarthritis, unspecified site: Secondary | ICD-10-CM | POA: Diagnosis not present

## 2022-08-13 DIAGNOSIS — Z7409 Other reduced mobility: Secondary | ICD-10-CM | POA: Diagnosis not present

## 2022-08-13 DIAGNOSIS — I890 Lymphedema, not elsewhere classified: Secondary | ICD-10-CM | POA: Diagnosis not present

## 2022-08-18 DIAGNOSIS — F32A Depression, unspecified: Secondary | ICD-10-CM | POA: Diagnosis not present

## 2022-08-22 DIAGNOSIS — N390 Urinary tract infection, site not specified: Secondary | ICD-10-CM | POA: Diagnosis not present

## 2022-08-25 DIAGNOSIS — N390 Urinary tract infection, site not specified: Secondary | ICD-10-CM | POA: Diagnosis not present

## 2022-08-28 DIAGNOSIS — F331 Major depressive disorder, recurrent, moderate: Secondary | ICD-10-CM | POA: Diagnosis not present

## 2022-08-28 DIAGNOSIS — G8929 Other chronic pain: Secondary | ICD-10-CM | POA: Diagnosis not present

## 2022-08-31 DIAGNOSIS — E1122 Type 2 diabetes mellitus with diabetic chronic kidney disease: Secondary | ICD-10-CM | POA: Diagnosis not present

## 2022-08-31 DIAGNOSIS — I10 Essential (primary) hypertension: Secondary | ICD-10-CM | POA: Diagnosis not present

## 2022-09-02 DIAGNOSIS — I890 Lymphedema, not elsewhere classified: Secondary | ICD-10-CM | POA: Diagnosis not present

## 2022-09-04 DIAGNOSIS — F32A Depression, unspecified: Secondary | ICD-10-CM | POA: Diagnosis not present

## 2022-09-10 DIAGNOSIS — E1122 Type 2 diabetes mellitus with diabetic chronic kidney disease: Secondary | ICD-10-CM | POA: Diagnosis not present

## 2022-09-10 DIAGNOSIS — I10 Essential (primary) hypertension: Secondary | ICD-10-CM | POA: Diagnosis not present

## 2022-09-15 DIAGNOSIS — G8929 Other chronic pain: Secondary | ICD-10-CM | POA: Diagnosis not present

## 2022-09-24 DIAGNOSIS — H1045 Other chronic allergic conjunctivitis: Secondary | ICD-10-CM | POA: Diagnosis not present

## 2022-09-24 DIAGNOSIS — H04123 Dry eye syndrome of bilateral lacrimal glands: Secondary | ICD-10-CM | POA: Diagnosis not present

## 2022-09-24 DIAGNOSIS — H5203 Hypermetropia, bilateral: Secondary | ICD-10-CM | POA: Diagnosis not present

## 2022-09-24 DIAGNOSIS — E119 Type 2 diabetes mellitus without complications: Secondary | ICD-10-CM | POA: Diagnosis not present

## 2022-09-24 DIAGNOSIS — H25813 Combined forms of age-related cataract, bilateral: Secondary | ICD-10-CM | POA: Diagnosis not present

## 2022-09-28 DIAGNOSIS — F32A Depression, unspecified: Secondary | ICD-10-CM | POA: Diagnosis not present

## 2022-10-01 DIAGNOSIS — R195 Other fecal abnormalities: Secondary | ICD-10-CM | POA: Diagnosis not present

## 2022-10-01 DIAGNOSIS — I890 Lymphedema, not elsewhere classified: Secondary | ICD-10-CM | POA: Diagnosis not present

## 2022-10-01 DIAGNOSIS — J3489 Other specified disorders of nose and nasal sinuses: Secondary | ICD-10-CM | POA: Diagnosis not present

## 2022-10-01 DIAGNOSIS — M7989 Other specified soft tissue disorders: Secondary | ICD-10-CM | POA: Diagnosis not present

## 2022-10-01 DIAGNOSIS — R82998 Other abnormal findings in urine: Secondary | ICD-10-CM | POA: Diagnosis not present

## 2022-10-01 DIAGNOSIS — E876 Hypokalemia: Secondary | ICD-10-CM | POA: Diagnosis not present

## 2022-10-01 DIAGNOSIS — Z7409 Other reduced mobility: Secondary | ICD-10-CM | POA: Diagnosis not present

## 2022-10-01 DIAGNOSIS — I10 Essential (primary) hypertension: Secondary | ICD-10-CM | POA: Diagnosis not present

## 2022-10-05 ENCOUNTER — Encounter: Payer: Self-pay | Admitting: Diagnostic Neuroimaging

## 2022-10-05 ENCOUNTER — Ambulatory Visit (INDEPENDENT_AMBULATORY_CARE_PROVIDER_SITE_OTHER): Payer: PPO | Admitting: Diagnostic Neuroimaging

## 2022-10-05 VITALS — BP 146/75 | HR 42 | Ht 71.0 in | Wt 220.5 lb

## 2022-10-05 DIAGNOSIS — M48062 Spinal stenosis, lumbar region with neurogenic claudication: Secondary | ICD-10-CM | POA: Diagnosis not present

## 2022-10-05 DIAGNOSIS — M4802 Spinal stenosis, cervical region: Secondary | ICD-10-CM

## 2022-10-05 NOTE — Progress Notes (Signed)
[facility]  PATIENT: [name] [name] DOB: 1945/11/05  REFERRING CLINICIAN: Letitia Libra, NP HISTORY FROM: patient  REASON FOR VISIT: new consult    HISTORICAL  CHIEF COMPLAINT:  Chief Complaint  Patient presents with   New Patient (Initial Visit)    Rm 6. Tremors, reports having visual hallucinations    HISTORY OF PRESENT ILLNESS:   UPDATE (10/05/22, VRP): Since last visit, continues with gait and mobility issues. Also went to hospital in Dec 2023 for pneumonia and UTI, sepsis. Some vision changes (seeing shapes and movement of wheels) is intermittent, but improved.   PRIOR HPI (06/17/20): 77 year old female here for evaluation of Wernicke's encephalopathy and cervical spinal stenosis.  Patient was living alone in independent living facility, when she started having intractable nausea vomiting in October November 2021.  She was unable to keep food down.  Ultimately she was found home alone, covered in excrement and bugs after not responding for 2 days from her daughter.  Police entered her home and took her to the hospital.  She was diagnosed with Wernicke's encephalopathy and other metabolic derangements.  She was treated medically and transferred to rehabilitation.  Also was found to have cervical spinal stenosis and referred to neurosurgery.    REVIEW OF SYSTEMS: Full 14 system review of systems performed and negative with exception of: As per HPI.  ALLERGIES: Allergies  Allergen Reactions   Oxycodone Hcl Nausea And Vomiting   Penicillins Itching    Remote reaction, no anaphylaxis or severe cutaneous symptoms Tolerates Cefepime    HOME MEDICATIONS: Outpatient Medications Prior to Visit  Medication Sig Dispense Refill   acetaminophen (TYLENOL) 500 MG tablet Take 1,000 mg by mouth every 8 (eight) hours as needed for mild pain or moderate pain.     albuterol (VENTOLIN HFA) 108 (90 Base) MCG/ACT inhaler Inhale 2 puffs into the lungs every 6 (six)  hours as needed for wheezing or shortness of breath.     apixaban (ELIQUIS) 2.5 MG TABS tablet Take by mouth 2 (two) times daily.     benzocaine (ORAJEL) 10 % mucosal gel Use as directed 1 application  in the mouth or throat every 6 (six) hours as needed (gum pain/irritation).     colchicine 0.6 MG tablet Take 1 tablet (0.6 mg total) by mouth daily.     cycloSPORINE (RESTASIS) 0.05 % ophthalmic emulsion 1 drop 2 (two) times daily.     feeding supplement, ENSURE COMPLETE, (ENSURE COMPLETE) LIQD Take 237 mLs by mouth 2 (two) times daily between meals. 13272 mL 11   fentaNYL (DURAGESIC) 12 MCG/HR Place onto the skin every 3 (three) days.     ferrous sulfate 325 (65 FE) MG tablet Take 1 tablet (325 mg total) by mouth daily with breakfast. 30 tablet 0   folic acid (FOLVITE) 1 MG tablet Take 1 mg by mouth daily.     glipiZIDE (GLUCOTROL XL) 2.5 MG 24 hr tablet Take 2.5 mg by mouth daily.     guaifenesin (HUMIBID E) 400 MG TABS tablet Take 400 mg by mouth every 4 (four) hours.     hydrocortisone 1 % lotion Apply 1 Application topically every 6 (six) hours as needed for itching (apply to right arm).     hydroxypropyl methylcellulose / hypromellose (ISOPTO TEARS / GONIOVISC) 2.5 % ophthalmic solution Place 1 drop into both eyes 4 (four) times daily as needed for dry eyes.     hydrOXYzine (ATARAX) 25 MG tablet Take 25 mg by mouth every 6 (six)  hours as needed for itching.     loperamide (IMODIUM A-D) 2 MG tablet Take 2 mg by mouth 2 (two) times daily.     loratadine (CLARITIN) 10 MG tablet Take 10 mg by mouth daily.     magnesium hydroxide (MILK OF MAGNESIA) 400 MG/5ML suspension Take 30 mLs by mouth daily as needed for mild constipation.     methocarbamol (ROBAXIN) 500 MG tablet Take 1,000 mg by mouth every 8 (eight) hours as needed for muscle spasms.     metoprolol tartrate (LOPRESSOR) 25 MG tablet Take 1 tablet (25 mg total) by mouth 2 (two) times daily. 60 tablet 0   montelukast (SINGULAIR) 10 MG tablet  Take 1 tablet (10 mg total) by mouth at bedtime. 90 tablet 3   Multiple Vitamin (MULTIVITAMIN WITH MINERALS) TABS tablet Take 1 tablet by mouth daily.     Multiple Vitamins-Minerals (PRESERVISION AREDS 2 PO) Take by mouth.     omeprazole (PRILOSEC) 40 MG capsule Take 40 mg by mouth daily.     ondansetron (ZOFRAN) 4 MG tablet Take 4 mg by mouth every 8 (eight) hours as needed for nausea or vomiting.     promethazine (PHENERGAN) 25 MG suppository Place 1 suppository (25 mg total) rectally every 6 (six) hours as needed for nausea or vomiting. 30 each 0   Propylene Glycol (SYSTANE COMPLETE OP) Apply 1 drop to eye 2 (two) times daily.     sertraline (ZOLOFT) 25 MG tablet Take 25 mg by mouth daily.     sodium chloride (OCEAN) 0.65 % SOLN nasal spray Place 1 spray into both nostrils every 2 (two) hours as needed for congestion (nasal dryness).     sucralfate (CARAFATE) 1 g tablet Take 1 g by mouth 4 (four) times daily. Dissolve tablets     thiamine 100 MG tablet Take 1 tablet (100 mg total) by mouth daily.     traMADol (ULTRAM) 50 MG tablet Take 50 mg by mouth every 8 (eight) hours as needed.     trolamine salicylate (ASPERCREME) 10 % cream Apply 1 Application topically as needed for muscle pain.     diclofenac Sodium (VOLTAREN) 1 % GEL Apply 4 g topically 4 (four) times daily. Apply to lower back     nystatin (MYCOSTATIN/NYSTOP) powder SMARTSIG:2 Topical Twice Daily     pantoprazole (PROTONIX) 40 MG tablet Take 1 tablet (40 mg total) by mouth daily.     No facility-administered medications prior to visit.    PAST MEDICAL HISTORY: Past Medical History:  Diagnosis Date   Acute kidney injury (HCC)    Aortic stenosis, mild 11/17/2013   Arthritis    "both knees, spine, back, fingers" (11/29/2013)   CHF (congestive heart failure) (HCC)    Edema    GERD (gastroesophageal reflux disease)    Heart murmur    History of diastolic dysfunction    Echo 07/2008 with diastolic dysfunction   HTN  (hypertension)    Morbid obesity (HCC)    OA (osteoarthritis)    PAC (premature atrial contraction)    per prior Holter   Palpitations    Pneumonia    "as a child"   PVC's (premature ventricular contractions)    per prior Holter   SVT (supraventricular tachycardia)    Tachycardia    noted at 07/28/11 visit. Started on beta blocker. Possible atrial flutter vs long PR tachycardia/AVNRT   Type II diabetes mellitus (HCC)     PAST SURGICAL HISTORY: Past Surgical History:  Procedure Laterality Date  BIOPSY  03/23/2020   Procedure: BIOPSY;  Surgeon: Charna Elizabeth, MD;  Location: [facility] ENDOSCOPY;  Service: Endoscopy;;   CESAREAN SECTION  1985   CESAREAN SECTION  1985   ESOPHAGOGASTRODUODENOSCOPY (EGD) WITH PROPOFOL N/A 03/23/2020   Procedure: ESOPHAGOGASTRODUODENOSCOPY (EGD) WITH PROPOFOL;  Surgeon: Charna Elizabeth, MD;  Location[facility] ENDOSCOPY;  Service: Endoscopy;  Laterality: N/A;   SUPRAVENTRICULAR TACHYCARDIA ABLATION  11/29/2013   SUPRAVENTRICULAR TACHYCARDIA ABLATION N/A 11/29/2013   Procedure: SUPRAVENTRICULAR TACHYCARDIA ABLATION;  Surgeon: Marinus Maw, MD;  Location: [facility] CATH LAB;  Service: Cardiovascular;  Laterality: N/A;   US ECHOCARDIOGRAPHY  07/25/2008   EF 55-60%   VAGINAL DELIVERY      FAMILY HISTORY: Family History  Problem Relation Age of Onset   Alzheimer's disease Mother    Lung cancer Mother    COPD Father    Diabetes Maternal Uncle    Stroke Neg Hx    Colon cancer Neg Hx    Stomach cancer Neg Hx    Esophageal cancer Neg Hx     SOCIAL HISTORY: Social History   Socioeconomic History   Marital status: Single    Spouse name: Not on file   Number of children: 2   Years of education: Not on file   Highest education level: Not on file  Occupational History   Occupation: housekeeping    Employer: [employer]    Comment: disabled   Occupation: retired  Tobacco Use   Smoking status: Former    Packs/day: 2.00    Years: 15.00    Additional pack years: 0.00     Total pack years: 30.00    Types: Cigarettes    Quit date: 11/23/1985    Years since quitting: 36.8   Smokeless tobacco: Never  Vaping Use   Vaping Use: Never used  Substance and Sexual Activity   Alcohol use: No   Drug use: No   Sexual activity: Never  Other Topics Concern   Not on file  Social History Narrative   06/17/20 lives at [facility]    Social Determinants of Health   Financial Resource Strain: Low Risk  (03/03/2022)   Overall Financial Resource Strain (CARDIA)    Difficulty of Paying Living Expenses: Not hard at all  Food Insecurity: No Food Insecurity (03/19/2022)   Hunger Vital Sign    Worried About Running Out of Food in the Last Year: Never true    Ran Out of Food in the Last Year: Never true  Transportation Needs: No Transportation Needs (03/19/2022)   PRAPARE - Administrator, Civil Service (Medical): No    Lack of Transportation (Non-Medical): No  Physical Activity: Inactive (03/03/2022)   Exercise Vital Sign    Days of Exercise per Week: 0 days    Minutes of Exercise per Session: 0 min  Stress: No Stress Concern Present (03/03/2022)   [employer] of Occupational Health - Occupational Stress Questionnaire    Feeling of Stress : Not at all  Social Connections: Socially Isolated (03/03/2022)   Social Connection and Isolation Panel [NHANES]    Frequency of Communication with Friends and Family: More than three times a week    Frequency of Social Gatherings with Friends and Family: Once a week    Attends Religious Services: Never    Database administrator or Organizations: No    Attends Banker Meetings: Never    Marital Status: Never married  Intimate Partner Violence: Not At Risk (03/19/2022)   Humiliation, Afraid, Rape,  and Kick questionnaire    Fear of Current or Ex-Partner: No    Emotionally Abused: No    Physically Abused: No    Sexually Abused: No     PHYSICAL EXAM  GENERAL EXAM/CONSTITUTIONAL: Vitals:  Vitals:    10/05/22 1037 10/05/22 1043  BP: (!) 146/75   Pulse: (!) 40 (!) 42  SpO2:  96%  Weight: 220 lb 8 oz (100 kg)   Height: 5\' 11"  (1.803 m)    Body mass index is 30.75 kg/m. Wt Readings from Last 3 Encounters:  10/05/22 220 lb 8 oz (100 kg)  03/19/22 214 lb (97.1 kg)  02/09/22 234 lb (106.1 kg)   Patient is in no distress; well developed, nourished and groomed; neck is supple  CARDIOVASCULAR: Examination of carotid arteries is normal; no carotid bruits BRADYCARDIA; REGULAR RHYTHM; no murmurs Examination of peripheral vascular system by observation and palpation is normal  EYES: Ophthalmoscopic exam of optic discs and posterior segments is normal; no papilledema or hemorrhages No results found.  MUSCULOSKELETAL: Gait, strength, tone, movements noted in Neurologic exam below  NEUROLOGIC: MENTAL STATUS:     05/11/2017    9:52 AM  MMSE - Mini Mental State Exam  Orientation to time 5  Orientation to Place 5  Registration 3  Attention/ Calculation 4  Recall 2  Language- name 2 objects 2  Language- repeat 1  Language- follow 3 step command 3  Language- read & follow direction 1  Write a sentence 1  Copy design 1  Total score 28   awake, alert, oriented to person, place and time recent and remote memory intact normal attention and concentration language fluent, comprehension intact, naming intact fund of knowledge appropriate  CRANIAL NERVE:  2nd - no papilledema on fundoscopic exam 2nd, 3rd, 4th, 6th - pupils equal and reactive to light, visual fields full to confrontation, extraocular muscles intact, no nystagmus 5th - facial sensation symmetric 7th - facial strength symmetric 8th - hearing intact 9th - palate elevates symmetrically, uvula midline 11th - shoulder shrug symmetric 12th - tongue protrusion midline  MOTOR:  normal bulk and tone BUE 3-4 PROX; 2-3 DISTAL WITH ATROPHY IN INTRINSIC HAND MUSCLES LUE DELTOID 1-2 LIMITED BY ROTATOR CUFF PAIN BLE HF 2-3;  KE/KF 3-4; LEFT DF 2-3; RIGHT DF 4  SENSORY:  normal and symmetric to light touch; DECR IN HANDS AND LEGS; ABSENT IN FEET  COORDINATION:  finger-nose-finger, fine finger movements normal  REFLEXES:  deep tendon reflexes TRACE and symmetric; NEGATIVE HOFFMAN'S  GAIT/STATION:  IN WHEEL CHAIR; LIMITED BY LOW BACK PAIN AND WEAKNESS     DIAGNOSTIC DATA (LABS, IMAGING, TESTING) - I reviewed patient records, labs, notes, testing and imaging myself where available.  Lab Results  Component Value Date   WBC 4.6 06/26/2022   HGB 11.7 (L) 06/26/2022   HCT 36.4 06/26/2022   MCV 81.9 06/26/2022   PLT 189.0 06/26/2022      Component Value Date/Time   NA 144 06/26/2022 0916   K 4.0 06/26/2022 0916   CL 105 06/26/2022 0916   CO2 29 06/26/2022 0916   GLUCOSE 65 (L) 06/26/2022 0916   BUN 19 06/26/2022 0916   CREATININE 0.93 06/26/2022 0916   CALCIUM 9.9 06/26/2022 0916   PROT 8.1 06/26/2022 0916   ALBUMIN 4.1 06/26/2022 0916   AST 19 06/26/2022 0916   ALT 15 06/26/2022 0916   ALKPHOS 99 06/26/2022 0916   BILITOT 0.4 06/26/2022 0916   GFRNONAA 58 (L) 03/22/2022 1610  GFRAA 41 (L) 01/09/2019 0456   Lab Results  Component Value Date   CHOL 186 06/26/2022   HDL 84.30 06/26/2022   LDLCALC 86 06/26/2022   TRIG 82.0 06/26/2022   CHOLHDL 2 06/26/2022   Lab Results  Component Value Date   HGBA1C 5.8 06/26/2022   Lab Results  Component Value Date   VITAMINB12 1,080 (H) 02/17/2021   Lab Results  Component Value Date   TSH 3.857 03/18/2022   03/19/20   Ref Range & Units 3 mo ago  Vitamin B1 (Thiamine) 66.5 - 200.0 nmol/L 28.0 Low      12/18/06 CT lumbar spine - Severe multilevel degenerative disc and facet disease changes of lumbar spine with areas of neural foraminal stenosis and disc herniation as above.   03/18/20 MRI brain [I reviewed images myself and agree with interpretation. -VRP]  - Diffusion-weighted and T2/FLAIR hyperintense signal abnormality within the  medial thalami, periaqueductal gray matter and dorsal medulla. These findings are highly suggestive of Wernicke encephalopathy. - Mild generalized parenchymal atrophy and chronic small vessel ischemic disease. - Cervical spondylosis. At C3-C4, grade 1 retrolisthesis contributes to suspected at least moderate spinal canal stenosis and contacts the ventral spinal cord. At least moderate spinal canal stenosis is also suspected at C4-C5. - Moderate-sized right maxillary sinus mucous retention cyst.  03/20/20 MRI cervical spine [I reviewed images myself and agree with interpretation. -VRP]  - Limited study. The patient was not able to hold still or complete the study. - Multilevel spondylosis causing significant spinal and foraminal stenosis at multiple levels. Recommend CT cervical spine for further Evaluation.  03/20/20 CT cervical spine [I reviewed images myself and agree with interpretation. -VRP]  1. Moderate spinal canal stenosis and severe bilateral foraminal stenosis at C3-4 and C4-5. 2. No acute fracture or static subluxation of the cervical spine.  03/18/22 MRI brain  1. Unremarkable for age appearance of the brain with no acute intracranial pathology. 2. Degenerative change at C3-C4 resulting in moderate to severe spinal canal stenosis with cord compression, similar to 2021.        ASSESSMENT AND PLAN  77 y.o. year old female here with:  Dx:  1. Cervical stenosis of spinal canal   2. Spinal stenosis of lumbar region with neurogenic claudication         PLAN:  CERVICAL SPINAL STENOSIS / CERVICAL RADICULOPATHY - C3-4 cord compression; patient not interested in surgery at this time (prior NSGY evaluation recommended surgery, but patient declined due to age, fraility) - continue supportive care and PT / OT exercises [facility])  LUMBAR SPINAL STENOSIS - chronic since 2008; continue conservative mgmt  WERNICKE'S ENCEPHALOPATHY (2021) - due to intractable  nausea vomiting and poor p.o. intake; now doing better with thiamine supplementation - continue thiamine supplementation; follow up with PCP  Return for return to PCP.    Suanne Marker, MD 10/05/2022, 11:22 AM Certified in Neurology, Neurophysiology and Neuroimaging  [facility] [address], [address] [place], [place] [address] [phone]

## 2022-10-05 NOTE — Patient Instructions (Signed)
  CERVICAL SPINAL STENOSIS / CERVICAL RADICULOPATHY - C3-4 cord compression; patient not interested in surgery at this time (prior NSGY evaluation recommended surgery, but patient declined due to age, fraility) - continue supportive care and PT / OT exercises [facility])  LUMBAR SPINAL STENOSIS - chronic since 2008; continue conservative mgmt  WERNICKE'S ENCEPHALOPATHY (2021) - due to intractable nausea vomiting and poor p.o. intake; now doing better with thiamine supplementation - continue thiamine supplementation; follow up with PCP

## 2022-10-12 DIAGNOSIS — M1990 Unspecified osteoarthritis, unspecified site: Secondary | ICD-10-CM | POA: Diagnosis not present

## 2022-10-12 DIAGNOSIS — D649 Anemia, unspecified: Secondary | ICD-10-CM | POA: Diagnosis not present

## 2022-10-12 DIAGNOSIS — G8929 Other chronic pain: Secondary | ICD-10-CM | POA: Diagnosis not present

## 2022-10-12 DIAGNOSIS — E1122 Type 2 diabetes mellitus with diabetic chronic kidney disease: Secondary | ICD-10-CM | POA: Diagnosis not present

## 2022-10-12 DIAGNOSIS — I10 Essential (primary) hypertension: Secondary | ICD-10-CM | POA: Diagnosis not present

## 2022-10-12 DIAGNOSIS — N182 Chronic kidney disease, stage 2 (mild): Secondary | ICD-10-CM | POA: Diagnosis not present

## 2022-10-14 DIAGNOSIS — G8929 Other chronic pain: Secondary | ICD-10-CM | POA: Diagnosis not present

## 2022-10-20 DIAGNOSIS — F32A Depression, unspecified: Secondary | ICD-10-CM | POA: Diagnosis not present

## 2022-10-21 ENCOUNTER — Other Ambulatory Visit (INDEPENDENT_AMBULATORY_CARE_PROVIDER_SITE_OTHER): Payer: PPO

## 2022-10-21 ENCOUNTER — Ambulatory Visit (INDEPENDENT_AMBULATORY_CARE_PROVIDER_SITE_OTHER): Payer: PPO | Admitting: Surgical

## 2022-10-21 ENCOUNTER — Other Ambulatory Visit: Payer: Self-pay

## 2022-10-21 DIAGNOSIS — M19012 Primary osteoarthritis, left shoulder: Secondary | ICD-10-CM

## 2022-10-21 DIAGNOSIS — M25512 Pain in left shoulder: Secondary | ICD-10-CM

## 2022-10-22 ENCOUNTER — Encounter: Payer: Self-pay | Admitting: Surgical

## 2022-10-22 NOTE — Progress Notes (Signed)
Office Visit Note   Patient: [name] [name]           Date of Birth: November 09, 1945           MRN: [id number] Visit Date: 10/21/2022 Requested by: Myrlene Broker, MD [address] [place],  [place] [address] PCP: Myrlene Broker, MD  Subjective: Chief Complaint  Patient presents with   Left Shoulder - Pain    HPI: [name] [name] is a 77 y.o. female who presents to the office reporting left shoulder pain.  Patient is a right-hand-dominant individual who complains of chronic left shoulder pain since she was in her 30s.  She states that she saw a doctor at that point who diagnosed her with rotator cuff injury but stated that there was not "anything that they could do for her".  She describes lateral shoulder pain that is not relieved with the ibuprofen she takes occasionally.  She has pain with overhead motion.  No history of prior shoulder surgery.  Does have some tingling into her hand and some decreased strength in the shoulder.  Feels like it is hard for her to lift things out in front of her.  She does have follow-up with neurology and neurosurgery in the past due to significant cervical spine pathology.  Has some occasional crepitus in the shoulder.  Has history of diabetes with last A1c is 5.8.Marland Kitchen                ROS: All systems reviewed are negative as they relate to the chief complaint within the history of present illness.  Patient denies fevers or chills.  Assessment & Plan: Visit Diagnoses:  1. Arthritis of left shoulder region   2. Left shoulder pain, unspecified chronicity     Plan: Patient is a 77 year old female who presents for evaluation of left shoulder pain.  Has history of left shoulder injury decades ago that was never treated.  No history of prior surgery.  On exam, she does have significant weakness of her rotator cuff and a lot of difficulty reaching out and away from her body due to left shoulder pain and weakness.  She has radiographs taken today  demonstrating glenohumeral arthritis.  We discussed options available to patient.  She is not interested in any surgical intervention.  Does have significant cervical spine pathology which may account for some of her symptoms.  She would like to try left glenohumeral injection today.  This was administered under ultrasound guidance and patient tolerated procedure well without complication.  Follow-up as needed if pain does not improve.  Follow-Up Instructions: No follow-ups on file.   Orders:  Orders Placed This Encounter  Procedures   XR Shoulder Left   US Guided Needle Placement - No Linked Charges   No orders of the defined types were placed in this encounter.     Procedures: No procedures performed   Clinical Data: No additional findings.  Objective: Vital Signs: There were no vitals taken for this visit.  Physical Exam:  Constitutional: Patient appears well-developed HEENT:  Head: Normocephalic Eyes:EOM are normal Neck: Normal range of motion Cardiovascular: Normal rate Pulmonary/chest: Effort normal Neurologic: Patient is alert Skin: Skin is warm Psychiatric: Patient has normal mood and affect  Ortho Exam: Ortho exam demonstrates left shoulder with 60 degrees X rotation, 90 degrees abduction, 140 degrees forward elevation passively.  She has active range of motion of the left shoulder to 90 degrees of forward flexion and abduction.  She has positive Hornblower sign.  Positive external Tatian lag sign.  There is crepitus noted with passive motion of the shoulder.  She has external rotation weakness rated 3/5 of the left shoulder relative to 5/5 on the right.  Intact EPL, FPL, finger abduction, pronation/supination, bicep, tricep, deltoid.  Axillary nerve intact with deltoid firing.  No cellulitis or skin changes noted.  Specialty Comments:  No specialty comments available.  Imaging: No results found.   PMFS History: Patient Active Problem List   Diagnosis Date  Noted   Pressure injury of skin 03/25/2022   History of duodenal ulcer 03/25/2022   History of esophagitis 03/25/2022   Esophageal dysmotility 03/25/2022   Cervical stenosis of spinal canal 03/25/2022   Multifocal pneumonia 03/25/2022   Malnutrition of moderate degree 03/21/2022   Polyarthralgia    Wernicke's aphasia    Allergic rhinitis 07/28/2019   Postnasal drip 11/16/2017   Sensorineural hearing loss (SNHL), bilateral 11/16/2017   Tinnitus of both ears 11/16/2017   CKD (chronic kidney disease), stage III (HCC) 11/08/2017   Anemia 11/07/2017   Lymphedema 11/07/2017   Routine general medical examination at a health care facility 05/10/2014   Paroxysmal SVT (supraventricular tachycardia) (HCC) 11/28/2013   Physical deconditioning 11/23/2013   Generalized weakness 11/17/2013   Multilevel spine pain 11/17/2013   Aortic stenosis, mild 11/17/2013   DM type 2 (diabetes mellitus, type 2) (HCC) 02/02/2013   Arthritis 10/19/2012   Mixed incontinence 10/19/2012   GERD (gastroesophageal reflux disease) 04/20/2011   HTN (hypertension) 01/20/2011   Morbid obesity (HCC)    Past Medical History:  Diagnosis Date   Acute kidney injury (HCC)    Aortic stenosis, mild 11/17/2013   Arthritis    "both knees, spine, back, fingers" (11/29/2013)   CHF (congestive heart failure) (HCC)    Edema    GERD (gastroesophageal reflux disease)    Heart murmur    History of diastolic dysfunction    Echo 07/2008 with diastolic dysfunction   HTN (hypertension)    Morbid obesity (HCC)    OA (osteoarthritis)    PAC (premature atrial contraction)    per prior Holter   Palpitations    Pneumonia    "as a child"   PVC's (premature ventricular contractions)    per prior Holter   SVT (supraventricular tachycardia)    Tachycardia    noted at 07/28/11 visit. Started on beta blocker. Possible atrial flutter vs long PR tachycardia/AVNRT   Type II diabetes mellitus (HCC)     Family History  Problem Relation Age  of Onset   Alzheimer's disease Mother    Lung cancer Mother    COPD Father    Diabetes Maternal Uncle    Stroke Neg Hx    Colon cancer Neg Hx    Stomach cancer Neg Hx    Esophageal cancer Neg Hx     Past Surgical History:  Procedure Laterality Date   BIOPSY  03/23/2020   Procedure: BIOPSY;  Surgeon: Charna Elizabeth, MD;  Location: [facility] ENDOSCOPY;  Service: Endoscopy;;   CESAREAN SECTION  1985   CESAREAN SECTION  1985   ESOPHAGOGASTRODUODENOSCOPY (EGD) WITH PROPOFOL N/A 03/23/2020   Procedure: ESOPHAGOGASTRODUODENOSCOPY (EGD) WITH PROPOFOL;  Surgeon: Charna Elizabeth, MD;  Location: [facility] ENDOSCOPY;  Service: Endoscopy;  Laterality: N/A;   SUPRAVENTRICULAR TACHYCARDIA ABLATION  11/29/2013   SUPRAVENTRICULAR TACHYCARDIA ABLATION N/A 11/29/2013   Procedure: SUPRAVENTRICULAR TACHYCARDIA ABLATION;  Surgeon: Marinus Maw, MD;  Location: [facility] CATH LAB;  Service: Cardiovascular;  Laterality: N/A;   US ECHOCARDIOGRAPHY  07/25/2008  EF 55-60%   VAGINAL DELIVERY     Social History   Occupational History   Occupation: Equities trader: [employer]    Comment: disabled   Occupation: retired  Tobacco Use   Smoking status: Former    Current packs/day: 0.00    Average packs/day: 2.0 packs/day for 15.0 years (30.0 ttl pk-yrs)    Types: Cigarettes    Start date: 11/24/1970    Quit date: 11/23/1985    Years since quitting: 36.9   Smokeless tobacco: Never  Vaping Use   Vaping status: Never Used  Substance and Sexual Activity   Alcohol use: No   Drug use: No   Sexual activity: Never

## 2022-10-23 DIAGNOSIS — F32A Depression, unspecified: Secondary | ICD-10-CM | POA: Diagnosis not present

## 2022-11-06 DIAGNOSIS — B351 Tinea unguium: Secondary | ICD-10-CM | POA: Diagnosis not present

## 2022-11-06 DIAGNOSIS — E1159 Type 2 diabetes mellitus with other circulatory complications: Secondary | ICD-10-CM | POA: Diagnosis not present

## 2022-11-10 DIAGNOSIS — F331 Major depressive disorder, recurrent, moderate: Secondary | ICD-10-CM | POA: Diagnosis not present

## 2022-11-11 ENCOUNTER — Telehealth: Payer: Self-pay

## 2022-11-11 NOTE — Progress Notes (Signed)
Pharmacy Quality Measure Review  This patient is appearing on a report for being at risk of failing the adherence measure for diabetes medications this calendar year.   Medication: glipizide 2.5 mg Last fill date: 10/06/22 for 30 day supply (dispense report not updated since 7/17)  Insurance report was not up to date. No action needed at this time.    Nils Pyle, PharmD PGY1 Pharmacy Resident

## 2022-12-01 NOTE — Telephone Encounter (Signed)
Pt only need part B Faxed over for transportation. Pease advise.

## 2022-12-02 DIAGNOSIS — E440 Moderate protein-calorie malnutrition: Secondary | ICD-10-CM | POA: Diagnosis not present

## 2022-12-02 DIAGNOSIS — F802 Mixed receptive-expressive language disorder: Secondary | ICD-10-CM | POA: Diagnosis not present

## 2022-12-02 DIAGNOSIS — R2241 Localized swelling, mass and lump, right lower limb: Secondary | ICD-10-CM | POA: Diagnosis not present

## 2022-12-02 DIAGNOSIS — E119 Type 2 diabetes mellitus without complications: Secondary | ICD-10-CM | POA: Diagnosis not present

## 2022-12-02 DIAGNOSIS — N189 Chronic kidney disease, unspecified: Secondary | ICD-10-CM | POA: Diagnosis not present

## 2022-12-02 DIAGNOSIS — I350 Nonrheumatic aortic (valve) stenosis: Secondary | ICD-10-CM | POA: Diagnosis not present

## 2022-12-02 DIAGNOSIS — L97811 Non-pressure chronic ulcer of other part of right lower leg limited to breakdown of skin: Secondary | ICD-10-CM | POA: Diagnosis not present

## 2022-12-08 DIAGNOSIS — L97211 Non-pressure chronic ulcer of right calf limited to breakdown of skin: Secondary | ICD-10-CM | POA: Diagnosis not present

## 2022-12-09 DIAGNOSIS — N189 Chronic kidney disease, unspecified: Secondary | ICD-10-CM | POA: Diagnosis not present

## 2022-12-09 DIAGNOSIS — I350 Nonrheumatic aortic (valve) stenosis: Secondary | ICD-10-CM | POA: Diagnosis not present

## 2022-12-09 DIAGNOSIS — L97812 Non-pressure chronic ulcer of other part of right lower leg with fat layer exposed: Secondary | ICD-10-CM | POA: Diagnosis not present

## 2022-12-09 DIAGNOSIS — E119 Type 2 diabetes mellitus without complications: Secondary | ICD-10-CM | POA: Diagnosis not present

## 2022-12-09 DIAGNOSIS — R2241 Localized swelling, mass and lump, right lower limb: Secondary | ICD-10-CM | POA: Diagnosis not present

## 2022-12-09 DIAGNOSIS — F802 Mixed receptive-expressive language disorder: Secondary | ICD-10-CM | POA: Diagnosis not present

## 2022-12-09 DIAGNOSIS — E440 Moderate protein-calorie malnutrition: Secondary | ICD-10-CM | POA: Diagnosis not present

## 2022-12-14 DIAGNOSIS — R5381 Other malaise: Secondary | ICD-10-CM | POA: Diagnosis not present

## 2022-12-14 DIAGNOSIS — I82409 Acute embolism and thrombosis of unspecified deep veins of unspecified lower extremity: Secondary | ICD-10-CM | POA: Diagnosis not present

## 2022-12-14 DIAGNOSIS — E119 Type 2 diabetes mellitus without complications: Secondary | ICD-10-CM | POA: Diagnosis not present

## 2022-12-14 DIAGNOSIS — G8929 Other chronic pain: Secondary | ICD-10-CM | POA: Diagnosis not present

## 2022-12-14 DIAGNOSIS — I890 Lymphedema, not elsewhere classified: Secondary | ICD-10-CM | POA: Diagnosis not present

## 2022-12-14 DIAGNOSIS — K068 Other specified disorders of gingiva and edentulous alveolar ridge: Secondary | ICD-10-CM | POA: Diagnosis not present

## 2022-12-14 DIAGNOSIS — N1831 Chronic kidney disease, stage 3a: Secondary | ICD-10-CM | POA: Diagnosis not present

## 2022-12-15 DIAGNOSIS — G4700 Insomnia, unspecified: Secondary | ICD-10-CM | POA: Diagnosis not present

## 2022-12-15 DIAGNOSIS — F32A Depression, unspecified: Secondary | ICD-10-CM | POA: Diagnosis not present

## 2022-12-16 DIAGNOSIS — E440 Moderate protein-calorie malnutrition: Secondary | ICD-10-CM | POA: Diagnosis not present

## 2022-12-16 DIAGNOSIS — F802 Mixed receptive-expressive language disorder: Secondary | ICD-10-CM | POA: Diagnosis not present

## 2022-12-16 DIAGNOSIS — N189 Chronic kidney disease, unspecified: Secondary | ICD-10-CM | POA: Diagnosis not present

## 2022-12-16 DIAGNOSIS — R2241 Localized swelling, mass and lump, right lower limb: Secondary | ICD-10-CM | POA: Diagnosis not present

## 2022-12-16 DIAGNOSIS — I350 Nonrheumatic aortic (valve) stenosis: Secondary | ICD-10-CM | POA: Diagnosis not present

## 2022-12-16 DIAGNOSIS — E119 Type 2 diabetes mellitus without complications: Secondary | ICD-10-CM | POA: Diagnosis not present

## 2022-12-16 DIAGNOSIS — L97811 Non-pressure chronic ulcer of other part of right lower leg limited to breakdown of skin: Secondary | ICD-10-CM | POA: Diagnosis not present

## 2022-12-22 DIAGNOSIS — F331 Major depressive disorder, recurrent, moderate: Secondary | ICD-10-CM | POA: Diagnosis not present

## 2022-12-23 DIAGNOSIS — G8929 Other chronic pain: Secondary | ICD-10-CM | POA: Diagnosis not present

## 2022-12-23 DIAGNOSIS — I890 Lymphedema, not elsewhere classified: Secondary | ICD-10-CM | POA: Diagnosis not present

## 2022-12-29 DIAGNOSIS — I890 Lymphedema, not elsewhere classified: Secondary | ICD-10-CM | POA: Diagnosis not present

## 2022-12-30 DIAGNOSIS — G4700 Insomnia, unspecified: Secondary | ICD-10-CM | POA: Diagnosis not present

## 2022-12-30 DIAGNOSIS — F32A Depression, unspecified: Secondary | ICD-10-CM | POA: Diagnosis not present

## 2023-01-01 DIAGNOSIS — G8929 Other chronic pain: Secondary | ICD-10-CM | POA: Diagnosis not present

## 2023-01-11 ENCOUNTER — Ambulatory Visit: Payer: PPO | Admitting: Occupational Therapy

## 2023-01-13 ENCOUNTER — Telehealth: Payer: Self-pay | Admitting: Internal Medicine

## 2023-01-13 ENCOUNTER — Ambulatory Visit: Payer: PPO | Admitting: Occupational Therapy

## 2023-01-13 NOTE — Telephone Encounter (Signed)
Error

## 2023-01-14 ENCOUNTER — Telehealth: Payer: Self-pay | Admitting: Internal Medicine

## 2023-01-14 NOTE — Telephone Encounter (Signed)
Please advise 

## 2023-01-14 NOTE — Telephone Encounter (Signed)
Pt called wanting Dr. Okey Dupre opinion about if she need to stay in assisting living. Pt wants to leave but the people there states she needs to stay. Please advise.

## 2023-01-14 NOTE — Telephone Encounter (Signed)
I have talked to [facility] about this patient before but given her situation I am not sure she should continue to be my patient. She does live at a facility and I am unsure of outcome but there was a guardianship hearing previously so I am unsure if she can make decision on where to live. I suspect it would be better care for her to use facility doctor as her doctor as they are managing her medications and monitoring her labs etc.

## 2023-01-15 DIAGNOSIS — I10 Essential (primary) hypertension: Secondary | ICD-10-CM | POA: Diagnosis not present

## 2023-01-15 DIAGNOSIS — R5383 Other fatigue: Secondary | ICD-10-CM | POA: Diagnosis not present

## 2023-01-19 NOTE — Telephone Encounter (Signed)
Please advise 

## 2023-01-21 DIAGNOSIS — B351 Tinea unguium: Secondary | ICD-10-CM | POA: Diagnosis not present

## 2023-01-21 DIAGNOSIS — E1159 Type 2 diabetes mellitus with other circulatory complications: Secondary | ICD-10-CM | POA: Diagnosis not present

## 2023-01-21 NOTE — Telephone Encounter (Signed)
Thanks for following up.

## 2023-01-25 ENCOUNTER — Telehealth: Payer: PPO | Admitting: Internal Medicine

## 2023-01-26 DIAGNOSIS — G4700 Insomnia, unspecified: Secondary | ICD-10-CM | POA: Diagnosis not present

## 2023-01-26 DIAGNOSIS — F32A Depression, unspecified: Secondary | ICD-10-CM | POA: Diagnosis not present

## 2023-01-28 DIAGNOSIS — F331 Major depressive disorder, recurrent, moderate: Secondary | ICD-10-CM | POA: Diagnosis not present

## 2023-01-29 DIAGNOSIS — F331 Major depressive disorder, recurrent, moderate: Secondary | ICD-10-CM | POA: Diagnosis not present

## 2023-01-29 DIAGNOSIS — G8929 Other chronic pain: Secondary | ICD-10-CM | POA: Diagnosis not present

## 2023-02-09 DIAGNOSIS — R441 Visual hallucinations: Secondary | ICD-10-CM | POA: Diagnosis not present

## 2023-02-09 DIAGNOSIS — G8929 Other chronic pain: Secondary | ICD-10-CM | POA: Diagnosis not present

## 2023-02-16 DIAGNOSIS — N1831 Chronic kidney disease, stage 3a: Secondary | ICD-10-CM | POA: Diagnosis not present

## 2023-02-16 DIAGNOSIS — I10 Essential (primary) hypertension: Secondary | ICD-10-CM | POA: Diagnosis not present

## 2023-02-16 DIAGNOSIS — F0390 Unspecified dementia without behavioral disturbance: Secondary | ICD-10-CM | POA: Diagnosis not present

## 2023-02-16 DIAGNOSIS — M25552 Pain in left hip: Secondary | ICD-10-CM | POA: Diagnosis not present

## 2023-02-16 DIAGNOSIS — R195 Other fecal abnormalities: Secondary | ICD-10-CM | POA: Diagnosis not present

## 2023-02-18 DIAGNOSIS — F331 Major depressive disorder, recurrent, moderate: Secondary | ICD-10-CM | POA: Diagnosis not present

## 2023-02-22 DIAGNOSIS — F331 Major depressive disorder, recurrent, moderate: Secondary | ICD-10-CM | POA: Diagnosis not present

## 2023-02-25 ENCOUNTER — Ambulatory Visit: Payer: PPO | Attending: Nurse Practitioner | Admitting: Occupational Therapy

## 2023-02-25 DIAGNOSIS — I890 Lymphedema, not elsewhere classified: Secondary | ICD-10-CM | POA: Insufficient documentation

## 2023-03-02 ENCOUNTER — Encounter: Payer: Self-pay | Admitting: Occupational Therapy

## 2023-03-02 DIAGNOSIS — F32A Depression, unspecified: Secondary | ICD-10-CM | POA: Diagnosis not present

## 2023-03-02 DIAGNOSIS — G4700 Insomnia, unspecified: Secondary | ICD-10-CM | POA: Diagnosis not present

## 2023-03-02 NOTE — Therapy (Addendum)
OUTPATIENT OCCUPATIONAL THERAPY EVALUATION AND DISCHARGE SUMMARY  LOWER EXTREMITY LYMPHEDEMA   Patient Name: [name] [name] MRN: [id number] DOB:[birth year], 77 y.o., female Today's Date: 03/02/2023  END OF SESSION:   OT End of Session - 03/02/23 2054     Visit Number 1    Number of Visits 1    Date for OT Re-Evaluation 02/25/23    OT Start Time 0104    OT Stop Time 0215    OT Time Calculation (min) 71 min    Activity Tolerance Patient tolerated treatment well;No increased pain    Behavior During Therapy [facility] for tasks assessed/performed               Past Medical History:  Diagnosis Date   Acute kidney injury (HCC)    Aortic stenosis, mild 11/17/2013   Arthritis    "both knees, spine, back, fingers" (11/29/2013)   CHF (congestive heart failure) (HCC)    Edema    GERD (gastroesophageal reflux disease)    Heart murmur    History of diastolic dysfunction    Echo 07/2008 with diastolic dysfunction   HTN (hypertension)    Morbid obesity (HCC)    OA (osteoarthritis)    PAC (premature atrial contraction)    per prior Holter   Palpitations    Pneumonia    "as a child"   PVC's (premature ventricular contractions)    per prior Holter   SVT (supraventricular tachycardia) (HCC)    Tachycardia    noted at 07/28/11 visit. Started on beta blocker. Possible atrial flutter vs long PR tachycardia/AVNRT   Type II diabetes mellitus (HCC)    Past Surgical History:  Procedure Laterality Date   BIOPSY  03/23/2020   Procedure: BIOPSY;  Surgeon: Charna Elizabeth, MD;  Location: [facility] ENDOSCOPY;  Service: Endoscopy;;   CESAREAN SECTION  1985   CESAREAN SECTION  1985   ESOPHAGOGASTRODUODENOSCOPY (EGD) WITH PROPOFOL N/A 03/23/2020   Procedure: ESOPHAGOGASTRODUODENOSCOPY (EGD) WITH PROPOFOL;  Surgeon: Charna Elizabeth, MD;  Location: [facility] ENDOSCOPY;  Service: Endoscopy;  Laterality: N/A;   SUPRAVENTRICULAR TACHYCARDIA ABLATION  11/29/2013   SUPRAVENTRICULAR TACHYCARDIA ABLATION N/A 11/29/2013    Procedure: SUPRAVENTRICULAR TACHYCARDIA ABLATION;  Surgeon: Marinus Maw, MD;  Location: [facility] CATH LAB;  Service: Cardiovascular;  Laterality: N/A;   US ECHOCARDIOGRAPHY  07/25/2008   EF 55-60%   VAGINAL DELIVERY     Patient Active Problem List   Diagnosis Date Noted   Pressure injury of skin 03/25/2022   History of duodenal ulcer 03/25/2022   History of esophagitis 03/25/2022   Esophageal dysmotility 03/25/2022   Cervical stenosis of spinal canal 03/25/2022   Multifocal pneumonia 03/25/2022   Malnutrition of moderate degree 03/21/2022   Polyarthralgia    Wernicke's aphasia    Allergic rhinitis 07/28/2019   Postnasal drip 11/16/2017   Sensorineural hearing loss (SNHL), bilateral 11/16/2017   Tinnitus of both ears 11/16/2017   CKD (chronic kidney disease), stage III (HCC) 11/08/2017   Anemia 11/07/2017   Lymphedema 11/07/2017   Routine general medical examination at a health care facility 05/10/2014   Paroxysmal SVT (supraventricular tachycardia) (HCC) 11/28/2013   Physical deconditioning 11/23/2013   Generalized weakness 11/17/2013   Multilevel spine pain 11/17/2013   Aortic stenosis, mild 11/17/2013   DM type 2 (diabetes mellitus, type 2) (HCC) 02/02/2013   Arthritis 10/19/2012   Mixed incontinence 10/19/2012   GERD (gastroesophageal reflux disease) 04/20/2011   HTN (hypertension) 01/20/2011   Morbid obesity (HCC)     PCP: Terri Piedra, NP  REFERRING  PROVIDER: Terri Piedra, NP  REFERRING DIAG: I89.0  THERAPY DIAG:  Lymphedema, not elsewhere classified  Rationale for Evaluation and Treatment: Rehabilitation  ONSET DATE: "years ago" Recent exacerbation with open wounds and lymphorrhea  SUBJECTIVE:                                                                                                                                                                                    SUBJECTIVE STATEMENT: [name] [name] is referred to Occupational Therapy by Letitia Libra, NP,  for evaluation and treatment of BLE lymphedema. Pt is transported to clinic in a transport wheelchair. Both legs are wrapped with Coban from foot to popliteal fossa. Pt reports open area with lymphorrhea. Pt is unaccompanied. She is unable to complete transfer to treatment bed and reports she wears a brief due to incontinence. Pt requires Max assist for toileting. Pt reports she has had swelling in her legs for many years. She does not recall family history of limb swelling. Pt has not previously undergone lymphedema therapy. She tells me they have been wrapping her legs at the facility where she lives to control swelling and to help with wound healing.   PERTINENT HISTORY: Complex medical history and problem list with multiple co morbidities and environmental factors contributing to lymphedema, including osteoarthritis, CKD, obesity, HTN. Pt has hx of severe sepsis, DM type 2, multilevel spine pain, paroxysmal SVT, aortic stenosis, hearing loss, malnutrition, pressure injury to the skin, hx duodenal ulcer, cervical stenosis. Hx of multifocal pneumonia, E. Coli and UTI, chronic anxiety and depression, BLE lymphedema, L ankle pain 2/2  (prior Doppler negative for DVT) . Generalized weakness and physical deconditioning.  PAIN:  Are you having pain? Yes: NPRS scale: 5/10 Pain location: B legs Pain description: heavy, sore Aggravating factors: extended sitting w dependent positioning Relieving factors: elevation , Una Boots  PRECAUTIONS: Other: LYMPHEDEMA  WEIGHT BEARING RESTRICTIONS: No  FALLS:  Has patient fallen in last 6 months? No  LIVING ENVIRONMENT: Lives  in a skilled nursing facility Has following equipment at home: Wheelchair (manual) and Grab bars, accessible facility  OCCUPATION: retired  LEISURE: none  HAND DOMINANCE: right   PRIOR LEVEL OF FUNCTION: Requires assistive device for independence, Needs assistance with most basic and instrumental ADLs.  Needs assistance with  functional ambulation and transfers; Needs assistance with productive activities, leisure pursuits and social participation.   PATIENT GOALS: get this swelling down so I can move better   OBJECTIVE: Note: Objective measures were completed at Evaluation unless otherwise noted.  COGNITION:  Overall cognitive status: Unable to assess. Pt somewhat anxious and verbose throughout evaluation session  POSTURE: cervical kyphosis, posterior pelvic tilt- sacral  sitting in wc  LE ROM: NT  LE MMT: NT  SURGERY TYPE/DATE: non-cancer related  INFECTIONS: endorses prior episode of cellulitis  LYMPHEDEMA ASSESSMENTS: Unable to assess stage and severity of lymphedema due to [employer] in place. Pt reports current open leg wound w lymphorrhea  GAIT: Distance walked: n/a non ambulatory Assistive device utilized: transport w/c Level of assistance: Dependent Comments: Needs assistance w all transfers  LYMPHEDEMA LIFE IMPACT SCALE (LLIS):NA  FOTO Outcome measure: NA   TODAY'S TREATMENT:                                                                                                                                         OT Lymphedema Evaluation Pt education  PATIENT EDUCATION:  Education details: Discussed differential diagnoses of various swelling disorders. Provided basic level education regarding lymphatic structure and function, etiology, onset patterns, stages of progression, and prevention to limit infection risk, worsening condition and further functional decline. Pt edu for aught interaction between blood circulatory system and lymphatic circulation.Discussed  impact of gravity and co-morbidities on lymphatic function. Outlined Complete Decongestive Therapy (CDT)  as standard of care and provided in depth information regarding 4 primary components of Intensive and Self Management Phases, including Manual Lymph Drainage (MLD), compression wrapping and garments, skin care, and therapeutic  exercise. Homero Fellers discussion with re need for frequent attendance and high burden of care when caregiver is needed, impact of co morbidities. We discussed  the chronic, progressive nature of lymphedema and Importance of daily, ongoing LE self-care essential for limiting progression and infection risk.  Person educated: Patient Education method: Explanation, Demonstration, Tactile cues, Verbal cues, and Handouts Education comprehension: verbalized understanding, verbal cues required, and tactile cues required  HOME EXERCISE PROGRAM: Max, daily, ongoing, caregiver assistance with all lymphedema home program components, including:  daily multilayer compression wraps during Intensive Phase CDT, and daily assistance  with donning and doffing custom compression garments/ devices  during self-management phase. daily skin inspection and care daily lymphatic pumping there ex bilaterally Daily manual lymphatic drainage to BLE/ BLQ utilizing functional inguinal LN and deep abdominal lymphatics  ASSESSMENT:  CLINICAL IMPRESSION: Patient is a 77 y.o. female who was seen today for Occupational Therapy evaluation and treatment for BLE lymphedema. Etiology; is undetermined. Family hx is negative. Previous Doppler study from 2023 was negative for DVT, bit a more current study is indicated if Pt pursues OT for Complete Decongestive Therapy (CDT) in the future.   We were unable to fully evaluate skin condition and stage and grade lymphedema due to compression wraps applied at facility in place obscuring swelling and reported open leg wound. The lymphedema clinic does not provide wound care and we do not supply replacement compression wraps at initial evaluations.  This patient would benefit from OT for Complete Decongestive Therapy (CDT) to reduce limb swelling, decrease infection risk, and limit lymphedema progression.  Pt will need caregiver  assistance in this ambulatory, outpatient setting for dressing, toileting  and transfers. To achieve a successful clinical outcome daily, ongoing caregiver assistance throughout clinical treatment course and ongoing for the self-management phase is necessary because she is unable to reach feet and legs to apply daily multilayer compression wraps, to perform skin inspection, skin care and grooming, and to apply compression garments. For optimal outcome Pt requires daily, ongoing, daily caregiver assistance throughout the Intensive and Self-Management ongoing phase of Rx.Without daily, ongoing caregiver assistance , Pt's prognosis is poor. With daily, ongoing caregiver assistance with lymphedema management, prognosis is fair due to progression to date.  Intensive Phase CDT includes manual lymphatic drainage (MLD), daily skin inspection, care and grooming, multilayer compression bandaging and therapeutic exercise. Visits are needed multiple times weekly , typically for 3 or more months due to the bilateral nature of the presentation. Without skilled OT for CDT with daily , ongoing caregiver assistance the condition will progress and further functional decline is expected.  OBJECTIVE IMPAIRMENTS: BLE chronic, progressive, swelling and associated pain (Lymphedema), decreased activity tolerance, generalized weakness, physical deconditioning, decreased balance, impaired ST memory, decreased endurance, decreased knowledge of condition, decreased knowledge of use of DME, impaired functional mobility and transfers ( sliding board w assistance x 1),  impaired functional ambulation, difficulty walking, impaired flexibility, impaired sensation, impaired UE functional use, postural dysfunction, increased fall risk, increased risk of non healing wounds,   ACTIVITY LIMITATIONS: carrying, lifting, bending, sitting, standing, squatting, sleeping, stairs, transfers, bed mobility, bathing, toileting, dressing, reach over head, hygiene/grooming, and caring for others  PARTICIPATION LIMITATION: Unable  to perform full lymphedema self care home program without maximum caregiver assistance daily- Pt is unable to reach feet to don and doff compression bandages and garments,  to perform skin inspection an and skin care,  to perform simple self MLD,  and unable to perform standard  lymphatic pumping there ex without modification, printed reference and verbal cues.  PERSONAL FACTORS: Age, Fitness, Past/current experiences, Time since onset of injury/illness/exacerbation, and 3+ comorbidities: open leg ulcer by report, unable to tolerate adjustable compression by report,   are also affecting patient's functional outcome.   REHAB POTENTIAL: Poor without daily, ongoing caregiver assistance as outlined above.  Fair with daily, ongoing caregiver assistance with LE home program due to length of time with condition and progression  EVALUATION COMPLEXITY: High   GOALS: Goals reviewed with patient? Yes  SHORT TERM GOALS: Target date: 02/25/23   Pt presents with limited knowledge of lymphedema and treatment. Pt will demonstrate understanding of lymphedema etiology, progression,  treatment protocol, and need for ongoing, daily caregiver assistance  to limit exacerbation and further functional impairment. Baseline:Max A Goal status: GOAL MET. Printed reference provided   PLAN:  OOP FREQUENCY: 1 visit only. Intensive Phase frequency is typically 2-3 x weekly for 12 weeks and PRN to treat bilateral lower extremities and ft appropriate garments.  OT DURATION:  1 sessions  PLANNED INTERVENTIONS: Patient/Family education  PLAN FOR NEXT SESSION: NA. DC this date   Loel Dubonnet, MS, OTR/L, CLT-LANA 03/02/23 8:58 PM

## 2023-03-09 NOTE — Addendum Note (Signed)
Addended by: Judithann Sauger on: 03/09/2023 02:28 PM   Modules accepted: Orders

## 2023-03-10 DIAGNOSIS — K0889 Other specified disorders of teeth and supporting structures: Secondary | ICD-10-CM | POA: Diagnosis not present

## 2023-05-22 ENCOUNTER — Inpatient Hospital Stay (HOSPITAL_COMMUNITY)
Admission: EM | Admit: 2023-05-22 | Discharge: 2023-05-27 | DRG: 378 | Disposition: A | Discharge status: Skilled Nursing Facility | Payer: 59 | Source: Skilled Nursing Facility | Attending: Internal Medicine | Admitting: Internal Medicine

## 2023-05-22 ENCOUNTER — Other Ambulatory Visit: Payer: Self-pay

## 2023-05-22 ENCOUNTER — Encounter (HOSPITAL_COMMUNITY): Payer: Self-pay

## 2023-05-22 DIAGNOSIS — K219 Gastro-esophageal reflux disease without esophagitis: Secondary | ICD-10-CM | POA: Diagnosis present

## 2023-05-22 DIAGNOSIS — N1831 Chronic kidney disease, stage 3a: Secondary | ICD-10-CM | POA: Diagnosis not present

## 2023-05-22 DIAGNOSIS — Z885 Allergy status to narcotic agent status: Secondary | ICD-10-CM

## 2023-05-22 DIAGNOSIS — E1165 Type 2 diabetes mellitus with hyperglycemia: Secondary | ICD-10-CM | POA: Diagnosis present

## 2023-05-22 DIAGNOSIS — K922 Gastrointestinal hemorrhage, unspecified: Principal | ICD-10-CM | POA: Diagnosis present

## 2023-05-22 DIAGNOSIS — Z6830 Body mass index (BMI) 30.0-30.9, adult: Secondary | ICD-10-CM

## 2023-05-22 DIAGNOSIS — Z825 Family history of asthma and other chronic lower respiratory diseases: Secondary | ICD-10-CM

## 2023-05-22 DIAGNOSIS — K2980 Duodenitis without bleeding: Secondary | ICD-10-CM | POA: Diagnosis present

## 2023-05-22 DIAGNOSIS — F32A Depression, unspecified: Secondary | ICD-10-CM | POA: Diagnosis present

## 2023-05-22 DIAGNOSIS — E1122 Type 2 diabetes mellitus with diabetic chronic kidney disease: Secondary | ICD-10-CM | POA: Diagnosis present

## 2023-05-22 DIAGNOSIS — I130 Hypertensive heart and chronic kidney disease with heart failure and stage 1 through stage 4 chronic kidney disease, or unspecified chronic kidney disease: Secondary | ICD-10-CM | POA: Diagnosis present

## 2023-05-22 DIAGNOSIS — I471 Supraventricular tachycardia, unspecified: Secondary | ICD-10-CM | POA: Diagnosis present

## 2023-05-22 DIAGNOSIS — R251 Tremor, unspecified: Secondary | ICD-10-CM | POA: Diagnosis present

## 2023-05-22 DIAGNOSIS — M4802 Spinal stenosis, cervical region: Secondary | ICD-10-CM | POA: Diagnosis not present

## 2023-05-22 DIAGNOSIS — E870 Hyperosmolality and hypernatremia: Secondary | ICD-10-CM | POA: Diagnosis present

## 2023-05-22 DIAGNOSIS — Z820 Family history of epilepsy and other diseases of the nervous system: Secondary | ICD-10-CM

## 2023-05-22 DIAGNOSIS — K2971 Gastritis, unspecified, with bleeding: Secondary | ICD-10-CM | POA: Diagnosis present

## 2023-05-22 DIAGNOSIS — Z7401 Bed confinement status: Secondary | ICD-10-CM

## 2023-05-22 DIAGNOSIS — E1169 Type 2 diabetes mellitus with other specified complication: Secondary | ICD-10-CM

## 2023-05-22 DIAGNOSIS — K921 Melena: Principal | ICD-10-CM | POA: Diagnosis present

## 2023-05-22 DIAGNOSIS — N183 Chronic kidney disease, stage 3 unspecified: Secondary | ICD-10-CM | POA: Diagnosis present

## 2023-05-22 DIAGNOSIS — I10 Essential (primary) hypertension: Secondary | ICD-10-CM | POA: Diagnosis present

## 2023-05-22 DIAGNOSIS — E669 Obesity, unspecified: Secondary | ICD-10-CM | POA: Diagnosis present

## 2023-05-22 DIAGNOSIS — D649 Anemia, unspecified: Secondary | ICD-10-CM | POA: Diagnosis present

## 2023-05-22 DIAGNOSIS — Z7984 Long term (current) use of oral hypoglycemic drugs: Secondary | ICD-10-CM

## 2023-05-22 DIAGNOSIS — I890 Lymphedema, not elsewhere classified: Secondary | ICD-10-CM | POA: Diagnosis present

## 2023-05-22 DIAGNOSIS — Z888 Allergy status to other drugs, medicaments and biological substances status: Secondary | ICD-10-CM

## 2023-05-22 DIAGNOSIS — R32 Unspecified urinary incontinence: Secondary | ICD-10-CM | POA: Diagnosis present

## 2023-05-22 DIAGNOSIS — E119 Type 2 diabetes mellitus without complications: Secondary | ICD-10-CM

## 2023-05-22 DIAGNOSIS — I441 Atrioventricular block, second degree: Secondary | ICD-10-CM | POA: Diagnosis present

## 2023-05-22 DIAGNOSIS — Z87891 Personal history of nicotine dependence: Secondary | ICD-10-CM

## 2023-05-22 DIAGNOSIS — M5412 Radiculopathy, cervical region: Secondary | ICD-10-CM | POA: Diagnosis present

## 2023-05-22 DIAGNOSIS — Z7901 Long term (current) use of anticoagulants: Secondary | ICD-10-CM

## 2023-05-22 DIAGNOSIS — Z86718 Personal history of other venous thrombosis and embolism: Secondary | ICD-10-CM

## 2023-05-22 DIAGNOSIS — K209 Esophagitis, unspecified without bleeding: Secondary | ICD-10-CM | POA: Diagnosis present

## 2023-05-22 DIAGNOSIS — K2289 Other specified disease of esophagus: Secondary | ICD-10-CM | POA: Diagnosis present

## 2023-05-22 DIAGNOSIS — D631 Anemia in chronic kidney disease: Secondary | ICD-10-CM | POA: Diagnosis present

## 2023-05-22 DIAGNOSIS — K2970 Gastritis, unspecified, without bleeding: Secondary | ICD-10-CM | POA: Diagnosis present

## 2023-05-22 DIAGNOSIS — B3781 Candidal esophagitis: Secondary | ICD-10-CM | POA: Diagnosis present

## 2023-05-22 DIAGNOSIS — I4719 Other supraventricular tachycardia: Secondary | ICD-10-CM | POA: Diagnosis present

## 2023-05-22 DIAGNOSIS — K2981 Duodenitis with bleeding: Secondary | ICD-10-CM | POA: Diagnosis present

## 2023-05-22 DIAGNOSIS — Z833 Family history of diabetes mellitus: Secondary | ICD-10-CM

## 2023-05-22 DIAGNOSIS — E11649 Type 2 diabetes mellitus with hypoglycemia without coma: Secondary | ICD-10-CM | POA: Diagnosis present

## 2023-05-22 DIAGNOSIS — Z79899 Other long term (current) drug therapy: Secondary | ICD-10-CM

## 2023-05-22 DIAGNOSIS — Z880 Allergy status to penicillin: Secondary | ICD-10-CM

## 2023-05-22 DIAGNOSIS — I509 Heart failure, unspecified: Secondary | ICD-10-CM | POA: Diagnosis present

## 2023-05-22 DIAGNOSIS — Z801 Family history of malignant neoplasm of trachea, bronchus and lung: Secondary | ICD-10-CM

## 2023-05-22 DIAGNOSIS — Z8711 Personal history of peptic ulcer disease: Secondary | ICD-10-CM

## 2023-05-22 LAB — CBC
HCT: 34.1 % — ABNORMAL LOW (ref 36.0–46.0)
Hemoglobin: 10.6 g/dL — ABNORMAL LOW (ref 12.0–15.0)
MCH: 27.1 pg (ref 26.0–34.0)
MCHC: 31.1 g/dL (ref 30.0–36.0)
MCV: 87.2 fL (ref 80.0–100.0)
Platelets: 238 10*3/uL (ref 150–400)
RBC: 3.91 MIL/uL (ref 3.87–5.11)
RDW: 17.6 % — ABNORMAL HIGH (ref 11.5–15.5)
WBC: 4.8 10*3/uL (ref 4.0–10.5)
nRBC: 0 % (ref 0.0–0.2)

## 2023-05-22 LAB — TYPE AND SCREEN
ABO/RH(D): B POS
Antibody Screen: NEGATIVE

## 2023-05-22 LAB — COMPREHENSIVE METABOLIC PANEL
ALT: 21 U/L (ref 0–44)
AST: 27 U/L (ref 15–41)
Albumin: 3.4 g/dL — ABNORMAL LOW (ref 3.5–5.0)
Alkaline Phosphatase: 73 U/L (ref 38–126)
Anion gap: 11 (ref 5–15)
BUN: 18 mg/dL (ref 8–23)
CO2: 26 mmol/L (ref 22–32)
Calcium: 10.1 mg/dL (ref 8.9–10.3)
Chloride: 111 mmol/L (ref 98–111)
Creatinine, Ser: 1.11 mg/dL — ABNORMAL HIGH (ref 0.44–1.00)
GFR, Estimated: 52 mL/min — ABNORMAL LOW (ref >60)
Glucose, Bld: 61 mg/dL — ABNORMAL LOW (ref 70–99)
Potassium: 4.9 mmol/L (ref 3.5–5.1)
Sodium: 148 mmol/L — ABNORMAL HIGH (ref 135–145)
Total Bilirubin: 0.3 mg/dL (ref 0.0–1.2)
Total Protein: 7 g/dL (ref 6.5–8.1)

## 2023-05-22 LAB — CBC WITH DIFFERENTIAL/PLATELET
Abs Immature Granulocytes: 0.01 10*3/uL (ref 0.00–0.07)
Basophils Absolute: 0 10*3/uL (ref 0.0–0.1)
Basophils Relative: 1 %
Eosinophils Absolute: 0.1 10*3/uL (ref 0.0–0.5)
Eosinophils Relative: 2 %
HCT: 36.8 % (ref 36.0–46.0)
Hemoglobin: 11.6 g/dL — ABNORMAL LOW (ref 12.0–15.0)
Immature Granulocytes: 0 %
Lymphocytes Relative: 34 %
Lymphs Abs: 1.4 10*3/uL (ref 0.7–4.0)
MCH: 27.7 pg (ref 26.0–34.0)
MCHC: 31.5 g/dL (ref 30.0–36.0)
MCV: 87.8 fL (ref 80.0–100.0)
Monocytes Absolute: 0.3 10*3/uL (ref 0.1–1.0)
Monocytes Relative: 6 %
Neutro Abs: 2.4 10*3/uL (ref 1.7–7.7)
Neutrophils Relative %: 57 %
Platelets: 249 10*3/uL (ref 150–400)
RBC: 4.19 MIL/uL (ref 3.87–5.11)
RDW: 17.7 % — ABNORMAL HIGH (ref 11.5–15.5)
WBC: 4.2 10*3/uL (ref 4.0–10.5)
nRBC: 0 % (ref 0.0–0.2)

## 2023-05-22 LAB — POC OCCULT BLOOD, ED: Fecal Occult Bld: POSITIVE — AB

## 2023-05-22 LAB — PROTIME-INR
INR: 1.2 (ref 0.8–1.2)
Prothrombin Time: 14.9 s (ref 11.4–15.2)

## 2023-05-22 LAB — GLUCOSE, CAPILLARY: Glucose-Capillary: 87 mg/dL (ref 70–99)

## 2023-05-22 MED ORDER — INSULIN ASPART 100 UNIT/ML IJ SOLN
0.0000 [IU] | Freq: Three times a day (TID) | INTRAMUSCULAR | Status: DC
Start: 2023-05-23 — End: 2023-05-23

## 2023-05-22 MED ORDER — ACETAMINOPHEN 325 MG PO TABS
650.0000 mg | ORAL_TABLET | Freq: Four times a day (QID) | ORAL | Status: DC | PRN
  Administered 2023-05-23 – 2023-05-24 (×4): 650 mg via ORAL
  Filled 2023-05-22 (×5): qty 2

## 2023-05-22 MED ORDER — SODIUM CHLORIDE 0.9% FLUSH
3.0000 mL | Freq: Two times a day (BID) | INTRAVENOUS | Status: DC
Start: 2023-05-22 — End: 2023-05-27
  Administered 2023-05-22 – 2023-05-27 (×8): 3 mL via INTRAVENOUS

## 2023-05-22 MED ORDER — ACETAMINOPHEN 650 MG RE SUPP: 650.0000 mg | Freq: Four times a day (QID) | RECTAL | Status: DC | PRN

## 2023-05-22 MED ORDER — ENSURE ENLIVE PO LIQD
237.0000 mL | Freq: Two times a day (BID) | ORAL | Status: DC
  Administered 2023-05-23 – 2023-05-27 (×6): 237 mL via ORAL

## 2023-05-22 MED ORDER — CYCLOSPORINE 0.05 % OP EMUL
1.0000 [drp] | Freq: Two times a day (BID) | OPHTHALMIC | Status: DC
  Administered 2023-05-22 – 2023-05-27 (×8): 1 [drp] via OPHTHALMIC
  Filled 2023-05-22 (×11): qty 30

## 2023-05-22 MED ORDER — HYPROMELLOSE (GONIOSCOPIC) 2.5 % OP SOLN: 1.0000 [drp] | Freq: Four times a day (QID) | OPHTHALMIC | Status: DC | PRN

## 2023-05-22 MED ORDER — ALBUTEROL SULFATE (2.5 MG/3ML) 0.083% IN NEBU: 3.0000 mL | INHALATION_SOLUTION | Freq: Four times a day (QID) | RESPIRATORY_TRACT | Status: DC | PRN

## 2023-05-22 MED ORDER — PANTOPRAZOLE SODIUM 40 MG IV SOLR
40.0000 mg | Freq: Two times a day (BID) | INTRAVENOUS | Status: DC
  Administered 2023-05-23 – 2023-05-27 (×9): 40 mg via INTRAVENOUS
  Filled 2023-05-22 (×9): qty 10

## 2023-05-22 MED ORDER — PANTOPRAZOLE SODIUM 40 MG IV SOLR
40.0000 mg | Freq: Once | INTRAVENOUS | Status: AC
  Administered 2023-05-22: 40 mg via INTRAVENOUS

## 2023-05-22 MED ORDER — BUPROPION HCL 75 MG PO TABS
75.0000 mg | ORAL_TABLET | Freq: Three times a day (TID) | ORAL | Status: DC
  Administered 2023-05-22 – 2023-05-23 (×2): 75 mg via ORAL
  Filled 2023-05-22 (×18): qty 1

## 2023-05-22 MED ORDER — SODIUM CHLORIDE 0.9 % IV BOLUS
1000.0000 mL | Freq: Once | INTRAVENOUS | Status: AC
  Administered 2023-05-22: 1000 mL via INTRAVENOUS

## 2023-05-22 NOTE — ED Triage Notes (Signed)
Pt arrived via [facility] from [facility] for black tarry stool and abdominal [id number]. Pt is on eliquis. Pt in non-ambulatory at baseline. Pt states the abdominal pain is epigastric pain when she eats.

## 2023-05-22 NOTE — H&P (Addendum)
History and Physical   [name] [name] OZH:[id number] DOB: [birth year] DOA: 05/22/2023  PCP: Pcp, No   Patient coming from: [facility]  Chief Complaint: GI bleed, abdominal plain  HPI: [name] [name] is a 78 y.o. female with medical history significant of hypertension, diabetes, GERD, anemia, paroxysmal SVT, CKD 3, obesity, lymphedema, incontinence, Warnicke's aphasia, C-spine stenosis presenting with ongoing abdominal pain and black and tarry stools.  Patient arrives from rehab facility.  She has had a month of abdominal pain that is located in her epigastrium and is worse after eating.  For the past week she has been having black/tarry stools.  Patient is on Eliquis.  She is nonambulatory at baseline.  Reports loose stools recently as well. Also notes ~1 month intermittent tremors. Denies fevers, chills, chest pain, shortness of breath, constipation, diarrhea, vomiting  ED Course: Vital signs in ED notable for blood pressure in the 110s 140 systolic, heart rate in the 40s.  Lab workup included CMP with sodium 140, creatinine stable 1.11, glucose 61, albumin 3.4.  CBC with hemoglobin at 11.6.  FOBT positive in the ED.  Typed and screened in the ED.  1 L IV fluids and IV PPI ordered in the ED.  GI consulted and will see the patient.  Review of Systems: As per HPI otherwise all other systems reviewed and are negative.  Past Medical History:  Diagnosis Date   Acute kidney injury (HCC)    Aortic stenosis, mild 11/17/2013   Arthritis    "both knees, spine, back, fingers" (11/29/2013)   CHF (congestive heart failure) (HCC)    Edema    GERD (gastroesophageal reflux disease)    Heart murmur    History of diastolic dysfunction    Echo 07/2008 with diastolic dysfunction   HTN (hypertension)    Morbid obesity (HCC)    Multifocal pneumonia 03/25/2022   OA (osteoarthritis)    PAC (premature atrial contraction)    per prior Holter   Palpitations    Pneumonia    "as a child"   PVC's  (premature ventricular contractions)    per prior Holter   SVT (supraventricular tachycardia) (HCC)    Tachycardia    noted at 07/28/11 visit. Started on beta blocker. Possible atrial flutter vs long PR tachycardia/AVNRT   Type II diabetes mellitus (HCC)     Past Surgical History:  Procedure Laterality Date   BIOPSY  03/23/2020   Procedure: BIOPSY;  Surgeon: Charna Elizabeth, MD;  Location: [facility] ENDOSCOPY;  Service: Endoscopy;;   CESAREAN SECTION  1985   CESAREAN SECTION  1985   ESOPHAGOGASTRODUODENOSCOPY (EGD) WITH PROPOFOL N/A 03/23/2020   Procedure: ESOPHAGOGASTRODUODENOSCOPY (EGD) WITH PROPOFOL;  Surgeon: Charna Elizabeth, MD;  Location: [facility] ENDOSCOPY;  Service: Endoscopy;  Laterality: N/A;   SUPRAVENTRICULAR TACHYCARDIA ABLATION  11/29/2013   SUPRAVENTRICULAR TACHYCARDIA ABLATION N/A 11/29/2013   Procedure: SUPRAVENTRICULAR TACHYCARDIA ABLATION;  Surgeon: Marinus Maw, MD;  Location: [facility] CATH LAB;  Service: Cardiovascular;  Laterality: N/A;   US ECHOCARDIOGRAPHY  07/25/2008   EF 55-60%   VAGINAL DELIVERY      Social History  reports that she quit smoking about 37 years ago. Her smoking use included cigarettes. She started smoking about 52 years ago. She has a 30 pack-year smoking history. She has never used smokeless tobacco. She reports that she does not drink alcohol and does not use drugs.  Allergies  Allergen Reactions   Oxycodone Hcl Nausea And Vomiting   Penicillins Itching    Remote reaction, no anaphylaxis or severe  cutaneous symptoms Tolerates Cefepime    Family History  Problem Relation Age of Onset   Alzheimer's disease Mother    Lung cancer Mother    COPD Father    Diabetes Maternal Uncle    Stroke Neg Hx    Colon cancer Neg Hx    Stomach cancer Neg Hx    Esophageal cancer Neg Hx   Reviewed on admission  Prior to Admission medications   Medication Sig Start Date End Date Taking? Authorizing Provider  acetaminophen (TYLENOL) 500 MG tablet Take 1,000 mg by mouth  every 8 (eight) hours as needed for mild pain or moderate pain.    [provider]  albuterol (VENTOLIN HFA) 108 (90 Base) MCG/ACT inhaler Inhale 2 puffs into the lungs every 6 (six) hours as needed for wheezing or shortness of breath. 09/17/20   [provider]  apixaban (ELIQUIS) 2.5 MG TABS tablet Take by mouth 2 (two) times daily.    [provider]  benzocaine (ORAJEL) 10 % mucosal gel Use as directed 1 application  in the mouth or throat every 6 (six) hours as needed (gum pain/irritation).    [provider]  colchicine 0.6 MG tablet Take 1 tablet (0.6 mg total) by mouth daily. 03/25/22   Almon Hercules, MD  cycloSPORINE (RESTASIS) 0.05 % ophthalmic emulsion 1 drop 2 (two) times daily.    [provider]  feeding supplement, ENSURE COMPLETE, (ENSURE COMPLETE) LIQD Take 237 mLs by mouth 2 (two) times daily between meals. 07/28/19   Myrlene Broker, MD  fentaNYL (DURAGESIC) 12 MCG/HR Place onto the skin every 3 (three) days.    [provider]  ferrous sulfate 325 (65 FE) MG tablet Take 1 tablet (325 mg total) by mouth daily with breakfast. 02/21/21 10/05/22  Jerald Kief, MD  folic acid (FOLVITE) 1 MG tablet Take 1 mg by mouth daily.    [provider]  glipiZIDE (GLUCOTROL XL) 2.5 MG 24 hr tablet Take 2.5 mg by mouth daily. 02/01/21   [provider]  guaifenesin (HUMIBID E) 400 MG TABS tablet Take 400 mg by mouth every 4 (four) hours.    [provider]  hydrocortisone 1 % lotion Apply 1 Application topically every 6 (six) hours as needed for itching (apply to right arm).    [provider]  hydroxypropyl methylcellulose / hypromellose (ISOPTO TEARS / GONIOVISC) 2.5 % ophthalmic solution Place 1 drop into both eyes 4 (four) times daily as needed for dry eyes.    [provider]  hydrOXYzine (ATARAX) 25 MG tablet Take 25 mg by mouth every 6 (six) hours as needed for itching. 12/25/21   [provider]  loperamide (IMODIUM A-D) 2 MG tablet Take 2 mg by mouth 2 (two) times daily.    [provider]  loratadine (CLARITIN) 10 MG tablet Take 10 mg by mouth daily.    [provider]  magnesium hydroxide (MILK OF MAGNESIA) 400 MG/5ML suspension Take 30 mLs by mouth daily as needed for mild constipation.    [provider]  methocarbamol (ROBAXIN) 500 MG tablet Take 1,000 mg by mouth every 8 (eight) hours as needed for muscle spasms. 04/12/17   [provider]  metoprolol tartrate (LOPRESSOR) 25 MG tablet Take 1 tablet (25 mg total) by mouth 2 (two) times daily. 02/20/21 10/05/22  Jerald Kief, MD  montelukast (SINGULAIR) 10 MG tablet Take 1 tablet (10 mg total) by mouth at bedtime. 07/28/19   Hillard Danker  A, MD  Multiple Vitamin (MULTIVITAMIN WITH MINERALS) TABS tablet Take 1 tablet by mouth daily. 03/26/20   Leatha Gilding, MD  Multiple Vitamins-Minerals (PRESERVISION AREDS 2 PO) Take by mouth.    [provider]  omeprazole (PRILOSEC) 40 MG capsule Take 40 mg by mouth daily.    [provider]  ondansetron (ZOFRAN) 4 MG tablet Take 4 mg by mouth every 8 (eight) hours as needed for nausea or vomiting.    [provider]  promethazine (PHENERGAN) 25 MG suppository Place 1 suppository (25 mg total) rectally every 6 (six) hours as needed for nausea or vomiting. 02/27/20   Myrlene Broker, MD  Propylene Glycol (SYSTANE COMPLETE OP) Apply 1 drop to eye 2 (two) times daily.    [provider]  sertraline (ZOLOFT) 25 MG tablet Take 25 mg by mouth daily. 02/08/21   [provider]  sodium chloride (OCEAN) 0.65 % SOLN nasal spray Place 1 spray into both nostrils every 2 (two) hours as needed for congestion (nasal dryness).    [provider]  sucralfate (CARAFATE) 1 g tablet Take 1 g by mouth 4 (four) times daily. Dissolve tablets 01/28/21   [provider]  thiamine 100 MG tablet Take 1  tablet (100 mg total) by mouth daily. 03/26/20   Leatha Gilding, MD  traMADol (ULTRAM) 50 MG tablet Take 50 mg by mouth every 8 (eight) hours as needed.    [provider]  trolamine salicylate (ASPERCREME) 10 % cream Apply 1 Application topically as needed for muscle pain.    [provider]    Physical Exam: Vitals:   05/22/23 1649 05/22/23 1651 05/22/23 1700 05/22/23 1730  BP:  (!) 147/79 133/74 113/66  Pulse:  (!) 48 (!) 44 (!) 44  Resp:  10 13 10   Temp:  97.6 F (36.4 C)    TempSrc:  Oral    SpO2:  100% 100% 100%  Weight: 100 kg     Height: 5\' 11"  (1.803 m)       Physical Exam Constitutional:      General: She is not in acute distress.    Appearance: Normal appearance.  HENT:     Head: Normocephalic and atraumatic.     Mouth/Throat:     Mouth: Mucous membranes are moist.     Pharynx: Oropharynx is clear.  Eyes:     Extraocular Movements: Extraocular movements intact.     Pupils: Pupils are equal, round, and reactive to light.  Cardiovascular:     Rate and Rhythm: Regular rhythm. Bradycardia present.     Pulses: Normal pulses.     Heart sounds: Normal heart sounds.  Pulmonary:     Effort: Pulmonary effort is normal. No respiratory distress.     Breath sounds: Normal breath sounds.  Abdominal:     General: Bowel sounds are normal. There is no distension.     Palpations: Abdomen is soft.     Tenderness: There is no abdominal tenderness.  Musculoskeletal:        General: No swelling or deformity.     Right lower leg: Edema present.     Left lower leg: Edema present.  Skin:    General: Skin is warm and dry.  Neurological:     General: No focal deficit present.     Mental Status: Mental status is at baseline.    Labs on Admission: I have personally reviewed following labs and imaging studies  CBC: Recent Labs  Lab  05/22/23 1700  WBC 4.2  NEUTROABS 2.4  HGB 11.6*  HCT 36.8  MCV 87.8  PLT 249    Basic Metabolic Panel: Recent Labs   Lab 05/22/23 1700  NA 148*  K 4.9  CL 111  CO2 26  GLUCOSE 61*  BUN 18  CREATININE 1.11*  CALCIUM 10.1    GFR: Estimated Creatinine Clearance: 56.2 mL/min (A) (by C-G formula based on SCr of 1.11 mg/dL (H)).  Liver Function Tests: Recent Labs  Lab 05/22/23 1700  AST 27  ALT 21  ALKPHOS 73  BILITOT 0.3  PROT 7.0  ALBUMIN 3.4*    Urine analysis:    Component Value Date/Time   COLORURINE YELLOW 03/18/2022 2225   APPEARANCEUR HAZY (A) 03/18/2022 2225   LABSPEC 1.016 03/18/2022 2225   PHURINE 6.0 03/18/2022 2225   GLUCOSEU NEGATIVE 03/18/2022 2225   GLUCOSEU NEGATIVE 02/02/2020 1220   HGBUR NEGATIVE 03/18/2022 2225   BILIRUBINUR NEGATIVE 03/18/2022 2225   BILIRUBINUR Neg 10/22/2017 0950   KETONESUR NEGATIVE 03/18/2022 2225   PROTEINUR NEGATIVE 03/18/2022 2225   UROBILINOGEN 0.2 02/02/2020 1220   NITRITE POSITIVE (A) 03/18/2022 2225   LEUKOCYTESUR NEGATIVE 03/18/2022 2225    Radiological Exams on Admission: No results found.  EKG: Independently reviewed.  Sinus bradycardia at 48 bpm.  Nonspecific T wave flattening multiple leads.  Minimal baseline wander and baseline artifact.  Assessment/Plan Principal Problem:   GI bleed Active Problems:   Morbid obesity (HCC)   HTN (hypertension)   GERD (gastroesophageal reflux disease)   DM type 2 (diabetes mellitus, type 2) (HCC)   Paroxysmal SVT (supraventricular tachycardia) (HCC)   Anemia   Lymphedema   CKD (chronic kidney disease), stage III (HCC)   Cervical stenosis of spinal canal   GI bleed > Patient presenting with 1 month of abdominal pain in her epigastrium after eating.  1 week of black tarry stools.  Hemoglobin stable 11.6.  FOBT positive.  IV PPI given in the ED.  GI consulted. - Monitoring telemetry overnight - Appreciate GI recommendations and assistance - Continue IV PPI - Full liquid diet - Hold Eliquis - Trend CBC - Follow-up ED type and screen - Supportive care  Hypertension - Holding  metoprolol in the setting of bradycardia  Diabetes - SSI  GERD - PPI as above  Anemia > Hemoglobin currently 11.6 above - Trend CBC  Paroxysmal-SVT > Currently bradycardic. - Holding metoprolol  History of DVT > Per records and with patient she has been on Eliquis since the beginning of 2024.  Due to DVT. - Holding Eliquis in the setting of bleed as above  Depression - Continue bupropion  CKD 3 > Creatinine stable 1.11 - Trend renal function and electrolytes   Obesity - Noted  Bed bound Lymphedema C-spine stenosis > Has not walked in years - Noted  DVT prophylaxis: SCDs Code Status:   Full Family Communication:  None on admission  Disposition Plan:   Patient is from:  [employer] DC to:  Same as above  Anticipated DC date:  1 to 3 days  Anticipated DC barriers: None  Consults called:  Gastroenterology Admission status:  Observation, telemetry  Severity of Illness: The appropriate patient status for this patient is OBSERVATION. Observation status is judged to be reasonable and necessary in order to provide the required intensity of service to ensure the patient's safety. The patient's presenting symptoms, physical exam findings, and initial radiographic and laboratory data in the context of their medical condition is  felt to place them at decreased risk for further clinical deterioration. Furthermore, it is anticipated that the patient will be medically stable for discharge from the hospital within 2 midnights of admission.    Synetta Fail MD [facility]  How to contact the [facility] Attending or Consulting provider 7A - 7P or covering provider during after hours 7P -7A, for this patient?   Check the care team in [facility] and look for a) attending/consulting [facility] provider listed and b) the [facility] team listed Log into [email or web address] and use [facility]'s universal password to access. If you do not have the password, please contact the hospital  operator. Locate the [facility] provider you are looking for under [facility] and page to a number that you can be directly reached. If you still have difficulty reaching the provider, please page the [facility] (Director on Call) for the Hospitalists listed on amion for assistance.  05/22/2023, 6:22 PM

## 2023-05-22 NOTE — ED Provider Notes (Signed)
[place] EMERGENCY DEPARTMENT AT [facility] Provider Note   CSN: [id number] Arrival date & time: 05/22/23  1637     History  Chief Complaint  Patient presents with   GI Bleeding   Abdominal Pain    [name] [name] is a 78 y.o. female history of peptic ulcer, CHF, mild aortic stenosis, hypertension, on Eliquis presented for 1 week of black tarry stools.  Patient denies history of GI bleeds.  Patient that she has been nauseous without vomiting.  Patient does not take any ibuprofen.  Patient does endorse generalized weakness.  Patient does endorse epigastric discomfort when eating however is otherwise asymptomatic.   Home Medications Prior to Admission medications   Medication Sig Start Date End Date Taking? Authorizing Provider  acetaminophen (TYLENOL) 500 MG tablet Take 1,000 mg by mouth every 8 (eight) hours as needed for mild pain or moderate pain.    [provider]  albuterol (VENTOLIN HFA) 108 (90 Base) MCG/ACT inhaler Inhale 2 puffs into the lungs every 6 (six) hours as needed for wheezing or shortness of breath. 09/17/20   [provider]  apixaban (ELIQUIS) 2.5 MG TABS tablet Take by mouth 2 (two) times daily.    [provider]  benzocaine (ORAJEL) 10 % mucosal gel Use as directed 1 application  in the mouth or throat every 6 (six) hours as needed (gum pain/irritation).    [provider]  colchicine 0.6 MG tablet Take 1 tablet (0.6 mg total) by mouth daily. 03/25/22   Almon Hercules, MD  cycloSPORINE (RESTASIS) 0.05 % ophthalmic emulsion 1 drop 2 (two) times daily.    [provider]  feeding supplement, ENSURE COMPLETE, (ENSURE COMPLETE) LIQD Take 237 mLs by mouth 2 (two) times daily between meals. 07/28/19   Myrlene Broker, MD  fentaNYL (DURAGESIC) 12 MCG/HR Place onto the skin every 3 (three) days.    [provider]  ferrous sulfate 325 (65 FE) MG tablet Take 1 tablet (325 mg total) by mouth daily with  breakfast. 02/21/21 10/05/22  Jerald Kief, MD  folic acid (FOLVITE) 1 MG tablet Take 1 mg by mouth daily.    [provider]  glipiZIDE (GLUCOTROL XL) 2.5 MG 24 hr tablet Take 2.5 mg by mouth daily. 02/01/21   [provider]  guaifenesin (HUMIBID E) 400 MG TABS tablet Take 400 mg by mouth every 4 (four) hours.    [provider]  hydrocortisone 1 % lotion Apply 1 Application topically every 6 (six) hours as needed for itching (apply to right arm).    [provider]  hydroxypropyl methylcellulose / hypromellose (ISOPTO TEARS / GONIOVISC) 2.5 % ophthalmic solution Place 1 drop into both eyes 4 (four) times daily as needed for dry eyes.    [provider]  hydrOXYzine (ATARAX) 25 MG tablet Take 25 mg by mouth every 6 (six) hours as needed for itching. 12/25/21   [provider]  loperamide (IMODIUM A-D) 2 MG tablet Take 2 mg by mouth 2 (two) times daily.    [provider]  loratadine (CLARITIN) 10 MG tablet Take 10 mg by mouth daily.    [provider]  magnesium hydroxide (MILK OF MAGNESIA) 400 MG/5ML suspension Take 30 mLs by mouth daily as needed for mild constipation.    [provider]  methocarbamol (ROBAXIN) 500 MG tablet Take 1,000 mg by mouth every 8 (eight) hours as needed for muscle spasms. 04/12/17   [provider]  metoprolol tartrate (  LOPRESSOR) 25 MG tablet Take 1 tablet (25 mg total) by mouth 2 (two) times daily. 02/20/21 10/05/22  Jerald Kief, MD  montelukast (SINGULAIR) 10 MG tablet Take 1 tablet (10 mg total) by mouth at bedtime. 07/28/19   Myrlene Broker, MD  Multiple Vitamin (MULTIVITAMIN WITH MINERALS) TABS tablet Take 1 tablet by mouth daily. 03/26/20   Leatha Gilding, MD  Multiple Vitamins-Minerals (PRESERVISION AREDS 2 PO) Take by mouth.    [provider]  omeprazole (PRILOSEC) 40 MG capsule Take 40 mg by mouth daily.    [provider]  ondansetron (ZOFRAN)  4 MG tablet Take 4 mg by mouth every 8 (eight) hours as needed for nausea or vomiting.    [provider]  promethazine (PHENERGAN) 25 MG suppository Place 1 suppository (25 mg total) rectally every 6 (six) hours as needed for nausea or vomiting. 02/27/20   Myrlene Broker, MD  Propylene Glycol (SYSTANE COMPLETE OP) Apply 1 drop to eye 2 (two) times daily.    [provider]  sertraline (ZOLOFT) 25 MG tablet Take 25 mg by mouth daily. 02/08/21   [provider]  sodium chloride (OCEAN) 0.65 % SOLN nasal spray Place 1 spray into both nostrils every 2 (two) hours as needed for congestion (nasal dryness).    [provider]  sucralfate (CARAFATE) 1 g tablet Take 1 g by mouth 4 (four) times daily. Dissolve tablets 01/28/21   [provider]  thiamine 100 MG tablet Take 1 tablet (100 mg total) by mouth daily. 03/26/20   Leatha Gilding, MD  traMADol (ULTRAM) 50 MG tablet Take 50 mg by mouth every 8 (eight) hours as needed.    [provider]  trolamine salicylate (ASPERCREME) 10 % cream Apply 1 Application topically as needed for muscle pain.    [provider]      Allergies    Oxycodone hcl and Penicillins    Review of Systems   Review of Systems  Gastrointestinal:  Positive for abdominal pain.    Physical Exam Updated Vital Signs BP (!) 147/79   Pulse (!) 48   Temp 97.6 F (36.4 C) (Oral)   Resp 10   Ht 5\' 11"  (1.803 m)   Wt 100 kg   SpO2 100%   BMI 30.75 kg/m  Physical Exam Vitals reviewed.  Constitutional:      General: She is not in acute distress. HENT:     Head: Normocephalic and atraumatic.  Eyes:     Extraocular Movements: Extraocular movements intact.     Conjunctiva/sclera: Conjunctivae normal.     Pupils: Pupils are equal, round, and reactive to light.  Cardiovascular:     Rate and Rhythm: Regular rhythm. Bradycardia present.     Pulses: Normal pulses.     Heart sounds: Normal heart sounds.      Comments: 2+ bilateral radial/dorsalis pedis pulses with regular rate Pulmonary:     Effort: Pulmonary effort is normal. No respiratory distress.     Breath sounds: Normal breath sounds.  Abdominal:     Palpations: Abdomen is soft.     Tenderness: There is no abdominal tenderness. There is no guarding or rebound. Negative signs include Murphy's sign, Rovsing's sign, McBurney's sign and psoas sign.  Genitourinary:    Comments: Chaperone: Banks, Everette Rank, RN Obvious melena on exam Rectal tone intact No other signs of rectal abnormalities Musculoskeletal:        General: Normal range of motion.  Cervical back: Normal range of motion and neck supple.     Comments: 5 out of 5 bilateral grip/leg extension strength  Skin:    General: Skin is warm and dry.     Capillary Refill: Capillary refill takes less than 2 seconds.  Neurological:     General: No focal deficit present.     Mental Status: She is alert and oriented to person, place, and time.     Comments: Sensation intact in all 4 limbs  Psychiatric:        Mood and Affect: Mood normal.     ED Results / Procedures / Treatments   Labs (all labs ordered are listed, but only abnormal results are displayed) Labs Reviewed  POC OCCULT BLOOD, ED - Abnormal; Notable for the following components:      Result Value   Fecal Occult Bld POSITIVE (*)    All other components within normal limits  CBC WITH DIFFERENTIAL/PLATELET  COMPREHENSIVE METABOLIC PANEL  PROTIME-INR  TYPE AND SCREEN    EKG None  Radiology No results found.  Procedures Procedures    Medications Ordered in ED Medications  pantoprazole (PROTONIX) injection 40 mg (has no administration in time range)  sodium chloride 0.9 % bolus 1,000 mL (1,000 mLs Intravenous New Bag/Given 05/22/23 1711)    ED Course/ Medical Decision Making/ A&P                                 Medical Decision Making Amount and/or Complexity of Data Reviewed Labs:  ordered.  Risk Prescription drug management. Decision regarding hospitalization.   Jourden Fifita 78 y.o. presented today for GIB. Working DDx that I considered at this time includes, but not limited to, Esophagitis, Mallory Weiss/Boerhaave, Variceal bleeding, PUD/gastritis/ulcers, diverticular bleed, colon cancer, rectal bleed, internal/external hemorrhoids  R/o DDx: Esophagitis, Mallory Weiss/Boerhaave, Variceal bleeding, diverticular bleed, colon cancer, rectal bleed, internal/external hemorrhoids: These are considered less likely due to history of present illness, physical exam, labs/imaging findings  Review of prior external notes: 03/18/2022 Discharge  Unique Tests and My Independent Interpretation:  CBC: Unremarkable CMP: Unremarkable PT/INR: Unremarkable Type and screen: B+ Fecal occult: Positive  Social Determinants of Health: Nursing home  Discussion with Independent Historian: None  Discussion of Management of Tests:  Elnoria Howard, MD Bayard Hugger, MD Hospitalist  Risk: High: hospitalization or escalation of hospital-level care  Risk Stratification Score: none  Staffed with Silverio Lay, MD  Plan: On exam patient was no acute distress but mildly bradycardic. Physical exam showed obvious melena with positive occult test with a chaperone present.  Labs to be ordered and I spoke to the GI on-call and they will see the patient after admission.  I spoke to the hospitalist and patient was accepted for admission.  Patient's hemoglobin is stable at this time and so will not transfuse.  Patient stable to be admitted.  This chart was dictated using voice recognition software.  Despite best efforts to proofread,  errors can occur which can change the documentation meaning.        Final Clinical Impression(s) / ED Diagnoses Final diagnoses:  Upper GI bleed    Rx / DC Orders ED Discharge Orders     None         Remi Deter 05/22/23 1833    Charlynne Pander,  MD 05/22/23 2055

## 2023-05-23 DIAGNOSIS — I4719 Other supraventricular tachycardia: Secondary | ICD-10-CM | POA: Diagnosis present

## 2023-05-23 DIAGNOSIS — I130 Hypertensive heart and chronic kidney disease with heart failure and stage 1 through stage 4 chronic kidney disease, or unspecified chronic kidney disease: Secondary | ICD-10-CM | POA: Diagnosis present

## 2023-05-23 DIAGNOSIS — D649 Anemia, unspecified: Secondary | ICD-10-CM | POA: Diagnosis not present

## 2023-05-23 DIAGNOSIS — E669 Obesity, unspecified: Secondary | ICD-10-CM | POA: Diagnosis present

## 2023-05-23 DIAGNOSIS — E11649 Type 2 diabetes mellitus with hypoglycemia without coma: Secondary | ICD-10-CM | POA: Diagnosis present

## 2023-05-23 DIAGNOSIS — K921 Melena: Secondary | ICD-10-CM

## 2023-05-23 DIAGNOSIS — M4802 Spinal stenosis, cervical region: Secondary | ICD-10-CM | POA: Diagnosis not present

## 2023-05-23 DIAGNOSIS — K2970 Gastritis, unspecified, without bleeding: Secondary | ICD-10-CM | POA: Diagnosis not present

## 2023-05-23 DIAGNOSIS — Z7984 Long term (current) use of oral hypoglycemic drugs: Secondary | ICD-10-CM | POA: Diagnosis not present

## 2023-05-23 DIAGNOSIS — R001 Bradycardia, unspecified: Secondary | ICD-10-CM | POA: Diagnosis not present

## 2023-05-23 DIAGNOSIS — Z87891 Personal history of nicotine dependence: Secondary | ICD-10-CM | POA: Diagnosis not present

## 2023-05-23 DIAGNOSIS — K2981 Duodenitis with bleeding: Secondary | ICD-10-CM | POA: Diagnosis present

## 2023-05-23 DIAGNOSIS — I10 Essential (primary) hypertension: Secondary | ICD-10-CM | POA: Diagnosis not present

## 2023-05-23 DIAGNOSIS — K2971 Gastritis, unspecified, with bleeding: Secondary | ICD-10-CM | POA: Diagnosis present

## 2023-05-23 DIAGNOSIS — B3781 Candidal esophagitis: Secondary | ICD-10-CM | POA: Diagnosis present

## 2023-05-23 DIAGNOSIS — Z833 Family history of diabetes mellitus: Secondary | ICD-10-CM | POA: Diagnosis not present

## 2023-05-23 DIAGNOSIS — K2289 Other specified disease of esophagus: Secondary | ICD-10-CM | POA: Diagnosis not present

## 2023-05-23 DIAGNOSIS — I441 Atrioventricular block, second degree: Secondary | ICD-10-CM | POA: Diagnosis present

## 2023-05-23 DIAGNOSIS — E1165 Type 2 diabetes mellitus with hyperglycemia: Secondary | ICD-10-CM | POA: Diagnosis present

## 2023-05-23 DIAGNOSIS — I509 Heart failure, unspecified: Secondary | ICD-10-CM | POA: Diagnosis present

## 2023-05-23 DIAGNOSIS — K2990 Gastroduodenitis, unspecified, without bleeding: Secondary | ICD-10-CM | POA: Diagnosis not present

## 2023-05-23 DIAGNOSIS — Z6830 Body mass index (BMI) 30.0-30.9, adult: Secondary | ICD-10-CM | POA: Diagnosis not present

## 2023-05-23 DIAGNOSIS — Z7901 Long term (current) use of anticoagulants: Secondary | ICD-10-CM | POA: Diagnosis not present

## 2023-05-23 DIAGNOSIS — N1831 Chronic kidney disease, stage 3a: Secondary | ICD-10-CM | POA: Diagnosis present

## 2023-05-23 DIAGNOSIS — K219 Gastro-esophageal reflux disease without esophagitis: Secondary | ICD-10-CM | POA: Diagnosis present

## 2023-05-23 DIAGNOSIS — Z801 Family history of malignant neoplasm of trachea, bronchus and lung: Secondary | ICD-10-CM | POA: Diagnosis not present

## 2023-05-23 DIAGNOSIS — T68XXXA Hypothermia, initial encounter: Secondary | ICD-10-CM

## 2023-05-23 DIAGNOSIS — I110 Hypertensive heart disease with heart failure: Secondary | ICD-10-CM | POA: Diagnosis not present

## 2023-05-23 DIAGNOSIS — K922 Gastrointestinal hemorrhage, unspecified: Secondary | ICD-10-CM | POA: Diagnosis present

## 2023-05-23 DIAGNOSIS — D631 Anemia in chronic kidney disease: Secondary | ICD-10-CM | POA: Diagnosis present

## 2023-05-23 DIAGNOSIS — K2980 Duodenitis without bleeding: Secondary | ICD-10-CM | POA: Diagnosis not present

## 2023-05-23 DIAGNOSIS — F32A Depression, unspecified: Secondary | ICD-10-CM | POA: Diagnosis present

## 2023-05-23 DIAGNOSIS — E1122 Type 2 diabetes mellitus with diabetic chronic kidney disease: Secondary | ICD-10-CM | POA: Diagnosis present

## 2023-05-23 DIAGNOSIS — E870 Hyperosmolality and hypernatremia: Secondary | ICD-10-CM | POA: Diagnosis present

## 2023-05-23 LAB — COMPREHENSIVE METABOLIC PANEL
ALT: 17 U/L (ref 0–44)
AST: 24 U/L (ref 15–41)
Albumin: 2.8 g/dL — ABNORMAL LOW (ref 3.5–5.0)
Alkaline Phosphatase: 55 U/L (ref 38–126)
Anion gap: 9 (ref 5–15)
BUN: 16 mg/dL (ref 8–23)
CO2: 26 mmol/L (ref 22–32)
Calcium: 9.5 mg/dL (ref 8.9–10.3)
Chloride: 112 mmol/L — ABNORMAL HIGH (ref 98–111)
Creatinine, Ser: 1 mg/dL (ref 0.44–1.00)
GFR, Estimated: 58 mL/min — ABNORMAL LOW (ref >60)
Glucose, Bld: 60 mg/dL — ABNORMAL LOW (ref 70–99)
Potassium: 4.2 mmol/L (ref 3.5–5.1)
Sodium: 147 mmol/L — ABNORMAL HIGH (ref 135–145)
Total Bilirubin: 0.4 mg/dL (ref 0.0–1.2)
Total Protein: 6 g/dL — ABNORMAL LOW (ref 6.5–8.1)

## 2023-05-23 LAB — CBC
HCT: 31.8 % — ABNORMAL LOW (ref 36.0–46.0)
Hemoglobin: 10 g/dL — ABNORMAL LOW (ref 12.0–15.0)
MCH: 27.1 pg (ref 26.0–34.0)
MCHC: 31.4 g/dL (ref 30.0–36.0)
MCV: 86.2 fL (ref 80.0–100.0)
Platelets: 211 10*3/uL (ref 150–400)
RBC: 3.69 MIL/uL — ABNORMAL LOW (ref 3.87–5.11)
RDW: 17.7 % — ABNORMAL HIGH (ref 11.5–15.5)
WBC: 3.2 10*3/uL — ABNORMAL LOW (ref 4.0–10.5)
nRBC: 0 % (ref 0.0–0.2)

## 2023-05-23 LAB — GLUCOSE, CAPILLARY
Glucose-Capillary: 57 mg/dL — ABNORMAL LOW (ref 70–99)
Glucose-Capillary: 58 mg/dL — ABNORMAL LOW (ref 70–99)
Glucose-Capillary: 151 mg/dL — ABNORMAL HIGH (ref 70–99)
Glucose-Capillary: 56 mg/dL — ABNORMAL LOW (ref 70–99)
Glucose-Capillary: 150 mg/dL — ABNORMAL HIGH (ref 70–99)
Glucose-Capillary: 50 mg/dL — ABNORMAL LOW (ref 70–99)
Glucose-Capillary: 73 mg/dL (ref 70–99)
Glucose-Capillary: 108 mg/dL — ABNORMAL HIGH (ref 70–99)
Glucose-Capillary: 146 mg/dL — ABNORMAL HIGH (ref 70–99)

## 2023-05-23 MED ORDER — GLUCOSE 40 % PO GEL: 1.0000 | ORAL | Status: DC

## 2023-05-23 MED ORDER — DEXTROSE 50 % IV SOLN
25.0000 mL | Freq: Once | INTRAVENOUS | Status: AC
  Administered 2023-05-23: 25 mL via INTRAVENOUS
  Filled 2023-05-23: qty 50

## 2023-05-23 MED ORDER — DEXTROSE 50 % IV SOLN
50.0000 mL | Freq: Once | INTRAVENOUS | Status: AC
  Administered 2023-05-23: 50 mL via INTRAVENOUS
  Filled 2023-05-23: qty 50

## 2023-05-23 MED ORDER — DEXTROSE 10 % IV SOLN: INTRAVENOUS | Status: DC

## 2023-05-23 NOTE — Progress Notes (Signed)
PROGRESS NOTE    [name] [name]  ZOX:[id number] DOB: 01/22/1946 DOA: 05/22/2023 PCP: Pcp, No   Brief Narrative: [name] [name] is a 78 y.o. female with a history of diabetes mellitus type 2, hypertension, GERD, anemia, paroxysmal SVT, CKD stage III, obesity, lymphedema, incontinence, wernicke's aphasia, c-spine stenosis.  Patient presented secondary to black stools concerning for melena from upper GI bleeding. GI consulted. Hospitalization complicated by hypoglycemia with hypothermia and bradycardia.   Assessment and Plan:  Black stools Concern for melena from upper GI bleeding. FOBT positive. GI consulted and plan upper endoscopy on 2/17. -Trend CBC -GI recommendations: Upper endoscopy for 2/17 -Full liquid diet; NPO after midnight  Hypothermia Unclear etiology. Temperature down to a low of 94.6 F requiring application of a warming blanket. Now resolved.  Diabetes mellitus Currently uncontrolled with hypoglycemia. Patient is managed on glipizide 24 hour tablet as an outpatient. -Start D10 IV fluids -Check CBG q4 hours -Hold SSI -Discontinue glipizide on discharge  Sinus bradycardia Possibly related to hypoglycemia. Episodes of bradycardia with short pauses noted on telemetry. Patient is on metoprolol as an outpatient which was held on admission. -Treat hypoglycemia  Primary hypertension Patient is on metoprolol tartrate as an outpatient. Blood pressure is low-normal. Metoprolol held on admission.  Hypernatremia Secondary to free water deficit. -Continue IV fluids -BMP in AM  Chronic anemia Baseline hemoglobin of 10-11. Hemoglobin is stable.  Paroxysmal SVT Patient is on metoprolol tartrate as an outpatient which is held secondary to bradycardia.  History of DVT Patient is on Eliquis as an outpatient which is held on admission secondary to GI bleeding.  CKD stage IIIa Stable.  Lymphedema Noted. Patient with Unna boots on. -Wound care consult  C-spine  stenosis Bedbound Per review, patient has not walked in several years. Currently resides at a SNF.   Obesity Borderline. Estimated body mass index is 30.75 kg/m as calculated from the following:   Height as of this encounter: 5\' 11"  (1.803 m).   Weight as of this encounter: 100 kg.    DVT prophylaxis: SCDs Code Status:   Code Status: Full Code Family Communication: None at bedside Disposition Plan: Discharge back to facility pending ongoing workup/specialist management   Consultants:  Gastroenterology  Procedures:  None  Antimicrobials: None    Subjective: Patient reports no concerns at this time. Feels well.  Objective: BP 109/61 (BP Location: Right Arm)   Pulse (!) 41   Temp (!) 97.4 F (36.3 C) (Oral)   Resp 16   Ht 5\' 11"  (1.803 m)   Wt 100 kg   SpO2 98%   BMI 30.75 kg/m   Examination:  General exam: Appears calm and comfortable Respiratory system: Clear to auscultation. Respiratory effort normal. Cardiovascular system: S1 & S2 heard, RRR. Gastrointestinal system: Abdomen is nondistended, soft and nontender. Normal bowel sounds heard. Central nervous system: Alert and oriented. No focal neurological deficits. Musculoskeletal: No edema. No calf tenderness Skin: No cyanosis. No rashes Psychiatry: Judgement and insight appear normal. Mood & affect appropriate.    Data Reviewed: I have personally reviewed following labs and imaging studies  CBC Lab Results  Component Value Date   WBC 4.8 05/22/2023   RBC 3.91 05/22/2023   HGB 10.6 (L) 05/22/2023   HCT 34.1 (L) 05/22/2023   MCV 87.2 05/22/2023   MCH 27.1 05/22/2023   PLT 238 05/22/2023   MCHC 31.1 05/22/2023   RDW 17.6 (H) 05/22/2023   LYMPHSABS 1.4 05/22/2023   MONOABS 0.3 05/22/2023   EOSABS 0.1 05/22/2023  BASOSABS 0.0 05/22/2023     Last metabolic panel Lab Results  Component Value Date   NA 148 (H) 05/22/2023   K 4.9 05/22/2023   CL 111 05/22/2023   CO2 26 05/22/2023   BUN 18  05/22/2023   CREATININE 1.11 (H) 05/22/2023   GLUCOSE 61 (L) 05/22/2023   GFRNONAA 52 (L) 05/22/2023   GFRAA 41 (L) 01/09/2019   CALCIUM 10.1 05/22/2023   PHOS 3.7 03/19/2022   PROT 7.0 05/22/2023   ALBUMIN 3.4 (L) 05/22/2023   BILITOT 0.3 05/22/2023   ALKPHOS 73 05/22/2023   AST 27 05/22/2023   ALT 21 05/22/2023   ANIONGAP 11 05/22/2023    GFR: Estimated Creatinine Clearance: 56.2 mL/min (A) (by C-G formula based on SCr of 1.11 mg/dL (H)).  No results found for this or any previous visit (from the past 240 hours).    Radiology Studies: No results found.    LOS: 0 days    Jacquelin Hawking, MD [facility] 05/23/2023, 8:05 AM   If 7PM-7AM, please contact night-coverage [email or web address]

## 2023-05-23 NOTE — Consult Note (Signed)
This is outside the scope of practice for the [facility]. We do not not have lymphedema services for manual massage and compression wraps inpatient.   No topical care needed for skin changes related to lymphedema.    Lymphedema  Resources (updated 02/2021) Each site requires a referral from your primary care MD [facility] [address] [place], [place]  [phone] (Upper extremities)  [address] [place], [place] [phone] (Lower extremities, PATIENT CAN NOT HAVE A WOUND)  [facility] [address] [place], [place] [address] [phone]  [facility] [address]  [place], [place] [phone]  [facility] [address] [place], [place] [address] [phone]  [facility]  (only treatment for lymphedema related to cancer diagnosis) [address]  [place], [place] [address] [phone]    [facility] [address] [place], [place] [address] [phone] [facility] (formerly [facility]) [phone] [address] [place], [place] [address] [phone]    Re consult if needed, will not follow at this time. Thanks  Dyani [employer], RN,CWOCN, CNS, CWON-AP [phone])

## 2023-05-23 NOTE — Progress Notes (Signed)
Patients temp has been low recal was 94.6 rectal, recheck 96.2 Axillary, and then 97.3. Applied warm blankets, and warmed room. Kpad ordered but it was never brought up so heating packs were used. Patient has also had 2 hypoglycemic episodes. Oral intake unsuccessful. Dextrose given. Since being on tele pts HR has been low dropping down as low as mid 20s at times & having pauses. Patient asymptomatic, night provider aware. Rapid response to check on patient. EKG done and in chart. Has been alert and orient and able to be aroused, but drowsy.    Hypoglycemic Event  CBG: 58  Treatment: 8 oz juice/soda and D50 25 mL (12.5 gm)  Symptoms: None  Follow-up CBG: Time:0327 CBG Result: 57/ 151  Possible Reasons for Event: Inadequate meal intake and Unknown  Comments/MD notified: Hypoglygemic protocol. Chinita Greenland)    Hypoglycemic Event  CBG: 56  Treatment: 4 oz juice/soda and D50 50 mL (25 gm) Patient drowsy while drinking juice per tech. Dextrose given   Symptoms: None  Follow-up CBG: Time:0621 CBG Result:56/150  Possible Reasons for Event: Inadequate meal intake and Unknown  Comments/MD notified:MD Nettey

## 2023-05-23 NOTE — Consult Note (Signed)
Reason for Consult: Melena Referring Physician: [facility]  [name] [name] HPI: This is a 78 year old female with a PMH of an LA Grade C esophagitis and duodenal ulcers, history of dysphagia complaints s/p 20 mm balloon dilation, CHF, HTN, Wernicke's aphagia, and aortic stenosis who takes Eliquis admitted for epigastric abdominal pain and melena.  She was transferred from her nursing facility after complaining about epigastric pain for 1 month and melena for the past week.  Work up in the ER showed that she was at her baseline anemia, but she was heme positive.  She had a normal routine colonoscopy on 07/31/2014.  Her HGB on admission yesterday was at 11.6 g/dL and it decreased with IV hydration.  She remained hemodynamically stable.  Her baseline anemia ranges in the 10-11 g/dL range.  Past Medical History:  Diagnosis Date   Acute kidney injury (HCC)    Aortic stenosis, mild 11/17/2013   Arthritis    "both knees, spine, back, fingers" (11/29/2013)   CHF (congestive heart failure) (HCC)    Edema    GERD (gastroesophageal reflux disease)    Heart murmur    History of diastolic dysfunction    Echo 07/2008 with diastolic dysfunction   HTN (hypertension)    Morbid obesity (HCC)    Multifocal pneumonia 03/25/2022   OA (osteoarthritis)    PAC (premature atrial contraction)    per prior Holter   Palpitations    Pneumonia    "as a child"   PVC's (premature ventricular contractions)    per prior Holter   SVT (supraventricular tachycardia) (HCC)    Tachycardia    noted at 07/28/11 visit. Started on beta blocker. Possible atrial flutter vs long PR tachycardia/AVNRT   Type II diabetes mellitus (HCC)     Past Surgical History:  Procedure Laterality Date   BIOPSY  03/23/2020   Procedure: BIOPSY;  Surgeon: Charna Elizabeth, MD;  Location: [facility] ENDOSCOPY;  Service: Endoscopy;;   CESAREAN SECTION  1985   CESAREAN SECTION  1985   ESOPHAGOGASTRODUODENOSCOPY (EGD) WITH PROPOFOL N/A 03/23/2020    Procedure: ESOPHAGOGASTRODUODENOSCOPY (EGD) WITH PROPOFOL;  Surgeon: Charna Elizabeth, MD;  Location: [facility] ENDOSCOPY;  Service: Endoscopy;  Laterality: N/A;   SUPRAVENTRICULAR TACHYCARDIA ABLATION  11/29/2013   SUPRAVENTRICULAR TACHYCARDIA ABLATION N/A 11/29/2013   Procedure: SUPRAVENTRICULAR TACHYCARDIA ABLATION;  Surgeon: Marinus Maw, MD;  Location: [facility] CATH LAB;  Service: Cardiovascular;  Laterality: N/A;   US ECHOCARDIOGRAPHY  07/25/2008   EF 55-60%   VAGINAL DELIVERY      Family History  Problem Relation Age of Onset   Alzheimer's disease Mother    Lung cancer Mother    COPD Father    Diabetes Maternal Uncle    Stroke Neg Hx    Colon cancer Neg Hx    Stomach cancer Neg Hx    Esophageal cancer Neg Hx     Social History:  reports that she quit smoking about 37 years ago. Her smoking use included cigarettes. She started smoking about 52 years ago. She has a 30 pack-year smoking history. She has never used smokeless tobacco. She reports that she does not drink alcohol and does not use drugs.  Allergies:  Allergies  Allergen Reactions   Roxicodone [Oxycodone] Nausea And Vomiting   Penicillins Itching and Other (See Comments)    Remote reaction, no anaphylaxis or severe cutaneous symptoms Tolerates Cefepime    Medications: Scheduled:  buPROPion  75 mg Oral TID   cycloSPORINE  1 drop Both Eyes BID  dextrose  1 Tube Oral STAT   feeding supplement  237 mL Oral BID BM   insulin aspart  0-9 Units Subcutaneous TID WC   pantoprazole (PROTONIX) IV  40 mg Intravenous Q12H   sodium chloride flush  3 mL Intravenous Q12H   Continuous:  dextrose 50 mL/hr at 05/23/23 1478    Results for orders placed or performed during the hospital encounter of 05/22/23 (from the past 24 hours)  CBC with Differential     Status: Abnormal   Collection Time: 05/22/23  5:00 PM  Result Value Ref Range   WBC 4.2 4.0 - 10.5 K/uL   RBC 4.19 3.87 - 5.11 MIL/uL   Hemoglobin 11.6 (L) 12.0 - 15.0 g/dL   HCT 29.5  62.1 - 30.8 %   MCV 87.8 80.0 - 100.0 fL   MCH 27.7 26.0 - 34.0 pg   MCHC 31.5 30.0 - 36.0 g/dL   RDW 65.7 (H) 84.6 - 96.2 %   Platelets 249 150 - 400 K/uL   nRBC 0.0 0.0 - 0.2 %   Neutrophils Relative % 57 %   Neutro Abs 2.4 1.7 - 7.7 K/uL   Lymphocytes Relative 34 %   Lymphs Abs 1.4 0.7 - 4.0 K/uL   Monocytes Relative 6 %   Monocytes Absolute 0.3 0.1 - 1.0 K/uL   Eosinophils Relative 2 %   Eosinophils Absolute 0.1 0.0 - 0.5 K/uL   Basophils Relative 1 %   Basophils Absolute 0.0 0.0 - 0.1 K/uL   Immature Granulocytes 0 %   Abs Immature Granulocytes 0.01 0.00 - 0.07 K/uL  Comprehensive metabolic panel     Status: Abnormal   Collection Time: 05/22/23  5:00 PM  Result Value Ref Range   Sodium 148 (H) 135 - 145 mmol/L   Potassium 4.9 3.5 - 5.1 mmol/L   Chloride 111 98 - 111 mmol/L   CO2 26 22 - 32 mmol/L   Glucose, Bld 61 (L) 70 - 99 mg/dL   BUN 18 8 - 23 mg/dL   Creatinine, Ser 9.52 (H) 0.44 - 1.00 mg/dL   Calcium 84.1 8.9 - 32.4 mg/dL   Total Protein 7.0 6.5 - 8.1 g/dL   Albumin 3.4 (L) 3.5 - 5.0 g/dL   AST 27 15 - 41 U/L   ALT 21 0 - 44 U/L   Alkaline Phosphatase 73 38 - 126 U/L   Total Bilirubin 0.3 0.0 - 1.2 mg/dL   GFR, Estimated 52 (L) >60 mL/min   Anion gap 11 5 - 15  Protime-INR     Status: None   Collection Time: 05/22/23  5:00 PM  Result Value Ref Range   Prothrombin Time 14.9 11.4 - 15.2 seconds   INR 1.2 0.8 - 1.2  Type and screen     Status: None   Collection Time: 05/22/23  5:00 PM  Result Value Ref Range   ABO/RH(D) B POS    Antibody Screen NEG    Sample Expiration      05/25/2023,2359 Performed at [facility], [address], [place], [place] [address]   POC occult blood, ED     Status: Abnormal   Collection Time: 05/22/23  5:22 PM  Result Value Ref Range   Fecal Occult Bld POSITIVE (A) NEGATIVE  CBC     Status: Abnormal   Collection Time: 05/22/23  9:20 PM  Result Value Ref Range   WBC 4.8 4.0 - 10.5 K/uL   RBC 3.91 3.87 - 5.11 MIL/uL  Hemoglobin 10.6 (L) 12.0 - 15.0 g/dL   HCT 78.2 (L) 95.6 - 21.3 %   MCV 87.2 80.0 - 100.0 fL   MCH 27.1 26.0 - 34.0 pg   MCHC 31.1 30.0 - 36.0 g/dL   RDW 08.6 (H) 57.8 - 46.9 %   Platelets 238 150 - 400 K/uL   nRBC 0.0 0.0 - 0.2 %  Glucose, capillary     Status: None   Collection Time: 05/22/23  9:51 PM  Result Value Ref Range   Glucose-Capillary 87 70 - 99 mg/dL  Glucose, capillary     Status: Abnormal   Collection Time: 05/23/23  3:05 AM  Result Value Ref Range   Glucose-Capillary 58 (L) 70 - 99 mg/dL   Comment 1 Notify RN   Glucose, capillary     Status: Abnormal   Collection Time: 05/23/23  3:27 AM  Result Value Ref Range   Glucose-Capillary 57 (L) 70 - 99 mg/dL   Comment 1 Notify RN   Glucose, capillary     Status: Abnormal   Collection Time: 05/23/23  4:03 AM  Result Value Ref Range   Glucose-Capillary 151 (H) 70 - 99 mg/dL  Glucose, capillary     Status: Abnormal   Collection Time: 05/23/23  6:21 AM  Result Value Ref Range   Glucose-Capillary 56 (L) 70 - 99 mg/dL   Comment 1 Notify RN   Glucose, capillary     Status: Abnormal   Collection Time: 05/23/23  6:38 AM  Result Value Ref Range   Glucose-Capillary 50 (L) 70 - 99 mg/dL   Comment 1 Notify RN   Glucose, capillary     Status: Abnormal   Collection Time: 05/23/23  7:12 AM  Result Value Ref Range   Glucose-Capillary 150 (H) 70 - 99 mg/dL     No results found.  ROS:  As stated above in the HPI otherwise negative.  Blood pressure 109/61, pulse (!) 41, temperature (!) 97.4 F (36.3 C), temperature source Oral, resp. rate 16, height 5\' 11"  (1.803 m), weight 100 kg, SpO2 98%.    PE: Gen: NAD, ? Sleepy mental state Lungs: CTA Bilaterally CV: RRR without M/G/R ABD: Soft, NTND, +BS Ext: No C/C/E  Assessment/Plan: 1) Anemia. 2) History of an LA Grade C esophagitis. 3) History of duodenal ulcers (H. Pylori negative).   The patient's baseline mental state is unknown.  There is a history of Wernicke's  encephalopathy in 2021 secondary to her chronic nausea and vomiting.  The encephalopathy was reversed with thiamine supplementation.  She was not able to provide any history or answer any questions at this time.  Regardless, with her current presentation an EGD is appropriate.  Plan: 1) EGD tomorrow with [facility]. 2) Continue PPI. 3) Monitor HGB and transfuse if necessary. 4) Hold anticoagulation.  Jamesrobert Ohanesian D 05/23/2023, 8:28 AM

## 2023-05-23 NOTE — Hospital Course (Signed)
[name] [name] is a 78 y.o. female with a history of diabetes mellitus type 2, hypertension, GERD, anemia, paroxysmal SVT, CKD stage III, obesity, lymphedema, incontinence, wernicke's aphasia, c-spine stenosis.  Patient presented secondary to black stools concerning for melena from upper GI bleeding. GI consulted. Hospitalization complicated by hypoglycemia with hypothermia and bradycardia.

## 2023-05-23 NOTE — Care Management Obs Status (Signed)
MEDICARE OBSERVATION STATUS NOTIFICATION   Patient Details  Name: [name] [name] MRN: [id number] Date of Birth: 1945-07-03   Medicare Observation Status Notification Given:  Yes    Ronny Bacon, RN 05/23/2023, 10:29 AM

## 2023-05-24 ENCOUNTER — Encounter (HOSPITAL_COMMUNITY)
Admission: EM | Disposition: A | Discharge status: Skilled Nursing Facility | Payer: Self-pay | Source: Skilled Nursing Facility | Attending: Family Medicine

## 2023-05-24 DIAGNOSIS — I10 Essential (primary) hypertension: Secondary | ICD-10-CM | POA: Diagnosis not present

## 2023-05-24 DIAGNOSIS — I441 Atrioventricular block, second degree: Secondary | ICD-10-CM | POA: Diagnosis not present

## 2023-05-24 DIAGNOSIS — R001 Bradycardia, unspecified: Secondary | ICD-10-CM | POA: Diagnosis not present

## 2023-05-24 DIAGNOSIS — K921 Melena: Secondary | ICD-10-CM | POA: Diagnosis not present

## 2023-05-24 DIAGNOSIS — M4802 Spinal stenosis, cervical region: Secondary | ICD-10-CM | POA: Diagnosis not present

## 2023-05-24 DIAGNOSIS — D649 Anemia, unspecified: Secondary | ICD-10-CM | POA: Diagnosis not present

## 2023-05-24 DIAGNOSIS — N1831 Chronic kidney disease, stage 3a: Secondary | ICD-10-CM | POA: Diagnosis not present

## 2023-05-24 LAB — CBC
HCT: 31.6 % — ABNORMAL LOW (ref 36.0–46.0)
Hemoglobin: 9.9 g/dL — ABNORMAL LOW (ref 12.0–15.0)
MCH: 27.5 pg (ref 26.0–34.0)
MCHC: 31.3 g/dL (ref 30.0–36.0)
MCV: 87.8 fL (ref 80.0–100.0)
Platelets: 183 10*3/uL (ref 150–400)
RBC: 3.6 MIL/uL — ABNORMAL LOW (ref 3.87–5.11)
RDW: 17.7 % — ABNORMAL HIGH (ref 11.5–15.5)
WBC: 3.4 10*3/uL — ABNORMAL LOW (ref 4.0–10.5)
nRBC: 0 % (ref 0.0–0.2)

## 2023-05-24 LAB — URINALYSIS, W/ REFLEX TO CULTURE (INFECTION SUSPECTED)
Bilirubin Urine: NEGATIVE
Glucose, UA: NEGATIVE mg/dL
Ketones, ur: NEGATIVE mg/dL
Nitrite: NEGATIVE
Protein, ur: NEGATIVE mg/dL
Specific Gravity, Urine: 1.005 (ref 1.005–1.030)
pH: 5 (ref 5.0–8.0)

## 2023-05-24 LAB — CORTISOL: Cortisol, Plasma: 5.7 ug/dL

## 2023-05-24 LAB — BASIC METABOLIC PANEL
Anion gap: 7 (ref 5–15)
BUN: 19 mg/dL (ref 8–23)
CO2: 25 mmol/L (ref 22–32)
Calcium: 9.3 mg/dL (ref 8.9–10.3)
Chloride: 110 mmol/L (ref 98–111)
Creatinine, Ser: 1.05 mg/dL — ABNORMAL HIGH (ref 0.44–1.00)
GFR, Estimated: 55 mL/min — ABNORMAL LOW (ref >60)
Glucose, Bld: 89 mg/dL (ref 70–99)
Potassium: 4.4 mmol/L (ref 3.5–5.1)
Sodium: 142 mmol/L (ref 135–145)

## 2023-05-24 LAB — TSH: TSH: 4.287 u[IU]/mL (ref 0.350–4.500)

## 2023-05-24 LAB — MAGNESIUM: Magnesium: 1.7 mg/dL (ref 1.7–2.4)

## 2023-05-24 LAB — GLUCOSE, CAPILLARY
Glucose-Capillary: 148 mg/dL — ABNORMAL HIGH (ref 70–99)
Glucose-Capillary: 77 mg/dL (ref 70–99)
Glucose-Capillary: 84 mg/dL (ref 70–99)
Glucose-Capillary: 90 mg/dL (ref 70–99)
Glucose-Capillary: 93 mg/dL (ref 70–99)
Glucose-Capillary: 96 mg/dL (ref 70–99)

## 2023-05-24 LAB — BETA-HYDROXYBUTYRIC ACID: Beta-Hydroxybutyric Acid: 0.07 mmol/L (ref 0.05–0.27)

## 2023-05-24 SURGERY — ESOPHAGOGASTRODUODENOSCOPY (EGD) WITH PROPOFOL
Anesthesia: Monitor Anesthesia Care

## 2023-05-24 MED ORDER — COSYNTROPIN 0.25 MG IJ SOLR
0.2500 mg | Freq: Once | INTRAMUSCULAR | Status: AC
  Administered 2023-05-25: 0.25 mg via INTRAVENOUS
  Filled 2023-05-24: qty 0.25

## 2023-05-24 MED ORDER — HYDROCODONE-ACETAMINOPHEN 5-325 MG PO TABS: 1.0000 | ORAL_TABLET | ORAL | Status: DC | PRN

## 2023-05-24 NOTE — H&P (View-Only) (Signed)
    Gastroenterology Inpatient Follow Up    Subjective: Patient is A&Ox3 during my interview with her today. Patient did not recall whether or not she had a stool. Per patient's nurse her last stool yesterday evening was brown. She has not had any stools today. Denies abdominal pain. She does have pain over her left ribs. Patient's school friend is at bedside.  Objective: Vital signs in last 24 hours: Temp:  [91.9 F (33.3 C)-98.2 F (36.8 C)] 97.7 F (36.5 C) (02/17 1444) Pulse Rate:  [34-65] 65 (02/17 1444) Resp:  [14-20] 20 (02/17 1444) BP: (93-129)/(51-110) 103/57 (02/17 1444) SpO2:  [97 %-100 %] 97 % (02/17 1444) Last BM Date : 05/23/23  Intake/Output from previous day: 02/16 0701 - 02/17 0700 In: 490.2 [I.V.:490.2] Out: 300 [Urine:300] Intake/Output this shift: Total I/O In: 1123.5 [I.V.:1123.5] Out: 600 [Urine:600]  General appearance: alert and cooperative Cardio: bradycardia GI: non-tender non-distended. Patient does describe some tenderness over her left rib Extremities: bilateral lower extremity lymphedema  Lab Results: Recent Labs    05/22/23 2120 05/23/23 0838 05/24/23 0539  WBC 4.8 3.2* 3.4*  HGB 10.6* 10.0* 9.9*  HCT 34.1* 31.8* 31.6*  PLT 238 211 183   BMET Recent Labs    05/22/23 1700 05/23/23 0838 05/24/23 0539  NA 148* 147* 142  K 4.9 4.2 4.4  CL 111 112* 110  CO2 26 26 25   GLUCOSE 61* 60* 89  BUN 18 16 19   CREATININE 1.11* 1.00 1.05*  CALCIUM 10.1 9.5 9.3   LFT Recent Labs    05/23/23 0838  PROT 6.0*  ALBUMIN 2.8*  AST 24  ALT 17  ALKPHOS 55  BILITOT 0.4   PT/INR Recent Labs    05/22/23 1700  LABPROT 14.9  INR 1.2   Hepatitis Panel No results for input(s): "HEPBSAG", "HCVAB", "HEPAIGM", "HEPBIGM" in the last 72 hours. C-Diff No results for input(s): "CDIFFTOX" in the last 72 hours.  Studies/Results: No results found.  Medications: I have reviewed the patient's current medications. Scheduled:  buPROPion  75 mg  Oral TID   [START ON 05/25/2023] cosyntropin  0.25 mg Intravenous Once   cycloSPORINE  1 drop Both Eyes BID   feeding supplement  237 mL Oral BID BM   pantoprazole (PROTONIX) IV  40 mg Intravenous Q12H   sodium chloride flush  3 mL Intravenous Q12H   Continuous:  dextrose 75 mL/hr at 05/24/23 1500   VHQ:IONGEXBMWUXLK **OR** acetaminophen, albuterol, HYDROcodone-acetaminophen, hydroxypropyl methylcellulose / hypromellose  Assessment/Plan: 78 year old female with history of LA Grade C esophagitis on EGD in 2021, duodenal ulcers, dysphagia s/p 20 mm balloon dilation, Wernick'e encephalopathy, CHF, and aortic stenosis presented with melena. Originally plan was for EGD today but patient was found to have significant hypothermia and bradycardia. As a result her EGD will be delayed until we are able to better sort out the etiology of hypothermia and bradycardia. Cortisol level was slightly low so cosyntropin stim test was ordered. Cardiology has been consulted. Patient has not had any additional episode of melena today and her last stool yesterday evening was brown per nursing. Hb has potentially downtrended slightly.  - Trend Hb - CLD for now - Cont PPI BID - Follow up cardiology recommendations - Can plan for EGD later on during this hospitalization   LOS: 1 day   Imogene Burn 05/24/2023, 3:06 PM

## 2023-05-24 NOTE — Progress Notes (Signed)
    Gastroenterology Inpatient Follow Up    Subjective: Patient is A&Ox3 during my interview with her today. Patient did not recall whether or not she had a stool. Per patient's nurse her last stool yesterday evening was brown. She has not had any stools today. Denies abdominal pain. She does have pain over her left ribs. Patient's school friend is at bedside.  Objective: Vital signs in last 24 hours: Temp:  [91.9 F (33.3 C)-98.2 F (36.8 C)] 97.7 F (36.5 C) (02/17 1444) Pulse Rate:  [34-65] 65 (02/17 1444) Resp:  [14-20] 20 (02/17 1444) BP: (93-129)/(51-110) 103/57 (02/17 1444) SpO2:  [97 %-100 %] 97 % (02/17 1444) Last BM Date : 05/23/23  Intake/Output from previous day: 02/16 0701 - 02/17 0700 In: 490.2 [I.V.:490.2] Out: 300 [Urine:300] Intake/Output this shift: Total I/O In: 1123.5 [I.V.:1123.5] Out: 600 [Urine:600]  General appearance: alert and cooperative Cardio: bradycardia GI: non-tender non-distended. Patient does describe some tenderness over her left rib Extremities: bilateral lower extremity lymphedema  Lab Results: Recent Labs    05/22/23 2120 05/23/23 0838 05/24/23 0539  WBC 4.8 3.2* 3.4*  HGB 10.6* 10.0* 9.9*  HCT 34.1* 31.8* 31.6*  PLT 238 211 183   BMET Recent Labs    05/22/23 1700 05/23/23 0838 05/24/23 0539  NA 148* 147* 142  K 4.9 4.2 4.4  CL 111 112* 110  CO2 26 26 25   GLUCOSE 61* 60* 89  BUN 18 16 19   CREATININE 1.11* 1.00 1.05*  CALCIUM 10.1 9.5 9.3   LFT Recent Labs    05/23/23 0838  PROT 6.0*  ALBUMIN 2.8*  AST 24  ALT 17  ALKPHOS 55  BILITOT 0.4   PT/INR Recent Labs    05/22/23 1700  LABPROT 14.9  INR 1.2   Hepatitis Panel No results for input(s): "HEPBSAG", "HCVAB", "HEPAIGM", "HEPBIGM" in the last 72 hours. C-Diff No results for input(s): "CDIFFTOX" in the last 72 hours.  Studies/Results: No results found.  Medications: I have reviewed the patient's current medications. Scheduled:  buPROPion  75 mg  Oral TID   [START ON 05/25/2023] cosyntropin  0.25 mg Intravenous Once   cycloSPORINE  1 drop Both Eyes BID   feeding supplement  237 mL Oral BID BM   pantoprazole (PROTONIX) IV  40 mg Intravenous Q12H   sodium chloride flush  3 mL Intravenous Q12H   Continuous:  dextrose 75 mL/hr at 05/24/23 1500   VHQ:IONGEXBMWUXLK **OR** acetaminophen, albuterol, HYDROcodone-acetaminophen, hydroxypropyl methylcellulose / hypromellose  Assessment/Plan: 78 year old female with history of LA Grade C esophagitis on EGD in 2021, duodenal ulcers, dysphagia s/p 20 mm balloon dilation, Wernick'e encephalopathy, CHF, and aortic stenosis presented with melena. Originally plan was for EGD today but patient was found to have significant hypothermia and bradycardia. As a result her EGD will be delayed until we are able to better sort out the etiology of hypothermia and bradycardia. Cortisol level was slightly low so cosyntropin stim test was ordered. Cardiology has been consulted. Patient has not had any additional episode of melena today and her last stool yesterday evening was brown per nursing. Hb has potentially downtrended slightly.  - Trend Hb - CLD for now - Cont PPI BID - Follow up cardiology recommendations - Can plan for EGD later on during this hospitalization   LOS: 1 day   Imogene Burn 05/24/2023, 3:06 PM

## 2023-05-24 NOTE — Progress Notes (Signed)
PROGRESS NOTE    [name] [name]  NGE:[id number] DOB: 1945/10/31 DOA: 05/22/2023 PCP: Pcp, No   Brief Narrative: [name] [name] is a 78 y.o. female with a history of diabetes mellitus type 2, hypertension, GERD, anemia, paroxysmal SVT, CKD stage III, obesity, lymphedema, incontinence, wernicke's aphasia, c-spine stenosis.  Patient presented secondary to black stools concerning for melena from upper GI bleeding. GI consulted. Hospitalization complicated by hypoglycemia with hypothermia and bradycardia.   Assessment and Plan:  Black stools Concern for melena from upper GI bleeding. FOBT positive. GI consulted and initial plan upper endoscopy on 2/17. -Trend CBC -GI recommendations: postponing upper endoscopy secondary to bradycardia and hypothermia  Hypothermia Unclear etiology. Temperature down to a low of 94.6 F requiring application of a warming blanket. Initially thought to have resolved, but now has returned with temperature down to 91.9 F this morning. No evidence of infectious source. -Check blood cultures -Check cortisol and TSH -Warming blanket  Diabetes mellitus Currently uncontrolled with hypoglycemia. Patient is managed on glipizide 24 hour tablet as an outpatient. -Continue D10 IV fluids; increase rate -Check CBG q4 hours -Hold SSI -Discontinue glipizide on discharge  Bradycardia 1st degree heart block Possibly related to hypoglycemia. Episodes of bradycardia with short pauses noted on telemetry. Patient is on metoprolol as an outpatient which was held on admission. Possible second degree type 2 noted on telemetry; will consult cardiology to verify and for management. -Treat hypothermia  Primary hypertension Patient is on metoprolol tartrate as an outpatient. Blood pressure is low-normal. Metoprolol held on admission.  Chronic anemia Hemoglobin baseline is around 10-11. Stable.  Hypernatremia Secondary to free water deficit. -Continue IV fluids -BMP in  AM  Chronic anemia Baseline hemoglobin of 10-11. Hemoglobin is stable.  Paroxysmal SVT Patient is on metoprolol tartrate as an outpatient which is held secondary to bradycardia.  History of DVT Patient is on Eliquis as an outpatient which is held on admission secondary to GI bleeding.  CKD stage IIIa Stable.  Lymphedema Noted. Patient with Unna boots on.  C-spine stenosis Bedbound Per review, patient has not walked in several years. Currently resides at a SNF.  Leg tremors Patient has a history of cervical stenosis with cervical radiculopathy and lumbar stenosis. Patient has been bedbound for several years. No tremors noted on exam, however it is possible this is secondary to general leg weakness.  Obesity Borderline. Estimated body mass index is 30.75 kg/m as calculated from the following:   Height as of this encounter: 5\' 11"  (1.803 m).   Weight as of this encounter: 100 kg.    DVT prophylaxis: SCDs Code Status:   Code Status: Full Code Family Communication: None at bedside Disposition Plan: Transfer to progressive care unit. Discharge back to facility pending ongoing workup/specialist management   Consultants:  Gastroenterology Cardiology  Procedures:  None  Antimicrobials: None    Subjective: Feels cold. No other symptoms. She does report having had COVID-19 infections twice in the recent past. Patient also mentioned a long history of leg tremors L>R that she has mentioned to her doctors as an outpatient.  Objective: BP (!) 121/57 (BP Location: Left Arm)   Pulse (!) 38   Temp (!) 91.9 F (33.3 C) (Axillary) Comment: air warmer applied, [name] Manders, MD to transfe to higher level of care r/t hypothermia/HB  Resp 16   Ht 5\' 11"  (1.803 m)   Wt 100 kg   SpO2 100%   BMI 30.75 kg/m   Examination:  General exam: Appears calm and comfortable.  Respiratory system: Clear to auscultation. Respiratory effort normal. Cardiovascular system: S1 & S2 heard,  bradycardia. No murmurs. Gastrointestinal system: Abdomen is nondistended, soft and nontender. Normal bowel sounds heard. Central nervous system: Alert and oriented. 3/5 RLE strength and 4/5 LLE strength Musculoskeletal: BLE edema.  Skin: Cool to touch. Psychiatry: Judgement and insight appear normal. Mood & affect appropriate.    Data Reviewed: I have personally reviewed following labs and imaging studies  CBC Lab Results  Component Value Date   WBC 3.4 (L) 05/24/2023   RBC 3.60 (L) 05/24/2023   HGB 9.9 (L) 05/24/2023   HCT 31.6 (L) 05/24/2023   MCV 87.8 05/24/2023   MCH 27.5 05/24/2023   PLT 183 05/24/2023   MCHC 31.3 05/24/2023   RDW 17.7 (H) 05/24/2023   LYMPHSABS 1.4 05/22/2023   MONOABS 0.3 05/22/2023   EOSABS 0.1 05/22/2023   BASOSABS 0.0 05/22/2023     Last metabolic panel Lab Results  Component Value Date   NA 142 05/24/2023   K 4.4 05/24/2023   CL 110 05/24/2023   CO2 25 05/24/2023   BUN 19 05/24/2023   CREATININE 1.05 (H) 05/24/2023   GLUCOSE 89 05/24/2023   GFRNONAA 55 (L) 05/24/2023   GFRAA 41 (L) 01/09/2019   CALCIUM 9.3 05/24/2023   PHOS 3.7 03/19/2022   PROT 6.0 (L) 05/23/2023   ALBUMIN 2.8 (L) 05/23/2023   BILITOT 0.4 05/23/2023   ALKPHOS 55 05/23/2023   AST 24 05/23/2023   ALT 17 05/23/2023   ANIONGAP 7 05/24/2023    GFR: Estimated Creatinine Clearance: 59.4 mL/min (A) (by C-G formula based on SCr of 1.05 mg/dL (H)).  No results found for this or any previous visit (from the past 240 hours).    Radiology Studies: No results found.    LOS: 1 day    Jacquelin Hawking, MD [facility] 05/24/2023, 11:21 AM   If 7PM-7AM, please contact night-coverage [email or web address]

## 2023-05-24 NOTE — Consult Note (Addendum)
Cardiology Consultation   Patient ID: [name] [name] MRN: [id number]; DOB: [birth year]  Admit date: 05/22/2023 Date of Consult: 05/24/2023  PCP:  Oneita Hurt, No   [facility] Providers Cardiologist:  None        Patient Profile:   [name] [name] is a 78 y.o. female with a history of SVT s/p AVNRT ablation in 11/2013, chronic lower extremity edema, DVT on Eliquis, hypertension, type 2 diabetes mellitus, GERD, esophageal dysmotility,and obesity who is being seen 05/24/2023 for the evaluation of bradycardia at the request of Dr. Caleb Popp.  History of Present Illness:   [name] [name] is a 78 year old female with the above history who has been followed by Dr. Swaziland and Dr. Ladona Ridgel in the past but has not been seen in several years. She has a history of SVT and underwent a AVNRT ablation in 11/2013. Last Echo around that time showed LVEF of 60-65% with normal wall motion and grade 2 diastolic dysfunction as well as very mild AS. He was last seen by Cardiology in 2016.   Patient presented to the ED on 05/22/2023 from [facility] for further evaluation of black tarry stools and abdominal pain. Upon arrival to the ED, BP stable. EKG showed sinus bradycardia, rate 48 bpm, with no acute ischemic changes. She was noted to be hypothermic in the ED. WBC 4.2, Hgb 11.6, Plts 249. Na 148, K 4.9, Glucose 61, Cr 1.11. Albumin 3.4, AST 27, ALT 21, Alk Phos 73, Total Bili 0.3. Hemoccult positive. GI was consulted and plan was for EGD today. However, GI recommended postponing this today due to bradycardia and hypothermia.   At the time of this evaluation, patient is resting comfortably in no acute distress. Reviewed telemetry which showed sinus rhythm with significant 1st degree AV block and intermittent 2nd degree type 1 AV block (Wenckebach). Rates also low as the 30s but currently in the 60s to 70s. BP soft but stable (systolic BP in the 90s to low 782N). She states she has resided at [facility] for the past  several years and is essentially bedbound. However, it sounds like she has been stable from a cardiac standpoint. No obvious heart racing issues. No chest pain, shortness of breath, orthopnea, syncope. She has chronic lower extremity edema. No fevers or cough. No other abnormal bleeding other than melena.  Past Medical History:  Diagnosis Date   Acute kidney injury (HCC)    Aortic stenosis, mild 11/17/2013   Arthritis    "both knees, spine, back, fingers" (11/29/2013)   CHF (congestive heart failure) (HCC)    Edema    GERD (gastroesophageal reflux disease)    Heart murmur    History of diastolic dysfunction    Echo 07/2008 with diastolic dysfunction   HTN (hypertension)    Morbid obesity (HCC)    Multifocal pneumonia 03/25/2022   OA (osteoarthritis)    PAC (premature atrial contraction)    per prior Holter   Palpitations    Pneumonia    "as a child"   PVC's (premature ventricular contractions)    per prior Holter   SVT (supraventricular tachycardia) (HCC)    Tachycardia    noted at 07/28/11 visit. Started on beta blocker. Possible atrial flutter vs long PR tachycardia/AVNRT   Type II diabetes mellitus (HCC)     Past Surgical History:  Procedure Laterality Date   BIOPSY  03/23/2020   Procedure: BIOPSY;  Surgeon: Charna Elizabeth, MD;  Location: [facility] ENDOSCOPY;  Service: Endoscopy;;   CESAREAN SECTION  1985   CESAREAN SECTION  1985   ESOPHAGOGASTRODUODENOSCOPY (EGD) WITH PROPOFOL N/A 03/23/2020   Procedure: ESOPHAGOGASTRODUODENOSCOPY (EGD) WITH PROPOFOL;  Surgeon: Charna Elizabeth, MD;  Location: [facility] ENDOSCOPY;  Service: Endoscopy;  Laterality: N/A;   SUPRAVENTRICULAR TACHYCARDIA ABLATION  11/29/2013   SUPRAVENTRICULAR TACHYCARDIA ABLATION N/A 11/29/2013   Procedure: SUPRAVENTRICULAR TACHYCARDIA ABLATION;  Surgeon: Marinus Maw, MD;  Location: [facility] CATH LAB;  Service: Cardiovascular;  Laterality: N/A;   US ECHOCARDIOGRAPHY  07/25/2008   EF 55-60%   VAGINAL DELIVERY       Home Medications:   Prior to Admission medications   Medication Sig Start Date End Date Taking? Authorizing Provider  acetaminophen (TYLENOL) 500 MG tablet Take 1,000 mg by mouth every 8 (eight) hours as needed for mild pain or moderate pain.   Yes [provider]  albuterol (VENTOLIN HFA) 108 (90 Base) MCG/ACT inhaler Inhale 2 puffs into the lungs every 6 (six) hours as needed for wheezing or shortness of breath. 09/17/20  Yes [provider]  apixaban (ELIQUIS) 2.5 MG TABS tablet Take by mouth 2 (two) times daily.   Yes [provider]  benzocaine (ORAJEL) 10 % mucosal gel Use as directed 1 application  in the mouth or throat every 6 (six) hours as needed (gum pain, irritation).   Yes [provider]  buPROPion (WELLBUTRIN) 75 MG tablet Take 75 mg by mouth 3 (three) times daily.   Yes [provider]  colchicine 0.6 MG tablet Take 1 tablet (0.6 mg total) by mouth daily. 03/25/22  Yes Almon Hercules, MD  cycloSPORINE (RESTASIS) 0.05 % ophthalmic emulsion Place 1 drop into both eyes 2 (two) times daily.   Yes [provider]  diclofenac Sodium (VOLTAREN) 1 % GEL Apply 4 g topically every 8 (eight) hours. To right hip   Yes [provider]  diphenhydramine-calamine (CALADRYL) 1-8 % LOTN Apply 1 application  topically every 4 (four) hours as needed (itching).   Yes [provider]  ferrous sulfate 325 (65 FE) MG tablet Take 1 tablet (325 mg total) by mouth daily with breakfast. 02/21/21 07/10/23 Yes Jerald Kief, MD  folic acid (FOLVITE) 1 MG tablet Take 1 mg by mouth daily.   Yes [provider]  glipiZIDE (GLUCOTROL XL) 2.5 MG 24 hr tablet Take 2.5 mg by mouth daily. 02/01/21  Yes [provider]  guaifenesin (HUMIBID E) 400 MG TABS tablet Take 400 mg by mouth 2 (two) times daily.   Yes [provider]  hydrocortisone 1 % lotion Apply 1 Application topically every 6 (six) hours as needed for itching. To right arm   Yes  [provider]  hydroxypropyl methylcellulose / hypromellose (ISOPTO TEARS / GONIOVISC) 2.5 % ophthalmic solution Place 1 drop into both eyes every 6 (six) hours as needed for dry eyes.   Yes [provider]  ketotifen (ZADITOR) 0.035 % ophthalmic solution Place 1 drop into both eyes every 12 (twelve) hours as needed (eye itch).   Yes [provider]  LINIMENTS EX Apply topically.   Yes [provider]  loperamide (IMODIUM A-D) 2 MG tablet Take 2 mg by mouth See admin instructions. Give 1 tablet (2mg ) by mouth as needed after each loose stool. Not to exceed 4 doses per day.   Yes [provider]  loratadine (CLARITIN) 10 MG tablet Take 10 mg by mouth daily.   Yes [provider]  magnesium hydroxide (MILK OF MAGNESIA) 400 MG/5ML suspension Take 30 mLs by  mouth daily as needed for mild constipation.   Yes [provider]  meclizine (ANTIVERT) 25 MG tablet Take 25 mg by mouth 3 (three) times daily.   Yes [provider]  melatonin 3 MG TABS tablet Take 3 mg by mouth at bedtime.   Yes [provider]  Menthol-Camphor 5.1-5.1 % OINT Apply 1 application  topically every 6 (six) hours as needed (joint, muscle pain). To knees/affect joints   Yes [provider]  methocarbamol (ROBAXIN) 500 MG tablet Take 1,000 mg by mouth every 8 (eight) hours as needed for muscle spasms. 04/12/17  Yes [provider]  metoprolol tartrate (LOPRESSOR) 25 MG tablet Take 1 tablet (25 mg total) by mouth 2 (two) times daily. 02/20/21 07/10/23 Yes Jerald Kief, MD  montelukast (SINGULAIR) 10 MG tablet Take 1 tablet (10 mg total) by mouth at bedtime. 07/28/19  Yes Myrlene Broker, MD  Multiple Vitamin (MULTIVITAMIN WITH MINERALS) TABS tablet Take 1 tablet by mouth daily. 03/26/20  Yes Gherghe, Daylene Katayama, MD  Multiple Vitamins-Minerals (PRESERVISION AREDS 2 PO) Take 1 capsule by mouth 2 (two) times daily.   Yes [provider]  Nutritional Supplements (ENSURE PO) Take 237 mLs by mouth 2 (two) times daily.   Yes [provider]  omeprazole (PRILOSEC) 40 MG capsule Take 40 mg by mouth daily.   Yes [provider]  ondansetron (ZOFRAN) 4 MG tablet Take 4 mg by mouth every 8 (eight) hours as needed for nausea.   Yes [provider]  oseltamivir (TAMIFLU) 75 MG capsule Take 75 mg by mouth daily.   Yes [provider]  promethazine (PHENERGAN) 25 MG suppository Place 1 suppository (25 mg total) rectally every 6 (six) hours as needed for nausea or vomiting. 02/27/20  Yes Myrlene Broker, MD  sodium chloride (OCEAN) 0.65 % SOLN nasal spray Place 1 spray into both nostrils every 2 (two) hours as needed for congestion (nasal dryness).   Yes [provider]  sucralfate (CARAFATE) 1 GM/10ML suspension Take 1 g by mouth 4 (four) times daily -  before meals and at bedtime.   Yes [provider]  thiamine 100 MG tablet Take 1 tablet (100 mg total) by mouth daily. 03/26/20  Yes Leatha Gilding, MD  trolamine salicylate (ASPERCREME) 10 % cream Apply 1 Application topically every 6 (six) hours as needed (right hand pain).   Yes [provider]  ZINC OXIDE EX Apply 1 application  topically 3 (three) times daily. To buttocks, every shift.   Yes [provider]    Inpatient Medications: Scheduled Meds:  buPROPion  75 mg Oral TID   [START ON 05/25/2023] cosyntropin  0.25 mg Intravenous Once   cycloSPORINE  1 drop Both Eyes BID   feeding supplement  237 mL Oral BID BM   pantoprazole (PROTONIX) IV  40 mg Intravenous Q12H   sodium chloride flush  3 mL Intravenous Q12H   Continuous Infusions:  dextrose 75 mL/hr at 05/24/23 1500   PRN Meds: acetaminophen **OR** acetaminophen, albuterol, HYDROcodone-acetaminophen, hydroxypropyl methylcellulose / hypromellose  Allergies:    Allergies  Allergen Reactions   Roxicodone [Oxycodone] Nausea And Vomiting    Penicillins Itching and Other (See Comments)    Remote reaction, no anaphylaxis or severe cutaneous symptoms Tolerates Cefepime    Social History:   Social History   Socioeconomic History   Marital status: Single    Spouse name: Not on file   Number of children: 2  Years of education: Not on file   Highest education level: Not on file  Occupational History   Occupation: housekeeping    Employer: [employer]    Comment: disabled   Occupation: retired  Tobacco Use   Smoking status: Former    Current packs/day: 0.00    Average packs/day: 2.0 packs/day for 15.0 years (30.0 ttl pk-yrs)    Types: Cigarettes    Start date: 11/24/1970    Quit date: 11/23/1985    Years since quitting: 37.5   Smokeless tobacco: Never  Vaping Use   Vaping status: Never Used  Substance and Sexual Activity   Alcohol use: No   Drug use: No   Sexual activity: Never  Other Topics Concern   Not on file  Social History Narrative   06/17/20 lives at [facility]    Social Drivers of Health   Financial Resource Strain: Low Risk  (03/03/2022)   Overall Financial Resource Strain (CARDIA)    Difficulty of Paying Living Expenses: Not hard at all  Food Insecurity: No Food Insecurity (05/22/2023)   Hunger Vital Sign    Worried About Running Out of Food in the Last Year: Never true    Ran Out of Food in the Last Year: Never true  Transportation Needs: No Transportation Needs (05/22/2023)   PRAPARE - Administrator, Civil Service (Medical): No    Lack of Transportation (Non-Medical): No  Physical Activity: Inactive (03/03/2022)   Exercise Vital Sign    Days of Exercise per Week: 0 days    Minutes of Exercise per Session: 0 min  Stress: No Stress Concern Present (03/03/2022)   [employer] of Occupational Health - Occupational Stress Questionnaire    Feeling of Stress : Not at all  Social Connections: Unknown (05/23/2023)   Social Connection and Isolation Panel [NHANES]    Frequency of  Communication with Friends and Family: Three times a week    Frequency of Social Gatherings with Friends and Family: Once a week    Attends Religious Services: Not on Marketing executive or Organizations: No    Attends Banker Meetings: Never    Marital Status: Never married  Intimate Partner Violence: Not At Risk (05/22/2023)   Humiliation, Afraid, Rape, and Kick questionnaire    Fear of Current or Ex-Partner: No    Emotionally Abused: No    Physically Abused: No    Sexually Abused: No    Family History:   Family History  Problem Relation Age of Onset   Alzheimer's disease Mother    Lung cancer Mother    COPD Father    Diabetes Maternal Uncle    Stroke Neg Hx    Colon cancer Neg Hx    Stomach cancer Neg Hx    Esophageal cancer Neg Hx      ROS:  Please see the history of present illness.    Physical Exam/Data:   Vitals:   05/24/23 1146 05/24/23 1241 05/24/23 1348 05/24/23 1444  BP: (!) 93/55 (!) 104/59 (!) 106/59 (!) 103/57  Pulse: (!) 43 (!) 44 (!) 44 65  Resp: 14 16 20 20   Temp: (!) 92.5 F (33.6 C) (!) 93.5 F (34.2 C) (!) 95 F (35 C) 97.7 F (36.5 C)  TempSrc: Axillary Axillary Axillary Oral  SpO2: 100% 100% 98% 97%  Weight:      Height:        Intake/Output Summary (Last 24 hours) at 05/24/2023 1541 Last data  filed at 05/24/2023 1500 Gross per 24 hour  Intake 1613.62 ml  Output 900 ml  Net 713.62 ml      05/22/2023    4:49 PM 10/05/2022   10:37 AM 04/21/2022    8:17 AM  Last 3 Weights  Weight (lbs) 220 lb 7.4 oz 220 lb 8 oz --  Weight (kg) 100 kg 100.018 kg --     Body mass index is 30.75 kg/m.   Physical Exam per MD: General: 78 y.o. African-American female resting comfortably in no acute distress. HEENT: Normocephalic and atraumatic.  Neck: Supple.  Heart: RRR. Soft benign ejection murmur.  Radial pulses 2+ and equal bilaterally. Lungs: No increased work of breathing. Clear to ausculation bilaterally. No wheezes, rhonchi,  or rales.  Extremities: Mild lower extremity edema bilaterally.    Skin: Warm and dry. Neuro: No focal deficits. Psych: Normal affect. Responds appropriately.   EKG:  The EKG was personally reviewed and demonstrates:  Sinus bradycardia, rate 48 bpm, with no acute ischemic changes Telemetry:  Telemetry was personally reviewed and demonstrates:  Sinus rhythm with significant 1st degree AV block at baseline and intermittent 2nd degree type 1 AV block. Rates as low as the 30s but currently in the 60s to 70s.  Relevant CV Studies:  Echocardiogram 11/18/2023: Study Conclusion: - Left ventricle: The cavity size was normal. There was mild    concentric hypertrophy. Systolic function was normal. The    estimated ejection fraction was in the range of 60% to 65%. Wall    motion was normal; there were no regional wall motion    abnormalities. Features are consistent with a pseudonormal left    ventricular filling pattern, with concomitant abnormal relaxation    and increased filling pressure (grade 2 diastolic dysfunction).  - Aortic valve: There was very mild stenosis. Valve area (VTI):    1.83 cm^2. Valve area (Vmax): 1.87 cm^2. Valve area (Vmean): 2    cm^2.  - Left atrium: The atrium was mildly dilated.    Laboratory Data:  High Sensitivity Troponin:  No results for input(s): "TROPONINIHS" in the last 720 hours.   Chemistry Recent Labs  Lab 05/22/23 1700 05/23/23 0838 05/24/23 0539  NA 148* 147* 142  K 4.9 4.2 4.4  CL 111 112* 110  CO2 26 26 25   GLUCOSE 61* 60* 89  BUN 18 16 19   CREATININE 1.11* 1.00 1.05*  CALCIUM 10.1 9.5 9.3  GFRNONAA 52* 58* 55*  ANIONGAP 11 9 7     Recent Labs  Lab 05/22/23 1700 05/23/23 0838  PROT 7.0 6.0*  ALBUMIN 3.4* 2.8*  AST 27 24  ALT 21 17  ALKPHOS 73 55  BILITOT 0.3 0.4   Lipids No results for input(s): "CHOL", "TRIG", "HDL", "LABVLDL", "LDLCALC", "CHOLHDL" in the last 168 hours.  Hematology Recent Labs  Lab 05/22/23 2120  05/23/23 0838 05/24/23 0539  WBC 4.8 3.2* 3.4*  RBC 3.91 3.69* 3.60*  HGB 10.6* 10.0* 9.9*  HCT 34.1* 31.8* 31.6*  MCV 87.2 86.2 87.8  MCH 27.1 27.1 27.5  MCHC 31.1 31.4 31.3  RDW 17.6* 17.7* 17.7*  PLT 238 211 183   Thyroid  Recent Labs  Lab 05/24/23 0909  TSH 4.287    BNPNo results for input(s): "BNP", "PROBNP" in the last 168 hours.  DDimer No results for input(s): "DDIMER" in the last 168 hours.   Radiology/Studies:  No results found.   Assessment and Plan:   Bradycardia Intermittent 2nd Degree Type 1 AV Block (Wenckebach) History  of SVT s/p Ablation Patient has a history of SVT s/p AVNRT ablation in 2015. She was admitted with an acute GI bleed after presenting with tarry stools and abdominal pain. Hemoccult positive. In the ED, she was noted to be hypothermic and bradycardic. EKG shows sinus bradycardia with a significant 1st degree AV block and rate of 48 bpm. Per review of chart, she has a history of bradycardia with rates in the 40s. Telemetry shows sinus rhythm with intermittent 2nd degree type 1 AV blocker with rates as low as the 30s but currently in the 60s to 70s. BP soft but stable. Asymptomatic with this. Looks like bradycardia has improved with improvement of hypothermia. TSH and potassium level normal. Will check Magnesium for thoroughness. Agree with holding home Metoprolol. Would avoid AV nodal agents in the future. No indication for PPM at this time. Continue to monitor on telemetry.   Pre-Op Evaluation Patient needs a EGD for further evaluation of GI bleeding. She is stable from a cardiac standpoint. She is bedbound so is unable to meet 4.0 METS. However, EGD is a low risk procedure so OK to proceed with this without any additional cardiac evaluation. She is on Eliquis at home for history of DVT. This is currently on hold. Will defer recommendations for restarting this to GI and primary team.  Otherwise, per primary team: - GI bleeding - Chronic anemia -  Hypothermia - Chronic lower extremity edema - Leg tremors - Hypernatremia - CKD stage IIIa  - History of DVT   Risk Assessment/Risk Scores:    For questions or updates, please contact  [facility] Please consult [email or web address] for contact info under    Signed, Corrin Parker, PA-C  05/24/2023 3:41 PM   Attending Note:   The patient was seen and examined.  Agree with assessment and plan as noted above.  Changes made to the above note as needed.  Patient seen and independently examined with Marjie Skiff, PA .   We discussed all aspects of the encounter. I agree with the assessment and plan as stated above.   1.  Bradycardia: She has been bradycardic for years.  She has evidence of Mobitz second-degree type I AV block.  She has been on metoprolol for a long history of SVT.  She has had SVT ablation and has not had any recurrent episodes of SVT. At this point we will discontinue the metoprolol.  She is not having any symptoms of unstable angina and no symptoms of congestive heart failure.  It is difficult to assess her exercise capacity because she has been bedbound for the past many years.  She does not seem to have any high risk symptoms or signs.  I think that she is at low risk for her upcoming EGD.  2.  History of DVT: Her Eliquis is on hold.  I will defer further management of her DVT to the primary medical team.  [facility] will sign off.   Medication Recommendations:   DC metoprolol  Other recommendations (labs, testing, etc):   Follow up as an outpatient:  with her primary MD .  She is bed bound at the nursing facility .   She does not need further cardiology follow up unless she has other cardiac problems        I have spent a total of 40 minutes with patient reviewing hospital  notes , telemetry, EKGs, labs and examining patient as well as establishing an assessment and plan that was discussed with  the patient.  > 50% of time was spent in  direct patient care.    Vesta Mixer, Montez Hageman., MD, [facility] 05/24/2023, 3:48 PM [address]  [address] Office [phone] Pager [phone]

## 2023-05-25 ENCOUNTER — Encounter (HOSPITAL_COMMUNITY): Payer: Self-pay | Admitting: Internal Medicine

## 2023-05-25 ENCOUNTER — Inpatient Hospital Stay (HOSPITAL_COMMUNITY): Payer: 59 | Admitting: Anesthesiology

## 2023-05-25 ENCOUNTER — Telehealth: Payer: Self-pay

## 2023-05-25 ENCOUNTER — Encounter (HOSPITAL_COMMUNITY)
Admission: EM | Disposition: A | Discharge status: Skilled Nursing Facility | Payer: Self-pay | Source: Skilled Nursing Facility | Attending: Family Medicine

## 2023-05-25 DIAGNOSIS — K2289 Other specified disease of esophagus: Secondary | ICD-10-CM | POA: Diagnosis not present

## 2023-05-25 DIAGNOSIS — I10 Essential (primary) hypertension: Secondary | ICD-10-CM | POA: Diagnosis not present

## 2023-05-25 DIAGNOSIS — K2970 Gastritis, unspecified, without bleeding: Secondary | ICD-10-CM | POA: Diagnosis not present

## 2023-05-25 DIAGNOSIS — B3781 Candidal esophagitis: Secondary | ICD-10-CM | POA: Diagnosis not present

## 2023-05-25 DIAGNOSIS — K2990 Gastroduodenitis, unspecified, without bleeding: Secondary | ICD-10-CM | POA: Diagnosis not present

## 2023-05-25 DIAGNOSIS — I509 Heart failure, unspecified: Secondary | ICD-10-CM

## 2023-05-25 DIAGNOSIS — K2980 Duodenitis without bleeding: Secondary | ICD-10-CM

## 2023-05-25 DIAGNOSIS — I110 Hypertensive heart disease with heart failure: Secondary | ICD-10-CM

## 2023-05-25 DIAGNOSIS — K921 Melena: Secondary | ICD-10-CM | POA: Diagnosis not present

## 2023-05-25 DIAGNOSIS — M4802 Spinal stenosis, cervical region: Secondary | ICD-10-CM | POA: Diagnosis not present

## 2023-05-25 DIAGNOSIS — N1831 Chronic kidney disease, stage 3a: Secondary | ICD-10-CM | POA: Diagnosis not present

## 2023-05-25 HISTORY — PX: BIOPSY: SHX5522

## 2023-05-25 HISTORY — PX: ESOPHAGOGASTRODUODENOSCOPY (EGD) WITH PROPOFOL: SHX5813

## 2023-05-25 LAB — ACTH STIMULATION, 3 TIME POINTS
Cortisol, 30 Min: 18.9 ug/dL
Cortisol, 60 Min: 7 ug/dL
Cortisol, Base: 7.6 ug/dL

## 2023-05-25 LAB — CBC
HCT: 32.7 % — ABNORMAL LOW (ref 36.0–46.0)
Hemoglobin: 10.3 g/dL — ABNORMAL LOW (ref 12.0–15.0)
MCH: 27.4 pg (ref 26.0–34.0)
MCHC: 31.5 g/dL (ref 30.0–36.0)
MCV: 87 fL (ref 80.0–100.0)
Platelets: 187 10*3/uL (ref 150–400)
RBC: 3.76 MIL/uL — ABNORMAL LOW (ref 3.87–5.11)
RDW: 17.8 % — ABNORMAL HIGH (ref 11.5–15.5)
WBC: 3.5 10*3/uL — ABNORMAL LOW (ref 4.0–10.5)
nRBC: 0 % (ref 0.0–0.2)

## 2023-05-25 LAB — GLUCOSE, CAPILLARY
Glucose-Capillary: 103 mg/dL — ABNORMAL HIGH (ref 70–99)
Glucose-Capillary: 107 mg/dL — ABNORMAL HIGH (ref 70–99)
Glucose-Capillary: 113 mg/dL — ABNORMAL HIGH (ref 70–99)
Glucose-Capillary: 139 mg/dL — ABNORMAL HIGH (ref 70–99)
Glucose-Capillary: 241 mg/dL — ABNORMAL HIGH (ref 70–99)
Glucose-Capillary: 96 mg/dL (ref 70–99)

## 2023-05-25 LAB — C-PEPTIDE: C-Peptide: 3.6 ng/mL (ref 1.1–4.4)

## 2023-05-25 SURGERY — ESOPHAGOGASTRODUODENOSCOPY (EGD) WITH PROPOFOL
Anesthesia: Monitor Anesthesia Care

## 2023-05-25 MED ORDER — ACETAMINOPHEN 325 MG PO TABS: 325.0000 mg | ORAL_TABLET | ORAL | Status: DC | PRN

## 2023-05-25 MED ORDER — OXYCODONE HCL 5 MG/5ML PO SOLN: 5.0000 mg | Freq: Once | ORAL | Status: DC | PRN

## 2023-05-25 MED ORDER — ORAL CARE MOUTH RINSE: 15.0000 mL | OROMUCOSAL | Status: DC | PRN

## 2023-05-25 MED ORDER — MEPERIDINE HCL 25 MG/ML IJ SOLN: 6.2500 mg | INTRAMUSCULAR | Status: DC | PRN

## 2023-05-25 MED ORDER — SODIUM CHLORIDE 0.9 % IV SOLN: INTRAVENOUS | Status: DC

## 2023-05-25 MED ORDER — DEXTROSE 10 % IV SOLN: INTRAVENOUS | Status: DC

## 2023-05-25 MED ORDER — ACETAMINOPHEN 160 MG/5ML PO SOLN: 325.0000 mg | ORAL | Status: DC | PRN

## 2023-05-25 MED ORDER — FENTANYL CITRATE (PF) 100 MCG/2ML IJ SOLN: 25.0000 ug | INTRAMUSCULAR | Status: DC | PRN

## 2023-05-25 MED ORDER — PROPOFOL 500 MG/50ML IV EMUL
INTRAVENOUS | Status: DC | PRN
  Administered 2023-05-25: 160 ug/kg/min via INTRAVENOUS

## 2023-05-25 MED ORDER — ONDANSETRON HCL 4 MG/2ML IJ SOLN: 4.0000 mg | Freq: Once | INTRAMUSCULAR | Status: DC | PRN

## 2023-05-25 MED ORDER — OXYCODONE HCL 5 MG PO TABS: 5.0000 mg | ORAL_TABLET | Freq: Once | ORAL | Status: DC | PRN

## 2023-05-25 SURGICAL SUPPLY — 14 items

## 2023-05-25 NOTE — Op Note (Signed)
[facility] Patient Name: [name] [name] Procedure Date : 05/25/2023 MRN: [id number] Attending MD: Particia Lather , , [id number] Date of Birth: [birth year] CSN: [id number] Age: 78 Admit Type: Inpatient Procedure:                Upper GI endoscopy Indications:              Melena, Anemia Providers:                Nicole Kindred "Eulah Pont, [place] "Lynelle Doctor, RN,                            Kandice Robinsons, Technician Referring MD:             Hospitalist team Medicines:                Monitored Anesthesia Care Complications:            No immediate complications. Estimated Blood Loss:     Estimated blood loss was minimal. Procedure:                Pre-Anesthesia Assessment:                           - Prior to the procedure, a History and Physical                            was performed, and patient medications and                            allergies were reviewed. The patient's tolerance of                            previous anesthesia was also reviewed. The risks                            and benefits of the procedure and the sedation                            options and risks were discussed with the patient.                            All questions were answered, and informed consent                            was obtained. Prior Anticoagulants: The patient has                            taken Eliquis (apixaban), last dose was 2 days                            prior to procedure. ASA Grade Assessment: III - A                            patient with severe systemic disease. After  reviewing the risks and benefits, the patient was                            deemed in satisfactory condition to undergo the                            procedure.                           After obtaining informed consent, the endoscope was                            passed under direct vision. Throughout the                            procedure, the  patient's blood pressure, pulse, and                            oxygen saturations were monitored continuously. The                            [id number]-[id number] ([id number]) Olympus endoscope was introduced                            through the mouth, and advanced to the second part                            of duodenum. The upper GI endoscopy was                            accomplished without difficulty. The patient                            tolerated the procedure well. Scope In: Scope Out: Findings:      White nummular lesions were noted in the upper third of the esophagus.       Biopsies were taken with a cold forceps for histology.      Localized inflammation characterized by congestion (edema), erythema and       nodularity was found in the gastric fundus and in the gastric body.       Biopsies were taken with a cold forceps for histology.      Localized mild inflammation characterized by congestion (edema),       erosions and erythema was found in the duodenal bulb. Impression:               - White nummular lesions in esophageal mucosa.                            Biopsied.                           - Gastritis. Biopsied.                           - Duodenitis. Recommendation:           - Return  patient to hospital ward for ongoing care.                           - Await pathology results.                           - Okay to restart Eliquis tomorrow.                           - Continue PPI BID for 8 weeks.                           - Will check iron studies. Please replete iron if                            low.                           - Patient's hemoglobin has remained stable during                            this hospitalization. We will arrange for                            outpatient GI follow up to discuss whether or not                            the patient would be interested in a colonoscopy.                           - The findings and recommendations were discussed                             with the patient. Procedure Code(s):        --- Professional ---                           [phone], Esophagogastroduodenoscopy, flexible,                            transoral; with biopsy, single or multiple Diagnosis Code(s):        --- Professional ---                           K22.89, Other specified disease of esophagus                           K29.70, Gastritis, unspecified, without bleeding                           K29.80, Duodenitis without bleeding                           K92.1, Melena (includes Hematochezia)  D64.9, Anemia, unspecified CPT copyright 2022 American Medical Association. All rights reserved. The codes documented in this report are preliminary and upon coder review may  be revised to meet current compliance requirements. Dr Particia Lather "Alan Ripper" Leonides Schanz,  05/25/2023 12:52:39 PM Number of Addenda: 0

## 2023-05-25 NOTE — Progress Notes (Signed)
Brief GI Update Note: Cardiology evaluated the patient and thinks that it would be safe for her to undergo an EGD procedure. Her hypothermia has resolved. Will plan to proceed with EGD today to evaluate for a source of her melena. Her last EGD in 2021 showed grade C esophagitis and duodenal ulcers. Hb has remained stable from yesterday to today.

## 2023-05-25 NOTE — Plan of Care (Signed)

## 2023-05-25 NOTE — Interval H&P Note (Signed)
History and Physical Interval Note:  05/25/2023 11:05 AM  [name] [name]  has presented today for surgery, with the diagnosis of Melena, anemia.  The various methods of treatment have been discussed with the patient and family. After consideration of risks, benefits and other options for treatment, the patient has consented to  Procedure(s): ESOPHAGOGASTRODUODENOSCOPY (EGD) WITH PROPOFOL (N/A) as a surgical intervention.  The patient's history has been reviewed, patient examined, no change in status, stable for surgery.  I have reviewed the patient's chart and labs.  Questions were answered to the patient's satisfaction.     Imogene Burn

## 2023-05-25 NOTE — Progress Notes (Signed)
PROGRESS NOTE    [name] [name]  ZOX:[id number] DOB: 01-17-1946 DOA: 05/22/2023 PCP: Pcp, No   Brief Narrative: [name] [name] is a 78 y.o. female with a history of diabetes mellitus type 2, hypertension, GERD, anemia, paroxysmal SVT, CKD stage III, obesity, lymphedema, incontinence, wernicke's aphasia, c-spine stenosis.  Patient presented secondary to black stools concerning for melena from upper GI bleeding. GI consulted. Hospitalization complicated by hypoglycemia with hypothermia and bradycardia.   Assessment and Plan:  Black stools Concern for melena from upper GI bleeding. FOBT positive. GI consulted and initial plan upper endoscopy on 2/17. -Trend CBC -GI recommendations: postponing upper endoscopy secondary to bradycardia and hypothermia; recommendations pending.  Hypothermia Unclear etiology. Temperature down to a low of 94.6 F requiring application of a warming blanket. Initially thought to have resolved, but now has returned with temperature down to 91.9 F on 2/17. No evidence of infectious source. Cortisol borderline low. TSH normal. Warming blanket discontinued overnight. -Follow-up blood cultures -Cosyntropin test -Trend temperature  Diabetes mellitus Currently uncontrolled with hypoglycemia. Patient is managed on glipizide 24 hour tablet as an outpatient.  -Hold D10 IV fluids today. -Check CBG q4 hours -Hold SSI -Discontinue glipizide on discharge  Bradycardia 1st degree heart block Possibly related to hypoglycemia. Episodes of bradycardia with short pauses noted on telemetry. Patient is on metoprolol as an outpatient which was held on admission. Second degree type 1 heart block developed with rates down to 20s in relation to hypothermia.   Primary hypertension Patient is on metoprolol tartrate as an outpatient. Blood pressure is low-normal. Metoprolol held on admission.  Chronic anemia Hemoglobin baseline is around 10-11.  Stable.  Hypernatremia Secondary to free water deficit. Resolved with IV fluids.  Chronic anemia Baseline hemoglobin of 10-11. Hemoglobin is stable.  Paroxysmal SVT Patient is on metoprolol tartrate as an outpatient which is held secondary to bradycardia.  History of DVT Patient is on Eliquis as an outpatient which is held on admission secondary to GI bleeding.  CKD stage IIIa Stable.  Lymphedema Noted. Patient with Unna boots on.  C-spine stenosis Bedbound Per review, patient has not walked in several years. Currently resides at a SNF.  Leg tremors Patient has a history of cervical stenosis with cervical radiculopathy and lumbar stenosis. Patient has been bedbound for several years. No tremors noted on exam, however it is possible this is secondary to general leg weakness.  Obesity Borderline. Estimated body mass index is 30.75 kg/m as calculated from the following:   Height as of this encounter: 5\' 11"  (1.803 m).   Weight as of this encounter: 100 kg.    DVT prophylaxis: SCDs Code Status:   Code Status: Full Code Family Communication: None at bedside Disposition Plan: Discharge back to facility pending ongoing workup/specialist management   Consultants:  Gastroenterology Cardiology  Procedures:  None  Antimicrobials: None    Subjective: Only patient concern is her left leg. No other issues this morning.  Objective: BP (!) 101/54 (BP Location: Left Arm)   Pulse (!) 56   Temp 97.6 F (36.4 C) (Oral)   Resp 15   Ht 5\' 11"  (1.803 m)   Wt 100 kg   SpO2 98%   BMI 30.75 kg/m   Examination:  General exam: Appears calm and comfortable Respiratory system: Clear to auscultation. Respiratory effort normal. Cardiovascular system: S1 & S2 heard, slight bradycardia, normal rhythm.. Gastrointestinal system: Abdomen is nondistended, soft and nontender. Normal bowel sounds heard. Central nervous system: Alert and oriented.   Data Reviewed:  I have personally  reviewed following labs and imaging studies  CBC Lab Results  Component Value Date   WBC 3.4 (L) 05/24/2023   RBC 3.60 (L) 05/24/2023   HGB 9.9 (L) 05/24/2023   HCT 31.6 (L) 05/24/2023   MCV 87.8 05/24/2023   MCH 27.5 05/24/2023   PLT 183 05/24/2023   MCHC 31.3 05/24/2023   RDW 17.7 (H) 05/24/2023   LYMPHSABS 1.4 05/22/2023   MONOABS 0.3 05/22/2023   EOSABS 0.1 05/22/2023   BASOSABS 0.0 05/22/2023     Last metabolic panel Lab Results  Component Value Date   NA 142 05/24/2023   K 4.4 05/24/2023   CL 110 05/24/2023   CO2 25 05/24/2023   BUN 19 05/24/2023   CREATININE 1.05 (H) 05/24/2023   GLUCOSE 89 05/24/2023   GFRNONAA 55 (L) 05/24/2023   GFRAA 41 (L) 01/09/2019   CALCIUM 9.3 05/24/2023   PHOS 3.7 03/19/2022   PROT 6.0 (L) 05/23/2023   ALBUMIN 2.8 (L) 05/23/2023   BILITOT 0.4 05/23/2023   ALKPHOS 55 05/23/2023   AST 24 05/23/2023   ALT 17 05/23/2023   ANIONGAP 7 05/24/2023    GFR: Estimated Creatinine Clearance: 59.4 mL/min (A) (by C-G formula based on SCr of 1.05 mg/dL (H)).  No results found for this or any previous visit (from the past 240 hours).    Radiology Studies: No results found.    LOS: 2 days    Jacquelin Hawking, MD [facility] 05/25/2023, 8:41 AM   If 7PM-7AM, please contact night-coverage [email or web address]

## 2023-05-25 NOTE — Anesthesia Preprocedure Evaluation (Signed)
Anesthesia Evaluation  Patient identified by MRN, date of birth, ID band Patient awake    Reviewed: Allergy & Precautions, NPO status , Patient's Chart, lab work & pertinent test results  Airway Mallampati: II  TM Distance: >3 FB Neck ROM: Full    Dental  (+) Poor Dentition, Chipped, Missing, Dental Advisory Given   Pulmonary pneumonia, former smoker   Pulmonary exam normal breath sounds clear to auscultation       Cardiovascular hypertension, +CHF  Normal cardiovascular exam+ Valvular Problems/Murmurs  Rhythm:Regular Rate:Normal  Echo 03/2014 - Left ventricle: The cavity size was normal. There was mild concentric hypertrophy. Systolic function was normal. The estimated ejection fraction was in the range of 60% to 65%. Wall motion was normal; there were no regional wall motion abnormalities. Features are consistent with a pseudonormal left ventricular filling pattern, with concomitant abnormal relaxation and increased filling pressure (grade 2 diastolic dysfunction).  - Aortic valve: There was very mild stenosis. Valve area (VTI):   1.83 cm^2. Valve area (Vmax): 1.87 cm^2. Valve area (Vmean): 2  cm^2.  - Left atrium: The atrium was mildly dilated.     Neuro/Psych  PSYCHIATRIC DISORDERS      negative neurological ROS     GI/Hepatic Neg liver ROS,GERD  ,,  Endo/Other  negative endocrine ROSdiabetes    Renal/GU Renal disease     Musculoskeletal  (+) Arthritis ,    Abdominal  (+) + obese  Peds  Hematology  (+) Blood dyscrasia, anemia   Anesthesia Other Findings   Reproductive/Obstetrics                              Anesthesia Physical Anesthesia Plan  ASA: 3  Anesthesia Plan: MAC   Post-op Pain Management: Minimal or no pain anticipated   Induction: Intravenous  PONV Risk Score and Plan: 2 and Propofol infusion and Treatment may vary due to age or medical condition  Airway Management  Planned: Natural Airway, Simple Face Mask and Mask  Additional Equipment: None  Intra-op Plan:   Post-operative Plan:   Informed Consent: I have reviewed the patients History and Physical, chart, labs and discussed the procedure including the risks, benefits and alternatives for the proposed anesthesia with the patient or authorized representative who has indicated his/her understanding and acceptance.     Dental advisory given  Plan Discussed with: CRNA and Anesthesiologist  Anesthesia Plan Comments:          Anesthesia Quick Evaluation

## 2023-05-25 NOTE — Transfer of Care (Signed)
Immediate Anesthesia Transfer of Care Note  Patient: [name] [name]  Procedure(s) Performed: ESOPHAGOGASTRODUODENOSCOPY (EGD) WITH PROPOFOL BIOPSY  Patient Location: PACU  Anesthesia Type:MAC  Level of Consciousness: sedated  Airway & Oxygen Therapy: Patient Spontanous Breathing  Post-op Assessment: Report given to RN  Post vital signs: Reviewed and stable  Last Vitals:  Vitals Value Taken Time  BP    Temp    Pulse 82 05/25/23 1248  Resp 13 05/25/23 1248  SpO2 96 % 05/25/23 1248  Vitals shown include unfiled device data.  Last Pain:  Vitals:   05/25/23 1104  TempSrc: Temporal  PainSc: 0-No pain      Patients Stated Pain Goal: 0 (05/24/23 1640)  Complications: No notable events documented.

## 2023-05-25 NOTE — Plan of Care (Signed)

## 2023-05-25 NOTE — Anesthesia Postprocedure Evaluation (Signed)
Anesthesia Post Note  Patient: [name] [name]  Procedure(s) Performed: ESOPHAGOGASTRODUODENOSCOPY (EGD) WITH PROPOFOL BIOPSY     Patient location during evaluation: PACU Anesthesia Type: MAC Level of consciousness: awake and alert Pain management: pain level controlled Vital Signs Assessment: post-procedure vital signs reviewed and stable Respiratory status: spontaneous breathing, nonlabored ventilation, respiratory function stable and patient connected to nasal cannula oxygen Cardiovascular status: stable and blood pressure returned to baseline Postop Assessment: no apparent nausea or vomiting Anesthetic complications: no   No notable events documented.  Last Vitals:  Vitals:   05/25/23 1104 05/25/23 1250  BP: 127/61 (!) 86/60  Pulse:  81  Resp: 14 (!) 21  Temp: (!) 36.3 C (!) 36.3 C  SpO2: 99% 96%    Last Pain:  Vitals:   05/25/23 1256  TempSrc:   PainSc: 0-No pain                 Unice Vantassel

## 2023-05-25 NOTE — Telephone Encounter (Signed)
Called patient per provider request to schedule a follow appointment in office no answer left message on voice mail to call office back.

## 2023-05-25 NOTE — Progress Notes (Signed)
   05/25/23 2220  BiPAP/CPAP/SIPAP  Reason BIPAP/CPAP not in use Non-compliant

## 2023-05-26 DIAGNOSIS — K922 Gastrointestinal hemorrhage, unspecified: Secondary | ICD-10-CM | POA: Diagnosis not present

## 2023-05-26 LAB — URINALYSIS, W/ REFLEX TO CULTURE (INFECTION SUSPECTED)
Bilirubin Urine: NEGATIVE
Glucose, UA: NEGATIVE mg/dL
Hgb urine dipstick: NEGATIVE
Ketones, ur: NEGATIVE mg/dL
Nitrite: NEGATIVE
Protein, ur: NEGATIVE mg/dL
Specific Gravity, Urine: 1.002 — ABNORMAL LOW (ref 1.005–1.030)
pH: 5 (ref 5.0–8.0)

## 2023-05-26 LAB — COMPREHENSIVE METABOLIC PANEL
ALT: 19 U/L (ref 0–44)
AST: 28 U/L (ref 15–41)
Albumin: 2.8 g/dL — ABNORMAL LOW (ref 3.5–5.0)
Alkaline Phosphatase: 59 U/L (ref 38–126)
Anion gap: 7 (ref 5–15)
BUN: 17 mg/dL (ref 8–23)
CO2: 26 mmol/L (ref 22–32)
Calcium: 9.3 mg/dL (ref 8.9–10.3)
Chloride: 108 mmol/L (ref 98–111)
Creatinine, Ser: 1.58 mg/dL — ABNORMAL HIGH (ref 0.44–1.00)
GFR, Estimated: 34 mL/min — ABNORMAL LOW (ref >60)
Glucose, Bld: 95 mg/dL (ref 70–99)
Potassium: 4.3 mmol/L (ref 3.5–5.1)
Sodium: 141 mmol/L (ref 135–145)
Total Bilirubin: 0.4 mg/dL (ref 0.0–1.2)
Total Protein: 6 g/dL — ABNORMAL LOW (ref 6.5–8.1)

## 2023-05-26 LAB — IRON AND TIBC
Iron: 60 ug/dL (ref 28–170)
Saturation Ratios: 22 % (ref 10.4–31.8)
TIBC: 267 ug/dL (ref 250–450)
UIBC: 207 ug/dL

## 2023-05-26 LAB — GLUCOSE, CAPILLARY
Glucose-Capillary: 104 mg/dL — ABNORMAL HIGH (ref 70–99)
Glucose-Capillary: 108 mg/dL — ABNORMAL HIGH (ref 70–99)
Glucose-Capillary: 126 mg/dL — ABNORMAL HIGH (ref 70–99)
Glucose-Capillary: 77 mg/dL (ref 70–99)
Glucose-Capillary: 90 mg/dL (ref 70–99)
Glucose-Capillary: 94 mg/dL (ref 70–99)
Glucose-Capillary: 96 mg/dL (ref 70–99)

## 2023-05-26 LAB — CBC
HCT: 31.9 % — ABNORMAL LOW (ref 36.0–46.0)
Hemoglobin: 10.1 g/dL — ABNORMAL LOW (ref 12.0–15.0)
MCH: 28 pg (ref 26.0–34.0)
MCHC: 31.7 g/dL (ref 30.0–36.0)
MCV: 88.4 fL (ref 80.0–100.0)
Platelets: 159 10*3/uL (ref 150–400)
RBC: 3.61 MIL/uL — ABNORMAL LOW (ref 3.87–5.11)
RDW: 17.7 % — ABNORMAL HIGH (ref 11.5–15.5)
WBC: 5.1 10*3/uL (ref 4.0–10.5)
nRBC: 0 % (ref 0.0–0.2)

## 2023-05-26 LAB — HEMOGLOBIN A1C
Hgb A1c MFr Bld: 5.4 % (ref 4.8–5.6)
Mean Plasma Glucose: 108.28 mg/dL

## 2023-05-26 LAB — SURGICAL PATHOLOGY

## 2023-05-26 LAB — FERRITIN: Ferritin: 64 ng/mL (ref 11–307)

## 2023-05-26 MED ORDER — APIXABAN 2.5 MG PO TABS
2.5000 mg | ORAL_TABLET | Freq: Two times a day (BID) | ORAL | Status: DC
  Administered 2023-05-26 – 2023-05-27 (×2): 2.5 mg via ORAL
  Filled 2023-05-26 (×2): qty 1

## 2023-05-26 MED ORDER — FLUCONAZOLE 200 MG PO TABS
400.0000 mg | ORAL_TABLET | Freq: Every day | ORAL | Status: DC
Start: 2023-05-26 — End: 2023-06-09
  Administered 2023-05-26 – 2023-05-27 (×2): 400 mg via ORAL
  Filled 2023-05-26 (×2): qty 2

## 2023-05-26 MED ORDER — INSULIN ASPART 100 UNIT/ML IJ SOLN: 0.0000 [IU] | Freq: Every day | INTRAMUSCULAR | Status: DC

## 2023-05-26 MED ORDER — INSULIN ASPART 100 UNIT/ML IJ SOLN: 0.0000 [IU] | Freq: Three times a day (TID) | INTRAMUSCULAR | Status: DC

## 2023-05-26 NOTE — TOC Initial Note (Addendum)
Transition of Care [facility]) - Initial/Assessment Note    Patient Details  Name: [name] [name] MRN: [id number] Date of Birth: 1946/04/02  [facility]) CM/SW Contact:    Delilah Shan, LCSWA Phone Number: 05/26/2023, 2:24 PM  Clinical Narrative:                  CSW spoke with patient at bedside. Patient reports PTA she is from [employer]. Patient confirmed plan is to return when medically ready. Patient request for CSW to find out who the social worker is at facility. Patient would like to speak with current social worker there when she returns. CSW informed patient she will get get in touch with facility to request that social worker at facility meet with her when she returns to help assist with questions she has for facility. Patient request to speak ith chaplain to help answer questions she has regarding HCPOA. CSW paged Chaplain,awaiting call back.All questions answered. No further questions reported at this time. CSW called and LVM for Janie with [employer]. CSW awaiting call back. CSW will continue to follow.  Update- CSW received call back from [facility]. Theron Arista confirmed he will stop by to assist with patients questions regarding HCPOA.  Expected Discharge Plan: Skilled Nursing Facility Barriers to Discharge: Continued Medical Work up   Patient Goals and CMS Choice Patient states their goals for this hospitalization and ongoing recovery are:: SNF   Choice offered to / list presented to : Patient      Expected Discharge Plan and Services In-house Referral: Clinical Social Work     Living arrangements for the past 2 months: Skilled Nursing Facility                                      Prior Living Arrangements/Services Living arrangements for the past 2 months: Skilled Nursing Facility Lives with:: Facility Resident Patient language and need for interpreter reviewed:: Yes Do you feel safe going back to the place where you live?: Yes       Need for Family Participation in Patient Care: Yes (Comment) Care giver support system in place?: Yes (comment)   Criminal Activity/Legal Involvement Pertinent to Current Situation/Hospitalization: No - Comment as needed  Activities of Daily Living   ADL Screening (condition at time of admission) Independently performs ADLs?: No Does the patient have a NEW difficulty with bathing/dressing/toileting/self-feeding that is expected to last >3 days?: No Does the patient have a NEW difficulty with getting in/out of bed, walking, or climbing stairs that is expected to last >3 days?: No Does the patient have a NEW difficulty with communication that is expected to last >3 days?: No Is the patient deaf or have difficulty hearing?: Yes Does the patient have difficulty seeing, even when wearing glasses/contacts?: No Does the patient have difficulty concentrating, remembering, or making decisions?: No  Permission Sought/Granted Permission sought to share information with : Case Manager, Family Supports, Oceanographer granted to share information with : Yes, Verbal Permission Granted  Share Information with NAME: [name] [name]  Permission granted to share info w AGENCY: SNF  Permission granted to share info w Relationship: daughter  Permission granted to share info w Contact Information: [name] [name] [phone]  Emotional Assessment Appearance:: Appears stated age Attitude/Demeanor/Rapport: Gracious Affect (typically observed): Calm Orientation: : Oriented to Self, Oriented to Place, Oriented to  Time, Oriented to Situation Alcohol / Substance Use: Not  Applicable Psych Involvement: No (comment)  Admission diagnosis:  GI bleed [K92.2] Upper GI bleed [K92.2] Patient Active Problem List   Diagnosis Date Noted   GI bleed 05/22/2023   Depression 05/22/2023   History of DVT (deep vein thrombosis) 05/22/2023   Pressure injury of skin 03/25/2022   History of duodenal ulcer  03/25/2022   History of esophagitis 03/25/2022   Esophageal dysmotility 03/25/2022   Cervical stenosis of spinal canal 03/25/2022   Malnutrition of moderate degree 03/21/2022   Polyarthralgia    Wernicke's aphasia    Allergic rhinitis 07/28/2019   Postnasal drip 11/16/2017   Sensorineural hearing loss (SNHL), bilateral 11/16/2017   Tinnitus of both ears 11/16/2017   CKD (chronic kidney disease), stage III (HCC) 11/08/2017   Anemia 11/07/2017   Lymphedema 11/07/2017   Routine general medical examination at a health care facility 05/10/2014   Paroxysmal SVT (supraventricular tachycardia) (HCC) 11/28/2013   Physical deconditioning 11/23/2013   Generalized weakness 11/17/2013   Multilevel spine pain 11/17/2013   Aortic stenosis, mild 11/17/2013   DM type 2 (diabetes mellitus, type 2) (HCC) 02/02/2013   Arthritis 10/19/2012   Mixed incontinence 10/19/2012   GERD (gastroesophageal reflux disease) 04/20/2011   HTN (hypertension) 01/20/2011   Morbid obesity (HCC)    PCP:  No primary care provider on file. Pharmacy:   [id number] [place], [place] - [phone] AT [facility] OF [address] [place] [address] Phone: [phone] Fax: [phone]  [facility] [place], [place] - [address] [place] [address] Phone: [phone] Fax: [phone]     Social Drivers of Health (SDOH) Social History: SDOH Screenings   Food Insecurity: No Food Insecurity (05/22/2023)  Housing: Low Risk  (05/23/2023)  Transportation Needs: No Transportation Needs (05/22/2023)  Utilities: Not At Risk (05/22/2023)  Alcohol Screen: Low Risk  (03/03/2022)  Depression (PHQ2-9): Low Risk  (04/21/2022)  Financial Resource Strain: Low Risk  (03/03/2022)  Physical Activity: Inactive (03/03/2022)  Social Connections: Socially Isolated (05/25/2023)  Stress: No Stress Concern Present (03/03/2022)  Tobacco Use: Medium Risk (05/25/2023)    SDOH Interventions:     Readmission Risk Interventions     No data to display

## 2023-05-26 NOTE — Progress Notes (Signed)
Brief GI Path Note:  Esophageal biopsies came back with candida esophagitis. Stomach biopsies showed reactive gastropathy. No H pylori. Will start patient on treatment for candida esophagitis with fluconazole (placed pharmacy consult to help with dosing). Hospitalist team was notified of this.  Eulah Pont, MD

## 2023-05-26 NOTE — Progress Notes (Signed)
Orthopedic Tech Progress Note Patient Details:  [name] [name] 31-Jan-1946 [id number]  Ortho Devices Type of Ortho Device: Radio broadcast assistant Ortho Device/Splint Location: BLE Ortho Device/Splint Interventions: Application   Post Interventions Patient Tolerated: Well  Genelle Bal [name] 05/26/2023, 3:19 PM

## 2023-05-26 NOTE — Progress Notes (Signed)
Pt with positive for esophageal candida based on bx. Consulted for fluconazole dosing. We will use a little higher dose due to her wt and baseline scr <1. Crcl ~74ml/min  Fluconazole 400mg  PO qday [address] PharmD, [place], AAHIVP, CPP Infectious Disease Pharmacist 05/26/2023 4:28 PM

## 2023-05-26 NOTE — Progress Notes (Signed)
Progress Note   Patient: [name] [name] ZOX:[id number] DOB: [birth year] DOA: 05/22/2023     3 DOS: the patient was seen and examined on 05/26/2023   Brief hospital course: Gary Lanahan is a 78 y.o. female with a history of diabetes mellitus type 2, hypertension, GERD, anemia, paroxysmal SVT, CKD stage III, obesity, lymphedema, incontinence, wernicke's aphasia, c-spine stenosis.  Patient presented secondary to black stools concerning for melena from upper GI bleeding. GI consulted. Hospitalization complicated by hypoglycemia with hypothermia and bradycardia.  Assessment and Plan: Black stools Concern for melena from upper GI bleeding. FOBT positive.  -GI consulted and pt underwent upper endoscopy on 2/17. -EGD biopsy pos for esophageal candidiasis, antifungal started per GI -Hgb stable   Hypothermia Unclear etiology, resolved -Did require brief warming blanket. Normalized at this time -No evidence of infectious source. Cortisol borderline low. TSH normal.  -Warming blanket discontinued overnight. -Blood cx neg -Cosyntropin test unremarkable   Diabetes mellitus Currently uncontrolled with hypoglycemia. Patient is managed on glipizide 24 hour tablet as an outpatient.  -required D10 IV fluids, now stopped -Discontinue glipizide on discharge -Cont SSI alone as needed   Bradycardia 1st degree heart block -Possibly related to hypoglycemia.  -Episodes of bradycardia with short pauses noted on telemetry. Patient is on metoprolol as an outpatient which was held on admission. Second degree type 1 heart block developed with rates down to 20s in relation to hypothermia.    Primary hypertension Patient is on metoprolol tartrate as an outpatient.  -Blood pressure is low-normal.  -Metoprolol was held on admission, cont to hold for now   Chronic anemia -Hemoglobin baseline is around 10-11. Stable.   Hypernatremia -Secondary to free water deficit. Resolved with IV fluids.   Chronic  anemia Baseline hemoglobin of 10-11. Hemoglobin is stable.   Paroxysmal SVT Patient is on metoprolol tartrate as an outpatient which was held secondary to bradycardia. -HR remains stable today   History of DVT On Eliquis was an outpatient which is held on admission secondary to GI bleeding. -OK to resume eliquis today per GI   CKD stage IIIa Stable.   Lymphedema Noted. Patient with Unna boots on.   C-spine stenosis Bedbound Per review, patient has not walked in several years. Currently resides at a SNF.   Leg tremors Patient has a history of cervical stenosis with cervical radiculopathy and lumbar stenosis. Patient has been bedbound for several years. No tremors noted on exam, however it is possible this is secondary to general leg weakness.   Obesity Borderline. Estimated body mass index is 30.75 kg/m as calculated from the following:   Height as of this encounter: 5\' 11"  (1.803 m).   Weight as of this encounter: 100 kg.       Subjective: Without complaints  Physical Exam: Vitals:   05/25/23 1935 05/26/23 0418 05/26/23 0735 05/26/23 1435  BP: 98/70 (!) 91/55 (!) 101/56 108/76  Pulse: 78 (!) 56 (!) 56 78  Resp: 19 14 15 16   Temp: (!) 97.5 F (36.4 C) (!) 97.5 F (36.4 C) (!) 97.3 F (36.3 C) 97.7 F (36.5 C)  TempSrc: Oral Oral Oral Oral  SpO2: 99% 99% 99% 99%  Weight:      Height:       General exam: Awake, laying in bed, in nad Respiratory system: Normal respiratory effort, no wheezing Cardiovascular system: regular rate, s1, s2 Gastrointestinal system: Soft, nondistended, positive BS Central nervous system: CN2-12 grossly intact, strength intact Extremities: Perfused, no clubbing Skin: Normal skin turgor, no notable  skin lesions seen Psychiatry: Mood normal // no visual hallucinations   Data Reviewed:  Labs reviewed: Na 141, K 4.3, Cr 1.58, WBC 5.1, Hgb 10.1, Plts 159  Family Communication: Pt in room, family not at bedside  Disposition: Status is:  Inpatient Remains inpatient appropriate because: severity of illness  Planned Discharge Destination: Skilled nursing facility    Author: Rickey Barbara, MD 05/26/2023 5:46 PM  For on call review [email or web address].

## 2023-05-26 NOTE — Progress Notes (Signed)
Pt was on chronic apixaban for a hx of DVT. It has been held since admission to r/o GIB. Ok to resume per GI and TRH today.   Resume apixaban 2.5mg  BID Rx will follow peripherally  Ulyses Southward, PharmD, BCIDP, AAHIVP, CPP Infectious Disease Pharmacist 05/26/2023 6:14 PM

## 2023-05-26 NOTE — Plan of Care (Signed)
   Problem: Coping: Goal: Ability to adjust to condition or change in health will improve Outcome: Progressing

## 2023-05-27 DIAGNOSIS — K922 Gastrointestinal hemorrhage, unspecified: Secondary | ICD-10-CM | POA: Diagnosis not present

## 2023-05-27 LAB — CBC
HCT: 32.4 % — ABNORMAL LOW (ref 36.0–46.0)
Hemoglobin: 10.3 g/dL — ABNORMAL LOW (ref 12.0–15.0)
MCH: 27.7 pg (ref 26.0–34.0)
MCHC: 31.8 g/dL (ref 30.0–36.0)
MCV: 87.1 fL (ref 80.0–100.0)
Platelets: 155 10*3/uL (ref 150–400)
RBC: 3.72 MIL/uL — ABNORMAL LOW (ref 3.87–5.11)
RDW: 17.4 % — ABNORMAL HIGH (ref 11.5–15.5)
WBC: 4.5 10*3/uL (ref 4.0–10.5)
nRBC: 0 % (ref 0.0–0.2)

## 2023-05-27 LAB — COMPREHENSIVE METABOLIC PANEL
ALT: 19 U/L (ref 0–44)
AST: 27 U/L (ref 15–41)
Albumin: 2.8 g/dL — ABNORMAL LOW (ref 3.5–5.0)
Alkaline Phosphatase: 71 U/L (ref 38–126)
Anion gap: 8 (ref 5–15)
BUN: 19 mg/dL (ref 8–23)
CO2: 28 mmol/L (ref 22–32)
Calcium: 9.7 mg/dL (ref 8.9–10.3)
Chloride: 108 mmol/L (ref 98–111)
Creatinine, Ser: 1.31 mg/dL — ABNORMAL HIGH (ref 0.44–1.00)
GFR, Estimated: 42 mL/min — ABNORMAL LOW (ref >60)
Glucose, Bld: 82 mg/dL (ref 70–99)
Potassium: 4.9 mmol/L (ref 3.5–5.1)
Sodium: 144 mmol/L (ref 135–145)
Total Bilirubin: 0.5 mg/dL (ref 0.0–1.2)
Total Protein: 5.9 g/dL — ABNORMAL LOW (ref 6.5–8.1)

## 2023-05-27 LAB — GLUCOSE, CAPILLARY
Glucose-Capillary: 71 mg/dL (ref 70–99)
Glucose-Capillary: 73 mg/dL (ref 70–99)
Glucose-Capillary: 97 mg/dL (ref 70–99)
Glucose-Capillary: 108 mg/dL — ABNORMAL HIGH (ref 70–99)

## 2023-05-27 MED ORDER — CEPHALEXIN 500 MG PO CAPS: 500.0000 mg | ORAL_CAPSULE | Freq: Two times a day (BID) | ORAL | 0 refills | Status: AC

## 2023-05-27 MED ORDER — FLUCONAZOLE 200 MG PO TABS: 400.0000 mg | ORAL_TABLET | Freq: Every day | ORAL | 0 refills | Status: AC

## 2023-05-27 MED ORDER — METOPROLOL TARTRATE 25 MG PO TABS: 12.5000 mg | ORAL_TABLET | Freq: Two times a day (BID) | ORAL | 0 refills | Status: DC

## 2023-05-27 MED ORDER — PANTOPRAZOLE SODIUM 40 MG PO TBEC
40.0000 mg | DELAYED_RELEASE_TABLET | Freq: Two times a day (BID) | ORAL | 0 refills | Status: AC
Start: 2023-05-27 — End: 2023-08-24

## 2023-05-27 MED ORDER — METOPROLOL TARTRATE 12.5 MG HALF TABLET
12.5000 mg | ORAL_TABLET | Freq: Two times a day (BID) | ORAL | Status: DC
Start: 2023-05-27 — End: 2023-05-27
  Administered 2023-05-27: 12.5 mg via ORAL
  Filled 2023-05-27: qty 1

## 2023-05-27 MED ORDER — CEPHALEXIN 500 MG PO CAPS
500.0000 mg | ORAL_CAPSULE | Freq: Two times a day (BID) | ORAL | Status: DC
  Filled 2023-05-27: qty 1

## 2023-05-27 NOTE — TOC Progression Note (Signed)
Transition of Care [facility]) - Progression Note    Patient Details  Name: [name] [name] MRN: [id number] Date of Birth: 1945-12-29  Transition [facility] Care [facility]) CM/SW Contact  Delilah Shan, LCSWA Phone Number: 05/27/2023, 1:06 PM  Clinical Narrative:     Patient has SN[facility] bed at [facility] place LTC when medically ready. CSW will continue to follow.  Expected Discharge Plan: Skilled Nursing Facility Barriers to Discharge: Continued Medical Work up  Expected Discharge Plan and Services In-house Referral: Clinical Social Work     Living arrangements for the past 2 months: Skilled Nursing Facility                                       Social Determinants of Health (SDOH) Interventions SDOH Screenings   Food Insecurity: No Food Insecurity (05/22/2023)  Housing: Low Risk  (05/23/2023)  Transportation Needs: No Transportation Needs (05/22/2023)  Utilities: Not At Risk (05/22/2023)  Alcohol Screen: Low Risk  (03/03/2022)  Depression (PHQ2-9): Low Risk  (04/21/2022)  Financial Resource Strain: Low Risk  (03/03/2022)  Physical Activity: Inactive (03/03/2022)  Social Connections: Socially Isolated (05/25/2023)  Stress: No Stress Concern Present (03/03/2022)  Tobacco Use: Medium Risk (05/25/2023)    Readmission Risk Interventions     No data to display

## 2023-05-27 NOTE — TOC Transition Note (Signed)
Transition of Care [facility]) - Discharge Note   Patient Details  Name: [name] [name] MRN: [id number] Date of Birth: [birth year]  [facility] CM/SW Contact:  Delilah Shan, LCSWA Phone Number: 05/27/2023, 4:45 PM   Clinical Narrative:     Patient will DC to: [facility]   Anticipated DC date: 05/27/2023  Family notified: CSW unable to reach patients daughter Jeani Sow. Patient gave CSW permission to call her cousin Grant Ruts [phone].   Transport by: Sharin Mons  ?  Per MD patient ready for DC to [facility] . RN, patient, patient's family, and facility notified of DC. Discharge Summary sent to facility. RN given number for report [phone] RM# [id number]. DC packet on chart. Ambulance transport requested for patient.  CSW signing off.   Final next level of care: Skilled Nursing Facility Barriers to Discharge: No Barriers Identified   Patient Goals and CMS Choice Patient states their goals for this hospitalization and ongoing recovery are:: SNF   Choice offered to / list presented to : Patient      Discharge Placement              Patient chooses bed at:  [facility]) Patient to be transferred to facility by: [facility] Name of family member notified: [name] Patient and family notified of of transfer: 05/27/23  Discharge Plan and Services Additional resources added to the After Visit Summary for   In-house Referral: Clinical Social Work                                   Social Drivers of Health (SDOH) Interventions SDOH Screenings   Food Insecurity: No Food Insecurity (05/22/2023)  Housing: Low Risk  (05/23/2023)  Transportation Needs: No Transportation Needs (05/22/2023)  Utilities: Not At Risk (05/22/2023)  Alcohol Screen: Low Risk  (03/03/2022)  Depression (PHQ2-9): Low Risk  (04/21/2022)  Financial Resource Strain: Low Risk  (03/03/2022)  Physical Activity: Inactive (03/03/2022)  Social Connections: Socially Isolated (05/25/2023)   Stress: No Stress Concern Present (03/03/2022)  Tobacco Use: Medium Risk (05/25/2023)     Readmission Risk Interventions     No data to display

## 2023-05-27 NOTE — Plan of Care (Signed)
   Problem: Coping: Goal: Ability to adjust to condition or change in health will improve Outcome: Progressing

## 2023-05-27 NOTE — Progress Notes (Signed)
   05/27/23 1255  Spiritual Encounters  Type of Visit Follow up  Care provided to: Patient  Reason for visit Advance directives   Chaplain responded to Spiritual Consult for Advance Care Directive.  Chaplain arrived in room and delivered ACD education. Instructed Pt how to reach out to Spiritual Care if they have any questions and to contact us when they are ready to move forward.  Pt states she will call her daughter.  Pt wanted number her daughter could call to ask questions about HCPOA.  Chaplain provided number to the [facility].  Daughter may call.  Chaplain services remain available by Spiritual Consult or for emergent cases, paging [phone]  Chaplain Raelene Bott, MDiv [email or web address] [email or web address] [phone]

## 2023-05-27 NOTE — Progress Notes (Signed)
   05/27/23 1022  Spiritual Encounters  Type of Visit Initial  Conversation partners present during encounter Nurse  Reason for visit Advance directives   Chaplain attempted to visit with Pt to address HCPOA questions that came up with Social Worker yesterday.  Pt was about to receive a bath, therefore, chaplain will return at another time.  Chaplain services remain available by Spiritual Consult or for emergent cases, paging [phone]  Chaplain Raelene Bott, MDiv Kaidence [email or web address] [phone]

## 2023-05-27 NOTE — [place] FL2 (Signed)
[place] MEDICAID FL2 LEVEL OF CARE FORM     IDENTIFICATION  Patient Name: [name] [name] Birthdate: March 11, 1946 Sex: female Admission Date (Current Location): 05/22/2023  [facility] and [place] Number:  Producer, television/film/video and Address:  [facility], [address], [place], [place] [address]      Provider Number: [id number]  Attending Physician Name and Address:  Jerald Kief, MD  Relative Name and Phone Number:  Jeani Sow (daughter) [phone]    Current Level of Care: Hospital Recommended Level of Care: Skilled Nursing Facility Prior Approval Number:    Date Approved/Denied:   PASRR Number: [id number]  Discharge Plan: SNF    Current Diagnoses: Patient Active Problem List   Diagnosis Date Noted   GI bleed 05/22/2023   Depression 05/22/2023   History of DVT (deep vein thrombosis) 05/22/2023   Pressure injury of skin 03/25/2022   History of duodenal ulcer 03/25/2022   History of esophagitis 03/25/2022   Esophageal dysmotility 03/25/2022   Cervical stenosis of spinal canal 03/25/2022   Malnutrition of moderate degree 03/21/2022   Polyarthralgia    Wernicke's aphasia    Allergic rhinitis 07/28/2019   Postnasal drip 11/16/2017   Sensorineural hearing loss (SNHL), bilateral 11/16/2017   Tinnitus of both ears 11/16/2017   CKD (chronic kidney disease), stage III (HCC) 11/08/2017   Anemia 11/07/2017   Lymphedema 11/07/2017   Routine general medical examination at a health care facility 05/10/2014   Paroxysmal SVT (supraventricular tachycardia) (HCC) 11/28/2013   Physical deconditioning 11/23/2013   Generalized weakness 11/17/2013   Multilevel spine pain 11/17/2013   Aortic stenosis, mild 11/17/2013   DM type 2 (diabetes mellitus, type 2) (HCC) 02/02/2013   Arthritis 10/19/2012   Mixed incontinence 10/19/2012   GERD (gastroesophageal reflux disease) 04/20/2011   HTN (hypertension) 01/20/2011   Morbid obesity (HCC)     Orientation  RESPIRATION BLADDER Height & Weight     Self, Time, Situation, Place  Normal Incontinent, External catheter (External Urinary Catheter) Weight: 220 lb 7.4 oz (100 kg) Height:  5\' 11"  (180.3 cm)  BEHAVIORAL SYMPTOMS/MOOD NEUROLOGICAL BOWEL NUTRITION STATUS      Incontinent Diet (Please see discharge summary)  AMBULATORY STATUS COMMUNICATION OF NEEDS Skin     Verbally Other (Comment) (Abrasion,Abdomen,chest,,Bil.,scratch marks,Abdomen,chest,Wound/Incision LDAs,PI toe,anterior,L,Wound/Incision open or dehisced,toe,anterior,R)                       Personal Care Assistance Level of Assistance              Functional Limitations Info  Sight, Hearing, Speech Sight Info:  Financial trader) Hearing Info: Impaired Speech Info: Adequate    SPECIAL CARE FACTORS FREQUENCY                       Contractures Contractures Info: Not present    Additional Factors Info  Code Status, Allergies, Psychotropic, Insulin Sliding Scale Code Status Info: FULL Allergies Info: Roxicodone (oxycodone),Penicillins Psychotropic Info: buPROPion (WELLBUTRIN) tablet 75 mg 3 times daily Insulin Sliding Scale Info: insulin aspart (novoLOG) injection 0-5 Units daily at bedtime,insulin aspart (novoLOG) injection 0-6 Units 3 times daily with meals       Current Medications (05/27/2023):  This is the current hospital active medication list Current Facility-Administered Medications  Medication Dose Route Frequency Provider Last Rate Last Admin   acetaminophen (TYLENOL) tablet 650 mg  650 mg Oral Q6H PRN Synetta Fail, MD   650 mg at 05/24/23 1040  Or   acetaminophen (TYLENOL) suppository 650 mg  650 mg Rectal Q6H PRN Synetta Fail, MD       albuterol (PROVENTIL) (2.5 MG/3ML) 0.083% nebulizer solution 3 mL  3 mL Inhalation Q6H PRN Synetta Fail, MD       apixaban Everlene Balls) tablet 2.5 mg  2.5 mg Oral BID Pham, Minh Q, RPH-CPP   2.5 mg at 05/27/23 0909   buPROPion [facility]) tablet 75 mg   75 mg Oral TID Synetta Fail, MD   75 mg at 05/23/23 1604   cycloSPORINE (RESTASIS) 0.05 % ophthalmic emulsion 1 drop  1 drop Both Eyes BID Synetta Fail, MD   1 drop at 05/27/23 0908   feeding supplement (ENSURE ENLIVE / ENSURE PLUS) liquid 237 mL  237 mL Oral BID BM Synetta Fail, MD   237 mL at 05/27/23 0913   fluconazole (DIFLUCAN) tablet 400 mg  400 mg Oral Q supper Pham, Minh Q, RPH-CPP   400 mg at 05/26/23 1803   HYDROcodone-acetaminophen (NORCO/VICODIN) 5-325 MG per tablet 1-2 tablet  1-2 tablet Oral Q4H PRN Narda Bonds, MD       hydroxypropyl methylcellulose / hypromellose (ISOPTO TEARS / GONIOVISC) 2.5 % ophthalmic solution 1 drop  1 drop Both Eyes QID PRN Synetta Fail, MD       insulin aspart (novoLOG) injection 0-5 Units  0-5 Units Subcutaneous QHS Jerald Kief, MD       insulin aspart (novoLOG) injection 0-6 Units  0-6 Units Subcutaneous TID WC Jerald Kief, MD       Oral care mouth rinse  15 mL Mouth Rinse PRN Narda Bonds, MD       pantoprazole (PROTONIX) injection 40 mg  40 mg Intravenous Q12H Synetta Fail, MD   40 mg at 05/27/23 1610   sodium chloride flush (NS) 0.9 % injection 3 mL  3 mL Intravenous Q12H Synetta Fail, MD   3 mL at 05/27/23 9604     Discharge Medications: Please see discharge summary for a list of discharge medications.  Relevant Imaging Results:  Relevant Lab Results:   Additional Information [id number]  Delilah Shan, LCSWA

## 2023-05-27 NOTE — Discharge Summary (Addendum)
Physician Discharge Summary   Patient: [name] [name] MRN: [id number] DOB: [birth year]  Admit date:     05/22/2023  Discharge date: 05/27/23  Discharge Physician: Rickey Barbara   PCP: No primary care provider on file.   Recommendations at discharge:    Follow up with PCP in 1-2 weeks Please note oral diabetes medications were stopped this visit because of profound hypoglycemia and metoprolol was decreased to 12.5mg  because of bradycardia  Discharge Diagnoses: Principal Problem:   GI bleed Active Problems:   Morbid obesity (HCC)   HTN (hypertension)   GERD (gastroesophageal reflux disease)   DM type 2 (diabetes mellitus, type 2) (HCC)   Paroxysmal SVT (supraventricular tachycardia) (HCC)   Anemia   Lymphedema   CKD (chronic kidney disease), stage III (HCC)   Cervical stenosis of spinal canal   Depression   History of DVT (deep vein thrombosis)  Resolved Problems:   * No resolved hospital problems. *  Hospital Course: [name] [name] is a 78 y.o. female with a history of diabetes mellitus type 2, hypertension, GERD, anemia, paroxysmal SVT, CKD stage III, obesity, lymphedema, incontinence, wernicke's aphasia, c-spine stenosis.  Patient presented secondary to black stools concerning for melena from upper GI bleeding. GI consulted. Hospitalization complicated by hypoglycemia with hypothermia and bradycardia.  Assessment and Plan: Black stools secondary to reactive gastritis with candida esophagitis Concern for melena from upper GI bleeding. FOBT positive.  -GI consulted and pt underwent upper endoscopy on 2/17. -EGD biopsy pos for esophageal candidiasis, antifungal started per GI, plan to treat for total 14 days -GI recs to continue BID PPI x 8 weeks -Hgb remained stable   Hypothermia Unclear etiology, resolved -Did require brief warming blanket. Normalized at this time -No evidence of infectious source. Cortisol borderline low. TSH normal.  -Warming blanket discontinued  later and pt since remained stable -Blood cx neg -Cosyntropin test unremarkable   Diabetes mellitus Had episode of uncontrolled with hypoglycemia. Patient is managed on glipizide 24 hour tablet as an outpatient.  -required D10 IV fluids, now stopped -Discontinue glipizide on discharge   Bradycardia 1st degree heart block -Possibly related to hypoglycemia.  -Episodes of bradycardia with short pauses noted on telemetry. Patient is on metoprolol as an outpatient which was held on admission. Second degree type 1 heart block developed with rates down to 20s in relation to hypothermia.  -Metoprolol dose decreased to 12.5mg  bid on d/c   Primary hypertension Patient is on metoprolol tartrate as an outpatient.  -Blood pressure is low-normal.  -Metoprolol was held on admission, dose reduced to 12.5mg  bid   Chronic anemia -Hemoglobin baseline is around 10-11. Stable. -Iron levels reviewed, are within normal   Hypernatremia -Secondary to free water deficit. Resolved with IV fluids.   Paroxysmal SVT Patient is on metoprolol tartrate as an outpatient which was held secondary to bradycardia. -Dose reduced to 12.5mg  bid on d/c   History of DVT On Eliquis was an outpatient which is held on admission secondary to GI bleeding. -OK to resume eliquis per GI   CKD stage IIIa Stable.   Lymphedema Noted. Patient with Unna boots on.   C-spine stenosis Bedbound Per review, patient has not walked in several years. Currently resides at a SNF.   Leg tremors Patient has a history of cervical stenosis with cervical radiculopathy and lumbar stenosis. Patient has been bedbound for several years. No tremors noted on exam, however it is possible this is secondary to general leg weakness.   Obesity Borderline. Estimated body mass index  is 30.75 kg/m as calculated from the following:   Height as of this encounter: 5\' 11"  (1.803 m).   Weight as of this encounter: 100 kg.        Consultants:  GI Procedures performed: EGD  Disposition: Skilled nursing facility Diet recommendation:  Regular diet DISCHARGE MEDICATION: Allergies as of 05/27/2023       Reactions   Roxicodone [oxycodone] Nausea And Vomiting   Penicillins Itching, Other (See Comments)   Remote reaction, no anaphylaxis or severe cutaneous symptoms Tolerates Cefepime        Medication List     STOP taking these medications    Caladryl 1-8 % Lotn Generic drug: diphenhydramine-calamine   colchicine 0.6 MG tablet   ferrous sulfate 325 (65 FE) MG tablet   glipiZIDE 2.5 MG 24 hr tablet Commonly known as: GLUCOTROL XL   guaifenesin 400 MG Tabs tablet Commonly known as: HUMIBID E   hydrocortisone 1 % lotion   ketotifen 0.035 % ophthalmic solution Commonly known as: ZADITOR   LINIMENTS EX   loperamide 2 MG tablet Commonly known as: IMODIUM A-D   loratadine 10 MG tablet Commonly known as: CLARITIN   magnesium hydroxide 400 MG/5ML suspension Commonly known as: MILK OF MAGNESIA   meclizine 25 MG tablet Commonly known as: ANTIVERT   melatonin 3 MG Tabs tablet   Menthol-Camphor 5.1-5.1 % Oint   methocarbamol 500 MG tablet Commonly known as: ROBAXIN   montelukast 10 MG tablet Commonly known as: SINGULAIR   multivitamin with minerals Tabs tablet   omeprazole 40 MG capsule Commonly known as: PRILOSEC   ondansetron 4 MG tablet Commonly known as: ZOFRAN   oseltamivir 75 MG capsule Commonly known as: TAMIFLU   promethazine 25 MG suppository Commonly known as: Phenergan   sodium chloride 0.65 % Soln nasal spray Commonly known as: OCEAN   sucralfate 1 GM/10ML suspension Commonly known as: CARAFATE   thiamine 100 MG tablet Commonly known as: VITAMIN B1       TAKE these medications    acetaminophen 500 MG tablet Commonly known as: TYLENOL Take 1,000 mg by mouth every 8 (eight) hours as needed for mild pain or moderate pain.   albuterol 108 (90 Base) MCG/ACT inhaler Commonly  known as: VENTOLIN HFA Inhale 2 puffs into the lungs every 6 (six) hours as needed for wheezing or shortness of breath.   apixaban 2.5 MG Tabs tablet Commonly known as: ELIQUIS Take by mouth 2 (two) times daily.   benzocaine 10 % mucosal gel Commonly known as: ORAJEL Use as directed 1 application  in the mouth or throat every 6 (six) hours as needed (gum pain, irritation).   buPROPion 75 MG tablet Commonly known as: WELLBUTRIN Take 75 mg by mouth 3 (three) times daily.   cephALEXin 500 MG capsule Commonly known as: KEFLEX Take 1 capsule (500 mg total) by mouth every 12 (twelve) hours for 3 days.   cycloSPORINE 0.05 % ophthalmic emulsion Commonly known as: RESTASIS Place 1 drop into both eyes 2 (two) times daily.   diclofenac Sodium 1 % Gel Commonly known as: VOLTAREN Apply 4 g topically every 8 (eight) hours. To right hip   ENSURE PO Take 237 mLs by mouth 2 (two) times daily.   fluconazole 200 MG tablet Commonly known as: DIFLUCAN Take 2 tablets (400 mg total) by mouth daily with supper for 13 days.   folic acid 1 MG tablet Commonly known as: FOLVITE Take 1 mg by mouth daily.   hydroxypropyl methylcellulose /  hypromellose 2.5 % ophthalmic solution Commonly known as: ISOPTO TEARS / GONIOVISC Place 1 drop into both eyes every 6 (six) hours as needed for dry eyes.   metoprolol tartrate 25 MG tablet Commonly known as: LOPRESSOR Take 0.5 tablets (12.5 mg total) by mouth 2 (two) times daily. What changed: how much to take   pantoprazole 40 MG tablet Commonly known as: Protonix Take 1 tablet (40 mg total) by mouth 2 (two) times daily.   PRESERVISION AREDS 2 PO Take 1 capsule by mouth 2 (two) times daily.   trolamine salicylate 10 % cream Commonly known as: ASPERCREME Apply 1 Application topically every 6 (six) hours as needed (right hand pain).   ZINC OXIDE EX Apply 1 application  topically 3 (three) times daily. To buttocks, every shift.        Follow-up  Information     Follow up with PCPi n 1-2 weeks Follow up.   Why: Hospital follow up               Discharge Exam: Filed Weights   05/22/23 1649  Weight: 100 kg   General exam: Awake, laying in bed, in nad Respiratory system: Normal respiratory effort, no wheezing Cardiovascular system: regular rate, s1, s2 Gastrointestinal system: Soft, nondistended, positive BS Central nervous system: CN2-12 grossly intact, strength intact, BLE general weakness Extremities: Perfused, no clubbing Skin: Normal skin turgor, no notable skin lesions seen Psychiatry: Mood normal // no visual hallucinations   Condition at discharge: fair  The results of significant diagnostics from this hospitalization (including imaging, microbiology, ancillary and laboratory) are listed below for reference.   Imaging Studies: No results found.  Microbiology: Results for orders placed or performed during the hospital encounter of 05/22/23  Culture, blood (Routine X 2) w Reflex to ID Panel     Status: None (Preliminary result)   Collection Time: 05/24/23  9:30 AM   Specimen: BLOOD LEFT ARM  Result Value Ref Range Status   Specimen Description BLOOD LEFT ARM  Final   Special Requests   Final    BOTTLES DRAWN AEROBIC ONLY Blood Culture results may not be optimal due to an inadequate volume of blood received in culture bottles   Culture   Final    NO GROWTH 3 DAYS Performed at [facility], [address] [place], [place] [address]    Report Status PENDING  Incomplete  Culture, blood (Routine X 2) w Reflex to ID Panel     Status: None (Preliminary result)   Collection Time: 05/24/23  9:30 AM   Specimen: BLOOD LEFT ARM  Result Value Ref Range Status   Specimen Description BLOOD LEFT ARM  Final   Special Requests   Final    BOTTLES DRAWN AEROBIC ONLY Blood Culture results may not be optimal due to an inadequate volume of blood received in culture bottles   Culture   Final    NO GROWTH 3  DAYS Performed at [facility], [address], [place], [place] [address]    Report Status PENDING  Incomplete    Labs: CBC: Recent Labs  Lab 05/22/23 1700 05/22/23 2120 05/23/23 0838 05/24/23 0539 05/25/23 0742 05/26/23 0359 05/27/23 0332  WBC 4.2   < > 3.2* 3.4* 3.5* 5.1 4.5  NEUTROABS 2.4  --   --   --   --   --   --   HGB 11.6*   < > 10.0* 9.9* 10.3* 10.1* 10.3*  HCT 36.8   < >  31.8* 31.6* 32.7* 31.9* 32.4*  MCV 87.8   < > 86.2 87.8 87.0 88.4 87.1  PLT 249   < > 211 183 187 159 155   < > = values in this interval not displayed.   Basic Metabolic Panel: Recent Labs  Lab 05/22/23 1700 05/23/23 0838 05/24/23 0539 05/24/23 1533 05/26/23 0359 05/27/23 0332  NA 148* 147* 142  --  141 144  K 4.9 4.2 4.4  --  4.3 4.9  CL 111 112* 110  --  108 108  CO2 26 26 25   --  26 28  GLUCOSE 61* 60* 89  --  95 82  BUN 18 16 19   --  17 19  CREATININE 1.11* 1.00 1.05*  --  1.58* 1.31*  CALCIUM 10.1 9.5 9.3  --  9.3 9.7  MG  --   --   --  1.7  --   --    Liver Function Tests: Recent Labs  Lab 05/22/23 1700 05/23/23 0838 05/26/23 0359 05/27/23 0332  AST 27 24 28 27   ALT 21 17 19 19   ALKPHOS 73 55 59 71  BILITOT 0.3 0.4 0.4 0.5  PROT 7.0 6.0* 6.0* 5.9*  ALBUMIN 3.4* 2.8* 2.8* 2.8*   CBG: Recent Labs  Lab 05/26/23 2326 05/27/23 0340 05/27/23 0753 05/27/23 1224 05/27/23 1608  GLUCAP 77 71 73 97 108*    Discharge time spent: less than 30 minutes.  Signed: Rickey Barbara, MD [facility] 05/27/2023

## 2023-05-27 NOTE — Telephone Encounter (Signed)
Second attempt called patient per provider request  regarding schedule no answer left message on voice mail to call office back.

## 2023-05-27 NOTE — Progress Notes (Signed)
DISCHARGE NOTE SNF Donn[facility] Silba to be discharged Skilled nursing facility [facility]  per MD order. Patient verbalized understanding.  Skin clean, dry and intact without evidence of skin break down, no evidence of skin tears noted. IV catheter discontinued intact. Site without signs and symptoms of complications. Dressing and pressure applied. Pt denies pain at the site currently. No complaints noted.  Patient free of lines, drains, and wounds.   Discharge packet assembled. An After Visit Summary (AVS) was printed and given to the EMS personnel. Patient escorted via stretcher and discharged to [facility] via ambulance. Report called to accepting facility; all questions and concerns addressed.   Lorine Bears, RN

## 2023-05-28 ENCOUNTER — Encounter (HOSPITAL_COMMUNITY): Payer: Self-pay | Admitting: Internal Medicine

## 2023-05-29 LAB — CULTURE, BLOOD (ROUTINE X 2)
Culture: NO GROWTH
Culture: NO GROWTH

## 2023-05-31 LAB — PROINSULIN/INSULIN RATIO
Insulin: 6.4 u[IU]/mL
Proinsulin: 5.9 pmol/L

## 2023-06-05 DIAGNOSIS — K219 Gastro-esophageal reflux disease without esophagitis: Secondary | ICD-10-CM | POA: Diagnosis not present

## 2023-06-05 DIAGNOSIS — I350 Nonrheumatic aortic (valve) stenosis: Secondary | ICD-10-CM | POA: Diagnosis not present

## 2023-06-05 DIAGNOSIS — B3781 Candidal esophagitis: Secondary | ICD-10-CM | POA: Diagnosis not present

## 2023-06-05 DIAGNOSIS — I10 Essential (primary) hypertension: Secondary | ICD-10-CM | POA: Diagnosis not present

## 2023-06-05 DIAGNOSIS — E119 Type 2 diabetes mellitus without complications: Secondary | ICD-10-CM | POA: Diagnosis not present

## 2023-06-05 DIAGNOSIS — M6281 Muscle weakness (generalized): Secondary | ICD-10-CM | POA: Diagnosis not present

## 2023-06-14 DIAGNOSIS — B351 Tinea unguium: Secondary | ICD-10-CM | POA: Diagnosis not present

## 2023-06-14 DIAGNOSIS — E1159 Type 2 diabetes mellitus with other circulatory complications: Secondary | ICD-10-CM | POA: Diagnosis not present

## 2023-06-15 DIAGNOSIS — I10 Essential (primary) hypertension: Secondary | ICD-10-CM | POA: Diagnosis not present

## 2023-06-15 DIAGNOSIS — N390 Urinary tract infection, site not specified: Secondary | ICD-10-CM | POA: Diagnosis not present

## 2023-06-18 DIAGNOSIS — D649 Anemia, unspecified: Secondary | ICD-10-CM | POA: Diagnosis not present

## 2023-06-18 DIAGNOSIS — N390 Urinary tract infection, site not specified: Secondary | ICD-10-CM | POA: Diagnosis not present

## 2023-06-18 DIAGNOSIS — N1831 Chronic kidney disease, stage 3a: Secondary | ICD-10-CM | POA: Diagnosis not present

## 2023-06-22 DIAGNOSIS — N1831 Chronic kidney disease, stage 3a: Secondary | ICD-10-CM | POA: Diagnosis not present

## 2023-06-22 DIAGNOSIS — K219 Gastro-esophageal reflux disease without esophagitis: Secondary | ICD-10-CM | POA: Diagnosis not present

## 2023-06-22 DIAGNOSIS — I82409 Acute embolism and thrombosis of unspecified deep veins of unspecified lower extremity: Secondary | ICD-10-CM | POA: Diagnosis not present

## 2023-08-19 ENCOUNTER — Telehealth: Payer: Self-pay | Admitting: Diagnostic Neuroimaging

## 2023-08-19 NOTE — Telephone Encounter (Signed)
 Appointment details confirmed

## 2023-08-24 ENCOUNTER — Encounter: Payer: Self-pay | Admitting: Diagnostic Neuroimaging

## 2023-08-24 ENCOUNTER — Ambulatory Visit (INDEPENDENT_AMBULATORY_CARE_PROVIDER_SITE_OTHER): Payer: PPO | Admitting: Diagnostic Neuroimaging

## 2023-08-24 VITALS — BP 122/72 | HR 61

## 2023-08-24 DIAGNOSIS — R443 Hallucinations, unspecified: Secondary | ICD-10-CM

## 2023-08-24 DIAGNOSIS — M4802 Spinal stenosis, cervical region: Secondary | ICD-10-CM

## 2023-08-24 DIAGNOSIS — M48062 Spinal stenosis, lumbar region with neurogenic claudication: Secondary | ICD-10-CM | POA: Diagnosis not present

## 2023-08-24 DIAGNOSIS — F8081 Childhood onset fluency disorder: Secondary | ICD-10-CM | POA: Diagnosis not present

## 2023-08-24 DIAGNOSIS — E512 Wernicke's encephalopathy: Secondary | ICD-10-CM

## 2023-08-24 DIAGNOSIS — R269 Unspecified abnormalities of gait and mobility: Secondary | ICD-10-CM | POA: Diagnosis not present

## 2023-08-24 NOTE — Patient Instructions (Addendum)
  Stuttering, visual changes --> History of WERNICKE'S ENCEPHALOPATHY (in 2021, due to intractable nausea vomiting and poor p.o. intake; was doing better with thiamine  supplementation; now in SNF and not on vitamin supplements) - restart multivitamin daily; check labs; follow up with PCP  CERVICAL SPINAL STENOSIS / CERVICAL RADICULOPATHY - C3-4 cord compression; patient not interested in surgery at this time (prior NSGY evaluation recommended surgery, but patient declined due to age, fraility) - continue supportive care and PT / OT exercises [facility])  LUMBAR SPINAL STENOSIS - chronic since 2008; continue conservative mgmt
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
## 2023-08-25 DIAGNOSIS — B3781 Candidal esophagitis: Secondary | ICD-10-CM | POA: Diagnosis not present

## 2023-08-25 DIAGNOSIS — M6281 Muscle weakness (generalized): Secondary | ICD-10-CM | POA: Diagnosis not present

## 2023-08-26 DIAGNOSIS — H5203 Hypermetropia, bilateral: Secondary | ICD-10-CM | POA: Diagnosis not present

## 2023-08-26 DIAGNOSIS — B3781 Candidal esophagitis: Secondary | ICD-10-CM | POA: Diagnosis not present

## 2023-08-26 DIAGNOSIS — H25813 Combined forms of age-related cataract, bilateral: Secondary | ICD-10-CM | POA: Diagnosis not present

## 2023-08-26 DIAGNOSIS — H04123 Dry eye syndrome of bilateral lacrimal glands: Secondary | ICD-10-CM | POA: Diagnosis not present

## 2023-08-26 DIAGNOSIS — H1045 Other chronic allergic conjunctivitis: Secondary | ICD-10-CM | POA: Diagnosis not present

## 2023-08-26 DIAGNOSIS — M6281 Muscle weakness (generalized): Secondary | ICD-10-CM | POA: Diagnosis not present

## 2023-08-27 DIAGNOSIS — E1159 Type 2 diabetes mellitus with other circulatory complications: Secondary | ICD-10-CM | POA: Diagnosis not present

## 2023-08-27 DIAGNOSIS — M6281 Muscle weakness (generalized): Secondary | ICD-10-CM | POA: Diagnosis not present

## 2023-08-27 DIAGNOSIS — B351 Tinea unguium: Secondary | ICD-10-CM | POA: Diagnosis not present

## 2023-08-27 DIAGNOSIS — B3781 Candidal esophagitis: Secondary | ICD-10-CM | POA: Diagnosis not present

## 2023-08-28 DIAGNOSIS — B3781 Candidal esophagitis: Secondary | ICD-10-CM | POA: Diagnosis not present

## 2023-08-28 DIAGNOSIS — M6281 Muscle weakness (generalized): Secondary | ICD-10-CM | POA: Diagnosis not present

## 2023-08-28 LAB — VITAMIN B12: Vitamin B-12: 564 pg/mL (ref 232–1245)

## 2023-08-28 LAB — VITAMIN B1: Thiamine: 101.6 nmol/L (ref 66.5–200.0)

## 2023-08-28 LAB — VITAMIN E
Vitamin E (Alpha Tocopherol): 10.5 mg/L (ref 9.0–29.0)
Vitamin E(Gamma Tocopherol): 0.8 mg/L (ref 0.5–4.9)

## 2023-08-29 DIAGNOSIS — M6281 Muscle weakness (generalized): Secondary | ICD-10-CM | POA: Diagnosis not present

## 2023-08-29 DIAGNOSIS — B3781 Candidal esophagitis: Secondary | ICD-10-CM | POA: Diagnosis not present

## 2023-09-01 DIAGNOSIS — N1831 Chronic kidney disease, stage 3a: Secondary | ICD-10-CM | POA: Diagnosis not present

## 2023-09-01 DIAGNOSIS — K219 Gastro-esophageal reflux disease without esophagitis: Secondary | ICD-10-CM | POA: Diagnosis not present

## 2023-09-01 DIAGNOSIS — E119 Type 2 diabetes mellitus without complications: Secondary | ICD-10-CM | POA: Diagnosis not present

## 2023-09-01 DIAGNOSIS — I82409 Acute embolism and thrombosis of unspecified deep veins of unspecified lower extremity: Secondary | ICD-10-CM | POA: Diagnosis not present

## 2023-09-01 DIAGNOSIS — I10 Essential (primary) hypertension: Secondary | ICD-10-CM | POA: Diagnosis not present

## 2023-09-01 DIAGNOSIS — D649 Anemia, unspecified: Secondary | ICD-10-CM | POA: Diagnosis not present

## 2023-09-01 DIAGNOSIS — R5381 Other malaise: Secondary | ICD-10-CM | POA: Diagnosis not present

## 2023-09-02 DIAGNOSIS — B3781 Candidal esophagitis: Secondary | ICD-10-CM | POA: Diagnosis not present

## 2023-09-02 DIAGNOSIS — M6281 Muscle weakness (generalized): Secondary | ICD-10-CM | POA: Diagnosis not present

## 2023-09-03 DIAGNOSIS — B3781 Candidal esophagitis: Secondary | ICD-10-CM | POA: Diagnosis not present

## 2023-09-03 DIAGNOSIS — M6281 Muscle weakness (generalized): Secondary | ICD-10-CM | POA: Diagnosis not present

## 2023-09-06 DIAGNOSIS — Z79899 Other long term (current) drug therapy: Secondary | ICD-10-CM | POA: Diagnosis not present

## 2023-09-07 DIAGNOSIS — D649 Anemia, unspecified: Secondary | ICD-10-CM | POA: Diagnosis not present

## 2023-09-07 DIAGNOSIS — M6281 Muscle weakness (generalized): Secondary | ICD-10-CM | POA: Diagnosis not present

## 2023-09-07 DIAGNOSIS — B3781 Candidal esophagitis: Secondary | ICD-10-CM | POA: Diagnosis not present

## 2023-09-07 DIAGNOSIS — K649 Unspecified hemorrhoids: Secondary | ICD-10-CM | POA: Diagnosis not present

## 2023-09-08 ENCOUNTER — Ambulatory Visit: Payer: Self-pay | Admitting: Diagnostic Neuroimaging

## 2023-09-09 DIAGNOSIS — M6281 Muscle weakness (generalized): Secondary | ICD-10-CM | POA: Diagnosis not present

## 2023-09-09 DIAGNOSIS — B3781 Candidal esophagitis: Secondary | ICD-10-CM | POA: Diagnosis not present

## 2023-09-10 ENCOUNTER — Ambulatory Visit: Payer: Self-pay
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
## 2023-09-13 DIAGNOSIS — H25813 Combined forms of age-related cataract, bilateral: Secondary | ICD-10-CM | POA: Diagnosis not present

## 2023-09-13 DIAGNOSIS — H11823 Conjunctivochalasis, bilateral: Secondary | ICD-10-CM | POA: Diagnosis not present

## 2023-09-13 DIAGNOSIS — H1045 Other chronic allergic conjunctivitis: Secondary | ICD-10-CM | POA: Diagnosis not present

## 2023-09-13 DIAGNOSIS — H353131 Nonexudative age-related macular degeneration, bilateral, early dry stage: Secondary | ICD-10-CM | POA: Diagnosis not present

## 2023-09-14 DIAGNOSIS — M6281 Muscle weakness (generalized): Secondary | ICD-10-CM | POA: Diagnosis not present

## 2023-09-14 DIAGNOSIS — B3781 Candidal esophagitis: Secondary | ICD-10-CM | POA: Diagnosis not present

## 2023-09-16 DIAGNOSIS — M6281 Muscle weakness (generalized): Secondary | ICD-10-CM | POA: Diagnosis not present

## 2023-09-16 DIAGNOSIS — B3781 Candidal esophagitis: Secondary | ICD-10-CM | POA: Diagnosis not present

## 2023-09-18 DIAGNOSIS — B3781 Candidal esophagitis: Secondary | ICD-10-CM | POA: Diagnosis not present

## 2023-09-18 DIAGNOSIS — M6281 Muscle weakness (generalized): Secondary | ICD-10-CM | POA: Diagnosis not present

## 2023-09-21 DIAGNOSIS — M6281 Muscle weakness (generalized): Secondary | ICD-10-CM | POA: Diagnosis not present

## 2023-09-21 DIAGNOSIS — B3781 Candidal esophagitis: Secondary | ICD-10-CM | POA: Diagnosis not present

## 2023-09-23 DIAGNOSIS — B3781 Candidal esophagitis: Secondary | ICD-10-CM | POA: Diagnosis not present

## 2023-09-23 DIAGNOSIS — M6281 Muscle weakness (generalized): Secondary | ICD-10-CM | POA: Diagnosis not present

## 2023-09-24 DIAGNOSIS — M6281 Muscle weakness (generalized): Secondary | ICD-10-CM | POA: Diagnosis not present

## 2023-09-24 DIAGNOSIS — B3781 Candidal esophagitis: Secondary | ICD-10-CM | POA: Diagnosis not present

## 2023-09-28 DIAGNOSIS — M6281 Muscle weakness (generalized): Secondary | ICD-10-CM | POA: Diagnosis not present

## 2023-09-28 DIAGNOSIS — B3781 Candidal esophagitis: Secondary | ICD-10-CM | POA: Diagnosis not present

## 2023-09-30 DIAGNOSIS — M6281 Muscle weakness (generalized): Secondary | ICD-10-CM | POA: Diagnosis not present

## 2023-09-30 DIAGNOSIS — B3781 Candidal esophagitis: Secondary | ICD-10-CM | POA: Diagnosis not present

## 2023-10-02 DIAGNOSIS — M6281 Muscle weakness (generalized): Secondary | ICD-10-CM | POA: Diagnosis not present

## 2023-10-02 DIAGNOSIS — B3781 Candidal esophagitis: Secondary | ICD-10-CM | POA: Diagnosis not present

## 2023-10-07 DIAGNOSIS — M6281 Muscle weakness (generalized): Secondary | ICD-10-CM | POA: Diagnosis not present

## 2023-10-07 DIAGNOSIS — B3781 Candidal esophagitis: Secondary | ICD-10-CM | POA: Diagnosis not present

## 2023-10-10 DIAGNOSIS — M6281 Muscle weakness (generalized): Secondary | ICD-10-CM | POA: Diagnosis not present

## 2023-10-10 DIAGNOSIS — B3781 Candidal esophagitis: Secondary | ICD-10-CM | POA: Diagnosis not present

## 2023-10-11 DIAGNOSIS — B3781 Candidal esophagitis: Secondary | ICD-10-CM | POA: Diagnosis not present

## 2023-10-11 DIAGNOSIS — M6281 Muscle weakness (generalized): Secondary | ICD-10-CM | POA: Diagnosis not present

## 2023-10-27 DIAGNOSIS — I82409 Acute embolism and thrombosis of unspecified deep veins of unspecified lower extremity: Secondary | ICD-10-CM | POA: Diagnosis not present

## 2023-10-27 DIAGNOSIS — E119 Type 2 diabetes mellitus without complications: Secondary | ICD-10-CM | POA: Diagnosis not present

## 2023-10-27 DIAGNOSIS — N1831 Chronic kidney disease, stage 3a: Secondary | ICD-10-CM | POA: Diagnosis not present

## 2023-10-27 DIAGNOSIS — B3781 Candidal esophagitis: Secondary | ICD-10-CM | POA: Diagnosis not present

## 2023-10-27 DIAGNOSIS — R5381 Other malaise: Secondary | ICD-10-CM | POA: Diagnosis not present

## 2023-10-27 DIAGNOSIS — M1990 Unspecified osteoarthritis, unspecified site: Secondary | ICD-10-CM | POA: Diagnosis not present

## 2023-10-27 DIAGNOSIS — E512 Wernicke's encephalopathy: Secondary | ICD-10-CM | POA: Diagnosis not present

## 2023-10-28 DIAGNOSIS — E119 Type 2 diabetes mellitus without complications: Secondary | ICD-10-CM | POA: Diagnosis not present

## 2023-11-04 DIAGNOSIS — I509 Heart failure, unspecified: Secondary | ICD-10-CM | POA: Diagnosis not present

## 2023-11-04 DIAGNOSIS — E119 Type 2 diabetes mellitus without complications: Secondary | ICD-10-CM | POA: Diagnosis not present

## 2023-11-04 DIAGNOSIS — E559 Vitamin D deficiency, unspecified: Secondary | ICD-10-CM | POA: Diagnosis not present

## 2023-11-06 DIAGNOSIS — N390 Urinary tract infection, site not specified: Secondary | ICD-10-CM | POA: Diagnosis not present

## 2023-11-08 DIAGNOSIS — N390 Urinary tract infection, site not specified: Secondary | ICD-10-CM | POA: Diagnosis not present

## 2023-11-08 DIAGNOSIS — E119 Type 2 diabetes mellitus without complications: Secondary | ICD-10-CM | POA: Diagnosis not present

## 2023-11-08 DIAGNOSIS — M6281 Muscle weakness (generalized): Secondary | ICD-10-CM | POA: Diagnosis not present

## 2023-11-08 DIAGNOSIS — R1310 Dysphagia, unspecified: Secondary | ICD-10-CM | POA: Diagnosis not present

## 2023-11-09 DIAGNOSIS — M6281 Muscle weakness (generalized): Secondary | ICD-10-CM | POA: Diagnosis not present

## 2023-11-09 DIAGNOSIS — E119 Type 2 diabetes mellitus without complications: Secondary | ICD-10-CM | POA: Diagnosis not present

## 2023-11-09 DIAGNOSIS — R1310 Dysphagia, unspecified: Secondary | ICD-10-CM | POA: Diagnosis not present

## 2023-11-11 DIAGNOSIS — E119 Type 2 diabetes mellitus without complications: Secondary | ICD-10-CM | POA: Diagnosis not present

## 2023-11-11 DIAGNOSIS — R1310 Dysphagia, unspecified: Secondary | ICD-10-CM | POA: Diagnosis not present

## 2023-11-12 DIAGNOSIS — E119 Type 2 diabetes mellitus without complications: Secondary | ICD-10-CM | POA: Diagnosis not present

## 2023-11-12 DIAGNOSIS — R1310 Dysphagia, unspecified: Secondary | ICD-10-CM | POA: Diagnosis not present

## 2023-11-15 DIAGNOSIS — R1310 Dysphagia, unspecified: Secondary | ICD-10-CM | POA: Diagnosis not present

## 2023-11-15 DIAGNOSIS — E119 Type 2 diabetes mellitus without complications: Secondary | ICD-10-CM | POA: Diagnosis not present

## 2023-11-16 DIAGNOSIS — E119 Type 2 diabetes mellitus without complications: Secondary | ICD-10-CM | POA: Diagnosis not present

## 2023-11-16 DIAGNOSIS — R1310 Dysphagia, unspecified: Secondary | ICD-10-CM | POA: Diagnosis not present

## 2023-11-17 DIAGNOSIS — E119 Type 2 diabetes mellitus without complications: Secondary | ICD-10-CM | POA: Diagnosis not present

## 2023-11-17 DIAGNOSIS — M6281 Muscle weakness (generalized): Secondary | ICD-10-CM | POA: Diagnosis not present

## 2023-11-17 DIAGNOSIS — R1310 Dysphagia, unspecified: Secondary | ICD-10-CM | POA: Diagnosis not present

## 2023-11-18 DIAGNOSIS — R1310 Dysphagia, unspecified: Secondary | ICD-10-CM | POA: Diagnosis not present

## 2023-11-18 DIAGNOSIS — E119 Type 2 diabetes mellitus without complications: Secondary | ICD-10-CM | POA: Diagnosis not present

## 2023-11-19 DIAGNOSIS — R1310 Dysphagia, unspecified: Secondary | ICD-10-CM | POA: Diagnosis not present

## 2023-11-19 DIAGNOSIS — M6281 Muscle weakness (generalized): Secondary | ICD-10-CM | POA: Diagnosis not present

## 2023-11-19 DIAGNOSIS — E119 Type 2 diabetes mellitus without complications: Secondary | ICD-10-CM | POA: Diagnosis not present

## 2023-11-20 DIAGNOSIS — R1310 Dysphagia, unspecified: Secondary | ICD-10-CM | POA: Diagnosis not present

## 2023-11-20 DIAGNOSIS — E119 Type 2 diabetes mellitus without complications: Secondary | ICD-10-CM | POA: Diagnosis not present

## 2023-11-22 DIAGNOSIS — R1310 Dysphagia, unspecified: Secondary | ICD-10-CM | POA: Diagnosis not present

## 2023-11-22 DIAGNOSIS — E119 Type 2 diabetes mellitus without complications: Secondary | ICD-10-CM | POA: Diagnosis not present

## 2023-11-23 DIAGNOSIS — H6121 Impacted cerumen, right ear: Secondary | ICD-10-CM | POA: Diagnosis not present

## 2023-11-23 DIAGNOSIS — R490 Dysphonia: Secondary | ICD-10-CM | POA: Diagnosis not present

## 2023-11-23 DIAGNOSIS — E119 Type 2 diabetes mellitus without complications: Secondary | ICD-10-CM | POA: Diagnosis not present

## 2023-11-23 DIAGNOSIS — J3489 Other specified disorders of nose and nasal sinuses: Secondary | ICD-10-CM | POA: Diagnosis not present

## 2023-11-23 DIAGNOSIS — M6281 Muscle weakness (generalized): Secondary | ICD-10-CM | POA: Diagnosis not present

## 2023-11-23 DIAGNOSIS — R1310 Dysphagia, unspecified: Secondary | ICD-10-CM | POA: Diagnosis not present

## 2023-11-24 DIAGNOSIS — R1310 Dysphagia, unspecified: Secondary | ICD-10-CM | POA: Diagnosis not present

## 2023-11-24 DIAGNOSIS — E119 Type 2 diabetes mellitus without complications: Secondary | ICD-10-CM | POA: Diagnosis not present

## 2023-11-26 DIAGNOSIS — E119 Type 2 diabetes mellitus without complications: Secondary | ICD-10-CM | POA: Diagnosis not present

## 2023-11-26 DIAGNOSIS — R1310 Dysphagia, unspecified: Secondary | ICD-10-CM | POA: Diagnosis not present

## 2023-11-26 DIAGNOSIS — M6281 Muscle weakness (generalized): Secondary | ICD-10-CM | POA: Diagnosis not present

## 2023-11-27 DIAGNOSIS — E119 Type 2 diabetes mellitus without complications: Secondary | ICD-10-CM | POA: Diagnosis not present

## 2023-11-27 DIAGNOSIS — R1310 Dysphagia, unspecified: Secondary | ICD-10-CM | POA: Diagnosis not present

## 2023-11-29 DIAGNOSIS — E119 Type 2 diabetes mellitus without complications: Secondary | ICD-10-CM | POA: Diagnosis not present

## 2023-11-29 DIAGNOSIS — R1310 Dysphagia, unspecified: Secondary | ICD-10-CM | POA: Diagnosis not present

## 2023-12-01 DIAGNOSIS — R1310 Dysphagia, unspecified: Secondary | ICD-10-CM | POA: Diagnosis not present

## 2023-12-01 DIAGNOSIS — E119 Type 2 diabetes mellitus without complications: Secondary | ICD-10-CM | POA: Diagnosis not present

## 2023-12-01 DIAGNOSIS — M6281 Muscle weakness (generalized): Secondary | ICD-10-CM | POA: Diagnosis not present

## 2023-12-02 DIAGNOSIS — E119 Type 2 diabetes mellitus without complications: Secondary | ICD-10-CM | POA: Diagnosis not present

## 2023-12-02 DIAGNOSIS — R1310 Dysphagia, unspecified: Secondary | ICD-10-CM | POA: Diagnosis not present

## 2023-12-03 DIAGNOSIS — R1310 Dysphagia, unspecified: Secondary | ICD-10-CM | POA: Diagnosis not present

## 2023-12-03 DIAGNOSIS — M6281 Muscle weakness (generalized): Secondary | ICD-10-CM | POA: Diagnosis not present

## 2023-12-03 DIAGNOSIS — E119 Type 2 diabetes mellitus without complications: Secondary | ICD-10-CM | POA: Diagnosis not present

## 2023-12-04 DIAGNOSIS — E119 Type 2 diabetes mellitus without complications: Secondary | ICD-10-CM | POA: Diagnosis not present

## 2023-12-04 DIAGNOSIS — R1310 Dysphagia, unspecified: Secondary | ICD-10-CM | POA: Diagnosis not present

## 2023-12-04 DIAGNOSIS — M6281 Muscle weakness (generalized): Secondary | ICD-10-CM | POA: Diagnosis not present

## 2023-12-07 DIAGNOSIS — E119 Type 2 diabetes mellitus without complications: Secondary | ICD-10-CM | POA: Diagnosis not present

## 2023-12-07 DIAGNOSIS — M6281 Muscle weakness (generalized): Secondary | ICD-10-CM | POA: Diagnosis not present

## 2023-12-07 DIAGNOSIS — R1310 Dysphagia, unspecified: Secondary | ICD-10-CM | POA: Diagnosis not present

## 2023-12-08 DIAGNOSIS — M6281 Muscle weakness (generalized): Secondary | ICD-10-CM | POA: Diagnosis not present

## 2023-12-08 DIAGNOSIS — R1310 Dysphagia, unspecified: Secondary | ICD-10-CM | POA: Diagnosis not present

## 2023-12-08 DIAGNOSIS — E119 Type 2 diabetes mellitus without complications: Secondary | ICD-10-CM | POA: Diagnosis not present

## 2023-12-09 DIAGNOSIS — M6281 Muscle weakness (generalized): Secondary | ICD-10-CM | POA: Diagnosis not present

## 2023-12-09 DIAGNOSIS — E119 Type 2 diabetes mellitus without complications: Secondary | ICD-10-CM | POA: Diagnosis not present

## 2023-12-09 DIAGNOSIS — R1310 Dysphagia, unspecified: Secondary | ICD-10-CM | POA: Diagnosis not present

## 2023-12-10 DIAGNOSIS — M6281 Muscle weakness (generalized): Secondary | ICD-10-CM | POA: Diagnosis not present

## 2023-12-10 DIAGNOSIS — R1310 Dysphagia, unspecified: Secondary | ICD-10-CM | POA: Diagnosis not present

## 2023-12-10 DIAGNOSIS — E119 Type 2 diabetes mellitus without complications: Secondary | ICD-10-CM | POA: Diagnosis not present

## 2023-12-11 DIAGNOSIS — E119 Type 2 diabetes mellitus without complications: Secondary | ICD-10-CM | POA: Diagnosis not present

## 2023-12-11 DIAGNOSIS — M6281 Muscle weakness (generalized): Secondary | ICD-10-CM | POA: Diagnosis not present

## 2023-12-11 DIAGNOSIS — R1310 Dysphagia, unspecified: Secondary | ICD-10-CM | POA: Diagnosis not present

## 2023-12-14 DIAGNOSIS — M6281 Muscle weakness (generalized): Secondary | ICD-10-CM | POA: Diagnosis not present

## 2023-12-14 DIAGNOSIS — E119 Type 2 diabetes mellitus without complications: Secondary | ICD-10-CM | POA: Diagnosis not present

## 2023-12-14 DIAGNOSIS — R1310 Dysphagia, unspecified: Secondary | ICD-10-CM | POA: Diagnosis not present

## 2023-12-15 DIAGNOSIS — M6281 Muscle weakness (generalized): Secondary | ICD-10-CM | POA: Diagnosis not present

## 2023-12-15 DIAGNOSIS — R1310 Dysphagia, unspecified: Secondary | ICD-10-CM | POA: Diagnosis not present

## 2023-12-15 DIAGNOSIS — E119 Type 2 diabetes mellitus without complications: Secondary | ICD-10-CM | POA: Diagnosis not present

## 2023-12-16 DIAGNOSIS — M6281 Muscle weakness (generalized): Secondary | ICD-10-CM | POA: Diagnosis not present

## 2023-12-16 DIAGNOSIS — R1310 Dysphagia, unspecified: Secondary | ICD-10-CM | POA: Diagnosis not present

## 2023-12-16 DIAGNOSIS — E119 Type 2 diabetes mellitus without complications: Secondary | ICD-10-CM | POA: Diagnosis not present

## 2023-12-17 DIAGNOSIS — E119 Type 2 diabetes mellitus without complications: Secondary | ICD-10-CM | POA: Diagnosis not present

## 2023-12-17 DIAGNOSIS — R1310 Dysphagia, unspecified: Secondary | ICD-10-CM | POA: Diagnosis not present

## 2023-12-17 DIAGNOSIS — M6281 Muscle weakness (generalized): Secondary | ICD-10-CM | POA: Diagnosis not present

## 2023-12-18 DIAGNOSIS — R1310 Dysphagia, unspecified: Secondary | ICD-10-CM | POA: Diagnosis not present

## 2023-12-18 DIAGNOSIS — M6281 Muscle weakness (generalized): Secondary | ICD-10-CM | POA: Diagnosis not present

## 2023-12-18 DIAGNOSIS — E119 Type 2 diabetes mellitus without complications: Secondary | ICD-10-CM | POA: Diagnosis not present

## 2023-12-21 DIAGNOSIS — M6281 Muscle weakness (generalized): Secondary | ICD-10-CM | POA: Diagnosis not present

## 2023-12-21 DIAGNOSIS — R1310 Dysphagia, unspecified: Secondary | ICD-10-CM | POA: Diagnosis not present

## 2023-12-21 DIAGNOSIS — E119 Type 2 diabetes mellitus without complications: Secondary | ICD-10-CM | POA: Diagnosis not present

## 2023-12-22 DIAGNOSIS — R1310 Dysphagia, unspecified: Secondary | ICD-10-CM | POA: Diagnosis not present

## 2023-12-22 DIAGNOSIS — M6281 Muscle weakness (generalized): Secondary | ICD-10-CM | POA: Diagnosis not present

## 2023-12-22 DIAGNOSIS — E119 Type 2 diabetes mellitus without complications: Secondary | ICD-10-CM | POA: Diagnosis not present

## 2023-12-23 DIAGNOSIS — M6281 Muscle weakness (generalized): Secondary | ICD-10-CM | POA: Diagnosis not present

## 2023-12-23 DIAGNOSIS — E119 Type 2 diabetes mellitus without complications: Secondary | ICD-10-CM | POA: Diagnosis not present

## 2023-12-23 DIAGNOSIS — R1310 Dysphagia, unspecified: Secondary | ICD-10-CM | POA: Diagnosis not present

## 2023-12-24 DIAGNOSIS — E119 Type 2 diabetes mellitus without complications: Secondary | ICD-10-CM | POA: Diagnosis not present

## 2023-12-24 DIAGNOSIS — M6281 Muscle weakness (generalized): Secondary | ICD-10-CM | POA: Diagnosis not present

## 2023-12-24 DIAGNOSIS — R1310 Dysphagia, unspecified: Secondary | ICD-10-CM | POA: Diagnosis not present

## 2023-12-25 DIAGNOSIS — R1310 Dysphagia, unspecified: Secondary | ICD-10-CM | POA: Diagnosis not present

## 2023-12-25 DIAGNOSIS — M6281 Muscle weakness (generalized): Secondary | ICD-10-CM | POA: Diagnosis not present

## 2023-12-25 DIAGNOSIS — E119 Type 2 diabetes mellitus without complications: Secondary | ICD-10-CM | POA: Diagnosis not present

## 2023-12-26 ENCOUNTER — Emergency Department (HOSPITAL_COMMUNITY)
Admission: EM | Admit: 2023-12-26 | Discharge: 2023-12-26 | Disposition: A | Discharge status: Skilled Nursing Facility | Attending: Emergency Medicine | Admitting: Emergency Medicine

## 2023-12-26 ENCOUNTER — Other Ambulatory Visit: Payer: Self-pay

## 2023-12-26 DIAGNOSIS — Z7901 Long term (current) use of anticoagulants: Secondary | ICD-10-CM | POA: Insufficient documentation

## 2023-12-26 DIAGNOSIS — R531 Weakness: Secondary | ICD-10-CM | POA: Insufficient documentation

## 2023-12-26 DIAGNOSIS — R251 Tremor, unspecified: Secondary | ICD-10-CM | POA: Insufficient documentation

## 2023-12-26 DIAGNOSIS — R6889 Other general symptoms and signs: Secondary | ICD-10-CM | POA: Diagnosis not present

## 2023-12-26 DIAGNOSIS — Z743 Need for continuous supervision: Secondary | ICD-10-CM | POA: Diagnosis not present

## 2023-12-26 DIAGNOSIS — Z7401 Bed confinement status: Secondary | ICD-10-CM | POA: Diagnosis not present

## 2023-12-26 LAB — COMPREHENSIVE METABOLIC PANEL WITH GFR
ALT: 17 U/L (ref 0–44)
AST: 26 U/L (ref 15–41)
Albumin: 3.8 g/dL (ref 3.5–5.0)
Alkaline Phosphatase: 112 U/L (ref 38–126)
Anion gap: 11 (ref 5–15)
BUN: 14 mg/dL (ref 8–23)
CO2: 28 mmol/L (ref 22–32)
Calcium: 10 mg/dL (ref 8.9–10.3)
Chloride: 107 mmol/L (ref 98–111)
Creatinine, Ser: 0.85 mg/dL (ref 0.44–1.00)
GFR, Estimated: >60 mL/min (ref >60)
Glucose, Bld: 110 mg/dL — ABNORMAL HIGH (ref 70–99)
Potassium: 3.4 mmol/L — ABNORMAL LOW (ref 3.5–5.1)
Sodium: 145 mmol/L (ref 135–145)
Total Bilirubin: 0.5 mg/dL (ref 0.0–1.2)
Total Protein: 7.6 g/dL (ref 6.5–8.1)

## 2023-12-26 LAB — CBC
HCT: 35.4 % — ABNORMAL LOW (ref 36.0–46.0)
Hemoglobin: 11 g/dL — ABNORMAL LOW (ref 12.0–15.0)
MCH: 26.4 pg (ref 26.0–34.0)
MCHC: 31.1 g/dL (ref 30.0–36.0)
MCV: 84.9 fL (ref 80.0–100.0)
Platelets: 205 K/uL (ref 150–400)
RBC: 4.17 MIL/uL (ref 3.87–5.11)
RDW: 18.6 % — ABNORMAL HIGH (ref 11.5–15.5)
WBC: 4.6 K/uL (ref 4.0–10.5)
nRBC: 0 % (ref 0.0–0.2)

## 2023-12-26 LAB — FOLATE: Folate: 18.9 ng/mL (ref >5.9)

## 2023-12-26 LAB — VITAMIN B12: Vitamin B-12: 822 pg/mL (ref 180–914)

## 2023-12-26 LAB — MAGNESIUM: Magnesium: 2 mg/dL (ref 1.7–2.4)

## 2023-12-26 NOTE — ED Provider Notes (Signed)
 [place] EMERGENCY DEPARTMENT AT [facility] Provider Note   CSN: [id number] Arrival date & time: 12/26/23  1425     Patient presents with: Weakness   [name] [name] is a 78 y.o. female.   HPI Patient has multiple complaints.  She reports over the past month or so she has gotten increasingly weak and unsteady.  She reports that she used to be able to transfer herself from her bed to her wheelchair.  She reports now someone has to stand behind her and hold the wheelchair and steady her to make sure she does not fall.  Patient reports she feels like she gets weakness in the hips and they get start to tremble when she puts weight on her legs.  She feels like her feet are wet or slippery.  Patient reports she started to get a lot more tremulous over about the past month or more.  Patient reports since she has been at the nursing home she feels like her ability to walk and transition to wheelchair have decreased.  Patient reports she has had a longstanding diagnosis of lymphedema and she gets routine leg wraps.  She reports last time they were checked she was told there was not any swelling.  Patient has clean well-placed appearing gauze and Ace wrap's on both legs.  The toes are normal in appearance.  She reports she feels like the dressings are wet or her feet are wet.  Objectively though they are not.  Patient also reports she has a feeling of pain in soreness in her throat.  She has been seen by ENT for this not too long ago and had laryngoscopy with normal appearing cords.  Patient reports she also suffers from dry eyes.  Patient reports she knows that she was told she had Warnicke's encephalopathy but was not sure if she should expect that this is always just going to be getting worse.  EMR review indicates patient was diagnosed in 2021 with Warnicke's due to poor oral intake and saw some improvement on thiamine .  Her recent visit to Dr. Alfonse indicates recommendation to restart  MV.    Prior to Admission medications   Medication Sig Start Date End Date Taking? Authorizing Provider  acetaminophen  (TYLENOL ) 500 MG tablet Take 1,000 mg by mouth every 8 (eight) hours as needed for mild pain or moderate pain.    [provider]  albuterol  (VENTOLIN  HFA) 108 (90 Base) MCG/ACT inhaler Inhale 2 puffs into the lungs every 6 (six) hours as needed for wheezing or shortness of breath. 09/17/20   [provider]  apixaban  (ELIQUIS ) 2.5 MG TABS tablet Take by mouth 2 (two) times daily.    [provider]  cycloSPORINE  (RESTASIS ) 0.05 % ophthalmic emulsion Place 1 drop into both eyes 2 (two) times daily.    [provider]  diclofenac  Sodium (VOLTAREN ) 1 % GEL Apply 4 g topically every 8 (eight) hours. To right hip    [provider]  folic acid  (FOLVITE ) 1 MG tablet Take 1 mg by mouth daily.    [provider]  hydroxypropyl methylcellulose / hypromellose (ISOPTO TEARS / GONIOVISC) 2.5 % ophthalmic solution Place 1 drop into both eyes every 6 (six) hours as needed for dry eyes.    [provider]  hydrOXYzine  (ATARAX ) 25 MG tablet Take 25 mg by mouth 3 (three) times daily as needed.    [provider]  Multiple Vitamins-Minerals (PRESERVISION AREDS 2 PO) Take 1 capsule by mouth 2 (  two) times daily.    [provider]  pantoprazole  (PROTONIX ) 40 MG tablet Take 1 tablet (40 mg total) by mouth 2 (two) times daily. 05/27/23 08/24/23  Cindy Garnette POUR, MD  trolamine salicylate (ASPERCREME) 10 % cream Apply 1 Application topically every 6 (six) hours as needed (right hand pain).    [provider]  ZINC  OXIDE EX Apply 1 application  topically 3 (three) times daily. To buttocks, every shift.    [provider]    Allergies: Roxicodone  [oxycodone ] and Penicillins    Review of Systems  Updated Vital Signs BP 127/78   Pulse (!) 55   Temp (!) 97.4 F (36.3 C) (Axillary)   Resp 16   SpO2 98%    Physical Exam Constitutional:      Comments: Alert.  No acute distress.  No respiratory distress.  HENT:     Head: Normocephalic and atraumatic.     Mouth/Throat:     Mouth: Mucous membranes are moist.     Pharynx: Oropharynx is clear.     Comments: Posterior oropharynx widely patent Eyes:     Extraocular Movements: Extraocular movements intact.  Cardiovascular:     Rate and Rhythm: Normal rate and regular rhythm.  Pulmonary:     Effort: Pulmonary effort is normal.     Breath sounds: Normal breath sounds.  Abdominal:     General: There is no distension.     Palpations: Abdomen is soft.     Tenderness: There is no abdominal tenderness. There is no guarding.  Musculoskeletal:     Comments: Patient has gauze and Ace wrap's on both lower extremities.  These are clean and fresh in appearance.  Toes and heels are exposed.  The condition is good.  Toes are warm and dry.  No appearance of any significant edema or erythema above or below the dressings.  Dressings are clean dry without any apparent active drainage.  Skin:    General: Skin is warm and dry.  Neurological:     Comments: She is alert.  Speech is situationally appropriate.  She is generally weak.  It takes significant time for her to place both hands on the side rails of the stretcher to pull herself forward in the sitting upright position.  There is no focal dysfunction.  Patient seems to have some more large tremor of the upper extremities but this did not extinguish.  This was not persistent.  This time patient has not been transition to standing or ambulating.  By report, baseline is transition with assistance to wheelchair.  Psychiatric:        Mood and Affect: Mood normal.     (all labs ordered are listed, but only abnormal results are displayed) Labs Reviewed  COMPREHENSIVE METABOLIC PANEL WITH GFR - Abnormal; Notable for the following components:      Result Value   Potassium 3.4 (*)    Glucose, Bld 110 (*)    All  other components within normal limits  CBC - Abnormal; Notable for the following components:   Hemoglobin 11.0 (*)    HCT 35.4 (*)    RDW 18.6 (*)    All other components within normal limits  MAGNESIUM   VITAMIN B12  FOLATE  VITAMIN C  VITAMIN B6  VITAMIN B1    EKG: None  Radiology: No results found.   Procedures   Medications Ordered in the ED - No data to display  Medical Decision Making Amount and/or Complexity of Data Reviewed Labs: ordered.  Presents as outlined.  She has had chronic problems with weakness and gait dysfunction.  It does sound that this has been fairly incremental.  Will proceed with diagnostic evaluation to rule out electrolyte derangement, anemia, vitamin deficiency.  In 2021 patient did get a diagnosis of Wernicke's from intractable vomiting and was repleted with thiamine .  Patient also has some dementia but is situationally appropriate and aware.  Vitamin B12 822 folate 18.9 magnesium  2.0 comprehensive metabolic panel normal except potassium 3.4 GFR greater than 60 white count 4.6 H&H 11 and 35 which are high for the patient's baseline platelets 205  At this time, patient is clinically stable.  Vital signs are stable.  Patient has had a fairly long course of weakness, gait instability and multiple associated symptoms.  At this time after extensive review of EMR and diagnostic evaluation, I do not find patient has acute conditions needing admission.  Clinically patient is nontoxic and well in appearance.  Physical exam shows chronic lower extremity lymphedema but appears well-managed at this time.  Patient has some weakness and incoordination but by history and review this has been an indolent process and I do not find an acute etiology.  This time I do feel patient stable for discharge with continued follow-up for her remaining lab values for vitamin C, thiamine  and B6.      Final diagnoses:  Generalized weakness   Tremulousness    ED Discharge Orders     None          Armenta Canning, MD 12/26/23 1901

## 2023-12-26 NOTE — Discharge Instructions (Addendum)
 1.  Some lab work was done that we will not be complete before you leave the emergency department.  Your doctor needs to follow-up on this for you.  You had vitamin levels checked for vitamin B1, B6 and vitamin C.  You had results come back for NORMAL folate, B12 and magnesium  levels.  Your potassium is just slightly to the low side.  You can increase some dietary potassium.  Otherwise your sodium, calcium, liver function and blood counts are normal. 2.  Is important that you continue to follow-up with your neurologist.  At one time you had a diagnosis of Wernicke's encephalopathy.  I have rechecked your thiamine  level (B1 vitamin) this may not result for another day.  This result needs to be followed up with your family doctor or primary provider as soon as results are available.  Continue to watch for this in your MyChart account. 3.  Continue to eat a healthy broad diet and take a multivitamin supplement.

## 2023-12-26 NOTE — ED Triage Notes (Signed)
 Pt BIB [facility] from [facility] c/o more weakness than normal. Feels like things are moving when they are not moving. Dementia, oriented to baseline. Chronic shoulder pain  134/78 98% RA Hr 76

## 2023-12-28 DIAGNOSIS — R1310 Dysphagia, unspecified: Secondary | ICD-10-CM | POA: Diagnosis not present

## 2023-12-28 DIAGNOSIS — M6281 Muscle weakness (generalized): Secondary | ICD-10-CM | POA: Diagnosis not present

## 2023-12-28 DIAGNOSIS — E119 Type 2 diabetes mellitus without complications: Secondary | ICD-10-CM | POA: Diagnosis not present

## 2023-12-29 ENCOUNTER — Other Ambulatory Visit (HOSPITAL_COMMUNITY): Payer: Self-pay | Admitting: Otolaryngology

## 2023-12-29 DIAGNOSIS — R1310 Dysphagia, unspecified: Secondary | ICD-10-CM

## 2023-12-29 DIAGNOSIS — R059 Cough, unspecified: Secondary | ICD-10-CM

## 2023-12-29 LAB — VITAMIN C: Vitamin C: 0.6 mg/dL (ref 0.4–2.0)

## 2023-12-30 DIAGNOSIS — M6281 Muscle weakness (generalized): Secondary | ICD-10-CM | POA: Diagnosis not present

## 2023-12-30 DIAGNOSIS — E119 Type 2 diabetes mellitus without complications: Secondary | ICD-10-CM | POA: Diagnosis not present

## 2023-12-30 DIAGNOSIS — R1310 Dysphagia, unspecified: Secondary | ICD-10-CM | POA: Diagnosis not present

## 2023-12-30 LAB — VITAMIN B1: Vitamin B1 (Thiamine): 77.9 nmol/L (ref 66.5–200.0)

## 2023-12-31 DIAGNOSIS — E119 Type 2 diabetes mellitus without complications: Secondary | ICD-10-CM | POA: Diagnosis not present

## 2023-12-31 DIAGNOSIS — R1310 Dysphagia, unspecified: Secondary | ICD-10-CM | POA: Diagnosis not present

## 2023-12-31 DIAGNOSIS — M6281 Muscle weakness (generalized): Secondary | ICD-10-CM | POA: Diagnosis not present

## 2023-12-31 LAB — VITAMIN B6: Vitamin B6: 2 ug/L — ABNORMAL LOW (ref 3.4–65.2)

## 2024-01-01 DIAGNOSIS — M6281 Muscle weakness (generalized): Secondary | ICD-10-CM | POA: Diagnosis not present

## 2024-01-01 DIAGNOSIS — R1310 Dysphagia, unspecified: Secondary | ICD-10-CM | POA: Diagnosis not present

## 2024-01-01 DIAGNOSIS — E119 Type 2 diabetes mellitus without complications: Secondary | ICD-10-CM | POA: Diagnosis not present

## 2024-01-04 DIAGNOSIS — M6281 Muscle weakness (generalized): Secondary | ICD-10-CM | POA: Diagnosis not present

## 2024-01-04 DIAGNOSIS — E119 Type 2 diabetes mellitus without complications: Secondary | ICD-10-CM | POA: Diagnosis not present

## 2024-01-04 DIAGNOSIS — R1310 Dysphagia, unspecified: Secondary | ICD-10-CM | POA: Diagnosis not present

## 2024-01-06 ENCOUNTER — Ambulatory Visit (HOSPITAL_COMMUNITY)
Admission: RE | Admit: 2024-01-06 | Discharge: 2024-01-06 | Disposition: A | Discharge status: Home / Self Care | Source: Ambulatory Visit | Attending: *Deleted | Admitting: *Deleted

## 2024-01-06 ENCOUNTER — Ambulatory Visit (HOSPITAL_COMMUNITY)
Admission: RE | Admit: 2024-01-06 | Discharge: 2024-01-06 | Disposition: A | Source: Ambulatory Visit | Attending: *Deleted | Admitting: *Deleted

## 2024-01-06 DIAGNOSIS — R1310 Dysphagia, unspecified: Secondary | ICD-10-CM | POA: Insufficient documentation

## 2024-01-06 DIAGNOSIS — R059 Cough, unspecified: Secondary | ICD-10-CM

## 2024-01-06 NOTE — Progress Notes (Signed)
 Modified Barium Swallow Study  Patient Details  Name: [name] [name] MRN: [id number] Date of Birth: 02-10-1946  Today's Date: 01/06/2024  Modified Barium Swallow completed.  Full report located under Chart Review in the Imaging Section.  History of Present Illness Pt is a 78 yo female referred for an OP MBS by ENT due to ongoing reports of difficulty swallowing. Per note, she reports she needs ot swallow multiple times to clear her food and has experienced significant weight loss in the past four years. Laryngoscopy 11/23/23 unremarkable with suspicion for small degree of vocal fold atrophy affecting reported changes in vocal quality. Most recently seen by SLP 03/20/22 with recommendations to continue regular/thin diet with esophageal precautions. Esophagram 03/25/22 showed mild dysmotility. EGD 05/25/23 revealed white lesions with localized inflammation s/p biopsy. PMH: T2DM, HTN, GERD, anemia, paroxysmal SVT, CKD 3, lymphedema, aphasia, cervical spine stenosis   Clinical Impression Pt presents with functional oropharyngeal swallowing. She has mild difficulty with oral transit and clearance secondary to absent upper dentition but compensates well and prefers to continue regular solids. No penetration/aspiration occurred throughout the duration of the study. Attempted to give the barium tablet cut in half with thin liquids and with purees, though she was unable to clear it from her oral cavity. Recommend she continue her current diet without ongoing SLP f/u. Reinforced education regarding esophageal precautions given history of dysmotility. Factors that may increase risk of adverse event in presence of aspiration [name] [name] 2021): Limited mobility  Swallow Evaluation Recommendations Recommendations: PO diet PO Diet Recommendation: Regular;Thin liquids (Level 0) Liquid Administration via: Cup;Straw Medication Administration: Whole meds with liquid Supervision: Staff to assist with  self-feeding Swallowing strategies  : Minimize environmental distractions;Slow rate;Small bites/sips Postural changes: Position pt fully upright for meals;Stay upright 30-60 min after meals Oral care recommendations: Oral care BID (2x/day)    Damien Blumenthal, M.A., CCC-SLP Speech Language Pathology, Acute Rehabilitation Services  Secure Chat preferred [phone]  01/06/2024,11:59 AM

## 2024-01-06 NOTE — Telephone Encounter (Signed)
 Error

## 2024-01-20 DIAGNOSIS — E119 Type 2 diabetes mellitus without complications: Secondary | ICD-10-CM | POA: Diagnosis not present

## 2024-01-20 DIAGNOSIS — N183 Chronic kidney disease, stage 3 unspecified: Secondary | ICD-10-CM | POA: Diagnosis not present

## 2024-01-20 DIAGNOSIS — I10 Essential (primary) hypertension: Secondary | ICD-10-CM | POA: Diagnosis not present

## 2024-01-26 DIAGNOSIS — B351 Tinea unguium: Secondary | ICD-10-CM | POA: Diagnosis not present

## 2024-01-26 DIAGNOSIS — E1159 Type 2 diabetes mellitus with other circulatory complications: Secondary | ICD-10-CM | POA: Diagnosis not present
